# Patient Record
Sex: Female | Born: 1960
Health system: Southern US, Community
[De-identification: ages and names within clinical notes are randomized; demographics above are authoritative.]

## PROBLEM LIST (undated history)

## (undated) DIAGNOSIS — F32A Depression, unspecified: Secondary | ICD-10-CM

## (undated) DIAGNOSIS — I480 Paroxysmal atrial fibrillation: Secondary | ICD-10-CM

## (undated) DIAGNOSIS — D171 Benign lipomatous neoplasm of skin and subcutaneous tissue of trunk: Secondary | ICD-10-CM

## (undated) DIAGNOSIS — R55 Syncope and collapse: Secondary | ICD-10-CM

## (undated) DIAGNOSIS — I639 Cerebral infarction, unspecified: Secondary | ICD-10-CM

## (undated) DIAGNOSIS — N3010 Interstitial cystitis (chronic) without hematuria: Secondary | ICD-10-CM

## (undated) DIAGNOSIS — R112 Nausea with vomiting, unspecified: Secondary | ICD-10-CM

## (undated) DIAGNOSIS — IMO0001 Reserved for inherently not codable concepts without codable children: Secondary | ICD-10-CM

## (undated) DIAGNOSIS — E079 Disorder of thyroid, unspecified: Secondary | ICD-10-CM

## (undated) DIAGNOSIS — Z8679 Personal history of other diseases of the circulatory system: Secondary | ICD-10-CM

## (undated) DIAGNOSIS — I471 Supraventricular tachycardia, unspecified: Secondary | ICD-10-CM

## (undated) DIAGNOSIS — K219 Gastro-esophageal reflux disease without esophagitis: Secondary | ICD-10-CM

## (undated) DIAGNOSIS — R011 Cardiac murmur, unspecified: Secondary | ICD-10-CM

## (undated) DIAGNOSIS — K5909 Other constipation: Secondary | ICD-10-CM

## (undated) DIAGNOSIS — C50919 Malignant neoplasm of unspecified site of unspecified female breast: Secondary | ICD-10-CM

## (undated) DIAGNOSIS — E785 Hyperlipidemia, unspecified: Secondary | ICD-10-CM

## (undated) DIAGNOSIS — M1990 Unspecified osteoarthritis, unspecified site: Secondary | ICD-10-CM

## (undated) DIAGNOSIS — Z0389 Encounter for observation for other suspected diseases and conditions ruled out: Secondary | ICD-10-CM

## (undated) DIAGNOSIS — C50012 Malignant neoplasm of nipple and areola, left female breast: Secondary | ICD-10-CM

## (undated) DIAGNOSIS — F419 Anxiety disorder, unspecified: Secondary | ICD-10-CM

## (undated) DIAGNOSIS — Z86711 Personal history of pulmonary embolism: Secondary | ICD-10-CM

## (undated) DIAGNOSIS — E039 Hypothyroidism, unspecified: Secondary | ICD-10-CM

## (undated) DIAGNOSIS — T884XXA Failed or difficult intubation, initial encounter: Secondary | ICD-10-CM

## (undated) DIAGNOSIS — Z8489 Family history of other specified conditions: Secondary | ICD-10-CM

## (undated) DIAGNOSIS — D509 Iron deficiency anemia, unspecified: Secondary | ICD-10-CM

## (undated) DIAGNOSIS — G5600 Carpal tunnel syndrome, unspecified upper limb: Secondary | ICD-10-CM

## (undated) DIAGNOSIS — G540 Brachial plexus disorders: Secondary | ICD-10-CM

## (undated) DIAGNOSIS — J189 Pneumonia, unspecified organism: Secondary | ICD-10-CM

## (undated) DIAGNOSIS — D689 Coagulation defect, unspecified: Secondary | ICD-10-CM

## (undated) DIAGNOSIS — G894 Chronic pain syndrome: Secondary | ICD-10-CM

## (undated) DIAGNOSIS — Z5189 Encounter for other specified aftercare: Secondary | ICD-10-CM

## (undated) DIAGNOSIS — D649 Anemia, unspecified: Secondary | ICD-10-CM

## (undated) DIAGNOSIS — G43909 Migraine, unspecified, not intractable, without status migrainosus: Secondary | ICD-10-CM

## (undated) DIAGNOSIS — C801 Malignant (primary) neoplasm, unspecified: Secondary | ICD-10-CM

## (undated) DIAGNOSIS — Z8614 Personal history of Methicillin resistant Staphylococcus aureus infection: Secondary | ICD-10-CM

## (undated) DIAGNOSIS — F329 Major depressive disorder, single episode, unspecified: Secondary | ICD-10-CM

## (undated) DIAGNOSIS — K432 Incisional hernia without obstruction or gangrene: Secondary | ICD-10-CM

## (undated) DIAGNOSIS — R002 Palpitations: Secondary | ICD-10-CM

## (undated) DIAGNOSIS — Z8673 Personal history of transient ischemic attack (TIA), and cerebral infarction without residual deficits: Secondary | ICD-10-CM

## (undated) DIAGNOSIS — Z973 Presence of spectacles and contact lenses: Secondary | ICD-10-CM

## (undated) DIAGNOSIS — Z9889 Other specified postprocedural states: Secondary | ICD-10-CM

## (undated) DIAGNOSIS — T7840XA Allergy, unspecified, initial encounter: Secondary | ICD-10-CM

## (undated) DIAGNOSIS — M797 Fibromyalgia: Secondary | ICD-10-CM

## (undated) DIAGNOSIS — Z95828 Presence of other vascular implants and grafts: Secondary | ICD-10-CM

## (undated) DIAGNOSIS — Z8742 Personal history of other diseases of the female genital tract: Secondary | ICD-10-CM

## (undated) HISTORY — PX: HERNIA REPAIR: SHX51

## (undated) HISTORY — DX: Cerebral infarction, unspecified: I63.9

## (undated) HISTORY — PX: SPINE SURGERY: SHX786

## (undated) HISTORY — PX: FRACTURE SURGERY: SHX138

## (undated) HISTORY — DX: Anemia, unspecified: D64.9

## (undated) HISTORY — DX: Coagulation defect, unspecified: D68.9

## (undated) HISTORY — PX: CARDIOVASCULAR STRESS TEST: SHX262

## (undated) HISTORY — DX: Unspecified osteoarthritis, unspecified site: M19.90

## (undated) HISTORY — DX: Brachial plexus disorders: G54.0

## (undated) HISTORY — DX: Cardiac murmur, unspecified: R01.1

## (undated) HISTORY — PX: COLON SURGERY: SHX602

## (undated) HISTORY — DX: Encounter for other specified aftercare: Z51.89

## (undated) HISTORY — PX: OTHER SURGICAL HISTORY: SHX169

## (undated) HISTORY — DX: Syncope and collapse: R55

## (undated) HISTORY — PX: CARDIAC CATHETERIZATION: SHX172

## (undated) HISTORY — DX: Encounter for observation for other suspected diseases and conditions ruled out: Z03.89

## (undated) HISTORY — DX: Interstitial cystitis (chronic) without hematuria: N30.10

## (undated) HISTORY — PX: CARPAL TUNNEL RELEASE: SHX101

## (undated) HISTORY — DX: Allergy, unspecified, initial encounter: T78.40XA

## (undated) HISTORY — PX: CARDIAC ELECTROPHYSIOLOGY STUDY AND ABLATION: SHX1294

## (undated) HISTORY — PX: VENA CAVA FILTER PLACEMENT: SUR1032

## (undated) HISTORY — PX: ANTERIOR CERVICAL DECOMP/DISCECTOMY FUSION: SHX1161

## (undated) HISTORY — PX: TOTAL COLECTOMY: SHX852

## (undated) HISTORY — PX: SMALL INTESTINE SURGERY: SHX150

## (undated) HISTORY — DX: Carpal tunnel syndrome, unspecified upper limb: G56.00

## (undated) HISTORY — PX: BREAST IMPLANT REMOVAL: SUR1101

## (undated) HISTORY — PX: ENTEROCUTANEOUS FISTULA CLOSURE: SHX1510

## (undated) HISTORY — DX: Fibromyalgia: M79.7

## (undated) HISTORY — PX: CHOLECYSTECTOMY: SHX55

## (undated) HISTORY — DX: Reserved for inherently not codable concepts without codable children: IMO0001

## (undated) HISTORY — DX: Malignant neoplasm of unspecified site of unspecified female breast: C50.919

## (undated) HISTORY — PX: BREAST SURGERY: SHX581

## (undated) HISTORY — PX: TUBAL LIGATION: SHX77

## (undated) HISTORY — PX: JOINT REPLACEMENT: SHX530

## (undated) HISTORY — PX: COSMETIC SURGERY: SHX468

---
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
## 1985-12-28 HISTORY — PX: ABDOMINAL HYSTERECTOMY: SHX81

## 1991-12-29 HISTORY — PX: BREAST ENHANCEMENT SURGERY: SHX7

## 1994-12-28 DIAGNOSIS — Z8679 Personal history of other diseases of the circulatory system: Secondary | ICD-10-CM

## 1994-12-28 HISTORY — DX: Personal history of other diseases of the circulatory system: Z86.79

## 1995-12-29 DIAGNOSIS — Z86711 Personal history of pulmonary embolism: Secondary | ICD-10-CM

## 1995-12-29 DIAGNOSIS — Z8673 Personal history of transient ischemic attack (TIA), and cerebral infarction without residual deficits: Secondary | ICD-10-CM

## 1995-12-29 HISTORY — DX: Personal history of pulmonary embolism: Z86.711

## 1995-12-29 HISTORY — DX: Personal history of transient ischemic attack (TIA), and cerebral infarction without residual deficits: Z86.73

## 1998-04-01 ENCOUNTER — Inpatient Hospital Stay (HOSPITAL_COMMUNITY): Admission: RE | Admit: 1998-04-01 | Discharge: 1998-04-03 | Payer: Self-pay | Admitting: *Deleted

## 1999-05-01 ENCOUNTER — Ambulatory Visit (HOSPITAL_COMMUNITY): Admission: RE | Admit: 1999-05-01 | Discharge: 1999-05-01 | Payer: Self-pay | Admitting: Internal Medicine

## 1999-05-01 ENCOUNTER — Encounter: Payer: Self-pay | Admitting: Internal Medicine

## 1999-06-07 ENCOUNTER — Inpatient Hospital Stay (HOSPITAL_COMMUNITY): Admission: EM | Admit: 1999-06-07 | Discharge: 1999-06-14 | Payer: Self-pay | Admitting: Plastic Surgery

## 1999-06-09 ENCOUNTER — Encounter: Payer: Self-pay | Admitting: Plastic Surgery

## 1999-06-10 ENCOUNTER — Encounter: Payer: Self-pay | Admitting: Plastic Surgery

## 1999-06-19 ENCOUNTER — Encounter: Payer: Self-pay | Admitting: Internal Medicine

## 1999-06-19 ENCOUNTER — Inpatient Hospital Stay (HOSPITAL_COMMUNITY): Admission: EM | Admit: 1999-06-19 | Discharge: 1999-06-25 | Payer: Self-pay | Admitting: Emergency Medicine

## 1999-06-19 ENCOUNTER — Encounter: Payer: Self-pay | Admitting: Emergency Medicine

## 1999-06-24 ENCOUNTER — Encounter: Payer: Self-pay | Admitting: Internal Medicine

## 1999-06-24 ENCOUNTER — Encounter: Payer: Self-pay | Admitting: Urology

## 2000-11-30 ENCOUNTER — Ambulatory Visit (HOSPITAL_COMMUNITY): Admission: RE | Admit: 2000-11-30 | Discharge: 2000-11-30 | Payer: Self-pay | Admitting: Internal Medicine

## 2000-11-30 ENCOUNTER — Encounter (INDEPENDENT_AMBULATORY_CARE_PROVIDER_SITE_OTHER): Payer: Self-pay | Admitting: Specialist

## 2001-03-04 ENCOUNTER — Encounter: Payer: Self-pay | Admitting: Urology

## 2001-03-11 ENCOUNTER — Inpatient Hospital Stay (HOSPITAL_COMMUNITY): Admission: RE | Admit: 2001-03-11 | Discharge: 2001-03-14 | Payer: Self-pay | Admitting: Urology

## 2001-07-04 ENCOUNTER — Encounter (HOSPITAL_COMMUNITY): Admission: RE | Admit: 2001-07-04 | Discharge: 2001-08-03 | Payer: Self-pay | Admitting: Internal Medicine

## 2001-07-04 ENCOUNTER — Encounter (INDEPENDENT_AMBULATORY_CARE_PROVIDER_SITE_OTHER): Payer: Self-pay | Admitting: Internal Medicine

## 2001-07-05 ENCOUNTER — Encounter (INDEPENDENT_AMBULATORY_CARE_PROVIDER_SITE_OTHER): Payer: Self-pay | Admitting: Internal Medicine

## 2001-07-06 ENCOUNTER — Encounter (INDEPENDENT_AMBULATORY_CARE_PROVIDER_SITE_OTHER): Payer: Self-pay | Admitting: Internal Medicine

## 2001-07-07 ENCOUNTER — Encounter (INDEPENDENT_AMBULATORY_CARE_PROVIDER_SITE_OTHER): Payer: Self-pay | Admitting: Internal Medicine

## 2001-07-08 ENCOUNTER — Encounter (INDEPENDENT_AMBULATORY_CARE_PROVIDER_SITE_OTHER): Payer: Self-pay | Admitting: Internal Medicine

## 2001-07-27 ENCOUNTER — Encounter: Payer: Self-pay | Admitting: *Deleted

## 2001-07-27 ENCOUNTER — Observation Stay (HOSPITAL_COMMUNITY): Admission: EM | Admit: 2001-07-27 | Discharge: 2001-07-28 | Payer: Self-pay | Admitting: Emergency Medicine

## 2001-07-28 ENCOUNTER — Encounter: Payer: Self-pay | Admitting: Family Medicine

## 2001-07-28 ENCOUNTER — Inpatient Hospital Stay (HOSPITAL_COMMUNITY): Admission: AD | Admit: 2001-07-28 | Discharge: 2001-07-30 | Payer: Self-pay | Admitting: Internal Medicine

## 2001-08-30 ENCOUNTER — Ambulatory Visit (HOSPITAL_COMMUNITY): Admission: RE | Admit: 2001-08-30 | Discharge: 2001-08-30 | Payer: Self-pay | Admitting: Cardiology

## 2001-08-30 ENCOUNTER — Encounter: Payer: Self-pay | Admitting: Cardiology

## 2001-09-06 ENCOUNTER — Encounter: Payer: Self-pay | Admitting: Internal Medicine

## 2001-09-06 ENCOUNTER — Ambulatory Visit (HOSPITAL_COMMUNITY): Admission: RE | Admit: 2001-09-06 | Discharge: 2001-09-06 | Payer: Self-pay | Admitting: Internal Medicine

## 2001-09-07 ENCOUNTER — Other Ambulatory Visit: Admission: RE | Admit: 2001-09-07 | Discharge: 2001-09-07 | Payer: Self-pay | Admitting: Family Medicine

## 2001-09-22 ENCOUNTER — Ambulatory Visit (HOSPITAL_COMMUNITY): Admission: RE | Admit: 2001-09-22 | Discharge: 2001-09-22 | Payer: Self-pay | Admitting: Family Medicine

## 2001-09-22 ENCOUNTER — Encounter: Payer: Self-pay | Admitting: Family Medicine

## 2001-12-29 ENCOUNTER — Ambulatory Visit: Admission: RE | Admit: 2001-12-29 | Discharge: 2001-12-29 | Payer: Self-pay | Admitting: Family Medicine

## 2002-01-20 ENCOUNTER — Ambulatory Visit (HOSPITAL_COMMUNITY): Admission: RE | Admit: 2002-01-20 | Discharge: 2002-01-20 | Payer: Self-pay | Admitting: Endocrinology

## 2002-01-23 ENCOUNTER — Ambulatory Visit (HOSPITAL_COMMUNITY): Admission: RE | Admit: 2002-01-23 | Discharge: 2002-01-23 | Payer: Self-pay | Admitting: Family Medicine

## 2002-01-23 ENCOUNTER — Encounter: Payer: Self-pay | Admitting: Family Medicine

## 2002-02-07 ENCOUNTER — Encounter: Payer: Self-pay | Admitting: Family Medicine

## 2002-02-07 ENCOUNTER — Ambulatory Visit (HOSPITAL_COMMUNITY): Admission: RE | Admit: 2002-02-07 | Discharge: 2002-02-07 | Payer: Self-pay | Admitting: Family Medicine

## 2002-03-07 ENCOUNTER — Inpatient Hospital Stay (HOSPITAL_COMMUNITY): Admission: RE | Admit: 2002-03-07 | Discharge: 2002-03-08 | Payer: Self-pay | Admitting: Neurological Surgery

## 2002-03-07 ENCOUNTER — Encounter: Payer: Self-pay | Admitting: Neurological Surgery

## 2002-06-06 ENCOUNTER — Encounter: Payer: Self-pay | Admitting: Family Medicine

## 2002-06-06 ENCOUNTER — Ambulatory Visit (HOSPITAL_COMMUNITY): Admission: RE | Admit: 2002-06-06 | Discharge: 2002-06-06 | Payer: Self-pay | Admitting: Unknown Physician Specialty

## 2002-06-15 ENCOUNTER — Ambulatory Visit (HOSPITAL_COMMUNITY): Admission: RE | Admit: 2002-06-15 | Discharge: 2002-06-15 | Payer: Self-pay | Admitting: Internal Medicine

## 2002-06-19 ENCOUNTER — Encounter: Payer: Self-pay | Admitting: Family Medicine

## 2002-06-19 ENCOUNTER — Ambulatory Visit (HOSPITAL_COMMUNITY): Admission: RE | Admit: 2002-06-19 | Discharge: 2002-06-19 | Payer: Self-pay | Admitting: Family Medicine

## 2002-06-22 ENCOUNTER — Encounter: Admission: RE | Admit: 2002-06-22 | Discharge: 2002-06-22 | Payer: Self-pay | Admitting: Family Medicine

## 2002-06-22 ENCOUNTER — Encounter: Payer: Self-pay | Admitting: Family Medicine

## 2002-08-02 ENCOUNTER — Inpatient Hospital Stay (HOSPITAL_COMMUNITY): Admission: AD | Admit: 2002-08-02 | Discharge: 2002-08-15 | Payer: Self-pay | Admitting: General Surgery

## 2002-08-03 ENCOUNTER — Encounter: Payer: Self-pay | Admitting: General Surgery

## 2002-08-08 ENCOUNTER — Encounter: Payer: Self-pay | Admitting: Family Medicine

## 2002-08-24 ENCOUNTER — Ambulatory Visit (HOSPITAL_COMMUNITY): Admission: RE | Admit: 2002-08-24 | Discharge: 2002-08-24 | Payer: Self-pay | Admitting: Family Medicine

## 2002-08-24 ENCOUNTER — Encounter: Payer: Self-pay | Admitting: Family Medicine

## 2002-11-05 ENCOUNTER — Emergency Department (HOSPITAL_COMMUNITY): Admission: EM | Admit: 2002-11-05 | Discharge: 2002-11-05 | Payer: Self-pay | Admitting: Emergency Medicine

## 2002-11-07 ENCOUNTER — Encounter: Payer: Self-pay | Admitting: Neurological Surgery

## 2002-11-07 ENCOUNTER — Ambulatory Visit (HOSPITAL_COMMUNITY): Admission: RE | Admit: 2002-11-07 | Discharge: 2002-11-07 | Payer: Self-pay | Admitting: Neurological Surgery

## 2003-03-18 ENCOUNTER — Emergency Department (HOSPITAL_COMMUNITY): Admission: EM | Admit: 2003-03-18 | Discharge: 2003-03-18 | Payer: Self-pay | Admitting: Internal Medicine

## 2003-03-22 ENCOUNTER — Emergency Department (HOSPITAL_COMMUNITY): Admission: EM | Admit: 2003-03-22 | Discharge: 2003-03-22 | Payer: Self-pay | Admitting: Emergency Medicine

## 2003-03-22 ENCOUNTER — Ambulatory Visit (HOSPITAL_COMMUNITY): Admission: RE | Admit: 2003-03-22 | Discharge: 2003-03-22 | Payer: Self-pay | Admitting: Family Medicine

## 2003-03-22 ENCOUNTER — Encounter: Payer: Self-pay | Admitting: Family Medicine

## 2003-03-22 ENCOUNTER — Encounter: Payer: Self-pay | Admitting: Emergency Medicine

## 2003-03-26 ENCOUNTER — Other Ambulatory Visit: Admission: RE | Admit: 2003-03-26 | Discharge: 2003-03-26 | Payer: Self-pay | Admitting: Obstetrics & Gynecology

## 2003-05-07 ENCOUNTER — Encounter: Payer: Self-pay | Admitting: Family Medicine

## 2003-05-07 ENCOUNTER — Ambulatory Visit (HOSPITAL_COMMUNITY): Admission: RE | Admit: 2003-05-07 | Discharge: 2003-05-07 | Payer: Self-pay | Admitting: Family Medicine

## 2003-05-13 ENCOUNTER — Encounter: Payer: Self-pay | Admitting: Obstetrics & Gynecology

## 2003-05-13 ENCOUNTER — Inpatient Hospital Stay (HOSPITAL_COMMUNITY): Admission: AD | Admit: 2003-05-13 | Discharge: 2003-05-13 | Payer: Self-pay | Admitting: Obstetrics & Gynecology

## 2003-05-24 ENCOUNTER — Emergency Department (HOSPITAL_COMMUNITY): Admission: EM | Admit: 2003-05-24 | Discharge: 2003-05-24 | Payer: Self-pay | Admitting: Emergency Medicine

## 2003-05-24 ENCOUNTER — Encounter: Payer: Self-pay | Admitting: Emergency Medicine

## 2003-05-31 ENCOUNTER — Ambulatory Visit (HOSPITAL_COMMUNITY): Admission: RE | Admit: 2003-05-31 | Discharge: 2003-05-31 | Payer: Self-pay | Admitting: Internal Medicine

## 2003-05-31 ENCOUNTER — Encounter (INDEPENDENT_AMBULATORY_CARE_PROVIDER_SITE_OTHER): Payer: Self-pay | Admitting: Internal Medicine

## 2003-06-05 ENCOUNTER — Encounter: Payer: Self-pay | Admitting: Family Medicine

## 2003-06-05 ENCOUNTER — Ambulatory Visit (HOSPITAL_COMMUNITY): Admission: RE | Admit: 2003-06-05 | Discharge: 2003-06-05 | Payer: Self-pay | Admitting: Family Medicine

## 2003-06-08 ENCOUNTER — Ambulatory Visit (HOSPITAL_COMMUNITY): Admission: RE | Admit: 2003-06-08 | Discharge: 2003-06-08 | Payer: Self-pay | Admitting: Internal Medicine

## 2003-06-08 ENCOUNTER — Encounter (INDEPENDENT_AMBULATORY_CARE_PROVIDER_SITE_OTHER): Payer: Self-pay | Admitting: Internal Medicine

## 2003-06-11 ENCOUNTER — Encounter (HOSPITAL_COMMUNITY): Admission: RE | Admit: 2003-06-11 | Discharge: 2003-07-11 | Payer: Self-pay | Admitting: *Deleted

## 2003-06-11 ENCOUNTER — Encounter: Payer: Self-pay | Admitting: *Deleted

## 2003-07-03 ENCOUNTER — Inpatient Hospital Stay (HOSPITAL_COMMUNITY): Admission: AD | Admit: 2003-07-03 | Discharge: 2003-07-10 | Payer: Self-pay | Admitting: Internal Medicine

## 2003-07-04 ENCOUNTER — Encounter: Payer: Self-pay | Admitting: Internal Medicine

## 2003-07-06 ENCOUNTER — Encounter: Payer: Self-pay | Admitting: Family Medicine

## 2003-07-17 ENCOUNTER — Emergency Department (HOSPITAL_COMMUNITY): Admission: EM | Admit: 2003-07-17 | Discharge: 2003-07-17 | Payer: Self-pay | Admitting: Emergency Medicine

## 2003-07-17 ENCOUNTER — Encounter: Payer: Self-pay | Admitting: Emergency Medicine

## 2003-08-29 ENCOUNTER — Ambulatory Visit (HOSPITAL_COMMUNITY): Admission: RE | Admit: 2003-08-29 | Discharge: 2003-08-29 | Payer: Self-pay | Admitting: Obstetrics & Gynecology

## 2003-08-29 ENCOUNTER — Encounter: Payer: Self-pay | Admitting: Obstetrics & Gynecology

## 2004-04-23 ENCOUNTER — Ambulatory Visit (HOSPITAL_COMMUNITY): Admission: RE | Admit: 2004-04-23 | Discharge: 2004-04-23 | Payer: Self-pay | Admitting: Family Medicine

## 2004-05-15 ENCOUNTER — Encounter: Admission: RE | Admit: 2004-05-15 | Discharge: 2004-05-15 | Payer: Self-pay | Admitting: Family Medicine

## 2004-06-02 ENCOUNTER — Encounter: Admission: RE | Admit: 2004-06-02 | Discharge: 2004-06-02 | Payer: Self-pay | Admitting: Family Medicine

## 2004-06-05 ENCOUNTER — Other Ambulatory Visit: Admission: RE | Admit: 2004-06-05 | Discharge: 2004-06-05 | Payer: Self-pay | Admitting: Obstetrics & Gynecology

## 2004-07-09 ENCOUNTER — Encounter (HOSPITAL_COMMUNITY): Admission: RE | Admit: 2004-07-09 | Discharge: 2004-07-15 | Payer: Self-pay | Admitting: Family Medicine

## 2004-07-19 ENCOUNTER — Emergency Department (HOSPITAL_COMMUNITY): Admission: EM | Admit: 2004-07-19 | Discharge: 2004-07-20 | Payer: Self-pay | Admitting: Emergency Medicine

## 2004-07-19 ENCOUNTER — Emergency Department (HOSPITAL_COMMUNITY): Admission: EM | Admit: 2004-07-19 | Discharge: 2004-07-19 | Payer: Self-pay | Admitting: Emergency Medicine

## 2004-07-20 ENCOUNTER — Inpatient Hospital Stay (HOSPITAL_COMMUNITY): Admission: EM | Admit: 2004-07-20 | Discharge: 2004-07-29 | Payer: Self-pay | Admitting: Emergency Medicine

## 2004-07-26 ENCOUNTER — Encounter: Payer: Self-pay | Admitting: Internal Medicine

## 2004-08-12 ENCOUNTER — Emergency Department (HOSPITAL_COMMUNITY): Admission: EM | Admit: 2004-08-12 | Discharge: 2004-08-12 | Payer: Self-pay | Admitting: Emergency Medicine

## 2004-08-13 ENCOUNTER — Emergency Department (HOSPITAL_COMMUNITY): Admission: EM | Admit: 2004-08-13 | Discharge: 2004-08-13 | Payer: Self-pay | Admitting: Emergency Medicine

## 2004-08-13 ENCOUNTER — Encounter: Admission: RE | Admit: 2004-08-13 | Discharge: 2004-08-13 | Payer: Self-pay | Admitting: Internal Medicine

## 2004-09-17 ENCOUNTER — Ambulatory Visit (HOSPITAL_COMMUNITY): Admission: RE | Admit: 2004-09-17 | Discharge: 2004-09-17 | Payer: Self-pay | Admitting: Family Medicine

## 2004-11-06 ENCOUNTER — Encounter: Admission: RE | Admit: 2004-11-06 | Discharge: 2004-11-06 | Payer: Self-pay | Admitting: Obstetrics and Gynecology

## 2004-11-23 ENCOUNTER — Emergency Department (HOSPITAL_COMMUNITY): Admission: EM | Admit: 2004-11-23 | Discharge: 2004-11-23 | Payer: Self-pay | Admitting: Emergency Medicine

## 2004-11-24 ENCOUNTER — Emergency Department (HOSPITAL_COMMUNITY): Admission: EM | Admit: 2004-11-24 | Discharge: 2004-11-24 | Payer: Self-pay | Admitting: Emergency Medicine

## 2004-11-27 ENCOUNTER — Emergency Department (HOSPITAL_COMMUNITY): Admission: EM | Admit: 2004-11-27 | Discharge: 2004-11-27 | Payer: Self-pay | Admitting: Emergency Medicine

## 2004-12-01 ENCOUNTER — Ambulatory Visit: Payer: Self-pay | Admitting: Family Medicine

## 2004-12-02 ENCOUNTER — Ambulatory Visit: Payer: Self-pay | Admitting: Infectious Diseases

## 2004-12-28 HISTORY — PX: AUGMENTATION MAMMAPLASTY: SUR837

## 2004-12-28 HISTORY — PX: MASTECTOMY: SHX3

## 2005-01-01 ENCOUNTER — Ambulatory Visit: Payer: Self-pay | Admitting: Internal Medicine

## 2005-01-02 ENCOUNTER — Ambulatory Visit: Payer: Self-pay | Admitting: *Deleted

## 2005-01-11 ENCOUNTER — Emergency Department (HOSPITAL_COMMUNITY): Admission: EM | Admit: 2005-01-11 | Discharge: 2005-01-11 | Payer: Self-pay | Admitting: Emergency Medicine

## 2005-01-19 ENCOUNTER — Ambulatory Visit: Payer: Self-pay | Admitting: Family Medicine

## 2005-03-05 ENCOUNTER — Ambulatory Visit: Payer: Self-pay | Admitting: *Deleted

## 2005-03-05 ENCOUNTER — Ambulatory Visit: Payer: Self-pay | Admitting: Family Medicine

## 2005-03-09 ENCOUNTER — Ambulatory Visit: Payer: Self-pay | Admitting: *Deleted

## 2005-03-10 ENCOUNTER — Ambulatory Visit: Payer: Self-pay | Admitting: Cardiovascular Disease

## 2005-03-12 ENCOUNTER — Ambulatory Visit: Payer: Self-pay | Admitting: Cardiology

## 2005-03-13 ENCOUNTER — Ambulatory Visit: Payer: Self-pay | Admitting: Cardiology

## 2005-03-17 ENCOUNTER — Ambulatory Visit: Payer: Self-pay | Admitting: Cardiology

## 2005-03-30 ENCOUNTER — Ambulatory Visit: Payer: Self-pay | Admitting: Family Medicine

## 2005-03-31 ENCOUNTER — Ambulatory Visit (HOSPITAL_COMMUNITY): Admission: RE | Admit: 2005-03-31 | Discharge: 2005-03-31 | Payer: Self-pay | Admitting: Family Medicine

## 2005-04-16 ENCOUNTER — Ambulatory Visit: Payer: Self-pay | Admitting: *Deleted

## 2005-04-20 ENCOUNTER — Encounter: Admission: RE | Admit: 2005-04-20 | Discharge: 2005-04-20 | Payer: Self-pay | Admitting: Oncology

## 2005-04-20 ENCOUNTER — Ambulatory Visit (HOSPITAL_COMMUNITY): Admission: RE | Admit: 2005-04-20 | Discharge: 2005-04-20 | Payer: Self-pay | Admitting: Family Medicine

## 2005-04-20 ENCOUNTER — Encounter (HOSPITAL_COMMUNITY): Admission: RE | Admit: 2005-04-20 | Discharge: 2005-05-20 | Payer: Self-pay | Admitting: Oncology

## 2005-04-20 ENCOUNTER — Ambulatory Visit (HOSPITAL_COMMUNITY): Payer: Self-pay | Admitting: Oncology

## 2005-04-27 ENCOUNTER — Ambulatory Visit: Payer: Self-pay | Admitting: *Deleted

## 2005-05-08 DIAGNOSIS — Z95828 Presence of other vascular implants and grafts: Secondary | ICD-10-CM

## 2005-05-08 HISTORY — DX: Presence of other vascular implants and grafts: Z95.828

## 2005-05-18 ENCOUNTER — Ambulatory Visit: Payer: Self-pay | Admitting: *Deleted

## 2005-06-04 ENCOUNTER — Ambulatory Visit: Payer: Self-pay | Admitting: Family Medicine

## 2005-07-06 ENCOUNTER — Encounter: Admission: RE | Admit: 2005-07-06 | Discharge: 2005-07-06 | Payer: Self-pay | Admitting: Oncology

## 2005-07-06 ENCOUNTER — Encounter (HOSPITAL_COMMUNITY): Admission: RE | Admit: 2005-07-06 | Discharge: 2005-08-05 | Payer: Self-pay | Admitting: Oncology

## 2005-07-06 ENCOUNTER — Ambulatory Visit (HOSPITAL_COMMUNITY): Payer: Self-pay | Admitting: Oncology

## 2005-07-09 ENCOUNTER — Ambulatory Visit: Payer: Self-pay | Admitting: Family Medicine

## 2005-08-02 ENCOUNTER — Emergency Department (HOSPITAL_COMMUNITY): Admission: EM | Admit: 2005-08-02 | Discharge: 2005-08-02 | Payer: Self-pay | Admitting: Emergency Medicine

## 2005-08-26 ENCOUNTER — Ambulatory Visit: Payer: Self-pay | Admitting: Family Medicine

## 2005-08-27 ENCOUNTER — Ambulatory Visit: Payer: Self-pay | Admitting: Orthopedic Surgery

## 2005-09-21 ENCOUNTER — Inpatient Hospital Stay (HOSPITAL_COMMUNITY): Admission: EM | Admit: 2005-09-21 | Discharge: 2005-09-26 | Payer: Self-pay | Admitting: Emergency Medicine

## 2005-09-22 ENCOUNTER — Encounter (INDEPENDENT_AMBULATORY_CARE_PROVIDER_SITE_OTHER): Payer: Self-pay | Admitting: General Surgery

## 2005-10-09 ENCOUNTER — Ambulatory Visit: Payer: Self-pay | Admitting: Family Medicine

## 2005-10-27 ENCOUNTER — Ambulatory Visit: Payer: Self-pay | Admitting: Family Medicine

## 2005-11-02 ENCOUNTER — Ambulatory Visit: Payer: Self-pay | Admitting: Family Medicine

## 2005-11-30 ENCOUNTER — Ambulatory Visit: Payer: Self-pay | Admitting: Family Medicine

## 2005-12-14 ENCOUNTER — Ambulatory Visit: Payer: Self-pay | Admitting: Infectious Diseases

## 2005-12-15 ENCOUNTER — Encounter: Admission: RE | Admit: 2005-12-15 | Discharge: 2005-12-15 | Payer: Self-pay | Admitting: Family Medicine

## 2005-12-18 ENCOUNTER — Ambulatory Visit: Payer: Self-pay | Admitting: Family Medicine

## 2006-02-10 ENCOUNTER — Emergency Department (HOSPITAL_COMMUNITY): Admission: EM | Admit: 2006-02-10 | Discharge: 2006-02-10 | Payer: Self-pay | Admitting: Emergency Medicine

## 2006-04-06 ENCOUNTER — Ambulatory Visit: Payer: Self-pay | Admitting: Family Medicine

## 2006-04-06 ENCOUNTER — Encounter: Payer: Self-pay | Admitting: Family Medicine

## 2006-04-06 ENCOUNTER — Other Ambulatory Visit: Admission: RE | Admit: 2006-04-06 | Discharge: 2006-04-06 | Payer: Self-pay | Admitting: Family Medicine

## 2006-04-15 ENCOUNTER — Ambulatory Visit: Payer: Self-pay | Admitting: Cardiology

## 2006-04-28 ENCOUNTER — Encounter: Payer: Self-pay | Admitting: Emergency Medicine

## 2006-05-04 ENCOUNTER — Ambulatory Visit: Payer: Self-pay | Admitting: *Deleted

## 2006-05-18 ENCOUNTER — Ambulatory Visit: Payer: Self-pay | Admitting: Family Medicine

## 2006-05-21 ENCOUNTER — Ambulatory Visit (HOSPITAL_COMMUNITY): Admission: RE | Admit: 2006-05-21 | Discharge: 2006-05-21 | Payer: Self-pay | Admitting: Family Medicine

## 2006-05-22 ENCOUNTER — Emergency Department (HOSPITAL_COMMUNITY): Admission: EM | Admit: 2006-05-22 | Discharge: 2006-05-22 | Payer: Self-pay | Admitting: Emergency Medicine

## 2006-06-02 ENCOUNTER — Ambulatory Visit: Payer: Self-pay | Admitting: Family Medicine

## 2006-08-12 ENCOUNTER — Ambulatory Visit: Payer: Self-pay | Admitting: Family Medicine

## 2006-08-19 ENCOUNTER — Ambulatory Visit (HOSPITAL_COMMUNITY): Admission: RE | Admit: 2006-08-19 | Discharge: 2006-08-19 | Payer: Self-pay | Admitting: Family Medicine

## 2006-09-03 ENCOUNTER — Ambulatory Visit: Payer: Self-pay | Admitting: Family Medicine

## 2006-09-20 ENCOUNTER — Ambulatory Visit: Payer: Self-pay | Admitting: Family Medicine

## 2006-11-22 ENCOUNTER — Ambulatory Visit: Payer: Self-pay | Admitting: Family Medicine

## 2006-12-16 ENCOUNTER — Ambulatory Visit: Payer: Self-pay | Admitting: Internal Medicine

## 2007-01-06 ENCOUNTER — Ambulatory Visit: Payer: Self-pay | Admitting: Family Medicine

## 2007-01-06 LAB — CONVERTED CEMR LAB
Albumin: 4 g/dL (ref 3.5–5.2)
Basophils Absolute: 0 10*3/uL (ref 0.0–0.1)
CO2: 26 meq/L (ref 19–32)
Chloride: 105 meq/L (ref 96–112)
Ferritin: 7 ng/mL — ABNORMAL LOW (ref 10–291)
Folate: 8.5 ng/mL
HDL: 30 mg/dL — ABNORMAL LOW (ref >39)
Hemoglobin: 12.1 g/dL (ref 12.0–15.0)
Lymphocytes Relative: 35 % (ref 12–46)
Lymphs Abs: 2.2 10*3/uL (ref 0.7–3.3)
Monocytes Absolute: 0.5 10*3/uL (ref 0.2–0.7)
Neutro Abs: 3.5 10*3/uL (ref 1.7–7.7)
Platelets: 241 10*3/uL (ref 150–400)
RDW: 15.2 % — ABNORMAL HIGH (ref 11.5–14.0)
Retic Ct Pct: 1.5 % (ref 0.4–3.1)
Sodium: 139 meq/L (ref 135–145)
TIBC: 336 ug/dL (ref 250–470)
TSH: 2.849 microintl units/mL (ref 0.350–5.50)
Total Bilirubin: 0.2 mg/dL — ABNORMAL LOW (ref 0.3–1.2)
Total CHOL/HDL Ratio: 5.8
WBC: 6.4 10*3/uL (ref 4.0–10.5)

## 2007-01-07 ENCOUNTER — Encounter: Payer: Self-pay | Admitting: Family Medicine

## 2007-01-25 ENCOUNTER — Encounter: Admission: RE | Admit: 2007-01-25 | Discharge: 2007-01-25 | Payer: Self-pay | Admitting: Internal Medicine

## 2007-04-20 ENCOUNTER — Ambulatory Visit: Payer: Self-pay | Admitting: Family Medicine

## 2007-04-22 ENCOUNTER — Encounter: Payer: Self-pay | Admitting: Family Medicine

## 2007-04-22 LAB — CONVERTED CEMR LAB
Candida species: NEGATIVE
Chlamydia, DNA Probe: NEGATIVE
GC Probe Amp, Genital: NEGATIVE

## 2007-04-25 ENCOUNTER — Encounter: Payer: Self-pay | Admitting: Family Medicine

## 2007-04-25 LAB — CONVERTED CEMR LAB
ALT: 22 units/L (ref 0–35)
BUN: 13 mg/dL (ref 6–23)
Basophils Relative: 0 % (ref 0–1)
Cholesterol: 134 mg/dL (ref 0–200)
Eosinophils Absolute: 0.1 10*3/uL (ref 0.0–0.7)
MCHC: 30.8 g/dL (ref 30.0–36.0)
MCV: 81.6 fL (ref 78.0–100.0)
Monocytes Relative: 7 % (ref 3–11)
Neutrophils Relative %: 55 % (ref 43–77)
Platelets: 267 10*3/uL (ref 150–400)
Potassium: 4.2 meq/L (ref 3.5–5.3)
Sodium: 143 meq/L (ref 135–145)
Total Protein: 6.5 g/dL (ref 6.0–8.3)
Triglycerides: 252 mg/dL — ABNORMAL HIGH (ref <150)
VLDL: 50 mg/dL — ABNORMAL HIGH (ref 0–40)

## 2007-04-26 ENCOUNTER — Encounter: Payer: Self-pay | Admitting: Family Medicine

## 2007-04-26 LAB — CONVERTED CEMR LAB
Ferritin: 6 ng/mL — ABNORMAL LOW (ref 10–291)
Folate: 16.7 ng/mL
Retic Ct Pct: 0.8 % (ref 0.4–3.1)
Saturation Ratios: 8 % — ABNORMAL LOW (ref 20–55)
TIBC: 349 ug/dL (ref 250–470)
Vitamin B-12: 361 pg/mL (ref 211–911)

## 2007-05-25 ENCOUNTER — Emergency Department (HOSPITAL_COMMUNITY): Admission: EM | Admit: 2007-05-25 | Discharge: 2007-05-25 | Payer: Self-pay | Admitting: Emergency Medicine

## 2007-05-27 ENCOUNTER — Ambulatory Visit: Payer: Self-pay | Admitting: Cardiology

## 2007-05-30 ENCOUNTER — Ambulatory Visit: Payer: Self-pay | Admitting: Family Medicine

## 2007-05-31 ENCOUNTER — Encounter: Payer: Self-pay | Admitting: Family Medicine

## 2007-05-31 LAB — CONVERTED CEMR LAB
Ferritin: 4 ng/mL — ABNORMAL LOW (ref 10–291)
Folate: 14.2 ng/mL
Helicobacter Pylori Antibody-IgG: <0.4
Lymphs Abs: 2.1 10*3/uL (ref 0.7–3.3)
Monocytes Relative: 8 % (ref 3–11)
Neutro Abs: 4.8 10*3/uL (ref 1.7–7.7)
Neutrophils Relative %: 63 % (ref 43–77)
RBC: 4.93 M/uL (ref 3.87–5.11)
Vitamin B-12: >2000 pg/mL — ABNORMAL HIGH (ref 211–911)
WBC: 7.7 10*3/uL (ref 4.0–10.5)

## 2007-06-23 ENCOUNTER — Ambulatory Visit: Payer: Self-pay | Admitting: Family Medicine

## 2007-06-28 ENCOUNTER — Encounter: Payer: Self-pay | Admitting: Family Medicine

## 2007-06-28 LAB — CONVERTED CEMR LAB
CO2: 28 meq/L (ref 19–32)
Glucose, Bld: 115 mg/dL — ABNORMAL HIGH (ref 70–99)
Potassium: 4.7 meq/L (ref 3.5–5.3)
Sodium: 139 meq/L (ref 135–145)

## 2007-07-07 ENCOUNTER — Ambulatory Visit: Payer: Self-pay | Admitting: Internal Medicine

## 2007-07-11 ENCOUNTER — Ambulatory Visit: Payer: Self-pay | Admitting: Internal Medicine

## 2007-07-13 ENCOUNTER — Encounter: Payer: Self-pay | Admitting: Internal Medicine

## 2007-07-13 ENCOUNTER — Ambulatory Visit (HOSPITAL_COMMUNITY): Admission: RE | Admit: 2007-07-13 | Discharge: 2007-07-13 | Payer: Self-pay | Admitting: Internal Medicine

## 2007-07-13 ENCOUNTER — Ambulatory Visit: Payer: Self-pay | Admitting: Internal Medicine

## 2007-07-26 ENCOUNTER — Ambulatory Visit (HOSPITAL_COMMUNITY): Admission: RE | Admit: 2007-07-26 | Discharge: 2007-07-26 | Payer: Self-pay | Admitting: Internal Medicine

## 2007-07-27 ENCOUNTER — Ambulatory Visit (HOSPITAL_COMMUNITY): Admission: RE | Admit: 2007-07-27 | Discharge: 2007-07-27 | Payer: Self-pay | Admitting: Internal Medicine

## 2007-07-29 ENCOUNTER — Ambulatory Visit (HOSPITAL_COMMUNITY): Admission: RE | Admit: 2007-07-29 | Discharge: 2007-07-29 | Payer: Self-pay | Admitting: Internal Medicine

## 2007-08-01 ENCOUNTER — Ambulatory Visit (HOSPITAL_COMMUNITY): Admission: RE | Admit: 2007-08-01 | Discharge: 2007-08-01 | Payer: Self-pay | Admitting: Internal Medicine

## 2007-08-02 ENCOUNTER — Ambulatory Visit (HOSPITAL_COMMUNITY): Admission: RE | Admit: 2007-08-02 | Discharge: 2007-08-02 | Payer: Self-pay | Admitting: Internal Medicine

## 2007-08-03 ENCOUNTER — Ambulatory Visit: Payer: Self-pay | Admitting: Family Medicine

## 2007-08-03 LAB — CONVERTED CEMR LAB
Eosinophils Absolute: 0.1 10*3/uL (ref 0.0–0.7)
Eosinophils Relative: 1 % (ref 0–5)
Folate: >20 ng/mL
HCT: 39.2 % (ref 36.0–46.0)
Hemoglobin, Urine: NEGATIVE
Hemoglobin: 13.1 g/dL (ref 12.0–15.0)
Iron: 144 ug/dL (ref 42–145)
Ketones, ur: NEGATIVE mg/dL
Leukocytes, UA: NEGATIVE
Lymphs Abs: 2.3 10*3/uL (ref 0.7–3.3)
MCV: 82.1 fL (ref 78.0–100.0)
Monocytes Absolute: 0.6 10*3/uL (ref 0.2–0.7)
Platelets: 226 10*3/uL (ref 150–400)
Protein, ur: NEGATIVE mg/dL
RDW: 19 % — ABNORMAL HIGH (ref 11.5–14.0)
Saturation Ratios: 39 % (ref 20–55)
TIBC: 373 ug/dL (ref 250–470)
UIBC: 229 ug/dL
Urine Glucose: NEGATIVE mg/dL
Vitamin B-12: 450 pg/mL (ref 211–911)
WBC: 7.2 10*3/uL (ref 4.0–10.5)
pH: 6.5 (ref 5.0–8.0)

## 2007-08-05 ENCOUNTER — Ambulatory Visit (HOSPITAL_COMMUNITY): Admission: RE | Admit: 2007-08-05 | Discharge: 2007-08-05 | Payer: Self-pay | Admitting: Gastroenterology

## 2007-08-18 ENCOUNTER — Ambulatory Visit: Payer: Self-pay | Admitting: Internal Medicine

## 2007-08-18 ENCOUNTER — Ambulatory Visit (HOSPITAL_COMMUNITY): Admission: RE | Admit: 2007-08-18 | Discharge: 2007-08-18 | Payer: Self-pay | Admitting: Internal Medicine

## 2007-08-26 ENCOUNTER — Ambulatory Visit (HOSPITAL_COMMUNITY): Admission: RE | Admit: 2007-08-26 | Discharge: 2007-08-26 | Payer: Self-pay | Admitting: Internal Medicine

## 2007-09-05 ENCOUNTER — Ambulatory Visit: Payer: Self-pay | Admitting: Family Medicine

## 2007-09-05 LAB — CONVERTED CEMR LAB
Bilirubin Urine: NEGATIVE
Hemoglobin, Urine: NEGATIVE
Leukocytes, UA: NEGATIVE
RBC / HPF: NONE SEEN (ref <3)
Urine Glucose: NEGATIVE mg/dL
pH: 6 (ref 5.0–8.0)

## 2007-09-06 ENCOUNTER — Encounter: Payer: Self-pay | Admitting: Family Medicine

## 2007-09-13 ENCOUNTER — Ambulatory Visit: Payer: Self-pay | Admitting: Family Medicine

## 2007-09-21 ENCOUNTER — Ambulatory Visit (HOSPITAL_COMMUNITY): Admission: RE | Admit: 2007-09-21 | Discharge: 2007-09-21 | Payer: Self-pay | Admitting: Family Medicine

## 2007-11-16 ENCOUNTER — Ambulatory Visit: Payer: Self-pay | Admitting: Family Medicine

## 2007-11-18 ENCOUNTER — Encounter: Payer: Self-pay | Admitting: Family Medicine

## 2007-11-22 ENCOUNTER — Encounter: Payer: Self-pay | Admitting: Family Medicine

## 2007-11-22 LAB — CONVERTED CEMR LAB
AST: 14 units/L (ref 0–37)
Albumin: 4.4 g/dL (ref 3.5–5.2)
Alkaline Phosphatase: 82 units/L (ref 39–117)
Chloride: 104 meq/L (ref 96–112)
Creatinine, Ser: 0.86 mg/dL (ref 0.40–1.20)
HDL: 35 mg/dL — ABNORMAL LOW (ref >39)
LDL Cholesterol: 96 mg/dL (ref 0–99)
Sodium: 139 meq/L (ref 135–145)
Total Protein: 6.9 g/dL (ref 6.0–8.3)
Triglycerides: 217 mg/dL — ABNORMAL HIGH (ref <150)

## 2007-12-29 ENCOUNTER — Encounter: Payer: Self-pay | Admitting: Family Medicine

## 2008-01-05 ENCOUNTER — Ambulatory Visit: Payer: Self-pay | Admitting: Family Medicine

## 2008-02-02 ENCOUNTER — Encounter: Admission: RE | Admit: 2008-02-02 | Discharge: 2008-02-02 | Payer: Self-pay | Admitting: Family Medicine

## 2008-03-09 ENCOUNTER — Ambulatory Visit: Payer: Self-pay | Admitting: Family Medicine

## 2008-03-16 ENCOUNTER — Encounter: Payer: Self-pay | Admitting: Family Medicine

## 2008-03-16 DIAGNOSIS — M549 Dorsalgia, unspecified: Secondary | ICD-10-CM | POA: Insufficient documentation

## 2008-03-16 DIAGNOSIS — R5383 Other fatigue: Secondary | ICD-10-CM

## 2008-03-16 DIAGNOSIS — R52 Pain, unspecified: Secondary | ICD-10-CM | POA: Insufficient documentation

## 2008-03-16 DIAGNOSIS — F411 Generalized anxiety disorder: Secondary | ICD-10-CM

## 2008-03-16 DIAGNOSIS — M542 Cervicalgia: Secondary | ICD-10-CM

## 2008-03-16 DIAGNOSIS — C50012 Malignant neoplasm of nipple and areola, left female breast: Secondary | ICD-10-CM

## 2008-03-16 DIAGNOSIS — R5381 Other malaise: Secondary | ICD-10-CM

## 2008-03-16 DIAGNOSIS — E785 Hyperlipidemia, unspecified: Secondary | ICD-10-CM | POA: Insufficient documentation

## 2008-03-16 DIAGNOSIS — IMO0001 Reserved for inherently not codable concepts without codable children: Secondary | ICD-10-CM

## 2008-03-16 DIAGNOSIS — E669 Obesity, unspecified: Secondary | ICD-10-CM

## 2008-03-16 DIAGNOSIS — M797 Fibromyalgia: Secondary | ICD-10-CM | POA: Insufficient documentation

## 2008-03-16 DIAGNOSIS — E039 Hypothyroidism, unspecified: Secondary | ICD-10-CM | POA: Insufficient documentation

## 2008-03-16 HISTORY — DX: Other malaise: R53.81

## 2008-03-16 HISTORY — DX: Cervicalgia: M54.2

## 2008-03-16 HISTORY — DX: Obesity, unspecified: E66.9

## 2008-03-16 HISTORY — DX: Hyperlipidemia, unspecified: E78.5

## 2008-03-16 HISTORY — DX: Other malaise: R53.83

## 2008-03-16 HISTORY — DX: Reserved for inherently not codable concepts without codable children: IMO0001

## 2008-03-16 HISTORY — DX: Generalized anxiety disorder: F41.1

## 2008-03-16 HISTORY — DX: Malignant neoplasm of nipple and areola, left female breast: C50.012

## 2008-04-04 ENCOUNTER — Ambulatory Visit: Payer: Self-pay | Admitting: Family Medicine

## 2008-04-05 ENCOUNTER — Encounter: Payer: Self-pay | Admitting: Family Medicine

## 2008-04-05 LAB — CONVERTED CEMR LAB
ALT: 12 units/L (ref 0–35)
Alkaline Phosphatase: 89 units/L (ref 39–117)
Folate: 18.2 ng/mL
Total Protein: 7.3 g/dL (ref 6.0–8.3)

## 2008-04-11 ENCOUNTER — Ambulatory Visit (HOSPITAL_COMMUNITY): Admission: RE | Admit: 2008-04-11 | Discharge: 2008-04-11 | Payer: Self-pay | Admitting: Family Medicine

## 2008-04-11 ENCOUNTER — Encounter: Payer: Self-pay | Admitting: Family Medicine

## 2008-04-27 ENCOUNTER — Ambulatory Visit (HOSPITAL_COMMUNITY): Payer: Self-pay | Admitting: Oncology

## 2008-06-11 ENCOUNTER — Ambulatory Visit: Payer: Self-pay | Admitting: Cardiology

## 2008-06-11 ENCOUNTER — Ambulatory Visit: Payer: Self-pay | Admitting: Family Medicine

## 2008-06-12 ENCOUNTER — Encounter: Payer: Self-pay | Admitting: Family Medicine

## 2008-07-02 ENCOUNTER — Ambulatory Visit (HOSPITAL_COMMUNITY): Payer: Self-pay | Admitting: Internal Medicine

## 2008-07-02 ENCOUNTER — Encounter (HOSPITAL_COMMUNITY): Admission: RE | Admit: 2008-07-02 | Discharge: 2008-08-01 | Payer: Self-pay | Admitting: Internal Medicine

## 2008-07-04 ENCOUNTER — Ambulatory Visit (HOSPITAL_COMMUNITY): Payer: Self-pay | Admitting: Oncology

## 2008-07-09 ENCOUNTER — Ambulatory Visit (HOSPITAL_COMMUNITY): Payer: Self-pay | Admitting: Internal Medicine

## 2008-07-11 ENCOUNTER — Ambulatory Visit: Payer: Self-pay | Admitting: Family Medicine

## 2008-07-11 ENCOUNTER — Other Ambulatory Visit: Admission: RE | Admit: 2008-07-11 | Discharge: 2008-07-11 | Payer: Self-pay | Admitting: Family Medicine

## 2008-07-11 ENCOUNTER — Encounter: Payer: Self-pay | Admitting: Family Medicine

## 2008-07-11 LAB — CONVERTED CEMR LAB: Pap Smear: NORMAL

## 2008-07-12 ENCOUNTER — Encounter: Payer: Self-pay | Admitting: Family Medicine

## 2008-07-12 LAB — CONVERTED CEMR LAB
Candida species: NEGATIVE
Gardnerella vaginalis: NEGATIVE
Trichomonal Vaginitis: NEGATIVE

## 2008-07-16 ENCOUNTER — Ambulatory Visit (HOSPITAL_COMMUNITY): Admission: RE | Admit: 2008-07-16 | Discharge: 2008-07-16 | Payer: Self-pay | Admitting: Family Medicine

## 2008-07-18 ENCOUNTER — Encounter: Payer: Self-pay | Admitting: Family Medicine

## 2008-07-26 ENCOUNTER — Encounter: Payer: Self-pay | Admitting: Family Medicine

## 2008-07-29 ENCOUNTER — Emergency Department (HOSPITAL_COMMUNITY): Admission: EM | Admit: 2008-07-29 | Discharge: 2008-07-29 | Payer: Self-pay | Admitting: Emergency Medicine

## 2008-07-30 ENCOUNTER — Telehealth: Payer: Self-pay | Admitting: Family Medicine

## 2008-07-31 ENCOUNTER — Ambulatory Visit: Payer: Self-pay | Admitting: Family Medicine

## 2008-07-31 ENCOUNTER — Telehealth: Payer: Self-pay | Admitting: Family Medicine

## 2008-08-03 ENCOUNTER — Encounter: Payer: Self-pay | Admitting: Family Medicine

## 2008-08-07 DIAGNOSIS — Z8719 Personal history of other diseases of the digestive system: Secondary | ICD-10-CM

## 2008-08-07 DIAGNOSIS — D509 Iron deficiency anemia, unspecified: Secondary | ICD-10-CM | POA: Insufficient documentation

## 2008-08-07 DIAGNOSIS — Z8679 Personal history of other diseases of the circulatory system: Secondary | ICD-10-CM | POA: Insufficient documentation

## 2008-08-07 HISTORY — DX: Personal history of other diseases of the digestive system: Z87.19

## 2008-08-15 ENCOUNTER — Encounter: Payer: Self-pay | Admitting: Family Medicine

## 2008-08-15 ENCOUNTER — Telehealth: Payer: Self-pay | Admitting: Family Medicine

## 2008-08-20 ENCOUNTER — Telehealth: Payer: Self-pay | Admitting: Family Medicine

## 2008-08-27 ENCOUNTER — Telehealth: Payer: Self-pay | Admitting: Family Medicine

## 2008-08-29 ENCOUNTER — Encounter: Payer: Self-pay | Admitting: Family Medicine

## 2008-09-06 ENCOUNTER — Encounter: Payer: Self-pay | Admitting: Family Medicine

## 2008-09-06 ENCOUNTER — Telehealth: Payer: Self-pay | Admitting: Family Medicine

## 2008-09-07 ENCOUNTER — Encounter: Payer: Self-pay | Admitting: Family Medicine

## 2008-09-12 ENCOUNTER — Encounter: Payer: Self-pay | Admitting: Family Medicine

## 2008-09-18 ENCOUNTER — Encounter: Payer: Self-pay | Admitting: Family Medicine

## 2008-09-19 ENCOUNTER — Encounter: Payer: Self-pay | Admitting: Family Medicine

## 2008-09-26 ENCOUNTER — Ambulatory Visit: Payer: Self-pay | Admitting: Family Medicine

## 2008-09-26 LAB — CONVERTED CEMR LAB
Glucose, Urine, Semiquant: NEGATIVE
Specific Gravity, Urine: 1.02
WBC Urine, dipstick: NEGATIVE

## 2008-10-01 ENCOUNTER — Telehealth: Payer: Self-pay | Admitting: Family Medicine

## 2008-10-01 LAB — CONVERTED CEMR LAB
AST: 18 units/L (ref 0–37)
Albumin: 4.3 g/dL (ref 3.5–5.2)
Alkaline Phosphatase: 80 units/L (ref 39–117)
CO2: 23 meq/L (ref 19–32)
Calcium: 9.4 mg/dL (ref 8.4–10.5)
Creatinine, Ser: 1.02 mg/dL (ref 0.40–1.20)
HDL: 36 mg/dL — ABNORMAL LOW (ref >39)
Indirect Bilirubin: 0.4 mg/dL (ref 0.0–0.9)
Total Bilirubin: 0.5 mg/dL (ref 0.3–1.2)
Triglycerides: 312 mg/dL — ABNORMAL HIGH (ref <150)

## 2008-10-04 ENCOUNTER — Telehealth: Payer: Self-pay | Admitting: Family Medicine

## 2008-10-16 ENCOUNTER — Telehealth: Payer: Self-pay | Admitting: Family Medicine

## 2008-10-18 ENCOUNTER — Telehealth: Payer: Self-pay | Admitting: Family Medicine

## 2008-11-08 ENCOUNTER — Telehealth: Payer: Self-pay | Admitting: Family Medicine

## 2008-11-09 ENCOUNTER — Telehealth: Payer: Self-pay | Admitting: Family Medicine

## 2008-11-09 ENCOUNTER — Encounter: Payer: Self-pay | Admitting: Family Medicine

## 2008-11-14 ENCOUNTER — Telehealth: Payer: Self-pay | Admitting: Family Medicine

## 2008-11-16 ENCOUNTER — Encounter: Payer: Self-pay | Admitting: Family Medicine

## 2008-11-20 ENCOUNTER — Encounter: Payer: Self-pay | Admitting: Family Medicine

## 2008-11-21 ENCOUNTER — Encounter
Admission: RE | Admit: 2008-11-21 | Discharge: 2008-11-21 | Payer: Self-pay | Admitting: Physical Medicine & Rehabilitation

## 2008-12-06 ENCOUNTER — Encounter: Payer: Self-pay | Admitting: Family Medicine

## 2008-12-10 ENCOUNTER — Encounter: Payer: Self-pay | Admitting: Family Medicine

## 2008-12-14 ENCOUNTER — Telehealth: Payer: Self-pay | Admitting: Family Medicine

## 2008-12-17 ENCOUNTER — Encounter: Payer: Self-pay | Admitting: Family Medicine

## 2009-01-03 ENCOUNTER — Ambulatory Visit: Payer: Self-pay | Admitting: Family Medicine

## 2009-01-03 ENCOUNTER — Telehealth: Payer: Self-pay | Admitting: Family Medicine

## 2009-01-16 ENCOUNTER — Telehealth: Payer: Self-pay | Admitting: Family Medicine

## 2009-01-17 ENCOUNTER — Ambulatory Visit: Payer: Self-pay | Admitting: Cardiology

## 2009-01-17 ENCOUNTER — Encounter: Payer: Self-pay | Admitting: Physician Assistant

## 2009-01-18 ENCOUNTER — Telehealth: Payer: Self-pay | Admitting: Family Medicine

## 2009-01-22 ENCOUNTER — Ambulatory Visit: Payer: Self-pay | Admitting: Cardiology

## 2009-01-22 ENCOUNTER — Encounter: Payer: Self-pay | Admitting: Cardiology

## 2009-01-22 ENCOUNTER — Ambulatory Visit (HOSPITAL_COMMUNITY): Admission: RE | Admit: 2009-01-22 | Discharge: 2009-01-22 | Payer: Self-pay | Admitting: Cardiology

## 2009-01-23 ENCOUNTER — Encounter: Payer: Self-pay | Admitting: Family Medicine

## 2009-01-24 ENCOUNTER — Encounter: Payer: Self-pay | Admitting: Family Medicine

## 2009-01-24 LAB — CONVERTED CEMR LAB: Direct LDL: 67 mg/dL

## 2009-01-30 LAB — CONVERTED CEMR LAB
Alkaline Phosphatase: 80 units/L (ref 39–117)
BUN: 15 mg/dL (ref 6–23)
Basophils Relative: 0 % (ref 0–1)
Bilirubin, Direct: <0.1 mg/dL (ref 0.0–0.3)
CO2: 21 meq/L (ref 19–32)
Chloride: 103 meq/L (ref 96–112)
Creatinine, Ser: 0.87 mg/dL (ref 0.40–1.20)
Ferritin: 80 ng/mL (ref 10–291)
Glucose, Bld: 81 mg/dL (ref 70–99)
Hemoglobin: 14.9 g/dL (ref 12.0–15.0)
Lymphocytes Relative: 33 % (ref 12–46)
Lymphs Abs: 2.2 10*3/uL (ref 0.7–4.0)
MCHC: 33 g/dL (ref 30.0–36.0)
Monocytes Absolute: 0.4 10*3/uL (ref 0.1–1.0)
Monocytes Relative: 6 % (ref 3–12)
Neutro Abs: 4 10*3/uL (ref 1.7–7.7)
Potassium: 3.9 meq/L (ref 3.5–5.3)
RBC: 5 M/uL (ref 3.87–5.11)
TIBC: 287 ug/dL (ref 250–470)
Total Bilirubin: 0.3 mg/dL (ref 0.3–1.2)
WBC: 6.7 10*3/uL (ref 4.0–10.5)

## 2009-02-12 ENCOUNTER — Encounter: Payer: Self-pay | Admitting: Family Medicine

## 2009-02-16 ENCOUNTER — Encounter: Payer: Self-pay | Admitting: Family Medicine

## 2009-02-27 ENCOUNTER — Encounter: Payer: Self-pay | Admitting: Family Medicine

## 2009-03-15 ENCOUNTER — Encounter: Payer: Self-pay | Admitting: Family Medicine

## 2009-03-20 ENCOUNTER — Encounter: Payer: Self-pay | Admitting: Family Medicine

## 2009-03-26 ENCOUNTER — Ambulatory Visit: Payer: Self-pay | Admitting: Family Medicine

## 2009-03-26 LAB — CONVERTED CEMR LAB
Blood in Urine, dipstick: NEGATIVE
Glucose, Urine, Semiquant: NEGATIVE
Nitrite: NEGATIVE
pH: 6

## 2009-03-27 ENCOUNTER — Encounter: Payer: Self-pay | Admitting: Family Medicine

## 2009-03-27 LAB — CONVERTED CEMR LAB
BUN: 19 mg/dL (ref 6–23)
Chloride: 102 meq/L (ref 96–112)
Creatinine, Ser: 0.94 mg/dL (ref 0.40–1.20)
Potassium: 4.3 meq/L (ref 3.5–5.3)
TSH: 1.021 microintl units/mL (ref 0.350–4.500)

## 2009-04-03 ENCOUNTER — Telehealth: Payer: Self-pay | Admitting: Family Medicine

## 2009-04-05 ENCOUNTER — Emergency Department (HOSPITAL_COMMUNITY): Admission: EM | Admit: 2009-04-05 | Discharge: 2009-04-06 | Payer: Self-pay | Admitting: Emergency Medicine

## 2009-04-08 ENCOUNTER — Encounter: Payer: Self-pay | Admitting: Family Medicine

## 2009-04-08 ENCOUNTER — Telehealth: Payer: Self-pay | Admitting: Family Medicine

## 2009-04-16 ENCOUNTER — Encounter: Payer: Self-pay | Admitting: Family Medicine

## 2009-05-01 ENCOUNTER — Ambulatory Visit: Payer: Self-pay | Admitting: Family Medicine

## 2009-05-01 LAB — CONVERTED CEMR LAB
Glucose, Urine, Semiquant: NEGATIVE
Protein, U semiquant: 30
Specific Gravity, Urine: >=1.03
WBC Urine, dipstick: NEGATIVE
pH: 5.5

## 2009-05-03 ENCOUNTER — Telehealth: Payer: Self-pay | Admitting: Family Medicine

## 2009-05-10 ENCOUNTER — Telehealth: Payer: Self-pay | Admitting: Family Medicine

## 2009-05-15 ENCOUNTER — Telehealth: Payer: Self-pay | Admitting: Family Medicine

## 2009-05-16 ENCOUNTER — Telehealth: Payer: Self-pay | Admitting: Family Medicine

## 2009-05-20 ENCOUNTER — Encounter: Payer: Self-pay | Admitting: Family Medicine

## 2009-05-21 ENCOUNTER — Encounter: Payer: Self-pay | Admitting: Family Medicine

## 2009-05-21 LAB — CONVERTED CEMR LAB
ALT: 26 units/L (ref 0–35)
AST: 31 units/L (ref 0–37)
Albumin: 3.8 g/dL (ref 3.5–5.2)
Alkaline Phosphatase: 99 units/L (ref 39–117)
CO2: 21 meq/L (ref 19–32)
Calcium: 8.8 mg/dL (ref 8.4–10.5)
Cholesterol: 103 mg/dL (ref 0–200)
Creatinine, Ser: 0.86 mg/dL (ref 0.40–1.20)
HDL: 33 mg/dL — ABNORMAL LOW (ref >39)
Sodium: 141 meq/L (ref 135–145)
TSH: 4.543 microintl units/mL — ABNORMAL HIGH (ref 0.350–4.500)
Total CHOL/HDL Ratio: 3.1
Total Protein: 6.4 g/dL (ref 6.0–8.3)
Triglycerides: 82 mg/dL (ref <150)

## 2009-06-03 ENCOUNTER — Ambulatory Visit: Payer: Self-pay | Admitting: Family Medicine

## 2009-06-03 LAB — CONVERTED CEMR LAB
Nitrite: NEGATIVE
Urobilinogen, UA: 1
WBC Urine, dipstick: NEGATIVE
pH: 5

## 2009-06-10 ENCOUNTER — Encounter: Payer: Self-pay | Admitting: Family Medicine

## 2009-06-12 ENCOUNTER — Telehealth: Payer: Self-pay | Admitting: Family Medicine

## 2009-06-14 LAB — CONVERTED CEMR LAB: Vit D, 25-Hydroxy: 18 ng/mL — ABNORMAL LOW (ref 30–89)

## 2009-06-27 ENCOUNTER — Ambulatory Visit: Payer: Self-pay | Admitting: Family Medicine

## 2009-08-02 ENCOUNTER — Encounter: Payer: Self-pay | Admitting: Family Medicine

## 2009-08-06 ENCOUNTER — Telehealth: Payer: Self-pay | Admitting: Family Medicine

## 2009-08-06 ENCOUNTER — Ambulatory Visit: Payer: Self-pay | Admitting: Family Medicine

## 2009-08-08 ENCOUNTER — Ambulatory Visit: Payer: Self-pay | Admitting: Family Medicine

## 2009-08-08 DIAGNOSIS — F329 Major depressive disorder, single episode, unspecified: Secondary | ICD-10-CM | POA: Insufficient documentation

## 2009-08-08 DIAGNOSIS — F3289 Other specified depressive episodes: Secondary | ICD-10-CM | POA: Insufficient documentation

## 2009-08-09 ENCOUNTER — Encounter: Payer: Self-pay | Admitting: Family Medicine

## 2009-08-11 ENCOUNTER — Emergency Department (HOSPITAL_COMMUNITY): Admission: EM | Admit: 2009-08-11 | Discharge: 2009-08-11 | Payer: Self-pay | Admitting: Emergency Medicine

## 2009-08-11 ENCOUNTER — Inpatient Hospital Stay (HOSPITAL_COMMUNITY): Admission: EM | Admit: 2009-08-11 | Discharge: 2009-08-14 | Payer: Self-pay | Admitting: Emergency Medicine

## 2009-08-16 ENCOUNTER — Ambulatory Visit: Payer: Self-pay | Admitting: Gastroenterology

## 2009-08-16 ENCOUNTER — Inpatient Hospital Stay (HOSPITAL_COMMUNITY): Admission: EM | Admit: 2009-08-16 | Discharge: 2009-08-19 | Payer: Self-pay | Admitting: Emergency Medicine

## 2009-08-27 ENCOUNTER — Telehealth: Payer: Self-pay | Admitting: Internal Medicine

## 2009-09-03 ENCOUNTER — Ambulatory Visit: Payer: Self-pay | Admitting: Family Medicine

## 2009-09-03 ENCOUNTER — Ambulatory Visit (HOSPITAL_COMMUNITY): Admission: RE | Admit: 2009-09-03 | Discharge: 2009-09-03 | Payer: Self-pay | Admitting: Family Medicine

## 2009-09-04 ENCOUNTER — Telehealth: Payer: Self-pay | Admitting: Gastroenterology

## 2009-09-11 ENCOUNTER — Ambulatory Visit: Payer: Self-pay | Admitting: Internal Medicine

## 2009-09-11 ENCOUNTER — Encounter: Admission: RE | Admit: 2009-09-11 | Discharge: 2009-09-11 | Payer: Self-pay | Admitting: Family Medicine

## 2009-09-16 ENCOUNTER — Ambulatory Visit (HOSPITAL_COMMUNITY): Payer: Self-pay | Admitting: Psychology

## 2009-09-17 ENCOUNTER — Telehealth: Payer: Self-pay | Admitting: Family Medicine

## 2009-09-18 ENCOUNTER — Encounter: Payer: Self-pay | Admitting: Family Medicine

## 2009-09-19 ENCOUNTER — Encounter: Payer: Self-pay | Admitting: Gastroenterology

## 2009-10-02 ENCOUNTER — Ambulatory Visit (HOSPITAL_COMMUNITY): Payer: Self-pay | Admitting: Psychology

## 2009-10-02 ENCOUNTER — Ambulatory Visit: Payer: Self-pay | Admitting: Family Medicine

## 2009-10-03 ENCOUNTER — Encounter: Payer: Self-pay | Admitting: Family Medicine

## 2009-10-15 ENCOUNTER — Telehealth: Payer: Self-pay | Admitting: Family Medicine

## 2009-10-16 ENCOUNTER — Ambulatory Visit (HOSPITAL_COMMUNITY): Payer: Self-pay | Admitting: Psychology

## 2009-10-17 ENCOUNTER — Telehealth (INDEPENDENT_AMBULATORY_CARE_PROVIDER_SITE_OTHER): Payer: Self-pay | Admitting: *Deleted

## 2009-10-17 LAB — CONVERTED CEMR LAB
ALT: 12 units/L (ref 0–35)
AST: 14 units/L (ref 0–37)
Bilirubin, Direct: 0.1 mg/dL (ref 0.0–0.3)
Cholesterol: 173 mg/dL (ref 0–200)
Indirect Bilirubin: 0.2 mg/dL (ref 0.0–0.9)
Total Bilirubin: 0.3 mg/dL (ref 0.3–1.2)
VLDL: 35 mg/dL (ref 0–40)

## 2009-10-29 ENCOUNTER — Ambulatory Visit: Payer: Self-pay | Admitting: Family Medicine

## 2009-11-04 ENCOUNTER — Encounter: Payer: Self-pay | Admitting: Family Medicine

## 2009-11-08 ENCOUNTER — Encounter: Payer: Self-pay | Admitting: Family Medicine

## 2009-11-18 ENCOUNTER — Encounter: Payer: Self-pay | Admitting: Family Medicine

## 2009-12-09 ENCOUNTER — Inpatient Hospital Stay (HOSPITAL_COMMUNITY): Admission: EM | Admit: 2009-12-09 | Discharge: 2009-12-10 | Payer: Self-pay | Admitting: Emergency Medicine

## 2009-12-09 ENCOUNTER — Encounter: Payer: Self-pay | Admitting: Family Medicine

## 2009-12-12 ENCOUNTER — Telehealth: Payer: Self-pay | Admitting: Family Medicine

## 2009-12-26 ENCOUNTER — Ambulatory Visit: Payer: Self-pay | Admitting: Family Medicine

## 2009-12-26 ENCOUNTER — Encounter (INDEPENDENT_AMBULATORY_CARE_PROVIDER_SITE_OTHER): Payer: Self-pay | Admitting: *Deleted

## 2009-12-26 DIAGNOSIS — N83209 Unspecified ovarian cyst, unspecified side: Secondary | ICD-10-CM

## 2009-12-26 HISTORY — DX: Unspecified ovarian cyst, unspecified side: N83.209

## 2009-12-26 LAB — CONVERTED CEMR LAB
ALT: 11 units/L
AST: 16 units/L
Bilirubin, Direct: 0.1 mg/dL
CO2: 21 meq/L
Cholesterol: 214 mg/dL
HDL: 54 mg/dL
LDL Cholesterol: 117 mg/dL
Sodium: 140 meq/L
Total Protein: 6.8 g/dL

## 2010-01-17 ENCOUNTER — Encounter: Payer: Self-pay | Admitting: Family Medicine

## 2010-01-22 ENCOUNTER — Encounter (INDEPENDENT_AMBULATORY_CARE_PROVIDER_SITE_OTHER): Payer: Self-pay | Admitting: *Deleted

## 2010-02-03 ENCOUNTER — Encounter: Payer: Self-pay | Admitting: Adult Health

## 2010-02-03 ENCOUNTER — Ambulatory Visit: Payer: Self-pay | Admitting: Cardiology

## 2010-02-03 DIAGNOSIS — R000 Tachycardia, unspecified: Secondary | ICD-10-CM

## 2010-02-03 HISTORY — DX: Tachycardia, unspecified: R00.0

## 2010-02-04 ENCOUNTER — Encounter: Payer: Self-pay | Admitting: Adult Health

## 2010-02-06 ENCOUNTER — Ambulatory Visit: Payer: Self-pay | Admitting: Cardiology

## 2010-02-06 ENCOUNTER — Encounter: Payer: Self-pay | Admitting: Cardiology

## 2010-02-06 ENCOUNTER — Ambulatory Visit (HOSPITAL_COMMUNITY): Admission: RE | Admit: 2010-02-06 | Discharge: 2010-02-06 | Payer: Self-pay | Admitting: Cardiology

## 2010-02-14 ENCOUNTER — Ambulatory Visit: Payer: Self-pay | Admitting: Cardiology

## 2010-02-18 ENCOUNTER — Ambulatory Visit: Payer: Self-pay | Admitting: Family Medicine

## 2010-03-17 ENCOUNTER — Encounter: Payer: Self-pay | Admitting: Cardiology

## 2010-03-19 ENCOUNTER — Telehealth: Payer: Self-pay | Admitting: Family Medicine

## 2010-03-19 ENCOUNTER — Ambulatory Visit: Payer: Self-pay | Admitting: Family Medicine

## 2010-03-19 LAB — CONVERTED CEMR LAB
Glucose, Urine, Semiquant: NEGATIVE
Nitrite: NEGATIVE
Urobilinogen, UA: 0.2
WBC Urine, dipstick: NEGATIVE
pH: 6

## 2010-03-27 ENCOUNTER — Ambulatory Visit (HOSPITAL_COMMUNITY): Admission: RE | Admit: 2010-03-27 | Discharge: 2010-03-27 | Payer: Self-pay | Admitting: Family Medicine

## 2010-04-01 ENCOUNTER — Telehealth: Payer: Self-pay | Admitting: Family Medicine

## 2010-04-01 ENCOUNTER — Ambulatory Visit: Payer: Self-pay | Admitting: Family Medicine

## 2010-04-02 ENCOUNTER — Telehealth: Payer: Self-pay | Admitting: Family Medicine

## 2010-04-02 ENCOUNTER — Ambulatory Visit (HOSPITAL_COMMUNITY): Admission: RE | Admit: 2010-04-02 | Discharge: 2010-04-02 | Payer: Self-pay | Admitting: Family Medicine

## 2010-04-03 ENCOUNTER — Telehealth: Payer: Self-pay | Admitting: Family Medicine

## 2010-04-03 ENCOUNTER — Encounter: Payer: Self-pay | Admitting: Family Medicine

## 2010-04-04 ENCOUNTER — Encounter: Payer: Self-pay | Admitting: Family Medicine

## 2010-04-07 ENCOUNTER — Encounter: Payer: Self-pay | Admitting: Family Medicine

## 2010-04-08 ENCOUNTER — Telehealth: Payer: Self-pay | Admitting: Family Medicine

## 2010-04-09 ENCOUNTER — Telehealth: Payer: Self-pay | Admitting: Cardiology

## 2010-04-15 ENCOUNTER — Telehealth: Payer: Self-pay | Admitting: Family Medicine

## 2010-04-15 LAB — CONVERTED CEMR LAB
AST: 15 units/L (ref 0–37)
Albumin: 4.6 g/dL (ref 3.5–5.2)
Alkaline Phosphatase: 86 units/L (ref 39–117)
BUN: 19 mg/dL (ref 6–23)
Bilirubin, Direct: 0.1 mg/dL (ref 0.0–0.3)
CO2: 23 meq/L (ref 19–32)
Calcium: 9.4 mg/dL (ref 8.4–10.5)
Chloride: 101 meq/L (ref 96–112)
Creatinine, Ser: 0.83 mg/dL (ref 0.40–1.20)
Glucose, Bld: 106 mg/dL — ABNORMAL HIGH (ref 70–99)
Hgb A1c MFr Bld: 6 % (ref 4.6–6.1)
Indirect Bilirubin: 0.4 mg/dL (ref 0.0–0.9)
LDL Cholesterol: 138 mg/dL — ABNORMAL HIGH (ref 0–99)
Total Bilirubin: 0.5 mg/dL (ref 0.3–1.2)
Vit D, 25-Hydroxy: 41 ng/mL (ref 30–89)

## 2010-04-23 ENCOUNTER — Telehealth: Payer: Self-pay | Admitting: Family Medicine

## 2010-05-05 ENCOUNTER — Telehealth: Payer: Self-pay | Admitting: Family Medicine

## 2010-05-06 ENCOUNTER — Encounter: Payer: Self-pay | Admitting: Family Medicine

## 2010-05-16 ENCOUNTER — Encounter: Payer: Self-pay | Admitting: Family Medicine

## 2010-06-05 ENCOUNTER — Telehealth: Payer: Self-pay | Admitting: Family Medicine

## 2010-06-09 ENCOUNTER — Ambulatory Visit: Payer: Self-pay | Admitting: Family Medicine

## 2010-06-09 DIAGNOSIS — G5600 Carpal tunnel syndrome, unspecified upper limb: Secondary | ICD-10-CM | POA: Insufficient documentation

## 2010-06-09 DIAGNOSIS — N3000 Acute cystitis without hematuria: Secondary | ICD-10-CM | POA: Insufficient documentation

## 2010-06-09 HISTORY — DX: Acute cystitis without hematuria: N30.00

## 2010-06-09 LAB — CONVERTED CEMR LAB
Blood in Urine, dipstick: NEGATIVE
Glucose, Urine, Semiquant: NEGATIVE
Ketones, urine, test strip: NEGATIVE
Nitrite: NEGATIVE
Specific Gravity, Urine: 1.025
pH: 5

## 2010-06-11 ENCOUNTER — Encounter: Payer: Self-pay | Admitting: Family Medicine

## 2010-06-11 LAB — CONVERTED CEMR LAB
AST: 13 units/L (ref 0–37)
Alkaline Phosphatase: 77 units/L (ref 39–117)
HDL: 48 mg/dL (ref >39)
Indirect Bilirubin: 0.3 mg/dL (ref 0.0–0.9)
LDL Cholesterol: 81 mg/dL (ref 0–99)
Total Bilirubin: 0.4 mg/dL (ref 0.3–1.2)
Triglycerides: 216 mg/dL — ABNORMAL HIGH (ref <150)

## 2010-06-19 ENCOUNTER — Ambulatory Visit: Payer: Self-pay | Admitting: Otolaryngology

## 2010-06-23 ENCOUNTER — Telehealth: Payer: Self-pay | Admitting: Family Medicine

## 2010-06-23 ENCOUNTER — Encounter: Payer: Self-pay | Admitting: Family Medicine

## 2010-06-26 ENCOUNTER — Encounter: Payer: Self-pay | Admitting: Family Medicine

## 2010-07-03 ENCOUNTER — Telehealth: Payer: Self-pay | Admitting: Family Medicine

## 2010-07-04 ENCOUNTER — Telehealth: Payer: Self-pay | Admitting: Family Medicine

## 2010-07-07 ENCOUNTER — Telehealth: Payer: Self-pay | Admitting: Family Medicine

## 2010-07-17 ENCOUNTER — Encounter: Payer: Self-pay | Admitting: Family Medicine

## 2010-08-05 ENCOUNTER — Encounter: Payer: Self-pay | Admitting: Family Medicine

## 2010-08-08 ENCOUNTER — Encounter: Payer: Self-pay | Admitting: Family Medicine

## 2010-08-13 ENCOUNTER — Telehealth: Payer: Self-pay | Admitting: Family Medicine

## 2010-08-18 ENCOUNTER — Telehealth: Payer: Self-pay | Admitting: Family Medicine

## 2010-08-22 ENCOUNTER — Encounter: Payer: Self-pay | Admitting: Family Medicine

## 2010-08-28 ENCOUNTER — Encounter: Payer: Self-pay | Admitting: Family Medicine

## 2010-09-03 ENCOUNTER — Telehealth: Payer: Self-pay | Admitting: Family Medicine

## 2010-09-09 ENCOUNTER — Ambulatory Visit: Payer: Self-pay | Admitting: Family Medicine

## 2010-09-11 LAB — CONVERTED CEMR LAB
Alkaline Phosphatase: 80 units/L (ref 39–117)
BUN: 18 mg/dL (ref 6–23)
Bilirubin, Direct: 0.1 mg/dL (ref 0.0–0.3)
Chloride: 104 meq/L (ref 96–112)
Creatinine, Ser: 0.87 mg/dL (ref 0.40–1.20)
Free T4: 1.37 ng/dL (ref 0.80–1.80)
Glucose, Bld: 97 mg/dL (ref 70–99)
Hgb A1c MFr Bld: 5.9 % — ABNORMAL HIGH (ref <5.7)
Indirect Bilirubin: 0.4 mg/dL (ref 0.0–0.9)
LDL Cholesterol: 76 mg/dL (ref 0–99)
Potassium: 3.8 meq/L (ref 3.5–5.3)
T3, Free: 2.8 pg/mL (ref 2.3–4.2)
Total Bilirubin: 0.5 mg/dL (ref 0.3–1.2)
VLDL: 53 mg/dL — ABNORMAL HIGH (ref 0–40)

## 2010-09-15 ENCOUNTER — Ambulatory Visit (HOSPITAL_COMMUNITY): Admission: RE | Admit: 2010-09-15 | Discharge: 2010-09-15 | Payer: Self-pay | Admitting: Family Medicine

## 2010-09-17 ENCOUNTER — Telehealth: Payer: Self-pay | Admitting: Family Medicine

## 2010-09-22 ENCOUNTER — Ambulatory Visit: Payer: Self-pay | Admitting: Endocrinology

## 2010-09-22 LAB — CONVERTED CEMR LAB: LH: 20.82 milliintl units/mL

## 2010-09-24 ENCOUNTER — Encounter: Payer: Self-pay | Admitting: Family Medicine

## 2010-09-25 ENCOUNTER — Telehealth: Payer: Self-pay | Admitting: Family Medicine

## 2010-09-30 ENCOUNTER — Telehealth: Payer: Self-pay | Admitting: Family Medicine

## 2010-10-01 ENCOUNTER — Encounter: Payer: Self-pay | Admitting: Family Medicine

## 2010-10-03 ENCOUNTER — Encounter: Payer: Self-pay | Admitting: Family Medicine

## 2010-10-08 ENCOUNTER — Ambulatory Visit: Payer: Self-pay | Admitting: Family Medicine

## 2010-10-08 ENCOUNTER — Telehealth: Payer: Self-pay | Admitting: Family Medicine

## 2010-11-06 ENCOUNTER — Encounter: Payer: Self-pay | Admitting: Family Medicine

## 2010-11-07 ENCOUNTER — Telehealth: Payer: Self-pay | Admitting: Family Medicine

## 2010-11-24 ENCOUNTER — Telehealth: Payer: Self-pay | Admitting: Family Medicine

## 2010-11-24 ENCOUNTER — Ambulatory Visit: Payer: Self-pay | Admitting: Family Medicine

## 2010-11-24 ENCOUNTER — Telehealth (INDEPENDENT_AMBULATORY_CARE_PROVIDER_SITE_OTHER): Payer: Self-pay | Admitting: *Deleted

## 2010-12-09 ENCOUNTER — Ambulatory Visit: Payer: Self-pay | Admitting: Family Medicine

## 2011-01-17 ENCOUNTER — Encounter: Payer: Self-pay | Admitting: Family Medicine

## 2011-01-18 ENCOUNTER — Encounter: Payer: Self-pay | Admitting: Family Medicine

## 2011-01-18 ENCOUNTER — Encounter (HOSPITAL_COMMUNITY): Payer: Self-pay | Admitting: Internal Medicine

## 2011-01-19 ENCOUNTER — Encounter: Payer: Self-pay | Admitting: Family Medicine

## 2011-01-20 ENCOUNTER — Telehealth: Payer: Self-pay | Admitting: Family Medicine

## 2011-01-27 NOTE — Progress Notes (Signed)
Summary: call/ surgery  Phone Note Call from Patient   Summary of Call: is having sugery the 30th of this month and needs for someone to call her at [phone] Initial call taken by: Lind Guest,  September 25, 2010 12:57 PM  Follow-up for Phone Call        Having reconstruction surgery tomorrow and they told her she may need a few pain pills after the surgery and she wants you to know she may fill the rx but she will only take them if she is in extreme pain because she wants to be done with them for good.  Follow-up by: Everitt Amber LPN,  September 25, 2010 1:50 PM

## 2011-01-27 NOTE — Letter (Signed)
Summary: no show / dr. Mariel Sleet  no show / dr. Mariel Sleet   Imported By: Lind Guest 08/05/2010 11:30:40  _____________________________________________________________________  External Attachment:    Type:   Image     Comment:   External Document

## 2011-01-27 NOTE — Progress Notes (Signed)
  Phone Note Other Incoming   Caller: dr sdimpson  Summary of Call: pls notify pt to stop simvastatin due to interaction with cardizem fax stop order to her pharmacy, [facility] who called, and she needs ov in Sept if still on apetite supperessant if she has none in August Initial call taken by: Syliva Overman MD,  August 18, 2010 5:56 PM  Follow-up for Phone Call        called patient, left message Follow-up by: Adella Hare LPN,  August 19, 2010 10:21 AM     Appended Document:  Patient said that she hasn't taken Cardizem since 1996 or so. She does take Verapamil 120mg  though. Do you still want her off the Simvastatin or is she ok to stay on it?  Appended Document:  no simvastatin, change to pravstatin 40mg  Take 1 tab by mouth at bedtime #30 refill3, pls do not refill any apetite suppressant for her she needs ov for that, remove simva from med list, notify the pharmacy [facility] pls  Appended Document:  rx sent, called patient, left message

## 2011-01-27 NOTE — Letter (Signed)
Summary: chart 2 phone notes  chart 2 phone notes   Imported By: Curtis Sites 08/07/2010 11:56:45  _____________________________________________________________________  External Attachment:    Type:   Image     Comment:   External Document

## 2011-01-27 NOTE — Progress Notes (Signed)
Summary: refill  Phone Note Call from Patient   Summary of Call: pt needs to get a refill on nexium through [facility] 40mg .  [phone] Initial call taken by: Rudene Anda,  September 03, 2010 2:31 PM  Follow-up for Phone Call        Rx Called In Follow-up by: Adella Hare LPN,  September 03, 2010 2:55 PM    Prescriptions: NEXIUM 40 MG CPDR (ESOMEPRAZOLE MAGNESIUM) Take 1 tablet by mouth once daily  #90 x 0   Entered by:   Adella Hare LPN   Authorized by:   Syliva Overman MD   Signed by:   Adella Hare LPN on 40/34/7425   Method used:   Faxed to ...       [facility] (mail-order)             , [place]         Ph: [phone]       Fax: [phone]   RxID:   [id number]

## 2011-01-27 NOTE — Assessment & Plan Note (Signed)
Summary: OV   Vital Signs:  Patient profile:   50 year old female Menstrual status:  hysterectomy Height:      63 inches Weight:      241 pounds BMI:     42.85 O2 Sat:      98 % Pulse rate:   103 / minute Resp:     16 per minute BP sitting:   110 / 74  (left arm) Cuff size:   large  Vitals Entered By: Everitt Amber LPN (October 08, 2010 3:12 PM) CC: has some tears on her vaginal area and also some irritation   Referring Provider:  Syliva Overman Primary Provider:  Syliva Overman MD  CC:  has some tears on her vaginal area and also some irritation.  History of Present Illness: Pt had reconstructive surgery approx 2 weeks ago (related to her Breast CA).  She was on IV antibiotics and has a hx of vaginal yeast infections from antibiotics.  A few days ago she noticed 2 areas that look like small tears near her clitoris.  Then the next day noticed vaginal itching and thick white dischg.  Had 3 diflucan pills so took one of those daily and also has been using a vaginal Monistat cream.  She has been applying over the counter antibiotic ointment to the tears.  No prev hx of same. No hx of HSV.  Current Medications (verified): 1)  Miralax  Powd (Polyethylene Glycol 3350) .Marland KitchenMarland KitchenMarland Kitchen 17 Grams Two Times A Day 2)  Docusate Sodium 100 Mg Tabs (Docusate Sodium) .... Take 1 Tablet By Mouth Two Times A Day 3)  Nexium 40 Mg Cpdr (Esomeprazole Magnesium) .... Take 1 Tablet By Mouth Once Daily 4)  Biotin 5000 5 Mg Caps (Biotin) .... Take 1 Tablet By Mouth Two Times A Day 5)  Alprazolam 1 Mg Tabs (Alprazolam) .... Take 1 Tablet By Mouth Four Times A Day As Needed 6)  Spironolactone 25 Mg Tabs (Spironolactone) .... One Tab By Mouth Bid 7)  Cyclobenzaprine Hcl 10 Mg Tabs (Cyclobenzaprine Hcl) .... One Tab By Mouth Tid 8)  Nitrostat 0.4 Mg Subl (Nitroglycerin) .... Uad 9)  Verapamil Hcl 120 Mg Tabs (Verapamil Hcl) .... One Tab Every Other Day 10)  Pravachol 40 Mg Tabs (Pravastatin Sodium) .... One Tab By  Mouth At Bedtime Discontinue Simvastatin 11)  Synthroid 100 Mcg Tabs (Levothyroxine Sodium) .... Take 1 Tablet By Mouth Once A Day 12)  Vitamin D3 2000 Unit Caps (Cholecalciferol) .Marland Kitchen.. 1 By Mouth Once Daily 13)  Vitamin E 1000 Unit Caps (Vitamin E) .Marland Kitchen.. 1 By Mouth Once Daily 14)  Vitamin C 1000 Mg Tabs (Ascorbic Acid) .Marland Kitchen.. 1 By Mouth Once Daily 15)  Vitamin B-12 1000 Mcg Tabs (Cyanocobalamin) .Marland Kitchen.. 1 By Mouth Once Daily 16)  Phentermine Hcl 37.5 Mg Caps (Phentermine Hcl) .... Take 1 Tablet By Mouth Once A Day  Allergies (verified): 1)  ! Penicillin 2)  ! Keflex 3)  ! Macrodantin 4)  ! Codeine 5)  ! Sulfa 6)  ! Cleocin 7)  ! Tamiflu (Oseltamivir Phosphate) 8)  ! Dilaudid (Hydromorphone Hcl) 9)  ! Biaxin  Past History:  Past medical history reviewed for relevance to current acute and chronic problems.  Past Medical History: Reviewed history from 06/14/2009 and no changes required. CHEST TIGHTNESS-PRESSURE-OTHER ([id number]) DYSPNEA (ICD-786.05) HYPERLIPIDEMIA (ICD-272.4) OBESITY (ICD-278.00) FATIGUE, CHRONIC (ICD-780.79) NAUSEA WITH VOMITING (ICD-787.01) CYSTITIS (ICD-595.9) ANEMIA, IRON DEFICIENCY, CHRONIC (ICD-280.9) FIBROMYALGIA (ICD-729.1) ANXIETY, CHRONIC (ICD-300.00) HYPOTHYROIDISM (ICD-244.9) ADENOCARCINOMA, BREAST, LEFT (ICD-174.9) ABDOMINAL PAIN, GENERALIZED (ICD-789.07)  KNEE PAIN, BILATERAL (ICD-719.46) NECK PAIN, CHRONIC (ICD-723.1) BACK PAIN, CHRONIC (ICD-724.5) SMALL BOWEL OBSTRUCTION, HX OF (ICD-V12.79) SUPRAVENTRICULAR TACHYCARDIA, HX OF (ICD-V12.59) ABDOMINAL PAIN, HX OF (ICD-V15.89) Hx of VAGINITIS (ICD-616.10)  Review of Systems General:  Denies chills and fever. CV:  Denies chest pain or discomfort and palpitations. Resp:  Denies cough and shortness of breath. GU:  Complains of discharge; denies abnormal vaginal bleeding, dysuria, and urinary frequency.  Physical Exam  General:  Well-developed,well-nourished,in no acute distress; alert,appropriate  and cooperative throughout examination Head:  Normocephalic and atraumatic without obvious abnormalities. No apparent alopecia or balding. Genitalia:  2 small fissures noted superior to clitoral hood with minimal surrounding erythema.  no other external lesions, or erythema.  Vag contains monistat cream, no erythema of friality of vag walls noted.  Bimanual exam neg, nontender.   Impression & Recommendations:  Problem # 1:  VULVOVAGINITIS (ICD-616.10) Assessment Comment Only Discontinue antibiotic ointment. Advised pt to use vaseline or over the counter diaper cream to the area.  Continue Monistat 7 cream until complete course of therapy.  The following medications were removed from the medication list:    Ciprofloxacin Hcl 500 Mg Tabs (Ciprofloxacin hcl) .Marland Kitchen... Take 1 tablet by mouth two times a day  Orders: HSV 2- [facility] ([id number])  Complete Medication List: 1)  Miralax Powd (Polyethylene glycol 3350) .Marland KitchenMarland KitchenMarland Kitchen 17 grams two times a day 2)  Docusate Sodium 100 Mg Tabs (Docusate sodium) .... Take 1 tablet by mouth two times a day 3)  Nexium 40 Mg Cpdr (Esomeprazole magnesium) .... Take 1 tablet by mouth once daily 4)  Biotin 5000 5 Mg Caps (Biotin) .... Take 1 tablet by mouth two times a day 5)  Alprazolam 1 Mg Tabs (Alprazolam) .... Take 1 tablet by mouth four times a day as needed 6)  Spironolactone 25 Mg Tabs (Spironolactone) .... One tab by mouth bid 7)  Cyclobenzaprine Hcl 10 Mg Tabs (Cyclobenzaprine hcl) .... One tab by mouth tid 8)  Nitrostat 0.4 Mg Subl (Nitroglycerin) .... Uad 9)  Verapamil Hcl 120 Mg Tabs (Verapamil hcl) .... One tab every other day 10)  Pravachol 40 Mg Tabs (Pravastatin sodium) .... One tab by mouth at bedtime discontinue simvastatin 11)  Synthroid 100 Mcg Tabs (Levothyroxine sodium) .... Take 1 tablet by mouth once a day 12)  Vitamin D3 2000 Unit Caps (Cholecalciferol) .Marland Kitchen.. 1 by mouth once daily 13)  Vitamin E 1000 Unit Caps (Vitamin e) .Marland Kitchen.. 1 by mouth once  daily 14)  Vitamin C 1000 Mg Tabs (Ascorbic acid) .Marland Kitchen.. 1 by mouth once daily 15)  Vitamin B-12 1000 Mcg Tabs (Cyanocobalamin) .Marland Kitchen.. 1 by mouth once daily 16)  Phentermine Hcl 37.5 Mg Caps (Phentermine hcl) .... Take 1 tablet by mouth once a day  Patient Instructions: 1)  Please schedule a follow-up appointment as needed. 2)  Discontinue using antibiotic ointment and continue using the Monistat cream. 3)  You may apply vaseline or a diaper ointment, such as Desitin or A&D to the irritated area.

## 2011-01-27 NOTE — Letter (Signed)
Summary: xray  xray   Imported By: Curtis Sites 08/07/2010 11:58:36  _____________________________________________________________________  External Attachment:    Type:   Image     Comment:   External Document

## 2011-01-27 NOTE — Letter (Signed)
Summary: chart 1 phone notes  chart 1 phone notes   Imported By: Curtis Sites 08/07/2010 11:04:22  _____________________________________________________________________  External Attachment:    Type:   Image     Comment:   External Document

## 2011-01-27 NOTE — Progress Notes (Signed)
Summary: referral  Phone Note Call from Patient   Summary of Call: dr. Lenis Noon office called and stated that they wouldn't be able to see her due to this is not something he deals with. They also told me to call [facility] and tell them that she wouldn't be seen by Dr. Lenis Noon because he didn't deal with this. I spoke with pt and she is very confused and I told her I would let you know. Also she said that Dr. Shelle Iron was very proud of her for getting her injections. And stated she was right about getting them before she got on plane. stated he was upset with Dr. Dietrich Pates over this to.  cell [phone] Initial call taken by: Rudene Anda,  April 23, 2010 3:29 PM  Follow-up for Phone Call        noted, i think she onklyu wants dr Lenis Noon, ifshe is willing to see or wants to see another oncologist at [facility] about this see if anyone will see her sooner pls  Follow-up by: Syliva Overman MD,  April 23, 2010 4:44 PM  Additional Follow-up for Phone Call Additional follow up Details #1::        she is wanting to see dr. Lenis Noon. But he won't see her. Says that he doesn't need to for her back. All she wants to know does that mean he thinks everything is okay.  Additional Follow-up by: Rudene Anda,  Apr 28, 2010 3:25 PM    Additional Follow-up for Phone Call Additional follow up Details #2::    pls let her know that the doc got the Mri report and themsg is that he will not need to see her,  Follow-up by: Syliva Overman MD,  Apr 28, 2010 4:15 PM  Additional Follow-up for Phone Call Additional follow up Details #3:: Details for Additional Follow-up Action Taken: called pt and she was out of town. Told her husband to have her call office when she comes back in town.  Additional Follow-up by: Rudene Anda,  Apr 29, 2010 10:17 AM

## 2011-01-27 NOTE — Letter (Signed)
Summary: Open MRI with contrast  Open MRI with contrast   Imported By: Rudene Anda 04/03/2010 11:01:04  _____________________________________________________________________  External Attachment:    Type:   Image     Comment:   External Document

## 2011-01-27 NOTE — Miscellaneous (Signed)
Summary: MED  Clinical Lists Changes  Medications: Removed medication of * IONAMIN 30MG  Take 1 tablet by mouth once a day Removed medication of * IONAMINE Take 1 tablet by mouth once a day Added new medication of PHENTERMINE HCL 37.5 MG CAPS (PHENTERMINE HCL) Take 1 tablet by mouth once a day - Signed Rx of PHENTERMINE HCL 37.5 MG CAPS (PHENTERMINE HCL) Take 1 tablet by mouth once a day;  #30 x 1;  Signed;  Entered by: Everitt Amber LPN;  Authorized by: Syliva Overman MD;  Method used: Printed then faxed to [facility] [address], [place], [place], [place]  [address], Ph: [phone], Fax: [phone]    Prescriptions: PHENTERMINE HCL 37.5 MG CAPS (PHENTERMINE HCL) Take 1 tablet by mouth once a day  #30 x 1   Entered by:   Everitt Amber LPN   Authorized by:   Syliva Overman MD   Signed by:   Everitt Amber LPN on 40/34/7425   Method used:   Printed then faxed to ...       [facility] (retail)       [address]       [place], [place]  [address]       Ph: [phone]       Fax: [phone]   RxID:   [id number]   Appended Document: MED Patient unable to find pharmacy that had the Iomine so sent in generic

## 2011-01-27 NOTE — Miscellaneous (Signed)
Summary: med  Clinical Lists Changes  Medications: Added new medication of * IONAMIN 30MG  Take 1 tablet by mouth once a day - Signed Rx of IONAMIN 30MG  Take 1 tablet by mouth once a day;  #30 x 1;  Signed;  Entered by: Everitt Amber LPN;  Authorized by: Syliva Overman MD;  Method used: Printed then faxed to [facility] [address] [place], [place], [place]  [address], Ph: [phone], Fax: [phone]    Prescriptions: IONAMIN 30MG  Take 1 tablet by mouth once a day  #30 x 1   Entered by:   Everitt Amber LPN   Authorized by:   Syliva Overman MD   Signed by:   Everitt Amber LPN on 42/59/5638   Method used:   Printed then faxed to ...       [facility] (retail)       [address]       [place], [place]  [address]       Ph: [phone]       Fax: [phone]   RxID:   [id number]   Appended Document: med refaxed to [facility] because they could order it and have it shipped overnight, the other pharmacy could not

## 2011-01-27 NOTE — Letter (Signed)
Summary: Stress Echocardiogram Information Sheet  [facility]  [address] [place] [address]   Phone: [phone]  Fax: [phone]      February 03, 2010 MRN: [id number] light prior to the test.   [name]  Doctor: Appointment Date: Appointment Time: Appointment Location: [facility]  Stress Echocardiogram Information Sheet    Instructions:   1. DO NOT  take your  Spironolactone medicine the morning of the test.  2. Eat light prior to the test.  3. Dress prepared to exercise.  4. DO NOT use ANY caffine or tobacco products 3 hours before appointment.  5. Report to the Short Stay Center on the1st floor.  6. Please bring all current prescription medications.  7. If you have any questions, please call [phone]

## 2011-01-27 NOTE — Progress Notes (Signed)
Summary: medicines to [facility]  Phone Note Call from Patient   Summary of Call: neeeds her provas. synth. and flexerill send to [facility] and instead of nexium they cost to much please send in the generic for prioselic Initial call taken by: Lind Guest,  November 07, 2010 4:10 PM    New/Updated Medications: OMEPRAZOLE 40 MG CPDR (OMEPRAZOLE) one cap by mouth once daily Prescriptions: OMEPRAZOLE 40 MG CPDR (OMEPRAZOLE) one cap by mouth once daily  #90 x 0   Entered by:   Adella Hare LPN   Authorized by:   Syliva Overman MD   Signed by:   Adella Hare LPN on 16/09/9603   Method used:   Faxed to ...       [facility] (mail-order)             , [place]         Ph: [phone]       Fax: [phone]   RxID:   [id number] SYNTHROID 100 MCG TABS (LEVOTHYROXINE SODIUM) Take 1 tablet by mouth once a day  #90 x 0   Entered by:   Adella Hare LPN   Authorized by:   Syliva Overman MD   Signed by:   Adella Hare LPN on 32/44/0102   Method used:   Faxed to ...       [facility] (mail-order)             , [place]         Ph: [phone]       Fax: [phone]   RxID:   [id number] PRAVACHOL 40 MG TABS (PRAVASTATIN SODIUM) one tab by mouth at bedtime discontinue simvastatin  #90 x 0   Entered by:   Adella Hare LPN   Authorized by:   Syliva Overman MD   Signed by:   Adella Hare LPN on 06/26/1600   Method used:   Faxed to ...       [facility] (mail-order)             , [place]         Ph: [phone]       Fax: [phone]   RxID:   [id number] CYCLOBENZAPRINE HCL 10 MG TABS (CYCLOBENZAPRINE HCL) one tab by mouth tid  #270 x 0   Entered by:   Adella Hare LPN   Authorized by:   Syliva Overman MD   Signed by:   Adella Hare LPN on 71/05/2693   Method used:   Faxed to ...       [facility] (mail-order)             , [place]         Ph: [phone]       Fax: [phone]   RxID:   [id number]

## 2011-01-27 NOTE — Letter (Signed)
Summary: chart 1 consults  chart 1 consults   Imported By: Curtis Sites 08/07/2010 11:03:29  _____________________________________________________________________  External Attachment:    Type:   Image     Comment:   External Document

## 2011-01-27 NOTE — Assessment & Plan Note (Signed)
Summary: OV   Vital Signs:  Patient profile:   50 year old female Menstrual status:  hysterectomy Height:      63 inches Weight:      239.25 pounds BMI:     42.53 O2 Sat:      94 % Pulse rate:   120 / minute Pulse rhythm:   regular Resp:     16 per minute BP sitting:   120 / 78  (left arm) Cuff size:   large  Vitals Entered By: Everitt Amber LPN (June 09, 2010 3:27 PM) CC: has been burning on urination for 5 days, is completely off cymbalta now. Right hand has been swollen and numb   Primary Care Provider:  Syliva Overman MD  CC:  has been burning on urination for 5 days and is completely off cymbalta now. Right hand has been swollen and numb.  History of Present Illness: pt reports pain and tingling  up  the neck . She was recently dx with right carpal tunnel, and wants to see  a hand surgeon.She has not seen a neurosurgeon about  her neck for a while and has actually weaned off of cymbalta. she continues to gain weight and is making this a priority as far as lifestyle change to reverse this. She c/o swelling of the right hand intermittently for months, she  has been sent to cardiology about this in the past. She had her eval by oncology at [facility], and has been assured that there is no evidence of recurrent breast cancer. She has a 5 day h/o dysuria and frequency, she denies fever, chills , or flank pain.     Current Medications (verified): 1)  Miralax  Powd (Polyethylene Glycol 3350) .Marland KitchenMarland KitchenMarland Kitchen 17 Grams Two Times A Day 2)  Docusate Sodium 100 Mg Tabs (Docusate Sodium) .... Take 1 Tablet By Mouth Two Times A Day 3)  Nexium 40 Mg Cpdr (Esomeprazole Magnesium) .... Take 1 Tablet By Mouth Once Daily 4)  Vitamin D (Ergocalciferol) 50000 Unit Caps (Ergocalciferol) .... One Cap Weekly 5)  Biotin 5000 5 Mg Caps (Biotin) .... Take 1 Tablet By Mouth Two Times A Day 6)  Alprazolam 1 Mg Tabs (Alprazolam) .... Take 1 Tablet By Mouth Four Times A Day As Needed 7)  Spironolactone 25 Mg Tabs  (Spironolactone) .... One Tab By Mouth Bid 8)  Cyclobenzaprine Hcl 10 Mg Tabs (Cyclobenzaprine Hcl) .... One Tab By Mouth Tid 9)  Nitrostat 0.4 Mg Subl (Nitroglycerin) .... Uad 10)  Synthroid 150 Mcg Tabs (Levothyroxine Sodium) .... Take 1 Tablet By Mouth Once A Day 11)  Simvastatin 40 Mg Tabs (Simvastatin) .... Take 1 Tab By Mouth At Bedtime  Allergies (verified): 1)  ! Penicillin 2)  ! Keflex 3)  ! Macrodantin 4)  ! Codeine 5)  ! Sulfa 6)  ! Cleocin 7)  ! Tamiflu (Oseltamivir Phosphate) 8)  ! Dilaudid (Hydromorphone Hcl)  Review of Systems      See HPI General:  Complains of fatigue and malaise; denies chills and fever. Eyes:  Denies discharge, eye pain, and red eye. ENT:  Complains of hoarseness; denies nasal congestion and sinus pressure; chronicpainless hoarseness. CV:  Denies chest pain or discomfort, palpitations, and swelling of feet. Resp:  Denies cough and sputum productive. GI:  Denies abdominal pain, constipation, diarrhea, nausea, and vomiting. GU:  Denies dysuria and urinary frequency. MS:  Complains of stiffness and thoracic pain; denies joint pain. Derm:  Denies itching, lesion(s), and rash. Neuro:  Complains of headaches;  denies seizures and sensation of room spinning; occasional, improved. Psych:  Complains of anxiety; denies depression, panic attacks, sense of great danger, suicidal thoughts/plans, and thoughts of violence. Endo:  Complains of weight change; denies cold intolerance, excessive hunger, excessive thirst, and heat intolerance. Heme:  Denies abnormal bruising and bleeding. Allergy:  Denies hives or rash and itching eyes.  Physical Exam  General:  Well-developed,obese in  no acute distress; alert,appropriate and cooperative throughout examination HEENT: No facial asymmetry,  EOMI, No sinus tenderness, TM's Clear, oropharynx  pink and moist.   Chest: Clear to auscultation bilaterally.  CVS: S1, S2, No murmurs, No S3.   Abd: Soft, Nontender.  MS:  decreased though adeuate ROM spine,normal in hips, shoulders and knees.  Ext: No edema.   CNS: CN 2-12 intact, power tone and sensation normal throughout.   Skin: Intact, no visible lesions or rashes.  Psych: Good eye contact, normal affect.  Memory intact, not anxious or depressed appearing.    Impression & Recommendations:  Problem # 1:  HOARSENESS, CHRONIC (ZOX-096.04) Assessment Comment Only  Future Orders: ENT Referral (ENT) ... 06/10/2010  Problem # 2:  CARPAL TUNNEL SYNDROME, RIGHT (ICD-354.0) Assessment: Deteriorated  Orders: Orthopedic Referral (Ortho)  Problem # 3:  ACUTE CYSTITIS (ICD-595.0) Assessment: Comment Only  The following medications were removed from the medication list:    Azithromycin 250 Mg Tabs (Azithromycin) .Marland Kitchen... Take 2 today, then 1 daily x 4 days Her updated medication list for this problem includes:    Ciprofloxacin Hcl 500 Mg Tabs (Ciprofloxacin hcl) .Marland Kitchen... Take 1 tablet by mouth two times a day  Orders: UA Dipstick W/ Micro (manual) (54098) T-Culture, Urine ([id number])  Encouraged to push clear liquids, get enough rest, and take acetaminophen as needed. To be seen in 10 days if no improvement, sooner if worse.  Problem # 4:  FATIGUE (ICD-780.79) Assessment: Deteriorated regular exercise and good sleep hygiene encouraged  Problem # 5:  HYPOTHYROIDISM (ICD-244.9) Assessment: Comment Only  Her updated medication list for this problem includes:    Synthroid 150 Mcg Tabs (Levothyroxine sodium) ..... One tab mon wed and fri. 1/2 tab on tues, thurs, sat and sun  Orders: T-TSH ([id number]) rept labwk due  Labs Reviewed: TSH: <0.004 uIU/mL (04/01/2010)    HgBA1c: 6.0 (04/01/2010) Chol: 228 (04/01/2010)   HDL: 53 (04/01/2010)   LDL: 138 (04/01/2010)   TG: 185 (04/01/2010)  Complete Medication List: 1)  Miralax Powd (Polyethylene glycol 3350) .Marland KitchenMarland KitchenMarland Kitchen 17 grams two times a day 2)  Docusate Sodium 100 Mg Tabs (Docusate sodium) .... Take 1  tablet by mouth two times a day 3)  Nexium 40 Mg Cpdr (Esomeprazole magnesium) .... Take 1 tablet by mouth once daily 4)  Biotin 5000 5 Mg Caps (Biotin) .... Take 1 tablet by mouth two times a day 5)  Alprazolam 1 Mg Tabs (Alprazolam) .... Take 1 tablet by mouth four times a day as needed 6)  Spironolactone 25 Mg Tabs (Spironolactone) .... One tab by mouth bid 7)  Cyclobenzaprine Hcl 10 Mg Tabs (Cyclobenzaprine hcl) .... One tab by mouth tid 8)  Nitrostat 0.4 Mg Subl (Nitroglycerin) .... Uad 9)  Synthroid 150 Mcg Tabs (Levothyroxine sodium) .... One tab mon wed and fri. 1/2 tab on tues, thurs, sat and sun 10)  Simvastatin 40 Mg Tabs (Simvastatin) .... Take 1 tab by mouth at bedtime 11)  Ciprofloxacin Hcl 500 Mg Tabs (Ciprofloxacin hcl) .... Take 1 tablet by mouth two times a day  Other Orders: T-Hepatic Function [phone])  T-Lipid Profile [phone])  Patient Instructions: 1)  Please schedule a follow-up appointment in 3 months. 2)  It is important that you exercise regularly at least 20 minutes 5 times a week. If you develop chest pain, have severe difficulty breathing, or feel very tired , stop exercising immediately and seek medical attention. 3)  You need to lose weight. Consider a lower calorie diet and regular exercise.  4)  TSH prior to visit, ICD-9:   today 5)  Hepatic Panel prior to visit, ICD-9:  fasting in 3 months 6)  Lipid Panel prior to visit, ICD-9: 7)   you will be referred to a hand specialist. 8)  Pls start new med for urine infection. Prescriptions: CIPROFLOXACIN HCL 500 MG TABS (CIPROFLOXACIN HCL) Take 1 tablet by mouth two times a day  #10 x 0   Entered and Authorized by:   Syliva Overman MD   Signed by:   Syliva Overman MD on 06/09/2010   Method used:   Electronically to        [facility] (retail)       [address]       [place], [place]  [address]       Ph: [phone]       Fax: [phone]   RxID:    [id number] SIMVASTATIN 40 MG TABS (SIMVASTATIN) Take 1 tab by mouth at bedtime  #30 x 3   Entered by:   Everitt Amber LPN   Authorized by:   Syliva Overman MD   Signed by:   Everitt Amber LPN on 27/25/3664   Method used:   Electronically to        [facility] (retail)       [address]       [place], [place]  [address]       Ph: [phone]       Fax: [phone]   RxID:   [id number]   Laboratory Results   Urine Tests    Routine Urinalysis   Color: lt. yellow Appearance: Clear Glucose: negative   (Normal Range: Negative) Bilirubin: negative   (Normal Range: Negative) Ketone: negative   (Normal Range: Negative) Spec. Gravity: 1.025   (Normal Range: 1.003-1.035) Blood: negative   (Normal Range: Negative) pH: 5.0   (Normal Range: 5.0-8.0) Protein: negative   (Normal Range: Negative) Urobilinogen: 0.2   (Normal Range: 0-1) Nitrite: negative   (Normal Range: Negative) Leukocyte Esterace: small   (Normal Range: Negative)

## 2011-01-27 NOTE — Miscellaneous (Signed)
Summary: labs bmp,lipid,liver 12/26/2009  Clinical Lists Changes  Observations: Added new observation of CALCIUM: 9.1 mg/dL (60/45/4098 1:19) Added new observation of ALBUMIN: 4.2 g/dL (14/78/2956 2:13) Added new observation of PROTEIN, TOT: 6.8 g/dL (08/65/7846 9:62) Added new observation of SGPT (ALT): 11 units/L (12/26/2009 9:38) Added new observation of SGOT (AST): 16 units/L (12/26/2009 9:38) Added new observation of ALK PHOS: 89 units/L (12/26/2009 9:38) Added new observation of BILI DIRECT: 0.1 mg/dL (95/28/4132 4:40) Added new observation of CREATININE: 0.89 mg/dL (10/24/2535 6:44) Added new observation of BUN: 11 mg/dL (03/47/4259 5:63) Added new observation of BG RANDOM: 93 mg/dL (87/56/4332 9:51) Added new observation of CO2 PLSM/SER: 21 meq/L (12/26/2009 9:38) Added new observation of CL SERUM: 103 meq/L (12/26/2009 9:38) Added new observation of K SERUM: 3.9 meq/L (12/26/2009 9:38) Added new observation of NA: 140 meq/L (12/26/2009 9:38) Added new observation of LDL: 117 mg/dL (88/41/6606 3:01) Added new observation of HDL: 54 mg/dL (60/09/9322 5:57) Added new observation of TRIGLYC TOT: 213 mg/dL (32/20/2542 7:06) Added new observation of CHOLESTEROL: 214 mg/dL (23/76/2831 5:17)

## 2011-01-27 NOTE — Progress Notes (Signed)
Summary: NURSE   Phone Note Call from Patient   Summary of Call: PT NEEDS TO SPEAK WITH NURSE ABOUT SKIN TEARING.  [phone] Initial call taken by: Rudene Anda,  October 08, 2010 10:23 AM  Follow-up for Phone Call        had surgery and everything went well, had anal fissure tear and developed yeast infection now she has breakdown in vaginal area and she has used monistat and took 3 fluconazole. Has 3 small tears in vaginal area. Advised she come in to see Dawn  Follow-up by: Everitt Amber LPN,  October 08, 2010 11:07 AM

## 2011-01-27 NOTE — Progress Notes (Signed)
Summary: ELECTROMYOGRAPHY  REPORT  ELECTROMYOGRAPHY  REPORT   Imported By: Lind Guest 05/13/2010 10:26:44  _____________________________________________________________________  External Attachment:    Type:   Image     Comment:   External Document

## 2011-01-27 NOTE — Miscellaneous (Signed)
Summary: med change  Clinical Lists Changes  Medications: Added new medication of VERAPAMIL HCL 120 MG TABS (VERAPAMIL HCL) one tab every other day

## 2011-01-27 NOTE — Progress Notes (Signed)
Summary: APPOINMENT  Phone Note Call from Patient   Summary of Call: KAY FROM [facility] SAID THAT DR.BERRY CAN SEE HER FRIDAY 4.8.11 ARRIVE  AT 10:00 [address] CHECK IN ON 3RD FLOOR  CALL KAY AT [phone] EXT [phone] TO LET HER KNOW THAT YOU RECEIVED THE MESSAGE LEFT KAY A MESSAGE  4.6.11 AND Laurell IS AWARE OF THE APPOINMENT AND THE TIME  Initial call taken by: Lind Guest,  April 02, 2010 4:05 PM

## 2011-01-27 NOTE — Progress Notes (Signed)
Summary: [facility]  Phone Note Call from Patient   Summary of Call: [facility] HAD TOLD [facility] THAT THEY HAVE BEEN TRYING TO GET IN TOUCH HERE 1 WEEK AND  NEEDS SOMEONE TO CALL [facility]   [phone] ASK FOR PHY. LINE       SUPPOSED TO SHIP ON THE 11TH AND NEEDS THIS DONE ASAP   CALL BACK TODAY AT [phone] Initial call taken by: Lind Guest,  June 05, 2010 11:19 AM  Follow-up for Phone Call        She is taking the synthroid 150, 1 once daily  Faxed to [facility] Follow-up by: Everitt Amber LPN,  June 05, 7828 11:42 AM

## 2011-01-27 NOTE — Progress Notes (Signed)
Summary: SOUNDS AWFUL  Phone Note Call from Patient   Summary of Call: SOUNDS AWFUL CALL BACK AT [phone] Initial call taken by: Lind Guest,  November 24, 2010 11:50 AM  Follow-up for Phone Call        pls advise herto come in as a work in this pm, i seeopen slots Follow-up by: Syliva Overman MD,  November 24, 2010 12:54 PM  Additional Follow-up for Phone Call Additional follow up Details #1::        COMING IN AT 3:30 TODAY Additional Follow-up by: Lind Guest,  November 24, 2010 1:17 PM

## 2011-01-27 NOTE — Progress Notes (Signed)
Summary: [facility]  [facility]   Imported By: Lind Guest 08/08/2010 15:52:08  _____________________________________________________________________  External Attachment:    Type:   Image     Comment:   External Document

## 2011-01-27 NOTE — Letter (Signed)
Summary: transfered records  transfered records   Imported By: Lind Guest 08/26/2010 13:33:05  _____________________________________________________________________  External Attachment:    Type:   Image     Comment:   External Document

## 2011-01-27 NOTE — Medication Information (Signed)
Summary: Tax adviser   Imported By: Lind Guest 04/04/2010 16:30:50  _____________________________________________________________________  External Attachment:    Type:   Image     Comment:   External Document

## 2011-01-27 NOTE — Progress Notes (Signed)
Summary: cough medication too high  Phone Note Call from Patient   Summary of Call: patient states that the cough medication you called in was $160 can you give her a different prescription.  Initial call taken by: Curtis Sites,  November 24, 2010 4:43 PM  Follow-up for Phone Call        can you stamp and fax [facility] script Follow-up by: Syliva Overman MD,  November 24, 2010 5:06 PM    New/Updated Medications: * APOTHECARY SYRUP one teaspoon every 6 to 8 hours as needed for cough Prescriptions: APOTHECARY SYRUP one teaspoon every 6 to 8 hours as needed for cough  #8 ounces x 0   Entered and Authorized by:   Syliva Overman MD   Signed by:   Syliva Overman MD on 11/24/2010   Method used:   Printed then faxed to ...       [facility] (retail)       [address]       [place], [place]  [address]       Ph: [phone]       Fax: [phone]   RxID:   [id number]

## 2011-01-27 NOTE — Progress Notes (Signed)
Summary: results of [place]  Phone Note Call from Patient   Summary of Call: pt would like to get results of thyroid US. [phone] Initial call taken by: Rudene Anda,  September 17, 2010 11:37 AM  Follow-up for Phone Call        advised normal Follow-up by: Adella Hare LPN,  September 17, 2010 2:24 PM

## 2011-01-27 NOTE — Letter (Signed)
Summary: [facility]   Imported By: Lind Guest 09/11/2010 09:29:02  _____________________________________________________________________  External Attachment:    Type:   Image     Comment:   External Document

## 2011-01-27 NOTE — Assessment & Plan Note (Signed)
Summary: OV   Vital Signs:  Patient profile:   50 year old female Menstrual status:  hysterectomy Height:      63 inches Weight:      243.50 pounds BMI:     43.29 O2 Sat:      98 % on Room air Temp:     99 degrees F oral Pulse rate:   110 / minute Pulse rhythm:   regular Resp:     16 per minute BP sitting:   102 / 68  (left arm)  Vitals Entered By: Adella Hare LPN (November 24, 2010 3:37 PM)  Nutrition Counseling: Patient's BMI is greater than 25 and therefore counseled on weight management options.  O2 Flow:  Room air CC: cough fever body aches chils and sinus pressure x 2 days Is Patient Diabetic? No Comments did not bring meds to ov   Primary Care Provider:  Syliva Overman MD  CC:  cough fever body aches chils and sinus pressure x 2 days.  History of Present Illness: 3 day h/o increased PND, clear, generalised body aches, right nasal passage stuffed and blocked, excessive cough, mainly non productive, chest tightness, difficulty breathing H/o asthma in childhood Generalised body aches, up to 101, has been taking tylenol every 4 hours Reports  that prior to this she had been doing well.  Denies chest pain, palpitations, PND, orthopnea or leg swelling. Denies abdominal pain, nausea, vomitting, diarrhea or constipation. Denies change in bowel movements or bloody stool. Denies dysuria , frequency, incontinence or hesitancy.  Denies depression,has chronic  anxiety denies nsomnia. Denies  rash, lesions, or itch.     Allergies (verified): 1)  ! Penicillin 2)  ! Keflex 3)  ! Macrodantin 4)  ! Codeine 5)  ! Sulfa 6)  ! Cleocin 7)  ! Tamiflu (Oseltamivir Phosphate) 8)  ! Dilaudid (Hydromorphone Hcl) 9)  ! Biaxin  Review of Systems      See HPI General:  Complains of chills, fatigue, and malaise. Eyes:  Denies discharge, eye pain, and red eye. MS:  Complains of joint pain, low back pain, mid back pain, and stiffness. Psych:  Complains of anxiety; denies  irritability, mental problems, suicidal thoughts/plans, thoughts of violence, and thoughts /plans of harming others. Endo:  Denies cold intolerance, excessive thirst, excessive urination, and heat intolerance. Heme:  Denies abnormal bruising and bleeding. Allergy:  Complains of seasonal allergies.  Physical Exam  General:  Ill appearing obese female .Marland Kitchen HEENT: No facial asymmetry,  EOMI, sius sinus tenderness, TM's Clear, oropharynx  pink and moist. neck decreased ROM  Chest:decreased air entry, scattered crackles, few wheezes CVS: S1, S2, No murmurs, No S3.   Abd: Soft, Nontender.  MS: Adequate ROM spine, hips, shoulders and knees.  Ext: No edema.   CNS: CN 2-12 intact, power tone and sensation normal throughout.   Skin: Intact, no visible lesions or rashes.  Psych: Good eye contact, normal affect.  Memory intact, not anxious or depressed appearing.     Impression & Recommendations:  Problem # 1:  ACUTE BRONCHITIS (ICD-466.0) Assessment Comment Only  apothecary syrup prescribed, tussionex not covered  Orders: Medicare Electronic Prescription [phone])  Problem # 2:  ACUTE MAXILLARY SINUSITIS (ICD-461.0) Assessment: Comment Only  Orders: Medicare Electronic Prescription [phone])  Problem # 3:  HYPERLIPIDEMIA (ICD-272.4) Assessment: Unchanged  Her updated medication list for this problem includes:    Pravachol 40 Mg Tabs (Pravastatin sodium) ..... One tab by mouth at bedtime discontinue simvastatin  Labs Reviewed: SGOT: 16 (09/09/2010)  SGPT: 13 (09/09/2010)   HDL:41 (09/09/2010), 48 (06/10/2010)  LDL:76 (09/09/2010), 81 (06/10/2010)  Chol:170 (09/09/2010), 172 (06/10/2010)  Trig:264 (09/09/2010), 216 (06/10/2010)  Problem # 4:  UNSPECIFIED HYPOTHYROIDISM (ICD-244.9) Assessment: Comment Only  Her updated medication list for this problem includes:    Synthroid 100 Mcg Tabs (Levothyroxine sodium) .Marland Kitchen... Take 1 tablet by mouth once a day  Labs Reviewed: TSH: 0.031  (09/09/2010)    HgBA1c: 5.9 (09/09/2010) Chol: 170 (09/09/2010)   HDL: 41 (09/09/2010)   LDL: 76 (09/09/2010)   TG: 264 (09/09/2010)  Problem # 5:  BACK PAIN, CHRONIC (ICD-724.5) Assessment: Deteriorated  Her updated medication list for this problem includes:    Cyclobenzaprine Hcl 10 Mg Tabs (Cyclobenzaprine hcl) ..... One tab by mouth tid  Orders: Depo- Medrol 80mg  (J1040) Ketorolac-Toradol 15mg  (U9811) Admin of Therapeutic Inj  intramuscular or subcutaneous (91478)  Complete Medication List: 1)  Miralax Powd (Polyethylene glycol 3350) .Marland KitchenMarland KitchenMarland Kitchen 17 grams two times a day 2)  Docusate Sodium 100 Mg Tabs (Docusate sodium) .... Take 1 tablet by mouth two times a day 3)  Nexium 40 Mg Cpdr (Esomeprazole magnesium) .... Take 1 tablet by mouth once daily 4)  Biotin 5000 5 Mg Caps (Biotin) .... Take 1 tablet by mouth two times a day 5)  Alprazolam 1 Mg Tabs (Alprazolam) .... Take 1 tablet by mouth four times a day as needed 6)  Spironolactone 25 Mg Tabs (Spironolactone) .... One tab by mouth bid 7)  Cyclobenzaprine Hcl 10 Mg Tabs (Cyclobenzaprine hcl) .... One tab by mouth tid 8)  Nitrostat 0.4 Mg Subl (Nitroglycerin) .... Uad 9)  Verapamil Hcl 120 Mg Tabs (Verapamil hcl) .... One tab every other day 10)  Pravachol 40 Mg Tabs (Pravastatin sodium) .... One tab by mouth at bedtime discontinue simvastatin 11)  Synthroid 100 Mcg Tabs (Levothyroxine sodium) .... Take 1 tablet by mouth once a day 12)  Vitamin D3 2000 Unit Caps (Cholecalciferol) .Marland Kitchen.. 1 by mouth once daily 13)  Vitamin E 1000 Unit Caps (Vitamin e) .Marland Kitchen.. 1 by mouth once daily 14)  Vitamin C 1000 Mg Tabs (Ascorbic acid) .Marland Kitchen.. 1 by mouth once daily 15)  Vitamin B-12 1000 Mcg Tabs (Cyanocobalamin) .Marland Kitchen.. 1 by mouth once daily 16)  Phentermine Hcl 37.5 Mg Caps (Phentermine hcl) .... Take 1 tablet by mouth once a day 17)  Omeprazole 40 Mg Cpdr (Omeprazole) .... One cap by mouth once daily 18)  Antivert 25 Mg Tabs (Meclizine hcl) .... Take 1  tablet by mouth three times a day as needed 19)  Fluconazole 150 Mg Tabs (Fluconazole) .... Take 1 tablet by mouth once a day as needed vaginal itch 20)  Apothecary Syrup  .... One teaspoon every 6 to 8 hours as needed for cough  Patient Instructions: 1)  F/u as before. 2)  You are being treated for sinusitis and bronchitis. 3)  Meds are sent in to your pharmacy. 4)  you will get injections in the office today also Prescriptions: FLUCONAZOLE 150 MG TABS (FLUCONAZOLE) Take 1 tablet by mouth once a day as needed vaginal itch  #3 x 0   Entered and Authorized by:   Syliva Overman MD   Signed by:   Syliva Overman MD on 11/24/2010   Method used:   Electronically to        [facility] (retail)       [address]       [place], [place]  [address]  Ph: [phone]       Fax: [phone]   RxID:   [id number] Sandria Senter ER 10-8 MG/5ML LQCR (HYDROCOD POLST-CHLORPHEN POLST) one teaspoon twice daily as needed for excessive cough  #300cc x 0   Entered and Authorized by:   Syliva Overman MD   Signed by:   Syliva Overman MD on 11/24/2010   Method used:   Printed then faxed to ...       [facility] (retail)       [address]       [place], [place]  [address]       Ph: [phone]       Fax: [phone]   RxID:   [id number] PREDNISONE (PAK) 5 MG TABS (PREDNISONE) Use as directed  #21 x 0   Entered and Authorized by:   Syliva Overman MD   Signed by:   Syliva Overman MD on 11/24/2010   Method used:   Electronically to        [facility] (retail)       [address]       [place], [place]  [address]       Ph: [phone]       Fax: [phone]   RxID:   [id number] ANTIVERT 25 MG TABS (MECLIZINE HCL) Take 1 tablet by mouth three times a day as needed  #30 x 0   Entered and Authorized by:   Syliva Overman MD   Signed by:   Syliva Overman MD on 11/24/2010   Method used:   Electronically to        [facility] (retail)       [address]       [place], [place]  [address]       Ph: [phone]       Fax: [phone]   RxID:   [id number] ZITHROMAX Z-PAK 250 MG TABS (AZITHROMYCIN) Use as directed  #21 x 0   Entered and Authorized by:   Syliva Overman MD   Signed by:   Syliva Overman MD on 11/24/2010   Method used:   Electronically to        [facility] (retail)       [address]       [place], [place]  [address]       Ph: [phone]       Fax: [phone]   RxID:   [id number]    Medication Administration  Injection # 1:    Medication: Depo- Medrol 80mg     Diagnosis: BACK PAIN, CHRONIC (ICD-724.5)    Route: IM    Site: RUOQ gluteus    Exp Date: 06/12    Lot #: [id number]    Mfr: Pharmacia    Patient tolerated injection without complications    Given by: Adella Hare LPN (November 24, 2010 4:18 PM)  Injection # 2:    Medication: Ketorolac-Toradol 15mg     Diagnosis: BACK PAIN, CHRONIC (ICD-724.5)    Route: IM    Site: RUOQ gluteus    Exp Date: 10/29/2011    Lot #: [id number]    Mfr: [email or web address]    Comments: toradol 60mg  given    Patient tolerated injection without complications    Given by: Adella Hare LPN (November 24, 2010 4:18 PM)  Orders Added: 1)  Est. Patient Level IV [10272] 2)  Medicare Electronic Prescription [G8553] 3)  Depo- Medrol 80mg  [J1040] 4)  Ketorolac-Toradol 15mg  [J1885] 5)  Admin of Therapeutic Inj  intramuscular or subcutaneous [96372]     Medication Administration  Injection # 1:    Medication: Depo- Medrol 80mg     Diagnosis: BACK PAIN, CHRONIC (ICD-724.5)    Route: IM    Site: RUOQ gluteus    Exp Date: 06/12    Lot #: [id number]    Mfr: Pharmacia    Patient tolerated injection without complications    Given by: Adella Hare LPN (November 24, 2010 4:18 PM)  Injection # 2:     Medication: Ketorolac-Toradol 15mg     Diagnosis: BACK PAIN, CHRONIC (ICD-724.5)    Route: IM    Site: RUOQ gluteus    Exp Date: 10/29/2011    Lot #: [id number]    Mfr: [email or web address]    Comments: toradol 60mg  given    Patient tolerated injection without complications    Given by: Adella Hare LPN (November 24, 2010 4:18 PM)  Orders Added: 1)  Est. Patient Level IV [34742] 2)  Medicare Electronic Prescription [G8553] 3)  Depo- Medrol 80mg  [J1040] 4)  Ketorolac-Toradol 15mg  [J1885] 5)  Admin of Therapeutic Inj  intramuscular or subcutaneous [59563]

## 2011-01-27 NOTE — Progress Notes (Signed)
Summary: medication  Phone Note Call from Patient   Summary of Call: patient would like 90 days supply for simvastin 40 mg, and nexium 40 mg. She would like to start using  [facility]  Initial call taken by: Curtis Sites,  July 03, 2010 1:58 PM  Follow-up for Phone Call        Rx Called In Follow-up by: Adella Hare LPN,  July 04, 3663 4:38 PM    Prescriptions: SIMVASTATIN 40 MG TABS (SIMVASTATIN) Take 1 tab by mouth at bedtime  #90 x 0   Entered by:   Adella Hare LPN   Authorized by:   Syliva Overman MD   Signed by:   Adella Hare LPN on 40/34/7425   Method used:   Faxed to ...       [facility] (mail-order)             , [place]         Ph: [phone]       Fax: [phone]   RxID:   [id number] NEXIUM 40 MG CPDR (ESOMEPRAZOLE MAGNESIUM) Take 1 tablet by mouth once daily  #90 x 0   Entered by:   Adella Hare LPN   Authorized by:   Syliva Overman MD   Signed by:   Adella Hare LPN on 73/22/0254   Method used:   Faxed to ...       [facility] (mail-order)             , [place]         Ph: [phone]       Fax: [phone]   RxID:   [id number]

## 2011-01-27 NOTE — Progress Notes (Signed)
Summary: results  Phone Note Call from Patient   Summary of Call: pt would like for you to call [facility] and get her results of MRI before the weekend. Said she would be worried about it all weekend if she don't know anything by tomorrow. [phone] Initial call taken by: Rudene Anda,  April 03, 2010 2:29 PM  Follow-up for Phone Call        pls let her know that as soon as they are read they will be available and then I will be able to let her know Follow-up by: Syliva Overman MD,  April 03, 2010 2:42 PM  Additional Follow-up for Phone Call Additional follow up Details #1::        pt aadvised no mention of metastatic ds, only pelviclesion (which is not new) and bulging disc, she will collect a copy of the report Additional Follow-up by: Syliva Overman MD,  April 03, 2010 6:45 PM

## 2011-01-27 NOTE — Progress Notes (Signed)
  Phone Note Call from Patient   Summary of Call: Wants you to know that she went to the beach and the sun caused her to break out in a bad rash and she doesn't know if it was the cymbalta or what. Is there side effects with sun exposure with any of her meds? Initial call taken by: Everitt Amber LPN,  April 15, 2010 1:36 PM  Follow-up for Phone Call        The only med on her list that I am aware of that can have sun sensitivity is Promethazine.  Usually if there is a rxn it will be like a sunburn.  She can dble check with her pharmacist about her meds too. I would recommend lots of sunscreen, spf 30 or more, and avoid sun exposure as much as possible. Follow-up by: Esperanza Sheets PA,  April 16, 2010 9:43 AM  Additional Follow-up for Phone Call Additional follow up Details #1::        patient aware Additional Follow-up by: Adella Hare LPN,  April 16, 2010 9:57 AM    New/Updated Medications: SYNTHROID 175 MCG TABS (LEVOTHYROXINE SODIUM) Take 1 tablet by mouth once a day

## 2011-01-27 NOTE — Letter (Signed)
Summary: labs  labs   Imported By: Curtis Sites 08/07/2010 11:57:53  _____________________________________________________________________  External Attachment:    Type:   Image     Comment:   External Document

## 2011-01-27 NOTE — Letter (Signed)
Summary: [facility]   [facility]   Imported By: Lind Guest 11/06/2010 12:54:16  _____________________________________________________________________  External Attachment:    Type:   Image     Comment:   External Document

## 2011-01-27 NOTE — Progress Notes (Signed)
Summary: injections  Phone Note Call from Patient   Summary of Call: would like to come in and get steroid shots in back for pain. is this ok? [phone] Initial call taken by: Rudene Anda,  March 19, 2010 9:57 AM  Follow-up for Phone Call        comin in for ov today Follow-up by: Adella Hare LPN,  March 19, 2010 10:54 AM

## 2011-01-27 NOTE — Progress Notes (Signed)
Summary: referral  Phone Note Call from Patient   Summary of Call: pt would like to see dr. Mariel Sleet will you please refer her there. [phone] Initial call taken by: Rudene Anda,  May 05, 2010 11:04 AM  Follow-up for Phone Call        This pt doesn't need to go to [facility] now. She is seeing Dr. Warrick Parisian on 05/16/2010. this girl at front desk at there office made a mistake and they made her the appt again. Also she would like to let doc know that [place] said she had carpal tunnel and that was what was wrong. pt doesn't think so and wants to be referred to dr. Danielle Dess her neuro. Is thsi ok? Follow-up by: Rudene Anda,  May 12, 2010 3:45 PM  Additional Follow-up for Phone Call Additional follow up Details #1::        pls refer to dr elsner Additional Follow-up by: Syliva Overman MD,  May 12, 2010 4:16 PM    Additional Follow-up for Phone Call Additional follow up Details #2::    pt was referred to dr. Danielle Dess office. they will call and set up appt and time for her. pt aware Follow-up by: Rudene Anda,  May 13, 2010 8:36 AM

## 2011-01-27 NOTE — Letter (Signed)
[facility]   Im[facility] By: Lind Guest 01/17/2010 09:37:40  _____________________________________________________________________  External Attachment:    Type:   Image     Comment:   External Document

## 2011-01-27 NOTE — Letter (Signed)
Summary: chart 2 consults  chart 2 consults   Imported By: Curtis Sites 08/07/2010 11:56:00  _____________________________________________________________________  External Attachment:    Type:   Image     Comment:   External Document

## 2011-01-27 NOTE — Assessment & Plan Note (Signed)
Summary: PT HAVING PROBLEMS W/RAPID HEART BEAT PER PT REQUEST/TG  Medications Added NEXIUM 40 MG CPDR (ESOMEPRAZOLE MAGNESIUM) Take 1 tablet by mouth once daily VERELAN 120 MG XR24H-CAP (VERAPAMIL HCL) 1 tab by mouth two times a day TOPAMAX 50 MG TABS (TOPIRAMATE) as needed PROMETHAZINE HCL 25 MG TABS (PROMETHAZINE HCL) as needed SYNTHROID 175 MCG TABS (LEVOTHYROXINE SODIUM) 1 tab in am        Referring Provider:  Syliva Overman Primary Provider:  Syliva Overman MD  CC:  chest pain.  History of Present Illness: [name] is a  50 obese CF with MULTIPLE medical problems.  She is here with complaints of recurrent chest pain and palpatations.  She was last seen by Dr. Sherryl Manges in January of 2010.  She has a history of palpatations and has been on Verelan 120mg  daily, but has had "breakthrough" palpatations and she has increased the dose to two times a day on her own for the last 3 months. She has had multiple hospitalizations secondary to GI issues with bowel resection at [facility], along with PE requiring [place] filter.Not a coumadin candidate seconday to severe GIB. She has had breast cancer, and has had partial reconstruction of L breast, but has not followed up with completion of this secondary to GI problems.  Now wants to proceed with breast surgery, but recurrent chest pain and palpatations have caused her concern.  She states that the palpatations have been occuring every day, sometimes more than once a day.  They are associated with chest pain, midsternal tightness radiating up into neck and down left arm.  Not always associated with activity.  She states that she sometimes has trouble breathing with the discomfort. She describres this as 10/10, but has not taken NTG for relief.  She states it drops her BP too low and makes her dizzy.  Review of her records state that she had a cardiac cath in 2004 that was normal.  Echocardiogram in 12/2008 was normal with EF of 65%.   She is quite anxious and want to "get to the bottom of this" so I can move on.  Current Medications (verified): 1)  Cymbalta 60 Mg Cpep (Duloxetine Hcl) .... 2 Tabs Daily 2)  Miralax  Powd (Polyethylene Glycol 3350) .Marland KitchenMarland KitchenMarland Kitchen 17 Grams Two Times A Day 3)  Docusate Sodium 100 Mg Tabs (Docusate Sodium) .... Take 1 Tablet By Mouth Two Times A Day 4)  Nexium 40 Mg Cpdr (Esomeprazole Magnesium) .... Take 1 Tablet By Mouth Once Daily 5)  Vitamin D (Ergocalciferol) 50000 Unit Caps (Ergocalciferol) .... One Cap Weekly 6)  Biotin 5000 5 Mg Caps (Biotin) .... Take 1 Tablet By Mouth Two Times A Day 7)  Alprazolam 1 Mg Tabs (Alprazolam) .... Take 1 Tablet By Mouth Four Times A Day As Needed 8)  Verelan 120 Mg Xr24h-Cap (Verapamil Hcl) .Marland Kitchen.. 1 Tab By Mouth Two Times A Day 9)  Topamax 50 Mg Tabs (Topiramate) .... As Needed 10)  Promethazine Hcl 25 Mg Tabs (Promethazine Hcl) .... As Needed 11)  Lovastatin 40 Mg Tabs (Lovastatin) .... One Tab By Mouth Qhs 12)  Spironolactone 25 Mg Tabs (Spironolactone) .... One Tab By Mouth Bid 13)  Cyclobenzaprine Hcl 10 Mg Tabs (Cyclobenzaprine Hcl) .... One Tab By Mouth Tid 14)  Nitrostat 0.4 Mg Subl (Nitroglycerin) .... Uad 15)  Synthroid 175 Mcg Tabs (Levothyroxine Sodium) .Marland Kitchen.. 1 Tab in Am  Allergies: 1)  ! Penicillin 2)  ! Keflex 3)  ! Macrodantin 4)  !  Codeine 5)  ! Sulfa 6)  ! Cleocin 7)  ! Tamiflu (Oseltamivir Phosphate) 8)  ! Dilaudid (Hydromorphone Hcl) PMH-FH-SH reviewed-no changes except otherwise noted  Review of Systems       Recurrent palpations.  All other systems have been reviewed and are negative unless stated above.   Vital Signs:  Patient profile:   50 year old female Menstrual status:  hysterectomy Weight:      229 pounds Pulse rate:   103 / minute BP sitting:   113 / 77  (right arm)  Vitals Entered ByLarita Fife Via LPN (February 03, 2010 3:09 PM)  Physical Exam  General:  Well developed, well nourished, in no acute distress. Head:   normocephalic and atraumatic Eyes:  PERRLA/EOM intact; conjunctiva and lids normal. Ears:  TM's intact and clear with normal canals and hearing Nose:  no deformity, discharge, inflammation, or lesions Mouth:  Teeth, gums and palate normal. Oral mucosa normal. Neck:  Neck supple, no JVD. No masses, thyromegaly or abnormal cervical nodes. Breasts:  L breast well healed scar without nipple/aerola Lungs:  Clear bilaterally to auscultation and percussion. Heart:  Tachycardic, no MRG Abdomen:  Midline scar from sternal notch to pubis.  Normal bowel sounds. Msk:  Back normal, normal gait. Muscle strength and tone normal. Pulses:  pulses normal in all 4 extremities Extremities:  No clubbing or cyanosis. Neurologic:  Alert and oriented x 3. Skin:  Multiple incisional scars from previous operations. Psych:  anxious.     EKG  Procedure date:  02/03/2010  Findings:      Unusual P axis and short PR.  Inferior infarct possible.  Rate of 99bpm  Impression & Recommendations:  Problem # 1:  CHEST PAIN (ICD-786.50) I have discussed this patient with Dr. Dietrich Pates.  He has recommended a stress echo .  This has been explained to the patient and she is willing to proceed.  There will be no changes in her medications at this time. Her updated medication list for this problem includes:    Verelan 120 Mg Xr24h-cap (Verapamil hcl) .Marland Kitchen... 1 tab by mouth two times a day    Nitrostat 0.4 Mg Subl (Nitroglycerin) ..... Uad  Orders: Cardionet/Event Monitor (Cardionet/Event) Stress Echo (Stress Echo)  Problem # 2:  UNSPECIFIED TACHYCARDIA (ICD-785.0) I will plan a 21 day cardionet monitor to capture and unusual rhythm or tachyarrythimias. Orders: Cardionet/Event Monitor (Cardionet/Event) Stress Echo (Stress Echo)  Patient Instructions: 1)  Your physician recommends that you schedule a follow-up appointment in: 1 month 2)  Your physician recommends that you continue on your current medications as directed.  Please refer to the Current Medication list given to you today. 3)  Your physician has requested that you have a stress echocardiogram. For further information please visit [email or web address]  Please follow instruction sheet as given. 4)  Your physician has recommended that you wear an event monitor.  Event monitors are medical devices that record the heart's electrical activity. Doctors most often use these monitors to diagnose arrhythmias. Arrhythmias are problems with the speed or rhythm of the heartbeat. The monitor is a small, portable device. You can wear one while you do your normal daily activities. This is usually used to diagnose what is causing palpitations/syncope (passing out).

## 2011-01-27 NOTE — Progress Notes (Signed)
Summary:  speak with nurse  Phone Note Call from Patient   Summary of Call: pt stated athat doc didn't send her in some meds. [phone]  Initial call taken by: Rudene Anda,  June 23, 2010 2:44 PM  Follow-up for Phone Call        were  you gonna try the appetite suppressant, inomin? states should she be takig more thyroid meds? states feels like she isnt taking enough Follow-up by: Adella Hare LPN,  June 23, 2010 4:59 PM  Additional Follow-up for Phone Call Additional follow up Details #1::        pls let her know that the med has been sent in it is phentermine 30mg  , that's the same drug i checked with the pharmacy Additional Follow-up by: Syliva Overman MD,  June 24, 2010 1:21 PM    Additional Follow-up for Phone Call Additional follow up Details #2::    returned call, left message Follow-up by: Adella Hare LPN,  June 25, 2010 10:10 AM  New/Updated Medications: PHENTERMINE HCL 30 MG CAPS (PHENTERMINE HCL) Take 1 capsule by mouth once a day Prescriptions: PHENTERMINE HCL 30 MG CAPS (PHENTERMINE HCL) Take 1 capsule by mouth once a day  #30 x 1   Entered and Authorized by:   Syliva Overman MD   Signed by:   Syliva Overman MD on 06/24/2010   Method used:   Printed then faxed to ...       [facility] (retail)       [address]       [place], [place]  [address]       Ph: [phone]       Fax: [phone]   RxID:   [id number]

## 2011-01-27 NOTE — Miscellaneous (Signed)
Summary: med change  Clinical Lists Changes  Medications: Changed medication from SYNTHROID 150 MCG TABS (LEVOTHYROXINE SODIUM) Take 1 tablet by mouth once a day to SYNTHROID 150 MCG TABS (LEVOTHYROXINE SODIUM) One tab mon wed and fri. 1/2 tab on tues, thurs, sat and sun

## 2011-01-27 NOTE — Progress Notes (Signed)
Summary: CALL  Phone Note Call from Patient   Summary of Call: [name] TO CALL HER AT [phone] Initial call taken by: Lind Guest,  April 01, 2010 1:35 PM  Follow-up for Phone Call        Wants to see if she can get into dr. Guadalupe Dawn (spelling?) earlier. She is supposed to go to [place] that week and doesn't want to miss it. Told her I would check  Follow-up by: Everitt Amber LPN,  April 02, 2010 1:06 PM  Additional Follow-up for Phone Call Additional follow up Details #1::        they will put pt on cancellation list. called pt and left message for her with husband.  Additional Follow-up by: Rudene Anda,  April 02, 2010 2:43 PM

## 2011-01-27 NOTE — Progress Notes (Signed)
Summary: Question from physician regarding management of IVC filter   Phone Note From Other Clinic   Caller: Provider Call For: Dr. Dietrich Pates Request: Talk with Provider Action Taken: Phone Call Completed Summary of Call: Patient told Dr. Lodema Hong that she should be treated with Lovenox immediately prior to any airplane travel as the result of a previously inserted IVC filter.  That device was implanted at either [facility], [facility] or [facility].  Patient currently receives no antithrombotic or anticoagulant drugs.  I suggested reassessment by the physician who initially implanted the device.  From my point of view, anticoagulation is not necessary specifically for airplane travel-frequent ambulation during the flight is recommended. Initial call taken by: Kathlen Brunswick, MD, [facility],  April 09, 2010 10:16 AM

## 2011-01-27 NOTE — Progress Notes (Signed)
Summary: RETURNED CALL  Phone Note Call from Patient   Summary of Call: RETURNED [name] BACK AT [phone] OR [phone] Initial call taken by: Lind Guest,  July 07, 2010 1:27 PM  Follow-up for Phone Call        patient aware Follow-up by: Adella Hare LPN,  July 07, 2010 3:48 PM

## 2011-01-27 NOTE — Letter (Signed)
Summary: MEDICAL RELEASE  MEDICAL RELEASE   Imported By: Lind Guest 04/04/2010 11:40:41  _____________________________________________________________________  External Attachment:    Type:   Image     Comment:   External Document

## 2011-01-27 NOTE — Letter (Signed)
Summary: chart 1 misc.  chart 1 misc.   Imported By: Curtis Sites 08/07/2010 11:03:59  _____________________________________________________________________  External Attachment:    Type:   Image     Comment:   External Document

## 2011-01-27 NOTE — Letter (Signed)
Summary: history and physical  history and physical   Imported By: Curtis Sites 08/07/2010 11:57:33  _____________________________________________________________________  External Attachment:    Type:   Image     Comment:   External Document

## 2011-01-27 NOTE — Assessment & Plan Note (Signed)
Summary: sick- room 2   Vital Signs:  Patient profile:   50 year old female Menstrual status:  hysterectomy Height:      63 inches Weight:      231.50 pounds BMI:     41.16 O2 Sat:      98 % Pulse rate:   105 / minute Resp:     16 per minute BP sitting:   110 / 80  (left arm)  Vitals Entered By: Adella Hare LPN (February 18, 2010 10:58 AM) CC: sore throat, cough, ear aches, back and side aches Is Patient Diabetic? No Pain Assessment      Location: back and side Intensity: 6 Type: aching Onset of pain  Chronic   Primary Provider:  Syliva Overman MD  CC:  sore throat, cough, ear aches, and back and side aches.  History of Present Illness: Pt c/o 2 wks of cold syptoms.  States they are worsening.  She has green nasal mucus, sinus pressure off & on, and a nonprod cough.  She denies post nasal drainage or fever.  She has been taking some Sudafed for congestion & complains that at times the inside of her nose feels quite dry.  Pt also has a hx of Chronic LBP.  She is requesting injections of Toradol & Depo Medrol.  She periodically receives these.  She states that the cough seems to have flared up her back pain.  Current Medications (verified): 1)  Cymbalta 60 Mg Cpep (Duloxetine Hcl) .... 2 Tabs Daily 2)  Miralax  Powd (Polyethylene Glycol 3350) .Marland KitchenMarland KitchenMarland Kitchen 17 Grams Two Times A Day 3)  Docusate Sodium 100 Mg Tabs (Docusate Sodium) .... Take 1 Tablet By Mouth Two Times A Day 4)  Nexium 40 Mg Cpdr (Esomeprazole Magnesium) .... Take 1 Tablet By Mouth Once Daily 5)  Vitamin D (Ergocalciferol) 50000 Unit Caps (Ergocalciferol) .... One Cap Weekly 6)  Biotin 5000 5 Mg Caps (Biotin) .... Take 1 Tablet By Mouth Two Times A Day 7)  Alprazolam 1 Mg Tabs (Alprazolam) .... Take 1 Tablet By Mouth Four Times A Day As Needed 8)  Verelan 120 Mg Xr24h-Cap (Verapamil Hcl) .Marland Kitchen.. 1 Tab By Mouth Two Times A Day 9)  Topamax 50 Mg Tabs (Topiramate) .... As Needed 10)  Promethazine Hcl 25 Mg Tabs  (Promethazine Hcl) .... As Needed 11)  Lovastatin 40 Mg Tabs (Lovastatin) .... One Tab By Mouth Qhs 12)  Spironolactone 25 Mg Tabs (Spironolactone) .... One Tab By Mouth Bid 13)  Cyclobenzaprine Hcl 10 Mg Tabs (Cyclobenzaprine Hcl) .... One Tab By Mouth Tid 14)  Nitrostat 0.4 Mg Subl (Nitroglycerin) .... Uad 15)  Synthroid 175 Mcg Tabs (Levothyroxine Sodium) .Marland Kitchen.. 1 Tab in Am  Allergies (verified): 1)  ! Penicillin 2)  ! Keflex 3)  ! Macrodantin 4)  ! Codeine 5)  ! Sulfa 6)  ! Cleocin 7)  ! Tamiflu (Oseltamivir Phosphate) 8)  ! Dilaudid (Hydromorphone Hcl) PMH reviewed for relevance  Review of Systems General:  Complains of fatigue; denies chills and fever. ENT:  Complains of nasal congestion and sinus pressure; denies earache, postnasal drainage, and sore throat. CV:  Denies chest pain or discomfort. Resp:  Complains of cough; denies coughing up blood, shortness of breath, and sputum productive. MS:  Complains of low back pain. Heme:  Denies enlarge lymph nodes and fevers.  Physical Exam  General:  Appears ill, but NAD.alert, well-developed, well-nourished, and well-hydrated.   Head:  Normocephalic and atraumatic without obvious abnormalities. No apparent alopecia or  balding. Ears:  External ear exam shows no significant lesions or deformities.  Otoscopic examination reveals clear canals, tympanic membranes are intact bilaterally without bulging, retraction, inflammation or discharge. Hearing is grossly normal bilaterally. Nose:  mucosal erythema and mucosal edema with yellow mucus   Mouth:  Oral mucosa and oropharynx without lesions or exudates.   Neck:  No deformities, masses, or tenderness noted. Lungs:  Normal respiratory effort, chest expands symmetrically. Lungs are clear to auscultation, no crackles or wheezes. Heart:  Normal rate and regular rhythm. S1 and S2 normal without gallop, murmur, click, rub or other extra sounds. Cervical Nodes:  No lymphadenopathy noted Psych:   Cognition and judgment appear intact. Alert and cooperative with normal attention span and concentration. No apparent delusions, illusions, hallucinations   Impression & Recommendations:  Problem # 1:  SINUSITIS, ACUTE (ICD-461.9) Assessment New  Her updated medication list for this problem includes:    Benzonatate 100 Mg Caps (Benzonatate) .Marland Kitchen... 1-2 caps three times a day as needed  for cough    Azithromycin 250 Mg Tabs (Azithromycin) .Marland Kitchen... Take 2 today, then 1 daily x 4 days  Problem # 2:  COUGH (ICD-786.2) Assessment: New  Problem # 3:  BACK PAIN, CHRONIC (ICD-724.5) Assessment: Deteriorated  Her updated medication list for this problem includes:    Cyclobenzaprine Hcl 10 Mg Tabs (Cyclobenzaprine hcl) ..... One tab by mouth tid  Orders: Depo- Medrol 80mg  (J1040) Ketorolac-Toradol 15mg  (Y7829)  Complete Medication List: 1)  Cymbalta 60 Mg Cpep (Duloxetine hcl) .... 2 tabs daily 2)  Miralax Powd (Polyethylene glycol 3350) .Marland KitchenMarland KitchenMarland Kitchen 17 grams two times a day 3)  Docusate Sodium 100 Mg Tabs (Docusate sodium) .... Take 1 tablet by mouth two times a day 4)  Nexium 40 Mg Cpdr (Esomeprazole magnesium) .... Take 1 tablet by mouth once daily 5)  Vitamin D (ergocalciferol) 50000 Unit Caps (Ergocalciferol) .... One cap weekly 6)  Biotin 5000 5 Mg Caps (Biotin) .... Take 1 tablet by mouth two times a day 7)  Alprazolam 1 Mg Tabs (Alprazolam) .... Take 1 tablet by mouth four times a day as needed 8)  Verelan 120 Mg Xr24h-cap (Verapamil hcl) .Marland Kitchen.. 1 tab by mouth two times a day 9)  Topamax 50 Mg Tabs (Topiramate) .... As needed 10)  Promethazine Hcl 25 Mg Tabs (Promethazine hcl) .... As needed 11)  Lovastatin 40 Mg Tabs (Lovastatin) .... One tab by mouth qhs 12)  Spironolactone 25 Mg Tabs (Spironolactone) .... One tab by mouth bid 13)  Cyclobenzaprine Hcl 10 Mg Tabs (Cyclobenzaprine hcl) .... One tab by mouth tid 14)  Nitrostat 0.4 Mg Subl (Nitroglycerin) .... Uad 15)  Synthroid 175 Mcg Tabs  (Levothyroxine sodium) .Marland Kitchen.. 1 tab in am 16)  Benzonatate 100 Mg Caps (Benzonatate) .Marland Kitchen.. 1-2 caps three times a day as needed  for cough 17)  Azithromycin 250 Mg Tabs (Azithromycin) .... Take 2 today, then 1 daily x 4 days  Patient Instructions: 1)  Please schedule a follow-up appointment as needed. 2)  Get plenty of rest, drink lots of clear liquids, and use Tylenol or Ibuprofen for fever and comfort. Return in 7-10 days if you're not better:sooner if you're feeling worse. Prescriptions: AZITHROMYCIN 250 MG TABS (AZITHROMYCIN) take 2 today, then 1 daily x 4 days  #6 tabs x 0   Entered and Authorized by:   Esperanza Sheets PA   Signed by:   Esperanza Sheets PA on 02/18/2010   Method used:   Electronically to  [facility] (retail)       [address]       [place], [place]  [address]       Ph: [phone]       Fax: [phone]   RxID:   [id number] BENZONATATE 100 MG CAPS (BENZONATATE) 1-2 caps three times a day as needed  for cough  #30 x 0   Entered and Authorized by:   Esperanza Sheets PA   Signed by:   Esperanza Sheets PA on 02/18/2010   Method used:   Electronically to        [facility] (retail)       [address]       [place], [place]  [address]       Ph: [phone]       Fax: [phone]   RxID:   [id number]   Appended Document: sick- room 2   Medication Administration  Injection # 1:    Medication: Depo- Medrol 80mg     Diagnosis: BACK PAIN, CHRONIC (ICD-724.5)    Route: IM    Site: RUOQ gluteus    Exp Date: 11/11    Lot #: [id number]    Mfr: Pharmacia    Patient tolerated injection without complications    Given by: Adella Hare LPN (February 18, 2010 11:59 AM)  Injection # 2:    Medication: Ketorolac-Toradol 15mg     Diagnosis: BACK PAIN, CHRONIC (ICD-724.5)    Route: IM    Site: LUOQ gluteus    Exp Date: 07/29/2011    Lot #: [id number]    Mfr: [email or web address]    Comments: toradol 60mg   given    Patient tolerated injection without complications    Given by: Adella Hare LPN (February 18, 2010 11:59 AM)  Orders Added: 1)  Depo- Medrol 80mg  [J1040] 2)  Ketorolac-Toradol 15mg  [J1885] 3)  Admin of Therapeutic Inj  intramuscular or subcutaneous [20254]

## 2011-01-27 NOTE — Progress Notes (Signed)
Summary: injection  Phone Note Call from Patient   Summary of Call: needs to get doc to call in levonox injestions. she is leaving next wek going out of town. [phone] Initial call taken by: Rudene Anda,  April 08, 2010 4:32 PM  Follow-up for Phone Call        pls call and ask dr Dietrich [name] to speak with me on wednesday about [name], I have a question about lovenox which I am faxing to him, pls get me tothe phone when he calls Follow-up by: Barbara Overman MD,  April 08, 2010 5:09 PM  Additional Follow-up for Phone Call Additional follow up Details #1::        called [name] office and left message for him to call [name] on susabn Lax.  Additional Follow-up by: Rudene Anda,  April 09, 2010 9:20 AM    Additional Follow-up for Phone Call Additional follow up Details #2::    let pt know I spoke with [name] he recommends walking frrequently, and not lovenox. He further suggests that she speak with the doc who recommended it,see his note aklso when discussing with her, I am happy to speak with her also Follow-up by: Barbara Overman MD,  April 10, 2010 8:15 AM  Additional Follow-up for Phone Call Additional follow up Details #3:: Details for Additional Follow-up Action Taken: Called patient and left message on machine for patient to call.  Barbara Balsam RN BSN  April 11, 2010 5:29 PM  Spoke with patient.  She says that MD that installed [place] filter told her that if she ever flew, she should take prophylactic Lovenox to help prevent clots.  D/W Dr Graciela [name], okay per him.  Pt weighs 233 pounds, per Dr Graciela [name], Lovenox dose should be 1mg /kg. RX sent to [facility] for Lovenox 100mg  subcutaneously two times a day. Barbara Balsam RN BSN  April 15, 2010 2:21 PM   New/Updated Medications: LOVENOX 100 MG/ML SOLN (ENOXAPARIN SODIUM) Use the night before and morning of flights.  I think you need to speak with her because she said that Graciela [name] and [place] told her that she  definitely needed the Lovenox before the plane ride and then before she gets on it to come back home. She said she is gonna call Graciela [name] but if she can't get it then she just won't go on her trip.  Prescriptions: LOVENOX 100 MG/ML SOLN (ENOXAPARIN SODIUM) Use the night before and morning of flights.  #4 x 0   Entered by:   Optometrist BSN   Authorized by:   Nathen May, MD, [facility]   Signed by:   Barbara Balsam RN BSN on 04/15/2010   Method used:   Tax adviser to        [facility] (retail)       [address]       [place], [place]  [address]       Ph: [phone]       Fax: [phone]   RxID:   [id number]  msg left for pt to call, mesg from dr Graciela [name] suggests lovenox at 1mg /kg prior to flight if she states she was told this, she is 105 kg, he has offered further assistance , I will respectfully ask that he does this

## 2011-01-27 NOTE — Assessment & Plan Note (Signed)
Summary: f up   Vital Signs:  Patient profile:   50 year old female Menstrual status:  hysterectomy Height:      63 inches Weight:      236 pounds BMI:     41.96 O2 Sat:      95 % on Room air Pulse rate:   111 / minute Pulse rhythm:   regular Resp:     16 per minute BP sitting:   118 / 80  (left arm) Cuff size:   large  Vitals Entered By: Everitt Amber LPN (April 01, 1609 10:29 AM)  Nutrition Counseling: Patient's BMI is greater than 25 and therefore counseled on weight management options.  O2 Flow:  Room air CC: still having lower back pain, thinks its from where she fell in august, pain is shooting down her right leg from the knee down, and her right arm is being affected also Pain Assessment Patient in pain? yes     Location: back Intensity: 10 Type: aching Onset of pain  sharp when she bends over, burns and throbs   Primary Care Provider:  Syliva Overman MD  CC:  still having lower back pain, thinks its from where she fell in august, pain is shooting down her right leg from the knee down, and and her right arm is being affected also.  History of Present Illness: Pt continues to have uncontrolled back pain, radiates to both knees and down to soles. She has continued swelling, with weakness and nubmness of theright upper ext, drops objects for more than 1 year and this has worsened significantly. Wants  to lower the dosage of cymbalta , thinks its causing alot of weight gain.A call to the pharmacisit negated thi opinion. Pt reports cramping pain wrapping around from lUQ to her back when she sneezes, and also in her leg when she walks  Plans to rtravel, has [place]  filter , and has been told she needs lovenox for flights and has one upcoming wants clarification on this and a scriptto be written.    Current Medications (verified): 1)  Cymbalta 60 Mg Cpep (Duloxetine Hcl) .... 2 Tabs Daily 2)  Miralax  Powd (Polyethylene Glycol 3350) .Marland KitchenMarland KitchenMarland Kitchen 17 Grams Two Times A  Day 3)  Docusate Sodium 100 Mg Tabs (Docusate Sodium) .... Take 1 Tablet By Mouth Two Times A Day 4)  Nexium 40 Mg Cpdr (Esomeprazole Magnesium) .... Take 1 Tablet By Mouth Once Daily 5)  Vitamin D (Ergocalciferol) 50000 Unit Caps (Ergocalciferol) .... One Cap Weekly 6)  Biotin 5000 5 Mg Caps (Biotin) .... Take 1 Tablet By Mouth Two Times A Day 7)  Alprazolam 1 Mg Tabs (Alprazolam) .... Take 1 Tablet By Mouth Four Times A Day As Needed 8)  Verelan 120 Mg Xr24h-Cap (Verapamil Hcl) .Marland Kitchen.. 1 Tab By Mouth Two Times A Day 9)  Topamax 50 Mg Tabs (Topiramate) .... As Needed 10)  Promethazine Hcl 25 Mg Tabs (Promethazine Hcl) .... As Needed 11)  Lovastatin 40 Mg Tabs (Lovastatin) .... One Tab By Mouth Qhs 12)  Spironolactone 25 Mg Tabs (Spironolactone) .... One Tab By Mouth Bid 13)  Cyclobenzaprine Hcl 10 Mg Tabs (Cyclobenzaprine Hcl) .... One Tab By Mouth Tid 14)  Nitrostat 0.4 Mg Subl (Nitroglycerin) .... Uad 15)  Synthroid 175 Mcg Tabs (Levothyroxine Sodium) .Marland Kitchen.. 1 Tab in Am 16)  Benzonatate 100 Mg Caps (Benzonatate) .Marland Kitchen.. 1-2 Caps Three Times A Day As Needed  For Cough 17)  Azithromycin 250 Mg Tabs (Azithromycin) .... Take 2  Today, Then 1 Daily X 4 Days 18)  Tramadol Hcl 50 Mg Tabs (Tramadol Hcl) .... Take 1-2 Every 6 Hrs As Needed For Pain  Allergies (verified): 1)  ! Penicillin 2)  ! Keflex 3)  ! Macrodantin 4)  ! Codeine 5)  ! Sulfa 6)  ! Cleocin 7)  ! Tamiflu (Oseltamivir Phosphate) 8)  ! Dilaudid (Hydromorphone Hcl)  Review of Systems      See HPI General:  Complains of fatigue, malaise, and weight loss; denies chills and fever. Eyes:  Denies blurring and discharge. ENT:  Denies earache, hoarseness, nasal congestion, and sinus pressure. CV:  Complains of fainting, fatigue, shortness of breath with exertion, swelling of feet, and swelling of hands; denies chest pain or discomfort and palpitations. Resp:  Complains of pleuritic, shortness of breath, and wheezing; denies cough and sputum  productive. GI:  Complains of abdominal pain; denies constipation, diarrhea, nausea, and vomiting. GU:  Denies dysuria and urinary frequency. MS:  Complains of joint pain, low back pain, mid back pain, and stiffness. Derm:  Denies itching and rash. Neuro:  Denies falling down, headaches, poor balance, seizures, and sensation of room spinning. Psych:  Complains of anxiety; denies depression, mental problems, panic attacks, suicidal thoughts/plans, and thoughts of violence. Endo:  Complains of weight change. Heme:  Complains of abnormal bruising; denies bleeding. Allergy:  Complains of seasonal allergies.  Physical Exam  General:  Well-developedobese,in no acute distress; alert,appropriate and cooperative throughout examination HEENT: No facial asymmetry,  EOMI, No sinus tenderness, TM's Clear, oropharynx  pink and moist.   Chest: Clear to auscultation bilaterally.  CVS: S1, S2, No murmurs, No S3.   Abd: Soft, Nontender.  MS: decreased  ROM spine,adequate in  hips, shoulders and knees.  GGY:IRSWN of right upper extremity CNS: CN 2-12 intact,  tone and sensation normal throughout. Reduced powr in grip of right hand  Skin: Intact, no visible lesions or rashes.  Psych: Good eye contact, normal affect.  Memory intact, not anxious or depressed appearing.    Impression & Recommendations:  Problem # 1:  ARM NUMBNESS (ICD-782.0) Assessment Comment Only  Orders: Neurology Referral (Neuro)  Problem # 2:  SWELLING OF LIMB (ICD-729.81) Assessment: Comment Only vascular study ordered, negative for DVt , positive for superficial thrombophlebitis, anti-inflammatries and warm compresses , if worsens will refer to vascular surgeon  Problem # 3:  OBESITY (ICD-278.00) Assessment: Deteriorated  Ht: 63 (04/01/2010)   Wt: 236 (04/01/2010)   BMI: 41.96 (04/01/2010)  Problem # 4:  HYPERLIPIDEMIA (ICD-272.4) Assessment: Comment Only  The following medications were removed from the medication  list:    Lovastatin 40 Mg Tabs (Lovastatin) ..... One tab by mouth qhs Her updated medication list for this problem includes:    Lovastatin 40 Mg Tabs (Lovastatin) .Marland Kitchen..Marland Kitchen Two tablets at bedtime  Orders: T-Lipid Profile [id number]) T- Hemoglobin A1C [phone])  Labs Reviewed: SGOT: 16 (12/26/2009)   SGPT: 11 (12/26/2009)   HDL:54 (12/26/2009), 54 (12/26/2009)  LDL:117 (12/26/2009), 117 (12/26/2009)  Chol:214 (12/26/2009), 214 (12/26/2009)  Trig:213 (12/26/2009), 213 (12/26/2009)  Problem # 5:  HYPOTHYROIDISM (ICD-244.9) Assessment: Comment Only  The following medications were removed from the medication list:    Synthroid 175 Mcg Tabs (Levothyroxine sodium) .Marland Kitchen... 1 tab in am Her updated medication list for this problem includes:    Synthroid 100 Mcg Tabs (Levothyroxine sodium) ..... One tablet mon, wed , friday and sunday, one and a half tablet tuesday, thursday and saturday  Labs Reviewed: TSH: 0.022 (12/26/2009)  HgBA1c: 5.7 (06/12/2009) Chol: 214 (12/26/2009)   HDL: 54 (12/26/2009)   LDL: 117 (12/26/2009)   TG: 213 (12/26/2009)  Problem # 6:  BACK PAIN, CHRONIC (ICD-724.5)  Her updated medication list for this problem includes:    Cyclobenzaprine Hcl 10 Mg Tabs (Cyclobenzaprine hcl) ..... One tab by mouth tid    Tramadol Hcl 50 Mg Tabs (Tramadol hcl) .Marland Kitchen... Take 1-2 every 6 hrs as needed for pain  Orders: Ketorolac-Toradol 15mg  (V7846) Admin of Therapeutic Inj  intramuscular or subcutaneous (96295) Oncology Referral (Oncology)Future Orders: Radiology Referral (Radiology) ... 04/02/2010, recent study suggests the possibility of metastatic disease, attempts will be made for pt to see her oncologist for review of the study asap  Complete Medication List: 1)  Cymbalta 60 Mg Cpep (Duloxetine hcl) .... 2 tabs daily 2)  Miralax Powd (Polyethylene glycol 3350) .Marland KitchenMarland KitchenMarland Kitchen 17 grams two times a day 3)  Docusate Sodium 100 Mg Tabs (Docusate sodium) .... Take 1 tablet by mouth two times  a day 4)  Nexium 40 Mg Cpdr (Esomeprazole magnesium) .... Take 1 tablet by mouth once daily 5)  Vitamin D (ergocalciferol) 50000 Unit Caps (Ergocalciferol) .... One cap weekly 6)  Biotin 5000 5 Mg Caps (Biotin) .... Take 1 tablet by mouth two times a day 7)  Alprazolam 1 Mg Tabs (Alprazolam) .... Take 1 tablet by mouth four times a day as needed 8)  Verelan 120 Mg Xr24h-cap (Verapamil hcl) .Marland Kitchen.. 1 tab by mouth two times a day 9)  Topamax 50 Mg Tabs (Topiramate) .... As needed 10)  Promethazine Hcl 25 Mg Tabs (Promethazine hcl) .... As needed 11)  Spironolactone 25 Mg Tabs (Spironolactone) .... One tab by mouth bid 12)  Cyclobenzaprine Hcl 10 Mg Tabs (Cyclobenzaprine hcl) .... One tab by mouth tid 13)  Nitrostat 0.4 Mg Subl (Nitroglycerin) .... Uad 14)  Benzonatate 100 Mg Caps (Benzonatate) .Marland Kitchen.. 1-2 caps three times a day as needed  for cough 15)  Azithromycin 250 Mg Tabs (Azithromycin) .... Take 2 today, then 1 daily x 4 days 16)  Tramadol Hcl 50 Mg Tabs (Tramadol hcl) .... Take 1-2 every 6 hrs as needed for pain 17)  Synthroid 100 Mcg Tabs (Levothyroxine sodium) .... One tablet mon, wed , friday and sunday, one and a half tablet tuesday, thursday and saturday 18)  Lovastatin 40 Mg Tabs (Lovastatin) .... Two tablets at bedtime  Other Orders: T-Basic Metabolic Panel ([id number]) T-Hepatic Function (80076-22960) T-TSH (84443-23280) T-Vitamin D (25-Hydroxy) (82306-85810) T-Calcium  (82310-83160) T-Magnesium (83735-23200)  Patient Instructions: 1)  Please schedule a follow-up appointment in 1 month. 2)  [facility]iest available we willl fax the mRI rept of your lumbar spine over. 3)  I will f/u on the swellng of your arm and weakness  4)  Labs today. 5)  I will call back on lovenox, 6)  Cymbalta does not cause weight gain Prescriptions: LOVASTATIN 40 MG TABS (LOVASTATIN) two tablets at bedtime  #60 x 4   Entered and Authorized by:   Margaret  Simpson MD   Signed by:   Margaret Simpson MD on 04/06/2010   Method used:   Electronically to        [facility] (retail)       [address]       [place], [place]  [address]       Ph: [phone]       Fax: [phone]   RxID:  [id number] SYNTHROID 100 MCG TABS (LEVOTHYROXINE SODIUM) one tablet Mon, Wed , friday and Sunday, one and a half tablet Tuesday, Thursday and Saturday  #40 x 4   Entered and Authorized by:   Margaret Simpson MD   Signed by:   Margaret Simpson MD on 04/06/2010   Method used:   Electronically to        [facility] (retail)       [address]       [place], [place]  [address]       Ph: [phone]       Fax: [phone]   RxID:   [id number]    Medication Administration  Injection # 1:    Medication: Ketorolac-Toradol 15mg     Diagnosis: BACK PAIN, CHRONIC (ICD-724.5)    Route: IM    Site: RUOQ gluteus    Exp Date: 07/2011    Lot #: [id number]    Mfr: novaplus    Comments: 60mg  given     Patient tolerated injection without complications    Given by: Everitt Amber LPN (April 01, 2950 11:43 AM)  Orders Added: 1)  Est. Patient Level IV [88416] 2)  T-Basic Metabolic Panel [60630-16010] 3)  T-Hepatic Function [80076-22960] 4)  T-Lipid Profile [80061-22930] 5)  T- Hemoglobin A1C [83036-23375] 6)  T-TSH [93235-57322] 7)  T-Vitamin D (25-Hydroxy) [02542-70623] 8)  T-Calcium  [76283-15176] 9)  T-Magnesium [16073-71062] 10)  Ketorolac-Toradol 15mg  [J1885] 11)  Admin of Therapeutic Inj  intramuscular or subcutaneous [96372] 12)  Oncology Referral [Oncology] 13)  Radiology Referral [Radiology] 14)  Radiology Referral [Radiology] 15)  Neurology Referral [Neuro]

## 2011-01-27 NOTE — Progress Notes (Signed)
Summary: DR. Suszanne Conners  DR. TEOH   Imported By: Lind Guest 06/24/2010 13:26:00  _____________________________________________________________________  External Attachment:    Type:   Image     Comment:   External Document

## 2011-01-27 NOTE — Letter (Signed)
Summary: [facility]   Imported By: Lind Guest 10/03/2010 13:23:51  _____________________________________________________________________  External Attachment:    Type:   Image     Comment:   External Document

## 2011-01-27 NOTE — Progress Notes (Signed)
Summary: follow up /unc   follow up /unc   Imported By: Carlisle Beers Bullins 07/09/2010 11:42:04  _____________________________________________________________________  External Attachment:    Type:   Image     Comment:   External Document

## 2011-01-27 NOTE — Progress Notes (Signed)
Summary: NEW RX  Phone Note Call from Patient   Summary of Call: SHE WANTS A NEW RX CALLED IN AND DR SAID THAT SHE WOULD DO IT CALL HER AT [phone] I COULD NOT UNDERSTAND WHAT SHE SAID ON THE MACHINE Initial call taken by: Lind Guest,  September 30, 2010 10:01 AM  Follow-up for Phone Call        She is ready for the appetite suppressant you discusses before Ionamin 30mg  (not the phentermine) Does not want GENERIC. Write Brand name [facility] Follow-up by: Everitt Amber LPN,  September 30, 2010 11:19 AM  Additional Follow-up for Phone Call Additional follow up Details #1::        rx written she can collect med is not in e chart, you can also fax, whatever works best, let herknow Additional Follow-up by: Syliva Overman MD,  October 01, 2010 12:24 PM    Additional Follow-up for Phone Call Additional follow up Details #2::    Patient aware  Follow-up by: Everitt Amber LPN,  October 01, 2010 12:55 PM  New/Updated Medications: * IONAMINE Take 1 tablet by mouth once a day

## 2011-01-27 NOTE — Progress Notes (Signed)
Summary: refills to [facility]  Phone Note Call from Patient   Summary of Call: needs meds sent to [facility]. synthroid flexiril and simvastation and nexium. [phone] Initial call taken by: Rudene Anda,  August 13, 2010 1:29 PM    Prescriptions: SIMVASTATIN 40 MG TABS (SIMVASTATIN) Take 1 tab by mouth at bedtime  #90 x 0   Entered by:   Everitt Amber LPN   Authorized by:   Syliva Overman MD   Signed by:   Everitt Amber LPN on 91/47/8295   Method used:   Faxed to ...       [facility] (mail-order)             , [place]         Ph: [phone]       Fax: [phone]   RxID:   [id number] SYNTHROID 150 MCG TABS (LEVOTHYROXINE SODIUM) One tab mon wed and fri. 1/2 tab on tues, thurs, sat and sun  #90days x 0   Entered by:   Everitt Amber LPN   Authorized by:   Syliva Overman MD   Signed by:   Everitt Amber LPN on 40/34/7425   Method used:   Faxed to ...       [facility] (mail-order)             , [place]         Ph: [phone]       Fax: [phone]   RxID:   [id number] CYCLOBENZAPRINE HCL 10 MG TABS (CYCLOBENZAPRINE HCL) one tab by mouth tid  #270 x 0   Entered by:   Everitt Amber LPN   Authorized by:   Syliva Overman MD   Signed by:   Everitt Amber LPN on 73/22/0254   Method used:   Faxed to ...       [facility] (mail-order)             , [place]         Ph: [phone]       Fax: [phone]   RxID:   [id number] NEXIUM 40 MG CPDR (ESOMEPRAZOLE MAGNESIUM) Take 1 tablet by mouth once daily  #90 x 0   Entered by:   Everitt Amber LPN   Authorized by:   Syliva Overman MD   Signed by:   Everitt Amber LPN on 35/00/9381   Method used:   Faxed to ...       [facility] (mail-order)             , [place]         Ph: [phone]       Fax: [phone]   RxID:   [id number]

## 2011-01-27 NOTE — Letter (Signed)
Summary: [facility]   Imported By: Faythe Ghee 03/17/2010 11:51:02  _____________________________________________________________________  External Attachment:    Type:   Image     Comment:   External Document

## 2011-01-27 NOTE — Assessment & Plan Note (Signed)
Summary: office visit   Vital Signs:  Patient profile:   50 year old female Menstrual status:  hysterectomy Height:      63 inches Weight:      241.75 pounds BMI:     42.98 O2 Sat:      95 % on Room air Pulse rate:   120 / minute Pulse rhythm:   regular Resp:     16 per minute BP sitting:   100 / 60  (left arm)  Vitals Entered By: Adella Hare LPN (September 09, 2010 2:34 PM)  Nutrition Counseling: Patient's BMI is greater than 25 and therefore counseled on weight management options.  O2 Flow:  Room air CC: follow-up visit Is Patient Diabetic? No Pain Assessment Patient in pain? no      Comments did not bring meds to ov   Primary Care Provider:  Syliva Overman MD  CC:  follow-up visit.  History of Present Illness: Upcoming reconstruction of right breast also removal of bowel scar tissue at [facility] Sept 30, pt is excited. Dr Mayford Knife Oncologist, sept 23, for CT scan for revision and to check her right nipple which is getting inverted. States she is losing her hair and is very hoarse concerned about the thyroid has needed dose adjustments often in recent times, will need to see endo based on complaints  as well as her continued weight gain despite diety adjustment and medication management with apetite suppressant.    Allergies (verified): 1)  ! Penicillin 2)  ! Keflex 3)  ! Macrodantin 4)  ! Codeine 5)  ! Sulfa 6)  ! Cleocin 7)  ! Tamiflu (Oseltamivir Phosphate) 8)  ! Dilaudid (Hydromorphone Hcl)  Review of Systems      See HPI General:  Complains of fatigue and malaise. Eyes:  Denies discharge, eye pain, and red eye. ENT:  Denies hoarseness, nasal congestion, and sinus pressure. CV:  Denies chest pain or discomfort, palpitations, and swelling of feet. Resp:  Denies cough and sputum productive. GI:  Denies constipation, dark tarry stools, diarrhea, nausea, and vomiting. GU:  Denies dysuria and urinary frequency. MS:  Complains of joint  pain. Derm:  Denies itching and rash. Neuro:  Denies headaches and seizures. Psych:  Denies anxiety and depression. Endo:  Complains of weight change. Heme:  Denies abnormal bruising and bleeding. Allergy:  Complains of seasonal allergies.  Physical Exam  General:  Well-developed,obese,in no acute distress; alert,appropriate and cooperative throughout examination HEENT: No facial asymmetry,  EOMI, No sinus tenderness, TM's Clear, oropharynx  pink and moist.   Chest: Clear to auscultation bilaterally.  CVS: S1, S2, No murmurs, No S3.   Abd: Soft, Nontender.  MS: Adequate ROM spine, hips, shoulders and knees.  Ext: No edema.   CNS: CN 2-12 intact, power tone and sensation normal throughout.   Skin: Intact, no visible lesions or rashes.  Psych: Good eye contact, normal affect.  Memory intact, not anxious or depressed appearing.    Impression & Recommendations:  Problem # 1:  UNSPECIFIED HYPOTHYROIDISM (ICD-244.9) Assessment Comment Only  The following medications were removed from the medication list:    Synthroid 150 Mcg Tabs (Levothyroxine sodium) ..... One tab mon wed and fri. 1/2 tab on tues, thurs, sat and sun Her updated medication list for this problem includes:    Synthroid 100 Mcg Tabs (Levothyroxine sodium) .Marland Kitchen... Take 1 tablet by mouth once a day  Orders: T-TSH [phone]) Free T3-[facility] [phone]) Free T4-[facility] [phone] Orders: Radiology Referral (Radiology) ... 09/10/2010  Endocrinology Referral (Endocrine) ... 09/10/2010  Problem # 2:  DEPRESSION (ICD-311) Assessment: Improved  Her updated medication list for this problem includes:    Alprazolam 1 Mg Tabs (Alprazolam) .Marland Kitchen... Take 1 tablet by mouth four times a day as needed  Problem # 3:  OBESITY (ICD-278.00) Assessment: Deteriorated  Ht: 63 (09/09/2010)   Wt: 241.75 (09/09/2010)   BMI: 42.98 (09/09/2010) therapeutic lifestyle change discussed and encouragedPt tpo stop apetiute suppressant until  thyroid function is evaluated byu endo.  Problem # 4:  HYPERLIPIDEMIA (ICD-272.4) Assessment: Improved  Her updated medication list for this problem includes:    Pravachol 40 Mg Tabs (Pravastatin sodium) ..... One tab by mouth at bedtime discontinue simvastatin Low fat diet discussed and encouraged,    Labs Reviewed: SGOT: 13 (06/10/2010)   SGPT: 10 (06/10/2010)   HDL:48 (06/10/2010), 53 (04/01/2010)  LDL:81 (06/10/2010), 138 (16/09/9603)  Chol:172 (06/10/2010), 228 (04/01/2010)  Trig:216 (06/10/2010), 185 (04/01/2010)  Complete Medication List: 1)  Miralax Powd (Polyethylene glycol 3350) .Marland KitchenMarland KitchenMarland Kitchen 17 grams two times a day 2)  Docusate Sodium 100 Mg Tabs (Docusate sodium) .... Take 1 tablet by mouth two times a day 3)  Nexium 40 Mg Cpdr (Esomeprazole magnesium) .... Take 1 tablet by mouth once daily 4)  Biotin 5000 5 Mg Caps (Biotin) .... Take 1 tablet by mouth two times a day 5)  Alprazolam 1 Mg Tabs (Alprazolam) .... Take 1 tablet by mouth four times a day as needed 6)  Spironolactone 25 Mg Tabs (Spironolactone) .... One tab by mouth bid 7)  Cyclobenzaprine Hcl 10 Mg Tabs (Cyclobenzaprine hcl) .... One tab by mouth tid 8)  Nitrostat 0.4 Mg Subl (Nitroglycerin) .... Uad 9)  Ciprofloxacin Hcl 500 Mg Tabs (Ciprofloxacin hcl) .... Take 1 tablet by mouth two times a day 10)  Verapamil Hcl 120 Mg Tabs (Verapamil hcl) .... One tab every other day 11)  Pravachol 40 Mg Tabs (Pravastatin sodium) .... One tab by mouth at bedtime discontinue simvastatin 12)  Synthroid 100 Mcg Tabs (Levothyroxine sodium) .... Take 1 tablet by mouth once a day  Other Orders: Influenza Vaccine NON MCR (54098) T-Basic Metabolic Panel [phone]) T- Hemoglobin A1C (62130-86578) T-Hepatic Function [phone]) T-Lipid Profile (13244-01027)  Patient Instructions: 1)  Please schedule a follow-up appointment in 3 months. 2)  It is important that you exercise regularly at least 20 minutes 5 times a week. If you  develop chest pain, have severe difficulty breathing, or feel very tired , stop exercising immediately and seek medical attention. 3)  You need to lose weight. Consider a lower calorie diet and regular exercise.  4)  BMP prior to visit, ICD-9: 5)  TSH prior to visit, ICD-9:  today 6)  HbgA1C prior to visit, ICD-9: 7)  Hepatic Panel prior to visit, ICD-9: 8)  Lipid Panel prior to visit, ICD-9: 9)  You are being referred for a thyroid US also to see a specialist  Prescriptions: SYNTHROID 100 MCG TABS (LEVOTHYROXINE SODIUM) Take 1 tablet by mouth once a day  #30 x 3   Entered and Authorized by:   Syliva Overman MD   Signed by:   Syliva Overman MD on 09/11/2010   Method used:   Historical   RxID:   [id number]    Influenza Vaccine    Vaccine Type: Fluvax Non-MCR    Site: right deltoid    Mfr: novartis    Dose: 0.5 ml    Route: IM    Given by: Adella Hare LPN    Exp.  Date: 04/2011    Lot #: [id number]    VIS given: 07/22/10 version given September 09, 2010.

## 2011-01-27 NOTE — Letter (Signed)
Summary: demographic  demographic   Imported By: Curtis Sites 08/07/2010 11:57:08  _____________________________________________________________________  External Attachment:    Type:   Image     Comment:   External Document

## 2011-01-27 NOTE — Letter (Signed)
Summary: chart 2 misc.  chart 2 misc.   Imported By: Curtis Sites 08/07/2010 11:56:23  _____________________________________________________________________  External Attachment:    Type:   Image     Comment:   External Document

## 2011-01-27 NOTE — Progress Notes (Signed)
Summary: [facility]   Imported By: Lind Guest 07/22/2010 14:11:54  _____________________________________________________________________  External Attachment:    Type:   Image     Comment:   External Document

## 2011-01-27 NOTE — Assessment & Plan Note (Signed)
Summary: new endo medicare hypothy pt--pkg-#--per sally/dr margaret si...   Vital Signs:  Patient profile:   50 year old female Menstrual status:  hysterectomy Height:      63 inches (160.02 cm) Weight:      234.31 pounds (106.50 kg) BMI:     41.66 O2 Sat:      94 % on Room air Temp:     99.1 degrees F (37.28 degrees C) oral Pulse rate:   105 / minute BP sitting:   110 / 80  (left arm) Cuff size:   large  Vitals Entered By: Brenton Grills MA (September 22, 2010 2:25 PM)  O2 Flow:  Room air CC: New Endo/Hypothyroidism/Dr. Lodema Hong   Referring Provider:  Syliva Overman Primary Provider:  Syliva Overman MD  CC:  New Endo/Hypothyroidism/Dr. Lodema Hong.  History of Present Illness: pt was dx'ed with hypothyroidism in 1988, and she has been on synthroid since.  dr Lodema Hong has been reducing synthroid due to persistently low tsh.  most recent reduction was approx 2 weeks ago. symptomatically, pt states ferw mos of severe hair loss on the head, and assoc hoarseness.    Current Medications (verified): 1)  Miralax  Powd (Polyethylene Glycol 3350) .Marland KitchenMarland KitchenMarland Kitchen 17 Grams Two Times A Day 2)  Docusate Sodium 100 Mg Tabs (Docusate Sodium) .... Take 1 Tablet By Mouth Two Times A Day 3)  Nexium 40 Mg Cpdr (Esomeprazole Magnesium) .... Take 1 Tablet By Mouth Once Daily 4)  Biotin 5000 5 Mg Caps (Biotin) .... Take 1 Tablet By Mouth Two Times A Day 5)  Alprazolam 1 Mg Tabs (Alprazolam) .... Take 1 Tablet By Mouth Four Times A Day As Needed 6)  Spironolactone 25 Mg Tabs (Spironolactone) .... One Tab By Mouth Bid 7)  Cyclobenzaprine Hcl 10 Mg Tabs (Cyclobenzaprine Hcl) .... One Tab By Mouth Tid 8)  Nitrostat 0.4 Mg Subl (Nitroglycerin) .... Uad 9)  Ciprofloxacin Hcl 500 Mg Tabs (Ciprofloxacin Hcl) .... Take 1 Tablet By Mouth Two Times A Day 10)  Verapamil Hcl 120 Mg Tabs (Verapamil Hcl) .... One Tab Every Other Day 11)  Pravachol 40 Mg Tabs (Pravastatin Sodium) .... One Tab By Mouth At Bedtime Discontinue  Simvastatin 12)  Synthroid 100 Mcg Tabs (Levothyroxine Sodium) .... Take 1 Tablet By Mouth Once A Day 13)  Vitamin D3 2000 Unit Caps (Cholecalciferol) .Marland Kitchen.. 1 By Mouth Once Daily 14)  Vitamin E 1000 Unit Caps (Vitamin E) .Marland Kitchen.. 1 By Mouth Once Daily 15)  Vitamin C 1000 Mg Tabs (Ascorbic Acid) .Marland Kitchen.. 1 By Mouth Once Daily 16)  Vitamin B-12 1000 Mcg Tabs (Cyanocobalamin) .Marland Kitchen.. 1 By Mouth Once Daily  Allergies: 1)  ! Penicillin 2)  ! Keflex 3)  ! Macrodantin 4)  ! Codeine 5)  ! Sulfa 6)  ! Cleocin 7)  ! Tamiflu (Oseltamivir Phosphate) 8)  ! Dilaudid (Hydromorphone Hcl) 9)  ! Biaxin  Past History:  Past Medical History: Last updated: 06/14/2009 CHEST TIGHTNESS-PRESSURE-OTHER [phone]) DYSPNEA (ICD-786.05) HYPERLIPIDEMIA (ICD-272.4) OBESITY (ICD-278.00) FATIGUE, CHRONIC (ICD-780.79) NAUSEA WITH VOMITING (ICD-787.01) CYSTITIS (ICD-595.9) ANEMIA, IRON DEFICIENCY, CHRONIC (ICD-280.9) FIBROMYALGIA (ICD-729.1) ANXIETY, CHRONIC (ICD-300.00) HYPOTHYROIDISM (ICD-244.9) ADENOCARCINOMA, BREAST, LEFT (ICD-174.9) ABDOMINAL PAIN, GENERALIZED (ICD-789.07) KNEE PAIN, BILATERAL (ICD-719.46) NECK PAIN, CHRONIC (ICD-723.1) BACK PAIN, CHRONIC (ICD-724.5) SMALL BOWEL OBSTRUCTION, HX OF (ICD-V12.79) SUPRAVENTRICULAR TACHYCARDIA, HX OF (ICD-V12.59) ABDOMINAL PAIN, HX OF (ICD-V15.89) Hx of VAGINITIS (ICD-616.10)  Family History: Reviewed history from 03/16/2008 and no changes required. ONE CHILD MOTHER  60 DM FATHER  60 MAC DEG ONE BROTHER STILLBORN neither parent  is overweight  Social History: Reviewed history from 03/16/2008 and no changes required. DISABLED Married Never Smoked Alcohol use-no Drug use-no  Review of Systems       she reports dry skin, fatigue, muscle cramps, myalgias, rhinorrhea, and brittle nails. denies depression, sob, memory loss, constipation, menopausal sxs, blurry vision, easy bruising, and myalgias.  no syncope x 1 year. she lost 45 lbs during  hospitalization in 2010.  she attributes numbness of hands to carpal tunnel tunnel syndrome.    Physical Exam  General:  morbidly obese.  no distress  Head:  head: no deformity eyes: no periorbital swelling, no proptosis external nose and ears are normal mouth: no lesion seen Neck:  thyroid is non-palpable Lungs:  Clear to auscultation bilaterally. Normal respiratory effort.  Heart:  Regular rate and rhythm without murmurs or gallops noted. Normal S1,S2.   Abdomen:  abdomen is soft, nontender.  no hepatosplenomegaly.   not distended.  no hernia there is a healed midline surgical scar Msk:  muscle bulk and strength are grossly normal.  no obvious joint swelling.  gait is normal and steady  Extremities:  no edema no deformity Neurologic:  cn 2-12 grossly intact.   readily moves all 4's.   sensation is intact to touch on the feet  Skin:  normal texture and temp.  no rash.  not diaphoretic  Cervical Nodes:  No significant adenopathy.  Psych:  Alert and cooperative; normal mood and affect; normal attention span and concentration.   Additional Exam:  Leutinizing Hormone       20.82 mIU/mL   Impression & Recommendations:  Problem # 1:  UNSPECIFIED HYPOTHYROIDISM (ICD-244.9) overreplaced.  this is being addressed by dr Lodema Hong.  Problem # 2:  UNSPECIFIED TACHYCARDIA (ICD-785.0) for which she has had ablation x 2, in the 1990's.  it is uncertain how, or if, this is related to #1  Problem # 3:  AMENORRHEA, SECONDARY (ICD-626.0) this LH is not high enough to dx menopause  Medications Added to Medication List This Visit: 1)  Vitamin D3 2000 Unit Caps (Cholecalciferol) .Marland Kitchen.. 1 by mouth once daily 2)  Vitamin E 1000 Unit Caps (Vitamin e) .Marland Kitchen.. 1 by mouth once daily 3)  Vitamin C 1000 Mg Tabs (Ascorbic acid) .Marland Kitchen.. 1 by mouth once daily 4)  Vitamin B-12 1000 Mcg Tabs (Cyanocobalamin) .Marland Kitchen.. 1 by mouth once daily  Other Orders: TLB-Luteinizing Hormone (LH) (83002-LH) Consultation Level IV  (25956)  Patient Instructions: 1)  for best results for your health, especially your heartbeat, you should continue the adjustment of your thyroid medication by dr simpson.  it is known that your health is better when this blood test is normal.   2)  blood tests are being ordered for you today.  please call [phone] to hear your test results.   3)  (update: i left message on phone-tree:  rx as we discussed.  this LH is not high enough to dx menopause).   Preventive Care Screening  Mammogram:    Date:  01/28/2010    Results:  normal

## 2011-01-27 NOTE — Assessment & Plan Note (Signed)
Summary: BACK PAIN- room 1   Vital Signs:  Patient profile:   50 year old female Menstrual status:  hysterectomy Height:      63 inches Weight:      233.75 pounds BMI:     41.56 O2 Sat:      97 % on Room air Pulse rate:   112 / minute Resp:     16 per minute BP sitting:   110 / 80  (left arm)  Vitals Entered By: Adella Hare LPN (March 19, 2010 2:49 PM) CC: back pain, burning with urination Is Patient Diabetic? No Pain Assessment Patient in pain? yes     Location: back Intensity: 9 Type: sharp Onset of pain  Constant   Referring Provider:  Syliva Overman Primary Provider:  Syliva Overman MD  CC:  back pain and burning with urination.  History of Present Illness: Hx of back pain off & on since last Aug.  Started again yesterday.  Worse with certain mvmts. Like a chatch in her back. Hx of radiation down LLE. Now pain bilat LE.  Lt foot numbness off & on. Had abd pain Monday x 1 day.  Called GI to get f/u appt. Had nausea & vomiting x 2 days over wkend. Gone now & appetitie back to nl. Hx of interstitial cystitis.  Had dysuria this am with void.  Did have a MRI 4/09.  Pt is requesting an injection.  States this has helped alot in the past. Has been using heat. Has pain meds & muscle relaxers at home.       Current Medications (verified): 1)  Cymbalta 60 Mg Cpep (Duloxetine Hcl) .... 2 Tabs Daily 2)  Miralax  Powd (Polyethylene Glycol 3350) .Marland KitchenMarland KitchenMarland Kitchen 17 Grams Two Times A Day 3)  Docusate Sodium 100 Mg Tabs (Docusate Sodium) .... Take 1 Tablet By Mouth Two Times A Day 4)  Nexium 40 Mg Cpdr (Esomeprazole Magnesium) .... Take 1 Tablet By Mouth Once Daily 5)  Vitamin D (Ergocalciferol) 50000 Unit Caps (Ergocalciferol) .... One Cap Weekly 6)  Biotin 5000 5 Mg Caps (Biotin) .... Take 1 Tablet By Mouth Two Times A Day 7)  Alprazolam 1 Mg Tabs (Alprazolam) .... Take 1 Tablet By Mouth Four Times A Day As Needed 8)  Verelan 120 Mg Xr24h-Cap (Verapamil Hcl) .Marland Kitchen.. 1 Tab By  Mouth Two Times A Day 9)  Topamax 50 Mg Tabs (Topiramate) .... As Needed 10)  Promethazine Hcl 25 Mg Tabs (Promethazine Hcl) .... As Needed 11)  Lovastatin 40 Mg Tabs (Lovastatin) .... One Tab By Mouth Qhs 12)  Spironolactone 25 Mg Tabs (Spironolactone) .... One Tab By Mouth Bid 13)  Cyclobenzaprine Hcl 10 Mg Tabs (Cyclobenzaprine Hcl) .... One Tab By Mouth Tid 14)  Nitrostat 0.4 Mg Subl (Nitroglycerin) .... Uad 15)  Synthroid 175 Mcg Tabs (Levothyroxine Sodium) .Marland Kitchen.. 1 Tab in Am 16)  Benzonatate 100 Mg Caps (Benzonatate) .Marland Kitchen.. 1-2 Caps Three Times A Day As Needed  For Cough 17)  Azithromycin 250 Mg Tabs (Azithromycin) .... Take 2 Today, Then 1 Daily X 4 Days  Allergies (verified): 1)  ! Penicillin 2)  ! Keflex 3)  ! Macrodantin 4)  ! Codeine 5)  ! Sulfa 6)  ! Cleocin 7)  ! Tamiflu (Oseltamivir Phosphate) 8)  ! Dilaudid (Hydromorphone Hcl)  Physical Exam  General:  Well-developed,well-nourished,in no acute distress; alert,appropriate and cooperative throughout examination Head:  Normocephalic and atraumatic without obvious abnormalities. No apparent alopecia or balding. Lungs:  Normal respiratory effort, chest expands  symmetrically. Lungs are clear to auscultation, no crackles or wheezes. Heart:  Normal rate and regular rhythm. S1 and S2 normal without gallop, murmur, click, rub or other extra sounds. Abdomen:  Bowel sounds positive,abdomen soft and non-tender without masses, organomegaly or hernias noted. Msk:  LS Spine- pt changes positions slowly.  TTP lateral to Rt SI & at Rt SI.  Nontender spinous processes, paraspinal musculature & sciatic notch.  Neg SLR  Extremities:  No clubbing, cyanosis, edema, or deformity noted with normal full range of motion of all joints.   Neurologic:  alert & oriented X3, gait normal, and DTRs symmetrical and normal.   Skin:  Intact without suspicious lesions or rashes Psych:  Cognition and judgment appear intact. Alert and cooperative with normal  attention span and concentration. No apparent delusions, illusions, hallucinations   Impression & Recommendations:  Problem # 1:  BACK PAIN, CHRONIC (ICD-724.5) Assessment Deteriorated  Her updated medication list for this problem includes:    Cyclobenzaprine Hcl 10 Mg Tabs (Cyclobenzaprine hcl) ..... One tab by mouth tid    Tramadol Hcl 50 Mg Tabs (Tramadol hcl) .Marland Kitchen... Take 1-2 every 6 hrs as needed for pain  Orders: Ketorolac-Toradol 15mg  (U0454) Misc. Referral (Misc. Ref) Admin of Therapeutic Inj  intramuscular or subcutaneous (09811)  Problem # 2:  ABDOMINAL PAIN, GENERALIZED (ICD-789.07) Assessment: Comment Only Pt is following up with GI.  Complete Medication List: 1)  Cymbalta 60 Mg Cpep (Duloxetine hcl) .... 2 tabs daily 2)  Miralax Powd (Polyethylene glycol 3350) .Marland KitchenMarland KitchenMarland Kitchen 17 grams two times a day 3)  Docusate Sodium 100 Mg Tabs (Docusate sodium) .... Take 1 tablet by mouth two times a day 4)  Nexium 40 Mg Cpdr (Esomeprazole magnesium) .... Take 1 tablet by mouth once daily 5)  Vitamin D (ergocalciferol) 50000 Unit Caps (Ergocalciferol) .... One cap weekly 6)  Biotin 5000 5 Mg Caps (Biotin) .... Take 1 tablet by mouth two times a day 7)  Alprazolam 1 Mg Tabs (Alprazolam) .... Take 1 tablet by mouth four times a day as needed 8)  Verelan 120 Mg Xr24h-cap (Verapamil hcl) .Marland Kitchen.. 1 tab by mouth two times a day 9)  Topamax 50 Mg Tabs (Topiramate) .... As needed 10)  Promethazine Hcl 25 Mg Tabs (Promethazine hcl) .... As needed 11)  Lovastatin 40 Mg Tabs (Lovastatin) .... One tab by mouth qhs 12)  Spironolactone 25 Mg Tabs (Spironolactone) .... One tab by mouth bid 13)  Cyclobenzaprine Hcl 10 Mg Tabs (Cyclobenzaprine hcl) .... One tab by mouth tid 14)  Nitrostat 0.4 Mg Subl (Nitroglycerin) .... Uad 15)  Synthroid 175 Mcg Tabs (Levothyroxine sodium) .Marland Kitchen.. 1 tab in am 16)  Benzonatate 100 Mg Caps (Benzonatate) .Marland Kitchen.. 1-2 caps three times a day as needed  for cough 17)  Azithromycin 250 Mg  Tabs (Azithromycin) .... Take 2 today, then 1 daily x 4 days 18)  Tramadol Hcl 50 Mg Tabs (Tramadol hcl) .... Take 1-2 every 6 hrs as needed for pain  Other Orders: UA Dipstick W/ Micro (manual) (91478)  Patient Instructions: 1)  Keep your next appt. 2)  I have ordered an MRI of you lower back. 3)  I have prescribed Tramadol to use as needed for pain. 4)  You have received Toradol today for pain & inflammation. Prescriptions: TRAMADOL HCL 50 MG TABS (TRAMADOL HCL) take 1-2 every 6 hrs as needed for pain  #30 x 0   Entered and Authorized by:   Esperanza Sheets PA   Signed by:  Esperanza Sheets PA on 03/19/2010   Method used:   Electronically to        [facility] (retail)       [address]       [place], [place]  [address]       Ph: [phone]       Fax: [phone]   RxID:   [id number]   Laboratory Results   Urine Tests  Date/Time Received: March 19, 2010  Date/Time Reported: March 19, 2010   Routine Urinalysis   Color: yellow Appearance: Clear Glucose: negative   (Normal Range: Negative) Bilirubin: small   (Normal Range: Negative) Ketone: trace (5)   (Normal Range: Negative) Spec. Gravity: >=1.030   (Normal Range: 1.003-1.035) Blood: trace-intact   (Normal Range: Negative) pH: 6.0   (Normal Range: 5.0-8.0) Protein: trace   (Normal Range: Negative) Urobilinogen: 0.2   (Normal Range: 0-1) Nitrite: negative   (Normal Range: Negative) Leukocyte Esterace: negative   (Normal Range: Negative)         Medication Administration  Injection # 1:    Medication: Ketorolac-Toradol 15mg     Diagnosis: BACK PAIN, CHRONIC (ICD-724.5)    Route: IM    Site: RUOQ gluteus    Exp Date: 07/29/2011    Lot #: [id number]    Mfr: novaplus    Comments: toradol 60mg  given    Patient tolerated injection without complications    Given by: Adella Hare LPN (March 19, 2010 3:55 PM)  Orders Added: 1)  UA Dipstick W/ Micro (manual) [81000] 2)   Ketorolac-Toradol 15mg  [J1885] 3)  Misc. Referral [Misc. Ref] 4)  Admin of Therapeutic Inj  intramuscular or subcutaneous [96372] 5)  Est. Patient Level IV [01027]

## 2011-01-27 NOTE — Progress Notes (Signed)
  Phone Note From Pharmacy   Caller: [facility] Summary of Call: patient requesting phentermine 37.5mg  instead of 30mg  Initial call taken by: Adella Hare LPN,  July 05, 5283 3:53 PM  Follow-up for Phone Call        I have typed it as historic, pls print , write dose cjhange and stamp and fax to pharmacy and let pt know.She also needs ov in the next  4 weks before next refill Follow-up by: Syliva Overman MD,  July 06, 2010 7:28 PM  Additional Follow-up for Phone Call Additional follow up Details #1::        called patient, left message med sent Additional Follow-up by: Adella Hare LPN,  July 07, 2010 11:26 AM    New/Updated Medications: PHENTERMINE HCL 37.5 MG TABS (PHENTERMINE HCL) Take 1 tablet by mouth once a day Prescriptions: PHENTERMINE HCL 37.5 MG TABS (PHENTERMINE HCL) Take 1 tablet by mouth once a day  #30 x 0   Entered and Authorized by:   Syliva Overman MD   Signed by:   Syliva Overman MD on 07/06/2010   Method used:   Historical   RxID:   [id number]

## 2011-01-27 NOTE — Progress Notes (Signed)
Summary: CANCELLED THE APPT  Phone Note Call from Patient   Summary of Call: CALLED [name] TOLD HER TO FORGET THIS APPOINMENT AND CALLED DR. Renelda Mom OFFICE AND LEFT A MESSAGE FOR THEM TO CANCEL THE APPOINMENT Initial call taken by: Lind Guest,  April 02, 2010 4:16 PM  Follow-up for Phone Call        noted and agree Follow-up by: Syliva Overman MD,  April 03, 2010 5:04 AM

## 2011-01-27 NOTE — Letter (Signed)
Summary: progress notes  progress notes   Imported By: Curtis Sites 08/07/2010 11:58:16  _____________________________________________________________________  External Attachment:    Type:   Image     Comment:   External Document

## 2011-01-29 NOTE — Progress Notes (Signed)
Summary: [facility]  Phone Note Call from Patient   Summary of Call: LEFT MESSAGE NEEDS HER PAVACHOL, LEVOTHYROXINE AND HER FLEXERIL SENT TO [facility]  Initial call taken by: Lind Guest,  January 20, 2011 4:16 PM    Prescriptions: CYCLOBENZAPRINE HCL 10 MG TABS (CYCLOBENZAPRINE HCL) one tab by mouth tid  #270 x 0   Entered by:   Adella Hare LPN   Authorized by:   Syliva Overman MD   Signed by:   Adella Hare LPN on 81/19/1478   Method used:   Faxed to ...       [facility] (mail-order)             , [place]         Ph: [phone]       Fax: [phone]   RxID:   [id number] SYNTHROID 100 MCG TABS (LEVOTHYROXINE SODIUM) Take 1 tablet by mouth once a day  #90 x 0   Entered by:   Adella Hare LPN   Authorized by:   Syliva Overman MD   Signed by:   Adella Hare LPN on 66/44/0347   Method used:   Faxed to ...       [facility] (mail-order)             , [place]         Ph: [phone]       Fax: [phone]   RxID:   [id number] PRAVACHOL 40 MG TABS (PRAVASTATIN SODIUM) one tab by mouth at bedtime discontinue simvastatin  #90 x 0   Entered by:   Adella Hare LPN   Authorized by:   Syliva Overman MD   Signed by:   Adella Hare LPN on 35/57/3220   Method used:   Faxed to ...       [facility] (mail-order)             , [place]         Ph: [phone]       Fax: [phone]   RxID:   [id number]

## 2011-01-30 ENCOUNTER — Encounter: Payer: Self-pay | Admitting: Family Medicine

## 2011-02-10 ENCOUNTER — Ambulatory Visit (INDEPENDENT_AMBULATORY_CARE_PROVIDER_SITE_OTHER): Payer: Medicare Other | Admitting: Family Medicine

## 2011-02-10 ENCOUNTER — Encounter: Payer: Self-pay | Admitting: Family Medicine

## 2011-02-10 DIAGNOSIS — E669 Obesity, unspecified: Secondary | ICD-10-CM

## 2011-02-10 DIAGNOSIS — E785 Hyperlipidemia, unspecified: Secondary | ICD-10-CM

## 2011-02-10 DIAGNOSIS — M949 Disorder of cartilage, unspecified: Secondary | ICD-10-CM

## 2011-02-10 DIAGNOSIS — N3000 Acute cystitis without hematuria: Secondary | ICD-10-CM

## 2011-02-10 DIAGNOSIS — E039 Hypothyroidism, unspecified: Secondary | ICD-10-CM

## 2011-02-10 DIAGNOSIS — IMO0001 Reserved for inherently not codable concepts without codable children: Secondary | ICD-10-CM

## 2011-02-10 DIAGNOSIS — E8881 Metabolic syndrome: Secondary | ICD-10-CM

## 2011-02-10 DIAGNOSIS — M899 Disorder of bone, unspecified: Secondary | ICD-10-CM | POA: Insufficient documentation

## 2011-02-10 HISTORY — DX: Metabolic syndrome: E88.81

## 2011-02-10 HISTORY — DX: Metabolic syndrome: E88.810

## 2011-02-10 LAB — CONVERTED CEMR LAB
Blood in Urine, dipstick: NEGATIVE
Glucose, Urine, Semiquant: NEGATIVE
Nitrite: NEGATIVE
Protein, U semiquant: NEGATIVE
Urobilinogen, UA: 0.2
WBC Urine, dipstick: NEGATIVE
pH: 5.5

## 2011-02-11 ENCOUNTER — Encounter: Payer: Self-pay | Admitting: Family Medicine

## 2011-02-12 ENCOUNTER — Telehealth: Payer: Self-pay | Admitting: Family Medicine

## 2011-02-12 LAB — CONVERTED CEMR LAB
ALT: 10 units/L (ref 0–35)
AST: 14 units/L (ref 0–37)
Albumin: 4.7 g/dL (ref 3.5–5.2)
Alkaline Phosphatase: 75 units/L (ref 39–117)
Basophils Absolute: 0 10*3/uL (ref 0.0–0.1)
Basophils Relative: 0 % (ref 0–1)
CO2: 24 meq/L (ref 19–32)
Calcium: 9.2 mg/dL (ref 8.4–10.5)
Chloride: 101 meq/L (ref 96–112)
Creatinine, Ser: 0.9 mg/dL (ref 0.40–1.20)
Eosinophils Relative: 1 % (ref 0–5)
HDL: 48 mg/dL (ref >39)
Lymphocytes Relative: 25 % (ref 12–46)
MCHC: 33.6 g/dL (ref 30.0–36.0)
Neutro Abs: 6.1 10*3/uL (ref 1.7–7.7)
Platelets: 196 10*3/uL (ref 150–400)
RDW: 14.2 % (ref 11.5–15.5)
Sodium: 138 meq/L (ref 135–145)
TSH: 2.082 microintl units/mL (ref 0.350–4.500)
Total Bilirubin: 0.4 mg/dL (ref 0.3–1.2)
Total CHOL/HDL Ratio: 4.1
Total Protein: 7 g/dL (ref 6.0–8.3)
Triglycerides: 224 mg/dL — ABNORMAL HIGH (ref <150)

## 2011-02-12 NOTE — Letter (Signed)
[facility]   Imported By: Lind Guest 02/04/2011 11:17:12  _____________________________________________________________________  External Attachment:    Type:   Image     Comment:   External Document

## 2011-02-17 DIAGNOSIS — N3010 Interstitial cystitis (chronic) without hematuria: Secondary | ICD-10-CM | POA: Insufficient documentation

## 2011-02-17 HISTORY — DX: Interstitial cystitis (chronic) without hematuria: N30.10

## 2011-02-18 NOTE — Progress Notes (Signed)
Summary: DrHopkins office / Debbie needs more information   Phone Note Call from Patient   Summary of Call: she done a c a-1 ovarian cancer test and said that is is elvated and something about a biopsy. She spoke with a woman named Debbie at Dr. Abbe Amsterdam office and did not tell her the marker level that iy is at. Wants to know will you call the lady Debbie at [phone] and find out her information she does not understand.Please call her back with the information. Initial call taken by: Lind Guest,  February 12, 2011 12:50 PM  Follow-up for Phone Call        Chalyn will have to sign a release form, the lady will not give me info since I did not refer her.. She should sched an appt with the doc who ordered the test asap to explain the result, pls explain to her Follow-up by: Syliva Overman MD,  February 12, 2011 5:53 PM  Additional Follow-up for Phone Call Additional follow up Details #1::        called patient left message  Additional Follow-up by: Everitt Amber LPN,  February 13, 2011 8:02 AM    Additional Follow-up for Phone Call Additional follow up Details #2::    Quenesha called and asked herself for the results and they gave them to her.  Dec 2010 -CA-125 was 12.29 Jan 2011-CA-125 was 60.3  They have made her a referral to specialist asap and she also wants you to know that the mass was 4.2cm and now its 7cm  Follow-up by: Everitt Amber LPN,  February 13, 2011 11:05 AM  Additional Follow-up for Phone Call Additional follow up Details #3:: Details for Additional Follow-up Action Taken: noted , I left a message of support Additional Follow-up by: Syliva Overman MD,  February 13, 2011 12:17 PM

## 2011-02-18 NOTE — Letter (Signed)
Summary: medical release  medical release   Imported By: Lind Guest 02/11/2011 16:08:08  _____________________________________________________________________  External Attachment:    Type:   Image     Comment:   External Document

## 2011-02-19 ENCOUNTER — Encounter: Payer: Self-pay | Admitting: Family Medicine

## 2011-02-24 NOTE — Assessment & Plan Note (Signed)
Summary: fu cancelled Dec visit   Vital Signs:  Patient profile:   50 year old female Menstrual status:  hysterectomy Height:      63 inches Weight:      239 pounds BMI:     42.49 O2 Sat:      94 % Pulse rate:   106 / minute Pulse rhythm:   regular Resp:     16 per minute BP sitting:   130 / 86  (left arm) Cuff size:   large  Vitals Entered By: Everitt Amber LPN (February 10, 2011 2:01 PM)  Nutrition Counseling: Patient's BMI is greater than 25 and therefore counseled on weight management options. CC: Follow up chronic problems   Primary Care Provider:  Syliva Overman MD  CC:  Follow up chronic problems.  History of Present Illness: Reports  that she has been doing fairly well, except for continued weight gain. She has had reconstructive srgery on her abdomen and left breast since her last visit and she is happy with this. There is a report that a pelvic mass which she has had for years has now enlarged and she may need surgery. At this time she states she canface "anything" Denies recent fever or chills. Denies sinus pressure, nasal congestion , ear pain or sore throat. Denies chest congestion, or cough productive of sputum. Denies chest pain, palpitations, PND, orthopnea or leg swelling. Denies abdominal pain, nausea, vomitting, diarrhea or constipation. Denies change in bowel movements or bloody stool.   Denies headaches, vertigo, seizures. Denies depression, anxiety or insomnia. Denies  rash, lesions, or itch.     Current Medications (verified): 1)  Miralax  Powd (Polyethylene Glycol 3350) .Marland KitchenMarland KitchenMarland Kitchen 17 Grams Two Times A Day 2)  Docusate Sodium 100 Mg Tabs (Docusate Sodium) .... Take 1 Tablet By Mouth Two Times A Day 3)  Biotin 5000 5 Mg Caps (Biotin) .... Take 1 Tablet By Mouth Two Times A Day 4)  Alprazolam 1 Mg Tabs (Alprazolam) .... Take 1 Tablet By Mouth Four Times A Day As Needed 5)  Spironolactone 25 Mg Tabs (Spironolactone) .... One Tab By Mouth Bid 6)   Cyclobenzaprine Hcl 10 Mg Tabs (Cyclobenzaprine Hcl) .... One Tab By Mouth Tid 7)  Nitrostat 0.4 Mg Subl (Nitroglycerin) .... Uad 8)  Verapamil Hcl 120 Mg Tabs (Verapamil Hcl) .... One Tab Every Other Day 9)  Pravachol 40 Mg Tabs (Pravastatin Sodium) .... One Tab By Mouth At Bedtime Discontinue Simvastatin 10)  Synthroid 100 Mcg Tabs (Levothyroxine Sodium) .... Take 1 Tablet By Mouth Once A Day 11)  Vitamin D3 2000 Unit Caps (Cholecalciferol) .Marland Kitchen.. 1 By Mouth Once Daily 12)  Vitamin E 1000 Unit Caps (Vitamin E) .Marland Kitchen.. 1 By Mouth Once Daily 13)  Vitamin C 1000 Mg Tabs (Ascorbic Acid) .Marland Kitchen.. 1 By Mouth Once Daily 14)  Vitamin B-12 1000 Mcg Tabs (Cyanocobalamin) .Marland Kitchen.. 1 By Mouth Once Daily 15)  Phentermine Hcl 37.5 Mg Caps (Phentermine Hcl) .... Take 1 Tablet By Mouth Once A Day 16)  Omeprazole 40 Mg Cpdr (Omeprazole) .... One Cap By Mouth Once Daily  Allergies (verified): 1)  ! Penicillin 2)  ! Keflex 3)  ! Macrodantin 4)  ! Codeine 5)  ! Sulfa 6)  ! Cleocin 7)  ! Tamiflu (Oseltamivir Phosphate) 8)  ! Dilaudid (Hydromorphone Hcl) 9)  ! Biaxin  Past History:  Past medical, surgical, family and social histories (including risk factors) reviewed, and no changes noted (except as noted below).  Past Medical History: Reviewed history  from 06/14/2009 and no changes required. CHEST TIGHTNESS-PRESSURE-OTHER ([id number]) DYSPNEA (ICD-786.05) HYPERLIPIDEMIA (ICD-272.4) OBESITY (ICD-278.00) FATIGUE, CHRONIC (ICD-780.79) NAUSEA WITH VOMITING (ICD-787.01) CYSTITIS (ICD-595.9) ANEMIA, IRON DEFICIENCY, CHRONIC (ICD-280.9) FIBROMYALGIA (ICD-729.1) ANXIETY, CHRONIC (ICD-300.00) HYPOTHYROIDISM (ICD-244.9) ADENOCARCINOMA, BREAST, LEFT (ICD-174.9) ABDOMINAL PAIN, GENERALIZED (ICD-789.07) KNEE PAIN, BILATERAL (ICD-719.46) NECK PAIN, CHRONIC (ICD-723.1) BACK PAIN, CHRONIC (ICD-724.5) SMALL BOWEL OBSTRUCTION, HX OF (ICD-V12.79) SUPRAVENTRICULAR TACHYCARDIA, HX OF (ICD-V12.59) ABDOMINAL PAIN, HX OF  (ICD-V15.89) Hx of VAGINITIS (ICD-616.10)  Past Surgical History: Caesarean section 1986 Cholecystectomy 2003 Colectomy 2003  Hysterectomy 1986 C-spine back surgery 2002 Urethral dialation  Mastectomy left 2006 [place] filter placement due to PE / DVT HX nipple and breast reconstruction, left and repair of abdominal scar 12/2009 dr Dina Rich  Family History: Reviewed history from 09/22/2010 and no changes required. ONE CHILD MOTHER  60 DM FATHER  60 MAC DEG ONE BROTHER STILLBORN neither parent is overweight  Social History: Reviewed history from 03/16/2008 and no changes required. DISABLED Married Never Smoked Alcohol use-no Drug use-no  Review of Systems      See HPI Eyes:  Complains of vision loss-both eyes. GU:  Complains of dysuria and urinary frequency; pelvic mass, 3 day h/o cystitis symptoms,pt hasinterstitial cystitis. MS:  Complains of joint pain and stiffness. Psych:  Complains of anxiety. Endo:  Denies cold intolerance, excessive hunger, excessive thirst, and excessive urination. Heme:  Denies abnormal bruising and bleeding. Allergy:  Complains of seasonal allergies.  Physical Exam  General:  Well-developedobese in no acute distress; alert,appropriate and cooperative throughout examination HEENT: No facial asymmetry,  EOMI, No sinus tenderness, TM's Clear, oropharynx  pink and moist.   Chest: Clear to auscultation bilaterally.  CVS: S1, S2, No murmurs, No S3.   Abd: Soft, Nontender.  MS: Adequate ROM spine, hips, shoulders and knees.  Ext: No edema.   CNS: CN 2-12 intact, power tone and sensation normal throughout.   Skin: Intact, no visible lesions or rashes.  Psych: Good eye contact, normal affect.  Memory intact, not anxious or depressed appearing.    Impression & Recommendations:  Problem # 1:  OVARIAN CYST (ICD-620.2) Assessment [facility]  Problem # 2:   IMPAIRED FASTING GLUCOSE (ICD-790.21) Assessment: Unchanged  Orders: T- Hemoglobin A1C ([id number])  Problem # 3:  UNSPECIFIED HYPOTHYROIDISM (ICD-244.9) Assessment: Comment Only  Her updated medication list for this problem includes:    Synthroid 100 Mcg Tabs (Levothyroxine sodium) .Marland Kitchen... Take 1 tablet by mouth once a day  Orders: T-TSH [phone])  Labs Reviewed: TSH: 0.031 (09/09/2010)    HgBA1c: 5.9 (09/09/2010) Chol: 170 (09/09/2010)   HDL: 41 (09/09/2010)   LDL: 76 (09/09/2010)   TG: 264 (09/09/2010)  Problem # 4:  DEPRESSION (ICD-311) Assessment: Improved  Her updated medication list for this problem includes:    Alprazolam 1 Mg Tabs (Alprazolam) .Marland Kitchen... Take 1 tablet by mouth four times a day as needed  Problem # 5:  HYPERLIPIDEMIA (ICD-272.4) Assessment: Comment Only  Her updated medication list for this problem includes:    Pravachol 40 Mg Tabs (Pravastatin sodium) ..... One tab by mouth at bedtime discontinue simvastatin Low fat dietdiscussed and encouraged  Orders: T-Hepatic Function [phone]) T-Lipid Profile [phone])  Labs Reviewed: SGOT: 16 (09/09/2010)   SGPT: 13 (09/09/2010)   HDL:41 (09/09/2010), 48 (06/10/2010)  LDL:76 (09/09/2010), 81 (06/10/2010)  Chol:170 (09/09/2010), 172 (06/10/2010)  Trig:264 (09/09/2010), 216 (06/10/2010)  Problem # 6:  ACUTE CYSTITIS (ICD-595.0) Assessment: Comment Only  Orders: UA Dipstick W/ Micro (manual) (  81000)  Encouraged to push clear liquids, get enough rest, and take acetaminophen as needed. To be seen in 10 days if no improvement, sooner if worse.  Complete Medication List: 1)  Miralax Powd (Polyethylene glycol 3350) .Marland KitchenMarland KitchenMarland Kitchen 17 grams two times a day 2)  Docusate Sodium 100 Mg Tabs (Docusate sodium) .... Take 1 tablet by mouth two times a day 3)  Biotin 5000 5 Mg Caps (Biotin) .... Take 1 tablet by mouth two times a day 4)  Alprazolam 1 Mg Tabs (Alprazolam) .... Take 1 tablet by mouth four times a day as  needed 5)  Spironolactone 25 Mg Tabs (Spironolactone) .... One tab by mouth bid 6)  Cyclobenzaprine Hcl 10 Mg Tabs (Cyclobenzaprine hcl) .... One tab by mouth tid 7)  Nitrostat 0.4 Mg Subl (Nitroglycerin) .... Uad 8)  Verapamil Hcl 120 Mg Tabs (Verapamil hcl) .... One tab every other day 9)  Pravachol 40 Mg Tabs (Pravastatin sodium) .... One tab by mouth at bedtime discontinue simvastatin 10)  Synthroid 100 Mcg Tabs (Levothyroxine sodium) .... Take 1 tablet by mouth once a day 11)  Vitamin D3 2000 Unit Caps (Cholecalciferol) .Marland Kitchen.. 1 by mouth once daily 12)  Vitamin E 1000 Unit Caps (Vitamin e) .Marland Kitchen.. 1 by mouth once daily 13)  Vitamin C 1000 Mg Tabs (Ascorbic acid) .Marland Kitchen.. 1 by mouth once daily 14)  Vitamin B-12 1000 Mcg Tabs (Cyanocobalamin) .Marland Kitchen.. 1 by mouth once daily 15)  Phentermine Hcl 37.5 Mg Caps (Phentermine hcl) .... Take 1 tablet by mouth once a day 16)  Omeprazole 40 Mg Cpdr (Omeprazole) .... One cap by mouth once daily  Other Orders: T-Basic Metabolic Panel [phone]) T-CBC w/Diff [id number]) T-Vitamin D (25-Hydroxy) [phone]) Medicare Electronic Prescription [phone])  Patient Instructions: 1)  Please schedule a follow-up appointment in 3 months. 2)  It is important that you exercise regularly at least 30 minutes 5 times a week. If you develop chest pain, have severe difficulty breathing, or feel very tired , stop exercising immediately and seek medical attention. 3)  You need to lose weight. Consider a lower calorie diet and regular exercise.  4)  BMP prior to visit, ICD-9: 5)  Hepatic Panel prior to visit, ICD-9: 6)  Lipid Panel prior to visit, ICD-9: 7)  TSH prior to visit, ICD-9:   fasting  today 8)  CBC w/ Diff prior to visit, ICD-9: 9)  HbgA1C prior to visit, ICD-9: 10)  Vitamin D Prescriptions: PHENTERMINE HCL 37.5 MG CAPS (PHENTERMINE HCL) Take 1 tablet by mouth once a day  #30 x 1   Entered by:   Everitt Amber LPN   Authorized by:   Syliva Overman MD   Signed  by:   Everitt Amber LPN on 84/13/2440   Method used:   Printed then faxed to ...       [facility], [facility] (retail)       [address]       [place], [place]  [address]       Ph: [phone]       Fax: [phone]   RxID:   [id number]    Orders Added: 1)  Est. Patient Level IV [30160] 2)  T-Basic Metabolic Panel [phone] 3)  T-Hepatic Function [80076-22960] 4)  T-Lipid Profile [80061-22930] 5)  T-CBC w/Diff [22025-42706] 6)  T-TSH [23762-83151] 7)  T- Hemoglobin A1C [83036-23375] 8)  T-Vitamin D (25-Hydroxy) [76160-73710] 9)  UA Dipstick W/ Micro (manual) [81000] 10)  Medicare Electronic Prescription [  [id number]]    Laboratory Results   Urine Tests  Date/Time Received: February 10, 2011  Date/Time Reported: February 10, 2011   Routine Urinalysis   Color: lt. yellow Appearance: Clear Glucose: negative   (Normal Range: Negative) Bilirubin: negative   (Normal Range: Negative) Ketone: trace (5)   (Normal Range: Negative) Spec. Gravity: 1.025   (Normal Range: 1.003-1.035) Blood: negative   (Normal Range: Negative) pH: 5.5   (Normal Range: 5.0-8.0) Protein: negative   (Normal Range: Negative) Urobilinogen: 0.2   (Normal Range: 0-1) Nitrite: negative   (Normal Range: Negative) Leukocyte Esterace: negative   (Normal Range: Negative)

## 2011-02-24 NOTE — Letter (Signed)
Summary: obstetric and gynecology  obstetric and gynecology   Imported By: Lind Guest 02/19/2011 11:52:44  _____________________________________________________________________  External Attachment:    Type:   Image     Comment:   External Document

## 2011-03-06 ENCOUNTER — Telehealth: Payer: Self-pay | Admitting: Family Medicine

## 2011-03-10 NOTE — Progress Notes (Signed)
  Phone Note Call from Patient   Summary of Call: [name] called in and stated that since she was here last she has been burning when she urinates and now she is hurting in her back and wanting to know if we would call antibiotic in. Advised urgent care since the office was already closed for the day and to call back to come in Monday if she chose not to go Initial call taken by: Everitt Amber LPN,  March 06, 2011 2:53 PM

## 2011-03-12 ENCOUNTER — Telehealth (INDEPENDENT_AMBULATORY_CARE_PROVIDER_SITE_OTHER): Payer: Self-pay | Admitting: *Deleted

## 2011-03-13 ENCOUNTER — Ambulatory Visit (INDEPENDENT_AMBULATORY_CARE_PROVIDER_SITE_OTHER): Payer: Medicare Other

## 2011-03-13 ENCOUNTER — Encounter: Payer: Self-pay | Admitting: Family Medicine

## 2011-03-13 DIAGNOSIS — N3000 Acute cystitis without hematuria: Secondary | ICD-10-CM

## 2011-03-13 DIAGNOSIS — M549 Dorsalgia, unspecified: Secondary | ICD-10-CM

## 2011-03-13 LAB — CONVERTED CEMR LAB
Bilirubin Urine: NEGATIVE
Glucose, Urine, Semiquant: NEGATIVE
Protein, U semiquant: NEGATIVE
pH: 6

## 2011-03-14 ENCOUNTER — Encounter: Payer: Self-pay | Admitting: Family Medicine

## 2011-03-16 ENCOUNTER — Encounter: Payer: Self-pay | Admitting: Family Medicine

## 2011-03-17 ENCOUNTER — Encounter (HOSPITAL_COMMUNITY): Payer: Medicare Other | Attending: Oncology

## 2011-03-17 ENCOUNTER — Other Ambulatory Visit (HOSPITAL_COMMUNITY): Payer: Medicare Other

## 2011-03-17 ENCOUNTER — Ambulatory Visit (HOSPITAL_COMMUNITY): Payer: Medicare Other

## 2011-03-17 DIAGNOSIS — Z853 Personal history of malignant neoplasm of breast: Secondary | ICD-10-CM | POA: Insufficient documentation

## 2011-03-17 DIAGNOSIS — N390 Urinary tract infection, site not specified: Secondary | ICD-10-CM | POA: Insufficient documentation

## 2011-03-17 DIAGNOSIS — Z86718 Personal history of other venous thrombosis and embolism: Secondary | ICD-10-CM | POA: Insufficient documentation

## 2011-03-17 NOTE — Progress Notes (Signed)
Summary: bladder infection  Phone Note Call from Patient   Summary of Call: would like to get a antibotic for kidney and bladder infection. [phone]  [facility] Initial call taken by: Rudene Anda,  March 12, 2011 9:25 AM  Follow-up for Phone Call        please have patient come in for nurse visit UA Follow-up by: Adella Hare LPN,  March 12, 2011 10:24 AM  Additional Follow-up for Phone Call Additional follow up Details #1::        pt was called and left voice mail for her. Letting her know she needed to come in for nurse visit Additional Follow-up by: Rudene Anda,  March 12, 2011 11:18 AM

## 2011-03-18 ENCOUNTER — Ambulatory Visit (HOSPITAL_COMMUNITY): Payer: Medicare Other

## 2011-03-19 ENCOUNTER — Ambulatory Visit (HOSPITAL_COMMUNITY): Payer: Medicare Other

## 2011-03-20 ENCOUNTER — Ambulatory Visit (HOSPITAL_COMMUNITY): Payer: Medicare Other

## 2011-03-21 ENCOUNTER — Ambulatory Visit (HOSPITAL_COMMUNITY): Payer: Medicare Other

## 2011-03-23 ENCOUNTER — Other Ambulatory Visit: Payer: Self-pay | Admitting: Family Medicine

## 2011-03-23 ENCOUNTER — Telehealth: Payer: Self-pay | Admitting: Family Medicine

## 2011-03-23 DIAGNOSIS — R509 Fever, unspecified: Secondary | ICD-10-CM

## 2011-03-23 DIAGNOSIS — R5383 Other fatigue: Secondary | ICD-10-CM

## 2011-03-23 DIAGNOSIS — R5381 Other malaise: Secondary | ICD-10-CM

## 2011-03-23 DIAGNOSIS — N390 Urinary tract infection, site not specified: Secondary | ICD-10-CM

## 2011-03-24 LAB — CBC WITH DIFFERENTIAL/PLATELET
Basophils Absolute: 0 10*3/uL (ref 0.0–0.1)
Basophils Relative: 0 % (ref 0–1)
Eosinophils Absolute: 0.1 10*3/uL (ref 0.0–0.7)
Eosinophils Relative: 1 % (ref 0–5)
HCT: 40.9 % (ref 36.0–46.0)
Lymphocytes Relative: 26 % (ref 12–46)
MCH: 30.5 pg (ref 26.0–34.0)
MCHC: 33.7 g/dL (ref 30.0–36.0)
MCV: 90.3 fL (ref 78.0–100.0)
Monocytes Absolute: 0.5 10*3/uL (ref 0.1–1.0)
Platelets: 187 10*3/uL (ref 150–400)
RDW: 13.6 % (ref 11.5–15.5)
WBC: 7.4 10*3/uL (ref 4.0–10.5)

## 2011-03-24 LAB — URINALYSIS, ROUTINE W REFLEX MICROSCOPIC
Bilirubin Urine: NEGATIVE
Nitrite: NEGATIVE
Protein, ur: NEGATIVE mg/dL
Specific Gravity, Urine: 1.024 (ref 1.005–1.030)
Urobilinogen, UA: 0.2 mg/dL (ref 0.0–1.0)

## 2011-03-24 LAB — BASIC METABOLIC PANEL
BUN: 17 mg/dL (ref 6–23)
Calcium: 9.1 mg/dL (ref 8.4–10.5)
Chloride: 104 mEq/L (ref 96–112)
Creat: 0.84 mg/dL (ref 0.40–1.20)

## 2011-03-26 NOTE — Assessment & Plan Note (Signed)
Summary: urine check per jamie   Vital Signs:  Patient profile:   50 year old female Menstrual status:  hysterectomy Weight:      241 pounds BP sitting:   108 / 80  (left arm)  Vitals Entered By: Adella Hare LPN (March 13, 2011 10:37 AM) CC: burning with urination and bilateral flank pain x 3 days, also chronic lower back pain   CC:  burning with urination and bilateral flank pain x 3 days and also chronic lower back pain.  Allergies: 1)  ! Penicillin 2)  ! Keflex 3)  ! Macrodantin 4)  ! Codeine 5)  ! Sulfa 6)  ! Cleocin 7)  ! Tamiflu (Oseltamivir Phosphate) 8)  ! Dilaudid (Hydromorphone Hcl) 9)  ! Biaxin   Complete Medication List: 1)  Miralax Powd (Polyethylene glycol 3350) .Marland KitchenMarland KitchenMarland Kitchen 17 grams two times a day 2)  Docusate Sodium 100 Mg Tabs (Docusate sodium) .... Take 1 tablet by mouth two times a day 3)  Biotin 5000 5 Mg Caps (Biotin) .... Take 1 tablet by mouth two times a day 4)  Alprazolam 1 Mg Tabs (Alprazolam) .... Take 1 tablet by mouth four times a day as needed 5)  Spironolactone 25 Mg Tabs (Spironolactone) .... One tab by mouth bid 6)  Cyclobenzaprine Hcl 10 Mg Tabs (Cyclobenzaprine hcl) .... One tab by mouth tid 7)  Nitrostat 0.4 Mg Subl (Nitroglycerin) .... Uad 8)  Verapamil Hcl 120 Mg Tabs (Verapamil hcl) .... One tab every other day 9)  Pravachol 40 Mg Tabs (Pravastatin sodium) .... One tab by mouth at bedtime discontinue simvastatin 10)  Synthroid 100 Mcg Tabs (Levothyroxine sodium) .... Take 1 tablet by mouth once a day 11)  Vitamin D3 2000 Unit Caps (Cholecalciferol) .Marland Kitchen.. 1 by mouth once daily 12)  Vitamin E 1000 Unit Caps (Vitamin e) .Marland Kitchen.. 1 by mouth once daily 13)  Vitamin C 1000 Mg Tabs (Ascorbic acid) .Marland Kitchen.. 1 by mouth once daily 14)  Vitamin B-12 1000 Mcg Tabs (Cyanocobalamin) .Marland Kitchen.. 1 by mouth once daily 15)  Phentermine Hcl 37.5 Mg Caps (Phentermine hcl) .... Take 1 tablet by mouth once a day 16)  Omeprazole 40 Mg Cpdr (Omeprazole) .... One cap by mouth  once daily 17)  Ciprofloxacin Hcl 500 Mg Tabs (Ciprofloxacin hcl) .... One tab by mouth two times a day  Other Orders: Urinalysis ([id number]) T-Culture, Urine ([id number]) Ketorolac-Toradol 15mg  (O9629) Admin of Therapeutic Inj  intramuscular or subcutaneous (52841) Prescriptions: CIPROFLOXACIN HCL 500 MG TABS (CIPROFLOXACIN HCL) one tab by mouth two times a day  #10 x 0   Entered by:   Adella Hare LPN   Authorized by:   Syliva Overman MD   Signed by:   Adella Hare LPN on 32/44/0102   Method used:   Electronically to        [facility] , [facility] (retail)       [address]       [place], [place]  [address]       Ph: [phone]       Fax: [phone]   RxID:   [id number]    Medication Administration  Injection # 1:    Medication: Ketorolac-Toradol 15mg     Diagnosis: BACK PAIN, CHRONIC (ICD-724.5)    Route: IM    Site: RUOQ gluteus    Exp Date: 07/28/2012    Lot #: [id number]    Mfr: [email or web address]    Comments: toradol 60mg  given    Patient tolerated  injection without complications    Given by: Adella Hare LPN (March 13, 2011 10:39 AM)  Orders Added: 1)  Urinalysis [81003-65000] 2)  T-Culture, Urine [[id number]] 3)  Ketorolac-Toradol 15mg  [J1885] 4)  Admin of Therapeutic Inj  intramuscular or subcutaneous [96372]     Medication Administration  Injection # 1:    Medication: Ketorolac-Toradol 15mg     Diagnosis: BACK PAIN, CHRONIC (ICD-724.5)    Route: IM    Site: RUOQ gluteus    Exp Date: 07/28/2012    Lot #: [id number]    Mfr: [email or web address]    Comments: toradol 60mg  given    Patient tolerated injection without complications    Given by: Adella Hare LPN (March 13, 2011 10:39 AM)  Orders Added: 1)  Urinalysis [81003-65000] 2)  T-Culture, Urine [[id number]] 3)  Ketorolac-Toradol 15mg  [J1885] 4)  Admin of Therapeutic Inj  intramuscular or subcutaneous [96372]   Laboratory Results   Urine Tests  Date/Time Received: March 13, 2011 10:42 AM  Date/Time Reported: March 13, 2011 10:42 AM   Routine Urinalysis   Color: yellow Appearance: Clear Glucose: negative   (Normal Range: Negative) Bilirubin: negative   (Normal Range: Negative) Ketone: negative   (Normal Range: Negative) Spec. Gravity: 1.015   (Normal Range: 1.003-1.035) Blood: negative   (Normal Range: Negative) pH: 6.0   (Normal Range: 5.0-8.0) Protein: negative   (Normal Range: Negative) Urobilinogen: 0.2   (Normal Range: 0-1) Nitrite: negative   (Normal Range: Negative) Leukocyte Esterace: trace   (Normal Range: Negative)        Appended Document: urine check per jamie pt is symptomatic for bladder infection, she does have interstitial cystitis also, but will prescribe antibiotic pending culture report. he c/o uncontrolled back pain and toradol is approved for admin, 60mg

## 2011-03-26 NOTE — Letter (Signed)
Summary: pharmacy  pharmacy   Imported By: Lind Guest 03/17/2011 15:29:41  _____________________________________________________________________  External Attachment:    Type:   Image     Comment:   External Document

## 2011-03-31 LAB — DIFFERENTIAL
Lymphocytes Relative: 24 % (ref 12–46)
Monocytes Absolute: 0.6 10*3/uL (ref 0.1–1.0)
Monocytes Relative: 6 % (ref 3–12)
Neutro Abs: 6.9 10*3/uL (ref 1.7–7.7)

## 2011-03-31 LAB — URINALYSIS, ROUTINE W REFLEX MICROSCOPIC
Glucose, UA: NEGATIVE mg/dL
Ketones, ur: NEGATIVE mg/dL
pH: 6 (ref 5.0–8.0)

## 2011-03-31 LAB — COMPREHENSIVE METABOLIC PANEL
AST: 15 U/L (ref 0–37)
Albumin: 4 g/dL (ref 3.5–5.2)
BUN: 16 mg/dL (ref 6–23)
Creatinine, Ser: 0.93 mg/dL (ref 0.4–1.2)
GFR calc Af Amer: >60 mL/min (ref >60)
Potassium: 4 mEq/L (ref 3.5–5.1)
Total Protein: 7.3 g/dL (ref 6.0–8.3)

## 2011-03-31 LAB — CBC
HCT: 42 % (ref 36.0–46.0)
MCV: 92.6 fL (ref 78.0–100.0)
Platelets: 214 10*3/uL (ref 150–400)
RDW: 12.9 % (ref 11.5–15.5)

## 2011-04-03 NOTE — Telephone Encounter (Signed)
Telephone response from pt, routed to nursing actions to be taken to address

## 2011-04-04 LAB — RAPID URINE DRUG SCREEN, HOSP PERFORMED
Amphetamines: NOT DETECTED
Barbiturates: NOT DETECTED
Opiates: NOT DETECTED

## 2011-04-04 LAB — CBC
HCT: 35.5 % — ABNORMAL LOW (ref 36.0–46.0)
HCT: 36 % (ref 36.0–46.0)
HCT: 40.2 % (ref 36.0–46.0)
Hemoglobin: 12.4 g/dL (ref 12.0–15.0)
Hemoglobin: 13.8 g/dL (ref 12.0–15.0)
MCHC: 34.6 g/dL (ref 30.0–36.0)
MCHC: 34.7 g/dL (ref 30.0–36.0)
MCHC: 35.3 g/dL (ref 30.0–36.0)
MCV: 91.7 fL (ref 78.0–100.0)
MCV: 92.1 fL (ref 78.0–100.0)
MCV: 91.8 fL (ref 78.0–100.0)
Platelets: 110 10*3/uL — ABNORMAL LOW (ref 150–400)
Platelets: 133 10*3/uL — ABNORMAL LOW (ref 150–400)
RBC: 3.91 MIL/uL (ref 3.87–5.11)
RBC: 3.92 MIL/uL (ref 3.87–5.11)
RBC: 4.26 MIL/uL (ref 3.87–5.11)
RDW: 14.3 % (ref 11.5–15.5)
RDW: 14.3 % (ref 11.5–15.5)
WBC: 5.1 10*3/uL (ref 4.0–10.5)
WBC: 6.9 10*3/uL (ref 4.0–10.5)

## 2011-04-04 LAB — HEPATIC FUNCTION PANEL
ALT: 19 U/L (ref 0–35)
Alkaline Phosphatase: 74 U/L (ref 39–117)
Bilirubin, Direct: 0.1 mg/dL (ref 0.0–0.3)
Indirect Bilirubin: 0.6 mg/dL (ref 0.3–0.9)

## 2011-04-04 LAB — BASIC METABOLIC PANEL
BUN: 5 mg/dL — ABNORMAL LOW (ref 6–23)
BUN: 6 mg/dL (ref 6–23)
BUN: 9 mg/dL (ref 6–23)
CO2: 25 mEq/L (ref 19–32)
CO2: 30 mEq/L (ref 19–32)
CO2: 27 mEq/L (ref 19–32)
Calcium: 8.1 mg/dL — ABNORMAL LOW (ref 8.4–10.5)
Calcium: 8.1 mg/dL — ABNORMAL LOW (ref 8.4–10.5)
Chloride: 107 mEq/L (ref 96–112)
Chloride: 109 mEq/L (ref 96–112)
Chloride: 111 mEq/L (ref 96–112)
Chloride: 107 mEq/L (ref 96–112)
Creatinine, Ser: 0.76 mg/dL (ref 0.4–1.2)
Creatinine, Ser: 0.78 mg/dL (ref 0.4–1.2)
Creatinine, Ser: 0.85 mg/dL (ref 0.4–1.2)
GFR calc Af Amer: >60 mL/min (ref >60)
GFR calc Af Amer: >60 mL/min (ref >60)
GFR calc Af Amer: >60 mL/min (ref >60)
GFR calc non Af Amer: >60 mL/min (ref >60)
GFR calc non Af Amer: >60 mL/min (ref >60)
Glucose, Bld: 100 mg/dL — ABNORMAL HIGH (ref 70–99)
Glucose, Bld: 106 mg/dL — ABNORMAL HIGH (ref 70–99)
Glucose, Bld: 110 mg/dL — ABNORMAL HIGH (ref 70–99)
Glucose, Bld: 113 mg/dL — ABNORMAL HIGH (ref 70–99)
Potassium: 3.1 mEq/L — ABNORMAL LOW (ref 3.5–5.1)
Potassium: 3.9 mEq/L (ref 3.5–5.1)
Potassium: 4 mEq/L (ref 3.5–5.1)
Potassium: 3.7 mEq/L (ref 3.5–5.1)
Sodium: 141 mEq/L (ref 135–145)
Sodium: 139 mEq/L (ref 135–145)

## 2011-04-04 LAB — DIFFERENTIAL
Basophils Absolute: 0 10*3/uL (ref 0.0–0.1)
Basophils Absolute: 0.1 10*3/uL (ref 0.0–0.1)
Basophils Relative: 0 % (ref 0–1)
Basophils Relative: 0 % (ref 0–1)
Basophils Relative: 1 % (ref 0–1)
Eosinophils Absolute: 0.1 10*3/uL (ref 0.0–0.7)
Eosinophils Absolute: 0.1 10*3/uL (ref 0.0–0.7)
Eosinophils Absolute: 0.2 10*3/uL (ref 0.0–0.7)
Eosinophils Absolute: 0.1 10*3/uL (ref 0.0–0.7)
Eosinophils Relative: 1 % (ref 0–5)
Eosinophils Relative: 2 % (ref 0–5)
Eosinophils Relative: 2 % (ref 0–5)
Lymphocytes Relative: 33 % (ref 12–46)
Lymphocytes Relative: 35 % (ref 12–46)
Lymphs Abs: 2 10*3/uL (ref 0.7–4.0)
Lymphs Abs: 1.4 10*3/uL (ref 0.7–4.0)
Monocytes Absolute: 0.4 10*3/uL (ref 0.1–1.0)
Monocytes Absolute: 0.4 10*3/uL (ref 0.1–1.0)
Monocytes Absolute: 0.6 10*3/uL (ref 0.1–1.0)
Monocytes Relative: 8 % (ref 3–12)
Monocytes Relative: 9 % (ref 3–12)
Neutro Abs: 2.8 10*3/uL (ref 1.7–7.7)
Neutrophils Relative %: 54 % (ref 43–77)
Neutrophils Relative %: 71 % (ref 43–77)
Neutrophils Relative %: 69 % (ref 43–77)

## 2011-04-04 LAB — T3: T3, Total: 92.6 ng/dl (ref 80.0–204.0)

## 2011-04-04 LAB — STOOL CULTURE

## 2011-04-04 LAB — URINE MICROSCOPIC-ADD ON

## 2011-04-04 LAB — CLOSTRIDIUM DIFFICILE EIA: C difficile Toxins A+B, EIA: NEGATIVE

## 2011-04-04 LAB — URINE CULTURE

## 2011-04-04 LAB — URINALYSIS, ROUTINE W REFLEX MICROSCOPIC
Bilirubin Urine: NEGATIVE
Glucose, UA: NEGATIVE mg/dL
Hgb urine dipstick: NEGATIVE
Specific Gravity, Urine: >1.03 — ABNORMAL HIGH (ref 1.005–1.030)
Urobilinogen, UA: 0.2 mg/dL (ref 0.0–1.0)
pH: 6.5 (ref 5.0–8.0)

## 2011-04-04 LAB — FECAL LACTOFERRIN, QUANT: Fecal Lactoferrin: NEGATIVE

## 2011-04-04 LAB — LIPASE, BLOOD: Lipase: 22 U/L (ref 11–59)

## 2011-04-04 LAB — TSH: TSH: 0.181 u[IU]/mL — ABNORMAL LOW (ref 0.350–4.500)

## 2011-04-04 LAB — PROTIME-INR: INR: 1.1 (ref 0.00–1.49)

## 2011-04-04 LAB — APTT: aPTT: 25 seconds (ref 24–37)

## 2011-04-04 LAB — ETHANOL: Alcohol, Ethyl (B): <5 mg/dL (ref 0–10)

## 2011-04-08 LAB — COMPREHENSIVE METABOLIC PANEL
ALT: 31 U/L (ref 0–35)
AST: 32 U/L (ref 0–37)
Albumin: 2.5 g/dL — ABNORMAL LOW (ref 3.5–5.2)
Alkaline Phosphatase: 113 U/L (ref 39–117)
BUN: 18 mg/dL (ref 6–23)
GFR calc Af Amer: >60 mL/min (ref >60)
Potassium: 2.6 mEq/L — CL (ref 3.5–5.1)
Sodium: 139 mEq/L (ref 135–145)
Total Protein: 5 g/dL — ABNORMAL LOW (ref 6.0–8.3)

## 2011-04-08 LAB — CBC
MCHC: 34.2 g/dL (ref 30.0–36.0)
RDW: 14.3 % (ref 11.5–15.5)

## 2011-04-08 LAB — URINALYSIS, ROUTINE W REFLEX MICROSCOPIC
Hgb urine dipstick: NEGATIVE
Nitrite: NEGATIVE
Specific Gravity, Urine: >1.03 — ABNORMAL HIGH (ref 1.005–1.030)
Urobilinogen, UA: 0.2 mg/dL (ref 0.0–1.0)
pH: 6 (ref 5.0–8.0)

## 2011-04-08 LAB — DIFFERENTIAL
Basophils Absolute: 0 10*3/uL (ref 0.0–0.1)
Basophils Relative: 0 % (ref 0–1)
Neutro Abs: 2.7 10*3/uL (ref 1.7–7.7)
Neutrophils Relative %: 59 % (ref 43–77)

## 2011-04-08 LAB — URINE MICROSCOPIC-ADD ON

## 2011-04-30 ENCOUNTER — Telehealth: Payer: Self-pay | Admitting: Family Medicine

## 2011-04-30 NOTE — Telephone Encounter (Signed)
Her she is in pain

## 2011-05-01 NOTE — Telephone Encounter (Signed)
I spoke with [name] yesterday, and advised I could not oK an admission as she requests for more affordable access to dental care. I do understand the significance off gum disease, and it's potential impact on total health, she voiced specific concerns about heart disease . I advised her to contact her dentist for any help she could get. She described gum pain , and concern about infection,Ii advised [name] evaluation if all else failed

## 2011-05-06 ENCOUNTER — Encounter: Payer: Self-pay | Admitting: Family Medicine

## 2011-05-12 NOTE — Discharge Summary (Signed)
NAME:  [name], [name]               ACCOUNT NO.:  [id number]   MEDICAL RECORD NO.:  [id number]          PATIENT TYPE:  INP   LOCATION:  A202                          FACILITY:  [facility]   PHYSICIAN:  Melissa L. Ladona Ridgel, MD  DATE OF BIRTH:  10-10-1961   DATE OF ADMISSION:  08/11/2009  DATE OF DISCHARGE:  08/18/2010LH                               DISCHARGE SUMMARY   DISCHARGE DIAGNOSES:  1. Syncope likely secondary to orthostasis.  The patient was taken off      her clonidine, rehydrated and has currently been asymptomatic with      regard to any dizziness or syncope.  She will be discharged to home      without further clonidine medication and we have also held her      clonazepam and she is going quite well without that medication.  2. Hypotension secondary to clonidine she was using for detox of      OxyContin.  [name] blood pressures have  been stable without      symptoms of dizziness or near syncope, presyncope.  The patient      still does have occasional low blood pressure which she has had      previously prior to detoxing and with exertion does exhibit some      minor tachycardia.  We will check her orthostatics prior to      discharge and if these are within normal limits and she is      asymptomatic, I will let her discharge to home. This morning her      orthostatic blood pressures revealed a minor drop in blood      pressures from change in position but she was not symptomatic.      Lying her blood pressure was 121/73, pulse 73; sitting blood      pressure 106/78 with a pulse of 98 and 104/70 with a pulse of 108      standing.  Again, the patient was not dizzy nor did she experience      any palpitations at that time so I will recheck those prior to      discharge and see how she does.  I have encouraged her to maintain      reasonable hydration and to call if she develops any worsening      symptoms of dizziness or difficulty walking.  3. Facial trauma secondary to  fall and fracture.  The patient still      has a slight tenderness to the right face but that is improving.      She does have bilateral ecchymosis that is periorbital which is      improving as well.  We discussed how to control the pain to that      area.  At this time we will attempt to just use Tylenol and an      occasional anti-inflammatory medication.  4. Right corneal abrasion.  The patient has made an appointment in the      outpatient setting for evaluation on Friday.  I will discharge her  to home on antibiotic eye drops, namely Zymar, 2 drops to the eye      q.6h.. until she sees the eye doctor.  5. History of supraventricular tachycardia status post ablation.  The      patient displayed a normal sinus rhythm with minor exertional      tachycardia none of which appears to be abnormal.  She will      followup with her primary care physician with regard to this.  6. History of thromboembolic event, status post inferior vena cava      filter placement.  The patient is clinically stable with regard to      this.  7. On August 16, the patient was evaluated for possible urinary tract      infection and has been on Cipro which is scheduled to be      discontinued today.  Therefore will not discharge her to home with      any antibiotic medications.  8. Anxiety disorder.  The patient is scheduled for outpatient      psychologist counseling with regard to her current anxiety issues      surrounding multiple past events.  She states Dr. Lodema Hong has      already arranged for that and I feel comfortable she will discharge      to that setting.  Please note the patient did describe feeling as      if her pain was so overwhelming when she left for the hospital at      [facility] that she did threaten to kill herself, however in      talking with her further, she expresses no suicidal or homicidal      ideation. She feels that she has been adequately heard and that her      needs  are being addressed.  She does not feel that she needs to      hurt herself in any way and is in fact, very proud of herself for      going through what she has with regard to the detox.  I feel that      this mentality is healthy and that she will benefit greatly from      outpatient psychotherapy.  I will continue her on her Xanax.      Currently she has been on Xanax 0.5 mg q.6h.. as needed.  She      relates to me that Dr. Lodema Hong has increased her to 1 mg four times      a day but she does not necessarily take that.  I will recommend at      discharge continuing the 0.5 mg and only going to the 1 mg if she      is not tolerating that.  9. Hypothyroidism.  Will continue her Synthroid.  10.Fibromyalgia. She will continue her Flexeril.  11.Detox plan.  The patient is scheduled to have a followup      appointment with Dr. Dominga Ferry on Monday of this week.  She will      therefore will call to reschedule that.  I did attempt to call the      GI office but unfortunately missed the office closing.  I will      therefore request transcription to Dr. Corena Pilgrim office so she knows      about the admission.   MEDICATIONS AT THE TIME OF DISCHARGE:  1. Levoxyl 175 mcg daily.  Decreased to 100 mcg after  noting decreased      TSH.  Will need recheck TSH in next few weeks  2. Flexeril 10 mg daily.  3. Xanax 0.5 mg q.6h.. as needed for anxiety.  4. Lovastatin 40 mg at bedtime.  5. Cymbalta 120 mg daily.  6. Miralax 7 grams p.o. daily.  7. Trazodone; her home dose was 1.5 tablets which I suspect was 75 mg      but she was unable to give me that information so we will just      resume that at bedtime.  8. Clonazepam 2 mg twice a day has been discontinued and I will not      resume that.  9. Colace 100 mg twice daily.  10.Seroquel 200 mg daily.  11.Dicyclomine 10 mg q.6h..  12.Ibuprofen as directed and as needed.  I have asked her to please be      careful and avoid that so she does not cause any  irritation to her      stomach.  Tylenol would actually be a little bit better for her.  13.Nexium 40 mg twice a day will be continued.  14.We have discontinued her clonidine patch and will not be resuming      that.  15.Promethazine; the patient has this at home and she really does not      use a lot of.  I will therefore will let her continue that as      previous but will not provide her with any prescription.  Addendum: Zymar two drops to right eye  every six hours until you see  the eye doctor.   HOSPITAL COURSE:  The patient is a 50 year old  Caucasian female who has  spent the last 24 days at the [place] [facility] [place] at [facility] being detoxed from OxyContin.  The patient was discharged to home  on a detoxification regime which consisted of clonidine, clonazepam and  her usual Xanax and Cymbalta.  The patient noted that after returning  home, she felt dizzy, lightheaded and was falling a lot.  She also had  several syncopal episodes and ultimately fell on her face, particularly  damaging her right eye.  The patient states that she has tried to call  the detox center 3 days prior to the incident but was unable to develop  a contact to discuss the problem.  She therefore was brought to the  emergency room after her event.  The patient was found in the emergency  room to be significantly orthostatic, hypotensive and this was felt to  be secondary to her clonidine patch.  The patient states in general she  has a low blood pressure which otherwise  had not bothered her in the  past.   The patient was admitted to the general medical floor with treatment  with IV fluids and re-evaluation by orthostatics.  Each day those have  improved.  She was treated for a brief course of antibiotic therapy for  an asymptomatic urinary tract infection.  She received 3 days of  ciprofloxacin.  The patient over the course of admission, did have minor  symptoms of abdominal discomfort and  nausea for which she was treated  with IV medications.  Initially she was treated with IV Dilaudid but we  slowly weaned that down and she really only required 0.5 of Dilaudid  today.  She feels she will be fine at home and does not wish to escalate  or add any narcotics  at the time of discharge. We discussed a pain plan  which would include Tylenol and occasional ibuprofen.  I asked her to be  very careful with the ibuprofen and if she develops any abdominal  discomfort or nausea then she should immediate contact Dr. Lodema Hong or  her GI physician.  On the day of discharge, the patient has been  ambulating in the hallway with her IV  pole.  She denies any dizziness.  A repeat set of orthostatic vital signs show a lying blood pressure of  108/65, pulse 82, sitting blood pressure 104/79 with heart rate of 114  and a standing blood pressure of 102/67 with heart rate  of 100.  She is  completely asymptomatic.   PHYSICAL EXAMINATION:  GENERAL:  This is a moderately obese white female  in no acute distress.  She is articulate.  She shows raccoon eyes with ecchymosis surrounding both eyes and  tenderness to the right zygomatic arch area.  She generally has pupils  that are equal, round and not injected.  She is anicteric.  Examination  of nose reveals septum midline without discharge.  Examination of mouth  reveals mild poor dentition but no lip lesions or oral lesions.  NECK:  Supple.  There is no JVD.  No lymph nodes or carotid bruits.  Breath sounds decreased but clear to auscultation.  There are no  rhonchi, rales or wheezing.  CARDIOVASCULAR:  Regular rate and rhythm.  Positive S1 and S2.  No S3,  S4.  No murmurs, rubs or gallops.  ABDOMEN:  Soft, nontender, nondistended with positive bowel sounds.  There is no hepatosplenomegaly, no guarding or rebound.  EXTREMITIES:  No clubbing, cyanosis or edema.  NEUROLOGIC:  She is awake, alert and oriented.  Cranial nerves II  through XII are  intact. DTRs are 2+, plantars are down going.  PSYCHIATRIC:  Affect is appropriate.  Recent and remote memory appear to  be intact except for those times when she was really ill during detox  which is appropriate.  She denies any suicidal or homicidal ideation.  In fact, she feels fairly well and feels confident that she has a plan  at this time and that she is leaving the hospital in a timely manner  with adequate followup.   PERTINENT LABORATORIES DURING THIS HOSPITAL STAY:  Reveal urine culture  which is completely negative.  Sodium 139, potassium 3.9, chloride 111,  CO2 25, BUN 5, creatinine 0.7 and glucose of 106.  White blood cell  count is 5.6, hemoglobin 12.2, hematocrit 35.5 and platelets of 110.  T4  was 142, TSH was 0.058.  On the 16th, this was not rechecked. I  therefore will ask her to decrease her Synthroid to 100 mcg once daily  until she sees Dr. Syliva Overman at which time, I would recommend  having a repeat TSH drawn.   Last PT was 25 with PT INR of 15.9, INR 1.1.  Alcohol level during this  admission was less than 5.  Drug screen was positive for benzos which  she does take at home.  Urine pregnancy was negative.  Lipase was 11.   EKG during this admission revealed normal sinus rhythm with low voltage.  No ST-T wave changes were noted.  No pauses were noted.  She was on  telemetry for a brief period of time which showed normal sinus rhythm  with no difficulties.   At this time the patient is deemed stable for discharge to home with  followup with the eye doctor, Dr. Syliva Overman and to Dr. Dominga Ferry  office at [facility].   CONDITION ON DISCHARGE:  Stable.   DISCHARGE DIET:  Heart healthy.   Total time on this discharge is less than 30 minutes.      Melissa L. Ladona Ridgel, MD  Electronically Signed     MLT/MEDQ  D:  08/14/2009  T:  08/14/2009  Job:  [id number]   cc:   Dr. Lajean Silvius [facility]/ GI  Clinic   Dr. Hedy Jacob [facility] E. Lodema Hong, M.D.  Fax: [phone]

## 2011-05-12 NOTE — Group Therapy Note (Signed)
NAMEMIGUEL, MEDAL               ACCOUNT NO.:  [id number]   MEDICAL RECORD NO.:  [id number]          PATIENT TYPE:  INP   LOCATION:  A212                          FACILITY:  [facility]   PHYSICIAN:  Melissa L. Ladona Ridgel, MD  DATE OF BIRTH:  12/31/1960   DATE OF PROCEDURE:  08/18/2009  DATE OF DISCHARGE:  08/19/2009                                 PROGRESS NOTE   Please see the accompanying written note in chart.   Subjectively, the patient is feeling much better this evening she was  able to eat fast food with her husband.  She complains of still having  headache but she said it is improved.  She has had some cramping  abdominal discomfort but that is also intermittent and tolerable.  Her  blood pressure has finally been reasonably elevated and she has been  able to get up and around without difficulty.   VITAL SIGNS:  Temperature 98.6, blood pressure 109/66, pulse 83,  respirations 20, saturation 95% on room air.  GENERAL:  This is a moderately obese white female in no acute distress  this evening.  HEENT:  She is normocephalic, atraumatic.  Pupils equal round and  reactive to light.  Extraocular muscles intact.  Mucous membranes are  moist.  the right eye is not erythematous and she is has improved  ecchymosis around her eyes.  NECK:  Is supple.  There is no JVD.  No lymph nodes.  No carotid bruits.  CHEST:  Clear to auscultation.  There is no rhonchi, rales or wheezes.  CARDIOVASCULAR:  Regular rate and rhythm.  Positive S1, S2.  No S3, S4,  no murmurs, rubs, or gallops.  ABDOMEN:  Soft, nontender, nondistended with positive bowel sounds.  There is no hepatosplenomegaly.  No guarding or rebound.  EXTREMITIES:  Show no clubbing, cyanosis, or edema.  NEUROLOGICALLY:  She is awake, alert, oriented.  Cranial nerves II-XII  are intact.  Power is 5/5.  DTRs 2+.  Plantars downgoing.   ASSESSMENT/PLAN:  A 50 year old white female readmitted after recent  admission for hypotension with a  fall.  She was readmitted for colitis  which may be related to low blood pressure.  We have discontinued her  Seroquel which may be contributing to that.  Today her abdomen is much  improved.  In fact, she is eating fast food.  We appreciate Dr. Pecola Leisure  assistant and note the following recommendations.  1. Constipation which may be contributing to colonic inertia and      therefore could also be contributing to her colitis.  We are      starting Amitiza.  2. Headache for now Tylenol, not adding opiates at this time.  If she      continues to have headache in the morning I will consider CT      scanning her head.  3. Colitis continue antibiotics p.o.   DISPOSITION:  If she is clinically stable overnight and remains with  reasonable blood pressures I would discharge her in the morning.  Total  time on this case was 20 minutes.  Melissa L. Ladona Ridgel, MD  Electronically Signed     MLT/MEDQ  D:  08/26/2009  T:  08/26/2009  Job:  [id number]

## 2011-05-12 NOTE — Op Note (Signed)
NAME:  [name], [name]               ACCOUNT NO.:  [id number]   MEDICAL RECORD NO.:  [id number]          PATIENT TYPE:  AMB   LOCATION:  DAY                           FACILITY:  [facility]   PHYSICIAN:  R. Roetta Sessions, M.D. DATE OF BIRTH:  [birth year]   DATE OF PROCEDURE:  08/18/2007  DATE OF DISCHARGE:                               OPERATIVE REPORT   ESOPHAGOGASTRODUODENOSCOPY WITH A GIVEN SMALL BOWEL CAPSULE DEPLOYMENT:   INDICATIONS FOR PROCEDURE:  A 50 year old lady with profound iron-  deficiency anemia, status post total colectomy.  We have attempted to  image her small bowel with a Given capsule but the capsule did not leave  her stomach prior to having her ingest the imaging capsule.  We had her  swallow an agile (dummy) capsule in the past without any evidence of  hanging up in her small bowel.  She is felt to have drug-induced  gastroparesis so therefore an EGD is now being done to facilitate the  study by deploying the capsule into the small bowel.  This approach has  discussed with the patient at length.  Potential risks, benefits,  alternatives have been reviewed, questions answered.  She is agreeable.  Please see documentation in the medical record.   PROCEDURE NOTE:  O2 saturation, blood pressure, pulse and respirations  were monitored throughout the entire procedure.  Conscious sedation:  Versed 5 mg IV, Demerol 125 mg IV in divided doses.  Phenergan 25 mg  diluted slow IV push prior to the procedure to augment conscious  sedation.  Cetacaine spray for topical oropharyngeal anesthesia.  Instrument:  Pentax video chip system.   Findings:  Examination of the tubular esophagus revealed normal mucosa.  Previously-noted esophagitis is resolved.  EG junction easily traversed.   Stomach:  Gastric cavity, aside from having minimal bile-stained mucus,  was empty and insufflated well with air.  A thorough examination of the  gastric mucosa including retroflexed view of the proximal  stomach and  esophagogastric junction demonstrated a small hiatal hernia.  Pylorus  patent and easily traversed.  Examination of the bulb and second portion  revealed no abnormalities.   Therapeutic/diagnostic maneuvers performed:  The scope was withdrawn.  The Given deployment device was attached through the channel in the  scope and the capsule was attached to the end of it.  The apparatus was  reintroduced in the esophagus and the capsule was easily hauled down to  the stomach and placed across the pylorus.  It was deployed.  The  capsule was last seen well into the third portion of the duodenum.  The  patient tolerated the procedure very well.   IMPRESSION:  1. Normal esophagus with a small hiatal hernia.  2. Bile-stained mucus, gastric mucosa otherwise normal and patent.  3. Pylorus normal, D1 through 2.  4. Status post deployment of Given capsule with via endoscopy as      described above.   RECOMMENDATIONS:  Given capsule instructions given to Ms. Laurine Blazer.  Will  review data when it becomes available.      Jonathon Bellows, M.D.  Electronically Signed  RMR/MEDQ  D:  08/18/2007  T:  08/19/2007  Job:  [id number]   cc:   Jacqualine Mau  Fax: [phone]   Boris M. Darovsky, M.D.  Fax: [phone]

## 2011-05-12 NOTE — Consult Note (Signed)
NAME:  [name], [name]               ACCOUNT NO.:  [id number]   MEDICAL RECORD NO.:  [id number]          PATIENT TYPE:  OBV   LOCATION:  A212                          FACILITY:  [facility]   PHYSICIAN:  Kassie Mends, M.D.      DATE OF BIRTH:  10/21/1961   DATE OF CONSULTATION:  DATE OF DISCHARGE:                                 CONSULTATION   REFERRING PHYSICIAN:  Melissa L. Ladona Ridgel, M.D.   PRIMARY GASTROENTEROLOGIST:  Jonathon Bellows, M.D., and Mellody Memos, M.D., at [facility].   REASON FOR CONSULTATION:  Abdominal pain.   HISTORY OF PRESENT ILLNESS:  Ms. Harold is a 50 year old female who was  initially seen by Dr. Jena Gauss in July 2008 for iron-deficiency anemia.  She was noted to have severe colonic inertia with intractable  constipation.  She underwent a subtotal colectomy by Dr. Katrinka Blazing. Her post-  surgical course was complicated by a small bowel obstruction and  possibly narcotic bowel syndrome.  She was recently hospitalized in July  2010 at [facility].  She was detoxed from OxyContin, which she had  been on for 11 years.  She was discharged from [facility] on August 2.  She  presented to the emergency department on August 15 with a syncopal  episode.  She remained hospitalized until August 18.  Her syncope was  thought secondary to hypotension.  She had evidence of facial trauma  without evidence of fracture and a right corneal abrasion.  She was  noted to have extensive bruising on the right side of her face.   She was discharged and yesterday evening had the sudden onset of  abdominal cramping.  She said she had been managed as an outpatient by  Dr. Dominga Ferry with MiraLax.  She usually does not have abdominal cramps.  Her abdominal cramping was followed by watery stools and then no bowel  movements.  The symptoms began at 6 p.m. yesterday.  She did vomit 4  times but saw no blood.  It was white foamy material.  She did see 2  pieces of fecal matter.  Her cramps were not like  a viral infection, but  she thought she might have an obstruction, so she came back to the  emergency room.  She did have dry heaves and nausea.  She denies any  blood from her rectum.  The abdominal cramping comes in waves.  The  Dilaudid usually takes the edge off but does not completely relieve her  symptoms.   PAST MEDICAL HISTORY:  1. Normal catheterization in 2004.  2. Iron-deficiency anemia workup in 2008 by Dr. Jena Gauss.  3. TIA.  4. Atrial fibrillation/flutter with subsequent ablation.  5. History of MRSA.  6. Anxiety.  7. Depression.  8. IVC filter secondary to history of PE.  9. Gastroesophageal reflux disease.  10.Interstitial cystitis.  11.Lactose intolerance.  12.Hiatal hernia.  13.Breast cancer.   PAST SURGICAL HISTORY:  1. Hysterectomy.  2. C-section.  3. Appendectomy.  4. Breast implants bilaterally and then removal of the left breast.  5. Cervical disk surgery.  6. Excision of  a perirectal abscess.   ALLERGIES:  CODEINE, CLINDAMYCIN, PENICILLIN, KEFLEX, MACRODANTIN,  SULFA.   MEDICATIONS:  1. Cipro.  2. Flexeril.  3. Bentyl.  4. Cymbalta 120 mg p.o. daily.  5. Lovenox.  6. Synthroid.  7. Flagyl 500 mg IV q.8 h.  8. Zofran as needed.  9. Protonix twice a day.  10.Zocor q.h.s.  11.Xanax as needed.  12.Dilaudid as needed.   FAMILY HISTORY:  She has 3 second-degree relatives with colon cancer.   SOCIAL HISTORY:  She is married.  She has 1 daughter.  Her daughter is a  Engineer, civil (consulting).   REVIEW OF SYSTEMS:  She weighed 216 pounds in 2008.  She currently  weighs 179 pounds.  Her review of systems per the HPI; otherwise, all  systems are negative.   PHYSICAL EXAMINATION:  VITAL SIGNS:  T-max 97.8, systolic blood pressure  92-83, pulse 68, respiratory rate 18, O2 sat 96% on room air.  GENERAL:  She is in no apparent distress, alert and oriented x4.HEENT:  Exam is consistent with trauma.  She has ecchymosis of the right side of  her face and in her periorbital  regions.  Her pupils are equal and  reactive to light.  Her mouth has no oral lesions.NECK:  Full range of  motion.  No lymphadenopathy.LUNGS:  Clear to auscultation  bilaterally.CARDIOVASCULAR:  Regular rhythm. ABDOMEN:  Bowel sounds are  present.  Soft, tenderness to palpation (mild to moderate) in the right  lower quadrant and right periumbilical region. EXTREMITIES:  No cyanosis  or edema.  NEUROLOGIC:  No focal neurologic deficits.   LABORATORIES:  On August 19, white count 8.4, hemoglobin 13.9, platelets  133.  No stool studies obtained.   RADIOGRAPHIC STUDIES:  I personally reviewed her CT scan with Dr. Tyron Russell.  She had a CT scan of the abdomen and pelvis without contrast on  08/15/2009.  She does have some mild changes in the right lower quadrant  that are consistent with colitis.  She has some pericolic stranding that  extends into the duodenum.   ASSESSMENT:  Ms. Beachem is a 50 year old female with multiple  comorbidities and history of colonic inertia resulting in a subtotal  colectomy.  She presented with abdominal cramping and has a CT scan that  shows changes consistent with colitis.  The most likely etiology is  ischemic colitis.  The differential diagnosis includes abdominal pain  secondary to colonic inertia.  Thank you for allowing me to see Ms.  Laurine Blazer in consultation.  My recommendations follow.   RECOMMENDATIONS:  1. I did discuss the drawbacks of using Bentyl for spasm in Ms.      Laurine Blazer.  She would prefer to have the Bentyl discontinued.  I do      not disagree.  We will add MiraLax t.i.d. with chocolate milk per      the patient's request.  2. Add Flora-Q.  3. Will check a lipase and hepatic function panel.  4. Continue a low-fat diet.  5. Agree with aggressive hydration and continue her rate at 150 mL/hr.  6. Consider a cardiology consult for her hypotension.  7. Agree with followup with Dr. Dominga Ferry.  She has an appointment on      August 26.  If it has  not been done already, she should have a pain      consult.  Her hypotension is limiting the ability to give her      adequate pain relief.      Sandi  Cira Servant, M.D.  Electronically Signed     SM/MEDQ  D:  08/16/2009  T:  08/16/2009  Job:  [id number]   cc:   Dr. Mellody Memos  [facility]  Div. of Gastroenterology and Hepatology   R. Roetta Sessions, M.D.  [address]  [place]  [place] [address]

## 2011-05-12 NOTE — Op Note (Signed)
NAME:  [name], [name]               ACCOUNT NO.:  [id number]   MEDICAL RECORD NO.:  [id number]          PATIENT TYPE:  AMB   LOCATION:  DAY                           FACILITY:  [facility]   PHYSICIAN:  R. Roetta Sessions, M.D. DATE OF BIRTH:  18-May-1961   DATE OF PROCEDURE:  07/13/2007  DATE OF DISCHARGE:                               OPERATIVE REPORT   PROCEDURE:  EGD with esophageal biopsy followed by small bowel biopsy.   INDICATIONS FOR PROCEDURE:  The patient is a 50 year old lady with a  history of breast cancer who is status post total proctocolectomy for  colonic inertia who is referred at the courtesy of Dr. Earma Reading in  [place] for further evaluation of iron-deficiency anemia.   As far as I know, there is no history GI bleeding, but she has marked  iron-deficiency anemia as documented by Dr. Earma Reading.  She does  have a history of chronic gastroesophageal reflux disease, but those  symptoms are pretty well squelched on Nexium.  EGD is now being done to  further evaluate her iron deficiency anemia.  This approach has been  discussed with the patient at length.  The potential risks, benefits,  and alternatives have been reviewed and questions answered.  She is  agreeable.  Please send documentation in the medical record.   PROCEDURE NOTE:  O2 saturation, blood pressure, pulse, and respirations  were monitored throughout the entire procedure.   CONSCIOUS SEDATION:  Phenergan 25 mg slow IV push prior to procedures  well as well as Demerol 125 mg IV and Versed 6 mg IV.  Cetacaine spray  topical pharyngeal anesthesia.   INSTRUMENT:  Pentax video chip system.   FINDINGS:  Examination of the tubular esophagus revealed a couple of  distal esophageal ulcers.  One was about 1 x 1 cm with overlying  exudate.  The other two were smaller, on the order of 4-5 mm.  They were  in the middle of the distal one-third of the esophagus.  The intervening  mucosa and mucosa between the ulcers  the EG junction appeared entirely  normal.  There was no evidence of Barrett's esophagus or neoplasm.  There was no plaquing concerning for Candida esophagitis either.  Please  see photos.  EG junction was easily traversed.   Stomach:  The gastric cavity had some retained food within it.  It  insufflated well with air.  I ultimately was able to see the gastric  mucosa very well with washing and suctioning the food debris out.  Thorough examination of the gastric mucosa including retroflexion in the  proximal stomach, esophagogastric junction demonstrated only a small  hiatal hernia.  Pylorus was patent, easily traversed.  Examination of  the bulb, second, and third portion revealed no mucosal abnormalities.   THERAPEUTIC/DIAGNOSTIC MANEUVERS PERFORMED:  Multiple biopsies of D2 and  D3 were taken to rule out villous atrophy.  Subsequently, the esophageal  ulcers were biopsied for histologic study.   The patient tolerated the procedure very well and was reactivated.   ENDOSCOPIC IMPRESSION:  1. Distal esophageal ulcers, not typical for gastroesophageal  reflux      disease, etiology uncertain, biopsied, otherwise normal esophagus.  2. Small hiatal hernia, retained gastric contents (suspect drug-      induced gastroparesis), otherwise normal gastric mucosa.  3. Patent pylorus normal D1-3, status post biopsies D2-D3.   RECOMMENDATIONS:  Will go ahead and check H. pylori serologies, as H.  pylori infection is associated iron-deficiency anemia.  Follow up on a  biopsies, and with then make further recommendations.  If above workup  is unrevealing at the cause for iron-deficiency anemia, will go ahead  and proceed with a Givens small bowel capsule study.  However, given her  history of multiple bouts of bowel obstruction with adhesions, would  administer the dummy capsule first to make sure that the actual camera  capsule will pass without difficulty.   I discussed the findings and  recommendations with Dr. Earma Reading via  telephone this afternoon.      Jonathon Bellows, M.D.  Electronically Signed     RMR/MEDQ  D:  07/13/2007  T:  07/14/2007  Job:  [id number]   cc:   Lebron Conners. Darovsky, M.D.  Fax: [phone]   Milus Mallick. Lodema Hong, M.D.  Fax: [phone]

## 2011-05-12 NOTE — Procedures (Signed)
NAME:  [name], [name]               ACCOUNT NO.:  [id number]   MEDICAL RECORD NO.:  [id number]          PATIENT TYPE:  OUT   LOCATION:  RAD                           FACILITY:  [facility]   PHYSICIAN:  Gerrit Friends. Dietrich Pates, MD, FACCDATE OF BIRTH:  [birth year]   DATE OF PROCEDURE:  DATE OF DISCHARGE:  01/22/2009                                ECHOCARDIOGRAM   REFERRING PHYSICIAN:  Milus Mallick. Lodema Hong, MD   CLINICAL DATA:  A 50 year old woman with chronic and atypical chest  pain.  1. Treadmill exercise performed to a workload of 10 METs and a heart      rate of 179, 103% of age-predicted maximum.  Exercise discontinued      due to dyspnea and fatigue; no chest discomfort reported.  2. Blood pressure increased from a resting value of 100/60 to 140/70      during exercise, a normal response.  3. No arrhythmias noted.  4. EKG:  Normal sinus rhythm; low voltage; otherwise normal.  5. Stress EKG:  No significant change.  6. Baseline echocardiogram:  Somewhat technically difficult; normal      chamber dimensions; no significant valvular abnormalities; normal      left ventricular size and function; no LVH.  7. Post-exercise images:  Limited apical views were available;      parasternal images were diagnostic.  Contractility was hyperdynamic      at rest.  There was a clear increase in contractility in the      anterolateral, inferoposterior and inferoseptal segments; the      increase in contraction anteroseptally was less impressive.   IMPRESSION:  Probably normal stress echocardiogram revealing adequate  exercise tolerance, no significant structural heart abnormalities, a  normal stress EKG, and an essentially normal stress echocardiogram.      Gerrit Friends. Dietrich Pates, MD, [facility]  Electronically Signed     RMR/MEDQ  D:  01/29/2009  T:  01/29/2009  Job:  [id number]

## 2011-05-12 NOTE — Discharge Summary (Signed)
[name], [name]               ACCOUNT NO.:  [id number]   MEDICAL RECORD NO.:  [id number]          PATIENT TYPE:  INP   LOCATION:  A212                          FACILITY:  [facility]   PHYSICIAN:  Melissa L. Ladona Ridgel, MD  DATE OF BIRTH:  07-25-1961   DATE OF ADMISSION:  08/15/2009  DATE OF DISCHARGE:  08/23/2010LH                               DISCHARGE SUMMARY   CONTINUATION:  Please see the previously dictated discharge summary  which was unfortunately disrupted and note the following.   PHYSICAL EXAMINATION:  EXTREMITIES:  Showed no clubbing, cyanosis or  edema.  NEUROLOGICALLY:  She is awake, alert, oriented.  Cranial nerves II-XII  are intact.  Power is 5/5.  DTRs are 2+.  Plantars are downgoing.   PERTINENT LABORATORIES:  During the course of this hospital stay.  These  revealed negative stool cultures, negative lactoferrin testing, negative  C. diff cultures x2.  T4 was 1.12, T3  92.6, and her TSH was 0.181.  Lipase was 22.  Her last sodium was 141, potassium 3.5, chloride 112,  CO2 of 26, BUN 8, creatinine 0.76, and her white count was 5.2 with  hemoglobin of 12.4, hematocrit 35.9, platelets of 118.   At this time, the patient was deemed stable for further evaluation as an  outpatient.  Unfortunately, this is a very complex case with multiple  reasons behind her symptoms.  I have encouraged her to keep close tabs  with her GI physician to see and her primary care physician.   DISPOSITION:  To home.   DIET:  Advance as tolerated to a regular diet.   CONDITION AT DISCHARGE:  Stable.   Addendum: Please note the patient will complete 10 days of abx.  I also  checked a CT since her headache was still present on the day of  discharge,  she had no residual intercranial bleeding, s/p a fall which  percipitated the previous addmission.   Melissa L. Ladona Ridgel, M.D.      Melissa L. Ladona Ridgel, MD  Electronically Signed     MLT/MEDQ  D:  08/26/2009  T:  08/26/2009  Job:   [id number]   cc:   Dr. Lodema Hong   Mellody Memos, MD

## 2011-05-12 NOTE — Letter (Signed)
June 11, 2008    Milus Mallick. Lodema Hong, M.D.  [address]  [place], [place] [address]   RE:  [name], [name]  MRN:  [id number]  /  DOB:  [birth year]   Dear Claris Che:   [name] returns to the office after 1-year absence.  When I last saw  her, she was experiencing episodic mid substernal chest discomfort that  had the characteristics of esophageal or coronary spasm.  She started on  amlodipine 2.5 mg daily, which was of no benefit.  She was never able to  return for followup and drug titration.  She has required multiple  admissions to [facility] for recurrent bowel obstructions.  Her surgeon  wishes to treat her conservatively.  She has her usual host of serious  medical issues.  Her breast reconstruction is complete except for  simulating an areola.  She thinks she will not undergo any additional  breast surgery.  She continues to be kept off warfarin with an IVC  filter in place.  She is experiencing recurrent chest discomfort with  radiation to the throat and jaw associated with dyspnea and sometimes  with nausea.  She has had some radiation into the right and left arm.  There is no association to exertion.  Symptoms last seconds to minutes.  She will not take nitroglycerin due to her fear of recurrent migraine.   CURRENT MEDICATIONS:  1. Levothyroxine 0.175 mg daily.  2. Aldactone 50 mg daily.  3. OxyContin.  4. Flexeril 10 mg t.i.d.  5. Xanax 0.5 mg q.i.d.  6. Lovastatin 20 mg daily.  7. Senokot.  8. MiraLax.  9. Benefiber.   EXAMINATION:  A pleasant overweight woman.  The weight is 221, 15 pounds  more than last year.  Blood pressure 110/80, heart rate 70 and regular,  respirations 16.  NECK:  No jugular venous distention; normal carotid upstrokes without  bruits; anterior surgical scar.  LUNGS:  Clear.  CARDIAC:  Normal first and second heart sounds; normal PMI.  ABDOMEN:  Soft and nontender; no masses; no organomegaly.  EXTREMITIES:  No edema; distal pulses  intact.   IMPRESSION:  [name] is clinically stable with continuing chest  discomfort.  We will restart amlodipine at a dose of 5 mg daily and  consider titration to 10 mg daily.  She is also to start a proton pump  inhibitor.  We may want to further investigate her for esophageal spasm  at some point.  I will plan to reassess her symptoms in 2 months.      Sincerely,      Gerrit Friends. Dietrich Pates, MD, [facility]  Electronically Signed    RMR/MedQ  DD: 06/11/2008  DT: 06/12/2008  Job #: [id number]

## 2011-05-12 NOTE — Consult Note (Signed)
NAME:  [name], [name]               ACCOUNT NO.:  [id number]   MEDICAL RECORD NO.:  [id number]          PATIENT TYPE:  AMB   LOCATION:  DAY                           FACILITY:  [facility]   PHYSICIAN:  R. Roetta Sessions, M.D. DATE OF BIRTH:  11-Mar-1961   DATE OF CONSULTATION:  DATE OF DISCHARGE:                                 CONSULTATION   REASON FOR CONSULTATION:  Iron-deficiency.   PRIMARY CARE PHYSICIAN:  Milus Mallick. Lodema Hong, M.D.   HPI:  [name] is a 50 year old female with a quite complicated past  medical history.  She is being referred through the courtesy of Dr.  Ubaldo Glassing.  She was recently found to have low iron and ferritin by Dr.  Lodema Hong, her primary care Greyden Besecker.  Her GI history is complicated in  the fact that she has had severe colonic inertia with intractable  constipation.  She underwent a side-to-side ileorectal anastomosis by  Dr. Katrinka Blazing on 08/04/02.  Her last colonoscopy was prior to that surgery  by Dr. Gerilyn Nestle on 06/15/02.  The colonoscopy was only completed to the  hepatic flexure because of a large amount of stool in the proximal  colon.  She had an EGD at the same time.  She was found to have a small  sliding hiatal hernia and, otherwise, normal EGD.  She had a small bowel  follow through on 06/08/03 which showed very slow transit through the  proximal small bowel with some dilatation of the small bowel.  She has a  history of multiple abdominal surgeries including multiple bowel  obstructions.  She has been seen by multiple physicians at [facility].  Her last  laparoscopic study was done in 2007 where she was found to have  significant adhesive disease.   She denies any upper GI symptoms including heartburn, indigestion,  anorexia or early satiety.  She denies any nausea or vomiting.  She is  having some left lower quadrant abdominal pain.  She denies any  blood  donation.  She does have a history of multiple transfusions, all of  which were postoperatively.   She has 6-7 loose bowel movements a day.  Denies any rectal bleeding or melena.  She has been on iron for  approximately a month, yet tells me her lab work did not change.  She is  currently not on iron supplementation.  Lab work from Dr. Anthony Sar  office, 04/25/07, shows a normal vitamin B 12 and folate, an iron of 29,  a ferritin of 6, a percent sat of 8, and a TIBC of 49, a hemoglobin of  11.2 with a hematocrit of 36.4 and MCV of 81.6 on 04/25/07.  She had a  normal metabolic panel at that time, a normal TSH of 0.523 as well as  normal LFT's.   PAST MEDICAL AND SURGICAL HISTORY:  She had total colectomy as detailed  in the HPI, last colonoscopy and EGD as detailed in the HPI.  She has  had multiple abdominal surgeries (see HPI).  She has a history of  adhesive bowel disease and partial small bowel obstructions.  She has  a  history of breast cancer, status post mastectomy in 2006.  She is  followed by Dr. Ubaldo Glassing.  She is status post appendectomy, C section.  She has required multiple transfusions, post surgically, for significant  anemia.  She is status post partial hysterectomy.  She has had breast  implants which were removed 10 years later.  She has had breast  reconstructive surgeries.  She has a history of atrial fibrillation and  atrial flutter status post ablation.  She has had neck surgery.  She has  a history of TIA.  She has history of pulmonary emboli and has been on  Coumadin in the past.  She has a history of hyperthyroidism,  hyperlipidemia, chronic pain, chronic fatigue, history of recurrent  MRSA, history of anxiety and depression.  She does have an IVC filter.  Chronic GERD, on Nexium for 5 years now.   CURRENT MEDICATIONS:  Xanax 0.5 mg q.i.d., lovastatin 10 mg nightly,  levothyroxine 175 mcg daily, aldactone 50 mg daily, metformin 500 mg  b.i.d., Peri-Colace 2 daily, Norvasc 2.5 mg daily, oxycodone ER 80 mg  t.i.d., oxycodone IR 5 mg 2 q.6 hours p.r.n., Flexeril  10 mg t.i.d.,  Nexium 40 mg daily, Phenergan 25 mg p.r.n. and NitroQuick 0.4 mg p.r.n.   ALLERGIES:  PENICILLIN, KEFLEX, MACRODANTIN, CLEOCIN, SULFA AND CODEINE.   FAMILY HISTORY:  Positive for 2 maternal uncles with colon cancer as  well as a maternal grandfather with colon cancer.   SOCIAL HISTORY:  [name] has been married for 15 years.  She has 1  healthy daughter who is a Engineer, civil (consulting).  She is disabled.  She denies any  tobacco, alcohol or drug use.   REVIEW OF SYSTEMS:  See HPI; otherwise, negative.   PHYSICAL EXAM:  VITAL SIGNS:  Weight 216.5 pounds, height 63-1/2 inches.  Temp 99.2, blood pressure 128/78, pulse 88.  GENERAL:  [name] is a well-developed, well-nourished Caucasian  female in no acute distress.  HEENT:  Sclerae clear, nonicteric. Conjunctivae pink, oropharynx pink  and moist without any lesions.  NECK:  Supple without thyromegaly.  CHEST:  Heart regular rate and rhythm.  Normal S1-S2.  ABDOMEN:  Positive bowel sounds x4.  No bruits auscultated.  Abdomen is  soft, nondistended.  She does have a firm nodule just below the multiple  well-healed scars of her abdomen and in her mid abdomen.  This area is  quite tender.  There is no rebound tenderness or guarding.  No  hepatosplenomegaly or mass.  EXTREMITIES:  Without clubbing or edema bilaterally.  SKIN:  Pink, warm and dry without rash or jaundice.   IMPRESSION:  [name] is a 50 year old female with iron-deficiency  anemia.  She is status post total colectomy.  She has chronic  gastroesophageal reflux disease, well controlled on PPI.  I have  discussed this case with Dr. Jena Gauss.  Given her total colectomy, etiology  of her iron-deficiency anemia could be multifactorial.  We need to be  sure that she does not have peptic ulcer disease as well as H. pylori,  small bowel arteriovenous malformations remain a differential as does  celiac disease and the least likely would be rectal carcinoma.   PLAN:  1.  occult stools x3.  2. She is going to need an EGD with Dr. Jena Gauss in the near future.  I      have discussed this procedure including the risks and benefits      including but not limited to bleeding,  infection, perforation, drug      reaction.  She agreed with the plan.  Consent will be obtained.      She may need small bowel capsule study if this is negative.      However,      given her extensive history of abdominal surgeries and adhesive      disease would be concerned with capsule lodging.  She may need a      'dummy' capsule prior to imaging.   We would like to thank Dr. Lodema Hong and Dr. Ubaldo Glassing for allowing [place] to  participate in the care of [name].      Lorenza Burton, N.P.      Jonathon Bellows, M.D.  Electronically Signed    KJ/MEDQ  D:  07/08/2007  T:  07/08/2007  Job:  [id number]   cc:   Milus Mallick. Lodema Hong, M.D.  Fax: [phone]   Boris M. Darovsky, M.D.  Fax: [phone]

## 2011-05-12 NOTE — Op Note (Signed)
NAME:  [name], [name]               ACCOUNT NO.:  [id number]   MEDICAL RECORD NO.:  [id number]          PATIENT TYPE:  AMB   LOCATION:  DAY                           FACILITY:  [facility]   PHYSICIAN:  Kassie Mends, M.D.      DATE OF BIRTH:  Oct 11, 1961   DATE OF PROCEDURE:  08/05/2007  DATE OF DISCHARGE:  08/05/2007                               OPERATIVE REPORT   PROCEDURE:  Small bowel Givens capsule study.   INDICATIONS FOR PROCEDURE:  [name] is a 50 year old female with a  history of breast cancer who is status post total proctocolectomy for  colonic inertia who was referred through the courtesy of Dr. Zollie Pee in [place] for further evaluation of iron deficiency anemia. She  has a history of chronic GERD well-controlled on Nexium. She underwent  EGD by Dr. Jena Gauss on July 13, 2007 which showed distal esophageal ulcers  not typical for GERD which were benign when biopsied.  She had a small  hiatal hernia.  She also had retained gastric contents, suspected drug-  induced gastroparesis and otherwise normal gastric mucosa. She had a  patent pylorus and normal D1 through D3 and biopsies from the duodenum  were normal.  She underwent two dummy capsule studies to verify patency  of the small bowel. The first one the timing was incorrect and on the  second study just prior to the capsule dissolving it appeared to be  found in the rectum.  It was felt that we could proceed with Givens  capsule study at this time.   FINDINGS:  The first gastric image was at 1 minute and 21 seconds.  She  had significant retained gastric food contents, minimal mucosa was  visualized throughout the entire 8 hour study.  It appears that she had  significant gastroparesis. I was unable to visualize mucosa as well as  landmarks and I suspect given the images and the significant amount of  food that capsule may not have left the stomach.  Incomplete study.   SUMMARY AND RECOMMENDATIONS:  This is an incomplete  study due to  retained gastric food contents and the fact that it appears the capsule  may not have left the stomach after 8 hours.  I will be discussing this  study further with Dr. Jena Gauss upon his return to determine whether we  need to repeat this study and if so  whether the capsule is going to  need to be placed via EGD or whether we need to initiate treatment for  gastroparesis.      Lorenza Burton, N.P.      Kassie Mends, M.D.  Electronically Signed    KJ/MEDQ  D:  08/09/2007  T:  08/10/2007  Job:  [id number]   cc:   Lebron Conners. Darovsky, M.D.  Fax: [phone]

## 2011-05-12 NOTE — H&P (Signed)
NAMESRIYA, KROEZE               ACCOUNT NO.:  [id number]   MEDICAL RECORD NO.:  [id number]          PATIENT TYPE:  OBV   LOCATION:  A212                          FACILITY:  [facility]   PHYSICIAN:  Osvaldo Shipper, MD     DATE OF BIRTH:  [birth year]   DATE OF ADMISSION:  08/15/2009  DATE OF DISCHARGE:  LH                              HISTORY & PHYSICAL   PMD:  Dr. Syliva Overman.   The patient was actually recently discharged on August 18th after a stay  for orthostatic hypotension and falls resulting in facial injury.   ADMISSION DIAGNOSIS:  1. Colitis, likely ischemic.  2. Orthostatic hypotension.  3. Recent facial trauma with no fracture.  4. Right corneal abrasion.   CHIEF COMPLAINT:  Abdominal pain and cramps with diarrhea.   HISTORY OF PRESENT ILLNESS:  The patient is a 50 year old Caucasian  female who is well-known to [place] from recent admission.  The patient had a  3-day stay for orthostatic hypotension and fall.  She was given IV  fluids.  She slowly improved.  Blood pressure improved.  She started  feeling better, and she was sent home.  The patient tells me that she  did okay initially and then started having lower abdominal pain and  cramps followed by diarrhea.  Denies any blood in the diarrhea.  She was  also nauseous and had a few episodes of emesis.  The pain in the lower  abdomen was 10/10 in intensity, sharp in character, radiating to the  back.  No precipitating aggravating or relieving factors identified.  She tells me that when she experienced these symptoms, her neighbor  checked her blood pressure, which was in the low 60s and 70s.  She  denied any passing out spells recently.  Denies any other falls at this  time.   MEDICATIONS AT HOME:  Levoxyl 175 mcg once a day, Flexeril 10 mg once a  day, Xanax 0.5 mg every 6 hours as needed, lovastatin 40 mg at bedtime.  Cymbalta 120 mg once a day, MiraLax 17 gm at bed once a day, trazodone  unknown dose at bedtime,  Colace 100 mg twice daily, Seroquel 1 mg once  daily, dicyclomine 10 mg 4 times daily, ibuprofen as needed, Nexium 40  mg twice a day.  Phenergan unknown dose as needed for nausea.   ALLERGIES:  BIAXIN, CLEOCIN, CODEINE, KEFLEX, PENICILLIN, PERCOCET,  PERCODAN, and MACRODANTIN.   PAST MEDICAL HISTORY:  Includes hypothyroidism, chronic pain syndrome,  fibromyalgia, degenerative joint disease of the circle cervical spine.  History of PE, many years ago.  History of breast cancer.  History of  IVC filter placement, history of SVT.  She has had breast implant with  rupture and removal in 1983.  Cesarean section.  She is status post  hysterectomy, status post fusion of the cervical spine.  She is status  post colectomy for chronic constipation.  Revision of breast implants.  Breast cancer in 2006, for which she underwent surgery.  She is status  post cholecystectomy.  She had ablation for SVT x3.   SOCIAL HISTORY:  Lives in [place] with her husband, unemployed.  No  smoking.  Occasional wine intake.  No illicit drug use.   FAMILY HISTORY:  Positive for macular degeneration, diabetes, and  elevated triglycerides.   REVIEW OF SYSTEMS:  GENERAL:  System positive for fatigue.  HEENT: Still  positive for headaches occasionally as a result of the fall.  CARDIOVASCULAR:  Unremarkable.  RESPIRATORY:  Unremarkable.  GI: As in  HPI.  GU: Unremarkable.  NEUROLOGIC:  Unremarkable.  PSYCHIATRIC:  Unremarkable.  DERMATOLOGIC:  Unremarkable.  MUSCULOSKELETAL:  Unremarkable at this time.  Other systems unremarkable.   PHYSICAL EXAMINATION:  VITAL SIGNS:  Temperature 97.4, blood pressure  initially was 106/60.  Last reading is 89/60s.  Heart rate 88,  respiratory rate 18, saturation 98% on room air.  GENERAL:  An overweight white female in no distress.  HEENT: She still has bruising from a recent fall.  Otherwise no pallor,  no icterus.  Oral mucous membranes moist.  No oral lesions are noted.  Head  is otherwise normocephalic.  NECK: Soft and supple.  No thyromegaly is appreciated.  No  lymphadenopathy is noted.  LUNGS:  Clear to auscultation bilaterally.  No wheezing, rales or  rhonchi.  CARDIOVASCULAR:  Normal.  Regular.  No murmurs appreciated.  No S3-S4.  No rubs.  No bruits.  Abdomen is soft.  Tenderness in the lower quadrants are present.  No  rebound rigidity or guarding is noted.  GU: Deferred.  The abdomen does not reveal any masses well.  MUSCULOSKELETAL:  Exam otherwise unremarkable.  NEUROLOGICALLY:  She is alert, oriented x3.  No focal neurological  deficits are present.   LAB DATA:  CBC is unremarkable except for a platelet count of 133.  BMET  is unremarkable.   She has had the following imaging studies:  CT of the abdomen and pelvis  which revealed thickening of the colon wall and colonic stases.  Could  represent constipation and colitis.   She had a CT head which showed no evidence for intracranial trauma or  acute findings.   Abdominal films showed extensive postsurgical changes with no other  acute findings.   ASSESSMENT:  This is a 50 year old Caucasian female who presents to [place]  after recent discharge with abdominal pain and cramping.  I have a  suspicion that this is a form of ischemic colitis because of recent low  blood pressures.  Her white count is normal.  She is afebrile.  She  continues to be hypotensive, and now I am starting to think there are  other reasons for her low blood pressure.  Last time we were thinking  maybe she was dehydrated along with the fact that she was on clonidine.  She is also on Seroquel, which can also cause significant hypotension,  and so we will go ahead and discontinue that as well.   PLAN:  1. Colitis, likely ischemic.  We will consult GI.  We will put on      Cipro Flagyl just to cover infectious process.  We will send stool      studies and support blood pressure.  2. Hypotension:  Give her IV fluid bolus.   Hold her Seroquel.  Apply      TED stockings.  If these interventions do not work, she may require      specific treatment for orthostatic hypotension in the home of      Florinef.  3. Hypothyroidism.  Continue with Levoxyl.  4. Recent facial bruising  and corneal abrasion appears to be      improving.  5. Deep venous thrombosis prophylaxis with enoxaparin.  6. She is a full code.   Further management decisions will depend on results of further testing  and patient's response to treatment.      Osvaldo Shipper, MD  Electronically Signed     GK/MEDQ  D:  08/16/2009  T:  08/16/2009  Job:  [id number]   cc:   Milus Mallick. Lodema Hong, M.D.  Fax: [phone]

## 2011-05-12 NOTE — Letter (Signed)
May 27, 2007    Barbara [name] [name]. Barbara [name] [name], M.D.  [address]  [place], [place] [address]   RE:  Barbara [name], [name]  MRN:  [id number]  /  DOB:  [birth year]   Dear Barbara [name] [name]:   Barbara [name] [name]' primary electrophysiologist is Dr. Graciela [name].  Barbara [name] [name] was  seen in the office today at Dr. Odessa [name] request.  As you know, she has a  longstanding history of intermittent chest pain.  She was evaluated in  the emergency department 2 days ago, at which time all studies were  negative.  She previously underwent a stress nuclear study in 2004  followed by a cardiac catheterization.  Results of both studies were  normal.   As you know, this nice woman has had a complex and fairly tragic medical  history including DVT, mastectomy 2 years ago for breast carcinoma and a  difficult to control supraventricular tachycardia that required 3  inpatient procedures.  Nonetheless, she is doing fairly well at the  present time.  She also has a history of multiple ovarian cysts, which  are improved with medical therapy.  She is undergoing a series of  surgical procedures at [facility] for breast reconstruction, but has taken  a pause in these.  She describes multiple problems with each surgery,  including an arrest.  She previously was maintained on warfarin, but  has been off this in anticipation of various surgical procedures.  A  filter was placed in her IVC 2 years ago when warfarin was discontinued;  nonetheless, her hematologist has recommended that she resume as well as  her Careers adviser.   Unfortunately, the patient's chart is not available to me.  I have only  fragmentary records, but she has an excellent grasp of the medical  issues in her care.   Her chest pain is described as classic ischemic chest discomfort  occurring in the anterior chest and radiating to the neck and arm,  associated with dyspnea.  She has used isosorbide tablets in the past  with questionable results.  She took one of these 2 days ago  with no  benefit, prompting her emergency department visit.  Unfortunately,  nitrates exacerbate her migraine headaches.   On exam, a well-developed, overweight woman in no acute distress.  Weight is 205, blood pressure 130/70, heart rate 100 and regular.  NECK:  No jugular venous distention; normal carotid upstrokes without  bruits.  HEENT:  Anicteric sclera; EOMs full; normal oral mucosa.  LUNGS:  Clear.  CARDIAC:  Normal first and second heart sounds; modest systolic ejection  murmur.  ABDOMEN:  Soft and nontender; no organomegaly.  EXTREMITIES:  No edema.   EKG:  Sinus tachycardia; otherwise normal.   IMPRESSION:  Barbara [name] [name] has chest discomfort that is of a similar  quality to what she experienced at the time of a negative  catheterization.  There is no reason to think she has developed coronary  disease in the intervening 4 years.  She may benefit from a calcium  channel antagonist, which also might have a positive effect on her  headaches.  We will start amlodipine at a dose of 2.5 mg daily.  She is  given sublingual nitroglycerin to use as a backup.  I will reassess this  nice woman in 2 months.  My inclination is for her not to restart  warfarin, as she will be taking this drug for decades if she is  committed to it indefinitely.  Although her DVT occurred 4  months after  her ablation procedure, I do not think it impossible that she suffered  vascular injury at that time that led to thromboembolism.  She also has  a [place] filter in, which is not a fail safe device, but provides at  least an extra margin of safety.    Sincerely,      Barbara Friends. Dietrich Pates, MD, [facility]  Electronically Signed    RMR/MedQ  DD: 05/27/2007  DT: 05/27/2007  Job #: [id number]

## 2011-05-12 NOTE — Discharge Summary (Signed)
[name], [name]               ACCOUNT NO.:  [id number]   MEDICAL RECORD NO.:  [id number]          PATIENT TYPE:  INP   LOCATION:  A212                          FACILITY:  [facility]   PHYSICIAN:  Melissa L. Ladona Ridgel, MD  DATE OF BIRTH:  04/15/1961   DATE OF ADMISSION:  08/15/2009  DATE OF DISCHARGE:  08/23/2010LH                               DISCHARGE SUMMARY   DISCHARGE DIAGNOSES:  1. Abdominal pain secondary to colitis felt to be secondary to      hypotension.  The findings were mild to moderate on CT scan, and      the patient was treated with IV fluids and bowel rest.  At the time      of her discharge, the patient was not orthostatic. She was able to      ambulate around the room without difficulty.  She does maintain low      blood pressures at baseline, and at this time, since she is      asymptomatic, I still have elected not to treat her with midodrine      or Florinef.  Further evaluation can be made if she continues to      have difficulty with symptomatic hypotension.  We have discontinued      Seroquel which was felt to be possibly causing the symptoms, and      the patient seemed to tolerate this reasonably well.  2..  Cluster-like headaches.  We continually, during the course the  hospital stay, attempted to avoid opiate therapy for this patient.  We  trialed O2 and lidocaine patches which did help somewhat but not  completely. The patient was able to bear up under her headache without  advancing towards any opiate medications.  1. Hypothyroidism.  Her TSH remained slightly low during last      admission and had released her on a slightly lower dose of      Synthroid.  I will recommend that she discharge to home on the      lower dose and recheck with Dr. Lodema Hong in the next couple of weeks      to see if indeed we have targeted her thyroid with the appropriate      dosing.  2. Depression.  I have continued to encourage the patient to seek      formal psychiatric  counseling.  At this time, she remains without      suicidal ideation and without homicidal ideation.  She is just      really very overwhelmed. She states that she does have an      appointment with Dr. Kieth Brightly and would prefer to seek that care      as an outpatient.  I did, however, offer inpatient evaluation if      she so desired. She did elect to be seen as an outpatient.  3. Nausea with vomiting secondary to the colitis.  The patient was      seen and evaluated by GI.  We treated her with Flagyl and Cipro.      She will be discharged  to home on oral Cipro and Flagyl to complete      a full course of antibiotic therapy.  She can follow up and has an      appointment to follow up with GI at [facility], Dr. Bing Plume.  Please note      that C. difficile cultures were evaluated and were completely      negative.  This is why it is felt that this is likely secondary to      hypotension.  Recommendations were to discontinue Bentyl, add      MiraLax and FloraQ in order to stabilize the bowel regime, and then      the patient was ultimately started Amitiza 8 mcg twice daily.  4. Corneal abrasion.  The patient is completing a course of Zymar with      she has at home and will complete that until she sees her eye      doctor.   DISCHARGE MEDICATIONS:  1. Levothyroxin 100 mcg daily.  2. Flexeril 10 mg daily.  3. Xanax 0.5 mg every 6 hours as needed.  4. Lovastatin 40 mg at bedtime.  5. Cymbalta 12 mg once daily.  6. MiraLax 17 grams daily.  7. Trazodone, the dose of which has been unclear but may continue as      per the physician who is prescribing this.  8. Colace was discontinued.  9. Seroquel was discontinued.  10.Dicyclomine was discontinued.  11.The patient can use a limited doses of ibuprofen over-the-counter      as directed.  12.Nexium 40 mg twice daily.  13.FloraQ 1 tablet daily.  14.Amitiza 8 mcg twice daily.  15.Zymar 1 drop every 6 hours into the right eye.  16.Cipro 500 mg  twice daily.  17.Flagyl 500 mg every 8 hours to complete a full 14-day course of      antibiotic therapy.   CONSULTATIONS:  GI. Please see the above recommendations.   HOSPITAL COURSE:  The patient is a 50 year old white female who had  recently been hospitalized with a status post a fall.  She was found at  that time to be orthostatic likely secondary to medications that she was  using for detoxification from opiates.  The patient was discharged to  home in reasonable health during that admission but returned within 24  hours with significant abdominal pain, nausea and vomiting.  CT scan of  the abdomen revealed mild colitis felt to be possibly ischemic and  related to colonic inertia.  The patient was seen and evaluated by GI.  Recommendations were to discontinue Bentyl, hydrate her, bowel rest, and  the patient was placed on IV Cipro and Flagyl until adequate stool  cultures could return.  The patient slowly returns to reasonable bowel  function and, in fact, on the day of discharge had been eating a  [employer] hamburger.  She is up and about without symptoms related to  her moderately low blood pressure. We had discontinued Seroquel which  again seemed to assist in her lowish blood pressures, but her range of  pressures were 99 to 117 systolically, and her orthostatics remained  without symptoms.  In light of the fact that she is asymptomatic,  wandering around her room, talking on the phone with the current blood  pressures, I have elected not to add Florinef or midodrine.  Should she  return with complications related to her orthostasis, I would recommend  cosyntropin stimulation testing as well as a cardiology evaluation, but  at  this time we have obvious reasons that could be contributing to her  symptoms.  I am concerned that, with the addition of other medications,  we may cause more harm than good.   On the day of discharge, the patient again was clinically doing well.   She remained guardedly optimistic about the stressors that she was  facing at home.  We had a conversation at length about the patient's  husband losing his job recently and concerns about how to pay the bills  as well as stressors related to her daughter.  The patient was offered  the opportunity to seek counseling regarding the stressors which she  stated she would prefer to do as an outpatient.   DISCHARGE PHYSICAL EXAMINATION:  VITAL SIGNS:  Temperature was 98, blood  pressure 99/70, pulse 83, respirations 18, saturation  91-97% on room  air.  GENERAL: This is a moderately obese white female in no acute distress.  HEENT:  She is normocephalic, atraumatic.  Pupils equal, round, and  reactive to light.  Extraocular muscles appear to be intact.  She has  anicteric sclerae.  Examination of nose reveals septum midline without  discharge.  Examination of mouth reveals no oral lesions or lip lesions.  NECK:  Examination of the neck reveals no JVD, no lymph nodes, no  carotid bruits.  CHEST:  Clear to auscultation.  No rhonchi, rales, or wheezes.  CARDIOVASCULAR:  Regular rate and rhythm, positive S1 and S2.  No S3 or  S4.  No murmurs, rubs or gallops.  ABDOMEN:  Soft, nontender, nondistended with positive bowel sounds.  No  hepatosplenomegaly, no guarding or rebound, no bruits      Melissa L. Ladona Ridgel, MD  Electronically Signed     MLT/MEDQ  D:  08/26/2009  T:  08/26/2009  Job:  [id number]
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
## 2011-05-12 NOTE — H&P (Signed)
NAME:  [name], [name]               ACCOUNT NO.:  [id number]   MEDICAL RECORD NO.:  [id number]          PATIENT TYPE:  INP   LOCATION:  A202                          FACILITY:  [facility]   PHYSICIAN:  Osvaldo Shipper, MD     DATE OF BIRTH:  June 25, 1961   DATE OF ADMISSION:  08/11/2009  DATE OF DISCHARGE:  LH                              HISTORY & PHYSICAL   PRIMARY CARE PHYSICIAN:  Dr. Syliva Overman.   It should be noted that the patient was discharged after a 24 day stay  at [facility].  She was discharged on August 2.  She underwent  detoxification from OxyContin.  Prior to the detoxification, she was  evaluated for narcotic bowel syndrome.   ADMISSION DIAGNOSIS:  1. Syncope likely secondary to orthostasis.  2. Hypotension secondary to clonidine.  3. Facial trauma with no evidence for fracture.  4. Right corneal abrasion  5. Extensive bruising over the right side of the face.  6. History of supraventricular tachycardia status post ablation.  7. History of thromboembolic events in the past status post inferior      vena cava filter placement.   CHIEF COMPLAINT:  Fall and pain.   HISTORY OF PRESENT ILLNESS:  The patient is a 50 year old Caucasian  female who was in her usual state of health until she came back from [facility] on August 2.  Since then, she has been feeling very  lightheaded and dizzy most of the time.  She has had multiple episodes  of syncope.  She feels like most of the time that the room spinning  around her.  More most of particularly last yesterday evening around  10:30, she was in the bathroom and when she stood up, she felt dizzy,  lightheaded and then fell on the floor and lost consciousness.  She fell  on  her face essentially towards the right side of the face.  She woke  up with excruciating pain.  The pain is 10/10 in intensity.  She tells  me that the pain is all over with but most particularly in the right  eye.  Apparently she was trying  to did get her contacts out of her eyes  and may have injured her eye at that time.  She had some chest pain  earlier today but none currently.  Denies any shortness of breath.  Did  have some nausea but no emesis.   MEDICATIONS AT HOME:  This list is available from the ED sheet.  She had  gotten a list to the ED this morning and they recorded all of her  medications.  She did not get the list back during this visit.  She is  on the following:  1. Levoxyl 175 mcg once a day.  2. Flexeril 10 mg once a day.  3. Xanax 0.5 mg four times a day.  4. Lovastatin 40 mg at bedtime.  5. Cymbalta 120 mg once a day.  6. MiraLax 17 grams once a day.  7. Trazodone unknown dose at bedtime.  8. Promethazine as needed.  9.  Clonazepam 2 mg two times a day.  10.Colace 100 mg twice a day.  11.Seroquel 200 mg once a day.  12.Dicyclomine 10 mg q.6 h.  13.Ibuprofen 800 mg as needed.  14.Nexium 40 mg twice a day.  15.Clonidine patch 0.1 mg, this was taken off 2 days ago by the      patient herself.   ALLERGIES:  Biaxin, Cleocin, codeine, Keflex, Macrodantin, penicillin,  Percocet and Percodan.   PAST SURGICAL HISTORY:  1. Breast implants with rupture and removal in 1983.  2. 1986 cesarean section.  3. 1987 status post hysterectomy.  4. 2003 fusion of cervical spine.  5. 2004 status post colectomy for chronic constipation.  6. In 2000, she had revision of her breast implants.  7. In 2006, she was diagnosed with breast cancer of the left breast      and underwent surgery.  8. She is status post cholecystectomy as well.  9. She had ablation for SVT x3.   PAST MEDICAL HISTORY:  Hypothyroidism, chronic pain syndrome,  fibromyalgia, degenerative joint disease of the cervical spine, history  of PE many years ago, in 19 97 specifically and at that time she was on  Coumadin.  During the breast cancer procedure, she had actually had a  IVC filter placed in 2006 and since then she has not been on  Coumadin.  There is a documentation of A fib, although I think it was mostly SVT.  There is a documentation of atrial flutter by the cardiologist.   SOCIAL HISTORY:  Lives in [place] with her husband.  Does not work.  No smoking.  Occasional wine intake.  No illicit drug use.   FAMILY HISTORY:  Positive for diabetes, macular degeneration, elevated  triglycerides.   REVIEW OF SYSTEMS:  GENERAL:  Positive for weakness, malaise.  HEENT: Unremarkable.  CARDIOVASCULAR:  Unremarkable.  RESPIRATORY:  Unremarkable.  GI: As in HPI.  GU: Unremarkable.  NEUROLOGIC:  Unremarkable.  PSYCHIATRIC:  Unremarkable.  DERMATOLOGICAL:  As in HPI.  MUSCULOSKELETAL:  As in HPI.  Other systems unremarkable.   PHYSICAL EXAMINATION:  VITAL SIGNS: Temperature 98.3, blood pressure  99/58 initially with a heart rate of 99, respiratory rate 20, saturation  99% on room air.  Blood pressure improved to 103/70, heart rate was 87.  Orthostatics were not done because her blood pressure was low.  GENERAL:  Overweight obese white female in moderate to severe discomfort  because of pain in the right face.  HEENT:  There is bruising noted on the right side of her face.  There is  also some bruising over her nose and towards left side.  Her eyelid is  completely Bruce purple.  Eyes are shut.  It is easy to open and  visualize her eye.  Corneal abrasion was noted by the ED physician.  Nose examination of the bilateral nostrils did not reveal any active  bleeding.  Some erythema is noted.  Oral mucous membrane is slightly  dry.  No oral lesions are noted.  Head is otherwise normocephalic.  NECK:  Soft and supple.  No thyromegaly is appreciated.  No  lymphadenopathy is noted.  LUNGS:  Clear to auscultation bilaterally.  No wheezing, rales or  rhonchi.  CARDIOVASCULAR:  S1, S2 is normal, regular.  No S3-S4.  No rubs.  No  bruits.  No pedal edema is present.  ABDOMEN:  Multiple scars from previous surgery and there  is tenderness  in the lower left quadrant which is chronic per  the patient.  No rebound  rigidity or guarding.  Bowel sounds present.  No masses or organomegaly  is appreciated.  GU: Examination deferred.  MUSCULOSKELETAL:  Exam except for under HEENT, otherwise unremarkable.  NEUROLOGICALLY:  She is alert, oriented x3.  No focal neurological  deficits are present.   LABORATORY DATA:  Labs done earlier include a normal CBC, normal BUN, a  negative pregnancy, urine drug screen positive for benzodiazepine,  alcohol level less than 5.  UA shows specific gravity greater than  1.030, trace protein, trace leukocytes, 0-2 WBCs, few bacteria.  Mucous  was present.   Imaging studies:  She actually had a CT maxillofacial as well as head  which did not show any fractures.  No intracranial pathology.  No facial  bone injury.  This was again reviewed by the ED physician with the  radiologist today to rule out any kind of ethmoidal  fractures and this  was indeed ruled out.   ASSESSMENT:  This is a 50 year old Caucasian female who presents after a  fall and syncope sustaining significant bruising to the right side of  her face along with a corneal abrasion on the right side.  This syncope  was most likely the result of orthostatic hypotension.  The reason for  the orthostatic hypotension is likely use of clonidine.  She was on the  clonidine for detox purpose not for hypertensive purpose.   PLAN:  1. Syncope.  We will give her IV fluids through the night and give her      a bolus right now.  We will check her orthostatics tomorrow morning      if she is no longer hypotensive in the supine position.  PT      evaluation will be obtained.  2. Right facial bruising and a corneal abrasion.  Gatifloxacin eye      drops will be utilized.  Pain medication intravenously will be      provided as she is in significant pain at this time.  3. Continue with the most of her other medications except for       clonazepam along with Xanax which can cause significant somnolence,      so we will hold off on that for now.  4. DVT prophylaxis with Lovenox.  Coags will be checked prior to      giving the Lovenox.  .  5. She is a full code.   She was telling me about significant nasal drainage.  I was concerned  about rhinorrhea secondary to CSF leak.  However, I discussed this with  Dr. Rubin Payor ED physician and he again discussed the CT scan with the  radiologist  and they did not feel that we needed to repeat the CT.  The rhinorrhea  could be explained by significant drainage from the eyes which could  obviously go down through the nostril.  So, at this point we will hold  off on further testing, admit her to the hospital and see how she  responds to our treatment.      Osvaldo Shipper, MD  Electronically Signed     GK/MEDQ  D:  08/11/2009  T:  08/11/2009  Job:  [id number]   cc:   Milus Mallick. Lodema Hong, M.D.  Fax: [phone]

## 2011-05-12 NOTE — Group Therapy Note (Signed)
NAMEGAE, BIHL               ACCOUNT NO.:  [id number]   MEDICAL RECORD NO.:  [id number]          PATIENT TYPE:  INP   LOCATION:  A212                          FACILITY:  [facility]   PHYSICIAN:  Melissa L. Ladona Ridgel, MD  DATE OF BIRTH:  15-Sep-1961   DATE OF PROCEDURE:  08/17/2009  DATE OF DISCHARGE:  08/19/2009                                 PROGRESS NOTE   Please see the accompanying written note in the chart and note the  following.  Subjectively, the patient complains of headache and  continued fuzzy vision on the right side where she has a corneal  abrasion.  She also complains of some nausea when getting up.  The  patient shares with me that her spouse just lost his job and that she is  very anxious and distraught over this issue.  She requests that someone  come and talk with her about her stressors.  Otherwise, she has had no  vomiting and her abdomen is improving slowly but is still tender.   PHYSICAL EXAMINATION:  VITAL SIGNS:  Temperature 97.6, blood pressure is  99/65 to 121/60, pulse 85, respirations 20, saturation 97% GENERAL:  This is a moderately obese white female, who is in mild distress  secondary to a headache.  She is lying in a darkened room and states  that she gets cluster migraines.  She also reports some significant  stressors in that her husband was just laid off from work.  Otherwise,  she is in no acute distress.  HEENT:  She is normocephalic, atraumatic.  Pupils equal, round, and  reactive to light.  Extraocular muscles are intact.  Mucous membranes  are moist.  NECK:  Supple.  There is no JVD, no lymph nodes, no carotid bruits.  CHEST:  Clear to auscultation.  There is no rhonchi, rales, or wheezes.  CARDIOVASCULAR:  Regular rate, rhythm, positive S1 S2, no S3 S4, no  murmurs, rubs, or gallops.  ABDOMEN:  Soft, minimally tender in a diffuse pattern without focality  improved since yesterday.  There are positive bowel sounds.  EXTREMITIES:  No clubbing,  cyanosis, or edema.  NEUROLOGICAL:  She is awake, alert, and oriented.  Cranial nerves II-XII  are intact.  Power is 5/5.  DTRs are 2+.  Plantars are downgoing.   LABORATORY DATA:  Her white count was 5.2 with a hemoglobin of 12.4,  hematocrit 35.9, and platelets of 118.  Her sodium is 141, potassium  3.5, chloride 112, CO2 26, BUN 8, creatinine 0.76, and a glucose of 100.  Lipase was 22, TSH 0.181, T3 was 92.6, and a T4 of 1.12.   ASSESSMENT AND PLAN:  This is a 50 year old white female, who returns to  the hospital with abdominal pain and cramping status post a stay for  hypotension with a fall.  [name] abdominal pain is doing better  this p.m.  A computerized tomography scan showed an area of colitis,  which was mild to moderate, and may be secondary to low blood pressures  versus an infectious process.  The patient remains on antibiotic therapy  for  that.  For now, she has no headache and she feels like this is a  cluster headache.  1. Cluster headache:  We are avoiding opiates, I will add O2, and try      a low lidocaine patch to see if that helps.  She also can have      Tylenol.  2. Nausea when up and about:  We will check orthostatics and if      positive consider adding Florinef.  If she remains symptomatic, we      may consider checking a random cortisol in the morning but at this      time she has no reason for adrenal insufficiency.  3. Hypothyroidism:  T4 is normal, but her TSH remains decreased.      Therefore, I have decreased her Synthroid and will ask that she be      rechecked as an outpatient.  4. Colitis:  We will continue to try to maintain adequate blood      pressure and avoid increasing her opiates.  5. Depression:  Please note we have discontinued her Seroquel in an      attempt to impact on her low blood pressures.   Total time on this case was 20 minutes.      Melissa L. Ladona Ridgel, MD  Electronically Signed     MLT/MEDQ  D:  08/26/2009  T:   08/26/2009  Job:  [id number]

## 2011-05-12 NOTE — Op Note (Signed)
NAME:  [name], [name]               ACCOUNT NO.:  [id number]   MEDICAL RECORD NO.:  [id number]          PATIENT TYPE:  AMB   LOCATION:  DAY                           FACILITY:  [facility]   PHYSICIAN:  R. Roetta Sessions, M.D. DATE OF BIRTH:  Nov 11, 1961   DATE OF PROCEDURE:  08/18/2007  DATE OF DISCHARGE:  08/18/2007                               OPERATIVE REPORT   PROCEDURE:  Small-bowel Givens capsule study.   INDICATIONS FOR PROCEDURE:  Mr. Yiu is a 50 year old female with a  history of breast cancer status post total proctocolectomy for colonic  inertia who was referred through the courtesy of Dr. Earma Reading in  [place] for further evaluation of iron-deficiency anemia and is requiring  parental iron. She has a history of chronic GERD well controlled on  Nexium. She had EGD by Dr. Jena Gauss on July 13, 2007 which showed distal  esophageal ulcers which were not typical for GERD and were benign when  biopsied. She also has small hiatal hernia. She also has retained  gastric contents and drug-induced gastroparesis. She underwent 2  __________  capsule studies to verify patency of the small bowel. It was  felt that those capsules did not appear to be lodged in the small bowel  prior to this test. She had an attempted small-bowel Givens capsule  study on August 05, 2007 which was incomplete due to retained gastric  food contents and the fact that the capsule did not appear to have left  the stomach after 8 hours.   FINDINGS:  The capsule was introduced into the stomach and small bowel  via EGD (see Dr. Luvenia Starch procedure note from today's date). At 7 minutes  and 26 seconds, she was noted to have active bright red bleeding of the  hypopharynx. This persisted to 8 minutes and 18 seconds. The first  gastric image was at 8 minutes and 36 seconds, first duodenal image at 8  minutes and 59 seconds. At 55 minutes and 22 seconds, a large ulcerated  exophytic lesion appeared between 9 and 11 o'clock  and persisted  throughout the images until 56 minutes and 45 seconds. Throughout the  mid to distal small bowel, she appeared to have a bluish discoloration  of the mucosa along with a pronounced hyperemic state and possibly some  edema. Please see images on capsule report. At 2 hours and 59 minutes  and 11 seconds, she was noted to have a round phlebectasia. Throughout  the 8 hour study, the capsule did not appear to exit the small bowel.   SUMMARY AND RECOMMENDATIONS:  This is an abnormal Givens capsule study.  Several findings initially, she appears to have mild scope trauma with  hypopharyngeal bleeding which is minimal. Givens capsule had to be  placed due to drug-induced gastroparesis. She was found to have an  ulcerated exophytic lesion in the proximal small bowel which is going to  require further evaluation. She has delayed small-bowel transient time,  and the capsule does not appear to exit the small bowel. I have  discussed these findings with Dr. Jena Gauss as well as with  Ms. Guardado. She  is going to need further evaluation with small-bowel CT enterography,  and we will set this up as soon as possible. She tells me she has a CT  planned for Thursday at [facility]  through her  surgeons, and I have asked her to let him know that she is going to need  this study done here to see if he would like to proceed with his study.  Nonetheless, she is going to need a further direct evaluation of the  small bowel. She was also mailed gastroparesis literature.      Lorenza Burton, N.P.      Jonathon Bellows, M.D.  Electronically Signed    KJ/MEDQ  D:  08/24/2007  T:  08/24/2007  Job:  [id number]   cc:   Lodema Hong, M.D.  [place], [place]   Boris M. Darovsky, M.D.  Fax: [phone]

## 2011-05-15 NOTE — H&P (Signed)
NAME:  [name], [name]                         ACCOUNT NO.:  [id number]   MEDICAL RECORD NO.:  [id number]                   PATIENT TYPE:  INP   LOCATION:  A225                                 FACILITY:  [facility]   PHYSICIAN:  Dirk Dress. Katrinka Blazing, M.D.                DATE OF BIRTH:  1961-03-28   DATE OF ADMISSION:  08/02/2002  DATE OF DISCHARGE:                                HISTORY & PHYSICAL   HISTORY OF PRESENT ILLNESS:  This 50 year old female with a history of  severe uncontrolled constipation.  The patient has had severe constipation  for many years.  She has been thoroughly evaluated and has been deemed to  have severe colonic inertia.  This is complicated by prolonged narcotic use.  She is at the point now where she has a bowel movement every one to two  weeks.  She has been tried on multiple softeners, MiraLax, lactulose,  Kristalose, and all other over-the-counter diarrheal agents without  improving it.  She now has extreme pain.  She gets to the point where she  has to strain for prolonged periods of time with bowel movements to the  point that she nearly passes out.  She has been evaluated by Dr. Karilyn Cota and  by the GI service with the [facility] group and they each have felt that she  would need to have a subtotal colectomy.  Barium enema shows only a  redundant colon.  Colonoscopy shows a redundant colon with extensive  melanosis coli.  The patient also has developed post prandial abdominal pain  with nausea and occasional episodes of vomiting with tenderness in  epigastric and right upper quadrant.  The evaluation of this reveals that  she has chronic cholecystitis with decreased gallbladder ejection fraction.  She is admitted for bowel prep, switching from Coumadin to heparin in  preparation for subtotal colectomy and cholecystectomy.   PAST MEDICAL HISTORY:  The patient has multiple chronic illnesses which  probably are contributing to her colonic problem.  She has chronic  fatigue  syndrome, fibromyalgia, severe migraine headaches of the cluster-type,  gastroesophageal reflux disease, severe cervical degenerative disk disease  status post anterior diskectomy with fusion, severe lumbar disk disease,  hypothyroidism, history of DVT with pulmonary embolism, and a history of  paroxysmal atrial fibrillation status post radiofrequency ablation in 1987  and 1999, hypertriglyceridemia.   CURRENT MEDICATIONS:  1. Senokot 8 tablets a day.  2. Prevacid 30 mg twice a day.  3. Coumadin 7.5 mg two times a week and 5 mg five times a week.  This was     discontinued on Monday.  4. Synthroid 150 mcg q.d.  5. Peri-Colace 4 tabs q.h.s.  6. Xanax 0.5 mg t.i.d.  7. OxyContin 80 mg t.i.d.  8. TriCor 160 mg q.d.  9. Caltrate 600 mg q.d.  10.      Flexeril 10 mg q.d.  11.  Mineral oil enemas p.r.n.  12.      Fleets enemas p.r.n.   PAST SURGICAL HISTORY:  Total abdominal hysterectomy for endometriosis,  appendectomy, breast augmentation 1983, and removal of breast implants 1993  after a rupture, anterior diskectomy at C4-5 with fusion, C-section, and  anal fissure surgery.   FAMILY HISTORY:  Positive for diabetes, atherosclerotic heart disease,  stroke, and colon cancer.   SOCIAL HISTORY:  She is married, disabled.  She does not drink, smoke, or  use illicit drugs.   ALLERGIES:  PENICILLIN, KEFLEX, MACRODANTIN, CLEOCIN, CODEINE, and TEQUIN.   REVIEW OF SYSTEMS:  Full review of systems on this patient shows that she  has positive review of system in every system.  This complicates the  understanding of her overall illness quite significantly.   PHYSICAL EXAMINATION:  GENERAL:  She is a pleasant-looking young female,  mildly obese, in no acute distress.  VITAL SIGNS:  Blood pressure 160/80, pulse 80, respirations 20, temperature  98.3.  HEENT:  Unremarkable.  There is no jaundice.  Sclerae and conjunctivae are  clear, pupils equal, round, and reactive to light.   Extraocular movements  are intact.  Oropharynx is normal.  Teeth are in good repair.  Gag reflex is  intact.  Tongue and uvula are in the midline.  Tympanic membranes are  normal.  NECK:  No adenopathy, thyromegaly, jugular vein distention, or bruits.  Decreased range of motion from previous surgery.  Healed scar, right lower  anterior.  Mild tenderness over the scar.  CHEST:  Clear to auscultation with good air movement.  HEART:  Regular rate and rhythm.  No murmur, gallop, or rub.  Normal S1 and  S2.  ABDOMEN:  Mildly distended but soft.  Palpable subcutaneous nodule, left  lower quadrant, probably from previous Lovenox injection.  She has mild  suprapubic tenderness and left lower quadrant tenderness.  She has good  active bowel sounds.  No palpable organomegaly.  RECTAL:  Good sphincter tone with good relaxation and tightening clinically.  The rectum was filled with stool, the stool was guaiac-negative.  EXTREMITIES:  No cyanosis, clubbing, or edema.  No venous cords.  No diffuse  swelling of any kind.  No venous stasis changes.  No ulceration.  Hips,  knees, and ankle joints appear unremarkable.  It should be noted that  although the patient carries a diagnosis of fibromyalgia, I could not  illicit any trigger points during my examination.  NEUROLOGICAL:  She is quite anxious but she is alert and fully oriented.  Cranial nerves II-XII intact.  Deep tendon reflexes intact.  There is no  motor, sensory, or cerebellar deficits.  Gait and stance are normal.   IMPRESSION:  1. Intractable constipation due to colonic inertia, unresponsive to medical     management.  2. Chronic cholecystitis with recurrent severe biliary colic.  3. History of pulmonary embolus, on chronic Coumadin therapy, probably due     to Leiden V mutation but this is not adequately confirmed by me. 4. Severe degenerative disk disease involving cervical and lumbar spines.  5. Chronic high-dose narcotic use.  6.  Depression with anxiety.  7. Fibromyalgia.  8. History of paroxysmal atrial fibrillation, status post radiofrequency     ablation, 1987 and 1997.  9. History of cluster-type migraine headaches.  10.      Gastroesophageal reflux disease.  11.      Hypothyroidism.  12.      Chronic fatigue syndrome.  13.  Hypertriglyceridemia.    PLAN:  The patient with have an in-house bowel prep.  She will be switched  from Coumadin to heparin and the heparin will stopped six hours  preoperatively.  It will reinstituted between 10-12 hours postoperatively.  She will have subtotal colectomy and cholecystectomy.  Drs. Fisher and  Lodema Hong have been asked to consult in the medical management of this  patient.                                               Dirk Dress. Katrinka Blazing, M.D.    LCS/MEDQ  D:  08/03/2002  T:  08/03/2002  Job:  [id number]

## 2011-05-15 NOTE — Discharge Summary (Signed)
NAME:  LAKODA, RASKE NO.:  [id number]   MEDICAL RECORD NO.:  [id number]                   PATIENT TYPE:   LOCATION:                                       FACILITY:   PHYSICIAN:  Dirk Dress. Katrinka Blazing, M.D.                DATE OF BIRTH:   DATE OF ADMISSION:  08/02/2002  DATE OF DISCHARGE:  08/15/2002                                 DISCHARGE SUMMARY   DISCHARGE DIAGNOSES:  1. Intractable constipation due to colonic inertia.  2. Chronic cholecystitis with severe biliary colic.  3. History of pulmonary embolism on chronic Coumadin therapy.  4. Severe degenerative disk disease involving the cervical and lumbar spine.  5. Depression with anxiety.  6. Fibromyalgia.  7. Gastroesophageal reflux disease.  8. Hypothyroidism.  9. Chronic fatigue syndrome.  10.      Postoperative bleeding.  11.      Anemia.   SPECIAL PROCEDURE:  Total abdominal colectomy and cholecystitis on August 04, 2002.   DISPOSITION:  The patient is discharged home in stable and satisfactory  condition.   DISCHARGE MEDICATIONS:  1. Urispas 1 q.8h.  2. Reglan 5 mg a.c.  3. Prevacid 30 mg b.i.d.  4. Xanax 0.5 mg t.i.d.  5. Synthroid 150 mcg q.d.  6. Pyridium 200 mg t.i.d.  7. Phenergan 25 mg q.4h. p.r.n.  8. Dilaudid 8 mg q.4h.  9. Potassium chloride 20 mEq b.i.d.  10.      OxyContin 80 mg t.i.d.  11.      Tricor 160 mg q.d.  12.      Caltrate 600 mg q.d.  13.      Flexeril 10 mg q.d.  14.      Coumadin 5 mg q.d.  15.      Levsin 0.125 mg a.c. and h.s.   FOLLOW UP:  The patient will have home health nurse to follow her PT and INR  and CBC and electrolytes.   SUMMARY:  A 50 year old female with a history of severe uncontrolled  constipation for many years.  She had been thoroughly evaluated and deemed  to have severe colonic inertia.  This was complicated by prolonged narcotic  use.  The patient was having bowel movements every two weeks on multiple  bowel softeners in  addition to MiraLax, lactulose, and Crystallose without  improvement.  She was evaluated by Dr. Karilyn Cota and by the gastroenterology  service at [facility].  They felt that she needed to have a  colectomy.  Barium enema only showed a redundant colon.  Colonoscopy showed  a redundant colon with extensive melanosis coli.  The patient also developed  postprandial abdominal pain with nausea, vomiting, and had epigastric and  right upper quadrant tenderness.  She was felt to have chronic  cholecystitis.  She was admitted for bowel prep, to switch her from Coumadin  to heparin in preparation for subtotal  colectomy and cholecystectomy.  Other  specifics of her illness are given in the comprehensive medical note and the  admission note.   HOSPITAL COURSE:  The patient was admitted and started on bowel prep.  She  was seen by cardiology.  Bowel prep was carried out.  On August 8, she  underwent total abdominal colectomy and cholecystectomy.  Intraoperative  blood loss was about 250 cc.  She had severe pain for the first few days.  She received large-dose narcotics.  She remained stable but had some  postoperative bleeding and her hemoglobin dropped from 12.1 in the immediate  postoperative period to 7.4 on the second postoperative day.  She was  transfused to a hemoglobin of 10.  The Lovenox that she was receiving was  decreased.  Lovenox was subsequently stopped.  The patient had a prolonged  hospitalization that gradually improved.  She did not have any episodes of  hypoxemia.  She was restarted on Lovenox on August 12 and continued on the  low-dose Lovenox from that point on.  She had slow return of intestinal  activity but once she started having bowel movements she did very well.  She  initially had about five or six bowel movements a day.  Diet was rapidly  advanced and she tolerated this quite well.  After she was on a diet, she  was rapidly advanced and was subsequently discharged  home in stable  condition.                                               Dirk Dress. Katrinka Blazing, M.D.    LCS/MEDQ  D:  12/12/2002  T:  12/14/2002  Job:  [id number]

## 2011-05-15 NOTE — Op Note (Signed)
[facility]  Patient:    [name], [name]                    MRN: [id number] Proc. Date: 04/22/01 Adm. Date:  [id number] Disc. Date: [id number] Attending:  Londell Moh                           Operative Report  PREOPERATIVE DIAGNOSES:  Interstitial cystitis.  POSTOPERATIVE DIAGNOSES:  Interstitial cystitis.  PROCEDURE:  Cystoscopy, urethral calibration, hydrodistention of the bladder, Marcaine and Pyridium installation, Marcaine and Kenalog injection.  SURGEON:  Dr. Logan Bores.  ANESTHESIA:  General.  COMPLICATIONS:  None.  DRAINS:  None.  BRIEF HISTORY:  A 50 year old female is known to have interstitial cystitis. The patient previously responded very nicely to hydrodistention. Unfortunately the patient has not responded to other forms of therapy anywhere near as well as she has to this therapeutic maneuver. The patient has requested this be repeated. She will need to stay in the hospital for correction of her anticoagulation status. The patient understands the risks and benefits of the procedure and gave full and informed consent.  DESCRIPTION OF PROCEDURE:  After successful induction of general anesthesia, the patient was placed in the dorsal lithotomy position, prepped with Betadine and draped in the usual sterile fashion. Cystoscopy was performed, the bladder was carefully inspected and was free of any tumor or stones. Both ureteral orifices were normal in configuration and location. The bladder was distended to a pressure of 100 cm of water for five minutes. When the bladder was drained, the patient had classic findings of interstitial cystitis. The bladder was drained and a mixture of Marcaine and Pyridium was left within the bladder. Marcaine and Kenalog were injected periurethrally. The patient tolerated this without difficulty and will admitted to the hospital for care by the cardiologist to take care of her anticoagulation  problems. DD:  04/22/01 TD:  04/23/01 Job: [id number]

## 2011-05-15 NOTE — Op Note (Signed)
[place]. [facility]  Patient:    [name], [name] Visit Number: [id number] MRN: [id number]          Service Type: SUR Location: [id number] Attending Physician:  Jonne Ply Dictated by:   Stefani Dama, M.D. Proc. Date: 03/07/02 Admit Date:  03/07/2002                             Operative Report  PREOPERATIVE DIAGNOSIS:  C4-5 herniated nucleus pulposus plus spondylosis with right cervical radiculopathy.  PROCEDURE:  Anterior cervical diskectomy C4-5, allograft arthrodesis, Synthes fixation.  SURGEON:  Stefani Dama, M.D.  FIRST ASSISTANT:  Tanya Nones. Jeral Fruit, M.D.  ANESTHESIA:  General endotracheal.  INDICATION:  The patient is a 50 year old individual who has had significant neck, shoulder, and arm pain involving the right arm.  She has a herniated nucleus pulposus at the C4-5 level on top of spondylitic, degenerated joint. She was advised regarding surgical intervention.  The patient also had significant problems with a previous history of some cardiac ectopy and pulmonary emboli requiring anticoagulation.  She has been off her Coumadin for several days and has been on Lovenox.  At this time she will be undergoing surgery with anticoagulation to start soon after her postoperative course.  DESCRIPTION OF PROCEDURE:  The patient was brought to the operating room and placed on the operating table in supine position.  After smooth induction of general endotracheal anesthesia, she was placed in five pounds of Holter traction.  The neck was shaved, prepped with Duraprep, and draped in a sterile fashion.  A transverse incision was made on the left side of the neck and carried down through the platysma.  The plane between the sternocleidomastoid and the strap muscles was dissected bluntly until the prevertebral space was reached.  The first identifiable disk space was noted to be that of C4-5 on the radiograph.  Anterior diskectomy was  then performed, opening the anterior longitudinal ligament with a #15 blade and a considerable amount of markedly degenerated disk material was removed from the C4-5 disk space.  Large ventral osteophytes were taken down with a rongeur.  A self-retaining disk spreader was placed into the wound, and then the disk space was opened, and a combination of curettes and rongeurs was used to remove the remainder of the disk and the posterior longitudinal ligament was identified.  Dissection was carried out to either side, and bony osteophytes from the inferior margin of the body of C4 were drilled down with a high-speed air drill and a 2.3 mm dissecting tool.  The uncinate process on the right side was also taken down. The ligament was noted to be bulged back posteriorly and was thickened and redundant on the right side.  This was taken up with the 2 and 3 mm Kerrison punch and then undercutting the C5 vertebrae found that there was a small fragment of disk underneath the ligament in this region.  This was resected. The common dural tube and the L5 nerve root, particularly on the right side, were well-decompressed.  On the left side a small foraminotomy was created only.  In the end, the end plates were drilled smooth so that they were parallel, and then a 7 mm tricortical graft was placed with the cortical surface facing dorsally.  The ventral aspects were trimmed appropriately, and a 16 mm standard-size Synthes plate was affixed with four locking 4 x 4 mm screws.  A localizing radiograph identified good position of the graft. Hemostasis was then carefully meticulously obtained in the prevertebral space. All the soft tissues were checked doubly for good hemostatic control, and then the platysma was closed with 3-0 Vicryl in interrupted fashion, 3-0 Vicryl was used in the subcuticular tissue.  The patient tolerated the procedure well and was returned to the recovery room in stable  condition. Dictated by:   Stefani Dama, M.D. Attending Physician:  Jonne Ply DD:  03/07/02 TD:  03/08/02 Job: [id number]

## 2011-05-15 NOTE — Discharge Summary (Signed)
[place]. [facility]  Patient:    [name], [name]                      MRN: [id number] Adm. Date:  [id number] Disc. Date: [id number] Attending:  Lewayne Bunting Dictator:   Dian Queen, P.A.C. [facility] CC:         Dr. Nicholes Mango K. Loleta Chance, M.D., [place], [place]  Dr. Otilio Miu, [place]   Referring Physician Discharge [place]  HISTORY OF PRESENT ILLNESS:  Essentially the procedure is that of a 50 year old white female, status post RF ablation for atrial flutter/SVT.  She is status post pulmonary vein ablation by Dr. Delena Serve in 1997.  Apparently this was complicated by pulmonary embolization and a TIA.  She is on lifelong Coumadin for a possible hypercoagulable state that has yet to be defined despite a prior hematological work-up for such.  She has severe chronic constipation.  She is admitted now for a frank syncopal episode with a bruise to her right arm and breast.  ACCESSORY CLINICAL FINDINGS:  Electrolytes and renal function totally normal. The TSH was 1.36.  The INR was 4.1 (a recent pro time in [place], [place], had resulted in a reduction in her Coumadin dose).  There were no arrhythmias noted during her 36 hours of monitoring.  She had no recurrent symptoms.  COURSE IN THE HOSPITAL:  She was seen in consultation by Doylene Canning. Ladona Ridgel, M.D., who recommended outpatient 2-D echocardiogram and 30-day event monitor and then follow-up with Nathen May, M.D., F.A.C.C.  He felt that this was probably neurally mediated.  FINAL DIAGNOSES: 1. Syncope, probably neurally mediated per Doylene Canning. Ladona Ridgel, M.D. 2. Status post radiofrequency ablation for supraventricular tachycardia and    atrial flutter. 3. Status post pulmonary vein ablation complicated by pulmonary embolization    in 1997.  On chronic lifelong Coumadin.  DISPOSITION:  Continue the same medications.  Will get an outpatient event monitor and echocardiogram.  She will follow up with  Nathen May, M.D., F.A.C.C.  I wonder if an implantable loop might have a significant yield here. DD:  07/30/01 TD:  07/31/01 Job: [id number]

## 2011-05-15 NOTE — H&P (Signed)
NAME:  [name], [name]                         ACCOUNT NO.:  [id number]   MEDICAL RECORD NO.:  [id number]                   PATIENT TYPE:  INP   LOCATION:  A302                                 FACILITY:  [facility]   PHYSICIAN:  Vania Rea, M.D.              DATE OF BIRTH:  1961/02/08   DATE OF ADMISSION:  07/20/2004  DATE OF DISCHARGE:                                HISTORY & PHYSICAL   PRIMARY CARE PHYSICIAN:  Milus Mallick. Lodema Hong, M.D.   CARDIOLOGIST:  Duke Salvia, M.D.   RHEUMATOLOGIST:  Aundra Dubin, M.D.   CHIEF COMPLAINT:  Swelling and redness of face and neck progressing over two  days.   HISTORY OF PRESENT ILLNESS:  This is a 50 year old lady with medical history  complicated by pulmonary embolism and atrial fibrillation, status post  ablation therapy on chronic Coumadin, atheroembolic TIA's, hypothyroidism,  chronic pain syndrome with confirmed fibromyalgia, chronic constipation  caused by what seems like megacolon status post total colectomy, who noticed  swelling of the lower lip with a pimple last week.  The patient thought it  was herpes simplex infection and was treating it with Abreva;  however, the  infection got worse and she came to the emergency room on Saturday morning  with what is felt to be cellulitis.  She was given a dose of Levaquin and  sent home.  Subsequent to that day, the patient said her face became burning  hot and red and she developed soreness and tightness in her throat.  She  came back to the emergency room on Saturday evening and the emergency room  physician at that time suspected a brown recluse spider bite.  She was given  Dilaudid for pain, given symptomatic treatment, and sent home with the  advise to return on 7:00 Sunday evening.  However, when the patient got home  she felt significantly worse and returned the following morning from where  she was admitted.   The patient was admitted by Dr. Delbert Harness yesterday and he noted  that she  was on quite high doses of narcotics and decided to reduce her OxyContin and  add p.r.n. Dilaudid to control her pain.  This seemed quite appropriate, but  when the patient's pain was not being controlled she called her primary care  physician who reinstituted the regular medications.  However, the patient  continues to complain of pain in her lip, neck, and throat.  The patient has  been continued on her Levaquin.   The patient has no clear recollection of a bite and cannot recall exactly  how the break in her skin started.  She has not been exposed to any sick  contacts.  She has been feeling warm, but did not document fever until  coming to the hospital.  She has been having no excessive drowsiness or  altered mental status.  She has had nausea and vomiting three episodes  over  the past three days, but she usually feels better after she vomits.  The  vomitus usually contains food or liquid recently ingested.   PAST MEDICAL HISTORY:  1. Hypothyroidism.  2. Chronic pain syndrome.  3. Confirmed fibromyalgia.  4. Degenerative joint disease of the cervical spine, status post cervical     spine fusion, history of cervical impingement.  5. Status post colectomy in 2003.  6. History of pulmonary emboli and TIA's in 1997.  7. Status post atrial flutter ablation.  8. Atrial fibrillation.  9. Possible ovarian cyst.   PAST SURGICAL HISTORY:  1. In 1983, status post breast implants with subsequent rupture and     subsequent removal.  2. In 1986, cesarean section.  3. In 1987, status post hysterectomy.  4. In 2003, fusion of cervical spine with cervical impingement.  5. In 2004, status post colectomy.  6. In 2000, revision of implants, axillary approach.  7. Status post cholecystectomy.   MEDICATIONS:  1. Coumadin 7.5 mg Monday, Wednesday, and Friday, 5 mg the other days.  2. OxyContin 80 mg q.12h.  3. Oxy IR 5 mg t.i.d.  4. Vytorin 10/20 at bedtime.  5. Valium 5 mg t.i.d.   6. Xanax 0.5 mg q.i.d.  7. Ritalin 10 mg b.i.d.  8. Aldactone 10 mg daily.  9. Multivitamins one daily after breakfast.  10.      Calcitrate one tablet b.i.d.  11.      Prenate one daily.  12.      Glucophage 250 mg b.i.d.  13.      Synthroid 150 mg daily.   ALLERGIES:  1. PENICILLIN.  2. KEFLEX.  3. CLEOCIN.  4. MACRODANTIN.  5. BIAXIN.  Which gives headache and rash.  1. CODEINE gives her migraine headaches.   SOCIAL HISTORY:  She lives with her husband of 12 years.  She has a 19-year-  old daughter by another father who is currently in medical school.  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  She has never smoked.  Denies alcohol or illicit drug  use.   FAMILY HISTORY:  She has no siblings.  Her daughter who is 67 has multiple  allergies, asthma, and also atrial fibrillation, status post ablation.  Mother is diabetic at age 20.  She has had several severe strokes.  Her  father is living with macular degeneration and GERD.   REVIEW OF SYSTEMS:  The patient has recurrent headaches which she says are  partly cluster, partly migraines, but also related to tension from her  muscle spasm from her cervical injuries.  She has no eye problems, but does  wear glasses.  No history of sinusitis, but currently her throat feels sore  and raw.  She occasionally has chest pain.  Has episodic exercise  intolerance.  She does carry a diagnosis of chronic fatigue syndrome in  association with her fibromyalgia.  She has no PND or orthopnea or lower  extremity edema.  She has had nausea and vomiting as outlined in the H&P.  She had deliberately been trying to lose weight.  She says she has lost more  inches than pounds.  She has noted no jaundice, no constipation, no  diarrhea, no bloody stool.  She does have a chronic history of recurrent urinary tract infections versus interstitial nephritis, investigated by an  urologist, and has  had episodes of pyelonephritis since childhood.  She  does  have pains in the neck, back, and hips as per fibromyalgia, also sometimes  as knee pains.  She has had TIA's in association with her atrial  fibrillation, and she says migraines.   PHYSICAL EXAMINATION:  GENERAL:  This is a somewhat obese, young lady laying  in bed.  Has a bag of ice on her head.  Is complaining of a headache and  burning on her face.  Otherwise, in fact, she looks quite well.  VITAL SIGNS:  Her temperature is 101.7, her pulse is 87, her respirations  are 18, blood pressure 100/50.  Her temperature has been over 100 since  yesterday in the hospital.  Her height is recorded at 5 feet 5 inches, and  her weight is 209 pounds.  HEENT:  Pupils are about 1 to 2 mm, round, equal, and reactive.  Mucous  membranes are pink and anicteric.  She is mildly dehydrated.  She is  receiving IV fluids.  On her face, there is no overall redness in the upper  part, but her left lower lip is swollen and indurated.  In the external skin  surface there is a 1/2 cm pustule, and along the left submandibular area it  is swollen and somewhat erythematous.  The lip area for about 1.5 inches is  swollen, firm, indurated, and tender.  On the inside of her mouth, there is  no vesicle seen and no erythema.  Her throat is moist.  There is no  erythema.  Right tonsil is somewhat enlarged for her age.  NECK:  There is no thyromegaly noted.  She does have some erythema on the  left side of her chest.  CHEST:  Clear to auscultation bilaterally.  ABDOMEN:  Obese, soft, but she seems to have a tender mass in the right  lower quadrant.  EXTREMITIES:  Trace edema bilaterally.  Pulses are 3+ bilaterally.  NEUROLOGIC:  She has no focal neurologic deficit.   LABORATORY DATA:  White count 8.4, hemoglobin 11.4, hematocrit 32.7, RDW  12.6, platelet count 205.  Her PT is 26.7, INR 3.5.  Her serum chemistry  recorded yesterday was normal.  Her LFT's recorded  yesterday:  Alkaline  phosphatase was 127, AST 176, ALT 133, total protein 6.1, and albumin 3.6.  Her calcium is 8.5.  Review of her records from Dr. Anthony Sar office:  She  had normal liver function tests in April.   ASSESSMENT:  1. Cellulitis of the left lower lip.  It seems reasonable to consider a     spider bite in the workup.  2. Probably some associated allergic phenomenon.  3. Chronic pain syndrome with severe headache.  4. Right lower quadrant abdominal mass of unclear etiology, but the patient     does give a history of abdominal surgery and some type of problem with     her ovaries.  5. Chronic medical problems as listed above.   PLAN:  Our plan for her with this lady in terms of her cellulitis, we will  increase her dose of Levaquin and we will add vancomycin to her current  regimen.  We will do blood cultures.  We will withhold Tylenol and monitor her liver function's.  For her pain, we will continue the OxyContin, and add  Dilaudid for breakthrough since she said it helped her in the emergency  room, and she currently feels that the Oxy IR may be aggravating her  headaches.  We will continue her benzodiazepines and consider increasing the  dose in case the headache is associated with  benzodiazepine withdrawal.  We will start her on Benadryl.  It will help her  to rest.  She says she is somewhat restless.  We will get an ultrasound of  her abdomen and pelvis to further examine the nature of this right lower  quadrant mass and tenderness.  We will continue as much of her home  medications as we can.     ___________________________________________                                         Vania Rea, M.D.   LC/MEDQ  D:  07/21/2004  T:  07/21/2004  Job:  [id number]   cc:   Milus Mallick. Lodema Hong, M.D.  [address]  [place], [place] [address]  Fax: [phone]

## 2011-05-15 NOTE — H&P (Signed)
NAME:  [name], [name]               ACCOUNT NO.:  [id number]   MEDICAL RECORD NO.:  [id number]          PATIENT TYPE:  INP   LOCATION:  A309                          FACILITY:  [facility]   PHYSICIAN:  Dirk Dress. Katrinka Blazing, M.D.   DATE OF BIRTH:  1961/01/20   DATE OF ADMISSION:  09/21/2005  DATE OF DISCHARGE:  [facility]                                HISTORY & PHYSICAL   This is a 50 year old female who is admitted with suspected acute  gastroenteritis and associated perirectal abscess.  The patient states that  she has had pain in the perirectal area for at least 2 weeks.  She has been  tried with multiple antibiotics, though it did  not improve.  Over about the  past 2 to 3 days, she has had crampy abdominal pain with nausea and  vomiting.  Yesterday she had perfuse diarrhea.  Her lower abdominal pain has  become worse.  She was seen in the emergency room for evaluation of her  nausea, vomiting, and diarrhea and was noted to have an enlarging, tender,  erythematous mass of the right buttock compatible with perirectal abscess.  The patient is admitted for symptomatic treatment of her gastroenteritis,  and she will have wide excision of the large perirectal abscess.  It is  possible that, because of all the antibiotic treatments, that she has C.  difficile colitis which is precipitated by the antibiotics, and that is the  cause of the nausea, vomiting, diarrhea.  She will be worked up for this  also.   PAST MEDICAL HISTORY:  1.  She has a prior history of MRSA infection of her lip.  2.  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  3.  Chronic fatigue syndrome.  4.  History of DVT.  5.  History of pulmonary embolus status post Greenfield filter placement.  6.  Severe degenerative disk disease of cervical and lumbar spine.  7.  Depression with anxiety.  8.  Severe cluster headaches.  9.  Gastroesophageal reflux disease.  10.  Fibromyalgia.  11. Hypothyroidism.  12. History of paroxysmal atrial fibrillation status post radiofrequency      ablation in 1987 and 1999.  13. Hyperlipidemia.   SURGERIES:  1.  Total abdominal colectomy with cholecystectomy 2003.  2.  Total abdominal hysterectomy in 1997.  3.  Appendectomy in 1990.  4.  C section.  5.  Breast augmentation in 1983.  6.  Removal of breast implants for rupture in 1993.  7.  Anal fissurectomy 2000.  8.  C5-6 cervical diskectomy 2003.  9.  Left mastectomy with expander placement May 2006.  10. Greenfield filter placement May 08, 2005.   ALLERGIES:  KEFLEX, MACRODANTIN, PENICILLIN, CLEOCIN, CODEINE, BIAXIN,  SKELAXIN, and SULFA.   MEDICATIONS:  1.  Synthroid 175 mcg daily.  2.  OxyContin 80 mg twice daily.  3.  Aldactone 50 mg daily.  4.  Flexeril 10 mg p.r.n.  5.  Xanax 0.5 mg 3 times a day.  6.  Valium 5  mg 4 times a day.  7.  OxyIR 15 mg every 8 hours p.r.n. for breakthrough pain.   FAMILY HISTORY:  Positive for colon cancer, diabetes mellitus, stroke,  atherosclerotic heart disease.   SOCIAL HISTORY:  She is married, disabled.  She has one child.  She does not  drink, smoke, or use drugs.   REVIEW OF SYSTEMS:  Essentially positive in all organ systems. She has  tenderness in calf of both legs. She has diffuse joint pain. She has  headaches.  She has chronic fatigue.  She has dizziness.  History of chest  pain, shortness of breath, chronic neck and back pain, recurrent severe  headaches, chronic abdominal pain, intermittent diarrhea, and multiple other  complaints.   PHYSICAL EXAMINATION:  GENERAL: Depressed-appearing female who looks about  her stated age.  VITAL SIGNS: Blood pressure 99/57, pulse 62, respirations 16, temperature  98.4.  HEENT:  Unremarkable.  There is no oropharyngeal areas of induration or  inflammation.  NECK:  Supple.  No JVD, bruit, adenopathy, or thyromegaly.  CHEST:  Clear to auscultation.  No rales, rubs,  rhonchi, or wheezes.  There  are operative changes of the left breast with a palpable expander beneath  the operative site on the left chest wall with thick induration but no  ecchymosis.  HEART:  Regular rate and rhythm without murmur, gallop, or rub.  ABDOMEN:  Obese, soft, nondistended.  Moderate tenderness in the  hypogastrium and along the colon in the left lower quadrant.  She has  hyperactive bowel sounds.  EXTREMITIES:  Diffuse tenderness of the calf both legs posteriorly but no  venous cords, no tenderness over the superficial saphenous system.  No  peripheral edema.  No induration.  No chronic venous changes.  RECTAL:  Area shows external hemorrhoidal tags.  There is a large, inflamed,  draining mass of the right buttock.  It does not appear to extend into the  perirectal space as of this time.  Digital exam was not done because of  pain.  NEUROLOGIC:  No focal deficit.  No motor, sensory, or cerebellar deficits.   IMPRESSION:  1.  Perirectal abscess.  2.  Gastroenteritis.  Must rule out Clostridium difficile colitis.  3.  Prior history of methicillin-resistant Staphylococcus aureus infection      of the face.  4.  Padgett's disease with questionable left breast.  5.  Chronic fatigue syndrome.  6.  History of deep vein thrombosis.  7.  History of pulmonary embolus status post Greenfield filter placement.  8.  Severe degenerative disk disease of cervical and lumbar spine.  9.  Depression with anxiety.  10. Severe cluster headaches.  11. Gastroesophageal reflux disease.  12. Fibromyalgia.  13. Hypothyroidism.  14. History of paroxysmal atrial fibrillation status post radiofrequency      ablation x2.  15. Hyperlipidemia.   PLAN:  1.  The patient will get symptomatic treatment of her gastroenteritis.  2.  Will check stool for C. difficile.  3.  She will be started on Flagyl and FloraQ empirically. 4.  I will schedule wide excision of the perirectal abscess in the  morning.  5.  She will started on Lovenox 60 mg subcutaneously q.24 h.  6.  Will continue her baseline medications.  7.  Will get wound cultures.      Dirk Dress. Katrinka Blazing, M.D.  Electronically Signed     LCS/MEDQ  D:  09/21/2005  T:  09/21/2005  Job:  [id number]   cc:  Milus Mallick. Lodema Hong, M.D.  Fax: [phone]

## 2011-05-15 NOTE — Op Note (Signed)
[facility]  Patient:    [name], [name] Visit Number: [id number] MRN: [id number]          Service Type: END Location: DAY Attending Physician:  Malissa Hippo Dictated by:   Lionel December, M.D. Proc. Date: 06/15/02 Admit Date:  06/15/2002   CC:         Syliva Overman, M.D.   Operative Report  PROCEDURE:  Esophagogastroduodenoscopy followed by total colonoscopy.  INDICATIONS FOR PROCEDURE:  [name] is a 50 year old Caucasian female with multiple medical problems who has chronic GERD whose symptoms are heartburn and regurgitation that has been intractable not responding to therapy. She also has chronic constipation and has been on megadoses of various medications but without any benefit. I saw her one year ago and arranged for her to have anorectal manometry and defecography at [facility] but it has not been done to date. She is also having intermittent rectal bleeding. Her family history is positive for colon carcinoma in her grandfather. She is undergoing diagnostic studies. [name] GI problems are further complicated because of the fact that she is on OxyContin and Xanax which can further slow down her GI motility.  The patient was given SBE prophylaxis for the procedure.  PREOP MEDICATION:  Cetacaine spray for pharyngeal topical anesthesia, Demerol 75 mg IV, Versed 12 mg IV in divided dose.  INSTRUMENT:  Olympus video system.  FINDINGS:  The procedure performed in endoscopy suite. The patients vital signs and O2 saturations were monitored during the procedure and remained stable.  #1 - ESOPHAGOGASTRODUODENOSCOPY.  The patient was placed in the left lateral decubitus position and the scope was passed via the oropharynx without any difficulty into the esophagus.  ESOPHAGUS:  The mucosa of the esophagus was normal. The squamocolumnar junction was unremarkable and there was a small starting hiatal hernia. Pictures taken for the  record.  STOMACH:  Was empty and distended very well with insufflation. The stomach did not appear to be very large and there was no food debris in it. The pyloric channel was patent. The mucosa of the gastric body, antrum, pyloric channel as well as the angularis and fundus was normal.  DUODENUM:  Examination of the bulb and second part of the duodenum was normal. The patient was prepared for procedure #2.  #2- TOTAL COLONOSCOPY.  A rectal examination performed. No abnormalities noted on external or digital exam. The scope was placed in the rectum and advanced under direct vision to the sigmoid colon. She had dense pigmentation involving the colon that was examined consistent with melanosis coli. She had some formed stool in her sigmoid colon but she had a lot of formed stool in her transverse colon which was somewhat dilated. The scope was passed through her hepatic flexure and she had more stool; therefore, the scope was not passed to the cecum. As the scope was withdrawn, the mucosa was once again carefully examined and there was no abnormality other than changes of melanosis coli. She did have fairly prominent and normal haustral markings in her left colon which was not dilated. Her rectal mucosa was normal. The scope was retroflexed to examine the anorectal junction and she had hemorrhoids below the dentate line. The endoscope was straightened and withdrawn. The patient tolerated the procedure well.  FINAL DIAGNOSES:  Small sliding hiatal hernia, otherwise, esophagogastroduodenoscopy.  The colonoscopy only completed to the hepatic flexure because of a large amount of stool in the proximal colon.  Diffuse changes of melanosis coli.  External  hemorrhoids.  RECOMMENDATIONS:  1. She will continue antireflux measures and Prevacid at 30 mg b.i.d.  2. She will go ahead and resume her Coumadin at her usual dose.  3. I do not feel that Sitzmark study would be of great help. I have  a feeling     that it would very abnormal. Will make another attempt to do anorectal     manometry and defecography. In the meantime, she will continue her     bowel regimen. Dictated by:   Lionel December, M.D. Attending Physician:  Malissa Hippo DD:  06/15/02 TD:  06/16/02 Job: [id number]

## 2011-05-15 NOTE — Discharge Summary (Signed)
NAME:  Barbara, [name]               ACCOUNT NO.:  [id number]   MEDICAL RECORD NO.:  [id number]          PATIENT TYPE:  INP   LOCATION:  A309                          FACILITY:  [facility]   PHYSICIAN:  Dirk Dress. Katrinka Blazing, M.D.   DATE OF BIRTH:  1961/07/30   DATE OF ADMISSION:  09/21/2005  DATE OF DISCHARGE:  09/30/2006LH                                 DISCHARGE SUMMARY   DISCHARGE DIAGNOSES:  1.  Perirectal abscess.  2.  Gastroenteritis.  3.  Prior history of methicillin-resistant Staphylococcus infection.  4.  Padgett's disease left breast with carcinoma.  5.  Chronic fatigue syndrome.  6.  History of deep vein thrombosis.  7.  History of pulmonary embolus status post Greenfield filter placement.  8.  Severe degenerative disk disease in cervical and lumbar spine.  9.  Depression with anxiety.  10. Severe cluster headaches.  11. Gastroesophageal reflux disease.  12. Fibromyalgia.  13. Hypothyroidism.  14. History of paroxysmal atrial fibrillation status post radiofrequency      ablation 1987 and 1999.  15. Hyperlipidemia.  16. Upper respiratory infection.   SPECIAL PROCEDURE:  Wide excision of perirectal abscess on September 22, 2005.   DISPOSITION:  The patient was discharged home in stable, satisfactory  condition.   DISCHARGE MEDICATIONS:  1.  Glucophage 500 daily.  2.  Valium 5 mg q.6 h.  3.  OxyContin 80 mg b.i.d.  4.  OxyIR 5 mg q.8 h p.r.n.  5.  Synthroid 175 mcg daily.  6.  Aldactone 50 mg daily.  7.  Xanax 0.5 mg q.6 h.  8.  __________  one daily x10 days.  9.  Fioricet 1-2 q.6 h p.r.n. headaches.  10. Zofran 4 mg t.i.d.   DISCHARGE INSTRUCTIONS:  Home Health coverage for cleansing of her wound and  will be seen in the office in two weeks.   SUMMARY:  A 50 year old female admitted with suspected acute gastroenteritis  and an associate perirectal abscess.  The patient gives history of having  perirectal pain for two weeks.  She had been tried with multiple  antibiotics  without improvement.  Over the three days prior to admission, she had  developed crampy abdominal pain with nausea, vomiting and profuse diarrhea.  The lower abdominal pain became worse.  She was seen in the emergency room  for evaluation of gastroenteritis, and it was noted that she had a  perirectal abscess.  She was admitted for symptomatic treatment of  gastroenteritis for wide excision of the large perirectal abscess.  There  was some concern that she may have developed a C. difficile colitis because  of the multiple antibiotics she had used.  Extensive past medical history  was given at admission.   PHYSICAL EXAMINATION:  VITAL SIGNS:  Blood pressure 99/57, pulse 62,  respirations 16, temperature 98.4.  LUNGS:  Clear.  BREASTS:  Operative changes on the left breast with palpable expanded  operative site on chest wall with thick induration, but no ecchymosis.  ABDOMEN:  Moderate tenderness in the hypogastrium and along the left colon.  RECTAL:  External hemorrhoidal tags  and also a large, inflamed, draining  mass of the right buttock.   HOSPITAL COURSE:  The patient was admitted, started on IV antibiotics.  She  was treated for gastroenteritis.  Stool was checked for C. difficile.  She  was empirically started on oral Flagyl and __________ .  She was given  Lovenox for BPP prophylaxis.  Wound cultures were taken.  The patient was  taken to the operating room where the abscess was incised and drained.  She  tolerated this without much difficulty.  On postop day #1, she developed  chest pain, neck pain, cough, congestion, headache and was felt to have a  nasopharyngeal congestion suggestive of upper respiratory infection.  This  was treated symptomatically.  She gradually improved.  Appetite improved.  Nausea was resolved.  By September 28, she was tolerating a regular diet.  Her other symptoms gradually improved.  By September 29, it was determined  that her wound was  adequately healed, and it could be taken care of as an  outpatient.  She was therefore discharged home to have close follow up as an  outpatient by Home Health and by our office.      Dirk Dress. Katrinka Blazing, M.D.  Electronically Signed     LCS/MEDQ  D:  11/15/2005  T:  11/16/2005  Job:  [id number]

## 2011-05-15 NOTE — Consult Note (Signed)
NAME:  [name], [name]                         ACCOUNT NO.:  [id number]   MEDICAL RECORD NO.:  [id number]                   PATIENT TYPE:  INP   LOCATION:  3307                                 FACILITY:  [facility]   PHYSICIAN:  Zola Button T. Lazarus Salines, M.D.              DATE OF BIRTH:  Jan 21, 1961   DATE OF CONSULTATION:  07/22/2004  DATE OF DISCHARGE:                                   CONSULTATION   CHIEF COMPLAINT:  Left lower lip painful swelling.   HISTORY:  50 year old white female awakened approximately four days ago with  a small apparent pustule on the external surface of her left lower lip which  she may have scratched.  Over the next 48 hours, it proceeded to get much  bigger and more tender.  She had some tenderness spreading under her chin  into her left side of the neck but no discoloration.  She was seen in the  emergency room  and started on oral Levaquin.  Finally, she was admitted to  [facility] for intravenous Levaquin.  She has multiple drug  allergies.  She was observed approximately 48 hours at [facility] and then  transferred to [facility] with progressive swelling.  According to  the patient, her doctor in [place] felt that there might be something to  aspirate and anesthetized her lower lip and attempted to do a needle  aspiration with apparent minimal success.  The lip has continued to swell  and now seems to be having some drainage on the internal surface of the lip.  She was told by one of her doctors that she might have been bit by a brown  recluse spider but has no actual recollection of a spider bite.  She has  never had a prior similar problem with cellulitis or abscess.  She is not  diabetic or, otherwise, immune compromised.  Medical conditions include  cardiac arrhythmias and subsequent pulmonary emboli for which she is on  chronic Coumadin therapy.   PHYSICAL EXAMINATION:  This is a pleasant adult white female appearing  younger than  stated age.  She has superficial disclamation and massive  swelling of the lower lip, mostly on the left side.  There was an external  eschar and also what appears to be a granulation tissue type draining tract  on the internal surface.  I did not compress this.  The lip is firm but not  rock hard.  The dental status appears intact.  She has slight tenderness  through the left neck, levels 1, 2, and 3, without discrete nodes.  No  apparent skin erythema or cellulitis.  The ears are clear.  Anterior nose is  clear.  The oral cavity is, otherwise, moist and clear.  Oropharynx briefly  seen.  I did not examine the nasopharynx or hypopharynx.   IMPRESSION:  Left lower lid abscess with incomplete drainage.   PLAN:  With informed consent, I anesthetized the area with Cetacaine spray  into the oral vestibule and then 10 mL of 1% Xylocaine with 1:100,000  epinephrine in stages into the left mental nerve region for anesthesia of  the entire left lower lip.  Upon achieving adequate anesthesia, the lip was  carefully cleaned with Betadine solution.  The external eschar was carefully  peeled away and there was some apparent pus but no actual drainage  externally.  There was a draining tract internally.  This was enlarged  approximately 1 cm total length sharply.  The abscessed pocket was probed  with a hemostat and all loculations were carefully removed and expressed.  A  culture was taken from the depth of the cavity.  A sliver of Penrose drain  was fashioned and placed into the cavity and secured to the mucosal surface  of the lip with separate 3-0 silk sutures.  Hemostasis was spontaneous.  The  patient tolerated the procedure nicely.   I would make sure to cover her with vancomycin for MRSA and with something  for anaerobic coverage, as well.  I anticipate that the cellulitis component  of her disease should settle down promptly upon removal of the loculated  abscess.  I am not sure if this is a  standard abscess or if she might have  had some sort of tissue necrosis with secondary infection such that a spider  might have caused.  I discussed this all with the patient.  I will have them  use antibiotic ointment to keep her lips soft.  I will remove the drain in  two days.  She understands and agrees.                                               Gloris Manchester. Lazarus Salines, M.D.    KTW/MEDQ  D:  07/22/2004  T:  07/22/2004  Job:  [id number]   cc:   Laurell Roof, M.D.  Int Medicine Resident - [address]  [place], [place] [address]  Fax: [phone]   Madaline Guthrie, M.D.  [address]  [place] [address]  Fax: [phone]   Dr. Jorge Mandril, [place]

## 2011-05-15 NOTE — Discharge Summary (Signed)
[facility]  Patient:    [name], [name]                    MRN: [id number] Adm. Date:  [id number] Disc. Date: [id number] Attending:  Londell Moh CC:         Ardelia Mems, M.D.   Discharge Summary  ADMISSION DIAGNOSES: 1. Interstitial cystitis. 2. Atrial fibrillation. 3. History of deep venous thrombosis. 4. Hypothyroidism. 5. Fibromyalgia.  DISCHARGE DIAGNOSES: 1. Interstitial cystitis. 2. Atrial fibrillation. 3. History of deep venous thrombosis. 4. Hypothyroidism. 5. Fibromyalgia.  PRINCIPAL PROCEDURE: 1. Cystoscopy. 2. Hydrodistention. 3. Marcaine ______ installation. 4. Kenalog injection.  All done on 03/11/01.  HISTORY OF PRESENT ILLNESS:  This 50 year old female has known interstitial cystitis and always responds very nicely to hydrodistention.  She has not responded particularly well to other forms of therapy.  She has requested that a repeat hydrodistention be performed.  She needs to be admitted to the hospital for management of her coagulation.  PAST MEDICAL HISTORY:  Carefully delineated in the patients admission history and physical.  Principal findings include her past history of atrial fibrillation, history of deep venous thrombosis, and a past history of fibromyalgia.  FAMILY HISTORY:  Noncontributory.  SOCIAL HISTORY:  Noncontributory.  REVIEW OF SYSTEMS:  Noncontributory.  HOSPITAL COURSE:  The patient was taken to the operating room on 03/11/01, where she underwent cystoscopy, hydrodistention of bladder, Marcaine ______ installation, Marcaine and Kenalog injection.  The patient was seen by cardiology in the postoperative period and was started on heparin.  She was eventually converted to Coumadin.  The patient developed some ecchymosis of the abdomen.  Etiologies remained unclear, but it may have been from injection therapy.  The patient was felt to be okay for discharge.  She will follow up in the Coumadin  Clinic at [facility].  The patient was discharged on 03/14/01.  She will stay on her regular medications, and no new medications necessary after the hydrodistention.  FOLLOWUP:  Urology in two weeks time. DD:  03/26/01 TD:  03/27/01 Job: [id number]

## 2011-05-15 NOTE — H&P (Signed)
[facility]  Patient:    [name], [name]                    MRN: [id number] Adm. Date:  [id number] Disc. Date: [id number] Attending:  Londell Moh                         History and Physical  DATE OF DISCHARGE:  March 14, 2001  ADMITTING DIAGNOSES: 1. Interstitial cystitis. 2. Atrial fibrillation. 3. History of deep venous thrombosis. 4. Hypothyroidism. 5. Fibromyalgia.  HISTORY OF PRESENT ILLNESS:  This is a 50 year old female who is known to have interstitial cystitis and always responded very nicely to hydrodistention. The patient has not responded to other forms of therapy.  She had a hydrodistention performed on the day of admission and needs to be admitted for management of her coagulation.  PAST MEDICAL HISTORY:  The patients principal findings on past medical history include atrial fibrillation, history of deep venous thrombosis, and a past history of fibromyalgia.  FAMILY HISTORY:  Noncontributory.  SOCIAL HISTORY:  Noncontributory.  REVIEW OF SYSTEMS:  Noncontributory.  PHYSICAL EXAMINATION:  GENERAL:  She is a well-developed and well-nourished female in no acute distress.  HEENT EXAMINATION:  Normocephalic and atraumatic.  Cranial nerves 2-12 are grossly intact.  NECK:  Supple with no adenopathy or thyromegaly.  LUNGS:  Clear.  HEART:  Regular rate and rhythm with no murmurs, thrills, gallops, rubs, or heaves.  ABDOMEN:  Soft and nontender with no palpable masses, rebound, or guarding.  PELVIC EXAMINATION:  Showed a very tender bladder, but otherwise unremarkable.  EXTREMITIES:  Have no cyanosis, clubbing, or edema.  IMPRESSION: 1. Exacerbation of interstitial cystitis. 2. History of deep venous thrombosis requiring anticoagulation.  PLAN:  Admit following cystoscopy and ______ for treatment by the cardiologist in management of her anticoagulation status. DD:  04/08/01 TD:  04/09/01 Job:  [id number]

## 2011-05-15 NOTE — Consult Note (Signed)
NAME:  [name], [name]                         ACCOUNT NO.:  [id number]   MEDICAL RECORD NO.:  [id number]                   PATIENT TYPE:  INP   LOCATION:  A225                                 FACILITY:  [facility]   PHYSICIAN:  Elliot Cousin, M.D.                 DATE OF BIRTH:  1961-07-14   DATE OF CONSULTATION:  08/02/2002  DATE OF DISCHARGE:                          INTERNAL MEDICINE CONSULTATION   REASON FOR CONSULTATION:  Medical management of chronic medical conditions  including chronic pain, hypothyroidism, and chronic Coumadin therapy.   HISTORY OF PRESENT ILLNESS:  The patient is a 50 year old Caucasian woman  who was admitted to the hospital by Dr. Elpidio Anis for an elective  cholecystectomy and subtotal colectomy.  The patient has had severe chronic  constipation for many years failed with medical therapy only.  She has had  chronic nausea and occasional vomiting thought to be secondary to chronic  cholecystitis and a hypofunctioning gallbladder.  The patient has been  evaluated by gastroenterologist Dr. Karilyn Cota and multiple doctors in the past  for a chief complaint of chronic constipation.  Colonoscopy by Dr. Karilyn Cota in  June 2003 revealed melanosis coli but with no other abnormalities.  The EGD  performed on the same date by Dr. Karilyn Cota revealed a small starting hiatal  hernia with no other abnormalities.  The patient was thought to have a  condition consistent with chronic colonic inertia.  During the evaluation  per Dr. Lodema Hong for chronic nausea a HIDA scan was ordered.  The HIDA scan  revealed that the patient had a gallbladder ejection fraction of 22%.  Given  the chronicity of the patient's symptoms, she agreed to an elective subtotal  colectomy and cholecystectomy hopefully to relieve her chronic symptoms.   The patient has no current complaints.  She is preparing herself mentally  for the surgery however, she does have some concerns regarding the temporary  discontinuation of her Coumadin.  The patient is currently receiving IV  heparin that will eventually be discontinued prior to surgery on schedule  for August 04, 2002.   PAST MEDICAL HISTORY:  [name] past medical history is significant for  cluster headaches, chronic fatigue syndrome, fibromyalgia, and  hypothyroidism which appear to be well managed with medications.   REVIEW OF SYSTEMS:  Her review of systems is positive for chronic  intermittent headaches usually relieved with OxyContin, positive for chronic  fatigue, positive for occasional chest pain with palpitations, positive for  occasional shortness of breath, heartburn, nausea, occasional vomiting,  bloating, occasional dysuria and urinary frequency from history of  interstitial nephritis, occasional hematuria, neck pain and stiffness  secondary to recent cervical spine surgery, abdominal pain, occasional black  tarry stools and occasional hematochezia, positive for anxiety, insomnia,  occasional depression, occasional bilateral breast pain and easy bruising.   Her review of systems is negative for fever, chills, orthopnea.   PAST MEDICAL HISTORY:  1. Chronic constipation.     a. Colonoscopy June 2003 by Dr. Karilyn Cota revealed chronic melanosis coli        otherwise normal exam.  2. Chronic nausea.     a. EGD by Dr. Karilyn Cota in June 2003 revealed small sliding hiatal hernia        otherwise a negative study.  3. Chronic Coumadin therapy secondary to bilateral pulmonary emboli in June     1997 following her radiofrequency ablation for supraventricular     tachycardia.  4. History of supraventricular tachycardia requiring radiofrequency ablation     on the right side in 1987 and on the left side in 1997.  This was     performed at [facility].  5. Status post C5-C6 cervical diskectomy April 2003 by Dr. Danielle Dess secondary     to degenerative joint disease.  6. Cluster migraines.  7. Chronic fatigue  syndrome.  8. Fibromyalgia.  9. Hypothyroidism.  10.      History of hypercholesterolemia.  11.      Multiple drug allergies (see below).  12.      History of transient ischemic attacks in 1997.  13.      Status post hysterectomy in 1997 secondary to endometriosis which     was diagnosed in 1986.  Hysterectomy was complicated by hemorrhage     requiring 4 units of packed red blood cells.  14.      Status post appendectomy in 1990.  15.      Status post C-section in 1986.  16.      Status post breast augmentation in 1983.  17.      Breast augmentation implants ruptured and had to be surgically     removed in 1993.  18.      Another breast surgery in June 2000 complicated by hemorrhage     requiring 7 units of packed red blood cells.  19.      Status post surgery for an anal fissure repaired by Dr. Luan Pulling     approximately 3 years ago.  20.      Chronic interstitial cystitis.   CURRENT MEDICATIONS:  1. Coumadin 5 mg x5 days weekly and 7.5 x2 days weekly.  2. Prevacid 30 mg b.i.d.  3. OxyContin 80 mg t.i.d.  4. Synthroid 150 mcg q.d.  5. Peri-Colace four tablets q.h.s.  6. Xanax 0.5 mg t.i.d. to q.i.d. p.r.n.  7. Flexeril 10 mg b.i.d. p.r.n.  8. Phenergan 25 mg t.i.d. p.r.n.  9. TriCor 160 mg q.d.   ALLERGIES:  The patient has allergies to KEFLEX, MACRODANTIN, PENICILLIN,  CLEOCIN, CODEINE, BIAXIN, and SKELAXIN.   FAMILY HISTORY:  Her mother is 74 years of age and has a history of type 1  diabetes and also has a history of stroke.  She also has bilateral  cataracts.  Her father is 17 years of age and he has macular degeneration  and gastroesophageal reflux disease.  She had one brother who was stillborn.   SOCIAL HISTORY:  The patient is married and has a 35 year old daughter.  She  is disabled.  She graduated from high school.  She also had 1 year of  college.  She denies alcohol, tobacco, and street drugs.  PHYSICAL EXAMINATION:  VITAL SIGNS:  Temperature 97.5, pulse 69,  respiratory  rate 20, blood pressure 121/75.  GENERAL:  The patient is an obese, pleasant, alert, white female who is  currently lying in bed in no acute distress.  HEENT:  Head is normocephalic, nontraumatic.  Pupils are equal, round,  reactive to light.  Extraocular movements are intact.  Conjunctivae are  clear.  Sclerae are white.  Oropharynx reveals moist mucous membranes; no  exudates, no erythema.  Nasal mucosa is moist; no drainage, no sinus  tenderness.  NECK:  She does have a well-healing anterior cervical scar approximately 2  cm in length.  Her neck is supple; no adenopathy, no thyromegaly.  LUNGS:  Her lungs are completely clear to auscultation bilaterally.  HEART:  S1, S2; no murmurs, rubs, or gallops.  ABDOMEN:  Moderately obese, positive bowel sounds, soft, mildly tender in  the left lower quadrant however, no rebound or guarding.  No masses  palpated.  No hepatosplenomegaly.  RECTAL:  Deferred.  EXTREMITIES:  The patient has no joint abnormalities.  She has no pretibial  edema.  Her pedal pulses are 2+ bilaterally.  She has good muscle tone and  strength throughout.  NEUROLOGICAL:  The patient is alert and oriented x3.  Cranial nerves II-XII  intact.  Strength is 5/5 throughout.  Sensation is intact throughout.  PSYCHOLOGICAL:  The patient is pleasant, alert.  She has appropriate  communication however, she is a little anxious concerning the upcoming  surgery and the temporary discontinuation of the Coumadin.   ADMISSION LABORATORIES:  PT 17.5, INR 2.0, PTT 34.   ASSESSMENT:  1. Chronic constipation and chronic cholecystitis with gallbladder     dysfunction.  2. Hypothyroidism.  3. History of bilateral pulmonary emboli on lifelong Coumadin.  4. Chronic migraines.  5. Chronic fatigue syndrome/fibromyalgia.   PLAN:  1. We will follow the patient medically with Dr. Katrinka Blazing.  2. Agree with continuation of the patient's chronic OxyContin when taking     p.o. however,  the patient will probably need a PCA morphine pump and/or     Demerol PCA after surgery.  3. Agree with stopping the Coumadin and placing the patient on intravenous     heparin which will be stopped prior to surgery.  4. Would start either subcu heparin or low-dose Lovenox shortly after     surgery as needed along with lower extremity compression devices.  5. Postop would treat the patient prophylactically with either Protonix 40     mg IV q.d. or Pepcid 20 mg IV q.12h.  6. Continue Synthroid 150 mcg q.d. while taking p.o.  7. Continue Xanax 0.5 mg b.i.d. and 1 mg q.h.s.  8. Would obtain blood work in the morning along with a baseline EKG and     chest x-ray.                                               Elliot Cousin, M.D.    DF/MEDQ  D:  08/03/2002  T:  08/07/2002  Job:  [id number]

## 2011-05-15 NOTE — H&P (Signed)
NAME:  [name], [name]                         ACCOUNT NO.:  [id number]   MEDICAL RECORD NO.:  [id number]                   PATIENT TYPE:  INP   LOCATION:  3710                                 FACILITY:  [facility]   PHYSICIAN:  Duke Salvia, M.D.               DATE OF BIRTH:  10-17-1961   DATE OF ADMISSION:  07/03/2003  DATE OF DISCHARGE:                                HISTORY & PHYSICAL   PRIMARY CARE PHYSICIAN:  Milus Mallick. Lodema Hong, M.D.   CHIEF COMPLAINT:  I've been feeling poorly lately.   HISTORY OF PRESENT ILLNESS:  Ms. Mccarron is a 50 year old female who had an  episode of sharp chest pain which started in the neck and then radiated to  the chest and to the left arm.  She drove to [facility], but by the  time she arrived, the pain had subsided, and she went home.  Lately she  feels as if her heart has been slamming against her chest.  It is episodic.  The feeling is accompanied by sharp chest pain.  This happens 6 to 10 times  a month.  There is no precipitating factor.  She has no concurrent symptoms  such as dyspnea, diaphoresis, or nausea or vomiting.  She was seen by Dr.  Graciela Husbands and underwent Cardiolite stress study.  Her ejection fraction was 67%.  The study showed anteroseptal hypokinesis at an area of a reversible defect.  Of note, the patient has an extensive history of cardiac dysrhythmia.  She  has had ablation of the right atrium for atrial flutter with rapid  ventricular rate in 1996, subsequent ablation of the right atrium for atrial  fibrillation with rapid ventricular rate in 1996, and subsequent ablation of  the left atrium in 1997 with pulmonary embolus post procedure.  The patient  is now brought to [facility].  She will have her Coumadin reversed.  INR this morning, July 6, was 1.4.  She will be started on IV heparin and  scheduled for left heart catheterization as soon as possible.   ALLERGIES:  PENICILLIN, KEFLEX, MACRODANTIN, CLEOCIN,  BIAXIN gives her  cluster migraines.  CODEINE gives her migraine headache.   MEDICATIONS:  1. OxyContin 60 mg q.12h.  2. OxyIR 5 mg every 8 hours as needed for breakthrough pain.  3. Synthroid 150 mcg daily.  4. Valium 5 mg every 6 hours.  5. Valium 5 mg 2 at bedtime.  6. Xanax 0.5 mg q.6h.  7. Xanax 0.5 mg 2 at bedtime.  8. Magnesium oxide 400 mg 2 daily.  9. Lasix 20 to 40 mg as needed for edema.   PAST MEDICAL HISTORY:  1. Atheroembolic disease.  She is on chronic Coumadin.  She did have a     pulmonary embolus in 1997 and a TIA secondary to atrial fibrillation     ablation.  2. Hypothyroidism.  3. Status post  breast implants with subsequent removal.  4. Dysrhythmias as mentioned above.  5. Chronic pain syndrome secondary to degenerative joint disease in cervical     spine.  6. Chronic constipation.    PAST SURGICAL HISTORY:  1. In 1983, status post breast implants with subsequent rupture and     subsequent removal.  2. In 1986, C section.  3. In 1987, status post partial hysterectomy.  4. In 2003, fusion of cervical spine with cervical impingement.  5. In 2004, status post colectomy, 2 units red blood cells transfused.  6. In 2000, revision of the implants, axillary approach; 9 units of packed     red blood cells with prolonged hospitalization.   SOCIAL HISTORY:  The patient has been married 11 years. She has one child.  She is disabled.  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  She has never smoked.  She does not partake of alcoholic beverages.   FAMILY HISTORY:  She has no siblings.  Mother is living at age 26; she has  diabetes and has suffered recently three severe strokes.  Father is living  with macular degeneration.   REVIEW OF SYSTEMS:  The patient mentions episodic chest pain with radiation  to the left arm.  She experiences edema to the lower extremities.  She does  have periods of shortness of breath when she has this episodic chest  pain.  She has a persistent weakness in the left upper extremity and left lower  extremity secondary to her cervical impingement.  She has a chronic pain  syndrome.  She does not have any prior history of diabetes mellitus,  hypertension, GI bleed, peptic ulcer disease.  She does have a prior history  of pulmonary embolism.   PHYSICAL EXAMINATION:  GENERAL:  This patient is alert and oriented x 3,  quite a good historian, no acute distress.  HEENT:  Normocephalic and atraumatic.  Eyes: Pupils are equal, round, and  reactive to light.  Extraocular movements intact.  Oropharynx shows she does  not wear dentures,  Mucous membranes are moist. Tongue without lesion or  erythema.  NECK:  Supple.  No carotid bruits auscultated.  No jugular venous  distention.  No cervical lymphadenopathy.  HEART:  Regular rate and rhythm with an S1, S2.  No murmur auscultated.  Telemetry shows sinus rhythm, heart rate of 93.  LUNGS:  Clear to auscultation and percussion bilaterally.  ABDOMEN:  Midline laparotomy incision, well healed with some keloids.  Abdomen is soft, nontender.  Bowel sounds are present throughout.  No  hepatosplenomegaly.  UROGENITAL:  Exam deferred.  RECTAL:  Exam deferred.  EXTREMITIES:  Show mild edema in both feet.  There is no ankle edema.  She  has palpable pedal pulses bilaterally.  NEUROLOGIC:  Alert and oriented x 3.  Cranial nerves II-XII grossly intact.  She has mild decrease in strength in both left upper and left lower  extremity.   LABORATORY DATA:  There is no chest x-ray.   EKG shows sinus rhythm with a pulse of 94.   There are no labs existent at this time.  They will be drawn.   IMPRESSION:  1. History of dysrhythmia status post ablation x 3.  2. Episodic chest pain with abnormal Cardiolite done June 11, 2003.  3. Atheroembolic disorder on chronic Coumadin.  4. Chronic pain syndrome.  5. Hypothyroidism.   PLAN: 1. Start IV heparin.  INR was 1.4 this  morning.  2. Pain control.  She has high pain tolerance.  3. Possible left heart catheterization on the morning of July 7.  She will     be n.p.o. after midnight.  The patient may need a PCA pump post     catheterization.     Maple Mirza, P.A.                    Duke Salvia, M.D.    GM/MEDQ  D:  07/03/2003  T:  07/03/2003  Job:  [id number]   cc:   Milus Mallick. Lodema Hong, M.D.  [address]  [place], [place] [address]  Fax: [phone]    cc:   Milus Mallick. Lodema Hong, M.D.  [address]  [place], [place] [address]  Fax: [phone]

## 2011-05-15 NOTE — H&P (Signed)
[facility]  Patient:    [name], [name]                        MRN: [id number] Adm. Date:  03/11/01 Attending:  Jamison Neighbor, M.D. CC:         Nathen May, M.D. [facility]   History and Physical  ADMITTING DIAGNOSES: 1. Interstitial cystitis. 2. Atrial fibrillation. 3. History of venous thrombosis. 4. Hypothyroidism. 5. Fibromyalgia. 6. Lumbar disk disease.  HISTORY OF PRESENT ILLNESS:  This _________ year-old is known to have interstitial cystitis as well as a past history of dysfunctional voiding.  In the past she has responded to hydrodistention.  In addition she has had edema _________ with improvement.  The patient had a urethral dilation done because she thought that might help with her voiding and she has always wanted to avoid a repeat hydrodistention.  Her concern that she has come off Coumadin is very complicated, trying to get her to any form of an operative procedure.  Between September and February this year, she had multiple lower urinary tract episodes with urgency, frequency, and pain, but no signs of any infection. The patient has requested that a hydrodistention be performed.  In the past this gave her several weeks of complete relief.  MEDICATIONS: 1. Coumadin 7.5 mg on Monday, Wednesday, and Friday and 5 mg the other four    days of the week. 2. She is also on Synthroid 0.1 daily. 3. Prevacid 30 b.i.d. 4. Peri-Colace p.r.n. 5. Zoloft 50 daily. 6. OxyContin 80 t.i.d. for chronic back pain. 7. Lasix 20 mg daily. 8. Potassium supplement.  P.R.N. DRUGS: 1. Xanax 0.5 mg t.i.d. 2. Valium 10 mg at night, both use as muscle relaxants for her back pain.  Aside from interstitial cystitis the patients principal health problem is severe neck and back pain.  She is noted to have spinal stenosis.  She is scheduled to go for neurosurgical evaluation and possible surgery.  MEDICAL CONDITIONS:  Patients medical conditions  include thyroid disease and fibromyalgia.  ALLERGIES:  She is allergic to multiple medications including PENICILLIN, CODEINE, TORADOL, KEFLEX, MACRODANTIN, and other antibiotics.  FAMILY HISTORY:  Remarkable for diabetes, hypertension, heart disease, arthritis, and cancer.  Both parents are alive and in reasonable health.  PREVIOUS SURGERY:  Appendectomy, hand surgery, cesarean section, hysterectomy, augmentation, eventual removal of her implants, and her previous cystoscopy.  PHYSICAL EXAMINATION:  GENERAL:  On examination today she is a well-developed, well-nourished female with obvious lower abdominal pain.  In addition she has severe pain in the back and neck.  HEENT:  Remarkable for her neck pain, otherwise unremarkable.  LUNGS:  Clear.  HEART:  Had regular rate and rhythm.  No murmurs, thrills, gallops, rubs, or heaves.  ABDOMEN:  Soft, but she has pain across the lower abdomen.  PELVIC:  Pelvic examination shows no evidence of cystocele or enterocele, but a very tender bladder.  EXTREMITIES:  Had no cyanosis, clubbing, or edema.  IMPRESSION: 1. Interstitial cystitis. 2. History of atrial fibrillation with past history of deep venous thrombosis.  PLAN:  Admit after cystoscopy and hydrodistention.  Long term anticoagulation. DD:  03/26/01 TD:  03/26/01 Job: [id number]

## 2011-05-15 NOTE — Op Note (Signed)
NAME:  [name], [name]               ACCOUNT NO.:  [id number]   MEDICAL RECORD NO.:  [id number]          PATIENT TYPE:  INP   LOCATION:  A309                          FACILITY:  [facility]   PHYSICIAN:  Dirk Dress. Katrinka Blazing, M.D.   DATE OF BIRTH:  05-02-1961   DATE OF PROCEDURE:  09/22/2005  DATE OF DISCHARGE:                                 OPERATIVE REPORT   PREOPERATIVE DIAGNOSIS:  Perirectal abscess.   POSTOPERATIVE DIAGNOSIS:  Perirectal abscess.   PROCEDURE:  Wide excision of right brother of perirectal abscess.   SURGEON:  Dirk Dress. Katrinka Blazing, M.D.   DESCRIPTION OF PROCEDURE:  Under general anesthesia, the patient was placed  in high lithotomy position.  Her perirectal area and perianal area were  prepped and draped in a sterile field.  Cultures were taken from the abscess  cavity and right buttock.  A wide elliptical incision was made around the  abscess and extended into the deep substance of the buttock trying to remove  all inflammatory tissue.  There was some clotted tissue in the depth of the  removed  specimen. Hemostasis was achieved.  The area was packed with  Iodoform gauze.  Sterile dressing was placed.  She was awakened from  anesthesia, transferred to bed and taken to the postanesthetic care unit for  further monitoring.      Dirk Dress. Katrinka Blazing, M.D.  Electronically Signed     LCS/MEDQ  D:  09/22/2005  T:  09/23/2005  Job:  [id number]   cc:   Milus Mallick. Lodema Hong, M.D.  Fax: [phone]

## 2011-05-15 NOTE — Discharge Summary (Signed)
NAME:  [name], [name]                         ACCOUNT NO.:  [id number]   MEDICAL RECORD NO.:  [id number]                   PATIENT TYPE:  INP   LOCATION:  A302                                 FACILITY:  [facility]   PHYSICIAN:  Vania Rea, M.D.              DATE OF BIRTH:  [birth year]   DATE OF ADMISSION:  07/20/2004  DATE OF DISCHARGE:  07/22/2004                                 DISCHARGE SUMMARY   PRIMARY CARE PHYSICIAN:  Milus Mallick. Lodema Hong, M.D.   DIAGNOSES AT TIME OF TRANSFER:  1. Sepsis.  2. Cellulitis of the lip, worsening.  3. Intractable headache and neck stiffness, rule out meningitis.  4. Multiple antibiotic allergies.  5. Chronic anticoagulation.  6. History of deep vein thrombosis and embolic transient ischemic attacks.  7. History of atrial fibrillation, status post ablation therapy.  8. History of cervical disk disease, status post cervical fusion.  9. Hypothyroidism.  10.      Fibromyalgia.  11.      Status post colectomy for megacolon.   MEDICATIONS AT TIME OF DISCHARGE:  1. Levaquin 500 mg IV daily.  2. Vancomycin 100 mg IV q.12h.  3. Diazepam 5 mg p.o. t.i.d.  4. Xanax 0.5 mg p.o. q.i.d.  5. Benadryl 50 mg p.o. t.i.d.  6. OxyContin 8 mg p.o. q.12h.  7. Protonix 40 mg p.o. daily.  8. Dilaudid 2 mg IV q.2h.  9. Coumadin 7.5 mg Monday, Wednesday, Friday; 5 mg Tuesday, Thursday,     Saturday, Sunday.  Coumadin currently being held because of super     therapeutic INR, scheduled to be restarted this evening.  10.      IV fluids, normal saline with 20 mEq of potassium at 75 cc an hour.   ALLERGIES:  MULTIPLE ANTIBIOTICS.   HOSPITAL COURSE:  Please refer to history and physical dictated yesterday.  This is a fairly young African-American lady who was admitted yesterday for  swelling and inflammation of the left lower lip with evidence of cellulitis  and possible differential of a spider bite who has multiple antibiotic and  was initially being treated with  Levaquin to which vancomycin was added  yesterday as she remained persistently febrile and in persistent pain.  She  was also noted on admission to have elevated liver function tests and a  super therapeutic INR.   Today, although the patient says the pain is helped by Dilaudid, she seems  significantly worse, the swelling of her lip has extended with submandibular  lymph node swelling and some mandibular swelling is worse, stiffness of neck  and pain with flexion of the neck is worse, headache seems worse and the  patient's overall condition seems to be much worse.  She seems to be more  febrile.  [name] husband and mother are encouraging vigorously to be  transferred to another facility where infectious disease expertise is  available.  The patient feels that  her condition is coming under control and  feels that she needs to get the Dilaudid more regularly and is willing to do  anything that gets rid of her pain.   This morning's vitals:  Temperature 101.6, pulse 82, respirations 18, blood  pressure 90/58, she is saturating at 97% on room air.   LABORATORY DATA:  White count 9.7, she has never demonstrated an elevated  white count while here; hematocrit 32.5, platelets 204,000.  Sodium 134,  potassium 4.4, chloride 102, CO2 27, BUN 6, creatinine 0.9, glucose 145,  calcium 7.9.  Her INR is now 2.6.  Total bilirubin 0.6, albumin 2.9, total  protein 5.4, alk phos has decreased to 96, her AST has decreased to 36 and  her ALT has decreased to 62.   Her abdominal ultrasound done yesterday revealed a fatty liver and no pelvic  masses and no pelvic fluid.   The case has been discussed very briefly with Dr. Maurice March and Dr. Okey Dupre, the  admitting residents at [facility].  The patient is currently in the  Radiology Department getting a CAT scan of the brain and there is a  possibility she may need a lumbar puncture.  It is to be noted that the  patient has an INR of 2.6.    FOLLOWUP:  The patient will be followed up by the infectious disease service  at [facility].     ___________________________________________                                         Vania Rea, M.D.   LC/MEDQ  D:  07/22/2004  T:  07/22/2004  Job:  [id number]   cc:   Milus Mallick. Lodema Hong, M.D.  [address]  [place], [place] [address]  Fax: [phone]

## 2011-05-15 NOTE — Cardiovascular Report (Signed)
NAME:  [name], [name]                         ACCOUNT NO.:  [id number]   MEDICAL RECORD NO.:  [id number]                   PATIENT TYPE:  INP   LOCATION:  3710                                 FACILITY:  [facility]   PHYSICIAN:  Charlies Constable, M.D.                  DATE OF BIRTH:  09-Nov-1961   DATE OF PROCEDURE:  07/06/2003  DATE OF DISCHARGE:                              CARDIAC CATHETERIZATION   CLINICAL HISTORY:  The patient is 50 years old and has had previous  ablations x3 by Duke Salvia, M.D. and at [facility] for SVT and  atrial flutter.  She also has had long history of multiple medical problems.  Recently, she developed chest pain and a Cardiolite was obtained by Duke Salvia, M.D. which suggested septal ischemia so she is brought in the  hospital for evaluation with angiography.   PROCEDURE:  The procedure was performed via the right femoral artery  utilizing an arterial sheath and 6-French preformed coronary catheters.  A  front wall arterial puncture was performed and Omnipaque contrast was used.  We had difficulty injecting the right coronary artery without damping of the  pressure so we switched to a JR4 guiding catheter with side holes.  The  patient tolerated the procedure well and left the laboratory in satisfactory  condition.  She received a total of 4 mg of Versed and 50 mg of fentanyl.   RESULTS:  The aortic pressure was 91/59 with a mean of 75.  Left ventricular  pressure was 91/4.   Left main coronary artery:  Free of significant disease.   Left anterior descending artery:  Gave rise to a diagonal branch and a  septal perforator.  ________ proper were free of significant disease.   Circumflex artery:  The circumflex artery was a dominant vessel, gave rise  to marginal branch, an atrial branch, a posterolateral branch, and a  posterior descending branch.  These vessels were free of significant  disease.   Right coronary artery:  The right coronary  artery is a nondominant vessel  that supplied only two right ventricular branches.  There was some catheter  induced spasm at the proximal portion of the right coronary artery but the  vessel was otherwise free of significant disease.   LEFT VENTRICULOGRAM:  The left ventriculogram performed in the RAO  projection showed good wall motion with no areas of hypokinesis.  The  estimated ejection fraction was 60%.   CONCLUSIONS:  Normal coronary angiography and normal left ventricular wall  motion.   RECOMMENDATIONS:  Reassurance.  I will discuss with Duke Salvia, M.D.  regarding the need for further evaluation of her chest pain.  Charlies Constable, M.D.    BB/MEDQ  D:  07/06/2003  T:  07/06/2003  Job:  [id number]

## 2011-05-15 NOTE — Op Note (Signed)
NAME:  Barbara, [name]                         ACCOUNT NO.:  [id number]   MEDICAL RECORD NO.:  [id number]                   PATIENT TYPE:  INP   LOCATION:  A225                                 FACILITY:  [facility]   PHYSICIAN:  Dirk Dress. Katrinka Blazing, M.D.                DATE OF BIRTH:  [birth year]   DATE OF PROCEDURE:  DATE OF DISCHARGE:                                 OPERATIVE REPORT   PREOPERATIVE DIAGNOSES:  1. Severe colonic inertia with intractable constipation.  2. Chronic cholecystitis.   POSTOPERATIVE DIAGNOSES:  1. Severe colonic inertia with intractable constipation.  2. Chronic cholecystitis.   PROCEDURE:  1. Total abdominal colectomy.  2. Cholecystectomy.   SURGEON:  Dr. Katrinka Blazing.   DESCRIPTION OF PROCEDURE:  Under general endotracheal anesthesia, the  patient's abdomen was prepped and draped in a sterile field. A midline  incision was made. Exploration of the abdomen was unremarkable except for a  descended thickened gallbladder and a very redundant colon with a massively  dilated sigmoid and rectum. The abdomen was packed off. The right colon and  terminal ileum were mobilized along the line of Toldt. This was mobilized up  to the hepatic flexure. The gastrocolic omentum was divided between [facility]  clamps and tied with ligatures of 2-0 silk. The terminal ileum was  transected using a GIA stapler. The mesentery was divided sequentially using  Kelly clamps because of the need to have much better hemostasis. The vessels  were tied with two ligatures of 2-0 silk. This was continued over to the  splenic flexure. Using great care, the splenic flexure was taken down. The  splenocolic ligaments were taken down using the LDS stapler taking care not  to injure the spleen. Next, the left colon was mobilized down to the rectum.  The mesentery was divided sequentially between [facility] clamps and tied with 2-  0 silk. The rectum was divided immediately above the peritoneal reflection  and the  colon was passed off. A side to side ileorectal anastomosis was  performed using GIA-60 and a TA-55 staplers. The staple line was oversewn  using running 3-0 Prolene. The mesenteric defect was closed with running 2-0  Biosyn. Copious irrigation was carried out. There was no evidence of  bleeding. All operative areas were packed with dry laparotomy sponges.  Attention was then turned to the right upper quadrant. The gallbladder was  grasped with Tresa Endo clamps. The serosa was incised with electrocautery and  the colon was separated from the intrahepatic bed without difficulty. The  cystic artery was identified, it was clipped with multiple clips and  divided. Dissection was then continued down to the cystic duct. The cystic  duct was clamped with a right angled clamp and divided. The cystic duct was  tied with a ligature of 2-0 silk and then clipped with two large hemoclips.  Hemostasis in the bed was carried out. Copious irrigation was carried  out.  Inspection of the abdomen was carried out very thoroughly looking for any  potential area of bleeding or any potential oozing and none was found. The  mesenteric vessels were checked and there was no evidence of bleeding or  oozing. Irrigation was carried out again. The anastomosis was inspected and  appeared to be unremarkable. It was widely patent. The abdomen was then  closed after verifying that sponge, needle, instrument and blade counts were  correct. The fascia was closed with #1 Prolene. The subcutaneous tissue was  closed with 2-0 Biosyn. The skin was closed with staples. The patient  tolerated the procedure well. Intraoperative blood loss was 250 cc. There  was no bleeding from the incision. The dressing was clasped. She was  awakened from anesthesia, transferred to a bed and taken to the post  anesthesia care unit.                                               Dirk Dress. Katrinka Blazing, M.D.    LCS/MEDQ  D:  08/04/2002  T:  08/08/2002  Job:   [id number]   cc:   Milus Mallick. Lodema Hong, M.D.  [address]  [place]  [place] [address]  Fax: [phone]   Lionel December, M.D.

## 2011-05-15 NOTE — Group Therapy Note (Signed)
   NAME:  EUVA, RUNDELL                         ACCOUNT NO.:  [id number]   MEDICAL RECORD NO.:  [id number]                   PATIENT TYPE:  PREC   LOCATION:                                       FACILITY:  [facility]   PHYSICIAN:  Vida Roller, M.D.                DATE OF BIRTH:  11-21-1961   DATE OF PROCEDURE:  06/11/2003  DATE OF DISCHARGE:  06/08/2003                                    STRESS TEST   INDICATION:  Ms. Tenbrink is a 50 year old female with no known coronary  artery disease who had several episodes of atypical chest discomfort. She  does have a history of atrial fibrillation and flutter status post an atrial  fibrillation and atrial flutter ablation.  She also has a history of  thromboembolic disease and is on chronic Coumadin.   BASELINE DATA:  EKG shows sinus rhythm at 70 beats per minute with  nonspecific ST abnormalities.  Blood pressure is 112/70.   The patient exercised for a total of 7 minutes 28 seconds to 10.1 METS and  Bruce protocol stage III.  The patient complained of palpitations which were  correlated with PVCs on the monitor.  Maximum heart rate achieved was 160  beats per minute which was 89% of predicted maximum.  The Maximum blood  pressure was 198/78 which resolved down to 110/72 in recovery.  Her EKG  showed no ischemic changes and few PVCs.  Final images and results are  pending MD review.     Amy Mercy Riding, P.A. [facility]                     Vida Roller, M.D.    AB/MEDQ  D:  06/11/2003  T:  06/11/2003  Job:  [id number]

## 2011-05-15 NOTE — Discharge Summary (Signed)
NAME:  [name], [name]                         ACCOUNT NO.:  [id number]   MEDICAL RECORD NO.:  [id number]                   PATIENT TYPE:  INP   LOCATION:  3706                                 FACILITY:  [facility]   PHYSICIAN:  Ileana Roup, M.D.               DATE OF BIRTH:  [birth year]   DATE OF ADMISSION:  07/22/2004  DATE OF DISCHARGE:  07/29/2004                                 DISCHARGE SUMMARY   DISCHARGE DIAGNOSES:  1. Community acquired methicillin resistant Staphylococcus aureus with     abscess.  2. Hypotension.  3. Abdominal pain.  4. Chronic back pain.  5. Deconditioning.  6. History of pulmonary embolus on Coumadin.  7. History of anxiety disorder.  8. History of atrial fibrillation status post ablation.   PROCEDURES PERFORMED:  1. Ultrasound of the abdomen and pelvis for abdominal pain.  Ultrasound     showed fatty infiltrates in the liver without evidence for acute     abnormality of the abdomen, status post cholecystectomy, status post     hysterectomy, no free fluid found in the pelvis.  2. CT of the head showed no acute intracranial abnormalities.  3. MRI of the spine was negative lumbar spine MRI with and without contrast.     MRI of the thoracic spine showed good disc height and hydration     throughout.  Cord was normal size and single.  There was no sign of an     intraspinal abscess.  No paraspinous inflammatory changes.  After     contrast, there was no abnormal intraspinal enhancement.  Note is made of     a previous cervical fusion procedure which appears had been performed at     [facility].   BRIEF ADMISSION HISTORY:  [name] is a 50 year old white female with  history of chronic pain, history of a PE on Coumadin therapy, history of  atrial fibrillation status post ablation.  The patient was admitted from  outside hospital with lip abscess.  She was seen by ENT and drained and  noted to grow out community acquired MRSA.  The patient was treated with  IV  antibiotics in hospital and discharged on oral antibiotic therapy.   HOSPITAL COURSE:  PROBLEM #1 -  METHICILLIN RESISTANT STAPHYLOCOCCUS AUREUS  LIP ABSCESS:  The patient was seen by ENT.  The patient was I&D'd by ENT  with a Penrose drain placed.  The patient received a seven-day course of IV  vancomycin and was transitioned to p.o. doxycycline 14-day course 100 mg  b.i.d. and was discharged with instructions to follow up with her primary  care physician on Tuesday, August 05, 2004, to follow up for resolution of  the lip abscess.   PROBLEM #2 -  HYPOTENSION:  The patient was found to be hypotensive on  admission.  At this time the patient was on morphine PCA pump.  Discontinuation of the morphine  PCA pump saw her systolic blood pressures in  the high 80s and low 90s come back up to the 100s to 110s.  Hypotension was  thought to be relative to opioid therapy.   PROBLEM #3 -  CHRONIC PAIN:  The patient was discharged on her home pain  regimen.  She is seen in a pain clinic and will follow up with them.   PROBLEM #4 -  HISTORY OF PULMONARY EMBOLUS/DEEP VENOUS THROMBOSIS:  The  patient was placed on Coumadin therapy in the hospital and on discharge had  INR of 2.7.   PROBLEM #5 -  DECONDITIONING:  The patient adamantly denied home PT.  The  patient was seen by Child psychotherapist as well as the physician and refused even  in-house safety examination.  She says that she has plenty of help with her  family at home and this was duly noted in the chart by the social worker.   DISCHARGE MEDICATIONS:  1. Doxycycline 100 mg b.i.d.  2. OxyContin 80 mg p.o. b.i.d.  3. Coumadin 7.5 mg Monday, Wednesday, Friday and 5 mg Tuesday, Thursday,     Saturday, and Sunday.  This is her home regimen.  4. OxyContin IR 5 mg t.i.d.  5. __________ 10/20 q.h.s.  6. Valium 5 mg t.i.d.  7. Xanax 0.5 mg t.i.d.  8. Ritalin 10 mg b.i.d.  9. Aldactone 10 mg daily.  10.      Multivitamin daily.  11.       Caltrate.  12.      Prenatal vitamin daily.  13.      Glucophage 250 b.i.d.  14.      Synthroid 150 mcg daily.   The patient was discharged to home with family.  The patient is to follow up  August 05, 2004, Tuesday, at 9 a.m. with her primary care physician, Dr.  Lodema Hong, in [place], [place].  No pending tests at this time.  The  patient was discharged on oral antibiotics and is to follow up for her lip  abscess.   LABORATORY DATA:  CBC today, July 29, 2004, showed a white blood cell  count of 5.6, hemoglobin 11.2, platelets 279.  Her INR on discharge was 2.7.  Blood cultures have been negative x5 days.      Darrol Jump, MD                           Ileana Roup, M.D.    SD/MEDQ  D:  07/29/2004  T:  07/29/2004  Job:  [id number]   cc:   Milus Mallick. Lodema Hong, M.D.  [address]  [place], [place] [address]  Fax: [phone]

## 2011-05-15 NOTE — Discharge Summary (Signed)
NAME:  [name], [name]                         ACCOUNT NO.:  [id number]   MEDICAL RECORD NO.:  [id number]                   PATIENT TYPE:  INP   LOCATION:  3710                                 FACILITY:  [facility]   PHYSICIAN:  Duke Salvia, M.D.               DATE OF BIRTH:  [birth year]   DATE OF ADMISSION:  07/03/2003  DATE OF DISCHARGE:  07/10/2003                                 DISCHARGE SUMMARY   PRIMARY DIAGNOSIS:  Chest pain.   SECONDARY DIAGNOSES:  1. 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  2. History of pulmonary emboli in 1997 and a transient ischemic attack.  3. Atrial fibrillation.  4. Hypothyroidism.  5. Status post breast implants with subsequent removal.  6. Dysrhythmia as mentioned as mentioned above.  7. Chronic pain syndrome.  8. _________ in cervical spine.  9. Chronic constipation.  10.      Status post atrial flutter ablation.   HISTORY OF PRESENT ILLNESS:  This is a 50 year old female with an episode of  sharp chest pain which started in her neck, radiates to her chest and left  arm.  She drove to [facility] but by the time she arrived, the pain had  subsided.  She went home.  Lately, she feels that ___________ has been  standing against her chest.  It is episodic but accompanied by sharp chest  pain.  It happens 6-10 times a month.  There is no precipitating factor.  She has no concurrent symptoms such as dyspnea, diaphoresis, nausea or  vomiting.  She was seen by Dr. Graciela Husbands and underwent a Cardiolite stress  study.  Her ejection fraction was 67%.  The study showed anteroseptal  hypokinesis at an area of reversible deficit.  Of note, the patient has an  extensive history of cardiac dysrhythmias.  She has had an ablation of the  right atrium for atrial flutter with a rapid ventricular rate in 1996.  Subsequent ablation in the right atrium for atrial fibrillation with rapid  ventricular rate in 1996 and a subsequent ablation of the left atrium in  1997  with a pulmonary emboli post procedure.  The patient was now brought to  [facility] for reversal of her Coumadin and a cardiac catheterization.  The  patient underwent a cardiac catheterization after her INR normalized.  It  showed normal coronaries with an EF of approximately 60%.  She was placed on  a heparin drip and Coumadin was restarted.  Once her INR was 1.9, the  patient opted to go home on Lovenox.  She was then discharged to home on the  following medications.  Oxycodone, OxyContin 60 b.i.d., Oxy-IR 5 p.r.n.,  Synthroid 150 mcg daily, Valium 5 q.i.d., Coumadin 7.5 nightly, multivitamin  a day, magnesium oxide 400 2 tabs daily, quinine sulfate 325 mg nightly,  Lasix and potassium as needed, Lovenox 100 mg subcu every 12 hours until her  INR  was greater than 2.0 for 24 hours.  Low fat, low salt, low cholesterol  diet.  She is to call if she develops a lump or any drainage in her groin.  She was scheduled for the Coumadin Clinic at the [facility] at  11:00 a.m. on Wednesday, July 11, 2003.  She was to see a Chief Technology Officer at [facility] on July 24, 2003 at 2:00 p.m. and Dr. Graciela Husbands in 6-8  weeks in the [place] office and the office is to call to schedule an  appointment.     Chinita Pester, C.R.N.P. [facility]                 Duke Salvia, M.D.    DS/MEDQ  D:  07/10/2003  T:  07/11/2003  Job:  [id number]   cc:   Milus Mallick. Lodema Hong, M.D.  [address]  [place], [place] [address]  Fax: [phone]    cc:   Milus Mallick. Lodema Hong, M.D.  [address]  [place], [place] [address]  Fax: [phone]

## 2011-05-18 ENCOUNTER — Ambulatory Visit: Payer: Medicare Other | Admitting: Family Medicine

## 2011-06-12 ENCOUNTER — Telehealth: Payer: Self-pay | Admitting: Family Medicine

## 2011-06-12 DIAGNOSIS — R7301 Impaired fasting glucose: Secondary | ICD-10-CM

## 2011-06-12 DIAGNOSIS — R5381 Other malaise: Secondary | ICD-10-CM

## 2011-06-12 DIAGNOSIS — D509 Iron deficiency anemia, unspecified: Secondary | ICD-10-CM

## 2011-06-12 DIAGNOSIS — E785 Hyperlipidemia, unspecified: Secondary | ICD-10-CM

## 2011-06-12 DIAGNOSIS — E039 Hypothyroidism, unspecified: Secondary | ICD-10-CM

## 2011-06-12 DIAGNOSIS — R5383 Other fatigue: Secondary | ICD-10-CM

## 2011-06-12 MED ORDER — CYCLOBENZAPRINE HCL 10 MG PO TABS: 10.0000 mg | ORAL_TABLET | Freq: Three times a day (TID) | ORAL | Status: DC

## 2011-06-12 MED ORDER — SPIRONOLACTONE 25 MG PO TABS: 25.0000 mg | ORAL_TABLET | Freq: Two times a day (BID) | ORAL | Status: DC

## 2011-06-12 MED ORDER — PRAVASTATIN SODIUM 40 MG PO TABS: 40.0000 mg | ORAL_TABLET | Freq: Every evening | ORAL | Status: DC

## 2011-06-12 MED ORDER — LEVOTHYROXINE SODIUM 100 MCG PO TABS: 100.0000 ug | ORAL_TABLET | Freq: Every day | ORAL | Status: DC

## 2011-06-12 NOTE — Telephone Encounter (Signed)
Fasting lipid, hepatic , hBA1C , tsh and chem 7, pls

## 2011-06-12 NOTE — Telephone Encounter (Signed)
What labs would you like ordered on this patient?

## 2011-06-12 NOTE — Telephone Encounter (Signed)
meds sent as requested and blood work order faxed to lab

## 2011-06-18 LAB — BASIC METABOLIC PANEL
BUN: 16 mg/dL (ref 6–23)
Creat: 0.94 mg/dL (ref 0.50–1.10)
Potassium: 4.6 mEq/L (ref 3.5–5.3)

## 2011-06-18 LAB — HEPATIC FUNCTION PANEL
ALT: 12 U/L (ref 0–35)
Bilirubin, Direct: 0.1 mg/dL (ref 0.0–0.3)

## 2011-06-18 LAB — HEMOGLOBIN A1C
Hgb A1c MFr Bld: 5.8 % — ABNORMAL HIGH (ref <5.7)
Mean Plasma Glucose: 120 mg/dL — ABNORMAL HIGH (ref <117)

## 2011-06-18 LAB — LIPID PANEL
Cholesterol: 222 mg/dL — ABNORMAL HIGH (ref 0–200)
LDL Cholesterol: 135 mg/dL — ABNORMAL HIGH (ref 0–99)
Total CHOL/HDL Ratio: 4.8 Ratio
VLDL: 41 mg/dL — ABNORMAL HIGH (ref 0–40)

## 2011-06-19 MED ORDER — PRAVASTATIN SODIUM 80 MG PO TABS: 80.0000 mg | ORAL_TABLET | Freq: Every evening | ORAL | Status: DC

## 2011-06-19 NOTE — Telephone Encounter (Signed)
Patient aware, med sent 

## 2011-06-19 NOTE — Telephone Encounter (Signed)
Addended by: Adella Hare B on: 06/19/2011 02:07 PM   Modules accepted: Orders, Medications

## 2011-06-28 DIAGNOSIS — Z8742 Personal history of other diseases of the female genital tract: Secondary | ICD-10-CM

## 2011-06-28 HISTORY — DX: Personal history of other diseases of the female genital tract: Z87.42

## 2011-07-16 ENCOUNTER — Telehealth: Payer: Self-pay | Admitting: Family Medicine

## 2011-07-17 ENCOUNTER — Other Ambulatory Visit: Payer: Self-pay

## 2011-07-17 MED ORDER — OMEPRAZOLE 40 MG PO CPDR: 40.0000 mg | DELAYED_RELEASE_CAPSULE | Freq: Every day | ORAL | Status: DC

## 2011-07-17 NOTE — Telephone Encounter (Signed)
Spoke with her and she told me everything that was going on and I told her we were all thinking of her and to let us know when she found out

## 2011-07-17 NOTE — Telephone Encounter (Signed)
Noted, pls let her know she is in my thoughts

## 2011-07-24 HISTORY — PX: BILATERAL SALPINGOOPHORECTOMY: SHX1223

## 2011-07-30 ENCOUNTER — Ambulatory Visit: Payer: Medicare Other | Admitting: Family Medicine

## 2011-08-05 ENCOUNTER — Telehealth: Payer: Self-pay | Admitting: Family Medicine

## 2011-08-05 NOTE — Telephone Encounter (Signed)
Daughter, [name], called about [name] , her mom, stating "[facility] to [facility]" went on to describe surgery with complications involving ICU admission. States mother is being discharged home this week, she is unclear as to what is going on and wants to know if I "knew a good Careers adviser" who I would recommend to be responsible for her mother's care. I explained that transfer of care would have to be done between the surgeons involved, since she still has open wounds and the surgery is recent.I further told her I had no specific recommendations. I advised that she set up a conference with the treating physicians to discuss this further, she agreed

## 2011-08-26 ENCOUNTER — Telehealth: Payer: Self-pay | Admitting: Family Medicine

## 2011-08-26 NOTE — Telephone Encounter (Signed)
Please Tiffany at [phone] if any questions

## 2011-08-27 ENCOUNTER — Encounter: Payer: Self-pay | Admitting: Family Medicine

## 2011-08-28 ENCOUNTER — Ambulatory Visit (INDEPENDENT_AMBULATORY_CARE_PROVIDER_SITE_OTHER): Payer: Medicare Other | Admitting: Family Medicine

## 2011-08-28 ENCOUNTER — Encounter: Payer: Self-pay | Admitting: Family Medicine

## 2011-08-28 DIAGNOSIS — L988 Other specified disorders of the skin and subcutaneous tissue: Secondary | ICD-10-CM | POA: Insufficient documentation

## 2011-08-28 DIAGNOSIS — R5383 Other fatigue: Secondary | ICD-10-CM

## 2011-08-28 DIAGNOSIS — M791 Myalgia, unspecified site: Secondary | ICD-10-CM

## 2011-08-28 DIAGNOSIS — E785 Hyperlipidemia, unspecified: Secondary | ICD-10-CM

## 2011-08-28 DIAGNOSIS — R5381 Other malaise: Secondary | ICD-10-CM

## 2011-08-28 DIAGNOSIS — N83209 Unspecified ovarian cyst, unspecified side: Secondary | ICD-10-CM

## 2011-08-28 DIAGNOSIS — N289 Disorder of kidney and ureter, unspecified: Secondary | ICD-10-CM

## 2011-08-28 DIAGNOSIS — IMO0001 Reserved for inherently not codable concepts without codable children: Secondary | ICD-10-CM

## 2011-08-28 DIAGNOSIS — Z139 Encounter for screening, unspecified: Secondary | ICD-10-CM

## 2011-08-28 DIAGNOSIS — E039 Hypothyroidism, unspecified: Secondary | ICD-10-CM

## 2011-08-28 DIAGNOSIS — E669 Obesity, unspecified: Secondary | ICD-10-CM

## 2011-08-28 DIAGNOSIS — L089 Local infection of the skin and subcutaneous tissue, unspecified: Secondary | ICD-10-CM

## 2011-08-28 HISTORY — DX: Disorder of kidney and ureter, unspecified: N28.9

## 2011-08-28 LAB — HEPATIC FUNCTION PANEL
ALT: 15 U/L (ref 0–35)
AST: 14 U/L (ref 0–37)
Albumin: 4 g/dL (ref 3.5–5.2)
Bilirubin, Direct: 0.1 mg/dL (ref 0.0–0.3)

## 2011-08-28 LAB — BASIC METABOLIC PANEL
BUN: 17 mg/dL (ref 6–23)
CO2: 28 mEq/L (ref 19–32)
Chloride: 99 mEq/L (ref 96–112)
Creat: 1.49 mg/dL — ABNORMAL HIGH (ref 0.50–1.10)

## 2011-08-28 LAB — CBC WITH DIFFERENTIAL/PLATELET
Basophils Absolute: 0 10*3/uL (ref 0.0–0.1)
Eosinophils Relative: 2 % (ref 0–5)
HCT: 35.3 % — ABNORMAL LOW (ref 36.0–46.0)
Lymphocytes Relative: 24 % (ref 12–46)
Lymphs Abs: 2.1 10*3/uL (ref 0.7–4.0)
MCV: 89.1 fL (ref 78.0–100.0)
Monocytes Absolute: 0.6 10*3/uL (ref 0.1–1.0)
RDW: 15 % (ref 11.5–15.5)
WBC: 8.8 10*3/uL (ref 4.0–10.5)

## 2011-08-28 LAB — TSH: TSH: 9.744 u[IU]/mL — ABNORMAL HIGH (ref 0.350–4.500)

## 2011-08-28 NOTE — Telephone Encounter (Signed)
I spoke with the accepting doctor at [facility]  Directly , he does affirm that he is willing to follow the wound, will be out of office next week , but his nurse Gigi Gin will call the family for details as to her condition , in an attempt topo schedule appropriately. Daughter is aware, referral will be put in.  As far as nephrology is concerned, an attempt to get an appt at [facility] is first choice, if the appt is too far out , then I will find a local nephrologist,daughter is aware and agrees

## 2011-08-28 NOTE — Progress Notes (Signed)
  Subjective:    Patient ID: [name], female    DOB: 1961/01/23, 50 y.o.   MRN: [id number]  HPI Pt in for f/u following surgery at [facility] on 07/24/2011 for surgery on an ovarian mass during which both ovaries were removed, and the fallopian tubes, family reports no pathology was sent on the mass.Unfortunately there were major complications with the procedure, had emergency surgery, on 07/30/2011 with general surgery working on her intestines. On 08/02/2011 reportedly had re perforation of the bowel, and states she was told she was too high risk for rept surgery and is now being managed as an open wound. 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 Originally d/c 08/10 and had to be urgently readmitted on 08/09/2011. Stayed in until 08/24/2011  She was septic positive for e coli, and oxacillin resistant  Staph.She became non responsive. Pt and her daughter are in today. Daughter has identified an advanced Secretary/administrator at [facility] who wil evaluate her as an out pt.Dr Wyvonne Lenz fellow at [facility] on trauma team reportedly made the contact and spoke with Dr Toy Cookey at [facility] and he was told he would see the pt as an out pt.Appt. She is supposed to see nephrology at [facility] at  d/c  No appt given , had acute renal failure.Does not want to go back to [facility] at all if possible, the reports per daughter is that pt received vancomycin for several days without monitoring levels and that she developed acute renal failure which has subsequently improved, but nearly warranted dialysis. The need for nephrology involvement on an ongoing basis as outpatient is documented on the d/c summary. Currently pt c/o fatigue, states "everything I eat goes straight through me", very scared about her health, and upset about the open wound she now has with colostomy bag. Concerned that she is having shortness of breath at times, concerned about occasional palpitations, and also wants to know the status of her  thyroid replacement since she has intestinal hurry     Review of Systems Denies sinus pressure, nasal congestion, ear pain or sore throat. Denies chest congestion, productive cough or wheezing. Denies chest pains,  and leg swelling C/o diarrhea   Denies dysuria, frequency,  or incontinence. C/o generalized pain and weakness from prolonged hospitalization and ill health. Denies  seizures, numbness, or tingling.  Denies skin break down or rash.       Objective:   Physical Exam  Patient alert and ill appearing and in no cardiopulmonary distress.  HEENT: No facial asymmetry, EOMI, no sinus tenderness,  oropharynx pink and moist.  Neck adequate ROM, no adenopathy.  Chest: Clear to auscultation bilaterally.  CVS: S1, S2 no murmurs, no S3.  ABD: Open entero atmospheric  fistula  Which is functional liquid stool in bag.  Ext: No edema  MS: decreased ROM spine  Skin: Intact, no ulcerations or rash noted.  Psych: Good eye contact, flat l affect. Memory intact  depressed appearing.  CNS: CN 2-12 intact, power,  normal throughout.       Assessment & Plan:

## 2011-08-28 NOTE — Telephone Encounter (Signed)
Called [facility] @[phone] and had to leave a message for them to call back to schedule the appointment

## 2011-08-28 NOTE — Patient Instructions (Addendum)
F/u i n 4 weeks.  You will be referred to [facility] medical center per your request after I speak with the surgeon, Dr Jacquenette Shone, who you report will accept you.  I will try to set up an appointment with a nephrologist, again at [facility] per your request .  Labs will be drawn today, you will be contacted with the results .  If you continue to feel weak ,or you develop fever or chills, you need to go to the nearest  emergency room for evaluation.   No changes in your medication are being made at this time

## 2011-08-29 ENCOUNTER — Emergency Department (HOSPITAL_COMMUNITY): Payer: Medicare Other

## 2011-08-29 ENCOUNTER — Inpatient Hospital Stay (HOSPITAL_COMMUNITY)
Admission: EM | Admit: 2011-08-29 | Discharge: 2011-09-03 | DRG: 552 | Disposition: A | Discharge status: Home Health | Payer: Medicare Other | Attending: Internal Medicine | Admitting: Internal Medicine

## 2011-08-29 ENCOUNTER — Encounter (HOSPITAL_COMMUNITY): Payer: Self-pay

## 2011-08-29 DIAGNOSIS — E669 Obesity, unspecified: Secondary | ICD-10-CM

## 2011-08-29 DIAGNOSIS — M549 Dorsalgia, unspecified: Secondary | ICD-10-CM

## 2011-08-29 DIAGNOSIS — Z8719 Personal history of other diseases of the digestive system: Secondary | ICD-10-CM

## 2011-08-29 DIAGNOSIS — N3000 Acute cystitis without hematuria: Secondary | ICD-10-CM

## 2011-08-29 DIAGNOSIS — T8130XA Disruption of wound, unspecified, initial encounter: Secondary | ICD-10-CM

## 2011-08-29 DIAGNOSIS — C50919 Malignant neoplasm of unspecified site of unspecified female breast: Secondary | ICD-10-CM

## 2011-08-29 DIAGNOSIS — Z8679 Personal history of other diseases of the circulatory system: Secondary | ICD-10-CM

## 2011-08-29 DIAGNOSIS — E785 Hyperlipidemia, unspecified: Secondary | ICD-10-CM

## 2011-08-29 DIAGNOSIS — I2699 Other pulmonary embolism without acute cor pulmonale: Secondary | ICD-10-CM

## 2011-08-29 DIAGNOSIS — L988 Other specified disorders of the skin and subcutaneous tissue: Secondary | ICD-10-CM

## 2011-08-29 DIAGNOSIS — R000 Tachycardia, unspecified: Secondary | ICD-10-CM

## 2011-08-29 DIAGNOSIS — R7301 Impaired fasting glucose: Secondary | ICD-10-CM

## 2011-08-29 DIAGNOSIS — M545 Low back pain, unspecified: Principal | ICD-10-CM | POA: Diagnosis present

## 2011-08-29 DIAGNOSIS — R5383 Other fatigue: Secondary | ICD-10-CM

## 2011-08-29 DIAGNOSIS — N83209 Unspecified ovarian cyst, unspecified side: Secondary | ICD-10-CM

## 2011-08-29 DIAGNOSIS — M899 Disorder of bone, unspecified: Secondary | ICD-10-CM

## 2011-08-29 DIAGNOSIS — M542 Cervicalgia: Secondary | ICD-10-CM

## 2011-08-29 DIAGNOSIS — Z4801 Encounter for change or removal of surgical wound dressing: Secondary | ICD-10-CM

## 2011-08-29 DIAGNOSIS — E039 Hypothyroidism, unspecified: Secondary | ICD-10-CM

## 2011-08-29 DIAGNOSIS — Z86718 Personal history of other venous thrombosis and embolism: Secondary | ICD-10-CM

## 2011-08-29 DIAGNOSIS — T81321A Disruption or dehiscence of closure of internal operation (surgical) wound of abdominal wall muscle or fascia, initial encounter: Secondary | ICD-10-CM

## 2011-08-29 DIAGNOSIS — F411 Generalized anxiety disorder: Secondary | ICD-10-CM

## 2011-08-29 DIAGNOSIS — F329 Major depressive disorder, single episode, unspecified: Secondary | ICD-10-CM

## 2011-08-29 DIAGNOSIS — N289 Disorder of kidney and ureter, unspecified: Secondary | ICD-10-CM

## 2011-08-29 DIAGNOSIS — N3010 Interstitial cystitis (chronic) without hematuria: Secondary | ICD-10-CM

## 2011-08-29 DIAGNOSIS — Z853 Personal history of malignant neoplasm of breast: Secondary | ICD-10-CM

## 2011-08-29 DIAGNOSIS — F3289 Other specified depressive episodes: Secondary | ICD-10-CM

## 2011-08-29 DIAGNOSIS — D509 Iron deficiency anemia, unspecified: Secondary | ICD-10-CM

## 2011-08-29 DIAGNOSIS — IMO0001 Reserved for inherently not codable concepts without codable children: Secondary | ICD-10-CM

## 2011-08-29 DIAGNOSIS — G5600 Carpal tunnel syndrome, unspecified upper limb: Secondary | ICD-10-CM

## 2011-08-29 DIAGNOSIS — R5381 Other malaise: Secondary | ICD-10-CM

## 2011-08-29 HISTORY — DX: Disorder of thyroid, unspecified: E07.9

## 2011-08-29 HISTORY — DX: Malignant (primary) neoplasm, unspecified: C80.1

## 2011-08-29 HISTORY — DX: Pneumonia, unspecified organism: J18.9

## 2011-08-29 HISTORY — DX: Gastro-esophageal reflux disease without esophagitis: K21.9

## 2011-08-29 HISTORY — DX: Anxiety disorder, unspecified: F41.9

## 2011-08-29 LAB — URINALYSIS, ROUTINE W REFLEX MICROSCOPIC
Glucose, UA: NEGATIVE mg/dL
Ketones, ur: NEGATIVE mg/dL
Leukocytes, UA: NEGATIVE
Protein, ur: NEGATIVE mg/dL
Urobilinogen, UA: 0.2 mg/dL (ref 0.0–1.0)

## 2011-08-29 LAB — COMPREHENSIVE METABOLIC PANEL
Alkaline Phosphatase: 109 U/L (ref 39–117)
BUN: 14 mg/dL (ref 6–23)
CO2: 23 mEq/L (ref 19–32)
Chloride: 101 mEq/L (ref 96–112)
Creatinine, Ser: 1.3 mg/dL — ABNORMAL HIGH (ref 0.50–1.10)
GFR calc non Af Amer: 44 mL/min — ABNORMAL LOW (ref >60)
Glucose, Bld: 102 mg/dL — ABNORMAL HIGH (ref 70–99)
Potassium: 3.9 mEq/L (ref 3.5–5.1)
Total Bilirubin: 0.4 mg/dL (ref 0.3–1.2)

## 2011-08-29 LAB — URINE MICROSCOPIC-ADD ON

## 2011-08-29 LAB — CBC
HCT: 33.4 % — ABNORMAL LOW (ref 36.0–46.0)
Hemoglobin: 10.7 g/dL — ABNORMAL LOW (ref 12.0–15.0)
MCV: 87.7 fL (ref 78.0–100.0)
RBC: 3.81 MIL/uL — ABNORMAL LOW (ref 3.87–5.11)
WBC: 10.3 10*3/uL (ref 4.0–10.5)

## 2011-08-29 LAB — RETICULOCYTES
RBC.: 3.84 MIL/uL — ABNORMAL LOW (ref 3.87–5.11)
Retic Ct Pct: 1.4 % (ref 0.4–3.1)

## 2011-08-29 MED ORDER — SIMVASTATIN 20 MG PO TABS
40.0000 mg | ORAL_TABLET | Freq: Every day | ORAL | Status: DC
  Administered 2011-08-30 – 2011-09-02 (×4): 40 mg via ORAL
  Filled 2011-08-29 (×4): qty 2

## 2011-08-29 MED ORDER — ENOXAPARIN SODIUM 100 MG/ML ~~LOC~~ SOLN: 1.0000 mg/kg | Freq: Two times a day (BID) | SUBCUTANEOUS | Status: DC

## 2011-08-29 MED ORDER — MORPHINE SULFATE 4 MG/ML IJ SOLN
4.0000 mg | INTRAMUSCULAR | Status: DC | PRN
  Administered 2011-08-29: 4 mg via INTRAVENOUS
  Filled 2011-08-29: qty 1

## 2011-08-29 MED ORDER — SODIUM CHLORIDE 0.9 % IV SOLN
Freq: Once | INTRAVENOUS | Status: AC
  Administered 2011-08-29: 14:00:00 via INTRAVENOUS

## 2011-08-29 MED ORDER — ACETAMINOPHEN 650 MG RE SUPP: 650.0000 mg | Freq: Four times a day (QID) | RECTAL | Status: DC | PRN

## 2011-08-29 MED ORDER — MORPHINE SULFATE 2 MG/ML IJ SOLN
2.0000 mg | INTRAMUSCULAR | Status: DC | PRN
  Administered 2011-08-29 – 2011-08-30 (×3): 2 mg via INTRAVENOUS
  Filled 2011-08-29 (×3): qty 1

## 2011-08-29 MED ORDER — TECHNETIUM TO 99M ALBUMIN AGGREGATED
5.0000 | Freq: Once | INTRAVENOUS | Status: AC | PRN
Start: 2011-08-29 — End: 2011-08-29
  Administered 2011-08-29: 5 via INTRAVENOUS

## 2011-08-29 MED ORDER — ONDANSETRON HCL 4 MG/2ML IJ SOLN
4.0000 mg | Freq: Four times a day (QID) | INTRAMUSCULAR | Status: DC | PRN
  Administered 2011-08-29: 4 mg via INTRAVENOUS
  Filled 2011-08-29: qty 2

## 2011-08-29 MED ORDER — SODIUM CHLORIDE 0.9 % IV BOLUS (SEPSIS)
1000.0000 mL | Freq: Once | INTRAVENOUS | Status: AC
  Administered 2011-08-29: 1000 mL via INTRAVENOUS

## 2011-08-29 MED ORDER — CYCLOBENZAPRINE HCL 10 MG PO TABS
10.0000 mg | ORAL_TABLET | Freq: Three times a day (TID) | ORAL | Status: DC | PRN
  Administered 2011-08-29 – 2011-09-01 (×4): 10 mg via ORAL
  Filled 2011-08-29 (×3): qty 1

## 2011-08-29 MED ORDER — PANTOPRAZOLE SODIUM 40 MG PO TBEC
40.0000 mg | DELAYED_RELEASE_TABLET | Freq: Every day | ORAL | Status: DC
  Administered 2011-08-29 – 2011-09-02 (×4): 40 mg via ORAL
  Filled 2011-08-29 (×4): qty 1

## 2011-08-29 MED ORDER — HYDROCODONE-ACETAMINOPHEN 10-325 MG PO TABS
1.0000 | ORAL_TABLET | ORAL | Status: DC | PRN
  Administered 2011-08-31 – 2011-09-02 (×6): 1 via ORAL
  Filled 2011-08-29 (×7): qty 1

## 2011-08-29 MED ORDER — XENON XE 133 GAS
10.0000 | GAS_FOR_INHALATION | Freq: Once | RESPIRATORY_TRACT | Status: AC | PRN
  Administered 2011-08-29: 13.5 via RESPIRATORY_TRACT

## 2011-08-29 MED ORDER — ONDANSETRON HCL 4 MG PO TABS: 4.0000 mg | ORAL_TABLET | Freq: Four times a day (QID) | ORAL | Status: DC | PRN

## 2011-08-29 MED ORDER — ACETAMINOPHEN 325 MG PO TABS: 650.0000 mg | ORAL_TABLET | Freq: Four times a day (QID) | ORAL | Status: DC | PRN

## 2011-08-29 MED ORDER — CYCLOBENZAPRINE HCL 10 MG PO TABS
ORAL_TABLET | ORAL | Status: AC
  Administered 2011-08-31: 10 mg via ORAL
  Filled 2011-08-29: qty 1

## 2011-08-29 MED ORDER — LEVOTHYROXINE SODIUM 100 MCG PO TABS
100.0000 ug | ORAL_TABLET | Freq: Every day | ORAL | Status: DC
  Administered 2011-08-30 – 2011-09-03 (×5): 100 ug via ORAL
  Filled 2011-08-29 (×6): qty 1

## 2011-08-29 MED ORDER — SODIUM CHLORIDE 0.45 % IV SOLN
INTRAVENOUS | Status: AC
  Administered 2011-08-29 – 2011-08-30 (×2): via INTRAVENOUS

## 2011-08-29 MED ORDER — ALPRAZOLAM 0.5 MG PO TABS
1.0000 mg | ORAL_TABLET | Freq: Three times a day (TID) | ORAL | Status: DC | PRN
  Administered 2011-08-30: 1 mg via ORAL
  Filled 2011-08-29 (×2): qty 1

## 2011-08-29 MED ORDER — ALBUTEROL SULFATE (5 MG/ML) 0.5% IN NEBU: 2.5000 mg | INHALATION_SOLUTION | RESPIRATORY_TRACT | Status: DC | PRN

## 2011-08-29 MED ORDER — ENOXAPARIN SODIUM 80 MG/0.8ML ~~LOC~~ SOLN
80.0000 mg | Freq: Once | SUBCUTANEOUS | Status: AC
  Administered 2011-08-29: 80 mg via SUBCUTANEOUS
  Filled 2011-08-29: qty 0.8

## 2011-08-29 MED ORDER — ONDANSETRON HCL 4 MG/2ML IJ SOLN
4.0000 mg | Freq: Four times a day (QID) | INTRAMUSCULAR | Status: DC | PRN
  Administered 2011-08-29 – 2011-09-03 (×3): 4 mg via INTRAVENOUS
  Filled 2011-08-29 (×3): qty 2

## 2011-08-29 MED ORDER — HYDROMORPHONE HCL 1 MG/ML IJ SOLN
1.0000 mg | Freq: Once | INTRAMUSCULAR | Status: AC
  Administered 2011-08-29: 1 mg via INTRAVENOUS
  Filled 2011-08-29: qty 1

## 2011-08-29 MED ORDER — CIPROFLOXACIN IN D5W 400 MG/200ML IV SOLN
400.0000 mg | Freq: Once | INTRAVENOUS | Status: DC
  Filled 2011-08-29: qty 200

## 2011-08-29 NOTE — ED Notes (Signed)
Dr Lynelle Doctor informed of pt arrival. Awaiting MD eval.

## 2011-08-29 NOTE — ED Notes (Signed)
Pt c/o increased pain. Dr Lynelle Doctor notified.

## 2011-08-29 NOTE — ED Provider Notes (Signed)
History     CSN: [id number] Arrival date & time: 08/29/2011 12:57 PM  Chief Complaint  Patient presents with  . Flank Pain   Patient is a 50 y.o. female presenting with flank pain.  Flank Pain  Pt has complicated recent surgical hx. She had a partial hysterectomy done at [facility] on 7/27 for a mass on an ovary and ended up also having an exploratory laparotomy. On 7/31 she started having stool come out of her umbilicus and had another exploratory lap done on 8/2 and was found to have perforated small bowel and colon (had had prior partial colectomy). Her abd was left open. She was discharged on 8/10 and went back on 8/12 for bleeding and fever of 102.7 and on 8/13 developed sepsis with E Coli and oxacillin resistant staph and was in the ICU.  She also developed pneumonia. She was on vancomycin 1.5 grams TID and went into renal failure with creatine rising from 0.7 to 3.7 on 8/16 and she was felt to have ATN from the vancomycin. She was discharge on 8/27 with baseline creat of 1.9 Daughter reports weight loss of 30 pounds. Yesterday shse felt weak and slept all day. Last night she acutely developed pain in her left lower flank that radiates into her LUQ and RUQ, the pain is pleuritic without having SOB. She had a low grade temp of 99.3 without cough. States she was seen yesterday by Dr Lodema Hong who has talked to a gastroenterology speciaity surgeon at [facility], Dr Toy Cookey and he has agreed to see her as an outpateint.  Pt states she was on coumadin for a PE she had in 1997 (with negative genetic markers) that developed after getting ablation for SVT. She also has a greenfield filter. States they had discussed putting her back on coumadin but were fearful to do that until her abd wound has healed. Her coumadin was stopped in 2006 when she was treated for breast cancer (left).   Past Medical History  Diagnosis Date  . SVT (supraventricular tachycardia)   . Hypercholesterolemia   . Bilateral  ovarian cysts   . Perforation bowel   . PE (pulmonary embolism)   . Thyroid disease   . GERD (gastroesophageal reflux disease)   . Cancer   . Sepsis   . E coli infection   . Acute renal failure   . Anxiety   . Pneumonia   Breast cancer   Past Surgical History  Procedure Date  . Ablasion   . Vena cava filter placement   . Abdominal hysterectomy   . Hernia repair   . Colon surgery   . Bowel resection     History reviewed. No pertinent family history.  History  Substance Use Topics  . Smoking status: Never Smoker   . Smokeless tobacco: Not on file  . Alcohol Use: No  lives with husband  OB History    Grav Para Term Preterm Abortions TAB SAB Ect Mult Living                  Review of Systems  Genitourinary: Positive for flank pain.  All other systems reviewed and are negative.    Physical Exam  BP 108/52  Pulse 110  Temp 99.3 F (37.4 C)  Resp 20  Ht 5\' 4"  (1.626 m)  Wt 199 lb (90.266 kg)  BMI 34.16 kg/m2  SpO2 100%  Physical Exam  Vitals reviewed. Constitutional: She is oriented to person, place, and time. She appears well-developed  and well-nourished.  HENT:  Head: Normocephalic.  Eyes: Conjunctivae and EOM are normal. Pupils are equal, round, and reactive to light.  Neck: Normal range of motion. Neck supple.  Cardiovascular: Normal rate and regular rhythm.   Pulmonary/Chest: Effort normal and breath sounds normal.  Abdominal:       Pt has open abd wound in lower abd, she indicates pain in her lower left flank area that hurts when she moves and when she breathes deeply.   Musculoskeletal: Normal range of motion.  Neurological: She is alert and oriented to person, place, and time.  Psychiatric: Her speech is normal. Her affect is blunt.    ED Course  Procedures  MDM  Results for orders placed during the hospital encounter of 08/29/11  CBC      Component Value Range   WBC 10.3  4.0 - 10.5 (K/uL)   RBC 3.81 (*) 3.87 - 5.11 (MIL/uL)    Hemoglobin 10.7 (*) 12.0 - 15.0 (g/dL)   HCT 40.9 (*) 81.1 - 46.0 (%)   MCV 87.7  78.0 - 100.0 (fL)   MCH 28.1  26.0 - 34.0 (pg)   MCHC 32.0  30.0 - 36.0 (g/dL)   RDW 91.4  78.2 - 95.6 (%)   Platelets 370  150 - 400 (K/uL)  COMPREHENSIVE METABOLIC PANEL      Component Value Range   Sodium 135  135 - 145 (mEq/L)   Potassium 3.9  3.5 - 5.1 (mEq/L)   Chloride 101  96 - 112 (mEq/L)   CO2 23  19 - 32 (mEq/L)   Glucose, Bld 102 (*) 70 - 99 (mg/dL)   BUN 14  6 - 23 (mg/dL)   Creatinine, Ser 2.13 (*) 0.50 - 1.10 (mg/dL)   Calcium 9.1  8.4 - 08.6 (mg/dL)   Total Protein 6.6  6.0 - 8.3 (g/dL)   Albumin 3.1 (*) 3.5 - 5.2 (g/dL)   AST 12  0 - 37 (U/L)   ALT 11  0 - 35 (U/L)   Alkaline Phosphatase 109  39 - 117 (U/L)   Total Bilirubin 0.4  0.3 - 1.2 (mg/dL)   GFR calc non Af Amer 44 (*) >60 (mL/min)   GFR calc Af Amer 53 (*) >60 (mL/min)   Renal insuffic, mild anemia  No results found.  Ct Abdomen Pelvis Wo Contrast  08/29/2011  *RADIOLOGY REPORT*  Clinical Data: Left flank pain.  Question stone.  CT ABDOMEN AND PELVIS WITHOUT CONTRAST  Technique:  Multidetector CT imaging of the abdomen and pelvis was performed following the standard protocol without intravenous contrast.  Comparison: Abdomen pelvis CT 12/08/2009  Findings: The imaged portion of the lower chest demonstrates left mastectomy with TRAM reconstruction and a right breast implant, partially visualized.  There is atelectasis and/or consolidation in both lower lobes, left greater than right, and there are linear bands of atelectasis or scarring in the right middle lobe and lingula.  There is an open anterior abdominal incision, with some packing/bandage material seen within it.  No enhancing fluid collection is seen to suggest an abscess at the site of this open wound.  There is surgical suture associated with several loops of distal  ileum.   There may be some bowel wall thickening of the pelvic bowel loops which contain surgical suture  (image number 65). These distal small bowel loops may be tethered anteriorly There appears to be a an anastomosis of small bowel with sigmoid colon on image number 71. There is no evidence of  bowel obstruction.  It appears that the majority of the colon has been resected, with residual sigmoid colon and rectum present. Urinary bladder not very distended.  Hysterectomy.  Previously described ovarian masses on CT of December 2010 have been removed, there are surgical clips in the left adnexa.  No intraabdominal or intrapelvic abscess is seen.  The noncontrast appearance of the liver, spleen, adrenal glands, pancreas, and kidneys are within normal limits.  Specifically, there are no renal calculi and there is no hydronephrosis.  Both ureters are normal in caliber.  No ureteral stone is seen.  Abdominal aorta is normal in caliber.  IVC filter is present at the level of L1-L2.  There is no lymphadenopathy.  No acute bony abnormality peri  IMPRESSION: . 1.  Negative for urinary tract stones or obstruction. 2.  Large open midline abdominal surgical wound with packing/bandage material.  No evidence of abdominal wall abscess. 3.  Postsurgical changes of the bowel with suspected enterocolonic anastomosis at the level of the sigmoid colon with apparent surgical resection of the the remainder of the colon.  Some of the distal small bowel loops demonstrate some mild wall thickening and may be partially tethered to the anterior abdominal wall.  Findings may reflect recent postoperative changes, inflammation, or adhesion formation.  There is no evidence of bowel obstruction.  4.  Bilateral lower lobe atelectasis and/or consolidation.  Original Report Authenticated By: Britta Mccreedy, M.D.   Nm Pulmonary Perfusion  08/29/2011  *RADIOLOGY REPORT*  Clinical Data:  50 year old female with chest pain and shortness of breath.  NUCLEAR MEDICINE VENTILATION - PERFUSION LUNG SCAN  Technique:  Wash-in, equilibrium, and wash-out phase  ventilation images were obtained using Xe-133 gas.  Perfusion images were obtained in multiple projections after intravenous injection of Tc- 58m MAA.  Radiopharmaceuticals:  13.5 mCi Xe-133 gas and 5.0 mCi Tc-25m MAA.  Comparison:  08/29/2011 chest radiograph.  Findings: There is a perfusion defect involving the entire lingula. There is decreased ventilation in this area. Opacity within the lingula on recent chest radiograph is also noted. No other perfusion or ventilation abnormalities are present.  IMPRESSION: Triple matched defect of the lingula - intermediate probability for pulmonary embolus  (20-79%).  Original Report Authenticated By: Rosendo Gros, M.D.   Dg Chest Portable 1 View  08/29/2011  *RADIOLOGY REPORT*  Clinical Data: Shortness of breath and chest pain.  Recent surgery.  PORTABLE CHEST - 1 VIEW  Comparison: 08/11/2009  Findings: The cardiopericardial silhouette is within normal limits. Increased lingular opacity is identified - question atelectasis/collapse or consolidation. Mild airspace disease/atelectasis in the right lower lung is noted. There is no evidence of pleural effusion or pneumothorax.  IMPRESSION: Lingular atelectasis versus consolidation.  Mild airspace disease/atelectasis in the right lower lung.  Original Report Authenticated By: Rosendo Gros, M.D.    17:35 Dr Si Gaul called to discuss her VQ scan results, feels intermediate probability from possible atelectasis, but cannot rule out PE. States he saw her greenfield filter on CT scan. Asks to see what her contrast allergy is and possible premedicate for CT angio chest.  Pt states they had discussed putting her back on coumadin while she was on [facility] but elected to wait b/o her open abd wound.   PT states she was on [place] heparin TID in the hospital, she was given lovenox in the ED.   7:12 PM Dr Barnie Del is going to evaluate patient.   Dr Rito Ehrlich has seen patient and has admitted her.  Ward Givens, MD 08/29/11 2222

## 2011-08-29 NOTE — ED Notes (Signed)
Pt brought in by EMS for low back pain and "left lower lobe lung pain". Pt presents with large abdominal wound and drain that is draining what appears to be fecal matter. Pt states pain increases with deep breath.

## 2011-08-29 NOTE — H&P (Signed)
[name] is an 50 y.o. female.  Her primary care physician is Dr. Syliva Overman. She had a prolonged stay at [facility] recently.  Chief Complaint:  flank pain, and shortness of breath   HPI: This is a 50 year old, Caucasian female, who has history of PE in the past. She has a history of a breast cancer. She was recently diagnosed with a tumor in the left ovary and underwent surgery at [facility] on July 27 to remove this mass. The surgery was complicated by perforation of the bowels. This was followed by a very complicated course which involved multiple laparotomies. And, finally they had to remove a lot of her bowels. Also, she has wound dehiscence and has a wound bag. The stay was also complicated by sepsis. She had Escherichia coli infection. She spent many days in the intensive care unit. She was initially given vancomycin and Avelox, but as a result of it she developed renal failure. She states that dialysis was being considered, but she did not require dialysis. She was actually put on Cipro and she finished that course. She returned home on Monday.  She tells me that since she came back home she been feeling very exhausted. However, she's been trying to ambulate herself. And then last night she started having a stabbing pain to her lower back. She had some difficulty in her chest. Has shortness of breath, but denies any cough, per se. The pain would increase with deep breathing. This morning the pain was severe. She was crying out in pain and so she decided to come in to the hospital. Denies any fever or chills. Recently, however, has been running a low-grade temperature of 13F. She's been making good output the from the wound. She's been making good amounts of stools. She's been having good urine output. She did have transient dysuria yesterday.  She had the PE in 1997 after she had ablation for SVT. She was on Coumadin up until 2006. And then she was diagnosed with breast  cancer and had surgery. This was complicated by hemorrhage. So, the Coumadin was discontinued. She subsequently had an inferior vena cava filter placed at that time.    Prior to Admission medications   Medication Sig Start Date End Date Taking? Authorizing Aiva Miskell  ALPRAZolam Prudy Feeler) 1 MG tablet Take 1 mg by mouth 4 (four) times daily.     Yes Historical Satine Hausner, MD  butalbital-acetaminophen-caffeine (FIORICET, ESGIC) 50-325-40 MG per tablet Take 1 tablet by mouth 2 (two) times daily as needed. For migraines    Yes Historical Jowanda Heeg, MD  cyclobenzaprine (FLEXERIL) 10 MG tablet Take 10 mg by mouth 3 (three) times daily as needed. For fibromyalgia   06/12/11 06/11/12 Yes Syliva Overman, MD  HYDROcodone-acetaminophen [facility]) 10-325 MG per tablet  08/07/11  Yes Historical Giuliano Preece, MD  levothyroxine (SYNTHROID) 100 MCG tablet Take 1 tablet (100 mcg total) by mouth daily. 06/12/11 06/11/12 Yes Syliva Overman, MD  omeprazole (PRILOSEC) 40 MG capsule Take 1 capsule (40 mg total) by mouth daily. 07/17/11 07/16/12 Yes Syliva Overman, MD  ondansetron (ZOFRAN) 4 MG tablet Take 4 mg by mouth every 8 (eight) hours as needed. For nausea     Yes Historical Jotham Ahn, MD  pravastatin (PRAVACHOL) 80 MG tablet Take 1 tablet (80 mg total) by mouth every evening. Dose increase 06/19/11 06/18/12 Yes Syliva Overman, MD  ibuprofen (ADVIL,MOTRIN) 800 MG tablet  08/07/11   Historical Kyro Joswick, MD  spironolactone (ALDACTONE) 25 MG tablet Take 1 tablet (25  mg total) by mouth 2 (two) times daily. 06/12/11 06/11/12  Syliva Overman, MD    Allergies:  Allergies  Allergen Reactions  . Biaxin Other (See Comments)    Cluster migraines   . Clarithromycin Hives  . Cleocin Hives  . Codeine Itching  . Keflex (Cephalexin) Hives  . Nitrofurantoin Hives  . Penicillins Hives  . Scopolamine Hbr Other (See Comments)    Cluster migraines  . Sulfonamide Derivatives Hives  . Tape Other (See Comments)    Blistering, use paper  tape     Past Medical History  Diagnosis Date  . SVT (supraventricular tachycardia)   . Hypercholesterolemia   . Bilateral ovarian cysts   . Perforation bowel   . PE (pulmonary embolism)   . Thyroid disease   . GERD (gastroesophageal reflux disease)   . Cancer   . Sepsis   . E coli infection   . Acute renal failure   . Anxiety   . Pneumonia     Please see above for her recent complicated course at [facility]. She also has hypercholesterolemia. She has history of fibromyalgia. She denies any history of coronary artery disease or diabetes. She has had TIAs in the past. She has a history of migraine headaches.  Past Surgical History  Procedure Date  . Ablasion   . Vena cava filter placement   . Abdominal hysterectomy   . Hernia repair   . Colon surgery   . Bowel resection     she's had a cholecystectomy done. She's had surgery for breast cancer in the form of left mastectomy in 2006. She's had multiple surgeries for small bowel obstructions. And then she had colon resection many years ago, done by Dr. Katrinka Blazing.  Social History:  reports that she has never smoked. She does not have any smokeless tobacco history on file. She reports that she does not drink alcohol or use illicit drugs. she lives with her husband and family.   Family History: History reviewed. No pertinent family history.  Review of Systems  Constitutional: Positive for weight loss. Negative for fever and chills.  HENT: Negative.   Eyes: Negative.   Respiratory: Positive for shortness of breath.   Cardiovascular: Positive for chest pain.  Gastrointestinal: Negative for diarrhea.  Genitourinary: Positive for dysuria.  Musculoskeletal: Negative.   Skin: Negative.   Neurological: Positive for weakness.  Endo/Heme/Allergies: Negative.   Psychiatric/Behavioral: Negative.     Blood pressure 119/58, pulse 97, temperature 99.3 F (37.4 C), resp. rate 20, height 5\' 4"  (1.626 m), weight 90.266 kg (199  lb), SpO2 95.00%. Physical Exam  Vitals reviewed. Constitutional: She is oriented to person, place, and time. She appears well-developed and well-nourished. No distress.  HENT:  Head: Normocephalic and atraumatic.  Mouth/Throat: Oropharynx is clear and moist. No oropharyngeal exudate.  Eyes: EOM are normal. Pupils are equal, round, and reactive to light. Right eye exhibits no discharge. Left eye exhibits no discharge. No scleral icterus.  Neck: Normal range of motion. Neck supple. No tracheal deviation present. No thyromegaly present.  Cardiovascular: Normal rate and regular rhythm.  Exam reveals no gallop and no friction rub.   No murmur heard. Pulmonary/Chest: Effort normal. No respiratory distress. She has decreased breath sounds in the right lower field and the left lower field. She has no wheezes. She has no rales.  Abdominal: Bowel sounds are normal. There is generalized tenderness. There is no rigidity, no rebound, no guarding and negative Murphy's sign.  Open abdominal wound with wound bag  Musculoskeletal: Normal range of motion.  Lymphadenopathy:    She has no cervical adenopathy.  Neurological: She is alert and oriented to person, place, and time. No cranial nerve deficit.  Skin: Skin is warm and dry. She is not diaphoretic.  Psychiatric: She has a normal mood and affect.     Results for orders placed during the hospital encounter of 08/29/11 (from the past 48 hour(s))  CBC     Status: Abnormal   Collection Time   08/29/11  2:31 PM      Component Value Range Comment   WBC 10.3  4.0 - 10.5 (K/uL)    RBC 3.81 (*) 3.87 - 5.11 (MIL/uL)    Hemoglobin 10.7 (*) 12.0 - 15.0 (g/dL)    HCT 16.1 (*) 09.6 - 46.0 (%)    MCV 87.7  78.0 - 100.0 (fL)    MCH 28.1  26.0 - 34.0 (pg)    MCHC 32.0  30.0 - 36.0 (g/dL)    RDW 04.5  40.9 - 81.1 (%)    Platelets 370  150 - 400 (K/uL)   COMPREHENSIVE METABOLIC PANEL     Status: Abnormal   Collection Time   08/29/11  2:31 PM       Component Value Range Comment   Sodium 135  135 - 145 (mEq/L)    Potassium 3.9  3.5 - 5.1 (mEq/L)    Chloride 101  96 - 112 (mEq/L)    CO2 23  19 - 32 (mEq/L)    Glucose, Bld 102 (*) 70 - 99 (mg/dL)    BUN 14  6 - 23 (mg/dL)    Creatinine, Ser 9.14 (*) 0.50 - 1.10 (mg/dL)    Calcium 9.1  8.4 - 10.5 (mg/dL)    Total Protein 6.6  6.0 - 8.3 (g/dL)    Albumin 3.1 (*) 3.5 - 5.2 (g/dL)    AST 12  0 - 37 (U/L)    ALT 11  0 - 35 (U/L)    Alkaline Phosphatase 109  39 - 117 (U/L)    Total Bilirubin 0.4  0.3 - 1.2 (mg/dL)    GFR calc non Af Amer 44 (*) >60 (mL/min)    GFR calc Af Amer 53 (*) >60 (mL/min)   URINALYSIS, ROUTINE W REFLEX MICROSCOPIC     Status: Abnormal   Collection Time   08/29/11  3:02 PM      Component Value Range Comment   Color, Urine YELLOW  YELLOW     Appearance CLEAR  CLEAR     Specific Gravity, Urine 1.020  1.005 - 1.030     pH 6.0  5.0 - 8.0     Glucose, UA NEGATIVE  NEGATIVE (mg/dL)    Hgb urine dipstick TRACE (*) NEGATIVE     Bilirubin Urine SMALL (*) NEGATIVE     Ketones, ur NEGATIVE  NEGATIVE (mg/dL)    Protein, ur NEGATIVE  NEGATIVE (mg/dL)    Urobilinogen, UA 0.2  0.0 - 1.0 (mg/dL)    Nitrite NEGATIVE  NEGATIVE     Leukocytes, UA NEGATIVE  NEGATIVE    URINE MICROSCOPIC-ADD ON     Status: Abnormal   Collection Time   08/29/11  3:02 PM      Component Value Range Comment   Squamous Epithelial / LPF FEW (*) RARE     WBC, UA 21-50  <3 (WBC/hpf)    RBC / HPF 7-10  <3 (RBC/hpf)    Bacteria, UA MANY (*)  RARE     Ct Abdomen Pelvis Wo Contrast  08/29/2011  *RADIOLOGY REPORT*  Clinical Data: Left flank pain.  Question stone.  CT ABDOMEN AND PELVIS WITHOUT CONTRAST  Technique:  Multidetector CT imaging of the abdomen and pelvis was performed following the standard protocol without intravenous contrast.  Comparison: Abdomen pelvis CT 12/08/2009  Findings: The imaged portion of the lower chest demonstrates left mastectomy with TRAM reconstruction and a right breast implant,  partially visualized.  There is atelectasis and/or consolidation in both lower lobes, left greater than right, and there are linear bands of atelectasis or scarring in the right middle lobe and lingula.  There is an open anterior abdominal incision, with some packing/bandage material seen within it.  No enhancing fluid collection is seen to suggest an abscess at the site of this open wound.  There is surgical suture associated with several loops of distal  ileum.   There may be some bowel wall thickening of the pelvic bowel loops which contain surgical suture (image number [id number]). These distal small bowel loops may be tethered anteriorly There appears to be a an anastomosis of small bowel with sigmoid colon on image number [id number]. There is no evidence of bowel obstruction.  It appears that the majority of the colon has been resected, with residual sigmoid colon and rectum present. Urinary bladder not very distended.  Hysterectomy.  Previously described ovarian masses on CT of December 2010 have been removed, there are surgical clips in the left adnexa.  No intraabdominal or intrapelvic abscess is seen.  The noncontrast appearance of the liver, spleen, adrenal glands, pancreas, and kidneys are within normal limits.  Specifically, there are no renal calculi and there is no hydronephrosis.  Both ureters are normal in caliber.  No ureteral stone is seen.  Abdominal aorta is normal in caliber.  IVC filter is present at the level of L1-L2.  There is no lymphadenopathy.  No acute bony abnormality peri  IMPRESSION: . 1.  Negative for urinary tract stones or obstruction. 2.  Large open midline abdominal surgical wound with packing/bandage material.  No evidence of abdominal wall abscess. 3.  Postsurgical changes of the bowel with suspected enterocolonic anastomosis at the level of the sigmoid colon with apparent surgical resection of the the remainder of the colon.  Some of the distal small bowel loops demonstrate some mild wall  thickening and may be partially tethered to the anterior abdominal wall.  Findings may reflect recent postoperative changes, inflammation, or adhesion formation.  There is no evidence of bowel obstruction.  4.  Bilateral lower lobe atelectasis and/or consolidation.  Original Report Authenticated By: Britta Mccreedy, M.D.   Nm Pulmonary Perfusion  08/29/2011  *RADIOLOGY REPORT*  Clinical Data:  50 year old female with chest pain and shortness of breath.  NUCLEAR MEDICINE VENTILATION - PERFUSION LUNG SCAN  Technique:  Wash-in, equilibrium, and wash-out phase ventilation images were obtained using Xe-133 gas.  Perfusion images were obtained in multiple projections after intravenous injection of Tc- 79m MAA.  Radiopharmaceuticals:  13.5 mCi Xe-133 gas and 5.0 mCi Tc-52m MAA.  Comparison:  08/29/2011 chest radiograph.  Findings: There is a perfusion defect involving the entire lingula. There is decreased ventilation in this area. Opacity within the lingula on recent chest radiograph is also noted. No other perfusion or ventilation abnormalities are present.  IMPRESSION: Triple matched defect of the lingula - intermediate probability for pulmonary embolus  (20-79%).  Original Report Authenticated By: Rosendo Gros, M.D.   Dg Chest Portable  1 View  08/29/2011  *RADIOLOGY REPORT*  Clinical Data: Shortness of breath and chest pain.  Recent surgery.  PORTABLE CHEST - 1 VIEW  Comparison: 08/11/2009  Findings: The cardiopericardial silhouette is within normal limits. Increased lingular opacity is identified - question atelectasis/collapse or consolidation. Mild airspace disease/atelectasis in the right lower lung is noted. There is no evidence of pleural effusion or pneumothorax.  IMPRESSION: Lingular atelectasis versus consolidation.  Mild airspace disease/atelectasis in the right lower lung.  Original Report Authenticated By: Rosendo Gros, M.D.     Assessment/Plan  Principal Problem:  *Pulmonary embolism Active  Problems:  Unspecified hypothyroidism  ANEMIA, IRON DEFICIENCY, CHRONIC  ANXIETY, CHRONIC  Abdominal wound dehiscence    #1 intermediate probability of pulmonary embolism: Considering her recent hospital stay and her presenting symptoms, along with a VQ scan findings. I think she needs to be treated for PE. We will proceed with getting venous Dopplers of her lower extremity. I will also get an input from the pulmonologist to see, if we are on the right track. Patient is extremely concerned about the possibility of bleeding from her abdominal wound. I've explained the risks and benefits of such an approach. She already has a filter in her inferior vena cava. We will keep her just on the Lovenox for now and hold off on Coumadin until she is seen by Dr. Juanetta Gosling. In the, meantime, we'll watch for bleeding very closely. If she indeed develops a significant bleeding we may have to reconsider the above. Once again, patient was explained all of the above in great detail and she understands the risks.  #2 anemia: check anemia panel.  #3 Recent abdominal surgery with the open wound. Will get a wound care nurse to evaluate her. Patient tells me that she plans to go to [facility] for further management of her abdominal wound. A CT scan was done, which did not does not show any abscess. There is no evidence for any bowel obstruction. No need for surgical consultation at this time.  #4 history of, hypothyroidism. We'll check a free T4 level as her TSH was elevated.  #5 anxiety continue with Xanax as needed,  We'll hold off on any more antibiotics at this time. Urine cultures will be sent. UA was abnormal however, her nitrite, and leukocytes were negative.  This is a complicated case. We'll continue to monitor closely.  Further management decisions will depend on results of further testing and patient's response to treatment.   KRISHNAN,GOKUL 08/29/2011, 8:37 PM

## 2011-08-29 NOTE — ED Notes (Signed)
Attempted to call report.  Nurse unavailable. 

## 2011-08-29 NOTE — ED Notes (Signed)
Pt to VQ scan via stretcher.

## 2011-08-29 NOTE — ED Notes (Signed)
Attempted to call report x2

## 2011-08-30 ENCOUNTER — Inpatient Hospital Stay (HOSPITAL_COMMUNITY): Payer: Medicare Other

## 2011-08-30 LAB — CLOSTRIDIUM DIFFICILE BY PCR: Toxigenic C. Difficile by PCR: NEGATIVE

## 2011-08-30 LAB — VITAMIN B12: Vitamin B-12: 683 pg/mL (ref 211–911)

## 2011-08-30 LAB — COMPREHENSIVE METABOLIC PANEL
AST: 11 U/L (ref 0–37)
Albumin: 2.7 g/dL — ABNORMAL LOW (ref 3.5–5.2)
BUN: 13 mg/dL (ref 6–23)
Calcium: 8.6 mg/dL (ref 8.4–10.5)
Chloride: 101 mEq/L (ref 96–112)
Creatinine, Ser: 1.28 mg/dL — ABNORMAL HIGH (ref 0.50–1.10)
GFR calc non Af Amer: 44 mL/min — ABNORMAL LOW (ref >60)
Total Bilirubin: 0.3 mg/dL (ref 0.3–1.2)

## 2011-08-30 LAB — CBC
Hemoglobin: 9.8 g/dL — ABNORMAL LOW (ref 12.0–15.0)
MCH: 28.7 pg (ref 26.0–34.0)
MCV: 88.9 fL (ref 78.0–100.0)
Platelets: 343 10*3/uL (ref 150–400)
RBC: 3.42 MIL/uL — ABNORMAL LOW (ref 3.87–5.11)
WBC: 6 10*3/uL (ref 4.0–10.5)

## 2011-08-30 LAB — MRSA PCR SCREENING: MRSA by PCR: NEGATIVE

## 2011-08-30 LAB — T4, FREE: Free T4: 1.43 ng/dL (ref 0.80–1.80)

## 2011-08-30 LAB — FERRITIN: Ferritin: 103 ng/mL (ref 10–291)

## 2011-08-30 MED ORDER — HYDROMORPHONE HCL 1 MG/ML IJ SOLN
1.0000 mg | INTRAMUSCULAR | Status: DC | PRN
  Administered 2011-08-30 – 2011-09-01 (×11): 2 mg via INTRAVENOUS
  Administered 2011-09-01: 1 mg via INTRAVENOUS
  Administered 2011-09-01: 2 mg via INTRAVENOUS
  Administered 2011-09-01: 1 mg via INTRAVENOUS
  Administered 2011-09-01 (×3): 2 mg via INTRAVENOUS
  Administered 2011-09-02 (×2): 1 mg via INTRAVENOUS
  Administered 2011-09-02: 2 mg via INTRAVENOUS
  Administered 2011-09-02: 1 mg via INTRAVENOUS
  Administered 2011-09-02 – 2011-09-03 (×5): 2 mg via INTRAVENOUS
  Filled 2011-08-30: qty 1
  Filled 2011-08-30 (×2): qty 2
  Filled 2011-08-30: qty 1
  Filled 2011-08-30 (×2): qty 2
  Filled 2011-08-30 (×2): qty 1
  Filled 2011-08-30: qty 2
  Filled 2011-08-30 (×2): qty 1
  Filled 2011-08-30 (×8): qty 2
  Filled 2011-08-30: qty 1
  Filled 2011-08-30 (×2): qty 2
  Filled 2011-08-30: qty 1
  Filled 2011-08-30 (×3): qty 2
  Filled 2011-08-30: qty 1
  Filled 2011-08-30: qty 2

## 2011-08-30 MED ORDER — POTASSIUM CHLORIDE CRYS ER 20 MEQ PO TBCR
40.0000 meq | EXTENDED_RELEASE_TABLET | Freq: Once | ORAL | Status: AC
  Administered 2011-08-30: 40 meq via ORAL
  Filled 2011-08-30: qty 2

## 2011-08-30 MED ORDER — SODIUM CHLORIDE 0.9 % IJ SOLN
INTRAMUSCULAR | Status: AC
  Filled 2011-08-30: qty 20

## 2011-08-30 MED ORDER — ENOXAPARIN SODIUM 100 MG/ML ~~LOC~~ SOLN
1.0000 mg/kg | Freq: Two times a day (BID) | SUBCUTANEOUS | Status: DC
  Administered 2011-08-30 – 2011-09-02 (×8): 100 mg via SUBCUTANEOUS
  Filled 2011-08-30 (×11): qty 1

## 2011-08-30 MED ORDER — MORPHINE SULFATE 2 MG/ML IJ SOLN
2.0000 mg | INTRAMUSCULAR | Status: DC | PRN
  Administered 2011-08-30 (×2): 2 mg via INTRAVENOUS
  Filled 2011-08-30 (×2): qty 1

## 2011-08-30 NOTE — Progress Notes (Signed)
Subjective: Complains of pain in the left upper back area.  Objective: Vital signs in last 24 hours: Temp:  [98.1 F (36.7 C)-99.3 F (37.4 C)] 98.1 F (36.7 C) (09/02 0520) Pulse Rate:  [69-110] 94  (09/02 0520) Resp:  [16-20] 20  (09/02 0520) BP: (108-121)/(52-73) 110/71 mmHg (09/02 0520) SpO2:  [93 %-100 %] 93 % (09/02 0520) Weight:  [90.266 kg (199 lb)-98.9 kg (218 lb 0.6 oz)] 218 lb 0.6 oz (98.9 kg) (09/01 2245) Weight change:     Intake/Output from previous day: 09/01 0701 - 09/02 0700 In: 705 [I.V.:705] Out: 475 [Urine:200; Stool:275]     Physical Exam: General: Alert, awake, oriented x3, moderate distress secondary to above mentioned pain.  \HEENT: No bruits, no goiter. Heart: Regular rate and rhythm, without murmurs, rubs, gallops. Lungs: Clear to auscultation bilaterally. Abdomen: Large abdominal wound, healing by second intention.  Extremities: No clubbing cyanosis or edema with positive pedal pulses. Neuro: Grossly intact, nonfocal.    Lab Results:  Basename 08/30/11 0433 08/29/11 1431  WBC 6.0 10.3  HGB 9.8* 10.7*  HCT 30.4* 33.4*  PLT 343 370    Basename 08/30/11 0433 08/29/11 1431  NA 135 135  K 3.2* 3.9  CL 101 101  CO2 24 23  GLUCOSE 103* 102*  BUN 13 14  CREATININE 1.28* 1.30*  CALCIUM 8.6 9.1   Recent Results (from the past 240 hour(s))  MRSA PCR SCREENING     Status: Normal   Collection Time   08/29/11 11:08 PM      Component Value Range Status Comment   MRSA by PCR NEGATIVE  NEGATIVE  Final   CLOSTRIDIUM DIFFICILE BY PCR     Status: Normal   Collection Time   08/29/11 11:09 PM      Component Value Range Status Comment   C difficile by pcr NEGATIVE  NEGATIVE  Final      Studies/Results: Ct Abdomen Pelvis Wo Contrast  08/29/2011  *RADIOLOGY REPORT*  Clinical Data: Left flank pain.  Question stone.  CT ABDOMEN AND PELVIS WITHOUT CONTRAST  Technique:  Multidetector CT imaging of the abdomen and pelvis was performed following the  standard protocol without intravenous contrast.  Comparison: Abdomen pelvis CT 12/08/2009  Findings: The imaged portion of the lower chest demonstrates left mastectomy with TRAM reconstruction and a right breast implant, partially visualized.  There is atelectasis and/or consolidation in both lower lobes, left greater than right, and there are linear bands of atelectasis or scarring in the right middle lobe and lingula.  There is an open anterior abdominal incision, with some packing/bandage material seen within it.  No enhancing fluid collection is seen to suggest an abscess at the site of this open wound.  There is surgical suture associated with several loops of distal  ileum.   There may be some bowel wall thickening of the pelvic bowel loops which contain surgical suture (image number 65). These distal small bowel loops may be tethered anteriorly There appears to be a an anastomosis of small bowel with sigmoid colon on image number 71. There is no evidence of bowel obstruction.  It appears that the majority of the colon has been resected, with residual sigmoid colon and rectum present. Urinary bladder not very distended.  Hysterectomy.  Previously described ovarian masses on CT of December 2010 have been removed, there are surgical clips in the left adnexa.  No intraabdominal or intrapelvic abscess is seen.  The noncontrast appearance of the liver, spleen, adrenal glands, pancreas, and  kidneys are within normal limits.  Specifically, there are no renal calculi and there is no hydronephrosis.  Both ureters are normal in caliber.  No ureteral stone is seen.  Abdominal aorta is normal in caliber.  IVC filter is present at the level of L1-L2.  There is no lymphadenopathy.  No acute bony abnormality peri  IMPRESSION: . 1.  Negative for urinary tract stones or obstruction. 2.  Large open midline abdominal surgical wound with packing/bandage material.  No evidence of abdominal wall abscess. 3.  Postsurgical changes of  the bowel with suspected enterocolonic anastomosis at the level of the sigmoid colon with apparent surgical resection of the the remainder of the colon.  Some of the distal small bowel loops demonstrate some mild wall thickening and may be partially tethered to the anterior abdominal wall.  Findings may reflect recent postoperative changes, inflammation, or adhesion formation.  There is no evidence of bowel obstruction.  4.  Bilateral lower lobe atelectasis and/or consolidation.  Original Report Authenticated By: Britta Mccreedy, M.D.   Nm Pulmonary Perfusion  08/29/2011  *RADIOLOGY REPORT*  Clinical Data:  50 year old female with chest pain and shortness of breath.  NUCLEAR MEDICINE VENTILATION - PERFUSION LUNG SCAN  Technique:  Wash-in, equilibrium, and wash-out phase ventilation images were obtained using Xe-133 gas.  Perfusion images were obtained in multiple projections after intravenous injection of Tc- 33m MAA.  Radiopharmaceuticals:  13.5 mCi Xe-133 gas and 5.0 mCi Tc-68m MAA.  Comparison:  08/29/2011 chest radiograph.  Findings: There is a perfusion defect involving the entire lingula. There is decreased ventilation in this area. Opacity within the lingula on recent chest radiograph is also noted. No other perfusion or ventilation abnormalities are present.  IMPRESSION: Triple matched defect of the lingula - intermediate probability for pulmonary embolus  (20-79%).  Original Report Authenticated By: Rosendo Gros, M.D.   Dg Chest Portable 1 View  08/29/2011  *RADIOLOGY REPORT*  Clinical Data: Shortness of breath and chest pain.  Recent surgery.  PORTABLE CHEST - 1 VIEW  Comparison: 08/11/2009  Findings: The cardiopericardial silhouette is within normal limits. Increased lingular opacity is identified - question atelectasis/collapse or consolidation. Mild airspace disease/atelectasis in the right lower lung is noted. There is no evidence of pleural effusion or pneumothorax.  IMPRESSION: Lingular atelectasis  versus consolidation.  Mild airspace disease/atelectasis in the right lower lung.  Original Report Authenticated By: Rosendo Gros, M.D.    Medications: Scheduled Meds:   . sodium chloride   Intravenous Once  . enoxaparin (LOVENOX) injection  1 mg/kg Subcutaneous Q12H  . enoxaparin  80 mg Subcutaneous Once  . HYDROmorphone  1 mg Intravenous Once  . HYDROmorphone  1 mg Intravenous Once  . levothyroxine  100 mcg Oral Daily  . pantoprazole  40 mg Oral Q1200  . simvastatin  40 mg Oral q1800  . sodium chloride  1,000 mL Intravenous Once  . DISCONTD: ciprofloxacin  400 mg Intravenous Once  . DISCONTD: enoxaparin  1 mg/kg Subcutaneous Q12H   Continuous Infusions:   . sodium chloride 100 mL/hr at 08/29/11 2250   PRN Meds:.acetaminophen, acetaminophen, albuterol, ALPRAZolam, cyclobenzaprine, HYDROcodone-acetaminophen, HYDROmorphone (DILAUDID) injection, ondansetron (ZOFRAN) IV, ondansetron, technetium albumin aggregated, xenon xe 133, DISCONTD: morphine, DISCONTD: morphine, DISCONTD: morphine, DISCONTD: ondansetron  Assessment/Plan:  Principal Problem:  *Pulmonary embolism Active Problems:  Unspecified hypothyroidism  ANEMIA, IRON DEFICIENCY, CHRONIC  ANXIETY, CHRONIC  Abdominal wound dehiscence  #1 VQ scan with intermediate probability for PE: Lower extremity Dopplers are pending. She has had  PE in 2006 and completed treatment with Coumadin for 6 months. She recently has had a prolonged hospitalization stay at [facility] secondary to bowel perforation and multiple surgeries with Escherichia coli sepsis for what was initially an ovarian mass, this prolonged hospitalization certainly puts her at a higher risk for developing a PE. She has been started on therapeutic doses of Lovenox. Interestingly, she has an IVC filter in place as well. She is very hesitant about anticoagulation and long-term Coumadin given her open abdominal wound. I have had a very long conversation with patient and her  husband regarding transfer to [facility]. I believe it is in her best interest to go to [facility], as this hospital is  unequipped to deal with her severe abdominal wound and the surgeons there could give further insight on how safe it is to anticoagulate her. Patient is very undecided about transferring to [place], [place]. They will have further family discussions and let me know in the morning. Patient really wants Dr. Dietrich Pates to weigh in on anticoagulation, will try to reach him in the morning even though it is a holiday.   LOS: 1 day   HERNANDEZ ACOSTA,ESTELA 08/30/2011, 10:58 AM

## 2011-08-30 NOTE — Progress Notes (Signed)
Patient's daughter changed abdominal wound dressing, appliance and bag, per patient's request.

## 2011-08-30 NOTE — Progress Notes (Signed)
ANTICOAGULATION CONSULT NOTE - Initial Consult  Pharmacy Consult for Lovenox Indication: pulmonary embolus  Patient Measurements: Height: 5\' 4"  (162.6 cm) Weight: 218 lb 0.6 oz (98.9 kg) IBW/kg (Calculated) : 54.7   Vital Signs: Temp: 98.1 F (36.7 C) (09/02 0520) Temp src: Oral (09/02 0520) BP: 110/71 mmHg (09/02 0520) Pulse Rate: 94  (09/02 0520)  Labs:  Basename 08/30/11 0433 08/29/11 1431  HGB 9.8* 10.7*  HCT 30.4* 33.4*  PLT 343 370  APTT -- --  LABPROT -- --  INR -- --  HEPARINUNFRC -- --  CREATININE 1.28* 1.30*  CRCLEARANCE -- --  CKTOTAL -- --  CKMB -- --  TROPONINI -- --    Medical History: Past Medical History  Diagnosis Date  . SVT (supraventricular tachycardia)   . Hypercholesterolemia   . Bilateral ovarian cysts   . Perforation bowel   . PE (pulmonary embolism)   . Thyroid disease   . GERD (gastroesophageal reflux disease)   . Cancer   . Sepsis   . E coli infection   . Acute renal failure   . Anxiety   . Pneumonia     Medications:  Prescriptions prior to admission  Medication Sig Dispense Refill  . ALPRAZolam (XANAX) 1 MG tablet Take 1 mg by mouth 4 (four) times daily.        . butalbital-acetaminophen-caffeine (FIORICET, ESGIC) 50-325-40 MG per tablet Take 1 tablet by mouth 2 (two) times daily as needed. For migraines       . cyclobenzaprine (FLEXERIL) 10 MG tablet Take 10 mg by mouth 3 (three) times daily as needed. For fibromyalgia        . HYDROcodone-acetaminophen (NORCO) 10-325 MG per tablet       . levothyroxine (SYNTHROID) 100 MCG tablet Take 1 tablet (100 mcg total) by mouth daily.  90 tablet  0  . omeprazole (PRILOSEC) 40 MG capsule Take 1 capsule (40 mg total) by mouth daily.  90 capsule  1  . ondansetron (ZOFRAN) 4 MG tablet Take 4 mg by mouth every 8 (eight) hours as needed. For nausea        . pravastatin (PRAVACHOL) 80 MG tablet Take 1 tablet (80 mg total) by mouth every evening. Dose increase  90 tablet  0  . ibuprofen  (ADVIL,MOTRIN) 800 MG tablet       . spironolactone (ALDACTONE) 25 MG tablet Take 1 tablet (25 mg total) by mouth 2 (two) times daily.  180 tablet  0    Assessment: Okay for Protocol Open abdominal wound No warfarin yet per MD note  Goal of Therapy:  Treament Dose   Plan:  Lovenox treatment dose.  Check CBC every 3 days. Check INR in AM  Lamonte Richer R 08/30/2011,10:51 AM

## 2011-08-31 LAB — CBC
HCT: 30 % — ABNORMAL LOW (ref 36.0–46.0)
MCH: 28.6 pg (ref 26.0–34.0)
MCHC: 32.3 g/dL (ref 30.0–36.0)
RDW: 14.8 % (ref 11.5–15.5)

## 2011-08-31 MED ORDER — MULTI-DELYN PO LIQD
5.0000 mL | Freq: Every day | ORAL | Status: DC
  Filled 2011-08-31 (×5): qty 5

## 2011-08-31 NOTE — Progress Notes (Signed)
Returned this afternoon to evaluate patient given she was sleeping earlier today. Feels that her left lower back pain is improving significantly. I discussed my plan with her to call her primary care provider, Dr. Lodema Hong, her cardiologist Dr. Dietrich Pates and Dr. Graciela Husbands and reviewing her records from [facility] prior to making a decision regarding long-term anticoagulation. With her second episode of PE and a history of ventricular clots (per patient report) I believe she requires long-term anticoagulation with Coumadin. We just have to make a decision whether the benefit outweighs the risk given her ongoing abdominal issues and open abdominal wound.  Physical exam: General: Alert, awake, oriented x3. HEENT: No bruits, no goiter.  Heart: Regular rate and rhythm, without murmurs, rubs, gallops.  Lungs: Clear to auscultation bilaterally.  Abdomen: Large abdominal wound, healing by second intention.  Extremities: No clubbing cyanosis or edema with positive pedal pulses.  Neuro: Grossly intact, nonfocal.

## 2011-08-31 NOTE — Progress Notes (Signed)
Subjective: Currently sleeping, will not disturb.  Objective: Vital signs in last 24 hours: Temp:  [98.2 F (36.8 C)-98.9 F (37.2 C)] 98.9 F (37.2 C) (09/03 0558) Pulse Rate:  [88-93] 92  (09/03 0558) Resp:  [18-20] 20  (09/03 0558) BP: (106-143)/(67-76) 106/71 mmHg (09/03 0558) SpO2:  [95 %-97 %] 97 % (09/03 0558) Weight change:  Last BM Date: 08/30/11  Intake/Output from previous day: 09/02 0701 - 09/03 0700 In: 840 [P.O.:840] Out: 550 [Urine:550]     Physical Exam: Exam deferred as patient is sleeping.   Lab Results:  Basename 08/31/11 0542 08/30/11 0433  WBC 5.5 6.0  HGB 9.7* 9.8*  HCT 30.0* 30.4*  PLT 297 343    Basename 08/30/11 0433 08/29/11 1431  NA 135 135  K 3.2* 3.9  CL 101 101  CO2 24 23  GLUCOSE 103* 102*  BUN 13 14  CREATININE 1.28* 1.30*  CALCIUM 8.6 9.1   Recent Results (from the past 240 hour(s))  MRSA PCR SCREENING     Status: Normal   Collection Time   08/29/11 11:08 PM      Component Value Range Status Comment   MRSA by PCR NEGATIVE  NEGATIVE  Final   CLOSTRIDIUM DIFFICILE BY PCR     Status: Normal   Collection Time   08/29/11 11:09 PM      Component Value Range Status Comment   C difficile by pcr NEGATIVE  NEGATIVE  Final      Studies/Results: Ct Abdomen Pelvis Wo Contrast  08/29/2011  *RADIOLOGY REPORT*  Clinical Data: Left flank pain.  Question stone.  CT ABDOMEN AND PELVIS WITHOUT CONTRAST  Technique:  Multidetector CT imaging of the abdomen and pelvis was performed following the standard protocol without intravenous contrast.  Comparison: Abdomen pelvis CT 12/08/2009  Findings: The imaged portion of the lower chest demonstrates left mastectomy with TRAM reconstruction and a right breast implant, partially visualized.  There is atelectasis and/or consolidation in both lower lobes, left greater than right, and there are linear bands of atelectasis or scarring in the right middle lobe and lingula.  There is an open anterior abdominal  incision, with some packing/bandage material seen within it.  No enhancing fluid collection is seen to suggest an abscess at the site of this open wound.  There is surgical suture associated with several loops of distal  ileum.   There may be some bowel wall thickening of the pelvic bowel loops which contain surgical suture (image number 65). These distal small bowel loops may be tethered anteriorly There appears to be a an anastomosis of small bowel with sigmoid colon on image number 71. There is no evidence of bowel obstruction.  It appears that the majority of the colon has been resected, with residual sigmoid colon and rectum present. Urinary bladder not very distended.  Hysterectomy.  Previously described ovarian masses on CT of December 2010 have been removed, there are surgical clips in the left adnexa.  No intraabdominal or intrapelvic abscess is seen.  The noncontrast appearance of the liver, spleen, adrenal glands, pancreas, and kidneys are within normal limits.  Specifically, there are no renal calculi and there is no hydronephrosis.  Both ureters are normal in caliber.  No ureteral stone is seen.  Abdominal aorta is normal in caliber.  IVC filter is present at the level of L1-L2.  There is no lymphadenopathy.  No acute bony abnormality peri  IMPRESSION: . 1.  Negative for urinary tract stones or obstruction. 2.  Large open midline abdominal surgical wound with packing/bandage material.  No evidence of abdominal wall abscess. 3.  Postsurgical changes of the bowel with suspected enterocolonic anastomosis at the level of the sigmoid colon with apparent surgical resection of the the remainder of the colon.  Some of the distal small bowel loops demonstrate some mild wall thickening and may be partially tethered to the anterior abdominal wall.  Findings may reflect recent postoperative changes, inflammation, or adhesion formation.  There is no evidence of bowel obstruction.  4.  Bilateral lower lobe  atelectasis and/or consolidation.  Original Report Authenticated By: Britta Mccreedy, M.D.   Nm Pulmonary Perfusion  08/29/2011  *RADIOLOGY REPORT*  Clinical Data:  50 year old female with chest pain and shortness of breath.  NUCLEAR MEDICINE VENTILATION - PERFUSION LUNG SCAN  Technique:  Wash-in, equilibrium, and wash-out phase ventilation images were obtained using Xe-133 gas.  Perfusion images were obtained in multiple projections after intravenous injection of Tc- 45m MAA.  Radiopharmaceuticals:  13.5 mCi Xe-133 gas and 5.0 mCi Tc-63m MAA.  Comparison:  08/29/2011 chest radiograph.  Findings: There is a perfusion defect involving the entire lingula. There is decreased ventilation in this area. Opacity within the lingula on recent chest radiograph is also noted. No other perfusion or ventilation abnormalities are present.  IMPRESSION: Triple matched defect of the lingula - intermediate probability for pulmonary embolus  (20-79%).  Original Report Authenticated By: Rosendo Gros, M.D.   US Venous Img Lower Bilateral  08/30/2011  *RADIOLOGY REPORT*  Clinical Data: Lower extremity swelling.  History of right upper extremity DVT.  Headache.  Pulmonary embolism.  VENOUS DUPLEX ULTRASOUND OF BILATERAL LOWER EXTREMITIES  Technique:  Gray-scale sonography with graded compression, as well as color Doppler and duplex ultrasound, were performed to evaluate the deep venous system of both lower extremities from the level of the common femoral vein through the popliteal and proximal calf veins.  Spectral Doppler was utilized to evaluate flow at rest and with distal augmentation maneuvers.  Comparison:  None.  Findings: Normal flow, compressibility, augmentation, within the distal common femoral, proximal greater saphenous/profunda femoral, entire femoral, popliteal, and imaged calf veins.  IMPRESSION: No evidence of lower extremity deep venous thrombosis bilaterally.  Original Report Authenticated By: Consuello Bossier, M.D.    Dg Chest Portable 1 View  08/29/2011  *RADIOLOGY REPORT*  Clinical Data: Shortness of breath and chest pain.  Recent surgery.  PORTABLE CHEST - 1 VIEW  Comparison: 08/11/2009  Findings: The cardiopericardial silhouette is within normal limits. Increased lingular opacity is identified - question atelectasis/collapse or consolidation. Mild airspace disease/atelectasis in the right lower lung is noted. There is no evidence of pleural effusion or pneumothorax.  IMPRESSION: Lingular atelectasis versus consolidation.  Mild airspace disease/atelectasis in the right lower lung.  Original Report Authenticated By: Rosendo Gros, M.D.    Medications: Scheduled Meds:   . enoxaparin (LOVENOX) injection  1 mg/kg Subcutaneous Q12H  . levothyroxine  100 mcg Oral Daily  . pantoprazole  40 mg Oral Q1200  . potassium chloride  40 mEq Oral Once  . simvastatin  40 mg Oral q1800  . sodium chloride       Continuous Infusions:   . sodium chloride 100 mL/hr at 08/30/11 1958   PRN Meds:.acetaminophen, acetaminophen, albuterol, ALPRAZolam, cyclobenzaprine, HYDROcodone-acetaminophen, HYDROmorphone (DILAUDID) injection, ondansetron (ZOFRAN) IV, ondansetron, DISCONTD: morphine  Assessment/Plan:  Principal Problem:  *Pulmonary embolism Active Problems:  Unspecified hypothyroidism  ANEMIA, IRON DEFICIENCY, CHRONIC  ANXIETY, CHRONIC  Abdominal wound dehiscence  #  1 pulmonary embolism: Please see previous note for details. For now we'll continue only on Lovenox, will not start Coumadin until we obtain records from [facility] ([place] Patient very reluctant to be transferred back to [facility] given prior hospitalization with severe complications.   LOS: 2 days   [name],[name] 08/31/2011, 10:03 AM

## 2011-08-31 NOTE — Progress Notes (Signed)
ANTICOAGULATION CONSULT NOTE - Follow Up Consult  Pharmacy Consult for Lovenox Indication: pulmonary embolus  Patient Measurements: Height: 5\' 4"  (162.6 cm) Weight: 218 lb 0.6 oz (98.9 kg) IBW/kg (Calculated) : 54.7    Vital Signs: Temp: 98.9 F (37.2 C) (09/03 0558) Temp src: Axillary (09/03 0558) BP: 106/71 mmHg (09/03 0558) Pulse Rate: 92  (09/03 0558)  Labs:  Basename 08/31/11 0542 08/30/11 0433 08/29/11 1431  HGB 9.7* 9.8* --  HCT 30.0* 30.4* 33.4*  PLT 297 343 370  APTT -- -- --  LABPROT -- -- --  INR -- -- --  HEPARINUNFRC -- -- --  CREATININE -- 1.28* 1.30*  CRCLEARANCE -- -- --  CKTOTAL -- -- --  CKMB -- -- --  TROPONINI -- -- --    Medications:  Scheduled:    . enoxaparin (LOVENOX) injection  1 mg/kg Subcutaneous Q12H  . levothyroxine  100 mcg Oral Daily  . pantoprazole  40 mg Oral Q1200  . potassium chloride  40 mEq Oral Once  . simvastatin  40 mg Oral q1800  . sodium chloride        Assessment: Lovenox treatment dose No coumadin at this time  Goal of Therapy:  Lovenox treatment dose   Plan:  Lovenox treatment dose CBC every 3rd day  [name] 08/31/2011,9:14 AM

## 2011-08-31 NOTE — Progress Notes (Signed)
Physical Therapy Evaluation Patient Name: [name] Date: 08/31/2011 Problem List:  Patient Active Problem List  Diagnoses  . ADENOCARCINOMA, BREAST, LEFT  . Unspecified hypothyroidism  . HYPERLIPIDEMIA  . OBESITY  . ANEMIA, IRON DEFICIENCY, CHRONIC  . ANXIETY, CHRONIC  . DEPRESSION  . CARPAL TUNNEL SYNDROME, RIGHT  . ACUTE CYSTITIS  . OVARIAN CYST  . NECK PAIN, CHRONIC  . BACK PAIN, CHRONIC  . FIBROMYALGIA  . Other Malaise and Fatigue  . UNSPECIFIED TACHYCARDIA  . SUPRAVENTRICULAR TACHYCARDIA, HX OF  . SMALL BOWEL OBSTRUCTION, HX OF  . INTERSTITIAL CYSTITIS  . DISORDER OF BONE AND CARTILAGE UNSPECIFIED  . IMPAIRED FASTING GLUCOSE  . Fistula  . Acute renal insufficiency  . Pulmonary embolism  . Abdominal wound dehiscence   Past Medical History:  Past Medical History  Diagnosis Date  . SVT (supraventricular tachycardia)   . Hypercholesterolemia   . Bilateral ovarian cysts   . Perforation bowel   . PE (pulmonary embolism)   . Thyroid disease   . GERD (gastroesophageal reflux disease)   . Cancer   . Sepsis   . E coli infection   . Acute renal failure   . Anxiety   . Pneumonia    Past Surgical History:  Past Surgical History  Procedure Date  . Ablasion   . Vena cava filter placement   . Abdominal hysterectomy   . Hernia repair   . Colon surgery   . Bowel resection     Precautions/Restrictions    Prior Functioning  Home Living Type of Home: Apartment Lives With: Spouse Receives Help From: Family Home Layout: One level Home Access: Level entry Bathroom Shower/Tub: Engineer, manufacturing systems: Standard Bathroom Accessibility: Yes Home Adaptive Equipment: None Prior Function Level of Independence: Independent with basic ADLs Driving: Yes Cognition Cognition Arousal/Alertness: Awake/alert Overall Cognitive Status: Appears within functional limits for tasks assessed Sensation/Coordination Sensation Light Touch: Appears  Intact Hot/Cold: Appears Intact Proprioception: Appears Intact Extremity Assessment RUE Assessment RUE Assessment: Within Functional Limits LUE Assessment LUE Assessment: Within Functional Limits RLE Assessment RLE Assessment: Within Functional Limits LLE Assessment LLE Assessment: Within Functional Limits Mobility (including Balance) Bed Mobility Bed Mobility: Yes Rolling Right: 7: Independent Right Sidelying to Sit: 7: Independent Sitting - Scoot to Edge of Bed: 7: Independent Transfers Transfers: Yes Sit to Stand: 7: Independent Stand to Sit: 7: Independent Stand Pivot Transfers: 7: Independent Ambulation/Gait Ambulation/Gait: Yes Ambulation/Gait Assistance: 5: Supervision Ambulation Distance (Feet): 100 Feet Assistive device: None Gait Pattern: Within Functional Limits Gait velocity: slow Stairs: No Wheelchair Mobility Wheelchair Mobility: No    Exercise  Total Joint Exercises Ankle Circles/Pumps: AROM;10 reps;Seated Quad Sets: Both;10 reps Gluteal Sets: Both;10 reps Long Arc Quad: Strengthening;Both;10 reps General Exercises - Lower Extremity Ankle Circles/Pumps: AROM;10 reps;Seated Quad Sets: Both;10 reps Gluteal Sets: Both;10 reps Long Arc Quad: Strengthening;Both;10 reps Low Level/ICU Exercises Ankle Circles/Pumps: AROM;10 reps;Seated Quad Sets: Both;10 reps  End of Session PT - End of Session Activity Tolerance: Patient tolerated treatment well Patient left: in chair;with call bell in reach General Behavior During Session: [facility] for tasks performed Cognition: [facility] for tasks performed PT Assessment/Plan/Recommendation PT Assessment Clinical Impression Statement: Pt I with mobility will see 3x/wk to improve steadiness  PT Goals  Acute Rehab PT Goals PT Goal Formulation: With patient Pt will Ambulate: >150 feet;Independently Additional Goals Additional Goal #1: I HEP ankle pump, LAQ, quad set and glut sets to help prevent further  clots RUSSELL,CINDY 08/31/2011, 3:37 PM

## 2011-08-31 NOTE — Progress Notes (Deleted)
Physical Therapy Evaluation Patient Name: [name]'F Date: 08/31/2011 Problem List:  Patient Active Problem List  Diagnoses  . ADENOCARCINOMA, BREAST, LEFT  . Unspecified hypothyroidism  . HYPERLIPIDEMIA  . OBESITY  . ANEMIA, IRON DEFICIENCY, CHRONIC  . ANXIETY, CHRONIC  . DEPRESSION  . CARPAL TUNNEL SYNDROME, RIGHT  . ACUTE CYSTITIS  . OVARIAN CYST  . NECK PAIN, CHRONIC  . BACK PAIN, CHRONIC  . FIBROMYALGIA  . Other Malaise and Fatigue  . UNSPECIFIED TACHYCARDIA  . SUPRAVENTRICULAR TACHYCARDIA, HX OF  . SMALL BOWEL OBSTRUCTION, HX OF  . INTERSTITIAL CYSTITIS  . DISORDER OF BONE AND CARTILAGE UNSPECIFIED  . IMPAIRED FASTING GLUCOSE  . Fistula  . Acute renal insufficiency  . Pulmonary embolism  . Abdominal wound dehiscence   Past Medical History:  Past Medical History  Diagnosis Date  . SVT (supraventricular tachycardia)   . Hypercholesterolemia   . Bilateral ovarian cysts   . Perforation bowel   . PE (pulmonary embolism)   . Thyroid disease   . GERD (gastroesophageal reflux disease)   . Cancer   . Sepsis   . E coli infection   . Acute renal failure   . Anxiety   . Pneumonia    Past Surgical History:  Past Surgical History  Procedure Date  . Ablasion   . Vena cava filter placement   . Abdominal hysterectomy   . Hernia repair   . Colon surgery   . Bowel resection     Precautions/Restrictions    Prior Functioning   Ambulatory in house only.   Cognition Cognition Arousal/Alertness: Awake/alert Overall Cognitive Status: Appears within functional limits for tasks assessed Sensation/Coordination Sensation Light Touch: Appears Intact Hot/Cold: Appears Intact Proprioception: Appears Intact Extremity Assessment RUE Assessment RUE Assessment: Within Functional Limits LUE Assessment LUE Assessment: Within Functional Limits RLE Assessment RLE Assessment: Within Functional Limits LLE Assessment LLE Assessment: Within Functional  Limits Mobility (including Balance) Bed Mobility Bed Mobility: Yes Rolling Right: 7: Independent Right Sidelying to Sit: 7: Independent Sitting - Scoot to Edge of Bed: 7: Independent Transfers Transfers: Yes Sit to Stand: 7: Independent Stand to Sit: 7: Independent Stand Pivot Transfers: 7: Independent Ambulation/Gait Ambulation/Gait: Yes Ambulation/Gait Assistance: 5: Supervision Ambulation Distance (Feet): 100 Feet Assistive device: None Gait Pattern: Within Functional Limits Gait velocity: slow Stairs: No Wheelchair Mobility Wheelchair Mobility: No    Exercise  Total Joint Exercises Ankle Circles/Pumps: AROM;10 reps;Seated Quad Sets: Both;10 reps Gluteal Sets: Both;10 reps Long Arc Quad: Strengthening;Both;10 reps General Exercises - Lower Extremity Ankle Circles/Pumps: AROM;10 reps;Seated Quad Sets: Both;10 reps Gluteal Sets: Both;10 reps Long Arc Quad: Strengthening;Both;10 reps Low Level/ICU Exercises Ankle Circles/Pumps: AROM;10 reps;Seated Quad Sets: Both;10 reps  End of Session PT - End of Session Activity Tolerance: Patient tolerated treatment well Patient left: in chair;with call bell in reach General Behavior During Session: [facility] for tasks performed Cognition: [facility] for tasks performed PT Assessment/Plan/Recommendation PT Assessment Clinical Impression Statement: Pt I with mobility will see 3x/wk to improve steadiness  PT Goals  Acute Rehab PT Goals PT Goal Formulation: With patient Pt will Ambulate: >150 feet;Independently Additional Goals Additional Goal #1: I HEP ankle pump, LAQ, quad set and glut sets to help prevent further clots [name],CINDY 08/31/2011, 3:34 PM

## 2011-08-31 NOTE — Progress Notes (Signed)
INITIAL ADULT NUTRITION ASSESSMENT Date: 08/31/2011   Time: 5:12 PM Reason for Assessment: Unplanned wt loss  ASSESSMENT: Female 50 y.o.  Dx: Pulmonary embolism   Past Medical History  Diagnosis Date  . SVT (supraventricular tachycardia)   . Hypercholesterolemia   . Bilateral ovarian cysts   . Perforation bowel   . PE (pulmonary embolism)   . Thyroid disease   . GERD (gastroesophageal reflux disease)   . Cancer   . Sepsis   . E coli infection   . Acute renal failure   . Anxiety   . Pneumonia     Scheduled Meds:   . enoxaparin (LOVENOX) injection  1 mg/kg Subcutaneous Q12H  . levothyroxine  100 mcg Oral Daily  . pantoprazole  40 mg Oral Q1200  . simvastatin  40 mg Oral q1800  . sodium chloride       Continuous Infusions:   . sodium chloride 100 mL/hr at 08/30/11 1958   PRN Meds:.acetaminophen, acetaminophen, albuterol, ALPRAZolam, cyclobenzaprine, HYDROcodone-acetaminophen, HYDROmorphone (DILAUDID) injection, ondansetron (ZOFRAN) IV, ondansetron  Ht: 5\' 4"  (162.6 cm)  Wt: 218 lb 0.6 oz (98.9 kg)  Ideal Wt: 54.7 kg 59.2 kg % Ideal Wt:181%  Usual Wt: 252# % Usual Wt: 87%  Body mass index is 37.43 kg/(m^2).  Food/Nutrition Related Hx: Pt awake in bed with family present. Currently eating Ham or [employer]. Reports to have eaten a double cheeseburger the past two days and tol well. She reported signifcant unplanned wt loss of 34#, 13% during Admission hx assessment. Says that she has already talked with 2 nutritionists at [facility] and was told to eat whatever she wanted. She's receiving a Regular diet, consuming 0-75% meals also receiving outside food as well. She is lactose intol as well and will receive lactose-free milk in am with breakfast. Pt is at nutrition risk r/t severe unplanned wt loss. However po intake is likely adequate to meet est energy and protein needs given her high fat, high protein food outside food choices.  Labs:  BMET    Component Value  Date/Time   NA 135 08/30/2011 0433   K 3.2* 08/30/2011 0433   CL 101 08/30/2011 0433   CO2 24 08/30/2011 0433   GLUCOSE 103* 08/30/2011 0433   BUN 13 08/30/2011 0433   CREATININE 1.28* 08/30/2011 0433   CREATININE 1.49* 08/28/2011 1045   CALCIUM 8.6 08/30/2011 0433   GFRNONAA 44* 08/30/2011 0433   GFRAA 54* 08/30/2011 0433   CBC    Component Value Date/Time   WBC 5.5 08/31/2011 0542   RBC 3.39* 08/31/2011 0542   HGB 9.7* 08/31/2011 0542   HCT 30.0* 08/31/2011 0542   PLT 297 08/31/2011 0542   MCV 88.5 08/31/2011 0542   MCH 28.6 08/31/2011 0542   MCHC 32.3 08/31/2011 0542   RDW 14.8 08/31/2011 0542   LYMPHSABS 2.1 08/28/2011 1045   MONOABS 0.6 08/28/2011 1045   EOSABS 0.2 08/28/2011 1045   BASOSABS 0.0 08/28/2011 1045    Intake/Output Summary (Last 24 hours) at 08/31/11 1725 Last data filed at 08/31/11 1200  Gross per 24 hour  Intake    580 ml  Output    550 ml  Net     30 ml    Diet Order: General-Regular diet (0-75% meals) Note does not include outside foods.  Supplements/Tube Feeding:none at this time   IVF:    sodium chloride Last Rate: 100 mL/hr at 08/30/11 1958    Estimated Nutritional Needs:   Kcal: 1900-2100 Protein: 80-90 grams  Fluid: 1.9-2.1 L/d  NUTRITION DIAGNOSIS: -Inadequate oral intake (NI-2.1).  Status: Resolved  RELATED TO:  -recent previous hospitalization  AS EVIDENCE BY:  - reported 3 wk stay at [facility] with 34# wt loss  MONITORING/EVALUATION(Goals): -Pt will cont to consume adequate energy and protein to meet est needs  EDUCATION NEEDS: -Education not appropriate at this time  INTERVENTION: -Add MVI daily  Dietitian [phone]  DOCUMENTATION CODES Per approved criteria  -Obesity Unspecified    Francene Boyers 08/31/2011, 5:12 PM

## 2011-09-01 DIAGNOSIS — Z79899 Other long term (current) drug therapy: Secondary | ICD-10-CM

## 2011-09-01 DIAGNOSIS — I2699 Other pulmonary embolism without acute cor pulmonale: Secondary | ICD-10-CM

## 2011-09-01 DIAGNOSIS — Z9889 Other specified postprocedural states: Secondary | ICD-10-CM

## 2011-09-01 DIAGNOSIS — R079 Chest pain, unspecified: Secondary | ICD-10-CM

## 2011-09-01 DIAGNOSIS — R942 Abnormal results of pulmonary function studies: Secondary | ICD-10-CM

## 2011-09-01 LAB — URINE CULTURE
Colony Count: NO GROWTH
Culture  Setup Time: 201209022323
Culture: NO GROWTH

## 2011-09-01 LAB — D-DIMER, QUANTITATIVE: D-Dimer, Quant: 0.42 ug/mL-FEU (ref 0.00–0.48)

## 2011-09-01 MED ORDER — DIPHENHYDRAMINE HCL 25 MG PO CAPS
25.0000 mg | ORAL_CAPSULE | Freq: Once | ORAL | Status: AC
  Administered 2011-09-01: 25 mg via ORAL
  Filled 2011-09-01: qty 1

## 2011-09-01 NOTE — Progress Notes (Signed)
Subjective: Still complains of left upper back pain. Otherwise no new events.  Objective: Vital signs in last 24 hours: Temp:  [98.2 F (36.8 C)-99.1 F (37.3 C)] 99.1 F (37.3 C) (09/04 0600) Pulse Rate:  [88-95] 95  (09/04 0600) Resp:  [18-24] 24  (09/04 0600) BP: (104-120)/(63-76) 104/63 mmHg (09/04 0600) SpO2:  [95 %-96 %] 95 % (09/04 0600) Weight change:  Last BM Date: 08/31/11  Intake/Output from previous day: 09/03 0701 - 09/04 0700 In: 220 [P.O.:220] Out: 500 [Urine:500]     Physical Exam: General: Alert, awake, oriented x3. HEENT: No bruits, no goiter. Heart: Regular rate and rhythm, without murmurs, rubs, gallops. Lungs: Clear to auscultation bilaterally. Abdomen: Large abdominal wall defect. Extremities: No clubbing cyanosis or edema with positive pedal pulses. Neuro: Grossly intact, nonfocal.    Lab Results:  Basename 08/31/11 0542 08/30/11 0433  WBC 5.5 6.0  HGB 9.7* 9.8*  HCT 30.0* 30.4*  PLT 297 343    Basename 08/30/11 0433 08/29/11 1431  NA 135 135  K 3.2* 3.9  CL 101 101  CO2 24 23  GLUCOSE 103* 102*  BUN 13 14  CREATININE 1.28* 1.30*  CALCIUM 8.6 9.1   Recent Results (from the past 240 hour(s))  URINE CULTURE     Status: Normal   Collection Time   08/29/11  8:27 PM      Component Value Range Status Comment   Specimen Description URINE, CATHETERIZED   Final    Special Requests NONE   Final    Setup Time [id number]   Final    Colony Count NO GROWTH   Final    Culture NO GROWTH   Final    Report Status 09/01/2011 FINAL   Final   MRSA PCR SCREENING     Status: Normal   Collection Time   08/29/11 11:08 PM      Component Value Range Status Comment   MRSA by PCR NEGATIVE  NEGATIVE  Final   CLOSTRIDIUM DIFFICILE BY PCR     Status: Normal   Collection Time   08/29/11 11:09 PM      Component Value Range Status Comment   C difficile by pcr NEGATIVE  NEGATIVE  Final      Studies/Results: US Venous Img Lower Bilateral  08/30/2011   *RADIOLOGY REPORT*  Clinical Data: Lower extremity swelling.  History of right upper extremity DVT.  Headache.  Pulmonary embolism.  VENOUS DUPLEX ULTRASOUND OF BILATERAL LOWER EXTREMITIES  Technique:  Gray-scale sonography with graded compression, as well as color Doppler and duplex ultrasound, were performed to evaluate the deep venous system of both lower extremities from the level of the common femoral vein through the popliteal and proximal calf veins.  Spectral Doppler was utilized to evaluate flow at rest and with distal augmentation maneuvers.  Comparison:  None.  Findings: Normal flow, compressibility, augmentation, within the distal common femoral, proximal greater saphenous/profunda femoral, entire femoral, popliteal, and imaged calf veins.  IMPRESSION: No evidence of lower extremity deep venous thrombosis bilaterally.  Original Report Authenticated By: Consuello Bossier, M.D.    Medications: Scheduled Meds:   . enoxaparin (LOVENOX) injection  1 mg/kg Subcutaneous Q12H  . levothyroxine  100 mcg Oral Daily  . multivitamin  5 mL Oral Daily  . pantoprazole  40 mg Oral Q1200  . simvastatin  40 mg Oral q1800   Continuous Infusions:  PRN Meds:.acetaminophen, acetaminophen, albuterol, ALPRAZolam, cyclobenzaprine, HYDROcodone-acetaminophen, HYDROmorphone (DILAUDID) injection, ondansetron (ZOFRAN) IV, ondansetron  Assessment/Plan:  Principal Problem:  *  Pulmonary embolism Active Problems:  Unspecified hypothyroidism  ANEMIA, IRON DEFICIENCY, CHRONIC  ANXIETY, CHRONIC  Abdominal wound dehiscence  #1 questionable PE: Unable to perform CT scan given acute renal failure. VQ scan has come back with intermediate probability for PE, lower extremity Dopplers are negative for DVT. Patient's history is positive for a prior episode of DVT. She has an IVC filter in place. She has a large abdominal wall defect following surgery at [facility] that was apparently complicated by bowel perforation.  Abdominal wound has been left to heal by second intention. I am inclined to anticoagulate her with Coumadin given her prior episode of DVT and recent prolonged hospitalization at [facility] put her at high risk. Patient is uncomfortable about starting Coumadin because she is afraid of bleeding secondary to her now complicated bowel anatomy. I have discussed her case with Dr. Syliva Overman, who is her primary care provider, she favors long-term anticoagulation with Coumadin. Patient has also requested that Dr. Dietrich Pates, who previously managed her Coumadin, weigh in on the decision-making process. Dr. Shaune Pollack, pulmonology, has also seen her and is waiting for cardiology recommendation as well. Patient was offered transfer to [facility], however she refuses given her recent bad experience there. For the time being I have her on full doses of Lovenox. If the final decision is to start Coumadin, then I would favor keeping her in the hospital until her INR were therapeutic.   LOS: 3 days   HERNANDEZ ACOSTA,ESTELA 09/01/2011, 11:30 AM

## 2011-09-01 NOTE — Progress Notes (Signed)
UR Chart Review Completed  

## 2011-09-01 NOTE — Progress Notes (Signed)
Physical Therapy Treatment Patient Name: TIAWANA FORGY WUJWJ'X Date: 09/01/2011 TIME: 1240-100 CHARGES: 1 TE 1 GT Problem List:  Patient Active Problem List  Diagnoses  . ADENOCARCINOMA, BREAST, LEFT  . Unspecified hypothyroidism  . HYPERLIPIDEMIA  . OBESITY  . ANEMIA, IRON DEFICIENCY, CHRONIC  . ANXIETY, CHRONIC  . DEPRESSION  . CARPAL TUNNEL SYNDROME, RIGHT  . ACUTE CYSTITIS  . OVARIAN CYST  . NECK PAIN, CHRONIC  . BACK PAIN, CHRONIC  . FIBROMYALGIA  . Other Malaise and Fatigue  . UNSPECIFIED TACHYCARDIA  . SUPRAVENTRICULAR TACHYCARDIA, HX OF  . SMALL BOWEL OBSTRUCTION, HX OF  . INTERSTITIAL CYSTITIS  . DISORDER OF BONE AND CARTILAGE UNSPECIFIED  . IMPAIRED FASTING GLUCOSE  . Fistula  . Acute renal insufficiency  . Pulmonary embolism  . Abdominal wound dehiscence   Past Medical History:  Past Medical History  Diagnosis Date  . SVT (supraventricular tachycardia)   . Hypercholesterolemia   . Bilateral ovarian cysts   . Perforation bowel   . PE (pulmonary embolism)   . Thyroid disease   . GERD (gastroesophageal reflux disease)   . Cancer   . Sepsis   . E coli infection   . Acute renal failure   . Anxiety   . Pneumonia    Past Surgical History:  Past Surgical History  Procedure Date  . Ablasion   . Vena cava filter placement   . Abdominal hysterectomy   . Hernia repair   . Colon surgery   . Bowel resection    Precautions/Restrictions  Restrictions Weight Bearing Restrictions: No Mobility (including Balance) Ambulation/Gait Ambulation/Gait: Yes Ambulation/Gait Assistance Details (indicate cue type and reason): HHA supervision;vc's to widen BOS Ambulation Distance (Feet): 140 Feet Assistive device: None Gait Pattern: Step-through pattern (pt tends to walk in a modified tandem pattern) Gait velocity: slow Stairs: No Wheelchair Mobility Wheelchair Mobility: No    Exercise  Total Joint Exercises Ankle Circles/Pumps: Both;20 reps;Seated Long Arc  Quad: Seated;20 reps;Both General Exercises - Lower Extremity Ankle Circles/Pumps: Both;20 reps;Seated Long Arc Quad: Seated;20 reps;Both Toe Raises: Seated;Both;20 reps Mini-Sqauts: Seated;Both;20 reps Low Level/ICU Exercises Ankle Circles/Pumps: Both;20 reps;Seated  End of Session   PT Assessment/Plan  PT - Assessment/Plan Comments on Treatment Session: Pt tolerated all treatment well;increased gait distance from yesterday;no rest breaks needed;pt does tend to walk with a modified tandem pattern which narrows her BOS;No LOB PT Goals  Acute Rehab PT Goals PT Goal: Ambulate - Progress: Progressing toward goal  Florentino Laabs ATKINSO 09/01/2011, 1:06 PM

## 2011-09-01 NOTE — Consult Note (Signed)
Consult requested by: Hospitalist service Consult requested for pulmonary embolism:  HPI: This is a 50 year old Caucasian female who has multiple medical problems including previous history of pulmonary embolus but apparently was from cardiac origin. She also has a history of breast cancer ovarian tumor and multiple surgeries. She has had a wound dehiscence has a wound bag and has multiple other medical problems including a history of sepsis renal failure which has not required surgery as yet. She's also had inferior vena cava filter and has been on Coumadin in the past but this was complicated by hemorrhage.  Past Medical History  Diagnosis Date  . SVT (supraventricular tachycardia)   . Hypercholesterolemia   . Bilateral ovarian cysts   . Perforation bowel   . PE (pulmonary embolism)   . Thyroid disease   . GERD (gastroesophageal reflux disease)   . Cancer   . Sepsis   . E coli infection   . Acute renal failure   . Anxiety   . Pneumonia      History reviewed. No pertinent family history.   History   Social History  . Marital Status: Married    Spouse Name: N/A    Number of Children: N/A  . Years of Education: N/A   Social History Main Topics  . Smoking status: Never Smoker   . Smokeless tobacco: None  . Alcohol Use: No  . Drug Use: No  . Sexually Active: None   Other Topics Concern  . None   Social History Narrative  . None     ROS: She complains of pain in the left side of her chest.    Objective: Vital signs in last 24 hours: Temp:  [98.2 F (36.8 C)-99.1 F (37.3 C)] 99.1 F (37.3 C) (09/04 0600) Pulse Rate:  [88-95] 95  (09/04 0600) Resp:  [18-24] 24  (09/04 0600) BP: (104-120)/(63-76) 104/63 mmHg (09/04 0600) SpO2:  [95 %-96 %] 95 % (09/04 0600) Weight change:  Last BM Date: 08/31/11  Intake/Output from previous day: 09/03 0701 - 09/04 0700 In: 220 [P.O.:220] Out: 500 [Urine:500]  PHYSICAL EXAM She looks fairly comfortable. She does not  seem to be in any respiratory distress. Her chest is fairly clear without wheezes. Her heart is regular without murmur gallop or rub. Her abdomen shows chronic findings from her multiple surgeries. She does not have any edema Central nervous system examination is grossly intact  Lab Results: Basic Metabolic Panel:  Basename 08/30/11 0433 08/29/11 1431  NA 135 135  K 3.2* 3.9  CL 101 101  CO2 24 23  GLUCOSE 103* 102*  BUN 13 14  CREATININE 1.28* 1.30*  CALCIUM 8.6 9.1  MG -- --  PHOS -- --   Liver Function Tests:  Basename 08/30/11 0433 08/29/11 1431  AST 11 12  ALT 10 11  ALKPHOS 105 109  BILITOT 0.3 0.4  PROT 5.9* 6.6  ALBUMIN 2.7* 3.1*   No results found for this basename: LIPASE:2,AMYLASE:2 in the last 72 hours No results found for this basename: AMMONIA:2 in the last 72 hours CBC:  Basename 08/31/11 0542 08/30/11 0433  WBC 5.5 6.0  NEUTROABS -- --  HGB 9.7* 9.8*  HCT 30.0* 30.4*  MCV 88.5 88.9  PLT 297 343   Cardiac Enzymes: No results found for this basename: CKTOTAL:3,CKMB:3,CKMBINDEX:3,TROPONINI:3 in the last 72 hours BNP: No results found for this basename: POCBNP:3 in the last 72 hours D-Dimer: No results found for this basename: DDIMER:2 in the last 72 hours CBG:  No results found for this basename: GLUCAP:6 in the last 72 hours Hemoglobin A1C: No results found for this basename: HGBA1C in the last 72 hours Fasting Lipid Panel: No results found for this basename: CHOL,HDL,LDLCALC,TRIG,CHOLHDL,LDLDIRECT in the last 72 hours Thyroid Function Tests:  Basename 08/29/11 1431  TSH --  T4TOTAL --  FREET4 1.43  T3FREE --  THYROIDAB --   Anemia Panel:  Basename 08/29/11 1431  VITAMINB12 683  FOLATE 4.3  FERRITIN 103  TIBC Not calculated due to Iron <10.  IRON <10*  RETICCTPCT 1.4   Urine Drug Screen:  Alcohol Level: No results found for this basename: ETH:2 in the last 72 hours Urinalysis:  Misc. Labs:   ABGS: No results found for this  basename: PHART,PCO2,PO2ART,TCO2,HCO3 in the last 72 hours   MICROBIOLOGY: Recent Results (from the past 240 hour(s))  URINE CULTURE     Status: Normal   Collection Time   08/29/11  8:27 PM      Component Value Range Status Comment   Specimen Description URINE, CATHETERIZED   Final    Special Requests NONE   Final    Setup Time [id number]   Final    Colony Count NO GROWTH   Final    Culture NO GROWTH   Final    Report Status 09/01/2011 FINAL   Final   MRSA PCR SCREENING     Status: Normal   Collection Time   08/29/11 11:08 PM      Component Value Range Status Comment   MRSA by PCR NEGATIVE  NEGATIVE  Final   CLOSTRIDIUM DIFFICILE BY PCR     Status: Normal   Collection Time   08/29/11 11:09 PM      Component Value Range Status Comment   C difficile by pcr NEGATIVE  NEGATIVE  Final     Studies/Results: US Venous Img Lower Bilateral  08/30/2011  *RADIOLOGY REPORT*  Clinical Data: Lower extremity swelling.  History of right upper extremity DVT.  Headache.  Pulmonary embolism.  VENOUS DUPLEX ULTRASOUND OF BILATERAL LOWER EXTREMITIES  Technique:  Gray-scale sonography with graded compression, as well as color Doppler and duplex ultrasound, were performed to evaluate the deep venous system of both lower extremities from the level of the common femoral vein through the popliteal and proximal calf veins.  Spectral Doppler was utilized to evaluate flow at rest and with distal augmentation maneuvers.  Comparison:  None.  Findings: Normal flow, compressibility, augmentation, within the distal common femoral, proximal greater saphenous/profunda femoral, entire femoral, popliteal, and imaged calf veins.  IMPRESSION: No evidence of lower extremity deep venous thrombosis bilaterally.  Original Report Authenticated By: Consuello Bossier, M.D.    Medications:  Prior to Admission:  Prescriptions prior to admission  Medication Sig Dispense Refill  . ALPRAZolam (XANAX) 1 MG tablet Take 1 mg by mouth 4 (four)  times daily.        . butalbital-acetaminophen-caffeine (FIORICET, ESGIC) 50-325-40 MG per tablet Take 1 tablet by mouth 2 (two) times daily as needed. For migraines       . cyclobenzaprine (FLEXERIL) 10 MG tablet Take 10 mg by mouth 3 (three) times daily as needed. For fibromyalgia        . HYDROcodone-acetaminophen (NORCO) 10-325 MG per tablet       . levothyroxine (SYNTHROID) 100 MCG tablet Take 1 tablet (100 mcg total) by mouth daily.  90 tablet  0  . omeprazole (PRILOSEC) 40 MG capsule Take 1 capsule (40 mg total) by mouth daily.  90 capsule  1  . ondansetron (ZOFRAN) 4 MG tablet Take 4 mg by mouth every 8 (eight) hours as needed. For nausea        . pravastatin (PRAVACHOL) 80 MG tablet Take 1 tablet (80 mg total) by mouth every evening. Dose increase  90 tablet  0  . ibuprofen (ADVIL,MOTRIN) 800 MG tablet       . spironolactone (ALDACTONE) 25 MG tablet Take 1 tablet (25 mg total) by mouth 2 (two) times daily.  180 tablet  0   Scheduled:   . enoxaparin (LOVENOX) injection  1 mg/kg Subcutaneous Q12H  . levothyroxine  100 mcg Oral Daily  . multivitamin  5 mL Oral Daily  . pantoprazole  40 mg Oral Q1200  . simvastatin  40 mg Oral q1800   Continuous:  WUJ:[id number], acetaminophen, albuterol, ALPRAZolam, cyclobenzaprine, HYDROcodone-acetaminophen, HYDROmorphone (DILAUDID) injection, ondansetron (ZOFRAN) IV, ondansetron  Assesment: She has an abnormal ventilation/perfusion lung scan. Her venous study was negative. As best I can tell her previous clot was from cardiac origin so she needs to have an echocardiogram. She has consultation with cardiology at this point already. Her last pulmonary embolus was diagnosed with arteriography but I don't think that we can do that considering her renal function Principal Problem:  *Pulmonary embolism Active Problems:  Unspecified hypothyroidism  ANEMIA, IRON DEFICIENCY, CHRONIC  ANXIETY, CHRONIC  Abdominal wound dehiscence    Plan: I would  continue his current treatments will ask for cardiology consultation continue everything else and then further evaluation depending on results of above    LOS: 3 days   Rogen Porte L 09/01/2011, 8:54 AM

## 2011-09-01 NOTE — Telephone Encounter (Signed)
noted 

## 2011-09-01 NOTE — Progress Notes (Signed)
*  PRELIMINARY RESULTS* Echocardiogram 2D Echocardiogram has been performed.  [name] 09/01/2011, 4:29 PM 

## 2011-09-01 NOTE — Consult Note (Signed)
Reason for Consult:Diagnosis and RX of possible pulmonary embolism . Referring Physician: Dr. Harvie Heck [name] is an 50 y.o. female.  HPI: Pt well known to me as the result of prior treatment for PSVT and pulmonary embolism.  She is now admitted after a 1 month course at [facility] during which she suffered iatrogenic injury to bowel during an oophorectomy.  Subsequently developed sepsis, acute renal failure and probably a host of other complications.  Current anatomy is unclear, but she has a long abdominal wound draining stool that is being allowed to heal by secondary intent.  She developed severe low back pain on the day of admission exacerbated by inspiration prompting her to present to the ED.  VQ scan was read as intermediate probability, and full anticoagulation has been initiated.  Pain persists.  No dyspnea is present.  Past Medical History  Diagnosis Date  . SVT (supraventricular tachycardia)   . Hypercholesterolemia   . Bilateral ovarian cysts   . Perforation bowel   . PE (pulmonary embolism)   . Thyroid disease   . GERD (gastroesophageal reflux disease)   . Cancer   . Sepsis   . E coli infection   . Acute renal failure   . Anxiety   . Pneumonia     Past Surgical History  Procedure Date  . Ablasion   . Vena cava filter placement   . Abdominal hysterectomy   . Hernia repair   . Colon surgery   . Bowel resection     History reviewed. No pertinent family history.  Social History:  reports that she has never smoked. She does not have any smokeless tobacco history on file. She reports that she does not drink alcohol or use illicit drugs.  Allergies:  Allergies  Allergen Reactions  . Biaxin Other (See Comments)    Cluster migraines   . Clarithromycin Hives  . Cleocin Hives  . Codeine Itching  . Keflex (Cephalexin) Hives  . Nitrofurantoin Hives  . Penicillins Hives  . Scopolamine Hbr Other (See Comments)    Cluster migraines  . Sulfonamide  Derivatives Hives  . Tape Other (See Comments)    Blistering, use paper tape     Results for orders placed during the hospital encounter of 08/29/11 (from the past 48 hour(s))  CBC     Status: Abnormal   Collection Time   08/31/11  5:42 AM      Component Value Range Comment   WBC 5.5  4.0 - 10.5 (K/uL)    RBC 3.39 (*) 3.87 - 5.11 (MIL/uL)    Hemoglobin 9.7 (*) 12.0 - 15.0 (g/dL)    HCT 40.9 (*) 81.1 - 46.0 (%)    MCV 88.5  78.0 - 100.0 (fL)    MCH 28.6  26.0 - 34.0 (pg)    MCHC 32.3  30.0 - 36.0 (g/dL)    RDW 91.4  78.2 - 95.6 (%)    Platelets 297  150 - 400 (K/uL)     Blood pressure 102/65, pulse 92, temperature 98.5 F (36.9 C), temperature source Oral, resp. rate 20, height 5\' 4"  (1.626 m), weight 98.9 kg (218 lb 0.6 oz), SpO2 93.00%. General-Well developed; no acute distress Body habitus-overweight Neck-No JVD; no carotid bruits Lungs-left basilar rales; resonant to percussion; decreased breath sounds at the bases Cardiovascular-normal PMI; normal S1 and S2 Abdomen-normal bowel sounds; soft and non-tender; large open area anteriorly draining brown liquid stool and covered with a bag Musculoskeletal-No deformities, no cyanosis or  clubbing Neurologic-Normal cranial nerves; symmetric strength and tone Skin-Warm, no significant lesions Extremities-distal pulses intact; no edema  Assessment/Plan: Discussion with patient and husband for 45 minutes.  Patient not at high risk for anticoagulation at this point, but also does not appear to be at high risk for CT of chest with contrast.  I will request a d-dimer, review all radiographic studies and discuss case with the other physicians caring for [name].  My inclination is to proceed with a CT + contrast to reach an unequivocal or at least nearly unequivocal diagnosis.    Holly Hill Bing 09/01/2011, 7:51 PM

## 2011-09-01 NOTE — Progress Notes (Signed)
ANTICOAGULATION CONSULT NOTE -    Pharmacy Consult for Lovenox Indication: pulmonary embolus  Patient Measurements: Height: 5\' 4"  (162.6 cm) Weight: 218 lb 0.6 oz (98.9 kg) IBW/kg (Calculated) : 54.7   Vital Signs: Temp: 99.1 F (37.3 C) (09/04 0600) BP: 104/63 mmHg (09/04 0600) Pulse Rate: 95  (09/04 0600)  Labs:  Basename 08/31/11 0542 08/30/11 0433 08/29/11 1431  HGB 9.7* 9.8* --  HCT 30.0* 30.4* 33.4*  PLT 297 343 370  APTT -- -- --  LABPROT -- -- --  INR -- -- --  HEPARINUNFRC -- -- --  CREATININE -- 1.28* 1.30*  CRCLEARANCE -- -- --  CKTOTAL -- -- --  CKMB -- -- --  TROPONINI -- -- --    Medical History: Past Medical History  Diagnosis Date  . SVT (supraventricular tachycardia)   . Hypercholesterolemia   . Bilateral ovarian cysts   . Perforation bowel   . PE (pulmonary embolism)   . Thyroid disease   . GERD (gastroesophageal reflux disease)   . Cancer   . Sepsis   . E coli infection   . Acute renal failure   . Anxiety   . Pneumonia     Medications:  Prescriptions prior to admission  Medication Sig Dispense Refill  . ALPRAZolam (XANAX) 1 MG tablet Take 1 mg by mouth 4 (four) times daily.        . butalbital-acetaminophen-caffeine (FIORICET, ESGIC) 50-325-40 MG per tablet Take 1 tablet by mouth 2 (two) times daily as needed. For migraines       . cyclobenzaprine (FLEXERIL) 10 MG tablet Take 10 mg by mouth 3 (three) times daily as needed. For fibromyalgia        . HYDROcodone-acetaminophen (NORCO) 10-325 MG per tablet       . levothyroxine (SYNTHROID) 100 MCG tablet Take 1 tablet (100 mcg total) by mouth daily.  90 tablet  0  . omeprazole (PRILOSEC) 40 MG capsule Take 1 capsule (40 mg total) by mouth daily.  90 capsule  1  . ondansetron (ZOFRAN) 4 MG tablet Take 4 mg by mouth every 8 (eight) hours as needed. For nausea        . pravastatin (PRAVACHOL) 80 MG tablet Take 1 tablet (80 mg total) by mouth every evening. Dose increase  90 tablet  0  .  ibuprofen (ADVIL,MOTRIN) 800 MG tablet       . spironolactone (ALDACTONE) 25 MG tablet Take 1 tablet (25 mg total) by mouth 2 (two) times daily.  180 tablet  0    Assessment: Okay for Protocol Open abdominal wound No warfarin yet per MD note. CBC stable. No bleeding noted or reported.  Goal of Therapy:  Treament Dose   Plan:  Lovenox treatment dose.  Check CBC every 3 days. Follow up plans.   Caryl Asp 09/01/2011,8:22 AM

## 2011-09-02 LAB — CBC
HCT: 29.5 % — ABNORMAL LOW (ref 36.0–46.0)
Hemoglobin: 9.5 g/dL — ABNORMAL LOW (ref 12.0–15.0)
MCH: 28 pg (ref 26.0–34.0)
MCHC: 32.2 g/dL (ref 30.0–36.0)
RDW: 14.4 % (ref 11.5–15.5)

## 2011-09-02 LAB — BASIC METABOLIC PANEL
BUN: 14 mg/dL (ref 6–23)
Chloride: 102 mEq/L (ref 96–112)
Creatinine, Ser: 1.35 mg/dL — ABNORMAL HIGH (ref 0.50–1.10)
GFR calc Af Amer: 50 mL/min — ABNORMAL LOW (ref >60)
Glucose, Bld: 114 mg/dL — ABNORMAL HIGH (ref 70–99)
Potassium: 3.3 mEq/L — ABNORMAL LOW (ref 3.5–5.1)

## 2011-09-02 LAB — PROTIME-INR: Prothrombin Time: 14.4 seconds (ref 11.6–15.2)

## 2011-09-02 MED ORDER — DIAZEPAM 5 MG PO TABS
2.5000 mg | ORAL_TABLET | Freq: Three times a day (TID) | ORAL | Status: DC
  Administered 2011-09-02 (×2): 2.5 mg via ORAL
  Filled 2011-09-02 (×3): qty 1

## 2011-09-02 MED ORDER — FLUCONAZOLE 100 MG PO TABS
100.0000 mg | ORAL_TABLET | Freq: Every day | ORAL | Status: DC
  Administered 2011-09-02 – 2011-09-03 (×2): 100 mg via ORAL
  Filled 2011-09-02 (×2): qty 1

## 2011-09-02 MED ORDER — POTASSIUM CHLORIDE CRYS ER 20 MEQ PO TBCR
40.0000 meq | EXTENDED_RELEASE_TABLET | Freq: Once | ORAL | Status: AC
  Administered 2011-09-02: 40 meq via ORAL
  Filled 2011-09-02: qty 2

## 2011-09-02 MED ORDER — THERA M PLUS PO TABS
1.0000 | ORAL_TABLET | Freq: Every day | ORAL | Status: DC
  Administered 2011-09-03: 1 via ORAL
  Filled 2011-09-02: qty 1

## 2011-09-02 NOTE — Progress Notes (Signed)
ANTICOAGULATION CONSULT NOTE -    Pharmacy Consult for Lovenox Indication: pulmonary embolus  Patient Measurements: Height: 5\' 4"  (162.6 cm) Weight: 218 lb 0.6 oz (98.9 kg) IBW/kg (Calculated) : 54.7   Vital Signs: Temp: 98.4 F (36.9 C) (09/05 0502) Temp src: Oral (09/05 0502) BP: 99/65 mmHg (09/05 0502) Pulse Rate: 82  (09/05 0502)  Labs:  Basename 09/02/11 0550 08/31/11 0542  HGB 9.5* 9.7*  HCT 29.5* 30.0*  PLT 275 297  APTT -- --  LABPROT 14.4 --  INR 1.10 --  HEPARINUNFRC -- --  CREATININE 1.35* --  CRCLEARANCE -- --  CKTOTAL -- --  CKMB -- --  TROPONINI -- --   Medical History: Past Medical History  Diagnosis Date  . SVT (supraventricular tachycardia)   . Hypercholesterolemia   . Bilateral ovarian cysts   . Perforation bowel   . PE (pulmonary embolism)   . Thyroid disease   . GERD (gastroesophageal reflux disease)   . Cancer   . Sepsis   . E coli infection   . Acute renal failure   . Anxiety   . Pneumonia    Medications:  Prescriptions prior to admission  Medication Sig Dispense Refill  . ALPRAZolam (XANAX) 1 MG tablet Take 1 mg by mouth 4 (four) times daily.        . butalbital-acetaminophen-caffeine (FIORICET, ESGIC) 50-325-40 MG per tablet Take 1 tablet by mouth 2 (two) times daily as needed. For migraines       . cyclobenzaprine (FLEXERIL) 10 MG tablet Take 10 mg by mouth 3 (three) times daily as needed. For fibromyalgia        . HYDROcodone-acetaminophen (NORCO) 10-325 MG per tablet       . levothyroxine (SYNTHROID) 100 MCG tablet Take 1 tablet (100 mcg total) by mouth daily.  90 tablet  0  . omeprazole (PRILOSEC) 40 MG capsule Take 1 capsule (40 mg total) by mouth daily.  90 capsule  1  . ondansetron (ZOFRAN) 4 MG tablet Take 4 mg by mouth every 8 (eight) hours as needed. For nausea        . pravastatin (PRAVACHOL) 80 MG tablet Take 1 tablet (80 mg total) by mouth every evening. Dose increase  90 tablet  0  . ibuprofen (ADVIL,MOTRIN) 800 MG  tablet       . spironolactone (ALDACTONE) 25 MG tablet Take 1 tablet (25 mg total) by mouth 2 (two) times daily.  180 tablet  0   Assessment: Okay for Protocol Open abdominal wound No warfarin yet per MD note. CBC stable. No bleeding noted or reported.  Goal of Therapy:  Treament Dose   Plan:  Lovenox treatment dose.  Check CBC every 3 days. Follow up plans.   Margo Aye, Rickell Wiehe A 09/02/2011,9:27 AM

## 2011-09-02 NOTE — Progress Notes (Signed)
Subjective: [name] continues to experience back discomfort, but has had no dyspnea. The pleuritic nature of her symptoms has decreased as she exercises with an incentive spirometer. She complains of dysuria, but urinalysis and culture on admission were benign.  Radiographic studies were reviewed with Dr. Jena Gauss. Chest x-ray shows bibasilar atelectasis and pulmonary density consistent with scarring at the left heart border.  This is in the region demonstrating a perfusion defect on the VQ scan. The supradiaphragmatic cuts on the CT scan of the abdomen did not provide much additional information.  Objective: Vital signs in last 24 hours: Temp:  [98 F (36.7 C)-98.4 F (36.9 C)] 98.4 F (36.9 C) (09/05 0502) Pulse Rate:  [82-98] 82  (09/05 0502) Resp:  [18-19] 18  (09/05 0502) BP: (99-111)/(65-73) 99/65 mmHg (09/05 0502) SpO2:  [93 %-95 %] 95 % (09/05 0502) Weight change:  Last BM Date: 09/02/11  General-Well developed; no acute distress Neck-No JVD, no carotid bruits Lungs: Modest bibasilar rales; normal I:E ratio Cardiovascular-normal PMI; normal S1 and S2; grade 1/6 systolic ejection murmur at the cardiac base Abdomen-normal bowel sounds; semiliquid stool in collection bag over the anterior abdomen Skin-Warm, no significant lesions Extremities-Nl distal pulses; no edema  Lab Results:  Basename 09/02/11 0550 08/31/11 0542  WBC 4.9 5.5  HGB 9.5* 9.7*  HCT 29.5* 30.0*  PLT 275 297   BMET  Basename 09/02/11 0550  NA 136  K 3.3*  CL 102  CO2 26  GLUCOSE 114*  BUN 14  CREATININE 1.35*  CALCIUM 9.1   D-dimer-0.42  Assessment/Plan: The likelihood of pulmonary embolism appears to be low in the absence of thrombosis of the lower extremity veins, convincing symptoms, hypoxemia and with a normal d-dimer. I recommend decreasing heparin or Lovenox dosage Q8 DVT prophylaxis level. Additional imaging studies are not necessary although Dr. Jena Gauss and I agree that a CT scan with  100 cc of contrast could be done with reasonable safety. If there are no other medical issues requiring hospitalization, [name] appears ready to be discharged shortly.   LOS: 4 days   [name] 09/02/2011, 5:15 PM

## 2011-09-02 NOTE — Progress Notes (Signed)
Subjective: She continues to complain of left mid back pain. She denies dyspnea, chest pain or hemoptysis.           Physical Exam: Blood pressure 99/65, pulse 82, temperature 98.4 F (36.9 C), temperature source Oral, resp. rate 18, height 5\' 4"  (1.626 m), weight 98.9 kg (218 lb 0.6 oz), SpO2 95.00%. She looks systemically well. Heart sounds are present and normal lung fields are clear. There is left mid back  tenderness reproducing her pain and it appears to be muscle spasm.   Investigations: Results for orders placed during the hospital encounter of 08/29/11 (from the past 48 hour(s))  D-DIMER, QUANTITATIVE     Status: Normal   Collection Time   09/01/11  8:41 PM      Component Value Range Comment   D-Dimer, Quant 0.42  0.00 - 0.48 (ug/mL-FEU)   PROTIME-INR     Status: Normal   Collection Time   09/02/11  5:50 AM      Component Value Range Comment   Prothrombin Time 14.4  11.6 - 15.2 (seconds)    INR 1.10  0.00 - 1.49    BASIC METABOLIC PANEL     Status: Abnormal   Collection Time   09/02/11  5:50 AM      Component Value Range Comment   Sodium 136  135 - 145 (mEq/L)    Potassium 3.3 (*) 3.5 - 5.1 (mEq/L)    Chloride 102  96 - 112 (mEq/L)    CO2 26  19 - 32 (mEq/L)    Glucose, Bld 114 (*) 70 - 99 (mg/dL)    BUN 14  6 - 23 (mg/dL)    Creatinine, Ser 1.61 (*) 0.50 - 1.10 (mg/dL)    Calcium 9.1  8.4 - 10.5 (mg/dL)    GFR calc non Af Amer 42 (*) >60 (mL/min)    GFR calc Af Amer 50 (*) >60 (mL/min)   CBC     Status: Abnormal   Collection Time   09/02/11  5:50 AM      Component Value Range Comment   WBC 4.9  4.0 - 10.5 (K/uL)    RBC 3.39 (*) 3.87 - 5.11 (MIL/uL)    Hemoglobin 9.5 (*) 12.0 - 15.0 (g/dL)    HCT 09.6 (*) 04.5 - 46.0 (%)    MCV 87.0  78.0 - 100.0 (fL)    MCH 28.0  26.0 - 34.0 (pg)    MCHC 32.2  30.0 - 36.0 (g/dL)    RDW 40.9  81.1 - 91.4 (%)    Platelets 275  150 - 400 (K/uL)    Recent Results (from the past 240 hour(s))  URINE CULTURE     Status: Normal     Collection Time   08/29/11  8:27 PM      Component Value Range Status Comment   Specimen Description URINE, CATHETERIZED   Final    Special Requests NONE   Final    Setup Time [id number]   Final    Colony Count NO GROWTH   Final    Culture NO GROWTH   Final    Report Status 09/01/2011 FINAL   Final   MRSA PCR SCREENING     Status: Normal   Collection Time   08/29/11 11:08 PM      Component Value Range Status Comment   MRSA by PCR NEGATIVE  NEGATIVE  Final   CLOSTRIDIUM DIFFICILE BY PCR     Status: Normal   Collection Time  08/29/11 11:09 PM      Component Value Range Status Comment   C difficile by pcr NEGATIVE  NEGATIVE  Final     No results found.    Medications: I have reviewed the patient's current medications.  Impression: 1. Possible pulmonary embolism. 2. Mid back muscle spasm. 3. Complicated abdominal pathology with a history of bowel perforation for left ovarian tumor surgery. This was complicated by abdominal wound dehiscence  and sepsis.     Plan: 1. Trial of diazepam 2.5 mg 3 times a day for muscle spasm. 2. Cardiology input would be useful regarding anticoagulation.     LOS: 4 days   Jorma Tassinari C 09/02/2011, 2:03 PM

## 2011-09-02 NOTE — Progress Notes (Signed)
Subjective: She says she feels a little bit better and has less chest pain today. She had echocardiogram done that was essentially normal. She had a d-dimer yesterday that was normal.  Objective: Vital signs in last 24 hours: Temp:  [98 F (36.7 C)-98.5 F (36.9 C)] 98.4 F (36.9 C) (09/05 0502) Pulse Rate:  [82-98] 82  (09/05 0502) Resp:  [18-20] 18  (09/05 0502) BP: (99-111)/(65-73) 99/65 mmHg (09/05 0502) SpO2:  [93 %-95 %] 95 % (09/05 0502) Weight change:  Last BM Date: 09/01/11  Intake/Output from previous day: 09/04 0701 - 09/05 0700 In: 1390 [P.O.:1390] Out: 1200 [Urine:1000; Stool:200]  PHYSICAL EXAM General appearance: alert, cooperative and no distress Resp: clear to auscultation bilaterally Cardio: regular rate and rhythm, S1, S2 normal, no murmur, click, rub or gallop GI: She has the wound is healing Extremities: extremities normal, atraumatic, no cyanosis or edema  Lab Results:    Basic Metabolic Panel:  Basename 09/02/11 0550  NA 136  K 3.3*  CL 102  CO2 26  GLUCOSE 114*  BUN 14  CREATININE 1.35*  CALCIUM 9.1  MG --  PHOS --   Liver Function Tests: No results found for this basename: AST:2,ALT:2,ALKPHOS:2,BILITOT:2,PROT:2,ALBUMIN:2 in the last 72 hours No results found for this basename: LIPASE:2,AMYLASE:2 in the last 72 hours No results found for this basename: AMMONIA:2 in the last 72 hours CBC:  Basename 09/02/11 0550 08/31/11 0542  WBC 4.9 5.5  NEUTROABS -- --  HGB 9.5* 9.7*  HCT 29.5* 30.0*  MCV 87.0 88.5  PLT 275 297   Cardiac Enzymes: No results found for this basename: CKTOTAL:3,CKMB:3,CKMBINDEX:3,TROPONINI:3 in the last 72 hours BNP: No results found for this basename: POCBNP:3 in the last 72 hours D-Dimer:  Alvira Philips 09/01/11 2041  DDIMER 0.42   CBG: No results found for this basename: GLUCAP:6 in the last 72 hours Hemoglobin A1C: No results found for this basename: HGBA1C in the last 72 hours Fasting Lipid Panel: No  results found for this basename: CHOL,HDL,LDLCALC,TRIG,CHOLHDL,LDLDIRECT in the last 72 hours Thyroid Function Tests: No results found for this basename: TSH,T4TOTAL,FREET4,T3FREE,THYROIDAB in the last 72 hours Anemia Panel: No results found for this basename: VITAMINB12,FOLATE,FERRITIN,TIBC,IRON,RETICCTPCT in the last 72 hours Urine Drug Screen:  Alcohol Level: No results found for this basename: ETH:2 in the last 72 hours Urinalysis:  Misc. Labs:  ABGS No results found for this basename: PHART,PCO2,PO2ART,TCO2,HCO3 in the last 72 hours CULTURES Recent Results (from the past 240 hour(s))  URINE CULTURE     Status: Normal   Collection Time   08/29/11  8:27 PM      Component Value Range Status Comment   Specimen Description URINE, CATHETERIZED   Final    Special Requests NONE   Final    Setup Time [id number]   Final    Colony Count NO GROWTH   Final    Culture NO GROWTH   Final    Report Status 09/01/2011 FINAL   Final   MRSA PCR SCREENING     Status: Normal   Collection Time   08/29/11 11:08 PM      Component Value Range Status Comment   MRSA by PCR NEGATIVE  NEGATIVE  Final   CLOSTRIDIUM DIFFICILE BY PCR     Status: Normal   Collection Time   08/29/11 11:09 PM      Component Value Range Status Comment   C difficile by pcr NEGATIVE  NEGATIVE  Final    Studies/Results: No results found.  Medications:  Scheduled:   .  diphenhydrAMINE  25 mg Oral Once  . enoxaparin (LOVENOX) injection  1 mg/kg Subcutaneous Q12H  . levothyroxine  100 mcg Oral Daily  . multivitamin  5 mL Oral Daily  . pantoprazole  40 mg Oral Q1200  . simvastatin  40 mg Oral q1800   Continuous:  ZOX:[id number], acetaminophen, albuterol, ALPRAZolam, cyclobenzaprine, HYDROcodone-acetaminophen, HYDROmorphone (DILAUDID) injection, ondansetron (ZOFRAN) IV, ondansetron  Assesment: She has what appears to be a pulmonary embolus at least by ventilation/perfusion scan criteria. She has moderate renal  insufficiency. Her d-dimer was normal. I will discuss with Dr. Ardyth Harps and Dr. Dietrich Pates who has apparently seen her but is noted not available yet. I think we may need to go ahead with CT chest with contrast and try to avoid any further insult to her kidneys. Principal Problem:  *Pulmonary embolism Active Problems:  Unspecified hypothyroidism  ANEMIA, IRON DEFICIENCY, CHRONIC  ANXIETY, CHRONIC  Abdominal wound dehiscence    Plan: I did not order the CT but will discuss with other physicians involved.    LOS: 4 days   Keiji Melland L 09/02/2011, 8:48 AM

## 2011-09-02 NOTE — Progress Notes (Signed)
Physical Therapy Treatment Patient Name: [name] Date: 09/02/2011 TIME: 0454-0981 CHARGES: 1 TE 1 GT Problem List:  Patient Active Problem List  Diagnoses  . ADENOCARCINOMA, BREAST, LEFT  . Unspecified hypothyroidism  . HYPERLIPIDEMIA  . OBESITY  . ANEMIA, IRON DEFICIENCY, CHRONIC  . ANXIETY, CHRONIC  . DEPRESSION  . CARPAL TUNNEL SYNDROME, RIGHT  . ACUTE CYSTITIS  . OVARIAN CYST  . NECK PAIN, CHRONIC  . BACK PAIN, CHRONIC  . FIBROMYALGIA  . Other Malaise and Fatigue  . UNSPECIFIED TACHYCARDIA  . SUPRAVENTRICULAR TACHYCARDIA, HX OF  . SMALL BOWEL OBSTRUCTION, HX OF  . INTERSTITIAL CYSTITIS  . DISORDER OF BONE AND CARTILAGE UNSPECIFIED  . IMPAIRED FASTING GLUCOSE  . Fistula  . Acute renal insufficiency  . Pulmonary embolism  . Abdominal wound dehiscence   Past Medical History:  Past Medical History  Diagnosis Date  . SVT (supraventricular tachycardia)   . Hypercholesterolemia   . Bilateral ovarian cysts   . Perforation bowel   . PE (pulmonary embolism)   . Thyroid disease   . GERD (gastroesophageal reflux disease)   . Cancer   . Sepsis   . E coli infection   . Acute renal failure   . Anxiety   . Pneumonia    Past Surgical History:  Past Surgical History  Procedure Date  . Ablasion   . Vena cava filter placement   . Abdominal hysterectomy   . Hernia repair   . Colon surgery   . Bowel resection    Precautions/Restrictions  Restrictions Weight Bearing Restrictions: No Mobility (including Balance) Ambulation/Gait Ambulation/Gait Assistance: 5: Supervision Ambulation Distance (Feet): 140 Feet Assistive device: 1 person hand held assist Gait velocity: slow Stairs: No Wheelchair Mobility Wheelchair Mobility: No    Exercise  Total Joint Exercises Ankle Circles/Pumps: Both;20 reps Long Arc Quad: Both;20 reps General Exercises - Lower Extremity Ankle Circles/Pumps: Both;20 reps Long Arc Quad: Both;20 reps Toe Raises: Both;20  reps Heel Raises: Both (20 reps) Low Level/ICU Exercises Ankle Circles/Pumps: Both;20 reps  End of Session PT - End of Session Equipment Utilized During Treatment: Gait belt Activity Tolerance: Patient tolerated treatment well Patient left: in chair;with call bell in reach General Behavior During Session: [facility] for tasks performed Cognition: [facility] for tasks performed PT Assessment/Plan  PT - Assessment/Plan Comments on Treatment Session: Pt ambulated same distance today  but with slightly increase speed and steadiness from yesterday; NO LOB PT Goals  Acute Rehab PT Goals PT Goal: Ambulate - Progress: Progressing toward goal  [name] 09/02/2011, 12:21 PM

## 2011-09-03 LAB — CBC
MCH: 28 pg (ref 26.0–34.0)
MCHC: 32.1 g/dL (ref 30.0–36.0)
MCV: 87.2 fL (ref 78.0–100.0)
Platelets: 270 10*3/uL (ref 150–400)
RBC: 3.36 MIL/uL — ABNORMAL LOW (ref 3.87–5.11)
RDW: 14.6 % (ref 11.5–15.5)

## 2011-09-03 LAB — COMPREHENSIVE METABOLIC PANEL
ALT: 11 U/L (ref 0–35)
AST: 15 U/L (ref 0–37)
Albumin: 2.8 g/dL — ABNORMAL LOW (ref 3.5–5.2)
Calcium: 9.1 mg/dL (ref 8.4–10.5)
Creatinine, Ser: 1.05 mg/dL (ref 0.50–1.10)
GFR calc non Af Amer: 56 mL/min — ABNORMAL LOW (ref >60)
Sodium: 137 mEq/L (ref 135–145)
Total Protein: 6.2 g/dL (ref 6.0–8.3)

## 2011-09-03 MED ORDER — DIAZEPAM 5 MG PO TABS: 2.5000 mg | ORAL_TABLET | Freq: Three times a day (TID) | ORAL | Status: AC

## 2011-09-03 MED ORDER — ENOXAPARIN SODIUM 40 MG/0.4ML ~~LOC~~ SOLN: 40.0000 mg | SUBCUTANEOUS | Status: DC

## 2011-09-03 MED ORDER — FLUCONAZOLE 100 MG PO TABS: 100.0000 mg | ORAL_TABLET | Freq: Every day | ORAL | Status: AC

## 2011-09-03 NOTE — Discharge Summary (Signed)
Physician Discharge Summary  Patient ID: [name] MRN: [id number] DOB/AGE: [birth year] 50 y.o. Primary Care Physician:Margaret Lodema Hong, MD, MD Admit date: 08/29/2011 Discharge date: 09/03/2011    Discharge Diagnoses:  1. Low back pain, likely muscular spasm. VQ scan and leg venous Doppler unconvincing for pulmonary embolism. 2. Left ovarian tumor, status post bowel perforation and abdominal wound dehiscent requiring complicated surgery. 3. Anemia of chronic disease.   Current Discharge Medication List    START taking these medications   Details  diazepam (VALIUM) 5 MG tablet Take 0.5 tablets (2.5 mg total) by mouth 3 (three) times daily. Qty: 30 tablet, Refills: 0    fluconazole (DIFLUCAN) 100 MG tablet Take 1 tablet (100 mg total) by mouth daily. Qty: 5 tablet, Refills: 0      CONTINUE these medications which have NOT CHANGED   Details  ALPRAZolam (XANAX) 1 MG tablet Take 1 mg by mouth 4 (four) times daily.      butalbital-acetaminophen-caffeine (FIORICET, ESGIC) 50-325-40 MG per tablet Take 1 tablet by mouth 2 (two) times daily as needed. For migraines     cyclobenzaprine (FLEXERIL) 10 MG tablet Take 10 mg by mouth 3 (three) times daily as needed. For fibromyalgia      HYDROcodone-acetaminophen (NORCO) 10-325 MG per tablet     levothyroxine (SYNTHROID) 100 MCG tablet Take 1 tablet (100 mcg total) by mouth daily. Qty: 90 tablet, Refills: 0    omeprazole (PRILOSEC) 40 MG capsule Take 1 capsule (40 mg total) by mouth daily. Qty: 90 capsule, Refills: 1    ondansetron (ZOFRAN) 4 MG tablet Take 4 mg by mouth every 8 (eight) hours as needed. For nausea      pravastatin (PRAVACHOL) 80 MG tablet Take 1 tablet (80 mg total) by mouth every evening. Dose increase Qty: 90 tablet, Refills: 0    spironolactone (ALDACTONE) 25 MG tablet Take 1 tablet (25 mg total) by mouth 2 (two) times daily. Qty: 180 tablet, Refills: 0      STOP taking these medications     ibuprofen  (ADVIL,MOTRIN) 800 MG tablet         Discharged Condition: Stable.    Consults: Pulmonology, Dr. Juanetta Gosling.                   Cardiology, Dr. Dietrich Pates.  Significant Diagnostic Studies: Ct Abdomen Pelvis Wo Contrast  08/29/2011  *RADIOLOGY REPORT*  Clinical Data: Left flank pain.  Question stone.  CT ABDOMEN AND PELVIS WITHOUT CONTRAST  Technique:  Multidetector CT imaging of the abdomen and pelvis was performed following the standard protocol without intravenous contrast.  Comparison: Abdomen pelvis CT 12/08/2009  Findings: The imaged portion of the lower chest demonstrates left mastectomy with TRAM reconstruction and a right breast implant, partially visualized.  There is atelectasis and/or consolidation in both lower lobes, left greater than right, and there are linear bands of atelectasis or scarring in the right middle lobe and lingula.  There is an open anterior abdominal incision, with some packing/bandage material seen within it.  No enhancing fluid collection is seen to suggest an abscess at the site of this open wound.  There is surgical suture associated with several loops of distal  ileum.   There may be some bowel wall thickening of the pelvic bowel loops which contain surgical suture (image number 65). These distal small bowel loops may be tethered anteriorly There appears to be a an anastomosis of small bowel with sigmoid colon on image number 71. There is no evidence  of bowel obstruction.  It appears that the majority of the colon has been resected, with residual sigmoid colon and rectum present. Urinary bladder not very distended.  Hysterectomy.  Previously described ovarian masses on CT of December 2010 have been removed, there are surgical clips in the left adnexa.  No intraabdominal or intrapelvic abscess is seen.  The noncontrast appearance of the liver, spleen, adrenal glands, pancreas, and kidneys are within normal limits.  Specifically, there are no renal calculi and there is no  hydronephrosis.  Both ureters are normal in caliber.  No ureteral stone is seen.  Abdominal aorta is normal in caliber.  IVC filter is present at the level of L1-L2.  There is no lymphadenopathy.  No acute bony abnormality peri  IMPRESSION: . 1.  Negative for urinary tract stones or obstruction. 2.  Large open midline abdominal surgical wound with packing/bandage material.  No evidence of abdominal wall abscess. 3.  Postsurgical changes of the bowel with suspected enterocolonic anastomosis at the level of the sigmoid colon with apparent surgical resection of the the remainder of the colon.  Some of the distal small bowel loops demonstrate some mild wall thickening and may be partially tethered to the anterior abdominal wall.  Findings may reflect recent postoperative changes, inflammation, or adhesion formation.  There is no evidence of bowel obstruction.  4.  Bilateral lower lobe atelectasis and/or consolidation.  Original Report Authenticated By: Britta Mccreedy, M.D.   Nm Pulmonary Perfusion  08/29/2011  *RADIOLOGY REPORT*  Clinical Data:  50 year old female with chest pain and shortness of breath.  NUCLEAR MEDICINE VENTILATION - PERFUSION LUNG SCAN  Technique:  Wash-in, equilibrium, and wash-out phase ventilation images were obtained using Xe-133 gas.  Perfusion images were obtained in multiple projections after intravenous injection of Tc- 22m MAA.  Radiopharmaceuticals:  13.5 mCi Xe-133 gas and 5.0 mCi Tc-26m MAA.  Comparison:  08/29/2011 chest radiograph.  Findings: There is a perfusion defect involving the entire lingula. There is decreased ventilation in this area. Opacity within the lingula on recent chest radiograph is also noted. No other perfusion or ventilation abnormalities are present.  IMPRESSION: Triple matched defect of the lingula - intermediate probability for pulmonary embolus  (20-79%).  Original Report Authenticated By: Rosendo Gros, M.D.   US Venous Img Lower Bilateral  08/30/2011   *RADIOLOGY REPORT*  Clinical Data: Lower extremity swelling.  History of right upper extremity DVT.  Headache.  Pulmonary embolism.  VENOUS DUPLEX ULTRASOUND OF BILATERAL LOWER EXTREMITIES  Technique:  Gray-scale sonography with graded compression, as well as color Doppler and duplex ultrasound, were performed to evaluate the deep venous system of both lower extremities from the level of the common femoral vein through the popliteal and proximal calf veins.  Spectral Doppler was utilized to evaluate flow at rest and with distal augmentation maneuvers.  Comparison:  None.  Findings: Normal flow, compressibility, augmentation, within the distal common femoral, proximal greater saphenous/profunda femoral, entire femoral, popliteal, and imaged calf veins.  IMPRESSION: No evidence of lower extremity deep venous thrombosis bilaterally.  Original Report Authenticated By: Consuello Bossier, M.D.   Dg Chest Portable 1 View  08/29/2011  *RADIOLOGY REPORT*  Clinical Data: Shortness of breath and chest pain.  Recent surgery.  PORTABLE CHEST - 1 VIEW  Comparison: 08/11/2009  Findings: The cardiopericardial silhouette is within normal limits. Increased lingular opacity is identified - question atelectasis/collapse or consolidation. Mild airspace disease/atelectasis in the right lower lung is noted. There is no evidence of pleural effusion  or pneumothorax.  IMPRESSION: Lingular atelectasis versus consolidation.  Mild airspace disease/atelectasis in the right lower lung.  Original Report Authenticated By: Rosendo Gros, M.D.    Lab Results: Results for orders placed during the hospital encounter of 08/29/11 (from the past 48 hour(s))  D-DIMER, QUANTITATIVE     Status: Normal   Collection Time   09/01/11  8:41 PM      Component Value Range Comment   D-Dimer, Quant 0.42  0.00 - 0.48 (ug/mL-FEU)   PROTIME-INR     Status: Normal   Collection Time   09/02/11  5:50 AM      Component Value Range Comment   Prothrombin Time 14.4   11.6 - 15.2 (seconds)    INR 1.10  0.00 - 1.49    BASIC METABOLIC PANEL     Status: Abnormal   Collection Time   09/02/11  5:50 AM      Component Value Range Comment   Sodium 136  135 - 145 (mEq/L)    Potassium 3.3 (*) 3.5 - 5.1 (mEq/L)    Chloride 102  96 - 112 (mEq/L)    CO2 26  19 - 32 (mEq/L)    Glucose, Bld 114 (*) 70 - 99 (mg/dL)    BUN 14  6 - 23 (mg/dL)    Creatinine, Ser 4.09 (*) 0.50 - 1.10 (mg/dL)    Calcium 9.1  8.4 - 10.5 (mg/dL)    GFR calc non Af Amer 42 (*) >60 (mL/min)    GFR calc Af Amer 50 (*) >60 (mL/min)   CBC     Status: Abnormal   Collection Time   09/02/11  5:50 AM      Component Value Range Comment   WBC 4.9  4.0 - 10.5 (K/uL)    RBC 3.39 (*) 3.87 - 5.11 (MIL/uL)    Hemoglobin 9.5 (*) 12.0 - 15.0 (g/dL)    HCT 81.1 (*) 91.4 - 46.0 (%)    MCV 87.0  78.0 - 100.0 (fL)    MCH 28.0  26.0 - 34.0 (pg)    MCHC 32.2  30.0 - 36.0 (g/dL)    RDW 78.2  95.6 - 21.3 (%)    Platelets 275  150 - 400 (K/uL)   CBC     Status: Abnormal   Collection Time   09/03/11  4:27 AM      Component Value Range Comment   WBC 5.0  4.0 - 10.5 (K/uL)    RBC 3.36 (*) 3.87 - 5.11 (MIL/uL)    Hemoglobin 9.4 (*) 12.0 - 15.0 (g/dL)    HCT 08.6 (*) 57.8 - 46.0 (%)    MCV 87.2  78.0 - 100.0 (fL)    MCH 28.0  26.0 - 34.0 (pg)    MCHC 32.1  30.0 - 36.0 (g/dL)    RDW 46.9  62.9 - 52.8 (%)    Platelets 270  150 - 400 (K/uL)   COMPREHENSIVE METABOLIC PANEL     Status: Abnormal   Collection Time   09/03/11  4:27 AM      Component Value Range Comment   Sodium 137  135 - 145 (mEq/L)    Potassium 3.9  3.5 - 5.1 (mEq/L)    Chloride 101  96 - 112 (mEq/L)    CO2 24  19 - 32 (mEq/L)    Glucose, Bld 110 (*) 70 - 99 (mg/dL)    BUN 14  6 - 23 (mg/dL)    Creatinine, Ser 4.13  0.50 -  1.10 (mg/dL)    Calcium 9.1  8.4 - 10.5 (mg/dL)    Total Protein 6.2  6.0 - 8.3 (g/dL)    Albumin 2.8 (*) 3.5 - 5.2 (g/dL)    AST 15  0 - 37 (U/L)    ALT 11  0 - 35 (U/L)    Alkaline Phosphatase 113  39 - 117 (U/L)     Total Bilirubin 0.2 (*) 0.3 - 1.2 (mg/dL)    GFR calc non Af Amer 56 (*) >60 (mL/min)    GFR calc Af Amer >60  >60 (mL/min)    Recent Results (from the past 240 hour(s))  URINE CULTURE     Status: Normal   Collection Time   08/29/11  8:27 PM      Component Value Range Status Comment   Specimen Description URINE, CATHETERIZED   Final    Special Requests NONE   Final    Setup Time [id number]   Final    Colony Count NO GROWTH   Final    Culture NO GROWTH   Final    Report Status 09/01/2011 FINAL   Final   MRSA PCR SCREENING     Status: Normal   Collection Time   08/29/11 11:08 PM      Component Value Range Status Comment   MRSA by PCR NEGATIVE  NEGATIVE  Final   CLOSTRIDIUM DIFFICILE BY PCR     Status: Normal   Collection Time   08/29/11 11:09 PM      Component Value Range Status Comment   C difficile by pcr NEGATIVE  NEGATIVE  Final      Hospital Course: This unfortunate 50 year old lady was admitted with lower back pain which was sharp in nature associated with dyspnea. 3 see initial history and physical examination done by Dr. Kathreen Devoid. It was felt that she may have a pulmonary embolism. She underwent a VQ scan which was of intermediate probability. Venous leg Dopplers were negative for DVT. Dr. Dietrich Pates, cardiology, reviewed this patient and discussed further with radiology regarding the VQ scan. It was felt that overall, it was unlikely that she did have a pulmonary embolism. Therefore the decision has been made for no anticoagulation at this point. The patient, in any case, was reluctant to pursue anticoagulation if required. Unfortunately, she still continues to have lower back pain. I have tried her on diazepam small dose 2.5 mg 3 times a day to see if this will achieve some relief with reduction of spasm. I've told her that I will give her enough medication for approximately 10 days and that this medication should not be used on a long-term basis. She agrees with this. She is also  not keen to be on long-term opioids for pain. She has no further new symptoms today.  Discharge Exam: Blood pressure 121/70, pulse 87, temperature 98.9 F (37.2 C), temperature source Oral, resp. rate 20, height 5\' 4"  (1.626 m), weight 98.9 kg (218 lb 0.6 oz), SpO2 97.00%. She does look systemically well. She has not dyspneic at rest and there is no increased work of breathing. Heart sounds are present and normal. Lung fields are clear. There is no pleural rub.. She is alert and orientated without any focal neurological signs.  Disposition: Home.  Discharge Orders    Future Appointments: Provider: Department: Dept Phone: Center:   09/23/2011 8:00 AM Syliva Overman, MD [facility] [phone] [facility]     Future Orders Please Complete By Expires   Diet - low  sodium heart healthy      Increase activity slowly           Signed: GOSRANI,NIMISH C 09/03/2011, 9:08 AM

## 2011-09-03 NOTE — Progress Notes (Signed)
Pt d/c home today. Notified linda of [facility] to resume previous hh orders with wound care. Spoke to pt no other d/c needs

## 2011-09-03 NOTE — Progress Notes (Signed)
I discussed her situation with the hospitalist attending and apparently she is to be discharged today and I plan to sign off at this point.  Thank you very much for allow me to see her with you.

## 2011-09-13 NOTE — Assessment & Plan Note (Signed)
Improved. Pt has lost a lot of weight due to recent illness, unfortunately

## 2011-09-13 NOTE — Assessment & Plan Note (Signed)
Pt to be maintained on lipid lowering agent and labs followed

## 2011-09-13 NOTE — Assessment & Plan Note (Signed)
Recently removed with multiple complications

## 2011-09-13 NOTE — Assessment & Plan Note (Signed)
Repeat chem 7 obtained, and referral made to nephrology at [facility]

## 2011-09-13 NOTE — Assessment & Plan Note (Signed)
rept tsh shows undercorrection , likely due to decreased absorption, will adjust dose and review

## 2011-09-14 ENCOUNTER — Telehealth: Payer: Self-pay | Admitting: Family Medicine

## 2011-09-14 NOTE — Telephone Encounter (Signed)
pls write a note stating pt under my care and due to current medical conditions request she be excused, thank you, I will sign

## 2011-09-16 NOTE — Telephone Encounter (Signed)
LETTER HAS BEEN TYPED AND PROVIDED TO DOCTOR FOR SIGNATURE

## 2011-09-22 ENCOUNTER — Encounter: Payer: Self-pay | Admitting: Family Medicine

## 2011-09-23 ENCOUNTER — Encounter: Payer: Self-pay | Admitting: Family Medicine

## 2011-09-23 ENCOUNTER — Telehealth: Payer: Self-pay | Admitting: Family Medicine

## 2011-09-23 ENCOUNTER — Ambulatory Visit (INDEPENDENT_AMBULATORY_CARE_PROVIDER_SITE_OTHER): Payer: Medicare Other | Admitting: Family Medicine

## 2011-09-23 ENCOUNTER — Other Ambulatory Visit: Payer: Self-pay | Admitting: Family Medicine

## 2011-09-23 VITALS — BP 102/80 | HR 105 | Resp 16 | Ht 64.0 in | Wt 210.0 lb

## 2011-09-23 DIAGNOSIS — E039 Hypothyroidism, unspecified: Secondary | ICD-10-CM

## 2011-09-23 DIAGNOSIS — R5383 Other fatigue: Secondary | ICD-10-CM

## 2011-09-23 DIAGNOSIS — N289 Disorder of kidney and ureter, unspecified: Secondary | ICD-10-CM

## 2011-09-23 DIAGNOSIS — R5381 Other malaise: Secondary | ICD-10-CM

## 2011-09-23 DIAGNOSIS — K219 Gastro-esophageal reflux disease without esophagitis: Secondary | ICD-10-CM

## 2011-09-23 DIAGNOSIS — F411 Generalized anxiety disorder: Secondary | ICD-10-CM

## 2011-09-23 DIAGNOSIS — E669 Obesity, unspecified: Secondary | ICD-10-CM

## 2011-09-23 DIAGNOSIS — N3000 Acute cystitis without hematuria: Secondary | ICD-10-CM

## 2011-09-23 DIAGNOSIS — N3010 Interstitial cystitis (chronic) without hematuria: Secondary | ICD-10-CM

## 2011-09-23 DIAGNOSIS — D649 Anemia, unspecified: Secondary | ICD-10-CM

## 2011-09-23 DIAGNOSIS — Z13820 Encounter for screening for osteoporosis: Secondary | ICD-10-CM

## 2011-09-23 DIAGNOSIS — Z23 Encounter for immunization: Secondary | ICD-10-CM

## 2011-09-23 DIAGNOSIS — D509 Iron deficiency anemia, unspecified: Secondary | ICD-10-CM

## 2011-09-23 DIAGNOSIS — E785 Hyperlipidemia, unspecified: Secondary | ICD-10-CM

## 2011-09-23 DIAGNOSIS — F419 Anxiety disorder, unspecified: Secondary | ICD-10-CM

## 2011-09-23 DIAGNOSIS — N760 Acute vaginitis: Secondary | ICD-10-CM

## 2011-09-23 LAB — CBC WITH DIFFERENTIAL/PLATELET
Eosinophils Absolute: 0.3 10*3/uL (ref 0.0–0.7)
HCT: 35.7 % — ABNORMAL LOW (ref 36.0–46.0)
Hemoglobin: 11.5 g/dL — ABNORMAL LOW (ref 12.0–15.0)
Lymphs Abs: 2.6 10*3/uL (ref 0.7–4.0)
MCH: 27.6 pg (ref 26.0–34.0)
MCHC: 32.2 g/dL (ref 30.0–36.0)
MCV: 85.8 fL (ref 78.0–100.0)
Monocytes Absolute: 0.4 10*3/uL (ref 0.1–1.0)
Monocytes Relative: 6 % (ref 3–12)
Neutrophils Relative %: 52 % (ref 43–77)
RBC: 4.16 MIL/uL (ref 3.87–5.11)

## 2011-09-23 LAB — BASIC METABOLIC PANEL
BUN: 12 mg/dL (ref 6–23)
Chloride: 102 mEq/L (ref 96–112)
Creat: 1.09 mg/dL (ref 0.50–1.10)
Glucose, Bld: 105 mg/dL — ABNORMAL HIGH (ref 70–99)
Potassium: 4.3 mEq/L (ref 3.5–5.3)

## 2011-09-23 LAB — ANEMIA PANEL
Iron: 45 ug/dL (ref 42–145)
Retic Ct Pct: 2 % (ref 0.4–2.3)
TIBC: 310 ug/dL (ref 250–470)
UIBC: 265 ug/dL (ref 125–400)
Vitamin B-12: 505 pg/mL (ref 211–911)

## 2011-09-23 LAB — HEPATIC FUNCTION PANEL
ALT: 12 U/L (ref 0–35)
AST: 16 U/L (ref 0–37)
Albumin: 4.5 g/dL (ref 3.5–5.2)

## 2011-09-23 MED ORDER — INFLUENZA VAC TYPES A & B PF IM SUSP: 0.5000 mL | Freq: Once | INTRAMUSCULAR | Status: DC

## 2011-09-23 NOTE — Assessment & Plan Note (Signed)
Under corrected at last check I believe due to malabsorption problems, will rept lab today and decide if endocvrine involvement warranted at this time

## 2011-09-23 NOTE — Progress Notes (Signed)
  Subjective:    Patient ID: [name], female    DOB: 1961-11-30, 50 y.o.   MRN: [id number]  HPI Pt is slowly making progress with healing of her entero atmospheric wound. Her care for this is now at [facility], she has seen the Doc once , and her daughter sends pictures to [facility] every 3 days when she changes the bags for f/u. She has an appt 2 weeks after the initial for f/u . No h/o fever, chills, erythema or purulent drainage, and residuals are lessening  . Has renal appt upcoming at [facility] also, since she was told she had acute renal failure , which may have been iatrogenic at [facility], renal f/u is being pursued. Last BUN and creatinine were normal.  C/o excessive hot flashes, day and night which are a nightmare Thyroid is not controlled based on most recent labs, and she has extremely low iron with poor absorption due to her GI situation, help from hematology and endocrine will be sought C/o burning with urination, has interstitial cystitis, but staes her sym[ptoms   Review of Systems See HPI Denies recent fever or chills. Denies sinus pressure, nasal congestion, ear pain or sore throat. Denies chest congestion, productive cough or wheezing. Denies chest pains, palpitations and leg swelling C/o abdominal discomfort over the wound and also intestinal hurry, which has lessened    Denies joint pain, swelling and limitation in mobility. Denies headaches, seizures, numbness, or tingling. Denies depression, anxiety , reports poor sleep due to discomfort over abdominal wound Denies skin break down or rash.        Objective:   Physical Exam Patient alert and oriented and in no cardiopulmonary distress.Looks much improved from last visit  HEENT: No facial asymmetry, EOMI, no sinus tenderness,  oropharynx pink and moist.  Neck supple no adenopathy.  Chest: Clear to auscultation bilaterally.  CVS: S1, S2 no murmurs, no S3.  ABD: Soft. Bowel sounds normal.  Ext: No edema  MS:  Adequate ROM spine, shoulders, hips and knees.  Skin: Intact, no ulcerations or rash noted.  Psych: Good eye contact, normal affect. Memory intact not anxious or depressed appearing.  CNS: CN 2-12 intact, power, tone and sensation normal throughout.        Assessment & Plan:

## 2011-09-23 NOTE — Patient Instructions (Addendum)
F/u in 5 weeks.  You will be referred for evaluation of anemia, and also to an endocrinologist for help with the thyroid replacement if your rept lab is still abnormal.And also to a local urologist .  Flu vaccine and pneumonia vaccine today.  [name] is a natural old drug  Which I have been told helps with hot flashes ([place] [facility])  Cbc , anemia panel , chem 7, hepatic and TSH today, vitamin D, HSV 2 today.  Urine for c/s only.  After visit f/u on labs showed improvement in the anemia as well as the thyroid, I spoke with pt's daughter, no referrals are being made to hematology or endocrinology at this time

## 2011-09-23 NOTE — Telephone Encounter (Signed)
Called dr. Mariel Sleet office and cancelled the appt on pt.

## 2011-09-23 NOTE — Assessment & Plan Note (Signed)
Re[peat lab today shows  Normal renal function which is excellent

## 2011-09-23 NOTE — Telephone Encounter (Signed)
Please cancel referral to hematology, she does not need this

## 2011-09-23 NOTE — Assessment & Plan Note (Signed)
Controlled, no change in medication  

## 2011-09-23 NOTE — Assessment & Plan Note (Signed)
Improved. Pt applauded on succesful weight loss through lifestyle change, and encouraged to continue same. Weight loss goal set for the next several months.  

## 2011-09-23 NOTE — Assessment & Plan Note (Signed)
Repeat lab today shows improvement, pt to continue current course

## 2011-09-23 NOTE — Assessment & Plan Note (Signed)
Pt has interstitial cystitis, and now c/o uncontrolled burning with urination. Will check urine c/s only.Refer to urologyu for eval and management of IC

## 2011-09-23 NOTE — Assessment & Plan Note (Signed)
Uncontrolled symptoms, will refer for local urology eval

## 2011-09-25 LAB — URINALYSIS, ROUTINE W REFLEX MICROSCOPIC
Bilirubin Urine: NEGATIVE
Glucose, UA: NEGATIVE
Ketones, ur: NEGATIVE
pH: 5.5

## 2011-09-25 LAB — URINE CULTURE: Colony Count: 70000

## 2011-09-25 LAB — COMPREHENSIVE METABOLIC PANEL
ALT: 14
Alkaline Phosphatase: 71
CO2: 27
GFR calc non Af Amer: >60
Glucose, Bld: 111 — ABNORMAL HIGH
Potassium: 4.1
Sodium: 137
Total Protein: 7

## 2011-09-25 LAB — DIFFERENTIAL
Basophils Relative: 0
Eosinophils Absolute: 0.1
Monocytes Relative: 6
Neutrophils Relative %: 72

## 2011-09-25 LAB — CBC
Hemoglobin: 15.4 — ABNORMAL HIGH
RBC: 4.98

## 2011-09-25 LAB — VITAMIN D 1,25 DIHYDROXY: Vitamin D 1, 25 (OH)2 Total: 22 pg/mL (ref 18–72)

## 2011-09-25 LAB — PREGNANCY, URINE: Preg Test, Ur: NEGATIVE

## 2011-09-26 LAB — HEMOGLOBIN A1C: Hgb A1c MFr Bld: 5.8 % — ABNORMAL HIGH (ref <5.7)

## 2011-09-26 LAB — URINE CULTURE

## 2011-09-27 ENCOUNTER — Other Ambulatory Visit: Payer: Self-pay | Admitting: Family Medicine

## 2011-09-27 DIAGNOSIS — N390 Urinary tract infection, site not specified: Secondary | ICD-10-CM

## 2011-09-27 MED ORDER — CIPROFLOXACIN HCL 500 MG PO TABS: 500.0000 mg | ORAL_TABLET | Freq: Two times a day (BID) | ORAL | Status: AC

## 2011-09-29 ENCOUNTER — Telehealth: Payer: Self-pay | Admitting: Family Medicine

## 2011-09-30 NOTE — Telephone Encounter (Signed)
noted 

## 2011-10-06 ENCOUNTER — Telehealth: Payer: Self-pay | Admitting: *Deleted

## 2011-10-06 DIAGNOSIS — E039 Hypothyroidism, unspecified: Secondary | ICD-10-CM

## 2011-10-06 DIAGNOSIS — D509 Iron deficiency anemia, unspecified: Secondary | ICD-10-CM

## 2011-10-06 DIAGNOSIS — N289 Disorder of kidney and ureter, unspecified: Secondary | ICD-10-CM

## 2011-10-06 DIAGNOSIS — E785 Hyperlipidemia, unspecified: Secondary | ICD-10-CM

## 2011-10-06 NOTE — Telephone Encounter (Signed)
Tests ordered and faxed to [facility]

## 2011-10-06 NOTE — Telephone Encounter (Signed)
Message copied by Diamantina Monks on Tue Oct 06, 2011 10:46 AM ------      Message from: Syliva Overman MD E      Created: Sun Sep 27, 2011 12:49 PM       PLS NOTE, pt aware of uti and cipro sent on 09/30 by me.      Labs for next visit still need to be ordered

## 2011-10-13 ENCOUNTER — Ambulatory Visit: Payer: Medicare Other | Admitting: Urology

## 2011-10-14 ENCOUNTER — Telehealth: Payer: Self-pay | Admitting: Family Medicine

## 2011-10-14 NOTE — Telephone Encounter (Signed)
Letter faxed as requested

## 2011-10-14 NOTE — Telephone Encounter (Signed)
done

## 2011-10-23 ENCOUNTER — Encounter: Payer: Self-pay | Admitting: Family Medicine

## 2011-10-26 ENCOUNTER — Telehealth: Payer: Self-pay | Admitting: Family Medicine

## 2011-10-26 NOTE — Telephone Encounter (Signed)
Do you prescribe this?

## 2011-10-26 NOTE — Telephone Encounter (Signed)
If you get the details from her or if she has filled it [facility] before please ok refill x 3

## 2011-10-28 ENCOUNTER — Ambulatory Visit: Payer: Medicare Other | Admitting: Family Medicine

## 2011-10-30 ENCOUNTER — Other Ambulatory Visit: Payer: Self-pay | Admitting: Family Medicine

## 2011-10-30 NOTE — Telephone Encounter (Signed)
[facility] was unable to provide supplies and spoke verbally to the patient

## 2011-10-31 ENCOUNTER — Telehealth: Payer: Self-pay | Admitting: Family Medicine

## 2011-10-31 NOTE — Telephone Encounter (Signed)
Pt daughter called, she is concerned her Mother has PTSD from her history at [facility] hospital, she states she is not sleeping, she is very anxious, angry at times and threatning to wander off. She has xanax at home and takes 1mg  QID as needed, her daughter states this does not touch her at all. She is not on an SSRI or anti-psychotic medication, previously on Cymbalta for fibromyalgia.   I advised she could go to the ER this weekened if she gets worse, starts to wander or may hurt herself. I also advised to increase her bedtime dose of Xanax to 2mg , and that they may give an 2mg  dose during the day if she is trying to rest, the other doses should remain the same. She voiced understanding. As this is a very complicated patient I did not offer any new mediations via telephone unless she gets them through the ED.  She would like to come in to talk to Dr. Lodema Hong about what they can do with her behavior and mood.

## 2011-11-02 ENCOUNTER — Ambulatory Visit: Payer: Medicare Other | Admitting: Family Medicine

## 2011-11-02 ENCOUNTER — Encounter: Payer: Self-pay | Admitting: Family Medicine

## 2011-11-02 ENCOUNTER — Other Ambulatory Visit: Payer: Self-pay

## 2011-11-02 ENCOUNTER — Emergency Department (HOSPITAL_COMMUNITY)
Admission: EM | Admit: 2011-11-02 | Discharge: 2011-11-03 | Disposition: A | Discharge status: Other Acute Inpatient Hospital | Payer: Medicare Other | Attending: Emergency Medicine | Admitting: Emergency Medicine

## 2011-11-02 DIAGNOSIS — T50902A Poisoning by unspecified drugs, medicaments and biological substances, intentional self-harm, initial encounter: Secondary | ICD-10-CM | POA: Insufficient documentation

## 2011-11-02 DIAGNOSIS — K219 Gastro-esophageal reflux disease without esophagitis: Secondary | ICD-10-CM | POA: Insufficient documentation

## 2011-11-02 DIAGNOSIS — E7800 Pure hypercholesterolemia, unspecified: Secondary | ICD-10-CM | POA: Insufficient documentation

## 2011-11-02 DIAGNOSIS — Z86718 Personal history of other venous thrombosis and embolism: Secondary | ICD-10-CM | POA: Insufficient documentation

## 2011-11-02 DIAGNOSIS — F329 Major depressive disorder, single episode, unspecified: Secondary | ICD-10-CM | POA: Insufficient documentation

## 2011-11-02 DIAGNOSIS — X838XXA Intentional self-harm by other specified means, initial encounter: Secondary | ICD-10-CM | POA: Insufficient documentation

## 2011-11-02 DIAGNOSIS — R109 Unspecified abdominal pain: Secondary | ICD-10-CM | POA: Insufficient documentation

## 2011-11-02 DIAGNOSIS — R404 Transient alteration of awareness: Secondary | ICD-10-CM | POA: Insufficient documentation

## 2011-11-02 DIAGNOSIS — T1491XA Suicide attempt, initial encounter: Secondary | ICD-10-CM

## 2011-11-02 DIAGNOSIS — F3289 Other specified depressive episodes: Secondary | ICD-10-CM | POA: Insufficient documentation

## 2011-11-02 DIAGNOSIS — T50901A Poisoning by unspecified drugs, medicaments and biological substances, accidental (unintentional), initial encounter: Secondary | ICD-10-CM

## 2011-11-02 DIAGNOSIS — Z79899 Other long term (current) drug therapy: Secondary | ICD-10-CM | POA: Insufficient documentation

## 2011-11-02 LAB — RAPID URINE DRUG SCREEN, HOSP PERFORMED
Barbiturates: POSITIVE — AB
Benzodiazepines: POSITIVE — AB
Cocaine: NOT DETECTED
Tetrahydrocannabinol: NOT DETECTED

## 2011-11-02 LAB — CBC
Hemoglobin: 11.9 g/dL — ABNORMAL LOW (ref 12.0–15.0)
MCH: 26.9 pg (ref 26.0–34.0)
RBC: 4.43 MIL/uL (ref 3.87–5.11)
WBC: 8.1 10*3/uL (ref 4.0–10.5)

## 2011-11-02 LAB — COMPREHENSIVE METABOLIC PANEL
AST: 34 U/L (ref 0–37)
Albumin: 3.7 g/dL (ref 3.5–5.2)
CO2: 21 mEq/L (ref 19–32)
Calcium: 10.2 mg/dL (ref 8.4–10.5)
Creatinine, Ser: 0.9 mg/dL (ref 0.50–1.10)
GFR calc non Af Amer: 74 mL/min — ABNORMAL LOW (ref >90)

## 2011-11-02 LAB — DIFFERENTIAL
Eosinophils Absolute: 0.1 10*3/uL (ref 0.0–0.7)
Lymphocytes Relative: 36 % (ref 12–46)
Lymphs Abs: 2.9 10*3/uL (ref 0.7–4.0)
Monocytes Relative: 5 % (ref 3–12)
Neutrophils Relative %: 57 % (ref 43–77)

## 2011-11-02 MED ORDER — ACETAMINOPHEN 325 MG PO TABS: 650.0000 mg | ORAL_TABLET | ORAL | Status: DC | PRN

## 2011-11-02 MED ORDER — SODIUM CHLORIDE 0.9 % IV BOLUS (SEPSIS)
1000.0000 mL | Freq: Once | INTRAVENOUS | Status: AC
  Administered 2011-11-02: 1000 mL via INTRAVENOUS

## 2011-11-02 MED ORDER — ZOLPIDEM TARTRATE 5 MG PO TABS: 5.0000 mg | ORAL_TABLET | Freq: Every evening | ORAL | Status: DC | PRN

## 2011-11-02 MED ORDER — ALUM & MAG HYDROXIDE-SIMETH 200-200-20 MG/5ML PO SUSP: 30.0000 mL | ORAL | Status: DC | PRN

## 2011-11-02 MED ORDER — IBUPROFEN 200 MG PO TABS
600.0000 mg | ORAL_TABLET | Freq: Three times a day (TID) | ORAL | Status: DC | PRN
  Administered 2011-11-03: 600 mg via ORAL
  Filled 2011-11-02: qty 3

## 2011-11-02 MED ORDER — ONDANSETRON HCL 8 MG PO TABS: 4.0000 mg | ORAL_TABLET | Freq: Three times a day (TID) | ORAL | Status: DC | PRN

## 2011-11-02 NOTE — ED Notes (Signed)
Patient explained to nursing that she was told that she had ovarian cancer by her pcp and was scheduled for surgery for a partial hysterectomy. She states that she woke up and they had removed a section of her bowel and left her with an open healing abdominal wound. She states that she then found out that she never had cancer and that the surgery was never needed. She has become extremely depressed since this event and states that today she was just so tired of the pain and everything that she just wanted it to end so she took a cocktail of a handful of percocet, klonopin, xanax, trazodone, and drank some etoh with the meds. Upon arrival she is somnolent but alert. She is slurring her words and is tearful when explaining what happened. Iv started and labs obtained. prtocols intiated. House coverage notified for need of sitter. She remains in sight of the nursing station. St on the monitor. Family at the bedside.

## 2011-11-02 NOTE — ED Notes (Signed)
Pt updated on POC; pt given hot pack for pain in back

## 2011-11-02 NOTE — ED Notes (Addendum)
Pt assisted using bedpan for urine sample. Pts ostomy bag emptied

## 2011-11-02 NOTE — ED Provider Notes (Signed)
History     CSN: [id number] Arrival date & time: 11/02/2011  4:38 PM   First MD Initiated Contact with Patient 11/02/11 1654      Chief Complaint  Patient presents with  . Drug Overdose    (Consider location/radiation/quality/duration/timing/severity/associated sxs/prior treatment) HPI A level 5 caveat is in place due to altered mental status.    Patient presents after an intentional drug overdose. She's accompanied by her daughter who pain that she is giving her text messages complaining of feeling suicidal and wanting to end her life. Her daughter states that she took Klonopin, trazodone, hydrocodone, placed a fentanyl patch on her arm. Several of these prescriptions were approximately 50 years old. Patient does endorsed taking "several" of each of these medications. Daughter removed the fentanyl patch.  Daughter states she sent police looking for mother due to the text messages she had received.  Between 1-2pm police found her in a park- they stated she was awake alert, afterwards, pt became slowly more sleepy and less responsive.  Unknown time of ingestion- but estimated at some time between 1-2pm given history as described above.     Past Medical History  Diagnosis Date  . SVT (supraventricular tachycardia)   . Hypercholesterolemia   . Bilateral ovarian cysts   . Perforation bowel   . PE (pulmonary embolism)   . Thyroid disease   . GERD (gastroesophageal reflux disease)   . Cancer   . Sepsis   . E coli infection   . Acute renal failure   . Anxiety   . Pneumonia     Past Surgical History  Procedure Date  . Ablasion   . Vena cava filter placement   . Abdominal hysterectomy   . Hernia repair   . Colon surgery   . Bowel resection     No family history on file.  History  Substance Use Topics  . Smoking status: Never Smoker   . Smokeless tobacco: Not on file  . Alcohol Use: No    OB History    Grav Para Term Preterm Abortions TAB SAB Ect Mult Living        Review of Systems ROS reviewed and otherwise negative except for mentioned in HPI  Allergies  Biaxin; Clarithromycin; Cleocin; Codeine; Keflex; Nitrofurantoin; Penicillins; Scopolamine hbr; Sulfonamide derivatives; and Tape  Home Medications   Current Outpatient Rx  Name Route Sig Dispense Refill  . ALPRAZOLAM 1 MG PO TABS Oral Take 1 mg by mouth 4 (four) times daily.      Marland Kitchen BUTALBITAL-APAP-CAFFEINE 50-325-40 MG PO TABS Oral Take 1 tablet by mouth 2 (two) times daily as needed. For migraines     . CYCLOBENZAPRINE HCL 10 MG PO TABS  TAKE 1 TABLET THREE TIMES A DAY 270 tablet 1  . HYDROCODONE-ACETAMINOPHEN 10-325 MG PO TABS      . LEVOTHYROXINE SODIUM 100 MCG PO TABS  TAKE 1 TABLET DAILY 90 tablet 1  . OMEPRAZOLE 40 MG PO CPDR Oral Take 1 capsule (40 mg total) by mouth daily. 90 capsule 1  . ONDANSETRON HCL 4 MG PO TABS Oral Take 4 mg by mouth every 8 (eight) hours as needed. For nausea      . PRAVASTATIN SODIUM 80 MG PO TABS  TAKE 1 TABLET EVERY EVENING , DOSE INCREASE 90 tablet 1  . SPIRONOLACTONE 25 MG PO TABS Oral Take 1 tablet (25 mg total) by mouth 2 (two) times daily. 180 tablet 0    BP 106/88  Pulse 107  Temp(Src)  98.6 F (37 C) (Oral)  Resp 14  SpO2 94% Vitals reviewed Physical Exam Physical Examination: General appearance - tired appearing, sleeping but arousable Mental status - depressed mood, awake, oriented to person and place, not to time Eyes - pupils equal and reactive, extraocular eye movements intact, pupils small bilaterally- reactive Mouth - mucous membranes moist, pharynx normal without lesions Chest - clear to auscultation, no wheezes, rales or rhonchi, symmetric air entry Heart - normal rate, regular rhythm, normal S1, S2, no murmurs, rubs, clicks or gallops Abdomen - colostomy in place with brown stool, abdomen with multiple well healed scars, diffusely tender to palpation- daughter and patient state this is baseline for her Neurological - motor and  sensory grossly normal bilaterally Musculoskeletal - no joint tenderness, deformity or swelling Extremities - peripheral pulses normal, no pedal edema, no clubbing or cyanosis Skin - normal coloration and turgor, no rashes, no suspicious skin lesions noted  ED Course  Procedures (including critical care time)   Date: 11/02/2011  Rate: 97  Rhythm: normal sinus rhythm  QRS Axis: normal  Intervals: normal  ST/T Wave abnormalities: normal  Conduction Disutrbances:none  Narrative Interpretation:   Old EKG Reviewed: compared to prior of 08/12/09 rate faster, but no other significant changes    Labs Reviewed  POCT PREGNANCY, URINE  PREGNANCY, URINE  ACETAMINOPHEN LEVEL  SALICYLATE LEVEL  ETHANOL  URINE RAPID DRUG SCREEN (HOSP PERFORMED)  COMPREHENSIVE METABOLIC PANEL  CBC  DIFFERENTIAL   No results found.   No diagnosis found.  10:03 PM D/w Poison control, labs reviewed, pt condition- they state she is medically cleared.  Pt more awake, alert.  Has tolerated po trial.   D/w ACT team- they will assess patient in the ED   MDM  Pt s/p suicide attempt with ingestion of multiple substances.  Pt sent daughter a suicide note via text message.  States she has been depressed due to medical problems resulting from her abdominal surgery.  Pt with labs, CR monitor, EKG- all reassuring.  Pt has returned to her mental status baseline and is awake and tolerated Po trial.  Sitter at bedside.  D/w ACT team and they will assess in the ED        Ethelda Chick, MD 11/02/11 2302

## 2011-11-02 NOTE — ED Notes (Signed)
Pt undressed and placed in gown. Pt placed on cardiac marker, bp cuff, and pulse ox. Fall risk bracelet applied

## 2011-11-02 NOTE — ED Notes (Addendum)
Sitter and family at bedside; introduced self to pt; pt reports that she is in pain, expresses that she wants to go home

## 2011-11-02 NOTE — ED Notes (Signed)
Talked with Alona Bene at poison control and release on her part and notified ER DR.

## 2011-11-02 NOTE — ED Notes (Signed)
Old and new EKG given to Dr. Karma Ganja, copies placed in chart.

## 2011-11-02 NOTE — ED Notes (Signed)
No respiratory or acute distress noted resting in bed alert and oriented x 3 sitter at bedside call light in reach.

## 2011-11-02 NOTE — ED Notes (Signed)
She took numerus meds approx 1330.  She has been drinking alcohol.  She she took trazadone  Percocet klonopin and she also had a fentanyl patch the daughter took off.  She has major abd surgery July 2012.  And she has an open incision on her abd

## 2011-11-02 NOTE — ED Notes (Signed)
Report received; pt to room 20; pt on monitor

## 2011-11-02 NOTE — ED Notes (Signed)
Poison control notified and their suggestions are to draw tylenol level and watch liver function. If pt has taken too much trazodone there is potential for increased qt interval. Supportive care is recommended with iv fluids and cardiac monitoring.

## 2011-11-03 ENCOUNTER — Telehealth: Payer: Self-pay | Admitting: Family Medicine

## 2011-11-03 ENCOUNTER — Encounter (HOSPITAL_COMMUNITY): Payer: Self-pay | Admitting: *Deleted

## 2011-11-03 ENCOUNTER — Inpatient Hospital Stay (HOSPITAL_COMMUNITY)
Admission: AD | Admit: 2011-11-03 | Discharge: 2011-11-05 | DRG: 882 | Disposition: A | Discharge status: Home / Self Care | Payer: No Typology Code available for payment source | Source: Ambulatory Visit | Attending: Psychiatry | Admitting: Psychiatry

## 2011-11-03 DIAGNOSIS — F329 Major depressive disorder, single episode, unspecified: Secondary | ICD-10-CM | POA: Diagnosis present

## 2011-11-03 DIAGNOSIS — F4323 Adjustment disorder with mixed anxiety and depressed mood: Principal | ICD-10-CM

## 2011-11-03 DIAGNOSIS — C50919 Malignant neoplasm of unspecified site of unspecified female breast: Secondary | ICD-10-CM

## 2011-11-03 HISTORY — DX: Major depressive disorder, single episode, unspecified: F32.9

## 2011-11-03 HISTORY — DX: Depression, unspecified: F32.A

## 2011-11-03 MED ORDER — HYDROCODONE-ACETAMINOPHEN 5-325 MG PO TABS
2.0000 | ORAL_TABLET | Freq: Four times a day (QID) | ORAL | Status: DC | PRN
  Administered 2011-11-03: 2 via ORAL
  Filled 2011-11-03: qty 2

## 2011-11-03 MED ORDER — ALPRAZOLAM 0.5 MG PO TABS
1.0000 mg | ORAL_TABLET | Freq: Three times a day (TID) | ORAL | Status: DC
  Administered 2011-11-03: 1 mg via ORAL
  Filled 2011-11-03: qty 2

## 2011-11-03 MED ORDER — CYCLOBENZAPRINE HCL 10 MG PO TABS
5.0000 mg | ORAL_TABLET | Freq: Three times a day (TID) | ORAL | Status: DC
  Administered 2011-11-03: 10 mg via ORAL
  Filled 2011-11-03: qty 2

## 2011-11-03 MED ORDER — LEVOTHYROXINE SODIUM 100 MCG PO TABS
100.0000 ug | ORAL_TABLET | Freq: Every day | ORAL | Status: DC
  Administered 2011-11-03: 100 ug via ORAL
  Filled 2011-11-03 (×2): qty 1
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
## 2011-11-03 NOTE — ED Notes (Signed)
ACT Team at bedside. Family at bedside.

## 2011-11-03 NOTE — ED Notes (Signed)
Pt remains pleasant, calm & cooperative. Awaiting bed assignment. Pt inquiring about home meds. ED MD informed & aware

## 2011-11-03 NOTE — ED Notes (Signed)
Act team at bedside speaking with pt

## 2011-11-03 NOTE — ED Notes (Signed)
Patient is resting comfortably. Medicated for pain & "spasms".

## 2011-11-03 NOTE — ED Notes (Signed)
Tele psych, at bedside.
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
## 2011-11-03 NOTE — ED Notes (Signed)
IVC papers served by [employer]. Currently eating lunch tray

## 2011-11-03 NOTE — Telephone Encounter (Signed)
Pt called in from [facility] where she has been since yesterday , currently awaiting a bed at behavioral health. She should have been in the office yesterday, however, when we called family stated pt left home   stating she was tired , goodbye and she loved them(in essence) The police were notified , by family, and  took patient against her will to the hospita.   She has had telepsych in the ED per her spouse, and is awaiting a bed at [facility].(in patient psych) Pt states she has been a significant amt of pain for a while following the recent abdominal, also is c/o back pain, states she has started having anxiety attacks, "found some klonopin" which she had in the past and took some. Pt had been on oxycontin for a long time for chronic pain, and has sucesfully weaned off of this approx 2 years ago, done through [facility]. She herself had wanted to be off the narcotic med for some time. She has recently had opelvic/abdominal surgery at [facility] for what turned out to be a benign ovarian lesion. The course has been extremely complicated , and the f/u care for this is now through [facility]. Her intestine is exposed to the atmosphere, the inte intent is that it heal slowly over time form within. I have told pt and her spouse that I will attempt to speak with the doctor who is in charge of her care at the ED  Regarding her c/o pain which she states is not being addressed  I have been told that a nurse will speak with me since they have "not been able to figure out which doctor is taking care of her" At which point I have said OK and am still waiting to speak with someone. I have spoken with Maxine Glenn the nurse taking care of Ms Hunsucker , she states the doctor who is caring for pt is busy, I left a tele contact number and ask that they callme. I made it clear that I was not attempting to direct management in the ED, but that I would like to speak with the doc, because of the call from the pt.

## 2011-11-03 NOTE — ED Notes (Signed)
Report received, assumed care.  

## 2011-11-04 DIAGNOSIS — F329 Major depressive disorder, single episode, unspecified: Secondary | ICD-10-CM | POA: Diagnosis present

## 2011-11-04 DIAGNOSIS — F339 Major depressive disorder, recurrent, unspecified: Secondary | ICD-10-CM

## 2011-11-04 LAB — COMPREHENSIVE METABOLIC PANEL
ALT: 21 U/L (ref 0–35)
AST: 17 U/L (ref 0–37)
Alkaline Phosphatase: 121 U/L — ABNORMAL HIGH (ref 39–117)
CO2: 29 mEq/L (ref 19–32)
Chloride: 102 mEq/L (ref 96–112)
GFR calc Af Amer: 74 mL/min — ABNORMAL LOW (ref >90)
GFR calc non Af Amer: 64 mL/min — ABNORMAL LOW (ref >90)
Glucose, Bld: 100 mg/dL — ABNORMAL HIGH (ref 70–99)
Sodium: 142 mEq/L (ref 135–145)
Total Bilirubin: 0.2 mg/dL — ABNORMAL LOW (ref 0.3–1.2)

## 2011-11-04 MED ORDER — PRAVASTATIN SODIUM 40 MG PO TABS
80.0000 mg | ORAL_TABLET | Freq: Every day | ORAL | Status: DC
  Administered 2011-11-04: 80 mg via ORAL
  Filled 2011-11-04 (×4): qty 2

## 2011-11-04 MED ORDER — NON FORMULARY: 80.0000 mg | Freq: Every day | Status: DC

## 2011-11-04 MED ORDER — ALUM & MAG HYDROXIDE-SIMETH 200-200-20 MG/5ML PO SUSP: 30.0000 mL | ORAL | Status: DC | PRN

## 2011-11-04 MED ORDER — LEVOTHYROXINE SODIUM 100 MCG PO TABS
100.0000 ug | ORAL_TABLET | Freq: Every day | ORAL | Status: DC
  Administered 2011-11-04 – 2011-11-05 (×2): 100 ug via ORAL
  Filled 2011-11-04: qty 1
  Filled 2011-11-04: qty 2
  Filled 2011-11-04 (×3): qty 1

## 2011-11-04 MED ORDER — ALPRAZOLAM 1 MG PO TABS
1.0000 mg | ORAL_TABLET | Freq: Four times a day (QID) | ORAL | Status: DC
  Administered 2011-11-04 – 2011-11-05 (×5): 1 mg via ORAL
  Filled 2011-11-04 (×5): qty 1

## 2011-11-04 MED ORDER — ACETAMINOPHEN 325 MG PO TABS: 650.0000 mg | ORAL_TABLET | Freq: Four times a day (QID) | ORAL | Status: DC | PRN

## 2011-11-04 MED ORDER — CYCLOBENZAPRINE HCL 10 MG PO TABS
10.0000 mg | ORAL_TABLET | ORAL | Status: DC
  Administered 2011-11-04 – 2011-11-05 (×4): 10 mg via ORAL
  Filled 2011-11-04 (×7): qty 1

## 2011-11-04 MED ORDER — HYDROCODONE-ACETAMINOPHEN 10-325 MG PO TABS
1.0000 | ORAL_TABLET | ORAL | Status: DC | PRN
  Administered 2011-11-04 – 2011-11-05 (×5): 1 via ORAL
  Filled 2011-11-04 (×5): qty 1

## 2011-11-04 MED ORDER — MAGNESIUM HYDROXIDE 400 MG/5ML PO SUSP: 30.0000 mL | Freq: Every day | ORAL | Status: DC | PRN

## 2011-11-04 NOTE — Progress Notes (Signed)
Assessment Note: Pt. Lying in bed this am; she is in extreme pain in lower abdominal region.  She has a stoma with colostomy bag.  It has not been changed in 2 days.  Patient is still recovering from surgery she had in September.  She had her ovary removed and her large intestine was perforated.  She is due to have additional surgeries in the future to correct this.  She is very remorseful over her overdose and is embarrassed that it happened.  She wants to be at home so she can take care of herself.  She denies any SI/HI/AVH.  Her husband is supposed to be bringing additional colostomy bags over, but they are on order from [place].  She also has a wound consult today to have her stoma assessed.  She has a wheelchair to ambulate due to weakness and pain.  She rates her depression as a 5 and denies any hopelessness.  She states that "this was a wakeup call for me."  Continue to assess patient and maintain safety.  Offer support as needed.

## 2011-11-04 NOTE — Progress Notes (Signed)
[facility] Group Notes:  (Counselor/Nursing/MHT/Case Management/Adjunct)  11/04/2011 1:24 PM  Type of Therapy:  Group Therapy  Participation Level:  Did Not Attend    [name] 11/04/2011, 1:24 PM
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
## 2011-11-04 NOTE — H&P (Signed)
  I have reviewed the review of systems and assessed  the patient for any variances in the physical exam performed in the emergency room.

## 2011-11-04 NOTE — Telephone Encounter (Signed)
[facility] Ed physician did call back following the initial contact which I made after Yudith called from the Ed. He understood clearly that my intent was not to direct his management, and assured me that he c/o pain was not only heard, but was being adressed.

## 2011-11-04 NOTE — Progress Notes (Signed)
Suicide Risk Assessment  Admission Assessment     Loss Factors:  Loss Factors: Decline in physical health Risk Reduction Factors:  Risk Reduction Factors: Sense of responsibility to family;Religious beliefs about death;Living with another person, especially a relative;Positive social support  CLINICAL FACTORS:   Depression:   Insomnia Chronic Pain Medical Diagnoses and Treatments/Surgeries  Diagnosis:   Axis I: Adjustment Disorder with Mixed Anxiety and Depressed Mood.   The patient was seen today and reports the following:   ADL's: Intact  Sleep: Difficulty sleeping secondary to back pain.  Appetite: Yes, AEB: Good Appetite.   Mild>(1-10) >Severe  Hopelessness (1-10): 0  Depression (1-10): 2  Anxiety (1-10): 2-3   Suicidal Ideation: Adamantly denies any suicidal ideations.  Plan: No  Intent: No  Means: No  Homicidal Ideation: Adamantly denies any Homicidal Ideations.  Plan: No  Intent: No  Means: No   Mental Status:  AO x 3 General Appearance /Behavior: Neat and Casual  Eye Contact: Good  Motor Behavior: Normal  Speech: Normal  Level of Consciousness: Alert  Mood: Mildly Depressed.  Affect: Slightly Constricted Anxiety Good Control.  Thought Process: Coherent  Thought Content: WNL with no auditory or visual hallucinations or delusional thinking.  Perception: Normal  Judgment: Fair  Insight: Good.  Cognition: Orientation time, place and person   Treatment Plan Summary:  1. Daily contact with patient to assess and evaluate symptoms and progress in treatment  2. Medication management  3. The patient will deny suicidal ideations for 48 hours prior to discharge and have a depression and anxiety rating of 3 or less.   Plan:  1. Continue current medications.  2. Patient does not wish to start an antidepressant medication at this time.  3. Continue to monitor.  SUICIDE RISK:   Minimal: No identifiable suicidal ideation.  Patients presenting with no risk factors  but with morbid ruminations; may be classified as minimal risk based on the severity of the depressive symptoms.   [name] 11/04/2011, 2:05 PM

## 2011-11-04 NOTE — Consult Note (Signed)
WOC consult: requested to eval. Pt for chronic fistula pouching.    Pt with fistula hx mid-abdominal x several months, reports multiple surgeries and fistula result of bowel surgeries at [facility].  Has been independent at home with assist of husband with use of Eakin pouch to manage this output.  Has last changed current pouch 2 days ago.  Now admitted to [facility] for attempted suicide and needed assist with management while inpatient in this unit. Assisted pt at beside with other WOC nurse and bedside nurse to remove old pouch.  Pt has liquid brown effluent in moderate to high amounts.  Pt was able to remove pouch and then we assisted her to remove all remaining paste and skin barrier from skin.   After lengthy process of removing this and cleansing skin thoroughly "crusted" skin with skin barrier wipes and ostomy powder.  All abdominal creases filled with skin barrier and perifistula surfaces built up with skin barrier at proximal and distal ends of wound.  New large Eakin pouch cut to fit wound size and placed over surface, bead of ostomy paste applied at cut edge of back of Eakin wafer prior to application.  Waterproof tape used to window frame pouch.  Requested pt to hold pressure and warmth of hands to pouch to aid with seal of pouch to skin, she will as well use warm packs on the pouch to aid with seal.  Pt will also stay flat in bed for 1 hour or so and her bedside nurse is aware of this need.  Bedside nurse has observed the application process and the patient is very familiar with pouching process and verbalized that she would be able to assist bedside nurse if needed to reapply should pouch leak before pt discharged/or during the night.    Re consult if needed, will not follow at this time. Thanks  Graclyn Lawther [employer], CWOCN [phone])

## 2011-11-04 NOTE — Progress Notes (Signed)
Recreation Therapy Group Note  Date: 11/04/2011         Time: 1415      Group Topic/Focus: The focus of this group is on enhancing patients' ability to work cooperatively with others. Groups discusses barriers to cooperation and strategies for successful cooperation.   Participation Level: None  Participation Quality: Appropriate and Attentive  Affect: Appropriate  Cognitive: Appropriate and Oriented   Additional Comments: Patient present for group, cooperative and pleasant with staff. Patient reported she couldn't participate in the activity due to a recent surgery, but agreed to the accommodations suggested by the recreation therapist. Patient called out of group by RN and did not return.

## 2011-11-04 NOTE — Progress Notes (Signed)
[place] reported her depression and anxiety at a 1 when SW met her this afternoon reports no SI. Pt sees Dr. Kieth Brightly at [facility] for therapy.  She is currently not on psychiatric meds although she reports taking cymbalta before. Pt does have a supportive husband named Lorella Nimrod who will be taking her back home upon d/c. Jeannette How, LCSW 11/04/2011 4:11 PM

## 2011-11-04 NOTE — Progress Notes (Signed)
  This is job number [id number] and is my psychiatric admission note by Landry Corporal, NP

## 2011-11-05 ENCOUNTER — Telehealth: Payer: Self-pay | Admitting: Family Medicine

## 2011-11-05 ENCOUNTER — Other Ambulatory Visit: Payer: Self-pay | Admitting: Family Medicine

## 2011-11-05 DIAGNOSIS — Z7189 Other specified counseling: Secondary | ICD-10-CM

## 2011-11-05 LAB — TSH: TSH: 3.621 u[IU]/mL (ref 0.350–4.500)

## 2011-11-05 MED ORDER — HYDROCODONE-ACETAMINOPHEN 10-325 MG PO TABS: 1.0000 | ORAL_TABLET | Freq: Once | ORAL | Status: DC

## 2011-11-05 NOTE — Progress Notes (Signed)
Pt. Denies SI/HI and reports an understanding of discharge.  Pt. Reports that she has all her belongings. Encouragement and support given.  Pt. Discharged and escorted from unit by Clinical research associate.

## 2011-11-05 NOTE — Progress Notes (Signed)
Suicide Risk Assessment  Discharge Assessment     Demographic factors:  Assessment Details Time of Assessment: Admission Information Obtained From: Patient Current Mental Status:  Current Mental Status:  (denies) Risk Reduction Factors:  Risk Reduction Factors: Sense of responsibility to family;Religious beliefs about death;Living with another person, especially a relative;Positive social support  CLINICAL FACTORS:   Severe Anxiety and/or Agitation Depression:   Impulsivity Chronic Pain More than one psychiatric diagnosis Previous Psychiatric Diagnoses and Treatments Medical Diagnoses and Treatments/Surgeries  Loss Factors: Loss Factors: Decline in physical health   Diagnosis:  Axis I: Adjustment Disorder with Mixed Anxiety and Depressed Mood.   The patient was seen today and reports the following:  ADL's: Intact  Sleep: The patient reports to sleeping well.  Appetite: Yes, AEB: Good Appetite.   Mild>(1-10) >Severe  Hopelessness (1-10): 0  Depression (1-10): 0  Anxiety (1-10): 0   Suicidal Ideation: Adamantly denies any suicidal ideations.  Plan: No  Intent: No  Means: No  Homicidal Ideation: Adamantly denies any Homicidal Ideations.  Plan: No  Intent: No  Means: No   Mental Status: AO x 3  General Appearance /Behavior: Neat and Casual  Eye Contact: Good  Motor Behavior: Normal  Speech: Normal  Level of Consciousness: Alert  Mood: Euthymic.  Affect: Bright and Full  Anxiety Good Control.  Thought Process: Coherent  Thought Content: WNL with no auditory or visual hallucinations or delusional thinking.  Perception: Normal  Judgment: Good  Insight: Good.  Cognition: Orientation time, place and person   Treatment Plan Summary:  1. Daily contact with patient to assess and evaluate symptoms and progress in treatment  2. Medication management  3. The patient will deny suicidal ideations for 48 hours prior to discharge and have a depression and anxiety rating of 3  or less.   Plan:  1. Continue current medications.  2. The patient will be discharged today to outpatient care of outlined on her discharge instructions.  3. Discharge home today.  SUICIDE RISK:   Minimal: No identifiable suicidal ideation.  Patients presenting with no risk factors but with morbid ruminations; may be classified as minimal risk based on the severity of the depressive symptoms  PLAN OF CARE:  Randy Readling 11/05/2011, 1:34 PM

## 2011-11-05 NOTE — Discharge Summary (Signed)
Pt was unable to attend d/c planning group on this date.  Met with pt individually at this time and discussed d/c plans.  Pt presents with bright affect and mood.  Pt states she is not depressed or has SI and feels embarrassed that she felt that way before coming into the hospital.  SW spoke with pt about safety planning and suicide prevention and pt was able to identify who to call if she felt depressed or suicidal again.  Pt will f/u with Dr. Kieth Brightly at [facility] in [place].  Pt will return to own home and husband will transport pt home.  Met pt's goals by scheduling f/u and discussing safety planning and suicide prevention.  No further d/c needs voiced.    Reyes Ivan, LCSWA 11/05/2011  12:08 PM

## 2011-11-05 NOTE — Progress Notes (Signed)
  Pt was in the hallway at the time of this assessment. Pt was pleasant and appropriate during assessment. Pt denies any SI/HI,AVH, but reports constant pain from surgical incision related to bowel surgery to repair obstruction. Pt was given Hydrocodone 10-325 mg at this time for pain. Support was given, Q15 checks continued, pt remains safe on the unit.

## 2011-11-05 NOTE — Progress Notes (Signed)
[name] explored the balance between emotions and reason in her life. She talked about her tendency to be swept up in emotions, and was able to recognize ways that she could bring more balance into her life. [name] also shared positive thoughts of self-worth. She is aware of her personal risks for suicide and counselor attempted to contact her husband for suicide prevention education. Because counselor was unable to speak with husband, suicide prevention pamphlet was given to and discussed with [name].

## 2011-11-05 NOTE — Progress Notes (Signed)
[facility] Family/Significant Other Suicide Prevention Education  Suicide Prevention Education:  Contact Attempts: Shaquera Ansley, husband, (name of family member/significant other) has been identified by the patient as the family member/significant other with whom the patient will be residing, and identified as the person(s) who will aid the patient in the event of a mental health crisis.  With written consent from the patient, two attempts were made to provide suicide prevention education, prior to and/or following the patient's discharge.  We were unsuccessful in providing suicide prevention education.  A suicide education pamphlet was given to the patient to share with family/significant other.  Date and time of first attempt: 11/05/2011 @ 9:47 Date and time of second attempt: 11/05/2011@ 1341  Billie Lade 11/05/2011, 1:43 PM

## 2011-11-05 NOTE — Telephone Encounter (Signed)
pls refer pt to  gynae of   patient's choice  Re hormone therapy,I am putting in a referral, let her know

## 2011-11-05 NOTE — Progress Notes (Signed)
  Discharge Note (Initial)  Pt is optimistic and says she plans to followup with her doctor immediately about her colostomy.  She says that her pain is intense with burning on the R side of hr colostomy.  It is warm to the touch.  Asked if pt wanted to be seen at ED prior to leaving and she declined, saying she would rather see her MD.  Pt verbalizes understanding of her discharge meds and followup.  Pt was given suicide prevention  Information.  She is waiting for her husband to come and bring her clothes.

## 2011-11-05 NOTE — Progress Notes (Signed)
[facility] Group Notes:  (Counselor/Nursing/MHT/Case Management/Adjunct)  11/05/2011 1:36 PM  Type of Therapy:  Group Therapy  Participation Level:  Minimal  Participation Quality:  Attentive  Affect:  Appropriate  Cognitive:  Appropriate  Insight:  Limited  Engagement in Group:  Limited  Engagement in Therapy:  Good  Modes of Intervention:  Education  Summary of Progress/Problems: Safia was attentive and engaged in group therapy her participation was minimal but did show her understanding of the wise mind and the components that produce an effective means of use.  She summarized rational mind and emotional mind being a point of reference for situations interchangeably resulting in a tendency to misuse one hemisphere over the other.  Labella was not open to share personal experiences related to wise mind and how it applies to her life.   Quincy Sheehan 11/05/2011, 1:36 PM

## 2011-11-05 NOTE — Tx Team (Signed)
Interdisciplinary Treatment Plan Update (Adult)  Date:  11/05/2011 Time Reviewed:  10:14 AM  Progress in Treatment: Attending groups: No. Participating in groups:  No. Taking medication as prescribed:  Yes. Tolerating medication:  Yes. Family/Significant othe contact made:  No, will contact:  consent given to speak to Husband Patient understands diagnosis:  Yes. Discussing patient identified problems/goals with staff:  Yes. Medical problems stabilized or resolved:  Yes. Denies suicidal/homicidal ideation: Yes. Issues/concerns per patient self-inventory:  No. Other:  New problem(s) identified: none identifies  Reason for Continuation of Hospitalization: Other; describe pt d/c today  Interventions implemented related to continuation of hospitalization:  Additional comments:  Estimated length of stay:d/c today  Discharge Plan: plan will follow up with [facility] in [place] with Dr. Lina Sayre goal(s):  Review of initial/current patient goals per problem list:   1.  Goal(s): depression  Met:  Yes  Target date: by d/c date  As evidenced by: pt denies depression  2.  Goal (s): suicidal ideation  Met:  Yes  Target date: by d/c date  As evidenced by: pt denies SI  3.  Goal(s):  Met:  No  Target date:  As evidenced by:  4.  Goal(s):  Met:  No  Target date:  As evidenced by:  Attendees: Patient:   11/8/201210:14 AM  Family:   11/8/201210:14 AM  Physician:  Franchot Gallo, MD 11/8/201210:14 AM  Nursing:   Neill Loft 11/8/201210:14 AM  Case Manager:  Jeannette How, LCSW 11/8/201210:14 AM  Counselor:  Darrold Span, LCSW 11/8/201210:14 AM  Other:  Reyes Ivan, LCSWA 11/8/201210:14 AM  Other:  Harl Favor, intern 11/8/201210:14 AM  Other:  Cordie Grice 11/8/201210:14 AM  Other:  Rochel Brome, LCSW 11/8/201210:14 AM   Scribe for Treatment Team:   Jeannette How, 11/05/2011, 10:14 AM

## 2011-11-05 NOTE — Assessment & Plan Note (Signed)
NAME:  [name], [name] NO.:  [id number]  MEDICAL RECORD NO.:  [id number]  LOCATION:  0501                          FACILITY:  [facility]  PHYSICIAN:  Franchot Gallo, MD     DATE OF BIRTH:  1961/10/30  DATE OF ADMISSION:  11/03/2011 DATE OF DISCHARGE:                      PSYCHIATRIC ADMISSION ASSESSMENT   IDENTIFYING INFORMATION:  This is a 50 year old female who was voluntarily admitted on November 03, 2011.  HISTORY OF PRESENT ILLNESS:  The patient is here after an overdose, after just tired of dealing with all of her medical issues.  She had taken some of her Klonopin, trazodone hydrocodone and placed a fentanyl patch on her arm. She had text her daughter.  She was further assessed in the emergency department.  She was reporting that it was not an actual suicide attempt, but was sent over to [facility] for further assessment.  PAST PSYCHIATRIC HISTORY:  [facility] No current outpatient mental health treatment.  SOCIAL HISTORY:  This is a married female, she has one adult daughter. She lives in [place].  MEDICAL PROBLEMS: 1. History of adenocarcinoma of the left breast. 2. Hypothyroidism. 3. Hyperlipidemia. 4. Obesity. 5. Iron-deficiency anemia. 6. Chronic anxiety and depression. 7. Carpal tunnel syndrome. 8. Acute cystitis. 9. Ovarian cyst. 10.Chronic neck and back pain. 11.Fibromyalgia. 12.Unspecified tachycardia, supraventricular tachycardia. 13.Small-bowel obstruction. 14.Fistula. 15.History of acute renal insufficiency. 16.Abdominal wound dehiscence.  MEDICATION: 1. Alprazolam 1 mg q.i.d. 2. Flexeril 10 mg t.i.d. 3. Synthroid 100 mcg daily. 4. Pravastatin 80 mg daily. 5. Hydrocodone 10/325 1 tab q.4 hours p.r.n. for moderate to severe     pain.  PRIMARY CARE PROVIDER:  Milus Mallick. Lodema Hong, M.D.  PHYSICAL EXAM:  Was reviewed.  This is a middle-aged female, with significant medical problems, currently  with a colostomy and complaining of some overall abdominal pain that radiates to her back.  She does not appear to be in any acute distress at this time.  Colostomy has some brown stool, abdomen was tender to palpation.  LABORATORY DATA:  Shows a CMP that shows a glucose of 105, her acetaminophen level less than 15, alcohol level is less than 11.  MENTAL STATUS EXAM:  The patient was seen, she was resting in bed.  She is a good historian.  She is somewhat disheveled.  She is currently dressed in a hospital gown.  She has good eye contact.  Her thought processes overall are coherent and goal directed.  She feels guilty about her overdose, asking about discharge.  She provides a good history as to all her medical issues.  Her memory appears intact.  Judgment insight are fair at this time.  DIAGNOSES:  Axis I:  Major depressive disorder.  Generalized anxiety disorder. Axis II:  Deferred. Axis III:  Hypercholesteremia, supraventricular tachycardia, perforated bowel, history of pulmonary embolism, thyroid disease, gastroesophageal reflux disease, cancer, sepsis, history of Enterococcus coli infection, acute renal failure, anxiety and pneumonia. Axis IV:  Medical problems, possible problems with her primary support group. Axis V:  35.  PLAN:  Is to review her medications.  We will have wound care to assist [place] with colostomy care.  The patient may benefit from  an antidepressant. We will also contact family for any other safety concerns.  The patient is to follow up with her significant medical issues.  Her tentative length of stay at this time, is 3-4 days.     Landry Corporal, N.P.   ______________________________ Franchot Gallo, MD    JO/MEDQ  D:  11/04/2011  T:  11/05/2011  Job:  [id number]

## 2011-11-09 ENCOUNTER — Ambulatory Visit: Payer: Medicare Other | Admitting: Family Medicine

## 2011-11-09 ENCOUNTER — Encounter: Payer: Self-pay | Admitting: Family Medicine

## 2011-11-09 ENCOUNTER — Ambulatory Visit (INDEPENDENT_AMBULATORY_CARE_PROVIDER_SITE_OTHER): Payer: Medicare Other | Admitting: Psychology

## 2011-11-09 DIAGNOSIS — F0631 Mood disorder due to known physiological condition with depressive features: Secondary | ICD-10-CM

## 2011-11-09 DIAGNOSIS — F329 Major depressive disorder, single episode, unspecified: Secondary | ICD-10-CM

## 2011-11-09 DIAGNOSIS — F3289 Other specified depressive episodes: Secondary | ICD-10-CM

## 2011-11-09 DIAGNOSIS — F411 Generalized anxiety disorder: Secondary | ICD-10-CM

## 2011-11-09 DIAGNOSIS — F419 Anxiety disorder, unspecified: Secondary | ICD-10-CM

## 2011-11-09 DIAGNOSIS — F32A Depression, unspecified: Secondary | ICD-10-CM

## 2011-11-09 DIAGNOSIS — F0630 Mood disorder due to known physiological condition, unspecified: Secondary | ICD-10-CM

## 2011-11-09 NOTE — Progress Notes (Deleted)
Psychiatric Assessment Adult  Patient Identification:  [name] Date of Evaluation:  11/09/2011 Chief Complaint: *** History of Chief Complaint:  No chief complaint on file.   HPI Review of Systems Physical Exam  Depressive Symptoms: {DEPRESSION SYMPTOMS:20000}  (Hypo) Manic Symptoms:   Elevated Mood:  {BHH YES OR NO:22294} Irritable Mood:  {BHH YES OR NO:22294} Grandiosity:  {BHH YES OR NO:22294} Distractibility:  {BHH YES OR NO:22294} Labiality of Mood:  {BHH YES OR NO:22294} Delusions:  {BHH YES OR NO:22294} Hallucinations:  {BHH YES OR NO:22294} Impulsivity:  {BHH YES OR NO:22294} Sexually Inappropriate Behavior:  {BHH YES OR NO:22294} Financial Extravagance:  {BHH YES OR NO:22294} Flight of Ideas:  {BHH YES OR NO:22294}  Anxiety Symptoms: Excessive Worry:  {BHH YES OR NO:22294} Panic Symptoms:  {BHH YES OR NO:22294} Agoraphobia:  {BHH YES OR NO:22294} Obsessive Compulsive: {BHH YES OR NO:22294}  Symptoms: {Obsessive Compulsive Symptoms:22671} Specific Phobias:  {BHH YES OR NO:22294} Social Anxiety:  {BHH YES OR NO:22294}  Psychotic Symptoms:  Hallucinations: {BHH YES OR NO:22294} {Hallucinations:22672} Delusions:  {BHH YES OR NO:22294} Paranoia:  {BHH YES OR NO:22294}   Ideas of Reference:  {BHH YES OR NO:22294}  PTSD Symptoms: Ever had a traumatic exposure:  {BHH YES OR NO:22294} Had a traumatic exposure in the last month:  {BHH YES OR NO:22294} Re-experiencing: {BHH YES OR NO:22294} {Re-experiencing:22673} Hypervigilance:  {BHH YES OR NO:22294} Hyperarousal: {BHH YES OR NO:22294} {Hyperarousal:22674} Avoidance: {BHH YES OR NO:22294} {Avoidance:22675}  Traumatic Brain Injury: {BHH YES OR NO:22294} {Traumatic Brain Injury:22676}  Past Psychiatric History: Diagnosis: ***  Hospitalizations: ***  Outpatient Care: ***  Substance Abuse Care: ***  Self-Mutilation: ***  Suicidal Attempts: ***  Violent Behaviors: ***   Past Medical History:   Past  Medical History  Diagnosis Date  . SVT (supraventricular tachycardia)   . Hypercholesterolemia   . Bilateral ovarian cysts   . Perforation bowel   . PE (pulmonary embolism)   . Thyroid disease   . GERD (gastroesophageal reflux disease)   . Cancer   . Sepsis   . E coli infection   . Acute renal failure   . Anxiety   . Pneumonia   . Depression    History of Loss of Consciousness:  {BHH YES OR NO:22294} Seizure History:  {BHH YES OR NO:22294} Cardiac History:  {BHH YES OR NO:22294} Allergies:   Allergies  Allergen Reactions  . Biaxin Other (See Comments)    Cluster migraines   . Clarithromycin Hives  . Cleocin Hives  . Codeine Itching  . Keflex (Cephalexin) Hives    Trouble breathing  . Nitrofurantoin Hives  . Penicillins Hives    Passed out  . Scopolamine Hbr Other (See Comments)    Cluster migraines, impaired vision  . Sulfonamide Derivatives Hives  . Tape Other (See Comments)    Blistering, use paper tape    Current Medications:  Current Outpatient Prescriptions  Medication Sig Dispense Refill  . ALPRAZolam (XANAX) 1 MG tablet Take 1 mg by mouth 4 (four) times daily.       . Artificial Tear (GENTEAL) GEL Apply 2 drops to eye at bedtime.        . butalbital-acetaminophen-caffeine (FIORICET, ESGIC) 50-325-40 MG per tablet Take 1 tablet by mouth 2 (two) times daily as needed. For migraines       . capsicum (ZOSTRIX) 0.075 % topical cream Apply 1 application topically 2 (two) times daily as needed. For neck pain       . cyclobenzaprine (FLEXERIL)  10 MG tablet TAKE 1 TABLET THREE TIMES A DAY  270 tablet  1  . HYDROcodone-acetaminophen (NORCO) 10-325 MG per tablet Take 2 tablets by mouth every 4 (four) hours as needed. For pain.      Marland Kitchen levothyroxine (SYNTHROID, LEVOTHROID) 100 MCG tablet TAKE 1 TABLET DAILY  90 tablet  1  . omeprazole (PRILOSEC) 40 MG capsule Take 1 capsule (40 mg total) by mouth daily.  90 capsule  1  . ondansetron (ZOFRAN) 4 MG tablet Take 4 mg by  mouth every 8 (eight) hours as needed. For nausea        . pravastatin (PRAVACHOL) 80 MG tablet TAKE 1 TABLET EVERY EVENING , DOSE INCREASE  90 tablet  1    Previous Psychotropic Medications:  Medication Dose   ***  ***                     Substance Abuse History in the last 12 months: Substance Age of 1st Use Last Use Amount Specific Type  Nicotine  ***  ***  ***  ***  Alcohol  ***  ***  ***  ***  Cannabis  ***  ***  ***  ***  Opiates  ***  ***  ***  ***  Cocaine  ***  ***  ***  ***  Methamphetamines  ***  ***  ***  ***  LSD  ***  ***  ***  ***  Ecstasy  ***   ***  ***  ***  Benzodiazepines  ***  ***  ***  ***  Caffeine  ***  ***  ***  ***  Inhalants  ***  ***  ***  ***  Others:                          Medical Consequences of Substance Abuse: ***  Legal Consequences of Substance Abuse: ***  Family Consequences of Substance Abuse: ***  Blackouts:  {BHH YES OR NO:22294} DT's:  {BHH YES OR NO:22294} Withdrawal Symptoms:  {BHH YES OR NO:22294} {Withdrawal Symptoms:22677}  Social History: Current Place of Residence: *** Place of Birth: *** Family Members: *** Marital Status:  {Marital Status:22678} Children: ***  Sons: ***  Daughters: *** Relationships: *** Education:  {Education:22679} Educational Problems/Performance: *** Religious Beliefs/Practices: *** History of Abuse: {Desc; abuse:16542} Occupational Experiences; Military History:  {Military History:22680} Legal History: *** Hobbies/Interests: ***  Family History:  No family history on file.  Mental Status Examination/Evaluation: Objective:  Appearance: {Appearance:22683}  Eye Contact::  {BHH EYE CONTACT:22684}  Speech:  {Speech:22685}  Volume:  {Volume (PAA):22686}  Mood:  ***  Affect:  {Affect (PAA):22687}  Thought Process:  {Thought Process (PAA):22688}  Orientation:  {BHH ORIENTATION (PAA):22689}  Thought Content:  {Thought Content:22690}  Suicidal Thoughts:  {ST/HT (PAA):22692}    Homicidal Thoughts:  {ST/HT (PAA):22692}  Judgement:  {Judgement (PAA):22694}  Insight:  {Insight (PAA):22695}  Psychomotor Activity:  {Psychomotor (PAA):22696}  Akathisia:  {BHH YES OR NO:22294}  Handed:  {Handed:22697}  AIMS (if indicated):  ***  Assets:  {Assets (PAA):22698}    Laboratory/X-Ray Psychological Evaluation(s)   ***  ***   Assessment:  {axis diagnosis:3049000}  AXIS I {psych axis 1:31909}  AXIS II {psych axis 2:31910}  AXIS III Past Medical History  Diagnosis Date  . SVT (supraventricular tachycardia)   . Hypercholesterolemia   . Bilateral ovarian cysts   . Perforation bowel   . PE (pulmonary embolism)   . Thyroid disease   . GERD (gastroesophageal  reflux disease)   . Cancer   . Sepsis   . E coli infection   . Acute renal failure   . Anxiety   . Pneumonia   . Depression      AXIS IV {psych axis iv:31915}  AXIS V {psych axis v score:31919}   Treatment Plan/Recommendations:  Plan of Care: ***  Laboratory:  {Laboratory:22682}  Psychotherapy: ***  Medications: ***  Routine PRN Medications:  {BHH YES OR NO:22294}  Consultations: ***  Safety Concerns:  ***  Other:      Anjoli Diemer R, PsyD 11/12/20124:47 PM

## 2011-11-09 NOTE — Progress Notes (Signed)
Patient Discharge Instructions:  Dictated admission note faxed, Date faxed:  11/06/11 D/C instructions faxed, Date faxed:  11/06/11 Med. Rec. Form faxed, Date faxed:  11/06/11  Lolita Cram nita, 11/09/2011, 10:51 AM

## 2011-11-10 ENCOUNTER — Encounter: Payer: Self-pay | Admitting: Family Medicine

## 2011-11-10 ENCOUNTER — Ambulatory Visit (INDEPENDENT_AMBULATORY_CARE_PROVIDER_SITE_OTHER): Payer: Medicare Other | Admitting: Family Medicine

## 2011-11-10 ENCOUNTER — Encounter (HOSPITAL_COMMUNITY): Payer: Self-pay | Admitting: Psychology

## 2011-11-10 ENCOUNTER — Ambulatory Visit: Payer: Medicare Other | Admitting: Family Medicine

## 2011-11-10 VITALS — BP 140/82 | HR 106 | Resp 16 | Ht 64.0 in | Wt 217.0 lb

## 2011-11-10 DIAGNOSIS — N289 Disorder of kidney and ureter, unspecified: Secondary | ICD-10-CM

## 2011-11-10 DIAGNOSIS — E8940 Asymptomatic postprocedural ovarian failure: Secondary | ICD-10-CM

## 2011-11-10 DIAGNOSIS — F329 Major depressive disorder, single episode, unspecified: Secondary | ICD-10-CM

## 2011-11-10 DIAGNOSIS — F3289 Other specified depressive episodes: Secondary | ICD-10-CM

## 2011-11-10 DIAGNOSIS — E669 Obesity, unspecified: Secondary | ICD-10-CM

## 2011-11-10 DIAGNOSIS — E785 Hyperlipidemia, unspecified: Secondary | ICD-10-CM

## 2011-11-10 DIAGNOSIS — E8941 Symptomatic postprocedural ovarian failure: Secondary | ICD-10-CM

## 2011-11-10 DIAGNOSIS — E039 Hypothyroidism, unspecified: Secondary | ICD-10-CM

## 2011-11-10 DIAGNOSIS — F32A Depression, unspecified: Secondary | ICD-10-CM

## 2011-11-10 MED ORDER — DULOXETINE HCL 20 MG PO CPEP: 20.0000 mg | ORAL_CAPSULE | Freq: Every day | ORAL | Status: DC
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
## 2011-11-10 NOTE — Patient Instructions (Addendum)
F/u in 5 weeks  New medication for depression and pain    Pls contact [facility] Singer for medication assistance.  I will get you to a gynaecologist asap   On review following visit, pt needs fasting lipid, and HBa1C and chem 7 before follow up , she will be contacted

## 2011-11-12 ENCOUNTER — Telehealth: Payer: Self-pay | Admitting: Family Medicine

## 2011-11-13 DIAGNOSIS — E8940 Asymptomatic postprocedural ovarian failure: Secondary | ICD-10-CM | POA: Insufficient documentation

## 2011-11-13 HISTORY — DX: Asymptomatic postprocedural ovarian failure: E89.40

## 2011-11-13 MED ORDER — DULOXETINE HCL 30 MG PO CPEP: 30.0000 mg | ORAL_CAPSULE | Freq: Every day | ORAL | Status: DC

## 2011-11-13 NOTE — Telephone Encounter (Signed)
Pt was referred to dr. Jennette Kettle office. She has appt for 11/26/2011 2:00.. Pt is aware of appt

## 2011-11-13 NOTE — Telephone Encounter (Signed)
i spoke with pt , I will not prescribe estrogen, pls refer her to the [facility] center per her request, I explained we will get the soonest appt available for her, she has mentioned Dr Vincente Poli? by name, if possible  I will enter the new referral. Good luck!  Pt also requested cymbalta be sent to local pharmacy , cannot get med through health dept 3 months was called to [place] wo stated trhey would send on to [place], 30mg  one daily

## 2011-11-13 NOTE — Assessment & Plan Note (Signed)
Concerned about hair loss, mood instability, hot flashes. Spoke directly with dr Emelda Fear who agreed to see her within the week. Pt saw dr Despina Hidden yesterday, since did not get estrogen is specifcally asking for another gynae appt in [place]. She had even left me a msg again requesting I prescribe estrogen after I had already made it clear during her OV that I would not, she has a personal h/o breast cancer and  pulmonary embolus
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
## 2011-11-22 NOTE — Assessment & Plan Note (Addendum)
Recent suicide attempt  Requiring hospitalization. Not currently suicidial or homicidal, however, very depressed with mood instability. Needs to be treated by psychiatry. Discharged on no psychotropic meds, will start cymbalta which she has done well with in the past, and this will also help with her chronic back pain. Has seen therapist only since d/c but also needs psychiatric management clearly

## 2011-11-22 NOTE — Assessment & Plan Note (Signed)
Deteriorated. Not specifically addressed at this visit

## 2011-11-22 NOTE — Assessment & Plan Note (Signed)
Uncontrolled when last checked, rept labs due

## 2011-11-22 NOTE — Assessment & Plan Note (Signed)
Controlled, no change in medication  

## 2011-11-22 NOTE — Assessment & Plan Note (Signed)
resolved 

## 2011-11-23 ENCOUNTER — Ambulatory Visit (HOSPITAL_COMMUNITY): Payer: Medicare Other | Admitting: Psychology

## 2011-11-27 ENCOUNTER — Ambulatory Visit (INDEPENDENT_AMBULATORY_CARE_PROVIDER_SITE_OTHER): Payer: Medicare Other | Admitting: Psychology

## 2011-11-27 DIAGNOSIS — F3289 Other specified depressive episodes: Secondary | ICD-10-CM

## 2011-11-27 DIAGNOSIS — F329 Major depressive disorder, single episode, unspecified: Secondary | ICD-10-CM

## 2011-11-27 DIAGNOSIS — F32A Depression, unspecified: Secondary | ICD-10-CM

## 2011-11-27 DIAGNOSIS — F411 Generalized anxiety disorder: Secondary | ICD-10-CM

## 2011-11-27 DIAGNOSIS — F419 Anxiety disorder, unspecified: Secondary | ICD-10-CM

## 2011-11-27 DIAGNOSIS — F0630 Mood disorder due to known physiological condition, unspecified: Secondary | ICD-10-CM

## 2011-11-28 ENCOUNTER — Other Ambulatory Visit: Payer: Self-pay | Admitting: Family Medicine

## 2011-11-30 ENCOUNTER — Encounter (HOSPITAL_COMMUNITY): Payer: Self-pay | Admitting: Psychology

## 2011-11-30 NOTE — Progress Notes (Signed)
Patient:  [name]   DOB: 1961/05/07  MR Number: [id number]  Location: [facility]- [address] [place] [address] Dept: [phone]  Start: 11:30 AM End: 12:30 PM  Provider/Observer:     Hershal Coria PSYD  Chief Complaint:      Chief Complaint  Patient presents with  . Depression  . Anxiety  . Trauma    Reason For Service:     The patient was referred for followup after discharge from the inpatient unit of the behavioral health unit at [place]. The patient was taken to the emergency department on 11/02/2011. She presented with an intentional drug overdose. She was accompanied by her daughter who explained that she was given a text message from her mother complaining of feeling suicidal and wanting to end her life. [name] daughter states that the patient took Klonopin, trazodone, hydrocodone, and placed a fentanyl patch on her arm. Several other prescription medicine she took were more than 50 years old. The patient was able to knowledge taking "several" of these medications. [name] daughter reports that she sent the police looking for her mother do to a text message she had received. Between 1 and 2 PM the police and found the patient in a park and they reported that she was awake, alert, but became more sleepy and less responsive as time went on. The patient was admitted to the behavioral health unit and admitted on 11/03/2011. 1 suicidal ideation had subsided and she was stabilized the patient was discharged home and referred here for followup care.  Interventions Strategy:  Cognitive/behavioral psychotherapeutic interventions  Participation Level:   Active  Participation Quality:  Appropriate      Behavioral Observation:  Well Groomed, Alert, and Depressed and Tearful.   Current Psychosocial Factors: The patient reports that she is continuing to have a great deal of  difficulty with her fistula bag and that she has little money to help pay for it when it breaks. This is been a regular feature. Her wound is still not healed and she has been consulting with other physicians to try to repair some of the significant damage that was done during her last surgery. The patient continues to be overwhelmed by what she is been going through.  Content of Session:   Review current symptoms and continue to work on therapeutic interventions to help her better cope with all of the traumatic events of her recent past.  Current Status:   The patient has been more depressed recently as she is continuing to deal with and try to cope with the fallout of the recent medical events.  Patient Progress:   Stable  Target Goals:   Target goals primarily at the do with the level of depression including feelings of helplessness and hopelessness, previous suicidal ideation, anxiety and fear that have been develop.  Last Reviewed:   11/27/2011  Goals Addressed Today:    Today we were specifically on issues of her depression and feelings of helplessness and hopelessness and the growing amount or persistent amount of anger she has towards the surgeon that operated on her.  Impression/Diagnosis:   The patient has a prior history of numerous medical issues and the most recent medical issues in July have overwhelmed her after she is adapted to many of the previous medical issues including cancer, bowel surgery, and the development of narcotic drug dependence. Right now, the patient is facing an overwhelming number  of medical issues most of which she attributes to a surgical procedure that not only went very poorly and had an enormously bad outcome, but is one that the patient also feels was unwarranted.  Diagnosis:    Axis I:  1. Depressive disorder   2. Anxiety   3. Mood disorder due to a general medical condition         Axis II: No diagnosis

## 2011-12-09 ENCOUNTER — Encounter: Payer: Self-pay | Admitting: Family Medicine

## 2011-12-15 ENCOUNTER — Ambulatory Visit (INDEPENDENT_AMBULATORY_CARE_PROVIDER_SITE_OTHER): Payer: Medicare Other | Admitting: Family Medicine

## 2011-12-15 ENCOUNTER — Ambulatory Visit (INDEPENDENT_AMBULATORY_CARE_PROVIDER_SITE_OTHER): Payer: Medicare Other | Admitting: Psychology

## 2011-12-15 ENCOUNTER — Other Ambulatory Visit: Payer: Self-pay | Admitting: Family Medicine

## 2011-12-15 ENCOUNTER — Encounter: Payer: Self-pay | Admitting: Family Medicine

## 2011-12-15 ENCOUNTER — Telehealth: Payer: Self-pay | Admitting: Family Medicine

## 2011-12-15 VITALS — BP 118/80 | HR 103 | Resp 16 | Ht 64.0 in | Wt 218.8 lb

## 2011-12-15 DIAGNOSIS — F3289 Other specified depressive episodes: Secondary | ICD-10-CM

## 2011-12-15 DIAGNOSIS — G4700 Insomnia, unspecified: Secondary | ICD-10-CM | POA: Insufficient documentation

## 2011-12-15 DIAGNOSIS — D509 Iron deficiency anemia, unspecified: Secondary | ICD-10-CM

## 2011-12-15 DIAGNOSIS — L98499 Non-pressure chronic ulcer of skin of other sites with unspecified severity: Secondary | ICD-10-CM

## 2011-12-15 DIAGNOSIS — E785 Hyperlipidemia, unspecified: Secondary | ICD-10-CM

## 2011-12-15 DIAGNOSIS — F329 Major depressive disorder, single episode, unspecified: Secondary | ICD-10-CM

## 2011-12-15 DIAGNOSIS — R7301 Impaired fasting glucose: Secondary | ICD-10-CM

## 2011-12-15 DIAGNOSIS — L309 Dermatitis, unspecified: Secondary | ICD-10-CM

## 2011-12-15 DIAGNOSIS — IMO0002 Reserved for concepts with insufficient information to code with codable children: Secondary | ICD-10-CM

## 2011-12-15 DIAGNOSIS — E039 Hypothyroidism, unspecified: Secondary | ICD-10-CM

## 2011-12-15 DIAGNOSIS — E8941 Symptomatic postprocedural ovarian failure: Secondary | ICD-10-CM

## 2011-12-15 DIAGNOSIS — F0630 Mood disorder due to known physiological condition, unspecified: Secondary | ICD-10-CM

## 2011-12-15 DIAGNOSIS — E669 Obesity, unspecified: Secondary | ICD-10-CM

## 2011-12-15 DIAGNOSIS — F32A Depression, unspecified: Secondary | ICD-10-CM

## 2011-12-15 DIAGNOSIS — L259 Unspecified contact dermatitis, unspecified cause: Secondary | ICD-10-CM

## 2011-12-15 DIAGNOSIS — E8940 Asymptomatic postprocedural ovarian failure: Secondary | ICD-10-CM

## 2011-12-15 DIAGNOSIS — F411 Generalized anxiety disorder: Secondary | ICD-10-CM

## 2011-12-15 DIAGNOSIS — F419 Anxiety disorder, unspecified: Secondary | ICD-10-CM

## 2011-12-15 HISTORY — DX: Insomnia, unspecified: G47.00

## 2011-12-15 HISTORY — DX: Reserved for concepts with insufficient information to code with codable children: IMO0002

## 2011-12-15 HISTORY — DX: Dermatitis, unspecified: L30.9

## 2011-12-15 MED ORDER — MUPIROCIN CALCIUM 2 % EX CREA: TOPICAL_CREAM | CUTANEOUS | Status: DC

## 2011-12-15 MED ORDER — TRAZODONE HCL 50 MG PO TABS: 50.0000 mg | ORAL_TABLET | Freq: Every day | ORAL | Status: DC

## 2011-12-15 MED ORDER — OMEPRAZOLE 40 MG PO CPDR: 40.0000 mg | DELAYED_RELEASE_CAPSULE | Freq: Every day | ORAL | Status: DC

## 2011-12-15 NOTE — Progress Notes (Signed)
Subjective:     Patient ID: [name], female   DOB: 11-Feb-1961, 50 y.o.   MRN: [id number]  HPI Pt in for f/u and reports that she is doing better mentally on the cymbalta 30mg  dose. She is much calmer at this visit.Sleep is poor nightmares every night, same dream which involves being strapped down and surgery. She denies being suicidal or homicidal.She is seeing psychology regularly. She reports that her wound is slowly healing , she had an episode of rectal bleeding was in [facility] about this.She has little to no bowel movements Concernebd about non pruritic rash on the legs  states  This started  since her surgery at [facility], non blanchng , and no drainage.  Had spontaneous rupture of lesion on left breast , watery fluid, scabs have reformed 3 times, getting smaller, never purulent. She has been cleaning with hibiclens and using neosporin, states her daughter stated she is "sure this is MRSA"   Review of Systems See HPI Denies recent fever or chills. Denies sinus pressure, nasal congestion, ear pain or sore throat. Denies chest congestion, productive cough or wheezing. Denies chest pains, palpitations and leg swelling Denies nausea, vomiting or diarrhea .States she has fistulograms upcoming at [facility] to follow her wound  Denies dysuria, frequency, hesitancy or incontinence. Denies joint pain, swelling and limitation in mobility. Denies headaches, seizures, numbness, or tingling. Reports marked improvement in depression and anxiety, c/o insomnia         Objective:   Physical Exam Patient alert and oriented and in no cardiopulmonary distress.  HEENT: No facial asymmetry, EOMI, no sinus tenderness,  oropharynx pink and moist.  Neck supple no adenopathy.  Chest: Clear to auscultation bilaterally.  CVS: S1, S2 no murmurs, no S3.  ABD: Soft non tender.enterostomy functioning , stool in bag  Ext: No edema  MS: Adequate ROM spine, shoulders, hips and knees.  Skin: Intact,  scab on lateral aspect of left breast, no surrounding erythema or warmth Psych: Good eye contact, normal affect. Memory intact not anxious or depressed appearing.  CNS: CN 2-12 intact, power, tone and sensation normal throughout.     Assessment:        Plan:

## 2011-12-15 NOTE — Patient Instructions (Signed)
F/u end February  I am happy that you are doing better  You need labs by Dec 31 please.   You are referred to dermatology about the rash.  You will get information on menopause.  Trazodone is prescribed to help with sleep.  Bactroban is to be used sparingly around the blister on your left breast.Please ask the surgeon at [facility] to take a look at it when he sees you next. If it worsens before please call so I can refer you to a surgeon

## 2011-12-16 ENCOUNTER — Other Ambulatory Visit: Payer: Self-pay

## 2011-12-16 ENCOUNTER — Other Ambulatory Visit: Payer: Self-pay | Admitting: Family Medicine

## 2011-12-16 LAB — CMP AND LIVER
Albumin: 4.4 g/dL (ref 3.5–5.2)
Alkaline Phosphatase: 94 U/L (ref 39–117)
Bilirubin, Direct: <0.1 mg/dL (ref 0.0–0.3)
CO2: 22 mEq/L (ref 19–32)
Glucose, Bld: 89 mg/dL (ref 70–99)
Potassium: 3.8 mEq/L (ref 3.5–5.3)
Sodium: 136 mEq/L (ref 135–145)
Total Protein: 7.2 g/dL (ref 6.0–8.3)

## 2011-12-16 LAB — LIPID PANEL
HDL: 38 mg/dL — ABNORMAL LOW (ref >39)
Total CHOL/HDL Ratio: 6.4 Ratio

## 2011-12-16 LAB — HEMOGLOBIN A1C
Hgb A1c MFr Bld: 5.7 % — ABNORMAL HIGH (ref <5.7)
Mean Plasma Glucose: 117 mg/dL — ABNORMAL HIGH (ref <117)

## 2011-12-16 LAB — TSH: TSH: 3.482 u[IU]/mL (ref 0.350–4.500)

## 2011-12-16 MED ORDER — CHOLINE FENOFIBRATE 135 MG PO CPDR: 135.0000 mg | DELAYED_RELEASE_CAPSULE | Freq: Every day | ORAL | Status: DC

## 2011-12-16 NOTE — Telephone Encounter (Signed)
The new address was on the rx and I also faxed on a fax cover sheet to note the address change to [facility]

## 2011-12-16 NOTE — Assessment & Plan Note (Signed)
Non blanching erythematous rash, reportedly post op will get derm opinion

## 2011-12-16 NOTE — Assessment & Plan Note (Signed)
Sleep hygiene reviewed, and trazodone added

## 2011-12-16 NOTE — Assessment & Plan Note (Signed)
Uncontrolled with marked elevation in triglycerides, pt needs to lower fat intake as well as additional medication, will consider adding trilipix

## 2011-12-16 NOTE — Assessment & Plan Note (Signed)
Denies hot flashes , and mood swings  have improved on cymbalta

## 2011-12-16 NOTE — Assessment & Plan Note (Signed)
Low carb diet encouraged, HBA1C has increased

## 2011-12-16 NOTE — Assessment & Plan Note (Signed)
Improved, no med change. Pt to add trazodone to assist with sleep. She is to continue therapy

## 2011-12-16 NOTE — Assessment & Plan Note (Signed)
Recurrent left breast ulcer, pt to use bactroban and if does not resolve will need surgical eval

## 2011-12-16 NOTE — Assessment & Plan Note (Signed)
Deteriorated. Patient re-educated about  the importance of commitment to a  minimum of 150 minutes of exercise per week. The importance of healthy food choices with portion control discussed. Encouraged to start a food diary, count calories and to consider  joining a support group. Sample diet sheets offered. Goals set by the patient for the next several months.    

## 2011-12-17 ENCOUNTER — Telehealth: Payer: Self-pay

## 2011-12-17 ENCOUNTER — Other Ambulatory Visit: Payer: Self-pay | Admitting: Family Medicine

## 2011-12-17 MED ORDER — FENOFIBRATE 145 MG PO TABS: 145.0000 mg | ORAL_TABLET | Freq: Every day | ORAL | Status: DC

## 2011-12-17 NOTE — Telephone Encounter (Signed)
Generic fenofibrate , which is the same drug has been entered historically you may need to also spk with the pharmacist before faxing , if the same generic tricor is covered in a lower strength and not the strength I entered ok to change to that and just enter in a follow up note to this pls

## 2011-12-17 NOTE — Telephone Encounter (Signed)
Sent in med per Dr Lodema Hong

## 2011-12-17 NOTE — Telephone Encounter (Signed)
Trilipix isn't covered. Can you change to something else, generic please. [facility] [place]

## 2011-12-17 NOTE — Telephone Encounter (Signed)
Will change to fenofibrate

## 2011-12-18 NOTE — Progress Notes (Signed)
Patient:  [name]   DOB: Jan 04, 1961  MR Number: [id number]  Location: [facility]- [address] [place] [address] Dept: [phone]  Start: 11:30 AM End: 12:30 PM  Provider/Observer:     Hershal Coria PSYD  Chief Complaint:      Chief Complaint  Patient presents with  . Stress  . Anxiety  . Depression  . Trauma  . Agitation    Reason For Service:     The patient was referred for followup after discharge from the inpatient unit of the behavioral health unit at [place]. The patient was taken to the emergency department on 11/02/2011. She presented with an intentional drug overdose. She was accompanied by her daughter who explained that she was given a text message from her mother complaining of feeling suicidal and wanting to end her life. [name] daughter states that the patient took Klonopin, trazodone, hydrocodone, and placed a fentanyl patch on her arm. Several other prescription medicine she took were more than 50 years old. The patient was able to knowledge taking "several" of these medications. [name] daughter reports that she sent the police looking for her mother do to a text message she had received. Between 1 and 2 PM the police and found the patient in a park and they reported that she was awake, alert, but became more sleepy and less responsive as time went on. The patient was admitted to the behavioral health unit and admitted on 11/03/2011. 1 suicidal ideation had subsided and she was stabilized the patient was discharged home and referred here for followup care.  Interventions Strategy:  Cognitive/behavioral psychotherapeutic interventions  Participation Level:   Active  Participation Quality:  Appropriate      Behavioral Observation:  Well Groomed, Alert, and Depressed and Tearful.   Current Psychosocial Factors: The patient continues to have difficulty  with her fistula bag and the complications of her medical issues with the holiday season has been continued to be quite difficult for her.  Content of Session:   Review current symptoms and continue to work on therapeutic interventions to help her better cope with all of the traumatic events of her recent past.  Current Status:   [name] depressive symptoms seemed a little bit better this visit than the previous visit. She was not as completely overwhelmed by her situation but this is clearly a very difficult time for her and depression continues to be significant.  Patient Progress:   Stable  Target Goals:   Target goals primarily at the do with the level of depression including feelings of helplessness and hopelessness, previous suicidal ideation, anxiety and fear that have been develop.  Last Reviewed:   12/15/2011  Goals Addressed Today:    Would continue to work on issues related to her depression and feelings of being overwhelmed with all of the numerous medical complications and difficulties she is dealing with.  Impression/Diagnosis:   The patient has a prior history of numerous medical issues and the most recent medical issues in July have overwhelmed her after she is adapted to many of the previous medical issues including cancer, bowel surgery, and the development of narcotic drug dependence. Right now, the patient is facing an overwhelming number of medical issues most of which she attributes to a surgical procedure that not only went very poorly and had an enormously bad outcome, but is one that the patient also feels was unwarranted.  Diagnosis:  Axis I:  No diagnosis found.      Axis II: No diagnosis

## 2011-12-24 ENCOUNTER — Other Ambulatory Visit: Payer: Self-pay

## 2011-12-24 DIAGNOSIS — F329 Major depressive disorder, single episode, unspecified: Secondary | ICD-10-CM

## 2011-12-24 DIAGNOSIS — F3289 Other specified depressive episodes: Secondary | ICD-10-CM

## 2011-12-24 MED ORDER — DULOXETINE HCL 30 MG PO CPEP: 30.0000 mg | ORAL_CAPSULE | Freq: Every day | ORAL | Status: DC

## 2011-12-25 ENCOUNTER — Telehealth: Payer: Self-pay

## 2011-12-25 NOTE — Telephone Encounter (Signed)
States bad cough and feels like she has the flu. Advised UC. States she will if she doesn't start to feel better

## 2011-12-31 ENCOUNTER — Other Ambulatory Visit: Payer: Self-pay

## 2011-12-31 DIAGNOSIS — F3289 Other specified depressive episodes: Secondary | ICD-10-CM

## 2011-12-31 DIAGNOSIS — F329 Major depressive disorder, single episode, unspecified: Secondary | ICD-10-CM

## 2011-12-31 MED ORDER — DULOXETINE HCL 30 MG PO CPEP: 30.0000 mg | ORAL_CAPSULE | Freq: Every day | ORAL | Status: DC

## 2012-01-05 ENCOUNTER — Telehealth: Payer: Self-pay | Admitting: Family Medicine

## 2012-01-06 NOTE — Telephone Encounter (Signed)
Already been taken care of

## 2012-01-06 NOTE — Telephone Encounter (Signed)
Do you know what form they are talking about? I haven't seen any. Need it back by today

## 2012-01-06 NOTE — Telephone Encounter (Signed)
Already approved

## 2012-01-19 ENCOUNTER — Ambulatory Visit (HOSPITAL_COMMUNITY): Payer: Medicare Other | Admitting: Psychology

## 2012-01-20 ENCOUNTER — Telehealth: Payer: Self-pay

## 2012-01-20 NOTE — Telephone Encounter (Signed)
Her dad got 3 months of generic lipitor and he can't take it. Wants to know if she could. Would it interact with any of her other meds?

## 2012-01-21 NOTE — Telephone Encounter (Signed)
She has pravachol 80 mg , do not take the lipitor with this, when she is finished the pravachol may be able to take the lipitor in it's place

## 2012-01-21 NOTE — Telephone Encounter (Signed)
Patient aware.

## 2012-01-26 ENCOUNTER — Telehealth: Payer: Self-pay | Admitting: Family Medicine

## 2012-01-27 MED ORDER — LEVOTHYROXINE SODIUM 100 MCG PO TABS: ORAL_TABLET | ORAL | Status: DC

## 2012-01-27 NOTE — Telephone Encounter (Signed)
Sent in

## 2012-02-01 NOTE — Discharge Summary (Signed)
Physician Discharge Summary Note  Patient:  [name] is an 51 y.o., female MRN:  [id number] DOB:  19-Jan-1961 Patient phone:  [phone] (home)  Patient address:   [address] [place] [address],   Date of Admission:  11/03/2011 Date of Discharge: 11/05/2011     Axis Diagnosis:   AXIS I:  Adjustment Disorder with underlying depressed mood AXIS II:  No diagnosis AXIS III:   Past Medical History  Diagnosis Date  . SVT (supraventricular tachycardia)   . Hypercholesterolemia   . Bilateral ovarian cysts   . Perforation bowel   . PE (pulmonary embolism)   . Thyroid disease   . GERD (gastroesophageal reflux disease)   . Cancer   . Sepsis   . E coli infection   . Acute renal failure   . Anxiety   . Pneumonia   . Depression    AXIS IV:  Multiple significant medical issues AXIS V:  51-60 moderate symptoms  Level of Care:  OP  Hospital Course:  This was a brief admission for [place] who presented after a polypharmacy overdose, citing stressors of multiple medical problems,the most recent of which was having to deal with a colostomy after several GI surgeries. She was cooperative and pleasant on the unit and participation in group therapy was productive and she wanted to follow-up as an outpatient with further counseling.  Her stay was uneventful.  She received pain medication for comfort and a wound care consult was given for advice in caring for her colostomy.  She was in full contact with reality by the Nov 8th and requesting to follow up as an outpatient with counseling.  Her family was in agreement that she was ready to come home and she felt she could be safe.   Consults:  Wound care Consult for colostomy  Significant Diagnostic Studies:  TSH 3.482  Discharge Vitals:   Blood pressure 119/80, pulse 82, temperature 97.9 F (36.6 C), temperature source Oral, resp. rate 16, height 5\' 4"  (1.626 m), weight 97.523 kg (215 lb).  Mental Status Exam: See Mental Status  Examination and Suicide Risk Assessment completed by Attending Physician prior to discharge.  Discharge destination:  Home  Is patient on multiple antipsychotic therapies at discharge:  No   Has Patient had three or more failed trials of antipsychotic monotherapy by history:  No  Recommended Plan for Multiple Antipsychotic Therapies: N/A  Discharge Orders    Future Appointments: Provider: Department: Dept Phone: Center:   02/15/2012 1:15 PM Syliva Overman, MD [facility] [phone] [facility]     Future Orders Please Complete By Expires   Diet - low sodium heart healthy      Increase activity slowly      Discharge wound care:      Comments:   Use the supplies provided. Follow up with your provider for other instructions as they recommended.     Medication List  As of 02/01/2012  4:11 PM   CONTINUE taking these medications         butalbital-acetaminophen-caffeine 50-325-40 MG per tablet   Commonly known as: FIORICET, ESGIC      capsicum 0.075 % topical cream   Commonly known as: ZOSTRIX      cyclobenzaprine 10 MG tablet   Commonly known as: FLEXERIL   TAKE 1 TABLET THREE TIMES A DAY      GenTeal Gel      HYDROcodone-acetaminophen 10-325 MG per tablet   Commonly known as: NORCO  ondansetron 4 MG tablet   Commonly known as: ZOFRAN      pravastatin 80 MG tablet   Commonly known as: PRAVACHOL   TAKE 1 TABLET EVERY EVENING , DOSE INCREASE         STOP taking these medications         ibuprofen 800 MG tablet      spironolactone 25 MG tablet           Follow-up Information    Follow up with [facility] on 11/09/2011. (Appointment scheduled with Dr. Kieth Brightly at 1:00 pm)    Contact information:   [address] [place], [place] [address] [phone]         Follow-up recommendations:  Activity:  unrestricted Diet:  regular  Comments:  none  Signed: Chloris Marcoux A 02/01/2012, 4:11 PM

## 2012-02-03 ENCOUNTER — Emergency Department (HOSPITAL_COMMUNITY): Payer: Medicare Other

## 2012-02-03 ENCOUNTER — Emergency Department (HOSPITAL_COMMUNITY)
Admission: EM | Admit: 2012-02-03 | Discharge: 2012-02-04 | Disposition: A | Discharge status: Home / Self Care | Payer: Medicare Other | Attending: Emergency Medicine | Admitting: Emergency Medicine

## 2012-02-03 ENCOUNTER — Encounter (HOSPITAL_COMMUNITY): Payer: Self-pay | Admitting: *Deleted

## 2012-02-03 DIAGNOSIS — K219 Gastro-esophageal reflux disease without esophagitis: Secondary | ICD-10-CM | POA: Insufficient documentation

## 2012-02-03 DIAGNOSIS — E079 Disorder of thyroid, unspecified: Secondary | ICD-10-CM | POA: Insufficient documentation

## 2012-02-03 DIAGNOSIS — Z79899 Other long term (current) drug therapy: Secondary | ICD-10-CM | POA: Insufficient documentation

## 2012-02-03 DIAGNOSIS — Z933 Colostomy status: Secondary | ICD-10-CM | POA: Insufficient documentation

## 2012-02-03 DIAGNOSIS — R109 Unspecified abdominal pain: Secondary | ICD-10-CM | POA: Insufficient documentation

## 2012-02-03 DIAGNOSIS — Z86711 Personal history of pulmonary embolism: Secondary | ICD-10-CM | POA: Insufficient documentation

## 2012-02-03 DIAGNOSIS — K567 Ileus, unspecified: Secondary | ICD-10-CM

## 2012-02-03 DIAGNOSIS — F329 Major depressive disorder, single episode, unspecified: Secondary | ICD-10-CM | POA: Insufficient documentation

## 2012-02-03 DIAGNOSIS — F411 Generalized anxiety disorder: Secondary | ICD-10-CM | POA: Insufficient documentation

## 2012-02-03 DIAGNOSIS — F3289 Other specified depressive episodes: Secondary | ICD-10-CM | POA: Insufficient documentation

## 2012-02-03 DIAGNOSIS — E7800 Pure hypercholesterolemia, unspecified: Secondary | ICD-10-CM | POA: Insufficient documentation

## 2012-02-03 DIAGNOSIS — I498 Other specified cardiac arrhythmias: Secondary | ICD-10-CM | POA: Insufficient documentation

## 2012-02-03 LAB — DIFFERENTIAL
Lymphocytes Relative: 33 % (ref 12–46)
Lymphs Abs: 2.2 10*3/uL (ref 0.7–4.0)
Monocytes Relative: 5 % (ref 3–12)
Neutro Abs: 4 10*3/uL (ref 1.7–7.7)
Neutrophils Relative %: 60 % (ref 43–77)

## 2012-02-03 LAB — COMPREHENSIVE METABOLIC PANEL
ALT: 39 U/L — ABNORMAL HIGH (ref 0–35)
Alkaline Phosphatase: 108 U/L (ref 39–117)
BUN: 23 mg/dL (ref 6–23)
CO2: 27 mEq/L (ref 19–32)
Chloride: 102 mEq/L (ref 96–112)
GFR calc Af Amer: 65 mL/min — ABNORMAL LOW (ref >90)
Glucose, Bld: 93 mg/dL (ref 70–99)
Potassium: 3.9 mEq/L (ref 3.5–5.1)
Total Bilirubin: 0.2 mg/dL — ABNORMAL LOW (ref 0.3–1.2)

## 2012-02-03 LAB — CBC
Hemoglobin: 12 g/dL (ref 12.0–15.0)
MCH: 26.9 pg (ref 26.0–34.0)
RBC: 4.46 MIL/uL (ref 3.87–5.11)
WBC: 6.8 10*3/uL (ref 4.0–10.5)

## 2012-02-03 MED ORDER — FENTANYL CITRATE 0.05 MG/ML IJ SOLN
INTRAMUSCULAR | Status: AC
  Administered 2012-02-03: 50 ug
  Filled 2012-02-03: qty 2

## 2012-02-03 MED ORDER — HYDROMORPHONE HCL PF 2 MG/ML IJ SOLN
2.0000 mg | Freq: Once | INTRAMUSCULAR | Status: AC
  Administered 2012-02-03: 2 mg via INTRAVENOUS
  Filled 2012-02-03: qty 1

## 2012-02-03 MED ORDER — ONDANSETRON HCL 4 MG/2ML IJ SOLN
4.0000 mg | Freq: Once | INTRAMUSCULAR | Status: AC
  Administered 2012-02-03: 4 mg via INTRAVENOUS
  Filled 2012-02-03: qty 2

## 2012-02-03 NOTE — ED Notes (Signed)
Pt reports abd surgery 7 mts at [facility], pt reports she has a wound vac with exposed bowel and today pain is very severe

## 2012-02-03 NOTE — ED Provider Notes (Signed)
This chart was scribed for [employer]. Colon Branch, MD by Wallis Mart. The patient was seen in room APA15/APA15 and the patient's care was started at 9:56 PM.   CSN: [id number]  Arrival date & time 02/03/12  1903   First MD Initiated Contact with Patient 02/03/12 2128      Chief Complaint  Patient presents with  . Abdominal Pain    (Consider location/radiation/quality/duration/timing/severity/associated sxs/prior treatment) HPI [name] is a 51 y.o. female who presents to the Emergency Department complaining of sudden onset, persistence of constant,  severe abdominal pain radiating to the back that started tonight after eating steak and onion. Pt is unable to eat due to the pain. Pt notes a marked decreased output to colostomy bag.  Nothing improves the pain. Pt denies any other sx.  Pt reports a perforation of the bowel during surgical removal of her ovaries and fallopian tubes in August of last year at [facility]. Resulting abd surgery (colon surgery, bowel resection) to repair perforation 7 mts ago at [facility], pt reports she has a wound vac with exposed bowel, colostomy, fistula.  Pt had sepsis, e coli after surgery at [facility] [place].  Pt switched hospitals to [facility] and had a barium enema to look for bowel wall leaking yesterday that did not show loose bowels.  Past Medical History  Diagnosis Date  . SVT (supraventricular tachycardia)   . Hypercholesterolemia   . Bilateral ovarian cysts   . Perforation bowel   . PE (pulmonary embolism)   . Thyroid disease   . GERD (gastroesophageal reflux disease)   . Cancer   . Sepsis   . E coli infection   . Acute renal failure   . Anxiety   . Pneumonia   . Depression     Past Surgical History  Procedure Date  . Ablasion   . Vena cava filter placement   . Abdominal hysterectomy   . Hernia repair   . Colon surgery   . Bowel resection     No family history on file.  History  Substance Use Topics  . Smoking status: Never Smoker   .  Smokeless tobacco: Never Used  . Alcohol Use: No    OB History    Grav Para Term Preterm Abortions TAB SAB Ect Mult Living                  Review of Systems  10 Systems reviewed and are negative for acute change except as noted in the HPI.  Allergies  Biaxin; Clarithromycin; Cleocin; Codeine; Keflex; Nitrofurantoin; Penicillins; Scopolamine hbr; Sulfonamide derivatives; and Tape  Home Medications   Current Outpatient Rx  Name Route Sig Dispense Refill  . BUTALBITAL-APAP-CAFFEINE 50-325-40 MG PO TABS Oral Take 1 tablet by mouth 2 (two) times daily as needed. For migraines     . CYCLOBENZAPRINE HCL 10 MG PO TABS  TAKE 1 TABLET THREE TIMES A DAY 270 tablet 1  . DULOXETINE HCL 30 MG PO CPEP Oral Take 1 capsule (30 mg total) by mouth daily. 90 capsule 0  . FENOFIBRATE 145 MG PO TABS Oral Take 1 tablet (145 mg total) by mouth daily. 30 tablet 4  . HYDROCODONE-ACETAMINOPHEN 10-325 MG PO TABS Oral Take 2 tablets by mouth every 4 (four) hours as needed. For pain.    Marland Kitchen LEVOTHYROXINE SODIUM 100 MCG PO TABS  TAKE 1 TABLET DAILY 90 tablet 1  . MUPIROCIN CALCIUM 2 % EX CREA  Apply to affected area two times daily as needed 30  g 1  . OMEPRAZOLE 40 MG PO CPDR Oral Take 1 capsule (40 mg total) by mouth daily. 90 capsule 1  . PRAVASTATIN SODIUM 80 MG PO TABS  TAKE 1 TABLET EVERY EVENING , DOSE INCREASE 90 tablet 1  . XANAX 1 MG PO TABS  TAKE (1) TABLET BY MOUTH (4) TIMES DAILY AS NEEDED. 120 each 2  . CAPSAICIN 0.075 % EX CREA Topical Apply 1 application topically 2 (two) times daily as needed. For neck pain     . ONDANSETRON HCL 4 MG PO TABS Oral Take 4 mg by mouth every 8 (eight) hours as needed. For nausea        BP 112/96  Pulse 102  Temp(Src) 97.7 F (36.5 C) (Oral)  Resp 20  Ht 5\' 4"  (1.626 m)  Wt 214 lb (97.07 kg)  BMI 36.73 kg/m2  SpO2 100%  Physical Exam  Nursing note and vitals reviewed. Constitutional: She is oriented to person, place, and time. She appears well-developed  and well-nourished. She appears distressed.  HENT:  Head: Normocephalic and atraumatic.  Eyes: EOM are normal. Pupils are equal, round, and reactive to light.  Neck: Normal range of motion. Neck supple. No tracheal deviation present.  Cardiovascular: Normal rate.        tachycardia  Pulmonary/Chest: Effort normal and breath sounds normal. No respiratory distress.  Abdominal: Soft. Bowel sounds are normal. She exhibits no distension.       Diffused tenderness across entire upper abdomin  Midline ostomy bag with scant amount of dark brown fecal matter  Musculoskeletal: Normal range of motion. She exhibits no edema.  Neurological: She is alert and oriented to person, place, and time. No sensory deficit.  Skin: Skin is warm and dry.  Psychiatric: She has a normal mood and affect. Her behavior is normal.    ED Course  Procedures (including critical care time) DIAGNOSTIC STUDIES: Oxygen Saturation is 100% on room air, normal by my interpretation.    COORDINATION OF CARE:  10:04: while in room pt began to move fecal matter from bowel to ostomy bag 2340 Patient has had 600 ml fecal output from colostomy and pressure she was feeling is relieved. Pain currently 1-2/10. Results for orders placed during the hospital encounter of 02/03/12  CBC      Component Value Range   WBC 6.8  4.0 - 10.5 (K/uL)   RBC 4.46  3.87 - 5.11 (MIL/uL)   Hemoglobin 12.0  12.0 - 15.0 (g/dL)   HCT 91.4  78.2 - 95.6 (%)   MCV 84.5  78.0 - 100.0 (fL)   MCH 26.9  26.0 - 34.0 (pg)   MCHC 31.8  30.0 - 36.0 (g/dL)   RDW 21.3  08.6 - 57.8 (%)   Platelets 298  150 - 400 (K/uL)  DIFFERENTIAL      Component Value Range   Neutrophils Relative 60  43 - 77 (%)   Neutro Abs 4.0  1.7 - 7.7 (K/uL)   Lymphocytes Relative 33  12 - 46 (%)   Lymphs Abs 2.2  0.7 - 4.0 (K/uL)   Monocytes Relative 5  3 - 12 (%)   Monocytes Absolute 0.4  0.1 - 1.0 (K/uL)   Eosinophils Relative 2  0 - 5 (%)   Eosinophils Absolute 0.2  0.0 - 0.7  (K/uL)   Basophils Relative 0  0 - 1 (%)   Basophils Absolute 0.0  0.0 - 0.1 (K/uL)  COMPREHENSIVE METABOLIC PANEL      Component  Value Range   Sodium 140  135 - 145 (mEq/L)   Potassium 3.9  3.5 - 5.1 (mEq/L)   Chloride 102  96 - 112 (mEq/L)   CO2 27  19 - 32 (mEq/L)   Glucose, Bld 93  70 - 99 (mg/dL)   BUN 23  6 - 23 (mg/dL)   Creatinine, Ser 6.21 (*) 0.50 - 1.10 (mg/dL)   Calcium 30.8  8.4 - 10.5 (mg/dL)   Total Protein 8.1  6.0 - 8.3 (g/dL)   Albumin 4.2  3.5 - 5.2 (g/dL)   AST 40 (*) 0 - 37 (U/L)   ALT 39 (*) 0 - 35 (U/L)   Alkaline Phosphatase 108  39 - 117 (U/L)   Total Bilirubin 0.2 (*) 0.3 - 1.2 (mg/dL)   GFR calc non Af Amer 56 (*) >90 (mL/min)   GFR calc Af Amer 65 (*) >90 (mL/min)   Dg Abd Acute W/chest  02/03/2012  *RADIOLOGY REPORT*  Clinical Data: Abdominal pain  ACUTE ABDOMEN SERIES (ABDOMEN 2 VIEW & CHEST 1 VIEW)  Comparison: 08/29/2011  Findings: Cervical fixation hardware partially seen.  Stable linear scarring or atelectasis in the lung bases.  Heart size normal.  No effusion.  No free air.  IVC filter at the L2-3 level.  Vascular clips in the left   mid and right upper abdomen.  A few gas filled nondilated small bowel loops, with scattered fluid levels on the erect radiograph.  Colon is decompressed.  Probable retained colonic contrast projects over the mid sacrum.  IMPRESSION:  1.  Scattered fluid levels in nondilated small bowel suggesting early ileus. 2.  No free air. 3.  No acute cardiopulmonary disease.  Original Report Authenticated By: Osa Craver, M.D.     MDM  Patient with complicated past medical history of perforated bowel during oophorectomy/salpinectomy resulting in colostomy and fistula formation. Had barium enema done yesterday at [facility] and developed abdominal pain tonight after eating steak and onion. Colostomy with poor draining until arrival in the ER when it began normal output. Xray with evidence of early ileus. Patient and husband informed  of clinical course, understand medical decision-making process, and agree with plan.Pt feels improved after observation and/or treatment in ED.Pt stable in ED with no significant deterioration in condition. The patient appears reasonably screened and/or stabilized for discharge and I doubt any other medical condition or other [facility] requiring further screening, evaluation, or treatment in the ED at this time prior to discharge.  I personally performed the services described in this documentation, which was scribed in my presence. The recorded information has been reviewed and considered.      MDM Reviewed: previous chart, nursing note and vitals Reviewed previous: labs, x-ray and CT scan Interpretation: labs and x-ray Total time providing critical care: 40.     Nicoletta Dress. Colon Branch, MD 02/04/12 6578

## 2012-02-04 MED ORDER — DIPHENHYDRAMINE HCL 50 MG/ML IJ SOLN
25.0000 mg | Freq: Once | INTRAMUSCULAR | Status: AC
  Administered 2012-02-04: 25 mg via INTRAVENOUS
  Filled 2012-02-04: qty 1

## 2012-02-09 ENCOUNTER — Telehealth: Payer: Self-pay | Admitting: Family Medicine

## 2012-02-09 NOTE — Telephone Encounter (Signed)
After checking on the results, the liver enzymes were just slightly elevated, not very high

## 2012-02-09 NOTE — Telephone Encounter (Signed)
Message written

## 2012-02-09 NOTE — Telephone Encounter (Signed)
Pt does not want to hold med that long stating that she has surgery scheduled for March 1st.  She has an appointment her on 2/18 and will discuss further then.

## 2012-02-09 NOTE — Telephone Encounter (Signed)
noted 

## 2012-02-09 NOTE — Telephone Encounter (Signed)
pls advise her AST  Was 40, nl is 37, and alt was 39 normal is 35, hydration, creatinine was 1.12 nl is 1.10 so none of these levels were "very high" but all sightly elevated likely because she had the blockage.   I advise hold the cholesterol medicine for another 4 weeks, increase water intake, she needs a lab request for chem 7 and hepatic panel non fasting in 4 weeks.  Hope she is feeling better, needs oV here in 4 weeks, get lab work about 2 days before visit, which she needs to sched if she has no appt.  If she feels she needs to be seen sooner ask for sooner appt, I am here!

## 2012-02-09 NOTE — Telephone Encounter (Signed)
Had to go to ER wed and Dr Colon Branch saw her- had bowel blockage. Did labs and told her to stop cholesterol meds. Liver enzymes were very high. Creatinine level was up also. Was told to call here for further instructions

## 2012-02-12 ENCOUNTER — Other Ambulatory Visit: Payer: Self-pay

## 2012-02-12 ENCOUNTER — Telehealth: Payer: Self-pay | Admitting: Family Medicine

## 2012-02-12 MED ORDER — BUTALBITAL-APAP-CAFFEINE 50-325-40 MG PO TABS: 1.0000 | ORAL_TABLET | Freq: Two times a day (BID) | ORAL | Status: DC | PRN

## 2012-02-12 NOTE — Telephone Encounter (Signed)
Do you want to refill? 

## 2012-02-12 NOTE — Telephone Encounter (Signed)
I spoke with pt. meds refilled

## 2012-02-15 ENCOUNTER — Encounter: Payer: Self-pay | Admitting: Family Medicine

## 2012-02-15 ENCOUNTER — Ambulatory Visit (INDEPENDENT_AMBULATORY_CARE_PROVIDER_SITE_OTHER): Payer: Medicare Other | Admitting: Family Medicine

## 2012-02-15 DIAGNOSIS — L988 Other specified disorders of the skin and subcutaneous tissue: Secondary | ICD-10-CM

## 2012-02-15 DIAGNOSIS — D509 Iron deficiency anemia, unspecified: Secondary | ICD-10-CM

## 2012-02-15 DIAGNOSIS — F411 Generalized anxiety disorder: Secondary | ICD-10-CM

## 2012-02-15 DIAGNOSIS — R7301 Impaired fasting glucose: Secondary | ICD-10-CM

## 2012-02-15 DIAGNOSIS — E669 Obesity, unspecified: Secondary | ICD-10-CM

## 2012-02-15 DIAGNOSIS — L089 Local infection of the skin and subcutaneous tissue, unspecified: Secondary | ICD-10-CM

## 2012-02-15 DIAGNOSIS — E039 Hypothyroidism, unspecified: Secondary | ICD-10-CM

## 2012-02-15 DIAGNOSIS — E785 Hyperlipidemia, unspecified: Secondary | ICD-10-CM

## 2012-02-15 NOTE — Patient Instructions (Addendum)
F/u in 3 month  Labs today lipid, chem 7, hepatic, cbc, vit D  All the best with upcoming surgery.  You will be referred for a mammogram at the breast center in April or after, call us back for this.  You will be referred to dr Dietrich Pates if your lipids are still very high  pls let me know when you want to chose a new therapist

## 2012-02-15 NOTE — Progress Notes (Signed)
  Subjective:    Patient ID: [name], female    DOB: 04/04/1961, 51 y.o.   MRN: [id number]  HPI Pt was at [facility] ED on 02/06 with what she felt was intestinal obstruction, but fortunately while in the eD , she had spontaneous expulsion of stool without needing further treatment.  Vomited yellow bile once, but had nausea.  She has surgery on March 11 at [facility] to close her fistula, she has appt with plastic and urogynae at [facility] this week, had vaginal trauma in surgery, and she voids incompletely with dribbling. Was told to hold lipid meds and also that her triglycerides were very high in the Ed and wants to review her labs. She does have understandable anxiety and concern over upcoming surgery , she is a high surgical risk and the surgery is very complicated, without guaranteed success, however all in all, she has calm and confidence in this regard. No longer seeing the psychologist se was initially referred to following drug o/d, and if she decides to resume therapy, which I think would be in her best interest, States she wants a new therapist. Had question about adding klonopin which she found from past use to her current xanax,, advised against this and will not prescribe     Review of Systems See HPI Denies recent fever or chills. Denies sinus pressure, nasal congestion, ear pain or sore throat. Denies chest congestion, productive cough or wheezing. Denies chest pains, palpitations and leg swelling Chronic abdominal discomfort with nausea Denies dysuria, frequency, hesitancy or incontinence. Denies joint pain, swelling and limitation in mobility. Denies headaches, seizures, numbness, or tingling.  Denies skin break down or rash.        Objective:   Physical Exam Patient alert and oriented and in no cardiopulmonary distress.  HEENT: No facial asymmetry, EOMI, no sinus tenderness,  oropharynx pink and moist.  Neck supple no adenopathy.  Chest: Clear to auscultation  bilaterally.  CVS: S1, S2 no murmurs, no S3.  ABD: Soft .  Ext: No edema  MS: Adequate ROM spine, shoulders, hips and knees.  Skin: Intact, no ulcerations or rash noted.  Psych: Good eye contact, normal affect. Memory intact mildly anxious not depressed appearing.  CNS: CN 2-12 intact, power, tone and sensation normal throughout.        Assessment & Plan:

## 2012-02-16 LAB — CBC
HCT: 37.8 % (ref 36.0–46.0)
MCHC: 31.7 g/dL (ref 30.0–36.0)
MCV: 85.5 fL (ref 78.0–100.0)
Platelets: 332 10*3/uL (ref 150–400)
RDW: 14.9 % (ref 11.5–15.5)
WBC: 7.6 10*3/uL (ref 4.0–10.5)

## 2012-02-16 LAB — HEPATIC FUNCTION PANEL
ALT: 32 U/L (ref 0–35)
AST: 31 U/L (ref 0–37)
Albumin: 4.3 g/dL (ref 3.5–5.2)
Alkaline Phosphatase: 140 U/L — ABNORMAL HIGH (ref 39–117)
Bilirubin, Direct: <0.1 mg/dL (ref 0.0–0.3)

## 2012-02-16 LAB — BASIC METABOLIC PANEL
BUN: 20 mg/dL (ref 6–23)
Chloride: 104 mEq/L (ref 96–112)
Glucose, Bld: 98 mg/dL (ref 70–99)
Potassium: 4.7 mEq/L (ref 3.5–5.3)
Sodium: 139 mEq/L (ref 135–145)

## 2012-02-16 LAB — LIPID PANEL: Cholesterol: 249 mg/dL — ABNORMAL HIGH (ref 0–200)

## 2012-02-18 DIAGNOSIS — N393 Stress incontinence (female) (male): Secondary | ICD-10-CM | POA: Insufficient documentation

## 2012-03-04 NOTE — Assessment & Plan Note (Signed)
Improved, despite significant health challenges

## 2012-03-04 NOTE — Assessment & Plan Note (Signed)
Pt to have surgical repair at [facility] in the near future

## 2012-03-04 NOTE — Assessment & Plan Note (Signed)
Controlled no medication change. 

## 2012-03-04 NOTE — Assessment & Plan Note (Signed)
Deteriorated. Patient re-educated about  the importance of commitment to a  minimum of 150 minutes of exercise per week. The importance of healthy food choices with portion control discussed. Encouraged to start a food diary, count calories and to consider  joining a support group. Sample diet sheets offered. Goals set by the patient for the next several months.    

## 2012-03-04 NOTE — Assessment & Plan Note (Signed)
Improved triglycerides, add tricor

## 2012-06-20 ENCOUNTER — Other Ambulatory Visit: Payer: Self-pay | Admitting: Family Medicine

## 2012-06-20 ENCOUNTER — Telehealth: Payer: Self-pay | Admitting: Family Medicine

## 2012-06-20 NOTE — Telephone Encounter (Signed)
When she was in the hospital she was changed from omeprazole to protonix and it has really been helping her but she is almost out and needs a 3 month supply protonix sent into [facility] asap please

## 2012-06-20 NOTE — Telephone Encounter (Signed)
It was protonix 40 and she got it from the hospital. Hasn't filled at local pharmacy

## 2012-06-20 NOTE — Telephone Encounter (Signed)
Not sure of dose of protonix 20 mg or 40 mg pls find out where she has been filling so exact dose and directions can be verified  Also pls ask her to make appt to come in in the next 2 to 3 months. Sounds as though she is doing better and I am happy about that let her know

## 2012-06-20 NOTE — Telephone Encounter (Signed)
This has been entered historically pls fax and let her know

## 2012-06-21 MED ORDER — PANTOPRAZOLE SODIUM 40 MG PO TBEC: 40.0000 mg | DELAYED_RELEASE_TABLET | Freq: Every day | ORAL | Status: DC

## 2012-06-21 NOTE — Telephone Encounter (Signed)
Sent in and pt aware 

## 2012-06-22 ENCOUNTER — Ambulatory Visit (HOSPITAL_COMMUNITY)
Admission: RE | Admit: 2012-06-22 | Discharge: 2012-06-22 | Disposition: A | Discharge status: Home / Self Care | Payer: Medicare Other | Source: Ambulatory Visit | Attending: Family Medicine | Admitting: Family Medicine

## 2012-06-22 DIAGNOSIS — Z452 Encounter for adjustment and management of vascular access device: Secondary | ICD-10-CM | POA: Insufficient documentation

## 2012-06-22 NOTE — Progress Notes (Signed)
Patient came in to have PICC removed. Received faxed order from Dr. Jethro Bolus in [place]. PICC removed per hospital protocol. Petroleum gauze and pressure dressing applied. Patient instructed to leave dressing in place for 24hr. Removed PICC measured 43cm coinciding with inserted PICC information.

## 2012-07-04 DIAGNOSIS — K56600 Partial intestinal obstruction, unspecified as to cause: Secondary | ICD-10-CM

## 2012-07-04 HISTORY — DX: Partial intestinal obstruction, unspecified as to cause: K56.600

## 2012-07-29 ENCOUNTER — Telehealth: Payer: Self-pay

## 2012-07-29 MED ORDER — CYCLOBENZAPRINE HCL 10 MG PO TABS: ORAL_TABLET | ORAL | Status: DC

## 2012-07-29 NOTE — Telephone Encounter (Signed)
[facility] requesting prilosec 40 1 qd per pt. Not on medlist. Ok to refill?

## 2012-07-29 NOTE — Telephone Encounter (Signed)
You will need to call pt and verify she is actually taking this medication before you fill. She also needs to sched appt and bring in her meds in th next 1 to 2 months

## 2012-08-01 ENCOUNTER — Other Ambulatory Visit: Payer: Self-pay

## 2012-08-01 MED ORDER — OMEPRAZOLE 40 MG PO CPDR: 40.0000 mg | DELAYED_RELEASE_CAPSULE | Freq: Every day | ORAL | Status: DC

## 2012-08-01 NOTE — Telephone Encounter (Signed)
Yes, pt is taking generic prilosec. Sent in refill

## 2012-08-01 NOTE — Telephone Encounter (Signed)
Called patient and left message for her to call back.

## 2012-08-03 DIAGNOSIS — K439 Ventral hernia without obstruction or gangrene: Secondary | ICD-10-CM | POA: Insufficient documentation

## 2012-08-03 DIAGNOSIS — K632 Fistula of intestine: Secondary | ICD-10-CM | POA: Insufficient documentation

## 2012-09-08 ENCOUNTER — Other Ambulatory Visit: Payer: Self-pay | Admitting: Family Medicine

## 2012-09-13 ENCOUNTER — Other Ambulatory Visit: Payer: Self-pay | Admitting: Family Medicine

## 2012-09-13 DIAGNOSIS — Z1231 Encounter for screening mammogram for malignant neoplasm of breast: Secondary | ICD-10-CM

## 2012-09-15 ENCOUNTER — Other Ambulatory Visit: Payer: Self-pay | Admitting: Family Medicine

## 2012-09-15 ENCOUNTER — Ambulatory Visit (INDEPENDENT_AMBULATORY_CARE_PROVIDER_SITE_OTHER): Payer: Medicare Other | Admitting: Family Medicine

## 2012-09-15 ENCOUNTER — Encounter: Payer: Self-pay | Admitting: Family Medicine

## 2012-09-15 VITALS — BP 120/80 | HR 70 | Resp 18 | Ht 64.0 in | Wt 205.1 lb

## 2012-09-15 DIAGNOSIS — R5381 Other malaise: Secondary | ICD-10-CM

## 2012-09-15 DIAGNOSIS — D509 Iron deficiency anemia, unspecified: Secondary | ICD-10-CM

## 2012-09-15 DIAGNOSIS — R7301 Impaired fasting glucose: Secondary | ICD-10-CM

## 2012-09-15 DIAGNOSIS — E538 Deficiency of other specified B group vitamins: Secondary | ICD-10-CM

## 2012-09-15 DIAGNOSIS — F411 Generalized anxiety disorder: Secondary | ICD-10-CM

## 2012-09-15 DIAGNOSIS — L089 Local infection of the skin and subcutaneous tissue, unspecified: Secondary | ICD-10-CM

## 2012-09-15 DIAGNOSIS — Z23 Encounter for immunization: Secondary | ICD-10-CM

## 2012-09-15 DIAGNOSIS — M7989 Other specified soft tissue disorders: Secondary | ICD-10-CM

## 2012-09-15 DIAGNOSIS — F329 Major depressive disorder, single episode, unspecified: Secondary | ICD-10-CM

## 2012-09-15 DIAGNOSIS — R5383 Other fatigue: Secondary | ICD-10-CM

## 2012-09-15 DIAGNOSIS — F3289 Other specified depressive episodes: Secondary | ICD-10-CM

## 2012-09-15 DIAGNOSIS — N189 Chronic kidney disease, unspecified: Secondary | ICD-10-CM

## 2012-09-15 DIAGNOSIS — E785 Hyperlipidemia, unspecified: Secondary | ICD-10-CM

## 2012-09-15 DIAGNOSIS — M899 Disorder of bone, unspecified: Secondary | ICD-10-CM

## 2012-09-15 DIAGNOSIS — E669 Obesity, unspecified: Secondary | ICD-10-CM

## 2012-09-15 DIAGNOSIS — L988 Other specified disorders of the skin and subcutaneous tissue: Secondary | ICD-10-CM

## 2012-09-15 DIAGNOSIS — E039 Hypothyroidism, unspecified: Secondary | ICD-10-CM

## 2012-09-15 LAB — CBC WITH DIFFERENTIAL/PLATELET
Basophils Absolute: 0 10*3/uL (ref 0.0–0.1)
Basophils Relative: 0 % (ref 0–1)
Eosinophils Absolute: 0.1 10*3/uL (ref 0.0–0.7)
Eosinophils Relative: 1 % (ref 0–5)
Lymphs Abs: 2.3 10*3/uL (ref 0.7–4.0)
MCH: 22.4 pg — ABNORMAL LOW (ref 26.0–34.0)
MCV: 71.2 fL — ABNORMAL LOW (ref 78.0–100.0)
Neutrophils Relative %: 58 % (ref 43–77)
Platelets: 253 10*3/uL (ref 150–400)
RBC: 5.17 MIL/uL — ABNORMAL HIGH (ref 3.87–5.11)
RDW: 20.3 % — ABNORMAL HIGH (ref 11.5–15.5)

## 2012-09-15 LAB — HEMOGLOBIN A1C: Mean Plasma Glucose: 123 mg/dL — ABNORMAL HIGH (ref <117)

## 2012-09-15 NOTE — Progress Notes (Signed)
  Subjective:    Patient ID: [name], female    DOB: 1961/10/06, 51 y.o.   MRN: [id number]  HPI Pt in for follow up Hospitalized 4/1 /2013 for 6 weeks at [facility] for repairir of fistula, had complications , of infections under the skin and abcesses, had to have aother operation on 4/7 after the attempted closed bowel burst again  Has had intermittent 24 hr Ed observation for bowel obstruction, then in June for 5 days for obstruction Treated for UTI through [facility] in June for UTI TPN from March to June, now eating since June. Using topical premarin for vaginal dryness, has appt with gyne in [place] C/o left hand swelling and being "blue" espescialy early in the morning , this is not a new complaint, has been evaluated by cardiology in the past, will refer to vascular to see if any abnormality can be discovered to explain persistent symptoms   Review of Systems See HPI Denies recent fever or chills. Denies sinus pressure, nasal congestion, ear pain or sore throat. Denies chest congestion, productive cough or wheezing. Denies chest pains, palpitations and leg swelling Denies abdominal pain, nausea, vomiting,diarrhea or constipation.   Denies dysuria, frequency, hesitancy or incontinence. Denies joint pain, swelling and limitation in mobility. Denies headaches, seizures, numbness, or tingling. Denies depression, anxiety or insomnia. Denies skin break down or rash.        Objective:   Physical Exam  Patient alert and oriented and in no cardiopulmonary distress.  HEENT: No facial asymmetry, EOMI, no sinus tenderness,  oropharynx pink and moist.  Neck supple no adenopathy.  Chest: Clear to auscultation bilaterally.  CVS: S1, S2 no murmurs, no S3.  ABD: colostomy functional, no surrounding erythema around the bag. Fistula closing slowly Ext: No edema, right hand swollen slightly as compared to left  MS: Adequate ROM spine, shoulders, hips and knees.  Skin: Intact, no  ulcerations or rash noted.  Psych: Good eye contact, normal affect. Memory intact not anxious or depressed appearing.  CNS: CN 2-12 intact, power, tone and sensation normal throughout.       Assessment & Plan:

## 2012-09-15 NOTE — Patient Instructions (Addendum)
Annual wellness in January Flu vaccine today  Labs today CBC, lipid, cmp and EGFR, TSH, Vit D, HBA1C, mg, B12, phosphorous  Please sign for  A mmogram report to be sent here and you will need to request films for breast center at the center, call to arrange   Keep appt with gyne, and reduce use of premarin cream as discussed, stop as soon as you decide you are able  You are referred to vascular surgery to evaluate recurrent left hand swelling

## 2012-09-16 LAB — TSH: TSH: 2.648 u[IU]/mL (ref 0.350–4.500)

## 2012-09-16 LAB — COMPLETE METABOLIC PANEL WITH GFR
Albumin: 4.5 g/dL (ref 3.5–5.2)
Alkaline Phosphatase: 109 U/L (ref 39–117)
BUN: 18 mg/dL (ref 6–23)
CO2: 29 mEq/L (ref 19–32)
Calcium: 9.6 mg/dL (ref 8.4–10.5)
Chloride: 103 mEq/L (ref 96–112)
GFR, Est Non African American: 73 mL/min
Glucose, Bld: 89 mg/dL (ref 70–99)
Potassium: 4.5 mEq/L (ref 3.5–5.3)
Sodium: 141 mEq/L (ref 135–145)
Total Protein: 7.3 g/dL (ref 6.0–8.3)

## 2012-09-16 LAB — LIPID PANEL: Cholesterol: 278 mg/dL — ABNORMAL HIGH (ref 0–200)

## 2012-09-16 LAB — IRON: Iron: 91 ug/dL (ref 42–145)

## 2012-09-16 LAB — PHOSPHORUS: Phosphorus: 4.1 mg/dL (ref 2.3–4.6)

## 2012-09-16 LAB — LDL CHOLESTEROL, DIRECT: Direct LDL: 138 mg/dL — ABNORMAL HIGH

## 2012-09-16 MED ORDER — CYCLOBENZAPRINE HCL 10 MG PO TABS: ORAL_TABLET | ORAL | Status: DC

## 2012-09-16 MED ORDER — VERAPAMIL HCL 120 MG PO TABS: 120.0000 mg | ORAL_TABLET | Freq: Three times a day (TID) | ORAL | Status: DC

## 2012-09-17 DIAGNOSIS — M7989 Other specified soft tissue disorders: Secondary | ICD-10-CM

## 2012-09-17 DIAGNOSIS — R5383 Other fatigue: Secondary | ICD-10-CM | POA: Insufficient documentation

## 2012-09-17 HISTORY — DX: Other specified soft tissue disorders: M79.89

## 2012-09-17 HISTORY — DX: Other fatigue: R53.83

## 2012-09-17 NOTE — Assessment & Plan Note (Signed)
Surgical closure of colostomy  attempted at [facility] earlier this year, wound reportedly dehisced, pt has slow healing , still has colostomy bag

## 2012-09-17 NOTE — Assessment & Plan Note (Signed)
Persistent c/o right hand swelling with blue discoloration, will refer for vascular eval, also states she had a clot in her left arm during hos pitalization at [place]

## 2012-09-17 NOTE — Assessment & Plan Note (Signed)
Controlled, no change in medication  

## 2012-09-17 NOTE — Assessment & Plan Note (Signed)
Low normal B12, has benefiited in the past from B12 injections will administer 1 cc alt month x 2 doses and recheck value

## 2012-09-17 NOTE — Assessment & Plan Note (Signed)
Uncontrolled, needs to resume medication pravachol and tricor if not currently taking

## 2012-09-20 ENCOUNTER — Other Ambulatory Visit: Payer: Self-pay

## 2012-09-20 ENCOUNTER — Encounter: Payer: Self-pay | Admitting: Vascular Surgery

## 2012-09-20 DIAGNOSIS — M7989 Other specified soft tissue disorders: Secondary | ICD-10-CM

## 2012-09-21 ENCOUNTER — Ambulatory Visit (INDEPENDENT_AMBULATORY_CARE_PROVIDER_SITE_OTHER): Payer: Medicare Other | Admitting: Vascular Surgery

## 2012-09-21 ENCOUNTER — Encounter: Payer: Self-pay | Admitting: Vascular Surgery

## 2012-09-21 ENCOUNTER — Encounter (INDEPENDENT_AMBULATORY_CARE_PROVIDER_SITE_OTHER): Payer: Medicare Other | Admitting: Vascular Surgery

## 2012-09-21 VITALS — BP 121/83 | HR 96 | Resp 16 | Ht 64.0 in | Wt 205.0 lb

## 2012-09-21 DIAGNOSIS — R609 Edema, unspecified: Secondary | ICD-10-CM

## 2012-09-21 DIAGNOSIS — M7989 Other specified soft tissue disorders: Secondary | ICD-10-CM

## 2012-09-21 DIAGNOSIS — R600 Localized edema: Secondary | ICD-10-CM | POA: Insufficient documentation

## 2012-09-21 NOTE — Progress Notes (Signed)
[facility] [place]  Patient name: [name] MRN: [id number] DOB: 05/30/1961 Sex: female  REASON FOR CONSULT: pain and paresthesias of right upper extremity  HPI: [name] is a 51 y.o. female who was referred by Dr. Syliva Overman with pain and paresthesias in her right upper extremity. She's been having some right hand swelling and also some bluish discoloration of her hand with a history of DVT in the past and therefore was sent for further vascular evaluation. I have reviewed her records from Dr. Anthony Sar office. She has a very complicated medical history and was hospitalized in April 2013 for 6 weeks at [facility] because of a GI fistula I believe. Prior to that, in July of 2012 she has been hospitalized at [facility] for prolonged period of time with abdominal problems. During his hospitalization she states that she's had multiple IVs in the right upper extremity and also the left upper extremity. Addition she's had previous carpal tunnel surgery on her right wrist. His also had breast cancer on the left. She had a pulmonary embolus in 1997 and had some bleeding problems and therefore had an IVC filter placed. She has not been on Coumadin for many years. She believes she stopped this in 2006. In reviewing her records I do see her she had a right upper extremity venous ultrasound in April 2011 which showed no evidence of DVT but she did have some superficial thrombophlebitis in the distal right cephalic vein.  On my questioning, her main complaint has been some paresthesias in her right hand which extend upper forearm. She's had mild swelling of the hand. She has not had any recent bluish discoloration of the hand. She has not had problems with dizziness or pain in the upper part of her arm.  Past Medical History  Diagnosis Date  . SVT (supraventricular tachycardia)   . Hypercholesterolemia   . Bilateral ovarian cysts   . Perforation  bowel   . PE (pulmonary embolism)   . Thyroid disease   . GERD (gastroesophageal reflux disease)   . Sepsis   . E coli infection   . Anxiety   . Pneumonia   . Depression   . Carpal tunnel syndrome   . DVT (deep venous thrombosis)   . Anemia   . Irregular heartbeat   . CHF (congestive heart failure)   . Stroke   . DVT (deep venous thrombosis)   . Acute renal failure     renal faliure with surgery   . Cancer     left breast     Family History  Problem Relation Age of Onset  . Diabetes Mother   . Heart disease Mother   . Hypertension Mother   . Heart disease Father   . Hyperlipidemia Father   . Hypertension Father     SOCIAL HISTORY: History  Substance Use Topics  . Smoking status: Never Smoker   . Smokeless tobacco: Never Used  . Alcohol Use: No    Allergies  Allergen Reactions  . Clarithromycin Hives  . Clarithromycin Other (See Comments)    Cluster migraines   . Clindamycin Hcl Hives  . Codeine Itching  . Keflex (Cephalexin) Hives    Trouble breathing  . Nitrofurantoin Hives  . Penicillins Hives    Passed out  . Scopolamine Hbr Other (See Comments)    Cluster migraines, impaired vision  . Sulfonamide Derivatives Hives  . Tape Other (See Comments)    Blistering, use  paper tape     Current Outpatient Prescriptions  Medication Sig Dispense Refill  . butalbital-acetaminophen-caffeine (FIORICET, ESGIC) 50-325-40 MG per tablet Take 1 tablet by mouth 2 (two) times daily as needed. For migraines  14 tablet  0  . capsicum (ZOSTRIX) 0.075 % topical cream Apply 1 application topically 2 (two) times daily as needed. For neck pain       . cyclobenzaprine (FLEXERIL) 10 MG tablet TAKE 1 TABLET THREE TIMES A DAY  270 tablet  1  . Docusate Sodium (COLACE PO) Take by mouth daily.      . polyethylene glycol powder (GLYCOLAX/MIRALAX) powder Take 17 g by mouth daily.      . verapamil (CALAN) 120 MG tablet Take 1 tablet (120 mg total) by mouth 3 (three) times daily.  90  tablet  2  . XANAX 1 MG tablet TAKE (1) TABLET BY MOUTH (4) TIMES DAILY AS NEEDED.  120 tablet  4  . fenofibrate (TRICOR) 145 MG tablet Take 1 tablet (145 mg total) by mouth daily.  30 tablet  4  . fentaNYL (DURAGESIC - DOSED MCG/HR) 25 MCG/HR Place 1 patch onto the skin every 3 (three) days.      Marland Kitchen levothyroxine (SYNTHROID, LEVOTHROID) 100 MCG tablet TAKE 1 TABLET DAILY  90 tablet  1  . omeprazole (PRILOSEC) 40 MG capsule Take 1 capsule (40 mg total) by mouth daily.  90 capsule  1  . pravastatin (PRAVACHOL) 80 MG tablet TAKE 1 TABLET EVERY EVENING , DOSE INCREASE  90 tablet  1  . traZODone (DESYREL) 50 MG tablet Take 50 mg by mouth as needed.         REVIEW OF SYSTEMS: Arly.[email or web address] ] denotes positive finding; [  ] denotes negative finding  CARDIOVASCULAR:  [ ]  chest pain   [ ]  chest pressure   [ ]  palpitations   [ ]  orthopnea   [ ]  dyspnea on exertion   [ ]  claudication   [ ]  rest pain   Arly.Keller ] DVT   [ ]  phlebitis PULMONARY:   [ ]  productive cough   [ ]  asthma   [ ]  wheezing NEUROLOGIC:   Arly.Keller ] weakness  Arly.Keller ] paresthesias  [ ]  aphasia  [ ]  amaurosis  [ ]  dizziness HEMATOLOGIC:   Arly.Keller ] bleeding problems   Arly.Keller ] clotting disorders MUSCULOSKELETAL:  [ ]  joint pain   [ ]  joint swelling [ ]  leg swelling GASTROINTESTINAL: [ ]   blood in stool  [ ]   hematemesis GENITOURINARY:  [ ]   dysuria  [ ]   hematuria PSYCHIATRIC:  [ ]  history of major depression INTEGUMENTARY:  [ ]  rashes  [ ]  ulcers CONSTITUTIONAL:  [ ]  fever   [ ]  chills  PHYSICAL EXAM: Filed Vitals:   09/21/12 1548  BP: 121/83  Pulse: 96  Resp: 16  Height: 5\' 4"  (1.626 m)  Weight: 205 lb (92.987 kg)  SpO2: 99%   Body mass index is 35.19 kg/(m^2). GENERAL: The patient is a well-nourished female, in no acute distress. The vital signs are documented above. CARDIOVASCULAR: There is a regular rate and rhythm. I do not detect carotid bruits. She has palpable brachial and radial pulses bilaterally. She has a negative Allen test on the right side.  Currently she has no significant upper extremity swelling. PULMONARY: There is good air exchange bilaterally without wheezing or rales. ABDOMEN: Soft and non-tender with normal pitched bowel sounds.  MUSCULOSKELETAL: There are no major deformities or cyanosis. NEUROLOGIC: No  focal weakness or paresthesias are detected. SKIN: There are no ulcers or rashes noted. PSYCHIATRIC: The patient has a normal affect.  DATA:  I have independently interpreted her upper cherry venous duplex ultrasound which shows no evidence of DVT of the right upper extremity. She has some chronic superficial phlebitis of the right cephalic vein likely where she had previously.  MEDICAL ISSUES: I cannot explain her right upper extremity pain and paresthesias. Certainly she's had multiple IVs in the right arm and also previous carpal tunnel surgery and this could be nerve related. I did reassure her that I do not see any evidence of significant arterial insufficiency or evidence of arterial after lumbar disease.  Likewise she has no evidence of DVT or significant arm swelling. I would be happy to see her back at any time if any new vascular issues arise.   Baelynn Schmuhl S [facility] [place]: [phone]

## 2012-09-22 ENCOUNTER — Telehealth: Payer: Self-pay | Admitting: Family Medicine

## 2012-09-22 NOTE — Telephone Encounter (Signed)
Do you know who this was?

## 2012-09-23 ENCOUNTER — Other Ambulatory Visit: Payer: Self-pay | Admitting: Family Medicine

## 2012-09-23 DIAGNOSIS — M79641 Pain in right hand: Secondary | ICD-10-CM

## 2012-09-23 NOTE — Telephone Encounter (Signed)
I do not know but recommend Dr Amanda Pea for evaluation of hand pain and swelling, will put in the referral, pls sched

## 2012-10-04 ENCOUNTER — Other Ambulatory Visit: Payer: Self-pay

## 2012-10-04 ENCOUNTER — Telehealth: Payer: Self-pay | Admitting: Family Medicine

## 2012-10-04 ENCOUNTER — Ambulatory Visit: Payer: Medicare Other

## 2012-10-04 MED ORDER — PRAVASTATIN SODIUM 80 MG PO TABS: ORAL_TABLET | ORAL | Status: DC

## 2012-10-04 MED ORDER — FENOFIBRATE 145 MG PO TABS: 145.0000 mg | ORAL_TABLET | Freq: Every day | ORAL | Status: DC

## 2012-10-04 NOTE — Addendum Note (Signed)
Addended by: Abner Greenspan on: 10/04/2012 10:25 AM   Modules accepted: Orders

## 2012-10-06 ENCOUNTER — Ambulatory Visit (INDEPENDENT_AMBULATORY_CARE_PROVIDER_SITE_OTHER): Payer: Medicare Other

## 2012-10-06 VITALS — BP 126/72 | Wt 210.0 lb

## 2012-10-06 DIAGNOSIS — D509 Iron deficiency anemia, unspecified: Secondary | ICD-10-CM

## 2012-10-06 MED ORDER — CYANOCOBALAMIN 1000 MCG/ML IJ SOLN
1000.0000 ug | Freq: Once | INTRAMUSCULAR | Status: AC
  Administered 2012-10-06: 1000 ug via INTRAMUSCULAR

## 2012-10-06 NOTE — Progress Notes (Signed)
Patient in for B12 injection.  Injection given in right gluteal.  No sign or symptom of adverse reaction.  Patient aware that she should return in 4 weeks for next shot and she received repeat lab req.

## 2012-10-06 NOTE — Telephone Encounter (Signed)
Lab work not commented on by physician as of yet.  Will discuss with patient when she comes in for injection.

## 2012-10-12 ENCOUNTER — Other Ambulatory Visit: Payer: Self-pay | Admitting: Family Medicine

## 2012-10-12 ENCOUNTER — Telehealth: Payer: Self-pay | Admitting: Family Medicine

## 2012-10-12 ENCOUNTER — Telehealth: Payer: Self-pay

## 2012-10-12 DIAGNOSIS — C50919 Malignant neoplasm of unspecified site of unspecified female breast: Secondary | ICD-10-CM

## 2012-10-12 NOTE — Telephone Encounter (Signed)
Ok for her  Have Dr Jerelyn Scott follow her for breast cancer, please let her know , I am putting in the referral

## 2012-10-13 ENCOUNTER — Ambulatory Visit
Admission: RE | Admit: 2012-10-13 | Discharge: 2012-10-13 | Disposition: A | Discharge status: Home / Self Care | Payer: Medicare Other | Source: Ambulatory Visit | Attending: Family Medicine | Admitting: Family Medicine

## 2012-10-13 DIAGNOSIS — Z1231 Encounter for screening mammogram for malignant neoplasm of breast: Secondary | ICD-10-CM

## 2012-10-13 NOTE — Telephone Encounter (Signed)
This has already been approved by me and is in the referral box, please do as able and let the pt know it is being worked on

## 2012-10-17 ENCOUNTER — Other Ambulatory Visit: Payer: Self-pay | Admitting: Family Medicine

## 2012-10-17 DIAGNOSIS — N644 Mastodynia: Secondary | ICD-10-CM

## 2012-10-18 ENCOUNTER — Encounter: Payer: Medicare Other | Admitting: Vascular Surgery

## 2012-10-18 ENCOUNTER — Other Ambulatory Visit (HOSPITAL_COMMUNITY): Payer: Self-pay | Admitting: Family Medicine

## 2012-10-18 DIAGNOSIS — M542 Cervicalgia: Secondary | ICD-10-CM

## 2012-10-20 ENCOUNTER — Ambulatory Visit (HOSPITAL_COMMUNITY)
Admission: RE | Admit: 2012-10-20 | Discharge: 2012-10-20 | Disposition: A | Discharge status: Home / Self Care | Payer: Medicare Other | Source: Ambulatory Visit | Attending: Family Medicine | Admitting: Family Medicine

## 2012-10-20 DIAGNOSIS — M542 Cervicalgia: Secondary | ICD-10-CM

## 2012-10-20 DIAGNOSIS — Z981 Arthrodesis status: Secondary | ICD-10-CM | POA: Insufficient documentation

## 2012-10-20 DIAGNOSIS — M47812 Spondylosis without myelopathy or radiculopathy, cervical region: Secondary | ICD-10-CM | POA: Insufficient documentation

## 2012-10-24 ENCOUNTER — Other Ambulatory Visit (HOSPITAL_COMMUNITY): Payer: Medicare Other

## 2012-11-02 ENCOUNTER — Telehealth: Payer: Self-pay | Admitting: Family Medicine

## 2012-11-03 ENCOUNTER — Other Ambulatory Visit: Payer: Self-pay | Admitting: Family Medicine

## 2012-11-03 DIAGNOSIS — F32A Depression, unspecified: Secondary | ICD-10-CM

## 2012-11-03 DIAGNOSIS — F419 Anxiety disorder, unspecified: Secondary | ICD-10-CM

## 2012-11-03 NOTE — Telephone Encounter (Signed)
Spoke with Irish Lack aware of the call, I intend to send a card of support to [facility]

## 2012-11-03 NOTE — Telephone Encounter (Signed)
Pt agrees to psychotherapy will refer her to female provider, please refer pt for psychotherapy ,

## 2012-11-04 NOTE — Telephone Encounter (Signed)
i referred pt to phschology therpist but no one around here. Had to referral in [place]. Made appt for 11/11/2012 8:30. Left message

## 2012-12-01 ENCOUNTER — Emergency Department (HOSPITAL_COMMUNITY): Payer: Medicare Other

## 2012-12-01 ENCOUNTER — Emergency Department (HOSPITAL_COMMUNITY)
Admission: EM | Admit: 2012-12-01 | Discharge: 2012-12-01 | Disposition: A | Discharge status: Home / Self Care | Payer: Medicare Other | Attending: Emergency Medicine | Admitting: Emergency Medicine

## 2012-12-01 ENCOUNTER — Encounter (HOSPITAL_COMMUNITY): Payer: Self-pay | Admitting: Emergency Medicine

## 2012-12-01 DIAGNOSIS — E7800 Pure hypercholesterolemia, unspecified: Secondary | ICD-10-CM | POA: Insufficient documentation

## 2012-12-01 DIAGNOSIS — A419 Sepsis, unspecified organism: Secondary | ICD-10-CM | POA: Insufficient documentation

## 2012-12-01 DIAGNOSIS — N179 Acute kidney failure, unspecified: Secondary | ICD-10-CM | POA: Insufficient documentation

## 2012-12-01 DIAGNOSIS — F411 Generalized anxiety disorder: Secondary | ICD-10-CM | POA: Insufficient documentation

## 2012-12-01 DIAGNOSIS — F3289 Other specified depressive episodes: Secondary | ICD-10-CM | POA: Insufficient documentation

## 2012-12-01 DIAGNOSIS — Z862 Personal history of diseases of the blood and blood-forming organs and certain disorders involving the immune mechanism: Secondary | ICD-10-CM | POA: Insufficient documentation

## 2012-12-01 DIAGNOSIS — Z8673 Personal history of transient ischemic attack (TIA), and cerebral infarction without residual deficits: Secondary | ICD-10-CM | POA: Insufficient documentation

## 2012-12-01 DIAGNOSIS — E079 Disorder of thyroid, unspecified: Secondary | ICD-10-CM | POA: Insufficient documentation

## 2012-12-01 DIAGNOSIS — I498 Other specified cardiac arrhythmias: Secondary | ICD-10-CM | POA: Insufficient documentation

## 2012-12-01 DIAGNOSIS — Z853 Personal history of malignant neoplasm of breast: Secondary | ICD-10-CM | POA: Insufficient documentation

## 2012-12-01 DIAGNOSIS — K219 Gastro-esophageal reflux disease without esophagitis: Secondary | ICD-10-CM | POA: Insufficient documentation

## 2012-12-01 DIAGNOSIS — Z8619 Personal history of other infectious and parasitic diseases: Secondary | ICD-10-CM | POA: Insufficient documentation

## 2012-12-01 DIAGNOSIS — R197 Diarrhea, unspecified: Secondary | ICD-10-CM

## 2012-12-01 DIAGNOSIS — I509 Heart failure, unspecified: Secondary | ICD-10-CM | POA: Insufficient documentation

## 2012-12-01 DIAGNOSIS — Z8719 Personal history of other diseases of the digestive system: Secondary | ICD-10-CM | POA: Insufficient documentation

## 2012-12-01 DIAGNOSIS — Z8701 Personal history of pneumonia (recurrent): Secondary | ICD-10-CM | POA: Insufficient documentation

## 2012-12-01 DIAGNOSIS — Z8742 Personal history of other diseases of the female genital tract: Secondary | ICD-10-CM | POA: Insufficient documentation

## 2012-12-01 DIAGNOSIS — F329 Major depressive disorder, single episode, unspecified: Secondary | ICD-10-CM | POA: Insufficient documentation

## 2012-12-01 DIAGNOSIS — Z8679 Personal history of other diseases of the circulatory system: Secondary | ICD-10-CM | POA: Insufficient documentation

## 2012-12-01 DIAGNOSIS — R112 Nausea with vomiting, unspecified: Secondary | ICD-10-CM | POA: Insufficient documentation

## 2012-12-01 DIAGNOSIS — R109 Unspecified abdominal pain: Secondary | ICD-10-CM

## 2012-12-01 DIAGNOSIS — G5600 Carpal tunnel syndrome, unspecified upper limb: Secondary | ICD-10-CM | POA: Insufficient documentation

## 2012-12-01 DIAGNOSIS — Z86718 Personal history of other venous thrombosis and embolism: Secondary | ICD-10-CM | POA: Insufficient documentation

## 2012-12-01 DIAGNOSIS — I82409 Acute embolism and thrombosis of unspecified deep veins of unspecified lower extremity: Secondary | ICD-10-CM | POA: Insufficient documentation

## 2012-12-01 DIAGNOSIS — Z79899 Other long term (current) drug therapy: Secondary | ICD-10-CM | POA: Insufficient documentation

## 2012-12-01 LAB — BASIC METABOLIC PANEL
CO2: 24 mEq/L (ref 19–32)
Calcium: 9.6 mg/dL (ref 8.4–10.5)
Chloride: 105 mEq/L (ref 96–112)
Potassium: 3.9 mEq/L (ref 3.5–5.1)
Sodium: 139 mEq/L (ref 135–145)

## 2012-12-01 LAB — LIPASE, BLOOD: Lipase: 22 U/L (ref 11–59)

## 2012-12-01 LAB — CBC
Hemoglobin: 13.8 g/dL (ref 12.0–15.0)
MCH: 26.6 pg (ref 26.0–34.0)
Platelets: 221 10*3/uL (ref 150–400)
RBC: 5.18 MIL/uL — ABNORMAL HIGH (ref 3.87–5.11)
WBC: 8 10*3/uL (ref 4.0–10.5)

## 2012-12-01 LAB — HEPATIC FUNCTION PANEL
AST: 24 U/L (ref 0–37)
Albumin: 4.1 g/dL (ref 3.5–5.2)
Alkaline Phosphatase: 93 U/L (ref 39–117)
Total Protein: 7.6 g/dL (ref 6.0–8.3)

## 2012-12-01 MED ORDER — ONDANSETRON HCL 4 MG/2ML IJ SOLN: 4.0000 mg | Freq: Once | INTRAMUSCULAR | Status: DC

## 2012-12-01 MED ORDER — ONDANSETRON HCL 4 MG/2ML IJ SOLN
4.0000 mg | Freq: Once | INTRAMUSCULAR | Status: AC
  Administered 2012-12-01: 4 mg via INTRAVENOUS

## 2012-12-01 MED ORDER — IOHEXOL 300 MG/ML  SOLN
100.0000 mL | Freq: Once | INTRAMUSCULAR | Status: AC | PRN
  Administered 2012-12-01: 100 mL via INTRAVENOUS

## 2012-12-01 MED ORDER — MORPHINE SULFATE 4 MG/ML IJ SOLN
6.0000 mg | Freq: Once | INTRAMUSCULAR | Status: AC
  Administered 2012-12-01: 6 mg via INTRAVENOUS
  Filled 2012-12-01: qty 2

## 2012-12-01 MED ORDER — MORPHINE SULFATE 4 MG/ML IJ SOLN
INTRAMUSCULAR | Status: AC
  Filled 2012-12-01: qty 1

## 2012-12-01 MED ORDER — MORPHINE SULFATE 4 MG/ML IJ SOLN: 4.0000 mg | Freq: Once | INTRAMUSCULAR | Status: DC

## 2012-12-01 MED ORDER — ONDANSETRON HCL 4 MG/2ML IJ SOLN
INTRAMUSCULAR | Status: AC
  Filled 2012-12-01: qty 2

## 2012-12-01 MED ORDER — MORPHINE SULFATE 4 MG/ML IJ SOLN: 6.0000 mg | Freq: Once | INTRAMUSCULAR | Status: AC

## 2012-12-01 MED ORDER — ONDANSETRON HCL 4 MG/2ML IJ SOLN
4.0000 mg | Freq: Once | INTRAMUSCULAR | Status: AC
  Administered 2012-12-01: 4 mg via INTRAVENOUS
  Filled 2012-12-01: qty 2

## 2012-12-01 MED ORDER — PROMETHAZINE HCL 25 MG RE SUPP: 25.0000 mg | Freq: Four times a day (QID) | RECTAL | Status: DC | PRN

## 2012-12-01 MED ORDER — MORPHINE SULFATE 2 MG/ML IJ SOLN
INTRAMUSCULAR | Status: AC
  Administered 2012-12-01: 2 mg via INTRAVENOUS
  Filled 2012-12-01: qty 1

## 2012-12-01 NOTE — ED Notes (Signed)
Patient stated that anytime she was sick her blood pressure went down not up.  Patient stated that 90/50 was normal for her when she was sick.

## 2012-12-01 NOTE — ED Notes (Signed)
Pt having severe abd pain and vomiting for a couple of days. Pt has extensive abd history with colostomy. Colostomy bag is empty at this time. Pt states a couple of days ago there was blood in the bag.

## 2012-12-01 NOTE — ED Provider Notes (Signed)
History  This chart was scribed for [name], MD by Ardeen Jourdain, ED Scribe. This patient was seen in room APA15/APA15 and the patient's care was started at 1157.   CSN: [id number]  Arrival date & time 12/01/12  1142   First MD Initiated Contact with Patient 12/01/12 1157      Chief Complaint  Patient presents with  . Abdominal Pain    "blockage"     The history is provided by the patient. No language interpreter was used.    [name] is a 51 y.o. female who presents to the Emergency Department complaining of abdominal pain with associated nausea, diarrhea and emesis. She denies fever and urinary problems as associated symptoms. Her daughter states she has an enterocutaneous fistula, and has had no out put for months. Pt states 4 days ago she noticed blood in her parataenial pouch which resolved itself. She reports 2 days ago she noticed an orange drainage. She states these symptoms feel like when she has had a blockage in the past. She describes the pain as severe and is radiating into her back. She reports feeling more distended than normal. She states she has taken Murelax with no relief. She has a h/o hypercholesterolemia, perforated bowel and SVT. Pt denies smoking and alcohol use.    Past Medical History  Diagnosis Date  . SVT (supraventricular tachycardia)   . Hypercholesterolemia   . Bilateral ovarian cysts   . Perforation bowel   . PE (pulmonary embolism)   . Thyroid disease   . GERD (gastroesophageal reflux disease)   . Sepsis(995.91)   . E coli infection   . Anxiety   . Pneumonia   . Depression   . Carpal tunnel syndrome   . DVT (deep venous thrombosis)   . Anemia   . Irregular heartbeat   . CHF (congestive heart failure)   . Stroke   . DVT (deep venous thrombosis)   . Acute renal failure     renal faliure with surgery   . Cancer     left breast     Past Surgical History  Procedure Date  . Ablasion   . Vena cava filter placement   .  Abdominal hysterectomy   . Hernia repair   . Colon surgery   . Bowel resection   . Carpal tunnel release   . Bowel fistula     Family History  Problem Relation Age of Onset  . Diabetes Mother   . Heart disease Mother   . Hypertension Mother   . Heart disease Father   . Hyperlipidemia Father   . Hypertension Father     History  Substance Use Topics  . Smoking status: Never Smoker   . Smokeless tobacco: Never Used  . Alcohol Use: No    No OB history available.   Review of Systems  All other systems reviewed and are negative.  A complete 10 system review of systems was obtained and all systems are negative except as noted in the HPI and PMH.    Allergies  Clarithromycin; Clarithromycin; Clindamycin hcl; Codeine; Keflex; Nitrofurantoin; Penicillins; Scopolamine hbr; Sulfonamide derivatives; and Tape  Home Medications   Current Outpatient Rx  Name  Route  Sig  Dispense  Refill  . ALPRAZOLAM 1 MG PO TABS               . BUTALBITAL-APAP-CAFFEINE 50-325-40 MG PO TABS   Oral   Take 1 tablet by mouth 2 (two) times daily as  needed. For migraines   14 tablet   0   . CAPSAICIN 0.075 % EX CREA   Topical   Apply 1 application topically 2 (two) times daily as needed. For neck pain          . CYCLOBENZAPRINE HCL 10 MG PO TABS      TAKE 1 TABLET THREE TIMES A DAY   270 tablet   1   . COLACE PO   Oral   Take 2 capsules by mouth 2 (two) times daily.          . FENOFIBRATE 145 MG PO TABS   Oral   Take 1 tablet (145 mg total) by mouth daily.   90 tablet   1   . LEVOTHYROXINE SODIUM 100 MCG PO TABS      TAKE 1 TABLET DAILY   90 tablet   1   . OMEPRAZOLE 40 MG PO CPDR   Oral   Take 1 capsule (40 mg total) by mouth daily.   90 capsule   1   . POLYETHYLENE GLYCOL 3350 PO POWD   Oral   Take 17 g by mouth 4 (four) times daily.          Marland Kitchen PRAVASTATIN SODIUM 80 MG PO TABS      TAKE 1 TABLET EVERY EVENING , DOSE INCREASE   90 tablet   1   .  PREMARIN 0.625 MG/GM VA CREA   Vaginal   Place 1 application vaginally Daily.         Marland Kitchen VERAPAMIL HCL 120 MG PO TABS   Oral   Take 1 tablet (120 mg total) by mouth 3 (three) times daily.   90 tablet   2     Triage Vitals: BP 111/89  Pulse 114  Temp 98.1 F (36.7 C) (Oral)  Resp 26  Ht 5\' 4"  (1.626 m)  Wt 222 lb (100.699 kg)  BMI 38.11 kg/m2  SpO2 98%  Physical Exam  Nursing note and vitals reviewed. Constitutional: She is oriented to person, place, and time. She appears well-developed and well-nourished. No distress.  HENT:  Head: Normocephalic and atraumatic.  Eyes: EOM are normal.  Neck: Normal range of motion.  Cardiovascular: Normal rate, regular rhythm and normal heart sounds.   Pulmonary/Chest: Effort normal and breath sounds normal.  Abdominal: Soft. Bowel sounds are normal. She exhibits no distension. There is tenderness. There is no rebound and no guarding.       No peritonitis, parataenial pouch with yellow material and gas, no surrounding cellulitis, mild distension, mild generalized tenderness  Musculoskeletal: Normal range of motion.  Neurological: She is alert and oriented to person, place, and time.  Skin: Skin is warm and dry.  Psychiatric: She has a normal mood and affect. Judgment normal.    ED Course  Procedures (including critical care time)  DIAGNOSTIC STUDIES: Oxygen Saturation is 98% on room air, normal by my interpretation.    COORDINATION OF CARE:  12:29 PM: Discussed treatment plan which includes blood work and pain medication with pt at bedside and pt agreed to plan.    Labs Reviewed  CBC - Abnormal; Notable for the following:    RBC 5.18 (*)     RDW 17.7 (*)     All other components within normal limits  BASIC METABOLIC PANEL - Abnormal; Notable for the following:    Glucose, Bld 106 (*)     GFR calc non Af Amer 63 (*)     GFR  calc Af Denyse Dago 73 (*)     All other components within normal limits  HEPATIC FUNCTION PANEL  LIPASE,  BLOOD   Ct Abdomen Pelvis W Contrast  12/01/2012  *RADIOLOGY REPORT*  Clinical Data: Abdominal pain, history of small bowel obstruction, history of jejunostomy, new drainage in the drainage bag.  CT ABDOMEN AND PELVIS WITH CONTRAST  Technique:  Multidetector CT imaging of the abdomen and pelvis was performed following the standard protocol during bolus administration of intravenous contrast.  Contrast: OMNIPAQUE IOHEXOL 300 MG/ML  SOLN  Comparison: 07/02/2012  Findings: Sagittal images of the spine are unremarkable.  A right breast prosthesis is noted.  Lung bases are unremarkable.  Enhanced liver shows no focal hepatic mass.  Mild intrahepatic biliary ductal dilatation.  The patient is status post cholecystectomy.  IVC filter in place is noted.  Stable postsurgical changes left flank of the abdomen.  Midline upper abdominal wall scarring is noted.  Again noted midline lower abdominal wall defect. There is heterogeneous material at the level of abdominal wall defect and small amount of air.  This may represent draining material.  Enhanced kidneys are symmetrical in size.  No hydronephrosis or hydroureter.  The pancreas spleen and adrenal glands are unremarkable.  Delayed renal images shows bilateral renal symmetrical excretion.  Again noted anastomosis of the small bowel with the sigmoid colon. Contrast material is noted in proximal small bowel.  In axial image 39 small amount of air is noted midline anterior wall above the fascia at the level of the surgical sutures in the anterior small bowel.  A linear tract of air is also noted in axial image 31 midline anterior abdominal wall.  Findings are highly suspicious for enterocutaneous fistula.  Clinical correlation is necessary.  No definite oral contrast material extravasation is noted.  No mesenteric abscess or fluid collection is noted.  The urinary bladder is unremarkable.  The patient is status post hysterectomy.  No destructive bony lesions are noted  within pelvis. Stable postsurgical changes lower abdomen.  IMPRESSION:  1.  Again noted midline abdominal anterior wall defect There are postsurgical changes in the small bowel just adjacent to anterior abdominal wall.  There is a small amount of the linear air in the upper abdominal wall just above the fascia.  This is best visualized in the sagittal image 70.  Findings are highly suspicious for enterocutaneous fistula.  Clinical correlation is necessary. 2.  Stable postsurgical changes with anastomosis of the small bowel to sigmoid colon.  No definite oral contrast material extravasation is noted. 3.  No small bowel obstruction. 4.  IVC filter in place. 5.  Status post cholecystectomy.  Mild intrahepatic biliary ductal dilatation. 6.  Status post hysterectomy.   Original Report Authenticated By: Natasha Mead, M.D.    I personally reviewed the imaging tests through PACS system I reviewed available ER/hospitalization records through the EMR  1. Abdominal pain   2. Nausea vomiting and diarrhea       MDM  4:32 PM The patient feels much better at this time.  She is agreeable to outpatient plan.  She has a known enterocutaneous fistula and therefore her CT scan findings are not concerning or surprising.  She has no evidence of bowel obstruction.  She has no evidence of intra-abdominal abscess or any other acute intra-abdominal process.  She's being followed closely at [facility] for her enterocutaneous fistula.  Her daughter is an ICU nurse and agrees that she looks much better at this time  and is agreeable to outpatient plan.  All questions were answered.  She will call her team at [facility] tomorrow if her symptoms continue.  She understands to return the emergency department for new or worsening symptoms        [name], MD 12/01/12 [phone]

## 2012-12-01 NOTE — ED Notes (Signed)
Lower abd pain x 1 week. jejunostomy present. Has not had any output x 1 month but states doctors wanted that so fistula would close up. This am having "contraction" pains to lower abd and having orange like drainage to bag. Pt grimacing and severe pain observed.

## 2012-12-16 ENCOUNTER — Telehealth: Payer: Self-pay | Admitting: Family Medicine

## 2012-12-16 DIAGNOSIS — D509 Iron deficiency anemia, unspecified: Secondary | ICD-10-CM

## 2012-12-16 DIAGNOSIS — E785 Hyperlipidemia, unspecified: Secondary | ICD-10-CM

## 2012-12-16 DIAGNOSIS — R7301 Impaired fasting glucose: Secondary | ICD-10-CM

## 2012-12-16 DIAGNOSIS — E669 Obesity, unspecified: Secondary | ICD-10-CM

## 2012-12-16 DIAGNOSIS — R5381 Other malaise: Secondary | ICD-10-CM

## 2012-12-16 DIAGNOSIS — R5383 Other fatigue: Secondary | ICD-10-CM

## 2012-12-16 DIAGNOSIS — E039 Hypothyroidism, unspecified: Secondary | ICD-10-CM

## 2012-12-16 NOTE — Telephone Encounter (Signed)
Will mail to pt

## 2012-12-16 NOTE — Telephone Encounter (Signed)
What other labs besides a1c does she need ordered?

## 2012-12-16 NOTE — Addendum Note (Signed)
Addended by: Abner Greenspan on: 12/16/2012 04:03 PM   Modules accepted: Orders

## 2012-12-16 NOTE — Telephone Encounter (Signed)
Needs HBA1C fasting lipid, cmp and EGFR, TSH

## 2012-12-23 LAB — COMPLETE METABOLIC PANEL WITH GFR
AST: 17 U/L (ref 0–37)
BUN: 23 mg/dL (ref 6–23)
CO2: 22 mEq/L (ref 19–32)
Calcium: 9.4 mg/dL (ref 8.4–10.5)
Chloride: 107 mEq/L (ref 96–112)
Creat: 1.17 mg/dL — ABNORMAL HIGH (ref 0.50–1.10)
GFR, Est African American: 62 mL/min
Total Bilirubin: 0.3 mg/dL (ref 0.3–1.2)

## 2012-12-23 LAB — LIPID PANEL
Cholesterol: 167 mg/dL (ref 0–200)
HDL: 54 mg/dL (ref >39)
Triglycerides: 103 mg/dL (ref <150)
VLDL: 21 mg/dL (ref 0–40)

## 2012-12-23 LAB — TSH: TSH: 7.655 u[IU]/mL — ABNORMAL HIGH (ref 0.350–4.500)

## 2012-12-25 ENCOUNTER — Other Ambulatory Visit: Payer: Self-pay | Admitting: Family Medicine

## 2013-01-02 ENCOUNTER — Other Ambulatory Visit: Payer: Self-pay

## 2013-01-02 MED ORDER — LEVOTHYROXINE SODIUM 125 MCG PO TABS: 125.0000 ug | ORAL_TABLET | Freq: Every day | ORAL | Status: DC

## 2013-01-03 ENCOUNTER — Ambulatory Visit
Admission: RE | Admit: 2013-01-03 | Discharge: 2013-01-03 | Disposition: A | Discharge status: Home / Self Care | Payer: Medicare Other | Source: Ambulatory Visit | Attending: Family Medicine | Admitting: Family Medicine

## 2013-01-03 DIAGNOSIS — N644 Mastodynia: Secondary | ICD-10-CM

## 2013-01-09 ENCOUNTER — Encounter (INDEPENDENT_AMBULATORY_CARE_PROVIDER_SITE_OTHER): Payer: Self-pay | Admitting: Surgery

## 2013-01-09 ENCOUNTER — Ambulatory Visit (INDEPENDENT_AMBULATORY_CARE_PROVIDER_SITE_OTHER): Payer: Medicare Other | Admitting: Surgery

## 2013-01-09 VITALS — BP 120/74 | HR 89 | Temp 98.0°F | Ht 64.5 in | Wt 223.6 lb

## 2013-01-09 DIAGNOSIS — Z803 Family history of malignant neoplasm of breast: Secondary | ICD-10-CM

## 2013-01-09 NOTE — Progress Notes (Signed)
Patient ID: [name], female   DOB: 06-27-1961, 52 y.o.   MRN: [id number]  Chief Complaint  Patient presents with  . New Evaluation    eval Rt nipple itching    HPI [name] is a 52 y.o. female.  Patient sent at the request of Dr.Lin for right nipple itching. The patient has a history of left breast Paget's disease who underwent a left simple mastectomy with flap reconstruction at [facility] in 2011. She subsequently in 2012 underwent exploratory laparotomy and salpingo-oophorectomy for an ovarian cyst that was felt to be malignant but turned out to be benign. Her postop course was complicated by fascial dehiscent and subsequent development of an enterocutaneous fistula. She is transferred her care to [facility] and continues to have problems with her enterocutaneous fistula. She is scheduled to see Dr. Jacquenette Shone next month for this. She complains of her right nipple being sunken in. There is no bleeding or itching or rash. She has a history of her right breast reduction with subsequent implant placement at the time of her left breast reconstruction for Paget's disease. There is no mass. Recent mammography was negative. She's not had any right breast pain and is concerned about the implant and states the appearance of her right breast is changed. HPI  Past Medical History  Diagnosis Date  . SVT (supraventricular tachycardia)   . Hypercholesterolemia   . Bilateral ovarian cysts   . Perforation bowel   . PE (pulmonary embolism)   . Thyroid disease   . GERD (gastroesophageal reflux disease)   . Sepsis(995.91)   . E coli infection   . Anxiety   . Pneumonia   . Depression   . Carpal tunnel syndrome   . DVT (deep venous thrombosis)   . Anemia   . Irregular heartbeat   . CHF (congestive heart failure)   . Stroke   . DVT (deep venous thrombosis)   . Acute renal failure     renal faliure with surgery   . Cancer     left breast   . Blood transfusion without reported diagnosis   .  Clotting disorder     Past Surgical History  Procedure Date  . Ablasion   . Vena cava filter placement   . Abdominal hysterectomy   . Hernia repair   . Colon surgery   . Bowel resection   . Carpal tunnel release   . Bowel fistula     Family History  Problem Relation Age of Onset  . Diabetes Mother   . Heart disease Mother   . Hypertension Mother   . Heart disease Father   . Hyperlipidemia Father   . Hypertension Father   . Cancer Maternal Aunt     colon  . Cancer Maternal Uncle     mets  . Cancer Maternal Grandfather     bone/mets  . Cancer Cousin 19    ovarian  . Cancer Maternal Uncle     prostate    Social History History  Substance Use Topics  . Smoking status: Never Smoker   . Smokeless tobacco: Never Used  . Alcohol Use: Yes     Comment: rare    Allergies  Allergen Reactions  . Clarithromycin Hives  . Clarithromycin Other (See Comments)    Cluster migraines   . Clindamycin Hcl Hives  . Codeine Itching  . Keflex (Cephalexin) Hives    Trouble breathing  . Nitrofurantoin Hives  . Penicillins Hives    Passed out  .  Scopolamine Hbr Other (See Comments)    Cluster migraines, impaired vision  . Sulfonamide Derivatives Hives  . Tape Other (See Comments)    Blistering, use paper tape     Current Outpatient Prescriptions  Medication Sig Dispense Refill  . ALPRAZolam (XANAX) 1 MG tablet       . ALPRAZolam (XANAX) 1 MG tablet 1 mg. Take 1 mg by mouth 4 (four) times daily.      . butalbital-acetaminophen-caffeine (FIORICET, ESGIC) 50-325-40 MG per tablet Take 1 tablet by mouth 2 (two) times daily as needed. For migraines  14 tablet  0  . capsicum (ZOSTRIX) 0.075 % topical cream Apply 1 application topically 2 (two) times daily as needed. For neck pain       . clonazePAM (KLONOPIN) 1 MG tablet 1 mg. Take 1 mg by mouth as needed.      . cyclobenzaprine (FLEXERIL) 10 MG tablet TAKE 1 TABLET THREE TIMES A DAY  270 tablet  1  . cyclobenzaprine (FLEXERIL) 10  MG tablet 10 mg. Take 10 mg by mouth 3 (three) times daily.      Tery Sanfilippo Sodium (COLACE PO) Take 2 capsules by mouth 2 (two) times daily.       . fenofibrate (TRICOR) 145 MG tablet Take 1 tablet (145 mg total) by mouth daily.  90 tablet  1  . fenofibrate (TRICOR) 145 MG tablet Daily.      Marland Kitchen ibuprofen (ADVIL,MOTRIN) 800 MG tablet continuously as needed.      Marland Kitchen levothyroxine (SYNTHROID) 125 MCG tablet Take 1 tablet (125 mcg total) by mouth daily.  90 tablet  1  . Multiple Vitamins tablet 1 tablet. Take 1 tablet by mouth daily.      Marland Kitchen omeprazole (PRILOSEC) 40 MG capsule Take 1 capsule (40 mg total) by mouth daily.  90 capsule  1  . omeprazole (PRILOSEC) 40 MG capsule 40 mg. Take 40 mg by mouth 2 (two) times daily.      . ondansetron (ZOFRAN) 4 MG tablet 4 mg. Take 4 mg by mouth every 8 (eight) hours as needed.      . polyethylene glycol (MIRALAX / GLYCOLAX) packet Take 17 g by mouth 2 (two) times daily. Mix in 4-8ounces of fluid prior to taking.      . polyethylene glycol powder (GLYCOLAX/MIRALAX) powder Take 17 g by mouth 4 (four) times daily.       . pravastatin (PRAVACHOL) 80 MG tablet TAKE 1 TABLET EVERY EVENING , DOSE INCREASE  90 tablet  1  . pravastatin (PRAVACHOL) 80 MG tablet Daily.      Marland Kitchen PREMARIN vaginal cream Place 1 application vaginally Daily.      . promethazine (PHENERGAN) 25 MG suppository Place 1 suppository (25 mg total) rectally every 6 (six) hours as needed for nausea.  12 each  0  . promethazine (PHENERGAN) 25 MG tablet 25 mg. Take 25 mg by mouth every 6 (six) hours as needed.      . traZODone (DESYREL) 50 MG tablet 50 mg. Take 50 mg by mouth nightly.      . verapamil (CALAN) 120 MG tablet Take 1 tablet (120 mg total) by mouth 3 (three) times daily.  90 tablet  2  . verapamil (CALAN) 120 MG tablet Daily.      Marland Kitchen levothyroxine (SYNTHROID, LEVOTHROID) 125 MCG tablet Take 125 mcg by mouth daily.        Review of Systems Review of Systems  Constitutional: Negative.   HENT:  Negative.  Eyes: Negative.   Respiratory: Negative.   Cardiovascular: Negative.   Gastrointestinal: Positive for abdominal pain.  Genitourinary: Negative.   Musculoskeletal: Negative.   Neurological: Negative.   Hematological: Negative.   Psychiatric/Behavioral: Negative.     Blood pressure 120/74, pulse 89, temperature 98 F (36.7 C), temperature source Temporal, height 5' 4.5" (1.638 m), weight 223 lb 9.6 oz (101.424 kg), SpO2 97.00%.  Physical Exam Physical Exam  Constitutional: She is oriented to person, place, and time. She appears well-developed and well-nourished.  HENT:  Head: Normocephalic and atraumatic.  Eyes: EOM are normal. Pupils are equal, round, and reactive to light.  Neck: Normal range of motion. Neck supple.  Cardiovascular: Normal rate and regular rhythm.   Pulmonary/Chest: Right breast exhibits no inverted nipple, no mass, no nipple discharge, no skin change and no tenderness. Breasts are asymmetrical.    Abdominal:    Musculoskeletal: Normal range of motion.  Neurological: She is alert and oriented to person, place, and time.  Skin: Skin is warm and dry.  Psychiatric: She has a normal mood and affect. Her behavior is normal. Judgment and thought content normal.      Assessment    History of breast cancer with bilateral reconstruction/implant  and change in right breast symmetry with normal mammogram    Plan    Pt needs MRI  With implant history and asymmetry to evaluate implant for rupture.   Return 3 months for recheck       Zulay Corrie A. 01/09/2013, 1:28 PM

## 2013-01-09 NOTE — Patient Instructions (Signed)
You need a breast MRI to evaluate implant and nipple changes.

## 2013-01-10 ENCOUNTER — Encounter: Payer: Self-pay | Admitting: Family Medicine

## 2013-01-10 ENCOUNTER — Ambulatory Visit (INDEPENDENT_AMBULATORY_CARE_PROVIDER_SITE_OTHER): Payer: Medicare Other | Admitting: Family Medicine

## 2013-01-10 VITALS — BP 124/82 | HR 96 | Resp 16 | Ht 64.5 in | Wt 222.8 lb

## 2013-01-10 DIAGNOSIS — R51 Headache: Secondary | ICD-10-CM

## 2013-01-10 DIAGNOSIS — R490 Dysphonia: Secondary | ICD-10-CM

## 2013-01-10 DIAGNOSIS — R519 Headache, unspecified: Secondary | ICD-10-CM

## 2013-01-10 DIAGNOSIS — Z0000 Encounter for general adult medical examination without abnormal findings: Secondary | ICD-10-CM

## 2013-01-10 DIAGNOSIS — E039 Hypothyroidism, unspecified: Secondary | ICD-10-CM

## 2013-01-10 NOTE — Progress Notes (Signed)
Subjective:    Patient ID: [name], female    DOB: 02-May-1961, 52 y.o.   MRN: [id number]  HPI Preventive Screening-Counseling & Management   Patient present here today for a Medicare annual wellness visit. She c/o throbbing left sided headache for 1 day, no tingling or numbness, no nausea associated with this.She does have a g/o chronic headaches Also has a c/o chronic painless hoarseness x months, which she would like further evaluated. Has concens about the surgery she had for removal of adnexal mass at [facility].States op note states that there was rupture of the mass, with no specimen sent for pathology, and she is very concerned about this. Has upcoming appt with local oncologist who she plans to furhter discuss this with   Current Problems (verified)   Medications Prior to Visit Allergies (verified)   PAST HISTORY  Family History.mother diabetic, father macular degeneration and CHF both age 53. Only brother stillborn  Social History Married x 22 years, 1 daughter, and 2 stepsons, never alcohol, tobacco or street drugs Disabled in 1996 due to heart disease. Legal sec x 26yrs   Risk Factors  Current exercise habits:  Current 7 mins per day stepper, plans to inc in increments  Dietary issues discussed:Increase Veg, fruit, water intake, reduce fats , carbs and sweets, increase grains   Cardiac risk factors: h/o SVT she is s/p ablation  Depression Screen  (Note: if answer to either of the following is "Yes", a more complete depression screening is indicated)   Over the past two weeks, have you felt down, depressed or hopeless? No  Over the past two weeks, have you felt little interest or pleasure in doing things? No  Have you lost interest or pleasure in daily life? No  Do you often feel hopeless? No  Do you cry easily over simple problems? No   Activities of Daily Living  In your present state of health, do you have any difficulty performing the following  activities?  Driving?: No Managing money?: No Feeding yourself?:No Getting from bed to chair?:No Climbing a flight of stairs?:No Preparing food and eating?:No Bathing or showering?:No Getting dressed?:No Getting to the toilet?:No Using the toilet?:No Moving around from place to place?: No  Fall Risk Assessment In the past year have you fallen or had a near fall?two falls this year, feels like blood pressure was low Are you currently taking any medications that make you dizzy?:No   Hearing Difficulties: No Do you often ask people to speak up or repeat themselves?:No Do you experience ringing or noises in your ears?:No Do you have difficulty understanding soft or whispered voices?:No  Cognitive Testing  Alert? Yes Normal Appearance?Yes  Oriented to person? Yes Place? Yes  Time? Yes  Displays appropriate judgment?Yes  Can read the correct time from a watch face? yes Are you having problems remembering things?No  Advanced Directives have been discussed with the patient?Yes , cull code   List the Names of Other Physician/Practitioners you currently use: Dr Jacquenette Shone [facility]), Dr Ophelia Charter and Jorge Mandril, Dr Luisa Hart   I from other than [facility] providers in the past year (date may be approximate).   Assessment:    Annual Wellness Exam   Plan:    During the course of the visit the patient was educated and counseled about appropriate screening and preventive services including:  A healthy diet is rich in fruit, vegetables and whole grains. Poultry fish, nuts and beans are a healthy choice for protein rather then red meat. A low  sodium diet and drinking 64 ounces of water daily is generally recommended. Oils and sweet should be limited. Carbohydrates especially for those who are diabetic or overweight, should be limited to 30-45 gram per meal. It is important to eat on a regular schedule, at least 3 times daily. Snacks should be primarily fruits, vegetables or nuts. It is important that you  exercise regularly at least 30 minutes 5 times a week. If you develop chest pain, have severe difficulty breathing, or feel very tired, stop exercising immediately and seek medical attention  Immunization reviewed and updated. Cancer screening reviewed and updated    Patient Instructions (the written plan) was given to the patient.  Medicare Attestation  I have personally reviewed:  [name] medical and social history  Their use of alcohol, tobacco or illicit drugs  Their current medications and supplements  [name] functional ability including ADLs,fall risks, home safety risks, cognitive, and hearing and visual impairment  Diet and physical activities  Evidence for depression or mood disorders  [name] weight, height, BMI, and visual acuity have been recorded in the chart. I have made referrals, counseling, and provided education to the patient based on review of the above and I have provided the patient with a written personalized care plan for preventive services.      Review of Systems Denies sinus pressure, nasal drainage , sore throat , cough , fever or chills.     Objective:   Physical Exam  Patient alert and oriented and in no cardiopulmonary distress.  HEENT: No facial asymmetry, EOMI, no sinus tenderness,  oropharynx pink and moist. Neck adequate ROM, supple no adenopathy.   .  Ext: No edema    Psych: Good eye contact, normal affect. Memory intact not anxious or depressed appearing.  CNS: CN 2-12 intact, power, tone and sensation normal throughout.       Assessment & Plan:

## 2013-01-10 NOTE — Patient Instructions (Addendum)
F/U in 4 month,  Weight loss goal of 3 to 4 pounds per month  We will refer you for re evaluation of mild kidney disease based on most recent labs at [facility] ( Dr Garner Nash)  Pls call and reschedule your gyne eval  You will follow be referred to Dr Clide Cliff per your request for cardiac evaluation , eval of intermittent chest, pain and palpitations  Pls log into My chart  You will be referred to dr. Suszanne Conners for evaluation of chronic painless hoarseness and sore throat  Please discuss all concerns in full with Dr Mariel Sleet when you see him  Tsh to be repeated in 6 to 8 weeks  Toradol 60 mg , depo medrol 80 mg and tylenol 2 tqblets in office today for throbblng left headache Aspirin  81 mg daily and further discuss increase with  cardiology

## 2013-01-11 ENCOUNTER — Other Ambulatory Visit (INDEPENDENT_AMBULATORY_CARE_PROVIDER_SITE_OTHER): Payer: Self-pay

## 2013-01-11 DIAGNOSIS — R51 Headache: Secondary | ICD-10-CM

## 2013-01-11 DIAGNOSIS — Z853 Personal history of malignant neoplasm of breast: Secondary | ICD-10-CM

## 2013-01-11 MED ORDER — KETOROLAC TROMETHAMINE 60 MG/2ML IM SOLN
60.0000 mg | Freq: Once | INTRAMUSCULAR | Status: AC
  Administered 2013-01-11: 60 mg via INTRAMUSCULAR

## 2013-01-11 MED ORDER — METHYLPREDNISOLONE ACETATE 80 MG/ML IJ SUSP
80.0000 mg | Freq: Once | INTRAMUSCULAR | Status: AC
  Administered 2013-01-11: 80 mg via INTRAMUSCULAR

## 2013-01-12 ENCOUNTER — Telehealth: Payer: Self-pay | Admitting: Family Medicine

## 2013-01-12 MED ORDER — BUTALBITAL-APAP-CAFFEINE 50-325-40 MG PO TABS: 1.0000 | ORAL_TABLET | Freq: Two times a day (BID) | ORAL | Status: DC | PRN

## 2013-01-12 NOTE — Telephone Encounter (Signed)
Will fax to [facility]

## 2013-01-16 ENCOUNTER — Encounter (HOSPITAL_COMMUNITY): Payer: Self-pay | Admitting: Oncology

## 2013-01-16 ENCOUNTER — Encounter (HOSPITAL_COMMUNITY): Payer: Medicare Other | Attending: Oncology | Admitting: Oncology

## 2013-01-16 ENCOUNTER — Encounter: Payer: Self-pay | Admitting: Family Medicine

## 2013-01-16 VITALS — BP 122/80 | HR 92 | Temp 98.3°F | Resp 16 | Ht 62.75 in | Wt 222.4 lb

## 2013-01-16 DIAGNOSIS — Z9079 Acquired absence of other genital organ(s): Secondary | ICD-10-CM

## 2013-01-16 DIAGNOSIS — R519 Headache, unspecified: Secondary | ICD-10-CM

## 2013-01-16 DIAGNOSIS — R490 Dysphonia: Secondary | ICD-10-CM | POA: Insufficient documentation

## 2013-01-16 DIAGNOSIS — C50919 Malignant neoplasm of unspecified site of unspecified female breast: Secondary | ICD-10-CM

## 2013-01-16 DIAGNOSIS — Z86711 Personal history of pulmonary embolism: Secondary | ICD-10-CM

## 2013-01-16 DIAGNOSIS — Z0000 Encounter for general adult medical examination without abnormal findings: Secondary | ICD-10-CM | POA: Insufficient documentation

## 2013-01-16 DIAGNOSIS — C50019 Malignant neoplasm of nipple and areola, unspecified female breast: Secondary | ICD-10-CM

## 2013-01-16 DIAGNOSIS — Z5181 Encounter for therapeutic drug level monitoring: Secondary | ICD-10-CM | POA: Insufficient documentation

## 2013-01-16 DIAGNOSIS — G894 Chronic pain syndrome: Secondary | ICD-10-CM | POA: Insufficient documentation

## 2013-01-16 DIAGNOSIS — Z79891 Long term (current) use of opiate analgesic: Secondary | ICD-10-CM | POA: Insufficient documentation

## 2013-01-16 HISTORY — DX: Headache, unspecified: R51.9

## 2013-01-16 NOTE — Assessment & Plan Note (Signed)
Under corrected when recently checked, rept lab in 6 to 8 weeks, dose adjustment has been made

## 2013-01-16 NOTE — Patient Instructions (Addendum)
[facility] Discharge Instructions  RECOMMENDATIONS MADE BY THE CONSULTANT AND ANY TEST RESULTS WILL BE SENT TO YOUR REFERRING PHYSICIAN.  EXAM FINDINGS BY THE PHYSICIAN TODAY AND SIGNS OR SYMPTOMS TO REPORT TO CLINIC OR PRIMARY PHYSICIAN: exam and discussion by MD.  Your blood work is good and think it's good that you are going to see a kidney doctor.  Your creatinine has been stable for the last few months.  Do think you need to get input from a gynecologist about the estrogen.  MEDICATIONS PRESCRIBED:  none  INSTRUCTIONS GIVEN AND DISCUSSED: none  SPECIAL INSTRUCTIONS/FOLLOW-UP: As needed  Thank you for choosing [facility] to provide your oncology and hematology care.  To afford each patient quality time with our providers, please arrive at least 15 minutes before your scheduled appointment time.  With your help, our goal is to use those 15 minutes to complete the necessary work-up to ensure our physicians have the information they need to help with your evaluation and healthcare recommendations.    Effective January 1st, 2014, we ask that you re-schedule your appointment with our physicians should you arrive 10 or more minutes late for your appointment.  We strive to give you quality time with our providers, and arriving late affects you and other patients whose appointments are after yours.    Again, thank you for choosing [facility].  Our hope is that these requests will decrease the amount of time that you wait before being seen by our physicians.       _____________________________________________________________  Should you have questions after your visit to [facility], please contact our office at [phone] between the hours of 8:30 a.m. and 5:00 p.m.  Voicemails left after 4:30 p.m. will not be returned until the following business day.  For prescription refill requests, have your pharmacy contact our office with  your prescription refill request.

## 2013-01-16 NOTE — Progress Notes (Signed)
Problem number 1 history of Paget's disease of the left breast status post resection with breast reconstruction with a left sided TRAM flap. This was done at [facility] with her initial diagnosis of Paget's disease in 2006. Problem #2 multiple complications from exploratory laparotomy for possible ovarian tumor the turned out to be benign cystic disease of the ovary status post surgery initially July 2012 complicated by deep his radiation and repeat surgery in early August 2012. She then had reoperation one more time at [facility] in either November or December 2012. She now has a fistula that is still draining through an open wound in the midline. This has a vac bag over this period she however still has gas and small amounts of bowel content come through this opening. Problem #3 history of heart disease requiring ablations in the 1990s Problem #4 history of pulmonary emboli Problem #5 history of atrial fibrillation/flutter in the past Problem #6 history of hyperlipidemia well-controlled Problem #7 history of myalgia Problem #8 history of recurrent MRSA infections in the past Problem #9 obesity Problem #10 decreased libido/pain on intercourse Problem #11 history multiple operations for bowel obstruction in the past Problem #12 history of cholecystectomy partial colon resection in 2003  Very pleasant 52 year old lady whose recent operation at [facility] in July 2012 turned into a nightmare for her. She had evisceration of her bowels and several days later was taken back with a no other abdominal incision made in the midline this time. She was in the hospital for 6 weeks healing from outside in.  She then had a second opinion at [facility] where they operated on her in the fall of 2012 as well try to correct issues caused by the first 2 operation she states. She still going to the [facility] physician. She has her next appointment on February 3 and hopes to have this fistulous tract an open wound  repaired at some point in the future. One of her biggest concerns is that with the removal of her ovaries and uterus and the illnesses followed, is that her sexual functioning is down to essentially nothing. She was prescribed some vaginal cream of estrogen nature but this has been discouraged to be used she states by one of her physicians.  From an oncology review of systems all is negative. She did have a recent change in her breast symptom on the right breast where she has some itchiness and an MRI is being done to make sure nothing is present. The right breast is also had surgery with an implant in the past.  She remains a nonsmoker. She occasionally has a glass of wine. BP 122/80  Pulse 92  Temp 98.3 F (36.8 C) (Oral)  Resp 16  Ht 5' 2.75" (1.594 m)  Wt 222 lb 6.4 oz (100.88 kg)  BMI 39.71 kg/m2  Pleasant woman in no acute distress. She has dark full head of hair. She has no lymphadenopathy or obvious thyromegaly. HEENT exam is unremarkable. Teeth are in good repair. Breast exam shows surgical changes on both breasts but no obvious masses and no obvious changes around the right nipple areola complex or the left reconstructed nipple. Lungs are clear to auscultation and percussion. Skin exam is unremarkable. Heart shows a regular rhythm and rate without murmur rub or gallop at this time. Her abdomen shows no hepatosplenomegaly. Bowel sounds are diminished presently. She has no tenderness but she does have a midline and opened wound at approximately 4 cm across by partially 3 cm.  There is what appears to be a small fistulous opening at the superior portion of his wound. Her wound itself otherwise looks clean. She has no inguinal adenopathy as well as no adenopathy in the cervical, supraclavicular, infraclavicular or axillary areas. She has no leg edema. Pulses in her feet are 1+ and symmetrical. She is right handed.  She does have a decreased GFR with a minimally elevated creatinine in relation  to prior ones and tells me she has a nephrology consultation at [facility] in the near future. I think that is very reasonable. Otherwise her CBC is unremarkable liver enzymes are unremarkable and cholesterol studies are excellent. She was wondering about a PET scan which I do not think is indicated at this time. I do think she is a gynecological consultation because she is very estrogen deficient as far symptoms are concerned that she would like to remain sexually active. I have recommended that she talk to her surgeon at [facility] to get a consultation with one of their gynecologists there. It does sound as if systemic estrogens might help her symptomatically feel better. She has been through so many things in the last year and a half. I see no reason to see her in the future at this point in time but we will be here on standby should she need [place].

## 2013-01-16 NOTE — Assessment & Plan Note (Signed)
Chronic painless hoarseness, ENT to eval 

## 2013-01-16 NOTE — Assessment & Plan Note (Signed)
Currently experiencing acute headache, toradol and depo medrol administered in office

## 2013-01-16 NOTE — Assessment & Plan Note (Signed)
Annual wellness exam completed as documented. Pt intends to gradually increase her level of activity to improve overall health as well as to asist  with weight loss. Still having healing and closure of open wound from abdominal surgery in 2012, with concerns as to the possibilit of cancer as the reason surgery was done, however, pt now reports cells were never sent to pathology per record review, has upcoming appt with local oncologist

## 2013-01-19 ENCOUNTER — Inpatient Hospital Stay: Admission: RE | Admit: 2013-01-19 | Payer: Medicare Other | Source: Ambulatory Visit

## 2013-01-22 ENCOUNTER — Telehealth: Payer: Self-pay | Admitting: Family Medicine

## 2013-01-22 MED ORDER — CHLORPHENIRAMINE-HYDROCODONE 8-10 MG/5ML PO LQCR: 5.0000 mL | Freq: Two times a day (BID) | ORAL | Status: DC | PRN

## 2013-01-22 MED ORDER — ANTIPYRINE-BENZOCAINE 5.4-1.4 % OT SOLN: 3.0000 [drp] | Freq: Four times a day (QID) | OTIC | Status: DC | PRN

## 2013-01-22 NOTE — Telephone Encounter (Signed)
Pt called with cough with congestion, ear pressure and pain, sore throat since Friday, asked for antibiotic, cough medicines , medicine for ear pain. Advised her she needs to be seen by for her symptoms for antibiotics, she has a wound vac and cough is making high output per pt. Will send in a cough medicine, and AB otic until seen tomorrow. She is on chronic erythromycin per report for GI.

## 2013-01-23 ENCOUNTER — Telehealth: Payer: Self-pay | Admitting: Family Medicine

## 2013-01-23 ENCOUNTER — Encounter: Payer: Self-pay | Admitting: Family Medicine

## 2013-01-23 ENCOUNTER — Ambulatory Visit (INDEPENDENT_AMBULATORY_CARE_PROVIDER_SITE_OTHER): Payer: Medicare Other | Admitting: Family Medicine

## 2013-01-23 VITALS — BP 114/70 | HR 97 | Temp 98.9°F | Resp 18 | Ht 64.5 in | Wt 222.1 lb

## 2013-01-23 DIAGNOSIS — H6690 Otitis media, unspecified, unspecified ear: Secondary | ICD-10-CM

## 2013-01-23 DIAGNOSIS — J019 Acute sinusitis, unspecified: Secondary | ICD-10-CM

## 2013-01-23 DIAGNOSIS — J209 Acute bronchitis, unspecified: Secondary | ICD-10-CM

## 2013-01-23 DIAGNOSIS — H6691 Otitis media, unspecified, right ear: Secondary | ICD-10-CM

## 2013-01-23 HISTORY — DX: Otitis media, unspecified, right ear: H66.91

## 2013-01-23 MED ORDER — DOXYCYCLINE HYCLATE 100 MG PO TABS: 100.0000 mg | ORAL_TABLET | Freq: Two times a day (BID) | ORAL | Status: AC

## 2013-01-23 MED ORDER — FLUCONAZOLE 150 MG PO TABS: ORAL_TABLET | ORAL | Status: DC

## 2013-01-23 NOTE — Progress Notes (Signed)
  Subjective:    Patient ID: [name], female    DOB: 09-13-1961, 52 y.o.   MRN: [id number]  HPI  Acute illness 3 days ago, scratchy burning throat, right earache, cough, green nasal drainage, non productive cough. Pain and purulent drainage from right ear with abnormal sound in the ear Spouse has been ill 3 separate episodes of severe right ear pain and popping, with purulent bloody drainage and hearing loss  Review of Systems See HPI Denies recent fever has had  chills.  Denies chest pains, palpitations and leg swelling Denies abdominal pain, nausea, vomiting,diarrhea or constipation.   Denies dysuria, frequency, hesitancy or incontinence. Denies joint pain, swelling and limitation in mobility. Denies headaches, seizures, numbness, or tingling. Denies depression, anxiety or insomnia. Denies skin break down or rash.        Objective:   Physical Exam  Patient alert and oriented and in no cardiopulmonary distress.  HEENT: No facial asymmetry, EOMI, right maxillary sinus tenderness,  oropharynx pink and moist.  Neck supple right anterior cervical adenopathy.Right Tm erythematous, possible rupture to drum, left Tm normal  Chest: adequate air entry bilaterally , no wheezes, few scattered crackles.  CVS: S1, S2 no murmurs, no S3.  ABD: Soft non tender. Bowel sounds normal.  Ext: No edema  MS: Adequate ROM spine, shoulders, hips and knees.  Skin: Intact, no ulcerations or rash noted.  Psych: Good eye contact, normal affect. Memory intact not anxious or depressed appearing.  CNS: CN 2-12 intact, power, tone and sensation normal throughout.       Assessment & Plan:

## 2013-01-23 NOTE — Assessment & Plan Note (Signed)
Acute bronchitis , antibiotic prescribed, and pt to use oTC decongestant eg robitussin

## 2013-01-23 NOTE — Assessment & Plan Note (Signed)
Right ear pai with hearing loss and drainage, ENT evaluation in am, same arranged and pt is aware

## 2013-01-23 NOTE — Assessment & Plan Note (Signed)
Acute right maxillary sinusitis, antibiotics prescribed

## 2013-01-23 NOTE — Patient Instructions (Addendum)
F/u as before.  You are treated for acute right otitis and because you report hearing loss and drainage from the ear, I am attempting to get a sooner appt with ENT than 01/30, we will let you know.  10 day course of antibiotics prescribed, take as directed, also fluconazole, in the event of vaginal itch due to yeast infection. Tessalon perles nOT prescribed , possible cross reactivity with medication you are allergic to, so use robitussin as a decongestant

## 2013-01-24 NOTE — Telephone Encounter (Signed)
Noted. Will wait to see if patient needs further assistance.

## 2013-01-26 ENCOUNTER — Ambulatory Visit (INDEPENDENT_AMBULATORY_CARE_PROVIDER_SITE_OTHER): Payer: Medicare Other | Admitting: Otolaryngology

## 2013-01-26 DIAGNOSIS — H6980 Other specified disorders of Eustachian tube, unspecified ear: Secondary | ICD-10-CM

## 2013-01-26 DIAGNOSIS — H902 Conductive hearing loss, unspecified: Secondary | ICD-10-CM

## 2013-01-26 DIAGNOSIS — J0100 Acute maxillary sinusitis, unspecified: Secondary | ICD-10-CM

## 2013-01-31 ENCOUNTER — Ambulatory Visit
Admission: RE | Admit: 2013-01-31 | Discharge: 2013-01-31 | Disposition: A | Discharge status: Home / Self Care | Payer: Medicare Other | Source: Ambulatory Visit | Attending: Surgery | Admitting: Surgery

## 2013-01-31 DIAGNOSIS — Z853 Personal history of malignant neoplasm of breast: Secondary | ICD-10-CM

## 2013-01-31 MED ORDER — GADOBENATE DIMEGLUMINE 529 MG/ML IV SOLN
20.0000 mL | Freq: Once | INTRAVENOUS | Status: AC | PRN
  Administered 2013-01-31: 20 mL via INTRAVENOUS

## 2013-02-02 ENCOUNTER — Telehealth (INDEPENDENT_AMBULATORY_CARE_PROVIDER_SITE_OTHER): Payer: Self-pay | Admitting: General Surgery

## 2013-02-02 NOTE — Telephone Encounter (Signed)
Patient called for MR breast results. I reviewed report and made her aware it did not show malignancy or mention a leak in her implant but I would send this message for review with Dr Luisa Hart. She states she was told by radiology that it would be hard to see a leak in a saline implant on a scan and she is having change in the contour of her breast. Please call to discuss.

## 2013-02-14 ENCOUNTER — Telehealth: Payer: Self-pay | Admitting: Family Medicine

## 2013-02-14 DIAGNOSIS — J019 Acute sinusitis, unspecified: Secondary | ICD-10-CM

## 2013-02-14 DIAGNOSIS — J209 Acute bronchitis, unspecified: Secondary | ICD-10-CM

## 2013-02-15 ENCOUNTER — Other Ambulatory Visit: Payer: Self-pay | Admitting: Family Medicine

## 2013-02-15 DIAGNOSIS — J42 Unspecified chronic bronchitis: Secondary | ICD-10-CM

## 2013-02-15 MED ORDER — AZITHROMYCIN 250 MG PO TABS: ORAL_TABLET | ORAL | Status: AC

## 2013-02-15 MED ORDER — BENZONATATE 100 MG PO CAPS: 100.0000 mg | ORAL_CAPSULE | Freq: Four times a day (QID) | ORAL | Status: DC | PRN

## 2013-02-15 NOTE — Telephone Encounter (Signed)
Pt reports since yesterday low grade fever, chills, cough gets green sputum.\Tessalon perles, z pack sent in , she is aware. cXr ordered.  Please order she will have done in am cbc and diff, chem 7 stat  Also have sputum cup with order for sputum for c/s dx is bronchitis

## 2013-02-16 ENCOUNTER — Other Ambulatory Visit: Payer: Self-pay | Admitting: Family Medicine

## 2013-02-16 ENCOUNTER — Ambulatory Visit (HOSPITAL_COMMUNITY)
Admission: RE | Admit: 2013-02-16 | Discharge: 2013-02-16 | Disposition: A | Discharge status: Home / Self Care | Payer: Medicare Other | Source: Ambulatory Visit | Attending: Family Medicine | Admitting: Family Medicine

## 2013-02-16 DIAGNOSIS — R059 Cough, unspecified: Secondary | ICD-10-CM | POA: Insufficient documentation

## 2013-02-16 DIAGNOSIS — R05 Cough: Secondary | ICD-10-CM | POA: Insufficient documentation

## 2013-02-16 DIAGNOSIS — J42 Unspecified chronic bronchitis: Secondary | ICD-10-CM

## 2013-02-16 LAB — BASIC METABOLIC PANEL
Chloride: 100 mEq/L (ref 96–112)
Potassium: 4.7 mEq/L (ref 3.5–5.3)
Sodium: 137 mEq/L (ref 135–145)

## 2013-02-16 LAB — CBC WITH DIFFERENTIAL/PLATELET
Basophils Absolute: 0 10*3/uL (ref 0.0–0.1)
Basophils Relative: 0 % (ref 0–1)
HCT: 40.6 % (ref 36.0–46.0)
Lymphocytes Relative: 40 % (ref 12–46)
Monocytes Absolute: 0.3 10*3/uL (ref 0.1–1.0)
Neutro Abs: 3.1 10*3/uL (ref 1.7–7.7)
Neutrophils Relative %: 50 % (ref 43–77)
Platelets: 263 10*3/uL (ref 150–400)
RDW: 14.6 % (ref 11.5–15.5)
WBC: 6.2 10*3/uL (ref 4.0–10.5)

## 2013-02-16 NOTE — Telephone Encounter (Signed)
Patient aware that labs are ready in office for her to collect requisition.

## 2013-02-20 ENCOUNTER — Telehealth: Payer: Self-pay | Admitting: Family Medicine

## 2013-02-20 NOTE — Telephone Encounter (Signed)
Called and left message for patient to return call if she needs assistance.

## 2013-02-22 LAB — CREATININE WITH EST GFR
Creat: 1.07 mg/dL (ref 0.50–1.10)
GFR, Est African American: 69 mL/min
GFR, Est Non African American: 60 mL/min

## 2013-02-22 NOTE — Telephone Encounter (Signed)
Lab added and patient made aware.

## 2013-02-23 ENCOUNTER — Other Ambulatory Visit: Payer: Self-pay

## 2013-02-23 ENCOUNTER — Ambulatory Visit (INDEPENDENT_AMBULATORY_CARE_PROVIDER_SITE_OTHER): Payer: Medicare Other | Admitting: Otolaryngology

## 2013-02-23 MED ORDER — PRAVASTATIN SODIUM 80 MG PO TABS: ORAL_TABLET | ORAL | Status: DC

## 2013-02-23 MED ORDER — FENOFIBRATE 145 MG PO TABS: 145.0000 mg | ORAL_TABLET | Freq: Every day | ORAL | Status: DC

## 2013-02-23 MED ORDER — OMEPRAZOLE 40 MG PO CPDR: 40.0000 mg | DELAYED_RELEASE_CAPSULE | Freq: Every day | ORAL | Status: DC

## 2013-03-09 ENCOUNTER — Ambulatory Visit (INDEPENDENT_AMBULATORY_CARE_PROVIDER_SITE_OTHER): Payer: Medicare Other | Admitting: Otolaryngology

## 2013-03-09 DIAGNOSIS — R490 Dysphonia: Secondary | ICD-10-CM

## 2013-03-09 DIAGNOSIS — H6980 Other specified disorders of Eustachian tube, unspecified ear: Secondary | ICD-10-CM

## 2013-03-09 DIAGNOSIS — K219 Gastro-esophageal reflux disease without esophagitis: Secondary | ICD-10-CM

## 2013-03-20 ENCOUNTER — Telehealth: Payer: Self-pay | Admitting: Family Medicine

## 2013-03-20 NOTE — Telephone Encounter (Signed)
Advise and arrange nurse visit for toradol 60mg  Im for back and hip pain, please enter duration of pain and rate [pain in record

## 2013-03-20 NOTE — Telephone Encounter (Signed)
Nurse visit tomorrow, pls schedule

## 2013-03-22 ENCOUNTER — Ambulatory Visit (INDEPENDENT_AMBULATORY_CARE_PROVIDER_SITE_OTHER): Payer: Medicare Other

## 2013-03-22 ENCOUNTER — Telehealth: Payer: Self-pay

## 2013-03-22 VITALS — BP 126/78 | Wt 237.1 lb

## 2013-03-22 DIAGNOSIS — M549 Dorsalgia, unspecified: Secondary | ICD-10-CM

## 2013-03-22 MED ORDER — KETOROLAC TROMETHAMINE 60 MG/2ML IJ SOLN
60.0000 mg | Freq: Once | INTRAMUSCULAR | Status: AC
  Administered 2013-03-22: 60 mg via INTRAMUSCULAR

## 2013-03-22 NOTE — Telephone Encounter (Signed)
Hasn't been on phentermine for years. With her past h/o heart problems will hold off , encourage  Her to persist with exercise and calorie counting. Will discuss further at OV Offer 1500 cal diet sheet , and appt with nutritionist regularly one on one, she can also go to grp session, she ios prediabetic

## 2013-03-22 NOTE — Telephone Encounter (Signed)
Called and left message for pt

## 2013-03-22 NOTE — Telephone Encounter (Signed)
Pt coming in tomorrow

## 2013-03-22 NOTE — Progress Notes (Signed)
Patient in for Ketorolac Injection.  Injection on 60mg  given IM.  Patient complains of pain in back and legs.  She states that it worsens at night.  And rates it a 7 on 1-10 scale.  Injection given in right upper quadrant of gluteal muscle.  No sign or symptom of adverse reaction.  No voiced complaints.

## 2013-03-23 ENCOUNTER — Other Ambulatory Visit: Payer: Self-pay | Admitting: Family Medicine

## 2013-03-24 NOTE — Telephone Encounter (Signed)
Patient aware.

## 2013-03-27 ENCOUNTER — Encounter: Payer: Self-pay | Admitting: Family Medicine

## 2013-03-27 ENCOUNTER — Ambulatory Visit (INDEPENDENT_AMBULATORY_CARE_PROVIDER_SITE_OTHER): Payer: Medicare Other | Admitting: Family Medicine

## 2013-03-27 VITALS — BP 122/72 | HR 100 | Resp 18 | Ht 64.5 in | Wt 237.0 lb

## 2013-03-27 DIAGNOSIS — R519 Headache, unspecified: Secondary | ICD-10-CM

## 2013-03-27 DIAGNOSIS — E785 Hyperlipidemia, unspecified: Secondary | ICD-10-CM

## 2013-03-27 DIAGNOSIS — E039 Hypothyroidism, unspecified: Secondary | ICD-10-CM

## 2013-03-27 DIAGNOSIS — E8881 Metabolic syndrome: Secondary | ICD-10-CM

## 2013-03-27 DIAGNOSIS — F411 Generalized anxiety disorder: Secondary | ICD-10-CM

## 2013-03-27 DIAGNOSIS — R51 Headache: Secondary | ICD-10-CM

## 2013-03-27 DIAGNOSIS — IMO0001 Reserved for inherently not codable concepts without codable children: Secondary | ICD-10-CM

## 2013-03-27 DIAGNOSIS — R490 Dysphonia: Secondary | ICD-10-CM

## 2013-03-27 DIAGNOSIS — E669 Obesity, unspecified: Secondary | ICD-10-CM

## 2013-03-27 HISTORY — DX: Morbid (severe) obesity due to excess calories: E66.01

## 2013-03-27 MED ORDER — PHENTERMINE HCL 37.5 MG PO TABS: 37.5000 mg | ORAL_TABLET | Freq: Every day | ORAL | Status: DC

## 2013-03-27 NOTE — Patient Instructions (Addendum)
F/u in 2.5 month  HBA1C, chem 7,  TSH  Today  You are referred to nutritionist, and we will give you a 1200 cal diet sheet, pls alsogo to free diabetic class to learn to measure food portions  Start phentermine one daily  Weight loss goal of 5 pounds per month

## 2013-03-28 LAB — BASIC METABOLIC PANEL
BUN: 20 mg/dL (ref 6–23)
Calcium: 9.8 mg/dL (ref 8.4–10.5)
Creat: 1.1 mg/dL (ref 0.50–1.10)

## 2013-03-28 LAB — HEMOGLOBIN A1C
Hgb A1c MFr Bld: 5.8 % — ABNORMAL HIGH (ref <5.7)
Mean Plasma Glucose: 120 mg/dL — ABNORMAL HIGH (ref <117)

## 2013-03-28 LAB — TSH: TSH: 1.788 u[IU]/mL (ref 0.350–4.500)

## 2013-03-29 ENCOUNTER — Telehealth (HOSPITAL_COMMUNITY): Payer: Self-pay | Admitting: Dietician

## 2013-03-29 NOTE — Telephone Encounter (Addendum)
Called and left message on voicemail at [phone].

## 2013-03-29 NOTE — Telephone Encounter (Signed)
Received referral via fax from Dr. Lodema Hong for dx: obesity, hyperlipidemia. Noted pt also with Hgb A1c of 5.8 (indicative of prediabetes).

## 2013-03-30 ENCOUNTER — Emergency Department (HOSPITAL_COMMUNITY): Payer: Medicare Other

## 2013-03-30 ENCOUNTER — Encounter (HOSPITAL_COMMUNITY): Payer: Self-pay | Admitting: *Deleted

## 2013-03-30 ENCOUNTER — Emergency Department (HOSPITAL_COMMUNITY)
Admission: EM | Admit: 2013-03-30 | Discharge: 2013-03-31 | Disposition: A | Discharge status: Home / Self Care | Payer: Medicare Other | Attending: Internal Medicine | Admitting: Internal Medicine

## 2013-03-30 DIAGNOSIS — E7800 Pure hypercholesterolemia, unspecified: Secondary | ICD-10-CM | POA: Insufficient documentation

## 2013-03-30 DIAGNOSIS — R109 Unspecified abdominal pain: Secondary | ICD-10-CM | POA: Insufficient documentation

## 2013-03-30 DIAGNOSIS — Z8739 Personal history of other diseases of the musculoskeletal system and connective tissue: Secondary | ICD-10-CM | POA: Insufficient documentation

## 2013-03-30 DIAGNOSIS — Z8619 Personal history of other infectious and parasitic diseases: Secondary | ICD-10-CM | POA: Insufficient documentation

## 2013-03-30 DIAGNOSIS — Z8719 Personal history of other diseases of the digestive system: Secondary | ICD-10-CM | POA: Insufficient documentation

## 2013-03-30 DIAGNOSIS — Z8742 Personal history of other diseases of the female genital tract: Secondary | ICD-10-CM | POA: Insufficient documentation

## 2013-03-30 DIAGNOSIS — Z86718 Personal history of other venous thrombosis and embolism: Secondary | ICD-10-CM | POA: Insufficient documentation

## 2013-03-30 DIAGNOSIS — L299 Pruritus, unspecified: Secondary | ICD-10-CM | POA: Insufficient documentation

## 2013-03-30 DIAGNOSIS — Z86711 Personal history of pulmonary embolism: Secondary | ICD-10-CM | POA: Insufficient documentation

## 2013-03-30 DIAGNOSIS — F329 Major depressive disorder, single episode, unspecified: Secondary | ICD-10-CM | POA: Insufficient documentation

## 2013-03-30 DIAGNOSIS — F3289 Other specified depressive episodes: Secondary | ICD-10-CM | POA: Insufficient documentation

## 2013-03-30 DIAGNOSIS — Z853 Personal history of malignant neoplasm of breast: Secondary | ICD-10-CM | POA: Insufficient documentation

## 2013-03-30 DIAGNOSIS — Z8673 Personal history of transient ischemic attack (TIA), and cerebral infarction without residual deficits: Secondary | ICD-10-CM | POA: Insufficient documentation

## 2013-03-30 DIAGNOSIS — F411 Generalized anxiety disorder: Secondary | ICD-10-CM | POA: Insufficient documentation

## 2013-03-30 DIAGNOSIS — R110 Nausea: Secondary | ICD-10-CM | POA: Insufficient documentation

## 2013-03-30 DIAGNOSIS — Z9889 Other specified postprocedural states: Secondary | ICD-10-CM | POA: Insufficient documentation

## 2013-03-30 DIAGNOSIS — Z90710 Acquired absence of both cervix and uterus: Secondary | ICD-10-CM | POA: Insufficient documentation

## 2013-03-30 DIAGNOSIS — E079 Disorder of thyroid, unspecified: Secondary | ICD-10-CM | POA: Insufficient documentation

## 2013-03-30 DIAGNOSIS — Z79899 Other long term (current) drug therapy: Secondary | ICD-10-CM | POA: Insufficient documentation

## 2013-03-30 DIAGNOSIS — K219 Gastro-esophageal reflux disease without esophagitis: Secondary | ICD-10-CM | POA: Insufficient documentation

## 2013-03-30 DIAGNOSIS — Z8679 Personal history of other diseases of the circulatory system: Secondary | ICD-10-CM | POA: Insufficient documentation

## 2013-03-30 DIAGNOSIS — Z87448 Personal history of other diseases of urinary system: Secondary | ICD-10-CM | POA: Insufficient documentation

## 2013-03-30 DIAGNOSIS — Z7982 Long term (current) use of aspirin: Secondary | ICD-10-CM | POA: Insufficient documentation

## 2013-03-30 DIAGNOSIS — Z8701 Personal history of pneumonia (recurrent): Secondary | ICD-10-CM | POA: Insufficient documentation

## 2013-03-30 DIAGNOSIS — D649 Anemia, unspecified: Secondary | ICD-10-CM | POA: Insufficient documentation

## 2013-03-30 LAB — URINALYSIS, ROUTINE W REFLEX MICROSCOPIC
Glucose, UA: NEGATIVE mg/dL
Leukocytes, UA: NEGATIVE
Nitrite: NEGATIVE
Protein, ur: NEGATIVE mg/dL
Urobilinogen, UA: 0.2 mg/dL (ref 0.0–1.0)

## 2013-03-30 LAB — COMPREHENSIVE METABOLIC PANEL
AST: 21 U/L (ref 0–37)
CO2: 23 mEq/L (ref 19–32)
Calcium: 9.6 mg/dL (ref 8.4–10.5)
Creatinine, Ser: 1.01 mg/dL (ref 0.50–1.10)
GFR calc Af Amer: 73 mL/min — ABNORMAL LOW (ref >90)
GFR calc non Af Amer: 63 mL/min — ABNORMAL LOW (ref >90)

## 2013-03-30 LAB — CBC WITH DIFFERENTIAL/PLATELET
Basophils Absolute: 0 10*3/uL (ref 0.0–0.1)
Eosinophils Relative: 1 % (ref 0–5)
Hemoglobin: 12.7 g/dL (ref 12.0–15.0)
Lymphocytes Relative: 25 % (ref 12–46)
Monocytes Absolute: 0.4 10*3/uL (ref 0.1–1.0)
RBC: 4.43 MIL/uL (ref 3.87–5.11)
RDW: 13.8 % (ref 11.5–15.5)

## 2013-03-30 LAB — LACTIC ACID, PLASMA: Lactic Acid, Venous: 1.5 mmol/L (ref 0.5–2.2)

## 2013-03-30 MED ORDER — SODIUM CHLORIDE 0.9 % IV SOLN
INTRAVENOUS | Status: DC
  Administered 2013-03-31: via INTRAVENOUS

## 2013-03-30 MED ORDER — IOHEXOL 300 MG/ML  SOLN: 50.0000 mL | Freq: Once | INTRAMUSCULAR | Status: AC | PRN

## 2013-03-30 MED ORDER — HYDROMORPHONE HCL PF 2 MG/ML IJ SOLN
2.0000 mg | Freq: Once | INTRAMUSCULAR | Status: AC
  Administered 2013-03-30: 2 mg via INTRAVENOUS
  Filled 2013-03-30: qty 1

## 2013-03-30 MED ORDER — SODIUM CHLORIDE 0.9 % IV BOLUS (SEPSIS)
1000.0000 mL | Freq: Once | INTRAVENOUS | Status: AC
  Administered 2013-03-30: 1000 mL via INTRAVENOUS

## 2013-03-30 MED ORDER — DIPHENHYDRAMINE HCL 50 MG/ML IJ SOLN
50.0000 mg | Freq: Once | INTRAMUSCULAR | Status: AC
  Administered 2013-03-31: 50 mg via INTRAVENOUS
  Filled 2013-03-30: qty 1

## 2013-03-30 MED ORDER — ONDANSETRON HCL 4 MG/2ML IJ SOLN
4.0000 mg | Freq: Once | INTRAMUSCULAR | Status: AC
  Administered 2013-03-30: 4 mg via INTRAVENOUS
  Filled 2013-03-30: qty 2

## 2013-03-30 NOTE — ED Provider Notes (Signed)
History  This chart was scribed for Hurman Horn, MD by Bennett Scrape, ED Scribe. This patient was seen in room APA10/APA10 and the patient's care was started at 10:18 PM.  CSN: [id number]  Arrival date & time 03/30/13  2102   First MD Initiated Contact with Patient 03/30/13 2218      Chief Complaint  Patient presents with  . Abdominal Pain     The history is provided by the patient. No language interpreter was used.    [name] is a 52 y.o. female who presents to the Emergency Department complaining of gradual onset one day, gradually worsening, waxing and waning generalized abdominal pain that radiates into her back described as cramping with associated nausea. She reports that 2 years ago she had an exploratory surgery where she was cut from her chest to her vagina and from hip to hip at [facility] after having an elevated CEA125. She states that no ovarian cancer was found during the surgery and she denies any formal diagnosis of ovarian cancer. She reports several complications from the surgery including a perforated bowel. She reports that she then voluntarily switched to [facility] for the repair surgery. She states that she vomited after the surgery and her abdomen dehisced. She states that they are trying to slowly close the abdomen over the last 2 years.She denies having chronic abdominal pain daily and denies that she is on pain medication. She reports that she takes stool softeners daily to in order to have chronic soft and loose stools but states that occasionally she gets partial bowel blockages with similar symptoms. She states that she tried an enema 30 minutes ago and had a small BM but reports that the pain increased afterwards. She denies emesis, fever, CP, hematochezia, urinary symptoms and confusion as associated symptoms.   Past Medical History  Diagnosis Date  . SVT (supraventricular tachycardia)   . Hypercholesterolemia   . Bilateral ovarian cysts   . Perforation  bowel   . PE (pulmonary embolism)   . Thyroid disease   . GERD (gastroesophageal reflux disease)   . Sepsis(995.91)   . E coli infection   . Anxiety   . Pneumonia   . Depression   . Carpal tunnel syndrome   . DVT (deep venous thrombosis)   . Anemia   . Irregular heartbeat   . Stroke   . DVT (deep venous thrombosis)   . Blood transfusion without reported diagnosis   . Clotting disorder   . Fibromyalgia   . Hx of migraines   . Interstitial cystitis   . Cancer     left breast   . Acute renal failure     renal faliure with surgery     Past Surgical History  Procedure Laterality Date  . Heart ablation    . Vena cava filter placement    . Abdominal hysterectomy    . Hernia repair    . Bowel resection    . Carpal tunnel release    . Bowel fistula    . Breast enhancement surgery  1983  . Removal of breast implants  1993  . Cesarean section  1986  . Colon surgery    . Cosmetic surgery      Family History  Problem Relation Age of Onset  . Diabetes Mother   . Heart disease Mother   . Hypertension Mother   . Heart disease Father   . Hyperlipidemia Father   . Hypertension Father   . Cancer Maternal  Aunt     colon  . Cancer Maternal Uncle     mets  . Cancer Maternal Grandfather     bone/mets  . Cancer Cousin 19    ovarian  . Cancer Maternal Uncle     prostate    History  Substance Use Topics  . Smoking status: Never Smoker   . Smokeless tobacco: Never Used  . Alcohol Use: No     Comment: rare    No OB history provided.  Review of Systems  10 Systems reviewed and all are negative for acute change except as noted in the HPI.   Allergies  Clarithromycin; Clarithromycin; Clindamycin hcl; Codeine; Keflex; Nitrofurantoin; Penicillins; Scopolamine hbr; Sulfonamide derivatives; and Tape  Home Medications   Current Outpatient Rx  Name  Route  Sig  Dispense  Refill  . ALPRAZolam (XANAX) 1 MG tablet      1 mg. Take 1 mg by mouth 4 (four) times daily.          Marland Kitchen aspirin EC 81 MG tablet   Oral   Take 81 mg by mouth daily.         . Biotin 5000 MCG CAPS   Oral   Take 1 capsule by mouth daily.         . cyclobenzaprine (FLEXERIL) 10 MG tablet   Oral   Take 10 mg by mouth 3 (three) times daily as needed for muscle spasms.         . fenofibrate (TRICOR) 145 MG tablet   Oral   Take 1 tablet (145 mg total) by mouth daily.   90 tablet   1   . levothyroxine (SYNTHROID) 125 MCG tablet   Oral   Take 1 tablet (125 mcg total) by mouth daily.   90 tablet   1     Please send asap   . Multiple Vitamins tablet   Oral   Take 1 tablet by mouth daily. Take 1 tablet by mouth daily.         . Multiple Vitamins-Minerals (ICAPS) CAPS   Oral   Take 1 capsule by mouth daily.         Marland Kitchen omeprazole (PRILOSEC) 40 MG capsule   Oral   Take 1 capsule (40 mg total) by mouth daily.   90 capsule   1   . polyethylene glycol (MIRALAX / GLYCOLAX) packet      Take 17 g by mouth 2 (two) times daily. Mix in 4-8ounces of fluid prior to taking.         . pravastatin (PRAVACHOL) 80 MG tablet   Oral   Take 80 mg by mouth at bedtime.         Marland Kitchen PREMARIN vaginal cream   Vaginal   Place 1 application vaginally Daily.         Marland Kitchen senna-docusate (SENOKOT-S) 8.6-50 MG per tablet   Oral   Take 2 tablets by mouth 2 (two) times daily.         . verapamil (CALAN) 120 MG tablet   Oral   Take 120 mg by mouth daily.         . vitamin C (ASCORBIC ACID) 500 MG tablet   Oral   Take 500 mg by mouth daily.         . butalbital-acetaminophen-caffeine (FIORICET, ESGIC) 50-325-40 MG per tablet   Oral   Take 1 tablet by mouth 2 (two) times daily as needed. For migraines Please deliver   14 tablet  1   . oxyCODONE-acetaminophen (PERCOCET) 5-325 MG per tablet   Oral   Take 2 tablets by mouth every 4 (four) hours as needed for pain. Dispense to go   6 tablet   0   . phentermine (ADIPEX-P) 37.5 MG tablet   Oral   Take 1 tablet (37.5 mg  total) by mouth daily before breakfast.   30 tablet   2     Triage Vitals: BP 125/87  Pulse 102  Temp(Src) 97.7 F (36.5 C) (Oral)  Resp 18  Ht 5' 4.5" (1.638 m)  Wt 226 lb (102.513 kg)  BMI 38.21 kg/m2  SpO2 98%  Physical Exam  Nursing note and vitals reviewed. Constitutional:  Awake, alert, nontoxic appearance with baseline speech for patient.  HENT:  Head: Atraumatic.  Mouth/Throat: No oropharyngeal exudate.  Eyes: EOM are normal. Pupils are equal, round, and reactive to light. Right eye exhibits no discharge. Left eye exhibits no discharge.  Neck: Neck supple.  Cardiovascular: Normal rate and regular rhythm.   No murmur heard. Pulmonary/Chest: Effort normal and breath sounds normal. No stridor. No respiratory distress. She has no wheezes. She has no rales. She exhibits no tenderness.  Abdominal: Soft. Bowel sounds are normal. She exhibits no distension and no mass. There is tenderness (moderate epigastrium and left-sided tenderness, mild right abdominal tenderness). There is no rebound and no guarding.  Mild to moderate diffuse tenderness without rebound, she is a chronic open abdominal wall wound with dressing in place without cellulitis noted  Musculoskeletal: She exhibits no tenderness.  Baseline ROM, moves extremities with no obvious new focal weakness.  Lymphadenopathy:    She has no cervical adenopathy.  Neurological: She is alert.  Awake, alert, cooperative and aware of situation; motor strength bilaterally; sensation normal to light touch bilaterally  Skin: No rash noted.  Psychiatric: She has a normal mood and affect.    ED Course  Procedures (including critical care time)  DIAGNOSTIC STUDIES: Oxygen Saturation is 98% on room air, normal by my interpretation.    COORDINATION OF CARE: 10:30 PM- Patient understands and agrees with initial ED impression and plan with expectations set for ED visit. Discussed pain medications and CT of abdomen with pt and pt  agreed.  Patient and husband aware of CT findings and recommendation for followup at [facility] today since patient prefers discharge with clear liquids at home.[id number]   Labs Reviewed  COMPREHENSIVE METABOLIC PANEL - Abnormal; Notable for the following:    Glucose, Bld 109 (*)    GFR calc non Af Amer 63 (*)    GFR calc Af Amer 73 (*)    All other components within normal limits  CBC WITH DIFFERENTIAL  LIPASE, BLOOD  URINALYSIS, ROUTINE W REFLEX MICROSCOPIC  LACTIC ACID, PLASMA   No results found.   1. Abdominal pain       MDM  I personally performed the services described in this documentation, which was scribed in my presence. The recorded information has been reviewed and is accurate.    Hurman Horn, MD 04/10/13 [phone]

## 2013-03-30 NOTE — ED Notes (Signed)
Pt with severe abd cramping off and on today, very little BM this morning, nausea but denies vomiting

## 2013-03-30 NOTE — ED Notes (Signed)
Patient ambulatory to restroom. Family to assist patient.

## 2013-03-30 NOTE — ED Notes (Signed)
abd pain,  Had bm this am,  But felt she needed to have another bm  ,  Took an enema. And had increase in pain.  Had surgery in March to repair a fistula.at [facility].  Plans a surgery at [facility] .  Nausea, no vomiting

## 2013-03-31 MED ORDER — IOHEXOL 300 MG/ML  SOLN
100.0000 mL | Freq: Once | INTRAMUSCULAR | Status: AC | PRN
  Administered 2013-03-31: 100 mL via INTRAVENOUS

## 2013-03-31 MED ORDER — FENTANYL CITRATE 0.05 MG/ML IJ SOLN
100.0000 ug | Freq: Once | INTRAMUSCULAR | Status: AC
  Administered 2013-03-31: 100 ug via INTRAVENOUS
  Filled 2013-03-31: qty 2

## 2013-03-31 MED ORDER — OXYCODONE-ACETAMINOPHEN 5-325 MG PO TABS: 2.0000 | ORAL_TABLET | ORAL | Status: DC | PRN

## 2013-03-31 MED ORDER — IOHEXOL 300 MG/ML  SOLN
50.0000 mL | Freq: Once | INTRAMUSCULAR | Status: AC | PRN
  Administered 2013-03-31: 50 mL via ORAL

## 2013-03-31 NOTE — ED Notes (Signed)
Patient complaining of worsening pain. Advised MD.

## 2013-03-31 NOTE — ED Notes (Signed)
Patient complaining of generalized itching. States "the dilaudid always makes me itch."

## 2013-04-03 NOTE — Telephone Encounter (Signed)
Pt did not respond to first contact attempt. Sent letter to pt home via [place] [employer] in attempt to contact pt to schedule appointment.

## 2013-04-06 ENCOUNTER — Other Ambulatory Visit: Payer: Self-pay | Admitting: Family Medicine

## 2013-04-06 ENCOUNTER — Telehealth: Payer: Self-pay | Admitting: Family Medicine

## 2013-04-06 ENCOUNTER — Ambulatory Visit (HOSPITAL_COMMUNITY)
Admission: RE | Admit: 2013-04-06 | Discharge: 2013-04-06 | Disposition: A | Discharge status: Home / Self Care | Payer: Medicare Other | Source: Ambulatory Visit | Attending: Family Medicine | Admitting: Family Medicine

## 2013-04-06 DIAGNOSIS — M25559 Pain in unspecified hip: Secondary | ICD-10-CM | POA: Insufficient documentation

## 2013-04-06 DIAGNOSIS — M25552 Pain in left hip: Secondary | ICD-10-CM

## 2013-04-06 NOTE — Telephone Encounter (Signed)
I spoke with pt, xray of hip is ordered, she knows, pls refer to Dr Cruz Condon, referral is entered, she knows of the referral, thanks!

## 2013-04-09 NOTE — Assessment & Plan Note (Signed)
Unchanged, fioricet provides relief

## 2013-04-09 NOTE — Assessment & Plan Note (Signed)
Fair pain control, uses muscle relaxant as needed for flares

## 2013-04-09 NOTE — Assessment & Plan Note (Signed)
Unchanged Pt to start phentermine , which has helped her in the past with weight loss, hse has recently started walking though painful

## 2013-04-09 NOTE — Assessment & Plan Note (Signed)
Deteriorated. Patient re-educated about  the importance of commitment to a  minimum of 150 minutes of exercise per week. The importance of healthy food choices with portion control discussed. Encouraged to start a food diary, count calories and to consider  joining a support group. Sample diet sheets offered. Goals set by the patient for the next several months.    

## 2013-04-09 NOTE — Assessment & Plan Note (Signed)
Hyperlipidemia:Low fat diet discussed and encouraged.   

## 2013-04-09 NOTE — Progress Notes (Signed)
  Subjective:    Patient ID: [name], female    DOB: Aug 08, 1961, 52 y.o.   MRN: [id number]  HPI Pt in for the main reason that she wishes to resume phentermine to help with weight loss, states that despite cutting back on caloric intake and increased physical activity States this has helped in the past, no respiratory problems, tachycardia or insomnia. She has no CAD Motivated to cause as she is very aware of adverse effects of obesity and wants to avoid as much as possible Abdominal wound is still open form GI surgery nearly 2 years ago, this is being managed at [facility], still has a colostomy    Review of Systems See HPI Denies recent fever or chills. Denies sinus pressure, nasal congestion, ear pain or sore throat. Denies chest congestion, productive cough or wheezing. Denies chest pains, palpitations and leg swelling Denies abdominal pain, nausea, vomiting,diarrhea or constipation.   Denies dysuria, frequency, hesitancy or incontinence. Denies joint pain, swelling and limitation in mobility. Denies uncontrolled  headaches, seizures, numbness, or tingling. Denies depression, anxiety or insomnia. Denies skin break down or rash.        Objective:   Physical Exam  Patient alert and oriented and in no cardiopulmonary distress.Obese  HEENT: No facial asymmetry, EOMI, no sinus tenderness,  oropharynx pink and moist.  Neck supple no adenopathy.  Chest: Clear to auscultation bilaterally.  CVS: S1, S2 no murmurs, no S3.  ABD: Soft non tender. Bowel sounds normal.  Ext: No edema  MS: Adequate ROM spine, shoulders, hips and knees.  Skin: Intact, no ulcerations or rash noted.  Psych: Good eye contact, normal affect. Memory intact not anxious or depressed appearing.  CNS: CN 2-12 intact, power, tone and sensation normal throughout.       Assessment & Plan:

## 2013-04-09 NOTE — Assessment & Plan Note (Signed)
Being treated for gERD by ENT

## 2013-04-09 NOTE — Assessment & Plan Note (Signed)
Controlled, no change in medication  

## 2013-04-10 NOTE — Telephone Encounter (Signed)
Pt has not responded to attempts to contact to schedule appointment. Referral filed.  

## 2013-04-10 NOTE — Telephone Encounter (Signed)
Noted  

## 2013-04-24 ENCOUNTER — Telehealth: Payer: Self-pay | Admitting: Family Medicine

## 2013-04-24 NOTE — Telephone Encounter (Signed)
Please advise.  Does patient need appt first.

## 2013-04-25 ENCOUNTER — Other Ambulatory Visit: Payer: Self-pay | Admitting: Family Medicine

## 2013-04-25 ENCOUNTER — Telehealth: Payer: Self-pay | Admitting: Family Medicine

## 2013-04-25 MED ORDER — HYDROCODONE-HOMATROPINE 5-1.5 MG/5ML PO SYRP: 5.0000 mL | ORAL_SOLUTION | Freq: Three times a day (TID) | ORAL | Status: AC | PRN

## 2013-04-25 NOTE — Telephone Encounter (Signed)
Called and left msg on both recorded #s.  Will await call back.

## 2013-04-25 NOTE — Telephone Encounter (Signed)
Get the stitches fixed please call in some tussionex to [facility]

## 2013-04-25 NOTE — Telephone Encounter (Signed)
She needs an office visit, she can be added to my schedule one day this week Pls document more of a triage.. has had fever chills, presssure , symptom duration.Marland Kitchenibuprofenf less than 3 days then I advise use of nasal decongestants and sprays. May not need antibiotic at this time, if not, no need to come in If triage documentation supports office visit, then sched as above pls

## 2013-04-26 ENCOUNTER — Ambulatory Visit: Payer: Medicare Other | Admitting: Orthopedic Surgery

## 2013-04-26 NOTE — Telephone Encounter (Signed)
Physician left message with patient

## 2013-05-01 ENCOUNTER — Ambulatory Visit (INDEPENDENT_AMBULATORY_CARE_PROVIDER_SITE_OTHER): Payer: Medicare Other | Admitting: Family Medicine

## 2013-05-01 ENCOUNTER — Encounter: Payer: Self-pay | Admitting: Family Medicine

## 2013-05-01 ENCOUNTER — Other Ambulatory Visit: Payer: Self-pay

## 2013-05-01 ENCOUNTER — Telehealth: Payer: Self-pay | Admitting: Family Medicine

## 2013-05-01 VITALS — BP 114/82 | HR 80 | Temp 98.7°F | Resp 16 | Wt 237.1 lb

## 2013-05-01 DIAGNOSIS — M549 Dorsalgia, unspecified: Secondary | ICD-10-CM

## 2013-05-01 DIAGNOSIS — J019 Acute sinusitis, unspecified: Secondary | ICD-10-CM

## 2013-05-01 DIAGNOSIS — J209 Acute bronchitis, unspecified: Secondary | ICD-10-CM

## 2013-05-01 DIAGNOSIS — E039 Hypothyroidism, unspecified: Secondary | ICD-10-CM

## 2013-05-01 HISTORY — DX: Acute bronchitis, unspecified: J20.9

## 2013-05-01 MED ORDER — FLUCONAZOLE 150 MG PO TABS: ORAL_TABLET | ORAL | Status: DC

## 2013-05-01 MED ORDER — KETOROLAC TROMETHAMINE 60 MG/2ML IM SOLN
60.0000 mg | Freq: Once | INTRAMUSCULAR | Status: AC
  Administered 2013-05-01: 60 mg via INTRAMUSCULAR

## 2013-05-01 MED ORDER — METHYLPREDNISOLONE ACETATE 80 MG/ML IJ SUSP
80.0000 mg | Freq: Once | INTRAMUSCULAR | Status: AC
  Administered 2013-05-01: 80 mg via INTRAMUSCULAR

## 2013-05-01 MED ORDER — AZITHROMYCIN 250 MG PO TABS: ORAL_TABLET | ORAL | Status: DC

## 2013-05-01 MED ORDER — PREDNISONE 5 MG PO TABS: 5.0000 mg | ORAL_TABLET | Freq: Two times a day (BID) | ORAL | Status: AC

## 2013-05-01 MED ORDER — BECLOMETHASONE DIPROPIONATE 40 MCG/ACT IN AERS: 2.0000 | INHALATION_SPRAY | Freq: Two times a day (BID) | RESPIRATORY_TRACT | Status: DC

## 2013-05-01 NOTE — Assessment & Plan Note (Signed)
Controlled, no change in medication  

## 2013-05-01 NOTE — Telephone Encounter (Signed)
Will send in 

## 2013-05-01 NOTE — Telephone Encounter (Signed)
Sent in

## 2013-05-01 NOTE — Patient Instructions (Addendum)
F/ui as before.  You are being treated for uncontrolled allergic cough and bronchitis.  cXR today please.  Please take green sputum to lab for culture as soon as possible.  Toradol 60mg  IM and depomedrol 80mg  IM in office today for back pain and cough   Prednisone, z pack and qvar are prescribed to treat cough  Take OTC sudafed one daily for the next 5 days to reduce drainage form sinuses.  Please call if no better or you worsen

## 2013-05-01 NOTE — Assessment & Plan Note (Signed)
Deterioration in respiratory symptoms despite levaquin x 1 week. CXR today, start z pack also prednisone . Sudafed short term also

## 2013-05-01 NOTE — Assessment & Plan Note (Signed)
Uncontrolled due to excessive cough. Toradol and depo medrol in office today

## 2013-05-01 NOTE — Assessment & Plan Note (Signed)
Deteriorated. Patient re-educated about  the importance of commitment to a  minimum of 150 minutes of exercise per week. The importance of healthy food choices with portion control discussed. Encouraged to start a food diary, count calories and to consider  joining a support group. Sample diet sheets offered. Goals set by the patient for the next several months.    

## 2013-05-01 NOTE — Progress Notes (Signed)
  Subjective:    Patient ID: [name], female    DOB: August 14, 1961, 52 y.o.   MRN: [id number]  HPI 8 day h/o head congestion and very disruptive cough, all day and nigh, also lost her voice. Had a temp up to 102 on 4/27, went to the urgent care was prescribed,flonasee and levaquin and told she had pneumonia. 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 Catheter becam partly dislodged last week due to cough had to be re positioned at [facility] C/o increased back pain due to uncontrolled and excessive cough, she is coughing with minimal activity   Review of Systems See HPI . Denies chest pains, palpitations and leg swelling Denies abdominal pain, nausea, vomiting,diarrhea or constipation.   Denies dysuria, frequency, hesitancy or incontinence.  Denies  seizures, numbness, or tingling. Denies depression, anxiety or insomnia. Denies skin break down or rash.        Objective:   Physical Exam  Patient alert and oriented and in no cardiopulmonary distress.  HEENT: No facial asymmetry, EOMI, no sinus tenderness,  oropharynx pink and moist.  Neck supple no adenopathy.TM clear. Hoarse, with "nasal" speech  Chest: decreased air entry, bilateral crackles, no wheezes CVS: S1, S2 no murmurs, no S3.  ABD: Soft non tender. Bowel sounds normal.  Ext: No edema  MS: Adequate ROM spine, shoulders, hips and knees.  Skin: Intact, no ulcerations or rash noted.  Psych: Good eye contact, normal affect. Memory intact not anxious or depressed appearing.  CNS: CN 2-12 intact, power, tone and sensation normal throughout.       Assessment & Plan:

## 2013-05-02 ENCOUNTER — Ambulatory Visit (HOSPITAL_COMMUNITY)
Admission: RE | Admit: 2013-05-02 | Discharge: 2013-05-02 | Disposition: A | Discharge status: Home / Self Care | Payer: Medicare Other | Source: Ambulatory Visit | Attending: Family Medicine | Admitting: Family Medicine

## 2013-05-02 DIAGNOSIS — R059 Cough, unspecified: Secondary | ICD-10-CM | POA: Insufficient documentation

## 2013-05-02 DIAGNOSIS — R05 Cough: Secondary | ICD-10-CM | POA: Insufficient documentation

## 2013-05-02 DIAGNOSIS — J209 Acute bronchitis, unspecified: Secondary | ICD-10-CM

## 2013-05-10 ENCOUNTER — Ambulatory Visit: Payer: Medicare Other | Admitting: Family Medicine

## 2013-05-10 ENCOUNTER — Ambulatory Visit (INDEPENDENT_AMBULATORY_CARE_PROVIDER_SITE_OTHER): Payer: Medicare Other | Admitting: Family Medicine

## 2013-05-10 ENCOUNTER — Encounter: Payer: Self-pay | Admitting: Family Medicine

## 2013-05-10 VITALS — BP 104/78 | HR 100 | Resp 20 | Ht 64.5 in | Wt 236.1 lb

## 2013-05-10 DIAGNOSIS — K219 Gastro-esophageal reflux disease without esophagitis: Secondary | ICD-10-CM | POA: Insufficient documentation

## 2013-05-10 DIAGNOSIS — E785 Hyperlipidemia, unspecified: Secondary | ICD-10-CM

## 2013-05-10 DIAGNOSIS — R05 Cough: Secondary | ICD-10-CM

## 2013-05-10 DIAGNOSIS — R053 Chronic cough: Secondary | ICD-10-CM | POA: Insufficient documentation

## 2013-05-10 DIAGNOSIS — J302 Other seasonal allergic rhinitis: Secondary | ICD-10-CM | POA: Insufficient documentation

## 2013-05-10 DIAGNOSIS — N760 Acute vaginitis: Secondary | ICD-10-CM

## 2013-05-10 DIAGNOSIS — E039 Hypothyroidism, unspecified: Secondary | ICD-10-CM

## 2013-05-10 DIAGNOSIS — R059 Cough, unspecified: Secondary | ICD-10-CM

## 2013-05-10 DIAGNOSIS — J309 Allergic rhinitis, unspecified: Secondary | ICD-10-CM

## 2013-05-10 HISTORY — DX: Acute vaginitis: N76.0

## 2013-05-10 MED ORDER — FLUCONAZOLE 150 MG PO TABS: ORAL_TABLET | ORAL | Status: AC

## 2013-05-10 MED ORDER — PREDNISONE 5 MG PO TABS: 5.0000 mg | ORAL_TABLET | Freq: Two times a day (BID) | ORAL | Status: AC

## 2013-05-10 MED ORDER — HYDROCODONE-HOMATROPINE 5-1.5 MG/5ML PO SYRP: 5.0000 mL | ORAL_SOLUTION | Freq: Four times a day (QID) | ORAL | Status: DC | PRN

## 2013-05-10 NOTE — Patient Instructions (Addendum)
F/u as before, call if you need me before.  You need 2 month f/u   Fluconazole , prednisone and hycodan are prescribed  You are refered to dr Karilyn Cota, also to pulmonary specialist in [place]  All the best with your surgery

## 2013-05-10 NOTE — Progress Notes (Signed)
  Subjective:    Patient ID: [name], female    DOB: October 28, 1961, 52 y.o.   MRN: [id number]  HPI C/o chronic cough, for several weeks, no fever , chills or sputum, feels as though there is "congestrion " in her lungs which "needs to come up"Has coughing spasms which interfere with her sleep and are causing chest wall pain Denies sinus pressure, sore throat or post nasal drainage. Has upcoming surgey in next several days, anxious to have this addressed   Review of Systems See HPI Denies recent fever or chills. Denies sinus pressure, nasal congestion, ear pain or sore throat.  Denies PND, orthopnea, palpitations and leg swelling Denies abdominal pain, nausea, vomiting,diarrhea or constipation. Does experience reflux which seems to be worsening  Denies dysuria, frequency, hesitancy or incontinence. Denies joint pain, swelling and limitation in mobility. Denies headaches, seizures, numbness, or tingling. Denies depression, anxiety or insomnia. Denies skin break down or rash.        Objective:   Physical Exam  Patient alert and oriented and in no cardiopulmonary distress.  HEENT: No facial asymmetry, EOMI, no sinus tenderness,  oropharynx pink and moist.  Neck supple no adenopathy.  Chest: Clear to auscultation bilaterally.Adequate air entry throughout Reproducible anterior chest wall pain  CVS: S1, S2 no murmurs, no S3.  ABD: Soft mil epigastric tenderness. Bowel sounds normal.Colostomy functional, surgical scar healing well  Ext: No edema  MS: Adequate ROM spine, shoulders, hips and knees.  Skin: Intact, no ulcerations or rash noted.  Psych: Good eye contact, normal affect. Memory intact not anxious or depressed appearing.  CNS: CN 2-12 intact, power, tone and sensation normal throughout.       Assessment & Plan:

## 2013-05-11 ENCOUNTER — Ambulatory Visit (INDEPENDENT_AMBULATORY_CARE_PROVIDER_SITE_OTHER): Payer: Medicare Other | Admitting: Otolaryngology

## 2013-05-22 NOTE — Assessment & Plan Note (Signed)
Anticipate vaginal itch with steroid, fluconazole prescribed

## 2013-05-22 NOTE — Assessment & Plan Note (Signed)
Uncontrolled, continue allergy meds, add short course of steroids

## 2013-05-22 NOTE — Assessment & Plan Note (Signed)
Controlled, no change in medication  

## 2013-05-22 NOTE — Assessment & Plan Note (Signed)
Uncontrolled, refer for GI eval

## 2013-05-22 NOTE — Assessment & Plan Note (Signed)
Post infectious cough which is bothersome, likely has an allergic componenet as well. Recent CXR showed no infiltrate. Will refer to pulmonary for further eval Cough suppressant and short course of steroid

## 2013-05-22 NOTE — Assessment & Plan Note (Signed)
Hyperlipidemia:Low fat diet discussed and encouraged.  LDL elevated, no med change

## 2013-05-25 ENCOUNTER — Ambulatory Visit (INDEPENDENT_AMBULATORY_CARE_PROVIDER_SITE_OTHER): Payer: Medicare Other | Admitting: Internal Medicine

## 2013-05-26 ENCOUNTER — Other Ambulatory Visit: Payer: Self-pay | Admitting: Family Medicine

## 2013-06-05 ENCOUNTER — Institutional Professional Consult (permissible substitution): Payer: Medicare Other | Admitting: Internal Medicine

## 2013-06-12 ENCOUNTER — Ambulatory Visit (INDEPENDENT_AMBULATORY_CARE_PROVIDER_SITE_OTHER): Payer: Medicare Other | Admitting: Internal Medicine

## 2013-06-14 ENCOUNTER — Ambulatory Visit: Payer: Medicare Other | Admitting: Family Medicine

## 2013-07-10 ENCOUNTER — Telehealth: Payer: Self-pay | Admitting: Internal Medicine

## 2013-07-10 NOTE — Telephone Encounter (Signed)
New problem   Pt saw Dr Graciela Husbands in 2010 and is now in afib and want to know if she can come back to see Dr Graciela Husbands because it has been over 3 years. She stated Dr Graciela Husbands knows her and she want nurse to ask him if he would see her, because she know he will want to see her even though he isn't seeing new pt, which she feels like she isn't a new pt. Please call pt and let her know what Dr Odessa Fleming say.

## 2013-07-12 ENCOUNTER — Ambulatory Visit: Payer: Medicare Other | Admitting: Family Medicine

## 2013-07-13 ENCOUNTER — Telehealth: Payer: Self-pay | Admitting: Family Medicine

## 2013-07-13 ENCOUNTER — Ambulatory Visit: Payer: Medicare Other | Admitting: Orthopedic Surgery

## 2013-07-13 NOTE — Telephone Encounter (Signed)
Message from Dr. Diamantina Providence office

## 2013-07-17 ENCOUNTER — Telehealth: Payer: Self-pay | Admitting: Family Medicine

## 2013-07-17 DIAGNOSIS — E785 Hyperlipidemia, unspecified: Secondary | ICD-10-CM

## 2013-07-17 DIAGNOSIS — E039 Hypothyroidism, unspecified: Secondary | ICD-10-CM

## 2013-07-17 DIAGNOSIS — E8881 Metabolic syndrome: Secondary | ICD-10-CM

## 2013-07-17 NOTE — Telephone Encounter (Signed)
None in system, What do you want to order?

## 2013-07-17 NOTE — Telephone Encounter (Signed)
pls order fasting lipid, cmp and EGFR, hBA1C anmd TSH

## 2013-07-18 NOTE — Addendum Note (Signed)
Addended by: Abner Greenspan on: 07/18/2013 08:20 AM   Modules accepted: Orders

## 2013-07-18 NOTE — Telephone Encounter (Signed)
Lab order sent in

## 2013-07-19 ENCOUNTER — Ambulatory Visit: Payer: Medicare Other | Admitting: Family Medicine

## 2013-07-19 ENCOUNTER — Telehealth: Payer: Self-pay | Admitting: Family Medicine

## 2013-07-19 NOTE — Telephone Encounter (Signed)
This has already been sent over  

## 2013-07-24 LAB — COMPLETE METABOLIC PANEL WITH GFR
Alkaline Phosphatase: 64 U/L (ref 39–117)
GFR, Est Non African American: 59 mL/min — ABNORMAL LOW
Glucose, Bld: 101 mg/dL — ABNORMAL HIGH (ref 70–99)
Sodium: 140 mEq/L (ref 135–145)
Total Bilirubin: 0.4 mg/dL (ref 0.3–1.2)
Total Protein: 6.8 g/dL (ref 6.0–8.3)

## 2013-07-24 LAB — LIPID PANEL
Cholesterol: 203 mg/dL — ABNORMAL HIGH (ref 0–200)
LDL Cholesterol: 104 mg/dL — ABNORMAL HIGH (ref 0–99)
Triglycerides: 250 mg/dL — ABNORMAL HIGH (ref <150)

## 2013-07-24 LAB — HEMOGLOBIN A1C: Hgb A1c MFr Bld: 5.8 % — ABNORMAL HIGH (ref <5.7)

## 2013-07-24 LAB — TSH: TSH: 2.428 u[IU]/mL (ref 0.350–4.500)

## 2013-07-25 ENCOUNTER — Encounter: Payer: Self-pay | Admitting: Family Medicine

## 2013-07-25 ENCOUNTER — Ambulatory Visit (INDEPENDENT_AMBULATORY_CARE_PROVIDER_SITE_OTHER): Payer: Medicare Other | Admitting: Family Medicine

## 2013-07-25 VITALS — BP 118/82 | HR 100 | Resp 18 | Ht 64.5 in | Wt 242.0 lb

## 2013-07-25 DIAGNOSIS — R519 Headache, unspecified: Secondary | ICD-10-CM

## 2013-07-25 DIAGNOSIS — E785 Hyperlipidemia, unspecified: Secondary | ICD-10-CM

## 2013-07-25 DIAGNOSIS — F411 Generalized anxiety disorder: Secondary | ICD-10-CM

## 2013-07-25 DIAGNOSIS — R490 Dysphonia: Secondary | ICD-10-CM

## 2013-07-25 DIAGNOSIS — N83209 Unspecified ovarian cyst, unspecified side: Secondary | ICD-10-CM

## 2013-07-25 DIAGNOSIS — E669 Obesity, unspecified: Secondary | ICD-10-CM

## 2013-07-25 DIAGNOSIS — R5383 Other fatigue: Secondary | ICD-10-CM

## 2013-07-25 DIAGNOSIS — E039 Hypothyroidism, unspecified: Secondary | ICD-10-CM

## 2013-07-25 DIAGNOSIS — K219 Gastro-esophageal reflux disease without esophagitis: Secondary | ICD-10-CM

## 2013-07-25 DIAGNOSIS — R5381 Other malaise: Secondary | ICD-10-CM

## 2013-07-25 DIAGNOSIS — I499 Cardiac arrhythmia, unspecified: Secondary | ICD-10-CM

## 2013-07-25 DIAGNOSIS — R51 Headache: Secondary | ICD-10-CM

## 2013-07-25 DIAGNOSIS — R000 Tachycardia, unspecified: Secondary | ICD-10-CM

## 2013-07-25 DIAGNOSIS — D509 Iron deficiency anemia, unspecified: Secondary | ICD-10-CM

## 2013-07-25 HISTORY — DX: Cardiac arrhythmia, unspecified: I49.9

## 2013-07-25 NOTE — Progress Notes (Signed)
  Subjective:    Patient ID: [name], female    DOB: 04-Jan-1961, 52 y.o.   MRN: [id number]  HPI The PT is here for follow up and re-evaluation of chronic medical conditions, medication management and review of any available recent lab and radiology data.  Preventive health is updated, specifically  Cancer screening and Immunization.   Has upcoming surgery for flap over fistula next month at [facility] , 3rd or end. C/O irregular heart rate and fatigue in the past 2 month, wants card eval unable to get an appointment, reports chest and jaw pain with the fast heart rate, has had light headedness and sweat with the symptoms  Has BM through anus since 2012 but fistula closure an ongoing problem No urinary symptoms Has had gyne eval, will use premarin cream sparingly Experiences back pain radiating to both legs , disturbs her sleep  2 month h/o cough, sore throat and fatigue, denies fever, chills, nasal drainage or sputum.Has had ENT eval for he chronic c/o sore throat in the past      Review of Systems See HPI Denies recent fever or chills. Denies sinus pressure, nasal congestion, ear pain or sore throat. Denies chest congestion, productive cough or wheezing.  .   Denies dysuria, frequency, hesitancy or incontinence. Increased back pain which may be related to ongoing weight gain Denies headaches, seizures, numbness, or tingling. Denies depression,uncontrolled  anxiety or insomnia.         Objective:   Physical Exam Patient alert and oriented and in no cardiopulmonary distress.  HEENT: No facial asymmetry, EOMI, no sinus tenderness,  oropharynx pink and moist.  Neck supple no adenopathy.  Chest: Clear to auscultation bilaterally.  CVS: S1, S2 no murmurs, no S3.Heart rate regular on exam  ABD: Soft non tender. Bowel sounds normal.  Ext: No edema  MS: Adequate though reduced  ROM spine,adequate in  shoulders, hips and knees.  Skin: intestinal fistula, otherwise  normal  Psych: Good eye contact, normal affect. Memory intact mildly anxious not depressed appearing.  CNS: CN 2-12 intact, power,  normal throughout.        Assessment & Plan:

## 2013-07-25 NOTE — Patient Instructions (Addendum)
F/u in 4 month, call if you need me before  No changes in medication  Iron and ferritin will be added to the labs you have and a message with the result released will be sent  No new medication   We will attempt to get an appt with cardiology for you before august 10  Cut back on fried and fatty food pls  All the best with upcoming surgery  Fasting lipid, cmp and EGFr, CBc and tsh in 4 month  Vit D level was normal last September

## 2013-07-27 ENCOUNTER — Telehealth: Payer: Self-pay | Admitting: Family Medicine

## 2013-07-27 NOTE — Telephone Encounter (Signed)
Off the patient and patient stated she was rude and ugly and if she can get abn appointment in [place] she would take it

## 2013-07-28 ENCOUNTER — Telehealth: Payer: Self-pay | Admitting: Family Medicine

## 2013-07-28 NOTE — Telephone Encounter (Signed)
Would take care of this for her and that she would not need an appointment with the heart Dr. Before the surgery

## 2013-07-28 NOTE — Telephone Encounter (Signed)
Thanks for all that you do to try to take care of patient's needs. We will leave the family to locate their cardiologist

## 2013-07-28 NOTE — Telephone Encounter (Signed)
Spoke with patient and Melissa from [facility] at Dr. Stevie Kern office had called her and then another lady called her to set up the appointment for 8.11.2014 and that's when the patient is having surgery and the lady was ugly and rude to patient and the patient wants to know if maybe she can get an appointment in [place] by 8.11.2014 at the cardiology

## 2013-07-28 NOTE — Telephone Encounter (Signed)
pls see if she can get appt with [facility] in [place] or [place], thanks

## 2013-07-30 NOTE — Assessment & Plan Note (Signed)
Unchanged, chronic complaint, oropharynx normal. Has had recent ENT eval Salt water gargles and voice rest

## 2013-07-30 NOTE — Assessment & Plan Note (Signed)
Recent gyne exam since last visit, now using topical estrogen for post menopausal vaginal dryness

## 2013-07-30 NOTE — Assessment & Plan Note (Signed)
C/o receent increase in palpitations with fatigue, cardiology evaluation asap, espescialy in light of upcoming surgery

## 2013-07-30 NOTE — Assessment & Plan Note (Signed)
Worsened, likely due to significant weight gain and inability to commit to regular physical activity

## 2013-07-30 NOTE — Assessment & Plan Note (Signed)
Uncontrolled, needs to reduce fried and fatty food intake, no med change

## 2013-07-30 NOTE — Assessment & Plan Note (Signed)
Controlled, no change in medication  

## 2013-07-30 NOTE — Assessment & Plan Note (Signed)
Increased due to upcoming surgery and increased family stress, no med change

## 2013-07-30 NOTE — Assessment & Plan Note (Signed)
Deteriorated, continues to gain a significant amount of weight with increasing morbidity

## 2013-08-10 ENCOUNTER — Ambulatory Visit: Payer: Medicare Other | Admitting: Cardiovascular Disease

## 2013-08-31 DIAGNOSIS — F4322 Adjustment disorder with anxiety: Secondary | ICD-10-CM | POA: Insufficient documentation

## 2013-09-27 ENCOUNTER — Other Ambulatory Visit: Payer: Self-pay

## 2013-09-27 ENCOUNTER — Other Ambulatory Visit: Payer: Self-pay | Admitting: Family Medicine

## 2013-09-27 ENCOUNTER — Telehealth: Payer: Self-pay

## 2013-09-27 MED ORDER — ALPRAZOLAM 1 MG PO TABS: ORAL_TABLET | ORAL | Status: DC

## 2013-09-27 MED ORDER — LIDOCAINE 5 % EX OINT: TOPICAL_OINTMENT | CUTANEOUS | Status: AC | PRN

## 2013-09-27 NOTE — Telephone Encounter (Signed)
pls call [facility] and find out what they have likeltopical idocaine for use in anal area for numbing

## 2013-09-27 NOTE — Telephone Encounter (Signed)
Patient aware.

## 2013-09-27 NOTE — Telephone Encounter (Signed)
Thanks , pls lt her know the ointment is sent in, I am thankful she is back home and hope that she continues to recover well

## 2013-09-27 NOTE — Telephone Encounter (Signed)
They said they have lidocaine 5% ointment that can be used in the anal area.

## 2013-09-27 NOTE — Telephone Encounter (Signed)
Patient just out of hospital and bowels are starting to move again and her rectum is terribly sore. Has tried preparation H suppositories but wants to know if you can call her in some "numbing" suppositories to numb her rectum because she is in a lot of pain with the BM's. Please advise

## 2013-10-11 ENCOUNTER — Telehealth: Payer: Self-pay | Admitting: Family Medicine

## 2013-10-12 NOTE — Telephone Encounter (Signed)
Ms Zollie Beckers, [place] Mom called back at 02:30 am this morning, stating that the [facility] appt was for hesband, not Tiffany, tha Elmarie Shiley was having palpitations, and difficulty breathing, that she was sick and needed help. I advised her to take Tiffany to the Ed as earlier planned, she stated she would wait  Until the morning. I called and spoke directly with the ED diooc about the case (Dr Lynelle Doctor) and also spoke with Clifton Custard as promised at 08 10 , making her aware that the Ed at [facility] was again expecting Tiffany based on tele calls made about her health

## 2013-10-25 ENCOUNTER — Telehealth: Payer: Self-pay

## 2013-10-25 ENCOUNTER — Ambulatory Visit (INDEPENDENT_AMBULATORY_CARE_PROVIDER_SITE_OTHER): Payer: Medicare Other

## 2013-10-25 VITALS — BP 118/80 | Wt 225.0 lb

## 2013-10-25 DIAGNOSIS — Z23 Encounter for immunization: Secondary | ICD-10-CM

## 2013-10-25 DIAGNOSIS — M5431 Sciatica, right side: Secondary | ICD-10-CM

## 2013-10-25 DIAGNOSIS — M5430 Sciatica, unspecified side: Secondary | ICD-10-CM

## 2013-10-25 MED ORDER — METHYLPREDNISOLONE ACETATE 80 MG/ML IJ SUSP
80.0000 mg | Freq: Once | INTRAMUSCULAR | Status: AC
  Administered 2013-10-25: 80 mg via INTRAMUSCULAR

## 2013-10-25 MED ORDER — PREDNISONE 5 MG PO TABS: 5.0000 mg | ORAL_TABLET | Freq: Two times a day (BID) | ORAL | Status: DC

## 2013-10-25 MED ORDER — KETOROLAC TROMETHAMINE 60 MG/2ML IM SOLN
60.0000 mg | Freq: Once | INTRAMUSCULAR | Status: AC
  Administered 2013-10-25: 60 mg via INTRAMUSCULAR

## 2013-10-25 NOTE — Telephone Encounter (Signed)
Ok for toradol 60mg  Im and depo medrol 80mg  Im, pls document number value of pain, which side radiaiting to  And duration of symptoms. Prednisone for 5 days 5mg  one twice daily should be offered as well, let me know if she wants tis also and send in pls

## 2013-10-25 NOTE — Telephone Encounter (Signed)
Noted  

## 2013-10-25 NOTE — Progress Notes (Signed)
  Subjective:    Patient ID: [name], female    DOB: [birth year], 52 y.o.   MRN: [id number]  HPI    Review of Systems     Objective:   Physical Exam        Assessment & Plan:

## 2013-10-25 NOTE — Progress Notes (Signed)
Patient received toradol 60 and depo medrol 80mg  per Dr Lodema Hong with no complications

## 2013-11-30 ENCOUNTER — Ambulatory Visit: Payer: Medicare Other | Admitting: Family Medicine

## 2013-12-05 ENCOUNTER — Other Ambulatory Visit: Payer: Self-pay | Admitting: Family Medicine

## 2013-12-05 ENCOUNTER — Other Ambulatory Visit: Payer: Self-pay

## 2013-12-05 ENCOUNTER — Telehealth: Payer: Self-pay

## 2013-12-05 LAB — COMPLETE METABOLIC PANEL WITH GFR
ALT: 19 U/L (ref 0–35)
AST: 18 U/L (ref 0–37)
Albumin: 4.5 g/dL (ref 3.5–5.2)
BUN: 21 mg/dL (ref 6–23)
CO2: 24 mEq/L (ref 19–32)
Calcium: 9.8 mg/dL (ref 8.4–10.5)
Chloride: 104 mEq/L (ref 96–112)
Creat: 1.01 mg/dL (ref 0.50–1.10)
GFR, Est African American: 74 mL/min
Potassium: 4.3 mEq/L (ref 3.5–5.3)

## 2013-12-05 LAB — LIPID PANEL
LDL Cholesterol: 120 mg/dL — ABNORMAL HIGH (ref 0–99)
Triglycerides: 178 mg/dL — ABNORMAL HIGH (ref <150)
VLDL: 36 mg/dL (ref 0–40)

## 2013-12-05 LAB — CBC
HCT: 38.4 % (ref 36.0–46.0)
Hemoglobin: 12.9 g/dL (ref 12.0–15.0)
MCHC: 33.6 g/dL (ref 30.0–36.0)
RBC: 4.82 MIL/uL (ref 3.87–5.11)

## 2013-12-05 LAB — TSH: TSH: 3.455 u[IU]/mL (ref 0.350–4.500)

## 2013-12-05 MED ORDER — ESOMEPRAZOLE MAGNESIUM 40 MG PO CPDR: 40.0000 mg | DELAYED_RELEASE_CAPSULE | Freq: Two times a day (BID) | ORAL | Status: DC

## 2013-12-05 NOTE — Telephone Encounter (Signed)
Med sent.

## 2013-12-05 NOTE — Telephone Encounter (Signed)
Patient asking for a refill of her nexium 40mg  bid. Not on medlist but states she has been getting this from her mail order. No longer on omeprazole. Ok to send in a refill for 90 days?

## 2013-12-05 NOTE — Telephone Encounter (Signed)
Yes pls do and let her know

## 2013-12-06 LAB — HEMOGLOBIN A1C: Hgb A1c MFr Bld: 6 % — ABNORMAL HIGH (ref <5.7)

## 2013-12-15 ENCOUNTER — Telehealth: Payer: Self-pay | Admitting: Family Medicine

## 2013-12-15 ENCOUNTER — Other Ambulatory Visit: Payer: Self-pay | Admitting: Family Medicine

## 2013-12-15 DIAGNOSIS — C50012 Malignant neoplasm of nipple and areola, left female breast: Secondary | ICD-10-CM

## 2013-12-15 NOTE — Telephone Encounter (Signed)
Referral entered , due jan 8 or after please

## 2013-12-18 ENCOUNTER — Telehealth: Payer: Self-pay

## 2013-12-24 NOTE — Telephone Encounter (Signed)
Pls send for recent note /documentstin from Dr Danielle Dess. She has appt 01/05 and will address at that visit if unable to obtain info from Dr elsner, pls explain to pt

## 2013-12-26 NOTE — Telephone Encounter (Signed)
Called patient and left message for them to return call at the office   

## 2013-12-26 NOTE — Telephone Encounter (Signed)
Patient aware it will be addressed at 1/5 visit and she is ok with that. There are no notes from Dr Danielle Dess because she has not seen him yet and they told her to call here for MRI and a referral back to their office because she was considered a new patient again

## 2014-01-01 ENCOUNTER — Ambulatory Visit: Payer: Medicare Other | Admitting: Family Medicine

## 2014-01-18 ENCOUNTER — Ambulatory Visit: Payer: Medicare Other | Admitting: Family Medicine

## 2014-01-23 ENCOUNTER — Other Ambulatory Visit: Payer: Self-pay | Admitting: Family Medicine

## 2014-01-29 ENCOUNTER — Ambulatory Visit: Payer: Medicare Other | Admitting: Family Medicine

## 2014-02-04 ENCOUNTER — Emergency Department (HOSPITAL_COMMUNITY)
Admission: EM | Admit: 2014-02-04 | Discharge: 2014-02-04 | Disposition: A | Discharge status: Home / Self Care | Payer: Medicare Other | Attending: Emergency Medicine | Admitting: Emergency Medicine

## 2014-02-04 ENCOUNTER — Encounter (HOSPITAL_COMMUNITY): Payer: Self-pay | Admitting: Emergency Medicine

## 2014-02-04 ENCOUNTER — Emergency Department (HOSPITAL_COMMUNITY): Payer: Medicare Other

## 2014-02-04 DIAGNOSIS — Z79899 Other long term (current) drug therapy: Secondary | ICD-10-CM | POA: Insufficient documentation

## 2014-02-04 DIAGNOSIS — R1084 Generalized abdominal pain: Secondary | ICD-10-CM | POA: Insufficient documentation

## 2014-02-04 DIAGNOSIS — F329 Major depressive disorder, single episode, unspecified: Secondary | ICD-10-CM | POA: Insufficient documentation

## 2014-02-04 DIAGNOSIS — Z862 Personal history of diseases of the blood and blood-forming organs and certain disorders involving the immune mechanism: Secondary | ICD-10-CM | POA: Diagnosis not present

## 2014-02-04 DIAGNOSIS — F3289 Other specified depressive episodes: Secondary | ICD-10-CM | POA: Diagnosis not present

## 2014-02-04 DIAGNOSIS — I498 Other specified cardiac arrhythmias: Secondary | ICD-10-CM | POA: Insufficient documentation

## 2014-02-04 DIAGNOSIS — Z8673 Personal history of transient ischemic attack (TIA), and cerebral infarction without residual deficits: Secondary | ICD-10-CM | POA: Insufficient documentation

## 2014-02-04 DIAGNOSIS — E079 Disorder of thyroid, unspecified: Secondary | ICD-10-CM | POA: Diagnosis not present

## 2014-02-04 DIAGNOSIS — Z86718 Personal history of other venous thrombosis and embolism: Secondary | ICD-10-CM | POA: Insufficient documentation

## 2014-02-04 DIAGNOSIS — R109 Unspecified abdominal pain: Secondary | ICD-10-CM | POA: Diagnosis present

## 2014-02-04 DIAGNOSIS — IMO0001 Reserved for inherently not codable concepts without codable children: Secondary | ICD-10-CM | POA: Diagnosis not present

## 2014-02-04 DIAGNOSIS — G43909 Migraine, unspecified, not intractable, without status migrainosus: Secondary | ICD-10-CM | POA: Insufficient documentation

## 2014-02-04 DIAGNOSIS — Z86711 Personal history of pulmonary embolism: Secondary | ICD-10-CM | POA: Insufficient documentation

## 2014-02-04 DIAGNOSIS — K632 Fistula of intestine: Secondary | ICD-10-CM | POA: Diagnosis not present

## 2014-02-04 DIAGNOSIS — E7800 Pure hypercholesterolemia, unspecified: Secondary | ICD-10-CM | POA: Insufficient documentation

## 2014-02-04 DIAGNOSIS — Z853 Personal history of malignant neoplasm of breast: Secondary | ICD-10-CM | POA: Diagnosis not present

## 2014-02-04 DIAGNOSIS — F411 Generalized anxiety disorder: Secondary | ICD-10-CM | POA: Diagnosis not present

## 2014-02-04 DIAGNOSIS — Z9889 Other specified postprocedural states: Secondary | ICD-10-CM | POA: Insufficient documentation

## 2014-02-04 DIAGNOSIS — K219 Gastro-esophageal reflux disease without esophagitis: Secondary | ICD-10-CM | POA: Insufficient documentation

## 2014-02-04 DIAGNOSIS — Z880 Allergy status to penicillin: Secondary | ICD-10-CM | POA: Insufficient documentation

## 2014-02-04 DIAGNOSIS — Z8619 Personal history of other infectious and parasitic diseases: Secondary | ICD-10-CM | POA: Diagnosis not present

## 2014-02-04 DIAGNOSIS — Z8701 Personal history of pneumonia (recurrent): Secondary | ICD-10-CM | POA: Insufficient documentation

## 2014-02-04 DIAGNOSIS — R34 Anuria and oliguria: Secondary | ICD-10-CM | POA: Diagnosis not present

## 2014-02-04 DIAGNOSIS — Z8742 Personal history of other diseases of the female genital tract: Secondary | ICD-10-CM | POA: Insufficient documentation

## 2014-02-04 LAB — CBC WITH DIFFERENTIAL/PLATELET
Basophils Absolute: 0 10*3/uL (ref 0.0–0.1)
Basophils Relative: 0 % (ref 0–1)
Eosinophils Absolute: 0.1 10*3/uL (ref 0.0–0.7)
Eosinophils Relative: 1 % (ref 0–5)
HCT: 40.3 % (ref 36.0–46.0)
Hemoglobin: 13.1 g/dL (ref 12.0–15.0)
Lymphocytes Relative: 20 % (ref 12–46)
Lymphs Abs: 1.9 10*3/uL (ref 0.7–4.0)
MCH: 26.7 pg (ref 26.0–34.0)
MCHC: 32.5 g/dL (ref 30.0–36.0)
MCV: 82.2 fL (ref 78.0–100.0)
Monocytes Absolute: 0.5 10*3/uL (ref 0.1–1.0)
Monocytes Relative: 6 % (ref 3–12)
Neutro Abs: 7.1 10*3/uL (ref 1.7–7.7)
Neutrophils Relative %: 74 % (ref 43–77)
Platelets: 285 10*3/uL (ref 150–400)
RBC: 4.9 MIL/uL (ref 3.87–5.11)
RDW: 15.4 % (ref 11.5–15.5)
WBC: 9.6 10*3/uL (ref 4.0–10.5)

## 2014-02-04 LAB — COMPREHENSIVE METABOLIC PANEL
ALT: 21 U/L (ref 0–35)
AST: 21 U/L (ref 0–37)
Albumin: 4.1 g/dL (ref 3.5–5.2)
Alkaline Phosphatase: 68 U/L (ref 39–117)
BUN: 32 mg/dL — ABNORMAL HIGH (ref 6–23)
CO2: 24 mEq/L (ref 19–32)
Calcium: 9.6 mg/dL (ref 8.4–10.5)
Chloride: 101 mEq/L (ref 96–112)
Creatinine, Ser: 1.18 mg/dL — ABNORMAL HIGH (ref 0.50–1.10)
GFR calc Af Amer: 60 mL/min — ABNORMAL LOW (ref >90)
GFR calc non Af Amer: 52 mL/min — ABNORMAL LOW (ref >90)
Glucose, Bld: 115 mg/dL — ABNORMAL HIGH (ref 70–99)
Potassium: 4.5 mEq/L (ref 3.7–5.3)
Sodium: 139 mEq/L (ref 137–147)
Total Bilirubin: 0.3 mg/dL (ref 0.3–1.2)
Total Protein: 7.7 g/dL (ref 6.0–8.3)

## 2014-02-04 LAB — URINALYSIS, ROUTINE W REFLEX MICROSCOPIC
Bilirubin Urine: NEGATIVE
Glucose, UA: NEGATIVE mg/dL
Hgb urine dipstick: NEGATIVE
Ketones, ur: NEGATIVE mg/dL
Leukocytes, UA: NEGATIVE
Nitrite: NEGATIVE
Protein, ur: NEGATIVE mg/dL
Specific Gravity, Urine: 1.025 (ref 1.005–1.030)
Urobilinogen, UA: 0.2 mg/dL (ref 0.0–1.0)
pH: 6 (ref 5.0–8.0)

## 2014-02-04 LAB — LIPASE, BLOOD: Lipase: 40 U/L (ref 11–59)

## 2014-02-04 MED ORDER — IOHEXOL 300 MG/ML  SOLN
50.0000 mL | Freq: Once | INTRAMUSCULAR | Status: AC | PRN
  Administered 2014-02-04: 50 mL via ORAL

## 2014-02-04 MED ORDER — OXYCODONE-ACETAMINOPHEN 5-325 MG PO TABS: 1.0000 | ORAL_TABLET | ORAL | Status: DC | PRN

## 2014-02-04 MED ORDER — IOHEXOL 300 MG/ML  SOLN
100.0000 mL | Freq: Once | INTRAMUSCULAR | Status: AC | PRN
  Administered 2014-02-04: 100 mL via INTRAVENOUS

## 2014-02-04 MED ORDER — HYDROMORPHONE HCL PF 1 MG/ML IJ SOLN
1.0000 mg | Freq: Once | INTRAMUSCULAR | Status: AC
  Administered 2014-02-04: 1 mg via INTRAVENOUS
  Filled 2014-02-04: qty 1

## 2014-02-04 MED ORDER — PROMETHAZINE HCL 25 MG/ML IJ SOLN
25.0000 mg | Freq: Once | INTRAMUSCULAR | Status: AC
  Administered 2014-02-04: 25 mg via INTRAVENOUS
  Filled 2014-02-04: qty 1

## 2014-02-04 MED ORDER — SODIUM CHLORIDE 0.9 % IV BOLUS (SEPSIS)
1000.0000 mL | Freq: Once | INTRAVENOUS | Status: AC
  Administered 2014-02-04: 1000 mL via INTRAVENOUS

## 2014-02-04 MED ORDER — IOHEXOL 300 MG/ML  SOLN: 50.0000 mL | Freq: Once | INTRAMUSCULAR | Status: DC | PRN

## 2014-02-04 NOTE — ED Provider Notes (Signed)
CSN: [id number]     Arrival date & time 02/04/14  1737 History   First MD Initiated Contact with Patient 02/04/14 1743     Chief Complaint  Patient presents with  . Abdominal Pain   (Consider location/radiation/quality/duration/timing/severity/associated sxs/prior Treatment) HPI  53 year old female with abdominal pain, nausea and decreased output from her enterocutaneous fistula. Very complicated past surgical history with multiple prior surgeries. CT scan having diffuse abdominal pain, slightly worse on the left side. Comes in waves. Very intense. Associated with nausea, but no vomiting. She reports decreased output from her enterocutaneous fistula. She normally passes stool multiple times per day from her rectum, but has only done so once earlier this morning. No fever or chills. No urinary complaints. No sick contacts. She tried taking some Senokot w/o relief.    Past Medical History  Diagnosis Date  . SVT (supraventricular tachycardia)   . Hypercholesterolemia   . Bilateral ovarian cysts   . Perforation bowel   . PE (pulmonary embolism)   . Thyroid disease   . GERD (gastroesophageal reflux disease)   . Sepsis(995.91)   . E coli infection   . Anxiety   . Pneumonia   . Depression   . Carpal tunnel syndrome   . DVT (deep venous thrombosis)   . Anemia   . Irregular heartbeat   . Stroke   . DVT (deep venous thrombosis)   . Blood transfusion without reported diagnosis   . Clotting disorder   . Fibromyalgia   . Hx of migraines   . Interstitial cystitis   . Cancer     left breast   . Acute renal failure     renal faliure with surgery    Past Surgical History  Procedure Laterality Date  . Heart ablation    . Vena cava filter placement    . Abdominal hysterectomy    . Hernia repair    . Bowel resection    . Carpal tunnel release    . Bowel fistula    . Breast enhancement surgery  1983  . Removal of breast implants  1993  . Cesarean section  1986  . Colon surgery    .  Cosmetic surgery     Family History  Problem Relation Age of Onset  . Diabetes Mother   . Heart disease Mother   . Hypertension Mother   . Heart disease Father   . Hyperlipidemia Father   . Hypertension Father   . Cancer Maternal Aunt     colon  . Cancer Maternal Uncle     mets  . Cancer Maternal Grandfather     bone/mets  . Cancer Cousin 19    ovarian  . Cancer Maternal Uncle     prostate   History  Substance Use Topics  . Smoking status: Never Smoker   . Smokeless tobacco: Never Used  . Alcohol Use: No     Comment: rare   OB History   Grav Para Term Preterm Abortions TAB SAB Ect Mult Living                 Review of Systems  Allergies  Bisacodyl; Clarithromycin; Clarithromycin; Clindamycin hcl; Codeine; Keflex; Nitrofurantoin; Penicillins; Scopolamine hbr; Sulfonamide derivatives; and Tape  Home Medications   Current Outpatient Rx  Name  Route  Sig  Dispense  Refill  . ALPRAZolam (XANAX) 1 MG tablet   Oral   Take 1 mg by mouth 4 (four) times daily.         Marland Kitchen  butalbital-acetaminophen-caffeine (FIORICET, ESGIC) 50-325-40 MG per tablet   Oral   Take 1 tablet by mouth 2 (two) times daily as needed. For migraines Please deliver   14 tablet   1   . cyclobenzaprine (FLEXERIL) 10 MG tablet   Oral   Take 10 mg by mouth 3 (three) times daily as needed for muscle spasms.         Marland Kitchen esomeprazole (NEXIUM) 40 MG capsule   Oral   Take 1 capsule (40 mg total) by mouth 2 (two) times daily before a meal.   180 capsule   1   . fenofibrate (TRICOR) 145 MG tablet   Oral   Take 1 tablet (145 mg total) by mouth daily.   90 tablet   1   . levothyroxine (SYNTHROID, LEVOTHROID) 125 MCG tablet   Oral   Take 125 mcg by mouth daily before breakfast.         . polyethylene glycol (MIRALAX / GLYCOLAX) packet      Take 17 g by mouth 2 (two) times daily. Mix in 4-8ounces of fluid prior to taking.         . pravastatin (PRAVACHOL) 80 MG tablet   Oral   Take 80 mg  by mouth at bedtime.         . senna-docusate (SENOKOT-S) 8.6-50 MG per tablet   Oral   Take 2 tablets by mouth 3 (three) times daily as needed. constipation         . verapamil (CALAN) 120 MG tablet   Oral   Take 120 mg by mouth daily.         . vitamin C (ASCORBIC ACID) 500 MG tablet   Oral   Take 500 mg by mouth daily.         Marland Kitchen oxyCODONE-acetaminophen (PERCOCET/ROXICET) 5-325 MG per tablet   Oral   Take 1-2 tablets by mouth every 4 (four) hours as needed for severe pain.   6 tablet   0    BP 101/43  Pulse 93  Temp(Src) 97.9 F (36.6 C) (Oral)  Resp 18  Ht 5' 4.5" (1.638 m)  Wt 235 lb (106.595 kg)  BMI 39.73 kg/m2  SpO2 92% Physical Exam  Nursing note and vitals reviewed. Constitutional: She appears well-developed and well-nourished. No distress.  HENT:  Head: Normocephalic and atraumatic.  Eyes: Conjunctivae are normal. Right eye exhibits no discharge. Left eye exhibits no discharge.  Neck: Neck supple.  Cardiovascular: Normal rate, regular rhythm and normal heart sounds.  Exam reveals no gallop and no friction rub.   No murmur heard. Pulmonary/Chest: Effort normal and breath sounds normal. No respiratory distress.  Abdominal: Soft. She exhibits no distension. There is no tenderness.  Evidence of multiple previous abdominal surgeries. Enterocutaneous cutaneous fistula near the midline with watery brown stool in bag. Diffuse abdominal tenderness, worse on the left side. No rebound or guarding.  Musculoskeletal: She exhibits no edema and no tenderness.  Neurological: She is alert.  Skin: Skin is warm and dry.  Psychiatric: She has a normal mood and affect. Her behavior is normal. Thought content normal.    ED Course  Procedures (including critical care time) Labs Review Labs Reviewed  COMPREHENSIVE METABOLIC PANEL - Abnormal; Notable for the following:    Glucose, Bld 115 (*)    BUN 32 (*)    Creatinine, Ser 1.18 (*)    GFR calc non Af Amer 52 (*)     GFR calc Af Amer 60 (*)  All other components within normal limits  URINALYSIS, ROUTINE W REFLEX MICROSCOPIC - Abnormal; Notable for the following:    APPearance CLOUDY (*)    All other components within normal limits  CBC WITH DIFFERENTIAL  LIPASE, BLOOD   Imaging Review Ct Abdomen Pelvis W Contrast  02/04/2014   CLINICAL DATA:  Abdomen pain, nausea, vomiting  EXAM: CT ABDOMEN AND PELVIS WITH CONTRAST  TECHNIQUE: Multidetector CT imaging of the abdomen and pelvis was performed using the standard protocol following bolus administration of intravenous contrast.  CONTRAST:  35mL OMNIPAQUE IOHEXOL 300 MG/ML SOLN, 17mL OMNIPAQUE IOHEXOL 300 MG/ML SOLN  COMPARISON:  March 31, 2013  FINDINGS: Patient is status post prior cholecystectomy. There is postsurgical intrahepatic biliary ductal dilatation unchanged. The spleen, pancreas, adrenal glands and kidneys are normal. There is no hydronephrosis bilaterally. IVC filter is unchanged. The aorta is normal. There is no abdominal lymphadenopathy. There is anterior midline abdominal wound not significantly changed. Patient is status post subtotal colectomy with small bowel anastomosis to the sigmoid colon unchanged. Fluid-filled bladder is normal. There is mild chronic scar of the anterior left lung base and posterior right lung base. Right breast implant is noted. No acute abnormalities identified within the visualized bones.  IMPRESSION: No acute abnormality identified in the abdomen and pelvis. Stable chronic changes.   Electronically Signed   By: Abelardo Diesel M.D.   On: 02/04/2014 21:02    EKG Interpretation   None       MDM   1. Abdominal pain    53 year old female with abdominal pain, nausea and decreased output from her enterocutaneous fistula. Workup has been pretty unremarkable. There is no evidence of bowel obstruction or other acute pathology. Symptoms improved significnatly. Mild elevation in BUN and creatinine, but not impressively so. She  was given IV fluids. Will be discharged with as needed pain medication. Return precautions were discussed. Outpatient followup with her surgeon otherwise.    Virgel Manifold, MD 02/04/14 2212

## 2014-02-04 NOTE — ED Notes (Signed)
Patient has had extensive abdominal surgery and is cramping today, states she is worried that her stomach is not emptying

## 2014-02-04 NOTE — Discharge Instructions (Signed)
Abdominal Pain, Adult °Many things can cause abdominal pain. Usually, abdominal pain is not caused by a disease and will improve without treatment. It can often be observed and treated at home. Your health care provider will do a physical exam and possibly order blood tests and X-rays to help determine the seriousness of your pain. However, in many cases, more time must pass before a clear cause of the pain can be found. Before that point, your health care provider may not know if you need more testing or further treatment. °HOME CARE INSTRUCTIONS  °Monitor your abdominal pain for any changes. The following actions may help to alleviate any discomfort you are experiencing: °· Only take over-the-counter or prescription medicines as directed by your health care provider. °· Do not take laxatives unless directed to do so by your health care provider. °· Try a clear liquid diet (broth, tea, or water) as directed by your health care provider. Slowly move to a bland diet as tolerated. °SEEK MEDICAL CARE IF: °· You have unexplained abdominal pain. °· You have abdominal pain associated with nausea or diarrhea. °· You have pain when you urinate or have a bowel movement. °· You experience abdominal pain that wakes you in the night. °· You have abdominal pain that is worsened or improved by eating food. °· You have abdominal pain that is worsened with eating fatty foods. °SEEK IMMEDIATE MEDICAL CARE IF:  °· Your pain does not go away within 2 hours. °· You have a fever. °· You keep throwing up (vomiting). °· Your pain is felt only in portions of the abdomen, such as the right side or the left lower portion of the abdomen. °· You pass bloody or black tarry stools. °MAKE SURE YOU: °· Understand these instructions.   °· Will watch your condition.   °· Will get help right away if you are not doing well or get worse.   °Document Released: 09/23/2005 Document Revised: 10/04/2013 Document Reviewed: 08/23/2013 °[employer]® Patient  Information ©2014 [employer]. ° °

## 2014-02-04 NOTE — ED Notes (Signed)
Pt c/o abd pain/cramping that started this am, has had numerous surgeries on her abd area, has wound care in place to abd area, has tried several things at home to have a bowel movement with no relief in symptoms, last bowel movement was this am but was black per pt,

## 2014-02-05 MED FILL — Oxycodone w/ Acetaminophen Tab 5-325 MG: ORAL | Qty: 6 | Status: AC

## 2014-02-06 ENCOUNTER — Ambulatory Visit: Payer: Medicare Other | Admitting: Family Medicine

## 2014-02-07 ENCOUNTER — Other Ambulatory Visit: Payer: Self-pay

## 2014-02-07 ENCOUNTER — Encounter: Payer: Self-pay | Admitting: Family Medicine

## 2014-02-07 ENCOUNTER — Ambulatory Visit (INDEPENDENT_AMBULATORY_CARE_PROVIDER_SITE_OTHER): Payer: Medicare Other | Admitting: Family Medicine

## 2014-02-07 VITALS — BP 112/80 | HR 100 | Resp 20 | Ht 64.5 in | Wt 240.0 lb

## 2014-02-07 DIAGNOSIS — R519 Headache, unspecified: Secondary | ICD-10-CM

## 2014-02-07 DIAGNOSIS — R7309 Other abnormal glucose: Secondary | ICD-10-CM

## 2014-02-07 DIAGNOSIS — R7302 Impaired glucose tolerance (oral): Secondary | ICD-10-CM

## 2014-02-07 DIAGNOSIS — E8881 Metabolic syndrome: Secondary | ICD-10-CM

## 2014-02-07 DIAGNOSIS — I499 Cardiac arrhythmia, unspecified: Secondary | ICD-10-CM

## 2014-02-07 DIAGNOSIS — M549 Dorsalgia, unspecified: Secondary | ICD-10-CM

## 2014-02-07 DIAGNOSIS — E785 Hyperlipidemia, unspecified: Secondary | ICD-10-CM

## 2014-02-07 DIAGNOSIS — M542 Cervicalgia: Secondary | ICD-10-CM

## 2014-02-07 DIAGNOSIS — K219 Gastro-esophageal reflux disease without esophagitis: Secondary | ICD-10-CM

## 2014-02-07 DIAGNOSIS — C50019 Malignant neoplasm of nipple and areola, unspecified female breast: Secondary | ICD-10-CM

## 2014-02-07 DIAGNOSIS — F411 Generalized anxiety disorder: Secondary | ICD-10-CM

## 2014-02-07 DIAGNOSIS — R51 Headache: Secondary | ICD-10-CM

## 2014-02-07 DIAGNOSIS — E039 Hypothyroidism, unspecified: Secondary | ICD-10-CM

## 2014-02-07 MED ORDER — METHYLPREDNISOLONE ACETATE 80 MG/ML IJ SUSP
80.0000 mg | Freq: Once | INTRAMUSCULAR | Status: AC
  Administered 2014-02-07: 80 mg via INTRAMUSCULAR

## 2014-02-07 MED ORDER — LEVOTHYROXINE SODIUM 125 MCG PO TABS: 125.0000 ug | ORAL_TABLET | Freq: Every day | ORAL | Status: DC

## 2014-02-07 MED ORDER — KETOROLAC TROMETHAMINE 60 MG/2ML IM SOLN
60.0000 mg | Freq: Once | INTRAMUSCULAR | Status: AC
  Administered 2014-02-07: 60 mg via INTRAMUSCULAR

## 2014-02-07 MED ORDER — PREDNISONE 5 MG PO KIT: PACK | ORAL | Status: DC

## 2014-02-07 MED ORDER — PRAVASTATIN SODIUM 80 MG PO TABS: 80.0000 mg | ORAL_TABLET | Freq: Every day | ORAL | Status: DC

## 2014-02-07 MED ORDER — FENOFIBRATE 145 MG PO TABS: 145.0000 mg | ORAL_TABLET | Freq: Every day | ORAL | Status: DC

## 2014-02-07 NOTE — Progress Notes (Signed)
Subjective:    Patient ID: [name], female    DOB: 16-Nov-1961, 53 y.o.   MRN: [id number]  HPI Pt at [facility] for 3 days from Dec 31 Jan 3, with acute Gastroenteritis C/o inc right upper and lower ext pain and tingling pains, burning, swelling  Of hand, wants injections in office today , imaging studies of entire spine , and referral for neurosurgical eval, specifically requests Dr Ellene Route , who has seen her in the past Needs to change from xanax due to coverage, will provide list of alternative medication. Has been under increased stress x 6 months, with her daughter's illness, which is finally appearing to improve per her report Needs colonoscopy, but this will be done at [facility] once her intestinal situation is stable,was in the ED 3 days  ago with acute abdominal pain Wants to re establish with local cardiology service, she has had  Ablation for tachycardia in the past, and needs to maintain cardiology follow up and evaluation, she alsoi has independent factors for CAD she has the metabolic syndrome, chronic pain syndrome limits her regular exercise and she continues to gain weight also  Review of Systems See HPI Denies recent fever or chills. Denies sinus pressure, nasal congestion, ear pain or sore throat. Denies chest congestion, productive cough or wheezing. Denies chest pains, c/o intermittent  palpitations denies  leg swelling, concerned about left arm swelling present for years Needs PPI for control of GERd symptoms,    Denies dysuria, frequency, hesitancy or incontinence.Does have h/o iC , but denies any urinary symptoms at this time Intermittent hestress triggered headaches, denies seizures, c/o extremity , numbness, and  tingling. Denies depression,does c/o  anxiety and  insomnia. Denies skin break down or rash.        Objective:   Physical Exam BP 112/80  Pulse 100  Resp 20  Ht 5' 4.5" (1.638 m)  Wt 240 lb 0.6 oz (108.881 kg)  BMI 40.58 kg/m2  SpO2 94% Patient  alert and oriented and in no cardiopulmonary distress.Pt in pain  HEENT: No facial asymmetry, EOMI, no sinus tenderness,  oropharynx pink and moist.  Neck decreased ROM due to pain no adenopathy.  Chest: Clear to auscultation bilaterally.  CVS: S1, S2 no murmurs, no S3.  ABD: Soft no localkized guarding or rebound Ext: No edema  MS: decreased ROM spine, adequate in  shoulders, hips and knees.  Skin: Intact, no ulcerations or rash noted.  Psych: Good eye contact, normal affect. Memory intact  anxious not  depressed appearing.  CNS: CN 2-12 intact, power, tone and sensation normal throughout.      Assessment & Plan:  Unspecified hypothyroidism Controlled, no change in medication   NECK PAIN, CHRONIC Uncontrolled and worse, will re image and refer to neurosurgeon of her choice once films are done, has established disease in her spine Toradol and depo medrol in office today followed by short course of oral prednisone  BACK PAIN, CHRONIC Increased and uncontrolled mid and low back pain, will, order MRI imaging of entire spine based on patients complaint, arhtritis and mild disc disease noted on film in 2011  HYPERLIPIDEMIA Uncontrolled Hyperlipidemia:Low fat diet discussed and encouraged.  Updated lab needed at/ before next visit.   ANXIETY, CHRONIC Worsened in the past approx 6 months due to ill health of her daughter along with her chronic medical issues, no change in dose of xanax  Morbid obesity Deteriorated. Patient re-educated about  the importance of commitment to a  minimum  of 150 minutes of exercise per week. The importance of healthy food choices with portion control discussed. Encouraged to start a food diary, count calories and to consider  joining a support group. Sample diet sheets offered. Goals set by the patient for the next several months.     Headache disorder Uses fioricet on as needed basis  GERD (gastroesophageal reflux disease) Takes  omeprazole on a regular basis for symptom control  Irregular heart rate Past h/o ablation wants to re establish with local cardiology service , will refer for evaluation and re establishment of care  Metabolic syndrome X The increased risk of cardiovascular disease associated with this diagnosis, and the need to consistently work on lifestyle to change this is discussed. Following  a  heart healthy diet ,commitment to 30 minutes of exercise at least 5 days per week, as well as control of blood sugar and cholesterol , and achieving a healthy weight are all the areas to be addressed .

## 2014-02-07 NOTE — Patient Instructions (Signed)
F/U in 3.5 months, call if you need me before  It is important that you exercise regularly at least 30 minutes 5 times a week. If you develop chest pain, have severe difficulty breathing, or feel very tired, stop exercising immediately and seek medical attention    A healthy diet is rich in fruit, vegetables and whole grains. Poultry fish, nuts and beans are a healthy choice for protein rather then red meat. A low sodium diet and drinking 64 ounces of water daily is generally recommended. Oils and sweet should be limited. Carbohydrates especially for those who are diabetic or overweight, should be limited to 45 to 60 gram per meal. It is important to eat on a regular schedule, at least 3 times daily. Snacks should be primarily fruits, vegetables or nuts.  Adequate rest, generally 6 to 8 hours per night is important for good health.Good sleep hygiene involves setting a regular bedtime, and turning off all sound and light in your sleep environment.Limiting caffeine intake will also help with the ability to rest well  Lab work needs to be done 3 to 5 days before your follow up visit please. Fasting lipid, cmop and EGFR , HBA1C, TSH  All medications need to be brought to every visit  You will be referred for MRI of your cervical, thoracic and lumbar spine  Please drop off the list of alternative medication for xanax as we discussed  Toradol and depomedrol in office today and prednisone 60m dose pack sent in  Vaccines are up to date

## 2014-02-14 ENCOUNTER — Telehealth: Payer: Self-pay | Admitting: Family Medicine

## 2014-02-14 ENCOUNTER — Other Ambulatory Visit (HOSPITAL_COMMUNITY): Payer: Medicare Other

## 2014-02-14 ENCOUNTER — Ambulatory Visit: Payer: Medicare Other

## 2014-02-15 ENCOUNTER — Ambulatory Visit (HOSPITAL_COMMUNITY): Admission: RE | Admit: 2014-02-15 | Payer: Medicare Other | Source: Ambulatory Visit

## 2014-02-16 ENCOUNTER — Ambulatory Visit (HOSPITAL_COMMUNITY): Payer: Medicare Other

## 2014-02-16 NOTE — Telephone Encounter (Signed)
No record of call found.

## 2014-02-18 ENCOUNTER — Encounter: Payer: Self-pay | Admitting: Family Medicine

## 2014-02-18 NOTE — Assessment & Plan Note (Signed)
Deteriorated. Patient re-educated about  the importance of commitment to a  minimum of 150 minutes of exercise per week. The importance of healthy food choices with portion control discussed. Encouraged to start a food diary, count calories and to consider  joining a support group. Sample diet sheets offered. Goals set by the patient for the next several months.    

## 2014-02-18 NOTE — Assessment & Plan Note (Signed)
Controlled, no change in medication  

## 2014-02-18 NOTE — Assessment & Plan Note (Signed)
Uses fioricet on as needed basis

## 2014-02-18 NOTE — Assessment & Plan Note (Signed)
Increased and uncontrolled mid and low back pain, will, order MRI imaging of entire spine based on patients complaint, arhtritis and mild disc disease noted on film in 2011

## 2014-02-18 NOTE — Assessment & Plan Note (Signed)
Uncontrolled and worse, will re image and refer to neurosurgeon of her choice once films are done, has established disease in her spine Toradol and depo medrol in office today followed by short course of oral prednisone

## 2014-02-18 NOTE — Assessment & Plan Note (Signed)
Uncontrolled. Hyperlipidemia:Low fat diet discussed and encouraged.  Updated lab needed at/ before next visit.  

## 2014-02-18 NOTE — Assessment & Plan Note (Signed)
The increased risk of cardiovascular disease associated with this diagnosis, and the need to consistently work on lifestyle to change this is discussed. Following  a  heart healthy diet ,commitment to 30 minutes of exercise at least 5 days per week, as well as control of blood sugar and cholesterol , and achieving a healthy weight are all the areas to be addressed .  

## 2014-02-18 NOTE — Assessment & Plan Note (Signed)
Past h/o ablation wants to re establish with local cardiology service , will refer for evaluation and re establishment of care

## 2014-02-18 NOTE — Assessment & Plan Note (Signed)
Takes omeprazole on a regular basis for symptom control

## 2014-02-18 NOTE — Assessment & Plan Note (Signed)
Worsened in the past approx 6 months due to ill health of her daughter along with her chronic medical issues, no change in dose of xanax

## 2014-02-23 ENCOUNTER — Ambulatory Visit: Payer: Medicare Other | Admitting: Cardiology

## 2014-02-23 ENCOUNTER — Telehealth: Payer: Self-pay | Admitting: Family Medicine

## 2014-02-23 DIAGNOSIS — N3000 Acute cystitis without hematuria: Secondary | ICD-10-CM

## 2014-02-23 MED ORDER — FLUCONAZOLE 150 MG PO TABS: ORAL_TABLET | ORAL | Status: DC

## 2014-02-23 MED ORDER — LEVOFLOXACIN 500 MG PO TABS: 500.0000 mg | ORAL_TABLET | Freq: Every day | ORAL | Status: DC

## 2014-02-23 NOTE — Telephone Encounter (Signed)
Called and spoke with patient and she has already spoken with MD

## 2014-02-23 NOTE — Telephone Encounter (Signed)
Order and specimen cup ready for collection

## 2014-02-23 NOTE — Telephone Encounter (Signed)
Urinary symptoms since 3 am, frequency and burning, no fever , chills, or flank pain I will send in levaquin x 5 days and fluconazole #2, she is aware  Pls order urine for c/s only and leave in a specimen bag with a labeled container, her husband will collect this morning , thanks

## 2014-02-23 NOTE — Addendum Note (Signed)
Addended by: Eual Fines on: 02/23/2014 09:45 AM   Modules accepted: Orders

## 2014-02-26 ENCOUNTER — Ambulatory Visit: Payer: Medicare Other

## 2014-02-27 ENCOUNTER — Ambulatory Visit (HOSPITAL_COMMUNITY): Payer: Medicare Other

## 2014-02-27 ENCOUNTER — Other Ambulatory Visit (HOSPITAL_COMMUNITY): Payer: Medicare Other

## 2014-02-27 ENCOUNTER — Ambulatory Visit (HOSPITAL_COMMUNITY): Admission: RE | Admit: 2014-02-27 | Payer: Medicare Other | Source: Ambulatory Visit

## 2014-02-28 ENCOUNTER — Other Ambulatory Visit (HOSPITAL_COMMUNITY): Payer: Medicare Other

## 2014-02-28 ENCOUNTER — Ambulatory Visit (HOSPITAL_COMMUNITY): Payer: Medicare Other

## 2014-03-06 ENCOUNTER — Encounter: Payer: Self-pay | Admitting: *Deleted

## 2014-03-13 ENCOUNTER — Ambulatory Visit (HOSPITAL_COMMUNITY): Payer: Medicare Other

## 2014-03-14 ENCOUNTER — Ambulatory Visit (HOSPITAL_COMMUNITY): Payer: Medicare Other

## 2014-04-17 NOTE — Telephone Encounter (Signed)
error 

## 2014-04-25 ENCOUNTER — Other Ambulatory Visit: Payer: Self-pay | Admitting: Family Medicine

## 2014-05-14 ENCOUNTER — Telehealth: Payer: Self-pay | Admitting: Family Medicine

## 2014-05-14 DIAGNOSIS — M549 Dorsalgia, unspecified: Secondary | ICD-10-CM

## 2014-05-14 DIAGNOSIS — M542 Cervicalgia: Secondary | ICD-10-CM

## 2014-05-14 NOTE — Telephone Encounter (Signed)
Reordered and Luann to schedule

## 2014-05-22 ENCOUNTER — Ambulatory Visit (HOSPITAL_COMMUNITY)
Admission: RE | Admit: 2014-05-22 | Discharge: 2014-05-22 | Disposition: A | Discharge status: Home / Self Care | Payer: Medicare Other | Source: Ambulatory Visit | Attending: Family Medicine | Admitting: Family Medicine

## 2014-05-22 ENCOUNTER — Encounter (HOSPITAL_COMMUNITY): Payer: Self-pay

## 2014-05-22 DIAGNOSIS — M542 Cervicalgia: Secondary | ICD-10-CM | POA: Diagnosis present

## 2014-05-22 DIAGNOSIS — M5124 Other intervertebral disc displacement, thoracic region: Secondary | ICD-10-CM | POA: Diagnosis not present

## 2014-05-22 DIAGNOSIS — Z853 Personal history of malignant neoplasm of breast: Secondary | ICD-10-CM | POA: Insufficient documentation

## 2014-05-22 DIAGNOSIS — M5137 Other intervertebral disc degeneration, lumbosacral region: Secondary | ICD-10-CM | POA: Insufficient documentation

## 2014-05-22 DIAGNOSIS — M549 Dorsalgia, unspecified: Secondary | ICD-10-CM

## 2014-05-22 DIAGNOSIS — M51379 Other intervertebral disc degeneration, lumbosacral region without mention of lumbar back pain or lower extremity pain: Secondary | ICD-10-CM | POA: Insufficient documentation

## 2014-05-22 DIAGNOSIS — M545 Low back pain, unspecified: Secondary | ICD-10-CM | POA: Insufficient documentation

## 2014-05-22 DIAGNOSIS — IMO0002 Reserved for concepts with insufficient information to code with codable children: Secondary | ICD-10-CM | POA: Insufficient documentation

## 2014-05-22 DIAGNOSIS — R209 Unspecified disturbances of skin sensation: Secondary | ICD-10-CM | POA: Diagnosis not present

## 2014-05-22 DIAGNOSIS — M546 Pain in thoracic spine: Secondary | ICD-10-CM | POA: Diagnosis not present

## 2014-05-23 ENCOUNTER — Other Ambulatory Visit: Payer: Self-pay

## 2014-05-23 DIAGNOSIS — M5090 Cervical disc disorder, unspecified, unspecified cervical region: Secondary | ICD-10-CM

## 2014-05-23 DIAGNOSIS — M4802 Spinal stenosis, cervical region: Secondary | ICD-10-CM

## 2014-06-05 ENCOUNTER — Telehealth: Payer: Self-pay | Admitting: Family Medicine

## 2014-06-05 NOTE — Telephone Encounter (Signed)
Patient is aware 

## 2014-06-12 ENCOUNTER — Other Ambulatory Visit: Payer: Self-pay

## 2014-06-12 DIAGNOSIS — E559 Vitamin D deficiency, unspecified: Secondary | ICD-10-CM

## 2014-06-12 DIAGNOSIS — D509 Iron deficiency anemia, unspecified: Secondary | ICD-10-CM

## 2014-06-13 LAB — COMPLETE METABOLIC PANEL WITH GFR
ALBUMIN: 4.4 g/dL (ref 3.5–5.2)
ALK PHOS: 65 U/L (ref 39–117)
ALT: 21 U/L (ref 0–35)
AST: 22 U/L (ref 0–37)
BILIRUBIN TOTAL: 0.4 mg/dL (ref 0.2–1.2)
BUN: 20 mg/dL (ref 6–23)
CO2: 24 mEq/L (ref 19–32)
Calcium: 9.7 mg/dL (ref 8.4–10.5)
Chloride: 104 mEq/L (ref 96–112)
Creat: 0.98 mg/dL (ref 0.50–1.10)
GFR, EST NON AFRICAN AMERICAN: 67 mL/min
GFR, Est African American: 77 mL/min
Glucose, Bld: 117 mg/dL — ABNORMAL HIGH (ref 70–99)
POTASSIUM: 4.5 meq/L (ref 3.5–5.3)
Sodium: 140 mEq/L (ref 135–145)
Total Protein: 7.2 g/dL (ref 6.0–8.3)

## 2014-06-13 LAB — HEMOGLOBIN A1C
HEMOGLOBIN A1C: 6.1 % — AB (ref <5.7)
MEAN PLASMA GLUCOSE: 128 mg/dL — AB (ref <117)

## 2014-06-13 LAB — VITAMIN D 25 HYDROXY (VIT D DEFICIENCY, FRACTURES): Vit D, 25-Hydroxy: 32 ng/mL (ref 30–89)

## 2014-06-13 LAB — LIPID PANEL
Cholesterol: 216 mg/dL — ABNORMAL HIGH (ref 0–200)
HDL: 53 mg/dL (ref >39)
LDL Cholesterol: 129 mg/dL — ABNORMAL HIGH (ref 0–99)
Total CHOL/HDL Ratio: 4.1 Ratio
Triglycerides: 170 mg/dL — ABNORMAL HIGH (ref <150)
VLDL: 34 mg/dL (ref 0–40)

## 2014-06-13 LAB — IRON: Iron: 81 ug/dL (ref 42–145)

## 2014-06-13 LAB — TSH: TSH: 2.409 u[IU]/mL (ref 0.350–4.500)

## 2014-06-13 LAB — FERRITIN: FERRITIN: 10 ng/mL (ref 10–291)

## 2014-06-14 ENCOUNTER — Encounter: Payer: Self-pay | Admitting: Family Medicine

## 2014-06-14 ENCOUNTER — Ambulatory Visit (INDEPENDENT_AMBULATORY_CARE_PROVIDER_SITE_OTHER): Payer: Medicare Other | Admitting: Family Medicine

## 2014-06-14 VITALS — BP 108/68 | HR 100 | Resp 20 | Wt 237.0 lb

## 2014-06-14 DIAGNOSIS — R7989 Other specified abnormal findings of blood chemistry: Secondary | ICD-10-CM

## 2014-06-14 DIAGNOSIS — R790 Abnormal level of blood mineral: Secondary | ICD-10-CM

## 2014-06-14 DIAGNOSIS — E785 Hyperlipidemia, unspecified: Secondary | ICD-10-CM

## 2014-06-14 DIAGNOSIS — E8881 Metabolic syndrome: Secondary | ICD-10-CM

## 2014-06-14 DIAGNOSIS — K121 Other forms of stomatitis: Secondary | ICD-10-CM

## 2014-06-14 DIAGNOSIS — F411 Generalized anxiety disorder: Secondary | ICD-10-CM

## 2014-06-14 DIAGNOSIS — K1370 Unspecified lesions of oral mucosa: Secondary | ICD-10-CM

## 2014-06-14 DIAGNOSIS — R079 Chest pain, unspecified: Secondary | ICD-10-CM

## 2014-06-14 DIAGNOSIS — E039 Hypothyroidism, unspecified: Secondary | ICD-10-CM

## 2014-06-14 HISTORY — DX: Abnormal level of blood mineral: R79.0

## 2014-06-14 MED ORDER — ALPRAZOLAM 1 MG PO TABS: ORAL_TABLET | ORAL | Status: DC

## 2014-06-14 MED ORDER — DICLOFENAC SODIUM 1 % TD GEL: 2.0000 g | Freq: Four times a day (QID) | TRANSDERMAL | Status: DC

## 2014-06-14 NOTE — Patient Instructions (Addendum)
F./u in 4 month, call if you need me before  The sore in your mouth is a small ulcer, I recommend topical medication , like oragel for pain relief   No redness or exudate in the back of your throat, no indication for antibiotics at this time  New for neck pain is voltaren gel twice daily  You are referred to hematology clinic for low ferrtin and to Dr Harl Bowie for cardiology evaluation  Fasting lipid, cmkp and EGFr, HBA1C and TSH in 4 month

## 2014-06-14 NOTE — Progress Notes (Signed)
   Subjective:    Patient ID: [name], female    DOB: 11-20-1961, 53 y.o.   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 Denies sinus pressure, nasal congestion, ear pain or sore throat. Denies chest congestion, productive cough or wheezing. Intermittent chest pains, denies palpitations, orthopnea or PND Intermittent  abdominal pain and  Nausea.denies , vomiting,diarrhea or constipation.   Denies dysuria, frequency, hesitancy or incontinence. Chronic  joint pain, swelling and limitation in mobility. Denies headaches, seizures, numbness, or tingling. Denies depression, uncontrolled  anxiety or insomnia.       Objective:   Physical Exam BP 108/68  Pulse 100  Resp 20  Wt 237 lb (107.502 kg)  SpO2 96% Patient alert and oriented and in no cardiopulmonary distress.  HEENT: No facial asymmetry, EOMI,   oropharynx pink and moist.No exudate, apthous ulcers present   Neck supple no JVD, no mass.No lymphadenopathy  Chest: Clear to auscultation bilaterally.  CVS: S1, S2 no murmurs, no S3.Regular rate.  ABD: Soft non tender.   Ext: No edema  MS: decreased  ROM spine, shoulders, hips and knees.  Skin: Intact, no ulcerations or rash noted.  Psych: Good eye contact, normal affect. Memory intact  anxious not depressed appearing.  CNS: CN 2-12 intact, power,  normal throughout.no  focal deficits noted.        Assessment & Plan:  Oral ulceration Topical pain relieving gel, OTC, no bacterial superinfection noted  Low ferritin level Refer to hematology for f/u  Chest pain, unspecified Intermittent , atypical chest pain. Past h/o ab;lation, wants to establish with local cardiologist and choses Dr Harl Bowie, will refer as reuested  ANXIETY, CHRONIC unchanged , continue current medication  HYPERLIPIDEMIA Uncontrolled Hyperlipidemia:Low fat diet discussed and encouraged.  Updated lab needed at/ before next visit. Pt has noot been able to take meds as consistently as desired, has had intestinal problems  Unspecified hypothyroidism Controlled, no change in medication

## 2014-06-25 ENCOUNTER — Other Ambulatory Visit: Payer: Self-pay | Admitting: Family Medicine

## 2014-06-25 DIAGNOSIS — Z1231 Encounter for screening mammogram for malignant neoplasm of breast: Secondary | ICD-10-CM

## 2014-06-25 DIAGNOSIS — C50012 Malignant neoplasm of nipple and areola, left female breast: Secondary | ICD-10-CM

## 2014-06-26 ENCOUNTER — Encounter (HOSPITAL_COMMUNITY): Payer: Self-pay

## 2014-06-26 ENCOUNTER — Encounter (HOSPITAL_COMMUNITY): Payer: Medicare Other | Attending: Hematology and Oncology

## 2014-06-26 VITALS — BP 119/89 | HR 103 | Temp 98.3°F | Resp 16 | Wt 242.2 lb

## 2014-06-26 DIAGNOSIS — C50919 Malignant neoplasm of unspecified site of unspecified female breast: Secondary | ICD-10-CM

## 2014-06-26 DIAGNOSIS — D509 Iron deficiency anemia, unspecified: Secondary | ICD-10-CM | POA: Insufficient documentation

## 2014-06-26 DIAGNOSIS — K632 Fistula of intestine: Secondary | ICD-10-CM | POA: Insufficient documentation

## 2014-06-26 DIAGNOSIS — Z800 Family history of malignant neoplasm of digestive organs: Secondary | ICD-10-CM | POA: Insufficient documentation

## 2014-06-26 DIAGNOSIS — I10 Essential (primary) hypertension: Secondary | ICD-10-CM | POA: Insufficient documentation

## 2014-06-26 DIAGNOSIS — K2100 Gastro-esophageal reflux disease with esophagitis, without bleeding: Secondary | ICD-10-CM

## 2014-06-26 DIAGNOSIS — C50012 Malignant neoplasm of nipple and areola, left female breast: Secondary | ICD-10-CM

## 2014-06-26 DIAGNOSIS — Q438 Other specified congenital malformations of intestine: Secondary | ICD-10-CM | POA: Insufficient documentation

## 2014-06-26 DIAGNOSIS — Z9049 Acquired absence of other specified parts of digestive tract: Secondary | ICD-10-CM | POA: Insufficient documentation

## 2014-06-26 DIAGNOSIS — L988 Other specified disorders of the skin and subcutaneous tissue: Secondary | ICD-10-CM

## 2014-06-26 DIAGNOSIS — E039 Hypothyroidism, unspecified: Secondary | ICD-10-CM

## 2014-06-26 LAB — COMPREHENSIVE METABOLIC PANEL
ALT: 20 U/L (ref 0–35)
AST: 29 U/L (ref 0–37)
Albumin: 3.9 g/dL (ref 3.5–5.2)
Alkaline Phosphatase: 71 U/L (ref 39–117)
BUN: 22 mg/dL (ref 6–23)
CO2: 23 mEq/L (ref 19–32)
CREATININE: 0.94 mg/dL (ref 0.50–1.10)
Calcium: 9.4 mg/dL (ref 8.4–10.5)
Chloride: 103 mEq/L (ref 96–112)
GFR calc Af Amer: 79 mL/min — ABNORMAL LOW (ref >90)
GFR calc non Af Amer: 69 mL/min — ABNORMAL LOW (ref >90)
Glucose, Bld: 94 mg/dL (ref 70–99)
Potassium: 4.4 mEq/L (ref 3.7–5.3)
Sodium: 141 mEq/L (ref 137–147)
TOTAL PROTEIN: 7.5 g/dL (ref 6.0–8.3)
Total Bilirubin: 0.2 mg/dL — ABNORMAL LOW (ref 0.3–1.2)

## 2014-06-26 LAB — CBC WITH DIFFERENTIAL/PLATELET
BASOS ABS: 0 10*3/uL (ref 0.0–0.1)
Basophils Relative: 1 % (ref 0–1)
Eosinophils Absolute: 0.1 10*3/uL (ref 0.0–0.7)
Eosinophils Relative: 1 % (ref 0–5)
HCT: 38.8 % (ref 36.0–46.0)
Hemoglobin: 12.8 g/dL (ref 12.0–15.0)
LYMPHS ABS: 2.4 10*3/uL (ref 0.7–4.0)
Lymphocytes Relative: 38 % (ref 12–46)
MCH: 27.5 pg (ref 26.0–34.0)
MCHC: 33 g/dL (ref 30.0–36.0)
MCV: 83.4 fL (ref 78.0–100.0)
Monocytes Absolute: 0.4 10*3/uL (ref 0.1–1.0)
Monocytes Relative: 7 % (ref 3–12)
NEUTROS PCT: 53 % (ref 43–77)
Neutro Abs: 3.3 10*3/uL (ref 1.7–7.7)
Platelets: 256 10*3/uL (ref 150–400)
RBC: 4.65 MIL/uL (ref 3.87–5.11)
RDW: 14.9 % (ref 11.5–15.5)
WBC: 6.2 10*3/uL (ref 4.0–10.5)

## 2014-06-26 LAB — RETICULOCYTES
RBC.: 4.65 MIL/uL (ref 3.87–5.11)
RETIC CT PCT: 2.1 % (ref 0.4–3.1)
Retic Count, Absolute: 97.7 10*3/uL (ref 19.0–186.0)

## 2014-06-26 LAB — IRON AND TIBC
IRON: 56 ug/dL (ref 42–135)
Saturation Ratios: 12 % — ABNORMAL LOW (ref 20–55)
TIBC: 481 ug/dL — ABNORMAL HIGH (ref 250–470)
UIBC: 425 ug/dL — ABNORMAL HIGH (ref 125–400)

## 2014-06-26 LAB — SEDIMENTATION RATE: SED RATE: 17 mm/h (ref 0–22)
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
## 2014-06-26 NOTE — Progress Notes (Signed)
[name]'s reason for visit today are for MD visit and labs as scheduled per MD orders.  Venipuncture performed with a 21 gauge butterfly needle to R Antecubital.  [name] tolerated venipuncture well and without incident; questions were answered and patient was discharged.

## 2014-06-26 NOTE — Patient Instructions (Addendum)
.  [facility] Discharge Instructions  RECOMMENDATIONS MADE BY THE CONSULTANT AND ANY TEST RESULTS WILL BE SENT TO YOUR REFERRING PHYSICIAN.  EXAM FINDINGS BY THE PHYSICIAN TODAY AND SIGNS OR SYMPTOMS TO REPORT TO CLINIC OR PRIMARY PHYSICIAN: Exam and findings as discussed by Dr. Barnet Glasgow.  MEDICATIONS PRESCRIBED:  feraheme infusion this week and next week  INSTRUCTIONS/FOLLOW-UP: 6 weeks with cbc and ferritin   Thank you for choosing [facility] to provide your oncology and hematology care.  To afford each patient quality time with our providers, please arrive at least 15 minutes before your scheduled appointment time.  With your help, our goal is to use those 15 minutes to complete the necessary work-up to ensure our physicians have the information they need to help with your evaluation and healthcare recommendations.    Effective January 1st, 2014, we ask that you re-schedule your appointment with our physicians should you arrive 10 or more minutes late for your appointment.  We strive to give you quality time with our providers, and arriving late affects you and other patients whose appointments are after yours.    Again, thank you for choosing [facility].  Our hope is that these requests will decrease the amount of time that you wait before being seen by our physicians.       _____________________________________________________________  Should you have questions after your visit to [facility], please contact our office at [phone] between the hours of 8:30 a.m. and 4:30 p.m.  Voicemails left after 4:30 p.m. will not be returned until the following business day.  For prescription refill requests, have your pharmacy contact our office with your prescription refill request.    _______________________________________________________________  We hope that we have given you very good care.  You may receive a patient  satisfaction survey in the mail, please complete it and return it as soon as possible.  We value your feedback!  _______________________________________________________________  Have you asked about our STAR program?  STAR stands for Survivorship Training and Rehabilitation, and this is a nationally recognized cancer care program that focuses on survivorship and rehabilitation.  Cancer and cancer treatments may cause problems, such as, pain, making you feel tired and keeping you from doing the things that you need or want to do. Cancer rehabilitation can help. Our goal is to reduce these troubling effects and help you have the best quality of life possible.  You may receive a survey from a nurse that asks questions about your current state of health.  Based on the survey results, all eligible patients will be referred to the [facility] program for an evaluation so we can better serve you!  A frequently asked questions sheet is available upon request.

## 2014-06-27 ENCOUNTER — Encounter (HOSPITAL_COMMUNITY): Payer: Medicare Other | Attending: Hematology and Oncology

## 2014-06-27 VITALS — BP 119/83 | HR 95 | Temp 98.4°F | Resp 18

## 2014-06-27 DIAGNOSIS — K632 Fistula of intestine: Secondary | ICD-10-CM

## 2014-06-27 DIAGNOSIS — R790 Abnormal level of blood mineral: Secondary | ICD-10-CM

## 2014-06-27 DIAGNOSIS — D509 Iron deficiency anemia, unspecified: Secondary | ICD-10-CM

## 2014-06-27 DIAGNOSIS — R7989 Other specified abnormal findings of blood chemistry: Secondary | ICD-10-CM | POA: Insufficient documentation

## 2014-06-27 LAB — FOLATE: FOLATE: 12.6 ng/mL

## 2014-06-27 LAB — FERRITIN: FERRITIN: 9 ng/mL — AB (ref 10–291)

## 2014-06-27 LAB — CANCER ANTIGEN 27.29: CA 27.29: 5 U/mL (ref 0–39)

## 2014-06-27 LAB — VITAMIN B12: VITAMIN B 12: 270 pg/mL (ref 211–911)

## 2014-06-27 LAB — INTRINSIC FACTOR ANTIBODIES: Intrinsic Factor: NEGATIVE

## 2014-06-27 LAB — CEA: CEA: <0.5 ng/mL (ref 0.0–5.0)

## 2014-06-27 MED ORDER — ALTEPLASE 2 MG IJ SOLR: 2.0000 mg | Freq: Once | INTRAMUSCULAR | Status: DC | PRN

## 2014-06-27 MED ORDER — HEPARIN SOD (PORK) LOCK FLUSH 100 UNIT/ML IV SOLN: 500.0000 [IU] | Freq: Once | INTRAVENOUS | Status: DC | PRN

## 2014-06-27 MED ORDER — HEPARIN SOD (PORK) LOCK FLUSH 100 UNIT/ML IV SOLN: 250.0000 [IU] | Freq: Once | INTRAVENOUS | Status: DC | PRN

## 2014-06-27 MED ORDER — SODIUM CHLORIDE 0.9 % IJ SOLN: 10.0000 mL | INTRAMUSCULAR | Status: DC | PRN

## 2014-06-27 MED ORDER — SODIUM CHLORIDE 0.9 % IV SOLN
Freq: Once | INTRAVENOUS | Status: AC
  Administered 2014-06-27: 14:00:00 via INTRAVENOUS

## 2014-06-27 MED ORDER — SODIUM CHLORIDE 0.9 % IV SOLN
510.0000 mg | Freq: Once | INTRAVENOUS | Status: AC
  Administered 2014-06-27: 510 mg via INTRAVENOUS
  Filled 2014-06-27: qty 17

## 2014-06-27 MED ORDER — SODIUM CHLORIDE 0.9 % IJ SOLN: 3.0000 mL | Freq: Once | INTRAMUSCULAR | Status: DC | PRN

## 2014-06-27 NOTE — Progress Notes (Signed)
Tolerated feraheme infusion well; no s/s of infusion reaction. A&ox4; in no distress.

## 2014-06-28 ENCOUNTER — Ambulatory Visit
Admission: RE | Admit: 2014-06-28 | Discharge: 2014-06-28 | Disposition: A | Discharge status: Home / Self Care | Payer: Medicare Other | Source: Ambulatory Visit | Attending: Family Medicine | Admitting: Family Medicine

## 2014-06-28 DIAGNOSIS — C50012 Malignant neoplasm of nipple and areola, left female breast: Secondary | ICD-10-CM

## 2014-06-28 DIAGNOSIS — Z1231 Encounter for screening mammogram for malignant neoplasm of breast: Secondary | ICD-10-CM

## 2014-06-28 LAB — ANTI-PARIETAL ANTIBODY: Parietal Cell Antibody-IgG: NEGATIVE

## 2014-07-03 ENCOUNTER — Ambulatory Visit (HOSPITAL_COMMUNITY): Payer: Medicare Other

## 2014-07-04 ENCOUNTER — Encounter (HOSPITAL_BASED_OUTPATIENT_CLINIC_OR_DEPARTMENT_OTHER): Payer: Medicare Other

## 2014-07-04 VITALS — BP 123/73 | HR 86 | Temp 98.3°F | Resp 18

## 2014-07-04 DIAGNOSIS — R790 Abnormal level of blood mineral: Secondary | ICD-10-CM

## 2014-07-04 DIAGNOSIS — D509 Iron deficiency anemia, unspecified: Secondary | ICD-10-CM

## 2014-07-04 DIAGNOSIS — K632 Fistula of intestine: Secondary | ICD-10-CM

## 2014-07-04 MED ORDER — SODIUM CHLORIDE 0.9 % IJ SOLN
10.0000 mL | INTRAMUSCULAR | Status: DC | PRN
  Administered 2014-07-04: 10 mL

## 2014-07-04 MED ORDER — HEPARIN SOD (PORK) LOCK FLUSH 100 UNIT/ML IV SOLN: 250.0000 [IU] | Freq: Once | INTRAVENOUS | Status: DC | PRN

## 2014-07-04 MED ORDER — SODIUM CHLORIDE 0.9 % IV SOLN
510.0000 mg | Freq: Once | INTRAVENOUS | Status: AC
  Administered 2014-07-04: 510 mg via INTRAVENOUS
  Filled 2014-07-04: qty 17

## 2014-07-04 MED ORDER — SODIUM CHLORIDE 0.9 % IV SOLN
Freq: Once | INTRAVENOUS | Status: AC
  Administered 2014-07-04: 14:00:00 via INTRAVENOUS

## 2014-07-04 NOTE — Progress Notes (Signed)
Pt tolerated feraheme infusion well.

## 2014-07-04 NOTE — Progress Notes (Signed)
This encounter was created in error - please disregard.

## 2014-07-17 ENCOUNTER — Ambulatory Visit (INDEPENDENT_AMBULATORY_CARE_PROVIDER_SITE_OTHER): Payer: Medicare Other | Admitting: Family Medicine

## 2014-07-17 VITALS — BP 136/84 | HR 97 | Resp 18

## 2014-07-17 DIAGNOSIS — R350 Frequency of micturition: Secondary | ICD-10-CM

## 2014-07-17 DIAGNOSIS — R112 Nausea with vomiting, unspecified: Secondary | ICD-10-CM | POA: Insufficient documentation

## 2014-07-17 DIAGNOSIS — G8929 Other chronic pain: Secondary | ICD-10-CM | POA: Insufficient documentation

## 2014-07-17 DIAGNOSIS — M549 Dorsalgia, unspecified: Secondary | ICD-10-CM

## 2014-07-17 DIAGNOSIS — E785 Hyperlipidemia, unspecified: Secondary | ICD-10-CM

## 2014-07-17 DIAGNOSIS — R110 Nausea: Secondary | ICD-10-CM

## 2014-07-17 HISTORY — DX: Nausea: R11.0

## 2014-07-17 HISTORY — DX: Dorsalgia, unspecified: M54.9

## 2014-07-17 MED ORDER — ONDANSETRON HCL 4 MG PO TABS: 4.0000 mg | ORAL_TABLET | Freq: Three times a day (TID) | ORAL | Status: DC | PRN

## 2014-07-17 MED ORDER — PREDNISONE (PAK) 5 MG PO TABS: 5.0000 mg | ORAL_TABLET | ORAL | Status: DC

## 2014-07-17 MED ORDER — HYDROMORPHONE HCL 2 MG PO TABS: ORAL_TABLET | ORAL | Status: DC

## 2014-07-17 NOTE — Progress Notes (Signed)
   Subjective:    Patient ID: [name], female    DOB: 21-Jun-1961, 53 y.o.   MRN: [id number]  HPI    Review of Systems     Objective:   Physical Exam        Assessment & Plan:

## 2014-07-17 NOTE — Progress Notes (Signed)
Subjective:    Patient ID: [name], female    DOB: 15-Aug-1961, 53 y.o.   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 Still has a bag for stool, but also has had rectal passage of stool since 2012 Dr Harl Bowie is the Doc who most recently did procedure, pt reports being d/c with no instruction sheet or medications, and no follow up scheduled. States she has increased pain, both in her lower abdomen and in her low back. Reports no sleep due to severe back pain, said she did go to the ED and was advised she would have 2.5 hr wait. Has made attempts to contat one of her physicians at [facility] so far with no success C/o nausea, no emesis, no fever or chills, urinary frequency present States her back pain , which is low back pain, was so severe she has been using heating pad to the back, and wants me to check to see if her skin is burned. Pt last received dilaudid #30 tabs from Dr. Dossie Der at [facility] in March, she is running out of pai meds, which she reports generally not taking on a regular basis, and is now here in svere pain, mostly low back, and asking for help. She has had imaging studies ofher entire spine within the past 12 months, 04/2014, lumbar images reveal no significant neural impingement   Review of Systems See HPI Denies recent fever or chills. Denies sinus pressure, nasal congestion, ear pain or sore throat. Denies chest congestion, productive cough or wheezing. Denies chest pains, palpitations and leg swelling   Denies headaches, seizures, numbness, or tingling.       Objective:   Physical Exam  BP 136/84  Pulse 97  Resp 18  SpO2 93% Patient alert and oriented and in no cardiopulmonary  distress.Pt in pain, appears to have no comfortable position  HEENT: No facial asymmetry, EOMI,   oropharynx pink and moist.  Neck supple no JVD, no mass.  Chest: Clear to auscultation bilaterally.  CVS: S1, S2 no murmurs, no S3.Regular rate.  ABD: Soft obese,not distended, no localized tenderness. Pouch draining loose brown stool, no visible blood in stool ,  Ext: No edema  MS: decreased ROM lumbar  Spine due to pain,  Skin: Intact, no ulcerations or rash noted.no erythema or burning in low back in area of concern  Psych: Good eye contact, normal affect. Memory intact not anxious or depressed appearing.  CNS: CN 2-12 intact, power,  normal throughout.no focal deficits noted.       Assessment & Plan:  Urinary frequency cCUA checked in office and is normal  Nausea alone Zofran administered in office and also prescribed  Back pain with radiation Severe low back pain radiating to abdomen following recent attempt at fistula closure.Abdomen not distended, no localized tenderness, fistula poch contains liquid stool. Depo medrool administered for back pain  From chronic arthritis, and prednisone dose pack prescribed. Script for 30 dilaudid tabs provided , identical to one pt had received form [facility] approx 4 months ago also. There is no plan to treat pt for chronic pain through this office, however due to symptom severity script was provided  BACK PAIN, CHRONIC Pt has  been referred to neurosurgeon re neck pain with abnormality in c spine, has not kept appt as yet, but likelihood of surgery being her best option is low, she has had multiple surgeries (abdominal) in the past with complications . Will need to consider referral top pain specialist, she has been on oxycontin long term in the past,but should she need chronic pain management again , I would definitely defer that to a pain specialist Current c/o back  pain radiating to abdomen warrants re evaluation by surgeon asap, and I have  made this clear  HYPERLIPIDEMIA Uncontrolled, pt to lower fat intake and take meds as prescribed

## 2014-07-17 NOTE — Patient Instructions (Addendum)
F/u as before  For back pain , depo medrol 80 mg im in office and prednisone dose pack also dilaudid #30 tabs only being written today  You ABSOLUTELY NEED to be re evaluated at [facility] within the next 24 to 48 hours due to the new pain you are having following the procedure you recently had.  If no relief with pain meds from here, do not wait to be seen , but go to  the nearest ED   If unable to directly contact the surgeon (Dr Harl Bowie) or his staff or colleagues  Then you should go to the ED there.  Script of zofran also sent in   Skin on low back shows no physical burn  Urine is being tested for infection  Hope you soon feel better

## 2014-07-18 ENCOUNTER — Telehealth: Payer: Self-pay | Admitting: Family Medicine

## 2014-07-18 ENCOUNTER — Encounter: Payer: Self-pay | Admitting: Family Medicine

## 2014-07-18 DIAGNOSIS — R350 Frequency of micturition: Secondary | ICD-10-CM | POA: Insufficient documentation

## 2014-07-18 HISTORY — DX: Frequency of micturition: R35.0

## 2014-07-18 LAB — POCT URINALYSIS DIPSTICK
Bilirubin, UA: NEGATIVE
Blood, UA: NEGATIVE
GLUCOSE UA: NEGATIVE
Ketones, UA: NEGATIVE
Leukocytes, UA: NEGATIVE
NITRITE UA: NEGATIVE
PROTEIN UA: NEGATIVE
Urobilinogen, UA: 0.2
pH, UA: 6

## 2014-07-18 MED ORDER — ONDANSETRON HCL 4 MG/2ML IJ SOLN
4.0000 mg | Freq: Once | INTRAMUSCULAR | Status: AC
  Administered 2014-07-18: 4 mg via INTRAMUSCULAR

## 2014-07-18 MED ORDER — METHYLPREDNISOLONE ACETATE 80 MG/ML IJ SUSP
80.0000 mg | Freq: Once | INTRAMUSCULAR | Status: AC
  Administered 2014-07-18: 80 mg via INTRAMUSCULAR

## 2014-07-18 NOTE — Telephone Encounter (Signed)
Pls contact pt , let her know that I hope doing better .I spoke directly with pt , states was able to sleep at 2am  Let her know on review, she was referred to Dr Ellene Route re her neck pain , bur seems as though her whole chronioc neck and back pain may be becoming  More of a problem I recommend she also see pain specialist about chronic pain, suggest Dr Lionel December) or Dr Nelva Bush or Elta Guadeloupe phillips in [place], if she wants to think about this and get back , OK but  Not a good idea to depened on toradol and  Depo medrol in bursts if she has chronic pain (was on chronic pain meds in the past but does not want to resume that option I know and understand, but may need to consider) pls make it clear that I am still very concerned about her severe LBP radiating to her abdomen and that she needs to see a Provider at [facility] as was discussed  I was able to speak directly with Pt she heard and understood my concern, has an appt with Dr Ellene Route in August and will consider chronic pain management option, stated she was appreciativeof the call

## 2014-07-18 NOTE — Assessment & Plan Note (Signed)
Uncontrolled, pt to lower fat intake and take meds as prescribed

## 2014-07-18 NOTE — Assessment & Plan Note (Signed)
Pt has been referred to neurosurgeon re neck pain with abnormality in c spine, has not kept appt as yet, but likelihood of surgery being her best option is low, she has had multiple surgeries (abdominal) in the past with complications . Will need to consider referral top pain specialist, she has been on oxycontin long term in the past,but should she need chronic pain management again , I would definitely defer that to a pain specialist Current c/o back  pain radiating to abdomen warrants re evaluation by surgeon asap, and I have made this clear

## 2014-07-18 NOTE — Addendum Note (Signed)
Addended by: Denman George B on: 07/18/2014 08:55 AM   Modules accepted: Orders

## 2014-07-18 NOTE — Assessment & Plan Note (Signed)
Severe low back pain radiating to abdomen following recent attempt at fistula closure.Abdomen not distended, no localized tenderness, fistula poch contains liquid stool. Depo medrool administered for back pain  From chronic arthritis, and prednisone dose pack prescribed. Script for 30 dilaudid tabs provided , identical to one pt had received form [facility] approx 4 months ago also. There is no plan to treat pt for chronic pain through this office, however due to symptom severity script was provided

## 2014-07-18 NOTE — Assessment & Plan Note (Signed)
cCUA checked in office and is normal

## 2014-07-18 NOTE — Assessment & Plan Note (Signed)
Zofran administered in office and also prescribed

## 2014-07-23 ENCOUNTER — Encounter: Payer: Self-pay | Admitting: Cardiology

## 2014-07-23 ENCOUNTER — Encounter: Payer: Self-pay | Admitting: *Deleted

## 2014-07-23 ENCOUNTER — Ambulatory Visit (INDEPENDENT_AMBULATORY_CARE_PROVIDER_SITE_OTHER): Payer: Medicare Other | Admitting: Cardiology

## 2014-07-23 VITALS — BP 120/86 | HR 99 | Ht 64.0 in | Wt 244.0 lb

## 2014-07-23 DIAGNOSIS — R079 Chest pain, unspecified: Secondary | ICD-10-CM

## 2014-07-23 NOTE — Patient Instructions (Signed)
Your physician recommends that you schedule a follow-up appointment in: to be determined  Your physician has requested that you have en exercise stress myoview. For further information please visit [email or web address]. Please follow instruction sheet, as given.

## 2014-07-23 NOTE — Progress Notes (Signed)
Clinical Summary [name] is a 53 y.o.female seen today as a new patient. She was seen by Dr Caryl Comes several years ago, but has not been followed since.    1. SVT/Afib - history of prior ablations, the exact history is somewhat unclear based on available notes. Was prevoiusly followed by Dr Caryl Comes several years ago - aflutter ablation 1996 with repeat 1997. - denies any recent palpitations.   2. Chest pain - started approx 2 years ago. Sharp pain midchest, radiates to neck and throat. 10/10. Lasts just a few seconds, longest up to 2 minutes. Feels very dizzy with episodes, no other symptoms. Can occur at rest or with exertion. Increase frequency, now daily. Increased severity. Not positional.    - she is worried about upcoming abdominal surgery and her progressing chest pain symptoms - notes some DOE at 1/2 block as well.   3. Multiple abdominal surgeries - multiple surgeries for abdominal fistula, ongoing evaluation  Past Medical History  Diagnosis Date  . SVT (supraventricular tachycardia)   . Hypercholesterolemia   . Bilateral ovarian cysts   . Perforation bowel   . PE (pulmonary embolism)   . Thyroid disease   . GERD (gastroesophageal reflux disease)   . Sepsis(995.91)   . E coli infection   . Anxiety   . Pneumonia   . Depression   . Carpal tunnel syndrome   . DVT (deep venous thrombosis)   . Anemia   . Irregular heartbeat   . Stroke   . DVT (deep venous thrombosis)   . Blood transfusion without reported diagnosis   . Clotting disorder   . Fibromyalgia   . Hx of migraines   . Interstitial cystitis   . Cancer     left breast   . Acute renal failure     renal faliure with surgery      Allergies  Allergen Reactions  . Bisacodyl Other (See Comments)    Makes patient feel like she is having cramps  . Clarithromycin Hives  . Clarithromycin Other (See Comments)    Cluster migraines   . Clindamycin Hcl Hives  . Codeine Itching  . Keflex [Cephalexin] Hives     Trouble breathing  . Monosodium Glutamate Other (See Comments)    Cluster migraines  . Nitrofurantoin Hives  . Penicillins Hives    Passed out  . Scopolamine Hbr Other (See Comments)    Cluster migraines, impaired vision  . Sulfonamide Derivatives Hives  . Tape Other (See Comments)    Blistering, use paper tape      Current Outpatient Prescriptions  Medication Sig Dispense Refill  . ALPRAZolam (XANAX) 1 MG tablet TAKE 1 TABLET BY MOUTH UP TO 4 TIMES DAILY AS NEEDED.  120 tablet  1  . butalbital-acetaminophen-caffeine (FIORICET, ESGIC) 50-325-40 MG per tablet Take 1 tablet by mouth 2 (two) times daily as needed. For migraines Please deliver  14 tablet  1  . conjugated estrogens (PREMARIN) vaginal cream Place vaginally.      . cyclobenzaprine (FLEXERIL) 10 MG tablet Take 10 mg by mouth 3 (three) times daily as needed for muscle spasms.      . diclofenac sodium (VOLTAREN) 1 % GEL Apply 2 g topically 4 (four) times daily.  100 g  0  . esomeprazole (NEXIUM) 40 MG capsule Take 1 capsule (40 mg total) by mouth 2 (two) times daily before a meal.  180 capsule  1  . fenofibrate (TRICOR) 145 MG tablet Take 1 tablet (145  mg total) by mouth daily.  90 tablet  1  . HYDROmorphone (DILAUDID) 2 MG tablet One to two tablets every 4 hours , as needed, for uncontrolled back  pain  30 tablet  0  . levothyroxine (SYNTHROID, LEVOTHROID) 125 MCG tablet Take 1 tablet (125 mcg total) by mouth daily before breakfast.  90 tablet  1  . Multiple Vitamins-Minerals (ICAPS) CAPS Take by mouth at bedtime.       . ondansetron (ZOFRAN) 4 MG tablet Take 1 tablet (4 mg total) by mouth every 8 (eight) hours as needed for nausea or vomiting.  30 tablet  0  . polyethylene glycol (MIRALAX / GLYCOLAX) packet daily. Take 17 g by mouth 2 (two) times daily. Mix in 4-8ounces of fluid prior to taking.      . pravastatin (PRAVACHOL) 80 MG tablet Take 1 tablet (80 mg total) by mouth at bedtime.  90 tablet  1  . predniSONE  (STERAPRED UNI-PAK) 5 MG TABS tablet Take 1 tablet (5 mg total) by mouth as directed.  21 tablet  0  . senna-docusate (SENOKOT-S) 8.6-50 MG per tablet Take 2 tablets by mouth 3 (three) times daily as needed. constipation      . verapamil (CALAN) 120 MG tablet Take 120 mg by mouth daily.      . vitamin C (ASCORBIC ACID) 500 MG tablet Take 500 mg by mouth daily.       No current facility-administered medications for this visit.     Past Surgical History  Procedure Laterality Date  . Heart ablation    . Vena cava filter placement    . Abdominal hysterectomy    . Hernia repair    . Bowel resection    . Carpal tunnel release    . Bowel fistula    . Breast enhancement surgery  1983  . Removal of breast implants  1993  . Cesarean section  1986  . Colon surgery    . Cosmetic surgery    . Enterocutaneous fistula closure N/A 08/09/2013    [facility], Dr Dossie Der     Allergies  Allergen Reactions  . Bisacodyl Other (See Comments)    Makes patient feel like she is having cramps  . Clarithromycin Hives  . Clarithromycin Other (See Comments)    Cluster migraines   . Clindamycin Hcl Hives  . Codeine Itching  . Keflex [Cephalexin] Hives    Trouble breathing  . Monosodium Glutamate Other (See Comments)    Cluster migraines  . Nitrofurantoin Hives  . Penicillins Hives    Passed out  . Scopolamine Hbr Other (See Comments)    Cluster migraines, impaired vision  . Sulfonamide Derivatives Hives  . Tape Other (See Comments)    Blistering, use paper tape       Family History  Problem Relation Age of Onset  . Diabetes Mother   . Heart disease Mother   . Hypertension Mother   . Heart disease Father   . Hyperlipidemia Father   . Hypertension Father   . Cancer Maternal Aunt     colon  . Cancer Maternal Uncle     mets  . Cancer Maternal Grandfather     bone/mets  . Cancer Cousin 19    ovarian  . Cancer Maternal Uncle     prostate     Social History [name] reports that she  has never smoked. She has never used smokeless tobacco. [name] reports that she does not drink alcohol.   Review  of Systems CONSTITUTIONAL: No weight loss, fever, chills, weakness or fatigue.  HEENT: Eyes: No visual loss, blurred vision, double vision or yellow sclerae.No hearing loss, sneezing, congestion, runny nose or sore throat.  SKIN: No rash or itching.  CARDIOVASCULAR: per HPI RESPIRATORY: No shortness of breath, cough or sputum.  GASTROINTESTINAL: No anorexia, nausea, vomiting or diarrhea. No abdominal pain or blood.  GENITOURINARY: No burning on urination, no polyuria NEUROLOGICAL: No headache, dizziness, syncope, paralysis, ataxia, numbness or tingling in the extremities. No change in bowel or bladder control.  MUSCULOSKELETAL: No muscle, back pain, joint pain or stiffness.  LYMPHATICS: No enlarged nodes. No history of splenectomy.  PSYCHIATRIC: No history of depression or anxiety.  ENDOCRINOLOGIC: No reports of sweating, cold or heat intolerance. No polyuria or polydipsia.  Marland Kitchen   Physical Examination p 99 bp 120/86 Wt 244 lbs BMI 42 Gen: resting comfortably, no acute distress HEENT: no scleral icterus, pupils equal round and reactive, no palptable cervical adenopathy,  CV: RRR, no m/r/g, no jVD, no carotid bruits Resp: Clear to auscultation bilaterally GI: abdomen is soft, non-tender, non-distended, normal bowel sounds, no hepatosplenomegaly MSK: extremities are warm, no edema.  Skin: warm, no rash Neuro:  no focal deficits Psych: appropriate affect   Diagnostic Studies 06/2003 Cath RESULTS: The aortic pressure was 91/59 with a mean of 75. Left ventricular  pressure was 91/4.  Left main coronary artery: Free of significant disease.  Left anterior descending artery: Gave rise to a diagonal Aaryn Parrilla and a  septal perforator. ________ proper were free of significant disease.  Circumflex artery: The circumflex artery was a dominant vessel, gave rise  to marginal  Charles Andringa, an atrial Sidrah Harden, a posterolateral Rowe Warman, and a  posterior descending Jerrold Haskell. These vessels were free of significant  disease.  Right coronary artery: The right coronary artery is a nondominant vessel  that supplied only two right ventricular branches. There was some catheter  induced spasm at the proximal portion of the right coronary artery but the  vessel was otherwise free of significant disease.  LEFT VENTRICULOGRAM: The left ventriculogram performed in the RAO  projection showed good wall motion with no areas of hypokinesis. The  estimated ejection fraction was 60%.  CONCLUSIONS: Normal coronary angiography and normal left ventricular wall  motion.  RECOMMENDATIONS: Reassurance. I will discuss with Deboraha Sprang, M.D.  regarding the need for further evaluation of her chest pain.      Assessment and Plan  1. SVT/Afib - history of multiple ablations, no evidence of recurrence. No current symptoms - continue to follow clinically  2. Chest pain - fairly atypical symptoms. Concerning that it is progressinve in frequency and severity, and has been associated with progressiing DOE. She also has an upcoming abdominal surgery that she is worried about her cardiac risk for given her symptoms. EKG shows sinus tach, low voltage, non-specific ST/T changes.  - will obtain exercise nuclear stress test to further evaluate, 2 day protocol given her body habitus. She states she is comfortable running with her abdominal wound dressing.    F/u pending stress results   Arnoldo Lenis, M.D., F.A.C.C.

## 2014-07-24 NOTE — Addendum Note (Signed)
Addended by: Hilarie Fredrickson T on: 07/24/2014 10:46 AM   Modules accepted: Orders

## 2014-07-25 ENCOUNTER — Encounter (HOSPITAL_COMMUNITY): Payer: Medicare Other

## 2014-07-25 ENCOUNTER — Inpatient Hospital Stay (HOSPITAL_COMMUNITY): Admission: RE | Admit: 2014-07-25 | Payer: Medicare Other | Source: Ambulatory Visit

## 2014-07-26 ENCOUNTER — Encounter (HOSPITAL_COMMUNITY): Payer: Medicare Other

## 2014-07-27 ENCOUNTER — Encounter (HOSPITAL_COMMUNITY): Payer: Medicare Other

## 2014-08-01 ENCOUNTER — Encounter (HOSPITAL_COMMUNITY): Payer: Medicare Other

## 2014-08-01 ENCOUNTER — Inpatient Hospital Stay (HOSPITAL_COMMUNITY): Admission: RE | Admit: 2014-08-01 | Payer: Medicare Other | Source: Ambulatory Visit

## 2014-08-01 ENCOUNTER — Other Ambulatory Visit (HOSPITAL_COMMUNITY): Payer: Medicare Other

## 2014-08-02 ENCOUNTER — Other Ambulatory Visit (HOSPITAL_COMMUNITY): Payer: Medicare Other

## 2014-08-02 ENCOUNTER — Encounter (HOSPITAL_COMMUNITY): Payer: Medicare Other

## 2014-08-06 ENCOUNTER — Other Ambulatory Visit (HOSPITAL_COMMUNITY): Payer: Medicare Other

## 2014-08-07 ENCOUNTER — Ambulatory Visit (HOSPITAL_COMMUNITY): Payer: Medicare Other

## 2014-08-07 ENCOUNTER — Other Ambulatory Visit (HOSPITAL_COMMUNITY): Payer: Medicare Other

## 2014-08-08 NOTE — Progress Notes (Signed)
This encounter was created in error - please disregard.

## 2014-08-09 ENCOUNTER — Telehealth: Payer: Self-pay | Admitting: *Deleted

## 2014-08-09 DIAGNOSIS — R079 Chest pain, unspecified: Secondary | ICD-10-CM

## 2014-08-09 DIAGNOSIS — I471 Supraventricular tachycardia: Secondary | ICD-10-CM

## 2014-08-09 NOTE — Telephone Encounter (Signed)
Pt would like a referral to see Dr. Lyndle Herrlich. Please advise [phone] pt said she would rather stay with him since she has been seeing him for years, pt stated she has been having chest pains and she would rather be with Dr. Vanita Ingles. Dr. Moshe Cipro has referred her to a Doctor already and pt would rather see Dr. Vanita Ingles

## 2014-08-14 ENCOUNTER — Other Ambulatory Visit (HOSPITAL_COMMUNITY): Payer: Medicare Other

## 2014-08-15 NOTE — Telephone Encounter (Signed)
pls refer her to cardiologist Brett Canales card [facility] for follow up, I will re order, [pt should be encouraged to let Dr Harl Bowie know that she prefers to return to "old /previous " cardiologist. I am not sure how soon Drkline will be able to seeher but I am entering referral

## 2014-08-17 ENCOUNTER — Ambulatory Visit (HOSPITAL_COMMUNITY): Payer: Medicare Other

## 2014-08-17 ENCOUNTER — Other Ambulatory Visit (HOSPITAL_COMMUNITY): Payer: Medicare Other

## 2014-08-20 ENCOUNTER — Encounter (HOSPITAL_COMMUNITY): Payer: Self-pay | Admitting: Emergency Medicine

## 2014-08-20 ENCOUNTER — Emergency Department (HOSPITAL_COMMUNITY): Payer: Medicare Other

## 2014-08-20 ENCOUNTER — Emergency Department (HOSPITAL_COMMUNITY)
Admission: EM | Admit: 2014-08-20 | Discharge: 2014-08-20 | Disposition: A | Discharge status: Home / Self Care | Payer: Medicare Other | Attending: Emergency Medicine | Admitting: Emergency Medicine

## 2014-08-20 DIAGNOSIS — Z8742 Personal history of other diseases of the female genital tract: Secondary | ICD-10-CM | POA: Diagnosis not present

## 2014-08-20 DIAGNOSIS — Z8619 Personal history of other infectious and parasitic diseases: Secondary | ICD-10-CM | POA: Diagnosis not present

## 2014-08-20 DIAGNOSIS — E7800 Pure hypercholesterolemia, unspecified: Secondary | ICD-10-CM | POA: Insufficient documentation

## 2014-08-20 DIAGNOSIS — R300 Dysuria: Secondary | ICD-10-CM | POA: Diagnosis present

## 2014-08-20 DIAGNOSIS — F3289 Other specified depressive episodes: Secondary | ICD-10-CM | POA: Diagnosis not present

## 2014-08-20 DIAGNOSIS — Z880 Allergy status to penicillin: Secondary | ICD-10-CM | POA: Diagnosis not present

## 2014-08-20 DIAGNOSIS — K219 Gastro-esophageal reflux disease without esophagitis: Secondary | ICD-10-CM | POA: Insufficient documentation

## 2014-08-20 DIAGNOSIS — Z8589 Personal history of malignant neoplasm of other organs and systems: Secondary | ICD-10-CM | POA: Insufficient documentation

## 2014-08-20 DIAGNOSIS — E079 Disorder of thyroid, unspecified: Secondary | ICD-10-CM | POA: Diagnosis not present

## 2014-08-20 DIAGNOSIS — Z8673 Personal history of transient ischemic attack (TIA), and cerebral infarction without residual deficits: Secondary | ICD-10-CM | POA: Diagnosis not present

## 2014-08-20 DIAGNOSIS — Z8679 Personal history of other diseases of the circulatory system: Secondary | ICD-10-CM | POA: Diagnosis not present

## 2014-08-20 DIAGNOSIS — F329 Major depressive disorder, single episode, unspecified: Secondary | ICD-10-CM | POA: Insufficient documentation

## 2014-08-20 DIAGNOSIS — R109 Unspecified abdominal pain: Secondary | ICD-10-CM | POA: Diagnosis not present

## 2014-08-20 DIAGNOSIS — IMO0001 Reserved for inherently not codable concepts without codable children: Secondary | ICD-10-CM | POA: Insufficient documentation

## 2014-08-20 DIAGNOSIS — Z79899 Other long term (current) drug therapy: Secondary | ICD-10-CM | POA: Diagnosis not present

## 2014-08-20 DIAGNOSIS — Z791 Long term (current) use of non-steroidal anti-inflammatories (NSAID): Secondary | ICD-10-CM | POA: Diagnosis not present

## 2014-08-20 DIAGNOSIS — Z8701 Personal history of pneumonia (recurrent): Secondary | ICD-10-CM | POA: Diagnosis not present

## 2014-08-20 DIAGNOSIS — M545 Low back pain, unspecified: Secondary | ICD-10-CM | POA: Diagnosis not present

## 2014-08-20 DIAGNOSIS — F411 Generalized anxiety disorder: Secondary | ICD-10-CM | POA: Diagnosis not present

## 2014-08-20 DIAGNOSIS — Z86718 Personal history of other venous thrombosis and embolism: Secondary | ICD-10-CM | POA: Diagnosis not present

## 2014-08-20 DIAGNOSIS — Z86711 Personal history of pulmonary embolism: Secondary | ICD-10-CM | POA: Diagnosis not present

## 2014-08-20 LAB — URINALYSIS, ROUTINE W REFLEX MICROSCOPIC
Bilirubin Urine: NEGATIVE
Glucose, UA: NEGATIVE mg/dL
Hgb urine dipstick: NEGATIVE
KETONES UR: NEGATIVE mg/dL
LEUKOCYTES UA: NEGATIVE
NITRITE: NEGATIVE
PH: 6 (ref 5.0–8.0)
PROTEIN: NEGATIVE mg/dL
Specific Gravity, Urine: 1.02 (ref 1.005–1.030)
UROBILINOGEN UA: 0.2 mg/dL (ref 0.0–1.0)

## 2014-08-20 MED ORDER — OXYCODONE-ACETAMINOPHEN 5-325 MG PO TABS: 1.0000 | ORAL_TABLET | Freq: Four times a day (QID) | ORAL | Status: DC | PRN

## 2014-08-20 MED ORDER — OXYCODONE-ACETAMINOPHEN 5-325 MG PO TABS: 2.0000 | ORAL_TABLET | ORAL | Status: DC | PRN

## 2014-08-20 MED ORDER — HYDROMORPHONE HCL PF 2 MG/ML IJ SOLN
2.0000 mg | Freq: Once | INTRAMUSCULAR | Status: AC
  Administered 2014-08-20: 2 mg via INTRAMUSCULAR
  Filled 2014-08-20: qty 1

## 2014-08-20 MED ORDER — HYDROMORPHONE HCL PF 2 MG/ML IJ SOLN
2.0000 mg | Freq: Once | INTRAMUSCULAR | Status: DC
Start: 2014-08-20 — End: 2014-08-20
  Filled 2014-08-20: qty 1

## 2014-08-20 MED ORDER — LORAZEPAM 2 MG/ML IJ SOLN
1.0000 mg | Freq: Once | INTRAMUSCULAR | Status: AC
  Administered 2014-08-20: 1 mg via INTRAVENOUS
  Filled 2014-08-20: qty 1

## 2014-08-20 MED ORDER — OXYCODONE-ACETAMINOPHEN 5-325 MG PO TABS
1.0000 | ORAL_TABLET | Freq: Four times a day (QID) | ORAL | Status: DC | PRN
Start: 2014-08-20 — End: 2015-02-28

## 2014-08-20 MED ORDER — HYDROMORPHONE HCL PF 1 MG/ML IJ SOLN
1.0000 mg | Freq: Once | INTRAMUSCULAR | Status: AC
  Administered 2014-08-20: 1 mg via INTRAVENOUS
  Filled 2014-08-20: qty 1

## 2014-08-20 NOTE — ED Notes (Signed)
Pt with sudden pain to right lower abd with urination, states she is not able to walk due to pain, recent multiple surgeries and pt states she has abd hernia, + nausea

## 2014-08-20 NOTE — Discharge Instructions (Signed)
Follow up with dr. Moshe Cipro or your md at [facility] this week.

## 2014-08-21 ENCOUNTER — Telehealth: Payer: Self-pay

## 2014-08-21 NOTE — Telephone Encounter (Signed)
Spoke directly with the patient , states unable to walk , when she stands get to 10 plus in pain. No fever, chills, no visible blood in urine Was in ED yesterday, prescribed percocet, cause of pain is unknown, no relief.Abdominal scan and Ua yesterday were non revealing I advised go directly back to the ED. States does not feel like back pain or intestinal blockage, she is having bM and she only feels nauseated because of [pain. Agrees to go to the ED this pm when spouse gets home

## 2014-08-21 NOTE — ED Provider Notes (Signed)
CSN: [id number]     Arrival date & time 08/20/14  1709 History   First MD Initiated Contact with Patient 08/20/14 1947     Chief Complaint  Patient presents with  . Dysuria     (Consider location/radiation/quality/duration/timing/severity/associated sxs/prior Treatment) Patient is a 53 y.o. female presenting with dysuria. The history is provided by the patient (the pt complains of right flank pain radiating into her right leg and groin).  Dysuria Pain quality:  Burning Pain severity:  Moderate Onset quality:  Sudden Timing:  Constant Progression:  Worsening Chronicity:  New Recent urinary tract infections: no   Worsened by:  Nothing tried Urinary symptoms: no discolored urine   Associated symptoms: flank pain   Associated symptoms: no abdominal pain     Past Medical History  Diagnosis Date  . SVT (supraventricular tachycardia)   . Hypercholesterolemia   . Bilateral ovarian cysts   . Perforation bowel   . PE (pulmonary embolism)   . Thyroid disease   . GERD (gastroesophageal reflux disease)   . Sepsis(995.91)   . E coli infection   . Anxiety   . Pneumonia   . Depression   . Carpal tunnel syndrome   . DVT (deep venous thrombosis)   . Anemia   . Irregular heartbeat   . Stroke   . DVT (deep venous thrombosis)   . Blood transfusion without reported diagnosis   . Clotting disorder   . Fibromyalgia   . Hx of migraines   . Interstitial cystitis   . Acute renal failure     renal faliure with surgery   . Cancer     left breast    Past Surgical History  Procedure Laterality Date  . Heart ablation    . Vena cava filter placement    . Abdominal hysterectomy    . Hernia repair    . Bowel resection    . Carpal tunnel release    . Bowel fistula    . Breast enhancement surgery  1983  . Removal of breast implants  1993  . Cesarean section  1986  . Colon surgery    . Cosmetic surgery    . Enterocutaneous fistula closure N/A 08/09/2013    [facility], Dr Dossie Der   Family  History  Problem Relation Age of Onset  . Diabetes Mother   . Heart disease Mother   . Hypertension Mother   . Heart disease Father   . Hyperlipidemia Father   . Hypertension Father   . Cancer Maternal Aunt     colon  . Cancer Maternal Uncle     mets  . Cancer Maternal Grandfather     bone/mets  . Cancer Cousin 19    ovarian  . Cancer Maternal Uncle     prostate   History  Substance Use Topics  . Smoking status: Never Smoker   . Smokeless tobacco: Never Used  . Alcohol Use: No     Comment: rare   OB History   Grav Para Term Preterm Abortions TAB SAB Ect Mult Living                 Review of Systems  Constitutional: Negative for appetite change and fatigue.  HENT: Negative for congestion, ear discharge and sinus pressure.   Eyes: Negative for discharge.  Respiratory: Negative for cough.   Cardiovascular: Negative for chest pain.  Gastrointestinal: Negative for abdominal pain and diarrhea.  Genitourinary: Positive for dysuria and flank pain. Negative for frequency and hematuria.  Musculoskeletal: Negative for back pain.  Skin: Negative for rash.  Neurological: Negative for seizures and headaches.  Psychiatric/Behavioral: Negative for hallucinations.      Allergies  Bisacodyl; Clarithromycin; Clarithromycin; Clindamycin hcl; Codeine; Keflex; Monosodium glutamate; Nitrofurantoin; Penicillins; Scopolamine hbr; Sulfonamide derivatives; and Tape  Home Medications   Prior to Admission medications   Medication Sig Start Date End Date Taking? Authorizing Provider  ALPRAZolam [name]) 1 MG tablet Take 1 mg by mouth 4 (four) times daily as needed for anxiety or sleep.   Yes Historical Provider, MD  butalbital-acetaminophen-caffeine (FIORICET, ESGIC) 50-325-40 MG per tablet Take 1 tablet by mouth 2 (two) times daily as needed. For migraines Please deliver 01/12/13  Yes Fayrene Helper, MD  conjugated estrogens (PREMARIN) vaginal cream Place 1 Applicatorful vaginally at  bedtime.  09/09/12  Yes Historical Provider, MD  cyclobenzaprine (FLEXERIL) 10 MG tablet Take 10 mg by mouth 3 (three) times daily as needed for muscle spasms.   Yes Historical Provider, MD  diclofenac sodium (VOLTAREN) 1 % GEL Apply 2 g topically 4 (four) times daily. 06/14/14  Yes Fayrene Helper, MD  esomeprazole (NEXIUM) 40 MG capsule Take 1 capsule (40 mg total) by mouth 2 (two) times daily before a meal. 12/05/13  Yes Fayrene Helper, MD  fenofibrate (TRICOR) 145 MG tablet Take 1 tablet (145 mg total) by mouth daily. 02/07/14  Yes Fayrene Helper, MD  levothyroxine (SYNTHROID, LEVOTHROID) 125 MCG tablet Take 1 tablet (125 mcg total) by mouth daily before breakfast. 02/07/14  Yes Fayrene Helper, MD  Multiple Vitamins-Minerals (ICAPS) CAPS Take by mouth at bedtime.    Yes Historical Provider, MD  polyethylene glycol (MIRALAX / GLYCOLAX) packet daily. Take 17 g by mouth 2 (two) times daily. Mix in 4-8ounces of fluid prior to taking.   Yes Historical Provider, MD  pravastatin (PRAVACHOL) 80 MG tablet Take 1 tablet (80 mg total) by mouth at bedtime. 02/07/14  Yes Fayrene Helper, MD  senna-docusate (SENOKOT-S) 8.6-50 MG per tablet Take 2 tablets by mouth 3 (three) times daily as needed. constipation   Yes Historical Provider, MD  verapamil (CALAN) 120 MG tablet Take 120 mg by mouth 2 (two) times daily.    Yes Historical Provider, MD  oxyCODONE-acetaminophen (PERCOCET) 5-325 MG per tablet Take 2 tablets by mouth every 4 (four) hours as needed. 08/20/14   Maudry Diego, MD  oxyCODONE-acetaminophen (PERCOCET) 5-325 MG per tablet Take 2 tablets by mouth every 4 (four) hours as needed. 08/20/14   Maudry Diego, MD  oxyCODONE-acetaminophen (PERCOCET/ROXICET) 5-325 MG per tablet Take 1 tablet by mouth every 6 (six) hours as needed. 08/20/14   Maudry Diego, MD  oxyCODONE-acetaminophen (PERCOCET/ROXICET) 5-325 MG per tablet Take 1 tablet by mouth every 6 (six) hours as needed. 08/20/14   Maudry Diego, MD   BP 109/72  Pulse 93  Temp(Src) 98.7 F (37.1 C) (Oral)  Resp 16  Ht 5\' 4"  (1.626 m)  Wt 244 lb (110.678 kg)  BMI 41.86 kg/m2  SpO2 95% Physical Exam  Constitutional: She is oriented to person, place, and time. She appears well-developed.  HENT:  Head: Normocephalic.  Eyes: Conjunctivae and EOM are normal. No scleral icterus.  Neck: Neck supple. No thyromegaly present.  Cardiovascular: Normal rate and regular rhythm.  Exam reveals no gallop and no friction rub.   No murmur heard. Pulmonary/Chest: No stridor. She has no wheezes. She has no rales. She exhibits no tenderness.  Abdominal: She exhibits no distension.  There is no tenderness. There is no rebound.  Genitourinary:  Tender right flank  Musculoskeletal: Normal range of motion. She exhibits no edema.  Lymphadenopathy:    She has no cervical adenopathy.  Neurological: She is oriented to person, place, and time. She exhibits normal muscle tone. Coordination normal.  Skin: No rash noted. No erythema.  Psychiatric: She has a normal mood and affect. Her behavior is normal.    ED Course  Procedures (including critical care time) Labs Review Labs Reviewed  URINALYSIS, ROUTINE W REFLEX MICROSCOPIC    Imaging Review Ct Abdomen Pelvis Wo Contrast  08/20/2014   CLINICAL DATA:  Evaluate first kidney stone. Sent pain right lower abdomen with urination. Recent multiple surgeries the abdomen. Known abdominal hernia. In the  EXAM: CT ABDOMEN AND PELVIS WITHOUT CONTRAST  TECHNIQUE: Multidetector CT imaging of the abdomen and pelvis was performed following the standard protocol without IV contrast.  COMPARISON:  CT abdomen and pelvis 02/04/2014 and 03/31/2013  FINDINGS: Lung bases: A right breast implant is partially visualized. There is streaky linear atelectasis in the right middle lobe and right lower lobe. Heart size is normal. Slight pleural thickening posteriorly on the left appears chronic. No definite pleural effusion.   Both kidneys are normal in size and contour. Negative for intrarenal stones or hydronephrosis. Negative for perinephric fluid or stranding.  Both ureters are normal in caliber. Negative for ureteral stone. The urinary bladder is slightly distended at the time of imaging. No bladder stone or wall thickening identified.  Again seen is a midline abdominal wall surgical defect with possible overlying bandage material. There are postsurgical changes of subtotal colectomy, with an enterocolonic anastomosis at the level of the sigmoid colon. Surgical suture is also seen in association with small bowel loops cephalad to the anastomosis. Anterior abdominal wall laxity is noted.  Status post hysterectomy.  Negative for adnexal mass.  Abdominal aorta normal in caliber. Inferior vena cava filter is in stable position, inferior to the right renal vein.  No acute or suspicious osseous abnormality.  IMPRESSION: 1. Negative for urinary tract disease or urinary tract obstruction. No secondary signs of recent stone passage. 2. No definite acute findings compared to prior abdominal pelvis CTs. 3. Persistent anterior abdominal wall defect with possible overlying bandage material. 4. Status post subtotal colectomy.   Electronically Signed   By: Curlene Dolphin M.D.   On: 08/20/2014 21:33     EKG Interpretation None      MDM   Final diagnoses:  Right-sided low back pain without sciatica        Maudry Diego, MD 08/21/14 1345

## 2014-08-21 NOTE — Telephone Encounter (Signed)
States Sunday am she went to void and had a severe pain shoot from the right side of her back (Right kidney area) through to her groin and down her right leg. Constant pain, worse with walking and when taking a deep breath. Pain got so bad last night that she went to ED but they couldn't figure out what it was. Still in a lot of pain and oxycodone not touching it. Michela Pitcher it could still be kidney stones even though none showed up on scan. Please advise what she needs to do. Rates pain at 10

## 2014-08-23 DIAGNOSIS — K121 Other forms of stomatitis: Secondary | ICD-10-CM | POA: Insufficient documentation

## 2014-08-23 HISTORY — DX: Other forms of stomatitis: K12.1

## 2014-08-23 NOTE — Assessment & Plan Note (Signed)
Refer to hematology for f/u

## 2014-08-23 NOTE — Assessment & Plan Note (Signed)
Intermittent , atypical chest pain. Past h/o ab;lation, wants to establish with local cardiologist and choses Dr Harl Bowie, will refer as reuested

## 2014-08-23 NOTE — Assessment & Plan Note (Signed)
Topical pain relieving gel, OTC, no bacterial superinfection noted

## 2014-08-23 NOTE — Assessment & Plan Note (Signed)
unchanged , continue current medication

## 2014-08-23 NOTE — Assessment & Plan Note (Signed)
Controlled, no change in medication  

## 2014-08-23 NOTE — Assessment & Plan Note (Signed)
Uncontrolled Hyperlipidemia:Low fat diet discussed and encouraged.  Updated lab needed at/ before next visit. Pt has noot been able to take meds as consistently as desired, has had intestinal problems

## 2014-08-29 MED FILL — Hydrocodone-Acetaminophen Tab 5-325 MG: ORAL | Qty: 6 | Status: AC

## 2014-08-30 ENCOUNTER — Encounter (HOSPITAL_COMMUNITY): Payer: Self-pay

## 2014-09-07 DIAGNOSIS — R109 Unspecified abdominal pain: Secondary | ICD-10-CM | POA: Insufficient documentation

## 2014-09-07 DIAGNOSIS — R10A1 Flank pain, right side: Secondary | ICD-10-CM | POA: Insufficient documentation

## 2014-09-21 ENCOUNTER — Encounter (HOSPITAL_COMMUNITY): Payer: Self-pay | Admitting: Hematology and Oncology

## 2014-10-01 ENCOUNTER — Ambulatory Visit: Payer: Medicare Other | Admitting: Internal Medicine

## 2014-10-02 ENCOUNTER — Telehealth: Payer: Self-pay | Admitting: Family Medicine

## 2014-10-02 NOTE — Telephone Encounter (Signed)
Wants to come in next week and get depo and toradol for back pain. Also wants a referral to a vascular surgeon because there is a problem with a filter she has in one of her arteries. I didn't understand everything she was taking about but she does want a referral. Ok to come in next week for pain injections?

## 2014-10-02 NOTE — Telephone Encounter (Signed)
[place] nurse visit for flu vaccine, toradol 60mg  and depo medrol 80 mg IM , pls document where her pain is , Need some info as to her vascular prob, has she seen anyone about this problem in the past etc so we can get info needed to refer

## 2014-10-03 ENCOUNTER — Encounter: Payer: Self-pay | Admitting: Internal Medicine

## 2014-10-03 NOTE — Telephone Encounter (Signed)
Called and left message for patient to return call.  

## 2014-10-12 ENCOUNTER — Other Ambulatory Visit: Payer: Self-pay

## 2014-10-16 ENCOUNTER — Ambulatory Visit: Payer: Medicare Other | Admitting: Family Medicine

## 2014-10-24 ENCOUNTER — Other Ambulatory Visit: Payer: Self-pay

## 2014-10-24 MED ORDER — FENOFIBRATE 145 MG PO TABS: 145.0000 mg | ORAL_TABLET | Freq: Every day | ORAL | Status: DC

## 2014-10-24 MED ORDER — PRAVASTATIN SODIUM 80 MG PO TABS: 80.0000 mg | ORAL_TABLET | Freq: Every day | ORAL | Status: DC

## 2014-10-24 MED ORDER — CYCLOBENZAPRINE HCL 10 MG PO TABS: 10.0000 mg | ORAL_TABLET | Freq: Three times a day (TID) | ORAL | Status: DC | PRN

## 2014-10-24 MED ORDER — LEVOTHYROXINE SODIUM 125 MCG PO TABS: 125.0000 ug | ORAL_TABLET | Freq: Every day | ORAL | Status: DC

## 2014-10-24 MED ORDER — ESOMEPRAZOLE MAGNESIUM 40 MG PO CPDR: 40.0000 mg | DELAYED_RELEASE_CAPSULE | Freq: Two times a day (BID) | ORAL | Status: DC

## 2014-11-10 DIAGNOSIS — G8918 Other acute postprocedural pain: Secondary | ICD-10-CM | POA: Insufficient documentation

## 2014-12-14 ENCOUNTER — Telehealth: Payer: Self-pay | Admitting: Family Medicine

## 2014-12-14 NOTE — Telephone Encounter (Signed)
Call from [facility] resident requesting that i sign on tPN orders for the pt which will come from a company that administers. I stated I wopuld be willing to do this,but I may need to seek help from local GI /nutrtion team , will see

## 2015-01-11 ENCOUNTER — Encounter: Payer: Self-pay | Admitting: Family Medicine

## 2015-02-22 ENCOUNTER — Other Ambulatory Visit: Payer: Self-pay

## 2015-02-22 ENCOUNTER — Telehealth: Payer: Self-pay | Admitting: Family Medicine

## 2015-02-22 DIAGNOSIS — E559 Vitamin D deficiency, unspecified: Secondary | ICD-10-CM

## 2015-02-22 DIAGNOSIS — E039 Hypothyroidism, unspecified: Secondary | ICD-10-CM

## 2015-02-22 DIAGNOSIS — D500 Iron deficiency anemia secondary to blood loss (chronic): Secondary | ICD-10-CM

## 2015-02-22 DIAGNOSIS — R790 Abnormal level of blood mineral: Secondary | ICD-10-CM

## 2015-02-22 DIAGNOSIS — R7301 Impaired fasting glucose: Secondary | ICD-10-CM

## 2015-02-22 DIAGNOSIS — E785 Hyperlipidemia, unspecified: Secondary | ICD-10-CM

## 2015-02-22 NOTE — Telephone Encounter (Signed)
Please ensure that you follow up labs ordered  To be drawn on 02/23/2015 per pt requesting espescialy that her ferritin level be checked.If there is any abnormality , in labs ordered the lab will need to be reviewed by the on call Provider and then you discuss this with the pt. On record review I see that when she was last checked although her CBC was normal her ferritin was very low, if this is again the case , she will need to be referred to hematology clinic (Cancer center at [facility] or wherever she prefers) pLS send referral on a stamped sheet pending my electronic signature , IF she needs to be seen the dx / reason will be low ferritin   ?? pls ask

## 2015-02-26 LAB — BASIC METABOLIC PANEL
BUN: 12 mg/dL (ref 6–23)
CALCIUM: 9 mg/dL (ref 8.4–10.5)
CO2: 25 mEq/L (ref 19–32)
Chloride: 103 mEq/L (ref 96–112)
Creat: 0.76 mg/dL (ref 0.50–1.10)
Glucose, Bld: 109 mg/dL — ABNORMAL HIGH (ref 70–99)
Potassium: 4.1 mEq/L (ref 3.5–5.3)
SODIUM: 141 meq/L (ref 135–145)

## 2015-02-26 LAB — TSH: TSH: 0.904 u[IU]/mL (ref 0.350–4.500)

## 2015-02-26 LAB — CBC WITH DIFFERENTIAL/PLATELET
Basophils Absolute: 0 10*3/uL (ref 0.0–0.1)
Basophils Relative: 0 % (ref 0–1)
Eosinophils Absolute: 0.1 10*3/uL (ref 0.0–0.7)
Eosinophils Relative: 1 % (ref 0–5)
HCT: 38.6 % (ref 36.0–46.0)
Hemoglobin: 12.2 g/dL (ref 12.0–15.0)
LYMPHS PCT: 28 % (ref 12–46)
Lymphs Abs: 1.7 10*3/uL (ref 0.7–4.0)
MCH: 25.8 pg — AB (ref 26.0–34.0)
MCHC: 31.6 g/dL (ref 30.0–36.0)
MCV: 81.8 fL (ref 78.0–100.0)
MONO ABS: 0.3 10*3/uL (ref 0.1–1.0)
MONOS PCT: 5 % (ref 3–12)
MPV: 12.8 fL — ABNORMAL HIGH (ref 8.6–12.4)
Neutro Abs: 4 10*3/uL (ref 1.7–7.7)
Neutrophils Relative %: 66 % (ref 43–77)
Platelets: 218 10*3/uL (ref 150–400)
RBC: 4.72 MIL/uL (ref 3.87–5.11)
RDW: 15.1 % (ref 11.5–15.5)
WBC: 6.1 10*3/uL (ref 4.0–10.5)

## 2015-02-26 LAB — LIPID PANEL
CHOLESTEROL: 129 mg/dL (ref 0–200)
HDL: 42 mg/dL — ABNORMAL LOW (ref >=46)
LDL Cholesterol: 51 mg/dL (ref 0–99)
TRIGLYCERIDES: 180 mg/dL — AB (ref <150)
Total CHOL/HDL Ratio: 3.1 Ratio
VLDL: 36 mg/dL (ref 0–40)

## 2015-02-26 LAB — VITAMIN D 25 HYDROXY (VIT D DEFICIENCY, FRACTURES): VIT D 25 HYDROXY: 28 ng/mL — AB (ref 30–100)

## 2015-02-26 LAB — FERRITIN: Ferritin: 6 ng/mL — ABNORMAL LOW (ref 10–291)

## 2015-02-26 LAB — HEMOGLOBIN A1C
HEMOGLOBIN A1C: 6.1 % — AB (ref <5.7)
MEAN PLASMA GLUCOSE: 128 mg/dL — AB (ref <117)

## 2015-02-26 NOTE — Telephone Encounter (Signed)
This is not urgent so waiting for the hematologist to see her should be okay. Please make sure that she has done a recent FOBT.

## 2015-02-26 NOTE — Addendum Note (Signed)
Addended by: Eual Fines on: 02/26/2015 01:15 PM   Modules accepted: Orders

## 2015-02-26 NOTE — Telephone Encounter (Signed)
Patient test showed very low (ferritin)it was a 6 and she is referred to hematology clinic as recommended by Dr. Moshe Cipro. TSH and vit d pending. Does anything need to be done while we wait on hematology appt?

## 2015-02-27 ENCOUNTER — Other Ambulatory Visit: Payer: Self-pay | Admitting: Family Medicine

## 2015-02-27 NOTE — Telephone Encounter (Signed)
Pt has appt on the 10th of march

## 2015-02-28 ENCOUNTER — Encounter (HOSPITAL_COMMUNITY): Payer: Self-pay | Admitting: Oncology

## 2015-02-28 ENCOUNTER — Encounter (HOSPITAL_COMMUNITY): Payer: Medicaid Other | Attending: Hematology & Oncology | Admitting: Oncology

## 2015-02-28 VITALS — BP 129/82 | HR 92 | Temp 98.6°F | Resp 20 | Wt 216.5 lb

## 2015-02-28 DIAGNOSIS — E039 Hypothyroidism, unspecified: Secondary | ICD-10-CM

## 2015-02-28 DIAGNOSIS — Z91199 Patient's noncompliance with other medical treatment and regimen due to unspecified reason: Secondary | ICD-10-CM | POA: Insufficient documentation

## 2015-02-28 DIAGNOSIS — Z90722 Acquired absence of ovaries, bilateral: Secondary | ICD-10-CM | POA: Diagnosis not present

## 2015-02-28 DIAGNOSIS — D509 Iron deficiency anemia, unspecified: Secondary | ICD-10-CM

## 2015-02-28 DIAGNOSIS — Z9119 Patient's noncompliance with other medical treatment and regimen: Secondary | ICD-10-CM

## 2015-02-28 DIAGNOSIS — K909 Intestinal malabsorption, unspecified: Secondary | ICD-10-CM | POA: Diagnosis not present

## 2015-02-28 DIAGNOSIS — Z9079 Acquired absence of other genital organ(s): Secondary | ICD-10-CM

## 2015-02-28 HISTORY — DX: Patient's noncompliance with other medical treatment and regimen: Z91.19

## 2015-02-28 HISTORY — DX: Patient's noncompliance with other medical treatment and regimen due to unspecified reason: Z91.199

## 2015-02-28 NOTE — Assessment & Plan Note (Signed)
Secondary to malabsorption from multiple intestinal surgeries versus chronic GI blood loss.  She has a complicated GI history and this is followed at [facility].  She has a low ferritin with a normal Hgb, MCV, RDW, and MCHC.  She notes symptoms of iron deficiency.  Will set the patient up for IV Feraheme 510 mg this week, followed by labs in 6 weeks and 12 weeks: CBC diff, iron/TIBC, and ferritin.  Due to lack of laboratory follow-up following her last IV iron, I cannot determine the patient's response to therapy.  Thus the single dose of IV Feraheme 510 mg.  In 6 weeks, if her ferritin is still low, I would order another dose of IV feraheme.  Return in 12 weeks for follow-up.

## 2015-02-28 NOTE — Progress Notes (Signed)
[name], MD [address] [place] [address]  Iron deficiency anemia - Plan: ferumoxytol (FERAHEME) 510 mg in sodium chloride 0.9 % 100 mL IVPB, CBC with Differential, Ferritin, Iron and TIBC, CBC with Differential, Ferritin, Iron and TIBC  Medically noncompliant  H/O bilateral salpingo-oophorectomy  CURRENT THERAPY: Observation  INTERVAL HISTORY: [name] 54 y.o. female returns for followup of iron deficiency anemia. AND history of Paget's disease of the left breast status post resection with breast reconstruction with a left sided TRAM flap. This was done at [facility] with her initial diagnosis of Paget's disease in 2006. AND  Medical noncompliance with multiple missed appointments and lab appointments since June 2015: 08/06/2014, 08/07/2014, 08/14/2014, 08/17/2014.  She reports that she has been in and out of [facility] over the past number of months and that is the reason for missed appointments.   The patient has a complicated GI history and is followed at [facility] by Dr. Dossie Der.  According to [facility] note from Nov 2015: Briefly, the pt had a TAC in the 1990s for redundant colon with primary anastomosis, and subsequently had an enterotomy during a gynecologic surgery in 2012 that lead to a persistent ECF, despite numerous attempts at take down by Dr. Dossie Der. She has a history of blood clots, with PE and has an IVC filter in place, not on home anticoagulation. She has had an endoscopy and colonoscopy in the past year. She has had frequent admission, and was recently admitted for abdominal pain. She is being pre-admitted for surgery tomorrow. She is scheduled for an ex lap, ECF takedown, sm bowel resection.  I personally reviewed and went over laboratory results with the patient.  The results are noted within this dictation.  I personally reviewed and went over radiographic studies with the patient.  The results are noted  within this dictation.  Mammogram in July 2015 was BIRADS 1.  She will be due this coming July for annual screening mammogram.  She notes dizziness, shortness of breath, dyspnea on exertion, not sleeping well, and fatigue/tiredness.  She denies any blood in stool, black tarry stool, and hematuria.  She is followed closely from a GI standpoint with recent endoscopy and colonoscopy per [facility]    She tolerated her previous iron infusions without difficulty.    Hematologically, she has a negative ROS.  Past Medical History  Diagnosis Date  . SVT (supraventricular tachycardia)   . Hypercholesterolemia   . Bilateral ovarian cysts   . Perforation bowel   . PE (pulmonary embolism)   . Thyroid disease   . GERD (gastroesophageal reflux disease)   . Sepsis(995.91)   . E coli infection   . Anxiety   . Pneumonia   . Depression   . Carpal tunnel syndrome   . DVT (deep venous thrombosis)   . Anemia   . Irregular heartbeat   . Stroke   . DVT (deep venous thrombosis)   . Blood transfusion without reported diagnosis   . Clotting disorder   . Fibromyalgia   . Hx of migraines   . Interstitial cystitis   . Acute renal failure     renal faliure with surgery   . Cancer     left breast   . Medically noncompliant 02/28/2015  . H/O bilateral salpingo-oophorectomy 02/28/2015    has Paget's disease of female breast; Hypothyroidism; Hyperlipidemia LDL goal <100; OBESITY; Iron deficiency anemia due to chronic blood loss;  ANXIETY, CHRONIC; DEPRESSION; CARPAL TUNNEL SYNDROME, RIGHT; OVARIAN CYST; NECK PAIN, CHRONIC; BACK PAIN, CHRONIC; FIBROMYALGIA; Other Malaise and Fatigue; UNSPECIFIED TACHYCARDIA; SUPRAVENTRICULAR TACHYCARDIA, HX OF; SMALL BOWEL OBSTRUCTION, HX OF; INTERSTITIAL CYSTITIS; DISORDER OF BONE AND CARTILAGE UNSPECIFIED; Metabolic syndrome X; Fistula; Abdominal wound dehiscence; Depression, major; Postsurgical menopause; Insomnia; Fatigue; Routine general medical examination at a health  care facility; Headache disorder; Hoarseness, chronic; Morbid obesity; GERD (gastroesophageal reflux disease); Seasonal allergies; Irregular heart rate; Chest pain, unspecified; Low ferritin level; Nausea alone; Back pain with radiation; Urinary frequency; Oral ulceration; Medically noncompliant; and H/O bilateral salpingo-oophorectomy on her problem list.     is allergic to bisacodyl; clarithromycin; clarithromycin; clindamycin hcl; codeine; keflex; monosodium glutamate; nitrofurantoin; penicillins; scopolamine hbr; sulfonamide derivatives; and tape.  Ms. Finau had no medications administered during this visit.  Past Surgical History  Procedure Laterality Date  . Heart ablation    . Vena cava filter placement    . Abdominal hysterectomy    . Hernia repair    . Bowel resection    . Carpal tunnel release    . Bowel fistula    . Breast enhancement surgery  1983  . Removal of breast implants  1993  . Cesarean section  1986  . Colon surgery    . Cosmetic surgery    . Enterocutaneous fistula closure N/A 08/09/2013    [facility], Dr Dossie Der    Denies any headaches, dizziness, double vision, fevers, chills, night sweats, nausea, vomiting, diarrhea, constipation, chest pain, heart palpitations, shortness of breath, blood in stool, black tarry stool, urinary pain, urinary burning, urinary frequency, hematuria.   PHYSICAL EXAMINATION  ECOG PERFORMANCE STATUS: 1 - Symptomatic but completely ambulatory  Filed Vitals:   02/28/15 1104  BP: 129/82  Pulse: 92  Temp: 98.6 F (37 C)  Resp: 20    GENERAL:alert, no distress, well nourished, well developed, comfortable, cooperative and obese SKIN: skin color, texture, turgor are normal, no rashes or significant lesions HEAD: Normocephalic, No masses, lesions, tenderness or abnormalities EYES: normal, PERRLA, EOMI, Conjunctiva are pink and non-injected EARS: External ears normal OROPHARYNX:mucous membranes are moist  NECK: supple, no adenopathy,  thyroid normal size, non-tender, without nodularity, no stridor, non-tender, trachea midline LYMPH:  no palpable lymphadenopathy BREAST:not examined LUNGS: clear to auscultation and percussion HEART: regular rate & rhythm, no murmurs, no gallops, S1 normal and S2 normal ABDOMEN:obese BACK: Back symmetric, no curvature. EXTREMITIES:less then 2 second capillary refill, no joint deformities, effusion, or inflammation, no skin discoloration, no cyanosis  NEURO: alert & oriented x 3 with fluent speech, no focal motor/sensory deficits, gait normal    LABORATORY DATA: CBC    Component Value Date/Time   WBC 6.1 02/25/2015 1439   RBC 4.72 02/25/2015 1439   RBC 4.65 06/26/2014 1449   HGB 12.2 02/25/2015 1439   HCT 38.6 02/25/2015 1439   PLT 218 02/25/2015 1439   MCV 81.8 02/25/2015 1439   MCH 25.8* 02/25/2015 1439   MCHC 31.6 02/25/2015 1439   RDW 15.1 02/25/2015 1439   LYMPHSABS 1.7 02/25/2015 1439   MONOABS 0.3 02/25/2015 1439   EOSABS 0.1 02/25/2015 1439   BASOSABS 0.0 02/25/2015 1439      Chemistry      Component Value Date/Time   NA 141 02/25/2015 1432   K 4.1 02/25/2015 1432   CL 103 02/25/2015 1432   CO2 25 02/25/2015 1432   BUN 12 02/25/2015 1432   CREATININE 0.76 02/25/2015 1432   CREATININE 0.94 06/26/2014 1449  Component Value Date/Time   CALCIUM 9.0 02/25/2015 1432   ALKPHOS 71 06/26/2014 1449   AST 29 06/26/2014 1449   ALT 20 06/26/2014 1449   BILITOT 0.2* 06/26/2014 1449      Lab Results  Component Value Date   FERRITIN 6* 02/25/2015    RADIOGRAPHIC STUDIES:  07/03/2014  CLINICAL DATA: Screening.  EXAM: DIGITAL SCREENING UNILATERAL RIGHT MAMMOGRAM WITH CAD  The patient has a retropectoral implant. Standard and implant displaced views were performed.  COMPARISON: Previous exam(s)  ACR Breast Density Category b: There are scattered areas of fibroglandular density.  FINDINGS: The patient has had a left mastectomy. There are no  findings suspicious for malignancy. Images were processed with CAD.  IMPRESSION: No mammographic evidence of malignancy. A result letter of this screening mammogram will be mailed directly to the patient.  RECOMMENDATION: Screening mammogram in one year. (Code:SM-B-01Y)  BI-RADS CATEGORY 1: Negative.   Electronically Signed  By: Luberta Robertson M.D.  On: 07/03/2014 09:17   ASSESSMENT AND PLAN:  Iron deficiency anemia due to chronic blood loss Secondary to malabsorption from multiple intestinal surgeries versus chronic GI blood loss.  She has a complicated GI history and this is followed at [facility].  She has a low ferritin with a normal Hgb, MCV, RDW, and MCHC.  She notes symptoms of iron deficiency.  Will set the patient up for IV Feraheme 510 mg this week, followed by labs in 6 weeks and 12 weeks: CBC diff, iron/TIBC, and ferritin.  Due to lack of laboratory follow-up following her last IV iron, I cannot determine the patient's response to therapy.  Thus the single dose of IV Feraheme 510 mg.  In 6 weeks, if her ferritin is still low, I would order another dose of IV feraheme.  Return in 12 weeks for follow-up.   THERAPY PLAN:  We will monitor labs, including iron studies, and provide IV iron as needed.  All questions were answered. The patient knows to call the clinic with any problems, questions or concerns. We can certainly see the patient much sooner if necessary.  Patient and plan discussed with Dr. Ancil Linsey and she is in agreement with the aforementioned.   This note is electronically signed by: Robynn Pane 02/28/2015 11:53 AM

## 2015-02-28 NOTE — Patient Instructions (Signed)
[place] at [facility] Discharge Instructions  RECOMMENDATIONS MADE BY THE CONSULTANT AND ANY TEST RESULTS WILL BE SENT TO YOUR REFERRING PHYSICIAN.  We will schedule you for IV Feraheme. Lab work again in 6 weeks and 12 weeks. MD appointment in 12 weeks. Return as scheduled.  Thank you for choosing [place] at [facility] to provide your oncology and hematology care.  To afford each patient quality time with our provider, please arrive at least 15 minutes before your scheduled appointment time.    You need to re-schedule your appointment should you arrive 10 or more minutes late.  We strive to give you quality time with our providers, and arriving late affects you and other patients whose appointments are after yours.  Also, if you no show three or more times for appointments you may be dismissed from the clinic at the providers discretion.     Again, thank you for choosing [facility].  Our hope is that these requests will decrease the amount of time that you wait before being seen by our physicians.       _____________________________________________________________  Should you have questions after your visit to [place], please contact our office at [phone] between the hours of 8:30 a.m. and 4:30 p.m.  Voicemails left after 4:30 p.m. will not be returned until the following business day.  For prescription refill requests, have your pharmacy contact our office.

## 2015-03-01 ENCOUNTER — Encounter (HOSPITAL_BASED_OUTPATIENT_CLINIC_OR_DEPARTMENT_OTHER): Payer: Medicaid Other

## 2015-03-01 ENCOUNTER — Encounter: Payer: Self-pay | Admitting: Dietician

## 2015-03-01 ENCOUNTER — Encounter (HOSPITAL_COMMUNITY): Payer: Self-pay

## 2015-03-01 VITALS — BP 100/55 | HR 98 | Temp 97.7°F | Resp 20

## 2015-03-01 DIAGNOSIS — D509 Iron deficiency anemia, unspecified: Secondary | ICD-10-CM

## 2015-03-01 DIAGNOSIS — K909 Intestinal malabsorption, unspecified: Secondary | ICD-10-CM | POA: Diagnosis not present

## 2015-03-01 MED ORDER — SODIUM CHLORIDE 0.9 % IV SOLN
510.0000 mg | Freq: Once | INTRAVENOUS | Status: AC
  Administered 2015-03-01: 510 mg via INTRAVENOUS
  Filled 2015-03-01: qty 17

## 2015-03-01 NOTE — Progress Notes (Signed)
Patient tolerated IV iron well.

## 2015-03-01 NOTE — Progress Notes (Signed)
Patient identified to be at risk for malnutrition on the MST secondary to weight loss and poor appetite  Contacted Pt by Phone  Wt Readings from Last 10 Encounters:  02/28/15 216 lb 8 oz (98.204 kg)  08/20/14 244 lb (110.678 kg)  07/23/14 244 lb (110.678 kg)  06/26/14 242 lb 3.2 oz (109.861 kg)  06/14/14 237 lb (107.502 kg)  02/07/14 240 lb 0.6 oz (108.881 kg)  02/04/14 235 lb (106.595 kg)  10/25/13 225 lb (102.059 kg)  07/25/13 242 lb 0.6 oz (109.789 kg)  05/10/13 236 lb 1.9 oz (107.103 kg)    Patient weight has decreased by ~25 pounds in 6 months  Patient reports oral intake as fair and is suffering from symptoms including chronic pain. Pt says she has nerve issues in her back and has been seeking pain management. The pain in her back has caused her to lose some of her appetite.   Pt has had complex GI history, just transitioned off of TPN and yesterday had her first solid food. She mentioned that she has been sipping on protein shakes from [facility] . She said that she cant stand the taste of boost/ensure and did not desire any coupons for them  Pt seemed to be very thankful for the call  Did not mail anything at this time.   Burtis Junes RD, LDN Nutrition Pager: [phone] 03/01/2015 10:50 AM

## 2015-03-07 ENCOUNTER — Ambulatory Visit (HOSPITAL_COMMUNITY): Payer: Medicaid Other | Admitting: Oncology

## 2015-03-11 ENCOUNTER — Ambulatory Visit (HOSPITAL_COMMUNITY): Payer: Medicaid Other | Admitting: Hematology & Oncology

## 2015-03-20 ENCOUNTER — Ambulatory Visit (HOSPITAL_COMMUNITY): Payer: Medicaid Other | Admitting: Oncology

## 2015-03-26 ENCOUNTER — Encounter (HOSPITAL_BASED_OUTPATIENT_CLINIC_OR_DEPARTMENT_OTHER): Payer: Medicaid Other | Admitting: Oncology

## 2015-03-26 ENCOUNTER — Ambulatory Visit (HOSPITAL_COMMUNITY): Payer: Medicaid Other | Admitting: Oncology

## 2015-03-26 ENCOUNTER — Ambulatory Visit (HOSPITAL_COMMUNITY)
Admission: RE | Admit: 2015-03-26 | Discharge: 2015-03-26 | Disposition: A | Discharge status: Home / Self Care | Payer: Medicare Other | Source: Ambulatory Visit | Attending: Oncology | Admitting: Oncology

## 2015-03-26 ENCOUNTER — Encounter (HOSPITAL_COMMUNITY): Payer: Self-pay | Admitting: Oncology

## 2015-03-26 VITALS — BP 142/107 | HR 101 | Temp 98.1°F | Resp 18 | Wt 218.2 lb

## 2015-03-26 DIAGNOSIS — D509 Iron deficiency anemia, unspecified: Secondary | ICD-10-CM

## 2015-03-26 DIAGNOSIS — M79601 Pain in right arm: Secondary | ICD-10-CM

## 2015-03-26 DIAGNOSIS — M545 Low back pain: Secondary | ICD-10-CM

## 2015-03-26 DIAGNOSIS — R609 Edema, unspecified: Secondary | ICD-10-CM

## 2015-03-26 DIAGNOSIS — M25421 Effusion, right elbow: Secondary | ICD-10-CM

## 2015-03-26 DIAGNOSIS — M542 Cervicalgia: Secondary | ICD-10-CM

## 2015-03-26 DIAGNOSIS — G8929 Other chronic pain: Secondary | ICD-10-CM

## 2015-03-26 NOTE — Progress Notes (Signed)
The patient is seen as a work-in today after she rescheduled her work-in appointment for the same issue last week.   She reports that she experienced right upper extremity swelling of right arm following IV iron infusion.  She reports multiple venipunctures in order to get IV access for iron infusion due to poor veinous access.  She received IV Feraheme 510 mg on 03/01/2015.    She saw Dr. Daneil Dolin at [facility] in the orthopedic department for spinal injections.  She reports that this intervention was cancelled due to her right arm swelling and she was referred to [facility] ED.  There, she underwent an [place] of right arm and that was negative for DVT.  Despite this, she was denied the orthopedic procedure due to her right arm swelling.    She is seen as a work-in today and she reports the story as mentioned above.  She notes that all orthopedic interventions are cancelled until we sort her swelling out.  She notes that she has undergone [place] before in the past and the one she had most recently to evaluate her right arm was unimpressive and very quick.  She is interested in having another performed to rule out a blood clot.   She notes that her arm was tender and more edematous than it is today.  She reports that she had multiple "knots" in locations of failed venipunctures.    As a side note, she discusses with me her desire to get off of her opiate pain medications.  I have deferred this to her prescribing physician.   BP 142/107 mmHg  Pulse 101  Temp(Src) 98.1 F (36.7 C) (Oral)  Resp 18  Wt 218 lb 3.2 oz (98.975 kg)  SpO2 98% General:  A and O x 3.  NAD. HEENT: normocephalic, atraumatic. Skin: Warm and dry. Upper extremities: On inspection, I cannot clearly identify edema, but the right upper extremity may be minimally edematous.  No obvious erythema.  No warmth to palpation, when compared to the left arm.  No ecchymosis noted.  Neuro: A and O x 3.  Assessment: 1. Right UE edema,  trace-minimal. 2. IDA, requiring IV iron intermittently. 3. Chronic back/neck pain, being evaluated and treated by Dr. Daneil Dolin.  Plan: 1. Right UE [place] to evaluate for DVT. 2. If negative, return as scheduled for follow-up. 3. If positive, further recommendations will follow. 4. Will update my dictation in an addendum as information is collected.  Will update Dr. Daneil Dolin via e-mail regarding findings and recommendations.  Patient and plan discussed with Dr. Ancil Linsey and she is in agreement with the aforementioned.   KEFALAS,THOMAS 03/26/2015 4:04 PM   Addendum: [name] [place] of right UE is negative for DVT.  Clinically, her right arm is similar in size to her left.  No erythema or heat.  In indication of cellulitis, DVT, etc.  She is free from an iron deficiency standpoint to undergo procedure by Dr. Rulon Sera at [facility].  EXAM: RIGHT UPPER EXTREMITY VENOUS DOPPLER ULTRASOUND  TECHNIQUE: Gray-scale sonography with graded compression, as well as color Doppler and duplex ultrasound were performed to evaluate the upper extremity deep venous system from the level of the subclavian vein and including the jugular, axillary, basilic, radial, ulnar and upper cephalic vein. Spectral Doppler was utilized to evaluate flow at rest and with distal augmentation maneuvers.  COMPARISON: Prior right upper extremity duplex venous ultrasound 04/02/2010  FINDINGS: Contralateral Subclavian Vein: Respiratory phasicity is normal and symmetric with the symptomatic  side. No evidence of thrombus. Normal compressibility.  Internal Jugular Vein: No evidence of thrombus. Normal compressibility, respiratory phasicity and response to augmentation.  Subclavian Vein: No evidence of thrombus. Normal compressibility, respiratory phasicity and response to augmentation.  Axillary Vein: No evidence of thrombus. Normal compressibility, respiratory phasicity and response to  augmentation.  Cephalic Vein: No evidence of thrombus. Normal compressibility, respiratory phasicity and response to augmentation.  Basilic Vein: No evidence of thrombus. Normal compressibility, respiratory phasicity and response to augmentation.  Brachial Veins: No evidence of thrombus. Normal compressibility, respiratory phasicity and response to augmentation.  Radial Veins: No evidence of thrombus. Normal compressibility, respiratory phasicity and response to augmentation.  Ulnar Veins: No evidence of thrombus. Normal compressibility, respiratory phasicity and response to augmentation.  Venous Reflux: None visualized.  Other Findings: None visualized.  IMPRESSION: No evidence of deep venous thrombosis.   Electronically Signed  By: Jacqulynn Cadet M.D.  On: 03/26/2015 17:58  KEFALAS,THOMAS 03/27/2015 7:42 AM

## 2015-03-26 NOTE — Progress Notes (Signed)
States "With my last iron infusion, they had a difficult time getting my IV started.  I was stuck several times in my right hand and anti-cubital area and finally they got it on the back of my lower arm.  Swelling and pain began before I left that day.  I went home elevated it and put ice and heat on it.  When I went to [facility] they refused to do anything (spinal injection) until someone calls and lets him know that my arm is ok.  They did an ultrasound at [facility] and it did not show anything, but not sure the technician did a good ultrasound."

## 2015-03-26 NOTE — Patient Instructions (Signed)
..  [place] at [facility] Discharge Instructions  RECOMMENDATIONS MADE BY THE CONSULTANT AND ANY TEST RESULTS WILL BE SENT TO YOUR REFERRING PHYSICIAN.  Ultra sound of your right arm today They will send you back up stairs if it is positive You can go home if negative  Return as scheduled  Thank you for choosing [place] at [facility] to provide your oncology and hematology care.  To afford each patient quality time with our provider, please arrive at least 15 minutes before your scheduled appointment time.    You need to re-schedule your appointment should you arrive 10 or more minutes late.  We strive to give you quality time with our providers, and arriving late affects you and other patients whose appointments are after yours.  Also, if you no show three or more times for appointments you may be dismissed from the clinic at the providers discretion.     Again, thank you for choosing [facility].  Our hope is that these requests will decrease the amount of time that you wait before being seen by our physicians.       _____________________________________________________________  Should you have questions after your visit to [facility], please contact our office at [phone] between the hours of 8:30 a.m. and 4:30 p.m.  Voicemails left after 4:30 p.m. will not be returned until the following business day.  For prescription refill requests, have your pharmacy contact our office.

## 2015-04-02 ENCOUNTER — Ambulatory Visit (INDEPENDENT_AMBULATORY_CARE_PROVIDER_SITE_OTHER): Payer: Medicare Other | Admitting: Family Medicine

## 2015-04-02 ENCOUNTER — Encounter: Payer: Self-pay | Admitting: Family Medicine

## 2015-04-02 VITALS — BP 118/76 | HR 100 | Resp 18 | Ht 64.0 in | Wt 220.1 lb

## 2015-04-02 DIAGNOSIS — E039 Hypothyroidism, unspecified: Secondary | ICD-10-CM | POA: Diagnosis not present

## 2015-04-02 DIAGNOSIS — F411 Generalized anxiety disorder: Secondary | ICD-10-CM

## 2015-04-02 DIAGNOSIS — D508 Other iron deficiency anemias: Secondary | ICD-10-CM

## 2015-04-02 DIAGNOSIS — R000 Tachycardia, unspecified: Secondary | ICD-10-CM

## 2015-04-02 DIAGNOSIS — E785 Hyperlipidemia, unspecified: Secondary | ICD-10-CM

## 2015-04-02 DIAGNOSIS — M545 Low back pain: Secondary | ICD-10-CM

## 2015-04-02 DIAGNOSIS — E8881 Metabolic syndrome: Secondary | ICD-10-CM

## 2015-04-02 DIAGNOSIS — R7302 Impaired glucose tolerance (oral): Secondary | ICD-10-CM

## 2015-04-02 DIAGNOSIS — E559 Vitamin D deficiency, unspecified: Secondary | ICD-10-CM

## 2015-04-02 DIAGNOSIS — R519 Headache, unspecified: Secondary | ICD-10-CM

## 2015-04-02 DIAGNOSIS — R51 Headache: Secondary | ICD-10-CM

## 2015-04-02 DIAGNOSIS — D509 Iron deficiency anemia, unspecified: Secondary | ICD-10-CM

## 2015-04-02 DIAGNOSIS — K219 Gastro-esophageal reflux disease without esophagitis: Secondary | ICD-10-CM

## 2015-04-02 MED ORDER — VITAMIN D (ERGOCALCIFEROL) 1.25 MG (50000 UNIT) PO CAPS: 50000.0000 [IU] | ORAL_CAPSULE | ORAL | Status: DC

## 2015-04-02 MED ORDER — CYCLOBENZAPRINE HCL 10 MG PO TABS: 10.0000 mg | ORAL_TABLET | Freq: Three times a day (TID) | ORAL | Status: DC | PRN

## 2015-04-02 MED ORDER — HYDROMORPHONE HCL 4 MG PO TABS: ORAL_TABLET | ORAL | Status: DC

## 2015-04-02 NOTE — Patient Instructions (Addendum)
Annual wellness end June, call if you need me before  Thankful that you are much better as far as intestine is concerned  I will give you a script for TEN dilaudid tablets to last until you see the Doc (Josie) at [facility] who will be treating your back pain. I will both give you and fax him a letter   Making him aware of this, and specifically requesting that he be responsible for pain management as far as your back is concerned  Non fast lipiod panel only when you next have blood work at cancer center  Fasting lipid, cmp and EGFR, HBa1c, TSH endJ7une BEFORE viisit approx 1 week  OK to try to fill flex[facility]il [facility] if more cost effective /affordable, you will get printed script  New is once weekly vit D   REDUCE cheese, egg yolks, red meat, triglycerides are slightly high  PLEASE BRING ALL MEDS TO NEXT VISIT  Thanks for choosing [facility], we consider it a privelige to serve you.   Note although script says take dilaudid three times daily, DO NOT DO THIS , take it as you have been taking it , which is 2 and a half tablets daily 

## 2015-04-05 ENCOUNTER — Other Ambulatory Visit: Payer: Self-pay | Admitting: Family Medicine

## 2015-04-05 ENCOUNTER — Telehealth: Payer: Self-pay | Admitting: Family Medicine

## 2015-04-05 DIAGNOSIS — M5440 Lumbago with sciatica, unspecified side: Secondary | ICD-10-CM

## 2015-04-05 MED ORDER — HYDROMORPHONE HCL 2 MG PO TABS: ORAL_TABLET | ORAL | Status: DC

## 2015-04-05 MED ORDER — HYDROMORPHONE HCL 4 MG PO TABS: ORAL_TABLET | ORAL | Status: DC

## 2015-04-05 NOTE — Telephone Encounter (Signed)
Pls shred the two scripts that I printed and signed earlier today.  Pls contact pt and let her know that I have made the decision , tp specifically ask Dr Dossie Der to continue to manage her pain  Until she can be managed by a pain clinic , since her pain management is more complex than I am able to handle..  ( review of her med history shows that she should have enough medication to last until I am able to get this sorted out with Dr Dossie Der, the surgeon who has been prescribing for her in the past.At her visit earlier this week she only came with a 4 mg bottle and specifically gave the impression that she would run out of medication in the very near future.  Now that I have reviewed her printed med history, her situation is different than what I originally thought.  History  shows that since 08/2014 she has been prescribed 360  hydromorphone 4 mg tabs, and 254 hydromorphone 2 mg tabs from. Dr Dossie Der  She reported at her recent visit taking two 4 mg tabs daily and "the equivalent of one 2 mg tablet daily, actually stated that she broke the 4 mg tablet in half, and did not either bring or disclose that she had 2 mg tabs at the visit.  She therefore has MORE than enough tablets to last until I am in touch with her primary prescriber of pain medication, Dr Dossie Der at [facility].   I am preparing a note to him, and will need to follow through on this  I will write a letter and request that it is faxed directly to his attention today.  I am asking that you speak directly to his nurse, if possible when you fax it so that he does recive the letter so I can get a response

## 2015-04-05 NOTE — Telephone Encounter (Signed)
Pls call and let pt know that I have the response from [facility] that her ortho Doc will not prescribe dilaudid.  I will prescribe at the same dose she has been taking which is in actuality 4 mg twice daily and 2 mg one daily until I get her to the soonest available pain clinic , will try both Dr Lyla Son in [place] and [place] make it clear, that pain mangement is her BEST option for her goal , which is not only adequate pain control, but just as important to her, relying on the LEAST amt ( lowest dose) of pain medication.  EXPLAIN that instead of prescribing so that she is "breaking the 4 mg tab in half " I will be prescribing teo different doses  She will be out of medication this weekend as I had hoped [place] would prescruibe  For her  She will need to sign a pain contract today, pls review it with her on my behalf, and she will receive her first 30 day supply until she is accepted into a pain clinic  [place] on 4/9, based on script I gave her on 04/02/2015  ?? pls ask

## 2015-04-05 NOTE — Telephone Encounter (Signed)
Will come in Monday to sign contract and to get the 4mg  tab only

## 2015-04-08 NOTE — Telephone Encounter (Signed)
I spoke with [place] office and nurse told me that they saw her on the 4th and did an injection and gave her enough Dilaudid to last until they saw her back on April 26th. I spoke with patient and she understands that Dr Moshe Cipro is leaving Dr Dossie Der to handle her pain management and they will be faxing over the notes from her next visit to [place]

## 2015-04-08 NOTE — Telephone Encounter (Signed)
Called patient and left message for them to return call at the office   

## 2015-04-11 ENCOUNTER — Encounter (HOSPITAL_COMMUNITY): Payer: Medicare Other | Attending: Hematology & Oncology

## 2015-04-11 DIAGNOSIS — D509 Iron deficiency anemia, unspecified: Secondary | ICD-10-CM | POA: Diagnosis present

## 2015-04-11 LAB — CBC WITH DIFFERENTIAL/PLATELET
Basophils Absolute: 0 10*3/uL (ref 0.0–0.1)
Basophils Relative: 0 % (ref 0–1)
EOS ABS: 0.1 10*3/uL (ref 0.0–0.7)
Eosinophils Relative: 1 % (ref 0–5)
HCT: 45.6 % (ref 36.0–46.0)
HEMOGLOBIN: 14.8 g/dL (ref 12.0–15.0)
LYMPHS ABS: 2.8 10*3/uL (ref 0.7–4.0)
Lymphocytes Relative: 33 % (ref 12–46)
MCH: 28.1 pg (ref 26.0–34.0)
MCHC: 32.5 g/dL (ref 30.0–36.0)
MCV: 86.5 fL (ref 78.0–100.0)
Monocytes Absolute: 0.6 10*3/uL (ref 0.1–1.0)
Monocytes Relative: 6 % (ref 3–12)
Neutro Abs: 5.1 10*3/uL (ref 1.7–7.7)
Neutrophils Relative %: 59 % (ref 43–77)
PLATELETS: 187 10*3/uL (ref 150–400)
RBC: 5.27 MIL/uL — ABNORMAL HIGH (ref 3.87–5.11)
RDW: 17 % — ABNORMAL HIGH (ref 11.5–15.5)
WBC: 8.6 10*3/uL (ref 4.0–10.5)

## 2015-04-11 LAB — IRON AND TIBC
Iron: 69 ug/dL (ref 42–145)
Saturation Ratios: 21 % (ref 20–55)
TIBC: 332 ug/dL (ref 250–470)
UIBC: 263 ug/dL (ref 125–400)

## 2015-04-11 LAB — FERRITIN: Ferritin: 41 ng/mL (ref 10–291)

## 2015-04-11 NOTE — Progress Notes (Signed)
Labs drawn

## 2015-04-12 ENCOUNTER — Telehealth: Payer: Self-pay

## 2015-04-14 NOTE — Telephone Encounter (Signed)
Psychiatrist Dr Harrington Challenger is the best able to diagnose as well as treat, pls explain the process to her  And enter referral for Dr Harrington Challenger to evaluate for possible ADHD and treat if she agrees, I will sign

## 2015-04-15 DIAGNOSIS — R52 Pain, unspecified: Secondary | ICD-10-CM | POA: Insufficient documentation

## 2015-04-16 NOTE — Telephone Encounter (Signed)
Pt in hospital right now and will call back when she would like referral done

## 2015-05-02 ENCOUNTER — Telehealth: Payer: Self-pay | Admitting: *Deleted

## 2015-05-02 NOTE — Telephone Encounter (Signed)
Pt called requesting to speak to a nurse. Please advise

## 2015-05-03 NOTE — Telephone Encounter (Signed)
Called patient and left message for them to return call at the office   

## 2015-05-05 DIAGNOSIS — E559 Vitamin D deficiency, unspecified: Secondary | ICD-10-CM

## 2015-05-05 HISTORY — DX: Vitamin D deficiency, unspecified: E55.9

## 2015-05-05 NOTE — Assessment & Plan Note (Signed)
Deteriorated. Patient re-educated about  the importance of commitment to a  minimum of 150 minutes of exercise per week.  The importance of healthy food choices with portion control discussed. Encouraged to start a food diary, count calories and to consider  joining a support group. Sample diet sheets offered. Goals set by the patient for the next several months.   Weight /BMI 04/02/2015 03/26/2015 02/28/2015  WEIGHT 220 lb 1.9 oz 218 lb 3.2 oz 216 lb 8 oz  HEIGHT 5\' 4"  - -  BMI 37.77 kg/m2 37.44 kg/m2 37.14 kg/m2  Some encounter information is confidential and restricted. Go to Review Flowsheets activity to see all data.    Current exercise per week 60 minutes.

## 2015-05-05 NOTE — Assessment & Plan Note (Signed)
Short term script for dialaudid written, pt to have ongoing  Script through ortho at [facility]

## 2015-05-05 NOTE — Assessment & Plan Note (Signed)
Controlled wit med prescribed when headaches occur, denies excessive headache frequency

## 2015-05-05 NOTE — Assessment & Plan Note (Signed)
Being treated with infusions by hematology

## 2015-05-05 NOTE — Assessment & Plan Note (Signed)
Controlled, no change in medication  

## 2015-05-05 NOTE — Assessment & Plan Note (Signed)
Controlled, but will attempt to reduce number of xanax tabs used daily, will discuss at next visit

## 2015-05-05 NOTE — Assessment & Plan Note (Signed)
The increased risk of cardiovascular disease associated with this diagnosis, and the need to consistently work on lifestyle to change this is discussed. Following  a  heart healthy diet ,commitment to 30 minutes of exercise at least 5 days per week, as well as control of blood sugar and cholesterol , and achieving a healthy weight are all the areas to be addressed .  

## 2015-05-05 NOTE — Progress Notes (Signed)
Subjective:    Patient ID: [name], female    DOB: 02/14/1961, 54 y.o.   MRN: [id number]  HPI Pt in for follow up and review of any recent lab data, last seen over 8 months ago. 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 She reports being hospitalized in both August and September at [facility] with primary diagnoses of abdominal and back pain She was hospitalized in mid October and again from Nov 13 to December 15, 2014 at [facility] She is grateful that her abdominal fistula is now fully healed, howevr now she continues to c/o back pain, and states she does not want to "become addicted" and wants to be off pain medication as soon as possible Script for a few days of dialudid is written at the visit, and a ;letter faxed to the back specialist requesting he take over her pain management from where the surgeon who has been treating her for years has left off, as her c/o pain is now in the back Review of Systems See HPI Denies recent fever or chills. Denies sinus pressure, nasal congestion, ear pain or sore throat. Denies chest congestion, productive cough or wheezing. Denies chest pains, palpitations and leg swelling Denies abdominal pain, nausea, vomiting,diarrhea or constipation.   Denies dysuria, frequency, hesitancy or incontinence.  Denies headaches, seizures, numbness, or tingling. Denies depression, uncontrolled  anxiety or insomnia.Is maintained on medication Denies skin break down or rash.        Objective:   Physical Exam BP 118/76 mmHg  Pulse 100  Resp 18  Ht 5\' 4"  (1.626 m)  Wt 220 lb 1.9 oz (99.846 kg)  BMI 37.77 kg/m2  SpO2 96%  Patient alert and oriented and in no  cardiopulmonary distress.  HEENT: No facial asymmetry, EOMI,   oropharynx pink and moist.  Neck supple no JVD, no mass.  Chest: Clear to auscultation bilaterally.  CVS: S1, S2 no murmurs, no S3.Regular rate.  ABD: Soft non tender.   Ext: No edema  MS: Adequate ROM spine, shoulders, hips and knees.  Skin: Intact, no ulcerations or rash noted.  Psych: Good eye contact, normal affect. Memory intact not anxious or depressed appearing.  CNS: CN 2-12 intact, power,  normal throughout.no focal deficits noted.        Assessment & Plan:  Hypothyroidism Controlled, no change in medication    Morbid obesity Deteriorated. Patient re-educated about  the importance of commitment to a  minimum of 150 minutes of exercise per week.  The importance of healthy food choices with portion control discussed. Encouraged to start a food diary, count calories and to consider  joining a support group. Sample diet sheets offered. Goals set by the patient for the next several months.   Weight /BMI 04/02/2015 03/26/2015 02/28/2015  WEIGHT 220 lb 1.9 oz 218 lb 3.2 oz 216 lb 8 oz  HEIGHT 5\' 4"  - -  BMI 37.77 kg/m2 37.44 kg/m2 37.14 kg/m2  Some encounter information is confidential and restricted. Go to Review Flowsheets activity to see all data.    Current exercise per week 60 minutes.    Metabolic syndrome X The increased risk of cardiovascular disease associated with this diagnosis, and the need  to consistently work on lifestyle to change this is discussed. Following  a  heart healthy diet ,commitment to 30 minutes of exercise at least 5 days per week, as well as control of blood sugar and cholesterol , and achieving a healthy weight are all the areas to be addressed .    Hyperlipidemia LDL goal <100 Uncontrolled Hyperlipidemia:Low fat diet discussed and encouraged.   Lipid Panel  Lab Results  Component Value Date   CHOL 129 02/25/2015   HDL 42* 02/25/2015   LDLCALC 51 02/25/2015    LDLDIRECT 138* 09/15/2012   TRIG 180* 02/25/2015   CHOLHDL 3.1 02/25/2015         Anxiety state Controlled, but will attempt to reduce number of xanax tabs used daily, will discuss at next visit   Backache Short term script for dialaudid written, pt to have ongoing  Script through ortho at [facility]    Headache disorder Controlled wit med prescribed when headaches occur, denies excessive headache frequency   Tachycardia Rate controlled on current med continue same   GERD (gastroesophageal reflux disease) Controlled, no change in medication    Iron deficiency anemia Being treated with infusions by hematology   Vitamin D deficiency Controlled, no change in medication

## 2015-05-05 NOTE — Assessment & Plan Note (Signed)
Rate controlled on current med continue same

## 2015-05-05 NOTE — Assessment & Plan Note (Signed)
Uncontrolled Hyperlipidemia:Low fat diet discussed and encouraged.   Lipid Panel  Lab Results  Component Value Date   CHOL 129 02/25/2015   HDL 42* 02/25/2015   LDLCALC 51 02/25/2015   LDLDIRECT 138* 09/15/2012   TRIG 180* 02/25/2015   CHOLHDL 3.1 02/25/2015

## 2015-05-06 ENCOUNTER — Other Ambulatory Visit: Payer: Self-pay | Admitting: Family Medicine

## 2015-05-07 ENCOUNTER — Telehealth: Payer: Self-pay

## 2015-05-07 ENCOUNTER — Telehealth: Payer: Self-pay | Admitting: *Deleted

## 2015-05-07 DIAGNOSIS — I471 Supraventricular tachycardia: Secondary | ICD-10-CM

## 2015-05-07 DIAGNOSIS — I4891 Unspecified atrial fibrillation: Secondary | ICD-10-CM

## 2015-05-07 MED ORDER — PANTOPRAZOLE SODIUM 40 MG PO TBEC: 40.0000 mg | DELAYED_RELEASE_TABLET | Freq: Two times a day (BID) | ORAL | Status: DC

## 2015-05-07 NOTE — Telephone Encounter (Signed)
Med sent to requested pharmacy 

## 2015-05-07 NOTE — Telephone Encounter (Signed)
Wants her nexium changed to protonix due to the warnings out about nexium and kidney damage. Wants a 90 day supply sent to her mail order

## 2015-05-07 NOTE — Telephone Encounter (Signed)
Wants to re establish with Dr Caryl Comes at [facility] in [place] and needs referral. Ok to enter?

## 2015-05-07 NOTE — Telephone Encounter (Signed)
pls change her as requested to protonix 40 mg one twice daily, I am not aware of her concerns voiced however, pls try to PA as pt preference

## 2015-05-07 NOTE — Telephone Encounter (Signed)
Pt called stating her doctor at [place] told her to come see Dr. Moshe Cipro for c-diff and a urine sample, pt wants to know if she can come this week Please advise

## 2015-05-07 NOTE — Telephone Encounter (Signed)
I have entered referral, I note in the past she was told he was not seeing new pts, the decision will be his, there are other Docs who see pts with a fib history in case she is scheduled to see  another Doc in the system, pls explain

## 2015-05-07 NOTE — Telephone Encounter (Signed)
Called patient and left message for them to return call at the office   

## 2015-05-07 NOTE — Telephone Encounter (Signed)
pls see note 

## 2015-05-07 NOTE — Addendum Note (Signed)
Addended by: Denman George B on: 05/07/2015 01:20 PM   Modules accepted: Orders

## 2015-05-08 NOTE — Telephone Encounter (Signed)
pls get name of Doc and send for note

## 2015-05-08 NOTE — Telephone Encounter (Signed)
Are you ok with ordering these tests?

## 2015-05-08 NOTE — Telephone Encounter (Signed)
States he is a friend of her husbands and he will see her

## 2015-05-09 ENCOUNTER — Other Ambulatory Visit: Payer: Self-pay

## 2015-05-09 ENCOUNTER — Telehealth: Payer: Self-pay | Admitting: Family Medicine

## 2015-05-09 MED ORDER — PANTOPRAZOLE SODIUM 40 MG PO TBEC: 40.0000 mg | DELAYED_RELEASE_TABLET | Freq: Two times a day (BID) | ORAL | Status: DC

## 2015-05-09 NOTE — Telephone Encounter (Signed)
Pls let pt know I am aware that she was recently hospitalized for c diff and UTI. Since no GI symptoms no need to "retest" esp with antibiotic resistance , potential harmful s/e and also she ahs a lot of allergies  OK to send urine c/s ONLY  Pls let her know I have reviewed her d./c summary and i see where xnanx is to be DISCONTINUED.  pls chesk wih her to see if she is stilltaking it , let me know her response if she is I will be advising a taper  And if she states she io is NOT I will ask that you call and send written note to pharmacy to discontinue

## 2015-05-09 NOTE — Telephone Encounter (Signed)
Sent request to Dr. Lucas Mallow for last ov note.  Patient aware.

## 2015-05-10 NOTE — Telephone Encounter (Signed)
Called and left message on cell # for patient to return call

## 2015-05-10 NOTE — Telephone Encounter (Signed)
Called and left message for patient to return call.  

## 2015-05-17 NOTE — Telephone Encounter (Signed)
Letter sent for patient to contact office

## 2015-05-23 ENCOUNTER — Telehealth: Payer: Self-pay | Admitting: Family Medicine

## 2015-05-23 ENCOUNTER — Encounter (HOSPITAL_COMMUNITY): Payer: Medicare Other

## 2015-05-23 ENCOUNTER — Encounter: Payer: Medicare Other | Admitting: Internal Medicine

## 2015-05-23 MED ORDER — PANTOPRAZOLE SODIUM 40 MG PO TBEC: 40.0000 mg | DELAYED_RELEASE_TABLET | Freq: Every day | ORAL | Status: DC

## 2015-05-23 NOTE — Telephone Encounter (Signed)
Resent the order with correct instructions

## 2015-05-24 ENCOUNTER — Ambulatory Visit (HOSPITAL_COMMUNITY): Payer: Medicaid Other | Admitting: Hematology & Oncology

## 2015-05-28 ENCOUNTER — Ambulatory Visit (HOSPITAL_COMMUNITY): Payer: Medicaid Other | Admitting: Hematology & Oncology

## 2015-05-28 ENCOUNTER — Telehealth: Payer: Self-pay | Admitting: Family Medicine

## 2015-05-28 ENCOUNTER — Ambulatory Visit (HOSPITAL_COMMUNITY)
Admission: RE | Admit: 2015-05-28 | Discharge: 2015-05-28 | Disposition: A | Discharge status: Home / Self Care | Payer: Medicare Other | Source: Ambulatory Visit | Attending: Family Medicine | Admitting: Family Medicine

## 2015-05-28 DIAGNOSIS — J209 Acute bronchitis, unspecified: Secondary | ICD-10-CM

## 2015-05-28 DIAGNOSIS — R05 Cough: Secondary | ICD-10-CM | POA: Insufficient documentation

## 2015-05-28 DIAGNOSIS — R509 Fever, unspecified: Secondary | ICD-10-CM | POA: Diagnosis not present

## 2015-05-28 MED ORDER — ALPRAZOLAM 1 MG PO TABS: 1.0000 mg | ORAL_TABLET | Freq: Three times a day (TID) | ORAL | Status: DC | PRN

## 2015-05-28 NOTE — Telephone Encounter (Addendum)
Noted , pls explain that at her visit her anxiety will need to be addressed as I am certain that medication management can and should be better  Pls explain I will redfuce the current xanax to three times daily since she will need to refill before she actually comes in since we have been unable to be in direct contact before  Please ask that she start the every 8 hours , 3 times daily dosing today  I am entering the new dose , call in and fax to pharmacy, for fill when due please

## 2015-05-28 NOTE — Telephone Encounter (Signed)
She will go have the chest xray today when her husband gets off work and can drive her.  As far as the xanax goes, she said she took a few of the klonopin and they didn't help her at all so she has the whole bottle left and only takes the xanax as prescribed at [facility].

## 2015-05-28 NOTE — Telephone Encounter (Signed)
Called on 5/30 /2016 with 3 day h/o head and chest congestion, fever and chills Z pack x 1 , prednisone 5 mg twice daily for 5 days , tessalon perles , 3 times daily as needed #30 and phenergan dm 5 cc at bedtime as needed x 120 cc  called in with no refills.  Pt advised to call nurse today for instructions stated she had been in [place], has MD appt this am at specialty , also said we could call on her cell  I have ordered a CXR pls loet her know, based on symptoms described  Pls explain  that I have been trying to contact her including sending a letter re discontinuing xanax , and had hoped to see her in the office 2 to  3 weeks ago. I am aware that 120 xanax tabs was dispensed at 4 times daily dosing as wellsa klonopin from [facility].  She needs to reduce the xanax she has left to twice daily dosing, this will absolutely last her until the 15th when she states she has an appt, the plan is to discontinue the xanax, as was recommended by Dr Dossie Der, need to further discouss atr next visit

## 2015-05-28 NOTE — Addendum Note (Signed)
Addended by: [name] E on: 05/28/2015 01:25 PM   Modules accepted: Orders

## 2015-05-28 NOTE — Telephone Encounter (Signed)
Called and spoke with patient and she is aware of change in dose of Xanax.  Will discuss in detail at next office visit on 6/15.  She would also like to discuss testing for CDiff that she was told to followup with PCP for.

## 2015-05-30 ENCOUNTER — Encounter (HOSPITAL_COMMUNITY): Payer: Medicare Other

## 2015-05-30 ENCOUNTER — Ambulatory Visit (HOSPITAL_COMMUNITY): Payer: Medicaid Other | Admitting: Hematology & Oncology

## 2015-06-03 ENCOUNTER — Encounter (HOSPITAL_COMMUNITY): Payer: Medicare Other | Attending: Hematology & Oncology

## 2015-06-03 ENCOUNTER — Other Ambulatory Visit: Payer: Self-pay

## 2015-06-03 DIAGNOSIS — D509 Iron deficiency anemia, unspecified: Secondary | ICD-10-CM | POA: Insufficient documentation

## 2015-06-03 DIAGNOSIS — Z1231 Encounter for screening mammogram for malignant neoplasm of breast: Secondary | ICD-10-CM

## 2015-06-03 LAB — CBC WITH DIFFERENTIAL/PLATELET
BASOS ABS: 0.1 10*3/uL (ref 0.0–0.1)
BASOS PCT: 1 % (ref 0–1)
EOS PCT: 2 % (ref 0–5)
Eosinophils Absolute: 0.1 10*3/uL (ref 0.0–0.7)
HCT: 42.6 % (ref 36.0–46.0)
HEMOGLOBIN: 14.1 g/dL (ref 12.0–15.0)
LYMPHS ABS: 3 10*3/uL (ref 0.7–4.0)
Lymphocytes Relative: 36 % (ref 12–46)
MCH: 29.3 pg (ref 26.0–34.0)
MCHC: 33.1 g/dL (ref 30.0–36.0)
MCV: 88.4 fL (ref 78.0–100.0)
Monocytes Absolute: 0.5 10*3/uL (ref 0.1–1.0)
Monocytes Relative: 6 % (ref 3–12)
Neutro Abs: 4.6 10*3/uL (ref 1.7–7.7)
Neutrophils Relative %: 55 % (ref 43–77)
Platelets: 282 10*3/uL (ref 150–400)
RBC: 4.82 MIL/uL (ref 3.87–5.11)
RDW: 15.1 % (ref 11.5–15.5)
WBC: 8.3 10*3/uL (ref 4.0–10.5)

## 2015-06-03 LAB — IRON AND TIBC
Iron: 114 ug/dL (ref 28–170)
Saturation Ratios: 25 % (ref 10.4–31.8)
TIBC: 463 ug/dL — ABNORMAL HIGH (ref 250–450)
UIBC: 349 ug/dL

## 2015-06-03 LAB — FERRITIN: FERRITIN: 20 ng/mL (ref 11–307)

## 2015-06-03 NOTE — Progress Notes (Signed)
LABS DRAWN

## 2015-06-04 ENCOUNTER — Ambulatory Visit (HOSPITAL_COMMUNITY): Payer: Medicare Other | Admitting: Hematology & Oncology

## 2015-06-04 ENCOUNTER — Other Ambulatory Visit: Payer: Self-pay | Admitting: Family Medicine

## 2015-06-04 ENCOUNTER — Telehealth: Payer: Self-pay

## 2015-06-04 DIAGNOSIS — Z9012 Acquired absence of left breast and nipple: Secondary | ICD-10-CM

## 2015-06-04 DIAGNOSIS — Z853 Personal history of malignant neoplasm of breast: Secondary | ICD-10-CM

## 2015-06-04 DIAGNOSIS — Z1231 Encounter for screening mammogram for malignant neoplasm of breast: Secondary | ICD-10-CM

## 2015-06-04 NOTE — Telephone Encounter (Signed)
Pt coming in to get ccua

## 2015-06-04 NOTE — Telephone Encounter (Signed)
oK for CCUA in office  if abn send 3 day course of cipro per routine and also c/s pls

## 2015-06-04 NOTE — Telephone Encounter (Signed)
States she thinks she has a uti. Dysuria started today and she said her infections can get bad quick and that you told her she could come by here for a urine test whenever she has those symptoms. Please advise

## 2015-06-06 NOTE — Progress Notes (Signed)
This encounter was created in error - please disregard.

## 2015-06-10 ENCOUNTER — Encounter: Payer: Self-pay | Admitting: Family Medicine

## 2015-06-10 ENCOUNTER — Other Ambulatory Visit: Payer: Self-pay | Admitting: Family Medicine

## 2015-06-10 ENCOUNTER — Ambulatory Visit (INDEPENDENT_AMBULATORY_CARE_PROVIDER_SITE_OTHER): Payer: Medicare Other | Admitting: Family Medicine

## 2015-06-10 VITALS — BP 118/74 | HR 96 | Resp 16 | Ht 64.0 in | Wt 228.0 lb

## 2015-06-10 DIAGNOSIS — M549 Dorsalgia, unspecified: Secondary | ICD-10-CM

## 2015-06-10 DIAGNOSIS — F411 Generalized anxiety disorder: Secondary | ICD-10-CM

## 2015-06-10 DIAGNOSIS — R300 Dysuria: Secondary | ICD-10-CM | POA: Diagnosis not present

## 2015-06-10 DIAGNOSIS — Z0000 Encounter for general adult medical examination without abnormal findings: Secondary | ICD-10-CM

## 2015-06-10 LAB — POCT URINALYSIS DIPSTICK
Bilirubin, UA: NEGATIVE
Blood, UA: NEGATIVE
Glucose, UA: NEGATIVE
KETONES UA: NEGATIVE
Leukocytes, UA: NEGATIVE
Nitrite, UA: NEGATIVE
PH UA: 6.5
PROTEIN UA: NEGATIVE
SPEC GRAV UA: 1.01
Urobilinogen, UA: 0.2

## 2015-06-10 MED ORDER — HYDROMORPHONE HCL 4 MG PO TABS: ORAL_TABLET | ORAL | Status: DC

## 2015-06-10 MED ORDER — METHYLPREDNISOLONE ACETATE 80 MG/ML IJ SUSP
80.0000 mg | Freq: Once | INTRAMUSCULAR | Status: AC
  Administered 2015-06-10: 80 mg via INTRAMUSCULAR

## 2015-06-10 MED ORDER — KETOROLAC TROMETHAMINE 60 MG/2ML IM SOLN
60.0000 mg | Freq: Once | INTRAMUSCULAR | Status: AC
  Administered 2015-06-10: 60 mg via INTRAMUSCULAR

## 2015-06-10 MED ORDER — ALPRAZOLAM ER 3 MG PO TB24: ORAL_TABLET | ORAL | Status: DC

## 2015-06-10 NOTE — Progress Notes (Signed)
Subjective:    Patient ID: [name], female    DOB: [birth year], 54 y.o.   MRN: [id number]  HPI Preventive Screening-Counseling & Management   Patient present here today for a Medicare annual wellness visit. Has c/o chronic uncontrolled LBP which she "wants fixed"  Needs pain med prescribed , short term by me, until she is fully  established with a new pain specialist at [name]. I have communication both from her surgeon , Dr Dossie Der, who will no longer prescribe her pain medication, and her new Pain specialist who she saw this past Friday 3 days ago, and who , per protocol , did not write a script for medication at firs visit, but ordered baseline testing with a 3 to 4 week noted in his note Pt states of the 4 mg tabs she uses between 3 to 6 per day , depending on her pain level, of note she had been receiving 150 four mg dilaudid tabs as well as s 2 mg tablets in th past, my compromise is 150 tablets 4 mg strength to last 30 days, and will directly convey this to the [facility] team, pain management will be through pain clinic at [facility]. I also discussed her anxiety n management,  Goal is to wean off xanax to buspar , now changed to long acting xanax at lower dose than she had been taking Also c/o new onset burning with urination, she does have IC, denied fever, chills or flank pain     Current Problems (verified)   Medications Prior to Visit Allergies (verified)   PAST HISTORY  Family History (updated)   Social History Disabled former Statistician,  Married with one daughter and 2 step sons    Risk Factors  Current exercise habits:  Limited due to back pain   Dietary issues discussed: Low carb diet    Cardiac risk factors:  HTN, IGT, metabolic syndrome  Depression Screen  (Note: if answer to either of the following is "Yes", a more complete depression screening is indicated)   Over the past two weeks, have you felt down, depressed or hopeless? Yes, due to health concerns Over  the past two weeks, have you felt little interest or pleasure in doing things? Yes Have you lost interest or pleasure in daily life? Yes due to pain but denies being depressed Do you often feel hopeless? At times due to health Do you cry easily over simple problems? No   Activities of Daily Living  In your present state of health, do you have any difficulty performing the following activities?  Driving?: No Managing money?: No Feeding yourself?:No Getting from bed to chair?:yes at times Climbing a flight of stairs?: Yes due to back pain Preparing food and eating?:No Bathing or showering?:No Getting dressed?:No Getting to the toilet?:No Using the toilet?:No Moving around from place to place?: Yes uncontrolled back pain radiating down right lower extremity  Fall Risk Assessment In the past year have you fallen or had a near fall?: Yes Are you currently taking any medications that make you dizzy?:No   Hearing Difficulties: No Do you often ask people to speak up or repeat themselves?:No Do you experience ringing or noises in your ears?:No Do you have difficulty understanding soft or whispered voices?:No  Cognitive Testing  Alert? Yes Normal Appearance?Yes  Oriented to person? Yes Place? Yes  Time? Yes  Displays appropriate judgment?Yes  Can read the correct time from a watch face? yes Are you having problems remembering things?No  Advanced Directives  have been discussed with the patient?Yes and currently has documents in place, she does have a living will,and  her daughter Nolon Nations  is her POA    List the Names of Other Physician/Practitioners you currently use: careteams updated    Indicate any recent Medical Services you may have received from other than [facility] providers in the past year (date may be approximate).   Assessment:    Annual Wellness Exam   Plan:    Medicare Attestation  I have personally reviewed:  [name] medical and social history  Their  use of alcohol, tobacco or illicit drugs  Their current medications and supplements  [name] functional ability including ADLs,fall risks, home safety risks, cognitive, and hearing and visual impairment  Diet and physical activities  Evidence for depression or mood disorders  [name] weight, height, BMI, and visual acuity have been recorded in the chart. I have made referrals, counseling, and provided education to the patient based on review of the above and I have provided the patient with a written personalized care plan for preventive services.      Review of Systems     Objective:   Physical Exam BP 118/74 mmHg  Pulse 96  Resp 16  Ht 5\' 4"  (1.626 m)  Wt 228 lb (103.42 kg)  BMI 39.12 kg/m2  SpO2 96% Patient alert and oriented and in no cardiopulmonary distress.Anxious and somewhat frustrated by pain   issues of her healthcare, but very happy to show a closed fistula  Wants the cause of her pain to be dealt with and wants to "be off all medication   MS: decreased ROM lumbar spine and  Right lower ext due to pain  Skin: Intact, no ulcerations or rash noted.  Psych: Good eye contact, normal affect. Memory intact mildly  Anxious,m at times angry/ frustrated as she discussed her chronic pain issue , mildly  depressed appearing.        Assessment & Plan:  Back pain with radiation Uncontrolled will write 4 week supply of dilaudid until she sees her new pain specialist again, states on bad days she takes 8 mg ( two 4mg  tabs) three times daily of dilaudid , as of today she has no pain mediication.Generally takes on 3 times daily, also was being prescribed 2 mg tabs as well I will not prescribe the 2 mg tablets and she understands A pain contract is not established here Information and a copy of script provided will be sent to her Docs at [facility], [place] and her new  pain Doc who both sent me her most updated  records  Toradol and depo medrol adminisitered at  visit  Medicare annual wellness visit, subsequent Annual exam as documented. Counseling done  re healthy lifestyle involving commitment to 150 minutes exercise per week, heart healthy diet, and attaining healthy weight.The importance of adequate sleep also discussed. Regular seat belt use and home safety, is also discussed. Immunization and cancer screening needs are specifically addressed at this visit.   Anxiety state Patient has now reduced down for four xanax tabs daily to three I discussed openly how addictive xanax is and the fact that I would hope to be able to eventually transition her to buspar if that were possible. In the interim , she is now to start the same total daily dose 3 mg in and extended release tablet Follow up is in 3 month, sooner if needed Needs full depression screen at next visit

## 2015-06-10 NOTE — Assessment & Plan Note (Addendum)
Uncontrolled will write 4 week supply of dilaudid until she sees her new pain specialist again, states on bad days she takes 8 mg ( two 4mg  tabs) three times daily of dilaudid , as of today she has no pain mediication.Generally takes on 3 times daily, also was being prescribed 2 mg tabs as well I will not prescribe the 2 mg tablets and she understands A pain contract is not established here Information and a copy of script provided will be sent to her Docs at [facility], [place] and her new  pain Doc who both sent me her most updated  records  Toradol and depo medrol adminisitered at visit

## 2015-06-10 NOTE — Patient Instructions (Addendum)
F/u in 3 month, call if you need me before  One month only written for pain medication, no contract here, will be in touch with both Dr Dossie Der and Dr Jeanell Sparrow with this information  Change to slow release once daily xanax when next filled  All the best and I am thankful your abdomen is good  Labs this week please   Injections today for pain

## 2015-06-11 DIAGNOSIS — R300 Dysuria: Secondary | ICD-10-CM | POA: Insufficient documentation

## 2015-06-11 DIAGNOSIS — Z09 Encounter for follow-up examination after completed treatment for conditions other than malignant neoplasm: Secondary | ICD-10-CM | POA: Insufficient documentation

## 2015-06-11 NOTE — Assessment & Plan Note (Addendum)
Patient has now reduced down for four xanax tabs daily to three I discussed openly how addictive xanax is and the fact that I would hope to be able to eventually transition her to buspar if that were possible. In the interim , she is now to start the same total daily dose 3 mg in and extended release tablet Follow up is in 3 month, sooner if needed Needs full depression screen at next visit

## 2015-06-11 NOTE — Assessment & Plan Note (Signed)
Annual exam as documented. Counseling done  re healthy lifestyle involving commitment to 150 minutes exercise per week, heart healthy diet, and attaining healthy weight.The importance of adequate sleep also discussed. Regular seat belt use and home safety, is also discussed.  Immunization and cancer screening needs are specifically addressed at this visit.  

## 2015-06-11 NOTE — Assessment & Plan Note (Signed)
Normal urinalysis.

## 2015-06-12 ENCOUNTER — Encounter: Payer: Medicare Other | Admitting: Family Medicine

## 2015-06-20 ENCOUNTER — Encounter (HOSPITAL_BASED_OUTPATIENT_CLINIC_OR_DEPARTMENT_OTHER): Payer: Medicare Other | Admitting: Hematology & Oncology

## 2015-06-20 ENCOUNTER — Encounter (HOSPITAL_COMMUNITY): Payer: Self-pay | Admitting: Hematology & Oncology

## 2015-06-20 VITALS — BP 105/73 | HR 92 | Temp 98.1°F | Resp 22 | Wt 224.1 lb

## 2015-06-20 DIAGNOSIS — Z86718 Personal history of other venous thrombosis and embolism: Secondary | ICD-10-CM

## 2015-06-20 DIAGNOSIS — Z86711 Personal history of pulmonary embolism: Secondary | ICD-10-CM | POA: Diagnosis not present

## 2015-06-20 DIAGNOSIS — Z853 Personal history of malignant neoplasm of breast: Secondary | ICD-10-CM | POA: Diagnosis not present

## 2015-06-20 DIAGNOSIS — D509 Iron deficiency anemia, unspecified: Secondary | ICD-10-CM | POA: Diagnosis not present

## 2015-06-20 DIAGNOSIS — R790 Abnormal level of blood mineral: Secondary | ICD-10-CM

## 2015-06-20 MED ORDER — SODIUM CHLORIDE 0.9 % IV SOLN: 510.0000 mg | Freq: Once | INTRAVENOUS | Status: DC

## 2015-06-20 NOTE — Progress Notes (Signed)
[name], MD [address] [place] [address]  Iron deficiency anemia - Plan: vitamin C (ASCORBIC ACID) 500 MG tablet, DULoxetine (CYMBALTA) 30 MG capsule, Evening Primrose Oil 500 MG CAPS, fenofibrate (TRICOR) 145 MG tablet, magnesium oxide (MAG-OX) 400 MG tablet, conjugated estrogens (PREMARIN) vaginal cream, HYDROmorphone (DILAUDID) 2 MG tablet, CBC with Differential, Comprehensive metabolic panel, Ferritin, Iron and TIBC, DISCONTINUED: Vitamin D, Ergocalciferol, (DRISDOL) 50000 UNITS CAPS capsule, DISCONTINUED: ondansetron (ZOFRAN) 4 MG tablet, DISCONTINUED: DULoxetine (CYMBALTA) 30 MG capsule  Low ferritin level - Plan: vitamin C (ASCORBIC ACID) 500 MG tablet, DULoxetine (CYMBALTA) 30 MG capsule, Evening Primrose Oil 500 MG CAPS, fenofibrate (TRICOR) 145 MG tablet, magnesium oxide (MAG-OX) 400 MG tablet, conjugated estrogens (PREMARIN) vaginal cream, HYDROmorphone (DILAUDID) 2 MG tablet, CBC with Differential, Comprehensive metabolic panel, Ferritin, Iron and TIBC, DISCONTINUED: Vitamin D, Ergocalciferol, (DRISDOL) 50000 UNITS CAPS capsule, DISCONTINUED: ondansetron (ZOFRAN) 4 MG tablet, DISCONTINUED: DULoxetine (CYMBALTA) 30 MG capsule  CURRENT THERAPY: Observation  INTERVAL HISTORY: [name] 54 y.o. female returns for followup of iron deficiency anemia. AND history of Paget's disease of the left breast status post resection with breast reconstruction with a left sided TRAM flap. This was done at [facility] with her initial diagnosis of Paget's disease in 2006. AND  Medical noncompliance with multiple missed appointments and lab appointments since June 2015: 08/06/2014, 08/07/2014, 08/14/2014, 08/17/2014. However, patient has frequent hospital admissions at [facility].  She has a history of blood clots, with PE and has an IVC filter in place, not on anticoagulation.  She is present alone today. She complains of having no energy, severe hair loss, and  mouth ulcers.  She is not feeling well today at all. She has had 4 back injections;  she has had increased back pain, she says that it eases when she sits down.  She states that her right arm stays swollen from trauma She takes her Vitamins twice a day; magnesium and potassium.  Says that she has trouble with Vitamin D, however. She had breast cancer 2006; Expander and Tram Flap She has had many intestinal surgeries; parts of both small and large have been removed. She notes she has ongoing "drainage" from her abdominal wall. She follows at [place] with Dr. Dossie Der.  [place] appointment, 7/6, Dr. Moshe Cipro, [facility], [facility] She thinks that she has an implant with a slow leak; her implants are saline but she has has silcone before.   Past Medical History  Diagnosis Date  . SVT (supraventricular tachycardia)   . Hypercholesterolemia   . Bilateral ovarian cysts   . Perforation bowel   . PE (pulmonary embolism)   . Thyroid disease   . GERD (gastroesophageal reflux disease)   . Sepsis(995.91)   . E coli infection   . Anxiety   . Pneumonia   . Depression   . Carpal tunnel syndrome   . DVT (deep venous thrombosis)   . Anemia   . Irregular heartbeat   . Stroke   . DVT (deep venous thrombosis)   . Blood transfusion without reported diagnosis   . Clotting disorder   . Fibromyalgia   . Hx of migraines   . Interstitial cystitis   . Acute renal failure     renal faliure with surgery   . Cancer     left breast   . Medically noncompliant 02/28/2015  . H/O bilateral salpingo-oophorectomy 02/28/2015    has Paget's disease of female breast; Hypothyroidism; Hyperlipidemia  LDL goal <100; Obesity; Iron deficiency anemia; Anxiety state; CARPAL TUNNEL SYNDROME, RIGHT; OVARIAN CYST; Backache; FIBROMYALGIA; Tachycardia; SMALL BOWEL OBSTRUCTION, HX OF; INTERSTITIAL CYSTITIS; Metabolic syndrome X; Postsurgical menopause; Insomnia; Fatigue; Headache disorder; Morbid obesity; GERD  (gastroesophageal reflux disease); Seasonal allergies; Irregular heart rate; Low ferritin level; Nausea alone; Back pain with radiation; Vitamin D deficiency; Medicare annual wellness visit, subsequent; and Dysuria on her problem list.     is allergic to bisacodyl; clarithromycin; clarithromycin; clindamycin hcl; codeine; monosodium glutamate; nitrofurantoin; scopolamine hbr; sulfonamide derivatives; and tape.  Ms. Statzer had no medications administered during this visit.  Past Surgical History  Procedure Laterality Date  . Heart ablation    . Vena cava filter placement    . Abdominal hysterectomy    . Hernia repair    . Bowel resection    . Carpal tunnel release    . Bowel fistula    . Breast enhancement surgery  1983  . Removal of breast implants  1993  . Cesarean section  1986  . Colon surgery    . Cosmetic surgery    . Enterocutaneous fistula closure N/A 08/09/2013    [facility], Dr Dossie Der  . Mastectomy    . Breast surgery    . Appendectomy    . Cervical spine surgery    . Fracture surgery      arm right (as a child)     Denies any headaches, dizziness, double vision, fevers, chills, night sweats, nausea, vomiting, diarrhea, constipation, chest pain, heart palpitations, shortness of breath, blood in stool, black tarry stool, urinary pain, urinary burning, urinary frequency, hematuria. 14 point review of systems was performed and is negative except as detailed under history of present illness and above   PHYSICAL EXAMINATION  ECOG PERFORMANCE STATUS: 1 - Symptomatic but completely ambulatory  Filed Vitals:   06/20/15 1027  BP: 105/73  Pulse: 92  Temp: 98.1 F (36.7 C)  Resp: 22    GENERAL:alert, no distress, well nourished, well developed, comfortable, cooperative and obese SKIN: skin color, texture, turgor are normal, no rashes or significant lesions HEAD: Normocephalic, No masses, lesions, tenderness or abnormalities EYES: normal, PERRLA, EOMI, Conjunctiva are pink  and non-injected EARS: External ears normal OROPHARYNX:mucous membranes are moist  NECK: supple, no adenopathy, thyroid normal size, non-tender, without nodularity, no stridor, non-tender, trachea midline LYMPH:  no palpable lymphadenopathy BREAST:not examined LUNGS: clear to auscultation and percussion HEART: regular rate & rhythm, no murmurs, no gallops, S1 normal and S2 normal ABDOMEN:obese, multiple incision sites. One small area with mild drainage. Clear serosanguinous BACK: Back symmetric, no curvature. EXTREMITIES:less then 2 second capillary refill, no joint deformities, effusion, or inflammation, no skin discoloration, no cyanosis  NEURO: alert & oriented x 3 with fluent speech, no focal motor/sensory deficits, gait normal    LABORATORY DATA: I have reviewed the laboratory studies listed below:  Results for JOHANNY, SEGERS (MRN [id number]) as of 07/13/2015 08:47  Ref. Range 06/03/2015 12:22 06/03/2015 12:23  Iron Latest Ref Range: 28-170 ug/dL  114  UIBC Latest Units: ug/dL  349  TIBC Latest Ref Range: 250-450 ug/dL  463 (H)  Saturation Ratios Latest Ref Range: 10.4-31.8 %  25  Ferritin Latest Ref Range: 11-307 ng/mL  20  WBC Latest Ref Range: 4.0-10.5 K/uL 8.3   RBC Latest Ref Range: 3.87-5.11 MIL/uL 4.82   Hemoglobin Latest Ref Range: 12.0-15.0 g/dL 14.1   HCT Latest Ref Range: 36.0-46.0 % 42.6   MCV Latest Ref Range: 78.0-100.0 fL 88.4   MCH Latest  Ref Range: 26.0-34.0 pg 29.3   MCHC Latest Ref Range: 30.0-36.0 g/dL 33.1   RDW Latest Ref Range: 11.5-15.5 % 15.1   Platelets Latest Ref Range: 150-400 K/uL 282   Neutrophils Latest Ref Range: 43-77 % 55   Lymphocytes Latest Ref Range: 12-46 % 36   Monocytes Relative Latest Ref Range: 3-12 % 6   Eosinophil Latest Ref Range: 0-5 % 2   Basophil Latest Ref Range: 0-1 % 1   NEUT# Latest Ref Range: 1.7-7.7 K/uL 4.6   Lymphocyte # Latest Ref Range: 0.7-4.0 K/uL 3.0   Monocyte # Latest Ref Range: 0.1-1.0 K/uL 0.5   Eosinophils  Absolute Latest Ref Range: 0.0-0.7 K/uL 0.1   Basophils Absolute Latest Ref Range: 0.0-0.1 K/uL 0.1    Results for CAMELLIA, POPESCU (MRN [id number]) as of 07/13/2015 08:47  Ref. Range 06/03/2015 12:23  Ferritin Latest Ref Range: 11-307 ng/mL 20    ASSESSMENT AND PLAN:  Iron deficiency from iron malabsorption *Paget's disease of the left breast patient receives mammograms in [place] History of DVT, PE and IVC filter placement not on anticoagulation Followed at [facility] for an ECF/ventral hernia  I advised the patient that based upon her laboratory studies she will need additional iron replacement. I have calculated her total iron deficit and we will arrange for IV iron replacement. She has poor malabsorption of iron secondary to complicated GI surgical history. She currently follows at [facility].  She has a history of Paget's disease of the left breast and obtains mammograms in [place].  We will arrange for ongoing follow-up of her iron deficiency in 4 months with repeat laboratory studies and an office visit. I advised her if she has symptoms of iron deficiency prior to follow-up to notify us and we can check her iron levels and CBC at any time.  All questions were answered. The patient knows to call the clinic with any problems, questions or concerns. We can certainly see the patient much sooner if necessary.  This note was electronically signed.  This document serves as a record of services personally performed by Ancil Linsey, MD. It was created on her behalf by Janace Hoard, a trained medical scribe. The creation of this record is based on the scribe's personal observations and the provider's statements to them. This document has been checked and approved by the attending provider.  I have reviewed the above documentation for accuracy and completeness, and I agree with the above.  Kelby Fam. Whitney Muse, MD

## 2015-06-20 NOTE — Patient Instructions (Signed)
[place] at [facility] Discharge Instructions  RECOMMENDATIONS MADE BY THE CONSULTANT AND ANY TEST RESULTS WILL BE SENT TO YOUR REFERRING PHYSICIAN.  Exam completed by Dr Whitney Muse today We will scheduled you for one dosse of Faraheme. Return in 4 months to see the doctor Lab work every 6 weeks Please call the clinic if you have any questions or concerns  Thank you for choosing [employer] at [facility] to provide your oncology and hematology care.  To afford each patient quality time with our provider, please arrive at least 15 minutes before your scheduled appointment time.    You need to re-schedule your appointment should you arrive 10 or more minutes late.  We strive to give you quality time with our providers, and arriving late affects you and other patients whose appointments are after yours.  Also, if you no show three or more times for appointments you may be dismissed from the clinic at the providers discretion.     Again, thank you for choosing [facility].  Our hope is that these requests will decrease the amount of time that you wait before being seen by our physicians.       _____________________________________________________________  Should you have questions after your visit to [facility], please contact our office at [phone] between the hours of 8:30 a.m. and 4:30 p.m.  Voicemails left after 4:30 p.m. will not be returned until the following business day.  For prescription refill requests, have your pharmacy contact our office.

## 2015-06-26 ENCOUNTER — Encounter: Payer: Self-pay | Admitting: Family Medicine

## 2015-07-03 ENCOUNTER — Emergency Department (HOSPITAL_COMMUNITY)
Admission: EM | Admit: 2015-07-03 | Discharge: 2015-07-03 | Disposition: A | Discharge status: Home / Self Care | Payer: Medicare Other | Attending: Emergency Medicine | Admitting: Emergency Medicine

## 2015-07-03 ENCOUNTER — Encounter (HOSPITAL_COMMUNITY): Payer: Self-pay | Admitting: Emergency Medicine

## 2015-07-03 ENCOUNTER — Emergency Department (HOSPITAL_COMMUNITY): Payer: Medicare Other

## 2015-07-03 ENCOUNTER — Ambulatory Visit (HOSPITAL_COMMUNITY): Payer: Medicare Other

## 2015-07-03 ENCOUNTER — Ambulatory Visit: Payer: Medicare Other

## 2015-07-03 ENCOUNTER — Other Ambulatory Visit: Payer: Self-pay

## 2015-07-03 DIAGNOSIS — Z90722 Acquired absence of ovaries, bilateral: Secondary | ICD-10-CM | POA: Diagnosis not present

## 2015-07-03 DIAGNOSIS — Z86711 Personal history of pulmonary embolism: Secondary | ICD-10-CM | POA: Diagnosis not present

## 2015-07-03 DIAGNOSIS — G8918 Other acute postprocedural pain: Secondary | ICD-10-CM

## 2015-07-03 DIAGNOSIS — F419 Anxiety disorder, unspecified: Secondary | ICD-10-CM | POA: Diagnosis not present

## 2015-07-03 DIAGNOSIS — Z8669 Personal history of other diseases of the nervous system and sense organs: Secondary | ICD-10-CM | POA: Insufficient documentation

## 2015-07-03 DIAGNOSIS — Z853 Personal history of malignant neoplasm of breast: Secondary | ICD-10-CM | POA: Insufficient documentation

## 2015-07-03 DIAGNOSIS — Z8701 Personal history of pneumonia (recurrent): Secondary | ICD-10-CM | POA: Insufficient documentation

## 2015-07-03 DIAGNOSIS — L03311 Cellulitis of abdominal wall: Secondary | ICD-10-CM

## 2015-07-03 DIAGNOSIS — Z862 Personal history of diseases of the blood and blood-forming organs and certain disorders involving the immune mechanism: Secondary | ICD-10-CM | POA: Insufficient documentation

## 2015-07-03 DIAGNOSIS — Z79899 Other long term (current) drug therapy: Secondary | ICD-10-CM | POA: Insufficient documentation

## 2015-07-03 DIAGNOSIS — Z8742 Personal history of other diseases of the female genital tract: Secondary | ICD-10-CM | POA: Insufficient documentation

## 2015-07-03 DIAGNOSIS — Z9119 Patient's noncompliance with other medical treatment and regimen: Secondary | ICD-10-CM | POA: Diagnosis not present

## 2015-07-03 DIAGNOSIS — K219 Gastro-esophageal reflux disease without esophagitis: Secondary | ICD-10-CM | POA: Diagnosis not present

## 2015-07-03 DIAGNOSIS — Z8619 Personal history of other infectious and parasitic diseases: Secondary | ICD-10-CM | POA: Diagnosis not present

## 2015-07-03 DIAGNOSIS — Z8679 Personal history of other diseases of the circulatory system: Secondary | ICD-10-CM | POA: Insufficient documentation

## 2015-07-03 DIAGNOSIS — E079 Disorder of thyroid, unspecified: Secondary | ICD-10-CM | POA: Diagnosis not present

## 2015-07-03 DIAGNOSIS — Z8673 Personal history of transient ischemic attack (TIA), and cerebral infarction without residual deficits: Secondary | ICD-10-CM | POA: Diagnosis not present

## 2015-07-03 DIAGNOSIS — E785 Hyperlipidemia, unspecified: Secondary | ICD-10-CM | POA: Diagnosis not present

## 2015-07-03 DIAGNOSIS — Z4801 Encounter for change or removal of surgical wound dressing: Secondary | ICD-10-CM | POA: Diagnosis present

## 2015-07-03 DIAGNOSIS — Z86718 Personal history of other venous thrombosis and embolism: Secondary | ICD-10-CM | POA: Diagnosis not present

## 2015-07-03 DIAGNOSIS — G43909 Migraine, unspecified, not intractable, without status migrainosus: Secondary | ICD-10-CM | POA: Insufficient documentation

## 2015-07-03 DIAGNOSIS — F329 Major depressive disorder, single episode, unspecified: Secondary | ICD-10-CM | POA: Diagnosis not present

## 2015-07-03 LAB — CBC WITH DIFFERENTIAL/PLATELET
BASOS PCT: 0 % (ref 0–1)
Basophils Absolute: 0 10*3/uL (ref 0.0–0.1)
EOS ABS: 0.1 10*3/uL (ref 0.0–0.7)
Eosinophils Relative: 1 % (ref 0–5)
HEMATOCRIT: 39.8 % (ref 36.0–46.0)
Hemoglobin: 13.2 g/dL (ref 12.0–15.0)
Lymphocytes Relative: 32 % (ref 12–46)
Lymphs Abs: 2.2 10*3/uL (ref 0.7–4.0)
MCH: 29.3 pg (ref 26.0–34.0)
MCHC: 33.2 g/dL (ref 30.0–36.0)
MCV: 88.4 fL (ref 78.0–100.0)
MONOS PCT: 7 % (ref 3–12)
Monocytes Absolute: 0.5 10*3/uL (ref 0.1–1.0)
NEUTROS ABS: 4.1 10*3/uL (ref 1.7–7.7)
NEUTROS PCT: 60 % (ref 43–77)
PLATELETS: 168 10*3/uL (ref 150–400)
RBC: 4.5 MIL/uL (ref 3.87–5.11)
RDW: 14.2 % (ref 11.5–15.5)
WBC: 6.9 10*3/uL (ref 4.0–10.5)

## 2015-07-03 LAB — COMPREHENSIVE METABOLIC PANEL
ALBUMIN: 4.2 g/dL (ref 3.5–5.0)
ALT: 22 U/L (ref 14–54)
AST: 25 U/L (ref 15–41)
Alkaline Phosphatase: 63 U/L (ref 38–126)
Anion gap: 9 (ref 5–15)
BUN: 25 mg/dL — AB (ref 6–20)
CHLORIDE: 105 mmol/L (ref 101–111)
CO2: 25 mmol/L (ref 22–32)
Calcium: 9.1 mg/dL (ref 8.9–10.3)
Creatinine, Ser: 1.1 mg/dL — ABNORMAL HIGH (ref 0.44–1.00)
GFR calc Af Amer: >60 mL/min (ref >60)
GFR calc non Af Amer: 56 mL/min — ABNORMAL LOW (ref >60)
Glucose, Bld: 102 mg/dL — ABNORMAL HIGH (ref 65–99)
Potassium: 3.9 mmol/L (ref 3.5–5.1)
Sodium: 139 mmol/L (ref 135–145)
Total Bilirubin: 0.5 mg/dL (ref 0.3–1.2)
Total Protein: 7 g/dL (ref 6.5–8.1)

## 2015-07-03 LAB — URINALYSIS, ROUTINE W REFLEX MICROSCOPIC
Bilirubin Urine: NEGATIVE
Glucose, UA: NEGATIVE mg/dL
HGB URINE DIPSTICK: NEGATIVE
Ketones, ur: NEGATIVE mg/dL
Leukocytes, UA: NEGATIVE
Nitrite: NEGATIVE
PH: 5.5 (ref 5.0–8.0)
Protein, ur: NEGATIVE mg/dL
UROBILINOGEN UA: 0.2 mg/dL (ref 0.0–1.0)

## 2015-07-03 LAB — LIPASE, BLOOD: Lipase: 17 U/L — ABNORMAL LOW (ref 22–51)

## 2015-07-03 LAB — POC OCCULT BLOOD, ED: Fecal Occult Bld: NEGATIVE

## 2015-07-03 LAB — I-STAT CG4 LACTIC ACID, ED: LACTIC ACID, VENOUS: 0.67 mmol/L (ref 0.5–2.0)

## 2015-07-03 MED ORDER — PRAVASTATIN SODIUM 80 MG PO TABS: 80.0000 mg | ORAL_TABLET | Freq: Every day | ORAL | Status: DC

## 2015-07-03 MED ORDER — CEPHALEXIN 500 MG PO CAPS: 500.0000 mg | ORAL_CAPSULE | Freq: Four times a day (QID) | ORAL | Status: DC

## 2015-07-03 MED ORDER — SULFAMETHOXAZOLE-TRIMETHOPRIM 800-160 MG PO TABS: 1.0000 | ORAL_TABLET | Freq: Two times a day (BID) | ORAL | Status: AC

## 2015-07-03 MED ORDER — HYDROMORPHONE HCL 1 MG/ML IJ SOLN
1.0000 mg | Freq: Once | INTRAMUSCULAR | Status: AC
  Administered 2015-07-03: 1 mg via INTRAVENOUS
  Filled 2015-07-03: qty 1

## 2015-07-03 MED ORDER — LEVOTHYROXINE SODIUM 125 MCG PO TABS: 125.0000 ug | ORAL_TABLET | Freq: Every day | ORAL | Status: DC

## 2015-07-03 MED ORDER — IOHEXOL 300 MG/ML  SOLN
50.0000 mL | Freq: Once | INTRAMUSCULAR | Status: AC | PRN
  Administered 2015-07-03: 50 mL via ORAL

## 2015-07-03 MED ORDER — ONDANSETRON HCL 4 MG/2ML IJ SOLN
4.0000 mg | Freq: Once | INTRAMUSCULAR | Status: AC
  Administered 2015-07-03: 4 mg via INTRAVENOUS
  Filled 2015-07-03: qty 2

## 2015-07-03 MED ORDER — MORPHINE SULFATE 4 MG/ML IJ SOLN
4.0000 mg | Freq: Once | INTRAMUSCULAR | Status: AC
  Administered 2015-07-03: 4 mg via INTRAVENOUS
  Filled 2015-07-03: qty 1

## 2015-07-03 MED ORDER — SODIUM CHLORIDE 0.9 % IV SOLN
Freq: Once | INTRAVENOUS | Status: AC
  Administered 2015-07-03: 20:00:00 via INTRAVENOUS

## 2015-07-03 MED ORDER — IOHEXOL 300 MG/ML  SOLN
100.0000 mL | Freq: Once | INTRAMUSCULAR | Status: AC | PRN
  Administered 2015-07-03: 100 mL via INTRAVENOUS

## 2015-07-03 NOTE — ED Notes (Signed)
Pt has had bowel fistula, that finally healed, and now this has burst open.

## 2015-07-03 NOTE — ED Provider Notes (Signed)
CSN: [id number]     Arrival date & time 07/03/15  1623 History   First MD Initiated Contact with Patient 07/03/15 1845     Chief Complaint  Patient presents with  . fistula      (Consider location/radiation/quality/duration/timing/severity/associated sxs/prior Treatment) HPI Comments: Patient with complex surgical history for repair of enterocutaneous fistula. Last surgery in November 2015 at [facility] by Dr. Dossie Der. Patient reports acute onset of pain at her surgical site today with bleeding noticed on her clothing. She reports a "gush of blood". Normally she has some spotting intermittently. Her pain is significantly worse and feels a burning inside of her abdomen. She denies any fevers. She denies any vomiting. She denies any diarrhea. She denies any chest pain or shortness of breath. Patient has a history of DVT and PE with IVC filter in place. She called her surgeon was told to come the emergency Department.  The history is provided by the patient.    Past Medical History  Diagnosis Date  . SVT (supraventricular tachycardia)   . Hypercholesterolemia   . Bilateral ovarian cysts   . Perforation bowel   . PE (pulmonary embolism)   . Thyroid disease   . GERD (gastroesophageal reflux disease)   . Sepsis(995.91)   . E coli infection   . Anxiety   . Pneumonia   . Depression   . Carpal tunnel syndrome   . DVT (deep venous thrombosis)   . Anemia   . Irregular heartbeat   . Stroke   . DVT (deep venous thrombosis)   . Blood transfusion without reported diagnosis   . Clotting disorder   . Fibromyalgia   . Hx of migraines   . Interstitial cystitis   . Acute renal failure     renal faliure with surgery   . Cancer     left breast   . Medically noncompliant 02/28/2015  . H/O bilateral salpingo-oophorectomy 02/28/2015   Past Surgical History  Procedure Laterality Date  . Heart ablation    . Vena cava filter placement    . Abdominal hysterectomy    . Hernia repair    . Bowel resection     . Carpal tunnel release    . Bowel fistula    . Breast enhancement surgery  1983  . Removal of breast implants  1993  . Cesarean section  1986  . Colon surgery    . Cosmetic surgery    . Enterocutaneous fistula closure N/A 08/09/2013    [facility], Dr Dossie Der  . Mastectomy    . Breast surgery    . Appendectomy    . Cervical spine surgery    . Fracture surgery      arm right (as a child)    Family History  Problem Relation Age of Onset  . Diabetes Mother   . Heart disease Mother   . Hypertension Mother   . Heart disease Father   . Hyperlipidemia Father   . Hypertension Father   . Cancer Maternal Aunt     colon  . Cancer Maternal Uncle     mets  . Cancer Maternal Grandfather     bone/mets  . Cancer Cousin 19    ovarian  . Cancer Maternal Uncle     prostate   History  Substance Use Topics  . Smoking status: Never Smoker   . Smokeless tobacco: Never Used  . Alcohol Use: No     Comment: rare   OB History    No data available  Review of Systems  Constitutional: Positive for activity change and appetite change. Negative for fever.  Respiratory: Negative for cough, chest tightness and shortness of breath.   Cardiovascular: Negative for chest pain.  Gastrointestinal: Positive for abdominal pain. Negative for nausea and vomiting.  Genitourinary: Negative for dysuria, hematuria, vaginal bleeding and vaginal discharge.  Musculoskeletal: Negative for myalgias and arthralgias.  Skin: Negative for rash.  Neurological: Negative for dizziness, weakness and headaches.  A complete 10 system review of systems was obtained and all systems are negative except as noted in the HPI and PMH.      Allergies  Bisacodyl; Clarithromycin; Clarithromycin; Clindamycin hcl; Codeine; Monosodium glutamate; Nitrofurantoin; Scopolamine hbr; Sulfonamide derivatives; and Tape  Home Medications   Prior to Admission medications   Medication Sig Start Date End Date Taking? Authorizing Provider    ALPRAZolam (XANAX XR) 3 MG 24 hr tablet One tablet once daily Patient taking differently: Take 3 mg by mouth every evening.  06/10/15  Yes Fayrene Helper, MD  butalbital-acetaminophen-caffeine (FIORICET, ESGIC) [phone] MG per tablet TAKE (1) TABLET BY MOUTH TWICE A DAY AS NEEDED FOR MIGRAINES. 02/27/15  Yes Fayrene Helper, MD  conjugated estrogens (PREMARIN) vaginal cream Place 6.378 Applicatorfuls vaginally at bedtime. 04/23/15 04/22/16 Yes Historical Provider, MD  cyclobenzaprine (FLEXERIL) 10 MG tablet Take 1 tablet (10 mg total) by mouth 3 (three) times daily as needed for muscle spasms. 04/02/15  Yes Fayrene Helper, MD  DULoxetine (CYMBALTA) 30 MG capsule Take 30 mg by mouth 2 (two) times daily. 04/23/15 04/22/16 Yes Historical Provider, MD  esomeprazole (NEXIUM) 40 MG capsule Take 1 capsule (40 mg total) by mouth 2 (two) times daily before a meal. 10/24/14  Yes Fayrene Helper, MD  Evening Primrose Oil 500 MG CAPS Take 1,000 mg by mouth at bedtime.   Yes Historical Provider, MD  fenofibrate (TRICOR) 145 MG tablet Take 145 mg by mouth at bedtime.   Yes Historical Provider, MD  Glycerin, Laxative, 80.7 % SUPP Place 1 suppository rectally at bedtime as needed (for constipation).  01/20/15  Yes Historical Provider, MD  hydrocortisone cream 1 % Apply 1 application topically daily as needed for itching (for irritation).  12/14/14 12/14/15 Yes Historical Provider, MD  HYDROmorphone (DILAUDID) 2 MG tablet Take 4-8 mg by mouth 3 (three) times daily as needed for moderate pain or severe pain.  05/17/15  Yes Historical Provider, MD  levothyroxine (SYNTHROID, LEVOTHROID) 125 MCG tablet Take 1 tablet (125 mcg total) by mouth daily before breakfast. 07/03/15  Yes Fayrene Helper, MD  magnesium oxide (MAG-OX) 400 MG tablet Take 400 mg by mouth 2 (two) times daily. 04/23/15 04/22/16 Yes Historical Provider, MD  Multiple Vitamins-Minerals (ICAPS) CAPS Take 1 capsule by mouth at bedtime.    Yes Historical  Provider, MD  ondansetron (ZOFRAN) 4 MG tablet Take 4 mg by mouth every 8 (eight) hours as needed for nausea or vomiting.   Yes Historical Provider, MD  polyethylene glycol (MIRALAX / GLYCOLAX) packet Take 17 g by mouth daily as needed for mild constipation or moderate constipation.    Yes Historical Provider, MD  pravastatin (PRAVACHOL) 80 MG tablet Take 1 tablet (80 mg total) by mouth at bedtime. 07/03/15  Yes Fayrene Helper, MD  promethazine (PHENERGAN) 25 MG tablet Take 25 mg by mouth every 8 (eight) hours as needed for nausea or vomiting.    Yes Historical Provider, MD  senna-docusate (SENOKOT-S) 8.6-50 MG per tablet Take 2 tablets by mouth 4 (four) times  daily. constipation   Yes Historical Provider, MD  verapamil (CALAN) 120 MG tablet Take 120 mg by mouth daily as needed (for SVT).    Yes Historical Provider, MD  vitamin C (ASCORBIC ACID) 500 MG tablet Take 500 mg by mouth daily.    Yes Historical Provider, MD  Vitamin D, Ergocalciferol, (DRISDOL) 50000 UNITS CAPS capsule Take 1 capsule (50,000 Units total) by mouth every 7 (seven) days. Patient taking differently: Take 50,000 Units by mouth every Wednesday.  04/02/15  Yes Fayrene Helper, MD  cephALEXin (KEFLEX) 500 MG capsule Take 1 capsule (500 mg total) by mouth 4 (four) times daily. 07/03/15   Ezequiel Essex, MD  sulfamethoxazole-trimethoprim (BACTRIM DS,SEPTRA DS) 800-160 MG per tablet Take 1 tablet by mouth 2 (two) times daily. 07/03/15 07/10/15  Ezequiel Essex, MD   BP 118/75 mmHg  Pulse 92  Temp(Src) 97.9 F (36.6 C) (Oral)  Resp 20  Ht 5\' 4"  (1.626 m)  Wt 207 lb (93.895 kg)  BMI 35.51 kg/m2  SpO2 95% Physical Exam  Constitutional: She is oriented to person, place, and time. She appears well-developed and well-nourished. No distress.  HENT:  Head: Normocephalic and atraumatic.  Mouth/Throat: Oropharynx is clear and moist. No oropharyngeal exudate.  Eyes: Conjunctivae and EOM are normal. Pupils are equal, round, and reactive  to light.  Neck: Normal range of motion. Neck supple.  No meningismus.  Cardiovascular: Normal rate, regular rhythm, normal heart sounds and intact distal pulses.   No murmur heard. Pulmonary/Chest: Effort normal and breath sounds normal. No respiratory distress.  Abdominal: Soft. There is tenderness. There is no rebound and no guarding.  Well-healed abdominal scars. Tenderness in the right upper quadrant epigastrium without guarding or rebound.  Small area in her scar tissue that has some dried blood. There is no appreciable fluctuance or active bleeding. Mild erythema to abdominal wall  Musculoskeletal: Normal range of motion. She exhibits no edema or tenderness.  Neurological: She is alert and oriented to person, place, and time. No cranial nerve deficit. She exhibits normal muscle tone. Coordination normal.  No ataxia on finger to nose bilaterally. No pronator drift. 5/5 strength throughout. CN 2-12 intact. Negative Romberg. Equal grip strength. Sensation intact. Gait is normal.   Skin: Skin is warm.  Psychiatric: She has a normal mood and affect. Her behavior is normal.  Nursing note and vitals reviewed.     ED Course  Procedures (including critical care time) Labs Review Labs Reviewed  COMPREHENSIVE METABOLIC PANEL - Abnormal; Notable for the following:    Glucose, Bld 102 (*)    BUN 25 (*)    Creatinine, Ser 1.10 (*)    GFR calc non Af Amer 56 (*)    All other components within normal limits  LIPASE, BLOOD - Abnormal; Notable for the following:    Lipase 17 (*)    All other components within normal limits  URINALYSIS, ROUTINE W REFLEX MICROSCOPIC (NOT AT [facility]) - Abnormal; Notable for the following:    Specific Gravity, Urine >1.030 (*)    All other components within normal limits  CBC WITH DIFFERENTIAL/PLATELET  I-STAT CG4 LACTIC ACID, ED  POC OCCULT BLOOD, ED  I-STAT CG4 LACTIC ACID, ED    Imaging Review Ct Abdomen Pelvis W Contrast  07/03/2015   CLINICAL DATA:   Acute onset of mid abdominal pain, with dehiscence of prior surgical site and bleeding. Initial encounter.  EXAM: CT ABDOMEN AND PELVIS WITH CONTRAST  TECHNIQUE: Multidetector CT imaging of the abdomen and  pelvis was performed using the standard protocol following bolus administration of intravenous contrast.  CONTRAST:  162mL OMNIPAQUE IOHEXOL 300 MG/ML  SOLN  COMPARISON:  CT of the abdomen and pelvis performed 08/20/2014  FINDINGS: Minimal bibasilar atelectasis is noted. A right-sided breast implant is noted.  The liver and spleen are unremarkable in appearance. The patient is status post cholecystectomy, with clips noted at the gallbladder fossa. The pancreas and adrenal glands are unremarkable.  The kidneys are unremarkable in appearance. There is no evidence of hydronephrosis. No renal or ureteral stones are seen. No perinephric stranding is appreciated.  No free fluid is identified. The proximal small bowel is unremarkable in appearance. The stomach is within normal limits. No acute vascular abnormalities are seen. An IVC filter is noted in expected position.  Postoperative change is noted at the lower abdomen. [name] ileocolic anastomosis is grossly unremarkable. The remaining colon is unremarkable in appearance.  Postoperative change is noted along the anterior abdominal wall, with mild chronic postoperative soft tissue thickening. No focal fluid collection is seen.  The bladder is mildly distended and grossly unremarkable. The patient is status post hysterectomy. No suspicious adnexal masses are seen. No inguinal lymphadenopathy is seen.  No acute osseous abnormalities are identified.  IMPRESSION: 1. Postoperative change along the anterior abdominal wall, with mild chronic postoperative soft tissue thickening. No focal fluid collection seen. 2. Visualized remaining colon and ileocolic anastomosis are grossly unremarkable in appearance.   Electronically Signed   By: Garald Balding M.D.   On:  07/03/2015 20:53     EKG Interpretation None      MDM   Final diagnoses:  Postoperative pain  Abdominal wall cellulitis   Pain and bleeding from chronic abdominal wound. Mild surrounding erythema.  Labs appear to be at baseline. Patient is afebrile nontoxic appearing.  CT shows chronic postoperative changes with mild soft tissue thickening which appears to be chronic. No appreciable fluid collection.  Results discussed with Dr. Wannetta Sender on-call for Dr. Dossie Der. She agrees with reassuring CT scan, normal labs and afebrile, patient is appropriate for outpatient follow-up. With slight erythema on exam we'll treat for possible cellulitis.  Patient to call Dr. Dossie Der in the morning. Return sooner with fever, worsening pain, spreading redness or any other concerns.  BP 118/75 mmHg  Pulse 92  Temp(Src) 97.9 F (36.6 C) (Oral)  Resp 20  Ht 5\' 4"  (1.626 m)  Wt 207 lb (93.895 kg)  BMI 35.51 kg/m2  SpO2 95%      Ezequiel Essex, MD 07/03/15 2321

## 2015-07-03 NOTE — Discharge Instructions (Signed)
Cellulitis Call Dr. Dossie Der tomorrow. Return to the ED with worsening pain, redness, fever, or any other concerns. Cellulitis is an infection of the skin and the tissue beneath it. The infected area is usually red and tender. Cellulitis occurs most often in the arms and lower legs.  CAUSES  Cellulitis is caused by bacteria that enter the skin through cracks or cuts in the skin. The most common types of bacteria that cause cellulitis are staphylococci and streptococci. SIGNS AND SYMPTOMS   Redness and warmth.  Swelling.  Tenderness or pain.  Fever. DIAGNOSIS  Your health care provider can usually determine what is wrong based on a physical exam. Blood tests may also be done. TREATMENT  Treatment usually involves taking an antibiotic medicine. HOME CARE INSTRUCTIONS   Take your antibiotic medicine as directed by your health care provider. Finish the antibiotic even if you start to feel better.  Keep the infected arm or leg elevated to reduce swelling.  Apply a warm cloth to the affected area up to 4 times per day to relieve pain.  Take medicines only as directed by your health care provider.  Keep all follow-up visits as directed by your health care provider. SEEK MEDICAL CARE IF:   You notice red streaks coming from the infected area.  Your red area gets larger or turns dark in color.  Your bone or joint underneath the infected area becomes painful after the skin has healed.  Your infection returns in the same area or another area.  You notice a swollen bump in the infected area.  You develop new symptoms.  You have a fever. SEEK IMMEDIATE MEDICAL CARE IF:   You feel very sleepy.  You develop vomiting or diarrhea.  You have a general ill feeling (malaise) with muscle aches and pains. MAKE SURE YOU:   Understand these instructions.  Will watch your condition.  Will get help right away if you are not doing well or get worse. Document Released: 09/23/2005 Document  Revised: 04/30/2014 Document Reviewed: 02/29/2012 [facility] Patient Information 2015 [place], [place]. This information is not intended to replace advice given to you by your health care provider. Make sure you discuss any questions you have with your health care provider.

## 2015-07-04 ENCOUNTER — Telehealth: Payer: Self-pay | Admitting: *Deleted

## 2015-07-04 NOTE — Telephone Encounter (Signed)
Pt called requesting silezothyrox 125 mcg, butalbital/apap/ caffine to be refilled, pt said [facility] was supposed to fax a request for her but they take forever and it has been over a week, pt states she is out of the medications. Pt is also requesting the maxsal for her headaches pt said Dr. Moshe Cipro prescribes it to her daughter and they really help her and pt would like to try it. Please advise

## 2015-07-04 NOTE — Telephone Encounter (Signed)
See next telephone message. 

## 2015-07-04 NOTE — Telephone Encounter (Signed)
Pt called [name] during lunch stating she went to the ER last night and they told her to call Dr. Moshe Cipro today pt said she has been having severe margarines  And she wants maxisal called in for this, and she wants the nurse to call her so she can explain the ER visit last night the ER visit was not for margarines. Please advise

## 2015-07-04 NOTE — Telephone Encounter (Signed)
Please advise 

## 2015-07-05 ENCOUNTER — Other Ambulatory Visit: Payer: Self-pay

## 2015-07-05 MED ORDER — LEVOTHYROXINE SODIUM 125 MCG PO TABS: 125.0000 ug | ORAL_TABLET | Freq: Every day | ORAL | Status: DC

## 2015-07-05 MED ORDER — BUTALBITAL-APAP-CAFFEINE 50-325-40 MG PO TABS: ORAL_TABLET | ORAL | Status: DC

## 2015-07-05 NOTE — Telephone Encounter (Signed)
Spoke with patient and she is aware that meds have been refilled.  She states that she will hold on referral to cardio and would rather not have Maxzalt.

## 2015-07-05 NOTE — Telephone Encounter (Signed)
pls refill the fioricet as prescribed x 2, pls send in 1 month with 1 refill of synthroid to local pharmacy Pls explain she needs to see Dr Caryl Comes for cardiology eval as referred since May, due to her heart history, and last year Dr Harl Bowie had ordered a stress test which she did not follow through on, before I am able to  prescribe the maxalt for her

## 2015-07-09 ENCOUNTER — Encounter (HOSPITAL_COMMUNITY): Payer: Medicare Other | Attending: Hematology & Oncology

## 2015-07-09 VITALS — BP 138/70 | HR 88 | Temp 98.4°F | Resp 16

## 2015-07-09 DIAGNOSIS — D509 Iron deficiency anemia, unspecified: Secondary | ICD-10-CM | POA: Insufficient documentation

## 2015-07-09 MED ORDER — SODIUM CHLORIDE 0.9 % IV SOLN
510.0000 mg | Freq: Once | INTRAVENOUS | Status: AC
  Administered 2015-07-09: 510 mg via INTRAVENOUS
  Filled 2015-07-09: qty 17

## 2015-07-09 MED ORDER — SODIUM CHLORIDE 0.9 % IV SOLN
INTRAVENOUS | Status: DC
  Administered 2015-07-09: 14:00:00 via INTRAVENOUS

## 2015-07-09 NOTE — Progress Notes (Signed)
Patient tolerated well.  VSS 30 minutes post infusion.

## 2015-07-11 ENCOUNTER — Telehealth: Payer: Self-pay | Admitting: Family Medicine

## 2015-07-11 NOTE — Telephone Encounter (Signed)
pls print out pt's pain med profile  For the last 2 month, I will need to prescribe med for her to last until July 27, discuss with me in am please

## 2015-07-12 ENCOUNTER — Telehealth: Payer: Self-pay | Admitting: Family Medicine

## 2015-07-12 ENCOUNTER — Ambulatory Visit: Payer: Medicare Other

## 2015-07-12 ENCOUNTER — Other Ambulatory Visit: Payer: Self-pay

## 2015-07-12 DIAGNOSIS — M549 Dorsalgia, unspecified: Secondary | ICD-10-CM

## 2015-07-12 MED ORDER — HYDROMORPHONE HCL 2 MG PO TABS: ORAL_TABLET | ORAL | Status: DC

## 2015-07-12 NOTE — Telephone Encounter (Signed)
Call in directly from her pain Doc at [facility], Dr Ofilia Neas , requesting that I prescribe pain medication for pt, s the distance for her to travel to get meds is great.Her psych assessment deems her a low rik candidate, he states.  I explained to the Doc, that I will not assume responsibility for chronic pain mangement in this pt whose first words are "I want to get off of m pain meds" and she has beenm ion high doses for a long time and contineus to experience pain  I advised that I will try to find a local pain clinic for her , and will make him aware when this is doene  We agree that I will prescribe med to last until her appt on 7/27 which she is to [facility] with him, he will be responsiblr for her pain management until a local pain Doc is founerd.  Pls explain this to patient , and the script may be collected Pls also verify with her that she is aware of her appt with Dr Jeanell Sparrow at [facility] pain clinic on 7/27 at 1:40 pm

## 2015-07-12 NOTE — Telephone Encounter (Signed)
pls spk with pt re local pain management options. Dr Nicholaus Bloom has taken care of her in the past, I believe, other options are in [place], as wellas dr Lyla Son, if she has no preference initiation of soonest available will be done

## 2015-07-12 NOTE — Telephone Encounter (Signed)
Patient aware.

## 2015-07-12 NOTE — Telephone Encounter (Signed)
The last med on the registry was hydromorphone 4mg  prescribed on 6/13 for #150 by you. Her husband has already came to collect a script and I told him I would call when ready

## 2015-07-12 NOTE — Telephone Encounter (Signed)
Patient referred to phillips

## 2015-07-13 ENCOUNTER — Encounter (HOSPITAL_COMMUNITY): Payer: Self-pay | Admitting: Hematology & Oncology

## 2015-07-15 NOTE — Telephone Encounter (Signed)
Completed on 07/12/2015

## 2015-07-25 ENCOUNTER — Telehealth: Payer: Self-pay

## 2015-07-29 ENCOUNTER — Other Ambulatory Visit (HOSPITAL_COMMUNITY): Payer: Self-pay

## 2015-07-29 NOTE — Telephone Encounter (Signed)
Error

## 2015-07-31 ENCOUNTER — Telehealth: Payer: Self-pay

## 2015-07-31 NOTE — Telephone Encounter (Signed)
Spoke with patient and she states that she received a call from Dr. Maretta Bees nurse stating that a mistake was made on their part and that they would like for her to come to [place] to collected corrected prescription.  She is unable to do this due to transportation problems and she has to have someone to drive her that far.   I also spoke with Shanetta from Dr. Maretta Bees office.  I informed that nurse that the prescription can be mailed directly to the pharmacy.  Certified mail if needed.  Patient should not have to travel to collect rx for their mistake.  She states that she will notify Dr.  Informed her that rx will not be written by this office.

## 2015-08-01 ENCOUNTER — Other Ambulatory Visit (HOSPITAL_COMMUNITY): Payer: Medicare Other

## 2015-08-01 ENCOUNTER — Telehealth: Payer: Self-pay | Admitting: *Deleted

## 2015-08-01 ENCOUNTER — Telehealth: Payer: Self-pay

## 2015-08-01 ENCOUNTER — Other Ambulatory Visit: Payer: Self-pay

## 2015-08-01 ENCOUNTER — Ambulatory Visit: Payer: Medicare Other | Admitting: Family Medicine

## 2015-08-01 MED ORDER — HYDROMORPHONE HCL 8 MG PO TABS: 8.0000 mg | ORAL_TABLET | Freq: Three times a day (TID) | ORAL | Status: AC

## 2015-08-01 NOTE — Telephone Encounter (Signed)
I will write one week script only, and will have a note sent to [facility] in this regard

## 2015-08-01 NOTE — Telephone Encounter (Signed)
Pt called [name] stating she needs to speak with the nurse about her meds and she needs another plan because [facility] told her they have to email another doctor and if he does not respond there is nothing they can do about it, pt said Sunday she will be without meds and she rather be detox off the medication than to have withdraws. Please advise

## 2015-08-02 NOTE — Telephone Encounter (Signed)
Patient aware and will come and collect script.  She states that she was contacted by Dr. Maretta Bees office yesterday evening and they state that medicine has been overnighted and that she may receive by Saturday.  She will come by and collect script from here just in case med does not come to pharmacy.  She will hold rx.  If she does not have to fill she will bring script back to office.      She would like for Dr. Moshe Cipro to speak with Dr. Jeanell Sparrow on Wednesday if he calls and voice concerns on care.

## 2015-08-02 NOTE — Telephone Encounter (Signed)
[name] spoke with patient and has it straightened out

## 2015-08-02 NOTE — Telephone Encounter (Signed)
Noted, thanks!

## 2015-08-02 NOTE — Telephone Encounter (Signed)
Called and left message for patient to return call.  

## 2015-08-05 ENCOUNTER — Other Ambulatory Visit (HOSPITAL_COMMUNITY): Payer: Medicare Other

## 2015-08-06 ENCOUNTER — Ambulatory Visit (INDEPENDENT_AMBULATORY_CARE_PROVIDER_SITE_OTHER): Payer: Medicare Other | Admitting: Internal Medicine

## 2015-08-06 ENCOUNTER — Telehealth: Payer: Self-pay | Admitting: *Deleted

## 2015-08-06 VITALS — BP 120/84 | HR 88 | Ht 64.0 in | Wt 231.0 lb

## 2015-08-06 DIAGNOSIS — R079 Chest pain, unspecified: Secondary | ICD-10-CM

## 2015-08-06 DIAGNOSIS — I480 Paroxysmal atrial fibrillation: Secondary | ICD-10-CM

## 2015-08-06 NOTE — Patient Instructions (Signed)
Medication Instructions:  Your physician recommends that you continue on your current medications as directed. Please refer to the Current Medication list given to you today.  Labwork: None ordered  Testing/Procedures: Your physician has requested that you have cardiac CT. Cardiac computed tomography (CT) is a painless test that uses an x-ray machine to take clear, detailed pictures of your heart. For further information please visit [email or web address].   Follow-Up: To be determined after cardiac CT.  Any Other Special Instructions Will Be Listed Below (If Applicable). Thank you for choosing [facility]!!

## 2015-08-06 NOTE — Progress Notes (Signed)
ELECTROPHYSIOLOGY CONSULT NOTE  Patient ID: [name], MRN: [id number], DOB/AGE: 27-Feb-1961 54 y.o. Admit date: (Not on file) Date of Consult: 08/06/2015  Primary Physician: [name], MD Primary Cardiologist: SK  Chief Complaint: chest pain   HPI [name] is a 54 y.o. female seen for  recurring chest pain over the last couple of years. These episodes emanating her chest and into her jaw lasting 30-120 seconds or so. She describes it as "horrific"  It is many years since I have seen her. She has a history of arrhythmias for which she underwent ablation in 1996 and repeat 1997. There have been no significant palpitations.  She has had a series of catastrophic surgical procedures and complications most recently involving her GI tract and the need for multiple surgeries.  She spent the first 10 minutes-15 minutes relating the stories related to her surgical abdominal issues.  She comes in now with complaints of chest pain largely atypical . Sometimes associated with shortness of breath and lightheadedness.  He been increasingly problematic.  Echo 2015  normal   Past Medical History  Diagnosis Date  . SVT (supraventricular tachycardia)   . Hypercholesterolemia   . Bilateral ovarian cysts   . Perforation bowel   . PE (pulmonary embolism)   . Thyroid disease   . GERD (gastroesophageal reflux disease)   . Sepsis(995.91)   . E coli infection   . Anxiety   . Pneumonia   . Depression   . Carpal tunnel syndrome   . DVT (deep venous thrombosis)   . Anemia   . Irregular heartbeat   . Stroke   . DVT (deep venous thrombosis)   . Blood transfusion without reported diagnosis   . Clotting disorder   . Fibromyalgia   . Hx of migraines   . Interstitial cystitis   . Acute renal failure     renal faliure with surgery   . Cancer     left breast   . Medically noncompliant 02/28/2015  . H/O bilateral salpingo-oophorectomy 02/28/2015      Surgical History:  Past Surgical  History  Procedure Laterality Date  . Heart ablation    . Vena cava filter placement    . Abdominal hysterectomy    . Hernia repair    . Bowel resection    . Carpal tunnel release    . Bowel fistula    . Breast enhancement surgery  1983  . Removal of breast implants  1993  . Cesarean section  1986  . Colon surgery    . Cosmetic surgery    . Enterocutaneous fistula closure N/A 08/09/2013    [facility], Dr Dossie Der  . Mastectomy    . Breast surgery    . Appendectomy    . Cervical spine surgery    . Fracture surgery      arm right (as a child)      Home Meds: Prior to Admission medications   Medication Sig Start Date End Date Taking? Authorizing Provider  ALPRAZolam (XANAX XR) 3 MG 24 hr tablet One tablet once daily Patient taking differently: Take 3 mg by mouth every evening.  06/10/15  Yes Fayrene Helper, MD  butalbital-acetaminophen-caffeine (FIORICET, ESGIC) [phone] MG per tablet TAKE (1) TABLET BY MOUTH TWICE A DAY AS NEEDED FOR MIGRAINES. 07/05/15  Yes Fayrene Helper, MD  cephALEXin (KEFLEX) 500 MG capsule Take 1 capsule (500 mg total) by mouth 4 (four) times daily. 07/03/15  Yes Ezequiel Essex, MD  conjugated estrogens (  PREMARIN) vaginal cream Place 5.056 Applicatorfuls vaginally at bedtime. 04/23/15 04/22/16 Yes Historical Provider, MD  cyclobenzaprine (FLEXERIL) 10 MG tablet Take 1 tablet (10 mg total) by mouth 3 (three) times daily as needed for muscle spasms. 04/02/15  Yes Fayrene Helper, MD  DULoxetine (CYMBALTA) 30 MG capsule Take 30 mg by mouth 2 (two) times daily. 04/23/15 04/22/16 Yes Historical Provider, MD  esomeprazole (NEXIUM) 40 MG capsule Take 1 capsule (40 mg total) by mouth 2 (two) times daily before a meal. 10/24/14  Yes Fayrene Helper, MD  Evening Primrose Oil 500 MG CAPS Take 1,000 mg by mouth at bedtime.   Yes Historical Provider, MD  fenofibrate (TRICOR) 145 MG tablet Take 145 mg by mouth at bedtime.   Yes Historical Provider, MD  Glycerin, Laxative,  80.7 % SUPP Place 1 suppository rectally at bedtime as needed (for constipation).  01/20/15  Yes Historical Provider, MD  hydrocortisone cream 1 % Apply 1 application topically daily as needed for itching (for irritation).  12/14/14 12/14/15 Yes Historical Provider, MD  HYDROmorphone (DILAUDID) 8 MG tablet Take 1 tablet (8 mg total) by mouth 3 (three) times daily. 08/24/15 08/31/15 Yes Fayrene Helper, MD  levothyroxine (SYNTHROID, LEVOTHROID) 125 MCG tablet Take 1 tablet (125 mcg total) by mouth daily before breakfast. 07/05/15  Yes Fayrene Helper, MD  magnesium oxide (MAG-OX) 400 MG tablet Take 400 mg by mouth 2 (two) times daily. 04/23/15 04/22/16 Yes Historical Provider, MD  Multiple Vitamins-Minerals (ICAPS) CAPS Take 1 capsule by mouth at bedtime.    Yes Historical Provider, MD  ondansetron (ZOFRAN) 4 MG tablet Take 4 mg by mouth every 8 (eight) hours as needed for nausea or vomiting.   Yes Historical Provider, MD  polyethylene glycol (MIRALAX / GLYCOLAX) packet Take 17 g by mouth daily as needed for mild constipation or moderate constipation.    Yes Historical Provider, MD  pravastatin (PRAVACHOL) 80 MG tablet Take 1 tablet (80 mg total) by mouth at bedtime. 07/03/15  Yes Fayrene Helper, MD  promethazine (PHENERGAN) 25 MG tablet Take 25 mg by mouth every 8 (eight) hours as needed for nausea or vomiting.    Yes Historical Provider, MD  senna-docusate (SENOKOT-S) 8.6-50 MG per tablet Take 2 tablets by mouth 4 (four) times daily. constipation   Yes Historical Provider, MD  verapamil (CALAN) 120 MG tablet Take 120 mg by mouth daily as needed (for SVT).    Yes Historical Provider, MD  vitamin C (ASCORBIC ACID) 500 MG tablet Take 500 mg by mouth daily.    Yes Historical Provider, MD  Vitamin D, Ergocalciferol, (DRISDOL) 50000 UNITS CAPS capsule Take 1 capsule (50,000 Units total) by mouth every 7 (seven) days. Patient taking differently: Take 50,000 Units by mouth every Wednesday.  04/02/15  Yes  Fayrene Helper, MD  HYDROmorphone (DILAUDID) 2 MG tablet Take 4-8 mg by mouth 3 (three) times daily as needed for moderate pain or severe pain.  05/17/15   Historical Provider, MD  HYDROmorphone (DILAUDID) 2 MG tablet Take   4 to 8   mg three times daily for pain Patient not taking: Reported on 08/06/2015 07/12/15   Fayrene Helper, MD       Allergies:  Allergies  Allergen Reactions  . Bisacodyl Other (See Comments)    Makes patient feel like she is having cramps  . Clarithromycin Hives  . Clarithromycin Other (See Comments)    Cluster migraines   . Clindamycin Hcl Hives  . Codeine  Itching  . Monosodium Glutamate Other (See Comments)    Cluster migraines  . Nitrofurantoin Hives  . Scopolamine Hbr Other (See Comments)    Cluster migraines, impaired vision  . Tape Other (See Comments)    Blistering, use paper tape     History   Social History  . Marital Status: Married    Spouse Name: N/A  . Number of Children: N/A  . Years of Education: N/A   Occupational History  . Not on file.   Social History Main Topics  . Smoking status: Never Smoker   . Smokeless tobacco: Never Used  . Alcohol Use: No     Comment: rare  . Drug Use: No  . Sexual Activity: Yes    Birth Control/ Protection: Surgical   Other Topics Concern  . Not on file   Social History Narrative     Family History  Problem Relation Age of Onset  . Diabetes Mother   . Heart disease Mother   . Hypertension Mother   . Heart disease Father   . Hyperlipidemia Father   . Hypertension Father   . Cancer Maternal Aunt     colon  . Cancer Maternal Uncle     mets  . Cancer Maternal Grandfather     bone/mets  . Cancer Cousin 19    ovarian  . Cancer Maternal Uncle     prostate     ROS:  Please see the history of present illness.  EXTENSIVE  All other systems reviewed and negative.    Physical Exam   Blood pressure 120/84, pulse 88, height 5\' 4"  (1.626 m), weight 231 lb (104.781 kg). General:  Well developed, well nourished female in no acute distress. Head: Normocephalic, atraumatic, sclera non-icteric, no xanthomas, nares are without discharge. EENT: normal Lymph Nodes:  none Back: without scoliosis/kyphosis  no CVA tendersness Neck: Negative for carotid bruits. JVD not elevated. Lungs: Clear bilaterally to auscultation without wheezes, rales, or rhonchi. Breathing is unlabored. Heart: RRR with S1 S2. 2/6 systolic  murmur , rubs, or gallops appreciated. Abdomen: Soft,  normoactive bowel sounds. No hepatomegaly.   Msk:  Strength and tone appear normal for age. Extremities: No clubbing or cyanosis. No  edema.  Distal pedal pulses are 2+ and equal bilaterally. Skin: Warm and Dry Neuro: Alert and oriented X 3. CN III-XII intact Grossly normal sensory and motor function . Psych:  Responds to questions appropriately with a normal affect.      Labs: Cardiac Enzymes No results for input(s): CKTOTAL, CKMB, TROPONINI in the last 72 hours. CBC Lab Results  Component Value Date   WBC 6.9 07/03/2015   HGB 13.2 07/03/2015   HCT 39.8 07/03/2015   MCV 88.4 07/03/2015   PLT 168 07/03/2015   PROTIME: No results for input(s): LABPROT, INR in the last 72 hours. Chemistry No results for input(s): NA, K, CL, CO2, BUN, CREATININE, CALCIUM, PROT, BILITOT, ALKPHOS, ALT, AST, GLUCOSE in the last 168 hours.  Invalid input(s): LABALBU Lipids Lab Results  Component Value Date   CHOL 129 02/25/2015   HDL 42* 02/25/2015   LDLCALC 51 02/25/2015   TRIG 180* 02/25/2015   BNP No results found for: PROBNP Thyroid Function Tests: No results for input(s): TSH, T4TOTAL, T3FREE, THYROIDAB in the last 72 hours.  Invalid input(s): FREET3    Miscellaneous Lab Results  Component Value Date   DDIMER 0.42 09/01/2011    Radiology/Studies:  No results found.  EKG:  Normal sinus without acute ST changes  Assessment and Plan:  Chest pain  Morbid obesity  The patient has atypical chest  pain with a ECG while not strikingly abnormal with flattened ST segments. Given the progressive nature and somewhat exertional nature of her discomfort and her family history, excluding coronary disease and risk stratification would be ideal. She is not able to ambulate given her surgical history. We will pursue Myoview scanning.     Virl Axe

## 2015-08-06 NOTE — Telephone Encounter (Signed)
Pt called [facility] for Courtney. Pt stated she received an over night fax today for pain medication a quaintly of 60 for the rest of this month and 60 for next month she did not contact him back saying anything about the quaintly since she want be seeing him anymore, pt said she is bringing by 2 rx for [place] to make a copy of.  Pt said she is currently on her way to a doctors appt in [place] now so if we cant get in touch with her

## 2015-08-07 ENCOUNTER — Telehealth: Payer: Self-pay | Admitting: Family Medicine

## 2015-08-07 NOTE — Telephone Encounter (Signed)
Patient is calling asking for [facility] regarding pain clinic information from [place], and also she has filled the Rx for HYDROmorphone (DILAUDID) 8 MG tablet and she will bring the other Rx by to be shredded.

## 2015-08-09 ENCOUNTER — Other Ambulatory Visit (HOSPITAL_COMMUNITY): Payer: Medicare Other

## 2015-08-09 ENCOUNTER — Other Ambulatory Visit: Payer: Self-pay | Admitting: *Deleted

## 2015-08-09 DIAGNOSIS — Z01812 Encounter for preprocedural laboratory examination: Secondary | ICD-10-CM

## 2015-08-09 DIAGNOSIS — R079 Chest pain, unspecified: Secondary | ICD-10-CM

## 2015-08-12 ENCOUNTER — Other Ambulatory Visit: Payer: Self-pay | Admitting: Family Medicine

## 2015-08-12 ENCOUNTER — Telehealth: Payer: Self-pay | Admitting: Family Medicine

## 2015-08-12 DIAGNOSIS — G894 Chronic pain syndrome: Secondary | ICD-10-CM

## 2015-08-12 NOTE — Telephone Encounter (Signed)
Noted.  Staff working on referral.

## 2015-08-12 NOTE — Telephone Encounter (Signed)
See telephone message from 8/15

## 2015-08-12 NOTE — Telephone Encounter (Signed)
Direct contact made wioth pt. States she has enough script written to last till her September appt at [facility], but is wanting as is that Provider a closer Provider, also mentioned that she had canceled a referral made by pain Doc at [facility] Referral to Dr Hardin Negus who she saw in the past has been "on hold" for over 1 month, will refer to Dr Francesco Runner, in [place] , she is aware and agrees  She WILL bring the script from this office which was written 8/04 which should have been returned and shredded already, this will be shredded, she will also bring in for scanning purposes only the 2 additional scripts which she states she has from [facility], nursing staff in this office was of the impression that the scripts would have been mailed directly to her pharmacy, dtatbase search and pharmacy call to be made by nurse as this is a confused situation  Copies of the script from [facility] will be brought just for scanning , she is to [place] that the ONE PAIN PRESCRIBER ONLY Is prescribing for her

## 2015-08-13 ENCOUNTER — Telehealth: Payer: Self-pay | Admitting: Internal Medicine

## 2015-08-13 ENCOUNTER — Encounter (HOSPITAL_COMMUNITY): Payer: Medicare Other | Attending: Hematology & Oncology

## 2015-08-13 DIAGNOSIS — R790 Abnormal level of blood mineral: Secondary | ICD-10-CM

## 2015-08-13 DIAGNOSIS — D509 Iron deficiency anemia, unspecified: Secondary | ICD-10-CM | POA: Insufficient documentation

## 2015-08-13 LAB — CBC WITH DIFFERENTIAL/PLATELET
BASOS PCT: 1 % (ref 0–1)
Basophils Absolute: 0 10*3/uL (ref 0.0–0.1)
EOS PCT: 1 % (ref 0–5)
Eosinophils Absolute: 0.1 10*3/uL (ref 0.0–0.7)
HCT: 41.9 % (ref 36.0–46.0)
Hemoglobin: 13.8 g/dL (ref 12.0–15.0)
Lymphocytes Relative: 34 % (ref 12–46)
Lymphs Abs: 1.6 10*3/uL (ref 0.7–4.0)
MCH: 29.9 pg (ref 26.0–34.0)
MCHC: 32.9 g/dL (ref 30.0–36.0)
MCV: 90.9 fL (ref 78.0–100.0)
MONO ABS: 0.3 10*3/uL (ref 0.1–1.0)
Monocytes Relative: 6 % (ref 3–12)
Neutro Abs: 2.7 10*3/uL (ref 1.7–7.7)
Neutrophils Relative %: 58 % (ref 43–77)
PLATELETS: 188 10*3/uL (ref 150–400)
RBC: 4.61 MIL/uL (ref 3.87–5.11)
RDW: 14.3 % (ref 11.5–15.5)
WBC: 4.6 10*3/uL (ref 4.0–10.5)

## 2015-08-13 LAB — IRON AND TIBC
IRON: 123 ug/dL (ref 28–170)
SATURATION RATIOS: 30 % (ref 10.4–31.8)
TIBC: 406 ug/dL (ref 250–450)
UIBC: 283 ug/dL

## 2015-08-13 LAB — COMPREHENSIVE METABOLIC PANEL
ALT: 30 U/L (ref 14–54)
AST: 30 U/L (ref 15–41)
Albumin: 4.3 g/dL (ref 3.5–5.0)
Alkaline Phosphatase: 58 U/L (ref 38–126)
Anion gap: 10 (ref 5–15)
BUN: 21 mg/dL — AB (ref 6–20)
CO2: 26 mmol/L (ref 22–32)
CREATININE: 0.96 mg/dL (ref 0.44–1.00)
Calcium: 9.2 mg/dL (ref 8.9–10.3)
Chloride: 103 mmol/L (ref 101–111)
GFR calc Af Amer: >60 mL/min (ref >60)
Glucose, Bld: 113 mg/dL — ABNORMAL HIGH (ref 65–99)
POTASSIUM: 3.7 mmol/L (ref 3.5–5.1)
Sodium: 139 mmol/L (ref 135–145)
Total Bilirubin: 0.6 mg/dL (ref 0.3–1.2)
Total Protein: 7.2 g/dL (ref 6.5–8.1)

## 2015-08-13 LAB — FERRITIN: FERRITIN: 85 ng/mL (ref 11–307)

## 2015-08-13 NOTE — Progress Notes (Signed)
[name] presented for labwork. Labs per MD order drawn via Peripheral Line 23 gauge needle inserted in right AC  Good blood return present. Procedure without incident.  Needle removed intact. Patient tolerated procedure well.

## 2015-08-13 NOTE — Telephone Encounter (Signed)
Spoke with patient to schedule the CT CARDIAC SCORING test.  Explain that the cost of the test is $150.00.  She stated the Dr. Caryl Comes has ask her if she had meet her deductible because he wanted her to do this test.  The test he ordered was for the score only, which patient has to pay out of pocket.  At this time she can't do because she can't afford the test.  Please advised.

## 2015-08-16 ENCOUNTER — Telehealth: Payer: Self-pay | Admitting: *Deleted

## 2015-08-16 DIAGNOSIS — R079 Chest pain, unspecified: Secondary | ICD-10-CM

## 2015-08-16 NOTE — Telephone Encounter (Signed)
Patient is agreeable to [facility] test. Aware someone will call her next week to arrange this testing.

## 2015-08-16 NOTE — Telephone Encounter (Signed)
[name] at 08/13/2015 11:38 AM     Status: Signed       Expand All Collapse All   Spoke with patient to schedule the CT CARDIAC SCORING test. Explain that the cost of the test is $150.00. She stated the Dr. Caryl Comes has ask her if she had meet her deductible because he wanted her to do this test. The test he ordered was for the score only, which patient has to pay out of pocket. At this time she can't do because she can't afford the test. Please advised.

## 2015-08-16 NOTE — Telephone Encounter (Signed)
Call from patient.  Wants to know if she needs to come in for an iron infusion?

## 2015-08-20 ENCOUNTER — Telehealth: Payer: Self-pay | Admitting: *Deleted

## 2015-08-20 NOTE — Telephone Encounter (Signed)
I spoke with [name] today she called to change her appt, [name] states someone called her yesterday from a unknown number and she doesn't remember who it was and [name] stated they were very hateful to her and rude and treated her as if she was a drug addict [name] said they were not professional at all, [name] stated they told her she may not get to be seen there if they don't accept her case. I called Dr. Franki Cabot office and spoke with Larene Beach today to check status of [name]'s referral she said yesterday 08/19/15 Dr. Lyla Son requested notes and injections from [facility] and stated [name] told them their is a another pain clinic she went too but cannot remember but Dr. Moshe Cipro will know, Dr. Lyla Son also wants notes from there aswell. Please advise

## 2015-08-22 ENCOUNTER — Other Ambulatory Visit: Payer: Self-pay

## 2015-08-22 ENCOUNTER — Other Ambulatory Visit (HOSPITAL_COMMUNITY): Payer: Self-pay | Admitting: Hematology & Oncology

## 2015-08-22 DIAGNOSIS — R790 Abnormal level of blood mineral: Secondary | ICD-10-CM

## 2015-08-22 DIAGNOSIS — D509 Iron deficiency anemia, unspecified: Secondary | ICD-10-CM

## 2015-08-22 MED ORDER — FENOFIBRATE 145 MG PO TABS: 145.0000 mg | ORAL_TABLET | Freq: Every day | ORAL | Status: DC

## 2015-08-22 NOTE — Telephone Encounter (Signed)
Do you know of any other pain management providers that the patient has seen before other than at [facility]?

## 2015-08-22 NOTE — Telephone Encounter (Signed)
Dr Elta Guadeloupe Phullips I thuink has seen her in the past

## 2015-08-23 ENCOUNTER — Other Ambulatory Visit: Payer: Self-pay | Admitting: Family Medicine

## 2015-08-23 ENCOUNTER — Ambulatory Visit
Admission: RE | Admit: 2015-08-23 | Discharge: 2015-08-23 | Disposition: A | Discharge status: Home / Self Care | Payer: Medicare Other | Source: Ambulatory Visit | Attending: Family Medicine | Admitting: Family Medicine

## 2015-08-23 DIAGNOSIS — Z853 Personal history of malignant neoplasm of breast: Secondary | ICD-10-CM

## 2015-08-23 DIAGNOSIS — Z9012 Acquired absence of left breast and nipple: Secondary | ICD-10-CM

## 2015-08-23 DIAGNOSIS — T8543XA Leakage of breast prosthesis and implant, initial encounter: Secondary | ICD-10-CM

## 2015-08-23 DIAGNOSIS — Z1231 Encounter for screening mammogram for malignant neoplasm of breast: Secondary | ICD-10-CM

## 2015-08-23 DIAGNOSIS — R234 Changes in skin texture: Secondary | ICD-10-CM

## 2015-08-23 DIAGNOSIS — N644 Mastodynia: Secondary | ICD-10-CM

## 2015-08-23 NOTE — Telephone Encounter (Signed)
Noted  

## 2015-08-26 ENCOUNTER — Telehealth: Payer: Self-pay | Admitting: *Deleted

## 2015-08-26 NOTE — Telephone Encounter (Signed)
Pt called [name] stating she went to the breast center and they sent her home, pt states they need the diagnostic codes and they think she may have a aslow leak on the right one. Please advise

## 2015-08-28 ENCOUNTER — Ambulatory Visit (HOSPITAL_COMMUNITY): Payer: Medicare Other

## 2015-08-28 DIAGNOSIS — M48061 Spinal stenosis, lumbar region without neurogenic claudication: Secondary | ICD-10-CM | POA: Insufficient documentation

## 2015-08-28 NOTE — Telephone Encounter (Signed)
Patient has to just call back to the breast center to schedule appt per courtney

## 2015-08-29 ENCOUNTER — Encounter (HOSPITAL_COMMUNITY): Payer: Medicare Other | Attending: Hematology & Oncology

## 2015-08-29 VITALS — BP 110/74 | HR 85 | Temp 98.2°F | Resp 18

## 2015-08-29 DIAGNOSIS — D509 Iron deficiency anemia, unspecified: Secondary | ICD-10-CM | POA: Diagnosis not present

## 2015-08-29 MED ORDER — SODIUM CHLORIDE 0.9 % IV SOLN
INTRAVENOUS | Status: DC
  Administered 2015-08-29: 14:00:00 via INTRAVENOUS

## 2015-08-29 MED ORDER — SODIUM CHLORIDE 0.9 % IV SOLN
125.0000 mg | Freq: Once | INTRAVENOUS | Status: AC
  Administered 2015-08-29: 125 mg via INTRAVENOUS
  Filled 2015-08-29: qty 10

## 2015-08-29 NOTE — Progress Notes (Signed)
Patient tolerated infusion well.  VSS 30 minutes post infusion.   

## 2015-09-03 ENCOUNTER — Other Ambulatory Visit: Payer: Self-pay | Admitting: Family Medicine

## 2015-09-03 DIAGNOSIS — N644 Mastodynia: Secondary | ICD-10-CM

## 2015-09-03 DIAGNOSIS — R234 Changes in skin texture: Secondary | ICD-10-CM

## 2015-09-03 DIAGNOSIS — Z9012 Acquired absence of left breast and nipple: Secondary | ICD-10-CM

## 2015-09-03 DIAGNOSIS — T8543XA Leakage of breast prosthesis and implant, initial encounter: Secondary | ICD-10-CM

## 2015-09-03 DIAGNOSIS — Z853 Personal history of malignant neoplasm of breast: Secondary | ICD-10-CM

## 2015-09-09 NOTE — Telephone Encounter (Signed)
I called patient and she stated that she has rescheduled her appt.

## 2015-09-11 ENCOUNTER — Ambulatory Visit: Payer: Medicare Other | Admitting: Family Medicine

## 2015-09-12 ENCOUNTER — Other Ambulatory Visit (HOSPITAL_COMMUNITY): Payer: Medicare Other

## 2015-09-13 ENCOUNTER — Other Ambulatory Visit: Payer: Self-pay

## 2015-09-13 MED ORDER — PANTOPRAZOLE SODIUM 40 MG PO TBEC: 40.0000 mg | DELAYED_RELEASE_TABLET | Freq: Every day | ORAL | Status: DC

## 2015-09-16 ENCOUNTER — Telehealth (HOSPITAL_COMMUNITY): Payer: Self-pay | Admitting: *Deleted

## 2015-09-16 NOTE — Telephone Encounter (Signed)
Patient sick and needs to call us back to reschedule.Hasspacher, Ranae [place]

## 2015-09-17 ENCOUNTER — Telehealth: Payer: Self-pay | Admitting: *Deleted

## 2015-09-17 NOTE — Telephone Encounter (Signed)
Form filled out to [place] flexeril

## 2015-09-17 NOTE — Telephone Encounter (Signed)
[name] called stating [employer] is going to fax over a form that needs to be filled out and sent back by 72 hours stating she needs the Tier Dropped for Flexiril it is going to cost her 400$, patient stated the flexiril is the only thing she can take the other muscle relaxers she has a allergic reaction too, patient said she needs that on the form

## 2015-09-18 ENCOUNTER — Encounter (HOSPITAL_COMMUNITY): Payer: Medicare Other

## 2015-09-19 ENCOUNTER — Other Ambulatory Visit: Payer: Self-pay

## 2015-09-19 ENCOUNTER — Telehealth: Payer: Self-pay | Admitting: *Deleted

## 2015-09-19 DIAGNOSIS — D509 Iron deficiency anemia, unspecified: Secondary | ICD-10-CM

## 2015-09-19 DIAGNOSIS — R790 Abnormal level of blood mineral: Secondary | ICD-10-CM

## 2015-09-19 MED ORDER — DULOXETINE HCL 30 MG PO CPEP: 30.0000 mg | ORAL_CAPSULE | Freq: Two times a day (BID) | ORAL | Status: DC

## 2015-09-19 NOTE — Telephone Encounter (Signed)
[name] called stating she needs her symbotha 30 mg refilled in a 90 day supply and her flexaril she stated the generic teir approved her medication

## 2015-09-19 NOTE — Telephone Encounter (Signed)
Medication refilled to [facility]

## 2015-09-23 ENCOUNTER — Encounter: Payer: Self-pay | Admitting: Family Medicine

## 2015-09-23 ENCOUNTER — Ambulatory Visit (INDEPENDENT_AMBULATORY_CARE_PROVIDER_SITE_OTHER): Payer: Medicare Other | Admitting: Family Medicine

## 2015-09-23 VITALS — BP 108/74 | HR 93 | Resp 16 | Ht 64.0 in | Wt 226.0 lb

## 2015-09-23 DIAGNOSIS — M5442 Lumbago with sciatica, left side: Secondary | ICD-10-CM

## 2015-09-23 DIAGNOSIS — R790 Abnormal level of blood mineral: Secondary | ICD-10-CM

## 2015-09-23 DIAGNOSIS — F411 Generalized anxiety disorder: Secondary | ICD-10-CM

## 2015-09-23 DIAGNOSIS — Z23 Encounter for immunization: Secondary | ICD-10-CM | POA: Diagnosis not present

## 2015-09-23 DIAGNOSIS — R51 Headache: Secondary | ICD-10-CM

## 2015-09-23 DIAGNOSIS — E039 Hypothyroidism, unspecified: Secondary | ICD-10-CM

## 2015-09-23 DIAGNOSIS — E785 Hyperlipidemia, unspecified: Secondary | ICD-10-CM | POA: Diagnosis not present

## 2015-09-23 DIAGNOSIS — R519 Headache, unspecified: Secondary | ICD-10-CM

## 2015-09-23 DIAGNOSIS — Z1159 Encounter for screening for other viral diseases: Secondary | ICD-10-CM

## 2015-09-23 DIAGNOSIS — M5441 Lumbago with sciatica, right side: Secondary | ICD-10-CM

## 2015-09-23 MED ORDER — ALPRAZOLAM 1 MG PO TABS: 1.0000 mg | ORAL_TABLET | Freq: Two times a day (BID) | ORAL | Status: DC | PRN

## 2015-09-23 MED ORDER — CYCLOBENZAPRINE HCL 10 MG PO TABS: 10.0000 mg | ORAL_TABLET | Freq: Two times a day (BID) | ORAL | Status: DC | PRN

## 2015-09-23 NOTE — Assessment & Plan Note (Addendum)
Improved. Patient re-educated about  the importance of commitment to a  minimum of 150 minutes of exercise per week.  The importance of healthy food choices with portion control discussed. Encouraged to start a food diary, count calories and to consider  joining a support group. Sample diet sheets offered. Goals set by the patient for the next several months.   Weight /BMI 09/23/2015 08/06/2015 07/03/2015  WEIGHT 226 lb 231 lb 207 lb  HEIGHT 5\' 4"  5\' 4"  5\' 4"   BMI 38.77 kg/m2 39.63 kg/m2 35.51 kg/m2  Some encounter information is confidential and restricted. Go to Review Flowsheets activity to see all data.    Current exercise per week 90 minutes.

## 2015-09-23 NOTE — Assessment & Plan Note (Addendum)
Reports less reliance on pain medication, and is being followed by pain clinic States the epidural injections she recently got have helped

## 2015-09-23 NOTE — Progress Notes (Signed)
Subjective:    Patient ID: [name], female    DOB: February 06, 1961, 54 y.o.   MRN: [id number]  HPI   [name]     MRN: [id number]      DOB: 07/19/1961   HPI [name] is here for follow up and re-evaluation of chronic medical conditions, medication management and review of any available recent lab and radiology data.  Preventive health is updated, specifically  Cancer screening and Immunization.   Questions or concerns regarding consultations or procedures which the PT has had in the interim are  Addressed.Still getting pain management from [facility] , weaning down her medication on her own, is afraid of withdrawals, has upcoming appt in 3 days Requests changing back to short acting says it works better for her , lower dose will be fine, also requires 3 times daily flexeril The PT denies any adverse reactions to current medications since the last visit.  C/o increased and uncontrolled headaches in past several weeks, has benefited in the past from botox   ROS Denies recent fever or chills. Denies sinus pressure, nasal congestion, ear pain or sore throat. Denies chest congestion, productive cough or wheezing. Denies chest pains, palpitations and leg swelling Denies abdominal pain, nausea, vomiting,diarrhea or constipation.   Denies dysuria, frequency, hesitancy or incontinence. Denies uncontrolled joint pain, swelling and limitation in mobility.Currently weaning down doses of her dilaudid, seen at [facility] pain clinic still  Denies depression, anxiety or insomnia. Denies skin break down or rash.   PE  BP 108/74 mmHg  Pulse 93  Resp 16  Ht 5\' 4"  (1.626 m)  Wt 226 lb (102.513 kg)  BMI 38.77 kg/m2  SpO2 99%  Patient alert and oriented and in no cardiopulmonary distress.  HEENT: No facial asymmetry, EOMI,   oropharynx pink and moist.  Neck supple no JVD, no mass.  Chest: Clear to auscultation bilaterally.  CVS: S1, S2 no murmurs, no S3.Regular rate.  ABD: Soft non tender.     Ext: No edema  MS: Adequate ROM spine, shoulders, hips and knees.  Skin: Intact, no ulcerations or rash noted.  Psych: Good eye contact, normal affect. Memory intact not anxious or depressed appearing.  CNS: CN 2-12 intact, power,  normal throughout.no focal deficits noted.   Assessment & Plan   Headache disorder Worsening headaches in past 6 weeks, daily, rated at a 6 to a 10, not responding ,has had good result with botox, wishes to return to headache clinic  Morbid obesity Improved. Patient re-educated about  the importance of commitment to a  minimum of 150 minutes of exercise per week.  The importance of healthy food choices with portion control discussed. Encouraged to start a food diary, count calories and to consider  joining a support group. Sample diet sheets offered. Goals set by the patient for the next several months.   Weight /BMI 09/23/2015 08/06/2015 07/03/2015  WEIGHT 226 lb 231 lb 207 lb  HEIGHT 5\' 4"  5\' 4"  5\' 4"   BMI 38.77 kg/m2 39.63 kg/m2 35.51 kg/m2  Some encounter information is confidential and restricted. Go to Review Flowsheets activity to see all data.    Current exercise per week 90 minutes.   Hypothyroidism Updated lab needed at/ before next visit.   Hyperlipidemia LDL goal <100 Hyperlipidemia:Low fat diet discussed and encouraged.   Lipid Panel  Lab Results  Component Value Date   CHOL 129 02/25/2015   HDL 42* 02/25/2015   LDLCALC 51 02/25/2015   LDLDIRECT 138* 09/15/2012  TRIG 180* 02/25/2015   CHOLHDL 3.1 02/25/2015   Updated lab needed at/ before next visit.      Low ferritin level benefiting form parenteral iron  Backache Reports less reliance on pain medication, and is being followed by pain clinic States the epidural injections she recently got have helped  Need for prophylactic vaccination and inoculation against influenza After obtaining informed consent, the vaccine is  administered by LPN.   FIBROMYALGIA Pt  approved for flexeril, script for twice daily sent in  Anxiety state Reports better response to short acting xanax and will use twice daily, lower dose, new script started     Review of Systems     Objective:   Physical Exam        Assessment & Plan:

## 2015-09-23 NOTE — Assessment & Plan Note (Signed)
benefiting form parenteral iron

## 2015-09-23 NOTE — Assessment & Plan Note (Signed)
After obtaining informed consent, the vaccine is  administered by LPN.  

## 2015-09-23 NOTE — Assessment & Plan Note (Signed)
Pt approved for flexeril, script for twice daily sent in

## 2015-09-23 NOTE — Assessment & Plan Note (Addendum)
Worsening headaches in past 6 weeks, daily, rated at a 6 to a 10, not responding ,has had good result with botox, wishes to return to headache clinic

## 2015-09-23 NOTE — Assessment & Plan Note (Signed)
Updated lab needed at/ before next visit.   

## 2015-09-23 NOTE — Assessment & Plan Note (Signed)
Hyperlipidemia:Low fat diet discussed and encouraged.   Lipid Panel  Lab Results  Component Value Date   CHOL 129 02/25/2015   HDL 42* 02/25/2015   LDLCALC 51 02/25/2015   LDLDIRECT 138* 09/15/2012   TRIG 180* 02/25/2015   CHOLHDL 3.1 02/25/2015   Updated lab needed at/ before next visit.

## 2015-09-23 NOTE — Assessment & Plan Note (Signed)
Reports better response to short acting xanax and will use twice daily, lower dose, new script started

## 2015-09-23 NOTE — Patient Instructions (Signed)
F/u in 4 month, call if you need me before  Thankful that yopu are greatly improved  Labs and flu vaccine today  Flexeril twice daily , 3 month supply  Xanax changed to shrt acting form twice daily  Please work on good  health habits so that your health will improve. 1. Commitment to daily physical activity for 30 to 60  minutes, if you are able to do this.  2. Commitment to wise food choices. Aim for half of your  food intake to be vegetable and fruit, one quarter starchy foods, and one quarter protein. Try to eat on a regular schedule  3 meals per day, snacking between meals should be limited to vegetables or fruits or small portions of nuts. 64 ounces of water per day is generally recommended, unless you have specific health conditions, like heart failure or kidney failure where you will need to limit fluid intake.  3. Commitment to sufficient and a  good quality of physical and mental rest daily, generally between 6 to 8 hours per day.  WITH PERSISTANCE AND PERSEVERANCE, THE IMPOSSIBLE , BECOMES THE NORM!  Thanks for choosing [facility], we consider it a privelige to serve you.

## 2015-09-24 ENCOUNTER — Telehealth: Payer: Self-pay | Admitting: Family Medicine

## 2015-09-24 NOTE — Telephone Encounter (Signed)
Pls call pt re lab results, info is documented on lab sheet in your area, thanks

## 2015-09-26 NOTE — Telephone Encounter (Signed)
Patient aware of results.

## 2015-10-01 ENCOUNTER — Other Ambulatory Visit: Payer: Self-pay

## 2015-10-01 ENCOUNTER — Telehealth: Payer: Self-pay | Admitting: Family Medicine

## 2015-10-01 DIAGNOSIS — R197 Diarrhea, unspecified: Secondary | ICD-10-CM

## 2015-10-01 NOTE — Telephone Encounter (Signed)
Patient states all is back to normal now and she feels much better. Will call back if anything changes

## 2015-10-01 NOTE — Telephone Encounter (Signed)
Pt called on 10/03 c/o 3 days of loose stool as though she had C dif Pls order stool for c/s, and C dif, also if reports excess diarrhea and light headedness, order stat chem 7  pls

## 2015-10-01 NOTE — Telephone Encounter (Signed)
noted 

## 2015-10-03 ENCOUNTER — Other Ambulatory Visit: Payer: Medicare Other

## 2015-10-15 ENCOUNTER — Ambulatory Visit
Admission: RE | Admit: 2015-10-15 | Discharge: 2015-10-15 | Disposition: A | Discharge status: Home / Self Care | Payer: Medicare Other | Source: Ambulatory Visit | Attending: Family Medicine | Admitting: Family Medicine

## 2015-10-15 ENCOUNTER — Other Ambulatory Visit: Payer: Self-pay | Admitting: Family Medicine

## 2015-10-15 DIAGNOSIS — N644 Mastodynia: Secondary | ICD-10-CM

## 2015-10-15 DIAGNOSIS — R234 Changes in skin texture: Secondary | ICD-10-CM

## 2015-10-15 DIAGNOSIS — Z853 Personal history of malignant neoplasm of breast: Secondary | ICD-10-CM

## 2015-10-15 DIAGNOSIS — Z9012 Acquired absence of left breast and nipple: Secondary | ICD-10-CM

## 2015-10-15 DIAGNOSIS — T8543XA Leakage of breast prosthesis and implant, initial encounter: Secondary | ICD-10-CM

## 2015-10-18 ENCOUNTER — Other Ambulatory Visit (HOSPITAL_COMMUNITY): Payer: Medicare Other

## 2015-10-21 ENCOUNTER — Ambulatory Visit (HOSPITAL_COMMUNITY): Payer: Medicare Other | Admitting: Hematology & Oncology

## 2015-10-22 NOTE — Assessment & Plan Note (Addendum)
Secondary to malabsorption from multiple intestinal surgeries versus chronic GI blood loss.  She has a complicated GI history and this is followed at [facility].    Labs today: CBC diff, iron/TIBC, ferritin, CMET  She is not a candidate for cancer marker testing for screening purposes as it is not recommended for this reason.  Unfortunately, Dr. Barnet Glasgow performed a CEA and CA 27.29 in 2015.  She is educated and given information on the role of cancer marker tests.  Other than PSA in men, they are not used for screening purposes as they are not specific, yet sensitive which can lead to inappropriate further testing and increasing morbidity.  "I feel like I am due for another dose of IV iron."  If her ferritin is less than 100, then I will calculate her iron deficit and order dose appropriately.  If greater than 100, the she does not meet parameters for IV iron replacement.  Labs every 6 weeks as ordered: CBC diff, CMET, iron/TIBC, ferritin.  Return in 6 months for follow-up.

## 2015-10-22 NOTE — Progress Notes (Signed)
[name], MD [address] [place] [address]  Iron deficiency anemia - Plan: CBC with Differential, Comprehensive metabolic panel, Ferritin, Iron and TIBC  Low ferritin level - Plan: CBC with Differential, Comprehensive metabolic panel, Ferritin, Iron and TIBC  CURRENT THERAPY: IV iron replacement.  Oncology Flowsheet 06/27/2014 07/04/2014 07/18/2014 08/20/2014  ferric gluconate (NULECIT) IV      ferumoxytol [facility]) IV 510 mg 510 mg     Oncology Flowsheet 03/01/2015 06/10/2015 07/03/2015 07/09/2015  ferric gluconate (NULECIT) IV      ferumoxytol (FERAHEME) IV 510 mg   510 mg   Oncology Flowsheet 08/29/2015  ferric gluconate (NULECIT) IV 125 mg  ferumoxytol Shirlean Kelly) IV     INTERVAL HISTORY: Curstin Schmale 54 y.o. female returns for followup of iron deficiency anemia. AND history of Paget's disease of the left breast status post resection with breast reconstruction with a left sided TRAM flap. This was done at [facility] with her initial diagnosis of Paget's disease in 2006. AND  Medical noncompliance with multiple missed appointments and lab appointments   I personally reviewed and went over laboratory results with the patient.  The results are noted within this dictation.  Labs are updated today.  Her Hgb is improved further in the 14 g/dL range.  This is promising for a great response to iron replacement.  Her iron studies are pending at this time.  "I feel like I am due for IV iron again."  She notes that her fatigue is much improved.  She denies any complaints or symptoms of iron deficiency at this time.  She is educated that we will wait for results from iron studies before we pursue further IV iron replacement.  She is not a candidate for IV iron if her ferritin is greater than 100, particularly in the setting of a normal hemoglobin at the ULN.  However, if her ferritin is less than 100, I will calculate her iron deficit and replace  accordingly.  I personally reviewed and went over radiographic studies with the patient.  The results are noted within this dictation.  Mammogram on 10/15/15 is negative for malignancy and issues associated with her breast implant.  "When am I due for repeat cancer marker testing?  On chart review, I see that Dr. Barnet Glasgow performed CEA and CA 27.29 testing in 2015.  Unfortunately, these tests are not used for screening and are not indicated, despite the patient's concerns about her colectomy, inability to tolerate colonoscopy, and recurrent abdominal surgeries.  [employer] and other organizations have made it clear that cancer marker testing serves no purpose in the setting of screening, except PSA (which is controversial too).  I printed information for [place] to have regarding the complications associated with inappropriate cancer marker testing.  I decline to perform any today.  Otherwise, she is actively being followed by a pain clinic who is managing her back pain.  She reports that she is scheduled for another injection to help with her back pain on 11/9.     Past Medical History  Diagnosis Date  . SVT (supraventricular tachycardia) ([place])   . Hypercholesterolemia   . Bilateral ovarian cysts   . Perforation bowel ([place])   . PE (pulmonary embolism)   . Thyroid disease   . GERD (gastroesophageal reflux disease)   . Sepsis(995.91)   . E coli infection   . Anxiety   . Pneumonia   . Depression   . Carpal tunnel syndrome   .  DVT (deep venous thrombosis) ([place])   . Anemia   . Irregular heartbeat   . Stroke ([place])   . DVT (deep venous thrombosis) ([place])   . Blood transfusion without reported diagnosis   . Clotting disorder ([place])   . Fibromyalgia   . Hx of migraines   . Interstitial cystitis   . Acute renal failure ([place])     renal faliure with surgery   . Cancer [facility])     left breast   . Medically noncompliant 02/28/2015  . H/O bilateral salpingo-oophorectomy 02/28/2015    has Paget's disease  of female breast ([place]); Hypothyroidism; Hyperlipidemia LDL goal <100; Obesity; Iron deficiency anemia; Anxiety state; CARPAL TUNNEL SYNDROME, RIGHT; OVARIAN CYST; Backache; FIBROMYALGIA; Tachycardia; SMALL BOWEL OBSTRUCTION, HX OF; INTERSTITIAL CYSTITIS; Metabolic syndrome X; Postsurgical menopause; Insomnia; Fatigue; Headache disorder; Morbid obesity ([place]); GERD (gastroesophageal reflux disease); Seasonal allergies; Irregular heart rate; Low ferritin level; Nausea alone; Back pain with radiation; Vitamin D deficiency; Dysuria; and Need for prophylactic vaccination and inoculation against influenza on her problem list.     is allergic to bisacodyl; clarithromycin; clarithromycin; clindamycin hcl; codeine; monosodium glutamate; nitrofurantoin; scopolamine hbr; and tape.  Current Outpatient Prescriptions on File Prior to Visit  Medication Sig Dispense Refill  . ALPRAZolam (XANAX) 1 MG tablet Take 1 tablet (1 mg total) by mouth 2 (two) times daily as needed for anxiety. 60 tablet 4  . butalbital-acetaminophen-caffeine (FIORICET, ESGIC) 50-325-40 MG per tablet TAKE (1) TABLET BY MOUTH TWICE A DAY AS NEEDED FOR MIGRAINES. 14 tablet 2  . conjugated estrogens (PREMARIN) vaginal cream Place 4.035 Applicatorfuls vaginally at bedtime.    . cyclobenzaprine (FLEXERIL) 10 MG tablet Take 1 tablet (10 mg total) by mouth 2 (two) times daily as needed for muscle spasms. 180 tablet 0  . DULoxetine (CYMBALTA) 30 MG capsule Take 1 capsule (30 mg total) by mouth 2 (two) times daily. 180 capsule 0  . Evening Primrose Oil 500 MG CAPS Take 1,000 mg by mouth at bedtime.    . fenofibrate (TRICOR) 145 MG tablet Take 1 tablet (145 mg total) by mouth at bedtime. 90 tablet 1  . Glycerin, Laxative, 80.7 % SUPP Place 1 suppository rectally at bedtime as needed (for constipation).     . hydrocortisone cream 1 % Apply 1 application topically daily as needed for itching (for irritation).     Marland Kitchen levothyroxine (SYNTHROID, LEVOTHROID) 125  MCG tablet Take 1 tablet (125 mcg total) by mouth daily before breakfast. 30 tablet 0  . magnesium oxide (MAG-OX) 400 MG tablet Take 400 mg by mouth 2 (two) times daily.    . Multiple Vitamins-Minerals (ICAPS) CAPS Take 1 capsule by mouth at bedtime.     . pantoprazole (PROTONIX) 40 MG tablet Take 1 tablet (40 mg total) by mouth daily. 90 tablet 1  . polyethylene glycol (MIRALAX / GLYCOLAX) packet Take 17 g by mouth daily as needed for mild constipation or moderate constipation.     . pravastatin (PRAVACHOL) 80 MG tablet Take 1 tablet (80 mg total) by mouth at bedtime. 90 tablet 1  . promethazine (PHENERGAN) 25 MG tablet Take 25 mg by mouth every 8 (eight) hours as needed for nausea or vomiting.     . senna-docusate (SENOKOT-S) 8.6-50 MG per tablet Take 2 tablets by mouth 3 (three) times daily as needed. constipation    . verapamil (CALAN) 120 MG tablet Take 120 mg by mouth daily as needed (for SVT).     . vitamin C (ASCORBIC ACID) 500  MG tablet Take 500 mg by mouth daily.     . Vitamin D, Ergocalciferol, (DRISDOL) 50000 UNITS CAPS capsule Take 1 capsule (50,000 Units total) by mouth every 7 (seven) days. (Patient taking differently: Take 50,000 Units by mouth every Wednesday. ) 4 capsule 5  . esomeprazole (NEXIUM) 40 MG capsule Take 1 capsule (40 mg total) by mouth 2 (two) times daily before a meal. (Patient not taking: Reported on 10/23/2015) 180 capsule 1  . ondansetron (ZOFRAN) 4 MG tablet Take 4 mg by mouth every 8 (eight) hours as needed for nausea or vomiting.     No current facility-administered medications on file prior to visit.    Past Surgical History  Procedure Laterality Date  . Heart ablation    . Vena cava filter placement    . Abdominal hysterectomy    . Hernia repair    . Bowel resection    . Carpal tunnel release    . Bowel fistula    . Breast enhancement surgery  1983  . Removal of breast implants  1993  . Cesarean section  1986  . Colon surgery    . Cosmetic surgery     . Enterocutaneous fistula closure N/A 08/09/2013    [facility], Dr Dossie Der  . Mastectomy    . Breast surgery    . Appendectomy    . Cervical spine surgery    . Fracture surgery      arm right (as a child)   . Back surgery      Denies any headaches, dizziness, double vision, fevers, chills, night sweats, nausea, vomiting, diarrhea, constipation, chest pain, heart palpitations, shortness of breath, blood in stool, black tarry stool, urinary pain, urinary burning, urinary frequency, hematuria.   PHYSICAL EXAMINATION  ECOG PERFORMANCE STATUS: 1 - Symptomatic but completely ambulatory  Filed Vitals:   10/23/15 1326  BP: 112/74  Pulse: 90  Temp: 98.1 F (36.7 C)  Resp: 18    GENERAL:alert, no distress, well nourished, well developed, comfortable, cooperative, obese, smiling and unaccompanied today. SKIN: skin color, texture, turgor are normal, no rashes or significant lesions HEAD: Normocephalic, No masses, lesions, tenderness or abnormalities EYES: normal, PERRLA, EOMI, Conjunctiva are pink and non-injected EARS: External ears normal OROPHARYNX:lips, buccal mucosa, and tongue normal and mucous membranes are moist  NECK: supple, trachea midline LYMPH:  not examined BREAST:not examined LUNGS: clear to auscultation  HEART: regular rate & rhythm ABDOMEN:abdomen soft, obese and normal bowel sounds BACK: Back symmetric, no curvature. EXTREMITIES:less then 2 second capillary refill, no joint deformities, effusion, or inflammation, no skin discoloration, no cyanosis  NEURO: alert & oriented x 3 with fluent speech, no focal motor/sensory deficits, gait normal   LABORATORY DATA: CBC    Component Value Date/Time   WBC 6.9 10/23/2015 1345   RBC 4.65 10/23/2015 1345   RBC 4.65 06/26/2014 1449   HGB 14.3 10/23/2015 1345   HCT 42.2 10/23/2015 1345   PLT 173 10/23/2015 1345   MCV 90.8 10/23/2015 1345   MCH 30.8 10/23/2015 1345   MCHC 33.9 10/23/2015 1345   RDW 13.2 10/23/2015 1345    LYMPHSABS 2.7 10/23/2015 1345   MONOABS 0.4 10/23/2015 1345   EOSABS 0.1 10/23/2015 1345   BASOSABS 0.0 10/23/2015 1345      Chemistry      Component Value Date/Time   NA 139 10/23/2015 1345   K 3.6 10/23/2015 1345   CL 103 10/23/2015 1345   CO2 26 10/23/2015 1345   BUN 26* 10/23/2015 1345  CREATININE 1.03* 10/23/2015 1345   CREATININE 0.76 02/25/2015 1432      Component Value Date/Time   CALCIUM 9.5 10/23/2015 1345   ALKPHOS 66 10/23/2015 1345   AST 32 10/23/2015 1345   ALT 34 10/23/2015 1345   BILITOT 0.7 10/23/2015 1345      Lab Results  Component Value Date   IRON 123 08/13/2015   TIBC 406 08/13/2015   FERRITIN 85 08/13/2015     PENDING LABS:   RADIOGRAPHIC STUDIES:  US Breast Ltd Uni Right Inc Axilla  10/15/2015  CLINICAL DATA:  54 year old female with history of left mastectomy in 2006 status post tram flap reconstruction for Paget's disease and a retropectoral saline implant of the right breast. She feels that the right breast sags more than it did previously, and she is concerned about a leak. The patient also describes intermittent right subareolar tingling sensation. EXAM: DIGITAL DIAGNOSTIC RIGHT MAMMOGRAM WITH IMPLANTS, 3D TOMOSYNTHESIS WITH CAD The patient has retropectoral implants. Standard and implant displaced views were performed. RIGHT BREAST ULTRASOUND COMPARISON:  Previous exam(s). ACR Breast Density Category b: There are scattered areas of fibroglandular density. FINDINGS: The patient is status post left mastectomy. There is a retropectoral right saline implant which appears intact. No suspicious masses, calcifications or areas of distortion are seen in the right breast. Mammographic images were processed with CAD. Physical exam of the subareolar right breast demonstrates no discrete palpable abnormalities. No rashes or discoloration is seen around the nipple. Ultrasound targeted to the subareolar right breast demonstrates normal retroareolar  fibroglandular tissue. No suspicious masses or areas of shadowing are identified. IMPRESSION: 1. No evidence of malignancy in the right breast. 2. No mammographic or targeted sonographic correlate for the subareolar tingling sensation. 3.  The right saline implant appears intact. RECOMMENDATION: 1.  Screening mammogram in one year.(Code:SM-B-01Y) 2. Clinical follow-up is recommended for the right subareolar a tingling sensation. Any further workup should be based on clinical grounds. I have discussed the findings and recommendations with the patient. Results were also provided in writing at the conclusion of the visit. If applicable, a reminder letter will be sent to the patient regarding the next appointment. BI-RADS CATEGORY  2: Benign. Electronically Signed   By: Ammie Ferrier M.D.   On: 10/15/2015 14:23   Mm Diag Breast W/implant Tomo Uni R  10/15/2015  CLINICAL DATA:  54 year old female with history of left mastectomy in 2006 status post tram flap reconstruction for Paget's disease and a retropectoral saline implant of the right breast. She feels that the right breast sags more than it did previously, and she is concerned about a leak. The patient also describes intermittent right subareolar tingling sensation. EXAM: DIGITAL DIAGNOSTIC RIGHT MAMMOGRAM WITH IMPLANTS, 3D TOMOSYNTHESIS WITH CAD The patient has retropectoral implants. Standard and implant displaced views were performed. RIGHT BREAST ULTRASOUND COMPARISON:  Previous exam(s). ACR Breast Density Category b: There are scattered areas of fibroglandular density. FINDINGS: The patient is status post left mastectomy. There is a retropectoral right saline implant which appears intact. No suspicious masses, calcifications or areas of distortion are seen in the right breast. Mammographic images were processed with CAD. Physical exam of the subareolar right breast demonstrates no discrete palpable abnormalities. No rashes or discoloration is seen  around the nipple. Ultrasound targeted to the subareolar right breast demonstrates normal retroareolar fibroglandular tissue. No suspicious masses or areas of shadowing are identified. IMPRESSION: 1. No evidence of malignancy in the right breast. 2. No mammographic or targeted sonographic correlate for  the subareolar tingling sensation. 3.  The right saline implant appears intact. RECOMMENDATION: 1.  Screening mammogram in one year.(Code:SM-B-01Y) 2. Clinical follow-up is recommended for the right subareolar a tingling sensation. Any further workup should be based on clinical grounds. I have discussed the findings and recommendations with the patient. Results were also provided in writing at the conclusion of the visit. If applicable, a reminder letter will be sent to the patient regarding the next appointment. BI-RADS CATEGORY  2: Benign. Electronically Signed   By: Ammie Ferrier M.D.   On: 10/15/2015 14:23     PATHOLOGY:    ASSESSMENT AND PLAN:  Iron deficiency anemia Secondary to malabsorption from multiple intestinal surgeries versus chronic GI blood loss.  She has a complicated GI history and this is followed at [facility].    Labs today: CBC diff, iron/TIBC, ferritin, CMET  She is not a candidate for cancer marker testing for screening purposes as it is not recommended for this reason.  Unfortunately, Dr. Barnet Glasgow performed a CEA and CA 27.29 in 2015.  She is educated and given information on the role of cancer marker tests.  Other than PSA in men, they are not used for screening purposes as they are not specific, yet sensitive which can lead to inappropriate further testing and increasing morbidity.  "I feel like I am due for another dose of IV iron."  If her ferritin is less than 100, then I will calculate her iron deficit and order dose appropriately.  If greater than 100, the she does not meet parameters for IV iron replacement.  Labs every 6 weeks as ordered: CBC diff, CMET,  iron/TIBC, ferritin.  Return in 6 months for follow-up.     THERAPY PLAN:  Continue to monitor counts and iron studies with replacement with IV iron when indicated.  All questions were answered. The patient knows to call the clinic with any problems, questions or concerns. We can certainly see the patient much sooner if necessary.  Patient and plan discussed with Dr. Ancil Linsey and she is in agreement with the aforementioned.   This note is electronically signed by: Doy Mince 10/23/2015 4:24 PM

## 2015-10-23 ENCOUNTER — Encounter (HOSPITAL_COMMUNITY): Payer: Medicare Other | Attending: Oncology | Admitting: Oncology

## 2015-10-23 ENCOUNTER — Encounter (HOSPITAL_COMMUNITY): Payer: Self-pay | Admitting: Oncology

## 2015-10-23 VITALS — BP 112/74 | HR 90 | Temp 98.1°F | Resp 18 | Wt 225.3 lb

## 2015-10-23 DIAGNOSIS — M545 Low back pain: Secondary | ICD-10-CM | POA: Diagnosis not present

## 2015-10-23 DIAGNOSIS — D509 Iron deficiency anemia, unspecified: Secondary | ICD-10-CM | POA: Diagnosis present

## 2015-10-23 DIAGNOSIS — R790 Abnormal level of blood mineral: Secondary | ICD-10-CM

## 2015-10-23 LAB — COMPREHENSIVE METABOLIC PANEL
ALBUMIN: 4.4 g/dL (ref 3.5–5.0)
ALK PHOS: 66 U/L (ref 38–126)
ALT: 34 U/L (ref 14–54)
ANION GAP: 10 (ref 5–15)
AST: 32 U/L (ref 15–41)
BUN: 26 mg/dL — ABNORMAL HIGH (ref 6–20)
CALCIUM: 9.5 mg/dL (ref 8.9–10.3)
CHLORIDE: 103 mmol/L (ref 101–111)
CO2: 26 mmol/L (ref 22–32)
CREATININE: 1.03 mg/dL — AB (ref 0.44–1.00)
GFR calc Af Amer: >60 mL/min (ref >60)
GFR calc non Af Amer: >60 mL/min (ref >60)
GLUCOSE: 113 mg/dL — AB (ref 65–99)
Potassium: 3.6 mmol/L (ref 3.5–5.1)
SODIUM: 139 mmol/L (ref 135–145)
Total Bilirubin: 0.7 mg/dL (ref 0.3–1.2)
Total Protein: 7.3 g/dL (ref 6.5–8.1)

## 2015-10-23 LAB — CBC WITH DIFFERENTIAL/PLATELET
BASOS ABS: 0 10*3/uL (ref 0.0–0.1)
BASOS PCT: 0 %
EOS ABS: 0.1 10*3/uL (ref 0.0–0.7)
Eosinophils Relative: 2 %
HCT: 42.2 % (ref 36.0–46.0)
HEMOGLOBIN: 14.3 g/dL (ref 12.0–15.0)
Lymphocytes Relative: 40 %
Lymphs Abs: 2.7 10*3/uL (ref 0.7–4.0)
MCH: 30.8 pg (ref 26.0–34.0)
MCHC: 33.9 g/dL (ref 30.0–36.0)
MCV: 90.8 fL (ref 78.0–100.0)
Monocytes Absolute: 0.4 10*3/uL (ref 0.1–1.0)
Monocytes Relative: 6 %
NEUTROS PCT: 52 %
Neutro Abs: 3.6 10*3/uL (ref 1.7–7.7)
PLATELETS: 173 10*3/uL (ref 150–400)
RBC: 4.65 MIL/uL (ref 3.87–5.11)
RDW: 13.2 % (ref 11.5–15.5)
WBC: 6.9 10*3/uL (ref 4.0–10.5)

## 2015-10-23 LAB — IRON AND TIBC
Iron: 108 ug/dL (ref 28–170)
Saturation Ratios: 24 % (ref 10.4–31.8)
TIBC: 454 ug/dL — AB (ref 250–450)
UIBC: 346 ug/dL

## 2015-10-23 LAB — FERRITIN: Ferritin: 91 ng/mL (ref 11–307)

## 2015-10-23 NOTE — Progress Notes (Signed)
..  [name] reason for visit today are for labs as scheduled per MD orders.  Venipuncture performed with a 23 gauge butterfly needle to R Antecubital.  [name] tolerated venipuncture well and without incident; questions were answered and patient was discharged.

## 2015-10-23 NOTE — Patient Instructions (Signed)
..  [place] at [facility] Discharge Instructions  RECOMMENDATIONS MADE BY THE CONSULTANT AND ANY TEST RESULTS WILL BE SENT TO YOUR REFERRING PHYSICIAN.  Exam today per T. Kefalas PA-C We will call if iron infusion needed Return every 6 weeks for labs Return in 6 months  Thank you for choosing [place] at [facility] to provide your oncology and hematology care.  To afford each patient quality time with our provider, please arrive at least 15 minutes before your scheduled appointment time.    You need to re-schedule your appointment should you arrive 10 or more minutes late.  We strive to give you quality time with our providers, and arriving late affects you and other patients whose appointments are after yours.  Also, if you no show three or more times for appointments you may be dismissed from the clinic at the providers discretion.     Again, thank you for choosing [facility].  Our hope is that these requests will decrease the amount of time that you wait before being seen by our physicians.       _____________________________________________________________  Should you have questions after your visit to [facility], please contact our office at [phone] between the hours of 8:30 a.m. and 4:30 p.m.  Voicemails left after 4:30 p.m. will not be returned until the following business day.  For prescription refill requests, have your pharmacy contact our office.

## 2015-10-24 ENCOUNTER — Other Ambulatory Visit (HOSPITAL_COMMUNITY): Payer: Self-pay

## 2015-10-24 ENCOUNTER — Other Ambulatory Visit (HOSPITAL_COMMUNITY): Payer: Self-pay | Admitting: Oncology

## 2015-10-24 DIAGNOSIS — D509 Iron deficiency anemia, unspecified: Secondary | ICD-10-CM

## 2015-10-29 ENCOUNTER — Encounter: Payer: Self-pay | Admitting: *Deleted

## 2015-10-29 ENCOUNTER — Other Ambulatory Visit: Payer: Self-pay | Admitting: Family Medicine

## 2015-10-29 ENCOUNTER — Encounter: Payer: Self-pay | Admitting: Cardiovascular Disease

## 2015-10-29 ENCOUNTER — Telehealth: Payer: Self-pay | Admitting: Family Medicine

## 2015-10-29 DIAGNOSIS — R197 Diarrhea, unspecified: Secondary | ICD-10-CM

## 2015-10-29 MED ORDER — HYDROCORTISONE ACETATE 10 % RE FOAM: 1.0000 | Freq: Two times a day (BID) | RECTAL | Status: DC

## 2015-10-29 MED ORDER — HYDROCORTISONE ACE-PRAMOXINE 1-1 % RE FOAM: 1.0000 | Freq: Three times a day (TID) | RECTAL | Status: DC

## 2015-10-29 NOTE — Telephone Encounter (Signed)
Proctofoam not covered.  Proctocort sent in replacement.

## 2015-10-29 NOTE — Telephone Encounter (Signed)
Patient is asking if she can go to the lab to check for C-diff, she has had diarrhea x's 2wks, she is also asking for a numbing suppository or something for pain from hemorrhoids please advise?

## 2015-10-29 NOTE — Addendum Note (Signed)
Addended by: Denman George B on: 10/29/2015 04:37 PM   Modules accepted: Orders, Medications

## 2015-10-29 NOTE — Telephone Encounter (Signed)
Please advise 

## 2015-10-29 NOTE — Telephone Encounter (Signed)
pls order test, pls check with pharmacy for availability of product, prescribe for 3 times daily as needed x 5 days   ?? pls ask

## 2015-10-29 NOTE — Telephone Encounter (Signed)
Spoke with patient and she is aware of orders.  Order for Proctofoam entered and sent to pharmacy.    Husband in to collect specimen supplies.  Made aware that insurance may not cover medication.

## 2015-10-29 NOTE — Addendum Note (Signed)
Addended by: Denman George B on: 10/29/2015 03:55 PM   Modules accepted: Orders

## 2015-10-31 ENCOUNTER — Encounter (HOSPITAL_COMMUNITY): Payer: Self-pay

## 2015-10-31 ENCOUNTER — Encounter (HOSPITAL_COMMUNITY): Payer: Medicare Other | Attending: Hematology & Oncology

## 2015-10-31 ENCOUNTER — Other Ambulatory Visit: Payer: Self-pay | Admitting: Family Medicine

## 2015-10-31 VITALS — BP 106/61 | HR 91 | Temp 97.9°F | Resp 18

## 2015-10-31 DIAGNOSIS — D509 Iron deficiency anemia, unspecified: Secondary | ICD-10-CM | POA: Diagnosis not present

## 2015-10-31 LAB — C. DIFFICILE GDH AND TOXIN A/B
C. DIFF TOXIN A/B: NOT DETECTED
C. DIFFICILE GDH: DETECTED — AB

## 2015-10-31 LAB — CLOSTRIDIUM DIFFICILE BY PCR: CDIFFPCR: DETECTED — AB

## 2015-10-31 MED ORDER — SODIUM CHLORIDE 0.9 % IV SOLN
125.0000 mg | Freq: Once | INTRAVENOUS | Status: AC
  Administered 2015-10-31: 125 mg via INTRAVENOUS
  Filled 2015-10-31: qty 10

## 2015-10-31 MED ORDER — METRONIDAZOLE 500 MG PO TABS: 500.0000 mg | ORAL_TABLET | Freq: Three times a day (TID) | ORAL | Status: DC

## 2015-10-31 MED ORDER — SODIUM CHLORIDE 0.9 % IV SOLN
INTRAVENOUS | Status: DC
  Administered 2015-10-31: 14:00:00 via INTRAVENOUS

## 2015-10-31 MED ORDER — FLUCONAZOLE 150 MG PO TABS: 150.0000 mg | ORAL_TABLET | Freq: Once | ORAL | Status: DC

## 2015-10-31 NOTE — Addendum Note (Signed)
Addended by: Denman George B on: 10/31/2015 04:51 PM   Modules accepted: Orders

## 2015-10-31 NOTE — Progress Notes (Signed)
1535:  Tolerated infusion w/o adverse reaction.  VSS.  A&Ox4; in no distress.  Discharged ambulatory.

## 2015-10-31 NOTE — Patient Instructions (Signed)
[place] at [facility] Discharge Instructions  RECOMMENDATIONS MADE BY THE CONSULTANT AND ANY TEST RESULTS WILL BE SENT TO YOUR REFERRING PHYSICIAN.  Iron infusion today. Return as scheduled for lab work. Return as scheduled for office visit.   Thank you for choosing [place] at [facility] to provide your oncology and hematology care.  To afford each patient quality time with our provider, please arrive at least 15 minutes before your scheduled appointment time.    You need to re-schedule your appointment should you arrive 10 or more minutes late.  We strive to give you quality time with our providers, and arriving late affects you and other patients whose appointments are after yours.  Also, if you no show three or more times for appointments you may be dismissed from the clinic at the providers discretion.     Again, thank you for choosing [facility].  Our hope is that these requests will decrease the amount of time that you wait before being seen by our physicians.       _____________________________________________________________  Should you have questions after your visit to [facility], please contact our office at [phone] between the hours of 8:30 a.m. and 4:30 p.m.  Voicemails left after 4:30 p.m. will not be returned until the following business day.  For prescription refill requests, have your pharmacy contact our office.

## 2015-11-04 ENCOUNTER — Telehealth: Payer: Self-pay

## 2015-11-05 NOTE — Telephone Encounter (Signed)
I recommend GI involvement, the C diff symptoms are primarily GI, and since  this is a more complicated course, the sooner GI is involved the better esp as she has had significant intestinal surgery, explain to her and refer pls

## 2015-11-06 ENCOUNTER — Ambulatory Visit (HOSPITAL_COMMUNITY): Payer: Medicare Other

## 2015-11-06 ENCOUNTER — Other Ambulatory Visit: Payer: Self-pay

## 2015-11-06 MED ORDER — ESOMEPRAZOLE MAGNESIUM 40 MG PO CPDR: 40.0000 mg | DELAYED_RELEASE_CAPSULE | Freq: Two times a day (BID) | ORAL | Status: DC

## 2015-11-06 NOTE — Telephone Encounter (Signed)
Spoke with patient and she states that she did get in contact with her pain management and was given extra medicine.  She is pushing fluids to prevent dehydration.   She will call back with any further problems.   She is asking that her nexium be PA'd.  Will fill out forms.

## 2015-11-07 ENCOUNTER — Ambulatory Visit (HOSPITAL_COMMUNITY): Payer: Medicare Other

## 2015-11-08 ENCOUNTER — Telehealth: Payer: Self-pay | Admitting: Family Medicine

## 2015-11-08 NOTE — Telephone Encounter (Signed)
Patient is stating that Dr Domingo Cocking (Headache Dr.) wants her to pay out of pocket this time for the Botox they will order it under her insurance under Plan D, that will put her from $400 to $1000 every 3 wks, plus a $50 copay for trigger injections every 2 wks until January.  [name] is asking to be sent to a different Headache Dr. Please advise? She is also asking if she can get a refill on butalbital-acetaminophen-caffeine (FIORICET, ESGIC) 50-325-40 MG per tablet for her headaches right now that she is currently having everyday, please advise?

## 2015-11-11 ENCOUNTER — Other Ambulatory Visit: Payer: Self-pay

## 2015-11-11 MED ORDER — BUTALBITAL-APAP-CAFFEINE 50-325-40 MG PO TABS: ORAL_TABLET | ORAL | Status: DC

## 2015-11-11 NOTE — Telephone Encounter (Signed)
Has she tried to explain to her ins or the neurologist the cost concerns. Not sure that cost will be different anywhere else. Askl her to provide another facility which will have better coverage per her insurance plan, I am happy to refer once we are clear on this. [name] are unable to do that research, as long as the Provider is in network, cost of specific services we cannot drive down on   ??? pls ask

## 2015-11-11 NOTE — Telephone Encounter (Signed)
Medication refilled.   Please advise on referral.

## 2015-11-13 ENCOUNTER — Telehealth (HOSPITAL_COMMUNITY): Payer: Self-pay | Admitting: *Deleted

## 2015-11-13 NOTE — Telephone Encounter (Signed)
Called and left message for patient to return call.  

## 2015-11-13 NOTE — Telephone Encounter (Signed)
Left message on voicemail in reference to upcoming appointment scheduled for 11/18/15. Phone number given for a call back so details instructions can be given. Hubbard Robinson, RN

## 2015-11-14 ENCOUNTER — Other Ambulatory Visit: Payer: Self-pay

## 2015-11-14 ENCOUNTER — Telehealth (HOSPITAL_COMMUNITY): Payer: Self-pay

## 2015-11-14 DIAGNOSIS — R790 Abnormal level of blood mineral: Secondary | ICD-10-CM

## 2015-11-14 DIAGNOSIS — D509 Iron deficiency anemia, unspecified: Secondary | ICD-10-CM

## 2015-11-14 MED ORDER — PRAVASTATIN SODIUM 80 MG PO TABS: 80.0000 mg | ORAL_TABLET | Freq: Every day | ORAL | Status: DC

## 2015-11-14 MED ORDER — DULOXETINE HCL 30 MG PO CPEP: 30.0000 mg | ORAL_CAPSULE | Freq: Two times a day (BID) | ORAL | Status: DC

## 2015-11-14 MED ORDER — CYCLOBENZAPRINE HCL 10 MG PO TABS: 10.0000 mg | ORAL_TABLET | Freq: Two times a day (BID) | ORAL | Status: DC | PRN

## 2015-11-14 NOTE — Telephone Encounter (Signed)
Patient given detailed instructions per Myocardial Perfusion Study Information Sheet for the test on 11-18-2015 at 1245. Patient notified to arrive 15 minutes early and that it is imperative to arrive on time for appointment to keep from having the test rescheduled.  If you need to cancel or reschedule your appointment, please call the office within 24 hours of your appointment. Failure to do so may result in a cancellation of your appointment, and a $50 no show fee. Patient verbalized understanding.Oletta Lamas, Siah Kannan A

## 2015-11-18 ENCOUNTER — Ambulatory Visit (HOSPITAL_COMMUNITY): Payer: Medicare Other | Attending: Cardiology

## 2015-11-18 ENCOUNTER — Ambulatory Visit (HOSPITAL_COMMUNITY): Payer: Medicare Other

## 2015-11-18 DIAGNOSIS — R0609 Other forms of dyspnea: Secondary | ICD-10-CM | POA: Insufficient documentation

## 2015-11-18 DIAGNOSIS — R55 Syncope and collapse: Secondary | ICD-10-CM | POA: Insufficient documentation

## 2015-11-18 DIAGNOSIS — R079 Chest pain, unspecified: Secondary | ICD-10-CM

## 2015-11-18 DIAGNOSIS — R002 Palpitations: Secondary | ICD-10-CM | POA: Diagnosis not present

## 2015-11-18 DIAGNOSIS — R0602 Shortness of breath: Secondary | ICD-10-CM | POA: Diagnosis not present

## 2015-11-18 MED ORDER — TECHNETIUM TC 99M SESTAMIBI GENERIC - CARDIOLITE
32.7000 | Freq: Once | INTRAVENOUS | Status: AC | PRN
  Administered 2015-11-18: 32.7 via INTRAVENOUS

## 2015-11-18 MED ORDER — REGADENOSON 0.4 MG/5ML IV SOLN
0.4000 mg | Freq: Once | INTRAVENOUS | Status: AC
  Administered 2015-11-18: 0.4 mg via INTRAVENOUS

## 2015-11-19 ENCOUNTER — Ambulatory Visit (HOSPITAL_COMMUNITY): Payer: Medicare Other | Attending: Internal Medicine

## 2015-11-19 ENCOUNTER — Ambulatory Visit (HOSPITAL_COMMUNITY): Payer: Self-pay

## 2015-11-19 DIAGNOSIS — R0989 Other specified symptoms and signs involving the circulatory and respiratory systems: Secondary | ICD-10-CM

## 2015-11-19 LAB — MYOCARDIAL PERFUSION IMAGING
CHL CUP NUCLEAR SDS: 7
CHL CUP RESTING HR STRESS: 89 {beats}/min
LHR: 0.41
LV dias vol: 50 mL
LVSYSVOL: 8 mL
Peak HR: 111 {beats}/min
SRS: 5
SSS: 12
TID: 1.12

## 2015-11-19 MED ORDER — TECHNETIUM TC 99M SESTAMIBI GENERIC - CARDIOLITE
32.8000 | Freq: Once | INTRAVENOUS | Status: AC | PRN
  Administered 2015-11-19: 32.8 via INTRAVENOUS

## 2015-11-25 ENCOUNTER — Telehealth: Payer: Self-pay | Admitting: Internal Medicine

## 2015-11-25 NOTE — Telephone Encounter (Signed)
New message  Pt called to follow up on test results. Please call

## 2015-11-25 NOTE — Telephone Encounter (Signed)
I spoke with the patient and made her aware of her myoview results. She was concerned that she saw on MyChart that her EF is hyperdynamic. She wondered if this was the cause of her chest pain. I advised her most likely not, but she is requesting an echo to confirm her EF. She is having chest discomfort intermittently on an almost daily basis. I advised her I would review with Dr. Caryl Comes, but this will be next week before I can get back with her due to the doctor being out of the office all week. She is agreeable.

## 2015-12-02 ENCOUNTER — Emergency Department (HOSPITAL_COMMUNITY): Payer: Medicare Other

## 2015-12-02 ENCOUNTER — Encounter (HOSPITAL_COMMUNITY): Payer: Self-pay | Admitting: *Deleted

## 2015-12-02 ENCOUNTER — Emergency Department (HOSPITAL_COMMUNITY)
Admission: EM | Admit: 2015-12-02 | Discharge: 2015-12-02 | Disposition: A | Discharge status: Home / Self Care | Payer: Medicare Other | Attending: Emergency Medicine | Admitting: Emergency Medicine

## 2015-12-02 DIAGNOSIS — K219 Gastro-esophageal reflux disease without esophagitis: Secondary | ICD-10-CM | POA: Diagnosis not present

## 2015-12-02 DIAGNOSIS — Z9119 Patient's noncompliance with other medical treatment and regimen: Secondary | ICD-10-CM | POA: Diagnosis not present

## 2015-12-02 DIAGNOSIS — R51 Headache: Secondary | ICD-10-CM | POA: Diagnosis not present

## 2015-12-02 DIAGNOSIS — R112 Nausea with vomiting, unspecified: Secondary | ICD-10-CM | POA: Insufficient documentation

## 2015-12-02 DIAGNOSIS — Z8701 Personal history of pneumonia (recurrent): Secondary | ICD-10-CM | POA: Insufficient documentation

## 2015-12-02 DIAGNOSIS — E079 Disorder of thyroid, unspecified: Secondary | ICD-10-CM | POA: Insufficient documentation

## 2015-12-02 DIAGNOSIS — F419 Anxiety disorder, unspecified: Secondary | ICD-10-CM | POA: Insufficient documentation

## 2015-12-02 DIAGNOSIS — F329 Major depressive disorder, single episode, unspecified: Secondary | ICD-10-CM | POA: Diagnosis not present

## 2015-12-02 DIAGNOSIS — M797 Fibromyalgia: Secondary | ICD-10-CM | POA: Insufficient documentation

## 2015-12-02 DIAGNOSIS — Z853 Personal history of malignant neoplasm of breast: Secondary | ICD-10-CM | POA: Diagnosis not present

## 2015-12-02 DIAGNOSIS — Z8673 Personal history of transient ischemic attack (TIA), and cerebral infarction without residual deficits: Secondary | ICD-10-CM | POA: Diagnosis not present

## 2015-12-02 DIAGNOSIS — Z86718 Personal history of other venous thrombosis and embolism: Secondary | ICD-10-CM | POA: Insufficient documentation

## 2015-12-02 DIAGNOSIS — Z79899 Other long term (current) drug therapy: Secondary | ICD-10-CM | POA: Insufficient documentation

## 2015-12-02 DIAGNOSIS — E7800 Pure hypercholesterolemia, unspecified: Secondary | ICD-10-CM | POA: Diagnosis not present

## 2015-12-02 DIAGNOSIS — Z8619 Personal history of other infectious and parasitic diseases: Secondary | ICD-10-CM | POA: Insufficient documentation

## 2015-12-02 DIAGNOSIS — Z86711 Personal history of pulmonary embolism: Secondary | ICD-10-CM | POA: Diagnosis not present

## 2015-12-02 DIAGNOSIS — Z862 Personal history of diseases of the blood and blood-forming organs and certain disorders involving the immune mechanism: Secondary | ICD-10-CM | POA: Insufficient documentation

## 2015-12-02 DIAGNOSIS — Z8742 Personal history of other diseases of the female genital tract: Secondary | ICD-10-CM | POA: Diagnosis not present

## 2015-12-02 DIAGNOSIS — R42 Dizziness and giddiness: Secondary | ICD-10-CM

## 2015-12-02 LAB — COMPREHENSIVE METABOLIC PANEL
ALBUMIN: 3.9 g/dL (ref 3.5–5.0)
ALT: 36 U/L (ref 14–54)
AST: 38 U/L (ref 15–41)
Alkaline Phosphatase: 55 U/L (ref 38–126)
Anion gap: 11 (ref 5–15)
BUN: 20 mg/dL (ref 6–20)
CHLORIDE: 107 mmol/L (ref 101–111)
CO2: 23 mmol/L (ref 22–32)
CREATININE: 0.83 mg/dL (ref 0.44–1.00)
Calcium: 9.2 mg/dL (ref 8.9–10.3)
GFR calc Af Amer: >60 mL/min (ref >60)
GFR calc non Af Amer: >60 mL/min (ref >60)
Glucose, Bld: 115 mg/dL — ABNORMAL HIGH (ref 65–99)
Potassium: 3.9 mmol/L (ref 3.5–5.1)
SODIUM: 141 mmol/L (ref 135–145)
Total Bilirubin: 0.6 mg/dL (ref 0.3–1.2)
Total Protein: 6.7 g/dL (ref 6.5–8.1)

## 2015-12-02 LAB — CBC WITH DIFFERENTIAL/PLATELET
BASOS ABS: 0 10*3/uL (ref 0.0–0.1)
BASOS PCT: 0 %
EOS ABS: 0.1 10*3/uL (ref 0.0–0.7)
EOS PCT: 1 %
HCT: 40.4 % (ref 36.0–46.0)
Hemoglobin: 13.7 g/dL (ref 12.0–15.0)
LYMPHS PCT: 28 %
Lymphs Abs: 1.8 10*3/uL (ref 0.7–4.0)
MCH: 30.8 pg (ref 26.0–34.0)
MCHC: 33.9 g/dL (ref 30.0–36.0)
MCV: 90.8 fL (ref 78.0–100.0)
Monocytes Absolute: 0.4 10*3/uL (ref 0.1–1.0)
Monocytes Relative: 5 %
Neutro Abs: 4.3 10*3/uL (ref 1.7–7.7)
Neutrophils Relative %: 66 %
PLATELETS: 162 10*3/uL (ref 150–400)
RBC: 4.45 MIL/uL (ref 3.87–5.11)
RDW: 13.1 % (ref 11.5–15.5)
WBC: 6.5 10*3/uL (ref 4.0–10.5)

## 2015-12-02 MED ORDER — LORAZEPAM 1 MG PO TABS: 1.0000 mg | ORAL_TABLET | Freq: Three times a day (TID) | ORAL | Status: DC | PRN

## 2015-12-02 MED ORDER — LORAZEPAM 2 MG/ML IJ SOLN
1.0000 mg | Freq: Once | INTRAMUSCULAR | Status: AC
  Administered 2015-12-02: 1 mg via INTRAVENOUS
  Filled 2015-12-02: qty 1

## 2015-12-02 MED ORDER — LEVOCETIRIZINE DIHYDROCHLORIDE 5 MG PO TABS: 5.0000 mg | ORAL_TABLET | Freq: Every evening | ORAL | Status: DC

## 2015-12-02 MED ORDER — SODIUM CHLORIDE 0.9 % IV BOLUS (SEPSIS)
1000.0000 mL | Freq: Once | INTRAVENOUS | Status: AC
  Administered 2015-12-02: 1000 mL via INTRAVENOUS

## 2015-12-02 MED ORDER — MECLIZINE HCL 25 MG PO TABS: 25.0000 mg | ORAL_TABLET | Freq: Three times a day (TID) | ORAL | Status: DC | PRN

## 2015-12-02 MED ORDER — MECLIZINE HCL 12.5 MG PO TABS
25.0000 mg | ORAL_TABLET | Freq: Once | ORAL | Status: AC
  Administered 2015-12-02: 25 mg via ORAL
  Filled 2015-12-02: qty 2

## 2015-12-02 NOTE — ED Provider Notes (Signed)
CSN: [id number]     Arrival date & time 12/02/15  1733 History   First MD Initiated Contact with Patient 12/02/15 1753     Chief Complaint  Patient presents with  . Dizziness  . Nausea     (Consider location/radiation/quality/duration/timing/severity/associated sxs/prior Treatment) Patient is a 54 y.o. [name] presenting with dizziness.  Dizziness Quality:  Room spinning Severity:  Severe Onset quality:  Sudden Timing:  Intermittent Chronicity:  New Context: not when bending over and not with bowel movement   Relieved by:  None tried Worsened by:  Standing up and turning head Ineffective treatments:  None tried Associated symptoms: headaches, nausea and vomiting     Past Medical History  Diagnosis Date  . SVT (supraventricular tachycardia) ([place])   . Hypercholesterolemia   . Bilateral ovarian cysts   . Perforation bowel ([place])   . PE (pulmonary embolism)   . Thyroid disease   . GERD (gastroesophageal reflux disease)   . Sepsis(995.91)   . E coli infection   . Anxiety   . Pneumonia   . Depression   . Carpal tunnel syndrome   . DVT (deep venous thrombosis) ([place])   . Anemia   . Irregular heartbeat   . Stroke ([place])   . DVT (deep venous thrombosis) ([place])   . Blood transfusion without reported diagnosis   . Clotting disorder ([place])   . Fibromyalgia   . Hx of migraines   . Interstitial cystitis   . Acute renal failure ([place])     renal faliure with surgery   . Cancer [facility])     left breast   . Medically noncompliant 02/28/2015  . H/O bilateral salpingo-oophorectomy 02/28/2015   Past Surgical History  Procedure Laterality Date  . Heart ablation    . Vena cava filter placement    . Abdominal hysterectomy    . Hernia repair    . Bowel resection    . Carpal tunnel release    . Bowel fistula    . Breast enhancement surgery  1983  . Removal of breast implants  1993  . Cesarean section  1986  . Colon surgery    . Cosmetic surgery    . Enterocutaneous fistula closure N/A  08/09/2013    [facility], Dr Dossie Der  . Mastectomy    . Breast surgery    . Appendectomy    . Cervical spine surgery    . Fracture surgery      arm right (as a child)   . Back surgery     Family History  Problem Relation Age of Onset  . Diabetes Mother   . Heart disease Mother   . Hypertension Mother   . Heart disease Father   . Hyperlipidemia Father   . Hypertension Father   . Cancer Maternal Aunt     colon  . Cancer Maternal Uncle     mets  . Cancer Maternal Grandfather     bone/mets  . Cancer Cousin 19    ovarian  . Cancer Maternal Uncle     prostate   Social History  Substance Use Topics  . Smoking status: Never Smoker   . Smokeless tobacco: Never Used  . Alcohol Use: No     Comment: rare   OB History    No data available     Review of Systems  Gastrointestinal: Positive for nausea and vomiting.  Neurological: Positive for dizziness and headaches. Negative for light-headedness.  All other systems reviewed and are negative.  Allergies  Bisacodyl; Clarithromycin; Clarithromycin; Clindamycin hcl; Codeine; Monosodium glutamate; Nitrofurantoin; and Scopolamine hbr  Home Medications   Prior to Admission medications   Medication Sig Start Date End Date Taking? Authorizing Provider  ALPRAZolam [name]) 1 MG tablet Take 1 tablet (1 mg total) by mouth 2 (two) times daily as needed for anxiety. 09/23/15  Yes Fayrene Helper, MD  conjugated estrogens (PREMARIN) vaginal cream Place 0.5 Applicatorfuls vaginally at bedtime.  04/23/15 04/22/16 Yes Historical Provider, MD  cyclobenzaprine (FLEXERIL) 10 MG tablet Take 1 tablet (10 mg total) by mouth 2 (two) times daily as needed for muscle spasms. 11/14/15  Yes Fayrene Helper, MD  DULoxetine (CYMBALTA) 30 MG capsule Take 1 capsule (30 mg total) by mouth 2 (two) times daily. 11/14/15 11/13/16 Yes Fayrene Helper, MD  esomeprazole (NEXIUM) 40 MG capsule Take 1 capsule (40 mg total) by mouth 2 (two) times daily before a  meal. 11/06/15  Yes Fayrene Helper, MD  fenofibrate (TRICOR) 145 MG tablet Take 1 tablet (145 mg total) by mouth at bedtime. 08/22/15  Yes Fayrene Helper, MD  Glycerin, Laxative, 80.7 % SUPP Place 1 suppository rectally at bedtime as needed (for constipation).  01/20/15  Yes Historical Provider, MD  HYDROmorphone (DILAUDID) 4 MG tablet Take 8 mg by mouth 2 (two) times daily. 11/16/15  Yes Historical Provider, MD  levothyroxine (SYNTHROID, LEVOTHROID) 125 MCG tablet Take 1 tablet (125 mcg total) by mouth daily before breakfast. 07/05/15  Yes Fayrene Helper, MD  magnesium oxide (MAG-OX) 400 MG tablet Take 400 mg by mouth daily.  04/23/15 04/22/16 Yes Historical Provider, MD  Multiple Vitamins-Minerals (HAIR/SKIN/NAILS PO) Take 1 tablet by mouth daily.   Yes Historical Provider, MD  Multiple Vitamins-Minerals (ICAPS) CAPS Take 1 capsule by mouth at bedtime.    Yes Historical Provider, MD  ondansetron (ZOFRAN) 4 MG tablet Take 4 mg by mouth every 8 (eight) hours as needed for nausea or vomiting.   Yes Historical Provider, MD  polyethylene glycol (MIRALAX / GLYCOLAX) packet Take 17 g by mouth daily as needed for mild constipation or moderate constipation.    Yes Historical Provider, MD  pravastatin (PRAVACHOL) 80 MG tablet Take 1 tablet (80 mg total) by mouth at bedtime. 11/14/15  Yes Fayrene Helper, MD  senna-docusate (SENOKOT-S) 8.6-50 MG per tablet Take 2 tablets by mouth 3 (three) times daily. constipation   Yes Historical Provider, MD  verapamil (CALAN) 120 MG tablet Take 120 mg by mouth daily as needed (for SVT).    Yes Historical Provider, MD  vitamin C (ASCORBIC ACID) 500 MG tablet Take 500 mg by mouth daily.    Yes Historical Provider, MD  Vitamin D, Ergocalciferol, (DRISDOL) 50000 UNITS CAPS capsule Take 1 capsule (50,000 Units total) by mouth every 7 (seven) days. Patient taking differently: Take 50,000 Units by mouth every Wednesday.  04/02/15  Yes Fayrene Helper, MD  hydrocortisone  cream 1 % Apply 1 application topically daily as needed for itching (for irritation).  12/14/14 12/14/15  Historical Provider, MD  levocetirizine (XYZAL) 5 MG tablet Take 1 tablet (5 mg total) by mouth every evening. 12/02/15   Merrily Pew, MD  LORazepam (ATIVAN) 1 MG tablet Take 1 tablet (1 mg total) by mouth 3 (three) times daily as needed (vertigo/nausea). 12/02/15   Merrily Pew, MD  meclizine (ANTIVERT) 25 MG tablet Take 1 tablet (25 mg total) by mouth 3 (three) times daily as needed for dizziness. 12/02/15   Merrily Pew, MD  promethazine (PHENERGAN) 25 MG tablet Take 25 mg by mouth every 8 (eight) hours as needed for nausea or vomiting.     Historical Provider, MD   BP 124/78 mmHg  Pulse 90  Temp(Src) 97.8 F (36.6 C) (Oral)  Resp 18  Ht 5' 4.5" (1.638 m)  Wt 210 lb (95.255 kg)  BMI 35.50 kg/m2  SpO2 94% Physical Exam  Constitutional: She is oriented to person, place, and time. She appears well-developed and well-nourished.  HENT:  Head: Normocephalic and atraumatic.  Neck: Normal range of motion.  Cardiovascular: Normal rate and regular rhythm.   Pulmonary/Chest: Effort normal. No stridor. No respiratory distress.  Abdominal: Soft. She exhibits no distension.  Musculoskeletal: Normal range of motion. She exhibits no edema or tenderness.  Neurological: She is alert and oriented to person, place, and time. No cranial nerve deficit. Coordination normal.  No altered mental status, able to give full seemingly accurate history.  Face is symmetric, EOM's intact, pupils equal and reactive, vision intact, tongue and uvula midline without deviation Upper and Lower extremity motor 5/5, intact pain perception in distal extremities, 2+ reflexes in biceps, patella and achilles tendons. Finger to nose normal, heel to shin normal.  Skin: Skin is warm and dry.  Nursing note and vitals reviewed.   ED Course  Procedures (including critical care time) Labs Review Labs Reviewed  COMPREHENSIVE  METABOLIC PANEL - Abnormal; Notable for the following:    Glucose, Bld 115 (*)    All other components within normal limits  CBC WITH DIFFERENTIAL/PLATELET    Imaging Review No results found. I have personally reviewed and evaluated these images and lab results as part of my medical decision-making.   EKG Interpretation None      MDM   Final diagnoses:  Vertigo   Suspect bppv, however has headache so will check CT. Meclizine and fluids for now, will assist in epley maneuvers when meds start to work.  CT ok. Symptoms improved after meds and epley maneuvers. Neuro unchanged, doubt central cause. Will dc w/ PCP fu.     Merrily Pew, MD 12/06/15 [phone]

## 2015-12-02 NOTE — ED Notes (Signed)
MD at bedside. 

## 2015-12-02 NOTE — ED Notes (Signed)
Pt brought in by [facility] c/o numbness to lips and hands, dizziness, nausea, headache x 3 days. Pt reports any movement causes dizziness and nausea. IV started by EMS, 20g rt a/c. EMS gave Zofran 4mg  IV during transport. Pt took Phenergan at home about 2 hours prior to arrival with no relief. BP 114/74, HR 70, O2 sat 94%. Recent C. Diff about 3 weeks ago and has now finished Flagyl.

## 2015-12-03 ENCOUNTER — Telehealth: Payer: Self-pay | Admitting: Family Medicine

## 2015-12-03 NOTE — Telephone Encounter (Signed)
pls call pt and inquire how she is doing as far as the vertigo is concerned. (Pls let her know that I am sorry that there was a delay in responding to her call yesterday ( she paged me after hrs with EMS at her home and husband states she had called 4 times before  Within the hr)  Explain that symptoms should improve significantly in next 3 to 5 days and respond to the med, BUT if persistent/ not improving , call for ENT referral

## 2015-12-03 NOTE — Telephone Encounter (Signed)
Hyperdynamic LV function is mostly a good thing. An echocardiogram would not further explain chest pain issues. I don't know that there is any value.

## 2015-12-03 NOTE — Telephone Encounter (Signed)
Feeling better today. Will call back in 3-5 days if she wants a referral to [facility].

## 2015-12-04 ENCOUNTER — Encounter (HOSPITAL_COMMUNITY): Payer: Medicare Other

## 2015-12-04 NOTE — Telephone Encounter (Signed)
I left a message for the patient to call. 

## 2015-12-05 ENCOUNTER — Encounter (HOSPITAL_COMMUNITY): Payer: Medicare Other | Attending: Hematology & Oncology

## 2015-12-05 DIAGNOSIS — D509 Iron deficiency anemia, unspecified: Secondary | ICD-10-CM

## 2015-12-05 DIAGNOSIS — R790 Abnormal level of blood mineral: Secondary | ICD-10-CM

## 2015-12-05 LAB — CBC WITH DIFFERENTIAL/PLATELET
Basophils Absolute: 0 10*3/uL (ref 0.0–0.1)
Basophils Relative: 0 %
Eosinophils Absolute: 0.1 10*3/uL (ref 0.0–0.7)
Eosinophils Relative: 2 %
HCT: 40.6 % (ref 36.0–46.0)
HEMOGLOBIN: 13.6 g/dL (ref 12.0–15.0)
LYMPHS ABS: 2.5 10*3/uL (ref 0.7–4.0)
LYMPHS PCT: 40 %
MCH: 30.5 pg (ref 26.0–34.0)
MCHC: 33.5 g/dL (ref 30.0–36.0)
MCV: 91 fL (ref 78.0–100.0)
Monocytes Absolute: 0.6 10*3/uL (ref 0.1–1.0)
Monocytes Relative: 9 %
NEUTROS PCT: 49 %
Neutro Abs: 3.1 10*3/uL (ref 1.7–7.7)
Platelets: 210 10*3/uL (ref 150–400)
RBC: 4.46 MIL/uL (ref 3.87–5.11)
RDW: 13.2 % (ref 11.5–15.5)
WBC: 6.3 10*3/uL (ref 4.0–10.5)

## 2015-12-05 LAB — COMPREHENSIVE METABOLIC PANEL
ALK PHOS: 58 U/L (ref 38–126)
ALT: 43 U/L (ref 14–54)
AST: 46 U/L — ABNORMAL HIGH (ref 15–41)
Albumin: 4.1 g/dL (ref 3.5–5.0)
Anion gap: 8 (ref 5–15)
BUN: 19 mg/dL (ref 6–20)
CO2: 25 mmol/L (ref 22–32)
CREATININE: 0.9 mg/dL (ref 0.44–1.00)
Calcium: 9.2 mg/dL (ref 8.9–10.3)
Chloride: 105 mmol/L (ref 101–111)
Glucose, Bld: 106 mg/dL — ABNORMAL HIGH (ref 65–99)
Potassium: 3.9 mmol/L (ref 3.5–5.1)
Sodium: 138 mmol/L (ref 135–145)
TOTAL PROTEIN: 6.8 g/dL (ref 6.5–8.1)
Total Bilirubin: 0.7 mg/dL (ref 0.3–1.2)

## 2015-12-05 LAB — FERRITIN: FERRITIN: 133 ng/mL (ref 11–307)

## 2015-12-05 LAB — IRON AND TIBC
IRON: 113 ug/dL (ref 28–170)
SATURATION RATIOS: 30 % (ref 10.4–31.8)
TIBC: 382 ug/dL (ref 250–450)
UIBC: 269 ug/dL

## 2015-12-09 ENCOUNTER — Telehealth (HOSPITAL_COMMUNITY): Payer: Self-pay | Admitting: Emergency Medicine

## 2015-12-09 ENCOUNTER — Telehealth: Payer: Self-pay | Admitting: Family Medicine

## 2015-12-09 ENCOUNTER — Other Ambulatory Visit: Payer: Self-pay

## 2015-12-09 MED ORDER — PROMETHAZINE HCL 25 MG PO TABS: 25.0000 mg | ORAL_TABLET | Freq: Three times a day (TID) | ORAL | Status: DC | PRN

## 2015-12-09 NOTE — Telephone Encounter (Signed)
Medication refilled and patient aware. 

## 2015-12-09 NOTE — Telephone Encounter (Signed)
Refll; x 1 please and let her know

## 2015-12-09 NOTE — Telephone Encounter (Signed)
Called pt to notify her that her iron level was 133.  Left a message if she had any questions to return my call

## 2015-12-09 NOTE — Telephone Encounter (Signed)
-----   Message from Patrici Ranks, MD sent at 12/06/2015  4:45 PM EST ----- Advise iron is good at 133. Dr.P

## 2015-12-09 NOTE — Telephone Encounter (Signed)
Patient is going to find out who is in her network and call back.

## 2015-12-09 NOTE — Telephone Encounter (Signed)
Patient is calling stating that she went to the hospital on 12/02/15 for vertigo and she is asking if she can get promethazine (PHENERGAN) 25 MG tablet refilled for nausea

## 2015-12-09 NOTE — Telephone Encounter (Signed)
Do you agree with refill?

## 2015-12-11 ENCOUNTER — Other Ambulatory Visit: Payer: Self-pay

## 2015-12-11 MED ORDER — LEVOTHYROXINE SODIUM 125 MCG PO TABS: 125.0000 ug | ORAL_TABLET | Freq: Every day | ORAL | Status: DC

## 2015-12-19 ENCOUNTER — Telehealth: Payer: Self-pay | Admitting: Family Medicine

## 2015-12-19 ENCOUNTER — Encounter: Payer: Self-pay | Admitting: *Deleted

## 2015-12-19 MED ORDER — FIRST-DUKES MOUTHWASH MT SUSP: OROMUCOSAL | Status: DC

## 2015-12-19 NOTE — Telephone Encounter (Signed)
No call back from the patient- My Chart message of Dr. Olin Pia comments forwarded to the patient.

## 2015-12-19 NOTE — Telephone Encounter (Signed)
Ok to refill the dukes magic mouthwash for her mouthpain?

## 2015-12-19 NOTE — Addendum Note (Signed)
Addended by: Eual Fines on: 12/19/2015 02:49 PM   Modules accepted: Orders

## 2015-12-19 NOTE — Telephone Encounter (Signed)
MED SENT IN 

## 2015-12-19 NOTE — Telephone Encounter (Signed)
Refill x 1 BUT let her know I recommend evaluation by dentist or ENT if she has specific painful area in the mouth that recurrs, refer if she wants this to Dr Benjamine Mola / her choice pls , I will sign

## 2015-12-19 NOTE — Telephone Encounter (Signed)
Patient is asking for a refill on Dukes Mixture she states her mouth is hurting , please advise?

## 2015-12-26 ENCOUNTER — Telehealth: Payer: Self-pay | Admitting: Family Medicine

## 2015-12-26 NOTE — Telephone Encounter (Signed)
Has a sinus infection and she is asking for a z-pak before it goes to her lungs, please advise?

## 2015-12-27 NOTE — Telephone Encounter (Signed)
Needs clinical evaluation, due to MULTIPLE symptoms of long duration, please explain .Unfortunately , since I am not available for next 4 days she will need to go to urgent care, pls encourage sooner cotact for OV when pts feel need to be seen

## 2015-12-27 NOTE — Telephone Encounter (Signed)
Patient aware.

## 2015-12-27 NOTE — Telephone Encounter (Signed)
Patient states she has had symptoms x 1 week.  She had body aches, cough, congestion, sore throat, and ear pain.  She has had a low grade fever.  Please advise.

## 2016-01-10 ENCOUNTER — Telehealth: Payer: Self-pay | Admitting: Internal Medicine

## 2016-01-10 NOTE — Telephone Encounter (Signed)
New Message    Pt wants an Echo

## 2016-01-13 NOTE — Telephone Encounter (Signed)
Will forward to Dr. Caryl Comes for review. I think this is coming from the "hyperdynamic" EF on her myoview.

## 2016-01-14 NOTE — Telephone Encounter (Signed)
Per Dr. Caryl Comes, he states he called the patient and advised her an echo will not be useful for her at this time.

## 2016-01-15 ENCOUNTER — Encounter (HOSPITAL_COMMUNITY): Payer: Medicare Other | Attending: Hematology & Oncology

## 2016-01-15 DIAGNOSIS — D509 Iron deficiency anemia, unspecified: Secondary | ICD-10-CM | POA: Insufficient documentation

## 2016-01-23 ENCOUNTER — Encounter: Payer: Self-pay | Admitting: Family Medicine

## 2016-01-23 ENCOUNTER — Ambulatory Visit (INDEPENDENT_AMBULATORY_CARE_PROVIDER_SITE_OTHER): Payer: Medicare Other | Admitting: Family Medicine

## 2016-01-23 ENCOUNTER — Encounter (HOSPITAL_COMMUNITY): Payer: Medicare Other

## 2016-01-23 VITALS — BP 100/60 | HR 95 | Resp 18 | Ht 64.0 in | Wt 232.0 lb

## 2016-01-23 DIAGNOSIS — Y92099 Unspecified place in other non-institutional residence as the place of occurrence of the external cause: Secondary | ICD-10-CM

## 2016-01-23 DIAGNOSIS — I499 Cardiac arrhythmia, unspecified: Secondary | ICD-10-CM | POA: Diagnosis not present

## 2016-01-23 DIAGNOSIS — Y92009 Unspecified place in unspecified non-institutional (private) residence as the place of occurrence of the external cause: Secondary | ICD-10-CM

## 2016-01-23 DIAGNOSIS — R0789 Other chest pain: Secondary | ICD-10-CM

## 2016-01-23 DIAGNOSIS — G4489 Other headache syndrome: Secondary | ICD-10-CM

## 2016-01-23 DIAGNOSIS — R790 Abnormal level of blood mineral: Secondary | ICD-10-CM

## 2016-01-23 DIAGNOSIS — E039 Hypothyroidism, unspecified: Secondary | ICD-10-CM

## 2016-01-23 DIAGNOSIS — E785 Hyperlipidemia, unspecified: Secondary | ICD-10-CM

## 2016-01-23 DIAGNOSIS — Z1159 Encounter for screening for other viral diseases: Secondary | ICD-10-CM

## 2016-01-23 DIAGNOSIS — R55 Syncope and collapse: Secondary | ICD-10-CM

## 2016-01-23 DIAGNOSIS — J328 Other chronic sinusitis: Secondary | ICD-10-CM

## 2016-01-23 DIAGNOSIS — W19XXXA Unspecified fall, initial encounter: Secondary | ICD-10-CM

## 2016-01-23 DIAGNOSIS — R7302 Impaired glucose tolerance (oral): Secondary | ICD-10-CM

## 2016-01-23 DIAGNOSIS — E8940 Asymptomatic postprocedural ovarian failure: Secondary | ICD-10-CM

## 2016-01-23 DIAGNOSIS — J329 Chronic sinusitis, unspecified: Secondary | ICD-10-CM

## 2016-01-23 DIAGNOSIS — N958 Other specified menopausal and perimenopausal disorders: Secondary | ICD-10-CM

## 2016-01-23 DIAGNOSIS — D509 Iron deficiency anemia, unspecified: Secondary | ICD-10-CM | POA: Diagnosis not present

## 2016-01-23 LAB — COMPREHENSIVE METABOLIC PANEL
ALBUMIN: 4.1 g/dL (ref 3.5–5.0)
ALT: 42 U/L (ref 14–54)
ANION GAP: 9 (ref 5–15)
AST: 58 U/L — ABNORMAL HIGH (ref 15–41)
Alkaline Phosphatase: 67 U/L (ref 38–126)
BILIRUBIN TOTAL: 1 mg/dL (ref 0.3–1.2)
BUN: 20 mg/dL (ref 6–20)
CHLORIDE: 104 mmol/L (ref 101–111)
CO2: 28 mmol/L (ref 22–32)
Calcium: 9.8 mg/dL (ref 8.9–10.3)
Creatinine, Ser: 0.89 mg/dL (ref 0.44–1.00)
GFR calc Af Amer: >60 mL/min (ref >60)
GFR calc non Af Amer: >60 mL/min (ref >60)
GLUCOSE: 110 mg/dL — AB (ref 65–99)
POTASSIUM: 4.3 mmol/L (ref 3.5–5.1)
Sodium: 141 mmol/L (ref 135–145)
TOTAL PROTEIN: 7 g/dL (ref 6.5–8.1)

## 2016-01-23 LAB — FERRITIN: FERRITIN: 131 ng/mL (ref 11–307)

## 2016-01-23 LAB — CBC WITH DIFFERENTIAL/PLATELET
BASOS PCT: 0 %
Basophils Absolute: 0 10*3/uL (ref 0.0–0.1)
EOS ABS: 0.1 10*3/uL (ref 0.0–0.7)
EOS PCT: 2 %
HEMATOCRIT: 40.5 % (ref 36.0–46.0)
Hemoglobin: 13.7 g/dL (ref 12.0–15.0)
Lymphocytes Relative: 35 %
Lymphs Abs: 2 10*3/uL (ref 0.7–4.0)
MCH: 30.6 pg (ref 26.0–34.0)
MCHC: 33.8 g/dL (ref 30.0–36.0)
MCV: 90.4 fL (ref 78.0–100.0)
MONO ABS: 0.4 10*3/uL (ref 0.1–1.0)
MONOS PCT: 7 %
Neutro Abs: 3.2 10*3/uL (ref 1.7–7.7)
Neutrophils Relative %: 56 %
PLATELETS: 184 10*3/uL (ref 150–400)
RBC: 4.48 MIL/uL (ref 3.87–5.11)
RDW: 13.5 % (ref 11.5–15.5)
WBC: 5.6 10*3/uL (ref 4.0–10.5)

## 2016-01-23 LAB — IRON AND TIBC
Iron: 137 ug/dL (ref 28–170)
SATURATION RATIOS: 34 % — AB (ref 10.4–31.8)
TIBC: 399 ug/dL (ref 250–450)
UIBC: 262 ug/dL

## 2016-01-23 NOTE — Assessment & Plan Note (Addendum)
Syncopal; episode on 1/20 fell in bathroom, no preceeding aura, no recall of event, spouse reports unresponsiveness for less than 5 mins, no witnessed jerking or incontinence of stool or urine Past /o SVT , will refer for cardiology eval Office EKG on day of visit is normal

## 2016-01-23 NOTE — Patient Instructions (Addendum)
F/u in 4.5 month, calll if youn need me sooner  Need HBA1C, tSH, fasting lipid labs asap  eKG looks normal, you are referred to Dr Jens Som for re evaluation  Need xray of sinuses, since you still have sinus pressure after recent treatment of sinus infection through urgent care, and also of skull due to trauma please go to radiology dept  Pls contact us with ne neurologist once you identify one, we will let you know about Dr Merlene Laughter  Saline flushes to nostril once to twice daily please  Thanks for choosing [facility], we consider it a privelige to serve you.

## 2016-01-23 NOTE — Progress Notes (Signed)
Subjective:    Patient ID: [name], female    DOB: 10-Nov-1961, 55 y.o.   MRN: [id number]  HPI The PT is here for follow up and re-evaluation of chronic medical conditions, medication management and review of any available recent lab and radiology data.  Preventive health is updated, specifically  Cancer screening and Immunization.   Questions or concerns regarding consultations or procedures which the PT has had in the interim are  Addressed.Needs to find a new neurologist for economic reasons The PT denies any adverse reactions to current medications since the last visit.  On 1/20 pt collapsed in bathroom after urinating, spouse found her on the floor with water running in the basin, pt has no recall of anything that happened after she was in the bathroom First episode. No associated vertigo. Spouse reportedly states no feces or urine on floor, no witnessed jerking, but he needed to use a damp cloth to arouse her within approx 5 min Pain at occiput and left arm and back where she fell, denies any noted new short term memory impairment, has not been to a health care facility since the episode Concerned that she has been having new chest pain , persists , with no aggravating factor, breaks out in a sweat and is really concerned about blockage, will refer back to card, office eKG today is normal  Has been twice since Sept to neurologist, received trigger point injections, no appreciable improvement and the cost is prohibitive, also the botox which does work formerly is now costing more than she can afford at this office , she is on the search and will call back with MD she finds Was treated through urgent care at the end of the past year for sinusitis and  Bronchitis, still reports suinus pressure over cheek and increased drainage   Review of Systems See HPI Denies recent fever or chills. Denies sinus pressure, nasal congestion, ear pain or sore throat. Denies chest congestion, productive  cough or wheezing.  Denies abdominal pain, nausea, vomiting,diarrhea or constipation.   Denies dysuria, frequency, hesitancy or incontinence.  . Denies depression, anxiety or insomnia.       Objective:   Physical Exam BP 100/60 mmHg  Pulse 95  Resp 18  Ht 5\' 4"  (1.626 m)  Wt 232 lb (105.235 kg)  BMI 39.80 kg/m2  SpO2 96%   Patient alert and oriented and in no cardiopulmonary distress.  HEENT: No facial asymmetry, EOMI,   oropharynx pink and moist.  Neck decreased ROM no JVD, no mass. Tender over occiput on right , no erythema , or break in skin noted. Maxillary sinus tenderness, right greater than left TM clear, no erytema of oropharynx Chest: Clear to auscultation bilaterally.No reproducible chest wall tenderness  CVS: S1, S2 no murmurs, no S3.Regular rate. EKG: normal sinus rhythm, no ischemia, no LVH ABD: Soft non tender.   Ext: No edema  MS: Adequate though reduced  ROM spine, shoulders, hips and knees.  Skin: Intact, no ulcerations or rash noted.Bruising noted on left arm medical aspect   Psych: Good eye contact, normal affect. Memory intact not anxious or depressed appearing.  CNS: CN 2-12 intact, power,  normal throughout.no focal deficits noted.        Assessment & Plan:  Syncope Syncopal; episode on 1/20 fell in bathroom, no preceeding aura, no recall of event, spouse reports unresponsiveness for less than 5 mins, no witnessed jerking or incontinence of stool or urine Past /o SVT , will refer for  cardiology eval Office EKG on day of visit is normal  Postsurgical menopause Called after visit for prenmarin cream originally prescribed by gyne refill , refill sent  Hypothyroidism Updated  Lab needed  Hyperlipidemia LDL goal <100 Hyperlipidemia:Low fat diet discussed and encouraged.   Lipid Panel  Lab Results  Component Value Date   CHOL 129 02/25/2015   HDL 42* 02/25/2015   LDLCALC 51 02/25/2015   LDLDIRECT 138* 09/15/2012   TRIG 180*  02/25/2015   CHOLHDL 3.1 02/25/2015   Updated lab needed at/ before next visit.     Sinusitis, chronic Ongoing sinus pressure and drainage,despite recent antibiotic therapy, will send for xray, no current fever or chills  Irregular heart rate Irregular heart rate and syncope, office EKG shows normal ryhtm and no signs of acute ischemia. Referred back to cardiology due to concern of increased and recurrent  chest pain also  Fall at home No skin breakdown, bruising only  Noted on left arm, c/o pain in occiput. Needs x ray skull

## 2016-01-24 ENCOUNTER — Telehealth: Payer: Self-pay

## 2016-01-24 ENCOUNTER — Other Ambulatory Visit: Payer: Self-pay

## 2016-01-24 DIAGNOSIS — D509 Iron deficiency anemia, unspecified: Secondary | ICD-10-CM

## 2016-01-24 DIAGNOSIS — R790 Abnormal level of blood mineral: Secondary | ICD-10-CM

## 2016-01-24 MED ORDER — ESTROGENS, CONJUGATED 0.625 MG/GM VA CREA: 0.5000 | TOPICAL_CREAM | Freq: Every day | VAGINAL | Status: AC

## 2016-01-24 MED ORDER — BUTALBITAL-APAP-CAFFEINE 50-325-40 MG PO TABS: ORAL_TABLET | ORAL | Status: DC

## 2016-01-24 NOTE — Telephone Encounter (Signed)
Ok to refill fioricet x 2 as previously prescribed. Refill premarin cream x 1 and remind her of need to use sparingly please

## 2016-01-24 NOTE — Telephone Encounter (Signed)
Aid she thought you were going to send her in some fiorcet for her headaches yesterday, and also she needs her premarin refilled. Please advise if ok

## 2016-01-24 NOTE — Telephone Encounter (Signed)
meds refilled 

## 2016-01-26 DIAGNOSIS — G4489 Other headache syndrome: Secondary | ICD-10-CM

## 2016-01-26 DIAGNOSIS — W19XXXA Unspecified fall, initial encounter: Secondary | ICD-10-CM

## 2016-01-26 DIAGNOSIS — J329 Chronic sinusitis, unspecified: Secondary | ICD-10-CM | POA: Insufficient documentation

## 2016-01-26 DIAGNOSIS — Y92009 Unspecified place in unspecified non-institutional (private) residence as the place of occurrence of the external cause: Secondary | ICD-10-CM

## 2016-01-26 HISTORY — DX: Unspecified place in unspecified non-institutional (private) residence as the place of occurrence of the external cause: W19.XXXA

## 2016-01-26 HISTORY — DX: Unspecified place in unspecified non-institutional (private) residence as the place of occurrence of the external cause: Y92.009

## 2016-01-26 HISTORY — DX: Other headache syndrome: G44.89

## 2016-01-26 HISTORY — DX: Chronic sinusitis, unspecified: J32.9

## 2016-01-26 NOTE — Assessment & Plan Note (Signed)
Hyperlipidemia:Low fat diet discussed and encouraged.   Lipid Panel  Lab Results  Component Value Date   CHOL 129 02/25/2015   HDL 42* 02/25/2015   LDLCALC 51 02/25/2015   LDLDIRECT 138* 09/15/2012   TRIG 180* 02/25/2015   CHOLHDL 3.1 02/25/2015   Updated lab needed at/ before next visit.     

## 2016-01-26 NOTE — Assessment & Plan Note (Addendum)
Updated  Lab needed

## 2016-01-26 NOTE — Assessment & Plan Note (Signed)
Called after visit for prenmarin cream originally prescribed by gyne refill , refill sent

## 2016-01-26 NOTE — Assessment & Plan Note (Signed)
Irregular heart rate and syncope, office EKG shows normal ryhtm and no signs of acute ischemia. Referred back to cardiology due to concern of increased and recurrent  chest pain also

## 2016-01-26 NOTE — Assessment & Plan Note (Signed)
No skin breakdown, bruising only  Noted on left arm, c/o pain in occiput. Needs x ray skull

## 2016-01-26 NOTE — Assessment & Plan Note (Signed)
Ongoing sinus pressure and drainage,despite recent antibiotic therapy, will send for xray, no current fever or chills

## 2016-01-27 ENCOUNTER — Other Ambulatory Visit: Payer: Self-pay | Admitting: Family Medicine

## 2016-01-27 ENCOUNTER — Other Ambulatory Visit (HOSPITAL_COMMUNITY)
Admission: RE | Admit: 2016-01-27 | Discharge: 2016-01-27 | Disposition: A | Discharge status: Home / Self Care | Payer: Medicare Other | Source: Ambulatory Visit | Attending: Family Medicine | Admitting: Family Medicine

## 2016-01-27 ENCOUNTER — Ambulatory Visit (HOSPITAL_COMMUNITY)
Admission: RE | Admit: 2016-01-27 | Discharge: 2016-01-27 | Disposition: A | Discharge status: Home / Self Care | Payer: Medicare Other | Source: Ambulatory Visit | Attending: Family Medicine | Admitting: Family Medicine

## 2016-01-27 DIAGNOSIS — E785 Hyperlipidemia, unspecified: Secondary | ICD-10-CM | POA: Insufficient documentation

## 2016-01-27 DIAGNOSIS — R7302 Impaired glucose tolerance (oral): Secondary | ICD-10-CM | POA: Insufficient documentation

## 2016-01-27 DIAGNOSIS — E039 Hypothyroidism, unspecified: Secondary | ICD-10-CM | POA: Diagnosis not present

## 2016-01-27 DIAGNOSIS — J329 Chronic sinusitis, unspecified: Secondary | ICD-10-CM

## 2016-01-27 DIAGNOSIS — R55 Syncope and collapse: Secondary | ICD-10-CM

## 2016-01-27 DIAGNOSIS — Z1159 Encounter for screening for other viral diseases: Secondary | ICD-10-CM | POA: Insufficient documentation

## 2016-01-27 DIAGNOSIS — E030 Congenital hypothyroidism with diffuse goiter: Secondary | ICD-10-CM | POA: Diagnosis present

## 2016-01-27 LAB — LIPID PANEL
CHOLESTEROL: 160 mg/dL (ref 0–200)
HDL: 40 mg/dL — AB (ref >40)
LDL CALC: 78 mg/dL (ref 0–99)
Total CHOL/HDL Ratio: 4 RATIO
Triglycerides: 209 mg/dL — ABNORMAL HIGH (ref <150)
VLDL: 42 mg/dL — AB (ref 0–40)

## 2016-01-27 LAB — TSH: TSH: 3.576 u[IU]/mL (ref 0.350–4.500)

## 2016-01-28 ENCOUNTER — Encounter: Payer: Self-pay | Admitting: *Deleted

## 2016-01-28 LAB — HEMOGLOBIN A1C
Hgb A1c MFr Bld: 5.8 % — ABNORMAL HIGH (ref 4.8–5.6)
MEAN PLASMA GLUCOSE: 120 mg/dL

## 2016-01-28 LAB — HEPATITIS C ANTIBODY: HCV Ab: <0.1 s/co ratio (ref 0.0–0.9)

## 2016-01-29 ENCOUNTER — Ambulatory Visit (INDEPENDENT_AMBULATORY_CARE_PROVIDER_SITE_OTHER): Payer: Medicare Other | Admitting: Nurse Practitioner

## 2016-01-29 ENCOUNTER — Other Ambulatory Visit: Payer: Self-pay

## 2016-01-29 ENCOUNTER — Encounter: Payer: Self-pay | Admitting: Nurse Practitioner

## 2016-01-29 ENCOUNTER — Ambulatory Visit: Payer: Medicare Other | Admitting: Nurse Practitioner

## 2016-01-29 VITALS — BP 106/68 | HR 86 | Ht 64.0 in | Wt 233.0 lb

## 2016-01-29 DIAGNOSIS — R0789 Other chest pain: Secondary | ICD-10-CM | POA: Diagnosis not present

## 2016-01-29 DIAGNOSIS — E559 Vitamin D deficiency, unspecified: Secondary | ICD-10-CM

## 2016-01-29 DIAGNOSIS — R55 Syncope and collapse: Secondary | ICD-10-CM

## 2016-01-29 NOTE — Progress Notes (Signed)
Electrophysiology Office Note Date: 01/29/2016  ID:  [name], DOB Sep 13, 1961, MRN [id number]  PCP: [name], MD Electrophysiologist: Caryl Comes  CC: syncope follow up  [name] is a 55 y.o. female seen today for Dr Caryl Comes.  She presents today for followup for persistent chest pain as well as syncopal episode that occurred in bathroom 01/17/16.  The episode occurred about 7:30PM at night. She had eaten and drank like normal that day.  She excused herself to go to the bathroom, urinated, flushed the toilet and was standing up washing her hands when she lost consciousness.  She fell backwards and hit her head. Her husband came into the room a couple of minutes later and revived her with a wet washcloth.  She was seen by her PCP soon afterwards with reassuring labs and a negative head scan.  She has also had persistent atypical chest pain that can last for hours and is not associated with exertion or relieved with rest. She has occasional tachypalpitations that are short lived.  She also complains of bilateral hand swelling in the mornings occasionally. She denies dyspnea, PND, orthopnea, nausea, vomiting, dizziness, weight gain, or early satiety.  Myoview 10/2015 demonstrated EF 84%, breast attenuation, no ischemia or prior infarct.  Past Medical History  Diagnosis Date  . SVT (supraventricular tachycardia) ([place])   . Hypercholesterolemia   . Bilateral ovarian cysts   . Perforation bowel ([place])   . PE (pulmonary embolism)   . Thyroid disease   . GERD (gastroesophageal reflux disease)   . Sepsis(995.91)   . E coli infection   . Anxiety   . Pneumonia   . Depression   . Carpal tunnel syndrome   . DVT (deep venous thrombosis) ([place])   . Anemia   . Irregular heartbeat   . Stroke ([place])   . DVT (deep venous thrombosis) ([place])   . Blood transfusion without reported diagnosis   . Clotting disorder ([place])   . Fibromyalgia   . Hx of migraines   . Interstitial cystitis   . Acute  renal failure ([place])     renal faliure with surgery   . Cancer [facility])     left breast   . Medically noncompliant 02/28/2015  . H/O bilateral salpingo-oophorectomy 02/28/2015   Past Surgical History  Procedure Laterality Date  . Heart ablation    . Vena cava filter placement    . Abdominal hysterectomy    . Hernia repair    . Bowel resection    . Carpal tunnel release    . Bowel fistula    . Breast enhancement surgery  1983  . Removal of breast implants  1993  . Cesarean section  1986  . Colon surgery    . Cosmetic surgery    . Enterocutaneous fistula closure N/A 08/09/2013    [facility], Dr Dossie Der  . Mastectomy    . Breast surgery    . Appendectomy    . Cervical spine surgery    . Fracture surgery      arm right (as a child)   . Back surgery      Current Outpatient Prescriptions  Medication Sig Dispense Refill  . ALPRAZolam (XANAX) 1 MG tablet Take 1 tablet (1 mg total) by mouth 2 (two) times daily as needed for anxiety. 60 tablet 4  . butalbital-acetaminophen-caffeine (FIORICET, ESGIC) 50-325-40 MG tablet TAKE (1) TABLET BY MOUTH TWICE A DAY AS NEEDED FOR MIGRAINES. 14 tablet 2  . conjugated estrogens (PREMARIN) vaginal cream  Place 0.5 Applicatorfuls vaginally at bedtime. 42.5 g 0  . cyclobenzaprine (FLEXERIL) 10 MG tablet Take 1 tablet (10 mg total) by mouth 2 (two) times daily as needed for muscle spasms. 180 tablet 1  . Diphenhyd-Hydrocort-Nystatin (FIRST-DUKES MOUTHWASH) SUSP Take 5 ml by mouth gargle and swish twice daily 237 mL 0  . DULoxetine (CYMBALTA) 30 MG capsule Take 1 capsule (30 mg total) by mouth 2 (two) times daily. 180 capsule 1  . esomeprazole (NEXIUM) 40 MG capsule Take 1 capsule (40 mg total) by mouth 2 (two) times daily before a meal. 180 capsule 1  . fenofibrate (TRICOR) 145 MG tablet Take 1 tablet (145 mg total) by mouth at bedtime. 90 tablet 1  . Glycerin, Laxative, 80.7 % SUPP Place 1 suppository rectally at bedtime as needed (for constipation).     Marland Kitchen  HYDROmorphone (DILAUDID) 4 MG tablet Take 8 mg by mouth 2 (two) times daily.  0  . levothyroxine (SYNTHROID, LEVOTHROID) 125 MCG tablet Take 1 tablet (125 mcg total) by mouth daily before breakfast. 90 tablet 0  . magnesium oxide (MAG-OX) 400 MG tablet Take 400 mg by mouth daily.     . meclizine (ANTIVERT) 25 MG tablet Take 1 tablet (25 mg total) by mouth 3 (three) times daily as needed for dizziness. 30 tablet 0  . Multiple Vitamins-Minerals (HAIR/SKIN/NAILS PO) Take 1 tablet by mouth daily.    . Multiple Vitamins-Minerals (ICAPS) CAPS Take 1 capsule by mouth at bedtime.     . ondansetron (ZOFRAN) 4 MG tablet Take 4 mg by mouth every 8 (eight) hours as needed for nausea or vomiting.    . polyethylene glycol (MIRALAX / GLYCOLAX) packet Take 17 g by mouth daily as needed for mild constipation or moderate constipation.     . pravastatin (PRAVACHOL) 80 MG tablet Take 1 tablet (80 mg total) by mouth at bedtime. 90 tablet 1  . promethazine (PHENERGAN) 25 MG tablet Take 1 tablet (25 mg total) by mouth every 8 (eight) hours as needed for nausea or vomiting. 30 tablet 1  . senna-docusate (SENOKOT-S) 8.6-50 MG per tablet Take 2 tablets by mouth 3 (three) times daily. constipation    . verapamil (CALAN) 120 MG tablet Take 120 mg by mouth 2 (two) times daily.     . vitamin C (ASCORBIC ACID) 500 MG tablet Take 500 mg by mouth daily.     . Vitamin D, Ergocalciferol, (DRISDOL) 50000 UNITS CAPS capsule Take 1 capsule (50,000 Units total) by mouth every 7 (seven) days. (Patient taking differently: Take 50,000 Units by mouth every Wednesday. ) 4 capsule 5   No current facility-administered medications for this visit.    Allergies:   Bisacodyl; Clarithromycin; Clarithromycin; Clindamycin hcl; Codeine; Monosodium glutamate; Nitrofurantoin; and Scopolamine hbr   Social History: Social History   Social History  . Marital Status: Married    Spouse Name: N/A  . Number of Children: N/A  . Years of Education: N/A     Occupational History  . Not on file.   Social History Main Topics  . Smoking status: Never Smoker   . Smokeless tobacco: Never Used  . Alcohol Use: No     Comment: rare  . Drug Use: No  . Sexual Activity: Yes    Birth Control/ Protection: Surgical   Other Topics Concern  . Not on file   Social History Narrative    Family History: Family History  Problem Relation Age of Onset  . Diabetes Mother   . Heart  disease Mother   . Hypertension Mother   . Heart disease Father   . Hyperlipidemia Father   . Hypertension Father   . Colon cancer Maternal Aunt   . Cancer Maternal Uncle     mets  . Bone cancer Maternal Grandfather     mets  . Ovarian cancer Cousin 56  . Prostate cancer Maternal Uncle     Review of Systems: All other systems reviewed and are otherwise negative except as noted above.   Physical Exam: VS:  BP 106/68 mmHg  Pulse 86  Ht 5\' 4"  (1.626 m)  Wt 233 lb (105.688 kg)  BMI 39.97 kg/m2 , BMI Body mass index is 39.97 kg/(m^2). Wt Readings from Last 3 Encounters:  01/29/16 233 lb (105.688 kg)  01/23/16 232 lb (105.235 kg)  12/02/15 210 lb (95.255 kg)    GEN- The patient is obese appearing, alert and oriented x 3 today.   HEENT: normocephalic, atraumatic; sclera clear, conjunctiva pink; hearing intact; oropharynx clear; neck supple Lungs- Clear to ausculation bilaterally, normal work of breathing.  No wheezes, rales, rhonchi Heart- Regular rate and rhythm GI- soft, non-tender, non-distended, bowel sounds present, +multiple scars, abdominal binder in place  Extremities- no clubbing, cyanosis, or edema; DP/PT/radial pulses 2+ bilaterally MS- no significant deformity or atrophy Skin- warm and dry, no rash or lesion  Psych- euthymic mood, full affect Neuro- strength and sensation are intact   EKG:  EKG is not ordered today.  Recent Labs: 01/23/2016: ALT 42; BUN 20; Creatinine, Ser 0.89; Hemoglobin 13.7; Platelets 184; Potassium 4.3; Sodium  141 01/27/2016: TSH 3.576    Other studies Reviewed: Additional studies/ records that were reviewed today include: Dr Olin Pia office notes, myoview   Assessment and Plan: 1.  Atypical chest pain The patient has persistent atypical chest pain not associated with exertion and not relieved by rest. She has had reassuring myoview recently.   2.  Syncope Unlikely to be arrhythmic in nature with prolonged episode Most likely vasovagal Adequate hydration encouraged Labs following episode unremarkable Will check echo   Current medicines are reviewed at length with the patient today.   The patient does not have concerns regarding her medicines.  The following changes were made today:  none  Labs/ tests ordered today include:  Orders Placed This Encounter  Procedures  . Echocardiogram     Disposition:   Follow up with Dr Caryl Comes in 6 months    Signed, Chanetta Marshall, NP 01/29/2016 10:21 AM   [place] [address] [phone] (office) [phone] (fax)

## 2016-01-29 NOTE — Patient Instructions (Addendum)
Medication Instructions:   Your physician recommends that you continue on your current medications as directed. Please refer to the Current Medication list given to you today.  If you need a refill on your cardiac medications before your next appointment, please call your pharmacy.  Labwork: NONE ORDER TODAY    Testing/Procedures:  Your physician has requested that you have an echocardiogram. Echocardiography is a painless test that uses sound waves to create images of your heart. It provides your doctor with information about the size and shape of your heart and how well your heart's chambers and valves are working. This procedure takes approximately one hour. There are no restrictions for this procedure.    Follow-Up:  Your physician wants you to follow-up in:  IN  6  MONTHS WITH DR KLEIN You will receive a reminder letter in the mail two months in advance. If you don't receive a letter, please call our office to schedule the follow-up appointment.     Any Other Special Instructions Will Be Listed Below (If Applicable).                                                                                                                                                       

## 2016-02-04 ENCOUNTER — Other Ambulatory Visit: Payer: Self-pay

## 2016-02-04 ENCOUNTER — Ambulatory Visit (HOSPITAL_COMMUNITY): Payer: Medicare Other | Attending: Cardiovascular Disease

## 2016-02-04 DIAGNOSIS — I517 Cardiomegaly: Secondary | ICD-10-CM | POA: Diagnosis not present

## 2016-02-04 DIAGNOSIS — E785 Hyperlipidemia, unspecified: Secondary | ICD-10-CM | POA: Insufficient documentation

## 2016-02-04 DIAGNOSIS — R55 Syncope and collapse: Secondary | ICD-10-CM | POA: Diagnosis not present

## 2016-02-04 DIAGNOSIS — Z8249 Family history of ischemic heart disease and other diseases of the circulatory system: Secondary | ICD-10-CM | POA: Diagnosis not present

## 2016-02-04 DIAGNOSIS — I340 Nonrheumatic mitral (valve) insufficiency: Secondary | ICD-10-CM | POA: Diagnosis not present

## 2016-02-04 HISTORY — PX: TRANSTHORACIC ECHOCARDIOGRAM: SHX275

## 2016-02-05 ENCOUNTER — Telehealth: Payer: Self-pay | Admitting: Family Medicine

## 2016-02-05 DIAGNOSIS — Z95828 Presence of other vascular implants and grafts: Secondary | ICD-10-CM

## 2016-02-05 DIAGNOSIS — R1031 Right lower quadrant pain: Secondary | ICD-10-CM

## 2016-02-05 NOTE — Telephone Encounter (Signed)
Her filter is fractured and she is starting to hurt in her groin area on the right side, she is asking if Dr. Moshe Cipro would order a scan to see if the IVC Filter has moved, please advise?

## 2016-02-05 NOTE — Telephone Encounter (Signed)
Spoke with pt and she states has info from [facility] stating the filter is fractured and needed to be addressed. States today experiencing pain iin right groin. Of note Ct report in 2016 make no mention of abn of filter Discussed with pt and will refer to vascular in [place] for further eval. Needs to go to [facility] for increased pain if before the appt , she understands

## 2016-02-10 ENCOUNTER — Telehealth: Payer: Self-pay | Admitting: *Deleted

## 2016-02-10 NOTE — Telephone Encounter (Signed)
-----   Message from Barbara Berthold, NP sent at 02/08/2016  3:39 PM EST ----- Please notify patient of echo results. Heart pumping function normal. No wall motion abnormalities to suggest blockage disease.

## 2016-02-14 ENCOUNTER — Encounter: Payer: Self-pay | Admitting: Vascular Surgery

## 2016-02-21 ENCOUNTER — Encounter: Payer: Medicare Other | Admitting: Vascular Surgery

## 2016-02-24 ENCOUNTER — Other Ambulatory Visit: Payer: Self-pay

## 2016-02-24 ENCOUNTER — Telehealth: Payer: Self-pay | Admitting: Family Medicine

## 2016-02-24 DIAGNOSIS — R55 Syncope and collapse: Secondary | ICD-10-CM | POA: Diagnosis not present

## 2016-02-24 DIAGNOSIS — M545 Low back pain: Secondary | ICD-10-CM | POA: Diagnosis not present

## 2016-02-24 DIAGNOSIS — G43711 Chronic migraine without aura, intractable, with status migrainosus: Secondary | ICD-10-CM | POA: Diagnosis not present

## 2016-02-24 MED ORDER — NEXIUM 40 MG PO CPDR
40.0000 mg | DELAYED_RELEASE_CAPSULE | Freq: Two times a day (BID) | ORAL | Status: DC
Start: 2016-02-24 — End: 2016-07-27

## 2016-02-24 NOTE — Telephone Encounter (Signed)
Medication sent to pharmacy as brand name.

## 2016-02-24 NOTE — Telephone Encounter (Signed)
Noted , thanks , pls let her know

## 2016-02-24 NOTE — Telephone Encounter (Signed)
Patient states that a generic medication for Nexium was called in for her and she is actually needing the name brand, her insurance will pay please advise?

## 2016-02-26 ENCOUNTER — Encounter (HOSPITAL_COMMUNITY): Payer: Medicare Other | Attending: Hematology & Oncology

## 2016-02-26 DIAGNOSIS — D509 Iron deficiency anemia, unspecified: Secondary | ICD-10-CM | POA: Insufficient documentation

## 2016-03-02 ENCOUNTER — Encounter: Payer: Self-pay | Admitting: Vascular Surgery

## 2016-03-03 ENCOUNTER — Other Ambulatory Visit (HOSPITAL_COMMUNITY): Payer: Self-pay | Admitting: Oncology

## 2016-03-03 ENCOUNTER — Encounter (HOSPITAL_COMMUNITY): Payer: Medicare Other

## 2016-03-03 DIAGNOSIS — R790 Abnormal level of blood mineral: Secondary | ICD-10-CM

## 2016-03-03 DIAGNOSIS — D509 Iron deficiency anemia, unspecified: Secondary | ICD-10-CM | POA: Diagnosis not present

## 2016-03-03 LAB — CBC WITH DIFFERENTIAL/PLATELET
BASOS ABS: 0 10*3/uL (ref 0.0–0.1)
BASOS PCT: 0 %
EOS ABS: 0.1 10*3/uL (ref 0.0–0.7)
EOS PCT: 2 %
HCT: 40.7 % (ref 36.0–46.0)
Hemoglobin: 13.6 g/dL (ref 12.0–15.0)
Lymphocytes Relative: 40 %
Lymphs Abs: 2 10*3/uL (ref 0.7–4.0)
MCH: 30.5 pg (ref 26.0–34.0)
MCHC: 33.4 g/dL (ref 30.0–36.0)
MCV: 91.3 fL (ref 78.0–100.0)
Monocytes Absolute: 0.4 10*3/uL (ref 0.1–1.0)
Monocytes Relative: 9 %
Neutro Abs: 2.4 10*3/uL (ref 1.7–7.7)
Neutrophils Relative %: 49 %
PLATELETS: 188 10*3/uL (ref 150–400)
RBC: 4.46 MIL/uL (ref 3.87–5.11)
RDW: 13.4 % (ref 11.5–15.5)
WBC: 5 10*3/uL (ref 4.0–10.5)

## 2016-03-03 LAB — COMPREHENSIVE METABOLIC PANEL
ALBUMIN: 4 g/dL (ref 3.5–5.0)
ALT: 28 U/L (ref 14–54)
AST: 32 U/L (ref 15–41)
Alkaline Phosphatase: 66 U/L (ref 38–126)
Anion gap: 8 (ref 5–15)
BUN: 23 mg/dL — AB (ref 6–20)
CHLORIDE: 106 mmol/L (ref 101–111)
CO2: 28 mmol/L (ref 22–32)
CREATININE: 0.9 mg/dL (ref 0.44–1.00)
Calcium: 8.9 mg/dL (ref 8.9–10.3)
GFR calc non Af Amer: >60 mL/min (ref >60)
Glucose, Bld: 116 mg/dL — ABNORMAL HIGH (ref 65–99)
Potassium: 4.3 mmol/L (ref 3.5–5.1)
SODIUM: 142 mmol/L (ref 135–145)
Total Bilirubin: 0.6 mg/dL (ref 0.3–1.2)
Total Protein: 6.8 g/dL (ref 6.5–8.1)

## 2016-03-03 LAB — FERRITIN: Ferritin: 90 ng/mL (ref 11–307)

## 2016-03-03 LAB — IRON AND TIBC
Iron: 78 ug/dL (ref 28–170)
Saturation Ratios: 20 % (ref 10.4–31.8)
TIBC: 396 ug/dL (ref 250–450)
UIBC: 318 ug/dL

## 2016-03-04 DIAGNOSIS — M4806 Spinal stenosis, lumbar region: Secondary | ICD-10-CM | POA: Diagnosis not present

## 2016-03-04 DIAGNOSIS — M5416 Radiculopathy, lumbar region: Secondary | ICD-10-CM | POA: Diagnosis not present

## 2016-03-05 ENCOUNTER — Ambulatory Visit (INDEPENDENT_AMBULATORY_CARE_PROVIDER_SITE_OTHER): Payer: Medicare Other | Admitting: Vascular Surgery

## 2016-03-05 ENCOUNTER — Encounter: Payer: Self-pay | Admitting: Vascular Surgery

## 2016-03-05 VITALS — BP 126/83 | HR 98 | Temp 98.4°F | Resp 18 | Ht 64.0 in | Wt 233.7 lb

## 2016-03-05 DIAGNOSIS — M549 Dorsalgia, unspecified: Secondary | ICD-10-CM

## 2016-03-05 DIAGNOSIS — Z95828 Presence of other vascular implants and grafts: Secondary | ICD-10-CM

## 2016-03-05 NOTE — Progress Notes (Signed)
Referred by:  Fayrene Helper, MD [address], [place], [place] [address]   Reason for referral: IVC filter removal   History of Present Illness  [name] is a 55 y.o. (1961/12/05) female who presents with chief complaint: back pain.  This patient has a complicated surgical history of multiple operations for breast and ovarian cancer and complications from those surgeries including enterotomies and fascial dehiscence.  In May 08, 2005 she underwent Greenfield filter placement due to bleeding complications after breast surgery with known history prior DVT and PE.  She reportedly did not have any complications from the IVC filter.  In the last year, she has develop R groin pain that radiates to her R lower back.  She was referred to Neurosurgery who diagnosed her with spinal stenosis.  She has been receiving steroid injections with >90% reduction in her pain.  She has been able to taper off her pain regimen at this point.  In the process of this work-up, fracture of the prior Greenfield filter was imaged.  The patient was referred to this practice for evaluation for removal of this IVC filter.  Past Medical History  Diagnosis Date  . SVT (supraventricular tachycardia) ([place])   . Hypercholesterolemia   . Bilateral ovarian cysts   . Perforation bowel ([place])   . PE (pulmonary embolism)   . Thyroid disease   . GERD (gastroesophageal reflux disease)   . Sepsis(995.91)   . E coli infection   . Anxiety   . Pneumonia   . Depression   . Carpal tunnel syndrome   . DVT (deep venous thrombosis) ([place])   . Anemia   . Irregular heartbeat   . Stroke ([place])   . DVT (deep venous thrombosis) ([place])   . Blood transfusion without reported diagnosis   . Clotting disorder ([place])   . Fibromyalgia   . Hx of migraines   . Interstitial cystitis   . Acute renal failure ([place])     renal faliure with surgery   . Cancer [facility])     left breast   . Medically noncompliant 02/28/2015  . H/O  bilateral salpingo-oophorectomy 02/28/2015    Past Surgical History  Procedure Laterality Date  . Heart ablation    . Vena cava filter placement    . Abdominal hysterectomy    . Hernia repair    . Bowel resection    . Carpal tunnel release    . Bowel fistula    . Breast enhancement surgery  1983  . Removal of breast implants  1993  . Cesarean section  1986  . Colon surgery    . Cosmetic surgery    . Enterocutaneous fistula closure N/A 08/09/2013    [facility], Dr Dossie Der  . Mastectomy    . Breast surgery    . Appendectomy    . Cervical spine surgery    . Fracture surgery      arm right (as a child)     Social History   Social History  . Marital Status: Married    Spouse Name: N/A  . Number of Children: N/A  . Years of Education: N/A   Occupational History  . Not on file.   Social History Main Topics  . Smoking status: Never Smoker   . Smokeless tobacco: Never Used  . Alcohol Use: No     Comment: rare  . Drug Use: No  . Sexual Activity: Yes    Birth Control/ Protection: Surgical  Other Topics Concern  . Not on file   Social History Narrative    Family History  Problem Relation Age of Onset  . Diabetes Mother   . Heart disease Mother   . Hypertension Mother   . Heart disease Father   . Hyperlipidemia Father   . Hypertension Father   . Colon cancer Maternal Aunt   . Cancer Maternal Uncle     mets  . Bone cancer Maternal Grandfather     mets  . Ovarian cancer Cousin 49  . Prostate cancer Maternal Uncle     Current Outpatient Prescriptions  Medication Sig Dispense Refill  . ALPRAZolam (XANAX) 1 MG tablet Take 1 tablet (1 mg total) by mouth 2 (two) times daily as needed for anxiety. 60 tablet 4  . butalbital-acetaminophen-caffeine (FIORICET, ESGIC) 50-325-40 MG tablet TAKE (1) TABLET BY MOUTH TWICE A DAY AS NEEDED FOR MIGRAINES. 14 tablet 2  . conjugated estrogens (PREMARIN) vaginal cream Place 0.5 Applicatorfuls vaginally at bedtime. 42.5 g 0  .  cyclobenzaprine (FLEXERIL) 10 MG tablet Take 1 tablet (10 mg total) by mouth 2 (two) times daily as needed for muscle spasms. 180 tablet 1  . DULoxetine (CYMBALTA) 30 MG capsule Take 1 capsule (30 mg total) by mouth 2 (two) times daily. 180 capsule 1  . fenofibrate (TRICOR) 145 MG tablet Take 1 tablet (145 mg total) by mouth at bedtime. 90 tablet 1  . Glycerin, Laxative, 80.7 % SUPP Place 1 suppository rectally at bedtime as needed (for constipation).     Marland Kitchen HYDROmorphone (DILAUDID) 4 MG tablet Take 8 mg by mouth 2 (two) times daily.  0  . levothyroxine (SYNTHROID, LEVOTHROID) 125 MCG tablet Take 1 tablet (125 mcg total) by mouth daily before breakfast. 90 tablet 0  . magnesium oxide (MAG-OX) 400 MG tablet Take 400 mg by mouth daily.     . Multiple Vitamins-Minerals (HAIR/SKIN/NAILS PO) Take 1 tablet by mouth daily.    . Multiple Vitamins-Minerals (ICAPS) CAPS Take 1 capsule by mouth at bedtime.     Marland Kitchen NEXIUM 40 MG capsule Take 1 capsule (40 mg total) by mouth 2 (two) times daily before a meal. 180 capsule 0  . ondansetron (ZOFRAN) 4 MG tablet Take 4 mg by mouth every 8 (eight) hours as needed for nausea or vomiting.    . polyethylene glycol (MIRALAX / GLYCOLAX) packet Take 17 g by mouth daily as needed for mild constipation or moderate constipation.     . pravastatin (PRAVACHOL) 80 MG tablet Take 1 tablet (80 mg total) by mouth at bedtime. 90 tablet 1  . promethazine (PHENERGAN) 25 MG tablet Take 1 tablet (25 mg total) by mouth every 8 (eight) hours as needed for nausea or vomiting. 30 tablet 1  . senna-docusate (SENOKOT-S) 8.6-50 MG per tablet Take 2 tablets by mouth 3 (three) times daily. constipation    . verapamil (CALAN) 120 MG tablet Take 120 mg by mouth 2 (two) times daily.     . vitamin C (ASCORBIC ACID) 500 MG tablet Take 500 mg by mouth daily.     . Vitamin D, Ergocalciferol, (DRISDOL) 50000 UNITS CAPS capsule Take 1 capsule (50,000 Units total) by mouth every 7 (seven) days. (Patient  taking differently: Take 50,000 Units by mouth every Wednesday. ) 4 capsule 5  . Diphenhyd-Hydrocort-Nystatin (FIRST-DUKES MOUTHWASH) SUSP Take 5 ml by mouth gargle and swish twice daily (Patient not taking: Reported on 03/05/2016) 237 mL 0  . meclizine (ANTIVERT) 25 MG tablet Take 1 tablet (  25 mg total) by mouth 3 (three) times daily as needed for dizziness. (Patient not taking: Reported on 03/05/2016) 30 tablet 0   No current facility-administered medications for this visit.     Allergies  Allergen Reactions  . Bisacodyl Other (See Comments)    Makes patient feel like she is having cramps  . Clarithromycin Hives  . Clarithromycin Other (See Comments)    Cluster migraines   . Clindamycin Hcl Hives  . Codeine Itching  . Monosodium Glutamate Other (See Comments)    Cluster migraines  . Nitrofurantoin Hives  . Scopolamine Hbr Other (See Comments)    Cluster migraines, impaired vision     REVIEW OF SYSTEMS:  (Positives checked otherwise negative)  CARDIOVASCULAR:   [x]  chest pain,  [ ]  chest pressure,  [x]  palpitations,  [ ]  shortness of breath when laying flat,  [ ]  shortness of breath with exertion,   [ ]  pain in feet when walking,  [ ]  pain in feet when laying flat, [ ]  history of blood clot in veins (DVT),  [ ]  history of phlebitis,  [ ]  swelling in legs,  [ ]  varicose veins  PULMONARY:   [ ]  productive cough,  [ ]  asthma,  [ ]  wheezing  NEUROLOGIC:   [ ]  weakness in arms or legs,  [ ]  numbness in arms or legs,  [ ]  difficulty speaking or slurred speech,  [ ]  temporary loss of vision in one eye,  [ ]  dizziness  HEMATOLOGIC:   [x]  bleeding problems,  [x]  problems with blood clotting too easily  MUSCULOSKEL:   [ ]  joint pain, [ ]  joint swelling  GASTROINTEST:   [ ]  vomiting blood,  [ ]  blood in stool     GENITOURINARY:   [ ]  burning with urination,  [ ]  blood in urine  PSYCHIATRIC:   [ ]  history of major depression  INTEGUMENTARY:   [ ]  rashes,  [ ]   ulcers  CONSTITUTIONAL:   [ ]  fever,  [ ]  chills   Physical Examination  Filed Vitals:   03/05/16 1329  BP: 126/83  Pulse: 98  Temp: 98.4 F (36.9 C)  TempSrc: Oral  Resp: 18  Height: 5\' 4"  (1.626 m)  Weight: 233 lb 11.2 oz (106.006 kg)  SpO2: 97%   Body mass index is 40.09 kg/(m^2).  General: A&O x 3, WD, Obese,   Head: [place]/AT  Ear/Nose/Throat: Hearing grossly intact, nares w/o erythema or drainage, oropharynx w/o Erythema/Exudate, Mallampati score: 3  Eyes: PERRLA, EOMI  Neck: Supple, no nuchal rigidity, no palpable LAD  Pulmonary: Sym exp, good air movt, CTAB, no rales, rhonchi, & wheezing  Cardiac: RRR, Nl S1, S2, no Murmurs, rubs or gallops  Vascular: Vessel Right Left  Radial Palpable Palpable  Brachial Palpable Palpable  Carotid Palpable, without bruit Palpable, without bruit  Aorta Not palpable N/A  Femoral Palpable Palpable  Popliteal Not palpable Not palpable  PT Palpable Palpable  DP Faintly Palpable Faintly Palpable   Gastrointestinal: soft, NTND, multiple incisions which are healed, no ostomies, incision hernias evident, no G/R, no HSM, no masses, no CVAT B  Musculoskeletal: M/S 5/5 throughout, Extremities without ischemic changes, no LDS, mild edema B  Neurologic: CN grossly intact, Pain and light touch intact in extremities, Motor exam as listed above  Psychiatric: Judgment intact, Mood & affect appropriate for pt's clinical situation  Dermatologic: See M/S exam for extremity exam, no rashes otherwise noted  Lymph : No Cervical, Axillary, or Inguinal lymphadenopathy  CT abd/pelvis w/ contrast (07/03/15) Based on my review of this patient's CT, she has an Greenfield filter in the IVC with possibly a posterior limb protruding through the IVC adjacent to the R side of the vertebral column.  I can't tell if this is a fracture or not.  Outside Studies/Documentation 3 pages of outside documents were reviewed including: outside Lumbar Xray report  and prior IVC filter Op Note.   Medical Decision Making  [name] is a 55 y.o. female who presents with: complex surgical history with multiple complications, possible protuding limb from IVC filter into vertebral column.   I can't definitively see a frank fracture in the IVC filter, but it definitely appears that one limb is protruding through the IVC.  I don't see obvious evidence of nerve root impingement by that limb, so I have no way of saying if the IVC filter is contributing to her back pain.  Locally, no vascular surgeon or radiologist has experience with permanent IVC filter retrieval.  The closest expert in this area would be Dr. Evans Lance at [facility].  I will refer her to him, as she has interest in having her IVC filter removed.  Thank you for allowing [place] to participate in this patient's care.   Adele Barthel, MD [facility] Office: [phone] Pager: [phone]  03/05/2016, 1:58 PM

## 2016-03-09 ENCOUNTER — Other Ambulatory Visit: Payer: Self-pay | Admitting: Family Medicine

## 2016-03-09 NOTE — Addendum Note (Signed)
Addended by: Mena Goes on: 03/09/2016 04:43 PM   Modules accepted: Orders

## 2016-03-11 ENCOUNTER — Encounter (HOSPITAL_BASED_OUTPATIENT_CLINIC_OR_DEPARTMENT_OTHER): Payer: Medicare Other

## 2016-03-11 ENCOUNTER — Encounter (HOSPITAL_COMMUNITY): Payer: Self-pay

## 2016-03-11 VITALS — BP 106/70 | HR 85 | Temp 98.3°F | Resp 18

## 2016-03-11 DIAGNOSIS — D509 Iron deficiency anemia, unspecified: Secondary | ICD-10-CM

## 2016-03-11 MED ORDER — SODIUM CHLORIDE 0.9 % IV SOLN
125.0000 mg | Freq: Once | INTRAVENOUS | Status: AC
  Administered 2016-03-11: 125 mg via INTRAVENOUS
  Filled 2016-03-11: qty 10

## 2016-03-11 MED ORDER — SODIUM CHLORIDE 0.9 % IV SOLN
INTRAVENOUS | Status: DC
  Administered 2016-03-11: 14:00:00 via INTRAVENOUS

## 2016-03-11 NOTE — Patient Instructions (Signed)
..  [place] at [facility] Discharge Instructions  RECOMMENDATIONS MADE BY THE CONSULTANT AND ANY TEST RESULTS WILL BE SENT TO YOUR REFERRING PHYSICIAN.  Iron infusion today  Thank you for choosing [place] at [facility] to provide your oncology and hematology care.  To afford each patient quality time with our provider, please arrive at least 15 minutes before your scheduled appointment time.   Beginning January 23rd 2017 lab work for the [facility] will be done in the  Main lab at [facility] on 1st floor. If you have a lab appointment with the [facility] please come in thru the  Main Entrance and check in at the main information desk  You need to re-schedule your appointment should you arrive 10 or more minutes late.  We strive to give you quality time with our providers, and arriving late affects you and other patients whose appointments are after yours.  Also, if you no show three or more times for appointments you may be dismissed from the clinic at the providers discretion.     Again, thank you for choosing [facility].  Our hope is that these requests will decrease the amount of time that you wait before being seen by our physicians.       _____________________________________________________________  Should you have questions after your visit to [facility], please contact our office at [phone] between the hours of 8:30 a.m. and 4:30 p.m.  Voicemails left after 4:30 p.m. will not be returned until the following business day.  For prescription refill requests, have your pharmacy contact our office.         Resources For Cancer Patients and their Caregivers ? American Cancer Society: Can assist with transportation, wigs, general needs, runs Look Good Feel Better.        801-550-4052 ? Cancer Care: Provides financial assistance, online support groups, medication/co-pay assistance.   1-800-813-HOPE 5176408685) ? [place] Assists [place] cancer patients and their families through emotional , educational and financial support.  (270) 813-9298 ? [place] DSS Where to apply for food stamps, Medicaid and utility assistance. 559-232-8660 ? [employer]: Transportation to medical appointments. 817-816-0612 ? Social Security Administration: May apply for disability if have a Stage IV cancer. 503-597-7555 216-772-8909 ? [employer], Disability and Transit Services: Assists with nutrition, care and transit needs. [phone]

## 2016-03-11 NOTE — Progress Notes (Signed)
..  [name] here today for iron infusion. Tolerated well.

## 2016-03-21 IMAGING — NM NM MYOCAR MULTI W/ SPECT
3 series · 18 of 18 positions shown · non-contrast
Comparison: none

[Series 1: stress_(id)_sa · 6.5mm · 6.51mm/px · 6 of 512 frames shown (1 of 2)]
[frame 43/512]
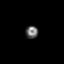
[frame 128/512]
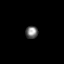
[frame 214/512]
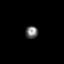
[frame 299/512]
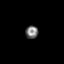
[frame 384/512]
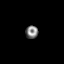
[frame 470/512]
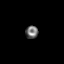

[Series 1: rest_(id)_sa · 6.5mm · 6.51mm/px · 6 of 64 frames shown]
[frame 6/64]
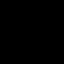
[frame 16/64]
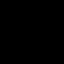
[frame 27/64]
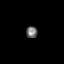
[frame 38/64]
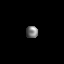
[frame 48/64]
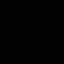
[frame 59/64]
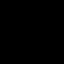

[Series 1: stress_(id)_sa · 6.5mm · 6.51mm/px · 6 of 64 frames shown (2 of 2)]
[frame 6/64]
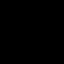
[frame 16/64]
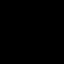
[frame 27/64]
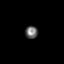
[frame 38/64]
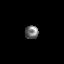
[frame 48/64]
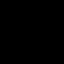
[frame 59/64]
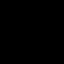

[18 of 18 positions shown; findings below may reference images not displayed]

Canned report from images found in remote index.

Refer to host system for actual result text.

## 2016-03-24 DIAGNOSIS — Z86711 Personal history of pulmonary embolism: Secondary | ICD-10-CM | POA: Diagnosis not present

## 2016-03-24 DIAGNOSIS — E7800 Pure hypercholesterolemia, unspecified: Secondary | ICD-10-CM | POA: Diagnosis not present

## 2016-03-24 DIAGNOSIS — Z95828 Presence of other vascular implants and grafts: Secondary | ICD-10-CM | POA: Diagnosis not present

## 2016-03-24 DIAGNOSIS — Z86718 Personal history of other venous thrombosis and embolism: Secondary | ICD-10-CM | POA: Diagnosis not present

## 2016-03-27 ENCOUNTER — Other Ambulatory Visit: Payer: Self-pay

## 2016-03-27 MED ORDER — LEVOTHYROXINE SODIUM 125 MCG PO TABS: 125.0000 ug | ORAL_TABLET | Freq: Every day | ORAL | Status: DC

## 2016-03-30 DIAGNOSIS — Z86711 Personal history of pulmonary embolism: Secondary | ICD-10-CM | POA: Insufficient documentation

## 2016-03-30 DIAGNOSIS — Z86718 Personal history of other venous thrombosis and embolism: Secondary | ICD-10-CM | POA: Insufficient documentation

## 2016-04-08 ENCOUNTER — Encounter (HOSPITAL_COMMUNITY): Payer: Medicare Other

## 2016-04-08 ENCOUNTER — Ambulatory Visit (HOSPITAL_COMMUNITY): Payer: Medicare Other | Admitting: Oncology

## 2016-04-08 ENCOUNTER — Ambulatory Visit (HOSPITAL_COMMUNITY): Payer: Medicare Other | Admitting: Hematology & Oncology

## 2016-04-10 ENCOUNTER — Other Ambulatory Visit: Payer: Self-pay | Admitting: Family Medicine

## 2016-04-13 ENCOUNTER — Emergency Department (HOSPITAL_COMMUNITY): Payer: No Typology Code available for payment source

## 2016-04-13 ENCOUNTER — Emergency Department (HOSPITAL_COMMUNITY)
Admission: EM | Admit: 2016-04-13 | Discharge: 2016-04-13 | Disposition: A | Discharge status: Home / Self Care | Payer: No Typology Code available for payment source | Attending: Emergency Medicine | Admitting: Emergency Medicine

## 2016-04-13 ENCOUNTER — Encounter (HOSPITAL_COMMUNITY): Payer: Self-pay | Admitting: *Deleted

## 2016-04-13 DIAGNOSIS — S161XXA Strain of muscle, fascia and tendon at neck level, initial encounter: Secondary | ICD-10-CM | POA: Diagnosis not present

## 2016-04-13 DIAGNOSIS — Y999 Unspecified external cause status: Secondary | ICD-10-CM | POA: Insufficient documentation

## 2016-04-13 DIAGNOSIS — Z853 Personal history of malignant neoplasm of breast: Secondary | ICD-10-CM | POA: Diagnosis not present

## 2016-04-13 DIAGNOSIS — T07XXXA Unspecified multiple injuries, initial encounter: Secondary | ICD-10-CM

## 2016-04-13 DIAGNOSIS — S139XXA Sprain of joints and ligaments of unspecified parts of neck, initial encounter: Secondary | ICD-10-CM | POA: Insufficient documentation

## 2016-04-13 DIAGNOSIS — S40011A Contusion of right shoulder, initial encounter: Secondary | ICD-10-CM | POA: Diagnosis not present

## 2016-04-13 DIAGNOSIS — M549 Dorsalgia, unspecified: Secondary | ICD-10-CM | POA: Diagnosis present

## 2016-04-13 DIAGNOSIS — S3991XA Unspecified injury of abdomen, initial encounter: Secondary | ICD-10-CM | POA: Diagnosis not present

## 2016-04-13 DIAGNOSIS — R51 Headache: Secondary | ICD-10-CM | POA: Diagnosis not present

## 2016-04-13 DIAGNOSIS — S1093XA Contusion of unspecified part of neck, initial encounter: Secondary | ICD-10-CM | POA: Diagnosis not present

## 2016-04-13 DIAGNOSIS — Y939 Activity, unspecified: Secondary | ICD-10-CM | POA: Insufficient documentation

## 2016-04-13 DIAGNOSIS — Z79899 Other long term (current) drug therapy: Secondary | ICD-10-CM | POA: Insufficient documentation

## 2016-04-13 DIAGNOSIS — S301XXA Contusion of abdominal wall, initial encounter: Secondary | ICD-10-CM | POA: Insufficient documentation

## 2016-04-13 DIAGNOSIS — R079 Chest pain, unspecified: Secondary | ICD-10-CM | POA: Diagnosis not present

## 2016-04-13 DIAGNOSIS — S3993XA Unspecified injury of pelvis, initial encounter: Secondary | ICD-10-CM | POA: Diagnosis not present

## 2016-04-13 DIAGNOSIS — E7800 Pure hypercholesterolemia, unspecified: Secondary | ICD-10-CM | POA: Insufficient documentation

## 2016-04-13 DIAGNOSIS — S20229A Contusion of unspecified back wall of thorax, initial encounter: Secondary | ICD-10-CM | POA: Diagnosis not present

## 2016-04-13 DIAGNOSIS — F329 Major depressive disorder, single episode, unspecified: Secondary | ICD-10-CM | POA: Insufficient documentation

## 2016-04-13 DIAGNOSIS — Y929 Unspecified place or not applicable: Secondary | ICD-10-CM | POA: Diagnosis not present

## 2016-04-13 DIAGNOSIS — M542 Cervicalgia: Secondary | ICD-10-CM | POA: Diagnosis not present

## 2016-04-13 DIAGNOSIS — M25511 Pain in right shoulder: Secondary | ICD-10-CM | POA: Diagnosis not present

## 2016-04-13 LAB — CBC WITH DIFFERENTIAL/PLATELET
Basophils Absolute: 0 10*3/uL (ref 0.0–0.1)
Basophils Relative: 0 %
EOS ABS: 0.1 10*3/uL (ref 0.0–0.7)
EOS PCT: 1 %
HCT: 42.3 % (ref 36.0–46.0)
Hemoglobin: 14.5 g/dL (ref 12.0–15.0)
LYMPHS ABS: 2.7 10*3/uL (ref 0.7–4.0)
Lymphocytes Relative: 32 %
MCH: 30.7 pg (ref 26.0–34.0)
MCHC: 34.3 g/dL (ref 30.0–36.0)
MCV: 89.4 fL (ref 78.0–100.0)
Monocytes Absolute: 0.6 10*3/uL (ref 0.1–1.0)
Monocytes Relative: 7 %
Neutro Abs: 5.2 10*3/uL (ref 1.7–7.7)
Neutrophils Relative %: 60 %
PLATELETS: 200 10*3/uL (ref 150–400)
RBC: 4.73 MIL/uL (ref 3.87–5.11)
RDW: 13 % (ref 11.5–15.5)
WBC: 8.5 10*3/uL (ref 4.0–10.5)

## 2016-04-13 LAB — BASIC METABOLIC PANEL
Anion gap: 11 (ref 5–15)
BUN: 23 mg/dL — AB (ref 6–20)
CO2: 23 mmol/L (ref 22–32)
CREATININE: 0.97 mg/dL (ref 0.44–1.00)
Calcium: 9.4 mg/dL (ref 8.9–10.3)
Chloride: 106 mmol/L (ref 101–111)
GFR calc Af Amer: >60 mL/min (ref >60)
Glucose, Bld: 100 mg/dL — ABNORMAL HIGH (ref 65–99)
POTASSIUM: 4.1 mmol/L (ref 3.5–5.1)
SODIUM: 140 mmol/L (ref 135–145)

## 2016-04-13 LAB — URINALYSIS, ROUTINE W REFLEX MICROSCOPIC
BILIRUBIN URINE: NEGATIVE
Glucose, UA: NEGATIVE mg/dL
HGB URINE DIPSTICK: NEGATIVE
KETONES UR: NEGATIVE mg/dL
Leukocytes, UA: NEGATIVE
Nitrite: NEGATIVE
PROTEIN: NEGATIVE mg/dL
Specific Gravity, Urine: >1.03 — ABNORMAL HIGH (ref 1.005–1.030)
pH: 6 (ref 5.0–8.0)

## 2016-04-13 LAB — PREGNANCY, URINE: PREG TEST UR: NEGATIVE

## 2016-04-13 MED ORDER — NAPROXEN 500 MG PO TABS: 500.0000 mg | ORAL_TABLET | Freq: Two times a day (BID) | ORAL | Status: DC

## 2016-04-13 MED ORDER — IOPAMIDOL (ISOVUE-300) INJECTION 61%
100.0000 mL | Freq: Once | INTRAVENOUS | Status: AC | PRN
  Administered 2016-04-13: 100 mL via INTRAVENOUS

## 2016-04-13 NOTE — ED Notes (Signed)
Patient ambulatory out of room around nurses station
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
## 2016-04-13 NOTE — ED Notes (Signed)
Patient verbalizes understanding of discharge instructions, prescription medications, home care and follow up care. Patient out of department at this time with spouse. 
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
## 2016-04-13 NOTE — Discharge Instructions (Signed)

## 2016-04-15 ENCOUNTER — Telehealth: Payer: Self-pay | Admitting: Family Medicine

## 2016-04-15 ENCOUNTER — Encounter (HOSPITAL_COMMUNITY): Payer: Medicare Other

## 2016-04-15 ENCOUNTER — Ambulatory Visit (HOSPITAL_COMMUNITY): Payer: Medicare Other | Admitting: Oncology

## 2016-04-15 DIAGNOSIS — M542 Cervicalgia: Secondary | ICD-10-CM

## 2016-04-15 NOTE — Telephone Encounter (Signed)
Ok to refer re eval inc neck pain ,I will sign

## 2016-04-15 NOTE — Telephone Encounter (Signed)
[name] is calling stating that she is having a lot of neck pain and she is asking if Dr. Moshe Cipro would refer her back to Dr. Ellene Route, please advise?

## 2016-04-16 NOTE — Telephone Encounter (Signed)
Referral entered  

## 2016-04-16 NOTE — Addendum Note (Signed)
Addended by: Eual Fines on: 04/16/2016 08:18 AM   Modules accepted: Orders

## 2016-04-20 DIAGNOSIS — R109 Unspecified abdominal pain: Secondary | ICD-10-CM | POA: Diagnosis not present

## 2016-04-20 DIAGNOSIS — R1084 Generalized abdominal pain: Secondary | ICD-10-CM | POA: Diagnosis not present

## 2016-04-21 ENCOUNTER — Encounter (HOSPITAL_COMMUNITY): Payer: Medicare Other

## 2016-04-21 ENCOUNTER — Ambulatory Visit (HOSPITAL_COMMUNITY): Payer: Medicare Other | Admitting: Oncology

## 2016-04-22 DIAGNOSIS — Z79899 Other long term (current) drug therapy: Secondary | ICD-10-CM | POA: Diagnosis not present

## 2016-04-22 DIAGNOSIS — M5416 Radiculopathy, lumbar region: Secondary | ICD-10-CM | POA: Diagnosis not present

## 2016-04-22 DIAGNOSIS — M4806 Spinal stenosis, lumbar region: Secondary | ICD-10-CM | POA: Diagnosis not present

## 2016-04-23 DIAGNOSIS — L03113 Cellulitis of right upper limb: Secondary | ICD-10-CM | POA: Diagnosis not present

## 2016-04-23 DIAGNOSIS — Z90710 Acquired absence of both cervix and uterus: Secondary | ICD-10-CM | POA: Diagnosis not present

## 2016-04-23 DIAGNOSIS — Z79899 Other long term (current) drug therapy: Secondary | ICD-10-CM | POA: Diagnosis not present

## 2016-04-23 DIAGNOSIS — Z853 Personal history of malignant neoplasm of breast: Secondary | ICD-10-CM | POA: Diagnosis not present

## 2016-04-23 DIAGNOSIS — Z9012 Acquired absence of left breast and nipple: Secondary | ICD-10-CM | POA: Diagnosis not present

## 2016-04-23 DIAGNOSIS — M79631 Pain in right forearm: Secondary | ICD-10-CM | POA: Diagnosis not present

## 2016-04-23 DIAGNOSIS — M797 Fibromyalgia: Secondary | ICD-10-CM | POA: Diagnosis not present

## 2016-04-23 DIAGNOSIS — Z86718 Personal history of other venous thrombosis and embolism: Secondary | ICD-10-CM | POA: Diagnosis not present

## 2016-04-23 DIAGNOSIS — M7989 Other specified soft tissue disorders: Secondary | ICD-10-CM | POA: Diagnosis not present

## 2016-04-23 DIAGNOSIS — M79601 Pain in right arm: Secondary | ICD-10-CM | POA: Diagnosis not present

## 2016-04-24 DIAGNOSIS — L03113 Cellulitis of right upper limb: Secondary | ICD-10-CM | POA: Diagnosis not present

## 2016-04-24 DIAGNOSIS — M79621 Pain in right upper arm: Secondary | ICD-10-CM | POA: Diagnosis not present

## 2016-05-12 ENCOUNTER — Other Ambulatory Visit: Payer: Self-pay

## 2016-05-12 DIAGNOSIS — D509 Iron deficiency anemia, unspecified: Secondary | ICD-10-CM

## 2016-05-12 DIAGNOSIS — R790 Abnormal level of blood mineral: Secondary | ICD-10-CM

## 2016-05-12 MED ORDER — FENOFIBRATE 145 MG PO TABS: 145.0000 mg | ORAL_TABLET | Freq: Every day | ORAL | Status: DC

## 2016-05-12 MED ORDER — PRAVASTATIN SODIUM 80 MG PO TABS: 80.0000 mg | ORAL_TABLET | Freq: Every day | ORAL | Status: DC

## 2016-05-12 MED ORDER — CYCLOBENZAPRINE HCL 10 MG PO TABS: 10.0000 mg | ORAL_TABLET | Freq: Two times a day (BID) | ORAL | Status: DC | PRN

## 2016-05-12 NOTE — Assessment & Plan Note (Addendum)
Secondary to malabsorption from multiple intestinal surgeries versus chronic GI blood loss.  She has a complicated GI history and this is followed at [facility].    Oncology Flowsheet 03/11/2016  ferric gluconate (NULECIT) IV 125 mg   Labs today: CBC diff, iron/TIBC, ferritin, CMET  Labs every 6 weeks as ordered: CBC diff, CMET, iron/TIBC, ferritin.  She requests a Rx for "real nature hair wig" as she is losing her hair.  She has a full head of hair without any noted obvious alopecia.  Rx for cranial prosthesis provided, however, I do not think her insurance will cover this from an aesthetic standpoint; they may cover a typical wig.  I will ask for an appointment for her to visit our [place] as she is interested in viewing some wig options.  However, she is not a candidate to have/use one of our wigs given that she is not on chemotherapy and therefore, does not have an oncology reason to use our cranial prostheses that are reserved for cancer patients.  However, she may view our selection for ideas.  She asks about hormone replacement therapy and I will defer that conversation to her primary care provider and/or gynecology.  Return in 6 months for follow-up.
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
## 2016-05-13 ENCOUNTER — Other Ambulatory Visit: Payer: Self-pay

## 2016-05-13 ENCOUNTER — Encounter (HOSPITAL_COMMUNITY): Payer: Medicare Other | Attending: Oncology | Admitting: Oncology

## 2016-05-13 ENCOUNTER — Encounter (HOSPITAL_COMMUNITY): Payer: Self-pay | Admitting: Oncology

## 2016-05-13 ENCOUNTER — Ambulatory Visit: Payer: Medicare Other | Admitting: Family Medicine

## 2016-05-13 ENCOUNTER — Encounter (HOSPITAL_COMMUNITY): Payer: Medicare Other | Attending: Hematology & Oncology

## 2016-05-13 VITALS — BP 108/90 | HR 100 | Temp 98.0°F | Resp 18 | Wt 230.0 lb

## 2016-05-13 DIAGNOSIS — D509 Iron deficiency anemia, unspecified: Secondary | ICD-10-CM | POA: Insufficient documentation

## 2016-05-13 DIAGNOSIS — Z853 Personal history of malignant neoplasm of breast: Secondary | ICD-10-CM | POA: Diagnosis not present

## 2016-05-13 DIAGNOSIS — R790 Abnormal level of blood mineral: Secondary | ICD-10-CM

## 2016-05-13 DIAGNOSIS — C50012 Malignant neoplasm of nipple and areola, left female breast: Secondary | ICD-10-CM

## 2016-05-13 LAB — COMPREHENSIVE METABOLIC PANEL
ALK PHOS: 69 U/L (ref 38–126)
ALT: 37 U/L (ref 14–54)
ANION GAP: 8 (ref 5–15)
AST: 39 U/L (ref 15–41)
Albumin: 4.2 g/dL (ref 3.5–5.0)
BUN: 25 mg/dL — ABNORMAL HIGH (ref 6–20)
CALCIUM: 9.2 mg/dL (ref 8.9–10.3)
CO2: 25 mmol/L (ref 22–32)
CREATININE: 0.89 mg/dL (ref 0.44–1.00)
Chloride: 105 mmol/L (ref 101–111)
Glucose, Bld: 108 mg/dL — ABNORMAL HIGH (ref 65–99)
Potassium: 3.9 mmol/L (ref 3.5–5.1)
Sodium: 138 mmol/L (ref 135–145)
Total Bilirubin: 0.5 mg/dL (ref 0.3–1.2)
Total Protein: 6.9 g/dL (ref 6.5–8.1)

## 2016-05-13 LAB — CBC WITH DIFFERENTIAL/PLATELET
Basophils Absolute: 0 10*3/uL (ref 0.0–0.1)
Basophils Relative: 0 %
EOS PCT: 2 %
Eosinophils Absolute: 0.1 10*3/uL (ref 0.0–0.7)
HCT: 40.4 % (ref 36.0–46.0)
Hemoglobin: 13.5 g/dL (ref 12.0–15.0)
LYMPHS ABS: 1.7 10*3/uL (ref 0.7–4.0)
LYMPHS PCT: 37 %
MCH: 30.2 pg (ref 26.0–34.0)
MCHC: 33.4 g/dL (ref 30.0–36.0)
MCV: 90.4 fL (ref 78.0–100.0)
MONOS PCT: 6 %
Monocytes Absolute: 0.3 10*3/uL (ref 0.1–1.0)
Neutro Abs: 2.5 10*3/uL (ref 1.7–7.7)
Neutrophils Relative %: 55 %
PLATELETS: 202 10*3/uL (ref 150–400)
RBC: 4.47 MIL/uL (ref 3.87–5.11)
RDW: 13.2 % (ref 11.5–15.5)
WBC: 4.6 10*3/uL (ref 4.0–10.5)

## 2016-05-13 NOTE — Patient Instructions (Addendum)
[place] at [facility] Discharge Instructions  RECOMMENDATIONS MADE BY THE CONSULTANT AND ANY TEST RESULTS WILL BE SENT TO YOUR REFERRING PHYSICIAN.  Exam and discussion today with Kirby Crigler, PA. Prescriptions given for mastectomy supplies and wigs. Lab work every 6 weeks. Return in 6 months for office visit with Dr. Whitney Muse.   Thank you for choosing [place] at [facility] to provide your oncology and hematology care.  To afford each patient quality time with our provider, please arrive at least 15 minutes before your scheduled appointment time.   Beginning January 23rd 2017 lab work for the [facility] will be done in the  Main lab at [facility] on 1st floor. If you have a lab appointment with the [facility] please come in thru the  Main Entrance and check in at the main information desk  You need to re-schedule your appointment should you arrive 10 or more minutes late.  We strive to give you quality time with our providers, and arriving late affects you and other patients whose appointments are after yours.  Also, if you no show three or more times for appointments you may be dismissed from the clinic at the providers discretion.     Again, thank you for choosing [facility].  Our hope is that these requests will decrease the amount of time that you wait before being seen by our physicians.       _____________________________________________________________  Should you have questions after your visit to [facility], please contact our office at [phone] between the hours of 8:30 a.m. and 4:30 p.m.  Voicemails left after 4:30 p.m. will not be returned until the following business day.  For prescription refill requests, have your pharmacy contact our office.         Resources For Cancer Patients and their Caregivers ? American Cancer Society: Can assist with transportation, wigs, general needs,  runs Look Good Feel Better.        848-295-4571 ? Cancer Care: Provides financial assistance, online support groups, medication/co-pay assistance.  1-800-813-HOPE 706-877-4855) ? [facility] Assists [place] cancer patients and their families through emotional , educational and financial support.  520 056 5778 ? [place] DSS Where to apply for food stamps, Medicaid and utility assistance. 424-049-1035 ? [facility]: Transportation to medical appointments. (339) 645-5351 ? Social Security Administration: May apply for disability if have a Stage IV cancer. 425-362-0191 (418) 794-9780 ? [employer], Disability and Transit Services: Assists with nutrition, care and transit needs. [place] Support Programs: @10RELATIVEDAYS @ > Cancer Support Group  2nd Tuesday of the month 1pm-2pm, Journey Room  > Creative Journey  3rd Tuesday of the month 1130am-1pm, Journey Room  > Look Good Feel Better  1st Wednesday of the month 10am-12 noon, Journey Room (Call [place] to register [phone])

## 2016-05-13 NOTE — Assessment & Plan Note (Signed)
History of Paget's disease of the left breast status post resection with breast reconstruction with a left sided TRAM flap. This was done at [facility] with her initial diagnosis of Paget's disease in 2006.   Rx provided for mastectomy supplies, including mastectomy bras and prostheses.

## 2016-05-14 ENCOUNTER — Encounter (HOSPITAL_COMMUNITY): Payer: Self-pay | Admitting: *Deleted

## 2016-05-14 LAB — IRON AND TIBC
IRON: 83 ug/dL (ref 28–170)
SATURATION RATIOS: 21 % (ref 10.4–31.8)
TIBC: 405 ug/dL (ref 250–450)
UIBC: 322 ug/dL

## 2016-05-14 LAB — FERRITIN: FERRITIN: 105 ng/mL (ref 11–307)

## 2016-05-18 ENCOUNTER — Telehealth: Payer: Self-pay

## 2016-05-18 DIAGNOSIS — E785 Hyperlipidemia, unspecified: Secondary | ICD-10-CM

## 2016-05-18 DIAGNOSIS — R197 Diarrhea, unspecified: Secondary | ICD-10-CM

## 2016-05-18 DIAGNOSIS — R7302 Impaired glucose tolerance (oral): Secondary | ICD-10-CM

## 2016-05-18 DIAGNOSIS — E039 Hypothyroidism, unspecified: Secondary | ICD-10-CM

## 2016-05-18 NOTE — Telephone Encounter (Signed)
Labs ordered and specimen kit for collection

## 2016-05-21 ENCOUNTER — Telehealth: Payer: Self-pay | Admitting: Family Medicine

## 2016-05-21 NOTE — Telephone Encounter (Signed)
[name] is calling for Lab Results, please advise?

## 2016-05-21 NOTE — Telephone Encounter (Signed)
C dif negative, normal thyroid function, TG 184 (H) LDL 102, HBA1`C 5.7 (h)

## 2016-05-22 NOTE — Telephone Encounter (Signed)
Patient aware.

## 2016-05-27 DIAGNOSIS — M5416 Radiculopathy, lumbar region: Secondary | ICD-10-CM | POA: Diagnosis not present

## 2016-05-27 DIAGNOSIS — M4806 Spinal stenosis, lumbar region: Secondary | ICD-10-CM | POA: Diagnosis not present

## 2016-05-29 DIAGNOSIS — G43711 Chronic migraine without aura, intractable, with status migrainosus: Secondary | ICD-10-CM | POA: Diagnosis not present

## 2016-06-02 ENCOUNTER — Ambulatory Visit: Payer: Medicare Other | Admitting: Family Medicine

## 2016-06-04 DIAGNOSIS — Z6839 Body mass index (BMI) 39.0-39.9, adult: Secondary | ICD-10-CM | POA: Diagnosis not present

## 2016-06-04 DIAGNOSIS — M5416 Radiculopathy, lumbar region: Secondary | ICD-10-CM | POA: Diagnosis not present

## 2016-06-05 ENCOUNTER — Other Ambulatory Visit: Payer: Self-pay | Admitting: Neurological Surgery

## 2016-06-05 DIAGNOSIS — M5416 Radiculopathy, lumbar region: Secondary | ICD-10-CM

## 2016-06-09 ENCOUNTER — Ambulatory Visit (INDEPENDENT_AMBULATORY_CARE_PROVIDER_SITE_OTHER): Payer: Medicare Other | Admitting: Family Medicine

## 2016-06-09 ENCOUNTER — Encounter: Payer: Self-pay | Admitting: Family Medicine

## 2016-06-09 VITALS — BP 130/72 | HR 96 | Resp 18 | Ht 64.0 in | Wt 227.0 lb

## 2016-06-09 DIAGNOSIS — K219 Gastro-esophageal reflux disease without esophagitis: Secondary | ICD-10-CM

## 2016-06-09 DIAGNOSIS — R51 Headache: Secondary | ICD-10-CM

## 2016-06-09 DIAGNOSIS — E785 Hyperlipidemia, unspecified: Secondary | ICD-10-CM

## 2016-06-09 DIAGNOSIS — R7302 Impaired glucose tolerance (oral): Secondary | ICD-10-CM

## 2016-06-09 DIAGNOSIS — E039 Hypothyroidism, unspecified: Secondary | ICD-10-CM | POA: Diagnosis not present

## 2016-06-09 DIAGNOSIS — R000 Tachycardia, unspecified: Secondary | ICD-10-CM

## 2016-06-09 DIAGNOSIS — E8881 Metabolic syndrome: Secondary | ICD-10-CM

## 2016-06-09 DIAGNOSIS — D509 Iron deficiency anemia, unspecified: Secondary | ICD-10-CM | POA: Diagnosis not present

## 2016-06-09 DIAGNOSIS — E559 Vitamin D deficiency, unspecified: Secondary | ICD-10-CM

## 2016-06-09 DIAGNOSIS — R519 Headache, unspecified: Secondary | ICD-10-CM

## 2016-06-09 DIAGNOSIS — M5440 Lumbago with sciatica, unspecified side: Secondary | ICD-10-CM

## 2016-06-09 DIAGNOSIS — F411 Generalized anxiety disorder: Secondary | ICD-10-CM

## 2016-06-09 NOTE — Progress Notes (Signed)
Subjective:    Patient ID: [name], female    DOB: 1961/03/01, 55 y.o.   MRN: [id number]  HPI    [name]     MRN: [id number]      DOB: [birth year]   HPI [name] is here for follow up and re-evaluation of chronic medical conditions, medication management and review of any available recent lab and radiology data.  Preventive health is updated, specifically  Cancer screening and Immunization.   Questions or concerns regarding consultations or procedures which the PT has had in the interim are  Addressed.Has upcoming appt in [place] area with vascular surgeon to re assess her IVC filter Also has had neurosurg eval of her chronic neck pain, no surgery proposed The PT denies any adverse reactions to current medications since the last visit.  Recent frequent loose stools, with some skin irritation in the area, but improving Increased marital stress which she is working through currently  ROS Denies recent fever or chills. Denies sinus pressure, nasal congestion, ear pain or sore throat. Denies chest congestion, productive cough or wheezing. Denies chest pains, palpitations and leg swelling Denies abdominal pain, nausea, vomiting,diarrhea or constipation.   Denies dysuria, frequency, hesitancy or incontinence. Denies uncontrolled  joint pain, swelling and limitation in mobility. Denies headaches, seizures, numbness, or tingling. Denies depression, uncontrolled anxiety or insomnia. Denies skin break down , has perianal irritation due to frequent loose stool but has been using topical preparation with success.   PE  BP 130/72 mmHg  Pulse 96  Resp 18  Ht 5\' 4"  (1.626 m)  Wt 227 lb (102.967 kg)  BMI 38.95 kg/m2  SpO2 95%  Patient alert and oriented and in no cardiopulmonary distress.  HEENT: No facial asymmetry, EOMI,   oropharynx pink and moist.  Neck supple no JVD, no mass.  Chest: Clear to auscultation bilaterally.  CVS: S1, S2 no murmurs, no  S3.Regular rate.  ABD: Soft non tender.   Ext: No edema  MS: Adequate ROM spine, shoulders, hips and knees.  Skin: Intact, no ulcerations or rash noted.  Psych: Good eye contact, normal affect. Memory intact not anxious or depressed appearing.  CNS: CN 2-12 intact, power,  normal throughout.no focal deficits noted.   Assessment & Plan   Hypothyroidism Controlled, no change in medication Updated lab needed at/ before next visit.   Tachycardia Controlled, no change in medication   Backache Controlled with cymbalta and flexeril, continue same  Hyperlipidemia LDL goal <100 Controlled, no change in medication Hyperlipidemia:Low fat diet discussed and encouraged.         Metabolic syndrome X The increased risk of cardiovascular disease associated with this diagnosis, and the need to consistently work on lifestyle to change this is discussed. Following  a  heart healthy diet ,commitment to 30 minutes of exercise at least 5 days per week, as well as control of blood sugar and cholesterol , and achieving a healthy weight are all the areas to be addressed .   Morbid obesity Improved, 5 pounds ; less than last visit Patient re-educated about  the importance of commitment to a  minimum of 150 minutes of exercise per week.  The importance of healthy food choices with portion control discussed. Encouraged to start a food diary, count calories and to consider  joining a support group. Sample diet sheets offered. Goals set by the patient for the next several months.   Weight /BMI 06/09/2016 05/13/2016 04/13/2016  WEIGHT 227 lb 230 lb 199  lb  HEIGHT 5\' 4"  - -  BMI 38.95 kg/m2 39.46 kg/m2 34.14 kg/m2       Anxiety state Controlled, no change in medication   Headache disorder Controlled, no change in medication   GERD (gastroesophageal reflux disease) Controlled, no change in medication       Review of Systems     Objective:   Physical Exam          Assessment & Plan:

## 2016-06-09 NOTE — Patient Instructions (Addendum)
Annual physical exam in 4 month, call if you need me before  Thankful  You are getting the help that you need  Labs are excellent  No med changes at this time  Fasting lipid, cmp and EGFr, HBA1C and TSh and vit D  in 4 month  Thank you  for choosing [facility]. We consider it a privelige to serve you.  Delivering excellent health care in a caring and  compassionate way is our goal.  Partnering with you,  so that together we can achieve this goal is our strategy.     Please work on good  health habits so that your health will improve. 1. Commitment to daily physical activity for 30 to 60  minutes, if you are able to do this.  2. Commitment to wise food choices. Aim for half of your  food intake to be vegetable and fruit, one quarter starchy foods, and one quarter protein. Try to eat on a regular schedule  3 meals per day, snacking between meals should be limited to vegetables or fruits or small portions of nuts. 64 ounces of water per day is generally recommended, unless you have specific health conditions, like heart failure or kidney failure where you will need to limit fluid intake.  3. Commitment to sufficient and a  good quality of physical and mental rest daily, generally between 6 to 8 hours per day.  WITH PERSISTANCE AND PERSEVERANCE, THE IMPOSSIBLE , BECOMES THE NORM!

## 2016-06-18 ENCOUNTER — Other Ambulatory Visit: Payer: Medicare Other

## 2016-06-18 ENCOUNTER — Telehealth: Payer: Self-pay | Admitting: Family Medicine

## 2016-06-18 MED ORDER — FIRST-DUKES MOUTHWASH MT SUSP: OROMUCOSAL | Status: DC

## 2016-06-18 NOTE — Telephone Encounter (Signed)
Do you agree?

## 2016-06-18 NOTE — Telephone Encounter (Signed)
Sent to pharmacy pls let her know

## 2016-06-18 NOTE — Telephone Encounter (Signed)
[name] is stating that she has more mouth ulcers and needs Dukes Mixture, due to bowel issues, sent to [facility]

## 2016-06-22 ENCOUNTER — Ambulatory Visit
Admission: RE | Admit: 2016-06-22 | Discharge: 2016-06-22 | Disposition: A | Discharge status: Home / Self Care | Payer: Medicare Other | Source: Ambulatory Visit | Attending: Neurological Surgery | Admitting: Neurological Surgery

## 2016-06-22 DIAGNOSIS — M5126 Other intervertebral disc displacement, lumbar region: Secondary | ICD-10-CM | POA: Diagnosis not present

## 2016-06-22 DIAGNOSIS — M5416 Radiculopathy, lumbar region: Secondary | ICD-10-CM

## 2016-06-22 NOTE — Assessment & Plan Note (Signed)
Controlled, no change in medication  

## 2016-06-22 NOTE — Assessment & Plan Note (Addendum)
Improved, 5 pounds ; less than last visit Patient re-educated about  the importance of commitment to a  minimum of 150 minutes of exercise per week.  The importance of healthy food choices with portion control discussed. Encouraged to start a food diary, count calories and to consider  joining a support group. Sample diet sheets offered. Goals set by the patient for the next several months.   Weight /BMI 06/09/2016 05/13/2016 04/13/2016  WEIGHT 227 lb 230 lb 199 lb  HEIGHT 5\' 4"  - -  BMI 38.95 kg/m2 39.46 kg/m2 34.14 kg/m2

## 2016-06-22 NOTE — Assessment & Plan Note (Signed)
Controlled, no change in medication Hyperlipidemia:Low fat diet discussed and encouraged.  \ 

## 2016-06-22 NOTE — Assessment & Plan Note (Signed)
The increased risk of cardiovascular disease associated with this diagnosis, and the need to consistently work on lifestyle to change this is discussed. Following  a  heart healthy diet ,commitment to 30 minutes of exercise at least 5 days per week, as well as control of blood sugar and cholesterol , and achieving a healthy weight are all the areas to be addressed .  

## 2016-06-22 NOTE — Assessment & Plan Note (Signed)
Controlled with cymbalta and flexeril, continue same

## 2016-06-22 NOTE — Assessment & Plan Note (Signed)
Controlled, no change in medication Updated lab needed at/ before next visit.  

## 2016-06-23 ENCOUNTER — Inpatient Hospital Stay: Admission: RE | Admit: 2016-06-23 | Payer: Medicare Other | Source: Ambulatory Visit

## 2016-06-24 ENCOUNTER — Encounter (HOSPITAL_COMMUNITY): Payer: Medicare Other | Attending: Hematology & Oncology

## 2016-06-24 DIAGNOSIS — D509 Iron deficiency anemia, unspecified: Secondary | ICD-10-CM | POA: Diagnosis not present

## 2016-06-24 DIAGNOSIS — R790 Abnormal level of blood mineral: Secondary | ICD-10-CM

## 2016-06-24 LAB — CBC WITH DIFFERENTIAL/PLATELET
BASOS ABS: 0 10*3/uL (ref 0.0–0.1)
BASOS PCT: 0 %
EOS ABS: 0.1 10*3/uL (ref 0.0–0.7)
Eosinophils Relative: 2 %
HEMATOCRIT: 41.9 % (ref 36.0–46.0)
HEMOGLOBIN: 14.1 g/dL (ref 12.0–15.0)
Lymphocytes Relative: 37 %
Lymphs Abs: 2.2 10*3/uL (ref 0.7–4.0)
MCH: 30.5 pg (ref 26.0–34.0)
MCHC: 33.7 g/dL (ref 30.0–36.0)
MCV: 90.5 fL (ref 78.0–100.0)
Monocytes Absolute: 0.3 10*3/uL (ref 0.1–1.0)
Monocytes Relative: 6 %
NEUTROS ABS: 3.2 10*3/uL (ref 1.7–7.7)
NEUTROS PCT: 55 %
Platelets: 185 10*3/uL (ref 150–400)
RBC: 4.63 MIL/uL (ref 3.87–5.11)
RDW: 13.6 % (ref 11.5–15.5)
WBC: 5.8 10*3/uL (ref 4.0–10.5)

## 2016-06-24 LAB — COMPREHENSIVE METABOLIC PANEL
ALBUMIN: 4.3 g/dL (ref 3.5–5.0)
ALK PHOS: 57 U/L (ref 38–126)
ALT: 36 U/L (ref 14–54)
ANION GAP: 7 (ref 5–15)
AST: 42 U/L — AB (ref 15–41)
BILIRUBIN TOTAL: 0.7 mg/dL (ref 0.3–1.2)
BUN: 20 mg/dL (ref 6–20)
CALCIUM: 9.1 mg/dL (ref 8.9–10.3)
CO2: 26 mmol/L (ref 22–32)
Chloride: 105 mmol/L (ref 101–111)
Creatinine, Ser: 0.82 mg/dL (ref 0.44–1.00)
GFR calc Af Amer: >60 mL/min (ref >60)
GFR calc non Af Amer: >60 mL/min (ref >60)
GLUCOSE: 99 mg/dL (ref 65–99)
Potassium: 4.1 mmol/L (ref 3.5–5.1)
SODIUM: 138 mmol/L (ref 135–145)
Total Protein: 7.1 g/dL (ref 6.5–8.1)

## 2016-06-24 LAB — IRON AND TIBC
Iron: 143 ug/dL (ref 28–170)
Saturation Ratios: 31 % (ref 10.4–31.8)
TIBC: 463 ug/dL — AB (ref 250–450)
UIBC: 320 ug/dL

## 2016-06-24 LAB — FERRITIN: Ferritin: 129 ng/mL (ref 11–307)

## 2016-06-25 DIAGNOSIS — M5416 Radiculopathy, lumbar region: Secondary | ICD-10-CM | POA: Diagnosis not present

## 2016-06-25 DIAGNOSIS — R030 Elevated blood-pressure reading, without diagnosis of hypertension: Secondary | ICD-10-CM | POA: Diagnosis not present

## 2016-06-25 DIAGNOSIS — Z6838 Body mass index (BMI) 38.0-38.9, adult: Secondary | ICD-10-CM | POA: Diagnosis not present

## 2016-07-14 ENCOUNTER — Telehealth: Payer: Self-pay | Admitting: Family Medicine

## 2016-07-14 NOTE — Telephone Encounter (Signed)
Can she be prescribed Terbinafine?

## 2016-07-14 NOTE — Telephone Encounter (Signed)
slight elevation in one of her liver enzymes in June so I recommend OTC antifungal topically instead, also vicks vapor rub daily to toenail will work

## 2016-07-14 NOTE — Telephone Encounter (Signed)
[name] called asking if Dr. Moshe Cipro would call her in an Oral Antifungal Medication she has developed a fungus under her Lft Big Toenail. Please advise?

## 2016-07-15 NOTE — Telephone Encounter (Signed)
Patient aware.

## 2016-07-15 NOTE — Telephone Encounter (Signed)
Called patient and left message for them to return call at the office   

## 2016-07-16 ENCOUNTER — Telehealth: Payer: Self-pay | Admitting: Family Medicine

## 2016-07-16 NOTE — Telephone Encounter (Signed)
Opened in Error.

## 2016-07-18 ENCOUNTER — Other Ambulatory Visit: Payer: Self-pay | Admitting: Family Medicine

## 2016-07-22 DIAGNOSIS — M4806 Spinal stenosis, lumbar region: Secondary | ICD-10-CM | POA: Diagnosis not present

## 2016-07-22 DIAGNOSIS — M5416 Radiculopathy, lumbar region: Secondary | ICD-10-CM | POA: Diagnosis not present

## 2016-07-27 ENCOUNTER — Other Ambulatory Visit: Payer: Self-pay

## 2016-07-27 ENCOUNTER — Encounter: Payer: Self-pay | Admitting: Family Medicine

## 2016-07-27 ENCOUNTER — Ambulatory Visit (INDEPENDENT_AMBULATORY_CARE_PROVIDER_SITE_OTHER): Payer: Medicare Other | Admitting: Family Medicine

## 2016-07-27 VITALS — BP 118/80 | HR 76 | Resp 18 | Ht 64.0 in | Wt 230.0 lb

## 2016-07-27 DIAGNOSIS — D509 Iron deficiency anemia, unspecified: Secondary | ICD-10-CM

## 2016-07-27 DIAGNOSIS — M79641 Pain in right hand: Secondary | ICD-10-CM | POA: Diagnosis not present

## 2016-07-27 DIAGNOSIS — R229 Localized swelling, mass and lump, unspecified: Secondary | ICD-10-CM

## 2016-07-27 DIAGNOSIS — E785 Hyperlipidemia, unspecified: Secondary | ICD-10-CM

## 2016-07-27 DIAGNOSIS — E038 Other specified hypothyroidism: Secondary | ICD-10-CM

## 2016-07-27 DIAGNOSIS — R790 Abnormal level of blood mineral: Secondary | ICD-10-CM

## 2016-07-27 DIAGNOSIS — M79642 Pain in left hand: Secondary | ICD-10-CM

## 2016-07-27 HISTORY — DX: Pain in right hand: M79.641

## 2016-07-27 HISTORY — DX: Pain in left hand: M79.642

## 2016-07-27 MED ORDER — PRAVASTATIN SODIUM 80 MG PO TABS: 80.0000 mg | ORAL_TABLET | Freq: Every day | ORAL | 1 refills | Status: DC

## 2016-07-27 MED ORDER — NEXIUM 40 MG PO CPDR: 40.0000 mg | DELAYED_RELEASE_CAPSULE | Freq: Two times a day (BID) | ORAL | 1 refills | Status: DC

## 2016-07-27 MED ORDER — ALPRAZOLAM 1 MG PO TABS
1.0000 mg | ORAL_TABLET | Freq: Two times a day (BID) | ORAL | 3 refills | Status: DC
Start: 2016-07-27 — End: 2016-11-25

## 2016-07-27 MED ORDER — LEVOTHYROXINE SODIUM 125 MCG PO TABS: 125.0000 ug | ORAL_TABLET | Freq: Every day | ORAL | 0 refills | Status: DC

## 2016-07-27 MED ORDER — DULOXETINE HCL 30 MG PO CPEP: 30.0000 mg | ORAL_CAPSULE | Freq: Two times a day (BID) | ORAL | 1 refills | Status: DC

## 2016-07-27 MED ORDER — VERAPAMIL HCL 120 MG PO TABS: 120.0000 mg | ORAL_TABLET | Freq: Two times a day (BID) | ORAL | 1 refills | Status: DC

## 2016-07-27 MED ORDER — CYCLOBENZAPRINE HCL 10 MG PO TABS: 10.0000 mg | ORAL_TABLET | Freq: Two times a day (BID) | ORAL | 1 refills | Status: DC | PRN

## 2016-07-27 NOTE — Assessment & Plan Note (Addendum)
Bilateral hand pain with nodules on fingers, requires  further evaluation by rheumatology , for definitive diagnosis and management

## 2016-07-27 NOTE — Patient Instructions (Signed)
Annual exam as before , call if you need me sooner.  You are referred to rheumatologist to evaluate and treat bilateral hand pain and stiffness, with nodules , we will call wit appointment info  Thank you  for choosing [facility]. We consider it a privelige to serve you.  Delivering excellent health care in a caring and  compassionate way is our goal.  Partnering with you,  so that together we can achieve this goal is our strategy.

## 2016-08-03 DIAGNOSIS — G3184 Mild cognitive impairment, so stated: Secondary | ICD-10-CM | POA: Diagnosis not present

## 2016-08-03 DIAGNOSIS — F5101 Primary insomnia: Secondary | ICD-10-CM | POA: Diagnosis not present

## 2016-08-03 DIAGNOSIS — G43711 Chronic migraine without aura, intractable, with status migrainosus: Secondary | ICD-10-CM | POA: Diagnosis not present

## 2016-08-03 DIAGNOSIS — F900 Attention-deficit hyperactivity disorder, predominantly inattentive type: Secondary | ICD-10-CM | POA: Diagnosis not present

## 2016-08-03 DIAGNOSIS — G894 Chronic pain syndrome: Secondary | ICD-10-CM | POA: Diagnosis not present

## 2016-08-05 ENCOUNTER — Other Ambulatory Visit (HOSPITAL_COMMUNITY): Payer: Self-pay | Admitting: Oncology

## 2016-08-05 ENCOUNTER — Encounter (HOSPITAL_COMMUNITY): Payer: Medicare Other | Attending: Hematology & Oncology

## 2016-08-05 DIAGNOSIS — D509 Iron deficiency anemia, unspecified: Secondary | ICD-10-CM | POA: Diagnosis not present

## 2016-08-05 DIAGNOSIS — R790 Abnormal level of blood mineral: Secondary | ICD-10-CM

## 2016-08-05 LAB — COMPREHENSIVE METABOLIC PANEL
ALK PHOS: 56 U/L (ref 38–126)
ALT: 26 U/L (ref 14–54)
AST: 28 U/L (ref 15–41)
Albumin: 4.4 g/dL (ref 3.5–5.0)
Anion gap: 6 (ref 5–15)
BILIRUBIN TOTAL: 0.5 mg/dL (ref 0.3–1.2)
BUN: 28 mg/dL — AB (ref 6–20)
CALCIUM: 8.8 mg/dL — AB (ref 8.9–10.3)
CHLORIDE: 105 mmol/L (ref 101–111)
CO2: 26 mmol/L (ref 22–32)
CREATININE: 0.85 mg/dL (ref 0.44–1.00)
Glucose, Bld: 116 mg/dL — ABNORMAL HIGH (ref 65–99)
Potassium: 4.1 mmol/L (ref 3.5–5.1)
Sodium: 137 mmol/L (ref 135–145)
Total Protein: 7.2 g/dL (ref 6.5–8.1)

## 2016-08-05 LAB — CBC WITH DIFFERENTIAL/PLATELET
BASOS PCT: 0 %
Basophils Absolute: 0 10*3/uL (ref 0.0–0.1)
EOS PCT: 2 %
Eosinophils Absolute: 0.1 10*3/uL (ref 0.0–0.7)
HEMATOCRIT: 42.2 % (ref 36.0–46.0)
HEMOGLOBIN: 14.3 g/dL (ref 12.0–15.0)
LYMPHS PCT: 37 %
Lymphs Abs: 2.4 10*3/uL (ref 0.7–4.0)
MCH: 30.3 pg (ref 26.0–34.0)
MCHC: 33.9 g/dL (ref 30.0–36.0)
MCV: 89.4 fL (ref 78.0–100.0)
MONO ABS: 0.5 10*3/uL (ref 0.1–1.0)
MONOS PCT: 7 %
NEUTROS PCT: 54 %
Neutro Abs: 3.6 10*3/uL (ref 1.7–7.7)
PLATELETS: 198 10*3/uL (ref 150–400)
RBC: 4.72 MIL/uL (ref 3.87–5.11)
RDW: 13.3 % (ref 11.5–15.5)
WBC: 6.6 10*3/uL (ref 4.0–10.5)

## 2016-08-05 LAB — IRON AND TIBC
IRON: 100 ug/dL (ref 28–170)
Saturation Ratios: 22 % (ref 10.4–31.8)
TIBC: 447 ug/dL (ref 250–450)
UIBC: 347 ug/dL

## 2016-08-05 LAB — FERRITIN: FERRITIN: 91 ng/mL (ref 11–307)

## 2016-08-06 ENCOUNTER — Encounter (HOSPITAL_COMMUNITY): Payer: Self-pay

## 2016-08-11 DIAGNOSIS — T148 Other injury of unspecified body region: Secondary | ICD-10-CM | POA: Diagnosis not present

## 2016-08-13 ENCOUNTER — Encounter (HOSPITAL_BASED_OUTPATIENT_CLINIC_OR_DEPARTMENT_OTHER): Payer: Medicare Other

## 2016-08-13 VITALS — BP 108/61 | HR 88 | Temp 98.6°F | Resp 18

## 2016-08-13 DIAGNOSIS — D509 Iron deficiency anemia, unspecified: Secondary | ICD-10-CM | POA: Diagnosis not present

## 2016-08-13 MED ORDER — SODIUM CHLORIDE 0.9 % IV SOLN
125.0000 mg | Freq: Once | INTRAVENOUS | Status: AC
  Administered 2016-08-13: 125 mg via INTRAVENOUS
  Filled 2016-08-13: qty 10

## 2016-08-13 MED ORDER — SODIUM CHLORIDE 0.9 % IV SOLN
Freq: Once | INTRAVENOUS | Status: AC
  Administered 2016-08-13: 15:00:00 via INTRAVENOUS

## 2016-08-13 NOTE — Progress Notes (Signed)
Patient tolerated infusion well.  No distress noted, VSS.  Patient ambulatory out of clinic.

## 2016-08-13 NOTE — Patient Instructions (Signed)
[facility] at [facility] Discharge Instructions  RECOMMENDATIONS MADE BY THE CONSULTANT AND ANY TEST RESULTS WILL BE SENT TO YOUR REFERRING PHYSICIAN.  IV iron today.    Thank you for choosing [facility] at [facility] to provide your oncology and hematology care.  To afford each patient quality time with our provider, please arrive at least 15 minutes before your scheduled appointment time.   Beginning January 23rd 2017 lab work for the Cancer Center will be done in the  Main lab at [place] on 1st floor. If you have a lab appointment with the Cancer Center please come in thru the  Main Entrance and check in at the main information desk  You need to re-schedule your appointment should you arrive 10 or more minutes late.  We strive to give you quality time with our providers, and arriving late affects you and other patients whose appointments are after yours.  Also, if you no show three or more times for appointments you may be dismissed from the clinic at the providers discretion.     Again, thank you for choosing [facility].  Our hope is that these requests will decrease the amount of time that you wait before being seen by our physicians.       _____________________________________________________________  Should you have questions after your visit to [facility], please contact our office at [phone] between the hours of 8:30 a.m. and 4:30 p.m.  Voicemails left after 4:30 p.m. will not be returned until the following business day.  For prescription refill requests, have your pharmacy contact our office.         Resources For Cancer Patients and their Caregivers ? American Cancer Society: Can assist with transportation, wigs, general needs, runs Look Good Feel Better.        1-888-227-6333 ? Cancer Care: Provides financial assistance, online support groups, medication/co-pay assistance.  1-800-813-HOPE  (4673) ? [facility] Assists [place] cancer patients and their families through emotional , educational and financial support.  336-427-4357 ? [place] DSS Where to apply for food stamps, Medicaid and utility assistance. 336-342-1394 ? [facility]: Transportation to medical appointments. 336-347-2287 ? Social Security Administration: May apply for disability if have a Stage IV cancer. 336-342-7796 1-800-772-1213 ? [place] Aging, Disability and Transit Services: Assists with nutrition, care and transit needs. 336-349-2343  Cancer Center Support Programs: @[email or web address]@ > Cancer Support Group  2nd Tuesday of the month 1pm-2pm, Journey Room  > Creative Journey  3rd Tuesday of the month 1130am-1pm, Journey Room  > Look Good Feel Better  1st Wednesday of the month 10am-12 noon, Journey Room (Call American Cancer Society to register 1-800-395-5775)    

## 2016-08-16 NOTE — Assessment & Plan Note (Signed)
Controlled, no change in medication  

## 2016-08-16 NOTE — Assessment & Plan Note (Signed)
Updated lab needed at/ before next visit.   

## 2016-08-16 NOTE — Assessment & Plan Note (Signed)
Hyperlipidemia:Low fat diet discussed and encouraged.   Lipid Panel  Lab Results  Component Value Date   CHOL 160 01/27/2016   HDL 40 (L) 01/27/2016   LDLCALC 78 01/27/2016   LDLDIRECT 138 (H) 09/15/2012   TRIG 209 (H) 01/27/2016   CHOLHDL 4.0 01/27/2016  uncontrolled Updated lab needed at/ before next visit.

## 2016-08-16 NOTE — Progress Notes (Signed)
   [name]     MRN: [id number]      DOB: [birth year]   HPI [name] is here with a main concern of bilateral hand pain with nodules and reduced function progresiveley worsening in past 6 to 8 months. Requests re evaluation by Specialist , has seen rheumatology in the past, concerned about ongoing debility and ;loss of function ROS Denies recent fever or chills. Denies sinus pressure, nasal congestion, ear pain or sore throat. Denies chest congestion, productive cough or wheezing. Denies chest pains, palpitations and leg swelling Denies abdominal pain, nausea, vomiting,diarrhea or constipation.   Denies dysuria, frequency, hesitancy or incontinence. Denies skin breakdown or rash PE  BP 118/80   Pulse 76   Resp 18   Ht 5\' 4"  (1.626 m)   Wt 230 lb (104.3 kg)   SpO2 98%   BMI 39.48 kg/m   Patient alert and oriented and in no cardiopulmonary distress.  HEENT: No facial asymmetry, EOMI,   oropharynx pink and moist.  Neck supple no JVD, no mass.  Chest: Clear to auscultation bilaterally.  CVS: S1, S2 no murmurs, no S3.Regular rate.  ABD: Soft non tender.   Ext: No edema  MS: adequate  though reduced  ROM lumbar  spine, deformity of digits in both hands with palpable nodules  Skin:no ulcerations or rash noted.  Psych: Good eye contact, normal affect. Memory intact not anxious or depressed appearing.  CNS: CN 2-12 intact, power,  normal throughout.no focal deficits noted.   Assessment & Plan  Bilateral hand pain Bilateral hand pain with nodules on fingers, requires  further evaluation by rheumatology , for definitive diagnosis and management  Iron deficiency anemia Updated lab needed at/ before next visit.   Hypothyroidism Controlled, no change in medication   Hyperlipidemia LDL goal <100 Hyperlipidemia:Low fat diet discussed and encouraged.   Lipid Panel  Lab Results  Component Value Date   CHOL 160 01/27/2016   HDL 40 (L) 01/27/2016   LDLCALC 78  01/27/2016   LDLDIRECT 138 (H) 09/15/2012   TRIG 209 (H) 01/27/2016   CHOLHDL 4.0 01/27/2016  uncontrolled Updated lab needed at/ before next visit.     Morbid obesity Deteriorated. Patient re-educated about  the importance of commitment to a  minimum of 150 minutes of exercise per week.  The importance of healthy food choices with portion control discussed. Encouraged to start a food diary, count calories and to consider  joining a support group. Sample diet sheets offered. Goals set by the patient for the next several months.   Weight /BMI 07/27/2016 06/09/2016 05/13/2016  WEIGHT 230 lb 227 lb 230 lb  HEIGHT 5\' 4"  5\' 4"  -  BMI 39.48 kg/m2 38.95 kg/m2 39.46 kg/m2  Some encounter information is confidential and restricted. Go to Review Flowsheets activity to see all data.

## 2016-08-16 NOTE — Assessment & Plan Note (Signed)
Deteriorated. Patient re-educated about  the importance of commitment to a  minimum of 150 minutes of exercise per week.  The importance of healthy food choices with portion control discussed. Encouraged to start a food diary, count calories and to consider  joining a support group. Sample diet sheets offered. Goals set by the patient for the next several months.   Weight /BMI 07/27/2016 06/09/2016 05/13/2016  WEIGHT 230 lb 227 lb 230 lb  HEIGHT 5\' 4"  5\' 4"  -  BMI 39.48 kg/m2 38.95 kg/m2 39.46 kg/m2  Some encounter information is confidential and restricted. Go to Review Flowsheets activity to see all data.

## 2016-08-19 DIAGNOSIS — M84375A Stress fracture, left foot, initial encounter for fracture: Secondary | ICD-10-CM | POA: Diagnosis not present

## 2016-08-20 ENCOUNTER — Encounter: Payer: Self-pay | Admitting: Family Medicine

## 2016-08-20 DIAGNOSIS — G894 Chronic pain syndrome: Secondary | ICD-10-CM | POA: Diagnosis not present

## 2016-08-20 DIAGNOSIS — R7302 Impaired glucose tolerance (oral): Secondary | ICD-10-CM | POA: Diagnosis not present

## 2016-08-20 DIAGNOSIS — E785 Hyperlipidemia, unspecified: Secondary | ICD-10-CM | POA: Diagnosis not present

## 2016-08-20 DIAGNOSIS — G43711 Chronic migraine without aura, intractable, with status migrainosus: Secondary | ICD-10-CM | POA: Diagnosis not present

## 2016-08-20 DIAGNOSIS — E559 Vitamin D deficiency, unspecified: Secondary | ICD-10-CM | POA: Diagnosis not present

## 2016-08-20 DIAGNOSIS — F900 Attention-deficit hyperactivity disorder, predominantly inattentive type: Secondary | ICD-10-CM | POA: Diagnosis not present

## 2016-08-20 DIAGNOSIS — E039 Hypothyroidism, unspecified: Secondary | ICD-10-CM | POA: Diagnosis not present

## 2016-08-24 ENCOUNTER — Telehealth: Payer: Self-pay | Admitting: Family Medicine

## 2016-08-24 NOTE — Telephone Encounter (Signed)
pls let her know labs of 8/25 reviewed ( I also have them in your box for reference)  Normal thyroid , kidney and liver Vit D low, Blood sugar nearly normal 95.70  TG high 244 Reduce fat and carbs Vit D supplement to be continued weekly

## 2016-08-25 DIAGNOSIS — E7800 Pure hypercholesterolemia, unspecified: Secondary | ICD-10-CM | POA: Diagnosis not present

## 2016-08-25 DIAGNOSIS — Z452 Encounter for adjustment and management of vascular access device: Secondary | ICD-10-CM | POA: Diagnosis not present

## 2016-08-25 DIAGNOSIS — Z86718 Personal history of other venous thrombosis and embolism: Secondary | ICD-10-CM | POA: Diagnosis not present

## 2016-08-25 DIAGNOSIS — Z86711 Personal history of pulmonary embolism: Secondary | ICD-10-CM | POA: Diagnosis not present

## 2016-08-25 DIAGNOSIS — Z95828 Presence of other vascular implants and grafts: Secondary | ICD-10-CM | POA: Diagnosis not present

## 2016-08-28 DIAGNOSIS — Z9289 Personal history of other medical treatment: Secondary | ICD-10-CM

## 2016-08-28 HISTORY — DX: Personal history of other medical treatment: Z92.89

## 2016-08-28 NOTE — Telephone Encounter (Signed)
Result letter sent.

## 2016-09-01 ENCOUNTER — Other Ambulatory Visit: Payer: Self-pay | Admitting: Family Medicine

## 2016-09-01 ENCOUNTER — Telehealth: Payer: Self-pay

## 2016-09-01 DIAGNOSIS — G894 Chronic pain syndrome: Secondary | ICD-10-CM

## 2016-09-01 MED ORDER — HYDROMORPHONE HCL 4 MG PO TABS: ORAL_TABLET | ORAL | 0 refills | Status: DC

## 2016-09-01 NOTE — Telephone Encounter (Signed)
Whoever treated her for acute foot fracture would have provided extra meds I think, But  I see no record of the fracture on her record. GFollow through with her on that, also I will need print out of most recent narc use as well as will need to contact her pain clinic and verify with them before any additional med prescribed. Ps start this long process, no quick fix.

## 2016-09-01 NOTE — Telephone Encounter (Signed)
After review, since meds ae due , I will prescribe exactly the same way to bridge to her appt at Pain clinic. Pls send a note which o have written, a co[py of the script to her Pain Doc, spk with nurse to let them know being sent pls Dr Merlene Laughter has been treating her headaches, he would be her best bet for chronic pain management if she does decide to change , bUT she needs to be accepted with a new Provider befoore she stops current one I have entered rreferral to Dr Merlene Laughter, script and letter are prepared

## 2016-09-01 NOTE — Telephone Encounter (Signed)
Patient aware.  Will come and collect script.  Correspondence faxed to Dr. Jeanell Sparrow at [facility].

## 2016-09-04 DIAGNOSIS — M7989 Other specified soft tissue disorders: Secondary | ICD-10-CM | POA: Diagnosis not present

## 2016-09-04 DIAGNOSIS — R2232 Localized swelling, mass and lump, left upper limb: Secondary | ICD-10-CM | POA: Diagnosis not present

## 2016-09-04 DIAGNOSIS — M797 Fibromyalgia: Secondary | ICD-10-CM | POA: Diagnosis not present

## 2016-09-04 DIAGNOSIS — M2550 Pain in unspecified joint: Secondary | ICD-10-CM | POA: Diagnosis not present

## 2016-09-09 DIAGNOSIS — M5416 Radiculopathy, lumbar region: Secondary | ICD-10-CM | POA: Diagnosis not present

## 2016-09-09 DIAGNOSIS — M4806 Spinal stenosis, lumbar region: Secondary | ICD-10-CM | POA: Diagnosis not present

## 2016-09-16 ENCOUNTER — Encounter (HOSPITAL_COMMUNITY): Payer: Medicare Other

## 2016-09-17 ENCOUNTER — Telehealth: Payer: Self-pay

## 2016-09-17 NOTE — Telephone Encounter (Signed)
Noted.  Patient scheduled for 9/22 @ 11

## 2016-09-17 NOTE — Telephone Encounter (Signed)
C/o of burning with urination x 3 days.

## 2016-09-17 NOTE — Telephone Encounter (Signed)
Needs to be eva;luated in office with mD, no appt available today

## 2016-09-18 ENCOUNTER — Encounter: Payer: Self-pay | Admitting: Family Medicine

## 2016-09-18 ENCOUNTER — Ambulatory Visit (INDEPENDENT_AMBULATORY_CARE_PROVIDER_SITE_OTHER): Payer: Medicare Other | Admitting: Family Medicine

## 2016-09-18 VITALS — BP 130/72 | HR 98 | Resp 18 | Ht 64.0 in | Wt 233.1 lb

## 2016-09-18 DIAGNOSIS — Z23 Encounter for immunization: Secondary | ICD-10-CM | POA: Diagnosis not present

## 2016-09-18 DIAGNOSIS — N3001 Acute cystitis with hematuria: Secondary | ICD-10-CM

## 2016-09-18 LAB — POCT URINALYSIS DIPSTICK
BILIRUBIN UA: NEGATIVE
Glucose, UA: NEGATIVE
KETONES UA: NEGATIVE
Leukocytes, UA: NEGATIVE
NITRITE UA: NEGATIVE
PH UA: 6.5
Protein, UA: NEGATIVE
SPEC GRAV UA: 1.015
Urobilinogen, UA: 0.2

## 2016-09-18 MED ORDER — CIPROFLOXACIN HCL 500 MG PO TABS: 500.0000 mg | ORAL_TABLET | Freq: Two times a day (BID) | ORAL | 0 refills | Status: DC

## 2016-09-18 MED ORDER — PHENAZOPYRIDINE HCL 200 MG PO TABS: 200.0000 mg | ORAL_TABLET | Freq: Three times a day (TID) | ORAL | 0 refills | Status: DC | PRN

## 2016-09-18 NOTE — Patient Instructions (Signed)
Push fluids Take the antibiotic for 10 full days Take the pyridium as needed for pain Call if not better by Monday

## 2016-09-18 NOTE — Progress Notes (Signed)
Subjective:    [name] is a 55 y.o. female who complains of burning with urination, dysuria, frequency, hematuria and pain in the lower abdomen and and across low back. She has had symptoms for 3 days. Patient also complains of mild vaginal irritation, used montisat. Patient denies fever and vomiting. Patient does have a history of recurrent UTI. Patient does not have a history of pyelonephritis. She took clindamycin and flagyl yesterday in desperation due to her symptoms.  The following portions of the patient's history were reviewed and updated as appropriate: allergies, current medications, past medical history, past surgical history and problem list.  Review of Systems Pertinent items are noted in HPI.    Objective:    BP 130/72   Pulse 98   Resp 18   Ht 5\' 4"  (1.626 m)   Wt 233 lb 1.3 oz (105.7 kg)   SpO2 96%   BMI 40.01 kg/m  General appearance: alert, cooperative and no distress Head: Normocephalic, without obvious abnormality, atraumatic Throat: lips, mucosa, and tongue normal; teeth and gums normal Neck: no adenopathy, supple, symmetrical, trachea midline and thyroid not enlarged, symmetric, no tenderness/mass/nodules Back: symmetric, no curvature. ROM normal. No CVA tenderness. Lungs: clear to auscultation bilaterally Heart: regular rate and rhythm, S1, S2 normal, no murmur, click, rub or gallop Abdomen: normal findings: soft, non-tender  Laboratory:  Urine dipstick: negative for all components.  Trace Blood Micro exam: not done.    Assessment:    Acute cystitis  Explained that I cannot do a culture because of her antibiotic use.  Recommend a 10 day course of antibiotics with back pain although has no CVAT.   Plan:   Patient Instructions  Push fluids Take the antibiotic for 10 full days Take the pyridium as needed for pain Call if not better by Monday    Medications: ciprofloxacin.

## 2016-09-23 ENCOUNTER — Encounter (HOSPITAL_COMMUNITY): Payer: Medicare Other

## 2016-09-30 ENCOUNTER — Telehealth: Payer: Self-pay

## 2016-09-30 ENCOUNTER — Other Ambulatory Visit: Payer: Self-pay

## 2016-09-30 DIAGNOSIS — M79641 Pain in right hand: Secondary | ICD-10-CM

## 2016-09-30 DIAGNOSIS — M79642 Pain in left hand: Principal | ICD-10-CM

## 2016-09-30 DIAGNOSIS — R790 Abnormal level of blood mineral: Secondary | ICD-10-CM

## 2016-09-30 DIAGNOSIS — D509 Iron deficiency anemia, unspecified: Secondary | ICD-10-CM

## 2016-09-30 DIAGNOSIS — R229 Localized swelling, mass and lump, unspecified: Secondary | ICD-10-CM

## 2016-09-30 MED ORDER — CYCLOBENZAPRINE HCL 10 MG PO TABS: 10.0000 mg | ORAL_TABLET | Freq: Two times a day (BID) | ORAL | 1 refills | Status: DC | PRN

## 2016-09-30 MED ORDER — FENOFIBRATE 145 MG PO TABS: 145.0000 mg | ORAL_TABLET | Freq: Every day | ORAL | 1 refills | Status: DC

## 2016-09-30 MED ORDER — PRAVASTATIN SODIUM 80 MG PO TABS: 80.0000 mg | ORAL_TABLET | Freq: Every day | ORAL | 1 refills | Status: DC

## 2016-10-01 ENCOUNTER — Encounter (HOSPITAL_COMMUNITY): Payer: Medicare Other | Attending: Hematology & Oncology

## 2016-10-01 ENCOUNTER — Other Ambulatory Visit: Payer: Self-pay | Admitting: Family Medicine

## 2016-10-02 NOTE — Telephone Encounter (Signed)
pls derermine if she has a preference for the second opinion, if not, then Dr Charlestine Night to eval, pls enter ( ensure has not seen him and she is willing to go befoire entering, thanks!)

## 2016-10-05 ENCOUNTER — Telehealth: Payer: Self-pay

## 2016-10-05 DIAGNOSIS — Z1239 Encounter for other screening for malignant neoplasm of breast: Secondary | ICD-10-CM

## 2016-10-05 NOTE — Telephone Encounter (Signed)
Orders entered

## 2016-10-06 ENCOUNTER — Encounter (HOSPITAL_COMMUNITY): Payer: Medicare Other | Attending: Hematology & Oncology

## 2016-10-06 DIAGNOSIS — R790 Abnormal level of blood mineral: Secondary | ICD-10-CM | POA: Insufficient documentation

## 2016-10-06 DIAGNOSIS — D509 Iron deficiency anemia, unspecified: Secondary | ICD-10-CM | POA: Insufficient documentation

## 2016-10-06 LAB — IRON AND TIBC
IRON: 130 ug/dL (ref 28–170)
Saturation Ratios: 35 % — ABNORMAL HIGH (ref 10.4–31.8)
TIBC: 368 ug/dL (ref 250–450)
UIBC: 238 ug/dL

## 2016-10-06 LAB — CBC WITH DIFFERENTIAL/PLATELET
Basophils Absolute: 0 10*3/uL (ref 0.0–0.1)
Basophils Relative: 0 %
Eosinophils Absolute: 0.1 10*3/uL (ref 0.0–0.7)
Eosinophils Relative: 1 %
HEMATOCRIT: 40.7 % (ref 36.0–46.0)
HEMOGLOBIN: 13.8 g/dL (ref 12.0–15.0)
LYMPHS ABS: 2.6 10*3/uL (ref 0.7–4.0)
Lymphocytes Relative: 38 %
MCH: 30.1 pg (ref 26.0–34.0)
MCHC: 33.9 g/dL (ref 30.0–36.0)
MCV: 88.9 fL (ref 78.0–100.0)
MONOS PCT: 6 %
Monocytes Absolute: 0.4 10*3/uL (ref 0.1–1.0)
NEUTROS ABS: 3.7 10*3/uL (ref 1.7–7.7)
NEUTROS PCT: 55 %
Platelets: 181 10*3/uL (ref 150–400)
RBC: 4.58 MIL/uL (ref 3.87–5.11)
RDW: 13.1 % (ref 11.5–15.5)
WBC: 6.8 10*3/uL (ref 4.0–10.5)

## 2016-10-06 LAB — COMPREHENSIVE METABOLIC PANEL
ALK PHOS: 71 U/L (ref 38–126)
ALT: 38 U/L (ref 14–54)
ANION GAP: 9 (ref 5–15)
AST: 38 U/L (ref 15–41)
Albumin: 4.2 g/dL (ref 3.5–5.0)
BILIRUBIN TOTAL: 0.7 mg/dL (ref 0.3–1.2)
BUN: 20 mg/dL (ref 6–20)
CALCIUM: 9.3 mg/dL (ref 8.9–10.3)
CO2: 22 mmol/L (ref 22–32)
Chloride: 106 mmol/L (ref 101–111)
Creatinine, Ser: 0.73 mg/dL (ref 0.44–1.00)
GFR calc non Af Amer: >60 mL/min (ref >60)
Glucose, Bld: 101 mg/dL — ABNORMAL HIGH (ref 65–99)
Potassium: 3.7 mmol/L (ref 3.5–5.1)
Sodium: 137 mmol/L (ref 135–145)
TOTAL PROTEIN: 7 g/dL (ref 6.5–8.1)

## 2016-10-06 LAB — FERRITIN: Ferritin: 150 ng/mL (ref 11–307)

## 2016-10-08 DIAGNOSIS — F5101 Primary insomnia: Secondary | ICD-10-CM | POA: Diagnosis not present

## 2016-10-08 DIAGNOSIS — F900 Attention-deficit hyperactivity disorder, predominantly inattentive type: Secondary | ICD-10-CM | POA: Diagnosis not present

## 2016-10-08 DIAGNOSIS — G894 Chronic pain syndrome: Secondary | ICD-10-CM | POA: Diagnosis not present

## 2016-10-08 DIAGNOSIS — G3184 Mild cognitive impairment, so stated: Secondary | ICD-10-CM | POA: Diagnosis not present

## 2016-10-08 DIAGNOSIS — G43711 Chronic migraine without aura, intractable, with status migrainosus: Secondary | ICD-10-CM | POA: Diagnosis not present

## 2016-10-08 DIAGNOSIS — G43719 Chronic migraine without aura, intractable, without status migrainosus: Secondary | ICD-10-CM | POA: Diagnosis not present

## 2016-10-09 NOTE — Telephone Encounter (Signed)
Patient does not have a preference and would like to see Dr. Charlestine Night

## 2016-10-09 NOTE — Addendum Note (Signed)
Addended by: Denman George B on: 10/09/2016 02:52 PM   Modules accepted: Orders

## 2016-10-13 ENCOUNTER — Other Ambulatory Visit: Payer: Self-pay | Admitting: Family Medicine

## 2016-10-13 DIAGNOSIS — Z1239 Encounter for other screening for malignant neoplasm of breast: Secondary | ICD-10-CM

## 2016-10-15 ENCOUNTER — Other Ambulatory Visit: Payer: Self-pay

## 2016-10-15 MED ORDER — VERAPAMIL HCL 120 MG PO TABS: 120.0000 mg | ORAL_TABLET | Freq: Two times a day (BID) | ORAL | 1 refills | Status: DC

## 2016-10-26 DIAGNOSIS — M79672 Pain in left foot: Secondary | ICD-10-CM | POA: Diagnosis not present

## 2016-10-26 DIAGNOSIS — M545 Low back pain: Secondary | ICD-10-CM | POA: Diagnosis not present

## 2016-10-26 DIAGNOSIS — M79641 Pain in right hand: Secondary | ICD-10-CM | POA: Diagnosis not present

## 2016-10-26 DIAGNOSIS — M2550 Pain in unspecified joint: Secondary | ICD-10-CM | POA: Diagnosis not present

## 2016-10-26 DIAGNOSIS — F5104 Psychophysiologic insomnia: Secondary | ICD-10-CM | POA: Diagnosis not present

## 2016-10-26 DIAGNOSIS — M79671 Pain in right foot: Secondary | ICD-10-CM | POA: Diagnosis not present

## 2016-10-26 DIAGNOSIS — M79642 Pain in left hand: Secondary | ICD-10-CM | POA: Diagnosis not present

## 2016-10-27 ENCOUNTER — Other Ambulatory Visit: Payer: Self-pay

## 2016-10-27 DIAGNOSIS — Z1231 Encounter for screening mammogram for malignant neoplasm of breast: Secondary | ICD-10-CM

## 2016-10-28 ENCOUNTER — Encounter (HOSPITAL_COMMUNITY): Payer: Medicare Other

## 2016-10-29 ENCOUNTER — Other Ambulatory Visit: Payer: Self-pay

## 2016-10-29 ENCOUNTER — Encounter: Payer: Medicare Other | Admitting: Family Medicine

## 2016-10-29 MED ORDER — LEVOTHYROXINE SODIUM 125 MCG PO TABS: 125.0000 ug | ORAL_TABLET | Freq: Every day | ORAL | 1 refills | Status: DC

## 2016-10-30 ENCOUNTER — Other Ambulatory Visit: Payer: Self-pay

## 2016-10-30 ENCOUNTER — Encounter (HOSPITAL_COMMUNITY): Payer: Medicare Other | Attending: Hematology & Oncology

## 2016-10-30 ENCOUNTER — Other Ambulatory Visit (HOSPITAL_COMMUNITY)
Admission: RE | Admit: 2016-10-30 | Discharge: 2016-10-30 | Disposition: A | Discharge status: Home / Self Care | Payer: Medicare Other | Source: Ambulatory Visit | Attending: Rheumatology | Admitting: Rheumatology

## 2016-10-30 DIAGNOSIS — D509 Iron deficiency anemia, unspecified: Secondary | ICD-10-CM

## 2016-10-30 DIAGNOSIS — M545 Low back pain: Secondary | ICD-10-CM | POA: Diagnosis not present

## 2016-10-30 DIAGNOSIS — F5104 Psychophysiologic insomnia: Secondary | ICD-10-CM | POA: Diagnosis not present

## 2016-10-30 DIAGNOSIS — D500 Iron deficiency anemia secondary to blood loss (chronic): Secondary | ICD-10-CM | POA: Insufficient documentation

## 2016-10-30 DIAGNOSIS — M79641 Pain in right hand: Secondary | ICD-10-CM | POA: Insufficient documentation

## 2016-10-30 DIAGNOSIS — M79642 Pain in left hand: Secondary | ICD-10-CM | POA: Insufficient documentation

## 2016-10-30 DIAGNOSIS — M2550 Pain in unspecified joint: Secondary | ICD-10-CM | POA: Insufficient documentation

## 2016-10-30 DIAGNOSIS — M79672 Pain in left foot: Secondary | ICD-10-CM | POA: Insufficient documentation

## 2016-10-30 DIAGNOSIS — R790 Abnormal level of blood mineral: Secondary | ICD-10-CM | POA: Insufficient documentation

## 2016-10-30 LAB — COMPREHENSIVE METABOLIC PANEL
ALBUMIN: 4.1 g/dL (ref 3.5–5.0)
ALK PHOS: 69 U/L (ref 38–126)
ALT: 29 U/L (ref 14–54)
ANION GAP: 7 (ref 5–15)
AST: 34 U/L (ref 15–41)
BILIRUBIN TOTAL: 0.3 mg/dL (ref 0.3–1.2)
BUN: 23 mg/dL — AB (ref 6–20)
CALCIUM: 9.1 mg/dL (ref 8.9–10.3)
CO2: 25 mmol/L (ref 22–32)
Chloride: 105 mmol/L (ref 101–111)
Creatinine, Ser: 0.88 mg/dL (ref 0.44–1.00)
GFR calc Af Amer: >60 mL/min (ref >60)
GFR calc non Af Amer: >60 mL/min (ref >60)
GLUCOSE: 100 mg/dL — AB (ref 65–99)
Potassium: 3.7 mmol/L (ref 3.5–5.1)
SODIUM: 137 mmol/L (ref 135–145)
Total Protein: 6.9 g/dL (ref 6.5–8.1)

## 2016-10-30 LAB — CBC WITH DIFFERENTIAL/PLATELET
BASOS ABS: 0 10*3/uL (ref 0.0–0.1)
BASOS PCT: 0 %
EOS ABS: 0.1 10*3/uL (ref 0.0–0.7)
Eosinophils Relative: 1 %
HEMATOCRIT: 39.9 % (ref 36.0–46.0)
HEMOGLOBIN: 13.7 g/dL (ref 12.0–15.0)
Lymphocytes Relative: 32 %
Lymphs Abs: 2.2 10*3/uL (ref 0.7–4.0)
MCH: 30.9 pg (ref 26.0–34.0)
MCHC: 34.3 g/dL (ref 30.0–36.0)
MCV: 89.9 fL (ref 78.0–100.0)
Monocytes Absolute: 0.5 10*3/uL (ref 0.1–1.0)
Monocytes Relative: 7 %
NEUTROS ABS: 4 10*3/uL (ref 1.7–7.7)
NEUTROS PCT: 60 %
Platelets: 181 10*3/uL (ref 150–400)
RBC: 4.44 MIL/uL (ref 3.87–5.11)
RDW: 13.1 % (ref 11.5–15.5)
WBC: 6.8 10*3/uL (ref 4.0–10.5)

## 2016-10-30 LAB — IRON AND TIBC
Iron: 101 ug/dL (ref 28–170)
Saturation Ratios: 25 % (ref 10.4–31.8)
TIBC: 405 ug/dL (ref 250–450)
UIBC: 304 ug/dL

## 2016-10-30 LAB — SEDIMENTATION RATE: SED RATE: 5 mm/h (ref 0–22)

## 2016-10-30 LAB — FERRITIN: Ferritin: 99 ng/mL (ref 11–307)

## 2016-10-30 MED ORDER — VERAPAMIL HCL 120 MG PO TABS: 120.0000 mg | ORAL_TABLET | Freq: Two times a day (BID) | ORAL | 1 refills | Status: DC

## 2016-11-05 LAB — HIGH SENSITIVITY CRP: CRP HIGH SENSITIVITY: 1.26 mg/L (ref 0.00–3.00)

## 2016-11-09 ENCOUNTER — Telehealth: Payer: Self-pay

## 2016-11-09 DIAGNOSIS — N644 Mastodynia: Secondary | ICD-10-CM

## 2016-11-09 DIAGNOSIS — Z853 Personal history of malignant neoplasm of breast: Secondary | ICD-10-CM

## 2016-11-09 DIAGNOSIS — Z09 Encounter for follow-up examination after completed treatment for conditions other than malignant neoplasm: Secondary | ICD-10-CM

## 2016-11-09 NOTE — Telephone Encounter (Signed)
Orders entered

## 2016-11-10 ENCOUNTER — Telehealth (HOSPITAL_COMMUNITY): Payer: Self-pay | Admitting: *Deleted

## 2016-11-10 NOTE — Telephone Encounter (Signed)
Error

## 2016-11-13 ENCOUNTER — Ambulatory Visit (HOSPITAL_COMMUNITY): Payer: Medicare Other | Admitting: Hematology & Oncology

## 2016-11-23 ENCOUNTER — Other Ambulatory Visit (HOSPITAL_COMMUNITY): Payer: Self-pay | Admitting: Oncology

## 2016-11-23 DIAGNOSIS — D500 Iron deficiency anemia secondary to blood loss (chronic): Secondary | ICD-10-CM

## 2016-11-24 ENCOUNTER — Encounter (HOSPITAL_COMMUNITY): Payer: Medicare Other

## 2016-11-24 DIAGNOSIS — D500 Iron deficiency anemia secondary to blood loss (chronic): Secondary | ICD-10-CM | POA: Diagnosis not present

## 2016-11-24 LAB — CBC WITH DIFFERENTIAL/PLATELET
BASOS ABS: 0 10*3/uL (ref 0.0–0.1)
BASOS PCT: 0 %
EOS ABS: 0.1 10*3/uL (ref 0.0–0.7)
Eosinophils Relative: 1 %
HCT: 42.7 % (ref 36.0–46.0)
HEMOGLOBIN: 14.6 g/dL (ref 12.0–15.0)
Lymphocytes Relative: 39 %
Lymphs Abs: 2.7 10*3/uL (ref 0.7–4.0)
MCH: 30.7 pg (ref 26.0–34.0)
MCHC: 34.2 g/dL (ref 30.0–36.0)
MCV: 89.7 fL (ref 78.0–100.0)
MONOS PCT: 7 %
Monocytes Absolute: 0.5 10*3/uL (ref 0.1–1.0)
NEUTROS PCT: 53 %
Neutro Abs: 3.6 10*3/uL (ref 1.7–7.7)
Platelets: 203 10*3/uL (ref 150–400)
RBC: 4.76 MIL/uL (ref 3.87–5.11)
RDW: 13 % (ref 11.5–15.5)
WBC: 6.9 10*3/uL (ref 4.0–10.5)

## 2016-11-24 LAB — RENAL FUNCTION PANEL
Albumin: 4.3 g/dL (ref 3.5–5.0)
Anion gap: 9 (ref 5–15)
BUN: 20 mg/dL (ref 6–20)
CHLORIDE: 104 mmol/L (ref 101–111)
CO2: 23 mmol/L (ref 22–32)
CREATININE: 0.9 mg/dL (ref 0.44–1.00)
Calcium: 9.7 mg/dL (ref 8.9–10.3)
GFR calc non Af Amer: >60 mL/min (ref >60)
Glucose, Bld: 107 mg/dL — ABNORMAL HIGH (ref 65–99)
Phosphorus: 4 mg/dL (ref 2.5–4.6)
Potassium: 3.9 mmol/L (ref 3.5–5.1)
Sodium: 136 mmol/L (ref 135–145)

## 2016-11-24 LAB — FERRITIN: FERRITIN: 145 ng/mL (ref 11–307)

## 2016-11-24 LAB — IRON AND TIBC
IRON: 114 ug/dL (ref 28–170)
Saturation Ratios: 25 % (ref 10.4–31.8)
TIBC: 451 ug/dL — ABNORMAL HIGH (ref 250–450)
UIBC: 337 ug/dL

## 2016-11-25 ENCOUNTER — Other Ambulatory Visit: Payer: Self-pay | Admitting: Family Medicine

## 2016-12-03 NOTE — Addendum Note (Signed)
Addended by: Denman George B on: 12/03/2016 11:03 AM   Modules accepted: Orders

## 2016-12-09 DIAGNOSIS — M5416 Radiculopathy, lumbar region: Secondary | ICD-10-CM | POA: Diagnosis not present

## 2016-12-09 DIAGNOSIS — M48061 Spinal stenosis, lumbar region without neurogenic claudication: Secondary | ICD-10-CM | POA: Diagnosis not present

## 2016-12-16 ENCOUNTER — Other Ambulatory Visit (HOSPITAL_COMMUNITY)
Admission: RE | Admit: 2016-12-16 | Discharge: 2016-12-16 | Disposition: A | Discharge status: Home / Self Care | Payer: Medicare Other | Source: Ambulatory Visit | Attending: Family Medicine | Admitting: Family Medicine

## 2016-12-16 ENCOUNTER — Encounter: Payer: Self-pay | Admitting: Family Medicine

## 2016-12-16 ENCOUNTER — Ambulatory Visit (INDEPENDENT_AMBULATORY_CARE_PROVIDER_SITE_OTHER): Payer: Medicare Other | Admitting: Family Medicine

## 2016-12-16 VITALS — BP 118/70 | HR 100 | Temp 98.5°F | Resp 18 | Ht 64.0 in | Wt 241.0 lb

## 2016-12-16 DIAGNOSIS — Z0000 Encounter for general adult medical examination without abnormal findings: Secondary | ICD-10-CM | POA: Diagnosis not present

## 2016-12-16 DIAGNOSIS — N760 Acute vaginitis: Secondary | ICD-10-CM | POA: Diagnosis not present

## 2016-12-16 DIAGNOSIS — R0683 Snoring: Secondary | ICD-10-CM

## 2016-12-16 DIAGNOSIS — M549 Dorsalgia, unspecified: Secondary | ICD-10-CM

## 2016-12-16 DIAGNOSIS — E038 Other specified hypothyroidism: Secondary | ICD-10-CM

## 2016-12-16 DIAGNOSIS — Z1211 Encounter for screening for malignant neoplasm of colon: Secondary | ICD-10-CM | POA: Diagnosis not present

## 2016-12-16 DIAGNOSIS — Z124 Encounter for screening for malignant neoplasm of cervix: Secondary | ICD-10-CM

## 2016-12-16 DIAGNOSIS — R7301 Impaired fasting glucose: Secondary | ICD-10-CM

## 2016-12-16 DIAGNOSIS — M5440 Lumbago with sciatica, unspecified side: Secondary | ICD-10-CM | POA: Diagnosis not present

## 2016-12-16 DIAGNOSIS — Z23 Encounter for immunization: Secondary | ICD-10-CM | POA: Diagnosis not present

## 2016-12-16 DIAGNOSIS — Z1151 Encounter for screening for human papillomavirus (HPV): Secondary | ICD-10-CM | POA: Diagnosis not present

## 2016-12-16 DIAGNOSIS — E559 Vitamin D deficiency, unspecified: Secondary | ICD-10-CM

## 2016-12-16 DIAGNOSIS — Z01419 Encounter for gynecological examination (general) (routine) without abnormal findings: Secondary | ICD-10-CM | POA: Diagnosis not present

## 2016-12-16 DIAGNOSIS — E785 Hyperlipidemia, unspecified: Secondary | ICD-10-CM

## 2016-12-16 LAB — POC HEMOCCULT BLD/STL (OFFICE/1-CARD/DIAGNOSTIC): FECAL OCCULT BLD: NEGATIVE

## 2016-12-16 MED ORDER — FLUCONAZOLE 150 MG PO TABS: ORAL_TABLET | ORAL | 0 refills | Status: DC

## 2016-12-16 MED ORDER — KETOROLAC TROMETHAMINE 60 MG/2ML IM SOLN
60.0000 mg | Freq: Once | INTRAMUSCULAR | Status: AC
  Administered 2016-12-16: 60 mg via INTRAMUSCULAR

## 2016-12-16 NOTE — Assessment & Plan Note (Signed)
laceration to left middle finger x 1 day, no superinfection noted Topical antibiotic as needed After obtaining informed consent, the vaccine is  administered by LPN.

## 2016-12-16 NOTE — Patient Instructions (Addendum)
Annual wellness 2nd or 3rd week in January  MD follow up end February  Fasting labs 2nd week in January  TdAP today due to cut on right middle finger  Fluconazole tablets #2 sent for trial to see if helps with vaginal itch,blood work in January will test for any evidence of hSV2 exposure which may present as burning and stingin pain in vulvovaginal area  Toradol 60 mg iM for uncontrolled back pain.  You are referred for sleep study , due to h/o excess snoring and fatigue  Thank you  for choosing [facility]. We consider it a privelige to serve you.  Delivering excellent health care in a caring and  compassionate way is our goal.  Partnering with you,  so that together we can achieve this goal is our strategy.       k

## 2016-12-16 NOTE — Assessment & Plan Note (Signed)

## 2016-12-21 DIAGNOSIS — R0683 Snoring: Secondary | ICD-10-CM | POA: Insufficient documentation

## 2016-12-21 NOTE — Assessment & Plan Note (Signed)
Increased and uncontrolled, toradol administered at visit 

## 2016-12-21 NOTE — Assessment & Plan Note (Signed)
Chronic fatigue and snoring, body habitus compatible with sleep apnea, refer to neurology for eval

## 2016-12-21 NOTE — Assessment & Plan Note (Signed)
Chronic vaginal itch not responding to premarin, specimens for testing and 2 day trial of fluconazole, will also test HPV status

## 2016-12-21 NOTE — Progress Notes (Signed)
    [name]     MRN: [id number]      DOB: 08-30-1961  HPI: Patient is in for annual physical exam. Cut right mid finger yesterday, wound open, no drainage, mildly tender C/o excessive fatigue and snoring, needs re  Evaluation for sleep apnea Co excessive vaginal itch and dryness, not sexually active, has used premarin in excess, no significant improvement C/o increased mid and low back pain radiating down right leg,seeing vascular surgeon and pain specialist about this also c/o excessive burning in feet Immunization is reviewed , and  updated if needed.   PE: Pleasant  female, alert and oriented x 3, in no cardio-pulmonary distress. Afebrile. HEENT No facial trauma or asymetry. Sinuses non tender.  Extra occullar muscles intact, pupils equally reactive to light. External ears normal, tympanic membranes clear. Oropharynx moist, no exudate. Neck: supple, no adenopathy,JVD or thyromegaly.No bruits.  Chest: Clear to ascultation bilaterally.No crackles or wheezes. Non tender to palpation  Breast:  asymetry,right larger than left, implant present. right tenderness. No nipple discharge or inversion. No axillary or supraclavicular adenopathy  Cardiovascular system; Heart sounds normal,  S1 and  S2 ,no S3.  No murmur, or thrill. Apical beat not displaced Peripheral pulses normal.  Abdomen: Soft, non tender, no organomegaly or masses. No bruits. Bowel sounds normal. No guarding, tenderness or rebound.  Rectal:  Normal sphincter tone. No rectal mass. Guaiac negative stool.  GU: External genitalia normal female genitalia , normal female distribution of hair. No lesions. Urethral meatus normal in size, no  Prolapse, no lesions visibly  Present. Bladder non tender. Vagina wall tender and dry , with no visible lesions ,no  discharge present . Adequate pelvic support no  cystocele or rectocele noted  Uterus absent, no adnexal masses, no  adnexal  tenderness.   Musculoskeletal exam: Decreased  ROM of spine, adequate in  hips , shoulders and knees. No deformity ,swelling or crepitus noted. No muscle wasting or atrophy.   Neurologic: Cranial nerves 2 to 12 intact. Power, tone ,sensation and reflexes normal throughout. No disturbance in gait. No tremor.  Skin: Laceration to right mid finger approx 1.5 cm length, no drainage or erythema. Pigmentation normal throughout  Psych; Normal mood and affect. Judgement and concentration normal   Assessment & Plan:  Annual physical exam Annual exam as documented. Counseling done  re healthy lifestyle involving commitment to 150 minutes exercise per week, heart healthy diet, and attaining healthy weight.The importance of adequate sleep also discussed. Regular seat belt use and home safety, is also discussed. Changes in health habits are decided on by the patient with goals and time frames  set for achieving them. Immunization and cancer screening needs are specifically addressed at this visit.   Need for Tdap vaccination laceration to left middle finger x 1 day, no superinfection noted Topical antibiotic as needed After obtaining informed consent, the vaccine is  administered by LPN.   Snoring Chronic fatigue and snoring, body habitus compatible with sleep apnea, refer to neurology for eval  Back pain with radiation Increased and uncontrolled, toradol administered at visit  Vaginitis and vulvovaginitis Chronic vaginal itch not responding to premarin, specimens for testing and 2 day trial of fluconazole, will also test HPV status

## 2016-12-22 ENCOUNTER — Other Ambulatory Visit: Payer: Medicare Other

## 2016-12-22 DIAGNOSIS — Z23 Encounter for immunization: Secondary | ICD-10-CM | POA: Diagnosis not present

## 2016-12-22 LAB — CYTOLOGY - PAP
ADEQUACY: ABSENT
DIAGNOSIS: NEGATIVE
HPV: NOT DETECTED

## 2016-12-24 ENCOUNTER — Encounter (HOSPITAL_COMMUNITY): Payer: Medicare Other

## 2016-12-24 ENCOUNTER — Encounter (HOSPITAL_COMMUNITY): Payer: Medicare Other | Attending: Hematology & Oncology | Admitting: Hematology & Oncology

## 2016-12-24 ENCOUNTER — Encounter (HOSPITAL_COMMUNITY): Payer: Self-pay | Admitting: Hematology & Oncology

## 2016-12-24 VITALS — BP 103/89 | HR 90 | Temp 97.9°F | Resp 18 | Wt 240.8 lb

## 2016-12-24 DIAGNOSIS — D500 Iron deficiency anemia secondary to blood loss (chronic): Secondary | ICD-10-CM

## 2016-12-24 DIAGNOSIS — R790 Abnormal level of blood mineral: Secondary | ICD-10-CM | POA: Insufficient documentation

## 2016-12-24 DIAGNOSIS — G8929 Other chronic pain: Secondary | ICD-10-CM | POA: Diagnosis not present

## 2016-12-24 DIAGNOSIS — R232 Flushing: Secondary | ICD-10-CM | POA: Diagnosis not present

## 2016-12-24 DIAGNOSIS — C50012 Malignant neoplasm of nipple and areola, left female breast: Secondary | ICD-10-CM | POA: Diagnosis not present

## 2016-12-24 DIAGNOSIS — Z95828 Presence of other vascular implants and grafts: Secondary | ICD-10-CM

## 2016-12-24 DIAGNOSIS — D509 Iron deficiency anemia, unspecified: Secondary | ICD-10-CM | POA: Diagnosis not present

## 2016-12-24 NOTE — Progress Notes (Signed)
Barbara Nakayama, MD [address] [place] [address]  No diagnosis found.  CURRENT THERAPY: Observation  INTERVAL HISTORY: [name] 55 y.o. female returns for followup of iron deficiency anemia. AND history of Paget's disease of Barbara left breast status post resection with breast reconstruction with a left sided TRAM flap. This was done at [facility] with her initial diagnosis of Paget's disease in 2006. AND  Medical noncompliance with multiple missed appointments and lab appointments since June 2015: 08/06/2014, 08/07/2014, 08/14/2014, 08/17/2014. However, [name] has frequent hospital admissions at [facility]  She has a history of blood clots, with PE and has an IVC filter in place, not on anticoagulation.  [name] returns to [name] today unaccompanied.  She was scheduled for a mammogram in November but did not attend. She had to attend a doctor's appointment with her husband that day instead. She rescheduled her mammogram for next week.   She reports a deep itching in her left chest reconstruction. This is concerning to her. She reports Barbara feeling that her right breast implant is leaking, this is a constant concern. She only feels this while laying down. She reports intermittent pinching / burning feeling while sitting up. This has become so uncomfortable it has started bothering her. Her implant on Barbara right is saline. [name] believes she needs Barbara implant removed.  She has a painful abdominal hernia. This pain worsens with standing. She has been trying to lose weight. She would like to lose 100 lbs total.  She reports neuropathy in her feet. She believes this is coming from her "abdominal hernia." She notes that she has no other reason to have neuropathy.  She reports back pain associated with her vertebrae and her IVC filter. This pain wraps around her rib cage. She was [facility] on December 13th, by Dr. Jeanell Sparrow,  referred by Dr. Moshe Cipro.   [name] states she needs a hormonal panel check. She recently went to a concert where everyone was wearing coats because it was cold, however she was burning up. She reports she is miserable with these hot flashes. Her body feels like she is sun burnt from Barbara inside. She does not have a gynecologist and has not seen one in a long time. She had a hysterectomy but does not recall who her surgeon was. She is curious if there are cancer patients who can take hormones. She has been looking at natural supplements as well like black cohosh. She switched from soy milk to almond milk.  Her sleep is affected by her pain and hot flashes. She is constantly up and down at night. She is taking Cymbalta.  [name] states she had an annual physical exam with Dr. Moshe Cipro on 12/16/16 where a pap smear and rectal check were performed. She presents for ongoing follow-up of iron deficiency anemia.   Past Medical History:  Diagnosis Date  . Acute renal failure ([place])    renal faliure with surgery   . Anemia   . Anxiety   . Bilateral ovarian cysts   . Blood transfusion without reported diagnosis   . Cancer ([place])    left breast   . Carpal tunnel syndrome   . Clotting disorder ([place])   . Depression   . DVT (deep venous thrombosis) ([place])   . DVT (deep venous thrombosis) ([place])   . E coli infection   . Fibromyalgia   . GERD (gastroesophageal reflux  disease)   . H/O bilateral salpingo-oophorectomy 02/28/2015  . Hx of migraines   . Hypercholesterolemia   . Interstitial cystitis   . Irregular heartbeat   . Medically noncompliant 02/28/2015  . PE (pulmonary embolism)   . Perforation bowel ([place])   . Pneumonia   . Sepsis(995.91)   . Stroke ([place])   . SVT (supraventricular tachycardia) ([place])   . Thyroid disease     has Paget's disease of female breast ([place]); Hypothyroidism; Hyperlipidemia LDL goal <100; Iron deficiency anemia; Anxiety state; CARPAL TUNNEL SYNDROME, RIGHT; OVARIAN CYST;  Backache; FIBROMYALGIA; INTERSTITIAL CYSTITIS; Metabolic syndrome X; Postsurgical menopause; Insomnia; Fatigue; Annual physical exam; Headache disorder; Morbid obesity ([place]); GERD (gastroesophageal reflux disease); Seasonal allergies; Vaginitis and vulvovaginitis; Irregular heart rate; Low ferritin level; Nausea alone; Back pain with radiation; Vitamin D deficiency; Headache syndrome; S/P insertion of IVC (inferior vena caval) filter; Bilateral hand pain; Need for Tdap vaccination; and Snoring on her problem list.     is allergic to nitrofurantoin; bisacodyl; clarithromycin; clarithromycin; clindamycin hcl; codeine; monosodium glutamate; and scopolamine hbr.  [name] had no medications administered during this visit.  Past Surgical History:  Procedure Laterality Date  . ABDOMINAL HYSTERECTOMY    . APPENDECTOMY    . bowel fistula    . BOWEL RESECTION    . BREAST ENHANCEMENT SURGERY  1983  . BREAST SURGERY    . CARPAL TUNNEL RELEASE    . CERVICAL SPINE SURGERY    . CESAREAN SECTION  1986  . COLON SURGERY    . COSMETIC SURGERY    . ENTEROCUTANEOUS FISTULA CLOSURE N/A 08/09/2013   [facility], Dr Dossie Der  . FRACTURE SURGERY     arm right (as a child)   . heart ablation    . HERNIA REPAIR    . MASTECTOMY    . removal of breast implants  1993  . VENA CAVA FILTER PLACEMENT      Review of Systems  Constitutional: Positive for weight loss (intentional).       Hot flashes  HENT: Negative.   Eyes: Negative.   Respiratory: Negative.   Cardiovascular: Negative.   Gastrointestinal: Positive for abdominal pain (associated with abdominal hernia).  Genitourinary: Negative.   Musculoskeletal: Positive for back pain.  Skin: Positive for itching.       Right breast implant feel as though it is leaking when lying down. When sitting up, there is intermittent burning / pinching in Barbara right breast implant. Deep itching in Barbara left breast flap.  Neurological: Negative.   Endo/Heme/Allergies:  Negative.   Psychiatric/Behavioral: [name] has insomnia.        Sleep trouble associated with pain and hot flashes  All other systems reviewed and are negative. 14 point review of systems was performed and is negative except as detailed under history of present illness and above   PHYSICAL EXAMINATION  ECOG PERFORMANCE STATUS: 1 - Symptomatic but completely ambulatory  Vitals:   12/24/16 1036  BP: 103/89  Pulse: 90  Resp: 18  Temp: 97.9 F (36.6 C)    Physical Exam  Constitutional: She is oriented to person, place, and time and well-developed, well-nourished, and in no distress.  Obese  HENT:  Head: Normocephalic and atraumatic.  Mouth/Throat: Oropharynx is clear and moist.  Eyes: Conjunctivae and EOM are normal. Pupils are equal, round, and reactive to light.  Neck: Normal range of motion. Neck supple.  Cardiovascular: Normal rate, regular rhythm and normal heart sounds.   Pulmonary/Chest: Effort normal and breath sounds  normal.  Abdominal: Soft. Bowel sounds are normal.  Musculoskeletal: Normal range of motion.  Neurological: She is alert and oriented to person, place, and time. Gait normal.  Skin: Skin is warm and dry.  Nursing note and vitals reviewed.   LABORATORY DATA: I have reviewed Barbara laboratory studies listed below: Results for KARIANNE, NOGUEIRA (MRN [id number]) as of 12/24/2016 10:45  Ref. Range 11/24/2016 13:14 11/24/2016 13:15  Sodium Latest Ref Range: 135 - 145 mmol/L  136  Potassium Latest Ref Range: 3.5 - 5.1 mmol/L  3.9  Chloride Latest Ref Range: 101 - 111 mmol/L  104  CO2 Latest Ref Range: 22 - 32 mmol/L  23  BUN Latest Ref Range: 6 - 20 mg/dL  20  Creatinine Latest Ref Range: 0.44 - 1.00 mg/dL  0.90  Calcium Latest Ref Range: 8.9 - 10.3 mg/dL  9.7  EGFR (Non-African Amer.) Latest Ref Range: >60 mL/min  >60  EGFR (African American) Latest Ref Range: >60 mL/min  >60  Glucose Latest Ref Range: 65 - 99 mg/dL  107 (H)  Anion gap Latest Ref Range: 5 -  15   9  Phosphorus Latest Ref Range: 2.5 - 4.6 mg/dL  4.0  Albumin Latest Ref Range: 3.5 - 5.0 g/dL  4.3  Iron Latest Ref Range: 28 - 170 ug/dL 114   UIBC Latest Units: ug/dL 337   TIBC Latest Ref Range: 250 - 450 ug/dL 451 (H)   Saturation Ratios Latest Ref Range: 10.4 - 31.8 % 25   Ferritin Latest Ref Range: 11 - 307 ng/mL 145   WBC Latest Ref Range: 4.0 - 10.5 K/uL 6.9   RBC Latest Ref Range: 3.87 - 5.11 MIL/uL 4.76   Hemoglobin Latest Ref Range: 12.0 - 15.0 g/dL 14.6   HCT Latest Ref Range: 36.0 - 46.0 % 42.7   MCV Latest Ref Range: 78.0 - 100.0 fL 89.7   MCH Latest Ref Range: 26.0 - 34.0 pg 30.7   MCHC Latest Ref Range: 30.0 - 36.0 g/dL 34.2   RDW Latest Ref Range: 11.5 - 15.5 % 13.0   Platelets Latest Ref Range: 150 - 400 K/uL 203   Neutrophils Latest Units: % 53   Lymphocytes Latest Units: % 39   Monocytes Relative Latest Units: % 7   Eosinophil Latest Units: % 1   Basophil Latest Units: % 0   NEUT# Latest Ref Range: 1.7 - 7.7 K/uL 3.6   Lymphocyte # Latest Ref Range: 0.7 - 4.0 K/uL 2.7   Monocyte # Latest Ref Range: 0.1 - 1.0 K/uL 0.5   Eosinophils Absolute Latest Ref Range: 0.0 - 0.7 K/uL 0.1   Basophils Absolute Latest Ref Range: 0.0 - 0.1 K/uL 0.0     ASSESSMENT AND PLAN:  Iron deficiency from iron malabsorption *Paget's disease of Barbara left breast [name] receives mammograms in [place] History of DVT, PE and IVC filter placement not on anticoagulation Followed at [facility] for an ECF/ventral hernia Hot flashes Chronic pain  Recent labs and iron studies were reviewed with [name]. Results are noted above. No need for iron replacement at this time. Will continue to follow labs every 6 to 8 weeks. Iron deficiency is secondary to malabsorption from multiple intestinal surgeries versus chronic GI blood loss.  She has a complicated GI history and this is followed at [facility].  She has a history of Paget's disease of Barbara left breast and obtains mammograms in  [place]. Prior treatment was at [facility].   We will arrange for ongoing  follow-up of her iron deficiency in 6 months with repeat laboratory studies and an office visit. I advised her if she has symptoms of iron deficiency prior to follow-up to notify us and we can check her iron levels and CBC at any time.  She does not follow with a gynecologist. She follows with Dr. Moshe Cipro for primary care needs.  A hormone panel will be checked today. She complains of severe hot flashes. She currently takes Cymbalta. I advised her that HRT would be contraindicated for her.   She is scheduled for diagnostic mammogram on 01/01/2017.  Chronic pain is now managed at [facility] at Barbara pain management clinic there.   She will return for follow up in 6 months with labs every 6 weeks.  Orders Placed This Encounter  Procedures  . Follicle Stimulating Hormone    Standing Status:   Future    Number of Occurrences:   1    Standing Expiration Date:   12/25/2016  . Luteinizing hormone    Standing Status:   Future    Number of Occurrences:   1    Standing Expiration Date:   12/25/2016  . Estradiol    Standing Status:   Future    Number of Occurrences:   1    Standing Expiration Date:   12/25/2016    All questions were answered. [name] knows to call Barbara clinic with any problems, questions or concerns. We can certainly see [name] much sooner if necessary.  This note was electronically signed.  This document serves as a record of services personally performed by Ancil Linsey, MD. It was created on her behalf by Arlyce Harman, a trained medical scribe. Barbara creation of this record is based on Barbara scribe's personal observations and Barbara provider's statements to them. This document has been checked and approved by Barbara attending provider.  I have reviewed Barbara above documentation for accuracy and completeness, and I agree with Barbara above.  Kelby Fam. Whitney Muse, MD

## 2016-12-24 NOTE — Patient Instructions (Addendum)
**Note Barbara-Identified via Obfuscation** [name] at [facility] Discharge Instructions  RECOMMENDATIONS MADE BY THE CONSULTANT AND ANY TEST RESULTS WILL BE SENT TO YOUR REFERRING PHYSICIAN.  You were seen today by Dr. Whitney Muse Follow up in 6 months, be sure to have labs drawn before visit. You will have labs drawn today.    Thank you for choosing [place] at [facility] to provide your oncology and hematology care.  To afford each patient quality time with our provider, please arrive at least 15 minutes before your scheduled appointment time.    If you have a lab appointment with the [facility] please come in thru the  Main Entrance and check in at the main information desk  You need to re-schedule your appointment should you arrive 10 or more minutes late.  We strive to give you quality time with our providers, and arriving late affects you and other patients whose appointments are after yours.  Also, if you no show three or more times for appointments you may be dismissed from the clinic at the providers discretion.     Again, thank you for choosing [facility].  Our hope is that these requests will decrease the amount of time that you wait before being seen by our physicians.       _____________________________________________________________  Should you have questions after your visit to [facility], please contact our office at [phone] between the hours of 8:30 a.m. and 4:30 p.m.  Voicemails left after 4:30 p.m. will not be returned until the following business day.  For prescription refill requests, have your pharmacy contact our office.       Resources For Cancer Patients and their Caregivers ? American Cancer Society: Can assist with transportation, wigs, general needs, runs Look Good Feel Better.        661-150-4353 ? Cancer Care: Provides financial assistance, online support groups, medication/co-pay assistance.  1-800-813-HOPE  (540) 412-4755) ? [facility] Assists [place] cancer patients and their families through emotional , educational and financial support.  (351) 223-3139 ? [place] DSS Where to apply for food stamps, Medicaid and utility assistance. 430-536-3324 ? RCATS: Transportation to medical appointments. 239 875 7485 ? Social Security Administration: May apply for disability if have a Stage IV cancer. 671-372-4621 938-850-9695 ? [employer], Disability and Transit Services: Assists with nutrition, care and transit needs. [place] Support Programs: @10RELATIVEDAYS @ > Cancer Support Group  2nd Tuesday of the month 1pm-2pm, Journey Room  > Creative Journey  3rd Tuesday of the month 1130am-1pm, Journey Room  > Look Good Feel Better  1st Wednesday of the month 10am-12 noon, Journey Room (Call [place] to register [phone])

## 2016-12-25 ENCOUNTER — Encounter (HOSPITAL_COMMUNITY): Payer: Self-pay | Admitting: Hematology & Oncology

## 2016-12-25 LAB — FOLLICLE STIMULATING HORMONE: FSH: 58.7 m[IU]/mL

## 2016-12-25 LAB — ESTRADIOL: Estradiol: <5 pg/mL

## 2016-12-25 LAB — LUTEINIZING HORMONE: LH: 35.9 m[IU]/mL

## 2016-12-30 ENCOUNTER — Other Ambulatory Visit: Payer: Medicare Other

## 2017-01-01 ENCOUNTER — Other Ambulatory Visit: Payer: Medicare Other

## 2017-01-05 DIAGNOSIS — F900 Attention-deficit hyperactivity disorder, predominantly inattentive type: Secondary | ICD-10-CM | POA: Diagnosis not present

## 2017-01-05 DIAGNOSIS — G3184 Mild cognitive impairment, so stated: Secondary | ICD-10-CM | POA: Diagnosis not present

## 2017-01-05 DIAGNOSIS — G43711 Chronic migraine without aura, intractable, with status migrainosus: Secondary | ICD-10-CM | POA: Diagnosis not present

## 2017-01-05 DIAGNOSIS — G43719 Chronic migraine without aura, intractable, without status migrainosus: Secondary | ICD-10-CM | POA: Diagnosis not present

## 2017-01-05 DIAGNOSIS — G4733 Obstructive sleep apnea (adult) (pediatric): Secondary | ICD-10-CM | POA: Diagnosis not present

## 2017-01-05 DIAGNOSIS — G894 Chronic pain syndrome: Secondary | ICD-10-CM | POA: Diagnosis not present

## 2017-01-05 DIAGNOSIS — F5101 Primary insomnia: Secondary | ICD-10-CM | POA: Diagnosis not present

## 2017-01-07 ENCOUNTER — Other Ambulatory Visit: Payer: Medicare Other

## 2017-01-12 ENCOUNTER — Other Ambulatory Visit: Payer: Self-pay

## 2017-01-12 ENCOUNTER — Ambulatory Visit: Payer: Medicare Other

## 2017-01-12 MED ORDER — NEXIUM 40 MG PO CPDR: 40.0000 mg | DELAYED_RELEASE_CAPSULE | Freq: Two times a day (BID) | ORAL | 1 refills | Status: DC

## 2017-01-14 ENCOUNTER — Other Ambulatory Visit: Payer: Medicare Other

## 2017-01-20 ENCOUNTER — Ambulatory Visit
Admission: RE | Admit: 2017-01-20 | Discharge: 2017-01-20 | Disposition: A | Discharge status: Home / Self Care | Payer: Medicare Other | Source: Ambulatory Visit | Attending: Family Medicine | Admitting: Family Medicine

## 2017-01-20 ENCOUNTER — Other Ambulatory Visit: Payer: Self-pay | Admitting: Family Medicine

## 2017-01-20 DIAGNOSIS — Z853 Personal history of malignant neoplasm of breast: Secondary | ICD-10-CM

## 2017-01-20 DIAGNOSIS — N6310 Unspecified lump in the right breast, unspecified quadrant: Secondary | ICD-10-CM

## 2017-01-20 DIAGNOSIS — N644 Mastodynia: Secondary | ICD-10-CM

## 2017-01-20 DIAGNOSIS — N6311 Unspecified lump in the right breast, upper outer quadrant: Secondary | ICD-10-CM | POA: Diagnosis not present

## 2017-01-21 ENCOUNTER — Other Ambulatory Visit: Payer: Self-pay | Admitting: Family Medicine

## 2017-01-21 DIAGNOSIS — N6310 Unspecified lump in the right breast, unspecified quadrant: Secondary | ICD-10-CM

## 2017-01-21 DIAGNOSIS — G43711 Chronic migraine without aura, intractable, with status migrainosus: Secondary | ICD-10-CM | POA: Diagnosis not present

## 2017-01-21 DIAGNOSIS — F900 Attention-deficit hyperactivity disorder, predominantly inattentive type: Secondary | ICD-10-CM | POA: Diagnosis not present

## 2017-01-21 DIAGNOSIS — F5101 Primary insomnia: Secondary | ICD-10-CM | POA: Diagnosis not present

## 2017-01-21 DIAGNOSIS — G894 Chronic pain syndrome: Secondary | ICD-10-CM | POA: Diagnosis not present

## 2017-01-21 DIAGNOSIS — G4733 Obstructive sleep apnea (adult) (pediatric): Secondary | ICD-10-CM | POA: Diagnosis not present

## 2017-01-21 DIAGNOSIS — G3184 Mild cognitive impairment, so stated: Secondary | ICD-10-CM | POA: Diagnosis not present

## 2017-01-22 ENCOUNTER — Ambulatory Visit
Admission: RE | Admit: 2017-01-22 | Discharge: 2017-01-22 | Disposition: A | Discharge status: Home / Self Care | Payer: Medicare Other | Source: Ambulatory Visit | Attending: Family Medicine | Admitting: Family Medicine

## 2017-01-22 DIAGNOSIS — I888 Other nonspecific lymphadenitis: Secondary | ICD-10-CM | POA: Diagnosis not present

## 2017-01-22 DIAGNOSIS — N6310 Unspecified lump in the right breast, unspecified quadrant: Secondary | ICD-10-CM

## 2017-02-10 ENCOUNTER — Encounter (HOSPITAL_COMMUNITY): Payer: Medicare Other | Attending: Oncology

## 2017-02-10 ENCOUNTER — Other Ambulatory Visit (HOSPITAL_COMMUNITY): Payer: Self-pay | Admitting: Oncology

## 2017-02-10 ENCOUNTER — Other Ambulatory Visit (HOSPITAL_COMMUNITY)
Admission: RE | Admit: 2017-02-10 | Discharge: 2017-02-10 | Disposition: A | Discharge status: Home / Self Care | Payer: Medicare Other | Source: Ambulatory Visit | Attending: Family Medicine | Admitting: Family Medicine

## 2017-02-10 DIAGNOSIS — Z0000 Encounter for general adult medical examination without abnormal findings: Secondary | ICD-10-CM | POA: Diagnosis not present

## 2017-02-10 DIAGNOSIS — E785 Hyperlipidemia, unspecified: Secondary | ICD-10-CM | POA: Diagnosis not present

## 2017-02-10 DIAGNOSIS — D509 Iron deficiency anemia, unspecified: Secondary | ICD-10-CM | POA: Diagnosis not present

## 2017-02-10 DIAGNOSIS — R7301 Impaired fasting glucose: Secondary | ICD-10-CM | POA: Diagnosis not present

## 2017-02-10 DIAGNOSIS — E559 Vitamin D deficiency, unspecified: Secondary | ICD-10-CM | POA: Diagnosis not present

## 2017-02-10 DIAGNOSIS — R790 Abnormal level of blood mineral: Secondary | ICD-10-CM | POA: Diagnosis not present

## 2017-02-10 DIAGNOSIS — N760 Acute vaginitis: Secondary | ICD-10-CM | POA: Diagnosis not present

## 2017-02-10 LAB — CBC WITH DIFFERENTIAL/PLATELET
BASOS ABS: 0 10*3/uL (ref 0.0–0.1)
BASOS PCT: 0 %
EOS ABS: 0.1 10*3/uL (ref 0.0–0.7)
EOS PCT: 1 %
HCT: 42.2 % (ref 36.0–46.0)
HEMOGLOBIN: 14.5 g/dL (ref 12.0–15.0)
Lymphocytes Relative: 45 %
Lymphs Abs: 2.7 10*3/uL (ref 0.7–4.0)
MCH: 30 pg (ref 26.0–34.0)
MCHC: 34.4 g/dL (ref 30.0–36.0)
MCV: 87.2 fL (ref 78.0–100.0)
Monocytes Absolute: 0.4 10*3/uL (ref 0.1–1.0)
Monocytes Relative: 7 %
NEUTROS PCT: 47 %
Neutro Abs: 2.8 10*3/uL (ref 1.7–7.7)
PLATELETS: 209 10*3/uL (ref 150–400)
RBC: 4.84 MIL/uL (ref 3.87–5.11)
RDW: 14 % (ref 11.5–15.5)
WBC: 6 10*3/uL (ref 4.0–10.5)

## 2017-02-10 LAB — LIPID PANEL
CHOLESTEROL: 135 mg/dL (ref 0–200)
HDL: 39 mg/dL — AB (ref >40)
LDL Cholesterol: 59 mg/dL (ref 0–99)
Total CHOL/HDL Ratio: 3.5 RATIO
Triglycerides: 184 mg/dL — ABNORMAL HIGH (ref <150)
VLDL: 37 mg/dL (ref 0–40)

## 2017-02-10 LAB — COMPREHENSIVE METABOLIC PANEL
ALBUMIN: 4.3 g/dL (ref 3.5–5.0)
ALBUMIN: 4.3 g/dL (ref 3.5–5.0)
ALK PHOS: 51 U/L (ref 38–126)
ALT: 35 U/L (ref 14–54)
ALT: 38 U/L (ref 14–54)
AST: 31 U/L (ref 15–41)
AST: 32 U/L (ref 15–41)
Alkaline Phosphatase: 51 U/L (ref 38–126)
Anion gap: 9 (ref 5–15)
Anion gap: 9 (ref 5–15)
BUN: 29 mg/dL — ABNORMAL HIGH (ref 6–20)
BUN: 29 mg/dL — ABNORMAL HIGH (ref 6–20)
CALCIUM: 9.5 mg/dL (ref 8.9–10.3)
CHLORIDE: 108 mmol/L (ref 101–111)
CO2: 21 mmol/L — AB (ref 22–32)
CO2: 22 mmol/L (ref 22–32)
CREATININE: 0.95 mg/dL (ref 0.44–1.00)
CREATININE: 0.96 mg/dL (ref 0.44–1.00)
Calcium: 9.6 mg/dL (ref 8.9–10.3)
Chloride: 108 mmol/L (ref 101–111)
GFR calc Af Amer: >60 mL/min (ref >60)
GFR calc non Af Amer: >60 mL/min (ref >60)
GFR calc non Af Amer: >60 mL/min (ref >60)
GLUCOSE: 120 mg/dL — AB (ref 65–99)
GLUCOSE: 120 mg/dL — AB (ref 65–99)
Potassium: 3.6 mmol/L (ref 3.5–5.1)
Potassium: 3.6 mmol/L (ref 3.5–5.1)
SODIUM: 138 mmol/L (ref 135–145)
Sodium: 139 mmol/L (ref 135–145)
Total Bilirubin: 0.5 mg/dL (ref 0.3–1.2)
Total Bilirubin: 0.6 mg/dL (ref 0.3–1.2)
Total Protein: 7 g/dL (ref 6.5–8.1)
Total Protein: 7 g/dL (ref 6.5–8.1)

## 2017-02-10 LAB — IRON AND TIBC
IRON: 95 ug/dL (ref 28–170)
Saturation Ratios: 22 % (ref 10.4–31.8)
TIBC: 434 ug/dL (ref 250–450)
UIBC: 339 ug/dL

## 2017-02-10 LAB — FERRITIN: Ferritin: 91 ng/mL (ref 11–307)

## 2017-02-10 LAB — TSH: TSH: 1.023 u[IU]/mL (ref 0.350–4.500)

## 2017-02-11 LAB — HSV 2 ANTIBODY, IGG

## 2017-02-11 LAB — HEMOGLOBIN A1C
HEMOGLOBIN A1C: 5.5 % (ref 4.8–5.6)
Mean Plasma Glucose: 111 mg/dL

## 2017-02-11 LAB — VITAMIN D 25 HYDROXY (VIT D DEFICIENCY, FRACTURES): VIT D 25 HYDROXY: 39.4 ng/mL (ref 30.0–100.0)

## 2017-02-15 ENCOUNTER — Encounter (HOSPITAL_BASED_OUTPATIENT_CLINIC_OR_DEPARTMENT_OTHER): Payer: Medicare Other

## 2017-02-15 ENCOUNTER — Encounter (HOSPITAL_COMMUNITY): Payer: Self-pay

## 2017-02-15 VITALS — BP 118/67 | HR 77 | Temp 97.7°F | Resp 18

## 2017-02-15 DIAGNOSIS — D500 Iron deficiency anemia secondary to blood loss (chronic): Secondary | ICD-10-CM | POA: Diagnosis not present

## 2017-02-15 MED ORDER — SODIUM CHLORIDE 0.9 % IV SOLN
510.0000 mg | Freq: Once | INTRAVENOUS | Status: AC
  Administered 2017-02-15: 510 mg via INTRAVENOUS
  Filled 2017-02-15: qty 17

## 2017-02-15 MED ORDER — SODIUM CHLORIDE 0.9 % IV SOLN
Freq: Once | INTRAVENOUS | Status: AC
  Administered 2017-02-15: 14:00:00 via INTRAVENOUS

## 2017-02-15 NOTE — Progress Notes (Signed)
Feraheme given today . Patient request that is be given over an hour. Patient tolerated it well without issues. Vitals stable and discharged home ambulatory without problems.follow up as scheduled.

## 2017-02-15 NOTE — Patient Instructions (Signed)
[facility] at [facility] Discharge Instructions  RECOMMENDATIONS MADE BY THE CONSULTANT AND ANY TEST RESULTS WILL BE SENT TO YOUR REFERRING PHYSICIAN.  feraheme given today Follow up as scheduled  Thank you for choosing [facility] at [facility] to provide your oncology and hematology care.  To afford each patient quality time with our provider, please arrive at least 15 minutes before your scheduled appointment time.    If you have a lab appointment with the Cancer Center please come in thru the  Main Entrance and check in at the main information desk  You need to re-schedule your appointment should you arrive 10 or more minutes late.  We strive to give you quality time with our providers, and arriving late affects you and other patients whose appointments are after yours.  Also, if you no show three or more times for appointments you may be dismissed from the clinic at the providers discretion.     Again, thank you for choosing [facility].  Our hope is that these requests will decrease the amount of time that you wait before being seen by our physicians.       _____________________________________________________________  Should you have questions after your visit to [facility], please contact our office at [phone] between the hours of 8:30 a.m. and 4:30 p.m.  Voicemails left after 4:30 p.m. will not be returned until the following business day.  For prescription refill requests, have your pharmacy contact our office.       Resources For Cancer Patients and their Caregivers ? American Cancer Society: Can assist with transportation, wigs, general needs, runs Look Good Feel Better.        1-888-227-6333 ? Cancer Care: Provides financial assistance, online support groups, medication/co-pay assistance.  1-800-813-HOPE (4673) ? [facility] Assists [place] cancer patients and  their families through emotional , educational and financial support.  336-427-4357 ? [place] DSS Where to apply for food stamps, Medicaid and utility assistance. 336-342-1394 ? RCATS: Transportation to medical appointments. 336-347-2287 ? Social Security Administration: May apply for disability if have a Stage IV cancer. 336-342-7796 1-800-772-1213 ? [place] Aging, Disability and Transit Services: Assists with nutrition, care and transit needs. 336-349-2343  Cancer Center Support Programs: @10RELATIVEDAYS@ > Cancer Support Group  2nd Tuesday of the month 1pm-2pm, Journey Room  > Creative Journey  3rd Tuesday of the month 1130am-1pm, Journey Room  > Look Good Feel Better  1st Wednesday of the month 10am-12 noon, Journey Room (Call American Cancer Society to register 1-800-395-5775)   

## 2017-02-16 DIAGNOSIS — Z79891 Long term (current) use of opiate analgesic: Secondary | ICD-10-CM | POA: Diagnosis not present

## 2017-02-16 DIAGNOSIS — F900 Attention-deficit hyperactivity disorder, predominantly inattentive type: Secondary | ICD-10-CM | POA: Diagnosis not present

## 2017-02-16 DIAGNOSIS — G43711 Chronic migraine without aura, intractable, with status migrainosus: Secondary | ICD-10-CM | POA: Diagnosis not present

## 2017-02-16 DIAGNOSIS — G8928 Other chronic postprocedural pain: Secondary | ICD-10-CM | POA: Diagnosis not present

## 2017-02-18 ENCOUNTER — Other Ambulatory Visit: Payer: Self-pay | Admitting: Family Medicine

## 2017-02-18 DIAGNOSIS — R790 Abnormal level of blood mineral: Secondary | ICD-10-CM

## 2017-02-18 DIAGNOSIS — D509 Iron deficiency anemia, unspecified: Secondary | ICD-10-CM

## 2017-02-22 ENCOUNTER — Ambulatory Visit: Payer: Medicare Other | Admitting: Family Medicine

## 2017-02-23 ENCOUNTER — Telehealth: Payer: Self-pay

## 2017-02-25 NOTE — Telephone Encounter (Signed)
Please type a note stating that she has a h/o atrial fibrillation and that she has a  [place] filter, I will sign./ pls stamp with my sig Then notify pt of response and send to Dentist,

## 2017-02-25 NOTE — Telephone Encounter (Signed)
Patient aware.   Will fax to dentist at given fax #

## 2017-02-26 ENCOUNTER — Other Ambulatory Visit: Payer: Self-pay

## 2017-02-26 MED ORDER — CYCLOBENZAPRINE HCL 10 MG PO TABS: ORAL_TABLET | ORAL | 0 refills | Status: DC

## 2017-03-04 ENCOUNTER — Telehealth: Payer: Self-pay | Admitting: Family Medicine

## 2017-03-04 NOTE — Telephone Encounter (Signed)
[name] is calling requesting a refill on her ALPRAZolam [name]) 1 MG tablet please advise?

## 2017-03-05 ENCOUNTER — Other Ambulatory Visit: Payer: Self-pay

## 2017-03-05 MED ORDER — ALPRAZOLAM 1 MG PO TABS: 1.0000 mg | ORAL_TABLET | Freq: Two times a day (BID) | ORAL | 1 refills | Status: DC

## 2017-03-05 NOTE — Telephone Encounter (Signed)
Called in.

## 2017-03-09 ENCOUNTER — Other Ambulatory Visit: Payer: Self-pay

## 2017-03-09 MED ORDER — ALPRAZOLAM 1 MG PO TABS: 1.0000 mg | ORAL_TABLET | Freq: Two times a day (BID) | ORAL | 1 refills | Status: DC

## 2017-03-15 ENCOUNTER — Ambulatory Visit: Payer: Medicare Other | Admitting: Family Medicine

## 2017-03-24 ENCOUNTER — Encounter (HOSPITAL_COMMUNITY): Payer: Medicare Other

## 2017-03-29 ENCOUNTER — Encounter (HOSPITAL_COMMUNITY): Payer: Medicare Other | Attending: Oncology

## 2017-03-29 DIAGNOSIS — R790 Abnormal level of blood mineral: Secondary | ICD-10-CM | POA: Insufficient documentation

## 2017-03-29 DIAGNOSIS — D509 Iron deficiency anemia, unspecified: Secondary | ICD-10-CM | POA: Diagnosis present

## 2017-03-29 LAB — COMPREHENSIVE METABOLIC PANEL
ALBUMIN: 4.4 g/dL (ref 3.5–5.0)
ALT: 38 U/L (ref 14–54)
AST: 28 U/L (ref 15–41)
Alkaline Phosphatase: 65 U/L (ref 38–126)
Anion gap: 11 (ref 5–15)
BUN: 20 mg/dL (ref 6–20)
CHLORIDE: 108 mmol/L (ref 101–111)
CO2: 21 mmol/L — AB (ref 22–32)
CREATININE: 1.17 mg/dL — AB (ref 0.44–1.00)
Calcium: 9.8 mg/dL (ref 8.9–10.3)
GFR calc Af Amer: 60 mL/min — ABNORMAL LOW (ref >60)
GFR, EST NON AFRICAN AMERICAN: 51 mL/min — AB (ref >60)
GLUCOSE: 134 mg/dL — AB (ref 65–99)
POTASSIUM: 3.4 mmol/L — AB (ref 3.5–5.1)
SODIUM: 140 mmol/L (ref 135–145)
Total Bilirubin: 0.7 mg/dL (ref 0.3–1.2)
Total Protein: 7.3 g/dL (ref 6.5–8.1)

## 2017-03-29 LAB — CBC WITH DIFFERENTIAL/PLATELET
BASOS PCT: 0 %
Basophils Absolute: 0 10*3/uL (ref 0.0–0.1)
EOS PCT: 1 %
Eosinophils Absolute: 0.1 10*3/uL (ref 0.0–0.7)
HCT: 42.4 % (ref 36.0–46.0)
Hemoglobin: 14.6 g/dL (ref 12.0–15.0)
LYMPHS PCT: 38 %
Lymphs Abs: 2.4 10*3/uL (ref 0.7–4.0)
MCH: 31 pg (ref 26.0–34.0)
MCHC: 34.4 g/dL (ref 30.0–36.0)
MCV: 90 fL (ref 78.0–100.0)
MONO ABS: 0.3 10*3/uL (ref 0.1–1.0)
Monocytes Relative: 5 %
Neutro Abs: 3.5 10*3/uL (ref 1.7–7.7)
Neutrophils Relative %: 56 %
PLATELETS: 211 10*3/uL (ref 150–400)
RBC: 4.71 MIL/uL (ref 3.87–5.11)
RDW: 14.3 % (ref 11.5–15.5)
WBC: 6.3 10*3/uL (ref 4.0–10.5)

## 2017-03-29 LAB — IRON AND TIBC
Iron: 117 ug/dL (ref 28–170)
SATURATION RATIOS: 28 % (ref 10.4–31.8)
TIBC: 416 ug/dL (ref 250–450)
UIBC: 299 ug/dL

## 2017-03-29 LAB — FERRITIN: Ferritin: 202 ng/mL (ref 11–307)

## 2017-03-31 DIAGNOSIS — M5416 Radiculopathy, lumbar region: Secondary | ICD-10-CM | POA: Diagnosis not present

## 2017-03-31 DIAGNOSIS — M48061 Spinal stenosis, lumbar region without neurogenic claudication: Secondary | ICD-10-CM | POA: Diagnosis not present

## 2017-04-05 ENCOUNTER — Encounter: Payer: Self-pay | Admitting: Family Medicine

## 2017-04-05 ENCOUNTER — Ambulatory Visit (INDEPENDENT_AMBULATORY_CARE_PROVIDER_SITE_OTHER): Payer: Medicare Other | Admitting: Family Medicine

## 2017-04-05 VITALS — BP 100/70 | HR 88 | Temp 97.7°F | Resp 16 | Ht 64.0 in | Wt 230.0 lb

## 2017-04-05 DIAGNOSIS — R300 Dysuria: Secondary | ICD-10-CM

## 2017-04-05 DIAGNOSIS — E785 Hyperlipidemia, unspecified: Secondary | ICD-10-CM | POA: Diagnosis not present

## 2017-04-05 DIAGNOSIS — F5104 Psychophysiologic insomnia: Secondary | ICD-10-CM

## 2017-04-05 DIAGNOSIS — E8940 Asymptomatic postprocedural ovarian failure: Secondary | ICD-10-CM

## 2017-04-05 DIAGNOSIS — E559 Vitamin D deficiency, unspecified: Secondary | ICD-10-CM

## 2017-04-05 DIAGNOSIS — E038 Other specified hypothyroidism: Secondary | ICD-10-CM | POA: Diagnosis not present

## 2017-04-05 DIAGNOSIS — R519 Headache, unspecified: Secondary | ICD-10-CM

## 2017-04-05 DIAGNOSIS — R51 Headache: Secondary | ICD-10-CM | POA: Diagnosis not present

## 2017-04-05 DIAGNOSIS — F411 Generalized anxiety disorder: Secondary | ICD-10-CM

## 2017-04-05 LAB — POCT URINALYSIS DIPSTICK
BILIRUBIN UA: NEGATIVE
Glucose, UA: NEGATIVE
KETONES UA: NEGATIVE
Leukocytes, UA: NEGATIVE
Nitrite, UA: NEGATIVE
PH UA: 6.5 (ref 5.0–8.0)
PROTEIN UA: NEGATIVE
RBC UA: NEGATIVE
Spec Grav, UA: 1.025 (ref 1.030–1.035)
Urobilinogen, UA: 1 (ref ?–2.0)

## 2017-04-05 MED ORDER — FLUTICASONE PROPIONATE 50 MCG/ACT NA SUSP: 2.0000 | Freq: Every day | NASAL | 1 refills | Status: DC

## 2017-04-05 NOTE — Patient Instructions (Addendum)
f/u end  August/ early September, call if you need me sooner  CONGRATS on weight loss, continue healthy diet   Fasting lipid, cmp and EGFR, , and TSH, Vitamin D, 1 week before visit  It is important that you exercise regularly at least 30 minutes 5 times a week. If you develop chest pain, have severe difficulty breathing, or feel very tired, stop exercising immediately and seek medical attention   Please work on good  health habits so that your health will improve. 1. Commitment to daily physical activity for 30 to 60  minutes, if you are able to do this.  2. Commitment to wise food choices. Aim for half of your  food intake to be vegetable and fruit, one quarter starchy foods, and one quarter protein. Try to eat on a regular schedule  3 meals per day, snacking between meals should be limited to vegetables or fruits or small portions of nuts. 64 ounces of water per day is generally recommended, unless you have specific health conditions, like heart failure or kidney failure where you will need to limit fluid intake.  3. Commitment to sufficient and a  good quality of physical and mental rest daily, generally between 6 to 8 hours per day.  WITH PERSISTANCE AND PERSEVERANCE, THE IMPOSSIBLE , BECOMES THE NORM!  Hypokalemia Hypokalemia means that the amount of potassium in the blood is lower than normal.Potassium is a chemical that helps regulate the amount of fluid in the body (electrolyte). It also stimulates muscle tightening (contraction) and helps nerves work properly.Normally, most of the body's potassium is inside of cells, and only a very small amount is in the blood. Because the amount in the blood is so small, minor changes to potassium levels in the blood can be life-threatening. What are the causes? This condition may be caused by:  Antibiotic medicine.  Diarrhea or vomiting. Taking too much of a medicine that helps you have a bowel movement (laxative) can cause diarrhea and lead  to hypokalemia.  Chronic kidney disease (CKD).  Medicines that help the body get rid of excess fluid (diuretics).  Eating disorders, such as bulimia.  Low magnesium levels in the body.  Sweating a lot. What are the signs or symptoms? Symptoms of this condition include:  Weakness.  Constipation.  Fatigue.  Muscle cramps.  Mental confusion.  Skipped heartbeats or irregular heartbeat (palpitations).  Tingling or numbness. How is this diagnosed? This condition is diagnosed with a blood test. How is this treated? Hypokalemia can be treated by taking potassium supplements by mouth or adjusting the medicines that you take. Treatment may also include eating more foods that contain a lot of potassium. If your potassium level is very low, you may need to get potassium through an IV tube in one of your veins and be monitored in the hospital. Follow these instructions at home:  Take over-the-counter and prescription medicines only as told by your health care provider. This includes vitamins and supplements.  Eat a healthy diet. A healthy diet includes fresh fruits and vegetables, whole grains, healthy fats, and lean proteins.  If instructed, eat more foods that contain a lot of potassium, such as:  Nuts, such as peanuts and pistachios.  Seeds, such as sunflower seeds and pumpkin seeds.  Peas, lentils, and lima beans.  Whole grain and bran cereals and breads.  Fresh fruits and vegetables, such as apricots, avocado, bananas, cantaloupe, kiwi, oranges, tomatoes, asparagus, and potatoes.  Orange juice.  Tomato juice.  Red meats.  Yogurt.  Keep all follow-up visits as told by your health care provider. This is important. Contact a health care provider if:  You have weakness that gets worse.  You feel your heart pounding or racing.  You vomit.  You have diarrhea.  You have diabetes (diabetes mellitus) and you have trouble keeping your blood sugar (glucose) in your  target range. Get help right away if:  You have chest pain.  You have shortness of breath.  You have vomiting or diarrhea that lasts for more than 2 days.  You faint. This information is not intended to replace advice given to you by your health care provider. Make sure you discuss any questions you have with your health care provider. Document Released: 12/14/2005 Document Revised: 08/01/2016 Document Reviewed: 08/01/2016 Elsevier Interactive Patient Education  2017 [employer].

## 2017-04-07 ENCOUNTER — Telehealth: Payer: Self-pay

## 2017-04-07 ENCOUNTER — Other Ambulatory Visit: Payer: Self-pay

## 2017-04-07 DIAGNOSIS — R790 Abnormal level of blood mineral: Secondary | ICD-10-CM

## 2017-04-07 DIAGNOSIS — D509 Iron deficiency anemia, unspecified: Secondary | ICD-10-CM

## 2017-04-07 MED ORDER — ALPRAZOLAM 1 MG PO TABS: 1.0000 mg | ORAL_TABLET | Freq: Two times a day (BID) | ORAL | 3 refills | Status: DC

## 2017-04-07 MED ORDER — LEVOTHYROXINE SODIUM 125 MCG PO TABS: ORAL_TABLET | ORAL | 1 refills | Status: DC

## 2017-04-07 MED ORDER — NEXIUM 40 MG PO CPDR: 40.0000 mg | DELAYED_RELEASE_CAPSULE | Freq: Two times a day (BID) | ORAL | 1 refills | Status: DC

## 2017-04-07 MED ORDER — FENOFIBRATE 145 MG PO TABS: 145.0000 mg | ORAL_TABLET | Freq: Every day | ORAL | 1 refills | Status: DC

## 2017-04-07 MED ORDER — VITAMIN D (ERGOCALCIFEROL) 1.25 MG (50000 UNIT) PO CAPS: 50000.0000 [IU] | ORAL_CAPSULE | ORAL | 1 refills | Status: DC

## 2017-04-07 MED ORDER — PRAVASTATIN SODIUM 80 MG PO TABS: 80.0000 mg | ORAL_TABLET | Freq: Every day | ORAL | 1 refills | Status: DC

## 2017-04-07 NOTE — Telephone Encounter (Signed)
Requesting refill of premarin. Not on her current medlist. Do you want to refill?

## 2017-04-08 ENCOUNTER — Other Ambulatory Visit: Payer: Self-pay

## 2017-04-08 MED ORDER — ESTROGENS, CONJUGATED 0.625 MG/GM VA CREA: 1.0000 | TOPICAL_CREAM | Freq: Every day | VAGINAL | 1 refills | Status: DC

## 2017-04-08 NOTE — Telephone Encounter (Signed)
OK to refill x 2, pls do

## 2017-04-08 NOTE — Telephone Encounter (Signed)
refilled 

## 2017-04-10 ENCOUNTER — Encounter: Payer: Self-pay | Admitting: Family Medicine

## 2017-04-10 NOTE — Assessment & Plan Note (Signed)
Improved and controlled on current medication 

## 2017-04-10 NOTE — Assessment & Plan Note (Signed)
Sleep hygiene reviewed and written information offered also. Prescription sent for  medication needed.  

## 2017-04-10 NOTE — Progress Notes (Signed)
[name]     MRN: [id number]      DOB: June 09, 1961   HPI [name] is here for follow up and re-evaluation of chronic medical conditions, medication management and review of any available recent lab and radiology data.  Preventive health is updated, specifically  Cancer screening and Immunization.   Questions or concerns regarding consultations or procedures which the PT has had in the interim are  addressed. Still followed at [facility] for chronic pain Contemplating surgery for removal of IVC tip now embedded in her bowels Currently on new eating plan with successful weight loss The PT denies any adverse reactions to current medications since the last visit.  There are no new concerns.  There are no specific complaints   ROS Denies recent fever or chills. Denies sinus pressure, nasal congestion, ear pain or sore throat. Denies chest congestion, productive cough or wheezing. Denies chest pains, palpitations and leg swelling Denies abdominal pain, nausea, vomiting,diarrhea or constipation.   c/o frequency,  Denies hesitancy or incontinence. Denies joint pain, swelling and limitation in mobility. Denies headaches, seizures, numbness, or tingling. Denies depression, anxiety or insomnia. Denies skin break down or rash.   PE  BP 100/70 (BP Location: Right Arm, Patient Position: Sitting, Cuff Size: Large)   Pulse 88   Temp 97.7 F (36.5 C) (Temporal)   Resp 16   Ht 5\' 4"  (1.626 m)   Wt 230 lb (104.3 kg)   SpO2 97%   BMI 39.48 kg/m   Patient alert and oriented and in no cardiopulmonary distress.  HEENT: No facial asymmetry, EOMI,   oropharynx pink and moist.  Neck supple no JVD, no mass.  Chest: Clear to auscultation bilaterally.  CVS: S1, S2 no murmurs, no S3.Regular rate.  ABD: Soft non tender.   Ext: No edema  MS: Adequate ROM spine, shoulders, hips and knees.  Skin: Intact, no ulcerations or rash noted.  Psych: Good eye contact, normal affect. Memory intact not  anxious or depressed appearing.  CNS: CN 2-12 intact, power,  normal throughout.no focal deficits noted.   Assessment & Plan  Hyperlipidemia LDL goal <100 Hyperlipidemia:Low fat diet discussed and encouraged.   Lipid Panel  Lab Results  Component Value Date   CHOL 135 02/10/2017   HDL 39 (L) 02/10/2017   LDLCALC 59 02/10/2017   LDLDIRECT 138 (H) 09/15/2012   TRIG 184 (H) 02/10/2017   CHOLHDL 3.5 02/10/2017  not at goal, needs to reduce fried and fatty foods and cheese     Hypothyroidism Controlled, no change in medication   Insomnia Sleep hygiene reviewed and written information offered also. Prescription sent for  medication needed.   Morbid obesity Improved Patient re-educated about  the importance of commitment to a  minimum of 150 minutes of exercise per week.  The importance of healthy food choices with portion control discussed. Encouraged to start a food diary, count calories and to consider  joining a support group. Sample diet sheets offered. Goals set by the patient for the next several months.   Weight /BMI 04/05/2017 12/24/2016 12/16/2016  WEIGHT 230 lb 240 lb 12.8 oz 241 lb 0.6 oz  HEIGHT 5\' 4"  - 5\' 4"   BMI 39.48 kg/m2 41.33 kg/m2 41.37 kg/m2  Some encounter information is confidential and restricted. Go to Review Flowsheets activity to see all data.      Anxiety state Improved and controlled on current medication  Vitamin D deficiency Continue weekly supplement  Postsurgical menopause Sparing use of topical estrogen 2  to 3 times weekly for dryness  Headache disorder Receives Botox locally with success, uses fioricet sparingly still

## 2017-04-10 NOTE — Assessment & Plan Note (Signed)
Improved Patient re-educated about  the importance of commitment to a  minimum of 150 minutes of exercise per week.  The importance of healthy food choices with portion control discussed. Encouraged to start a food diary, count calories and to consider  joining a support group. Sample diet sheets offered. Goals set by the patient for the next several months.   Weight /BMI 04/05/2017 12/24/2016 12/16/2016  WEIGHT 230 lb 240 lb 12.8 oz 241 lb 0.6 oz  HEIGHT 5\' 4"  - 5\' 4"   BMI 39.48 kg/m2 41.33 kg/m2 41.37 kg/m2  Some encounter information is confidential and restricted. Go to Review Flowsheets activity to see all data.

## 2017-04-10 NOTE — Assessment & Plan Note (Signed)
Continue weekly supplement 

## 2017-04-10 NOTE — Assessment & Plan Note (Signed)
Hyperlipidemia:Low fat diet discussed and encouraged.   Lipid Panel  Lab Results  Component Value Date   CHOL 135 02/10/2017   HDL 39 (L) 02/10/2017   LDLCALC 59 02/10/2017   LDLDIRECT 138 (H) 09/15/2012   TRIG 184 (H) 02/10/2017   CHOLHDL 3.5 02/10/2017  not at goal, needs to reduce fried and fatty foods and cheese

## 2017-04-10 NOTE — Assessment & Plan Note (Signed)
Controlled, no change in medication  

## 2017-04-10 NOTE — Assessment & Plan Note (Signed)
Sparing use of topical estrogen 2 to 3 times weekly for dryness

## 2017-04-10 NOTE — Assessment & Plan Note (Signed)
Receives Botox locally with success, uses fioricet sparingly still

## 2017-05-05 ENCOUNTER — Encounter (HOSPITAL_COMMUNITY): Payer: Medicare Other

## 2017-05-13 ENCOUNTER — Encounter (HOSPITAL_COMMUNITY): Payer: Medicare Other

## 2017-05-17 ENCOUNTER — Encounter (HOSPITAL_COMMUNITY): Payer: Medicare Other | Attending: Oncology

## 2017-05-17 DIAGNOSIS — R790 Abnormal level of blood mineral: Secondary | ICD-10-CM | POA: Insufficient documentation

## 2017-05-17 DIAGNOSIS — D509 Iron deficiency anemia, unspecified: Secondary | ICD-10-CM

## 2017-05-17 LAB — CBC WITH DIFFERENTIAL/PLATELET
BASOS ABS: 0 10*3/uL (ref 0.0–0.1)
Basophils Relative: 0 %
EOS PCT: 2 %
Eosinophils Absolute: 0.1 10*3/uL (ref 0.0–0.7)
HEMATOCRIT: 39.9 % (ref 36.0–46.0)
Hemoglobin: 13.9 g/dL (ref 12.0–15.0)
LYMPHS PCT: 42 %
Lymphs Abs: 2.8 10*3/uL (ref 0.7–4.0)
MCH: 31.4 pg (ref 26.0–34.0)
MCHC: 34.8 g/dL (ref 30.0–36.0)
MCV: 90.1 fL (ref 78.0–100.0)
MONOS PCT: 6 %
Monocytes Absolute: 0.4 10*3/uL (ref 0.1–1.0)
Neutro Abs: 3.3 10*3/uL (ref 1.7–7.7)
Neutrophils Relative %: 50 %
PLATELETS: 179 10*3/uL (ref 150–400)
RBC: 4.43 MIL/uL (ref 3.87–5.11)
RDW: 13.8 % (ref 11.5–15.5)
WBC: 6.6 10*3/uL (ref 4.0–10.5)

## 2017-05-17 LAB — COMPREHENSIVE METABOLIC PANEL
ALT: 31 U/L (ref 14–54)
ANION GAP: 8 (ref 5–15)
AST: 31 U/L (ref 15–41)
Albumin: 4.2 g/dL (ref 3.5–5.0)
Alkaline Phosphatase: 61 U/L (ref 38–126)
BILIRUBIN TOTAL: 0.7 mg/dL (ref 0.3–1.2)
BUN: 28 mg/dL — ABNORMAL HIGH (ref 6–20)
CHLORIDE: 111 mmol/L (ref 101–111)
CO2: 21 mmol/L — ABNORMAL LOW (ref 22–32)
Calcium: 9.4 mg/dL (ref 8.9–10.3)
Creatinine, Ser: 1.08 mg/dL — ABNORMAL HIGH (ref 0.44–1.00)
GFR, EST NON AFRICAN AMERICAN: 57 mL/min — AB (ref >60)
Glucose, Bld: 136 mg/dL — ABNORMAL HIGH (ref 65–99)
POTASSIUM: 3.8 mmol/L (ref 3.5–5.1)
Sodium: 140 mmol/L (ref 135–145)
TOTAL PROTEIN: 7 g/dL (ref 6.5–8.1)

## 2017-05-17 LAB — IRON AND TIBC
IRON: 119 ug/dL (ref 28–170)
SATURATION RATIOS: 31 % (ref 10.4–31.8)
TIBC: 384 ug/dL (ref 250–450)
UIBC: 265 ug/dL

## 2017-05-17 LAB — FERRITIN: FERRITIN: 227 ng/mL (ref 11–307)

## 2017-05-24 ENCOUNTER — Other Ambulatory Visit: Payer: Self-pay | Admitting: Family Medicine

## 2017-05-24 IMAGING — MG 2D DIGITAL DIAGNOSTIC UNILATERAL RIGHT MAMMOGRAM WITH CAD AND AD
1 series · 1 of 5 positions shown · non-contrast
Comparison: Previous exam(s).

CLINICAL DATA: Right breast pain. Patient has a history of treated
left breast cancer, post mastectomy and bilateral implant
reconstruction. She also had reduction mammoplasty of the right
breast.

EXAM:
2D DIGITAL DIAGNOSTIC RIGHT MAMMOGRAM WITH CAD AND ADJUNCT TOMO
ULTRASOUND RIGHT BREAST

[R CCID Breast Tomosynthesis Image tomo · tomo slice 29/56.0]
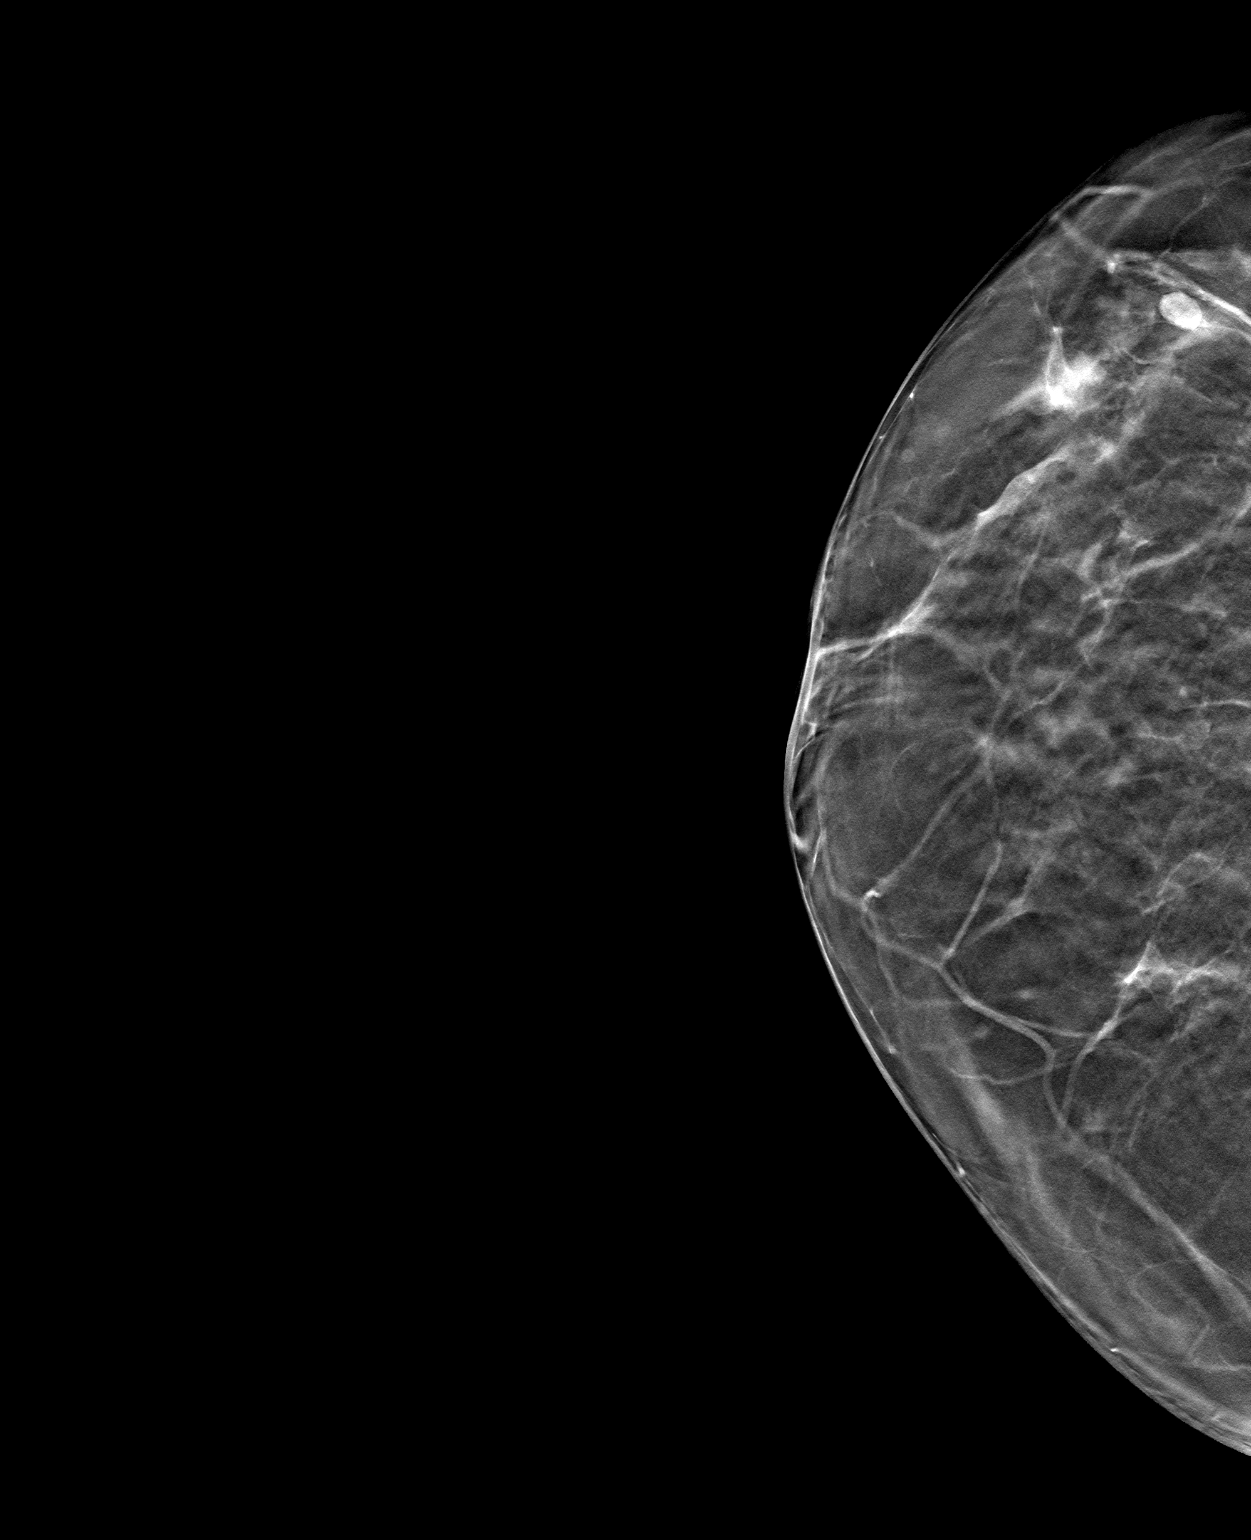

[1 of 5 positions shown; findings below may reference images not displayed]

ACR Breast Density Category b: There are scattered areas of
fibroglandular density.
FINDINGS: Mammographically, there are no suspicious masses, areas of
nonsurgical architectural distortion or microcalcifications in the
right breast. Stable non mass appearing focal asymmetry is seen in
the right breast upper outer quadrant. There is an intact right
subpectoral saline implant.

Mammographic images were processed with CAD.

On physical exam, no suspicious masses are palpated.

Targeted ultrasound is performed, showing right breast 9 o'clock 6
cm from the nipple 0.7 x 0.5 by 0.5 cm mixed echogenicity mass,the
echogenic portion of which demonstrates low level echogenicity in a
snowstorm appearance, thought to be typical for silicone granuloma.
A second silicone granuloma is seen in the right breast 9 o'clock 10
cm from the nipple measuring 1.0 x 1.2 cm. In the right breast 930
o'clock 12 cm from the nipple, there is a benign-appearing lymph
node with indistinct snowstorm appearance of its hilum, likely
representing a benign lymph node containing free silicon. Breast
implant is noted.
IMPRESSION: Findings in the right breast suggestive of prior silicone
extravasation and silicone granuloma formation. The patient admits
to having silicone breast implants in the remote past, which were
explanted after intracapsular rupture. However, given patient's
personal history of breast cancer and heterogeneous appearance of
the mass in the right breast 9 o'clock 6 cm from the nipple,
ultrasound-guided core needle biopsy is recommended for this lesion.

RECOMMENDATION:
Ultrasound-guided core needle biopsy of the right breast.

I have discussed the findings and recommendations with the patient.
Results were also provided in writing at the conclusion of the
visit. If applicable, a reminder letter will be sent to the patient
regarding the next appointment.

BI-RADS CATEGORY  4: Suspicious.

## 2017-06-02 DIAGNOSIS — M5416 Radiculopathy, lumbar region: Secondary | ICD-10-CM | POA: Diagnosis not present

## 2017-06-02 DIAGNOSIS — Z95828 Presence of other vascular implants and grafts: Secondary | ICD-10-CM | POA: Diagnosis not present

## 2017-06-02 DIAGNOSIS — K439 Ventral hernia without obstruction or gangrene: Secondary | ICD-10-CM | POA: Diagnosis not present

## 2017-06-02 DIAGNOSIS — M48061 Spinal stenosis, lumbar region without neurogenic claudication: Secondary | ICD-10-CM | POA: Diagnosis not present

## 2017-06-16 ENCOUNTER — Encounter (HOSPITAL_COMMUNITY): Payer: Medicare Other | Attending: Oncology

## 2017-06-16 ENCOUNTER — Encounter (HOSPITAL_COMMUNITY): Payer: Medicare Other

## 2017-06-16 ENCOUNTER — Ambulatory Visit (HOSPITAL_COMMUNITY): Payer: Medicare Other

## 2017-06-16 DIAGNOSIS — R790 Abnormal level of blood mineral: Secondary | ICD-10-CM | POA: Insufficient documentation

## 2017-06-16 DIAGNOSIS — D509 Iron deficiency anemia, unspecified: Secondary | ICD-10-CM | POA: Insufficient documentation

## 2017-08-04 ENCOUNTER — Other Ambulatory Visit (HOSPITAL_COMMUNITY)
Admission: RE | Admit: 2017-08-04 | Discharge: 2017-08-04 | Disposition: A | Discharge status: Home / Self Care | Payer: Medicare Other | Source: Ambulatory Visit | Attending: Family Medicine | Admitting: Family Medicine

## 2017-08-04 ENCOUNTER — Encounter (HOSPITAL_COMMUNITY): Payer: Medicare Other | Attending: Oncology

## 2017-08-04 DIAGNOSIS — E038 Other specified hypothyroidism: Secondary | ICD-10-CM | POA: Diagnosis present

## 2017-08-04 DIAGNOSIS — R790 Abnormal level of blood mineral: Secondary | ICD-10-CM | POA: Diagnosis present

## 2017-08-04 DIAGNOSIS — D509 Iron deficiency anemia, unspecified: Secondary | ICD-10-CM | POA: Diagnosis not present

## 2017-08-04 DIAGNOSIS — E785 Hyperlipidemia, unspecified: Secondary | ICD-10-CM | POA: Insufficient documentation

## 2017-08-04 LAB — CBC WITH DIFFERENTIAL/PLATELET
BASOS ABS: 0 10*3/uL (ref 0.0–0.1)
BASOS PCT: 0 %
Eosinophils Absolute: 0.1 10*3/uL (ref 0.0–0.7)
Eosinophils Relative: 1 %
HCT: 40.1 % (ref 36.0–46.0)
HEMOGLOBIN: 13.8 g/dL (ref 12.0–15.0)
Lymphocytes Relative: 41 %
Lymphs Abs: 2.7 10*3/uL (ref 0.7–4.0)
MCH: 31.2 pg (ref 26.0–34.0)
MCHC: 34.4 g/dL (ref 30.0–36.0)
MCV: 90.7 fL (ref 78.0–100.0)
MONOS PCT: 5 %
Monocytes Absolute: 0.4 10*3/uL (ref 0.1–1.0)
NEUTROS PCT: 53 %
Neutro Abs: 3.4 10*3/uL (ref 1.7–7.7)
Platelets: 189 10*3/uL (ref 150–400)
RBC: 4.42 MIL/uL (ref 3.87–5.11)
RDW: 13.4 % (ref 11.5–15.5)
WBC: 6.6 10*3/uL (ref 4.0–10.5)

## 2017-08-04 LAB — LIPID PANEL
CHOLESTEROL: 175 mg/dL (ref 0–200)
HDL: 48 mg/dL (ref >40)
LDL CALC: 95 mg/dL (ref 0–99)
Total CHOL/HDL Ratio: 3.6 RATIO
Triglycerides: 159 mg/dL — ABNORMAL HIGH (ref <150)
VLDL: 32 mg/dL (ref 0–40)

## 2017-08-04 LAB — COMPREHENSIVE METABOLIC PANEL
ALBUMIN: 4.2 g/dL (ref 3.5–5.0)
ALK PHOS: 72 U/L (ref 38–126)
ALK PHOS: 75 U/L (ref 38–126)
ALT: 47 U/L (ref 14–54)
ALT: 48 U/L (ref 14–54)
ANION GAP: 10 (ref 5–15)
AST: 38 U/L (ref 15–41)
AST: 38 U/L (ref 15–41)
Albumin: 4.3 g/dL (ref 3.5–5.0)
Anion gap: 11 (ref 5–15)
BUN: 24 mg/dL — ABNORMAL HIGH (ref 6–20)
BUN: 24 mg/dL — ABNORMAL HIGH (ref 6–20)
CALCIUM: 9.8 mg/dL (ref 8.9–10.3)
CO2: 20 mmol/L — AB (ref 22–32)
CO2: 20 mmol/L — ABNORMAL LOW (ref 22–32)
CREATININE: 1.03 mg/dL — AB (ref 0.44–1.00)
Calcium: 9.6 mg/dL (ref 8.9–10.3)
Chloride: 110 mmol/L (ref 101–111)
Chloride: 109 mmol/L (ref 101–111)
Creatinine, Ser: 1.09 mg/dL — ABNORMAL HIGH (ref 0.44–1.00)
GFR calc Af Amer: >60 mL/min (ref >60)
GFR calc non Af Amer: 56 mL/min — ABNORMAL LOW (ref >60)
GLUCOSE: 147 mg/dL — AB (ref 65–99)
Glucose, Bld: 133 mg/dL — ABNORMAL HIGH (ref 65–99)
POTASSIUM: 3.6 mmol/L (ref 3.5–5.1)
Potassium: 3.8 mmol/L (ref 3.5–5.1)
SODIUM: 140 mmol/L (ref 135–145)
Sodium: 140 mmol/L (ref 135–145)
Total Bilirubin: 0.7 mg/dL (ref 0.3–1.2)
Total Bilirubin: 0.7 mg/dL (ref 0.3–1.2)
Total Protein: 7.2 g/dL (ref 6.5–8.1)
Total Protein: 7.1 g/dL (ref 6.5–8.1)

## 2017-08-04 LAB — IRON AND TIBC
Iron: 112 ug/dL (ref 28–170)
SATURATION RATIOS: 29 % (ref 10.4–31.8)
TIBC: 386 ug/dL (ref 250–450)
UIBC: 274 ug/dL

## 2017-08-04 LAB — FERRITIN: Ferritin: 173 ng/mL (ref 11–307)

## 2017-08-11 ENCOUNTER — Ambulatory Visit (INDEPENDENT_AMBULATORY_CARE_PROVIDER_SITE_OTHER): Payer: Medicare Other | Admitting: Family Medicine

## 2017-08-11 ENCOUNTER — Encounter: Payer: Self-pay | Admitting: Family Medicine

## 2017-08-11 VITALS — BP 122/78 | HR 101 | Resp 16 | Ht 64.0 in | Wt 227.0 lb

## 2017-08-11 DIAGNOSIS — E785 Hyperlipidemia, unspecified: Secondary | ICD-10-CM

## 2017-08-11 DIAGNOSIS — E038 Other specified hypothyroidism: Secondary | ICD-10-CM

## 2017-08-11 DIAGNOSIS — R300 Dysuria: Secondary | ICD-10-CM | POA: Diagnosis not present

## 2017-08-11 DIAGNOSIS — F411 Generalized anxiety disorder: Secondary | ICD-10-CM

## 2017-08-11 DIAGNOSIS — I10 Essential (primary) hypertension: Secondary | ICD-10-CM | POA: Diagnosis not present

## 2017-08-11 LAB — POCT URINALYSIS DIPSTICK
BILIRUBIN UA: NEGATIVE
Glucose, UA: NEGATIVE
Leukocytes, UA: NEGATIVE
Nitrite, UA: NEGATIVE
PH UA: 5.5 (ref 5.0–8.0)
Protein, UA: NEGATIVE
RBC UA: NEGATIVE
Spec Grav, UA: 1.025 (ref 1.010–1.025)
Urobilinogen, UA: 1 E.U./dL

## 2017-08-11 MED ORDER — FLUCONAZOLE 150 MG PO TABS: 150.0000 mg | ORAL_TABLET | Freq: Once | ORAL | 0 refills | Status: DC

## 2017-08-11 MED ORDER — CIPROFLOXACIN HCL 500 MG PO TABS: 500.0000 mg | ORAL_TABLET | Freq: Two times a day (BID) | ORAL | 0 refills | Status: DC

## 2017-08-11 MED ORDER — FLUCONAZOLE 150 MG PO TABS: 150.0000 mg | ORAL_TABLET | Freq: Once | ORAL | 0 refills | Status: AC

## 2017-08-11 NOTE — Patient Instructions (Addendum)
F/U in 4 months, call if you need me before  3 days of ciprofloxacin is sent in , we will send urine for culture ,send  Message next week if you still have urinary symptoms  I will get back to you on pravachol    Thank you  for choosing [facility]. We consider it a privelige to serve you.  Delivering excellent health care in a caring and  compassionate way is our goal.  Partnering with you,  so that together we can achieve this goal is our strategy.

## 2017-08-13 ENCOUNTER — Telehealth: Payer: Self-pay | Admitting: *Deleted

## 2017-08-13 NOTE — Telephone Encounter (Signed)
Patient called and left message on the nurse line about a wellness visit she had this year, [facility] is stating they have not received records. Please advise [phone]

## 2017-08-13 NOTE — Telephone Encounter (Signed)
I would check with Rosaria Ferries. I don't know

## 2017-08-16 NOTE — Telephone Encounter (Signed)
Routed to [facility]

## 2017-08-18 ENCOUNTER — Ambulatory Visit (HOSPITAL_COMMUNITY): Payer: Medicare Other | Admitting: Adult Health

## 2017-08-22 DIAGNOSIS — I10 Essential (primary) hypertension: Secondary | ICD-10-CM

## 2017-08-22 DIAGNOSIS — R002 Palpitations: Secondary | ICD-10-CM

## 2017-08-22 HISTORY — DX: Palpitations: R00.2

## 2017-08-22 NOTE — Assessment & Plan Note (Signed)
Hyperlipidemia:Low fat diet discussed and encouraged.   Lipid Panel  Lab Results  Component Value Date   CHOL 175 08/04/2017   HDL 48 08/04/2017   LDLCALC 95 08/04/2017   LDLDIRECT 138 (H) 09/15/2012   TRIG 159 (H) 08/04/2017   CHOLHDL 3.6 08/04/2017   Need to check on pravachol, not at goal

## 2017-08-22 NOTE — Assessment & Plan Note (Signed)
Increased sue to spouse's health , no med change

## 2017-08-22 NOTE — Assessment & Plan Note (Signed)
Symptomatic, h/o interstitial cystitis. Normal UA, will treat empirically, pt to call back if symptoms persist or worsen

## 2017-08-22 NOTE — Assessment & Plan Note (Signed)
Controlled, no change in medication DASH diet and commitment to daily physical activity for a minimum of 30 minutes discussed and encouraged, as a part of hypertension management. The importance of attaining a healthy weight is also discussed.  BP/Weight 08/11/2017 04/05/2017 02/15/2017 12/24/2016 12/16/2016 09/18/2016 3/64/6803  Systolic BP 212 248 250 037 048 889 169  Diastolic BP 78 70 67 89 70 72 61  Wt. (Lbs) 227 230 - 240.8 241.04 233.08 -  BMI 38.96 39.48 - 41.33 41.37 40.01 -  Some encounter information is confidential and restricted. Go to Review Flowsheets activity to see all data.

## 2017-08-22 NOTE — Progress Notes (Signed)
[name]     MRN: [id number]      DOB: 1961-12-23   HPI Ms. Amble is here for follow up and re-evaluation of chronic medical conditions, medication management and review of any available recent lab and radiology data.  Preventive health is updated, specifically  Cancer screening and Immunization.   Questions or concerns regarding consultations or procedures which the PT has had in the interim are  addressed. The PT denies any adverse reactions to current medications since the last visit.  C/o dysuria and frequency  C/o increased stress and anxiety due to poor health of spouse Stopped pravachol, heard it was linked to cancer  ROS Denies recent fever or chills. Denies sinus pressure, nasal congestion, ear pain or sore throat. Denies chest congestion, productive cough or wheezing. Denies chest pains, palpitations and leg swelling Denies abdominal pain, nausea, vomiting,diarrhea or constipation.   Denies headaches, seizures, numbness, or tingling.  Denies skin break down or rash.   PE  BP 122/78   Pulse (!) 101   Resp 16   Ht 5\' 4"  (1.626 m)   Wt 227 lb (103 kg)   SpO2 95%   BMI 38.96 kg/m   Patient alert and oriented and in no cardiopulmonary distress.  HEENT: No facial asymmetry, EOMI,   oropharynx pink and moist.  Neck supple no JVD, no mass.  Chest: Clear to auscultation bilaterally.  CVS: S1, S2 no murmurs, no S3.Regular rate.  ABD: Soft non tender. No renal angle tenderness , mild suprapubic tenderness  Ext: No edema  MS: Adequate ROM spine, shoulders, hips and knees.  Skin: Intact, no ulcerations or rash noted.  Psych: Good eye contact, normal affect. Memory intact  anxious mildly  depressed appearing.  CNS: CN 2-12 intact, power,  normal throughout.no focal deficits noted.   Assessment & Plan  Hypothyroidism Controlled, no change in medication   Anxiety state Increased sue to spouse's health , no med change  Morbid  obesity Improved Patient re-educated about  the importance of commitment to a  minimum of 150 minutes of exercise per week.  The importance of healthy food choices with portion control discussed. Encouraged to start a food diary, count calories and to consider  joining a support group. Sample diet sheets offered. Goals set by the patient for the next several months.   Weight /BMI 08/11/2017 04/05/2017 12/24/2016  WEIGHT 227 lb 230 lb 240 lb 12.8 oz  HEIGHT 5\' 4"  5\' 4"  -  BMI 38.96 kg/m2 39.48 kg/m2 41.33 kg/m2  Some encounter information is confidential and restricted. Go to Review Flowsheets activity to see all data.      Essential hypertension Controlled, no change in medication DASH diet and commitment to daily physical activity for a minimum of 30 minutes discussed and encouraged, as a part of hypertension management. The importance of attaining a healthy weight is also discussed.  BP/Weight 08/11/2017 04/05/2017 02/15/2017 12/24/2016 12/16/2016 09/18/2016 2/63/7858  Systolic BP 850 277 412 878 676 720 947  Diastolic BP 78 70 67 89 70 72 61  Wt. (Lbs) 227 230 - 240.8 241.04 233.08 -  BMI 38.96 39.48 - 41.33 41.37 40.01 -  Some encounter information is confidential and restricted. Go to Review Flowsheets activity to see all data.       Hyperlipidemia LDL goal <100 Hyperlipidemia:Low fat diet discussed and encouraged.   Lipid Panel  Lab Results  Component Value Date   CHOL 175 08/04/2017   HDL 48 08/04/2017   LDLCALC 95  08/04/2017   LDLDIRECT 138 (H) 09/15/2012   TRIG 159 (H) 08/04/2017   CHOLHDL 3.6 08/04/2017   Need to check on pravachol, not at goal    Dysuria Symptomatic, h/o interstitial cystitis. Normal UA, will treat empirically, pt to call back if symptoms persist or worsen

## 2017-08-22 NOTE — Assessment & Plan Note (Signed)
Controlled, no change in medication  

## 2017-08-22 NOTE — Assessment & Plan Note (Signed)
Improved Patient re-educated about  the importance of commitment to a  minimum of 150 minutes of exercise per week.  The importance of healthy food choices with portion control discussed. Encouraged to start a food diary, count calories and to consider  joining a support group. Sample diet sheets offered. Goals set by the patient for the next several months.   Weight /BMI 08/11/2017 04/05/2017 12/24/2016  WEIGHT 227 lb 230 lb 240 lb 12.8 oz  HEIGHT 5\' 4"  5\' 4"  -  BMI 38.96 kg/m2 39.48 kg/m2 41.33 kg/m2  Some encounter information is confidential and restricted. Go to Review Flowsheets activity to see all data.

## 2017-09-05 ENCOUNTER — Encounter: Payer: Self-pay | Admitting: Family Medicine

## 2017-09-08 DIAGNOSIS — M5416 Radiculopathy, lumbar region: Secondary | ICD-10-CM | POA: Diagnosis not present

## 2017-09-08 DIAGNOSIS — M48061 Spinal stenosis, lumbar region without neurogenic claudication: Secondary | ICD-10-CM | POA: Diagnosis not present

## 2017-09-28 ENCOUNTER — Other Ambulatory Visit: Payer: Self-pay | Admitting: Family Medicine

## 2017-10-01 ENCOUNTER — Telehealth: Payer: Self-pay

## 2017-10-01 ENCOUNTER — Other Ambulatory Visit: Payer: Self-pay

## 2017-10-01 ENCOUNTER — Other Ambulatory Visit: Payer: Self-pay | Admitting: Family Medicine

## 2017-10-01 MED ORDER — TOPIRAMATE 100 MG PO TABS: 100.0000 mg | ORAL_TABLET | Freq: Every day | ORAL | 5 refills | Status: DC

## 2017-10-01 MED ORDER — ALPRAZOLAM 1 MG PO TABS: ORAL_TABLET | ORAL | 0 refills | Status: DC

## 2017-10-01 MED ORDER — ALPRAZOLAM 1 MG PO TABS
1.0000 mg | ORAL_TABLET | Freq: Two times a day (BID) | ORAL | 4 refills | Status: DC
Start: 2017-10-01 — End: 2018-01-13

## 2017-10-01 NOTE — Telephone Encounter (Signed)
Corrected  Script printed and faxed, pt aware

## 2017-10-01 NOTE — Progress Notes (Signed)
xanax

## 2017-10-01 NOTE — Telephone Encounter (Signed)
Pt I requesting a refill on Xanax.  We have it printed and ready for you to sign in the office.

## 2017-10-11 ENCOUNTER — Encounter: Payer: Self-pay | Admitting: Family Medicine

## 2017-10-11 ENCOUNTER — Ambulatory Visit (INDEPENDENT_AMBULATORY_CARE_PROVIDER_SITE_OTHER): Payer: Medicare Other | Admitting: Family Medicine

## 2017-10-11 VITALS — BP 110/74 | HR 110 | Temp 97.0°F | Resp 16 | Ht 64.0 in | Wt 226.0 lb

## 2017-10-11 DIAGNOSIS — Z0000 Encounter for general adult medical examination without abnormal findings: Secondary | ICD-10-CM | POA: Diagnosis not present

## 2017-10-11 DIAGNOSIS — E038 Other specified hypothyroidism: Secondary | ICD-10-CM

## 2017-10-11 DIAGNOSIS — Z1212 Encounter for screening for malignant neoplasm of rectum: Secondary | ICD-10-CM

## 2017-10-11 DIAGNOSIS — I10 Essential (primary) hypertension: Secondary | ICD-10-CM

## 2017-10-11 DIAGNOSIS — Z23 Encounter for immunization: Secondary | ICD-10-CM | POA: Diagnosis not present

## 2017-10-11 DIAGNOSIS — K219 Gastro-esophageal reflux disease without esophagitis: Secondary | ICD-10-CM

## 2017-10-11 DIAGNOSIS — E785 Hyperlipidemia, unspecified: Secondary | ICD-10-CM

## 2017-10-11 DIAGNOSIS — Z1211 Encounter for screening for malignant neoplasm of colon: Secondary | ICD-10-CM

## 2017-10-11 DIAGNOSIS — Z1231 Encounter for screening mammogram for malignant neoplasm of breast: Secondary | ICD-10-CM

## 2017-10-11 DIAGNOSIS — B0089 Other herpesviral infection: Secondary | ICD-10-CM

## 2017-10-11 MED ORDER — ACYCLOVIR 400 MG PO TABS: 400.0000 mg | ORAL_TABLET | Freq: Every day | ORAL | 0 refills | Status: DC

## 2017-10-11 NOTE — Progress Notes (Signed)
    [name]     MRN: [id number]      DOB: 11-17-1961  HPI: Patient is in for annual physical exam. Also requests that a letter be written on her behalf and sent to the [facility] regarding her mental health as far as her safety to carry a concealed weapon as she has a h/o inpatient psychiatric .carefor suicidal ideation in the past. The letter she provides clearly states that the letter is required from her mental health Provider, who I am not. As her PCP I am aware that she is not currently being treated by mental health , and it is my opinion that she does not require psychiatric care. She is neither suicidal or homicidal. I will send mail a letter to the [facility] department stating this as her PCP,. I am aware that she does get some counseling through the [place] as she recently reported that her spouse has been diagnosed with PTSD and early dementia and other mental health problems Recent labs,  are reviewed.She has decided to resume low dose pravachol after hearing that the benefit of CV protection is greater than the risk of developing breast cancer based on current studies C/o current active outbreak of oral herpes in the past 5 days and requests medication for this Immunization is reviewed , and  updated if needed.   PE: BP 110/74 (BP Location: Left Arm, Patient Position: Sitting, Cuff Size: Normal)   Pulse (!) 110   Temp (!) 97 F (36.1 C) (Other (Comment))   Resp 16   Ht 5\' 4"  (1.626 m)   Wt 226 lb (102.5 kg)   SpO2 97%   BMI 38.79 kg/m   Pleasant  female, alert and oriented x 3, in no cardio-pulmonary distress. Afebrile. HEENT No facial trauma or asymetry. Sinuses non tender.  Extra occullar muscles intact, t. External ears normal, Oropharynx moist, active oral herpes outbreak present Neck: supple, no adenopathy,JVD or thyromegaly.No bruits.  Chest: Clear to ascultation bilaterally.No crackles or wheezes. Non tender to palpation  Breast: Left mastectomy. Right breast  implant. tNo axillary or supraclavicular adenopathy  Cardiovascular system; Heart sounds normal,  S1 and  S2 ,no S3.  No murmur, or thrill. Apical beat not displaced Peripheral pulses normal.  Abdomen: Soft, diffuse superficial  tenderness, no organomegaly or masses. No bruits. Bowel sounds normal.  Rectal:  Not done  Painful hemorrhoids, reports bleeding all the time needs FIT testing  GU: Not examined , asymptomatic   Musculoskeletal exam: Decreased  ROM of spine,  Normal in hips , shoulders and knees. No deformity ,swelling or crepitus noted. No muscle wasting or atrophy.   Neurologic: Cranial nerves 2 to 12 intact. Power, tone ,sensation normal throughout. No disturbance in gait. No tremor.  Skin: Intact, no ulceration, erythema , scaling or rash noted. Pigmentation normal throughout  Psych; Normal mood and affect. Judgement and concentration normal Patient denies suicidal or homicidal ideation  Assessment & Plan:  Encounter for annual physical exam Annual exam as documented.  Immunization and cancer screening needs are specifically addressed at this visit.   Recurrent oral herpes simplex infection Current outbreak present, acyclovir prescribed

## 2017-10-11 NOTE — Patient Instructions (Addendum)
Wellness with MD before before year end  F/u with MD in 4.5 months  Fasting lipid, cmp and EGFr and tSH   Mammogram will be ordered when I determine due, likely in October  Fit testing for stool  Resume pravachol  Acyclovir to be sent for oral lesions to [facility] in [place] will be referred to dr Laural Golden to discuss symptoms suggestive of uncontrolled reflux and his best recommendation for colon screening and indication or other for colonscopy based on your anatomy  Flu vaccine today

## 2017-10-13 DIAGNOSIS — B0089 Other herpesviral infection: Secondary | ICD-10-CM

## 2017-10-13 HISTORY — DX: Other herpesviral infection: B00.89

## 2017-10-13 NOTE — Assessment & Plan Note (Signed)
Increased heartburn symptoms , substernal chest discomfort in past several months radiating to jaw, also needs GI recommendation re colon screening indicated, she  Is having cologard test done, has very short length of intestine so  Risk of or need for colonoscopy is is questionable wil have gI determine and make recommendation

## 2017-10-13 NOTE — Assessment & Plan Note (Signed)
Annual exam as documented. . Immunization and cancer screening needs are specifically addressed at this visit.  

## 2017-10-13 NOTE — Assessment & Plan Note (Signed)
Current outbreak present, acyclovir prescribed

## 2017-10-15 ENCOUNTER — Telehealth: Payer: Self-pay

## 2017-10-15 NOTE — Telephone Encounter (Signed)
-----   Message from Fayrene Helper, MD sent at 10/13/2017 12:30 PM EDT ----- Regarding: pls let me know when you try to schedule this mammogram if there is a problem  I am not certain if it is ordered correctly

## 2017-10-15 NOTE — Telephone Encounter (Signed)
I have called this patient several times to try and get any time restrictions she has so I can co ahead and schedule her mammo.  LMOM for her to call me back.

## 2017-10-18 ENCOUNTER — Telehealth: Payer: Self-pay | Admitting: Family Medicine

## 2017-10-18 ENCOUNTER — Other Ambulatory Visit: Payer: Self-pay | Admitting: Family Medicine

## 2017-10-18 DIAGNOSIS — Z1231 Encounter for screening mammogram for malignant neoplasm of breast: Secondary | ICD-10-CM

## 2017-10-18 DIAGNOSIS — Z1211 Encounter for screening for malignant neoplasm of colon: Secondary | ICD-10-CM | POA: Diagnosis not present

## 2017-10-18 DIAGNOSIS — Z1239 Encounter for other screening for malignant neoplasm of breast: Secondary | ICD-10-CM

## 2017-10-18 DIAGNOSIS — Z1212 Encounter for screening for malignant neoplasm of rectum: Secondary | ICD-10-CM | POA: Diagnosis not present

## 2017-10-18 NOTE — Telephone Encounter (Signed)
Updated mammo/us orders.

## 2017-10-19 LAB — COLOGUARD
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
## 2017-10-21 ENCOUNTER — Encounter (INDEPENDENT_AMBULATORY_CARE_PROVIDER_SITE_OTHER): Payer: Self-pay | Admitting: Internal Medicine

## 2017-10-26 ENCOUNTER — Other Ambulatory Visit: Payer: Self-pay | Admitting: Family Medicine

## 2017-10-26 ENCOUNTER — Telehealth: Payer: Self-pay | Admitting: Family Medicine

## 2017-10-26 DIAGNOSIS — Z1239 Encounter for other screening for malignant neoplasm of breast: Secondary | ICD-10-CM

## 2017-10-26 NOTE — Telephone Encounter (Signed)
Noted  

## 2017-10-26 NOTE — Telephone Encounter (Signed)
Radiology called. Patient was scheduled for mammo but since she has has mastectomy and implants, she needs ultrasounds. I have pended. Please sign.

## 2017-10-26 NOTE — Telephone Encounter (Signed)
Order is signed.

## 2017-10-27 ENCOUNTER — Encounter (HOSPITAL_COMMUNITY): Payer: Self-pay | Admitting: Psychiatry

## 2017-10-27 ENCOUNTER — Ambulatory Visit (INDEPENDENT_AMBULATORY_CARE_PROVIDER_SITE_OTHER): Payer: No Typology Code available for payment source | Admitting: Psychiatry

## 2017-10-27 ENCOUNTER — Telehealth: Payer: Self-pay

## 2017-10-27 VITALS — BP 108/75 | HR 85 | Ht 64.0 in | Wt 201.0 lb

## 2017-10-27 DIAGNOSIS — R000 Tachycardia, unspecified: Secondary | ICD-10-CM | POA: Diagnosis not present

## 2017-10-27 DIAGNOSIS — Z811 Family history of alcohol abuse and dependence: Secondary | ICD-10-CM | POA: Diagnosis not present

## 2017-10-27 DIAGNOSIS — F4320 Adjustment disorder, unspecified: Secondary | ICD-10-CM

## 2017-10-27 DIAGNOSIS — R109 Unspecified abdominal pain: Secondary | ICD-10-CM

## 2017-10-27 DIAGNOSIS — Z9889 Other specified postprocedural states: Secondary | ICD-10-CM

## 2017-10-27 DIAGNOSIS — F3340 Major depressive disorder, recurrent, in remission, unspecified: Secondary | ICD-10-CM | POA: Diagnosis not present

## 2017-10-27 DIAGNOSIS — F325 Major depressive disorder, single episode, in full remission: Secondary | ICD-10-CM

## 2017-10-27 HISTORY — DX: Major depressive disorder, single episode, in full remission: F32.5

## 2017-10-27 LAB — COLOGUARD: Cologuard: NEGATIVE

## 2017-10-27 NOTE — Telephone Encounter (Signed)
Radiology wants to know which breast needs the mammo, states history of left breast cancer but also states mastectomy on right breast and a right breast was ordered. Needs clarification on which side to screen

## 2017-10-27 NOTE — Patient Instructions (Signed)
1. The letter to [employer] of [facility] is provided.  2. Return to clinic as needed

## 2017-10-28 ENCOUNTER — Encounter (HOSPITAL_COMMUNITY): Payer: Medicare Other | Attending: Oncology

## 2017-10-28 DIAGNOSIS — D509 Iron deficiency anemia, unspecified: Secondary | ICD-10-CM | POA: Diagnosis not present

## 2017-10-28 DIAGNOSIS — R790 Abnormal level of blood mineral: Secondary | ICD-10-CM | POA: Diagnosis present

## 2017-10-28 LAB — CBC WITH DIFFERENTIAL/PLATELET
Basophils Absolute: 0 10*3/uL (ref 0.0–0.1)
Basophils Relative: 0 %
EOS ABS: 0.1 10*3/uL (ref 0.0–0.7)
EOS PCT: 1 %
HCT: 39.5 % (ref 36.0–46.0)
Hemoglobin: 13.4 g/dL (ref 12.0–15.0)
LYMPHS ABS: 2.7 10*3/uL (ref 0.7–4.0)
LYMPHS PCT: 44 %
MCH: 31 pg (ref 26.0–34.0)
MCHC: 33.9 g/dL (ref 30.0–36.0)
MCV: 91.4 fL (ref 78.0–100.0)
MONO ABS: 0.4 10*3/uL (ref 0.1–1.0)
Monocytes Relative: 7 %
Neutro Abs: 3 10*3/uL (ref 1.7–7.7)
Neutrophils Relative %: 48 %
PLATELETS: 201 10*3/uL (ref 150–400)
RBC: 4.32 MIL/uL (ref 3.87–5.11)
RDW: 13.7 % (ref 11.5–15.5)
WBC: 6.2 10*3/uL (ref 4.0–10.5)

## 2017-10-28 LAB — IRON AND TIBC
IRON: 134 ug/dL (ref 28–170)
SATURATION RATIOS: 34 % — AB (ref 10.4–31.8)
TIBC: 395 ug/dL (ref 250–450)
UIBC: 261 ug/dL

## 2017-10-28 LAB — COMPREHENSIVE METABOLIC PANEL
ALT: 32 U/L (ref 14–54)
ANION GAP: 10 (ref 5–15)
AST: 31 U/L (ref 15–41)
Albumin: 4.3 g/dL (ref 3.5–5.0)
Alkaline Phosphatase: 65 U/L (ref 38–126)
BUN: 23 mg/dL — ABNORMAL HIGH (ref 6–20)
CHLORIDE: 105 mmol/L (ref 101–111)
CO2: 23 mmol/L (ref 22–32)
CREATININE: 1.21 mg/dL — AB (ref 0.44–1.00)
Calcium: 9.5 mg/dL (ref 8.9–10.3)
GFR, EST AFRICAN AMERICAN: 57 mL/min — AB (ref >60)
GFR, EST NON AFRICAN AMERICAN: 49 mL/min — AB (ref >60)
Glucose, Bld: 112 mg/dL — ABNORMAL HIGH (ref 65–99)
POTASSIUM: 4 mmol/L (ref 3.5–5.1)
SODIUM: 138 mmol/L (ref 135–145)
Total Bilirubin: 0.7 mg/dL (ref 0.3–1.2)
Total Protein: 6.8 g/dL (ref 6.5–8.1)

## 2017-10-28 LAB — FERRITIN: FERRITIN: 230 ng/mL (ref 11–307)

## 2017-10-28 NOTE — Telephone Encounter (Signed)
Vinnie Level called from [facility] Radiology regarding message below. Please call Vinnie Level back at [phone]

## 2017-11-01 NOTE — Telephone Encounter (Signed)
Right breast is the breast being screened from patient's record review and this would  Be due in January, will call in am

## 2017-11-02 ENCOUNTER — Telehealth: Payer: Self-pay | Admitting: Family Medicine

## 2017-11-02 ENCOUNTER — Other Ambulatory Visit: Payer: Self-pay

## 2017-11-02 DIAGNOSIS — D509 Iron deficiency anemia, unspecified: Secondary | ICD-10-CM

## 2017-11-02 DIAGNOSIS — R790 Abnormal level of blood mineral: Secondary | ICD-10-CM

## 2017-11-02 MED ORDER — CYCLOBENZAPRINE HCL 10 MG PO TABS: ORAL_TABLET | ORAL | 0 refills | Status: DC

## 2017-11-02 MED ORDER — NEXIUM 40 MG PO CPDR: 40.0000 mg | DELAYED_RELEASE_CAPSULE | Freq: Two times a day (BID) | ORAL | 1 refills | Status: DC

## 2017-11-02 MED ORDER — DULOXETINE HCL 30 MG PO CPEP: 30.0000 mg | ORAL_CAPSULE | Freq: Two times a day (BID) | ORAL | 1 refills | Status: DC

## 2017-11-02 MED ORDER — ESTROGENS, CONJUGATED 0.625 MG/GM VA CREA: 1.0000 | TOPICAL_CREAM | Freq: Every day | VAGINAL | 1 refills | Status: DC

## 2017-11-02 MED ORDER — LEVOTHYROXINE SODIUM 125 MCG PO TABS: ORAL_TABLET | ORAL | 1 refills | Status: DC

## 2017-11-02 NOTE — Addendum Note (Signed)
Addended by: Fayrene Helper on: 11/02/2017 08:28 AM   Modules accepted: Orders

## 2017-11-02 NOTE — Addendum Note (Signed)
Addended by: Fayrene Helper on: 11/02/2017 08:26 AM   Modules accepted: Orders

## 2017-11-02 NOTE — Telephone Encounter (Signed)
Lily, pharmacy technician with [facility], called and left a message on nurse line regarding patient. She is requesting refills of prescription nexium 40 mg, premarin 30 g, levothyroxine 125 mcg, cyclobenzaprine 10 mg.   Callback for verbal prescription [phone] Fax for new prescriptions [phone]

## 2017-11-03 NOTE — Telephone Encounter (Signed)
meds refilled 

## 2017-11-04 ENCOUNTER — Telehealth: Payer: Self-pay | Admitting: Family Medicine

## 2017-11-04 NOTE — Telephone Encounter (Signed)
I spoke with the patient and confirmed that  Imaging is done only on the right breast she wants this at the breast center and it is due in January

## 2017-11-08 ENCOUNTER — Encounter (HOSPITAL_COMMUNITY): Payer: Medicare Other

## 2017-11-08 ENCOUNTER — Other Ambulatory Visit: Payer: Self-pay | Admitting: Family Medicine

## 2017-11-08 ENCOUNTER — Ambulatory Visit (INDEPENDENT_AMBULATORY_CARE_PROVIDER_SITE_OTHER): Payer: Self-pay | Admitting: Internal Medicine

## 2017-11-08 DIAGNOSIS — N644 Mastodynia: Secondary | ICD-10-CM

## 2017-11-09 ENCOUNTER — Telehealth: Payer: Self-pay | Admitting: Family Medicine

## 2017-11-09 NOTE — Telephone Encounter (Signed)
[facility], left message on nurse line regarding patient. They states they need clarification on the directions for patient's premarin cream.   Callback# [phone] Reference # [phone]

## 2017-11-11 ENCOUNTER — Other Ambulatory Visit: Payer: Self-pay

## 2017-11-11 MED ORDER — VERAPAMIL HCL 120 MG PO TABS: ORAL_TABLET | ORAL | 1 refills | Status: DC

## 2017-11-11 NOTE — Telephone Encounter (Signed)
Spoke with [facility] and gave verbal order for 2 meds

## 2017-11-15 ENCOUNTER — Telehealth: Payer: Self-pay | Admitting: Family Medicine

## 2017-11-15 ENCOUNTER — Other Ambulatory Visit: Payer: Self-pay

## 2017-11-15 MED ORDER — CYCLOBENZAPRINE HCL 10 MG PO TABS: ORAL_TABLET | ORAL | 1 refills | Status: DC

## 2017-11-15 NOTE — Telephone Encounter (Signed)
Patient left message on nurse line regarding cyclobenzaprine refill. She states pharmacy told her they faxed a request here over a week ago and have not heard anything. Callback# [phone]

## 2017-11-15 NOTE — Telephone Encounter (Signed)
This was faxed to [facility] on 11/02/17 and I just refaxed it again just now. If they continue to not get it, she can come collect or I can send somewhere locally. Thanks

## 2017-11-20 ENCOUNTER — Encounter: Payer: Self-pay | Admitting: Family Medicine

## 2017-11-28 ENCOUNTER — Telehealth: Payer: Self-pay | Admitting: Family Medicine

## 2017-11-28 MED ORDER — ACYCLOVIR 5 % EX CREA: TOPICAL_CREAM | CUTANEOUS | 1 refills | Status: DC

## 2017-11-28 NOTE — Telephone Encounter (Signed)
I spoke with the pt and will send [facility] [place]

## 2017-11-29 ENCOUNTER — Ambulatory Visit (INDEPENDENT_AMBULATORY_CARE_PROVIDER_SITE_OTHER): Payer: Self-pay | Admitting: Internal Medicine

## 2017-12-01 DIAGNOSIS — M5416 Radiculopathy, lumbar region: Secondary | ICD-10-CM | POA: Diagnosis not present

## 2017-12-01 DIAGNOSIS — M48061 Spinal stenosis, lumbar region without neurogenic claudication: Secondary | ICD-10-CM | POA: Diagnosis not present

## 2017-12-07 ENCOUNTER — Ambulatory Visit: Payer: Medicare Other | Admitting: Family Medicine

## 2017-12-13 ENCOUNTER — Encounter: Payer: Medicare Other | Admitting: Family Medicine

## 2017-12-21 NOTE — Telephone Encounter (Signed)
No notes for this encounter

## 2017-12-28 HISTORY — PX: BREAST BIOPSY: SHX20

## 2017-12-31 ENCOUNTER — Other Ambulatory Visit: Payer: Self-pay | Admitting: Family Medicine

## 2017-12-31 DIAGNOSIS — Z1231 Encounter for screening mammogram for malignant neoplasm of breast: Secondary | ICD-10-CM

## 2018-01-13 ENCOUNTER — Ambulatory Visit: Payer: Self-pay | Admitting: Family Medicine

## 2018-01-13 ENCOUNTER — Encounter: Payer: Self-pay | Admitting: Family Medicine

## 2018-01-13 ENCOUNTER — Ambulatory Visit (INDEPENDENT_AMBULATORY_CARE_PROVIDER_SITE_OTHER): Payer: Medicare Other | Admitting: Family Medicine

## 2018-01-13 ENCOUNTER — Telehealth: Payer: Self-pay | Admitting: Family Medicine

## 2018-01-13 VITALS — BP 114/82 | HR 94 | Resp 16 | Ht 64.0 in | Wt 216.0 lb

## 2018-01-13 DIAGNOSIS — D509 Iron deficiency anemia, unspecified: Secondary | ICD-10-CM

## 2018-01-13 DIAGNOSIS — E038 Other specified hypothyroidism: Secondary | ICD-10-CM

## 2018-01-13 DIAGNOSIS — R790 Abnormal level of blood mineral: Secondary | ICD-10-CM

## 2018-01-13 DIAGNOSIS — E785 Hyperlipidemia, unspecified: Secondary | ICD-10-CM

## 2018-01-13 DIAGNOSIS — Z0000 Encounter for general adult medical examination without abnormal findings: Secondary | ICD-10-CM

## 2018-01-13 DIAGNOSIS — E8881 Metabolic syndrome: Secondary | ICD-10-CM

## 2018-01-13 MED ORDER — VERAPAMIL HCL 120 MG PO TABS: ORAL_TABLET | ORAL | 1 refills | Status: DC

## 2018-01-13 MED ORDER — ALPRAZOLAM 1 MG PO TABS: 1.0000 mg | ORAL_TABLET | Freq: Two times a day (BID) | ORAL | 4 refills | Status: DC

## 2018-01-13 MED ORDER — DULOXETINE HCL 30 MG PO CPEP
30.0000 mg | ORAL_CAPSULE | Freq: Two times a day (BID) | ORAL | 1 refills | Status: DC
Start: 2018-01-13 — End: 2018-08-25

## 2018-01-13 MED ORDER — CYCLOBENZAPRINE HCL 10 MG PO TABS: ORAL_TABLET | ORAL | 1 refills | Status: DC

## 2018-01-13 MED ORDER — NEXIUM 40 MG PO CPDR: 40.0000 mg | DELAYED_RELEASE_CAPSULE | Freq: Two times a day (BID) | ORAL | 1 refills | Status: DC

## 2018-01-13 MED ORDER — FLUTICASONE PROPIONATE 50 MCG/ACT NA SUSP: 2.0000 | Freq: Every day | NASAL | 1 refills | Status: DC

## 2018-01-13 MED ORDER — LEVOTHYROXINE SODIUM 125 MCG PO TABS: ORAL_TABLET | ORAL | 1 refills | Status: DC

## 2018-01-13 MED ORDER — TOPIRAMATE 100 MG PO TABS: 100.0000 mg | ORAL_TABLET | Freq: Every day | ORAL | 5 refills | Status: DC

## 2018-01-13 NOTE — Progress Notes (Signed)
Preventive Screening-Counseling & Management   Patient present here today for a Medicare annual wellness visit.   Current Problems (verified)   Medications Prior to Visit Allergies (verified)   PAST HISTORY  Family History (verified)   Social History Married, 1 biological child, disabled since 1998 from SVT   Risk Factors  Current exercise habits:  Has recently started he Keto diet and walking everyday around the neighborhood   Dietary issues discussed: eats variety of fruits and vegetables and limits fried fatty foods    Cardiac risk factors: had SVT and ivc filter placed, HTN  Depression Screen  (Note: if answer to either of the following is "Yes", a more complete depression screening is indicated)   Over the past two weeks, have you felt down, depressed or hopeless? No  Over the past two weeks, have you felt little interest or pleasure in doing things? No  Have you lost interest or pleasure in daily life? No  Do you often feel hopeless? No  Do you cry easily over simple problems? No   Activities of Daily Living  In your present state of health, do you have any difficulty performing the following activities?  Driving?: No Managing money?: No Feeding yourself?:No Getting from bed to chair?:No Climbing a flight of stairs? Unable to do because of her back pain  Preparing food and eating?:No Bathing or showering?:No Getting dressed?:No Getting to the toilet?:No Using the toilet?:No Moving around from place to place?: has a lot of back pain but able to get around without ambulatory device  Fall Risk Assessment In the past year have you fallen or had a near fall?:No Are you currently taking any medications that make you dizzy?:No   Hearing Difficulties: No Do you often ask people to speak up or repeat themselves?:No Do you experience ringing or noises in your ears?:No Do you have difficulty understanding soft or whispered voices?:No  Cognitive Testing  Alert? Yes  Normal Appearance?Yes  Oriented to person? Yes Place? Yes  Time? Yes  Displays appropriate judgment?Yes  Can read the correct time from a watch face? yes Are you having problems remembering things?No  Advanced Directives have been discussed with the patient?Yes, has onfile    List the Names of Other Physician/Practitioners you currently use:  updated  Indicate any recent Medical Services you may have received from other than [facility] providers in the past year (date may be approximate).     Medicare Attestation  I have personally reviewed:  [name] medical and social history  Their use of alcohol, tobacco or illicit drugs  Their current medications and supplements  [name] functional ability including ADLs,fall risks, home safety risks, cognitive, and hearing and visual impairment  Diet and physical activities  Evidence for depression or mood disorders  [name] weight, height, BMI, and visual acuity have been recorded in the chart. I have made referrals, counseling, and provided education to the patient based on review of the above and I have provided the patient with a written personalized care plan for preventive services.    Physical Exam BP 114/82   Pulse 94   Resp 16   Ht 5\' 4"  (1.626 m)   Wt 216 lb (98 kg)   SpO2 95%   BMI 37.08 kg/m    Assessment & Plan:  Medicare annual wellness visit, subsequent Annual exam as documented. Counseling done  re healthy lifestyle involving commitment to 150 minutes exercise per week, heart healthy diet, and attaining healthy weight.The importance of adequate  sleep also discussed. Regular seat belt use and home safety, is also discussed. Changes in health habits are decided on by the patient with goals and time frames  set for achieving them. Immunization and cancer screening needs are specifically addressed at this visit.

## 2018-01-13 NOTE — Patient Instructions (Addendum)
F/u in 6 months, call if you need me sooner  Fasting labs within the next 5 days please   Congrats on weight loss and through change in diet and regular exercise  Mammogram when due and also all the best with upcoming planned surgery  Discontinue pravastatin and please limit your fatty foods  Thank you  for choosing [facility]. We consider it a privelige to serve you.  Delivering excellent health care in a caring and  compassionate way is our goal.  Partnering with you,  so that together we can achieve this goal is our strategy.

## 2018-01-17 ENCOUNTER — Encounter: Payer: Self-pay | Admitting: Family Medicine

## 2018-01-17 NOTE — Assessment & Plan Note (Signed)

## 2018-01-21 ENCOUNTER — Ambulatory Visit: Payer: Self-pay

## 2018-01-24 ENCOUNTER — Ambulatory Visit (HOSPITAL_COMMUNITY): Payer: Medicare Other

## 2018-01-28 ENCOUNTER — Ambulatory Visit
Admission: RE | Admit: 2018-01-28 | Discharge: 2018-01-28 | Disposition: A | Discharge status: Home / Self Care | Payer: Medicare Other | Source: Ambulatory Visit | Attending: Family Medicine | Admitting: Family Medicine

## 2018-01-28 DIAGNOSIS — Z1231 Encounter for screening mammogram for malignant neoplasm of breast: Secondary | ICD-10-CM

## 2018-01-31 ENCOUNTER — Other Ambulatory Visit: Payer: Self-pay | Admitting: Family Medicine

## 2018-01-31 DIAGNOSIS — N644 Mastodynia: Secondary | ICD-10-CM

## 2018-02-09 ENCOUNTER — Ambulatory Visit: Payer: Self-pay

## 2018-02-10 ENCOUNTER — Ambulatory Visit: Payer: Self-pay

## 2018-02-10 ENCOUNTER — Ambulatory Visit
Admission: RE | Admit: 2018-02-10 | Discharge: 2018-02-10 | Disposition: A | Discharge status: Home / Self Care | Payer: Medicare Other | Source: Ambulatory Visit | Attending: Family Medicine | Admitting: Family Medicine

## 2018-02-10 ENCOUNTER — Other Ambulatory Visit: Payer: Self-pay | Admitting: Family Medicine

## 2018-02-10 DIAGNOSIS — R928 Other abnormal and inconclusive findings on diagnostic imaging of breast: Secondary | ICD-10-CM | POA: Diagnosis not present

## 2018-02-10 DIAGNOSIS — N644 Mastodynia: Secondary | ICD-10-CM

## 2018-02-23 ENCOUNTER — Other Ambulatory Visit: Payer: Self-pay | Admitting: Family Medicine

## 2018-02-23 ENCOUNTER — Ambulatory Visit: Payer: Self-pay | Admitting: Family Medicine

## 2018-02-23 ENCOUNTER — Ambulatory Visit: Payer: Medicare Other | Admitting: Family Medicine

## 2018-02-23 ENCOUNTER — Telehealth: Payer: Self-pay | Admitting: Family Medicine

## 2018-02-23 MED ORDER — POTASSIUM CHLORIDE ER 10 MEQ PO TBCR: 10.0000 meq | EXTENDED_RELEASE_TABLET | Freq: Every day | ORAL | 2 refills | Status: DC

## 2018-02-23 NOTE — Telephone Encounter (Signed)
Called pt to advise RX was sent in .

## 2018-02-23 NOTE — Telephone Encounter (Signed)
Waking up in the middle of the night with leg cramps, she bought some over the counter Potassium pills that she picked up today. She is requesting that you call her in a RX for potassium pills. Started on 02-16-18

## 2018-02-23 NOTE — Telephone Encounter (Signed)
[facility] [phone] - [place], [place]

## 2018-02-23 NOTE — Telephone Encounter (Signed)
Although her potassium is normal , I sent in  A low dose as it may help her symptoms

## 2018-02-25 DIAGNOSIS — Y33XXXA Other specified events, undetermined intent, initial encounter: Secondary | ICD-10-CM | POA: Diagnosis not present

## 2018-02-25 DIAGNOSIS — S32039A Unspecified fracture of third lumbar vertebra, initial encounter for closed fracture: Secondary | ICD-10-CM | POA: Diagnosis not present

## 2018-02-25 DIAGNOSIS — Z189 Retained foreign body fragments, unspecified material: Secondary | ICD-10-CM | POA: Diagnosis not present

## 2018-02-25 DIAGNOSIS — Z95828 Presence of other vascular implants and grafts: Secondary | ICD-10-CM | POA: Diagnosis not present

## 2018-02-28 ENCOUNTER — Ambulatory Visit: Payer: Medicare Other | Admitting: Family Medicine

## 2018-02-28 DIAGNOSIS — Z79891 Long term (current) use of opiate analgesic: Secondary | ICD-10-CM | POA: Diagnosis not present

## 2018-02-28 DIAGNOSIS — F1190 Opioid use, unspecified, uncomplicated: Secondary | ICD-10-CM | POA: Diagnosis not present

## 2018-02-28 DIAGNOSIS — M48061 Spinal stenosis, lumbar region without neurogenic claudication: Secondary | ICD-10-CM | POA: Diagnosis not present

## 2018-02-28 DIAGNOSIS — G894 Chronic pain syndrome: Secondary | ICD-10-CM | POA: Diagnosis not present

## 2018-03-01 DIAGNOSIS — Z189 Retained foreign body fragments, unspecified material: Secondary | ICD-10-CM | POA: Insufficient documentation

## 2018-03-08 ENCOUNTER — Ambulatory Visit (HOSPITAL_COMMUNITY): Payer: Self-pay | Admitting: Adult Health

## 2018-04-03 ENCOUNTER — Other Ambulatory Visit: Payer: Self-pay | Admitting: Family Medicine

## 2018-04-04 NOTE — Telephone Encounter (Signed)
Patient is requesting refill for generic xanex. Pharmacy: [facility] in [place] Cb#: [phone]

## 2018-04-05 ENCOUNTER — Other Ambulatory Visit: Payer: Self-pay | Admitting: Family Medicine

## 2018-04-05 DIAGNOSIS — R790 Abnormal level of blood mineral: Secondary | ICD-10-CM

## 2018-04-05 DIAGNOSIS — D509 Iron deficiency anemia, unspecified: Secondary | ICD-10-CM

## 2018-04-06 NOTE — Telephone Encounter (Signed)
Per the Med list 4 refills, start date 01-13-18,, I advised the pt that we had the note, and that Dr Moshe Cipro was unavailable, and a nurse would call her back

## 2018-04-07 NOTE — Telephone Encounter (Signed)
Left a message for patient to return call.

## 2018-04-07 NOTE — Telephone Encounter (Signed)
Patient returned your call to discuss refill  Cb#: [phone]

## 2018-04-08 NOTE — Telephone Encounter (Signed)
I spoke to patient yesterday and informed her that I could not refill medication without Dr. Moshe Cipro being in the office. I advised her to wait until Velna Hatchet returns or Dr. Moshe Cipro returns

## 2018-04-12 ENCOUNTER — Other Ambulatory Visit: Payer: Self-pay | Admitting: Family Medicine

## 2018-04-13 ENCOUNTER — Telehealth: Payer: Self-pay | Admitting: Family Medicine

## 2018-04-13 DIAGNOSIS — E039 Hypothyroidism, unspecified: Secondary | ICD-10-CM

## 2018-04-13 DIAGNOSIS — I10 Essential (primary) hypertension: Secondary | ICD-10-CM

## 2018-04-13 DIAGNOSIS — E8881 Metabolic syndrome: Secondary | ICD-10-CM

## 2018-04-13 DIAGNOSIS — D509 Iron deficiency anemia, unspecified: Secondary | ICD-10-CM

## 2018-04-13 NOTE — Addendum Note (Signed)
Addended by: Eual Fines on: 04/13/2018 10:05 AM   Modules accepted: Orders

## 2018-04-13 NOTE — Telephone Encounter (Signed)
Done

## 2018-04-13 NOTE — Telephone Encounter (Signed)
Pt LVM to send LAB orders to [facility], and she needed Hemoglobin & Fearintine ordered also.

## 2018-04-19 ENCOUNTER — Telehealth: Payer: Self-pay | Admitting: Family Medicine

## 2018-04-19 NOTE — Telephone Encounter (Signed)
PT is calling in wanting a referral to Dr Rocky Morel with [facility], Ph [phone]  Right implant has slow leak causing scar tissue to build up, ( per [facility])

## 2018-04-20 LAB — CMP14+EGFR
A/G RATIO: 1.9 (ref 1.2–2.2)
ALK PHOS: 68 IU/L (ref 39–117)
ALT: 16 IU/L (ref 0–32)
AST: 18 IU/L (ref 0–40)
Albumin: 4.4 g/dL (ref 3.5–5.5)
BILIRUBIN TOTAL: 0.4 mg/dL (ref 0.0–1.2)
BUN/Creatinine Ratio: 22 (ref 9–23)
BUN: 22 mg/dL (ref 6–24)
CHLORIDE: 107 mmol/L — AB (ref 96–106)
CO2: 21 mmol/L (ref 20–29)
Calcium: 9.8 mg/dL (ref 8.7–10.2)
Creatinine, Ser: 0.99 mg/dL (ref 0.57–1.00)
GFR calc non Af Amer: 64 mL/min/{1.73_m2} (ref >59)
GFR, EST AFRICAN AMERICAN: 74 mL/min/{1.73_m2} (ref >59)
Globulin, Total: 2.3 g/dL (ref 1.5–4.5)
Glucose: 90 mg/dL (ref 65–99)
POTASSIUM: 4 mmol/L (ref 3.5–5.2)
Sodium: 144 mmol/L (ref 134–144)
TOTAL PROTEIN: 6.7 g/dL (ref 6.0–8.5)

## 2018-04-20 LAB — IRON: Iron: 117 ug/dL (ref 27–159)

## 2018-04-20 LAB — TSH: TSH: 1.21 u[IU]/mL (ref 0.450–4.500)

## 2018-04-20 LAB — LIPID PANEL
CHOL/HDL RATIO: 4.1 ratio (ref 0.0–4.4)
Cholesterol, Total: 188 mg/dL (ref 100–199)
HDL: 46 mg/dL (ref >39)
LDL CALC: 114 mg/dL — AB (ref 0–99)
Triglycerides: 141 mg/dL (ref 0–149)
VLDL CHOLESTEROL CAL: 28 mg/dL (ref 5–40)

## 2018-04-20 LAB — FERRITIN: FERRITIN: 256 ng/mL — AB (ref 15–150)

## 2018-04-22 ENCOUNTER — Encounter: Payer: Self-pay | Admitting: Family Medicine

## 2018-04-22 NOTE — Telephone Encounter (Signed)
I sent her a my chart message asking for more detail as to what type of specialist this was...plastic surgeon or what? I sent it with her result note. So I am waiting for a response before actually referring

## 2018-04-24 ENCOUNTER — Other Ambulatory Visit: Payer: Self-pay | Admitting: Family Medicine

## 2018-04-24 DIAGNOSIS — T8544XA Capsular contracture of breast implant, initial encounter: Secondary | ICD-10-CM

## 2018-04-25 NOTE — Telephone Encounter (Signed)
This is completed , referral is sent

## 2018-05-03 ENCOUNTER — Other Ambulatory Visit: Payer: Self-pay | Admitting: Family Medicine

## 2018-05-06 ENCOUNTER — Other Ambulatory Visit: Payer: Self-pay | Admitting: Family Medicine

## 2018-05-25 DIAGNOSIS — M48061 Spinal stenosis, lumbar region without neurogenic claudication: Secondary | ICD-10-CM | POA: Diagnosis not present

## 2018-05-25 DIAGNOSIS — Z95828 Presence of other vascular implants and grafts: Secondary | ICD-10-CM | POA: Diagnosis not present

## 2018-05-25 DIAGNOSIS — G894 Chronic pain syndrome: Secondary | ICD-10-CM | POA: Diagnosis not present

## 2018-05-25 DIAGNOSIS — Z189 Retained foreign body fragments, unspecified material: Secondary | ICD-10-CM | POA: Diagnosis not present

## 2018-06-13 ENCOUNTER — Ambulatory Visit: Payer: Self-pay | Admitting: Family Medicine

## 2018-06-15 ENCOUNTER — Encounter: Payer: Self-pay | Admitting: Family Medicine

## 2018-06-15 DIAGNOSIS — R319 Hematuria, unspecified: Secondary | ICD-10-CM | POA: Diagnosis not present

## 2018-06-15 DIAGNOSIS — Z853 Personal history of malignant neoplasm of breast: Secondary | ICD-10-CM | POA: Diagnosis not present

## 2018-06-15 DIAGNOSIS — Z87440 Personal history of urinary (tract) infections: Secondary | ICD-10-CM | POA: Diagnosis not present

## 2018-06-15 DIAGNOSIS — Z86711 Personal history of pulmonary embolism: Secondary | ICD-10-CM | POA: Diagnosis not present

## 2018-06-15 DIAGNOSIS — Z79899 Other long term (current) drug therapy: Secondary | ICD-10-CM | POA: Diagnosis not present

## 2018-06-15 DIAGNOSIS — N390 Urinary tract infection, site not specified: Secondary | ICD-10-CM | POA: Diagnosis not present

## 2018-06-15 DIAGNOSIS — R110 Nausea: Secondary | ICD-10-CM | POA: Diagnosis not present

## 2018-06-15 DIAGNOSIS — M797 Fibromyalgia: Secondary | ICD-10-CM | POA: Diagnosis not present

## 2018-06-15 DIAGNOSIS — Z9012 Acquired absence of left breast and nipple: Secondary | ICD-10-CM | POA: Diagnosis not present

## 2018-06-23 ENCOUNTER — Ambulatory Visit: Payer: Medicare Other | Admitting: Family Medicine

## 2018-06-23 ENCOUNTER — Encounter: Payer: Self-pay | Admitting: Family Medicine

## 2018-06-23 VITALS — BP 104/70 | HR 89 | Resp 16 | Ht 64.0 in | Wt 203.0 lb

## 2018-06-23 DIAGNOSIS — E785 Hyperlipidemia, unspecified: Secondary | ICD-10-CM | POA: Diagnosis not present

## 2018-06-23 DIAGNOSIS — E038 Other specified hypothyroidism: Secondary | ICD-10-CM

## 2018-06-23 DIAGNOSIS — R300 Dysuria: Secondary | ICD-10-CM

## 2018-06-23 DIAGNOSIS — M5440 Lumbago with sciatica, unspecified side: Secondary | ICD-10-CM | POA: Diagnosis not present

## 2018-06-23 DIAGNOSIS — Z09 Encounter for follow-up examination after completed treatment for conditions other than malignant neoplasm: Secondary | ICD-10-CM

## 2018-06-23 DIAGNOSIS — F411 Generalized anxiety disorder: Secondary | ICD-10-CM | POA: Diagnosis not present

## 2018-06-23 DIAGNOSIS — M549 Dorsalgia, unspecified: Secondary | ICD-10-CM

## 2018-06-23 DIAGNOSIS — E049 Nontoxic goiter, unspecified: Secondary | ICD-10-CM

## 2018-06-23 LAB — POCT URINALYSIS DIPSTICK
Appearance: NORMAL
Bilirubin, UA: NEGATIVE
Blood, UA: NEGATIVE
GLUCOSE UA: NEGATIVE
KETONES UA: NEGATIVE
Leukocytes, UA: NEGATIVE
Nitrite, UA: NEGATIVE
Odor: NORMAL
Protein, UA: NEGATIVE
Urobilinogen, UA: 0.2 E.U./dL
pH, UA: 6 (ref 5.0–8.0)

## 2018-06-23 MED ORDER — FLUCONAZOLE 150 MG PO TABS: ORAL_TABLET | ORAL | 0 refills | Status: DC

## 2018-06-23 MED ORDER — METHYLPREDNISOLONE ACETATE 80 MG/ML IJ SUSP
80.0000 mg | Freq: Once | INTRAMUSCULAR | Status: AC
  Administered 2018-06-23: 80 mg via INTRAMUSCULAR

## 2018-06-23 MED ORDER — KETOROLAC TROMETHAMINE 60 MG/2ML IM SOLN
60.0000 mg | Freq: Once | INTRAMUSCULAR | Status: AC
  Administered 2018-06-23: 60 mg via INTRAMUSCULAR

## 2018-06-23 NOTE — Progress Notes (Signed)
[name]     MRN: [id number]      DOB: 1961/12/07   HPI [name] is here for follow up of recent visit to urgent care at [facility] one week ago , she was treated with doxycycline , her c/s is available, and doxycycline sensitivity is not specifically tested for and she has multiple drug resistance reported. Still reports mild residual symptoms, no using azo, denies flank pain, fever or, chill or suprapubic pain . She is also here  For re-evaluation of chronic medical conditions, medication management and review of any available recent lab and radiology data.  Preventive health is updated, specifically  Cancer screening and Immunization.   Questions or concerns regarding consultations or procedures which the PT has had in the interim are  addressed. Has upcoming surgery proposed regarding a greenfield filter which is displaced and is causing chronic back pain and  which will involve 2 steps , one being out of state, she is going slowly on this She continues to focus on healthy lifestyle with exercise and good food choice with ongoing weight loss, she describes positive changes in her marriage , and is absolutely enjoin her grandson and the relationship with her daughter ROS Denies current  fever or chills. Denies sinus pressure, nasal congestion, ear pain or sore throat. Denies chest congestion, productive cough or wheezing. Denies chest pains, palpitations and leg swelling Denies abdominal pain, nausea, vomiting,diarrhea or constipation.    Denies headaches, seizures, numbness, or tingling. Denies depression, she does have chronic anxiety and  Insomnia which is managed with medication Denies skin break down or rash.   PE BP 104/70   Pulse 89   Resp 16   Ht 5\' 4"  (1.626 m)   Wt 203 lb (92.1 kg)   SpO2 97%   BMI 34.84 kg/m    BP 104/70   Pulse 89   Resp 16   Ht 5\' 4"  (1.626 m)   Wt 203 lb (92.1 kg)   SpO2 97%   BMI 34.84 kg/m   Patient alert and oriented and in no  cardiopulmonary distress.  HEENT: No facial asymmetry, EOMI,   oropharynx pink and moist.  Neck supple no JVD, goiter  Chest: Clear to auscultation bilaterally.  CVS: S1, S2 no murmurs, no S3.Regular rate.  ABD: Soft non tender. No renal angle or suprapubic tenderness. Large reproducible ventral hernia  Ext: No edema  MS: Adequate though reduced ROM lumbar spine, normal in shoulders, hips and knees.  Skin: Intact, no ulcerations or rash noted.  Psych: Good eye contact, normal affect. Memory intact not anxious or depressed appearing.  CNS: CN 2-12 intact, power,  normal throughout.no focal deficits noted.   Assessment & Plan  Hypothyroidism Goiter abnormal TSH requiring long term replacement , needs [place]  Dysuria Recently treated for UTI, still mildly symptomatic, CCUA in the office is normal. She is reassured, she will contact me if her symptoms worsen or persist for referral to urology, has h/o interstitial cystitis, holding off on cystoscopy at this time , which is very reasonable in light of normal UA  Encounter for examination following treatment at hospital Treated at [facility] emergency room 1 week prior for UTI still mildly symptomatic , however  No fever, chills or flank pain,UA in office is normal  Back pain with radiation Uncontrolled.Toradol and depo medrol administered IM in the office , to be followed by a short course of oral prednisone    Anxiety state Controlled, no change in medication  Hyperlipidemia LDL goal <100 Hyperlipidemia:Low fat diet discussed and encouraged.   Lipid Panel  Lab Results  Component Value Date   CHOL 188 04/19/2018   HDL 46 04/19/2018   LDLCALC 114 (H) 04/19/2018   LDLDIRECT 138 (H) 09/15/2012   TRIG 141 04/19/2018   CHOLHDL 4.1 04/19/2018   Updated lab needed at/ before next visit. \

## 2018-06-23 NOTE — Assessment & Plan Note (Addendum)
Goiter abnormal TSH requiring long term replacement , needs [place]

## 2018-06-23 NOTE — Patient Instructions (Addendum)
Annual physical exam in early October , call if you need me before  You are referred for thyroid US  Toradol 60 mg IM and depo medrol 80 mg IM in office today for back pain also 5 days of prednisone  Medications are refilled  CBC, iron , ferritin, TSH, fasting lipid, cmp and EGFR HBA1C Vit D, and tSH 1 week before next visit  Congrats on weight loss  It is important that you exercise regularly at least 30 minutes 5 times a week. If you develop chest pain, have severe difficulty breathing, or feel very tired, stop exercising immediately and seek medical attention   Please work on good  health habits so that your health will improve. 1. Commitment to daily physical activity for 30 to 60  minutes, if you are able to do this.  2. Commitment to wise food choices. Aim for half of your  food intake to be vegetable and fruit, one quarter starchy foods, and one quarter protein. Try to eat on a regular schedule  3 meals per day, snacking between meals should be limited to vegetables or fruits or small portions of nuts. 64 ounces of water per day is generally recommended, unless you have specific health conditions, like heart failure or kidney failure where you will need to limit fluid intake.  3. Commitment to sufficient and a  good quality of physical and mental rest daily, generally between 6 to 8 hours per day.  WITH PERSISTANCE AND PERSEVERANCE, THE IMPOSSIBLE , BECOMES THE NORM!    Flucanozole  For 2 days prescribed

## 2018-06-26 ENCOUNTER — Encounter: Payer: Self-pay | Admitting: Family Medicine

## 2018-06-26 MED ORDER — FENOFIBRATE 145 MG PO TABS: 145.0000 mg | ORAL_TABLET | Freq: Every day | ORAL | 3 refills | Status: DC

## 2018-06-26 NOTE — Assessment & Plan Note (Signed)
Improved, she is applauded on this. Patient re-educated about  the importance of commitment to a  minimum of 150 minutes of exercise per week.  The importance of healthy food choices with portion control discussed. Encouraged to start a food diary, count calories and to consider  joining a support group. Sample diet sheets offered. Goals set by the patient for the next several months.   Weight /BMI 06/23/2018 01/13/2018 10/11/2017  WEIGHT 203 lb 216 lb 226 lb  HEIGHT 5\' 4"  5\' 4"  5\' 4"   BMI 34.84 kg/m2 37.08 kg/m2 38.79 kg/m2  Some encounter information is confidential and restricted. Go to Review Flowsheets activity to see all data.

## 2018-06-26 NOTE — Assessment & Plan Note (Signed)
Hyperlipidemia:Low fat diet discussed and encouraged.   Lipid Panel  Lab Results  Component Value Date   CHOL 188 04/19/2018   HDL 46 04/19/2018   LDLCALC 114 (H) 04/19/2018   LDLDIRECT 138 (H) 09/15/2012   TRIG 141 04/19/2018   CHOLHDL 4.1 04/19/2018   Updated lab needed at/ before next visit. \

## 2018-06-26 NOTE — Assessment & Plan Note (Signed)
Treated at [facility] emergency room 1 week prior for UTI still mildly symptomatic , however  No fever, chills or flank pain,UA in office is normal

## 2018-06-26 NOTE — Assessment & Plan Note (Signed)
Recently treated for UTI, still mildly symptomatic, CCUA in the office is normal. She is reassured, she will contact me if her symptoms worsen or persist for referral to urology, has h/o interstitial cystitis, holding off on cystoscopy at this time , which is very reasonable in light of normal UA

## 2018-06-26 NOTE — Assessment & Plan Note (Signed)
Controlled, no change in medication  

## 2018-06-26 NOTE — Assessment & Plan Note (Signed)
Uncontrolled.Toradol and depo medrol administered IM in the office , to be followed by a short course of oral prednisone   

## 2018-06-29 ENCOUNTER — Telehealth: Payer: Self-pay

## 2018-06-29 ENCOUNTER — Ambulatory Visit (HOSPITAL_COMMUNITY)
Admission: RE | Admit: 2018-06-29 | Discharge: 2018-06-29 | Disposition: A | Discharge status: Home / Self Care | Payer: Medicare Other | Source: Ambulatory Visit | Attending: Family Medicine | Admitting: Family Medicine

## 2018-06-29 ENCOUNTER — Encounter: Payer: Self-pay | Admitting: Family Medicine

## 2018-06-29 DIAGNOSIS — E038 Other specified hypothyroidism: Secondary | ICD-10-CM

## 2018-06-29 DIAGNOSIS — E785 Hyperlipidemia, unspecified: Secondary | ICD-10-CM

## 2018-06-29 DIAGNOSIS — E039 Hypothyroidism, unspecified: Secondary | ICD-10-CM

## 2018-06-29 DIAGNOSIS — R221 Localized swelling, mass and lump, neck: Secondary | ICD-10-CM | POA: Diagnosis not present

## 2018-06-29 DIAGNOSIS — I10 Essential (primary) hypertension: Secondary | ICD-10-CM

## 2018-06-29 DIAGNOSIS — E559 Vitamin D deficiency, unspecified: Secondary | ICD-10-CM

## 2018-06-29 DIAGNOSIS — E049 Nontoxic goiter, unspecified: Secondary | ICD-10-CM

## 2018-06-29 DIAGNOSIS — R790 Abnormal level of blood mineral: Secondary | ICD-10-CM

## 2018-06-29 DIAGNOSIS — R5383 Other fatigue: Secondary | ICD-10-CM

## 2018-06-29 NOTE — Telephone Encounter (Signed)
Labs entered and req's mailed to patient to be drawn at [facility]. Patient notified.

## 2018-07-04 DIAGNOSIS — I10 Essential (primary) hypertension: Secondary | ICD-10-CM | POA: Diagnosis not present

## 2018-07-04 DIAGNOSIS — E785 Hyperlipidemia, unspecified: Secondary | ICD-10-CM | POA: Diagnosis not present

## 2018-07-04 DIAGNOSIS — R790 Abnormal level of blood mineral: Secondary | ICD-10-CM | POA: Diagnosis not present

## 2018-07-04 DIAGNOSIS — R5383 Other fatigue: Secondary | ICD-10-CM | POA: Diagnosis not present

## 2018-07-06 ENCOUNTER — Encounter: Payer: Self-pay | Admitting: Family Medicine

## 2018-07-06 LAB — HEMOGLOBIN A1C
ESTIMATED AVERAGE GLUCOSE: 105 mg/dL
HEMOGLOBIN A1C: 5.3 % (ref 4.8–5.6)

## 2018-07-06 LAB — IRON: IRON: 101 ug/dL (ref 27–159)

## 2018-07-06 LAB — FERRITIN: Ferritin: 210 ng/mL — ABNORMAL HIGH (ref 15–150)

## 2018-07-06 LAB — CBC
HEMOGLOBIN: 13.3 g/dL (ref 11.1–15.9)
Hematocrit: 40.3 % (ref 34.0–46.6)
MCH: 30.3 pg (ref 26.6–33.0)
MCHC: 33 g/dL (ref 31.5–35.7)
MCV: 92 fL (ref 79–97)
Platelets: 200 10*3/uL (ref 150–450)
RBC: 4.39 x10E6/uL (ref 3.77–5.28)
RDW: 14.1 % (ref 12.3–15.4)
WBC: 6.5 10*3/uL (ref 3.4–10.8)

## 2018-07-06 LAB — LIPID PANEL
CHOL/HDL RATIO: 4.5 ratio — AB (ref 0.0–4.4)
Cholesterol, Total: 214 mg/dL — ABNORMAL HIGH (ref 100–199)
HDL: 48 mg/dL (ref >39)
LDL Calculated: 123 mg/dL — ABNORMAL HIGH (ref 0–99)
Triglycerides: 214 mg/dL — ABNORMAL HIGH (ref 0–149)
VLDL Cholesterol Cal: 43 mg/dL — ABNORMAL HIGH (ref 5–40)

## 2018-07-06 LAB — VITAMIN D 25 HYDROXY (VIT D DEFICIENCY, FRACTURES): VIT D 25 HYDROXY: 17.8 ng/mL — AB (ref 30.0–100.0)

## 2018-07-06 LAB — TSH: TSH: 2.38 u[IU]/mL (ref 0.450–4.500)

## 2018-07-07 ENCOUNTER — Encounter: Payer: Self-pay | Admitting: Family Medicine

## 2018-07-12 ENCOUNTER — Telehealth: Payer: Self-pay | Admitting: Family Medicine

## 2018-07-12 NOTE — Telephone Encounter (Signed)
Please refer pt to  urology for burning with urination  And  frequency , diagnosis is interstital cystisis, to the Provider or group she wants to go to,that is fine , she has been managed for this in the past,call her and arange this please

## 2018-07-12 NOTE — Telephone Encounter (Signed)
Forwarding to Dr.Simpson

## 2018-07-12 NOTE — Telephone Encounter (Signed)
Patient states her UTI is no better & she is requesting a urology referral. Cb# [phone]/ [phone]

## 2018-07-13 ENCOUNTER — Other Ambulatory Visit: Payer: Self-pay | Admitting: Family Medicine

## 2018-07-15 DIAGNOSIS — Z95828 Presence of other vascular implants and grafts: Secondary | ICD-10-CM | POA: Diagnosis not present

## 2018-07-17 ENCOUNTER — Encounter: Payer: Self-pay | Admitting: Family Medicine

## 2018-07-20 ENCOUNTER — Other Ambulatory Visit: Payer: Self-pay | Admitting: Family Medicine

## 2018-07-20 DIAGNOSIS — R221 Localized swelling, mass and lump, neck: Secondary | ICD-10-CM

## 2018-07-20 DIAGNOSIS — D171 Benign lipomatous neoplasm of skin and subcutaneous tissue of trunk: Secondary | ICD-10-CM

## 2018-07-20 DIAGNOSIS — R490 Dysphonia: Secondary | ICD-10-CM

## 2018-07-20 NOTE — Progress Notes (Signed)
amb endo  

## 2018-07-20 NOTE — Progress Notes (Signed)
amb  

## 2018-07-29 DIAGNOSIS — M48061 Spinal stenosis, lumbar region without neurogenic claudication: Secondary | ICD-10-CM | POA: Diagnosis not present

## 2018-07-29 DIAGNOSIS — G894 Chronic pain syndrome: Secondary | ICD-10-CM | POA: Diagnosis not present

## 2018-07-29 DIAGNOSIS — Z5181 Encounter for therapeutic drug level monitoring: Secondary | ICD-10-CM | POA: Diagnosis not present

## 2018-07-29 DIAGNOSIS — Z79891 Long term (current) use of opiate analgesic: Secondary | ICD-10-CM | POA: Diagnosis not present

## 2018-08-11 ENCOUNTER — Other Ambulatory Visit: Payer: Self-pay | Admitting: Family Medicine

## 2018-08-12 ENCOUNTER — Ambulatory Visit: Payer: Self-pay | Admitting: Surgery

## 2018-08-12 DIAGNOSIS — D179 Benign lipomatous neoplasm, unspecified: Secondary | ICD-10-CM | POA: Diagnosis not present

## 2018-08-12 NOTE — H&P (Signed)
Surgical H&P  CC: subcutaneous mass upper back  HPI: This is a pleasant and very unfortunate 57 year old woman who is referred here for a subcutaneous mass in her right upper back.  This has been present for quite some time, she experiences burning pain in the area, exacerbated by lifting her grandson are using a muscle anyway.  Has not noted a change in size of the lesion since she first noticed it.  Desires excision to alleviate the pain.  Of note she has a very complicated surgical history including a total abdominal colectomy for colonic inertia and subsequently a gynecological procedure for diagnosis of ovarian cancer which turned out to be a misdiagnosis, apparently this procedure went awry with multiple enterotomies and ultimately her undergoing a total hysterectomy, she was subsequently septic from abdominal sepsis and had to have multiple subsequent abdominal surgeries and enterocutaneous fistulae which took several years to heal, she was followed by Dr. Dossie Der at [facility].  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  She is also status post left mastectomy for cancer.  She has chronic pain and takes Dilaudid twice a day.  Allergies  Allergen Reactions  . Nitrofurantoin Hives  . Bisacodyl Other (See Comments)    Makes patient feel like she is having cramps Other reaction(s): Other (See Comments) Makes patient feel like she is having cramps Makes patient feel like she is having cramps  . Clarithromycin Hives and Other (See Comments)    Other reaction(s): Other (See Comments) Cluster migraines Cluster migraines  . Clarithromycin Other (See Comments)    Cluster migraines   . Clindamycin Hcl Hives  . Codeine Itching  . Monosodium Glutamate Other (See Comments)    Cluster  migraines Other reaction(s): Other (See Comments) Cluster migraines Cluster migraines  . Scopolamine Hbr Other (See Comments)    Cluster migraines, impaired vision    Past Medical History:  Diagnosis Date  . Acute renal failure ([place])    renal faliure with surgery   . Anemia   . Anxiety   . Bilateral ovarian cysts   . Blood transfusion without reported diagnosis   . Cancer ([place])    left breast   . Carpal tunnel syndrome   . Clotting disorder ([place])   . Depression   . DVT (deep venous thrombosis) ([place])   . DVT (deep venous thrombosis) ([place])   . E coli infection   . Fibromyalgia   . GERD (gastroesophageal reflux disease)   . H/O bilateral salpingo-oophorectomy 02/28/2015  . Hx of migraines   . Hypercholesterolemia   . Interstitial cystitis   . Irregular heartbeat   . Medically noncompliant 02/28/2015  . PE (pulmonary embolism)   . Perforation bowel ([place])   . Pneumonia   . Sepsis(995.91)   . Stroke ([place])   . SVT (supraventricular tachycardia) ([place])   . Thyroid disease     Past Surgical History:  Procedure Laterality Date  . ABDOMINAL HYSTERECTOMY    . APPENDECTOMY    . AUGMENTATION MAMMAPLASTY Right    2006  . bowel fistula    . BOWEL RESECTION    . BREAST ENHANCEMENT SURGERY  1983  . BREAST SURGERY    . CARPAL TUNNEL RELEASE    . CERVICAL SPINE SURGERY    .  CESAREAN SECTION  1986  . COLON SURGERY    . COSMETIC SURGERY    . ENTEROCUTANEOUS FISTULA CLOSURE N/A 08/09/2013   [facility], Dr Dossie Der  . FRACTURE SURGERY     arm right (as a child)   . heart ablation    . HERNIA REPAIR    . MASTECTOMY Left    with tram 2006  . removal of breast implants  1993  . VENA CAVA FILTER PLACEMENT      Family History  Problem Relation Age of Onset  . Diabetes Mother   . Heart disease Mother   . Hypertension Mother   . Heart disease Father   . Hyperlipidemia Father   . Hypertension Father   . Alcohol abuse Father   . Colon cancer Maternal Aunt   . Breast cancer Maternal  Aunt   . Cancer Maternal Uncle        mets  . Bone cancer Maternal Grandfather        mets  . Ovarian cancer Cousin 65  . Breast cancer Cousin   . Prostate cancer Maternal Uncle     Social History   Socioeconomic History  . Marital status: Married    Spouse name: Not on file  . Number of children: Not on file  . Years of education: Not on file  . Highest education level: Not on file  Occupational History  . Not on file  Social Needs  . Financial resource strain: Not on file  . Food insecurity:    Worry: Not on file    Inability: Not on file  . Transportation needs:    Medical: Not on file    Non-medical: Not on file  Tobacco Use  . Smoking status: Never Smoker  . Smokeless tobacco: Never Used  Substance and Sexual Activity  . Alcohol use: No    Comment: rare  . Drug use: No  . Sexual activity: Yes    Birth control/protection: Surgical  Lifestyle  . Physical activity:    Days per week: Not on file    Minutes per session: Not on file  . Stress: Not on file  Relationships  . Social connections:    Talks on phone: Not on file    Gets together: Not on file    Attends religious service: Not on file    Active member of club or organization: Not on file    Attends meetings of clubs or organizations: Not on file    Relationship status: Not on file  Other Topics Concern  . Not on file  Social History Narrative  . Not on file    Current Outpatient Medications on File Prior to Visit  Medication Sig Dispense Refill  . ALPRAZolam (XANAX) 1 MG tablet TAKE 1 TABLET BY MOUTH TWICE DAILY 60 tablet 0  . Botulinum Toxin Type A (BOTOX) 200 units SOLR     . conjugated estrogens (PREMARIN) vaginal cream Place 1 Applicatorful at bedtime vaginally. 42.5 g 1  . cyclobenzaprine (FLEXERIL) 10 MG tablet TAKE 1 TABLET BY MOUTH TWICE DAILY AS NEEDED FOR MUSCLE SPASMS GENERIC EQUIVALENT FOR FLEXERIL 180 tablet 0  . DULoxetine (CYMBALTA) 30 MG capsule Take 1 capsule (30 mg total) by  mouth 2 (two) times daily. 180 capsule 1  . fenofibrate (TRICOR) 145 MG tablet Take 1 tablet (145 mg total) by mouth daily. 90 tablet 3  . fluconazole (DIFLUCAN) 150 MG tablet One tablet once daily as needed for vaginal itch 2 tablet 0  .  fluticasone (FLONASE) 50 MCG/ACT nasal spray Place 2 sprays into both nostrils daily. 48 g 1  . Glycerin, Laxative, 80.7 % SUPP Place 1 suppository rectally at bedtime as needed (for constipation).     Marland Kitchen HYDROmorphone (DILAUDID) 4 MG tablet One tablet twice daily 20 tablet 0  . levothyroxine (SYNTHROID, LEVOTHROID) 125 MCG tablet TAKE 1 TABLET BY MOUTH DAILY BEFORE BREAKFAST 90 tablet 0  . Multiple Vitamins-Minerals (HAIR/SKIN/NAILS PO) Take 1 tablet by mouth daily.    Marland Kitchen NEXIUM 40 MG capsule Take 1 capsule (40 mg total) by mouth 2 (two) times daily before a meal. 180 capsule 1  . ondansetron (ZOFRAN) 4 MG tablet Take 4 mg by mouth every 8 (eight) hours as needed for nausea or vomiting.    . potassium chloride (K-DUR) 10 MEQ tablet Take 1 tablet (10 mEq total) by mouth daily. 30 tablet 2  . senna-docusate (SENOKOT-S) 8.6-50 MG per tablet Take 2 tablets by mouth 3 (three) times daily. constipation    . topiramate (TOPAMAX) 100 MG tablet Take 1 tablet (100 mg total) by mouth daily. 60 tablet 5  . verapamil (CALAN) 120 MG tablet One tablet once daily as needed for palpitation and chest discomfort (Patient not taking: Reported on 06/23/2018) 90 tablet 1  . vitamin C (ASCORBIC ACID) 500 MG tablet Take 500 mg by mouth daily.      No current facility-administered medications on file prior to visit.     Review of Systems:  General Present- Fatigue, Night Sweats, Weight Gain and Weight Loss. Not Present- Appetite Loss, Chills and Fever. Skin Present- Dryness. Not Present- Change in Wart/Mole, Hives, Jaundice, New Lesions, Non-Healing Wounds, Rash and Ulcer. HEENT Present- Hoarseness, Ringing in the Ears, Sore Throat and Visual Disturbances. Not Present- Earache, Hearing  Loss, Nose Bleed, Oral Ulcers, Seasonal Allergies, Sinus Pain, Wears glasses/contact lenses and Yellow Eyes. Breast Present- Breast Pain. Not Present- Breast Mass, Nipple Discharge and Skin Changes. Cardiovascular Present- Palpitations. Not Present- Chest Pain, Difficulty Breathing Lying Down, Leg Cramps, Rapid Heart Rate, Shortness of Breath and Swelling of Extremities. Gastrointestinal Present- Abdominal Pain, Hemorrhoids and Rectal Pain. Not Present- Bloating, Bloody Stool, Change in Bowel Habits, Chronic diarrhea, Constipation, Difficulty Swallowing, Excessive gas, Gets full quickly at meals, Indigestion, Nausea and Vomiting. Musculoskeletal Present- Back Pain, Joint Pain, Muscle Pain and Muscle Weakness. Not Present- Joint Stiffness and Swelling of Extremities. Neurological Present- Headaches, Tingling and Weakness. Not Present- Decreased Memory, Fainting, Numbness, Seizures, Tremor and Trouble walking. Endocrine Present- Hair Changes. Not Present- Cold Intolerance, Excessive Hunger, Heat Intolerance, Hot flashes and New Diabetes. All other systems negative  Physical Exam: There were no vitals filed for this visit. Gen: alert and cooperative Eye: extraocular motion intact, no scleral icterus ENT: moist mucus membranes, dentition intact Neck: no mass or thyromegaly Chest: unlabored respirations, symmetrical air entry, clear bilaterally CV: regular rate and rhythm, no pedal edema.  The palmar aspect of the right hand and fingers are noticeably a little bit swollen and blue compared to the left.  She states this is chronic. MSK: strength symmetrical throughout, no deformity Neuro: grossly intact, normal gait Psych: normal mood and affect, appropriate insight Skin: warm and dry.  In the right upper back there is a subcutaneous ovoid lesion which feels somewhat deep but smooth and rubbery consistent with a lipoma which may be intramuscular.  Approximately 3-4 cm in length   CBC Latest Ref Rng  & Units 07/04/2018 10/28/2017 08/04/2017  WBC 3.4 - 10.8 x10E3/uL 6.5 6.2 6.6  Hemoglobin 11.1 - 15.9 g/dL 13.3 13.4 13.8  Hematocrit 34.0 - 46.6 % 40.3 39.5 40.1  Platelets 150 - 450 x10E3/uL 200 201 189    CMP Latest Ref Rng & Units 04/19/2018 10/28/2017 08/04/2017  Glucose 65 - 99 mg/dL 90 112(H) 133(H)  BUN 6 - 24 mg/dL 22 23(H) 24(H)  Creatinine 0.57 - 1.00 mg/dL 0.99 1.21(H) 1.03(H)  Sodium 134 - 144 mmol/L 144 138 140  Potassium 3.5 - 5.2 mmol/L 4.0 4.0 3.8  Chloride 96 - 106 mmol/L 107(H) 105 109  CO2 20 - 29 mmol/L 21 23 20(L)  Calcium 8.7 - 10.2 mg/dL 9.8 9.5 9.8  Total Protein 6.0 - 8.5 g/dL 6.7 6.8 7.1  Total Bilirubin 0.0 - 1.2 mg/dL 0.4 0.7 0.7  Alkaline Phos 39 - 117 IU/L 68 65 75  AST 0 - 40 IU/L 18 31 38  ALT 0 - 32 IU/L 16 32 48    Lab Results  Component Value Date   INR 1.10 09/02/2011   INR 1.1 08/11/2009    Imaging: No results found.    A/P: Likely a lipoma, possibly intramuscular.  Discussed risks of excision including bleeding, infection, pain, scarring, injury to muscle or adjacent nerves and chronic pain, failure to resolve current pain, recurrence of the lesion.  Questions answered to her satisfaction.  She would like to proceed with excision.   Romana Juniper, MD [facility], [place] Pager [phone]

## 2018-08-19 ENCOUNTER — Other Ambulatory Visit: Payer: Self-pay | Admitting: Family Medicine

## 2018-08-19 ENCOUNTER — Other Ambulatory Visit: Payer: Self-pay

## 2018-08-19 ENCOUNTER — Telehealth: Payer: Self-pay | Admitting: Family Medicine

## 2018-08-19 MED ORDER — ALPRAZOLAM 1 MG PO TABS: 1.0000 mg | ORAL_TABLET | Freq: Two times a day (BID) | ORAL | 3 refills | Status: DC

## 2018-08-19 MED ORDER — ROSUVASTATIN CALCIUM 5 MG PO TABS: 5.0000 mg | ORAL_TABLET | Freq: Every day | ORAL | 5 refills | Status: DC

## 2018-08-19 NOTE — Progress Notes (Signed)
crestor

## 2018-08-19 NOTE — Telephone Encounter (Signed)
Patient is calling to inquire about her cholesterol medication. She says she never received it. [place] [facility] [phone]  Also requesting xanex refill

## 2018-08-19 NOTE — Telephone Encounter (Signed)
pls let her know that crestor 5 mg daily has been sent in and she needs to STOP fenofibrate now that she is starting this due to drug interaction ( I discontinued in her record and sent a note on the script to her pharmacy

## 2018-08-19 NOTE — Telephone Encounter (Signed)
Message left for pt on voiemail

## 2018-08-22 ENCOUNTER — Other Ambulatory Visit: Payer: Self-pay | Admitting: Family Medicine

## 2018-08-22 MED ORDER — ALPRAZOLAM 1 MG PO TABS: 1.0000 mg | ORAL_TABLET | Freq: Two times a day (BID) | ORAL | 5 refills | Status: DC

## 2018-08-24 ENCOUNTER — Telehealth: Payer: Self-pay | Admitting: Family Medicine

## 2018-08-24 NOTE — Telephone Encounter (Signed)
Patient wants a xray ordered on her abdominal area.  Having pain and wants xray and than decide to go to [facility] if needed.

## 2018-08-24 NOTE — Telephone Encounter (Signed)
Abdominal Xray MAY show if getting obstructed and will miss some things   ABDOMINAL PAIN waking her up and persisting best evaluated   Clinically, office, urgent care or ED depending how severe SHARP abdominal pain best with clinical evaluation especially if persisting, so if passing stool and flatus and the pain is not severe now, little to gain from an X ray now,   May offer am appt if she wants that or advise more urgent clinical eval

## 2018-08-24 NOTE — Telephone Encounter (Signed)
States she woke up this am at 3 something with a stabbing pain in her mid abdomen right above her belly button. It lasted about 10 mins then eased up but can still feel a twinge occasionally throughout the day. Wanted to see if you would order an xray before she made an appt at [facility] to see if it was really needed

## 2018-08-25 ENCOUNTER — Ambulatory Visit (INDEPENDENT_AMBULATORY_CARE_PROVIDER_SITE_OTHER): Payer: Medicare Other | Admitting: Family Medicine

## 2018-08-25 ENCOUNTER — Encounter: Payer: Self-pay | Admitting: Family Medicine

## 2018-08-25 ENCOUNTER — Other Ambulatory Visit: Payer: Self-pay

## 2018-08-25 VITALS — BP 108/68 | HR 100 | Resp 12 | Ht 64.0 in | Wt 206.0 lb

## 2018-08-25 DIAGNOSIS — F411 Generalized anxiety disorder: Secondary | ICD-10-CM

## 2018-08-25 DIAGNOSIS — C50012 Malignant neoplasm of nipple and areola, left female breast: Secondary | ICD-10-CM | POA: Diagnosis not present

## 2018-08-25 DIAGNOSIS — M549 Dorsalgia, unspecified: Secondary | ICD-10-CM | POA: Diagnosis not present

## 2018-08-25 DIAGNOSIS — Z23 Encounter for immunization: Secondary | ICD-10-CM | POA: Diagnosis not present

## 2018-08-25 DIAGNOSIS — Z803 Family history of malignant neoplasm of breast: Secondary | ICD-10-CM

## 2018-08-25 DIAGNOSIS — Z95828 Presence of other vascular implants and grafts: Secondary | ICD-10-CM

## 2018-08-25 DIAGNOSIS — E039 Hypothyroidism, unspecified: Secondary | ICD-10-CM | POA: Diagnosis not present

## 2018-08-25 DIAGNOSIS — R232 Flushing: Secondary | ICD-10-CM

## 2018-08-25 DIAGNOSIS — L659 Nonscarring hair loss, unspecified: Secondary | ICD-10-CM

## 2018-08-25 DIAGNOSIS — R5382 Chronic fatigue, unspecified: Secondary | ICD-10-CM

## 2018-08-25 DIAGNOSIS — R790 Abnormal level of blood mineral: Secondary | ICD-10-CM

## 2018-08-25 DIAGNOSIS — D509 Iron deficiency anemia, unspecified: Secondary | ICD-10-CM

## 2018-08-25 DIAGNOSIS — K219 Gastro-esophageal reflux disease without esophagitis: Secondary | ICD-10-CM

## 2018-08-25 MED ORDER — METHYLPREDNISOLONE ACETATE 80 MG/ML IJ SUSP
80.0000 mg | Freq: Once | INTRAMUSCULAR | Status: AC
  Administered 2018-08-25: 80 mg via INTRAMUSCULAR

## 2018-08-25 MED ORDER — DULOXETINE HCL 30 MG PO CPEP: 30.0000 mg | ORAL_CAPSULE | Freq: Two times a day (BID) | ORAL | 1 refills | Status: DC

## 2018-08-25 MED ORDER — ROSUVASTATIN CALCIUM 5 MG PO TABS: 5.0000 mg | ORAL_TABLET | Freq: Every day | ORAL | 5 refills | Status: DC

## 2018-08-25 MED ORDER — NEXIUM 40 MG PO CPDR: 40.0000 mg | DELAYED_RELEASE_CAPSULE | Freq: Two times a day (BID) | ORAL | 1 refills | Status: DC

## 2018-08-25 MED ORDER — ESTROGENS, CONJUGATED 0.625 MG/GM VA CREA: 1.0000 | TOPICAL_CREAM | Freq: Every day | VAGINAL | 1 refills | Status: DC

## 2018-08-25 MED ORDER — POTASSIUM CHLORIDE ER 10 MEQ PO TBCR: 10.0000 meq | EXTENDED_RELEASE_TABLET | Freq: Every day | ORAL | 2 refills | Status: DC

## 2018-08-25 MED ORDER — KETOROLAC TROMETHAMINE 60 MG/2ML IM SOLN
60.0000 mg | Freq: Once | INTRAMUSCULAR | Status: AC
  Administered 2018-08-25: 60 mg via INTRAMUSCULAR

## 2018-08-25 MED ORDER — CYCLOBENZAPRINE HCL 10 MG PO TABS: ORAL_TABLET | ORAL | 0 refills | Status: DC

## 2018-08-25 MED ORDER — TOPIRAMATE 100 MG PO TABS: 100.0000 mg | ORAL_TABLET | Freq: Every day | ORAL | 5 refills | Status: DC

## 2018-08-25 MED ORDER — ALPRAZOLAM 1 MG PO TABS: 1.0000 mg | ORAL_TABLET | Freq: Two times a day (BID) | ORAL | 5 refills | Status: AC

## 2018-08-25 NOTE — Progress Notes (Signed)
[name]     MRN: [id number]      DOB: 1961/02/02   HPI [name] is here stating that generalized pain, itching all over, no rash, episodes of flushing, hair  loss, fatigue, does not snore reportedly  Awaiting removal  Of IVC filter which she has been told is causing  Pain, her local Doc will not remove and she is hoping to find a vascular surgeon who will, a surgeon in [place] is awaiting scan from local [name] scan done in March Concerned about cancer.Has personal h/o Paget's disease of left breast, now concerned about possible right breast disease although she has no specific skin lesion on the breast , she c/o increased right chest discomfort and had requested a PET scan to see if she has cancer anywhere as she is at high risk for cancer based on her personal and family history. I defer and advise that consultation with a cancer Specialist is a more appropriate / beneficial route and she agrees c/o uncontrolled and disabling pain as she awaits surgery to remove the IVC filter in lower back , injections offered for acute pain and she agrees, she is already being treated at [name]  ROS Denies recent fever or chills. Denies sinus pressure, nasal congestion, ear pain  Denies chest congestion, productive cough or wheezing. Denies chest pains, palpitations and leg swelling Denies abdominal pain, nausea, vomiting,diarrhea or constipation.   Denies dysuria, frequency, hesitancy or incontinence. Denies headaches, seizures, numbness, or tingling.  Denies skin break down or rash.   PE  BP 108/68 (BP Location: Right Arm, Patient Position: Sitting, Cuff Size: Large)   Pulse 100   Resp 12   Ht 5\' 4"  (1.626 m)   Wt 206 lb 0.6 oz (93.5 kg)   SpO2 96% Comment: room air  BMI 35.37 kg/m   Patient alert and oriented and in no cardiopulmonary distress.  HEENT: No facial asymmetry, EOMI,   oropharynx  moist.  Neck supple no JVD, no mass.  Chest: Clear to auscultation  bilaterally.  CVS: S1, S2 no murmurs, no S3.Regular rate.    Ext: No edema  HE:NIDPOEUMP ROM lumbar spine, adequate in shoulders, hips and knees.  Skin: Intact, no ulcerations or rash noted.  Psych: Good eye contact, normal affect. Memory intact anxious not  depressed appearing.  CNS: CN 2-12 intact, power,  normal throughout.no focal deficits noted.   Assessment & Plan  Back pain with radiation Uncontrolled.Toradol and depo medrol administered IM in the office , .chronic pain management as before through pain clinic at [name]   Paget's disease of breast, left ([name]) New concern in patient regarding recurrent breast cancer in right breast , and also of cancer in general, as she has 15 family members ,with cancer Refer to Oncology in [name] for further evaluation, pt's request of a PET scan is being deferred to oncology as well as any specific tests that would be needed to address concerns   S/P insertion of IVC (inferior vena caval) filter Increasingly causing debilitating pain due to dislodgement , pt awaiting appropriate surgeon who will  operate to correct this. She a is n extremely high risk surgical candidate, and is aware Today based on her increasing level of pain which she attributes to this issue , she is personally seeking out a Psychologist, sport and exercise who o is willing to operate, states currently awaiting word from  Doctor in [name], Psychologist, sport and exercise at [name] states he will not operate on her reportedly  Hypothyroidism Pt on  replacement treatment, however especially in the past 3 weeks states she is experiencing increased fatigue, hair loss and flushing, se also c/o hoarseness and sore throat. Hs had ENT evaluation in the past and did not think it beneficial, she does take PPI two times daily for reflux Recent thyroid US was negative for nodules. Endocinology evaluation is appropriate as she ihas multiple symptoms related to thyroid disease and is very concerned that this is an issue. After sge sees  endo , I will encourage GI eval for her reflux for which she is on twice daily protonix   Anxiety state Patient extremely anxious as she reports that in the past 3 weeks her health has taken a downward dive , multipls sonmatic complaints, topping the last are stated concern for cancer not yet diagnosed, an uncontrolled pain with a wait period / hold on surgery which may be helpful Will attempt to get medical consultations in place asap  Fatigue 3 week h/o increase fatigue and malaise, however she reports poo sleep due to uncontrolled pain, endo referral to evaluate the complaint along with hair loss , and flushing being treated for hypothyroidism  GERD (gastroesophageal reflux disease) High dose PPI with worsening hoarseness, GI re eval likely needed along with ENT, willfu on this after endo eval

## 2018-08-25 NOTE — Patient Instructions (Addendum)
You are being referred to endocrinology, oncology, and ENT  Contact the office next Wednesday for appointment  Info   Flu vaccine  today  Keep October apppointment   Toradol and depomedrol  iM in office today for back pain  .

## 2018-08-25 NOTE — Assessment & Plan Note (Addendum)
Uncontrolled.Toradol and depo medrol administered IM in the office , .chronic pain management as before through pain clinic at [facility]

## 2018-08-25 NOTE — Telephone Encounter (Signed)
Pt requested appt for today

## 2018-08-31 ENCOUNTER — Encounter: Payer: Self-pay | Admitting: Family Medicine

## 2018-08-31 NOTE — Assessment & Plan Note (Signed)
High dose PPI with worsening hoarseness, GI re eval likely needed along with ENT, willfu on this after endo eval

## 2018-08-31 NOTE — Assessment & Plan Note (Signed)
Patient extremely anxious as she reports that in the past 3 weeks her health has taken a downward dive , multipls sonmatic complaints, topping the last are stated concern for cancer not yet diagnosed, an uncontrolled pain with a wait period / hold on surgery which may be helpful Will attempt to get medical consultations in place asap

## 2018-08-31 NOTE — Assessment & Plan Note (Signed)
New concern in patient regarding recurrent breast cancer in right breast , and also of cancer in general, as she has 15 family members ,with cancer Refer to Oncology in [place] for further evaluation, pt's request of a PET scan is being deferred to oncology as well as any specific tests that would be needed to address concerns

## 2018-08-31 NOTE — Assessment & Plan Note (Signed)
Increasingly causing debilitating pain due to dislodgement , pt awaiting appropriate surgeon who will  operate to correct this. She a is n extremely high risk surgical candidate, and is aware Today based on her increasing level of pain which she attributes to this issue , she is personally seeking out a surgeon who o is willing to operate, states currently awaiting word from  Doctor in [place], Psychologist, sport and exercise at [facility] states he will not operate on her reportedly

## 2018-08-31 NOTE — Assessment & Plan Note (Signed)
Pt on replacement treatment, however especially in the past 3 weeks states she is experiencing increased fatigue, hair loss and flushing, se also c/o hoarseness and sore throat. Hs had ENT evaluation in the past and did not think it beneficial, she does take PPI two times daily for reflux Recent thyroid US was negative for nodules. Endocinology evaluation is appropriate as she ihas multiple symptoms related to thyroid disease and is very concerned that this is an issue. After sge sees endo , I will encourage GI eval for her reflux for which she is on twice daily protonix

## 2018-08-31 NOTE — Assessment & Plan Note (Signed)
3 week h/o increase fatigue and malaise, however she reports poo sleep due to uncontrolled pain, endo referral to evaluate the complaint along with hair loss , and flushing being treated for hypothyroidism

## 2018-09-01 ENCOUNTER — Other Ambulatory Visit: Payer: Self-pay | Admitting: Family Medicine

## 2018-09-01 DIAGNOSIS — M5440 Lumbago with sciatica, unspecified side: Secondary | ICD-10-CM

## 2018-09-01 DIAGNOSIS — G8929 Other chronic pain: Secondary | ICD-10-CM

## 2018-09-01 DIAGNOSIS — R499 Unspecified voice and resonance disorder: Secondary | ICD-10-CM

## 2018-09-01 NOTE — Progress Notes (Signed)
Pt to be referred tgo ENT and local pain management

## 2018-09-09 ENCOUNTER — Telehealth: Payer: Self-pay | Admitting: Family Medicine

## 2018-09-09 NOTE — Telephone Encounter (Signed)
Left detailed voicemail for patient to find out more information about her endo referral.

## 2018-09-13 ENCOUNTER — Encounter (HOSPITAL_BASED_OUTPATIENT_CLINIC_OR_DEPARTMENT_OTHER): Payer: Self-pay | Admitting: *Deleted

## 2018-09-13 ENCOUNTER — Other Ambulatory Visit: Payer: Self-pay

## 2018-09-13 NOTE — Progress Notes (Signed)
Spoke w/ pt via phone for pre-op interview.  Npo after mn.  Arrive at [employer]  Needs hg and ekg.  Will take synthroid, dilaudid, cymbalta, senokot, xanax, topamax, flexeril, and nexium am dos w/ sips of water.  Pt asked to bring metoprolol in original prescription bottle, since she takes as needed.

## 2018-09-14 ENCOUNTER — Encounter: Payer: Self-pay | Admitting: Family Medicine

## 2018-09-16 ENCOUNTER — Ambulatory Visit (HOSPITAL_BASED_OUTPATIENT_CLINIC_OR_DEPARTMENT_OTHER): Payer: Medicare Other | Admitting: Anesthesiology

## 2018-09-16 ENCOUNTER — Ambulatory Visit (HOSPITAL_BASED_OUTPATIENT_CLINIC_OR_DEPARTMENT_OTHER)
Admission: RE | Admit: 2018-09-16 | Discharge: 2018-09-16 | Disposition: A | Discharge status: Home / Self Care | Payer: Medicare Other | Source: Ambulatory Visit | Attending: Surgery | Admitting: Surgery

## 2018-09-16 ENCOUNTER — Encounter (HOSPITAL_BASED_OUTPATIENT_CLINIC_OR_DEPARTMENT_OTHER): Payer: Self-pay | Admitting: *Deleted

## 2018-09-16 ENCOUNTER — Encounter (HOSPITAL_BASED_OUTPATIENT_CLINIC_OR_DEPARTMENT_OTHER)
Admission: RE | Disposition: A | Discharge status: Home / Self Care | Payer: Self-pay | Source: Ambulatory Visit | Attending: Surgery

## 2018-09-16 DIAGNOSIS — Z9049 Acquired absence of other specified parts of digestive tract: Secondary | ICD-10-CM | POA: Insufficient documentation

## 2018-09-16 DIAGNOSIS — I10 Essential (primary) hypertension: Secondary | ICD-10-CM | POA: Diagnosis not present

## 2018-09-16 DIAGNOSIS — Z86711 Personal history of pulmonary embolism: Secondary | ICD-10-CM | POA: Diagnosis not present

## 2018-09-16 DIAGNOSIS — Z79890 Hormone replacement therapy (postmenopausal): Secondary | ICD-10-CM | POA: Diagnosis not present

## 2018-09-16 DIAGNOSIS — Z8673 Personal history of transient ischemic attack (TIA), and cerebral infarction without residual deficits: Secondary | ICD-10-CM | POA: Insufficient documentation

## 2018-09-16 DIAGNOSIS — Z79899 Other long term (current) drug therapy: Secondary | ICD-10-CM | POA: Insufficient documentation

## 2018-09-16 DIAGNOSIS — E079 Disorder of thyroid, unspecified: Secondary | ICD-10-CM | POA: Diagnosis not present

## 2018-09-16 DIAGNOSIS — D171 Benign lipomatous neoplasm of skin and subcutaneous tissue of trunk: Secondary | ICD-10-CM | POA: Diagnosis not present

## 2018-09-16 DIAGNOSIS — M797 Fibromyalgia: Secondary | ICD-10-CM | POA: Diagnosis not present

## 2018-09-16 DIAGNOSIS — I4891 Unspecified atrial fibrillation: Secondary | ICD-10-CM | POA: Insufficient documentation

## 2018-09-16 DIAGNOSIS — Z885 Allergy status to narcotic agent status: Secondary | ICD-10-CM | POA: Diagnosis not present

## 2018-09-16 DIAGNOSIS — Z9012 Acquired absence of left breast and nipple: Secondary | ICD-10-CM | POA: Insufficient documentation

## 2018-09-16 DIAGNOSIS — Z86718 Personal history of other venous thrombosis and embolism: Secondary | ICD-10-CM | POA: Insufficient documentation

## 2018-09-16 DIAGNOSIS — E039 Hypothyroidism, unspecified: Secondary | ICD-10-CM | POA: Insufficient documentation

## 2018-09-16 DIAGNOSIS — Z881 Allergy status to other antibiotic agents status: Secondary | ICD-10-CM | POA: Insufficient documentation

## 2018-09-16 HISTORY — DX: Other constipation: K59.09

## 2018-09-16 HISTORY — DX: Presence of spectacles and contact lenses: Z97.3

## 2018-09-16 HISTORY — DX: Hyperlipidemia, unspecified: E78.5

## 2018-09-16 HISTORY — DX: Migraine, unspecified, not intractable, without status migrainosus: G43.909

## 2018-09-16 HISTORY — DX: Personal history of Methicillin resistant Staphylococcus aureus infection: Z86.14

## 2018-09-16 HISTORY — DX: Benign lipomatous neoplasm of skin and subcutaneous tissue of trunk: D17.1

## 2018-09-16 HISTORY — DX: Family history of other specified conditions: Z84.89

## 2018-09-16 HISTORY — PX: LIPOMA EXCISION: SHX5283

## 2018-09-16 HISTORY — DX: Supraventricular tachycardia, unspecified: I47.10

## 2018-09-16 HISTORY — DX: Nausea with vomiting, unspecified: R11.2

## 2018-09-16 HISTORY — DX: Incisional hernia without obstruction or gangrene: K43.2

## 2018-09-16 HISTORY — DX: Malignant neoplasm of nipple and areola, left female breast: C50.012

## 2018-09-16 HISTORY — DX: Iron deficiency anemia, unspecified: D50.9

## 2018-09-16 HISTORY — DX: Personal history of transient ischemic attack (TIA), and cerebral infarction without residual deficits: Z86.73

## 2018-09-16 HISTORY — DX: Other specified postprocedural states: Z98.890

## 2018-09-16 HISTORY — DX: Personal history of other diseases of the circulatory system: Z86.79

## 2018-09-16 HISTORY — DX: Presence of other vascular implants and grafts: Z95.828

## 2018-09-16 HISTORY — DX: Chronic pain syndrome: G89.4

## 2018-09-16 HISTORY — DX: Personal history of pulmonary embolism: Z86.711

## 2018-09-16 HISTORY — DX: Hypothyroidism, unspecified: E03.9

## 2018-09-16 HISTORY — DX: Personal history of other diseases of the female genital tract: Z87.42

## 2018-09-16 HISTORY — DX: Supraventricular tachycardia: I47.1

## 2018-09-16 LAB — POCT HEMOGLOBIN-HEMACUE: Hemoglobin: 12.3 g/dL (ref 12.0–15.0)

## 2018-09-16 SURGERY — EXCISION LIPOMA
Anesthesia: General | Site: Back | Laterality: Right

## 2018-09-16 MED ORDER — KETOROLAC TROMETHAMINE 30 MG/ML IJ SOLN
INTRAMUSCULAR | Status: AC
  Filled 2018-09-16: qty 1

## 2018-09-16 MED ORDER — KETOROLAC TROMETHAMINE 30 MG/ML IJ SOLN
INTRAMUSCULAR | Status: DC | PRN
  Administered 2018-09-16: 30 mg via INTRAVENOUS

## 2018-09-16 MED ORDER — FENTANYL CITRATE (PF) 100 MCG/2ML IJ SOLN
INTRAMUSCULAR | Status: DC | PRN
  Administered 2018-09-16: 100 ug via INTRAVENOUS

## 2018-09-16 MED ORDER — SODIUM CHLORIDE 0.9% FLUSH
3.0000 mL | Freq: Two times a day (BID) | INTRAVENOUS | Status: DC
  Filled 2018-09-16: qty 3

## 2018-09-16 MED ORDER — DOCUSATE SODIUM 100 MG PO CAPS: 100.0000 mg | ORAL_CAPSULE | Freq: Two times a day (BID) | ORAL | 0 refills | Status: AC

## 2018-09-16 MED ORDER — OXYCODONE HCL 5 MG PO TABS
5.0000 mg | ORAL_TABLET | ORAL | Status: DC | PRN
  Filled 2018-09-16: qty 2

## 2018-09-16 MED ORDER — SODIUM CHLORIDE 0.9% FLUSH
3.0000 mL | INTRAVENOUS | Status: DC | PRN
  Filled 2018-09-16: qty 3

## 2018-09-16 MED ORDER — LACTATED RINGERS IV SOLN
INTRAVENOUS | Status: DC
  Filled 2018-09-16: qty 1000

## 2018-09-16 MED ORDER — LACTATED RINGERS IV SOLN
INTRAVENOUS | Status: DC
  Administered 2018-09-16: 08:00:00 via INTRAVENOUS
  Filled 2018-09-16: qty 1000

## 2018-09-16 MED ORDER — MIDAZOLAM HCL 2 MG/2ML IJ SOLN
INTRAMUSCULAR | Status: AC
  Filled 2018-09-16: qty 2

## 2018-09-16 MED ORDER — DEXAMETHASONE SODIUM PHOSPHATE 10 MG/ML IJ SOLN
INTRAMUSCULAR | Status: AC
  Filled 2018-09-16: qty 1

## 2018-09-16 MED ORDER — BUPIVACAINE-EPINEPHRINE 0.25% -1:200000 IJ SOLN
INTRAMUSCULAR | Status: DC | PRN
  Administered 2018-09-16: 14 mL

## 2018-09-16 MED ORDER — FENTANYL CITRATE (PF) 100 MCG/2ML IJ SOLN
25.0000 ug | INTRAMUSCULAR | Status: DC | PRN
  Filled 2018-09-16: qty 1

## 2018-09-16 MED ORDER — CEFAZOLIN SODIUM-DEXTROSE 2-4 GM/100ML-% IV SOLN
INTRAVENOUS | Status: AC
  Filled 2018-09-16: qty 100

## 2018-09-16 MED ORDER — METOCLOPRAMIDE HCL 5 MG/ML IJ SOLN
10.0000 mg | Freq: Once | INTRAMUSCULAR | Status: DC | PRN
  Filled 2018-09-16: qty 2

## 2018-09-16 MED ORDER — ONDANSETRON HCL 4 MG/2ML IJ SOLN
INTRAMUSCULAR | Status: DC | PRN
  Administered 2018-09-16: 8 mg via INTRAVENOUS

## 2018-09-16 MED ORDER — ONDANSETRON HCL 4 MG/2ML IJ SOLN
INTRAMUSCULAR | Status: AC
  Filled 2018-09-16: qty 2

## 2018-09-16 MED ORDER — SODIUM CHLORIDE 0.9 % IV SOLN
250.0000 mL | INTRAVENOUS | Status: DC | PRN
  Filled 2018-09-16: qty 250

## 2018-09-16 MED ORDER — DEXAMETHASONE SODIUM PHOSPHATE 4 MG/ML IJ SOLN
INTRAMUSCULAR | Status: DC | PRN
  Administered 2018-09-16: 10 mg via INTRAVENOUS

## 2018-09-16 MED ORDER — PROPOFOL 10 MG/ML IV BOLUS
INTRAVENOUS | Status: AC
  Filled 2018-09-16: qty 20

## 2018-09-16 MED ORDER — DIPHENHYDRAMINE HCL 50 MG/ML IJ SOLN
INTRAMUSCULAR | Status: AC
  Filled 2018-09-16: qty 1

## 2018-09-16 MED ORDER — ACETAMINOPHEN 325 MG PO TABS
650.0000 mg | ORAL_TABLET | ORAL | Status: DC | PRN
  Filled 2018-09-16: qty 2

## 2018-09-16 MED ORDER — FENTANYL CITRATE (PF) 100 MCG/2ML IJ SOLN
INTRAMUSCULAR | Status: AC
  Filled 2018-09-16: qty 2

## 2018-09-16 MED ORDER — BUPIVACAINE LIPOSOME 1.3 % IJ SUSP
20.0000 mL | Freq: Once | INTRAMUSCULAR | Status: DC
  Filled 2018-09-16: qty 20

## 2018-09-16 MED ORDER — HYDROCODONE-ACETAMINOPHEN 5-325 MG PO TABS: 1.0000 | ORAL_TABLET | Freq: Four times a day (QID) | ORAL | 0 refills | Status: DC | PRN

## 2018-09-16 MED ORDER — MEPERIDINE HCL 25 MG/ML IJ SOLN
6.2500 mg | INTRAMUSCULAR | Status: DC | PRN
  Filled 2018-09-16: qty 1

## 2018-09-16 MED ORDER — CHLORHEXIDINE GLUCONATE 4 % EX LIQD
60.0000 mL | Freq: Once | CUTANEOUS | Status: DC
  Filled 2018-09-16: qty 118

## 2018-09-16 MED ORDER — PROPOFOL 10 MG/ML IV BOLUS
INTRAVENOUS | Status: DC | PRN
  Administered 2018-09-16: 200 mg via INTRAVENOUS
  Administered 2018-09-16: 60 mg via INTRAVENOUS

## 2018-09-16 MED ORDER — LIDOCAINE 2% (20 MG/ML) 5 ML SYRINGE
INTRAMUSCULAR | Status: AC
  Filled 2018-09-16: qty 5

## 2018-09-16 MED ORDER — ACETAMINOPHEN 650 MG RE SUPP
650.0000 mg | RECTAL | Status: DC | PRN
  Filled 2018-09-16: qty 1

## 2018-09-16 MED ORDER — FENTANYL CITRATE (PF) 100 MCG/2ML IJ SOLN
25.0000 ug | INTRAMUSCULAR | Status: DC | PRN
  Administered 2018-09-16 (×2): 25 ug via INTRAVENOUS
  Filled 2018-09-16: qty 1

## 2018-09-16 MED ORDER — CEFAZOLIN SODIUM-DEXTROSE 2-4 GM/100ML-% IV SOLN
2.0000 g | INTRAVENOUS | Status: AC
  Administered 2018-09-16: 2 g via INTRAVENOUS
  Filled 2018-09-16: qty 100

## 2018-09-16 MED ORDER — HYDROCODONE-ACETAMINOPHEN 5-325 MG PO TABS
ORAL_TABLET | ORAL | Status: AC
  Filled 2018-09-16: qty 1

## 2018-09-16 MED ORDER — MIDAZOLAM HCL 5 MG/5ML IJ SOLN
INTRAMUSCULAR | Status: DC | PRN
  Administered 2018-09-16: 1 mg via INTRAVENOUS
  Administered 2018-09-16: 2 mg via INTRAVENOUS
  Administered 2018-09-16: 1 mg via INTRAVENOUS

## 2018-09-16 MED ORDER — LIDOCAINE HCL (CARDIAC) PF 100 MG/5ML IV SOSY
PREFILLED_SYRINGE | INTRAVENOUS | Status: DC | PRN
  Administered 2018-09-16: 100 mg via INTRAVENOUS

## 2018-09-16 MED ORDER — HYDROCODONE-ACETAMINOPHEN 5-325 MG PO TABS
1.0000 | ORAL_TABLET | Freq: Once | ORAL | Status: AC
  Administered 2018-09-16: 1 via ORAL
  Filled 2018-09-16: qty 1

## 2018-09-16 MED ORDER — DIPHENHYDRAMINE HCL 50 MG/ML IJ SOLN
12.5000 mg | Freq: Once | INTRAMUSCULAR | Status: AC
  Administered 2018-09-16: 12.5 mg via INTRAVENOUS
  Filled 2018-09-16: qty 0.25

## 2018-09-16 SURGICAL SUPPLY — 36 items
ADH SKN CLS APL DERMABOND .7 (GAUZE/BANDAGES/DRESSINGS) ×1
BLADE SURG 15 STRL LF DISP TIS (BLADE) ×1 IMPLANT
BLADE SURG 15 STRL SS (BLADE) ×2
COVER MAYO STAND STRL (DRAPES) ×2 IMPLANT
COVER TABLE BACK 60X90 (DRAPES) ×2 IMPLANT
DERMABOND ADVANCED (GAUZE/BANDAGES/DRESSINGS) ×1
DERMABOND ADVANCED .7 DNX12 (GAUZE/BANDAGES/DRESSINGS) ×1 IMPLANT
DRAPE LAPAROTOMY 100X72 PEDS (DRAPES) ×1 IMPLANT
DRAPE LAPAROTOMY TRNSV 102X78 (DRAPE) IMPLANT
DRAPE UTILITY XL STRL (DRAPES) ×2 IMPLANT
GAUZE SPONGE 4X4 12PLY STRL (GAUZE/BANDAGES/DRESSINGS) ×2 IMPLANT
GLOVE BIO SURGEON STRL SZ 6 (GLOVE) ×2 IMPLANT
GLOVE BIO SURGEON STRL SZ7 (GLOVE) ×1 IMPLANT
GLOVE BIOGEL PI IND STRL 7.5 (GLOVE) IMPLANT
GLOVE BIOGEL PI INDICATOR 7.5 (GLOVE) ×2
GLOVE INDICATOR 6.5 STRL GRN (GLOVE) ×2 IMPLANT
GOWN STRL REUS W/TWL LRG LVL3 (GOWN DISPOSABLE) ×3 IMPLANT
NDL HYPO 25X1 1.5 SAFETY (NEEDLE) IMPLANT
NEEDLE HYPO 22GX1.5 SAFETY (NEEDLE) IMPLANT
NEEDLE HYPO 25X1 1.5 SAFETY (NEEDLE) IMPLANT
PACK BASIN DAY SURGERY FS (CUSTOM PROCEDURE TRAY) ×2 IMPLANT
PENCIL BUTTON HOLSTER BLD 10FT (ELECTRODE) ×2 IMPLANT
SPONGE LAP 18X18 X RAY DECT (DISPOSABLE) IMPLANT
SPONGE LAP 4X18 RFD (DISPOSABLE) IMPLANT
STAPLER VISISTAT 35W (STAPLE) IMPLANT
SUCTION FRAZIER TIP 10 FR DISP (SUCTIONS) IMPLANT
SUT MNCRL AB 4-0 PS2 18 (SUTURE) ×2 IMPLANT
SUT VIC AB 2-0 SH 27 (SUTURE)
SUT VIC AB 2-0 SH 27XBRD (SUTURE) IMPLANT
SUT VIC AB 3-0 SH 27 (SUTURE) ×2
SUT VIC AB 3-0 SH 27X BRD (SUTURE) ×1 IMPLANT
SYR BULB IRRIGATION 50ML (SYRINGE) ×2 IMPLANT
SYR CONTROL 10ML LL (SYRINGE) ×2 IMPLANT
TOWEL OR 17X24 6PK STRL BLUE (TOWEL DISPOSABLE) ×4 IMPLANT
TUBE CONNECTING 12X1/4 (SUCTIONS) ×1 IMPLANT
YANKAUER SUCT BULB TIP NO VENT (SUCTIONS) ×1 IMPLANT

## 2018-09-16 NOTE — Anesthesia Preprocedure Evaluation (Signed)
Anesthesia Evaluation  Patient identified by MRN, date of birth, ID band Patient awake    Reviewed: Allergy & Precautions, NPO status , Patient's Chart, lab work & pertinent test results  History of Anesthesia Complications (+) PONV  Airway Mallampati: II  TM Distance: >3 FB Neck ROM: Full    Dental no notable dental hx.    Pulmonary neg pulmonary ROS, PE   Pulmonary exam normal breath sounds clear to auscultation       Cardiovascular Normal cardiovascular exam+ dysrhythmias Atrial Fibrillation and Supra Ventricular Tachycardia  Rhythm:Regular Rate:Normal     Neuro/Psych negative neurological ROS  negative psych ROS   GI/Hepatic negative GI ROS, Neg liver ROS,   Endo/Other  Hypothyroidism   Renal/GU negative Renal ROS  negative genitourinary   Musculoskeletal  (+) Fibromyalgia -  Abdominal   Peds negative pediatric ROS (+)  Hematology negative hematology ROS (+)   Anesthesia Other Findings   Reproductive/Obstetrics negative OB ROS                             Anesthesia Physical Anesthesia Plan  ASA: III  Anesthesia Plan: General   Post-op Pain Management:    Induction: Intravenous  PONV Risk Score and Plan: 4 or greater and Midazolam, Dexamethasone, Ondansetron and Treatment may vary due to age or medical condition  Airway Management Planned: LMA and Oral ETT  Additional Equipment:   Intra-op Plan:   Post-operative Plan: Extubation in OR  Informed Consent: I have reviewed the patients History and Physical, chart, labs and discussed the procedure including the risks, benefits and alternatives for the proposed anesthesia with the patient or authorized representative who has indicated his/her understanding and acceptance.   Dental advisory given  Plan Discussed with: CRNA  Anesthesia Plan Comments:         Anesthesia Quick Evaluation

## 2018-09-16 NOTE — Anesthesia Postprocedure Evaluation (Signed)
Anesthesia Post Note  Patient: [name]  Procedure(s) Performed: EXCISION LIPOMA UPPER BACK (Right Back)     Patient location during evaluation: PACU Anesthesia Type: General Level of consciousness: awake and alert Pain management: pain level controlled Vital Signs Assessment: post-procedure vital signs reviewed and stable Respiratory status: spontaneous breathing, nonlabored ventilation, respiratory function stable and patient connected to nasal cannula oxygen Cardiovascular status: blood pressure returned to baseline and stable Postop Assessment: no apparent nausea or vomiting Anesthetic complications: no    Last Vitals:  Vitals:   09/16/18 1015 09/16/18 1030  BP: 100/60 92/69  Pulse: 84 84  Resp: 18 20  Temp:    SpO2: 95% 95%    Last Pain:  Vitals:   09/16/18 1030  TempSrc:   PainSc: 2                  Montez Hageman

## 2018-09-16 NOTE — H&P (Signed)
Surgical H&P  CC: subcutaneous mass upper back  HPI: This is a pleasant and very unfortunate 57 year old woman who is referred here for a subcutaneous mass in her right upper back. This has been present for quite some time, she experiences burning pain in the area, exacerbated by lifting her grandson are using a muscle anyway. Has not noted a change in size of the lesion since she first noticed it. Desires excision to alleviate the pain.  Of note she has a very complicated surgical history including a total abdominal colectomy for colonic inertia and subsequently a gynecological procedure for diagnosis of ovarian cancer which turned out to be a misdiagnosis, apparently this procedure went awry with multiple enterotomies and ultimately her undergoing a total hysterectomy, she was subsequently septic from abdominal sepsis and had to have multiple subsequent abdominal surgeries and enterocutaneous fistulae which took several years to heal, she was followed by Dr. Dossie Der at [facility]. 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 She is also status post left mastectomy for cancer. She has chronic pain and takes Dilaudid twice a day.       Allergies  Allergen Reactions  . Nitrofurantoin Hives  . Bisacodyl Other (See Comments)    Makes patient feel like she is having cramps Other reaction(s): Other (See Comments) Makes patient feel like she is having cramps Makes patient feel like she is having cramps  . Clarithromycin Hives and Other (See Comments)    Other reaction(s): Other (See Comments) Cluster migraines Cluster migraines  . Clarithromycin Other (See Comments)    Cluster migraines   . Clindamycin Hcl Hives  . Codeine Itching  . Monosodium Glutamate Other (See Comments)     Cluster migraines Other reaction(s): Other (See Comments) Cluster migraines Cluster migraines  . Scopolamine Hbr Other (See Comments)    Cluster migraines, impaired vision        Past Medical History:  Diagnosis Date  . Acute renal failure ([place])    renal faliure with surgery   . Anemia   . Anxiety   . Bilateral ovarian cysts   . Blood transfusion without reported diagnosis   . Cancer ([place])    left breast   . Carpal tunnel syndrome   . Clotting disorder ([place])   . Depression   . DVT (deep venous thrombosis) ([place])   . DVT (deep venous thrombosis) ([place])   . E coli infection   . Fibromyalgia   . GERD (gastroesophageal reflux disease)   . H/O bilateral salpingo-oophorectomy 02/28/2015  . Hx of migraines   . Hypercholesterolemia   . Interstitial cystitis   . Irregular heartbeat   . Medically noncompliant 02/28/2015  . PE (pulmonary embolism)   . Perforation bowel ([place])   . Pneumonia   . Sepsis(995.91)   . Stroke ([name])   . SVT (supraventricular tachycardia) ([place])   . Thyroid disease          Past Surgical History:  Procedure Laterality Date  . ABDOMINAL HYSTERECTOMY    . APPENDECTOMY    . AUGMENTATION MAMMAPLASTY Right    2006  . bowel fistula    . BOWEL RESECTION    . BREAST ENHANCEMENT SURGERY  1983  . BREAST SURGERY    . CARPAL TUNNEL RELEASE    .  CERVICAL SPINE SURGERY    . CESAREAN SECTION  1986  . COLON SURGERY    . COSMETIC SURGERY    . ENTEROCUTANEOUS FISTULA CLOSURE N/A 08/09/2013   [facility], Dr Dossie Der  . FRACTURE SURGERY     arm right (as a child)   . heart ablation    . HERNIA REPAIR    . MASTECTOMY Left    with tram 2006  . removal of breast implants  1993  . VENA CAVA FILTER PLACEMENT           Family History  Problem Relation Age of Onset  . Diabetes Mother   . Heart disease Mother   . Hypertension Mother   . Heart disease Father   . Hyperlipidemia Father   .  Hypertension Father   . Alcohol abuse Father   . Colon cancer Maternal Aunt   . Breast cancer Maternal Aunt   . Cancer Maternal Uncle        mets  . Bone cancer Maternal Grandfather        mets  . Ovarian cancer Cousin 55  . Breast cancer Cousin   . Prostate cancer Maternal Uncle     Social History        Socioeconomic History  . Marital status: Married    Spouse name: Not on file  . Number of children: Not on file  . Years of education: Not on file  . Highest education level: Not on file  Occupational History  . Not on file  Social Needs  . Financial resource strain: Not on file  . Food insecurity:    Worry: Not on file    Inability: Not on file  . Transportation needs:    Medical: Not on file    Non-medical: Not on file  Tobacco Use  . Smoking status: Never Smoker  . Smokeless tobacco: Never Used  Substance and Sexual Activity  . Alcohol use: No    Comment: rare  . Drug use: No  . Sexual activity: Yes    Birth control/protection: Surgical  Lifestyle  . Physical activity:    Days per week: Not on file    Minutes per session: Not on file  . Stress: Not on file  Relationships  . Social connections:    Talks on phone: Not on file    Gets together: Not on file    Attends religious service: Not on file    Active member of club or organization: Not on file    Attends meetings of clubs or organizations: Not on file    Relationship status: Not on file  Other Topics Concern  . Not on file  Social History Narrative  . Not on file          Current Outpatient Medications on File Prior to Visit  Medication Sig Dispense Refill  . ALPRAZolam (XANAX) 1 MG tablet TAKE 1 TABLET BY MOUTH TWICE DAILY 60 tablet 0  . Botulinum Toxin Type A (BOTOX) 200 units SOLR     . conjugated estrogens (PREMARIN) vaginal cream Place 1 Applicatorful at bedtime vaginally. 42.5 g 1  . cyclobenzaprine (FLEXERIL) 10 MG tablet TAKE 1 TABLET BY  MOUTH TWICE DAILY AS NEEDED FOR MUSCLE SPASMS GENERIC EQUIVALENT FOR FLEXERIL 180 tablet 0  . DULoxetine (CYMBALTA) 30 MG capsule Take 1 capsule (30 mg total) by mouth 2 (two) times daily. 180 capsule 1  . fenofibrate (TRICOR) 145 MG tablet Take 1 tablet (145 mg total) by mouth daily. [place]  tablet 3  . fluconazole (DIFLUCAN) 150 MG tablet One tablet once daily as needed for vaginal itch 2 tablet 0  . fluticasone (FLONASE) 50 MCG/ACT nasal spray Place 2 sprays into both nostrils daily. 48 g 1  . Glycerin, Laxative, 80.7 % SUPP Place 1 suppository rectally at bedtime as needed (for constipation).     Marland Kitchen HYDROmorphone (DILAUDID) 4 MG tablet One tablet twice daily 20 tablet 0  . levothyroxine (SYNTHROID, LEVOTHROID) 125 MCG tablet TAKE 1 TABLET BY MOUTH DAILY BEFORE BREAKFAST 90 tablet 0  . Multiple Vitamins-Minerals (HAIR/SKIN/NAILS PO) Take 1 tablet by mouth daily.    Marland Kitchen NEXIUM 40 MG capsule Take 1 capsule (40 mg total) by mouth 2 (two) times daily before a meal. 180 capsule 1  . ondansetron (ZOFRAN) 4 MG tablet Take 4 mg by mouth every 8 (eight) hours as needed for nausea or vomiting.    . potassium chloride (K-DUR) 10 MEQ tablet Take 1 tablet (10 mEq total) by mouth daily. 30 tablet 2  . senna-docusate (SENOKOT-S) 8.6-50 MG per tablet Take 2 tablets by mouth 3 (three) times daily. constipation    . topiramate (TOPAMAX) 100 MG tablet Take 1 tablet (100 mg total) by mouth daily. 60 tablet 5  . verapamil (CALAN) 120 MG tablet One tablet once daily as needed for palpitation and chest discomfort (Patient not taking: Reported on 06/23/2018) 90 tablet 1  . vitamin C (ASCORBIC ACID) 500 MG tablet Take 500 mg by mouth daily.      No current facility-administered medications on file prior to visit.     Review of Systems:  General Present- Fatigue, Night Sweats, Weight Gain and Weight Loss. Not Present- Appetite Loss, Chills and Fever. Skin Present- Dryness. Not Present- Change in Wart/Mole, Hives,  Jaundice, New Lesions, Non-Healing Wounds, Rash and Ulcer. HEENT Present- Hoarseness, Ringing in the Ears, Sore Throat and Visual Disturbances. Not Present- Earache, Hearing Loss, Nose Bleed, Oral Ulcers, Seasonal Allergies, Sinus Pain, Wears glasses/contact lenses and Yellow Eyes. Breast Present- Breast Pain. Not Present- Breast Mass, Nipple Discharge and Skin Changes. Cardiovascular Present- Palpitations. Not Present- Chest Pain, Difficulty Breathing Lying Down, Leg Cramps, Rapid Heart Rate, Shortness of Breath and Swelling of Extremities. Gastrointestinal Present- Abdominal Pain, Hemorrhoids and Rectal Pain. Not Present- Bloating, Bloody Stool, Change in Bowel Habits, Chronic diarrhea, Constipation, Difficulty Swallowing, Excessive gas, Gets full quickly at meals, Indigestion, Nausea and Vomiting. Musculoskeletal Present- Back Pain, Joint Pain, Muscle Pain and Muscle Weakness. Not Present- Joint Stiffness and Swelling of Extremities. Neurological Present- Headaches, Tingling and Weakness. Not Present- Decreased Memory, Fainting, Numbness, Seizures, Tremor and Trouble walking. Endocrine Present- Hair Changes. Not Present- Cold Intolerance, Excessive Hunger, Heat Intolerance, Hot flashes and New Diabetes. All other systems negative  Physical Exam: There were no vitals filed for this visit. Gen: alert and cooperative Eye: extraocular motion intact, no scleral icterus ENT: moist mucus membranes, dentition intact Neck: no mass or thyromegaly Chest: unlabored respirations, symmetrical air entry, clear bilaterally CV: regular rate and rhythm, no pedal edema. The palmar aspect of the right hand and fingers are noticeably a little bit swollen and blue compared to the left. She states this is chronic. MSK: strength symmetrical throughout, no deformity Neuro: grossly intact, normal gait Psych: normal mood and affect, appropriate insight Skin: warm and dry. In the right upper back there is a  subcutaneous ovoid lesion which feels somewhat deep but smooth and rubbery consistent with a lipoma which may be intramuscular. Approximately 3-4 cm in length  CBC Latest Ref Rng & Units 07/04/2018 10/28/2017 08/04/2017  WBC 3.4 - 10.8 x10E3/uL 6.5 6.2 6.6  Hemoglobin 11.1 - 15.9 g/dL 13.3 13.4 13.8  Hematocrit 34.0 - 46.6 % 40.3 39.5 40.1  Platelets 150 - 450 x10E3/uL 200 201 189    CMP Latest Ref Rng & Units 04/19/2018 10/28/2017 08/04/2017  Glucose 65 - 99 mg/dL 90 112(H) 133(H)  BUN 6 - 24 mg/dL 22 23(H) 24(H)  Creatinine 0.57 - 1.00 mg/dL 0.99 1.21(H) 1.03(H)  Sodium 134 - 144 mmol/L 144 138 140  Potassium 3.5 - 5.2 mmol/L 4.0 4.0 3.8  Chloride 96 - 106 mmol/L 107(H) 105 109  CO2 20 - 29 mmol/L 21 23 20(L)  Calcium 8.7 - 10.2 mg/dL 9.8 9.5 9.8  Total Protein 6.0 - 8.5 g/dL 6.7 6.8 7.1  Total Bilirubin 0.0 - 1.2 mg/dL 0.4 0.7 0.7  Alkaline Phos 39 - 117 IU/L 68 65 75  AST 0 - 40 IU/L 18 31 38  ALT 0 - 32 IU/L 16 32 48    RecentLabs       Lab Results  Component Value Date   INR 1.10 09/02/2011   INR 1.1 08/11/2009      Imaging: ImagingResults(Last48hours)  No results found.      A/P: Likely a lipoma, possibly intramuscular.  Discussed risks of excision including bleeding, infection, pain, scarring, injury to muscle or adjacent nerves and chronic pain, failure to resolve current pain, recurrence of the lesion.  Questions answered to her satisfaction.  She would like to proceed with excision.

## 2018-09-16 NOTE — Discharge Instructions (Signed)
GENERAL SURGERY: POST OP INSTRUCTIONS  ######################################################################  EAT Gradually transition to a high fiber diet with a fiber supplement over the next few weeks after discharge.  Start with a pureed / full liquid diet (see below)  WALK Walk an hour a day.  Control your pain to do that.    CONTROL PAIN Control pain so that you can walk, sleep, tolerate sneezing/coughing, go up/down stairs.  HAVE A BOWEL MOVEMENT DAILY Keep your bowels regular to avoid problems.  OK to try a laxative to override constipation.  OK to use an antidairrheal to slow down diarrhea.  Call if not better after 2 tries  CALL IF YOU HAVE PROBLEMS/CONCERNS Call if you are still struggling despite following these instructions. Call if you have concerns not answered by these instructions  ######################################################################    1. DIET: Follow a light bland diet the first 24 hours after arrival home, such as soup, liquids, crackers, etc.  Be sure to include lots of fluids daily.  Avoid fast food or heavy meals as your are more likely to get nauseated.   2. Take your usually prescribed home medications unless otherwise directed. 3. PAIN CONTROL: a. Pain is best controlled by a usual combination of three different methods TOGETHER: i. Ice/Heat ii. Over the counter pain medication iii. Prescription pain medication b. Most patients will experience some swelling and bruising around the incisions.  Ice packs or heating pads (30-60 minutes up to 6 times a day) will help. Use ice for the first few days to help decrease swelling and bruising, then switch to heat to help relax tight/sore spots and speed recovery.  Some people prefer to use ice alone, heat alone, alternating between ice & heat.  Experiment to what works for you.  Swelling and bruising can take several weeks to resolve.   c. It is helpful to take an over-the-counter pain medication  regularly for the first few weeks.  Choose one of the following that works best for you: i. Naproxen (Aleve, etc)  Two 220mg  tabs twice a day ii. Ibuprofen (Advil, etc) Three 200mg  tabs four times a day (every meal & bedtime) iii. Acetaminophen (Tylenol, etc) 500-650mg  four times a day (every meal & bedtime) d. A  prescription for pain medication (such as oxycodone, hydrocodone, etc) should be given to you upon discharge.  Take your pain medication as prescribed.  i. If you are having problems/concerns with the prescription medicine (does not control pain, nausea, vomiting, rash, itching, etc), please call us [phone] to see if we need to switch you to a different pain medicine that will work better for you and/or control your side effect better. ii. If you need a refill on your pain medication, please contact your pharmacy.  They will contact our office to request authorization. Prescriptions will not be filled after 5 pm or on week-ends. 4. Avoid getting constipated.  Between the surgery and the pain medications, it is common to experience some constipation.  Increasing fluid intake and taking a fiber supplement (such as Metamucil, Citrucel, FiberCon, MiraLax, etc) 1-2 times a day regularly will usually help prevent this problem from occurring.  A mild laxative (prune juice, Milk of Magnesia, MiraLax, etc) should be taken according to package directions if there are no bowel movements after 48 hours.   5. Wash / shower every day.  You may shower over the skin glue which is waterproof.  Continue to shower over incision(s) after the dressing is off. 6. Skin glue will flake  off after 1-2 weeks.  You may leave the incision open to air.  You may have skin tapes (Steri Strips) covering the incision(s).  Leave them on until one week, then remove.  You may replace a dressing/Band-Aid to cover the incision for comfort if you wish.      7. ACTIVITIES as tolerated:   a. You may resume regular (light)  daily activities beginning the next day--such as daily self-care, walking, climbing stairs--gradually increasing activities as tolerated.  If you can walk 30 minutes without difficulty, it is safe to try more intense activity such as jogging, treadmill, bicycling, low-impact aerobics, swimming, etc. b. Save the most intensive and strenuous activity for last such as sit-ups, heavy lifting, contact sports, etc  Refrain from any heavy lifting or straining until you are off narcotics for pain control.   c. DO NOT PUSH THROUGH PAIN.  Let pain be your guide: If it hurts to do something, don't do it.  Pain is your body warning you to avoid that activity for another week until the pain goes down. d. You may drive when you are no longer taking prescription pain medication, you can comfortably wear a seatbelt, and you can safely maneuver your car and apply brakes. e. Dennis Bast may have sexual intercourse when it is comfortable.  8. FOLLOW UP in our office a. Please call [facility] at [phone] to set up an appointment to see your surgeon in the office for a follow-up appointment approximately 2-3 weeks after your surgery. b. Make sure that you call for this appointment the day you arrive home to insure a convenient appointment time. 9. IF YOU HAVE DISABILITY OR FAMILY LEAVE FORMS, BRING THEM TO THE OFFICE FOR PROCESSING.  DO NOT GIVE THEM TO YOUR DOCTOR.   WHEN TO CALL us [phone]: 1. Poor pain control 2. Reactions / problems with new medications (rash/itching, nausea, etc)  3. Fever over 101.5 F (38.5 C) 4. Worsening swelling or bruising 5. Continued bleeding from incision. 6. Increased pain, redness, or drainage from the incision 7. Difficulty breathing / swallowing   The clinic staff is available to answer your questions during regular business hours (8:30am-5pm).  Please dont hesitate to call and ask to speak to one of our nurses for clinical concerns.   If you have a medical emergency, go to the  nearest emergency room or call 911.  A surgeon from [facility] is always on call at the [facility], [place], [place], [place], [place]  [id number] ? MAIN: [phone] ? TOLL FREE: [phone] ?  FAX [phone] [email or web address]  Post Anesthesia Home Care Instructions  Activity: Get plenty of rest for the remainder of the day. A responsible individual must stay with you for 24 hours following the procedure.  For the next 24 hours, DO NOT: -Drive a car -Paediatric nurse -Drink alcoholic beverages -Take any medication unless instructed by your physician -Make any legal decisions or sign important papers.  Meals: Start with liquid foods such as gelatin or soup. Progress to regular foods as tolerated. Avoid greasy, spicy, heavy foods. If nausea and/or vomiting occur, drink only clear liquids until the nausea and/or vomiting subsides. Call your physician if vomiting continues.  Special Instructions/Symptoms: Your throat may feel dry or sore from the anesthesia or the breathing tube placed in your throat during surgery. If this causes discomfort, gargle with warm salt water. The discomfort should disappear within 24 hours.  If  you had a scopolamine patch placed behind your ear for the management of post- operative nausea and/or vomiting:  1. The medication in the patch is effective for 72 hours, after which it should be removed.  Wrap patch in a tissue and discard in the trash. Wash hands thoroughly with soap and water. 2. You may remove the patch earlier than 72 hours if you experience unpleasant side effects which may include dry mouth, dizziness or visual disturbances. 3. Avoid touching the patch. Wash your hands with soap and water after contact with the patch.    Post Anesthesia Home Care Instructions  Activity: Get plenty of rest for the remainder of the day. A responsible individual must stay with you  for 24 hours following the procedure.  For the next 24 hours, DO NOT: -Drive a car -Paediatric nurse -Drink alcoholic beverages -Take any medication unless instructed by your physician -Make any legal decisions or sign important papers.  Meals: Start with liquid foods such as gelatin or soup. Progress to regular foods as tolerated. Avoid greasy, spicy, heavy foods. If nausea and/or vomiting occur, drink only clear liquids until the nausea and/or vomiting subsides. Call your physician if vomiting continues.  Special Instructions/Symptoms: Your throat may feel dry or sore from the anesthesia or the breathing tube placed in your throat during surgery. If this causes discomfort, gargle with warm salt water. The discomfort should disappear within 24 hours.  If you had a scopolamine patch placed behind your ear for the management of post- operative nausea and/or vomiting:  1. The medication in the patch is effective for 72 hours, after which it should be removed.  Wrap patch in a tissue and discard in the trash. Wash hands thoroughly with soap and water. 2. You may remove the patch earlier than 72 hours if you experience unpleasant side effects which may include dry mouth, dizziness or visual disturbances. 3. Avoid touching the patch. Wash your hands with soap and water after contact with the patch.

## 2018-09-16 NOTE — Op Note (Addendum)
Operative Note  Kalya Troeger  [id number]  [id number]  09/16/2018   Surgeon: Vikki Ports A ConnorMD  Assistant: none  Procedure performed: excision of lipoma, right medial upper back  Preop diagnosis: lipoma, friable and not well organized, overall dimension approximately 1.5x3cm Post-op diagnosis/intraop findings: same  Specimens: lipoma Retained items: none EBL: minimal cc Complications: none  Description of procedure: After obtaining informed consent the patient was taken to the operating room and placed supine on the operating room table wheregeneral LMA anesthesia was initiated, preoperative antibiotics were administered, SCDs applied, and a formal timeout was performed. She was turned in the left lateral decubitus position with all pressure points appropriately padded and the skin surrounding the lipoma in the upper medial back was prepped and draped in the usual sterile fashion. After infiltration with local, a 5 cm incision was made in the Langer's lines and then cautery and blunt dissection were used to dissect through the soft tissues until the lipoma was encountered. It was not well organized and was very friable and so was removed piecemeal. There is no appreciable intramuscular component nor any apparent nerve involvement to explain the patient's complaints of burning pain throughout the shoulder and neck. Hemostasis was ensured within the wound with cautery. Interrupted deep dermal 3-0 Vicryls and running subcuticular 4-0 Monocryl were used to close the skin followed by the application of Dermabond. The patient was then returned to the supine position, awakened, extubated and taken to PACU in stable condition.   All counts were correct at the completion of the case.   [name] husband was informed of the operative findings immediately after the case and was advised of the likelihood of ongoing pain and risk of seroma formation.

## 2018-09-16 NOTE — Transfer of Care (Signed)
Immediate Anesthesia Transfer of Care Note  Patient: [name]  Procedure(s) Performed: Procedure(s) (LRB): EXCISION LIPOMA UPPER BACK (Right)  Patient Location: PACU  Anesthesia Type: General  Level of Consciousness: awake, sedated, patient cooperative and responds to stimulation  Airway & Oxygen Therapy: Patient Spontanous Breathing and Patient connected to [place] O2  Post-op Assessment: Report given to PACU RN, Post -op Vital signs reviewed and stable and Patient moving all extremities  Post vital signs: Reviewed and stable  Complications: No apparent anesthesia complications

## 2018-09-16 NOTE — Anesthesia Procedure Notes (Signed)
Procedure Name: LMA Insertion Date/Time: 09/16/2018 9:09 AM Performed by: Justice Rocher, CRNA Pre-anesthesia Checklist: Patient identified, Emergency Drugs available, Suction available and Patient being monitored Patient Re-evaluated:Patient Re-evaluated prior to induction Oxygen Delivery Method: Circle system utilized Preoxygenation: Pre-oxygenation with 100% oxygen Induction Type: IV induction Ventilation: Mask ventilation without difficulty LMA: LMA inserted LMA Size: 4.0 Number of attempts: 1 Airway Equipment and Method: Bite block Placement Confirmation: positive ETCO2 and breath sounds checked- equal and bilateral Tube secured with: Tape Dental Injury: Teeth and Oropharynx as per pre-operative assessment

## 2018-09-19 ENCOUNTER — Encounter (HOSPITAL_BASED_OUTPATIENT_CLINIC_OR_DEPARTMENT_OTHER): Payer: Self-pay | Admitting: Surgery

## 2018-09-23 DIAGNOSIS — K9089 Other intestinal malabsorption: Secondary | ICD-10-CM | POA: Diagnosis not present

## 2018-09-23 DIAGNOSIS — K909 Intestinal malabsorption, unspecified: Secondary | ICD-10-CM | POA: Insufficient documentation

## 2018-09-23 DIAGNOSIS — Z86711 Personal history of pulmonary embolism: Secondary | ICD-10-CM | POA: Diagnosis not present

## 2018-09-23 DIAGNOSIS — C50912 Malignant neoplasm of unspecified site of left female breast: Secondary | ICD-10-CM | POA: Diagnosis not present

## 2018-09-23 DIAGNOSIS — Z95828 Presence of other vascular implants and grafts: Secondary | ICD-10-CM | POA: Diagnosis not present

## 2018-09-27 ENCOUNTER — Ambulatory Visit: Payer: Self-pay | Admitting: Family Medicine

## 2018-09-28 DIAGNOSIS — K432 Incisional hernia without obstruction or gangrene: Secondary | ICD-10-CM | POA: Diagnosis not present

## 2018-09-28 DIAGNOSIS — K469 Unspecified abdominal hernia without obstruction or gangrene: Secondary | ICD-10-CM | POA: Diagnosis not present

## 2018-10-03 ENCOUNTER — Telehealth: Payer: Self-pay

## 2018-10-03 ENCOUNTER — Telehealth: Payer: Self-pay | Admitting: Family Medicine

## 2018-10-03 ENCOUNTER — Other Ambulatory Visit: Payer: Self-pay | Admitting: Family Medicine

## 2018-10-03 DIAGNOSIS — R1031 Right lower quadrant pain: Secondary | ICD-10-CM

## 2018-10-03 DIAGNOSIS — R1030 Lower abdominal pain, unspecified: Secondary | ICD-10-CM

## 2018-10-03 NOTE — Telephone Encounter (Signed)
Pt is calling in HURTING really bad in stomach (right side)  Doing liquid diet, nothing has changed, still in a lot of pain, Dr Dossie Der sent her an email stating that she needed to come to her PCP and leave a stool sample, however I advised that we dont collect stool samples in the office, and that I would send a note to Dr Moshe Cipro,. Pt states that she can provide a stool sample today if needed.-----[phone]

## 2018-10-03 NOTE — Progress Notes (Unsigned)
amb surg  

## 2018-10-03 NOTE — Telephone Encounter (Signed)
plks call pt if her abd pain is 8 plus, a stool sample only will be insufficient,I recommend eD visit, she has h/o recurrent obstruction

## 2018-10-03 NOTE — Telephone Encounter (Signed)
C/o intermittent RLQ pain x 5 days, was seen in ED at [facility], states her surgeon Pappas responded to her call the following day, and reported no obstruction States she vomited bilious material the following day Friday, one time only  No new fever or chills, but has not felt good for weeks, has upcoming endo appt and has seen oncology and had li[oma removed since her last visit She is able to passs gas and denies abd distension   Please send order to [facility] for testing for viral and bacterial infection let her know when done so she can go I advised I will refer her to Dr Dossie Der for re eval

## 2018-10-03 NOTE — Telephone Encounter (Signed)
Spoke with patient and she states she can only describe the pain as contractions. When it relaxes it completely relaxes however when it hits, it is a 10/10. Dr.Pappas read CT and said there was no obstructions. Bowels looked dilated which is normal for her and will always be normal for her. Received 5mg  Morphine injection at hospital and it eased the pain but didn't take it away completely. Advised her I would let Dr.Simpson know and get back in touch with her. Patient is able to leave stool sample at lab in [place] as she lives in [place].

## 2018-10-03 NOTE — Telephone Encounter (Signed)
Stool culture and gastrointestinal panel ordered per Dr.Simpson and faxed to [facility]. Patient aware

## 2018-10-03 NOTE — Telephone Encounter (Signed)
Orders faxed to [facility] and patient notified with verbal understanding

## 2018-10-05 ENCOUNTER — Telehealth: Payer: Self-pay | Admitting: Family Medicine

## 2018-10-05 NOTE — Telephone Encounter (Signed)
Patient left message requesting you to call her back regarding inaccurate directions ? Something about a speciman lab. Cb#: [phone]

## 2018-10-10 DIAGNOSIS — Z8041 Family history of malignant neoplasm of ovary: Secondary | ICD-10-CM | POA: Diagnosis not present

## 2018-10-10 DIAGNOSIS — Z189 Retained foreign body fragments, unspecified material: Secondary | ICD-10-CM | POA: Diagnosis not present

## 2018-10-10 DIAGNOSIS — E559 Vitamin D deficiency, unspecified: Secondary | ICD-10-CM | POA: Diagnosis not present

## 2018-10-10 DIAGNOSIS — Z8543 Personal history of malignant neoplasm of ovary: Secondary | ICD-10-CM | POA: Diagnosis not present

## 2018-10-10 DIAGNOSIS — E538 Deficiency of other specified B group vitamins: Secondary | ICD-10-CM | POA: Diagnosis not present

## 2018-10-10 DIAGNOSIS — Z853 Personal history of malignant neoplasm of breast: Secondary | ICD-10-CM | POA: Diagnosis not present

## 2018-10-10 DIAGNOSIS — Z803 Family history of malignant neoplasm of breast: Secondary | ICD-10-CM | POA: Diagnosis not present

## 2018-10-10 DIAGNOSIS — K9089 Other intestinal malabsorption: Secondary | ICD-10-CM | POA: Diagnosis not present

## 2018-10-11 DIAGNOSIS — R7989 Other specified abnormal findings of blood chemistry: Secondary | ICD-10-CM | POA: Insufficient documentation

## 2018-10-11 DIAGNOSIS — E538 Deficiency of other specified B group vitamins: Secondary | ICD-10-CM | POA: Insufficient documentation

## 2018-10-12 NOTE — Telephone Encounter (Signed)
pls call the lab and ask for result of her stool test on 10/13/2018

## 2018-10-12 NOTE — Telephone Encounter (Signed)
I called the pt to see what she needed. She apologized it was not me--it was the lab. I advised that I was out of the office as I was sick.

## 2018-10-14 NOTE — Telephone Encounter (Signed)
Per Lauretta Grill at [facility] lab everything was negative. Requested results to be faxed to the office. Will call patient and notify of results.

## 2018-10-14 NOTE — Telephone Encounter (Signed)
Called patient to give results of stool testing. No answer. Left message requesting call back.

## 2018-10-14 NOTE — Telephone Encounter (Signed)
Thanks, noted

## 2018-10-17 ENCOUNTER — Encounter: Payer: Self-pay | Admitting: Family Medicine

## 2018-10-17 NOTE — Telephone Encounter (Signed)
I sent her a message

## 2018-10-17 NOTE — Telephone Encounter (Signed)
Left message requesting call back to give lab results.  

## 2018-10-18 NOTE — Telephone Encounter (Signed)
noted 

## 2018-10-26 DIAGNOSIS — M5416 Radiculopathy, lumbar region: Secondary | ICD-10-CM | POA: Diagnosis not present

## 2018-10-26 DIAGNOSIS — M48061 Spinal stenosis, lumbar region without neurogenic claudication: Secondary | ICD-10-CM | POA: Diagnosis not present

## 2018-10-26 DIAGNOSIS — Z79891 Long term (current) use of opiate analgesic: Secondary | ICD-10-CM | POA: Diagnosis not present

## 2018-10-27 ENCOUNTER — Ambulatory Visit: Payer: Self-pay | Admitting: "Endocrinology

## 2018-11-03 ENCOUNTER — Other Ambulatory Visit: Payer: Self-pay | Admitting: Family Medicine

## 2018-11-07 ENCOUNTER — Ambulatory Visit: Payer: Self-pay | Admitting: Family Medicine

## 2018-11-15 ENCOUNTER — Telehealth: Payer: Self-pay | Admitting: Internal Medicine

## 2018-11-15 NOTE — Telephone Encounter (Signed)
Patient is called stating she needs a clearance letter faxed over to Dr. Donneta Romberg, fax [phone].  Patient states the surgery is set up for 12/6, they are going to remove IVC filter. They surgery is being done in [place], she is wondering if Dr. Danie Chandler knows of anyone who does it locally.

## 2018-11-16 ENCOUNTER — Telehealth: Payer: Self-pay | Admitting: Internal Medicine

## 2018-11-16 NOTE — Telephone Encounter (Signed)
Pt calling today because she is scheduled to have her recalled IVC filter removed by a specialist in [place] on Dec 8. Pt has not been seen since 2017 and should be seen before a clearance can be made. In addition, pt's surgeon's office will need to fax in a surgical clearance form asap for review. Since pt's surgery is scheduled soon and is out of town, an appt has been made on Friday 11/22 with a PA. Pt has verbalized understanding and had no additional questions.

## 2018-11-16 NOTE — Telephone Encounter (Signed)
  Maryann Alar, NP at [facility] needs a current HNP for this patient sent over along with a progress note from the patients visit this Friday 11/18/18. Fax: [phone]

## 2018-11-16 NOTE — Telephone Encounter (Signed)
   [facility] Pre-operative Risk Assessment    Request for surgical clearance:  1. What type of surgery is being performed? IVC filter removal   2. When is this surgery scheduled? Dec 6   3. What type of clearance is required (medical clearance vs. Pharmacy clearance to hold med vs. Both)?  Medical  4. Are there any medications that need to be held prior to surgery and how long? None   5. Practice name and name of physician performing surgery?  Dr Donneta Romberg; [facility]   6. What is your office phone number [phone]    7.   What is your office fax number [phone]  8.   Anesthesia type (None, local, MAC, general) ? General  Provider req last EKG and last Echo (does not need to be recent)   Dollene Primrose 11/16/2018, 3:44 PM  _________________________________________________________________   (provider comments below)

## 2018-11-17 ENCOUNTER — Encounter: Payer: Self-pay | Admitting: Physician Assistant

## 2018-11-17 NOTE — Progress Notes (Signed)
Cardiology Office Note    Date:  11/18/2018  ID:  [name], DOB 09-07-1961, MRN [id number] PCP:  Fayrene Helper, MD  Cardiologist:  Virl Axe, MD   Chief Complaint: pre-op clearance, chest pain, palpitations   History of Present Illness:  [name] is a 57 y.o. female with history of PE 1990s s/p IVC filter 2006, SVT, ?atrial fib/flutter, HLD, thyroid disease, GERD, anxiety, depression, obesity, stroke, fibromyalgia, migraines, IC, breast CA, syncope, ovarian CA misdiagnoses with multiple GI complications who presents for pre-op clearance for an IVC filter removal. She is also noting CP and palpitations.  Arrhythmia history is not very clear. She reports that in 1996 she had 1 ablation and in 1997 she had 2 ablations. She recalls her HR being in the 280-340 range and she had to be personally escorted to [facility] by Dr. Caryl Comes for the second ablation there. Dr. Caryl Comes and Dr. Izell [place] remote notes available in Epic back to 2010 do not mention afib/flutter, only history of "tachycardia ablation." She recalls being told she had SVT and a-fi. Records are limited due to the remote nature. Someone else has entered information in [facility] below prior to our encounter. She was re-evaluated by EP in 2016-2017 for atypical chest pain and episode of syncope. The syncopal spell was prolonged and not felt to be arrhythmic in nature, felt instead to be vasovagal. 10/2015 nuclear stress test did not show any ischemia, small defect of mild severity present in the apical anterior location most consistent with breast attenuation, EF 84%. 2D echo 01/2016 EF 60-65%, grade 2 DD.  She has been through a series of horrific medical complications, the most significant includes a misdiagnosis of ovarian cancer that turned out to be an ovarian cyst in 2012. She received a hysterectomy and apparently that procedure went awry, complicated by dehiscence/evisceration of her organs. She received multiple GI surgeries and at  one point had renal failure and sepsis. She had a PE in the late 1990s and during treatment for breast cancer hemorrhaged so IVC filter was placed in 2006. Since that time she's learned that her [place] filter has eroded through her vena cava and is beginning to ossify in her spine as well. She did extensive research on a surgeon to help and therefore will be seeking care in [place] to have this removed. She states this is planned for December but she thinks it will likely be sometime later than that as she has some things she needs to accomplish before then. She acknowledges PTSD as a result of all of her catastrophic medical misadventures.  Last labs 08/2018 Hgb 12.3, 06/2018 TSH 2.380, LDL 123 (PCP), 05/2018 Cr 1.24, K 3.9.   She presents back for follow-up not only for pre-op eval but also noting CP/palpitations: 1) Chest pain - about 4x/month, lasting seconds up to 10 minutes. Sometimes wakes her out of sleep. Not specifically associated with inspiration, palpation, exertion or meals. She has a family history of CAD and is concerned for a blockage. She walks intermittently but not regularly. No increased dyspnea lately. Pain has been waxing and waning for many years - seen in 2016 for this. She has a hard time describing it but says it feels like what she would expect a heart attack to feel like. 2) Palpitations - intermittent with varying qualities, sometimes fluttering, flipping, a "run" of fast palpitations or a hard thump. She is not sure they would fully be captured on a 24-48 hour monitor as each one does  not specifically happen daily. They are sometimes associated with CP but sometimes occur independently as well.     Past Medical History:  Diagnosis Date  . Anxiety   . Carpal tunnel syndrome   . Chronic constipation   . Chronic pain syndrome    followed by [facility]---  back  . Depression   . Family history of adverse reaction to anesthesia    MOTHER--- PONV  . Fibromyalgia   .  GERD (gastroesophageal reflux disease)   . History of MRSA infection    lip abscess  . History of ovarian cyst 06/2011   s/p  BSO  . History of pulmonary embolus (PE) 1997   post EP with ablation pulmonary veouns for SVT/ Atrial Fib.  . History of supraventricular tachycardia    s/p  ablation 1996  and 1997  by dr Caryl Comes  . History of TIA (transient ischemic attack) 1997   post op EP ablation PE  . Hyperlipidemia   . Hypothyroidism    followed by pcp  . Incisional hernia   . Interstitial cystitis    09-13-2018   per pt last flare-up  May 2019 (followed by pcp)  . Iron deficiency anemia    09-13-2018  PER PT STABLE  . Lipoma of back    upper  . Migraines   . Paget disease of breast, left ([place])   . PONV (postoperative nausea and vomiting)    SEVERE  . PSVT (paroxysmal supraventricular tachycardia) ([place])    [place]  . S/P insertion of IVC (inferior vena caval) filter 05/08/2005   greenfield (non-retrievable)  /  dx 2019  a leg of filter is protruding thru the vena cava in to right L2 vertebral body (09-13-2018  per pt having surgery to remove filter in [place])  . S/P radiofrequency ablation operation for arrhythmia 1996   1996  and 1997,   SVT and Atrial Fib  . Syncope   . Wears glasses     Past Surgical History:  Procedure Laterality Date  . ABDOMINAL HYSTERECTOMY  1987  . ANTERIOR CERVICAL DECOMP/DISCECTOMY FUSION  03-07-2002   dr elsner  @[facility]    C 4 -- 5  . APPENDECTOMY  1980  . BILATERAL SALPINGOOPHORECTOMY  07/24/2011   via Explor. Lap. w/ intraoperative perf. bowel repair  . BREAST ENHANCEMENT SURGERY Bilateral 1993  . BREAST IMPLANT REMOVAL Bilateral   . CARDIAC CATHETERIZATION  07-06-2003   dr Darnell Level brodie   normal coronaries and LVF  . CARDIAC ELECTROPHYSIOLOGY STUDY AND ABLATION  1996  and 1997  . CARDIOVASCULAR STRESS TEST  11-18-2015   dr Caryl Comes   normal nuclear study w/ no ischemia/  normal LV function and wall motion , ef 84%  . CARPAL  TUNNEL RELEASE Right ?  . [place]  . COLON SURGERY    . CYSTO/  HYDRODISTENTION/  INSTILATION THERAPY  MULTIPLE  . ENTEROCUTANEOUS FISTULA CLOSURE  multiple   last one 2015 with small bowel resection  . EXPLORATORY LAPAROTOMY INCISIONAL VENTRAL HERNIA REPAIR / RESECTION SMALL BOWEL  11-09-2014   @[facility]   . LIPOMA EXCISION Right 09/16/2018   Procedure: EXCISION LIPOMA UPPER BACK;  Surgeon: Clovis Riley, MD;  Location: [facility];  Service: General;  Laterality: Right;  . MASTECTOMY Left 2006   w/ reconstruction on left (paget's disease)  and right breast augmentation  . TOTAL COLECTOMY  08-04-2002    @[facility]    AND CHOLECYSTECTOMY  (colonic inertia)  .  TRANSTHORACIC ECHOCARDIOGRAM  02/04/2016   ef 60-65%,  grade 2 diastolic dysfunction/  mild MR  . VENA CAVA FILTER PLACEMENT  05/08/2005   @[facility]    greenfield (non-retrievable)  . WIDE EXCISION PERIRECTAL ABSCESSES  09-22-2005   @ [facility]    Current Medications: Current Meds  Medication Sig  . ALPRAZolam (XANAX) 1 MG tablet Take 1 tablet (1 mg total) by mouth 2 (two) times daily.  Marland Kitchen aspirin 81 MG chewable tablet Chew 81 mg by mouth daily.  Marland Kitchen conjugated estrogens (PREMARIN) vaginal cream Place 1 Applicatorful vaginally at bedtime.  . cyanocobalamin (,VITAMIN B-12,) 1000 MCG/ML injection INJECT 1 ML INTO THE MUSCLE EVERY 30 DAYS.  Marland Kitchen cyclobenzaprine (FLEXERIL) 10 MG tablet Take 1 tablet by mouth twice daily as needed for muscle spasms  . cycloSPORINE (RESTASIS) 0.05 % ophthalmic emulsion Place 1 drop into both eyes 2 (two) times daily.  . DULoxetine (CYMBALTA) 30 MG capsule Take 1 capsule (30 mg total) by mouth 2 (two) times daily.  . fluticasone (FLONASE) 50 MCG/ACT nasal spray Place 2 sprays into both nostrils as needed for allergies or rhinitis.  . Glycerin, Laxative, 80.7 % SUPP Place 1 suppository rectally at bedtime as needed (for constipation).   Marland Kitchen HYDROmorphone (DILAUDID) 4 MG tablet One tablet twice daily    . levothyroxine (SYNTHROID, LEVOTHROID) 125 MCG tablet TAKE 1 TABLET BY MOUTH DAILY BEFORE BREAKFAST  . metoprolol tartrate (LOPRESSOR) 25 MG tablet Take 25 mg by mouth 2 (two) times daily as needed (palpitations).   . Multiple Vitamins-Minerals (HAIR/SKIN/NAILS PO) Take 1 tablet by mouth every evening.   . Multiple Vitamins-Minerals (ICAPS AREDS 2) CAPS Take by mouth at bedtime.  Marland Kitchen NEXIUM 40 MG capsule Take 1 capsule (40 mg total) by mouth 2 (two) times daily before a meal.  . ondansetron (ZOFRAN) 4 MG tablet Take 4 mg by mouth every 8 (eight) hours as needed for nausea or vomiting.  . potassium chloride (K-DUR) 10 MEQ tablet Take 1 tablet (10 mEq total) by mouth daily.  . promethazine (PHENERGAN) 25 MG tablet Take 1 tablet by mouth as needed.  . rosuvastatin (CRESTOR) 5 MG tablet Take 1 tablet (5 mg total) by mouth at bedtime.  . senna-docusate (SENOKOT-S) 8.6-50 MG per tablet Take 2 tablets by mouth 3 (three) times daily. constipation   . topiramate (TOPAMAX) 100 MG tablet Take 1 tablet (100 mg total) by mouth daily.  . Vitamin D, Ergocalciferol, (DRISDOL) 50000 units CAPS capsule Take 50,000 Units by mouth every 7 (seven) days.       Allergies:   Nitrofurantoin; Bisacodyl; Clarithromycin; Clarithromycin; Clindamycin hcl; Codeine; Monosodium glutamate; and Scopolamine hbr   Social History   Socioeconomic History  . Marital status: Married    Spouse name: Not on file  . Number of children: Not on file  . Years of education: Not on file  . Highest education level: Not on file  Occupational History  . Not on file  Social Needs  . Financial resource strain: Not on file  . Food insecurity:    Worry: Not on file    Inability: Not on file  . Transportation needs:    Medical: Not on file    Non-medical: Not on file  Tobacco Use  . Smoking status: Never Smoker  . Smokeless tobacco: Never Used  Substance and Sexual Activity  . Alcohol use: No  . Drug use: No  . Sexual activity:  Yes    Birth control/protection: Surgical  Lifestyle  . Physical activity:  Days per week: Not on file    Minutes per session: Not on file  . Stress: Not on file  Relationships  . Social connections:    Talks on phone: Not on file    Gets together: Not on file    Attends religious service: Not on file    Active member of club or organization: Not on file    Attends meetings of clubs or organizations: Not on file    Relationship status: Not on file  Other Topics Concern  . Not on file  Social History Narrative  . Not on file     Family History:  [name] family history includes Alcohol abuse in her father; Bone cancer in her maternal grandfather; Breast cancer in her cousin and maternal aunt; Cancer in her maternal uncle; Colon cancer in her maternal aunt; Diabetes in her mother; Heart disease in her father and mother; Hyperlipidemia in her father; Hypertension in her father and mother; Ovarian cancer (age of onset: 77) in her cousin; Prostate cancer in her maternal uncle.  ROS:   Please see the history of present illness.  All other systems are reviewed and otherwise negative.    PHYSICAL EXAM:   VS:  BP 110/82   Pulse 87   Ht 5\' 4"  (1.626 m)   Wt 217 lb (98.4 kg)   SpO2 95%   BMI 37.25 kg/m   BMI: Body mass index is 37.25 kg/m. GEN: Well nourished, well developed obese WF, in no acute distress HEENT: normocephalic, atraumatic Neck: no JVD, carotid bruits, or masses Cardiac: RRR; no murmurs, rubs, or gallops, no edema  Respiratory:  clear to auscultation bilaterally, normal work of breathing GI: soft, nontender, nondistended, + BS MS: no deformity or atrophy Skin: warm and dry, no rash Neuro:  Alert and Oriented x 3, Strength and sensation are intact, follows commands Psych: euthymic mood, full affect  Wt Readings from Last 3 Encounters:  11/18/18 217 lb (98.4 kg)  09/16/18 212 lb 14.4 oz (96.6 kg)  08/25/18 206 lb 0.6 oz (93.5 kg)      Studies/Labs  Reviewed:   EKG:  EKG was ordered today and personally reviewed by me and demonstrates NSR 87bpm with lower voltage QRS, nonspecific TW changes  Recent Labs: 04/19/2018: ALT 16; BUN 22; Creatinine, Ser 0.99; Potassium 4.0; Sodium 144 07/04/2018: Platelets 200; TSH 2.380 09/16/2018: Hemoglobin 12.3   Lipid Panel    Component Value Date/Time   CHOL 214 (H) 07/04/2018 1454   TRIG 214 (H) 07/04/2018 1454   HDL 48 07/04/2018 1454   CHOLHDL 4.5 (H) 07/04/2018 1454   CHOLHDL 3.6 08/04/2017 1446   VLDL 32 08/04/2017 1446   LDLCALC 123 (H) 07/04/2018 1454   LDLDIRECT 138 (H) 09/15/2012 1540    Additional studies/ records that were reviewed today include: Summarized above  ASSESSMENT & PLAN:   1. Precordial chest pain - symptoms are mostly atypical but given need for pre-op clearance and fairly persistent sx since 2016 without definitive testing, will proceed with cardiac CT to evaluate. She indicates she was told the IVC filter surgery will be long. If CT is low risk I expect to clear her. Check baseline labs today. She is not tachycardic, tachypneic or hypoxic. Symptoms are not pleuritic in nature. She questions if it could be related to her hernia but testing will allow [place] to exclude CAD. 2. Palpitations - proceed with 30 day monitor to assess. Check baseline labs. I do not think this needs to be  completed by the time she has surgery. I expect some of her palpitations represent PACs/PVCs based on description but these would not explain her "runs" that she's felt. We also discussed Jodelle Red as a potential way of following beyond the event monitor. 3. Pre-op cardiovascular examination - will await completion of the above to return to my inbasket before finalizing clearance plans. The patient indicates the surgery will not take place until after December, but will try to expedite CT given need to make a decision. The pre-op clearance request also includes request for last EKG and echo so will plan to  bundle the formal recommendation once we are ready to clear. I will route this note to the fax number listed. 4. History of syncope - quiescent. 5. PSVT - reassess with monitor as above.  Disposition: F/u with myself in 3 months.   Medication Adjustments/Labs and Tests Ordered: Current medicines are reviewed at length with the patient today.  Concerns regarding medicines are outlined above. Medication changes, Labs and Tests ordered today are summarized above and listed in the Patient Instructions accessible in Encounters.   Signed, Charlie Pitter, PA-C  11/18/2018 2:26 PM    [facility] [place], [place], [place]  [address] Phone: [phone]; Fax: [phone]

## 2018-11-18 ENCOUNTER — Encounter: Payer: Self-pay | Admitting: Physician Assistant

## 2018-11-18 ENCOUNTER — Ambulatory Visit: Payer: Medicare Other | Admitting: Physician Assistant

## 2018-11-18 ENCOUNTER — Other Ambulatory Visit: Payer: Self-pay | Admitting: Physician Assistant

## 2018-11-18 VITALS — BP 110/82 | HR 87 | Ht 64.0 in | Wt 217.0 lb

## 2018-11-18 DIAGNOSIS — R002 Palpitations: Secondary | ICD-10-CM | POA: Diagnosis not present

## 2018-11-18 DIAGNOSIS — Z87898 Personal history of other specified conditions: Secondary | ICD-10-CM

## 2018-11-18 DIAGNOSIS — I471 Supraventricular tachycardia: Secondary | ICD-10-CM | POA: Diagnosis not present

## 2018-11-18 DIAGNOSIS — R072 Precordial pain: Secondary | ICD-10-CM | POA: Diagnosis not present

## 2018-11-18 DIAGNOSIS — Z01810 Encounter for preprocedural cardiovascular examination: Secondary | ICD-10-CM | POA: Diagnosis not present

## 2018-11-18 MED ORDER — METOPROLOL TARTRATE 25 MG PO TABS: 25.0000 mg | ORAL_TABLET | Freq: Two times a day (BID) | ORAL | 3 refills | Status: DC | PRN

## 2018-11-18 NOTE — Telephone Encounter (Signed)
Pt last seen in 01/2016. LMTCB. Request is from [place]. If she is in [place] could possibly send our echo, NST, EKG but cannot actually clear pt. Will discuss with pt.

## 2018-11-18 NOTE — Patient Instructions (Addendum)
Medication Instructions:  1. Your physician recommends that you continue on your current medications as directed. Please refer to the Current Medication list given to you today.  If you need a refill on your cardiac medications before your next appointment, please call your pharmacy.   Lab work: 1. TODAY BMET, MAGNESIUM LEVEL, CBC, TSH  2. BMET TO BE DONE 1 WEEK BEFORE CARDIAC CT  If you have labs (blood work) drawn today and your tests are completely normal, you will receive your results only by: Marland Kitchen MyChart Message (if you have MyChart) OR . A paper copy in the mail If you have any lab test that is abnormal or we need to change your treatment, we will call you to review the results.  Testing/Procedures: Your physician has requested that you have cardiac CT. Cardiac computed tomography (CT) is a painless test that uses an x-ray machine to take clear, detailed pictures of your heart. For further information please visit [email or web address]tn. Please follow instruction sheet as given. THIS WILL BE DONE AT [facility].    Follow-Up: At [facility], you and your health needs are our priority.  As part of our continuing mission to provide you with exceptional heart care, we have created designated Provider Care Teams.  These Care Teams include your primary Cardiologist (physician) and Advanced Practice Providers (APPs -  Physician Assistants and Nurse Practitioners) who all work together to provide you with the care you need, when you need it. You will need a follow up appointment in 3 months WITH [name], PAC   Any Other Special Instructions Will Be Listed Below (If Applicable). Please arrive at the [facility] main entrance of [facility] at xx:xx AM (30-45 minutes prior to test start time)  [facility] [place], [place] [address] [phone]  Proceed to the [facility] Radiology Department (First Floor).  Please follow these instructions  carefully (unless otherwise directed):  On the Night Before the Test: . Be sure to Drink plenty of water. . Do not consume any caffeinated/decaffeinated beverages or chocolate 12 hours prior to your test. . Do not take any antihistamines 12 hours prior to your test.  On the Day of the Test: . Drink plenty of water. Do not drink any water within one hour of the test. . Do not eat any food 4 hours prior to the test. . You may take your regular medications prior to the test.  . Take metoprolol (Lopressor) two hours prior to test   *For Clinical Staff only. Please instruct patient the following:*        -Drink plenty of water       -Take metoprolol (Lopressor) 2 hours prior to test (if applicable).    -IF HR is greater than 55 BPM and patient is less         than or equal to 57 yrs old Lopressor 100mg  x1.                 -If HR is less than 55 BPM- No Beta Blocker                              After the Test: . Drink plenty of water. . After receiving IV contrast, you may experience a mild flushed feeling. This is normal. . On occasion, you may experience a mild rash up to 24 hours after the test. This is not dangerous.  If this occurs, you can take Benadryl 25 mg and increase your fluid intake. . If you experience trouble breathing, this can be serious. If it is severe call 911 IMMEDIATELY. If it is mild, please call our office.

## 2018-11-19 LAB — TSH: TSH: 4.44 u[IU]/mL (ref 0.450–4.500)

## 2018-11-19 LAB — BASIC METABOLIC PANEL
BUN/Creatinine Ratio: 16 (ref 9–23)
BUN: 14 mg/dL (ref 6–24)
CALCIUM: 9.4 mg/dL (ref 8.7–10.2)
CO2: 21 mmol/L (ref 20–29)
Chloride: 102 mmol/L (ref 96–106)
Creatinine, Ser: 0.87 mg/dL (ref 0.57–1.00)
GFR, EST AFRICAN AMERICAN: 86 mL/min/{1.73_m2} (ref >59)
GFR, EST NON AFRICAN AMERICAN: 74 mL/min/{1.73_m2} (ref >59)
Glucose: 95 mg/dL (ref 65–99)
Potassium: 3.9 mmol/L (ref 3.5–5.2)
Sodium: 141 mmol/L (ref 134–144)

## 2018-11-19 LAB — CBC
HEMATOCRIT: 40.6 % (ref 34.0–46.6)
Hemoglobin: 14 g/dL (ref 11.1–15.9)
MCH: 30.2 pg (ref 26.6–33.0)
MCHC: 34.5 g/dL (ref 31.5–35.7)
MCV: 88 fL (ref 79–97)
Platelets: 177 10*3/uL (ref 150–450)
RBC: 4.64 x10E6/uL (ref 3.77–5.28)
RDW: 12.4 % (ref 12.3–15.4)
WBC: 6.3 10*3/uL (ref 3.4–10.8)

## 2018-11-19 LAB — MAGNESIUM: Magnesium: 1.6 mg/dL (ref 1.6–2.3)

## 2018-11-21 ENCOUNTER — Telehealth: Payer: Self-pay | Admitting: *Deleted

## 2018-11-21 NOTE — Telephone Encounter (Signed)
I will f/u results. I sent my office note to surgeon's office. Patient indicated to me she does not plan to pursue date that was listed as she instead has several things to accomplish before then. Khaleelah Yowell PA-C

## 2018-11-21 NOTE — Telephone Encounter (Signed)
Reviewed with D. Dunn, PA-C.  She will provide final recommendations regarding surgical risk once further testing is completed. This note will be removed from the preop pool. Richardson Dopp, PA-C    11/21/2018 2:18 PM

## 2018-11-21 NOTE — Telephone Encounter (Signed)
Patient was just seen last Friday by Melina Copa for preop clearance. Waiting for cardiac CT to be done prior to preop clearance.

## 2018-11-21 NOTE — Telephone Encounter (Signed)
-----   Message from Charlie Pitter, [place] sent at 11/21/2018  7:53 AM EST ----- Please call patient. Labs are stable. In fact, kidney function is totally normal (has had some renal insufficiency in the past). The only abnormality which might contribute to her PVCs would be her magnesium level being a little on the lower side. We like it closer to 2.0. Please increase dietary intake of healthy sources of magnesium including leafy greens, nuts, seeds, fish, beans, whole grains, avocados, yogurt, and bananas. Long term use of PPI like the Nexium she is on can contribute so she should discuss if she should continue versus consider an alternative with the provider that prescribes that. Otherwise I would suggest starting Mag Ox 400mg  BID with recheck Mag in 1-2 weeks. If she comes off the Nexium to an alternative, would do MagOx 400mg  daily - but I do not want to stop that myself since we do not follow her for that medicine, and it should always be discussed with the provider that manages that (Dr. Griffin Dakin name is next to rx). Pt is pending scheduling for cardiac CT so may be able to match up with the pre-CT BMET if needed at that time.  She was also supposed to have  30 day event monitor - order is in, but I do not see that this was included in [facility] [place] provided. Please make sure this reached schedulers. Dayna Dunn PA-C

## 2018-11-21 NOTE — Telephone Encounter (Signed)
Called pt re: lab results. Left a message for pt to call back. 

## 2018-11-22 ENCOUNTER — Telehealth: Payer: Self-pay | Admitting: *Deleted

## 2018-11-22 DIAGNOSIS — R790 Abnormal level of blood mineral: Secondary | ICD-10-CM

## 2018-11-22 MED ORDER — MAGNESIUM OXIDE 400 (241.3 MG) MG PO TABS: 400.0000 mg | ORAL_TABLET | Freq: Two times a day (BID) | ORAL | 3 refills | Status: DC

## 2018-11-22 NOTE — Telephone Encounter (Signed)
-----   Message from Charlie Pitter, [place] sent at 11/22/2018 11:18 AM EST ----- Omeprazole is in the same class as Nexium so would expect the same outcome - the alternative would be changing to an H2 blocker like Pepcid (I.e. 20 BID). So if she switches to Omeprazole, still needs to discuss with prescribing provider as this can still cause low Mag and therefore needs supplementation still. Yes, it can cause cramping Dayna Dunn PA-C

## 2018-11-22 NOTE — Telephone Encounter (Signed)
Pt returned my call and she has been made aware of her lab results. See result note. 

## 2018-11-27 ENCOUNTER — Encounter: Payer: Self-pay | Admitting: Family Medicine

## 2018-11-28 ENCOUNTER — Other Ambulatory Visit: Payer: Self-pay | Admitting: Family Medicine

## 2018-11-28 MED ORDER — ROSUVASTATIN CALCIUM 5 MG PO TABS: 5.0000 mg | ORAL_TABLET | Freq: Every day | ORAL | 3 refills | Status: DC

## 2018-11-28 MED ORDER — FAMOTIDINE 40 MG PO TABS: 40.0000 mg | ORAL_TABLET | Freq: Two times a day (BID) | ORAL | 3 refills | Status: DC

## 2018-12-05 NOTE — Telephone Encounter (Signed)
Please assist Barbara Urbanski PA-C

## 2018-12-07 ENCOUNTER — Other Ambulatory Visit: Payer: Self-pay

## 2018-12-09 ENCOUNTER — Telehealth: Payer: Self-pay | Admitting: *Deleted

## 2018-12-09 NOTE — Telephone Encounter (Signed)
Patient need to discuss her meds.   She take Toprol but was giving rx for metoprolol at last visit.

## 2018-12-09 NOTE — Telephone Encounter (Signed)
Returned call to pt.  She was wanting to clarify her Lopressor dose for the CT scan. Pt has been advised that it is protocol for Cardiac CT  / based upon age and HR to take Lopressor before CT is done. She thanked me for the call back.

## 2018-12-15 ENCOUNTER — Telehealth (HOSPITAL_COMMUNITY): Payer: Self-pay | Admitting: Emergency Medicine

## 2018-12-15 NOTE — Telephone Encounter (Signed)
Reaching out to patient to offer assistance regarding upcoming cardiac imaging study; pt verbalizes understanding of appt date/time, parking situation and where to check in, pre-test NPO status and medications ordered, and verified current allergies;  Pt states she CANNOT have nitroglycerin d/t low BPs and cluster migraine hx;  name and call back number provided for further questions should they arise Marchia Bond RN Navigator Cardiac Imaging [phone]

## 2018-12-19 ENCOUNTER — Other Ambulatory Visit: Payer: Self-pay

## 2018-12-19 ENCOUNTER — Ambulatory Visit (HOSPITAL_COMMUNITY): Payer: Medicare Other

## 2018-12-19 ENCOUNTER — Ambulatory Visit (HOSPITAL_COMMUNITY): Admission: RE | Admit: 2018-12-19 | Payer: Medicare Other | Source: Ambulatory Visit

## 2018-12-30 DIAGNOSIS — N390 Urinary tract infection, site not specified: Secondary | ICD-10-CM | POA: Diagnosis not present

## 2019-01-02 ENCOUNTER — Ambulatory Visit: Payer: Self-pay | Admitting: "Endocrinology

## 2019-01-05 ENCOUNTER — Other Ambulatory Visit: Payer: Self-pay

## 2019-01-09 ENCOUNTER — Other Ambulatory Visit: Payer: Medicare Other | Admitting: *Deleted

## 2019-01-09 ENCOUNTER — Ambulatory Visit (INDEPENDENT_AMBULATORY_CARE_PROVIDER_SITE_OTHER): Payer: Medicare Other

## 2019-01-09 DIAGNOSIS — R790 Abnormal level of blood mineral: Secondary | ICD-10-CM

## 2019-01-09 DIAGNOSIS — R002 Palpitations: Secondary | ICD-10-CM | POA: Diagnosis not present

## 2019-01-10 ENCOUNTER — Telehealth (HOSPITAL_COMMUNITY): Payer: Self-pay | Admitting: Emergency Medicine

## 2019-01-10 LAB — BASIC METABOLIC PANEL
BUN/Creatinine Ratio: 21 (ref 9–23)
BUN: 24 mg/dL (ref 6–24)
CO2: 21 mmol/L (ref 20–29)
CREATININE: 1.15 mg/dL — AB (ref 0.57–1.00)
Calcium: 9.6 mg/dL (ref 8.7–10.2)
Chloride: 103 mmol/L (ref 96–106)
GFR calc Af Amer: 61 mL/min/{1.73_m2} (ref >59)
GFR, EST NON AFRICAN AMERICAN: 53 mL/min/{1.73_m2} — AB (ref >59)
Glucose: 80 mg/dL (ref 65–99)
Potassium: 3.9 mmol/L (ref 3.5–5.2)
SODIUM: 139 mmol/L (ref 134–144)

## 2019-01-10 LAB — MAGNESIUM: Magnesium: 1.8 mg/dL (ref 1.6–2.3)

## 2019-01-10 NOTE — Telephone Encounter (Signed)
Reaching out to patient to offer assistance regarding upcoming cardiac imaging study; pt verbalizes understanding of appt date/time, parking situation and where to check in, pre-test NPO status and medications ordered, and verified current allergies; name and call back number provided for further questions should they arise Lovie Zarling RN Navigator Cardiac Imaging [phone] 

## 2019-01-11 ENCOUNTER — Ambulatory Visit (HOSPITAL_COMMUNITY)
Admission: RE | Admit: 2019-01-11 | Discharge: 2019-01-11 | Disposition: A | Discharge status: Home / Self Care | Payer: Medicare Other | Source: Ambulatory Visit | Attending: Physician Assistant | Admitting: Physician Assistant

## 2019-01-11 ENCOUNTER — Ambulatory Visit (HOSPITAL_COMMUNITY): Payer: Medicare Other

## 2019-01-11 DIAGNOSIS — I471 Supraventricular tachycardia: Secondary | ICD-10-CM | POA: Insufficient documentation

## 2019-01-11 DIAGNOSIS — R072 Precordial pain: Secondary | ICD-10-CM | POA: Diagnosis not present

## 2019-01-11 DIAGNOSIS — Z01810 Encounter for preprocedural cardiovascular examination: Secondary | ICD-10-CM

## 2019-01-11 MED ORDER — NITROGLYCERIN 0.4 MG SL SUBL
0.4000 mg | SUBLINGUAL_TABLET | Freq: Once | SUBLINGUAL | Status: AC
  Administered 2019-01-11: 0.4 mg via SUBLINGUAL
  Filled 2019-01-11: qty 25

## 2019-01-11 MED ORDER — METOPROLOL TARTRATE 5 MG/5ML IV SOLN
INTRAVENOUS | Status: AC
  Filled 2019-01-11: qty 10

## 2019-01-11 MED ORDER — SODIUM CHLORIDE 0.9 % IV SOLN
Freq: Once | INTRAVENOUS | Status: AC
  Administered 2019-01-11: 16:00:00 via INTRAVENOUS

## 2019-01-11 MED ORDER — IOPAMIDOL (ISOVUE-370) INJECTION 76%
100.0000 mL | Freq: Once | INTRAVENOUS | Status: AC | PRN
  Administered 2019-01-11: 100 mL via INTRAVENOUS

## 2019-01-11 MED ORDER — NITROGLYCERIN 0.4 MG SL SUBL
SUBLINGUAL_TABLET | SUBLINGUAL | Status: AC
  Filled 2019-01-11: qty 1

## 2019-01-11 MED ORDER — NITROGLYCERIN 0.4 MG SL SUBL: 0.8000 mg | SUBLINGUAL_TABLET | Freq: Once | SUBLINGUAL | Status: DC

## 2019-01-11 MED ORDER — METOPROLOL TARTRATE 5 MG/5ML IV SOLN
5.0000 mg | INTRAVENOUS | Status: DC | PRN
  Administered 2019-01-11: 5 mg via INTRAVENOUS
  Filled 2019-01-11: qty 5

## 2019-01-12 ENCOUNTER — Telehealth: Payer: Self-pay

## 2019-01-12 ENCOUNTER — Telehealth: Payer: Self-pay | Admitting: Physician Assistant

## 2019-01-12 ENCOUNTER — Telehealth: Payer: Self-pay | Admitting: *Deleted

## 2019-01-12 DIAGNOSIS — Z79899 Other long term (current) drug therapy: Secondary | ICD-10-CM

## 2019-01-12 DIAGNOSIS — R300 Dysuria: Secondary | ICD-10-CM

## 2019-01-12 DIAGNOSIS — N3010 Interstitial cystitis (chronic) without hematuria: Secondary | ICD-10-CM

## 2019-01-12 DIAGNOSIS — I272 Pulmonary hypertension, unspecified: Secondary | ICD-10-CM

## 2019-01-12 NOTE — Telephone Encounter (Signed)
Called pt re: ct results, left a message for her to call back.

## 2019-01-12 NOTE — Telephone Encounter (Signed)
-----   Message from Charlie Pitter, PA-C sent at 01/12/2019  9:38 AM EST ----- Please let Ms. Pies know excellent news. Her cardiac CT showed absolutely no evidence of heart blockage so her chest pain is not related whatsoever to any heart blockages. I hope this will put her mind at ease. There as a suggestion of mildly dilated pulmonary artery suggestive of elevated blood pressures in the lungs. This can be seen in patients with history of PE (like her) or patients who are overweight. The best way to evaluate this is with a heart ultrasound to reassess the pressures in the heart without having to do a procedure. Her echo in 2017 did not suggest this finding so may be new. This should not impact her clearance for surgery. I will send a separate telephone note for cardiac clearance. Await monitor as well.  So can we schedule her for an echo for possible pulmonary hypertension? Also needs post- CT BMET to trend given mild renal insufficiency to be done around ~01/16/19. Would be nice if we can schedule these things together to save a trip. Dayna Dunn PA-C

## 2019-01-12 NOTE — Telephone Encounter (Signed)
Called patient and notified her that prior auth for nexium had been approved through 01/13/2020.  While on the phone she stated that she once again had an e-coli infection in her bladder and kidneys. She had been diagnosed at the urgent care clinic.

## 2019-01-12 NOTE — Telephone Encounter (Signed)
   Primary Cardiologist: Virl Axe, MD  Chart reviewed as part of pre-operative protocol coverage. I evaluated this patient in clinic and ordered a cardiac CT which showed no evidence of CAD. Given past medical history and time since last visit, based on ACC/AHA guidelines, [name] would be at acceptable risk for the planned procedure without further cardiovascular testing. She does have an echo in process to evaluate mild pulmonary HTN seen on CT, and event monitor for palpitations but I do not feel this should delay her surgery.  I will route this recommendation to the requesting party via Epic fax function (Dr. Donneta Romberg as outlined in 11/16/18 phone note).  Please call with questions.  Charlie Pitter, PA-C 01/12/2019, 9:38 AM

## 2019-01-13 ENCOUNTER — Telehealth: Payer: Self-pay | Admitting: *Deleted

## 2019-01-13 NOTE — Telephone Encounter (Signed)
[name] with [employer] called  Wanting a prior authorization for pt. There was a question about quantity of omeprazole.

## 2019-01-13 NOTE — Telephone Encounter (Signed)
Called [employer] back and provided information they requested. Although it stated approved on the website covermymeds, the medication has not been approved. Should hear something before noon. If not they will fax response.

## 2019-01-16 ENCOUNTER — Other Ambulatory Visit (HOSPITAL_COMMUNITY): Payer: Self-pay

## 2019-01-16 ENCOUNTER — Other Ambulatory Visit: Payer: Self-pay

## 2019-01-16 NOTE — Telephone Encounter (Signed)
Follow up    Pt calling back regarding ct scan

## 2019-01-16 NOTE — Telephone Encounter (Signed)
I called and spoke with patient, she is aware of CT results; she does need the echo and post CT BMET scheduled.

## 2019-01-17 ENCOUNTER — Encounter: Payer: Self-pay | Admitting: *Deleted

## 2019-01-17 NOTE — Telephone Encounter (Signed)
Left detailed message on pts voicemail re: lab work and echo that needed to be scheduled. Sent mychart message as well. Pt don't need a lab appt, and have advised her that someone from our scheduling dept will call her to schedule the echo. Advised her that if she had any questions after she received my message, to call me back.

## 2019-01-18 ENCOUNTER — Telehealth: Payer: Self-pay | Admitting: Physician Assistant

## 2019-01-18 NOTE — Telephone Encounter (Signed)
Pt returned my call.  She will have her Echo and lab work done tomorrow, 01/18/2019. She wanted me to mention to [facility] that she has IVC filter in and that it has broken and she was wondering if you noticed on the CT anything abnormal?  She said she knew pts that this has happened to in the past, also has pulmonary hypertension.  Please advise!

## 2019-01-18 NOTE — Telephone Encounter (Signed)
Ow Up:      Returning your call from yesterday.

## 2019-01-19 ENCOUNTER — Ambulatory Visit (HOSPITAL_COMMUNITY): Payer: Medicare Other | Attending: Cardiovascular Disease

## 2019-01-19 ENCOUNTER — Other Ambulatory Visit: Payer: Medicare Other | Admitting: *Deleted

## 2019-01-19 ENCOUNTER — Other Ambulatory Visit: Payer: Self-pay

## 2019-01-19 DIAGNOSIS — I272 Pulmonary hypertension, unspecified: Secondary | ICD-10-CM | POA: Diagnosis not present

## 2019-01-19 DIAGNOSIS — Z79899 Other long term (current) drug therapy: Secondary | ICD-10-CM

## 2019-01-19 LAB — BASIC METABOLIC PANEL
BUN/Creatinine Ratio: 20 (ref 9–23)
BUN: 16 mg/dL (ref 6–24)
CO2: 22 mmol/L (ref 20–29)
CREATININE: 0.79 mg/dL (ref 0.57–1.00)
Calcium: 9.6 mg/dL (ref 8.7–10.2)
Chloride: 103 mmol/L (ref 96–106)
GFR calc Af Amer: 96 mL/min/{1.73_m2} (ref >59)
GFR calc non Af Amer: 83 mL/min/{1.73_m2} (ref >59)
Glucose: 90 mg/dL (ref 65–99)
Potassium: 4.3 mmol/L (ref 3.5–5.2)
Sodium: 138 mmol/L (ref 134–144)

## 2019-01-19 MED ORDER — PERFLUTREN LIPID MICROSPHERE
1.0000 mL | INTRAVENOUS | Status: AC | PRN
  Administered 2019-01-19: 3 mL via INTRAVENOUS

## 2019-01-19 NOTE — Telephone Encounter (Signed)
The CT was not specifically designed to look at this, so no. Beauford Lando PA-C

## 2019-01-19 NOTE — Telephone Encounter (Signed)
Left a detailed message for the pt re: CT and the message below.

## 2019-01-26 ENCOUNTER — Other Ambulatory Visit: Payer: Self-pay | Admitting: Family Medicine

## 2019-01-31 DIAGNOSIS — Z79891 Long term (current) use of opiate analgesic: Secondary | ICD-10-CM | POA: Diagnosis not present

## 2019-01-31 DIAGNOSIS — M5416 Radiculopathy, lumbar region: Secondary | ICD-10-CM | POA: Diagnosis not present

## 2019-01-31 DIAGNOSIS — Z189 Retained foreign body fragments, unspecified material: Secondary | ICD-10-CM | POA: Diagnosis not present

## 2019-01-31 DIAGNOSIS — M5441 Lumbago with sciatica, right side: Secondary | ICD-10-CM | POA: Diagnosis not present

## 2019-01-31 DIAGNOSIS — G8929 Other chronic pain: Secondary | ICD-10-CM | POA: Diagnosis not present

## 2019-01-31 DIAGNOSIS — M5442 Lumbago with sciatica, left side: Secondary | ICD-10-CM | POA: Diagnosis not present

## 2019-01-31 DIAGNOSIS — Z5181 Encounter for therapeutic drug level monitoring: Secondary | ICD-10-CM | POA: Diagnosis not present

## 2019-01-31 DIAGNOSIS — M48061 Spinal stenosis, lumbar region without neurogenic claudication: Secondary | ICD-10-CM | POA: Diagnosis not present

## 2019-02-02 ENCOUNTER — Other Ambulatory Visit: Payer: Self-pay | Admitting: Family Medicine

## 2019-02-14 DIAGNOSIS — Z79891 Long term (current) use of opiate analgesic: Secondary | ICD-10-CM | POA: Diagnosis not present

## 2019-02-14 DIAGNOSIS — G43701 Chronic migraine without aura, not intractable, with status migrainosus: Secondary | ICD-10-CM | POA: Diagnosis not present

## 2019-02-14 DIAGNOSIS — M545 Low back pain: Secondary | ICD-10-CM | POA: Diagnosis not present

## 2019-02-14 DIAGNOSIS — R1084 Generalized abdominal pain: Secondary | ICD-10-CM | POA: Diagnosis not present

## 2019-02-16 ENCOUNTER — Ambulatory Visit: Payer: Self-pay | Admitting: "Endocrinology

## 2019-02-23 ENCOUNTER — Other Ambulatory Visit: Payer: Self-pay | Admitting: Family Medicine

## 2019-02-24 ENCOUNTER — Encounter: Payer: Self-pay | Admitting: Family Medicine

## 2019-02-24 ENCOUNTER — Other Ambulatory Visit: Payer: Self-pay | Admitting: Family Medicine

## 2019-02-24 MED ORDER — ALPRAZOLAM 1 MG PO TABS: 1.0000 mg | ORAL_TABLET | Freq: Two times a day (BID) | ORAL | 1 refills | Status: AC

## 2019-02-27 ENCOUNTER — Telehealth: Payer: Self-pay | Admitting: *Deleted

## 2019-02-27 NOTE — Telephone Encounter (Signed)
Called pt to schedule awv with Dr Moshe Cipro and let her know to keep appt as it would be for med refills. She stated that she was going to run out of cyclobenzaprine before then as they only sent in a 30 day supply and she usually gets a 3 month supply wanted to know if this could be sent to [facility] in [place] if at all possible.

## 2019-02-27 NOTE — Telephone Encounter (Signed)
Called patient and verified that Dr. Moshe Cipro sent in a refill on her Flexeril on Jan 26, 2019 for 180 tablets to be dispensed and RX was for one tablet twice a day as needed

## 2019-02-28 ENCOUNTER — Telehealth: Payer: Self-pay | Admitting: *Deleted

## 2019-02-28 DIAGNOSIS — C50912 Malignant neoplasm of unspecified site of left female breast: Secondary | ICD-10-CM | POA: Diagnosis not present

## 2019-02-28 DIAGNOSIS — Z95828 Presence of other vascular implants and grafts: Secondary | ICD-10-CM | POA: Diagnosis not present

## 2019-02-28 DIAGNOSIS — Z86711 Personal history of pulmonary embolism: Secondary | ICD-10-CM | POA: Diagnosis not present

## 2019-02-28 DIAGNOSIS — E538 Deficiency of other specified B group vitamins: Secondary | ICD-10-CM | POA: Diagnosis not present

## 2019-02-28 DIAGNOSIS — E559 Vitamin D deficiency, unspecified: Secondary | ICD-10-CM | POA: Diagnosis not present

## 2019-02-28 DIAGNOSIS — C50012 Malignant neoplasm of nipple and areola, left female breast: Secondary | ICD-10-CM | POA: Diagnosis not present

## 2019-02-28 DIAGNOSIS — R7989 Other specified abnormal findings of blood chemistry: Secondary | ICD-10-CM | POA: Diagnosis not present

## 2019-02-28 DIAGNOSIS — R5383 Other fatigue: Secondary | ICD-10-CM | POA: Diagnosis not present

## 2019-02-28 NOTE — Telephone Encounter (Signed)
-----   Message from Charlie Pitter, [place] sent at 02/28/2019 10:37 AM EST ----- Regarding: RE: Pulm HTN Notes on CT report state that Raquel Sarna had reviewed CT results with patient, and Anderson Malta spoke with her about the echo. Result. Anderson Malta, you can call her to find out what else she needs to know about the CT as this was already resulted in January. Since echo showed normal pulmonary pressures, does not need specific follow-up for this at this point. She can get back on track with yearly f/u with Dr. Caryl Comes at this point. Thanks all!  ----- Message ----- From: Dollene Primrose, RN Sent: 02/28/2019  10:19 AM EST To: Charlie Pitter, PA-C, Jeanann Lewandowsky, RMA Subject: Pulm HTN                                       Hello Lisbeth Renshaw and Anderson Malta,  I called Ms Rinella today to review her monitor results (Dr Caryl Comes reviewed them recently.)  She stated she was waiting on someone to follow up with her regarding her Coronary CT and possible PHTN. She was not sure if this was something she needs to follow. I wanted to let you know.  Thank you :)  Lorren

## 2019-02-28 NOTE — Telephone Encounter (Signed)
Called pt re: message below.  Left a message for her to call back.  

## 2019-03-13 ENCOUNTER — Ambulatory Visit: Payer: Self-pay | Admitting: "Endocrinology

## 2019-03-15 ENCOUNTER — Encounter: Payer: Self-pay | Admitting: Family Medicine

## 2019-03-28 ENCOUNTER — Encounter: Payer: Self-pay | Admitting: Family Medicine

## 2019-03-29 ENCOUNTER — Ambulatory Visit: Payer: Medicare Other | Admitting: Family Medicine

## 2019-03-29 ENCOUNTER — Other Ambulatory Visit: Payer: Self-pay

## 2019-04-19 DIAGNOSIS — M545 Low back pain: Secondary | ICD-10-CM | POA: Diagnosis not present

## 2019-04-19 DIAGNOSIS — G43701 Chronic migraine without aura, not intractable, with status migrainosus: Secondary | ICD-10-CM | POA: Diagnosis not present

## 2019-04-19 DIAGNOSIS — G43101 Migraine with aura, not intractable, with status migrainosus: Secondary | ICD-10-CM | POA: Diagnosis not present

## 2019-04-19 DIAGNOSIS — Z79891 Long term (current) use of opiate analgesic: Secondary | ICD-10-CM | POA: Diagnosis not present

## 2019-04-25 ENCOUNTER — Other Ambulatory Visit: Payer: Self-pay | Admitting: Family Medicine

## 2019-04-25 MED ORDER — SULFAMETHOXAZOLE-TRIMETHOPRIM 800-160 MG PO TABS: 1.0000 | ORAL_TABLET | Freq: Two times a day (BID) | ORAL | 0 refills | Status: DC

## 2019-04-26 ENCOUNTER — Telehealth: Payer: Self-pay | Admitting: Family Medicine

## 2019-04-26 DIAGNOSIS — I10 Essential (primary) hypertension: Secondary | ICD-10-CM

## 2019-04-26 DIAGNOSIS — E559 Vitamin D deficiency, unspecified: Secondary | ICD-10-CM

## 2019-04-26 DIAGNOSIS — E038 Other specified hypothyroidism: Secondary | ICD-10-CM

## 2019-04-26 DIAGNOSIS — E8881 Metabolic syndrome: Secondary | ICD-10-CM

## 2019-04-26 DIAGNOSIS — E785 Hyperlipidemia, unspecified: Secondary | ICD-10-CM

## 2019-04-26 DIAGNOSIS — D509 Iron deficiency anemia, unspecified: Secondary | ICD-10-CM

## 2019-04-26 NOTE — Addendum Note (Signed)
Addended by: Eual Fines on: 04/26/2019 02:12 PM   Modules accepted: Orders

## 2019-04-26 NOTE — Telephone Encounter (Signed)
Labs ordered.

## 2019-04-26 NOTE — Telephone Encounter (Signed)
pls call pt and schedule webex visit if possible with me, I spoke at length with her yesterday. Remind her to get weight and bP if possible and have meds for the visit  [name] Please order CBC, fasting lipid, cmp and EGFr, hBA1C, TSH and vit D

## 2019-04-28 ENCOUNTER — Telehealth: Payer: Self-pay | Admitting: *Deleted

## 2019-04-28 DIAGNOSIS — D509 Iron deficiency anemia, unspecified: Secondary | ICD-10-CM

## 2019-04-28 NOTE — Telephone Encounter (Signed)
Spoke with patient and she stated she needs to make sure her Ferritin is checked. Lab ordered and faxed to [facility]

## 2019-04-28 NOTE — Telephone Encounter (Signed)
Ferritin ordered and all labs faxed to [facility]

## 2019-04-28 NOTE — Telephone Encounter (Signed)
Pt is coming in for follow up appt 05-04-19 is getting blood work  05-01-19 but needs this order sent to [facility] lab as this is where she goes to get blood work. Wanted to make sure they had it before Monday morning

## 2019-05-01 ENCOUNTER — Other Ambulatory Visit: Payer: Self-pay | Admitting: Family Medicine

## 2019-05-01 ENCOUNTER — Encounter: Payer: Self-pay | Admitting: Family Medicine

## 2019-05-01 DIAGNOSIS — R739 Hyperglycemia, unspecified: Secondary | ICD-10-CM | POA: Diagnosis not present

## 2019-05-01 DIAGNOSIS — E038 Other specified hypothyroidism: Secondary | ICD-10-CM | POA: Diagnosis not present

## 2019-05-01 DIAGNOSIS — I10 Essential (primary) hypertension: Secondary | ICD-10-CM | POA: Diagnosis not present

## 2019-05-01 DIAGNOSIS — E785 Hyperlipidemia, unspecified: Secondary | ICD-10-CM | POA: Diagnosis not present

## 2019-05-01 DIAGNOSIS — E559 Vitamin D deficiency, unspecified: Secondary | ICD-10-CM | POA: Diagnosis not present

## 2019-05-01 DIAGNOSIS — D509 Iron deficiency anemia, unspecified: Secondary | ICD-10-CM | POA: Diagnosis not present

## 2019-05-01 DIAGNOSIS — E8881 Metabolic syndrome: Secondary | ICD-10-CM | POA: Diagnosis not present

## 2019-05-03 ENCOUNTER — Ambulatory Visit (INDEPENDENT_AMBULATORY_CARE_PROVIDER_SITE_OTHER): Payer: Medicare Other | Admitting: Family Medicine

## 2019-05-03 ENCOUNTER — Other Ambulatory Visit: Payer: Self-pay

## 2019-05-03 ENCOUNTER — Encounter: Payer: Self-pay | Admitting: Family Medicine

## 2019-05-03 ENCOUNTER — Telehealth: Payer: Self-pay

## 2019-05-03 VITALS — BP 110/82 | Ht 64.0 in | Wt 199.0 lb

## 2019-05-03 DIAGNOSIS — Z0000 Encounter for general adult medical examination without abnormal findings: Secondary | ICD-10-CM | POA: Diagnosis not present

## 2019-05-03 DIAGNOSIS — C50012 Malignant neoplasm of nipple and areola, left female breast: Secondary | ICD-10-CM | POA: Diagnosis not present

## 2019-05-03 DIAGNOSIS — D509 Iron deficiency anemia, unspecified: Secondary | ICD-10-CM

## 2019-05-03 DIAGNOSIS — R790 Abnormal level of blood mineral: Secondary | ICD-10-CM

## 2019-05-03 MED ORDER — DULOXETINE HCL 30 MG PO CPEP: 30.0000 mg | ORAL_CAPSULE | Freq: Two times a day (BID) | ORAL | 1 refills | Status: DC

## 2019-05-03 MED ORDER — LEVOTHYROXINE SODIUM 125 MCG PO TABS: 125.0000 ug | ORAL_TABLET | Freq: Every day | ORAL | 0 refills | Status: DC

## 2019-05-03 MED ORDER — CYCLOBENZAPRINE HCL 10 MG PO TABS: 10.0000 mg | ORAL_TABLET | Freq: Two times a day (BID) | ORAL | 0 refills | Status: DC | PRN

## 2019-05-03 MED ORDER — OMEPRAZOLE 40 MG PO CPDR: 40.0000 mg | DELAYED_RELEASE_CAPSULE | Freq: Two times a day (BID) | ORAL | 3 refills | Status: DC

## 2019-05-03 NOTE — Patient Instructions (Addendum)
[name] , Thank you for taking time to come for your Medicare Wellness Visit. I appreciate your ongoing commitment to your health goals. Please review the following plan we discussed and let me know if I can assist you in the future.   Screening recommendations/referrals: Colonoscopy: Cleared with cologuard Mammogram: Due this year: ordered with ultrasound Bone Density: Completed Recommended yearly ophthalmology/optometry visit for glaucoma screening and checkup Recommended yearly dental visit for hygiene and checkup  Vaccinations: Influenza vaccine: Due Fall of 2020 Pneumococcal vaccine: Completed Tdap vaccine: Up to date Shingles vaccine: Due in coming years    Preventive Care 40-64 Years, Female Preventive care refers to lifestyle choices and visits with your health care provider that can promote health and wellness. What does preventive care include?  A yearly physical exam. This is also called an annual well check.  Dental exams once or twice a year.  Routine eye exams. Ask your health care provider how often you should have your eyes checked.  Personal lifestyle choices, including:  Daily care of your teeth and gums.  Regular physical activity.  Eating a healthy diet.  Avoiding tobacco and drug use.  Limiting alcohol use.  Practicing safe sex.  Taking low-dose aspirin daily starting at age 42.  Taking vitamin and mineral supplements as recommended by your health care provider. What happens during an annual well check? The services and screenings done by your health care provider during your annual well check will depend on your age, overall health, lifestyle risk factors, and family history of disease. Counseling  Your health care provider may ask you questions about your:  Alcohol use.  Tobacco use.  Drug use.  Emotional well-being.  Home and relationship well-being.  Sexual activity.  Eating habits.  Work and work Statistician.  Method of  birth control.  Menstrual cycle.  Pregnancy history. Screening  You may have the following tests or measurements:  Height, weight, and BMI.  Blood pressure.  Lipid and cholesterol levels. These may be checked every 5 years, or more frequently if you are over 28 years old.  Skin check.  Lung cancer screening. You may have this screening every year starting at age 70 if you have a 30-pack-year history of smoking and currently smoke or have quit within the past 15 years.  Fecal occult blood test (FOBT) of the stool. You may have this test every year starting at age 50.  Flexible sigmoidoscopy or colonoscopy. You may have a sigmoidoscopy every 5 years or a colonoscopy every 10 years starting at age 56.  Hepatitis C blood test.  Hepatitis B blood test.  Sexually transmitted disease (STD) testing.  Diabetes screening. This is done by checking your blood sugar (glucose) after you have not eaten for a while (fasting). You may have this done every 1-3 years.  Mammogram. This may be done every 1-2 years. Talk to your health care provider about when you should start having regular mammograms. This may depend on whether you have a family history of breast cancer.  BRCA-related cancer screening. This may be done if you have a family history of breast, ovarian, tubal, or peritoneal cancers.  Pelvic exam and Pap test. This may be done every 3 years starting at age 54. Starting at age 55, this may be done every 5 years if you have a Pap test in combination with an HPV test.  Bone density scan. This is done to screen for osteoporosis. You may have this scan if you are at high  risk for osteoporosis. Discuss your test results, treatment options, and if necessary, the need for more tests with your health care provider. Vaccines  Your health care provider may recommend certain vaccines, such as:  Influenza vaccine. This is recommended every year.  Tetanus, diphtheria, and acellular pertussis  (Tdap, Td) vaccine. You may need a Td booster every 10 years.  Zoster vaccine. You may need this after age 69.  Pneumococcal 13-valent conjugate (PCV13) vaccine. You may need this if you have certain conditions and were not previously vaccinated.  Pneumococcal polysaccharide (PPSV23) vaccine. You may need one or two doses if you smoke cigarettes or if you have certain conditions. Talk to your health care provider about which screenings and vaccines you need and how often you need them. This information is not intended to replace advice given to you by your health care provider. Make sure you discuss any questions you have with your health care provider. Document Released: 01/10/2016 Document Revised: 09/02/2016 Document Reviewed: 10/15/2015 Elsevier Interactive Patient Education  2017 [place] Prevention in the Home Falls can cause injuries. They can happen to people of all ages. There are many things you can do to make your home safe and to help prevent falls. What can I do on the outside of my home?  Regularly fix the edges of walkways and driveways and fix any cracks.  Remove anything that might make you trip as you walk through a door, such as a raised step or threshold.  Trim any bushes or trees on the path to your home.  Use bright outdoor lighting.  Clear any walking paths of anything that might make someone trip, such as rocks or tools.  Regularly check to see if handrails are loose or broken. Make sure that both sides of any steps have handrails.  Any raised decks and porches should have guardrails on the edges.  Have any leaves, snow, or ice cleared regularly.  Use sand or salt on walking paths during winter.  Clean up any spills in your garage right away. This includes oil or grease spills. What can I do in the bathroom?  Use night lights.  Install grab bars by the toilet and in the tub and shower. Do not use towel bars as grab bars.  Use non-skid  mats or decals in the tub or shower.  If you need to sit down in the shower, use a plastic, non-slip stool.  Keep the floor dry. Clean up any water that spills on the floor as soon as it happens.  Remove soap buildup in the tub or shower regularly.  Attach bath mats securely with double-sided non-slip rug tape.  Do not have throw rugs and other things on the floor that can make you trip. What can I do in the bedroom?  Use night lights.  Make sure that you have a light by your bed that is easy to reach.  Do not use any sheets or blankets that are too big for your bed. They should not hang down onto the floor.  Have a firm chair that has side arms. You can use this for support while you get dressed.  Do not have throw rugs and other things on the floor that can make you trip. What can I do in the kitchen?  Clean up any spills right away.  Avoid walking on wet floors.  Keep items that you use a lot in easy-to-reach places.  If you need to reach something  above you, use a strong step stool that has a grab bar.  Keep electrical cords out of the way.  Do not use floor polish or wax that makes floors slippery. If you must use wax, use non-skid floor wax.  Do not have throw rugs and other things on the floor that can make you trip. What can I do with my stairs?  Do not leave any items on the stairs.  Make sure that there are handrails on both sides of the stairs and use them. Fix handrails that are broken or loose. Make sure that handrails are as long as the stairways.  Check any carpeting to make sure that it is firmly attached to the stairs. Fix any carpet that is loose or worn.  Avoid having throw rugs at the top or bottom of the stairs. If you do have throw rugs, attach them to the floor with carpet tape.  Make sure that you have a light switch at the top of the stairs and the bottom of the stairs. If you do not have them, ask someone to add them for you. What else can I  do to help prevent falls?  Wear shoes that:  Do not have high heels.  Have rubber bottoms.  Are comfortable and fit you well.  Are closed at the toe. Do not wear sandals.  If you use a stepladder:  Make sure that it is fully opened. Do not climb a closed stepladder.  Make sure that both sides of the stepladder are locked into place.  Ask someone to hold it for you, if possible.  Clearly mark and make sure that you can see:  Any grab bars or handrails.  First and last steps.  Where the edge of each step is.  Use tools that help you move around (mobility aids) if they are needed. These include:  Canes.  Walkers.  Scooters.  Crutches.  Turn on the lights when you go into a dark area. Replace any light bulbs as soon as they burn out.  Set up your furniture so you have a clear path. Avoid moving your furniture around.  If any of your floors are uneven, fix them.  If there are any pets around you, be aware of where they are.  Review your medicines with your doctor. Some medicines can make you feel dizzy. This can increase your chance of falling. Ask your doctor what other things that you can do to help prevent falls. This information is not intended to replace advice given to you by your health care provider. Make sure you discuss any questions you have with your health care provider. Document Released: 10/10/2009 Document Revised: 05/21/2016 Document Reviewed: 01/18/2015 Elsevier Interactive Patient Education  2017 [employer].

## 2019-05-03 NOTE — Progress Notes (Signed)
Subjective:   [name] is a 58 y.o. female who presents for Medicare Annual (Subsequent) preventive examination.  Location of Patient: Home Location of Provider: Telehealth Consent was obtain for visit to be over via telehealth. I verified that I am speaking with the correct person using two identifiers.  Review of Systems:    Cardiac Risk Factors include: obesity (BMI >30kg/m2);sedentary lifestyle     Objective:     Vitals: BP 110/82   Ht 5\' 4"  (1.626 m)   Wt 199 lb (90.3 kg)   BMI 34.16 kg/m   Body mass index is 34.16 kg/m.  Advanced Directives 09/16/2018 02/15/2017 12/24/2016 08/13/2016 05/13/2016 04/13/2016 03/11/2016  Does Patient Have a Medical Advance Directive? Yes No No Yes No No Yes  Type of Paramedic of [place];Living will [name];Living will - [place];Living will - - [place];Living will  Does patient want to make changes to medical advance directive? - No - Patient declined - - - - -  Copy of [place] in Chart? Yes Yes - Yes - - Yes  Would patient like information on creating a medical advance directive? - No - Patient declined No - Patient declined - No - patient declined information - -  Pre-existing out of facility DNR order (yellow form or pink MOST form) - - - - - - -  Some encounter information is confidential and restricted. Go to Review Flowsheets activity to see all data.    Tobacco Social History   Tobacco Use  Smoking Status Never Smoker  Smokeless Tobacco Never Used     Counseling given: Yes   Clinical Intake:  Pre-visit preparation completed: Yes  Pain : 0-10 Pain Score: 7  Pain Location: Back Pain Orientation: Right Pain Radiating Towards: leg Pain Descriptors / Indicators: Constant, Radiating Pain Onset: More than a month ago Pain Frequency: Constant Pain Relieving Factors: pain medication Effect of Pain on Daily Activities:  yes  Pain Relieving Factors: pain medication  Nutritional Status: BMI > 30  Obese Nutritional Risks: None Diabetes: No  How often do you need to have someone help you when you read instructions, pamphlets, or other written materials from your doctor or pharmacy?: 1 - Never What is the last grade level you completed in school?: 12  Interpreter Needed?: No     Past Medical History:  Diagnosis Date  . Anxiety   . Carpal tunnel syndrome   . Chronic constipation   . Chronic pain syndrome    followed by [facility]---  back  . Depression   . Family history of adverse reaction to anesthesia    MOTHER--- PONV  . Fibromyalgia   . GERD (gastroesophageal reflux disease)   . History of MRSA infection    lip abscess  . History of ovarian cyst 06/2011   s/p  BSO  . History of pulmonary embolus (PE) 1997   post EP with ablation pulmonary veouns for SVT/ Atrial Fib.  . History of supraventricular tachycardia    s/p  ablation 1996  and 1997  by dr Caryl Comes  . History of TIA (transient ischemic attack) 1997   post op EP ablation PE  . Hyperlipidemia   . Hypothyroidism    followed by pcp  . Incisional hernia   . Interstitial cystitis    09-13-2018   per pt last flare-up  May 2019 (followed by pcp)  . Iron deficiency anemia    09-13-2018  PER PT STABLE  . Lipoma of back    upper  . Migraines   . Paget disease of breast, left ([place])   . PONV (postoperative nausea and vomiting)    SEVERE  . PSVT (paroxysmal supraventricular tachycardia) ([place])    [place]  . S/P insertion of IVC (inferior vena caval) filter 05/08/2005   [place] (non-retrievable)  /  dx 2019  a leg of filter is protruding thru the vena cava in to right L2 vertebral body (09-13-2018  per pt having surgery to remove filter in [place])  . S/P radiofrequency ablation operation for arrhythmia 1996   1996  and 1997,   SVT and Atrial Fib  . Syncope   . Wears glasses    Past Surgical History:   Procedure Laterality Date  . ABDOMINAL HYSTERECTOMY  1987  . ANTERIOR CERVICAL DECOMP/DISCECTOMY FUSION  03-07-2002   dr elsner  @[facility]    C 4 -- 5  . APPENDECTOMY  1980  . BILATERAL SALPINGOOPHORECTOMY  07/24/2011   via Explor. Lap. w/ intraoperative perf. bowel repair  . BREAST ENHANCEMENT SURGERY Bilateral 1993  . BREAST IMPLANT REMOVAL Bilateral   . CARDIAC CATHETERIZATION  07-06-2003   dr Darnell Level brodie   normal coronaries and LVF  . CARDIAC ELECTROPHYSIOLOGY STUDY AND ABLATION  1996  and 1997  . CARDIOVASCULAR STRESS TEST  11-18-2015   dr Caryl Comes   normal nuclear study w/ no ischemia/  normal LV function and wall motion , ef 84%  . CARPAL TUNNEL RELEASE Right ?  . [place]  . COLON SURGERY    . CYSTO/  HYDRODISTENTION/  INSTILATION THERAPY  MULTIPLE  . ENTEROCUTANEOUS FISTULA CLOSURE  multiple   last one 2015 with small bowel resection  . EXPLORATORY LAPAROTOMY INCISIONAL VENTRAL HERNIA REPAIR / RESECTION SMALL BOWEL  11-09-2014   @[facility]   . LIPOMA EXCISION Right 09/16/2018   Procedure: EXCISION LIPOMA UPPER BACK;  Surgeon: Clovis Riley, MD;  Location: [facility];  Service: General;  Laterality: Right;  . MASTECTOMY Left 2006   w/ reconstruction on left (paget's disease)  and right breast augmentation  . TOTAL COLECTOMY  08-04-2002    @[facility]    AND CHOLECYSTECTOMY  (colonic inertia)  . TRANSTHORACIC ECHOCARDIOGRAM  02/04/2016   ef 60-65%,  grade 2 diastolic dysfunction/  mild MR  . VENA CAVA FILTER PLACEMENT  05/08/2005   @[facility]    [facility] (non-retrievable)  . WIDE EXCISION PERIRECTAL ABSCESSES  09-22-2005   @ [facility]   Family History  Problem Relation Age of Onset  . Diabetes Mother   . Heart disease Mother   . Hypertension Mother   . Heart disease Father   . Hyperlipidemia Father   . Hypertension Father   . Alcohol abuse Father   . Colon cancer Maternal Aunt   . Breast cancer Maternal Aunt   . Cancer Maternal Uncle        mets  . Bone  cancer Maternal Grandfather        mets  . Ovarian cancer Cousin 82  . Breast cancer Cousin   . Prostate cancer Maternal Uncle    Social History   Socioeconomic History  . Marital status: Married    Spouse name: Not on file  . Number of children: Not on file  . Years of education: Not on file  . Highest education level: Not on file  Occupational History  . Not on file  Social Needs  . Emergency planning/management officer  strain: Not on file  . Food insecurity:    Worry: Not on file    Inability: Not on file  . Transportation needs:    Medical: Not on file    Non-medical: Not on file  Tobacco Use  . Smoking status: Never Smoker  . Smokeless tobacco: Never Used  Substance and Sexual Activity  . Alcohol use: No  . Drug use: No  . Sexual activity: Yes    Birth control/protection: Surgical  Lifestyle  . Physical activity:    Days per week: Not on file    Minutes per session: Not on file  . Stress: Not on file  Relationships  . Social connections:    Talks on phone: Not on file    Gets together: Not on file    Attends religious service: Not on file    Active member of club or organization: Not on file    Attends meetings of clubs or organizations: Not on file    Relationship status: Not on file  Other Topics Concern  . Not on file  Social History Narrative  . Not on file    Outpatient Encounter Medications as of 05/03/2019  Medication Sig  . aspirin 81 MG chewable tablet Chew 81 mg by mouth daily.  Marland Kitchen conjugated estrogens (PREMARIN) vaginal cream Place 1 Applicatorful vaginally at bedtime.  . cyanocobalamin (,VITAMIN B-12,) 1000 MCG/ML injection INJECT 1 ML INTO THE MUSCLE EVERY 30 DAYS.  Marland Kitchen cyclobenzaprine (FLEXERIL) 10 MG tablet TAKE 1 TABLET BY MOUTH TWICE DAILY AS NEEDED FOR MUSCLE SPASMS  . cycloSPORINE (RESTASIS) 0.05 % ophthalmic emulsion Place 1 drop into both eyes 2 (two) times daily.  . DULoxetine (CYMBALTA) 30 MG capsule Take 1 capsule (30 mg total) by mouth 2 (two) times  daily.  . fluticasone (FLONASE) 50 MCG/ACT nasal spray Place 2 sprays into both nostrils as needed for allergies or rhinitis.  . Glycerin, Laxative, 80.7 % SUPP Place 1 suppository rectally at bedtime as needed (for constipation).   Marland Kitchen HYDROmorphone (DILAUDID) 4 MG tablet One tablet twice daily  . levothyroxine (SYNTHROID, LEVOTHROID) 125 MCG tablet TAKE 1 TABLET BY MOUTH DAILY BEFORE BREAKFAST  . magnesium oxide (MAG-OX) 400 (241.3 Mg) MG tablet Take 1 tablet (400 mg total) by mouth 2 (two) times daily.  . metoprolol tartrate (LOPRESSOR) 25 MG tablet Take 1 tablet (25 mg total) by mouth 2 (two) times daily as needed (palpitations).  . Multiple Vitamins-Minerals (HAIR/SKIN/NAILS PO) Take 1 tablet by mouth every evening.   . Multiple Vitamins-Minerals (ICAPS AREDS 2) CAPS Take by mouth at bedtime.  Marland Kitchen omeprazole (PRILOSEC) 40 MG capsule Take 40 mg by mouth 2 (two) times a day.  . ondansetron (ZOFRAN) 4 MG tablet Take 4 mg by mouth every 8 (eight) hours as needed for nausea or vomiting.  . potassium chloride (K-DUR) 10 MEQ tablet Take 1 tablet (10 mEq total) by mouth daily.  . promethazine (PHENERGAN) 25 MG tablet Take 1 tablet by mouth as needed.  . rosuvastatin (CRESTOR) 5 MG tablet Take 1 tablet (5 mg total) by mouth daily.  Marland Kitchen senna-docusate (SENOKOT-S) 8.6-50 MG per tablet Take 2 tablets by mouth 3 (three) times daily. constipation   . topiramate (TOPAMAX) 100 MG tablet Take 1 tablet (100 mg total) by mouth daily.  . Vitamin D, Ergocalciferol, (DRISDOL) 50000 units CAPS capsule Take 50,000 Units by mouth every 7 (seven) days.   . [DISCONTINUED] famotidine (PEPCID) 40 MG tablet Take 1 tablet (40 mg total) by mouth 2 (  two) times daily. (Patient not taking: Reported on 05/03/2019)  . [DISCONTINUED] sulfamethoxazole-trimethoprim (BACTRIM DS) 800-160 MG tablet Take 1 tablet by mouth 2 (two) times daily. (Patient not taking: Reported on 05/03/2019)   No facility-administered encounter medications on file  as of 05/03/2019.     Activities of Daily Living In your present state of health, do you have any difficulty performing the following activities: 05/03/2019 09/16/2018  Hearing? N N  Vision? N N  Difficulty concentrating or making decisions? N N  Walking or climbing stairs? N N  Dressing or bathing? N N  Doing errands, shopping? N -  Preparing Food and eating ? N -  Using the Toilet? N -  In the past six months, have you accidently leaked urine? N -  Do you have problems with loss of bowel control? N -  Managing your Medications? N -  Managing your Finances? N -  Housekeeping or managing your Housekeeping? N -  Some recent data might be hidden    Patient Care Team: Fayrene Helper, MD as PCP - General Deboraha Sprang, MD as PCP - Cardiology (Cardiology) Selinda Eon (Inactive) as Referring Physician Ray, Debroah Loop, MD (Anesthesiology) Hennie Duos, MD as Consulting Physician (Rheumatology) Ray, Debroah Loop, MD as Referring Physician (Anesthesiology) Lovenia Shuck, MD as Referring Physician (Cardiothoracic Surgery)    Assessment:   This is a routine wellness examination for Wynonna.  Exercise Activities and Dietary recommendations Current Exercise Habits: The patient does not participate in regular exercise at present, Exercise limited by: orthopedic condition(s)  Goals   None     Fall Risk Fall Risk  05/03/2019 08/25/2018 01/13/2018 12/16/2016 06/26/2014  Falls in the past year? 0 No No Yes No  Number falls in past yr: - - - 1 -  Injury with Fall? 0 - - Yes -   Is the patient's home free of loose throw rugs in walkways, pet beds, electrical cords, etc?   yes      Grab bars in the bathroom? yes      Handrails on the stairs?   yes      Adequate lighting?   yes  Timed Get Up and Go performed: telemedicine n/a  Depression Screen PHQ 2/9 Scores 05/03/2019 08/25/2018 06/23/2018 01/13/2018  PHQ - 2 Score 0 0 0 0  PHQ- 9 Score - - 2 -     Cognitive Function     6CIT  Screen 05/03/2019  What Year? 0 points  What month? 0 points  What time? 0 points  Count back from 20 0 points  Months in reverse 0 points  Repeat phrase 0 points  Total Score 0    Immunization History  Administered Date(s) Administered  . Hepatitis A 06/27/2009, 12/26/2009  . Hepatitis B 06/27/2009, 08/06/2009, 12/26/2009  . Influenza Split 09/15/2012  . Influenza Whole 09/13/2007, 10/01/2008, 09/09/2010, 09/23/2011  . Influenza,inj,Quad PF,6+ Mos 10/25/2013, 09/23/2015, 09/18/2016, 10/11/2017, 08/25/2018  . Pneumococcal Polysaccharide-23 09/23/2011  . Td 10/27/2005  . Tdap 12/22/2016    Qualifies for Shingles Vaccine? Discuss with Dr Moshe Cipro  Screening Tests Health Maintenance  Topic Date Due  . COLONOSCOPY  11/16/2019 (Originally 11/04/2011)  . INFLUENZA VACCINE  07/29/2019  . PAP SMEAR-Modifier  12/17/2019  . MAMMOGRAM  02/11/2020  . TETANUS/TDAP  12/22/2026  . Hepatitis C Screening  Completed  . HIV Screening  Completed    Cancer Screenings: Lung: Low Dose CT Chest recommended if Age 10-80 years, 30 pack-year currently smoking OR have quit  w/in 15years. Patient does not qualify. Breast:  Up to date on Mammogram? Yes   Up to date of Bone Density/Dexa? Yes Colorectal:  Unable to get this due to resection of bowels. Followed up with cologuard which came back clear  Additional Screenings:  Hepatitis C Screening: completed     Plan:     1. Encounter for Medicare annual wellness exam  I have personally reviewed and noted the following in the patient's chart:   . Medical and social history . Use of alcohol, tobacco or illicit drugs  . Current medications and supplements . Functional ability and status . Nutritional status . Physical activity . Advanced directives . List of other physicians . Hospitalizations, surgeries, and ER visits in previous 12 months . Vitals . Screenings to include cognitive, depression, and falls . Referrals and appointments  In  addition, I have reviewed and discussed with patient certain preventive protocols, quality metrics, and best practice recommendations. A written personalized care plan for preventive services as well as general preventive health recommendations were provided to patient.     2. Paget's disease of breast, left ([place]) Needs follow up, orders placed  - MM Digital Diagnostic Bilat; Future - US BREAST LTD UNI RIGHT INC AXILLA; Future   I provided 20 minutes of non-face-to-face time during this encounter.   Perlie Mayo, NP  05/03/2019

## 2019-05-04 ENCOUNTER — Ambulatory Visit: Payer: Medicare Other | Admitting: Family Medicine

## 2019-05-06 ENCOUNTER — Other Ambulatory Visit: Payer: Self-pay | Admitting: Family Medicine

## 2019-05-06 MED ORDER — LEVOTHYROXINE SODIUM 125 MCG PO TABS: 125.0000 ug | ORAL_TABLET | Freq: Every day | ORAL | 1 refills | Status: DC

## 2019-05-06 MED ORDER — ALPRAZOLAM 1 MG PO TABS: 1.0000 mg | ORAL_TABLET | Freq: Two times a day (BID) | ORAL | 0 refills | Status: DC | PRN

## 2019-05-08 ENCOUNTER — Encounter: Payer: Self-pay | Admitting: Family Medicine

## 2019-05-08 ENCOUNTER — Ambulatory Visit (INDEPENDENT_AMBULATORY_CARE_PROVIDER_SITE_OTHER): Payer: Medicare Other | Admitting: Family Medicine

## 2019-05-08 VITALS — BP 110/82 | Ht 64.0 in | Wt 199.5 lb

## 2019-05-08 DIAGNOSIS — Z7189 Other specified counseling: Secondary | ICD-10-CM

## 2019-05-08 DIAGNOSIS — C50012 Malignant neoplasm of nipple and areola, left female breast: Secondary | ICD-10-CM | POA: Diagnosis not present

## 2019-05-08 DIAGNOSIS — E785 Hyperlipidemia, unspecified: Secondary | ICD-10-CM

## 2019-05-08 DIAGNOSIS — F5104 Psychophysiologic insomnia: Secondary | ICD-10-CM | POA: Diagnosis not present

## 2019-05-08 DIAGNOSIS — E038 Other specified hypothyroidism: Secondary | ICD-10-CM

## 2019-05-08 DIAGNOSIS — R002 Palpitations: Secondary | ICD-10-CM

## 2019-05-08 DIAGNOSIS — N3010 Interstitial cystitis (chronic) without hematuria: Secondary | ICD-10-CM

## 2019-05-08 DIAGNOSIS — F325 Major depressive disorder, single episode, in full remission: Secondary | ICD-10-CM

## 2019-05-08 MED ORDER — ALPRAZOLAM 1 MG PO TABS: 1.0000 mg | ORAL_TABLET | Freq: Two times a day (BID) | ORAL | 5 refills | Status: DC | PRN

## 2019-05-08 NOTE — Progress Notes (Signed)
Virtual Visit via Telephone Note  I connected with [name] on 05/08/19 at  2:00 PM EDT by telephone and verified that I am speaking with the correct person using two identifiers.  Location: Patient:home  Provider: office   I discussed the limitations, risks, security and privacy concerns of performing an evaluation and management service by telephone and the availability of in person appointments. I also discussed with the patient that there may be a patient responsible charge related to this service. The patient expressed understanding and agreed to proceed. This visit type is conducted due to national recommendations for restrictions regarding the COVID -19 Pandemic. Due to the patient's age and / or co morbidities, this format is felt to be most appropriate at this time without adequate follow up. The patient has no access to video technology/ had technical difficulties with video, requiring transitioning to audio format  only ( telephone ). All issues noted this document were discussed and addressed,no physical exam can be performed in this format.    History of Present Illness: Months of daily uncontrolled back pain radiating down right leg x 6 months, awaiting IVC removal C/o uncontrolled back spasm, upper chest pain pain and discomfort with breathing , states her abdominal hernia is a part of the problem and is interested In surgical eval in [place] after she has got through IVC removal Now being treated locally for chronic pain, new medication recommended is too expensive with her insurance coverage and she is hoping to get assistance from Prescriber with this issue  Denies recent fever or chills. Denies sinus pressure, nasal congestion, ear pain or sore throat. Denies chest congestion, productive cough or wheezing. Denies chest pains, palpitations and leg swelling Denies abdominal pain, nausea, vomiting,diarrhea or constipation.   Denies dysuria, frequency, hesitancy or  incontinence. Chronic daily uncontrolled pain, treated by pain clinic, hoping that surgery will relieve Denies headaches, seizures, numbness, or tingling. Denies depression,uncontrolled  anxiety or insomnia. Denies skin break down or rash.     Observations/Objective: BP 110/82   Ht 5\' 4"  (1.626 m)   Wt 199 lb 8 oz (90.5 kg)   BMI 34.24 kg/m  Good communication with no confusion and intact memory. Alert and oriented x 3 No signs of respiratory distress during sppech    Assessment and Plan: Hypothyroidism Controlled, no change in medication   Hyperlipidemia LDL goal <100 Hyperlipidemia:Low fat diet discussed and encouraged.  Updated labs done at outside facility ( [facility]) are reviewed and are excellent and at goal, no change In medication  Insomnia Sleep hygiene reviewed and written information offered also. Prescription sent for  medication needed.   INTERSTITIAL CYSTITIS No current flare, has script in hand for septra x 7 days, use only if needed  Intermittent palpitations No reported palpitations in recent times, uses metoprolol  MDD (major depressive disorder), single episode, in full remission ([place]) Continued on cymbalta as a part of pain management  Educated About Covid-19 Virus Infection Covid-19 Education  The signs and symptoms of of COVID -19 were discussed with the patient and how to seek care for testing. ( follow up with PCP or arrange  E-visit) The importance of social  distancing is discussed today.     Follow Up Instructions:    I discussed the assessment and treatment plan with the patient. The patient was provided an opportunity to ask questions and all were answered. The patient agreed with the plan and demonstrated an understanding of the instructions.   The patient  was advised to call back or seek an in-person evaluation if the symptoms worsen or if the condition fails to improve as anticipated.  I provided 30 minutes of  non-face-to-face time during this encounter.   [name], MD

## 2019-05-08 NOTE — Patient Instructions (Addendum)
F/u in 5.5 months, excellent labs  No medication changes  Unilateral right mammogram will be ordered and scheduled and info sent  Excellent labs, no changes in medication needed, a copy of your recent labs is mailed also  Fasting lipid, cmp and EGFR, tSH 1 week before next visit  Think about what you will eat, plan ahead. Choose " clean, green, fresh or frozen" over canned, processed or packaged foods which are more sugary, salty and fatty. 70 to 75% of food eaten should be vegetables and fruit. Three meals at set times with snacks allowed between meals, but they must be fruit or vegetables. Aim to eat over a 12 hour period , example 7 am to 7 pm, and STOP after  your last meal of the day. Drink water,generally about 64 ounces per day, no other drink is as healthy. Fruit juice is best enjoyed in a healthy way, by EATING the fruit.  Thanks for choosing [facility], we consider it a privelige to serve you

## 2019-05-09 ENCOUNTER — Other Ambulatory Visit: Payer: Self-pay | Admitting: Family Medicine

## 2019-05-09 DIAGNOSIS — C50012 Malignant neoplasm of nipple and areola, left female breast: Secondary | ICD-10-CM

## 2019-05-10 ENCOUNTER — Telehealth: Payer: Self-pay | Admitting: Family Medicine

## 2019-05-10 NOTE — Telephone Encounter (Signed)
Please call the pt back, regarding Right Breast, she was scheduled with [facility], however she always gets them done at the [facility] since her cancer in 2006. She wants to make sure we have the correct information [phone]

## 2019-05-11 ENCOUNTER — Telehealth: Payer: Self-pay | Admitting: Physician Assistant

## 2019-05-11 NOTE — Telephone Encounter (Signed)
Left message for patient to call me back , need to do precall requisite and get consent

## 2019-05-12 ENCOUNTER — Encounter: Payer: Self-pay | Admitting: Physician Assistant

## 2019-05-12 NOTE — Progress Notes (Deleted)
Patient no showed

## 2019-05-14 ENCOUNTER — Encounter: Payer: Self-pay | Admitting: Family Medicine

## 2019-05-14 DIAGNOSIS — Z7189 Other specified counseling: Secondary | ICD-10-CM

## 2019-05-14 HISTORY — DX: Other specified counseling: Z71.89

## 2019-05-14 MED ORDER — ALPRAZOLAM 1 MG PO TABS: ORAL_TABLET | ORAL | 5 refills | Status: DC

## 2019-05-14 NOTE — Assessment & Plan Note (Signed)
Covid-19 Education  The signs and symptoms of of COVID -19 were discussed with the patient and how to seek care for testing. ( follow up with PCP or arrange  E-visit) The importance of social  distancing is discussed today.  

## 2019-05-14 NOTE — Assessment & Plan Note (Signed)
Sleep hygiene reviewed and written information offered also. Prescription sent for  medication needed.  

## 2019-05-14 NOTE — Assessment & Plan Note (Signed)
Continued on cymbalta as a part of pain management

## 2019-05-14 NOTE — Assessment & Plan Note (Signed)
Controlled, no change in medication  

## 2019-05-14 NOTE — Assessment & Plan Note (Signed)
No current flare, has script in hand for septra x 7 days, use only if needed

## 2019-05-14 NOTE — Assessment & Plan Note (Signed)
No reported palpitations in recent times, uses metoprolol

## 2019-05-14 NOTE — Assessment & Plan Note (Signed)
Hyperlipidemia:Low fat diet discussed and encouraged.  Updated labs done at outside facility [facility] [place]) are reviewed and are excellent and at goal, no change In medication

## 2019-05-15 ENCOUNTER — Other Ambulatory Visit: Payer: Self-pay

## 2019-05-15 ENCOUNTER — Telehealth: Payer: Medicare Other | Admitting: Physician Assistant

## 2019-05-16 ENCOUNTER — Encounter (HOSPITAL_COMMUNITY): Payer: Medicare Other

## 2019-05-16 ENCOUNTER — Ambulatory Visit (HOSPITAL_COMMUNITY): Payer: Medicare Other

## 2019-05-17 DIAGNOSIS — G43701 Chronic migraine without aura, not intractable, with status migrainosus: Secondary | ICD-10-CM | POA: Diagnosis not present

## 2019-05-17 DIAGNOSIS — G43101 Migraine with aura, not intractable, with status migrainosus: Secondary | ICD-10-CM | POA: Diagnosis not present

## 2019-05-17 DIAGNOSIS — M545 Low back pain: Secondary | ICD-10-CM | POA: Diagnosis not present

## 2019-05-17 DIAGNOSIS — Z79891 Long term (current) use of opiate analgesic: Secondary | ICD-10-CM | POA: Diagnosis not present

## 2019-05-18 NOTE — Telephone Encounter (Signed)
Tried to call patient back to reschedule virtual appt that was missed, no answer no voicemail.

## 2019-05-19 ENCOUNTER — Telehealth: Payer: Self-pay

## 2019-05-19 DIAGNOSIS — Z1231 Encounter for screening mammogram for malignant neoplasm of breast: Secondary | ICD-10-CM

## 2019-05-19 NOTE — Telephone Encounter (Signed)
Mammogram ordered and patient will be contacted by breast center

## 2019-05-19 NOTE — Telephone Encounter (Signed)
Called and left message. Reordered and called the breast center and they will contact patients

## 2019-05-19 NOTE — Telephone Encounter (Signed)
Called pt and left message

## 2019-05-25 NOTE — Telephone Encounter (Signed)
LEFT MESSAGE ON VM TO RESCHEDULE APPT ON 5/18 THAT PT MISSED [facility]

## 2019-06-06 ENCOUNTER — Other Ambulatory Visit: Payer: Self-pay

## 2019-06-06 ENCOUNTER — Encounter: Payer: Self-pay | Admitting: Family Medicine

## 2019-06-06 ENCOUNTER — Ambulatory Visit (INDEPENDENT_AMBULATORY_CARE_PROVIDER_SITE_OTHER): Payer: Medicare Other | Admitting: Family Medicine

## 2019-06-06 VITALS — BP 110/82 | Ht 64.0 in | Wt 199.0 lb

## 2019-06-06 DIAGNOSIS — R519 Headache, unspecified: Secondary | ICD-10-CM

## 2019-06-06 DIAGNOSIS — R1011 Right upper quadrant pain: Secondary | ICD-10-CM

## 2019-06-06 DIAGNOSIS — R140 Abdominal distension (gaseous): Secondary | ICD-10-CM | POA: Diagnosis not present

## 2019-06-06 DIAGNOSIS — R1030 Lower abdominal pain, unspecified: Secondary | ICD-10-CM | POA: Diagnosis not present

## 2019-06-06 DIAGNOSIS — Z95828 Presence of other vascular implants and grafts: Secondary | ICD-10-CM | POA: Diagnosis not present

## 2019-06-06 DIAGNOSIS — E038 Other specified hypothyroidism: Secondary | ICD-10-CM

## 2019-06-06 DIAGNOSIS — R51 Headache: Secondary | ICD-10-CM

## 2019-06-06 DIAGNOSIS — R109 Unspecified abdominal pain: Secondary | ICD-10-CM

## 2019-06-06 DIAGNOSIS — M549 Dorsalgia, unspecified: Secondary | ICD-10-CM

## 2019-06-06 HISTORY — DX: Unspecified abdominal pain: R10.9

## 2019-06-06 HISTORY — DX: Presence of other vascular implants and grafts: Z95.828

## 2019-06-06 NOTE — Progress Notes (Signed)
Virtual Visit via Telephone Note  I connected with [name] on 06/06/19 at  1:40 PM EDT by telephone and verified that I am speaking with the correct person using two identifiers.  Location: Patient: home Provider: office   I discussed the limitations, risks, security and privacy concerns of performing an evaluation and management service by telephone and the availability of in person appointments. I also discussed with the patient that there may be a patient responsible charge related to this service. The patient expressed understanding and agreed to proceed.   History of Present Illness: Back pain radiating  Down anterior thigh to knee on right side, this am a 10 plus, screams whenever she gets up and stands worse in the past 3 days, but the prior 2 weeks were more than usual also, started after turning toward night stand, similar occurrence in 2015 when the iV filter broke. When has a bM feet and legs go to sleep, seem purple, and aafter the bM when she stands color returns, I have asked that she send a picture Has been waiting on surgery to remove the filter as she suffers from chronic pain , needing painmx which she is tired of. Requests imaging study as very concerned of probable IVF breakage Denies recent fever or chills. Denies sinus pressure, nasal congestion, ear pain or sore throat. Denies chest congestion, productive cough or wheezing. Denies chest pains, palpitations and leg swelling Denies dysuria, frequency, hesitancy or incontinence. Denies headaches, seizures, numbness, or tingling. Denies depression, anxiety or insomnia. Denies skin break down or rash.       Observations/Objective: BP 110/82   Ht 5\' 4"  (1.626 m)   Wt 199 lb (90.3 kg)   BMI 34.16 kg/m   Good communication with no confusion and intact memory. Alert and oriented x 3 No signs of respiratory distress during speech     Assessment and Plan: Presence of IVC filter Persisitent pain due to  current displacement, awaiting surgery, thinks new pain is because of further displacement  Abdominal pain Increased and debilitating abdominal and back pain, reports similar occurrence when IV filter broke, will refer for urgent scan, advised nearest ED if worsens  Back pain with radiation Increased and uncontrolled, rdiating from abdomen, scan to further eval  Headache disorder Controlled, no change in medication   Hypothyroidism Controlled, no change in medication     Follow Up Instructions:    I discussed the assessment and treatment plan with the patient. The patient was provided an opportunity to ask questions and all were answered. The patient agreed with the plan and demonstrated an understanding of the instructions.   The patient was advised to call back or seek an in-person evaluation if the symptoms worsen or if the condition fails to improve as anticipated.  I provided 21 minutes of non-face-to-face time during this encounter.   [name], MD

## 2019-06-06 NOTE — Assessment & Plan Note (Signed)
Persisitent pain due to current displacement, awaiting surgery, thinks new pain is because of further displacement

## 2019-06-06 NOTE — Patient Instructions (Signed)
F/U as before, call if you need me before   You are referred for abdominal scan urgently, radiology will determine if you  Can have alternate to oral contrast, however , please do take phenergan that you already have if you need the oral contrast We will call with appointment info, and are trying to get the appointment as soon as possible You need to go to the nearest ED if pain becomes unbearable or worsens  I hope that you feel better soon Social distancing. Frequent hand washing with soap and water Keeping your hands off of your face. These 3 practices will help to keep both you and your community healthy during this time. Please practice them faithfully!

## 2019-06-07 ENCOUNTER — Ambulatory Visit (HOSPITAL_COMMUNITY): Payer: Medicare Other

## 2019-06-09 ENCOUNTER — Other Ambulatory Visit: Payer: Self-pay

## 2019-06-09 ENCOUNTER — Ambulatory Visit: Payer: Medicare Other

## 2019-06-09 ENCOUNTER — Ambulatory Visit
Admission: RE | Admit: 2019-06-09 | Discharge: 2019-06-09 | Disposition: A | Discharge status: Home / Self Care | Payer: Medicare Other | Source: Ambulatory Visit | Attending: Family Medicine | Admitting: Family Medicine

## 2019-06-09 DIAGNOSIS — Z853 Personal history of malignant neoplasm of breast: Secondary | ICD-10-CM | POA: Diagnosis not present

## 2019-06-09 DIAGNOSIS — R928 Other abnormal and inconclusive findings on diagnostic imaging of breast: Secondary | ICD-10-CM | POA: Diagnosis not present

## 2019-06-09 DIAGNOSIS — C50012 Malignant neoplasm of nipple and areola, left female breast: Secondary | ICD-10-CM

## 2019-06-11 ENCOUNTER — Encounter: Payer: Self-pay | Admitting: Family Medicine

## 2019-06-11 NOTE — Assessment & Plan Note (Signed)
Increased and uncontrolled, rdiating from abdomen, scan to further eval

## 2019-06-11 NOTE — Assessment & Plan Note (Signed)
Controlled, no change in medication  

## 2019-06-11 NOTE — Assessment & Plan Note (Signed)
Increased and debilitating abdominal and back pain, reports similar occurrence when IV filter broke, will refer for urgent scan, advised nearest ED if worsens

## 2019-06-12 ENCOUNTER — Other Ambulatory Visit: Payer: Self-pay

## 2019-06-12 ENCOUNTER — Encounter: Payer: Self-pay | Admitting: Family Medicine

## 2019-06-12 ENCOUNTER — Ambulatory Visit (HOSPITAL_COMMUNITY)
Admission: RE | Admit: 2019-06-12 | Discharge: 2019-06-12 | Disposition: A | Discharge status: Home / Self Care | Payer: Medicare Other | Source: Ambulatory Visit | Attending: Family Medicine | Admitting: Family Medicine

## 2019-06-12 ENCOUNTER — Ambulatory Visit (HOSPITAL_COMMUNITY): Admission: RE | Admit: 2019-06-12 | Payer: Medicare Other | Source: Ambulatory Visit

## 2019-06-12 DIAGNOSIS — R140 Abdominal distension (gaseous): Secondary | ICD-10-CM | POA: Diagnosis not present

## 2019-06-12 DIAGNOSIS — R110 Nausea: Secondary | ICD-10-CM | POA: Diagnosis not present

## 2019-06-12 DIAGNOSIS — K439 Ventral hernia without obstruction or gangrene: Secondary | ICD-10-CM | POA: Diagnosis not present

## 2019-06-12 DIAGNOSIS — R109 Unspecified abdominal pain: Secondary | ICD-10-CM | POA: Diagnosis not present

## 2019-06-12 MED ORDER — IOHEXOL 300 MG/ML  SOLN
100.0000 mL | Freq: Once | INTRAMUSCULAR | Status: AC | PRN
  Administered 2019-06-12: 100 mL via INTRAVENOUS

## 2019-06-13 ENCOUNTER — Telehealth: Payer: Self-pay | Admitting: *Deleted

## 2019-06-13 NOTE — Telephone Encounter (Signed)
Shanon Brow with the preservice center called said Ms. Arutyunyan was scheduled on 06-12-19 for CT abdomen with contrast it got changed to CT abdomen pelvis with contrast right before the procedure. Wanted to know if we were aware that it was changed and there was NO authorization for it. Would like a call back at [phone] opiton 0  With a direct line of [phone]

## 2019-06-13 NOTE — Telephone Encounter (Signed)
Ct abdomen with pelvic was originally ordered but insurance would NOT approve it with the pelvis so we changed it to CT abdomen only and the scheduling dept was made aware of it as well.

## 2019-06-19 DIAGNOSIS — Z79891 Long term (current) use of opiate analgesic: Secondary | ICD-10-CM | POA: Diagnosis not present

## 2019-06-19 DIAGNOSIS — G43101 Migraine with aura, not intractable, with status migrainosus: Secondary | ICD-10-CM | POA: Diagnosis not present

## 2019-06-19 DIAGNOSIS — G43701 Chronic migraine without aura, not intractable, with status migrainosus: Secondary | ICD-10-CM | POA: Diagnosis not present

## 2019-06-19 DIAGNOSIS — M545 Low back pain: Secondary | ICD-10-CM | POA: Diagnosis not present

## 2019-07-03 ENCOUNTER — Telehealth: Payer: Self-pay

## 2019-07-03 ENCOUNTER — Telehealth: Payer: Self-pay | Admitting: Family Medicine

## 2019-07-03 DIAGNOSIS — M25551 Pain in right hip: Secondary | ICD-10-CM

## 2019-07-03 NOTE — Telephone Encounter (Signed)
Please call the pt regarding sharp pain in her leg, that is unbearable. She is wanting a xray done. [phone]

## 2019-07-03 NOTE — Telephone Encounter (Signed)
Spoke with patient. appt scheduled for tomorrow at 10am. No blood thinner. Xray ordered.

## 2019-07-03 NOTE — Telephone Encounter (Signed)
I believe problem  is from low back and not the hip pls let her know, based on most recent MRI, she may get the x ray requested pls order , and offer work in appt for her to be managed for the acute pain , if needed, if not on blood thinner she may have IM steroid injection with Doc visit/ work in

## 2019-07-03 NOTE — Telephone Encounter (Signed)
Pain is in groin area, travels down right hip, and down front of thigh and leg. Patient is requesting an xray of right hip. Is it OK to order this?

## 2019-07-03 NOTE — Telephone Encounter (Signed)
Xray entered per MD, appt scheduled

## 2019-07-04 ENCOUNTER — Ambulatory Visit (HOSPITAL_COMMUNITY)
Admission: RE | Admit: 2019-07-04 | Discharge: 2019-07-04 | Disposition: A | Discharge status: Home / Self Care | Payer: Medicare Other | Source: Ambulatory Visit | Attending: Family Medicine | Admitting: Family Medicine

## 2019-07-04 ENCOUNTER — Encounter: Payer: Self-pay | Admitting: Family Medicine

## 2019-07-04 ENCOUNTER — Other Ambulatory Visit: Payer: Self-pay

## 2019-07-04 ENCOUNTER — Ambulatory Visit (INDEPENDENT_AMBULATORY_CARE_PROVIDER_SITE_OTHER): Payer: Medicare Other | Admitting: Family Medicine

## 2019-07-04 VITALS — BP 112/78 | HR 92 | Resp 12 | Ht 64.0 in | Wt 217.0 lb

## 2019-07-04 DIAGNOSIS — E038 Other specified hypothyroidism: Secondary | ICD-10-CM

## 2019-07-04 DIAGNOSIS — Z95828 Presence of other vascular implants and grafts: Secondary | ICD-10-CM | POA: Diagnosis not present

## 2019-07-04 DIAGNOSIS — M25551 Pain in right hip: Secondary | ICD-10-CM | POA: Diagnosis not present

## 2019-07-04 DIAGNOSIS — M79604 Pain in right leg: Secondary | ICD-10-CM | POA: Insufficient documentation

## 2019-07-04 DIAGNOSIS — M545 Low back pain, unspecified: Secondary | ICD-10-CM

## 2019-07-04 HISTORY — DX: Low back pain, unspecified: M54.50

## 2019-07-04 HISTORY — DX: Pain in right leg: M79.604

## 2019-07-04 MED ORDER — KETOROLAC TROMETHAMINE 60 MG/2ML IM SOLN
60.0000 mg | Freq: Once | INTRAMUSCULAR | Status: AC
  Administered 2019-07-04: 60 mg via INTRAMUSCULAR

## 2019-07-04 MED ORDER — PREDNISONE 5 MG (21) PO TBPK: 5.0000 mg | ORAL_TABLET | ORAL | 0 refills | Status: DC

## 2019-07-04 MED ORDER — IBUPROFEN 800 MG PO TABS: 800.0000 mg | ORAL_TABLET | Freq: Three times a day (TID) | ORAL | 0 refills | Status: AC

## 2019-07-04 MED ORDER — METHYLPREDNISOLONE ACETATE 80 MG/ML IJ SUSP
120.0000 mg | Freq: Once | INTRAMUSCULAR | Status: AC
  Administered 2019-07-04: 120 mg via INTRAMUSCULAR

## 2019-07-04 NOTE — Assessment & Plan Note (Signed)
Uncontrolled.Toradol and depo medrol administered IM in the office , to be followed by a short course of oral prednisone and NSAIDS.  

## 2019-07-04 NOTE — Progress Notes (Signed)
   [name]     MRN: [id number]      DOB: 09-Aug-1961   HPI [name] is here with a  5 week h/o low back pian , intensified  In the past week, difficulty walking, constant pain Localized area on low back paraspinal, radiating to groin and anterior thigh . No specific aggravating factor recalled. Denies any new incontinence or sensory or motor loss ROS Denies recent fever or chills. Denies sinus pressure, nasal congestion, ear pain or sore throat. Denies chest congestion, productive cough or wheezing. Denies chest pains, palpitations and leg swelling Denies abdominal pain, nausea, vomiting,diarrhea or constipation.   Denies dysuria, frequency, hesitancy or incontinence.  Denies depression uncontrolled  anxiety or insomnia. Denies skin break down or rash.   PE  BP 112/78   Pulse 92   Resp 12   Ht 5\' 4"  (1.626 m)   Wt 217 lb (98.4 kg)   SpO2 94%   BMI 37.25 kg/m   Patient alert and oriented and in no cardiopulmonary distress.Pt in pain  HEENT: No facial asymmetry, EOMI,   oropharynx pink and moist.  Neck supple no JVD, no mass.  Chest: Clear to auscultation bilaterally.  CVS: S1, S2 no murmurs, no S3.Regular rate.  ABD: Soft non tender.   Ext: No edema  MS: decreased  ROM spine,adequate in shoulders hips and knees.  Skin: Intact, no ulcerations or rash noted.  Psych: Good eye contact, normal affect. Memory intact not anxious or depressed appearing.  CNS: CN 2-12 intact, power,  normal throughout.no focal deficits noted.   Assessment & Plan  Low back pain radiating to right leg Uncontrolled.Toradol and depo medrol administered IM in the office , to be followed by a short course of oral prednisone and NSAIDS.   Hip pain, right Reports new and worsening right hip pain in past 1 month, clinical impression is this is radiating from spine, X ray today  Hypothyroidism Controlled, no change in medication   Presence of IVC filter Chronic disabling  back pain attributed to current misplaced position , awaiting clearance to go to [facility] fr removal, covid 19 the deterrant  Morbid obesity ([place]) Obesity linked with back pain and anxiety and breast cancer  Patient re-educated about  the importance of commitment to a  minimum of 150 minutes of exercise per week as able.  The importance of healthy food choices with portion control discussed, as well as eating regularly and within a 12 hour window most days. The need to choose "clean , green" food 50 to 75% of the time is discussed, as well as to make water the primary drink and set a goal of 64 ounces water daily.    Weight /BMI 07/04/2019 06/06/2019 05/08/2019  WEIGHT 217 lb 199 lb 199 lb 8 oz  HEIGHT 5\' 4"  5\' 4"  5\' 4"   BMI 37.25 kg/m2 34.16 kg/m2 34.24 kg/m2  Some encounter information is confidential and restricted. Go to Review Flowsheets activity to see all data.

## 2019-07-04 NOTE — Patient Instructions (Signed)
F/U in October as before, call if you need me sooner  Toradol 60 mg and depo medrol 120 mg IM today for nerve pain and prednisone and ibuprofen have been prescribed and are at your pharmacy  Please get X ray of hip today  Thanks for choosing [facility], we consider it a privelige to serve you.

## 2019-07-08 ENCOUNTER — Encounter: Payer: Self-pay | Admitting: Family Medicine

## 2019-07-08 DIAGNOSIS — M25551 Pain in right hip: Secondary | ICD-10-CM | POA: Insufficient documentation

## 2019-07-08 HISTORY — DX: Pain in right hip: M25.551

## 2019-07-08 NOTE — Assessment & Plan Note (Signed)
Chronic disabling back pain attributed to current misplaced position , awaiting clearance to go to [facility] fr removal, covid 19 the deterrant

## 2019-07-08 NOTE — Assessment & Plan Note (Signed)
Reports new and worsening right hip pain in past 1 month, clinical impression is this is radiating from spine, X ray today

## 2019-07-08 NOTE — Assessment & Plan Note (Signed)
Controlled, no change in medication  

## 2019-07-08 NOTE — Assessment & Plan Note (Addendum)
Obesity linked with back pain and anxiety and breast cancer  Patient re-educated about  the importance of commitment to a  minimum of 150 minutes of exercise per week as able.  The importance of healthy food choices with portion control discussed, as well as eating regularly and within a 12 hour window most days. The need to choose "clean , green" food 50 to 75% of the time is discussed, as well as to make water the primary drink and set a goal of 64 ounces water daily.    Weight /BMI 07/04/2019 06/06/2019 05/08/2019  WEIGHT 217 lb 199 lb 199 lb 8 oz  HEIGHT 5\' 4"  5\' 4"  5\' 4"   BMI 37.25 kg/m2 34.16 kg/m2 34.24 kg/m2  Some encounter information is confidential and restricted. Go to Review Flowsheets activity to see all data.

## 2019-07-30 ENCOUNTER — Other Ambulatory Visit: Payer: Self-pay | Admitting: Family Medicine

## 2019-08-02 DIAGNOSIS — Z853 Personal history of malignant neoplasm of breast: Secondary | ICD-10-CM | POA: Diagnosis not present

## 2019-08-02 DIAGNOSIS — Z9012 Acquired absence of left breast and nipple: Secondary | ICD-10-CM | POA: Diagnosis not present

## 2019-08-02 DIAGNOSIS — Z9882 Breast implant status: Secondary | ICD-10-CM | POA: Diagnosis not present

## 2019-08-08 ENCOUNTER — Telehealth: Payer: Self-pay | Admitting: *Deleted

## 2019-08-08 ENCOUNTER — Telehealth: Payer: Self-pay | Admitting: Family Medicine

## 2019-08-08 ENCOUNTER — Ambulatory Visit (INDEPENDENT_AMBULATORY_CARE_PROVIDER_SITE_OTHER): Payer: Medicare Other | Admitting: Family Medicine

## 2019-08-08 ENCOUNTER — Other Ambulatory Visit: Payer: Self-pay

## 2019-08-08 ENCOUNTER — Encounter: Payer: Self-pay | Admitting: Family Medicine

## 2019-08-08 VITALS — BP 112/78 | Ht 64.0 in | Wt 214.0 lb

## 2019-08-08 DIAGNOSIS — E785 Hyperlipidemia, unspecified: Secondary | ICD-10-CM

## 2019-08-08 DIAGNOSIS — Z79891 Long term (current) use of opiate analgesic: Secondary | ICD-10-CM | POA: Diagnosis not present

## 2019-08-08 DIAGNOSIS — G43701 Chronic migraine without aura, not intractable, with status migrainosus: Secondary | ICD-10-CM | POA: Diagnosis not present

## 2019-08-08 DIAGNOSIS — D509 Iron deficiency anemia, unspecified: Secondary | ICD-10-CM | POA: Diagnosis not present

## 2019-08-08 DIAGNOSIS — E038 Other specified hypothyroidism: Secondary | ICD-10-CM | POA: Diagnosis not present

## 2019-08-08 DIAGNOSIS — E8881 Metabolic syndrome: Secondary | ICD-10-CM | POA: Diagnosis not present

## 2019-08-08 DIAGNOSIS — R252 Cramp and spasm: Secondary | ICD-10-CM

## 2019-08-08 DIAGNOSIS — M545 Low back pain: Secondary | ICD-10-CM | POA: Diagnosis not present

## 2019-08-08 DIAGNOSIS — R790 Abnormal level of blood mineral: Secondary | ICD-10-CM

## 2019-08-08 DIAGNOSIS — K458 Other specified abdominal hernia without obstruction or gangrene: Secondary | ICD-10-CM

## 2019-08-08 DIAGNOSIS — G43101 Migraine with aura, not intractable, with status migrainosus: Secondary | ICD-10-CM | POA: Diagnosis not present

## 2019-08-08 NOTE — Telephone Encounter (Signed)
Pt called said Dr. Moshe Cipro ws going to send her for labs however she did not want to go next door and she needs those faxed to [facility] She will go tomorrow to have them drawn

## 2019-08-08 NOTE — Telephone Encounter (Signed)
Labs faxed to [facility]

## 2019-08-08 NOTE — Progress Notes (Signed)
Virtual Visit via Telephone Note  I connected with Lariza Cothron on 08/08/19 at  9:40 AM EDT by telephone and verified that I am speaking with the correct person using two identifiers.  Location: Patient: home  Provider: office   I discussed the limitations, risks, security and privacy concerns of performing an evaluation and management service by telephone and the availability of in person appointments. I also discussed with the patient that there may be a patient responsible charge related to this service. The patient expressed understanding and agreed to proceed.   History of Present Illness:aftook for 1 month C/o adverse reaCTION TO CRESTOR EXTREME MUSCLE SPASMS CAUSING PAIN,took this for 3 months, but in past 1 month developed symptoms D/C FOR PAST 9 days, symptoms have improved since stopping, thinks 50 % better   Has had most recent consultation at [facility] in 2016 , re intestinal hernia from multiple previous intestinal surgeries , had surgery at [facility] which resulted in July 2012, during procedure for presumed ovarian cancer in 2012 at [place] Denies recent fever or chills. Denies sinus pressure, nasal congestion, ear pain or sore throat. Denies chest congestion, productive cough or wheezing. Denies chest pains, palpitations and leg swelling C/o chronic abdominal pain and distension.   Denies dysuria, frequency, hesitancy or incontinence. C/o chronic and uncontrolled back pain. Denies headaches, seizures, numbness, or tingling. Denies depression, anxiety or insomnia. Denies skin break down or rash.     Observations/Objective: BP 112/78   Ht 5\' 4"  (1.626 m)   Wt 214 lb (97.1 kg)   BMI 36.73 kg/m  Good communication with no confusion and intact memory. Alert and oriented x 3 No signs of respiratory distress during speech   Assessment and Plan:  Abdominal hernia Pt has large midline hernia since 2012 which she is recusing a referral for consideration  for repair.  Last eval  Was in 2016 at [facility]. Hernia developed following 2012 surgery at [facility], will refer per her request  Hypothyroidism Controlled, no change in medication   Morbid obesity ([place]) Obesity l;inked with hyperlipidemia and cardiovascular disease  Patient re-educated about  the importance of commitment to a  minimum of 150 minutes of exercise per week as able.  The importance of healthy food choices with portion control discussed, as well as eating regularly and within a 12 hour window most days. The need to choose "clean , green" food 50 to 75% of the time is discussed, as well as to make water the primary drink and set a goal of 64 ounces water daily.    Weight /BMI 08/08/2019 07/04/2019 06/06/2019  WEIGHT 214 lb 217 lb 199 lb  HEIGHT 5\' 4"  5\' 4"  5\' 4"   BMI 36.73 kg/m2 37.25 kg/m2 34.16 kg/m2  Some encounter information is confidential and restricted. Go to Review Flowsheets activity to see all data.      Hyperlipidemia LDL goal <100 Hyperlipidemia:Low fat diet discussed and encouraged.   Lipid Panel  Lab Results  Component Value Date   CHOL 214 (H) 07/04/2018   HDL 48 07/04/2018   LDLCALC 123 (H) 07/04/2018   LDLDIRECT 138 (H) 09/15/2012   TRIG 214 (H) 07/04/2018   CHOLHDL 4.5 (H) 07/04/2018   Intolerant of crestor, needs to resume pravastatin, and follow low fat diet Updated lab needed at/ before next visit.     Follow Up Instructions:    I discussed the assessment and treatment plan with the patient. The patient was provided an opportunity to ask questions and all were  answered. The patient agreed with the plan and demonstrated an understanding of the instructions.   The patient was advised to call back or seek an in-person evaluation if the symptoms worsen or if the condition fails to improve as anticipated.  I provided 22 minutes of non-face-to-face time during this encounter.   [name], MD

## 2019-08-08 NOTE — Patient Instructions (Addendum)
F/U as before, call   Fasting lipid, cmp and eGFr, tSH., CK, Ferritin today at [facility]   No more crestor  Condolence re Dad's  passing   You will be referred to center in [place] per your request  It is important that you exercise regularly at least 30 minutes 5 times a week. If you develop chest pain, have severe difficulty breathing, or feel very tired, stop exercising immediately and seek medical attention  Think about what you will eat, plan ahead. Choose " clean, green, fresh or frozen" over canned, processed or packaged foods which are more sugary, salty and fatty. 70 to 75% of food eaten should be vegetables and fruit. Three meals at set times with snacks allowed between meals, but they must be fruit or vegetables. Aim to eat over a 12 hour period , example 7 am to 7 pm, and STOP after  your last meal of the day. Drink water,generally about 64 ounces per day, no other drink is as healthy. Fruit juice is best enjoyed in a healthy way, by EATING the fruit. Thanks for choosing [facility], we consider it a privelige to serve you.

## 2019-08-14 ENCOUNTER — Encounter: Payer: Self-pay | Admitting: Family Medicine

## 2019-08-14 DIAGNOSIS — K469 Unspecified abdominal hernia without obstruction or gangrene: Secondary | ICD-10-CM

## 2019-08-14 HISTORY — DX: Unspecified abdominal hernia without obstruction or gangrene: K46.9

## 2019-08-14 NOTE — Assessment & Plan Note (Signed)
Hyperlipidemia:Low fat diet discussed and encouraged.   Lipid Panel  Lab Results  Component Value Date   CHOL 214 (H) 07/04/2018   HDL 48 07/04/2018   LDLCALC 123 (H) 07/04/2018   LDLDIRECT 138 (H) 09/15/2012   TRIG 214 (H) 07/04/2018   CHOLHDL 4.5 (H) 07/04/2018   Intolerant of crestor, needs to resume pravastatin, and follow low fat diet Updated lab needed at/ before next visit.

## 2019-08-14 NOTE — Assessment & Plan Note (Signed)
Controlled, no change in medication  

## 2019-08-14 NOTE — Assessment & Plan Note (Signed)
Obesity l;inked with hyperlipidemia and cardiovascular disease  Patient re-educated about  the importance of commitment to a  minimum of 150 minutes of exercise per week as able.  The importance of healthy food choices with portion control discussed, as well as eating regularly and within a 12 hour window most days. The need to choose "clean , green" food 50 to 75% of the time is discussed, as well as to make water the primary drink and set a goal of 64 ounces water daily.    Weight /BMI 08/08/2019 07/04/2019 06/06/2019  WEIGHT 214 lb 217 lb 199 lb  HEIGHT 5\' 4"  5\' 4"  5\' 4"   BMI 36.73 kg/m2 37.25 kg/m2 34.16 kg/m2  Some encounter information is confidential and restricted. Go to Review Flowsheets activity to see all data.

## 2019-08-14 NOTE — Assessment & Plan Note (Signed)
Pt has large midline hernia since 2012 which she is recusing a referral for consideration  for repair. Last eval  Was in 2016 at [facility]. Hernia developed following 2012 surgery at [facility], will refer per her request

## 2019-08-15 DIAGNOSIS — E038 Other specified hypothyroidism: Secondary | ICD-10-CM | POA: Diagnosis not present

## 2019-08-15 DIAGNOSIS — E8881 Metabolic syndrome: Secondary | ICD-10-CM | POA: Diagnosis not present

## 2019-08-15 DIAGNOSIS — R790 Abnormal level of blood mineral: Secondary | ICD-10-CM | POA: Diagnosis not present

## 2019-08-15 DIAGNOSIS — R252 Cramp and spasm: Secondary | ICD-10-CM | POA: Diagnosis not present

## 2019-08-15 DIAGNOSIS — E785 Hyperlipidemia, unspecified: Secondary | ICD-10-CM | POA: Diagnosis not present

## 2019-08-17 ENCOUNTER — Telehealth: Payer: Self-pay | Admitting: Family Medicine

## 2019-08-17 ENCOUNTER — Encounter: Payer: Self-pay | Admitting: Family Medicine

## 2019-08-17 NOTE — Telephone Encounter (Signed)
Message is sent , pls review with her Let her know kidney function and liver enzymes are normal Triglycerides very high , 457 should be less than 150, totoal cholesterol 214, should be less than 200, good cholesterol hDL, low at 42 should be 50, need to increase exercise to iprove this. Risk of heart disease at high normal 5.0 Muscle enzyme elevated at 230, normal less than 140. Stopping the crestor has reduced muscle pain so enzyme should go down, may repeat this at next labs due early dec, pls order fasting lipid, cmp and eGFR, CPK and tSH pls verify that she will resume prvastatin which she stated she can tolerate

## 2019-08-18 ENCOUNTER — Other Ambulatory Visit: Payer: Self-pay | Admitting: Family Medicine

## 2019-08-18 MED ORDER — PRAVASTATIN SODIUM 40 MG PO TABS: 40.0000 mg | ORAL_TABLET | Freq: Every day | ORAL | 3 refills | Status: DC

## 2019-08-18 NOTE — Telephone Encounter (Signed)
Left generic message to call back to discuss recent lab results. ° °

## 2019-08-22 ENCOUNTER — Telehealth: Payer: Self-pay

## 2019-08-22 DIAGNOSIS — R252 Cramp and spasm: Secondary | ICD-10-CM

## 2019-08-22 DIAGNOSIS — E038 Other specified hypothyroidism: Secondary | ICD-10-CM

## 2019-08-22 DIAGNOSIS — E785 Hyperlipidemia, unspecified: Secondary | ICD-10-CM

## 2019-08-22 NOTE — Telephone Encounter (Signed)
Labs ordered to be drawn in December

## 2019-08-22 NOTE — Telephone Encounter (Signed)
Spoke with patient and gave lab results with verbal understanding. Ordered labs and mailed to patient.

## 2019-09-03 NOTE — Telephone Encounter (Signed)
Noted that lab is to be drawn at [facility]

## 2019-09-05 ENCOUNTER — Other Ambulatory Visit: Payer: Self-pay | Admitting: Family Medicine

## 2019-09-07 DIAGNOSIS — K56609 Unspecified intestinal obstruction, unspecified as to partial versus complete obstruction: Secondary | ICD-10-CM | POA: Diagnosis not present

## 2019-09-07 DIAGNOSIS — Z4682 Encounter for fitting and adjustment of non-vascular catheter: Secondary | ICD-10-CM | POA: Diagnosis not present

## 2019-09-07 DIAGNOSIS — K5641 Fecal impaction: Secondary | ICD-10-CM | POA: Diagnosis not present

## 2019-09-07 DIAGNOSIS — Z882 Allergy status to sulfonamides status: Secondary | ICD-10-CM | POA: Diagnosis not present

## 2019-09-07 DIAGNOSIS — Z79899 Other long term (current) drug therapy: Secondary | ICD-10-CM | POA: Diagnosis not present

## 2019-09-08 DIAGNOSIS — K56600 Partial intestinal obstruction, unspecified as to cause: Secondary | ICD-10-CM | POA: Diagnosis not present

## 2019-09-08 DIAGNOSIS — E039 Hypothyroidism, unspecified: Secondary | ICD-10-CM | POA: Diagnosis not present

## 2019-09-08 DIAGNOSIS — Z95828 Presence of other vascular implants and grafts: Secondary | ICD-10-CM | POA: Diagnosis not present

## 2019-09-08 DIAGNOSIS — Z8543 Personal history of malignant neoplasm of ovary: Secondary | ICD-10-CM | POA: Diagnosis not present

## 2019-09-08 DIAGNOSIS — K56609 Unspecified intestinal obstruction, unspecified as to partial versus complete obstruction: Secondary | ICD-10-CM | POA: Diagnosis not present

## 2019-09-08 DIAGNOSIS — Z9012 Acquired absence of left breast and nipple: Secondary | ICD-10-CM | POA: Diagnosis not present

## 2019-09-08 DIAGNOSIS — Z90710 Acquired absence of both cervix and uterus: Secondary | ICD-10-CM | POA: Diagnosis not present

## 2019-09-08 DIAGNOSIS — R1084 Generalized abdominal pain: Secondary | ICD-10-CM | POA: Diagnosis not present

## 2019-09-08 DIAGNOSIS — Z8719 Personal history of other diseases of the digestive system: Secondary | ICD-10-CM | POA: Diagnosis not present

## 2019-09-08 DIAGNOSIS — Z6836 Body mass index (BMI) 36.0-36.9, adult: Secondary | ICD-10-CM | POA: Diagnosis not present

## 2019-09-08 DIAGNOSIS — K439 Ventral hernia without obstruction or gangrene: Secondary | ICD-10-CM | POA: Diagnosis not present

## 2019-09-08 DIAGNOSIS — Z853 Personal history of malignant neoplasm of breast: Secondary | ICD-10-CM | POA: Diagnosis not present

## 2019-09-08 DIAGNOSIS — Z79899 Other long term (current) drug therapy: Secondary | ICD-10-CM | POA: Diagnosis not present

## 2019-09-08 DIAGNOSIS — Z90722 Acquired absence of ovaries, bilateral: Secondary | ICD-10-CM | POA: Diagnosis not present

## 2019-09-08 DIAGNOSIS — K432 Incisional hernia without obstruction or gangrene: Secondary | ICD-10-CM | POA: Diagnosis not present

## 2019-09-09 MED ORDER — ONDANSETRON HCL 4 MG/2ML IJ SOLN
4.00 | INTRAMUSCULAR | Status: DC
Start: ? — End: 2019-09-09

## 2019-09-09 MED ORDER — PANTOPRAZOLE SODIUM 40 MG IV SOLR
40.00 | INTRAVENOUS | Status: DC
Start: 2019-09-10 — End: 2019-09-09

## 2019-09-09 MED ORDER — LIDOCAINE HCL (PF) 1 % IJ SOLN
0.50 | INTRAMUSCULAR | Status: DC
Start: ? — End: 2019-09-09

## 2019-09-09 MED ORDER — ESTROGENS, CONJUGATED 0.625 MG/GM VA CREA
0.50 | TOPICAL_CREAM | VAGINAL | Status: DC
Start: 2019-09-09 — End: 2019-09-09

## 2019-09-09 MED ORDER — GENERIC EXTERNAL MEDICATION
6.25 | Status: DC
Start: ? — End: 2019-09-09

## 2019-09-09 MED ORDER — CYCLOBENZAPRINE HCL 10 MG PO TABS
10.00 | ORAL_TABLET | ORAL | Status: DC
Start: ? — End: 2019-09-09

## 2019-09-09 MED ORDER — HYDROMORPHONE HCL 4 MG PO TABS
4.00 | ORAL_TABLET | ORAL | Status: DC
Start: ? — End: 2019-09-09

## 2019-09-18 ENCOUNTER — Telehealth: Payer: Self-pay

## 2019-09-18 NOTE — Telephone Encounter (Signed)
Transition Care Management Follow-up Telephone Call   Date discharged?      09/10/2019          How have you been since you were released from the hospital? I just feel really weak. I've had a lot of pain off and on.   Do you understand why you were in the hospital? Bowel blockage on the left side   Do you understand the discharge instructions? yes   Where were you discharged to? home   Items Reviewed:  Medications reviewed: yes  Allergies reviewed: yes  Dietary changes reviewed: lighter and smaller meals  Referrals reviewed: no new referrals   Functional Questionnaire:   Activities of Daily Living (ADLs):  just taking it easy and resting    Any transportation issues/concerns?: no   Any patient concerns? no    Confirmed importance and date/time of follow-up visits scheduled Thursday 09/21/19     Confirmed with patient if condition begins to worsen call PCP or go to the ER.  Patient was given the office number and encouraged to call back with question or concerns. Yes with verbal understandig

## 2019-09-21 ENCOUNTER — Ambulatory Visit: Payer: Medicare Other | Admitting: Family Medicine

## 2019-10-10 ENCOUNTER — Encounter: Payer: Self-pay | Admitting: Family Medicine

## 2019-10-12 DIAGNOSIS — M545 Low back pain: Secondary | ICD-10-CM | POA: Diagnosis not present

## 2019-10-12 DIAGNOSIS — G43701 Chronic migraine without aura, not intractable, with status migrainosus: Secondary | ICD-10-CM | POA: Diagnosis not present

## 2019-10-12 DIAGNOSIS — G43101 Migraine with aura, not intractable, with status migrainosus: Secondary | ICD-10-CM | POA: Diagnosis not present

## 2019-10-12 DIAGNOSIS — Z79891 Long term (current) use of opiate analgesic: Secondary | ICD-10-CM | POA: Diagnosis not present

## 2019-10-17 ENCOUNTER — Encounter: Payer: Self-pay | Admitting: Family Medicine

## 2019-10-17 ENCOUNTER — Other Ambulatory Visit: Payer: Self-pay

## 2019-10-17 ENCOUNTER — Telehealth: Payer: Self-pay

## 2019-10-17 ENCOUNTER — Ambulatory Visit (INDEPENDENT_AMBULATORY_CARE_PROVIDER_SITE_OTHER): Payer: Medicare Other | Admitting: Family Medicine

## 2019-10-17 ENCOUNTER — Other Ambulatory Visit: Payer: Self-pay | Admitting: Family Medicine

## 2019-10-17 VITALS — BP 120/80 | HR 94 | Temp 99.2°F | Resp 15 | Ht 64.5 in | Wt 223.1 lb

## 2019-10-17 DIAGNOSIS — Z23 Encounter for immunization: Secondary | ICD-10-CM

## 2019-10-17 DIAGNOSIS — R790 Abnormal level of blood mineral: Secondary | ICD-10-CM

## 2019-10-17 DIAGNOSIS — R21 Rash and other nonspecific skin eruption: Secondary | ICD-10-CM

## 2019-10-17 DIAGNOSIS — R1031 Right lower quadrant pain: Secondary | ICD-10-CM

## 2019-10-17 DIAGNOSIS — R5383 Other fatigue: Secondary | ICD-10-CM

## 2019-10-17 DIAGNOSIS — E038 Other specified hypothyroidism: Secondary | ICD-10-CM | POA: Diagnosis not present

## 2019-10-17 MED ORDER — LEVOTHYROXINE SODIUM 125 MCG PO TABS: 125.0000 ug | ORAL_TABLET | Freq: Every day | ORAL | 1 refills | Status: DC

## 2019-10-17 MED ORDER — ALPRAZOLAM 1 MG PO TABS: 1.0000 mg | ORAL_TABLET | Freq: Two times a day (BID) | ORAL | 0 refills | Status: DC

## 2019-10-17 NOTE — Patient Instructions (Signed)
  I appreciate the opportunity to provide you with the care for your health and wellness. Today we discussed: Rash, groin pain  Follow up: 10/25/2019 as already scheduled  Labs ordered today.  Consider referral for Ortho he can let us know at your next appointment.  This would be for your groin pain.  Additionally please update [place] on the labs that your other provider drew.  Today I am checking to see if you have an infection count.  Since we have a rash of unknown origin and its not giving you much problem/try to treat this with an oral allergy medicine such as Zyrtec.  Please take this daily for at least the next 4 weeks.  Let us know if the rash does not go away spreads or develops blisters.  Flu vaccine given today.  Tylenol as needed for arm discomfort.  Please continue to practice social distancing to keep you, your family, and our community safe.  If you must go out, please wear a mask and practice good handwashing.  It was a pleasure to see you and I look forward to continuing to work together on your health and well-being. Please do not hesitate to call the office if you need care or have questions about your care.  Have a wonderful day and week. With Gratitude, Cherly Beach, DNP, AGNP-BC

## 2019-10-17 NOTE — Telephone Encounter (Signed)
sent 

## 2019-10-17 NOTE — Telephone Encounter (Signed)
Patient needs her Alprazolam refilled please.

## 2019-10-17 NOTE — Progress Notes (Signed)
Subjective:     Patient ID: [name], female   DOB: 15-Nov-1961, 58 y.o.   MRN: [id number]  [name] presents for Groin Pain (radiates down leg) and Rash  [name] is a 58 year old female patient who has an extensive history of chronic pain followed by [facility], depression, anxiety, chronic constipation, bowel resections, PE, MRSA, ovarian cyst, SVT, A. fib, hypothyroidism, hyperlipidemia among others.  Presents today for a rash that is ongoing.  New within the last 2 weeks.  Denies having any itchiness, burning, tingling.  Denies having any changes to her laundry or hygienic products.  Denies having any new foods introduced, or eating any shellfish or tropical fruits or anything of that nature.  Denies having any exposure to anyone who has been ill.  Denies having any excessive sun exposure.  Reports that she had 1 area on her left breast that had some blisters.  It never developed a rash which is the blister.  Might have been a boil.  She has been taking care that cleaning it.  Is noted to have some scabbing on it at this time.  But she continues take care of it is not giving her any trouble.  She did have labs drawn about 2 weeks before the rash onset.  She does not remember her doctors telling her that there was anything going on.  She reports she thinks some more autoimmune related in nature.  Advised for her to reach out to get these. Additionally she request to have her ferritin and thyroid checked as she is feeling extra fatigued and tired.  Just feeling rundown.  Sometimes these are impacted when she is feeling like this.  She reports she still has groin pain going down the right leg into the knee.  But then it stops there.  Possible need for Ortho referral.  Previous CT scan in June did not demonstrate much of this could be in a contributor.  In need of flu vaccine as well.  Today patient denies signs and symptoms of COVID 19 infection including fever,  chills, cough, shortness of breath, and headache.  Past Medical, Surgical, Social History, Allergies, and Medications have been Reviewed.   Past Medical History:  Diagnosis Date   Anxiety    Carpal tunnel syndrome    Chronic constipation    Chronic pain syndrome    followed by [facility]---  back   Depression    Family history of adverse reaction to anesthesia    MOTHER--- PONV   Fibromyalgia    GERD (gastroesophageal reflux disease)    History of MRSA infection    lip abscess   History of ovarian cyst 06/2011   s/p  BSO   History of pulmonary embolus (PE) 1997   post EP with ablation pulmonary veouns for SVT/ Atrial Fib.   History of supraventricular tachycardia    s/p  ablation 1996  and 1997  by dr Caryl Comes   History of TIA (transient ischemic attack) 1997   post op EP ablation PE   Hyperlipidemia    Hypothyroidism    followed by pcp   Incisional hernia    Interstitial cystitis    09-13-2018   per pt last flare-up  May 2019 (followed by pcp)   Iron deficiency anemia    09-13-2018  PER PT STABLE   Lipoma of back    upper   Migraines    Normal coronary arteries    a. by CT 12/2018.  Paget disease of breast, left (HCC)    PONV (postoperative nausea and vomiting)    SEVERE   PSVT (paroxysmal supraventricular tachycardia) ([place])    [place]   S/P insertion of IVC (inferior vena caval) filter 05/08/2005   [place] (non-retrievable)  /  dx 2019  a leg of filter is protruding thru the vena cava in to right L2 vertebral body (09-13-2018  per pt having surgery to remove filter in [place])   S/P radiofrequency ablation operation for arrhythmia 1996   1996  and 1997,   SVT and Atrial Fib   Syncope    Wears glasses    Past Surgical History:  Procedure Laterality Date   ABDOMINAL HYSTERECTOMY  1987   ANTERIOR CERVICAL DECOMP/DISCECTOMY FUSION  03-07-2002   dr elsner  @[facility]    C 4 -- 5   APPENDECTOMY  1980   BILATERAL  SALPINGOOPHORECTOMY  07/24/2011   via Explor. Lap. w/ intraoperative perf. bowel repair   BREAST ENHANCEMENT SURGERY Bilateral 1993   BREAST IMPLANT REMOVAL Bilateral    CARDIAC CATHETERIZATION  07-06-2003   dr Darnell Level brodie   normal coronaries and LVF   CARDIAC ELECTROPHYSIOLOGY STUDY AND ABLATION  1996  and 1997   CARDIOVASCULAR STRESS TEST  11-18-2015   dr Caryl Comes   normal nuclear study w/ no ischemia/  normal LV function and wall motion , ef 84%   CARPAL TUNNEL RELEASE Right ?   CESAREAN SECTION  1986   COLON SURGERY     CYSTO/  HYDRODISTENTION/  INSTILATION THERAPY  MULTIPLE   ENTEROCUTANEOUS FISTULA CLOSURE  multiple   last one 2015 with small bowel resection   EXPLORATORY LAPAROTOMY INCISIONAL VENTRAL HERNIA REPAIR / RESECTION SMALL BOWEL  11-09-2014   @[facility]    LIPOMA EXCISION Right 09/16/2018   Procedure: EXCISION LIPOMA UPPER BACK;  Surgeon: Clovis Riley, MD;  Location: [place];  Service: General;  Laterality: Right;   MASTECTOMY Left 2006   w/ reconstruction on left (paget's disease)  and right breast augmentation   TOTAL COLECTOMY  08-04-2002    @[facility]    AND CHOLECYSTECTOMY  (colonic inertia)   TRANSTHORACIC ECHOCARDIOGRAM  02/04/2016   ef 60-65%,  grade 2 diastolic dysfunction/  mild MR   VENA CAVA FILTER PLACEMENT  05/08/2005   @[facility]    [place] (non-retrievable)   WIDE EXCISION PERIRECTAL ABSCESSES  09-22-2005   @ [facility]   Social History   Socioeconomic History   Marital status: Married    Spouse name: Not on file   Number of children: Not on file   Years of education: Not on file   Highest education level: Not on file  Occupational History   Not on file  Social Needs   Financial resource strain: Not on file   Food insecurity    Worry: Not on file    Inability: Not on file   Transportation needs    Medical: Not on file    Non-medical: Not on file  Tobacco Use   Smoking status: Never Smoker   Smokeless tobacco:  Never Used  Substance and Sexual Activity   Alcohol use: No   Drug use: No   Sexual activity: Yes    Birth control/protection: Surgical  Lifestyle   Physical activity    Days per week: Not on file    Minutes per session: Not on file   Stress: Not on file  Relationships   Social connections    Talks on phone: Not on file  Gets together: Not on file    Attends religious service: Not on file    Active member of club or organization: Not on file    Attends meetings of clubs or organizations: Not on file    Relationship status: Not on file   Intimate partner violence    Fear of current or ex partner: Not on file    Emotionally abused: Not on file    Physically abused: Not on file    Forced sexual activity: Not on file  Other Topics Concern   Not on file  Social History Narrative   Not on file    Outpatient Encounter Medications as of 10/17/2019  Medication Sig   ALPRAZolam (XANAX) 1 MG tablet Take 1 tablet (1 mg total) by mouth 2 (two) times daily as needed for anxiety.   aspirin 81 MG chewable tablet Chew 81 mg by mouth daily.   butalbital-acetaminophen-caffeine (FIORICET) 50-325-40 MG tablet Take 1 tablet by mouth as directed.   conjugated estrogens (PREMARIN) vaginal cream Place 1 Applicatorful vaginally at bedtime.   cyanocobalamin (,VITAMIN B-12,) 1000 MCG/ML injection INJECT 1 ML INTO THE MUSCLE EVERY 30 DAYS.   cyclobenzaprine (FLEXERIL) 10 MG tablet TAKE 1 TABLET(10 MG) BY MOUTH TWICE DAILY AS NEEDED FOR MUSCLE SPASMS   cycloSPORINE (RESTASIS) 0.05 % ophthalmic emulsion Place 1 drop into both eyes 2 (two) times daily.   DULoxetine (CYMBALTA) 30 MG capsule Take 1 capsule (30 mg total) by mouth 2 (two) times daily.   fluticasone (FLONASE) 50 MCG/ACT nasal spray Place 2 sprays into both nostrils as needed for allergies or rhinitis.   Glycerin, Laxative, 80.7 % SUPP Place 1 suppository rectally at bedtime as needed (for constipation).    HYDROmorphone  (DILAUDID) 4 MG tablet One tablet twice daily   levothyroxine (SYNTHROID) 125 MCG tablet Take 1 tablet (125 mcg total) by mouth daily before breakfast.   magnesium oxide (MAG-OX) 400 (241.3 Mg) MG tablet Take 1 tablet (400 mg total) by mouth 2 (two) times daily.   metoprolol tartrate (LOPRESSOR) 25 MG tablet Take 1 tablet (25 mg total) by mouth 2 (two) times daily as needed (palpitations).   Multiple Vitamins-Minerals (ICAPS AREDS 2) CAPS Take by mouth at bedtime.   omeprazole (PRILOSEC) 40 MG capsule Take 1 capsule (40 mg total) by mouth 2 (two) times a day.   phentermine (ADIPEX-P) 37.5 MG tablet Take 18.75 mg by mouth daily before breakfast.   potassium chloride (K-DUR) 10 MEQ tablet Take 1 tablet (10 mEq total) by mouth daily.   pravastatin (PRAVACHOL) 40 MG tablet Take 1 tablet (40 mg total) by mouth daily.   promethazine (PHENERGAN) 25 MG tablet Take 1 tablet by mouth as needed.   senna-docusate (SENOKOT-S) 8.6-50 MG per tablet Take 2 tablets by mouth 3 (three) times daily. constipation    Vitamin D, Ergocalciferol, (DRISDOL) 50000 units CAPS capsule Take 50,000 Units by mouth every 7 (seven) days.    rosuvastatin (CRESTOR) 5 MG tablet Take 1 tablet (5 mg total) by mouth daily.   No facility-administered encounter medications on file as of 10/17/2019.    Allergies  Allergen Reactions   Nitrofurantoin Hives   Crestor [Rosuvastatin Calcium] Other (See Comments)    Generalized cramps   Bisacodyl Other (See Comments)    Makes patient feel like she is having cramps Makes patient feel like she is having cramps   Clarithromycin Hives and Other (See Comments)    Other reaction(s): Other (See Comments) Cluster migraines  Cluster migraines  Clarithromycin Other (See Comments)    Cluster migraines    Clindamycin Hcl Hives   Codeine Itching   Monosodium Glutamate Other (See Comments)    Cluster migraines  Cluster migraines   Scopolamine Hbr Other (See Comments)      Cluster migraines, impaired vision    Review of Systems  Constitutional: Negative for chills and fever.  HENT: Negative.   Eyes: Negative.   Respiratory: Negative.   Cardiovascular: Negative.   Gastrointestinal: Negative.   Endocrine: Negative.   Genitourinary: Negative.   Musculoskeletal: Positive for arthralgias.  Skin: Positive for rash.  Allergic/Immunologic: Negative.   Neurological: Negative.   Hematological: Negative.   Psychiatric/Behavioral: Negative.   All other systems reviewed and are negative.      Objective:     BP 120/80    Pulse 94    Temp 99.2 F (37.3 C) (Oral)    Resp 15    Ht 5' 4.5" (1.638 m)    Wt 223 lb 1.3 oz (101.2 kg)    SpO2 94%    BMI 37.70 kg/m   Physical Exam Vitals signs and nursing note reviewed.  Constitutional:      Appearance: Normal appearance. She is obese.  HENT:     Head: Normocephalic and atraumatic.     Right Ear: External ear normal.     Left Ear: External ear normal.     Nose: Nose normal.     Mouth/Throat:     Pharynx: Oropharynx is clear.  Eyes:     General:        Right eye: No discharge.        Left eye: No discharge.     Conjunctiva/sclera: Conjunctivae normal.  Neck:     Musculoskeletal: Normal range of motion and neck supple.  Cardiovascular:     Rate and Rhythm: Normal rate and regular rhythm.     Pulses: Normal pulses.     Heart sounds: Normal heart sounds.  Pulmonary:     Effort: Pulmonary effort is normal.     Breath sounds: Normal breath sounds.  Chest:    Musculoskeletal:     Right hip: She exhibits tenderness.  Skin:    General: Skin is warm and dry.     Findings: Rash present. Rash is not urticarial.     Comments: Multiple areas of a non blanching to blanching rash noted predominantly on right shin, right anterior forearm and wrist, right side and left side of flank area, left forearm.Mostly macular in nature, not collected together. No blisters or raised areas.  Neurological:     General: No  focal deficit present.     Mental Status: She is alert and oriented to person, place, and time.  Psychiatric:        Mood and Affect: Mood normal.        Behavior: Behavior normal.        Thought Content: Thought content normal.        Judgment: Judgment normal.        Assessment and Plan        1. Rash in adult Rash in adult.  It is not noted to be itchy.  It is blanching to nonblanching little small splotches predominantly on the right shin, right anterior forearm, right flank side.  There is noted on the left forearm and left flank site.  Some mild right at the hip line across the back as well.  No blisters or raised areas seen.  More macular in  nature.  She denies having any changes in medications, hygienic items, sitting outside for extended period of time, new foods or anything that would have introduced a possible allergic reaction.  Is not currently taking anything for allergies at this time.  Strongly suggest to start taking Zyrtec daily for the next 4 weeks.  To see if that helps with what ever allergy she has been exposed to.  Secondary to not really having any itchiness to it or blistering to it do not feel that she needs any other treatment at this time.  Does not appear to be a viral rash at this time she does report that she has little bit of fatigue.  Had her labs drawn 2 weeks ago was not noted to have anything that she remembers being standout.  Reports that she thinks she might of had an autoimmune panel drawn this would be of importance given her history as well.  Advised for her to check out what her labs were and to contact us back.  Will be getting a CBC to rule out any infectious processes at this time. Additionally can look at referral if not better within a week or 2.   2. Fatigue, unspecified type Reports fatigue and just overall not feeling that great.  She reports she was not told that she had any abnormalities on her previous labs 2 weeks ago.  Advised her to  follow-up on those.  We will get a TSH and a CBC to rule out any infection.  - CBC with Differential/Platelet - TSH  3. Groin pain, right Continues to have right groin pain, back pain.  Advised that we could do an Ortho referral if this continues. Has a follow-up appointment in a week.  Can do a referral at that time.  4. Other specified hypothyroidism Secondary to fatigue and needing update TSH will be getting this this week.  - TSH  5. Low ferritin level Reports that she usually gets her ferritin levels checked.  She is wondering if that was causing her to feel better.  We will be checking this as well. - Ferritin  6. Need for immunization against influenza Patient was educated on the recommendation for flu vaccine. After obtaining informed consent, the vaccine was administered no adverse effects noted at time of administration. Patient provided with education on arm soreness and use of tylenol or ibuprofen (if safe) for this. Encourage to use the arm vaccine was given in to help reduce the soreness. Patient educated on the signs of a reaction to the vaccine and advised to contact the office should these occur.    - Flu Vaccine QUAD 36+ mos IM   Follow up: 10/25/2019  Perlie Mayo, DNP, AGNP-BC [address], [address] [place], [place] [address] Office Hours: Mon-Thurs 8 am-5 pm; Fri 8 am-12 pm Office Phone:  [phone]  Office Fax: [phone]

## 2019-10-25 ENCOUNTER — Encounter: Payer: Medicare Other | Admitting: Family Medicine

## 2019-11-06 ENCOUNTER — Telehealth: Payer: Self-pay | Admitting: *Deleted

## 2019-11-06 NOTE — Telephone Encounter (Signed)
[facility] called requesting notes faxed over last two ov notes release number [id number]

## 2019-11-08 DIAGNOSIS — E538 Deficiency of other specified B group vitamins: Secondary | ICD-10-CM | POA: Diagnosis not present

## 2019-11-08 DIAGNOSIS — R5383 Other fatigue: Secondary | ICD-10-CM | POA: Diagnosis not present

## 2019-11-08 DIAGNOSIS — E559 Vitamin D deficiency, unspecified: Secondary | ICD-10-CM | POA: Diagnosis not present

## 2019-11-08 DIAGNOSIS — K9089 Other intestinal malabsorption: Secondary | ICD-10-CM | POA: Diagnosis not present

## 2019-11-08 DIAGNOSIS — C50012 Malignant neoplasm of nipple and areola, left female breast: Secondary | ICD-10-CM | POA: Diagnosis not present

## 2019-12-01 ENCOUNTER — Other Ambulatory Visit: Payer: Self-pay | Admitting: Family Medicine

## 2019-12-05 ENCOUNTER — Other Ambulatory Visit: Payer: Self-pay | Admitting: Family Medicine

## 2019-12-07 DIAGNOSIS — M545 Low back pain: Secondary | ICD-10-CM | POA: Diagnosis not present

## 2019-12-07 DIAGNOSIS — G43701 Chronic migraine without aura, not intractable, with status migrainosus: Secondary | ICD-10-CM | POA: Diagnosis not present

## 2019-12-07 DIAGNOSIS — Z79891 Long term (current) use of opiate analgesic: Secondary | ICD-10-CM | POA: Diagnosis not present

## 2019-12-07 DIAGNOSIS — G43101 Migraine with aura, not intractable, with status migrainosus: Secondary | ICD-10-CM | POA: Diagnosis not present

## 2019-12-11 ENCOUNTER — Other Ambulatory Visit: Payer: Self-pay | Admitting: Physician Assistant

## 2019-12-11 ENCOUNTER — Other Ambulatory Visit: Payer: Self-pay | Admitting: Family Medicine

## 2019-12-12 DIAGNOSIS — D485 Neoplasm of uncertain behavior of skin: Secondary | ICD-10-CM | POA: Diagnosis not present

## 2019-12-12 DIAGNOSIS — L810 Postinflammatory hyperpigmentation: Secondary | ICD-10-CM | POA: Diagnosis not present

## 2019-12-12 DIAGNOSIS — L819 Disorder of pigmentation, unspecified: Secondary | ICD-10-CM | POA: Diagnosis not present

## 2019-12-14 ENCOUNTER — Telehealth: Payer: Self-pay

## 2019-12-14 ENCOUNTER — Other Ambulatory Visit: Payer: Self-pay | Admitting: Family Medicine

## 2019-12-14 DIAGNOSIS — R790 Abnormal level of blood mineral: Secondary | ICD-10-CM

## 2019-12-14 DIAGNOSIS — E039 Hypothyroidism, unspecified: Secondary | ICD-10-CM

## 2019-12-14 DIAGNOSIS — E559 Vitamin D deficiency, unspecified: Secondary | ICD-10-CM

## 2019-12-14 DIAGNOSIS — R7301 Impaired fasting glucose: Secondary | ICD-10-CM

## 2019-12-14 DIAGNOSIS — D539 Nutritional anemia, unspecified: Secondary | ICD-10-CM

## 2019-12-14 DIAGNOSIS — E8881 Metabolic syndrome: Secondary | ICD-10-CM

## 2019-12-14 DIAGNOSIS — E785 Hyperlipidemia, unspecified: Secondary | ICD-10-CM

## 2019-12-14 MED ORDER — ALPRAZOLAM 1 MG PO TABS: 1.0000 mg | ORAL_TABLET | Freq: Two times a day (BID) | ORAL | 0 refills | Status: DC

## 2019-12-14 NOTE — Telephone Encounter (Signed)
-----  Message from Fayrene Helper, MD sent at 12/14/2019  7:24 AM EST ----- Regarding: pls schedule Jan phone visit with m for f/u, med icaion management and lab review, needs in 3 weeks [name] pls order for her to have 1 week before, CBC, iron,  ferritin , B12, TSH, fasting lipid, cmp and eGFR, and Vit d nand hBA1C  I am refilling 30 days only of her medication today, needs oV before future e refills!  Thank you both!!

## 2020-01-01 ENCOUNTER — Telehealth: Payer: Self-pay | Admitting: *Deleted

## 2020-01-01 NOTE — Telephone Encounter (Signed)
Left vm advising patient that labs have been faxed to [facility]

## 2020-01-01 NOTE — Telephone Encounter (Signed)
Pt needs her lab orders sent to [facility]. They keep saying they have not received it and she was trying to go get this done before her appt. She would like a call when they have been faxed to [facility].

## 2020-01-03 ENCOUNTER — Encounter: Payer: Self-pay | Admitting: Family Medicine

## 2020-01-03 ENCOUNTER — Ambulatory Visit (INDEPENDENT_AMBULATORY_CARE_PROVIDER_SITE_OTHER): Payer: Medicare Other | Admitting: Family Medicine

## 2020-01-03 ENCOUNTER — Other Ambulatory Visit: Payer: Self-pay

## 2020-01-03 VITALS — BP 120/80 | HR 94 | Resp 15 | Ht 64.5 in | Wt 223.0 lb

## 2020-01-03 DIAGNOSIS — Z95828 Presence of other vascular implants and grafts: Secondary | ICD-10-CM

## 2020-01-03 DIAGNOSIS — D509 Iron deficiency anemia, unspecified: Secondary | ICD-10-CM

## 2020-01-03 DIAGNOSIS — E038 Other specified hypothyroidism: Secondary | ICD-10-CM | POA: Diagnosis not present

## 2020-01-03 DIAGNOSIS — M62838 Other muscle spasm: Secondary | ICD-10-CM

## 2020-01-03 DIAGNOSIS — E559 Vitamin D deficiency, unspecified: Secondary | ICD-10-CM

## 2020-01-03 DIAGNOSIS — K219 Gastro-esophageal reflux disease without esophagitis: Secondary | ICD-10-CM

## 2020-01-03 DIAGNOSIS — G4489 Other headache syndrome: Secondary | ICD-10-CM

## 2020-01-03 DIAGNOSIS — F411 Generalized anxiety disorder: Secondary | ICD-10-CM

## 2020-01-03 HISTORY — DX: Other muscle spasm: M62.838

## 2020-01-03 MED ORDER — CYCLOBENZAPRINE HCL 10 MG PO TABS: 10.0000 mg | ORAL_TABLET | Freq: Three times a day (TID) | ORAL | 4 refills | Status: DC

## 2020-01-03 MED ORDER — EZETIMIBE 10 MG PO TABS: 10.0000 mg | ORAL_TABLET | Freq: Every day | ORAL | 2 refills | Status: DC

## 2020-01-03 NOTE — Patient Instructions (Addendum)
F/U phone visit with MD in 4 months, call if you need me sooner  You are referred to Dr Merlene Laughter re muscle spasms  Increased dose of flexeril to 3 times daily   Labs will be added on as discussed, magnesium, ferritin and creatinine kinase and vit D  Additional medication forr triglyceride is zetia one daily  Think about what you will eat, plan ahead. Choose " clean, green, fresh or frozen" over canned, processed or packaged foods which are more sugary, salty and fatty. 70 to 75% of food eaten should be vegetables and fruit. Three meals at set times with snacks allowed between meals, but they must be fruit or vegetables. Aim to eat over a 12 hour period , example 7 am to 7 pm, and STOP after  your last meal of the day. Drink water,generally about 64 ounces per day, no other drink is as healthy. Fruit juice is best enjoyed in a healthy way, by EATING the fruit.   Thanks for choosing [facility], we consider it a privelige to serve you.  Best for 2021!

## 2020-01-03 NOTE — Progress Notes (Signed)
Virtual Visit via Telephone Note  I connected with [name] on 01/03/20 at  2:40 PM EST by telephone and verified that I am speaking with the correct person using two identifiers.  Location: Patient: home Provider: office   I discussed the limitations, risks, security and privacy concerns of performing an evaluation and management service by telephone and the availability of in person appointments. I also discussed with the patient that there may be a patient responsible charge related to this service. The patient expressed understanding and agreed to proceed.   History of Present Illness: F/U chronic problems  States she has rash over her entire body x 4 month right upper and lower ext , left lower ext, and also back, states her Oncologist told her this was leaking capillaries, was referred  to dermatology, dr Tarri Glenn, was told nothing can be done about this, has upcoming appt with reconstructive plastic surgeon at [facility] for implant removal, appt is 01/10/2020 Feels as though she needs another pet scan hurts all over to her bones, has cousin dx with metastatic cancer C/o generalized spasm and  Pain in limbs Denies recent fever or chills. Denies sinus pressure, nasal congestion, ear pain or sore throat. Denies chest congestion, productive cough or wheezing. Denies chest pains, palpitations and leg swelling Denies abdominal pain, nausea, vomiting,diarrhea or constipation.   Denies dysuria, frequency, hesitancy or incontinence. Denies joint pain, swelling and limitation in mobility. Denies uncontrolled  headaches, and , numbness,and  tingling. Denies depression,or uncontrolled  anxiety or insomnia.      Observations/Objective: BP 120/80   Pulse 94   Resp 15   Ht 5' 4.5" (1.638 m)   Wt 223 lb (101.2 kg)   BMI 37.69 kg/m  Good communication with no confusion and intact memory. Alert and oriented x 3 No signs of respiratory distress during speech    Assessment and  Plan: Muscle spasm Generalized disabling muscle spasms, refer to Neurology, also increase in flexeril dose  Hypothyroidism Controlled, no change in medication   Presence of IVC filter Still awaiting surgery for removal  Headache syndrome Controlled, no change in medication   Morbid obesity ([place]) Obesity linked with arthritis and hyprtension  Patient re-educated about  the importance of commitment to a  minimum of 150 minutes of exercise per week as able.  The importance of healthy food choices with portion control discussed, as well as eating regularly and within a 12 hour window most days. The need to choose "clean , green" food 50 to 75% of the time is discussed, as well as to make water the primary drink and set a goal of 64 ounces water daily.    Weight /BMI 01/03/2020 10/17/2019 08/08/2019  WEIGHT 223 lb 223 lb 1.3 oz 214 lb  HEIGHT 5' 4.5" 5' 4.5" 5\' 4"   BMI 37.69 kg/m2 37.7 kg/m2 36.73 kg/m2  Some encounter information is confidential and restricted. Go to Review Flowsheets activity to see all data.      Anxiety state Controlled on current medication, continue same  GERD (gastroesophageal reflux disease) Controlled, no change in medication     Follow Up Instructions:    I discussed the assessment and treatment plan with the patient. The patient was provided an opportunity to ask questions and all were answered. The patient agreed with the plan and demonstrated an understanding of the instructions.   The patient was advised to call back or seek an in-person evaluation if the symptoms worsen or if the condition fails to improve  as anticipated.  I provided 18 minutes of non-face-to-face time during this encounter.   [name], MD

## 2020-01-03 NOTE — Assessment & Plan Note (Signed)
Generalized disabling muscle spasms, refer to Neurology, also increase in flexeril dose

## 2020-01-07 ENCOUNTER — Encounter: Payer: Self-pay | Admitting: Family Medicine

## 2020-01-07 MED ORDER — ALPRAZOLAM 1 MG PO TABS: 1.0000 mg | ORAL_TABLET | Freq: Two times a day (BID) | ORAL | 5 refills | Status: DC | PRN

## 2020-01-07 NOTE — Assessment & Plan Note (Signed)
Controlled, no change in medication  

## 2020-01-07 NOTE — Assessment & Plan Note (Signed)
Still awaiting surgery for removal

## 2020-01-07 NOTE — Assessment & Plan Note (Signed)
Obesity linked with arthritis and hyprtension  Patient re-educated about  the importance of commitment to a  minimum of 150 minutes of exercise per week as able.  The importance of healthy food choices with portion control discussed, as well as eating regularly and within a 12 hour window most days. The need to choose "clean , green" food 50 to 75% of the time is discussed, as well as to make water the primary drink and set a goal of 64 ounces water daily.    Weight /BMI 01/03/2020 10/17/2019 08/08/2019  WEIGHT 223 lb 223 lb 1.3 oz 214 lb  HEIGHT 5' 4.5" 5' 4.5" 5\' 4"   BMI 37.69 kg/m2 37.7 kg/m2 36.73 kg/m2  Some encounter information is confidential and restricted. Go to Review Flowsheets activity to see all data.

## 2020-01-07 NOTE — Assessment & Plan Note (Signed)
Controlled on current medication, continue same 

## 2020-01-30 NOTE — Progress Notes (Deleted)
Patient Care Team: Fayrene Helper, MD as PCP - General Deboraha Sprang, MD as PCP - Cardiology (Cardiology) Selinda Eon (Inactive) as Referring Physician Ray, Debroah Loop, MD (Anesthesiology) Hennie Duos, MD as Consulting Physician (Rheumatology) Ray, Debroah Loop, MD as Referring Physician (Anesthesiology) Lovenia Shuck, MD as Referring Physician (Cardiothoracic Surgery)   HPI  [name] is a 59 y.o. female  Hx of PEs 1990s, IVC filter, awaiting removal ( was going to be done in [place]), Breast CA-pagets, migraines   DATE TEST EF   11/16 MYOVIEW  84 % No Ischemia   2/17 Echo   60-65 %   1/20 Echo      Hx of tachycardia ablation @ [facility], Flutter (I think)  Seen by DD, 1/20 and Event Recorder personnally reviewed  >> flutters mostly assoc PACs, some asymptomatic nonsustained atrial tach,  Chest pressure assoc with sinus rhythm  Records and Results Reviewed***  Past Medical History:  Diagnosis Date  . Abdominal hernia 08/14/2019  . Abdominal pain 06/06/2019  . Acute bronchitis 05/01/2013  . Acute cystitis 06/09/2010   Qualifier: Diagnosis of  By: Cori Razor LPN, Brandi    . Acute renal insufficiency 08/28/2011  . Anxiety   . Anxiety state 03/16/2008   Qualifier: Diagnosis of  By: Dierdre Harness    . Back pain with radiation 07/17/2014  . Bilateral hand pain 07/27/2016  . Carpal tunnel syndrome   . Chronic constipation   . Chronic pain syndrome    followed by [facility]---  back  . Depression   . Dermatitis 12/15/2011  . Educated about COVID-19 virus infection 05/14/2019  . Fall at home 01/26/2016  . Family history of adverse reaction to anesthesia    MOTHER--- PONV  . Fatigue 09/17/2012  . Fibromyalgia   . FIBROMYALGIA 03/16/2008   Qualifier: Diagnosis of  By: Dierdre Harness    . GERD (gastroesophageal reflux disease)   . Headache disorder 01/16/2013  . Headache syndrome 01/26/2016  . Hip pain, right 07/08/2019  . History of MRSA infection    lip  abscess  . History of ovarian cyst 06/2011   s/p  BSO  . History of pulmonary embolus (PE) 1997   post EP with ablation pulmonary veouns for SVT/ Atrial Fib.  . History of supraventricular tachycardia    s/p  ablation 1996  and 1997  by dr Caryl Comes  . History of TIA (transient ischemic attack) 1997   post op EP ablation PE  . Hyperlipidemia   . Hyperlipidemia LDL goal <100 03/16/2008   Qualifier: Diagnosis of  By: Dierdre Harness    . Hypothyroidism    followed by pcp  . Incisional hernia   . Insomnia 12/15/2011  . Intermittent palpitations 08/22/2017  . Interstitial cystitis    09-13-2018   per pt last flare-up  May 2019 (followed by pcp)  . INTERSTITIAL CYSTITIS 02/17/2011   Qualifier: Diagnosis of  By: Moshe Cipro MD, Joycelyn Schmid    . Iron deficiency anemia    09-13-2018  PER PT STABLE  . Irregular heart rate 07/25/2013  . Lipoma of back    upper  . Low back pain radiating to right leg 07/04/2019  . Low ferritin level 06/14/2014  . MDD (major depressive disorder), single episode, in full remission ([place]) 10/27/2017  . Medically noncompliant 02/28/2015   Multiple missed appointments, both follow-up appointments and lab appointments.   . Metabolic syndrome X [id number]   Qualifier: Diagnosis of  By: Moshe Cipro  MD, Joycelyn Schmid  hBA1c is 5.8 in 02/2013   . Migraines   . Morbid obesity ([place]) 03/27/2013  . Muscle spasm 01/03/2020  . Nausea alone 07/17/2014  . NECK PAIN, CHRONIC 03/16/2008   Qualifier: Diagnosis of  By: Dierdre Harness    . Normal coronary arteries    a. by CT 12/2018.  . Obesity 03/16/2008   Qualifier: Diagnosis of  By: Dierdre Harness    . Oral ulceration 08/23/2014   Presented at 06/14/2014 visit   . Other malaise and fatigue 03/16/2008   Centricity Description: FATIGUE, CHRONIC Qualifier: Diagnosis of  By: Dierdre Harness   Centricity Description: FATIGUE Qualifier: Diagnosis of  By: Christy Sartorius, [place]    . OVARIAN CYST 12/26/2009   Qualifier: Diagnosis of  By: Moshe Cipro MD, Joycelyn Schmid    . Paget  disease of breast, left ([place])   . Paget's disease of breast, left ([place]) 03/16/2008   Qualifier: Diagnosis of  By: Dierdre Harness  Left diagnosed in 2006 F/h breast cancer x 15 family members  . PONV (postoperative nausea and vomiting)    SEVERE  . Postsurgical menopause 11/13/2011  . Presence of IVC filter 06/06/2019  . PSVT (paroxysmal supraventricular tachycardia) ([place])    [name]  . Recurrent oral herpes simplex infection 10/13/2017  . ROM (right otitis media) 01/23/2013  . S/P insertion of IVC (inferior vena caval) filter 05/08/2005   greenfield (non-retrievable)  /  dx 2019  a leg of filter is protruding thru the vena cava in to right L2 vertebral body (09-13-2018  per pt having surgery to remove filter in [place])  . S/P radiofrequency ablation operation for arrhythmia 1996   1996  and 1997,   SVT and Atrial Fib  . Sinusitis, chronic 01/26/2016  . SMALL BOWEL OBSTRUCTION, HX OF 08/07/2008   Annotation: obstruction w/ adhesions led to partial colectomy Qualifier: Diagnosis of  By: Craige Cotta    . Swelling of hand 09/17/2012  . Syncope   . Tachycardia 02/03/2010   Qualifier: Diagnosis of  By: Via LPN, Jeani Hawking    . Ulcer 12/15/2011  . Urinary frequency 07/18/2014  . Vaginitis and vulvovaginitis 05/10/2013  . Vitamin D deficiency 05/05/2015  . Wears glasses     Past Surgical History:  Procedure Laterality Date  . ABDOMINAL HYSTERECTOMY  1987  . ANTERIOR CERVICAL DECOMP/DISCECTOMY FUSION  03-07-2002   dr elsner  @[facility]    C 4 -- 5  . APPENDECTOMY  1980  . BILATERAL SALPINGOOPHORECTOMY  07/24/2011   via Explor. Lap. w/ intraoperative perf. bowel repair  . BREAST ENHANCEMENT SURGERY Bilateral 1993  . BREAST IMPLANT REMOVAL Bilateral   . CARDIAC CATHETERIZATION  07-06-2003   dr Darnell Level brodie   normal coronaries and LVF  . CARDIAC ELECTROPHYSIOLOGY STUDY AND ABLATION  1996  and 1997  . CARDIOVASCULAR STRESS TEST  11-18-2015   dr Caryl Comes   normal nuclear study w/ no ischemia/   normal LV function and wall motion , ef 84%  . CARPAL TUNNEL RELEASE Right ?  . [place]  . COLON SURGERY    . CYSTO/  HYDRODISTENTION/  INSTILATION THERAPY  MULTIPLE  . ENTEROCUTANEOUS FISTULA CLOSURE  multiple   last one 2015 with small bowel resection  . EXPLORATORY LAPAROTOMY INCISIONAL VENTRAL HERNIA REPAIR / RESECTION SMALL BOWEL  11-09-2014   @[facility]   . LIPOMA EXCISION Right 09/16/2018   Procedure: EXCISION LIPOMA UPPER BACK;  Surgeon: Clovis Riley, MD;  Location: [facility];  Service: General;  Laterality: Right;  . MASTECTOMY Left 2006   w/ reconstruction on left (paget's disease)  and right breast augmentation  . TOTAL COLECTOMY  08-04-2002    @[facility]    AND CHOLECYSTECTOMY  (colonic inertia)  . TRANSTHORACIC ECHOCARDIOGRAM  02/04/2016   ef 60-65%,  grade 2 diastolic dysfunction/  mild MR  . VENA CAVA FILTER PLACEMENT  05/08/2005   @[facility]    greenfield (non-retrievable)  . WIDE EXCISION PERIRECTAL ABSCESSES  09-22-2005   @ [facility]    No outpatient medications have been marked as taking for the 01/31/20 encounter (Appointment) with Deboraha Sprang, MD.    Allergies  Allergen Reactions  . Nitrofurantoin Hives  . Crestor [Rosuvastatin Calcium] Other (See Comments)    Generalized cramps  . Bisacodyl Other (See Comments)    Makes patient feel like she is having cramps Makes patient feel like she is having cramps  . Clarithromycin Hives and Other (See Comments)    Other reaction(s): Other (See Comments) Cluster migraines  Cluster migraines  . Clarithromycin Other (See Comments)    Cluster migraines   . Clindamycin Hcl Hives  . Codeine Itching  . Monosodium Glutamate Other (See Comments)    Cluster migraines  Cluster migraines  . Scopolamine Hbr Other (See Comments)    Cluster migraines, impaired vision      Review of Systems negative except from HPI and PMH  Physical Exam There were no vitals taken for this visit. Well developed and  well nourished in no acute distress HENT normal E scleral and icterus clear Neck Supple JVP flat; carotids brisk and full Clear to ausculation {CARD RHYTHM:10874} ***Regular rate and rhythm, no murmurs gallops or rub Soft with active bowel sounds No clubbing cyanosis {Numbers; edema:17696} Edema Alert and oriented, grossly normal motor and sensory function Skin Warm and Dry  ECG ***  CrCl cannot be calculated (Patient's most recent lab result is older than the maximum 21 days allowed.).   Assessment and  Plan  Chest pain  Morbid obesity  IVC filter      Current medicines are reviewed at length with the patient today .  The patient does not*** have concerns regarding medicines.

## 2020-01-31 ENCOUNTER — Ambulatory Visit: Payer: Medicare Other | Admitting: Internal Medicine

## 2020-01-31 ENCOUNTER — Other Ambulatory Visit: Payer: Self-pay | Admitting: Physician Assistant

## 2020-01-31 NOTE — Telephone Encounter (Signed)
Follow up appt in March 2021 with Dr. Caryl Comes.  Advised pharmacy will need to keep appt for further refills

## 2020-02-16 ENCOUNTER — Telehealth: Payer: Self-pay

## 2020-02-16 NOTE — Telephone Encounter (Signed)
Pt [name] that she needs a refill on her Flexeril called in, she also stated Dr Moshe Cipro changed her Prescription to 3 a day   Please call in to [facility] in [place]

## 2020-02-19 ENCOUNTER — Other Ambulatory Visit: Payer: Self-pay

## 2020-02-19 MED ORDER — CYCLOBENZAPRINE HCL 10 MG PO TABS: 10.0000 mg | ORAL_TABLET | Freq: Three times a day (TID) | ORAL | 4 refills | Status: DC

## 2020-02-19 NOTE — Telephone Encounter (Signed)
Refill sent in

## 2020-02-21 ENCOUNTER — Other Ambulatory Visit: Payer: Self-pay | Admitting: Family Medicine

## 2020-03-13 ENCOUNTER — Encounter: Payer: Self-pay | Admitting: Family Medicine

## 2020-03-20 ENCOUNTER — Ambulatory Visit: Payer: Medicare Other | Admitting: Internal Medicine

## 2020-03-20 ENCOUNTER — Encounter: Payer: Self-pay | Admitting: Family Medicine

## 2020-03-21 ENCOUNTER — Telehealth: Payer: Self-pay | Admitting: *Deleted

## 2020-03-21 DIAGNOSIS — N644 Mastodynia: Secondary | ICD-10-CM

## 2020-03-21 NOTE — Telephone Encounter (Signed)
Diagnostic mammogram ordered for dr Moshe Cipro

## 2020-03-21 NOTE — Telephone Encounter (Signed)
Ordered placed pt notified

## 2020-03-26 ENCOUNTER — Ambulatory Visit: Payer: Medicare Other | Admitting: Family Medicine

## 2020-04-08 ENCOUNTER — Other Ambulatory Visit: Payer: Self-pay | Admitting: Family Medicine

## 2020-04-08 ENCOUNTER — Other Ambulatory Visit: Payer: Self-pay

## 2020-04-08 ENCOUNTER — Ambulatory Visit
Admission: RE | Admit: 2020-04-08 | Discharge: 2020-04-08 | Disposition: A | Discharge status: Home / Self Care | Payer: Medicare Other | Source: Ambulatory Visit | Attending: Family Medicine | Admitting: Family Medicine

## 2020-04-08 DIAGNOSIS — N644 Mastodynia: Secondary | ICD-10-CM

## 2020-05-02 ENCOUNTER — Other Ambulatory Visit: Payer: Self-pay | Admitting: Physician Assistant

## 2020-05-06 ENCOUNTER — Ambulatory Visit: Payer: Medicare Other | Admitting: Family Medicine

## 2020-05-28 ENCOUNTER — Other Ambulatory Visit: Payer: Self-pay | Admitting: *Deleted

## 2020-05-28 MED ORDER — OMEPRAZOLE 40 MG PO CPDR: 40.0000 mg | DELAYED_RELEASE_CAPSULE | Freq: Every day | ORAL | 3 refills | Status: DC

## 2020-06-11 ENCOUNTER — Other Ambulatory Visit: Payer: Self-pay

## 2020-06-11 ENCOUNTER — Telehealth: Payer: Self-pay

## 2020-06-11 MED ORDER — MECLIZINE HCL 25 MG PO TABS: 25.0000 mg | ORAL_TABLET | Freq: Three times a day (TID) | ORAL | 0 refills | Status: DC | PRN

## 2020-06-11 NOTE — Telephone Encounter (Signed)
[facility] is calling to get a refill of Meclizone for the pt , if this cant be refilled please call [facility]

## 2020-06-11 NOTE — Telephone Encounter (Signed)
Medication refilled

## 2020-06-25 ENCOUNTER — Encounter: Payer: Self-pay | Admitting: Family Medicine

## 2020-06-28 ENCOUNTER — Telehealth: Payer: Self-pay | Admitting: Family Medicine

## 2020-06-28 NOTE — Telephone Encounter (Signed)
Donna(nurse) from [facility] is requesting documents about the pt most recent office visit and of any information we have on her IVC filter.   Phone # [phone] Fax # [phone]

## 2020-06-28 NOTE — Telephone Encounter (Signed)
Pt scheduled for 07-08-20 in office

## 2020-06-28 NOTE — Telephone Encounter (Signed)
Notes faxed.

## 2020-07-01 ENCOUNTER — Telehealth: Payer: Self-pay | Admitting: Family Medicine

## 2020-07-01 ENCOUNTER — Other Ambulatory Visit: Payer: Self-pay | Admitting: Family Medicine

## 2020-07-01 DIAGNOSIS — H9203 Otalgia, bilateral: Secondary | ICD-10-CM

## 2020-07-01 DIAGNOSIS — R42 Dizziness and giddiness: Secondary | ICD-10-CM

## 2020-07-01 NOTE — Telephone Encounter (Signed)
I have entered a referral for Dr Benjamine Mola asap re vertigo and ear pain, can you try to get this appointment made and let pt know please

## 2020-07-03 ENCOUNTER — Telehealth: Payer: Self-pay | Admitting: Family Medicine

## 2020-07-03 NOTE — Telephone Encounter (Signed)
Dr Benjamine Mola will reach out to the pt to schedule

## 2020-07-08 ENCOUNTER — Encounter: Payer: Self-pay | Admitting: Family Medicine

## 2020-07-08 ENCOUNTER — Ambulatory Visit: Payer: Medicare Other | Admitting: Family Medicine

## 2020-07-08 ENCOUNTER — Other Ambulatory Visit: Payer: Self-pay

## 2020-07-08 VITALS — BP 104/71 | HR 90 | Resp 16 | Ht 64.0 in | Wt 232.0 lb

## 2020-07-08 DIAGNOSIS — M79604 Pain in right leg: Secondary | ICD-10-CM

## 2020-07-08 DIAGNOSIS — E8940 Asymptomatic postprocedural ovarian failure: Secondary | ICD-10-CM

## 2020-07-08 DIAGNOSIS — M549 Dorsalgia, unspecified: Secondary | ICD-10-CM

## 2020-07-08 DIAGNOSIS — E038 Other specified hypothyroidism: Secondary | ICD-10-CM

## 2020-07-08 DIAGNOSIS — E559 Vitamin D deficiency, unspecified: Secondary | ICD-10-CM

## 2020-07-08 DIAGNOSIS — E039 Hypothyroidism, unspecified: Secondary | ICD-10-CM

## 2020-07-08 DIAGNOSIS — M545 Low back pain, unspecified: Secondary | ICD-10-CM

## 2020-07-08 DIAGNOSIS — Z95828 Presence of other vascular implants and grafts: Secondary | ICD-10-CM

## 2020-07-08 DIAGNOSIS — E8881 Metabolic syndrome: Secondary | ICD-10-CM

## 2020-07-08 DIAGNOSIS — D509 Iron deficiency anemia, unspecified: Secondary | ICD-10-CM

## 2020-07-08 DIAGNOSIS — R790 Abnormal level of blood mineral: Secondary | ICD-10-CM

## 2020-07-08 DIAGNOSIS — Z7189 Other specified counseling: Secondary | ICD-10-CM

## 2020-07-08 DIAGNOSIS — E785 Hyperlipidemia, unspecified: Secondary | ICD-10-CM

## 2020-07-08 MED ORDER — METHYLPREDNISOLONE ACETATE 80 MG/ML IJ SUSP
80.0000 mg | Freq: Once | INTRAMUSCULAR | Status: AC
  Administered 2020-07-08: 80 mg via INTRAMUSCULAR

## 2020-07-08 MED ORDER — KETOROLAC TROMETHAMINE 60 MG/2ML IM SOLN
60.0000 mg | Freq: Once | INTRAMUSCULAR | Status: AC
  Administered 2020-07-08: 60 mg via INTRAMUSCULAR

## 2020-07-08 NOTE — Patient Instructions (Addendum)
Annual exam with pap in 3  months, call if you need me before  Wellness with MD in 4 months  I will f/u on colon screening ( [place])  Fasting labs [place] CBC, lipid, cmp and EGFr, TSH , vit D, iron , ferritin, Creatinine Kinase, B12 level, magnesium  Toradol 60 mg IM and depo medrol 80 mg IM in the office today for back pain  Letter will be prepared and faxed  Pls send info on Covid vaccines  Thanks for choosing [facility], we consider it a privelige to serve you.

## 2020-07-08 NOTE — Progress Notes (Signed)
[name]     MRN: [id number]      DOB: [birth year]   HPI [name] is here for letter for IVC filter removal, she has been working on this for over 2 years. Now believes [facility] will proceed , has not physically seen the MD, her understanding is [place] she will have pre op the day before and the procedure the next UNFORTUNATELY , pt has had several major , even life threatening experiences with surgery , so I do believe that the overnight eval is will be insufficient.  Has been talking to Dr [facility] secretary, an she needs an appt to proceed. Hence here for a letter to Dr Earnest Bailey with brief medical history around current iVC Placed in 2006.Had PE in 1997, and wason coumadinuntil 2006, filter placed then when breast cancer diagnosed] Was told in 2015 at [facility] trhather filter contribute ass to uncontrolled pain states tilted inside vena cava, one leg broken off in spine with spurs, also a 2nd seen on Another hooked to kidney on right side. Having uncontrolled nerve pain which is causing nausea despite pain management  Which she wants off Pain localized 50 c piece, keeps her up, right buttock, radiated from back to abdomen  ROS C/o increased and uncontrolled back pain , requests injections for same which she has ahd in the past an which help short term.   PE  BP 104/71   Pulse 90   Resp 16   Ht 5\' 4"  (1.626 m)   Wt 232 lb (105.2 kg)   SpO2 94%   BMI 39.82 kg/m   Patient alert and oriented and in no cardiopulmonary distress.Pt in pain  .   Ext: No edema  RD:EYCXKGYJE lumbar  ROM spine, right  hip and knee.  Skin: Intact, no ulcerations or rash noted.  Psych: Good eye contact, normal affect. Memory intact not anxious or depressed appearing.  CNS: CN 2-12 intact, power,  normal throughout.no focal deficits noted.   Assessment & Plan  Back pain with radiation Uncontrolled.Toradol and depo medrol administered IM in the office ,  S/P insertion of  IVC (inferior vena caval) filter Reports chronic uncontrolled localized right sacral pain which she attributes to displacement of the filter with parts abbuting he kidney and a vessel. Has been trying for 2 years to have this removed. Here for letter of referral to MD at [facility] to review her case, states hse has been in touch with his Network engineer. Brief summary of her history as it relates to the IVC is recorded and will be sent to MD per pt request  Educated About Covid-19 Virus Infection Has had both vaccines, needs to send in documentation  Postsurgical menopause Premarin cream prescribed for sparing use, has upcoming pap  Morbid obesity ([place])  Patient re-educated about  the importance of commitment to a  minimum of 150 minutes of exercise per week as able.  The importance of healthy food choices with portion control discussed, as well as eating regularly and within a 12 hour window most days. The need to choose "clean , green" food 50 to 75% of the time is discussed, as well as to make water the primary drink and set a goal of 64 ounces water daily.    Weight /BMI 07/08/2020 01/03/2020 10/17/2019  WEIGHT 232 lb 223 lb 223 lb 1.3 oz  HEIGHT 5\' 4"  5' 4.5" 5' 4.5"  BMI 39.82 kg/m2 37.69 kg/m2 37.7 kg/m2  Some encounter  information is confidential and restricted. Go to Review Flowsheets activity to see all data.

## 2020-07-12 ENCOUNTER — Encounter: Payer: Self-pay | Admitting: Family Medicine

## 2020-07-12 MED ORDER — ESTROGENS, CONJUGATED 0.625 MG/GM VA CREA: TOPICAL_CREAM | VAGINAL | 1 refills | Status: DC

## 2020-07-12 NOTE — Assessment & Plan Note (Signed)
Premarin cream prescribed for sparing use, has upcoming pap

## 2020-07-12 NOTE — Assessment & Plan Note (Signed)
Has had both vaccines, needs to send in documentation

## 2020-07-12 NOTE — Assessment & Plan Note (Signed)
Uncontrolled.Toradol and depo medrol administered IM in the office , 

## 2020-07-12 NOTE — Assessment & Plan Note (Signed)
Reports chronic uncontrolled localized right sacral pain which she attributes to displacement of the filter with parts abbuting he kidney and a vessel. Has been trying for 2 years to have this removed. Here for letter of referral to MD at [facility] to review her case, states hse has been in touch with his Network engineer. Brief summary of her history as it relates to the IVC is recorded and will be sent to MD per pt request

## 2020-07-12 NOTE — Assessment & Plan Note (Signed)
  Patient re-educated about  the importance of commitment to a  minimum of 150 minutes of exercise per week as able.  The importance of healthy food choices with portion control discussed, as well as eating regularly and within a 12 hour window most days. The need to choose "clean , green" food 50 to 75% of the time is discussed, as well as to make water the primary drink and set a goal of 64 ounces water daily.    Weight /BMI 07/08/2020 01/03/2020 10/17/2019  WEIGHT 232 lb 223 lb 223 lb 1.3 oz  HEIGHT 5\' 4"  5' 4.5" 5' 4.5"  BMI 39.82 kg/m2 37.69 kg/m2 37.7 kg/m2  Some encounter information is confidential and restricted. Go to Review Flowsheets activity to see all data.

## 2020-07-17 ENCOUNTER — Encounter: Payer: Self-pay | Admitting: Family Medicine

## 2020-07-17 ENCOUNTER — Other Ambulatory Visit: Payer: Self-pay | Admitting: Family Medicine

## 2020-07-17 MED ORDER — EZETIMIBE 10 MG PO TABS
10.0000 mg | ORAL_TABLET | Freq: Every day | ORAL | 3 refills | Status: DC
Start: 2020-07-17 — End: 2021-01-10

## 2020-07-17 NOTE — Progress Notes (Signed)
zetia 

## 2020-07-20 ENCOUNTER — Other Ambulatory Visit: Payer: Self-pay | Admitting: Family Medicine

## 2020-07-23 ENCOUNTER — Telehealth: Payer: Self-pay

## 2020-07-23 ENCOUNTER — Other Ambulatory Visit: Payer: Self-pay | Admitting: Family Medicine

## 2020-07-23 ENCOUNTER — Telehealth: Payer: Self-pay | Admitting: Family Medicine

## 2020-07-23 NOTE — Telephone Encounter (Signed)
Patient's alprazolam has been faxed to pharmacy. She lost the zetia. Do you want her to just take pravastatin for now. Can not refill until September.

## 2020-07-23 NOTE — Telephone Encounter (Signed)
conjugated estrogens (PREMARIN) --please send in the other cream the Estradol as it is cheaper

## 2020-07-23 NOTE — Telephone Encounter (Signed)
Zetia was re sent on 7/21 and I had messaged her on that already, pleease re send if needed, thanks

## 2020-07-23 NOTE — Telephone Encounter (Signed)
Patient needs refill on aprazolam and also lost medication (espetide?) Says insurance refuses to cover refill at copay cost wants to know if she can be prescribed something else until time of her refill 08/28/20. Can reach patient at [phone] or cell [phone]

## 2020-07-24 ENCOUNTER — Telehealth: Payer: Self-pay

## 2020-07-24 ENCOUNTER — Other Ambulatory Visit: Payer: Self-pay | Admitting: Family Medicine

## 2020-07-24 MED ORDER — ESTRADIOL 0.1 MG/GM VA CREA
1.0000 | TOPICAL_CREAM | Freq: Every day | VAGINAL | 12 refills | Status: DC
Start: 2020-07-24 — End: 2021-01-15

## 2020-07-24 NOTE — Telephone Encounter (Signed)
Left message for patient that script has be refaxed. Confirmation was received.

## 2020-07-24 NOTE — Telephone Encounter (Signed)
Pt LVM that her medication is not at [facility], please call

## 2020-07-24 NOTE — Telephone Encounter (Signed)
Pls let her know cream is sent

## 2020-07-24 NOTE — Telephone Encounter (Signed)
Left generic message stating that provider had sent in prescription for other cream as prescribed.

## 2020-07-24 NOTE — Telephone Encounter (Signed)
Patient is asking for you to change her Premarin to Estradiol due to the price of Premarin being over $400 even with [facility]. Estradiol she can get for $35-45.

## 2020-07-24 NOTE — Progress Notes (Signed)
Estradiol cream

## 2020-08-06 ENCOUNTER — Other Ambulatory Visit: Payer: Self-pay | Admitting: Internal Medicine

## 2020-08-06 MED ORDER — METOPROLOL TARTRATE 25 MG PO TABS: 25.0000 mg | ORAL_TABLET | Freq: Two times a day (BID) | ORAL | 0 refills | Status: DC | PRN

## 2020-08-14 ENCOUNTER — Other Ambulatory Visit: Payer: Self-pay | Admitting: Family Medicine

## 2020-08-15 ENCOUNTER — Encounter: Payer: Self-pay | Admitting: Family Medicine

## 2020-08-15 DIAGNOSIS — R21 Rash and other nonspecific skin eruption: Secondary | ICD-10-CM | POA: Insufficient documentation

## 2020-08-16 ENCOUNTER — Ambulatory Visit: Payer: Medicare Other | Admitting: Physician Assistant

## 2020-08-16 ENCOUNTER — Other Ambulatory Visit: Payer: Self-pay

## 2020-08-16 ENCOUNTER — Telehealth: Payer: Self-pay | Admitting: Radiology

## 2020-08-16 VITALS — BP 106/68 | HR 84 | Ht 64.0 in | Wt 223.0 lb

## 2020-08-16 DIAGNOSIS — R079 Chest pain, unspecified: Secondary | ICD-10-CM | POA: Diagnosis not present

## 2020-08-16 DIAGNOSIS — R002 Palpitations: Secondary | ICD-10-CM | POA: Diagnosis not present

## 2020-08-16 DIAGNOSIS — R42 Dizziness and giddiness: Secondary | ICD-10-CM

## 2020-08-16 NOTE — Patient Instructions (Signed)
Medication Instructions:   Your physician recommends that you continue on your current medications as directed. Please refer to the Current Medication list given to you today.   *If you need a refill on your cardiac medications before your next appointment, please call your pharmacy*   Lab Work: [place]    If you have labs (blood work) drawn today and your tests are completely normal, you will receive your results only by: Marland Kitchen MyChart Message (if you have MyChart) OR . A paper copy in the mail If you have any lab test that is abnormal or we need to change your treatment, we will call you to review the results.   Testing/Procedures:  Your physician has requested that you have an echocardiogram. Echocardiography is a painless test that uses sound waves to create images of your heart. It provides your doctor with information about the size and shape of your heart and how well your heart's chambers and valves are working. This procedure takes approximately one hour. There are no restrictions for this procedure.  Your physician has recommended that you wear an event monitor. Event monitors are medical devices that record the heart's electrical activity. Doctors most often [place] these monitors to diagnose arrhythmias. Arrhythmias are problems with the speed or rhythm of the heartbeat. The monitor is a small, portable device. You can wear one while you do your normal daily activities. This is usually used to diagnose what is causing palpitations/syncope (passing out).    Follow-Up: At [facility], you and your health needs are our priority.  As part of our continuing mission to provide you with exceptional heart care, we have created designated Provider Care Teams.  These Care Teams include your primary Cardiologist (physician) and Advanced Practice Providers (APPs -  Physician Assistants and Nurse Practitioners) who all work together to provide you with the care you need, when you need  it.  We recommend signing up for the patient portal called "MyChart".  Sign up information is provided on this After Visit Summary.  MyChart is used to connect with patients for Virtual Visits (Telemedicine).  Patients are able to view lab/test results, encounter notes, upcoming appointments, etc.  Non-urgent messages can be sent to your provider as well.   To learn more about what you can do with MyChart, go to [email or web address].    Your next appointment:  AS SCHEDULED    Other Instructions  ZIO XT- Long Term Monitor Instructions   Your physician has requested you wear your ZIO patch monitor___14____days.   This is a single patch monitor.  [employer] supplies one patch monitor per enrollment.  Additional stickers are not available.   Please do not apply patch if you will be having a Nuclear Stress Test, Echocardiogram, Cardiac CT, MRI, or Chest Xray during the time frame you would be wearing the monitor. The patch cannot be worn during these tests.  You cannot remove and re-apply the ZIO XT patch monitor.   Your ZIO patch monitor will be sent USPS Priority mail from [facility] directly to your home address. The monitor may also be mailed to a PO BOX if home delivery is not available.   It may take 3-5 days to receive your monitor after you have been enrolled.   Once you have received you monitor, please review enclosed instructions.  Your monitor has already been registered assigning a specific monitor serial # to you.   Applying the monitor   Shave hair from upper left chest.  Hold abrader disc by orange tab.  Rub abrader in 40 strokes over left upper chest as indicated in your monitor instructions.   Clean area with 4 enclosed alcohol pads .  Use all pads to assure are is cleaned thoroughly.  Let dry.   Apply patch as indicated in monitor instructions.  Patch will be place under collarbone on left side of chest with arrow pointing upward.   Rub patch adhesive wings  for 2 minutes.Remove white label marked "1".  Remove white label marked "2".  Rub patch adhesive wings for 2 additional minutes.   While looking in a mirror, press and release button in center of patch.  A small green light will flash 3-4 times .  This will be your only indicator the monitor has been turned on.     Do not shower for the first 24 hours.  You may shower after the first 24 hours.   Press button if you feel a symptom. You will hear a small click.  Record Date, Time and Symptom in the Patient Log Book.   When you are ready to remove patch, follow instructions on last 2 pages of Patient Log Book.  Stick patch monitor onto last page of Patient Log Book.   Place Patient Log Book in [place] box.  Use locking tab on box and tape box closed securely.  The [employer] has [employer] on it.  Please place in mailbox as soon as possible.  Your physician should have your test results approximately 7 days after the monitor has been mailed back to [facility].   Call [place] at [phone] if you have questions regarding your ZIO XT patch monitor.  Call them immediately if you see an orange light blinking on your monitor.   If your monitor falls off in less than 4 days contact our Monitor department at [phone].  If your monitor becomes loose or falls off after 4 days call [employer] at [phone] for suggestions on securing your monitor.

## 2020-08-16 NOTE — Progress Notes (Signed)
Cardiology Office Note Date:  08/16/2020  Patient ID:  [name], [name] 08/29/1961, MRN [id number] PCP:  Fayrene Helper, MD  Cardiologist:  Dr. Caryl Comes    Chief Complaint:    pre-op, CP, palpitations  History of Present Illness: [name] is a 59 y.o. female with history of  PE 1990s s/p IVC filter 2006, SVT, ?atrial fib/flutter, HLD, thyroid disease, GERD, anxiety, depression, obesity, stroke, fibromyalgia, migraines, IC, breast CA, syncope, ovarian CA misdiagnoses with multiple GI complications.  She was last seen by [facility] care Nov 2019 by D. Dunn, PA for c/o CP, palps, and need for pre-op clearance. Her note very thorough, elicited h/o "reports that in 1996 she had 1 ablation and in 1997 she had 2 ablations. She recalls her HR being in the 280-340 range and she had to be personally escorted to [facility] by Dr. Caryl Comes for the second ablation there. Dr. Caryl Comes and Dr. Izell Claude remote notes available in Epic back to 2010 do not mention afib/flutter, only history of "tachycardia ablation." She recalls being told she had SVT and a-fi. Records are limited due to the remote nature. Someone else has entered information in [place] below prior to our encounter. She was re-evaluated by EP in 2016-2017 for atypical chest pain and episode of syncope. The syncopal spell was prolonged and not felt to be arrhythmic in nature, felt instead to be vasovagal. 10/2015 nuclear stress test did not show any ischemia, small defect of mild severity present in the apical anterior location most consistent with breast attenuation, EF 84%. 2D echo 01/2016 EF 60-65%, grade 2 DD." "She has been through a series of horrific medical complications, the most significant includes a misdiagnosis of ovarian cancer that turned out to be an ovarian cyst in 2012. She received a hysterectomy and apparently that procedure went awry, complicated by dehiscence/evisceration of her organs. She received multiple GI surgeries  and at one point had renal failure and sepsis. She had a PE in the late 1990s and during treatment for breast cancer hemorrhaged so IVC filter was placed in 2006. Since that time she's learned that her Greenfield filter has eroded through her vena cava and is beginning to ossify in her spine as well. She did extensive research on a surgeon to help and therefore will be seeking care in [place] to have this removed. She states this is planned for December but she thinks it will likely be sometime later than that as she has some things she needs to accomplish before then. She acknowledges PTSD as a result of all of her catastrophic medical misadventures"  Planned for monitor and coronary CT CT noted Ca++ zero, no CAD, permanence of central pulm arteries  TODAY She was scheduled as an over due office visit though comes with numerous symptoms and also states that she is scheduled for her IVC filter surgery Sept 10, previously canceled 2/2 COVID restrictions last year, and no longer will be in [place] the MD is now practicing in [place].  She has a number of issues/symptoms she wants to discuss today and is a bit hard to follow, tends to roll from one symptom to another prior to completing our discussion of the prior.  She mentions mostly being limited by her severe back pain this she reports has been found 2/2 to the IVF erosin as discussed above.  This has caused terrible pain in her legs, it seems 2/2 the back and perhaps neuropathic.  She saw her hematologist yesterday and  had "numerous tubes of blood" drawn today to look into rash on her legs.  This seems like it comes/goes, today appears more of a raised, fine rash, though yesterday she says looked different and was on her forearms and wrists.  She mentions she has had similar rash to her R chest and there was some discussion may be associated with her breast implant. She will follow up with hematology for this  She has chronic R arm aching pain and  swelling since DVT she had there back in 2012, she says she was told to ask her cardiologist about compression wear that may be helpful, she notices chronic though intermittent reddish/blue discoloration to her palms/fingers, also dating back to 2012.  Reports her breast cancer and surgery on the left, has a breast implant on the right to even her out I have recommended that she see her PMD for this  She continues to have CP and palpitations.  This dates back to her visit with [facility] in 2019 but seems to be excalating in frequency at least. The symptoms are connected, the palpitations seem provoked by the CP.  It is seems to start under her R breast radiates or shoots up to her neck/jaw and then down her R arm sopping abruptly at her elbow. This provokes a quivering sometimes slamming heart beat that makes her feel very unsettled, perhaps dizzy. She mentions when Dr. Caryl Comes did her heart cath procedure that found her PE (and saved her life) he told her she had a spasm of some kind. She mentions she has a large hiatall hernia It is random, can happen sitting, standing, supine, not particularly exertional, she is not overly ambulatory given her terrible back pain, but exertion is not a clear trigger Drinking cold water helps but only momentary Lasts 1-2 minutes in duration   No near syncope or syncope  She was told that after her OVC filter is removed she will need to go back on anticoagulation, way back she says was warfarin, would like Dr. Olin Pia input on a/c choices and recommendations.   Past Medical History:  Diagnosis Date  . Abdominal hernia 08/14/2019  . Abdominal pain 06/06/2019  . Acute bronchitis 05/01/2013  . Acute cystitis 06/09/2010   Qualifier: Diagnosis of  By: Cori Razor LPN, Brandi    . Acute renal insufficiency 08/28/2011  . Anxiety   . Anxiety state 03/16/2008   Qualifier: Diagnosis of  By: Dierdre Harness    . Back pain with radiation 07/17/2014  . Bilateral hand pain 07/27/2016  .  Carpal tunnel syndrome   . Chronic constipation   . Chronic pain syndrome    followed by [facility]---  back  . Depression   . Dermatitis 12/15/2011  . Educated about COVID-19 virus infection 05/14/2019  . Fall at home 01/26/2016  . Family history of adverse reaction to anesthesia    MOTHER--- PONV  . Fatigue 09/17/2012  . Fibromyalgia   . FIBROMYALGIA 03/16/2008   Qualifier: Diagnosis of  By: Dierdre Harness    . GERD (gastroesophageal reflux disease)   . Headache disorder 01/16/2013  . Headache syndrome 01/26/2016  . Hip pain, right 07/08/2019  . History of MRSA infection    lip abscess  . History of ovarian cyst 06/2011   s/p  BSO  . History of pulmonary embolus (PE) 1997   post EP with ablation pulmonary veouns for SVT/ Atrial Fib.  . History of supraventricular tachycardia    s/p  ablation 1996  and 1997  by dr Caryl Comes  . History of TIA (transient ischemic attack) 1997   post op EP ablation PE  . Hyperlipidemia   . Hyperlipidemia LDL goal <100 03/16/2008   Qualifier: Diagnosis of  By: Dierdre Harness    . Hypothyroidism    followed by pcp  . Incisional hernia   . Insomnia 12/15/2011  . Intermittent palpitations 08/22/2017  . Interstitial cystitis    09-13-2018   per pt last flare-up  May 2019 (followed by pcp)  . INTERSTITIAL CYSTITIS 02/17/2011   Qualifier: Diagnosis of  By: Moshe Cipro MD, Joycelyn Schmid    . Iron deficiency anemia    09-13-2018  PER PT STABLE  . Irregular heart rate 07/25/2013  . Lipoma of back    upper  . Low back pain radiating to right leg 07/04/2019  . Low ferritin level 06/14/2014  . MDD (major depressive disorder), single episode, in full remission ([place]) 10/27/2017  . Medically noncompliant 02/28/2015   Multiple missed appointments, both follow-up appointments and lab appointments.   . Metabolic syndrome X 04/05/8118   Qualifier: Diagnosis of  By: Moshe Cipro MD, Margaret  hBA1c is 5.8 in 02/2013   . Migraines   . Morbid obesity ([place]) 03/27/2013  . Muscle spasm  01/03/2020  . Nausea alone 07/17/2014  . NECK PAIN, CHRONIC 03/16/2008   Qualifier: Diagnosis of  By: Dierdre Harness    . Normal coronary arteries    a. by CT 12/2018.  . Obesity 03/16/2008   Qualifier: Diagnosis of  By: Dierdre Harness    . Oral ulceration 08/23/2014   Presented at 06/14/2014 visit   . Other malaise and fatigue 03/16/2008   Centricity Description: FATIGUE, CHRONIC Qualifier: Diagnosis of  By: Dierdre Harness   Centricity Description: FATIGUE Qualifier: Diagnosis of  By: Christy Sartorius, [place]    . OVARIAN CYST 12/26/2009   Qualifier: Diagnosis of  By: Moshe Cipro MD, Joycelyn Schmid    . Paget disease of breast, left ([place])   . Paget's disease of breast, left ([place]) 03/16/2008   Qualifier: Diagnosis of  By: Dierdre Harness  Left diagnosed in 2006 F/h breast cancer x 15 family members  . PONV (postoperative nausea and vomiting)    SEVERE  . Postsurgical menopause 11/13/2011  . Presence of IVC filter 06/06/2019  . PSVT (paroxysmal supraventricular tachycardia) ([place])    [place]  . Recurrent oral herpes simplex infection 10/13/2017  . ROM (right otitis media) 01/23/2013  . S/P insertion of IVC (inferior vena caval) filter 05/08/2005   greenfield (non-retrievable)  /  dx 2019  a leg of filter is protruding thru the vena cava in to right L2 vertebral body (09-13-2018  per pt having surgery to remove filter in [place])  . S/P radiofrequency ablation operation for arrhythmia 1996   1996  and 1997,   SVT and Atrial Fib  . Sinusitis, chronic 01/26/2016  . SMALL BOWEL OBSTRUCTION, HX OF 08/07/2008   Annotation: obstruction w/ adhesions led to partial colectomy Qualifier: Diagnosis of  By: Craige Cotta    . Swelling of hand 09/17/2012  . Syncope   . Tachycardia 02/03/2010   Qualifier: Diagnosis of  By: Via LPN, Jeani Hawking    . Ulcer 12/15/2011  . Urinary frequency 07/18/2014  . Vaginitis and vulvovaginitis 05/10/2013  . Vitamin D deficiency 05/05/2015  . Wears glasses     Past Surgical History:    Procedure Laterality Date  . ABDOMINAL HYSTERECTOMY  1987  . ANTERIOR CERVICAL DECOMP/DISCECTOMY FUSION  03-07-2002   dr elsner  @[facility]    C 4 -- 5  . APPENDECTOMY  1980  . AUGMENTATION MAMMAPLASTY Right 2006  . BILATERAL SALPINGOOPHORECTOMY  07/24/2011   via Explor. Lap. w/ intraoperative perf. bowel repair  . BREAST BIOPSY Right 2019   benign  . BREAST ENHANCEMENT SURGERY Bilateral 1993  . BREAST IMPLANT REMOVAL Bilateral   . CARDIAC CATHETERIZATION  07-06-2003   dr Darnell Level brodie   normal coronaries and LVF  . CARDIAC ELECTROPHYSIOLOGY STUDY AND ABLATION  1996  and 1997  . CARDIOVASCULAR STRESS TEST  11-18-2015   dr Caryl Comes   normal nuclear study w/ no ischemia/  normal LV function and wall motion , ef 84%  . CARPAL TUNNEL RELEASE Right ?  . [place]  . COLON SURGERY    . CYSTO/  HYDRODISTENTION/  INSTILATION THERAPY  MULTIPLE  . ENTEROCUTANEOUS FISTULA CLOSURE  multiple   last one 2015 with small bowel resection  . EXPLORATORY LAPAROTOMY INCISIONAL VENTRAL HERNIA REPAIR / RESECTION SMALL BOWEL  11-09-2014   @[facility]   . LIPOMA EXCISION Right 09/16/2018   Procedure: EXCISION LIPOMA UPPER BACK;  Surgeon: Clovis Riley, MD;  Location: [facility];  Service: General;  Laterality: Right;  . MASTECTOMY Left 2006   w/ reconstruction on left (paget's disease)  and right breast augmentation  . TOTAL COLECTOMY  08-04-2002    @[facility]    AND CHOLECYSTECTOMY  (colonic inertia)  . TRANSTHORACIC ECHOCARDIOGRAM  02/04/2016   ef 60-65%,  grade 2 diastolic dysfunction/  mild MR  . VENA CAVA FILTER PLACEMENT  05/08/2005   @[facility]    greenfield (non-retrievable)  . WIDE EXCISION PERIRECTAL ABSCESSES  09-22-2005   @ [facility]    Current Outpatient Medications  Medication Sig Dispense Refill  . ALPRAZolam (XANAX) 1 MG tablet TAKE 1 TABLET(1 MG) BY MOUTH TWICE DAILY AS NEEDED FOR ANXIETY 60 tablet 0  . aspirin 81 MG chewable tablet Chew 81 mg by mouth daily.    .  butalbital-acetaminophen-caffeine (FIORICET) 50-325-40 MG tablet Take 1 tablet by mouth as directed.    . conjugated estrogens (PREMARIN) vaginal cream Place fingertip amount intravaginally 2 to 3 times per week 42.5 g 1  . cyanocobalamin (,VITAMIN B-12,) 1000 MCG/ML injection INJECT 1 ML INTO THE MUSCLE EVERY 30 DAYS.  0  . cyclobenzaprine (FLEXERIL) 10 MG tablet TAKE 1 TABLET BY MOUTH TWICE DAILY AS NEEDED FOR MUSCLE SPASMS 180 tablet 0  . cycloSPORINE (RESTASIS) 0.05 % ophthalmic emulsion Place 1 drop into both eyes 2 (two) times daily.    Marland Kitchen estradiol (ESTRACE VAGINAL) 0.1 MG/GM vaginal cream Place 1 Applicatorful vaginally at bedtime. 42.5 g 12  . ezetimibe (ZETIA) 10 MG tablet Take 1 tablet (10 mg total) by mouth daily. 90 tablet 3  . fluticasone (FLONASE) 50 MCG/ACT nasal spray Place 2 sprays into both nostrils as needed for allergies or rhinitis.    . Glycerin, Laxative, 80.7 % SUPP Place 1 suppository rectally at bedtime as needed (for constipation).     Marland Kitchen HYDROmorphone (DILAUDID) 4 MG tablet One tablet twice daily 20 tablet 0  . levothyroxine (SYNTHROID) 125 MCG tablet TAKE 1 TABLET(125 MCG) BY MOUTH DAILY BEFORE BREAKFAST 90 tablet 1  . MAGNESIUM-OXIDE 400 (241.3 Mg) MG tablet TAKE 1 TABLET(400 MG) BY MOUTH TWICE DAILY 180 tablet 0  . meclizine (ANTIVERT) 25 MG tablet Take 1 tablet (25 mg total) by mouth 3 (three) times daily as needed for dizziness. 30 tablet 0  . metoprolol  tartrate (LOPRESSOR) 25 MG tablet Take 1 tablet (25 mg total) by mouth 2 (two) times daily as needed (palpitations). Please make overdue appt with Dr. Caryl Comes before anymore refills. 1st attempt 60 tablet 0  . Multiple Vitamins-Minerals (ICAPS AREDS 2) CAPS Take by mouth at bedtime.    Marland Kitchen omeprazole (PRILOSEC) 40 MG capsule Take 1 capsule (40 mg total) by mouth daily. 180 capsule 3  . phentermine (ADIPEX-P) 37.5 MG tablet Take 18.75 mg by mouth daily before breakfast.    . potassium chloride (K-DUR) 10 MEQ tablet Take 1  tablet (10 mEq total) by mouth daily. 30 tablet 2  . pravastatin (PRAVACHOL) 40 MG tablet TAKE 1 TABLET(40 MG) BY MOUTH DAILY 90 tablet 3  . promethazine (PHENERGAN) 25 MG tablet Take 1 tablet by mouth as needed.    . senna-docusate (SENOKOT-S) 8.6-50 MG per tablet Take 2 tablets by mouth 3 (three) times daily. constipation     . Vitamin D, Ergocalciferol, (DRISDOL) 50000 units CAPS capsule Take 50,000 Units by mouth every 7 (seven) days.     . DULoxetine (CYMBALTA) 30 MG capsule Take 1 capsule (30 mg total) by mouth 2 (two) times daily. 180 capsule 1  . rosuvastatin (CRESTOR) 5 MG tablet Take 1 tablet (5 mg total) by mouth daily. 90 tablet 3   No current facility-administered medications for this visit.    Allergies:   Nitrofurantoin, Crestor [rosuvastatin calcium], Bisacodyl, Clarithromycin, Clarithromycin, Clindamycin hcl, Codeine, Monosodium glutamate, and Scopolamine hbr   Social History:  The patient  reports that she has never smoked. She has never used smokeless tobacco. She reports that she does not drink alcohol and does not use drugs.   Family History:  [name] family history includes Alcohol abuse in her father; Bone cancer in her maternal grandfather; Breast cancer in her cousin and maternal aunt; Cancer in her maternal uncle; Colon cancer in her maternal aunt; Diabetes in her mother; Heart disease in her father and mother; Hyperlipidemia in her father; Hypertension in her father and mother; Ovarian cancer (age of onset: 49) in her cousin; Prostate cancer in her maternal uncle.  ROS:  Please see the history of present illness. All other systems are reviewed and otherwise negative.   PHYSICAL EXAM:  VS:  BP 106/68   Pulse 84   Ht 5\' 4"  (1.626 m)   Wt 223 lb (101.2 kg)   BMI 38.28 kg/m  BMI: Body mass index is 38.28 kg/m. Well nourished, well developed, in no acute distress  HEENT: normocephalic, atraumatic  Neck: no JVD, carotid bruits or masses Cardiac:  RRR; no  significant murmurs, no rubs, or gallops Lungs:  CTA b/l, no wheezing, rhonchi or rales  Abd: soft, nontender, obese MS: no deformity or atrophy Ext: no edema  Skin: warm and dry, very light, scattered, somewhat raised rash b/l leg, I do not appreciate on her arms Neuro:  No gross deficits appreciated Psych: euthymic mood, full affect   EKG:  Done today and reviewed by myself shows  SR 84bpm, nonspecific ST/T flattening V2-3, not significantly different then prior  01/19/2019 Study Conclusions  - Left ventricle: The cavity size was normal. Systolic function was  normal. The estimated ejection fraction was in the range of 60%  to 65%. Wall motion was normal; there were no regional wall  motion abnormalities. Doppler parameters are consistent with  abnormal left ventricular relaxation (grade 1 diastolic  dysfunction).  Pulmonary artery:  The main pulmonary artery was normal-sized.  Systolic pressure was  within the normal range.  Dec 2019 Monitor Duration: 24d Findings Symptoms of chest pain assoc with sinus rhythm Symptoms of palpitations assoc with sinus rhtyhm and occasionally with ectopic beats   No serious arrhythmias noted     01/11/2019: Coronary CT IMPRESSION: 1. Coronary calcium score of 0. This was 0 percentile for age and sex matched control. 2. Normal coronary origin with left dominance. 3. No evidence of CAD. 4. Mildly dilated pulmonary artery measuring 33 mm suggestive of pulmonary hypertension.  IMPRESSION: Slight prominence of the central pulmonary arteries may reflect early pulmonary arterial hypertension. No acute extra cardiac abnormality.   Recent Labs: No results found for requested labs within last 8760 hours.  No results found for requested labs within last 8760 hours.   CrCl cannot be calculated (Patient's most recent lab result is older than the maximum 21 days allowed.).   Wt Readings from Last 3 Encounters:  08/16/20 223 lb  (101.2 kg)  07/08/20 232 lb (105.2 kg)  01/03/20 223 lb (101.2 kg)     Other studies reviewed: Additional studies/records reviewed today include: summarized above  ASSESSMENT AND PLAN:   1. Unclear hx of tachyarrhythmia and prior ablations 2. CP 3. palpitations  I disussed given her symptoms go back to her last visit in 2019 and her testing done then with no CAD or arrhythmias and I felt confident that her symptom was not cardiac/coronary in etiology, ?supsect GI, musculoskeletal or both.  She though is certain that something has changed cardiac-wise and prior to her having the upcoming surgery is worried about her heart.  She has not seen Dr. Caryl Comes in many, many years, looks like she rescheduled and then no showed to her f/u made for him after last visit. Given her very complicated, long running medical history and multiple complaints, I discussed that I felt he should be involved in her evaluation and ultimate surgical risk assessment.  She is very appreciate and will make sure to make her appts for echo and follow up, I am uncertain if we will be able to get monitoring completed but will try (Zio AT for 7 days).  She will discuss her questions regarding a.c post op with Dr. Caryl Comes.  Disposition: F/u as scheduled  Current medicines are reviewed at length with the patient today.  The patient did not have any concerns regarding medicines.  Venetia Night, PA-C 08/16/2020 10:44 AM     [address] [place] [address] [phone] (office)  [phone] (fax)

## 2020-08-16 NOTE — Telephone Encounter (Signed)
Enrolled patient for a 7 day Zio AT monitor to be mailed to patients home  

## 2020-08-20 ENCOUNTER — Encounter (INDEPENDENT_AMBULATORY_CARE_PROVIDER_SITE_OTHER): Payer: Medicare Other

## 2020-08-20 DIAGNOSIS — R42 Dizziness and giddiness: Secondary | ICD-10-CM

## 2020-08-22 ENCOUNTER — Encounter (HOSPITAL_COMMUNITY): Payer: Self-pay

## 2020-08-22 ENCOUNTER — Telehealth: Payer: Self-pay | Admitting: *Deleted

## 2020-08-22 ENCOUNTER — Ambulatory Visit (HOSPITAL_COMMUNITY): Payer: Medicare Other | Attending: Cardiology

## 2020-08-22 NOTE — Telephone Encounter (Signed)
They called to make [place] aware they had to send a replacement device. REF # [id number]

## 2020-08-22 NOTE — Telephone Encounter (Signed)
[employer] sent patient replacement 7 day ZIO AT long term monitor-Live Telemetry.

## 2020-08-22 NOTE — Progress Notes (Signed)
Verified appointment "no show" status with A. Carter at 07:28.  

## 2020-08-27 ENCOUNTER — Ambulatory Visit: Payer: Medicare Other | Admitting: Family Medicine

## 2020-08-30 ENCOUNTER — Other Ambulatory Visit: Payer: Self-pay

## 2020-08-30 ENCOUNTER — Encounter: Payer: Self-pay | Admitting: Internal Medicine

## 2020-08-30 ENCOUNTER — Ambulatory Visit: Payer: Medicare Other | Admitting: Internal Medicine

## 2020-08-30 VITALS — BP 122/82 | HR 83 | Ht 64.5 in | Wt 234.8 lb

## 2020-08-30 DIAGNOSIS — R002 Palpitations: Secondary | ICD-10-CM | POA: Diagnosis not present

## 2020-08-30 DIAGNOSIS — R42 Dizziness and giddiness: Secondary | ICD-10-CM

## 2020-08-30 NOTE — Progress Notes (Signed)
Patient Care Team: Fayrene Helper, MD as PCP - General Deboraha Sprang, MD as PCP - Cardiology (Cardiology) Selinda Eon (Inactive) as Referring Physician Ray, Debroah Loop, MD (Anesthesiology) Hennie Duos, MD as Consulting Physician (Rheumatology) Ray, Debroah Loop, MD as Referring Physician (Anesthesiology) Lovenia Shuck, MD as Referring Physician (Cardiothoracic Surgery)   HPI  [name] is a 59 y.o. female Not seen by me since 2016.  Remotely she has a history of arrhythmias for which she underwent ablation 96 and 97. Had a series of surgical procedures for breast cancer, this diagnosis of ovarian cancer which turned out to be an ovarian cyst with catastrophic complications involving her GI tract as well as breast implants including pulmonary embolism for which she received an IVC filter 2006 and sepsis.    Her IVC filter apparently eroded through vena cava and was beginning to ossify.  She was seen 11/19 for preoperative assessment for its removal.  This apparently has been associated with back pain.  Undergoing extraction next week in [place]  History of recurrent atypical chest pain.  Imaging as below.   DATE TEST EF   1/20 CTA Ca Score 0   1/20 Echo   60-65 %         Event recorder 1/20 palpitations personally reviewed.  Palpitations were associated with sinus rhythm and an occasional ectopic beat.  Now wearing a Zio patch for recurrent palpitations both flutters as well as tachypalpitations some associated with chest pain which radiates to her neck and right arm  Date Cr K Hgb  1/20 0.79 4.3 14.0(11/19)     Records and Results Reviewed   Past Medical History:  Diagnosis Date  . Abdominal hernia 08/14/2019  . Abdominal pain 06/06/2019  . Acute bronchitis 05/01/2013  . Acute cystitis 06/09/2010   Qualifier: Diagnosis of  By: Cori Razor LPN, Brandi    . Acute renal insufficiency 08/28/2011  . Anxiety   . Anxiety state 03/16/2008   Qualifier:  Diagnosis of  By: Dierdre Harness    . Back pain with radiation 07/17/2014  . Bilateral hand pain 07/27/2016  . Carpal tunnel syndrome   . Chronic constipation   . Chronic pain syndrome    followed by [facility]---  back  . Depression   . Dermatitis 12/15/2011  . Educated about COVID-19 virus infection 05/14/2019  . Fall at home 01/26/2016  . Family history of adverse reaction to anesthesia    MOTHER--- PONV  . Fatigue 09/17/2012  . Fibromyalgia   . FIBROMYALGIA 03/16/2008   Qualifier: Diagnosis of  By: Dierdre Harness    . GERD (gastroesophageal reflux disease)   . Headache disorder 01/16/2013  . Headache syndrome 01/26/2016  . Hip pain, right 07/08/2019  . History of MRSA infection    lip abscess  . History of ovarian cyst 06/2011   s/p  BSO  . History of pulmonary embolus (PE) 1997   post EP with ablation pulmonary veouns for SVT/ Atrial Fib.  . History of supraventricular tachycardia    s/p  ablation 1996  and 1997  by dr Caryl Comes  . History of TIA (transient ischemic attack) 1997   post op EP ablation PE  . Hyperlipidemia   . Hyperlipidemia LDL goal <100 03/16/2008   Qualifier: Diagnosis of  By: Dierdre Harness    . Hypothyroidism    followed by pcp  . Incisional hernia   . Insomnia 12/15/2011  . Intermittent palpitations 08/22/2017  .  Interstitial cystitis    09-13-2018   per pt last flare-up  May 2019 (followed by pcp)  . INTERSTITIAL CYSTITIS 02/17/2011   Qualifier: Diagnosis of  By: Moshe Cipro MD, Joycelyn Schmid    . Iron deficiency anemia    09-13-2018  PER PT STABLE  . Irregular heart rate 07/25/2013  . Lipoma of back    upper  . Low back pain radiating to right leg 07/04/2019  . Low ferritin level 06/14/2014  . MDD (major depressive disorder), single episode, in full remission ([place]) 10/27/2017  . Medically noncompliant 02/28/2015   Multiple missed appointments, both follow-up appointments and lab appointments.   . Metabolic syndrome X 3/79/0240   Qualifier: Diagnosis of  By:  Moshe Cipro MD, Margaret  hBA1c is 5.8 in 02/2013   . Migraines   . Morbid obesity ([place]) 03/27/2013  . Muscle spasm 01/03/2020  . Nausea alone 07/17/2014  . NECK PAIN, CHRONIC 03/16/2008   Qualifier: Diagnosis of  By: Dierdre Harness    . Normal coronary arteries    a. by CT 12/2018.  . Obesity 03/16/2008   Qualifier: Diagnosis of  By: Dierdre Harness    . Oral ulceration 08/23/2014   Presented at 06/14/2014 visit   . Other malaise and fatigue 03/16/2008   Centricity Description: FATIGUE, CHRONIC Qualifier: Diagnosis of  By: Dierdre Harness   Centricity Description: FATIGUE Qualifier: Diagnosis of  By: Christy Sartorius, [place]    . OVARIAN CYST 12/26/2009   Qualifier: Diagnosis of  By: Moshe Cipro MD, Joycelyn Schmid    . Paget disease of breast, left ([place])   . Paget's disease of breast, left ([place]) 03/16/2008   Qualifier: Diagnosis of  By: Dierdre Harness  Left diagnosed in 2006 F/h breast cancer x 15 family members  . PONV (postoperative nausea and vomiting)    SEVERE  . Postsurgical menopause 11/13/2011  . Presence of IVC filter 06/06/2019  . PSVT (paroxysmal supraventricular tachycardia) ([place])    [place]  . Recurrent oral herpes simplex infection 10/13/2017  . ROM (right otitis media) 01/23/2013  . S/P insertion of IVC (inferior vena caval) filter 05/08/2005   [facility] (non-retrievable)  /  dx 2019  a leg of filter is protruding thru the vena cava in to right L2 vertebral body (09-13-2018  per pt having surgery to remove filter in [place])  . S/P radiofrequency ablation operation for arrhythmia 1996   1996  and 1997,   SVT and Atrial Fib  . Sinusitis, chronic 01/26/2016  . SMALL BOWEL OBSTRUCTION, HX OF 08/07/2008   Annotation: obstruction w/ adhesions led to partial colectomy Qualifier: Diagnosis of  By: Craige Cotta    . Swelling of hand 09/17/2012  . Syncope   . Tachycardia 02/03/2010   Qualifier: Diagnosis of  By: Via LPN, Jeani Hawking    . Ulcer 12/15/2011  . Urinary frequency 07/18/2014  . Vaginitis  and vulvovaginitis 05/10/2013  . Vitamin D deficiency 05/05/2015  . Wears glasses     Past Surgical History:  Procedure Laterality Date  . ABDOMINAL HYSTERECTOMY  1987  . ANTERIOR CERVICAL DECOMP/DISCECTOMY FUSION  03-07-2002   dr elsner  @[facility]    C 4 -- 5  . APPENDECTOMY  1980  . AUGMENTATION MAMMAPLASTY Right 2006  . BILATERAL SALPINGOOPHORECTOMY  07/24/2011   via Explor. Lap. w/ intraoperative perf. bowel repair  . BREAST BIOPSY Right 2019   benign  . BREAST ENHANCEMENT SURGERY Bilateral 1993  . BREAST IMPLANT REMOVAL Bilateral   . CARDIAC CATHETERIZATION  07-06-2003  dr Darnell Level brodie   normal coronaries and LVF  . CARDIAC ELECTROPHYSIOLOGY STUDY AND ABLATION  1996  and 1997  . CARDIOVASCULAR STRESS TEST  11-18-2015   dr Caryl Comes   normal nuclear study w/ no ischemia/  normal LV function and wall motion , ef 84%  . CARPAL TUNNEL RELEASE Right ?  . [facility]  . COLON SURGERY    . CYSTO/  HYDRODISTENTION/  INSTILATION THERAPY  MULTIPLE  . ENTEROCUTANEOUS FISTULA CLOSURE  multiple   last one 2015 with small bowel resection  . EXPLORATORY LAPAROTOMY INCISIONAL VENTRAL HERNIA REPAIR / RESECTION SMALL BOWEL  11-09-2014   @[facility]   . LIPOMA EXCISION Right 09/16/2018   Procedure: EXCISION LIPOMA UPPER BACK;  Surgeon: Clovis Riley, MD;  Location: [facility];  Service: General;  Laterality: Right;  . MASTECTOMY Left 2006   w/ reconstruction on left (paget's disease)  and right breast augmentation  . TOTAL COLECTOMY  08-04-2002    @[facility]    AND CHOLECYSTECTOMY  (colonic inertia)  . TRANSTHORACIC ECHOCARDIOGRAM  02/04/2016   ef 60-65%,  grade 2 diastolic dysfunction/  mild MR  . VENA CAVA FILTER PLACEMENT  05/08/2005   @[facility]    [facility] (non-retrievable)  . WIDE EXCISION PERIRECTAL ABSCESSES  09-22-2005   @ [facility]    Current Meds  Medication Sig  . ALPRAZolam (XANAX) 1 MG tablet TAKE 1 TABLET(1 MG) BY MOUTH TWICE DAILY AS NEEDED FOR ANXIETY  . aspirin 81 MG  chewable tablet Chew 81 mg by mouth daily.  . butalbital-acetaminophen-caffeine (FIORICET) 50-325-40 MG tablet Take 1 tablet by mouth as directed.  . conjugated estrogens (PREMARIN) vaginal cream Place fingertip amount intravaginally 2 to 3 times per week  . cyanocobalamin (,VITAMIN B-12,) 1000 MCG/ML injection INJECT 1 ML INTO THE MUSCLE EVERY 30 DAYS.  Marland Kitchen cyclobenzaprine (FLEXERIL) 10 MG tablet TAKE 1 TABLET BY MOUTH TWICE DAILY AS NEEDED FOR MUSCLE SPASMS  . cycloSPORINE (RESTASIS) 0.05 % ophthalmic emulsion Place 1 drop into both eyes 2 (two) times daily.  Marland Kitchen estradiol (ESTRACE VAGINAL) 0.1 MG/GM vaginal cream Place 1 Applicatorful vaginally at bedtime.  Marland Kitchen ezetimibe (ZETIA) 10 MG tablet Take 1 tablet (10 mg total) by mouth daily.  . fluticasone (FLONASE) 50 MCG/ACT nasal spray Place 2 sprays into both nostrils as needed for allergies or rhinitis.  . Glycerin, Laxative, 80.7 % SUPP Place 1 suppository rectally at bedtime as needed (for constipation).   Marland Kitchen HYDROmorphone (DILAUDID) 4 MG tablet One tablet twice daily  . levothyroxine (SYNTHROID) 125 MCG tablet TAKE 1 TABLET(125 MCG) BY MOUTH DAILY BEFORE BREAKFAST  . MAGNESIUM-OXIDE 400 (241.3 Mg) MG tablet TAKE 1 TABLET(400 MG) BY MOUTH TWICE DAILY  . meclizine (ANTIVERT) 25 MG tablet Take 1 tablet (25 mg total) by mouth 3 (three) times daily as needed for dizziness.  . metoprolol tartrate (LOPRESSOR) 25 MG tablet Take 1 tablet (25 mg total) by mouth 2 (two) times daily as needed (palpitations). Please make overdue appt with Dr. Caryl Comes before anymore refills. 1st attempt  . Multiple Vitamins-Minerals (ICAPS AREDS 2) CAPS Take by mouth at bedtime.  Marland Kitchen omeprazole (PRILOSEC) 40 MG capsule Take 1 capsule (40 mg total) by mouth daily.  . pravastatin (PRAVACHOL) 40 MG tablet TAKE 1 TABLET(40 MG) BY MOUTH DAILY  . promethazine (PHENERGAN) 25 MG tablet Take 1 tablet by mouth as needed.  . senna-docusate (SENOKOT-S) 8.6-50 MG per tablet Take 2 tablets by mouth  3 (three) times daily. constipation   . Vitamin D,  Ergocalciferol, (DRISDOL) 50000 units CAPS capsule Take 50,000 Units by mouth every 7 (seven) days.     Allergies  Allergen Reactions  . Nitrofurantoin Hives  . Crestor [Rosuvastatin Calcium] Other (See Comments)    Generalized cramps  . Bisacodyl Other (See Comments)    Makes patient feel like she is having cramps Makes patient feel like she is having cramps  . Clarithromycin Hives and Other (See Comments)    Other reaction(s): Other (See Comments) Cluster migraines  Cluster migraines  . Clarithromycin Other (See Comments)    Cluster migraines   . Clindamycin Hcl Hives  . Codeine Itching  . Monosodium Glutamate Other (See Comments)    Cluster migraines  Cluster migraines  . Scopolamine Hbr Other (See Comments)    Cluster migraines, impaired vision      Review of Systems negative except from HPI and PMH  Physical Exam BP 122/82   Pulse 83   Ht 5' 4.5" (1.638 m)   Wt 234 lb 12.8 oz (106.5 kg)   SpO2 94%   BMI 39.68 kg/m  Well developed and well nourished in no acute distress HENT normal E scleral and icterus clear Neck Supple JVP flat; carotids brisk and full Clear to ausculation  *Regular rate and rhythm, no murmurs gallops or rub Soft with active bowel sounds No clubbing cyanosis  Edema Alert and oriented, grossly normal motor and sensory function Skin Warm and Dry  ECG sinus @ 78 Intervals 13/08/40 Otherwise normal  CrCl cannot be calculated (Patient's most recent lab result is older than the maximum 21 days allowed.).   Assessment and  Plan  Palpitations  Chest pain-noncardiac  IVC filter with erosion to undergo extraction  Obesity  Patient has palpitations recurrently.  She is now on a monitor again.  We will look for significant arrhythmia.  History of remote ablation.  No interval he detected significant arrhythmia.  Chest pain-atypical.  Calcium score was 0   1 year ago.  No further  cardiac work-up.  We will review monitor Current medicines are reviewed at length with the patient today .  The patient does not  have concerns regarding medicines.

## 2020-08-30 NOTE — Patient Instructions (Signed)
Medication Instructions:  Your physician recommends that you continue on your current medications as directed. Please refer to the Current Medication list given to you today.  *If you need a refill on your cardiac medications before your next appointment, please call your pharmacy*   Lab Work: none If you have labs (blood work) drawn today and your tests are completely normal, you will receive your results only by: Marland Kitchen MyChart Message (if you have MyChart) OR . A paper copy in the mail If you have any lab test that is abnormal or we need to change your treatment, we will call you to review the results.   Testing/Procedures: none   Follow-Up: At [facility], you and your health needs are our priority.  As part of our continuing mission to provide you with exceptional heart care, we have created designated Provider Care Teams.  These Care Teams include your primary Cardiologist (physician) and Advanced Practice Providers (APPs -  Physician Assistants and Nurse Practitioners) who all work together to provide you with the care you need, when you need it.  We recommend signing up for the patient portal called "MyChart".  Sign up information is provided on this After Visit Summary.  MyChart is used to connect with patients for Virtual Visits (Telemedicine).  Patients are able to view lab/test results, encounter notes, upcoming appointments, etc.  Non-urgent messages can be sent to your provider as well.   To learn more about what you can do with MyChart, go to [email or web address].    Your next appointment:   1 year(s)  The format for your next appointment:   In Person  Provider:   Virl Axe, MD   Other Instructions

## 2020-09-02 ENCOUNTER — Other Ambulatory Visit: Payer: Self-pay | Admitting: Internal Medicine

## 2020-09-04 ENCOUNTER — Telehealth: Payer: Self-pay | Admitting: *Deleted

## 2020-09-04 NOTE — Telephone Encounter (Signed)
[name] from [facility] calling to inform the patient's monitor was activated and worn for only 4 days, not the full 7. He states they were not able to get in contact with the patient. He states if there are any questions to call [phone] ref [id number]

## 2020-09-09 ENCOUNTER — Telehealth: Payer: Self-pay

## 2020-09-09 NOTE — Telephone Encounter (Signed)
Pt requesting a call back from the nurse. Call back @ [phone].

## 2020-09-10 ENCOUNTER — Telehealth: Payer: Self-pay

## 2020-09-10 NOTE — Telephone Encounter (Signed)
See previous message. I spoke with patient

## 2020-09-10 NOTE — Telephone Encounter (Signed)
Tripped on toy and fell and has a big knot on her left thigh and topof left foot hurts but [employer] is sending someone out to do imaging to keep her from having to go to ER. She will call with results or if she needs anything

## 2020-09-17 ENCOUNTER — Other Ambulatory Visit: Payer: Self-pay

## 2020-09-17 ENCOUNTER — Ambulatory Visit (HOSPITAL_COMMUNITY): Payer: Medicare Other | Attending: Cardiovascular Disease

## 2020-09-17 DIAGNOSIS — R002 Palpitations: Secondary | ICD-10-CM

## 2020-09-17 DIAGNOSIS — R079 Chest pain, unspecified: Secondary | ICD-10-CM | POA: Insufficient documentation

## 2020-09-17 LAB — ECHOCARDIOGRAM COMPLETE
Area-P 1/2: 3.99 cm2
S' Lateral: 3.5 cm

## 2020-09-17 MED ORDER — PERFLUTREN LIPID MICROSPHERE
1.0000 mL | INTRAVENOUS | Status: AC | PRN
  Administered 2020-09-17: 1 mL via INTRAVENOUS

## 2020-09-17 NOTE — Progress Notes (Signed)
[name] presented for echo today. She said on 09/05/20, on a road trip out of town, she experienced an excruciating chest pain. Her husband had to stop a service station to call emergency services but that was not realized due to the pain subsided a couple of minutes after stopping. She has had 4 separate instances of chest pain since that incident.   I relayed her experience with Dr. Olin Pia nurse, [place]. She is aware and will call the patient for more information.

## 2020-09-18 ENCOUNTER — Telehealth: Payer: Self-pay

## 2020-09-18 NOTE — Telephone Encounter (Signed)
Attempted phone call to pt.  Left voicemail message to contact RN at [phone]. 

## 2020-09-19 ENCOUNTER — Telehealth: Payer: Self-pay | Admitting: *Deleted

## 2020-09-19 NOTE — Telephone Encounter (Signed)
Spoke with pt who complains of episodic chest pain that lasts for a few seconds to a minute.  Pt reports she had an episode on a trip to [place], [place] 08/20/2020 that lasted about 5 minutes.  She reports the pain was incredibly intense in the center of her chest with radiation to her jaw and left arm.  She did also have some diaphoresis.  The pain eventually did resolve on it's own.  She has since had 4 shorter episodes  Pt has discussed with her PCP who refers her back to cardiology.  Dr Caryl Comes saw pt in the office 08/30/2020.   Pt plans to have surgery in [place] on October 07, 2020 to remove filter.  Will forward information to Dr Caryl Comes, Melina Copa, PA-C and Tommye Standard, PA-C as they have all seen pt before.  Reviewed ED precautions.  Pt verbalizes understanding and agrees with current plan.

## 2020-09-19 NOTE — Telephone Encounter (Signed)
Spoke with patient about monitor results and verbalized understanding. Patient brung up episode of chest pain that was being discussed already with Dr. Caryl Comes and Nurse Rosann Auerbach in Patient ,Message Mychart encounter.09-10-20   Patient was asked if she would be by her home phone number [phone] to be able to contact back and stated yes.Patient was told Nurse Rosann Auerbach will be contacted.

## 2020-09-19 NOTE — Telephone Encounter (Signed)
I have not seen patient since 2019 so will defer to the team that has seen her most recently for input. Thx.

## 2020-09-23 NOTE — Telephone Encounter (Signed)
Agree as above

## 2020-09-23 NOTE — Telephone Encounter (Signed)
Attempted phone call to pt to discuss recommendations by Dr Caryl Comes and Tommye Standard, PA-C.  Left voicemail message to contact RN at [phone].  Per Tommye Standard PA-C, In review, her CP has been felt to be non-cardiac by coronary AT with NO CAD, and in review of Dr. Olin Pia note only a couple weeks ago.  I would recommend evaluation into non-cardiac etiologies via her PMD for her symptom.

## 2020-09-27 ENCOUNTER — Other Ambulatory Visit: Payer: Self-pay | Admitting: Family Medicine

## 2020-09-27 ENCOUNTER — Telehealth: Payer: Self-pay

## 2020-09-27 MED ORDER — ALPRAZOLAM 1 MG PO TABS: 1.0000 mg | ORAL_TABLET | Freq: Two times a day (BID) | ORAL | 2 refills | Status: DC

## 2020-09-27 NOTE — Telephone Encounter (Signed)
Spoke with pt and advised per Dr Caryl Comes and Tommye Standard, PA-C both feel CP is not cardiac in nature and pt should contact PCP for further follow up.  Pt verbalizes understanding and agrees with current plan.

## 2020-09-27 NOTE — Telephone Encounter (Signed)
Xanax is refilled. She needs her appt with me rescheduled to December since she has October surgery , pls arrange and let her know

## 2020-09-27 NOTE — Telephone Encounter (Signed)
Pt needs a prescription on the Xanax.  And premerian Cream    Surgery is scheduled at [facility] Oct 11th  At 12:30 To remove the IVC Filters.   Pt is aware that this will not be answered until Monday

## 2020-09-27 NOTE — Telephone Encounter (Signed)
Would you like to order refill on xanax?

## 2020-09-30 ENCOUNTER — Encounter (INDEPENDENT_AMBULATORY_CARE_PROVIDER_SITE_OTHER): Payer: Self-pay

## 2020-09-30 ENCOUNTER — Telehealth: Payer: Self-pay | Admitting: Family Medicine

## 2020-09-30 ENCOUNTER — Other Ambulatory Visit: Payer: Self-pay | Admitting: Family Medicine

## 2020-09-30 DIAGNOSIS — K224 Dyskinesia of esophagus: Secondary | ICD-10-CM

## 2020-09-30 NOTE — Progress Notes (Signed)
[facility]  

## 2020-09-30 NOTE — Telephone Encounter (Signed)
LVM for pt to call the office regarding appt change

## 2020-09-30 NOTE — Telephone Encounter (Signed)
Pls speak with pt, pap overdue, needs to keep appt for that, remind her that her visit in the office with me is oVERDUE I will enter referral for dr Ples Specter will see her as able,h does now have a partner in the office flu vaccine due and if she has had this and covid vaccines, get dates from her and enter please. If needs flu she can get it here

## 2020-09-30 NOTE — Telephone Encounter (Signed)
Patient states she went to her heart doctor for issues and was told its her esophagus having spasms she is wanting a urgent referral to [place] [facility] p# [phone]

## 2020-10-01 ENCOUNTER — Encounter: Payer: Self-pay | Admitting: Family Medicine

## 2020-10-01 NOTE — Telephone Encounter (Signed)
Patient is scheduled for surgery in [place] on 10/11. She has an appt scheduled with you in Nov. Covid vaccines are documented. She will get her Flu vaccine at [facility].

## 2020-10-10 ENCOUNTER — Encounter: Payer: Medicare Other | Admitting: Family Medicine

## 2020-10-21 ENCOUNTER — Telehealth: Payer: Self-pay

## 2020-10-21 NOTE — Telephone Encounter (Signed)
Pls request that she scan over d/c instructions  with med list and see if you v an import her d/c summary

## 2020-10-21 NOTE — Telephone Encounter (Signed)
Patient called stating she had just gotten back from [place] from having surgery. They started her on Xarelto 20mg . Is wanting you to prescribe this for her. She needs a refill. Please send to [facility] in [place] please.   Also states she is having vertigo and thinks she has an ear infection. Will schedule her an appt for this.

## 2020-10-22 ENCOUNTER — Other Ambulatory Visit: Payer: Self-pay

## 2020-10-22 ENCOUNTER — Ambulatory Visit: Payer: Medicare Other | Admitting: Internal Medicine

## 2020-10-22 ENCOUNTER — Encounter: Payer: Self-pay | Admitting: Internal Medicine

## 2020-10-22 VITALS — BP 126/84 | HR 93 | Temp 97.5°F | Resp 16 | Ht 64.5 in | Wt 231.0 lb

## 2020-10-22 DIAGNOSIS — Z79891 Long term (current) use of opiate analgesic: Secondary | ICD-10-CM | POA: Insufficient documentation

## 2020-10-22 DIAGNOSIS — Z86711 Personal history of pulmonary embolism: Secondary | ICD-10-CM

## 2020-10-22 DIAGNOSIS — N898 Other specified noninflammatory disorders of vagina: Secondary | ICD-10-CM

## 2020-10-22 DIAGNOSIS — H66006 Acute suppurative otitis media without spontaneous rupture of ear drum, recurrent, bilateral: Secondary | ICD-10-CM | POA: Diagnosis not present

## 2020-10-22 DIAGNOSIS — G43909 Migraine, unspecified, not intractable, without status migrainosus: Secondary | ICD-10-CM | POA: Insufficient documentation

## 2020-10-22 MED ORDER — AMOXICILLIN-POT CLAVULANATE 875-125 MG PO TABS: 1.0000 | ORAL_TABLET | Freq: Two times a day (BID) | ORAL | 0 refills | Status: DC

## 2020-10-22 MED ORDER — XARELTO 20 MG PO TABS: 20.0000 mg | ORAL_TABLET | Freq: Every day | ORAL | 5 refills | Status: DC

## 2020-10-22 NOTE — Progress Notes (Signed)
Acute Office Visit  Subjective:    Patient ID: [name], female    DOB: 17-May-1961, 59 y.o.   MRN: [id number]  Chief Complaint  Patient presents with  . Dizziness and ear pain   HPI [name] is a 59 y.o. female with PMH of HTN, hypothyroidism, PE, HLD, GERD and obesity who presented with a complaint of dizziness and bilateral ear pain.  She states that she has been having recurrent ear infections for the last few months.  Patient has been having bilateral ear pain associated with nonbloody discharge.  Patient also complains of tingling at times and dizziness, which she describes as room spinning sensation.  Patient has tried taking meclizine, which has provided minimal relief with the dizziness.  Patient denies any fever, chills, pain behind the ear, nasal congestion, runny nose, sore throat, dysphagia, odynophagia.  Patient denies any hearing difficulty.  She denies any recent injury around the ears. She likes to be referred to ENT specialist for evaluation of recurrent ear infections.  Patient requests a refill of Xarelto.  She takes it for the history of PE.  Patient had her IVC removed recently as it was fractured.  Patient also has been having dryness in the vagina, but denies any vaginal discharge or bleeding.  She prefers to be referred to Ob./Gyn.    Past Medical History:  Diagnosis Date  . Abdominal hernia 08/14/2019  . Abdominal pain 06/06/2019  . Acute bronchitis 05/01/2013  . Acute cystitis 06/09/2010   Qualifier: Diagnosis of  By: Cori Razor LPN, Brandi    . Acute renal insufficiency 08/28/2011  . Anxiety   . Anxiety state 03/16/2008   Qualifier: Diagnosis of  By: Dierdre Harness    . Back pain with radiation 07/17/2014  . Bilateral hand pain 07/27/2016  . Carpal tunnel syndrome   . Chronic constipation   . Chronic pain syndrome    followed by [facility]---  back  . Depression   . Dermatitis 12/15/2011  . Educated about COVID-19 virus infection  05/14/2019  . Fall at home 01/26/2016  . Family history of adverse reaction to anesthesia    MOTHER--- PONV  . Fatigue 09/17/2012  . Fibromyalgia   . FIBROMYALGIA 03/16/2008   Qualifier: Diagnosis of  By: Dierdre Harness    . GERD (gastroesophageal reflux disease)   . Headache disorder 01/16/2013  . Headache syndrome 01/26/2016  . Hip pain, right 07/08/2019  . History of MRSA infection    lip abscess  . History of ovarian cyst 06/2011   s/p  BSO  . History of pulmonary embolus (PE) 1997   post EP with ablation pulmonary veouns for SVT/ Atrial Fib.  . History of supraventricular tachycardia    s/p  ablation 1996  and 1997  by dr Caryl Comes  . History of TIA (transient ischemic attack) 1997   post op EP ablation PE  . Hyperlipidemia   . Hyperlipidemia LDL goal <100 03/16/2008   Qualifier: Diagnosis of  By: Dierdre Harness    . Hypothyroidism    followed by pcp  . Incisional hernia   . Insomnia 12/15/2011  . Intermittent palpitations 08/22/2017  . Interstitial cystitis    09-13-2018   per pt last flare-up  May 2019 (followed by pcp)  . INTERSTITIAL CYSTITIS 02/17/2011   Qualifier: Diagnosis of  By: Moshe Cipro MD, Joycelyn Schmid    . Iron deficiency anemia    09-13-2018  PER PT STABLE  . Irregular heart rate 07/25/2013  .  Lipoma of back    upper  . Low back pain radiating to right leg 07/04/2019  . Low ferritin level 06/14/2014  . MDD (major depressive disorder), single episode, in full remission ([place]) 10/27/2017  . Medically noncompliant 02/28/2015   Multiple missed appointments, both follow-up appointments and lab appointments.   . Metabolic syndrome X 0/97/3532   Qualifier: Diagnosis of  By: Moshe Cipro MD, Margaret  hBA1c is 5.8 in 02/2013   . Migraines   . Morbid obesity ([place]) 03/27/2013  . Muscle spasm 01/03/2020  . Nausea alone 07/17/2014  . NECK PAIN, CHRONIC 03/16/2008   Qualifier: Diagnosis of  By: Dierdre Harness    . Normal coronary arteries    a. by CT 12/2018.  . Obesity 03/16/2008   Qualifier:  Diagnosis of  By: Dierdre Harness    . Oral ulceration 08/23/2014   Presented at 06/14/2014 visit   . Other malaise and fatigue 03/16/2008   Centricity Description: FATIGUE, CHRONIC Qualifier: Diagnosis of  By: Dierdre Harness   Centricity Description: FATIGUE Qualifier: Diagnosis of  By: Christy Sartorius, [place]    . OVARIAN CYST 12/26/2009   Qualifier: Diagnosis of  By: Moshe Cipro MD, Joycelyn Schmid    . Paget disease of breast, left ([place])   . Paget's disease of breast, left ([place]) 03/16/2008   Qualifier: Diagnosis of  By: Dierdre Harness  Left diagnosed in 2006 F/h breast cancer x 15 family members  . PONV (postoperative nausea and vomiting)    SEVERE  . Postsurgical menopause 11/13/2011  . Presence of IVC filter 06/06/2019  . PSVT (paroxysmal supraventricular tachycardia) ([place])    [place]  . Recurrent oral herpes simplex infection 10/13/2017  . ROM (right otitis media) 01/23/2013  . S/P insertion of IVC (inferior vena caval) filter 05/08/2005   [place] (non-retrievable)  /  dx 2019  a leg of filter is protruding thru the vena cava in to right L2 vertebral body (09-13-2018  per pt having surgery to remove filter in [place])  . S/P radiofrequency ablation operation for arrhythmia 1996   1996  and 1997,   SVT and Atrial Fib  . Sinusitis, chronic 01/26/2016  . SMALL BOWEL OBSTRUCTION, HX OF 08/07/2008   Annotation: obstruction w/ adhesions led to partial colectomy Qualifier: Diagnosis of  By: Craige Cotta    . Swelling of hand 09/17/2012  . Syncope   . Tachycardia 02/03/2010   Qualifier: Diagnosis of  By: Via LPN, Jeani Hawking    . Ulcer 12/15/2011  . Urinary frequency 07/18/2014  . Vaginitis and vulvovaginitis 05/10/2013  . Vitamin D deficiency 05/05/2015  . Wears glasses     Past Surgical History:  Procedure Laterality Date  . ABDOMINAL HYSTERECTOMY  1987  . ANTERIOR CERVICAL DECOMP/DISCECTOMY FUSION  03-07-2002   dr elsner  @[facility]    C 4 -- 5  . APPENDECTOMY  1980  . AUGMENTATION MAMMAPLASTY  Right 2006  . BILATERAL SALPINGOOPHORECTOMY  07/24/2011   via Explor. Lap. w/ intraoperative perf. bowel repair  . BREAST BIOPSY Right 2019   benign  . BREAST ENHANCEMENT SURGERY Bilateral 1993  . BREAST IMPLANT REMOVAL Bilateral   . CARDIAC CATHETERIZATION  07-06-2003   dr Darnell Level brodie   normal coronaries and LVF  . CARDIAC ELECTROPHYSIOLOGY STUDY AND ABLATION  1996  and 1997  . CARDIOVASCULAR STRESS TEST  11-18-2015   dr Caryl Comes   normal nuclear study w/ no ischemia/  normal LV function and wall motion , ef 84%  . CARPAL TUNNEL RELEASE  Right ?  . [place]  . COLON SURGERY    . CYSTO/  HYDRODISTENTION/  INSTILATION THERAPY  MULTIPLE  . ENTEROCUTANEOUS FISTULA CLOSURE  multiple   last one 2015 with small bowel resection  . EXPLORATORY LAPAROTOMY INCISIONAL VENTRAL HERNIA REPAIR / RESECTION SMALL BOWEL  11-09-2014   @[facility]   . LIPOMA EXCISION Right 09/16/2018   Procedure: EXCISION LIPOMA UPPER BACK;  Surgeon: Clovis Riley, MD;  Location: [facility];  Service: General;  Laterality: Right;  . MASTECTOMY Left 2006   w/ reconstruction on left (paget's disease)  and right breast augmentation  . TOTAL COLECTOMY  08-04-2002    @[facility]    AND CHOLECYSTECTOMY  (colonic inertia)  . TRANSTHORACIC ECHOCARDIOGRAM  02/04/2016   ef 60-65%,  grade 2 diastolic dysfunction/  mild MR  . VENA CAVA FILTER PLACEMENT  05/08/2005   @[facility]    [facility] (non-retrievable)  . WIDE EXCISION PERIRECTAL ABSCESSES  09-22-2005   @ [facility]    Family History  Problem Relation Age of Onset  . Diabetes Mother   . Heart disease Mother   . Hypertension Mother   . Heart disease Father   . Hyperlipidemia Father   . Hypertension Father   . Alcohol abuse Father   . Colon cancer Maternal Aunt   . Breast cancer Maternal Aunt   . Cancer Maternal Uncle        mets  . Bone cancer Maternal Grandfather        mets  . Ovarian cancer Cousin 1  . Breast cancer Cousin   . Prostate cancer Maternal  Uncle     Social History   Socioeconomic History  . Marital status: Married    Spouse name: Not on file  . Number of children: Not on file  . Years of education: Not on file  . Highest education level: Not on file  Occupational History  . Not on file  Tobacco Use  . Smoking status: Never Smoker  . Smokeless tobacco: Never Used  Vaping Use  . Vaping Use: Never used  Substance and Sexual Activity  . Alcohol use: No  . Drug use: No  . Sexual activity: Yes    Birth control/protection: Surgical  Other Topics Concern  . Not on file  Social History Narrative  . Not on file   Social Determinants of Health   Financial Resource Strain:   . Difficulty of Paying Living Expenses: Not on file  Food Insecurity:   . Worried About Charity fundraiser in the Last Year: Not on file  . Ran Out of Food in the Last Year: Not on file  Transportation Needs:   . Lack of Transportation (Medical): Not on file  . Lack of Transportation (Non-Medical): Not on file  Physical Activity:   . Days of Exercise per Week: Not on file  . Minutes of Exercise per Session: Not on file  Stress:   . Feeling of Stress : Not on file  Social Connections:   . Frequency of Communication with Friends and Family: Not on file  . Frequency of Social Gatherings with Friends and Family: Not on file  . Attends Religious Services: Not on file  . Active Member of Clubs or Organizations: Not on file  . Attends Archivist Meetings: Not on file  . Marital Status: Not on file  Intimate Partner Violence:   . Fear of Current or Ex-Partner: Not on file  . Emotionally Abused: Not on file  .  Physically Abused: Not on file  . Sexually Abused: Not on file    Outpatient Medications Prior to Visit  Medication Sig Dispense Refill  . ALPRAZolam (XANAX) 1 MG tablet Take 1 tablet (1 mg total) by mouth 2 (two) times daily. 60 tablet 2  . aspirin 81 MG chewable tablet Chew 81 mg by mouth daily.    .  butalbital-acetaminophen-caffeine (FIORICET) 50-325-40 MG tablet Take 1 tablet by mouth as directed.    . conjugated estrogens (PREMARIN) vaginal cream Place fingertip amount intravaginally 2 to 3 times per week 42.5 g 1  . cyanocobalamin (,VITAMIN B-12,) 1000 MCG/ML injection INJECT 1 ML INTO THE MUSCLE EVERY 30 DAYS.  0  . cyclobenzaprine (FLEXERIL) 10 MG tablet TAKE 1 TABLET BY MOUTH TWICE DAILY AS NEEDED FOR MUSCLE SPASMS 180 tablet 0  . cycloSPORINE (RESTASIS) 0.05 % ophthalmic emulsion Place 1 drop into both eyes 2 (two) times daily.    Marland Kitchen estradiol (ESTRACE VAGINAL) 0.1 MG/GM vaginal cream Place 1 Applicatorful vaginally at bedtime. 42.5 g 12  . ezetimibe (ZETIA) 10 MG tablet Take 1 tablet (10 mg total) by mouth daily. 90 tablet 3  . fluticasone (FLONASE) 50 MCG/ACT nasal spray Place 2 sprays into both nostrils as needed for allergies or rhinitis.    . Glycerin, Laxative, 80.7 % SUPP Place 1 suppository rectally at bedtime as needed (for constipation).     Marland Kitchen HYDROmorphone (DILAUDID) 4 MG tablet One tablet twice daily 20 tablet 0  . levothyroxine (SYNTHROID) 125 MCG tablet TAKE 1 TABLET(125 MCG) BY MOUTH DAILY BEFORE BREAKFAST 90 tablet 1  . MAGNESIUM-OXIDE 400 (241.3 Mg) MG tablet TAKE 1 TABLET(400 MG) BY MOUTH TWICE DAILY 180 tablet 0  . meclizine (ANTIVERT) 25 MG tablet Take 1 tablet (25 mg total) by mouth 3 (three) times daily as needed for dizziness. 30 tablet 0  . metoprolol tartrate (LOPRESSOR) 25 MG tablet Take 1 tablet (25 mg total) by mouth 2 (two) times daily. 180 tablet 3  . Multiple Vitamins-Minerals (ICAPS AREDS 2) CAPS Take by mouth at bedtime.    Marland Kitchen omeprazole (PRILOSEC) 40 MG capsule Take 1 capsule (40 mg total) by mouth daily. 180 capsule 3  . pravastatin (PRAVACHOL) 40 MG tablet TAKE 1 TABLET(40 MG) BY MOUTH DAILY 90 tablet 3  . promethazine (PHENERGAN) 25 MG tablet Take 1 tablet by mouth as needed.    . senna-docusate (SENOKOT-S) 8.6-50 MG per tablet Take 2 tablets by mouth  3 (three) times daily. constipation     . Vitamin D, Ergocalciferol, (DRISDOL) 50000 units CAPS capsule Take 50,000 Units by mouth every 7 (seven) days.     Alveda Reasons 20 MG TABS tablet Take 20 mg by mouth daily.    . DULoxetine (CYMBALTA) 30 MG capsule Take 1 capsule (30 mg total) by mouth 2 (two) times daily. 180 capsule 1   No facility-administered medications prior to visit.    Allergies  Allergen Reactions  . Nitrofurantoin Hives  . Crestor [Rosuvastatin Calcium] Other (See Comments)    Generalized cramps  . Bisacodyl Other (See Comments)    Makes patient feel like she is having cramps Makes patient feel like she is having cramps  . Clarithromycin Hives and Other (See Comments)    Other reaction(s): Other (See Comments) Cluster migraines  Cluster migraines  . Clarithromycin Other (See Comments)    Cluster migraines   . Clindamycin Hcl Hives  . Codeine Itching  . Monosodium Glutamate Other (See Comments)    Cluster migraines  Cluster migraines  . Scopolamine Hbr Other (See Comments)    Cluster migraines, impaired vision    Review of Systems  Constitutional: Negative for chills and fever.  HENT: Positive for ear discharge and ear pain. Negative for congestion, facial swelling, sinus pressure, sinus pain and sore throat.   Eyes: Negative for pain and discharge.  Respiratory: Negative for cough and shortness of breath.   Cardiovascular: Negative for chest pain and palpitations.  Gastrointestinal: Negative for abdominal pain, constipation, diarrhea, nausea and vomiting.  Endocrine: Negative for polydipsia and polyuria.  Genitourinary: Negative for dysuria and hematuria.  Musculoskeletal: Negative for neck pain and neck stiffness.  Skin: Negative for rash.  Neurological: Negative for dizziness and weakness.  Psychiatric/Behavioral: Negative for agitation and behavioral problems.       Objective:    Physical Exam Vitals reviewed.  Constitutional:      General: She  is not in acute distress.    Appearance: She is obese. She is not diaphoretic.  HENT:     Head: Normocephalic and atraumatic.     Right Ear: Hearing and external ear normal. A middle ear effusion is present. No foreign body.     Left Ear: Hearing and external ear normal. A middle ear effusion is present. No foreign body.     Nose: Nose normal.     Mouth/Throat:     Mouth: Mucous membranes are moist.  Eyes:     General: No scleral icterus.    Extraocular Movements: Extraocular movements intact.     Pupils: Pupils are equal, round, and reactive to light.  Cardiovascular:     Rate and Rhythm: Normal rate and regular rhythm.     Pulses: Normal pulses.     Heart sounds: Normal heart sounds. No murmur heard.  No friction rub. No gallop.   Pulmonary:     Breath sounds: Normal breath sounds. No wheezing or rales.  Abdominal:     Palpations: Abdomen is soft.     Tenderness: There is no abdominal tenderness.  Musculoskeletal:     Cervical back: Neck supple. No tenderness.     Right lower leg: No edema.     Left lower leg: No edema.  Skin:    General: Skin is warm.     Findings: No rash.  Neurological:     General: No focal deficit present.     Mental Status: She is alert and oriented to person, place, and time.     Sensory: No sensory deficit.     Motor: No weakness.  Psychiatric:        Mood and Affect: Mood normal.        Behavior: Behavior normal.     BP 126/84 (BP Location: Right Arm, Patient Position: Sitting, Cuff Size: Normal)   Pulse 93   Temp (!) 97.5 F (36.4 C) (Temporal)   Resp 16   Ht 5' 4.5" (1.638 m)   Wt 231 lb (104.8 kg)   SpO2 94%   BMI 39.04 kg/m  Wt Readings from Last 3 Encounters:  10/22/20 231 lb (104.8 kg)  08/30/20 234 lb 12.8 oz (106.5 kg)  08/16/20 223 lb (101.2 kg)    Health Maintenance Due  Topic Date Due  . COLONOSCOPY  11/04/2011  . PAP SMEAR-Modifier  12/17/2019    There are no preventive care reminders to display for this  patient.   Lab Results  Component Value Date   TSH 4.440 11/18/2018   Lab Results  Component Value Date  WBC 6.3 11/18/2018   HGB 14.0 11/18/2018   HCT 40.6 11/18/2018   MCV 88 11/18/2018   PLT 177 11/18/2018   Lab Results  Component Value Date   NA 138 01/19/2019   K 4.3 01/19/2019   CO2 22 01/19/2019   GLUCOSE 90 01/19/2019   BUN 16 01/19/2019   CREATININE 0.79 01/19/2019   BILITOT 0.4 04/19/2018   ALKPHOS 68 04/19/2018   AST 18 04/19/2018   ALT 16 04/19/2018   PROT 6.7 04/19/2018   ALBUMIN 4.4 04/19/2018   CALCIUM 9.6 01/19/2019   ANIONGAP 10 10/28/2017   Lab Results  Component Value Date   CHOL 214 (H) 07/04/2018   Lab Results  Component Value Date   HDL 48 07/04/2018   Lab Results  Component Value Date   LDLCALC 123 (H) 07/04/2018   Lab Results  Component Value Date   TRIG 214 (H) 07/04/2018   Lab Results  Component Value Date   CHOLHDL 4.5 (H) 07/04/2018   Lab Results  Component Value Date   HGBA1C 5.3 07/04/2018       Assessment & Plan:   Problem List Items Addressed This Visit      Recurrent acute suppurative otitis media without spontaneous rupture of tympanic membrane of both sides Augmentin prescribed Patient has steroid ear drops, okay to use it for itching ENT referral provided for evaluation of recurrent otitis media      Relevant Medications  amoxicillin-clavulanate (AUGMENTIN) 875-125 MG tablet  Other Relevant Orders  Ambulatory referral to ENT    Other   History of pulmonary embolus (PE) Had IVC, but was removed as it was fractured On Xarelto now, refill provided   Relevant Medications   XARELTO 20 MG TABS tablet    Other Visit Diagnoses    Vaginal dryness, likely atrophic vaginitis No symptoms of infection Uses estrogen cream Ob./Gyn. referral provided as per patient request    Relevant Orders   Ambulatory referral to Obstetrics / Gynecology                      Meds ordered this encounter  Medications   . amoxicillin-clavulanate (AUGMENTIN) 875-125 MG tablet    Sig: Take 1 tablet by mouth 2 (two) times daily.    Dispense:  20 tablet    Refill:  0  . XARELTO 20 MG TABS tablet    Sig: Take 1 tablet (20 mg total) by mouth daily.    Dispense:  30 tablet    Refill:  5     Esther Bradstreet Keith Rake, MD

## 2020-10-22 NOTE — Patient Instructions (Addendum)
Please start taking Augmentin twice daily.  Please avoid applying sharp objects into the ears. Okay to use ear drops for itching and dryness.  You are being referred to ENT specialist for evaluation of recurrent ear infection.  Okay to tylenol as needed for fever/pain. If you have pain behind the ears or notice bloody discharge, please go to ER immediately.  Meclizine as needed for vertigo. Otitis Media, Adult  Otitis media means that the middle ear is red and swollen (inflamed) and full of fluid. The condition usually goes away on its own. Follow these instructions at home:  Take over-the-counter and prescription medicines only as told by your doctor.  If you were prescribed an antibiotic medicine, take it as told by your doctor. Do not stop taking the antibiotic even if you start to feel better.  Keep all follow-up visits as told by your doctor. This is important. Contact a doctor if:  You have bleeding from your nose.  There is a lump on your neck.  You are not getting better in 5 days.  You feel worse instead of better. Get help right away if:  You have pain that is not helped with medicine.  You have swelling, redness, or pain around your ear.  You get a stiff neck.  You cannot move part of your face (paralyzed).  You notice that the bone behind your ear hurts when you touch it.  You get a very bad headache. Summary  Otitis media means that the middle ear is red, swollen, and full of fluid.  This condition usually goes away on its own. In some cases, treatment may be needed.  If you were prescribed an antibiotic medicine, take it as told by your doctor. This information is not intended to replace advice given to you by your health care provider. Make sure you discuss any questions you have with your health care provider. Document Revised: 11/26/2017 Document Reviewed: 01/04/2017 Elsevier Patient Education  2020 [employer].

## 2020-10-22 NOTE — Telephone Encounter (Signed)
Lvm asking patient to bring discharge instructions w medication list.

## 2020-10-22 NOTE — Telephone Encounter (Signed)
Patient brought requested records

## 2020-10-29 ENCOUNTER — Encounter: Payer: Self-pay | Admitting: Family Medicine

## 2020-10-31 ENCOUNTER — Other Ambulatory Visit: Payer: Self-pay

## 2020-10-31 ENCOUNTER — Telehealth (INDEPENDENT_AMBULATORY_CARE_PROVIDER_SITE_OTHER): Payer: Self-pay

## 2020-10-31 ENCOUNTER — Telehealth (INDEPENDENT_AMBULATORY_CARE_PROVIDER_SITE_OTHER): Payer: Medicare Other | Admitting: Family Medicine

## 2020-10-31 ENCOUNTER — Encounter: Payer: Self-pay | Admitting: Family Medicine

## 2020-10-31 ENCOUNTER — Ambulatory Visit (INDEPENDENT_AMBULATORY_CARE_PROVIDER_SITE_OTHER): Payer: Medicare Other | Admitting: Gastroenterology

## 2020-10-31 VITALS — Wt 230.0 lb

## 2020-10-31 DIAGNOSIS — D509 Iron deficiency anemia, unspecified: Secondary | ICD-10-CM

## 2020-10-31 DIAGNOSIS — N898 Other specified noninflammatory disorders of vagina: Secondary | ICD-10-CM

## 2020-10-31 DIAGNOSIS — R300 Dysuria: Secondary | ICD-10-CM

## 2020-10-31 DIAGNOSIS — Z01419 Encounter for gynecological examination (general) (routine) without abnormal findings: Secondary | ICD-10-CM

## 2020-10-31 DIAGNOSIS — R790 Abnormal level of blood mineral: Secondary | ICD-10-CM

## 2020-10-31 DIAGNOSIS — Z0000 Encounter for general adult medical examination without abnormal findings: Secondary | ICD-10-CM

## 2020-10-31 MED ORDER — MECLIZINE HCL 25 MG PO TABS: 25.0000 mg | ORAL_TABLET | Freq: Three times a day (TID) | ORAL | 0 refills | Status: DC | PRN

## 2020-10-31 MED ORDER — ESTROGENS, CONJUGATED 0.625 MG/GM VA CREA: 1.0000 | TOPICAL_CREAM | Freq: Every day | VAGINAL | 1 refills | Status: DC

## 2020-10-31 MED ORDER — FLUTICASONE PROPIONATE 50 MCG/ACT NA SUSP: 2.0000 | Freq: Every day | NASAL | 6 refills | Status: DC

## 2020-10-31 NOTE — Patient Instructions (Addendum)
F/U with video visit with MD  In 1 to 2 weeks specifically to evaluate depression, anxiety and possible ADD  You are referred to Gynecology  Meclizine is prescribed, and very important you keep the ENT appointment for chronic vertigo  Please get fasting lipid, cmp and EGFr, TSh, B12, iron, ferritin , CBC at [place] are high fall risk, please keep home clutter free and use good lighting at all times  GI  Follow up for severe esophageal spasm is needed  40 pound weight loss will help your back pain a lot  Thanks for choosing [facility], we consider it a privelige to serve you.

## 2020-10-31 NOTE — Telephone Encounter (Signed)
Patient no showed for her appointment 10/31/2020.

## 2020-10-31 NOTE — Telephone Encounter (Signed)
noted 

## 2020-10-31 NOTE — Progress Notes (Signed)
Subjective:   [name] is a 59 y.o. female who presents for Medicare Annual (Subsequent) preventive examination.  Review of Systems   C/o uncontrolled back and lower extremity pain, no relief with removal IVC filter C/o anxiety, depression , mind wandering and inability to complete a task C/o fatigue, needs labs updated, requesting iron C/o severe pin in introitus, requests gyne referral, using premarin cream regulalrly       Objective:    There were no vitals filed for this visit. There is no height or weight on file to calculate BMI.  Good communication with no confusion and intact memory. Alert and oriented x 3 No signs of respiratory distress during speech    Advanced Directives 09/16/2018 02/15/2017 12/24/2016 08/13/2016 05/13/2016 04/13/2016 03/11/2016  Does Patient Have a Medical Advance Directive? Yes No No Yes No No Yes  Type of Paramedic of [place];Living will [place];Living will - [place];Living will - - [place];Living will  Does patient want to make changes to medical advance directive? - No - Patient declined - - - - -  Copy of [place] in Chart? Yes Yes - Yes - - Yes  Would patient like information on creating a medical advance directive? - No - Patient declined No - Patient declined - No - patient declined information - -  Pre-existing out of facility DNR order (yellow form or pink MOST form) - - - - - - -  Some encounter information is confidential and restricted. Go to Review Flowsheets activity to see all data.    Current Medications (verified) Outpatient Encounter Medications as of 10/31/2020  Medication Sig  . ALPRAZolam (XANAX) 1 MG tablet Take 1 tablet (1 mg total) by mouth 2 (two) times daily.  Marland Kitchen amoxicillin-clavulanate (AUGMENTIN) 875-125 MG tablet Take 1 tablet by mouth 2 (two) times daily.  Marland Kitchen aspirin 81 MG chewable tablet Chew 81 mg by  mouth daily.  . butalbital-acetaminophen-caffeine (FIORICET) 50-325-40 MG tablet Take 1 tablet by mouth as directed.  . conjugated estrogens (PREMARIN) vaginal cream Place fingertip amount intravaginally 2 to 3 times per week  . cyanocobalamin (,VITAMIN B-12,) 1000 MCG/ML injection INJECT 1 ML INTO THE MUSCLE EVERY 30 DAYS.  Marland Kitchen cyclobenzaprine (FLEXERIL) 10 MG tablet TAKE 1 TABLET BY MOUTH TWICE DAILY AS NEEDED FOR MUSCLE SPASMS  . cycloSPORINE (RESTASIS) 0.05 % ophthalmic emulsion Place 1 drop into both eyes 2 (two) times daily.  . DULoxetine (CYMBALTA) 30 MG capsule Take 1 capsule (30 mg total) by mouth 2 (two) times daily.  Marland Kitchen estradiol (ESTRACE VAGINAL) 0.1 MG/GM vaginal cream Place 1 Applicatorful vaginally at bedtime.  Marland Kitchen ezetimibe (ZETIA) 10 MG tablet Take 1 tablet (10 mg total) by mouth daily.  . fluticasone (FLONASE) 50 MCG/ACT nasal spray Place 2 sprays into both nostrils as needed for allergies or rhinitis.  . Glycerin, Laxative, 80.7 % SUPP Place 1 suppository rectally at bedtime as needed (for constipation).   Marland Kitchen HYDROmorphone (DILAUDID) 4 MG tablet One tablet twice daily  . levothyroxine (SYNTHROID) 125 MCG tablet TAKE 1 TABLET(125 MCG) BY MOUTH DAILY BEFORE BREAKFAST  . MAGNESIUM-OXIDE 400 (241.3 Mg) MG tablet TAKE 1 TABLET(400 MG) BY MOUTH TWICE DAILY  . meclizine (ANTIVERT) 25 MG tablet Take 1 tablet (25 mg total) by mouth 3 (three) times daily as needed for dizziness.  . metoprolol tartrate (LOPRESSOR) 25 MG tablet Take 1 tablet (25 mg total) by mouth 2 (two) times daily.  Marland Kitchen  Multiple Vitamins-Minerals (ICAPS AREDS 2) CAPS Take by mouth at bedtime.  Marland Kitchen omeprazole (PRILOSEC) 40 MG capsule Take 1 capsule (40 mg total) by mouth daily.  . pravastatin (PRAVACHOL) 40 MG tablet TAKE 1 TABLET(40 MG) BY MOUTH DAILY  . promethazine (PHENERGAN) 25 MG tablet Take 1 tablet by mouth as needed.  . senna-docusate (SENOKOT-S) 8.6-50 MG per tablet Take 2 tablets by mouth 3 (three) times daily.  constipation   . Vitamin D, Ergocalciferol, (DRISDOL) 50000 units CAPS capsule Take 50,000 Units by mouth every 7 (seven) days.   Alveda Reasons 20 MG TABS tablet Take 1 tablet (20 mg total) by mouth daily.   No facility-administered encounter medications on file as of 10/31/2020.    Allergies (verified) Nitrofurantoin, Crestor [rosuvastatin calcium], Bisacodyl, Clarithromycin, Clarithromycin, Clindamycin hcl, Codeine, Monosodium glutamate, and Scopolamine hbr   History: Past Medical History:  Diagnosis Date  . Abdominal hernia 08/14/2019  . Abdominal pain 06/06/2019  . Acute bronchitis 05/01/2013  . Acute cystitis 06/09/2010   Qualifier: Diagnosis of  By: Cori Razor LPN, Brandi    . Acute renal insufficiency 08/28/2011  . Anemia    Phreesia 10/29/2020  . Anxiety   . Anxiety state 03/16/2008   Qualifier: Diagnosis of  By: Dierdre Harness    . Arthritis    Phreesia 10/29/2020  . Back pain with radiation 07/17/2014  . Bilateral hand pain 07/27/2016  . Blood transfusion without reported diagnosis    Phreesia 10/29/2020  . Cancer ([place])    Phreesia 10/29/2020  . Carpal tunnel syndrome   . Chronic constipation   . Chronic pain syndrome    followed by [facility]---  back  . Clotting disorder ([place])    Phreesia 10/29/2020  . Depression   . Dermatitis 12/15/2011  . Educated about COVID-19 virus infection 05/14/2019  . Fall at home 01/26/2016  . Family history of adverse reaction to anesthesia    MOTHER--- PONV  . Fatigue 09/17/2012  . Fibromyalgia   . FIBROMYALGIA 03/16/2008   Qualifier: Diagnosis of  By: Dierdre Harness    . GERD (gastroesophageal reflux disease)   . Headache disorder 01/16/2013  . Headache syndrome 01/26/2016  . Hip pain, right 07/08/2019  . History of MRSA infection    lip abscess  . History of ovarian cyst 06/2011   s/p  BSO  . History of pulmonary embolus (PE) 1997   post EP with ablation pulmonary veouns for SVT/ Atrial Fib.  . History of supraventricular tachycardia     s/p  ablation 1996  and 1997  by dr Caryl Comes  . History of TIA (transient ischemic attack) 1997   post op EP ablation PE  . Hyperlipidemia   . Hyperlipidemia LDL goal <100 03/16/2008   Qualifier: Diagnosis of  By: Dierdre Harness    . Hypothyroidism    followed by pcp  . Incisional hernia   . Insomnia 12/15/2011  . Intermittent palpitations 08/22/2017  . Interstitial cystitis    09-13-2018   per pt last flare-up  May 2019 (followed by pcp)  . INTERSTITIAL CYSTITIS 02/17/2011   Qualifier: Diagnosis of  By: Moshe Cipro MD, Joycelyn Schmid    . Iron deficiency anemia    09-13-2018  PER PT STABLE  . Irregular heart rate 07/25/2013  . Lipoma of back    upper  . Low back pain radiating to right leg 07/04/2019  . Low ferritin level 06/14/2014  . MDD (major depressive disorder), single episode, in full remission ([place]) 10/27/2017  . Medically noncompliant  02/28/2015   Multiple missed appointments, both follow-up appointments and lab appointments.   . Metabolic syndrome X 1/91/4782   Qualifier: Diagnosis of  By: Moshe Cipro MD, Margaret  hBA1c is 5.8 in 02/2013   . Migraines   . Morbid obesity ([place]) 03/27/2013  . Muscle spasm 01/03/2020  . Nausea alone 07/17/2014  . NECK PAIN, CHRONIC 03/16/2008   Qualifier: Diagnosis of  By: Dierdre Harness    . Normal coronary arteries    a. by CT 12/2018.  . Obesity 03/16/2008   Qualifier: Diagnosis of  By: Dierdre Harness    . Oral ulceration 08/23/2014   Presented at 06/14/2014 visit   . Other malaise and fatigue 03/16/2008   Centricity Description: FATIGUE, CHRONIC Qualifier: Diagnosis of  By: Dierdre Harness   Centricity Description: FATIGUE Qualifier: Diagnosis of  By: Christy Sartorius, [place]    . OVARIAN CYST 12/26/2009   Qualifier: Diagnosis of  By: Moshe Cipro MD, Joycelyn Schmid    . Paget disease of breast, left ([place])   . Paget's disease of breast, left ([place]) 03/16/2008   Qualifier: Diagnosis of  By: Dierdre Harness  Left diagnosed in 2006 F/h breast cancer x 15 family members  . PONV  (postoperative nausea and vomiting)    SEVERE  . Postsurgical menopause 11/13/2011  . Presence of IVC filter 06/06/2019  . PSVT (paroxysmal supraventricular tachycardia) ([place])    [place]  . Recurrent oral herpes simplex infection 10/13/2017  . ROM (right otitis media) 01/23/2013  . S/P insertion of IVC (inferior vena caval) filter 05/08/2005   [place] (non-retrievable)  /  dx 2019  a leg of filter is protruding thru the vena cava in to right L2 vertebral body (09-13-2018  per pt having surgery to remove filter in [place])  . S/P radiofrequency ablation operation for arrhythmia 1996   1996  and 1997,   SVT and Atrial Fib  . Sinusitis, chronic 01/26/2016  . SMALL BOWEL OBSTRUCTION, HX OF 08/07/2008   Annotation: obstruction w/ adhesions led to partial colectomy Qualifier: Diagnosis of  By: Craige Cotta    . Stroke [facility])    Phreesia 10/29/2020  . Swelling of hand 09/17/2012  . Syncope   . Tachycardia 02/03/2010   Qualifier: Diagnosis of  By: Via LPN, Jeani Hawking    . Thyroid disease    Phreesia 10/29/2020  . Ulcer 12/15/2011  . Urinary frequency 07/18/2014  . Vaginitis and vulvovaginitis 05/10/2013  . Vitamin D deficiency 05/05/2015  . Wears glasses    Past Surgical History:  Procedure Laterality Date  . ABDOMINAL HYSTERECTOMY  1987  . ANTERIOR CERVICAL DECOMP/DISCECTOMY FUSION  03-07-2002   dr elsner  @[facility]    C 4 -- 5  . APPENDECTOMY  1980  . AUGMENTATION MAMMAPLASTY Right 2006  . BILATERAL SALPINGOOPHORECTOMY  07/24/2011   via Explor. Lap. w/ intraoperative perf. bowel repair  . BREAST BIOPSY Right 2019   benign  . BREAST ENHANCEMENT SURGERY Bilateral 1993  . BREAST IMPLANT REMOVAL Bilateral   . BREAST SURGERY N/A    Phreesia 10/29/2020  . CARDIAC CATHETERIZATION  07-06-2003   dr Darnell Level brodie   normal coronaries and LVF  . CARDIAC ELECTROPHYSIOLOGY STUDY AND ABLATION  1996  and 1997  . CARDIOVASCULAR STRESS TEST  11-18-2015   dr Caryl Comes   normal nuclear study w/ no  ischemia/  normal LV function and wall motion , ef 84%  . CARPAL TUNNEL RELEASE Right ?  . [place]  . CESAREAN SECTION N/A  Phreesia 10/29/2020  . CHOLECYSTECTOMY N/A    Phreesia 10/29/2020  . COLON SURGERY    . CYSTO/  HYDRODISTENTION/  INSTILATION THERAPY  MULTIPLE  . ENTEROCUTANEOUS FISTULA CLOSURE  multiple   last one 2015 with small bowel resection  . EXPLORATORY LAPAROTOMY INCISIONAL VENTRAL HERNIA REPAIR / RESECTION SMALL BOWEL  11-09-2014   @[facility]   . FRACTURE SURGERY N/A    Phreesia 10/29/2020  . JOINT REPLACEMENT N/A    Phreesia 10/29/2020  . LIPOMA EXCISION Right 09/16/2018   Procedure: EXCISION LIPOMA UPPER BACK;  Surgeon: Clovis Riley, MD;  Location: [place];  Service: General;  Laterality: Right;  . MASTECTOMY Left 2006   w/ reconstruction on left (paget's disease)  and right breast augmentation  . SMALL INTESTINE SURGERY N/A    Phreesia 10/29/2020  . SPINE SURGERY N/A    Phreesia 10/29/2020  . TOTAL COLECTOMY  08-04-2002    @[facility]    AND CHOLECYSTECTOMY  (colonic inertia)  . TRANSTHORACIC ECHOCARDIOGRAM  02/04/2016   ef 60-65%,  grade 2 diastolic dysfunction/  mild MR  . VENA CAVA FILTER PLACEMENT  05/08/2005   @[facility]    [facility] (non-retrievable)  . WIDE EXCISION PERIRECTAL ABSCESSES  09-22-2005   @ [facility]   Family History  Problem Relation Age of Onset  . Diabetes Mother   . Heart disease Mother   . Hypertension Mother   . Heart disease Father   . Hyperlipidemia Father   . Hypertension Father   . Alcohol abuse Father   . Colon cancer Maternal Aunt   . Breast cancer Maternal Aunt   . Cancer Maternal Uncle        mets  . Bone cancer Maternal Grandfather        mets  . Ovarian cancer Cousin 19  . Breast cancer Cousin   . Prostate cancer Maternal Uncle    Social History   Socioeconomic History  . Marital status: Married    Spouse name: Not on file  . Number of children: Not on file  . Years of education: Not on  file  . Highest education level: Not on file  Occupational History  . Not on file  Tobacco Use  . Smoking status: Never Smoker  . Smokeless tobacco: Never Used  Vaping Use  . Vaping Use: Never used  Substance and Sexual Activity  . Alcohol use: No  . Drug use: No  . Sexual activity: Yes    Birth control/protection: Surgical  Other Topics Concern  . Not on file  Social History Narrative  . Not on file   Social Determinants of Health   Financial Resource Strain:   . Difficulty of Paying Living Expenses: Not on file  Food Insecurity:   . Worried About Charity fundraiser in the Last Year: Not on file  . Ran Out of Food in the Last Year: Not on file  Transportation Needs:   . Lack of Transportation (Medical): Not on file  . Lack of Transportation (Non-Medical): Not on file  Physical Activity:   . Days of Exercise per Week: Not on file  . Minutes of Exercise per Session: Not on file  Stress:   . Feeling of Stress : Not on file  Social Connections:   . Frequency of Communication with Friends and Family: Not on file  . Frequency of Social Gatherings with Friends and Family: Not on file  . Attends Religious Services: Not on file  . Active Member of Clubs or Organizations: Not  on file  . Attends Archivist Meetings: Not on file  . Marital Status: Not on file    Tobacco Counseling Counseling given: Not Answered  Non smoker  Clinical Intake:                 Diabetic?no         Activities of Daily Living In your present state of health, do you have any difficulty performing the following activities: 10/22/2020  Hearing? N  Vision? N  Difficulty concentrating or making decisions? N  Walking or climbing stairs? Y  Dressing or bathing? N  Doing errands, shopping? N  Some recent data might be hidden    Patient Care Team: Fayrene Helper, MD as PCP - General Deboraha Sprang, MD as PCP - Cardiology (Cardiology) Selinda Eon (Inactive) as  Referring Physician Ray, Debroah Loop, MD (Anesthesiology) Hennie Duos, MD as Consulting Physician (Rheumatology) Ray, Debroah Loop, MD as Referring Physician (Anesthesiology) Lovenia Shuck, MD as Referring Physician (Cardiothoracic Surgery)  Indicate any recent Medical Services you may have received from other than [facility] providers in the past year (date may be approximate).     Assessment:   This is a routine wellness examination for [name].  Hearing/Vision screen No exam data present  Dietary issues and exercise activities discussed:    Goals   None    Depression Screen PHQ 2/9 Scores 10/22/2020 07/08/2020 01/03/2020 10/17/2019 07/04/2019 06/06/2019 05/08/2019  PHQ - 2 Score 0 0 0 0 0 1 0  PHQ- 9 Score 11 - - - - - -    Fall Risk Fall Risk  10/22/2020 07/08/2020 01/03/2020 10/17/2019 08/08/2019  Falls in the past year? 0 0 0 0 0  Number falls in past yr: 0 - 0 0 0  Injury with Fall? 0 - 0 0 0  Risk for fall due to : No Fall Risks - - - -  Follow up Falls evaluation completed - - - -    Any stairs in or around the home? No  If so, are there any without handrails? No  Home free of loose throw rugs in walkways, pet beds, electrical cords, etc? Yes  Adequate lighting in your home to reduce risk of falls? Yes   ASSISTIVE DEVICES UTILIZED TO PREVENT FALLS:  Life alert? No  Use of a cane, walker or w/c? No  Grab bars in the bathroom? No  Shower chair or bench in shower? No  Elevated toilet seat or a handicapped toilet? No   TIMED UP AND GO:  Was the test performed? No .  Length of time to ambulate 10 feet:   Cognitive Function:     6CIT Screen 05/03/2019  What Year? 0 points  What month? 0 points  What time? 0 points  Count back from 20 0 points  Months in reverse 0 points  Repeat phrase 0 points  Total Score 0    Immunizations Immunization History  Administered Date(s) Administered  . Hepatitis A 06/27/2009, 12/26/2009  . Hepatitis A, Adult 06/27/2009, 12/26/2009    . Hepatitis B 06/27/2009, 08/06/2009, 12/26/2009  . Influenza Split 09/15/2012  . Influenza Whole 09/13/2007, 10/01/2008, 09/09/2010, 09/23/2011  . Influenza, Seasonal, Injecte, Preservative Fre 10/08/2014  . Influenza,inj,Quad PF,6+ Mos 10/25/2013, 09/23/2015, 09/18/2016, 10/11/2017, 08/25/2018, 10/17/2019, 10/05/2020  . Influenza-Unspecified 09/15/2012, 09/27/2013  . Moderna SARS-COVID-2 Vaccination 03/18/2020, 04/22/2020  . Pneumococcal Polysaccharide-23 09/23/2011  . Td 10/27/2005  . Tdap 12/22/2016    TDAP status: Up to date Flu Vaccine  status: Up to date Pneumococcal vaccine status: Up to date Covid-19 vaccine status: Completed vaccines  Qualifies for Shingles Vaccine? No   Zostavax completed No   Shingrix Completed?: No.    Education has been provided regarding the importance of this vaccine. Patient has been advised to call insurance company to determine out of pocket expense if they have not yet received this vaccine. Advised may also receive vaccine at local pharmacy or Health Dept. Verbalized acceptance and understanding.  Screening Tests Health Maintenance  Topic Date Due  . COLONOSCOPY  11/04/2011  . PAP SMEAR-Modifier  12/17/2019  . MAMMOGRAM  04/08/2022  . TETANUS/TDAP  12/22/2026  . INFLUENZA VACCINE  Completed  . COVID-19 Vaccine  Completed  . Hepatitis C Screening  Completed  . HIV Screening  Completed    Health Maintenance  Health Maintenance Due  Topic Date Due  . COLONOSCOPY  11/04/2011  . PAP SMEAR-Modifier  12/17/2019     Mammogram status: Completed 04/08/20. Repeat every year   Lung Cancer Screening: (Low Dose CT Chest recommended if Age 74-80 years, 30 pack-year currently smoking OR have quit w/in 15years.) does not qualify.   Lung Cancer Screening Referral: does not have a 30 pack year h/o smoking and is a non smoker currently  Additional Screening:  Hepatitis C Screening: does not qualify; Completed negative in 2017  Vision Screening:  Recommended annual ophthalmology exams for early detection of glaucoma and other disorders of the eye. Is the patient up to date with their annual eye exam?  Yes  Who is the provider or what is the name of the office in which the patient attends annual eye exams?  If pt is not established with a provider, would they like to be referred to a provider to establish care? No .   Dental Screening: Recommended annual dental exams for proper oral hygiene  Community Resource Referral / Chronic Care Management: CRR required this visit?  No   CCM required this visit?  No      Plan:     I have personally reviewed and noted the following in the patient's chart:   . Medical and social history . Use of alcohol, tobacco or illicit drugs  . Current medications and supplements . Functional ability and status . Nutritional status . Physical activity . Advanced directives . List of other physicians . Hospitalizations, surgeries, and ER visits in previous 12 months . Vitals . Screenings to include cognitive, depression, and falls . Referrals and appointments  In addition, I have reviewed and discussed with patient certain preventive protocols, quality metrics, and best practice recommendations. A written personalized care plan for preventive services as well as general preventive health recommendations were provided to patient.     Philis Pique Henrine Hayter, CMA   10/31/2020   Nurse Notes:  I personally spoke with pt via visdeo . Main concerns addressed and referrals for

## 2020-11-02 ENCOUNTER — Encounter: Payer: Self-pay | Admitting: Family Medicine

## 2020-11-08 ENCOUNTER — Ambulatory Visit (INDEPENDENT_AMBULATORY_CARE_PROVIDER_SITE_OTHER): Payer: Medicare Other | Admitting: Otolaryngology

## 2020-11-11 ENCOUNTER — Other Ambulatory Visit: Payer: Self-pay | Admitting: Family Medicine

## 2020-11-11 DIAGNOSIS — R5382 Chronic fatigue, unspecified: Secondary | ICD-10-CM

## 2020-11-11 NOTE — Progress Notes (Signed)
amb  

## 2020-11-12 ENCOUNTER — Encounter: Payer: Medicare Other | Admitting: Family Medicine

## 2020-11-14 ENCOUNTER — Telehealth: Payer: Medicare Other | Admitting: Family Medicine

## 2020-11-14 ENCOUNTER — Encounter (INDEPENDENT_AMBULATORY_CARE_PROVIDER_SITE_OTHER): Payer: Self-pay | Admitting: Otolaryngology

## 2020-11-14 ENCOUNTER — Ambulatory Visit (INDEPENDENT_AMBULATORY_CARE_PROVIDER_SITE_OTHER): Payer: Medicare Other | Admitting: Otolaryngology

## 2020-11-14 ENCOUNTER — Other Ambulatory Visit: Payer: Self-pay

## 2020-11-14 VITALS — Temp 97.3°F

## 2020-11-14 DIAGNOSIS — R490 Dysphonia: Secondary | ICD-10-CM | POA: Diagnosis not present

## 2020-11-14 DIAGNOSIS — H8111 Benign paroxysmal vertigo, right ear: Secondary | ICD-10-CM | POA: Diagnosis not present

## 2020-11-14 DIAGNOSIS — K219 Gastro-esophageal reflux disease without esophagitis: Secondary | ICD-10-CM | POA: Diagnosis not present

## 2020-11-14 NOTE — Progress Notes (Signed)
HPI: [name] is a 59 y.o. female who presents is referred by Dr.Patel For evaluation of history of ear infections as well as episodes of vertigo.  She relates that she has history of right sided migraines and sometimes has dizziness associated with these.  She also states that at night when she turns to her right she will sometimes have a short episode of vertigo.  She apparently does have history of more hearing loss in the right ear compared to the left. She also complains of some change in her voice.  This is worse when she sings.  She wanted her voice checked.  She has only minimal hoarseness in the office today. She does have history of GERD and takes omeprazole twice daily prescribed by her gastroenterologist. She does not smoke.Marland Kitchen  Past Medical History:  Diagnosis Date  . Abdominal hernia 08/14/2019  . Abdominal pain 06/06/2019  . Acute bronchitis 05/01/2013  . Acute cystitis 06/09/2010   Qualifier: Diagnosis of  By: Cori Razor LPN, Brandi    . Acute renal insufficiency 08/28/2011  . Anemia    Phreesia 10/29/2020  . Anxiety   . Anxiety state 03/16/2008   Qualifier: Diagnosis of  By: Dierdre Harness    . Arthritis    Phreesia 10/29/2020  . Back pain with radiation 07/17/2014  . Bilateral hand pain 07/27/2016  . Blood transfusion without reported diagnosis    Phreesia 10/29/2020  . Cancer ([place])    Phreesia 10/29/2020  . Carpal tunnel syndrome   . Chronic constipation   . Chronic pain syndrome    followed by [facility]---  back  . Clotting disorder ([place])    Phreesia 10/29/2020  . Depression   . Dermatitis 12/15/2011  . Educated about COVID-19 virus infection 05/14/2019  . Fall at home 01/26/2016  . Family history of adverse reaction to anesthesia    MOTHER--- PONV  . Fatigue 09/17/2012  . Fibromyalgia   . FIBROMYALGIA 03/16/2008   Qualifier: Diagnosis of  By: Dierdre Harness    . GERD (gastroesophageal reflux disease)   . Headache disorder 01/16/2013  . Headache syndrome  01/26/2016  . Hip pain, right 07/08/2019  . History of MRSA infection    lip abscess  . History of ovarian cyst 06/2011   s/p  BSO  . History of pulmonary embolus (PE) 1997   post EP with ablation pulmonary veouns for SVT/ Atrial Fib.  . History of supraventricular tachycardia    s/p  ablation 1996  and 1997  by dr Caryl Comes  . History of TIA (transient ischemic attack) 1997   post op EP ablation PE  . Hyperlipidemia   . Hyperlipidemia LDL goal <100 03/16/2008   Qualifier: Diagnosis of  By: Dierdre Harness    . Hypothyroidism    followed by pcp  . Incisional hernia   . Insomnia 12/15/2011  . Intermittent palpitations 08/22/2017  . Interstitial cystitis    09-13-2018   per pt last flare-up  May 2019 (followed by pcp)  . INTERSTITIAL CYSTITIS 02/17/2011   Qualifier: Diagnosis of  By: Moshe Cipro MD, Joycelyn Schmid    . Iron deficiency anemia    09-13-2018  PER PT STABLE  . Irregular heart rate 07/25/2013  . Lipoma of back    upper  . Low back pain radiating to right leg 07/04/2019  . Low ferritin level 06/14/2014  . MDD (major depressive disorder), single episode, in full remission ([place]) 10/27/2017  . Medically noncompliant 02/28/2015   Multiple missed appointments, both  follow-up appointments and lab appointments.   . Metabolic syndrome X 7/59/1638   Qualifier: Diagnosis of  By: Moshe Cipro MD, Margaret  hBA1c is 5.8 in 02/2013   . Migraines   . Morbid obesity ([place]) 03/27/2013  . Muscle spasm 01/03/2020  . Nausea alone 07/17/2014  . NECK PAIN, CHRONIC 03/16/2008   Qualifier: Diagnosis of  By: Dierdre Harness    . Normal coronary arteries    a. by CT 12/2018.  . Obesity 03/16/2008   Qualifier: Diagnosis of  By: Dierdre Harness    . Oral ulceration 08/23/2014   Presented at 06/14/2014 visit   . Other malaise and fatigue 03/16/2008   Centricity Description: FATIGUE, CHRONIC Qualifier: Diagnosis of  By: Dierdre Harness   Centricity Description: FATIGUE Qualifier: Diagnosis of  By: Christy Sartorius, [place]    . OVARIAN  CYST 12/26/2009   Qualifier: Diagnosis of  By: Moshe Cipro MD, Joycelyn Schmid    . Paget disease of breast, left ([place])   . Paget's disease of breast, left ([place]) 03/16/2008   Qualifier: Diagnosis of  By: Dierdre Harness  Left diagnosed in 2006 F/h breast cancer x 15 family members  . PONV (postoperative nausea and vomiting)    SEVERE  . Postsurgical menopause 11/13/2011  . Presence of IVC filter 06/06/2019  . PSVT (paroxysmal supraventricular tachycardia) ([place])    [place]  . Recurrent oral herpes simplex infection 10/13/2017  . ROM (right otitis media) 01/23/2013  . S/P insertion of IVC (inferior vena caval) filter 05/08/2005   [place] (non-retrievable)  /  dx 2019  a leg of filter is protruding thru the vena cava in to right L2 vertebral body (09-13-2018  per pt having surgery to remove filter in [place])  . S/P radiofrequency ablation operation for arrhythmia 1996   1996  and 1997,   SVT and Atrial Fib  . Sinusitis, chronic 01/26/2016  . SMALL BOWEL OBSTRUCTION, HX OF 08/07/2008   Annotation: obstruction w/ adhesions led to partial colectomy Qualifier: Diagnosis of  By: Craige Cotta    . Stroke [facility])    Phreesia 10/29/2020  . Swelling of hand 09/17/2012  . Syncope   . Tachycardia 02/03/2010   Qualifier: Diagnosis of  By: Via LPN, Jeani Hawking    . Thyroid disease    Phreesia 10/29/2020  . Ulcer 12/15/2011  . Urinary frequency 07/18/2014  . Vaginitis and vulvovaginitis 05/10/2013  . Vitamin D deficiency 05/05/2015  . Wears glasses    Past Surgical History:  Procedure Laterality Date  . ABDOMINAL HYSTERECTOMY  1987  . ANTERIOR CERVICAL DECOMP/DISCECTOMY FUSION  03-07-2002   dr elsner  @[facility]    C 4 -- 5  . APPENDECTOMY  1980  . AUGMENTATION MAMMAPLASTY Right 2006  . BILATERAL SALPINGOOPHORECTOMY  07/24/2011   via Explor. Lap. w/ intraoperative perf. bowel repair  . BREAST BIOPSY Right 2019   benign  . BREAST ENHANCEMENT SURGERY Bilateral 1993  . BREAST IMPLANT REMOVAL Bilateral   .  BREAST SURGERY N/A    Phreesia 10/29/2020  . CARDIAC CATHETERIZATION  07-06-2003   dr Darnell Level brodie   normal coronaries and LVF  . CARDIAC ELECTROPHYSIOLOGY STUDY AND ABLATION  1996  and 1997  . CARDIOVASCULAR STRESS TEST  11-18-2015   dr Caryl Comes   normal nuclear study w/ no ischemia/  normal LV function and wall motion , ef 84%  . CARPAL TUNNEL RELEASE Right ?  . [place]  . CESAREAN SECTION N/A    Phreesia 10/29/2020  .  CHOLECYSTECTOMY N/A    Phreesia 10/29/2020  . COLON SURGERY    . CYSTO/  HYDRODISTENTION/  INSTILATION THERAPY  MULTIPLE  . ENTEROCUTANEOUS FISTULA CLOSURE  multiple   last one 2015 with small bowel resection  . EXPLORATORY LAPAROTOMY INCISIONAL VENTRAL HERNIA REPAIR / RESECTION SMALL BOWEL  11-09-2014   @[facility]   . FRACTURE SURGERY N/A    Phreesia 10/29/2020  . JOINT REPLACEMENT N/A    Phreesia 10/29/2020  . LIPOMA EXCISION Right 09/16/2018   Procedure: EXCISION LIPOMA UPPER BACK;  Surgeon: Clovis Riley, MD;  Location: [facility];  Service: General;  Laterality: Right;  . MASTECTOMY Left 2006   w/ reconstruction on left (paget's disease)  and right breast augmentation  . SMALL INTESTINE SURGERY N/A    Phreesia 10/29/2020  . SPINE SURGERY N/A    Phreesia 10/29/2020  . TOTAL COLECTOMY  08-04-2002    @[facility]    AND CHOLECYSTECTOMY  (colonic inertia)  . TRANSTHORACIC ECHOCARDIOGRAM  02/04/2016   ef 60-65%,  grade 2 diastolic dysfunction/  mild MR  . VENA CAVA FILTER PLACEMENT  05/08/2005   @[facility]    [facility] (non-retrievable)  . WIDE EXCISION PERIRECTAL ABSCESSES  09-22-2005   @ [facility]   Social History   Socioeconomic History  . Marital status: Married    Spouse name: Not on file  . Number of children: Not on file  . Years of education: Not on file  . Highest education level: Not on file  Occupational History  . Not on file  Tobacco Use  . Smoking status: Never Smoker  . Smokeless tobacco: Never Used  Vaping Use  . Vaping Use:  Never used  Substance and Sexual Activity  . Alcohol use: No  . Drug use: No  . Sexual activity: Yes    Birth control/protection: Surgical  Other Topics Concern  . Not on file  Social History Narrative  . Not on file   Social Determinants of Health   Financial Resource Strain: Medium Risk  . Difficulty of Paying Living Expenses: Somewhat hard  Food Insecurity: Food Insecurity Present  . Worried About Charity fundraiser in the Last Year: Sometimes true  . Ran Out of Food in the Last Year: Often true  Transportation Needs: No Transportation Needs  . Lack of Transportation (Medical): No  . Lack of Transportation (Non-Medical): No  Physical Activity: Inactive  . Days of Exercise per Week: 0 days  . Minutes of Exercise per Session: 0 min  Stress: Stress Concern Present  . Feeling of Stress : Rather much  Social Connections: Moderately Integrated  . Frequency of Communication with Friends and Family: More than three times a week  . Frequency of Social Gatherings with Friends and Family: Once a week  . Attends Religious Services: Never  . Active Member of Clubs or Organizations: Yes  . Attends Archivist Meetings: Never  . Marital Status: Married   Family History  Problem Relation Age of Onset  . Diabetes Mother   . Heart disease Mother   . Hypertension Mother   . Heart disease Father   . Hyperlipidemia Father   . Hypertension Father   . Alcohol abuse Father   . Colon cancer Maternal Aunt   . Breast cancer Maternal Aunt   . Cancer Maternal Uncle        mets  . Bone cancer Maternal Grandfather        mets  . Ovarian cancer Cousin 85  . Breast cancer Cousin   .  Prostate cancer Maternal Uncle    Allergies  Allergen Reactions  . Nitrofurantoin Hives  . Crestor [Rosuvastatin Calcium] Other (See Comments)    Generalized cramps  . Bisacodyl Other (See Comments)    Makes patient feel like she is having cramps Makes patient feel like she is having cramps  .  Clarithromycin Hives and Other (See Comments)    Other reaction(s): Other (See Comments) Cluster migraines  Cluster migraines  . Clarithromycin Other (See Comments)    Cluster migraines   . Clindamycin Hcl Hives  . Codeine Itching  . Monosodium Glutamate Other (See Comments)    Cluster migraines  Cluster migraines  . Scopolamine Hbr Other (See Comments)    Cluster migraines, impaired vision  . Other Hives, Itching, Rash and Swelling   Prior to Admission medications   Medication Sig Start Date End Date Taking? Authorizing Provider  ALPRAZolam [name]) 1 MG tablet Take 1 tablet (1 mg total) by mouth 2 (two) times daily. 09/27/20  Yes Fayrene Helper, MD  amoxicillin-clavulanate (AUGMENTIN) 875-125 MG tablet Take 1 tablet by mouth 2 (two) times daily. 10/22/20  Yes Lindell Spar, MD  aspirin 81 MG chewable tablet Chew 81 mg by mouth daily.   Yes [provider]  butalbital-acetaminophen-caffeine (FIORICET) 50-325-40 MG tablet Take 1 tablet by mouth as directed. 06/19/19  Yes [provider]  conjugated estrogens (PREMARIN) vaginal cream Place fingertip amount intravaginally 2 to 3 times per week 07/12/20  Yes Fayrene Helper, MD  conjugated estrogens (PREMARIN) vaginal cream Place 1 Applicatorful vaginally at bedtime. Apply sparingly fingertip at bedtime 10/31/20  Yes Fayrene Helper, MD  cyanocobalamin (,VITAMIN B-12,) 1000 MCG/ML injection INJECT 1 ML INTO THE MUSCLE EVERY 30 DAYS. 10/10/18  Yes [provider]  cyclobenzaprine (FLEXERIL) 10 MG tablet TAKE 1 TABLET BY MOUTH TWICE DAILY AS NEEDED FOR MUSCLE SPASMS 02/21/20  Yes Fayrene Helper, MD  cycloSPORINE (RESTASIS) 0.05 % ophthalmic emulsion Place 1 drop into both eyes 2 (two) times daily. 05/10/17  Yes [provider]  estradiol (ESTRACE VAGINAL) 0.1 MG/GM vaginal cream Place 1 Applicatorful vaginally at bedtime. 07/24/20  Yes Fayrene Helper, MD  ezetimibe (ZETIA) 10 MG tablet  Take 1 tablet (10 mg total) by mouth daily. 07/17/20  Yes Fayrene Helper, MD  fluticasone (FLONASE) 50 MCG/ACT nasal spray Place 2 sprays into both nostrils daily. 10/31/20  Yes Fayrene Helper, MD  Glycerin, Laxative, 80.7 % SUPP Place 1 suppository rectally at bedtime as needed (for constipation).  01/20/15  Yes [provider]  HYDROmorphone (DILAUDID) 4 MG tablet One tablet twice daily 09/01/16  Yes Fayrene Helper, MD  levothyroxine (SYNTHROID) 125 MCG tablet TAKE 1 TABLET(125 MCG) BY MOUTH DAILY BEFORE BREAKFAST 08/14/20  Yes Perlie Mayo, NP  MAGNESIUM-OXIDE 400 (241.3 Mg) MG tablet TAKE 1 TABLET(400 MG) BY MOUTH TWICE DAILY 01/31/20  Yes Dunn, Nedra Hai, PA-C  meclizine (ANTIVERT) 25 MG tablet Take 1 tablet (25 mg total) by mouth 3 (three) times daily as needed for dizziness. 10/31/20  Yes Fayrene Helper, MD  metoprolol tartrate (LOPRESSOR) 25 MG tablet Take 1 tablet (25 mg total) by mouth 2 (two) times daily. 09/04/20  Yes Deboraha Sprang, MD  Multiple Vitamins-Minerals (ICAPS AREDS 2) CAPS Take by mouth at bedtime.   Yes [provider]  omeprazole (PRILOSEC) 40 MG capsule Take 1 capsule (40 mg total) by mouth daily. 05/28/20  Yes Fayrene Helper, MD  pravastatin (PRAVACHOL) 40 MG  tablet TAKE 1 TABLET(40 MG) BY MOUTH DAILY 07/23/20  Yes Fayrene Helper, MD  promethazine (PHENERGAN) 25 MG tablet Take 1 tablet by mouth as needed. 07/29/18  Yes [provider]  senna-docusate (SENOKOT-S) 8.6-50 MG per tablet Take 2 tablets by mouth 3 (three) times daily. constipation    Yes [provider]  Vitamin D, Ergocalciferol, (DRISDOL) 50000 units CAPS capsule Take 50,000 Units by mouth every 7 (seven) days.    Yes [provider]  XARELTO 20 MG TABS tablet Take 1 tablet (20 mg total) by mouth daily. 10/22/20  Yes Lindell Spar, MD     Positive ROS: Otherwise negative  All other systems have been reviewed and were otherwise negative with the  exception of those mentioned in the HPI and as above.  Physical Exam: Constitutional: Alert, well-appearing, no acute distress Ears: External ears without lesions or tenderness.  Ear canals and TMs are clear bilaterally.  On screening hearing with a tuning forks she hears better on the left side than the right.  On Dix-Hallpike testin she has nystagmus with her head turned to the right consistent with BPPV.  I performed the Epley maneuver with her today in the office and this seemed to relieve her BPPV when she laid back and turned her head to the right. Nasal: External nose without lesions. Septum with mild deformity and moderate rhinitis..  Clear mucus discharge.  No signs of purulent discharge. Oral: Lips and gums without lesions. Tongue and palate mucosa without lesions. Posterior oropharynx clear. Fiberoptic laryngoscopy was performed to the right nostril.  On fiberoptic laryngoscopy the nasopharynx was clear.  Eustachian tube area is not obstructed.  The base of tongue vallecula and epiglottis were normal.  Vocal cords were clear bilaterally with normal vocal mobility and no vocal cord lesions noted.  No polyps or nodules noted.  She did have mild edema of the arytenoid mucosa consistent with probable laryngeal pharyngeal reflux which she is presently being treated for with omeprazole twice daily. Neck: No palpable adenopathy or masses Respiratory: Breathing comfortably  Skin: No facial/neck lesions or rash noted.  Laryngoscopy  Date/Time: 11/14/2020 4:51 PM Performed by: Rozetta Nunnery, MD Authorized by: Rozetta Nunnery, MD   Consent:    Consent obtained:  Verbal   Consent given by:  Patient Procedure details:    Indications: hoarseness, dysphagia, or aspiration     Medication:  Afrin   Instrument: flexible fiberoptic laryngoscope     Scope location: right nare   Sinus:    Right nasopharynx: normal   Mouth:    Oropharynx: normal     Vallecula: normal     Base of  tongue: normal     Epiglottis: normal   Throat:    Pyriform sinus: normal     True vocal cords: normal   Comments:     On fiberoptic laryngoscopy the vocal cords were clear bilaterally with no nodules polyps or lesions. Vocal cords had normal mobility. She does have mild edema of the arytenoid mucosa consistent with reflux type symptoms. This may also be contributing to her hoarseness.    Assessment: Benign paroxysmal positional vertigo. Hoarseness which is mild but seems to give her more difficulty when she is singing. Normal vocal cord examination on fiberoptic laryngoscopy. This may be related to reflux for which she is presently being treated.  Plan: Reviewed with her concerning BPPV and gave her information on BPPV.  Reviewed the Epley maneuver with her And performed this with  her in the office. She will follow-up in 2 to 3 weeks for recheck and audiologic testing as she has diminished hearing on the right side.   Radene Journey, MD   CC:

## 2020-11-25 ENCOUNTER — Other Ambulatory Visit: Payer: Self-pay | Admitting: Physician Assistant

## 2020-12-03 ENCOUNTER — Ambulatory Visit (HOSPITAL_COMMUNITY): Payer: Medicare Other | Admitting: Hematology and Oncology

## 2020-12-03 DIAGNOSIS — C569 Malignant neoplasm of unspecified ovary: Secondary | ICD-10-CM | POA: Insufficient documentation

## 2020-12-03 NOTE — Assessment & Plan Note (Deleted)
1997: Unprovoked pulmonary embolism: Hypercoagulability work-up negative initially on Coumadin long-term but it was discontinued when she developed hemorrhage at the time of breast surgery.  IVC filter was placed.   [place]: IVC filter removed 10/07/2020

## 2020-12-03 NOTE — Assessment & Plan Note (Deleted)
Secondary to malabsorption due to extensive GI surgeries Patient also gets B12 injections at home Follows with Dr. Federico Flake (hematology oncology): [facility]

## 2020-12-03 NOTE — Assessment & Plan Note (Deleted)
2012 resume to diagnosis of ovarian cancer when she had elevated Ca1 25.  Surgery was performed at [facility] complicated by sepsis requiring reoperation.

## 2020-12-03 NOTE — Assessment & Plan Note (Deleted)
Diagnosis 2016 treated with mastectomy and lymph node removal and TRAM flap reconstruction at [facility].

## 2020-12-06 ENCOUNTER — Ambulatory Visit (INDEPENDENT_AMBULATORY_CARE_PROVIDER_SITE_OTHER): Payer: Medicare Other | Admitting: Otolaryngology

## 2021-01-02 ENCOUNTER — Other Ambulatory Visit: Payer: Self-pay | Admitting: Family Medicine

## 2021-01-09 ENCOUNTER — Telehealth (INDEPENDENT_AMBULATORY_CARE_PROVIDER_SITE_OTHER): Payer: Self-pay | Admitting: *Deleted

## 2021-01-09 ENCOUNTER — Ambulatory Visit (INDEPENDENT_AMBULATORY_CARE_PROVIDER_SITE_OTHER): Payer: Medicare Other | Admitting: Gastroenterology

## 2021-01-09 NOTE — Telephone Encounter (Signed)
Patient was a no show for her visit today ,01/09/2021 with Dr.Daniel Jenetta Downer.

## 2021-01-10 ENCOUNTER — Telehealth: Payer: Self-pay | Admitting: Family Medicine

## 2021-01-10 ENCOUNTER — Telehealth: Payer: Self-pay | Admitting: *Deleted

## 2021-01-10 ENCOUNTER — Other Ambulatory Visit: Payer: Self-pay | Admitting: Family Medicine

## 2021-01-10 ENCOUNTER — Other Ambulatory Visit: Payer: Self-pay | Admitting: *Deleted

## 2021-01-10 MED ORDER — ALPRAZOLAM 1 MG PO TABS: 1.0000 mg | ORAL_TABLET | Freq: Two times a day (BID) | ORAL | 0 refills | Status: DC

## 2021-01-10 MED ORDER — EZETIMIBE 10 MG PO TABS
10.0000 mg | ORAL_TABLET | Freq: Every day | ORAL | 3 refills | Status: DC
Start: 2021-01-10 — End: 2021-09-28

## 2021-01-10 MED ORDER — LEVOTHYROXINE SODIUM 125 MCG PO TABS
ORAL_TABLET | ORAL | 1 refills | Status: DC
Start: 2021-01-10 — End: 2021-01-15

## 2021-01-10 NOTE — Telephone Encounter (Signed)
pls schedule a virtual visit with me in the next 1 to 2 weeks, for medicaion management of anxiety and insomnia. Thanks!

## 2021-01-10 NOTE — Telephone Encounter (Signed)
One month is prescribed. She needs a virtual visit with me to address anxiety, sleep and depression before any additional refills are sent, please let her know, you may also schedule the appointment if possible, if not I am sending Gwinda Passe a message also Thanks!

## 2021-01-10 NOTE — Telephone Encounter (Signed)
Pt is requesting refill on aprazolam she is out of the medication

## 2021-01-13 ENCOUNTER — Ambulatory Visit (HOSPITAL_COMMUNITY): Payer: Medicare Other | Admitting: Hematology

## 2021-01-14 NOTE — Telephone Encounter (Signed)
Scheduled for 11/05/21

## 2021-01-15 ENCOUNTER — Encounter: Payer: Self-pay | Admitting: Family Medicine

## 2021-01-15 ENCOUNTER — Telehealth (INDEPENDENT_AMBULATORY_CARE_PROVIDER_SITE_OTHER): Payer: Medicare Other | Admitting: Family Medicine

## 2021-01-15 ENCOUNTER — Other Ambulatory Visit: Payer: Self-pay

## 2021-01-15 VITALS — Ht 64.5 in | Wt 232.0 lb

## 2021-01-15 DIAGNOSIS — M62838 Other muscle spasm: Secondary | ICD-10-CM

## 2021-01-15 DIAGNOSIS — I10 Essential (primary) hypertension: Secondary | ICD-10-CM

## 2021-01-15 DIAGNOSIS — F4322 Adjustment disorder with anxiety: Secondary | ICD-10-CM

## 2021-01-15 DIAGNOSIS — G4489 Other headache syndrome: Secondary | ICD-10-CM

## 2021-01-15 DIAGNOSIS — F901 Attention-deficit hyperactivity disorder, predominantly hyperactive type: Secondary | ICD-10-CM

## 2021-01-15 DIAGNOSIS — E559 Vitamin D deficiency, unspecified: Secondary | ICD-10-CM

## 2021-01-15 DIAGNOSIS — E038 Other specified hypothyroidism: Secondary | ICD-10-CM

## 2021-01-15 DIAGNOSIS — Z95828 Presence of other vascular implants and grafts: Secondary | ICD-10-CM

## 2021-01-15 DIAGNOSIS — E785 Hyperlipidemia, unspecified: Secondary | ICD-10-CM | POA: Diagnosis not present

## 2021-01-15 MED ORDER — PRAVASTATIN SODIUM 40 MG PO TABS
ORAL_TABLET | ORAL | 3 refills | Status: DC
Start: 2021-01-15 — End: 2022-01-08

## 2021-01-15 MED ORDER — LEVOTHYROXINE SODIUM 125 MCG PO TABS
ORAL_TABLET | ORAL | 1 refills | Status: DC
Start: 2021-01-15 — End: 2021-02-18

## 2021-01-15 MED ORDER — METOPROLOL TARTRATE 25 MG PO TABS
25.0000 mg | ORAL_TABLET | Freq: Two times a day (BID) | ORAL | 3 refills | Status: DC
Start: 2021-01-15 — End: 2021-12-05

## 2021-01-15 MED ORDER — OMEPRAZOLE 40 MG PO CPDR: 40.0000 mg | DELAYED_RELEASE_CAPSULE | Freq: Every day | ORAL | 3 refills | Status: DC

## 2021-01-15 NOTE — Patient Instructions (Addendum)
F/U in   6 to 8 weeks , video, call if you need me before  New for ADHD , adderall .  Please get fasting labs at [facility], lipid, cmp and EGFr, TSH, vit D and magnesium today  Please try to get gyne appointment requested , it is authorized, pt states she has not heard from the office   I will refer you to Dr Merlene Laughter re muscle spasms

## 2021-01-15 NOTE — Progress Notes (Signed)
Virtual Visit via Telephone Note  I connected with [name] on 01/15/21 at  1:20 PM EST by telephone and verified that I am speaking with the correct person using two identifiers.  Location: Patient: home Provider: work   I discussed the limitations, risks, security and privacy concerns of performing an evaluation and management service by telephone and the availability of in person appointments. I also discussed with the patient that there may be a patient responsible charge related to this service. The patient expressed understanding and agreed to proceed.   History of Present Illness:   had removal of stent at [facility] in 09/2020 and this is successful. Back pain has increased and dosing on pain management has been adjusted Increased spasms generalized , neck , back and abdomen increased in past 3 months.Will refer to Neurology C/o poor concentration, , has been present since childhood, worsening , wants medication Upcoming appt with oncology re iron deficiency Observations/Objective: Ht 5' 4.5" (1.638 m)   Wt 232 lb (105.2 kg)   BMI 39.21 kg/m  Good communication with no confusion and intact memory. Alert and oriented x 3 No signs of respiratory distress during speech    Assessment and Plan: Adjustment disorder with anxiety Controlled, no change in medication   Headache syndrome Unchanged, no change in management  Hyperlipidemia LDL goal <100 Hyperlipidemia:Low fat diet discussed and encouraged.   Lipid Panel  Lab Results  Component Value Date   CHOL 146 01/15/2021   HDL 42 01/15/2021   LDLCALC 60 01/15/2021   LDLDIRECT 138 (H) 09/15/2012   TRIG 278 (H) 01/15/2021   CHOLHDL 3.5 01/15/2021  need to reduce fat in diet     Hypothyroidism Controlled, no change in medication   Morbid obesity ([place])  Patient re-educated about  the importance of commitment to a  minimum of 150 minutes of exercise per week as able.  The importance of healthy food  choices with portion control discussed, as well as eating regularly and within a 12 hour window most days. The need to choose "clean , green" food 50 to 75% of the time is discussed, as well as to make water the primary drink and set a goal of 64 ounces water daily.    Weight /BMI 01/15/2021 10/31/2020 10/22/2020  WEIGHT 232 lb 230 lb 231 lb  HEIGHT 5' 4.5" - 5' 4.5"  BMI 39.21 kg/m2 38.87 kg/m2 39.04 kg/m2  Some encounter information is confidential and restricted. Go to Review Flowsheets activity to see all data.      ADHD, predominantly hyperactive type Start adderall XR 10 mg daily  Muscle spasm Reports generalized uncontrolled muscle spasm x approx 2 months,  refer Neuroogy    Follow Up Instructions:    I discussed the assessment and treatment plan with the patient. The patient was provided an opportunity to ask questions and all were answered. The patient agreed with the plan and demonstrated an understanding of the instructions.   The patient was advised to call back or seek an in-person evaluation if the symptoms worsen or if the condition fails to improve as anticipated.  I provided 30 minutes of non-face-to-face time during this encounter.   [name], MD

## 2021-01-16 ENCOUNTER — Encounter: Payer: Self-pay | Admitting: Family Medicine

## 2021-01-16 LAB — CMP14+EGFR
ALT: 24 IU/L (ref 0–32)
AST: 29 IU/L (ref 0–40)
Albumin/Globulin Ratio: 2.3 — ABNORMAL HIGH (ref 1.2–2.2)
Albumin: 4.6 g/dL (ref 3.8–4.9)
Alkaline Phosphatase: 108 IU/L (ref 44–121)
BUN/Creatinine Ratio: 21 (ref 9–23)
BUN: 18 mg/dL (ref 6–24)
Bilirubin Total: 0.4 mg/dL (ref 0.0–1.2)
CO2: 25 mmol/L (ref 20–29)
Calcium: 9.6 mg/dL (ref 8.7–10.2)
Chloride: 100 mmol/L (ref 96–106)
Creatinine, Ser: 0.87 mg/dL (ref 0.57–1.00)
GFR calc Af Amer: 84 mL/min/{1.73_m2} (ref >59)
GFR calc non Af Amer: 73 mL/min/{1.73_m2} (ref >59)
Globulin, Total: 2 g/dL (ref 1.5–4.5)
Glucose: 91 mg/dL (ref 65–99)
Potassium: 4.1 mmol/L (ref 3.5–5.2)
Sodium: 140 mmol/L (ref 134–144)
Total Protein: 6.6 g/dL (ref 6.0–8.5)

## 2021-01-16 LAB — MAGNESIUM: Magnesium: 1.7 mg/dL (ref 1.6–2.3)

## 2021-01-16 LAB — LIPID PANEL
Chol/HDL Ratio: 3.5 ratio (ref 0.0–4.4)
Cholesterol, Total: 146 mg/dL (ref 100–199)
HDL: 42 mg/dL (ref >39)
LDL Chol Calc (NIH): 60 mg/dL (ref 0–99)
Triglycerides: 278 mg/dL — ABNORMAL HIGH (ref 0–149)
VLDL Cholesterol Cal: 44 mg/dL — ABNORMAL HIGH (ref 5–40)

## 2021-01-16 LAB — VITAMIN D 25 HYDROXY (VIT D DEFICIENCY, FRACTURES): Vit D, 25-Hydroxy: 44.2 ng/mL (ref 30.0–100.0)

## 2021-01-16 LAB — TSH: TSH: 2.69 u[IU]/mL (ref 0.450–4.500)

## 2021-01-16 MED ORDER — AMPHETAMINE-DEXTROAMPHET ER 10 MG PO CP24: 10.0000 mg | ORAL_CAPSULE | ORAL | 0 refills | Status: DC

## 2021-01-16 MED ORDER — AMPHETAMINE-DEXTROAMPHET ER 10 MG PO CP24: 10.0000 mg | ORAL_CAPSULE | Freq: Every day | ORAL | 0 refills | Status: DC

## 2021-01-20 ENCOUNTER — Encounter: Payer: Self-pay | Admitting: Family Medicine

## 2021-01-20 DIAGNOSIS — M62838 Other muscle spasm: Secondary | ICD-10-CM | POA: Insufficient documentation

## 2021-01-20 DIAGNOSIS — F901 Attention-deficit hyperactivity disorder, predominantly hyperactive type: Secondary | ICD-10-CM | POA: Insufficient documentation

## 2021-01-20 MED ORDER — ALPRAZOLAM 1 MG PO TABS: ORAL_TABLET | ORAL | 5 refills | Status: DC

## 2021-01-20 NOTE — Assessment & Plan Note (Signed)
Hyperlipidemia:Low fat diet discussed and encouraged.   Lipid Panel  Lab Results  Component Value Date   CHOL 146 01/15/2021   HDL 42 01/15/2021   LDLCALC 60 01/15/2021   LDLDIRECT 138 (H) 09/15/2012   TRIG 278 (H) 01/15/2021   CHOLHDL 3.5 01/15/2021  need to reduce fat in diet

## 2021-01-20 NOTE — Assessment & Plan Note (Signed)
  Patient re-educated about  the importance of commitment to a  minimum of 150 minutes of exercise per week as able.  The importance of healthy food choices with portion control discussed, as well as eating regularly and within a 12 hour window most days. The need to choose "clean , green" food 50 to 75% of the time is discussed, as well as to make water the primary drink and set a goal of 64 ounces water daily.    Weight /BMI 01/15/2021 10/31/2020 10/22/2020  WEIGHT 232 lb 230 lb 231 lb  HEIGHT 5' 4.5" - 5' 4.5"  BMI 39.21 kg/m2 38.87 kg/m2 39.04 kg/m2  Some encounter information is confidential and restricted. Go to Review Flowsheets activity to see all data.

## 2021-01-20 NOTE — Assessment & Plan Note (Signed)
Reports generalized uncontrolled muscle spasm x approx 2 months,  refer Neuroogy

## 2021-01-20 NOTE — Assessment & Plan Note (Signed)
Controlled, no change in medication  

## 2021-01-20 NOTE — Assessment & Plan Note (Signed)
Unchanged, no change in management

## 2021-01-20 NOTE — Assessment & Plan Note (Signed)
Start adderall XR 10 mg daily

## 2021-01-23 NOTE — Telephone Encounter (Signed)
This was scheduled for pt in March with Dr Moshe Cipro

## 2021-01-27 ENCOUNTER — Other Ambulatory Visit: Payer: Self-pay | Admitting: Family Medicine

## 2021-01-27 MED ORDER — ATOMOXETINE HCL 40 MG PO CAPS: 40.0000 mg | ORAL_CAPSULE | Freq: Every day | ORAL | 1 refills | Status: DC

## 2021-01-28 ENCOUNTER — Other Ambulatory Visit: Payer: Self-pay | Admitting: Family Medicine

## 2021-02-08 ENCOUNTER — Other Ambulatory Visit: Payer: Self-pay | Admitting: Family Medicine

## 2021-02-13 ENCOUNTER — Encounter (HOSPITAL_COMMUNITY): Payer: Self-pay | Admitting: Hematology

## 2021-02-13 ENCOUNTER — Inpatient Hospital Stay (HOSPITAL_COMMUNITY): Payer: Medicare Other

## 2021-02-13 ENCOUNTER — Other Ambulatory Visit: Payer: Self-pay

## 2021-02-13 ENCOUNTER — Inpatient Hospital Stay (HOSPITAL_COMMUNITY): Payer: Medicare Other | Attending: Hematology | Admitting: Hematology

## 2021-02-13 VITALS — BP 132/75 | HR 95 | Resp 18 | Ht 64.5 in | Wt 237.4 lb

## 2021-02-13 DIAGNOSIS — Z8349 Family history of other endocrine, nutritional and metabolic diseases: Secondary | ICD-10-CM | POA: Diagnosis not present

## 2021-02-13 DIAGNOSIS — R5383 Other fatigue: Secondary | ICD-10-CM | POA: Diagnosis not present

## 2021-02-13 DIAGNOSIS — Z8249 Family history of ischemic heart disease and other diseases of the circulatory system: Secondary | ICD-10-CM | POA: Diagnosis not present

## 2021-02-13 DIAGNOSIS — E669 Obesity, unspecified: Secondary | ICD-10-CM | POA: Diagnosis not present

## 2021-02-13 DIAGNOSIS — Z800 Family history of malignant neoplasm of digestive organs: Secondary | ICD-10-CM

## 2021-02-13 DIAGNOSIS — Z811 Family history of alcohol abuse and dependence: Secondary | ICD-10-CM | POA: Diagnosis not present

## 2021-02-13 DIAGNOSIS — D509 Iron deficiency anemia, unspecified: Secondary | ICD-10-CM | POA: Diagnosis not present

## 2021-02-13 DIAGNOSIS — Z7901 Long term (current) use of anticoagulants: Secondary | ICD-10-CM | POA: Insufficient documentation

## 2021-02-13 DIAGNOSIS — K219 Gastro-esophageal reflux disease without esophagitis: Secondary | ICD-10-CM | POA: Insufficient documentation

## 2021-02-13 DIAGNOSIS — Z853 Personal history of malignant neoplasm of breast: Secondary | ICD-10-CM

## 2021-02-13 DIAGNOSIS — I2699 Other pulmonary embolism without acute cor pulmonale: Secondary | ICD-10-CM

## 2021-02-13 DIAGNOSIS — R42 Dizziness and giddiness: Secondary | ICD-10-CM | POA: Insufficient documentation

## 2021-02-13 DIAGNOSIS — R079 Chest pain, unspecified: Secondary | ICD-10-CM

## 2021-02-13 DIAGNOSIS — I10 Essential (primary) hypertension: Secondary | ICD-10-CM | POA: Diagnosis not present

## 2021-02-13 DIAGNOSIS — R0602 Shortness of breath: Secondary | ICD-10-CM | POA: Diagnosis not present

## 2021-02-13 DIAGNOSIS — Z803 Family history of malignant neoplasm of breast: Secondary | ICD-10-CM | POA: Diagnosis not present

## 2021-02-13 DIAGNOSIS — Z8041 Family history of malignant neoplasm of ovary: Secondary | ICD-10-CM

## 2021-02-13 DIAGNOSIS — Z808 Family history of malignant neoplasm of other organs or systems: Secondary | ICD-10-CM

## 2021-02-13 DIAGNOSIS — E785 Hyperlipidemia, unspecified: Secondary | ICD-10-CM | POA: Diagnosis not present

## 2021-02-13 DIAGNOSIS — K439 Ventral hernia without obstruction or gangrene: Secondary | ICD-10-CM | POA: Diagnosis not present

## 2021-02-13 DIAGNOSIS — Z79899 Other long term (current) drug therapy: Secondary | ICD-10-CM

## 2021-02-13 DIAGNOSIS — Z833 Family history of diabetes mellitus: Secondary | ICD-10-CM | POA: Diagnosis not present

## 2021-02-13 DIAGNOSIS — M549 Dorsalgia, unspecified: Secondary | ICD-10-CM | POA: Diagnosis not present

## 2021-02-13 LAB — IRON AND TIBC
Iron: 86 ug/dL (ref 28–170)
Saturation Ratios: 24 % (ref 10.4–31.8)
TIBC: 366 ug/dL (ref 250–450)
UIBC: 280 ug/dL

## 2021-02-13 LAB — CBC WITH DIFFERENTIAL/PLATELET
Abs Immature Granulocytes: 0.02 10*3/uL (ref 0.00–0.07)
Basophils Absolute: 0 10*3/uL (ref 0.0–0.1)
Basophils Relative: 1 %
Eosinophils Absolute: 0.1 10*3/uL (ref 0.0–0.5)
Eosinophils Relative: 1 %
HCT: 43.6 % (ref 36.0–46.0)
Hemoglobin: 14.3 g/dL (ref 12.0–15.0)
Immature Granulocytes: 0 %
Lymphocytes Relative: 35 %
Lymphs Abs: 2.3 10*3/uL (ref 0.7–4.0)
MCH: 29.4 pg (ref 26.0–34.0)
MCHC: 32.8 g/dL (ref 30.0–36.0)
MCV: 89.7 fL (ref 80.0–100.0)
Monocytes Absolute: 0.5 10*3/uL (ref 0.1–1.0)
Monocytes Relative: 7 %
Neutro Abs: 3.6 10*3/uL (ref 1.7–7.7)
Neutrophils Relative %: 56 %
Platelets: 196 10*3/uL (ref 150–400)
RBC: 4.86 MIL/uL (ref 3.87–5.11)
RDW: 13.2 % (ref 11.5–15.5)
WBC: 6.5 10*3/uL (ref 4.0–10.5)
nRBC: 0 % (ref 0.0–0.2)

## 2021-02-13 LAB — VITAMIN B12: Vitamin B-12: 219 pg/mL (ref 180–914)

## 2021-02-13 LAB — LACTATE DEHYDROGENASE: LDH: 162 U/L (ref 98–192)

## 2021-02-13 LAB — TSH: TSH: 1.346 u[IU]/mL (ref 0.350–4.500)

## 2021-02-13 LAB — FOLATE: Folate: 14.3 ng/mL (ref >5.9)

## 2021-02-13 LAB — FERRITIN: Ferritin: 44 ng/mL (ref 11–307)

## 2021-02-13 LAB — D-DIMER, QUANTITATIVE: D-Dimer, Quant: <0.27 ug/mL-FEU (ref 0.00–0.50)

## 2021-02-13 NOTE — Progress Notes (Signed)
[name], [place] [address]   CLINIC:  Medical Oncology/Hematology  Patient Care Team: Fayrene Helper, MD as PCP - General Deboraha Sprang, MD as PCP - Cardiology (Cardiology) Selinda Eon (Inactive) as Referring Physician Ray, Debroah Loop, MD (Anesthesiology) Hennie Duos, MD as Consulting Physician (Rheumatology) Ray, Debroah Loop, MD as Referring Physician (Anesthesiology) Lovenia Shuck, MD as Referring Physician (Cardiothoracic Surgery)  CHIEF COMPLAINTS/PURPOSE OF CONSULTATION:  Evaluation of low ferritin and fatigue.  HISTORY OF PRESENTING ILLNESS:  [name] 60 y.o. female is here because of evaluation of severe fatigue from low ferritin, at the request of Dr. [name].  Today she reports feeling okay. She has a history of left breast cancer in 2006 and treated with a mastectomy in [facility]. She then started developing abdominal blockages in 2010 after having a surgery to remove a left ovarian mass but ended up with total hysterectomy and multiple bowel resections. She has been having trouble with her ferritin levels dropping low. She has constant abdominal pain due to not having a mesh and a big hernia, with the pain radiating to the back. She had an unprovoked PE in 1997 and was on Coumadin; she was getting cardiac ablations in 1996, 1997 and 1998. She stopped taking Coumadin and received an IVC filter in 2006 when she was going through breast cancer treatments and was taken out in 2020. She gets mammograms yearly and had genetic testing when she got the mastectomy. She is taking Xarelto 20 mg daily since 2020. She notes that her energy levels have been going down steadily. She has received Feraheme previously and tolerated it well. She denies having weight loss though she is trying to lose weight with her husband.  Multiple maternal aunts had breast cancer (x3); MGF had bone cancer; multiple first cousins with  breast cancer. 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 She is a non-smoker. Her husband is a veteran who was recently diagnosed with prostate cancer.   MEDICAL HISTORY:  Past Medical History:  Diagnosis Date  . Abdominal hernia 08/14/2019  . Abdominal pain 06/06/2019  . Acute bronchitis 05/01/2013  . Acute cystitis 06/09/2010   Qualifier: Diagnosis of  By: Cori Razor LPN, Brandi    . Acute renal insufficiency 08/28/2011  . Anemia    Phreesia 10/29/2020  . Anxiety   . Anxiety state 03/16/2008   Qualifier: Diagnosis of  By: Dierdre Harness    . Arthritis    Phreesia 10/29/2020  . Back pain with radiation 07/17/2014  . Bilateral hand pain 07/27/2016  . Blood transfusion without reported diagnosis    Phreesia 10/29/2020  . Breast cancer ([place])    L breast- 2006  . Cancer ([place])    Phreesia 10/29/2020  . Carpal tunnel syndrome   . Chronic constipation   . Chronic pain syndrome    followed by [facility]---  back  . Clotting disorder ([place])    Phreesia 10/29/2020  . Depression   . Dermatitis 12/15/2011  . Educated about COVID-19 virus infection 05/14/2019  . Fall at home 01/26/2016  . Family history of adverse reaction to anesthesia    MOTHER--- PONV  . Fatigue 09/17/2012  . Fibromyalgia   . FIBROMYALGIA 03/16/2008   Qualifier: Diagnosis of  By: Dierdre Harness    . GERD (gastroesophageal reflux disease)   . Headache disorder 01/16/2013  . Headache syndrome 01/26/2016  . Hip pain, right 07/08/2019  .  History of MRSA infection    lip abscess  . History of ovarian cyst 06/2011   s/p  BSO  . History of pulmonary embolus (PE) 1997   post EP with ablation pulmonary veouns for SVT/ Atrial Fib.  . History of supraventricular tachycardia    s/p  ablation 1996  and 1997  by dr Caryl Comes  . History of TIA (transient ischemic attack) 1997   post op EP ablation PE  . Hyperlipidemia   . Hyperlipidemia LDL goal <100 03/16/2008   Qualifier: Diagnosis of  By: Dierdre Harness    .  Hypothyroidism    followed by pcp  . Incisional hernia   . Insomnia 12/15/2011  . Intermittent palpitations 08/22/2017  . Interstitial cystitis    09-13-2018   per pt last flare-up  May 2019 (followed by pcp)  . INTERSTITIAL CYSTITIS 02/17/2011   Qualifier: Diagnosis of  By: Moshe Cipro MD, Joycelyn Schmid    . Iron deficiency anemia    09-13-2018  PER PT STABLE  . Irregular heart rate 07/25/2013  . Lipoma of back    upper  . Low back pain radiating to right leg 07/04/2019  . Low ferritin level 06/14/2014  . MDD (major depressive disorder), single episode, in full remission ([place]) 10/27/2017  . Medically noncompliant 02/28/2015   Multiple missed appointments, both follow-up appointments and lab appointments.   . Metabolic syndrome X 5/97/4163   Qualifier: Diagnosis of  By: Moshe Cipro MD, Margaret  hBA1c is 5.8 in 02/2013   . Migraines   . Morbid obesity ([place]) 03/27/2013  . Muscle spasm 01/03/2020  . Nausea alone 07/17/2014  . NECK PAIN, CHRONIC 03/16/2008   Qualifier: Diagnosis of  By: Dierdre Harness    . Normal coronary arteries    a. by CT 12/2018.  . Obesity 03/16/2008   Qualifier: Diagnosis of  By: Dierdre Harness    . Oral ulceration 08/23/2014   Presented at 06/14/2014 visit   . Other malaise and fatigue 03/16/2008   Centricity Description: FATIGUE, CHRONIC Qualifier: Diagnosis of  By: Dierdre Harness   Centricity Description: FATIGUE Qualifier: Diagnosis of  By: Christy Sartorius, [place]    . OVARIAN CYST 12/26/2009   Qualifier: Diagnosis of  By: Moshe Cipro MD, Joycelyn Schmid    . Paget disease of breast, left ([place])   . Paget's disease of breast, left ([place]) 03/16/2008   Qualifier: Diagnosis of  By: Dierdre Harness  Left diagnosed in 2006 F/h breast cancer x 15 family members  . Partial small bowel obstruction ([place]) 07/04/2012  . PONV (postoperative nausea and vomiting)    SEVERE  . Postsurgical menopause 11/13/2011  . Presence of IVC filter 06/06/2019  . PSVT (paroxysmal supraventricular tachycardia) ([place])    [place]  . Recurrent oral herpes simplex infection 10/13/2017  . ROM (right otitis media) 01/23/2013  . S/P insertion of IVC (inferior vena caval) filter 05/08/2005   [place] (non-retrievable)  /  dx 2019  a leg of filter is protruding thru the vena cava in to right L2 vertebral body (09-13-2018  per pt having surgery to remove filter in [place])  . S/P radiofrequency ablation operation for arrhythmia 1996   1996  and 1997,   SVT and Atrial Fib  . Sinusitis, chronic 01/26/2016  . SMALL BOWEL OBSTRUCTION, HX OF 08/07/2008   Annotation: obstruction w/ adhesions led to partial colectomy Qualifier: Diagnosis of  By: Craige Cotta    . Stroke [facility])    Phreesia 10/29/2020  . Swelling of  hand 09/17/2012  . Syncope   . Tachycardia 02/03/2010   Qualifier: Diagnosis of  By: Via LPN, Jeani Hawking    . Thyroid disease    Phreesia 10/29/2020  . Ulcer 12/15/2011  . Urinary frequency 07/18/2014  . Vaginitis and vulvovaginitis 05/10/2013  . Vitamin D deficiency 05/05/2015  . Wears glasses     SURGICAL HISTORY: Past Surgical History:  Procedure Laterality Date  . ABDOMINAL HYSTERECTOMY  1987  . ANTERIOR CERVICAL DECOMP/DISCECTOMY FUSION  03-07-2002   dr elsner  _0    C 4 -- 5  . APPENDECTOMY  1980  . AUGMENTATION MAMMAPLASTY Right 2006  . BILATERAL SALPINGOOPHORECTOMY  07/24/2011   via Explor. Lap. w/ intraoperative perf. bowel repair  . BREAST BIOPSY Right 2019   benign  . BREAST ENHANCEMENT SURGERY Bilateral 1993  . BREAST IMPLANT REMOVAL Bilateral   . BREAST SURGERY N/A    Phreesia 10/29/2020  . CARDIAC CATHETERIZATION  07-06-2003   dr Darnell Level brodie   normal coronaries and LVF  . CARDIAC ELECTROPHYSIOLOGY STUDY AND ABLATION  1996  and 1997  . CARDIOVASCULAR STRESS TEST  11-18-2015   dr Caryl Comes   normal nuclear study w/ no ischemia/  normal LV function and wall motion , ef 84%  . CARPAL TUNNEL RELEASE Right ?  . [place]  . CESAREAN SECTION N/A    Phreesia 10/29/2020  .  CHOLECYSTECTOMY N/A    Phreesia 10/29/2020  . COLON SURGERY    . CYSTO/  HYDRODISTENTION/  INSTILATION THERAPY  MULTIPLE  . ENTEROCUTANEOUS FISTULA CLOSURE  multiple   last one 2015 with small bowel resection  . EXPLORATORY LAPAROTOMY INCISIONAL VENTRAL HERNIA REPAIR / RESECTION SMALL BOWEL  11-09-2014   _1   . FRACTURE SURGERY N/A    Phreesia 10/29/2020  . JOINT REPLACEMENT N/A    Phreesia 10/29/2020  . LIPOMA EXCISION Right 09/16/2018   Procedure: EXCISION LIPOMA UPPER BACK;  Surgeon: Clovis Riley, MD;  Location: [facility];  Service: General;  Laterality: Right;  . MASTECTOMY Left 2006   w/ reconstruction on left (paget's disease)  and right breast augmentation  . SMALL INTESTINE SURGERY N/A    Phreesia 10/29/2020  . SPINE SURGERY N/A    Phreesia 10/29/2020  . TOTAL COLECTOMY  08-04-2002    _2    AND CHOLECYSTECTOMY  (colonic inertia)  . TRANSTHORACIC ECHOCARDIOGRAM  02/04/2016   ef 60-65%,  grade 2 diastolic dysfunction/  mild MR  . VENA CAVA FILTER PLACEMENT  05/08/2005   _3    [facility] (non-retrievable)  . WIDE EXCISION PERIRECTAL ABSCESSES  09-22-2005   @ [facility]    SOCIAL HISTORY: Social History   Socioeconomic History  . Marital status: Married    Spouse name: Not on file  . Number of children: Not on file  . Years of education: Not on file  . Highest education level: Not on file  Occupational History  . Not on file  Tobacco Use  . Smoking status: Never Smoker  . Smokeless tobacco: Never Used  Vaping Use  . Vaping Use: Never used  Substance and Sexual Activity  . Alcohol use: No  . Drug use: No  . Sexual activity: Yes    Birth control/protection: Surgical  Other Topics Concern  . Not on file  Social History Narrative  . Not on file   Social Determinants of Health   Financial Resource Strain: Medium Risk  . Difficulty of Paying Living Expenses: Somewhat hard  Food Insecurity: Food Insecurity Present  .  Worried About Paediatric nurse in the Last Year: Sometimes true  . Ran Out of Food in the Last Year: Often true  Transportation Needs: No Transportation Needs  . Lack of Transportation (Medical): No  . Lack of Transportation (Non-Medical): No  Physical Activity: Inactive  . Days of Exercise per Week: 0 days  . Minutes of Exercise per Session: 0 min  Stress: Stress Concern Present  . Feeling of Stress : Rather much  Social Connections: Moderately Integrated  . Frequency of Communication with Friends and Family: More than three times a week  . Frequency of Social Gatherings with Friends and Family: Once a week  . Attends Religious Services: Never  . Active Member of Clubs or Organizations: Yes  . Attends Archivist Meetings: Never  . Marital Status: Married  Human resources officer Violence: Not At Risk  . Fear of Current or Ex-Partner: No  . Emotionally Abused: No  . Physically Abused: No  . Sexually Abused: No    FAMILY HISTORY: Family History  Problem Relation Age of Onset  . Diabetes Mother   . Heart disease Mother   . Hypertension Mother   . Heart disease Father   . Hyperlipidemia Father   . Hypertension Father   . Alcohol abuse Father   . Colon cancer Maternal Aunt   . Breast cancer Maternal Aunt   . Cancer Maternal Uncle        mets  . Bone cancer Maternal Grandfather        mets  . Ovarian cancer Cousin 14  . Breast cancer Cousin   . Prostate cancer Maternal Uncle   . Breast cancer Maternal Aunt   . Brain cancer Maternal Aunt     ALLERGIES:  is allergic to nitrofurantoin, crestor [rosuvastatin calcium], bisacodyl, clarithromycin, clarithromycin, clindamycin hcl, codeine, monosodium glutamate, scopolamine hbr, and other.  MEDICATIONS:  Current Outpatient Medications  Medication Sig Dispense Refill  . ALPRAZolam (XANAX) 1 MG tablet Take 1 tablet (1 mg total) by mouth 2 (two) times daily. 60 tablet 2  . ALPRAZolam (XANAX) 1 MG tablet Take 1 tablet (1 mg total) by mouth 2 (two)  times daily. 60 tablet 0  . ALPRAZolam (XANAX) 1 MG tablet Take one tablet by mouth two times daily for anxiety 60 tablet 5  . aspirin 81 MG chewable tablet Chew 81 mg by mouth daily.    . butalbital-acetaminophen-caffeine (FIORICET) 50-325-40 MG tablet Take 1 tablet by mouth as directed.    . conjugated estrogens (PREMARIN) vaginal cream Place fingertip amount intravaginally 2 to 3 times per week 42.5 g 1  . conjugated estrogens (PREMARIN) vaginal cream Place 1 Applicatorful vaginally at bedtime. Apply sparingly fingertip at bedtime 42.5 g 1  . cyanocobalamin (,VITAMIN B-12,) 1000 MCG/ML injection INJECT 1 ML INTO THE MUSCLE EVERY 30 DAYS.  0  . cyclobenzaprine (FLEXERIL) 10 MG tablet TAKE 1 TABLET BY MOUTH TWICE DAILY AS NEEDED FOR MUSCLE SPASMS 180 tablet 0  . cycloSPORINE (RESTASIS) 0.05 % ophthalmic emulsion Place 1 drop into both eyes 2 (two) times daily.    . DULoxetine (CYMBALTA) 60 MG capsule Take 60 mg by mouth daily. Patient is taking 30 mg weaning off 30 capsule 5  . ezetimibe (ZETIA) 10 MG tablet Take 1 tablet (10 mg total) by mouth daily. 90 tablet 3  . HYDROmorphone (DILAUDID) 4 MG tablet Dilaudid 4 mg tablet  Take 1 tablet 3 times a day by oral route as needed.    Marland Kitchen levothyroxine (  SYNTHROID) 125 MCG tablet TAKE 1 TABLET(125 MCG) BY MOUTH DAILY BEFORE BREAKFAST 90 tablet 1  . MAGNESIUM-OXIDE 400 (241.3 Mg) MG tablet TAKE 1 TABLET BY MOUTH 2 TIMES A DAY 180 tablet 2  . Multiple Vitamins-Minerals (ICAPS AREDS 2) CAPS Take by mouth at bedtime.    Marland Kitchen omeprazole (PRILOSEC) 40 MG capsule Take 1 capsule (40 mg total) by mouth daily. 180 capsule 3  . pravastatin (PRAVACHOL) 40 MG tablet TAKE 1 TABLET(40 MG) BY MOUTH DAILY 90 tablet 3  . senna-docusate (SENOKOT-S) 8.6-50 MG per tablet Take 2 tablets by mouth 3 (three) times daily. constipation    . Vitamin D, Ergocalciferol, (DRISDOL) 50000 units CAPS capsule Take 50,000 Units by mouth every 7 (seven) days.     Alveda Reasons 20 MG TABS tablet  Take 1 tablet (20 mg total) by mouth daily. 30 tablet 5  . amphetamine-dextroamphetamine (ADDERALL XR) 10 MG 24 hr capsule Take 1 capsule (10 mg total) by mouth every morning. (Patient not taking: Reported on 02/13/2021) 30 capsule 0  . atomoxetine (STRATTERA) 40 MG capsule Take 1 capsule (40 mg total) by mouth daily. (Patient not taking: Reported on 02/13/2021) 30 capsule 1  . fluticasone (FLONASE) 50 MCG/ACT nasal spray Place 2 sprays into both nostrils daily. (Patient not taking: Reported on 02/13/2021) 16 g 6  . Glycerin, Laxative, 80.7 % SUPP Place 1 suppository rectally at bedtime as needed (for constipation).  (Patient not taking: Reported on 02/13/2021)    . meclizine (ANTIVERT) 25 MG tablet Take 1 tablet (25 mg total) by mouth 3 (three) times daily as needed for dizziness. (Patient not taking: Reported on 02/13/2021) 30 tablet 0  . metoprolol tartrate (LOPRESSOR) 25 MG tablet Take 1 tablet (25 mg total) by mouth 2 (two) times daily. (Patient not taking: Reported on 02/13/2021) 180 tablet 3   No current facility-administered medications for this visit.    REVIEW OF SYSTEMS:   Review of Systems  Constitutional: Positive for fatigue (25%). Negative for appetite change and unexpected weight change.  Respiratory: Positive for shortness of breath and wheezing.   Cardiovascular: Positive for chest pain.  Gastrointestinal: Positive for abdominal pain (6/10 d/t hernia) and diarrhea.  Musculoskeletal: Positive for back pain (d/t hernia & multiple surgeries).  Neurological: Positive for dizziness and headaches.  Psychiatric/Behavioral: The patient is nervous/anxious.   All other systems reviewed and are negative.    PHYSICAL EXAMINATION: ECOG PERFORMANCE STATUS: 1 - Symptomatic but completely ambulatory  Vitals:   02/13/21 1407  BP: 132/75  Pulse: 95  Resp: 18  SpO2: 97%   Filed Weights   02/13/21 1407  Weight: 237 lb 6.4 oz (107.7 kg)   Physical Exam Vitals reviewed.  Constitutional:       Appearance: Normal appearance. She is obese.  Cardiovascular:     Rate and Rhythm: Normal rate and regular rhythm.     Pulses: Normal pulses.     Heart sounds: Normal heart sounds.  Pulmonary:     Effort: Pulmonary effort is normal.     Breath sounds: Normal breath sounds.  Abdominal:     Palpations: Abdomen is soft. There is no mass.     Tenderness: There is no abdominal tenderness.     Hernia: A hernia is present. Hernia is present in the ventral area (post-surgical hernia).  Neurological:     General: No focal deficit present.     Mental Status: She is alert and oriented to person, place, and time.  Psychiatric:  Mood and Affect: Mood normal.        Behavior: Behavior normal.      LABORATORY DATA:  I have reviewed the data as listed Recent Results (from the past 2160 hour(s))  Lipid panel     Status: Abnormal   Collection Time: 01/15/21  3:40 PM  Result Value Ref Range   Cholesterol, Total 146 100 - 199 mg/dL   Triglycerides 278 (H) 0 - 149 mg/dL   HDL 42 >39 mg/dL   VLDL Cholesterol Cal 44 (H) 5 - 40 mg/dL   LDL Chol Calc (NIH) 60 0 - 99 mg/dL   Chol/HDL Ratio 3.5 0.0 - 4.4 ratio    Comment:                                   T. Chol/HDL Ratio                                             Men  Women                               1/2 Avg.Risk  3.4    3.3                                   Avg.Risk  5.0    4.4                                2X Avg.Risk  9.6    7.1                                3X Avg.Risk 23.4   11.0   CMP14+EGFR     Status: Abnormal   Collection Time: 01/15/21  3:40 PM  Result Value Ref Range   Glucose 91 65 - 99 mg/dL   BUN 18 6 - 24 mg/dL   Creatinine, Ser 0.87 0.57 - 1.00 mg/dL   GFR calc non Af Amer 73 >59 mL/min/1.73   GFR calc Af Amer 84 >59 mL/min/1.73    Comment: **In accordance with recommendations from the NKF-ASN Task force,**   [facility] is in the process of updating its eGFR calculation to the   2021 CKD-EPI creatinine  equation that estimates kidney function   without a race variable.    BUN/Creatinine Ratio 21 9 - 23   Sodium 140 134 - 144 mmol/L   Potassium 4.1 3.5 - 5.2 mmol/L   Chloride 100 96 - 106 mmol/L   CO2 25 20 - 29 mmol/L   Calcium 9.6 8.7 - 10.2 mg/dL   Total Protein 6.6 6.0 - 8.5 g/dL   Albumin 4.6 3.8 - 4.9 g/dL   Globulin, Total 2.0 1.5 - 4.5 g/dL   Albumin/Globulin Ratio 2.3 (H) 1.2 - 2.2   Bilirubin Total 0.4 0.0 - 1.2 mg/dL   Alkaline Phosphatase 108 44 - 121 IU/L   AST 29 0 - 40 IU/L   ALT 24 0 - 32 IU/L  TSH     Status: None   Collection Time: 01/15/21  3:40  PM  Result Value Ref Range   TSH 2.690 0.450 - 4.500 uIU/mL  VITAMIN D 25 Hydroxy (Vit-D Deficiency, Fractures)     Status: None   Collection Time: 01/15/21  3:40 PM  Result Value Ref Range   Vit D, 25-Hydroxy 44.2 30.0 - 100.0 ng/mL    Comment: Vitamin D deficiency has been defined by the [place] practice guideline as a level of serum 25-OH vitamin D less than 20 ng/mL (1,2). The Endocrine Society went on to further define vitamin D insufficiency as a level between 21 and 29 ng/mL (2). 1. IOM (Institute of Medicine). 2010. Dietary reference    intakes for calcium and D. [place]: The    [employer]. 2. Holick MF, Binkley Talala, Bischoff-Ferrari HA, et al.    Evaluation, treatment, and prevention of vitamin D    deficiency: an Endocrine Society clinical practice    guideline. JCEM. 2011 Jul; 96(7):1911-30.   Magnesium     Status: None   Collection Time: 01/15/21  3:40 PM  Result Value Ref Range   Magnesium 1.7 1.6 - 2.3 mg/dL    RADIOGRAPHIC STUDIES: I have personally reviewed the radiological images as listed and agreed with the findings in the report. No results found.  ASSESSMENT:  1.  Iron deficiency state due to multiple bowel resections: -She has malabsorption secondary to extensive GI surgery. -She has received parenteral iron therapy with  Feraheme in the past.  2.  Paget's disease of the left breast: -Diagnosed in 2006, treated with mastectomy and lymph node removal and TRAM flap reconstruction at [facility]. -She did not receive any adjuvant therapy (XRT, chemo or hormonal). -Mammogram on 04/08/2020 was BI-RADS Category 1.  3.  Unprovoked pulmonary embolism: -Originally diagnosed in 1997 and was treated with Coumadin. -Reportedly had IVC filter placed in 2006 when she had bleeding at the time of breast surgery and Coumadin discontinued. -Had IVC filter removed at [facility] around September 2021.  She was started on Xarelto at that time.  4.  Social/family history: -She worked as a Engineer, production and currently on disability.  Never smoker. -3 maternal aunts had breast cancer.  Maternal grandfather had bone cancer.  2 maternal uncles had prostate cancer and bone cancer respectively.  Approximately 15 first cousins had breast cancer.  5.  The ventral hernia: -Related to previous abdominal surgery.  She wants to have it repaired later on.    PLAN:  1.  Iron deficiency anemia due to multiple bowel resections: -We will check for various nutritional deficiencies including G92, folic acid, methylmalonic acid, copper levels. -Ferritin today is 44.  She complains of severe fatigue. -Recommend 2 infusions of Venofer.  We will schedule a phone visit to discuss results and further follow-up.  2.  Family history: -She reportedly had genetic testing done in 2006. -I have made a referral to genetics to see if she qualifies for repeat testing.  3.  Unprovoked pulmonary embolism: -Continue Xarelto.    All questions were answered. The patient knows to call the clinic with any problems, questions or concerns.   Derek Jack, MD 02/13/21 2:40 PM  [place] [phone]   I, Milinda Antis, am acting as a scribe for Dr. Sanda Linger.  I, Derek Jack MD, have reviewed  the above documentation for accuracy and completeness, and I agree with the above.

## 2021-02-13 NOTE — Patient Instructions (Signed)
[place] at [facility] Discharge Instructions  You were seen and examined today by Dr. Delton Coombes. Dr. Delton Coombes is a hematologist, meaning he specializes in blood disorders. Dr. Delton Coombes discussed your past medical history, family history of cancer/blood disorders and the events that lead to you being here today.  Dr. Delton Coombes has recommended lab work today to check your blood counts and iron levels as well as additional blood work to see if there is an identifiable cause for decreased energy levels or anything that would cause decreased iron.  Follow-up as scheduled.   Thank you for choosing [place] at [facility] to provide your oncology and hematology care.  To afford each patient quality time with our provider, please arrive at least 15 minutes before your scheduled appointment time.   If you have a lab appointment with the [facility] please come in thru the Main Entrance and check in at the main information desk.  You need to re-schedule your appointment should you arrive 10 or more minutes late.  We strive to give you quality time with our providers, and arriving late affects you and other patients whose appointments are after yours.  Also, if you no show three or more times for appointments you may be dismissed from the clinic at the providers discretion.     Again, thank you for choosing [facility].  Our hope is that these requests will decrease the amount of time that you wait before being seen by our physicians.       _____________________________________________________________  Should you have questions after your visit to [facility], please contact our office at [phone] and follow the prompts.  Our office hours are 8:00 a.m. and 4:30 p.m. Monday - Friday.  Please note that voicemails left after 4:00 p.m. may not be returned until the following business day.  We are closed weekends and major  holidays.  You do have access to a nurse 24-7, just call the main number to the clinic [phone] and do not press any options, hold on the line and a nurse will answer the phone.    For prescription refill requests, have your pharmacy contact our office and allow 72 hours.    Due to Covid, you will need to wear a mask upon entering the hospital. If you do not have a mask, a mask will be given to you at the Main Entrance upon arrival. For doctor visits, patients may have 1 support person age 53 or older with them. For treatment visits, patients can not have anyone with them due to social distancing guidelines and our immunocompromised population.

## 2021-02-14 LAB — CANCER ANTIGEN 15-3: CA 15-3: 9.6 U/mL (ref 0.0–25.0)

## 2021-02-16 LAB — COPPER, SERUM: Copper: 134 ug/dL (ref 80–158)

## 2021-02-17 ENCOUNTER — Other Ambulatory Visit: Payer: Self-pay | Admitting: Family Medicine

## 2021-02-17 ENCOUNTER — Telehealth: Payer: Self-pay

## 2021-02-17 LAB — METHYLMALONIC ACID, SERUM: Methylmalonic Acid, Quantitative: 136 nmol/L (ref 0–378)

## 2021-02-17 NOTE — Telephone Encounter (Signed)
Pt LVM that she still has not received her levothyroxine medication

## 2021-02-18 ENCOUNTER — Ambulatory Visit (HOSPITAL_COMMUNITY): Payer: Medicare Other

## 2021-02-18 ENCOUNTER — Other Ambulatory Visit: Payer: Self-pay

## 2021-02-18 ENCOUNTER — Inpatient Hospital Stay (HOSPITAL_COMMUNITY): Payer: Medicare Other

## 2021-02-18 ENCOUNTER — Encounter (HOSPITAL_COMMUNITY): Payer: Self-pay

## 2021-02-18 VITALS — BP 107/68 | HR 86 | Temp 97.2°F | Resp 18

## 2021-02-18 DIAGNOSIS — D509 Iron deficiency anemia, unspecified: Secondary | ICD-10-CM | POA: Diagnosis not present

## 2021-02-18 DIAGNOSIS — E038 Other specified hypothyroidism: Secondary | ICD-10-CM

## 2021-02-18 DIAGNOSIS — D500 Iron deficiency anemia secondary to blood loss (chronic): Secondary | ICD-10-CM

## 2021-02-18 LAB — PROTEIN ELECTROPHORESIS, SERUM
A/G Ratio: 1.4 (ref 0.7–1.7)
Albumin ELP: 3.8 g/dL (ref 2.9–4.4)
Alpha-1-Globulin: 0.2 g/dL (ref 0.0–0.4)
Alpha-2-Globulin: 0.8 g/dL (ref 0.4–1.0)
Beta Globulin: 1 g/dL (ref 0.7–1.3)
Gamma Globulin: 0.7 g/dL (ref 0.4–1.8)
Globulin, Total: 2.8 g/dL (ref 2.2–3.9)
Total Protein ELP: 6.6 g/dL (ref 6.0–8.5)

## 2021-02-18 MED ORDER — METHYLPREDNISOLONE SODIUM SUCC 125 MG IJ SOLR
125.0000 mg | Freq: Once | INTRAMUSCULAR | Status: AC | PRN
  Administered 2021-02-18: 125 mg via INTRAVENOUS

## 2021-02-18 MED ORDER — FAMOTIDINE 20 MG PO TABS
20.0000 mg | ORAL_TABLET | Freq: Once | ORAL | Status: AC
  Administered 2021-02-18: 20 mg via ORAL
  Filled 2021-02-18: qty 1

## 2021-02-18 MED ORDER — LORATADINE 10 MG PO TABS
10.0000 mg | ORAL_TABLET | Freq: Once | ORAL | Status: AC
Start: 2021-02-18 — End: 2021-02-18
  Administered 2021-02-18: 10 mg via ORAL
  Filled 2021-02-18: qty 1

## 2021-02-18 MED ORDER — LEVOTHYROXINE SODIUM 125 MCG PO TABS: ORAL_TABLET | ORAL | 1 refills | Status: DC

## 2021-02-18 MED ORDER — SODIUM CHLORIDE 0.9 % IV SOLN: Freq: Once | INTRAVENOUS | Status: AC

## 2021-02-18 MED ORDER — SODIUM CHLORIDE 0.9 % IV SOLN
300.0000 mg | Freq: Once | INTRAVENOUS | Status: AC
  Administered 2021-02-18: 300 mg via INTRAVENOUS
  Filled 2021-02-18: qty 15

## 2021-02-18 MED ORDER — ACETAMINOPHEN 325 MG PO TABS
650.0000 mg | ORAL_TABLET | Freq: Once | ORAL | Status: AC
  Administered 2021-02-18: 650 mg via ORAL
  Filled 2021-02-18: qty 2

## 2021-02-18 NOTE — Progress Notes (Signed)
Patient presents today for Venofer infusion. MAR reviewed and updated. Vital signs stable. Patient has abdominal pain she rates a 7/10 today. Chronic pain due to a hernia per patient's words. Patient denies any significant changes since her last visit.    14:05 pm. Upon entering room to check on patient, patient states, " I feel hot like I have a sunburn." Upon assessment patient's face is flushed bright red, arms bilateral bright red, diaphoretic. Venofer stopped. Normal Saline bolus initiated at this time.   14:05 pm 114/67, 88, 20, 96%.   14:15 pm Dr. Delton Coombes at the bedside. Verbal order received to give Solumedrol 125mg  x 1 dose now. Wait 10 minutes and restart Venofer infusion.   14:20 pm Solumedrol 125mg  given IV .   14:31 pm Venofer infusion restarted.   14:54 pm Patient has no complaints of any flushing, sweating, or redness.   Treatment given today per MD orders. Tolerated infusion without adverse affects. Vital signs stable. No complaints at this time. Discharged from clinic ambulatory in stable condition. Alert and oriented x 3. F/U with [facility] as scheduled.

## 2021-02-18 NOTE — Telephone Encounter (Signed)
Rx sent in this morning.

## 2021-02-18 NOTE — Patient Instructions (Signed)
[facility]  Discharge Instructions:   _______________________________________________________________  Thank you for choosing [facility] to provide your oncology and hematology care.  To afford each patient quality time with our providers, please arrive at least 15 minutes before your scheduled appointment.  You need to re-schedule your appointment if you arrive 10 or more minutes late.  We strive to give you quality time with our providers, and arriving late affects you and other patients whose appointments are after yours.  Also, if you no show three or more times for appointments you may be dismissed from the clinic.  Again, thank you for choosing [facility]. Our hope is that these requests will allow you access to exceptional care and in a timely manner. _______________________________________________________________  If you have questions after your visit, please contact our office at [phone] between the hours of 8:30 a.m. and 5:00 p.m. Voicemails left after 4:30 p.m. will not be returned until the following business day. _______________________________________________________________  For prescription refill requests, have your pharmacy contact our office. _______________________________________________________________  Recommendations made by the consultant and any test results will be sent to your referring physician. _______________________________________________________________ 

## 2021-02-20 ENCOUNTER — Other Ambulatory Visit: Payer: Self-pay | Admitting: Nurse Practitioner

## 2021-02-20 MED ORDER — ALPRAZOLAM 1 MG PO TABS: 1.0000 mg | ORAL_TABLET | Freq: Two times a day (BID) | ORAL | 2 refills | Status: DC

## 2021-02-20 NOTE — Telephone Encounter (Signed)
sent 

## 2021-02-21 NOTE — Addendum Note (Signed)
Addended by: Henreitta Leber E on: 02/21/2021 12:12 PM   Modules accepted: Orders

## 2021-02-24 ENCOUNTER — Other Ambulatory Visit: Payer: Self-pay

## 2021-02-24 ENCOUNTER — Telehealth: Payer: Self-pay

## 2021-02-24 DIAGNOSIS — Z01419 Encounter for gynecological examination (general) (routine) without abnormal findings: Secondary | ICD-10-CM

## 2021-02-24 DIAGNOSIS — N898 Other specified noninflammatory disorders of vagina: Secondary | ICD-10-CM

## 2021-02-24 NOTE — Telephone Encounter (Signed)
Sent message that the pharmacy confirmed they received med on 1/20 and since it wasn't picked up they put it on hold so she just needs to tell them she is ready for it to be filled.

## 2021-02-24 NOTE — Telephone Encounter (Signed)
Pt called just wanting to let you know she has weaned herself off of her Cymbalta and will start the Adderall tomorrow. FYI

## 2021-02-24 NOTE — Telephone Encounter (Signed)
Patient called need med refill:  amphetamine-dextroamphetamine (ADDERALL XR) 10 MG  Pharmacy:  [facility]

## 2021-02-25 ENCOUNTER — Ambulatory Visit (HOSPITAL_COMMUNITY): Payer: Medicare Other

## 2021-03-04 ENCOUNTER — Inpatient Hospital Stay (HOSPITAL_COMMUNITY): Payer: Medicare Other | Attending: Hematology | Admitting: Hematology

## 2021-03-04 ENCOUNTER — Other Ambulatory Visit: Payer: Self-pay

## 2021-03-04 DIAGNOSIS — D500 Iron deficiency anemia secondary to blood loss (chronic): Secondary | ICD-10-CM

## 2021-03-04 NOTE — Progress Notes (Signed)
Virtual Visit via Telephone Note  I connected with [name] on 03/04/21 at  4:00 PM EST by telephone and verified that I am speaking with the correct person using two identifiers.  Location: Patient: At home Provider: In the office   I discussed the limitations, risks, security and privacy concerns of performing an evaluation and management service by telephone and the availability of in person appointments. I also discussed with the patient that there may be a patient responsible charge related to this service. The patient expressed understanding and agreed to proceed.   History of Present Illness: She was seen previously in our office for iron deficiency state due to multiple bowel resections and malabsorption.  She also had a history of Paget's disease of the left breast which was resected with mastectomy in 2006.  She also had unprovoked pulmonary embolism in 1997 and is on Xarelto.   Observations/Objective: She reported bleeding per rectum on and off for the last 2 weeks from hemorrhoids.  She also has baseline diarrhea which is stable.  Assessment and Plan:  1.  Iron deficiency anemia: -She received Venofer 300 mg on 02/18/2021.  While receiving Venofer she felt flushed.  We had to stop the infusion and give her steroids and Benadryl.  She finished her Venofer infusion but felt the same way when she went home that evening. -Hence we discontinued any future Venofer. -We have gotten approval for INFeD which is preferred by her insurance.  She is scheduled for 1 g infusion in the next few days.  We will premedicate her prior to infusion. -We reviewed labs from 02/13/2021 which showed normal LDH.  Folic acid and C16, copper and methylmalonic acid were normal.  SPEP was negative.  CA 15-3 and TSH were also normal. -She has a follow-up appointment with Dr. Jearld Adjutant on 04/17/2021. -I plan to see her back in 3 months with repeat labs.  2.  Unprovoked pulmonary embolism: -D-dimer is  negative.  Continue Xarelto.   Follow Up Instructions:   RTC 3 months with repeat labs. I discussed the assessment and treatment plan with the patient. The patient was provided an opportunity to ask questions and all were answered. The patient agreed with the plan and demonstrated an understanding of the instructions.   The patient was advised to call back or seek an in-person evaluation if the symptoms worsen or if the condition fails to improve as anticipated.  I provided 12 minutes of non-face-to-face time during this encounter.   Derek Jack, MD

## 2021-03-06 ENCOUNTER — Telehealth (HOSPITAL_COMMUNITY): Payer: Medicare Other | Admitting: Genetic Counselor

## 2021-03-11 ENCOUNTER — Inpatient Hospital Stay (HOSPITAL_COMMUNITY): Payer: Medicare Other

## 2021-03-12 ENCOUNTER — Telehealth: Payer: Medicare Other | Admitting: Family Medicine

## 2021-03-19 ENCOUNTER — Telehealth: Payer: Medicare Other | Admitting: Family Medicine

## 2021-03-20 ENCOUNTER — Ambulatory Visit (HOSPITAL_COMMUNITY): Payer: Medicare Other

## 2021-03-24 ENCOUNTER — Telehealth: Payer: Medicare Other | Admitting: Family Medicine

## 2021-03-28 ENCOUNTER — Encounter (HOSPITAL_COMMUNITY): Payer: Self-pay

## 2021-03-28 ENCOUNTER — Other Ambulatory Visit: Payer: Self-pay

## 2021-03-28 ENCOUNTER — Inpatient Hospital Stay (HOSPITAL_COMMUNITY): Payer: Medicare Other | Attending: Hematology

## 2021-03-28 VITALS — BP 125/55 | HR 82 | Temp 97.0°F | Resp 18

## 2021-03-28 DIAGNOSIS — R790 Abnormal level of blood mineral: Secondary | ICD-10-CM | POA: Diagnosis not present

## 2021-03-28 DIAGNOSIS — D509 Iron deficiency anemia, unspecified: Secondary | ICD-10-CM | POA: Diagnosis not present

## 2021-03-28 DIAGNOSIS — D500 Iron deficiency anemia secondary to blood loss (chronic): Secondary | ICD-10-CM

## 2021-03-28 MED ORDER — SODIUM CHLORIDE 0.9 % IV SOLN: Freq: Once | INTRAVENOUS | Status: AC

## 2021-03-28 MED ORDER — FAMOTIDINE IN NACL 20-0.9 MG/50ML-% IV SOLN
INTRAVENOUS | Status: AC
  Filled 2021-03-28: qty 50

## 2021-03-28 MED ORDER — SODIUM CHLORIDE 0.9 % IV SOLN
50.0000 mg | Freq: Once | INTRAVENOUS | Status: AC
  Administered 2021-03-28: 50 mg via INTRAVENOUS
  Filled 2021-03-28: qty 1

## 2021-03-28 MED ORDER — SODIUM CHLORIDE 0.9 % IV SOLN
950.0000 mg | Freq: Once | INTRAVENOUS | Status: AC
  Administered 2021-03-28: 950 mg via INTRAVENOUS
  Filled 2021-03-28: qty 19

## 2021-03-28 MED ORDER — FAMOTIDINE IN NACL 20-0.9 MG/50ML-% IV SOLN
20.0000 mg | Freq: Once | INTRAVENOUS | Status: AC
  Administered 2021-03-28: 20 mg via INTRAVENOUS

## 2021-03-28 MED ORDER — METHYLPREDNISOLONE SODIUM SUCC 125 MG IJ SOLR
INTRAMUSCULAR | Status: AC
  Filled 2021-03-28: qty 2

## 2021-03-28 MED ORDER — DIPHENHYDRAMINE HCL 25 MG PO CAPS
ORAL_CAPSULE | ORAL | Status: AC
  Filled 2021-03-28: qty 2

## 2021-03-28 MED ORDER — DIPHENHYDRAMINE HCL 25 MG PO CAPS
50.0000 mg | ORAL_CAPSULE | Freq: Once | ORAL | Status: AC
  Administered 2021-03-28: 50 mg via ORAL

## 2021-03-28 MED ORDER — ACETAMINOPHEN 325 MG PO TABS
650.0000 mg | ORAL_TABLET | Freq: Once | ORAL | Status: AC
  Administered 2021-03-28: 650 mg via ORAL

## 2021-03-28 MED ORDER — ACETAMINOPHEN 325 MG PO TABS
ORAL_TABLET | ORAL | Status: AC
  Filled 2021-03-28: qty 2

## 2021-03-28 MED ORDER — METHYLPREDNISOLONE SODIUM SUCC 125 MG IJ SOLR
125.0000 mg | Freq: Once | INTRAMUSCULAR | Status: AC
  Administered 2021-03-28: 125 mg via INTRAVENOUS

## 2021-03-28 NOTE — Progress Notes (Signed)
Patient presents today for INFED iron infusion. Vital signs stable. Patient has no complaints of any significant changes today. MAR reviewed and updated. Patient given pre-medications prior to test dose of INFED . SEE MAR.   09:51 am 50 mg dose of INFED administered.   10:01 am patient denies any flushing or warm feeling at insertion site like her last reaction to iron per patient's words.   INFED given today per MD orders. Tolerated infusion without adverse affects. Vital signs stable. No complaints at this time. Discharged from clinic ambulatory in stable condition. Alert and oriented x 3. F/U with [facility] as scheduled.

## 2021-03-28 NOTE — Patient Instructions (Signed)
[facility]  Discharge Instructions:   _______________________________________________________________  Thank you for choosing  [facility] to provide your oncology and hematology care.  To afford each patient quality time with our providers, please arrive at least 15 minutes before your scheduled appointment.  You need to re-schedule your appointment if you arrive 10 or more minutes late.  We strive to give you quality time with our providers, and arriving late affects you and other patients whose appointments are after yours.  Also, if you no show three or more times for appointments you may be dismissed from the clinic.  Again, thank you for choosing  [facility]. Our hope is that these requests will allow you access to exceptional care and in a timely manner. _______________________________________________________________  If you have questions after your visit, please contact our office at [phone] between the hours of 8:30 a.m. and 5:00 p.m. Voicemails left after 4:30 p.m. will not be returned until the following business day. _______________________________________________________________  For prescription refill requests, have your pharmacy contact our office. _______________________________________________________________  Recommendations made by the consultant and any test results will be sent to your referring physician. _______________________________________________________________ 

## 2021-04-03 ENCOUNTER — Other Ambulatory Visit: Payer: Self-pay | Admitting: Family Medicine

## 2021-04-03 DIAGNOSIS — Z1231 Encounter for screening mammogram for malignant neoplasm of breast: Secondary | ICD-10-CM

## 2021-04-07 ENCOUNTER — Other Ambulatory Visit: Payer: Self-pay

## 2021-04-07 ENCOUNTER — Telehealth (INDEPENDENT_AMBULATORY_CARE_PROVIDER_SITE_OTHER): Payer: Medicare Other | Admitting: Family Medicine

## 2021-04-07 ENCOUNTER — Encounter: Payer: Self-pay | Admitting: Family Medicine

## 2021-04-07 VITALS — BP 110/61 | Ht 64.5 in | Wt 239.0 lb

## 2021-04-07 DIAGNOSIS — E785 Hyperlipidemia, unspecified: Secondary | ICD-10-CM | POA: Diagnosis not present

## 2021-04-07 DIAGNOSIS — F901 Attention-deficit hyperactivity disorder, predominantly hyperactive type: Secondary | ICD-10-CM

## 2021-04-07 DIAGNOSIS — M48061 Spinal stenosis, lumbar region without neurogenic claudication: Secondary | ICD-10-CM

## 2021-04-07 DIAGNOSIS — I10 Essential (primary) hypertension: Secondary | ICD-10-CM

## 2021-04-07 DIAGNOSIS — E038 Other specified hypothyroidism: Secondary | ICD-10-CM | POA: Diagnosis not present

## 2021-04-07 DIAGNOSIS — M62838 Other muscle spasm: Secondary | ICD-10-CM

## 2021-04-07 DIAGNOSIS — M5416 Radiculopathy, lumbar region: Secondary | ICD-10-CM

## 2021-04-07 DIAGNOSIS — F4322 Adjustment disorder with anxiety: Secondary | ICD-10-CM

## 2021-04-07 NOTE — Patient Instructions (Addendum)
F/U in office with MD mid September, call if you need me  Sooner, flu vaccine at  Visit  You are being referred to Dr Merlene Laughter re new spasm in past 6 months  Please get shingles vaccines through your pharmacy  Faqsting lipid, cmp and EGFR, tSH early september\ It is important that you exercise regularly at least 30 minutes 5 times a week. If you develop chest pain, have severe difficulty breathing, or feel very tired, stop exercising immediately and seek medical attention   Think about what you will eat, plan ahead. Choose " clean, green, fresh or frozen" over canned, processed or packaged foods which are more sugary, salty and fatty. 70 to 75% of food eaten should be vegetables and fruit. Three meals at set times with snacks allowed between meals, but they must be fruit or vegetables. Aim to eat over a 12 hour period , example 7 am to 7 pm, and STOP after  your last meal of the day. Drink water,generally about 64 ounces per day, no other drink is as healthy. Fruit juice is best enjoyed in a healthy way, by EATING the fruit.   Thanks for choosing [facility], we consider it a privelige to serve you.

## 2021-04-13 ENCOUNTER — Encounter: Payer: Self-pay | Admitting: Family Medicine

## 2021-04-13 NOTE — Assessment & Plan Note (Signed)
Controlled, no change in medication DASH diet and commitment to daily physical activity for a minimum of 30 minutes discussed and encouraged, as a part of hypertension management. The importance of attaining a healthy weight is also discussed.  BP/Weight 04/07/2021 03/28/2021 02/18/2021 02/13/2021 01/15/2021 10/31/2020 67/67/2094  Systolic BP 709 628 366 294 - - 765  Diastolic BP 61 55 68 75 - - 84  Wt. (Lbs) 239 - - 237.4 232 230 231  BMI 40.39 - - 40.12 39.21 38.87 39.04  Some encounter information is confidential and restricted. Go to Review Flowsheets activity to see all data.

## 2021-04-13 NOTE — Progress Notes (Signed)
Virtual Visit via Telephone Note  I connected with [name] on 04/07/2021 at 3: 40 pm by telephone and verified that I am speaking with the correct person using two identifiers.  Location: Patient: home Provider: office   I discussed the limitations, risks, security and privacy concerns of performing an evaluation and management service by telephone and the availability of in person appointments. I also discussed with the patient that there may be a patient responsible charge related to this service. The patient expressed understanding and agreed to proceed.   History of Present Illness: F/U chronic problems and address any new or current concerns. Review and update medications and allergies. Review recent lab and radiologic data . Update routine health maintainace. Review an encourage improved health habits to include nutrition, exercise and  sleep . C/o disabling generalized muscle spasm, dose of muscle relaxant has been increased by neurology, but still persists, will refer to MD Decided not to stop cymbalta to start adderall will continue to function without medicatin as she has for yars  Denies recent fever or chills. Denies sinus pressure, nasal congestion, ear pain or sore throat. Denies chest congestion, productive cough or wheezing. Denies chest pains, palpitations and leg swelling Denies abdominal pain, nausea, vomiting,diarrhea or constipation.   Denies dysuria, frequency, hesitancy   Denies depression,  uncontrolled anxiety or insomnia. Denies skin break down or rash.       Observations/Objective: BP 110/61   Ht 5' 4.5" (1.638 m)   Wt 239 lb (108.4 kg)   BMI 40.39 kg/m  Good communication with no confusion and intact memory. Alert and oriented x 3 No signs of respiratory distress during speech    Assessment and Plan: Muscle spasm Ongoing muscle spasm severe and disabling at times, neurology to eval and maange  Morbid obesity ([place])  Patient  re-educated about  the importance of commitment to a  minimum of 150 minutes of exercise per week as able.  The importance of healthy food choices with portion control discussed, as well as eating regularly and within a 12 hour window most days. The need to choose "clean , green" food 50 to 75% of the time is discussed, as well as to make water the primary drink and set a goal of 64 ounces water daily.    Weight /BMI 04/07/2021 02/13/2021 01/15/2021  WEIGHT 239 lb 237 lb 6.4 oz 232 lb  HEIGHT 5' 4.5" 5' 4.5" 5' 4.5"  BMI 40.39 kg/m2 40.12 kg/m2 39.21 kg/m2  Some encounter information is confidential and restricted. Go to Review Flowsheets activity to see all data.      Spinal stenosis of lumbar region with radiculopathy Pain management through pain clinic  Hypothyroidism Controlled, no change in medication   Hyperlipidemia LDL goal <100 Hyperlipidemia:Low fat diet discussed and encouraged.   Lipid Panel  Lab Results  Component Value Date   CHOL 146 01/15/2021   HDL 42 01/15/2021   LDLCALC 60 01/15/2021   LDLDIRECT 138 (H) 09/15/2012   TRIG 278 (H) 01/15/2021   CHOLHDL 3.5 01/15/2021  needs ot reduce fat intake, not at goal Updated lab needed at/ before next visit.      Essential hypertension Controlled, no change in medication DASH diet and commitment to daily physical activity for a minimum of 30 minutes discussed and encouraged, as a part of hypertension management. The importance of attaining a healthy weight is also discussed.  BP/Weight 04/07/2021 03/28/2021 02/18/2021 02/13/2021 01/15/2021 10/31/2020 85/27/7824  Systolic BP 235 361 443 154 - -  179  Diastolic BP 61 55 68 75 - - 84  Wt. (Lbs) 239 - - 237.4 232 230 231  BMI 40.39 - - 40.12 39.21 38.87 39.04  Some encounter information is confidential and restricted. Go to Review Flowsheets activity to see all data.       ADHD, predominantly hyperactive type Has decided against treatment due to drug  interactions  Adjustment disorder with anxiety Continue current maintainance therapy    Follow Up Instructions:    I discussed the assessment and treatment plan with the patient. The patient was provided an opportunity to ask questions and all were answered. The patient agreed with the plan and demonstrated an understanding of the instructions.   The patient was advised to call back or seek an in-person evaluation if the symptoms worsen or if the condition fails to improve as anticipated.  I provided 22 minutes of non-face-to-face time during this encounter.   [name], MD

## 2021-04-13 NOTE — Assessment & Plan Note (Signed)
Hyperlipidemia:Low fat diet discussed and encouraged.   Lipid Panel  Lab Results  Component Value Date   CHOL 146 01/15/2021   HDL 42 01/15/2021   LDLCALC 60 01/15/2021   LDLDIRECT 138 (H) 09/15/2012   TRIG 278 (H) 01/15/2021   CHOLHDL 3.5 01/15/2021  needs ot reduce fat intake, not at goal Updated lab needed at/ before next visit.

## 2021-04-13 NOTE — Assessment & Plan Note (Signed)
Controlled, no change in medication  

## 2021-04-13 NOTE — Assessment & Plan Note (Signed)
Continue current maintainance therapy

## 2021-04-13 NOTE — Assessment & Plan Note (Signed)
Pain management through pain clinic 

## 2021-04-13 NOTE — Assessment & Plan Note (Signed)
Ongoing muscle spasm severe and disabling at times, neurology to eval and maange

## 2021-04-13 NOTE — Assessment & Plan Note (Signed)
Has decided against treatment due to drug interactions

## 2021-04-13 NOTE — Assessment & Plan Note (Signed)
  Patient re-educated about  the importance of commitment to a  minimum of 150 minutes of exercise per week as able.  The importance of healthy food choices with portion control discussed, as well as eating regularly and within a 12 hour window most days. The need to choose "clean , green" food 50 to 75% of the time is discussed, as well as to make water the primary drink and set a goal of 64 ounces water daily.    Weight /BMI 04/07/2021 02/13/2021 01/15/2021  WEIGHT 239 lb 237 lb 6.4 oz 232 lb  HEIGHT 5' 4.5" 5' 4.5" 5' 4.5"  BMI 40.39 kg/m2 40.12 kg/m2 39.21 kg/m2  Some encounter information is confidential and restricted. Go to Review Flowsheets activity to see all data.

## 2021-04-17 ENCOUNTER — Encounter (INDEPENDENT_AMBULATORY_CARE_PROVIDER_SITE_OTHER): Payer: Self-pay | Admitting: Gastroenterology

## 2021-04-17 ENCOUNTER — Ambulatory Visit (INDEPENDENT_AMBULATORY_CARE_PROVIDER_SITE_OTHER): Payer: Medicare Other | Admitting: Gastroenterology

## 2021-04-17 ENCOUNTER — Encounter (INDEPENDENT_AMBULATORY_CARE_PROVIDER_SITE_OTHER): Payer: Self-pay

## 2021-04-17 ENCOUNTER — Other Ambulatory Visit: Payer: Self-pay

## 2021-04-17 ENCOUNTER — Other Ambulatory Visit (INDEPENDENT_AMBULATORY_CARE_PROVIDER_SITE_OTHER): Payer: Self-pay

## 2021-04-17 VITALS — BP 138/91 | HR 132 | Temp 98.0°F | Ht 64.5 in | Wt 237.0 lb

## 2021-04-17 DIAGNOSIS — R140 Abdominal distension (gaseous): Secondary | ICD-10-CM | POA: Diagnosis not present

## 2021-04-17 DIAGNOSIS — R0789 Other chest pain: Secondary | ICD-10-CM | POA: Diagnosis not present

## 2021-04-17 DIAGNOSIS — K219 Gastro-esophageal reflux disease without esophagitis: Secondary | ICD-10-CM | POA: Diagnosis not present

## 2021-04-17 DIAGNOSIS — K529 Noninfective gastroenteritis and colitis, unspecified: Secondary | ICD-10-CM | POA: Diagnosis not present

## 2021-04-17 NOTE — Patient Instructions (Addendum)
Schedule EGD and colonoscopy. Referral to [facility] for breath test for SIBO Explained presumed etiology of IBS symptoms. Patient was counseled about the benefit of implementing a low FODMAP to improve symptoms and recurrent episodes. A dietary list was provided to the patient.

## 2021-04-17 NOTE — H&P (View-Only) (Signed)
Maylon Peppers, M.D. Gastroenterology & Hepatology [facility] [address] [place], [place] [address] Primary Care Physician: Fayrene Helper, MD [address] [place] [address]  Referring MD: PCP  Chief Complaint: Dysphagia, retrosternal chest pain, bloating, hoarseness/choking, constipation  History of Present Illness: [name] is a 60 y.o. female with PMH Paget's disease breast cancer s/p mastectomy, PE in 1997 on Xarelto and IVC filter placement s/p partial removal, anxiety, PSVT status post ablation,History of prior stroke, fibromyalgia, ovarian cyst status post BSO, complicated surgical history of partial colectomy due to redundant colon with primary anastomosis, perforated bowel due to gynecologic procedure complicated by enterocutaneous fistula that required multiple surgical procedures and bear claw placement complicated by recurrent episodes of SBO, who presents for evaluation of dysphagia, retrosternal chest pain, bloating, hoarseness/choking, constipation.  Patient has multiple complaints today as follows:  [name] main complaint today is that she has presented recurrent episodes of retrosternal chest pain since 2015. She reports episodes that last a few seconds to a few mintues, usually resolves on its own. She describes that her chest pain has been very severe and "she needs to find the reason and get it fixed".  States that she usually brings cold drinks to make it improve but it is getting to the point that is making her very uncomfortable. She also reports that for the last year she has presented episodes of hoarseness and feeling choking sensation intermittently. She is concerned as she has likes to sing but her voice has changed significantly. She was on Nexium previously but stopped it due to concerns of malignancy that she has read about. Has had some belching and burping for the last  year. Has tried to avoid food that can lead to her symptosm. She is currently on Prilosec 40 mg BID but does not feel it helps for her symptoms.  The patient also reports she presented episodes of bloating since 2015, which worsened for the last  Year. This has led to major discomfort. She has had longstanding history of constipation that she has tried to manage with over-the-counter laxatives. She uses stool softeners daily for management of constipation. Takes a "Equate laxatives" - has Senna and docusate. Takes 2 in the AM and 2 at night.  This regimen of medications leads to 10-15 BMs per day.  She has not tried decreasing the dose to once a day but states that if she does not take them she will have constipation for several days. She has fresh blood in her stool with every BM, she believes it is related to her multiple bowel movements and possible hemorrhoids  The patient denies having any nausea, vomiting, fever, chills,, melena, hematemesis, jaundice, pruritus or weight loss.  Notably she is on Dilaudid chronically.  Last TFT:DDUKGURK 2010, no report available Last Colonoscopy:2015 - She underwent colonoscopy for her enterocutaneous fistula closure. Per procedure note by Dr. Harl Bowie a bear claw endoclip was placed at that time and confirmed via fluoro.   FHx: neg for any gastrointestinal/liver disease, colon cancer aunt in her 46s, cousin breast cancer, multiple other members with other cancers as listed below Social: neg smoking, alcohol or illicit drug use  Past Medical History: Past Medical History:  Diagnosis Date  . Abdominal hernia 08/14/2019  . Abdominal pain 06/06/2019  . Acute bronchitis 05/01/2013  . Acute cystitis 06/09/2010   Qualifier: Diagnosis of  By: Cori Razor LPN, Brandi    . Acute renal insufficiency  08/28/2011  . Allergy    Phreesia 03/16/2021  . Anemia    Phreesia 10/29/2020  . Anxiety   . Anxiety state 03/16/2008   Qualifier: Diagnosis of  By: Dierdre Harness    . Arthritis     Phreesia 10/29/2020  . Back pain with radiation 07/17/2014  . Bilateral hand pain 07/27/2016  . Blood transfusion without reported diagnosis    Phreesia 10/29/2020  . Breast cancer ([place])    L breast- 2006  . Cancer ([place])    Phreesia 10/29/2020  . Carpal tunnel syndrome   . Chronic constipation   . Chronic pain syndrome    followed by [facility]---  back  . Clotting disorder ([place])    Phreesia 10/29/2020  . Depression   . Dermatitis 12/15/2011  . Educated about COVID-19 virus infection 05/14/2019  . Fall at home 01/26/2016  . Family history of adverse reaction to anesthesia    MOTHER--- PONV  . Fatigue 09/17/2012  . Fibromyalgia   . FIBROMYALGIA 03/16/2008   Qualifier: Diagnosis of  By: Dierdre Harness    . GERD (gastroesophageal reflux disease)   . Headache disorder 01/16/2013  . Headache syndrome 01/26/2016  . Hip pain, right 07/08/2019  . History of MRSA infection    lip abscess  . History of ovarian cyst 06/2011   s/p  BSO  . History of pulmonary embolus (PE) 1997   post EP with ablation pulmonary veouns for SVT/ Atrial Fib.  . History of supraventricular tachycardia    s/p  ablation 1996  and 1997  by dr Caryl Comes  . History of TIA (transient ischemic attack) 1997   post op EP ablation PE  . Hyperlipidemia   . Hyperlipidemia LDL goal <100 03/16/2008   Qualifier: Diagnosis of  By: Dierdre Harness    . Hypothyroidism    followed by pcp  . Incisional hernia   . Insomnia 12/15/2011  . Intermittent palpitations 08/22/2017  . Interstitial cystitis    09-13-2018   per pt last flare-up  May 2019 (followed by pcp)  . INTERSTITIAL CYSTITIS 02/17/2011   Qualifier: Diagnosis of  By: Moshe Cipro MD, Joycelyn Schmid    . Iron deficiency anemia    09-13-2018  PER PT STABLE  . Irregular heart rate 07/25/2013  . Lipoma of back    upper  . Low back pain radiating to right leg 07/04/2019  . Low ferritin level 06/14/2014  . MDD (major depressive disorder), single episode, in full remission ([place])  10/27/2017  . Medically noncompliant 02/28/2015   Multiple missed appointments, both follow-up appointments and lab appointments.   . Metabolic syndrome X 8/84/1660   Qualifier: Diagnosis of  By: Moshe Cipro MD, Margaret  hBA1c is 5.8 in 02/2013   . Migraines   . Morbid obesity ([place]) 03/27/2013  . Muscle spasm 01/03/2020  . Nausea alone 07/17/2014  . NECK PAIN, CHRONIC 03/16/2008   Qualifier: Diagnosis of  By: Dierdre Harness    . Normal coronary arteries    a. by CT 12/2018.  . Obesity 03/16/2008   Qualifier: Diagnosis of  By: Dierdre Harness    . Oral ulceration 08/23/2014   Presented at 06/14/2014 visit   . Other malaise and fatigue 03/16/2008   Centricity Description: FATIGUE, CHRONIC Qualifier: Diagnosis of  By: Dierdre Harness   Centricity Description: FATIGUE Qualifier: Diagnosis of  By: Christy Sartorius, [place]    . OVARIAN CYST 12/26/2009   Qualifier: Diagnosis of  By: Moshe Cipro MD, Joycelyn Schmid    . Paget disease  of breast, left ([facility] [place])   . Paget's disease of breast, left ([place]) 03/16/2008   Qualifier: Diagnosis of  By: Dierdre Harness  Left diagnosed in 2006 F/h breast cancer x 15 family members  . Partial small bowel obstruction ([place]) 07/04/2012  . PONV (postoperative nausea and vomiting)    SEVERE  . Postsurgical menopause 11/13/2011  . Presence of IVC filter 06/06/2019  . PSVT (paroxysmal supraventricular tachycardia) ([place])    [place]  . Recurrent oral herpes simplex infection 10/13/2017  . ROM (right otitis media) 01/23/2013  . S/P insertion of IVC (inferior vena caval) filter 05/08/2005   [place] (non-retrievable)  /  dx 2019  a leg of filter is protruding thru the vena cava in to right L2 vertebral body (09-13-2018  per pt having surgery to remove filter in [place])  . S/P radiofrequency ablation operation for arrhythmia 1996   1996  and 1997,   SVT and Atrial Fib  . Sinusitis, chronic 01/26/2016  . SMALL BOWEL OBSTRUCTION, HX OF 08/07/2008   Annotation: obstruction w/ adhesions led  to partial colectomy Qualifier: Diagnosis of  By: Craige Cotta    . Stroke [facility])    Phreesia 10/29/2020  . Swelling of hand 09/17/2012  . Syncope   . Tachycardia 02/03/2010   Qualifier: Diagnosis of  By: Via LPN, Jeani Hawking    . Thyroid disease    Phreesia 10/29/2020  . Ulcer 12/15/2011  . Urinary frequency 07/18/2014  . Vaginitis and vulvovaginitis 05/10/2013  . Vitamin D deficiency 05/05/2015  . Wears glasses     Past Surgical History: Past Surgical History:  Procedure Laterality Date  . ABDOMINAL HYSTERECTOMY  1987  . ANTERIOR CERVICAL DECOMP/DISCECTOMY FUSION  03-07-2002   dr elsner  @[facility]    C 4 -- 5  . APPENDECTOMY  1980  . AUGMENTATION MAMMAPLASTY Right 2006  . BILATERAL SALPINGOOPHORECTOMY  07/24/2011   via Explor. Lap. w/ intraoperative perf. bowel repair  . BREAST BIOPSY Right 2019   benign  . BREAST ENHANCEMENT SURGERY Bilateral 1993  . BREAST IMPLANT REMOVAL Bilateral   . BREAST SURGERY N/A    Phreesia 10/29/2020  . CARDIAC CATHETERIZATION  07-06-2003   dr Darnell Level brodie   normal coronaries and LVF  . CARDIAC ELECTROPHYSIOLOGY STUDY AND ABLATION  1996  and 1997  . CARDIOVASCULAR STRESS TEST  11-18-2015   dr Caryl Comes   normal nuclear study w/ no ischemia/  normal LV function and wall motion , ef 84%  . CARPAL TUNNEL RELEASE Right ?  . [place]  . CESAREAN SECTION N/A    Phreesia 10/29/2020  . CHOLECYSTECTOMY N/A    Phreesia 10/29/2020  . COLON SURGERY    . CYSTO/  HYDRODISTENTION/  INSTILATION THERAPY  MULTIPLE  . ENTEROCUTANEOUS FISTULA CLOSURE  multiple   last one 2015 with small bowel resection  . EXPLORATORY LAPAROTOMY INCISIONAL VENTRAL HERNIA REPAIR / RESECTION SMALL BOWEL  11-09-2014   @[facility]   . FRACTURE SURGERY N/A    Phreesia 10/29/2020  . JOINT REPLACEMENT N/A    Phreesia 10/29/2020  . LIPOMA EXCISION Right 09/16/2018   Procedure: EXCISION LIPOMA UPPER BACK;  Surgeon: Clovis Riley, MD;  Location: [facility];  Service: General;   Laterality: Right;  . MASTECTOMY Left 2006   w/ reconstruction on left (paget's disease)  and right breast augmentation  . SMALL INTESTINE SURGERY N/A    Phreesia 10/29/2020  . SPINE SURGERY N/A    Phreesia 10/29/2020  . TOTAL  COLECTOMY  08-04-2002    @[facility]    AND CHOLECYSTECTOMY  (colonic inertia)  . TRANSTHORACIC ECHOCARDIOGRAM  02/04/2016   ef 60-65%,  grade 2 diastolic dysfunction/  mild MR  . TUBAL LIGATION N/A    Phreesia 03/16/2021  . VENA CAVA FILTER PLACEMENT  05/08/2005   @[facility]    [place] (non-retrievable)  . WIDE EXCISION PERIRECTAL ABSCESSES  09-22-2005   @ [facility]    Family History: Family History  Problem Relation Age of Onset  . Diabetes Mother   . Heart disease Mother   . Hypertension Mother   . Heart disease Father   . Hyperlipidemia Father   . Hypertension Father   . Alcohol abuse Father   . Colon cancer Maternal Aunt   . Breast cancer Maternal Aunt   . Cancer Maternal Uncle        mets  . Bone cancer Maternal Grandfather        mets  . Ovarian cancer Cousin 43  . Breast cancer Cousin   . Prostate cancer Maternal Uncle   . Breast cancer Maternal Aunt   . Brain cancer Maternal Aunt     Social History: Social History   Tobacco Use  Smoking Status Never Smoker  Smokeless Tobacco Never Used   Social History   Substance and Sexual Activity  Alcohol Use No   Social History   Substance and Sexual Activity  Drug Use No    Allergies: Allergies  Allergen Reactions  . Nitrofurantoin Hives  . Crestor [Rosuvastatin Calcium] Other (See Comments)    Generalized cramps  . Bisacodyl Other (See Comments)    Makes patient feel like she is having cramps Makes patient feel like she is having cramps  . Clarithromycin Hives and Other (See Comments)    Other reaction(s): Other (See Comments) Cluster migraines  Cluster migraines  . Clarithromycin Other (See Comments)    Cluster migraines   . Clindamycin Hcl Hives  . Codeine Itching  . Iron Sucrose  Other (See Comments)    Flushing- required benadryl and solu medrol  . Monosodium Glutamate Other (See Comments)    Cluster migraines  Cluster migraines  . Scopolamine Hbr Other (See Comments)    Cluster migraines, impaired vision  . Other Hives, Itching, Rash and Swelling    Medications: Current Outpatient Medications  Medication Sig Dispense Refill  . ALPRAZolam (XANAX) 1 MG tablet Take 1 tablet (1 mg total) by mouth 2 (two) times daily. 60 tablet 2  . aspirin 81 MG chewable tablet Chew 81 mg by mouth daily.    . butalbital-acetaminophen-caffeine (FIORICET) 50-325-40 MG tablet Take 1 tablet by mouth as directed.    . conjugated estrogens (PREMARIN) vaginal cream Place fingertip amount intravaginally 2 to 3 times per week 42.5 g 1  . cyanocobalamin (,VITAMIN B-12,) 1000 MCG/ML injection INJECT 1 ML INTO THE MUSCLE EVERY 30 DAYS.  0  . cyclobenzaprine (FLEXERIL) 10 MG tablet TAKE 1 TABLET BY MOUTH TWICE DAILY AS NEEDED FOR MUSCLE SPASMS (Patient taking differently: Take 10 mg by mouth at bedtime.) 180 tablet 0  . cycloSPORINE (RESTASIS) 0.05 % ophthalmic emulsion Place 1 drop into both eyes 2 (two) times daily.    . DULoxetine (CYMBALTA) 30 MG capsule Take 60 mg by mouth daily.    . ergocalciferol (VITAMIN D2) 1.25 MG (50000 UT) capsule Take 1 capsule by mouth once a week.    . ezetimibe (ZETIA) 10 MG tablet Take 1 tablet (10 mg total) by mouth daily. 90 tablet 3  .  fluticasone (FLONASE) 50 MCG/ACT nasal spray Place 2 sprays into both nostrils daily. 16 g 6  . HYDROmorphone (DILAUDID) 4 MG tablet Dilaudid 4 mg tablet  Take 1 tablet 3 times a day by oral route as needed.    Javier Docker Oil 1000 MG CAPS Take 1,000 mg by mouth at bedtime.    Marland Kitchen levothyroxine (SYNTHROID) 125 MCG tablet TAKE 1 TABLET(125 MCG) BY MOUTH DAILY BEFORE BREAKFAST 90 tablet 1  . MAGNESIUM-OXIDE 400 (241.3 Mg) MG tablet TAKE 1 TABLET BY MOUTH 2 TIMES A DAY 180 tablet 2  . meclizine (ANTIVERT) 25 MG tablet Take 1 tablet  (25 mg total) by mouth 3 (three) times daily as needed for dizziness. 30 tablet 0  . metoprolol tartrate (LOPRESSOR) 25 MG tablet Take 1 tablet (25 mg total) by mouth 2 (two) times daily. 180 tablet 3  . Multiple Vitamins-Minerals (ICAPS AREDS 2) CAPS Take by mouth at bedtime.    Marland Kitchen omeprazole (PRILOSEC) 40 MG capsule Take 1 capsule (40 mg total) by mouth daily. 180 capsule 3  . OVER THE COUNTER MEDICATION Bone strong one per day.    . pravastatin (PRAVACHOL) 40 MG tablet TAKE 1 TABLET(40 MG) BY MOUTH DAILY 90 tablet 3  . senna-docusate (SENOKOT-S) 8.6-50 MG per tablet Take 2 tablets by mouth 3 (three) times daily. constipation    . topiramate (TOPAMAX) 100 MG tablet Take 1 tablet by mouth 2 (two) times daily.    . Vitamin D, Ergocalciferol, (DRISDOL) 50000 units CAPS capsule Take 50,000 Units by mouth every 7 (seven) days.     Alveda Reasons 20 MG TABS tablet Take 1 tablet (20 mg total) by mouth daily. 30 tablet 5  . Glycerin, Laxative, 80.7 % SUPP Place 1 suppository rectally at bedtime as needed (for constipation).  (Patient not taking: Reported on 02/13/2021)     No current facility-administered medications for this visit.    Review of Systems: GENERAL: negative for malaise, night sweats HEENT: No changes in hearing or vision, no nose bleeds or other nasal problems. NECK: Negative for lumps, goiter, pain and significant neck swelling RESPIRATORY: Negative for cough, wheezing CARDIOVASCULAR: Negative for chest pain, leg swelling, palpitations, orthopnea GI: SEE HPI MUSCULOSKELETAL: Negative for joint pain or swelling, back pain, and muscle pain. SKIN: Negative for lesions, rash PSYCH: Negative for sleep disturbance, mood disorder and recent psychosocial stressors. HEMATOLOGY Negative for prolonged bleeding, bruising easily, and swollen nodes. ENDOCRINE: Negative for cold or heat intolerance, polyuria, polydipsia and goiter. NEURO: negative for tremor, gait imbalance, syncope and seizures. The  remainder of the review of systems is noncontributory.   Physical Exam: BP (!) 138/91 (BP Location: Left Arm, Patient Position: Sitting, Cuff Size: Large)   Pulse (!) 132   Temp 98 F (36.7 C) (Oral)   Ht 5' 4.5" (1.638 m)   Wt 237 lb (107.5 kg)   BMI 40.05 kg/m  GENERAL: The patient is AO x3, in no acute distress. Obese. HEENT: Head is normocephalic and atraumatic. EOMI are intact. Mouth is well hydrated and without lesions. NECK: Supple. No masses LUNGS: Clear to auscultation. No presence of rhonchi/wheezing/rales. Adequate chest expansion HEART: RRR, normal s1 and s2. ABDOMEN: is mildly distended, has some discomfort upon palpation of the upper abdominal area, no guarding, no peritoneal signs, and nondistended. BS +. Ventral hernia. Has multiple adbominal scars. EXTREMITIES: Without any cyanosis, clubbing, rash, lesions or edema. NEUROLOGIC: AOx3, no focal motor deficit. SKIN: no jaundice, no rashes   Imaging/Labs: as above  I  personally reviewed and interpreted the available labs, imaging and endoscopic files.  Impression and Plan: Nyeema Want is a 60 y.o. female with PMH Paget's disease breast cancer s/p mastectomy, PE in 1997 on Xarelto and IVC filter placement s/p partial removal, anxiety, PSVT status post ablation,History of prior stroke, fibromyalgia, ovarian cyst status post BSO, complicated surgical history of partial colectomy due to redundant colon with primary anastomosis, perforated bowel due to gynecologic procedure complicated by enterocutaneous fistula that required multiple surgical procedures and bear claw placement complicated by recurrent episodes of SBO, who presents for evaluation of dysphagia, retrosternal chest pain, bloating, hoarseness/choking, constipation.  Regarding her chest pain and episode of hoarseness, explained to patient that the symptoms could be related to her GERD however she may also be presenting some esophageal dysmotility leading  to her current presentation, as she has also presented some episodes of dysphagia.  It is important to note that she is currently taking Dilaudid which can cause motility abnormalities such as distal esophageal spasm or IEM.  For now, we will investigate this further with an EGD with possible esophageal biopsies and dilation.  Small bowel biopsy will be obtained at that time.  She has been taking Flovent discomfort.  Given her history of previous surgeries, I consider it appropriate to rule out small intestinal bacterial overgrowth.  However the patient was given a dietary list as this could also be related to bowel hypersensitivity in the setting of prior surgeries and chronic opiate intake.  If daily is unrevealing, will discuss whether it is adequate to perform esophageal manometry and pH impedance testing while on opiates.  She is not interested in starting any new medications as she is trying to cut down the list of medicines that she is currently taking.  I think this is adequate.  She has also brings in an episode of rectal bleeding, likely related to the amount of bowel movements she is presenting.  She is due for colorectal cancer screening, for which I will schedule a colonoscopy.  I advised her to attempt to increase her laxative use to once a day and determine if this leads to "normalization" of her abdominal bowel movements.  Patient understood and agreed  -Schedule EGD and colonoscopy. - Referral to [facility] for breath test for SIBO -Explained presumed etiology of possible IBS symptoms. Patient was counseled about the benefit of implementing a low FODMAP to improve symptoms and recurrent episodes. A dietary list was provided to the patient. - Will consider esopphageal manometry and ph  Impedance testing on medication and while on opiates based on EGD findings   All questions were answered.      Maylon Peppers, MD Gastroenterology and Hepatology [facility]

## 2021-04-17 NOTE — Progress Notes (Signed)
Maylon Peppers, M.D. Gastroenterology & Hepatology [facility] [address] [place], [place] [address] Primary Care Physician: Fayrene Helper, MD [address] [place] [address]  Referring MD: PCP  Chief Complaint: Dysphagia, retrosternal chest pain, bloating, hoarseness/choking, constipation  History of Present Illness: [name] is a 60 y.o. female with PMH Paget's disease breast cancer s/p mastectomy, PE in 1997 on Xarelto and IVC filter placement s/p partial removal, anxiety, PSVT status post ablation,History of prior stroke, fibromyalgia, ovarian cyst status post BSO, complicated surgical history of partial colectomy due to redundant colon with primary anastomosis, perforated bowel due to gynecologic procedure complicated by enterocutaneous fistula that required multiple surgical procedures and bear claw placement complicated by recurrent episodes of SBO, who presents for evaluation of dysphagia, retrosternal chest pain, bloating, hoarseness/choking, constipation.  Patient has multiple complaints today as follows:  [name] main complaint today is that she has presented recurrent episodes of retrosternal chest pain since 2015. She reports episodes that last a few seconds to a few mintues, usually resolves on its own. She describes that her chest pain has been very severe and "she needs to find the reason and get it fixed".  States that she usually brings cold drinks to make it improve but it is getting to the point that is making her very uncomfortable. She also reports that for the last year she has presented episodes of hoarseness and feeling choking sensation intermittently. She is concerned as she has likes to sing but her voice has changed significantly. She was on Nexium previously but stopped it due to concerns of malignancy that she has read about. Has had some belching and burping for the last  year. Has tried to avoid food that can lead to her symptosm. She is currently on Prilosec 40 mg BID but does not feel it helps for her symptoms.  The patient also reports she presented episodes of bloating since 2015, which worsened for the last  Year. This has led to major discomfort. She has had longstanding history of constipation that she has tried to manage with over-the-counter laxatives. She uses stool softeners daily for management of constipation. Takes a "Equate laxatives" - has Senna and docusate. Takes 2 in the AM and 2 at night.  This regimen of medications leads to 10-15 BMs per day.  She has not tried decreasing the dose to once a day but states that if she does not take them she will have constipation for several days. She has fresh blood in her stool with every BM, she believes it is related to her multiple bowel movements and possible hemorrhoids  The patient denies having any nausea, vomiting, fever, chills,, melena, hematemesis, jaundice, pruritus or weight loss.  Notably she is on Dilaudid chronically.  Last VOJ:[id number] 2010, no report available Last Colonoscopy:2015 - She underwent colonoscopy for her enterocutaneous fistula closure. Per procedure note by Dr. Harl Bowie a bear claw endoclip was placed at that time and confirmed via fluoro.   FHx: neg for any gastrointestinal/liver disease, colon cancer aunt in her 59s, cousin breast cancer, multiple other members with other cancers as listed below Social: neg smoking, alcohol or illicit drug use  Past Medical History: Past Medical History:  Diagnosis Date  . Abdominal hernia 08/14/2019  . Abdominal pain 06/06/2019  . Acute bronchitis 05/01/2013  . Acute cystitis 06/09/2010   Qualifier: Diagnosis of  By: Cori Razor LPN, Brandi    . Acute renal insufficiency  08/28/2011  . Allergy    Phreesia 03/16/2021  . Anemia    Phreesia 10/29/2020  . Anxiety   . Anxiety state 03/16/2008   Qualifier: Diagnosis of  By: Dierdre Harness    . Arthritis     Phreesia 10/29/2020  . Back pain with radiation 07/17/2014  . Bilateral hand pain 07/27/2016  . Blood transfusion without reported diagnosis    Phreesia 10/29/2020  . Breast cancer ([place])    L breast- 2006  . Cancer ([place])    Phreesia 10/29/2020  . Carpal tunnel syndrome   . Chronic constipation   . Chronic pain syndrome    followed by [facility]---  back  . Clotting disorder ([place])    Phreesia 10/29/2020  . Depression   . Dermatitis 12/15/2011  . Educated about COVID-19 virus infection 05/14/2019  . Fall at home 01/26/2016  . Family history of adverse reaction to anesthesia    MOTHER--- PONV  . Fatigue 09/17/2012  . Fibromyalgia   . FIBROMYALGIA 03/16/2008   Qualifier: Diagnosis of  By: Dierdre Harness    . GERD (gastroesophageal reflux disease)   . Headache disorder 01/16/2013  . Headache syndrome 01/26/2016  . Hip pain, right 07/08/2019  . History of MRSA infection    lip abscess  . History of ovarian cyst 06/2011   s/p  BSO  . History of pulmonary embolus (PE) 1997   post EP with ablation pulmonary veouns for SVT/ Atrial Fib.  . History of supraventricular tachycardia    s/p  ablation 1996  and 1997  by dr Caryl Comes  . History of TIA (transient ischemic attack) 1997   post op EP ablation PE  . Hyperlipidemia   . Hyperlipidemia LDL goal <100 03/16/2008   Qualifier: Diagnosis of  By: Dierdre Harness    . Hypothyroidism    followed by pcp  . Incisional hernia   . Insomnia 12/15/2011  . Intermittent palpitations 08/22/2017  . Interstitial cystitis    09-13-2018   per pt last flare-up  May 2019 (followed by pcp)  . INTERSTITIAL CYSTITIS 02/17/2011   Qualifier: Diagnosis of  By: Moshe Cipro MD, Joycelyn Schmid    . Iron deficiency anemia    09-13-2018  PER PT STABLE  . Irregular heart rate 07/25/2013  . Lipoma of back    upper  . Low back pain radiating to right leg 07/04/2019  . Low ferritin level 06/14/2014  . MDD (major depressive disorder), single episode, in full remission ([place])  10/27/2017  . Medically noncompliant 02/28/2015   Multiple missed appointments, both follow-up appointments and lab appointments.   . Metabolic syndrome X 02/22/3334   Qualifier: Diagnosis of  By: Moshe Cipro MD, Margaret  hBA1c is 5.8 in 02/2013   . Migraines   . Morbid obesity ([place]) 03/27/2013  . Muscle spasm 01/03/2020  . Nausea alone 07/17/2014  . NECK PAIN, CHRONIC 03/16/2008   Qualifier: Diagnosis of  By: Dierdre Harness    . Normal coronary arteries    a. by CT 12/2018.  . Obesity 03/16/2008   Qualifier: Diagnosis of  By: Dierdre Harness    . Oral ulceration 08/23/2014   Presented at 06/14/2014 visit   . Other malaise and fatigue 03/16/2008   Centricity Description: FATIGUE, CHRONIC Qualifier: Diagnosis of  By: Dierdre Harness   Centricity Description: FATIGUE Qualifier: Diagnosis of  By: Christy Sartorius, [place]    . OVARIAN CYST 12/26/2009   Qualifier: Diagnosis of  By: Moshe Cipro MD, Joycelyn Schmid    . Paget disease  of breast, left ([place])   . Paget's disease of breast, left ([place]) 03/16/2008   Qualifier: Diagnosis of  By: Dierdre Harness  Left diagnosed in 2006 F/h breast cancer x 15 family members  . Partial small bowel obstruction ([place]) 07/04/2012  . PONV (postoperative nausea and vomiting)    SEVERE  . Postsurgical menopause 11/13/2011  . Presence of IVC filter 06/06/2019  . PSVT (paroxysmal supraventricular tachycardia) ([place])    [place]  . Recurrent oral herpes simplex infection 10/13/2017  . ROM (right otitis media) 01/23/2013  . S/P insertion of IVC (inferior vena caval) filter 05/08/2005   [place] (non-retrievable)  /  dx 2019  a leg of filter is protruding thru the vena cava in to right L2 vertebral body (09-13-2018  per pt having surgery to remove filter in [place])  . S/P radiofrequency ablation operation for arrhythmia 1996   1996  and 1997,   SVT and Atrial Fib  . Sinusitis, chronic 01/26/2016  . SMALL BOWEL OBSTRUCTION, HX OF 08/07/2008   Annotation: obstruction w/ adhesions led  to partial colectomy Qualifier: Diagnosis of  By: Craige Cotta    . Stroke [facility])    Phreesia 10/29/2020  . Swelling of hand 09/17/2012  . Syncope   . Tachycardia 02/03/2010   Qualifier: Diagnosis of  By: Via LPN, Jeani Hawking    . Thyroid disease    Phreesia 10/29/2020  . Ulcer 12/15/2011  . Urinary frequency 07/18/2014  . Vaginitis and vulvovaginitis 05/10/2013  . Vitamin D deficiency 05/05/2015  . Wears glasses     Past Surgical History: Past Surgical History:  Procedure Laterality Date  . ABDOMINAL HYSTERECTOMY  1987  . ANTERIOR CERVICAL DECOMP/DISCECTOMY FUSION  03-07-2002   dr elsner  @[facility]    C 4 -- 5  . APPENDECTOMY  1980  . AUGMENTATION MAMMAPLASTY Right 2006  . BILATERAL SALPINGOOPHORECTOMY  07/24/2011   via Explor. Lap. w/ intraoperative perf. bowel repair  . BREAST BIOPSY Right 2019   benign  . BREAST ENHANCEMENT SURGERY Bilateral 1993  . BREAST IMPLANT REMOVAL Bilateral   . BREAST SURGERY N/A    Phreesia 10/29/2020  . CARDIAC CATHETERIZATION  07-06-2003   dr Darnell Level brodie   normal coronaries and LVF  . CARDIAC ELECTROPHYSIOLOGY STUDY AND ABLATION  1996  and 1997  . CARDIOVASCULAR STRESS TEST  11-18-2015   dr Caryl Comes   normal nuclear study w/ no ischemia/  normal LV function and wall motion , ef 84%  . CARPAL TUNNEL RELEASE Right ?  . [place]  . CESAREAN SECTION N/A    Phreesia 10/29/2020  . CHOLECYSTECTOMY N/A    Phreesia 10/29/2020  . COLON SURGERY    . CYSTO/  HYDRODISTENTION/  INSTILATION THERAPY  MULTIPLE  . ENTEROCUTANEOUS FISTULA CLOSURE  multiple   last one 2015 with small bowel resection  . EXPLORATORY LAPAROTOMY INCISIONAL VENTRAL HERNIA REPAIR / RESECTION SMALL BOWEL  11-09-2014   @[facility]   . FRACTURE SURGERY N/A    Phreesia 10/29/2020  . JOINT REPLACEMENT N/A    Phreesia 10/29/2020  . LIPOMA EXCISION Right 09/16/2018   Procedure: EXCISION LIPOMA UPPER BACK;  Surgeon: Clovis Riley, MD;  Location: [facility];  Service: General;   Laterality: Right;  . MASTECTOMY Left 2006   w/ reconstruction on left (paget's disease)  and right breast augmentation  . SMALL INTESTINE SURGERY N/A    Phreesia 10/29/2020  . SPINE SURGERY N/A    Phreesia 10/29/2020  . TOTAL  COLECTOMY  08-04-2002    @[facility]    AND CHOLECYSTECTOMY  (colonic inertia)  . TRANSTHORACIC ECHOCARDIOGRAM  02/04/2016   ef 60-65%,  grade 2 diastolic dysfunction/  mild MR  . TUBAL LIGATION N/A    Phreesia 03/16/2021  . VENA CAVA FILTER PLACEMENT  05/08/2005   @[facility]    [facility] (non-retrievable)  . WIDE EXCISION PERIRECTAL ABSCESSES  09-22-2005   @ [facility]    Family History: Family History  Problem Relation Age of Onset  . Diabetes Mother   . Heart disease Mother   . Hypertension Mother   . Heart disease Father   . Hyperlipidemia Father   . Hypertension Father   . Alcohol abuse Father   . Colon cancer Maternal Aunt   . Breast cancer Maternal Aunt   . Cancer Maternal Uncle        mets  . Bone cancer Maternal Grandfather        mets  . Ovarian cancer Cousin 58  . Breast cancer Cousin   . Prostate cancer Maternal Uncle   . Breast cancer Maternal Aunt   . Brain cancer Maternal Aunt     Social History: Social History   Tobacco Use  Smoking Status Never Smoker  Smokeless Tobacco Never Used   Social History   Substance and Sexual Activity  Alcohol Use No   Social History   Substance and Sexual Activity  Drug Use No    Allergies: Allergies  Allergen Reactions  . Nitrofurantoin Hives  . Crestor [Rosuvastatin Calcium] Other (See Comments)    Generalized cramps  . Bisacodyl Other (See Comments)    Makes patient feel like she is having cramps Makes patient feel like she is having cramps  . Clarithromycin Hives and Other (See Comments)    Other reaction(s): Other (See Comments) Cluster migraines  Cluster migraines  . Clarithromycin Other (See Comments)    Cluster migraines   . Clindamycin Hcl Hives  . Codeine Itching  . Iron Sucrose  Other (See Comments)    Flushing- required benadryl and solu medrol  . Monosodium Glutamate Other (See Comments)    Cluster migraines  Cluster migraines  . Scopolamine Hbr Other (See Comments)    Cluster migraines, impaired vision  . Other Hives, Itching, Rash and Swelling    Medications: Current Outpatient Medications  Medication Sig Dispense Refill  . ALPRAZolam (XANAX) 1 MG tablet Take 1 tablet (1 mg total) by mouth 2 (two) times daily. 60 tablet 2  . aspirin 81 MG chewable tablet Chew 81 mg by mouth daily.    . butalbital-acetaminophen-caffeine (FIORICET) 50-325-40 MG tablet Take 1 tablet by mouth as directed.    . conjugated estrogens (PREMARIN) vaginal cream Place fingertip amount intravaginally 2 to 3 times per week 42.5 g 1  . cyanocobalamin (,VITAMIN B-12,) 1000 MCG/ML injection INJECT 1 ML INTO THE MUSCLE EVERY 30 DAYS.  0  . cyclobenzaprine (FLEXERIL) 10 MG tablet TAKE 1 TABLET BY MOUTH TWICE DAILY AS NEEDED FOR MUSCLE SPASMS (Patient taking differently: Take 10 mg by mouth at bedtime.) 180 tablet 0  . cycloSPORINE (RESTASIS) 0.05 % ophthalmic emulsion Place 1 drop into both eyes 2 (two) times daily.    . DULoxetine (CYMBALTA) 30 MG capsule Take 60 mg by mouth daily.    . ergocalciferol (VITAMIN D2) 1.25 MG (50000 UT) capsule Take 1 capsule by mouth once a week.    . ezetimibe (ZETIA) 10 MG tablet Take 1 tablet (10 mg total) by mouth daily. 90 tablet 3  .  fluticasone (FLONASE) 50 MCG/ACT nasal spray Place 2 sprays into both nostrils daily. 16 g 6  . HYDROmorphone (DILAUDID) 4 MG tablet Dilaudid 4 mg tablet  Take 1 tablet 3 times a day by oral route as needed.    Javier Docker Oil 1000 MG CAPS Take 1,000 mg by mouth at bedtime.    Marland Kitchen levothyroxine (SYNTHROID) 125 MCG tablet TAKE 1 TABLET(125 MCG) BY MOUTH DAILY BEFORE BREAKFAST 90 tablet 1  . MAGNESIUM-OXIDE 400 (241.3 Mg) MG tablet TAKE 1 TABLET BY MOUTH 2 TIMES A DAY 180 tablet 2  . meclizine (ANTIVERT) 25 MG tablet Take 1 tablet  (25 mg total) by mouth 3 (three) times daily as needed for dizziness. 30 tablet 0  . metoprolol tartrate (LOPRESSOR) 25 MG tablet Take 1 tablet (25 mg total) by mouth 2 (two) times daily. 180 tablet 3  . Multiple Vitamins-Minerals (ICAPS AREDS 2) CAPS Take by mouth at bedtime.    Marland Kitchen omeprazole (PRILOSEC) 40 MG capsule Take 1 capsule (40 mg total) by mouth daily. 180 capsule 3  . OVER THE COUNTER MEDICATION Bone strong one per day.    . pravastatin (PRAVACHOL) 40 MG tablet TAKE 1 TABLET(40 MG) BY MOUTH DAILY 90 tablet 3  . senna-docusate (SENOKOT-S) 8.6-50 MG per tablet Take 2 tablets by mouth 3 (three) times daily. constipation    . topiramate (TOPAMAX) 100 MG tablet Take 1 tablet by mouth 2 (two) times daily.    . Vitamin D, Ergocalciferol, (DRISDOL) 50000 units CAPS capsule Take 50,000 Units by mouth every 7 (seven) days.     Alveda Reasons 20 MG TABS tablet Take 1 tablet (20 mg total) by mouth daily. 30 tablet 5  . Glycerin, Laxative, 80.7 % SUPP Place 1 suppository rectally at bedtime as needed (for constipation).  (Patient not taking: Reported on 02/13/2021)     No current facility-administered medications for this visit.    Review of Systems: GENERAL: negative for malaise, night sweats HEENT: No changes in hearing or vision, no nose bleeds or other nasal problems. NECK: Negative for lumps, goiter, pain and significant neck swelling RESPIRATORY: Negative for cough, wheezing CARDIOVASCULAR: Negative for chest pain, leg swelling, palpitations, orthopnea GI: SEE HPI MUSCULOSKELETAL: Negative for joint pain or swelling, back pain, and muscle pain. SKIN: Negative for lesions, rash PSYCH: Negative for sleep disturbance, mood disorder and recent psychosocial stressors. HEMATOLOGY Negative for prolonged bleeding, bruising easily, and swollen nodes. ENDOCRINE: Negative for cold or heat intolerance, polyuria, polydipsia and goiter. NEURO: negative for tremor, gait imbalance, syncope and seizures. The  remainder of the review of systems is noncontributory.   Physical Exam: BP (!) 138/91 (BP Location: Left Arm, Patient Position: Sitting, Cuff Size: Large)   Pulse (!) 132   Temp 98 F (36.7 C) (Oral)   Ht 5' 4.5" (1.638 m)   Wt 237 lb (107.5 kg)   BMI 40.05 kg/m  GENERAL: The patient is AO x3, in no acute distress. Obese. HEENT: Head is normocephalic and atraumatic. EOMI are intact. Mouth is well hydrated and without lesions. NECK: Supple. No masses LUNGS: Clear to auscultation. No presence of rhonchi/wheezing/rales. Adequate chest expansion HEART: RRR, normal s1 and s2. ABDOMEN: is mildly distended, has some discomfort upon palpation of the upper abdominal area, no guarding, no peritoneal signs, and nondistended. BS +. Ventral hernia. Has multiple adbominal scars. EXTREMITIES: Without any cyanosis, clubbing, rash, lesions or edema. NEUROLOGIC: AOx3, no focal motor deficit. SKIN: no jaundice, no rashes   Imaging/Labs: as above  I  personally reviewed and interpreted the available labs, imaging and endoscopic files.  Impression and Plan: [name] is a 60 y.o. female with PMH Paget's disease breast cancer s/p mastectomy, PE in 1997 on Xarelto and IVC filter placement s/p partial removal, anxiety, PSVT status post ablation,History of prior stroke, fibromyalgia, ovarian cyst status post BSO, complicated surgical history of partial colectomy due to redundant colon with primary anastomosis, perforated bowel due to gynecologic procedure complicated by enterocutaneous fistula that required multiple surgical procedures and bear claw placement complicated by recurrent episodes of SBO, who presents for evaluation of dysphagia, retrosternal chest pain, bloating, hoarseness/choking, constipation.  Regarding her chest pain and episode of hoarseness, explained to patient that the symptoms could be related to her GERD however she may also be presenting some esophageal dysmotility leading  to her current presentation, as she has also presented some episodes of dysphagia.  It is important to note that she is currently taking Dilaudid which can cause motility abnormalities such as distal esophageal spasm or IEM.  For now, we will investigate this further with an EGD with possible esophageal biopsies and dilation.  Small bowel biopsy will be obtained at that time.  She has been taking Flovent discomfort.  Given her history of previous surgeries, I consider it appropriate to rule out small intestinal bacterial overgrowth.  However the patient was given a dietary list as this could also be related to bowel hypersensitivity in the setting of prior surgeries and chronic opiate intake.  If daily is unrevealing, will discuss whether it is adequate to perform esophageal manometry and pH impedance testing while on opiates.  She is not interested in starting any new medications as she is trying to cut down the list of medicines that she is currently taking.  I think this is adequate.  She has also brings in an episode of rectal bleeding, likely related to the amount of bowel movements she is presenting.  She is due for colorectal cancer screening, for which I will schedule a colonoscopy.  I advised her to attempt to increase her laxative use to once a day and determine if this leads to "normalization" of her abdominal bowel movements.  Patient understood and agreed  -Schedule EGD and colonoscopy. - Referral to [facility] for breath test for SIBO -Explained presumed etiology of possible IBS symptoms. Patient was counseled about the benefit of implementing a low FODMAP to improve symptoms and recurrent episodes. A dietary list was provided to the patient. - Will consider esopphageal manometry and ph  Impedance testing on medication and while on opiates based on EGD findings   All questions were answered.      Maylon Peppers, MD Gastroenterology and Hepatology [facility]

## 2021-04-18 ENCOUNTER — Other Ambulatory Visit: Payer: Self-pay | Admitting: Internal Medicine

## 2021-04-18 DIAGNOSIS — Z86711 Personal history of pulmonary embolism: Secondary | ICD-10-CM

## 2021-04-29 NOTE — Patient Instructions (Signed)
[name]  04/29/2021     @PREFPERIOPPHARMACY @   Your procedure is scheduled on  05/02/2021.   Report to [facility] at  1105  A.M.   Call this number if you have problems the morning of surgery:  [phone]   Remember:  Follow the diet and prep instructions given to you by the office.                        Take these medicines the morning of surgery with A SIP OF WATER  Xanax(if needed), Fioricet (if needed), flexeril (if needed), cymbalta, dilaudid (if needed), levothyroxine, antivert (if needed), metoprolol, prilosec.   Your last dose of phenteremine should have been 4/28.  Your last dose of eliquis should be 5/3.      Please brush your teeth.  Do not wear jewelry, make-up or nail polish.  Do not wear lotions, powders, or perfumes, or deodorant.  Do not shave 48 hours prior to surgery.  Men may shave face and neck.  Do not bring valuables to the hospital.  [facility] is not responsible for any belongings or valuables.  Contacts, dentures or bridgework may not be worn into surgery.  Leave your suitcase in the car.  After surgery it may be brought to your room.  For patients admitted to the hospital, discharge time will be determined by your treatment team.  Patients discharged the day of surgery will not be allowed to drive home and must have someone with them for 24 hours.   Special instructions:  DO NOT smoke tobacco or vape for 24 hours before your procedure.  Please read over the following fact sheets that you were given. Anesthesia Post-op Instructions and Care and Recovery After Surgery       Upper Endoscopy, Adult, Care After This sheet gives you information about how to care for yourself after your procedure. Your health care provider may also give you more specific instructions. If you have problems or questions, contact your health care provider. What can I expect after the procedure? After the procedure, it is common to  have:  A sore throat.  Mild stomach pain or discomfort.  Bloating.  Nausea. Follow these instructions at home:  Follow instructions from your health care provider about what to eat or drink after your procedure.  Return to your normal activities as told by your health care provider. Ask your health care provider what activities are safe for you.  Take over-the-counter and prescription medicines only as told by your health care provider.  If you were given a sedative during the procedure, it can affect you for several hours. Do not drive or operate machinery until your health care provider says that it is safe.  Keep all follow-up visits as told by your health care provider. This is important.   Contact a health care provider if you have:  A sore throat that lasts longer than one day.  Trouble swallowing. Get help right away if:  You vomit blood or your vomit looks like coffee grounds.  You have: ? A fever. ? Bloody, black, or tarry stools. ? A severe sore throat or you cannot swallow. ? Difficulty breathing. ? Severe pain in your chest or abdomen. Summary  After the procedure, it is common to have a sore throat, mild stomach discomfort, bloating, and nausea.  If you were given a sedative during the procedure, it can affect you  for several hours. Do not drive or operate machinery until your health care provider says that it is safe.  Follow instructions from your health care provider about what to eat or drink after your procedure.  Return to your normal activities as told by your health care provider. This information is not intended to replace advice given to you by your health care provider. Make sure you discuss any questions you have with your health care provider. Document Revised: 12/12/2019 Document Reviewed: 05/16/2018 Elsevier Patient Education  2021 [place].  Colonoscopy, Adult, Care After This sheet gives you information about how to care for yourself  after your procedure. Your health care provider may also give you more specific instructions. If you have problems or questions, contact your health care provider. What can I expect after the procedure? After the procedure, it is common to have:  A small amount of blood in your stool for 24 hours after the procedure.  Some gas.  Mild cramping or bloating of your abdomen. Follow these instructions at home: Eating and drinking  Drink enough fluid to keep your urine pale yellow.  Follow instructions from your health care provider about eating or drinking restrictions.  Resume your normal diet as instructed by your health care provider. Avoid heavy or fried foods that are hard to digest.   Activity  Rest as told by your health care provider.  Avoid sitting for a long time without moving. Get up to take short walks every 1-2 hours. This is important to improve blood flow and breathing. Ask for help if you feel weak or unsteady.  Return to your normal activities as told by your health care provider. Ask your health care provider what activities are safe for you. Managing cramping and bloating  Try walking around when you have cramps or feel bloated.  Apply heat to your abdomen as told by your health care provider. Use the heat source that your health care provider recommends, such as a moist heat pack or a heating pad. ? Place a towel between your skin and the heat source. ? Leave the heat on for 20-30 minutes. ? Remove the heat if your skin turns bright red. This is especially important if you are unable to feel pain, heat, or cold. You may have a greater risk of getting burned.   General instructions  If you were given a sedative during the procedure, it can affect you for several hours. Do not drive or operate machinery until your health care provider says that it is safe.  For the first 24 hours after the procedure: ? Do not sign important documents. ? Do not drink alcohol. ? Do  your regular daily activities at a slower pace than normal. ? Eat soft foods that are easy to digest.  Take over-the-counter and prescription medicines only as told by your health care provider.  Keep all follow-up visits as told by your health care provider. This is important. Contact a health care provider if:  You have blood in your stool 2-3 days after the procedure. Get help right away if you have:  More than a small spotting of blood in your stool.  Large blood clots in your stool.  Swelling of your abdomen.  Nausea or vomiting.  A fever.  Increasing pain in your abdomen that is not relieved with medicine. Summary  After the procedure, it is common to have a small amount of blood in your stool. You may also have mild cramping and bloating  of your abdomen.  If you were given a sedative during the procedure, it can affect you for several hours. Do not drive or operate machinery until your health care provider says that it is safe.  Get help right away if you have a lot of blood in your stool, nausea or vomiting, a fever, or increased pain in your abdomen. This information is not intended to replace advice given to you by your health care provider. Make sure you discuss any questions you have with your health care provider. Document Revised: 12/08/2019 Document Reviewed: 07/10/2019 Elsevier Patient Education  2021 [place] After This sheet gives you information about how to care for yourself after your procedure. Your health care provider may also give you more specific instructions. If you have problems or questions, contact your health care provider. What can I expect after the procedure? After the procedure, it is common to have:  Tiredness.  Forgetfulness about what happened after the procedure.  Impaired judgment for important decisions.  Nausea or vomiting.  Some difficulty with balance. Follow these instructions at  home: For the time period you were told by your health care provider:  Rest as needed.  Do not participate in activities where you could fall or become injured.  Do not drive or use machinery.  Do not drink alcohol.  Do not take sleeping pills or medicines that cause drowsiness.  Do not make important decisions or sign legal documents.  Do not take care of children on your own.      Eating and drinking  Follow the diet that is recommended by your health care provider.  Drink enough fluid to keep your urine pale yellow.  If you vomit: ? Drink water, juice, or soup when you can drink without vomiting. ? Make sure you have little or no nausea before eating solid foods. General instructions  Have a responsible adult stay with you for the time you are told. It is important to have someone help care for you until you are awake and alert.  Take over-the-counter and prescription medicines only as told by your health care provider.  If you have sleep apnea, surgery and certain medicines can increase your risk for breathing problems. Follow instructions from your health care provider about wearing your sleep device: ? Anytime you are sleeping, including during daytime naps. ? While taking prescription pain medicines, sleeping medicines, or medicines that make you drowsy.  Avoid smoking.  Keep all follow-up visits as told by your health care provider. This is important. Contact a health care provider if:  You keep feeling nauseous or you keep vomiting.  You feel light-headed.  You are still sleepy or having trouble with balance after 24 hours.  You develop a rash.  You have a fever.  You have redness or swelling around the IV site. Get help right away if:  You have trouble breathing.  You have new-onset confusion at home. Summary  For several hours after your procedure, you may feel tired. You may also be forgetful and have poor judgment.  Have a responsible adult  stay with you for the time you are told. It is important to have someone help care for you until you are awake and alert.  Rest as told. Do not drive or operate machinery. Do not drink alcohol or take sleeping pills.  Get help right away if you have trouble breathing, or if you suddenly become confused. This information is not intended to replace  advice given to you by your health care provider. Make sure you discuss any questions you have with your health care provider. Document Revised: 08/29/2020 Document Reviewed: 11/16/2019 Elsevier Patient Education  2021 [employer].

## 2021-04-30 ENCOUNTER — Other Ambulatory Visit (HOSPITAL_COMMUNITY)
Admission: RE | Admit: 2021-04-30 | Discharge: 2021-04-30 | Disposition: A | Discharge status: Home / Self Care | Payer: Medicare Other | Source: Ambulatory Visit | Attending: Gastroenterology | Admitting: Gastroenterology

## 2021-04-30 ENCOUNTER — Encounter (HOSPITAL_COMMUNITY)
Admission: RE | Admit: 2021-04-30 | Discharge: 2021-04-30 | Disposition: A | Discharge status: Home / Self Care | Payer: Medicare Other | Source: Ambulatory Visit | Attending: Gastroenterology | Admitting: Gastroenterology

## 2021-04-30 ENCOUNTER — Encounter (HOSPITAL_COMMUNITY): Payer: Self-pay

## 2021-04-30 ENCOUNTER — Other Ambulatory Visit: Payer: Self-pay

## 2021-04-30 DIAGNOSIS — Z20822 Contact with and (suspected) exposure to covid-19: Secondary | ICD-10-CM | POA: Diagnosis not present

## 2021-04-30 DIAGNOSIS — Z01812 Encounter for preprocedural laboratory examination: Secondary | ICD-10-CM | POA: Diagnosis present

## 2021-04-30 HISTORY — DX: Failed or difficult intubation, initial encounter: T88.4XXA

## 2021-04-30 LAB — CBC WITH DIFFERENTIAL/PLATELET
Abs Immature Granulocytes: 0.03 10*3/uL (ref 0.00–0.07)
Basophils Absolute: 0 10*3/uL (ref 0.0–0.1)
Basophils Relative: 1 %
Eosinophils Absolute: 0.1 10*3/uL (ref 0.0–0.5)
Eosinophils Relative: 1 %
HCT: 39.1 % (ref 36.0–46.0)
Hemoglobin: 13.1 g/dL (ref 12.0–15.0)
Immature Granulocytes: 0 %
Lymphocytes Relative: 30 %
Lymphs Abs: 2.1 10*3/uL (ref 0.7–4.0)
MCH: 30.6 pg (ref 26.0–34.0)
MCHC: 33.5 g/dL (ref 30.0–36.0)
MCV: 91.4 fL (ref 80.0–100.0)
Monocytes Absolute: 0.4 10*3/uL (ref 0.1–1.0)
Monocytes Relative: 6 %
Neutro Abs: 4.3 10*3/uL (ref 1.7–7.7)
Neutrophils Relative %: 62 %
Platelets: 135 10*3/uL — ABNORMAL LOW (ref 150–400)
RBC: 4.28 MIL/uL (ref 3.87–5.11)
RDW: 14.3 % (ref 11.5–15.5)
WBC: 7 10*3/uL (ref 4.0–10.5)
nRBC: 0 % (ref 0.0–0.2)

## 2021-04-30 LAB — COMPREHENSIVE METABOLIC PANEL
ALT: 24 U/L (ref 0–44)
AST: 23 U/L (ref 15–41)
Albumin: 3.9 g/dL (ref 3.5–5.0)
Alkaline Phosphatase: 97 U/L (ref 38–126)
Anion gap: 8 (ref 5–15)
BUN: 17 mg/dL (ref 6–20)
CO2: 28 mmol/L (ref 22–32)
Calcium: 8.9 mg/dL (ref 8.9–10.3)
Chloride: 104 mmol/L (ref 98–111)
Creatinine, Ser: 0.82 mg/dL (ref 0.44–1.00)
GFR, Estimated: >60 mL/min (ref >60)
Glucose, Bld: 102 mg/dL — ABNORMAL HIGH (ref 70–99)
Potassium: 3.7 mmol/L (ref 3.5–5.1)
Sodium: 140 mmol/L (ref 135–145)
Total Bilirubin: 0.6 mg/dL (ref 0.3–1.2)
Total Protein: 6.4 g/dL — ABNORMAL LOW (ref 6.5–8.1)

## 2021-04-30 LAB — SARS CORONAVIRUS 2 (TAT 6-24 HRS): SARS Coronavirus 2: NEGATIVE

## 2021-05-02 ENCOUNTER — Telehealth (INDEPENDENT_AMBULATORY_CARE_PROVIDER_SITE_OTHER): Payer: Self-pay | Admitting: *Deleted

## 2021-05-02 ENCOUNTER — Encounter (HOSPITAL_COMMUNITY): Payer: Self-pay | Admitting: Gastroenterology

## 2021-05-02 ENCOUNTER — Ambulatory Visit (HOSPITAL_COMMUNITY)
Admission: RE | Admit: 2021-05-02 | Discharge: 2021-05-02 | Disposition: A | Discharge status: Home / Self Care | Payer: Medicare Other | Attending: Gastroenterology | Admitting: Gastroenterology

## 2021-05-02 ENCOUNTER — Encounter (HOSPITAL_COMMUNITY)
Admission: RE | Disposition: A | Discharge status: Home / Self Care | Payer: Self-pay | Source: Home / Self Care | Attending: Gastroenterology

## 2021-05-02 ENCOUNTER — Ambulatory Visit: Payer: Medicare Other

## 2021-05-02 ENCOUNTER — Other Ambulatory Visit: Payer: Self-pay

## 2021-05-02 ENCOUNTER — Ambulatory Visit (HOSPITAL_COMMUNITY): Payer: Medicare Other | Admitting: Anesthesiology

## 2021-05-02 DIAGNOSIS — Z8041 Family history of malignant neoplasm of ovary: Secondary | ICD-10-CM | POA: Insufficient documentation

## 2021-05-02 DIAGNOSIS — K6389 Other specified diseases of intestine: Secondary | ICD-10-CM | POA: Diagnosis not present

## 2021-05-02 DIAGNOSIS — Z90722 Acquired absence of ovaries, bilateral: Secondary | ICD-10-CM | POA: Diagnosis not present

## 2021-05-02 DIAGNOSIS — Z8042 Family history of malignant neoplasm of prostate: Secondary | ICD-10-CM | POA: Insufficient documentation

## 2021-05-02 DIAGNOSIS — Z7901 Long term (current) use of anticoagulants: Secondary | ICD-10-CM | POA: Insufficient documentation

## 2021-05-02 DIAGNOSIS — Z9049 Acquired absence of other specified parts of digestive tract: Secondary | ICD-10-CM | POA: Insufficient documentation

## 2021-05-02 DIAGNOSIS — Z980 Intestinal bypass and anastomosis status: Secondary | ICD-10-CM | POA: Diagnosis not present

## 2021-05-02 DIAGNOSIS — Z853 Personal history of malignant neoplasm of breast: Secondary | ICD-10-CM | POA: Insufficient documentation

## 2021-05-02 DIAGNOSIS — Z7982 Long term (current) use of aspirin: Secondary | ICD-10-CM | POA: Insufficient documentation

## 2021-05-02 DIAGNOSIS — R0789 Other chest pain: Secondary | ICD-10-CM | POA: Diagnosis not present

## 2021-05-02 DIAGNOSIS — F419 Anxiety disorder, unspecified: Secondary | ICD-10-CM | POA: Diagnosis not present

## 2021-05-02 DIAGNOSIS — Z9079 Acquired absence of other genital organ(s): Secondary | ICD-10-CM | POA: Insufficient documentation

## 2021-05-02 DIAGNOSIS — Z881 Allergy status to other antibiotic agents status: Secondary | ICD-10-CM | POA: Insufficient documentation

## 2021-05-02 DIAGNOSIS — Z800 Family history of malignant neoplasm of digestive organs: Secondary | ICD-10-CM | POA: Insufficient documentation

## 2021-05-02 DIAGNOSIS — Z79899 Other long term (current) drug therapy: Secondary | ICD-10-CM | POA: Diagnosis not present

## 2021-05-02 DIAGNOSIS — Z8673 Personal history of transient ischemic attack (TIA), and cerebral infarction without residual deficits: Secondary | ICD-10-CM | POA: Diagnosis not present

## 2021-05-02 DIAGNOSIS — R490 Dysphonia: Secondary | ICD-10-CM | POA: Insufficient documentation

## 2021-05-02 DIAGNOSIS — Z79891 Long term (current) use of opiate analgesic: Secondary | ICD-10-CM | POA: Diagnosis not present

## 2021-05-02 DIAGNOSIS — Z86711 Personal history of pulmonary embolism: Secondary | ICD-10-CM | POA: Insufficient documentation

## 2021-05-02 DIAGNOSIS — R1310 Dysphagia, unspecified: Secondary | ICD-10-CM

## 2021-05-02 DIAGNOSIS — Z79890 Hormone replacement therapy (postmenopausal): Secondary | ICD-10-CM | POA: Insufficient documentation

## 2021-05-02 DIAGNOSIS — Z885 Allergy status to narcotic agent status: Secondary | ICD-10-CM | POA: Insufficient documentation

## 2021-05-02 DIAGNOSIS — Z9010 Acquired absence of unspecified breast and nipple: Secondary | ICD-10-CM | POA: Insufficient documentation

## 2021-05-02 DIAGNOSIS — K625 Hemorrhage of anus and rectum: Secondary | ICD-10-CM | POA: Diagnosis present

## 2021-05-02 DIAGNOSIS — Z803 Family history of malignant neoplasm of breast: Secondary | ICD-10-CM | POA: Insufficient documentation

## 2021-05-02 DIAGNOSIS — Z808 Family history of malignant neoplasm of other organs or systems: Secondary | ICD-10-CM | POA: Insufficient documentation

## 2021-05-02 DIAGNOSIS — K648 Other hemorrhoids: Secondary | ICD-10-CM | POA: Diagnosis not present

## 2021-05-02 DIAGNOSIS — M797 Fibromyalgia: Secondary | ICD-10-CM | POA: Insufficient documentation

## 2021-05-02 DIAGNOSIS — Z888 Allergy status to other drugs, medicaments and biological substances status: Secondary | ICD-10-CM | POA: Insufficient documentation

## 2021-05-02 DIAGNOSIS — K2289 Other specified disease of esophagus: Secondary | ICD-10-CM | POA: Diagnosis not present

## 2021-05-02 DIAGNOSIS — K317 Polyp of stomach and duodenum: Secondary | ICD-10-CM | POA: Insufficient documentation

## 2021-05-02 DIAGNOSIS — K5900 Constipation, unspecified: Secondary | ICD-10-CM | POA: Insufficient documentation

## 2021-05-02 DIAGNOSIS — R140 Abdominal distension (gaseous): Secondary | ICD-10-CM | POA: Diagnosis not present

## 2021-05-02 HISTORY — PX: ESOPHAGOGASTRODUODENOSCOPY (EGD) WITH PROPOFOL: SHX5813

## 2021-05-02 HISTORY — PX: POLYPECTOMY: SHX5525

## 2021-05-02 HISTORY — PX: BIOPSY: SHX5522

## 2021-05-02 HISTORY — PX: SAVORY DILATION: SHX5439

## 2021-05-02 HISTORY — PX: COLONOSCOPY WITH PROPOFOL: SHX5780

## 2021-05-02 LAB — HM COLONOSCOPY

## 2021-05-02 SURGERY — COLONOSCOPY WITH PROPOFOL
Anesthesia: General

## 2021-05-02 MED ORDER — ONDANSETRON HCL 4 MG/2ML IJ SOLN
INTRAMUSCULAR | Status: AC
  Filled 2021-05-02: qty 2

## 2021-05-02 MED ORDER — LIDOCAINE VISCOUS HCL 2 % MT SOLN
OROMUCOSAL | Status: AC
  Filled 2021-05-02: qty 15

## 2021-05-02 MED ORDER — LIDOCAINE VISCOUS HCL 2 % MT SOLN
15.0000 mL | Freq: Once | OROMUCOSAL | Status: AC
  Administered 2021-05-02: 15 mL via OROMUCOSAL

## 2021-05-02 MED ORDER — PROPOFOL 10 MG/ML IV BOLUS
INTRAVENOUS | Status: AC
  Filled 2021-05-02: qty 80

## 2021-05-02 MED ORDER — ONDANSETRON HCL 4 MG/2ML IJ SOLN
4.0000 mg | Freq: Once | INTRAMUSCULAR | Status: AC
  Administered 2021-05-02: 4 mg via INTRAVENOUS

## 2021-05-02 MED ORDER — STERILE WATER FOR IRRIGATION IR SOLN
Status: DC | PRN
  Administered 2021-05-02: 200 mL

## 2021-05-02 MED ORDER — PROPOFOL 10 MG/ML IV BOLUS
INTRAVENOUS | Status: DC | PRN
  Administered 2021-05-02: 20 mg via INTRAVENOUS
  Administered 2021-05-02: 50 mg via INTRAVENOUS
  Administered 2021-05-02: 20 mg via INTRAVENOUS
  Administered 2021-05-02: 45 mg via INTRAVENOUS
  Administered 2021-05-02: 5 mg via INTRAVENOUS

## 2021-05-02 MED ORDER — LACTATED RINGERS IV SOLN: INTRAVENOUS | Status: DC

## 2021-05-02 MED ORDER — KETAMINE HCL 50 MG/5ML IJ SOSY
PREFILLED_SYRINGE | INTRAMUSCULAR | Status: AC
  Filled 2021-05-02: qty 5

## 2021-05-02 MED ORDER — PROPOFOL 500 MG/50ML IV EMUL
INTRAVENOUS | Status: DC | PRN
  Administered 2021-05-02: 150 ug/kg/min via INTRAVENOUS

## 2021-05-02 NOTE — Transfer of Care (Signed)
Immediate Anesthesia Transfer of Care Note  Patient: [name]  Procedure(s) Performed: COLONOSCOPY WITH PROPOFOL (N/A ) ESOPHAGOGASTRODUODENOSCOPY (EGD) WITH PROPOFOL (N/A ) SAVORY DILATION POLYPECTOMY BIOPSY  Patient Location: Short Stay  Anesthesia Type:General  Level of Consciousness: awake, alert  and oriented  Airway & Oxygen Therapy: Patient Spontanous Breathing  Post-op Assessment: Report given to RN, Post -op Vital signs reviewed and stable and Patient moving all extremities X 4  Post vital signs: Reviewed and stable  Last Vitals:  Vitals Value Taken Time  BP    Temp    Pulse    Resp    SpO2      Last Pain:  Vitals:   05/02/21 1341  TempSrc:   PainSc: 0-No pain         Complications: No complications documented.

## 2021-05-02 NOTE — Anesthesia Postprocedure Evaluation (Signed)
Anesthesia Post Note  Patient: [name]  Procedure(s) Performed: COLONOSCOPY WITH PROPOFOL (N/A ) ESOPHAGOGASTRODUODENOSCOPY (EGD) WITH PROPOFOL (N/A ) SAVORY DILATION POLYPECTOMY BIOPSY  Patient location during evaluation: Phase II Anesthesia Type: General Level of consciousness: awake and alert and oriented Pain management: pain level controlled Vital Signs Assessment: post-procedure vital signs reviewed and stable Respiratory status: spontaneous breathing and respiratory function stable Cardiovascular status: blood pressure returned to baseline and stable Postop Assessment: no apparent nausea or vomiting Anesthetic complications: no   No complications documented.   Last Vitals:  Vitals:   05/02/21 1254 05/02/21 1435  BP: 120/81 107/76  Pulse: 85 87  Resp: 16 17  Temp: 36.9 C 37 C  SpO2: 98% 100%    Last Pain:  Vitals:   05/02/21 1435  TempSrc: Oral  PainSc:                  Dreamer Carillo C Ayrton Mcvay

## 2021-05-02 NOTE — Op Note (Signed)
[facility] Patient Name: [name] Procedure Date: 05/02/2021 11:39 AM MRN: [id number] Date of Birth: May 30, 1961 Attending MD: Maylon Peppers ,  CSN: [id number] Age: 60 Admit Type: Outpatient Procedure:                Upper GI endoscopy Indications:              Dysphagia, Abdominal bloating, Chest pain (non                            cardiac) Providers:                Maylon Peppers, Lambert Mody, Casimer Bilis, Technician Referring MD:              Medicines:                Monitored Anesthesia Care Complications:            No immediate complications. Estimated Blood Loss:     Estimated blood loss: none. Procedure:                Pre-Anesthesia Assessment:                           - Prior to the procedure, a History and Physical                            was performed, and patient medications, allergies                            and sensitivities were reviewed. [name]                            tolerance of previous anesthesia was reviewed.                           After obtaining informed consent, the endoscope was                            passed under direct vision. Throughout the                            procedure, the patient's blood pressure, pulse, and                            oxygen saturations were monitored continuously.The                            upper GI endoscopy was accomplished without                            difficulty. The patient tolerated the procedure                            well. The GIF-H190 ([id number]) scope was introduced  through the mouth, and advanced to the second part                            of duodenum. Scope In: 1:46:27 PM Scope Out: 2:02:03 PM Total Procedure Duration: 0 hours 15 minutes 36 seconds  Findings:      No endoscopic abnormality was evident in the esophagus to explain the       patient's complaint of dysphagia. There was only  presence of abnormal       esophageal peristaltic waves and foam in the esophageal lumen. It was       decided, however, to proceed with dilation of the entire esophagus. A       guidewire was placed and the scope was withdrawn. Dilation was performed       with a Savary dilator with no resistance at 18 mm. No mucosal disruption       was seen upon reinspection. Biopsies were obtained from the proximal and       distal esophagus with cold forceps for histology of eosinophilic       esophagitis.      A single 5 mm sessile polyp with no bleeding was found in the gastric       body. The polyp was removed with a hot snare. Resection and retrieval       were complete.      The examined duodenum was normal. Biopsies were taken with a cold       forceps for histology. Impression:               - No endoscopic esophageal abnormality to explain                            patient's dysphagia. Esophagus dilated. Dilated.                            Biopsied.                           - A single gastric polyp. Resected and retrieved.                           - Normal examined duodenum. Biopsied. Moderate Sedation:      Per Anesthesia Care Recommendation:           - Discharge patient to home (ambulatory).                           - Resume previous diet.                           - Await pathology results.                           - Will consider esophageal manometry if persistent                            chest discomfort in next 2 weeks. Procedure Code(s):        --- Professional ---  21117, Esophagogastroduodenoscopy, flexible,                            transoral; with removal of tumor(s), polyp(s), or                            other lesion(s) by snare technique                           43248, Esophagogastroduodenoscopy, flexible,                            transoral; with insertion of guide wire followed by                            passage of dilator(s) through  esophagus over guide                            wire                           43239, 59, Esophagogastroduodenoscopy, flexible,                            transoral; with biopsy, single or multiple Diagnosis Code(s):        --- Professional ---                           R13.10, Dysphagia, unspecified                           K31.7, Polyp of stomach and duodenum                           R14.0, Abdominal distension (gaseous)                           R07.89, Other chest pain CPT copyright 2019 American Medical Association. All rights reserved. The codes documented in this report are preliminary and upon coder review may  be revised to meet current compliance requirements. Maylon Peppers, MD Maylon Peppers,  05/02/2021 2:28:56 PM This report has been signed electronically. Number of Addenda: 0

## 2021-05-02 NOTE — Op Note (Signed)
[facility] Patient Name: [name] Procedure Date: 05/02/2021 11:41 AM MRN: [id number] Date of Birth: [birth year] Attending MD: Maylon Peppers ,  CSN: [id number] Age: 60 Admit Type: Outpatient Procedure:                Colonoscopy Indications:              Rectal bleeding Providers:                Maylon Peppers Referring MD:              Medicines:                Monitored Anesthesia Care Complications:            No immediate complications. Estimated Blood Loss:     Estimated blood loss: none. Procedure:                Pre-Anesthesia Assessment:                           - The risks and benefits of the procedure and the                            sedation options and risks were discussed with the                            patient. All questions were answered and informed                            consent was obtained.                           - ASA Grade Assessment: III - A patient with severe                            systemic disease.                           - Prior to the procedure, a History and Physical                            was performed, and patient medications, allergies                            and sensitivities were reviewed. [name]                            tolerance of previous anesthesia was reviewed.                           After obtaining informed consent, the colonoscope                            was passed under direct vision. Throughout the                            procedure, the patient's blood pressure, pulse, and  oxygen saturations were monitored continuously.The                            colonoscopy was performed without difficulty. The                            patient tolerated the procedure well. The quality                            of the bowel preparation was fair. The PCF-H190DL                            ([id number]) scope was introduced through the anus and                             advanced to the the terminal ileum. Scope In: 2:09:55 PM Scope Out: 2:26:53 PM Scope Withdrawal Time: 0 hours 10 minutes 22 seconds  Total Procedure Duration: 0 hours 16 minutes 58 seconds  Findings:      The perianal and digital rectal examinations were normal.      The terminal ileum appeared normal.      There was evidence of a prior side-to-side ileo-colonic anastomosis at       50 cm proximal to the anus. This was patent and was characterized by       healthy appearing mucosa. Overall, there was significant "whirlwind"       shape torsioning of the anastomosis. There was a suture and clips at the       anastomosis, I tired to snare it with cold nsare but this could not be       achieved. Did not attempt hot snare due to presence of stool. The       anastomosis was traversed.      A moderate amount of semi-liquid stool was found in the entire colon,       interfering with visualization. Lavage of the area was performed using a       large amount, resulting in clearance with adequate visualization. No       polyps were observed throughout the colonic mucosa. Biopsies for       histology were taken with a cold forceps from the left colon for       evaluation of microscopic colitis.      Non-bleeding internal hemorrhoids were found during retroflexion. The       hemorrhoids were small. Impression:               - Preparation of the colon was fair, improved with                            lavage.                           - The examined portion of the ileum was normal.                           - Patent side-to-side ileo-colonic anastomosis,  characterized by healthy appearing mucosa.                            Torsioned anastomosis.                           - Stool in the entire examined colon. Normal mucosa                            biopsied.                           - Non-bleeding internal hemorrhoids. Moderate Sedation:      Per Anesthesia  Care Recommendation:           - Discharge patient to home (ambulatory).                           - Resume previous diet. Procedure Code(s):        --- Professional ---                           [phone], Colonoscopy, flexible; with biopsy, single                            or multiple Diagnosis Code(s):        --- Professional ---                           K64.8, Other hemorrhoids                           Z98.0, Intestinal bypass and anastomosis status                           K62.5, Hemorrhage of anus and rectum CPT copyright 2019 American Medical Association. All rights reserved. The codes documented in this report are preliminary and upon coder review may  be revised to meet current compliance requirements. Maylon Peppers, MD Maylon Peppers,  05/02/2021 2:40:20 PM This report has been signed electronically. Number of Addenda: 0

## 2021-05-02 NOTE — Telephone Encounter (Signed)
Spoke to scheduler to day - they have cancelled referral - patient hasn't returned any of their phone calls

## 2021-05-02 NOTE — Anesthesia Preprocedure Evaluation (Signed)
Anesthesia Evaluation  Patient identified by MRN, date of birth, ID band Patient awake    Reviewed: Allergy & Precautions, NPO status , Patient's Chart, lab work & pertinent test results  History of Anesthesia Complications (+) PONV, DIFFICULT AIRWAY, Family history of anesthesia reaction and history of anesthetic complications  Airway Mallampati: III  TM Distance: >3 FB Neck ROM: Full    Dental  (+) Dental Advisory Given, Teeth Intact   Pulmonary PE   Pulmonary exam normal breath sounds clear to auscultation       Cardiovascular Exercise Tolerance: Poor hypertension, Pt. on medications + angina Normal cardiovascular exam+ dysrhythmias Supra Ventricular Tachycardia  Rhythm:Regular Rate:Normal     Neuro/Psych  Headaches, PSYCHIATRIC DISORDERS Anxiety Depression TIA Neuromuscular disease CVA    GI/Hepatic GERD  Medicated and Controlled,EXP LAP, Small bowel ressection   Endo/Other  Hypothyroidism   Renal/GU Renal disease     Musculoskeletal  (+) Arthritis  (ACDF), Fibromyalgia -  Abdominal   Peds  Hematology  (+) anemia ,   Anesthesia Other Findings   Reproductive/Obstetrics                             Anesthesia Physical Anesthesia Plan  ASA: III  Anesthesia Plan: General   Post-op Pain Management:    Induction: Intravenous  PONV Risk Score and Plan: Propofol infusion  Airway Management Planned: Nasal Cannula and Natural Airway  Additional Equipment:   Intra-op Plan:   Post-operative Plan:   Informed Consent: I have reviewed the patients History and Physical, chart, labs and discussed the procedure including the risks, benefits and alternatives for the proposed anesthesia with the patient or authorized representative who has indicated his/her understanding and acceptance.     Dental advisory given  Plan Discussed with: CRNA and Surgeon  Anesthesia Plan Comments:          Anesthesia Quick Evaluation

## 2021-05-02 NOTE — Interval H&P Note (Signed)
History and Physical Interval Note:  05/02/2021 12:56 PM [name] is a 60 y.o. female with PMH Paget's disease breast cancer s/p mastectomy, PE in 1997 on Xarelto and IVC filter placement s/p partial removal, anxiety, PSVT status post ablation, history of prior stroke, fibromyalgia, ovarian cyst status post BSO, complicated surgical history of partial colectomy due to redundant colon with primary anastomosis, perforated bowel due to gynecologic procedure complicated by enterocutaneous fistula that required multiple surgical procedures and bear claw placement complicated by recurrent episodes of SBO, who presents for evaluation of dysphagia, retrosternal chest pain, and rectal bleeding.  Patient reports that she has presented intermittent episodes of chest pain with last episodes yesterday.  Has presented occasional episodes of dysphagia.  She reports that she had occasional rectal bleeding but this has been infrequent.  Has been chronically taking opiates.  BP 120/81   Pulse 85   Temp 98.5 F (36.9 C) (Oral)   Resp 16   SpO2 98%  GENERAL: The patient is AO x3, in no acute distress. Obese. HEENT: Head is normocephalic and atraumatic. EOMI are intact. Mouth is well hydrated and without lesions. NECK: Supple. No masses LUNGS: Clear to auscultation. No presence of rhonchi/wheezing/rales. Adequate chest expansion HEART: RRR, normal s1 and s2. ABDOMEN: Soft, nontender, no guarding, no peritoneal signs, and nondistended. BS +. No masses. EXTREMITIES: Without any cyanosis, clubbing, rash, lesions or edema. NEUROLOGIC: AOx3, no focal motor deficit. SKIN: no jaundice, no rashes   Oswin Johal  has presented today for surgery, with the diagnosis of Screening Colonoscopy chest pain Bloating.  The various methods of treatment have been discussed with the patient and family. After consideration of risks, benefits and other options for treatment, the patient has consented to   Procedure(s) with comments: COLONOSCOPY WITH PROPOFOL (N/A) - 12:30 PM ESOPHAGOGASTRODUODENOSCOPY (EGD) WITH PROPOFOL (N/A) as a surgical intervention.  [name] history has been reviewed, patient examined, no change in status, stable for surgery.  I have reviewed the patient's chart and labs.  Questions were answered to the patient's satisfaction.     Maylon Peppers Mayorga

## 2021-05-02 NOTE — Discharge Instructions (Signed)
You are being discharged to home.  Resume your previous diet.  We are waiting for your pathology results.  Will consider esophageal manometry if persistent chest discomfort in next 2 weeks.  Restart Xarelto today. Your physician has recommended a repeat colonoscopy in 10 years for screening purposes, will need a two day prep.    Monitored Anesthesia Care, Care After This sheet gives you information about how to care for yourself after your procedure. Your health care provider may also give you more specific instructions. If you have problems or questions, contact your health care provider. What can I expect after the procedure? After the procedure, it is common to have:  Tiredness.  Forgetfulness about what happened after the procedure.  Impaired judgment for important decisions.  Nausea or vomiting.  Some difficulty with balance. Follow these instructions at home: For the time period you were told by your health care provider:  Rest as needed.  Do not participate in activities where you could fall or become injured.  Do not drive or use machinery.  Do not drink alcohol.  Do not take sleeping pills or medicines that cause drowsiness.  Do not make important decisions or sign legal documents.  Do not take care of children on your own.      Eating and drinking  Follow the diet that is recommended by your health care provider.  Drink enough fluid to keep your urine pale yellow.  If you vomit: ? Drink water, juice, or soup when you can drink without vomiting. ? Make sure you have little or no nausea before eating solid foods. General instructions  Have a responsible adult stay with you for the time you are told. It is important to have someone help care for you until you are awake and alert.  Take over-the-counter and prescription medicines only as told by your health care provider.  If you have sleep apnea, surgery and certain medicines can increase your risk for  breathing problems. Follow instructions from your health care provider about wearing your sleep device: ? Anytime you are sleeping, including during daytime naps. ? While taking prescription pain medicines, sleeping medicines, or medicines that make you drowsy.  Avoid smoking.  Keep all follow-up visits as told by your health care provider. This is important. Contact a health care provider if:  You keep feeling nauseous or you keep vomiting.  You feel light-headed.  You are still sleepy or having trouble with balance after 24 hours.  You develop a rash.  You have a fever.  You have redness or swelling around the IV site. Get help right away if:  You have trouble breathing.  You have new-onset confusion at home. Summary  For several hours after your procedure, you may feel tired. You may also be forgetful and have poor judgment.  Have a responsible adult stay with you for the time you are told. It is important to have someone help care for you until you are awake and alert.  Rest as told. Do not drive or operate machinery. Do not drink alcohol or take sleeping pills.  Get help right away if you have trouble breathing, or if you suddenly become confused. This information is not intended to replace advice given to you by your health care provider. Make sure you discuss any questions you have with your health care provider. Document Revised: 08/29/2020 Document Reviewed: 11/16/2019 [employer] Patient Education  2021 [employer].

## 2021-05-06 LAB — SURGICAL PATHOLOGY

## 2021-05-07 ENCOUNTER — Encounter (INDEPENDENT_AMBULATORY_CARE_PROVIDER_SITE_OTHER): Payer: Self-pay | Admitting: *Deleted

## 2021-05-08 ENCOUNTER — Encounter (HOSPITAL_COMMUNITY): Payer: Self-pay | Admitting: Gastroenterology

## 2021-05-14 ENCOUNTER — Ambulatory Visit (INDEPENDENT_AMBULATORY_CARE_PROVIDER_SITE_OTHER): Payer: Medicare Other

## 2021-05-14 ENCOUNTER — Other Ambulatory Visit: Payer: Self-pay

## 2021-05-14 ENCOUNTER — Ambulatory Visit: Payer: Medicare Other

## 2021-05-14 DIAGNOSIS — Z0000 Encounter for general adult medical examination without abnormal findings: Secondary | ICD-10-CM | POA: Diagnosis not present

## 2021-05-14 NOTE — Progress Notes (Unsigned)
Subjective:   [name] is a 60 y.o. female who presents for Medicare Annual (Subsequent) preventive examination.  Review of Systems    N/A        Objective:    There were no vitals filed for this visit. There is no height or weight on file to calculate BMI.  Advanced Directives 04/30/2021 03/28/2021 03/04/2021 02/18/2021 02/13/2021 09/16/2018 02/15/2017  Does Patient Have a Medical Advance Directive? Yes Yes Yes Yes Yes Yes No  Type of Paramedic of [place];Living will [place];Living will Living will;Healthcare Power of [place];Living will Healthcare Power of [place];Living will [place];Living will  Does patient want to make changes to medical advance directive? No - Patient declined No - Patient declined No - Patient declined No - Patient declined No - Patient declined - No - Patient declined  Copy of [place] in Chart? - No - copy requested Yes - validated most recent copy scanned in chart (See row information) No - copy requested No - copy requested Yes Yes  Would patient like information on creating a medical advance directive? No - Patient declined No - Patient declined No - Patient declined No - Patient declined - - No - Patient declined  Pre-existing out of facility DNR order (yellow form or pink MOST form) - - - - - - -  Some encounter information is confidential and restricted. Go to Review Flowsheets activity to see all data.    Current Medications (verified) Outpatient Encounter Medications as of 05/14/2021  Medication Sig  . ALPRAZolam (XANAX) 1 MG tablet Take 1 tablet (1 mg total) by mouth 2 (two) times daily.  Marland Kitchen aspirin 81 MG chewable tablet Chew 81 mg by mouth daily.  . butalbital-acetaminophen-caffeine (FIORICET) 50-325-40 MG tablet Take 1 tablet by mouth as directed.  . Calcium Carbonate-Vitamin D (CALCIUM-VITAMIN D)  600-125 MG-UNIT TABS Take 1 tablet by mouth at bedtime. Nature made [place]  . cholecalciferol (VITAMIN D3) 25 MCG (1000 UNIT) tablet Take 1,000 Units by mouth at bedtime.  . conjugated estrogens (PREMARIN) vaginal cream Place fingertip amount intravaginally 2 to 3 times per week (Patient taking differently: Place 0.1 mg vaginally at bedtime. Place fingertip amount intravaginally 2 to 3 times per week)  . cyanocobalamin (,VITAMIN B-12,) 1000 MCG/ML injection Inject 1,000 mcg into the muscle every 30 (thirty) days.  . cyclobenzaprine (FLEXERIL) 10 MG tablet TAKE 1 TABLET BY MOUTH TWICE DAILY AS NEEDED FOR MUSCLE SPASMS (Patient taking differently: Take 10 mg by mouth in the morning and at bedtime.)  . cycloSPORINE (RESTASIS) 0.05 % ophthalmic emulsion Place 1 drop into both eyes 2 (two) times daily as needed (dry eyes).  . Dextran 70-Hypromellose (ARTIFICIAL TEARS) 0.1-0.3 % SOLN Place 1 drop into both eyes daily as needed (dry eyes).  . DULoxetine (CYMBALTA) 60 MG capsule Take 60 mg by mouth daily.  . ergocalciferol (VITAMIN D2) 1.25 MG (50000 UT) capsule Take 50,000 Units by mouth every Wednesday.  . ezetimibe (ZETIA) 10 MG tablet Take 1 tablet (10 mg total) by mouth daily.  . fluticasone (FLONASE) 50 MCG/ACT nasal spray Place 2 sprays into both nostrils daily. (Patient taking differently: Place 2 sprays into both nostrils daily as needed for allergies.)  . HYDROmorphone (DILAUDID) 4 MG tablet Take 4 mg by mouth in the morning, at noon, and at bedtime.  Javier Docker Oil 1000 MG CAPS Take 1,000 mg by mouth  at bedtime.  Marland Kitchen levothyroxine (SYNTHROID) 125 MCG tablet TAKE 1 TABLET(125 MCG) BY MOUTH DAILY BEFORE BREAKFAST (Patient taking differently: Take 125 mcg by mouth daily before breakfast. TAKE 1 TABLET(125 MCG) BY MOUTH DAILY BEFORE BREAKFAST)  . MAGNESIUM-OXIDE 400 (241.3 Mg) MG tablet TAKE 1 TABLET BY MOUTH 2 TIMES A DAY (Patient taking differently: Take 400 mg by mouth 2 (two) times daily.)  .  meclizine (ANTIVERT) 25 MG tablet Take 1 tablet (25 mg total) by mouth 3 (three) times daily as needed for dizziness.  . metoprolol tartrate (LOPRESSOR) 25 MG tablet Take 1 tablet (25 mg total) by mouth 2 (two) times daily. (Patient taking differently: Take 25 mg by mouth 2 (two) times daily as needed (Afib).)  . Multiple Vitamins-Minerals (ICAPS AREDS 2) CAPS Take 1 capsule by mouth at bedtime.  Marland Kitchen omeprazole (PRILOSEC) 40 MG capsule Take 1 capsule (40 mg total) by mouth daily. (Patient taking differently: Take 40 mg by mouth in the morning and at bedtime.)  . phentermine 37.5 MG capsule Take 37.5 mg by mouth every morning. Takes  half daily  . pravastatin (PRAVACHOL) 40 MG tablet TAKE 1 TABLET(40 MG) BY MOUTH DAILY (Patient taking differently: Take 40 mg by mouth daily. TAKE 1 TABLET(40 MG) BY MOUTH DAILY)  . senna-docusate (SENOKOT-S) 8.6-50 MG per tablet Take 2 tablets by mouth 2 (two) times daily. constipation  . Specialty Vitamins Products (VITAMINS FOR HAIR PO) Take 1 capsule by mouth daily.  . vitamin C (ASCORBIC ACID) 500 MG tablet Take 500 mg by mouth at bedtime.  Alveda Reasons 20 MG TABS tablet TAKE 1 TABLET(20 MG) BY MOUTH DAILY (Patient taking differently: Take 20 mg by mouth daily with supper.)   No facility-administered encounter medications on file as of 05/14/2021.    Allergies (verified) Nitrofurantoin, Crestor [rosuvastatin calcium], Bisacodyl, Clarithromycin, Clarithromycin, Clindamycin hcl, Codeine, Iron sucrose, Monosodium glutamate, and Scopolamine hbr   History: Past Medical History:  Diagnosis Date  . Abdominal hernia 08/14/2019  . Abdominal pain 06/06/2019  . Acute bronchitis 05/01/2013  . Acute cystitis 06/09/2010   Qualifier: Diagnosis of  By: Cori Razor LPN, Brandi    . Acute renal insufficiency 08/28/2011  . Allergy    Phreesia 03/16/2021  . Anemia    Phreesia 10/29/2020  . Anxiety   . Anxiety state 03/16/2008   Qualifier: Diagnosis of  By: Dierdre Harness    . Arthritis     Phreesia 10/29/2020  . Back pain with radiation 07/17/2014  . Bilateral hand pain 07/27/2016  . Blood transfusion without reported diagnosis    Phreesia 10/29/2020  . Breast cancer ([place])    L breast- 2006  . Cancer ([place])    Phreesia 10/29/2020  . Carpal tunnel syndrome   . Chronic constipation   . Chronic pain syndrome    followed by [facility]---  back  . Clotting disorder ([place])    Phreesia 10/29/2020  . Depression   . Dermatitis 12/15/2011  . Difficult intubation   . Educated about COVID-19 virus infection 05/14/2019  . Fall at home 01/26/2016  . Family history of adverse reaction to anesthesia    MOTHER--- PONV  . Fatigue 09/17/2012  . Fibromyalgia   . FIBROMYALGIA 03/16/2008   Qualifier: Diagnosis of  By: Dierdre Harness    . GERD (gastroesophageal reflux disease)   . Headache disorder 01/16/2013  . Headache syndrome 01/26/2016  . Hip pain, right 07/08/2019  . History of MRSA infection    lip abscess  . History of  ovarian cyst 06/2011   s/p  BSO  . History of pulmonary embolus (PE) 1997   post EP with ablation pulmonary veouns for SVT/ Atrial Fib.  . History of supraventricular tachycardia    s/p  ablation 1996  and 1997  by dr Caryl Comes  . History of TIA (transient ischemic attack) 1997   post op EP ablation PE  . Hyperlipidemia   . Hyperlipidemia LDL goal <100 03/16/2008   Qualifier: Diagnosis of  By: Dierdre Harness    . Hypothyroidism    followed by pcp  . Incisional hernia   . Insomnia 12/15/2011  . Intermittent palpitations 08/22/2017  . Interstitial cystitis    09-13-2018   per pt last flare-up  May 2019 (followed by pcp)  . INTERSTITIAL CYSTITIS 02/17/2011   Qualifier: Diagnosis of  By: Moshe Cipro MD, Joycelyn Schmid    . Iron deficiency anemia    09-13-2018  PER PT STABLE  . Irregular heart rate 07/25/2013  . Lipoma of back    upper  . Low back pain radiating to right leg 07/04/2019  . Low ferritin level 06/14/2014  . MDD (major depressive disorder), single episode, in  full remission ([place]) 10/27/2017  . Medically noncompliant 02/28/2015   Multiple missed appointments, both follow-up appointments and lab appointments.   . Metabolic syndrome X [id number]   Qualifier: Diagnosis of  By: Moshe Cipro MD, Margaret  hBA1c is 5.8 in 02/2013   . Migraines   . Morbid obesity ([place]) 03/27/2013  . Muscle spasm 01/03/2020  . Nausea alone 07/17/2014  . NECK PAIN, CHRONIC 03/16/2008   Qualifier: Diagnosis of  By: Dierdre Harness    . Normal coronary arteries    a. by CT 12/2018.  . Obesity 03/16/2008   Qualifier: Diagnosis of  By: Dierdre Harness    . Oral ulceration 08/23/2014   Presented at 06/14/2014 visit   . Other malaise and fatigue 03/16/2008   Centricity Description: FATIGUE, CHRONIC Qualifier: Diagnosis of  By: Dierdre Harness   Centricity Description: FATIGUE Qualifier: Diagnosis of  By: Christy Sartorius, [place]    . OVARIAN CYST 12/26/2009   Qualifier: Diagnosis of  By: Moshe Cipro MD, Joycelyn Schmid    . Paget disease of breast, left ([place])   . Paget's disease of breast, left ([place]) 03/16/2008   Qualifier: Diagnosis of  By: Dierdre Harness  Left diagnosed in 2006 F/h breast cancer x 15 family members  . Partial small bowel obstruction ([place]) 07/04/2012  . PONV (postoperative nausea and vomiting)    SEVERE  . Postsurgical menopause 11/13/2011  . Presence of IVC filter 06/06/2019  . PSVT (paroxysmal supraventricular tachycardia) ([place])    [place]  . Recurrent oral herpes simplex infection 10/13/2017  . ROM (right otitis media) 01/23/2013  . S/P insertion of IVC (inferior vena caval) filter 05/08/2005   [place] (non-retrievable)  /  dx 2019  a leg of filter is protruding thru the vena cava in to right L2 vertebral body (09-13-2018  per pt having surgery to remove filter in [place])  . S/P radiofrequency ablation operation for arrhythmia 1996   1996  and 1997,   SVT and Atrial Fib  . Sinusitis, chronic 01/26/2016  . SMALL BOWEL OBSTRUCTION, HX OF 08/07/2008   Annotation:  obstruction w/ adhesions led to partial colectomy Qualifier: Diagnosis of  By: Craige Cotta    . Stroke [facility])    Phreesia 10/29/2020, TIAs in the past  . Swelling of hand 09/17/2012  . Syncope   . Tachycardia  02/03/2010   Qualifier: Diagnosis of  By: Via LPN, Jeani Hawking    . Thyroid disease    Phreesia 10/29/2020  . Ulcer 12/15/2011  . Urinary frequency 07/18/2014  . Vaginitis and vulvovaginitis 05/10/2013  . Vitamin D deficiency 05/05/2015  . Wears glasses    Past Surgical History:  Procedure Laterality Date  . ABDOMINAL HYSTERECTOMY  1987  . ANTERIOR CERVICAL DECOMP/DISCECTOMY FUSION  03-07-2002   dr elsner  @[facility]    C 4 -- 5  . APPENDECTOMY  1980  . AUGMENTATION MAMMAPLASTY Right 2006  . BILATERAL SALPINGOOPHORECTOMY  07/24/2011   via Explor. Lap. w/ intraoperative perf. bowel repair  . BIOPSY  05/02/2021   Procedure: BIOPSY;  Surgeon: Montez Morita, Quillian Quince, MD;  [facility];  Service: Gastroenterology;;  small bowel, mid esophagus, distal esophagus, random colon biopsies  . BREAST BIOPSY Right 2019   benign  . BREAST ENHANCEMENT SURGERY Bilateral 1993  . BREAST IMPLANT REMOVAL Bilateral   . BREAST SURGERY N/A    Phreesia 10/29/2020  . CARDIAC CATHETERIZATION  07-06-2003   dr Darnell Level brodie   normal coronaries and LVF  . CARDIAC ELECTROPHYSIOLOGY STUDY AND ABLATION  1996  and 1997  . CARDIOVASCULAR STRESS TEST  11-18-2015   dr Caryl Comes   normal nuclear study w/ no ischemia/  normal LV function and wall motion , ef 84%  . CARPAL TUNNEL RELEASE Right ?  . [place]  . CESAREAN SECTION N/A    Phreesia 10/29/2020  . CHOLECYSTECTOMY N/A    Phreesia 10/29/2020  . COLON SURGERY    . COLONOSCOPY WITH PROPOFOL N/A 05/02/2021   Procedure: COLONOSCOPY WITH PROPOFOL;  Surgeon: Harvel Quale, MD;  [facility];  Service: Gastroenterology;  Laterality: N/A;  12:30 PM  . CYSTO/  HYDRODISTENTION/  INSTILATION THERAPY  MULTIPLE  . ENTEROCUTANEOUS FISTULA  CLOSURE  multiple   last one 2015 with small bowel resection  . ESOPHAGOGASTRODUODENOSCOPY (EGD) WITH PROPOFOL N/A 05/02/2021   Procedure: ESOPHAGOGASTRODUODENOSCOPY (EGD) WITH PROPOFOL;  Surgeon: Harvel Quale, MD;  [facility];  Service: Gastroenterology;  Laterality: N/A;  . EXPLORATORY LAPAROTOMY INCISIONAL VENTRAL HERNIA REPAIR / RESECTION SMALL BOWEL  11-09-2014   @[facility]   . FRACTURE SURGERY N/A    Phreesia 10/29/2020  . JOINT REPLACEMENT N/A    Phreesia 10/29/2020  . LIPOMA EXCISION Right 09/16/2018   Procedure: EXCISION LIPOMA UPPER BACK;  Surgeon: Clovis Riley, MD;  Location: [place];  Service: General;  Laterality: Right;  . MASTECTOMY Left 2006   w/ reconstruction on left (paget's disease)  and right breast augmentation  . POLYPECTOMY  05/02/2021   Procedure: POLYPECTOMY;  Surgeon: Harvel Quale, MD;  [facility];  Service: Gastroenterology;;  gastric  . SAVORY DILATION  05/02/2021   Procedure: SAVORY DILATION;  Surgeon: Harvel Quale, MD;  [facility];  Service: Gastroenterology;;  . SMALL INTESTINE SURGERY N/A    Phreesia 10/29/2020  . SPINE SURGERY N/A    Phreesia 10/29/2020  . TOTAL COLECTOMY  08-04-2002    @[facility]    AND CHOLECYSTECTOMY  (colonic inertia)  . TRANSTHORACIC ECHOCARDIOGRAM  02/04/2016   ef 60-65%,  grade 2 diastolic dysfunction/  mild MR  . TUBAL LIGATION N/A    Phreesia 03/16/2021  . VENA CAVA FILTER PLACEMENT  05/08/2005   @[facility]    [place] (non-retrievable)  . WIDE EXCISION PERIRECTAL ABSCESSES  09-22-2005   @ [facility]   Family History  Problem Relation  Age of Onset  . Diabetes Mother   . Heart disease Mother   . Hypertension Mother   . Heart disease Father   . Hyperlipidemia Father   . Hypertension Father   . Alcohol abuse Father   . Colon cancer Maternal Aunt   . Breast cancer Maternal Aunt   . Cancer Maternal Uncle        mets  . Bone cancer Maternal  Grandfather        mets  . Ovarian cancer Cousin 41  . Breast cancer Cousin   . Prostate cancer Maternal Uncle   . Breast cancer Maternal Aunt   . Brain cancer Maternal Aunt    Social History   Socioeconomic History  . Marital status: Married    Spouse name: Not on file  . Number of children: Not on file  . Years of education: Not on file  . Highest education level: Not on file  Occupational History  . Not on file  Tobacco Use  . Smoking status: Never Smoker  . Smokeless tobacco: Never Used  Vaping Use  . Vaping Use: Never used  Substance and Sexual Activity  . Alcohol use: No    Comment: twice per year  . Drug use: No  . Sexual activity: Yes    Birth control/protection: Surgical  Other Topics Concern  . Not on file  Social History Narrative  . Not on file   Social Determinants of Health   Financial Resource Strain: Medium Risk  . Difficulty of Paying Living Expenses: Somewhat hard  Food Insecurity: Food Insecurity Present  . Worried About Charity fundraiser in the Last Year: Sometimes true  . Ran Out of Food in the Last Year: Often true  Transportation Needs: No Transportation Needs  . Lack of Transportation (Medical): No  . Lack of Transportation (Non-Medical): No  Physical Activity: Inactive  . Days of Exercise per Week: 0 days  . Minutes of Exercise per Session: 0 min  Stress: Stress Concern Present  . Feeling of Stress : Rather much  Social Connections: Moderately Integrated  . Frequency of Communication with Friends and Family: More than three times a week  . Frequency of Social Gatherings with Friends and Family: Once a week  . Attends Religious Services: Never  . Active Member of Clubs or Organizations: Yes  . Attends Archivist Meetings: Never  . Marital Status: Married    Tobacco Counseling Counseling given: Not Answered   Clinical Intake:                 Diabetic?No         Activities of Daily Living In your  present state of health, do you have any difficulty performing the following activities: 04/30/2021 10/31/2020  Hearing? N Y  Vision? N N  Difficulty concentrating or making decisions? N Y  Walking or climbing stairs? Y Y  Comment d/t ventral hernia -  Dressing or bathing? N Y  Doing errands, shopping? N Y  Conservation officer, nature and eating ? - N  Using the Toilet? - N  In the past six months, have you accidently leaked urine? - Y  Do you have problems with loss of bowel control? - N  Managing your Medications? - N  Managing your Finances? - N  Housekeeping or managing your Housekeeping? - N  Some recent data might be hidden    Patient Care Team: Fayrene Helper, MD as PCP - General Deboraha Sprang, MD as  PCP - Cardiology (Cardiology) Selinda Eon (Inactive) as Referring Physician Ray, Debroah Loop, MD (Anesthesiology) Hennie Duos, MD as Consulting Physician (Rheumatology) Ray, Debroah Loop, MD as Referring Physician (Anesthesiology) Lovenia Shuck, MD as Referring Physician (Cardiothoracic Surgery)  Indicate any recent Medical Services you may have received from other than [facility] providers in the past year (date may be approximate).     Assessment:   This is a routine wellness examination for [name].  Hearing/Vision screen No exam data present  Dietary issues and exercise activities discussed:    Goals Addressed   None    Depression Screen PHQ 2/9 Scores 04/07/2021 10/31/2020 10/31/2020 10/22/2020 07/08/2020 01/03/2020 10/17/2019  PHQ - 2 Score 0 0 0 0 0 0 0  PHQ- 9 Score - - - 11 - - -    Fall Risk Fall Risk  04/07/2021 10/31/2020 10/31/2020 10/22/2020 07/08/2020  Falls in the past year? 0 1 1 0 0  Number falls in past yr: 0 1 1 0 -  Injury with Fall? 0 1 1 0 -  Risk for fall due to : No Fall Risks History of fall(s);Impaired balance/gait;Other (Comment) - No Fall Risks -  Follow up Falls evaluation completed Education provided;Falls prevention discussed - Falls evaluation  completed -    FALL RISK PREVENTION PERTAINING TO THE HOME:  Any stairs in or around the home? No  If so, are there any without handrails? No  Home free of loose throw rugs in walkways, pet beds, electrical cords, etc? Yes  Adequate lighting in your home to reduce risk of falls? Yes   ASSISTIVE DEVICES UTILIZED TO PREVENT FALLS:  Life alert? No  Use of a cane, walker or w/c? No  Grab bars in the bathroom? Yes  Shower chair or bench in shower? Yes  Elevated toilet seat or a handicapped toilet? No     Cognitive Function:     6CIT Screen 10/31/2020 05/03/2019  What Year? 0 points 0 points  What month? 0 points 0 points  What time? 0 points 0 points  Count back from 20 0 points 0 points  Months in reverse - 0 points  Repeat phrase - 0 points  Total Score - 0    Immunizations Immunization History  Administered Date(s) Administered  . Hepatitis A 06/27/2009, 12/26/2009  . Hepatitis A, Adult 06/27/2009, 12/26/2009  . Hepatitis B 06/27/2009, 08/06/2009, 12/26/2009  . Influenza Split 09/15/2012  . Influenza Whole 09/13/2007, 10/01/2008, 09/09/2010, 09/23/2011  . Influenza, Seasonal, Injecte, Preservative Fre 10/08/2014  . Influenza,inj,Quad PF,6+ Mos 10/25/2013, 09/23/2015, 09/18/2016, 10/11/2017, 08/25/2018, 10/17/2019, 10/05/2020  . Influenza-Unspecified 09/15/2012, 09/27/2013  . Moderna Sars-Covid-2 Vaccination 03/18/2020, 04/22/2020  . Pneumococcal Polysaccharide-23 09/23/2011  . Td 10/27/2005  . Tdap 12/22/2016    TDAP status: Up to date  Flu Vaccine status: Up to date  Pneumococcal vaccine status: Up to date  Covid-19 vaccine status: Completed vaccines  Qualifies for Shingles Vaccine? Yes   Zostavax completed No   Shingrix Completed?: No.    Education has been provided regarding the importance of this vaccine. Patient has been advised to call insurance company to determine out of pocket expense if they have not yet received this vaccine. Advised may also receive  vaccine at local pharmacy or Health Dept. Verbalized acceptance and understanding.  Screening Tests Health Maintenance  Topic Date Due  . COVID-19 Vaccine (3 - Moderna risk 4-dose series) 04/07/2022 (Originally 05/20/2020)  . INFLUENZA VACCINE  07/28/2021  . MAMMOGRAM  04/08/2022  . PAP SMEAR-Modifier  02/26/2023  . TETANUS/TDAP  12/22/2026  . COLONOSCOPY (Pts 45-29yrs Insurance coverage will need to be confirmed)  05/03/2031  . Hepatitis C Screening  Completed  . HIV Screening  Completed  . HPV VACCINES  Aged Out    Health Maintenance  There are no preventive care reminders to display for this patient.  Colorectal cancer screening: Type of screening: Colonoscopy. Completed 05/02/2021. Repeat every 10 years  Mammogram status: Ordered 04/03/2021. Pt provided with contact info and advised to call to schedule appt.   Bone Density Status: Not required until age 59  Lung Cancer Screening: (Low Dose CT Chest recommended if Age 82-80 years, 30 pack-year currently smoking OR have quit w/in 15years.) does not qualify.   Lung Cancer Screening Referral: N/A  Additional Screening:  Hepatitis C Screening: does qualify; Completed 01/27/2016  Vision Screening: Recommended annual ophthalmology exams for early detection of glaucoma and other disorders of the eye. Is the patient up to date with their annual eye exam?  Yes  Who is the provider or what is the name of the office in which the patient attends annual eye exams? Dr.Cotter If pt is not established with a provider, would they like to be referred to a provider to establish care? No .   Dental Screening: Recommended annual dental exams for proper oral hygiene  Community Resource Referral / Chronic Care Management: CRR required this visit?  No   CCM required this visit?  No      Plan:     I have personally reviewed and noted the following in the patient's chart:   . Medical and social history . Use of alcohol, tobacco or  illicit drugs  . Current medications and supplements including opioid prescriptions.  . Functional ability and status . Nutritional status . Physical activity . Advanced directives . List of other physicians . Hospitalizations, surgeries, and ER visits in previous 12 months . Vitals . Screenings to include cognitive, depression, and falls . Referrals and appointments  In addition, I have reviewed and discussed with patient certain preventive protocols, quality metrics, and best practice recommendations. A written personalized care plan for preventive services as well as general preventive health recommendations were provided to patient.     Ofilia Neas, LPN   4/58/[id number]   Nurse Notes: None

## 2021-05-14 NOTE — Patient Instructions (Signed)
[name] , Thank you for taking time to come for your Medicare Wellness Visit. I appreciate your ongoing commitment to your health goals. Please review the following plan we discussed and let me know if I can assist you in the future.   Screening recommendations/referrals: Colonoscopy: Up to date, next due 05/03/2031 Mammogram: Currently due, please keep upcoming appointment  Bone Density: Not due until age 60 Recommended yearly ophthalmology/optometry visit for glaucoma screening and checkup Recommended yearly dental visit for hygiene and checkup  Vaccinations: Influenza vaccine: Up to date, next due fall 2022 Pneumococcal vaccine: Next due age 40 Tdap vaccine: Up to date, next due 12/22/2026 Shingles vaccine: Currently due for Shingrix, if you would like to receive we recommend that you do so at your local pharmacy     Advanced directives: Copies on file   Conditions/risks identified: None   Next appointment: None  Preventive Care 40-64 Years, Female Preventive care refers to lifestyle choices and visits with your health care provider that can promote health and wellness. What does preventive care include?  A yearly physical exam. This is also called an annual well check.  Dental exams once or twice a year.  Routine eye exams. Ask your health care provider how often you should have your eyes checked.  Personal lifestyle choices, including:  Daily care of your teeth and gums.  Regular physical activity.  Eating a healthy diet.  Avoiding tobacco and drug use.  Limiting alcohol use.  Practicing safe sex.  Taking low-dose aspirin daily starting at age 51.  Taking vitamin and mineral supplements as recommended by your health care provider. What happens during an annual well check? The services and screenings done by your health care provider during your annual well check will depend on your age, overall health, lifestyle risk factors, and family history of  disease. Counseling  Your health care provider may ask you questions about your:  Alcohol use.  Tobacco use.  Drug use.  Emotional well-being.  Home and relationship well-being.  Sexual activity.  Eating habits.  Work and work Statistician.  Method of birth control.  Menstrual cycle.  Pregnancy history. Screening  You may have the following tests or measurements:  Height, weight, and BMI.  Blood pressure.  Lipid and cholesterol levels. These may be checked every 5 years, or more frequently if you are over 76 years old.  Skin check.  Lung cancer screening. You may have this screening every year starting at age 34 if you have a 30-pack-year history of smoking and currently smoke or have quit within the past 15 years.  Fecal occult blood test (FOBT) of the stool. You may have this test every year starting at age 19.  Flexible sigmoidoscopy or colonoscopy. You may have a sigmoidoscopy every 5 years or a colonoscopy every 10 years starting at age 83.  Hepatitis C blood test.  Hepatitis B blood test.  Sexually transmitted disease (STD) testing.  Diabetes screening. This is done by checking your blood sugar (glucose) after you have not eaten for a while (fasting). You may have this done every 1-3 years.  Mammogram. This may be done every 1-2 years. Talk to your health care provider about when you should start having regular mammograms. This may depend on whether you have a family history of breast cancer.  BRCA-related cancer screening. This may be done if you have a family history of breast, ovarian, tubal, or peritoneal cancers.  Pelvic exam and Pap test. This may be done every  3 years starting at age 82. Starting at age 24, this may be done every 5 years if you have a Pap test in combination with an HPV test.  Bone density scan. This is done to screen for osteoporosis. You may have this scan if you are at high risk for osteoporosis. Discuss your test results,  treatment options, and if necessary, the need for more tests with your health care provider. Vaccines  Your health care provider may recommend certain vaccines, such as:  Influenza vaccine. This is recommended every year.  Tetanus, diphtheria, and acellular pertussis (Tdap, Td) vaccine. You may need a Td booster every 10 years.  Zoster vaccine. You may need this after age 63.  Pneumococcal 13-valent conjugate (PCV13) vaccine. You may need this if you have certain conditions and were not previously vaccinated.  Pneumococcal polysaccharide (PPSV23) vaccine. You may need one or two doses if you smoke cigarettes or if you have certain conditions. Talk to your health care provider about which screenings and vaccines you need and how often you need them. This information is not intended to replace advice given to you by your health care provider. Make sure you discuss any questions you have with your health care provider. Document Released: 01/10/2016 Document Revised: 09/02/2016 Document Reviewed: 10/15/2015 Elsevier Interactive Patient Education  2017 [employer] Prevention in the Home Falls can cause injuries. They can happen to people of all ages. There are many things you can do to make your home safe and to help prevent falls. What can I do on the outside of my home?  Regularly fix the edges of walkways and driveways and fix any cracks.  Remove anything that might make you trip as you walk through a door, such as a raised step or threshold.  Trim any bushes or trees on the path to your home.  Use bright outdoor lighting.  Clear any walking paths of anything that might make someone trip, such as rocks or tools.  Regularly check to see if handrails are loose or broken. Make sure that both sides of any steps have handrails.  Any raised decks and porches should have guardrails on the edges.  Have any leaves, snow, or ice cleared regularly.  Use sand or salt on walking  paths during winter.  Clean up any spills in your garage right away. This includes oil or grease spills. What can I do in the bathroom?  Use night lights.  Install grab bars by the toilet and in the tub and shower. Do not use towel bars as grab bars.  Use non-skid mats or decals in the tub or shower.  If you need to sit down in the shower, use a plastic, non-slip stool.  Keep the floor dry. Clean up any water that spills on the floor as soon as it happens.  Remove soap buildup in the tub or shower regularly.  Attach bath mats securely with double-sided non-slip rug tape.  Do not have throw rugs and other things on the floor that can make you trip. What can I do in the bedroom?  Use night lights.  Make sure that you have a light by your bed that is easy to reach.  Do not use any sheets or blankets that are too big for your bed. They should not hang down onto the floor.  Have a firm chair that has side arms. You can use this for support while you get dressed.  Do not have throw  rugs and other things on the floor that can make you trip. What can I do in the kitchen?  Clean up any spills right away.  Avoid walking on wet floors.  Keep items that you use a lot in easy-to-reach places.  If you need to reach something above you, use a strong step stool that has a grab bar.  Keep electrical cords out of the way.  Do not use floor polish or wax that makes floors slippery. If you must use wax, use non-skid floor wax.  Do not have throw rugs and other things on the floor that can make you trip. What can I do with my stairs?  Do not leave any items on the stairs.  Make sure that there are handrails on both sides of the stairs and use them. Fix handrails that are broken or loose. Make sure that handrails are as long as the stairways.  Check any carpeting to make sure that it is firmly attached to the stairs. Fix any carpet that is loose or worn.  Avoid having throw rugs at  the top or bottom of the stairs. If you do have throw rugs, attach them to the floor with carpet tape.  Make sure that you have a light switch at the top of the stairs and the bottom of the stairs. If you do not have them, ask someone to add them for you. What else can I do to help prevent falls?  Wear shoes that:  Do not have high heels.  Have rubber bottoms.  Are comfortable and fit you well.  Are closed at the toe. Do not wear sandals.  If you use a stepladder:  Make sure that it is fully opened. Do not climb a closed stepladder.  Make sure that both sides of the stepladder are locked into place.  Ask someone to hold it for you, if possible.  Clearly mark and make sure that you can see:  Any grab bars or handrails.  First and last steps.  Where the edge of each step is.  Use tools that help you move around (mobility aids) if they are needed. These include:  Canes.  Walkers.  Scooters.  Crutches.  Turn on the lights when you go into a dark area. Replace any light bulbs as soon as they burn out.  Set up your furniture so you have a clear path. Avoid moving your furniture around.  If any of your floors are uneven, fix them.  If there are any pets around you, be aware of where they are.  Review your medicines with your doctor. Some medicines can make you feel dizzy. This can increase your chance of falling. Ask your doctor what other things that you can do to help prevent falls. This information is not intended to replace advice given to you by your health care provider. Make sure you discuss any questions you have with your health care provider. Document Released: 10/10/2009 Document Revised: 05/21/2016 Document Reviewed: 01/18/2015 Elsevier Interactive Patient Education  2017 [employer].

## 2021-05-14 NOTE — Progress Notes (Signed)
Subjective:   [name] is a 60 y.o. female who presents for Medicare Annual (Subsequent) preventive examination.  I connected with [name] today by telephone and verified that I am speaking with the correct person using two identifiers. Location patient: home Location provider: work Persons participating in the virtual visit: patient, provider.   I discussed the limitations, risks, security and privacy concerns of performing an evaluation and management service by telephone and the availability of in person appointments. I also discussed with the patient that there may be a patient responsible charge related to this service. The patient expressed understanding and verbally consented to this telephonic visit.    Interactive audio and video telecommunications were attempted between this provider and patient, however failed, due to patient having technical difficulties OR patient did not have access to video capability.  We continued and completed visit with audio only.     Review of Systems    N/A Cardiac Risk Factors include: obesity (BMI >30kg/m2);dyslipidemia;hypertension     Objective:    Today's Vitals   05/14/21 1647  PainSc: 7    There is no height or weight on file to calculate BMI.  Advanced Directives 05/14/2021 05/14/2021 04/30/2021 03/28/2021 03/04/2021 02/18/2021 02/13/2021  Does Patient Have a Medical Advance Directive? Yes Yes Yes Yes Yes Yes Yes  Type of Advance Directive - [place];Living will [place];Living will [place];Living will Living will;Healthcare Power of [place];Living will [place]  Does patient want to make changes to medical advance directive? - No - Patient declined No - Patient declined No - Patient declined No - Patient declined No - Patient declined No - Patient declined  Copy of Laura in Chart? Yes -  validated most recent copy scanned in chart (See row information) Yes - validated most recent copy scanned in chart (See row information) - No - copy requested Yes - validated most recent copy scanned in chart (See row information) No - copy requested No - copy requested  Would patient like information on creating a medical advance directive? - - No - Patient declined No - Patient declined No - Patient declined No - Patient declined -  Pre-existing out of facility DNR order (yellow form or pink MOST form) - - - - - - -  Some encounter information is confidential and restricted. Go to Review Flowsheets activity to see all data.    Current Medications (verified) Outpatient Encounter Medications as of 05/14/2021  Medication Sig  . ALPRAZolam (XANAX) 1 MG tablet Take 1 tablet (1 mg total) by mouth 2 (two) times daily.  Marland Kitchen aspirin 81 MG chewable tablet Chew 81 mg by mouth daily.  . butalbital-acetaminophen-caffeine (FIORICET) 50-325-40 MG tablet Take 1 tablet by mouth as directed.  . Calcium Carbonate-Vitamin D (CALCIUM-VITAMIN D) 600-125 MG-UNIT TABS Take 1 tablet by mouth at bedtime. Nature made [place]  . cholecalciferol (VITAMIN D3) 25 MCG (1000 UNIT) tablet Take 1,000 Units by mouth at bedtime.  . conjugated estrogens (PREMARIN) vaginal cream Place fingertip amount intravaginally 2 to 3 times per week (Patient taking differently: Place 0.1 mg vaginally at bedtime. Place fingertip amount intravaginally 2 to 3 times per week)  . cyanocobalamin (,VITAMIN B-12,) 1000 MCG/ML injection Inject 1,000 mcg into the muscle every 30 (thirty) days.  . cyclobenzaprine (FLEXERIL) 10 MG tablet TAKE 1 TABLET BY MOUTH TWICE DAILY AS NEEDED FOR MUSCLE SPASMS (Patient taking differently: Take  10 mg by mouth in the morning and at bedtime.)  . cycloSPORINE (RESTASIS) 0.05 % ophthalmic emulsion Place 1 drop into both eyes 2 (two) times daily as needed (dry eyes).  . Dextran 70-Hypromellose (ARTIFICIAL TEARS) 0.1-0.3 %  SOLN Place 1 drop into both eyes daily as needed (dry eyes).  . DULoxetine (CYMBALTA) 60 MG capsule Take 60 mg by mouth daily.  . ergocalciferol (VITAMIN D2) 1.25 MG (50000 UT) capsule Take 50,000 Units by mouth every Wednesday.  . ezetimibe (ZETIA) 10 MG tablet Take 1 tablet (10 mg total) by mouth daily.  . fluticasone (FLONASE) 50 MCG/ACT nasal spray Place 2 sprays into both nostrils daily. (Patient taking differently: Place 2 sprays into both nostrils daily as needed for allergies.)  . HYDROmorphone (DILAUDID) 4 MG tablet Take 4 mg by mouth in the morning, at noon, and at bedtime.  Javier Docker Oil 1000 MG CAPS Take 1,000 mg by mouth at bedtime.  Marland Kitchen levothyroxine (SYNTHROID) 125 MCG tablet TAKE 1 TABLET(125 MCG) BY MOUTH DAILY BEFORE BREAKFAST (Patient taking differently: Take 125 mcg by mouth daily before breakfast. TAKE 1 TABLET(125 MCG) BY MOUTH DAILY BEFORE BREAKFAST)  . MAGNESIUM-OXIDE 400 (241.3 Mg) MG tablet TAKE 1 TABLET BY MOUTH 2 TIMES A DAY (Patient taking differently: Take 400 mg by mouth 2 (two) times daily.)  . meclizine (ANTIVERT) 25 MG tablet Take 1 tablet (25 mg total) by mouth 3 (three) times daily as needed for dizziness.  . metoprolol tartrate (LOPRESSOR) 25 MG tablet Take 1 tablet (25 mg total) by mouth 2 (two) times daily. (Patient taking differently: Take 25 mg by mouth 2 (two) times daily as needed (Afib).)  . Multiple Vitamins-Minerals (ICAPS AREDS 2) CAPS Take 1 capsule by mouth at bedtime.  Marland Kitchen omeprazole (PRILOSEC) 40 MG capsule Take 1 capsule (40 mg total) by mouth daily. (Patient taking differently: Take 40 mg by mouth in the morning and at bedtime.)  . phentermine 37.5 MG capsule Take 37.5 mg by mouth every morning. Takes  half daily  . pravastatin (PRAVACHOL) 40 MG tablet TAKE 1 TABLET(40 MG) BY MOUTH DAILY (Patient taking differently: Take 40 mg by mouth daily. TAKE 1 TABLET(40 MG) BY MOUTH DAILY)  . senna-docusate (SENOKOT-S) 8.6-50 MG per tablet Take 2 tablets by mouth 2  (two) times daily. constipation  . Specialty Vitamins Products (VITAMINS FOR HAIR PO) Take 1 capsule by mouth daily.  . vitamin C (ASCORBIC ACID) 500 MG tablet Take 500 mg by mouth at bedtime.  Alveda Reasons 20 MG TABS tablet TAKE 1 TABLET(20 MG) BY MOUTH DAILY (Patient taking differently: Take 20 mg by mouth daily with supper.)   No facility-administered encounter medications on file as of 05/14/2021.    Allergies (verified) Nitrofurantoin, Crestor [rosuvastatin calcium], Bisacodyl, Clarithromycin, Clarithromycin, Clindamycin hcl, Codeine, Iron sucrose, Monosodium glutamate, and Scopolamine hbr   History: Past Medical History:  Diagnosis Date  . Abdominal hernia 08/14/2019  . Abdominal pain 06/06/2019  . Acute bronchitis 05/01/2013  . Acute cystitis 06/09/2010   Qualifier: Diagnosis of  By: Cori Razor LPN, Brandi    . Acute renal insufficiency 08/28/2011  . Allergy    Phreesia 03/16/2021  . Anemia    Phreesia 10/29/2020  . Anxiety   . Anxiety state 03/16/2008   Qualifier: Diagnosis of  By: Dierdre Harness    . Arthritis    Phreesia 10/29/2020  . Back pain with radiation 07/17/2014  . Bilateral hand pain 07/27/2016  . Blood transfusion without reported diagnosis    Phreesia  10/29/2020  . Breast cancer ([place])    L breast- 2006  . Cancer ([place])    Phreesia 10/29/2020  . Carpal tunnel syndrome   . Chronic constipation   . Chronic pain syndrome    followed by [facility]---  back  . Clotting disorder ([place])    Phreesia 10/29/2020  . Depression   . Dermatitis 12/15/2011  . Difficult intubation   . Educated about COVID-19 virus infection 05/14/2019  . Fall at home 01/26/2016  . Family history of adverse reaction to anesthesia    MOTHER--- PONV  . Fatigue 09/17/2012  . Fibromyalgia   . FIBROMYALGIA 03/16/2008   Qualifier: Diagnosis of  By: Dierdre Harness    . GERD (gastroesophageal reflux disease)   . Headache disorder 01/16/2013  . Headache syndrome 01/26/2016  . Hip pain, right 07/08/2019   . History of MRSA infection    lip abscess  . History of ovarian cyst 06/2011   s/p  BSO  . History of pulmonary embolus (PE) 1997   post EP with ablation pulmonary veouns for SVT/ Atrial Fib.  . History of supraventricular tachycardia    s/p  ablation 1996  and 1997  by dr Caryl Comes  . History of TIA (transient ischemic attack) 1997   post op EP ablation PE  . Hyperlipidemia   . Hyperlipidemia LDL goal <100 03/16/2008   Qualifier: Diagnosis of  By: Dierdre Harness    . Hypothyroidism    followed by pcp  . Incisional hernia   . Insomnia 12/15/2011  . Intermittent palpitations 08/22/2017  . Interstitial cystitis    09-13-2018   per pt last flare-up  May 2019 (followed by pcp)  . INTERSTITIAL CYSTITIS 02/17/2011   Qualifier: Diagnosis of  By: Moshe Cipro MD, Joycelyn Schmid    . Iron deficiency anemia    09-13-2018  PER PT STABLE  . Irregular heart rate 07/25/2013  . Lipoma of back    upper  . Low back pain radiating to right leg 07/04/2019  . Low ferritin level 06/14/2014  . MDD (major depressive disorder), single episode, in full remission ([place]) 10/27/2017  . Medically noncompliant 02/28/2015   Multiple missed appointments, both follow-up appointments and lab appointments.   . Metabolic syndrome X 05/14/6159   Qualifier: Diagnosis of  By: Moshe Cipro MD, Margaret  hBA1c is 5.8 in 02/2013   . Migraines   . Morbid obesity ([place]) 03/27/2013  . Muscle spasm 01/03/2020  . Nausea alone 07/17/2014  . NECK PAIN, CHRONIC 03/16/2008   Qualifier: Diagnosis of  By: Dierdre Harness    . Normal coronary arteries    a. by CT 12/2018.  . Obesity 03/16/2008   Qualifier: Diagnosis of  By: Dierdre Harness    . Oral ulceration 08/23/2014   Presented at 06/14/2014 visit   . Other malaise and fatigue 03/16/2008   Centricity Description: FATIGUE, CHRONIC Qualifier: Diagnosis of  By: Dierdre Harness   Centricity Description: FATIGUE Qualifier: Diagnosis of  By: Christy Sartorius, [place]    . OVARIAN CYST 12/26/2009   Qualifier: Diagnosis of   By: Moshe Cipro MD, Joycelyn Schmid    . Paget disease of breast, left ([place])   . Paget's disease of breast, left ([place]) 03/16/2008   Qualifier: Diagnosis of  By: Dierdre Harness  Left diagnosed in 2006 F/h breast cancer x 15 family members  . Partial small bowel obstruction ([place]) 07/04/2012  . PONV (postoperative nausea and vomiting)    SEVERE  . Postsurgical menopause 11/13/2011  . Presence of IVC  filter 06/06/2019  . PSVT (paroxysmal supraventricular tachycardia) ([place])    [place]  . Recurrent oral herpes simplex infection 10/13/2017  . ROM (right otitis media) 01/23/2013  . S/P insertion of IVC (inferior vena caval) filter 05/08/2005   [place] (non-retrievable)  /  dx 2019  a leg of filter is protruding thru the vena cava in to right L2 vertebral body (09-13-2018  per pt having surgery to remove filter in [place])  . S/P radiofrequency ablation operation for arrhythmia 1996   1996  and 1997,   SVT and Atrial Fib  . Sinusitis, chronic 01/26/2016  . SMALL BOWEL OBSTRUCTION, HX OF 08/07/2008   Annotation: obstruction w/ adhesions led to partial colectomy Qualifier: Diagnosis of  By: Craige Cotta    . Stroke [facility])    Phreesia 10/29/2020, TIAs in the past  . Swelling of hand 09/17/2012  . Syncope   . Tachycardia 02/03/2010   Qualifier: Diagnosis of  By: Via LPN, Jeani Hawking    . Thyroid disease    Phreesia 10/29/2020  . Ulcer 12/15/2011  . Urinary frequency 07/18/2014  . Vaginitis and vulvovaginitis 05/10/2013  . Vitamin D deficiency 05/05/2015  . Wears glasses    Past Surgical History:  Procedure Laterality Date  . ABDOMINAL HYSTERECTOMY  1987  . ANTERIOR CERVICAL DECOMP/DISCECTOMY FUSION  03-07-2002   dr elsner  @[facility]    C 4 -- 5  . APPENDECTOMY  1980  . AUGMENTATION MAMMAPLASTY Right 2006  . BILATERAL SALPINGOOPHORECTOMY  07/24/2011   via Explor. Lap. w/ intraoperative perf. bowel repair  . BIOPSY  05/02/2021   Procedure: BIOPSY;  Surgeon: Montez Morita, Quillian Quince, MD;  Location[facility]  END[facility];  Service: Gastroenterology;;  small bowel, mid esophagus, distal esophagus, random colon biopsies  . BREAST BIOPSY Right 2019   benign  . BREAST ENHANCEMENT SURGERY Bilateral 1993  . BREAST IMPLANT REMOVAL Bilateral   . BREAST SURGERY N/A    Phreesia 10/29/2020  . CARDIAC CATHETERIZATION  07-06-2003   dr Darnell Level brodie   normal coronaries and LVF  . CARDIAC ELECTROPHYSIOLOGY STUDY AND ABLATION  1996  and 1997  . CARDIOVASCULAR STRESS TEST  11-18-2015   dr Caryl Comes   normal nuclear study w/ no ischemia/  normal LV function and wall motion , ef 84%  . CARPAL TUNNEL RELEASE Right ?  . [place]  . CESAREAN SECTION N/A    Phreesia 10/29/2020  . CHOLECYSTECTOMY N/A    Phreesia 10/29/2020  . COLON SURGERY    . COLONOSCOPY WITH PROPOFOL N/A 05/02/2021   Procedure: COLONOSCOPY WITH PROPOFOL;  Surgeon: Harvel Quale, MD;  Location[facility];  Service: Gastroenterology;  Laterality: N/A;  12:30 PM  . CYSTO/  HYDRODISTENTION/  INSTILATION THERAPY  MULTIPLE  . ENTEROCUTANEOUS FISTULA CLOSURE  multiple   last one 2015 with small bowel resection  . ESOPHAGOGASTRODUODENOSCOPY (EGD) WITH PROPOFOL N/A 05/02/2021   Procedure: ESOPHAGOGASTRODUODENOSCOPY (EGD) WITH PROPOFOL;  Surgeon: Harvel Quale, MD;  Location[facility];  Service: Gastroenterology;  Laterality: N/A;  . EXPLORATORY LAPAROTOMY INCISIONAL VENTRAL HERNIA REPAIR / RESECTION SMALL BOWEL  11-09-2014   @[facility]   . FRACTURE SURGERY N/A    Phreesia 10/29/2020  . JOINT REPLACEMENT N/A    Phreesia 10/29/2020  . LIPOMA EXCISION Right 09/16/2018   Procedure: EXCISION LIPOMA UPPER BACK;  Surgeon: Clovis Riley, MD;  Location: [place];  Service: General;  Laterality: Right;  . MASTECTOMY Left 2006   w/ reconstruction  on left (paget's disease)  and right breast augmentation  . POLYPECTOMY  05/02/2021   Procedure: POLYPECTOMY;  Surgeon: Harvel Quale, MD;  Location[facility];  Service: Gastroenterology;;  gastric  . SAVORY DILATION  05/02/2021   Procedure: SAVORY DILATION;  Surgeon: Harvel Quale, MD;  Location[facility];  Service: Gastroenterology;;  . SMALL INTESTINE SURGERY N/A    Phreesia 10/29/2020  . SPINE SURGERY N/A    Phreesia 10/29/2020  . TOTAL COLECTOMY  08-04-2002    @[facility]    AND CHOLECYSTECTOMY  (colonic inertia)  . TRANSTHORACIC ECHOCARDIOGRAM  02/04/2016   ef 60-65%,  grade 2 diastolic dysfunction/  mild MR  . TUBAL LIGATION N/A    Phreesia 03/16/2021  . VENA CAVA FILTER PLACEMENT  05/08/2005   @[facility]    [place] (non-retrievable)  . WIDE EXCISION PERIRECTAL ABSCESSES  09-22-2005   @ [facility]   Family History  Problem Relation Age of Onset  . Diabetes Mother   . Heart disease Mother   . Hypertension Mother   . Heart disease Father   . Hyperlipidemia Father   . Hypertension Father   . Alcohol abuse Father   . Colon cancer Maternal Aunt   . Breast cancer Maternal Aunt   . Cancer Maternal Uncle        mets  . Bone cancer Maternal Grandfather        mets  . Ovarian cancer Cousin 61  . Breast cancer Cousin   . Prostate cancer Maternal Uncle   . Breast cancer Maternal Aunt   . Brain cancer Maternal Aunt    Social History   Socioeconomic History  . Marital status: Married    Spouse name: Not on file  . Number of children: Not on file  . Years of education: Not on file  . Highest education level: Not on file  Occupational History  . Not on file  Tobacco Use  . Smoking status: Never Smoker  . Smokeless tobacco: Never Used  Vaping Use  . Vaping Use: Never used  Substance and Sexual Activity  . Alcohol use: No    Comment: twice per year  . Drug use: No  . Sexual activity: Yes    Birth control/protection: Surgical  Other Topics Concern  . Not on file  Social History Narrative  . Not on file   Social Determinants of Health   Financial Resource Strain: Medium Risk  . Difficulty of Paying Living  Expenses: Somewhat hard  Food Insecurity: No Food Insecurity  . Worried About Charity fundraiser in the Last Year: Never true  . Ran Out of Food in the Last Year: Never true  Transportation Needs: No Transportation Needs  . Lack of Transportation (Medical): No  . Lack of Transportation (Non-Medical): No  Physical Activity: Insufficiently Active  . Days of Exercise per Week: 7 days  . Minutes of Exercise per Session: 20 min  Stress: No Stress Concern Present  . Feeling of Stress : Not at all  Social Connections: Moderately Isolated  . Frequency of Communication with Friends and Family: More than three times a week  . Frequency of Social Gatherings with Friends and Family: More than three times a week  . Attends Religious Services: Never  . Active Member of Clubs or Organizations: No  . Attends Archivist Meetings: Never  . Marital Status: Married    Tobacco Counseling Counseling given: Not Answered   Clinical Intake:  Pre-visit preparation completed: Yes  Pain :  No/denies pain Pain Score: 7  Pain Type: Chronic pain Pain Location: Abdomen (back) Pain Descriptors / Indicators: Aching,Sharp,Constant Pain Onset: More than a month ago Pain Frequency: Constant     Nutritional Risks: Nausea/ vomitting/ diarrhea (Diarrhea) Diabetes: No  How often do you need to have someone help you when you read instructions, pamphlets, or other written materials from your doctor or pharmacy?: 1 - Never  Diabetic?No  Interpreter Needed?: No  Information entered by :: SCrews, LPN   Activities of Daily Living In your present state of health, do you have any difficulty performing the following activities: 05/14/2021 05/14/2021  Hearing? N N  Vision? N N  Difficulty concentrating or making decisions? N Y  Comment - has some concentration issues  Walking or climbing stairs? Y N  Comment - -  Dressing or bathing? N N  Doing errands, shopping? N N  Preparing Food and eating ? N  N  Using the Toilet? N N  In the past six months, have you accidently leaked urine? N N  Do you have problems with loss of bowel control? Y Y  Comment - has some loss of bowel control during sleep  Managing your Medications? N N  Managing your Finances? N N  Housekeeping or managing your Housekeeping? N N  Some recent data might be hidden    Patient Care Team: Fayrene Helper, MD as PCP - General Deboraha Sprang, MD as PCP - Cardiology (Cardiology) Selinda Eon (Inactive) as Referring Physician Ray, Debroah Loop, MD (Anesthesiology) Hennie Duos, MD as Consulting Physician (Rheumatology) Ray, Debroah Loop, MD as Referring Physician (Anesthesiology) Lovenia Shuck, MD as Referring Physician (Cardiothoracic Surgery)  Indicate any recent Medical Services you may have received from other than [facility] providers in the past year (date may be approximate).     Assessment:   This is a routine wellness examination for [name].  Hearing/Vision screen  Hearing Screening   125Hz  250Hz  500Hz  1000Hz  2000Hz  3000Hz  4000Hz  6000Hz  8000Hz   Right ear:           Left ear:           Vision Screening Comments: Patient states gets eye exams once every year. Currently wears glasses   Dietary issues and exercise activities discussed: Current Exercise Habits: Home exercise routine, Type of exercise: walking, Time (Minutes): 20, Frequency (Times/Week): 7, Weekly Exercise (Minutes/Week): 140, Intensity: Mild  Goals Addressed   None    Depression Screen PHQ 2/9 Scores 05/14/2021 05/14/2021 04/07/2021 10/31/2020 10/31/2020 10/22/2020 07/08/2020  PHQ - 2 Score 0 0 0 0 0 0 0  PHQ- 9 Score - - - - - 11 -    Fall Risk Fall Risk  05/14/2021 05/14/2021 04/07/2021 10/31/2020 10/31/2020  Falls in the past year? 1 1 0 1 1  Number falls in past yr: 0 0 0 1 1  Injury with Fall? 0 0 0 1 1  Risk for fall due to : No Fall Risks - No Fall Risks History of fall(s);Impaired balance/gait;Other (Comment) -  Follow up Falls  evaluation completed;Falls prevention discussed Falls evaluation completed;Falls prevention discussed Falls evaluation completed Education provided;Falls prevention discussed -    FALL RISK PREVENTION PERTAINING TO THE HOME:  Any stairs in or around the home? No  If so, are there any without handrails? No  Home free of loose throw rugs in walkways, pet beds, electrical cords, etc? Yes  Adequate lighting in your home to reduce risk of falls? Yes   ASSISTIVE DEVICES  UTILIZED TO PREVENT FALLS:  Life alert? No  Use of a cane, walker or w/c? No  Grab bars in the bathroom? No  Shower chair or bench in shower? No  Elevated toilet seat or a handicapped toilet? No   Cognitive Function:   Normal cognitive status assessed by direct observation by this Nurse Health Advisor. No abnormalities found.     6CIT Screen 10/31/2020 05/03/2019  What Year? 0 points 0 points  What month? 0 points 0 points  What time? 0 points 0 points  Count back from 20 0 points 0 points  Months in reverse - 0 points  Repeat phrase - 0 points  Total Score - 0    Immunizations Immunization History  Administered Date(s) Administered  . Hepatitis A 06/27/2009, 12/26/2009  . Hepatitis A, Adult 06/27/2009, 12/26/2009  . Hepatitis B 06/27/2009, 08/06/2009, 12/26/2009  . Hepatitis B, ped/adol 06/27/2009, 08/06/2009, 12/26/2009  . Influenza Split 09/15/2012  . Influenza Whole 09/13/2007, 10/01/2008, 09/09/2010, 09/23/2011  . Influenza, Seasonal, Injecte, Preservative Fre 10/08/2014  . Influenza,inj,Quad PF,6+ Mos 10/25/2013, 09/23/2015, 09/18/2016, 10/11/2017, 08/25/2018, 10/17/2019, 10/05/2020  . Influenza-Unspecified 09/15/2012, 09/27/2013  . Moderna Sars-Covid-2 Vaccination 03/18/2020, 04/22/2020  . Pneumococcal Polysaccharide-23 09/23/2011  . Td 10/27/2005  . Tdap 12/22/2016    TDAP status: Up to date  Flu Vaccine status: Up to date  Pneumococcal vaccine status: Up to date  Covid-19 vaccine status:  Completed vaccines  Qualifies for Shingles Vaccine? Yes   Zostavax completed No   Shingrix Completed?: No.    Education has been provided regarding the importance of this vaccine. Patient has been advised to call insurance company to determine out of pocket expense if they have not yet received this vaccine. Advised may also receive vaccine at local pharmacy or Health Dept. Verbalized acceptance and understanding.  Screening Tests Health Maintenance  Topic Date Due  . COVID-19 Vaccine (3 - Moderna risk 4-dose series) 04/07/2022 (Originally 05/20/2020)  . INFLUENZA VACCINE  07/28/2021  . MAMMOGRAM  04/08/2022  . PAP SMEAR-Modifier  02/26/2023  . TETANUS/TDAP  12/22/2026  . COLONOSCOPY (Pts 45-56yrs Insurance coverage will need to be confirmed)  05/03/2031  . Hepatitis C Screening  Completed  . HIV Screening  Completed  . HPV VACCINES  Aged Out    Health Maintenance  There are no preventive care reminders to display for this patient.  Colorectal cancer screening: Type of screening: Colonoscopy. Completed 05/02/2021. Repeat every 10 years  Mammogram status: Ordered 04/03/2021. Pt provided with contact info and advised to call to schedule appt.   Bone Density Status: Not due until age 69  Lung Cancer Screening: (Low Dose CT Chest recommended if Age 35-80 years, 30 pack-year currently smoking OR have quit w/in 15years.) does not qualify.   Lung Cancer Screening Referral: N/A  Additional Screening:  Hepatitis C Screening: does qualify; Completed 01/27/2016  Vision Screening: Recommended annual ophthalmology exams for early detection of glaucoma and other disorders of the eye. Is the patient up to date with their annual eye exam?  Yes  Who is the provider or what is the name of the office in which the patient attends annual eye exams? Dr. Jorja Loa If pt is not established with a provider, would they like to be referred to a provider to establish care? No .   Dental Screening:  Recommended annual dental exams for proper oral hygiene  Community Resource Referral / Chronic Care Management: CRR required this visit?  No   CCM required this visit?  No  Plan:     I have personally reviewed and noted the following in the patient's chart:   . Medical and social history . Use of alcohol, tobacco or illicit drugs  . Current medications and supplements including opioid prescriptions.  . Functional ability and status . Nutritional status . Physical activity . Advanced directives . List of other physicians . Hospitalizations, surgeries, and ER visits in previous 12 months . Vitals . Screenings to include cognitive, depression, and falls . Referrals and appointments  In addition, I have reviewed and discussed with patient certain preventive protocols, quality metrics, and best practice recommendations. A written personalized care plan for preventive services as well as general preventive health recommendations were provided to patient.     Barbara Neas, LPN   [id number]   Nurse Notes: None

## 2021-06-02 ENCOUNTER — Ambulatory Visit
Admission: RE | Admit: 2021-06-02 | Discharge: 2021-06-02 | Disposition: A | Discharge status: Home / Self Care | Payer: Medicare Other | Source: Ambulatory Visit | Attending: Family Medicine | Admitting: Family Medicine

## 2021-06-02 ENCOUNTER — Other Ambulatory Visit: Payer: Self-pay

## 2021-06-02 DIAGNOSIS — Z1231 Encounter for screening mammogram for malignant neoplasm of breast: Secondary | ICD-10-CM

## 2021-06-03 ENCOUNTER — Inpatient Hospital Stay (HOSPITAL_COMMUNITY): Payer: Medicare Other | Attending: Hematology

## 2021-06-03 ENCOUNTER — Other Ambulatory Visit: Payer: Self-pay

## 2021-06-03 DIAGNOSIS — D500 Iron deficiency anemia secondary to blood loss (chronic): Secondary | ICD-10-CM | POA: Diagnosis present

## 2021-06-03 LAB — CBC WITH DIFFERENTIAL/PLATELET
Abs Immature Granulocytes: 0.02 10*3/uL (ref 0.00–0.07)
Basophils Absolute: 0 10*3/uL (ref 0.0–0.1)
Basophils Relative: 1 %
Eosinophils Absolute: 0.1 10*3/uL (ref 0.0–0.5)
Eosinophils Relative: 2 %
HCT: 41.7 % (ref 36.0–46.0)
Hemoglobin: 13.9 g/dL (ref 12.0–15.0)
Immature Granulocytes: 0 %
Lymphocytes Relative: 38 %
Lymphs Abs: 2.2 10*3/uL (ref 0.7–4.0)
MCH: 30.5 pg (ref 26.0–34.0)
MCHC: 33.3 g/dL (ref 30.0–36.0)
MCV: 91.6 fL (ref 80.0–100.0)
Monocytes Absolute: 0.4 10*3/uL (ref 0.1–1.0)
Monocytes Relative: 7 %
Neutro Abs: 3.1 10*3/uL (ref 1.7–7.7)
Neutrophils Relative %: 52 %
Platelets: 157 10*3/uL (ref 150–400)
RBC: 4.55 MIL/uL (ref 3.87–5.11)
RDW: 13.3 % (ref 11.5–15.5)
WBC: 5.8 10*3/uL (ref 4.0–10.5)
nRBC: 0 % (ref 0.0–0.2)

## 2021-06-03 LAB — IRON AND TIBC
Iron: 101 ug/dL (ref 28–170)
Saturation Ratios: 35 % — ABNORMAL HIGH (ref 10.4–31.8)
TIBC: 291 ug/dL (ref 250–450)
UIBC: 190 ug/dL

## 2021-06-03 LAB — FERRITIN: Ferritin: 191 ng/mL (ref 11–307)

## 2021-06-04 ENCOUNTER — Ambulatory Visit (HOSPITAL_COMMUNITY): Payer: Medicare Other | Admitting: Hematology

## 2021-06-04 ENCOUNTER — Inpatient Hospital Stay (HOSPITAL_COMMUNITY): Payer: Medicare Other | Admitting: Hematology and Oncology

## 2021-06-06 ENCOUNTER — Other Ambulatory Visit: Payer: Self-pay | Admitting: Nurse Practitioner

## 2021-06-06 DIAGNOSIS — F411 Generalized anxiety disorder: Secondary | ICD-10-CM

## 2021-06-06 MED ORDER — ALPRAZOLAM 1 MG PO TABS
1.0000 mg | ORAL_TABLET | Freq: Two times a day (BID) | ORAL | 0 refills | Status: DC
Start: 2021-06-06 — End: 2021-08-24

## 2021-06-13 NOTE — Progress Notes (Deleted)
DELETE NOTE.

## 2021-06-16 ENCOUNTER — Inpatient Hospital Stay (HOSPITAL_COMMUNITY): Payer: Medicare Other | Admitting: Physician Assistant

## 2021-06-16 ENCOUNTER — Encounter (INDEPENDENT_AMBULATORY_CARE_PROVIDER_SITE_OTHER): Payer: Self-pay

## 2021-06-29 DIAGNOSIS — N390 Urinary tract infection, site not specified: Secondary | ICD-10-CM | POA: Diagnosis not present

## 2021-07-07 ENCOUNTER — Ambulatory Visit (INDEPENDENT_AMBULATORY_CARE_PROVIDER_SITE_OTHER): Payer: Medicare Other | Admitting: Gastroenterology

## 2021-07-07 ENCOUNTER — Telehealth: Payer: Self-pay | Admitting: *Deleted

## 2021-07-07 ENCOUNTER — Other Ambulatory Visit: Payer: Self-pay | Admitting: Family Medicine

## 2021-07-07 MED ORDER — ALPRAZOLAM 1 MG PO TABS: 1.0000 mg | ORAL_TABLET | Freq: Two times a day (BID) | ORAL | 1 refills | Status: DC

## 2021-07-07 NOTE — Telephone Encounter (Signed)
Pt is requesting refill on xanax to be sent to [facility]

## 2021-07-08 NOTE — Telephone Encounter (Signed)
LVM for pt to call the office.

## 2021-07-08 NOTE — Telephone Encounter (Signed)
Patient left voicemail returning call. Call back # [phone]

## 2021-07-09 NOTE — Telephone Encounter (Signed)
LVM if patient calls back please make sure she is aware of upcoming appointment in office and labs are due meds have been sent in for 2 months per provider

## 2021-07-09 NOTE — Telephone Encounter (Signed)
Patient left voicemail returning call at 12:40. Call back # [phone]

## 2021-07-09 NOTE — Telephone Encounter (Signed)
Patient informed. 

## 2021-07-17 DIAGNOSIS — Z6841 Body Mass Index (BMI) 40.0 and over, adult: Secondary | ICD-10-CM | POA: Diagnosis not present

## 2021-07-18 ENCOUNTER — Other Ambulatory Visit (HOSPITAL_COMMUNITY)
Admission: RE | Admit: 2021-07-18 | Discharge: 2021-07-18 | Disposition: A | Discharge status: Home / Self Care | Payer: Medicare Other | Source: Ambulatory Visit | Attending: Family Medicine | Admitting: Family Medicine

## 2021-07-18 ENCOUNTER — Other Ambulatory Visit: Payer: Self-pay

## 2021-07-18 ENCOUNTER — Ambulatory Visit (INDEPENDENT_AMBULATORY_CARE_PROVIDER_SITE_OTHER): Payer: Medicare Other | Admitting: Family Medicine

## 2021-07-18 ENCOUNTER — Encounter: Payer: Self-pay | Admitting: Family Medicine

## 2021-07-18 VITALS — BP 114/75 | HR 92 | Temp 99.6°F | Resp 18 | Ht 64.0 in | Wt 224.0 lb

## 2021-07-18 DIAGNOSIS — R319 Hematuria, unspecified: Secondary | ICD-10-CM | POA: Insufficient documentation

## 2021-07-18 DIAGNOSIS — R509 Fever, unspecified: Secondary | ICD-10-CM | POA: Insufficient documentation

## 2021-07-18 DIAGNOSIS — N3090 Cystitis, unspecified without hematuria: Secondary | ICD-10-CM | POA: Diagnosis not present

## 2021-07-18 DIAGNOSIS — R310 Gross hematuria: Secondary | ICD-10-CM | POA: Diagnosis not present

## 2021-07-18 DIAGNOSIS — E860 Dehydration: Secondary | ICD-10-CM

## 2021-07-18 DIAGNOSIS — N3000 Acute cystitis without hematuria: Secondary | ICD-10-CM

## 2021-07-18 DIAGNOSIS — N3010 Interstitial cystitis (chronic) without hematuria: Secondary | ICD-10-CM

## 2021-07-18 DIAGNOSIS — I10 Essential (primary) hypertension: Secondary | ICD-10-CM

## 2021-07-18 LAB — CBC WITH DIFFERENTIAL/PLATELET
Abs Immature Granulocytes: 0.02 10*3/uL (ref 0.00–0.07)
Basophils Absolute: 0 10*3/uL (ref 0.0–0.1)
Basophils Relative: 1 %
Eosinophils Absolute: 0.1 10*3/uL (ref 0.0–0.5)
Eosinophils Relative: 1 %
HCT: 43.6 % (ref 36.0–46.0)
Hemoglobin: 14.8 g/dL (ref 12.0–15.0)
Immature Granulocytes: 0 %
Lymphocytes Relative: 34 %
Lymphs Abs: 2.1 10*3/uL (ref 0.7–4.0)
MCH: 31.2 pg (ref 26.0–34.0)
MCHC: 33.9 g/dL (ref 30.0–36.0)
MCV: 91.8 fL (ref 80.0–100.0)
Monocytes Absolute: 0.4 10*3/uL (ref 0.1–1.0)
Monocytes Relative: 6 %
Neutro Abs: 3.6 10*3/uL (ref 1.7–7.7)
Neutrophils Relative %: 58 %
Platelets: 185 10*3/uL (ref 150–400)
RBC: 4.75 MIL/uL (ref 3.87–5.11)
RDW: 13.4 % (ref 11.5–15.5)
WBC: 6.2 10*3/uL (ref 4.0–10.5)
nRBC: 0 % (ref 0.0–0.2)

## 2021-07-18 LAB — POCT URINALYSIS DIP (CLINITEK)
Blood, UA: NEGATIVE
Glucose, UA: 100 mg/dL — AB
Nitrite, UA: POSITIVE — AB
POC PROTEIN,UA: 100 — AB
Spec Grav, UA: >=1.03 — AB (ref 1.010–1.025)
Urobilinogen, UA: 4 E.U./dL — AB
pH, UA: 5 (ref 5.0–8.0)

## 2021-07-18 LAB — BASIC METABOLIC PANEL
Anion gap: 8 (ref 5–15)
BUN: 14 mg/dL (ref 6–20)
CO2: 22 mmol/L (ref 22–32)
Calcium: 9.1 mg/dL (ref 8.9–10.3)
Chloride: 108 mmol/L (ref 98–111)
Creatinine, Ser: 0.91 mg/dL (ref 0.44–1.00)
GFR, Estimated: >60 mL/min (ref >60)
Glucose, Bld: 101 mg/dL — ABNORMAL HIGH (ref 70–99)
Potassium: 3.8 mmol/L (ref 3.5–5.1)
Sodium: 138 mmol/L (ref 135–145)

## 2021-07-18 MED ORDER — AMPICILLIN 500 MG PO CAPS: 500.0000 mg | ORAL_CAPSULE | Freq: Three times a day (TID) | ORAL | 0 refills | Status: DC

## 2021-07-18 MED ORDER — FLUCONAZOLE 150 MG PO TABS: ORAL_TABLET | ORAL | 0 refills | Status: DC

## 2021-07-18 NOTE — Progress Notes (Signed)
Urol   Halei Hanover     MRN: [id number]      DOB: [birth year]   HPI Ms. Cihlar is here reporting feeling ill with nausea, poor appetite and dysuria and  frequency since 06/27/2021. She has been on 3 courses of antibiotics, with no improvement, yesterday had gross hematuria, has severe stabbing pelvic pain, sates night time temp over 100 cotinuously, feels weak, skin extra sensitive, feels ill enough to be hospitalized as she states " something is wrong" ROS . Denies sinus pressure, nasal congestion, ear pain or sore throat. Denies chest congestion, productive cough or wheezing. Denies chest pains, palpitations and leg swelling . Denies uncontrolled  joint pain, swelling and limitation in mobility. Denies headaches, seizures, numbness, or tingling. Denies depression, anxiety or insomnia. Denies skin break down or rash.Reports hypersensitivity of skin   PE  BP 114/75 (BP Location: Right Arm, Patient Position: Sitting, Cuff Size: Large)   Pulse 92   Temp 99.6 F (37.6 C)   Resp 18   Ht _0  (1.626 m)   Wt 224 lb (101.6 kg)   SpO2 94%   BMI 38.45 kg/m   Patient alert and oriented and in no cardiopulmonary distress.  HEENT: No facial asymmetry, EOMI,     Neck supple .  Chest: Clear to auscultation bilaterally.  CVS: S1, S2 no murmurs, no S3.Regular rate.  ABD: no renal angle tenderness   Ext: No edema   Skin: Intact, no ulcerations or rash noted.  Psych: Good eye contact, normal affect. Memory intact  anxious not  depressed appearing.  CNS: CN 2-12 intact, power,  normal throughout.no focal deficits noted.   Assessment & Plan  Hematuria Reports gross henmaturia 1 day ago in the setting of 3 week h/o dysuria, frequency , intermittent chills and fever, failing 3 rounds of antibiotics, needs urology eval Baseline libs sent, CBC, chem 7 and EGFR CCUA and C/S  Essential hypertension Controlled, no change in medication DASH diet and commitment to daily  physical activity for a minimum of 30 minutes discussed and encouraged, as a part of hypertension management. The importance of attaining a healthy weight is also discussed.  BP/Weight 07/18/2021 05/14/2021 05/02/2021 04/30/2021 04/17/2021 6/65/9935 7/0/1779  Systolic BP 390 - 300 923 300 762 263  Diastolic BP 75 - 76 86 91 61 55  Wt. (Lbs) 224 - - 229 237 239 -  BMI 38.45 - - 39.31 40.05 40.39 -  Some encounter information is confidential and restricted. Go to Review Flowsheets activity to see all data.       Cystitis 3 week h/o dtysuria, frequency,pelvis pressure and pain, depite 3 courses of antibiotics , no improvement, send urine c/s and also blood c/s  Interstitial cystitis Possible flare, refer to urology for eval and management  Morbid obesity ([place]) Improved since joining weight loss program  Patient re-educated about  the importance of commitment to a  minimum of 150 minutes of exercise per week as able.  The importance of healthy food choices with portion control discussed, as well as eating regularly and within a 12 hour window most days. The need to choose "clean , green" food 50 to 75% of the time is discussed, as well as to make water the primary drink and set a goal of 64 ounces water daily.    Weight /BMI 07/18/2021 05/14/2021 04/30/2021  WEIGHT 224 lb - 229 lb  HEIGHT _1  - _2   BMI 38.45 kg/m2 - 39.31 kg/m2  Some encounter information  is confidential and restricted. Go to Review Flowsheets activity to see all data.

## 2021-07-18 NOTE — Patient Instructions (Addendum)
F/U in 3 weeks, call if you need me sooner  You appear to have urinary trat infection, despite 3 different antibiotics in recent times  5 day course of ampicillin  and fluconazole areprescribed  Stat labs today,CBC and diff, chem 7 and EGFR blood culture x 1  Urine is being sent for culture  You will be referred to Urology for evaluation also based on gross hematuria and persistent UTI  Thanks for choosing [facility], we consider it a privelige to serve you.

## 2021-07-20 ENCOUNTER — Encounter: Payer: Self-pay | Admitting: Family Medicine

## 2021-07-20 DIAGNOSIS — R509 Fever, unspecified: Secondary | ICD-10-CM | POA: Insufficient documentation

## 2021-07-20 LAB — URINE CULTURE

## 2021-07-20 NOTE — Assessment & Plan Note (Signed)
Improved since joining weight loss program  Patient re-educated about  the importance of commitment to a  minimum of 150 minutes of exercise per week as able.  The importance of healthy food choices with portion control discussed, as well as eating regularly and within a 12 hour window most days. The need to choose "clean , green" food 50 to 75% of the time is discussed, as well as to make water the primary drink and set a goal of 64 ounces water daily.    Weight /BMI 07/18/2021 05/14/2021 04/30/2021  WEIGHT 224 lb - 229 lb  HEIGHT '5\' 4"'$  - '5\' 4"'$   BMI 38.45 kg/m2 - 39.31 kg/m2  Some encounter information is confidential and restricted. Go to Review Flowsheets activity to see all data.

## 2021-07-20 NOTE — Assessment & Plan Note (Signed)
Possible flare, refer to urology for eval and management

## 2021-07-20 NOTE — Assessment & Plan Note (Signed)
3 week h/o dtysuria, frequency,pelvis pressure and pain, depite 3 courses of antibiotics , no improvement, send urine c/s and also blood c/s

## 2021-07-20 NOTE — Assessment & Plan Note (Signed)
Reports gross henmaturia 1 day ago in the setting of 3 week h/o dysuria, frequency , intermittent chills and fever, failing 3 rounds of antibiotics, needs urology eval Baseline libs sent, CBC, chem 7 and EGFR CCUA and C/S

## 2021-07-20 NOTE — Assessment & Plan Note (Signed)
Controlled, no change in medication DASH diet and commitment to daily physical activity for a minimum of 30 minutes discussed and encouraged, as a part of hypertension management. The importance of attaining a healthy weight is also discussed.  BP/Weight 07/18/2021 05/14/2021 05/02/2021 04/30/2021 04/17/2021 [id number]  Systolic BP [id number] - [id number] 0000000  Diastolic BP 75 - 76 86 91 61 55  Wt. (Lbs) 224 - - 229 237 239 -  BMI 38.45 - - 39.31 40.05 40.39 -  Some encounter information is confidential and restricted. Go to Review Flowsheets activity to see all data.

## 2021-07-21 ENCOUNTER — Other Ambulatory Visit: Payer: Self-pay

## 2021-07-23 DIAGNOSIS — G43909 Migraine, unspecified, not intractable, without status migrainosus: Secondary | ICD-10-CM | POA: Diagnosis not present

## 2021-07-23 DIAGNOSIS — R252 Cramp and spasm: Secondary | ICD-10-CM | POA: Diagnosis not present

## 2021-07-23 DIAGNOSIS — Z79891 Long term (current) use of opiate analgesic: Secondary | ICD-10-CM | POA: Diagnosis not present

## 2021-07-23 DIAGNOSIS — M545 Low back pain, unspecified: Secondary | ICD-10-CM | POA: Diagnosis not present

## 2021-07-23 LAB — CULTURE, BLOOD (SINGLE): Culture: NO GROWTH

## 2021-07-24 ENCOUNTER — Other Ambulatory Visit (HOSPITAL_COMMUNITY): Payer: Self-pay

## 2021-07-24 DIAGNOSIS — R790 Abnormal level of blood mineral: Secondary | ICD-10-CM

## 2021-07-24 DIAGNOSIS — D500 Iron deficiency anemia secondary to blood loss (chronic): Secondary | ICD-10-CM

## 2021-07-24 DIAGNOSIS — D509 Iron deficiency anemia, unspecified: Secondary | ICD-10-CM

## 2021-07-25 ENCOUNTER — Inpatient Hospital Stay (HOSPITAL_COMMUNITY): Payer: Medicare Other

## 2021-07-29 ENCOUNTER — Ambulatory Visit (HOSPITAL_COMMUNITY): Payer: Medicare Other | Admitting: Physician Assistant

## 2021-08-03 ENCOUNTER — Encounter (HOSPITAL_COMMUNITY): Payer: Self-pay

## 2021-08-05 ENCOUNTER — Inpatient Hospital Stay (HOSPITAL_COMMUNITY): Payer: Medicare Other

## 2021-08-05 ENCOUNTER — Encounter (HOSPITAL_COMMUNITY): Payer: Self-pay

## 2021-08-07 ENCOUNTER — Encounter (HOSPITAL_COMMUNITY): Payer: Self-pay

## 2021-08-07 ENCOUNTER — Telehealth (HOSPITAL_COMMUNITY): Payer: Medicare Other | Admitting: Physician Assistant

## 2021-08-11 ENCOUNTER — Ambulatory Visit: Payer: Medicare Other | Admitting: Urology

## 2021-08-12 ENCOUNTER — Encounter: Payer: Self-pay | Admitting: Family Medicine

## 2021-08-15 ENCOUNTER — Ambulatory Visit: Payer: Medicare Other | Admitting: Family Medicine

## 2021-08-18 ENCOUNTER — Other Ambulatory Visit: Payer: Self-pay | Admitting: Family Medicine

## 2021-08-19 ENCOUNTER — Other Ambulatory Visit: Payer: Self-pay | Admitting: Family Medicine

## 2021-08-24 ENCOUNTER — Other Ambulatory Visit: Payer: Self-pay | Admitting: Nurse Practitioner

## 2021-08-24 ENCOUNTER — Other Ambulatory Visit: Payer: Self-pay | Admitting: Family Medicine

## 2021-08-24 DIAGNOSIS — F411 Generalized anxiety disorder: Secondary | ICD-10-CM

## 2021-08-25 ENCOUNTER — Telehealth: Payer: Self-pay

## 2021-08-25 NOTE — Telephone Encounter (Signed)
Patient called need med refill  levothyroxine (SYNTHROID) 125 MCG tablet  Pharmacy: [facility]

## 2021-08-26 ENCOUNTER — Other Ambulatory Visit: Payer: Self-pay

## 2021-08-26 DIAGNOSIS — E038 Other specified hypothyroidism: Secondary | ICD-10-CM

## 2021-08-26 MED ORDER — LEVOTHYROXINE SODIUM 125 MCG PO TABS: ORAL_TABLET | ORAL | 1 refills | Status: DC

## 2021-08-26 MED ORDER — ALPRAZOLAM 1 MG PO TABS: 1.0000 mg | ORAL_TABLET | Freq: Two times a day (BID) | ORAL | 0 refills | Status: DC

## 2021-08-26 NOTE — Telephone Encounter (Signed)
Refilled

## 2021-09-03 ENCOUNTER — Other Ambulatory Visit: Payer: Self-pay | Admitting: Internal Medicine

## 2021-09-10 ENCOUNTER — Other Ambulatory Visit: Payer: Self-pay

## 2021-09-10 DIAGNOSIS — E785 Hyperlipidemia, unspecified: Secondary | ICD-10-CM

## 2021-09-10 DIAGNOSIS — I10 Essential (primary) hypertension: Secondary | ICD-10-CM

## 2021-09-11 ENCOUNTER — Inpatient Hospital Stay (HOSPITAL_COMMUNITY): Payer: Medicare Other | Attending: Hematology

## 2021-09-11 ENCOUNTER — Other Ambulatory Visit (HOSPITAL_COMMUNITY)
Admission: RE | Admit: 2021-09-11 | Discharge: 2021-09-11 | Disposition: A | Discharge status: Home / Self Care | Payer: Medicare Other | Source: Ambulatory Visit | Attending: Family Medicine | Admitting: Family Medicine

## 2021-09-11 ENCOUNTER — Other Ambulatory Visit: Payer: Self-pay

## 2021-09-11 DIAGNOSIS — Z8614 Personal history of Methicillin resistant Staphylococcus aureus infection: Secondary | ICD-10-CM | POA: Insufficient documentation

## 2021-09-11 DIAGNOSIS — Z833 Family history of diabetes mellitus: Secondary | ICD-10-CM | POA: Insufficient documentation

## 2021-09-11 DIAGNOSIS — Z79899 Other long term (current) drug therapy: Secondary | ICD-10-CM | POA: Diagnosis not present

## 2021-09-11 DIAGNOSIS — Z7901 Long term (current) use of anticoagulants: Secondary | ICD-10-CM | POA: Diagnosis not present

## 2021-09-11 DIAGNOSIS — R197 Diarrhea, unspecified: Secondary | ICD-10-CM | POA: Insufficient documentation

## 2021-09-11 DIAGNOSIS — E785 Hyperlipidemia, unspecified: Secondary | ICD-10-CM | POA: Diagnosis not present

## 2021-09-11 DIAGNOSIS — Z86711 Personal history of pulmonary embolism: Secondary | ICD-10-CM | POA: Insufficient documentation

## 2021-09-11 DIAGNOSIS — Z8042 Family history of malignant neoplasm of prostate: Secondary | ICD-10-CM | POA: Insufficient documentation

## 2021-09-11 DIAGNOSIS — E538 Deficiency of other specified B group vitamins: Secondary | ICD-10-CM | POA: Diagnosis not present

## 2021-09-11 DIAGNOSIS — D509 Iron deficiency anemia, unspecified: Secondary | ICD-10-CM

## 2021-09-11 DIAGNOSIS — Z8349 Family history of other endocrine, nutritional and metabolic diseases: Secondary | ICD-10-CM | POA: Insufficient documentation

## 2021-09-11 DIAGNOSIS — K219 Gastro-esophageal reflux disease without esophagitis: Secondary | ICD-10-CM | POA: Insufficient documentation

## 2021-09-11 DIAGNOSIS — R790 Abnormal level of blood mineral: Secondary | ICD-10-CM

## 2021-09-11 DIAGNOSIS — Z803 Family history of malignant neoplasm of breast: Secondary | ICD-10-CM | POA: Insufficient documentation

## 2021-09-11 DIAGNOSIS — Z596 Low income: Secondary | ICD-10-CM | POA: Insufficient documentation

## 2021-09-11 DIAGNOSIS — Z8673 Personal history of transient ischemic attack (TIA), and cerebral infarction without residual deficits: Secondary | ICD-10-CM | POA: Diagnosis not present

## 2021-09-11 DIAGNOSIS — Z808 Family history of malignant neoplasm of other organs or systems: Secondary | ICD-10-CM | POA: Insufficient documentation

## 2021-09-11 DIAGNOSIS — I10 Essential (primary) hypertension: Secondary | ICD-10-CM | POA: Insufficient documentation

## 2021-09-11 DIAGNOSIS — M549 Dorsalgia, unspecified: Secondary | ICD-10-CM | POA: Diagnosis not present

## 2021-09-11 DIAGNOSIS — Z853 Personal history of malignant neoplasm of breast: Secondary | ICD-10-CM | POA: Insufficient documentation

## 2021-09-11 DIAGNOSIS — R200 Anesthesia of skin: Secondary | ICD-10-CM | POA: Diagnosis not present

## 2021-09-11 DIAGNOSIS — E669 Obesity, unspecified: Secondary | ICD-10-CM | POA: Insufficient documentation

## 2021-09-11 DIAGNOSIS — D500 Iron deficiency anemia secondary to blood loss (chronic): Secondary | ICD-10-CM

## 2021-09-11 DIAGNOSIS — Z800 Family history of malignant neoplasm of digestive organs: Secondary | ICD-10-CM | POA: Diagnosis not present

## 2021-09-11 DIAGNOSIS — R5383 Other fatigue: Secondary | ICD-10-CM | POA: Diagnosis not present

## 2021-09-11 DIAGNOSIS — Z8249 Family history of ischemic heart disease and other diseases of the circulatory system: Secondary | ICD-10-CM | POA: Insufficient documentation

## 2021-09-11 DIAGNOSIS — Z811 Family history of alcohol abuse and dependence: Secondary | ICD-10-CM | POA: Diagnosis not present

## 2021-09-11 LAB — CBC WITH DIFFERENTIAL/PLATELET
Abs Immature Granulocytes: 0.02 10*3/uL (ref 0.00–0.07)
Basophils Absolute: 0 10*3/uL (ref 0.0–0.1)
Basophils Relative: 1 %
Eosinophils Absolute: 0.1 10*3/uL (ref 0.0–0.5)
Eosinophils Relative: 2 %
HCT: 46.5 % — ABNORMAL HIGH (ref 36.0–46.0)
Hemoglobin: 15.3 g/dL — ABNORMAL HIGH (ref 12.0–15.0)
Immature Granulocytes: 0 %
Lymphocytes Relative: 31 %
Lymphs Abs: 1.9 10*3/uL (ref 0.7–4.0)
MCH: 30.2 pg (ref 26.0–34.0)
MCHC: 32.9 g/dL (ref 30.0–36.0)
MCV: 91.7 fL (ref 80.0–100.0)
Monocytes Absolute: 0.4 10*3/uL (ref 0.1–1.0)
Monocytes Relative: 7 %
Neutro Abs: 3.6 10*3/uL (ref 1.7–7.7)
Neutrophils Relative %: 59 %
Platelets: 194 10*3/uL (ref 150–400)
RBC: 5.07 MIL/uL (ref 3.87–5.11)
RDW: 13.2 % (ref 11.5–15.5)
WBC: 6.1 10*3/uL (ref 4.0–10.5)
nRBC: 0 % (ref 0.0–0.2)

## 2021-09-11 LAB — COMPREHENSIVE METABOLIC PANEL
ALT: 23 U/L (ref 0–44)
AST: 23 U/L (ref 15–41)
Albumin: 4.2 g/dL (ref 3.5–5.0)
Alkaline Phosphatase: 88 U/L (ref 38–126)
Anion gap: 9 (ref 5–15)
BUN: 13 mg/dL (ref 6–20)
CO2: 27 mmol/L (ref 22–32)
Calcium: 9.4 mg/dL (ref 8.9–10.3)
Chloride: 103 mmol/L (ref 98–111)
Creatinine, Ser: 0.91 mg/dL (ref 0.44–1.00)
GFR, Estimated: >60 mL/min (ref >60)
Glucose, Bld: 99 mg/dL (ref 70–99)
Potassium: 4.2 mmol/L (ref 3.5–5.1)
Sodium: 139 mmol/L (ref 135–145)
Total Bilirubin: 0.5 mg/dL (ref 0.3–1.2)
Total Protein: 6.9 g/dL (ref 6.5–8.1)

## 2021-09-11 LAB — IRON AND TIBC
Iron: 65 ug/dL (ref 28–170)
Saturation Ratios: 21 % (ref 10.4–31.8)
TIBC: 316 ug/dL (ref 250–450)
UIBC: 251 ug/dL

## 2021-09-11 LAB — LIPID PANEL
Cholesterol: 127 mg/dL (ref 0–200)
HDL: 37 mg/dL — ABNORMAL LOW (ref >40)
LDL Cholesterol: 34 mg/dL (ref 0–99)
Total CHOL/HDL Ratio: 3.4 RATIO
Triglycerides: 278 mg/dL — ABNORMAL HIGH (ref <150)
VLDL: 56 mg/dL — ABNORMAL HIGH (ref 0–40)

## 2021-09-11 LAB — FERRITIN: Ferritin: 148 ng/mL (ref 11–307)

## 2021-09-11 LAB — TSH: TSH: 2.101 u[IU]/mL (ref 0.350–4.500)

## 2021-09-15 ENCOUNTER — Ambulatory Visit: Payer: Medicare Other | Admitting: Family Medicine

## 2021-09-17 NOTE — Progress Notes (Signed)
[name], [place] [id number]   CLINIC:  Medical Oncology/Hematology  PCP:  Fayrene Helper, MD [address] [place] [address] [phone]   REASON FOR VISIT:  Follow-up for iron deficiency anemia  CURRENT THERAPY: IV iron (last INFED on 03/28/2021)  INTERVAL HISTORY:  [name] 60 y.o. female returns for routine follow-up of her iron deficiency anemia.  She was last contacted by Dr. Delton Coombes on 03/04/2021 via telephone visit.  [name] has a history of iron deficiency status secondary to multiple bowel resections and malabsorption.  She also has history of Paget's disease of the left breast which was resected with mastectomy in 2006.  She also had unprovoked pulmonary embolism in 1997 and is on Xarelto.  At her last visit in March 2022, she reported bleeding per rectum on and off for the last 2 weeks from hemorrhoids.  She also had complained of baseline diarrhea, which was stable.  Patient was given IV Venofer on 02/18/2021, but had a reaction.  She received INFED infusion on 03/28/2021, which she tolerated after premedication was given.  At today's visit, she reports feeling fair, apart from some fatigue.   She reports improved energy after IV iron infusion in April, reports that it lasted for several months before she began to feel fatigued again.  She reports that she does not sleep well at night due to chronic pain.  She continues to take Xarelto due to her history of unprovoked pulmonary embolisms.  She is tolerating it well without major bleeding events.  She denies any epistaxis, hematemesis, hematochezia, or melena.  She denies leg swelling, pain, and erythema.  No shortness of breath, dyspnea on exertion, chest pain, cough, hemoptysis, or palpitations.  She denies any new breast lumps or lymphadenopathy.  No B symptoms such as fever, chills, unintentional weight loss.  She has 60% energy and 100% appetite. She has  been losing weight intentionally by diet and exercise.   REVIEW OF SYSTEMS:  Review of Systems  Constitutional:  Positive for fatigue. Negative for appetite change, chills, diaphoresis, fever and unexpected weight change.  HENT:   Positive for trouble swallowing (d/t dry mouth). Negative for lump/mass and nosebleeds.   Eyes:  Negative for eye problems.  Respiratory:  Negative for cough, hemoptysis and shortness of breath.   Cardiovascular:  Positive for chest pain (none today). Negative for leg swelling and palpitations.  Gastrointestinal:  Positive for diarrhea. Negative for abdominal pain, blood in stool, constipation, nausea and vomiting.  Genitourinary:  Negative for hematuria.   Musculoskeletal:  Positive for back pain.  Skin: Negative.   Neurological:  Positive for numbness (tingling in right hand). Negative for dizziness, headaches and light-headedness.  Hematological:  Does not bruise/bleed easily.     PAST MEDICAL/SURGICAL HISTORY:  Past Medical History:  Diagnosis Date   Abdominal hernia 08/14/2019   Abdominal pain 06/06/2019   Acute bronchitis 05/01/2013   Acute cystitis 06/09/2010   Qualifier: Diagnosis of  By: Cori Razor LPN, Brandi     Acute renal insufficiency 08/28/2011   Allergy    Phreesia 03/16/2021   Anemia    Phreesia 10/29/2020   Anxiety    Anxiety state 03/16/2008   Qualifier: Diagnosis of  By: Dierdre Harness     Arthritis    Phreesia 10/29/2020   Back pain with radiation 07/17/2014   Bilateral hand pain 07/27/2016   Blood transfusion without reported diagnosis    Phreesia 10/29/2020  Breast cancer ([place])    L breast- 2006   Cancer [facility])    Phreesia 10/29/2020   Carpal tunnel syndrome    Chronic constipation    Chronic pain syndrome    followed by [facility]---  back   Clotting disorder ([place])    Phreesia 10/29/2020   Depression    Dermatitis 12/15/2011   Difficult intubation    Educated about COVID-19 virus infection 05/14/2019   Fall at home  01/26/2016   Family history of adverse reaction to anesthesia    MOTHER--- PONV   Fatigue 09/17/2012   Fibromyalgia    FIBROMYALGIA 03/16/2008   Qualifier: Diagnosis of  By: Dierdre Harness     GERD (gastroesophageal reflux disease)    Headache disorder 01/16/2013   Headache syndrome 01/26/2016   Hip pain, right 07/08/2019   History of MRSA infection    lip abscess   History of ovarian cyst 06/2011   s/p  BSO   History of pulmonary embolus (PE) 1997   post EP with ablation pulmonary veouns for SVT/ Atrial Fib.   History of supraventricular tachycardia    s/p  ablation 1996  and 1997  by dr Caryl Comes   History of TIA (transient ischemic attack) 1997   post op EP ablation PE   Hyperlipidemia    Hyperlipidemia LDL goal <100 03/16/2008   Qualifier: Diagnosis of  By: Dierdre Harness     Hypothyroidism    followed by pcp   Incisional hernia    Insomnia 12/15/2011   Intermittent palpitations 08/22/2017   Interstitial cystitis    09-13-2018   per pt last flare-up  May 2019 (followed by pcp)   INTERSTITIAL CYSTITIS 02/17/2011   Qualifier: Diagnosis of  By: Moshe Cipro MD, Margaret     Iron deficiency anemia    09-13-2018  PER PT STABLE   Irregular heart rate 07/25/2013   Lipoma of back    upper   Low back pain radiating to right leg 07/04/2019   Low ferritin level 06/14/2014   MDD (major depressive disorder), single episode, in full remission ([place]) 10/27/2017   Medically noncompliant 02/28/2015   Multiple missed appointments, both follow-up appointments and lab appointments.    Metabolic syndrome X 0/27/7412   Qualifier: Diagnosis of  By: Moshe Cipro MD, Margaret  hBA1c is 5.8 in 02/2013    Migraines    Morbid obesity ([place]) 03/27/2013   Muscle spasm 01/03/2020   Nausea alone 07/17/2014   NECK PAIN, CHRONIC 03/16/2008   Qualifier: Diagnosis of  By: Dierdre Harness     Normal coronary arteries    a. by CT 12/2018.   Obesity 03/16/2008   Qualifier: Diagnosis of  By: Dierdre Harness     Oral ulceration 08/23/2014    Presented at 06/14/2014 visit    Other malaise and fatigue 03/16/2008   Centricity Description: FATIGUE, CHRONIC Qualifier: Diagnosis of  By: Dierdre Harness   Centricity Description: FATIGUE Qualifier: Diagnosis of  By: Claybon Jabs PA, Dawn     OVARIAN CYST 12/26/2009   Qualifier: Diagnosis of  By: Moshe Cipro MD, Margaret     Paget disease of breast, left [facility])    Paget's disease of breast, left ([place]) 03/16/2008   Qualifier: Diagnosis of  By: Dierdre Harness  Left diagnosed in 2006 F/h breast cancer x 15 family members   Partial small bowel obstruction ([place]) 07/04/2012   PONV (postoperative nausea and vomiting)    SEVERE   Postsurgical menopause 11/13/2011   Presence of IVC filter 06/06/2019  PSVT (paroxysmal supraventricular tachycardia) ([place])    [place]   Recurrent oral herpes simplex infection 10/13/2017   ROM (right otitis media) 01/23/2013   S/P insertion of IVC (inferior vena caval) filter 05/08/2005   [place] (non-retrievable)  /  dx 2019  a leg of filter is protruding thru the vena cava in to right L2 vertebral body (09-13-2018  per pt having surgery to remove filter in [place])   S/P radiofrequency ablation operation for arrhythmia 1996   1996  and 1997,   SVT and Atrial Fib   Sinusitis, chronic 01/26/2016   SMALL BOWEL OBSTRUCTION, HX OF 08/07/2008   Annotation: obstruction w/ adhesions led to partial colectomy Qualifier: Diagnosis of  By: Craige Cotta     Stroke [facility])    [place] 10/29/2020, TIAs in the past   Swelling of hand 09/17/2012   Syncope    Tachycardia 02/03/2010   Qualifier: Diagnosis of  By: Via LPN, Lynn     Thyroid disease    Phreesia 10/29/2020   Ulcer 12/15/2011   Urinary frequency 07/18/2014   Vaginitis and vulvovaginitis 05/10/2013   Vitamin D deficiency 05/05/2015   Wears glasses    Past Surgical History:  Procedure Laterality Date   ABDOMINAL HYSTERECTOMY  1987   ANTERIOR CERVICAL DECOMP/DISCECTOMY FUSION  03-07-2002   dr elsner  @[facility]    C 4  -- 5   APPENDECTOMY  1980   AUGMENTATION MAMMAPLASTY Right 2006   BILATERAL SALPINGOOPHORECTOMY  07/24/2011   via Explor. Lap. w/ intraoperative perf. bowel repair   BIOPSY  05/02/2021   Procedure: BIOPSY;  Surgeon: Montez Morita, Quillian Quince, MD;  [facility];  Service: Gastroenterology;;  small bowel, mid esophagus, distal esophagus, random colon biopsies   BREAST BIOPSY Right 2019   benign   BREAST ENHANCEMENT SURGERY Bilateral 1993   BREAST IMPLANT REMOVAL Bilateral    BREAST SURGERY N/A    Phreesia 10/29/2020   CARDIAC CATHETERIZATION  07-06-2003   dr Darnell Level brodie   normal coronaries and LVF   CARDIAC ELECTROPHYSIOLOGY STUDY AND ABLATION  1996  and 1997   CARDIOVASCULAR STRESS TEST  11-18-2015   dr Caryl Comes   normal nuclear study w/ no ischemia/  normal LV function and wall motion , ef 84%   CARPAL TUNNEL RELEASE Right ?   CESAREAN SECTION  1986   CESAREAN SECTION N/A    Phreesia 10/29/2020   CHOLECYSTECTOMY N/A    Phreesia 10/29/2020   COLON SURGERY     COLONOSCOPY WITH PROPOFOL N/A 05/02/2021   Procedure: COLONOSCOPY WITH PROPOFOL;  Surgeon: Harvel Quale, MD;  [facility];  Service: Gastroenterology;  Laterality: N/A;  12:30 PM   CYSTO/  HYDRODISTENTION/  INSTILATION THERAPY  MULTIPLE   ENTEROCUTANEOUS FISTULA CLOSURE  multiple   last one 2015 with small bowel resection   ESOPHAGOGASTRODUODENOSCOPY (EGD) WITH PROPOFOL N/A 05/02/2021   Procedure: ESOPHAGOGASTRODUODENOSCOPY (EGD) WITH PROPOFOL;  Surgeon: Harvel Quale, MD;  [facility];  Service: Gastroenterology;  Laterality: N/A;   EXPLORATORY LAPAROTOMY INCISIONAL VENTRAL HERNIA REPAIR / RESECTION SMALL BOWEL  11-09-2014   @[facility]    FRACTURE SURGERY N/A    Phreesia 10/29/2020   JOINT REPLACEMENT N/A    Phreesia 10/29/2020   LIPOMA EXCISION Right 09/16/2018   Procedure: EXCISION LIPOMA UPPER BACK;  Surgeon: Clovis Riley, MD;  Location: [place];   Service: General;  Laterality: Right;   MASTECTOMY Left 2006   w/ reconstruction on left (paget's  disease)  and right breast augmentation   POLYPECTOMY  05/02/2021   Procedure: POLYPECTOMY;  Surgeon: Montez Morita, Quillian Quince, MD;  [facility];  Service: Gastroenterology;;  gastric   SAVORY DILATION  05/02/2021   Procedure: SAVORY DILATION;  Surgeon: Montez Morita, Quillian Quince, MD;  [facility];  Service: Gastroenterology;;   SMALL INTESTINE SURGERY N/A    Phreesia 10/29/2020   SPINE SURGERY N/A    Phreesia 10/29/2020   TOTAL COLECTOMY  08-04-2002    @[facility]    AND CHOLECYSTECTOMY  (colonic inertia)   TRANSTHORACIC ECHOCARDIOGRAM  02/04/2016   ef 60-65%,  grade 2 diastolic dysfunction/  mild MR   TUBAL LIGATION N/A    Phreesia 03/16/2021   VENA CAVA FILTER PLACEMENT  05/08/2005   @[facility]    [place] (non-retrievable)   WIDE EXCISION PERIRECTAL ABSCESSES  09-22-2005   @ [facility]     SOCIAL HISTORY:  Social History   Socioeconomic History   Marital status: Married    Spouse name: Not on file   Number of children: Not on file   Years of education: Not on file   Highest education level: Not on file  Occupational History   Not on file  Tobacco Use   Smoking status: Never   Smokeless tobacco: Never  Vaping Use   Vaping Use: Never used  Substance and Sexual Activity   Alcohol use: No    Comment: twice per year   Drug use: No   Sexual activity: Yes    Birth control/protection: Surgical  Other Topics Concern   Not on file  Social History Narrative   Not on file   Social Determinants of Health   Financial Resource Strain: Medium Risk   Difficulty of Paying Living Expenses: Somewhat hard  Food Insecurity: No Food Insecurity   Worried About Charity fundraiser in the Last Year: Never true   Ran Out of Food in the Last Year: Never true  Transportation Needs: No Transportation Needs   Lack of Transportation (Medical): No   Lack of Transportation (Non-Medical):  No  Physical Activity: Insufficiently Active   Days of Exercise per Week: 7 days   Minutes of Exercise per Session: 20 min  Stress: No Stress Concern Present   Feeling of Stress : Not at all  Social Connections: Moderately Isolated   Frequency of Communication with Friends and Family: More than three times a week   Frequency of Social Gatherings with Friends and Family: More than three times a week   Attends Religious Services: Never   Marine scientist or Organizations: No   Attends Music therapist: Never   Marital Status: Married  Human resources officer Violence: Not At Risk   Fear of Current or Ex-Partner: No   Emotionally Abused: No   Physically Abused: No   Sexually Abused: No    FAMILY HISTORY:  Family History  Problem Relation Age of Onset   Diabetes Mother    Heart disease Mother    Hypertension Mother    Heart disease Father    Hyperlipidemia Father    Hypertension Father    Alcohol abuse Father    Colon cancer Maternal Aunt    Breast cancer Maternal Aunt    Cancer Maternal Uncle        mets   Bone cancer Maternal Grandfather        mets   Ovarian cancer Cousin 19   Breast cancer Cousin    Prostate cancer Maternal Uncle  Breast cancer Maternal Aunt    Brain cancer Maternal Aunt    Breast cancer Maternal Aunt     CURRENT MEDICATIONS:  Outpatient Encounter Medications as of 09/18/2021  Medication Sig Note   ALPRAZolam (XANAX) 1 MG tablet Take 1 tablet (1 mg total) by mouth 2 (two) times daily.    ALPRAZolam (XANAX) 1 MG tablet Take 1 tablet (1 mg total) by mouth 2 (two) times daily.    ampicillin (PRINCIPEN) 500 MG capsule Take 1 capsule (500 mg total) by mouth 3 (three) times daily.    aspirin 81 MG chewable tablet Chew 81 mg by mouth daily.    butalbital-acetaminophen-caffeine (FIORICET) 50-325-40 MG tablet Take 1 tablet by mouth as directed. 10/31/2020: Takes at onset of a migraine, on avg 2 per month, up to 3 tabs per episode   Calcium  Carbonate-Vitamin D (CALCIUM-VITAMIN D) 600-125 MG-UNIT TABS Take 1 tablet by mouth at bedtime. Nature made Bone Health    cholecalciferol (VITAMIN D3) 25 MCG (1000 UNIT) tablet Take 1,000 Units by mouth at bedtime.    conjugated estrogens (PREMARIN) vaginal cream Place fingertip amount intravaginally 2 to 3 times per week (Patient taking differently: Place 0.1 mg vaginally at bedtime. Place fingertip amount intravaginally 2 to 3 times per week)    cyanocobalamin (,VITAMIN B-12,) 1000 MCG/ML injection Inject 1,000 mcg into the muscle every 30 (thirty) days.    cyclobenzaprine (FLEXERIL) 10 MG tablet TAKE 1 TABLET BY MOUTH TWICE DAILY AS NEEDED FOR MUSCLE SPASMS (Patient taking differently: Take 10 mg by mouth in the morning and at bedtime.)    cycloSPORINE (RESTASIS) 0.05 % ophthalmic emulsion Place 1 drop into both eyes 2 (two) times daily as needed (dry eyes).    Dextran 70-Hypromellose (ARTIFICIAL TEARS) 0.1-0.3 % SOLN Place 1 drop into both eyes daily as needed (dry eyes).    DULoxetine (CYMBALTA) 60 MG capsule Take 60 mg by mouth daily.    ergocalciferol (VITAMIN D2) 1.25 MG (50000 UT) capsule Take 50,000 Units by mouth every Wednesday.    ezetimibe (ZETIA) 10 MG tablet Take 1 tablet (10 mg total) by mouth daily.    fluconazole (DIFLUCAN) 150 MG tablet Take one tablet once daily, as needed, fopr vaginal yeast infection    fluticasone (FLONASE) 50 MCG/ACT nasal spray Place 2 sprays into both nostrils daily. (Patient taking differently: Place 2 sprays into both nostrils daily as needed for allergies.)    HYDROmorphone (DILAUDID) 4 MG tablet Take 4 mg by mouth in the morning, at noon, and at bedtime.    Krill Oil 1000 MG CAPS Take 1,000 mg by mouth at bedtime.    levothyroxine (SYNTHROID) 125 MCG tablet TAKE 1 TABLET(125 MCG) BY MOUTH DAILY BEFORE BREAKFAST    magnesium oxide (MAG-OX) 400 (240 Mg) MG tablet Take 1 tablet (400 mg total) by mouth 2 (two) times daily. Please make overdue appt with Dr.  Caryl Comes before anymore refills. Thank you 1st attempt    meclizine (ANTIVERT) 25 MG tablet Take 1 tablet (25 mg total) by mouth 3 (three) times daily as needed for dizziness.    metoprolol tartrate (LOPRESSOR) 25 MG tablet Take 1 tablet (25 mg total) by mouth 2 (two) times daily. (Patient taking differently: Take 25 mg by mouth 2 (two) times daily as needed (Afib).)    Multiple Vitamins-Minerals (ICAPS AREDS 2) CAPS Take 1 capsule by mouth at bedtime.    omeprazole (PRILOSEC) 40 MG capsule TAKE 1 CAPSULE BY MOUTH EVERY DAY    pravastatin (PRAVACHOL)  40 MG tablet TAKE 1 TABLET(40 MG) BY MOUTH DAILY (Patient taking differently: Take 40 mg by mouth daily. TAKE 1 TABLET(40 MG) BY MOUTH DAILY)    senna-docusate (SENOKOT-S) 8.6-50 MG per tablet Take 2 tablets by mouth 2 (two) times daily. constipation    Specialty Vitamins Products (VITAMINS FOR HAIR PO) Take 1 capsule by mouth daily.    vitamin C (ASCORBIC ACID) 500 MG tablet Take 500 mg by mouth at bedtime.    XARELTO 20 MG TABS tablet TAKE 1 TABLET(20 MG) BY MOUTH DAILY (Patient taking differently: Take 20 mg by mouth daily with supper.) 05/01/2021: Last dose 04/29/2021 on hold for proedure.   No facility-administered encounter medications on file as of 09/18/2021.    ALLERGIES:  Allergies  Allergen Reactions   Nitrofurantoin Hives   Crestor [Rosuvastatin Calcium] Other (See Comments)    Generalized cramps   Bisacodyl Other (See Comments)    Makes patient feel like she is having cramps Makes patient feel like she is having cramps   Clarithromycin Hives and Other (See Comments)    Other reaction(s): Other (See Comments) Cluster migraines  Cluster migraines   Clarithromycin Other (See Comments)    Cluster migraines    Clindamycin Hcl Hives   Codeine Itching   Iron Sucrose Other (See Comments)    Flushing- required benadryl and solu medrol   Monosodium Glutamate Other (See Comments)    Cluster migraines  Cluster migraines   Scopolamine Hbr  Other (See Comments)    Cluster migraines, impaired vision     PHYSICAL EXAM:  ECOG PERFORMANCE STATUS: 1 - Symptomatic but completely ambulatory  There were no vitals filed for this visit. There were no vitals filed for this visit. Physical Exam Constitutional:      Appearance: Normal appearance. She is obese.  HENT:     Head: Normocephalic and atraumatic.     Mouth/Throat:     Mouth: Mucous membranes are moist.  Eyes:     Extraocular Movements: Extraocular movements intact.     Pupils: Pupils are equal, round, and reactive to light.  Cardiovascular:     Rate and Rhythm: Normal rate and regular rhythm.     Pulses: Normal pulses.     Heart sounds: Normal heart sounds.  Pulmonary:     Effort: Pulmonary effort is normal.     Breath sounds: Normal breath sounds.  Abdominal:     General: Bowel sounds are normal.     Palpations: Abdomen is soft.     Tenderness: There is no abdominal tenderness.  Musculoskeletal:        General: No swelling.     Right lower leg: No edema.     Left lower leg: No edema.  Lymphadenopathy:     Cervical: No cervical adenopathy.  Skin:    General: Skin is warm and dry.  Neurological:     General: No focal deficit present.     Mental Status: She is alert and oriented to person, place, and time.  Psychiatric:        Mood and Affect: Mood normal.        Behavior: Behavior normal.     LABORATORY DATA:  I have reviewed the labs as listed.  CBC    Component Value Date/Time   WBC 6.1 09/11/2021 1330   RBC 5.07 09/11/2021 1330   HGB 15.3 (H) 09/11/2021 1330   HGB 14.0 11/18/2018 0328   HCT 46.5 (H) 09/11/2021 1330   HCT 40.6 11/18/2018 0328  PLT 194 09/11/2021 1330   PLT 177 11/18/2018 0328   MCV 91.7 09/11/2021 1330   MCV 88 11/18/2018 0328   MCH 30.2 09/11/2021 1330   MCHC 32.9 09/11/2021 1330   RDW 13.2 09/11/2021 1330   RDW 12.4 11/18/2018 0328   LYMPHSABS 1.9 09/11/2021 1330   MONOABS 0.4 09/11/2021 1330   EOSABS 0.1 09/11/2021  1330   BASOSABS 0.0 09/11/2021 1330   CMP Latest Ref Rng & Units 09/11/2021 07/18/2021 04/30/2021  Glucose 70 - 99 mg/dL 99 101(H) 102(H)  BUN 6 - 20 mg/dL 13 14 17   Creatinine 0.44 - 1.00 mg/dL 0.91 0.91 0.82  Sodium 135 - 145 mmol/L 139 138 140  Potassium 3.5 - 5.1 mmol/L 4.2 3.8 3.7  Chloride 98 - 111 mmol/L 103 108 104  CO2 22 - 32 mmol/L 27 22 28   Calcium 8.9 - 10.3 mg/dL 9.4 9.1 8.9  Total Protein 6.5 - 8.1 g/dL 6.9 - 6.4(L)  Total Bilirubin 0.3 - 1.2 mg/dL 0.5 - 0.6  Alkaline Phos 38 - 126 U/L 88 - 97  AST 15 - 41 U/L 23 - 23  ALT 0 - 44 U/L 23 - 24    DIAGNOSTIC IMAGING:  I have independently reviewed the relevant imaging and discussed with the patient.  ASSESSMENT & PLAN: 1.  Iron deficiency anemia: - History of iron deficiency status secondary to multiple bowel resections and malabsorption. -Previous work-up (02/13/2021) showed normal LDH.  Folic acid and J19, copper and methylmalonic acid were normal.  SPEP was negative.  CA 15-3 and TSH were also normal. - Most recent EGD (05/02/2021) showed single gastric polyp, otherwise normal - Most recent colonoscopy (05/02/2021) was limited by liquid stool in the colon, showed nonbleeding internal hemorrhoids - Patient was given IV Venofer on 02/18/2021 but had a reaction.  She received INFeD infusion on 03/28/2021, which she tolerated after premedication was given. - Most recent labs (09/11/2021) show Hgb 15.3, ferritin 148, iron saturation 21% - PLAN: No indication for IV iron at this time.  Repeat labs and phone visit in 3 months    2.  Unprovoked pulmonary embolism: - Most recent D-dimer is negative.  No current signs or symptoms of DVT/PE. - PLAN: Continue Xarelto.   3.  History of Paget's disease - History of Paget's disease of the left breast which was resected with mastectomy in 2006. - Most recent mammogram 06/02/2021: BI-RADS Category 1 negative - She reports that she has been having some breast itching for the past month, and is  planning to follow-up at her established breast center. - PLAN: Continue annual mammograms via breast center.  4.  B12 deficiency secondary to intestinal malabsorption - She has been self-administering B12 injections - PLAN: Refill for vitamin B12 injections has been sent to pharmacy.  We will check nutritional panel at next visit.   PLAN SUMMARY & DISPOSITION: -Labs and RTC with phone visit in 3 months   All questions were answered. The patient knows to call the clinic with any problems, questions or concerns.  Medical decision making: Low  Time spent on visit: I spent 15 minutes counseling the patient face to face. The total time spent in the appointment was 25 minutes and more than 50% was on counseling.   Harriett Rush, PA-C  09/18/2021 3:39 PM

## 2021-09-18 ENCOUNTER — Other Ambulatory Visit: Payer: Self-pay

## 2021-09-18 ENCOUNTER — Inpatient Hospital Stay (HOSPITAL_COMMUNITY): Payer: Medicare Other | Admitting: Physician Assistant

## 2021-09-18 DIAGNOSIS — E538 Deficiency of other specified B group vitamins: Secondary | ICD-10-CM

## 2021-09-18 DIAGNOSIS — R200 Anesthesia of skin: Secondary | ICD-10-CM | POA: Diagnosis not present

## 2021-09-18 DIAGNOSIS — I10 Essential (primary) hypertension: Secondary | ICD-10-CM | POA: Diagnosis not present

## 2021-09-18 DIAGNOSIS — M549 Dorsalgia, unspecified: Secondary | ICD-10-CM | POA: Diagnosis not present

## 2021-09-18 DIAGNOSIS — Z8673 Personal history of transient ischemic attack (TIA), and cerebral infarction without residual deficits: Secondary | ICD-10-CM | POA: Diagnosis not present

## 2021-09-18 DIAGNOSIS — K912 Postsurgical malabsorption, not elsewhere classified: Secondary | ICD-10-CM | POA: Diagnosis not present

## 2021-09-18 DIAGNOSIS — R5383 Other fatigue: Secondary | ICD-10-CM | POA: Diagnosis not present

## 2021-09-18 DIAGNOSIS — Z7901 Long term (current) use of anticoagulants: Secondary | ICD-10-CM | POA: Diagnosis not present

## 2021-09-18 DIAGNOSIS — K219 Gastro-esophageal reflux disease without esophagitis: Secondary | ICD-10-CM | POA: Diagnosis not present

## 2021-09-18 DIAGNOSIS — D509 Iron deficiency anemia, unspecified: Secondary | ICD-10-CM | POA: Diagnosis not present

## 2021-09-18 DIAGNOSIS — R197 Diarrhea, unspecified: Secondary | ICD-10-CM | POA: Diagnosis not present

## 2021-09-18 DIAGNOSIS — E785 Hyperlipidemia, unspecified: Secondary | ICD-10-CM | POA: Diagnosis not present

## 2021-09-18 DIAGNOSIS — E669 Obesity, unspecified: Secondary | ICD-10-CM | POA: Diagnosis not present

## 2021-09-18 DIAGNOSIS — Z853 Personal history of malignant neoplasm of breast: Secondary | ICD-10-CM | POA: Diagnosis not present

## 2021-09-18 DIAGNOSIS — Z86711 Personal history of pulmonary embolism: Secondary | ICD-10-CM | POA: Diagnosis not present

## 2021-09-18 DIAGNOSIS — Z79899 Other long term (current) drug therapy: Secondary | ICD-10-CM | POA: Diagnosis not present

## 2021-09-18 DIAGNOSIS — Z8614 Personal history of Methicillin resistant Staphylococcus aureus infection: Secondary | ICD-10-CM | POA: Diagnosis not present

## 2021-09-18 MED ORDER — CYANOCOBALAMIN 1000 MCG/ML IJ SOLN: 1000.0000 ug | INTRAMUSCULAR | 11 refills | Status: DC

## 2021-09-18 NOTE — Patient Instructions (Signed)
[name] at [facility] Discharge Instructions  You were seen today by Barbara Abernethy PA-C for your iron deficiency anemia.  Your blood and iron levels looks great!  You do not need any IV iron at this time.  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  This is also why you are low on B12 and other nutrients.  A refill prescription for B12 injections has been sent to your pharmacy, so that you can continue self administering monthly injections at home.  Regarding her history of Paget's disease of the breast, if you continue to have any abnormal breast symptoms, please contact the breast center you follow with for further testing and work-up.  Continue to take Xarelto due to your history of pulmonary embolisms.  LABS: Return in 3 months for repeat labs  OTHER TESTS: No other tests at this time  MEDICATIONS: Continue vitamin D supplement.  Continue B12 monthly injections.  FOLLOW-UP APPOINTMENT: Phone visit in 3 months after labs   Thank you for choosing [place] at [facility] to provide your oncology and hematology care.  To afford each patient quality time with our provider, please arrive at least 15 minutes before your scheduled appointment time.   If you have a lab appointment with the [facility] please come in thru the Main Entrance and check in at the main information desk.  You need to re-schedule your appointment should you arrive 10 or more minutes late.  We strive to give you quality time with our providers, and arriving late affects you and other patients whose appointments are after yours.  Also, if you no show three or more times for appointments you may be dismissed from the clinic at the providers discretion.     Again, thank you for choosing [facility].  Our hope is that these requests will decrease the amount of time that you wait before being  seen by our physicians.       _____________________________________________________________  Should you have questions after your visit to [facility], please contact our office at [phone] and follow the prompts.  Our office hours are 8:00 a.m. and 4:30 p.m. Monday - Friday.  Please note that voicemails left after 4:00 p.m. may not be returned until the following business day.  We are closed weekends and major holidays.  You do have access to a nurse 24-7, just call the main number to the clinic [phone] and do not press any options, hold on the line and a nurse will answer the phone.    For prescription refill requests, have your pharmacy contact our office and allow 72 hours.    Due to Covid, you will need to wear a mask upon entering the hospital. If you do not have a mask, a mask will be given to you at the Main Entrance upon arrival. For doctor visits, patients may have 1 support person age 39 or older with them. For treatment visits, patients can not have anyone with them due to social distancing guidelines and our immunocompromised population.

## 2021-09-19 ENCOUNTER — Encounter: Payer: Self-pay | Admitting: Family Medicine

## 2021-09-19 ENCOUNTER — Ambulatory Visit (INDEPENDENT_AMBULATORY_CARE_PROVIDER_SITE_OTHER): Payer: Medicare Other | Admitting: Family Medicine

## 2021-09-19 VITALS — BP 128/83 | HR 90 | Temp 98.4°F | Ht 64.0 in | Wt 209.0 lb

## 2021-09-19 DIAGNOSIS — Z23 Encounter for immunization: Secondary | ICD-10-CM

## 2021-09-19 DIAGNOSIS — I10 Essential (primary) hypertension: Secondary | ICD-10-CM

## 2021-09-19 DIAGNOSIS — G894 Chronic pain syndrome: Secondary | ICD-10-CM

## 2021-09-19 DIAGNOSIS — E559 Vitamin D deficiency, unspecified: Secondary | ICD-10-CM

## 2021-09-19 DIAGNOSIS — F4322 Adjustment disorder with anxiety: Secondary | ICD-10-CM

## 2021-09-19 DIAGNOSIS — E038 Other specified hypothyroidism: Secondary | ICD-10-CM | POA: Diagnosis not present

## 2021-09-19 DIAGNOSIS — E785 Hyperlipidemia, unspecified: Secondary | ICD-10-CM | POA: Diagnosis not present

## 2021-09-19 MED ORDER — ALPRAZOLAM 1 MG PO TABS: 1.0000 mg | ORAL_TABLET | Freq: Two times a day (BID) | ORAL | 3 refills | Status: AC | PRN

## 2021-09-19 NOTE — Patient Instructions (Addendum)
Annual exam in office with MD in 4 months, call if you need me sooner  Flu vaccine today  Reduce fat in diet  Nurse please send 90 day supply with 1 refill on thyroid med  Nurse pls get and document shingrix vaccines form [facility] in [place]  Fasting lipid, cmp and eGFR, tSH and vit D 5 days before next visit  CONGRATS on excellent weight loss , keep it up!  Thanks for choosing [facility], we consider it a privelige to serve you.

## 2021-09-22 ENCOUNTER — Encounter: Payer: Self-pay | Admitting: Family Medicine

## 2021-09-22 NOTE — Assessment & Plan Note (Signed)
Managed through pain clinic 

## 2021-09-22 NOTE — Assessment & Plan Note (Signed)
Controlled, no change in medication  

## 2021-09-22 NOTE — Assessment & Plan Note (Signed)
Controlled, no change in medication DASH diet and commitment to daily physical activity for a minimum of 30 minutes discussed and encouraged, as a part of hypertension management. The importance of attaining a healthy weight is also discussed.  BP/Weight 09/19/2021 07/18/2021 05/14/2021 05/02/2021 04/30/2021 04/17/2021 1/61/0960  Systolic BP 454 098 - 119 147 829 562  Diastolic BP 83 75 - 76 86 91 61  Wt. (Lbs) 209 224 - - 229 237 239  BMI 35.87 38.45 - - 39.31 40.05 40.39  Some encounter information is confidential and restricted. Go to Review Flowsheets activity to see all data.

## 2021-09-22 NOTE — Assessment & Plan Note (Addendum)
Hyperlipidemia:Low fat diet discussed and encouraged.  Consider adding zetia  Lipid Panel  Lab Results  Component Value Date   CHOL 127 09/11/2021   HDL 37 (L) 09/11/2021   LDLCALC 34 09/11/2021   LDLDIRECT 138 (H) 09/15/2012   TRIG 278 (H) 09/11/2021   CHOLHDL 3.4 09/11/2021     Needs to lower fat intake

## 2021-09-22 NOTE — Progress Notes (Signed)
[name]     MRN: [id number]      DOB: [birth year]   HPI [name] is here for follow up and re-evaluation of chronic medical conditions, medication management and review of any available recent lab and radiology data.  Preventive health is updated, specifically  Cancer screening and Immunization.   Excellent results with weight loss clinic, and exercising regularly.   Lives with chronic pain which she is working through   ROS Denies recent fever or chills. Denies sinus pressure, nasal congestion, ear pain or sore throat. Denies chest congestion, productive cough or wheezing. Denies chest pains, palpitations and leg swelling Denies abdominal pain, nausea, vomiting,diarrhea or constipation.   Denies dysuria, frequency, hesitancy or incontinence. Denies joint pain, swelling and limitation in mobility. Denies headaches, seizures, numbness, or tingling. Denies depression, anxiety or insomnia. Denies skin break down or rash.   PE  BP 128/83 (BP Location: Right Arm, Patient Position: Sitting, Cuff Size: Large)   Pulse 90   Temp 98.4 F (36.9 C) (Oral)   Ht 5\' 4"  (1.626 m)   Wt 209 lb (94.8 kg)   SpO2 93%   BMI 35.87 kg/m   Patient alert and oriented and in no cardiopulmonary distress.  HEENT: No facial asymmetry, EOMI,     Neck supple .  Chest: Clear to auscultation bilaterally.  CVS: S1, S2 no murmurs, no S3.Regular rate.  ABD: Soft non tender.   Ext: No edema  MS: Adequate ROM spine, shoulders, hips and knees.  Skin: Intact, no ulcerations or rash noted.  Psych: Good eye contact, normal affect. Memory intact not anxious or depressed appearing.  CNS: CN 2-12 intact, power,  normal throughout.no focal deficits noted.   Assessment & Plan  Adjustment disorder with anxiety Controlled, no change in medication   Chronic pain syndrome Managed through pain clinic  Essential hypertension Controlled, no change in medication DASH diet and commitment  to daily physical activity for a minimum of 30 minutes discussed and encouraged, as a part of hypertension management. The importance of attaining a healthy weight is also discussed.  BP/Weight 09/19/2021 07/18/2021 05/14/2021 05/02/2021 04/30/2021 04/17/2021 06/12/736  Systolic BP 106 269 - 485 462 703 500  Diastolic BP 83 75 - 76 86 91 61  Wt. (Lbs) 209 224 - - 229 237 239  BMI 35.87 38.45 - - 39.31 40.05 40.39  Some encounter information is confidential and restricted. Go to Review Flowsheets activity to see all data.       Morbid obesity ([place]) Marked improvement through weight management clinic  Patient re-educated about  the importance of commitment to a  minimum of 150 minutes of exercise per week as able.  The importance of healthy food choices with portion control discussed, as well as eating regularly and within a 12 hour window most days. The need to choose "clean , green" food 50 to 75% of the time is discussed, as well as to make water the primary drink and set a goal of 64 ounces water daily.    Weight /BMI 09/19/2021 07/18/2021 05/14/2021  WEIGHT 209 lb 224 lb -  HEIGHT 5\' 4"  5\' 4"  -  BMI 35.87 kg/m2 38.45 kg/m2 -  Some encounter information is confidential and restricted. Go to Review Flowsheets activity to see all data.      Hypothyroidism Controlled, no change in medication   Hyperlipidemia LDL goal <100 Hyperlipidemia:Low fat diet discussed and encouraged.  Consider adding zetia  Lipid Panel  Lab Results  Component  Value Date   CHOL 127 09/11/2021   HDL 37 (L) 09/11/2021   LDLCALC 34 09/11/2021   LDLDIRECT 138 (H) 09/15/2012   TRIG 278 (H) 09/11/2021   CHOLHDL 3.4 09/11/2021     Needs to lower fat intake

## 2021-09-22 NOTE — Assessment & Plan Note (Signed)
Marked improvement through weight management clinic  Patient re-educated about  the importance of commitment to a  minimum of 150 minutes of exercise per week as able.  The importance of healthy food choices with portion control discussed, as well as eating regularly and within a 12 hour window most days. The need to choose "clean , green" food 50 to 75% of the time is discussed, as well as to make water the primary drink and set a goal of 64 ounces water daily.    Weight /BMI 09/19/2021 07/18/2021 05/14/2021  WEIGHT 209 lb 224 lb -  HEIGHT 5\' 4"  5\' 4"  -  BMI 35.87 kg/m2 38.45 kg/m2 -  Some encounter information is confidential and restricted. Go to Review Flowsheets activity to see all data.

## 2021-09-28 ENCOUNTER — Other Ambulatory Visit: Payer: Self-pay | Admitting: Family Medicine

## 2021-09-28 MED ORDER — EZETIMIBE 10 MG PO TABS: 10.0000 mg | ORAL_TABLET | Freq: Every day | ORAL | 3 refills | Status: DC

## 2021-09-28 NOTE — Progress Notes (Signed)
[facility]ias[facility] to [facility] in [place]

## 2021-10-03 ENCOUNTER — Other Ambulatory Visit: Payer: Self-pay | Admitting: Internal Medicine

## 2021-10-03 DIAGNOSIS — Z86711 Personal history of pulmonary embolism: Secondary | ICD-10-CM

## 2021-10-12 ENCOUNTER — Encounter: Payer: Self-pay | Admitting: Family Medicine

## 2021-10-13 DIAGNOSIS — M545 Low back pain, unspecified: Secondary | ICD-10-CM | POA: Diagnosis not present

## 2021-10-13 DIAGNOSIS — R252 Cramp and spasm: Secondary | ICD-10-CM | POA: Diagnosis not present

## 2021-10-13 DIAGNOSIS — Z79891 Long term (current) use of opiate analgesic: Secondary | ICD-10-CM | POA: Diagnosis not present

## 2021-10-13 DIAGNOSIS — G43909 Migraine, unspecified, not intractable, without status migrainosus: Secondary | ICD-10-CM | POA: Diagnosis not present

## 2021-10-15 DIAGNOSIS — I10 Essential (primary) hypertension: Secondary | ICD-10-CM | POA: Diagnosis not present

## 2021-10-15 DIAGNOSIS — Z86711 Personal history of pulmonary embolism: Secondary | ICD-10-CM | POA: Diagnosis not present

## 2021-10-15 DIAGNOSIS — F411 Generalized anxiety disorder: Secondary | ICD-10-CM | POA: Diagnosis not present

## 2021-10-16 ENCOUNTER — Ambulatory Visit: Payer: Medicare Other | Admitting: Family Medicine

## 2021-10-17 ENCOUNTER — Telehealth: Payer: Self-pay

## 2021-10-17 DIAGNOSIS — N390 Urinary tract infection, site not specified: Secondary | ICD-10-CM | POA: Diagnosis not present

## 2021-10-17 NOTE — Telephone Encounter (Signed)
Patient called asking if referral be put back in to neurologist she canceled this appointment earlier due to her mother was in the hospital. Call back # [phone]

## 2021-10-20 ENCOUNTER — Other Ambulatory Visit: Payer: Self-pay

## 2021-10-20 DIAGNOSIS — M62838 Other muscle spasm: Secondary | ICD-10-CM

## 2021-10-20 NOTE — Telephone Encounter (Signed)
Order placed

## 2021-10-25 DIAGNOSIS — K219 Gastro-esophageal reflux disease without esophagitis: Secondary | ICD-10-CM | POA: Diagnosis not present

## 2021-10-25 DIAGNOSIS — Z7901 Long term (current) use of anticoagulants: Secondary | ICD-10-CM | POA: Diagnosis not present

## 2021-10-25 DIAGNOSIS — K56600 Partial intestinal obstruction, unspecified as to cause: Secondary | ICD-10-CM | POA: Diagnosis not present

## 2021-10-25 DIAGNOSIS — Z8041 Family history of malignant neoplasm of ovary: Secondary | ICD-10-CM | POA: Diagnosis not present

## 2021-10-25 DIAGNOSIS — Z808 Family history of malignant neoplasm of other organs or systems: Secondary | ICD-10-CM | POA: Diagnosis not present

## 2021-10-25 DIAGNOSIS — Z20822 Contact with and (suspected) exposure to covid-19: Secondary | ICD-10-CM | POA: Diagnosis not present

## 2021-10-25 DIAGNOSIS — M797 Fibromyalgia: Secondary | ICD-10-CM | POA: Diagnosis not present

## 2021-10-25 DIAGNOSIS — I10 Essential (primary) hypertension: Secondary | ICD-10-CM | POA: Diagnosis not present

## 2021-10-25 DIAGNOSIS — R0789 Other chest pain: Secondary | ICD-10-CM | POA: Diagnosis not present

## 2021-10-25 DIAGNOSIS — R109 Unspecified abdominal pain: Secondary | ICD-10-CM | POA: Diagnosis not present

## 2021-10-25 DIAGNOSIS — R112 Nausea with vomiting, unspecified: Secondary | ICD-10-CM | POA: Diagnosis not present

## 2021-10-25 DIAGNOSIS — R10817 Generalized abdominal tenderness: Secondary | ICD-10-CM | POA: Diagnosis not present

## 2021-10-25 DIAGNOSIS — M545 Low back pain, unspecified: Secondary | ICD-10-CM | POA: Diagnosis not present

## 2021-10-25 DIAGNOSIS — E039 Hypothyroidism, unspecified: Secondary | ICD-10-CM | POA: Diagnosis not present

## 2021-10-25 DIAGNOSIS — E7800 Pure hypercholesterolemia, unspecified: Secondary | ICD-10-CM | POA: Diagnosis not present

## 2021-10-25 DIAGNOSIS — R059 Cough, unspecified: Secondary | ICD-10-CM | POA: Diagnosis not present

## 2021-10-25 DIAGNOSIS — R1084 Generalized abdominal pain: Secondary | ICD-10-CM | POA: Diagnosis not present

## 2021-10-25 DIAGNOSIS — Z803 Family history of malignant neoplasm of breast: Secondary | ICD-10-CM | POA: Diagnosis not present

## 2021-10-25 DIAGNOSIS — Z86711 Personal history of pulmonary embolism: Secondary | ICD-10-CM | POA: Diagnosis not present

## 2021-11-10 ENCOUNTER — Telehealth (INDEPENDENT_AMBULATORY_CARE_PROVIDER_SITE_OTHER): Payer: Self-pay

## 2021-11-10 NOTE — Telephone Encounter (Signed)
Patient states understanding regarding all of the below.   Patient called today stating she recently was in hospital at [place] due to a bile blockage on 10/25/2021. She started last night with having to have a bm Q 5 minutes and when she has a bm it is yellow in color, she is also refluxing bile and has a sore throat . She has no gallbladder. She states she hear loud bowel sounds and no fever has some abdominal pains just prior to bowel movements, no blood seen in stools or vomitus. She states she took Omeprazole 40 mg two last night and want to know if she can take more. Discussed with Dr. Jenetta Downer made aware of all and that we have an opening tomorrow with [facility] at 11 am. Per Dr. Jenetta Downer patient can have the appointment tomorrow here, but if worsens she will need to go back to hospital. She should not take any more omeprazole. She can take 40 mg Q 12 hours, since she already met this for today she may take pepcid one tonight for symptoms.

## 2021-11-11 ENCOUNTER — Emergency Department (HOSPITAL_COMMUNITY): Payer: Medicare Other

## 2021-11-11 ENCOUNTER — Encounter (INDEPENDENT_AMBULATORY_CARE_PROVIDER_SITE_OTHER): Payer: Self-pay | Admitting: Gastroenterology

## 2021-11-11 ENCOUNTER — Other Ambulatory Visit: Payer: Self-pay

## 2021-11-11 ENCOUNTER — Ambulatory Visit (INDEPENDENT_AMBULATORY_CARE_PROVIDER_SITE_OTHER): Payer: Medicare Other | Admitting: Gastroenterology

## 2021-11-11 ENCOUNTER — Encounter (HOSPITAL_COMMUNITY): Payer: Self-pay | Admitting: *Deleted

## 2021-11-11 ENCOUNTER — Inpatient Hospital Stay (HOSPITAL_COMMUNITY)
Admission: EM | Admit: 2021-11-11 | Discharge: 2021-11-18 | DRG: 392 | Disposition: A | Discharge status: Home / Self Care | Payer: Medicare Other | Attending: Internal Medicine | Admitting: Internal Medicine

## 2021-11-11 VITALS — BP 113/81 | HR 106 | Temp 98.0°F | Ht 64.0 in | Wt 188.2 lb

## 2021-11-11 DIAGNOSIS — Z86711 Personal history of pulmonary embolism: Secondary | ICD-10-CM

## 2021-11-11 DIAGNOSIS — F909 Attention-deficit hyperactivity disorder, unspecified type: Secondary | ICD-10-CM | POA: Diagnosis not present

## 2021-11-11 DIAGNOSIS — E860 Dehydration: Secondary | ICD-10-CM | POA: Diagnosis present

## 2021-11-11 DIAGNOSIS — Z803 Family history of malignant neoplasm of breast: Secondary | ICD-10-CM

## 2021-11-11 DIAGNOSIS — T402X5A Adverse effect of other opioids, initial encounter: Secondary | ICD-10-CM | POA: Diagnosis present

## 2021-11-11 DIAGNOSIS — F419 Anxiety disorder, unspecified: Secondary | ICD-10-CM | POA: Diagnosis not present

## 2021-11-11 DIAGNOSIS — K529 Noninfective gastroenteritis and colitis, unspecified: Secondary | ICD-10-CM | POA: Diagnosis not present

## 2021-11-11 DIAGNOSIS — I10 Essential (primary) hypertension: Secondary | ICD-10-CM | POA: Diagnosis not present

## 2021-11-11 DIAGNOSIS — Z86718 Personal history of other venous thrombosis and embolism: Secondary | ICD-10-CM

## 2021-11-11 DIAGNOSIS — Z9049 Acquired absence of other specified parts of digestive tract: Secondary | ICD-10-CM

## 2021-11-11 DIAGNOSIS — Z800 Family history of malignant neoplasm of digestive organs: Secondary | ICD-10-CM | POA: Diagnosis not present

## 2021-11-11 DIAGNOSIS — R197 Diarrhea, unspecified: Secondary | ICD-10-CM | POA: Diagnosis not present

## 2021-11-11 DIAGNOSIS — R1033 Periumbilical pain: Secondary | ICD-10-CM | POA: Diagnosis not present

## 2021-11-11 DIAGNOSIS — Z808 Family history of malignant neoplasm of other organs or systems: Secondary | ICD-10-CM

## 2021-11-11 DIAGNOSIS — M797 Fibromyalgia: Secondary | ICD-10-CM | POA: Diagnosis present

## 2021-11-11 DIAGNOSIS — R1114 Bilious vomiting: Secondary | ICD-10-CM | POA: Diagnosis not present

## 2021-11-11 DIAGNOSIS — Z8719 Personal history of other diseases of the digestive system: Secondary | ICD-10-CM | POA: Diagnosis not present

## 2021-11-11 DIAGNOSIS — K9189 Other postprocedural complications and disorders of digestive system: Secondary | ICD-10-CM | POA: Diagnosis not present

## 2021-11-11 DIAGNOSIS — E119 Type 2 diabetes mellitus without complications: Secondary | ICD-10-CM | POA: Diagnosis not present

## 2021-11-11 DIAGNOSIS — Z7901 Long term (current) use of anticoagulants: Secondary | ICD-10-CM

## 2021-11-11 DIAGNOSIS — R1084 Generalized abdominal pain: Secondary | ICD-10-CM | POA: Diagnosis not present

## 2021-11-11 DIAGNOSIS — K3184 Gastroparesis: Principal | ICD-10-CM | POA: Diagnosis present

## 2021-11-11 DIAGNOSIS — W19XXXA Unspecified fall, initial encounter: Secondary | ICD-10-CM | POA: Diagnosis present

## 2021-11-11 DIAGNOSIS — Z8673 Personal history of transient ischemic attack (TIA), and cerebral infarction without residual deficits: Secondary | ICD-10-CM

## 2021-11-11 DIAGNOSIS — E039 Hypothyroidism, unspecified: Secondary | ICD-10-CM | POA: Diagnosis present

## 2021-11-11 DIAGNOSIS — E876 Hypokalemia: Secondary | ICD-10-CM | POA: Diagnosis not present

## 2021-11-11 DIAGNOSIS — R55 Syncope and collapse: Secondary | ICD-10-CM | POA: Diagnosis present

## 2021-11-11 DIAGNOSIS — R220 Localized swelling, mass and lump, head: Secondary | ICD-10-CM | POA: Diagnosis not present

## 2021-11-11 DIAGNOSIS — K838 Other specified diseases of biliary tract: Secondary | ICD-10-CM

## 2021-11-11 DIAGNOSIS — R531 Weakness: Secondary | ICD-10-CM | POA: Diagnosis not present

## 2021-11-11 DIAGNOSIS — E785 Hyperlipidemia, unspecified: Secondary | ICD-10-CM | POA: Diagnosis present

## 2021-11-11 DIAGNOSIS — M25551 Pain in right hip: Secondary | ICD-10-CM | POA: Diagnosis present

## 2021-11-11 DIAGNOSIS — Z6832 Body mass index (BMI) 32.0-32.9, adult: Secondary | ICD-10-CM

## 2021-11-11 DIAGNOSIS — G894 Chronic pain syndrome: Secondary | ICD-10-CM | POA: Diagnosis not present

## 2021-11-11 DIAGNOSIS — Z853 Personal history of malignant neoplasm of breast: Secondary | ICD-10-CM

## 2021-11-11 DIAGNOSIS — R1110 Vomiting, unspecified: Secondary | ICD-10-CM

## 2021-11-11 DIAGNOSIS — R933 Abnormal findings on diagnostic imaging of other parts of digestive tract: Secondary | ICD-10-CM

## 2021-11-11 DIAGNOSIS — M545 Low back pain, unspecified: Secondary | ICD-10-CM | POA: Diagnosis present

## 2021-11-11 DIAGNOSIS — Z8041 Family history of malignant neoplasm of ovary: Secondary | ICD-10-CM | POA: Diagnosis not present

## 2021-11-11 DIAGNOSIS — Z833 Family history of diabetes mellitus: Secondary | ICD-10-CM

## 2021-11-11 DIAGNOSIS — E669 Obesity, unspecified: Secondary | ICD-10-CM | POA: Diagnosis present

## 2021-11-11 DIAGNOSIS — Z8543 Personal history of malignant neoplasm of ovary: Secondary | ICD-10-CM

## 2021-11-11 DIAGNOSIS — Z8349 Family history of other endocrine, nutritional and metabolic diseases: Secondary | ICD-10-CM

## 2021-11-11 DIAGNOSIS — Z8249 Family history of ischemic heart disease and other diseases of the circulatory system: Secondary | ICD-10-CM

## 2021-11-11 DIAGNOSIS — M7989 Other specified soft tissue disorders: Secondary | ICD-10-CM | POA: Diagnosis not present

## 2021-11-11 DIAGNOSIS — Z20822 Contact with and (suspected) exposure to covid-19: Secondary | ICD-10-CM | POA: Diagnosis present

## 2021-11-11 DIAGNOSIS — R300 Dysuria: Secondary | ICD-10-CM | POA: Diagnosis not present

## 2021-11-11 DIAGNOSIS — K6389 Other specified diseases of intestine: Secondary | ICD-10-CM | POA: Diagnosis not present

## 2021-11-11 DIAGNOSIS — S0083XA Contusion of other part of head, initial encounter: Secondary | ICD-10-CM | POA: Diagnosis present

## 2021-11-11 DIAGNOSIS — M549 Dorsalgia, unspecified: Secondary | ICD-10-CM

## 2021-11-11 DIAGNOSIS — R109 Unspecified abdominal pain: Secondary | ICD-10-CM | POA: Diagnosis not present

## 2021-11-11 DIAGNOSIS — Z9012 Acquired absence of left breast and nipple: Secondary | ICD-10-CM

## 2021-11-11 DIAGNOSIS — K5939 Other megacolon: Secondary | ICD-10-CM | POA: Diagnosis not present

## 2021-11-11 DIAGNOSIS — Z90710 Acquired absence of both cervix and uterus: Secondary | ICD-10-CM

## 2021-11-11 LAB — URINALYSIS, ROUTINE W REFLEX MICROSCOPIC
Bilirubin Urine: NEGATIVE
Glucose, UA: NEGATIVE mg/dL
Hgb urine dipstick: NEGATIVE
Ketones, ur: 5 mg/dL — AB
Leukocytes,Ua: NEGATIVE
Nitrite: NEGATIVE
Protein, ur: 30 mg/dL — AB
Specific Gravity, Urine: >1.046 — ABNORMAL HIGH (ref 1.005–1.030)
pH: 5 (ref 5.0–8.0)

## 2021-11-11 LAB — COMPREHENSIVE METABOLIC PANEL
ALT: 44 U/L (ref 0–44)
AST: 37 U/L (ref 15–41)
Albumin: 4.6 g/dL (ref 3.5–5.0)
Alkaline Phosphatase: 98 U/L (ref 38–126)
Anion gap: 10 (ref 5–15)
BUN: 22 mg/dL — ABNORMAL HIGH (ref 6–20)
CO2: 20 mmol/L — ABNORMAL LOW (ref 22–32)
Calcium: 9.9 mg/dL (ref 8.9–10.3)
Chloride: 104 mmol/L (ref 98–111)
Creatinine, Ser: 0.82 mg/dL (ref 0.44–1.00)
GFR, Estimated: >60 mL/min (ref >60)
Glucose, Bld: 107 mg/dL — ABNORMAL HIGH (ref 70–99)
Potassium: 3.6 mmol/L (ref 3.5–5.1)
Sodium: 134 mmol/L — ABNORMAL LOW (ref 135–145)
Total Bilirubin: 1.2 mg/dL (ref 0.3–1.2)
Total Protein: 8 g/dL (ref 6.5–8.1)

## 2021-11-11 LAB — C DIFFICILE QUICK SCREEN W PCR REFLEX
C Diff antigen: NEGATIVE
C Diff interpretation: NOT DETECTED
C Diff toxin: NEGATIVE

## 2021-11-11 LAB — CBC
HCT: 49 % — ABNORMAL HIGH (ref 36.0–46.0)
Hemoglobin: 16.4 g/dL — ABNORMAL HIGH (ref 12.0–15.0)
MCH: 30.3 pg (ref 26.0–34.0)
MCHC: 33.5 g/dL (ref 30.0–36.0)
MCV: 90.4 fL (ref 80.0–100.0)
Platelets: 243 10*3/uL (ref 150–400)
RBC: 5.42 MIL/uL — ABNORMAL HIGH (ref 3.87–5.11)
RDW: 13.6 % (ref 11.5–15.5)
WBC: 9.4 10*3/uL (ref 4.0–10.5)
nRBC: 0 % (ref 0.0–0.2)

## 2021-11-11 LAB — POC URINE PREG, ED: Preg Test, Ur: NEGATIVE

## 2021-11-11 LAB — GLUCOSE, CAPILLARY: Glucose-Capillary: 100 mg/dL — ABNORMAL HIGH (ref 70–99)

## 2021-11-11 LAB — BASIC METABOLIC PANEL
Anion gap: 11 (ref 5–15)
BUN: 21 mg/dL — ABNORMAL HIGH (ref 6–20)
CO2: 19 mmol/L — ABNORMAL LOW (ref 22–32)
Calcium: 10 mg/dL (ref 8.9–10.3)
Chloride: 106 mmol/L (ref 98–111)
Creatinine, Ser: 0.93 mg/dL (ref 0.44–1.00)
GFR, Estimated: >60 mL/min (ref >60)
Glucose, Bld: 106 mg/dL — ABNORMAL HIGH (ref 70–99)
Potassium: 3.9 mmol/L (ref 3.5–5.1)
Sodium: 136 mmol/L (ref 135–145)

## 2021-11-11 LAB — RESP PANEL BY RT-PCR (FLU A&B, COVID) ARPGX2
Influenza A by PCR: NEGATIVE
Influenza B by PCR: NEGATIVE
SARS Coronavirus 2 by RT PCR: NEGATIVE

## 2021-11-11 LAB — LIPASE, BLOOD: Lipase: 40 U/L (ref 11–51)

## 2021-11-11 LAB — MAGNESIUM: Magnesium: 1.7 mg/dL (ref 1.7–2.4)

## 2021-11-11 LAB — CBG MONITORING, ED: Glucose-Capillary: 95 mg/dL (ref 70–99)

## 2021-11-11 MED ORDER — ONDANSETRON HCL 4 MG/2ML IJ SOLN
4.0000 mg | Freq: Four times a day (QID) | INTRAMUSCULAR | Status: DC | PRN
  Administered 2021-11-13 – 2021-11-17 (×6): 4 mg via INTRAVENOUS
  Filled 2021-11-11 (×5): qty 2

## 2021-11-11 MED ORDER — POLYETHYLENE GLYCOL 3350 17 G PO PACK: 17.0000 g | PACK | Freq: Every day | ORAL | Status: DC | PRN

## 2021-11-11 MED ORDER — SODIUM CHLORIDE 0.9 % IV SOLN: INTRAVENOUS | Status: AC

## 2021-11-11 MED ORDER — ASPIRIN 81 MG PO CHEW
81.0000 mg | CHEWABLE_TABLET | Freq: Every day | ORAL | Status: DC
  Administered 2021-11-12 – 2021-11-18 (×6): 81 mg via ORAL
  Filled 2021-11-11 (×7): qty 1

## 2021-11-11 MED ORDER — ONDANSETRON HCL 4 MG PO TABS: 4.0000 mg | ORAL_TABLET | Freq: Four times a day (QID) | ORAL | Status: DC | PRN

## 2021-11-11 MED ORDER — ACETAMINOPHEN 325 MG PO TABS
650.0000 mg | ORAL_TABLET | Freq: Four times a day (QID) | ORAL | Status: DC | PRN
  Administered 2021-11-13 – 2021-11-15 (×3): 650 mg via ORAL
  Filled 2021-11-11 (×3): qty 2

## 2021-11-11 MED ORDER — ONDANSETRON HCL 4 MG/2ML IJ SOLN
4.0000 mg | INTRAMUSCULAR | Status: AC
  Administered 2021-11-11: 4 mg via INTRAVENOUS
  Filled 2021-11-11: qty 2

## 2021-11-11 MED ORDER — ONDANSETRON HCL 4 MG/2ML IJ SOLN
4.0000 mg | Freq: Once | INTRAMUSCULAR | Status: AC
  Administered 2021-11-11: 4 mg via INTRAVENOUS
  Filled 2021-11-11: qty 2

## 2021-11-11 MED ORDER — INSULIN ASPART 100 UNIT/ML IJ SOLN: 0.0000 [IU] | Freq: Four times a day (QID) | INTRAMUSCULAR | Status: DC

## 2021-11-11 MED ORDER — SODIUM CHLORIDE 0.9 % IV BOLUS
1000.0000 mL | Freq: Once | INTRAVENOUS | Status: AC
  Administered 2021-11-11: 1000 mL via INTRAVENOUS

## 2021-11-11 MED ORDER — LEVOTHYROXINE SODIUM 25 MCG PO TABS
125.0000 ug | ORAL_TABLET | Freq: Every day | ORAL | Status: DC
Start: 2021-11-12 — End: 2021-11-18
  Administered 2021-11-12 – 2021-11-18 (×7): 125 ug via ORAL
  Filled 2021-11-11 (×7): qty 1

## 2021-11-11 MED ORDER — IOHEXOL 300 MG/ML  SOLN
100.0000 mL | Freq: Once | INTRAMUSCULAR | Status: AC | PRN
  Administered 2021-11-11: 100 mL via INTRAVENOUS

## 2021-11-11 MED ORDER — DULOXETINE HCL 60 MG PO CPEP
60.0000 mg | ORAL_CAPSULE | Freq: Every day | ORAL | Status: DC
  Administered 2021-11-12 – 2021-11-18 (×6): 60 mg via ORAL
  Filled 2021-11-11 (×7): qty 1

## 2021-11-11 MED ORDER — HYDROMORPHONE HCL 1 MG/ML IJ SOLN
0.5000 mg | INTRAMUSCULAR | Status: DC | PRN
  Administered 2021-11-11 – 2021-11-15 (×17): 0.5 mg via INTRAVENOUS
  Filled 2021-11-11 (×17): qty 0.5

## 2021-11-11 MED ORDER — HYDROMORPHONE HCL 1 MG/ML IJ SOLN
1.0000 mg | Freq: Once | INTRAMUSCULAR | Status: AC
  Administered 2021-11-11: 1 mg via INTRAVENOUS
  Filled 2021-11-11: qty 1

## 2021-11-11 MED ORDER — ACETAMINOPHEN 650 MG RE SUPP: 650.0000 mg | Freq: Four times a day (QID) | RECTAL | Status: DC | PRN

## 2021-11-11 MED ORDER — SENNOSIDES-DOCUSATE SODIUM 8.6-50 MG PO TABS
2.0000 | ORAL_TABLET | Freq: Two times a day (BID) | ORAL | Status: DC
  Administered 2021-11-11 – 2021-11-18 (×12): 2 via ORAL
  Filled 2021-11-11 (×14): qty 2

## 2021-11-11 MED ORDER — ALPRAZOLAM 1 MG PO TABS
1.0000 mg | ORAL_TABLET | Freq: Two times a day (BID) | ORAL | Status: DC
  Administered 2021-11-11 – 2021-11-18 (×12): 1 mg via ORAL
  Filled 2021-11-11 (×14): qty 1

## 2021-11-11 MED ORDER — CYCLOSPORINE 0.05 % OP EMUL: 1.0000 [drp] | Freq: Two times a day (BID) | OPHTHALMIC | Status: DC | PRN

## 2021-11-11 MED ORDER — SODIUM CHLORIDE 0.9 % IV SOLN: INTRAVENOUS | Status: DC

## 2021-11-11 MED ORDER — RIVAROXABAN 20 MG PO TABS
20.0000 mg | ORAL_TABLET | Freq: Every day | ORAL | Status: DC
  Administered 2021-11-12 – 2021-11-17 (×5): 20 mg via ORAL
  Filled 2021-11-11 (×6): qty 1

## 2021-11-11 NOTE — ED Notes (Addendum)
Pts blood sugar is 95. Nurse notified

## 2021-11-11 NOTE — Patient Instructions (Signed)
Please proceed to the ER for further evaluation of your GI symptoms, IV rehydration and evaluation after syncopal episode

## 2021-11-11 NOTE — ED Triage Notes (Signed)
Nausea and vomiting, history of small bowel blockage

## 2021-11-11 NOTE — Progress Notes (Signed)
Referring Provider: Fayrene Helper, MD Primary Care Physician:  Fayrene Helper, MD Primary GI Physician: Jenetta Downer   Chief Complaint  Patient presents with   Emesis    Patient here today with complaints of having multiple trips to have a bm, when she does she is evacuating bile, She is also vomiting up bile. She reports having a sore throat from the bile. Also had an episode of fainting this am, patient states she does not remember this happening. She states she has some occasional abdominal pain when she feels the need to have a bm. She states she has a history of a hernia. She reports feeling weak.   HPI:   [name] is a 60 y.o. female with past medical history of Paget's disease breast cancer s/p mastectomy, PE in 1997 on Xarelto and IVC filter placement s/p partial removal, anxiety, PSVT, status post ablation, prior stroke, fibromyalgia, ovarian cyst status post BSO, complicated hx of partial colectomy due to redundant colon with primary anastomosis, perforated bowel due to gynecolic procedure complicated by enterocutaneous fistula that required multiple surgeries, multiple SBOs.   Patient presenting today for hospital f/u, bilious vomiting and diarrhea.   Patient had hospitalization 10/25/21 for partial SBO, she reports that she was initially treated with abx for suspected infection, however, infection was later ruled out. CT A/P during admission showed multiple mildly dilated loops of small bowel in RLQ, gas and stool visualized in remaining large bowel,  low-grade, partial SBO not excluded. She was treated with bowel rest and reports that her symptoms improved after a few days.  She reports since Sunday she has had diarrhea and vomiting that is clear to yellow (bile looking), she reports an episode of syncope this morning that she does not fully remember, but she did hit her head and her knee. She reports that she feels a hot sensation in her abdomen that radiates  into her chest and throat. She has not been able to keep much down since Sunday except for a few sips of cola, no food intake. She reports occasional abdominal discomfort when she needs to go vomit but no extreme abdominal pain. She reports that she had very frequent episodes of diarrhea Sunday that was just clear liquid and bile. She has not had a BM since 7am this morning, she is not passing flatus, she continues to feel nauseated.  Notably her CT A/P during admission also showed mild intra and extra hepatic ductal dilation, MRCP was recommended. LFTs: AST 35, ALT 29, Alk PHos 97. Hx of cholecystectomy.  Last Colonoscopy:05/02/21- Preparation of the colon was fair, improved with lavage. - The examined portion of the ileum was normal. - Patent side-to-side ileo-colonic anastomosis, characterized by healthy appearing mucosa. Torsioned anastomosis. - Stool in the entire examined colon. Normal mucosa biopsied. - Non-bleeding internal hemorrhoids.  Last Endoscopy:05/02/21- No endoscopic esophageal abnormality to explain patient's dysphagia. Esophagus dilated. Dilated. Biopsied. - A single gastric polyp. Resected and retrieved. - Normal examined duodenum. Biopsied.  Past Medical History:  Diagnosis Date   Abdominal hernia 08/14/2019   Abdominal pain 06/06/2019   Acute bronchitis 05/01/2013   Acute cystitis 06/09/2010   Qualifier: Diagnosis of  By: Cori Razor LPN, Brandi     Acute renal insufficiency 08/28/2011   Allergy    Phreesia 03/16/2021   Anemia    Phreesia 10/29/2020   Anxiety    Anxiety state 03/16/2008   Qualifier: Diagnosis of  By: Dierdre Harness     Arthritis  Phreesia 10/29/2020   Back pain with radiation 07/17/2014   Bilateral hand pain 07/27/2016   Blood transfusion without reported diagnosis    Phreesia 10/29/2020   Breast cancer ([place])    L breast- 2006   Cancer ([place])    Phreesia 10/29/2020   Carpal tunnel syndrome    Chronic constipation    Chronic pain syndrome    followed by  [facility]---  back   Clotting disorder ([place])    Phreesia 10/29/2020   Depression    Dermatitis 12/15/2011   Difficult intubation    Educated about COVID-19 virus infection 05/14/2019   Fall at home 01/26/2016   Family history of adverse reaction to anesthesia    MOTHER--- PONV   Fatigue 09/17/2012   Fibromyalgia    FIBROMYALGIA 03/16/2008   Qualifier: Diagnosis of  By: Dierdre Harness     GERD (gastroesophageal reflux disease)    Headache disorder 01/16/2013   Headache syndrome 01/26/2016   Hip pain, right 07/08/2019   History of MRSA infection    lip abscess   History of ovarian cyst 06/2011   s/p  BSO   History of pulmonary embolus (PE) 1997   post EP with ablation pulmonary veouns for SVT/ Atrial Fib.   History of supraventricular tachycardia    s/p  ablation 1996  and 1997  by dr Caryl Comes   History of TIA (transient ischemic attack) 1997   post op EP ablation PE   Hyperlipidemia    Hyperlipidemia LDL goal <100 03/16/2008   Qualifier: Diagnosis of  By: Dierdre Harness     Hypothyroidism    followed by pcp   Incisional hernia    Insomnia 12/15/2011   Intermittent palpitations 08/22/2017   Interstitial cystitis    09-13-2018   per pt last flare-up  May 2019 (followed by pcp)   INTERSTITIAL CYSTITIS 02/17/2011   Qualifier: Diagnosis of  By: Moshe Cipro MD, Margaret     Iron deficiency anemia    09-13-2018  PER PT STABLE   Irregular heart rate 07/25/2013   Lipoma of back    upper   Low back pain radiating to right leg 07/04/2019   Low ferritin level 06/14/2014   MDD (major depressive disorder), single episode, in full remission ([place]) 10/27/2017   Medically noncompliant 02/28/2015   Multiple missed appointments, both follow-up appointments and lab appointments.    Metabolic syndrome X 9/41/7408   Qualifier: Diagnosis of  By: Moshe Cipro MD, Margaret  hBA1c is 5.8 in 02/2013    Migraines    Morbid obesity ([place]) 03/27/2013   Muscle spasm 01/03/2020   Nausea alone 07/17/2014   NECK PAIN,  CHRONIC 03/16/2008   Qualifier: Diagnosis of  By: Dierdre Harness     Normal coronary arteries    a. by CT 12/2018.   Obesity 03/16/2008   Qualifier: Diagnosis of  By: Dierdre Harness     Oral ulceration 08/23/2014   Presented at 06/14/2014 visit    Other malaise and fatigue 03/16/2008   Centricity Description: FATIGUE, CHRONIC Qualifier: Diagnosis of  By: Dierdre Harness   Centricity Description: FATIGUE Qualifier: Diagnosis of  By: Claybon Jabs PA, Dawn     OVARIAN CYST 12/26/2009   Qualifier: Diagnosis of  By: Moshe Cipro MD, Margaret     Paget disease of breast, left [facility])    Paget's disease of breast, left ([place]) 03/16/2008   Qualifier: Diagnosis of  By: Dierdre Harness  Left diagnosed in 2006 F/h breast cancer x 15 family members   Partial  small bowel obstruction ([place]) 07/04/2012   PONV (postoperative nausea and vomiting)    SEVERE   Postsurgical menopause 11/13/2011   Presence of IVC filter 06/06/2019   PSVT (paroxysmal supraventricular tachycardia) ([place])    [place]   Recurrent oral herpes simplex infection 10/13/2017   ROM (right otitis media) 01/23/2013   S/P insertion of IVC (inferior vena caval) filter 05/08/2005   [place] (non-retrievable)  /  dx 2019  a leg of filter is protruding thru the vena cava in to right L2 vertebral body (09-13-2018  per pt having surgery to remove filter in [place])   S/P radiofrequency ablation operation for arrhythmia 1996   1996  and 1997,   SVT and Atrial Fib   Sinusitis, chronic 01/26/2016   SMALL BOWEL OBSTRUCTION, HX OF 08/07/2008   Annotation: obstruction w/ adhesions led to partial colectomy Qualifier: Diagnosis of  By: Craige Cotta     Stroke [facility])    [place] 10/29/2020, TIAs in the past   Swelling of hand 09/17/2012   Syncope    Tachycardia 02/03/2010   Qualifier: Diagnosis of  By: Via LPN, Lynn     Thyroid disease    Phreesia 10/29/2020   Ulcer 12/15/2011   Urinary frequency 07/18/2014   Vaginitis and vulvovaginitis 05/10/2013    Vitamin D deficiency 05/05/2015   Wears glasses     Past Surgical History:  Procedure Laterality Date   ABDOMINAL HYSTERECTOMY  1987   ANTERIOR CERVICAL DECOMP/DISCECTOMY FUSION  03-07-2002   dr elsner  _0    C 4 -- 5   APPENDECTOMY  1980   AUGMENTATION MAMMAPLASTY Right 2006   BILATERAL SALPINGOOPHORECTOMY  07/24/2011   via Explor. Lap. w/ intraoperative perf. bowel repair   BIOPSY  05/02/2021   Procedure: BIOPSY;  Surgeon: Montez Morita, Quillian Quince, MD;  [facility];  Service: Gastroenterology;;  small bowel, mid esophagus, distal esophagus, random colon biopsies   BREAST BIOPSY Right 2019   benign   BREAST ENHANCEMENT SURGERY Bilateral 1993   BREAST IMPLANT REMOVAL Bilateral    BREAST SURGERY N/A    Phreesia 10/29/2020   CARDIAC CATHETERIZATION  07-06-2003   dr Darnell Level brodie   normal coronaries and LVF   CARDIAC ELECTROPHYSIOLOGY STUDY AND ABLATION  1996  and 1997   CARDIOVASCULAR STRESS TEST  11-18-2015   dr Caryl Comes   normal nuclear study w/ no ischemia/  normal LV function and wall motion , ef 84%   CARPAL TUNNEL RELEASE Right ?   CESAREAN SECTION  1986   CESAREAN SECTION N/A    Phreesia 10/29/2020   CHOLECYSTECTOMY N/A    Phreesia 10/29/2020   COLON SURGERY     COLONOSCOPY WITH PROPOFOL N/A 05/02/2021   Procedure: COLONOSCOPY WITH PROPOFOL;  Surgeon: Harvel Quale, MD;  [facility];  Service: Gastroenterology;  Laterality: N/A;  12:30 PM   CYSTO/  HYDRODISTENTION/  INSTILATION THERAPY  MULTIPLE   ENTEROCUTANEOUS FISTULA CLOSURE  multiple   last one 2015 with small bowel resection   ESOPHAGOGASTRODUODENOSCOPY (EGD) WITH PROPOFOL N/A 05/02/2021   Procedure: ESOPHAGOGASTRODUODENOSCOPY (EGD) WITH PROPOFOL;  Surgeon: Harvel Quale, MD;  [facility];  Service: Gastroenterology;  Laterality: N/A;   EXPLORATORY LAPAROTOMY INCISIONAL VENTRAL HERNIA REPAIR / RESECTION SMALL BOWEL  11-09-2014   _1    FRACTURE SURGERY N/A     Phreesia 10/29/2020   JOINT REPLACEMENT N/A    Phreesia 10/29/2020   LIPOMA EXCISION Right 09/16/2018   Procedure: EXCISION LIPOMA UPPER  BACK;  Surgeon: Clovis Riley, MD;  Location[facility];  Service: General;  Laterality: Right;   MASTECTOMY Left 2006   w/ reconstruction on left (paget's disease)  and right breast augmentation   POLYPECTOMY  05/02/2021   Procedure: POLYPECTOMY;  Surgeon: Harvel Quale, MD;  [facility];  Service: Gastroenterology;;  gastric   SAVORY DILATION  05/02/2021   Procedure: SAVORY DILATION;  Surgeon: Harvel Quale, MD;  [facility];  Service: Gastroenterology;;   SMALL INTESTINE SURGERY N/A    Phreesia 10/29/2020   SPINE SURGERY N/A    Phreesia 10/29/2020   TOTAL COLECTOMY  08-04-2002    _0    AND CHOLECYSTECTOMY  (colonic inertia)   TRANSTHORACIC ECHOCARDIOGRAM  02/04/2016   ef 60-65%,  grade 2 diastolic dysfunction/  mild MR   TUBAL LIGATION N/A    Phreesia 03/16/2021   VENA CAVA FILTER PLACEMENT  05/08/2005   _1    [place] (non-retrievable)   WIDE EXCISION PERIRECTAL ABSCESSES  09-22-2005   @ [facility]    Current Outpatient Medications  Medication Sig Dispense Refill   ALPRAZolam (XANAX) 1 MG tablet Take 1 tablet (1 mg total) by mouth 2 (two) times daily. 60 tablet 0   aspirin 81 MG chewable tablet Chew 81 mg by mouth daily.     butalbital-acetaminophen-caffeine (FIORICET) 50-325-40 MG tablet Take 1 tablet by mouth as directed.     Cholecalciferol 1.25 MG (50000 UT) capsule Take 50,000 Units by mouth once a week.     conjugated estrogens (PREMARIN) vaginal cream Place fingertip amount intravaginally 2 to 3 times per week (Patient taking differently: Place 0.1 mg vaginally at bedtime. Place fingertip amount intravaginally 2 to 3 times per week) 42.5 g 1   cyanocobalamin (,VITAMIN B-12,) 1000 MCG/ML injection Inject 1 mL (1,000 mcg total) into the muscle every 30 (thirty) days. 1 mL 11    cyclobenzaprine (FLEXERIL) 10 MG tablet TAKE 1 TABLET BY MOUTH TWICE DAILY AS NEEDED FOR MUSCLE SPASMS 180 tablet 0   cycloSPORINE (RESTASIS) 0.05 % ophthalmic emulsion Place 1 drop into both eyes 2 (two) times daily as needed (dry eyes).     Dextran 70-Hypromellose (ARTIFICIAL TEARS) 0.1-0.3 % SOLN Place 1 drop into both eyes daily as needed (dry eyes).     DULoxetine (CYMBALTA) 60 MG capsule Take 60 mg by mouth daily.     ezetimibe (ZETIA) 10 MG tablet Take 1 tablet (10 mg total) by mouth daily. 90 tablet 3   fluticasone (FLONASE) 50 MCG/ACT nasal spray Place 2 sprays into both nostrils daily. (Patient taking differently: Place 2 sprays into both nostrils daily as needed for allergies.) 16 g 6   HYDROmorphone (DILAUDID) 4 MG tablet Take 4 mg by mouth in the morning, at noon, and at bedtime.     levothyroxine (SYNTHROID) 125 MCG tablet TAKE 1 TABLET(125 MCG) BY MOUTH DAILY BEFORE BREAKFAST 90 tablet 1   magnesium oxide (MAG-OX) 400 (240 Mg) MG tablet Take 1 tablet (400 mg total) by mouth 2 (two) times daily. Please make overdue appt with Dr. Caryl Comes before anymore refills. Thank you 1st attempt (Patient taking differently: Take 1 tablet by mouth daily. Please make overdue appt with Dr. Caryl Comes before anymore refills. Thank you 1st attempt) 60 tablet 0   meclizine (ANTIVERT) 25 MG tablet Take 1 tablet (25 mg total) by mouth 3 (three) times daily as needed for dizziness. 30 tablet 0   metoprolol tartrate (LOPRESSOR) 25 MG tablet Take 1 tablet (  25 mg total) by mouth 2 (two) times daily. (Patient taking differently: Take 25 mg by mouth 2 (two) times daily as needed (Afib).) 180 tablet 3   Multiple Vitamins-Minerals (ICAPS AREDS 2) CAPS Take 1 capsule by mouth at bedtime.     omeprazole (PRILOSEC) 40 MG capsule TAKE 1 CAPSULE BY MOUTH EVERY DAY 180 capsule 3   pravastatin (PRAVACHOL) 40 MG tablet TAKE 1 TABLET(40 MG) BY MOUTH DAILY (Patient taking differently: Take 40 mg by mouth daily. TAKE 1 TABLET(40 MG) BY  MOUTH DAILY) 90 tablet 3   Semaglutide (OZEMPIC, 2 MG/DOSE, [place]) Inject into the skin. Every Monday .(For weight lose)     senna-docusate (SENOKOT-S) 8.6-50 MG per tablet Take 2 tablets by mouth 2 (two) times daily. constipation     Specialty Vitamins Products (VITAMINS FOR HAIR PO) Take 1 capsule by mouth daily.     vitamin C (ASCORBIC ACID) 500 MG tablet Take 500 mg by mouth at bedtime.     XARELTO 20 MG TABS tablet TAKE 1 TABLET(20 MG) BY MOUTH DAILY 30 tablet 5   Calcium Carbonate-Vitamin D (CALCIUM-VITAMIN D) 600-125 MG-UNIT TABS Take 1 tablet by mouth at bedtime. [place] (Patient not taking: Reported on 11/11/2021)     No current facility-administered medications for this visit.    Allergies as of 11/11/2021 - Review Complete 11/11/2021  Allergen Reaction Noted   Nitrofurantoin Hives 03/16/2008   Crestor [rosuvastatin calcium] Other (See Comments) 08/08/2019   Bisacodyl Other (See Comments) 07/03/2013   Clarithromycin Hives and Other (See Comments) 09/22/2010   Clarithromycin Other (See Comments) 08/29/2011   Clindamycin hcl Hives 08/29/2011   Codeine Itching 03/16/2008   Iron sucrose Other (See Comments) 03/10/2021   Monosodium glutamate Other (See Comments) 06/26/2014   Scopolamine hbr Other (See Comments) 08/29/2011    Family History  Problem Relation Age of Onset   Diabetes Mother    Heart disease Mother    Hypertension Mother    Heart disease Father    Hyperlipidemia Father    Hypertension Father    Alcohol abuse Father    Colon cancer Maternal Aunt    Breast cancer Maternal Aunt    Cancer Maternal Uncle        mets   Bone cancer Maternal Grandfather        mets   Ovarian cancer Cousin 19   Breast cancer Cousin    Prostate cancer Maternal Uncle    Breast cancer Maternal Aunt    Brain cancer Maternal Aunt    Breast cancer Maternal Aunt     Social History   Socioeconomic History   Marital status: Married    Spouse name: Not on file   Number  of children: Not on file   Years of education: Not on file   Highest education level: Not on file  Occupational History   Not on file  Tobacco Use   Smoking status: Never   Smokeless tobacco: Never  Vaping Use   Vaping Use: Never used  Substance and Sexual Activity   Alcohol use: No    Comment: twice per year   Drug use: No   Sexual activity: Yes    Birth control/protection: Surgical  Other Topics Concern   Not on file  Social History Narrative   Not on file   Social Determinants of Health   Financial Resource Strain: Medium Risk   Difficulty of Paying Living Expenses: Somewhat hard  Food Insecurity: No Food Insecurity   Worried About  Running Out of Food in the Last Year: Never true   Ran Out of Food in the Last Year: Never true  Transportation Needs: No Transportation Needs   Lack of Transportation (Medical): No   Lack of Transportation (Non-Medical): No  Physical Activity: Insufficiently Active   Days of Exercise per Week: 7 days   Minutes of Exercise per Session: 20 min  Stress: No Stress Concern Present   Feeling of Stress : Not at all  Social Connections: Moderately Isolated   Frequency of Communication with Friends and Family: More than three times a week   Frequency of Social Gatherings with Friends and Family: More than three times a week   Attends Religious Services: Never   Marine scientist or Organizations: No   Attends Music therapist: Never   Marital Status: Married   Review of systems General: negative for malaise, night sweats, fever, chills, weight loss Neck: Negative for lumps, goiter, pain and significant neck swelling Resp: Negative for cough, wheezing, dyspnea at rest CV: Negative for chest pain, leg swelling, palpitations, orthopnea GI: denies melena, hematochezia, constipation, dysphagia, odyonophagia, early satiety or unintentional weight loss. +nausea +vomiting +diarrhea MSK: Negative for joint pain or swelling, back pain,  and muscle pain. Derm: Negative for itching or rash Psych: Denies depression, anxiety, memory loss, confusion. No homicidal or suicidal ideation.  Heme: Negative for prolonged bleeding, bruising easily, and swollen nodes. Endocrine: Negative for cold or heat intolerance, polyuria, polydipsia and goiter. Neuro: negative for tremor, and seizures. +syncope The remainder of the review of systems is noncontributory.  Physical Exam: BP 113/81 (BP Location: Right Arm, Patient Position: Sitting, Cuff Size: Large)   Pulse (!) 106   Temp 98 F (36.7 C) (Oral)   Ht _0  (1.626 m)   Wt 188 lb 3.2 oz (85.4 kg)   BMI 32.30 kg/m  General:   Alert and oriented. Pleasant and cooperative. Appears dehydrated, unable to stand without assitance Head:  Normocephalic and atraumatic. Eyes:  Conjuctiva clear without scleral icterus. Mouth:  Oral mucosa pink and moist. Good dentition. No lesions. Heart: Normal rate and rhythm, s1 and s2 heart sounds present.  Lungs: Clear lung sounds in all lobes. Respirations equal and unlabored. Abdomen:  +BS, soft, and non-distended. No rebound or guarding. No HSM or masses noted. +mildly tender diffusely  Derm: No palmar erythema or jaundice Msk:  Symmetrical without gross deformities. Normal posture. Extremities:  Without edema. Neurologic:  Alert and  oriented x4 Psych:  Alert and cooperative. Normal mood and affect.  Invalid input(s): 6 MONTHS   ASSESSMENT: [name] is a 60 y.o. female presenting today for hospital follow up of SBO with continued nausea and vomiting of bile.   Bowel sounds are present, she is without surgical abdomen and mildly tender diffusely, however, Given patient's recent hx of SBO, vomiting and diarrhea since Sunday as well as syncopal episode, I am recommending she proceed to ED for further evaluation with possible CT imagining of her abdomen to r/o recurrent SBO. She may also benefit from MRCP if she requires admission for  further evaluate dilation of extra and intra hepatic biliary ducts. She does not have a gallbladder and last LFTs were only slightly elevated, so this is concerning.  Of note, given she is currently anticoagulated, she will likely need head CT r/t syncopal episode this morning. She is alert and oriented at this time, however, she cannot recall the entire event proceeding up to syncope.  Patient and  her husband verbalized understanding regarding recommendations of proceeding to the ER. I do not feel that she needs EMS transport and patient and husband are comfortable proceeding via POV.   PLAN:  Proceed to ER for further evaluation of GI symptoms and syncopal episode  Follow Up: TBD  Hassani Sliney L. Alver Sorrow, MSN, APRN, AGNP-C Adult-Gerontology Nurse Practitioner [facility]

## 2021-11-11 NOTE — ED Provider Notes (Signed)
[facility] EMERGENCY DEPARTMENT Provider Note   CSN: [id number] Arrival date & time: 11/11/21  1220     History Chief Complaint  Patient presents with   Nausea    [name] is a 60 y.o. female.  HPI  This patient is a 60 year old female who unfortunately has a history of multiple abdominal surgeries in the past, reports that she had some bowel issues that were found after having a cholecystectomy and required a resection of a large amount of her large intestine.  Since that time she has had some intermittent bowel obstructions including a couple of weeks ago when she was admitted to [facility] where she spent 5 days and thankfully did not require surgery finally being discharged in improved condition.  Over the last 2 days she has now started to vomit a yellow and brownish colored bile and is started to have a similar appearing stool but has felt extremely distended and increasing pain in her abdomen.  She was sleeping in a recliner last night because she did not want to lay down and this morning had a syncopal episode when she stood up falling face first and striking the right side of her face into the ground.  Symptoms are persistent, gradually worsening, not associated with any fevers chills coughing or shortness of breath.  She states this feels similar to prior bowel obstructions  Past Medical History:  Diagnosis Date   Abdominal hernia 08/14/2019   Abdominal pain 06/06/2019   Acute bronchitis 05/01/2013   Acute cystitis 06/09/2010   Qualifier: Diagnosis of  By: Cori Razor LPN, Brandi     Acute renal insufficiency 08/28/2011   Allergy    Phreesia 03/16/2021   Anemia    Phreesia 10/29/2020   Anxiety    Anxiety state 03/16/2008   Qualifier: Diagnosis of  By: Dierdre Harness     Arthritis    Phreesia 10/29/2020   Back pain with radiation 07/17/2014   Bilateral hand pain 07/27/2016   Blood transfusion without reported diagnosis     Phreesia 10/29/2020   Breast cancer ([place])    L breast- 2006   Cancer ([place])    Phreesia 10/29/2020   Carpal tunnel syndrome    Chronic constipation    Chronic pain syndrome    followed by [facility]---  back   Clotting disorder ([place])    Phreesia 10/29/2020   Depression    Dermatitis 12/15/2011   Difficult intubation    Educated about COVID-19 virus infection 05/14/2019   Fall at home 01/26/2016   Family history of adverse reaction to anesthesia    MOTHER--- PONV   Fatigue 09/17/2012   Fibromyalgia    FIBROMYALGIA 03/16/2008   Qualifier: Diagnosis of  By: Dierdre Harness     GERD (gastroesophageal reflux disease)    Headache disorder 01/16/2013   Headache syndrome 01/26/2016   Hip pain, right 07/08/2019   History of MRSA infection    lip abscess   History of ovarian cyst 06/2011   s/p  BSO   History of pulmonary embolus (PE) 1997   post EP with ablation pulmonary veouns for SVT/ Atrial Fib.   History of supraventricular tachycardia    s/p  ablation 1996  and 1997  by dr Caryl Comes   History of TIA (transient ischemic attack) 1997   post op EP ablation PE   Hyperlipidemia    Hyperlipidemia LDL goal <100 03/16/2008   Qualifier: Diagnosis of  By: Bullins,  Luann     Hypothyroidism    followed by pcp   Incisional hernia    Insomnia 12/15/2011   Intermittent palpitations 08/22/2017   Interstitial cystitis    09-13-2018   per pt last flare-up  May 2019 (followed by pcp)   INTERSTITIAL CYSTITIS 02/17/2011   Qualifier: Diagnosis of  By: Moshe Cipro MD, Margaret     Iron deficiency anemia    09-13-2018  PER PT STABLE   Irregular heart rate 07/25/2013   Lipoma of back    upper   Low back pain radiating to right leg 07/04/2019   Low ferritin level 06/14/2014   MDD (major depressive disorder), single episode, in full remission ([place]) 10/27/2017   Medically noncompliant 02/28/2015   Multiple missed appointments, both follow-up appointments and lab appointments.    Metabolic syndrome X 07/12/9677    Qualifier: Diagnosis of  By: Moshe Cipro MD, Margaret  hBA1c is 5.8 in 02/2013    Migraines    Morbid obesity ([place]) 03/27/2013   Muscle spasm 01/03/2020   Nausea alone 07/17/2014   NECK PAIN, CHRONIC 03/16/2008   Qualifier: Diagnosis of  By: Dierdre Harness     Normal coronary arteries    a. by CT 12/2018.   Obesity 03/16/2008   Qualifier: Diagnosis of  By: Dierdre Harness     Oral ulceration 08/23/2014   Presented at 06/14/2014 visit    Other malaise and fatigue 03/16/2008   Centricity Description: FATIGUE, CHRONIC Qualifier: Diagnosis of  By: Dierdre Harness   Centricity Description: FATIGUE Qualifier: Diagnosis of  By: Claybon Jabs PA, Dawn     OVARIAN CYST 12/26/2009   Qualifier: Diagnosis of  By: Moshe Cipro MD, Margaret     Paget disease of breast, left [facility])    Paget's disease of breast, left ([place]) 03/16/2008   Qualifier: Diagnosis of  By: Dierdre Harness  Left diagnosed in 2006 F/h breast cancer x 15 family members   Partial small bowel obstruction ([place]) 07/04/2012   PONV (postoperative nausea and vomiting)    SEVERE   Postsurgical menopause 11/13/2011   Presence of IVC filter 06/06/2019   PSVT (paroxysmal supraventricular tachycardia) ([place])    [place]   Recurrent oral herpes simplex infection 10/13/2017   ROM (right otitis media) 01/23/2013   S/P insertion of IVC (inferior vena caval) filter 05/08/2005   [place] (non-retrievable)  /  dx 2019  a leg of filter is protruding thru the vena cava in to right L2 vertebral body (09-13-2018  per pt having surgery to remove filter in [place])   S/P radiofrequency ablation operation for arrhythmia 1996   1996  and 1997,   SVT and Atrial Fib   Sinusitis, chronic 01/26/2016   SMALL BOWEL OBSTRUCTION, HX OF 08/07/2008   Annotation: obstruction w/ adhesions led to partial colectomy Qualifier: Diagnosis of  By: Craige Cotta     Stroke [facility])    [place] 10/29/2020, TIAs in the past   Swelling of hand 09/17/2012   Syncope    Tachycardia 02/03/2010    Qualifier: Diagnosis of  By: Via LPN, Lynn     Thyroid disease    Phreesia 10/29/2020   Ulcer 12/15/2011   Urinary frequency 07/18/2014   Vaginitis and vulvovaginitis 05/10/2013   Vitamin D deficiency 05/05/2015   Wears glasses     Patient Active Problem List   Diagnosis Date Noted   Bilious vomiting with nausea 11/11/2021   Diarrhea 11/11/2021   Dilation of biliary tract 11/11/2021   Hyperglycemia 11/11/2021  Partial small bowel obstruction (HCC) 11/11/2021   Post-resection malabsorption 09/18/2021   Hematuria 07/18/2021   Fever 07/18/2021   Bloating 04/17/2021   Chronic diarrhea 04/17/2021   ADHD, predominantly hyperactive type 01/20/2021   Muscle spasm 01/20/2021   Ovarian cancer ([place]) 12/03/2020   Long-term current use of opiate analgesic 10/22/2020   Migraine 10/22/2020   Muscle spasms of both lower extremities 01/03/2020   Hip pain, right 07/08/2019   Low back pain radiating to right leg 07/04/2019   B12 deficiency 10/11/2018   Other specified abnormal findings of blood chemistry 10/11/2018   Malabsorption 09/23/2018   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, left ([place]) 09/23/2018   Embedded foreign body 03/01/2018   Intermittent palpitations 08/22/2017   Essential hypertension 08/22/2017   Snoring 12/21/2016   Bilateral hand pain 07/27/2016   History of DVT (deep vein thrombosis) 03/30/2016   History of pulmonary embolus (PE) 03/30/2016   S/P insertion of IVC (inferior vena caval) filter 03/05/2016   Headache syndrome 01/26/2016   Spinal stenosis of lumbar region with radiculopathy 08/28/2015   Vitamin D deficiency 05/05/2015   Uncontrolled pain 04/15/2015   Right flank pain 09/07/2014   Chronic right-sided low back pain with bilateral sciatica 07/17/2014   Chest pain 06/14/2014   Low ferritin level 06/14/2014   Adjustment disorder with anxiety 08/31/2013   Irregular heart rate 07/25/2013   GERD (gastroesophageal reflux disease) 05/10/2013    Seasonal allergies 05/10/2013   Morbid obesity ([place]) 03/27/2013   Encounter for monitoring opioid maintenance therapy 01/16/2013   Chronic pain syndrome 01/16/2013   Fatigue 09/17/2012   Ventral hernia 08/03/2012   Stress incontinence 02/18/2012   Insomnia 12/15/2011   Postsurgical menopause 11/13/2011   Interstitial cystitis 34/74/2595   Metabolic syndrome X 63/87/5643   CARPAL TUNNEL SYNDROME, RIGHT 06/09/2010   OVARIAN CYST 12/26/2009   Iron deficiency anemia 08/07/2008   Hx SBO 08/07/2008   Paget's disease of breast, left ([place]) 03/16/2008   Hypothyroidism 03/16/2008   Hyperlipidemia LDL goal <100 03/16/2008   Anxiety state 03/16/2008   Whole body pain 03/16/2008   FIBROMYALGIA 03/16/2008    Past Surgical History:  Procedure Laterality Date   ABDOMINAL HYSTERECTOMY  1987   ANTERIOR CERVICAL DECOMP/DISCECTOMY FUSION  03-07-2002   dr elsner  @[facility]    C 4 -- 5   APPENDECTOMY  1980   AUGMENTATION MAMMAPLASTY Right 2006   BILATERAL SALPINGOOPHORECTOMY  07/24/2011   via Explor. Lap. w/ intraoperative perf. bowel repair   BIOPSY  05/02/2021   Procedure: BIOPSY;  Surgeon: Montez Morita, Quillian Quince, MD;  [facility];  Service: Gastroenterology;;  small bowel, mid esophagus, distal esophagus, random colon biopsies   BREAST BIOPSY Right 2019   benign   BREAST ENHANCEMENT SURGERY Bilateral 1993   BREAST IMPLANT REMOVAL Bilateral    BREAST SURGERY N/A    Phreesia 10/29/2020   CARDIAC CATHETERIZATION  07-06-2003   dr Darnell Level brodie   normal coronaries and LVF   CARDIAC ELECTROPHYSIOLOGY STUDY AND ABLATION  1996  and 1997   CARDIOVASCULAR STRESS TEST  11-18-2015   dr Caryl Comes   normal nuclear study w/ no ischemia/  normal LV function and wall motion , ef 84%   CARPAL TUNNEL RELEASE Right ?   CESAREAN SECTION  1986   CESAREAN SECTION N/A    Phreesia 10/29/2020   CHOLECYSTECTOMY N/A    Phreesia 10/29/2020   COLON SURGERY     COLONOSCOPY WITH PROPOFOL N/A 05/02/2021    Procedure: COLONOSCOPY  WITH PROPOFOL;  Surgeon: Montez Morita, Quillian Quince, MD;  Location: [facility];  Service: Gastroenterology;  Laterality: N/A;  12:30 PM   CYSTO/  HYDRODISTENTION/  INSTILATION THERAPY  MULTIPLE   ENTEROCUTANEOUS FISTULA CLOSURE  multiple   last one 2015 with small bowel resection   ESOPHAGOGASTRODUODENOSCOPY (EGD) WITH PROPOFOL N/A 05/02/2021   Procedure: ESOPHAGOGASTRODUODENOSCOPY (EGD) WITH PROPOFOL;  Surgeon: Harvel Quale, MD;  Location[facility];  Service: Gastroenterology;  Laterality: N/A;   EXPLORATORY LAPAROTOMY INCISIONAL VENTRAL HERNIA REPAIR / RESECTION SMALL BOWEL  11-09-2014   @[facility]    FRACTURE SURGERY N/A    Phreesia 10/29/2020   JOINT REPLACEMENT N/A    Phreesia 10/29/2020   LIPOMA EXCISION Right 09/16/2018   Procedure: EXCISION LIPOMA UPPER BACK;  Surgeon: Clovis Riley, MD;  Location: [place];  Service: General;  Laterality: Right;   MASTECTOMY Left 2006   w/ reconstruction on left (paget's disease)  and right breast augmentation   POLYPECTOMY  05/02/2021   Procedure: POLYPECTOMY;  Surgeon: Harvel Quale, MD;  [facility];  Service: Gastroenterology;;  gastric   SAVORY DILATION  05/02/2021   Procedure: Azzie Almas DILATION;  Surgeon: Harvel Quale, MD;  [facility];  Service: Gastroenterology;;   SMALL INTESTINE SURGERY N/A    Phreesia 10/29/2020   SPINE SURGERY N/A    Phreesia 10/29/2020   TOTAL COLECTOMY  08-04-2002    @[facility]    AND CHOLECYSTECTOMY  (colonic inertia)   TRANSTHORACIC ECHOCARDIOGRAM  02/04/2016   ef 60-65%,  grade 2 diastolic dysfunction/  mild MR   TUBAL LIGATION N/A    Phreesia 03/16/2021   VENA CAVA FILTER PLACEMENT  05/08/2005   @[facility]    [place] (non-retrievable)   WIDE EXCISION PERIRECTAL ABSCESSES  09-22-2005   @ [facility]     OB History   No obstetric history on file.     Family History  Problem Relation Age of Onset   Diabetes Mother     Heart disease Mother    Hypertension Mother    Heart disease Father    Hyperlipidemia Father    Hypertension Father    Alcohol abuse Father    Colon cancer Maternal Aunt    Breast cancer Maternal Aunt    Cancer Maternal Uncle        mets   Bone cancer Maternal Grandfather        mets   Ovarian cancer Cousin 19   Breast cancer Cousin    Prostate cancer Maternal Uncle    Breast cancer Maternal Aunt    Brain cancer Maternal Aunt    Breast cancer Maternal Aunt     Social History   Tobacco Use   Smoking status: Never   Smokeless tobacco: Never  Vaping Use   Vaping Use: Never used  Substance Use Topics   Alcohol use: No    Comment: twice per year   Drug use: No    Home Medications Prior to Admission medications   Medication Sig Start Date End Date Taking? Authorizing Provider  ALPRAZolam [name]) 1 MG tablet Take 1 tablet (1 mg total) by mouth 2 (two) times daily. 08/26/21   Fayrene Helper, MD  aspirin 81 MG chewable tablet Chew 81 mg by mouth daily.    [provider]  butalbital-acetaminophen-caffeine (FIORICET) 50-325-40 MG tablet Take 1 tablet by mouth as directed. 06/19/19   [provider]  Calcium Carbonate-Vitamin D (CALCIUM-VITAMIN D) 600-125 MG-UNIT TABS Take 1 tablet by mouth at  bedtime. Petra Kuba made [place] Patient not taking: Reported on 11/11/2021    [provider]  Cholecalciferol 1.25 MG (50000 UT) capsule Take 50,000 Units by mouth once a week.    [provider]  conjugated estrogens (PREMARIN) vaginal cream Place fingertip amount intravaginally 2 to 3 times per week Patient taking differently: Place 0.1 mg vaginally at bedtime. Place fingertip amount intravaginally 2 to 3 times per week 07/12/20   Fayrene Helper, MD  cyanocobalamin (,VITAMIN B-12,) 1000 MCG/ML injection Inject 1 mL (1,000 mcg total) into the muscle every 30 (thirty) days. 09/18/21   Harriett Rush, PA-C  cyclobenzaprine (FLEXERIL) 10 MG  tablet TAKE 1 TABLET BY MOUTH TWICE DAILY AS NEEDED FOR MUSCLE SPASMS 02/21/20   Fayrene Helper, MD  cycloSPORINE (RESTASIS) 0.05 % ophthalmic emulsion Place 1 drop into both eyes 2 (two) times daily as needed (dry eyes).    [provider]  Dextran 70-Hypromellose (ARTIFICIAL TEARS) 0.1-0.3 % SOLN Place 1 drop into both eyes daily as needed (dry eyes).    [provider]  DULoxetine (CYMBALTA) 60 MG capsule Take 60 mg by mouth daily. 04/10/21   [provider]  ezetimibe (ZETIA) 10 MG tablet Take 1 tablet (10 mg total) by mouth daily. 09/28/21   Fayrene Helper, MD  fluticasone (FLONASE) 50 MCG/ACT nasal spray Place 2 sprays into both nostrils daily. Patient taking differently: Place 2 sprays into both nostrils daily as needed for allergies. 10/31/20   Fayrene Helper, MD  HYDROmorphone (DILAUDID) 4 MG tablet Take 4 mg by mouth in the morning, at noon, and at bedtime.    [provider]  levothyroxine (SYNTHROID) 125 MCG tablet TAKE 1 TABLET(125 MCG) BY MOUTH DAILY BEFORE BREAKFAST 08/26/21   Fayrene Helper, MD  magnesium oxide (MAG-OX) 400 (240 Mg) MG tablet Take 1 tablet (400 mg total) by mouth 2 (two) times daily. Please make overdue appt with Dr. Caryl Comes before anymore refills. Thank you 1st attempt Patient taking differently: Take 1 tablet by mouth daily. Please make overdue appt with Dr. Caryl Comes before anymore refills. Thank you 1st attempt 09/03/21   Deboraha Sprang, MD  meclizine (ANTIVERT) 25 MG tablet Take 1 tablet (25 mg total) by mouth 3 (three) times daily as needed for dizziness. 10/31/20   Fayrene Helper, MD  metoprolol tartrate (LOPRESSOR) 25 MG tablet Take 1 tablet (25 mg total) by mouth 2 (two) times daily. Patient taking differently: Take 25 mg by mouth 2 (two) times daily as needed (Afib). 01/15/21   Fayrene Helper, MD  Multiple Vitamins-Minerals (ICAPS AREDS 2) CAPS Take 1 capsule by mouth at bedtime.    [provider]   omeprazole (PRILOSEC) 40 MG capsule TAKE 1 CAPSULE BY MOUTH EVERY DAY 08/18/21   Claudette Laws, MD  pravastatin (PRAVACHOL) 40 MG tablet TAKE 1 TABLET(40 MG) BY MOUTH DAILY Patient taking differently: Take 40 mg by mouth daily. TAKE 1 TABLET(40 MG) BY MOUTH DAILY 01/15/21   Fayrene Helper, MD  Semaglutide (OZEMPIC, 2 MG/DOSE, [place]) Inject into the skin. Every Monday .(For weight lose)    [provider]  senna-docusate (SENOKOT-S) 8.6-50 MG per tablet Take 2 tablets by mouth 2 (two) times daily. constipation    [provider]  Specialty Vitamins Products (VITAMINS FOR HAIR PO) Take 1 capsule by mouth daily.    [provider]  vitamin C (ASCORBIC ACID) 500 MG tablet Take 500 mg by mouth at bedtime.  [provider]  XARELTO 20 MG TABS tablet TAKE 1 TABLET(20 MG) BY MOUTH DAILY 10/03/21   Lindell Spar, MD    Allergies    Nitrofurantoin, Crestor [rosuvastatin calcium], Bisacodyl, Clarithromycin, Clarithromycin, Clindamycin hcl, Codeine, Iron sucrose, Monosodium glutamate, and Scopolamine hbr  Review of Systems   Review of Systems  All other systems reviewed and are negative.  Physical Exam Updated Vital Signs BP 116/67 (BP Location: Right Arm)   Pulse 86   Temp 97.9 F (36.6 C) (Oral)   Resp 12   Ht 1.626 m (5\' 4" )   Wt 85.3 kg   SpO2 99%   BMI 32.27 kg/m   Physical Exam Vitals and nursing note reviewed.  Constitutional:      General: She is not in acute distress.    Appearance: She is well-developed.  HENT:     Head: Normocephalic.     Comments: Contusion to the right maxilla and zygomatic arch, normal extraocular movements, normal occlusion of the teeth    Mouth/Throat:     Pharynx: No oropharyngeal exudate.  Eyes:     General: No scleral icterus.       Right eye: No discharge.        Left eye: No discharge.     Conjunctiva/sclera: Conjunctivae normal.     Pupils: Pupils are equal, round, and reactive to light.  Neck:      Thyroid: No thyromegaly.     Vascular: No JVD.  Cardiovascular:     Rate and Rhythm: Regular rhythm. Tachycardia present.     Heart sounds: Normal heart sounds. No murmur heard.   No friction rub. No gallop.     Comments: Mild tachycardia, normal pulses at the radial arteries Pulmonary:     Effort: Pulmonary effort is normal. No respiratory distress.     Breath sounds: Normal breath sounds. No wheezing or rales.  Abdominal:     General: There is distension.     Palpations: Abdomen is soft. There is no mass.     Tenderness: There is abdominal tenderness.     Comments: High tinkling bowel sounds, tympanitic sounds to percussion of the abdomen  Musculoskeletal:        General: No tenderness. Normal range of motion.     Cervical back: Normal range of motion and neck supple.     Right lower leg: No edema.     Left lower leg: No edema.  Lymphadenopathy:     Cervical: No cervical adenopathy.  Skin:    General: Skin is warm and dry.     Findings: No erythema or rash.  Neurological:     General: No focal deficit present.     Mental Status: She is alert and oriented to person, place, and time. Mental status is at baseline.     Motor: No weakness.     Coordination: Coordination normal.  Psychiatric:        Behavior: Behavior normal.    ED Results / Procedures / Treatments   Labs (all labs ordered are listed, but only abnormal results are displayed) Labs Reviewed  BASIC METABOLIC PANEL - Abnormal; Notable for the following components:      Result Value   CO2 19 (*)    Glucose, Bld 106 (*)    BUN 21 (*)    All other components within normal limits  CBC - Abnormal; Notable for the following components:   RBC 5.42 (*)    Hemoglobin 16.4 (*)    HCT 49.0 (*)  All other components within normal limits  COMPREHENSIVE METABOLIC PANEL - Abnormal; Notable for the following components:   Sodium 134 (*)    CO2 20 (*)    Glucose, Bld 107 (*)    BUN 22 (*)    All other components within  normal limits  RESP PANEL BY RT-PCR (FLU A&B, COVID) ARPGX2  LIPASE, BLOOD  URINALYSIS, ROUTINE W REFLEX MICROSCOPIC  CBG MONITORING, ED  POC URINE PREG, ED    EKG EKG Interpretation  Date/Time:  Tuesday November 11 2021 12:52:18 EST Ventricular Rate:  105 PR Interval:  124 QRS Duration: 80 QT Interval:  326 QTC Calculation: 430 R Axis:   74 Text Interpretation: Sinus tachycardia Low voltage QRS Nonspecific T wave abnormality Abnormal ECG Confirmed by Noemi Chapel [id number]) on 11/11/2021 1:01:39 PM  Radiology CT ABDOMEN PELVIS W CONTRAST  Result Date: 11/11/2021 CLINICAL DATA:  Nausea, vomiting, dehydration. History of small-bowel obstruction and prior bowel resections. EXAM: CT ABDOMEN AND PELVIS WITH CONTRAST TECHNIQUE: Multidetector CT imaging of the abdomen and pelvis was performed using the standard protocol following bolus administration of intravenous contrast. CONTRAST:  180mL OMNIPAQUE IOHEXOL 300 MG/ML  SOLN COMPARISON:  06/12/2019 FINDINGS: Lower chest: Included lung bases are clear. Heart size is normal. Right breast prosthesis. Hepatobiliary: No focal liver abnormality is seen. Status post cholecystectomy. Similar degree of postoperative intra and extrahepatic biliary dilatation. Pancreas: Unremarkable. No pancreatic ductal dilatation or surrounding inflammatory changes. Spleen: Normal in size without focal abnormality. Adrenals/Urinary Tract: Unremarkable adrenal glands. Kidneys enhance symmetrically without focal lesion, stone, or hydronephrosis. Ureters are nondilated. Urinary bladder appears unremarkable for the degree of distension. Stomach/Bowel: Stomach within normal limits. Redemonstrated postoperative changes related to prior large and small bowel resections. No dilated loops of small bowel. Mildly dilated colon within the right abdomen with air-fluid levels. Mucosal hyperenhancement within the rectum without focal thickening. Vascular/Lymphatic: No significant vascular  findings are present. No enlarged abdominal or pelvic lymph nodes. Reproductive: Status post hysterectomy. No adnexal masses. Other: Large broad-based ventral midline abdominal wall hernia with postsurgical changes along the abdominal wall. No ascites. No pneumoperitoneum. Musculoskeletal: No acute or significant osseous findings. IMPRESSION: 1. Postsurgical changes to the bowel. Mildly dilated colon within the right abdomen with air-fluid levels suggestive of a diarrheal illness. Partial or developing bowel obstruction is not entirely excluded, particularly given the degree of postsurgical changes along the anterior abdominal wall. Close clinical follow-up recommended. 2. Mucosal hyperenhancement within the rectum without focal thickening, which may represent proctitis. Electronically Signed   By: Davina Poke D.O.   On: 11/11/2021 16:12   CT Maxillofacial Wo Contrast  Result Date: 11/11/2021 CLINICAL DATA:  Right-sided facial swelling after fall this morning. EXAM: CT MAXILLOFACIAL WITHOUT CONTRAST TECHNIQUE: Multidetector CT imaging of the maxillofacial structures was performed. Multiplanar CT image reconstructions were also generated. COMPARISON:  CT head dated April 13, 2016. CT maxillofacial dated August 11, 2009. FINDINGS: Osseous: No fracture or mandibular dislocation. No destructive process. Orbits: Negative. No traumatic or inflammatory finding. Sinuses: Clear. Soft tissues: Mild right facial soft tissue swelling. Limited intracranial: No significant or unexpected finding. IMPRESSION: 1. No acute maxillofacial fracture. 2. Mild right facial soft tissue swelling. Electronically Signed   By: Titus Dubin M.D.   On: 11/11/2021 16:08    Procedures Procedures   Medications Ordered in ED Medications  0.9 %  sodium chloride infusion ( Intravenous New Bag/Given 11/11/21 1649)  ondansetron (ZOFRAN) injection 4 mg (has no administration in time range)  sodium chloride 0.9 %  bolus 1,000 mL (0 mLs  Intravenous Stopped 11/11/21 1639)  ondansetron (ZOFRAN) injection 4 mg (4 mg Intravenous Given 11/11/21 1525)  HYDROmorphone (DILAUDID) injection 1 mg (1 mg Intravenous Given 11/11/21 1522)  iohexol (OMNIPAQUE) 300 MG/ML solution 100 mL (100 mLs Intravenous Contrast Given 11/11/21 1600)    ED Course  I have reviewed the triage vital signs and the nursing notes.  Pertinent labs & imaging results that were available during my care of the patient were reviewed by me and considered in my medical decision making (see chart for details).    MDM Rules/Calculators/A&P                           This patient is uncomfortable appearing, she has ongoing nausea, she will need IV fluids antiemetics and some pain medications.  We will perform CT scan to make sure there is no other findings, the patient is agreeable to the plan, I am concerned for bowel obstruction or other intra-abdominal process  CT negative - has IVF and zofran - improved. The CT scan thankfully does not show any signs of bowel obstruction while it does show that she has some dilated loops of bowel and a diarrheal illness.  Because of the patient's syncopal event, her dehydration and her ongoing significant symptoms I think she needs to be admitted to the hospital.  I discussed this with the patient, she tells me that she was under the understanding that her gastroenterologist wanted to see her while she was in the hospital admitted, this was communicated to the hospitalist Dr. Denton Brick, I appreciate her willingness to come and admit this patient.  Final Clinical Impression(s) / ED Diagnoses Final diagnoses:  Dehydration  Syncope, unspecified syncope type  Diarrhea, unspecified type     Noemi Chapel, MD 11/11/21 1651

## 2021-11-11 NOTE — H&P (Signed)
History and Physical    [name] ACZ:[id number] DOB: 21-Mar-1961 DOA: 11/11/2021  PCP: Fayrene Helper, MD   Patient coming from: Home  I have personally briefly reviewed patient's old medical records in [employer]  Chief Complaint: Vomiting, Diarrhea  HPI: [name] is a 60 y.o. female with medical history significant for small bowel obstruction, multiple abdominal surgeries , ADHD, HTN, DVT, PE.  Patient presented to the ED with complaints of multiple episodes of vomiting and loose stools started 2 days ago- 11/13.  She reports she has barely sat down after having a bowel movement before she has to get up again.  She reports yellowish colored stools.  No blood.  She reports generalized abdominal cramping.  She has a history of small bowel obstruction , but this feels different from her usual SBO.  This morning patient reports she was standing folding clothes when suddenly she turned to talk to her mother and passed out falling face down on the floor.  Reports she has been dizzy all day.  But she does not remember particular symptoms prior to passing out.  She reports hitting the right side of her face and her right knee on the floor.  Patient's was recently hospitalized 10/29 - 11/2 for partial small bowel obstruction at [facility].  CT showed multiple mildly dilated loops of small bowel within the right lower quadrant, but stool and gas was visualized in the large bowel and rectum.  Patient was treated conservatively.  Patient has an extensive medical history with multiple abdominal surgeries pertaining to a large rectus diastasis with abdominal hernia that began after an ovarian cyst removal that was converted to radical hysterectomy in 2012 at [facility]. This procedure was complicated by sepsis that required enterotomy and small bowel resection followed by chronic wound vac placement.  Patient reports she had a chronic open abdomen from  2012-2015.  ED Course: Stable vitals.  WBC 9.4.  Bicarb 19.  Maxillofacial CT without acute abnormality shows mild right facial soft tissue swelling.  Abdominal CT and pelvis-surgical changes to the bowel, mildly dilated colon within the right abdomen with a fluid level suggestive of diarrheal illness.  Partial or developing bowel obstruction not entirely excluded, particularly given the degree of postsurgical changes along the anterior abdominal wall. 1 L bolus given.  Hospitalist to admit.  Review of Systems: As per HPI all other systems reviewed and negative.  Past Medical History:  Diagnosis Date   Abdominal hernia 08/14/2019   Abdominal pain 06/06/2019   Acute bronchitis 05/01/2013   Acute cystitis 06/09/2010   Qualifier: Diagnosis of  By: Cori Razor LPN, Brandi     Acute renal insufficiency 08/28/2011   Allergy    Phreesia 03/16/2021   Anemia    Phreesia 10/29/2020   Anxiety    Anxiety state 03/16/2008   Qualifier: Diagnosis of  By: Dierdre Harness     Arthritis    Phreesia 10/29/2020   Back pain with radiation 07/17/2014   Bilateral hand pain 07/27/2016   Blood transfusion without reported diagnosis    Phreesia 10/29/2020   Breast cancer ([place])    L breast- 2006   Cancer ([place])    Phreesia 10/29/2020   Carpal tunnel syndrome    Chronic constipation    Chronic pain syndrome    followed by [facility]---  back   Clotting disorder ([place])    Phreesia 10/29/2020   Depression    Dermatitis 12/15/2011   Difficult  intubation    Educated about COVID-19 virus infection 05/14/2019   Fall at home 01/26/2016   Family history of adverse reaction to anesthesia    MOTHER--- PONV   Fatigue 09/17/2012   Fibromyalgia    FIBROMYALGIA 03/16/2008   Qualifier: Diagnosis of  By: Dierdre Harness     GERD (gastroesophageal reflux disease)    Headache disorder 01/16/2013   Headache syndrome 01/26/2016   Hip pain, right 07/08/2019   History of MRSA infection    lip abscess   History of ovarian cyst  06/2011   s/p  BSO   History of pulmonary embolus (PE) 1997   post EP with ablation pulmonary veouns for SVT/ Atrial Fib.   History of supraventricular tachycardia    s/p  ablation 1996  and 1997  by dr Caryl Comes   History of TIA (transient ischemic attack) 1997   post op EP ablation PE   Hyperlipidemia    Hyperlipidemia LDL goal <100 03/16/2008   Qualifier: Diagnosis of  By: Dierdre Harness     Hypothyroidism    followed by pcp   Incisional hernia    Insomnia 12/15/2011   Intermittent palpitations 08/22/2017   Interstitial cystitis    09-13-2018   per pt last flare-up  May 2019 (followed by pcp)   INTERSTITIAL CYSTITIS 02/17/2011   Qualifier: Diagnosis of  By: Moshe Cipro MD, Margaret     Iron deficiency anemia    09-13-2018  PER PT STABLE   Irregular heart rate 07/25/2013   Lipoma of back    upper   Low back pain radiating to right leg 07/04/2019   Low ferritin level 06/14/2014   MDD (major depressive disorder), single episode, in full remission ([place]) 10/27/2017   Medically noncompliant 02/28/2015   Multiple missed appointments, both follow-up appointments and lab appointments.    Metabolic syndrome X 08/24/33   Qualifier: Diagnosis of  By: Moshe Cipro MD, Margaret  hBA1c is 5.8 in 02/2013    Migraines    Morbid obesity ([place]) 03/27/2013   Muscle spasm 01/03/2020   Nausea alone 07/17/2014   NECK PAIN, CHRONIC 03/16/2008   Qualifier: Diagnosis of  By: Dierdre Harness     Normal coronary arteries    a. by CT 12/2018.   Obesity 03/16/2008   Qualifier: Diagnosis of  By: Dierdre Harness     Oral ulceration 08/23/2014   Presented at 06/14/2014 visit    Other malaise and fatigue 03/16/2008   Centricity Description: FATIGUE, CHRONIC Qualifier: Diagnosis of  By: Dierdre Harness   Centricity Description: FATIGUE Qualifier: Diagnosis of  By: Claybon Jabs PA, [place]     OVARIAN CYST 12/26/2009   Qualifier: Diagnosis of  By: Moshe Cipro MD, Margaret     Paget disease of breast, left [facility])    Paget's disease of breast, left  ([place]) 03/16/2008   Qualifier: Diagnosis of  By: Dierdre Harness  Left diagnosed in 2006 F/h breast cancer x 15 family members   Partial small bowel obstruction ([place]) 07/04/2012   PONV (postoperative nausea and vomiting)    SEVERE   Postsurgical menopause 11/13/2011   Presence of IVC filter 06/06/2019   PSVT (paroxysmal supraventricular tachycardia) ([place])    [place]   Recurrent oral herpes simplex infection 10/13/2017   ROM (right otitis media) 01/23/2013   S/P insertion of IVC (inferior vena caval) filter 05/08/2005   [place] (non-retrievable)  /  dx 2019  a leg of filter is protruding thru the vena cava in to right L2 vertebral  body (09-13-2018  per pt having surgery to remove filter in [place])   S/P radiofrequency ablation operation for arrhythmia 1996   1996  and 1997,   SVT and Atrial Fib   Sinusitis, chronic 01/26/2016   SMALL BOWEL OBSTRUCTION, HX OF 08/07/2008   Annotation: obstruction w/ adhesions led to partial colectomy Qualifier: Diagnosis of  By: Craige Cotta     Stroke [facility])    [place] 10/29/2020, TIAs in the past   Swelling of hand 09/17/2012   Syncope    Tachycardia 02/03/2010   Qualifier: Diagnosis of  By: Via LPN, Lynn     Thyroid disease    Phreesia 10/29/2020   Ulcer 12/15/2011   Urinary frequency 07/18/2014   Vaginitis and vulvovaginitis 05/10/2013   Vitamin D deficiency 05/05/2015   Wears glasses     Past Surgical History:  Procedure Laterality Date   ABDOMINAL HYSTERECTOMY  1987   ANTERIOR CERVICAL DECOMP/DISCECTOMY FUSION  03-07-2002   dr elsner  @[facility]    C 4 -- 5   APPENDECTOMY  1980   AUGMENTATION MAMMAPLASTY Right 2006   BILATERAL SALPINGOOPHORECTOMY  07/24/2011   via Explor. Lap. w/ intraoperative perf. bowel repair   BIOPSY  05/02/2021   Procedure: BIOPSY;  Surgeon: Montez Morita, Quillian Quince, MD;  [facility];  Service: Gastroenterology;;  small bowel, mid esophagus, distal esophagus, random colon biopsies   BREAST BIOPSY Right  2019   benign   BREAST ENHANCEMENT SURGERY Bilateral 1993   BREAST IMPLANT REMOVAL Bilateral    BREAST SURGERY N/A    Phreesia 10/29/2020   CARDIAC CATHETERIZATION  07-06-2003   dr Darnell Level brodie   normal coronaries and LVF   CARDIAC ELECTROPHYSIOLOGY STUDY AND ABLATION  1996  and 1997   CARDIOVASCULAR STRESS TEST  11-18-2015   dr Caryl Comes   normal nuclear study w/ no ischemia/  normal LV function and wall motion , ef 84%   CARPAL TUNNEL RELEASE Right ?   CESAREAN SECTION  1986   CESAREAN SECTION N/A    Phreesia 10/29/2020   CHOLECYSTECTOMY N/A    Phreesia 10/29/2020   COLON SURGERY     COLONOSCOPY WITH PROPOFOL N/A 05/02/2021   Procedure: COLONOSCOPY WITH PROPOFOL;  Surgeon: Harvel Quale, MD;  [facility];  Service: Gastroenterology;  Laterality: N/A;  12:30 PM   CYSTO/  HYDRODISTENTION/  INSTILATION THERAPY  MULTIPLE   ENTEROCUTANEOUS FISTULA CLOSURE  multiple   last one 2015 with small bowel resection   ESOPHAGOGASTRODUODENOSCOPY (EGD) WITH PROPOFOL N/A 05/02/2021   Procedure: ESOPHAGOGASTRODUODENOSCOPY (EGD) WITH PROPOFOL;  Surgeon: Harvel Quale, MD;  [facility];  Service: Gastroenterology;  Laterality: N/A;   EXPLORATORY LAPAROTOMY INCISIONAL VENTRAL HERNIA REPAIR / RESECTION SMALL BOWEL  11-09-2014   @[facility]    FRACTURE SURGERY N/A    Phreesia 10/29/2020   JOINT REPLACEMENT N/A    Phreesia 10/29/2020   LIPOMA EXCISION Right 09/16/2018   Procedure: EXCISION LIPOMA UPPER BACK;  Surgeon: Clovis Riley, MD;  Location: [place];  Service: General;  Laterality: Right;   MASTECTOMY Left 2006   w/ reconstruction on left (paget's disease)  and right breast augmentation   POLYPECTOMY  05/02/2021   Procedure: POLYPECTOMY;  Surgeon: Harvel Quale, MD;  [facility];  Service: Gastroenterology;;  gastric   SAVORY DILATION  05/02/2021   Procedure: Azzie Almas DILATION;  Surgeon: Harvel Quale, MD;   [facility];  Service: Gastroenterology;;   SMALL INTESTINE SURGERY N/A  Phreesia 10/29/2020   SPINE SURGERY N/A    Phreesia 10/29/2020   TOTAL COLECTOMY  08-04-2002    @[facility]    AND CHOLECYSTECTOMY  (colonic inertia)   TRANSTHORACIC ECHOCARDIOGRAM  02/04/2016   ef 60-65%,  grade 2 diastolic dysfunction/  mild MR   TUBAL LIGATION N/A    Phreesia 03/16/2021   VENA CAVA FILTER PLACEMENT  05/08/2005   @[facility]    [place] (non-retrievable)   WIDE EXCISION PERIRECTAL ABSCESSES  09-22-2005   @ [facility]     reports that she has never smoked. She has never used smokeless tobacco. She reports that she does not drink alcohol and does not use drugs.  Allergies  Allergen Reactions   Nitrofurantoin Hives   Crestor [Rosuvastatin Calcium] Other (See Comments)    Generalized cramps   Bisacodyl Other (See Comments)    Makes patient feel like she is having cramps Makes patient feel like she is having cramps   Clarithromycin Hives and Other (See Comments)    Other reaction(s): Other (See Comments) Cluster migraines  Cluster migraines   Clarithromycin Other (See Comments)    Cluster migraines    Clindamycin Hcl Hives   Codeine Itching   Iron Sucrose Other (See Comments)    [place]- required benadryl and solu medrol   Monosodium Glutamate Other (See Comments)    Cluster migraines  Cluster migraines   Scopolamine Hbr Other (See Comments)    Cluster migraines, impaired vision    Family History  Problem Relation Age of Onset   Diabetes Mother    Heart disease Mother    Hypertension Mother    Heart disease Father    Hyperlipidemia Father    Hypertension Father    Alcohol abuse Father    Colon cancer Maternal Aunt    Breast cancer Maternal Aunt    Cancer Maternal Uncle        mets   Bone cancer Maternal Grandfather        mets   Ovarian cancer Cousin 19   Breast cancer Cousin    Prostate cancer Maternal Uncle    Breast cancer Maternal Aunt    Brain cancer Maternal Aunt     Breast cancer Maternal Aunt     Prior to Admission medications   Medication Sig Start Date End Date Taking? Authorizing Provider  ALPRAZolam [name]) 1 MG tablet Take 1 tablet (1 mg total) by mouth 2 (two) times daily. 08/26/21  Yes Fayrene Helper, MD  aspirin 81 MG chewable tablet Chew 81 mg by mouth daily.   Yes [provider]  butalbital-acetaminophen-caffeine (FIORICET) 50-325-40 MG tablet Take 1 tablet by mouth as directed. 06/19/19  Yes [provider]  Cholecalciferol 1.25 MG (50000 UT) capsule Take 50,000 Units by mouth once a week.   Yes [provider]  conjugated estrogens (PREMARIN) vaginal cream Place fingertip amount intravaginally 2 to 3 times per week Patient taking differently: Place 0.1 mg vaginally at bedtime. Place fingertip amount intravaginally 2 to 3 times per week 07/12/20  Yes Fayrene Helper, MD  cyanocobalamin (,VITAMIN B-12,) 1000 MCG/ML injection Inject 1 mL (1,000 mcg total) into the muscle every 30 (thirty) days. 09/18/21  Yes Pennington, Rebekah M, PA-C  cyclobenzaprine (FLEXERIL) 10 MG tablet TAKE 1 TABLET BY MOUTH TWICE DAILY AS NEEDED FOR MUSCLE SPASMS 02/21/20  Yes Fayrene Helper, MD  cycloSPORINE (RESTASIS) 0.05 % ophthalmic emulsion Place 1 drop into both eyes 2 (two) times daily as needed (dry eyes).   Yes [provider]  DULoxetine (CYMBALTA) 60 MG capsule Take 60 mg by mouth daily. 04/10/21  Yes [provider]  ezetimibe (ZETIA) 10 MG tablet Take 1 tablet (10 mg total) by mouth daily. 09/28/21  Yes Fayrene Helper, MD  fluticasone (FLONASE) 50 MCG/ACT nasal spray Place 2 sprays into both nostrils daily. Patient taking differently: Place 2 sprays into both nostrils daily as needed for allergies. 10/31/20  Yes Fayrene Helper, MD  HYDROmorphone (DILAUDID) 4 MG tablet Take 4 mg by mouth in the morning, at noon, and at bedtime.   Yes [provider]  levothyroxine (SYNTHROID) 125 MCG  tablet TAKE 1 TABLET(125 MCG) BY MOUTH DAILY BEFORE BREAKFAST 08/26/21  Yes Fayrene Helper, MD  magnesium oxide (MAG-OX) 400 (240 Mg) MG tablet Take 1 tablet (400 mg total) by mouth 2 (two) times daily. Please make overdue appt with Dr. Caryl Comes before anymore refills. Thank you 1st attempt Patient taking differently: Take 400 mg by mouth daily. Please make overdue appt with Dr. Caryl Comes before anymore refills. Thank you 1st attempt 09/03/21  Yes Deboraha Sprang, MD  meclizine (ANTIVERT) 25 MG tablet Take 1 tablet (25 mg total) by mouth 3 (three) times daily as needed for dizziness. 10/31/20  Yes Fayrene Helper, MD  metoprolol tartrate (LOPRESSOR) 25 MG tablet Take 1 tablet (25 mg total) by mouth 2 (two) times daily. Patient taking differently: Take 25 mg by mouth 2 (two) times daily as needed (Afib). 01/15/21  Yes Fayrene Helper, MD  Multiple Vitamins-Minerals (ICAPS AREDS 2) CAPS Take 1 capsule by mouth at bedtime.   Yes [provider]  omeprazole (PRILOSEC) 40 MG capsule TAKE 1 CAPSULE BY MOUTH EVERY DAY 08/18/21  Yes Claudette Laws, MD  pravastatin (PRAVACHOL) 40 MG tablet TAKE 1 TABLET(40 MG) BY MOUTH DAILY Patient taking differently: Take 40 mg by mouth daily. TAKE 1 TABLET(40 MG) BY MOUTH DAILY 01/15/21  Yes Fayrene Helper, MD  promethazine (PHENERGAN) 25 MG tablet Take by mouth. 07/29/18  Yes [provider]  Semaglutide (OZEMPIC, 2 MG/DOSE, [place]) Inject into the skin. Every Monday .(For weight lose)   Yes [provider]  senna-docusate (SENOKOT-S) 8.6-50 MG per tablet Take 2 tablets by mouth 2 (two) times daily. constipation   Yes [provider]  Specialty Vitamins Products (VITAMINS FOR HAIR PO) Take 1 capsule by mouth daily.   Yes [provider]  vitamin C (ASCORBIC ACID) 500 MG tablet Take 500 mg by mouth at bedtime.   Yes [provider]  XARELTO 20 MG TABS tablet TAKE 1 TABLET(20 MG) BY MOUTH DAILY Patient taking differently:  Take 20 mg by mouth daily with supper. 10/03/21  Yes Lindell Spar, MD  Calcium Carbonate-Vitamin D (CALCIUM-VITAMIN D) 600-125 MG-UNIT TABS Take 1 tablet by mouth at bedtime. Petra Kuba made [place] Patient not taking: Reported on 11/11/2021    [provider]  cephALEXin (KEFLEX) 500 MG capsule cephalexin 500 mg capsule  TAKE 1 CAPSULE BY MOUTH TWICE DAILY Patient not taking: No sig reported    [provider]  cyclobenzaprine (FLEXERIL) 10 MG tablet Take by mouth. Patient not taking: No sig reported 11/14/15   [provider]  Dextran 70-Hypromellose (ARTIFICIAL TEARS) 0.1-0.3 % SOLN Place 1 drop into both eyes daily as needed (dry eyes). Patient not taking: No sig reported    [provider]  levothyroxine (SYNTHROID) 125 MCG tablet Take by mouth. Patient not taking: No sig reported 08/12/18   [provider]  Physical Exam: Vitals:   11/11/21 1251 11/11/21 1500 11/11/21 1529 11/11/21 1629  BP: 103/79 (!) 105/57  116/67  Pulse: (!) 106 87  86  Resp: 18 17  12   Temp: 97.9 F (36.6 C)     TempSrc: Oral     SpO2: 99% 97%  99%  Weight:   85.3 kg   Height:   5\' 4"  (1.626 m)     Constitutional: NAD, calm, comfortable Vitals:   11/11/21 1251 11/11/21 1500 11/11/21 1529 11/11/21 1629  BP: 103/79 (!) 105/57  116/67  Pulse: (!) 106 87  86  Resp: 18 17  12   Temp: 97.9 F (36.6 C)     TempSrc: Oral     SpO2: 99% 97%  99%  Weight:   85.3 kg   Height:   5\' 4"  (1.626 m)    Eyes: PERRL, lids and conjunctivae normal ENMT: Mucous membranes are very dry Neck: normal, supple, no masses, no thyromegaly Respiratory: clear to auscultation bilaterally, no wheezing, no crackles. Normal respiratory effort. No accessory muscle use.  Cardiovascular: Regular rate and rhythm, no murmurs / rubs / gallops. No extremity edema. 2+ pedal pulses.   Abdomen: Mild diffuse abdominal tenderness, extensive abdominal scarring with large supraumbilical hernia, No  hepatosplenomegaly.  Musculoskeletal: no clubbing / cyanosis. No joint deformity upper and lower extremities. Good ROM, no contractures. Normal muscle tone.  Skin: no rashes, lesions, ulcers. No induration Neurologic: No apparent cranial abnormality, moving all extremities spontaneously. Psychiatric: Normal judgment and insight. Alert and oriented x 3. Normal mood.   Labs on Admission: I have personally reviewed following labs and imaging studies  CBC: Recent Labs  Lab 11/11/21 1305  WBC 9.4  HGB 16.4*  HCT 49.0*  MCV 90.4  PLT 397   Basic Metabolic Panel: Recent Labs  Lab 11/11/21 1305 11/11/21 1317  NA 136 134*  K 3.9 3.6  CL 106 104  CO2 19* 20*  GLUCOSE 106* 107*  BUN 21* 22*  CREATININE 0.93 0.82  CALCIUM 10.0 9.9   GFR: Estimated Creatinine Clearance: 77.1 mL/min (by C-G formula based on SCr of 0.82 mg/dL). Liver Function Tests: Recent Labs  Lab 11/11/21 1317  AST 37  ALT 44  ALKPHOS 98  BILITOT 1.2  PROT 8.0  ALBUMIN 4.6   Recent Labs  Lab 11/11/21 1317  LIPASE 40    CBG: Recent Labs  Lab 11/11/21 1512  GLUCAP 95   Urine analysis:    Component Value Date/Time   COLORURINE YELLOW 11/11/2021 1652   APPEARANCEUR CLEAR 11/11/2021 1652   LABSPEC >1.046 (H) 11/11/2021 1652   PHURINE 5.0 11/11/2021 1652   GLUCOSEU NEGATIVE 11/11/2021 1652   GLUCOSEU NEG mg/dL 09/05/2007 2337   HGBUR NEGATIVE 11/11/2021 1652   HGBUR negative 03/13/2011 0957   BILIRUBINUR NEGATIVE 11/11/2021 1652   BILIRUBINUR small (A) 07/18/2021 1411   BILIRUBINUR neg 06/23/2018 1434   KETONESUR 5 (A) 11/11/2021 1652   PROTEINUR 30 (A) 11/11/2021 1652   UROBILINOGEN 4.0 (A) 07/18/2021 1411   UROBILINOGEN 0.2 07/03/2015 2014   NITRITE NEGATIVE 11/11/2021 1652   LEUKOCYTESUR NEGATIVE 11/11/2021 1652    Radiological Exams on Admission: CT ABDOMEN PELVIS W CONTRAST  Result Date: 11/11/2021 CLINICAL DATA:  Nausea, vomiting, dehydration. History of small-bowel obstruction  and prior bowel resections. EXAM: CT ABDOMEN AND PELVIS WITH CONTRAST TECHNIQUE: Multidetector CT imaging of the abdomen and pelvis was performed using the standard protocol following bolus administration of intravenous contrast. CONTRAST:  180mL OMNIPAQUE IOHEXOL 300 MG/ML  SOLN COMPARISON:  06/12/2019 FINDINGS: Lower chest: Included lung bases are clear. Heart size is normal. Right breast prosthesis. Hepatobiliary: No focal liver abnormality is seen. Status post cholecystectomy. Similar degree of postoperative intra and extrahepatic biliary dilatation. Pancreas: Unremarkable. No pancreatic ductal dilatation or surrounding inflammatory changes. Spleen: Normal in size without focal abnormality. Adrenals/Urinary Tract: Unremarkable adrenal glands. Kidneys enhance symmetrically without focal lesion, stone, or hydronephrosis. Ureters are nondilated. Urinary bladder appears unremarkable for the degree of distension. Stomach/Bowel: Stomach within normal limits. Redemonstrated postoperative changes related to prior large and small bowel resections. No dilated loops of small bowel. Mildly dilated colon within the right abdomen with air-fluid levels. Mucosal hyperenhancement within the rectum without focal thickening. Vascular/Lymphatic: No significant vascular findings are present. No enlarged abdominal or pelvic lymph nodes. Reproductive: Status post hysterectomy. No adnexal masses. Other: Large broad-based ventral midline abdominal wall hernia with postsurgical changes along the abdominal wall. No ascites. No pneumoperitoneum. Musculoskeletal: No acute or significant osseous findings. IMPRESSION: 1. Postsurgical changes to the bowel. Mildly dilated colon within the right abdomen with air-fluid levels suggestive of a diarrheal illness. Partial or developing bowel obstruction is not entirely excluded, particularly given the degree of postsurgical changes along the anterior abdominal wall. Close clinical follow-up  recommended. 2. Mucosal hyperenhancement within the rectum without focal thickening, which may represent proctitis. Electronically Signed   By: Davina Poke D.O.   On: 11/11/2021 16:12   CT Maxillofacial Wo Contrast  Result Date: 11/11/2021 CLINICAL DATA:  Right-sided facial swelling after fall this morning. EXAM: CT MAXILLOFACIAL WITHOUT CONTRAST TECHNIQUE: Multidetector CT imaging of the maxillofacial structures was performed. Multiplanar CT image reconstructions were also generated. COMPARISON:  CT head dated April 13, 2016. CT maxillofacial dated August 11, 2009. FINDINGS: Osseous: No fracture or mandibular dislocation. No destructive process. Orbits: Negative. No traumatic or inflammatory finding. Sinuses: Clear. Soft tissues: Mild right facial soft tissue swelling. Limited intracranial: No significant or unexpected finding. IMPRESSION: 1. No acute maxillofacial fracture. 2. Mild right facial soft tissue swelling. Electronically Signed   By: Titus Dubin M.D.   On: 11/11/2021 16:08    EKG: Independently reviewed.   Assessment/Plan Principal Problem:   Diarrhea   Diarrhea and vomiting-with abdominal cramping, appears to be gastroenteritis type presentation.  She is afebrile, without leukocytosis.  Blood pressure soft, systolic 397-673.  Abdominal CT-partial or developing bowel obstruction is not entirely excluded, Also proctitis suggested. -Stool C. difficile -GI pathogen panel - 1 L bolus given, continue n/s 100cc/hr x1 day -As needed 0.5 mg Dilaudid -Bowel rest with clear liquid diet  Syncope-likely due to significant GI losses.  Sustaining bruising to right cheek area,.  Maxillofacial CT without acute fracture.  Reports dizziness, blood pressure here [phone] systolic.  Echo from 2021 EF 60 to 65%. -Hydrate  Hypertension-blood pressure soft. -Hold PRN metoprolol 25 twice daily  DVT/PE -Resume Xarelto  ADHD, anxiety -Resume Xanax, Cymbalta  Fibromyalgia, chronic pain-goes  to pain clinic. -Hold home Dilaudid  DM - Hold ozempic - HGbA1c - SSI- Q6h    DVT prophylaxis: Xarelto Code Status: Full code Family Communication: None at bedside Disposition Plan:  ~ 1 - 2 days Consults called: None Admission status: Obs tele    Bethena Roys MD [facility]  11/11/2021, 7:04 PM

## 2021-11-11 NOTE — Progress Notes (Signed)
Pt arrived to room #304 via [place] from ED. Pt ambulatory from [facility] to bed with standby assist, pt states she does feel dizzy and weak at times since having the ongoing diarrhea. Pt c/o abd pain, cramping. Pt's bowel sounds audible without use of stethoscope. Pt ambulated to bathroom with assist, had large brown liquid BM with some small pieces of formed stool noted. Specimen collected. Pt states abd pain always decreases once has BM, as she feels pressure is relieved.   Oriented to room and safety procedures, states understanding. Tele on, SR 90's. Other VSS. IV site WNL, no s/s infiltration.

## 2021-11-11 NOTE — ED Triage Notes (Incomplete)
Patient has extensive history of GI problems

## 2021-11-12 ENCOUNTER — Observation Stay (HOSPITAL_COMMUNITY): Payer: Medicare Other

## 2021-11-12 DIAGNOSIS — R55 Syncope and collapse: Secondary | ICD-10-CM

## 2021-11-12 DIAGNOSIS — Z7901 Long term (current) use of anticoagulants: Secondary | ICD-10-CM | POA: Diagnosis not present

## 2021-11-12 DIAGNOSIS — E119 Type 2 diabetes mellitus without complications: Secondary | ICD-10-CM | POA: Diagnosis present

## 2021-11-12 DIAGNOSIS — Z8041 Family history of malignant neoplasm of ovary: Secondary | ICD-10-CM | POA: Diagnosis not present

## 2021-11-12 DIAGNOSIS — E039 Hypothyroidism, unspecified: Secondary | ICD-10-CM | POA: Diagnosis present

## 2021-11-12 DIAGNOSIS — R300 Dysuria: Secondary | ICD-10-CM | POA: Diagnosis not present

## 2021-11-12 DIAGNOSIS — R1033 Periumbilical pain: Secondary | ICD-10-CM | POA: Diagnosis not present

## 2021-11-12 DIAGNOSIS — E860 Dehydration: Secondary | ICD-10-CM | POA: Diagnosis present

## 2021-11-12 DIAGNOSIS — R1110 Vomiting, unspecified: Secondary | ICD-10-CM

## 2021-11-12 DIAGNOSIS — F419 Anxiety disorder, unspecified: Secondary | ICD-10-CM | POA: Diagnosis present

## 2021-11-12 DIAGNOSIS — R197 Diarrhea, unspecified: Secondary | ICD-10-CM | POA: Diagnosis not present

## 2021-11-12 DIAGNOSIS — K9189 Other postprocedural complications and disorders of digestive system: Secondary | ICD-10-CM | POA: Diagnosis not present

## 2021-11-12 DIAGNOSIS — M797 Fibromyalgia: Secondary | ICD-10-CM | POA: Diagnosis present

## 2021-11-12 DIAGNOSIS — R933 Abnormal findings on diagnostic imaging of other parts of digestive tract: Secondary | ICD-10-CM | POA: Diagnosis not present

## 2021-11-12 DIAGNOSIS — S0083XA Contusion of other part of head, initial encounter: Secondary | ICD-10-CM | POA: Diagnosis present

## 2021-11-12 DIAGNOSIS — Z803 Family history of malignant neoplasm of breast: Secondary | ICD-10-CM | POA: Diagnosis not present

## 2021-11-12 DIAGNOSIS — Z6832 Body mass index (BMI) 32.0-32.9, adult: Secondary | ICD-10-CM | POA: Diagnosis not present

## 2021-11-12 DIAGNOSIS — Z20822 Contact with and (suspected) exposure to covid-19: Secondary | ICD-10-CM | POA: Diagnosis present

## 2021-11-12 DIAGNOSIS — M25551 Pain in right hip: Secondary | ICD-10-CM | POA: Diagnosis not present

## 2021-11-12 DIAGNOSIS — E876 Hypokalemia: Secondary | ICD-10-CM | POA: Diagnosis present

## 2021-11-12 DIAGNOSIS — F909 Attention-deficit hyperactivity disorder, unspecified type: Secondary | ICD-10-CM | POA: Diagnosis present

## 2021-11-12 DIAGNOSIS — Z800 Family history of malignant neoplasm of digestive organs: Secondary | ICD-10-CM | POA: Diagnosis not present

## 2021-11-12 DIAGNOSIS — E785 Hyperlipidemia, unspecified: Secondary | ICD-10-CM | POA: Diagnosis present

## 2021-11-12 DIAGNOSIS — M7989 Other specified soft tissue disorders: Secondary | ICD-10-CM | POA: Diagnosis not present

## 2021-11-12 DIAGNOSIS — I10 Essential (primary) hypertension: Secondary | ICD-10-CM | POA: Diagnosis present

## 2021-11-12 DIAGNOSIS — M545 Low back pain, unspecified: Secondary | ICD-10-CM | POA: Diagnosis present

## 2021-11-12 DIAGNOSIS — K3184 Gastroparesis: Secondary | ICD-10-CM | POA: Diagnosis present

## 2021-11-12 DIAGNOSIS — K529 Noninfective gastroenteritis and colitis, unspecified: Secondary | ICD-10-CM | POA: Diagnosis present

## 2021-11-12 DIAGNOSIS — R1084 Generalized abdominal pain: Secondary | ICD-10-CM | POA: Diagnosis not present

## 2021-11-12 DIAGNOSIS — W19XXXA Unspecified fall, initial encounter: Secondary | ICD-10-CM | POA: Diagnosis present

## 2021-11-12 DIAGNOSIS — G894 Chronic pain syndrome: Secondary | ICD-10-CM | POA: Diagnosis present

## 2021-11-12 DIAGNOSIS — Z808 Family history of malignant neoplasm of other organs or systems: Secondary | ICD-10-CM | POA: Diagnosis not present

## 2021-11-12 LAB — GLUCOSE, CAPILLARY
Glucose-Capillary: 97 mg/dL (ref 70–99)
Glucose-Capillary: 84 mg/dL (ref 70–99)
Glucose-Capillary: 82 mg/dL (ref 70–99)
Glucose-Capillary: 80 mg/dL (ref 70–99)
Glucose-Capillary: 96 mg/dL (ref 70–99)

## 2021-11-12 LAB — CBC
HCT: 38.5 % (ref 36.0–46.0)
Hemoglobin: 12.8 g/dL (ref 12.0–15.0)
MCH: 30.4 pg (ref 26.0–34.0)
MCHC: 33.2 g/dL (ref 30.0–36.0)
MCV: 91.4 fL (ref 80.0–100.0)
Platelets: 191 10*3/uL (ref 150–400)
RBC: 4.21 MIL/uL (ref 3.87–5.11)
RDW: 13.6 % (ref 11.5–15.5)
WBC: 5.8 10*3/uL (ref 4.0–10.5)
nRBC: 0 % (ref 0.0–0.2)

## 2021-11-12 LAB — BASIC METABOLIC PANEL
Anion gap: 6 (ref 5–15)
BUN: 18 mg/dL (ref 6–20)
CO2: 21 mmol/L — ABNORMAL LOW (ref 22–32)
Calcium: 8.6 mg/dL — ABNORMAL LOW (ref 8.9–10.3)
Chloride: 111 mmol/L (ref 98–111)
Creatinine, Ser: 0.7 mg/dL (ref 0.44–1.00)
GFR, Estimated: >60 mL/min (ref >60)
Glucose, Bld: 87 mg/dL (ref 70–99)
Potassium: 3.4 mmol/L — ABNORMAL LOW (ref 3.5–5.1)
Sodium: 138 mmol/L (ref 135–145)

## 2021-11-12 LAB — ECHOCARDIOGRAM COMPLETE
AR max vel: 2.21 cm2
AV Area VTI: 2.74 cm2
AV Area mean vel: 2.45 cm2
AV Mean grad: 3 mmHg
AV Peak grad: 5.7 mmHg
Ao pk vel: 1.19 m/s
Area-P 1/2: 4.08 cm2
Height: 64 in
MV VTI: 2.64 cm2
S' Lateral: 3.15 cm
Weight: 3008 oz

## 2021-11-12 LAB — GASTROINTESTINAL PANEL BY PCR, STOOL (REPLACES STOOL CULTURE)

## 2021-11-12 LAB — HEMOGLOBIN A1C
Hgb A1c MFr Bld: 5.1 % (ref 4.8–5.6)
Mean Plasma Glucose: 99.67 mg/dL

## 2021-11-12 LAB — HIV ANTIBODY (ROUTINE TESTING W REFLEX): HIV Screen 4th Generation wRfx: NONREACTIVE

## 2021-11-12 MED ORDER — SUCRALFATE 1 GM/10ML PO SUSP
1.0000 g | Freq: Three times a day (TID) | ORAL | Status: DC
  Administered 2021-11-12 – 2021-11-18 (×20): 1 g via ORAL
  Filled 2021-11-12 (×23): qty 10

## 2021-11-12 MED ORDER — BOOST / RESOURCE BREEZE PO LIQD CUSTOM
1.0000 | Freq: Three times a day (TID) | ORAL | Status: DC
  Administered 2021-11-12 – 2021-11-13 (×2): 1 via ORAL

## 2021-11-12 MED ORDER — METOPROLOL TARTRATE 25 MG PO TABS
12.5000 mg | ORAL_TABLET | Freq: Two times a day (BID) | ORAL | Status: DC
  Administered 2021-11-13 – 2021-11-17 (×3): 12.5 mg via ORAL
  Filled 2021-11-12 (×12): qty 1

## 2021-11-12 MED ORDER — PANTOPRAZOLE SODIUM 40 MG IV SOLR
40.0000 mg | Freq: Two times a day (BID) | INTRAVENOUS | Status: DC
  Administered 2021-11-12 – 2021-11-18 (×13): 40 mg via INTRAVENOUS
  Filled 2021-11-12 (×13): qty 40

## 2021-11-12 MED ORDER — SIMETHICONE 80 MG PO CHEW
80.0000 mg | CHEWABLE_TABLET | Freq: Four times a day (QID) | ORAL | Status: AC
  Administered 2021-11-12 – 2021-11-13 (×4): 80 mg via ORAL
  Filled 2021-11-12 (×6): qty 1

## 2021-11-12 NOTE — Progress Notes (Signed)
Subjective: Patient having mid abdominal cramping intermittently today. She reports that she had a BM with presence of actual stool last night after receiving laxatives. She denies any rectal bleeding or melena, has not had any nausea or vomiting since admission.   Objective: Vital signs in last 24 hours: Temp:  [97.9 F (36.6 C)-98.4 F (36.9 C)] 98.1 F (36.7 C) (11/16 0656) Pulse Rate:  [73-106] 73 (11/16 0949) Resp:  [12-19] 19 (11/16 0656) BP: (85-116)/(54-81) 85/54 (11/16 0949) SpO2:  [95 %-99 %] 95 % (11/16 0656) Weight:  [85.3 kg-85.4 kg] 85.3 kg (11/15 1833) Last BM Date: 11/11/21 General:   Alert and oriented, pleasant Head:  Normocephalic and atraumatic. Eyes:  No icterus, sclera clear. Conjuctiva pink.  Mouth:  Without lesions, mucosa pink and moist.   Heart:  S1, S2 present, no murmurs noted.  Lungs: Clear to auscultation bilaterally, without wheezing, rales, or rhonchi.  Abdomen:  Bowel sounds present, soft, diffusely tender non-distended. No HSM noted. No rebound or guarding. No masses appreciated. Mid abdominal hernia near umbilical area that is soft and reduceable, scarring present from previous abdominal surgeries. Msk:  Symmetrical without gross deformities. Normal posture. Pulses:  Normal pulses noted. Extremities:  Without clubbing or edema. Neurologic:  Alert and  oriented x4;  grossly normal neurologically. Skin:  Warm and dry, intact without significant lesions.  Psych:  Alert and cooperative. Normal mood and affect.  Intake/Output from previous day: 11/15 0701 - 11/16 0700 In: 1737.1 [I.V.:737.1; IV Piggyback:1000] Out: -  Intake/Output this shift: No intake/output data recorded.  Lab Results: Recent Labs    11/11/21 1305 11/12/21 0517  WBC 9.4 5.8  HGB 16.4* 12.8  HCT 49.0* 38.5  PLT 243 191   BMET Recent Labs    11/11/21 1305 11/11/21 1317 11/12/21 0517  NA 136 134* 138  K 3.9 3.6 3.4*  CL 106 104 111  CO2 19* 20* 21*  GLUCOSE 106*  107* 87  BUN 21* 22* 18  CREATININE 0.93 0.82 0.70  CALCIUM 10.0 9.9 8.6*   LFT Recent Labs    11/11/21 1317  PROT 8.0  ALBUMIN 4.6  AST 37  ALT 44  ALKPHOS 98  BILITOT 1.2   Studies/Results: CT ABDOMEN PELVIS W CONTRAST  Result Date: 11/11/2021 CLINICAL DATA:  Nausea, vomiting, dehydration. History of small-bowel obstruction and prior bowel resections. EXAM: CT ABDOMEN AND PELVIS WITH CONTRAST TECHNIQUE: Multidetector CT imaging of the abdomen and pelvis was performed using the standard protocol following bolus administration of intravenous contrast. CONTRAST:  128mL OMNIPAQUE IOHEXOL 300 MG/ML  SOLN COMPARISON:  06/12/2019 FINDINGS: Lower chest: Included lung bases are clear. Heart size is normal. Right breast prosthesis. Hepatobiliary: No focal liver abnormality is seen. Status post cholecystectomy. Similar degree of postoperative intra and extrahepatic biliary dilatation. Pancreas: Unremarkable. No pancreatic ductal dilatation or surrounding inflammatory changes. Spleen: Normal in size without focal abnormality. Adrenals/Urinary Tract: Unremarkable adrenal glands. Kidneys enhance symmetrically without focal lesion, stone, or hydronephrosis. Ureters are nondilated. Urinary bladder appears unremarkable for the degree of distension. Stomach/Bowel: Stomach within normal limits. Redemonstrated postoperative changes related to prior large and small bowel resections. No dilated loops of small bowel. Mildly dilated colon within the right abdomen with air-fluid levels. Mucosal hyperenhancement within the rectum without focal thickening. Vascular/Lymphatic: No significant vascular findings are present. No enlarged abdominal or pelvic lymph nodes. Reproductive: Status post hysterectomy. No adnexal masses. Other: Large broad-based ventral midline abdominal wall hernia with postsurgical changes along the abdominal wall. No ascites. No pneumoperitoneum.  Musculoskeletal: No acute or significant osseous  findings. IMPRESSION: 1. Postsurgical changes to the bowel. Mildly dilated colon within the right abdomen with air-fluid levels suggestive of a diarrheal illness. Partial or developing bowel obstruction is not entirely excluded, particularly given the degree of postsurgical changes along the anterior abdominal wall. Close clinical follow-up recommended. 2. Mucosal hyperenhancement within the rectum without focal thickening, which may represent proctitis. Electronically Signed   By: Davina Poke D.O.   On: 11/11/2021 16:12   CT Maxillofacial Wo Contrast  Result Date: 11/11/2021 CLINICAL DATA:  Right-sided facial swelling after fall this morning. EXAM: CT MAXILLOFACIAL WITHOUT CONTRAST TECHNIQUE: Multidetector CT imaging of the maxillofacial structures was performed. Multiplanar CT image reconstructions were also generated. COMPARISON:  CT head dated April 13, 2016. CT maxillofacial dated August 11, 2009. FINDINGS: Osseous: No fracture or mandibular dislocation. No destructive process. Orbits: Negative. No traumatic or inflammatory finding. Sinuses: Clear. Soft tissues: Mild right facial soft tissue swelling. Limited intracranial: No significant or unexpected finding. IMPRESSION: 1. No acute maxillofacial fracture. 2. Mild right facial soft tissue swelling. Electronically Signed   By: Titus Dubin M.D.   On: 11/11/2021 16:08    Assessment: [name] is a 60 year old female with past medical hx significant for Paget's disease breast cancer s/p mastectomy, PE in 1997 on Xarelto and IVC filter placement s/p partial removal, anxiety, PSVT, s/p ablation, prior CVA, fibromylagia, complicated hx of partial colectomy due to redundant colon w/ primary anastomosis, perforated bowel due to gynelogic procedure complicated by enterocutaneous fistula that required multiple surgeries and multiple SBOs. Patient presented to GI office yesterday for ongoing bilious vomiting, and diarrhea, s/p partial SBO with  hospitalization on 10/25/21. She was referred to ER for further evaluation of her ongoing vomiting and diarrhea, as well as acute syncopal episode that occurred yesterday morning.  Nausea/vomiting/diarrhea/abdominal pain: Partial SBO on 10/25/21 with hospitalization at [name], treated initially with abx for suspected infectious etiology, reportedly, later ruled out, CT A/P during admission showed multiple  mildly dilated loops of small bowel in RLQ, gas and stool visualized in remaining large bowel, low-grade SBO could not be excluded, she was treated with bowel rest and d/c after a few days.   She reports she felt better until Sunday when she developed bilious vomiting and diarrhea once again with some generalized abdominal pain, not severe in nature. She denied fever, chills, melena, hematochezia or hematemesis. She had syncopal episode at home yesterday prior to GI clinic visit, likely secondary to dehydration. During visit she reported no BM or flatus since 7am yesterday morning with continued  nausea. CT A/P this admission revealed mildly dilated colon within R abdomen with air fluid levels suggestive of diarrheal illness, partial or developing SBO could not be excluded, there was also mucosal hyperenhancement within rectum w/o focal thickening, concerning for proctitis. She Has had one BM with actual presence of stool since admission, however, she required laxatives for this, denies nausea or vomiting. She is having some mid abdominal cramping. C diff testing was negative, GI Path panel is still pending. Given presence of dilated loops of bowel on CT, we will proceed with acute abdominal series for further evaluation, as there is concern for some mechanical etiology.   Last colonoscopy May 2022, prep was fair, ileum normal, patent side to side ileo-colonic anastamosis, with healthy appearing mucosa, torsioned anastomosis, non bleeding internal hemorrhoids.  Intra/extra hepatic ductal dilation: Noted on  CT A/P during admission at [facility] in Oct 2022 with recommended MRCP  for further evaluation. Patient is post cholecystectomy, LFTs remain WNL. This could be from post cholecystectomy or chronic opiate use. Lipase WNL.  Plan: Continue PPI BID Continue clear liquid diet Awaiting GI path panel results Acute abd series Continue supportive measures Anti emetics per hospitalist Hold anti diarrheals until GI Path panel results   LOS: 1 day    11/12/2021, 9:52 AM   [name] L. Alver Sorrow, MSN, APRN, AGNP-C Adult-Gerontology Nurse Practitioner [facility]

## 2021-11-12 NOTE — Progress Notes (Signed)
*  PRELIMINARY RESULTS* Echocardiogram 2D Echocardiogram has been performed.  Elpidio Anis 11/12/2021, 10:51 AM

## 2021-11-12 NOTE — Progress Notes (Signed)
Initial Nutrition Assessment  DOCUMENTATION CODES:   Obesity unspecified  INTERVENTION:  Boost Breeze po TID- pending further diet advancement  Transition to Ensure Max- BID when ready for full liquids  NUTRITION DIAGNOSIS:   Inadequate oral intake related to nausea, acute illness, poor appetite, diarrhea (Only a piece of toast since Sunday per diet recall) as evidenced by per patient/family report.  GOAL:  Patient will meet greater than or equal to 90% of their needs   MONITOR:  Diet advancement, PO intake, Supplement acceptance, Labs  REASON FOR ASSESSMENT:   Malnutrition Screening Tool    ASSESSMENT: Patient is a 60 yo female with hx of breast and ovarian cancer, partial colectomy and multiple surgeries. Hospitalized in late October for partial SBO. She presents with complaint of nausea, abdominal pain and diarrhea.   Patient is taking clear liquids for lunch. Broth, [place] ice, tea, jello. Patient has only eaten a piece of toast since Sunday and says today she is hungry and feels like she could eat steak and lobster. At home usual diet is Mediterranean and supplementing meals with Premier Protein. She has been seeing a dietitian at [facility] for weight [facility].  Medications reviewed and include:  insulin, protonix, senna-docusate.  IV- NS@ 100 ml/hr   Labs: BMP Latest Ref Rng & Units 11/12/2021 11/11/2021 11/11/2021  Glucose 70 - 99 mg/dL 87 107(H) 106(H)  BUN 6 - 20 mg/dL 18 22(H) 21(H)  Creatinine 0.44 - 1.00 mg/dL 0.70 0.82 0.93  BUN/Creat Ratio 9 - 23 - - -  Sodium 135 - 145 mmol/L 138 134(L) 136  Potassium 3.5 - 5.1 mmol/L 3.4(L) 3.6 3.9  Chloride 98 - 111 mmol/L 111 104 106  CO2 22 - 32 mmol/L 21(L) 20(L) 19(L)  Calcium 8.9 - 10.3 mg/dL 8.6(L) 9.9 10.0      NUTRITION - FOCUSED PHYSICAL EXAM:  Flowsheet Row Most Recent Value  Orbital Region Mild depletion  Upper Arm Region No depletion  Thoracic and Lumbar Region No depletion  Buccal Region No depletion  Temple  Region No depletion  Clavicle Bone Region No depletion  Clavicle and Acromion Bone Region No depletion  Scapular Bone Region No depletion  Dorsal Hand No depletion  Patellar Region No depletion  Anterior Thigh Region No depletion  Posterior Calf Region No depletion  Edema (RD Assessment) None  Hair Reviewed  Eyes Reviewed  Mouth Reviewed  Skin Reviewed  Nails Reviewed      Diet Order:   Diet Order             Diet clear liquid Room service appropriate? Yes; Fluid consistency: Thin  Diet effective now                   EDUCATION NEEDS:  Education needs have been addressed  Skin:  Skin Assessment: Reviewed RN Assessment  Last BM:  11/15  Height:   Ht Readings from Last 1 Encounters:  11/11/21 5\' 4"  (1.626 m)    Weight:   Wt Readings from Last 1 Encounters:  11/11/21 85.3 kg    Ideal Body Weight:   55 kg  BMI:  Body mass index is 32.27 kg/m.  Estimated Nutritional Needs:   Kcal:  1700-1900  Protein:  89-95 gr  Fluid:  1.7-1.9 liters daily  Colman Cater MS,RD,CSG,LDN Contact: Shea Evans

## 2021-11-12 NOTE — Progress Notes (Signed)
PROGRESS NOTE  [name] KCL:[id number] DOB: 1961-10-31 DOA: 11/11/2021 PCP: Fayrene Helper, MD  Brief History:  60 year old female with a history of fibromyalgia, ovarian cancer, left breast cancer, multiple abdominal surgeries, IVC filter March 2017, pulmonary embolus, anxiety, chronic pain syndrome, fractured IVC, hypertension, hyperlipidemia presenting with nausea, vomiting, diarrhea, and abdominal pain that began on 11/09/2021.  The patient recently was discharged from [facility] after a stay from 10/25/2021 to 10/29/2021 when she was treated for presumptive partial small bowel obstruction.  The patient was treated conservatively and improved with medical therapy.  A CT of the abdomen and pelvis during that admission showed multiple dilated small bowel loops in the right lower quadrant, but also showed stool and gas in the colon.  The patient stated that she was feeling fine when she went home until her symptoms began on 11/09/2021.  The patient states that she eats a lot of salads.  She began having worsening abdominal pain and diarrhea and vomiting.  There is no hematochezia or melena.  The patient went to see her GI physician on 11/11/2021, and she was directed to the emergency department for further evaluation.  The patient denies any fever, chills, headache, neck pain, shortness breath, cough, hemoptysis. In addition, the patient states that she has been feeling dizzy for the past 2 days.  The patient was standing up folding close when she had a syncopal episode on 11/11/2021.  This was preceded by worsening dizziness.  Assessment/Plan: Intractable nausea/vomiting/abdominal pain/Diarrhea -Doubt small bowel obstruction as the patient is having bowel movements and passing flatus -11/15 CT abd--mildly dilated colon in the right abdomen with air-fluid levels suggesting diarrheal illness.  Mucosal enhancement within the rectum.  No dilated small bowel loops. -GI  consult -Stool pathogen panel--pending -Stool for C. Difficile--negative -Clear liquid diet for now -PPI twice daily  Syncope -Remain on telemetry -Echocardiogram -Secondary to volume depletion and vasovagal response -Continue IV fluids  Chronic pain syndrome -PDMP reviewed -She is chronically on hydromorphone 4 mg, last filled 10/13/2021, #90  Anxiety -Continue home dose alprazolam 1 mg -Last refill 10/13/2021, #60 -Continue Cymbalta  DVT/PE -Continue rivaroxaban  Hypothyroidism -Continue Synthroid   Diabetes mellitus type 2 -NovoLog sliding scale -She takes Ozempic every Monday -A1C--pending  Hypertension -Continue metoprolol at lower dose       Status is: Observation  The patient will require care spanning > 2 midnights and should be moved to inpatient because: severity of illness requiring IVF, GI consult and work up and intolerance to diet       Family Communication:  no Family at bedside  Consultants:  GI  Code Status:  FULL   DVT Prophylaxis:  Xarelto   Procedures: As Listed in Progress Note Above  Antibiotics: None      Subjective:  Complains of abdominal pain with nausea and vomiting.  She has had 1 loose stool since admission.  She denies any hematochezia or melena.  Shortness of breath but has intermittent chest discomfort.  There is no headache, neck pain, fevers, chills. Objective: Vitals:   11/11/21 1629 11/11/21 1833 11/11/21 2059 11/12/21 0656  BP: 116/67 108/71 102/61 90/64  Pulse: 86 (!) 101 87 73  Resp: 12 17 19 19   Temp:  98.4 F (36.9 C) 98.1 F (36.7 C) 98.1 F (36.7 C)  TempSrc:  Oral Oral Oral  SpO2: 99% 97% 98% 95%  Weight:  85.3 kg    Height:  5\' 4"  (  1.626 m)      Intake/Output Summary (Last 24 hours) at 11/12/2021 0826 Last data filed at 11/12/2021 0300 Gross per 24 hour  Intake 1737.1 ml  Output --  Net 1737.1 ml   Weight change:  Exam:  General:  Pt is alert, follows commands appropriately,  not in acute distress HEENT: No icterus, No thrush, No neck mass, [place]/AT Cardiovascular: RRR, S1/S2, no rubs, no gallops Respiratory: CTA bilaterally, no wheezing, no crackles, no rhonchi Abdomen: Soft/+BS, non tender, non distended, no guarding Extremities: No edema, No lymphangitis, No petechiae, No rashes, no synovitis   Data Reviewed: I have personally reviewed following labs and imaging studies Basic Metabolic Panel: Recent Labs  Lab 11/11/21 1305 11/11/21 1307 11/11/21 1317 11/12/21 0517  NA 136  --  134* 138  K 3.9  --  3.6 3.4*  CL 106  --  104 111  CO2 19*  --  20* 21*  GLUCOSE 106*  --  107* 87  BUN 21*  --  22* 18  CREATININE 0.93  --  0.82 0.70  CALCIUM 10.0  --  9.9 8.6*  MG  --  1.7  --   --    Liver Function Tests: Recent Labs  Lab 11/11/21 1317  AST 37  ALT 44  ALKPHOS 98  BILITOT 1.2  PROT 8.0  ALBUMIN 4.6   Recent Labs  Lab 11/11/21 1317  LIPASE 40   No results for input(s): AMMONIA in the last 168 hours. Coagulation Profile: No results for input(s): INR, PROTIME in the last 168 hours. CBC: Recent Labs  Lab 11/11/21 1305 11/12/21 0517  WBC 9.4 5.8  HGB 16.4* 12.8  HCT 49.0* 38.5  MCV 90.4 91.4  PLT 243 191   Cardiac Enzymes: No results for input(s): CKTOTAL, CKMB, CKMBINDEX, TROPONINI in the last 168 hours. BNP: Invalid input(s): POCBNP CBG: Recent Labs  Lab 11/11/21 1512 11/11/21 2102 11/12/21 0341 11/12/21 0623 11/12/21 0745  GLUCAP 95 100* 97 84 82   HbA1C: No results for input(s): HGBA1C in the last 72 hours. Urine analysis:    Component Value Date/Time   COLORURINE YELLOW 11/11/2021 1652   APPEARANCEUR CLEAR 11/11/2021 1652   LABSPEC >1.046 (H) 11/11/2021 1652   PHURINE 5.0 11/11/2021 1652   GLUCOSEU NEGATIVE 11/11/2021 1652   GLUCOSEU NEG mg/dL 09/05/2007 2337   HGBUR NEGATIVE 11/11/2021 1652   HGBUR negative 03/13/2011 0957   BILIRUBINUR NEGATIVE 11/11/2021 1652   BILIRUBINUR small (A) 07/18/2021 1411    BILIRUBINUR neg 06/23/2018 1434   KETONESUR 5 (A) 11/11/2021 1652   PROTEINUR 30 (A) 11/11/2021 1652   UROBILINOGEN 4.0 (A) 07/18/2021 1411   UROBILINOGEN 0.2 07/03/2015 2014   NITRITE NEGATIVE 11/11/2021 1652   LEUKOCYTESUR NEGATIVE 11/11/2021 1652   Sepsis Labs: @LABRCNTIP (procalcitonin:4,lacticidven:4) ) Recent Results (from the past 240 hour(s))  Resp Panel by RT-PCR (Flu A&B, Covid) Nasopharyngeal Swab     Status: None   Collection Time: 11/11/21  4:52 PM   Specimen: Nasopharyngeal Swab; Nasopharyngeal(NP) swabs in vial transport medium  Result Value Ref Range Status   SARS Coronavirus 2 by RT PCR NEGATIVE NEGATIVE Final    Comment: (NOTE) SARS-CoV-2 target nucleic acids are NOT DETECTED.  The SARS-CoV-2 RNA is generally detectable in upper respiratory specimens during the acute phase of infection. The lowest concentration of SARS-CoV-2 viral copies this assay can detect is 138 copies/mL. A negative result does not preclude SARS-Cov-2 infection and should not be used as the sole basis for treatment or other  patient management decisions. A negative result may occur with  improper specimen collection/handling, submission of specimen other than nasopharyngeal swab, presence of viral mutation(s) within the areas targeted by this assay, and inadequate number of viral copies(<138 copies/mL). A negative result must be combined with clinical observations, patient history, and epidemiological information. The expected result is Negative.  Fact Sheet for Patients:  EntrepreneurPulse.com.au  Fact Sheet for Healthcare Providers:  IncredibleEmployment.be  This test is no t yet approved or cleared by the [place] FDA and  has been authorized for detection and/or diagnosis of SARS-CoV-2 by FDA under an Emergency Use Authorization (EUA). This EUA will remain  in effect (meaning this test can be used) for the duration of the COVID-19 declaration  under Section 564(b)(1) of the Act, 21 U.S.C.section 360bbb-3(b)(1), unless the authorization is terminated  or revoked sooner.       Influenza A by PCR NEGATIVE NEGATIVE Final   Influenza B by PCR NEGATIVE NEGATIVE Final    Comment: (NOTE) The Xpert Xpress SARS-CoV-2/FLU/RSV plus assay is intended as an aid in the diagnosis of influenza from Nasopharyngeal swab specimens and should not be used as a sole basis for treatment. Nasal washings and aspirates are unacceptable for Xpert Xpress SARS-CoV-2/FLU/RSV testing.  Fact Sheet for Patients: EntrepreneurPulse.com.au  Fact Sheet for Healthcare Providers: IncredibleEmployment.be  This test is not yet approved or cleared by the [place] FDA and has been authorized for detection and/or diagnosis of SARS-CoV-2 by FDA under an Emergency Use Authorization (EUA). This EUA will remain in effect (meaning this test can be used) for the duration of the COVID-19 declaration under Section 564(b)(1) of the Act, 21 U.S.C. section 360bbb-3(b)(1), unless the authorization is terminated or revoked.  Performed at [facility], [address] [place], [place] [address]   C Difficile Quick Screen w PCR reflex     Status: None   Collection Time: 11/11/21  7:20 PM   Specimen: Stool  Result Value Ref Range Status   C Diff antigen NEGATIVE NEGATIVE Final   C Diff toxin NEGATIVE NEGATIVE Final   C Diff interpretation No C. difficile detected.  Final    Comment: Performed at [facility], [address] [place], [place] [address]     Scheduled Meds:  ALPRAZolam  1 mg Oral BID   aspirin  81 mg Oral Daily   DULoxetine  60 mg Oral Daily   insulin aspart  0-9 Units Subcutaneous Q6H   levothyroxine  125 mcg Oral Q0600   rivaroxaban  20 mg Oral Q supper   senna-docusate  2 tablet Oral BID   Continuous Infusions:  sodium chloride 100 mL/hr at 11/11/21 2233    Procedures/Studies: CT ABDOMEN PELVIS W  CONTRAST  Result Date: 11/11/2021 CLINICAL DATA:  Nausea, vomiting, dehydration. History of small-bowel obstruction and prior bowel resections. EXAM: CT ABDOMEN AND PELVIS WITH CONTRAST TECHNIQUE: Multidetector CT imaging of the abdomen and pelvis was performed using the standard protocol following bolus administration of intravenous contrast. CONTRAST:  18mL OMNIPAQUE IOHEXOL 300 MG/ML  SOLN COMPARISON:  06/12/2019 FINDINGS: Lower chest: Included lung bases are clear. Heart size is normal. Right breast prosthesis. Hepatobiliary: No focal liver abnormality is seen. Status post cholecystectomy. Similar degree of postoperative intra and extrahepatic biliary dilatation. Pancreas: Unremarkable. No pancreatic ductal dilatation or surrounding inflammatory changes. Spleen: Normal in size without focal abnormality. Adrenals/Urinary Tract: Unremarkable adrenal glands. Kidneys enhance symmetrically without focal lesion, stone, or hydronephrosis. Ureters are nondilated. Urinary bladder appears unremarkable for the degree  of distension. Stomach/Bowel: Stomach within normal limits. Redemonstrated postoperative changes related to prior large and small bowel resections. No dilated loops of small bowel. Mildly dilated colon within the right abdomen with air-fluid levels. Mucosal hyperenhancement within the rectum without focal thickening. Vascular/Lymphatic: No significant vascular findings are present. No enlarged abdominal or pelvic lymph nodes. Reproductive: Status post hysterectomy. No adnexal masses. Other: Large broad-based ventral midline abdominal wall hernia with postsurgical changes along the abdominal wall. No ascites. No pneumoperitoneum. Musculoskeletal: No acute or significant osseous findings. IMPRESSION: 1. Postsurgical changes to the bowel. Mildly dilated colon within the right abdomen with air-fluid levels suggestive of a diarrheal illness. Partial or developing bowel obstruction is not entirely excluded,  particularly given the degree of postsurgical changes along the anterior abdominal wall. Close clinical follow-up recommended. 2. Mucosal hyperenhancement within the rectum without focal thickening, which may represent proctitis. Electronically Signed   By: Davina Poke D.O.   On: 11/11/2021 16:12   CT Maxillofacial Wo Contrast  Result Date: 11/11/2021 CLINICAL DATA:  Right-sided facial swelling after fall this morning. EXAM: CT MAXILLOFACIAL WITHOUT CONTRAST TECHNIQUE: Multidetector CT imaging of the maxillofacial structures was performed. Multiplanar CT image reconstructions were also generated. COMPARISON:  CT head dated April 13, 2016. CT maxillofacial dated August 11, 2009. FINDINGS: Osseous: No fracture or mandibular dislocation. No destructive process. Orbits: Negative. No traumatic or inflammatory finding. Sinuses: Clear. Soft tissues: Mild right facial soft tissue swelling. Limited intracranial: No significant or unexpected finding. IMPRESSION: 1. No acute maxillofacial fracture. 2. Mild right facial soft tissue swelling. Electronically Signed   By: Titus Dubin M.D.   On: 11/11/2021 16:08    Orson Eva, DO  [facility]  If 7PM-7AM, please contact night-coverage [email or web address] Password [email or web address] 11/12/2021, 8:26 AM   LOS: 1 day

## 2021-11-13 DIAGNOSIS — G894 Chronic pain syndrome: Secondary | ICD-10-CM | POA: Diagnosis not present

## 2021-11-13 DIAGNOSIS — K9189 Other postprocedural complications and disorders of digestive system: Secondary | ICD-10-CM

## 2021-11-13 DIAGNOSIS — R55 Syncope and collapse: Secondary | ICD-10-CM | POA: Diagnosis not present

## 2021-11-13 DIAGNOSIS — R1110 Vomiting, unspecified: Secondary | ICD-10-CM | POA: Diagnosis not present

## 2021-11-13 LAB — URINALYSIS, ROUTINE W REFLEX MICROSCOPIC
Bilirubin Urine: NEGATIVE
Glucose, UA: NEGATIVE mg/dL
Hgb urine dipstick: NEGATIVE
Ketones, ur: NEGATIVE mg/dL
Leukocytes,Ua: NEGATIVE
Nitrite: NEGATIVE
Protein, ur: NEGATIVE mg/dL
Specific Gravity, Urine: 1.009 (ref 1.005–1.030)
pH: 6 (ref 5.0–8.0)

## 2021-11-13 LAB — CBC
HCT: 40.7 % (ref 36.0–46.0)
Hemoglobin: 13.2 g/dL (ref 12.0–15.0)
MCH: 30 pg (ref 26.0–34.0)
MCHC: 32.4 g/dL (ref 30.0–36.0)
MCV: 92.5 fL (ref 80.0–100.0)
Platelets: 172 10*3/uL (ref 150–400)
RBC: 4.4 MIL/uL (ref 3.87–5.11)
RDW: 13.4 % (ref 11.5–15.5)
WBC: 5.6 10*3/uL (ref 4.0–10.5)
nRBC: 0 % (ref 0.0–0.2)

## 2021-11-13 LAB — COMPREHENSIVE METABOLIC PANEL
ALT: 31 U/L (ref 0–44)
AST: 26 U/L (ref 15–41)
Albumin: 3.2 g/dL — ABNORMAL LOW (ref 3.5–5.0)
Alkaline Phosphatase: 77 U/L (ref 38–126)
Anion gap: 7 (ref 5–15)
BUN: 10 mg/dL (ref 6–20)
CO2: 21 mmol/L — ABNORMAL LOW (ref 22–32)
Calcium: 8.8 mg/dL — ABNORMAL LOW (ref 8.9–10.3)
Chloride: 112 mmol/L — ABNORMAL HIGH (ref 98–111)
Creatinine, Ser: 0.57 mg/dL (ref 0.44–1.00)
GFR, Estimated: >60 mL/min (ref >60)
Glucose, Bld: 97 mg/dL (ref 70–99)
Potassium: 3.2 mmol/L — ABNORMAL LOW (ref 3.5–5.1)
Sodium: 140 mmol/L (ref 135–145)
Total Bilirubin: 0.5 mg/dL (ref 0.3–1.2)
Total Protein: 5.6 g/dL — ABNORMAL LOW (ref 6.5–8.1)

## 2021-11-13 LAB — MAGNESIUM: Magnesium: 1.7 mg/dL (ref 1.7–2.4)

## 2021-11-13 LAB — GLUCOSE, CAPILLARY
Glucose-Capillary: 76 mg/dL (ref 70–99)
Glucose-Capillary: 77 mg/dL (ref 70–99)
Glucose-Capillary: 87 mg/dL (ref 70–99)
Glucose-Capillary: 89 mg/dL (ref 70–99)

## 2021-11-13 MED ORDER — ONDANSETRON HCL 4 MG/2ML IJ SOLN
4.0000 mg | INTRAMUSCULAR | Status: AC
  Filled 2021-11-13: qty 2

## 2021-11-13 MED ORDER — SODIUM CHLORIDE 0.9 % IV SOLN
1.0000 g | INTRAVENOUS | Status: DC
  Administered 2021-11-13: 04:00:00 1 g via INTRAVENOUS
  Filled 2021-11-13: qty 10

## 2021-11-13 MED ORDER — PHENAZOPYRIDINE HCL 100 MG PO TABS
200.0000 mg | ORAL_TABLET | Freq: Three times a day (TID) | ORAL | Status: DC
  Administered 2021-11-13 – 2021-11-18 (×13): 200 mg via ORAL
  Filled 2021-11-13 (×16): qty 2

## 2021-11-13 MED ORDER — ESTROGENS CONJUGATED 0.625 MG/GM VA CREA
1.0000 | TOPICAL_CREAM | Freq: Every day | VAGINAL | Status: DC
  Administered 2021-11-17: 1 via VAGINAL
  Filled 2021-11-13 (×2): qty 30

## 2021-11-13 MED ORDER — POTASSIUM CHLORIDE 10 MEQ/100ML IV SOLN
10.0000 meq | INTRAVENOUS | Status: AC
  Administered 2021-11-13 (×4): 10 meq via INTRAVENOUS
  Filled 2021-11-13 (×4): qty 100

## 2021-11-13 MED ORDER — SODIUM CHLORIDE 0.9 % IV SOLN: INTRAVENOUS | Status: DC

## 2021-11-13 NOTE — Progress Notes (Signed)
RN called due to patient complaining of burning sensation on urination which started since yesterday, patient states that symptom was similar to prior symptom when she had UTI.  Urinalysis and urine culture were ordered.  Patient was started on IV ceftriaxone.

## 2021-11-13 NOTE — Progress Notes (Addendum)
PROGRESS NOTE  [name] FHL:[id number] DOB: 1961/02/10 DOA: 11/11/2021 PCP: Fayrene Helper, MD  Brief History:  60 year old female with a history of fibromyalgia, ovarian cancer, left breast cancer, multiple abdominal surgeries, IVC filter March 2017, pulmonary embolus, anxiety, chronic pain syndrome, fractured IVC, hypertension, hyperlipidemia presenting with nausea, vomiting, diarrhea, and abdominal pain that began on 11/09/2021.  The patient recently was discharged from [facility] after a stay from 10/25/2021 to 10/29/2021 when she was treated for presumptive partial small bowel obstruction.  The patient was treated conservatively and improved with medical therapy.  A CT of the abdomen and pelvis during that admission showed multiple dilated small bowel loops in the right lower quadrant, but also showed stool and gas in the colon.  The patient stated that she was feeling fine when she went home until her symptoms began on 11/09/2021.  The patient states that she eats a lot of salads.  She began having worsening abdominal pain and diarrhea and vomiting.  There is no hematochezia or melena.  The patient went to see her GI physician on 11/11/2021, and she was directed to the emergency department for further evaluation.  The patient denies any fever, chills, headache, neck pain, shortness breath, cough, hemoptysis. In addition, the patient states that she has been feeling dizzy for the past 2 days.  The patient was standing up folding close when she had a syncopal episode on 11/11/2021.  This was preceded by worsening dizziness.   Assessment/Plan: Intractable nausea/vomiting/abdominal pain/Diarrhea -Doubt small bowel obstruction as the patient is having bowel movements and passing flatus -11/17--had one large BM -11/15 CT abd--mildly dilated colon in the right abdomen with air-fluid levels suggesting diarrheal illness.  Mucosal enhancement within the rectum.  No dilated small  bowel loops. -GI consult appreciated>>concerned about SB stricture -11/18--planning SB follow through study -Stool pathogen panel--neg -Stool for C. Difficile--negative -Clear liquid diet for now>>full liquids -PPI twice daily -carafate started -continue IV dilaudid as needed for pain   Syncope -Remain on telemetry--no concerning dysrhythmia -Echocardiogram--EF 60-65%, no WMA, trivial MR -Secondary to volume depletion and vasovagal response -Continue IV fluids   Chronic pain syndrome -PDMP reviewed -She is chronically on hydromorphone 4 mg, last filled 10/13/2021, #90   Anxiety -Continue home dose alprazolam 1 mg -Last refill 10/13/2021, #60 -Continue Cymbalta   DVT/PE -Continue rivaroxaban   Hypothyroidism -Continue Synthroid    Diabetes mellitus type 2 -NovoLog sliding scale>>discontinue -She takes Ozempic every Monday -A1C--5.1   Hypertension -Continue metoprolol at lower dose   Dysuria -UA not suggestive UTI -d/c ceftriaxone -pyridium -restart premarin cream   Hypokalemia -replete -check mag  Right Hip Pain -overall improved -11/16 xray hip--Subtle linear lucency within the right inferior pubic ramus, indeterminate for fracture -11/15 CT abd/pelvis--no pelvic fracture -with no interim fall, doubt clinical significance of plain xray finding       Status is: Inpatient   The patient will require care spanning > 2 midnights and should be moved to inpatient because: severity of illness requiring IVF, GI consult and work up and intolerance to diet             Family Communication:  no Family at bedside   Consultants:  GI   Code Status:  FULL    DVT Prophylaxis:  Xarelto     Procedures: As Listed in Progress Note Above   Antibiotics: None        Subjective: Patient had large BM today.  Tolerating full liquids.  Denies f/c, cp, sob, n/v.  Still complains of abd pain, moderate, about same  Objective: Vitals:   11/12/21 1349  11/12/21 2100 11/13/21 0540 11/13/21 0835  BP: (!) 88/70 (!) 86/51 99/66 91/71   Pulse: 83 81 77 79  Resp: 16 18 19    Temp: 97.6 F (36.4 C) 98.7 F (37.1 C) 98.1 F (36.7 C)   TempSrc: Oral Oral Oral   SpO2: 99%  98%   Weight:      Height:        Intake/Output Summary (Last 24 hours) at 11/13/2021 1721 Last data filed at 11/13/2021 1521 Gross per 24 hour  Intake 2874.72 ml  Output 3 ml  Net 2871.72 ml   Weight change:  Exam:  General:  Pt is alert, follows commands appropriately, not in acute distress HEENT: No icterus, No thrush, No neck mass, [place]/AT Cardiovascular: RRR, S1/S2, no rubs, no gallops Respiratory: CTA bilaterally, no wheezing, no crackles, no rhonchi Abdomen: Soft/+BS, mid abd tender, non distended, no guarding Extremities: No edema, No lymphangitis, No petechiae, No rashes, no synovitis   Data Reviewed: I have personally reviewed following labs and imaging studies Basic Metabolic Panel: Recent Labs  Lab 11/11/21 1305 11/11/21 1307 11/11/21 1317 11/12/21 0517 11/13/21 0611  NA 136  --  134* 138 140  K 3.9  --  3.6 3.4* 3.2*  CL 106  --  104 111 112*  CO2 19*  --  20* 21* 21*  GLUCOSE 106*  --  107* 87 97  BUN 21*  --  22* 18 10  CREATININE 0.93  --  0.82 0.70 0.57  CALCIUM 10.0  --  9.9 8.6* 8.8*  MG  --  1.7  --   --  1.7   Liver Function Tests: Recent Labs  Lab 11/11/21 1317 11/13/21 0611  AST 37 26  ALT 44 31  ALKPHOS 98 77  BILITOT 1.2 0.5  PROT 8.0 5.6*  ALBUMIN 4.6 3.2*   Recent Labs  Lab 11/11/21 1317  LIPASE 40   No results for input(s): AMMONIA in the last 168 hours. Coagulation Profile: No results for input(s): INR, PROTIME in the last 168 hours. CBC: Recent Labs  Lab 11/11/21 1305 11/12/21 0517 11/13/21 0611  WBC 9.4 5.8 5.6  HGB 16.4* 12.8 13.2  HCT 49.0* 38.5 40.7  MCV 90.4 91.4 92.5  PLT 243 191 172   Cardiac Enzymes: No results for input(s): CKTOTAL, CKMB, CKMBINDEX, TROPONINI in the last 168  hours. BNP: Invalid input(s): POCBNP CBG: Recent Labs  Lab 11/12/21 1607 11/13/21 0002 11/13/21 0543 11/13/21 1116 11/13/21 1614  GLUCAP 96 87 89 77 76   HbA1C: Recent Labs    11/12/21 0517  HGBA1C 5.1   Urine analysis:    Component Value Date/Time   COLORURINE YELLOW 11/13/2021 0630   APPEARANCEUR CLEAR 11/13/2021 0630   LABSPEC 1.009 11/13/2021 0630   PHURINE 6.0 11/13/2021 0630   GLUCOSEU NEGATIVE 11/13/2021 0630   GLUCOSEU NEG mg/dL 09/05/2007 2337   HGBUR NEGATIVE 11/13/2021 0630   HGBUR negative 03/13/2011 0957   BILIRUBINUR NEGATIVE 11/13/2021 0630   BILIRUBINUR small (A) 07/18/2021 1411   BILIRUBINUR neg 06/23/2018 1434   KETONESUR NEGATIVE 11/13/2021 0630   PROTEINUR NEGATIVE 11/13/2021 0630   UROBILINOGEN 4.0 (A) 07/18/2021 1411   UROBILINOGEN 0.2 07/03/2015 2014   NITRITE NEGATIVE 11/13/2021 0630   LEUKOCYTESUR NEGATIVE 11/13/2021 0630   Sepsis Labs: @LABRCNTIP (procalcitonin:4,lacticidven:4) ) Recent Results (from the past 240 hour(s))  Resp Panel  by RT-PCR (Flu A&B, Covid) Nasopharyngeal Swab     Status: None   Collection Time: 11/11/21  4:52 PM   Specimen: Nasopharyngeal Swab; Nasopharyngeal(NP) swabs in vial transport medium  Result Value Ref Range Status   SARS Coronavirus 2 by RT PCR NEGATIVE NEGATIVE Final    Comment: (NOTE) SARS-CoV-2 target nucleic acids are NOT DETECTED.  The SARS-CoV-2 RNA is generally detectable in upper respiratory specimens during the acute phase of infection. The lowest concentration of SARS-CoV-2 viral copies this assay can detect is 138 copies/mL. A negative result does not preclude SARS-Cov-2 infection and should not be used as the sole basis for treatment or other patient management decisions. A negative result may occur with  improper specimen collection/handling, submission of specimen other than nasopharyngeal swab, presence of viral mutation(s) within the areas targeted by this assay, and inadequate number  of viral copies(<138 copies/mL). A negative result must be combined with clinical observations, patient history, and epidemiological information. The expected result is Negative.  Fact Sheet for Patients:  EntrepreneurPulse.com.au  Fact Sheet for Healthcare Providers:  IncredibleEmployment.be  This test is no t yet approved or cleared by the [place] FDA and  has been authorized for detection and/or diagnosis of SARS-CoV-2 by FDA under an Emergency Use Authorization (EUA). This EUA will remain  in effect (meaning this test can be used) for the duration of the COVID-19 declaration under Section 564(b)(1) of the Act, 21 U.S.C.section 360bbb-3(b)(1), unless the authorization is terminated  or revoked sooner.       Influenza A by PCR NEGATIVE NEGATIVE Final   Influenza B by PCR NEGATIVE NEGATIVE Final    Comment: (NOTE) The Xpert Xpress SARS-CoV-2/FLU/RSV plus assay is intended as an aid in the diagnosis of influenza from Nasopharyngeal swab specimens and should not be used as a sole basis for treatment. Nasal washings and aspirates are unacceptable for Xpert Xpress SARS-CoV-2/FLU/RSV testing.  Fact Sheet for Patients: EntrepreneurPulse.com.au  Fact Sheet for Healthcare Providers: IncredibleEmployment.be  This test is not yet approved or cleared by the [place] FDA and has been authorized for detection and/or diagnosis of SARS-CoV-2 by FDA under an Emergency Use Authorization (EUA). This EUA will remain in effect (meaning this test can be used) for the duration of the COVID-19 declaration under Section 564(b)(1) of the Act, 21 U.S.C. section 360bbb-3(b)(1), unless the authorization is terminated or revoked.  Performed at [facility], [address], [place], [place] [address]   Gastrointestinal Panel by PCR , Stool     Status: None   Collection Time: 11/11/21  7:20 PM   Specimen: Stool  Result  Value Ref Range Status   Campylobacter species NOT DETECTED NOT DETECTED Final   Plesimonas shigelloides NOT DETECTED NOT DETECTED Final   Salmonella species NOT DETECTED NOT DETECTED Final   Yersinia enterocolitica NOT DETECTED NOT DETECTED Final   Vibrio species NOT DETECTED NOT DETECTED Final   Vibrio cholerae NOT DETECTED NOT DETECTED Final   Enteroaggregative E coli (EAEC) NOT DETECTED NOT DETECTED Final   Enteropathogenic E coli (EPEC) NOT DETECTED NOT DETECTED Final   Enterotoxigenic E coli (ETEC) NOT DETECTED NOT DETECTED Final   Shiga like toxin producing E coli (STEC) NOT DETECTED NOT DETECTED Final   Shigella/Enteroinvasive E coli (EIEC) NOT DETECTED NOT DETECTED Final   Cryptosporidium NOT DETECTED NOT DETECTED Final   Cyclospora cayetanensis NOT DETECTED NOT DETECTED Final   Entamoeba histolytica NOT DETECTED NOT DETECTED Final   Giardia lamblia NOT DETECTED NOT DETECTED Final  Adenovirus F40/41 NOT DETECTED NOT DETECTED Final   Astrovirus NOT DETECTED NOT DETECTED Final   Norovirus GI/GII NOT DETECTED NOT DETECTED Final   Rotavirus A NOT DETECTED NOT DETECTED Final   Sapovirus (I, II, IV, and V) NOT DETECTED NOT DETECTED Final    Comment: Performed at [facility], [address] [place], [place] [address]  C Difficile Quick Screen w PCR reflex     Status: None   Collection Time: 11/11/21  7:20 PM   Specimen: Stool  Result Value Ref Range Status   C Diff antigen NEGATIVE NEGATIVE Final   C Diff toxin NEGATIVE NEGATIVE Final   C Diff interpretation No C. difficile detected.  Final    Comment: Performed at [facility], [address], [place], [place] [address]     Scheduled Meds:  ALPRAZolam  1 mg Oral BID   aspirin  81 mg Oral Daily   DULoxetine  60 mg Oral Daily   feeding supplement  1 Container Oral TID BM   insulin aspart  0-9 Units Subcutaneous Q6H   levothyroxine  125 mcg Oral Q0600   metoprolol tartrate  12.5 mg Oral BID   [START ON 11/14/2021]  ondansetron (ZOFRAN) IV  4 mg Intravenous On Call   pantoprazole (PROTONIX) IV  40 mg Intravenous Q12H   phenazopyridine  200 mg Oral TID WC   rivaroxaban  20 mg Oral Q supper   senna-docusate  2 tablet Oral BID   sucralfate  1 g Oral TID WC & HS   Continuous Infusions:  cefTRIAXone (ROCEPHIN)  IV Stopped (11/13/21 0425)    Procedures/Studies: CT ABDOMEN PELVIS W CONTRAST  Result Date: 11/11/2021 CLINICAL DATA:  Nausea, vomiting, dehydration. History of small-bowel obstruction and prior bowel resections. EXAM: CT ABDOMEN AND PELVIS WITH CONTRAST TECHNIQUE: Multidetector CT imaging of the abdomen and pelvis was performed using the standard protocol following bolus administration of intravenous contrast. CONTRAST:  159mL OMNIPAQUE IOHEXOL 300 MG/ML  SOLN COMPARISON:  06/12/2019 FINDINGS: Lower chest: Included lung bases are clear. Heart size is normal. Right breast prosthesis. Hepatobiliary: No focal liver abnormality is seen. Status post cholecystectomy. Similar degree of postoperative intra and extrahepatic biliary dilatation. Pancreas: Unremarkable. No pancreatic ductal dilatation or surrounding inflammatory changes. Spleen: Normal in size without focal abnormality. Adrenals/Urinary Tract: Unremarkable adrenal glands. Kidneys enhance symmetrically without focal lesion, stone, or hydronephrosis. Ureters are nondilated. Urinary bladder appears unremarkable for the degree of distension. Stomach/Bowel: Stomach within normal limits. Redemonstrated postoperative changes related to prior large and small bowel resections. No dilated loops of small bowel. Mildly dilated colon within the right abdomen with air-fluid levels. Mucosal hyperenhancement within the rectum without focal thickening. Vascular/Lymphatic: No significant vascular findings are present. No enlarged abdominal or pelvic lymph nodes. Reproductive: Status post hysterectomy. No adnexal masses. Other: Large broad-based ventral midline abdominal  wall hernia with postsurgical changes along the abdominal wall. No ascites. No pneumoperitoneum. Musculoskeletal: No acute or significant osseous findings. IMPRESSION: 1. Postsurgical changes to the bowel. Mildly dilated colon within the right abdomen with air-fluid levels suggestive of a diarrheal illness. Partial or developing bowel obstruction is not entirely excluded, particularly given the degree of postsurgical changes along the anterior abdominal wall. Close clinical follow-up recommended. 2. Mucosal hyperenhancement within the rectum without focal thickening, which may represent proctitis. Electronically Signed   By: Davina Poke D.O.   On: 11/11/2021 16:12   DG ABD ACUTE 2+V W 1V CHEST  Result Date: 11/12/2021 CLINICAL DATA:  Abdominal pain, weakness,  chest pain EXAM: DG ABDOMEN ACUTE WITH 1 VIEW CHEST COMPARISON:  CT 11/11/2021, chest x-ray 04/13/2016 FINDINGS: Similar appearance of mildly dilated colon, predominantly in the right hemiabdomen. There is no evidence of free intraperitoneal air. Numerous surgical clips in the low abdomen and left upper quadrant. No radiopaque calculi or other significant radiographic abnormality is seen. Heart size and mediastinal contours are within normal limits. Chronic scarring in the left mid lung. Insert clear IMPRESSION: 1. Similar appearance of mildly dilated colon, predominantly in the right hemiabdomen. 2. No acute cardiopulmonary disease. Electronically Signed   By: Davina Poke D.O.   On: 11/12/2021 13:58   ECHOCARDIOGRAM COMPLETE  Result Date: 11/12/2021    ECHOCARDIOGRAM REPORT   Patient Name:   CLEMENCIA HELZER Date of Exam: 11/12/2021 Medical Rec #:  [id number]             Height:       64.0 in Accession #:    [id number]            Weight:       188.0 lb Date of Birth:  06-09-1961             BSA:          1.906 m Patient Age:    71 years              BP:           90/64 mmHg Patient Gender: F                     HR:           73 bpm.  Exam Location:  [facility] Procedure: 2D Echo, Cardiac Doppler and Color Doppler Indications:    Syncope  History:        Patient has prior history of Echocardiogram examinations, most                 recent 09/17/2020. Signs/Symptoms:Chest Pain and Syncope; Risk                 Factors:Hypertension and Dyslipidemia.  Sonographer:    Wenda Low Referring Phys: [phone] Coree Brame  Sonographer Comments: Suboptimal apical window and patient is morbidly obese. IMPRESSIONS  1. Left ventricular ejection fraction, by estimation, is 60 to 65%. The left ventricle has normal function. The left ventricle has no regional wall motion abnormalities. Left ventricular diastolic parameters were normal.  2. Right ventricular systolic function is normal. The right ventricular size is normal. There is normal pulmonary artery systolic pressure. The estimated right ventricular systolic pressure is 56.3 mmHg.  3. The mitral valve is grossly normal. Trivial mitral valve regurgitation.  4. The aortic valve is tricuspid. Aortic valve regurgitation is not visualized. Aortic valve mean gradient measures 3.0 mmHg. Comparison(s): No significant change from prior study. Prior images reviewed side by side. FINDINGS  Left Ventricle: Left ventricular ejection fraction, by estimation, is 60 to 65%. The left ventricle has normal function. The left ventricle has no regional wall motion abnormalities. The left ventricular internal cavity size was normal in size. There is  no left ventricular hypertrophy. Left ventricular diastolic parameters were normal. Right Ventricle: The right ventricular size is normal. No increase in right ventricular wall thickness. Right ventricular systolic function is normal. There is normal pulmonary artery systolic pressure. The tricuspid regurgitant velocity is 2.16 m/s, and  with an assumed right atrial pressure of 8 mmHg, the estimated right ventricular systolic pressure is 26.7  mmHg. Left Atrium: Left atrial size was  normal in size. Right Atrium: Right atrial size was normal in size. Pericardium: There is no evidence of pericardial effusion. Presence of epicardial fat layer. Mitral Valve: The mitral valve is grossly normal. Trivial mitral valve regurgitation. MV peak gradient, 2.4 mmHg. The mean mitral valve gradient is 1.0 mmHg. Tricuspid Valve: The tricuspid valve is grossly normal. Tricuspid valve regurgitation is trivial. Aortic Valve: The aortic valve is tricuspid. There is mild aortic valve annular calcification. Aortic valve regurgitation is not visualized. Aortic valve mean gradient measures 3.0 mmHg. Aortic valve peak gradient measures 5.7 mmHg. Aortic valve area, by  VTI measures 2.74 cm. Pulmonic Valve: The pulmonic valve was grossly normal. Pulmonic valve regurgitation is trivial. Aorta: The aortic root is normal in size and structure. IAS/Shunts: No atrial level shunt detected by color flow Doppler.  LEFT VENTRICLE PLAX 2D LVIDd:         4.90 cm   Diastology LVIDs:         3.15 cm   LV e' medial:    7.18 cm/s LV PW:         0.90 cm   LV E/e' medial:  9.0 LV IVS:        1.00 cm   LV e' lateral:   8.38 cm/s LVOT diam:     2.00 cm   LV E/e' lateral: 7.7 LV SV:         55 LV SV Index:   29 LVOT Area:     3.14 cm  LEFT ATRIUM             Index        RIGHT ATRIUM          Index LA diam:        3.60 cm 1.89 cm/m   RA Area:     9.95 cm LA Vol (A2C):   30.2 ml 15.85 ml/m  RA Volume:   18.30 ml 9.60 ml/m LA Vol (A4C):   22.1 ml 11.60 ml/m LA Biplane Vol: 26.6 ml 13.96 ml/m  AORTIC VALVE                    PULMONIC VALVE AV Area (Vmax):    2.21 cm     PV Vmax:       0.81 m/s AV Area (Vmean):   2.45 cm     PV Peak grad:  2.6 mmHg AV Area (VTI):     2.74 cm AV Vmax:           119.00 cm/s AV Vmean:          74.800 cm/s AV VTI:            0.202 m AV Peak Grad:      5.7 mmHg AV Mean Grad:      3.0 mmHg LVOT Vmax:         83.60 cm/s LVOT Vmean:        58.400 cm/s LVOT VTI:          0.176 m LVOT/AV VTI ratio: 0.87  AORTA  Ao Root diam: 2.90 cm MITRAL VALVE               TRICUSPID VALVE MV Area (PHT): 4.08 cm    TR Peak grad:   18.7 mmHg MV Area VTI:   2.64 cm    TR Vmax:        216.00 cm/s MV Peak grad:  2.4 mmHg MV  Mean grad:  1.0 mmHg    SHUNTS MV Vmax:       0.78 m/s    Systemic VTI:  0.18 m MV Vmean:      48.9 cm/s   Systemic Diam: 2.00 cm MV Decel Time: 186 msec MV E velocity: 64.70 cm/s MV A velocity: 58.70 cm/s MV E/A ratio:  1.10 Rozann Lesches MD Electronically signed by Rozann Lesches MD Signature Date/Time: 11/12/2021/12:33:54 PM    Final    DG HIP UNILAT WITH PELVIS 2-3 VIEWS RIGHT  Result Date: 11/12/2021 CLINICAL DATA:  Fall, right hip pain EXAM: DG HIP (WITH OR WITHOUT PELVIS) 2-3V RIGHT COMPARISON:  CT 11/11/2021 FINDINGS: Subtle linear lucency within the right inferior pubic ramus, indeterminate for fracture. Osseous structures are otherwise intact. Hip joint intact without dislocation. Right hip joint space is maintained. There is soft tissue swelling along the lateral aspect of the hip at the level of the greater trochanter. IMPRESSION: 1. Subtle linear lucency within the right inferior pubic ramus, indeterminate for fracture. No fracture was present at this location on CT performed 1 day ago. 2. Soft tissue swelling along the lateral aspect of the hip. Electronically Signed   By: Davina Poke D.O.   On: 11/12/2021 12:37   CT Maxillofacial Wo Contrast  Result Date: 11/11/2021 CLINICAL DATA:  Right-sided facial swelling after fall this morning. EXAM: CT MAXILLOFACIAL WITHOUT CONTRAST TECHNIQUE: Multidetector CT imaging of the maxillofacial structures was performed. Multiplanar CT image reconstructions were also generated. COMPARISON:  CT head dated April 13, 2016. CT maxillofacial dated August 11, 2009. FINDINGS: Osseous: No fracture or mandibular dislocation. No destructive process. Orbits: Negative. No traumatic or inflammatory finding. Sinuses: Clear. Soft tissues: Mild right facial soft tissue  swelling. Limited intracranial: No significant or unexpected finding. IMPRESSION: 1. No acute maxillofacial fracture. 2. Mild right facial soft tissue swelling. Electronically Signed   By: Titus Dubin M.D.   On: 11/11/2021 16:08    Orson Eva, DO  [facility]  If 7PM-7AM, please contact night-coverage [email or web address] Password [email or web address] 11/13/2021, 5:21 PM   LOS: 2 days

## 2021-11-13 NOTE — Progress Notes (Signed)
Subjective:  Complains of terrible night of abdominal pain and dysuria associated with chills. Nausea but no vomiting. Abdominal pain in center of abdomin and radiated into back, some better today. Comes and goes. Large BM this morning per patient. No melena, brbpr.   Objective: Vital signs in last 24 hours: Temp:  [97.6 F (36.4 C)-98.7 F (37.1 C)] 98.1 F (36.7 C) (11/17 0540) Pulse Rate:  [73-83] 77 (11/17 0540) Resp:  [16-19] 19 (11/17 0540) BP: (85-99)/(51-70) 99/66 (11/17 0540) SpO2:  [98 %-99 %] 98 % (11/17 0540) Last BM Date: 11/11/21 General:   Alert,  Well-developed, well-nourished, pleasant and cooperative in NAD Head:  Normocephalic and atraumatic. Eyes:  Sclera clear, no icterus.  Abdomen:  Soft, hyperactive bowel sounds with some mild tympany in upper right abdomen. Nondistended. Easily reducible ventral hernia with mild tenderness. Mild lower abdominal tenderness. Without guarding, and without rebound.   Extremities:  Without clubbing, deformity or edema. Neurologic:  Alert and  oriented x4;  grossly normal neurologically. Skin:  Intact without significant lesions or rashes. Psych:  Alert and cooperative. Normal mood and affect.  Intake/Output from previous day: 11/16 0701 - 11/17 0700 In: 2772.4 [P.O.:1080; I.V.:1592.4; IV Piggyback:100] Out: 3 [Urine:3] Intake/Output this shift: No intake/output data recorded.  Lab Results: CBC Recent Labs    11/11/21 1305 11/12/21 0517 11/13/21 0611  WBC 9.4 5.8 5.6  HGB 16.4* 12.8 13.2  HCT 49.0* 38.5 40.7  MCV 90.4 91.4 92.5  PLT 243 191 172   BMET Recent Labs    11/11/21 1317 11/12/21 0517 11/13/21 0611  NA 134* 138 140  K 3.6 3.4* 3.2*  CL 104 111 112*  CO2 20* 21* 21*  GLUCOSE 107* 87 97  BUN 22* 18 10  CREATININE 0.82 0.70 0.57  CALCIUM 9.9 8.6* 8.8*   LFTs Recent Labs    11/11/21 1317 11/13/21 0611  BILITOT 1.2 0.5  ALKPHOS 98 77  AST 37 26  ALT 44 31  PROT 8.0 5.6*  ALBUMIN 4.6 3.2*    Recent Labs    11/11/21 1317  LIPASE 40   PT/INR No results for input(s): LABPROT, INR in the last 72 hours.    Imaging Studies: CT ABDOMEN PELVIS W CONTRAST  Result Date: 11/11/2021 CLINICAL DATA:  Nausea, vomiting, dehydration. History of small-bowel obstruction and prior bowel resections. EXAM: CT ABDOMEN AND PELVIS WITH CONTRAST TECHNIQUE: Multidetector CT imaging of the abdomen and pelvis was performed using the standard protocol following bolus administration of intravenous contrast. CONTRAST:  112mL OMNIPAQUE IOHEXOL 300 MG/ML  SOLN COMPARISON:  06/12/2019 FINDINGS: Lower chest: Included lung bases are clear. Heart size is normal. Right breast prosthesis. Hepatobiliary: No focal liver abnormality is seen. Status post cholecystectomy. Similar degree of postoperative intra and extrahepatic biliary dilatation. Pancreas: Unremarkable. No pancreatic ductal dilatation or surrounding inflammatory changes. Spleen: Normal in size without focal abnormality. Adrenals/Urinary Tract: Unremarkable adrenal glands. Kidneys enhance symmetrically without focal lesion, stone, or hydronephrosis. Ureters are nondilated. Urinary bladder appears unremarkable for the degree of distension. Stomach/Bowel: Stomach within normal limits. Redemonstrated postoperative changes related to prior large and small bowel resections. No dilated loops of small bowel. Mildly dilated colon within the right abdomen with air-fluid levels. Mucosal hyperenhancement within the rectum without focal thickening. Vascular/Lymphatic: No significant vascular findings are present. No enlarged abdominal or pelvic lymph nodes. Reproductive: Status post hysterectomy. No adnexal masses. Other: Large broad-based ventral midline abdominal wall hernia with postsurgical changes along the abdominal wall. No ascites. No pneumoperitoneum. Musculoskeletal: No  acute or significant osseous findings. IMPRESSION: 1. Postsurgical changes to the bowel. Mildly  dilated colon within the right abdomen with air-fluid levels suggestive of a diarrheal illness. Partial or developing bowel obstruction is not entirely excluded, particularly given the degree of postsurgical changes along the anterior abdominal wall. Close clinical follow-up recommended. 2. Mucosal hyperenhancement within the rectum without focal thickening, which may represent proctitis. Electronically Signed   By: Davina Poke D.O.   On: 11/11/2021 16:12   DG ABD ACUTE 2+V W 1V CHEST  Result Date: 11/12/2021 CLINICAL DATA:  Abdominal pain, weakness, chest pain EXAM: DG ABDOMEN ACUTE WITH 1 VIEW CHEST COMPARISON:  CT 11/11/2021, chest x-ray 04/13/2016 FINDINGS: Similar appearance of mildly dilated colon, predominantly in the right hemiabdomen. There is no evidence of free intraperitoneal air. Numerous surgical clips in the low abdomen and left upper quadrant. No radiopaque calculi or other significant radiographic abnormality is seen. Heart size and mediastinal contours are within normal limits. Chronic scarring in the left mid lung. Insert clear IMPRESSION: 1. Similar appearance of mildly dilated colon, predominantly in the right hemiabdomen. 2. No acute cardiopulmonary disease. Electronically Signed   By: Davina Poke D.O.   On: 11/12/2021 13:58   ECHOCARDIOGRAM COMPLETE  Result Date: 11/12/2021    ECHOCARDIOGRAM REPORT   Patient Name:   [name] Date of Exam: 11/12/2021 Medical Rec #:  [id number]             Height:       64.0 in Accession #:    [id number]            Weight:       188.0 lb Date of Birth:  03/19/1961             BSA:          1.906 m Patient Age:    60 years              BP:           90/64 mmHg Patient Gender: F                     HR:           73 bpm. Exam Location:  [facility] Procedure: 2D Echo, Cardiac Doppler and Color Doppler Indications:    Syncope  History:        Patient has prior history of Echocardiogram examinations, most                 recent  09/17/2020. Signs/Symptoms:Chest Pain and Syncope; Risk                 Factors:Hypertension and Dyslipidemia.  Sonographer:    Wenda Low Referring Phys: [phone] DAVID TAT  Sonographer Comments: Suboptimal apical window and patient is morbidly obese. IMPRESSIONS  1. Left ventricular ejection fraction, by estimation, is 60 to 65%. The left ventricle has normal function. The left ventricle has no regional wall motion abnormalities. Left ventricular diastolic parameters were normal.  2. Right ventricular systolic function is normal. The right ventricular size is normal. There is normal pulmonary artery systolic pressure. The estimated right ventricular systolic pressure is 92.3 mmHg.  3. The mitral valve is grossly normal. Trivial mitral valve regurgitation.  4. The aortic valve is tricuspid. Aortic valve regurgitation is not visualized. Aortic valve mean gradient measures 3.0 mmHg. Comparison(s): No significant change from prior study. Prior images reviewed side by side. FINDINGS  Left Ventricle: Left ventricular ejection  fraction, by estimation, is 60 to 65%. The left ventricle has normal function. The left ventricle has no regional wall motion abnormalities. The left ventricular internal cavity size was normal in size. There is  no left ventricular hypertrophy. Left ventricular diastolic parameters were normal. Right Ventricle: The right ventricular size is normal. No increase in right ventricular wall thickness. Right ventricular systolic function is normal. There is normal pulmonary artery systolic pressure. The tricuspid regurgitant velocity is 2.16 m/s, and  with an assumed right atrial pressure of 8 mmHg, the estimated right ventricular systolic pressure is 70.6 mmHg. Left Atrium: Left atrial size was normal in size. Right Atrium: Right atrial size was normal in size. Pericardium: There is no evidence of pericardial effusion. Presence of epicardial fat layer. Mitral Valve: The mitral valve is grossly normal.  Trivial mitral valve regurgitation. MV peak gradient, 2.4 mmHg. The mean mitral valve gradient is 1.0 mmHg. Tricuspid Valve: The tricuspid valve is grossly normal. Tricuspid valve regurgitation is trivial. Aortic Valve: The aortic valve is tricuspid. There is mild aortic valve annular calcification. Aortic valve regurgitation is not visualized. Aortic valve mean gradient measures 3.0 mmHg. Aortic valve peak gradient measures 5.7 mmHg. Aortic valve area, by  VTI measures 2.74 cm. Pulmonic Valve: The pulmonic valve was grossly normal. Pulmonic valve regurgitation is trivial. Aorta: The aortic root is normal in size and structure. IAS/Shunts: No atrial level shunt detected by color flow Doppler.  LEFT VENTRICLE PLAX 2D LVIDd:         4.90 cm   Diastology LVIDs:         3.15 cm   LV e' medial:    7.18 cm/s LV PW:         0.90 cm   LV E/e' medial:  9.0 LV IVS:        1.00 cm   LV e' lateral:   8.38 cm/s LVOT diam:     2.00 cm   LV E/e' lateral: 7.7 LV SV:         55 LV SV Index:   29 LVOT Area:     3.14 cm  LEFT ATRIUM             Index        RIGHT ATRIUM          Index LA diam:        3.60 cm 1.89 cm/m   RA Area:     9.95 cm LA Vol (A2C):   30.2 ml 15.85 ml/m  RA Volume:   18.30 ml 9.60 ml/m LA Vol (A4C):   22.1 ml 11.60 ml/m LA Biplane Vol: 26.6 ml 13.96 ml/m  AORTIC VALVE                    PULMONIC VALVE AV Area (Vmax):    2.21 cm     PV Vmax:       0.81 m/s AV Area (Vmean):   2.45 cm     PV Peak grad:  2.6 mmHg AV Area (VTI):     2.74 cm AV Vmax:           119.00 cm/s AV Vmean:          74.800 cm/s AV VTI:            0.202 m AV Peak Grad:      5.7 mmHg AV Mean Grad:      3.0 mmHg LVOT Vmax:  83.60 cm/s LVOT Vmean:        58.400 cm/s LVOT VTI:          0.176 m LVOT/AV VTI ratio: 0.87  AORTA Ao Root diam: 2.90 cm MITRAL VALVE               TRICUSPID VALVE MV Area (PHT): 4.08 cm    TR Peak grad:   18.7 mmHg MV Area VTI:   2.64 cm    TR Vmax:        216.00 cm/s MV Peak grad:  2.4 mmHg MV Mean grad:   1.0 mmHg    SHUNTS MV Vmax:       0.78 m/s    Systemic VTI:  0.18 m MV Vmean:      48.9 cm/s   Systemic Diam: 2.00 cm MV Decel Time: 186 msec MV E velocity: 64.70 cm/s MV A velocity: 58.70 cm/s MV E/A ratio:  1.10 Barbara Lesches MD Electronically signed by Barbara Lesches MD Signature Date/Time: 11/12/2021/12:33:54 PM    Final    DG HIP UNILAT WITH PELVIS 2-3 VIEWS RIGHT  Result Date: 11/12/2021 CLINICAL DATA:  Fall, right hip pain EXAM: DG HIP (WITH OR WITHOUT PELVIS) 2-3V RIGHT COMPARISON:  CT 11/11/2021 FINDINGS: Subtle linear lucency within the right inferior pubic ramus, indeterminate for fracture. Osseous structures are otherwise intact. Hip joint intact without dislocation. Right hip joint space is maintained. There is soft tissue swelling along the lateral aspect of the hip at the level of the greater trochanter. IMPRESSION: 1. Subtle linear lucency within the right inferior pubic ramus, indeterminate for fracture. No fracture was present at this location on CT performed 1 day ago. 2. Soft tissue swelling along the lateral aspect of the hip. Electronically Signed   By: Davina Poke D.O.   On: 11/12/2021 12:37   CT Maxillofacial Wo Contrast  Result Date: 11/11/2021 CLINICAL DATA:  Right-sided facial swelling after fall this morning. EXAM: CT MAXILLOFACIAL WITHOUT CONTRAST TECHNIQUE: Multidetector CT imaging of the maxillofacial structures was performed. Multiplanar CT image reconstructions were also generated. COMPARISON:  CT head dated April 13, 2016. CT maxillofacial dated August 11, 2009. FINDINGS: Osseous: No fracture or mandibular dislocation. No destructive process. Orbits: Negative. No traumatic or inflammatory finding. Sinuses: Clear. Soft tissues: Mild right facial soft tissue swelling. Limited intracranial: No significant or unexpected finding. IMPRESSION: 1. No acute maxillofacial fracture. 2. Mild right facial soft tissue swelling. Electronically Signed   By: Titus Dubin M.D.    On: 11/11/2021 16:08  [2 weeks]   Assessment: [name] is a 60 year old female with past medical hx significant for Paget's disease breast cancer s/p mastectomy, PE in 1997 on Xarelto and IVC filter placement s/p partial removal, anxiety, PSVT s/p ablation, prior CVA, fibromylagia, complicated hx of partial colectomy due to redundant colon w/ primary anastomosis, perforated bowel due to gynelogic procedure complicated by enterocutaneous fistula that required multiple surgeries and multiple SBOs. Patient presented to GI office day of admission for ongoing bilious vomiting, and diarrhea, s/p partial SBO with hospitalization on 10/25/21. She was referred to ER for further evaluation of her ongoing vomiting and diarrhea, as well as acute syncopal episode that occurred yesterday morning.  Nausea/vomiting/diarrhea/abdominal pain: Recent admission October 29 at [facility] for partial small bowel obstruction, CT during admission showed multiple mildly dilated loops of small bowel in the right lower quadrant, gas and stool visualized in the remaining large bowel, low-grade SBO cannot be excluded.  Patient was treated with  bowel rest.  Symptoms returned over the weekend, developing bilious vomiting and diarrhea with generalized abdominal pain.  Syncopal episode at home felt to be secondary to dehydration.  BMs and flatus had stopped around 7 AM the day prior to admission.  Current CT with mildly dilated colon within the right abdomen with air-fluid levels suggestive of diarrheal illness, partial or developing small bowel obstruction cannot be excluded, findings concerning for proctitis.  C. difficile testing negative.  GI pathogen panel negative. There is concern for possible small bowel stricture due to adhesions unless dilated small bowel is chronic for her.  Given negative stool studies, would consider small bowel imaging.  Last colonoscopy May 2022, prep was fair.  Ileum normal, patent side-to-side  ileocolonic anastomosis, healthy appearing mucosa, torsioned anastomosis, nonbleeding internal hemorrhoids.  Intra/extrahepatic biliary ductal dilation: Chronic finding, patient is status postcholecystectomy, LFTs are normal.  Lipase normal.  This could be secondary to postcholecystectomy and/or chronic opioid use  Dysuria: U/A this morning normal. Culture pending. Started on rocephin this morning. Further management per attending.   Plan: Continue carafate, simethicone.  Continue PPI, senna. Consider small bowel imaging once patient able to tolerate contrast.  F/U pending urine culture.   Laureen Ochs. Bernarda Caffey [facility] [phone] 11/17/20229:26 AM    LOS: 2 days

## 2021-11-13 NOTE — Plan of Care (Signed)

## 2021-11-13 NOTE — H&P (Deleted)
RN called due to patient complaining of burning sensation on urination which started since yesterday, patient states that symptom was similar to prior symptom when she had UTI.  Urinalysis and urine culture were ordered.  Patient was started on IV ceftriaxone.

## 2021-11-14 ENCOUNTER — Inpatient Hospital Stay (HOSPITAL_COMMUNITY): Payer: Medicare Other

## 2021-11-14 DIAGNOSIS — R1033 Periumbilical pain: Secondary | ICD-10-CM

## 2021-11-14 DIAGNOSIS — R933 Abnormal findings on diagnostic imaging of other parts of digestive tract: Secondary | ICD-10-CM

## 2021-11-14 DIAGNOSIS — R197 Diarrhea, unspecified: Secondary | ICD-10-CM | POA: Diagnosis not present

## 2021-11-14 DIAGNOSIS — K9189 Other postprocedural complications and disorders of digestive system: Secondary | ICD-10-CM | POA: Diagnosis not present

## 2021-11-14 DIAGNOSIS — M25551 Pain in right hip: Secondary | ICD-10-CM

## 2021-11-14 DIAGNOSIS — I10 Essential (primary) hypertension: Secondary | ICD-10-CM | POA: Diagnosis not present

## 2021-11-14 DIAGNOSIS — G894 Chronic pain syndrome: Secondary | ICD-10-CM | POA: Diagnosis not present

## 2021-11-14 LAB — GLUCOSE, CAPILLARY
Glucose-Capillary: 79 mg/dL (ref 70–99)
Glucose-Capillary: 85 mg/dL (ref 70–99)
Glucose-Capillary: 89 mg/dL (ref 70–99)
Glucose-Capillary: 91 mg/dL (ref 70–99)

## 2021-11-14 MED ORDER — HYDROMORPHONE HCL 1 MG/ML IJ SOLN
0.5000 mg | Freq: Once | INTRAMUSCULAR | Status: AC
  Administered 2021-11-14: 0.5 mg via INTRAVENOUS
  Filled 2021-11-14: qty 0.5

## 2021-11-14 NOTE — Progress Notes (Addendum)
PROGRESS NOTE  [name] BJY:[id number] DOB: [birth year] DOA: 11/11/2021 PCP: Fayrene Helper, MD  Brief History:  60 year old female with a history of fibromyalgia, ovarian cancer, left breast cancer, multiple abdominal surgeries, IVC filter March 2017, pulmonary embolus, anxiety, chronic pain syndrome, fractured IVC, hypertension, hyperlipidemia presenting with nausea, vomiting, diarrhea, and abdominal pain that began on 11/09/2021.  The patient recently was discharged from [facility] after a stay from 10/25/2021 to 10/29/2021 when she was treated for presumptive partial small bowel obstruction.  The patient was treated conservatively and improved with medical therapy.  A CT of the abdomen and pelvis during that admission showed multiple dilated small bowel loops in the right lower quadrant, but also showed stool and gas in the colon.  The patient stated that she was feeling fine when she went home until her symptoms began on 11/09/2021.  The patient states that she eats a lot of salads.  She began having worsening abdominal pain and diarrhea and vomiting.  There is no hematochezia or melena.  The patient went to see her GI physician on 11/11/2021, and she was directed to the emergency department for further evaluation.  The patient denies any fever, chills, headache, neck pain, shortness breath, cough, hemoptysis. In addition, the patient states that she has been feeling dizzy for the past 2 days.  The patient was standing up folding close when she had a syncopal episode on 11/11/2021.  This was preceded by worsening dizziness.   Assessment/Plan: Intractable nausea/vomiting/abdominal pain/Diarrhea -Doubt small bowel obstruction as the patient is having bowel movements and passing flatus -11/17--had one large BM -11/18--had BM -11/15 CT abd--mildly dilated colon in the right abdomen with air-fluid levels suggesting diarrheal illness.  Mucosal enhancement within the rectum.  No  dilated small bowel loops. -GI consult appreciated>>concerned about SB stricture -11/18--planning SB follow through study -11/18--SBFT>>Barium is slow to transit through the small bowel. Barium filled loops in the right upper quadrant likely dilated ileum related to small-bowel obstruction -Stool pathogen panel--neg -Stool for C. Difficile--negative -Clear liquid diet for now>>full liquids>>heart healthy diet on 11/18 -PPI twice daily -carafate started -continue IV dilaudid as needed for pain -suspect she has narcotic bowel syndrome -11/18--discussed with Dr. Levora Dredge for home 11/19 if tolerates diet   Syncope -Remain on telemetry--no concerning dysrhythmia -Echocardiogram--EF 60-65%, no WMA, trivial MR -Secondary to volume depletion and vasovagal response -Continue IV fluids   Chronic pain syndrome -PDMP reviewed -She is chronically on hydromorphone 4 mg, last filled 10/13/2021, #90   Anxiety -Continue home dose alprazolam 1 mg -Last refill 10/13/2021, #60 -Continue Cymbalta   DVT/PE -Continue rivaroxaban   Hypothyroidism -Continue Synthroid    Diabetes mellitus type 2 -NovoLog sliding scale>>discontinue -She takes Ozempic every Monday and metformin for weight loss -A1C--5.1   Hypertension -Continue metoprolol at lower dose   Dysuria -UA not suggestive UTI -d/c ceftriaxone -pyridium -restarted premarin cream   Hypokalemia -replete -check mag   Right Hip Pain -overall improved; able to ambulate without difficulty -11/16 xray hip--Subtle linear lucency within the right inferior pubic ramus, indeterminate for fracture -11/15 CT abd/pelvis--no pelvic fracture -with no interim fall, doubt clinical significance of plain xray finding  Obesity -BMI 32.27       Status is: Inpatient   The patient will require care spanning > 2 midnights and should be moved to inpatient because: severity of illness requiring IVF, GI consult and work up and intolerance to  diet  Family Communication:  no Family at bedside   Consultants:  GI   Code Status:  FULL    DVT Prophylaxis:  Xarelto     Procedures: As Listed in Progress Note Above   Antibiotics: None     Subjective: Pt states she had BM in last 24 hours.  States abd pain is about same, but no worse.  Denies f/c, cp, sob, n/v  Objective: Vitals:   11/13/21 0835 11/13/21 2155 11/14/21 0545 11/14/21 1209  BP: 91/71 (!) 117/57 (!) 102/55 139/70  Pulse: 79 77 74 80  Resp:  16 16 17   Temp:  98.7 F (37.1 C) 98.6 F (37 C) 98.4 F (36.9 C)  TempSrc:  Oral Oral   SpO2:  98% 98% 99%  Weight:      Height:        Intake/Output Summary (Last 24 hours) at 11/14/2021 1613 Last data filed at 11/14/2021 1504 Gross per 24 hour  Intake 2364.27 ml  Output --  Net 2364.27 ml   Weight change:  Exam:  General:  Pt is alert, follows commands appropriately, not in acute distress HEENT: No icterus, No thrush, No neck mass, [place]/AT Cardiovascular: RRR, S1/S2, no rubs, no gallops Respiratory: CTA bilaterally, no wheezing, no crackles, no rhonchi Abdomen: Soft/+BS, periumbilical tender, non distended, no guarding Extremities: No edema, No lymphangitis, No petechiae, No rashes, no synovitis   Data Reviewed: I have personally reviewed following labs and imaging studies Basic Metabolic Panel: Recent Labs  Lab 11/11/21 1305 11/11/21 1307 11/11/21 1317 11/12/21 0517 11/13/21 0611  NA 136  --  134* 138 140  K 3.9  --  3.6 3.4* 3.2*  CL 106  --  104 111 112*  CO2 19*  --  20* 21* 21*  GLUCOSE 106*  --  107* 87 97  BUN 21*  --  22* 18 10  CREATININE 0.93  --  0.82 0.70 0.57  CALCIUM 10.0  --  9.9 8.6* 8.8*  MG  --  1.7  --   --  1.7   Liver Function Tests: Recent Labs  Lab 11/11/21 1317 11/13/21 0611  AST 37 26  ALT 44 31  ALKPHOS 98 77  BILITOT 1.2 0.5  PROT 8.0 5.6*  ALBUMIN 4.6 3.2*   Recent Labs  Lab 11/11/21 1317  LIPASE 40   No results for input(s):  AMMONIA in the last 168 hours. Coagulation Profile: No results for input(s): INR, PROTIME in the last 168 hours. CBC: Recent Labs  Lab 11/11/21 1305 11/12/21 0517 11/13/21 0611  WBC 9.4 5.8 5.6  HGB 16.4* 12.8 13.2  HCT 49.0* 38.5 40.7  MCV 90.4 91.4 92.5  PLT 243 191 172   Cardiac Enzymes: No results for input(s): CKTOTAL, CKMB, CKMBINDEX, TROPONINI in the last 168 hours. BNP: Invalid input(s): POCBNP CBG: Recent Labs  Lab 11/13/21 1116 11/13/21 1614 11/14/21 0011 11/14/21 0555 11/14/21 1132  GLUCAP 77 76 91 79 89   HbA1C: Recent Labs    11/12/21 0517  HGBA1C 5.1   Urine analysis:    Component Value Date/Time   COLORURINE YELLOW 11/13/2021 0630   APPEARANCEUR CLEAR 11/13/2021 0630   LABSPEC 1.009 11/13/2021 0630   PHURINE 6.0 11/13/2021 0630   GLUCOSEU NEGATIVE 11/13/2021 0630   GLUCOSEU NEG mg/dL 09/05/2007 2337   HGBUR NEGATIVE 11/13/2021 0630   HGBUR negative 03/13/2011 0957   BILIRUBINUR NEGATIVE 11/13/2021 0630   BILIRUBINUR small (A) 07/18/2021 1411   BILIRUBINUR neg 06/23/2018 1434   KETONESUR NEGATIVE  11/13/2021 0630   PROTEINUR NEGATIVE 11/13/2021 0630   UROBILINOGEN 4.0 (A) 07/18/2021 1411   UROBILINOGEN 0.2 07/03/2015 2014   NITRITE NEGATIVE 11/13/2021 0630   LEUKOCYTESUR NEGATIVE 11/13/2021 0630   Sepsis Labs: @LABRCNTIP (procalcitonin:4,lacticidven:4) ) Recent Results (from the past 240 hour(s))  Resp Panel by RT-PCR (Flu A&B, Covid) Nasopharyngeal Swab     Status: None   Collection Time: 11/11/21  4:52 PM   Specimen: Nasopharyngeal Swab; Nasopharyngeal(NP) swabs in vial transport medium  Result Value Ref Range Status   SARS Coronavirus 2 by RT PCR NEGATIVE NEGATIVE Final    Comment: (NOTE) SARS-CoV-2 target nucleic acids are NOT DETECTED.  The SARS-CoV-2 RNA is generally detectable in upper respiratory specimens during the acute phase of infection. The lowest concentration of SARS-CoV-2 viral copies this assay can detect is 138  copies/mL. A negative result does not preclude SARS-Cov-2 infection and should not be used as the sole basis for treatment or other patient management decisions. A negative result may occur with  improper specimen collection/handling, submission of specimen other than nasopharyngeal swab, presence of viral mutation(s) within the areas targeted by this assay, and inadequate number of viral copies(<138 copies/mL). A negative result must be combined with clinical observations, patient history, and epidemiological information. The expected result is Negative.  Fact Sheet for Patients:  EntrepreneurPulse.com.au  Fact Sheet for Healthcare Providers:  IncredibleEmployment.be  This test is no t yet approved or cleared by the [place] FDA and  has been authorized for detection and/or diagnosis of SARS-CoV-2 by FDA under an Emergency Use Authorization (EUA). This EUA will remain  in effect (meaning this test can be used) for the duration of the COVID-19 declaration under Section 564(b)(1) of the Act, 21 U.S.C.section 360bbb-3(b)(1), unless the authorization is terminated  or revoked sooner.       Influenza A by PCR NEGATIVE NEGATIVE Final   Influenza B by PCR NEGATIVE NEGATIVE Final    Comment: (NOTE) The Xpert Xpress SARS-CoV-2/FLU/RSV plus assay is intended as an aid in the diagnosis of influenza from Nasopharyngeal swab specimens and should not be used as a sole basis for treatment. Nasal washings and aspirates are unacceptable for Xpert Xpress SARS-CoV-2/FLU/RSV testing.  Fact Sheet for Patients: EntrepreneurPulse.com.au  Fact Sheet for Healthcare Providers: IncredibleEmployment.be  This test is not yet approved or cleared by the [place] FDA and has been authorized for detection and/or diagnosis of SARS-CoV-2 by FDA under an Emergency Use Authorization (EUA). This EUA will remain in effect (meaning  this test can be used) for the duration of the COVID-19 declaration under Section 564(b)(1) of the Act, 21 U.S.C. section 360bbb-3(b)(1), unless the authorization is terminated or revoked.  Performed at [facility], [address], [place], [place] [address]   Gastrointestinal Panel by PCR , Stool     Status: None   Collection Time: 11/11/21  7:20 PM   Specimen: Stool  Result Value Ref Range Status   Campylobacter species NOT DETECTED NOT DETECTED Final   Plesimonas shigelloides NOT DETECTED NOT DETECTED Final   Salmonella species NOT DETECTED NOT DETECTED Final   Yersinia enterocolitica NOT DETECTED NOT DETECTED Final   Vibrio species NOT DETECTED NOT DETECTED Final   Vibrio cholerae NOT DETECTED NOT DETECTED Final   Enteroaggregative E coli (EAEC) NOT DETECTED NOT DETECTED Final   Enteropathogenic E coli (EPEC) NOT DETECTED NOT DETECTED Final   Enterotoxigenic E coli (ETEC) NOT DETECTED NOT DETECTED Final   Shiga like toxin producing E coli (STEC) NOT DETECTED  NOT DETECTED Final   Shigella/Enteroinvasive E coli (EIEC) NOT DETECTED NOT DETECTED Final   Cryptosporidium NOT DETECTED NOT DETECTED Final   Cyclospora cayetanensis NOT DETECTED NOT DETECTED Final   Entamoeba histolytica NOT DETECTED NOT DETECTED Final   Giardia lamblia NOT DETECTED NOT DETECTED Final   Adenovirus F40/41 NOT DETECTED NOT DETECTED Final   Astrovirus NOT DETECTED NOT DETECTED Final   Norovirus GI/GII NOT DETECTED NOT DETECTED Final   Rotavirus A NOT DETECTED NOT DETECTED Final   Sapovirus (I, II, IV, and V) NOT DETECTED NOT DETECTED Final    Comment: Performed at [facility] [address], [place], [place] [address]  C Difficile Quick Screen w PCR reflex     Status: None   Collection Time: 11/11/21  7:20 PM   Specimen: Stool  Result Value Ref Range Status   C Diff antigen NEGATIVE NEGATIVE Final   C Diff toxin NEGATIVE NEGATIVE Final   C Diff interpretation No C. difficile detected.  Final     Comment: Performed at [facility], [address], [place], [place] [address]     Scheduled Meds:  ALPRAZolam  1 mg Oral BID   aspirin  81 mg Oral Daily   conjugated estrogens  1 Applicatorful Vaginal Daily   DULoxetine  60 mg Oral Daily   feeding supplement  1 Container Oral TID BM   insulin aspart  0-9 Units Subcutaneous Q6H   levothyroxine  125 mcg Oral Q0600   metoprolol tartrate  12.5 mg Oral BID   ondansetron (ZOFRAN) IV  4 mg Intravenous On Call   pantoprazole (PROTONIX) IV  40 mg Intravenous Q12H   phenazopyridine  200 mg Oral TID WC   rivaroxaban  20 mg Oral Q supper   senna-docusate  2 tablet Oral BID   sucralfate  1 g Oral TID WC & HS   Continuous Infusions:  sodium chloride 50 mL/hr at 11/14/21 1504    Procedures/Studies: CT ABDOMEN PELVIS W CONTRAST  Result Date: 11/11/2021 CLINICAL DATA:  Nausea, vomiting, dehydration. History of small-bowel obstruction and prior bowel resections. EXAM: CT ABDOMEN AND PELVIS WITH CONTRAST TECHNIQUE: Multidetector CT imaging of the abdomen and pelvis was performed using the standard protocol following bolus administration of intravenous contrast. CONTRAST:  143mL OMNIPAQUE IOHEXOL 300 MG/ML  SOLN COMPARISON:  06/12/2019 FINDINGS: Lower chest: Included lung bases are clear. Heart size is normal. Right breast prosthesis. Hepatobiliary: No focal liver abnormality is seen. Status post cholecystectomy. Similar degree of postoperative intra and extrahepatic biliary dilatation. Pancreas: Unremarkable. No pancreatic ductal dilatation or surrounding inflammatory changes. Spleen: Normal in size without focal abnormality. Adrenals/Urinary Tract: Unremarkable adrenal glands. Kidneys enhance symmetrically without focal lesion, stone, or hydronephrosis. Ureters are nondilated. Urinary bladder appears unremarkable for the degree of distension. Stomach/Bowel: Stomach within normal limits. Redemonstrated postoperative changes related to prior large and  small bowel resections. No dilated loops of small bowel. Mildly dilated colon within the right abdomen with air-fluid levels. Mucosal hyperenhancement within the rectum without focal thickening. Vascular/Lymphatic: No significant vascular findings are present. No enlarged abdominal or pelvic lymph nodes. Reproductive: Status post hysterectomy. No adnexal masses. Other: Large broad-based ventral midline abdominal wall hernia with postsurgical changes along the abdominal wall. No ascites. No pneumoperitoneum. Musculoskeletal: No acute or significant osseous findings. IMPRESSION: 1. Postsurgical changes to the bowel. Mildly dilated colon within the right abdomen with air-fluid levels suggestive of a diarrheal illness. Partial or developing bowel obstruction is not entirely excluded, particularly given the degree of  postsurgical changes along the anterior abdominal wall. Close clinical follow-up recommended. 2. Mucosal hyperenhancement within the rectum without focal thickening, which may represent proctitis. Electronically Signed   By: Davina Poke D.O.   On: 11/11/2021 16:12   DG ABD ACUTE 2+V W 1V CHEST  Result Date: 11/12/2021 CLINICAL DATA:  Abdominal pain, weakness, chest pain EXAM: DG ABDOMEN ACUTE WITH 1 VIEW CHEST COMPARISON:  CT 11/11/2021, chest x-ray 04/13/2016 FINDINGS: Similar appearance of mildly dilated colon, predominantly in the right hemiabdomen. There is no evidence of free intraperitoneal air. Numerous surgical clips in the low abdomen and left upper quadrant. No radiopaque calculi or other significant radiographic abnormality is seen. Heart size and mediastinal contours are within normal limits. Chronic scarring in the left mid lung. Insert clear IMPRESSION: 1. Similar appearance of mildly dilated colon, predominantly in the right hemiabdomen. 2. No acute cardiopulmonary disease. Electronically Signed   By: Davina Poke D.O.   On: 11/12/2021 13:58   ECHOCARDIOGRAM COMPLETE  Result  Date: 11/12/2021    ECHOCARDIOGRAM REPORT   Patient Name:   [name] Date of Exam: 11/12/2021 Medical Rec #:  [id number]             Height:       64.0 in Accession #:    [id number]            Weight:       188.0 lb Date of Birth:  [birth year]             BSA:          1.906 m Patient Age:    33 years              BP:           90/64 mmHg Patient Gender: F                     HR:           73 bpm. Exam Location:  [facility] Procedure: 2D Echo, Cardiac Doppler and Color Doppler Indications:    Syncope  History:        Patient has prior history of Echocardiogram examinations, most                 recent 09/17/2020. Signs/Symptoms:Chest Pain and Syncope; Risk                 Factors:Hypertension and Dyslipidemia.  Sonographer:    Wenda Low Referring Phys: [phone] Daksha Koone  Sonographer Comments: Suboptimal apical window and patient is morbidly obese. IMPRESSIONS  1. Left ventricular ejection fraction, by estimation, is 60 to 65%. The left ventricle has normal function. The left ventricle has no regional wall motion abnormalities. Left ventricular diastolic parameters were normal.  2. Right ventricular systolic function is normal. The right ventricular size is normal. There is normal pulmonary artery systolic pressure. The estimated right ventricular systolic pressure is 64.4 mmHg.  3. The mitral valve is grossly normal. Trivial mitral valve regurgitation.  4. The aortic valve is tricuspid. Aortic valve regurgitation is not visualized. Aortic valve mean gradient measures 3.0 mmHg. Comparison(s): No significant change from prior study. Prior images reviewed side by side. FINDINGS  Left Ventricle: Left ventricular ejection fraction, by estimation, is 60 to 65%. The left ventricle has normal function. The left ventricle has no regional wall motion abnormalities. The left ventricular internal cavity size was normal in size. There is  no left ventricular hypertrophy. Left ventricular diastolic  parameters were  normal. Right Ventricle: The right ventricular size is normal. No increase in right ventricular wall thickness. Right ventricular systolic function is normal. There is normal pulmonary artery systolic pressure. The tricuspid regurgitant velocity is 2.16 m/s, and  with an assumed right atrial pressure of 8 mmHg, the estimated right ventricular systolic pressure is 67.6 mmHg. Left Atrium: Left atrial size was normal in size. Right Atrium: Right atrial size was normal in size. Pericardium: There is no evidence of pericardial effusion. Presence of epicardial fat layer. Mitral Valve: The mitral valve is grossly normal. Trivial mitral valve regurgitation. MV peak gradient, 2.4 mmHg. The mean mitral valve gradient is 1.0 mmHg. Tricuspid Valve: The tricuspid valve is grossly normal. Tricuspid valve regurgitation is trivial. Aortic Valve: The aortic valve is tricuspid. There is mild aortic valve annular calcification. Aortic valve regurgitation is not visualized. Aortic valve mean gradient measures 3.0 mmHg. Aortic valve peak gradient measures 5.7 mmHg. Aortic valve area, by  VTI measures 2.74 cm. Pulmonic Valve: The pulmonic valve was grossly normal. Pulmonic valve regurgitation is trivial. Aorta: The aortic root is normal in size and structure. IAS/Shunts: No atrial level shunt detected by color flow Doppler.  LEFT VENTRICLE PLAX 2D LVIDd:         4.90 cm   Diastology LVIDs:         3.15 cm   LV e' medial:    7.18 cm/s LV PW:         0.90 cm   LV E/e' medial:  9.0 LV IVS:        1.00 cm   LV e' lateral:   8.38 cm/s LVOT diam:     2.00 cm   LV E/e' lateral: 7.7 LV SV:         55 LV SV Index:   29 LVOT Area:     3.14 cm  LEFT ATRIUM             Index        RIGHT ATRIUM          Index LA diam:        3.60 cm 1.89 cm/m   RA Area:     9.95 cm LA Vol (A2C):   30.2 ml 15.85 ml/m  RA Volume:   18.30 ml 9.60 ml/m LA Vol (A4C):   22.1 ml 11.60 ml/m LA Biplane Vol: 26.6 ml 13.96 ml/m  AORTIC VALVE                    PULMONIC  VALVE AV Area (Vmax):    2.21 cm     PV Vmax:       0.81 m/s AV Area (Vmean):   2.45 cm     PV Peak grad:  2.6 mmHg AV Area (VTI):     2.74 cm AV Vmax:           119.00 cm/s AV Vmean:          74.800 cm/s AV VTI:            0.202 m AV Peak Grad:      5.7 mmHg AV Mean Grad:      3.0 mmHg LVOT Vmax:         83.60 cm/s LVOT Vmean:        58.400 cm/s LVOT VTI:          0.176 m LVOT/AV VTI ratio: 0.87  AORTA Ao Root diam: 2.90 cm MITRAL VALVE  TRICUSPID VALVE MV Area (PHT): 4.08 cm    TR Peak grad:   18.7 mmHg MV Area VTI:   2.64 cm    TR Vmax:        216.00 cm/s MV Peak grad:  2.4 mmHg MV Mean grad:  1.0 mmHg    SHUNTS MV Vmax:       0.78 m/s    Systemic VTI:  0.18 m MV Vmean:      48.9 cm/s   Systemic Diam: 2.00 cm MV Decel Time: 186 msec MV E velocity: 64.70 cm/s MV A velocity: 58.70 cm/s MV E/A ratio:  1.10 Rozann Lesches MD Electronically signed by Rozann Lesches MD Signature Date/Time: 11/12/2021/12:33:54 PM    Final    DG HIP UNILAT WITH PELVIS 2-3 VIEWS RIGHT  Result Date: 11/12/2021 CLINICAL DATA:  Fall, right hip pain EXAM: DG HIP (WITH OR WITHOUT PELVIS) 2-3V RIGHT COMPARISON:  CT 11/11/2021 FINDINGS: Subtle linear lucency within the right inferior pubic ramus, indeterminate for fracture. Osseous structures are otherwise intact. Hip joint intact without dislocation. Right hip joint space is maintained. There is soft tissue swelling along the lateral aspect of the hip at the level of the greater trochanter. IMPRESSION: 1. Subtle linear lucency within the right inferior pubic ramus, indeterminate for fracture. No fracture was present at this location on CT performed 1 day ago. 2. Soft tissue swelling along the lateral aspect of the hip. Electronically Signed   By: Davina Poke D.O.   On: 11/12/2021 12:37   DG SMALL BOWEL W DOUBLE CM (HD)  Result Date: 11/14/2021 CLINICAL DATA:  Abdominal pain. History of colon resection. Vomiting. Rule out bowel obstruction. EXAM: SMALL BOWEL  SERIES COMPARISON:  CT abdomen pelvis 11/11/2021 TECHNIQUE: Following ingestion of thin barium, serial small bowel images were obtained FLUOROSCOPY TIME:  Fluoroscopy Time:  0 Radiation Exposure Index (if provided by the fluoroscopic device): Number of Acquired Spot Images: 0 FINDINGS: Preliminary KUB demonstrates dilated bowel loops in the right abdomen and the central abdomen. These appear to be dilated small bowel loops. There are numerous surgical staple lines and clips in the abdomen. Review of the CT reveals apparent colectomy in the proximal sigmoid region. Surgical clips in the gallbladder fossa. Patient ingested barium and serial images were obtained to 180 minutes. Additional imaging recommended as barium has not reached the rectosigmoid at this time. Stomach grossly normal. Stomach empties into the duodenum. A small diverticulum in the third portion the duodenum. The jejunum fills with contrast and is not significantly dilated. On the delayed image at 180 minutes, there is barium in the right upper quadrant which appears to be mildly dilated ileum. No bowel wall edema or mass. IMPRESSION: 1. History of colectomy at the level the proximal sigmoid colon. 2. Barium is slow to transit through the small bowel. Barium filled loops in the right upper quadrant likely dilated ileum related to small-bowel obstruction. This dictation includes images out to 180 minutes. Additional KUB has been requested in 3 hours. Electronically Signed   By: Franchot Gallo M.D.   On: 11/14/2021 16:09   CT Maxillofacial Wo Contrast  Result Date: 11/11/2021 CLINICAL DATA:  Right-sided facial swelling after fall this morning. EXAM: CT MAXILLOFACIAL WITHOUT CONTRAST TECHNIQUE: Multidetector CT imaging of the maxillofacial structures was performed. Multiplanar CT image reconstructions were also generated. COMPARISON:  CT head dated April 13, 2016. CT maxillofacial dated August 11, 2009. FINDINGS: Osseous: No fracture or mandibular  dislocation. No destructive process. Orbits: Negative. No traumatic  or inflammatory finding. Sinuses: Clear. Soft tissues: Mild right facial soft tissue swelling. Limited intracranial: No significant or unexpected finding. IMPRESSION: 1. No acute maxillofacial fracture. 2. Mild right facial soft tissue swelling. Electronically Signed   By: Titus Dubin M.D.   On: 11/11/2021 16:08    Orson Eva, DO  [facility]  If 7PM-7AM, please contact night-coverage [email or web address] Password [email or web address] 11/14/2021, 4:13 PM   LOS: 3 days

## 2021-11-14 NOTE — Progress Notes (Signed)
Subjective:  Complains of intermittent cramping and abdominal swelling with radiation of pain into her back. She had dry heaves last night. She took bites of grits for breakfast today. No BM today.  Objective: Vital signs in last 24 hours: Temp:  [98.6 F (37 C)-98.7 F (37.1 C)] 98.6 F (37 C) (11/18 0545) Pulse Rate:  [74-77] 74 (11/18 0545) Resp:  [16] 16 (11/18 0545) BP: (102-117)/(55-57) 102/55 (11/18 0545) SpO2:  [98 %] 98 % (11/18 0545) Last BM Date: 11/13/21 General:   Alert,  Well-developed, well-nourished, pleasant and cooperative in NAD Head:  Normocephalic and atraumatic. Eyes:  Sclera clear, no icterus.  Abdomen:  Soft, slight distention in midline and to right of midline with palpable bowel. Easily reducible but tender ventral hernia. Hyperactive bowel sounds with mild tympany in upper right abd. without guarding, and without rebound.   Extremities:  Without clubbing, deformity or edema. Neurologic:  Alert and  oriented x4;  grossly normal neurologically. Skin:  Intact without significant lesions or rashes. Psych:  Alert and cooperative. Normal mood and affect.  Intake/Output from previous day: 11/17 0701 - 11/18 0700 In: 2104.9 [P.O.:1200; I.V.:415.4; IV Piggyback:489.5] Out: -  Intake/Output this shift: Total I/O In: 240 [P.O.:240] Out: -   Lab Results: CBC Recent Labs    11/11/21 1305 11/12/21 0517 11/13/21 0611  WBC 9.4 5.8 5.6  HGB 16.4* 12.8 13.2  HCT 49.0* 38.5 40.7  MCV 90.4 91.4 92.5  PLT 243 191 172   BMET Recent Labs    11/11/21 1317 11/12/21 0517 11/13/21 0611  NA 134* 138 140  K 3.6 3.4* 3.2*  CL 104 111 112*  CO2 20* 21* 21*  GLUCOSE 107* 87 97  BUN 22* 18 10  CREATININE 0.82 0.70 0.57  CALCIUM 9.9 8.6* 8.8*   LFTs Recent Labs    11/11/21 1317 11/13/21 0611  BILITOT 1.2 0.5  ALKPHOS 98 77  AST 37 26  ALT 44 31  PROT 8.0 5.6*  ALBUMIN 4.6 3.2*   Recent Labs    11/11/21 1317  LIPASE 40   PT/INR No results for  input(s): LABPROT, INR in the last 72 hours.    Imaging Studies: CT ABDOMEN PELVIS W CONTRAST  Result Date: 11/11/2021 CLINICAL DATA:  Nausea, vomiting, dehydration. History of small-bowel obstruction and prior bowel resections. EXAM: CT ABDOMEN AND PELVIS WITH CONTRAST TECHNIQUE: Multidetector CT imaging of the abdomen and pelvis was performed using the standard protocol following bolus administration of intravenous contrast. CONTRAST:  155mL OMNIPAQUE IOHEXOL 300 MG/ML  SOLN COMPARISON:  06/12/2019 FINDINGS: Lower chest: Included lung bases are clear. Heart size is normal. Right breast prosthesis. Hepatobiliary: No focal liver abnormality is seen. Status post cholecystectomy. Similar degree of postoperative intra and extrahepatic biliary dilatation. Pancreas: Unremarkable. No pancreatic ductal dilatation or surrounding inflammatory changes. Spleen: Normal in size without focal abnormality. Adrenals/Urinary Tract: Unremarkable adrenal glands. Kidneys enhance symmetrically without focal lesion, stone, or hydronephrosis. Ureters are nondilated. Urinary bladder appears unremarkable for the degree of distension. Stomach/Bowel: Stomach within normal limits. Redemonstrated postoperative changes related to prior large and small bowel resections. No dilated loops of small bowel. Mildly dilated colon within the right abdomen with air-fluid levels. Mucosal hyperenhancement within the rectum without focal thickening. Vascular/Lymphatic: No significant vascular findings are present. No enlarged abdominal or pelvic lymph nodes. Reproductive: Status post hysterectomy. No adnexal masses. Other: Large broad-based ventral midline abdominal wall hernia with postsurgical changes along the abdominal wall. No ascites. No pneumoperitoneum. Musculoskeletal: No acute or significant  osseous findings. IMPRESSION: 1. Postsurgical changes to the bowel. Mildly dilated colon within the right abdomen with air-fluid levels suggestive of a  diarrheal illness. Partial or developing bowel obstruction is not entirely excluded, particularly given the degree of postsurgical changes along the anterior abdominal wall. Close clinical follow-up recommended. 2. Mucosal hyperenhancement within the rectum without focal thickening, which may represent proctitis. Electronically Signed   By: Davina Poke D.O.   On: 11/11/2021 16:12   DG ABD ACUTE 2+V W 1V CHEST  Result Date: 11/12/2021 CLINICAL DATA:  Abdominal pain, weakness, chest pain EXAM: DG ABDOMEN ACUTE WITH 1 VIEW CHEST COMPARISON:  CT 11/11/2021, chest x-ray 04/13/2016 FINDINGS: Similar appearance of mildly dilated colon, predominantly in the right hemiabdomen. There is no evidence of free intraperitoneal air. Numerous surgical clips in the low abdomen and left upper quadrant. No radiopaque calculi or other significant radiographic abnormality is seen. Heart size and mediastinal contours are within normal limits. Chronic scarring in the left mid lung. Insert clear IMPRESSION: 1. Similar appearance of mildly dilated colon, predominantly in the right hemiabdomen. 2. No acute cardiopulmonary disease. Electronically Signed   By: Davina Poke D.O.   On: 11/12/2021 13:58   ECHOCARDIOGRAM COMPLETE  Result Date: 11/12/2021    ECHOCARDIOGRAM REPORT   Patient Name:   [name] Date of Exam: 11/12/2021 Medical Rec #:  [id number]             Height:       64.0 in Accession #:    [id number]            Weight:       188.0 lb Date of Birth:  Jun 23, 1961             BSA:          1.906 m Patient Age:    60 years              BP:           90/64 mmHg Patient Gender: F                     HR:           73 bpm. Exam Location:  [facility] Procedure: 2D Echo, Cardiac Doppler and Color Doppler Indications:    Syncope  History:        Patient has prior history of Echocardiogram examinations, most                 recent 09/17/2020. Signs/Symptoms:Chest Pain and Syncope; Risk                  Factors:Hypertension and Dyslipidemia.  Sonographer:    Wenda Low Referring Phys: [phone] DAVID TAT  Sonographer Comments: Suboptimal apical window and patient is morbidly obese. IMPRESSIONS  1. Left ventricular ejection fraction, by estimation, is 60 to 65%. The left ventricle has normal function. The left ventricle has no regional wall motion abnormalities. Left ventricular diastolic parameters were normal.  2. Right ventricular systolic function is normal. The right ventricular size is normal. There is normal pulmonary artery systolic pressure. The estimated right ventricular systolic pressure is 61.6 mmHg.  3. The mitral valve is grossly normal. Trivial mitral valve regurgitation.  4. The aortic valve is tricuspid. Aortic valve regurgitation is not visualized. Aortic valve mean gradient measures 3.0 mmHg. Comparison(s): No significant change from prior study. Prior images reviewed side by side. FINDINGS  Left Ventricle: Left ventricular ejection fraction, by estimation,  is 60 to 65%. The left ventricle has normal function. The left ventricle has no regional wall motion abnormalities. The left ventricular internal cavity size was normal in size. There is  no left ventricular hypertrophy. Left ventricular diastolic parameters were normal. Right Ventricle: The right ventricular size is normal. No increase in right ventricular wall thickness. Right ventricular systolic function is normal. There is normal pulmonary artery systolic pressure. The tricuspid regurgitant velocity is 2.16 m/s, and  with an assumed right atrial pressure of 8 mmHg, the estimated right ventricular systolic pressure is 70.6 mmHg. Left Atrium: Left atrial size was normal in size. Right Atrium: Right atrial size was normal in size. Pericardium: There is no evidence of pericardial effusion. Presence of epicardial fat layer. Mitral Valve: The mitral valve is grossly normal. Trivial mitral valve regurgitation. MV peak gradient, 2.4 mmHg. The  mean mitral valve gradient is 1.0 mmHg. Tricuspid Valve: The tricuspid valve is grossly normal. Tricuspid valve regurgitation is trivial. Aortic Valve: The aortic valve is tricuspid. There is mild aortic valve annular calcification. Aortic valve regurgitation is not visualized. Aortic valve mean gradient measures 3.0 mmHg. Aortic valve peak gradient measures 5.7 mmHg. Aortic valve area, by  VTI measures 2.74 cm. Pulmonic Valve: The pulmonic valve was grossly normal. Pulmonic valve regurgitation is trivial. Aorta: The aortic root is normal in size and structure. IAS/Shunts: No atrial level shunt detected by color flow Doppler.  LEFT VENTRICLE PLAX 2D LVIDd:         4.90 cm   Diastology LVIDs:         3.15 cm   LV e' medial:    7.18 cm/s LV PW:         0.90 cm   LV E/e' medial:  9.0 LV IVS:        1.00 cm   LV e' lateral:   8.38 cm/s LVOT diam:     2.00 cm   LV E/e' lateral: 7.7 LV SV:         55 LV SV Index:   29 LVOT Area:     3.14 cm  LEFT ATRIUM             Index        RIGHT ATRIUM          Index LA diam:        3.60 cm 1.89 cm/m   RA Area:     9.95 cm LA Vol (A2C):   30.2 ml 15.85 ml/m  RA Volume:   18.30 ml 9.60 ml/m LA Vol (A4C):   22.1 ml 11.60 ml/m LA Biplane Vol: 26.6 ml 13.96 ml/m  AORTIC VALVE                    PULMONIC VALVE AV Area (Vmax):    2.21 cm     PV Vmax:       0.81 m/s AV Area (Vmean):   2.45 cm     PV Peak grad:  2.6 mmHg AV Area (VTI):     2.74 cm AV Vmax:           119.00 cm/s AV Vmean:          74.800 cm/s AV VTI:            0.202 m AV Peak Grad:      5.7 mmHg AV Mean Grad:      3.0 mmHg LVOT Vmax:         83.60 cm/s LVOT  Vmean:        58.400 cm/s LVOT VTI:          0.176 m LVOT/AV VTI ratio: 0.87  AORTA Ao Root diam: 2.90 cm MITRAL VALVE               TRICUSPID VALVE MV Area (PHT): 4.08 cm    TR Peak grad:   18.7 mmHg MV Area VTI:   2.64 cm    TR Vmax:        216.00 cm/s MV Peak grad:  2.4 mmHg MV Mean grad:  1.0 mmHg    SHUNTS MV Vmax:       0.78 m/s    Systemic VTI:  0.18 m  MV Vmean:      48.9 cm/s   Systemic Diam: 2.00 cm MV Decel Time: 186 msec MV E velocity: 64.70 cm/s MV A velocity: 58.70 cm/s MV E/A ratio:  1.10 Rozann Lesches MD Electronically signed by Rozann Lesches MD Signature Date/Time: 11/12/2021/12:33:54 PM    Final    DG HIP UNILAT WITH PELVIS 2-3 VIEWS RIGHT  Result Date: 11/12/2021 CLINICAL DATA:  Fall, right hip pain EXAM: DG HIP (WITH OR WITHOUT PELVIS) 2-3V RIGHT COMPARISON:  CT 11/11/2021 FINDINGS: Subtle linear lucency within the right inferior pubic ramus, indeterminate for fracture. Osseous structures are otherwise intact. Hip joint intact without dislocation. Right hip joint space is maintained. There is soft tissue swelling along the lateral aspect of the hip at the level of the greater trochanter. IMPRESSION: 1. Subtle linear lucency within the right inferior pubic ramus, indeterminate for fracture. No fracture was present at this location on CT performed 1 day ago. 2. Soft tissue swelling along the lateral aspect of the hip. Electronically Signed   By: Davina Poke D.O.   On: 11/12/2021 12:37   CT Maxillofacial Wo Contrast  Result Date: 11/11/2021 CLINICAL DATA:  Right-sided facial swelling after fall this morning. EXAM: CT MAXILLOFACIAL WITHOUT CONTRAST TECHNIQUE: Multidetector CT imaging of the maxillofacial structures was performed. Multiplanar CT image reconstructions were also generated. COMPARISON:  CT head dated April 13, 2016. CT maxillofacial dated August 11, 2009. FINDINGS: Osseous: No fracture or mandibular dislocation. No destructive process. Orbits: Negative. No traumatic or inflammatory finding. Sinuses: Clear. Soft tissues: Mild right facial soft tissue swelling. Limited intracranial: No significant or unexpected finding. IMPRESSION: 1. No acute maxillofacial fracture. 2. Mild right facial soft tissue swelling. Electronically Signed   By: Titus Dubin M.D.   On: 11/11/2021 16:08  [2 weeks]   Assessment:  [name] is  a 60 year old female with past medical hx significant for Paget's disease breast cancer s/p mastectomy, PE in 1997 on Xarelto and IVC filter placement s/p partial removal, anxiety, PSVT s/p ablation, prior CVA, fibromylagia, complicated hx of partial colectomy due to redundant colon w/ primary anastomosis, perforated bowel due to gynelogic procedure complicated by enterocutaneous fistula that required multiple surgeries and multiple SBOs. Patient presented to GI office day of admission for ongoing bilious vomiting, and diarrhea, s/p partial SBO with hospitalization on 10/25/21. She was referred to ER for further evaluation of her ongoing vomiting and diarrhea, as well as acute syncopal episode that occurred yesterday morning.  Nausea/vomiting/diarrhea/abdominal pain: Recent admission October 29 at [facility] for partial small bowel obstruction, CT during admission showed multiple mildly dilated loops of small bowel in the right lower quadrant, gas and stool visualized in the remaining large bowel, low-grade SBO cannot be excluded.  Patient was treated with bowel rest.  Symptoms returned over the weekend, developing bilious vomiting and diarrhea with generalized abdominal pain.  Syncopal episode at home felt to be secondary to dehydration.  BMs and flatus had stopped around 7 AM the day prior to admission. Current CT with mildly dilated colon within the right abdomen with air-fluid levels suggestive of diarrheal illness, partial or developing small bowel obstruction cannot be excluded, findings concerning for proctitis.  C. difficile testing negative.  GI pathogen panel negative. There is concern for possible small bowel stricture due to adhesions unless dilated small bowel is chronic for her.  Given negative stool studies, SBFT planned for today. Today she continues to have gripy abdominal pain and had dry heaves last night. She has some abdominal distention with palpable loops of bowel in the ventral  region and to the right.    Last colonoscopy May 2022, prep was fair.  Ileum normal, patent side-to-side ileocolonic anastomosis, healthy appearing mucosa, torsioned anastomosis, nonbleeding internal hemorrhoids.   Intra/extrahepatic biliary ductal dilation: Chronic finding, patient is status postcholecystectomy, LFTs are normal.  Lipase normal. This could be secondary to postcholecystectomy and/or chronic opioid use   Dysuria: U/A this morning normal. Culture pending. Started on rocephin this morning. Further management per attending.     Plan: F/U SBFT as available.   Laureen Ochs. Bernarda Caffey [facility] [phone] 11/18/202212:47 PM    LOS: 3 days

## 2021-11-14 NOTE — Care Management Important Message (Signed)
Important Message  Patient Details  Name: [name] MRN: [id number] Date of Birth: 16-Nov-1961   Medicare Important Message Given:  Yes     Tommy Medal 11/14/2021, 2:55 PM

## 2021-11-14 NOTE — Progress Notes (Signed)
Discussed with pt and MD pt's concerns for more pain medication. MD is aware of what pt takes at home and not agreeable for anything more. Will continue with medication as ordered.

## 2021-11-14 NOTE — Plan of Care (Signed)
  Problem: Activity: Goal: Risk for activity intolerance will decrease Outcome: Completed/Met   Problem: Nutrition: Goal: Adequate nutrition will be maintained Outcome: Completed/Met   

## 2021-11-15 ENCOUNTER — Inpatient Hospital Stay (HOSPITAL_COMMUNITY): Payer: Medicare Other

## 2021-11-15 DIAGNOSIS — I10 Essential (primary) hypertension: Secondary | ICD-10-CM

## 2021-11-15 DIAGNOSIS — K9189 Other postprocedural complications and disorders of digestive system: Secondary | ICD-10-CM

## 2021-11-15 DIAGNOSIS — G894 Chronic pain syndrome: Secondary | ICD-10-CM

## 2021-11-15 DIAGNOSIS — Z86718 Personal history of other venous thrombosis and embolism: Secondary | ICD-10-CM

## 2021-11-15 DIAGNOSIS — R197 Diarrhea, unspecified: Secondary | ICD-10-CM

## 2021-11-15 DIAGNOSIS — R1033 Periumbilical pain: Secondary | ICD-10-CM

## 2021-11-15 DIAGNOSIS — R933 Abnormal findings on diagnostic imaging of other parts of digestive tract: Secondary | ICD-10-CM

## 2021-11-15 LAB — URINE CULTURE: Culture: 60000 — AB

## 2021-11-15 LAB — GLUCOSE, CAPILLARY
Glucose-Capillary: 109 mg/dL — ABNORMAL HIGH (ref 70–99)
Glucose-Capillary: 113 mg/dL — ABNORMAL HIGH (ref 70–99)
Glucose-Capillary: 81 mg/dL (ref 70–99)
Glucose-Capillary: 84 mg/dL (ref 70–99)
Glucose-Capillary: 99 mg/dL (ref 70–99)

## 2021-11-15 LAB — BASIC METABOLIC PANEL
Anion gap: 7 (ref 5–15)
BUN: 7 mg/dL (ref 6–20)
CO2: 25 mmol/L (ref 22–32)
Calcium: 8.8 mg/dL — ABNORMAL LOW (ref 8.9–10.3)
Chloride: 109 mmol/L (ref 98–111)
Creatinine, Ser: 0.63 mg/dL (ref 0.44–1.00)
GFR, Estimated: >60 mL/min (ref >60)
Glucose, Bld: 90 mg/dL (ref 70–99)
Potassium: 3.3 mmol/L — ABNORMAL LOW (ref 3.5–5.1)
Sodium: 141 mmol/L (ref 135–145)

## 2021-11-15 LAB — CBC
HCT: 39.7 % (ref 36.0–46.0)
Hemoglobin: 13.4 g/dL (ref 12.0–15.0)
MCH: 30.6 pg (ref 26.0–34.0)
MCHC: 33.8 g/dL (ref 30.0–36.0)
MCV: 90.6 fL (ref 80.0–100.0)
Platelets: 186 10*3/uL (ref 150–400)
RBC: 4.38 MIL/uL (ref 3.87–5.11)
RDW: 13.5 % (ref 11.5–15.5)
WBC: 6.7 10*3/uL (ref 4.0–10.5)
nRBC: 0 % (ref 0.0–0.2)

## 2021-11-15 LAB — MAGNESIUM: Magnesium: 1.5 mg/dL — ABNORMAL LOW (ref 1.7–2.4)

## 2021-11-15 MED ORDER — HYDROMORPHONE HCL 1 MG/ML IJ SOLN
1.0000 mg | INTRAMUSCULAR | Status: DC | PRN
  Administered 2021-11-15 – 2021-11-18 (×14): 1 mg via INTRAVENOUS
  Filled 2021-11-15 (×15): qty 1

## 2021-11-15 MED ORDER — POTASSIUM CHLORIDE CRYS ER 20 MEQ PO TBCR
40.0000 meq | EXTENDED_RELEASE_TABLET | Freq: Once | ORAL | Status: AC
  Administered 2021-11-15: 40 meq via ORAL
  Filled 2021-11-15: qty 2

## 2021-11-15 MED ORDER — KETOROLAC TROMETHAMINE 30 MG/ML IJ SOLN
30.0000 mg | Freq: Two times a day (BID) | INTRAMUSCULAR | Status: AC
  Administered 2021-11-15 (×2): 30 mg via INTRAVENOUS
  Filled 2021-11-15 (×2): qty 1

## 2021-11-15 MED ORDER — MAGNESIUM SULFATE 4 GM/100ML IV SOLN
4.0000 g | Freq: Once | INTRAVENOUS | Status: AC
  Administered 2021-11-15: 4 g via INTRAVENOUS
  Filled 2021-11-15: qty 100

## 2021-11-15 MED ORDER — METHYLPREDNISOLONE SODIUM SUCC 40 MG IJ SOLR
40.0000 mg | Freq: Two times a day (BID) | INTRAMUSCULAR | Status: DC
Start: 2021-11-15 — End: 2021-11-16
  Administered 2021-11-15 – 2021-11-16 (×2): 40 mg via INTRAVENOUS
  Filled 2021-11-15 (×2): qty 1

## 2021-11-15 NOTE — Progress Notes (Signed)
Subjective:  Patient says she is passing barium per rectum.  She continues to complain of abdominal pain which she says pain starts in her back and radiates to her abdomen and also goes into her legs.  She feels nauseated.  She has not had vomiting.  Current Medications:  Current Facility-Administered Medications:    0.9 %  sodium chloride infusion, , Intravenous, Continuous, Tat, David, MD, Last Rate: 50 mL/hr at 11/14/21 1743, New Bag at 11/14/21 1743   acetaminophen (TYLENOL) tablet 650 mg, 650 mg, Oral, Q6H PRN, 650 mg at 11/14/21 0208 **OR** acetaminophen (TYLENOL) suppository 650 mg, 650 mg, Rectal, Q6H PRN, Emokpae, Ejiroghene E, MD   ALPRAZolam (XANAX) tablet 1 mg, 1 mg, Oral, BID, Emokpae, Ejiroghene E, MD, 1 mg at 11/15/21 1914   aspirin chewable tablet 81 mg, 81 mg, Oral, Daily, Emokpae, Ejiroghene E, MD, 81 mg at 11/15/21 0934   conjugated estrogens (PREMARIN) vaginal cream 1 Applicatorful, 1 Applicatorful, Vaginal, Daily, Tat, David, MD   cycloSPORINE (RESTASIS) 0.05 % ophthalmic emulsion 1 drop, 1 drop, Both Eyes, BID PRN, Emokpae, Ejiroghene E, MD   DULoxetine (CYMBALTA) DR capsule 60 mg, 60 mg, Oral, Daily, Emokpae, Ejiroghene E, MD, 60 mg at 11/15/21 0934   feeding supplement (BOOST / RESOURCE BREEZE) liquid 1 Container, 1 Container, Oral, TID BM, Orson Eva, MD, 1 Container at 11/13/21 0834   HYDROmorphone (DILAUDID) injection 1 mg, 1 mg, Intravenous, Q4H PRN, Kathie Dike, MD, 1 mg at 11/15/21 1148   insulin aspart (novoLOG) injection 0-9 Units, 0-9 Units, Subcutaneous, Q6H, Emokpae, Ejiroghene E, MD   levothyroxine (SYNTHROID) tablet 125 mcg, 125 mcg, Oral, Q0600, Emokpae, Ejiroghene E, MD, 125 mcg at 11/15/21 0700   magnesium sulfate IVPB 4 g 100 mL, 4 g, Intravenous, Once, Kathie Dike, MD   metoprolol tartrate (LOPRESSOR) tablet 12.5 mg, 12.5 mg, Oral, BID, Tat, David, MD, 12.5 mg at 11/14/21 0849   ondansetron (ZOFRAN) tablet 4 mg, 4 mg, Oral, Q6H PRN **OR**  ondansetron (ZOFRAN) injection 4 mg, 4 mg, Intravenous, Q6H PRN, Emokpae, Ejiroghene E, MD, 4 mg at 11/14/21 1206   pantoprazole (PROTONIX) injection 40 mg, 40 mg, Intravenous, Q12H, Tat, Shanon Brow, MD, 40 mg at 11/15/21 7829   phenazopyridine (PYRIDIUM) tablet 200 mg, 200 mg, Oral, TID WC, Tat, David, MD, 200 mg at 11/15/21 1157   polyethylene glycol (MIRALAX / GLYCOLAX) packet 17 g, 17 g, Oral, Daily PRN, Emokpae, Ejiroghene E, MD   potassium chloride SA (KLOR-CON) CR tablet 40 mEq, 40 mEq, Oral, Once, Memon, Jolaine Artist, MD   rivaroxaban (XARELTO) tablet 20 mg, 20 mg, Oral, Q supper, Emokpae, Ejiroghene E, MD, 20 mg at 11/14/21 1748   senna-docusate (Senokot-S) tablet 2 tablet, 2 tablet, Oral, BID, Emokpae, Ejiroghene E, MD, 2 tablet at 11/15/21 0934   sucralfate (CARAFATE) 1 GM/10ML suspension 1 g, 1 g, Oral, TID WC & HS, Hawley Michel U, MD, 1 g at 11/15/21 1157   Objective: Blood pressure (!) 88/61, pulse 77, temperature 98.2 F (36.8 C), resp. rate 17, height _0  (1.626 m), weight 85.3 kg, SpO2 91 %. Patient is alert in no acute distress but appears to be worried. Abdominal exam reveals large ventral hernia in upper mid abdomen.  Bowel sounds are normal she has generalized tenderness.  Labs/studies Results:   CBC Latest Ref Rng & Units 11/15/2021 11/13/2021 11/12/2021  WBC 4.0 - 10.5 K/uL 6.7 5.6 5.8  Hemoglobin 12.0 - 15.0 g/dL 13.4 13.2 12.8  Hematocrit 36.0 - 46.0 % 39.7 40.7 38.5  Platelets 150 - 400 K/uL 186 172 191    CMP Latest Ref Rng & Units 11/15/2021 11/13/2021 11/12/2021  Glucose 70 - 99 mg/dL 90 97 87  BUN 6 - 20 mg/dL _0 Creatinine 0.44 - 1.00 mg/dL 0.63 0.57 0.70  Sodium 135 - 145 mmol/L 141 140 138  Potassium 3.5 - 5.1 mmol/L 3.3(L) 3.2(L) 3.4(L)  Chloride 98 - 111 mmol/L 109 112(H) 111  CO2 22 - 32 mmol/L 25 21(L) 21(L)  Calcium 8.9 - 10.3 mg/dL 8.8(L) 8.8(L) 8.6(L)  Total Protein 6.5 - 8.1 g/dL - 5.6(L) -  Total Bilirubin 0.3 - 1.2 mg/dL - 0.5 -   Alkaline Phos 38 - 126 U/L - 77 -  AST 15 - 41 U/L - 26 -  ALT 0 - 44 U/L - 31 -    Hepatic Function Latest Ref Rng & Units 11/13/2021 11/11/2021 09/11/2021  Total Protein 6.5 - 8.1 g/dL 5.6(L) 8.0 6.9  Albumin 3.5 - 5.0 g/dL 3.2(L) 4.6 4.2  AST 15 - 41 U/L 26 37 23  ALT 0 - 44 U/L 31 44 23  Alk Phosphatase 38 - 126 U/L 77 98 88  Total Bilirubin 0.3 - 1.2 mg/dL 0.5 1.2 0.5  Bilirubin, Direct 0.0 - 0.3 mg/dL - - -    Small bowel study from yesterday and KUB from this morning reviewed. Small bowel study revealed very slow transit of contrast through small bowel.  No stricture identified. KUB from this morning reveals contrast in small bowel as well as rectum.   Assessment:  #1.  Abdominal pain nausea and vomiting mostly secondary to GI pseudoobstruction but she could have a stricture not identified on small bowel follow-through.  She would benefit from prucalopride but it is not on her formulary.  Prucalopride is FDA approved for constipation but it does help with small bowel motility.  #2.  Back pain radiating to her abdomen and legs.  This appears to be a separate issue.  Plan:  Continue polyethylene glycol. Begin prucalopride 1 mg by mouth every morning when she is discharged.

## 2021-11-15 NOTE — Progress Notes (Signed)
Pt requesting to see Night Doctor, stated "I am concerned that the pain I'm having is from something else." Notified Physician and also made doctor aware for pt's request for higher pain medication.

## 2021-11-15 NOTE — Progress Notes (Signed)
PROGRESS NOTE  [name] OVZ:[id number] DOB: [birth year] DOA: 11/11/2021 PCP: Fayrene Helper, MD  Brief History:  60 year old female with a history of fibromyalgia, ovarian cancer, left breast cancer, multiple abdominal surgeries, IVC filter March 2017, pulmonary embolus, anxiety, chronic pain syndrome, fractured IVC, hypertension, hyperlipidemia presenting with nausea, vomiting, diarrhea, and abdominal pain that began on 11/09/2021.  The patient recently was discharged from [facility] after a stay from 10/25/2021 to 10/29/2021 when she was treated for presumptive partial small bowel obstruction.  The patient was treated conservatively and improved with medical therapy.  A CT of the abdomen and pelvis during that admission showed multiple dilated small bowel loops in the right lower quadrant, but also showed stool and gas in the colon.  The patient stated that she was feeling fine when she went home until her symptoms began on 11/09/2021.  The patient states that she eats a lot of salads.  She began having worsening abdominal pain and diarrhea and vomiting.  There is no hematochezia or melena.  The patient went to see her GI physician on 11/11/2021, and she was directed to the emergency department for further evaluation.  The patient denies any fever, chills, headache, neck pain, shortness breath, cough, hemoptysis. In addition, the patient states that she has been feeling dizzy for the past 2 days.  The patient was standing up folding close when she had a syncopal episode on 11/11/2021.  This was preceded by worsening dizziness.   Assessment/Plan: Intractable nausea/vomiting/abdominal pain/Diarrhea -Doubt small bowel obstruction as the patient is having bowel movements and passing flatus -11/17--had one large BM -11/18--had BM -11/15 CT abd--mildly dilated colon in the right abdomen with air-fluid levels suggesting diarrheal illness.  Mucosal enhancement within the rectum.  No  dilated small bowel loops. -GI consult appreciated>>concerned about SB stricture -11/18--planning SB follow through study -11/18--SBFT>>Barium is slow to transit through the small bowel. Barium filled loops in the right upper quadrant likely dilated ileum related to small-bowel obstruction. No obvious stricture on this imaging -Stool pathogen panel--neg -Stool for C. Difficile--negative -Clear liquid diet for now>>full liquids>>heart healthy diet on 11/18 -PPI twice daily -carafate started -continue IV dilaudid as needed for pain -suspect she has narcotic bowel syndrome -GI recommends that patient take prucalopride after discharge   Syncope -Remain on telemetry--no concerning dysrhythmia -Echocardiogram--EF 60-65%, no WMA, trivial MR -Secondary to volume depletion and vasovagal response -Continue IV fluids   Chronic pain syndrome -PDMP reviewed -She is chronically on hydromorphone 4 mg, last filled 10/13/2021, #90 -she is followed in Dr. Freddie Apley office   Anxiety -Continue home dose alprazolam 1 mg -Last refill 10/13/2021, #60 -Continue Cymbalta   DVT/PE -Continue rivaroxaban   Hypothyroidism -Continue Synthroid    Diabetes mellitus type 2 -NovoLog sliding scale>>discontinue -She takes Ozempic every Monday and metformin for weight loss -A1C--5.1   Hypertension -Continue metoprolol at lower dose   Dysuria -UA not suggestive UTI -d/c ceftriaxone -pyridium -restarted premarin cream   Hypokalemia -replete -check mag   Right Hip Pain -overall improved; able to ambulate without difficulty -11/16 xray hip--Subtle linear lucency within the right inferior pubic ramus, indeterminate for fracture -11/15 CT abd/pelvis--no pelvic fracture -patient is able to ambulate without significant pain in hip, so doubt any real pelvic pathology   Right sided low back pain -patient is concerned that she may have sciatic pain which is worse after fall prior to admission -will  check lumbar xray -positive straight leg raise  on the right -reports that toradol has helped in the past -will give a dose of toradol and start on course of steroids  Obesity -BMI 32.27       Status is: Inpatient   The patient will require care spanning > 2 midnights and should be moved to inpatient because: severity of illness requiring IVF, GI consult and work up and intolerance to diet             Family Communication:  no Family at bedside   Consultants:  GI   Code Status:  FULL    DVT Prophylaxis:  Xarelto     Procedures: As Listed in Progress Note Above   Antibiotics: None     Subjective: She complains of pain in her lower back on the right side that radiates down her right leg. No numbness in her LE. No paraesthesias. She is able to ambulate. Said pain in her back kept her up most of the night. This is an intermittent chronic issue, but worse after fall prior to admission  Objective: Vitals:   11/14/21 2123 11/15/21 0521 11/15/21 0934 11/15/21 1214  BP: (!) 117/58 (!) 106/58 108/66 (!) 88/61  Pulse: 78 73 80 77  Resp: 18 18  17   Temp: 98.6 F (37 C) 98.6 F (37 C)  98.2 F (36.8 C)  TempSrc: Oral     SpO2: 94% 94%  91%  Weight:      Height:        Intake/Output Summary (Last 24 hours) at 11/15/2021 1304 Last data filed at 11/15/2021 0900 Gross per 24 hour  Intake 961.66 ml  Output --  Net 961.66 ml   Weight change:  Exam:  General exam: Alert, awake, oriented x 3 Respiratory system: Clear to auscultation. Respiratory effort normal. Cardiovascular system:RRR. No murmurs, rubs, gallops. Gastrointestinal system: Abdomen is nondistended, soft and nontender. No organomegaly or masses felt. Normal bowel sounds heard. Central nervous system: Alert and oriented. No focal neurological deficits. +straight leg test on the right Extremities: No C/C/E, +pedal pulses Skin: No rashes, lesions or ulcers Psychiatry: Judgement and insight appear normal.  Mood & affect appropriate.     Data Reviewed: I have personally reviewed following labs and imaging studies Basic Metabolic Panel: Recent Labs  Lab 11/11/21 1305 11/11/21 1307 11/11/21 1317 11/12/21 0517 11/13/21 0611 11/15/21 0525  NA 136  --  134* 138 140 141  K 3.9  --  3.6 3.4* 3.2* 3.3*  CL 106  --  104 111 112* 109  CO2 19*  --  20* 21* 21* 25  GLUCOSE 106*  --  107* 87 97 90  BUN 21*  --  22* 18 10 7   CREATININE 0.93  --  0.82 0.70 0.57 0.63  CALCIUM 10.0  --  9.9 8.6* 8.8* 8.8*  MG  --  1.7  --   --  1.7 1.5*   Liver Function Tests: Recent Labs  Lab 11/11/21 1317 11/13/21 0611  AST 37 26  ALT 44 31  ALKPHOS 98 77  BILITOT 1.2 0.5  PROT 8.0 5.6*  ALBUMIN 4.6 3.2*   Recent Labs  Lab 11/11/21 1317  LIPASE 40   No results for input(s): AMMONIA in the last 168 hours. Coagulation Profile: No results for input(s): INR, PROTIME in the last 168 hours. CBC: Recent Labs  Lab 11/11/21 1305 11/12/21 0517 11/13/21 0611 11/15/21 0525  WBC 9.4 5.8 5.6 6.7  HGB 16.4* 12.8 13.2 13.4  HCT 49.0* 38.5 40.7 39.7  MCV 90.4 91.4 92.5 90.6  PLT 243 191 172 186   Cardiac Enzymes: No results for input(s): CKTOTAL, CKMB, CKMBINDEX, TROPONINI in the last 168 hours. BNP: Invalid input(s): POCBNP CBG: Recent Labs  Lab 11/14/21 1132 11/14/21 1647 11/15/21 0006 11/15/21 0524 11/15/21 1114  GLUCAP 89 85 109* 81 84   HbA1C: No results for input(s): HGBA1C in the last 72 hours.  Urine analysis:    Component Value Date/Time   COLORURINE YELLOW 11/13/2021 0630   APPEARANCEUR CLEAR 11/13/2021 0630   LABSPEC 1.009 11/13/2021 0630   PHURINE 6.0 11/13/2021 0630   GLUCOSEU NEGATIVE 11/13/2021 0630   GLUCOSEU NEG mg/dL 09/05/2007 2337   HGBUR NEGATIVE 11/13/2021 0630   HGBUR negative 03/13/2011 0957   BILIRUBINUR NEGATIVE 11/13/2021 0630   BILIRUBINUR small (A) 07/18/2021 1411   BILIRUBINUR neg 06/23/2018 1434   KETONESUR NEGATIVE 11/13/2021 0630   PROTEINUR  NEGATIVE 11/13/2021 0630   UROBILINOGEN 4.0 (A) 07/18/2021 1411   UROBILINOGEN 0.2 07/03/2015 2014   NITRITE NEGATIVE 11/13/2021 0630   LEUKOCYTESUR NEGATIVE 11/13/2021 0630   Sepsis Labs: @LABRCNTIP (procalcitonin:4,lacticidven:4) ) Recent Results (from the past 240 hour(s))  Resp Panel by RT-PCR (Flu A&B, Covid) Nasopharyngeal Swab     Status: None   Collection Time: 11/11/21  4:52 PM   Specimen: Nasopharyngeal Swab; Nasopharyngeal(NP) swabs in vial transport medium  Result Value Ref Range Status   SARS Coronavirus 2 by RT PCR NEGATIVE NEGATIVE Final    Comment: (NOTE) SARS-CoV-2 target nucleic acids are NOT DETECTED.  The SARS-CoV-2 RNA is generally detectable in upper respiratory specimens during the acute phase of infection. The lowest concentration of SARS-CoV-2 viral copies this assay can detect is 138 copies/mL. A negative result does not preclude SARS-Cov-2 infection and should not be used as the sole basis for treatment or other patient management decisions. A negative result may occur with  improper specimen collection/handling, submission of specimen other than nasopharyngeal swab, presence of viral mutation(s) within the areas targeted by this assay, and inadequate number of viral copies(<138 copies/mL). A negative result must be combined with clinical observations, patient history, and epidemiological information. The expected result is Negative.  Fact Sheet for Patients:  EntrepreneurPulse.com.au  Fact Sheet for Healthcare Providers:  IncredibleEmployment.be  This test is no t yet approved or cleared by the [place] FDA and  has been authorized for detection and/or diagnosis of SARS-CoV-2 by FDA under an Emergency Use Authorization (EUA). This EUA will remain  in effect (meaning this test can be used) for the duration of the COVID-19 declaration under Section 564(b)(1) of the Act, 21 U.S.C.section 360bbb-3(b)(1), unless  the authorization is terminated  or revoked sooner.       Influenza A by PCR NEGATIVE NEGATIVE Final   Influenza B by PCR NEGATIVE NEGATIVE Final    Comment: (NOTE) The Xpert Xpress SARS-CoV-2/FLU/RSV plus assay is intended as an aid in the diagnosis of influenza from Nasopharyngeal swab specimens and should not be used as a sole basis for treatment. Nasal washings and aspirates are unacceptable for Xpert Xpress SARS-CoV-2/FLU/RSV testing.  Fact Sheet for Patients: EntrepreneurPulse.com.au  Fact Sheet for Healthcare Providers: IncredibleEmployment.be  This test is not yet approved or cleared by the [place] FDA and has been authorized for detection and/or diagnosis of SARS-CoV-2 by FDA under an Emergency Use Authorization (EUA). This EUA will remain in effect (meaning this test can be used) for the duration of the COVID-19 declaration under Section 564(b)(1) of the Act, 21 U.S.C. section 360bbb-3(b)(1), unless  the authorization is terminated or revoked.  Performed at [facility], [address], [place], [place] [address]   Gastrointestinal Panel by PCR , Stool     Status: None   Collection Time: 11/11/21  7:20 PM   Specimen: Stool  Result Value Ref Range Status   Campylobacter species NOT DETECTED NOT DETECTED Final   Plesimonas shigelloides NOT DETECTED NOT DETECTED Final   Salmonella species NOT DETECTED NOT DETECTED Final   Yersinia enterocolitica NOT DETECTED NOT DETECTED Final   Vibrio species NOT DETECTED NOT DETECTED Final   Vibrio cholerae NOT DETECTED NOT DETECTED Final   Enteroaggregative E coli (EAEC) NOT DETECTED NOT DETECTED Final   Enteropathogenic E coli (EPEC) NOT DETECTED NOT DETECTED Final   Enterotoxigenic E coli (ETEC) NOT DETECTED NOT DETECTED Final   Shiga like toxin producing E coli (STEC) NOT DETECTED NOT DETECTED Final   Shigella/Enteroinvasive E coli (EIEC) NOT DETECTED NOT DETECTED Final   Cryptosporidium  NOT DETECTED NOT DETECTED Final   Cyclospora cayetanensis NOT DETECTED NOT DETECTED Final   Entamoeba histolytica NOT DETECTED NOT DETECTED Final   Giardia lamblia NOT DETECTED NOT DETECTED Final   Adenovirus F40/41 NOT DETECTED NOT DETECTED Final   Astrovirus NOT DETECTED NOT DETECTED Final   Norovirus GI/GII NOT DETECTED NOT DETECTED Final   Rotavirus A NOT DETECTED NOT DETECTED Final   Sapovirus (I, II, IV, and V) NOT DETECTED NOT DETECTED Final    Comment: Performed at [facility], [address] [place], [place] [address]  C Difficile Quick Screen w PCR reflex     Status: None   Collection Time: 11/11/21  7:20 PM   Specimen: Stool  Result Value Ref Range Status   C Diff antigen NEGATIVE NEGATIVE Final   C Diff toxin NEGATIVE NEGATIVE Final   C Diff interpretation No C. difficile detected.  Final    Comment: Performed at [facility], [address] [place], [place] [address]  Urine Culture     Status: Abnormal   Collection Time: 11/13/21  6:30 AM   Specimen: Urine, Clean Catch  Result Value Ref Range Status   Specimen Description   Final    URINE, CLEAN CATCH Performed at [facility], [address] [place], [place] [address]    Special Requests   Final    NONE Performed at [facility], [address] [place], [place] [address]    Culture (A)  Final    60,000 COLONIES/mL LACTOBACILLUS SPECIES Standardized susceptibility testing for this organism is not available. Performed at [facility], [place] [address] [place],  [address]    Report Status 11/15/2021 FINAL  Final     Scheduled Meds:  ALPRAZolam  1 mg Oral BID   aspirin  81 mg Oral Daily   conjugated estrogens  1 Applicatorful Vaginal Daily   DULoxetine  60 mg Oral Daily   feeding supplement  1 Container Oral TID BM   insulin aspart  0-9 Units Subcutaneous Q6H   ketorolac  30 mg Intravenous BID   levothyroxine  125 mcg Oral Q0600   methylPREDNISolone (SOLU-MEDROL) injection  40 mg  Intravenous Q12H   metoprolol tartrate  12.5 mg Oral BID   pantoprazole (PROTONIX) IV  40 mg Intravenous Q12H   phenazopyridine  200 mg Oral TID WC   potassium chloride  40 mEq Oral Once   rivaroxaban  20 mg Oral Q supper   senna-docusate  2 tablet Oral BID   sucralfate  1  g Oral TID WC & HS   Continuous Infusions:  sodium chloride 50 mL/hr at 11/14/21 1743   magnesium sulfate bolus IVPB      Procedures/Studies: CT ABDOMEN PELVIS W CONTRAST  Result Date: 11/11/2021 CLINICAL DATA:  Nausea, vomiting, dehydration. History of small-bowel obstruction and prior bowel resections. EXAM: CT ABDOMEN AND PELVIS WITH CONTRAST TECHNIQUE: Multidetector CT imaging of the abdomen and pelvis was performed using the standard protocol following bolus administration of intravenous contrast. CONTRAST:  151mL OMNIPAQUE IOHEXOL 300 MG/ML  SOLN COMPARISON:  06/12/2019 FINDINGS: Lower chest: Included lung bases are clear. Heart size is normal. Right breast prosthesis. Hepatobiliary: No focal liver abnormality is seen. Status post cholecystectomy. Similar degree of postoperative intra and extrahepatic biliary dilatation. Pancreas: Unremarkable. No pancreatic ductal dilatation or surrounding inflammatory changes. Spleen: Normal in size without focal abnormality. Adrenals/Urinary Tract: Unremarkable adrenal glands. Kidneys enhance symmetrically without focal lesion, stone, or hydronephrosis. Ureters are nondilated. Urinary bladder appears unremarkable for the degree of distension. Stomach/Bowel: Stomach within normal limits. Redemonstrated postoperative changes related to prior large and small bowel resections. No dilated loops of small bowel. Mildly dilated colon within the right abdomen with air-fluid levels. Mucosal hyperenhancement within the rectum without focal thickening. Vascular/Lymphatic: No significant vascular findings are present. No enlarged abdominal or pelvic lymph nodes. Reproductive: Status post  hysterectomy. No adnexal masses. Other: Large broad-based ventral midline abdominal wall hernia with postsurgical changes along the abdominal wall. No ascites. No pneumoperitoneum. Musculoskeletal: No acute or significant osseous findings. IMPRESSION: 1. Postsurgical changes to the bowel. Mildly dilated colon within the right abdomen with air-fluid levels suggestive of a diarrheal illness. Partial or developing bowel obstruction is not entirely excluded, particularly given the degree of postsurgical changes along the anterior abdominal wall. Close clinical follow-up recommended. 2. Mucosal hyperenhancement within the rectum without focal thickening, which may represent proctitis. Electronically Signed   By: Davina Poke D.O.   On: 11/11/2021 16:12   DG ABD ACUTE 2+V W 1V CHEST  Result Date: 11/12/2021 CLINICAL DATA:  Abdominal pain, weakness, chest pain EXAM: DG ABDOMEN ACUTE WITH 1 VIEW CHEST COMPARISON:  CT 11/11/2021, chest x-ray 04/13/2016 FINDINGS: Similar appearance of mildly dilated colon, predominantly in the right hemiabdomen. There is no evidence of free intraperitoneal air. Numerous surgical clips in the low abdomen and left upper quadrant. No radiopaque calculi or other significant radiographic abnormality is seen. Heart size and mediastinal contours are within normal limits. Chronic scarring in the left mid lung. Insert clear IMPRESSION: 1. Similar appearance of mildly dilated colon, predominantly in the right hemiabdomen. 2. No acute cardiopulmonary disease. Electronically Signed   By: Davina Poke D.O.   On: 11/12/2021 13:58   ECHOCARDIOGRAM COMPLETE  Result Date: 11/12/2021    ECHOCARDIOGRAM REPORT   Patient Name:   NIKKOL PAI Date of Exam: 11/12/2021 Medical Rec #:  [id number]             Height:       64.0 in Accession #:    [id number]            Weight:       188.0 lb Date of Birth:  05/11/1961             BSA:          1.906 m Patient Age:    60 years              BP:            90/64 mmHg  Patient Gender: F                     HR:           73 bpm. Exam Location:  [facility] Procedure: 2D Echo, Cardiac Doppler and Color Doppler Indications:    Syncope  History:        Patient has prior history of Echocardiogram examinations, most                 recent 09/17/2020. Signs/Symptoms:Chest Pain and Syncope; Risk                 Factors:Hypertension and Dyslipidemia.  Sonographer:    Wenda Low Referring Phys: [phone] DAVID TAT  Sonographer Comments: Suboptimal apical window and patient is morbidly obese. IMPRESSIONS  1. Left ventricular ejection fraction, by estimation, is 60 to 65%. The left ventricle has normal function. The left ventricle has no regional wall motion abnormalities. Left ventricular diastolic parameters were normal.  2. Right ventricular systolic function is normal. The right ventricular size is normal. There is normal pulmonary artery systolic pressure. The estimated right ventricular systolic pressure is 71.6 mmHg.  3. The mitral valve is grossly normal. Trivial mitral valve regurgitation.  4. The aortic valve is tricuspid. Aortic valve regurgitation is not visualized. Aortic valve mean gradient measures 3.0 mmHg. Comparison(s): No significant change from prior study. Prior images reviewed side by side. FINDINGS  Left Ventricle: Left ventricular ejection fraction, by estimation, is 60 to 65%. The left ventricle has normal function. The left ventricle has no regional wall motion abnormalities. The left ventricular internal cavity size was normal in size. There is  no left ventricular hypertrophy. Left ventricular diastolic parameters were normal. Right Ventricle: The right ventricular size is normal. No increase in right ventricular wall thickness. Right ventricular systolic function is normal. There is normal pulmonary artery systolic pressure. The tricuspid regurgitant velocity is 2.16 m/s, and  with an assumed right atrial pressure of 8 mmHg, the estimated right  ventricular systolic pressure is 96.7 mmHg. Left Atrium: Left atrial size was normal in size. Right Atrium: Right atrial size was normal in size. Pericardium: There is no evidence of pericardial effusion. Presence of epicardial fat layer. Mitral Valve: The mitral valve is grossly normal. Trivial mitral valve regurgitation. MV peak gradient, 2.4 mmHg. The mean mitral valve gradient is 1.0 mmHg. Tricuspid Valve: The tricuspid valve is grossly normal. Tricuspid valve regurgitation is trivial. Aortic Valve: The aortic valve is tricuspid. There is mild aortic valve annular calcification. Aortic valve regurgitation is not visualized. Aortic valve mean gradient measures 3.0 mmHg. Aortic valve peak gradient measures 5.7 mmHg. Aortic valve area, by  VTI measures 2.74 cm. Pulmonic Valve: The pulmonic valve was grossly normal. Pulmonic valve regurgitation is trivial. Aorta: The aortic root is normal in size and structure. IAS/Shunts: No atrial level shunt detected by color flow Doppler.  LEFT VENTRICLE PLAX 2D LVIDd:         4.90 cm   Diastology LVIDs:         3.15 cm   LV e' medial:    7.18 cm/s LV PW:         0.90 cm   LV E/e' medial:  9.0 LV IVS:        1.00 cm   LV e' lateral:   8.38 cm/s LVOT diam:     2.00 cm   LV E/e' lateral: 7.7 LV SV:  55 LV SV Index:   29 LVOT Area:     3.14 cm  LEFT ATRIUM             Index        RIGHT ATRIUM          Index LA diam:        3.60 cm 1.89 cm/m   RA Area:     9.95 cm LA Vol (A2C):   30.2 ml 15.85 ml/m  RA Volume:   18.30 ml 9.60 ml/m LA Vol (A4C):   22.1 ml 11.60 ml/m LA Biplane Vol: 26.6 ml 13.96 ml/m  AORTIC VALVE                    PULMONIC VALVE AV Area (Vmax):    2.21 cm     PV Vmax:       0.81 m/s AV Area (Vmean):   2.45 cm     PV Peak grad:  2.6 mmHg AV Area (VTI):     2.74 cm AV Vmax:           119.00 cm/s AV Vmean:          74.800 cm/s AV VTI:            0.202 m AV Peak Grad:      5.7 mmHg AV Mean Grad:      3.0 mmHg LVOT Vmax:         83.60 cm/s LVOT Vmean:         58.400 cm/s LVOT VTI:          0.176 m LVOT/AV VTI ratio: 0.87  AORTA Ao Root diam: 2.90 cm MITRAL VALVE               TRICUSPID VALVE MV Area (PHT): 4.08 cm    TR Peak grad:   18.7 mmHg MV Area VTI:   2.64 cm    TR Vmax:        216.00 cm/s MV Peak grad:  2.4 mmHg MV Mean grad:  1.0 mmHg    SHUNTS MV Vmax:       0.78 m/s    Systemic VTI:  0.18 m MV Vmean:      48.9 cm/s   Systemic Diam: 2.00 cm MV Decel Time: 186 msec MV E velocity: 64.70 cm/s MV A velocity: 58.70 cm/s MV E/A ratio:  1.10 Rozann Lesches MD Electronically signed by Rozann Lesches MD Signature Date/Time: 11/12/2021/12:33:54 PM    Final    DG HIP UNILAT WITH PELVIS 2-3 VIEWS RIGHT  Result Date: 11/12/2021 CLINICAL DATA:  Fall, right hip pain EXAM: DG HIP (WITH OR WITHOUT PELVIS) 2-3V RIGHT COMPARISON:  CT 11/11/2021 FINDINGS: Subtle linear lucency within the right inferior pubic ramus, indeterminate for fracture. Osseous structures are otherwise intact. Hip joint intact without dislocation. Right hip joint space is maintained. There is soft tissue swelling along the lateral aspect of the hip at the level of the greater trochanter. IMPRESSION: 1. Subtle linear lucency within the right inferior pubic ramus, indeterminate for fracture. No fracture was present at this location on CT performed 1 day ago. 2. Soft tissue swelling along the lateral aspect of the hip. Electronically Signed   By: Davina Poke D.O.   On: 11/12/2021 12:37   DG SMALL BOWEL W DOUBLE CM (HD)  Result Date: 11/14/2021 CLINICAL DATA:  Abdominal pain. History of colon resection. Vomiting. Rule out bowel obstruction. EXAM: SMALL BOWEL SERIES COMPARISON:  CT abdomen pelvis  11/11/2021 TECHNIQUE: Following ingestion of thin barium, serial small bowel images were obtained FLUOROSCOPY TIME:  Fluoroscopy Time:  0 Radiation Exposure Index (if provided by the fluoroscopic device): Number of Acquired Spot Images: 0 FINDINGS: Preliminary KUB demonstrates dilated bowel loops  in the right abdomen and the central abdomen. These appear to be dilated small bowel loops. There are numerous surgical staple lines and clips in the abdomen. Review of the CT reveals apparent colectomy in the proximal sigmoid region. Surgical clips in the gallbladder fossa. Patient ingested barium and serial images were obtained to 180 minutes. Additional imaging recommended as barium has not reached the rectosigmoid at this time. Stomach grossly normal. Stomach empties into the duodenum. A small diverticulum in the third portion the duodenum. The jejunum fills with contrast and is not significantly dilated. On the delayed image at 180 minutes, there is barium in the right upper quadrant which appears to be mildly dilated ileum. No bowel wall edema or mass. IMPRESSION: 1. History of colectomy at the level the proximal sigmoid colon. 2. Barium is slow to transit through the small bowel. Barium filled loops in the right upper quadrant likely dilated ileum related to small-bowel obstruction. This dictation includes images out to 180 minutes. Additional KUB has been requested in 3 hours. Electronically Signed   By: Franchot Gallo M.D.   On: 11/14/2021 16:09   CT Maxillofacial Wo Contrast  Result Date: 11/11/2021 CLINICAL DATA:  Right-sided facial swelling after fall this morning. EXAM: CT MAXILLOFACIAL WITHOUT CONTRAST TECHNIQUE: Multidetector CT imaging of the maxillofacial structures was performed. Multiplanar CT image reconstructions were also generated. COMPARISON:  CT head dated April 13, 2016. CT maxillofacial dated August 11, 2009. FINDINGS: Osseous: No fracture or mandibular dislocation. No destructive process. Orbits: Negative. No traumatic or inflammatory finding. Sinuses: Clear. Soft tissues: Mild right facial soft tissue swelling. Limited intracranial: No significant or unexpected finding. IMPRESSION: 1. No acute maxillofacial fracture. 2. Mild right facial soft tissue swelling. Electronically Signed    By: Titus Dubin M.D.   On: 11/11/2021 16:08    Kathie Dike, MD  [facility]  If 7PM-7AM, please contact night-coverage [email or web address]  11/15/2021, 1:04 PM   LOS: 4 days

## 2021-11-16 DIAGNOSIS — R197 Diarrhea, unspecified: Secondary | ICD-10-CM | POA: Diagnosis not present

## 2021-11-16 DIAGNOSIS — K9189 Other postprocedural complications and disorders of digestive system: Secondary | ICD-10-CM | POA: Diagnosis not present

## 2021-11-16 DIAGNOSIS — G894 Chronic pain syndrome: Secondary | ICD-10-CM | POA: Diagnosis not present

## 2021-11-16 DIAGNOSIS — I10 Essential (primary) hypertension: Secondary | ICD-10-CM | POA: Diagnosis not present

## 2021-11-16 LAB — GLUCOSE, CAPILLARY
Glucose-Capillary: 99 mg/dL (ref 70–99)
Glucose-Capillary: 122 mg/dL — ABNORMAL HIGH (ref 70–99)
Glucose-Capillary: 97 mg/dL (ref 70–99)
Glucose-Capillary: 88 mg/dL (ref 70–99)
Glucose-Capillary: 100 mg/dL — ABNORMAL HIGH (ref 70–99)

## 2021-11-16 LAB — BASIC METABOLIC PANEL
Anion gap: 10 (ref 5–15)
BUN: 15 mg/dL (ref 6–20)
CO2: 20 mmol/L — ABNORMAL LOW (ref 22–32)
Calcium: 9.1 mg/dL (ref 8.9–10.3)
Chloride: 110 mmol/L (ref 98–111)
Creatinine, Ser: 0.66 mg/dL (ref 0.44–1.00)
GFR, Estimated: >60 mL/min (ref >60)
Glucose, Bld: 98 mg/dL (ref 70–99)
Potassium: 4.2 mmol/L (ref 3.5–5.1)
Sodium: 140 mmol/L (ref 135–145)

## 2021-11-16 LAB — MAGNESIUM: Magnesium: 2.4 mg/dL (ref 1.7–2.4)

## 2021-11-16 MED ORDER — RIFAXIMIN 550 MG PO TABS
550.0000 mg | ORAL_TABLET | Freq: Three times a day (TID) | ORAL | Status: DC
  Administered 2021-11-17 – 2021-11-18 (×4): 550 mg via ORAL
  Filled 2021-11-16 (×6): qty 1

## 2021-11-16 MED ORDER — METHYLPREDNISOLONE SODIUM SUCC 40 MG IJ SOLR
40.0000 mg | Freq: Every day | INTRAMUSCULAR | Status: DC
  Administered 2021-11-17 – 2021-11-18 (×2): 40 mg via INTRAVENOUS
  Filled 2021-11-16 (×2): qty 1

## 2021-11-16 MED ORDER — HYDROMORPHONE HCL 2 MG PO TABS
2.0000 mg | ORAL_TABLET | Freq: Once | ORAL | Status: AC
  Administered 2021-11-16: 2 mg via ORAL
  Filled 2021-11-16: qty 1

## 2021-11-16 MED ORDER — PROCHLORPERAZINE EDISYLATE 10 MG/2ML IJ SOLN
10.0000 mg | Freq: Once | INTRAMUSCULAR | Status: AC
Start: 2021-11-16 — End: 2021-11-16
  Administered 2021-11-16: 10 mg via INTRAVENOUS
  Filled 2021-11-16: qty 2

## 2021-11-16 MED ORDER — SODIUM CHLORIDE 0.9 % IV SOLN
125.0000 mg | Freq: Three times a day (TID) | INTRAVENOUS | Status: DC
  Administered 2021-11-16 – 2021-11-18 (×6): 125 mg via INTRAVENOUS
  Filled 2021-11-16 (×9): qty 2.5

## 2021-11-16 NOTE — Progress Notes (Signed)
Subjective:  Patient says she feels miserable.  She has been having nausea and vomiting.  She remains with abdominal pain.  She had 2 bowel movements today and 2 yesterday.  She is passing [place].  Current Medications:  Current Facility-Administered Medications:    0.9 %  sodium chloride infusion, , Intravenous, Continuous, Tat, David, MD, Stopped at 11/15/21 1401   acetaminophen (TYLENOL) tablet 650 mg, 650 mg, Oral, Q6H PRN, 650 mg at 11/15/21 2349 **OR** acetaminophen (TYLENOL) suppository 650 mg, 650 mg, Rectal, Q6H PRN, Emokpae, Ejiroghene E, MD   ALPRAZolam (XANAX) tablet 1 mg, 1 mg, Oral, BID, Emokpae, Ejiroghene E, MD, 1 mg at 11/15/21 2119   aspirin chewable tablet 81 mg, 81 mg, Oral, Daily, Emokpae, Ejiroghene E, MD, 81 mg at 11/15/21 0934   conjugated estrogens (PREMARIN) vaginal cream 1 Applicatorful, 1 Applicatorful, Vaginal, Daily, Tat, David, MD   cycloSPORINE (RESTASIS) 0.05 % ophthalmic emulsion 1 drop, 1 drop, Both Eyes, BID PRN, Emokpae, Ejiroghene E, MD   DULoxetine (CYMBALTA) DR capsule 60 mg, 60 mg, Oral, Daily, Emokpae, Ejiroghene E, MD, 60 mg at 11/15/21 0934   feeding supplement (BOOST / RESOURCE BREEZE) liquid 1 Container, 1 Container, Oral, TID BM, Tat, Shanon Brow, MD, 1 Container at 11/13/21 0834   HYDROmorphone (DILAUDID) injection 1 mg, 1 mg, Intravenous, Q4H PRN, Kathie Dike, MD, 1 mg at 11/16/21 0855   insulin aspart (novoLOG) injection 0-9 Units, 0-9 Units, Subcutaneous, Q6H, Emokpae, Ejiroghene E, MD   levothyroxine (SYNTHROID) tablet 125 mcg, 125 mcg, Oral, Q0600, Emokpae, Ejiroghene E, MD, 125 mcg at 11/16/21 0619   methylPREDNISolone sodium succinate (SOLU-MEDROL) 40 mg/mL injection 40 mg, 40 mg, Intravenous, Q12H, Memon, Jolaine Artist, MD, 40 mg at 11/16/21 0208   metoprolol tartrate (LOPRESSOR) tablet 12.5 mg, 12.5 mg, Oral, BID, Tat, David, MD, 12.5 mg at 11/14/21 0849   ondansetron (ZOFRAN) tablet 4 mg, 4 mg, Oral, Q6H PRN **OR** ondansetron (ZOFRAN)  injection 4 mg, 4 mg, Intravenous, Q6H PRN, Emokpae, Ejiroghene E, MD, 4 mg at 11/16/21 0405   pantoprazole (PROTONIX) injection 40 mg, 40 mg, Intravenous, Q12H, Tat, David, MD, 40 mg at 11/16/21 6789   phenazopyridine (PYRIDIUM) tablet 200 mg, 200 mg, Oral, TID WC, Tat, David, MD, 200 mg at 11/15/21 1814   polyethylene glycol (MIRALAX / GLYCOLAX) packet 17 g, 17 g, Oral, Daily PRN, Emokpae, Ejiroghene E, MD   rivaroxaban (XARELTO) tablet 20 mg, 20 mg, Oral, Q supper, Emokpae, Ejiroghene E, MD, 20 mg at 11/15/21 1814   senna-docusate (Senokot-S) tablet 2 tablet, 2 tablet, Oral, BID, Emokpae, Ejiroghene E, MD, 2 tablet at 11/15/21 2117   sucralfate (CARAFATE) 1 GM/10ML suspension 1 g, 1 g, Oral, TID WC & HS, Joaquina Nissen U, MD, 1 g at 11/15/21 2119   Objective: Blood pressure 105/63, pulse 82, temperature 98.3 F (36.8 C), resp. rate 16, height '5\' 4"'  (1.626 m), weight 85.3 kg, SpO2 (!) 88 %. Patient is awake and lying in bed holding her emesis basin. Upper part of the abdomen is distended.  Hernia is unchanged.  Bowel sounds are present.  She remains with generalized tenderness which is more pronounced with across upper abdomen.  No organomegaly or masses. Erythromycin  Labs/studies Results:   CBC Latest Ref Rng & Units 11/15/2021 11/13/2021 11/12/2021  WBC 4.0 - 10.5 K/uL 6.7 5.6 5.8  Hemoglobin 12.0 - 15.0 g/dL 13.4 13.2 12.8  Hematocrit 36.0 - 46.0 % 39.7 40.7 38.5  Platelets 150 - 400 K/uL 186 172 191    CMP  Latest Ref Rng & Units 11/16/2021 11/15/2021 11/13/2021  Glucose 70 - 99 mg/dL 98 90 97  BUN 6 - 20 mg/dL '15 7 10  ' Creatinine 0.44 - 1.00 mg/dL 0.66 0.63 0.57  Sodium 135 - 145 mmol/L 140 141 140  Potassium 3.5 - 5.1 mmol/L 4.2 3.3(L) 3.2(L)  Chloride 98 - 111 mmol/L 110 109 112(H)  CO2 22 - 32 mmol/L 20(L) 25 21(L)  Calcium 8.9 - 10.3 mg/dL 9.1 8.8(L) 8.8(L)  Total Protein 6.5 - 8.1 g/dL - - 5.6(L)  Total Bilirubin 0.3 - 1.2 mg/dL - - 0.5  Alkaline Phos 38 - 126 U/L - -  77  AST 15 - 41 U/L - - 26  ALT 0 - 44 U/L - - 31    Hepatic Function Latest Ref Rng & Units 11/13/2021 11/11/2021 09/11/2021  Total Protein 6.5 - 8.1 g/dL 5.6(L) 8.0 6.9  Albumin 3.5 - 5.0 g/dL 3.2(L) 4.6 4.2  AST 15 - 41 U/L 26 37 23  ALT 0 - 44 U/L 31 44 23  Alk Phosphatase 38 - 126 U/L 77 98 88  Total Bilirubin 0.3 - 1.2 mg/dL 0.5 1.2 0.5  Bilirubin, Direct 0.0 - 0.3 mg/dL - - -   Assessment:  Abdominal pain associate with nausea and vomiting.  She had small bowel follow-through 2 days ago and barium took forever to get to rectum.  No stricture could be identified.  Contrast also stayed in the stomach for a long time.  Suspect GI pseudoobstruction or gastroparesis as well as small bowel dysmotility.  Stricture still remains a possibility given history of multiple surgeries.  She cannot take metoclopramide. Pain medication is making GI symptoms worse.     Recommendations   Erythromycin 125 mg IV AC. Xifaxan 550 mg p.o. 3 times daily.

## 2021-11-16 NOTE — Progress Notes (Signed)
Pt has refused mostly all of her PO medicines today due to increased nausea. Pt has also refused all meals. MD is aware. Will continue to monitor.

## 2021-11-16 NOTE — Progress Notes (Signed)
Tap water enema given 500 cc. Pt tolerated well with results. Pt had large liquid BM.

## 2021-11-16 NOTE — Progress Notes (Signed)
PROGRESS NOTE  [name] EHU:[id number] DOB: 1961/03/03 DOA: 11/11/2021 PCP: Fayrene Helper, MD  Brief History:  60 year old female with a history of fibromyalgia, ovarian cancer, left breast cancer, multiple abdominal surgeries, IVC filter March 2017, pulmonary embolus, anxiety, chronic pain syndrome, fractured IVC, hypertension, hyperlipidemia presenting with nausea, vomiting, diarrhea, and abdominal pain that began on 11/09/2021.  The patient recently was discharged from [facility] after a stay from 10/25/2021 to 10/29/2021 when she was treated for presumptive partial small bowel obstruction.  The patient was treated conservatively and improved with medical therapy.  A CT of the abdomen and pelvis during that admission showed multiple dilated small bowel loops in the right lower quadrant, but also showed stool and gas in the colon.  The patient stated that she was feeling fine when she went home until her symptoms began on 11/09/2021.  The patient states that she eats a lot of salads.  She began having worsening abdominal pain and diarrhea and vomiting.  There is no hematochezia or melena.  The patient went to see her GI physician on 11/11/2021, and she was directed to the emergency department for further evaluation.  The patient denies any fever, chills, headache, neck pain, shortness breath, cough, hemoptysis. In addition, the patient states that she has been feeling dizzy for the past 2 days.  The patient was standing up folding close when she had a syncopal episode on 11/11/2021.  This was preceded by worsening dizziness.   Assessment/Plan: Intractable nausea/vomiting/abdominal pain/Diarrhea -Doubt small bowel obstruction as the patient is having bowel movements and passing flatus -11/17--had one large BM -11/18--had BM -11/15 CT abd--mildly dilated colon in the right abdomen with air-fluid levels suggesting diarrheal illness.  Mucosal enhancement within the rectum.  No  dilated small bowel loops. -GI consult appreciated>>concerned about SB stricture -11/18--planning SB follow through study -11/18--SBFT>>Barium is slow to transit through the small bowel. Barium filled loops in the right upper quadrant likely dilated ileum related to small-bowel obstruction. No obvious stricture on this imaging -Stool pathogen panel--neg -Stool for C. Difficile--negative -Clear liquid diet for now>>full liquids>>heart healthy diet on 11/18 -PPI twice daily -carafate started -continue IV dilaudid as needed for pain -suspect she has narcotic bowel syndrome -GI recommends that patient take prucalopride after discharge -Due to persistent nausea and vomiting and concerns for possible gastroparesis, started on IV erythromycin since she has not tolerated metoclopramide -Also started on Xifaxan per GI   Syncope -Remain on telemetry--no concerning dysrhythmia -Echocardiogram--EF 60-65%, no WMA, trivial MR -Secondary to volume depletion and vasovagal response -Continue IV fluids   Chronic pain syndrome -PDMP reviewed -She is chronically on hydromorphone 4 mg, last filled 10/13/2021, #90 -she is followed in Dr. Freddie Apley office   Anxiety -Continue home dose alprazolam 1 mg -Last refill 10/13/2021, #60 -Continue Cymbalta   DVT/PE -Continue rivaroxaban   Hypothyroidism -Continue Synthroid    Diabetes mellitus type 2 -NovoLog sliding scale>>discontinue -She takes Ozempic every Monday and metformin for weight loss -A1C--5.1   Hypertension -Continue metoprolol at lower dose   Dysuria -UA not suggestive UTI -d/c ceftriaxone -pyridium -restarted premarin cream   Hypokalemia -replete -Magnesium also replaced   Right Hip Pain -overall improved; able to ambulate without difficulty -11/16 xray hip--Subtle linear lucency within the right inferior pubic ramus, indeterminate for fracture -11/15 CT abd/pelvis--no pelvic fracture -patient is able to ambulate without  significant pain in hip, so doubt any real hip fracture pathology   Right  sided low back pain -patient is concerned that she may have sciatic pain which is worse after fall prior to admission -positive straight leg raise on the right -reports that toradol has helped in the past -will give a dose of toradol and start on course of steroids -Appears to be slowly improving  Obesity -BMI 32.27       Status is: Inpatient   The patient will require care spanning > 2 midnights and should be moved to inpatient because: severity of illness requiring IVF, GI consult and work up and intolerance to diet             Family Communication:  no Family at bedside   Consultants:  GI   Code Status:  FULL    DVT Prophylaxis:  Xarelto     Procedures: As Listed in Progress Note Above   Antibiotics: None     Subjective: Feels that back pain is a little better today as compared to yesterday.  She had difficulty with persistent nausea and vomiting overnight.  Has refused most p.o. intake including medications through the day today.  Objective: Vitals:   11/16/21 0222 11/16/21 0409 11/16/21 0547 11/16/21 1430  BP: 97/71 101/63 105/63 110/66  Pulse: 88  82 80  Resp:   16 16  Temp:   98.3 F (36.8 C) 98.4 F (36.9 C)  TempSrc:    Oral  SpO2:   (!) 88% 92%  Weight:      Height:        Intake/Output Summary (Last 24 hours) at 11/16/2021 1822 Last data filed at 11/16/2021 1810 Gross per 24 hour  Intake 1312.56 ml  Output --  Net 1312.56 ml   Weight change:  Exam:  General exam: Alert, awake, oriented x 3 Respiratory system: Clear to auscultation. Respiratory effort normal. Cardiovascular system:RRR. No murmurs, rubs, gallops. Gastrointestinal system: Abdomen is nondistended, soft and nontender. No organomegaly or masses felt. Normal bowel sounds heard. Central nervous system: Alert and oriented. No focal neurological deficits. +straight leg test on the right Extremities: No  C/C/E, +pedal pulses Skin: No rashes, lesions or ulcers Psychiatry: Judgement and insight appear normal. Mood & affect appropriate.     Data Reviewed: I have personally reviewed following labs and imaging studies Basic Metabolic Panel: Recent Labs  Lab 11/11/21 1307 11/11/21 1317 11/12/21 0517 11/13/21 0611 11/15/21 0525 11/16/21 0353  NA  --  134* 138 140 141 140  K  --  3.6 3.4* 3.2* 3.3* 4.2  CL  --  104 111 112* 109 110  CO2  --  20* 21* 21* 25 20*  GLUCOSE  --  107* 87 97 90 98  BUN  --  22* 18 10 7 15   CREATININE  --  0.82 0.70 0.57 0.63 0.66  CALCIUM  --  9.9 8.6* 8.8* 8.8* 9.1  MG 1.7  --   --  1.7 1.5* 2.4   Liver Function Tests: Recent Labs  Lab 11/11/21 1317 11/13/21 0611  AST 37 26  ALT 44 31  ALKPHOS 98 77  BILITOT 1.2 0.5  PROT 8.0 5.6*  ALBUMIN 4.6 3.2*   Recent Labs  Lab 11/11/21 1317  LIPASE 40   No results for input(s): AMMONIA in the last 168 hours. Coagulation Profile: No results for input(s): INR, PROTIME in the last 168 hours. CBC: Recent Labs  Lab 11/11/21 1305 11/12/21 0517 11/13/21 0611 11/15/21 0525  WBC 9.4 5.8 5.6 6.7  HGB 16.4* 12.8 13.2 13.4  HCT 49.0*  38.5 40.7 39.7  MCV 90.4 91.4 92.5 90.6  PLT 243 191 172 186   Cardiac Enzymes: No results for input(s): CKTOTAL, CKMB, CKMBINDEX, TROPONINI in the last 168 hours. BNP: Invalid input(s): POCBNP CBG: Recent Labs  Lab 11/15/21 2356 11/16/21 0554 11/16/21 0734 11/16/21 1123 11/16/21 1610  GLUCAP 113* 99 122* 97 88   HbA1C: No results for input(s): HGBA1C in the last 72 hours.  Urine analysis:    Component Value Date/Time   COLORURINE YELLOW 11/13/2021 0630   APPEARANCEUR CLEAR 11/13/2021 0630   LABSPEC 1.009 11/13/2021 0630   PHURINE 6.0 11/13/2021 0630   GLUCOSEU NEGATIVE 11/13/2021 0630   GLUCOSEU NEG mg/dL 09/05/2007 2337   HGBUR NEGATIVE 11/13/2021 0630   HGBUR negative 03/13/2011 0957   BILIRUBINUR NEGATIVE 11/13/2021 0630   BILIRUBINUR small (A)  07/18/2021 1411   BILIRUBINUR neg 06/23/2018 1434   KETONESUR NEGATIVE 11/13/2021 0630   PROTEINUR NEGATIVE 11/13/2021 0630   UROBILINOGEN 4.0 (A) 07/18/2021 1411   UROBILINOGEN 0.2 07/03/2015 2014   NITRITE NEGATIVE 11/13/2021 0630   LEUKOCYTESUR NEGATIVE 11/13/2021 0630   Sepsis Labs: @LABRCNTIP (procalcitonin:4,lacticidven:4) ) Recent Results (from the past 240 hour(s))  Resp Panel by RT-PCR (Flu A&B, Covid) Nasopharyngeal Swab     Status: None   Collection Time: 11/11/21  4:52 PM   Specimen: Nasopharyngeal Swab; Nasopharyngeal(NP) swabs in vial transport medium  Result Value Ref Range Status   SARS Coronavirus 2 by RT PCR NEGATIVE NEGATIVE Final    Comment: (NOTE) SARS-CoV-2 target nucleic acids are NOT DETECTED.  The SARS-CoV-2 RNA is generally detectable in upper respiratory specimens during the acute phase of infection. The lowest concentration of SARS-CoV-2 viral copies this assay can detect is 138 copies/mL. A negative result does not preclude SARS-Cov-2 infection and should not be used as the sole basis for treatment or other patient management decisions. A negative result may occur with  improper specimen collection/handling, submission of specimen other than nasopharyngeal swab, presence of viral mutation(s) within the areas targeted by this assay, and inadequate number of viral copies(<138 copies/mL). A negative result must be combined with clinical observations, patient history, and epidemiological information. The expected result is Negative.  Fact Sheet for Patients:  EntrepreneurPulse.com.au  Fact Sheet for Healthcare Providers:  IncredibleEmployment.be  This test is no t yet approved or cleared by the [place] FDA and  has been authorized for detection and/or diagnosis of SARS-CoV-2 by FDA under an Emergency Use Authorization (EUA). This EUA will remain  in effect (meaning this test can be used) for the duration of  the COVID-19 declaration under Section 564(b)(1) of the Act, 21 U.S.C.section 360bbb-3(b)(1), unless the authorization is terminated  or revoked sooner.       Influenza A by PCR NEGATIVE NEGATIVE Final   Influenza B by PCR NEGATIVE NEGATIVE Final    Comment: (NOTE) The Xpert Xpress SARS-CoV-2/FLU/RSV plus assay is intended as an aid in the diagnosis of influenza from Nasopharyngeal swab specimens and should not be used as a sole basis for treatment. Nasal washings and aspirates are unacceptable for Xpert Xpress SARS-CoV-2/FLU/RSV testing.  Fact Sheet for Patients: EntrepreneurPulse.com.au  Fact Sheet for Healthcare Providers: IncredibleEmployment.be  This test is not yet approved or cleared by the [place] FDA and has been authorized for detection and/or diagnosis of SARS-CoV-2 by FDA under an Emergency Use Authorization (EUA). This EUA will remain in effect (meaning this test can be used) for the duration of the COVID-19 declaration under Section 564(b)(1) of the Act, 21  U.S.C. section 360bbb-3(b)(1), unless the authorization is terminated or revoked.  Performed at [facility], [address], [place], [place] [address]   Gastrointestinal Panel by PCR , Stool     Status: None   Collection Time: 11/11/21  7:20 PM   Specimen: Stool  Result Value Ref Range Status   Campylobacter species NOT DETECTED NOT DETECTED Final   Plesimonas shigelloides NOT DETECTED NOT DETECTED Final   Salmonella species NOT DETECTED NOT DETECTED Final   Yersinia enterocolitica NOT DETECTED NOT DETECTED Final   Vibrio species NOT DETECTED NOT DETECTED Final   Vibrio cholerae NOT DETECTED NOT DETECTED Final   Enteroaggregative E coli (EAEC) NOT DETECTED NOT DETECTED Final   Enteropathogenic E coli (EPEC) NOT DETECTED NOT DETECTED Final   Enterotoxigenic E coli (ETEC) NOT DETECTED NOT DETECTED Final   Shiga like toxin producing E coli (STEC) NOT DETECTED NOT  DETECTED Final   Shigella/Enteroinvasive E coli (EIEC) NOT DETECTED NOT DETECTED Final   Cryptosporidium NOT DETECTED NOT DETECTED Final   Cyclospora cayetanensis NOT DETECTED NOT DETECTED Final   Entamoeba histolytica NOT DETECTED NOT DETECTED Final   Giardia lamblia NOT DETECTED NOT DETECTED Final   Adenovirus F40/41 NOT DETECTED NOT DETECTED Final   Astrovirus NOT DETECTED NOT DETECTED Final   Norovirus GI/GII NOT DETECTED NOT DETECTED Final   Rotavirus A NOT DETECTED NOT DETECTED Final   Sapovirus (I, II, IV, and V) NOT DETECTED NOT DETECTED Final    Comment: Performed at [facility], [address] [place], [place] [address]  C Difficile Quick Screen w PCR reflex     Status: None   Collection Time: 11/11/21  7:20 PM   Specimen: Stool  Result Value Ref Range Status   C Diff antigen NEGATIVE NEGATIVE Final   C Diff toxin NEGATIVE NEGATIVE Final   C Diff interpretation No C. difficile detected.  Final    Comment: Performed at [facility], [address] [place], [place] [address]  Urine Culture     Status: Abnormal   Collection Time: 11/13/21  6:30 AM   Specimen: Urine, Clean Catch  Result Value Ref Range Status   Specimen Description   Final    URINE, CLEAN CATCH Performed at [facility], [address] [place], [place] [address]    Special Requests   Final    NONE Performed at [facility], [address], [place], [place] [address]    Culture (A)  Final    60,000 COLONIES/mL LACTOBACILLUS SPECIES Standardized susceptibility testing for this organism is not available. Performed at [facility], [place] [address], [place], [place] [address]    Report Status 11/15/2021 FINAL  Final     Scheduled Meds:  ALPRAZolam  1 mg Oral BID   aspirin  81 mg Oral Daily   conjugated estrogens  1 Applicatorful Vaginal Daily   DULoxetine  60 mg Oral Daily   feeding supplement  1 Container Oral TID BM   insulin aspart  0-9 Units Subcutaneous Q6H   levothyroxine  125 mcg  Oral Q0600   [START ON 11/17/2021] methylPREDNISolone (SOLU-MEDROL) injection  40 mg Intravenous Daily   metoprolol tartrate  12.5 mg Oral BID   pantoprazole (PROTONIX) IV  40 mg Intravenous Q12H   phenazopyridine  200 mg Oral TID WC   rifaximin  550 mg Oral TID   rivaroxaban  20 mg Oral Q supper   senna-docusate  2 tablet Oral BID   sucralfate  1 g Oral  TID WC & HS   Continuous Infusions:  sodium chloride Stopped (11/15/21 1401)   erythromycin      Procedures/Studies: DG Abd 1 View  Result Date: 11/15/2021 CLINICAL DATA:  Small bowel study done yesterday.  Pain. EXAM: ABDOMEN - 1 VIEW COMPARISON:  November 14, 2021 FINDINGS: The contrast has exited the small bowel and is now seen throughout the length of the colon reaching the level the rectum. Air-filled prominent loops of bowel remain. IMPRESSION: The contrast now traverses the colon to the level the rectum. Air-filled prominent loops of primarily colon remain. Electronically Signed   By: Dorise Bullion III M.D.   On: 11/15/2021 19:12   CT ABDOMEN PELVIS W CONTRAST  Result Date: 11/11/2021 CLINICAL DATA:  Nausea, vomiting, dehydration. History of small-bowel obstruction and prior bowel resections. EXAM: CT ABDOMEN AND PELVIS WITH CONTRAST TECHNIQUE: Multidetector CT imaging of the abdomen and pelvis was performed using the standard protocol following bolus administration of intravenous contrast. CONTRAST:  17mL OMNIPAQUE IOHEXOL 300 MG/ML  SOLN COMPARISON:  06/12/2019 FINDINGS: Lower chest: Included lung bases are clear. Heart size is normal. Right breast prosthesis. Hepatobiliary: No focal liver abnormality is seen. Status post cholecystectomy. Similar degree of postoperative intra and extrahepatic biliary dilatation. Pancreas: Unremarkable. No pancreatic ductal dilatation or surrounding inflammatory changes. Spleen: Normal in size without focal abnormality. Adrenals/Urinary Tract: Unremarkable adrenal glands. Kidneys enhance  symmetrically without focal lesion, stone, or hydronephrosis. Ureters are nondilated. Urinary bladder appears unremarkable for the degree of distension. Stomach/Bowel: Stomach within normal limits. Redemonstrated postoperative changes related to prior large and small bowel resections. No dilated loops of small bowel. Mildly dilated colon within the right abdomen with air-fluid levels. Mucosal hyperenhancement within the rectum without focal thickening. Vascular/Lymphatic: No significant vascular findings are present. No enlarged abdominal or pelvic lymph nodes. Reproductive: Status post hysterectomy. No adnexal masses. Other: Large broad-based ventral midline abdominal wall hernia with postsurgical changes along the abdominal wall. No ascites. No pneumoperitoneum. Musculoskeletal: No acute or significant osseous findings. IMPRESSION: 1. Postsurgical changes to the bowel. Mildly dilated colon within the right abdomen with air-fluid levels suggestive of a diarrheal illness. Partial or developing bowel obstruction is not entirely excluded, particularly given the degree of postsurgical changes along the anterior abdominal wall. Close clinical follow-up recommended. 2. Mucosal hyperenhancement within the rectum without focal thickening, which may represent proctitis. Electronically Signed   By: Davina Poke D.O.   On: 11/11/2021 16:12   DG ABD ACUTE 2+V W 1V CHEST  Result Date: 11/12/2021 CLINICAL DATA:  Abdominal pain, weakness, chest pain EXAM: DG ABDOMEN ACUTE WITH 1 VIEW CHEST COMPARISON:  CT 11/11/2021, chest x-ray 04/13/2016 FINDINGS: Similar appearance of mildly dilated colon, predominantly in the right hemiabdomen. There is no evidence of free intraperitoneal air. Numerous surgical clips in the low abdomen and left upper quadrant. No radiopaque calculi or other significant radiographic abnormality is seen. Heart size and mediastinal contours are within normal limits. Chronic scarring in the left mid  lung. Insert clear IMPRESSION: 1. Similar appearance of mildly dilated colon, predominantly in the right hemiabdomen. 2. No acute cardiopulmonary disease. Electronically Signed   By: Davina Poke D.O.   On: 11/12/2021 13:58   ECHOCARDIOGRAM COMPLETE  Result Date: 11/12/2021    ECHOCARDIOGRAM REPORT   Patient Name:   LALONI ROWTON Date of Exam: 11/12/2021 Medical Rec #:  [id number]             Height:       64.0 in Accession #:  [id number]            Weight:       188.0 lb Date of Birth:  [birth year]             BSA:          1.906 m Patient Age:    60 years              BP:           90/64 mmHg Patient Gender: F                     HR:           73 bpm. Exam Location:  [facility] Procedure: 2D Echo, Cardiac Doppler and Color Doppler Indications:    Syncope  History:        Patient has prior history of Echocardiogram examinations, most                 recent 09/17/2020. Signs/Symptoms:Chest Pain and Syncope; Risk                 Factors:Hypertension and Dyslipidemia.  Sonographer:    Wenda Low Referring Phys: [phone] DAVID TAT  Sonographer Comments: Suboptimal apical window and patient is morbidly obese. IMPRESSIONS  1. Left ventricular ejection fraction, by estimation, is 60 to 65%. The left ventricle has normal function. The left ventricle has no regional wall motion abnormalities. Left ventricular diastolic parameters were normal.  2. Right ventricular systolic function is normal. The right ventricular size is normal. There is normal pulmonary artery systolic pressure. The estimated right ventricular systolic pressure is 71.2 mmHg.  3. The mitral valve is grossly normal. Trivial mitral valve regurgitation.  4. The aortic valve is tricuspid. Aortic valve regurgitation is not visualized. Aortic valve mean gradient measures 3.0 mmHg. Comparison(s): No significant change from prior study. Prior images reviewed side by side. FINDINGS  Left Ventricle: Left ventricular ejection fraction, by  estimation, is 60 to 65%. The left ventricle has normal function. The left ventricle has no regional wall motion abnormalities. The left ventricular internal cavity size was normal in size. There is  no left ventricular hypertrophy. Left ventricular diastolic parameters were normal. Right Ventricle: The right ventricular size is normal. No increase in right ventricular wall thickness. Right ventricular systolic function is normal. There is normal pulmonary artery systolic pressure. The tricuspid regurgitant velocity is 2.16 m/s, and  with an assumed right atrial pressure of 8 mmHg, the estimated right ventricular systolic pressure is 45.8 mmHg. Left Atrium: Left atrial size was normal in size. Right Atrium: Right atrial size was normal in size. Pericardium: There is no evidence of pericardial effusion. Presence of epicardial fat layer. Mitral Valve: The mitral valve is grossly normal. Trivial mitral valve regurgitation. MV peak gradient, 2.4 mmHg. The mean mitral valve gradient is 1.0 mmHg. Tricuspid Valve: The tricuspid valve is grossly normal. Tricuspid valve regurgitation is trivial. Aortic Valve: The aortic valve is tricuspid. There is mild aortic valve annular calcification. Aortic valve regurgitation is not visualized. Aortic valve mean gradient measures 3.0 mmHg. Aortic valve peak gradient measures 5.7 mmHg. Aortic valve area, by  VTI measures 2.74 cm. Pulmonic Valve: The pulmonic valve was grossly normal. Pulmonic valve regurgitation is trivial. Aorta: The aortic root is normal in size and structure. IAS/Shunts: No atrial level shunt detected by color flow Doppler.  LEFT VENTRICLE PLAX 2D LVIDd:         4.90 cm   Diastology  LVIDs:         3.15 cm   LV e' medial:    7.18 cm/s LV PW:         0.90 cm   LV E/e' medial:  9.0 LV IVS:        1.00 cm   LV e' lateral:   8.38 cm/s LVOT diam:     2.00 cm   LV E/e' lateral: 7.7 LV SV:         55 LV SV Index:   29 LVOT Area:     3.14 cm  LEFT ATRIUM             Index         RIGHT ATRIUM          Index LA diam:        3.60 cm 1.89 cm/m   RA Area:     9.95 cm LA Vol (A2C):   30.2 ml 15.85 ml/m  RA Volume:   18.30 ml 9.60 ml/m LA Vol (A4C):   22.1 ml 11.60 ml/m LA Biplane Vol: 26.6 ml 13.96 ml/m  AORTIC VALVE                    PULMONIC VALVE AV Area (Vmax):    2.21 cm     PV Vmax:       0.81 m/s AV Area (Vmean):   2.45 cm     PV Peak grad:  2.6 mmHg AV Area (VTI):     2.74 cm AV Vmax:           119.00 cm/s AV Vmean:          74.800 cm/s AV VTI:            0.202 m AV Peak Grad:      5.7 mmHg AV Mean Grad:      3.0 mmHg LVOT Vmax:         83.60 cm/s LVOT Vmean:        58.400 cm/s LVOT VTI:          0.176 m LVOT/AV VTI ratio: 0.87  AORTA Ao Root diam: 2.90 cm MITRAL VALVE               TRICUSPID VALVE MV Area (PHT): 4.08 cm    TR Peak grad:   18.7 mmHg MV Area VTI:   2.64 cm    TR Vmax:        216.00 cm/s MV Peak grad:  2.4 mmHg MV Mean grad:  1.0 mmHg    SHUNTS MV Vmax:       0.78 m/s    Systemic VTI:  0.18 m MV Vmean:      48.9 cm/s   Systemic Diam: 2.00 cm MV Decel Time: 186 msec MV E velocity: 64.70 cm/s MV A velocity: 58.70 cm/s MV E/A ratio:  1.10 Rozann Lesches MD Electronically signed by Rozann Lesches MD Signature Date/Time: 11/12/2021/12:33:54 PM    Final    DG HIP UNILAT WITH PELVIS 2-3 VIEWS RIGHT  Result Date: 11/12/2021 CLINICAL DATA:  Fall, right hip pain EXAM: DG HIP (WITH OR WITHOUT PELVIS) 2-3V RIGHT COMPARISON:  CT 11/11/2021 FINDINGS: Subtle linear lucency within the right inferior pubic ramus, indeterminate for fracture. Osseous structures are otherwise intact. Hip joint intact without dislocation. Right hip joint space is maintained. There is soft tissue swelling along the lateral aspect of the hip at the level of the greater trochanter. IMPRESSION: 1. Subtle linear  lucency within the right inferior pubic ramus, indeterminate for fracture. No fracture was present at this location on CT performed 1 day ago. 2. Soft tissue swelling along the  lateral aspect of the hip. Electronically Signed   By: Davina Poke D.O.   On: 11/12/2021 12:37   DG SMALL BOWEL W DOUBLE CM (HD)  Result Date: 11/14/2021 CLINICAL DATA:  Abdominal pain. History of colon resection. Vomiting. Rule out bowel obstruction. EXAM: SMALL BOWEL SERIES COMPARISON:  CT abdomen pelvis 11/11/2021 TECHNIQUE: Following ingestion of thin barium, serial small bowel images were obtained FLUOROSCOPY TIME:  Fluoroscopy Time:  0 Radiation Exposure Index (if provided by the fluoroscopic device): Number of Acquired Spot Images: 0 FINDINGS: Preliminary KUB demonstrates dilated bowel loops in the right abdomen and the central abdomen. These appear to be dilated small bowel loops. There are numerous surgical staple lines and clips in the abdomen. Review of the CT reveals apparent colectomy in the proximal sigmoid region. Surgical clips in the gallbladder fossa. Patient ingested barium and serial images were obtained to 180 minutes. Additional imaging recommended as barium has not reached the rectosigmoid at this time. Stomach grossly normal. Stomach empties into the duodenum. A small diverticulum in the third portion the duodenum. The jejunum fills with contrast and is not significantly dilated. On the delayed image at 180 minutes, there is barium in the right upper quadrant which appears to be mildly dilated ileum. No bowel wall edema or mass. IMPRESSION: 1. History of colectomy at the level the proximal sigmoid colon. 2. Barium is slow to transit through the small bowel. Barium filled loops in the right upper quadrant likely dilated ileum related to small-bowel obstruction. This dictation includes images out to 180 minutes. Additional KUB has been requested in 3 hours. Electronically Signed   By: Franchot Gallo M.D.   On: 11/14/2021 16:09   CT Maxillofacial Wo Contrast  Result Date: 11/11/2021 CLINICAL DATA:  Right-sided facial swelling after fall this morning. EXAM: CT MAXILLOFACIAL WITHOUT  CONTRAST TECHNIQUE: Multidetector CT imaging of the maxillofacial structures was performed. Multiplanar CT image reconstructions were also generated. COMPARISON:  CT head dated April 13, 2016. CT maxillofacial dated August 11, 2009. FINDINGS: Osseous: No fracture or mandibular dislocation. No destructive process. Orbits: Negative. No traumatic or inflammatory finding. Sinuses: Clear. Soft tissues: Mild right facial soft tissue swelling. Limited intracranial: No significant or unexpected finding. IMPRESSION: 1. No acute maxillofacial fracture. 2. Mild right facial soft tissue swelling. Electronically Signed   By: Titus Dubin M.D.   On: 11/11/2021 16:08    Kathie Dike, MD  [facility]  If 7PM-7AM, please contact night-coverage [email or web address]  11/16/2021, 6:22 PM   LOS: 5 days

## 2021-11-16 NOTE — Progress Notes (Addendum)
Pt c/o of increased n/v. She states she's not able to take anything PO and her pain is 10:10. MD is aware. Will continue to monitor pt.

## 2021-11-16 NOTE — Progress Notes (Signed)
Patient having nausea. Gave Zofran at 2142. Patient vomited at 2200. Will continue to monitor.

## 2021-11-16 NOTE — Progress Notes (Signed)
Patient having pain 10/10. Patient blood pressure 96/52. Patient requesting PRN Dilaudid. Per MD Adefeso give patient tylenol now until BP improves. Will continue to monitor.

## 2021-11-16 NOTE — Progress Notes (Signed)
Patient still in pain. 10/10. BP rechecked. 106/62. MD Adefeso notified. Received order for PO Dilaudid. Will continue to monitor.

## 2021-11-17 DIAGNOSIS — R197 Diarrhea, unspecified: Secondary | ICD-10-CM | POA: Diagnosis not present

## 2021-11-17 DIAGNOSIS — K9189 Other postprocedural complications and disorders of digestive system: Secondary | ICD-10-CM | POA: Diagnosis not present

## 2021-11-17 DIAGNOSIS — G894 Chronic pain syndrome: Secondary | ICD-10-CM | POA: Diagnosis not present

## 2021-11-17 DIAGNOSIS — I10 Essential (primary) hypertension: Secondary | ICD-10-CM | POA: Diagnosis not present

## 2021-11-17 LAB — BASIC METABOLIC PANEL
Anion gap: 6 (ref 5–15)
BUN: 24 mg/dL — ABNORMAL HIGH (ref 6–20)
CO2: 20 mmol/L — ABNORMAL LOW (ref 22–32)
Calcium: 8.5 mg/dL — ABNORMAL LOW (ref 8.9–10.3)
Chloride: 112 mmol/L — ABNORMAL HIGH (ref 98–111)
Creatinine, Ser: 0.73 mg/dL (ref 0.44–1.00)
GFR, Estimated: >60 mL/min (ref >60)
Glucose, Bld: 90 mg/dL (ref 70–99)
Potassium: 4.1 mmol/L (ref 3.5–5.1)
Sodium: 138 mmol/L (ref 135–145)

## 2021-11-17 LAB — GLUCOSE, CAPILLARY
Glucose-Capillary: 87 mg/dL (ref 70–99)
Glucose-Capillary: 112 mg/dL — ABNORMAL HIGH (ref 70–99)
Glucose-Capillary: 134 mg/dL — ABNORMAL HIGH (ref 70–99)
Glucose-Capillary: 119 mg/dL — ABNORMAL HIGH (ref 70–99)

## 2021-11-17 NOTE — Progress Notes (Signed)
PROGRESS NOTE  [name] FGH:[id number] DOB: 1961-06-22 DOA: 11/11/2021 PCP: Fayrene Helper, MD  Brief History:  60 year old female with a history of fibromyalgia, ovarian cancer, left breast cancer, multiple abdominal surgeries, IVC filter March 2017, pulmonary embolus, anxiety, chronic pain syndrome, fractured IVC, hypertension, hyperlipidemia presenting with nausea, vomiting, diarrhea, and abdominal pain that began on 11/09/2021.  The patient recently was discharged from [facility] after a stay from 10/25/2021 to 10/29/2021 when she was treated for presumptive partial small bowel obstruction.  The patient was treated conservatively and improved with medical therapy.  A CT of the abdomen and pelvis during that admission showed multiple dilated small bowel loops in the right lower quadrant, but also showed stool and gas in the colon.  The patient stated that she was feeling fine when she went home until her symptoms began on 11/09/2021.  The patient states that she eats a lot of salads.  She began having worsening abdominal pain and diarrhea and vomiting.  There is no hematochezia or melena.  The patient went to see her GI physician on 11/11/2021, and she was directed to the emergency department for further evaluation.  The patient denies any fever, chills, headache, neck pain, shortness breath, cough, hemoptysis. In addition, the patient states that she has been feeling dizzy for the past 2 days.  The patient was standing up folding close when she had a syncopal episode on 11/11/2021.  This was preceded by worsening dizziness.   Assessment/Plan: Intractable nausea/vomiting/abdominal pain/Diarrhea -Doubt small bowel obstruction as the patient is having bowel movements and passing flatus -11/17--had one large BM -11/18--had BM -11/15 CT abd--mildly dilated colon in the right abdomen with air-fluid levels suggesting diarrheal illness.  Mucosal enhancement within the rectum.  No  dilated small bowel loops. -GI consult appreciated>>concerned about SB stricture -11/18--planning SB follow through study -11/18--SBFT>>Barium is slow to transit through the small bowel. Barium filled loops in the right upper quadrant likely dilated ileum related to small-bowel obstruction. No obvious stricture on this imaging -Stool pathogen panel--neg -Stool for C. Difficile--negative -Clear liquid diet for now>>full liquids>>heart healthy diet on 11/18 -PPI twice daily -carafate started -continue IV dilaudid as needed for pain -suspect she has narcotic bowel syndrome -GI recommends that patient take prucalopride after discharge -Due to persistent nausea and vomiting and concerns for possible gastroparesis, started on IV erythromycin since she has not tolerated metoclopramide -Also started on Xifaxan per GI -Appears to be slowly improving.  Possible discharge in a.m. if overall nausea continues to improve.   Syncope -Remain on telemetry--no concerning dysrhythmia -Echocardiogram--EF 60-65%, no WMA, trivial MR -Secondary to volume depletion and vasovagal response -Continue IV fluids   Chronic pain syndrome -PDMP reviewed -She is chronically on hydromorphone 4 mg, last filled 10/13/2021, #90 -she is followed in Dr. Freddie Apley office   Anxiety -Continue home dose alprazolam 1 mg -Last refill 10/13/2021, #60 -Continue Cymbalta   DVT/PE -Continue rivaroxaban   Hypothyroidism -Continue Synthroid    Diabetes mellitus type 2 -NovoLog sliding scale>>discontinue -She takes Ozempic every Monday and metformin for weight loss -A1C--5.1   Hypertension -Continue metoprolol at lower dose   Dysuria -UA not suggestive UTI -d/c ceftriaxone -pyridium -restarted premarin cream   Hypokalemia -replete -Magnesium also replaced   Right Hip Pain -overall improved; able to ambulate without difficulty -11/16 xray hip--Subtle linear lucency within the right inferior pubic ramus,  indeterminate for fracture -11/15 CT abd/pelvis--no pelvic fracture -patient is able to  ambulate without significant pain in hip, so doubt any real hip fracture pathology   Right sided low back pain -patient is concerned that she may have sciatic pain which is worse after fall prior to admission -positive straight leg raise on the right -reports that toradol has helped in the past -will give a dose of toradol and start on course of steroids -Appears to be slowly improving -Plan to transition to prednisone in a.m.  Obesity -BMI 32.27       Status is: Inpatient   The patient will require care spanning > 2 midnights and should be moved to inpatient because: severity of illness requiring IVF, GI consult and work up and intolerance to diet             Family Communication:  no Family at bedside   Consultants:  GI   Code Status:  FULL    DVT Prophylaxis:  Xarelto     Procedures: As Listed in Progress Note Above   Antibiotics: None     Subjective: Overall she is feeling little better today.  Feels as though the erythromycin may be helping.  She is doing better with liquid p.o. intake.  Solids do tend to make her feel full rather quickly.  She is having bowel movements.  Objective: Vitals:   11/17/21 0639 11/17/21 0651 11/17/21 0807 11/17/21 1352  BP: (!) 87/44 (!) 90/52 106/60 (!) 88/63  Pulse: 81  79 79  Resp:    16  Temp:    98.1 F (36.7 C)  TempSrc:    Oral  SpO2:    93%  Weight:      Height:        Intake/Output Summary (Last 24 hours) at 11/17/2021 2028 Last data filed at 11/17/2021 1700 Gross per 24 hour  Intake 1514.03 ml  Output 1650 ml  Net -135.97 ml   Weight change:  Exam:  General exam: Alert, awake, oriented x 3 Respiratory system: Clear to auscultation. Respiratory effort normal. Cardiovascular system:RRR. No murmurs, rubs, gallops. Gastrointestinal system: Abdomen is nondistended, soft and nontender. No organomegaly or masses felt.  Normal bowel sounds heard. Central nervous system: Alert and oriented. No focal neurological deficits. +straight leg test on the right Extremities: No C/C/E, +pedal pulses Skin: No rashes, lesions or ulcers Psychiatry: Judgement and insight appear normal. Mood & affect appropriate.     Data Reviewed: I have personally reviewed following labs and imaging studies Basic Metabolic Panel: Recent Labs  Lab 11/11/21 1307 11/11/21 1317 11/12/21 0517 11/13/21 0611 11/15/21 0525 11/16/21 0353 11/17/21 0315  NA  --    < > 138 140 141 140 138  K  --    < > 3.4* 3.2* 3.3* 4.2 4.1  CL  --    < > 111 112* 109 110 112*  CO2  --    < > 21* 21* 25 20* 20*  GLUCOSE  --    < > 87 97 90 98 90  BUN  --    < > 18 10 7 15  24*  CREATININE  --    < > 0.70 0.57 0.63 0.66 0.73  CALCIUM  --    < > 8.6* 8.8* 8.8* 9.1 8.5*  MG 1.7  --   --  1.7 1.5* 2.4  --    < > = values in this interval not displayed.   Liver Function Tests: Recent Labs  Lab 11/11/21 1317 11/13/21 0611  AST 37 26  ALT 44 31  ALKPHOS  98 77  BILITOT 1.2 0.5  PROT 8.0 5.6*  ALBUMIN 4.6 3.2*   Recent Labs  Lab 11/11/21 1317  LIPASE 40   No results for input(s): AMMONIA in the last 168 hours. Coagulation Profile: No results for input(s): INR, PROTIME in the last 168 hours. CBC: Recent Labs  Lab 11/11/21 1305 11/12/21 0517 11/13/21 0611 11/15/21 0525  WBC 9.4 5.8 5.6 6.7  HGB 16.4* 12.8 13.2 13.4  HCT 49.0* 38.5 40.7 39.7  MCV 90.4 91.4 92.5 90.6  PLT 243 191 172 186   Cardiac Enzymes: No results for input(s): CKTOTAL, CKMB, CKMBINDEX, TROPONINI in the last 168 hours. BNP: Invalid input(s): POCBNP CBG: Recent Labs  Lab 11/16/21 2327 11/17/21 0552 11/17/21 1107 11/17/21 1615 11/17/21 1755  GLUCAP 100* 87 112* 134* 119*   HbA1C: No results for input(s): HGBA1C in the last 72 hours.  Urine analysis:    Component Value Date/Time   COLORURINE YELLOW 11/13/2021 0630   APPEARANCEUR CLEAR 11/13/2021 0630    LABSPEC 1.009 11/13/2021 0630   PHURINE 6.0 11/13/2021 0630   GLUCOSEU NEGATIVE 11/13/2021 0630   GLUCOSEU NEG mg/dL 09/05/2007 2337   HGBUR NEGATIVE 11/13/2021 0630   HGBUR negative 03/13/2011 0957   BILIRUBINUR NEGATIVE 11/13/2021 0630   BILIRUBINUR small (A) 07/18/2021 1411   BILIRUBINUR neg 06/23/2018 1434   KETONESUR NEGATIVE 11/13/2021 0630   PROTEINUR NEGATIVE 11/13/2021 0630   UROBILINOGEN 4.0 (A) 07/18/2021 1411   UROBILINOGEN 0.2 07/03/2015 2014   NITRITE NEGATIVE 11/13/2021 0630   LEUKOCYTESUR NEGATIVE 11/13/2021 0630   Sepsis Labs: @LABRCNTIP (procalcitonin:4,lacticidven:4) ) Recent Results (from the past 240 hour(s))  Resp Panel by RT-PCR (Flu A&B, Covid) Nasopharyngeal Swab     Status: None   Collection Time: 11/11/21  4:52 PM   Specimen: Nasopharyngeal Swab; Nasopharyngeal(NP) swabs in vial transport medium  Result Value Ref Range Status   SARS Coronavirus 2 by RT PCR NEGATIVE NEGATIVE Final    Comment: (NOTE) SARS-CoV-2 target nucleic acids are NOT DETECTED.  The SARS-CoV-2 RNA is generally detectable in upper respiratory specimens during the acute phase of infection. The lowest concentration of SARS-CoV-2 viral copies this assay can detect is 138 copies/mL. A negative result does not preclude SARS-Cov-2 infection and should not be used as the sole basis for treatment or other patient management decisions. A negative result may occur with  improper specimen collection/handling, submission of specimen other than nasopharyngeal swab, presence of viral mutation(s) within the areas targeted by this assay, and inadequate number of viral copies(<138 copies/mL). A negative result must be combined with clinical observations, patient history, and epidemiological information. The expected result is Negative.  Fact Sheet for Patients:  EntrepreneurPulse.com.au  Fact Sheet for Healthcare Providers:  IncredibleEmployment.be  This  test is no t yet approved or cleared by the [place] FDA and  has been authorized for detection and/or diagnosis of SARS-CoV-2 by FDA under an Emergency Use Authorization (EUA). This EUA will remain  in effect (meaning this test can be used) for the duration of the COVID-19 declaration under Section 564(b)(1) of the Act, 21 U.S.C.section 360bbb-3(b)(1), unless the authorization is terminated  or revoked sooner.       Influenza A by PCR NEGATIVE NEGATIVE Final   Influenza B by PCR NEGATIVE NEGATIVE Final    Comment: (NOTE) The Xpert Xpress SARS-CoV-2/FLU/RSV plus assay is intended as an aid in the diagnosis of influenza from Nasopharyngeal swab specimens and should not be used as a sole basis for treatment. Nasal washings and aspirates  are unacceptable for Xpert Xpress SARS-CoV-2/FLU/RSV testing.  Fact Sheet for Patients: EntrepreneurPulse.com.au  Fact Sheet for Healthcare Providers: IncredibleEmployment.be  This test is not yet approved or cleared by the [place] FDA and has been authorized for detection and/or diagnosis of SARS-CoV-2 by FDA under an Emergency Use Authorization (EUA). This EUA will remain in effect (meaning this test can be used) for the duration of the COVID-19 declaration under Section 564(b)(1) of the Act, 21 U.S.C. section 360bbb-3(b)(1), unless the authorization is terminated or revoked.  Performed at [facility], [address] [place], [place] [address]   Gastrointestinal Panel by PCR , Stool     Status: None   Collection Time: 11/11/21  7:20 PM   Specimen: Stool  Result Value Ref Range Status   Campylobacter species NOT DETECTED NOT DETECTED Final   Plesimonas shigelloides NOT DETECTED NOT DETECTED Final   Salmonella species NOT DETECTED NOT DETECTED Final   Yersinia enterocolitica NOT DETECTED NOT DETECTED Final   Vibrio species NOT DETECTED NOT DETECTED Final   Vibrio cholerae NOT DETECTED NOT  DETECTED Final   Enteroaggregative E coli (EAEC) NOT DETECTED NOT DETECTED Final   Enteropathogenic E coli (EPEC) NOT DETECTED NOT DETECTED Final   Enterotoxigenic E coli (ETEC) NOT DETECTED NOT DETECTED Final   Shiga like toxin producing E coli (STEC) NOT DETECTED NOT DETECTED Final   Shigella/Enteroinvasive E coli (EIEC) NOT DETECTED NOT DETECTED Final   Cryptosporidium NOT DETECTED NOT DETECTED Final   Cyclospora cayetanensis NOT DETECTED NOT DETECTED Final   Entamoeba histolytica NOT DETECTED NOT DETECTED Final   Giardia lamblia NOT DETECTED NOT DETECTED Final   Adenovirus F40/41 NOT DETECTED NOT DETECTED Final   Astrovirus NOT DETECTED NOT DETECTED Final   Norovirus GI/GII NOT DETECTED NOT DETECTED Final   Rotavirus A NOT DETECTED NOT DETECTED Final   Sapovirus (I, II, IV, and V) NOT DETECTED NOT DETECTED Final    Comment: Performed at [facility], [address] [place], [place] [address]  C Difficile Quick Screen w PCR reflex     Status: None   Collection Time: 11/11/21  7:20 PM   Specimen: Stool  Result Value Ref Range Status   C Diff antigen NEGATIVE NEGATIVE Final   C Diff toxin NEGATIVE NEGATIVE Final   C Diff interpretation No C. difficile detected.  Final    Comment: Performed at [facility], [address] [place], [place] [address]  Urine Culture     Status: Abnormal   Collection Time: 11/13/21  6:30 AM   Specimen: Urine, Clean Catch  Result Value Ref Range Status   Specimen Description   Final    URINE, CLEAN CATCH Performed at [facility], [address], [place], [place] [address]    Special Requests   Final    NONE Performed at [facility], [address] [place], [place] [address]    Culture (A)  Final    60,000 COLONIES/mL LACTOBACILLUS SPECIES Standardized susceptibility testing for this organism is not available. Performed at [facility], [address] [place], [place] [address]    Report Status 11/15/2021 FINAL  Final      Scheduled Meds:  ALPRAZolam  1 mg Oral BID   aspirin  81 mg Oral Daily   conjugated estrogens  1 Applicatorful Vaginal Daily   DULoxetine  60 mg Oral Daily   feeding supplement  1 Container Oral TID BM   insulin aspart  0-9 Units Subcutaneous Q6H   levothyroxine  125 mcg Oral Q0600   methylPREDNISolone (SOLU-MEDROL) injection  40 mg Intravenous Daily   metoprolol tartrate  12.5 mg Oral BID   pantoprazole (PROTONIX) IV  40 mg Intravenous Q12H   phenazopyridine  200 mg Oral TID WC   rifaximin  550 mg Oral TID   rivaroxaban  20 mg Oral Q supper   senna-docusate  2 tablet Oral BID   sucralfate  1 g Oral TID WC & HS   Continuous Infusions:  sodium chloride 50 mL/hr at 11/17/21 1722   erythromycin 125 mg (11/17/21 1758)    Procedures/Studies: DG Abd 1 View  Result Date: 11/15/2021 CLINICAL DATA:  Small bowel study done yesterday.  Pain. EXAM: ABDOMEN - 1 VIEW COMPARISON:  November 14, 2021 FINDINGS: The contrast has exited the small bowel and is now seen throughout the length of the colon reaching the level the rectum. Air-filled prominent loops of bowel remain. IMPRESSION: The contrast now traverses the colon to the level the rectum. Air-filled prominent loops of primarily colon remain. Electronically Signed   By: Dorise Bullion III M.D.   On: 11/15/2021 19:12   CT ABDOMEN PELVIS W CONTRAST  Result Date: 11/11/2021 CLINICAL DATA:  Nausea, vomiting, dehydration. History of small-bowel obstruction and prior bowel resections. EXAM: CT ABDOMEN AND PELVIS WITH CONTRAST TECHNIQUE: Multidetector CT imaging of the abdomen and pelvis was performed using the standard protocol following bolus administration of intravenous contrast. CONTRAST:  159mL OMNIPAQUE IOHEXOL 300 MG/ML  SOLN COMPARISON:  06/12/2019 FINDINGS: Lower chest: Included lung bases are clear. Heart size is normal. Right breast prosthesis. Hepatobiliary: No focal liver abnormality is seen. Status post cholecystectomy. Similar  degree of postoperative intra and extrahepatic biliary dilatation. Pancreas: Unremarkable. No pancreatic ductal dilatation or surrounding inflammatory changes. Spleen: Normal in size without focal abnormality. Adrenals/Urinary Tract: Unremarkable adrenal glands. Kidneys enhance symmetrically without focal lesion, stone, or hydronephrosis. Ureters are nondilated. Urinary bladder appears unremarkable for the degree of distension. Stomach/Bowel: Stomach within normal limits. Redemonstrated postoperative changes related to prior large and small bowel resections. No dilated loops of small bowel. Mildly dilated colon within the right abdomen with air-fluid levels. Mucosal hyperenhancement within the rectum without focal thickening. Vascular/Lymphatic: No significant vascular findings are present. No enlarged abdominal or pelvic lymph nodes. Reproductive: Status post hysterectomy. No adnexal masses. Other: Large broad-based ventral midline abdominal wall hernia with postsurgical changes along the abdominal wall. No ascites. No pneumoperitoneum. Musculoskeletal: No acute or significant osseous findings. IMPRESSION: 1. Postsurgical changes to the bowel. Mildly dilated colon within the right abdomen with air-fluid levels suggestive of a diarrheal illness. Partial or developing bowel obstruction is not entirely excluded, particularly given the degree of postsurgical changes along the anterior abdominal wall. Close clinical follow-up recommended. 2. Mucosal hyperenhancement within the rectum without focal thickening, which may represent proctitis. Electronically Signed   By: Davina Poke D.O.   On: 11/11/2021 16:12   DG ABD ACUTE 2+V W 1V CHEST  Result Date: 11/12/2021 CLINICAL DATA:  Abdominal pain, weakness, chest pain EXAM: DG ABDOMEN ACUTE WITH 1 VIEW CHEST COMPARISON:  CT 11/11/2021, chest x-ray 04/13/2016 FINDINGS: Similar appearance of mildly dilated colon, predominantly in the right hemiabdomen. There is no  evidence of free intraperitoneal air. Numerous surgical clips in the low abdomen and left upper quadrant. No radiopaque calculi or other significant radiographic abnormality is seen. Heart size and mediastinal contours are within normal limits. Chronic scarring in the left mid lung. Insert clear IMPRESSION: 1. Similar appearance of mildly dilated colon,  predominantly in the right hemiabdomen. 2. No acute cardiopulmonary disease. Electronically Signed   By: Davina Poke D.O.   On: 11/12/2021 13:58   ECHOCARDIOGRAM COMPLETE  Result Date: 11/12/2021    ECHOCARDIOGRAM REPORT   Patient Name:   SHAMRA BRADEEN Date of Exam: 11/12/2021 Medical Rec #:  [id number]             Height:       64.0 in Accession #:    [id number]            Weight:       188.0 lb Date of Birth:  [birth year]             BSA:          1.906 m Patient Age:    57 years              BP:           90/64 mmHg Patient Gender: F                     HR:           73 bpm. Exam Location:  [facility] Procedure: 2D Echo, Cardiac Doppler and Color Doppler Indications:    Syncope  History:        Patient has prior history of Echocardiogram examinations, most                 recent 09/17/2020. Signs/Symptoms:Chest Pain and Syncope; Risk                 Factors:Hypertension and Dyslipidemia.  Sonographer:    Wenda Low Referring Phys: [phone] DAVID TAT  Sonographer Comments: Suboptimal apical window and patient is morbidly obese. IMPRESSIONS  1. Left ventricular ejection fraction, by estimation, is 60 to 65%. The left ventricle has normal function. The left ventricle has no regional wall motion abnormalities. Left ventricular diastolic parameters were normal.  2. Right ventricular systolic function is normal. The right ventricular size is normal. There is normal pulmonary artery systolic pressure. The estimated right ventricular systolic pressure is 67.6 mmHg.  3. The mitral valve is grossly normal. Trivial mitral valve regurgitation.  4. The aortic  valve is tricuspid. Aortic valve regurgitation is not visualized. Aortic valve mean gradient measures 3.0 mmHg. Comparison(s): No significant change from prior study. Prior images reviewed side by side. FINDINGS  Left Ventricle: Left ventricular ejection fraction, by estimation, is 60 to 65%. The left ventricle has normal function. The left ventricle has no regional wall motion abnormalities. The left ventricular internal cavity size was normal in size. There is  no left ventricular hypertrophy. Left ventricular diastolic parameters were normal. Right Ventricle: The right ventricular size is normal. No increase in right ventricular wall thickness. Right ventricular systolic function is normal. There is normal pulmonary artery systolic pressure. The tricuspid regurgitant velocity is 2.16 m/s, and  with an assumed right atrial pressure of 8 mmHg, the estimated right ventricular systolic pressure is 19.5 mmHg. Left Atrium: Left atrial size was normal in size. Right Atrium: Right atrial size was normal in size. Pericardium: There is no evidence of pericardial effusion. Presence of epicardial fat layer. Mitral Valve: The mitral valve is grossly normal. Trivial mitral valve regurgitation. MV peak gradient, 2.4 mmHg. The mean mitral valve gradient is 1.0 mmHg. Tricuspid Valve: The tricuspid valve is grossly normal. Tricuspid valve regurgitation is trivial. Aortic Valve: The aortic valve is tricuspid. There is mild aortic valve annular calcification.  Aortic valve regurgitation is not visualized. Aortic valve mean gradient measures 3.0 mmHg. Aortic valve peak gradient measures 5.7 mmHg. Aortic valve area, by  VTI measures 2.74 cm. Pulmonic Valve: The pulmonic valve was grossly normal. Pulmonic valve regurgitation is trivial. Aorta: The aortic root is normal in size and structure. IAS/Shunts: No atrial level shunt detected by color flow Doppler.  LEFT VENTRICLE PLAX 2D LVIDd:         4.90 cm   Diastology LVIDs:         3.15  cm   LV e' medial:    7.18 cm/s LV PW:         0.90 cm   LV E/e' medial:  9.0 LV IVS:        1.00 cm   LV e' lateral:   8.38 cm/s LVOT diam:     2.00 cm   LV E/e' lateral: 7.7 LV SV:         55 LV SV Index:   29 LVOT Area:     3.14 cm  LEFT ATRIUM             Index        RIGHT ATRIUM          Index LA diam:        3.60 cm 1.89 cm/m   RA Area:     9.95 cm LA Vol (A2C):   30.2 ml 15.85 ml/m  RA Volume:   18.30 ml 9.60 ml/m LA Vol (A4C):   22.1 ml 11.60 ml/m LA Biplane Vol: 26.6 ml 13.96 ml/m  AORTIC VALVE                    PULMONIC VALVE AV Area (Vmax):    2.21 cm     PV Vmax:       0.81 m/s AV Area (Vmean):   2.45 cm     PV Peak grad:  2.6 mmHg AV Area (VTI):     2.74 cm AV Vmax:           119.00 cm/s AV Vmean:          74.800 cm/s AV VTI:            0.202 m AV Peak Grad:      5.7 mmHg AV Mean Grad:      3.0 mmHg LVOT Vmax:         83.60 cm/s LVOT Vmean:        58.400 cm/s LVOT VTI:          0.176 m LVOT/AV VTI ratio: 0.87  AORTA Ao Root diam: 2.90 cm MITRAL VALVE               TRICUSPID VALVE MV Area (PHT): 4.08 cm    TR Peak grad:   18.7 mmHg MV Area VTI:   2.64 cm    TR Vmax:        216.00 cm/s MV Peak grad:  2.4 mmHg MV Mean grad:  1.0 mmHg    SHUNTS MV Vmax:       0.78 m/s    Systemic VTI:  0.18 m MV Vmean:      48.9 cm/s   Systemic Diam: 2.00 cm MV Decel Time: 186 msec MV E velocity: 64.70 cm/s MV A velocity: 58.70 cm/s MV E/A ratio:  1.10 Rozann Lesches MD Electronically signed by Rozann Lesches MD Signature Date/Time: 11/12/2021/12:33:54 PM    Final    DG HIP UNILAT  WITH PELVIS 2-3 VIEWS RIGHT  Result Date: 11/12/2021 CLINICAL DATA:  Fall, right hip pain EXAM: DG HIP (WITH OR WITHOUT PELVIS) 2-3V RIGHT COMPARISON:  CT 11/11/2021 FINDINGS: Subtle linear lucency within the right inferior pubic ramus, indeterminate for fracture. Osseous structures are otherwise intact. Hip joint intact without dislocation. Right hip joint space is maintained. There is soft tissue swelling along the lateral  aspect of the hip at the level of the greater trochanter. IMPRESSION: 1. Subtle linear lucency within the right inferior pubic ramus, indeterminate for fracture. No fracture was present at this location on CT performed 1 day ago. 2. Soft tissue swelling along the lateral aspect of the hip. Electronically Signed   By: Davina Poke D.O.   On: 11/12/2021 12:37   DG SMALL BOWEL W DOUBLE CM (HD)  Result Date: 11/14/2021 CLINICAL DATA:  Abdominal pain. History of colon resection. Vomiting. Rule out bowel obstruction. EXAM: SMALL BOWEL SERIES COMPARISON:  CT abdomen pelvis 11/11/2021 TECHNIQUE: Following ingestion of thin barium, serial small bowel images were obtained FLUOROSCOPY TIME:  Fluoroscopy Time:  0 Radiation Exposure Index (if provided by the fluoroscopic device): Number of Acquired Spot Images: 0 FINDINGS: Preliminary KUB demonstrates dilated bowel loops in the right abdomen and the central abdomen. These appear to be dilated small bowel loops. There are numerous surgical staple lines and clips in the abdomen. Review of the CT reveals apparent colectomy in the proximal sigmoid region. Surgical clips in the gallbladder fossa. Patient ingested barium and serial images were obtained to 180 minutes. Additional imaging recommended as barium has not reached the rectosigmoid at this time. Stomach grossly normal. Stomach empties into the duodenum. A small diverticulum in the third portion the duodenum. The jejunum fills with contrast and is not significantly dilated. On the delayed image at 180 minutes, there is barium in the right upper quadrant which appears to be mildly dilated ileum. No bowel wall edema or mass. IMPRESSION: 1. History of colectomy at the level the proximal sigmoid colon. 2. Barium is slow to transit through the small bowel. Barium filled loops in the right upper quadrant likely dilated ileum related to small-bowel obstruction. This dictation includes images out to 180 minutes. Additional  KUB has been requested in 3 hours. Electronically Signed   By: Franchot Gallo M.D.   On: 11/14/2021 16:09   CT Maxillofacial Wo Contrast  Result Date: 11/11/2021 CLINICAL DATA:  Right-sided facial swelling after fall this morning. EXAM: CT MAXILLOFACIAL WITHOUT CONTRAST TECHNIQUE: Multidetector CT imaging of the maxillofacial structures was performed. Multiplanar CT image reconstructions were also generated. COMPARISON:  CT head dated April 13, 2016. CT maxillofacial dated August 11, 2009. FINDINGS: Osseous: No fracture or mandibular dislocation. No destructive process. Orbits: Negative. No traumatic or inflammatory finding. Sinuses: Clear. Soft tissues: Mild right facial soft tissue swelling. Limited intracranial: No significant or unexpected finding. IMPRESSION: 1. No acute maxillofacial fracture. 2. Mild right facial soft tissue swelling. Electronically Signed   By: Titus Dubin M.D.   On: 11/11/2021 16:08    Kathie Dike, MD  [facility]  If 7PM-7AM, please contact night-coverage [email or web address]  11/17/2021, 8:28 PM   LOS: 5 days

## 2021-11-17 NOTE — Progress Notes (Addendum)
    Subjective: Feeling full with small amount of food. She did eat breakfast this morning, small amount. Had multiple loose movements this morning and felt better. Less distension this morning and feels stomach is softer. Eager to go home tomorrow if possible.   Objective: Vital signs in last 24 hours: Temp:  [97.6 F (36.4 C)-98.4 F (36.9 C)] 98.1 F (36.7 C) (11/21 0556) Pulse Rate:  [79-86] 79 (11/21 0807) Resp:  [16-18] 18 (11/21 0556) BP: (87-119)/(42-74) 106/60 (11/21 0807) SpO2:  [92 %-95 %] 95 % (11/21 0556) Last BM Date: 11/16/21 General:   Alert and oriented, pleasant Head:  Normocephalic and atraumatic. Eyes:  No icterus, sclera clear. Conjuctiva pink.  Abdomen:  Bowel sounds present, soft, TTP midline abdomen with ventral hernia, no rebound or guarding Neurologic:  Alert and  oriented x4 Psych:  Alert and cooperative. Normal mood and affect.  Intake/Output from previous day: 11/20 0701 - 11/21 0700 In: 1895.1 [P.O.:840; I.V.:955.1; IV Piggyback:100] Out: -  Intake/Output this shift: No intake/output data recorded.  Lab Results: Recent Labs    11/15/21 0525  WBC 6.7  HGB 13.4  HCT 39.7  PLT 186   BMET Recent Labs    11/15/21 0525 11/16/21 0353 11/17/21 0315  NA 141 140 138  K 3.3* 4.2 4.1  CL 109 110 112*  CO2 25 20* 20*  GLUCOSE 90 98 90  BUN 7 15 24*  CREATININE 0.63 0.66 0.73  CALCIUM 8.8* 9.1 8.5*     Studies/Results: DG Abd 1 View  Result Date: 11/15/2021 CLINICAL DATA:  Small bowel study done yesterday.  Pain. EXAM: ABDOMEN - 1 VIEW COMPARISON:  November 14, 2021 FINDINGS: The contrast has exited the small bowel and is now seen throughout the length of the colon reaching the level the rectum. Air-filled prominent loops of bowel remain. IMPRESSION: The contrast now traverses the colon to the level the rectum. Air-filled prominent loops of primarily colon remain. Electronically Signed   By: Dorise Bullion III M.D.   On: 11/15/2021 19:12     Assessment: 60 year old female with multiple medical issues, presenting with nausea, vomiting, diarrhea, and abdominal pain. CT this admission with mildly dilated colon with air-fluid levels suggestive of diarrheal illness, partial or developing bowel obstruction not excluded, and possible proctitis. Small bowel follow through with slow transit throughout small bowel and barium filled loops in RUQ likely related to SBO. Suspected GI pseudo-obstruction, possibly gastroparesis, and small bowel dysmotility. Unable to rule out stricture in light of prior multiple surgeries. Started on erythromycin yesterday as unable to take Reglan as she reports tremors with this.   Clinically, she has had some improvement on erythromycin. Able to tolerate small amount of breakfast today, and she feels her abdominal distension is improved. Eager to go home tomorrow. We discussed small, frequent meals throughout the day and continuing supportive measures.     Plan: PPI BID Complete course of Xifaxan TID for 14 days Continue Senna BID Continue Carafate Continue erythromycin 125 mg TID before meals Hopeful discharge on 11/22   [name], PhD, ANP-BC [facility]    LOS: 5 days    11/17/2021, 9:37 AM

## 2021-11-17 NOTE — Progress Notes (Signed)
Patient continuing to decline oral medications due to ongoing unresolved abdominal pain and nausea. Was receptive to Carafate and taking IV medications. Primary team made aware during the day time.

## 2021-11-18 DIAGNOSIS — R55 Syncope and collapse: Secondary | ICD-10-CM

## 2021-11-18 DIAGNOSIS — E860 Dehydration: Secondary | ICD-10-CM | POA: Diagnosis not present

## 2021-11-18 DIAGNOSIS — R1084 Generalized abdominal pain: Secondary | ICD-10-CM

## 2021-11-18 DIAGNOSIS — K9189 Other postprocedural complications and disorders of digestive system: Secondary | ICD-10-CM | POA: Diagnosis not present

## 2021-11-18 DIAGNOSIS — G894 Chronic pain syndrome: Secondary | ICD-10-CM | POA: Diagnosis not present

## 2021-11-18 LAB — GLUCOSE, CAPILLARY
Glucose-Capillary: 90 mg/dL (ref 70–99)
Glucose-Capillary: 91 mg/dL (ref 70–99)
Glucose-Capillary: 98 mg/dL (ref 70–99)

## 2021-11-18 MED ORDER — SUCRALFATE 1 G PO TABS: 1.0000 g | ORAL_TABLET | Freq: Three times a day (TID) | ORAL | 0 refills | Status: DC

## 2021-11-18 MED ORDER — PROCHLORPERAZINE EDISYLATE 10 MG/2ML IJ SOLN
10.0000 mg | Freq: Once | INTRAMUSCULAR | Status: DC
  Filled 2021-11-18: qty 2

## 2021-11-18 MED ORDER — OMEPRAZOLE 40 MG PO CPDR: 40.0000 mg | DELAYED_RELEASE_CAPSULE | Freq: Two times a day (BID) | ORAL | 3 refills | Status: DC

## 2021-11-18 MED ORDER — RIFAXIMIN 550 MG PO TABS: 550.0000 mg | ORAL_TABLET | Freq: Three times a day (TID) | ORAL | 0 refills | Status: DC

## 2021-11-18 MED ORDER — PREDNISONE 10 MG PO TABS: ORAL_TABLET | ORAL | 0 refills | Status: DC

## 2021-11-18 MED ORDER — ERYTHROMYCIN 250 MG PO TBEC: 250.0000 mg | DELAYED_RELEASE_TABLET | Freq: Three times a day (TID) | ORAL | 0 refills | Status: DC

## 2021-11-18 MED ORDER — MOTEGRITY 1 MG PO TABS: 1.0000 mg | ORAL_TABLET | Freq: Every day | ORAL | 0 refills | Status: DC

## 2021-11-18 NOTE — Discharge Summary (Signed)
Physician Discharge Summary  Caylynn Minchew TMH:[id number] DOB: [birth year] DOA: 11/11/2021  PCP: Fayrene Helper, MD  Admit date: 11/11/2021 Discharge date: 11/18/2021  Admitted From: home Disposition:  home  Recommendations for Outpatient Follow-up:  Follow up with gastroenterology in the next few weeks Follow up with surgery team at [facility] as scheduled for potential hernia repair Follow up with Pain management, Dr. Merlene Laughter as scheduled  Home Health: Equipment/Devices:  Discharge Condition:stable CODE STATUS:full code Diet recommendation: full liquids to advance to soft foods slowly. Patient should have 4-5 small meals daily  Brief/Interim Summary: 60 year old female with a history of fibromyalgia, ovarian cancer, left breast cancer, multiple abdominal surgeries, IVC filter March 2017, pulmonary embolus, anxiety, chronic pain syndrome, fractured IVC, hypertension, hyperlipidemia presenting with nausea, vomiting, diarrhea, and abdominal pain that began on 11/09/2021.  The patient recently was discharged from [facility] after a stay from 10/25/2021 to 10/29/2021 when she was treated for presumptive partial small bowel obstruction.  The patient was treated conservatively and improved with medical therapy.  A CT of the abdomen and pelvis during that admission showed multiple dilated small bowel loops in the right lower quadrant, but also showed stool and gas in the colon.  The patient stated that she was feeling fine when she went home until her symptoms began on 11/09/2021.  The patient states that she eats a lot of salads.  She began having worsening abdominal pain and diarrhea and vomiting.  There is no hematochezia or melena.  The patient went to see her GI physician on 11/11/2021, and she was directed to the emergency department for further evaluation.  The patient denies any fever, chills, headache, neck pain, shortness breath, cough, hemoptysis. In addition, the patient states that  she has been feeling dizzy for the past 2 days.  The patient was standing up folding close when she had a syncopal episode on 11/11/2021.  This was preceded by worsening dizziness.  Discharge Diagnoses:  Principal Problem:   Diarrhea Active Problems:   Chronic pain syndrome   Syncope   Abdominal pain   Essential hypertension   History of DVT (deep vein thrombosis)   History of pulmonary embolus (PE)   Intractable vomiting   Dilation of small bowel anastomosis   Abnormal CT scan, small bowel  Intractable nausea/vomiting/abdominal pain/Diarrhea -Doubt small bowel obstruction as the patient is having bowel movements and passing flatus -11/17--had one large BM -11/18--had BM -11/15 CT abd--mildly dilated colon in the right abdomen with air-fluid levels suggesting diarrheal illness.  Mucosal enhancement within the rectum.  No dilated small bowel loops. -GI consult appreciated>>concerned about SB stricture -11/18--planning SB follow through study -11/18--SBFT>>Barium is slow to transit through the small bowel. Barium filled loops in the right upper quadrant likely dilated ileum related to small-bowel obstruction. No obvious stricture on this imaging -Stool pathogen panel--neg -Stool for C. Difficile--negative -Clear liquid diet for now>>full liquids>>heart healthy diet on 11/18 -PPI twice daily -carafate started -she was treated with IV dilaudid as needed for pain -suspect she has narcotic bowel syndrome -GI recommends that patient take prucalopride after discharge -Due to persistent nausea and vomiting and concerns for possible gastroparesis, started on IV erythromycin since she has not tolerated metoclopramide -Also started on Xifaxan x 14 days per GI for possible SIBO -Appears to be slowly improving.   -plans are to continue erythromycin for 2 weeks after discharge until prucalopride can be obtained. Continue on PPI and carafate   Syncope -Remain on telemetry--no concerning  dysrhythmia -Echocardiogram--EF 60-65%,  no WMA, trivial MR -Secondary to volume depletion and vasovagal response -Treated with IV fluids   Chronic pain syndrome -PDMP reviewed -She is chronically on hydromorphone 4 mg, last filled 10/13/2021, #90 -she is followed in Dr. Freddie Apley office   Anxiety -Continue home dose alprazolam 1 mg -Last refill 10/13/2021, #60 -Continue Cymbalta   DVT/PE -Continue rivaroxaban   Hypothyroidism -Continue Synthroid    Diabetes mellitus type 2 -NovoLog sliding scale>>discontinue -She takes Ozempic every Monday and metformin for weight loss -A1C--5.1 -would likely be beneficial to hold these meds for now since she is dealing with nausea and decreased po intake   Hypertension -Continue metoprolol at lower dose   Dysuria -history of interstitial cystitis -UA not suggestive UTI -d/c ceftriaxone -pyridium -restarted premarin cream   Hypokalemia -replete -Magnesium also replaced   Right Hip Pain -overall improved; able to ambulate without difficulty -11/16 xray hip--Subtle linear lucency within the right inferior pubic ramus, indeterminate for fracture -11/15 CT abd/pelvis--no pelvic fracture -patient is able to ambulate without significant pain in hip, so doubt any real hip fracture pathology     Right sided low back pain -patient is concerned that she may have sciatic pain which is worse after fall prior to admission -positive straight leg raise on the right -reports that toradol has helped in the past -will give a dose of toradol and start on course of steroids -Appears to be slowly improving -transitioned to prednisone on discharge   Obesity -BMI 32.27   Discharge Instructions  Discharge Instructions     Diet - low sodium heart healthy   Complete by: As directed    Increase activity slowly   Complete by: As directed       Allergies as of 11/18/2021       Reactions   Nitrofurantoin Hives   Crestor [rosuvastatin  Calcium] Other (See Comments)   Generalized cramps   Bisacodyl Other (See Comments)   Makes patient feel like she is having cramps Makes patient feel like she is having cramps   Clarithromycin Hives, Other (See Comments)   Other reaction(s): Other (See Comments) Cluster migraines Cluster migraines   Clarithromycin Other (See Comments)   Cluster migraines   Clindamycin Hcl Hives   Codeine Itching   Iron Sucrose Other (See Comments)   Flushing- required benadryl and solu medrol   Monosodium Glutamate Other (See Comments)   Cluster migraines Cluster migraines   Scopolamine Hbr Other (See Comments)   Cluster migraines, impaired vision        Medication List     STOP taking these medications    Artificial Tears 0.1-0.3 % Soln Generic drug: Dextran 70-Hypromellose   Calcium-Vitamin D 600-125 MG-UNIT Tabs   cephALEXin 500 MG capsule Commonly known as: KEFLEX   OZEMPIC (2 MG/DOSE) [place]       TAKE these medications    ALPRAZolam 1 MG tablet Commonly known as: XANAX Take 1 tablet (1 mg total) by mouth 2 (two) times daily.   aspirin 81 MG chewable tablet Chew 81 mg by mouth daily.   butalbital-acetaminophen-caffeine 50-325-40 MG tablet Commonly known as: FIORICET Take 1 tablet by mouth as directed.   Cholecalciferol 1.25 MG (50000 UT) capsule Take 50,000 Units by mouth once a week.   conjugated estrogens 0.625 MG/GM vaginal cream Commonly known as: PREMARIN Place fingertip amount intravaginally 2 to 3 times per week What changed:  how much to take how to take this when to take this   cyanocobalamin 1000 MCG/ML injection Commonly known  as: (VITAMIN B-12) Inject 1 mL (1,000 mcg total) into the muscle every 30 (thirty) days.   cyclobenzaprine 10 MG tablet Commonly known as: FLEXERIL TAKE 1 TABLET BY MOUTH TWICE DAILY AS NEEDED FOR MUSCLE SPASMS What changed: Another medication with the same name was removed. Continue taking this medication, and follow the  directions you see here.   cycloSPORINE 0.05 % ophthalmic emulsion Commonly known as: RESTASIS Place 1 drop into both eyes 2 (two) times daily as needed (dry eyes).   DULoxetine 60 MG capsule Commonly known as: CYMBALTA Take 60 mg by mouth daily.   Erythromycin 250 MG Tbec Take 250 mg by mouth 3 (three) times daily before meals.   ezetimibe 10 MG tablet Commonly known as: Zetia Take 1 tablet (10 mg total) by mouth daily.   fluticasone 50 MCG/ACT nasal spray Commonly known as: FLONASE Place 2 sprays into both nostrils daily. What changed:  when to take this reasons to take this   HYDROmorphone 4 MG tablet Commonly known as: DILAUDID Take 4 mg by mouth in the morning, at noon, and at bedtime.   ICaps Areds 2 Caps Take 1 capsule by mouth at bedtime.   levothyroxine 125 MCG tablet Commonly known as: SYNTHROID TAKE 1 TABLET(125 MCG) BY MOUTH DAILY BEFORE BREAKFAST What changed: Another medication with the same name was removed. Continue taking this medication, and follow the directions you see here.   magnesium oxide 400 (240 Mg) MG tablet Commonly known as: MAG-OX Take 1 tablet (400 mg total) by mouth 2 (two) times daily. Please make overdue appt with Dr. Caryl Comes before anymore refills. Thank you 1st attempt What changed: when to take this   meclizine 25 MG tablet Commonly known as: ANTIVERT Take 1 tablet (25 mg total) by mouth 3 (three) times daily as needed for dizziness.   metoprolol tartrate 25 MG tablet Commonly known as: LOPRESSOR Take 1 tablet (25 mg total) by mouth 2 (two) times daily. What changed:  when to take this reasons to take this   Motegrity 1 MG Tabs Generic drug: Prucalopride Succinate Take 1 mg by mouth daily at 2 PM.   omeprazole 40 MG capsule Commonly known as: PRILOSEC Take 1 capsule (40 mg total) by mouth in the morning and at bedtime. What changed:  how much to take when to take this   pravastatin 40 MG tablet Commonly known as:  PRAVACHOL TAKE 1 TABLET(40 MG) BY MOUTH DAILY What changed:  how much to take how to take this when to take this   predniSONE 10 MG tablet Commonly known as: DELTASONE Take 40mg  po daily for 2 days then 30mg  daily for 2 days then 20mg  daily for 2 days then 10mg  daily for 2 days then stop   promethazine 25 MG tablet Commonly known as: PHENERGAN Take by mouth.   rifaximin 550 MG Tabs tablet Commonly known as: XIFAXAN Take 1 tablet (550 mg total) by mouth 3 (three) times daily.   senna-docusate 8.6-50 MG tablet Commonly known as: Senokot-S Take 2 tablets by mouth 2 (two) times daily. constipation   sucralfate 1 g tablet Commonly known as: CARAFATE Take 1 tablet (1 g total) by mouth 4 (four) times daily -  with meals and at bedtime.   vitamin C 500 MG tablet Commonly known as: ASCORBIC ACID Take 500 mg by mouth at bedtime.   VITAMINS FOR HAIR PO Take 1 capsule by mouth daily.   Xarelto 20 MG Tabs tablet Generic drug: rivaroxaban TAKE 1 TABLET(20  MG) BY MOUTH DAILY What changed: See the new instructions.        Follow-up Information     Rehman, Mechele Dawley, MD Follow up.   Specialty: Gastroenterology Why: Please call for any issues with obtaining motegrity follow up in the next few weeks. Office will contact you with appointment Contact information: [place], [address] [place] [address] [phone]                Allergies  Allergen Reactions   Nitrofurantoin Hives   Crestor [Rosuvastatin Calcium] Other (See Comments)    Generalized cramps   Bisacodyl Other (See Comments)    Makes patient feel like she is having cramps Makes patient feel like she is having cramps   Clarithromycin Hives and Other (See Comments)    Other reaction(s): Other (See Comments) Cluster migraines  Cluster migraines   Clarithromycin Other (See Comments)    Cluster migraines    Clindamycin Hcl Hives   Codeine Itching   Iron Sucrose Other (See Comments)    Flushing-  required benadryl and solu medrol   Monosodium Glutamate Other (See Comments)    Cluster migraines  Cluster migraines   Scopolamine Hbr Other (See Comments)    Cluster migraines, impaired vision    Consultations: Gastroenterology   Procedures/Studies: DG Abd 1 View  Result Date: 11/15/2021 CLINICAL DATA:  Small bowel study done yesterday.  Pain. EXAM: ABDOMEN - 1 VIEW COMPARISON:  November 14, 2021 FINDINGS: The contrast has exited the small bowel and is now seen throughout the length of the colon reaching the level the rectum. Air-filled prominent loops of bowel remain. IMPRESSION: The contrast now traverses the colon to the level the rectum. Air-filled prominent loops of primarily colon remain. Electronically Signed   By: Dorise Bullion III M.D.   On: 11/15/2021 19:12   CT ABDOMEN PELVIS W CONTRAST  Result Date: 11/11/2021 CLINICAL DATA:  Nausea, vomiting, dehydration. History of small-bowel obstruction and prior bowel resections. EXAM: CT ABDOMEN AND PELVIS WITH CONTRAST TECHNIQUE: Multidetector CT imaging of the abdomen and pelvis was performed using the standard protocol following bolus administration of intravenous contrast. CONTRAST:  180mL OMNIPAQUE IOHEXOL 300 MG/ML  SOLN COMPARISON:  06/12/2019 FINDINGS: Lower chest: Included lung bases are clear. Heart size is normal. Right breast prosthesis. Hepatobiliary: No focal liver abnormality is seen. Status post cholecystectomy. Similar degree of postoperative intra and extrahepatic biliary dilatation. Pancreas: Unremarkable. No pancreatic ductal dilatation or surrounding inflammatory changes. Spleen: Normal in size without focal abnormality. Adrenals/Urinary Tract: Unremarkable adrenal glands. Kidneys enhance symmetrically without focal lesion, stone, or hydronephrosis. Ureters are nondilated. Urinary bladder appears unremarkable for the degree of distension. Stomach/Bowel: Stomach within normal limits. Redemonstrated postoperative changes  related to prior large and small bowel resections. No dilated loops of small bowel. Mildly dilated colon within the right abdomen with air-fluid levels. Mucosal hyperenhancement within the rectum without focal thickening. Vascular/Lymphatic: No significant vascular findings are present. No enlarged abdominal or pelvic lymph nodes. Reproductive: Status post hysterectomy. No adnexal masses. Other: Large broad-based ventral midline abdominal wall hernia with postsurgical changes along the abdominal wall. No ascites. No pneumoperitoneum. Musculoskeletal: No acute or significant osseous findings. IMPRESSION: 1. Postsurgical changes to the bowel. Mildly dilated colon within the right abdomen with air-fluid levels suggestive of a diarrheal illness. Partial or developing bowel obstruction is not entirely excluded, particularly given the degree of postsurgical changes along the anterior abdominal wall. Close clinical follow-up recommended. 2. Mucosal hyperenhancement within the rectum without focal  thickening, which may represent proctitis. Electronically Signed   By: Davina Poke D.O.   On: 11/11/2021 16:12   DG ABD ACUTE 2+V W 1V CHEST  Result Date: 11/12/2021 CLINICAL DATA:  Abdominal pain, weakness, chest pain EXAM: DG ABDOMEN ACUTE WITH 1 VIEW CHEST COMPARISON:  CT 11/11/2021, chest x-ray 04/13/2016 FINDINGS: Similar appearance of mildly dilated colon, predominantly in the right hemiabdomen. There is no evidence of free intraperitoneal air. Numerous surgical clips in the low abdomen and left upper quadrant. No radiopaque calculi or other significant radiographic abnormality is seen. Heart size and mediastinal contours are within normal limits. Chronic scarring in the left mid lung. Insert clear IMPRESSION: 1. Similar appearance of mildly dilated colon, predominantly in the right hemiabdomen. 2. No acute cardiopulmonary disease. Electronically Signed   By: Davina Poke D.O.   On: 11/12/2021 13:58    ECHOCARDIOGRAM COMPLETE  Result Date: 11/12/2021    ECHOCARDIOGRAM REPORT   Patient Name:   ELOWYN RAUPP Date of Exam: 11/12/2021 Medical Rec #:  [id number]             Height:       64.0 in Accession #:    [id number]            Weight:       188.0 lb Date of Birth:  12/13/1961             BSA:          1.906 m Patient Age:    59 years              BP:           90/64 mmHg Patient Gender: F                     HR:           73 bpm. Exam Location:  [facility] Procedure: 2D Echo, Cardiac Doppler and Color Doppler Indications:    Syncope  History:        Patient has prior history of Echocardiogram examinations, most                 recent 09/17/2020. Signs/Symptoms:Chest Pain and Syncope; Risk                 Factors:Hypertension and Dyslipidemia.  Sonographer:    Wenda Low Referring Phys: [id number] DAVID TAT  Sonographer Comments: Suboptimal apical window and patient is morbidly obese. IMPRESSIONS  1. Left ventricular ejection fraction, by estimation, is 60 to 65%. The left ventricle has normal function. The left ventricle has no regional wall motion abnormalities. Left ventricular diastolic parameters were normal.  2. Right ventricular systolic function is normal. The right ventricular size is normal. There is normal pulmonary artery systolic pressure. The estimated right ventricular systolic pressure is 31.4 mmHg.  3. The mitral valve is grossly normal. Trivial mitral valve regurgitation.  4. The aortic valve is tricuspid. Aortic valve regurgitation is not visualized. Aortic valve mean gradient measures 3.0 mmHg. Comparison(s): No significant change from prior study. Prior images reviewed side by side. FINDINGS  Left Ventricle: Left ventricular ejection fraction, by estimation, is 60 to 65%. The left ventricle has normal function. The left ventricle has no regional wall motion abnormalities. The left ventricular internal cavity size was normal in size. There is  no left ventricular hypertrophy. Left  ventricular diastolic parameters were normal. Right Ventricle: The right ventricular size is normal. No increase in right ventricular wall thickness.  Right ventricular systolic function is normal. There is normal pulmonary artery systolic pressure. The tricuspid regurgitant velocity is 2.16 m/s, and  with an assumed right atrial pressure of 8 mmHg, the estimated right ventricular systolic pressure is 33.8 mmHg. Left Atrium: Left atrial size was normal in size. Right Atrium: Right atrial size was normal in size. Pericardium: There is no evidence of pericardial effusion. Presence of epicardial fat layer. Mitral Valve: The mitral valve is grossly normal. Trivial mitral valve regurgitation. MV peak gradient, 2.4 mmHg. The mean mitral valve gradient is 1.0 mmHg. Tricuspid Valve: The tricuspid valve is grossly normal. Tricuspid valve regurgitation is trivial. Aortic Valve: The aortic valve is tricuspid. There is mild aortic valve annular calcification. Aortic valve regurgitation is not visualized. Aortic valve mean gradient measures 3.0 mmHg. Aortic valve peak gradient measures 5.7 mmHg. Aortic valve area, by  VTI measures 2.74 cm. Pulmonic Valve: The pulmonic valve was grossly normal. Pulmonic valve regurgitation is trivial. Aorta: The aortic root is normal in size and structure. IAS/Shunts: No atrial level shunt detected by color flow Doppler.  LEFT VENTRICLE PLAX 2D LVIDd:         4.90 cm   Diastology LVIDs:         3.15 cm   LV e' medial:    7.18 cm/s LV PW:         0.90 cm   LV E/e' medial:  9.0 LV IVS:        1.00 cm   LV e' lateral:   8.38 cm/s LVOT diam:     2.00 cm   LV E/e' lateral: 7.7 LV SV:         55 LV SV Index:   29 LVOT Area:     3.14 cm  LEFT ATRIUM             Index        RIGHT ATRIUM          Index LA diam:        3.60 cm 1.89 cm/m   RA Area:     9.95 cm LA Vol (A2C):   30.2 ml 15.85 ml/m  RA Volume:   18.30 ml 9.60 ml/m LA Vol (A4C):   22.1 ml 11.60 ml/m LA Biplane Vol: 26.6 ml 13.96 ml/m   AORTIC VALVE                    PULMONIC VALVE AV Area (Vmax):    2.21 cm     PV Vmax:       0.81 m/s AV Area (Vmean):   2.45 cm     PV Peak grad:  2.6 mmHg AV Area (VTI):     2.74 cm AV Vmax:           119.00 cm/s AV Vmean:          74.800 cm/s AV VTI:            0.202 m AV Peak Grad:      5.7 mmHg AV Mean Grad:      3.0 mmHg LVOT Vmax:         83.60 cm/s LVOT Vmean:        58.400 cm/s LVOT VTI:          0.176 m LVOT/AV VTI ratio: 0.87  AORTA Ao Root diam: 2.90 cm MITRAL VALVE               TRICUSPID VALVE MV Area (PHT): 4.08 cm  TR Peak grad:   18.7 mmHg MV Area VTI:   2.64 cm    TR Vmax:        216.00 cm/s MV Peak grad:  2.4 mmHg MV Mean grad:  1.0 mmHg    SHUNTS MV Vmax:       0.78 m/s    Systemic VTI:  0.18 m MV Vmean:      48.9 cm/s   Systemic Diam: 2.00 cm MV Decel Time: 186 msec MV E velocity: 64.70 cm/s MV A velocity: 58.70 cm/s MV E/A ratio:  1.10 Rozann Lesches MD Electronically signed by Rozann Lesches MD Signature Date/Time: 11/12/2021/12:33:54 PM    Final    DG HIP UNILAT WITH PELVIS 2-3 VIEWS RIGHT  Result Date: 11/12/2021 CLINICAL DATA:  Fall, right hip pain EXAM: DG HIP (WITH OR WITHOUT PELVIS) 2-3V RIGHT COMPARISON:  CT 11/11/2021 FINDINGS: Subtle linear lucency within the right inferior pubic ramus, indeterminate for fracture. Osseous structures are otherwise intact. Hip joint intact without dislocation. Right hip joint space is maintained. There is soft tissue swelling along the lateral aspect of the hip at the level of the greater trochanter. IMPRESSION: 1. Subtle linear lucency within the right inferior pubic ramus, indeterminate for fracture. No fracture was present at this location on CT performed 1 day ago. 2. Soft tissue swelling along the lateral aspect of the hip. Electronically Signed   By: Davina Poke D.O.   On: 11/12/2021 12:37   DG SMALL BOWEL W DOUBLE CM (HD)  Result Date: 11/14/2021 CLINICAL DATA:  Abdominal pain. History of colon resection. Vomiting. Rule  out bowel obstruction. EXAM: SMALL BOWEL SERIES COMPARISON:  CT abdomen pelvis 11/11/2021 TECHNIQUE: Following ingestion of thin barium, serial small bowel images were obtained FLUOROSCOPY TIME:  Fluoroscopy Time:  0 Radiation Exposure Index (if provided by the fluoroscopic device): Number of Acquired Spot Images: 0 FINDINGS: Preliminary KUB demonstrates dilated bowel loops in the right abdomen and the central abdomen. These appear to be dilated small bowel loops. There are numerous surgical staple lines and clips in the abdomen. Review of the CT reveals apparent colectomy in the proximal sigmoid region. Surgical clips in the gallbladder fossa. Patient ingested barium and serial images were obtained to 180 minutes. Additional imaging recommended as barium has not reached the rectosigmoid at this time. Stomach grossly normal. Stomach empties into the duodenum. A small diverticulum in the third portion the duodenum. The jejunum fills with contrast and is not significantly dilated. On the delayed image at 180 minutes, there is barium in the right upper quadrant which appears to be mildly dilated ileum. No bowel wall edema or mass. IMPRESSION: 1. History of colectomy at the level the proximal sigmoid colon. 2. Barium is slow to transit through the small bowel. Barium filled loops in the right upper quadrant likely dilated ileum related to small-bowel obstruction. This dictation includes images out to 180 minutes. Additional KUB has been requested in 3 hours. Electronically Signed   By: Franchot Gallo M.D.   On: 11/14/2021 16:09   CT Maxillofacial Wo Contrast  Result Date: 11/11/2021 CLINICAL DATA:  Right-sided facial swelling after fall this morning. EXAM: CT MAXILLOFACIAL WITHOUT CONTRAST TECHNIQUE: Multidetector CT imaging of the maxillofacial structures was performed. Multiplanar CT image reconstructions were also generated. COMPARISON:  CT head dated April 13, 2016. CT maxillofacial dated August 11, 2009.  FINDINGS: Osseous: No fracture or mandibular dislocation. No destructive process. Orbits: Negative. No traumatic or inflammatory finding. Sinuses: Clear. Soft tissues: Mild right facial  soft tissue swelling. Limited intracranial: No significant or unexpected finding. IMPRESSION: 1. No acute maxillofacial fracture. 2. Mild right facial soft tissue swelling. Electronically Signed   By: Titus Dubin M.D.   On: 11/11/2021 16:08      Subjective: She is feeling better today. Po intake improving. She is having bowel movements  Discharge Exam: Vitals:   11/18/21 0525 11/18/21 0735 11/18/21 1018 11/18/21 1424  BP: (!) 95/56 119/66 121/62 117/77  Pulse: 73 74 75 87  Resp: 20 20  17   Temp: 97.9 F (36.6 C) 98 F (36.7 C)  98 F (36.7 C)  TempSrc: Oral Oral    SpO2: 92% 96%  94%  Weight:      Height:        General: Pt is alert, awake, not in acute distress Cardiovascular: RRR, S1/S2 +, no rubs, no gallops Respiratory: CTA bilaterally, no wheezing, no rhonchi Abdominal: Soft, diffusely tender, ND, bowel sounds +, ventral hernia is present Extremities: no edema, no cyanosis    The results of significant diagnostics from this hospitalization (including imaging, microbiology, ancillary and laboratory) are listed below for reference.     Microbiology: Recent Results (from the past 240 hour(s))  Resp Panel by RT-PCR (Flu A&B, Covid) Nasopharyngeal Swab     Status: None   Collection Time: 11/11/21  4:52 PM   Specimen: Nasopharyngeal Swab; Nasopharyngeal(NP) swabs in vial transport medium  Result Value Ref Range Status   SARS Coronavirus 2 by RT PCR NEGATIVE NEGATIVE Final    Comment: (NOTE) SARS-CoV-2 target nucleic acids are NOT DETECTED.  The SARS-CoV-2 RNA is generally detectable in upper respiratory specimens during the acute phase of infection. The lowest concentration of SARS-CoV-2 viral copies this assay can detect is 138 copies/mL. A negative result does not preclude  SARS-Cov-2 infection and should not be used as the sole basis for treatment or other patient management decisions. A negative result may occur with  improper specimen collection/handling, submission of specimen other than nasopharyngeal swab, presence of viral mutation(s) within the areas targeted by this assay, and inadequate number of viral copies(<138 copies/mL). A negative result must be combined with clinical observations, patient history, and epidemiological information. The expected result is Negative.  Fact Sheet for Patients:  EntrepreneurPulse.com.au  Fact Sheet for Healthcare Providers:  IncredibleEmployment.be  This test is no t yet approved or cleared by the [place] FDA and  has been authorized for detection and/or diagnosis of SARS-CoV-2 by FDA under an Emergency Use Authorization (EUA). This EUA will remain  in effect (meaning this test can be used) for the duration of the COVID-19 declaration under Section 564(b)(1) of the Act, 21 U.S.C.section 360bbb-3(b)(1), unless the authorization is terminated  or revoked sooner.       Influenza A by PCR NEGATIVE NEGATIVE Final   Influenza B by PCR NEGATIVE NEGATIVE Final    Comment: (NOTE) The Xpert Xpress SARS-CoV-2/FLU/RSV plus assay is intended as an aid in the diagnosis of influenza from Nasopharyngeal swab specimens and should not be used as a sole basis for treatment. Nasal washings and aspirates are unacceptable for Xpert Xpress SARS-CoV-2/FLU/RSV testing.  Fact Sheet for Patients: EntrepreneurPulse.com.au  Fact Sheet for Healthcare Providers: IncredibleEmployment.be  This test is not yet approved or cleared by the [place] FDA and has been authorized for detection and/or diagnosis of SARS-CoV-2 by FDA under an Emergency Use Authorization (EUA). This EUA will remain in effect (meaning this test can be used) for the duration of  the COVID-19  declaration under Section 564(b)(1) of the Act, 21 U.S.C. section 360bbb-3(b)(1), unless the authorization is terminated or revoked.  Performed at [facility], [address] [place], [place] [address]   Gastrointestinal Panel by PCR , Stool     Status: None   Collection Time: 11/11/21  7:20 PM   Specimen: Stool  Result Value Ref Range Status   Campylobacter species NOT DETECTED NOT DETECTED Final   Plesimonas shigelloides NOT DETECTED NOT DETECTED Final   Salmonella species NOT DETECTED NOT DETECTED Final   Yersinia enterocolitica NOT DETECTED NOT DETECTED Final   Vibrio species NOT DETECTED NOT DETECTED Final   Vibrio cholerae NOT DETECTED NOT DETECTED Final   Enteroaggregative E coli (EAEC) NOT DETECTED NOT DETECTED Final   Enteropathogenic E coli (EPEC) NOT DETECTED NOT DETECTED Final   Enterotoxigenic E coli (ETEC) NOT DETECTED NOT DETECTED Final   Shiga like toxin producing E coli (STEC) NOT DETECTED NOT DETECTED Final   Shigella/Enteroinvasive E coli (EIEC) NOT DETECTED NOT DETECTED Final   Cryptosporidium NOT DETECTED NOT DETECTED Final   Cyclospora cayetanensis NOT DETECTED NOT DETECTED Final   Entamoeba histolytica NOT DETECTED NOT DETECTED Final   Giardia lamblia NOT DETECTED NOT DETECTED Final   Adenovirus F40/41 NOT DETECTED NOT DETECTED Final   Astrovirus NOT DETECTED NOT DETECTED Final   Norovirus GI/GII NOT DETECTED NOT DETECTED Final   Rotavirus A NOT DETECTED NOT DETECTED Final   Sapovirus (I, II, IV, and V) NOT DETECTED NOT DETECTED Final    Comment: Performed at [facility], [address] [place], [place] [address]  C Difficile Quick Screen w PCR reflex     Status: None   Collection Time: 11/11/21  7:20 PM   Specimen: Stool  Result Value Ref Range Status   C Diff antigen NEGATIVE NEGATIVE Final   C Diff toxin NEGATIVE NEGATIVE Final   C Diff interpretation No C. difficile detected.  Final    Comment: Performed at [facility], [address] [place], [place] [address]  Urine Culture     Status: Abnormal   Collection Time: 11/13/21  6:30 AM   Specimen: Urine, Clean Catch  Result Value Ref Range Status   Specimen Description   Final    URINE, CLEAN CATCH Performed at [facility], [address], [place], [place] [address]    Special Requests   Final    NONE Performed at [facility], [address] [place], [place] [address]    Culture (A)  Final    60,000 COLONIES/mL LACTOBACILLUS SPECIES Standardized susceptibility testing for this organism is not available. Performed at [facility], [place] [address], [place], [place] [address]    Report Status 11/15/2021 FINAL  Final     Labs: BNP (last 3 results) No results for input(s): BNP in the last 8760 hours. Basic Metabolic Panel: Recent Labs  Lab 11/12/21 0517 11/13/21 0611 11/15/21 0525 11/16/21 0353 11/17/21 0315  NA 138 140 141 140 138  K 3.4* 3.2* 3.3* 4.2 4.1  CL 111 112* 109 110 112*  CO2 21* 21* 25 20* 20*  GLUCOSE 87 97 90 98 90  BUN 18 10 7 15  24*  CREATININE 0.70 0.57 0.63 0.66 0.73  CALCIUM 8.6* 8.8* 8.8* 9.1 8.5*  MG  --  1.7 1.5* 2.4  --    Liver Function Tests: Recent Labs  Lab 11/13/21 0611  AST 26  ALT 31  ALKPHOS 77  BILITOT 0.5  PROT 5.6*  ALBUMIN  3.2*   No results for input(s): LIPASE, AMYLASE in the last 168 hours. No results for input(s): AMMONIA in the last 168 hours. CBC: Recent Labs  Lab 11/12/21 0517 11/13/21 0611 11/15/21 0525  WBC 5.8 5.6 6.7  HGB 12.8 13.2 13.4  HCT 38.5 40.7 39.7  MCV 91.4 92.5 90.6  PLT 191 172 186   Cardiac Enzymes: No results for input(s): CKTOTAL, CKMB, CKMBINDEX, TROPONINI in the last 168 hours. BNP: Invalid input(s): POCBNP CBG: Recent Labs  Lab 11/17/21 1615 11/17/21 1755 11/18/21 0013 11/18/21 0521 11/18/21 1133  GLUCAP 134* 119* 98 91 90   D-Dimer No results for input(s): DDIMER in the last 72 hours. Hgb A1c No results for input(s): HGBA1C in the last  72 hours. Lipid Profile No results for input(s): CHOL, HDL, LDLCALC, TRIG, CHOLHDL, LDLDIRECT in the last 72 hours. Thyroid function studies No results for input(s): TSH, T4TOTAL, T3FREE, THYROIDAB in the last 72 hours.  Invalid input(s): FREET3 Anemia work up No results for input(s): VITAMINB12, FOLATE, FERRITIN, TIBC, IRON, RETICCTPCT in the last 72 hours. Urinalysis    Component Value Date/Time   COLORURINE YELLOW 11/13/2021 0630   APPEARANCEUR CLEAR 11/13/2021 0630   LABSPEC 1.009 11/13/2021 0630   PHURINE 6.0 11/13/2021 0630   GLUCOSEU NEGATIVE 11/13/2021 0630   GLUCOSEU NEG mg/dL 09/05/2007 2337   HGBUR NEGATIVE 11/13/2021 0630   HGBUR negative 03/13/2011 0957   BILIRUBINUR NEGATIVE 11/13/2021 0630   BILIRUBINUR small (A) 07/18/2021 1411   BILIRUBINUR neg 06/23/2018 1434   KETONESUR NEGATIVE 11/13/2021 0630   PROTEINUR NEGATIVE 11/13/2021 0630   UROBILINOGEN 4.0 (A) 07/18/2021 1411   UROBILINOGEN 0.2 07/03/2015 2014   NITRITE NEGATIVE 11/13/2021 0630   LEUKOCYTESUR NEGATIVE 11/13/2021 0630   Sepsis Labs Invalid input(s): PROCALCITONIN,  WBC,  LACTICIDVEN Microbiology Recent Results (from the past 240 hour(s))  Resp Panel by RT-PCR (Flu A&B, Covid) Nasopharyngeal Swab     Status: None   Collection Time: 11/11/21  4:52 PM   Specimen: Nasopharyngeal Swab; Nasopharyngeal(NP) swabs in vial transport medium  Result Value Ref Range Status   SARS Coronavirus 2 by RT PCR NEGATIVE NEGATIVE Final    Comment: (NOTE) SARS-CoV-2 target nucleic acids are NOT DETECTED.  The SARS-CoV-2 RNA is generally detectable in upper respiratory specimens during the acute phase of infection. The lowest concentration of SARS-CoV-2 viral copies this assay can detect is 138 copies/mL. A negative result does not preclude SARS-Cov-2 infection and should not be used as the sole basis for treatment or other patient management decisions. A negative result may occur with  improper specimen  collection/handling, submission of specimen other than nasopharyngeal swab, presence of viral mutation(s) within the areas targeted by this assay, and inadequate number of viral copies(<138 copies/mL). A negative result must be combined with clinical observations, patient history, and epidemiological information. The expected result is Negative.  Fact Sheet for Patients:  EntrepreneurPulse.com.au  Fact Sheet for Healthcare Providers:  IncredibleEmployment.be  This test is no t yet approved or cleared by the [place] FDA and  has been authorized for detection and/or diagnosis of SARS-CoV-2 by FDA under an Emergency Use Authorization (EUA). This EUA will remain  in effect (meaning this test can be used) for the duration of the COVID-19 declaration under Section 564(b)(1) of the Act, 21 U.S.C.section 360bbb-3(b)(1), unless the authorization is terminated  or revoked sooner.       Influenza A by PCR NEGATIVE NEGATIVE Final   Influenza B by PCR NEGATIVE NEGATIVE Final  Comment: (NOTE) The Xpert Xpress SARS-CoV-2/FLU/RSV plus assay is intended as an aid in the diagnosis of influenza from Nasopharyngeal swab specimens and should not be used as a sole basis for treatment. Nasal washings and aspirates are unacceptable for Xpert Xpress SARS-CoV-2/FLU/RSV testing.  Fact Sheet for Patients: EntrepreneurPulse.com.au  Fact Sheet for Healthcare Providers: IncredibleEmployment.be  This test is not yet approved or cleared by the [place] FDA and has been authorized for detection and/or diagnosis of SARS-CoV-2 by FDA under an Emergency Use Authorization (EUA). This EUA will remain in effect (meaning this test can be used) for the duration of the COVID-19 declaration under Section 564(b)(1) of the Act, 21 U.S.C. section 360bbb-3(b)(1), unless the authorization is terminated or revoked.  Performed at [facility], [address] [place], [place] [address]   Gastrointestinal Panel by PCR , Stool     Status: None   Collection Time: 11/11/21  7:20 PM   Specimen: Stool  Result Value Ref Range Status   Campylobacter species NOT DETECTED NOT DETECTED Final   Plesimonas shigelloides NOT DETECTED NOT DETECTED Final   Salmonella species NOT DETECTED NOT DETECTED Final   Yersinia enterocolitica NOT DETECTED NOT DETECTED Final   Vibrio species NOT DETECTED NOT DETECTED Final   Vibrio cholerae NOT DETECTED NOT DETECTED Final   Enteroaggregative E coli (EAEC) NOT DETECTED NOT DETECTED Final   Enteropathogenic E coli (EPEC) NOT DETECTED NOT DETECTED Final   Enterotoxigenic E coli (ETEC) NOT DETECTED NOT DETECTED Final   Shiga like toxin producing E coli (STEC) NOT DETECTED NOT DETECTED Final   Shigella/Enteroinvasive E coli (EIEC) NOT DETECTED NOT DETECTED Final   Cryptosporidium NOT DETECTED NOT DETECTED Final   Cyclospora cayetanensis NOT DETECTED NOT DETECTED Final   Entamoeba histolytica NOT DETECTED NOT DETECTED Final   Giardia lamblia NOT DETECTED NOT DETECTED Final   Adenovirus F40/41 NOT DETECTED NOT DETECTED Final   Astrovirus NOT DETECTED NOT DETECTED Final   Norovirus GI/GII NOT DETECTED NOT DETECTED Final   Rotavirus A NOT DETECTED NOT DETECTED Final   Sapovirus (I, II, IV, and V) NOT DETECTED NOT DETECTED Final    Comment: Performed at [facility], [address] [place], [place] [address]  C Difficile Quick Screen w PCR reflex     Status: None   Collection Time: 11/11/21  7:20 PM   Specimen: Stool  Result Value Ref Range Status   C Diff antigen NEGATIVE NEGATIVE Final   C Diff toxin NEGATIVE NEGATIVE Final   C Diff interpretation No C. difficile detected.  Final    Comment: Performed at [facility], [address], [place], [place] [address]  Urine Culture     Status: Abnormal   Collection Time: 11/13/21  6:30 AM   Specimen: Urine, Clean Catch  Result Value Ref Range  Status   Specimen Description   Final    URINE, CLEAN CATCH Performed at [facility], [address] [place], [place] [address]    Special Requests   Final    NONE Performed at [facility], [address], [place], [place] [address]    Culture (A)  Final    60,000 COLONIES/mL LACTOBACILLUS SPECIES Standardized susceptibility testing for this organism is not available. Performed at [facility], [place] [address], [place], [place] [address]    Report Status 11/15/2021 FINAL  Final     Time coordinating discharge: 8mins  SIGNED:   Kathie Dike, MD  [facility] 11/18/2021, 11:18 PM  If 7PM-7AM, please contact night-coverage [email or web address]

## 2021-11-18 NOTE — Progress Notes (Signed)
Subjective: Chronic abdominal pain 7/10 in severity since having multiple bowel surgeries. Currently about 8/10, getting back to her baseline. Abdominal pain and bloating do worsen with meals, but overall improving. Still eating a few bites then stopping to prevent any significant pain. Denies nausea/vomiting in 48 hours. Bowels continue to move fairly well, had about 10 BMs yesterday. This is normal for her. States she has chronic diarrhea since bowel surgeries. Everything she eats goes through her. She can have up to 20 Bms per day at baseline. 1 BM this morning so far. No brbpr or melena.   States she has been seeing Dr. Cheryln Manly at [facility] for consideration of ventral hernia repair. Had an appt on 11/29, but cancelled this due to acute illness.   She would like to try to go home today and see how she does.   Objective: Vital signs in last 24 hours: Temp:  [97.9 F (36.6 C)-98.3 F (36.8 C)] 98 F (36.7 C) (11/22 0735) Pulse Rate:  [73-88] 74 (11/22 0735) Resp:  [16-20] 20 (11/22 0735) BP: (86-119)/(53-66) 119/66 (11/22 0735) SpO2:  [92 %-96 %] 96 % (11/22 0735) Last BM Date: 11/17/21 General:   Alert and oriented, pleasant Head:  Normocephalic and atraumatic. Eyes:  No icterus, sclera clear. Conjuctiva pink.  Abdomen:  Bowel sounds present, soft, non-distended. Moderate generalized TTP (about the same as yesterday per patient), ventral midline hernia present with tenderness (chronic), no rebound or guarding.  Msk:  Symmetrical without gross deformities. Normal posture. Extremities:  Without edema. Neurologic:  Alert and  oriented x4;  grossly normal neurologically. Skin:  Warm and dry, intact without significant lesions.  Psych:  Normal mood and affect.  Intake/Output from previous day: 11/21 0701 - 11/22 0700 In: 1040 [P.O.:1040] Out: 0960 [Urine:1650] Intake/Output this shift: No intake/output data recorded.  Lab Results: No results for input(s): WBC, HGB,  HCT, PLT in the last 72 hours. 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  CT this admission with mildly dilated colon with air-fluid levels suggestive of diarrheal illness, partial or developing bowel obstruction not excluded, and possible proctitis.  C. difficile testing negative.  GI pathogen panel negative. Small bowel follow-through with slow transit throughout the small bowel and barium filled loops in the RUQ likely related to SBO.  Suspected symptoms were secondary to GI pseudoobstruction, possibly gastroparesis, and small bowel dysmotility.  Unable to rule out stricture in light of multiple abdominal/bowel surgeries previously.  She is started on erythromycin on 11/20 as she was unable to tolerate Reglan due to reports of tremors with this.  She has also been started on Xifaxan 3 times daily, senna twice daily, and Carafate.  Clinically, she has had mild improvement since admission. Able to tolerate small amounts of a heart healthy diet and feels her abdominal pain and distension are improving. Bowels are moving well, diarrhea at  baseline. No nausea/vomiting x 48 hours. She would like to try going home today. Considering ongoing postprandial abdominal pain, worse with solid foods, would recommend following a soft/full liquid diet for the next few days and consuming small frequent meals. I think it is  reasonable to try discharging home with strict return precautions if worsening symptoms.    Plan: Will back down to soft diet today. Recommend soft or full liquid diet for now based on tolerability and advance diet as tolerated based on further clinical improvement. Discussed utilizing protein shakes at home to help with nutritional intake.  Should consume 4-6 small frequent meals throughout the day.  Continue erythromycin 125 mg TID before meals while inpatient.  At discharge, she should stop erythromycin and start Motegrity 1 mg daily.  Complete course of Xifaxan TID for 14 days Continue Senna BID Continue Carafate Continue PPI BID.  As long as patient continues to do well today, I feel it would be reasonable to consider discharge this evening per her request. Will discuss with Dr. Jenetta Downer.  Pending clinical course, may need to consider seeing general surgery for evaluation of adhesive disease.    LOS: 6 days    11/18/2021, 9:24 AM   Barbara Altes, PA-C [facility] Gastroenterology

## 2021-11-19 ENCOUNTER — Telehealth (INDEPENDENT_AMBULATORY_CARE_PROVIDER_SITE_OTHER): Payer: Self-pay

## 2021-11-19 ENCOUNTER — Telehealth: Payer: Self-pay | Admitting: *Deleted

## 2021-11-19 NOTE — Telephone Encounter (Signed)
Transition Care Management Unsuccessful Follow-up Telephone Call  Date of discharge and from where:  11/18/2021 [facility]  Attempts:  1st Attempt  Reason for unsuccessful TCM follow-up call:  Left voice message

## 2021-11-19 NOTE — Telephone Encounter (Signed)
Harvel Quale, MD  Erenest Rasher, PA-C; [place], [place], CMA Hi Cyril Mourning,  Thanks for following up with this. I'm cc'ing Makinzee Durley in case we need that PA auth.  Yes, the dose is Motegrity 1 mg qday.   Thanks so much for your help   Maylon Peppers, MD  Gastroenterology and Hepatology  [facility]        Previous Messages   ----- Message -----  From: Roselyn Reef  Sent: 11/19/2021   2:55 PM EST  To: Harvel Quale, MD  Subject: Hospital Follow-up/medication PA               Hi Dr. Jenetta Downer,   Just wanted to follow-up with you on this patient. I did not send a request for follow-up/PA for Motegrity to your office yesterday. However, I would be happy to take care of this. I am not sure on the dose of Motegrity you were recommending, 1 vs 2 mg daily. Looks like Dr. Laural Golden mentioned started 1 mg daily.   Let me know if you would like me to take care of this.   Thanks,  Aliene Altes, [facility]

## 2021-11-23 ENCOUNTER — Telehealth: Payer: Self-pay | Admitting: Gastroenterology

## 2021-11-23 NOTE — Telephone Encounter (Signed)
Please arrange hospital follow-up for this patient in 2 weeks with Dr. Jenetta Downer.

## 2021-11-26 ENCOUNTER — Other Ambulatory Visit (INDEPENDENT_AMBULATORY_CARE_PROVIDER_SITE_OTHER): Payer: Self-pay

## 2021-11-26 NOTE — Telephone Encounter (Signed)
So the Rx would be for Motegrity 1 mg qday x30 with 3 refills Dx: small bowel dysmotility, gastroparesis.  Thanks

## 2021-11-26 NOTE — Telephone Encounter (Signed)
I have called [facility] to find out if the patient picked up the [place]. [facility] states no it needed an authorization. [facility] did not send the rx to them. They need a new rx from [place] so we can do the PA. We are unable to do a PA on a medication we did not submit to them . I tried to resubmit using our name, but I do not know what the diagnoses is to associate it with. Please advise or submit to [facility], so they can send me the PA to do. Thanks.

## 2021-11-27 ENCOUNTER — Other Ambulatory Visit (INDEPENDENT_AMBULATORY_CARE_PROVIDER_SITE_OTHER): Payer: Self-pay

## 2021-11-27 DIAGNOSIS — K3184 Gastroparesis: Secondary | ICD-10-CM

## 2021-11-27 DIAGNOSIS — K909 Intestinal malabsorption, unspecified: Secondary | ICD-10-CM

## 2021-11-27 MED ORDER — MOTEGRITY 1 MG PO TABS: 1.0000 mg | ORAL_TABLET | Freq: Every day | ORAL | 3 refills | Status: DC

## 2021-11-27 NOTE — Telephone Encounter (Signed)
Medication submitted to requested pharmacy under our practice name. Will await a Pa if one is requested.

## 2021-11-28 ENCOUNTER — Encounter (INDEPENDENT_AMBULATORY_CARE_PROVIDER_SITE_OTHER): Payer: Self-pay

## 2021-11-28 DIAGNOSIS — K599 Functional intestinal disorder, unspecified: Secondary | ICD-10-CM | POA: Insufficient documentation

## 2021-11-28 DIAGNOSIS — K3184 Gastroparesis: Secondary | ICD-10-CM | POA: Insufficient documentation

## 2021-12-01 ENCOUNTER — Telehealth (INDEPENDENT_AMBULATORY_CARE_PROVIDER_SITE_OTHER): Payer: Self-pay

## 2021-12-01 ENCOUNTER — Emergency Department (HOSPITAL_COMMUNITY): Payer: Medicare Other

## 2021-12-01 ENCOUNTER — Other Ambulatory Visit: Payer: Self-pay

## 2021-12-01 ENCOUNTER — Encounter (HOSPITAL_COMMUNITY): Payer: Self-pay | Admitting: Emergency Medicine

## 2021-12-01 ENCOUNTER — Inpatient Hospital Stay (HOSPITAL_COMMUNITY)
Admission: EM | Admit: 2021-12-01 | Discharge: 2021-12-05 | DRG: 392 | Disposition: A | Discharge status: Home / Self Care | Payer: Medicare Other | Attending: Family Medicine | Admitting: Family Medicine

## 2021-12-01 DIAGNOSIS — Z853 Personal history of malignant neoplasm of breast: Secondary | ICD-10-CM

## 2021-12-01 DIAGNOSIS — E039 Hypothyroidism, unspecified: Secondary | ICD-10-CM | POA: Diagnosis not present

## 2021-12-01 DIAGNOSIS — E8881 Metabolic syndrome: Secondary | ICD-10-CM | POA: Diagnosis not present

## 2021-12-01 DIAGNOSIS — Z043 Encounter for examination and observation following other accident: Secondary | ICD-10-CM | POA: Diagnosis not present

## 2021-12-01 DIAGNOSIS — Z8543 Personal history of malignant neoplasm of ovary: Secondary | ICD-10-CM

## 2021-12-01 DIAGNOSIS — Z90722 Acquired absence of ovaries, bilateral: Secondary | ICD-10-CM

## 2021-12-01 DIAGNOSIS — F4322 Adjustment disorder with anxiety: Secondary | ICD-10-CM | POA: Diagnosis present

## 2021-12-01 DIAGNOSIS — Z6831 Body mass index (BMI) 31.0-31.9, adult: Secondary | ICD-10-CM

## 2021-12-01 DIAGNOSIS — Z7901 Long term (current) use of anticoagulants: Secondary | ICD-10-CM

## 2021-12-01 DIAGNOSIS — D649 Anemia, unspecified: Secondary | ICD-10-CM | POA: Diagnosis not present

## 2021-12-01 DIAGNOSIS — Z8673 Personal history of transient ischemic attack (TIA), and cerebral infarction without residual deficits: Secondary | ICD-10-CM

## 2021-12-01 DIAGNOSIS — M1990 Unspecified osteoarthritis, unspecified site: Secondary | ICD-10-CM | POA: Diagnosis present

## 2021-12-01 DIAGNOSIS — D509 Iron deficiency anemia, unspecified: Secondary | ICD-10-CM | POA: Diagnosis present

## 2021-12-01 DIAGNOSIS — I10 Essential (primary) hypertension: Secondary | ICD-10-CM | POA: Diagnosis not present

## 2021-12-01 DIAGNOSIS — Z981 Arthrodesis status: Secondary | ICD-10-CM | POA: Diagnosis not present

## 2021-12-01 DIAGNOSIS — Z9012 Acquired absence of left breast and nipple: Secondary | ICD-10-CM

## 2021-12-01 DIAGNOSIS — K2970 Gastritis, unspecified, without bleeding: Secondary | ICD-10-CM | POA: Diagnosis not present

## 2021-12-01 DIAGNOSIS — K921 Melena: Secondary | ICD-10-CM | POA: Diagnosis not present

## 2021-12-01 DIAGNOSIS — R195 Other fecal abnormalities: Secondary | ICD-10-CM | POA: Diagnosis not present

## 2021-12-01 DIAGNOSIS — K219 Gastro-esophageal reflux disease without esophagitis: Secondary | ICD-10-CM | POA: Diagnosis present

## 2021-12-01 DIAGNOSIS — K5909 Other constipation: Secondary | ICD-10-CM | POA: Diagnosis present

## 2021-12-01 DIAGNOSIS — Z9049 Acquired absence of other specified parts of digestive tract: Secondary | ICD-10-CM

## 2021-12-01 DIAGNOSIS — K3184 Gastroparesis: Secondary | ICD-10-CM | POA: Diagnosis present

## 2021-12-01 DIAGNOSIS — Z9889 Other specified postprocedural states: Secondary | ICD-10-CM | POA: Diagnosis not present

## 2021-12-01 DIAGNOSIS — Z881 Allergy status to other antibiotic agents status: Secondary | ICD-10-CM

## 2021-12-01 DIAGNOSIS — I959 Hypotension, unspecified: Secondary | ICD-10-CM | POA: Diagnosis not present

## 2021-12-01 DIAGNOSIS — F909 Attention-deficit hyperactivity disorder, unspecified type: Secondary | ICD-10-CM | POA: Diagnosis present

## 2021-12-01 DIAGNOSIS — Z20822 Contact with and (suspected) exposure to covid-19: Secondary | ICD-10-CM | POA: Diagnosis present

## 2021-12-01 DIAGNOSIS — R112 Nausea with vomiting, unspecified: Secondary | ICD-10-CM | POA: Diagnosis present

## 2021-12-01 DIAGNOSIS — Z9079 Acquired absence of other genital organ(s): Secondary | ICD-10-CM

## 2021-12-01 DIAGNOSIS — R109 Unspecified abdominal pain: Secondary | ICD-10-CM | POA: Diagnosis present

## 2021-12-01 DIAGNOSIS — R1084 Generalized abdominal pain: Secondary | ICD-10-CM | POA: Diagnosis not present

## 2021-12-01 DIAGNOSIS — K649 Unspecified hemorrhoids: Secondary | ICD-10-CM | POA: Diagnosis present

## 2021-12-01 DIAGNOSIS — E669 Obesity, unspecified: Secondary | ICD-10-CM | POA: Diagnosis present

## 2021-12-01 DIAGNOSIS — E860 Dehydration: Secondary | ICD-10-CM | POA: Diagnosis present

## 2021-12-01 DIAGNOSIS — F1120 Opioid dependence, uncomplicated: Secondary | ICD-10-CM | POA: Diagnosis not present

## 2021-12-01 DIAGNOSIS — R1310 Dysphagia, unspecified: Secondary | ICD-10-CM | POA: Diagnosis not present

## 2021-12-01 DIAGNOSIS — G894 Chronic pain syndrome: Secondary | ICD-10-CM | POA: Diagnosis present

## 2021-12-01 DIAGNOSIS — Z888 Allergy status to other drugs, medicaments and biological substances status: Secondary | ICD-10-CM

## 2021-12-01 DIAGNOSIS — Z90710 Acquired absence of both cervix and uterus: Secondary | ICD-10-CM

## 2021-12-01 DIAGNOSIS — R55 Syncope and collapse: Secondary | ICD-10-CM | POA: Diagnosis not present

## 2021-12-01 DIAGNOSIS — Z79899 Other long term (current) drug therapy: Secondary | ICD-10-CM

## 2021-12-01 DIAGNOSIS — Z885 Allergy status to narcotic agent status: Secondary | ICD-10-CM

## 2021-12-01 DIAGNOSIS — M797 Fibromyalgia: Secondary | ICD-10-CM | POA: Diagnosis present

## 2021-12-01 DIAGNOSIS — Z86711 Personal history of pulmonary embolism: Secondary | ICD-10-CM

## 2021-12-01 DIAGNOSIS — W19XXXA Unspecified fall, initial encounter: Secondary | ICD-10-CM | POA: Diagnosis present

## 2021-12-01 DIAGNOSIS — Z7982 Long term (current) use of aspirin: Secondary | ICD-10-CM

## 2021-12-01 DIAGNOSIS — Z8614 Personal history of Methicillin resistant Staphylococcus aureus infection: Secondary | ICD-10-CM

## 2021-12-01 DIAGNOSIS — E785 Hyperlipidemia, unspecified: Secondary | ICD-10-CM | POA: Diagnosis present

## 2021-12-01 DIAGNOSIS — E1143 Type 2 diabetes mellitus with diabetic autonomic (poly)neuropathy: Secondary | ICD-10-CM | POA: Diagnosis present

## 2021-12-01 DIAGNOSIS — Z79890 Hormone replacement therapy (postmenopausal): Secondary | ICD-10-CM

## 2021-12-01 DIAGNOSIS — E876 Hypokalemia: Secondary | ICD-10-CM | POA: Diagnosis present

## 2021-12-01 DIAGNOSIS — R079 Chest pain, unspecified: Secondary | ICD-10-CM | POA: Diagnosis not present

## 2021-12-01 DIAGNOSIS — Z86718 Personal history of other venous thrombosis and embolism: Secondary | ICD-10-CM

## 2021-12-01 DIAGNOSIS — Z803 Family history of malignant neoplasm of breast: Secondary | ICD-10-CM

## 2021-12-01 DIAGNOSIS — Z8249 Family history of ischemic heart disease and other diseases of the circulatory system: Secondary | ICD-10-CM

## 2021-12-01 LAB — BASIC METABOLIC PANEL
Anion gap: 6 (ref 5–15)
BUN: 17 mg/dL (ref 6–20)
CO2: 31 mmol/L (ref 22–32)
Calcium: 8 mg/dL — ABNORMAL LOW (ref 8.9–10.3)
Chloride: 99 mmol/L (ref 98–111)
Creatinine, Ser: 0.91 mg/dL (ref 0.44–1.00)
GFR, Estimated: >60 mL/min (ref >60)
Glucose, Bld: 125 mg/dL — ABNORMAL HIGH (ref 70–99)
Potassium: 2.7 mmol/L — CL (ref 3.5–5.1)
Sodium: 136 mmol/L (ref 135–145)

## 2021-12-01 LAB — MAGNESIUM: Magnesium: 1.3 mg/dL — ABNORMAL LOW (ref 1.7–2.4)

## 2021-12-01 LAB — CBC
HCT: 36 % (ref 36.0–46.0)
Hemoglobin: 11.8 g/dL — ABNORMAL LOW (ref 12.0–15.0)
MCH: 30 pg (ref 26.0–34.0)
MCHC: 32.8 g/dL (ref 30.0–36.0)
MCV: 91.6 fL (ref 80.0–100.0)
Platelets: 212 10*3/uL (ref 150–400)
RBC: 3.93 MIL/uL (ref 3.87–5.11)
RDW: 14.2 % (ref 11.5–15.5)
WBC: 8.8 10*3/uL (ref 4.0–10.5)
nRBC: 0 % (ref 0.0–0.2)

## 2021-12-01 LAB — URINALYSIS, ROUTINE W REFLEX MICROSCOPIC
Bilirubin Urine: NEGATIVE
Glucose, UA: NEGATIVE mg/dL
Hgb urine dipstick: NEGATIVE
Ketones, ur: NEGATIVE mg/dL
Leukocytes,Ua: NEGATIVE
Nitrite: NEGATIVE
Protein, ur: NEGATIVE mg/dL
Specific Gravity, Urine: 1.008 (ref 1.005–1.030)
pH: 7 (ref 5.0–8.0)

## 2021-12-01 LAB — RESP PANEL BY RT-PCR (FLU A&B, COVID) ARPGX2
Influenza A by PCR: NEGATIVE
Influenza B by PCR: NEGATIVE
SARS Coronavirus 2 by RT PCR: NEGATIVE

## 2021-12-01 MED ORDER — MAGNESIUM SULFATE 2 GM/50ML IV SOLN
2.0000 g | Freq: Once | INTRAVENOUS | Status: AC
  Administered 2021-12-01: 2 g via INTRAVENOUS
  Filled 2021-12-01: qty 50

## 2021-12-01 MED ORDER — POTASSIUM CHLORIDE 10 MEQ/100ML IV SOLN
10.0000 meq | INTRAVENOUS | Status: AC
  Administered 2021-12-01 (×3): 10 meq via INTRAVENOUS
  Filled 2021-12-01 (×3): qty 100

## 2021-12-01 MED ORDER — SODIUM CHLORIDE 0.9 % IV BOLUS
1000.0000 mL | Freq: Once | INTRAVENOUS | Status: AC
  Administered 2021-12-01: 1000 mL via INTRAVENOUS

## 2021-12-01 MED ORDER — HYDROMORPHONE HCL 1 MG/ML IJ SOLN
1.0000 mg | INTRAMUSCULAR | Status: AC | PRN
  Administered 2021-12-01 – 2021-12-02 (×3): 1 mg via INTRAVENOUS
  Filled 2021-12-01 (×2): qty 1

## 2021-12-01 MED ORDER — SODIUM CHLORIDE 0.9 % IV BOLUS
500.0000 mL | Freq: Once | INTRAVENOUS | Status: AC
  Administered 2021-12-01: 500 mL via INTRAVENOUS

## 2021-12-01 NOTE — ED Provider Notes (Signed)
[facility] EMERGENCY DEPARTMENT Provider Note   CSN: [id number] Arrival date & time: 12/01/21  1544     History Chief Complaint  Patient presents with   Loss of Consciousness    [name] is a 60 y.o. female.  HPI She is here for her personal concern of bowel obstruction.  She feels like she needs some fluids that will not pass and then she vomits.  She reports persistent problem like this since discharge from the hospital, after comprehensive evaluation for the same thing, on 11/18/2021.  She has not followed up with GI, or surgery since that time.  She has chronic constipation for which she takes daily stool softeners.  She has had multiple stools in the last 2 days preceded by normal stools the prior 2 days.  She does not have any blood in her emesis.  She denies fever, chills, weakness or dizziness.  She is taking her usual pain medicine and the rest of her medicine, without relief.  Early this morning, shortly after arising, she was at the foot of her bed when she passed out.  She feels like she injured her right ribs and upper back, when she fell because of syncope.  Her husband was in bed at the time and awoke to find her groggy groggy on the floor."  She had a similar syncopal episode 2 weeks ago.  He is not currently taking potassium but is taking magnesium regularly she denies fever, chills, cough, shortness of breath, headache or neck pain.  There are no other known active modifying factors worse.    Past Medical History:  Diagnosis Date   Abdominal hernia 08/14/2019   Abdominal pain 06/06/2019   Acute bronchitis 05/01/2013   Acute cystitis 06/09/2010   Qualifier: Diagnosis of  By: Cori Razor LPN, Brandi     Acute renal insufficiency 08/28/2011   Allergy    Phreesia 03/16/2021   Anemia    Phreesia 10/29/2020   Anxiety    Anxiety state 03/16/2008   Qualifier: Diagnosis of  By: Dierdre Harness     Arthritis    Phreesia 10/29/2020   Back pain with radiation 07/17/2014    Bilateral hand pain 07/27/2016   Blood transfusion without reported diagnosis    Phreesia 10/29/2020   Breast cancer ([place])    L breast- 2006   Cancer ([place])    Phreesia 10/29/2020   Carpal tunnel syndrome    Chronic constipation    Chronic pain syndrome    followed by [facility]---  back   Clotting disorder ([place])    Phreesia 10/29/2020   Depression    Dermatitis 12/15/2011   Difficult intubation    Educated about COVID-19 virus infection 05/14/2019   Fall at home 01/26/2016   Family history of adverse reaction to anesthesia    MOTHER--- PONV   Fatigue 09/17/2012   Fibromyalgia    FIBROMYALGIA 03/16/2008   Qualifier: Diagnosis of  By: Dierdre Harness     GERD (gastroesophageal reflux disease)    Headache disorder 01/16/2013   Headache syndrome 01/26/2016   Hip pain, right 07/08/2019   History of MRSA infection    lip abscess   History of ovarian cyst 06/2011   s/p  BSO   History of pulmonary embolus (PE) 1997   post EP with ablation pulmonary veouns for SVT/ Atrial Fib.   History of supraventricular tachycardia    s/p  ablation 1996  and 1997  by dr Caryl Comes   History of TIA (transient  ischemic attack) 1997   post op EP ablation PE   Hyperlipidemia    Hyperlipidemia LDL goal <100 03/16/2008   Qualifier: Diagnosis of  By: Bullins, Luann     Hypothyroidism    followed by pcp   Incisional hernia    Insomnia 12/15/2011   Intermittent palpitations 08/22/2017   Interstitial cystitis    09-13-2018   per pt last flare-up  May 2019 (followed by pcp)   INTERSTITIAL CYSTITIS 02/17/2011   Qualifier: Diagnosis of  By: Moshe Cipro MD, Margaret     Iron deficiency anemia    09-13-2018  PER PT STABLE   Irregular heart rate 07/25/2013   Lipoma of back    upper   Low back pain radiating to right leg 07/04/2019   Low ferritin level 06/14/2014   MDD (major depressive disorder), single episode, in full remission ([place]) 10/27/2017   Medically noncompliant 02/28/2015   Multiple missed appointments,  both follow-up appointments and lab appointments.    Metabolic syndrome X 2/67/1245   Qualifier: Diagnosis of  By: Moshe Cipro MD, Margaret  hBA1c is 5.8 in 02/2013    Migraines    Morbid obesity ([place]) 03/27/2013   Muscle spasm 01/03/2020   Nausea alone 07/17/2014   NECK PAIN, CHRONIC 03/16/2008   Qualifier: Diagnosis of  By: Dierdre Harness     Normal coronary arteries    a. by CT 12/2018.   Obesity 03/16/2008   Qualifier: Diagnosis of  By: Dierdre Harness     Oral ulceration 08/23/2014   Presented at 06/14/2014 visit    Other malaise and fatigue 03/16/2008   Centricity Description: FATIGUE, CHRONIC Qualifier: Diagnosis of  By: Dierdre Harness   Centricity Description: FATIGUE Qualifier: Diagnosis of  By: Claybon Jabs PA, Dawn     OVARIAN CYST 12/26/2009   Qualifier: Diagnosis of  By: Moshe Cipro MD, Margaret     Paget disease of breast, left [place])    Paget's disease of breast, left ([name]) 03/16/2008   Qualifier: Diagnosis of  By: Dierdre Harness  Left diagnosed in 2006 F/h breast cancer x 15 family members   Partial small bowel obstruction ([place]) 07/04/2012   PONV (postoperative nausea and vomiting)    SEVERE   Postsurgical menopause 11/13/2011   Presence of IVC filter 06/06/2019   PSVT (paroxysmal supraventricular tachycardia) ([place])    [place]   Recurrent oral herpes simplex infection 10/13/2017   ROM (right otitis media) 01/23/2013   S/P insertion of IVC (inferior vena caval) filter 05/08/2005   [place] (non-retrievable)  /  dx 2019  a leg of filter is protruding thru the vena cava in to right L2 vertebral body (09-13-2018  per pt having surgery to remove filter in [place])   S/P radiofrequency ablation operation for arrhythmia 1996   1996  and 1997,   SVT and Atrial Fib   Sinusitis, chronic 01/26/2016   SMALL BOWEL OBSTRUCTION, HX OF 08/07/2008   Annotation: obstruction w/ adhesions led to partial colectomy Qualifier: Diagnosis of  By: Craige Cotta     Stroke [facility])    [place]  10/29/2020, TIAs in the past   Swelling of hand 09/17/2012   Syncope    Tachycardia 02/03/2010   Qualifier: Diagnosis of  By: Via LPN, Lynn     Thyroid disease    Phreesia 10/29/2020   Ulcer 12/15/2011   Urinary frequency 07/18/2014   Vaginitis and vulvovaginitis 05/10/2013   Vitamin D deficiency 05/05/2015   Wears glasses     Patient Active Problem  List   Diagnosis Date Noted   Abnormal small bowel motility 11/28/2021   Gastroparesis 11/28/2021   Abnormal CT scan, small bowel    Dilation of small bowel anastomosis    Intractable vomiting 11/12/2021   Bilious vomiting with nausea 11/11/2021   Diarrhea 11/11/2021   Dilation of biliary tract 11/11/2021   Post-resection malabsorption 09/18/2021   Hematuria 07/18/2021   Fever 07/18/2021   Bloating 04/17/2021   Chronic diarrhea 04/17/2021   ADHD, predominantly hyperactive type 01/20/2021   Muscle spasm 01/20/2021   Ovarian cancer ([place]) 12/03/2020   Long-term current use of opiate analgesic 10/22/2020   Migraine 10/22/2020   Muscle spasms of both lower extremities 01/03/2020   Hip pain, right 07/08/2019   Low back pain radiating to right leg 07/04/2019   Abdominal pain 06/06/2019   B12 deficiency 10/11/2018   Other specified abnormal findings of blood chemistry 10/11/2018   Malabsorption 09/23/2018   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, left ([place]) 09/23/2018   Embedded foreign body 03/01/2018   Intermittent palpitations 08/22/2017   Essential hypertension 08/22/2017   Snoring 12/21/2016   Bilateral hand pain 07/27/2016   History of DVT (deep vein thrombosis) 03/30/2016   History of pulmonary embolus (PE) 03/30/2016   S/P insertion of IVC (inferior vena caval) filter 03/05/2016   Headache syndrome 01/26/2016   Syncope 01/23/2016   Spinal stenosis of lumbar region with radiculopathy 08/28/2015   Vitamin D deficiency 05/05/2015   Uncontrolled pain 04/15/2015   Right flank pain 09/07/2014   Chronic  right-sided low back pain with bilateral sciatica 07/17/2014   Chest pain 06/14/2014   Low ferritin level 06/14/2014   Adjustment disorder with anxiety 08/31/2013   Irregular heart rate 07/25/2013   GERD (gastroesophageal reflux disease) 05/10/2013   Seasonal allergies 05/10/2013   Morbid obesity ([place]) 03/27/2013   Encounter for monitoring opioid maintenance therapy 01/16/2013   Chronic pain syndrome 01/16/2013   Fatigue 09/17/2012   Ventral hernia 08/03/2012   Stress incontinence 02/18/2012   Insomnia 12/15/2011   Postsurgical menopause 11/13/2011   Interstitial cystitis 95/18/8416   Metabolic syndrome X 60/63/0160   CARPAL TUNNEL SYNDROME, RIGHT 06/09/2010   OVARIAN CYST 12/26/2009   Iron deficiency anemia 08/07/2008   Hx SBO 08/07/2008   Paget's disease of breast, left ([place]) 03/16/2008   Hypothyroidism 03/16/2008   Hyperlipidemia LDL goal <100 03/16/2008   Anxiety state 03/16/2008   Whole body pain 03/16/2008   FIBROMYALGIA 03/16/2008    Past Surgical History:  Procedure Laterality Date   ABDOMINAL HYSTERECTOMY  1987   ANTERIOR CERVICAL DECOMP/DISCECTOMY FUSION  03-07-2002   dr elsner  @[facility]    C 4 -- 5   APPENDECTOMY  1980   AUGMENTATION MAMMAPLASTY Right 2006   BILATERAL SALPINGOOPHORECTOMY  07/24/2011   via Explor. Lap. w/ intraoperative perf. bowel repair   BIOPSY  05/02/2021   Procedure: BIOPSY;  Surgeon: Montez Morita, Quillian Quince, MD;  [facility];  Service: Gastroenterology;;  small bowel, mid esophagus, distal esophagus, random colon biopsies   BREAST BIOPSY Right 2019   benign   BREAST ENHANCEMENT SURGERY Bilateral 1993   BREAST IMPLANT REMOVAL Bilateral    BREAST SURGERY N/A    Phreesia 10/29/2020   CARDIAC CATHETERIZATION  07-06-2003   dr Darnell Level brodie   normal coronaries and LVF   [place]acy TEST  11-18-2015   dr Caryl Comes   normal nuclear study w/ no ischemia/  normal LV  function and wall motion , ef 84%   CARPAL TUNNEL RELEASE Right ?   CESAREAN SECTION  1986   CESAREAN SECTION N/A    Phreesia 10/29/2020   CHOLECYSTECTOMY N/A    Phreesia 10/29/2020   COLON SURGERY     COLONOSCOPY WITH PROPOFOL N/A 05/02/2021   Procedure: COLONOSCOPY WITH PROPOFOL;  Surgeon: Harvel Quale, MD;  Location[facility];  Service: Gastroenterology;  Laterality: N/A;  12:30 PM   CYSTO/  HYDRODISTENTION/  INSTILATION THERAPY  MULTIPLE   ENTEROCUTANEOUS FISTULA CLOSURE  multiple   last one 2015 with small bowel resection   ESOPHAGOGASTRODUODENOSCOPY (EGD) WITH PROPOFOL N/A 05/02/2021   Procedure: ESOPHAGOGASTRODUODENOSCOPY (EGD) WITH PROPOFOL;  Surgeon: Harvel Quale, MD;  [facility];  Service: Gastroenterology;  Laterality: N/A;   EXPLORATORY LAPAROTOMY INCISIONAL VENTRAL HERNIA REPAIR / RESECTION SMALL BOWEL  11-09-2014   @[facility]    FRACTURE SURGERY N/A    Phreesia 10/29/2020   JOINT REPLACEMENT N/A    Phreesia 10/29/2020   LIPOMA EXCISION Right 09/16/2018   Procedure: EXCISION LIPOMA UPPER BACK;  Surgeon: Clovis Riley, MD;  Location: [place];  Service: General;  Laterality: Right;   MASTECTOMY Left 2006   w/ reconstruction on left (paget's disease)  and right breast augmentation   POLYPECTOMY  05/02/2021   Procedure: POLYPECTOMY;  Surgeon: Harvel Quale, MD;  [facility];  Service: Gastroenterology;;  gastric   SAVORY DILATION  05/02/2021   Procedure: Azzie Almas DILATION;  Surgeon: Harvel Quale, MD;  [facility];  Service: Gastroenterology;;   SMALL INTESTINE SURGERY N/A    Phreesia 10/29/2020   SPINE SURGERY N/A    Phreesia 10/29/2020   TOTAL COLECTOMY  08-04-2002    @[facility]    AND CHOLECYSTECTOMY  (colonic inertia)   TRANSTHORACIC ECHOCARDIOGRAM  02/04/2016   ef 60-65%,  grade 2 diastolic dysfunction/  mild MR   TUBAL LIGATION N/A    Phreesia 03/16/2021   VENA CAVA FILTER  PLACEMENT  05/08/2005   @[facility]    [place] (non-retrievable)   WIDE EXCISION PERIRECTAL ABSCESSES  09-22-2005   @ [facility]     OB History   No obstetric history on file.     Family History  Problem Relation Age of Onset   Diabetes Mother    Heart disease Mother    Hypertension Mother    Heart disease Father    Hyperlipidemia Father    Hypertension Father    Alcohol abuse Father    Colon cancer Maternal Aunt    Breast cancer Maternal Aunt    Cancer Maternal Uncle        mets   Bone cancer Maternal Grandfather        mets   Ovarian cancer Cousin 19   Breast cancer Cousin    Prostate cancer Maternal Uncle    Breast cancer Maternal Aunt    Brain cancer Maternal Aunt    Breast cancer Maternal Aunt     Social History   Tobacco Use   Smoking status: Never   Smokeless tobacco: Never  Vaping Use   Vaping Use: Never used  Substance Use Topics   Alcohol use: No    Comment: twice per year   Drug use: No    Home Medications Prior to Admission medications   Medication Sig Start Date End Date Taking? Authorizing Provider  ALPRAZolam [name]) 1 MG tablet Take 1 tablet (1 mg total) by mouth 2 (two) times daily.  08/26/21   Fayrene Helper, MD  aspirin 81 MG chewable tablet Chew 81 mg by mouth daily.    [provider]  butalbital-acetaminophen-caffeine (FIORICET) 50-325-40 MG tablet Take 1 tablet by mouth as directed. 06/19/19   [provider]  Cholecalciferol 1.25 MG (50000 UT) capsule Take 50,000 Units by mouth once a week.    [provider]  conjugated estrogens (PREMARIN) vaginal cream Place fingertip amount intravaginally 2 to 3 times per week Patient taking differently: Place 0.1 mg vaginally at bedtime. Place fingertip amount intravaginally 2 to 3 times per week 07/12/20   Fayrene Helper, MD  cyanocobalamin (,VITAMIN B-12,) 1000 MCG/ML injection Inject 1 mL (1,000 mcg total) into the muscle every 30 (thirty) days. 09/18/21   Harriett Rush, PA-C  cyclobenzaprine (FLEXERIL) 10 MG tablet TAKE 1 TABLET BY MOUTH TWICE DAILY AS NEEDED FOR MUSCLE SPASMS 02/21/20   Fayrene Helper, MD  cycloSPORINE (RESTASIS) 0.05 % ophthalmic emulsion Place 1 drop into both eyes 2 (two) times daily as needed (dry eyes).    [provider]  DULoxetine (CYMBALTA) 60 MG capsule Take 60 mg by mouth daily. 04/10/21   [provider]  Erythromycin 250 MG TBEC Take 250 mg by mouth 3 (three) times daily before meals. 11/18/21   Kathie Dike, MD  ezetimibe (ZETIA) 10 MG tablet Take 1 tablet (10 mg total) by mouth daily. 09/28/21   Fayrene Helper, MD  fluticasone (FLONASE) 50 MCG/ACT nasal spray Place 2 sprays into both nostrils daily. Patient taking differently: Place 2 sprays into both nostrils daily as needed for allergies. 10/31/20   Fayrene Helper, MD  HYDROmorphone (DILAUDID) 4 MG tablet Take 4 mg by mouth in the morning, at noon, and at bedtime.    [provider]  levothyroxine (SYNTHROID) 125 MCG tablet TAKE 1 TABLET(125 MCG) BY MOUTH DAILY BEFORE BREAKFAST 08/26/21   Fayrene Helper, MD  magnesium oxide (MAG-OX) 400 (240 Mg) MG tablet Take 1 tablet (400 mg total) by mouth 2 (two) times daily. Please make overdue appt with Dr. Caryl Comes before anymore refills. Thank you 1st attempt Patient taking differently: Take 400 mg by mouth daily. Please make overdue appt with Dr. Caryl Comes before anymore refills. Thank you 1st attempt 09/03/21   Deboraha Sprang, MD  meclizine (ANTIVERT) 25 MG tablet Take 1 tablet (25 mg total) by mouth 3 (three) times daily as needed for dizziness. 10/31/20   Fayrene Helper, MD  metoprolol tartrate (LOPRESSOR) 25 MG tablet Take 1 tablet (25 mg total) by mouth 2 (two) times daily. Patient taking differently: Take 25 mg by mouth 2 (two) times daily as needed (Afib). 01/15/21   Fayrene Helper, MD  Multiple Vitamins-Minerals (ICAPS AREDS 2) CAPS Take 1 capsule by mouth at bedtime.     [provider]  omeprazole (PRILOSEC) 40 MG capsule Take 1 capsule (40 mg total) by mouth in the morning and at bedtime. 11/18/21   Kathie Dike, MD  pravastatin (PRAVACHOL) 40 MG tablet TAKE 1 TABLET(40 MG) BY MOUTH DAILY Patient taking differently: Take 40 mg by mouth daily. TAKE 1 TABLET(40 MG) BY MOUTH DAILY 01/15/21   Fayrene Helper, MD  predniSONE (DELTASONE) 10 MG tablet Take 40mg  po daily for 2 days then 30mg  daily for 2 days then 20mg  daily for 2 days then 10mg  daily for 2 days then stop 11/18/21   Kathie Dike, MD  promethazine (PHENERGAN) 25 MG tablet Take by mouth. 07/29/18  [provider]  Prucalopride Succinate (MOTEGRITY) 1 MG TABS Take 1 mg by mouth daily at 2 PM. 11/27/21   Montez Morita, Quillian Quince, MD  rifaximin (XIFAXAN) 550 MG TABS tablet Take 1 tablet (550 mg total) by mouth 3 (three) times daily. 11/18/21   Kathie Dike, MD  senna-docusate (SENOKOT-S) 8.6-50 MG per tablet Take 2 tablets by mouth 2 (two) times daily. constipation    [provider]  Specialty Vitamins Products (VITAMINS FOR HAIR PO) Take 1 capsule by mouth daily.    [provider]  sucralfate (CARAFATE) 1 g tablet Take 1 tablet (1 g total) by mouth 4 (four) times daily -  with meals and at bedtime. 11/18/21   Kathie Dike, MD  vitamin C (ASCORBIC ACID) 500 MG tablet Take 500 mg by mouth at bedtime.    [provider]  XARELTO 20 MG TABS tablet TAKE 1 TABLET(20 MG) BY MOUTH DAILY Patient taking differently: Take 20 mg by mouth daily with supper. 10/03/21   Lindell Spar, MD    Allergies    Nitrofurantoin, Crestor [rosuvastatin calcium], Bisacodyl, Clarithromycin, Clarithromycin, Clindamycin hcl, Codeine, Iron sucrose, Monosodium glutamate, and Scopolamine hbr  Review of Systems   Review of Systems  All other systems reviewed and are negative.  Physical Exam Updated Vital Signs BP 91/60   Pulse 79   Temp 98.4 F (36.9 C)   Resp 13   Ht 5'  4" (1.626 m)   Wt 83.5 kg   SpO2 95%   BMI 31.58 kg/m   Physical Exam Vitals and nursing note reviewed.  Constitutional:      General: She is not in acute distress.    Appearance: She is well-developed. She is not ill-appearing, toxic-appearing or diaphoretic.  HENT:     Head: Normocephalic and atraumatic.     Right Ear: External ear normal.     Left Ear: External ear normal.  Eyes:     Conjunctiva/sclera: Conjunctivae normal.     Pupils: Pupils are equal, round, and reactive to light.  Neck:     Trachea: Phonation normal.  Cardiovascular:     Rate and Rhythm: Normal rate and regular rhythm.     Heart sounds: Normal heart sounds. No murmur heard.   No friction rub.  Pulmonary:     Effort: Respiratory distress present.     Breath sounds: Normal breath sounds.  Abdominal:     General: There is no distension.     Palpations: Abdomen is soft. There is no mass.     Tenderness: There is no abdominal tenderness.     Comments: Large ventral scar, irregular abdominal wall geography, likely hernia present.  No localized skin changes or significant mass palpated.  Musculoskeletal:        General: Normal range of motion.     Cervical back: Normal range of motion and neck supple.  Skin:    General: Skin is warm and dry.  Neurological:     Mental Status: She is alert and oriented to person, place, and time.     Cranial Nerves: No cranial nerve deficit.     Sensory: No sensory deficit.     Motor: No abnormal muscle tone.     Coordination: Coordination normal.  Psychiatric:        Mood and Affect: Mood normal.        Behavior: Behavior normal.        Thought Content: Thought content normal.        Judgment:  Judgment normal.    ED Results / Procedures / Treatments   Labs (all labs ordered are listed, but only abnormal results are displayed) Labs Reviewed  BASIC METABOLIC PANEL - Abnormal; Notable for the following components:      Result Value   Potassium 2.7 (*)    Glucose,  Bld 125 (*)    Calcium 8.0 (*)    All other components within normal limits  CBC - Abnormal; Notable for the following components:   Hemoglobin 11.8 (*)    All other components within normal limits  MAGNESIUM - Abnormal; Notable for the following components:   Magnesium 1.3 (*)    All other components within normal limits  RESP PANEL BY RT-PCR (FLU A&B, COVID) [id number]  URINALYSIS, ROUTINE W REFLEX MICROSCOPIC    EKG EKG Interpretation  Date/Time:  Monday December 01 2021 19:08:18 EST Ventricular Rate:  82 PR Interval:  135 QRS Duration: 100 QT Interval:  444 QTC Calculation: 519 R Axis:   77 Text Interpretation: Sinus rhythm Low voltage, precordial leads Nonspecific T abnormalities, lateral leads Prolonged QT interval Since last tracing QT has lengthened Otherwise no significant change Confirmed by Daleen Bo [phone]) on 12/01/2021 8:50:17 PM  Radiology DG Ribs Unilateral W/Chest Right  Result Date: 12/01/2021 CLINICAL DATA:  Recent syncopal episode with fall and right chest pain, initial encounter EXAM: RIGHT RIBS AND CHEST - 3+ VIEW COMPARISON:  None. FINDINGS: Cardiac shadow is within normal limits. Lungs are well aerated bilaterally. Postsurgical changes in the cervical spine are seen. No rib abnormality noted. IMPRESSION: No acute abnormality seen. Electronically Signed   By: Inez Catalina M.D.   On: 12/01/2021 22:22   DG Thoracic Spine 2 View  Result Date: 12/01/2021 CLINICAL DATA:  Fall. EXAM: THORACIC SPINE 2 VIEWS COMPARISON:  None. FINDINGS: There is no evidence of thoracic spine fracture. Alignment is normal. No other significant bone abnormalities are identified. C4-C5 anterior fusion plate is present. IMPRESSION: Negative. Electronically Signed   By: Ronney Asters M.D.   On: 12/01/2021 22:21    Procedures .Critical Care Performed by: Daleen Bo, MD Authorized by: Daleen Bo, MD   Critical care provider statement:    Critical care time (minutes):  40    Critical care start time:  12/01/2021 8:45 PM   Critical care end time:  12/02/2021 2:10 AM   Critical care time was exclusive of:  Separately billable procedures and treating other patients   Critical care was time spent personally by me on the following activities:  Blood draw for specimens, development of treatment plan with patient or surrogate, discussions with consultants, evaluation of patient's response to treatment, examination of patient, ordering and performing treatments and interventions, ordering and review of laboratory studies, ordering and review of radiographic studies, pulse oximetry, re-evaluation of patient's condition and review of old charts   Medications Ordered in ED Medications  HYDROmorphone (DILAUDID) injection 1 mg (1 mg Intravenous Given 12/01/21 2253)  potassium chloride 10 mEq in 100 mL IVPB (10 mEq Intravenous New Bag/Given 12/01/21 2351)  sodium chloride 0.9 % bolus 500 mL (0 mLs Intravenous Stopped 12/01/21 2252)  magnesium sulfate IVPB 2 g 50 mL (0 g Intravenous Stopped 12/01/21 2239)  sodium chloride 0.9 % bolus 1,000 mL (0 mLs Intravenous Stopped 12/02/21 0129)  sodium chloride 0.9 % bolus 1,000 mL (1,000 mLs Intravenous New Bag/Given 12/02/21 0052)  HYDROmorphone (DILAUDID) injection 1 mg (1 mg Intravenous Given 12/02/21 0125)    ED Course  I have reviewed the  triage vital signs and the nursing notes.  Pertinent labs & imaging results that were available during my care of the patient were reviewed by me and considered in my medical decision making (see chart for details).    MDM Rules/Calculators/A&P                            Patient Vitals for the past 24 hrs:  BP Temp Pulse Resp SpO2 Height Weight  12/02/21 0030 91/60 -- 79 13 95 % -- --  12/01/21 2200 106/72 -- 82 12 96 % -- --  12/01/21 2030 94/67 -- 85 18 97 % -- --  12/01/21 1900 (!) 87/64 -- 84 12 98 % -- --  12/01/21 1619 101/82 98.4 F (36.9 C) 91 18 99 % 5\' 4"  (1.626 m) 83.5 kg       Medical Decision Making:  This patient is presenting for evaluation of syncope with recurrent vomiting and diarrhea, which does require a range of treatment options, and is a complaint that involves a high risk of morbidity and mortality. The differential diagnoses include volume depletion, metabolic disorder, chronic intestinal disorder. I decided to review old records, and in summary middle-aged female presenting with recurrent symptoms, today complicated by syncope.  Recently had comprehensive hospitalization, including assessment of small bowel for obstruction/stricture.  She was discharged to follow-up with her GI physician and a surgeon regarding a chronic abdominal wall hernia.  She is on chronic pain medicine.  Her MME is very, 44.  I did not require additional historical information from anyone.  Clinical Laboratory Tests Ordered, included CBC, Metabolic panel, and magnesium, urinalysis, viral panel . Review indicates normal except magnesium low, potassium low, glucose high, calcium low, hemoglobin low. Radiologic Tests Ordered, included chest x-ray, thoracic spine.  I independently Visualized: Radiograph images, which show no acute injuries  Cardiac Monitor Tracing which shows normal sinus rhythm     Critical Interventions-clinical evaluation, laboratory testing, cardiac monitor, EKG, IV fluids, IV potassium and IV magnesium, observation and reassess  After These Interventions, the Patient was reevaluated and was found with significant metabolic disturbance, prolonged QT and syncope.  She has recurrent symptoms, likely causing the metabolic disorder.  She will require hospitalization for stabilization.  CRITICAL CARE-yes Performed by: Daleen Bo  Nursing Notes Reviewed/ Care Coordinated Applicable Imaging Reviewed Interpretation of Laboratory Data incorporated into ED treatment  Case discussed with hospitalist would like to have her blood pressure stabilized to normal  prior to admission.  We will continue to give IV fluids and have oncoming provider team contact the hospitalist later.    Final Clinical Impression(s) / ED Diagnoses Final diagnoses:  Hypokalemia  Hypomagnesemia    Rx / DC Orders ED Discharge Orders     None        Daleen Bo, MD 12/02/21 0210

## 2021-12-01 NOTE — Telephone Encounter (Signed)
Patient called today stating she is still having issues with nausea,vomiting. She states she had a hotdog two days ago and vomited it up yesterday. She was able to eat some noodles out of soup this am around 2 am and has kept it down she reports that she has passed out twice since she was released from the hospital once this am. She stats she can normally feel when she is about to pass out and she will lye down in the floor to prevent hurting herself. She states she did not realize this am she was going to pass out. I advised her the [employer] was not approved by her insurance, she states she was made aware of this by her insurance company, and states they advised her that this was an extremely dangerous drug. She has been under a lot of stress as her mother passed away recently. I advised that Dr. Jenetta Downer recommended that she return to the hospital. She states understanding and says that is what she will do.

## 2021-12-01 NOTE — ED Triage Notes (Signed)
Pt reports syncopal episode this am. Pt reports intermittent n/v-states she has history of motility issues. Pt c/o back pain after falling.

## 2021-12-02 ENCOUNTER — Encounter (HOSPITAL_COMMUNITY): Payer: Self-pay | Admitting: Internal Medicine

## 2021-12-02 ENCOUNTER — Inpatient Hospital Stay (HOSPITAL_COMMUNITY): Payer: Medicare Other | Attending: Hematology

## 2021-12-02 ENCOUNTER — Other Ambulatory Visit: Payer: Self-pay

## 2021-12-02 DIAGNOSIS — R112 Nausea with vomiting, unspecified: Secondary | ICD-10-CM | POA: Diagnosis not present

## 2021-12-02 DIAGNOSIS — R195 Other fecal abnormalities: Secondary | ICD-10-CM

## 2021-12-02 DIAGNOSIS — E538 Deficiency of other specified B group vitamins: Secondary | ICD-10-CM | POA: Insufficient documentation

## 2021-12-02 DIAGNOSIS — Z79899 Other long term (current) drug therapy: Secondary | ICD-10-CM | POA: Insufficient documentation

## 2021-12-02 DIAGNOSIS — D649 Anemia, unspecified: Secondary | ICD-10-CM | POA: Diagnosis present

## 2021-12-02 DIAGNOSIS — R55 Syncope and collapse: Secondary | ICD-10-CM | POA: Diagnosis not present

## 2021-12-02 DIAGNOSIS — E876 Hypokalemia: Secondary | ICD-10-CM

## 2021-12-02 DIAGNOSIS — D509 Iron deficiency anemia, unspecified: Secondary | ICD-10-CM | POA: Insufficient documentation

## 2021-12-02 LAB — OCCULT BLOOD X 1 CARD TO LAB, STOOL: Fecal Occult Bld: NEGATIVE

## 2021-12-02 LAB — RENAL FUNCTION PANEL
Albumin: 2.8 g/dL — ABNORMAL LOW (ref 3.5–5.0)
Anion gap: 9 (ref 5–15)
BUN: 9 mg/dL (ref 6–20)
CO2: 24 mmol/L (ref 22–32)
Calcium: 7.6 mg/dL — ABNORMAL LOW (ref 8.9–10.3)
Chloride: 105 mmol/L (ref 98–111)
Creatinine, Ser: 0.66 mg/dL (ref 0.44–1.00)
GFR, Estimated: >60 mL/min (ref >60)
Glucose, Bld: 107 mg/dL — ABNORMAL HIGH (ref 70–99)
Phosphorus: 2.6 mg/dL (ref 2.5–4.6)
Potassium: 3.1 mmol/L — ABNORMAL LOW (ref 3.5–5.1)
Sodium: 138 mmol/L (ref 135–145)

## 2021-12-02 MED ORDER — SODIUM CHLORIDE 0.9% FLUSH
3.0000 mL | Freq: Two times a day (BID) | INTRAVENOUS | Status: DC
  Administered 2021-12-03 – 2021-12-04 (×3): 3 mL via INTRAVENOUS

## 2021-12-02 MED ORDER — ACETAMINOPHEN 325 MG PO TABS: 650.0000 mg | ORAL_TABLET | Freq: Four times a day (QID) | ORAL | Status: DC | PRN

## 2021-12-02 MED ORDER — INSULIN ASPART 100 UNIT/ML IJ SOLN: 0.0000 [IU] | Freq: Three times a day (TID) | INTRAMUSCULAR | Status: DC

## 2021-12-02 MED ORDER — RIFAXIMIN 550 MG PO TABS
550.0000 mg | ORAL_TABLET | Freq: Three times a day (TID) | ORAL | Status: DC
  Administered 2021-12-02 – 2021-12-05 (×9): 550 mg via ORAL
  Filled 2021-12-02 (×9): qty 1

## 2021-12-02 MED ORDER — DULOXETINE HCL 60 MG PO CPEP
60.0000 mg | ORAL_CAPSULE | Freq: Every day | ORAL | Status: DC
  Administered 2021-12-02 – 2021-12-05 (×4): 60 mg via ORAL
  Filled 2021-12-02 (×3): qty 1
  Filled 2021-12-02: qty 2

## 2021-12-02 MED ORDER — SODIUM CHLORIDE 0.9 % IV SOLN: 250.0000 mL | INTRAVENOUS | Status: DC | PRN

## 2021-12-02 MED ORDER — PANTOPRAZOLE SODIUM 40 MG PO TBEC
40.0000 mg | DELAYED_RELEASE_TABLET | Freq: Two times a day (BID) | ORAL | Status: DC
  Administered 2021-12-02 – 2021-12-05 (×6): 40 mg via ORAL
  Filled 2021-12-02 (×6): qty 1

## 2021-12-02 MED ORDER — RIVAROXABAN 20 MG PO TABS
20.0000 mg | ORAL_TABLET | Freq: Every day | ORAL | Status: DC
  Administered 2021-12-02: 20 mg via ORAL
  Filled 2021-12-02: qty 1

## 2021-12-02 MED ORDER — HYDROMORPHONE HCL 1 MG/ML IJ SOLN
1.0000 mg | Freq: Once | INTRAMUSCULAR | Status: AC
  Administered 2021-12-02: 1 mg via INTRAVENOUS
  Filled 2021-12-02: qty 1

## 2021-12-02 MED ORDER — CYCLOBENZAPRINE HCL 10 MG PO TABS
10.0000 mg | ORAL_TABLET | Freq: Two times a day (BID) | ORAL | Status: DC | PRN
  Administered 2021-12-02 – 2021-12-04 (×3): 10 mg via ORAL
  Filled 2021-12-02 (×3): qty 1

## 2021-12-02 MED ORDER — SENNOSIDES-DOCUSATE SODIUM 8.6-50 MG PO TABS
2.0000 | ORAL_TABLET | Freq: Two times a day (BID) | ORAL | Status: DC
  Administered 2021-12-02 – 2021-12-05 (×5): 2 via ORAL
  Filled 2021-12-02 (×6): qty 2

## 2021-12-02 MED ORDER — SODIUM CHLORIDE 0.9 % IV BOLUS
1000.0000 mL | Freq: Once | INTRAVENOUS | Status: AC
  Administered 2021-12-02: 1000 mL via INTRAVENOUS

## 2021-12-02 MED ORDER — PRAVASTATIN SODIUM 40 MG PO TABS
40.0000 mg | ORAL_TABLET | Freq: Every evening | ORAL | Status: DC
  Administered 2021-12-02 – 2021-12-04 (×3): 40 mg via ORAL
  Filled 2021-12-02 (×4): qty 1

## 2021-12-02 MED ORDER — SODIUM CHLORIDE 0.9% FLUSH: 3.0000 mL | INTRAVENOUS | Status: DC | PRN

## 2021-12-02 MED ORDER — SUCRALFATE 1 G PO TABS
1.0000 g | ORAL_TABLET | Freq: Three times a day (TID) | ORAL | Status: DC
  Administered 2021-12-02 – 2021-12-05 (×12): 1 g via ORAL
  Filled 2021-12-02 (×11): qty 1

## 2021-12-02 MED ORDER — HYDROMORPHONE HCL 1 MG/ML IJ SOLN
INTRAMUSCULAR | Status: AC
  Filled 2021-12-02: qty 1

## 2021-12-02 MED ORDER — LEVOTHYROXINE SODIUM 100 MCG PO TABS
125.0000 ug | ORAL_TABLET | Freq: Every day | ORAL | Status: DC
  Administered 2021-12-03 – 2021-12-05 (×3): 125 ug via ORAL
  Filled 2021-12-02 (×3): qty 1

## 2021-12-02 MED ORDER — SODIUM CHLORIDE 0.9% FLUSH
3.0000 mL | Freq: Two times a day (BID) | INTRAVENOUS | Status: DC
  Administered 2021-12-02 – 2021-12-05 (×4): 3 mL via INTRAVENOUS

## 2021-12-02 MED ORDER — CYCLOSPORINE 0.05 % OP EMUL: 1.0000 [drp] | Freq: Two times a day (BID) | OPHTHALMIC | Status: DC | PRN

## 2021-12-02 MED ORDER — ONDANSETRON HCL 4 MG/2ML IJ SOLN
4.0000 mg | Freq: Four times a day (QID) | INTRAMUSCULAR | Status: DC | PRN
  Administered 2021-12-02 – 2021-12-04 (×3): 4 mg via INTRAVENOUS
  Filled 2021-12-02 (×3): qty 2

## 2021-12-02 MED ORDER — ERYTHROMYCIN BASE 250 MG PO TABS
250.0000 mg | ORAL_TABLET | Freq: Three times a day (TID) | ORAL | Status: DC
  Administered 2021-12-02 – 2021-12-05 (×9): 250 mg via ORAL
  Filled 2021-12-02 (×11): qty 1

## 2021-12-02 MED ORDER — LACTATED RINGERS IV BOLUS
1000.0000 mL | Freq: Once | INTRAVENOUS | Status: AC
  Administered 2021-12-02: 1000 mL via INTRAVENOUS

## 2021-12-02 MED ORDER — MECLIZINE HCL 12.5 MG PO TABS
25.0000 mg | ORAL_TABLET | Freq: Three times a day (TID) | ORAL | Status: DC | PRN
  Filled 2021-12-02: qty 2

## 2021-12-02 MED ORDER — POTASSIUM CHLORIDE CRYS ER 20 MEQ PO TBCR
40.0000 meq | EXTENDED_RELEASE_TABLET | Freq: Once | ORAL | Status: DC
  Filled 2021-12-02 (×2): qty 2

## 2021-12-02 MED ORDER — POTASSIUM CHLORIDE CRYS ER 20 MEQ PO TBCR
40.0000 meq | EXTENDED_RELEASE_TABLET | Freq: Once | ORAL | Status: AC
  Administered 2021-12-02: 40 meq via ORAL

## 2021-12-02 MED ORDER — ASPIRIN EC 81 MG PO TBEC
81.0000 mg | DELAYED_RELEASE_TABLET | Freq: Every day | ORAL | Status: DC
  Administered 2021-12-03 – 2021-12-05 (×3): 81 mg via ORAL
  Filled 2021-12-02 (×3): qty 1

## 2021-12-02 MED ORDER — EZETIMIBE 10 MG PO TABS
10.0000 mg | ORAL_TABLET | Freq: Every day | ORAL | Status: DC
  Administered 2021-12-03 – 2021-12-05 (×3): 10 mg via ORAL
  Filled 2021-12-02 (×3): qty 1

## 2021-12-02 MED ORDER — TRAZODONE HCL 50 MG PO TABS: 50.0000 mg | ORAL_TABLET | Freq: Every evening | ORAL | Status: DC | PRN

## 2021-12-02 MED ORDER — PANTOPRAZOLE SODIUM 40 MG PO TBEC
40.0000 mg | DELAYED_RELEASE_TABLET | Freq: Every day | ORAL | Status: DC
  Administered 2021-12-02: 40 mg via ORAL
  Filled 2021-12-02: qty 1

## 2021-12-02 MED ORDER — ACETAMINOPHEN 650 MG RE SUPP: 650.0000 mg | Freq: Four times a day (QID) | RECTAL | Status: DC | PRN

## 2021-12-02 MED ORDER — HYDROMORPHONE HCL 2 MG PO TABS
2.0000 mg | ORAL_TABLET | ORAL | Status: DC | PRN
  Administered 2021-12-02 – 2021-12-05 (×11): 2 mg via ORAL
  Filled 2021-12-02 (×11): qty 1

## 2021-12-02 MED ORDER — ALPRAZOLAM 0.5 MG PO TABS
0.5000 mg | ORAL_TABLET | Freq: Three times a day (TID) | ORAL | Status: DC | PRN
  Administered 2021-12-02 – 2021-12-04 (×3): 0.5 mg via ORAL
  Filled 2021-12-02 (×3): qty 1

## 2021-12-02 MED ORDER — MAGNESIUM OXIDE -MG SUPPLEMENT 400 (240 MG) MG PO TABS
400.0000 mg | ORAL_TABLET | Freq: Two times a day (BID) | ORAL | Status: DC
  Administered 2021-12-02 – 2021-12-05 (×6): 400 mg via ORAL
  Filled 2021-12-02 (×7): qty 1

## 2021-12-02 MED ORDER — POLYETHYLENE GLYCOL 3350 17 G PO PACK
17.0000 g | PACK | Freq: Every day | ORAL | Status: DC
  Administered 2021-12-02 – 2021-12-05 (×2): 17 g via ORAL
  Filled 2021-12-02 (×2): qty 1

## 2021-12-02 MED ORDER — ONDANSETRON HCL 4 MG PO TABS: 4.0000 mg | ORAL_TABLET | Freq: Four times a day (QID) | ORAL | Status: DC | PRN

## 2021-12-02 MED ORDER — POLYETHYLENE GLYCOL 3350 17 G PO PACK: 17.0000 g | PACK | Freq: Every day | ORAL | Status: DC | PRN

## 2021-12-02 MED ORDER — BISACODYL 10 MG RE SUPP: 10.0000 mg | Freq: Every day | RECTAL | Status: DC | PRN

## 2021-12-02 MED ORDER — INSULIN ASPART 100 UNIT/ML IJ SOLN: 0.0000 [IU] | Freq: Every day | INTRAMUSCULAR | Status: DC

## 2021-12-02 MED ORDER — MAGNESIUM SULFATE 2 GM/50ML IV SOLN
2.0000 g | Freq: Once | INTRAVENOUS | Status: AC
  Administered 2021-12-02: 2 g via INTRAVENOUS

## 2021-12-02 MED ORDER — POTASSIUM CHLORIDE CRYS ER 20 MEQ PO TBCR
40.0000 meq | EXTENDED_RELEASE_TABLET | ORAL | Status: AC
  Administered 2021-12-02: 40 meq via ORAL
  Filled 2021-12-02 (×2): qty 2

## 2021-12-02 MED ORDER — ESTROGENS, CONJUGATED 0.625 MG/GM VA CREA: 1.0000 | TOPICAL_CREAM | Freq: Every day | VAGINAL | Status: DC

## 2021-12-02 MED ORDER — SODIUM CHLORIDE 0.9 % IV BOLUS
500.0000 mL | Freq: Once | INTRAVENOUS | Status: AC
  Administered 2021-12-02: 500 mL via INTRAVENOUS

## 2021-12-02 MED ORDER — OCUVITE-LUTEIN PO CAPS
1.0000 | ORAL_CAPSULE | Freq: Every day | ORAL | Status: DC
  Administered 2021-12-04: 1 via ORAL
  Filled 2021-12-02 (×2): qty 1

## 2021-12-02 NOTE — H&P (View-Only) (Signed)
@LOGO @   Referring Provider: [facility]  Primary Care Physician:  Fayrene Helper, MD Primary Gastroenterologist:  Dr. Jenetta Downer  Date of Admission: 12/01/21 Date of Consultation: 12/02/21  Reason for Consultation:  GI dysmotility, nausea, vomiting  HPI:  Omari Koslosky is a 60 y.o. year old female with multiple medical issues, complicated surgical hx of partial colectomy due to redundant colon w/ primary anastomosis, perforated bowel due to gynelogic procedure complicated by enterocutaneous fistula that required multiple surgeries and multiple SBOs, and recent admission on 10/29 for partial SBO and treated with bowel rest, admitted again in November due to ongoing nausea, vomiting, diarrhea, and abdominal pain, thought to have GI pseudoobstruction, possibly gastroparesis, and small bowel dysmotility, but stricture remains in the differential in light of multiple surgeries.  She was discharged on erythromycin as she was intolerant to Reglan, Xifaxan 3 times daily, senna twice daily, Carafate 3 times daily.  She returned to the emergency room yesterday following a syncopal episode and ongoing nausea/vomiting.  ED course: Hypotensive with BP as low as 83/58, otherwise vital signs within normal limits. Labs remarkable for hemoglobin 11.8 (L), potassium 2.7 (L), magnesium 1.3 (L), calcium 8.0 (L), glucose 125 (H).  Thoracic spine x-ray negative.  Unilateral right chest x-ray negative. She received IV pain medications, IV fluids, magnesium, potassium in the ED.   Consult:  Not sure why she is passing out. She has passed out twice, first in October and again yesterday. Not sure why. States it was sudden and didn't know it was happening. Prior, she has had some symptoms of feeling like she was going to pas out and would lay down on the floor. Her hands draw up some when this occurs.   Has been having intermittent nausea and vomiting. Some days she will be fine. Other days she will  have vomiting of foods she ate 1-2 days prior. When eating soup or yogurt, she does much better, but anything solid, she will vomit back up.  Feels like she is starving, but eats and gets full super quickly. GERD is well controlled on omeprazole BID. Mild dysphagia to pills, dry foods, breads, rice.  Had some improvement with last dilation, but feels this is returning. Reports at the time of her last EGD, this is when all of her symptoms were just starting.    Reports her diarrhea stopped after she left the hospital.  Chronically, she struggles with constipation and takes 2 stool softeners with a laxative in the morning and night.  When bowels move, they are typically mushy to watery.  Since her hospitalization, bowels have been fluctuating from BM daily, then maybe a small BM, or may skip a day.  If her bowels do not move well, she will take 2 additional laxatives which usually results in a good bowel movement and allows her to feel better.  Good BM yesterday in the ED yesterday. Had a small BM this today. Doesn't feel she has a blockage. No worsening abdominal pain. Her pain is at baseline. Not like before.   Some times black stools. Started back in November. Has noticed this 3 more times since hospital discharge. Single episode of bright red blood in her stool in November, but reports she does have hemorrhoids.  No NSAIDs. Last dose of Xarelto was Monday morning.   Last EGD 05/02/2021: No endoscopic esophageal abnormality to explain dysphagia s/p dilation and biopsied, single gastric polyp resected, normal examined duodenum biopsied. Pathology revealed benign small bowel biopsy, hyperplastic gastric polyp, benign  esophageal biopsies.  Last colonoscopy 05/02/2021: Fair prep, normal ileum, patent side-to-side ileocolonic anastomosis, torsion and anastomosis, normal mucosa biopsied, nonbleeding internal hemorrhoids.  Pathology with benign colonic mucosa, mild melanosis coli.   Past Medical History:   Diagnosis Date   Abdominal hernia 08/14/2019   Abdominal pain 06/06/2019   Acute bronchitis 05/01/2013   Acute cystitis 06/09/2010   Qualifier: Diagnosis of  By: Cori Razor LPN, Brandi     Acute renal insufficiency 08/28/2011   Allergy    Phreesia 03/16/2021   Anemia    Phreesia 10/29/2020   Anxiety    Anxiety state 03/16/2008   Qualifier: Diagnosis of  By: Dierdre Harness     Arthritis    Phreesia 10/29/2020   Back pain with radiation 07/17/2014   Bilateral hand pain 07/27/2016   Blood transfusion without reported diagnosis    Phreesia 10/29/2020   Breast cancer ([place])    L breast- 2006   Cancer ([place])    Phreesia 10/29/2020   Carpal tunnel syndrome    Chronic constipation    Chronic pain syndrome    followed by [facility]---  back   Clotting disorder ([place])    Phreesia 10/29/2020   Depression    Dermatitis 12/15/2011   Difficult intubation    Educated about COVID-19 virus infection 05/14/2019   Fall at home 01/26/2016   Family history of adverse reaction to anesthesia    MOTHER--- PONV   Fatigue 09/17/2012   Fibromyalgia    FIBROMYALGIA 03/16/2008   Qualifier: Diagnosis of  By: Dierdre Harness     GERD (gastroesophageal reflux disease)    Headache disorder 01/16/2013   Headache syndrome 01/26/2016   Hip pain, right 07/08/2019   History of MRSA infection    lip abscess   History of ovarian cyst 06/2011   s/p  BSO   History of pulmonary embolus (PE) 1997   post EP with ablation pulmonary veouns for SVT/ Atrial Fib.   History of supraventricular tachycardia    s/p  ablation 1996  and 1997  by dr Caryl Comes   History of TIA (transient ischemic attack) 1997   post op EP ablation PE   Hyperlipidemia    Hyperlipidemia LDL goal <100 03/16/2008   Qualifier: Diagnosis of  By: Dierdre Harness     Hypothyroidism    followed by pcp   Incisional hernia    Insomnia 12/15/2011   Intermittent palpitations 08/22/2017   Interstitial cystitis    09-13-2018   per pt last flare-up  May 2019 (followed  by pcp)   INTERSTITIAL CYSTITIS 02/17/2011   Qualifier: Diagnosis of  By: Moshe Cipro MD, Margaret     Iron deficiency anemia    09-13-2018  PER PT STABLE   Irregular heart rate 07/25/2013   Lipoma of back    upper   Low back pain radiating to right leg 07/04/2019   Low ferritin level 06/14/2014   MDD (major depressive disorder), single episode, in full remission ([place]) 10/27/2017   Medically noncompliant 02/28/2015   Multiple missed appointments, both follow-up appointments and lab appointments.    Metabolic syndrome X 9/51/8841   Qualifier: Diagnosis of  By: Moshe Cipro MD, Margaret  hBA1c is 5.8 in 02/2013    Migraines    Morbid obesity ([place]) 03/27/2013   Muscle spasm 01/03/2020   Nausea alone 07/17/2014   NECK PAIN, CHRONIC 03/16/2008   Qualifier: Diagnosis of  By: Dierdre Harness     Normal coronary arteries    a. by CT 12/2018.  Obesity 03/16/2008   Qualifier: Diagnosis of  By: Dierdre Harness     Oral ulceration 08/23/2014   Presented at 06/14/2014 visit    Other malaise and fatigue 03/16/2008   Centricity Description: FATIGUE, CHRONIC Qualifier: Diagnosis of  By: Dierdre Harness   Centricity Description: FATIGUE Qualifier: Diagnosis of  By: Claybon Jabs PA, Dawn     OVARIAN CYST 12/26/2009   Qualifier: Diagnosis of  By: Moshe Cipro MD, Margaret     Paget disease of breast, left [facility])    Paget's disease of breast, left ([place]) 03/16/2008   Qualifier: Diagnosis of  By: Dierdre Harness  Left diagnosed in 2006 F/h breast cancer x 15 family members   Partial small bowel obstruction ([place]) 07/04/2012   PONV (postoperative nausea and vomiting)    SEVERE   Postsurgical menopause 11/13/2011   Presence of IVC filter 06/06/2019   PSVT (paroxysmal supraventricular tachycardia) ([place])    [place]   Recurrent oral herpes simplex infection 10/13/2017   ROM (right otitis media) 01/23/2013   S/P insertion of IVC (inferior vena caval) filter 05/08/2005   [place] (non-retrievable)  /  dx 2019  a leg of filter  is protruding thru the vena cava in to right L2 vertebral body (09-13-2018  per pt having surgery to remove filter in [place])   S/P radiofrequency ablation operation for arrhythmia 1996   1996  and 1997,   SVT and Atrial Fib   Sinusitis, chronic 01/26/2016   SMALL BOWEL OBSTRUCTION, HX OF 08/07/2008   Annotation: obstruction w/ adhesions led to partial colectomy Qualifier: Diagnosis of  By: Craige Cotta     Stroke [facility])    [place] 10/29/2020, TIAs in the past   Swelling of hand 09/17/2012   Syncope    Tachycardia 02/03/2010   Qualifier: Diagnosis of  By: Via LPN, Lynn     Thyroid disease    Phreesia 10/29/2020   Ulcer 12/15/2011   Urinary frequency 07/18/2014   Vaginitis and vulvovaginitis 05/10/2013   Vitamin D deficiency 05/05/2015   Wears glasses     Past Surgical History:  Procedure Laterality Date   ABDOMINAL HYSTERECTOMY  1987   ANTERIOR CERVICAL DECOMP/DISCECTOMY FUSION  03-07-2002   dr elsner  @[facility]    C 4 -- 5   APPENDECTOMY  1980   AUGMENTATION MAMMAPLASTY Right 2006   BILATERAL SALPINGOOPHORECTOMY  07/24/2011   via Explor. Lap. w/ intraoperative perf. bowel repair   BIOPSY  05/02/2021   Procedure: BIOPSY;  Surgeon: Montez Morita, Quillian Quince, MD;  [facility];  Service: Gastroenterology;;  small bowel, mid esophagus, distal esophagus, random colon biopsies   BREAST BIOPSY Right 2019   benign   BREAST ENHANCEMENT SURGERY Bilateral 1993   BREAST IMPLANT REMOVAL Bilateral    BREAST SURGERY N/A    Phreesia 10/29/2020   CARDIAC CATHETERIZATION  07-06-2003   dr Darnell Level brodie   normal coronaries and LVF   CARDIAC ELECTROPHYSIOLOGY STUDY AND ABLATION  1996  and 1997   CARDIOVASCULAR STRESS TEST  11-18-2015   dr Caryl Comes   normal nuclear study w/ no ischemia/  normal LV function and wall motion , ef 84%   CARPAL TUNNEL RELEASE Right ?   CESAREAN SECTION  1986   CESAREAN SECTION N/A    Phreesia 10/29/2020   CHOLECYSTECTOMY N/A    Phreesia 10/29/2020   COLON SURGERY      COLONOSCOPY WITH PROPOFOL N/A 05/02/2021   Procedure: COLONOSCOPY WITH PROPOFOL;  Surgeon: Harvel Quale, MD;  [facility]  [facility];  Service: Gastroenterology;  Laterality: N/A;  12:30 PM   CYSTO/  HYDRODISTENTION/  INSTILATION THERAPY  MULTIPLE   ENTEROCUTANEOUS FISTULA CLOSURE  multiple   last one 2015 with small bowel resection   ESOPHAGOGASTRODUODENOSCOPY (EGD) WITH PROPOFOL N/A 05/02/2021   Procedure: ESOPHAGOGASTRODUODENOSCOPY (EGD) WITH PROPOFOL;  Surgeon: Harvel Quale, MD;  [facility];  Service: Gastroenterology;  Laterality: N/A;   EXPLORATORY LAPAROTOMY INCISIONAL VENTRAL HERNIA REPAIR / RESECTION SMALL BOWEL  11-09-2014   @[facility]    FRACTURE SURGERY N/A    Phreesia 10/29/2020   JOINT REPLACEMENT N/A    Phreesia 10/29/2020   LIPOMA EXCISION Right 09/16/2018   Procedure: EXCISION LIPOMA UPPER BACK;  Surgeon: Clovis Riley, MD;  Location: [facility];  Service: General;  Laterality: Right;   MASTECTOMY Left 2006   w/ reconstruction on left (paget's disease)  and right breast augmentation   POLYPECTOMY  05/02/2021   Procedure: POLYPECTOMY;  Surgeon: Harvel Quale, MD;  [facility];  Service: Gastroenterology;;  gastric   SAVORY DILATION  05/02/2021   Procedure: Azzie Almas DILATION;  Surgeon: Harvel Quale, MD;  [facility];  Service: Gastroenterology;;   SMALL INTESTINE SURGERY N/A    Phreesia 10/29/2020   SPINE SURGERY N/A    Phreesia 10/29/2020   TOTAL COLECTOMY  08-04-2002    @[facility]    AND CHOLECYSTECTOMY  (colonic inertia)   TRANSTHORACIC ECHOCARDIOGRAM  02/04/2016   ef 60-65%,  grade 2 diastolic dysfunction/  mild MR   TUBAL LIGATION N/A    Phreesia 03/16/2021   VENA CAVA FILTER PLACEMENT  05/08/2005   @[facility]    [place] (non-retrievable)   WIDE EXCISION PERIRECTAL ABSCESSES  09-22-2005   @ [facility]    Prior to Admission medications   Medication Sig Start Date End Date Taking?  Authorizing Provider  ALPRAZolam [name]) 1 MG tablet Take 1 tablet (1 mg total) by mouth 2 (two) times daily. 08/26/21  Yes Fayrene Helper, MD  aspirin 81 MG chewable tablet Chew 81 mg by mouth daily.   Yes [provider]  butalbital-acetaminophen-caffeine (FIORICET) 50-325-40 MG tablet Take 1 tablet by mouth as directed. 06/19/19  Yes [provider]  conjugated estrogens (PREMARIN) vaginal cream Place fingertip amount intravaginally 2 to 3 times per week Patient taking differently: Place 1 mg vaginally at bedtime. Place fingertip amount intravaginally 2 to 3 times per week 07/12/20  Yes Fayrene Helper, MD  cyclobenzaprine (FLEXERIL) 10 MG tablet TAKE 1 TABLET BY MOUTH TWICE DAILY AS NEEDED FOR MUSCLE SPASMS 02/21/20  Yes Fayrene Helper, MD  cycloSPORINE (RESTASIS) 0.05 % ophthalmic emulsion Place 1 drop into both eyes 2 (two) times daily as needed (dry eyes).   Yes [provider]  DULoxetine (CYMBALTA) 60 MG capsule Take 60 mg by mouth daily. 04/10/21  Yes [provider]  Erythromycin 250 MG TBEC Take 250 mg by mouth 3 (three) times daily before meals. 11/18/21  Yes Kathie Dike, MD  ezetimibe (ZETIA) 10 MG tablet Take 1 tablet (10 mg total) by mouth daily. 09/28/21  Yes Fayrene Helper, MD  fluticasone (FLONASE) 50 MCG/ACT nasal spray Place 2 sprays into both nostrils daily. Patient taking differently: Place 2 sprays into both nostrils daily as needed for allergies. 10/31/20  Yes Fayrene Helper, MD  HYDROmorphone (DILAUDID) 4 MG tablet Take 4 mg by mouth in the morning, at noon, and at bedtime.   Yes [provider]  levothyroxine (SYNTHROID) 125  MCG tablet TAKE 1 TABLET(125 MCG) BY MOUTH DAILY BEFORE BREAKFAST 08/26/21  Yes Fayrene Helper, MD  magnesium oxide (MAG-OX) 400 (240 Mg) MG tablet Take 1 tablet (400 mg total) by mouth 2 (two) times daily. Please make overdue appt with Dr. Caryl Comes before anymore refills. Thank you 1st  attempt Patient taking differently: Take 400 mg by mouth daily. Please make overdue appt with Dr. Caryl Comes before anymore refills. Thank you 1st attempt 09/03/21  Yes Deboraha Sprang, MD  meclizine (ANTIVERT) 25 MG tablet Take 1 tablet (25 mg total) by mouth 3 (three) times daily as needed for dizziness. 10/31/20  Yes Fayrene Helper, MD  omeprazole (PRILOSEC) 40 MG capsule Take 1 capsule (40 mg total) by mouth in the morning and at bedtime. 11/18/21  Yes Kathie Dike, MD  pravastatin (PRAVACHOL) 40 MG tablet TAKE 1 TABLET(40 MG) BY MOUTH DAILY Patient taking differently: Take 40 mg by mouth daily. TAKE 1 TABLET(40 MG) BY MOUTH DAILY 01/15/21  Yes Fayrene Helper, MD  senna-docusate (SENOKOT-S) 8.6-50 MG per tablet Take 2 tablets by mouth 2 (two) times daily. constipation   Yes [provider]  sucralfate (CARAFATE) 1 g tablet Take 1 tablet (1 g total) by mouth 4 (four) times daily -  with meals and at bedtime. 11/18/21  Yes Memon, Jolaine Artist, MD  XARELTO 20 MG TABS tablet TAKE 1 TABLET(20 MG) BY MOUTH DAILY Patient taking differently: Take 20 mg by mouth daily with supper. 10/03/21  Yes Lindell Spar, MD  cyanocobalamin (,VITAMIN B-12,) 1000 MCG/ML injection Inject 1 mL (1,000 mcg total) into the muscle every 30 (thirty) days. 09/18/21   Harriett Rush, PA-C  metoprolol tartrate (LOPRESSOR) 25 MG tablet Take 1 tablet (25 mg total) by mouth 2 (two) times daily. Patient not taking: Reported on 12/02/2021 01/15/21   Fayrene Helper, MD  Multiple Vitamins-Minerals (ICAPS AREDS 2) CAPS Take 1 capsule by mouth at bedtime. Patient not taking: Reported on 12/02/2021    [provider]  predniSONE (DELTASONE) 10 MG tablet Take 40mg  po daily for 2 days then 30mg  daily for 2 days then 20mg  daily for 2 days then 10mg  daily for 2 days then stop Patient not taking: Reported on 12/02/2021 11/18/21   Kathie Dike, MD  promethazine (PHENERGAN) 25 MG tablet Take by mouth. Patient not  taking: Reported on 12/02/2021 07/29/18   [provider]  Prucalopride Succinate (MOTEGRITY) 1 MG TABS Take 1 mg by mouth daily at 2 PM. Patient not taking: Reported on 12/02/2021 11/27/21   Harvel Quale, MD  rifaximin (XIFAXAN) 550 MG TABS tablet Take 1 tablet (550 mg total) by mouth 3 (three) times daily. Patient not taking: Reported on 12/02/2021 11/18/21   Kathie Dike, MD  Specialty Vitamins Products (VITAMINS FOR HAIR PO) Take 1 capsule by mouth daily. Patient not taking: Reported on 12/02/2021    [provider]  vitamin C (ASCORBIC ACID) 500 MG tablet Take 500 mg by mouth at bedtime. Patient not taking: Reported on 12/02/2021    [provider]    Current Facility-Administered Medications  Medication Dose Route Frequency Provider Last Rate Last Admin   0.9 %  sodium chloride infusion  250 mL Intravenous PRN Emokpae, Courage, MD       acetaminophen (TYLENOL) tablet 650 mg  650 mg Oral Q6H PRN Emokpae, Courage, MD       Or   acetaminophen (TYLENOL) suppository 650 mg  650 mg Rectal Q6H PRN Roxan Hockey, MD  ALPRAZolam (XANAX) tablet 0.5 mg  0.5 mg Oral TID PRN Roxan Hockey, MD       [START ON 12/03/2021] aspirin EC tablet 81 mg  81 mg Oral Q breakfast Emokpae, Courage, MD       bisacodyl (DULCOLAX) suppository 10 mg  10 mg Rectal Daily PRN Emokpae, Courage, MD       cyclobenzaprine (FLEXERIL) tablet 10 mg  10 mg Oral BID PRN Emokpae, Courage, MD       cycloSPORINE (RESTASIS) 0.05 % ophthalmic emulsion 1 drop  1 drop Both Eyes BID PRN Emokpae, Courage, MD       DULoxetine (CYMBALTA) DR capsule 60 mg  60 mg Oral Daily Emokpae, Courage, MD   60 mg at 12/02/21 1328   erythromycin (E-MYCIN) tablet 250 mg  250 mg Oral TID AC Emokpae, Courage, MD       ezetimibe (ZETIA) tablet 10 mg  10 mg Oral Daily Emokpae, Courage, MD       HYDROmorphone (DILAUDID) tablet 2 mg  2 mg Oral Q4H PRN Denton Brick, Courage, MD   2 mg at 12/02/21 1329   [START ON  12/03/2021] levothyroxine (SYNTHROID) tablet 125 mcg  125 mcg Oral Q0600 Emokpae, Courage, MD       magnesium oxide (MAG-OX) tablet 400 mg  400 mg Oral BID Emokpae, Courage, MD       meclizine (ANTIVERT) tablet 25 mg  25 mg Oral TID PRN Emokpae, Courage, MD       multivitamin-lutein (OCUVITE-LUTEIN) capsule 1 capsule  1 capsule Oral QHS Emokpae, Courage, MD       ondansetron (ZOFRAN) tablet 4 mg  4 mg Oral Q6H PRN Emokpae, Courage, MD       Or   ondansetron (ZOFRAN) injection 4 mg  4 mg Intravenous Q6H PRN Emokpae, Courage, MD       pantoprazole (PROTONIX) EC tablet 40 mg  40 mg Oral Daily Emokpae, Courage, MD   40 mg at 12/02/21 1330   polyethylene glycol (MIRALAX / GLYCOLAX) packet 17 g  17 g Oral Daily PRN Emokpae, Courage, MD       potassium chloride SA (KLOR-CON M) CR tablet 40 mEq  40 mEq Oral Once Emokpae, Courage, MD       pravastatin (PRAVACHOL) tablet 40 mg  40 mg Oral QPM Emokpae, Courage, MD       rifaximin (XIFAXAN) tablet 550 mg  550 mg Oral TID Denton Brick, Courage, MD       rivaroxaban (XARELTO) tablet 20 mg  20 mg Oral Q supper Emokpae, Courage, MD       senna-docusate (Senokot-S) tablet 2 tablet  2 tablet Oral BID Emokpae, Courage, MD       sodium chloride flush (NS) 0.9 % injection 3 mL  3 mL Intravenous Q12H Emokpae, Courage, MD   3 mL at 12/02/21 1338   sodium chloride flush (NS) 0.9 % injection 3 mL  3 mL Intravenous Q12H Emokpae, Courage, MD       sodium chloride flush (NS) 0.9 % injection 3 mL  3 mL Intravenous PRN Emokpae, Courage, MD       sucralfate (CARAFATE) tablet 1 g  1 g Oral TID WC & HS Emokpae, Courage, MD       traZODone (DESYREL) tablet 50 mg  50 mg Oral QHS PRN Roxan Hockey, MD        Allergies as of 12/01/2021 - Review Complete 12/01/2021  Allergen Reaction Noted   Nitrofurantoin Hives 03/16/2008   Crestor [rosuvastatin calcium]  Other (See Comments) 08/08/2019   Bisacodyl Other (See Comments) 07/03/2013   Clarithromycin Hives and Other (See Comments)  09/22/2010   Clarithromycin Other (See Comments) 08/29/2011   Clindamycin hcl Hives 08/29/2011   Codeine Itching 03/16/2008   Iron sucrose Other (See Comments) 03/10/2021   Monosodium glutamate Other (See Comments) 06/26/2014   Scopolamine hbr Other (See Comments) 08/29/2011    Family History  Problem Relation Age of Onset   Diabetes Mother    Heart disease Mother    Hypertension Mother    Heart disease Father    Hyperlipidemia Father    Hypertension Father    Alcohol abuse Father    Colon cancer Maternal Aunt    Breast cancer Maternal Aunt    Cancer Maternal Uncle        mets   Bone cancer Maternal Grandfather        mets   Ovarian cancer Cousin 19   Breast cancer Cousin    Prostate cancer Maternal Uncle    Breast cancer Maternal Aunt    Brain cancer Maternal Aunt    Breast cancer Maternal Aunt     Social History   Socioeconomic History   Marital status: Married    Spouse name: Not on file   Number of children: Not on file   Years of education: Not on file   Highest education level: Not on file  Occupational History   Not on file  Tobacco Use   Smoking status: Never   Smokeless tobacco: Never  Vaping Use   Vaping Use: Never used  Substance and Sexual Activity   Alcohol use: No    Comment: twice per year   Drug use: No   Sexual activity: Yes    Birth control/protection: Surgical  Other Topics Concern   Not on file  Social History Narrative   Not on file   Social Determinants of Health   Financial Resource Strain: Medium Risk   Difficulty of Paying Living Expenses: Somewhat hard  Food Insecurity: No Food Insecurity   Worried About Charity fundraiser in the Last Year: Never true   [place] in the Last Year: Never true  Transportation Needs: No Transportation Needs   Lack of Transportation (Medical): No   Lack of Transportation (Non-Medical): No  Physical Activity: Insufficiently Active   Days of Exercise per Week: 7 days   Minutes of  Exercise per Session: 20 min  Stress: No Stress Concern Present   Feeling of Stress : Not at all  Social Connections: Moderately Isolated   Frequency of Communication with Friends and Family: More than three times a week   Frequency of Social Gatherings with Friends and Family: More than three times a week   Attends Religious Services: Never   Marine scientist or Organizations: No   Attends Music therapist: Never   Marital Status: Married  Human resources officer Violence: Not At Risk   Fear of Current or Ex-Partner: No   Emotionally Abused: No   Physically Abused: No   Sexually Abused: No    Review of Systems: Gen: Denies fever, chills, cold or flulike symptoms. CV: Denies chest pain, heart palpitations. Resp: Denies shortness of breath or cough. GI: See HPI GU : Denies urinary burning, urinary frequency, urinary incontinence.  Derm: Denies rash Psych: Denies depression, anxiety Heme: See HPI  Physical Exam: Vital signs in last 24 hours: Temp:  [98.4 F (36.9 C)-98.7 F (37.1 C)] 98.7 F (37.1 C) (12/06  1433) Pulse Rate:  [70-91] 78 (12/06 1433) Resp:  [12-20] 20 (12/06 1433) BP: (83-128)/(51-82) 128/52 (12/06 1433) SpO2:  [95 %-100 %] 100 % (12/06 1433) Weight:  [83.5 kg] 83.5 kg (12/05 1619) Last BM Date: 12/01/21 General:   Alert,  Well-developed, well-nourished, pleasant and cooperative in NAD Head:  Normocephalic and atraumatic. Eyes:  Sclera clear, no icterus.   Conjunctiva pink. Ears:  Normal auditory acuity. Lungs:  Clear throughout to auscultation.   No wheezes, crackles, or rhonchi. No acute distress. Heart:  Regular rate and rhythm; no murmurs, clicks, rubs,  or gallops. Abdomen:  Bowel sounds present, soft, non-distended. Moderate generalized TTP (about the same as yesterday per patient), ventral midline hernia present with tenderness but soft and reducible (chronic), no rebound or guarding.  No hepatosplenomegaly. Rectal:  Deferred until time  of colonoscopy.   Msk:  Symmetrical without gross deformities. Normal posture. Extremities:  Without edema. Neurologic:  Alert and  oriented x4;  grossly normal neurologically. Skin:  Intact without significant lesions or rashes. Psych: Normal mood and affect.  Intake/Output from previous day: No intake/output data recorded. Intake/Output this shift: Total I/O In: 1549.3 [IV Piggyback:1549.3] Out: -   Lab Results: Recent Labs    12/01/21 1650  WBC 8.8  HGB 11.8*  HCT 36.0  PLT 212   BMET Recent Labs    12/01/21 1650  NA 136  K 2.7*  CL 99  CO2 31  GLUCOSE 125*  BUN 17  CREATININE 0.91  CALCIUM 8.0*    Studies/Results: DG Ribs Unilateral W/Chest Right  Result Date: 12/01/2021 CLINICAL DATA:  Recent syncopal episode with fall and right chest pain, initial encounter EXAM: RIGHT RIBS AND CHEST - 3+ VIEW COMPARISON:  None. FINDINGS: Cardiac shadow is within normal limits. Lungs are well aerated bilaterally. Postsurgical changes in the cervical spine are seen. No rib abnormality noted. IMPRESSION: No acute abnormality seen. Electronically Signed   By: Inez Catalina M.D.   On: 12/01/2021 22:22   DG Thoracic Spine 2 View  Result Date: 12/01/2021 CLINICAL DATA:  Fall. EXAM: THORACIC SPINE 2 VIEWS COMPARISON:  None. FINDINGS: There is no evidence of thoracic spine fracture. Alignment is normal. No other significant bone abnormalities are identified. C4-C5 anterior fusion plate is present. IMPRESSION: Negative. Electronically Signed   By: Ronney Asters M.D.   On: 12/01/2021 22:21    Impression: 60 y.o. 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  She was discharged on erythromycin as she was intolerant to Reglan, Xifaxan 3 times daily, senna twice daily, Carafate 3 times daily.  She returned to the emergency room yesterday following a syncopal episode and ongoing nausea/vomiting, found to have hypotension, hypokalemia, hypomagnesemia, and anemia.   Nausea/vomiting/anemia/melena: Possibly secondary to gastroparesis.  No gastric emptying study on file.  Unable to complete in setting of chronic opioid use.  EGD May 2022 with single gastric polyp, otherwise no significant abnormalities s/p empiric esophageal dilation, small bowel biopsies benign.  Notably, patient reports her symptoms were very mild at that point and have  been progressive since then.  She is unable to eat any solid foods as she will vomit undigested food back up.  Seems to be okay with liquids.  GERD is been well controlled on omeprazole twice daily.  Denies NSAIDs. She does tell me she has had intermittent melena for the last month and I do note decline in her hemoglobin from 13.4, 2 weeks ago to 11.8 today.    Suspect her primary issue is likely gastroparesis; however, in light of melena and worsening nausea/vomiting with inability to tolerate any solid foods, feel it would be reasonable to repeat an upper endoscopy this admission to evaluate for PUD and/or pyloric stenosis.  Discussed case with Dr. Jenetta Downer who is agreeable.   Otherwise, regarding question of gastroparesis, she was intolerant to Reglan due to tremors.  Could consider resuming erythromycin while inpatient if EGD is unrevealing.  We have tried to obtain Motegrity, but this was denied though indication was for gastroparesis.  Could consider resubmitting with indication of IBS-C/CIC.  Chronic  constipation:  Possibly secondary to diffuse GI dysmotility and questioned GI pseudoobstruction previously. Can't rule out underlying stricture from adhesions in the setting of multiple GI surgeries. Chronically taking stool softener with laxative which works fairly well, but worsening constipation recently with small bowel movements and having to double up on her medications. Last BM was today. No worsening of her chronic abdominal pain and reports she doesn't feel like she did previously with SBO/partial bowel obstruction. Will add MiraLAX daily. Can consider resubmitting for Motegrity with constipation as the indication.   Hypokalemia/hypomagnesemia: Correction per hospitalist.  Hypotension:  Likely due to poor p.o. intake.  She has been resuscitated with fluids.  Plan: Anticipate EGD with propofol with Dr. Jenetta Downer tomorrow as long as electrolytes are corrected. Continue for liquid diet today.  N.p.o. at midnight.  Reassess in the morning. Hold Xarelto.  Last dose morning of 11/5. Monitor H&H and for overt GI bleeding. Check FOBT. PPI twice daily. Continue Senokot 2 tablets twice daily. Add MiraLAX 17 g daily. Correction of electrolyte abnormalities per hospitalist. Consider resubmitting for [place] with indication of constipation.    LOS: 0 days    12/02/2021, 2:57 PM   Aliene Altes, [facility]

## 2021-12-02 NOTE — Consult Note (Signed)
@LOGO @   Referring Provider: [facility]  Primary Care Physician:  Fayrene Helper, MD Primary Gastroenterologist:  Dr. Jenetta Downer  Date of Admission: 12/01/21 Date of Consultation: 12/02/21  Reason for Consultation:  GI dysmotility, nausea, vomiting  HPI:  [name] is a 60 y.o. year old female with multiple medical issues, complicated surgical hx of partial colectomy due to redundant colon w/ primary anastomosis, perforated bowel due to gynelogic procedure complicated by enterocutaneous fistula that required multiple surgeries and multiple SBOs, and recent admission on 10/29 for partial SBO and treated with bowel rest, admitted again in November due to ongoing nausea, vomiting, diarrhea, and abdominal pain, thought to have GI pseudoobstruction, possibly gastroparesis, and small bowel dysmotility, but stricture remains in the differential in light of multiple surgeries.  She was discharged on erythromycin as she was intolerant to Reglan, Xifaxan 3 times daily, senna twice daily, Carafate 3 times daily.  She returned to the emergency room yesterday following a syncopal episode and ongoing nausea/vomiting.  ED course: Hypotensive with BP as low as 83/58, otherwise vital signs within normal limits. Labs remarkable for hemoglobin 11.8 (L), potassium 2.7 (L), magnesium 1.3 (L), calcium 8.0 (L), glucose 125 (H).  Thoracic spine x-ray negative.  Unilateral right chest x-ray negative. She received IV pain medications, IV fluids, magnesium, potassium in the ED.   Consult:  Not sure why she is passing out. She has passed out twice, first in October and again yesterday. Not sure why. States it was sudden and didn't know it was happening. Prior, she has had some symptoms of feeling like she was going to pas out and would lay down on the floor. Her hands draw up some when this occurs.   Has been having intermittent nausea and vomiting. Some days she will be fine. Other days she will  have vomiting of foods she ate 1-2 days prior. When eating soup or yogurt, she does much better, but anything solid, she will vomit back up.  Feels like she is starving, but eats and gets full super quickly. GERD is well controlled on omeprazole BID. Mild dysphagia to pills, dry foods, breads, rice.  Had some improvement with last dilation, but feels this is returning. Reports at the time of her last EGD, this is when all of her symptoms were just starting.    Reports her diarrhea stopped after she left the hospital.  Chronically, she struggles with constipation and takes 2 stool softeners with a laxative in the morning and night.  When bowels move, they are typically mushy to watery.  Since her hospitalization, bowels have been fluctuating from BM daily, then maybe a small BM, or may skip a day.  If her bowels do not move well, she will take 2 additional laxatives which usually results in a good bowel movement and allows her to feel better.  Good BM yesterday in the ED yesterday. Had a small BM this today. Doesn't feel she has a blockage. No worsening abdominal pain. Her pain is at baseline. Not like before.   Some times black stools. Started back in November. Has noticed this 3 more times since hospital discharge. Single episode of bright red blood in her stool in November, but reports she does have hemorrhoids.  No NSAIDs. Last dose of Xarelto was Monday morning.   Last EGD 05/02/2021: No endoscopic esophageal abnormality to explain dysphagia s/p dilation and biopsied, single gastric polyp resected, normal examined duodenum biopsied. Pathology revealed benign small bowel biopsy, hyperplastic gastric polyp, benign  esophageal biopsies.  Last colonoscopy 05/02/2021: Fair prep, normal ileum, patent side-to-side ileocolonic anastomosis, torsion and anastomosis, normal mucosa biopsied, nonbleeding internal hemorrhoids.  Pathology with benign colonic mucosa, mild melanosis coli.   Past Medical History:   Diagnosis Date   Abdominal hernia 08/14/2019   Abdominal pain 06/06/2019   Acute bronchitis 05/01/2013   Acute cystitis 06/09/2010   Qualifier: Diagnosis of  By: Cori Razor LPN, Brandi     Acute renal insufficiency 08/28/2011   Allergy    Phreesia 03/16/2021   Anemia    Phreesia 10/29/2020   Anxiety    Anxiety state 03/16/2008   Qualifier: Diagnosis of  By: Dierdre Harness     Arthritis    Phreesia 10/29/2020   Back pain with radiation 07/17/2014   Bilateral hand pain 07/27/2016   Blood transfusion without reported diagnosis    Phreesia 10/29/2020   Breast cancer ([place])    L breast- 2006   Cancer ([place])    Phreesia 10/29/2020   Carpal tunnel syndrome    Chronic constipation    Chronic pain syndrome    followed by [facility]---  back   Clotting disorder ([place])    Phreesia 10/29/2020   Depression    Dermatitis 12/15/2011   Difficult intubation    Educated about COVID-19 virus infection 05/14/2019   Fall at home 01/26/2016   Family history of adverse reaction to anesthesia    MOTHER--- PONV   Fatigue 09/17/2012   Fibromyalgia    FIBROMYALGIA 03/16/2008   Qualifier: Diagnosis of  By: Dierdre Harness     GERD (gastroesophageal reflux disease)    Headache disorder 01/16/2013   Headache syndrome 01/26/2016   Hip pain, right 07/08/2019   History of MRSA infection    lip abscess   History of ovarian cyst 06/2011   s/p  BSO   History of pulmonary embolus (PE) 1997   post EP with ablation pulmonary veouns for SVT/ Atrial Fib.   History of supraventricular tachycardia    s/p  ablation 1996  and 1997  by dr Caryl Comes   History of TIA (transient ischemic attack) 1997   post op EP ablation PE   Hyperlipidemia    Hyperlipidemia LDL goal <100 03/16/2008   Qualifier: Diagnosis of  By: Dierdre Harness     Hypothyroidism    followed by pcp   Incisional hernia    Insomnia 12/15/2011   Intermittent palpitations 08/22/2017   Interstitial cystitis    09-13-2018   per pt last flare-up  May 2019 (followed  by pcp)   INTERSTITIAL CYSTITIS 02/17/2011   Qualifier: Diagnosis of  By: Moshe Cipro MD, Margaret     Iron deficiency anemia    09-13-2018  PER PT STABLE   Irregular heart rate 07/25/2013   Lipoma of back    upper   Low back pain radiating to right leg 07/04/2019   Low ferritin level 06/14/2014   MDD (major depressive disorder), single episode, in full remission ([place]) 10/27/2017   Medically noncompliant 02/28/2015   Multiple missed appointments, both follow-up appointments and lab appointments.    Metabolic syndrome X 0/25/8527   Qualifier: Diagnosis of  By: Moshe Cipro MD, Margaret  hBA1c is 5.8 in 02/2013    Migraines    Morbid obesity ([place]) 03/27/2013   Muscle spasm 01/03/2020   Nausea alone 07/17/2014   NECK PAIN, CHRONIC 03/16/2008   Qualifier: Diagnosis of  By: Dierdre Harness     Normal coronary arteries    a. by CT 12/2018.  Obesity 03/16/2008   Qualifier: Diagnosis of  By: Dierdre Harness     Oral ulceration 08/23/2014   Presented at 06/14/2014 visit    Other malaise and fatigue 03/16/2008   Centricity Description: FATIGUE, CHRONIC Qualifier: Diagnosis of  By: Dierdre Harness   Centricity Description: FATIGUE Qualifier: Diagnosis of  By: Claybon Jabs PA, Dawn     OVARIAN CYST 12/26/2009   Qualifier: Diagnosis of  By: Moshe Cipro MD, Margaret     Paget disease of breast, left [facility])    Paget's disease of breast, left ([place]) 03/16/2008   Qualifier: Diagnosis of  By: Dierdre Harness  Left diagnosed in 2006 F/h breast cancer x 15 family members   Partial small bowel obstruction ([place]) 07/04/2012   PONV (postoperative nausea and vomiting)    SEVERE   Postsurgical menopause 11/13/2011   Presence of IVC filter 06/06/2019   PSVT (paroxysmal supraventricular tachycardia) ([place])    [place]   Recurrent oral herpes simplex infection 10/13/2017   ROM (right otitis media) 01/23/2013   S/P insertion of IVC (inferior vena caval) filter 05/08/2005   [place] (non-retrievable)  /  dx 2019  a leg of filter  is protruding thru the vena cava in to right L2 vertebral body (09-13-2018  per pt having surgery to remove filter in [place])   S/P radiofrequency ablation operation for arrhythmia 1996   1996  and 1997,   SVT and Atrial Fib   Sinusitis, chronic 01/26/2016   SMALL BOWEL OBSTRUCTION, HX OF 08/07/2008   Annotation: obstruction w/ adhesions led to partial colectomy Qualifier: Diagnosis of  By: Craige Cotta     Stroke [facility])    Newborn 10/29/2020, TIAs in the past   Swelling of hand 09/17/2012   Syncope    Tachycardia 02/03/2010   Qualifier: Diagnosis of  By: Via LPN, Lynn     Thyroid disease    Phreesia 10/29/2020   Ulcer 12/15/2011   Urinary frequency 07/18/2014   Vaginitis and vulvovaginitis 05/10/2013   Vitamin D deficiency 05/05/2015   Wears glasses     Past Surgical History:  Procedure Laterality Date   ABDOMINAL HYSTERECTOMY  1987   ANTERIOR CERVICAL DECOMP/DISCECTOMY FUSION  03-07-2002   dr elsner  @[facility]    C 4 -- 5   APPENDECTOMY  1980   AUGMENTATION MAMMAPLASTY Right 2006   BILATERAL SALPINGOOPHORECTOMY  07/24/2011   via Explor. Lap. w/ intraoperative perf. bowel repair   BIOPSY  05/02/2021   Procedure: BIOPSY;  Surgeon: Montez Morita, Quillian Quince, MD;  [facility];  Service: Gastroenterology;;  small bowel, mid esophagus, distal esophagus, random colon biopsies   BREAST BIOPSY Right 2019   benign   BREAST ENHANCEMENT SURGERY Bilateral 1993   BREAST IMPLANT REMOVAL Bilateral    BREAST SURGERY N/A    Phreesia 10/29/2020   CARDIAC CATHETERIZATION  07-06-2003   dr Darnell Level brodie   normal coronaries and LVF   CARDIAC ELECTROPHYSIOLOGY STUDY AND ABLATION  1996  and 1997   CARDIOVASCULAR STRESS TEST  11-18-2015   dr Caryl Comes   normal nuclear study w/ no ischemia/  normal LV function and wall motion , ef 84%   CARPAL TUNNEL RELEASE Right ?   CESAREAN SECTION  1986   CESAREAN SECTION N/A    Phreesia 10/29/2020   CHOLECYSTECTOMY N/A    Phreesia 10/29/2020   COLON SURGERY      COLONOSCOPY WITH PROPOFOL N/A 05/02/2021   Procedure: COLONOSCOPY WITH PROPOFOL;  Surgeon: Harvel Quale, MD;  [facility]  [facility];  Service: Gastroenterology;  Laterality: N/A;  12:30 PM   CYSTO/  HYDRODISTENTION/  INSTILATION THERAPY  MULTIPLE   ENTEROCUTANEOUS FISTULA CLOSURE  multiple   last one 2015 with small bowel resection   ESOPHAGOGASTRODUODENOSCOPY (EGD) WITH PROPOFOL N/A 05/02/2021   Procedure: ESOPHAGOGASTRODUODENOSCOPY (EGD) WITH PROPOFOL;  Surgeon: Harvel Quale, MD;  [facility];  Service: Gastroenterology;  Laterality: N/A;   EXPLORATORY LAPAROTOMY INCISIONAL VENTRAL HERNIA REPAIR / RESECTION SMALL BOWEL  11-09-2014   @[facility]    FRACTURE SURGERY N/A    Phreesia 10/29/2020   JOINT REPLACEMENT N/A    Phreesia 10/29/2020   LIPOMA EXCISION Right 09/16/2018   Procedure: EXCISION LIPOMA UPPER BACK;  Surgeon: Clovis Riley, MD;  Location: [place];  Service: General;  Laterality: Right;   MASTECTOMY Left 2006   w/ reconstruction on left (paget's disease)  and right breast augmentation   POLYPECTOMY  05/02/2021   Procedure: POLYPECTOMY;  Surgeon: Harvel Quale, MD;  [facility];  Service: Gastroenterology;;  gastric   SAVORY DILATION  05/02/2021   Procedure: Azzie Almas DILATION;  Surgeon: Harvel Quale, MD;  [facility];  Service: Gastroenterology;;   SMALL INTESTINE SURGERY N/A    Phreesia 10/29/2020   SPINE SURGERY N/A    Phreesia 10/29/2020   TOTAL COLECTOMY  08-04-2002    @[facility]    AND CHOLECYSTECTOMY  (colonic inertia)   TRANSTHORACIC ECHOCARDIOGRAM  02/04/2016   ef 60-65%,  grade 2 diastolic dysfunction/  mild MR   TUBAL LIGATION N/A    Phreesia 03/16/2021   VENA CAVA FILTER PLACEMENT  05/08/2005   @[facility]    [place] (non-retrievable)   WIDE EXCISION PERIRECTAL ABSCESSES  09-22-2005   @ [facility]    Prior to Admission medications   Medication Sig Start Date End Date Taking?  Authorizing Provider  ALPRAZolam [name]) 1 MG tablet Take 1 tablet (1 mg total) by mouth 2 (two) times daily. 08/26/21  Yes Fayrene Helper, MD  aspirin 81 MG chewable tablet Chew 81 mg by mouth daily.   Yes [provider]  butalbital-acetaminophen-caffeine (FIORICET) 50-325-40 MG tablet Take 1 tablet by mouth as directed. 06/19/19  Yes [provider]  conjugated estrogens (PREMARIN) vaginal cream Place fingertip amount intravaginally 2 to 3 times per week Patient taking differently: Place 1 mg vaginally at bedtime. Place fingertip amount intravaginally 2 to 3 times per week 07/12/20  Yes Fayrene Helper, MD  cyclobenzaprine (FLEXERIL) 10 MG tablet TAKE 1 TABLET BY MOUTH TWICE DAILY AS NEEDED FOR MUSCLE SPASMS 02/21/20  Yes Fayrene Helper, MD  cycloSPORINE (RESTASIS) 0.05 % ophthalmic emulsion Place 1 drop into both eyes 2 (two) times daily as needed (dry eyes).   Yes [provider]  DULoxetine (CYMBALTA) 60 MG capsule Take 60 mg by mouth daily. 04/10/21  Yes [provider]  Erythromycin 250 MG TBEC Take 250 mg by mouth 3 (three) times daily before meals. 11/18/21  Yes Kathie Dike, MD  ezetimibe (ZETIA) 10 MG tablet Take 1 tablet (10 mg total) by mouth daily. 09/28/21  Yes Fayrene Helper, MD  fluticasone (FLONASE) 50 MCG/ACT nasal spray Place 2 sprays into both nostrils daily. Patient taking differently: Place 2 sprays into both nostrils daily as needed for allergies. 10/31/20  Yes Fayrene Helper, MD  HYDROmorphone (DILAUDID) 4 MG tablet Take 4 mg by mouth in the morning, at noon, and at bedtime.   Yes [provider]  levothyroxine (SYNTHROID) 125  MCG tablet TAKE 1 TABLET(125 MCG) BY MOUTH DAILY BEFORE BREAKFAST 08/26/21  Yes Fayrene Helper, MD  magnesium oxide (MAG-OX) 400 (240 Mg) MG tablet Take 1 tablet (400 mg total) by mouth 2 (two) times daily. Please make overdue appt with Dr. Caryl Comes before anymore refills. Thank you 1st  attempt Patient taking differently: Take 400 mg by mouth daily. Please make overdue appt with Dr. Caryl Comes before anymore refills. Thank you 1st attempt 09/03/21  Yes Deboraha Sprang, MD  meclizine (ANTIVERT) 25 MG tablet Take 1 tablet (25 mg total) by mouth 3 (three) times daily as needed for dizziness. 10/31/20  Yes Fayrene Helper, MD  omeprazole (PRILOSEC) 40 MG capsule Take 1 capsule (40 mg total) by mouth in the morning and at bedtime. 11/18/21  Yes Kathie Dike, MD  pravastatin (PRAVACHOL) 40 MG tablet TAKE 1 TABLET(40 MG) BY MOUTH DAILY Patient taking differently: Take 40 mg by mouth daily. TAKE 1 TABLET(40 MG) BY MOUTH DAILY 01/15/21  Yes Fayrene Helper, MD  senna-docusate (SENOKOT-S) 8.6-50 MG per tablet Take 2 tablets by mouth 2 (two) times daily. constipation   Yes [provider]  sucralfate (CARAFATE) 1 g tablet Take 1 tablet (1 g total) by mouth 4 (four) times daily -  with meals and at bedtime. 11/18/21  Yes Memon, Jolaine Artist, MD  XARELTO 20 MG TABS tablet TAKE 1 TABLET(20 MG) BY MOUTH DAILY Patient taking differently: Take 20 mg by mouth daily with supper. 10/03/21  Yes Lindell Spar, MD  cyanocobalamin (,VITAMIN B-12,) 1000 MCG/ML injection Inject 1 mL (1,000 mcg total) into the muscle every 30 (thirty) days. 09/18/21   Harriett Rush, PA-C  metoprolol tartrate (LOPRESSOR) 25 MG tablet Take 1 tablet (25 mg total) by mouth 2 (two) times daily. Patient not taking: Reported on 12/02/2021 01/15/21   Fayrene Helper, MD  Multiple Vitamins-Minerals (ICAPS AREDS 2) CAPS Take 1 capsule by mouth at bedtime. Patient not taking: Reported on 12/02/2021    [provider]  predniSONE (DELTASONE) 10 MG tablet Take 40mg  po daily for 2 days then 30mg  daily for 2 days then 20mg  daily for 2 days then 10mg  daily for 2 days then stop Patient not taking: Reported on 12/02/2021 11/18/21   Kathie Dike, MD  promethazine (PHENERGAN) 25 MG tablet Take by mouth. Patient not  taking: Reported on 12/02/2021 07/29/18   [provider]  Prucalopride Succinate (MOTEGRITY) 1 MG TABS Take 1 mg by mouth daily at 2 PM. Patient not taking: Reported on 12/02/2021 11/27/21   Harvel Quale, MD  rifaximin (XIFAXAN) 550 MG TABS tablet Take 1 tablet (550 mg total) by mouth 3 (three) times daily. Patient not taking: Reported on 12/02/2021 11/18/21   Kathie Dike, MD  Specialty Vitamins Products (VITAMINS FOR HAIR PO) Take 1 capsule by mouth daily. Patient not taking: Reported on 12/02/2021    [provider]  vitamin C (ASCORBIC ACID) 500 MG tablet Take 500 mg by mouth at bedtime. Patient not taking: Reported on 12/02/2021    [provider]    Current Facility-Administered Medications  Medication Dose Route Frequency Provider Last Rate Last Admin   0.9 %  sodium chloride infusion  250 mL Intravenous PRN Emokpae, Courage, MD       acetaminophen (TYLENOL) tablet 650 mg  650 mg Oral Q6H PRN Emokpae, Courage, MD       Or   acetaminophen (TYLENOL) suppository 650 mg  650 mg Rectal Q6H PRN Roxan Hockey, MD  ALPRAZolam (XANAX) tablet 0.5 mg  0.5 mg Oral TID PRN Roxan Hockey, MD       [START ON 12/03/2021] aspirin EC tablet 81 mg  81 mg Oral Q breakfast Emokpae, Courage, MD       bisacodyl (DULCOLAX) suppository 10 mg  10 mg Rectal Daily PRN Emokpae, Courage, MD       cyclobenzaprine (FLEXERIL) tablet 10 mg  10 mg Oral BID PRN Emokpae, Courage, MD       cycloSPORINE (RESTASIS) 0.05 % ophthalmic emulsion 1 drop  1 drop Both Eyes BID PRN Emokpae, Courage, MD       DULoxetine (CYMBALTA) DR capsule 60 mg  60 mg Oral Daily Emokpae, Courage, MD   60 mg at 12/02/21 1328   erythromycin (E-MYCIN) tablet 250 mg  250 mg Oral TID AC Emokpae, Courage, MD       ezetimibe (ZETIA) tablet 10 mg  10 mg Oral Daily Emokpae, Courage, MD       HYDROmorphone (DILAUDID) tablet 2 mg  2 mg Oral Q4H PRN Denton Brick, Courage, MD   2 mg at 12/02/21 1329   [START ON  12/03/2021] levothyroxine (SYNTHROID) tablet 125 mcg  125 mcg Oral Q0600 Emokpae, Courage, MD       magnesium oxide (MAG-OX) tablet 400 mg  400 mg Oral BID Emokpae, Courage, MD       meclizine (ANTIVERT) tablet 25 mg  25 mg Oral TID PRN Emokpae, Courage, MD       multivitamin-lutein (OCUVITE-LUTEIN) capsule 1 capsule  1 capsule Oral QHS Emokpae, Courage, MD       ondansetron (ZOFRAN) tablet 4 mg  4 mg Oral Q6H PRN Emokpae, Courage, MD       Or   ondansetron (ZOFRAN) injection 4 mg  4 mg Intravenous Q6H PRN Emokpae, Courage, MD       pantoprazole (PROTONIX) EC tablet 40 mg  40 mg Oral Daily Emokpae, Courage, MD   40 mg at 12/02/21 1330   polyethylene glycol (MIRALAX / GLYCOLAX) packet 17 g  17 g Oral Daily PRN Emokpae, Courage, MD       potassium chloride SA (KLOR-CON M) CR tablet 40 mEq  40 mEq Oral Once Emokpae, Courage, MD       pravastatin (PRAVACHOL) tablet 40 mg  40 mg Oral QPM Emokpae, Courage, MD       rifaximin (XIFAXAN) tablet 550 mg  550 mg Oral TID Denton Brick, Courage, MD       rivaroxaban (XARELTO) tablet 20 mg  20 mg Oral Q supper Emokpae, Courage, MD       senna-docusate (Senokot-S) tablet 2 tablet  2 tablet Oral BID Emokpae, Courage, MD       sodium chloride flush (NS) 0.9 % injection 3 mL  3 mL Intravenous Q12H Emokpae, Courage, MD   3 mL at 12/02/21 1338   sodium chloride flush (NS) 0.9 % injection 3 mL  3 mL Intravenous Q12H Emokpae, Courage, MD       sodium chloride flush (NS) 0.9 % injection 3 mL  3 mL Intravenous PRN Emokpae, Courage, MD       sucralfate (CARAFATE) tablet 1 g  1 g Oral TID WC & HS Emokpae, Courage, MD       traZODone (DESYREL) tablet 50 mg  50 mg Oral QHS PRN Roxan Hockey, MD        Allergies as of 12/01/2021 - Review Complete 12/01/2021  Allergen Reaction Noted   Nitrofurantoin Hives 03/16/2008   Crestor [rosuvastatin calcium]  Other (See Comments) 08/08/2019   Bisacodyl Other (See Comments) 07/03/2013   Clarithromycin Hives and Other (See Comments)  09/22/2010   Clarithromycin Other (See Comments) 08/29/2011   Clindamycin hcl Hives 08/29/2011   Codeine Itching 03/16/2008   Iron sucrose Other (See Comments) 03/10/2021   Monosodium glutamate Other (See Comments) 06/26/2014   Scopolamine hbr Other (See Comments) 08/29/2011    Family History  Problem Relation Age of Onset   Diabetes Mother    Heart disease Mother    Hypertension Mother    Heart disease Father    Hyperlipidemia Father    Hypertension Father    Alcohol abuse Father    Colon cancer Maternal Aunt    Breast cancer Maternal Aunt    Cancer Maternal Uncle        mets   Bone cancer Maternal Grandfather        mets   Ovarian cancer Cousin 19   Breast cancer Cousin    Prostate cancer Maternal Uncle    Breast cancer Maternal Aunt    Brain cancer Maternal Aunt    Breast cancer Maternal Aunt     Social History   Socioeconomic History   Marital status: Married    Spouse name: Not on file   Number of children: Not on file   Years of education: Not on file   Highest education level: Not on file  Occupational History   Not on file  Tobacco Use   Smoking status: Never   Smokeless tobacco: Never  Vaping Use   Vaping Use: Never used  Substance and Sexual Activity   Alcohol use: No    Comment: twice per year   Drug use: No   Sexual activity: Yes    Birth control/protection: Surgical  Other Topics Concern   Not on file  Social History Narrative   Not on file   Social Determinants of Health   Financial Resource Strain: Medium Risk   Difficulty of Paying Living Expenses: Somewhat hard  Food Insecurity: No Food Insecurity   Worried About Charity fundraiser in the Last Year: Never true   [place] in the Last Year: Never true  Transportation Needs: No Transportation Needs   Lack of Transportation (Medical): No   Lack of Transportation (Non-Medical): No  Physical Activity: Insufficiently Active   Days of Exercise per Week: 7 days   Minutes of  Exercise per Session: 20 min  Stress: No Stress Concern Present   Feeling of Stress : Not at all  Social Connections: Moderately Isolated   Frequency of Communication with Friends and Family: More than three times a week   Frequency of Social Gatherings with Friends and Family: More than three times a week   Attends Religious Services: Never   Marine scientist or Organizations: No   Attends Music therapist: Never   Marital Status: Married  Human resources officer Violence: Not At Risk   Fear of Current or Ex-Partner: No   Emotionally Abused: No   Physically Abused: No   Sexually Abused: No    Review of Systems: Gen: Denies fever, chills, cold or flulike symptoms. CV: Denies chest pain, heart palpitations. Resp: Denies shortness of breath or cough. GI: See HPI GU : Denies urinary burning, urinary frequency, urinary incontinence.  Derm: Denies rash Psych: Denies depression, anxiety Heme: See HPI  Physical Exam: Vital signs in last 24 hours: Temp:  [98.4 F (36.9 C)-98.7 F (37.1 C)] 98.7 F (37.1 C) (12/06  1433) Pulse Rate:  [70-91] 78 (12/06 1433) Resp:  [12-20] 20 (12/06 1433) BP: (83-128)/(51-82) 128/52 (12/06 1433) SpO2:  [95 %-100 %] 100 % (12/06 1433) Weight:  [83.5 kg] 83.5 kg (12/05 1619) Last BM Date: 12/01/21 General:   Alert,  Well-developed, well-nourished, pleasant and cooperative in NAD Head:  Normocephalic and atraumatic. Eyes:  Sclera clear, no icterus.   Conjunctiva pink. Ears:  Normal auditory acuity. Lungs:  Clear throughout to auscultation.   No wheezes, crackles, or rhonchi. No acute distress. Heart:  Regular rate and rhythm; no murmurs, clicks, rubs,  or gallops. Abdomen:  Bowel sounds present, soft, non-distended. Moderate generalized TTP (about the same as yesterday per patient), ventral midline hernia present with tenderness but soft and reducible (chronic), no rebound or guarding.  No hepatosplenomegaly. Rectal:  Deferred until time  of colonoscopy.   Msk:  Symmetrical without gross deformities. Normal posture. Extremities:  Without edema. Neurologic:  Alert and  oriented x4;  grossly normal neurologically. Skin:  Intact without significant lesions or rashes. Psych: Normal mood and affect.  Intake/Output from previous day: No intake/output data recorded. Intake/Output this shift: Total I/O In: 1549.3 [IV Piggyback:1549.3] Out: -   Lab Results: Recent Labs    12/01/21 1650  WBC 8.8  HGB 11.8*  HCT 36.0  PLT 212   BMET Recent Labs    12/01/21 1650  NA 136  K 2.7*  CL 99  CO2 31  GLUCOSE 125*  BUN 17  CREATININE 0.91  CALCIUM 8.0*    Studies/Results: DG Ribs Unilateral W/Chest Right  Result Date: 12/01/2021 CLINICAL DATA:  Recent syncopal episode with fall and right chest pain, initial encounter EXAM: RIGHT RIBS AND CHEST - 3+ VIEW COMPARISON:  None. FINDINGS: Cardiac shadow is within normal limits. Lungs are well aerated bilaterally. Postsurgical changes in the cervical spine are seen. No rib abnormality noted. IMPRESSION: No acute abnormality seen. Electronically Signed   By: Inez Catalina M.D.   On: 12/01/2021 22:22   DG Thoracic Spine 2 View  Result Date: 12/01/2021 CLINICAL DATA:  Fall. EXAM: THORACIC SPINE 2 VIEWS COMPARISON:  None. FINDINGS: There is no evidence of thoracic spine fracture. Alignment is normal. No other significant bone abnormalities are identified. C4-C5 anterior fusion plate is present. IMPRESSION: Negative. Electronically Signed   By: Ronney Asters M.D.   On: 12/01/2021 22:21    Impression: 60 y.o. 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  She was discharged on erythromycin as she was intolerant to Reglan, Xifaxan 3 times daily, senna twice daily, Carafate 3 times daily.  She returned to the emergency room yesterday following a syncopal episode and ongoing nausea/vomiting, found to have hypotension, hypokalemia, hypomagnesemia, and anemia.   Nausea/vomiting/anemia/melena: Possibly secondary to gastroparesis.  No gastric emptying study on file.  Unable to complete in setting of chronic opioid use.  EGD May 2022 with single gastric polyp, otherwise no significant abnormalities s/p empiric esophageal dilation, small bowel biopsies benign.  Notably, patient reports her symptoms were very mild at that point and have  been progressive since then.  She is unable to eat any solid foods as she will vomit undigested food back up.  Seems to be okay with liquids.  GERD is been well controlled on omeprazole twice daily.  Denies NSAIDs. She does tell me she has had intermittent melena for the last month and I do note decline in her hemoglobin from 13.4, 2 weeks ago to 11.8 today.    Suspect her primary issue is likely gastroparesis; however, in light of melena and worsening nausea/vomiting with inability to tolerate any solid foods, feel it would be reasonable to repeat an upper endoscopy this admission to evaluate for PUD and/or pyloric stenosis.  Discussed case with Dr. Jenetta Downer who is agreeable.   Otherwise, regarding question of gastroparesis, she was intolerant to Reglan due to tremors.  Could consider resuming erythromycin while inpatient if EGD is unrevealing.  We have tried to obtain Mote[employer], but this was denied though indication was for gastroparesis.  Could consider resubmitting with indication of IBS-C/CIC.  Chronic  constipation:  Possibly secondary to diffuse GI dysmotility and questioned GI pseudoobstruction previously. Can't rule out underlying stricture from adhesions in the setting of multiple GI surgeries. Chronically taking stool softener with laxative which works fairly well, but worsening constipation recently with small bowel movements and having to double up on her medications. Last BM was today. No worsening of her chronic abdominal pain and reports she doesn't feel like she did previously with SBO/partial bowel obstruction. Will add MiraLAX daily. Can consider resubmitting for M[employer] with constipation as the indication.   Hypokalemia/hypomagnesemia: Correction per hospitalist.  Hypotension:  Likely due to poor p.o. intake.  She has been resuscitated with fluids.  Plan: Anticipate EGD with propofol with Dr. Jenetta Downer tomorrow as long as electrolytes are corrected. Continue for liquid diet today.  N.p.o. at midnight.  Reassess in the morning. Hold Xarelto.  Last dose morning of 11/5. Monitor H&H and for overt GI bleeding. Check FOBT. PPI twice daily. Continue Senokot 2 tablets twice daily. Add MiraLAX 17 g daily. Correction of electrolyte abnormalities per hospitalist. Consider resubmitting for [place] with indication of constipation.    LOS: 0 days    12/02/2021, 2:57 PM   Aliene Altes, [facility]

## 2021-12-02 NOTE — H&P (Addendum)
Patient Demographics:    [name], is a 60 y.o. female  MRN: [id number]   DOB - September 25, 1961  Admit Date - 12/01/2021  Outpatient Primary MD for the patient is Fayrene Helper, MD   Assessment & Plan:    Principal Problem:   Hypokalemia Active Problems:   Syncope   Hypomagnesemia   Anemia   Iron deficiency anemia   Nausea and vomiting   Abdominal pain   Essential hypertension   Dark stools   Intractable nausea/vomiting/abdominal pain/Diarrhea --No fevers or leukocytosis -Stool C. difficile -GI pathogen panel - 1 L bolus given, continue n/s 100cc/hr x1 day -As needed 0.5 mg Dilaudid -Bowel rest with clear liquid diet -Patient with longstanding history of GI motility problems and gastroparesis -- GI consult appreciated -Plans for possible EGD on 12/03/2021 -Insurance will not pay for [employer] -Previously treated with erythromycin   Syncope----recurrent episodes of syncope usually in the setting of dehydration and electrolyte abnormality due to GI losses  -EKG sinus rhythm without acute changes -Echocardiogram from 11/12/21--EF 60-65%, no WMA, trivial MR -Thoracic spine and chest as well as rib x-rays without acute fractures -Continue IV fluids and replace electrolytes   Hypokalemia/hypomagnesemia-----due to ongoing GI losses, replace and recheck -EKG with sinus rhythm  Hypertension-blood pressure soft. -Hold metoprolol   DVT/PE -History of IVC filter placement with complications -Resume Xarelto   ADHD, anxiety -Continue Xanax, Cymbalta   Fibromyalgia, chronic pain-goes to pain clinic. -Hold home Dilaudid   DM - Hold ozempic -Use Novolog/Humalog Sliding scale insulin with Accu-Cheks/Fingersticks as ordered    Chronic pain syndrome -- Patient gets opiates from pain clinic  -she  is followed in Dr. Freddie Apley office    Hypothyroidism -Continue Synthroid   Dehydration/hypotension--- IV fluids as ordered liquid diet as ordered by GI service  Disposition/Need for in-Hospital Stay- patient unable to be discharged at this time due to --vomiting diarrhea and dehydration with electrolyte abnormalities requiring IV fluids and electrolyte replacement -Possible discharge in 1 to 2 days if your symptoms abate and hemodynamically stable  Status is: Inpatient  Remains inpatient appropriate because: Disposition above  Dispo: The patient is from: Home              Anticipated d/c is to: Home              Anticipated d/c date is: 2 days              Patient currently is not medically stable to d/c. Barriers: Not Clinically Stable-    With History of - Reviewed by me  Past Medical History:  Diagnosis Date   Abdominal hernia 08/14/2019   Abdominal pain 06/06/2019   Acute bronchitis 05/01/2013   Acute cystitis 06/09/2010   Qualifier: Diagnosis of  By: Cori Razor LPN, Brandi     Acute renal insufficiency 08/28/2011   Allergy    Phreesia 03/16/2021   Anemia    Phreesia 10/29/2020   Anxiety  Anxiety state 03/16/2008   Qualifier: Diagnosis of  By: Dierdre Harness     Arthritis    Phreesia 10/29/2020   Back pain with radiation 07/17/2014   Bilateral hand pain 07/27/2016   Blood transfusion without reported diagnosis    Phreesia 10/29/2020   Breast cancer ([place])    L breast- 2006   Cancer ([place])    Phreesia 10/29/2020   Carpal tunnel syndrome    Chronic constipation    Chronic pain syndrome    followed by [facility]---  back   Clotting disorder ([place])    Phreesia 10/29/2020   Depression    Dermatitis 12/15/2011   Difficult intubation    Educated about COVID-19 virus infection 05/14/2019   Fall at home 01/26/2016   Family history of adverse reaction to anesthesia    MOTHER--- PONV   Fatigue 09/17/2012   Fibromyalgia    FIBROMYALGIA 03/16/2008   Qualifier: Diagnosis of   By: Dierdre Harness     GERD (gastroesophageal reflux disease)    Headache disorder 01/16/2013   Headache syndrome 01/26/2016   Hip pain, right 07/08/2019   History of MRSA infection    lip abscess   History of ovarian cyst 06/2011   s/p  BSO   History of pulmonary embolus (PE) 1997   post EP with ablation pulmonary veouns for SVT/ Atrial Fib.   History of supraventricular tachycardia    s/p  ablation 1996  and 1997  by dr Caryl Comes   History of TIA (transient ischemic attack) 1997   post op EP ablation PE   Hyperlipidemia    Hyperlipidemia LDL goal <100 03/16/2008   Qualifier: Diagnosis of  By: Dierdre Harness     Hypothyroidism    followed by pcp   Incisional hernia    Insomnia 12/15/2011   Intermittent palpitations 08/22/2017   Interstitial cystitis    09-13-2018   per pt last flare-up  May 2019 (followed by pcp)   INTERSTITIAL CYSTITIS 02/17/2011   Qualifier: Diagnosis of  By: Moshe Cipro MD, Margaret     Iron deficiency anemia    09-13-2018  PER PT STABLE   Irregular heart rate 07/25/2013   Lipoma of back    upper   Low back pain radiating to right leg 07/04/2019   Low ferritin level 06/14/2014   MDD (major depressive disorder), single episode, in full remission ([place]) 10/27/2017   Medically noncompliant 02/28/2015   Multiple missed appointments, both follow-up appointments and lab appointments.    Metabolic syndrome X 4/31/5400   Qualifier: Diagnosis of  By: Moshe Cipro MD, Margaret  hBA1c is 5.8 in 02/2013    Migraines    Morbid obesity ([place]) 03/27/2013   Muscle spasm 01/03/2020   Nausea alone 07/17/2014   NECK PAIN, CHRONIC 03/16/2008   Qualifier: Diagnosis of  By: Dierdre Harness     Normal coronary arteries    a. by CT 12/2018.   Obesity 03/16/2008   Qualifier: Diagnosis of  By: Dierdre Harness     Oral ulceration 08/23/2014   Presented at 06/14/2014 visit    Other malaise and fatigue 03/16/2008   Centricity Description: FATIGUE, CHRONIC Qualifier: Diagnosis of  By: Dierdre Harness    Centricity Description: FATIGUE Qualifier: Diagnosis of  By: Claybon Jabs PA, Dawn     OVARIAN CYST 12/26/2009   Qualifier: Diagnosis of  By: Moshe Cipro MD, Margaret     Paget disease of breast, left [facility])    Paget's disease of breast, left ([place]) 03/16/2008   Qualifier: Diagnosis  of  By: Dierdre Harness  Left diagnosed in 2006 F/h breast cancer x 15 family members   Partial small bowel obstruction ([place]) 07/04/2012   PONV (postoperative nausea and vomiting)    SEVERE   Postsurgical menopause 11/13/2011   Presence of IVC filter 06/06/2019   PSVT (paroxysmal supraventricular tachycardia) ([place])    [place]   Recurrent oral herpes simplex infection 10/13/2017   ROM (right otitis media) 01/23/2013   S/P insertion of IVC (inferior vena caval) filter 05/08/2005   [place] (non-retrievable)  /  dx 2019  a leg of filter is protruding thru the vena cava in to right L2 vertebral body (09-13-2018  per pt having surgery to remove filter in [place])   S/P radiofrequency ablation operation for arrhythmia 1996   1996  and 1997,   SVT and Atrial Fib   Sinusitis, chronic 01/26/2016   SMALL BOWEL OBSTRUCTION, HX OF 08/07/2008   Annotation: obstruction w/ adhesions led to partial colectomy Qualifier: Diagnosis of  By: Craige Cotta     Stroke [facility])    [place] 10/29/2020, TIAs in the past   Swelling of hand 09/17/2012   Syncope    Tachycardia 02/03/2010   Qualifier: Diagnosis of  By: Via LPN, Lynn     Thyroid disease    Phreesia 10/29/2020   Ulcer 12/15/2011   Urinary frequency 07/18/2014   Vaginitis and vulvovaginitis 05/10/2013   Vitamin D deficiency 05/05/2015   Wears glasses       Past Surgical History:  Procedure Laterality Date   ABDOMINAL HYSTERECTOMY  1987   ANTERIOR CERVICAL DECOMP/DISCECTOMY FUSION  03-07-2002   dr elsner  @[facility]    C 4 -- 5   APPENDECTOMY  1980   AUGMENTATION MAMMAPLASTY Right 2006   BILATERAL SALPINGOOPHORECTOMY  07/24/2011   via Explor. Lap. w/ intraoperative perf.  bowel repair   BIOPSY  05/02/2021   Procedure: BIOPSY;  Surgeon: Montez Morita, Quillian Quince, MD;  [facility];  Service: Gastroenterology;;  small bowel, mid esophagus, distal esophagus, random colon biopsies   BREAST BIOPSY Right 2019   benign   BREAST ENHANCEMENT SURGERY Bilateral 1993   BREAST IMPLANT REMOVAL Bilateral    BREAST SURGERY N/A    Phreesia 10/29/2020   CARDIAC CATHETERIZATION  07-06-2003   dr Darnell Level brodie   normal coronaries and LVF   CARDIAC ELECTROPHYSIOLOGY STUDY AND ABLATION  1996  and 1997   CARDIOVASCULAR STRESS TEST  11-18-2015   dr Caryl Comes   normal nuclear study w/ no ischemia/  normal LV function and wall motion , ef 84%   CARPAL TUNNEL RELEASE Right ?   CESAREAN SECTION  1986   CESAREAN SECTION N/A    Phreesia 10/29/2020   CHOLECYSTECTOMY N/A    Phreesia 10/29/2020   COLON SURGERY     COLONOSCOPY WITH PROPOFOL N/A 05/02/2021   Procedure: COLONOSCOPY WITH PROPOFOL;  Surgeon: Harvel Quale, MD;  [facility];  Service: Gastroenterology;  Laterality: N/A;  12:30 PM   CYSTO/  HYDRODISTENTION/  INSTILATION THERAPY  MULTIPLE   ENTEROCUTANEOUS FISTULA CLOSURE  multiple   last one 2015 with small bowel resection   ESOPHAGOGASTRODUODENOSCOPY (EGD) WITH PROPOFOL N/A 05/02/2021   Procedure: ESOPHAGOGASTRODUODENOSCOPY (EGD) WITH PROPOFOL;  Surgeon: Harvel Quale, MD;  [facility];  Service: Gastroenterology;  Laterality: N/A;   EXPLORATORY LAPAROTOMY INCISIONAL VENTRAL HERNIA REPAIR / RESECTION SMALL BOWEL  11-09-2014   @[facility]    FRACTURE SURGERY N/A    Phreesia 10/29/2020  JOINT REPLACEMENT N/A    Phreesia 10/29/2020   LIPOMA EXCISION Right 09/16/2018   Procedure: EXCISION LIPOMA UPPER BACK;  Surgeon: Clovis Riley, MD;  Location: [place];  Service: General;  Laterality: Right;   MASTECTOMY Left 2006   w/ reconstruction on left (paget's disease)  and right breast augmentation   POLYPECTOMY   05/02/2021   Procedure: POLYPECTOMY;  Surgeon: Harvel Quale, MD;  [facility];  Service: Gastroenterology;;  gastric   SAVORY DILATION  05/02/2021   Procedure: SAVORY DILATION;  Surgeon: Montez Morita, Quillian Quince, MD;  [facility];  Service: Gastroenterology;;   SMALL INTESTINE SURGERY N/A    Phreesia 10/29/2020   SPINE SURGERY N/A    Phreesia 10/29/2020   TOTAL COLECTOMY  08-04-2002    @[facility]    AND CHOLECYSTECTOMY  (colonic inertia)   TRANSTHORACIC ECHOCARDIOGRAM  02/04/2016   ef 60-65%,  grade 2 diastolic dysfunction/  mild MR   TUBAL LIGATION N/A    Phreesia 03/16/2021   VENA CAVA FILTER PLACEMENT  05/08/2005   @[facility]    [place] (non-retrievable)   WIDE EXCISION PERIRECTAL ABSCESSES  09-22-2005   @ [facility]    Chief Complaint  Patient presents with   Loss of Consciousness      HPI:    [name]  is a 60 y.o. female significant for h/o  fibromyalgia, ovarian cancer, left breast cancer, multiple abdominal surgeries  pertaining to a large rectus diastasis with abdominal hernia that began after an ovarian cyst removal that was converted to radical hysterectomy in 2012 at [facility]. This procedure was complicated by sepsis that required enterotomy and small bowel resection followed by chronic wound vac placement.  Patient reports she had a chronic open abdomen from 2012-2015, prior SBO , with s/p  IVC filter March 2017 with subsequent IVC filter malformation and fracture, h/o DVT/pulmonary embolus, anxiety, chronic pain syndrome, HTN, HLD --presents to the ED with complaints of nausea, vomiting concerns about passing out episode -Emesis was without blood or bile -Diarrhea without mucus or blood -No fevers, no chills.   -No chest pains ,  -Potassium was 2.7, creatinine 0.91 -Magnesium 1.3 -UA unremarkable -Hgb 11.1 WBC 8.8 platelets 212 -COVID and flu negative Xray of T-spine chest and ribs without acute findings   Review of systems:     In addition to the HPI above,   A full Review of  Systems was done, all other systems reviewed are negative except as noted above in HPI , .    Social History:  Reviewed by me    Social History   Tobacco Use   Smoking status: Never   Smokeless tobacco: Never  Substance Use Topics   Alcohol use: No    Comment: twice per year       Family History :  Reviewed by me    Family History  Problem Relation Age of Onset   Diabetes Mother    Heart disease Mother    Hypertension Mother    Heart disease Father    Hyperlipidemia Father    Hypertension Father    Alcohol abuse Father    Colon cancer Maternal Aunt    Breast cancer Maternal Aunt    Cancer Maternal Uncle        mets   Bone cancer Maternal Grandfather        mets   Ovarian cancer Cousin 19   Breast cancer Cousin    Prostate cancer Maternal Uncle  Breast cancer Maternal Aunt    Brain cancer Maternal Aunt    Breast cancer Maternal Aunt     Home Medications:   Prior to Admission medications   Medication Sig Start Date End Date Taking? Authorizing Provider  ALPRAZolam [name]) 1 MG tablet Take 1 tablet (1 mg total) by mouth 2 (two) times daily. 08/26/21  Yes Fayrene Helper, MD  aspirin 81 MG chewable tablet Chew 81 mg by mouth daily.   Yes [provider]  butalbital-acetaminophen-caffeine (FIORICET) 50-325-40 MG tablet Take 1 tablet by mouth as directed. 06/19/19  Yes [provider]  conjugated estrogens (PREMARIN) vaginal cream Place fingertip amount intravaginally 2 to 3 times per week Patient taking differently: Place 1 mg vaginally at bedtime. Place fingertip amount intravaginally 2 to 3 times per week 07/12/20  Yes Fayrene Helper, MD  cyclobenzaprine (FLEXERIL) 10 MG tablet TAKE 1 TABLET BY MOUTH TWICE DAILY AS NEEDED FOR MUSCLE SPASMS 02/21/20  Yes Fayrene Helper, MD  cycloSPORINE (RESTASIS) 0.05 % ophthalmic emulsion Place 1 drop into both eyes 2 (two) times daily as needed  (dry eyes).   Yes [provider]  DULoxetine (CYMBALTA) 60 MG capsule Take 60 mg by mouth daily. 04/10/21  Yes [provider]  Erythromycin 250 MG TBEC Take 250 mg by mouth 3 (three) times daily before meals. 11/18/21  Yes Kathie Dike, MD  ezetimibe (ZETIA) 10 MG tablet Take 1 tablet (10 mg total) by mouth daily. 09/28/21  Yes Fayrene Helper, MD  fluticasone (FLONASE) 50 MCG/ACT nasal spray Place 2 sprays into both nostrils daily. Patient taking differently: Place 2 sprays into both nostrils daily as needed for allergies. 10/31/20  Yes Fayrene Helper, MD  HYDROmorphone (DILAUDID) 4 MG tablet Take 4 mg by mouth in the morning, at noon, and at bedtime.   Yes [provider]  levothyroxine (SYNTHROID) 125 MCG tablet TAKE 1 TABLET(125 MCG) BY MOUTH DAILY BEFORE BREAKFAST 08/26/21  Yes Fayrene Helper, MD  magnesium oxide (MAG-OX) 400 (240 Mg) MG tablet Take 1 tablet (400 mg total) by mouth 2 (two) times daily. Please make overdue appt with Dr. Caryl Comes before anymore refills. Thank you 1st attempt Patient taking differently: Take 400 mg by mouth daily. Please make overdue appt with Dr. Caryl Comes before anymore refills. Thank you 1st attempt 09/03/21  Yes Deboraha Sprang, MD  meclizine (ANTIVERT) 25 MG tablet Take 1 tablet (25 mg total) by mouth 3 (three) times daily as needed for dizziness. 10/31/20  Yes Fayrene Helper, MD  omeprazole (PRILOSEC) 40 MG capsule Take 1 capsule (40 mg total) by mouth in the morning and at bedtime. 11/18/21  Yes Kathie Dike, MD  pravastatin (PRAVACHOL) 40 MG tablet TAKE 1 TABLET(40 MG) BY MOUTH DAILY Patient taking differently: Take 40 mg by mouth daily. TAKE 1 TABLET(40 MG) BY MOUTH DAILY 01/15/21  Yes Fayrene Helper, MD  senna-docusate (SENOKOT-S) 8.6-50 MG per tablet Take 2 tablets by mouth 2 (two) times daily. constipation   Yes [provider]  sucralfate (CARAFATE) 1 g tablet Take 1 tablet (1 g total) by mouth 4  (four) times daily -  with meals and at bedtime. 11/18/21  Yes Memon, Jolaine Artist, MD  XARELTO 20 MG TABS tablet TAKE 1 TABLET(20 MG) BY MOUTH DAILY Patient taking differently: Take 20 mg by mouth daily with supper. 10/03/21  Yes Lindell Spar, MD  cyanocobalamin (,VITAMIN B-12,) 1000 MCG/ML injection Inject 1 mL (1,000 mcg total) into the muscle  every 30 (thirty) days. 09/18/21   Harriett Rush, PA-C  metoprolol tartrate (LOPRESSOR) 25 MG tablet Take 1 tablet (25 mg total) by mouth 2 (two) times daily. Patient not taking: Reported on 12/02/2021 01/15/21   Fayrene Helper, MD  Multiple Vitamins-Minerals (ICAPS AREDS 2) CAPS Take 1 capsule by mouth at bedtime. Patient not taking: Reported on 12/02/2021    [provider]  predniSONE (DELTASONE) 10 MG tablet Take 40mg  po daily for 2 days then 30mg  daily for 2 days then 20mg  daily for 2 days then 10mg  daily for 2 days then stop Patient not taking: Reported on 12/02/2021 11/18/21   Kathie Dike, MD  promethazine (PHENERGAN) 25 MG tablet Take by mouth. Patient not taking: Reported on 12/02/2021 07/29/18   [provider]  Prucalopride Succinate (MOTEGRITY) 1 MG TABS Take 1 mg by mouth daily at 2 PM. Patient not taking: Reported on 12/02/2021 11/27/21   Harvel Quale, MD  rifaximin (XIFAXAN) 550 MG TABS tablet Take 1 tablet (550 mg total) by mouth 3 (three) times daily. Patient not taking: Reported on 12/02/2021 11/18/21   Kathie Dike, MD  Specialty Vitamins Products (VITAMINS FOR HAIR PO) Take 1 capsule by mouth daily. Patient not taking: Reported on 12/02/2021    [provider]  vitamin C (ASCORBIC ACID) 500 MG tablet Take 500 mg by mouth at bedtime. Patient not taking: Reported on 12/02/2021    [provider]     Allergies:     Allergies  Allergen Reactions   Nitrofurantoin Hives   Crestor [Rosuvastatin Calcium] Other (See Comments)    Generalized cramps   Bisacodyl Other (See Comments)     Makes patient feel like she is having cramps Makes patient feel like she is having cramps   Clarithromycin Hives and Other (See Comments)    Other reaction(s): Other (See Comments) Cluster migraines  Cluster migraines   Clarithromycin Other (See Comments)    Cluster migraines    Clindamycin Hcl Hives   Codeine Itching   Iron Sucrose Other (See Comments)    Flushing- required benadryl and solu medrol   Monosodium Glutamate Other (See Comments)    Cluster migraines  Cluster migraines   Scopolamine Hbr Other (See Comments)    Cluster migraines, impaired vision     Physical Exam:   Vitals  Blood pressure (!) 157/84, pulse 80, temperature 99 F (37.2 C), temperature source Oral, resp. rate 18, height 5\' 4"  (1.626 m), weight 83.5 kg, SpO2 98 %.  Physical Examination: General appearance - alert, chronically ill-appearing and in no distress  Mental status - alert, oriented to person, place, and time,  Eyes - sclera anicteric Neck - supple, no JVD elevation , Chest - clear  to auscultation bilaterally, symmetrical air movement,  Heart - S1 and S2 normal, regular  Abdomen - soft, +BS, nondistended, Mild diffuse abdominal tenderness, extensive abdominal scarring with large supraumbilical hernia, No hepatosplenomegaly.  Neurological - screening mental status exam normal, neck supple without rigidity, cranial nerves II through XII intact, DTR's normal and symmetric Extremities - no pedal edema noted, intact peripheral pulses  Skin - warm, dry     Data Review:    CBC Recent Labs  Lab 12/01/21 1650  WBC 8.8  HGB 11.8*  HCT 36.0  PLT 212  MCV 91.6  MCH 30.0  MCHC 32.8  RDW 14.2   ------------------------------------------------------------------------------------------------------------------  Chemistries  Recent Labs  Lab 12/01/21 1650 12/02/21 1509  NA 136 138  K 2.7* 3.1*  CL 99 105  CO2 31 24  GLUCOSE 125* 107*  BUN 17 9  CREATININE 0.91 0.66  CALCIUM  8.0* 7.6*  MG 1.3*  --    ------------------------------------------------------------------------------------------------------------------ estimated creatinine clearance is 78.2 mL/min (by C-G formula based on SCr of 0.66 mg/dL). ------------------------------------------------------------------------------------------------------------------ No results for input(s): TSH, T4TOTAL, T3FREE, THYROIDAB in the last 72 hours.  Invalid input(s): FREET3   Coagulation profile No results for input(s): INR, PROTIME in the last 168 hours. ------------------------------------------------------------------------------------------------------------------- No results for input(s): DDIMER in the last 72 hours. -------------------------------------------------------------------------------------------------------------------  Cardiac Enzymes No results for input(s): CKMB, TROPONINI, MYOGLOBIN in the last 168 hours.  Invalid input(s): CK ------------------------------------------------------------------------------------------------------------------ No results found for: BNP   ---------------------------------------------------------------------------------------------------------------  Urinalysis    Component Value Date/Time   COLORURINE YELLOW 12/01/2021 2010   APPEARANCEUR CLEAR 12/01/2021 2010   LABSPEC 1.008 12/01/2021 2010   PHURINE 7.0 12/01/2021 2010   GLUCOSEU NEGATIVE 12/01/2021 2010   GLUCOSEU NEG mg/dL 09/05/2007 2337   HGBUR NEGATIVE 12/01/2021 2010   HGBUR negative 03/13/2011 0957   BILIRUBINUR NEGATIVE 12/01/2021 2010   BILIRUBINUR small (A) 07/18/2021 1411   BILIRUBINUR neg 06/23/2018 1434   [place] 12/01/2021 2010   PROTEINUR NEGATIVE 12/01/2021 2010   UROBILINOGEN 4.0 (A) 07/18/2021 1411   UROBILINOGEN 0.2 07/03/2015 2014   NITRITE NEGATIVE 12/01/2021 2010   LEUKOCYTESUR NEGATIVE 12/01/2021 2010     ----------------------------------------------------------------------------------------------------------------   Imaging Results:    DG Ribs Unilateral W/Chest Right  Result Date: 12/01/2021 CLINICAL DATA:  Recent syncopal episode with fall and right chest pain, initial encounter EXAM: RIGHT RIBS AND CHEST - 3+ VIEW COMPARISON:  None. FINDINGS: Cardiac shadow is within normal limits. Lungs are well aerated bilaterally. Postsurgical changes in the cervical spine are seen. No rib abnormality noted. IMPRESSION: No acute abnormality seen. Electronically Signed   By: Inez Catalina M.D.   On: 12/01/2021 22:22   DG Thoracic Spine 2 View  Result Date: 12/01/2021 CLINICAL DATA:  Fall. EXAM: THORACIC SPINE 2 VIEWS COMPARISON:  None. FINDINGS: There is no evidence of thoracic spine fracture. Alignment is normal. No other significant bone abnormalities are identified. C4-C5 anterior fusion plate is present. IMPRESSION: Negative. Electronically Signed   By: Ronney Asters M.D.   On: 12/01/2021 22:21    Radiological Exams on Admission: DG Ribs Unilateral W/Chest Right  Result Date: 12/01/2021 CLINICAL DATA:  Recent syncopal episode with fall and right chest pain, initial encounter EXAM: RIGHT RIBS AND CHEST - 3+ VIEW COMPARISON:  None. FINDINGS: Cardiac shadow is within normal limits. Lungs are well aerated bilaterally. Postsurgical changes in the cervical spine are seen. No rib abnormality noted. IMPRESSION: No acute abnormality seen. Electronically Signed   By: Inez Catalina M.D.   On: 12/01/2021 22:22   DG Thoracic Spine 2 View  Result Date: 12/01/2021 CLINICAL DATA:  Fall. EXAM: THORACIC SPINE 2 VIEWS COMPARISON:  None. FINDINGS: There is no evidence of thoracic spine fracture. Alignment is normal. No other significant bone abnormalities are identified. C4-C5 anterior fusion plate is present. IMPRESSION: Negative. Electronically Signed   By: Ronney Asters M.D.   On: 12/01/2021 22:21    DVT  Prophylaxis -SCD  AM Labs Ordered, also please review Full Orders  Family Communication: Admission, patients condition and plan of care including tests being ordered have been discussed with the patient  who indicate understanding and agree with the plan   Code Status - Full Code  Likely DC to home after BP improved on tolerate oral intake  Condition   stable  Roxan Hockey M.D on 12/02/2021 at 7:10 PM Go to [email or web address] -  for contact info  [facility] - Office  [phone]

## 2021-12-02 NOTE — ED Provider Notes (Signed)
1:19 AM Assumed care from Dr. Eulis Foster, please see their note for full history, physical and decision making until this point. In brief this is a 60 y.o. year old female who presented to the ED tonight with Loss of Consciousness     Patient here with syncope. Soft pressures. Hospitalist aware, pending fluids and reevaluation for stability.   Reevaluated patient multiple times.  She is on her phone.  She is able to walk around the department.  She does have blood pressure still in the mid 88P systolic however suspect That she is perfusing just fine and this may be her baseline.  A manual pressure was 102/81.  At this time I feel the patient is stable for admission for work-up as previous provider requested.   Labs, studies and imaging reviewed by myself and considered in medical decision making if ordered. Imaging interpreted by radiology.  Labs Reviewed  BASIC METABOLIC PANEL - Abnormal; Notable for the following components:      Result Value   Potassium 2.7 (*)    Glucose, Bld 125 (*)    Calcium 8.0 (*)    All other components within normal limits  CBC - Abnormal; Notable for the following components:   Hemoglobin 11.8 (*)    All other components within normal limits  MAGNESIUM - Abnormal; Notable for the following components:   Magnesium 1.3 (*)    All other components within normal limits  RESP PANEL BY RT-PCR (FLU A&B, COVID) [id number]  URINALYSIS, ROUTINE W REFLEX MICROSCOPIC    DG Thoracic Spine 2 View  Final Result    DG Ribs Unilateral W/Chest Right  Final Result      No follow-ups on file.    Undra Harriman, Corene Cornea, MD 12/02/21 [phone]

## 2021-12-03 ENCOUNTER — Encounter (HOSPITAL_COMMUNITY)
Admission: EM | Disposition: A | Discharge status: Home / Self Care | Payer: Self-pay | Source: Home / Self Care | Attending: Family Medicine

## 2021-12-03 ENCOUNTER — Encounter (HOSPITAL_COMMUNITY): Payer: Self-pay | Admitting: Certified Registered"

## 2021-12-03 DIAGNOSIS — R1084 Generalized abdominal pain: Secondary | ICD-10-CM | POA: Diagnosis not present

## 2021-12-03 DIAGNOSIS — R112 Nausea with vomiting, unspecified: Secondary | ICD-10-CM

## 2021-12-03 DIAGNOSIS — E876 Hypokalemia: Secondary | ICD-10-CM | POA: Diagnosis not present

## 2021-12-03 DIAGNOSIS — R55 Syncope and collapse: Secondary | ICD-10-CM

## 2021-12-03 DIAGNOSIS — D649 Anemia, unspecified: Secondary | ICD-10-CM | POA: Diagnosis not present

## 2021-12-03 DIAGNOSIS — D509 Iron deficiency anemia, unspecified: Secondary | ICD-10-CM | POA: Diagnosis not present

## 2021-12-03 LAB — CBC
HCT: 34.4 % — ABNORMAL LOW (ref 36.0–46.0)
Hemoglobin: 11 g/dL — ABNORMAL LOW (ref 12.0–15.0)
MCH: 29.7 pg (ref 26.0–34.0)
MCHC: 32 g/dL (ref 30.0–36.0)
MCV: 93 fL (ref 80.0–100.0)
Platelets: 189 10*3/uL (ref 150–400)
RBC: 3.7 MIL/uL — ABNORMAL LOW (ref 3.87–5.11)
RDW: 14.6 % (ref 11.5–15.5)
WBC: 4.6 10*3/uL (ref 4.0–10.5)
nRBC: 0 % (ref 0.0–0.2)

## 2021-12-03 LAB — GLUCOSE, CAPILLARY
Glucose-Capillary: 90 mg/dL (ref 70–99)
Glucose-Capillary: 91 mg/dL (ref 70–99)
Glucose-Capillary: 84 mg/dL (ref 70–99)
Glucose-Capillary: 121 mg/dL — ABNORMAL HIGH (ref 70–99)

## 2021-12-03 LAB — RENAL FUNCTION PANEL
Albumin: 2.7 g/dL — ABNORMAL LOW (ref 3.5–5.0)
Anion gap: 7 (ref 5–15)
BUN: 6 mg/dL (ref 6–20)
CO2: 28 mmol/L (ref 22–32)
Calcium: 8.3 mg/dL — ABNORMAL LOW (ref 8.9–10.3)
Chloride: 106 mmol/L (ref 98–111)
Creatinine, Ser: 0.66 mg/dL (ref 0.44–1.00)
GFR, Estimated: >60 mL/min (ref >60)
Glucose, Bld: 91 mg/dL (ref 70–99)
Phosphorus: 3.8 mg/dL (ref 2.5–4.6)
Potassium: 2.7 mmol/L — CL (ref 3.5–5.1)
Sodium: 141 mmol/L (ref 135–145)

## 2021-12-03 LAB — BASIC METABOLIC PANEL
Anion gap: 8 (ref 5–15)
BUN: 6 mg/dL (ref 6–20)
CO2: 29 mmol/L (ref 22–32)
Calcium: 8.4 mg/dL — ABNORMAL LOW (ref 8.9–10.3)
Chloride: 106 mmol/L (ref 98–111)
Creatinine, Ser: 0.64 mg/dL (ref 0.44–1.00)
GFR, Estimated: >60 mL/min (ref >60)
Glucose, Bld: 93 mg/dL (ref 70–99)
Potassium: 2.8 mmol/L — ABNORMAL LOW (ref 3.5–5.1)
Sodium: 143 mmol/L (ref 135–145)

## 2021-12-03 LAB — MAGNESIUM: Magnesium: 1.6 mg/dL — ABNORMAL LOW (ref 1.7–2.4)

## 2021-12-03 LAB — C DIFFICILE QUICK SCREEN W PCR REFLEX
C Diff antigen: NEGATIVE
C Diff interpretation: NOT DETECTED
C Diff toxin: NEGATIVE

## 2021-12-03 SURGERY — ESOPHAGOGASTRODUODENOSCOPY (EGD) WITH PROPOFOL
Anesthesia: Monitor Anesthesia Care

## 2021-12-03 MED ORDER — POTASSIUM CHLORIDE 10 MEQ/100ML IV SOLN
10.0000 meq | INTRAVENOUS | Status: AC
  Administered 2021-12-03 (×4): 10 meq via INTRAVENOUS
  Filled 2021-12-03 (×4): qty 100

## 2021-12-03 MED ORDER — MAGNESIUM SULFATE 4 GM/100ML IV SOLN
4.0000 g | Freq: Once | INTRAVENOUS | Status: AC
  Administered 2021-12-03: 4 g via INTRAVENOUS
  Filled 2021-12-03: qty 100

## 2021-12-03 MED ORDER — POTASSIUM CHLORIDE 10 MEQ/100ML IV SOLN: 10.0000 meq | INTRAVENOUS | Status: DC

## 2021-12-03 MED ORDER — SODIUM CHLORIDE 0.9 % IV BOLUS
500.0000 mL | Freq: Once | INTRAVENOUS | Status: AC
  Administered 2021-12-03: 500 mL via INTRAVENOUS

## 2021-12-03 MED ORDER — SODIUM CHLORIDE 0.9 % IV SOLN: INTRAVENOUS | Status: DC

## 2021-12-03 MED ORDER — POTASSIUM CHLORIDE 20 MEQ PO PACK
40.0000 meq | PACK | Freq: Once | ORAL | Status: AC
  Administered 2021-12-03: 40 meq via ORAL
  Filled 2021-12-03: qty 2

## 2021-12-03 NOTE — Care Management Obs Status (Signed)
[place] NOTIFICATION   Patient Details  Name: [name] MRN: [id number] Date of Birth: [birth year]   Medicare Observation Status Notification Given:  Yes    Tommy Medal 12/03/2021, 4:02 PM

## 2021-12-03 NOTE — Telephone Encounter (Signed)
error 

## 2021-12-03 NOTE — Progress Notes (Signed)
C/O nausea and unable to take po potassium and magox doses for night shift.  Having rib pain and spoke with Dr. Orie Rout about the possibility of having these meds ordered IV.  He ordered ns bolus to help increase BP to tolerated iv pain medication.

## 2021-12-03 NOTE — Progress Notes (Signed)
Subjective: Having some nausea this morning but was able to eat soup last night for dinner and tolerated it well. Had 1 BM this morning that was diarrhea, denies blood in stools or black stools. Continues with abdominal cramping. She reports she is hungry this morning but knows that eating food will likely induce vomiting. Reports continued difficulty taking her pills as they seem to cause her worsening nausea.   Objective: Vital signs in last 24 hours: Temp:  [97.8 F (36.6 C)-99 F (37.2 C)] 97.8 F (36.6 C) (12/07 0629) Pulse Rate:  [66-80] 70 (12/07 0640) Resp:  [16-20] 20 (12/07 0629) BP: (80-157)/(38-84) 85/55 (12/07 0640) SpO2:  [93 %-100 %] 99 % (12/07 0629) Last BM Date: 12/02/21 General:   Alert and oriented, pleasant Head:  Normocephalic and atraumatic. Eyes:  No icterus, sclera clear. Conjuctiva pink.  Mouth:  Without lesions, mucosa pink and moist.  Heart:  S1, S2 present, no murmurs noted.  Lungs: Clear to auscultation bilaterally, without wheezing, rales, or rhonchi.  Abdomen:  Bowel sounds present, soft, non-distended. No HSM or hernias noted. Multiple surgical scars, ttp of mid to lower abdomen, no rebound or guarding Msk:  Symmetrical without gross deformities. Normal posture. Pulses:  Normal pulses noted. Extremities:  Without clubbing or edema. Neurologic:  Alert and  oriented x4;  grossly normal neurologically. Skin:  Warm and dry, intact without significant lesions.  Psych:  Alert and cooperative. Normal mood and affect.  Intake/Output from previous day: 12/06 0701 - 12/07 0700 In: 1549.3 [IV Piggyback:1549.3] Out: 2 [Urine:2] Intake/Output this shift: No intake/output data recorded.  Lab Results: Recent Labs    12/01/21 1650 12/03/21 0435  WBC 8.8 4.6  HGB 11.8* 11.0*  HCT 36.0 34.4*  PLT 212 189   BMET Recent Labs    12/01/21 1650 12/02/21 1509 12/03/21 0435  NA 136 138 141  143  K 2.7* 3.1* 2.7*  2.8*  CL 99 105 106  106  CO2 31 24  28  29   GLUCOSE 125* 107* 91  93  BUN 17 9 6  6   CREATININE 0.91 0.66 0.66  0.64  CALCIUM 8.0* 7.6* 8.3*  8.4*   LFT Recent Labs    12/02/21 1509 12/03/21 0435  ALBUMIN 2.8* 2.7*   Studies/Results: DG Ribs Unilateral W/Chest Right  Result Date: 12/01/2021 CLINICAL DATA:  Recent syncopal episode with fall and right chest pain, initial encounter EXAM: RIGHT RIBS AND CHEST - 3+ VIEW COMPARISON:  None. FINDINGS: Cardiac shadow is within normal limits. Lungs are well aerated bilaterally. Postsurgical changes in the cervical spine are seen. No rib abnormality noted. IMPRESSION: No acute abnormality seen. Electronically Signed   By: Inez Catalina M.D.   On: 12/01/2021 22:22   DG Thoracic Spine 2 View  Result Date: 12/01/2021 CLINICAL DATA:  Fall. EXAM: THORACIC SPINE 2 VIEWS COMPARISON:  None. FINDINGS: There is no evidence of thoracic spine fracture. Alignment is normal. No other significant bone abnormalities are identified. C4-C5 anterior fusion plate is present. IMPRESSION: Negative. Electronically Signed   By: Ronney Asters M.D.   On: 12/01/2021 22:21    Assessment: [name] is a 60 year old female with significant past medical history, of partial colectomy due to redundant colon with primary anastomosis, perforated bowel r/t gynecologic procedure complicated by enterocutaneous fistual that required multiple surgeries and multiple SBOs, with recent admission on 10/29 for partial SBO, treated with bowel rest, admitted again in NOv due to ongoing nausea, vomiting, diarrhea and abdominal pain, with  suspected psudeoobstrucion, possibly gastroparesis, and small bowel dysmotility, but stricture remains in the differentials given hx of multiple GI surgeries. She presented to ER Monday after syncopal episode at home with ongoing nausea/vomiting, and abdominal distention.  She continues to have nausea off and on, making swallowing pills difficult as this often induces more nausea, solid foods  usually are vomited back up, though she was able to tolerate soup at dinner last night with no issue. Notably, her hgb is 11 today, down from 13.4 two weeks ago, one episode of loose stools today without overt bleeding or melena. EGD planned for today for further evaluation of her UGI symptoms as well as melena was postponed due to continued hypokalemia and hypotension. Plans to proceed with EGD tomorrow given potassium and BP have improved. If EGD is normal, erythromycin will be started, she will also likely benefit from repeat esophagram. Will consider domperidone and prucalopride on outpatient basis, if patient is able to obtain these as her ongoing nausea/vomiting are likely driven by generalized GI dysmotility/gastroparesis.    Plan: Possible EGD tomorrow if BP and K+ improved Clear liquid diet today Continue supportive measures Continue PPI BID Consider domperidone and prucalopride on outpatient basis   LOS: 0 days    12/03/2021, 10:03 AM   Marc Leichter L. Alver Sorrow, MSN, APRN, AGNP-C Adult-Gerontology Nurse Practitioner [facility]

## 2021-12-03 NOTE — Telephone Encounter (Signed)
Medication was denied. Dr. Jenetta Downer made aware.

## 2021-12-03 NOTE — Progress Notes (Signed)
   12/02/21 2221 12/02/21 2333  Assess: MEWS Score  Temp 98.7 F (37.1 C) 98.4 F (36.9 C)  BP (!) 80/55 (!) 87/51  Pulse Rate 67 70  Resp 16 16  SpO2 93 %  --   O2 Device Room Air  --   Assess: MEWS Score  MEWS Temp 0 0  MEWS Systolic 2 1  MEWS Pulse 0 0  MEWS RR 0 0  MEWS LOC 0 0  MEWS Score 2 1  MEWS Score Color Yellow Green  Assess: if the MEWS score is Yellow or Red  Were vital signs taken at a resting state? Yes  --   Focused Assessment Change from prior assessment (see assessment flowsheet)  --   Early Detection of Sepsis Score *See Row Information* Medium  --   MEWS guidelines implemented *See Row Information* Yes  --   Take Vital Signs  Increase Vital Sign Frequency  Yellow: Q 2hr X 2 then Q 4hr X 2, if remains yellow, continue Q 4hrs  --   Escalate  MEWS: Escalate Yellow: discuss with charge nurse/RN and consider discussing with provider and RRT  --   Notify: Charge Nurse/RN  Name of Charge Nurse/RN Notified Reather Converse, RN  --   Date Charge Nurse/RN Notified 12/03/21  --   Time Charge Nurse/RN Notified 2230  --   Document  Patient Outcome Other (Comment) (will continue to monitor, patient stable)  --   Progress note created (see row info) Yes  --

## 2021-12-03 NOTE — Progress Notes (Signed)
   12/02/21 2333  Assess: MEWS Score  Temp 98.4 F (36.9 C)  BP (!) 87/51  Pulse Rate 70  Resp 16  SpO2 96 %  Assess: MEWS Score  MEWS Temp 0  MEWS Systolic 1  MEWS Pulse 0  MEWS RR 0  MEWS LOC 0  MEWS Score 1  MEWS Score Color Green  Assess: if the MEWS score is Yellow or Red  Were vital signs taken at a resting state? Yes  Focused Assessment Change from prior assessment (see assessment flowsheet)  Early Detection of Sepsis Score *See Row Information* Low  MEWS guidelines implemented *See Row Information* Yes  Take Vital Signs  Increase Vital Sign Frequency  Yellow: Q 2hr X 2 then Q 4hr X 2, if remains yellow, continue Q 4hrs  Escalate  MEWS: Escalate Yellow: discuss with charge nurse/RN and consider discussing with provider and RRT  Notify: Charge Nurse/RN  Name of Charge Nurse/RN Notified Reather Converse,  Date Charge Nurse/RN Notified 12/03/21  Time Charge Nurse/RN Notified 2345  Document  Patient Outcome Other (Comment) (will monitor)  Progress note created (see row info) Yes

## 2021-12-03 NOTE — Progress Notes (Signed)
PROGRESS NOTE   [name]  OAC:[id number] DOB: 1961/09/17 DOA: 12/01/2021 PCP: Fayrene Helper, MD   Chief Complaint  Patient presents with   Loss of Consciousness   Level of care: Telemetry  Brief Admission History:  60 y.o. female significant for h/o  fibromyalgia, ovarian cancer, left breast cancer, multiple abdominal surgeries  pertaining to a large rectus diastasis with abdominal hernia that began after an ovarian cyst removal that was converted to radical hysterectomy in 2012 at [facility]. This procedure was complicated by sepsis that required enterotomy and small bowel resection followed by chronic wound vac placement.  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  Pt presents to the ED with complaints of nausea, vomiting and concerns about passing out episode at home prior to arrival.  She has a long history of slow gastric emptying and has been hospitalized at [facility] as recently as June of this year for similar symptoms.   Assessment & Plan:   Principal Problem:   Hypokalemia Active Problems:   Iron deficiency anemia   Nausea and vomiting   Syncope   Abdominal pain   Essential hypertension   Hypomagnesemia   Dark stools   Anemia  Hypokalemia and Hypomagnesemia - IV replacement being ordered - Oral potassium additional replacement ordered for this evening - follow up BMP in AM  Dehydration  - Pt remains very dehydrated - start IV fluids and run overnight  Intractable N/V/D - this is not new - follow stool studies - GI planning EGD when medically stable - IV fluid ordered - clear liquid diet as tolerated ordered.   Syncope - likely due to dehydration and electrolyte abnormalities - treating as above - xrays in ED without findings of acute fracture or  injury  Essential hypertension  - BPs are soft, holding BP meds temporarily - IV fluids ordered for dehydration  History of DVT/PT - resumed home rivaroxaban  Chronic pain/opioid dependence - she is managed by a specialty pain management clinic  Hypothyroidism  - resume home levothyroxine   Anemia unspecified - GI consulted and planning EGD.  - recheck CBC in AM.     DVT prophylaxis:  rivaroxaban  Code Status: full  Family Communication: patient at bedside updated  Disposition: anticipating DC home in 1-2 days  Status is: Observation  The patient will require care spanning > 2 midnights and should be moved to inpatient because: IV fluids required, hypotension   Consultants:  GI   Procedures:  EGD planned (tentative)   Antimicrobials:     Subjective: Pt reports that she cannot swallow pills.  She says that her mouth is dry and she feels dehydrated.   Objective: Vitals:   12/03/21 0530 12/03/21 0532 12/03/21 0629 12/03/21 0640  BP: (!) 85/58 (!) 102/38 98/65 (!) 85/55  Pulse: 71 73 75 70  Resp:   20   Temp:   97.8 F (36.6 C)   TempSrc:   Oral   SpO2:   99%   Weight:      Height:        Intake/Output Summary (Last 24 hours) at 12/03/2021 1258 Last data filed at 12/03/2021 0650 Gross per 24 hour  Intake --  Output 2 ml  Net -2 ml   Filed Weights   12/01/21 1619  Weight: 83.5 kg  Examination:  General exam: awake, alert, cooperative, Appears calm and comfortable  Respiratory system: Clear to auscultation. Respiratory effort normal. Cardiovascular system: normal S1 & S2 heard. No JVD, murmurs, rubs, gallops or clicks. No pedal edema. Gastrointestinal system: Abdomen is nondistended, soft and nontender. No organomegaly or masses felt. Normal bowel sounds heard. Central nervous system: Alert and oriented. No focal neurological deficits. Extremities: Symmetric 5 x 5 power. Skin: No rashes, lesions or ulcers Psychiatry: Judgement and insight appear  normal. Mood & affect appropriate.   Data Reviewed: I have personally reviewed following labs and imaging studies  CBC: Recent Labs  Lab 12/01/21 1650 12/03/21 0435  WBC 8.8 4.6  HGB 11.8* 11.0*  HCT 36.0 34.4*  MCV 91.6 93.0  PLT 212 921    Basic Metabolic Panel: Recent Labs  Lab 12/01/21 1650 12/02/21 1509 12/03/21 0435  NA 136 138 141  143  K 2.7* 3.1* 2.7*  2.8*  CL 99 105 106  106  CO2 31 24 28  29   GLUCOSE 125* 107* 91  93  BUN 17 9 6  6   CREATININE 0.91 0.66 0.66  0.64  CALCIUM 8.0* 7.6* 8.3*  8.4*  MG 1.3*  --  1.6*  PHOS  --  2.6 3.8    GFR: Estimated Creatinine Clearance: 78.2 mL/min (by C-G formula based on SCr of 0.66 mg/dL).  Liver Function Tests: Recent Labs  Lab 12/02/21 1509 12/03/21 0435  ALBUMIN 2.8* 2.7*    CBG: Recent Labs  Lab 12/03/21 0730 12/03/21 1143  GLUCAP 90 91    Recent Results (from the past 240 hour(s))  Resp Panel by RT-PCR (Flu A&B, Covid) Nasopharyngeal Swab     Status: None   Collection Time: 12/01/21  9:33 PM   Specimen: Nasopharyngeal Swab; Nasopharyngeal(NP) swabs in vial transport medium  Result Value Ref Range Status   SARS Coronavirus 2 by RT PCR NEGATIVE NEGATIVE Final    Comment: (NOTE) SARS-CoV-2 target nucleic acids are NOT DETECTED.  The SARS-CoV-2 RNA is generally detectable in upper respiratory specimens during the acute phase of infection. The lowest concentration of SARS-CoV-2 viral copies this assay can detect is 138 copies/mL. A negative result does not preclude SARS-Cov-2 infection and should not be used as the sole basis for treatment or other patient management decisions. A negative result may occur with  improper specimen collection/handling, submission of specimen other than nasopharyngeal swab, presence of viral mutation(s) within the areas targeted by this assay, and inadequate number of viral copies(<138 copies/mL). A negative result must be combined with clinical observations,  patient history, and epidemiological information. The expected result is Negative.  Fact Sheet for Patients:  EntrepreneurPulse.com.au  Fact Sheet for Healthcare Providers:  IncredibleEmployment.be  This test is no t yet approved or cleared by the [place] FDA and  has been authorized for detection and/or diagnosis of SARS-CoV-2 by FDA under an Emergency Use Authorization (EUA). This EUA will remain  in effect (meaning this test can be used) for the duration of the COVID-19 declaration under Section 564(b)(1) of the Act, 21 U.S.C.section 360bbb-3(b)(1), unless the authorization is terminated  or revoked sooner.       Influenza A by PCR NEGATIVE NEGATIVE Final   Influenza B by PCR NEGATIVE NEGATIVE Final    Comment: (NOTE) The Xpert Xpress SARS-CoV-2/FLU/RSV plus assay is intended as an aid in the diagnosis of influenza from Nasopharyngeal swab specimens and should not be used as a sole basis for treatment. Nasal washings and aspirates are  unacceptable for Xpert Xpress SARS-CoV-2/FLU/RSV testing.  Fact Sheet for Patients: EntrepreneurPulse.com.au  Fact Sheet for Healthcare Providers: IncredibleEmployment.be  This test is not yet approved or cleared by the [place] FDA and has been authorized for detection and/or diagnosis of SARS-CoV-2 by FDA under an Emergency Use Authorization (EUA). This EUA will remain in effect (meaning this test can be used) for the duration of the COVID-19 declaration under Section 564(b)(1) of the Act, 21 U.S.C. section 360bbb-3(b)(1), unless the authorization is terminated or revoked.  Performed at [facility], [address], [place], [place] [address]      Radiology Studies: DG Ribs Unilateral W/Chest Right  Result Date: 12/01/2021 CLINICAL DATA:  Recent syncopal episode with fall and right chest pain, initial encounter EXAM: RIGHT RIBS AND CHEST - 3+ VIEW  COMPARISON:  None. FINDINGS: Cardiac shadow is within normal limits. Lungs are well aerated bilaterally. Postsurgical changes in the cervical spine are seen. No rib abnormality noted. IMPRESSION: No acute abnormality seen. Electronically Signed   By: Inez Catalina M.D.   On: 12/01/2021 22:22   DG Thoracic Spine 2 View  Result Date: 12/01/2021 CLINICAL DATA:  Fall. EXAM: THORACIC SPINE 2 VIEWS COMPARISON:  None. FINDINGS: There is no evidence of thoracic spine fracture. Alignment is normal. No other significant bone abnormalities are identified. C4-C5 anterior fusion plate is present. IMPRESSION: Negative. Electronically Signed   By: Ronney Asters M.D.   On: 12/01/2021 22:21    Scheduled Meds:  aspirin EC  81 mg Oral Q breakfast   DULoxetine  60 mg Oral Daily   erythromycin  250 mg Oral TID AC   ezetimibe  10 mg Oral Daily   insulin aspart  0-5 Units Subcutaneous QHS   insulin aspart  0-6 Units Subcutaneous TID WC   levothyroxine  125 mcg Oral Q0600   magnesium oxide  400 mg Oral BID   multivitamin-lutein  1 capsule Oral QHS   pantoprazole  40 mg Oral BID   polyethylene glycol  17 g Oral Daily   potassium chloride  40 mEq Oral Once   pravastatin  40 mg Oral QPM   rifaximin  550 mg Oral TID   senna-docusate  2 tablet Oral BID   sodium chloride flush  3 mL Intravenous Q12H   sodium chloride flush  3 mL Intravenous Q12H   sucralfate  1 g Oral TID WC & HS   Continuous Infusions:  sodium chloride     magnesium sulfate bolus IVPB     potassium chloride 10 mEq (12/03/21 1231)    LOS: 0 days   Time spent: 37 mins   Jonice Cerra Wynetta Emery, MD How to contact the [facility] Attending or Consulting provider [place] or covering provider during after hours [place], for this patient?  Check the care team in [facility] and look for a) attending/consulting TR[facility] provider listed and b) the [facility] team listed Log into [email or web address] and use [place]'s universal password to access. If you do not have the password, please  contact the hospital operator. Locate the [facility] provider you are looking for under [facility] and page to a number that you can be directly reached. If you still have difficulty reaching the provider, please page the [facility] (Director on Call) for the Hospitalists listed on amion for assistance.  12/03/2021, 12:58 PM

## 2021-12-03 NOTE — Progress Notes (Signed)
   12/02/21 2221 12/02/21 2333 12/03/21 0119  Assess: MEWS Score  Temp 98.7 F (37.1 C) 98.4 F (36.9 C) 98 F (36.7 C)  BP (!) 80/55 (!) 87/51 94/62  Pulse Rate 67 70 66  Resp 16 16 19   SpO2  --   --  94 %  O2 Device  --   --  Room Air  Assess: MEWS Score  MEWS Temp 0 0 0  MEWS Systolic 2 1 1   MEWS Pulse 0 0 0  MEWS RR 0 0 0  MEWS LOC 0 0 0  MEWS Score 2 1 1   MEWS Score Color Yellow Green Green  Assess: if the MEWS score is Yellow or Red  Were vital signs taken at a resting state?  --   --  Yes  Focused Assessment  --   --  No change from prior assessment  Early Detection of Sepsis Score *See Row Information*  --   --  Low  MEWS guidelines implemented *See Row Information*  --   --  No, previously yellow, continue vital signs every 4 hours  Take Vital Signs  Increase Vital Sign Frequency   --   --  Yellow: Q 2hr X 2 then Q 4hr X 2, if remains yellow, continue Q 4hrs  Notify: Charge Nurse/RN  Name of Charge Nurse/RN Notified  --   --  Reather Converse  Date Charge Nurse/RN Notified  --   --  12/03/21  Time Charge Nurse/RN Notified  --   --  2957  Document  Patient Outcome  --   --  Other (Comment) (stable)  Progress note created (see row info)  --   --  Yes

## 2021-12-04 ENCOUNTER — Inpatient Hospital Stay (HOSPITAL_COMMUNITY): Payer: Medicare Other | Admitting: Anesthesiology

## 2021-12-04 ENCOUNTER — Encounter (HOSPITAL_COMMUNITY): Payer: Self-pay | Admitting: Family Medicine

## 2021-12-04 ENCOUNTER — Encounter (HOSPITAL_COMMUNITY)
Admission: EM | Disposition: A | Discharge status: Home / Self Care | Payer: Self-pay | Source: Home / Self Care | Attending: Family Medicine

## 2021-12-04 DIAGNOSIS — F909 Attention-deficit hyperactivity disorder, unspecified type: Secondary | ICD-10-CM | POA: Diagnosis present

## 2021-12-04 DIAGNOSIS — E8881 Metabolic syndrome: Secondary | ICD-10-CM | POA: Diagnosis present

## 2021-12-04 DIAGNOSIS — K3184 Gastroparesis: Secondary | ICD-10-CM | POA: Diagnosis present

## 2021-12-04 DIAGNOSIS — F4322 Adjustment disorder with anxiety: Secondary | ICD-10-CM | POA: Diagnosis present

## 2021-12-04 DIAGNOSIS — R55 Syncope and collapse: Secondary | ICD-10-CM | POA: Diagnosis present

## 2021-12-04 DIAGNOSIS — R1310 Dysphagia, unspecified: Secondary | ICD-10-CM | POA: Diagnosis present

## 2021-12-04 DIAGNOSIS — E876 Hypokalemia: Secondary | ICD-10-CM | POA: Diagnosis present

## 2021-12-04 DIAGNOSIS — G894 Chronic pain syndrome: Secondary | ICD-10-CM | POA: Diagnosis present

## 2021-12-04 DIAGNOSIS — E785 Hyperlipidemia, unspecified: Secondary | ICD-10-CM | POA: Diagnosis present

## 2021-12-04 DIAGNOSIS — R112 Nausea with vomiting, unspecified: Secondary | ICD-10-CM | POA: Diagnosis not present

## 2021-12-04 DIAGNOSIS — Z20822 Contact with and (suspected) exposure to covid-19: Secondary | ICD-10-CM | POA: Diagnosis present

## 2021-12-04 DIAGNOSIS — E669 Obesity, unspecified: Secondary | ICD-10-CM | POA: Diagnosis present

## 2021-12-04 DIAGNOSIS — E1143 Type 2 diabetes mellitus with diabetic autonomic (poly)neuropathy: Secondary | ICD-10-CM | POA: Diagnosis present

## 2021-12-04 DIAGNOSIS — E039 Hypothyroidism, unspecified: Secondary | ICD-10-CM | POA: Diagnosis present

## 2021-12-04 DIAGNOSIS — R1084 Generalized abdominal pain: Secondary | ICD-10-CM | POA: Diagnosis not present

## 2021-12-04 DIAGNOSIS — K5909 Other constipation: Secondary | ICD-10-CM | POA: Diagnosis present

## 2021-12-04 DIAGNOSIS — D649 Anemia, unspecified: Secondary | ICD-10-CM | POA: Diagnosis not present

## 2021-12-04 DIAGNOSIS — Z6831 Body mass index (BMI) 31.0-31.9, adult: Secondary | ICD-10-CM | POA: Diagnosis not present

## 2021-12-04 DIAGNOSIS — K219 Gastro-esophageal reflux disease without esophagitis: Secondary | ICD-10-CM | POA: Diagnosis present

## 2021-12-04 DIAGNOSIS — K2970 Gastritis, unspecified, without bleeding: Secondary | ICD-10-CM | POA: Diagnosis present

## 2021-12-04 DIAGNOSIS — E860 Dehydration: Secondary | ICD-10-CM | POA: Diagnosis present

## 2021-12-04 DIAGNOSIS — I959 Hypotension, unspecified: Secondary | ICD-10-CM | POA: Diagnosis present

## 2021-12-04 DIAGNOSIS — K921 Melena: Secondary | ICD-10-CM | POA: Diagnosis present

## 2021-12-04 DIAGNOSIS — I10 Essential (primary) hypertension: Secondary | ICD-10-CM | POA: Diagnosis present

## 2021-12-04 DIAGNOSIS — D509 Iron deficiency anemia, unspecified: Secondary | ICD-10-CM | POA: Diagnosis present

## 2021-12-04 DIAGNOSIS — F1120 Opioid dependence, uncomplicated: Secondary | ICD-10-CM | POA: Diagnosis present

## 2021-12-04 DIAGNOSIS — W19XXXA Unspecified fall, initial encounter: Secondary | ICD-10-CM | POA: Diagnosis present

## 2021-12-04 HISTORY — PX: BIOPSY: SHX5522

## 2021-12-04 HISTORY — PX: ESOPHAGOGASTRODUODENOSCOPY (EGD) WITH PROPOFOL: SHX5813

## 2021-12-04 LAB — COMPREHENSIVE METABOLIC PANEL
ALT: 24 U/L (ref 0–44)
AST: 19 U/L (ref 15–41)
Albumin: 2.6 g/dL — ABNORMAL LOW (ref 3.5–5.0)
Alkaline Phosphatase: 74 U/L (ref 38–126)
Anion gap: 7 (ref 5–15)
BUN: 5 mg/dL — ABNORMAL LOW (ref 6–20)
CO2: 27 mmol/L (ref 22–32)
Calcium: 8.2 mg/dL — ABNORMAL LOW (ref 8.9–10.3)
Chloride: 108 mmol/L (ref 98–111)
Creatinine, Ser: 0.68 mg/dL (ref 0.44–1.00)
GFR, Estimated: >60 mL/min (ref >60)
Glucose, Bld: 101 mg/dL — ABNORMAL HIGH (ref 70–99)
Potassium: 2.9 mmol/L — ABNORMAL LOW (ref 3.5–5.1)
Sodium: 142 mmol/L (ref 135–145)
Total Bilirubin: 0.5 mg/dL (ref 0.3–1.2)
Total Protein: 4.7 g/dL — ABNORMAL LOW (ref 6.5–8.1)

## 2021-12-04 LAB — GASTROINTESTINAL PANEL BY PCR, STOOL (REPLACES STOOL CULTURE)

## 2021-12-04 LAB — CBC
HCT: 34.6 % — ABNORMAL LOW (ref 36.0–46.0)
Hemoglobin: 11 g/dL — ABNORMAL LOW (ref 12.0–15.0)
MCH: 29.6 pg (ref 26.0–34.0)
MCHC: 31.8 g/dL (ref 30.0–36.0)
MCV: 93.3 fL (ref 80.0–100.0)
Platelets: 200 10*3/uL (ref 150–400)
RBC: 3.71 MIL/uL — ABNORMAL LOW (ref 3.87–5.11)
RDW: 14.6 % (ref 11.5–15.5)
WBC: 4.6 10*3/uL (ref 4.0–10.5)
nRBC: 0 % (ref 0.0–0.2)

## 2021-12-04 LAB — GLUCOSE, CAPILLARY
Glucose-Capillary: 92 mg/dL (ref 70–99)
Glucose-Capillary: 91 mg/dL (ref 70–99)
Glucose-Capillary: 76 mg/dL (ref 70–99)
Glucose-Capillary: 97 mg/dL (ref 70–99)

## 2021-12-04 LAB — POTASSIUM: Potassium: 3.7 mmol/L (ref 3.5–5.1)

## 2021-12-04 LAB — MAGNESIUM: Magnesium: 2.1 mg/dL (ref 1.7–2.4)

## 2021-12-04 SURGERY — ESOPHAGOGASTRODUODENOSCOPY (EGD) WITH PROPOFOL
Anesthesia: General

## 2021-12-04 MED ORDER — LIDOCAINE HCL (CARDIAC) PF 100 MG/5ML IV SOSY
PREFILLED_SYRINGE | INTRAVENOUS | Status: DC | PRN
Start: 2021-12-04 — End: 2021-12-04
  Administered 2021-12-04: 60 mg via INTRAVENOUS

## 2021-12-04 MED ORDER — PROPOFOL 10 MG/ML IV BOLUS
INTRAVENOUS | Status: DC | PRN
  Administered 2021-12-04: 100 mg via INTRAVENOUS

## 2021-12-04 MED ORDER — LACTATED RINGERS IV SOLN
INTRAVENOUS | Status: DC | PRN
Start: 2021-12-04 — End: 2021-12-04

## 2021-12-04 MED ORDER — POTASSIUM CHLORIDE 10 MEQ/100ML IV SOLN
10.0000 meq | INTRAVENOUS | Status: AC
  Administered 2021-12-04 (×3): 10 meq via INTRAVENOUS
  Filled 2021-12-04 (×3): qty 100

## 2021-12-04 NOTE — Anesthesia Preprocedure Evaluation (Signed)
Anesthesia Evaluation  Patient identified by MRN, date of birth, ID band Patient awake    Reviewed: Allergy & Precautions, NPO status , Patient's Chart, lab work & pertinent test results  History of Anesthesia Complications (+) PONV, DIFFICULT AIRWAY, Family history of anesthesia reaction and history of anesthetic complications  Airway Mallampati: III  TM Distance: >3 FB Neck ROM: Full    Dental  (+) Dental Advisory Given, Teeth Intact   Pulmonary PE   Pulmonary exam normal breath sounds clear to auscultation       Cardiovascular Exercise Tolerance: Poor hypertension, Pt. on medications + angina Normal cardiovascular exam+ dysrhythmias Supra Ventricular Tachycardia  Rhythm:Regular Rate:Normal     Neuro/Psych  Headaches, PSYCHIATRIC DISORDERS Anxiety Depression TIA Neuromuscular disease CVA    GI/Hepatic GERD  Medicated and Controlled,EXP LAP, Small bowel ressection   Endo/Other  Hypothyroidism   Renal/GU Renal disease     Musculoskeletal  (+) Arthritis  (ACDF), Fibromyalgia -  Abdominal   Peds  Hematology  (+) anemia ,   Anesthesia Other Findings   Reproductive/Obstetrics                             Anesthesia Physical  Anesthesia Plan  ASA: 3  Anesthesia Plan: General   Post-op Pain Management:    Induction: Intravenous  PONV Risk Score and Plan: Propofol infusion  Airway Management Planned: Nasal Cannula and Natural Airway  Additional Equipment:   Intra-op Plan:   Post-operative Plan:   Informed Consent: I have reviewed the patients History and Physical, chart, labs and discussed the procedure including the risks, benefits and alternatives for the proposed anesthesia with the patient or authorized representative who has indicated his/her understanding and acceptance.     Dental advisory given  Plan Discussed with: CRNA and Surgeon  Anesthesia Plan Comments:          Anesthesia Quick Evaluation

## 2021-12-04 NOTE — Progress Notes (Signed)
PROGRESS NOTE   [name]  OEV:[id number] DOB: 12-08-1961 DOA: 12/01/2021 PCP: Fayrene Helper, MD   Chief Complaint  Patient presents with   Loss of Consciousness   Level of care: Telemetry  Brief Admission History:  60 y.o. female significant for h/o  fibromyalgia, ovarian cancer, left breast cancer, multiple abdominal surgeries  pertaining to a large rectus diastasis with abdominal hernia that began after an ovarian cyst removal that was converted to radical hysterectomy in 2012 at [facility]. This procedure was complicated by sepsis that required enterotomy and small bowel resection followed by chronic wound vac placement.  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  Pt presents to the ED with complaints of nausea, vomiting and concerns about passing out episode at home prior to arrival.  She has a long history of slow gastric emptying and has been hospitalized at [facility] as recently as June of this year for similar symptoms.   Assessment & Plan:   Principal Problem:   Hypokalemia Active Problems:   Iron deficiency anemia   Nausea and vomiting   Syncope   Abdominal pain   Essential hypertension   Hypomagnesemia   Dark stools   Anemia   Dysphagia  Hypokalemia and Hypomagnesemia - IV replacement given and repleted - follow up BMP in AM  Dehydration  - Pt remains dehydrated but improving - continue IV fluids reduce to 125 ml/hr   Intractable N/V/D - this is not new - follow stool studies - GI planning EGD later today - IV fluids ordered - advance to soft foods diet as tolerated per GI team.   Syncope - likely due to dehydration and electrolyte abnormalities - treating as above - xrays in ED without findings of acute fracture or injury  Essential hypertension   - BPs are soft, holding BP meds temporarily - IV fluids ordered for dehydration  History of DVT/PT - resumed home rivaroxaban  Chronic pain/opioid dependence - she is managed by a specialty pain management clinic  Hypothyroidism  - resume home levothyroxine   Anemia unspecified - GI consulted and planning EGD.  - recheck CBC in AM.    DVT prophylaxis:  rivaroxaban  Code Status: full  Family Communication: patient at bedside updated  Disposition: anticipating DC home in 1-2 days  Status is: Inpatient   The patient will require care spanning > 2 midnights and should be moved to inpatient because: IV fluids required, IV antiemetics   Consultants:  GI   Procedures:  EGD planned (tentative)   Antimicrobials:     Subjective: Pt still having nausea, diarrhea seems better this morning.    Objective: Vitals:   12/04/21 1459 12/04/21 1519 12/04/21 1530 12/04/21 1545  BP: 124/67 100/61 (!) 85/61 122/72  Pulse:  73 75 76  Resp: 13 (!) 23 20 16   Temp: 98.5 F (36.9 C) 98.4 F (36.9 C)    TempSrc: Oral     SpO2: 98% 94% 95% 94%  Weight:      Height:        Intake/Output Summary (Last 24 hours) at 12/04/2021 1645 Last data filed at 12/04/2021 1515 Gross per 24 hour  Intake 2279.32 ml  Output --  Net 2279.32 ml   Filed Weights   12/01/21 1619  Weight: 83.5 kg    Examination:  General exam:  awake, alert, cooperative, Appears calm and comfortable  Respiratory system: Clear to auscultation. Respiratory effort normal. Cardiovascular system: normal S1 & S2 heard. No JVD, murmurs, rubs, gallops or clicks. No pedal edema. Gastrointestinal system: Abdomen is nondistended, soft and nontender. No organomegaly or masses felt. Normal bowel sounds heard. Central nervous system: Alert and oriented. No focal neurological deficits. Extremities: Symmetric 5 x 5 power. Skin: No rashes, lesions or ulcers Psychiatry: Judgement and insight appear normal. Mood & affect appropriate.    Data Reviewed: I have personally reviewed following labs and imaging studies  CBC: Recent Labs  Lab 12/01/21 1650 12/03/21 0435 12/04/21 0519  WBC 8.8 4.6 4.6  HGB 11.8* 11.0* 11.0*  HCT 36.0 34.4* 34.6*  MCV 91.6 93.0 93.3  PLT 212 189 865    Basic Metabolic Panel: Recent Labs  Lab 12/01/21 1650 12/02/21 1509 12/03/21 0435 12/04/21 0519 12/04/21 1347  NA 136 138 141  143 142  --   K 2.7* 3.1* 2.7*  2.8* 2.9* 3.7  CL 99 105 106  106 108  --   CO2 31 24 28  29 27   --   GLUCOSE 125* 107* 91  93 101*  --   BUN 17 9 6  6  5*  --   CREATININE 0.91 0.66 0.66  0.64 0.68  --   CALCIUM 8.0* 7.6* 8.3*  8.4* 8.2*  --   MG 1.3*  --  1.6* 2.1  --   PHOS  --  2.6 3.8  --   --     GFR: Estimated Creatinine Clearance: 78.2 mL/min (by C-G formula based on SCr of 0.68 mg/dL).  Liver Function Tests: Recent Labs  Lab 12/02/21 1509 12/03/21 0435 12/04/21 0519  AST  --   --  19  ALT  --   --  24  ALKPHOS  --   --  74  BILITOT  --   --  0.5  PROT  --   --  4.7*  ALBUMIN 2.8* 2.7* 2.6*    CBG: Recent Labs  Lab 12/03/21 1643 12/03/21 2057 12/04/21 0717 12/04/21 1103 12/04/21 1620  GLUCAP 84 121* 92 91 76    Recent Results (from the past 240 hour(s))  Resp Panel by RT-PCR (Flu A&B, Covid) Nasopharyngeal Swab     Status: None   Collection Time: 12/01/21  9:33 PM   Specimen: Nasopharyngeal Swab; Nasopharyngeal(NP) swabs in vial transport medium  Result Value Ref Range Status   SARS Coronavirus 2 by RT PCR NEGATIVE NEGATIVE Final    Comment: (NOTE) SARS-CoV-2 target nucleic acids are NOT DETECTED.  The SARS-CoV-2 RNA is generally detectable in upper respiratory specimens during the acute phase of infection. The lowest concentration of SARS-CoV-2 viral copies this assay can detect is 138 copies/mL. A negative result does not preclude SARS-Cov-2 infection and should not be used as the sole basis for treatment or other patient management decisions. A negative  result may occur with  improper specimen collection/handling, submission of specimen other than nasopharyngeal swab, presence of viral mutation(s) within the areas targeted by this assay, and inadequate number of viral copies(<138 copies/mL). A negative result must be combined with clinical observations, patient history, and epidemiological information. The expected result is Negative.  Fact Sheet for Patients:  EntrepreneurPulse.com.au  Fact Sheet for Healthcare Providers:  IncredibleEmployment.be  This test is no t yet approved or cleared by the [place] FDA and  has been authorized for detection and/or diagnosis of SARS-CoV-2 by FDA under an Emergency  Use Authorization (EUA). This EUA will remain  in effect (meaning this test can be used) for the duration of the COVID-19 declaration under Section 564(b)(1) of the Act, 21 U.S.C.section 360bbb-3(b)(1), unless the authorization is terminated  or revoked sooner.       Influenza A by PCR NEGATIVE NEGATIVE Final   Influenza B by PCR NEGATIVE NEGATIVE Final    Comment: (NOTE) The Xpert Xpress SARS-CoV-2/FLU/RSV plus assay is intended as an aid in the diagnosis of influenza from Nasopharyngeal swab specimens and should not be used as a sole basis for treatment. Nasal washings and aspirates are unacceptable for Xpert Xpress SARS-CoV-2/FLU/RSV testing.  Fact Sheet for Patients: EntrepreneurPulse.com.au  Fact Sheet for Healthcare Providers: IncredibleEmployment.be  This test is not yet approved or cleared by the [place] FDA and has been authorized for detection and/or diagnosis of SARS-CoV-2 by FDA under an Emergency Use Authorization (EUA). This EUA will remain in effect (meaning this test can be used) for the duration of the COVID-19 declaration under Section 564(b)(1) of the Act, 21 U.S.C. section 360bbb-3(b)(1), unless the authorization is  terminated or revoked.  Performed at [facility], [address], [place], [place] [address]   Gastrointestinal Panel by PCR , Stool     Status: None   Collection Time: 12/03/21  4:28 PM   Specimen: Stool  Result Value Ref Range Status   Campylobacter species NOT DETECTED NOT DETECTED Final   Plesimonas shigelloides NOT DETECTED NOT DETECTED Final   Salmonella species NOT DETECTED NOT DETECTED Final   Yersinia enterocolitica NOT DETECTED NOT DETECTED Final   Vibrio species NOT DETECTED NOT DETECTED Final   Vibrio cholerae NOT DETECTED NOT DETECTED Final   Enteroaggregative E coli (EAEC) NOT DETECTED NOT DETECTED Final   Enteropathogenic E coli (EPEC) NOT DETECTED NOT DETECTED Final   Enterotoxigenic E coli (ETEC) NOT DETECTED NOT DETECTED Final   Shiga like toxin producing E coli (STEC) NOT DETECTED NOT DETECTED Final   Shigella/Enteroinvasive E coli (EIEC) NOT DETECTED NOT DETECTED Final   Cryptosporidium NOT DETECTED NOT DETECTED Final   Cyclospora cayetanensis NOT DETECTED NOT DETECTED Final   Entamoeba histolytica NOT DETECTED NOT DETECTED Final   Giardia lamblia NOT DETECTED NOT DETECTED Final   Adenovirus F40/41 NOT DETECTED NOT DETECTED Final   Astrovirus NOT DETECTED NOT DETECTED Final   Norovirus GI/GII NOT DETECTED NOT DETECTED Final   Rotavirus A NOT DETECTED NOT DETECTED Final   Sapovirus (I, II, IV, and V) NOT DETECTED NOT DETECTED Final    Comment: Performed at [facility], [address] [place], [place] [address]  C Difficile Quick Screen w PCR reflex     Status: None   Collection Time: 12/03/21  4:28 PM   Specimen: STOOL  Result Value Ref Range Status   C Diff antigen NEGATIVE NEGATIVE Final   C Diff toxin NEGATIVE NEGATIVE Final   C Diff interpretation No C. difficile detected.  Final    Comment: Performed at [facility], [address] [place], [place] [address]     Radiology Studies: No results found.  Scheduled Meds:  aspirin EC  81 mg  Oral Q breakfast   DULoxetine  60 mg Oral Daily   erythromycin  250 mg Oral TID AC   ezetimibe  10 mg Oral Daily   insulin aspart  0-5 Units Subcutaneous QHS   insulin aspart  0-6 Units Subcutaneous TID WC   levothyroxine  125 mcg Oral Q0600   magnesium oxide  400 mg Oral BID   multivitamin-lutein  1 capsule Oral QHS   pantoprazole  40 mg Oral BID   polyethylene glycol  17 g Oral Daily   pravastatin  40 mg Oral QPM   rifaximin  550 mg Oral TID   senna-docusate  2 tablet Oral BID   sodium chloride flush  3 mL Intravenous Q12H   sodium chloride flush  3 mL Intravenous Q12H   sucralfate  1 g Oral TID WC & HS   Continuous Infusions:  sodium chloride     sodium chloride 140 mL/hr at 12/03/21 1425    LOS: 0 days   Time spent: 35 mins   Walfred Bettendorf Wynetta Emery, MD How to contact the [facility] Attending or Consulting provider [place] or covering provider during after hours [place], for this patient?  Check the care team in [facility] and look for a) attending/consulting [facility] provider listed and b) the [facility] team listed Log into [email or web address] and use [facility] universal password to access. If you do not have the password, please contact the hospital operator. Locate the [facility] provider you are looking for under [facility] and page to a number that you can be directly reached. If you still have difficulty reaching the provider, please page the [facility] (Director on Call) for the Hospitalists listed on amion for assistance.  12/04/2021, 4:45 PM

## 2021-12-04 NOTE — Op Note (Signed)
[facility] Patient Name: [name] Procedure Date: 12/04/2021 2:42 PM MRN: [id number] Date of Birth: February 28, 1961 Attending MD: Elon Alas. Abbey Chatters DO CSN: [id number] Age: 60 Admit Type: Inpatient Procedure:                Upper GI endoscopy Indications:              Epigastric abdominal pain, Iron deficiency anemia,                            Nausea with vomiting Providers:                Elon Alas. Abbey Chatters, DO, Jessica Boudreaux, Tammy                            Vaught, RN Referring MD:              Medicines:                See the Anesthesia note for documentation of the                            administered medications Complications:            No immediate complications. Estimated Blood Loss:     Estimated blood loss was minimal. Procedure:                Pre-Anesthesia Assessment:                           - The anesthesia plan was to use monitored                            anesthesia care (MAC).                           After obtaining informed consent, the endoscope was                            passed under direct vision. Throughout the                            procedure, the patient's blood pressure, pulse, and                            oxygen saturations were monitored continuously. The                            [id number]-[id number] ([id number]) scope was introduced through the                            mouth, and advanced to the second part of duodenum.                            The upper GI endoscopy was accomplished without                            difficulty. The patient tolerated the procedure  well. Scope In: 3:12:23 PM Scope Out: 3:15:54 PM Total Procedure Duration: 0 hours 3 minutes 31 seconds  Findings:      There is no endoscopic evidence of bleeding, areas of erosion,       esophagitis, ulcerations or varices in the entire esophagus.      Patchy mild inflammation characterized by erythema was found in the       gastric body.  Biopsies were taken with a cold forceps for Helicobacter       pylori testing.      The duodenal bulb, first portion of the duodenum and second portion of       the duodenum were normal. Biopsies for histology were taken with a cold       forceps for evaluation of celiac disease. Impression:               - Gastritis. Biopsied.                           - Normal duodenal bulb, first portion of the                            duodenum and second portion of the duodenum.                            Biopsied. Moderate Sedation:      Per Anesthesia Care Recommendation:           - Return patient to hospital ward for ongoing care.                           - Soft diet.                           - Continue supportive care with antiemetics as                            needed. Will need outpatient follow-up with [facility]                            motility clinic. Continue twice daily PPI. Procedure Code(s):        --- Professional ---                           [phone], Esophagogastroduodenoscopy, flexible,                            transoral; with biopsy, single or multiple Diagnosis Code(s):        --- Professional ---                           K29.70, Gastritis, unspecified, without bleeding                           R10.13, Epigastric pain                           D50.9, Iron deficiency anemia, unspecified  R11.2, Nausea with vomiting, unspecified CPT copyright 2019 American Medical Association. All rights reserved. The codes documented in this report are preliminary and upon coder review may  be revised to meet current compliance requirements. Elon Alas. Abbey Chatters, DO [place] Abbey Chatters, DO 12/04/2021 3:27:32 PM This report has been signed electronically. Number of Addenda: 0

## 2021-12-04 NOTE — Interval H&P Note (Signed)
History and Physical Interval Note:  12/04/2021 2:46 PM  [name]  has presented today for surgery, with the diagnosis of Nausea/vomiting, abdominal pain.  The various methods of treatment have been discussed with the patient and family. After consideration of risks, benefits and other options for treatment, the patient has consented to  Procedure(s): ESOPHAGOGASTRODUODENOSCOPY (EGD) WITH PROPOFOL (N/A) as a surgical intervention.  [name] history has been reviewed, patient examined, no change in status, stable for surgery.  I have reviewed the patient's chart and labs.  Questions were answered to the patient's satisfaction.     [name]

## 2021-12-04 NOTE — Progress Notes (Signed)
Brief follow up note:  Patient states her diarrhea seems to have resolved. She has had no stools in greater than 12 hours.  Was able to tolerate a small amount of cereal last night, although continues with persistent nausea. Does better with liquids.  Reports intermittent melena over the past month but none yesterday.  Has had some vague dysphagia symptoms to pills, dry foods, bread and rice.  Had some improvement with last dilation.  Complains of body aches today.  Continues to have lower abdominal pain.  Today her magnesium is corrected at 2.1, potassium slightly improved to 2.9.  Hemoglobin stable at 11.  She has 3 runs of IV potassium ordered, completing first 1 now.  Rate had to be decreased due to patient discomfort.  Systolic blood pressures in the 90 range.  Abdominal exam with positive bowel sounds, slight distention in midline and to the right of midline which is a stable finding.  Moderate suprapubic tenderness.  UA unremarkable, C. difficile negative.  GI pathogen panel pending.  Discussed with anesthesia, will recheck potassium after runs completed and plan for EGD today if K is 3.0 or greater.   Laureen Ochs. Bernarda Caffey [facility] [phone] 12/8/202212:25 PM

## 2021-12-04 NOTE — Transfer of Care (Signed)
Immediate Anesthesia Transfer of Care Note  Patient: [name]  Procedure(s) Performed: ESOPHAGOGASTRODUODENOSCOPY (EGD) WITH PROPOFOL BIOPSY  Patient Location: PACU  Anesthesia Type:General  Level of Consciousness: drowsy  Airway & Oxygen Therapy: Patient Spontanous Breathing  Post-op Assessment: Report given to RN and Post -op Vital signs reviewed and stable  Post vital signs: Reviewed and stable  Last Vitals:  Vitals Value Taken Time  BP    Temp    Pulse 75   Resp 23   SpO2 94%     Last Pain:  Vitals:   12/04/21 1512  TempSrc:   PainSc: 7       Patients Stated Pain Goal: 0 (21/74/71 5953)  Complications: No notable events documented.

## 2021-12-04 NOTE — Anesthesia Postprocedure Evaluation (Signed)
Anesthesia Post Note  Patient: Flannery Cavallero  Procedure(s) Performed: ESOPHAGOGASTRODUODENOSCOPY (EGD) WITH PROPOFOL BIOPSY  Patient location during evaluation: PACU Anesthesia Type: General Level of consciousness: awake and alert and oriented Pain management: pain level controlled Vital Signs Assessment: post-procedure vital signs reviewed and stable Respiratory status: spontaneous breathing, nonlabored ventilation and respiratory function stable Cardiovascular status: blood pressure returned to baseline and stable Postop Assessment: no apparent nausea or vomiting Anesthetic complications: no   No notable events documented.   Last Vitals:  Vitals:   12/04/21 1530 12/04/21 1545  BP: (!) 85/61 122/72  Pulse: 75 76  Resp: 20 16  Temp:    SpO2: 95% 94%    Last Pain:  Vitals:   12/04/21 1545  TempSrc:   PainSc: 0-No pain                 Shaketta Rill C Patrici Minnis

## 2021-12-05 ENCOUNTER — Encounter (HOSPITAL_COMMUNITY): Payer: Self-pay | Admitting: Internal Medicine

## 2021-12-05 ENCOUNTER — Telehealth: Payer: Self-pay | Admitting: Gastroenterology

## 2021-12-05 LAB — CBC
HCT: 33.3 % — ABNORMAL LOW (ref 36.0–46.0)
Hemoglobin: 10.4 g/dL — ABNORMAL LOW (ref 12.0–15.0)
MCH: 29.8 pg (ref 26.0–34.0)
MCHC: 31.2 g/dL (ref 30.0–36.0)
MCV: 95.4 fL (ref 80.0–100.0)
Platelets: 183 10*3/uL (ref 150–400)
RBC: 3.49 MIL/uL — ABNORMAL LOW (ref 3.87–5.11)
RDW: 14.4 % (ref 11.5–15.5)
WBC: 4.7 10*3/uL (ref 4.0–10.5)
nRBC: 0 % (ref 0.0–0.2)

## 2021-12-05 LAB — BASIC METABOLIC PANEL
Anion gap: 7 (ref 5–15)
BUN: 5 mg/dL — ABNORMAL LOW (ref 6–20)
CO2: 24 mmol/L (ref 22–32)
Calcium: 7.7 mg/dL — ABNORMAL LOW (ref 8.9–10.3)
Chloride: 111 mmol/L (ref 98–111)
Creatinine, Ser: 0.65 mg/dL (ref 0.44–1.00)
GFR, Estimated: >60 mL/min (ref >60)
Glucose, Bld: 108 mg/dL — ABNORMAL HIGH (ref 70–99)
Potassium: 3.3 mmol/L — ABNORMAL LOW (ref 3.5–5.1)
Sodium: 142 mmol/L (ref 135–145)

## 2021-12-05 LAB — GLUCOSE, CAPILLARY
Glucose-Capillary: 112 mg/dL — ABNORMAL HIGH (ref 70–99)
Glucose-Capillary: 103 mg/dL — ABNORMAL HIGH (ref 70–99)

## 2021-12-05 MED ORDER — POTASSIUM CHLORIDE CRYS ER 10 MEQ PO TBCR: 10.0000 meq | EXTENDED_RELEASE_TABLET | Freq: Every day | ORAL | 0 refills | Status: DC

## 2021-12-05 MED ORDER — FLUTICASONE PROPIONATE 50 MCG/ACT NA SUSP: 2.0000 | Freq: Every day | NASAL | Status: DC | PRN

## 2021-12-05 MED ORDER — POTASSIUM CHLORIDE CRYS ER 20 MEQ PO TBCR
40.0000 meq | EXTENDED_RELEASE_TABLET | Freq: Once | ORAL | Status: AC
  Administered 2021-12-05: 40 meq via ORAL
  Filled 2021-12-05: qty 2

## 2021-12-05 MED ORDER — POTASSIUM CHLORIDE 20 MEQ PO PACK: 40.0000 meq | PACK | Freq: Once | ORAL | Status: DC

## 2021-12-05 NOTE — Discharge Summary (Signed)
Physician Discharge Summary  [name] IRC:[id number] DOB: 1961/02/06 DOA: 12/01/2021  PCP: Fayrene Helper, MD GI: Jenetta Downer   Admit date: 12/01/2021 Discharge date: 12/05/2021  Admitted From:  HOME  Disposition: HOME   Recommendations for Outpatient Follow-up:  Follow up with PCP in 1 weeks Follow up with GI in 2 weeks for referral to [facility]  Please obtain BMP in 1-2 weeks for potassium check  Please follow up on the following pending results: EGD biopsy results   Discharge Condition: STABLE   CODE STATUS: FULL DIET: soft foods diet plan   Brief Hospitalization Summary: Please see all hospital notes, images, labs for full details of the hospitalization. Brief Admission History:  60 y.o. female significant for h/o  fibromyalgia, ovarian cancer, left breast cancer, multiple abdominal surgeries  pertaining to a large rectus diastasis with abdominal hernia that began after an ovarian cyst removal that was converted to radical hysterectomy in 2012 at [facility]. This procedure was complicated by sepsis that required enterotomy and small bowel resection followed by chronic wound vac placement.  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  Pt presents to the ED with complaints of nausea, vomiting and concerns about passing out episode at home prior to arrival.  She has a long history of slow gastric emptying and has been hospitalized at [facility] as recently as June of this year for similar symptoms.    Assessment & Plan:   Principal Problem:   Hypokalemia Active Problems:   Iron deficiency anemia   Nausea and vomiting   Syncope   Abdominal pain   Essential hypertension   Hypomagnesemia   Dark stools   Anemia   Dysphagia   Hypokalemia and Hypomagnesemia - IV  replacement given and repleted - dc home with oral KCl 10 meq po daily #10  - recheck BMP with PCP in 1-2 weeks   Dehydration - TREATED - treated with IV fluids - tolerating diet now fluids and solids    Intractable N/V/D - resolved - this is not new - follow stool studies - GI did EGD 12/8 with findings of gastritis, biopsies taken - she will be referred by GI to [facility]  - advanced to soft foods diet and tolerated     Syncope - likely due to dehydration and electrolyte abnormalities - treated as above - xrays in ED without findings of acute fracture or injury   Essential hypertension  -not requiring BP meds at this time, follow up with PCP   History of DVT/PT - resumed home rivaroxaban   Chronic pain/opioid dependence - she is managed by a specialty pain management clinic  Hypothyroidism  - resumed home levothyroxine    Anemia unspecified - stable    DVT prophylaxis:  rivaroxaban  Code Status: full  Family Communication: patient at bedside updated  Disposition: HOME  Status is: Inpatient    Discharge Diagnoses:  Principal Problem:   Hypokalemia Active Problems:   Iron deficiency anemia   Nausea and vomiting   Syncope   Abdominal pain   Essential hypertension   Hypomagnesemia   Dark stools   Anemia   Dysphagia   Discharge Instructions:  Allergies as of 12/05/2021       Reactions   Nitrofurantoin Hives   Crestor [rosuvastatin Calcium] Other (See Comments)  Generalized cramps   Bisacodyl Other (See Comments)   Makes patient feel like she is having cramps Makes patient feel like she is having cramps   Clarithromycin Hives, Other (See Comments)   Other reaction(s): Other (See Comments) Cluster migraines Cluster migraines   Clarithromycin Other (See Comments)   Cluster migraines   Clindamycin Hcl Hives   Codeine Itching   Iron Sucrose Other (See Comments)   Flushing- required benadryl and solu medrol   Monosodium Glutamate Other  (See Comments)   Cluster migraines Cluster migraines   Scopolamine Hbr Other (See Comments)   Cluster migraines, impaired vision        Medication List     STOP taking these medications    ICaps Areds 2 Caps   metoprolol tartrate 25 MG tablet Commonly known as: LOPRESSOR   Motegrity 1 MG Tabs Generic drug: Prucalopride Succinate   predniSONE 10 MG tablet Commonly known as: DELTASONE   promethazine 25 MG tablet Commonly known as: PHENERGAN   rifaximin 550 MG Tabs tablet Commonly known as: XIFAXAN   vitamin C 500 MG tablet Commonly known as: ASCORBIC ACID   VITAMINS FOR HAIR PO       TAKE these medications    ALPRAZolam 1 MG tablet Commonly known as: XANAX Take 1 tablet (1 mg total) by mouth 2 (two) times daily.   aspirin 81 MG chewable tablet Chew 81 mg by mouth daily.   butalbital-acetaminophen-caffeine 50-325-40 MG tablet Commonly known as: FIORICET Take 1 tablet by mouth as directed.   conjugated estrogens 0.625 MG/GM vaginal cream Commonly known as: PREMARIN Place fingertip amount intravaginally 2 to 3 times per week What changed:  how much to take how to take this when to take this   cyanocobalamin 1000 MCG/ML injection Commonly known as: (VITAMIN B-12) Inject 1 mL (1,000 mcg total) into the muscle every 30 (thirty) days.   cyclobenzaprine 10 MG tablet Commonly known as: FLEXERIL TAKE 1 TABLET BY MOUTH TWICE DAILY AS NEEDED FOR MUSCLE SPASMS   cycloSPORINE 0.05 % ophthalmic emulsion Commonly known as: RESTASIS Place 1 drop into both eyes 2 (two) times daily as needed (dry eyes).   DULoxetine 60 MG capsule Commonly known as: CYMBALTA Take 60 mg by mouth daily.   Erythromycin 250 MG Tbec Take 250 mg by mouth 3 (three) times daily before meals.   ezetimibe 10 MG tablet Commonly known as: Zetia Take 1 tablet (10 mg total) by mouth daily.   fluticasone 50 MCG/ACT nasal spray Commonly known as: FLONASE Place 2 sprays into both  nostrils daily as needed for allergies.   HYDROmorphone 4 MG tablet Commonly known as: DILAUDID Take 4 mg by mouth in the morning, at noon, and at bedtime.   levothyroxine 125 MCG tablet Commonly known as: SYNTHROID TAKE 1 TABLET(125 MCG) BY MOUTH DAILY BEFORE BREAKFAST   magnesium oxide 400 (240 Mg) MG tablet Commonly known as: MAG-OX Take 1 tablet (400 mg total) by mouth 2 (two) times daily. Please make overdue appt with Dr. Caryl Comes before anymore refills. Thank you 1st attempt What changed: when to take this   meclizine 25 MG tablet Commonly known as: ANTIVERT Take 1 tablet (25 mg total) by mouth 3 (three) times daily as needed for dizziness.   omeprazole 40 MG capsule Commonly known as: PRILOSEC Take 1 capsule (40 mg total) by mouth in the morning and at bedtime.   potassium chloride 10 MEQ tablet Commonly known as: KLOR-CON M Take 1 tablet (10 mEq total) by  mouth daily.   pravastatin 40 MG tablet Commonly known as: PRAVACHOL TAKE 1 TABLET(40 MG) BY MOUTH DAILY What changed:  how much to take how to take this when to take this   senna-docusate 8.6-50 MG tablet Commonly known as: Senokot-S Take 2 tablets by mouth 2 (two) times daily. constipation   sucralfate 1 g tablet Commonly known as: CARAFATE Take 1 tablet (1 g total) by mouth 4 (four) times daily -  with meals and at bedtime.   Xarelto 20 MG Tabs tablet Generic drug: rivaroxaban TAKE 1 TABLET(20 MG) BY MOUTH DAILY What changed: See the new instructions.        Follow-up Information     Fayrene Helper, MD. Schedule an appointment as soon as possible for a visit in 1 week(s).   Specialty: Family Medicine Why: Hospital Follow Up Contact information: [address] [place] [address] [phone]         Deboraha Sprang, MD .   Specialty: Cardiology Contact information: [phone] [address] [place] [address] [phone]         Montez Morita, Quillian Quince,  MD. Schedule an appointment as soon as possible for a visit in 2 week(s).   Specialty: Gastroenterology Why: Hospital Follow Up Contact information: [address] [place] [address] [phone]                Allergies  Allergen Reactions   Nitrofurantoin Hives   Crestor [Rosuvastatin Calcium] Other (See Comments)    Generalized cramps   Bisacodyl Other (See Comments)    Makes patient feel like she is having cramps Makes patient feel like she is having cramps   Clarithromycin Hives and Other (See Comments)    Other reaction(s): Other (See Comments) Cluster migraines  Cluster migraines   Clarithromycin Other (See Comments)    Cluster migraines    Clindamycin Hcl Hives   Codeine Itching   Iron Sucrose Other (See Comments)    Flushing- required benadryl and solu medrol   Monosodium Glutamate Other (See Comments)    Cluster migraines  Cluster migraines   Scopolamine Hbr Other (See Comments)    Cluster migraines, impaired vision   Allergies as of 12/05/2021       Reactions   Nitrofurantoin Hives   Crestor [rosuvastatin Calcium] Other (See Comments)   Generalized cramps   Bisacodyl Other (See Comments)   Makes patient feel like she is having cramps Makes patient feel like she is having cramps   Clarithromycin Hives, Other (See Comments)   Other reaction(s): Other (See Comments) Cluster migraines Cluster migraines   Clarithromycin Other (See Comments)   Cluster migraines   Clindamycin Hcl Hives   Codeine Itching   Iron Sucrose Other (See Comments)   Flushing- required benadryl and solu medrol   Monosodium Glutamate Other (See Comments)   Cluster migraines Cluster migraines   Scopolamine Hbr Other (See Comments)   Cluster migraines, impaired vision        Medication List     STOP taking these medications    ICaps Areds 2 Caps   metoprolol tartrate 25 MG tablet Commonly known as: LOPRESSOR   Motegrity 1 MG Tabs Generic drug:  Prucalopride Succinate   predniSONE 10 MG tablet Commonly known as: DELTASONE   promethazine 25 MG tablet Commonly known as: PHENERGAN   rifaximin 550 MG Tabs tablet Commonly known as: XIFAXAN   vitamin C 500 MG tablet Commonly known as: ASCORBIC ACID  VITAMINS FOR HAIR PO       TAKE these medications    ALPRAZolam 1 MG tablet Commonly known as: XANAX Take 1 tablet (1 mg total) by mouth 2 (two) times daily.   aspirin 81 MG chewable tablet Chew 81 mg by mouth daily.   butalbital-acetaminophen-caffeine 50-325-40 MG tablet Commonly known as: FIORICET Take 1 tablet by mouth as directed.   conjugated estrogens 0.625 MG/GM vaginal cream Commonly known as: PREMARIN Place fingertip amount intravaginally 2 to 3 times per week What changed:  how much to take how to take this when to take this   cyanocobalamin 1000 MCG/ML injection Commonly known as: (VITAMIN B-12) Inject 1 mL (1,000 mcg total) into the muscle every 30 (thirty) days.   cyclobenzaprine 10 MG tablet Commonly known as: FLEXERIL TAKE 1 TABLET BY MOUTH TWICE DAILY AS NEEDED FOR MUSCLE SPASMS   cycloSPORINE 0.05 % ophthalmic emulsion Commonly known as: RESTASIS Place 1 drop into both eyes 2 (two) times daily as needed (dry eyes).   DULoxetine 60 MG capsule Commonly known as: CYMBALTA Take 60 mg by mouth daily.   Erythromycin 250 MG Tbec Take 250 mg by mouth 3 (three) times daily before meals.   ezetimibe 10 MG tablet Commonly known as: Zetia Take 1 tablet (10 mg total) by mouth daily.   fluticasone 50 MCG/ACT nasal spray Commonly known as: FLONASE Place 2 sprays into both nostrils daily as needed for allergies.   HYDROmorphone 4 MG tablet Commonly known as: DILAUDID Take 4 mg by mouth in the morning, at noon, and at bedtime.   levothyroxine 125 MCG tablet Commonly known as: SYNTHROID TAKE 1 TABLET(125 MCG) BY MOUTH DAILY BEFORE BREAKFAST   magnesium oxide 400 (240 Mg) MG tablet Commonly  known as: MAG-OX Take 1 tablet (400 mg total) by mouth 2 (two) times daily. Please make overdue appt with Dr. Caryl Comes before anymore refills. Thank you 1st attempt What changed: when to take this   meclizine 25 MG tablet Commonly known as: ANTIVERT Take 1 tablet (25 mg total) by mouth 3 (three) times daily as needed for dizziness.   omeprazole 40 MG capsule Commonly known as: PRILOSEC Take 1 capsule (40 mg total) by mouth in the morning and at bedtime.   potassium chloride 10 MEQ tablet Commonly known as: KLOR-CON M Take 1 tablet (10 mEq total) by mouth daily.   pravastatin 40 MG tablet Commonly known as: PRAVACHOL TAKE 1 TABLET(40 MG) BY MOUTH DAILY What changed:  how much to take how to take this when to take this   senna-docusate 8.6-50 MG tablet Commonly known as: Senokot-S Take 2 tablets by mouth 2 (two) times daily. constipation   sucralfate 1 g tablet Commonly known as: CARAFATE Take 1 tablet (1 g total) by mouth 4 (four) times daily -  with meals and at bedtime.   Xarelto 20 MG Tabs tablet Generic drug: rivaroxaban TAKE 1 TABLET(20 MG) BY MOUTH DAILY What changed: See the new instructions.        Procedures/Studies: DG Ribs Unilateral W/Chest Right  Result Date: 12/01/2021 CLINICAL DATA:  Recent syncopal episode with fall and right chest pain, initial encounter EXAM: RIGHT RIBS AND CHEST - 3+ VIEW COMPARISON:  None. FINDINGS: Cardiac shadow is within normal limits. Lungs are well aerated bilaterally. Postsurgical changes in the cervical spine are seen. No rib abnormality noted. IMPRESSION: No acute abnormality seen. Electronically Signed   By: Inez Catalina M.D.   On: 12/01/2021 22:22   DG Thoracic  Spine 2 View  Result Date: 12/01/2021 CLINICAL DATA:  Fall. EXAM: THORACIC SPINE 2 VIEWS COMPARISON:  None. FINDINGS: There is no evidence of thoracic spine fracture. Alignment is normal. No other significant bone abnormalities are identified. C4-C5 anterior fusion  plate is present. IMPRESSION: Negative. Electronically Signed   By: Ronney Asters M.D.   On: 12/01/2021 22:21   DG Abd 1 View  Result Date: 11/15/2021 CLINICAL DATA:  Small bowel study done yesterday.  Pain. EXAM: ABDOMEN - 1 VIEW COMPARISON:  November 14, 2021 FINDINGS: The contrast has exited the small bowel and is now seen throughout the length of the colon reaching the level the rectum. Air-filled prominent loops of bowel remain. IMPRESSION: The contrast now traverses the colon to the level the rectum. Air-filled prominent loops of primarily colon remain. Electronically Signed   By: Dorise Bullion III M.D.   On: 11/15/2021 19:12   CT ABDOMEN PELVIS W CONTRAST  Result Date: 11/11/2021 CLINICAL DATA:  Nausea, vomiting, dehydration. History of small-bowel obstruction and prior bowel resections. EXAM: CT ABDOMEN AND PELVIS WITH CONTRAST TECHNIQUE: Multidetector CT imaging of the abdomen and pelvis was performed using the standard protocol following bolus administration of intravenous contrast. CONTRAST:  125mL OMNIPAQUE IOHEXOL 300 MG/ML  SOLN COMPARISON:  06/12/2019 FINDINGS: Lower chest: Included lung bases are clear. Heart size is normal. Right breast prosthesis. Hepatobiliary: No focal liver abnormality is seen. Status post cholecystectomy. Similar degree of postoperative intra and extrahepatic biliary dilatation. Pancreas: Unremarkable. No pancreatic ductal dilatation or surrounding inflammatory changes. Spleen: Normal in size without focal abnormality. Adrenals/Urinary Tract: Unremarkable adrenal glands. Kidneys enhance symmetrically without focal lesion, stone, or hydronephrosis. Ureters are nondilated. Urinary bladder appears unremarkable for the degree of distension. Stomach/Bowel: Stomach within normal limits. Redemonstrated postoperative changes related to prior large and small bowel resections. No dilated loops of small bowel. Mildly dilated colon within the right abdomen with air-fluid  levels. Mucosal hyperenhancement within the rectum without focal thickening. Vascular/Lymphatic: No significant vascular findings are present. No enlarged abdominal or pelvic lymph nodes. Reproductive: Status post hysterectomy. No adnexal masses. Other: Large broad-based ventral midline abdominal wall hernia with postsurgical changes along the abdominal wall. No ascites. No pneumoperitoneum. Musculoskeletal: No acute or significant osseous findings. IMPRESSION: 1. Postsurgical changes to the bowel. Mildly dilated colon within the right abdomen with air-fluid levels suggestive of a diarrheal illness. Partial or developing bowel obstruction is not entirely excluded, particularly given the degree of postsurgical changes along the anterior abdominal wall. Close clinical follow-up recommended. 2. Mucosal hyperenhancement within the rectum without focal thickening, which may represent proctitis. Electronically Signed   By: Davina Poke D.O.   On: 11/11/2021 16:12   DG ABD ACUTE 2+V W 1V CHEST  Result Date: 11/12/2021 CLINICAL DATA:  Abdominal pain, weakness, chest pain EXAM: DG ABDOMEN ACUTE WITH 1 VIEW CHEST COMPARISON:  CT 11/11/2021, chest x-ray 04/13/2016 FINDINGS: Similar appearance of mildly dilated colon, predominantly in the right hemiabdomen. There is no evidence of free intraperitoneal air. Numerous surgical clips in the low abdomen and left upper quadrant. No radiopaque calculi or other significant radiographic abnormality is seen. Heart size and mediastinal contours are within normal limits. Chronic scarring in the left mid lung. Insert clear IMPRESSION: 1. Similar appearance of mildly dilated colon, predominantly in the right hemiabdomen. 2. No acute cardiopulmonary disease. Electronically Signed   By: Davina Poke D.O.   On: 11/12/2021 13:58   ECHOCARDIOGRAM COMPLETE  Result Date: 11/12/2021    ECHOCARDIOGRAM REPORT  Patient Name:   ALEYNAH ROCCHIO Date of Exam: 11/12/2021 Medical  Rec #:  [id number]             Height:       64.0 in Accession #:    [id number]            Weight:       188.0 lb Date of Birth:  23-Aug-1961             BSA:          1.906 m Patient Age:    31 years              BP:           90/64 mmHg Patient Gender: F                     HR:           73 bpm. Exam Location:  [facility] Procedure: 2D Echo, Cardiac Doppler and Color Doppler Indications:    Syncope  History:        Patient has prior history of Echocardiogram examinations, most                 recent 09/17/2020. Signs/Symptoms:Chest Pain and Syncope; Risk                 Factors:Hypertension and Dyslipidemia.  Sonographer:    Wenda Low Referring Phys: [phone] DAVID TAT  Sonographer Comments: Suboptimal apical window and patient is morbidly obese. IMPRESSIONS  1. Left ventricular ejection fraction, by estimation, is 60 to 65%. The left ventricle has normal function. The left ventricle has no regional wall motion abnormalities. Left ventricular diastolic parameters were normal.  2. Right ventricular systolic function is normal. The right ventricular size is normal. There is normal pulmonary artery systolic pressure. The estimated right ventricular systolic pressure is 51.7 mmHg.  3. The mitral valve is grossly normal. Trivial mitral valve regurgitation.  4. The aortic valve is tricuspid. Aortic valve regurgitation is not visualized. Aortic valve mean gradient measures 3.0 mmHg. Comparison(s): No significant change from prior study. Prior images reviewed side by side. FINDINGS  Left Ventricle: Left ventricular ejection fraction, by estimation, is 60 to 65%. The left ventricle has normal function. The left ventricle has no regional wall motion abnormalities. The left ventricular internal cavity size was normal in size. There is  no left ventricular hypertrophy. Left ventricular diastolic parameters were normal. Right Ventricle: The right ventricular size is normal. No increase in right ventricular wall thickness.  Right ventricular systolic function is normal. There is normal pulmonary artery systolic pressure. The tricuspid regurgitant velocity is 2.16 m/s, and  with an assumed right atrial pressure of 8 mmHg, the estimated right ventricular systolic pressure is 00.1 mmHg. Left Atrium: Left atrial size was normal in size. Right Atrium: Right atrial size was normal in size. Pericardium: There is no evidence of pericardial effusion. Presence of epicardial fat layer. Mitral Valve: The mitral valve is grossly normal. Trivial mitral valve regurgitation. MV peak gradient, 2.4 mmHg. The mean mitral valve gradient is 1.0 mmHg. Tricuspid Valve: The tricuspid valve is grossly normal. Tricuspid valve regurgitation is trivial. Aortic Valve: The aortic valve is tricuspid. There is mild aortic valve annular calcification. Aortic valve regurgitation is not visualized. Aortic valve mean gradient measures 3.0 mmHg. Aortic valve peak gradient measures 5.7 mmHg. Aortic valve area, by  VTI measures 2.74 cm. Pulmonic Valve: The pulmonic valve was grossly normal. Pulmonic  valve regurgitation is trivial. Aorta: The aortic root is normal in size and structure. IAS/Shunts: No atrial level shunt detected by color flow Doppler.  LEFT VENTRICLE PLAX 2D LVIDd:         4.90 cm   Diastology LVIDs:         3.15 cm   LV e' medial:    7.18 cm/s LV PW:         0.90 cm   LV E/e' medial:  9.0 LV IVS:        1.00 cm   LV e' lateral:   8.38 cm/s LVOT diam:     2.00 cm   LV E/e' lateral: 7.7 LV SV:         55 LV SV Index:   29 LVOT Area:     3.14 cm  LEFT ATRIUM             Index        RIGHT ATRIUM          Index LA diam:        3.60 cm 1.89 cm/m   RA Area:     9.95 cm LA Vol (A2C):   30.2 ml 15.85 ml/m  RA Volume:   18.30 ml 9.60 ml/m LA Vol (A4C):   22.1 ml 11.60 ml/m LA Biplane Vol: 26.6 ml 13.96 ml/m  AORTIC VALVE                    PULMONIC VALVE AV Area (Vmax):    2.21 cm     PV Vmax:       0.81 m/s AV Area (Vmean):   2.45 cm     PV Peak grad:   2.6 mmHg AV Area (VTI):     2.74 cm AV Vmax:           119.00 cm/s AV Vmean:          74.800 cm/s AV VTI:            0.202 m AV Peak Grad:      5.7 mmHg AV Mean Grad:      3.0 mmHg LVOT Vmax:         83.60 cm/s LVOT Vmean:        58.400 cm/s LVOT VTI:          0.176 m LVOT/AV VTI ratio: 0.87  AORTA Ao Root diam: 2.90 cm MITRAL VALVE               TRICUSPID VALVE MV Area (PHT): 4.08 cm    TR Peak grad:   18.7 mmHg MV Area VTI:   2.64 cm    TR Vmax:        216.00 cm/s MV Peak grad:  2.4 mmHg MV Mean grad:  1.0 mmHg    SHUNTS MV Vmax:       0.78 m/s    Systemic VTI:  0.18 m MV Vmean:      48.9 cm/s   Systemic Diam: 2.00 cm MV Decel Time: 186 msec MV E velocity: 64.70 cm/s MV A velocity: 58.70 cm/s MV E/A ratio:  1.10 Rozann Lesches MD Electronically signed by Rozann Lesches MD Signature Date/Time: 11/12/2021/12:33:54 PM    Final    DG HIP UNILAT WITH PELVIS 2-3 VIEWS RIGHT  Result Date: 11/12/2021 CLINICAL DATA:  Fall, right hip pain EXAM: DG HIP (WITH OR WITHOUT PELVIS) 2-3V RIGHT COMPARISON:  CT 11/11/2021 FINDINGS: Subtle linear lucency within the right inferior pubic  ramus, indeterminate for fracture. Osseous structures are otherwise intact. Hip joint intact without dislocation. Right hip joint space is maintained. There is soft tissue swelling along the lateral aspect of the hip at the level of the greater trochanter. IMPRESSION: 1. Subtle linear lucency within the right inferior pubic ramus, indeterminate for fracture. No fracture was present at this location on CT performed 1 day ago. 2. Soft tissue swelling along the lateral aspect of the hip. Electronically Signed   By: Davina Poke D.O.   On: 11/12/2021 12:37   DG SMALL BOWEL W DOUBLE CM (HD)  Result Date: 11/14/2021 CLINICAL DATA:  Abdominal pain. History of colon resection. Vomiting. Rule out bowel obstruction. EXAM: SMALL BOWEL SERIES COMPARISON:  CT abdomen pelvis 11/11/2021 TECHNIQUE: Following ingestion of thin barium, serial small  bowel images were obtained FLUOROSCOPY TIME:  Fluoroscopy Time:  0 Radiation Exposure Index (if provided by the fluoroscopic device): Number of Acquired Spot Images: 0 FINDINGS: Preliminary KUB demonstrates dilated bowel loops in the right abdomen and the central abdomen. These appear to be dilated small bowel loops. There are numerous surgical staple lines and clips in the abdomen. Review of the CT reveals apparent colectomy in the proximal sigmoid region. Surgical clips in the gallbladder fossa. Patient ingested barium and serial images were obtained to 180 minutes. Additional imaging recommended as barium has not reached the rectosigmoid at this time. Stomach grossly normal. Stomach empties into the duodenum. A small diverticulum in the third portion the duodenum. The jejunum fills with contrast and is not significantly dilated. On the delayed image at 180 minutes, there is barium in the right upper quadrant which appears to be mildly dilated ileum. No bowel wall edema or mass. IMPRESSION: 1. History of colectomy at the level the proximal sigmoid colon. 2. Barium is slow to transit through the small bowel. Barium filled loops in the right upper quadrant likely dilated ileum related to small-bowel obstruction. This dictation includes images out to 180 minutes. Additional KUB has been requested in 3 hours. Electronically Signed   By: Franchot Gallo M.D.   On: 11/14/2021 16:09   CT Maxillofacial Wo Contrast  Result Date: 11/11/2021 CLINICAL DATA:  Right-sided facial swelling after fall this morning. EXAM: CT MAXILLOFACIAL WITHOUT CONTRAST TECHNIQUE: Multidetector CT imaging of the maxillofacial structures was performed. Multiplanar CT image reconstructions were also generated. COMPARISON:  CT head dated April 13, 2016. CT maxillofacial dated August 11, 2009. FINDINGS: Osseous: No fracture or mandibular dislocation. No destructive process. Orbits: Negative. No traumatic or inflammatory finding. Sinuses: Clear.  Soft tissues: Mild right facial soft tissue swelling. Limited intracranial: No significant or unexpected finding. IMPRESSION: 1. No acute maxillofacial fracture. 2. Mild right facial soft tissue swelling. Electronically Signed   By: Titus Dubin M.D.   On: 11/11/2021 16:08     Subjective: Diarrhea resolved, tolerating diet well, would like to go home, will see Dr. Jenetta Downer for referral to [facility] motility clinic.   Discharge Exam: Vitals:   12/04/21 2242 12/05/21 0512  BP: (!) 102/50 (!) 84/49  Pulse:  80  Resp:  18  Temp:  97.9 F (36.6 C)  SpO2:  94%   Vitals:   12/04/21 1545 12/04/21 2234 12/04/21 2242 12/05/21 0512  BP: 122/72 (!) 84/53 (!) 102/50 (!) 84/49  Pulse: 76 78  80  Resp: 16 18  18   Temp:  98.3 F (36.8 C)  97.9 F (36.6 C)  TempSrc:      SpO2: 94% 93%  94%  Weight:  Height:        General: Pt is alert, awake, not in acute distress Cardiovascular: RRR, S1/S2 +, no rubs, no gallops Respiratory: CTA bilaterally, no wheezing, no rhonchi Abdominal: Soft, NT, ND, bowel sounds + Extremities: no edema, no cyanosis   The results of significant diagnostics from this hospitalization (including imaging, microbiology, ancillary and laboratory) are listed below for reference.     Microbiology: Recent Results (from the past 240 hour(s))  Resp Panel by RT-PCR (Flu A&B, Covid) Nasopharyngeal Swab     Status: None   Collection Time: 12/01/21  9:33 PM   Specimen: Nasopharyngeal Swab; Nasopharyngeal(NP) swabs in vial transport medium  Result Value Ref Range Status   SARS Coronavirus 2 by RT PCR NEGATIVE NEGATIVE Final    Comment: (NOTE) SARS-CoV-2 target nucleic acids are NOT DETECTED.  The SARS-CoV-2 RNA is generally detectable in upper respiratory specimens during the acute phase of infection. The lowest concentration of SARS-CoV-2 viral copies this assay can detect is 138 copies/mL. A negative result does not preclude SARS-Cov-2 infection and should not be used  as the sole basis for treatment or other patient management decisions. A negative result may occur with  improper specimen collection/handling, submission of specimen other than nasopharyngeal swab, presence of viral mutation(s) within the areas targeted by this assay, and inadequate number of viral copies(<138 copies/mL). A negative result must be combined with clinical observations, patient history, and epidemiological information. The expected result is Negative.  Fact Sheet for Patients:  EntrepreneurPulse.com.au  Fact Sheet for Healthcare Providers:  IncredibleEmployment.be  This test is no t yet approved or cleared by the [place] FDA and  has been authorized for detection and/or diagnosis of SARS-CoV-2 by FDA under an Emergency Use Authorization (EUA). This EUA will remain  in effect (meaning this test can be used) for the duration of the COVID-19 declaration under Section 564(b)(1) of the Act, 21 U.S.C.section 360bbb-3(b)(1), unless the authorization is terminated  or revoked sooner.       Influenza A by PCR NEGATIVE NEGATIVE Final   Influenza B by PCR NEGATIVE NEGATIVE Final    Comment: (NOTE) The Xpert Xpress SARS-CoV-2/FLU/RSV plus assay is intended as an aid in the diagnosis of influenza from Nasopharyngeal swab specimens and should not be used as a sole basis for treatment. Nasal washings and aspirates are unacceptable for Xpert Xpress SARS-CoV-2/FLU/RSV testing.  Fact Sheet for Patients: EntrepreneurPulse.com.au  Fact Sheet for Healthcare Providers: IncredibleEmployment.be  This test is not yet approved or cleared by the [place] FDA and has been authorized for detection and/or diagnosis of SARS-CoV-2 by FDA under an Emergency Use Authorization (EUA). This EUA will remain in effect (meaning this test can be used) for the duration of the COVID-19 declaration under Section 564(b)(1)  of the Act, 21 U.S.C. section 360bbb-3(b)(1), unless the authorization is terminated or revoked.  Performed at [facility], [address] [place], [place] [address]   Gastrointestinal Panel by PCR , Stool     Status: None   Collection Time: 12/03/21  4:28 PM   Specimen: Stool  Result Value Ref Range Status   Campylobacter species NOT DETECTED NOT DETECTED Final   Plesimonas shigelloides NOT DETECTED NOT DETECTED Final   Salmonella species NOT DETECTED NOT DETECTED Final   Yersinia enterocolitica NOT DETECTED NOT DETECTED Final   Vibrio species NOT DETECTED NOT DETECTED Final   Vibrio cholerae NOT DETECTED NOT DETECTED Final   Enteroaggregative E coli (EAEC) NOT DETECTED NOT DETECTED Final  Enteropathogenic E coli (EPEC) NOT DETECTED NOT DETECTED Final   Enterotoxigenic E coli (ETEC) NOT DETECTED NOT DETECTED Final   Shiga like toxin producing E coli (STEC) NOT DETECTED NOT DETECTED Final   Shigella/Enteroinvasive E coli (EIEC) NOT DETECTED NOT DETECTED Final   Cryptosporidium NOT DETECTED NOT DETECTED Final   Cyclospora cayetanensis NOT DETECTED NOT DETECTED Final   Entamoeba histolytica NOT DETECTED NOT DETECTED Final   Giardia lamblia NOT DETECTED NOT DETECTED Final   Adenovirus F40/41 NOT DETECTED NOT DETECTED Final   Astrovirus NOT DETECTED NOT DETECTED Final   Norovirus GI/GII NOT DETECTED NOT DETECTED Final   Rotavirus A NOT DETECTED NOT DETECTED Final   Sapovirus (I, II, IV, and V) NOT DETECTED NOT DETECTED Final    Comment: Performed at [facility], [address] [place], [place] [address]  C Difficile Quick Screen w PCR reflex     Status: None   Collection Time: 12/03/21  4:28 PM   Specimen: STOOL  Result Value Ref Range Status   C Diff antigen NEGATIVE NEGATIVE Final   C Diff toxin NEGATIVE NEGATIVE Final   C Diff interpretation No C. difficile detected.  Final    Comment: Performed at [facility], [address] [place], [place] [address]      Labs: BNP (last 3 results) No results for input(s): BNP in the last 8760 hours. Basic Metabolic Panel: Recent Labs  Lab 12/01/21 1650 12/02/21 1509 12/03/21 0435 12/04/21 0519 12/04/21 1347 12/05/21 0432  NA 136 138 141  143 142  --  142  K 2.7* 3.1* 2.7*  2.8* 2.9* 3.7 3.3*  CL 99 105 106  106 108  --  111  CO2 31 24 28  29 27   --  24  GLUCOSE 125* 107* 91  93 101*  --  108*  BUN 17 9 6  6  5*  --  5*  CREATININE 0.91 0.66 0.66  0.64 0.68  --  0.65  CALCIUM 8.0* 7.6* 8.3*  8.4* 8.2*  --  7.7*  MG 1.3*  --  1.6* 2.1  --   --   PHOS  --  2.6 3.8  --   --   --    Liver Function Tests: Recent Labs  Lab 12/02/21 1509 12/03/21 0435 12/04/21 0519  AST  --   --  19  ALT  --   --  24  ALKPHOS  --   --  74  BILITOT  --   --  0.5  PROT  --   --  4.7*  ALBUMIN 2.8* 2.7* 2.6*   No results for input(s): LIPASE, AMYLASE in the last 168 hours. No results for input(s): AMMONIA in the last 168 hours. CBC: Recent Labs  Lab 12/01/21 1650 12/03/21 0435 12/04/21 0519 12/05/21 0432  WBC 8.8 4.6 4.6 4.7  HGB 11.8* 11.0* 11.0* 10.4*  HCT 36.0 34.4* 34.6* 33.3*  MCV 91.6 93.0 93.3 95.4  PLT 212 189 200 183   Cardiac Enzymes: No results for input(s): CKTOTAL, CKMB, CKMBINDEX, TROPONINI in the last 168 hours. BNP: Invalid input(s): POCBNP CBG: Recent Labs  Lab 12/04/21 1103 12/04/21 1620 12/04/21 2235 12/05/21 0748 12/05/21 1116  GLUCAP 91 76 97 112* 103*   D-Dimer No results for input(s): DDIMER in the last 72 hours. Hgb A1c No results for input(s): HGBA1C in the last 72 hours. Lipid Profile No results for input(s): CHOL, HDL, LDLCALC, TRIG, CHOLHDL, LDLDIRECT in the last 72 hours. Thyroid function studies  No results for input(s): TSH, T4TOTAL, T3FREE, THYROIDAB in the last 72 hours.  Invalid input(s): FREET3 Anemia work up No results for input(s): VITAMINB12, FOLATE, FERRITIN, TIBC, IRON, RETICCTPCT in the last 72 hours. Urinalysis    Component Value  Date/Time   COLORURINE YELLOW 12/01/2021 2010   APPEARANCEUR CLEAR 12/01/2021 2010   LABSPEC 1.008 12/01/2021 2010   PHURINE 7.0 12/01/2021 2010   GLUCOSEU NEGATIVE 12/01/2021 2010   GLUCOSEU NEG mg/dL 09/05/2007 2337   HGBUR NEGATIVE 12/01/2021 2010   HGBUR negative 03/13/2011 0957   BILIRUBINUR NEGATIVE 12/01/2021 2010   BILIRUBINUR small (A) 07/18/2021 1411   BILIRUBINUR neg 06/23/2018 1434   [place] 12/01/2021 2010   PROTEINUR NEGATIVE 12/01/2021 2010   UROBILINOGEN 4.0 (A) 07/18/2021 1411   UROBILINOGEN 0.2 07/03/2015 2014   NITRITE NEGATIVE 12/01/2021 2010   LEUKOCYTESUR NEGATIVE 12/01/2021 2010   Sepsis Labs Invalid input(s): PROCALCITONIN,  WBC,  LACTICIDVEN Microbiology Recent Results (from the past 240 hour(s))  Resp Panel by RT-PCR (Flu A&B, Covid) Nasopharyngeal Swab     Status: None   Collection Time: 12/01/21  9:33 PM   Specimen: Nasopharyngeal Swab; Nasopharyngeal(NP) swabs in vial transport medium  Result Value Ref Range Status   SARS Coronavirus 2 by RT PCR NEGATIVE NEGATIVE Final    Comment: (NOTE) SARS-CoV-2 target nucleic acids are NOT DETECTED.  The SARS-CoV-2 RNA is generally detectable in upper respiratory specimens during the acute phase of infection. The lowest concentration of SARS-CoV-2 viral copies this assay can detect is 138 copies/mL. A negative result does not preclude SARS-Cov-2 infection and should not be used as the sole basis for treatment or other patient management decisions. A negative result may occur with  improper specimen collection/handling, submission of specimen other than nasopharyngeal swab, presence of viral mutation(s) within the areas targeted by this assay, and inadequate number of viral copies(<138 copies/mL). A negative result must be combined with clinical observations, patient history, and epidemiological information. The expected result is Negative.  Fact Sheet for Patients:   EntrepreneurPulse.com.au  Fact Sheet for Healthcare Providers:  IncredibleEmployment.be  This test is no t yet approved or cleared by the [place] FDA and  has been authorized for detection and/or diagnosis of SARS-CoV-2 by FDA under an Emergency Use Authorization (EUA). This EUA will remain  in effect (meaning this test can be used) for the duration of the COVID-19 declaration under Section 564(b)(1) of the Act, 21 U.S.C.section 360bbb-3(b)(1), unless the authorization is terminated  or revoked sooner.       Influenza A by PCR NEGATIVE NEGATIVE Final   Influenza B by PCR NEGATIVE NEGATIVE Final    Comment: (NOTE) The Xpert Xpress SARS-CoV-2/FLU/RSV plus assay is intended as an aid in the diagnosis of influenza from Nasopharyngeal swab specimens and should not be used as a sole basis for treatment. Nasal washings and aspirates are unacceptable for Xpert Xpress SARS-CoV-2/FLU/RSV testing.  Fact Sheet for Patients: EntrepreneurPulse.com.au  Fact Sheet for Healthcare Providers: IncredibleEmployment.be  This test is not yet approved or cleared by the [place] FDA and has been authorized for detection and/or diagnosis of SARS-CoV-2 by FDA under an Emergency Use Authorization (EUA). This EUA will remain in effect (meaning this test can be used) for the duration of the COVID-19 declaration under Section 564(b)(1) of the Act, 21 U.S.C. section 360bbb-3(b)(1), unless the authorization is terminated or revoked.  Performed at [facility], [address], [place], [place] [address]   Gastrointestinal Panel by PCR , Stool  Status: None   Collection Time: 12/03/21  4:28 PM   Specimen: Stool  Result Value Ref Range Status   Campylobacter species NOT DETECTED NOT DETECTED Final   Plesimonas shigelloides NOT DETECTED NOT DETECTED Final   Salmonella species NOT DETECTED NOT DETECTED Final   Yersinia  enterocolitica NOT DETECTED NOT DETECTED Final   Vibrio species NOT DETECTED NOT DETECTED Final   Vibrio cholerae NOT DETECTED NOT DETECTED Final   Enteroaggregative E coli (EAEC) NOT DETECTED NOT DETECTED Final   Enteropathogenic E coli (EPEC) NOT DETECTED NOT DETECTED Final   Enterotoxigenic E coli (ETEC) NOT DETECTED NOT DETECTED Final   Shiga like toxin producing E coli (STEC) NOT DETECTED NOT DETECTED Final   Shigella/Enteroinvasive E coli (EIEC) NOT DETECTED NOT DETECTED Final   Cryptosporidium NOT DETECTED NOT DETECTED Final   Cyclospora cayetanensis NOT DETECTED NOT DETECTED Final   Entamoeba histolytica NOT DETECTED NOT DETECTED Final   Giardia lamblia NOT DETECTED NOT DETECTED Final   Adenovirus F40/41 NOT DETECTED NOT DETECTED Final   Astrovirus NOT DETECTED NOT DETECTED Final   Norovirus GI/GII NOT DETECTED NOT DETECTED Final   Rotavirus A NOT DETECTED NOT DETECTED Final   Sapovirus (I, II, IV, and V) NOT DETECTED NOT DETECTED Final    Comment: Performed at [facility], [address] [place], [place] [address]  C Difficile Quick Screen w PCR reflex     Status: None   Collection Time: 12/03/21  4:28 PM   Specimen: STOOL  Result Value Ref Range Status   C Diff antigen NEGATIVE NEGATIVE Final   C Diff toxin NEGATIVE NEGATIVE Final   C Diff interpretation No C. difficile detected.  Final    Comment: Performed at [facility], [address] [place], [place] [address]   Time coordinating discharge: 36 mins  SIGNED:  Irwin Brakeman, MD  [facility] 12/05/2021, 11:40 AM How to contact the [facility] Attending or Consulting provider [place] or covering provider during after hours [place], for this patient?  Check the care team in [facility] and look for a) attending/consulting [facility] provider listed and b) the [facility] team listed Log into [email or web address] and use [place] universal password to access. If you do not have the password, please contact the hospital  operator. Locate the [facility] provider you are looking for under [facility] and page to a number that you can be directly reached. If you still have difficulty reaching the provider, please page the [facility] (Director on Call) for the Hospitalists listed on amion for assistance.

## 2021-12-05 NOTE — Discharge Instructions (Signed)
PLEASE FOLLOW UP WITH YOUR GI FOR REFERRAL TO [facility] PLEASE FOLLOW UP WITH DR. Jenetta Downer IN 2 WEEKS PLEASE FOLLOW UP WITH YOUR PRIMARY CARE CLINIC IN 1 WEEK TO HAVE LABS (POTASSIUM CHECKED)     IMPORTANT INFORMATION: PAY CLOSE ATTENTION   PHYSICIAN DISCHARGE INSTRUCTIONS  Follow with Primary care provider  Fayrene Helper, MD  and other consultants as instructed by your Hospitalist Physician  [place] IF SYMPTOMS COME BACK, WORSEN OR NEW PROBLEM DEVELOPS   Please note: You were cared for by a hospitalist during your hospital stay. Every effort will be made to forward records to your primary care provider.  You can request that your primary care provider send for your hospital records if they have not received them.  Once you are discharged, your primary care physician will handle any further medical issues. Please note that NO REFILLS for any discharge medications will be authorized once you are discharged, as it is imperative that you return to your primary care physician (or establish a relationship with a primary care physician if you do not have one) for your post hospital discharge needs so that they can reassess your need for medications and monitor your lab values.  Please get a complete blood count and chemistry panel checked by your Primary MD at your next visit, and again as instructed by your Primary MD.  Get Medicines reviewed and adjusted: Please take all your medications with you for your next visit with your Primary MD  Laboratory/radiological data: Please request your Primary MD to go over all hospital tests and procedure/radiological results at the follow up, please ask your primary care provider to get all Hospital records sent to his/her office.  In some cases, they will be blood work, cultures and biopsy results pending at the time of your discharge. Please request that your primary care provider follow up on these  results.  If you are diabetic, please bring your blood sugar readings with you to your follow up appointment with primary care.    Please call and make your follow up appointments as soon as possible.    Also Note the following: If you experience worsening of your admission symptoms, develop shortness of breath, life threatening emergency, suicidal or homicidal thoughts you must seek medical attention immediately by calling 911 or calling your MD immediately  if symptoms less severe.  You must read complete instructions/literature along with all the possible adverse reactions/side effects for all the Medicines you take and that have been prescribed to you. Take any new Medicines after you have completely understood and accpet all the possible adverse reactions/side effects.   Do not drive when taking Pain medications or sleeping medications (Benzodiazepines)  Do not take more than prescribed Pain, Sleep and Anxiety Medications. It is not advisable to combine anxiety,sleep and pain medications without talking with your primary care practitioner  Special Instructions: If you have smoked or chewed Tobacco  in the last 2 yrs please stop smoking, stop any regular Alcohol  and or any Recreational drug use.  Wear Seat belts while driving.  Do not drive if taking any narcotic, mind altering or controlled substances or recreational drugs or alcohol.

## 2021-12-05 NOTE — Progress Notes (Signed)
    Subjective: Ate a bacon biscuit this morning. Felt nauseated last night after procedure but better today. Graham crackers last night. No vomiting today. No abdominal pain. Feels better. No diarrhea.   Objective: Vital signs in last 24 hours: Temp:  [97.9 F (36.6 C)-98.5 F (36.9 C)] 97.9 F (36.6 C) (12/09 0512) Pulse Rate:  [73-82] 80 (12/09 0512) Resp:  [13-23] 18 (12/09 0512) BP: (84-124)/(49-77) 84/49 (12/09 0512) SpO2:  [93 %-98 %] 94 % (12/09 0512) FiO2 (%):  [21 %] 21 % (12/08 1237) Last BM Date: 12/04/21 General:   Alert and oriented, pleasant Head:  Normocephalic and atraumatic. Eyes:  No icterus, sclera clear. Conjuctiva pink.  Mouth:  Without lesions, mucosa pink and moist.  Abdomen:  Bowel sounds present, soft, non-tender, non-distended. Large ventral midline hernia Extremities:  Without edema. Neurologic:  Alert and  oriented x4 Psych:  Alert and cooperative. Normal mood and affect.  Intake/Output from previous day: 12/08 0701 - 12/09 0700 In: 1451.6 [P.O.:400; I.V.:793.5; IV Piggyback:258.1] Out: -  Intake/Output this shift: Total I/O In: 480 [P.O.:480] Out: -   Lab Results: Recent Labs    12/03/21 0435 12/04/21 0519 12/05/21 0432  WBC 4.6 4.6 4.7  HGB 11.0* 11.0* 10.4*  HCT 34.4* 34.6* 33.3*  PLT 189 200 183   BMET Recent Labs    12/03/21 0435 12/04/21 0519 12/04/21 1347 12/05/21 0432  NA 141  143 142  --  142  K 2.7*  2.8* 2.9* 3.7 3.3*  CL 106  106 108  --  111  CO2 28  29 27   --  24  GLUCOSE 91  93 101*  --  108*  BUN 6  6 5*  --  5*  CREATININE 0.66  0.64 0.68  --  0.65  CALCIUM 8.3*  8.4* 8.2*  --  7.7*   LFT Recent Labs    12/02/21 1509 12/03/21 0435 12/04/21 0519  PROT  --   --  4.7*  ALBUMIN 2.8* 2.7* 2.6*  AST  --   --  19  ALT  --   --  24  ALKPHOS  --   --  74  BILITOT  --   --  0.5     Assessment:  [name] is a 60 year old female with complicated past medical history to include partial  colectomy due to redundant colon with primary anastomosis, perforated bowel r/t gynecologic procedure 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 Now admitted following syncopal episode at home with ongoing nausea/vomiting, and abdominal distention.  EGD yesterday with gastritis s/p biopsy. She has had marked improvement from admission and tolerated a bacon biscuit this morning.  She is appropriate for discharge home with close outpatient follow-up.   Plan: Discharge home today Continue erythromycin TID before meals Continue pantoprazole BID Will arrange follow-up with Dr. Jenetta Downer as outpatient.    [name], PhD, ANP-BC [facility]     LOS: 1 day    12/05/2021, 11:33 AM

## 2021-12-05 NOTE — Telephone Encounter (Signed)
Appt is scheduled for 12/20. Patient to be discharged today.

## 2021-12-05 NOTE — Care Management Important Message (Signed)
Important Message  Patient Details  Name: [name] MRN: [id number] Date of Birth: 27-Nov-1961   Medicare Important Message Given:  Yes     Tommy Medal 12/05/2021, 11:14 AM

## 2021-12-05 NOTE — Progress Notes (Signed)
Nsg Discharge Note  Admit Date:  12/01/2021 Discharge date: 12/05/2021   [name] to be D/C'd Home per MD order.  AVS completed.   Patient/caregiver able to verbalize understanding.  Discharge Medication: Allergies as of 12/05/2021       Reactions   Nitrofurantoin Hives   Crestor [rosuvastatin Calcium] Other (See Comments)   Generalized cramps   Bisacodyl Other (See Comments)   Makes patient feel like she is having cramps Makes patient feel like she is having cramps   Clarithromycin Hives, Other (See Comments)   Other reaction(s): Other (See Comments) Cluster migraines Cluster migraines   Clarithromycin Other (See Comments)   Cluster migraines   Clindamycin Hcl Hives   Codeine Itching   Iron Sucrose Other (See Comments)   [place]- required benadryl and solu medrol   Monosodium Glutamate Other (See Comments)   Cluster migraines Cluster migraines   Scopolamine Hbr Other (See Comments)   Cluster migraines, impaired vision        Medication List     STOP taking these medications    ICaps Areds 2 Caps   metoprolol tartrate 25 MG tablet Commonly known as: LOPRESSOR   Motegrity 1 MG Tabs Generic drug: Prucalopride Succinate   predniSONE 10 MG tablet Commonly known as: DELTASONE   promethazine 25 MG tablet Commonly known as: PHENERGAN   rifaximin 550 MG Tabs tablet Commonly known as: XIFAXAN   vitamin C 500 MG tablet Commonly known as: ASCORBIC ACID   VITAMINS FOR HAIR PO       TAKE these medications    ALPRAZolam 1 MG tablet Commonly known as: XANAX Take 1 tablet (1 mg total) by mouth 2 (two) times daily.   aspirin 81 MG chewable tablet Chew 81 mg by mouth daily.   butalbital-acetaminophen-caffeine 50-325-40 MG tablet Commonly known as: FIORICET Take 1 tablet by mouth as directed.   conjugated estrogens 0.625 MG/GM vaginal cream Commonly known as: PREMARIN Place fingertip amount intravaginally 2 to 3 times per week What changed:   how much to take how to take this when to take this   cyanocobalamin 1000 MCG/ML injection Commonly known as: (VITAMIN B-12) Inject 1 mL (1,000 mcg total) into the muscle every 30 (thirty) days.   cyclobenzaprine 10 MG tablet Commonly known as: FLEXERIL TAKE 1 TABLET BY MOUTH TWICE DAILY AS NEEDED FOR MUSCLE SPASMS   cycloSPORINE 0.05 % ophthalmic emulsion Commonly known as: RESTASIS Place 1 drop into both eyes 2 (two) times daily as needed (dry eyes).   DULoxetine 60 MG capsule Commonly known as: CYMBALTA Take 60 mg by mouth daily.   Erythromycin 250 MG Tbec Take 250 mg by mouth 3 (three) times daily before meals.   ezetimibe 10 MG tablet Commonly known as: Zetia Take 1 tablet (10 mg total) by mouth daily.   fluticasone 50 MCG/ACT nasal spray Commonly known as: FLONASE Place 2 sprays into both nostrils daily as needed for allergies.   HYDROmorphone 4 MG tablet Commonly known as: DILAUDID Take 4 mg by mouth in the morning, at noon, and at bedtime.   levothyroxine 125 MCG tablet Commonly known as: SYNTHROID TAKE 1 TABLET(125 MCG) BY MOUTH DAILY BEFORE BREAKFAST   magnesium oxide 400 (240 Mg) MG tablet Commonly known as: MAG-OX Take 1 tablet (400 mg total) by mouth 2 (two) times daily. Please make overdue appt with Dr. Caryl Comes before anymore refills. Thank you 1st attempt What changed: when to take this   meclizine 25 MG tablet Commonly known as:  ANTIVERT Take 1 tablet (25 mg total) by mouth 3 (three) times daily as needed for dizziness.   omeprazole 40 MG capsule Commonly known as: PRILOSEC Take 1 capsule (40 mg total) by mouth in the morning and at bedtime.   potassium chloride 10 MEQ tablet Commonly known as: KLOR-CON M Take 1 tablet (10 mEq total) by mouth daily.   pravastatin 40 MG tablet Commonly known as: PRAVACHOL TAKE 1 TABLET(40 MG) BY MOUTH DAILY What changed:  how much to take how to take this when to take this   senna-docusate 8.6-50 MG  tablet Commonly known as: Senokot-S Take 2 tablets by mouth 2 (two) times daily. constipation   sucralfate 1 g tablet Commonly known as: CARAFATE Take 1 tablet (1 g total) by mouth 4 (four) times daily -  with meals and at bedtime.   Xarelto 20 MG Tabs tablet Generic drug: rivaroxaban TAKE 1 TABLET(20 MG) BY MOUTH DAILY What changed: See the new instructions.        Discharge Assessment: Vitals:   12/04/21 2242 12/05/21 0512  BP: (!) 102/50 (!) 84/49  Pulse:  80  Resp:  18  Temp:  97.9 F (36.6 C)  SpO2:  94%   Skin clean, dry and intact without evidence of skin break down, no evidence of skin tears noted. IV catheter discontinued intact. Site without signs and symptoms of complications - no redness or edema noted at insertion site, patient denies c/o pain - only slight tenderness at site.  Dressing with slight pressure applied.  D/c Instructions-Education: Discharge instructions given to patient/family with verbalized understanding. D/c education completed with patient/family including follow up instructions, medication list, d/c activities limitations if indicated, with other d/c instructions as indicated by MD - patient able to verbalize understanding, all questions fully answered. Patient instructed to return to ED, call 911, or call MD for any changes in condition.  Patient escorted via [place], and D/C home via private auto.  Kathie Rhodes, RN 12/05/2021 12:59 PM

## 2021-12-08 LAB — SURGICAL PATHOLOGY

## 2021-12-08 NOTE — Progress Notes (Deleted)
RESCHEDULED - LABS NOT OBTAINED

## 2021-12-09 ENCOUNTER — Other Ambulatory Visit: Payer: Self-pay

## 2021-12-09 ENCOUNTER — Inpatient Hospital Stay (HOSPITAL_COMMUNITY): Payer: Medicare Other | Admitting: Physician Assistant

## 2021-12-10 ENCOUNTER — Ambulatory Visit: Payer: Medicare Other | Admitting: Nurse Practitioner

## 2021-12-10 ENCOUNTER — Inpatient Hospital Stay (HOSPITAL_COMMUNITY): Payer: Medicare Other | Attending: Hematology

## 2021-12-11 ENCOUNTER — Other Ambulatory Visit: Payer: Self-pay

## 2021-12-11 ENCOUNTER — Inpatient Hospital Stay (HOSPITAL_COMMUNITY): Payer: Medicare Other | Admitting: Physician Assistant

## 2021-12-12 ENCOUNTER — Ambulatory Visit (INDEPENDENT_AMBULATORY_CARE_PROVIDER_SITE_OTHER): Payer: Medicare Other | Admitting: Nurse Practitioner

## 2021-12-12 ENCOUNTER — Encounter: Payer: Self-pay | Admitting: Nurse Practitioner

## 2021-12-12 ENCOUNTER — Other Ambulatory Visit (HOSPITAL_COMMUNITY)
Admission: RE | Admit: 2021-12-12 | Discharge: 2021-12-12 | Disposition: A | Discharge status: Home / Self Care | Payer: Medicare Other | Source: Ambulatory Visit | Attending: Physician Assistant | Admitting: Physician Assistant

## 2021-12-12 ENCOUNTER — Other Ambulatory Visit (HOSPITAL_COMMUNITY)
Admission: RE | Admit: 2021-12-12 | Discharge: 2021-12-12 | Disposition: A | Discharge status: Home / Self Care | Payer: Medicare Other | Source: Ambulatory Visit | Attending: Nurse Practitioner | Admitting: Nurse Practitioner

## 2021-12-12 VITALS — BP 115/77 | HR 99 | Ht 64.0 in | Wt 189.0 lb

## 2021-12-12 DIAGNOSIS — E876 Hypokalemia: Secondary | ICD-10-CM | POA: Diagnosis not present

## 2021-12-12 DIAGNOSIS — D509 Iron deficiency anemia, unspecified: Secondary | ICD-10-CM | POA: Diagnosis not present

## 2021-12-12 DIAGNOSIS — E538 Deficiency of other specified B group vitamins: Secondary | ICD-10-CM | POA: Insufficient documentation

## 2021-12-12 DIAGNOSIS — I10 Essential (primary) hypertension: Secondary | ICD-10-CM

## 2021-12-12 DIAGNOSIS — K912 Postsurgical malabsorption, not elsewhere classified: Secondary | ICD-10-CM | POA: Diagnosis not present

## 2021-12-12 DIAGNOSIS — R1084 Generalized abdominal pain: Secondary | ICD-10-CM

## 2021-12-12 DIAGNOSIS — Z79899 Other long term (current) drug therapy: Secondary | ICD-10-CM | POA: Diagnosis not present

## 2021-12-12 LAB — VITAMIN D 25 HYDROXY (VIT D DEFICIENCY, FRACTURES): Vit D, 25-Hydroxy: 101.96 ng/mL — ABNORMAL HIGH (ref 30–100)

## 2021-12-12 LAB — IRON AND TIBC
Iron: 53 ug/dL (ref 28–170)
Saturation Ratios: 16 % (ref 10.4–31.8)
TIBC: 322 ug/dL (ref 250–450)
UIBC: 269 ug/dL

## 2021-12-12 LAB — BASIC METABOLIC PANEL
Anion gap: 12 (ref 5–15)
BUN: 21 mg/dL — ABNORMAL HIGH (ref 6–20)
CO2: 24 mmol/L (ref 22–32)
Calcium: 9 mg/dL (ref 8.9–10.3)
Chloride: 103 mmol/L (ref 98–111)
Creatinine, Ser: 0.79 mg/dL (ref 0.44–1.00)
GFR, Estimated: >60 mL/min (ref >60)
Glucose, Bld: 111 mg/dL — ABNORMAL HIGH (ref 70–99)
Potassium: 3.5 mmol/L (ref 3.5–5.1)
Sodium: 139 mmol/L (ref 135–145)

## 2021-12-12 LAB — CBC WITH DIFFERENTIAL/PLATELET
Abs Immature Granulocytes: 0.02 10*3/uL (ref 0.00–0.07)
Basophils Absolute: 0 10*3/uL (ref 0.0–0.1)
Basophils Relative: 1 %
Eosinophils Absolute: 0.1 10*3/uL (ref 0.0–0.5)
Eosinophils Relative: 2 %
HCT: 39.7 % (ref 36.0–46.0)
Hemoglobin: 12.9 g/dL (ref 12.0–15.0)
Immature Granulocytes: 0 %
Lymphocytes Relative: 29 %
Lymphs Abs: 1.9 10*3/uL (ref 0.7–4.0)
MCH: 30.7 pg (ref 26.0–34.0)
MCHC: 32.5 g/dL (ref 30.0–36.0)
MCV: 94.5 fL (ref 80.0–100.0)
Monocytes Absolute: 0.5 10*3/uL (ref 0.1–1.0)
Monocytes Relative: 8 %
Neutro Abs: 3.9 10*3/uL (ref 1.7–7.7)
Neutrophils Relative %: 60 %
Platelets: 198 10*3/uL (ref 150–400)
RBC: 4.2 MIL/uL (ref 3.87–5.11)
RDW: 13.7 % (ref 11.5–15.5)
WBC: 6.5 10*3/uL (ref 4.0–10.5)
nRBC: 0 % (ref 0.0–0.2)

## 2021-12-12 LAB — FERRITIN: Ferritin: 125 ng/mL (ref 11–307)

## 2021-12-12 LAB — FOLATE: Folate: 12.3 ng/mL (ref >5.9)

## 2021-12-12 LAB — MAGNESIUM: Magnesium: 1.4 mg/dL — ABNORMAL LOW (ref 1.7–2.4)

## 2021-12-12 LAB — VITAMIN B12: Vitamin B-12: 594 pg/mL (ref 180–914)

## 2021-12-12 MED ORDER — ALPRAZOLAM 1 MG PO TABS: 1.0000 mg | ORAL_TABLET | Freq: Two times a day (BID) | ORAL | 0 refills | Status: DC

## 2021-12-12 NOTE — Assessment & Plan Note (Signed)
Continues to have abdominal pain,NVD, she has an upcoming appointment with GI. PT told to drink plenty of fluid to stay hydrated.

## 2021-12-12 NOTE — Progress Notes (Addendum)
° °  [name]     MRN: [id number]      DOB: 03-26-1961   HPI [name] is here for follow for post hospital visit.   Pt was admitted from 12/01/2021 to 12/05/2021. She was treated for hypokalemia and hypomagnesia., dehydration, intractable N/V/D, syncope, she is here today to have her BMET for potasium check. Pt would also have her magnesium level checked. Dehydration was treated with IVF, hypokalemia treated with  IV potasium in the hospital and DC home with KCL 10 meq po daily for 10 days.   She has an upcoming appointment with GI on 12/16/2021. She will follow up with them about her EGD biopsy result.   GI is trying to get her to [facility] clinic.   She is still having stomach pain,intermittent  N/V/D.   ROS Denies recent fever or chills. Denies sinus pressure, nasal congestion, ear pain or sore throat. Denies chest congestion, productive cough or wheezing. Denies chest pains, palpitations and leg swelling Has abdominal pain, nausea, vomiting,diarrhea,constipation.   Denies dysuria, frequency, hesitancy or incontinence. Denies joint pain, swelling and limitation in mobility. Denies headaches, seizures, numbness, or tingling. Denies depression, anxiety or insomnia. Denies skin break down or rash.   PE  BP 115/77 (BP Location: Right Arm, Patient Position: Sitting, Cuff Size: Normal)    Pulse 99    Ht 5\' 4"  (1.626 m)    Wt 189 lb (85.7 kg)    SpO2 94%    BMI 32.44 kg/m   Patient alert and oriented and in no cardiopulmonary distress.  HEENT: No facial asymmetry, EOMI,     Neck supple .  Chest: Clear to auscultation bilaterally.  CVS: S1, S2 no murmurs, no S3.Regular rate.  ABD: unable to assess pt was guarding her abdomen   Ext: No edema  MS: Adequate ROM spine, shoulders, hips and knees.  Skin: Intact, no ulcerations or rash noted.  Psych: Good eye contact, normal affect. Memory intact not anxious or depressed appearing.  CNS: CN 2-12 intact, power,   normal throughout.no focal deficits noted.   Assessment & Plan

## 2021-12-12 NOTE — Patient Instructions (Signed)
Please get your labs done today.  It is important that you exercise regularly at least 30 minutes 5 times a week.  Think about what you will eat, plan ahead. Choose " clean, green, fresh or frozen" over canned, processed or packaged foods which are more sugary, salty and fatty. 70 to 75% of food eaten should be vegetables and fruit. Three meals at set times with snacks allowed between meals, but they must be fruit or vegetables. Aim to eat over a 12 hour period , example 7 am to 7 pm, and STOP after  your last meal of the day. Drink water,generally about 64 ounces per day, no other drink is as healthy. Fruit juice is best enjoyed in a healthy way, by EATING the fruit.  Thanks for choosing [facility], we consider it a privelige to serve you.

## 2021-12-12 NOTE — Assessment & Plan Note (Signed)
BP Readings from Last 3 Encounters:  12/12/21 115/77  12/05/21 (!) 84/49  11/18/21 117/77  bp well controlled , not on medications.

## 2021-12-12 NOTE — Assessment & Plan Note (Signed)
BMET today, she has finished taking her potasium.

## 2021-12-14 ENCOUNTER — Encounter: Payer: Self-pay | Admitting: Family Medicine

## 2021-12-14 ENCOUNTER — Encounter (INDEPENDENT_AMBULATORY_CARE_PROVIDER_SITE_OTHER): Payer: Self-pay | Admitting: Gastroenterology

## 2021-12-15 ENCOUNTER — Ambulatory Visit: Payer: Medicare Other | Admitting: Internal Medicine

## 2021-12-15 ENCOUNTER — Other Ambulatory Visit: Payer: Self-pay

## 2021-12-15 ENCOUNTER — Encounter: Payer: Self-pay | Admitting: Internal Medicine

## 2021-12-15 DIAGNOSIS — U071 COVID-19: Secondary | ICD-10-CM

## 2021-12-15 LAB — METHYLMALONIC ACID, SERUM: Methylmalonic Acid, Quantitative: 101 nmol/L (ref 0–378)

## 2021-12-15 MED ORDER — BENZONATATE 200 MG PO CAPS: 200.0000 mg | ORAL_CAPSULE | Freq: Two times a day (BID) | ORAL | 0 refills | Status: DC | PRN

## 2021-12-15 MED ORDER — MOLNUPIRAVIR EUA 200MG CAPSULE: 4.0000 | ORAL_CAPSULE | Freq: Two times a day (BID) | ORAL | 0 refills | Status: AC

## 2021-12-15 NOTE — Progress Notes (Signed)
Virtual Visit via Telephone Note   This visit type was conducted due to national recommendations for restrictions regarding the COVID-19 Pandemic (e.g. social distancing) in an effort to limit this patient's exposure and mitigate transmission in our community.  Due to her co-morbid illnesses, this patient is at least at moderate risk for complications without adequate follow up.  This format is felt to be most appropriate for this patient at this time.  The patient did not have access to video technology/had technical difficulties with video requiring transitioning to audio format only (telephone).  All issues noted in this document were discussed and addressed.  No physical exam could be performed with this format.  Evaluation Performed:  Follow-up visit  Date:  12/15/2021   ID:  [name], DOB 11-19-1961, MRN [id number]  Patient Location: Home Provider Location: Office/Clinic  Participants: Patient Location of Patient: Home Location of Provider: Telehealth Consent was obtain for visit to be over via telehealth. I verified that I am speaking with the correct person using two identifiers.  PCP:  Fayrene Helper, MD   Chief Complaint: Fever and cough  History of Present Illness:    [name] is a 60 y.o. female who has a televisit for complaint of fever, chills, cough, sore throat and fatigue for the last 2 days.  She tested positive for COVID at home yesterday.  She denies any dyspnea, chest pain or palpitations currently.  She has had COVID vaccines.  Of note, she is on Xarelto for history of PE.  The patient does have symptoms concerning for COVID-19 infection (fever, chills, cough, or new shortness of breath).   Past Medical, Surgical, Social History, Allergies, and Medications have been Reviewed.  Past Medical History:  Diagnosis Date   Abdominal hernia 08/14/2019   Abdominal pain 06/06/2019   Acute bronchitis 05/01/2013   Acute cystitis 06/09/2010    Qualifier: Diagnosis of  By: Barbara Razor LPN, Brandi     Acute renal insufficiency 08/28/2011   Allergy    Phreesia 03/16/2021   Anemia    Phreesia 10/29/2020   Anxiety    Anxiety state 03/16/2008   Qualifier: Diagnosis of  By: [name]     Arthritis    Phreesia 10/29/2020   Back pain with radiation 07/17/2014   Bilateral hand pain 07/27/2016   Blood transfusion without reported diagnosis    Phreesia 10/29/2020   Breast cancer ([place])    L breast- 2006   Cancer ([place])    Phreesia 10/29/2020   Carpal tunnel syndrome    Chronic constipation    Chronic pain syndrome    followed by [facility]---  back   Clotting disorder ([place])    Phreesia 10/29/2020   Depression    Dermatitis 12/15/2011   Difficult intubation    Educated about COVID-19 virus infection 05/14/2019   Fall at home 01/26/2016   Family history of adverse reaction to anesthesia    MOTHER--- PONV   Fatigue 09/17/2012   Fibromyalgia    FIBROMYALGIA 03/16/2008   Qualifier: Diagnosis of  By: [name]     GERD (gastroesophageal reflux disease)    Headache disorder 01/16/2013   Headache syndrome 01/26/2016   Hip pain, right 07/08/2019   History of MRSA infection    lip abscess   History of ovarian cyst 06/2011   s/p  BSO   History of pulmonary embolus (PE) 1997   post EP with ablation pulmonary veouns for SVT/ Atrial Fib.  History of supraventricular tachycardia    s/p  ablation 1996  and 1997  by dr Caryl Comes   History of TIA (transient ischemic attack) 1997   post op EP ablation PE   Hyperlipidemia    Hyperlipidemia LDL goal <100 03/16/2008   Qualifier: Diagnosis of  By: [name]     Hypothyroidism    followed by pcp   Incisional hernia    Insomnia 12/15/2011   Intermittent palpitations 08/22/2017   Interstitial cystitis    09-13-2018   per pt last flare-up  May 2019 (followed by pcp)   INTERSTITIAL CYSTITIS 02/17/2011   Qualifier: Diagnosis of  By: Barbara Cipro MD, Margaret     Iron deficiency anemia     09-13-2018  PER PT STABLE   Irregular heart rate 07/25/2013   Lipoma of back    upper   Low back pain radiating to right leg 07/04/2019   Low ferritin level 06/14/2014   MDD (major depressive disorder), single episode, in full remission ([place]) 10/27/2017   Medically noncompliant 02/28/2015   Multiple missed appointments, both follow-up appointments and lab appointments.    Metabolic syndrome X 4/69/6295   Qualifier: Diagnosis of  By: Barbara Cipro MD, Margaret  hBA1c is 5.8 in 02/2013    Migraines    Morbid obesity ([place]) 03/27/2013   Muscle spasm 01/03/2020   Nausea alone 07/17/2014   NECK PAIN, CHRONIC 03/16/2008   Qualifier: Diagnosis of  By: [name]     Normal coronary arteries    a. by CT 12/2018.   Obesity 03/16/2008   Qualifier: Diagnosis of  By: [name]     Oral ulceration 08/23/2014   Presented at 06/14/2014 visit    Other malaise and fatigue 03/16/2008   Centricity Description: FATIGUE, CHRONIC Qualifier: Diagnosis of  By: [name]   Centricity Description: FATIGUE Qualifier: Diagnosis of  By: Claybon Jabs PA, Dawn     OVARIAN CYST 12/26/2009   Qualifier: Diagnosis of  By: Barbara Cipro MD, Margaret     Paget disease of breast, left [facility])    Paget's disease of breast, left ([place]) 03/16/2008   Qualifier: Diagnosis of  By: [name]  Left diagnosed in 2006 F/h breast cancer x 15 family members   Partial small bowel obstruction ([place]) 07/04/2012   PONV (postoperative nausea and vomiting)    SEVERE   Postsurgical menopause 11/13/2011   Presence of IVC filter 06/06/2019   PSVT (paroxysmal supraventricular tachycardia) ([place])    [place]   Recurrent oral herpes simplex infection 10/13/2017   ROM (right otitis media) 01/23/2013   S/P insertion of IVC (inferior vena caval) filter 05/08/2005   [place] (non-retrievable)  /  dx 2019  a leg of filter is protruding thru the vena cava in to right L2 vertebral body (09-13-2018  per pt having surgery to remove filter in [place])    S/P radiofrequency ablation operation for arrhythmia 1996   1996  and 1997,   SVT and Atrial Fib   Sinusitis, chronic 01/26/2016   SMALL BOWEL OBSTRUCTION, HX OF 08/07/2008   Annotation: obstruction w/ adhesions led to partial colectomy Qualifier: Diagnosis of  By: Craige Cotta     Stroke [facility])    [place] 10/29/2020, TIAs in the past   Swelling of hand 09/17/2012   Syncope    Tachycardia 02/03/2010   Qualifier: Diagnosis of  By: Via LPN, Lynn     Thyroid disease    Phreesia 10/29/2020   Ulcer 12/15/2011   Urinary frequency  07/18/2014   Vaginitis and vulvovaginitis 05/10/2013   Vitamin D deficiency 05/05/2015   Wears glasses    Past Surgical History:  Procedure Laterality Date   ABDOMINAL HYSTERECTOMY  1987   ANTERIOR CERVICAL DECOMP/DISCECTOMY FUSION  03-07-2002   dr elsner  @[facility]    C 4 -- 5   APPENDECTOMY  1980   AUGMENTATION MAMMAPLASTY Right 2006   BILATERAL SALPINGOOPHORECTOMY  07/24/2011   via Explor. Lap. w/ intraoperative perf. bowel repair   BIOPSY  05/02/2021   Procedure: BIOPSY;  Surgeon: Montez Morita, Quillian Quince, MD;  [facility];  Service: Gastroenterology;;  small bowel, mid esophagus, distal esophagus, random colon biopsies   BIOPSY  12/04/2021   Procedure: BIOPSY;  Surgeon: [name], DO;  [facility];  Service: Endoscopy;;   BREAST BIOPSY Right 2019   benign   BREAST ENHANCEMENT SURGERY Bilateral 1993   BREAST IMPLANT REMOVAL Bilateral    BREAST SURGERY N/A    Phreesia 10/29/2020   CARDIAC CATHETERIZATION  07-06-2003   dr Darnell Level brodie   normal coronaries and LVF   [place] TEST  11-18-2015   dr Caryl Comes   normal nuclear study w/ no ischemia/  normal LV function and wall motion , ef 84%   CARPAL TUNNEL RELEASE Right ?   CESAREAN SECTION  1986   CESAREAN SECTION N/A    Phreesia 10/29/2020   CHOLECYSTECTOMY N/A    Phreesia 10/29/2020   COLON SURGERY      COLONOSCOPY WITH PROPOFOL N/A 05/02/2021   Procedure: COLONOSCOPY WITH PROPOFOL;  Surgeon: Harvel Quale, MD;  [facility];  Service: Gastroenterology;  Laterality: N/A;  12:30 PM   CYSTO/  HYDRODISTENTION/  INSTILATION THERAPY  MULTIPLE   ENTEROCUTANEOUS FISTULA CLOSURE  multiple   last one 2015 with small bowel resection   ESOPHAGOGASTRODUODENOSCOPY (EGD) WITH PROPOFOL N/A 05/02/2021   Procedure: ESOPHAGOGASTRODUODENOSCOPY (EGD) WITH PROPOFOL;  Surgeon: Harvel Quale, MD;  [facility];  Service: Gastroenterology;  Laterality: N/A;   ESOPHAGOGASTRODUODENOSCOPY (EGD) WITH PROPOFOL N/A 12/04/2021   Procedure: ESOPHAGOGASTRODUODENOSCOPY (EGD) WITH PROPOFOL;  Surgeon: [name], DO;  [facility];  Service: Endoscopy;  Laterality: N/A;   EXPLORATORY LAPAROTOMY INCISIONAL VENTRAL HERNIA REPAIR / RESECTION SMALL BOWEL  11-09-2014   @[facility]    FRACTURE SURGERY N/A    Phreesia 10/29/2020   JOINT REPLACEMENT N/A    Phreesia 10/29/2020   LIPOMA EXCISION Right 09/16/2018   Procedure: EXCISION LIPOMA UPPER BACK;  Surgeon: Clovis Riley, MD;  Location: [place];  Service: General;  Laterality: Right;   MASTECTOMY Left 2006   w/ reconstruction on left (paget's disease)  and right breast augmentation   POLYPECTOMY  05/02/2021   Procedure: POLYPECTOMY;  Surgeon: Harvel Quale, MD;  [facility];  Service: Gastroenterology;;  gastric   SAVORY DILATION  05/02/2021   Procedure: Azzie Almas DILATION;  Surgeon: Harvel Quale, MD;  [facility];  Service: Gastroenterology;;   SMALL INTESTINE SURGERY N/A    Phreesia 10/29/2020   SPINE SURGERY N/A    Phreesia 10/29/2020   TOTAL COLECTOMY  08-04-2002    @[facility]    AND CHOLECYSTECTOMY  (colonic inertia)   TRANSTHORACIC ECHOCARDIOGRAM  02/04/2016   ef 60-65%,  grade 2 diastolic dysfunction/  mild MR   TUBAL LIGATION N/A    Phreesia 03/16/2021   VENA  CAVA FILTER PLACEMENT  05/08/2005   @  [facility]   [place] (non-retrievable)   WIDE EXCISION PERIRECTAL ABSCESSES  09-22-2005   @ [facility]     Current Meds  Medication Sig   ALPRAZolam (XANAX) 1 MG tablet Take 1 tablet (1 mg total) by mouth 2 (two) times daily.   aspirin 81 MG chewable tablet Chew 81 mg by mouth daily.   butalbital-acetaminophen-caffeine (FIORICET) 50-325-40 MG tablet Take 1 tablet by mouth as directed.   conjugated estrogens (PREMARIN) vaginal cream Place fingertip amount intravaginally 2 to 3 times per week (Patient taking differently: Place 1 mg vaginally at bedtime. Place fingertip amount intravaginally 2 to 3 times per week)   cyanocobalamin (,VITAMIN B-12,) 1000 MCG/ML injection Inject 1 mL (1,000 mcg total) into the muscle every 30 (thirty) days.   cyclobenzaprine (FLEXERIL) 10 MG tablet TAKE 1 TABLET BY MOUTH TWICE DAILY AS NEEDED FOR MUSCLE SPASMS   cycloSPORINE (RESTASIS) 0.05 % ophthalmic emulsion Place 1 drop into both eyes 2 (two) times daily as needed (dry eyes).   DULoxetine (CYMBALTA) 30 MG capsule Take 30 mg by mouth daily.   Erythromycin 250 MG TBEC Take 250 mg by mouth 3 (three) times daily before meals.   ezetimibe (ZETIA) 10 MG tablet Take 1 tablet (10 mg total) by mouth daily.   fluticasone (FLONASE) 50 MCG/ACT nasal spray Place 2 sprays into both nostrils daily as needed for allergies.   HYDROmorphone (DILAUDID) 4 MG tablet Take 4 mg by mouth in the morning, at noon, and at bedtime.   levothyroxine (SYNTHROID) 125 MCG tablet TAKE 1 TABLET(125 MCG) BY MOUTH DAILY BEFORE BREAKFAST   magnesium oxide (MAG-OX) 400 (240 Mg) MG tablet Take 1 tablet (400 mg total) by mouth 2 (two) times daily. Please make overdue appt with Dr. Caryl Comes before anymore refills. Thank you 1st attempt (Patient taking differently: Take 400 mg by mouth daily. Please make overdue appt with Dr. Caryl Comes before anymore refills. Thank you 1st attempt)   meclizine (ANTIVERT) 25 MG tablet Take 1 tablet  (25 mg total) by mouth 3 (three) times daily as needed for dizziness.   omeprazole (PRILOSEC) 40 MG capsule Take 1 capsule (40 mg total) by mouth in the morning and at bedtime.   potassium chloride (KLOR-CON M) 10 MEQ tablet Take 1 tablet (10 mEq total) by mouth daily.   pravastatin (PRAVACHOL) 40 MG tablet TAKE 1 TABLET(40 MG) BY MOUTH DAILY (Patient taking differently: Take 40 mg by mouth daily. TAKE 1 TABLET(40 MG) BY MOUTH DAILY)   senna-docusate (SENOKOT-S) 8.6-50 MG per tablet Take 2 tablets by mouth 2 (two) times daily. constipation   sucralfate (CARAFATE) 1 g tablet Take 1 tablet (1 g total) by mouth 4 (four) times daily -  with meals and at bedtime.   XARELTO 20 MG TABS tablet TAKE 1 TABLET(20 MG) BY MOUTH DAILY (Patient taking differently: Take 20 mg by mouth daily with supper.)   benzonatate (TESSALON) 200 MG capsule Take 1 capsule (200 mg total) by mouth 2 (two) times daily as needed for cough.   molnupiravir EUA (LAGEVRIO) 200 mg CAPS capsule Take 4 capsules (800 mg total) by mouth 2 (two) times daily for 5 days.     Allergies:   Nitrofurantoin, Crestor [rosuvastatin calcium], Bisacodyl, Clarithromycin, Clarithromycin, Clindamycin hcl, Codeine, Iron sucrose, Monosodium glutamate, and Scopolamine hbr   ROS:   Please see the history of present illness.     All other systems reviewed and are negative.   Labs/Other Tests and Data Reviewed:    Recent Labs: 09/11/2021: TSH  2.101 12/04/2021: ALT 24 12/12/2021: BUN 21; Creatinine, Ser 0.79; Hemoglobin 12.9; Magnesium 1.4; Platelets 198; Potassium 3.5; Sodium 139   Recent Lipid Panel Lab Results  Component Value Date/Time   CHOL 127 09/11/2021 02:01 PM   CHOL 146 01/15/2021 03:40 PM   TRIG 278 (H) 09/11/2021 02:01 PM   HDL 37 (L) 09/11/2021 02:01 PM   HDL 42 01/15/2021 03:40 PM   CHOLHDL 3.4 09/11/2021 02:01 PM   LDLCALC 34 09/11/2021 02:01 PM   LDLCALC 60 01/15/2021 03:40 PM   LDLDIRECT 138 (H) 09/15/2012 03:40 PM    Wt  Readings from Last 3 Encounters:  12/12/21 189 lb (85.7 kg)  12/01/21 184 lb (83.5 kg)  11/11/21 188 lb (85.3 kg)      ASSESSMENT & PLAN:    COVID 19 infection Unable to start Paxlovid as she is taking Xarelto Started molnupinavir instead Tessalon as needed for cough Continue NyQuil as needed Advised to contact if she has any new or worsening symptoms  Time:   Today, I have spent 13 minutes reviewing the chart, including problem list, medications, and with the patient with telehealth technology discussing the above problems.   Medication Adjustments/Labs and Tests Ordered: Current medicines are reviewed at length with the patient today.  Concerns regarding medicines are outlined above.   Tests Ordered: No orders of the defined types were placed in this encounter.   Medication Changes: Meds ordered this encounter  Medications   molnupiravir EUA (LAGEVRIO) 200 mg CAPS capsule    Sig: Take 4 capsules (800 mg total) by mouth 2 (two) times daily for 5 days.    Dispense:  40 capsule    Refill:  0   benzonatate (TESSALON) 200 MG capsule    Sig: Take 1 capsule (200 mg total) by mouth 2 (two) times daily as needed for cough.    Dispense:  20 capsule    Refill:  0     Note: This dictation was prepared with Dragon dictation along with smaller phrase technology. Similar sounding words can be transcribed inadequately or may not be corrected upon review. Any transcriptional errors that result from this process are unintentional.      Disposition:  Follow up  Signed, Lindell Spar, MD  12/15/2021 9:38 AM     [facility]

## 2021-12-16 ENCOUNTER — Telehealth (INDEPENDENT_AMBULATORY_CARE_PROVIDER_SITE_OTHER): Payer: Medicare Other | Admitting: Gastroenterology

## 2021-12-16 ENCOUNTER — Telehealth: Payer: Self-pay

## 2021-12-16 ENCOUNTER — Ambulatory Visit (INDEPENDENT_AMBULATORY_CARE_PROVIDER_SITE_OTHER): Payer: Medicare Other | Admitting: Gastroenterology

## 2021-12-16 ENCOUNTER — Other Ambulatory Visit: Payer: Self-pay

## 2021-12-16 NOTE — Telephone Encounter (Signed)
error 

## 2021-12-18 ENCOUNTER — Other Ambulatory Visit (INDEPENDENT_AMBULATORY_CARE_PROVIDER_SITE_OTHER): Payer: Self-pay

## 2021-12-18 ENCOUNTER — Encounter (INDEPENDENT_AMBULATORY_CARE_PROVIDER_SITE_OTHER): Payer: Self-pay | Admitting: Gastroenterology

## 2021-12-18 ENCOUNTER — Encounter: Payer: Self-pay | Admitting: Family Medicine

## 2021-12-18 ENCOUNTER — Other Ambulatory Visit (INDEPENDENT_AMBULATORY_CARE_PROVIDER_SITE_OTHER): Payer: Self-pay | Admitting: Gastroenterology

## 2021-12-18 LAB — COPPER, SERUM: Copper: 84 ug/dL (ref 80–158)

## 2021-12-18 MED ORDER — SUCRALFATE 1 G PO TABS: 1.0000 g | ORAL_TABLET | Freq: Three times a day (TID) | ORAL | 2 refills | Status: DC

## 2021-12-18 MED ORDER — ERYTHROMYCIN 250 MG PO TBEC: 250.0000 mg | DELAYED_RELEASE_TABLET | Freq: Three times a day (TID) | ORAL | 2 refills | Status: DC

## 2021-12-18 NOTE — Telephone Encounter (Signed)
Spoke with the patient via phone call she is aware she will need to reach out to the pcp Dr. Moshe Cipro to get the K+. We will fill Carafate and Erythromycin.

## 2021-12-19 ENCOUNTER — Other Ambulatory Visit: Payer: Self-pay | Admitting: Family Medicine

## 2021-12-19 MED ORDER — POTASSIUM CHLORIDE CRYS ER 10 MEQ PO TBCR: EXTENDED_RELEASE_TABLET | ORAL | 2 refills | Status: DC

## 2021-12-26 ENCOUNTER — Telehealth: Payer: Self-pay

## 2021-12-26 NOTE — Telephone Encounter (Signed)
Transition Care Management Follow-up Telephone Call Date of discharge and from where: 12/05/2021  [facility] How have you been since you were released from the hospital? I now have covid Any questions or concerns? No  Items Reviewed: Did the pt receive and understand the discharge instructions provided? Yes  Medications obtained and verified? Yes  b Reports difficulty getting medications but has them all now Other? No  Any new allergies since your discharge? No  Dietary orders reviewed? No Do you have support at home? Yes     Functional Questionnaire: (I = Independent and D = Dependent) ADLs: I  Bathing/Dressing- I  Meal Prep- I  Eating- I  Maintaining continence- I  Transferring/Ambulation- I  Managing Meds- I  Follow up appointments reviewed:  PCP Hospital f/u appt confirmed? Yes  Scheduled to see 01/23/2022  Specialist Hospital f/u appt confirmed? Yes  Scheduled to see GI Are transportation arrangements needed? No  If their condition worsens, is the pt aware to call PCP or go to the Emergency Dept.? Yes Was the patient provided with contact information for the PCP's office or ED? Yes Was to pt encouraged to call back with questions or concerns? Yes  Tomasa Rand, RN, BSN, CEN [facility] [employer] [phone]

## 2021-12-28 ENCOUNTER — Encounter (HOSPITAL_COMMUNITY): Payer: Self-pay | Admitting: Hematology

## 2021-12-29 ENCOUNTER — Encounter (HOSPITAL_COMMUNITY): Payer: Self-pay | Admitting: Hematology

## 2021-12-30 ENCOUNTER — Encounter (INDEPENDENT_AMBULATORY_CARE_PROVIDER_SITE_OTHER): Payer: Self-pay | Admitting: Gastroenterology

## 2021-12-30 ENCOUNTER — Ambulatory Visit (INDEPENDENT_AMBULATORY_CARE_PROVIDER_SITE_OTHER): Payer: Medicare Other | Admitting: Gastroenterology

## 2021-12-30 ENCOUNTER — Other Ambulatory Visit: Payer: Self-pay

## 2021-12-30 VITALS — Ht 64.0 in | Wt 180.2 lb

## 2021-12-30 DIAGNOSIS — R197 Diarrhea, unspecified: Secondary | ICD-10-CM

## 2021-12-30 DIAGNOSIS — K3184 Gastroparesis: Secondary | ICD-10-CM | POA: Diagnosis not present

## 2021-12-30 DIAGNOSIS — K219 Gastro-esophageal reflux disease without esophagitis: Secondary | ICD-10-CM | POA: Diagnosis not present

## 2021-12-30 DIAGNOSIS — R112 Nausea with vomiting, unspecified: Secondary | ICD-10-CM

## 2021-12-30 MED ORDER — LUBIPROSTONE 24 MCG PO CAPS: 24.0000 ug | ORAL_CAPSULE | Freq: Two times a day (BID) | ORAL | 3 refills | Status: DC

## 2021-12-30 NOTE — Patient Instructions (Addendum)
Please continue your omeprazole 40mg  twice a day and sucralafate 1g with meals and at bedtime. If you are able to get the erythromycin, this would be beneficial. I am happy to attempt to talk with the insurance to see if they can get this more affordable for you as this is a medication that you need. I have sent amitiza 56mcg for you to take twice a day, this is for constipation, please take with food and water. If you try this for 2-3 weeks and find it is not giving you good results, you can stop it and go back to taking your stool softeners.  Continue to eat small portions of food and avoid overeating as well as avoiding trigger foods.  Please let me know if you develop rectal bleeding, worsening abdominal pain, vomiting that is uncontrolled, or black stools.  Follow up in 3 months

## 2021-12-30 NOTE — Progress Notes (Signed)
Primary Care Physician:  Fayrene Helper, MD  Primary GI: [facility]  Patient Location: Home   Provider Location: [place] office   Reason for Visit: hospital follow up   Persons present on the virtual encounter, with roles: [name], patient, Scherrie Gerlach, Provider   Total time (minutes) spent on medical discussion: 25 minutes  visit was conducted using virtual method.  Visit was requested by patient.  Virtual Visit via Telephone visit is conducted virtually and was requested by patient.   I connected with Donalyn Schneeberger on 12/30/21 at  3:30 PM EST by telephone and verified that I am speaking with the correct person using two identifiers.   I discussed the limitations, risks, security and privacy concerns of performing an evaluation and management service by telephone and the availability of in person appointments. I also discussed with the patient that there may be a patient responsible charge related to this service. The patient expressed understanding and agreed to proceed.  Chief Complaint  Patient presents with   Follow-up    Patient being seen today for a follow up visit from recent hospitalization on 12/01/2021 due to Hypokalemia. Patient reports she is not feeling as bad as she had been, still having abdominal pain and taking extra laxatives to prevent constipation.    History of Present Illness: Latash Nouri. Reininger is a 61 year old female with pmh of partial colectomy due to redundant colon with primary anastomosis, perforated bowel r/t gynecologic procedure complicated by enterocutaneous fistula that required multiple surgeries and multiple SBOs with recent admission for partial SBO 10/25/21, admitted again in Nov due to ongoing nausea, vomiting, diarrhea and abdominal pain with suspected pseudo-obstruction, possibly gastroparesis, and small bowel dysmotility with most recent hospitalization 12/02/21 for syncopal episode with ongoing nausea, vomiting and  abdominal distention.   She had negative stool studies during most recent admission, she underwent EGD that showed gastritis with biopsy revealing inflammation in the stomach, neg for H pylori. She was started on Erythromycin TID before meals to help with suspected motility issues. Notably she was also hypokalemic during admission with K+ 2.7, last K+ 3.3 on 12/05/21. PCP refilled her potassium and she is continued on 10 mEq daily.  Today, She states has been taking 2 laxatives in the morning, 2 at lunch time and 2 in the evenings. She is also taking gas x on a regular basis. She is trying to monitor her diet and avoid certain triggers. She is having a varying amount of BMs per day, anywhere from 2-9. Stools still remain mostly diarrhea. She states that when she had covid a few weeks ago, she reports that she was coughing so much and was passing very hard stool with intermittent diarrhea. she states that she has had some RLQ pain that began at that time, she reports that pain is stabbing in nature when it occurs, has improved some since passing the harder stool but is still having it occasionally. Does not seem to be improved with a BM.  Motegrity was not approved by her insurance and she was not able to get it. She reports that she had erythromycin initially and paid $11, though when she tried to get it refilled at the end of December and it was $132. She reports that nausea has been mostly under control and she has not had much issues with it since her last admission.Appetite is decent, she is trying to do small meals to avoid over eating and feeling very full. She is  doing a lot of fruits and veggies. Has had no melena or rectal bleeding. Hgb stable at 12.9 on 12/12/21.  She was referred to [facility] motility center, though they declined the referral. She was then referred to [facility] and is still awaiting to hear back about this.  She is doing well on omeprazole 40mg  BID and carafate 1g QID.   Past Medical  History:  Diagnosis Date   Abdominal hernia 08/14/2019   Abdominal pain 06/06/2019   Acute bronchitis 05/01/2013   Acute cystitis 06/09/2010   Qualifier: Diagnosis of  By: Cori Razor LPN, Brandi     Acute renal insufficiency 08/28/2011   Allergy    Phreesia 03/16/2021   Anemia    Phreesia 10/29/2020   Anxiety    Anxiety state 03/16/2008   Qualifier: Diagnosis of  By: Dierdre Harness     Arthritis    Phreesia 10/29/2020   Back pain with radiation 07/17/2014   Bilateral hand pain 07/27/2016   Blood transfusion without reported diagnosis    Phreesia 10/29/2020   Breast cancer ([place])    L breast- 2006   Cancer ([place])    Phreesia 10/29/2020   Carpal tunnel syndrome    Chronic constipation    Chronic pain syndrome    followed by [facility]---  back   Clotting disorder ([place])    Phreesia 10/29/2020   Depression    Dermatitis 12/15/2011   Difficult intubation    Educated about COVID-19 virus infection 05/14/2019   Fall at home 01/26/2016   Family history of adverse reaction to anesthesia    MOTHER--- PONV   Fatigue 09/17/2012   Fibromyalgia    FIBROMYALGIA 03/16/2008   Qualifier: Diagnosis of  By: Dierdre Harness     GERD (gastroesophageal reflux disease)    Headache disorder 01/16/2013   Headache syndrome 01/26/2016   Hip pain, right 07/08/2019   History of MRSA infection    lip abscess   History of ovarian cyst 06/2011   s/p  BSO   History of pulmonary embolus (PE) 1997   post EP with ablation pulmonary veouns for SVT/ Atrial Fib.   History of supraventricular tachycardia    s/p  ablation 1996  and 1997  by dr Caryl Comes   History of TIA (transient ischemic attack) 1997   post op EP ablation PE   Hyperlipidemia    Hyperlipidemia LDL goal <100 03/16/2008   Qualifier: Diagnosis of  By: Dierdre Harness     Hypothyroidism    followed by pcp   Incisional hernia    Insomnia 12/15/2011   Intermittent palpitations 08/22/2017   Interstitial cystitis    09-13-2018   per pt last flare-up  May 2019  (followed by pcp)   INTERSTITIAL CYSTITIS 02/17/2011   Qualifier: Diagnosis of  By: Moshe Cipro MD, Margaret     Iron deficiency anemia    09-13-2018  PER PT STABLE   Irregular heart rate 07/25/2013   Lipoma of back    upper   Low back pain radiating to right leg 07/04/2019   Low ferritin level 06/14/2014   MDD (major depressive disorder), single episode, in full remission ([place]) 10/27/2017   Medically noncompliant 02/28/2015   Multiple missed appointments, both follow-up appointments and lab appointments.    Metabolic syndrome X 1/61/0960   Qualifier: Diagnosis of  By: Moshe Cipro MD, Margaret  hBA1c is 5.8 in 02/2013    Migraines    Morbid obesity ([place]) 03/27/2013   Muscle spasm 01/03/2020   Nausea alone 07/17/2014  NECK PAIN, CHRONIC 03/16/2008   Qualifier: Diagnosis of  By: Dierdre Harness     Normal coronary arteries    a. by CT 12/2018.   Obesity 03/16/2008   Qualifier: Diagnosis of  By: Dierdre Harness     Oral ulceration 08/23/2014   Presented at 06/14/2014 visit    Other malaise and fatigue 03/16/2008   Centricity Description: FATIGUE, CHRONIC Qualifier: Diagnosis of  By: Dierdre Harness   Centricity Description: FATIGUE Qualifier: Diagnosis of  By: Claybon Jabs PA, Dawn     OVARIAN CYST 12/26/2009   Qualifier: Diagnosis of  By: Moshe Cipro MD, Margaret     Paget disease of breast, left [facility])    Paget's disease of breast, left ([place]) 03/16/2008   Qualifier: Diagnosis of  By: Dierdre Harness  Left diagnosed in 2006 F/h breast cancer x 15 family members   Partial small bowel obstruction ([place]) 07/04/2012   PONV (postoperative nausea and vomiting)    SEVERE   Postsurgical menopause 11/13/2011   Presence of IVC filter 06/06/2019   PSVT (paroxysmal supraventricular tachycardia) ([place])    [place]   Recurrent oral herpes simplex infection 10/13/2017   ROM (right otitis media) 01/23/2013   S/P insertion of IVC (inferior vena caval) filter 05/08/2005   [place] (non-retrievable)  /  dx 2019  a leg  of filter is protruding thru the vena cava in to right L2 vertebral body (09-13-2018  per pt having surgery to remove filter in [place])   S/P radiofrequency ablation operation for arrhythmia 1996   1996  and 1997,   SVT and Atrial Fib   Sinusitis, chronic 01/26/2016   SMALL BOWEL OBSTRUCTION, HX OF 08/07/2008   Annotation: obstruction w/ adhesions led to partial colectomy Qualifier: Diagnosis of  By: Craige Cotta     Stroke [facility])    [place] 10/29/2020, TIAs in the past   Swelling of hand 09/17/2012   Syncope    Tachycardia 02/03/2010   Qualifier: Diagnosis of  By: Via LPN, Lynn     Thyroid disease    Phreesia 10/29/2020   Ulcer 12/15/2011   Urinary frequency 07/18/2014   Vaginitis and vulvovaginitis 05/10/2013   Vitamin D deficiency 05/05/2015   Wears glasses      Past Surgical History:  Procedure Laterality Date   ABDOMINAL HYSTERECTOMY  1987   ANTERIOR CERVICAL DECOMP/DISCECTOMY FUSION  03-07-2002   dr elsner  @[facility]    C 4 -- 5   APPENDECTOMY  1980   AUGMENTATION MAMMAPLASTY Right 2006   BILATERAL SALPINGOOPHORECTOMY  07/24/2011   via Explor. Lap. w/ intraoperative perf. bowel repair   BIOPSY  05/02/2021   Procedure: BIOPSY;  Surgeon: Montez Morita, Quillian Quince, MD;  [facility];  Service: Gastroenterology;;  small bowel, mid esophagus, distal esophagus, random colon biopsies   BIOPSY  12/04/2021   Procedure: BIOPSY;  Surgeon: [name], DO;  [facility];  Service: Endoscopy;;   BREAST BIOPSY Right 2019   benign   BREAST ENHANCEMENT SURGERY Bilateral 1993   BREAST IMPLANT REMOVAL Bilateral    BREAST SURGERY N/A    Phreesia 10/29/2020   CARDIAC CATHETERIZATION  07-06-2003   dr Darnell Level brodie   normal coronaries and LVF   [place] TEST  11-18-2015   dr Caryl Comes   normal nuclear study w/ no ischemia/  normal LV function and wall motion , ef 84%   CARPAL TUNNEL RELEASE Right ?  CESAREAN SECTION  1986   CESAREAN SECTION N/A    Phreesia 10/29/2020   CHOLECYSTECTOMY N/A    Phreesia 10/29/2020   COLON SURGERY     COLONOSCOPY WITH PROPOFOL N/A 05/02/2021   Procedure: COLONOSCOPY WITH PROPOFOL;  Surgeon: Harvel Quale, MD;  [facility];  Service: Gastroenterology;  Laterality: N/A;  12:30 PM   CYSTO/  HYDRODISTENTION/  INSTILATION THERAPY  MULTIPLE   ENTEROCUTANEOUS FISTULA CLOSURE  multiple   last one 2015 with small bowel resection   ESOPHAGOGASTRODUODENOSCOPY (EGD) WITH PROPOFOL N/A 05/02/2021   Procedure: ESOPHAGOGASTRODUODENOSCOPY (EGD) WITH PROPOFOL;  Surgeon: Harvel Quale, MD;  [facility];  Service: Gastroenterology;  Laterality: N/A;   ESOPHAGOGASTRODUODENOSCOPY (EGD) WITH PROPOFOL N/A 12/04/2021   Procedure: ESOPHAGOGASTRODUODENOSCOPY (EGD) WITH PROPOFOL;  Surgeon: [name], DO;  [facility];  Service: Endoscopy;  Laterality: N/A;   EXPLORATORY LAPAROTOMY INCISIONAL VENTRAL HERNIA REPAIR / RESECTION SMALL BOWEL  11-09-2014   @[facility]    FRACTURE SURGERY N/A    Phreesia 10/29/2020   JOINT REPLACEMENT N/A    Phreesia 10/29/2020   LIPOMA EXCISION Right 09/16/2018   Procedure: EXCISION LIPOMA UPPER BACK;  Surgeon: Clovis Riley, MD;  Location: [place];  Service: General;  Laterality: Right;   MASTECTOMY Left 2006   w/ reconstruction on left (paget's disease)  and right breast augmentation   POLYPECTOMY  05/02/2021   Procedure: POLYPECTOMY;  Surgeon: Harvel Quale, MD;  [facility];  Service: Gastroenterology;;  gastric   SAVORY DILATION  05/02/2021   Procedure: Azzie Almas DILATION;  Surgeon: Harvel Quale, MD;  [facility];  Service: Gastroenterology;;   SMALL INTESTINE SURGERY N/A    Phreesia 10/29/2020   SPINE SURGERY N/A    Phreesia 10/29/2020   TOTAL COLECTOMY  08-04-2002    @[facility]    AND CHOLECYSTECTOMY  (colonic inertia)    TRANSTHORACIC ECHOCARDIOGRAM  02/04/2016   ef 60-65%,  grade 2 diastolic dysfunction/  mild MR   TUBAL LIGATION N/A    Phreesia 03/16/2021   VENA CAVA FILTER PLACEMENT  05/08/2005   @[facility]    [place] (non-retrievable)   WIDE EXCISION PERIRECTAL ABSCESSES  09-22-2005   @ [facility]     Current Meds  Medication Sig   ALPRAZolam (XANAX) 1 MG tablet Take 1 tablet (1 mg total) by mouth 2 (two) times daily.   aspirin 81 MG chewable tablet Chew 81 mg by mouth daily.   benzonatate (TESSALON) 200 MG capsule Take 1 capsule (200 mg total) by mouth 2 (two) times daily as needed for cough.   butalbital-acetaminophen-caffeine (FIORICET) 50-325-40 MG tablet Take 1 tablet by mouth as directed.   conjugated estrogens (PREMARIN) vaginal cream Place fingertip amount intravaginally 2 to 3 times per week (Patient taking differently: Place 1 mg vaginally at bedtime. Place fingertip amount intravaginally 2 to 3 times per week)   cyanocobalamin (,VITAMIN B-12,) 1000 MCG/ML injection Inject 1 mL (1,000 mcg total) into the muscle every 30 (thirty) days.   cyclobenzaprine (FLEXERIL) 10 MG tablet TAKE 1 TABLET BY MOUTH TWICE DAILY AS NEEDED FOR MUSCLE SPASMS   cycloSPORINE (RESTASIS) 0.05 % ophthalmic emulsion Place 1 drop into both eyes 2 (two) times daily as needed (dry eyes).   DULoxetine (CYMBALTA) 30 MG capsule Take 30 mg by mouth daily.   Erythromycin 250 MG TBEC Take 250 mg by mouth 3 (three) times daily before meals.   ezetimibe (ZETIA) 10 MG tablet Take 1 tablet (10 mg  total) by mouth daily.   fluticasone (FLONASE) 50 MCG/ACT nasal spray Place 2 sprays into both nostrils daily as needed for allergies.   HYDROmorphone (DILAUDID) 4 MG tablet Take 4 mg by mouth in the morning, at noon, and at bedtime.   levothyroxine (SYNTHROID) 125 MCG tablet TAKE 1 TABLET(125 MCG) BY MOUTH DAILY BEFORE BREAKFAST   magnesium oxide (MAG-OX) 400 (240 Mg) MG tablet Take 1 tablet (400 mg total) by mouth 2 (two) times daily. Please make  overdue appt with Dr. Caryl Comes before anymore refills. Thank you 1st attempt (Patient taking differently: Take 400 mg by mouth 2 (two) times daily. Please make overdue appt with Dr. Caryl Comes before anymore refills. Thank you 1st attempt)   omeprazole (PRILOSEC) 40 MG capsule Take 1 capsule (40 mg total) by mouth in the morning and at bedtime.   potassium chloride (KLOR-CON M) 10 MEQ tablet Take one tablet by mouth once daily   pravastatin (PRAVACHOL) 40 MG tablet TAKE 1 TABLET(40 MG) BY MOUTH DAILY (Patient taking differently: Take 40 mg by mouth daily. TAKE 1 TABLET(40 MG) BY MOUTH DAILY)   senna-docusate (SENOKOT-S) 8.6-50 MG per tablet Take 2 tablets by mouth 3 (three) times daily. constipation   sucralfate (CARAFATE) 1 g tablet Take 1 tablet (1 g total) by mouth 4 (four) times daily -  with meals and at bedtime.   XARELTO 20 MG TABS tablet TAKE 1 TABLET(20 MG) BY MOUTH DAILY (Patient taking differently: Take 20 mg by mouth daily with breakfast.)     Family History  Problem Relation Age of Onset   Diabetes Mother    Heart disease Mother    Hypertension Mother    Heart disease Father    Hyperlipidemia Father    Hypertension Father    Alcohol abuse Father    Colon cancer Maternal Aunt    Breast cancer Maternal Aunt    Cancer Maternal Uncle        mets   Bone cancer Maternal Grandfather        mets   Ovarian cancer Cousin 19   Breast cancer Cousin    Prostate cancer Maternal Uncle    Breast cancer Maternal Aunt    Brain cancer Maternal Aunt    Breast cancer Maternal Aunt     Social History   Socioeconomic History   Marital status: Married    Spouse name: Not on file   Number of children: Not on file   Years of education: Not on file   Highest education level: Not on file  Occupational History   Not on file  Tobacco Use   Smoking status: Never   Smokeless tobacco: Never  Vaping Use   Vaping Use: Never used  Substance and Sexual Activity   Alcohol use: No    Comment: twice  per year   Drug use: No   Sexual activity: Yes    Birth control/protection: Surgical  Other Topics Concern   Not on file  Social History Narrative   Not on file   Social Determinants of Health   Financial Resource Strain: Medium Risk   Difficulty of Paying Living Expenses: Somewhat hard  Food Insecurity: No Food Insecurity   Worried About Charity fundraiser in the Last Year: Never true   Ran Out of Food in the Last Year: Never true  Transportation Needs: No Transportation Needs   Lack of Transportation (Medical): No   Lack of Transportation (Non-Medical): No  Physical Activity: Insufficiently Active   Days of  Exercise per Week: 7 days   Minutes of Exercise per Session: 20 min  Stress: No Stress Concern Present   Feeling of Stress : Not at all  Social Connections: Moderately Isolated   Frequency of Communication with Friends and Family: More than three times a week   Frequency of Social Gatherings with Friends and Family: More than three times a week   Attends Religious Services: Never   Marine scientist or Organizations: No   Attends Archivist Meetings: Never   Marital Status: Married    Review of Systems: Gen: Denies fever, chills, anorexia. Denies fatigue, weakness, weight loss.  CV: Denies chest pain, palpitations, syncope, peripheral edema, and claudication. Resp: Denies dyspnea at rest, cough, wheezing, coughing up blood, and pleurisy. GI: see HPI Derm: Denies rash, itching, dry skin Psych: Denies depression, anxiety, memory loss, confusion. No homicidal or suicidal ideation.  Heme: Denies bruising, bleeding, and enlarged lymph nodes.  Observations/Objective: No distress. Unable to perform physical exam due to telephone encounter. No video available.   Assessment and Plan: Tenaya Hilyer. Popoca is a 61 year old female with complicated GI history who presented today via telephone encounter for hospital follow up.   GERD is well controlled on Omeprazole  40mg  BID and sucralfate 1g QID. We will continue with current regimen.   She initially got her erythromycin for gastroparesis and started it, she had good results, however, upon attempting to get refill, medication was $132. If she are able to get the erythromycin, this would be beneficial. I am happy to attempt to talk with the insurance to see if they are able to lower the tier of the medication to make it more affordable as this is what her pharmacist requested, however, I will have to look into this as a possibility of something we can do. I did encourage her to call her pharmacy again to inquire why the price was so significantly different even prior to the new year.  She was unable to get motegrity as her insurance refused to cover it at all. She is doing 6 stool softeners per day which is keeping her from being constipated, however, we discussed trying another alternative. It seems that Baker Pierini is preferred on her formulary so we will try this. I have sent amitiza 74mcg BID, should be taken with food and water, If she tries his for 2-3 weeks and finds it is not giving good results, she can stop it and go back to taking stool softeners as I want to ensure she does not get constipated given her complicated history.  Continue to eat small portions of food and avoid overeating as well as avoiding trigger foods.  Patient to let me know if she develop rectal bleeding, worsening abdominal pain, vomiting that is uncontrolled, or black stools.  We are still awaiting to hear back from [facility] referral for motility clinic, as she was denied by [facility].  Follow Up Instructions: Follow up in 3 months   I discussed the assessment and treatment plan with the patient. The patient was provided an opportunity to ask questions and all were answered. The patient agreed with the plan and demonstrated an understanding of the instructions.   The patient was advised to call back or seek an in-person evaluation if the  symptoms worsen or if the condition fails to improve as anticipated.  I provided 25 minutes of telephone time during this Telephone encounter.  Maciej Schweitzer L. Alver Sorrow, MSN, APRN, AGNP-C Adult-Gerontology Nurse Practitioner [facility]

## 2022-01-01 ENCOUNTER — Telehealth: Payer: Self-pay | Admitting: Family Medicine

## 2022-01-01 NOTE — Progress Notes (Signed)
Virtual Visit via Telephone Note [facility]  I connected with [name]  on 01/02/22 at 12:09 PM by telephone and verified that I am speaking with the correct person using two identifiers.  Location: Patient: Home Provider: [facility]   I discussed the limitations, risks, security and privacy concerns of performing an evaluation and management service by telephone and the availability of in person appointments. I also discussed with the patient that there may be a patient responsible charge related to this service. The patient expressed understanding and agreed to proceed.   REASON FOR VISIT:  Follow-up for iron deficiency anemia   CURRENT THERAPY: IV iron (last INFED on 03/28/2021)   INTERVAL HISTORY:  [name] 61 y.o. female returns for routine follow-up of her iron deficiency anemia.  She was last seen by Tarri Abernethy PA-C on 09/18/2021.  [name] has a history of iron deficiency status secondary to multiple bowel resections and malabsorption.  She also has history of Paget's disease of the left breast which was resected with mastectomy in 2006.  She also had unprovoked pulmonary embolism in 1997 and is on Xarelto.    [name] has multiple complaints at today's visit related to her high burden of chronic disease and multiple medical issues.  She continues to have chronic fatigue, with her energy at about 40%.  She admits to pica cravings for ice and blood sugar.  She complains of restless leg, headaches, dyspnea on exertion.  She has been having episodes of lightheadedness and was hospitalized for syncope in December 2022.  She also reports ongoing chest pain for the past several years, which she reports has had negative cardiac work-up.  She continues to take Xarelto due to her history of unprovoked pulmonary embolisms.  She is tolerating it well without major bleeding events.  She denies any epistaxis, hematemesis, hematochezia, or  melena.  She has not had any new unilateral leg swelling, pain, or erythema.   She denies any new breast lumps or lymphadenopathy.  No B symptoms such as fever, chills, unintentional weight loss.    OBSERVATIONS/OBJECTIVE: Review of Systems  Constitutional:  Positive for malaise/fatigue. Negative for chills, diaphoresis, fever and weight loss.  Respiratory:  Positive for cough and shortness of breath.   Cardiovascular:  Positive for chest pain and palpitations.  Gastrointestinal:  Positive for abdominal pain, constipation, nausea and vomiting. Negative for blood in stool and melena.  Neurological:  Positive for dizziness, tingling, loss of consciousness and headaches.    PHYSICAL EXAM (per limitations of virtual telephone visit): The patient is alert and oriented x 3, exhibiting adequate mentation, good mood, and ability to speak in full sentences and execute sound judgement.   ASSESSMENT & PLAN: 1.  Iron deficiency anemia: - History of iron deficiency status secondary to multiple bowel resections and malabsorption. - Previous work-up (02/13/2021) showed normal LDH.  Folic acid and I09, copper and methylmalonic acid were normal.  SPEP was negative.  CA 15-3 and TSH were also normal. - Most recent EGD (05/02/2021) showed single gastric polyp, otherwise normal - Most recent colonoscopy (05/02/2021) was limited by liquid stool in the colon, showed nonbleeding internal hemorrhoids - Patient was given IV Venofer on 02/18/2021 but had a reaction to Venofer.  She received INFeD infusion on 03/28/2021, which she tolerated after premedication was given. -No bright red blood per rectum or melena -She is symptomatic with fatigue, pica, RLS, and headaches - Most recent labs (12/12/2021): Hgb 12.9/MCV  94.5, ferritin 125, iron saturation 16% - Nutritional panel (12/12/2021): Normal B12, folate, methylmalonic acid, copper - PLAN: Although ferritin is within normal limits, iron saturation is on the low side,  suspect functional iron deficiency in the setting of malabsorption.  Recommend INFeD x1 dose with premedications. - Repeat labs and office visit in 4 months.   2.  Unprovoked pulmonary embolism: - Most recent D-dimer is negative.  No current signs or symptoms of DVT/PE. - Patient is worried that she is not digesting her medications, since she is having issues with GI motility, gastroparesis, and frequent vomiting. - We discussed that she could change from Xarelto to Lovenox, but she prefers to hold off on this for the time being.  She will discuss with her other specialists and reach out to our clinic if she decides to change the formulation of her anticoagulant. - PLAN: Continue Xarelto.  Would consider switching to Lovenox in the future if there is persistent concern for GI absorption of medications in the setting of GI motility issues and gastroparesis.  3.  History of Paget's disease of left breast - History of Paget's disease of the left breast which was resected with mastectomy in 2006. - Most recent mammogram 06/02/2021: BI-RADS Category 1 negative - She reports that she has been having some breast itching for the past month, and is planning to follow-up at her established breast center. - PLAN: Continue annual mammograms via breast center.   4.  B12 deficiency secondary to intestinal malabsorption - She has been self-administering B12 injections - PLAN: Refill for vitamin B12 injections has been sent to pharmacy.    5.  Vitamin D deficiency - She has been taking vitamin D 50,000 units weekly  - Most recent vitamin D (12/12/2021) shows vitamin D overload at 101.96 - PLAN: Recommended to change dosing to 50,000 units every other week.   FOLLOW UP INSTRUCTIONS: INFeD x1 dose Labs in 4 months Office visit after labs    I discussed the assessment and treatment plan with the patient. The patient was provided an opportunity to ask questions and all were answered. The patient agreed with  the plan and demonstrated an understanding of the instructions.   The patient was advised to call back or seek an in-person evaluation if the symptoms worsen or if the condition fails to improve as anticipated.  I provided 28 minutes of non-face-to-face time during this encounter.   Harriett Rush, PA-C 01/02/2022 3:33 PM

## 2022-01-02 ENCOUNTER — Other Ambulatory Visit: Payer: Self-pay

## 2022-01-02 ENCOUNTER — Inpatient Hospital Stay (HOSPITAL_COMMUNITY): Payer: Medicare Other | Attending: Hematology | Admitting: Physician Assistant

## 2022-01-02 DIAGNOSIS — K912 Postsurgical malabsorption, not elsewhere classified: Secondary | ICD-10-CM | POA: Diagnosis not present

## 2022-01-02 DIAGNOSIS — E559 Vitamin D deficiency, unspecified: Secondary | ICD-10-CM | POA: Insufficient documentation

## 2022-01-02 DIAGNOSIS — Z79899 Other long term (current) drug therapy: Secondary | ICD-10-CM | POA: Insufficient documentation

## 2022-01-02 DIAGNOSIS — E538 Deficiency of other specified B group vitamins: Secondary | ICD-10-CM | POA: Diagnosis not present

## 2022-01-02 DIAGNOSIS — F5089 Other specified eating disorder: Secondary | ICD-10-CM | POA: Insufficient documentation

## 2022-01-02 DIAGNOSIS — R109 Unspecified abdominal pain: Secondary | ICD-10-CM | POA: Insufficient documentation

## 2022-01-02 DIAGNOSIS — R5383 Other fatigue: Secondary | ICD-10-CM | POA: Insufficient documentation

## 2022-01-02 DIAGNOSIS — D509 Iron deficiency anemia, unspecified: Secondary | ICD-10-CM | POA: Insufficient documentation

## 2022-01-02 DIAGNOSIS — R0602 Shortness of breath: Secondary | ICD-10-CM | POA: Insufficient documentation

## 2022-01-02 DIAGNOSIS — R079 Chest pain, unspecified: Secondary | ICD-10-CM | POA: Insufficient documentation

## 2022-01-02 DIAGNOSIS — R002 Palpitations: Secondary | ICD-10-CM | POA: Insufficient documentation

## 2022-01-02 DIAGNOSIS — R55 Syncope and collapse: Secondary | ICD-10-CM | POA: Insufficient documentation

## 2022-01-02 DIAGNOSIS — K909 Intestinal malabsorption, unspecified: Secondary | ICD-10-CM | POA: Insufficient documentation

## 2022-01-02 DIAGNOSIS — R42 Dizziness and giddiness: Secondary | ICD-10-CM | POA: Insufficient documentation

## 2022-01-02 DIAGNOSIS — R059 Cough, unspecified: Secondary | ICD-10-CM | POA: Insufficient documentation

## 2022-01-07 ENCOUNTER — Telehealth (INDEPENDENT_AMBULATORY_CARE_PROVIDER_SITE_OTHER): Payer: Self-pay

## 2022-01-07 ENCOUNTER — Encounter (INDEPENDENT_AMBULATORY_CARE_PROVIDER_SITE_OTHER): Payer: Self-pay | Admitting: Gastroenterology

## 2022-01-07 NOTE — Telephone Encounter (Signed)
Patient called today stating she was denied by [facility] and [facility],due to they are not taking new patient and they feel they could not help her anymore than we can. She would like to be referred to a Dr. Shirleen Schirmer with [facility]. She states they can preform Botox for the stomach and esophagus. They also have a device "kinda like a pacemaker for the stomach", patient says that is to help with Gastroparesis. Please advise.

## 2022-01-07 NOTE — Telephone Encounter (Signed)
Patient called back and she has apt with Dr Harl Bowie 02/12/22

## 2022-01-07 NOTE — Telephone Encounter (Signed)
Hi Ann, Please see the message below.  Please check with [facility] if they denied her as a patient.  If so, we can refer her to Dr. Shirleen Schirmer per her request.  Vikki Ports, we may need to confirm if indeed she has gastroparesis with a gastric emptying study while off opiates and erythromycin for a week prior to the study

## 2022-01-07 NOTE — Telephone Encounter (Signed)
Yes please

## 2022-01-08 ENCOUNTER — Other Ambulatory Visit: Payer: Self-pay

## 2022-01-08 ENCOUNTER — Other Ambulatory Visit: Payer: Self-pay | Admitting: Family Medicine

## 2022-01-08 ENCOUNTER — Inpatient Hospital Stay (HOSPITAL_COMMUNITY): Payer: Medicare Other

## 2022-01-08 VITALS — BP 114/56 | HR 101 | Temp 97.8°F | Resp 18 | Ht 64.0 in | Wt 193.2 lb

## 2022-01-08 DIAGNOSIS — Z79899 Other long term (current) drug therapy: Secondary | ICD-10-CM | POA: Diagnosis not present

## 2022-01-08 DIAGNOSIS — R5383 Other fatigue: Secondary | ICD-10-CM | POA: Diagnosis not present

## 2022-01-08 DIAGNOSIS — R002 Palpitations: Secondary | ICD-10-CM | POA: Diagnosis not present

## 2022-01-08 DIAGNOSIS — E559 Vitamin D deficiency, unspecified: Secondary | ICD-10-CM | POA: Diagnosis not present

## 2022-01-08 DIAGNOSIS — D509 Iron deficiency anemia, unspecified: Secondary | ICD-10-CM | POA: Diagnosis not present

## 2022-01-08 DIAGNOSIS — F5089 Other specified eating disorder: Secondary | ICD-10-CM | POA: Diagnosis not present

## 2022-01-08 DIAGNOSIS — R55 Syncope and collapse: Secondary | ICD-10-CM | POA: Diagnosis not present

## 2022-01-08 DIAGNOSIS — D500 Iron deficiency anemia secondary to blood loss (chronic): Secondary | ICD-10-CM

## 2022-01-08 DIAGNOSIS — R059 Cough, unspecified: Secondary | ICD-10-CM | POA: Diagnosis not present

## 2022-01-08 DIAGNOSIS — R0602 Shortness of breath: Secondary | ICD-10-CM | POA: Diagnosis not present

## 2022-01-08 DIAGNOSIS — R109 Unspecified abdominal pain: Secondary | ICD-10-CM | POA: Diagnosis not present

## 2022-01-08 DIAGNOSIS — E538 Deficiency of other specified B group vitamins: Secondary | ICD-10-CM | POA: Diagnosis not present

## 2022-01-08 DIAGNOSIS — R790 Abnormal level of blood mineral: Secondary | ICD-10-CM

## 2022-01-08 DIAGNOSIS — K909 Intestinal malabsorption, unspecified: Secondary | ICD-10-CM | POA: Diagnosis not present

## 2022-01-08 DIAGNOSIS — R079 Chest pain, unspecified: Secondary | ICD-10-CM | POA: Diagnosis not present

## 2022-01-08 DIAGNOSIS — R42 Dizziness and giddiness: Secondary | ICD-10-CM | POA: Diagnosis not present

## 2022-01-08 MED ORDER — SODIUM CHLORIDE 0.9 % IV SOLN: Freq: Once | INTRAVENOUS | Status: AC

## 2022-01-08 MED ORDER — METHYLPREDNISOLONE SODIUM SUCC 125 MG IJ SOLR
125.0000 mg | Freq: Once | INTRAMUSCULAR | Status: AC
  Administered 2022-01-08: 125 mg via INTRAVENOUS
  Filled 2022-01-08: qty 2

## 2022-01-08 MED ORDER — DIPHENHYDRAMINE HCL 25 MG PO CAPS: 50.0000 mg | ORAL_CAPSULE | Freq: Once | ORAL | Status: DC

## 2022-01-08 MED ORDER — LORATADINE 10 MG PO TABS
10.0000 mg | ORAL_TABLET | Freq: Once | ORAL | Status: AC
  Administered 2022-01-08: 10 mg via ORAL
  Filled 2022-01-08: qty 1

## 2022-01-08 MED ORDER — SODIUM CHLORIDE 0.9 % IV SOLN
50.0000 mg | Freq: Once | INTRAVENOUS | Status: AC
  Administered 2022-01-08: 50 mg via INTRAVENOUS
  Filled 2022-01-08: qty 1

## 2022-01-08 MED ORDER — SODIUM CHLORIDE 0.9 % IV SOLN
950.0000 mg | Freq: Once | INTRAVENOUS | Status: AC
  Administered 2022-01-08: 950 mg via INTRAVENOUS
  Filled 2022-01-08: qty 19

## 2022-01-08 MED ORDER — FAMOTIDINE IN NACL 20-0.9 MG/50ML-% IV SOLN
20.0000 mg | Freq: Once | INTRAVENOUS | Status: AC
  Administered 2022-01-08: 20 mg via INTRAVENOUS
  Filled 2022-01-08: qty 50

## 2022-01-08 MED ORDER — ACETAMINOPHEN 325 MG PO TABS
650.0000 mg | ORAL_TABLET | Freq: Once | ORAL | Status: AC
  Administered 2022-01-08: 650 mg via ORAL
  Filled 2022-01-08: qty 2

## 2022-01-08 MED ORDER — ACETAMINOPHEN 325 MG PO TABS: 650.0000 mg | ORAL_TABLET | Freq: Once | ORAL | Status: DC

## 2022-01-08 NOTE — Progress Notes (Signed)
Pt presents today for INFED IV iron infusion per provider's order. Vital signs stable and pt voiced no new complaints at this time.  Peripheral IV started with good blood return pre and post infusion.  1004 50mg   INFED dose administered.  1156 950 mg INFED administered. Pt denies any side effects at this time.   INFED 50mg  and 950 mg given today per MD orders. Tolerated infusion without adverse affects. Vital signs stable. No complaints at this time. Discharged from clinic ambulatory in stable condition. Alert and oriented x 3. F/U with [facility] as scheduled.

## 2022-01-08 NOTE — Patient Instructions (Signed)
[facility]  Discharge Instructions: Thank you for choosing [facility] to provide your oncology and hematology care.  If you have a lab appointment with the [facility], please come in thru the Main Entrance and check in at the main information desk.  Wear comfortable clothing and clothing appropriate for easy access to any Portacath or PICC line.   We strive to give you quality time with your provider. You may need to reschedule your appointment if you arrive late (15 or more minutes).  Arriving late affects you and other patients whose appointments are after yours.  Also, if you miss three or more appointments without notifying the office, you may be dismissed from the clinic at the providers discretion.      For prescription refill requests, have your pharmacy contact our office and allow 72 hours for refills to be completed.    Today you received the following chemotherapy and/or immunotherapy agents INFED iron infusion. To help prevent nausea and vomiting after your treatment, we encourage you to take your nausea medication as directed.  BELOW ARE SYMPTOMS THAT SHOULD BE REPORTED IMMEDIATELY: *FEVER GREATER THAN 100.4 F (38 C) OR HIGHER *CHILLS OR SWEATING *NAUSEA AND VOMITING THAT IS NOT CONTROLLED WITH YOUR NAUSEA MEDICATION *UNUSUAL SHORTNESS OF BREATH *UNUSUAL BRUISING OR BLEEDING *URINARY PROBLEMS (pain or burning when urinating, or frequent urination) *BOWEL PROBLEMS (unusual diarrhea, constipation, pain near the anus) TENDERNESS IN MOUTH AND THROAT WITH OR WITHOUT PRESENCE OF ULCERS (sore throat, sores in mouth, or a toothache) UNUSUAL RASH, SWELLING OR PAIN  UNUSUAL VAGINAL DISCHARGE OR ITCHING   Items with * indicate a potential emergency and should be followed up as soon as possible or go to the Emergency Department if any problems should occur.  Please show the CHEMOTHERAPY ALERT CARD or IMMUNOTHERAPY ALERT CARD at check-in to the Emergency  Department and triage nurse.  Should you have questions after your visit or need to cancel or reschedule your appointment, please contact [facility] [phone]  and follow the prompts.  Office hours are 8:00 a.m. to 4:30 p.m. Monday - Friday. Please note that voicemails left after 4:00 p.m. may not be returned until the following business day.  We are closed weekends and major holidays. You have access to a nurse at all times for urgent questions. Please call the main number to the clinic [phone] and follow the prompts.  For any non-urgent questions, you may also contact your provider using MyChart. We now offer e-Visits for anyone 68 and older to request care online for non-urgent symptoms. For details visit [email or web address].   Also download the MyChart app! Go to the app store, search "MyChart", open the app, select [facility], and log in with your MyChart username and password.  Due to Covid, a mask is required upon entering the hospital/clinic. If you do not have a mask, one will be given to you upon arrival. For doctor visits, patients may have 1 support person aged 105 or older with them. For treatment visits, patients cannot have anyone with them due to current Covid guidelines and our immunocompromised population.

## 2022-01-09 DIAGNOSIS — G4489 Other headache syndrome: Secondary | ICD-10-CM | POA: Diagnosis not present

## 2022-01-09 DIAGNOSIS — M545 Low back pain, unspecified: Secondary | ICD-10-CM | POA: Diagnosis not present

## 2022-01-09 DIAGNOSIS — F458 Other somatoform disorders: Secondary | ICD-10-CM | POA: Diagnosis not present

## 2022-01-09 DIAGNOSIS — G43909 Migraine, unspecified, not intractable, without status migrainosus: Secondary | ICD-10-CM | POA: Diagnosis not present

## 2022-01-13 ENCOUNTER — Telehealth (INDEPENDENT_AMBULATORY_CARE_PROVIDER_SITE_OTHER): Payer: Self-pay | Admitting: Gastroenterology

## 2022-01-13 ENCOUNTER — Telehealth: Payer: Self-pay | Admitting: Family Medicine

## 2022-01-13 DIAGNOSIS — R112 Nausea with vomiting, unspecified: Secondary | ICD-10-CM

## 2022-01-13 NOTE — Telephone Encounter (Signed)
Official Chief Operating Officer   Received Noted Sleeved

## 2022-01-14 ENCOUNTER — Other Ambulatory Visit (INDEPENDENT_AMBULATORY_CARE_PROVIDER_SITE_OTHER): Payer: Self-pay | Admitting: Gastroenterology

## 2022-01-14 NOTE — Telephone Encounter (Signed)
Last seen 12/30/2021 by Vikki Ports.

## 2022-01-15 ENCOUNTER — Ambulatory Visit (HOSPITAL_COMMUNITY): Payer: Medicare Other

## 2022-01-15 NOTE — Telephone Encounter (Signed)
Patient called asked if we would fax her jury duty papers and mail them. Patient said she will probably end up in the hospital and can not do the jury duty she said she has a bowel blockage.

## 2022-01-15 NOTE — Telephone Encounter (Signed)
Letter put to be signed by dr

## 2022-01-16 NOTE — Telephone Encounter (Signed)
Forms faxed and mailed. Patient informed.

## 2022-01-18 ENCOUNTER — Encounter: Payer: Self-pay | Admitting: Internal Medicine

## 2022-01-19 ENCOUNTER — Telehealth: Payer: Self-pay | Admitting: Internal Medicine

## 2022-01-19 NOTE — Telephone Encounter (Signed)
Called patient to schedule appt with [facility] and address symptoms. Patient did not answer and a message was left for the patient to call back to discuss symptoms. Patient was also sent a MyChart message     Patient had following MyChart conversation with Dr. Caryl Comes and his RN:    Good Morning,   I am so sorry to hear about your mother.  Thank you for reaching out.  I will have a scheduler contact you to get you scheduled.  Dr Caryl Comes specializes in the rhythm of the heart but you have seen Melina Copa in our office before for chest pain.  She is with our general cardiology department.  If you develop active chest pain prior to being seen please do not wait and go to the ED for further evaluation.   Thank You,   Rosann Auerbach R,RN  This MyChart message has not been read.   Jeanann Lewandowsky, [place] routed conversation to Thora Lance, RN 2 hours ago (7:00 AM)   [facility] Triage (supporting Deboraha Sprang, MD) 12 hours ago (9:32 PM)   Im having problems with my heart. Chest pain continues to get worse, hearts acting up. GI drs ruled out my esophagus spasming. They mentioned I may need a calf to rule out a blockage and I and I agree. The pain is really different than what Ive experience. Looking for help as soon as possible. I also wanted to let Dr Jens Som now that my mother ,is patient, Ignacia Marvel, 1943-06-13 passed away recently. Part problems run on both side of my family. My dad just had a heart attack and died and now my mother in gone.   Thank you, sincerely, ALMYRA BIRMAN [phone] Date of birth, 06-28-1961

## 2022-01-19 NOTE — Telephone Encounter (Signed)
Does a gen card app have any appts soon?

## 2022-01-19 NOTE — Progress Notes (Deleted)
Cardiology Office Note:    Date:  01/19/2022   ID:  [name], DOB 29-Oct-1961, MRN [id number]  PCP:  Fayrene Helper, MD   [facility] Providers Cardiologist:  Virl Axe, MD { Click to update primary MD,subspecialty MD or APP then REFRESH:1}    Referring MD: Fayrene Helper, MD   CC: DOD CP  History of Present Illness:    [name] is a 61 y.o. female with a hx of HTN and family history of CAD presenting with new chest pain.  Prior SVT ablation. Prior [place] 2004 (Dr. Rockne Menghini).  Patient notes that she is doing ***.   Since day prior/last visit notes *** . There are no*** interval hospital/ED visit.    No chest pain or pressure ***.  No SOB/DOE*** and no PND/Orthopnea***.  No weight gain or leg swelling***.  No palpitations or syncope ***.  Ambulatory blood pressure ***.   Past Medical History:  Diagnosis Date   Abdominal hernia 08/14/2019   Abdominal pain 06/06/2019   Acute bronchitis 05/01/2013   Acute cystitis 06/09/2010   Qualifier: Diagnosis of  By: Cori Razor LPN, Brandi     Acute renal insufficiency 08/28/2011   Allergy    Phreesia 03/16/2021   Anemia    Phreesia 10/29/2020   Anxiety    Anxiety state 03/16/2008   Qualifier: Diagnosis of  By: Dierdre Harness     Arthritis    Phreesia 10/29/2020   Back pain with radiation 07/17/2014   Bilateral hand pain 07/27/2016   Blood transfusion without reported diagnosis    Phreesia 10/29/2020   Breast cancer ([place])    L breast- 2006   Cancer ([place])    Phreesia 10/29/2020   Carpal tunnel syndrome    Chronic constipation    Chronic pain syndrome    followed by [facility]---  back   Clotting disorder ([place])    Phreesia 10/29/2020   Depression    Dermatitis 12/15/2011   Difficult intubation    Educated about COVID-19 virus infection 05/14/2019   Fall at home 01/26/2016   Family history of adverse reaction to anesthesia    MOTHER--- PONV   Fatigue 09/17/2012   Fibromyalgia     FIBROMYALGIA 03/16/2008   Qualifier: Diagnosis of  By: Dierdre Harness     GERD (gastroesophageal reflux disease)    Headache disorder 01/16/2013   Headache syndrome 01/26/2016   Hip pain, right 07/08/2019   History of MRSA infection    lip abscess   History of ovarian cyst 06/2011   s/p  BSO   History of pulmonary embolus (PE) 1997   post EP with ablation pulmonary veouns for SVT/ Atrial Fib.   History of supraventricular tachycardia    s/p  ablation 1996  and 1997  by dr Caryl Comes   History of TIA (transient ischemic attack) 1997   post op EP ablation PE   Hyperlipidemia    Hyperlipidemia LDL goal <100 03/16/2008   Qualifier: Diagnosis of  By: Dierdre Harness     Hypothyroidism    followed by pcp   Incisional hernia    Insomnia 12/15/2011   Intermittent palpitations 08/22/2017   Interstitial cystitis    09-13-2018   per pt last flare-up  May 2019 (followed by pcp)   INTERSTITIAL CYSTITIS 02/17/2011   Qualifier: Diagnosis of  By: Moshe Cipro MD, Margaret     Iron deficiency anemia    09-13-2018  PER PT STABLE   Irregular heart rate 07/25/2013  Lipoma of back    upper   Low back pain radiating to right leg 07/04/2019   Low ferritin level 06/14/2014   MDD (major depressive disorder), single episode, in full remission ([place]) 10/27/2017   Medically noncompliant 02/28/2015   Multiple missed appointments, both follow-up appointments and lab appointments.    Metabolic syndrome X 06/12/736   Qualifier: Diagnosis of  By: Moshe Cipro MD, Margaret  hBA1c is 5.8 in 02/2013    Migraines    Morbid obesity ([place]) 03/27/2013   Muscle spasm 01/03/2020   Nausea alone 07/17/2014   NECK PAIN, CHRONIC 03/16/2008   Qualifier: Diagnosis of  By: Dierdre Harness     Normal coronary arteries    a. by CT 12/2018.   Obesity 03/16/2008   Qualifier: Diagnosis of  By: Dierdre Harness     Oral ulceration 08/23/2014   Presented at 06/14/2014 visit    Other malaise and fatigue 03/16/2008   Centricity Description: FATIGUE, CHRONIC  Qualifier: Diagnosis of  By: Dierdre Harness   Centricity Description: FATIGUE Qualifier: Diagnosis of  By: Claybon Jabs PA, Dawn     OVARIAN CYST 12/26/2009   Qualifier: Diagnosis of  By: Moshe Cipro MD, Margaret     Paget disease of breast, left [facility])    Paget's disease of breast, left ([place]) 03/16/2008   Qualifier: Diagnosis of  By: Dierdre Harness  Left diagnosed in 2006 F/h breast cancer x 15 family members   Partial small bowel obstruction ([place]) 07/04/2012   PONV (postoperative nausea and vomiting)    SEVERE   Postsurgical menopause 11/13/2011   Presence of IVC filter 06/06/2019   PSVT (paroxysmal supraventricular tachycardia) ([place])    [place]   Recurrent oral herpes simplex infection 10/13/2017   ROM (right otitis media) 01/23/2013   S/P insertion of IVC (inferior vena caval) filter 05/08/2005   [place] (non-retrievable)  /  dx 2019  a leg of filter is protruding thru the vena cava in to right L2 vertebral body (09-13-2018  per pt having surgery to remove filter in [place])   S/P radiofrequency ablation operation for arrhythmia 1996   1996  and 1997,   SVT and Atrial Fib   Sinusitis, chronic 01/26/2016   SMALL BOWEL OBSTRUCTION, HX OF 08/07/2008   Annotation: obstruction w/ adhesions led to partial colectomy Qualifier: Diagnosis of  By: Craige Cotta     Stroke [facility])    [place] 10/29/2020, TIAs in the past   Swelling of hand 09/17/2012   Syncope    Tachycardia 02/03/2010   Qualifier: Diagnosis of  By: Via LPN, Lynn     Thyroid disease    Phreesia 10/29/2020   Ulcer 12/15/2011   Urinary frequency 07/18/2014   Vaginitis and vulvovaginitis 05/10/2013   Vitamin D deficiency 05/05/2015   Wears glasses     Past Surgical History:  Procedure Laterality Date   ABDOMINAL HYSTERECTOMY  1987   ANTERIOR CERVICAL DECOMP/DISCECTOMY FUSION  03-07-2002   dr elsner  @[facility]    C 4 -- 5   APPENDECTOMY  1980   AUGMENTATION MAMMAPLASTY Right 2006   BILATERAL SALPINGOOPHORECTOMY  07/24/2011    via Explor. Lap. w/ intraoperative perf. bowel repair   BIOPSY  05/02/2021   Procedure: BIOPSY;  Surgeon: Montez Morita, Quillian Quince, MD;  [facility];  Service: Gastroenterology;;  small bowel, mid esophagus, distal esophagus, random colon biopsies   BIOPSY  12/04/2021   Procedure: BIOPSY;  Surgeon: [name], DO;  [facility];  Service: Endoscopy;;  BREAST BIOPSY Right 2019   benign   BREAST ENHANCEMENT SURGERY Bilateral 1993   BREAST IMPLANT REMOVAL Bilateral    BREAST SURGERY N/A    Phreesia 10/29/2020   CARDIAC CATHETERIZATION  07-06-2003   dr Darnell Level brodie   normal coronaries and LVF   CARDIAC ELECTROPHYSIOLOGY STUDY AND ABLATION  1996  and 1997   CARDIOVASCULAR STRESS TEST  11-18-2015   dr Caryl Comes   normal nuclear study w/ no ischemia/  normal LV function and wall motion , ef 84%   CARPAL TUNNEL RELEASE Right ?   CESAREAN SECTION  1986   CESAREAN SECTION N/A    Phreesia 10/29/2020   CHOLECYSTECTOMY N/A    Phreesia 10/29/2020   COLON SURGERY     COLONOSCOPY WITH PROPOFOL N/A 05/02/2021   Procedure: COLONOSCOPY WITH PROPOFOL;  Surgeon: Harvel Quale, MD;  [facility];  Service: Gastroenterology;  Laterality: N/A;  12:30 PM   CYSTO/  HYDRODISTENTION/  INSTILATION THERAPY  MULTIPLE   ENTEROCUTANEOUS FISTULA CLOSURE  multiple   last one 2015 with small bowel resection   ESOPHAGOGASTRODUODENOSCOPY (EGD) WITH PROPOFOL N/A 05/02/2021   Procedure: ESOPHAGOGASTRODUODENOSCOPY (EGD) WITH PROPOFOL;  Surgeon: Harvel Quale, MD;  [facility];  Service: Gastroenterology;  Laterality: N/A;   ESOPHAGOGASTRODUODENOSCOPY (EGD) WITH PROPOFOL N/A 12/04/2021   Procedure: ESOPHAGOGASTRODUODENOSCOPY (EGD) WITH PROPOFOL;  Surgeon: [name], DO;  [facility];  Service: Endoscopy;  Laterality: N/A;   EXPLORATORY LAPAROTOMY INCISIONAL VENTRAL HERNIA REPAIR / RESECTION SMALL BOWEL  11-09-2014   @[facility]    FRACTURE  SURGERY N/A    Phreesia 10/29/2020   JOINT REPLACEMENT N/A    Phreesia 10/29/2020   LIPOMA EXCISION Right 09/16/2018   Procedure: EXCISION LIPOMA UPPER BACK;  Surgeon: Clovis Riley, MD;  Location: [place];  Service: General;  Laterality: Right;   MASTECTOMY Left 2006   w/ reconstruction on left (paget's disease)  and right breast augmentation   POLYPECTOMY  05/02/2021   Procedure: POLYPECTOMY;  Surgeon: Harvel Quale, MD;  [facility];  Service: Gastroenterology;;  gastric   SAVORY DILATION  05/02/2021   Procedure: Azzie Almas DILATION;  Surgeon: Harvel Quale, MD;  [facility];  Service: Gastroenterology;;   SMALL INTESTINE SURGERY N/A    Phreesia 10/29/2020   SPINE SURGERY N/A    Phreesia 10/29/2020   TOTAL COLECTOMY  08-04-2002    @[facility]    AND CHOLECYSTECTOMY  (colonic inertia)   TRANSTHORACIC ECHOCARDIOGRAM  02/04/2016   ef 60-65%,  grade 2 diastolic dysfunction/  mild MR   TUBAL LIGATION N/A    Phreesia 03/16/2021   VENA CAVA FILTER PLACEMENT  05/08/2005   @[facility]    [place] (non-retrievable)   WIDE EXCISION PERIRECTAL ABSCESSES  09-22-2005   @ [facility]    Current Medications: No outpatient medications have been marked as taking for the 01/21/22 encounter (Appointment) with Werner Lean, MD.     Allergies:   Nitrofurantoin, Crestor [rosuvastatin calcium], Bisacodyl, Clarithromycin, Clarithromycin, Clindamycin hcl, Codeine, Iron sucrose, Monosodium glutamate, and Scopolamine hbr   Social History   Socioeconomic History   Marital status: Married    Spouse name: Not on file   Number of children: Not on file   Years of education: Not on file   Highest education level: Not on file  Occupational History   Not on file  Tobacco Use   Smoking status: Never   Smokeless tobacco: Never  Vaping Use   Vaping Use:  Never used  Substance and Sexual Activity   Alcohol use: No    Comment: twice per year   Drug  use: No   Sexual activity: Yes    Birth control/protection: Surgical  Other Topics Concern   Not on file  Social History Narrative   Not on file   Social Determinants of Health   Financial Resource Strain: Medium Risk   Difficulty of Paying Living Expenses: Somewhat hard  Food Insecurity: No Food Insecurity   Worried About Charity fundraiser in the Last Year: Never true   Ran Out of Food in the Last Year: Never true  Transportation Needs: No Transportation Needs   Lack of Transportation (Medical): No   Lack of Transportation (Non-Medical): No  Physical Activity: Insufficiently Active   Days of Exercise per Week: 7 days   Minutes of Exercise per Session: 20 min  Stress: No Stress Concern Present   Feeling of Stress : Not at all  Social Connections: Moderately Isolated   Frequency of Communication with Friends and Family: More than three times a week   Frequency of Social Gatherings with Friends and Family: More than three times a week   Attends Religious Services: Never   Marine scientist or Organizations: No   Attends Music therapist: Never   Marital Status: Married     Family History: [name] ***family history includes Alcohol abuse in her father; Bone cancer in her maternal grandfather; Brain cancer in her maternal aunt; Breast cancer in her cousin, maternal aunt, maternal aunt, and maternal aunt; Cancer in her maternal uncle; Colon cancer in her maternal aunt; Diabetes in her mother; Heart disease in her father and mother; Hyperlipidemia in her father; Hypertension in her father and mother; Ovarian cancer (age of onset: 12) in her cousin; Prostate cancer in her maternal uncle.  ROS:   Please see the history of present illness.    *** All other systems reviewed and are negative.  EKGs/Labs/Other Studies Reviewed:    The following studies were reviewed today: ***  EKG:  EKG is *** ordered today.  The ekg ordered today demonstrates ***  Recent  Labs: 09/11/2021: TSH 2.101 12/04/2021: ALT 24 12/12/2021: BUN 21; Creatinine, Ser 0.79; Hemoglobin 12.9; Magnesium 1.4; Platelets 198; Potassium 3.5; Sodium 139  Recent Lipid Panel    Component Value Date/Time   CHOL 127 09/11/2021 1401   CHOL 146 01/15/2021 1540   TRIG 278 (H) 09/11/2021 1401   HDL 37 (L) 09/11/2021 1401   HDL 42 01/15/2021 1540   CHOLHDL 3.4 09/11/2021 1401   VLDL 56 (H) 09/11/2021 1401   LDLCALC 34 09/11/2021 1401   LDLCALC 60 01/15/2021 1540   LDLDIRECT 138 (H) 09/15/2012 1540     Risk Assessment/Calculations:   {Does this patient have ATRIAL FIBRILLATION?:[phone]}       Physical Exam:    VS:  There were no vitals taken for this visit.    Wt Readings from Last 3 Encounters:  01/08/22 87.6 kg  12/30/21 81.7 kg  12/12/21 85.7 kg     Gen: *** distress, *** obese/well nourished/malnourished   Neck: No JVD, *** carotid bruit Ears: Pilar Plate Sign Cardiac: No Rubs or Gallops, *** Murmur, ***cardia, *** radial pulses Respiratory: Clear to auscultation bilaterally, *** effort, ***  respiratory rate GI: Soft, nontender, non-distended *** MS: No *** edema; *** moves all extremities Integument: Skin feels *** Neuro:  At time of evaluation, alert and oriented to person/place/time/situation *** Psych: Normal affect,  patient feels ***   ASSESSMENT:    No diagnosis found. PLAN:    Precordial Pain - The patient presents with cardiac/possibly cardiac/non-cardiac pain *** - *** Criteria to defer EKG  stress include without evidence of accessory pathway, ventricular pacing, digoxin use, LBBB, or baseline ST changes.  The ASCVD Risk score (Arnett DK, et al., 2019) failed to calculate for the following reasons:   The valid total cholesterol range is 130 to 320 mg/dL  - Additional Blood Work:  Lipids ***  - Continue ASA 81 mg QD, current statin, and beta blocker therapies ***  - Sublingual nitroglycerin as need for chest pain. *** - Would recommend an  echocardiogram to assess LVEF and exclude WMA.   - Would recommend CCTA with possible FFR as needed to exclude obstructive CAD and to assess for non-obstructive CAD requiring secondary prevention - BMI in *** so high tube current may be necessary, *** no hx of AF ivabradine - resting heart rate is ***50-60 bpm, given BP room with ddd Metoprolol 25 mg PO 90 min prior to scan - resting heart rate is ***60-65 bpm, given BP room with add Metoprolol  50 mg PO 90 min prior to scan - resting heart rate is ***65-80 bpm, given BP room with add Metoprolol  100 mg PO 90 min prior to scan - - resting heart rate is ***> 80bpm, given BP room with  add ivabradine 15 mg PO 120 min prior to scan   - GFR is *** necessitating contrast limit of ***  - Would recommend exercise/pharmacological ***nuclear medicine stress test (NPO at midnight/hold beta blocker in AM); discussed risks, benefits, and alternatives of the diagnostic procedure including chest pain, arrhythmia, and death.  Patient amenable for testing.       {Are you ordering a CV Procedure (e.g. stress test, cath, DCCV, TEE, etc)?   Press F2        :[id number]}    Medication Adjustments/Labs and Tests Ordered: Current medicines are reviewed at length with the patient today.  Concerns regarding medicines are outlined above.  No orders of the defined types were placed in this encounter.  No orders of the defined types were placed in this encounter.   There are no Patient Instructions on file for this visit.   Signed, Werner Lean, MD  01/19/2022 3:21 PM    [place]

## 2022-01-20 NOTE — Telephone Encounter (Signed)
Pt is scheduled with Dr Gasper Sells  on 01/21/2022.

## 2022-01-21 ENCOUNTER — Ambulatory Visit: Payer: Medicare Other | Admitting: Internal Medicine

## 2022-01-22 DIAGNOSIS — K439 Ventral hernia without obstruction or gangrene: Secondary | ICD-10-CM | POA: Diagnosis not present

## 2022-01-23 ENCOUNTER — Ambulatory Visit: Payer: Medicare Other | Admitting: Family Medicine

## 2022-01-23 ENCOUNTER — Ambulatory Visit (HOSPITAL_COMMUNITY): Admission: RE | Admit: 2022-01-23 | Payer: Medicare Other | Source: Ambulatory Visit

## 2022-01-23 ENCOUNTER — Ambulatory Visit (HOSPITAL_COMMUNITY): Payer: Medicare Other

## 2022-01-24 NOTE — Telephone Encounter (Signed)
GES scheduled

## 2022-01-28 DIAGNOSIS — H5213 Myopia, bilateral: Secondary | ICD-10-CM | POA: Diagnosis not present

## 2022-02-02 ENCOUNTER — Encounter (INDEPENDENT_AMBULATORY_CARE_PROVIDER_SITE_OTHER): Payer: Self-pay

## 2022-02-05 DIAGNOSIS — G43909 Migraine, unspecified, not intractable, without status migrainosus: Secondary | ICD-10-CM | POA: Diagnosis not present

## 2022-02-05 DIAGNOSIS — R52 Pain, unspecified: Secondary | ICD-10-CM | POA: Diagnosis not present

## 2022-02-05 DIAGNOSIS — F458 Other somatoform disorders: Secondary | ICD-10-CM | POA: Diagnosis not present

## 2022-02-05 DIAGNOSIS — M545 Low back pain, unspecified: Secondary | ICD-10-CM | POA: Diagnosis not present

## 2022-02-09 ENCOUNTER — Ambulatory Visit (HOSPITAL_COMMUNITY): Payer: Medicare Other

## 2022-02-09 ENCOUNTER — Encounter (HOSPITAL_COMMUNITY): Payer: Self-pay

## 2022-02-10 ENCOUNTER — Ambulatory Visit (INDEPENDENT_AMBULATORY_CARE_PROVIDER_SITE_OTHER): Payer: Medicare Other

## 2022-02-10 ENCOUNTER — Encounter (INDEPENDENT_AMBULATORY_CARE_PROVIDER_SITE_OTHER): Payer: Self-pay | Admitting: Gastroenterology

## 2022-02-10 ENCOUNTER — Ambulatory Visit: Payer: Medicare Other | Admitting: Internal Medicine

## 2022-02-10 ENCOUNTER — Encounter: Payer: Self-pay | Admitting: Internal Medicine

## 2022-02-10 ENCOUNTER — Other Ambulatory Visit: Payer: Self-pay

## 2022-02-10 VITALS — BP 144/84 | HR 92 | Ht 64.5 in | Wt 194.0 lb

## 2022-02-10 DIAGNOSIS — R079 Chest pain, unspecified: Secondary | ICD-10-CM | POA: Diagnosis not present

## 2022-02-10 DIAGNOSIS — R002 Palpitations: Secondary | ICD-10-CM | POA: Diagnosis not present

## 2022-02-10 DIAGNOSIS — R072 Precordial pain: Secondary | ICD-10-CM | POA: Diagnosis not present

## 2022-02-10 MED ORDER — METOPROLOL TARTRATE 100 MG PO TABS: ORAL_TABLET | ORAL | 0 refills | Status: DC

## 2022-02-10 NOTE — Telephone Encounter (Signed)
Will route to Dr Sindy Messing and covering RN to see if there is anything to do prior to coming in today. Pt states that she does not want to go to ED before being seen in office.

## 2022-02-10 NOTE — Progress Notes (Signed)
Cardiology Office Note:    Date:  02/10/2022   ID:  [name], DOB 05-Aug-1961, MRN [id number]  PCP:  Fayrene Helper, MD   [facility] Providers Cardiologist:  Werner Lean, MD     Referring MD: Fayrene Helper, MD   CC: Establish gen cards Consulted for the evaluation of CP at the behest of Fayrene Helper, MD  History of Present Illness:    [name] is a 61 y.o. female with a hx of HTN, IDA, Fibromyalgia IVC Filter NOS, multiple GI surgeries, who presents with chest pain and leg claudication  Patient has had complex abdominal hernia with scar tissues and multiple surgeries She has a history of SVT, AFL, and PAF s/p distant ablation. Lately she has sudden onset chest discomfort, that radiates down her right arm.  In the past she could improve this with a class of cold water.  There is no exertional component.  Her pas SVT symptoms were heart racing- this may be different.  Previously on metoprolol but only has a PRN- stopped for low blood pressure.  Negative LHC in 1990s.  Negative CCTA 2020.  Negative stress test in past.  Past Medical History:  Diagnosis Date   Abdominal hernia 08/14/2019   Abdominal pain 06/06/2019   Acute bronchitis 05/01/2013   Acute cystitis 06/09/2010   Qualifier: Diagnosis of  By: Cori Razor LPN, Brandi     Acute renal insufficiency 08/28/2011   Allergy    Phreesia 03/16/2021   Anemia    Phreesia 10/29/2020   Anxiety    Anxiety state 03/16/2008   Qualifier: Diagnosis of  By: Dierdre Harness     Arthritis    Phreesia 10/29/2020   Back pain with radiation 07/17/2014   Bilateral hand pain 07/27/2016   Blood transfusion without reported diagnosis    Phreesia 10/29/2020   Breast cancer ([place])    L breast- 2006   Cancer ([place])    Phreesia 10/29/2020   Carpal tunnel syndrome    Chronic constipation    Chronic pain syndrome    followed by [facility]---  back   Clotting disorder ([place])    Phreesia 10/29/2020    Depression    Dermatitis 12/15/2011   Difficult intubation    Educated about COVID-19 virus infection 05/14/2019   Fall at home 01/26/2016   Family history of adverse reaction to anesthesia    MOTHER--- PONV   Fatigue 09/17/2012   Fibromyalgia    FIBROMYALGIA 03/16/2008   Qualifier: Diagnosis of  By: Dierdre Harness     GERD (gastroesophageal reflux disease)    Headache disorder 01/16/2013   Headache syndrome 01/26/2016   Hip pain, right 07/08/2019   History of MRSA infection    lip abscess   History of ovarian cyst 06/2011   s/p  BSO   History of pulmonary embolus (PE) 1997   post EP with ablation pulmonary veouns for SVT/ Atrial Fib.   History of supraventricular tachycardia    s/p  ablation 1996  and 1997  by dr Caryl Comes   History of TIA (transient ischemic attack) 1997   post op EP ablation PE   Hyperlipidemia    Hyperlipidemia LDL goal <100 03/16/2008   Qualifier: Diagnosis of  By: Dierdre Harness     Hypothyroidism    followed by pcp   Incisional hernia    Insomnia 12/15/2011   Intermittent palpitations 08/22/2017   Interstitial cystitis    09-13-2018   per pt last flare-up  May 2019 (followed by pcp)   INTERSTITIAL CYSTITIS 02/17/2011   Qualifier: Diagnosis of  By: Moshe Cipro MD, Margaret     Iron deficiency anemia    09-13-2018  PER PT STABLE   Irregular heart rate 07/25/2013   Lipoma of back    upper   Low back pain radiating to right leg 07/04/2019   Low ferritin level 06/14/2014   MDD (major depressive disorder), single episode, in full remission ([place]) 10/27/2017   Medically noncompliant 02/28/2015   Multiple missed appointments, both follow-up appointments and lab appointments.    Metabolic syndrome X 0/09/9322   Qualifier: Diagnosis of  By: Moshe Cipro MD, Margaret  hBA1c is 5.8 in 02/2013    Migraines    Morbid obesity ([place]) 03/27/2013   Muscle spasm 01/03/2020   Nausea alone 07/17/2014   NECK PAIN, CHRONIC 03/16/2008   Qualifier: Diagnosis of  By: Dierdre Harness     Normal  coronary arteries    a. by CT 12/2018.   Obesity 03/16/2008   Qualifier: Diagnosis of  By: Dierdre Harness     Oral ulceration 08/23/2014   Presented at 06/14/2014 visit    Other malaise and fatigue 03/16/2008   Centricity Description: FATIGUE, CHRONIC Qualifier: Diagnosis of  By: Dierdre Harness   Centricity Description: FATIGUE Qualifier: Diagnosis of  By: Claybon Jabs PA, Dawn     OVARIAN CYST 12/26/2009   Qualifier: Diagnosis of  By: Moshe Cipro MD, Margaret     Paget disease of breast, left [facility])    Paget's disease of breast, left ([place]) 03/16/2008   Qualifier: Diagnosis of  By: Dierdre Harness  Left diagnosed in 2006 F/h breast cancer x 15 family members   Partial small bowel obstruction ([place]) 07/04/2012   PONV (postoperative nausea and vomiting)    SEVERE   Postsurgical menopause 11/13/2011   Presence of IVC filter 06/06/2019   PSVT (paroxysmal supraventricular tachycardia) ([place])    [place]   Recurrent oral herpes simplex infection 10/13/2017   ROM (right otitis media) 01/23/2013   S/P insertion of IVC (inferior vena caval) filter 05/08/2005   [place] (non-retrievable)  /  dx 2019  a leg of filter is protruding thru the vena cava in to right L2 vertebral body (09-13-2018  per pt having surgery to remove filter in [place])   S/P radiofrequency ablation operation for arrhythmia 1996   1996  and 1997,   SVT and Atrial Fib   Sinusitis, chronic 01/26/2016   SMALL BOWEL OBSTRUCTION, HX OF 08/07/2008   Annotation: obstruction w/ adhesions led to partial colectomy Qualifier: Diagnosis of  By: Craige Cotta     Stroke [facility])    [place] 10/29/2020, TIAs in the past   Swelling of hand 09/17/2012   Syncope    Tachycardia 02/03/2010   Qualifier: Diagnosis of  By: Via LPN, Lynn     Thyroid disease    Phreesia 10/29/2020   Ulcer 12/15/2011   Urinary frequency 07/18/2014   Vaginitis and vulvovaginitis 05/10/2013   Vitamin D deficiency 05/05/2015   Wears glasses     Past Surgical History:   Procedure Laterality Date   ABDOMINAL HYSTERECTOMY  1987   ANTERIOR CERVICAL DECOMP/DISCECTOMY FUSION  03-07-2002   dr elsner  @[facility]    C 4 -- 5   APPENDECTOMY  1980   AUGMENTATION MAMMAPLASTY Right 2006   BILATERAL SALPINGOOPHORECTOMY  07/24/2011   via Explor. Lap. w/ intraoperative perf. bowel repair   BIOPSY  05/02/2021   Procedure: BIOPSY;  Surgeon: Montez Morita,  Quillian Quince, MD;  [facility];  Service: Gastroenterology;;  small bowel, mid esophagus, distal esophagus, random colon biopsies   BIOPSY  12/04/2021   Procedure: BIOPSY;  Surgeon: [name], DO;  [facility];  Service: Endoscopy;;   BREAST BIOPSY Right 2019   benign   BREAST ENHANCEMENT SURGERY Bilateral 1993   BREAST IMPLANT REMOVAL Bilateral    BREAST SURGERY N/A    Phreesia 10/29/2020   CARDIAC CATHETERIZATION  07-06-2003   dr Darnell Level brodie   normal coronaries and LVF   CARDIAC ELECTROPHYSIOLOGY STUDY AND ABLATION  1996  and 1997   CARDIOVASCULAR STRESS TEST  11-18-2015   dr Caryl Comes   normal nuclear study w/ no ischemia/  normal LV function and wall motion , ef 84%   CARPAL TUNNEL RELEASE Right ?   CESAREAN SECTION  1986   CESAREAN SECTION N/A    Phreesia 10/29/2020   CHOLECYSTECTOMY N/A    Phreesia 10/29/2020   COLON SURGERY     COLONOSCOPY WITH PROPOFOL N/A 05/02/2021   Procedure: COLONOSCOPY WITH PROPOFOL;  Surgeon: Harvel Quale, MD;  [facility];  Service: Gastroenterology;  Laterality: N/A;  12:30 PM   CYSTO/  HYDRODISTENTION/  INSTILATION THERAPY  MULTIPLE   ENTEROCUTANEOUS FISTULA CLOSURE  multiple   last one 2015 with small bowel resection   ESOPHAGOGASTRODUODENOSCOPY (EGD) WITH PROPOFOL N/A 05/02/2021   Procedure: ESOPHAGOGASTRODUODENOSCOPY (EGD) WITH PROPOFOL;  Surgeon: Harvel Quale, MD;  [facility];  Service: Gastroenterology;  Laterality: N/A;   ESOPHAGOGASTRODUODENOSCOPY (EGD) WITH PROPOFOL N/A 12/04/2021   Procedure:  ESOPHAGOGASTRODUODENOSCOPY (EGD) WITH PROPOFOL;  Surgeon: [name], DO;  [facility];  Service: Endoscopy;  Laterality: N/A;   EXPLORATORY LAPAROTOMY INCISIONAL VENTRAL HERNIA REPAIR / RESECTION SMALL BOWEL  11-09-2014   @[facility]    FRACTURE SURGERY N/A    Phreesia 10/29/2020   JOINT REPLACEMENT N/A    Phreesia 10/29/2020   LIPOMA EXCISION Right 09/16/2018   Procedure: EXCISION LIPOMA UPPER BACK;  Surgeon: Clovis Riley, MD;  Location: [place];  Service: General;  Laterality: Right;   MASTECTOMY Left 2006   w/ reconstruction on left (paget's disease)  and right breast augmentation   POLYPECTOMY  05/02/2021   Procedure: POLYPECTOMY;  Surgeon: Harvel Quale, MD;  [facility];  Service: Gastroenterology;;  gastric   SAVORY DILATION  05/02/2021   Procedure: Azzie Almas DILATION;  Surgeon: Harvel Quale, MD;  [facility];  Service: Gastroenterology;;   SMALL INTESTINE SURGERY N/A    Phreesia 10/29/2020   SPINE SURGERY N/A    Phreesia 10/29/2020   TOTAL COLECTOMY  08-04-2002    @[facility]    AND CHOLECYSTECTOMY  (colonic inertia)   TRANSTHORACIC ECHOCARDIOGRAM  02/04/2016   ef 60-65%,  grade 2 diastolic dysfunction/  mild MR   TUBAL LIGATION N/A    Phreesia 03/16/2021   VENA CAVA FILTER PLACEMENT  05/08/2005   @[facility]    [place] (non-retrievable)   WIDE EXCISION PERIRECTAL ABSCESSES  09-22-2005   @ [facility]    Current Medications: Current Meds  Medication Sig   ALPRAZolam (XANAX) 1 MG tablet Take 1 tablet (1 mg total) by mouth 2 (two) times daily.   aspirin 81 MG chewable tablet Chew 81 mg by mouth daily.   butalbital-acetaminophen-caffeine (FIORICET) 50-325-40 MG tablet Take 1 tablet by mouth as needed.   conjugated estrogens (PREMARIN) vaginal cream Place fingertip amount intravaginally 2 to 3 times per week (Patient taking  differently: Place 1 mg vaginally at bedtime. Place fingertip amount intravaginally 2 to 3 times  per week)   cyanocobalamin (,VITAMIN B-12,) 1000 MCG/ML injection Inject 1 mL (1,000 mcg total) into the muscle every 30 (thirty) days.   cyclobenzaprine (FLEXERIL) 10 MG tablet TAKE 1 TABLET BY MOUTH TWICE DAILY AS NEEDED FOR MUSCLE SPASMS   cycloSPORINE (RESTASIS) 0.05 % ophthalmic emulsion Place 1 drop into both eyes 2 (two) times daily as needed (dry eyes).   DULoxetine (CYMBALTA) 30 MG capsule Take 30 mg by mouth daily.   DULoxetine (CYMBALTA) 60 MG capsule Take by mouth daily at 6 (six) AM.   Erythromycin 250 MG TBEC Take 250 mg by mouth 3 (three) times daily before meals.   ezetimibe (ZETIA) 10 MG tablet Take 1 tablet (10 mg total) by mouth daily.   fluticasone (FLONASE) 50 MCG/ACT nasal spray Place 2 sprays into both nostrils daily as needed for allergies.   HYDROmorphone (DILAUDID) 4 MG tablet Take 4 mg by mouth in the morning, at noon, and at bedtime.   levothyroxine (SYNTHROID) 125 MCG tablet TAKE 1 TABLET(125 MCG) BY MOUTH DAILY BEFORE BREAKFAST   lubiprostone (AMITIZA) 24 MCG capsule Take 1 capsule (24 mcg total) by mouth 2 (two) times daily with a meal.   magnesium oxide (MAG-OX) 400 (240 Mg) MG tablet Take 1 tablet (400 mg total) by mouth 2 (two) times daily. Please make overdue appt with Dr. Caryl Comes before anymore refills. Thank you 1st attempt   meclizine (ANTIVERT) 25 MG tablet Take 1 tablet (25 mg total) by mouth 3 (three) times daily as needed for dizziness.   metFORMIN (GLUCOPHAGE-XR) 500 MG 24 hr tablet in the morning and at bedtime.   metoprolol tartrate (LOPRESSOR) 100 MG tablet Take 2 hours prior to Cardiac CT   omeprazole (PRILOSEC) 40 MG capsule Take 1 capsule (40 mg total) by mouth in the morning and at bedtime.   potassium chloride (KLOR-CON M) 10 MEQ tablet Take one tablet by mouth once daily   pravastatin (PRAVACHOL) 40 MG tablet TAKE 1 TABLET(40 MG) BY MOUTH DAILY   senna-docusate (SENOKOT-S) 8.6-50 MG per tablet Take 2 tablets by mouth 3 (three) times daily.  constipation   sucralfate (CARAFATE) 1 g tablet TAKE 1 TABLET(1 GRAM) BY MOUTH FOUR TIMES DAILY AT BEDTIME WITH MEALS   XARELTO 20 MG TABS tablet TAKE 1 TABLET(20 MG) BY MOUTH DAILY     Allergies:   Nitrofurantoin, Crestor [rosuvastatin calcium], Bisacodyl, Clarithromycin, Clarithromycin, Clindamycin hcl, Codeine, Iron sucrose, Monosodium glutamate, and Scopolamine hbr   Social History   Socioeconomic History   Marital status: Married    Spouse name: Not on file   Number of children: Not on file   Years of education: Not on file   Highest education level: Not on file  Occupational History   Not on file  Tobacco Use   Smoking status: Never   Smokeless tobacco: Never  Vaping Use   Vaping Use: Never used  Substance and Sexual Activity   Alcohol use: No    Comment: twice per year   Drug use: No   Sexual activity: Yes    Birth control/protection: Surgical  Other Topics Concern   Not on file  Social History Narrative   Not on file   Social Determinants of Health   Financial Resource Strain: Medium Risk   Difficulty of Paying Living Expenses: Somewhat hard  Food Insecurity: No Food Insecurity   Worried About Running Out of Food in the Last  Year: Never true   Supreme in the Last Year: Never true  Transportation Needs: No Transportation Needs   Lack of Transportation (Medical): No   Lack of Transportation (Non-Medical): No  Physical Activity: Insufficiently Active   Days of Exercise per Week: 7 days   Minutes of Exercise per Session: 20 min  Stress: No Stress Concern Present   Feeling of Stress : Not at all  Social Connections: Moderately Isolated   Frequency of Communication with Friends and Family: More than three times a week   Frequency of Social Gatherings with Friends and Family: More than three times a week   Attends Religious Services: Never   Marine scientist or Organizations: No   Attends Music therapist: Never   Marital Status:  Married     Family History: [name] family history includes Alcohol abuse in her father; Bone cancer in her maternal grandfather; Brain cancer in her maternal aunt; Breast cancer in her cousin, maternal aunt, maternal aunt, and maternal aunt; Cancer in her maternal uncle; Colon cancer in her maternal aunt; Diabetes in her mother; Heart disease in her father and mother; Hyperlipidemia in her father; Hypertension in her father and mother; Ovarian cancer (age of onset: 53) in her cousin; Prostate cancer in her maternal uncle.  ROS:   Please see the history of present illness.     All other systems reviewed and are negative.  EKGs/Labs/Other Studies Reviewed:    The following studies were reviewed today:  EKG:  EKG is  ordered today.  The ekg ordered today demonstrates  02/10/22: SR rate 92 low voltage EKG  Recent Labs: 09/11/2021: TSH 2.101 12/04/2021: ALT 24 12/12/2021: BUN 21; Creatinine, Ser 0.79; Hemoglobin 12.9; Magnesium 1.4; Platelets 198; Potassium 3.5; Sodium 139  Recent Lipid Panel    Component Value Date/Time   CHOL 127 09/11/2021 1401   CHOL 146 01/15/2021 1540   TRIG 278 (H) 09/11/2021 1401   HDL 37 (L) 09/11/2021 1401   HDL 42 01/15/2021 1540   CHOLHDL 3.4 09/11/2021 1401   VLDL 56 (H) 09/11/2021 1401   LDLCALC 34 09/11/2021 1401   LDLCALC 60 01/15/2021 1540   LDLDIRECT 138 (H) 09/15/2012 1540    Physical Exam:    VS:  BP (!) 144/84    Pulse 92    Ht 5' 4.5" (1.638 m)    Wt 88 kg    SpO2 98%    BMI 32.79 kg/m     Wt Readings from Last 3 Encounters:  02/10/22 88 kg  01/08/22 87.6 kg  12/30/21 81.7 kg    Gen: no distress,    Neck: No JVD  Cardiac: No Rubs or Gallops, no murmur, RRR and +2 radial pulses Respiratory: Clear to auscultation bilaterally, normal effort, normal  respiratory rate GI: Soft, nontender, non-distended  MS: No  edema;  moves all extremities Integument: Skin feels warm Neuro:  At time of evaluation, alert and oriented to  person/place/time/situation  Psych: Anxious mood and affect   ASSESSMENT:    1. Chest pain of uncertain etiology   2. Palpitations    PLAN:    Precordial CP History of SVT, AF and AFL s/p prior ablation HTN  - will repeat Cardiac CT - patient has symptoms at least once a day: will repeat heart monitor (Ziopatch one week non-live) - patient is on Xarelto (prior history of PE and DVT), unclear while still on ASA, if I cannot clarify through chart review will stop  ASA - patient was prior on metoprolol 25 mg PO PRN palpitations, after heart monitor we may return this - continue Zetia 10 mg PO and pravastatin, will set LDL goal post repeat CCTA           Medication Adjustments/Labs and Tests Ordered: Current medicines are reviewed at length with the patient today.  Concerns regarding medicines are outlined above.  Orders Placed This Encounter  Procedures   Basic metabolic panel   LONG TERM MONITOR (3-14 DAYS)   EKG 12-Lead   Meds ordered this encounter  Medications   metoprolol tartrate (LOPRESSOR) 100 MG tablet    Sig: Take 2 hours prior to Cardiac CT    Dispense:  1 tablet    Refill:  0    Patient Instructions  Medication Instructions:  Your physician recommends that you continue on your current medications as directed. Please refer to the Current Medication list given to you today.  *If you need a refill on your cardiac medications before your next appointment, please call your pharmacy*   Lab Work: TODAY: BMET  If you have labs (blood work) drawn today and your tests are completely normal, you will receive your results only by: [place] (if you have MyChart) OR A paper copy in the mail If you have any lab test that is abnormal or we need to change your treatment, we will call you to review the results.   Testing/Procedures: Your physician has requested that you have cardiac CT. Cardiac computed tomography (CT) is a painless test that uses an x-ray  machine to take clear, detailed pictures of your heart. For further information please visit [email or web address]. Please follow instruction sheet as given.   Your physician has requested that you wear a 7 day heart monitor.    Follow-Up: At [facility], you and your health needs are our priority.  As part of our continuing mission to provide you with exceptional heart care, we have created designated Provider Care Teams.  These Care Teams include your primary Cardiologist (physician) and Advanced Practice Providers (APPs -  Physician Assistants and Nurse Practitioners) who all work together to provide you with the care you need, when you need it.    Your next appointment:   3 month(s)  The format for your next appointment:   In Person  Provider:   Werner Lean, MD     Other Instructions    Your cardiac CT will be scheduled at one of the below locations:   [facility] [address] [place], [place] [address] [phone]  At [facility], please arrive at the [facility] main entrance (entrance A) of [facility] 30 minutes prior to test start time. You can use the FREE valet parking offered at the main entrance (encouraged to control the heart rate for the test) Proceed to the [facility] Radiology Department (first floor) to check-in and test prep.  Please follow these instructions carefully (unless otherwise directed):   On the Night Before the Test: Be sure to Drink plenty of water. Do not consume any caffeinated/decaffeinated beverages or chocolate 12 hours prior to your test. Do not take any antihistamines( Flonase, Meclizine) 12 hours prior to your test.   On the Day of the Test: Drink plenty of water until 1 hour prior to the test. Do not eat any food 4 hours prior to the test. You may take your regular medications prior to the test.  Take metoprolol (Lopressor) 100 mg by  mouth two hours prior to test. FEMALES- please  wear underwire-free bra if available, avoid dresses & tight clothing       After the Test: Drink plenty of water. After receiving IV contrast, you may experience a mild flushed feeling. This is normal. On occasion, you may experience a mild rash up to 24 hours after the test. This is not dangerous. If this occurs, you can take Benadryl 25 mg and increase your fluid intake. If you experience trouble breathing, this can be serious. If it is severe call 911 IMMEDIATELY. If it is mild, please call our office. If you take any of these medications: Glipizide/Metformin, Avandament, Glucavance, please do not take 48 hours after completing test unless otherwise instructed.  We will call to schedule your test 2-4 weeks out understanding that some insurance companies will need an authorization prior to the service being performed.   For non-scheduling related questions, please contact the cardiac imaging nurse navigator should you have any questions/concerns: Marchia Bond, Cardiac Imaging Nurse Navigator Gordy Clement, Cardiac Imaging Nurse Navigator [facility] Direct Office Dial: [phone]   For scheduling needs, including cancellations and rescheduling, please call [place], [phone].     ZIO XT- Long Term Monitor Instructions  Your physician has requested you wear a ZIO patch monitor for 7 days.  This is a single patch monitor. [employer] supplies one patch monitor per enrollment. Additional stickers are not available. Please do not apply patch if you will be having a Nuclear Stress Test,  Echocardiogram, Cardiac CT, MRI, or Chest Xray during the period you would be wearing the  monitor. The patch cannot be worn during these tests. You cannot remove and re-apply the  ZIO XT patch monitor.  Your ZIO patch monitor will be mailed 3 day USPS to your address on file. It may take 3-5 days  to receive your monitor after you have been enrolled.  Once you have received  your monitor, please review the enclosed instructions. Your monitor  has already been registered assigning a specific monitor serial # to you.  Billing and Patient Assistance Program Information  We have supplied [employer] with any of your insurance information on file for billing purposes. [employer] offers a sliding scale Patient Assistance Program for patients that do not have  insurance, or whose insurance does not completely cover the cost of the ZIO monitor.  You must apply for the Patient Assistance Program to qualify for this discounted rate.  To apply, please call [employer] at [phone], select option 4, select option 2, ask to apply for  Patient Assistance Program. Theodore Demark will ask your household income, and how many people  are in your household. They will quote your out-of-pocket cost based on that information.  [employer] will also be able to set up a 39-month, interest-free payment plan if needed.  Applying the monitor   Shave hair from upper left chest.  Hold abrader disc by orange tab. Rub abrader in 40 strokes over the upper left chest as  indicated in your monitor instructions.  Clean area with 4 enclosed alcohol pads. Let dry.  Apply patch as indicated in monitor instructions. Patch will be placed under collarbone on left  side of chest with arrow pointing upward.  Rub patch adhesive wings for 2 minutes. Remove white label marked "1". Remove the white  label marked "2". Rub patch adhesive wings for 2 additional minutes.  While looking in a mirror, press and release button in center of patch. A  small green light will  flash 3-4 times. This will be your only indicator that the monitor has been turned on.  Do not shower for the first 24 hours. You may shower after the first 24 hours.  Press the button if you feel a symptom. You will hear a small click. Record Date, Time and  Symptom in the Patient Logbook.  When you are ready to remove the patch, follow instructions on the last  2 pages of Patient  Logbook. Stick patch monitor onto the last page of Patient Logbook.  Place Patient Logbook in the blue and white box. Use locking tab on box and tape box closed  securely. The blue and white box has prepaid postage on it. Please place it in the mailbox as  soon as possible. Your physician should have your test results approximately 7 days after the  monitor has been mailed back to [facility].  Call [place] at [phone] if you have questions regarding  your ZIO XT patch monitor. Call them immediately if you see an orange light blinking on your  monitor.  If your monitor falls off in less than 4 days, contact our Monitor department at [phone].  If your monitor becomes loose or falls off after 4 days call [employer] at [phone] for  suggestions on securing your monitor    Signed, Werner Lean, MD  02/10/2022 4:45 PM    [facility]  .chmg

## 2022-02-10 NOTE — Progress Notes (Unsigned)
Enrolled for [employer] to mail a ZIO XT long term holter monitor to the patients address on file.  

## 2022-02-10 NOTE — Progress Notes (Signed)
Cardiology Office Note:    Date:  02/10/2022   ID:  [name], DOB [birth year], MRN [id number]  PCP:  Fayrene Helper, MD   [facility] Providers Cardiologist:  Werner Lean, MD     Referring MD: Fayrene Helper, MD   No chief complaint on file. CC: establish gen cards Consulted for the evaluation of CP at the behest of Fayrene Helper, MD  History of Present Illness:    [name] is a 61 y.o. female with a hx of HTN, IDA, Fibromyalgia IVC Filter NOS, Who presents with chest pain and leg claudication.   Patient reports that over the past year and a half she has noticed pain that originates in the center of her chest and shoots up to her throat and then moves down her right arm.  She reports that this happens irregularly and she cannot notice any trigger.  She says that it happens at least daily and initially was lasting for less than a minute but is now averaging a minute and then she has an episode that lasted up to 2-1/2 minutes.  She reports at the same time she is having these events she notices nausea, fatigue, sweating, shortness of breath.  Reports that initially she can drink cold water and the symptoms would go away but would return soon after.  Patient acknowledges that she has had multiple GI surgeries and initially they were concerned that this was related to her GI issues rather than her heart.  She would like to make sure that it is not her part given her past history of multiple irregular heart rhythms including SVT requiring ablation.  Patient reports that these episodes do not feel like her episodes of SVT which she describes as feeling like her heart was going to beat out of her chest.  When having episodes of SVT she could use vagal maneuvers to help with them.  Coronary CT on 01/11/2019 showing coronary calcium score of 0.  It did note slight prominence of the central pulmonary arteries which could reflect early pulmonary  arterial hypertension.   Past Medical History:  Diagnosis Date   Abdominal hernia 08/14/2019   Abdominal pain 06/06/2019   Acute bronchitis 05/01/2013   Acute cystitis 06/09/2010   Qualifier: Diagnosis of  By: Cori Razor LPN, Brandi     Acute renal insufficiency 08/28/2011   Allergy    Phreesia 03/16/2021   Anemia    Phreesia 10/29/2020   Anxiety    Anxiety state 03/16/2008   Qualifier: Diagnosis of  By: Dierdre Harness     Arthritis    Phreesia 10/29/2020   Back pain with radiation 07/17/2014   Bilateral hand pain 07/27/2016   Blood transfusion without reported diagnosis    Phreesia 10/29/2020   Breast cancer ([place])    L breast- 2006   Cancer ([place])    Phreesia 10/29/2020   Carpal tunnel syndrome    Chronic constipation    Chronic pain syndrome    followed by [facility]---  back   Clotting disorder ([place])    Phreesia 10/29/2020   Depression    Dermatitis 12/15/2011   Difficult intubation    Educated about COVID-19 virus infection 05/14/2019   Fall at home 01/26/2016   Family history of adverse reaction to anesthesia    MOTHER--- PONV   Fatigue 09/17/2012   Fibromyalgia    FIBROMYALGIA 03/16/2008   Qualifier: Diagnosis of  By: Dierdre Harness     GERD (gastroesophageal  reflux disease)    Headache disorder 01/16/2013   Headache syndrome 01/26/2016   Hip pain, right 07/08/2019   History of MRSA infection    lip abscess   History of ovarian cyst 06/2011   s/p  BSO   History of pulmonary embolus (PE) 1997   post EP with ablation pulmonary veouns for SVT/ Atrial Fib.   History of supraventricular tachycardia    s/p  ablation 1996  and 1997  by dr Caryl Comes   History of TIA (transient ischemic attack) 1997   post op EP ablation PE   Hyperlipidemia    Hyperlipidemia LDL goal <100 03/16/2008   Qualifier: Diagnosis of  By: Dierdre Harness     Hypothyroidism    followed by pcp   Incisional hernia    Insomnia 12/15/2011   Intermittent palpitations 08/22/2017   Interstitial cystitis     09-13-2018   per pt last flare-up  May 2019 (followed by pcp)   INTERSTITIAL CYSTITIS 02/17/2011   Qualifier: Diagnosis of  By: Moshe Cipro MD, Margaret     Iron deficiency anemia    09-13-2018  PER PT STABLE   Irregular heart rate 07/25/2013   Lipoma of back    upper   Low back pain radiating to right leg 07/04/2019   Low ferritin level 06/14/2014   MDD (major depressive disorder), single episode, in full remission ([place]) 10/27/2017   Medically noncompliant 02/28/2015   Multiple missed appointments, both follow-up appointments and lab appointments.    Metabolic syndrome X 04/15/3789   Qualifier: Diagnosis of  By: Moshe Cipro MD, Margaret  hBA1c is 5.8 in 02/2013    Migraines    Morbid obesity ([place]) 03/27/2013   Muscle spasm 01/03/2020   Nausea alone 07/17/2014   NECK PAIN, CHRONIC 03/16/2008   Qualifier: Diagnosis of  By: Dierdre Harness     Normal coronary arteries    a. by CT 12/2018.   Obesity 03/16/2008   Qualifier: Diagnosis of  By: Dierdre Harness     Oral ulceration 08/23/2014   Presented at 06/14/2014 visit    Other malaise and fatigue 03/16/2008   Centricity Description: FATIGUE, CHRONIC Qualifier: Diagnosis of  By: Dierdre Harness   Centricity Description: FATIGUE Qualifier: Diagnosis of  By: Claybon Jabs PA, [place]     OVARIAN CYST 12/26/2009   Qualifier: Diagnosis of  By: Moshe Cipro MD, Margaret     Paget disease of breast, left [facility])    Paget's disease of breast, left ([place]) 03/16/2008   Qualifier: Diagnosis of  By: Dierdre Harness  Left diagnosed in 2006 F/h breast cancer x 15 family members   Partial small bowel obstruction ([place]) 07/04/2012   PONV (postoperative nausea and vomiting)    SEVERE   Postsurgical menopause 11/13/2011   Presence of IVC filter 06/06/2019   PSVT (paroxysmal supraventricular tachycardia) ([place])    [place]   Recurrent oral herpes simplex infection 10/13/2017   ROM (right otitis media) 01/23/2013   S/P insertion of IVC (inferior vena caval) filter 05/08/2005    [place] (non-retrievable)  /  dx 2019  a leg of filter is protruding thru the vena cava in to right L2 vertebral body (09-13-2018  per pt having surgery to remove filter in [place])   S/P radiofrequency ablation operation for arrhythmia 1996   1996  and 1997,   SVT and Atrial Fib   Sinusitis, chronic 01/26/2016   SMALL BOWEL OBSTRUCTION, HX OF 08/07/2008   Annotation: obstruction w/ adhesions led to partial colectomy Qualifier:  Diagnosis of  By: Craige Cotta     Stroke [facility])    [place] 10/29/2020, TIAs in the past   Swelling of hand 09/17/2012   Syncope    Tachycardia 02/03/2010   Qualifier: Diagnosis of  By: Via LPN, Lynn     Thyroid disease    Phreesia 10/29/2020   Ulcer 12/15/2011   Urinary frequency 07/18/2014   Vaginitis and vulvovaginitis 05/10/2013   Vitamin D deficiency 05/05/2015   Wears glasses     Past Surgical History:  Procedure Laterality Date   ABDOMINAL HYSTERECTOMY  1987   ANTERIOR CERVICAL DECOMP/DISCECTOMY FUSION  03-07-2002   dr elsner  @[facility]    C 4 -- 5   APPENDECTOMY  1980   AUGMENTATION MAMMAPLASTY Right 2006   BILATERAL SALPINGOOPHORECTOMY  07/24/2011   via Explor. Lap. w/ intraoperative perf. bowel repair   BIOPSY  05/02/2021   Procedure: BIOPSY;  Surgeon: Montez Morita, Quillian Quince, MD;  [facility];  Service: Gastroenterology;;  small bowel, mid esophagus, distal esophagus, random colon biopsies   BIOPSY  12/04/2021   Procedure: BIOPSY;  Surgeon: [name], DO;  [facility];  Service: Endoscopy;;   BREAST BIOPSY Right 2019   benign   BREAST ENHANCEMENT SURGERY Bilateral 1993   BREAST IMPLANT REMOVAL Bilateral    BREAST SURGERY N/A    Phreesia 10/29/2020   CARDIAC CATHETERIZATION  07-06-2003   dr Darnell Level brodie   normal coronaries and LVF   [place] TEST  11-18-2015   dr Caryl Comes   normal nuclear study w/ no ischemia/  normal LV function and wall motion ,  ef 84%   CARPAL TUNNEL RELEASE Right ?   CESAREAN SECTION  1986   CESAREAN SECTION N/A    Phreesia 10/29/2020   CHOLECYSTECTOMY N/A    Phreesia 10/29/2020   COLON SURGERY     COLONOSCOPY WITH PROPOFOL N/A 05/02/2021   Procedure: COLONOSCOPY WITH PROPOFOL;  Surgeon: Harvel Quale, MD;  [facility];  Service: Gastroenterology;  Laterality: N/A;  12:30 PM   CYSTO/  HYDRODISTENTION/  INSTILATION THERAPY  MULTIPLE   ENTEROCUTANEOUS FISTULA CLOSURE  multiple   last one 2015 with small bowel resection   ESOPHAGOGASTRODUODENOSCOPY (EGD) WITH PROPOFOL N/A 05/02/2021   Procedure: ESOPHAGOGASTRODUODENOSCOPY (EGD) WITH PROPOFOL;  Surgeon: Harvel Quale, MD;  [facility];  Service: Gastroenterology;  Laterality: N/A;   ESOPHAGOGASTRODUODENOSCOPY (EGD) WITH PROPOFOL N/A 12/04/2021   Procedure: ESOPHAGOGASTRODUODENOSCOPY (EGD) WITH PROPOFOL;  Surgeon: [name], DO;  [facility];  Service: Endoscopy;  Laterality: N/A;   EXPLORATORY LAPAROTOMY INCISIONAL VENTRAL HERNIA REPAIR / RESECTION SMALL BOWEL  11-09-2014   @[facility]    FRACTURE SURGERY N/A    Phreesia 10/29/2020   JOINT REPLACEMENT N/A    Phreesia 10/29/2020   LIPOMA EXCISION Right 09/16/2018   Procedure: EXCISION LIPOMA UPPER BACK;  Surgeon: Clovis Riley, MD;  Location: [place];  Service: General;  Laterality: Right;   MASTECTOMY Left 2006   w/ reconstruction on left (paget's disease)  and right breast augmentation   POLYPECTOMY  05/02/2021   Procedure: POLYPECTOMY;  Surgeon: Harvel Quale, MD;  [facility];  Service: Gastroenterology;;  gastric   SAVORY DILATION  05/02/2021   Procedure: Azzie Almas DILATION;  Surgeon: Harvel Quale, MD;  [facility];  Service: Gastroenterology;;   SMALL INTESTINE SURGERY N/A    Royden Purl  10/29/2020   SPINE SURGERY N/A    Phreesia 10/29/2020   TOTAL COLECTOMY  08-04-2002    @[facility]    AND  CHOLECYSTECTOMY  (colonic inertia)   TRANSTHORACIC ECHOCARDIOGRAM  02/04/2016   ef 60-65%,  grade 2 diastolic dysfunction/  mild MR   TUBAL LIGATION N/A    Phreesia 03/16/2021   VENA CAVA FILTER PLACEMENT  05/08/2005   @[facility]    [place] (non-retrievable)   WIDE EXCISION PERIRECTAL ABSCESSES  09-22-2005   @ [facility]    Current Medications: Current Meds  Medication Sig   ALPRAZolam (XANAX) 1 MG tablet Take 1 tablet (1 mg total) by mouth 2 (two) times daily.   aspirin 81 MG chewable tablet Chew 81 mg by mouth daily.   butalbital-acetaminophen-caffeine (FIORICET) 50-325-40 MG tablet Take 1 tablet by mouth as needed.   conjugated estrogens (PREMARIN) vaginal cream Place fingertip amount intravaginally 2 to 3 times per week (Patient taking differently: Place 1 mg vaginally at bedtime. Place fingertip amount intravaginally 2 to 3 times per week)   cyanocobalamin (,VITAMIN B-12,) 1000 MCG/ML injection Inject 1 mL (1,000 mcg total) into the muscle every 30 (thirty) days.   cyclobenzaprine (FLEXERIL) 10 MG tablet TAKE 1 TABLET BY MOUTH TWICE DAILY AS NEEDED FOR MUSCLE SPASMS   cycloSPORINE (RESTASIS) 0.05 % ophthalmic emulsion Place 1 drop into both eyes 2 (two) times daily as needed (dry eyes).   DULoxetine (CYMBALTA) 30 MG capsule Take 30 mg by mouth daily.   DULoxetine (CYMBALTA) 60 MG capsule Take by mouth daily at 6 (six) AM.   Erythromycin 250 MG TBEC Take 250 mg by mouth 3 (three) times daily before meals.   ezetimibe (ZETIA) 10 MG tablet Take 1 tablet (10 mg total) by mouth daily.   fluticasone (FLONASE) 50 MCG/ACT nasal spray Place 2 sprays into both nostrils daily as needed for allergies.   HYDROmorphone (DILAUDID) 4 MG tablet Take 4 mg by mouth in the morning, at noon, and at bedtime.   levothyroxine (SYNTHROID) 125 MCG tablet TAKE 1 TABLET(125 MCG) BY MOUTH DAILY BEFORE BREAKFAST   lubiprostone (AMITIZA) 24 MCG capsule Take 1 capsule (24 mcg total) by mouth 2 (two) times daily with a meal.    magnesium oxide (MAG-OX) 400 (240 Mg) MG tablet Take 1 tablet (400 mg total) by mouth 2 (two) times daily. Please make overdue appt with Dr. Caryl Comes before anymore refills. Thank you 1st attempt   meclizine (ANTIVERT) 25 MG tablet Take 1 tablet (25 mg total) by mouth 3 (three) times daily as needed for dizziness.   metFORMIN (GLUCOPHAGE-XR) 500 MG 24 hr tablet in the morning and at bedtime.   metoprolol tartrate (LOPRESSOR) 100 MG tablet Take 2 hours prior to Cardiac CT   omeprazole (PRILOSEC) 40 MG capsule Take 1 capsule (40 mg total) by mouth in the morning and at bedtime.   potassium chloride (KLOR-CON M) 10 MEQ tablet Take one tablet by mouth once daily   pravastatin (PRAVACHOL) 40 MG tablet TAKE 1 TABLET(40 MG) BY MOUTH DAILY   senna-docusate (SENOKOT-S) 8.6-50 MG per tablet Take 2 tablets by mouth 3 (three) times daily. constipation   sucralfate (CARAFATE) 1 g tablet TAKE 1 TABLET(1 GRAM) BY MOUTH FOUR TIMES DAILY AT BEDTIME WITH MEALS   XARELTO 20 MG TABS tablet TAKE 1 TABLET(20 MG) BY MOUTH DAILY     Allergies:   Nitrofurantoin, Crestor [rosuvastatin calcium], Bisacodyl, Clarithromycin, Clarithromycin, Clindamycin hcl, Codeine, Iron sucrose, Monosodium glutamate, and Scopolamine hbr   Social History   Socioeconomic  History   Marital status: Married    Spouse name: Not on file   Number of children: Not on file   Years of education: Not on file   Highest education level: Not on file  Occupational History   Not on file  Tobacco Use   Smoking status: Never   Smokeless tobacco: Never  Vaping Use   Vaping Use: Never used  Substance and Sexual Activity   Alcohol use: No    Comment: twice per year   Drug use: No   Sexual activity: Yes    Birth control/protection: Surgical  Other Topics Concern   Not on file  Social History Narrative   Not on file   Social Determinants of Health   Financial Resource Strain: Medium Risk   Difficulty of Paying Living Expenses: Somewhat hard   Food Insecurity: No Food Insecurity   Worried About Charity fundraiser in the Last Year: Never true   Ran Out of Food in the Last Year: Never true  Transportation Needs: No Transportation Needs   Lack of Transportation (Medical): No   Lack of Transportation (Non-Medical): No  Physical Activity: Insufficiently Active   Days of Exercise per Week: 7 days   Minutes of Exercise per Session: 20 min  Stress: No Stress Concern Present   Feeling of Stress : Not at all  Social Connections: Moderately Isolated   Frequency of Communication with Friends and Family: More than three times a week   Frequency of Social Gatherings with Friends and Family: More than three times a week   Attends Religious Services: Never   Marine scientist or Organizations: No   Attends Music therapist: Never   Marital Status: Married     Family History: [name] family history includes Alcohol abuse in her father; Bone cancer in her maternal grandfather; Brain cancer in her maternal aunt; Breast cancer in her cousin, maternal aunt, maternal aunt, and maternal aunt; Cancer in her maternal uncle; Colon cancer in her maternal aunt; Diabetes in her mother; Heart disease in her father and mother; Hyperlipidemia in her father; Hypertension in her father and mother; Ovarian cancer (age of onset: 65) in her cousin; Prostate cancer in her maternal uncle.  She reports that her father passed away from heart issues at 57 years old and her mother recently passed away due to "heart issues "in the setting of DKA ROS:   Please see the history of present illness.    Review of Systems  Constitutional:  Positive for diaphoresis and malaise/fatigue.  Eyes:  Negative for blurred vision.  Respiratory:  Positive for shortness of breath.   Cardiovascular:  Positive for chest pain, palpitations and claudication.  Gastrointestinal:  Positive for abdominal pain, nausea and vomiting.  Neurological:  Positive for  dizziness.     All other systems reviewed and are negative.  EKGs/Labs/Other Studies Reviewed:    Procedure: 2D Echo, Cardiac Doppler and Color Doppler   Indications:    Syncope     History:        Patient has prior history of Echocardiogram examinations,  most                  recent 09/17/2020. Signs/Symptoms:Chest Pain and Syncope;  Risk                  Factors:Hypertension and Dyslipidemia.     Sonographer:    Wenda Low  Referring Phys: [phone] DAVID TAT  Sonographer Comments: Suboptimal apical window and patient is morbidly  obese.  IMPRESSIONS     1. Left ventricular ejection fraction, by estimation, is 60 to 65%. The  left ventricle has normal function. The left ventricle has no regional  wall motion abnormalities. Left ventricular diastolic parameters were  normal.   2. Right ventricular systolic function is normal. The right ventricular  size is normal. There is normal pulmonary artery systolic pressure. The  estimated right ventricular systolic pressure is 37.8 mmHg.   3. The mitral valve is grossly normal. Trivial mitral valve  regurgitation.   4. The aortic valve is tricuspid. Aortic valve regurgitation is not  visualized. Aortic valve mean gradient measures 3.0 mmHg.   CT Coronary   The following report is an over-read performed by radiologist Dr. Collene Leyden [facility] [place] on 01/12/2019. This over-read does not include interpretation of cardiac or coronary anatomy or pathology. The coronary CTA interpretation by the cardiologist is attached.   COMPARISON:  None.   FINDINGS: Heart is normal size. Visualized aorta is normal caliber. Mild prominence of the central pulmonary arteries may suggest early pulmonary arterial hypertension. No adenopathy in the lower mediastinum or hila. Scarring in the lingula and left lower lobe. No effusions   Imaging into the upper abdomen shows no acute findings.   Right breast implant noted.  No  acute bony abnormality.   IMPRESSION: Slight prominence of the central pulmonary arteries may reflect early pulmonary arterial hypertension.   No acute extra cardiac abnormality.     Electronically Signed   By: Rolm Baptise M.D.   On: 01/12/2019 09:09   EKG:  EKG is  ordered today.  The ekg ordered today demonstrates normal sinus rhythm   Recent Labs: 09/11/2021: TSH 2.101 12/04/2021: ALT 24 12/12/2021: BUN 21; Creatinine, Ser 0.79; Hemoglobin 12.9; Magnesium 1.4; Platelets 198; Potassium 3.5; Sodium 139  Recent Lipid Panel    Component Value Date/Time   CHOL 127 09/11/2021 1401   CHOL 146 01/15/2021 1540   TRIG 278 (H) 09/11/2021 1401   HDL 37 (L) 09/11/2021 1401   HDL 42 01/15/2021 1540   CHOLHDL 3.4 09/11/2021 1401   VLDL 56 (H) 09/11/2021 1401   LDLCALC 34 09/11/2021 1401   LDLCALC 60 01/15/2021 1540   LDLDIRECT 138 (H) 09/15/2012 1540     Risk Assessment/Calculations:           Physical Exam:    VS:  BP (!) 144/84    Pulse 92    Ht 5' 4.5" (1.638 m)    Wt 194 lb (88 kg)    SpO2 98%    BMI 32.79 kg/m     Wt Readings from Last 3 Encounters:  02/10/22 194 lb (88 kg)  01/08/22 193 lb 3.2 oz (87.6 kg)  12/30/21 180 lb 3.2 oz (81.7 kg)     GEN:  Well nourished, well developed in no acute distress HEENT: Normal NECK: No JVD; No carotid bruits LYMPHATICS: No lymphadenopathy CARDIAC: RRR, no murmurs, rubs, gallops, pulses are equal in upper and lower extremities bilaterally RESPIRATORY:  Clear to auscultation without rales, wheezing or rhonchi  ABDOMEN: Soft, non-tender, non-distended MUSCULOSKELETAL: No lower extremity edema appreciated SKIN: Warm and dry NEUROLOGIC:  Alert and oriented x 3 PSYCHIATRIC:  Normal affect   ASSESSMENT:    1. Chest pain of uncertain etiology   2. Palpitations    PLAN:    In order of problems listed above:  Chest pain Unclear etiology.  Could be related  to GI issues.  Patient does have a history of abnormal heart rhythms  including A-fib, regularly flutter, SVT.  She required cardiac ablation.  Given his history we will repeat cardiac CT as well as place 7-day Zio patch monitor.  Patient was previously on metoprolol 25 mg twice daily but was using it as needed.  This was discontinued while hospitalized due to hypotension.  We will continue to hold at this time but consider restarting or addition of other medication after cardiac CT and Zio patch.  Follow-up in 3 to 4 months or sooner if abnormalities found on either of the ordered studies.       Medication Adjustments/Labs and Tests Ordered: Current medicines are reviewed at length with the patient today.  Concerns regarding medicines are outlined above.  Orders Placed This Encounter  Procedures   Basic metabolic panel   LONG TERM MONITOR (3-14 DAYS)   EKG 12-Lead   Meds ordered this encounter  Medications   metoprolol tartrate (LOPRESSOR) 100 MG tablet    Sig: Take 2 hours prior to Cardiac CT    Dispense:  1 tablet    Refill:  0    Patient Instructions  Medication Instructions:  Your physician recommends that you continue on your current medications as directed. Please refer to the Current Medication list given to you today.  *If you need a refill on your cardiac medications before your next appointment, please call your pharmacy*   Lab Work: TODAY: BMET  If you have labs (blood work) drawn today and your tests are completely normal, you will receive your results only by: [place] (if you have MyChart) OR A paper copy in the mail If you have any lab test that is abnormal or we need to change your treatment, we will call you to review the results.   Testing/Procedures: Your physician has requested that you have cardiac CT. Cardiac computed tomography (CT) is a painless test that uses an x-ray machine to take clear, detailed pictures of your heart. For further information please visit [email or web address].tn. Please follow instruction sheet as  given.   Your physician has requested that you wear a 7 day heart monitor.    Follow-Up: At [facility], you and your health needs are our priority.  As part of our continuing mission to provide you with exceptional heart care, we have created designated Provider Care Teams.  These Care Teams include your primary Cardiologist (physician) and Advanced Practice Providers (APPs -  Physician Assistants and Nurse Practitioners) who all work together to provide you with the care you need, when you need it.    Your next appointment:   3 month(s)  The format for your next appointment:   In Person  Provider:   Werner Lean, MD     Other Instructions    Your cardiac CT will be scheduled at one of the below locations:   [facility] [address] [place], [place] [address] [phone]  At [facility], please arrive at the [facility] main entrance (entrance A) of [facility] 30 minutes prior to test start time. You can use the FREE valet parking offered at the main entrance (encouraged to control the heart rate for the test) Proceed to the [facility] Radiology Department (first floor) to check-in and test prep.  Please follow these instructions carefully (unless otherwise directed):   On the Night Before the Test: Be sure to Drink plenty of water. Do not consume any caffeinated/decaffeinated  beverages or chocolate 12 hours prior to your test. Do not take any antihistamines( Flonase, Meclizine) 12 hours prior to your test.   On the Day of the Test: Drink plenty of water until 1 hour prior to the test. Do not eat any food 4 hours prior to the test. You may take your regular medications prior to the test.  Take metoprolol (Lopressor) 100 mg by mouth two hours prior to test. FEMALES- please wear underwire-free bra if available, avoid dresses & tight clothing       After the Test: Drink plenty of water. After receiving IV contrast, you  may experience a mild flushed feeling. This is normal. On occasion, you may experience a mild rash up to 24 hours after the test. This is not dangerous. If this occurs, you can take Benadryl 25 mg and increase your fluid intake. If you experience trouble breathing, this can be serious. If it is severe call 911 IMMEDIATELY. If it is mild, please call our office. If you take any of these medications: Glipizide/Metformin, Avandament, Glucavance, please do not take 48 hours after completing test unless otherwise instructed.  We will call to schedule your test 2-4 weeks out understanding that some insurance companies will need an authorization prior to the service being performed.   For non-scheduling related questions, please contact the cardiac imaging nurse navigator should you have any questions/concerns: Marchia Bond, Cardiac Imaging Nurse Navigator Gordy Clement, Cardiac Imaging Nurse Navigator [facility] Services Direct Office Dial: [phone]   For scheduling needs, including cancellations and rescheduling, please call [place], [phone].     ZIO XT- Long Term Monitor Instructions  Your physician has requested you wear a ZIO patch monitor for 7 days.  This is a single patch monitor. [employer] supplies one patch monitor per enrollment. Additional stickers are not available. Please do not apply patch if you will be having a Nuclear Stress Test,  Echocardiogram, Cardiac CT, MRI, or Chest Xray during the period you would be wearing the  monitor. The patch cannot be worn during these tests. You cannot remove and re-apply the  ZIO XT patch monitor.  Your ZIO patch monitor will be mailed 3 day USPS to your address on file. It may take 3-5 days  to receive your monitor after you have been enrolled.  Once you have received your monitor, please review the enclosed instructions. Your monitor  has already been registered assigning a specific monitor serial # to  you.  Billing and Patient Assistance Program Information  We have supplied [employer] with any of your insurance information on file for billing purposes. [employer] offers a sliding scale Patient Assistance Program for patients that do not have  insurance, or whose insurance does not completely cover the cost of the ZIO monitor.  You must apply for the Patient Assistance Program to qualify for this discounted rate.  To apply, please call [employer] at [phone], select option 4, select option 2, ask to apply for  Patient Assistance Program. Theodore Demark will ask your household income, and how many people  are in your household. They will quote your out-of-pocket cost based on that information.  [employer] will also be able to set up a 23-month, interest-free payment plan if needed.  Applying the monitor   Shave hair from upper left chest.  Hold abrader disc by orange tab. Rub abrader in 40 strokes over the upper left chest as  indicated in your monitor instructions.  Clean area with 4 enclosed alcohol pads.  Let dry.  Apply patch as indicated in monitor instructions. Patch will be placed under collarbone on left  side of chest with arrow pointing upward.  Rub patch adhesive wings for 2 minutes. Remove white label marked "1". Remove the white  label marked "2". Rub patch adhesive wings for 2 additional minutes.  While looking in a mirror, press and release button in center of patch. A small green light will  flash 3-4 times. This will be your only indicator that the monitor has been turned on.  Do not shower for the first 24 hours. You may shower after the first 24 hours.  Press the button if you feel a symptom. You will hear a small click. Record Date, Time and  Symptom in the Patient Logbook.  When you are ready to remove the patch, follow instructions on the last 2 pages of Patient  Logbook. Stick patch monitor onto the last page of Patient Logbook.  Place Patient Logbook in the blue and white box.  Use locking tab on box and tape box closed  securely. The blue and white box has prepaid postage on it. Please place it in the mailbox as  soon as possible. Your physician should have your test results approximately 7 days after the  monitor has been mailed back to [facility].  Call [place] at [phone] if you have questions regarding  your ZIO XT patch monitor. Call them immediately if you see an orange light blinking on your  monitor.  If your monitor falls off in less than 4 days, contact our Monitor department at [phone].  If your monitor becomes loose or falls off after 4 days call [employer] at [phone] for  suggestions on securing your monitor    Signed, Gifford Shave, MD  02/10/2022 3:01 PM    [place]

## 2022-02-10 NOTE — Patient Instructions (Signed)
Medication Instructions:  Your physician recommends that you continue on your current medications as directed. Please refer to the Current Medication list given to you today.  *If you need a refill on your cardiac medications before your next appointment, please call your pharmacy*   Lab Work: TODAY: BMET  If you have labs (blood work) drawn today and your tests are completely normal, you will receive your results only by: [place] (if you have MyChart) OR A paper copy in the mail If you have any lab test that is abnormal or we need to change your treatment, we will call you to review the results.   Testing/Procedures: Your physician has requested that you have cardiac CT. Cardiac computed tomography (CT) is a painless test that uses an x-ray machine to take clear, detailed pictures of your heart. For further information please visit [email or web address]. Please follow instruction sheet as given.   Your physician has requested that you wear a 7 day heart monitor.    Follow-Up: At [facility], you and your health needs are our priority.  As part of our continuing mission to provide you with exceptional heart care, we have created designated Provider Care Teams.  These Care Teams include your primary Cardiologist (physician) and Advanced Practice Providers (APPs -  Physician Assistants and Nurse Practitioners) who all work together to provide you with the care you need, when you need it.    Your next appointment:   3 month(s)  The format for your next appointment:   In Person  Provider:   Werner Lean, MD     Other Instructions    Your cardiac CT will be scheduled at one of the below locations:   [facility] [address] [place], [place] [address] [phone]  At [facility], please arrive at the [facility] main entrance (entrance A) of [facility] 30 minutes prior to test start time. You can use the FREE valet parking  offered at the main entrance (encouraged to control the heart rate for the test) Proceed to the [facility] Radiology Department (first floor) to check-in and test prep.  Please follow these instructions carefully (unless otherwise directed):   On the Night Before the Test: Be sure to Drink plenty of water. Do not consume any caffeinated/decaffeinated beverages or chocolate 12 hours prior to your test. Do not take any antihistamines( Flonase, Meclizine) 12 hours prior to your test.   On the Day of the Test: Drink plenty of water until 1 hour prior to the test. Do not eat any food 4 hours prior to the test. You may take your regular medications prior to the test.  Take metoprolol (Lopressor) 100 mg by mouth two hours prior to test. FEMALES- please wear underwire-free bra if available, avoid dresses & tight clothing       After the Test: Drink plenty of water. After receiving IV contrast, you may experience a mild flushed feeling. This is normal. On occasion, you may experience a mild rash up to 24 hours after the test. This is not dangerous. If this occurs, you can take Benadryl 25 mg and increase your fluid intake. If you experience trouble breathing, this can be serious. If it is severe call 911 IMMEDIATELY. If it is mild, please call our office. If you take any of these medications: Glipizide/Metformin, Avandament, Glucavance, please do not take 48 hours after completing test unless otherwise instructed.  We will call to schedule your test 2-4 weeks out  understanding that some insurance companies will need an authorization prior to the service being performed.   For non-scheduling related questions, please contact the cardiac imaging nurse navigator should you have any questions/concerns: Marchia Bond, Cardiac Imaging Nurse Navigator Gordy Clement, Cardiac Imaging Nurse Navigator [facility] Direct Office Dial: [phone]   For scheduling needs,  including cancellations and rescheduling, please call [place], [phone].     ZIO XT- Long Term Monitor Instructions  Your physician has requested you wear a ZIO patch monitor for 7 days.  This is a single patch monitor. [employer] supplies one patch monitor per enrollment. Additional stickers are not available. Please do not apply patch if you will be having a Nuclear Stress Test,  Echocardiogram, Cardiac CT, MRI, or Chest Xray during the period you would be wearing the  monitor. The patch cannot be worn during these tests. You cannot remove and re-apply the  ZIO XT patch monitor.  Your ZIO patch monitor will be mailed 3 day USPS to your address on file. It may take 3-5 days  to receive your monitor after you have been enrolled.  Once you have received your monitor, please review the enclosed instructions. Your monitor  has already been registered assigning a specific monitor serial # to you.  Billing and Patient Assistance Program Information  We have supplied [employer] with any of your insurance information on file for billing purposes. [employer] offers a sliding scale Patient Assistance Program for patients that do not have  insurance, or whose insurance does not completely cover the cost of the ZIO monitor.  You must apply for the Patient Assistance Program to qualify for this discounted rate.  To apply, please call [employer] at [phone], select option 4, select option 2, ask to apply for  Patient Assistance Program. Theodore Demark will ask your household income, and how many people  are in your household. They will quote your out-of-pocket cost based on that information.  [employer] will also be able to set up a 38-month, interest-free payment plan if needed.  Applying the monitor   Shave hair from upper left chest.  Hold abrader disc by orange tab. Rub abrader in 40 strokes over the upper left chest as  indicated in your monitor instructions.  Clean area with 4 enclosed alcohol pads. Let  dry.  Apply patch as indicated in monitor instructions. Patch will be placed under collarbone on left  side of chest with arrow pointing upward.  Rub patch adhesive wings for 2 minutes. Remove white label marked "1". Remove the white  label marked "2". Rub patch adhesive wings for 2 additional minutes.  While looking in a mirror, press and release button in center of patch. A small green light will  flash 3-4 times. This will be your only indicator that the monitor has been turned on.  Do not shower for the first 24 hours. You may shower after the first 24 hours.  Press the button if you feel a symptom. You will hear a small click. Record Date, Time and  Symptom in the Patient Logbook.  When you are ready to remove the patch, follow instructions on the last 2 pages of Patient  Logbook. Stick patch monitor onto the last page of Patient Logbook.  Place Patient Logbook in the blue and white box. Use locking tab on box and tape box closed  securely. The blue and white box has prepaid postage on it. Please place it in the mailbox as  soon as possible.  Your physician should have your test results approximately 7 days after the  monitor has been mailed back to [facility].  Call [place] at [phone] if you have questions regarding  your ZIO XT patch monitor. Call them immediately if you see an orange light blinking on your  monitor.  If your monitor falls off in less than 4 days, contact our Monitor department at [phone].  If your monitor becomes loose or falls off after 4 days call [employer] at [phone] for  suggestions on securing your monitor

## 2022-02-11 LAB — BASIC METABOLIC PANEL
BUN/Creatinine Ratio: 15 (ref 12–28)
BUN: 13 mg/dL (ref 8–27)
CO2: 21 mmol/L (ref 20–29)
Calcium: 9.9 mg/dL (ref 8.7–10.3)
Chloride: 101 mmol/L (ref 96–106)
Creatinine, Ser: 0.88 mg/dL (ref 0.57–1.00)
Glucose: 93 mg/dL (ref 70–99)
Potassium: 4.3 mmol/L (ref 3.5–5.2)
Sodium: 143 mmol/L (ref 134–144)
eGFR: 75 mL/min/{1.73_m2} (ref >59)

## 2022-02-13 DIAGNOSIS — R079 Chest pain, unspecified: Secondary | ICD-10-CM | POA: Diagnosis not present

## 2022-02-13 DIAGNOSIS — R002 Palpitations: Secondary | ICD-10-CM

## 2022-02-16 ENCOUNTER — Other Ambulatory Visit: Payer: Self-pay | Admitting: Nurse Practitioner

## 2022-02-16 ENCOUNTER — Encounter (INDEPENDENT_AMBULATORY_CARE_PROVIDER_SITE_OTHER): Payer: Self-pay | Admitting: Gastroenterology

## 2022-02-20 ENCOUNTER — Encounter: Payer: Self-pay | Admitting: Family Medicine

## 2022-02-20 ENCOUNTER — Ambulatory Visit: Payer: Medicare Other | Admitting: Family Medicine

## 2022-02-23 NOTE — Addendum Note (Signed)
Addended by: Precious Gilding on: 02/23/2022 10:53 AM   Modules accepted: Orders

## 2022-02-27 DIAGNOSIS — R002 Palpitations: Secondary | ICD-10-CM | POA: Diagnosis not present

## 2022-02-27 DIAGNOSIS — R079 Chest pain, unspecified: Secondary | ICD-10-CM | POA: Diagnosis not present

## 2022-03-04 ENCOUNTER — Encounter (INDEPENDENT_AMBULATORY_CARE_PROVIDER_SITE_OTHER): Payer: Self-pay | Admitting: Gastroenterology

## 2022-03-04 ENCOUNTER — Encounter (HOSPITAL_COMMUNITY): Payer: Self-pay

## 2022-03-04 ENCOUNTER — Other Ambulatory Visit: Payer: Self-pay

## 2022-03-04 ENCOUNTER — Ambulatory Visit (HOSPITAL_COMMUNITY)
Admission: RE | Admit: 2022-03-04 | Discharge: 2022-03-04 | Disposition: A | Discharge status: Home / Self Care | Payer: Medicare Other | Source: Ambulatory Visit | Attending: Gastroenterology | Admitting: Gastroenterology

## 2022-03-04 DIAGNOSIS — R112 Nausea with vomiting, unspecified: Secondary | ICD-10-CM | POA: Insufficient documentation

## 2022-03-04 MED ORDER — TECHNETIUM TC 99M SULFUR COLLOID
2.0000 | Freq: Once | INTRAVENOUS | Status: AC | PRN
  Administered 2022-03-04: 2.2 via ORAL

## 2022-03-05 ENCOUNTER — Telehealth: Payer: Self-pay

## 2022-03-05 ENCOUNTER — Telehealth (HOSPITAL_COMMUNITY): Payer: Self-pay | Admitting: Emergency Medicine

## 2022-03-05 ENCOUNTER — Other Ambulatory Visit (INDEPENDENT_AMBULATORY_CARE_PROVIDER_SITE_OTHER): Payer: Self-pay

## 2022-03-05 DIAGNOSIS — K3184 Gastroparesis: Secondary | ICD-10-CM

## 2022-03-05 NOTE — Telephone Encounter (Signed)
Reaching out to patient to offer assistance regarding upcoming cardiac imaging study; pt verbalizes understanding of appt date/time, parking situation and where to check in, pre-test NPO status and medications ordered, and verified current allergies; name and call back number provided for further questions should they arise ?Marchia Bond RN Navigator Cardiac Imaging ?[facility] ?[phone] office ?[phone] cell ? ?'100mg'$  metoprolol tartrate  ?Denies iv issues ?Arrival 200 ? ?

## 2022-03-05 NOTE — Telephone Encounter (Signed)
Patient has a coronary CT scheduled for 03/06/22. Will await results and clearance from Dr. Gasper Sells due to recent c/o chest pain.  ? ?Request to hold Xarelto will need to be sent to PCP who is the prescribing provider. ?

## 2022-03-05 NOTE — Telephone Encounter (Signed)
Request for Xarelto to be sent to Dr Serita Grit office ?

## 2022-03-05 NOTE — Telephone Encounter (Signed)
? ?  Pre-operative Risk Assessment  ?  ?Patient Name: [name]  ?DOB: 08-Mar-1961 ?MRN: [id number]  ? ?  ? ?Request for Surgical Clearance   ? ?Procedure:   Upper Endoscopy with Botox Injection ? ?Date of Surgery:  Clearance 03/31/22                              ?   ?Surgeon:  Dr Jenetta Downer ?Surgeon's Group or Practice Name:  [facility] ?Phone number:  [phone] ?Fax number:  [phone] ?  ?Type of Clearance Requested:   ?- Medical  ?- Pharmacy:  Hold Rivaroxaban (Xarelto) for 2 days ?  ?Type of Anesthesia:  MAC ?  ?Additional requests/questions:   None ? ?Signed, ?Walda Hertzog   ?03/05/2022, 11:56 AM  ? ?

## 2022-03-06 ENCOUNTER — Other Ambulatory Visit: Payer: Self-pay

## 2022-03-06 ENCOUNTER — Ambulatory Visit (HOSPITAL_COMMUNITY)
Admission: RE | Admit: 2022-03-06 | Discharge: 2022-03-06 | Disposition: A | Discharge status: Home / Self Care | Payer: Medicare Other | Source: Ambulatory Visit | Attending: Internal Medicine | Admitting: Internal Medicine

## 2022-03-06 DIAGNOSIS — R072 Precordial pain: Secondary | ICD-10-CM | POA: Diagnosis not present

## 2022-03-06 MED ORDER — NITROGLYCERIN 0.4 MG SL SUBL
SUBLINGUAL_TABLET | SUBLINGUAL | Status: AC
  Filled 2022-03-06: qty 1

## 2022-03-06 MED ORDER — NITROGLYCERIN 0.4 MG SL SUBL
0.4000 mg | SUBLINGUAL_TABLET | Freq: Once | SUBLINGUAL | Status: AC
  Administered 2022-03-06: 0.4 mg via SUBLINGUAL

## 2022-03-06 MED ORDER — IOHEXOL 350 MG/ML SOLN
95.0000 mL | Freq: Once | INTRAVENOUS | Status: AC | PRN
  Administered 2022-03-06: 95 mL via INTRAVENOUS

## 2022-03-06 NOTE — Progress Notes (Signed)
Pt's heart rate in the mid to high 70s and SBP in the 90-100s. Pt reports being extremely sensitive to nitroglycerin that the last time she took it resulted in an emergency. Dr. Debara Pickett notified. Per MD, for this scan, only give 0.4 mg of nitroglycerin. Discussed plan with pt and she is in agreement.  ?

## 2022-03-09 ENCOUNTER — Encounter (INDEPENDENT_AMBULATORY_CARE_PROVIDER_SITE_OTHER): Payer: Self-pay

## 2022-03-11 NOTE — Telephone Encounter (Signed)
? ? ?  Patient Name: [name]  ?DOB: [birth year] ?MRN: [id number] ? ?Primary Cardiologist: Werner Lean, MD ? ?Chart reviewed as part of pre-operative protocol coverage. Given past medical history and time since last visit, based on ACC/AHA guidelines, [name] would be at acceptable risk for the planned procedure without further cardiovascular testing.  Recent coronary CT shows no significant CAD.  Patient is cleared to proceed with upcoming procedure.  Her Xarelto is being managed by Dr. Ihor Dow in [place], will defer to Dr. Posey Pronto to decide on the holding time for Xarelto. ? ?The patient was advised that if she develops new symptoms prior to surgery to contact our office to arrange for a follow-up visit, and she verbalized understanding. ? ?I will route this recommendation to the requesting party via Epic fax function and remove from pre-op pool. ? ?Please call with questions. ? ?Almyra Deforest, [place] ?03/11/2022, 3:04 PM ? ?

## 2022-03-17 ENCOUNTER — Other Ambulatory Visit: Payer: Self-pay | Admitting: Family Medicine

## 2022-03-17 ENCOUNTER — Encounter: Payer: Self-pay | Admitting: Family Medicine

## 2022-03-17 ENCOUNTER — Encounter (INDEPENDENT_AMBULATORY_CARE_PROVIDER_SITE_OTHER): Payer: Self-pay | Admitting: Gastroenterology

## 2022-03-17 ENCOUNTER — Other Ambulatory Visit (INDEPENDENT_AMBULATORY_CARE_PROVIDER_SITE_OTHER): Payer: Self-pay | Admitting: Gastroenterology

## 2022-03-17 DIAGNOSIS — R42 Dizziness and giddiness: Secondary | ICD-10-CM

## 2022-03-17 DIAGNOSIS — K3184 Gastroparesis: Secondary | ICD-10-CM

## 2022-03-18 ENCOUNTER — Telehealth: Payer: Self-pay | Admitting: Family Medicine

## 2022-03-18 ENCOUNTER — Other Ambulatory Visit: Payer: Self-pay | Admitting: Family Medicine

## 2022-03-18 DIAGNOSIS — Z7901 Long term (current) use of anticoagulants: Secondary | ICD-10-CM

## 2022-03-18 MED ORDER — ALPRAZOLAM 1 MG PO TABS: 1.0000 mg | ORAL_TABLET | Freq: Two times a day (BID) | ORAL | 1 refills | Status: DC

## 2022-03-18 NOTE — Telephone Encounter (Signed)
Patient called gastro dr and they order labs.  She scheduled follow up with dr Moshe Cipro on 05.03.2023 ?

## 2022-03-18 NOTE — Telephone Encounter (Signed)
Call already sent through staff message ?

## 2022-03-18 NOTE — Telephone Encounter (Signed)
Please see simpsons message and sch patient with [facility] or another provider if she chooses ?

## 2022-03-19 ENCOUNTER — Telehealth (INDEPENDENT_AMBULATORY_CARE_PROVIDER_SITE_OTHER): Payer: Self-pay

## 2022-03-19 NOTE — Telephone Encounter (Signed)
Thanks so much, yes will need this brindging ?

## 2022-03-19 NOTE — Telephone Encounter (Signed)
03/18/22 Patient has a h/o pulmonary embolus & DVT she will need to be bridged to alternate anitcoagulant for her procedure I will ask Cardiology to help with bridging her Dr Ihor Dow  ?

## 2022-03-22 ENCOUNTER — Telehealth: Payer: Self-pay | Admitting: Family Medicine

## 2022-03-22 NOTE — Telephone Encounter (Signed)
She can hold xarelto for 3 days prior to procedure and resume as soon as possible post procedure ?Pls also refill xarelto x 2 ?

## 2022-03-22 NOTE — Telephone Encounter (Signed)
Pls refill xarelto x 2 ?Also send stamped letter to GI, Dr Alfonso Ramus, Jairo Ben that pt can hold xarelto ?

## 2022-03-25 ENCOUNTER — Other Ambulatory Visit: Payer: Self-pay

## 2022-03-25 DIAGNOSIS — Z86711 Personal history of pulmonary embolism: Secondary | ICD-10-CM

## 2022-03-25 MED ORDER — XARELTO 20 MG PO TABS: ORAL_TABLET | ORAL | 1 refills | Status: DC

## 2022-03-25 NOTE — Telephone Encounter (Signed)
Stamped letter sent to dr Grayland Ormond  ?

## 2022-03-25 NOTE — Patient Instructions (Addendum)
? ? ? ?Upper Endoscopy, Adult, Care After ?This sheet gives you information about how to care for yourself after your procedure. Your health care provider may also give you more specific instructions. If you have problems or questions, contact your health care provider. ?What can I expect after the procedure? ?After the procedure, it is common to have: ?A sore throat. ?Mild stomach pain or discomfort. ?Bloating. ?Nausea. ?Follow these instructions at home: ? ?Follow instructions from your health care provider about what to eat or drink after your procedure. ?Return to your normal activities as told by your health care provider. Ask your health care provider what activities are safe for you. ?Take over-the-counter and prescription medicines only as told by your health care provider. ?If you were given a sedative during the procedure, it can affect you for several hours. Do not drive or operate machinery until your health care provider says that it is safe. ?Keep all follow-up visits as told by your health care provider. This is important. ?Contact a health care provider if you have: ?A sore throat that lasts longer than one day. ?Trouble swallowing. ?Get help right away if: ?You vomit blood or your vomit looks like coffee grounds. ?You have: ?A fever. ?Bloody, black, or tarry stools. ?A severe sore throat or you cannot swallow. ?Difficulty breathing. ?Severe pain in your chest or abdomen. ?Summary ?After the procedure, it is common to have a sore throat, mild stomach discomfort, bloating, and nausea. ?If you were given a sedative during the procedure, it can affect you for several hours. Do not drive or operate machinery until your health care provider says that it is safe. ?Follow instructions from your health care provider about what to eat or drink after your procedure. ?Return to your normal activities as told by your health care provider. ?This information is not intended to replace advice given to you by your  health care provider. Make sure you discuss any questions you have with your health care provider. ?Document Revised: 10/20/2019 Document Reviewed: 05/16/2018 ?Elsevier Patient Education ? [place]. ?Monitored Anesthesia Care, Care After ?This sheet gives you information about how to care for yourself after your procedure. Your health care provider may also give you more specific instructions. If you have problems or questions, contact your health care provider. ?What can I expect after the procedure? ?After the procedure, it is common to have: ?Tiredness. ?Forgetfulness about what happened after the procedure. ?Impaired judgment for important decisions. ?Nausea or vomiting. ?Some difficulty with balance. ?Follow these instructions at home: ?For the time period you were told by your health care provider: ?  ?Rest as needed. ?Do not participate in activities where you could fall or become injured. ?Do not drive or use machinery. ?Do not drink alcohol. ?Do not take sleeping pills or medicines that cause drowsiness. ?Do not make important decisions or sign legal documents. ?Do not take care of children on your own. ?Eating and drinking ?Follow the diet that is recommended by your health care provider. ?Drink enough fluid to keep your urine pale yellow. ?If you vomit: ?Drink water, juice, or soup when you can drink without vomiting. ?Make sure you have little or no nausea before eating solid foods. ?General instructions ?Have a responsible adult stay with you for the time you are told. It is important to have someone help care for you until you are awake and alert. ?Take over-the-counter and prescription medicines only as told by your health care provider. ?If you have sleep apnea,  surgery and certain medicines can increase your risk for breathing problems. Follow instructions from your health care provider about wearing your sleep device: ?Anytime you are sleeping, including during daytime naps. ?While taking  prescription pain medicines, sleeping medicines, or medicines that make you drowsy. ?Avoid smoking. ?Keep all follow-up visits as told by your health care provider. This is important. ?Contact a health care provider if: ?You keep feeling nauseous or you keep vomiting. ?You feel light-headed. ?You are still sleepy or having trouble with balance after 24 hours. ?You develop a rash. ?You have a fever. ?You have redness or swelling around the IV site. ?Get help right away if: ?You have trouble breathing. ?You have new-onset confusion at home. ?Summary ?For several hours after your procedure, you may feel tired. You may also be forgetful and have poor judgment. ?Have a responsible adult stay with you for the time you are told. It is important to have someone help care for you until you are awake and alert. ?Rest as told. Do not drive or operate machinery. Do not drink alcohol or take sleeping pills. ?Get help right away if you have trouble breathing, or if you suddenly become confused. ?This information is not intended to replace advice given to you by your health care provider. Make sure you discuss any questions you have with your health care provider. ?Document Revised: 08/29/2020 Document Reviewed: 11/16/2019 ?Elsevier Patient Education ? 2022 [place]. ? ? ? [name] ? 03/25/2022  ?  ? '@PREFPERIOPPHARMACY'$ @ ? ? Your procedure is scheduled on  03/31/2022. ? ? Report to [facility] at  0830 A.M. ? ? Call this number if you have problems the morning of surgery: ? [phone] ? ? Remember: ? Follow the diet instructions given to you by the office. ? ?. ?                                Your last dose of xarelto should be on 03/28/2022. ? ?  ? Take these medicines the morning of surgery with A SIP OF WATER  ? ? ?            xanax(if needed), fioricet(if needed), flexeril(if needed), cymbalta,dilaudid(if needed), levothyroxine, antivert(if needed), metoprolol, prilosec. ?  ? ? Do not wear jewelry, make-up or  nail polish. ? Do not wear lotions, powders, or perfumes, or deodorant. ? Do not shave 48 hours prior to surgery.  Men may shave face and neck. ? Do not bring valuables to the hospital. ? [facility] is not responsible for any belongings or valuables. ? ?Contacts, dentures or bridgework may not be worn into surgery.  Leave your suitcase in the car.  After surgery it may be brought to your room. ? ?For patients admitted to the hospital, discharge time will be determined by your treatment team. ? ?Patients discharged the day of surgery will not be allowed to drive home and must have someone with them for 24 hours.  ? ? ?Special instructions:   DO NOT smoke tobacco or vape for 24 hours before your procedure. ? ?Please read over the following fact sheets that you were given. ?Anesthesia Post-op Instructions and Care and Recovery After Surgery ?  ? ? ?  ?

## 2022-03-26 ENCOUNTER — Encounter (HOSPITAL_COMMUNITY): Payer: Self-pay

## 2022-03-26 ENCOUNTER — Encounter (HOSPITAL_COMMUNITY)
Admission: RE | Admit: 2022-03-26 | Discharge: 2022-03-26 | Disposition: A | Discharge status: Home / Self Care | Payer: Medicare Other | Source: Ambulatory Visit | Attending: Gastroenterology | Admitting: Gastroenterology

## 2022-03-26 ENCOUNTER — Other Ambulatory Visit: Payer: Self-pay

## 2022-03-26 VITALS — BP 112/51 | HR 101 | Temp 98.7°F | Resp 18 | Ht 64.0 in | Wt 182.0 lb

## 2022-03-26 DIAGNOSIS — E039 Hypothyroidism, unspecified: Secondary | ICD-10-CM | POA: Diagnosis not present

## 2022-03-26 DIAGNOSIS — K2289 Other specified disease of esophagus: Secondary | ICD-10-CM | POA: Diagnosis not present

## 2022-03-26 DIAGNOSIS — Z803 Family history of malignant neoplasm of breast: Secondary | ICD-10-CM | POA: Diagnosis not present

## 2022-03-26 DIAGNOSIS — K3184 Gastroparesis: Secondary | ICD-10-CM | POA: Diagnosis not present

## 2022-03-26 DIAGNOSIS — K219 Gastro-esophageal reflux disease without esophagitis: Secondary | ICD-10-CM | POA: Diagnosis not present

## 2022-03-26 DIAGNOSIS — E876 Hypokalemia: Secondary | ICD-10-CM | POA: Insufficient documentation

## 2022-03-26 DIAGNOSIS — E785 Hyperlipidemia, unspecified: Secondary | ICD-10-CM | POA: Diagnosis not present

## 2022-03-26 DIAGNOSIS — R1032 Left lower quadrant pain: Secondary | ICD-10-CM | POA: Diagnosis not present

## 2022-03-26 DIAGNOSIS — Z95828 Presence of other vascular implants and grafts: Secondary | ICD-10-CM | POA: Diagnosis not present

## 2022-03-26 DIAGNOSIS — M797 Fibromyalgia: Secondary | ICD-10-CM | POA: Diagnosis not present

## 2022-03-26 DIAGNOSIS — M79605 Pain in left leg: Secondary | ICD-10-CM | POA: Diagnosis not present

## 2022-03-26 DIAGNOSIS — I4891 Unspecified atrial fibrillation: Secondary | ICD-10-CM | POA: Diagnosis not present

## 2022-03-26 DIAGNOSIS — F411 Generalized anxiety disorder: Secondary | ICD-10-CM | POA: Diagnosis not present

## 2022-03-26 DIAGNOSIS — Z7984 Long term (current) use of oral hypoglycemic drugs: Secondary | ICD-10-CM | POA: Diagnosis not present

## 2022-03-26 DIAGNOSIS — E860 Dehydration: Secondary | ICD-10-CM | POA: Diagnosis not present

## 2022-03-26 DIAGNOSIS — Z01812 Encounter for preprocedural laboratory examination: Secondary | ICD-10-CM | POA: Insufficient documentation

## 2022-03-26 DIAGNOSIS — E119 Type 2 diabetes mellitus without complications: Secondary | ICD-10-CM | POA: Insufficient documentation

## 2022-03-26 DIAGNOSIS — Z885 Allergy status to narcotic agent status: Secondary | ICD-10-CM | POA: Diagnosis not present

## 2022-03-26 DIAGNOSIS — Z79899 Other long term (current) drug therapy: Secondary | ICD-10-CM | POA: Diagnosis not present

## 2022-03-26 DIAGNOSIS — M79604 Pain in right leg: Secondary | ICD-10-CM | POA: Diagnosis not present

## 2022-03-26 DIAGNOSIS — F1120 Opioid dependence, uncomplicated: Secondary | ICD-10-CM | POA: Diagnosis not present

## 2022-03-26 DIAGNOSIS — R110 Nausea: Secondary | ICD-10-CM | POA: Diagnosis not present

## 2022-03-26 DIAGNOSIS — K31A19 Gastric intestinal metaplasia without dysplasia, unspecified site: Secondary | ICD-10-CM | POA: Diagnosis not present

## 2022-03-26 DIAGNOSIS — K2980 Duodenitis without bleeding: Secondary | ICD-10-CM | POA: Diagnosis not present

## 2022-03-26 DIAGNOSIS — R55 Syncope and collapse: Secondary | ICD-10-CM | POA: Diagnosis not present

## 2022-03-26 DIAGNOSIS — D509 Iron deficiency anemia, unspecified: Secondary | ICD-10-CM | POA: Insufficient documentation

## 2022-03-26 DIAGNOSIS — Z86718 Personal history of other venous thrombosis and embolism: Secondary | ICD-10-CM | POA: Diagnosis not present

## 2022-03-26 DIAGNOSIS — E669 Obesity, unspecified: Secondary | ICD-10-CM | POA: Diagnosis not present

## 2022-03-26 DIAGNOSIS — K565 Intestinal adhesions [bands], unspecified as to partial versus complete obstruction: Secondary | ICD-10-CM | POA: Diagnosis not present

## 2022-03-26 DIAGNOSIS — R112 Nausea with vomiting, unspecified: Secondary | ICD-10-CM | POA: Diagnosis not present

## 2022-03-26 DIAGNOSIS — Z7901 Long term (current) use of anticoagulants: Secondary | ICD-10-CM | POA: Diagnosis not present

## 2022-03-26 DIAGNOSIS — D649 Anemia, unspecified: Secondary | ICD-10-CM | POA: Insufficient documentation

## 2022-03-26 DIAGNOSIS — Z7982 Long term (current) use of aspirin: Secondary | ICD-10-CM | POA: Diagnosis not present

## 2022-03-26 DIAGNOSIS — K5989 Other specified functional intestinal disorders: Secondary | ICD-10-CM | POA: Diagnosis not present

## 2022-03-26 DIAGNOSIS — I10 Essential (primary) hypertension: Secondary | ICD-10-CM | POA: Diagnosis not present

## 2022-03-26 DIAGNOSIS — K529 Noninfective gastroenteritis and colitis, unspecified: Secondary | ICD-10-CM | POA: Diagnosis not present

## 2022-03-26 DIAGNOSIS — K317 Polyp of stomach and duodenum: Secondary | ICD-10-CM | POA: Diagnosis not present

## 2022-03-26 DIAGNOSIS — K56609 Unspecified intestinal obstruction, unspecified as to partial versus complete obstruction: Secondary | ICD-10-CM | POA: Diagnosis not present

## 2022-03-26 DIAGNOSIS — Z86711 Personal history of pulmonary embolism: Secondary | ICD-10-CM | POA: Diagnosis not present

## 2022-03-26 LAB — COMPREHENSIVE METABOLIC PANEL
ALT: 80 U/L — ABNORMAL HIGH (ref 0–44)
AST: 51 U/L — ABNORMAL HIGH (ref 15–41)
Albumin: 4.2 g/dL (ref 3.5–5.0)
Alkaline Phosphatase: 140 U/L — ABNORMAL HIGH (ref 38–126)
Anion gap: 11 (ref 5–15)
BUN: 18 mg/dL (ref 6–20)
CO2: 23 mmol/L (ref 22–32)
Calcium: 9.9 mg/dL (ref 8.9–10.3)
Chloride: 104 mmol/L (ref 98–111)
Creatinine, Ser: 0.72 mg/dL (ref 0.44–1.00)
GFR, Estimated: >60 mL/min (ref >60)
Glucose, Bld: 111 mg/dL — ABNORMAL HIGH (ref 70–99)
Potassium: 3.5 mmol/L (ref 3.5–5.1)
Sodium: 138 mmol/L (ref 135–145)
Total Bilirubin: 0.7 mg/dL (ref 0.3–1.2)
Total Protein: 7.3 g/dL (ref 6.5–8.1)

## 2022-03-26 LAB — CBC WITH DIFFERENTIAL/PLATELET
Abs Immature Granulocytes: 0.02 10*3/uL (ref 0.00–0.07)
Basophils Absolute: 0 10*3/uL (ref 0.0–0.1)
Basophils Relative: 1 %
Eosinophils Absolute: 0.1 10*3/uL (ref 0.0–0.5)
Eosinophils Relative: 2 %
HCT: 50.1 % — ABNORMAL HIGH (ref 36.0–46.0)
Hemoglobin: 16.4 g/dL — ABNORMAL HIGH (ref 12.0–15.0)
Immature Granulocytes: 0 %
Lymphocytes Relative: 27 %
Lymphs Abs: 2.3 10*3/uL (ref 0.7–4.0)
MCH: 29 pg (ref 26.0–34.0)
MCHC: 32.7 g/dL (ref 30.0–36.0)
MCV: 88.7 fL (ref 80.0–100.0)
Monocytes Absolute: 0.5 10*3/uL (ref 0.1–1.0)
Monocytes Relative: 6 %
Neutro Abs: 5.5 10*3/uL (ref 1.7–7.7)
Neutrophils Relative %: 64 %
Platelets: 268 10*3/uL (ref 150–400)
RBC: 5.65 MIL/uL — ABNORMAL HIGH (ref 3.87–5.11)
RDW: 13.7 % (ref 11.5–15.5)
WBC: 8.5 10*3/uL (ref 4.0–10.5)
nRBC: 0 % (ref 0.0–0.2)

## 2022-03-26 LAB — MAGNESIUM: Magnesium: 1.9 mg/dL (ref 1.7–2.4)

## 2022-03-26 LAB — PHOSPHORUS: Phosphorus: 3.6 mg/dL (ref 2.5–4.6)

## 2022-03-26 MED ORDER — ONABOTULINUMTOXINA 100 UNITS IJ SOLR: 100.0000 [IU] | Freq: Once | INTRAMUSCULAR | Status: DC

## 2022-03-28 ENCOUNTER — Inpatient Hospital Stay (HOSPITAL_COMMUNITY)
Admission: EM | Admit: 2022-03-28 | Discharge: 2022-04-02 | DRG: 641 | Disposition: A | Discharge status: Home / Self Care | Payer: Medicare Other | Attending: Internal Medicine | Admitting: Internal Medicine

## 2022-03-28 ENCOUNTER — Encounter (HOSPITAL_COMMUNITY): Payer: Self-pay | Admitting: Emergency Medicine

## 2022-03-28 ENCOUNTER — Encounter (INDEPENDENT_AMBULATORY_CARE_PROVIDER_SITE_OTHER): Payer: Self-pay | Admitting: Gastroenterology

## 2022-03-28 ENCOUNTER — Other Ambulatory Visit: Payer: Self-pay

## 2022-03-28 ENCOUNTER — Emergency Department (HOSPITAL_COMMUNITY): Payer: Medicare Other

## 2022-03-28 DIAGNOSIS — K56609 Unspecified intestinal obstruction, unspecified as to partial versus complete obstruction: Secondary | ICD-10-CM | POA: Diagnosis not present

## 2022-03-28 DIAGNOSIS — Z981 Arthrodesis status: Secondary | ICD-10-CM

## 2022-03-28 DIAGNOSIS — Z803 Family history of malignant neoplasm of breast: Secondary | ICD-10-CM

## 2022-03-28 DIAGNOSIS — Z853 Personal history of malignant neoplasm of breast: Secondary | ICD-10-CM

## 2022-03-28 DIAGNOSIS — Z7982 Long term (current) use of aspirin: Secondary | ICD-10-CM

## 2022-03-28 DIAGNOSIS — Z6830 Body mass index (BMI) 30.0-30.9, adult: Secondary | ICD-10-CM

## 2022-03-28 DIAGNOSIS — Z95828 Presence of other vascular implants and grafts: Secondary | ICD-10-CM

## 2022-03-28 DIAGNOSIS — Z8349 Family history of other endocrine, nutritional and metabolic diseases: Secondary | ICD-10-CM

## 2022-03-28 DIAGNOSIS — Z800 Family history of malignant neoplasm of digestive organs: Secondary | ICD-10-CM

## 2022-03-28 DIAGNOSIS — K565 Intestinal adhesions [bands], unspecified as to partial versus complete obstruction: Secondary | ICD-10-CM | POA: Diagnosis present

## 2022-03-28 DIAGNOSIS — F411 Generalized anxiety disorder: Secondary | ICD-10-CM | POA: Diagnosis present

## 2022-03-28 DIAGNOSIS — Z881 Allergy status to other antibiotic agents status: Secondary | ICD-10-CM

## 2022-03-28 DIAGNOSIS — E669 Obesity, unspecified: Secondary | ICD-10-CM | POA: Diagnosis present

## 2022-03-28 DIAGNOSIS — E876 Hypokalemia: Secondary | ICD-10-CM | POA: Diagnosis present

## 2022-03-28 DIAGNOSIS — M797 Fibromyalgia: Secondary | ICD-10-CM | POA: Diagnosis present

## 2022-03-28 DIAGNOSIS — Z885 Allergy status to narcotic agent status: Secondary | ICD-10-CM

## 2022-03-28 DIAGNOSIS — Z86718 Personal history of other venous thrombosis and embolism: Secondary | ICD-10-CM | POA: Diagnosis not present

## 2022-03-28 DIAGNOSIS — Z7984 Long term (current) use of oral hypoglycemic drugs: Secondary | ICD-10-CM

## 2022-03-28 DIAGNOSIS — K529 Noninfective gastroenteritis and colitis, unspecified: Secondary | ICD-10-CM | POA: Diagnosis not present

## 2022-03-28 DIAGNOSIS — Z90710 Acquired absence of both cervix and uterus: Secondary | ICD-10-CM

## 2022-03-28 DIAGNOSIS — I10 Essential (primary) hypertension: Secondary | ICD-10-CM | POA: Diagnosis present

## 2022-03-28 DIAGNOSIS — Z7901 Long term (current) use of anticoagulants: Secondary | ICD-10-CM

## 2022-03-28 DIAGNOSIS — K5989 Other specified functional intestinal disorders: Secondary | ICD-10-CM | POA: Diagnosis present

## 2022-03-28 DIAGNOSIS — Z833 Family history of diabetes mellitus: Secondary | ICD-10-CM

## 2022-03-28 DIAGNOSIS — Z9049 Acquired absence of other specified parts of digestive tract: Secondary | ICD-10-CM

## 2022-03-28 DIAGNOSIS — Z86711 Personal history of pulmonary embolism: Secondary | ICD-10-CM | POA: Diagnosis present

## 2022-03-28 DIAGNOSIS — Z79899 Other long term (current) drug therapy: Secondary | ICD-10-CM

## 2022-03-28 DIAGNOSIS — F1120 Opioid dependence, uncomplicated: Secondary | ICD-10-CM

## 2022-03-28 DIAGNOSIS — Z808 Family history of malignant neoplasm of other organs or systems: Secondary | ICD-10-CM

## 2022-03-28 DIAGNOSIS — R112 Nausea with vomiting, unspecified: Secondary | ICD-10-CM

## 2022-03-28 DIAGNOSIS — K3184 Gastroparesis: Secondary | ICD-10-CM | POA: Diagnosis present

## 2022-03-28 DIAGNOSIS — Z9012 Acquired absence of left breast and nipple: Secondary | ICD-10-CM

## 2022-03-28 DIAGNOSIS — M7918 Myalgia, other site: Secondary | ICD-10-CM

## 2022-03-28 DIAGNOSIS — Z8249 Family history of ischemic heart disease and other diseases of the circulatory system: Secondary | ICD-10-CM

## 2022-03-28 DIAGNOSIS — K219 Gastro-esophageal reflux disease without esophagitis: Secondary | ICD-10-CM | POA: Diagnosis present

## 2022-03-28 DIAGNOSIS — R55 Syncope and collapse: Secondary | ICD-10-CM | POA: Diagnosis not present

## 2022-03-28 DIAGNOSIS — K317 Polyp of stomach and duodenum: Secondary | ICD-10-CM | POA: Diagnosis present

## 2022-03-28 DIAGNOSIS — E860 Dehydration: Principal | ICD-10-CM | POA: Diagnosis present

## 2022-03-28 DIAGNOSIS — Z888 Allergy status to other drugs, medicaments and biological substances status: Secondary | ICD-10-CM

## 2022-03-28 DIAGNOSIS — Z8041 Family history of malignant neoplasm of ovary: Secondary | ICD-10-CM

## 2022-03-28 DIAGNOSIS — Z8673 Personal history of transient ischemic attack (TIA), and cerebral infarction without residual deficits: Secondary | ICD-10-CM

## 2022-03-28 DIAGNOSIS — I4891 Unspecified atrial fibrillation: Secondary | ICD-10-CM | POA: Diagnosis present

## 2022-03-28 DIAGNOSIS — E785 Hyperlipidemia, unspecified: Secondary | ICD-10-CM | POA: Diagnosis present

## 2022-03-28 LAB — CBC WITH DIFFERENTIAL/PLATELET
Abs Immature Granulocytes: 0.02 10*3/uL (ref 0.00–0.07)
Basophils Absolute: 0 10*3/uL (ref 0.0–0.1)
Basophils Relative: 1 %
Eosinophils Absolute: 0.1 10*3/uL (ref 0.0–0.5)
Eosinophils Relative: 1 %
HCT: 48.7 % — ABNORMAL HIGH (ref 36.0–46.0)
Hemoglobin: 16.5 g/dL — ABNORMAL HIGH (ref 12.0–15.0)
Immature Granulocytes: 0 %
Lymphocytes Relative: 27 %
Lymphs Abs: 2.2 10*3/uL (ref 0.7–4.0)
MCH: 29.1 pg (ref 26.0–34.0)
MCHC: 33.9 g/dL (ref 30.0–36.0)
MCV: 85.9 fL (ref 80.0–100.0)
Monocytes Absolute: 0.6 10*3/uL (ref 0.1–1.0)
Monocytes Relative: 7 %
Neutro Abs: 5.1 10*3/uL (ref 1.7–7.7)
Neutrophils Relative %: 64 %
Platelets: 251 10*3/uL (ref 150–400)
RBC: 5.67 MIL/uL — ABNORMAL HIGH (ref 3.87–5.11)
RDW: 13.8 % (ref 11.5–15.5)
WBC: 8.1 10*3/uL (ref 4.0–10.5)
nRBC: 0 % (ref 0.0–0.2)

## 2022-03-28 LAB — URINALYSIS, ROUTINE W REFLEX MICROSCOPIC
Bilirubin Urine: NEGATIVE
Glucose, UA: NEGATIVE mg/dL
Hgb urine dipstick: NEGATIVE
Ketones, ur: NEGATIVE mg/dL
Leukocytes,Ua: NEGATIVE
Nitrite: NEGATIVE
Protein, ur: NEGATIVE mg/dL
Specific Gravity, Urine: 1.023 (ref 1.005–1.030)
pH: 6 (ref 5.0–8.0)

## 2022-03-28 LAB — COMPREHENSIVE METABOLIC PANEL
ALT: 71 U/L — ABNORMAL HIGH (ref 0–44)
AST: 61 U/L — ABNORMAL HIGH (ref 15–41)
Albumin: 4 g/dL (ref 3.5–5.0)
Alkaline Phosphatase: 137 U/L — ABNORMAL HIGH (ref 38–126)
Anion gap: 10 (ref 5–15)
BUN: 21 mg/dL — ABNORMAL HIGH (ref 6–20)
CO2: 19 mmol/L — ABNORMAL LOW (ref 22–32)
Calcium: 9.5 mg/dL (ref 8.9–10.3)
Chloride: 107 mmol/L (ref 98–111)
Creatinine, Ser: 0.84 mg/dL (ref 0.44–1.00)
GFR, Estimated: >60 mL/min (ref >60)
Glucose, Bld: 124 mg/dL — ABNORMAL HIGH (ref 70–99)
Potassium: 3.3 mmol/L — ABNORMAL LOW (ref 3.5–5.1)
Sodium: 136 mmol/L (ref 135–145)
Total Bilirubin: 0.7 mg/dL (ref 0.3–1.2)
Total Protein: 7.1 g/dL (ref 6.5–8.1)

## 2022-03-28 LAB — TROPONIN I (HIGH SENSITIVITY)
Troponin I (High Sensitivity): 2 ng/L (ref <18)
Troponin I (High Sensitivity): 3 ng/L (ref <18)

## 2022-03-28 LAB — CBG MONITORING, ED: Glucose-Capillary: 96 mg/dL (ref 70–99)

## 2022-03-28 LAB — LIPASE, BLOOD: Lipase: 28 U/L (ref 11–51)

## 2022-03-28 LAB — BRAIN NATRIURETIC PEPTIDE: B Natriuretic Peptide: 11 pg/mL (ref 0.0–100.0)

## 2022-03-28 LAB — MAGNESIUM: Magnesium: 1.7 mg/dL (ref 1.7–2.4)

## 2022-03-28 MED ORDER — ONDANSETRON HCL 4 MG/2ML IJ SOLN
4.0000 mg | Freq: Once | INTRAMUSCULAR | Status: AC
Start: 2022-03-28 — End: 2022-03-28
  Administered 2022-03-28: 4 mg via INTRAVENOUS
  Filled 2022-03-28: qty 2

## 2022-03-28 MED ORDER — MORPHINE SULFATE (PF) 4 MG/ML IV SOLN
4.0000 mg | Freq: Once | INTRAVENOUS | Status: AC
  Administered 2022-03-28: 4 mg via INTRAVENOUS
  Filled 2022-03-28: qty 1

## 2022-03-28 MED ORDER — DICYCLOMINE HCL 10 MG PO CAPS
10.0000 mg | ORAL_CAPSULE | Freq: Once | ORAL | Status: AC
Start: 2022-03-28 — End: 2022-03-28
  Administered 2022-03-28: 10 mg via ORAL
  Filled 2022-03-28: qty 1

## 2022-03-28 MED ORDER — HYDROMORPHONE HCL 1 MG/ML IJ SOLN
1.0000 mg | Freq: Four times a day (QID) | INTRAMUSCULAR | Status: DC | PRN
Start: 2022-03-28 — End: 2022-03-29
  Administered 2022-03-29: 1 mg via INTRAVENOUS
  Filled 2022-03-28: qty 1

## 2022-03-28 MED ORDER — SODIUM CHLORIDE 0.9 % IV BOLUS
1000.0000 mL | Freq: Once | INTRAVENOUS | Status: AC
  Administered 2022-03-28: 1000 mL via INTRAVENOUS

## 2022-03-28 MED ORDER — IOHEXOL 350 MG/ML SOLN
100.0000 mL | Freq: Once | INTRAVENOUS | Status: AC | PRN
  Administered 2022-03-28: 100 mL via INTRAVENOUS

## 2022-03-28 MED ORDER — LACTATED RINGERS IV SOLN: INTRAVENOUS | Status: DC

## 2022-03-28 MED ORDER — FAMOTIDINE IN NACL 20-0.9 MG/50ML-% IV SOLN
20.0000 mg | Freq: Once | INTRAVENOUS | Status: AC
  Administered 2022-03-28: 20 mg via INTRAVENOUS
  Filled 2022-03-28: qty 50

## 2022-03-28 MED ORDER — POTASSIUM CHLORIDE CRYS ER 20 MEQ PO TBCR
40.0000 meq | EXTENDED_RELEASE_TABLET | Freq: Once | ORAL | Status: AC
  Administered 2022-03-28: 40 meq via ORAL
  Filled 2022-03-28: qty 2

## 2022-03-28 MED ORDER — ENOXAPARIN SODIUM 40 MG/0.4ML IJ SOSY
40.0000 mg | PREFILLED_SYRINGE | INTRAMUSCULAR | Status: DC
  Administered 2022-03-29: 40 mg via SUBCUTANEOUS
  Filled 2022-03-28: qty 0.4

## 2022-03-28 MED ORDER — ONDANSETRON HCL 4 MG PO TABS: 4.0000 mg | ORAL_TABLET | Freq: Four times a day (QID) | ORAL | Status: DC | PRN

## 2022-03-28 MED ORDER — LACTATED RINGERS IV BOLUS
1000.0000 mL | Freq: Once | INTRAVENOUS | Status: AC
Start: 2022-03-28 — End: 2022-03-28
  Administered 2022-03-28: 1000 mL via INTRAVENOUS

## 2022-03-28 MED ORDER — POTASSIUM CHLORIDE IN NACL 20-0.9 MEQ/L-% IV SOLN
INTRAVENOUS | Status: DC
  Filled 2022-03-28: qty 1000

## 2022-03-28 MED ORDER — ONDANSETRON HCL 4 MG/2ML IJ SOLN
4.0000 mg | Freq: Four times a day (QID) | INTRAMUSCULAR | Status: DC | PRN
  Administered 2022-03-30 – 2022-04-01 (×4): 4 mg via INTRAVENOUS
  Filled 2022-03-28 (×4): qty 2

## 2022-03-28 NOTE — Assessment & Plan Note (Addendum)
Complicated bowel history including multiple abdominal surgeries-see HPI above.  Presenting with vomiting, right sided and lower abdominal cramps, chronic diarrhea unchanged.  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 ?--Dr. Arnoldo Morale consulted and recommended advancing diet as tolerated.  Pt having multiple bowel movements.  ?--continue nonoperative management ?-pt is having BMs last 3 days prior to d/c and tolerating soft diet ?

## 2022-03-28 NOTE — Assessment & Plan Note (Addendum)
In the setting of vomiting, chronic diarrhea, SBO, and dehydration.  Patient reports blood pressure down to 70s and 80s at home. Blood pressure soft In ED.  Similar hospitalization 11/2021 with vomiting, dehydration and syncope. ?-  CTA negative for PE.  EKG, troponins unremarkable. ?-Last echo 10/2021-EF 60 to 65%, normal LV diastolic parameters, no significant valvular abnormalities. ?-Pt has been ambulating halls.  ?

## 2022-03-28 NOTE — ED Notes (Signed)
Report given to receiving RN on 300.  ?

## 2022-03-28 NOTE — ED Provider Notes (Signed)
?[place] ?Provider Note ? ? ?CSN: [id number] ?Arrival date & time: 03/28/22  1602 ? ?  ? ?History ? ?Chief Complaint  ?Patient presents with  ? Near Syncope  ? ? ?[name] is a 61 y.o. female. ? ?This is a 61 y.o. female with significant medical history as below, including gastroparesis, recurrent syncope, MDD, irregular heart rate, PE on DOAC who presents to the ED with complaint of syncope, abdominal pain.  ? ?Patient reports symptoms ongoing for around 3 weeks she has history of recurrent syncope that occurs when standing up quickly, orthostatic. She reports over the past 2 to 3 weeks for oral intake secondary to her underlying gastroparesis.  Occasional vomiting, has some nausea and pain after eating. Peri-umbilical cramping. She also has chronic diarrhea unchanged baseline.  No melena or bright blood per rectum.  She is scheduled for EGD on April 4.  She has been compliant with her DOAC, took it this morning.  No dyspnea or chest pain. ? ?She experiences episodes of syncope when she stands up quickly.  Last episode was this morning when she stood up to quickly into the phone while she was asleep.  She fell to the ground.  No LOC.  She felt lightheaded, diaphoretic, perioral numbness, tunnel vision.  Some dyspnea when standing up quickly with these episodes. No prodromal chest pain or palpitations, no nausea or vomiting.  No seizure activity.  Return to baseline after episode.  No postictal period ? ? ? ?Past Medical History: ?08/14/2019: Abdominal hernia ?06/06/2019: Abdominal pain ?05/01/2013: Acute bronchitis ?06/09/2010: Acute cystitis ?    Comment:  Qualifier: Diagnosis of  By: Cori Razor LPN, Brandi   ?08/28/2011: Acute renal insufficiency ?No date: Allergy ?    Comment:  Phreesia 03/16/2021 ?No date: Anemia ?    Comment:  Phreesia 10/29/2020 ?No date: Anxiety ?03/16/2008: Anxiety state ?    Comment:  Qualifier: Diagnosis of  By: Dierdre Harness   ?No date: Arthritis ?    Comment:   Phreesia 10/29/2020 ?07/17/2014: Back pain with radiation ?07/27/2016: Bilateral hand pain ?No date: Blood transfusion without reported diagnosis ?    Comment:  Phreesia 10/29/2020 ?No date: Breast cancer ([place]) ?    Comment:  L breast- 2006 ?No date: Cancer [facility]) ?    Comment:  Phreesia 10/29/2020 ?No date: Carpal tunnel syndrome ?No date: Chronic constipation ?No date: Chronic pain syndrome ?    Comment:  followed by [facility]---  back ?No date: Clotting disorder ([place]) ?    Comment:  Phreesia 10/29/2020 ?No date: Depression ?12/15/2011: Dermatitis ?No date: Difficult intubation ?05/14/2019: Educated about COVID-19 virus infection ?01/26/2016: Fall at home ?No date: Family history of adverse reaction to anesthesia ?    Comment:  MOTHER--- PONV ?09/17/2012: Fatigue ?No date: Fibromyalgia ?03/16/2008: FIBROMYALGIA ?    Comment:  Qualifier: Diagnosis of  By: Dierdre Harness   ?No date: GERD (gastroesophageal reflux disease) ?01/16/2013: Headache disorder ?01/26/2016: Headache syndrome ?07/08/2019: Hip pain, right ?No date: History of MRSA infection ?    Comment:  lip abscess ?06/2011: History of ovarian cyst ?    Comment:  s/p  BSO ?1997: History of pulmonary embolus (PE) ?    Comment:  post EP with ablation pulmonary veouns for SVT/ Atrial  ?             Fib. ?No date: History of supraventricular tachycardia ?    Comment:  s/p  ablation 1996  and 1997  by dr Caryl Comes ?1997:  History of TIA (transient ischemic attack) ?    Comment:  post op EP ablation PE ?No date: Hyperlipidemia ?03/16/2008: Hyperlipidemia LDL goal <100 ?    Comment:  Qualifier: Diagnosis of  By: Dierdre Harness   ?No date: Hypothyroidism ?    Comment:  followed by pcp ?No date: Incisional hernia ?12/15/2011: Insomnia ?08/22/2017: Intermittent palpitations ?No date: Interstitial cystitis ?    Comment:  09-13-2018   per pt last flare-up  May 2019 (followed by ?             pcp) ?02/17/2011: INTERSTITIAL CYSTITIS ?    Comment:  Qualifier: Diagnosis of  By: Moshe Cipro  MD, Joycelyn Schmid   ?No date: Iron deficiency anemia ?    Comment:  09-13-2018  PER PT STABLE ?07/25/2013: Irregular heart rate ?No date: Lipoma of back ?    Comment:  upper ?07/04/2019: Low back pain radiating to right leg ?06/14/2014: Low ferritin level ?10/27/2017: MDD (major depressive disorder), single episode, in full  ?remission ([place]) ?02/28/2015: Medically noncompliant ?    Comment:  Multiple missed appointments, both follow-up  ?             appointments and lab appointments.  ?04/29/5464: Metabolic syndrome X ?    Comment:  Qualifier: Diagnosis of  By: Moshe Cipro MD, Margaret  hBA1c ?             is 5.8 in 02/2013  ?No date: Migraines ?03/27/2013: Morbid obesity ([place]) ?01/03/2020: Muscle spasm ?07/17/2014: Nausea alone ?03/16/2008: NECK PAIN, CHRONIC ?    Comment:  Qualifier: Diagnosis of  By: Dierdre Harness   ?No date: Normal coronary arteries ?    Comment:  a. by CT 12/2018. ?03/16/2008: Obesity ?    Comment:  Qualifier: Diagnosis of  By: Dierdre Harness   ?08/23/2014: Oral ulceration ?    Comment:  Presented at 06/14/2014 visit  ?03/16/2008: Other malaise and fatigue ?    Comment:  Centricity Description: FATIGUE, CHRONIC Qualifier:  ?             Diagnosis of  By: Dierdre Harness   Centricity  ?             Description: FATIGUE Qualifier: Diagnosis of  By: Claybon Jabs ?             PA, Dawn   ?12/26/2009: OVARIAN CYST ?    Comment:  Qualifier: Diagnosis of  By: Moshe Cipro MD, Joycelyn Schmid   ?No date: Paget disease of breast, left ([place]) ?03/16/2008: Paget's disease of breast, left ([place]) ?    Comment:  Qualifier: Diagnosis of  By: Dierdre Harness  Left  ?             diagnosed in 2006 F/h breast cancer x 15 family members ?07/04/2012: Partial small bowel obstruction (H[facility]) ?No date: PONV (postoperative nausea and vomiting) ?    Comment:  SEVERE ?11/13/2011: Postsurgical menopause ?06/06/2019: Presence of IVC filter ?No date: PSVT (paroxysmal supraventricular tachycardia) ([place]) ?    Comment:  [place] ?10/13/2017: Recurrent oral  herpes simplex infection ?01/23/2013: ROM (right otitis media) ?05/08/2005: S/P insertion of IVC (inferior vena caval) filter ?    Comment:  [place] (non-retrievable)  /  dx 2019  a leg of  ?             filter is protruding thru the vena cava in to right L2  ?             vertebral body (09-13-2018  per pt having surgery to  ?             remove filter in [place]) ?1996: S/P radiofrequency ablation operation for arrhythmia ?    Comment:  1996  and 1997,   SVT and Atrial Fib ?01/26/2016: Sinusitis, chronic ?08/07/2008: SMALL BOWEL OBSTRUCTION, HX OF ?    Comment:  Annotation: obstruction w/ adhesions led to partial  ?             colectomy Qualifier: Diagnosis of  By: Craige Cotta   ?No date: Stroke [facility]) ?    Comment:  Phreesia 10/29/2020, TIAs in the past ?09/17/2012: Swelling of hand ?No date: Syncope ?02/03/2010: Tachycardia ?    Comment:  Qualifier: Diagnosis of  By: Via LPN, Jeani Hawking   ?No date: Thyroid disease ?    Comment:  Phreesia 10/29/2020 ?12/15/2011: Ulcer ?07/18/2014: Urinary frequency ?05/10/2013: Vaginitis and vulvovaginitis ?05/05/2015: Vitamin D deficiency ?No date: Wears glasses ? ?Past Surgical History: ?1987: ABDOMINAL HYSTERECTOMY ?03-07-2002   dr elsner  '@[facility]'$ : ANTERIOR CERVICAL DECOMP/DISCECTOMY  ?FUSION ?    Comment:  C 4 -- 5 ?1980: APPENDECTOMY ?2006: AUGMENTATION MAMMAPLASTY; Right ?07/24/2011: BILATERAL SALPINGOOPHORECTOMY ?    Comment:  via Explor. Lap. w/ intraoperative perf. bowel repair ?05/02/2021: BIOPSY ?    Comment:  Procedure: BIOPSY;  Surgeon: Harvel Quale,  ?             MD;  Location: [facility];  Service:  ?             Gastroenterology;;  small bowel, mid esophagus, distal  ?             esophagus, random colon biopsies ?12/04/2021: BIOPSY ?    Comment:  Procedure: BIOPSY;  Surgeon: [name], DO;   ?             Location: [facility];  Service: Endoscopy;; ?2019: BREAST BIOPSY; Right ?    Comment:  benign ?1993: BREAST ENHANCEMENT SURGERY; Bilateral ?No date:  BREAST IMPLANT REMOVAL; Bilateral ?No date: BREAST SURGERY; N/A ?    Comment:  Phreesia 10/29/2020 ?07-06-2003   dr Darnell Level brodie: CARDIAC CATHETERIZATION ?    Comment:  normal coronaries and LVF ?1996  an

## 2022-03-28 NOTE — ED Notes (Signed)
Attempted report x1. 

## 2022-03-28 NOTE — Assessment & Plan Note (Addendum)
History of PE and DVT on Xarelto.  CTA chest without PE.   ?-Hold Xarelto temporarily for planned GI procedure on 4/4.  ?-Xarelto restarted 03/31/22 pm ?

## 2022-03-28 NOTE — Assessment & Plan Note (Addendum)
Resume home Cymbalta and alprazolam ?-PDMP reviewed ?-pt receives alprazolam '1mg'$ , #60 monthly ?

## 2022-03-28 NOTE — ED Triage Notes (Signed)
Pt with hx of gastroparesis, +N/V.  States she passed out today. ?

## 2022-03-28 NOTE — Assessment & Plan Note (Addendum)
Potassium repleted.  ?-Low magnesium - IV replacement ordered and repleted.  ?

## 2022-03-28 NOTE — H&P (Signed)
?History and Physical  ? ? ?[name] JKD:[id number] DOB: Jan 29, 1961 DOA: 03/28/2022 ? ?PCP: Fayrene Helper, MD  ? ?Patient coming from: Home ? ?I have personally briefly reviewed patient's old medical records in [place] ? ?Chief Complaint: Vomiting, passing out ? ?HPI: [name] is a 61 y.o. female with medical history significant for complicated abdominal history at [facility], chronic diarrhea, hypertension, DVT and PE, fibromyalgia with chronic pain, atrial fibrillation status post ablation. ?Patient presented to the ED with complaints of nausea, and vomiting over the past 3 days with poor oral intake.  Baseline diarrhea which is unchanged.  Also reports abdominal bloating. ?She reports today she stood up from a sitting position and passed out.  Spouse at bedside today denies any jerking of patient extremities when she passed out, but patient reports she felt she had this. ?Patient has a history of gastroparesis and had EGD planned for 4/4, by Dr. Jenetta Downer. ? ?Patient reports blood pressure intermittently down to 70s and 80s at home. ? ?Patient's complicated abdominal history includes multiple abdominal surgeries  pertaining to a large rectus diastasis with abdominal hernia that began after an ovarian cyst removal that was converted to radical hysterectomy in 2012 at [facility]. This procedure was complicated by sepsis that required enterotomy and small bowel resection followed by chronic wound vac placement.  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 ?ED Course: Blood pressure soft, 1 reading of 74/64 ?  If accurate.  No vomiting episodes in the ED. ?- CT abdomen and pelvis with contrast shows numerous postsurgical changes to bowel including adhesive disease along the anterior abdominal wall, dilated small bowel loops without definite transition  point worrisome for small bowel obstruction, air-fluid level in colon in setting of diarrhea. ?-CTA chest no PE. ?- EDP talked with Dr. Arnoldo Morale, will see in the morning, okay for ice chips. ? ?Review of Systems: As per HPI all other systems reviewed and negative. ? ?Past Medical History:  ?Diagnosis Date  ? Abdominal hernia 08/14/2019  ? Abdominal pain 06/06/2019  ? Acute bronchitis 05/01/2013  ? Acute cystitis 06/09/2010  ? Qualifier: Diagnosis of  By: Cori Razor LPN, Brandi    ? Acute renal insufficiency 08/28/2011  ? Allergy   ? Phreesia 03/16/2021  ? Anemia   ? Phreesia 10/29/2020  ? Anxiety   ? Anxiety state 03/16/2008  ? Qualifier: Diagnosis of  By: Dierdre Harness    ? Arthritis   ? Phreesia 10/29/2020  ? Back pain with radiation 07/17/2014  ? Bilateral hand pain 07/27/2016  ? Blood transfusion without reported diagnosis   ? Phreesia 10/29/2020  ? Breast cancer ([place])   ? L breast- 2006  ? Cancer [facility])   ? Phreesia 10/29/2020  ? Carpal tunnel syndrome   ? Chronic constipation   ? Chronic pain syndrome   ? followed by [facility]---  back  ? Clotting disorder ([place])   ? Phreesia 10/29/2020  ? Depression   ? Dermatitis 12/15/2011  ? Difficult intubation   ? Educated about COVID-19 virus infection 05/14/2019  ? Fall at home 01/26/2016  ? Family history of adverse reaction to anesthesia   ? MOTHER--- PONV  ? Fatigue 09/17/2012  ? Fibromyalgia   ? FIBROMYALGIA 03/16/2008  ? Qualifier: Diagnosis of  By: Dierdre Harness    ? GERD (gastroesophageal reflux disease)   ?  Headache disorder 01/16/2013  ? Headache syndrome 01/26/2016  ? Hip pain, right 07/08/2019  ? History of MRSA infection   ? lip abscess  ? History of ovarian cyst 06/2011  ? s/p  BSO  ? History of pulmonary embolus (PE) 1997  ? post EP with ablation pulmonary veouns for SVT/ Atrial Fib.  ? History of supraventricular tachycardia   ? s/p  ablation 1996  and 1997  by dr Caryl Comes  ? History of TIA (transient ischemic attack) 1997  ? post op EP ablation PE  ? Hyperlipidemia   ?  Hyperlipidemia LDL goal <100 03/16/2008  ? Qualifier: Diagnosis of  By: Dierdre Harness    ? Hypothyroidism   ? followed by pcp  ? Incisional hernia   ? Insomnia 12/15/2011  ? Intermittent palpitations 08/22/2017  ? Interstitial cystitis   ? 09-13-2018   per pt last flare-up  May 2019 (followed by pcp)  ? INTERSTITIAL CYSTITIS 02/17/2011  ? Qualifier: Diagnosis of  By: Moshe Cipro MD, Joycelyn Schmid    ? Iron deficiency anemia   ? 09-13-2018  PER PT STABLE  ? Irregular heart rate 07/25/2013  ? Lipoma of back   ? upper  ? Low back pain radiating to right leg 07/04/2019  ? Low ferritin level 06/14/2014  ? MDD (major depressive disorder), single episode, in full remission ([place]) 10/27/2017  ? Medically noncompliant 02/28/2015  ? Multiple missed appointments, both follow-up appointments and lab appointments.   ? Metabolic syndrome X 05/15/8415  ? Qualifier: Diagnosis of  By: Moshe Cipro MD, Margaret  hBA1c is 5.8 in 02/2013   ? Migraines   ? Morbid obesity ([place]) 03/27/2013  ? Muscle spasm 01/03/2020  ? Nausea alone 07/17/2014  ? NECK PAIN, CHRONIC 03/16/2008  ? Qualifier: Diagnosis of  By: Dierdre Harness    ? Normal coronary arteries   ? a. by CT 12/2018.  ? Obesity 03/16/2008  ? Qualifier: Diagnosis of  By: Dierdre Harness    ? Oral ulceration 08/23/2014  ? Presented at 06/14/2014 visit   ? Other malaise and fatigue 03/16/2008  ? Centricity Description: FATIGUE, CHRONIC Qualifier: Diagnosis of  By: Dierdre Harness   Centricity Description: FATIGUE Qualifier: Diagnosis of  By: Christy Sartorius, [place]    ? OVARIAN CYST 12/26/2009  ? Qualifier: Diagnosis of  By: Moshe Cipro MD, Joycelyn Schmid    ? Paget disease of breast, left ([place])   ? Paget's disease of breast, left ([place]) 03/16/2008  ? Qualifier: Diagnosis of  By: Dierdre Harness  Left diagnosed in 2006 F/h breast cancer x 15 family members  ? Partial small bowel obstruction ([place]) 07/04/2012  ? PONV (postoperative nausea and vomiting)   ? SEVERE  ? Postsurgical menopause 11/13/2011  ? Presence of IVC filter 06/06/2019  ? PSVT  (paroxysmal supraventricular tachycardia) ([place])   ? [place]  ? Recurrent oral herpes simplex infection 10/13/2017  ? ROM (right otitis media) 01/23/2013  ? S/P insertion of IVC (inferior vena caval) filter 05/08/2005  ? [place] (non-retrievable)  /  dx 2019  a leg of filter is protruding thru the vena cava in to right L2 vertebral body (09-13-2018  per pt having surgery to remove filter in [place])  ? S/P radiofrequency ablation operation for arrhythmia 1996  ? 1996  and 1997,   SVT and Atrial Fib  ? Sinusitis, chronic 01/26/2016  ? SMALL BOWEL OBSTRUCTION, HX OF 08/07/2008  ? Annotation: obstruction w/ adhesions led to partial colectomy Qualifier: Diagnosis of  By: Laurance Flatten,  Estill Bamberg    ? Stroke [facility])   ? Phreesia 10/29/2020, TIAs in the past  ? Swelling of hand 09/17/2012  ? Syncope   ? Tachycardia 02/03/2010  ? Qualifier: Diagnosis of  By: Via LPN, Jeani Hawking    ? Thyroid disease   ? Phreesia 10/29/2020  ? Ulcer 12/15/2011  ? Urinary frequency 07/18/2014  ? Vaginitis and vulvovaginitis 05/10/2013  ? Vitamin D deficiency 05/05/2015  ? Wears glasses   ? ? ?Past Surgical History:  ?Procedure Laterality Date  ? ABDOMINAL HYSTERECTOMY  1987  ? ANTERIOR CERVICAL DECOMP/DISCECTOMY FUSION  03-07-2002   dr elsner  '@[facility]'$   ? C 4 -- 5  ? APPENDECTOMY  1980  ? AUGMENTATION MAMMAPLASTY Right 2006  ? BILATERAL SALPINGOOPHORECTOMY  07/24/2011  ? via Explor. Lap. w/ intraoperative perf. bowel repair  ? BIOPSY  05/02/2021  ? Procedure: BIOPSY;  Surgeon: Montez Morita, Quillian Quince, MD;  Location[facility];  Service: Gastroenterology;;  small bowel, mid esophagus, distal esophagus, random colon biopsies  ? BIOPSY  12/04/2021  ? Procedure: BIOPSY;  Surgeon: [name], DO;  Location: [facility];  Service: Endoscopy;;  ? BREAST BIOPSY Right 2019  ? benign  ? BREAST ENHANCEMENT SURGERY Bilateral 1993  ? BREAST IMPLANT REMOVAL Bilateral   ? BREAST SURGERY N/A   ? Phreesia 10/29/2020  ? CARDIAC CATHETERIZATION  07-06-2003   dr  Darnell Level brodie  ? normal coronaries and LVF  ? CARDIAC ELECTROPHYSIOLOGY STUDY AND ABLATION  1996  and 1997  ? CARDIOVASCULAR STRESS TEST  11-18-2015   dr Caryl Comes  ? normal nuclear study w/ no ischemia/  nor

## 2022-03-28 NOTE — Assessment & Plan Note (Addendum)
Blood pressure soft. ?-temporarily holding metoprolol with soft blood pressures. ?-will not restart metoprolol ?

## 2022-03-28 NOTE — ED Notes (Signed)
Pt used bedside commode.

## 2022-03-29 DIAGNOSIS — K2289 Other specified disease of esophagus: Secondary | ICD-10-CM | POA: Diagnosis not present

## 2022-03-29 DIAGNOSIS — K565 Intestinal adhesions [bands], unspecified as to partial versus complete obstruction: Secondary | ICD-10-CM | POA: Diagnosis present

## 2022-03-29 DIAGNOSIS — Z885 Allergy status to narcotic agent status: Secondary | ICD-10-CM | POA: Diagnosis not present

## 2022-03-29 DIAGNOSIS — K317 Polyp of stomach and duodenum: Secondary | ICD-10-CM | POA: Diagnosis present

## 2022-03-29 DIAGNOSIS — K529 Noninfective gastroenteritis and colitis, unspecified: Secondary | ICD-10-CM | POA: Diagnosis present

## 2022-03-29 DIAGNOSIS — R55 Syncope and collapse: Secondary | ICD-10-CM | POA: Diagnosis present

## 2022-03-29 DIAGNOSIS — I10 Essential (primary) hypertension: Secondary | ICD-10-CM

## 2022-03-29 DIAGNOSIS — K3184 Gastroparesis: Secondary | ICD-10-CM | POA: Diagnosis present

## 2022-03-29 DIAGNOSIS — K219 Gastro-esophageal reflux disease without esophagitis: Secondary | ICD-10-CM | POA: Diagnosis present

## 2022-03-29 DIAGNOSIS — Z7984 Long term (current) use of oral hypoglycemic drugs: Secondary | ICD-10-CM | POA: Diagnosis not present

## 2022-03-29 DIAGNOSIS — Z7901 Long term (current) use of anticoagulants: Secondary | ICD-10-CM | POA: Diagnosis not present

## 2022-03-29 DIAGNOSIS — E785 Hyperlipidemia, unspecified: Secondary | ICD-10-CM | POA: Diagnosis present

## 2022-03-29 DIAGNOSIS — E860 Dehydration: Secondary | ICD-10-CM | POA: Diagnosis present

## 2022-03-29 DIAGNOSIS — I4891 Unspecified atrial fibrillation: Secondary | ICD-10-CM | POA: Diagnosis present

## 2022-03-29 DIAGNOSIS — F1120 Opioid dependence, uncomplicated: Secondary | ICD-10-CM | POA: Diagnosis present

## 2022-03-29 DIAGNOSIS — Z7982 Long term (current) use of aspirin: Secondary | ICD-10-CM | POA: Diagnosis not present

## 2022-03-29 DIAGNOSIS — M797 Fibromyalgia: Secondary | ICD-10-CM | POA: Diagnosis present

## 2022-03-29 DIAGNOSIS — Z95828 Presence of other vascular implants and grafts: Secondary | ICD-10-CM | POA: Diagnosis not present

## 2022-03-29 DIAGNOSIS — Z86711 Personal history of pulmonary embolism: Secondary | ICD-10-CM | POA: Diagnosis not present

## 2022-03-29 DIAGNOSIS — Z803 Family history of malignant neoplasm of breast: Secondary | ICD-10-CM | POA: Diagnosis not present

## 2022-03-29 DIAGNOSIS — Z79899 Other long term (current) drug therapy: Secondary | ICD-10-CM | POA: Diagnosis not present

## 2022-03-29 DIAGNOSIS — E876 Hypokalemia: Secondary | ICD-10-CM | POA: Diagnosis present

## 2022-03-29 DIAGNOSIS — K56609 Unspecified intestinal obstruction, unspecified as to partial versus complete obstruction: Secondary | ICD-10-CM | POA: Diagnosis not present

## 2022-03-29 DIAGNOSIS — K5989 Other specified functional intestinal disorders: Secondary | ICD-10-CM | POA: Diagnosis not present

## 2022-03-29 DIAGNOSIS — F411 Generalized anxiety disorder: Secondary | ICD-10-CM | POA: Diagnosis present

## 2022-03-29 DIAGNOSIS — E669 Obesity, unspecified: Secondary | ICD-10-CM | POA: Diagnosis present

## 2022-03-29 LAB — CBC
HCT: 39.1 % (ref 36.0–46.0)
Hemoglobin: 13 g/dL (ref 12.0–15.0)
MCH: 28.8 pg (ref 26.0–34.0)
MCHC: 33.2 g/dL (ref 30.0–36.0)
MCV: 86.5 fL (ref 80.0–100.0)
Platelets: 202 10*3/uL (ref 150–400)
RBC: 4.52 MIL/uL (ref 3.87–5.11)
RDW: 13.6 % (ref 11.5–15.5)
WBC: 5.7 10*3/uL (ref 4.0–10.5)
nRBC: 0 % (ref 0.0–0.2)

## 2022-03-29 LAB — BASIC METABOLIC PANEL
Anion gap: 7 (ref 5–15)
BUN: 16 mg/dL (ref 6–20)
CO2: 23 mmol/L (ref 22–32)
Calcium: 8.8 mg/dL — ABNORMAL LOW (ref 8.9–10.3)
Chloride: 109 mmol/L (ref 98–111)
Creatinine, Ser: 0.68 mg/dL (ref 0.44–1.00)
GFR, Estimated: >60 mL/min (ref >60)
Glucose, Bld: 87 mg/dL (ref 70–99)
Potassium: 3.5 mmol/L (ref 3.5–5.1)
Sodium: 139 mmol/L (ref 135–145)

## 2022-03-29 LAB — TSH: TSH: 1.282 u[IU]/mL (ref 0.350–4.500)

## 2022-03-29 LAB — PHOSPHORUS: Phosphorus: 3.9 mg/dL (ref 2.5–4.6)

## 2022-03-29 MED ORDER — HYDROMORPHONE HCL 1 MG/ML IJ SOLN
0.5000 mg | INTRAMUSCULAR | Status: DC | PRN
  Administered 2022-03-29: 1 mg via INTRAVENOUS
  Administered 2022-03-29: 0.5 mg via INTRAVENOUS
  Administered 2022-03-29 – 2022-03-30 (×4): 1 mg via INTRAVENOUS
  Administered 2022-03-30: 0.5 mg via INTRAVENOUS
  Administered 2022-03-30 – 2022-04-02 (×16): 1 mg via INTRAVENOUS
  Filled 2022-03-29: qty 1
  Filled 2022-03-29: qty 0.5
  Filled 2022-03-29 (×7): qty 1
  Filled 2022-03-29: qty 0.5
  Filled 2022-03-29 (×13): qty 1

## 2022-03-29 MED ORDER — KETOROLAC TROMETHAMINE 15 MG/ML IJ SOLN
15.0000 mg | Freq: Once | INTRAMUSCULAR | Status: AC
  Administered 2022-03-29: 15 mg via INTRAVENOUS
  Filled 2022-03-29: qty 1

## 2022-03-29 MED ORDER — LEVOTHYROXINE SODIUM 25 MCG PO TABS
125.0000 ug | ORAL_TABLET | Freq: Every day | ORAL | Status: DC
  Administered 2022-03-30 – 2022-04-02 (×4): 125 ug via ORAL
  Filled 2022-03-29 (×4): qty 1

## 2022-03-29 MED ORDER — ASPIRIN 81 MG PO CHEW
81.0000 mg | CHEWABLE_TABLET | Freq: Every day | ORAL | Status: DC
  Administered 2022-03-31 – 2022-04-02 (×3): 81 mg via ORAL
  Filled 2022-03-29 (×4): qty 1

## 2022-03-29 MED ORDER — POTASSIUM CHLORIDE IN NACL 20-0.9 MEQ/L-% IV SOLN
INTRAVENOUS | Status: DC
Start: 2022-03-29 — End: 2022-04-02

## 2022-03-29 MED ORDER — HYDROMORPHONE HCL 1 MG/ML IJ SOLN: 0.5000 mg | Freq: Four times a day (QID) | INTRAMUSCULAR | Status: DC | PRN

## 2022-03-29 MED ORDER — MELATONIN 3 MG PO TABS
9.0000 mg | ORAL_TABLET | Freq: Every evening | ORAL | Status: DC | PRN
  Administered 2022-03-31 – 2022-04-01 (×2): 9 mg via ORAL
  Filled 2022-03-29 (×2): qty 3

## 2022-03-29 MED ORDER — MAGNESIUM SULFATE 4 GM/100ML IV SOLN
4.0000 g | Freq: Once | INTRAVENOUS | Status: AC
Start: 2022-03-29 — End: 2022-03-29
  Administered 2022-03-29: 4 g via INTRAVENOUS
  Filled 2022-03-29: qty 100

## 2022-03-29 MED ORDER — FLUTICASONE PROPIONATE 50 MCG/ACT NA SUSP: 2.0000 | Freq: Every day | NASAL | Status: DC | PRN

## 2022-03-29 MED ORDER — PRAVASTATIN SODIUM 40 MG PO TABS
40.0000 mg | ORAL_TABLET | Freq: Every day | ORAL | Status: DC
  Administered 2022-03-29 – 2022-04-01 (×4): 40 mg via ORAL
  Filled 2022-03-29 (×4): qty 1

## 2022-03-29 MED ORDER — HYDROMORPHONE HCL 1 MG/ML IJ SOLN: 0.5000 mg | INTRAMUSCULAR | Status: DC | PRN

## 2022-03-29 MED ORDER — CYCLOBENZAPRINE HCL 10 MG PO TABS
10.0000 mg | ORAL_TABLET | Freq: Two times a day (BID) | ORAL | Status: DC | PRN
  Administered 2022-03-29 – 2022-04-01 (×4): 10 mg via ORAL
  Filled 2022-03-29 (×4): qty 1

## 2022-03-29 MED ORDER — ERYTHROMYCIN 250 MG PO TBEC
250.0000 mg | DELAYED_RELEASE_TABLET | Freq: Three times a day (TID) | ORAL | Status: DC
  Administered 2022-03-29 – 2022-04-02 (×11): 250 mg via ORAL
  Filled 2022-03-29 (×27): qty 1

## 2022-03-29 MED ORDER — DULOXETINE HCL 60 MG PO CPEP
60.0000 mg | ORAL_CAPSULE | Freq: Every day | ORAL | Status: DC
  Administered 2022-03-29 – 2022-04-02 (×5): 60 mg via ORAL
  Filled 2022-03-29 (×5): qty 1

## 2022-03-29 NOTE — Assessment & Plan Note (Addendum)
--  working closely with GI, Dr. Jenetta Downer with GI  ?--overall improved>>soft stool ?-having BMs for 3 days prior to d/c ?

## 2022-03-29 NOTE — Consult Note (Addendum)
Reason for Consult: Small bowel obstruction versus bowel dysmotility ?Referring Physician: Dr. Wynetta Emery ? ?[name] is an 61 y.o. female.  ?HPI: Patient is a 61 year old white female with multiple medical problems including gastroparesis, multiple abdominal procedures, DVT and PE, fibromyalgia, atrial fibrillation, and multiple admissions for nausea and vomiting who presents with an episode of passing out because she was weak.  She also describes nausea.  She has been eating less due to abdominal discomfort.  She did have a small bowel movement and passed gas yesterday evening.  She states this occurs frequently.  She is scheduled to have an EGD with possible Botox injection by Dr. Jenetta Downer on 03/31/2022.  She was admitted yesterday evening.  This morning, she does feel better and is passing flatus.  She denies any nausea or vomiting.  No fever chills have been noted.  She denies any significant abdominal pain. ? ?Past Medical History:  ?Diagnosis Date  ? Abdominal hernia 08/14/2019  ? Abdominal pain 06/06/2019  ? Acute bronchitis 05/01/2013  ? Acute cystitis 06/09/2010  ? Qualifier: Diagnosis of  By: Cori Razor LPN, Brandi    ? Acute renal insufficiency 08/28/2011  ? Allergy   ? Phreesia 03/16/2021  ? Anemia   ? Phreesia 10/29/2020  ? Anxiety   ? Anxiety state 03/16/2008  ? Qualifier: Diagnosis of  By: Dierdre Harness    ? Arthritis   ? Phreesia 10/29/2020  ? Back pain with radiation 07/17/2014  ? Bilateral hand pain 07/27/2016  ? Blood transfusion without reported diagnosis   ? Phreesia 10/29/2020  ? Breast cancer ([place])   ? L breast- 2006  ? Cancer [facility])   ? Phreesia 10/29/2020  ? Carpal tunnel syndrome   ? Chronic constipation   ? Chronic pain syndrome   ? followed by [facility]---  back  ? Clotting disorder ([place])   ? Phreesia 10/29/2020  ? Depression   ? Dermatitis 12/15/2011  ? Difficult intubation   ? Educated about COVID-19 virus infection 05/14/2019  ? Fall at home 01/26/2016  ? Family history of adverse  reaction to anesthesia   ? MOTHER--- PONV  ? Fatigue 09/17/2012  ? Fibromyalgia   ? FIBROMYALGIA 03/16/2008  ? Qualifier: Diagnosis of  By: Dierdre Harness    ? GERD (gastroesophageal reflux disease)   ? Headache disorder 01/16/2013  ? Headache syndrome 01/26/2016  ? Hip pain, right 07/08/2019  ? History of MRSA infection   ? lip abscess  ? History of ovarian cyst 06/2011  ? s/p  BSO  ? History of pulmonary embolus (PE) 1997  ? post EP with ablation pulmonary veouns for SVT/ Atrial Fib.  ? History of supraventricular tachycardia   ? s/p  ablation 1996  and 1997  by dr Caryl Comes  ? History of TIA (transient ischemic attack) 1997  ? post op EP ablation PE  ? Hyperlipidemia   ? Hyperlipidemia LDL goal <100 03/16/2008  ? Qualifier: Diagnosis of  By: Dierdre Harness    ? Hypothyroidism   ? followed by pcp  ? Incisional hernia   ? Insomnia 12/15/2011  ? Intermittent palpitations 08/22/2017  ? Interstitial cystitis   ? 09-13-2018   per pt last flare-up  May 2019 (followed by pcp)  ? INTERSTITIAL CYSTITIS 02/17/2011  ? Qualifier: Diagnosis of  By: Moshe Cipro MD, Joycelyn Schmid    ? Iron deficiency anemia   ? 09-13-2018  PER PT STABLE  ? Irregular heart rate 07/25/2013  ? Lipoma of back   ? upper  ?  Low back pain radiating to right leg 07/04/2019  ? Low ferritin level 06/14/2014  ? MDD (major depressive disorder), single episode, in full remission ([place]) 10/27/2017  ? Medically noncompliant 02/28/2015  ? Multiple missed appointments, both follow-up appointments and lab appointments.   ? Metabolic syndrome X 8/46/6599  ? Qualifier: Diagnosis of  By: Moshe Cipro MD, Margaret  hBA1c is 5.8 in 02/2013   ? Migraines   ? Morbid obesity ([place]) 03/27/2013  ? Muscle spasm 01/03/2020  ? Nausea alone 07/17/2014  ? NECK PAIN, CHRONIC 03/16/2008  ? Qualifier: Diagnosis of  By: Dierdre Harness    ? Normal coronary arteries   ? a. by CT 12/2018.  ? Obesity 03/16/2008  ? Qualifier: Diagnosis of  By: Dierdre Harness    ? Oral ulceration 08/23/2014  ? Presented at 06/14/2014 visit   ?  Other malaise and fatigue 03/16/2008  ? Centricity Description: FATIGUE, CHRONIC Qualifier: Diagnosis of  By: Dierdre Harness   Centricity Description: FATIGUE Qualifier: Diagnosis of  By: Christy Sartorius, [place]    ? OVARIAN CYST 12/26/2009  ? Qualifier: Diagnosis of  By: Moshe Cipro MD, Joycelyn Schmid    ? Paget disease of breast, left ([place])   ? Paget's disease of breast, left ([place]) 03/16/2008  ? Qualifier: Diagnosis of  By: Dierdre Harness  Left diagnosed in 2006 F/h breast cancer x 15 family members  ? Partial small bowel obstruction ([place]) 07/04/2012  ? PONV (postoperative nausea and vomiting)   ? SEVERE  ? Postsurgical menopause 11/13/2011  ? Presence of IVC filter 06/06/2019  ? PSVT (paroxysmal supraventricular tachycardia) ([place])   ? [place]  ? Recurrent oral herpes simplex infection 10/13/2017  ? ROM (right otitis media) 01/23/2013  ? S/P insertion of IVC (inferior vena caval) filter 05/08/2005  ? [place] (non-retrievable)  /  dx 2019  a leg of filter is protruding thru the vena cava in to right L2 vertebral body (09-13-2018  per pt having surgery to remove filter in [place])  ? S/P radiofrequency ablation operation for arrhythmia 1996  ? 1996  and 1997,   SVT and Atrial Fib  ? Sinusitis, chronic 01/26/2016  ? SMALL BOWEL OBSTRUCTION, HX OF 08/07/2008  ? Annotation: obstruction w/ adhesions led to partial colectomy Qualifier: Diagnosis of  By: Craige Cotta    ? Stroke [facility])   ? Phreesia 10/29/2020, TIAs in the past  ? Swelling of hand 09/17/2012  ? Syncope   ? Tachycardia 02/03/2010  ? Qualifier: Diagnosis of  By: Via LPN, Jeani Hawking    ? Thyroid disease   ? Phreesia 10/29/2020  ? Ulcer 12/15/2011  ? Urinary frequency 07/18/2014  ? Vaginitis and vulvovaginitis 05/10/2013  ? Vitamin D deficiency 05/05/2015  ? Wears glasses   ? ? ?Past Surgical History:  ?Procedure Laterality Date  ? ABDOMINAL HYSTERECTOMY  1987  ? ANTERIOR CERVICAL DECOMP/DISCECTOMY FUSION  03-07-2002   dr elsner  '@[facility]'$   ? C 4 -- 5  ? APPENDECTOMY  1980  ?  AUGMENTATION MAMMAPLASTY Right 2006  ? BILATERAL SALPINGOOPHORECTOMY  07/24/2011  ? via Explor. Lap. w/ intraoperative perf. bowel repair  ? BIOPSY  05/02/2021  ? Procedure: BIOPSY;  Surgeon: Montez Morita, Quillian Quince, MD;  [facility];  Service: Gastroenterology;;  small bowel, mid esophagus, distal esophagus, random colon biopsies  ? BIOPSY  12/04/2021  ? Procedure: BIOPSY;  Surgeon: [name], DO;  [facility]: [facility];  Service: Endoscopy;;  ? BREAST BIOPSY Right 2019  ? benign  ?  BREAST ENHANCEMENT SURGERY Bilateral 1993  ? BREAST IMPLANT REMOVAL Bilateral   ? BREAST SURGERY N/A   ? Phreesia 10/29/2020  ? CARDIAC CATHETERIZATION  07-06-2003   dr Darnell Level brodie  ? normal coronaries and LVF  ? CARDIAC ELECTROPHYSIOLOGY STUDY AND ABLATION  1996  and 1997  ? CARDIOVASCULAR STRESS TEST  11-18-2015   dr Caryl Comes  ? normal nuclear study w/ no ischemia/  normal LV function and wall motion , ef 84%  ? CARPAL TUNNEL RELEASE Right ?  ? [place]  ? CESAREAN SECTION N/A   ? Phreesia 10/29/2020  ? CHOLECYSTECTOMY N/A   ? Phreesia 10/29/2020  ? COLON SURGERY    ? COLONOSCOPY WITH PROPOFOL N/A 05/02/2021  ? Procedure: COLONOSCOPY WITH PROPOFOL;  Surgeon: Harvel Quale, MD;  [facility];  Service: Gastroenterology;  Laterality: N/A;  12:30 PM  ? CYSTO/  HYDRODISTENTION/  INSTILATION THERAPY  MULTIPLE  ? ENTEROCUTANEOUS FISTULA CLOSURE  multiple  ? last one 2015 with small bowel resection  ? ESOPHAGOGASTRODUODENOSCOPY (EGD) WITH PROPOFOL N/A 05/02/2021  ? Procedure: ESOPHAGOGASTRODUODENOSCOPY (EGD) WITH PROPOFOL;  Surgeon: Harvel Quale, MD;  [facility];  Service: Gastroenterology;  Laterality: N/A;  ? ESOPHAGOGASTRODUODENOSCOPY (EGD) WITH PROPOFOL N/A 12/04/2021  ? Procedure: ESOPHAGOGASTRODUODENOSCOPY (EGD) WITH PROPOFOL;  Surgeon: [name], DO;  [facility];  Service: Endoscopy;  Laterality: N/A;  ? EXPLORATORY LAPAROTOMY INCISIONAL  VENTRAL HERNIA REPAIR / RESECTION SMALL BOWEL  11-09-2014   '@[facility]'$   ? FRACTURE SURGERY N/A   ? Phreesia 10/29/2020  ? JOINT REPLACEMENT N/A   ? Phreesia 10/29/2020  ? LIPOMA EXCISION Right 09/16/2018  ? Procedure:

## 2022-03-29 NOTE — Hospital Course (Addendum)
61 y.o. female with medical history significant for complicated abdominal history at [facility], chronic diarrhea, hypertension, DVT and PE, fibromyalgia with chronic pain, atrial fibrillation status post ablation. ?Patient presented to the ED with complaints of nausea, and vomiting over the past 3 days with poor oral intake.  Baseline diarrhea which is unchanged.  Also reports abdominal bloating. ?She reports today she stood up from a sitting position and passed out.  Spouse at bedside today denies any jerking of patient extremities when she passed out, but patient reports she felt she had this. ?Patient has a history of gastroparesis and had EGD planned for 4/4, by Dr. Jenetta Downer. ?  ?Patient reports blood pressure intermittently down to 70s and 80s at home. ?  ?Patient's complicated abdominal history includes multiple abdominal surgeries  pertaining to a large rectus diastasis with abdominal hernia that began after an ovarian cyst removal that was converted to radical hysterectomy in 2012 at [facility]. This procedure was complicated by sepsis that required enterotomy and small bowel resection followed by chronic wound vac placement.  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  ?ED Course: Blood pressure soft, 1 reading of 74/64 ?  If accurate.  No vomiting episodes in the ED. ?- CT abdomen and pelvis with contrast shows numerous postsurgical changes to bowel including adhesive disease along the anterior abdominal wall, dilated small bowel loops without definite transition point worrisome for small bowel obstruction, air-fluid level in colon in setting of diarrhea. ?-CTA chest no PE. ?- EDP talked with Dr. Arnoldo Morale, will see in the morning, okay for ice chips. ? ?03/29/2022:  Surgery evaluation completed.  Clear liquid diet started.  ? ?03/30/2022:  having small BMs, advancing to full  liquid diet, holding blood thinners for GI procedure planned for 4/4.  Amitiza restarted.  ? ?03/31/2022: EGD with pylorus botox injection with Dr. Jenetta Downer. Full liquid diet today.   ? ?04/01/22:  pt complains of myalgias in thighs.  Did not want to eat today despite advanced to soft diet.  Had one BM ? ?04/02/22:  myalgia in thighs a little better.  Able to ambulate.  Abd pain overall slowly improving.  Tolerating soft diet without n/v.  Had one BM in last 24 hours.  Stable for d/c from GI standpoint. ?

## 2022-03-29 NOTE — Progress Notes (Signed)
?PROGRESS NOTE ? ? ?[name]  WSF:[id number] DOB: Apr 05, 1961 DOA: 03/28/2022 ?PCP: Fayrene Helper, MD  ? ?Chief Complaint  ?Patient presents with  ? Near Syncope  ? ?Level of care: Med-Surg ? ?Brief Admission History:  ?61 y.o. female with medical history significant for complicated abdominal history at [facility], chronic diarrhea, hypertension, DVT and PE, fibromyalgia with chronic pain, atrial fibrillation status post ablation. ?Patient presented to the ED with complaints of nausea, and vomiting over the past 3 days with poor oral intake.  Baseline diarrhea which is unchanged.  Also reports abdominal bloating. ?She reports today she stood up from a sitting position and passed out.  Spouse at bedside today denies any jerking of patient extremities when she passed out, but patient reports she felt she had this. ?Patient has a history of gastroparesis and had EGD planned for 4/4, by Dr. Jenetta Downer. ?  ?Patient reports blood pressure intermittently down to 70s and 80s at home. ?  ?Patient's complicated abdominal history includes multiple abdominal surgeries  pertaining to a large rectus diastasis with abdominal hernia that began after an ovarian cyst removal that was converted to radical hysterectomy in 2012 at [facility]. This procedure was complicated by sepsis that required enterotomy and small bowel resection followed by chronic wound vac placement.  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  ?ED Course: Blood pressure soft, 1 reading of 74/64 ?  If accurate.  No vomiting episodes in the ED. ?- CT abdomen and pelvis with contrast shows numerous postsurgical changes to bowel including adhesive disease along the anterior abdominal wall, dilated small bowel loops without definite transition point worrisome for small bowel obstruction, air-fluid level in colon in setting  of diarrhea. ?-CTA chest no PE. ?- EDP talked with Dr. Arnoldo Morale, will see in the morning, okay for ice chips. ? ?03/29/2022:  Surgery evaluation completed.  Clear liquid diet started.  ?  ?Assessment and Plan: ?* SBO (small bowel obstruction) ([place]) ?Complicated bowel history including multiple abdominal surgeries-see HPI above.  Presenting with vomiting, right sided and lower abdominal cramps, chronic diarrhea unchanged.  ?- CT abdomen and pelvis with contrast shows numerous postsurgical changes to bowel including adhesive disease along the anterior abdominal wall, dilated small bowel loops without definite transition point worrisome for small bowel obstruction, air-fluid level in colon in setting of diarrhea. ?--Dr. Arnoldo Morale consulted and recommended starting clears.   ?--continue supportive measures.  ? ?Hypokalemia ?Potassium repleted.  ?-Low magnesium - IV replacement ordered, recheck in AM ? ?Chronic diarrhea ?--working closely with GI, Dr. Jenetta Downer notified of admission ? ?History of pulmonary embolus (PE) ?History of PE and DVT on Xarelto.  CTA chest without PE.   ?-Hold Xarelto for now pending general surgery evaluation, continue DVT prophylaxis with enoxaparin ? ?History of DVT (deep vein thrombosis) ?--rivaroxaban temporarily on hold  ? ?Essential hypertension ?Blood pressure soft. ?-Hold metoprolol with soft blood pressure in the setting of dehydration. ? ?Syncope ?In the setting of vomiting, chronic diarrhea, SBO, and dehydration.  Patient reports blood pressure down to 70s and 80s at home. Blood pressure soft In ED.  Similar hospitalization 11/2021 with vomiting, dehydration and syncope. ?-  CTA negative for PE.  EKG, troponins unremarkable. ?-Last echo 10/2021-EF 60 to 65%, normal LV diastolic parameters, no significant valvular abnormalities. ?-PT evaluation requested.  ? ?Anxiety state ?Resume home Cymbalta ? ?DVT  prophylaxis: enoxaparin ?Code Status: full  ?Family Communication:  ?Disposition: Status  is: Inpatient ?Remains inpatient appropriate because: IV fluid required for treatment ?  ?Consultants:  ?surgery ?Procedures:  ?N/a ?Antimicrobials:  ?N/a  ?Subjective: ?Pt reports feeling less weak but not back to baseline ?Objective: ?Vitals:  ? 03/29/22 0030 03/29/22 0113 03/29/22 0515 03/29/22 0933  ?BP: 98/73 (!) 132/91 101/89 (!) 89/61  ?Pulse: 84 88 80 81  ?Resp: (!) '21 19 18 20  '$ ?Temp:  98.1 ?F (36.7 ?C) 98.1 ?F (36.7 ?C) 98.5 ?F (36.9 ?C)  ?TempSrc:  Oral Oral Oral  ?SpO2: 97% 98% 98% 93%  ?Weight:      ?Height:      ? ? ?Intake/Output Summary (Last 24 hours) at 03/29/2022 1311 ?Last data filed at 03/29/2022 0350 ?Gross per 24 hour  ?Intake 1473.08 ml  ?Output --  ?Net 1473.08 ml  ? ?Filed Weights  ? 03/28/22 1611  ?Weight: 80.3 kg  ? ?Examination: ? ?General exam: Appears calm and comfortable  ?Respiratory system: Clear to auscultation. Respiratory effort normal. ?Cardiovascular system: normal S1 & S2 heard. No JVD, murmurs, rubs, gallops or clicks. No pedal edema. ?Gastrointestinal system: Abdomen is nondistended, soft and nontender. No organomegaly or masses felt. Normal bowel sounds heard. ?Central nervous system: Alert and oriented. No focal neurological deficits. ?Extremities: Symmetric 5 x 5 power. ?Skin: No rashes, lesions or ulcers. ?Psychiatry: Judgement and insight appear normal. Mood & affect appropriate.  ? ?Data Reviewed: I have personally reviewed following labs and imaging studies ? ?CBC: ?Recent Labs  ?Lab 03/26/22 ?0865 03/28/22 ?1642 03/29/22 ?7846  ?WBC 8.5 8.1 5.7  ?NEUTROABS 5.5 5.1  --   ?HGB 16.4* 16.5* 13.0  ?HCT 50.1* 48.7* 39.1  ?MCV 88.7 85.9 86.5  ?PLT 268 251 202  ? ? ?Basic Metabolic Panel: ?Recent Labs  ?Lab 03/26/22 ?9629 03/28/22 ?1642 03/28/22 ?1844 03/29/22 ?5284 03/29/22 ?0552  ?NA 138 136  --  139  --   ?K 3.5 3.3*  --  3.5  --   ?CL 104 107  --  109  --   ?CO2 23 19*  --  23  --   ?GLUCOSE 111* 124*  --  87  --   ?BUN 18 21*  --  16  --   ?CREATININE 0.72 0.84  --  0.68   --   ?CALCIUM 9.9 9.5  --  8.8*  --   ?MG 1.9  --  1.7  --   --   ?PHOS 3.6  --   --   --  3.9  ? ? ?CBG: ?Recent Labs  ?Lab 03/28/22 ?1742  ?GLUCAP 96  ? ? ?No results found for this or any previous visit (from the past 240 hour(s)).  ? ?Radiology Studies: ?CT Angio Chest PE W and/or Wo Contrast ? ?Result Date: 03/28/2022 ?CLINICAL DATA:  Syncope today.  Evaluate for PE. EXAM: CT ANGIOGRAPHY CHEST WITH CONTRAST TECHNIQUE: Multidetector CT imaging of the chest was performed using the standard protocol during bolus administration of intravenous contrast. Multiplanar CT image reconstructions and MIPs were obtained to evaluate the vascular anatomy. RADIATION DOSE REDUCTION: This exam was performed according to the departmental dose-optimization program which includes automated exposure control, adjustment of the mA and/or kV according to patient size and/or use of iterative reconstruction technique. CONTRAST:  158m OMNIPAQUE IOHEXOL 350 MG/ML SOLN COMPARISON:  None. FINDINGS: Cardiovascular: Satisfactory opacification of the pulmonary arteries to the segmental level. No evidence of pulmonary embolism. Normal heart size. No pericardial effusion.  Mediastinum/Nodes: No enlarged mediastinal, hilar, or axillary lymph nodes. Thyroid gland, trachea, and esophagus demonstrate no significant findings. Lungs/Pleura: Stable scarring in the lingula. Additional thin linear scarring versus subsegmental atelectasis in the right lower and right middle lobes. No focal consolidation, pleural effusion, or pneumothorax. No pleural effusion or pneumothorax. Upper Abdomen: Mild to moderately distended fluid-filled stomach. Several fluid-filled dilated small bowel loops are seen in the right upper abdomen. Please see today's dedicated CT abdomen pelvis report for further details. Cholecystectomy. Musculoskeletal: Partially visualized anterior cervical spinal fusion. No acute or suspicious osseous findings. Postoperative changes of left  mastectomy and reconstruction. Right breast implant. Review of the MIP images confirms the above findings. IMPRESSION: No pulmonary embolism. Stable scarring in the lingula and lower lungs. Lungs are otherwise

## 2022-03-29 NOTE — Assessment & Plan Note (Addendum)
--  rivaroxaban temporarily on hold initially due to anticipation of procedures ?-rivaroxaban restarted without complications ?

## 2022-03-30 DIAGNOSIS — K56609 Unspecified intestinal obstruction, unspecified as to partial versus complete obstruction: Secondary | ICD-10-CM | POA: Diagnosis not present

## 2022-03-30 DIAGNOSIS — E860 Dehydration: Secondary | ICD-10-CM | POA: Diagnosis not present

## 2022-03-30 DIAGNOSIS — I10 Essential (primary) hypertension: Secondary | ICD-10-CM | POA: Diagnosis not present

## 2022-03-30 DIAGNOSIS — K529 Noninfective gastroenteritis and colitis, unspecified: Secondary | ICD-10-CM | POA: Diagnosis not present

## 2022-03-30 DIAGNOSIS — K5989 Other specified functional intestinal disorders: Secondary | ICD-10-CM | POA: Diagnosis present

## 2022-03-30 LAB — MAGNESIUM: Magnesium: 1.9 mg/dL (ref 1.7–2.4)

## 2022-03-30 LAB — BASIC METABOLIC PANEL
Anion gap: 6 (ref 5–15)
BUN: 12 mg/dL (ref 6–20)
CO2: 22 mmol/L (ref 22–32)
Calcium: 8.2 mg/dL — ABNORMAL LOW (ref 8.9–10.3)
Chloride: 112 mmol/L — ABNORMAL HIGH (ref 98–111)
Creatinine, Ser: 0.5 mg/dL (ref 0.44–1.00)
GFR, Estimated: >60 mL/min (ref >60)
Glucose, Bld: 83 mg/dL (ref 70–99)
Potassium: 3.7 mmol/L (ref 3.5–5.1)
Sodium: 140 mmol/L (ref 135–145)

## 2022-03-30 MED ORDER — LUBIPROSTONE 24 MCG PO CAPS
24.0000 ug | ORAL_CAPSULE | Freq: Two times a day (BID) | ORAL | Status: DC
  Administered 2022-03-30 – 2022-04-02 (×6): 24 ug via ORAL
  Filled 2022-03-30 (×7): qty 1

## 2022-03-30 MED ORDER — MAGNESIUM SULFATE 2 GM/50ML IV SOLN
2.0000 g | Freq: Once | INTRAVENOUS | Status: AC
  Administered 2022-03-30: 2 g via INTRAVENOUS
  Filled 2022-03-30: qty 50

## 2022-03-30 MED ORDER — SODIUM CHLORIDE 0.9 % IV BOLUS
500.0000 mL | Freq: Once | INTRAVENOUS | Status: AC
  Administered 2022-03-30: 500 mL via INTRAVENOUS

## 2022-03-30 MED ORDER — ENOXAPARIN SODIUM 40 MG/0.4ML IJ SOSY: 40.0000 mg | PREFILLED_SYRINGE | INTRAMUSCULAR | Status: DC

## 2022-03-30 NOTE — Evaluation (Signed)
Physical Therapy Evaluation ?Patient Details ?Name: [name] ?MRN: [id number] ?DOB: 1961-06-27 ?Today's Date: 03/30/2022 ? ?History of Present Illness ? [name] is a 61 y.o. female with medical history significant for [facility], chronic diarrhea, hypertension, DVT and PE, fibromyalgia with chronic pain, atrial fibrillation status post ablation.  Patient presented to the ED with complaints of nausea, and vomiting over the past 3 days with poor oral intake.  Baseline diarrhea which is unchanged.  Also reports abdominal bloating.  She reports today she stood up from a sitting position and passed out.  Spouse at bedside today denies any jerking of patient extremities when she passed out, but patient reports she felt she had this.  Patient has a history of gastroparesis and had EGD planned for 4/4, by Dr. Jenetta Downer.     Patient reports blood pressure intermittently down to 70s and 80s at home.     Patient's complicated abdominal history includes multiple abdominal surgeries  pertaining to a large rectus diastasis with abdominal hernia that began after an ovarian cyst removal that was converted to radical hysterectomy in 2012 at [facility]. This procedure was complicated by sepsis that required enterotomy and small bowel resection followed by chronic wound vac placement.  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  ?Clinical Impression ? Patient functioning near baseline for functional mobility and gait demonstrating good return for bed mobility, transfers and ambulating in room/hallway without loss of balance.  Plan:  Patient discharged from physical therapy to care of nursing for ambulation daily as tolerated for length of stay.  ?   ?   ? ?Recommendations for follow up therapy are one component of a multi-disciplinary discharge  planning process, led by the attending physician.  Recommendations may be updated based on patient status, additional functional criteria and insurance authorization. ? ?Follow Up Recommendations No PT follow up ? ?  ?Assistance Recommended at Discharge PRN  ?Patient can return home with the following ? Other (comment);Help with stairs or ramp for entrance (near baseline) ? ?  ?Equipment Recommendations None recommended by PT  ?Recommendations for Other Services ?    ?  ?Functional Status Assessment Patient has had a recent decline in their functional status and demonstrates the ability to make significant improvements in function in a reasonable and predictable amount of time.  ? ?  ?Precautions / Restrictions Precautions ?Precautions: None ?Restrictions ?Weight Bearing Restrictions: No  ? ?  ? ?Mobility ? Bed Mobility ?Overal bed mobility: Independent ?  ?  ?  ?  ?  ?  ?  ?  ? ?Transfers ?Overall transfer level: Independent ?  ?  ?  ?  ?  ?  ?  ?  ?  ?  ? ?Ambulation/Gait ?Ambulation/Gait assistance: Modified independent (Device/Increase time) ?Gait Distance (Feet): 100 Feet ?Assistive device: IV Pole, None ?Gait Pattern/deviations: WFL(Within Functional Limits) ?Gait velocity: slightly decreased ?  ?  ?General Gait Details: grossly WFL demonstrating good return for ambulation in room and hallway without loss of balance pushing IV pole and ambulating without AD/IV pole ? ?Stairs ?  ?  ?  ?  ?  ? ?Wheelchair Mobility ?  ? ?Modified Rankin (Stroke Patients Only) ?  ? ?  ? ?Balance Overall balance assessment: No apparent balance deficits (not formally assessed) ?  ?  ?  ?  ?  ?  ?  ?  ?  ?  ?  ?  ?  ?  ?  ?  ?  ?  ?   ? ? ? ?  Pertinent Vitals/Pain Pain Assessment ?Pain Assessment: 0-10 ?Pain Score: 6  ?Pain Location: stomach ?Pain Descriptors / Indicators: Sore ?Pain Intervention(s): Limited activity within patient's tolerance, Monitored during session, Premedicated before session  ? ? ?Home Living Family/patient  expects to be discharged to:: Private residence ?Living Arrangements: Spouse/significant other ?Available Help at Discharge: Family;Available 24 hours/day ?Type of Home: House ?Home Access: Level entry ?  ?  ?  ?Home Layout: One level ?Home Equipment: Rolling Walker (2 wheels);BSC/3in1;Shower seat ?   ?  ?Prior Function Prior Level of Function : Independent/Modified Independent ?  ?  ?  ?  ?  ?  ?Mobility Comments: Community ambulator without AD, has not been driving lately due to dizziness ?ADLs Comments: Independent ?  ? ? ?Hand Dominance  ?   ? ?  ?Extremity/Trunk Assessment  ? Upper Extremity Assessment ?Upper Extremity Assessment: Overall WFL for tasks assessed ?  ? ?Lower Extremity Assessment ?Lower Extremity Assessment: Overall WFL for tasks assessed ?  ? ?Cervical / Trunk Assessment ?Cervical / Trunk Assessment: Normal  ?Communication  ? Communication: No difficulties  ?Cognition Arousal/Alertness: Awake/alert ?Behavior During Therapy: [facility] for tasks assessed/performed ?Overall Cognitive Status: Within Functional Limits for tasks assessed ?  ?  ?  ?  ?  ?  ?  ?  ?  ?  ?  ?  ?  ?  ?  ?  ?  ?  ?  ? ?  ?General Comments   ? ?  ?Exercises    ? ?Assessment/Plan  ?  ?PT Assessment Patient does not need any further PT services  ?PT Problem List   ? ?   ?  ?PT Treatment Interventions     ? ?PT Goals (Current goals can be found in the Care Plan section)  ?Acute Rehab PT Goals ?Patient Stated Goal: return home with family to assist ?PT Goal Formulation: With patient ?Time For Goal Achievement: 03/30/22 ?Potential to Achieve Goals: Good ? ?  ?Frequency   ?  ? ? ?Co-evaluation   ?  ?  ?  ?  ? ? ?  ?AM-PAC PT "6 Clicks" Mobility  ?Outcome Measure Help needed turning from your back to your side while in a flat bed without using bedrails?: None ?Help needed moving from lying on your back to sitting on the side of a flat bed without using bedrails?: None ?Help needed moving to and from a bed to a chair (including a  wheelchair)?: None ?Help needed standing up from a chair using your arms (e.g., wheelchair or bedside chair)?: None ?Help needed to walk in hospital room?: None ?Help needed climbing 3-5 steps with a railing? : None ?6 Click Score: 24 ? ?  ?End of Session   ?Activity Tolerance: Patient tolerated treatment well ?Patient left: in bed;with call bell/phone within reach ?Nurse Communication: Mobility status ?PT Visit Diagnosis: Unsteadiness on feet (R26.81);Other abnormalities of gait and mobility (R26.89);Muscle weakness (generalized) (M62.81) ?  ? ?Time: 0814-4818 ?PT Time Calculation (min) (ACUTE ONLY): 20 min ? ? ?Charges:   PT Evaluation ?$PT Eval Moderate Complexity: 1 Mod ?PT Treatments ?$Therapeutic Activity: 8-22 mins ?  ?   ? ? ?3:58 PM, 03/30/22 ?Lonell Grandchild, MPT ?Physical Therapist with [place] ?[facility] ?[phone] office ?[phone] mobile phone ? ? ?

## 2022-03-30 NOTE — Progress Notes (Addendum)
? ?Gastroenterology Progress Note  ? ?Referring Provider: Dr. Arnoldo Morale ?Primary Care Physician:  Fayrene Helper, MD ?Primary Gastroenterologist:  Dr. Jenetta Downer ? ?Patient ID: [name]; [id number]; 04/02/1961  ? ? ?Subjective  ? ?No nausea/vomiting. No flatus. Abdominal pain improving. Tolerating clears. Surgery advanced diet to fulls. Patient is scheduled for EGD with Botox as outpatient tomorrow. She would like to have this completed while inpatient.  ? ? ?Objective  ? ?Vital signs in last 24 hours ?Temp:  [98.1 ?F (36.7 ?C)-98.8 ?F (37.1 ?C)] 98.2 ?F (36.8 ?C) (04/03 0519) ?Pulse Rate:  [76-77] 76 (04/03 0519) ?Resp:  [15-20] 17 (04/03 0519) ?BP: (71-90)/(48-59) 85/57 (04/03 0519) ?SpO2:  [96 %-99 %] 99 % (04/03 0519) ?Last BM Date : 03/29/22 ? ?Physical Exam ?General:   Alert and oriented, pleasant ?Abdomen:  Bowel sounds present, soft, protruding incisional hernia. TTP at hernia site and suprapubic.  ?Neurologic:  Alert and  oriented x4 ?Psych:  Alert and cooperative. Normal mood and affect. ? ?Intake/Output from previous day: ?04/02 0701 - 04/03 0700 ?In: 2.2 [I.V.:2.2] ?Out: -  ?Intake/Output this shift: ?No intake/output data recorded. ? ?Lab Results ? ?Recent Labs  ?  03/28/22 ?1642 03/29/22 ?0452  ?WBC 8.1 5.7  ?HGB 16.5* 13.0  ?HCT 48.7* 39.1  ?PLT 251 202  ? ?BMET ?Recent Labs  ?  03/28/22 ?1642 03/29/22 ?0452 03/30/22 ?0448  ?NA 136 139 140  ?K 3.3* 3.5 3.7  ?CL 107 109 112*  ?CO2 19* 23 22  ?GLUCOSE 124* 87 83  ?BUN 21* 16 12  ?CREATININE 0.84 0.68 0.50  ?CALCIUM 9.5 8.8* 8.2*  ? ?LFT ?Recent Labs  ?  03/28/22 ?1642  ?PROT 7.1  ?ALBUMIN 4.0  ?AST 61*  ?ALT 71*  ?ALKPHOS 137*  ?BILITOT 0.7  ? ? ?Studies/Results ?CT Angio Chest PE W and/or Wo Contrast ? ?Result Date: 03/28/2022 ?CLINICAL DATA:  Syncope today.  Evaluate for PE. EXAM: CT ANGIOGRAPHY CHEST WITH CONTRAST TECHNIQUE: Multidetector CT imaging of the chest was performed using the standard protocol during bolus administration of  intravenous contrast. Multiplanar CT image reconstructions and MIPs were obtained to evaluate the vascular anatomy. RADIATION DOSE REDUCTION: This exam was performed according to the departmental dose-optimization program which includes automated exposure control, adjustment of the mA and/or kV according to patient size and/or use of iterative reconstruction technique. CONTRAST:  171m OMNIPAQUE IOHEXOL 350 MG/ML SOLN COMPARISON:  None. FINDINGS: Cardiovascular: Satisfactory opacification of the pulmonary arteries to the segmental level. No evidence of pulmonary embolism. Normal heart size. No pericardial effusion. Mediastinum/Nodes: No enlarged mediastinal, hilar, or axillary lymph nodes. Thyroid gland, trachea, and esophagus demonstrate no significant findings. Lungs/Pleura: Stable scarring in the lingula. Additional thin linear scarring versus subsegmental atelectasis in the right lower and right middle lobes. No focal consolidation, pleural effusion, or pneumothorax. No pleural effusion or pneumothorax. Upper Abdomen: Mild to moderately distended fluid-filled stomach. Several fluid-filled dilated small bowel loops are seen in the right upper abdomen. Please see today's dedicated CT abdomen pelvis report for further details. Cholecystectomy. Musculoskeletal: Partially visualized anterior cervical spinal fusion. No acute or suspicious osseous findings. Postoperative changes of left mastectomy and reconstruction. Right breast implant. Review of the MIP images confirms the above findings. IMPRESSION: No pulmonary embolism. Stable scarring in the lingula and lower lungs. Lungs are otherwise clear. Several dilated small bowel loops are seen in the right upper abdomen. Please see today's dedicated CT abdomen pelvis report for further details. Electronically Signed   By: LIleana Roup  M.D.   On: 03/28/2022 18:21  ? ?NM Gastric Emptying ? ?Result Date: 03/04/2022 ?CLINICAL DATA:  Nausea and vomiting concern for  gastroparesis. EXAM: NUCLEAR MEDICINE GASTRIC EMPTYING SCAN TECHNIQUE: After oral ingestion of radiolabeled meal, sequential abdominal images were obtained for 4 hours. Percentage of activity emptying the stomach was calculated at 1 hour, 2 hour, 3 hour, and 4 hours. RADIOPHARMACEUTICALS:  2.2 mCi Tc-59msulfur colloid in standardized meal COMPARISON:  November 11, 2021 CT FINDINGS: Expected location of the stomach in the left upper quadrant. Ingested meal empties the stomach gradually over the course of the study. 5% emptied at 1 hr ( normal >= 10%) 12% emptied at 2 hr ( normal >= 40%) 20% emptied at 3 hr ( normal >= 70%) 28% emptied at 4 hr ( normal >= 90%) IMPRESSION: Scintigraphic findings consistent with delayed gastric emptying. Electronically Signed   By: JDahlia BailiffM.D.   On: 03/04/2022 12:10  ? ?CT ABDOMEN PELVIS W CONTRAST ? ?Result Date: 03/28/2022 ?CLINICAL DATA:  Left lower quadrant abdominal pain. EXAM: CT ABDOMEN AND PELVIS WITH CONTRAST TECHNIQUE: Multidetector CT imaging of the abdomen and pelvis was performed using the standard protocol following bolus administration of intravenous contrast. RADIATION DOSE REDUCTION: This exam was performed according to the departmental dose-optimization program which includes automated exposure control, adjustment of the mA and/or kV according to patient size and/or use of iterative reconstruction technique. CONTRAST:  1059mOMNIPAQUE IOHEXOL 350 MG/ML SOLN COMPARISON:  CT abdomen and pelvis 11/11/2021. FINDINGS: Lower chest: No acute abnormality. Hepatobiliary: Patient is status post cholecystectomy. There is stable mild intra and extrahepatic biliary ductal dilatation. Liver is otherwise within normal limits. Pancreas: Unremarkable. No pancreatic ductal dilatation or surrounding inflammatory changes. Spleen: Normal in size without focal abnormality. Adrenals/Urinary Tract: Adrenal glands are unremarkable. Kidneys are normal, without renal calculi, focal  lesion, or hydronephrosis. Bladder is unremarkable. Stomach/Bowel: There are numerous postsurgical changes throughout the bowel including prior colonic and small-bowel resection. Multiple small bowel loops approximate the anterior abdominal wall similar to the prior study. There are numerous dilated small bowel loops in the mid and right abdomen with air-fluid levels measuring up to 4.4 cm. Air-fluid levels are also seen within the distal colon. No definitive transition point visualized. Stomach moderately distended. No focal wall thickening or pneumatosis identified. No free air. Vascular/Lymphatic: No significant vascular findings are present. No enlarged abdominal or pelvic lymph nodes. Reproductive: Status post hysterectomy. No adnexal masses. Other: There is no ascites or free air. There is scarring in the anterior abdominal wall in the lower abdomen. There is thinning of the upper anterior abdominal wall. Musculoskeletal: No acute or significant osseous findings. IMPRESSION: 1. Again seen are numerous postsurgical changes of the bowel including adhesive disease along the anterior abdominal wall. There are dilated small bowel loops without definite transition point worrisome for small bowel obstruction. Air-fluid levels in the colon can be seen in the setting of diarrhea. No focal wall inflammation or free air. Electronically Signed   By: AmRonney Asters.D.   On: 03/28/2022 18:29  ? ?CT CORONARY MORPH W/CTA COR W/SCORE W/CA W/CM &/OR WO/CM ? ?Addendum Date: 03/09/2022   ?ADDENDUM REPORT: 03/09/2022 08:29 EXAM: OVER-READ INTERPRETATION  CT CHEST The following report is an over-read performed by radiologist Dr. [facility] on 03/09/2022. This over-read does not include interpretation of cardiac or coronary anatomy or pathology. The coronary CTA interpretation by the cardiologist is attached. COMPARISON:  01/11/2019 FINDINGS: Vascular: No acute abnormality. Mediastinum/nodes:  No mass or  adenopathy identified. Lungs/pleura: No pleural effusion. Mild pleural thickening overlies the posterior lung bases. Scarring is noted within the lingula and both lower lung zones. No pulmonary nodule or mass identified. U

## 2022-03-30 NOTE — Progress Notes (Signed)
?PROGRESS NOTE ? ? ?[name]  IRW:[id number] DOB: 03-Jun-1961 DOA: 03/28/2022 ?PCP: Fayrene Helper, MD  ? ?Chief Complaint  ?Patient presents with  ? Near Syncope  ? ?Level of care: Med-Surg ? ?Brief Admission History:  ?61 y.o. female with medical history significant for complicated abdominal history at [facility], chronic diarrhea, hypertension, DVT and PE, fibromyalgia with chronic pain, atrial fibrillation status post ablation. ?Patient presented to the ED with complaints of nausea, and vomiting over the past 3 days with poor oral intake.  Baseline diarrhea which is unchanged.  Also reports abdominal bloating. ?She reports today she stood up from a sitting position and passed out.  Spouse at bedside today denies any jerking of patient extremities when she passed out, but patient reports she felt she had this. ?Patient has a history of gastroparesis and had EGD planned for 4/4, by Dr. Jenetta Downer. ?  ?Patient reports blood pressure intermittently down to 70s and 80s at home. ?  ?Patient's complicated abdominal history includes multiple abdominal surgeries  pertaining to a large rectus diastasis with abdominal hernia that began after an ovarian cyst removal that was converted to radical hysterectomy in 2012 at [facility]. This procedure was complicated by sepsis that required enterotomy and small bowel resection followed by chronic wound vac placement.  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  ?ED Course: Blood pressure soft, 1 reading of 74/64 ?  If accurate.  No vomiting episodes in the ED. ?- CT abdomen and pelvis with contrast shows numerous postsurgical changes to bowel including adhesive disease along the anterior abdominal wall, dilated small bowel loops without definite transition point worrisome for small bowel obstruction, air-fluid level in colon in setting  of diarrhea. ?-CTA chest no PE. ?- EDP talked with Dr. Arnoldo Morale, will see in the morning, okay for ice chips. ? ?03/29/2022:  Surgery evaluation completed.  Clear liquid diet started.  ? ?03/30/2022:  having small BMs, advancing to full liquid diet, holding blood thinners for GI procedure planned for 4/4.  ?  ?Assessment and Plan: ?* SBO (small bowel obstruction) ([facility]) ?Complicated bowel history including multiple abdominal surgeries-see HPI above.  Presenting with vomiting, right sided and lower abdominal cramps, chronic diarrhea unchanged.  ?- CT abdomen and pelvis with contrast shows numerous postsurgical changes to bowel including adhesive disease along the anterior abdominal wall, dilated small bowel loops without definite transition point worrisome for small bowel obstruction, air-fluid level in colon in setting of diarrhea. ?--Dr. Arnoldo Morale consulted and recommended advancing to full liquids today.   ?--continue supportive measures.  ? ?Generalized intestinal dysmotility ?--pt planning to have botox treatment with Dr. Jenetta Downer on 4/4.   ? ?Hypokalemia ?Potassium repleted.  ?-Low magnesium - IV replacement ordered and repleted.  ? ?Chronic diarrhea ?--working closely with GI, Dr. Jenetta Downer notified of admission ? ?History of pulmonary embolus (PE) ?History of PE and DVT on Xarelto.  CTA chest without PE.   ?-Hold Xarelto for now pending general surgery evaluation, and planned GI procedure on 4/4.  ? ?History of DVT (deep vein thrombosis) ?--rivaroxaban temporarily on hold  ? ?Essential hypertension ?Blood pressure soft. ?-Hold metoprolol with soft blood pressure in the setting of dehydration. ? ?Syncope ?In the setting of vomiting, chronic diarrhea, SBO, and dehydration.  Patient reports blood pressure down to 70s and 80s at home. Blood pressure soft In ED.  Similar  hospitalization 11/2021 with vomiting, dehydration and syncope. ?-  CTA negative for PE.  EKG, troponins unremarkable. ?-Last echo 10/2021-EF 60 to 65%,  normal LV diastolic parameters, no significant valvular abnormalities. ?-Pt has been ambulating halls.  ? ?Anxiety state ?Resume home Cymbalta ? ?DVT prophylaxis: enoxaparin ?Code Status: full  ?Family Communication:  ?Disposition: Status is: Inpatient ?Remains inpatient appropriate because: IV fluid required for treatment ?  ?Consultants:  ?surgery ?Procedures:  ?N/a ?Antimicrobials:  ?N/a  ?Subjective: ?Pt had some palpitations when she was ambulating the halls. She wants to restart her amitiza.  ?Objective: ?Vitals:  ? 03/29/22 1441 03/29/22 2017 03/30/22 0519 03/30/22 1129  ?BP: (!) 90/59 (!) 90/59 (!) 85/57 (!) 93/57  ?Pulse: 77 77 76 (!) 101  ?Resp: '20 15 17 17  '$ ?Temp:  98.8 ?F (37.1 ?C) 98.2 ?F (36.8 ?C)   ?TempSrc:  Oral Oral   ?SpO2: 97% 97% 99% 98%  ?Weight:      ?Height:      ? ? ?Intake/Output Summary (Last 24 hours) at 03/30/2022 1304 ?Last data filed at 03/29/2022 1700 ?Gross per 24 hour  ?Intake 2.24 ml  ?Output --  ?Net 2.24 ml  ? ?Filed Weights  ? 03/28/22 1611  ?Weight: 80.3 kg  ? ?Examination: ? ?General exam: Appears calm and comfortable  ?Respiratory system: Clear to auscultation. Respiratory effort normal. ?Cardiovascular system: normal S1 & S2 heard. No JVD, murmurs, rubs, gallops or clicks. No pedal edema. ?Gastrointestinal system: Abdomen is nondistended, soft and nontender. No organomegaly or masses felt. Normal bowel sounds heard. ?Central nervous system: Alert and oriented. No focal neurological deficits. ?Extremities: Symmetric 5 x 5 power. ?Skin: No rashes, lesions or ulcers. ?Psychiatry: Judgement and insight appear normal. Mood & affect appropriate.  ? ?Data Reviewed: I have personally reviewed following labs and imaging studies ? ?CBC: ?Recent Labs  ?Lab 03/26/22 ?3474 03/28/22 ?1642 03/29/22 ?2595  ?WBC 8.5 8.1 5.7  ?NEUTROABS 5.5 5.1  --   ?HGB 16.4* 16.5* 13.0  ?HCT 50.1* 48.7* 39.1  ?MCV 88.7 85.9 86.5  ?PLT 268 251 202  ? ? ?Basic Metabolic Panel: ?Recent Labs  ?Lab 03/26/22 ?6387  03/28/22 ?1642 03/28/22 ?1844 03/29/22 ?5643 03/29/22 ?3295 03/30/22 ?0448  ?NA 138 136  --  139  --  140  ?K 3.5 3.3*  --  3.5  --  3.7  ?CL 104 107  --  109  --  112*  ?CO2 23 19*  --  23  --  22  ?GLUCOSE 111* 124*  --  87  --  83  ?BUN 18 21*  --  16  --  12  ?CREATININE 0.72 0.84  --  0.68  --  0.50  ?CALCIUM 9.9 9.5  --  8.8*  --  8.2*  ?MG 1.9  --  1.7  --   --  1.9  ?PHOS 3.6  --   --   --  3.9  --   ? ? ?CBG: ?Recent Labs  ?Lab 03/28/22 ?1742  ?GLUCAP 96  ? ? ?No results found for this or any previous visit (from the past 240 hour(s)).  ? ?Radiology Studies: ?CT Angio Chest PE W and/or Wo Contrast ? ?Result Date: 03/28/2022 ?CLINICAL DATA:  Syncope today.  Evaluate for PE. EXAM: CT ANGIOGRAPHY CHEST WITH CONTRAST TECHNIQUE: Multidetector CT imaging of the chest was performed using the standard protocol during bolus administration of intravenous contrast. Multiplanar CT image reconstructions and MIPs were obtained to evaluate the vascular anatomy. RADIATION DOSE REDUCTION:  This exam was performed according to the departmental dose-optimization program which includes automated exposure control, adjustment of the mA and/or kV according to patient size and/or use of iterative reconstruction technique. CONTRAST:  164m OMNIPAQUE IOHEXOL 350 MG/ML SOLN COMPARISON:  None. FINDINGS: Cardiovascular: Satisfactory opacification of the pulmonary arteries to the segmental level. No evidence of pulmonary embolism. Normal heart size. No pericardial effusion. Mediastinum/Nodes: No enlarged mediastinal, hilar, or axillary lymph nodes. Thyroid gland, trachea, and esophagus demonstrate no significant findings. Lungs/Pleura: Stable scarring in the lingula. Additional thin linear scarring versus subsegmental atelectasis in the right lower and right middle lobes. No focal consolidation, pleural effusion, or pneumothorax. No pleural effusion or pneumothorax. Upper Abdomen: Mild to moderately distended fluid-filled stomach. Several  fluid-filled dilated small bowel loops are seen in the right upper abdomen. Please see today's dedicated CT abdomen pelvis report for further details. Cholecystectomy. Musculoskeletal: Partially visual

## 2022-03-30 NOTE — Assessment & Plan Note (Addendum)
--  pt s/p pylorus botox treatment with Dr. Jenetta Downer on 4/4.   ?--full liquid diet today per Dr. Jenetta Downer and advance as tolerated. ?--advanced diet to soft diet 4/5 which pt tolerated ?

## 2022-03-30 NOTE — Progress Notes (Signed)
?Subjective: ?Patient still with nonspecific abdominal pain.  She did have 3 small bowel movements yesterday evening.  She has not passed gas this morning.  She is tolerating clear liquid diet okay. ? ?Objective: ?Vital signs in last 24 hours: ?Temp:  [98.1 ?F (36.7 ?C)-98.8 ?F (37.1 ?C)] 98.2 ?F (36.8 ?C) (04/03 0519) ?Pulse Rate:  [76-81] 76 (04/03 0519) ?Resp:  [15-20] 17 (04/03 0519) ?BP: (71-90)/(48-61) 85/57 (04/03 0519) ?SpO2:  [93 %-99 %] 99 % (04/03 0519) ?Last BM Date : 03/29/22 ? ?Intake/Output from previous day: ?04/02 0701 - 04/03 0700 ?In: 2.2 [I.V.:2.2] ?Out: -  ?Intake/Output this shift: ?No intake/output data recorded. ? ?General appearance: alert, cooperative, and no distress ?GI: Soft with reducible incisional hernia present.  Bowel sounds active.  No specific point tenderness noted. ? ?Lab Results:  ?Recent Labs  ?  03/28/22 ?1642 03/29/22 ?0452  ?WBC 8.1 5.7  ?HGB 16.5* 13.0  ?HCT 48.7* 39.1  ?PLT 251 202  ? ?BMET ?Recent Labs  ?  03/29/22 ?0452 03/30/22 ?0448  ?NA 139 140  ?K 3.5 3.7  ?CL 109 112*  ?CO2 23 22  ?GLUCOSE 87 83  ?BUN 16 12  ?CREATININE 0.68 0.50  ?CALCIUM 8.8* 8.2*  ? ?PT/INR ?No results for input(s): LABPROT, INR in the last 72 hours. ? ?Studies/Results: ?CT Angio Chest PE W and/or Wo Contrast ? ?Result Date: 03/28/2022 ?CLINICAL DATA:  Syncope today.  Evaluate for PE. EXAM: CT ANGIOGRAPHY CHEST WITH CONTRAST TECHNIQUE: Multidetector CT imaging of the chest was performed using the standard protocol during bolus administration of intravenous contrast. Multiplanar CT image reconstructions and MIPs were obtained to evaluate the vascular anatomy. RADIATION DOSE REDUCTION: This exam was performed according to the departmental dose-optimization program which includes automated exposure control, adjustment of the mA and/or kV according to patient size and/or use of iterative reconstruction technique. CONTRAST:  178m OMNIPAQUE IOHEXOL 350 MG/ML SOLN COMPARISON:  None. FINDINGS:  Cardiovascular: Satisfactory opacification of the pulmonary arteries to the segmental level. No evidence of pulmonary embolism. Normal heart size. No pericardial effusion. Mediastinum/Nodes: No enlarged mediastinal, hilar, or axillary lymph nodes. Thyroid gland, trachea, and esophagus demonstrate no significant findings. Lungs/Pleura: Stable scarring in the lingula. Additional thin linear scarring versus subsegmental atelectasis in the right lower and right middle lobes. No focal consolidation, pleural effusion, or pneumothorax. No pleural effusion or pneumothorax. Upper Abdomen: Mild to moderately distended fluid-filled stomach. Several fluid-filled dilated small bowel loops are seen in the right upper abdomen. Please see today's dedicated CT abdomen pelvis report for further details. Cholecystectomy. Musculoskeletal: Partially visualized anterior cervical spinal fusion. No acute or suspicious osseous findings. Postoperative changes of left mastectomy and reconstruction. Right breast implant. Review of the MIP images confirms the above findings. IMPRESSION: No pulmonary embolism. Stable scarring in the lingula and lower lungs. Lungs are otherwise clear. Several dilated small bowel loops are seen in the right upper abdomen. Please see today's dedicated CT abdomen pelvis report for further details. Electronically Signed   By: LIleana RoupM.D.   On: 03/28/2022 18:21  ? ?CT ABDOMEN PELVIS W CONTRAST ? ?Result Date: 03/28/2022 ?CLINICAL DATA:  Left lower quadrant abdominal pain. EXAM: CT ABDOMEN AND PELVIS WITH CONTRAST TECHNIQUE: Multidetector CT imaging of the abdomen and pelvis was performed using the standard protocol following bolus administration of intravenous contrast. RADIATION DOSE REDUCTION: This exam was performed according to the departmental dose-optimization program which includes automated exposure control, adjustment of the mA and/or kV according to patient size and/or use of  iterative reconstruction  technique. CONTRAST:  127m OMNIPAQUE IOHEXOL 350 MG/ML SOLN COMPARISON:  CT abdomen and pelvis 11/11/2021. FINDINGS: Lower chest: No acute abnormality. Hepatobiliary: Patient is status post cholecystectomy. There is stable mild intra and extrahepatic biliary ductal dilatation. Liver is otherwise within normal limits. Pancreas: Unremarkable. No pancreatic ductal dilatation or surrounding inflammatory changes. Spleen: Normal in size without focal abnormality. Adrenals/Urinary Tract: Adrenal glands are unremarkable. Kidneys are normal, without renal calculi, focal lesion, or hydronephrosis. Bladder is unremarkable. Stomach/Bowel: There are numerous postsurgical changes throughout the bowel including prior colonic and small-bowel resection. Multiple small bowel loops approximate the anterior abdominal wall similar to the prior study. There are numerous dilated small bowel loops in the mid and right abdomen with air-fluid levels measuring up to 4.4 cm. Air-fluid levels are also seen within the distal colon. No definitive transition point visualized. Stomach moderately distended. No focal wall thickening or pneumatosis identified. No free air. Vascular/Lymphatic: No significant vascular findings are present. No enlarged abdominal or pelvic lymph nodes. Reproductive: Status post hysterectomy. No adnexal masses. Other: There is no ascites or free air. There is scarring in the anterior abdominal wall in the lower abdomen. There is thinning of the upper anterior abdominal wall. Musculoskeletal: No acute or significant osseous findings. IMPRESSION: 1. Again seen are numerous postsurgical changes of the bowel including adhesive disease along the anterior abdominal wall. There are dilated small bowel loops without definite transition point worrisome for small bowel obstruction. Air-fluid levels in the colon can be seen in the setting of diarrhea. No focal wall inflammation or free air. Electronically Signed   By: ARonney Asters M.D.   On: 03/28/2022 18:29   ? ?Anti-infectives: ?Anti-infectives (From admission, onward)  ? ? Start     Dose/Rate Route Frequency Ordered Stop  ? 03/29/22 1700  Erythromycin TBEC 250 mg       ? 250 mg Oral 3 times daily before meals 03/29/22 1310    ? ?  ? ? ?Assessment/Plan: ?Impression: Abdominal symptoms most likely related to her bowel dysmotility.  Mechanical small bowel obstruction less likely. ?Plan: Patient is scheduled for EGD with Botox injection tomorrow by Dr. CJenetta Downer  GI is aware of her admission.  Will advance to full liquid diet.  Discussed with Dr. JWynetta Emery ? LOS: 1 day  ? ? ?MAviva Signs?03/30/2022  ?

## 2022-03-30 NOTE — TOC Initial Note (Signed)
Transition of Care (TOC) - Initial/Assessment Note  ? ? ?Patient Details  ?Name: [name] ?MRN: [id number] ?Date of Birth: 08/28/1961 ? ?Transition of Care (TOC) CM/SW Contact:    ?Salome Arnt, LCSW ?Phone Number: ?03/30/2022, 1:41 PM ? ?Clinical Narrative:  Pt admitted due to small bowel obstruction. Assessment completed for high risk readmission score. Pt reports she lives with her husband and is independent with ADLs. She plans to return home when medically stable. Pt has PCP and transportation to appointments. PT evaluation pending. TOC will continue to follow for d/c planning needs.                ? ? ?Expected Discharge Plan: Home/Self Care ?Barriers to Discharge: Continued Medical Work up ? ? ?Patient Goals and CMS Choice ?Patient states their goals for this hospitalization and ongoing recovery are:: return home ?  ?Choice offered to / list presented to : Patient ? ?Expected Discharge Plan and Services ?Expected Discharge Plan: Home/Self Care ?In-house Referral: Clinical Social Work ?  ?  ?Living arrangements for the past 2 months: [place] ?                ?  ?  ?  ?  ?  ?  ?  ?  ?  ?  ? ?Prior Living Arrangements/Services ?Living arrangements for the past 2 months: [place] ?Lives with:: Spouse ?Patient language and need for interpreter reviewed:: Yes ?Do you feel safe going back to the place where you live?: Yes      ?Need for Family Participation in Patient Care: No (Comment) ?  ?  ?Criminal Activity/Legal Involvement Pertinent to Current Situation/Hospitalization: No - Comment as needed ? ?Activities of Daily Living ?Home Assistive Devices/Equipment: None ?ADL Screening (condition at time of admission) ?Patient's cognitive ability adequate to safely complete daily activities?: Yes ?Is the patient deaf or have difficulty hearing?: No ?Does the patient have difficulty seeing, even when wearing glasses/contacts?: No ?Does the patient have difficulty concentrating,  remembering, or making decisions?: No ?Patient able to express need for assistance with ADLs?: Yes ?Does the patient have difficulty dressing or bathing?: No ?Independently performs ADLs?: Yes (appropriate for developmental age) ?Does the patient have difficulty walking or climbing stairs?: No ?Weakness of Legs: None ?Weakness of Arms/Hands: None ? ?Permission Sought/Granted ?  ?  ?   ?   ?   ?   ? ?Emotional Assessment ?  ?  ?Affect (typically observed): Appropriate ?Orientation: : Oriented to Self, Oriented to Place, Oriented to  Time, Oriented to Situation ?Alcohol / Substance Use: Not Applicable ?Psych Involvement: No (comment) ? ?Admission diagnosis:  Dehydration [E86.0] ?SBO (small bowel obstruction) ([place]) [K56.609] ?Near syncope [R55] ?Nausea and vomiting, unspecified vomiting type [R11.2] ?Small bowel obstruction ([place]) [K56.609] ?Patient Active Problem List  ? Diagnosis Date Noted  ? Generalized intestinal dysmotility   ? SBO (small bowel obstruction) ([place]) 03/28/2022  ? Dysphagia 12/04/2021  ? Hypokalemia   ? Hypomagnesemia   ? Dark stools   ? Anemia   ? Abnormal small bowel motility 11/28/2021  ? Gastroparesis 11/28/2021  ? Abnormal CT scan, small bowel   ? Dilation of small bowel anastomosis   ? Intractable vomiting 11/12/2021  ? Bilious vomiting with nausea 11/11/2021  ? Diarrhea 11/11/2021  ? Dilation of biliary tract 11/11/2021  ? 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 Hematuria 07/18/2021  ? Fever 07/18/2021  ? Dehydration 07/18/2021  ? Bloating 04/17/2021  ? Chronic diarrhea 04/17/2021  ?  ADHD, predominantly hyperactive type 01/20/2021  ? Muscle spasm 01/20/2021  ? Ovarian cancer ([place]) 12/03/2020  ? Long-term current use of opiate analgesic 10/22/2020  ? Migraine 10/22/2020  ? Muscle spasms of both lower extremities 01/03/2020  ? Hip pain, right 07/08/2019  ? Low back pain radiating to right leg 07/04/2019  ? Abdominal pain 06/06/2019  ? B12 deficiency 10/11/2018  ? Other specified abnormal  findings of blood chemistry 10/11/2018  ? Malabsorption 09/23/2018  ? 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, left ([place]) 09/23/2018  ? Embedded foreign body 03/01/2018  ? Intermittent palpitations 08/22/2017  ? Essential hypertension 08/22/2017  ? Snoring 12/21/2016  ? Bilateral hand pain 07/27/2016  ? History of DVT (deep vein thrombosis) 03/30/2016  ? History of pulmonary embolus (PE) 03/30/2016  ? S/P insertion of IVC (inferior vena caval) filter 03/05/2016  ? Headache syndrome 01/26/2016  ? Syncope 01/23/2016  ? Spinal stenosis of lumbar region with radiculopathy 08/28/2015  ? Vitamin D deficiency 05/05/2015  ? Uncontrolled pain 04/15/2015  ? Right flank pain 09/07/2014  ? Nausea and vomiting 07/17/2014  ? Chronic right-sided low back pain with bilateral sciatica 07/17/2014  ? Chest pain 06/14/2014  ? Low ferritin level 06/14/2014  ? Adjustment disorder with anxiety 08/31/2013  ? Irregular heart rate 07/25/2013  ? GERD (gastroesophageal reflux disease) 05/10/2013  ? Seasonal allergies 05/10/2013  ? Morbid obesity ([place]) 03/27/2013  ? Encounter for monitoring opioid maintenance therapy 01/16/2013  ? Chronic pain syndrome 01/16/2013  ? Fatigue 09/17/2012  ? Ventral hernia 08/03/2012  ? Stress incontinence 02/18/2012  ? Insomnia 12/15/2011  ? Postsurgical menopause 11/13/2011  ? Interstitial cystitis 02/17/2011  ? Metabolic syndrome X 63/12/6008  ? CARPAL TUNNEL SYNDROME, RIGHT 06/09/2010  ? OVARIAN CYST 12/26/2009  ? Iron deficiency anemia 08/07/2008  ? Hx SBO 08/07/2008  ? Paget's disease of breast, left ([place]) 03/16/2008  ? Hypothyroidism 03/16/2008  ? Hyperlipidemia LDL goal <100 03/16/2008  ? Anxiety state 03/16/2008  ? Whole body pain 03/16/2008  ? FIBROMYALGIA 03/16/2008  ? ?PCP:  Fayrene Helper, MD ?Pharmacy:   ?[facility], [place] AT [address] ?[place] ?[place] [address] ?Phone: [phone] Fax:  [phone] ? ? ? ? ?Social Determinants of Health (SDOH) Interventions ?  ? ?Readmission Risk Interventions ? ?  03/30/2022  ?  1:37 PM  ?Readmission Risk Prevention Plan  ?Transportation Screening Complete  ?[place] or Home Care Consult Complete  ?Social Work Consult for [place] Planning/Counseling Complete  ?Palliative Care Screening Not Applicable  ?Medication Review Press photographer) Complete  ? ? ? ?

## 2022-03-30 NOTE — Progress Notes (Signed)
?   03/30/22 1129  ?Vitals  ?BP (!) 93/57  ?MAP (mmHg) 69  ?Pulse Rate (!) 101  ?Pulse Rate Source Monitor  ?Resp 17  ?Level of Consciousness  ?Level of Consciousness Alert  ?MEWS COLOR  ?MEWS Score Color Yellow  ?Oxygen Therapy  ?SpO2 98 %  ?MEWS Score  ?MEWS Temp 0  ?MEWS Systolic 1  ?MEWS Pulse 1  ?MEWS RR 0  ?MEWS LOC 0  ?MEWS Score 2  ?Provider Notification  ?Provider Name/Title Irwin Brakeman MD  ?Date Provider Notified 03/30/22  ?Time Provider Notified 1130  ?Method of Notification Page ?(Chat)  ?Notification Reason Other (Comment) ?(yellow MEWS)  ?Provider response No new orders  ?Date of Provider Response 03/30/22  ?Time of Provider Response 1131  ? ? ?

## 2022-03-31 ENCOUNTER — Encounter (HOSPITAL_COMMUNITY)
Admission: EM | Disposition: A | Discharge status: Home / Self Care | Payer: Self-pay | Source: Home / Self Care | Attending: Family Medicine

## 2022-03-31 ENCOUNTER — Inpatient Hospital Stay (HOSPITAL_COMMUNITY): Payer: Medicare Other | Admitting: Anesthesiology

## 2022-03-31 ENCOUNTER — Encounter (HOSPITAL_COMMUNITY): Payer: Self-pay | Admitting: Family Medicine

## 2022-03-31 ENCOUNTER — Ambulatory Visit (HOSPITAL_COMMUNITY): Admission: RE | Admit: 2022-03-31 | Payer: Medicare Other | Source: Home / Self Care | Admitting: Gastroenterology

## 2022-03-31 DIAGNOSIS — K3184 Gastroparesis: Secondary | ICD-10-CM

## 2022-03-31 DIAGNOSIS — I10 Essential (primary) hypertension: Secondary | ICD-10-CM

## 2022-03-31 DIAGNOSIS — K2289 Other specified disease of esophagus: Secondary | ICD-10-CM

## 2022-03-31 DIAGNOSIS — K529 Noninfective gastroenteritis and colitis, unspecified: Secondary | ICD-10-CM | POA: Diagnosis not present

## 2022-03-31 DIAGNOSIS — K56609 Unspecified intestinal obstruction, unspecified as to partial versus complete obstruction: Secondary | ICD-10-CM | POA: Diagnosis not present

## 2022-03-31 DIAGNOSIS — K317 Polyp of stomach and duodenum: Secondary | ICD-10-CM

## 2022-03-31 DIAGNOSIS — E860 Dehydration: Secondary | ICD-10-CM | POA: Diagnosis not present

## 2022-03-31 HISTORY — PX: BOTOX INJECTION: SHX5754

## 2022-03-31 HISTORY — PX: POLYPECTOMY: SHX5525

## 2022-03-31 HISTORY — PX: ESOPHAGOGASTRODUODENOSCOPY (EGD) WITH PROPOFOL: SHX5813

## 2022-03-31 HISTORY — PX: BIOPSY: SHX5522

## 2022-03-31 SURGERY — ESOPHAGOGASTRODUODENOSCOPY (EGD) WITH PROPOFOL
Anesthesia: General

## 2022-03-31 SURGERY — ESOPHAGOGASTRODUODENOSCOPY (EGD) WITH PROPOFOL
Anesthesia: Monitor Anesthesia Care

## 2022-03-31 MED ORDER — SODIUM CHLORIDE (PF) 0.9 % IJ SOLN
INTRAMUSCULAR | Status: DC | PRN
  Administered 2022-03-31: 4 mL via SUBMUCOSAL

## 2022-03-31 MED ORDER — PROPOFOL 10 MG/ML IV BOLUS
INTRAVENOUS | Status: DC | PRN
  Administered 2022-03-31: 50 mg via INTRAVENOUS
  Administered 2022-03-31: 40 mg via INTRAVENOUS
  Administered 2022-03-31: 50 mg via INTRAVENOUS
  Administered 2022-03-31: 100 mg via INTRAVENOUS
  Administered 2022-03-31: 30 mg via INTRAVENOUS

## 2022-03-31 MED ORDER — LIDOCAINE HCL (CARDIAC) PF 50 MG/5ML IV SOSY
PREFILLED_SYRINGE | INTRAVENOUS | Status: DC | PRN
  Administered 2022-03-31: 50 mg via INTRAVENOUS

## 2022-03-31 MED ORDER — METOCLOPRAMIDE HCL 5 MG/ML IJ SOLN
10.0000 mg | Freq: Once | INTRAMUSCULAR | Status: AC
  Administered 2022-03-31: 10 mg via INTRAVENOUS

## 2022-03-31 MED ORDER — RIVAROXABAN 20 MG PO TABS
20.0000 mg | ORAL_TABLET | Freq: Every day | ORAL | Status: DC
  Administered 2022-03-31 – 2022-04-01 (×2): 20 mg via ORAL
  Filled 2022-03-31 (×2): qty 1

## 2022-03-31 MED ORDER — METOCLOPRAMIDE HCL 5 MG/ML IJ SOLN
INTRAMUSCULAR | Status: AC
  Filled 2022-03-31: qty 2

## 2022-03-31 MED ORDER — LACTATED RINGERS IV SOLN: INTRAVENOUS | Status: DC

## 2022-03-31 MED ORDER — PHENYLEPHRINE HCL (PRESSORS) 10 MG/ML IV SOLN
INTRAVENOUS | Status: DC | PRN
Start: 2022-03-31 — End: 2022-03-31
  Administered 2022-03-31 (×2): 80 ug via INTRAVENOUS

## 2022-03-31 MED ORDER — ONABOTULINUMTOXINA 100 UNITS IJ SOLR
INTRAMUSCULAR | Status: AC
  Filled 2022-03-31: qty 100

## 2022-03-31 MED ORDER — ALPRAZOLAM 1 MG PO TABS
1.0000 mg | ORAL_TABLET | Freq: Two times a day (BID) | ORAL | Status: DC | PRN
  Administered 2022-03-31 – 2022-04-01 (×2): 1 mg via ORAL
  Filled 2022-03-31 (×2): qty 1

## 2022-03-31 NOTE — Progress Notes (Signed)
Day of Surgery  ?Subjective: ?Patient had multiple bowel movements yesterday evening.  Is about to undergo EGD with Botox injection. ? ?Objective: ?Vital signs in last 24 hours: ?Temp:  [98.1 ?F (36.7 ?C)-99.1 ?F (37.3 ?C)] 98.4 ?F (36.9 ?C) (04/04 7494) ?Pulse Rate:  [73-101] 73 (04/04 0842) ?Resp:  [16-20] 16 (04/04 0842) ?BP: (86-118)/(48-75) 118/75 (04/04 4967) ?SpO2:  [95 %-98 %] 98 % (04/04 0842) ?Last BM Date : 03/29/22 ? ?Intake/Output from previous day: ?No intake/output data recorded. ?Intake/Output this shift: ?No intake/output data recorded. ? ?General appearance: alert, cooperative, and no distress ?GI: Soft, easily reducible incisional hernia present.  No rigidity is noted. ? ?Lab Results:  ?Recent Labs  ?  03/28/22 ?1642 03/29/22 ?0452  ?WBC 8.1 5.7  ?HGB 16.5* 13.0  ?HCT 48.7* 39.1  ?PLT 251 202  ? ?BMET ?Recent Labs  ?  03/29/22 ?0452 03/30/22 ?0448  ?NA 139 140  ?K 3.5 3.7  ?CL 109 112*  ?CO2 23 22  ?GLUCOSE 87 83  ?BUN 16 12  ?CREATININE 0.68 0.50  ?CALCIUM 8.8* 8.2*  ? ?PT/INR ?No results for input(s): LABPROT, INR in the last 72 hours. ? ?Studies/Results: ?No results found. ? ?Anti-infectives: ?Anti-infectives (From admission, onward)  ? ? Start     Dose/Rate Route Frequency Ordered Stop  ? 03/29/22 1700  [MAR Hold]  Erythromycin TBEC 250 mg        (MAR Hold since Tue 03/31/2022 at [facility].Hold Reason: Transfer to a Procedural area)  ? 250 mg Oral 3 times daily before meals 03/29/22 1310    ? ?  ? ? ?Assessment/Plan: ?s/p Procedure(s): ?ESOPHAGOGASTRODUODENOSCOPY (EGD) WITH PROPOFOL ?BOTOX INJECTION ?Pression: Gastroparesis and bowel dysmotility.  This is being addressed by Dr. Jenetta Downer of GI.  No need for surgical intervention at this time.  We will sign off. ? LOS: 2 days  ? ? ?Aviva Signs ?03/31/2022  ?

## 2022-03-31 NOTE — Progress Notes (Signed)
Patient alert and verbal. Had some complaints of pain this shift, given PRN pain medication, see MAR. Patient stated medication was effective. Patient ambulated in room with no complaints of discomfort. No complaints of nausea or vomiting this shift. Will continue to monitor patient. ?

## 2022-03-31 NOTE — Transfer of Care (Signed)
Immediate Anesthesia Transfer of Care Note ? ?Patient: [name] ? ?Procedure(s) Performed: ESOPHAGOGASTRODUODENOSCOPY (EGD) WITH PROPOFOL ?BOTOX INJECTION ?BIOPSY ?POLYPECTOMY ? ?Patient Location: PACU ? ?Anesthesia Type:MAC ? ?Level of Consciousness: awake, alert , oriented and patient cooperative ? ?Airway & Oxygen Therapy: Patient Spontanous Breathing and Patient connected to nasal cannula oxygen ? ?Post-op Assessment: Report given to RN, Post -op Vital signs reviewed and stable and Patient moving all extremities ? ?Post vital signs: Reviewed and stable ? ?Last Vitals:  ?Vitals Value Taken Time  ?BP 99/62 03/31/22 0953  ?Temp    ?Pulse 76 03/31/22 0954  ?Resp 17 03/31/22 0954  ?SpO2 100 % 03/31/22 0954  ?Vitals shown include unvalidated device data. ? ?Last Pain:  ?Vitals:  ? 03/31/22 0929  ?TempSrc:   ?PainSc: 8   ?   ? ?  ? ?Complications: No notable events documented. ?

## 2022-03-31 NOTE — Anesthesia Procedure Notes (Signed)
Date/Time: 03/31/2022 9:28 AM ?Performed by: Vista Deck, CRNA ?Pre-anesthesia Checklist: Patient identified, Emergency Drugs available, Suction available, Timeout performed and Patient being monitored ?Patient Re-evaluated:Patient Re-evaluated prior to induction ?Oxygen Delivery Method: Nasal Cannula ? ? ? ? ?

## 2022-03-31 NOTE — Brief Op Note (Signed)
03/28/2022 - 03/31/2022 ? ?10:00 AM ? ?PATIENT:  [name]  61 y.o. female ? ?PRE-OPERATIVE DIAGNOSIS:  Gastroparesis ? ?POST-OPERATIVE DIAGNOSIS:  GE junction biopsies; BOTOX injection; duodenal polyp (CS); salmon colored tongue above GE junction;  ? ?PROCEDURE:  Procedure(s) with comments: ?ESOPHAGOGASTRODUODENOSCOPY (EGD) WITH PROPOFOL (N/A) - 1015 ?BOTOX INJECTION (N/A) ?BIOPSY ?POLYPECTOMY ? ?SURGEON:  Surgeon(s) and Role: ?   Harvel Quale, MD - Primary ? ?Patient underwent EGD under propofol sedation.  Tolerated the procedure adequately.  One tongue of salmon-colored mucosa was present from 39 to 40 cm. There was an isolated squamous island lateral to the tongue. No other visible abnormalities were present.  The maximum longitudinal extent of these esophageal mucosal changes was 1 cm in length.  Biopsies were taken with a cold forceps for histology. The entire examined stomach was normal. Pylorus was widely patent. Pylorus was successfully injected with 100 units botulinum toxin (25 units in each quadrant). A single 2 mm sessile polyp with no bleeding was found in the second portion of the duodenum.  The polyp was removed with a cold snare.  Resection and retrieval were complete.  ? ?RECOMMENDATIONS ?- Return patient to hospital ward for ongoing care.  ?- Full liquid diet today, advance as tolerated.  ?- Await pathology results.  ? ?Maylon Peppers, MD ?Gastroenterology and Hepatology ?[facility] ? ?

## 2022-03-31 NOTE — Anesthesia Preprocedure Evaluation (Signed)
Anesthesia Evaluation  ?Patient identified by MRN, date of birth, ID band ?Patient awake ? ? ? ?Reviewed: ?Allergy & Precautions, NPO status , Patient's Chart, lab work & pertinent test results ? ?History of Anesthesia Complications ?(+) PONV, DIFFICULT AIRWAY, Family history of anesthesia reaction and history of anesthetic complications ? ?Airway ?Mallampati: III ? ?TM Distance: >3 FB ?Neck ROM: Full ? ? ? Dental ? ?(+) Dental Advisory Given, Teeth Intact ?  ?Pulmonary ?PE ?  ?Pulmonary exam normal ?breath sounds clear to auscultation ? ? ? ? ? ? Cardiovascular ?Exercise Tolerance: Poor ?hypertension, Pt. on medications ?+ angina Normal cardiovascular exam+ dysrhythmias Supra Ventricular Tachycardia  ?Rhythm:Regular Rate:Normal ? ? ?  ?Neuro/Psych ? Headaches, PSYCHIATRIC DISORDERS Anxiety Depression TIA Neuromuscular disease CVA   ? GI/Hepatic ?GERD  Medicated and Controlled,EXP LAP, Small bowel ressection ?  ?Endo/Other  ?Hypothyroidism  ? Renal/GU ?Renal disease  ? ?  ?Musculoskeletal ? ?(+) Arthritis  (ACDF), Fibromyalgia - ? Abdominal ?  ?Peds ? Hematology ? ?(+) Blood dyscrasia, anemia ,   ?Anesthesia Other Findings ? ? Reproductive/Obstetrics ? ?  ? ? ? ? ? ? ? ? ? ? ? ? ? ?  ?  ? ? ? ? ? ? ? ? ?Anesthesia Physical ? ?Anesthesia Plan ? ?ASA: 3 ? ?Anesthesia Plan: General  ? ?Post-op Pain Management:   ? ?Induction: Intravenous ? ?PONV Risk Score and Plan: Propofol infusion and Metaclopromide ? ?Airway Management Planned: Nasal Cannula and Natural Airway ? ?Additional Equipment:  ? ?Intra-op Plan:  ? ?Post-operative Plan: Possible Post-op intubation/ventilation ? ?Informed Consent: I have reviewed the patients History and Physical, chart, labs and discussed the procedure including the risks, benefits and alternatives for the proposed anesthesia with the patient or authorized representative who has indicated his/her understanding and acceptance.  ? ? ? ?Dental advisory  given ? ?Plan Discussed with: CRNA and Surgeon ? ?Anesthesia Plan Comments: (Possible GA with airway was explained.)  ? ? ? ? ? ?Anesthesia Quick Evaluation ? ?

## 2022-03-31 NOTE — Assessment & Plan Note (Signed)
-  treated with IV fluids and now tolerating liquid diet  ?

## 2022-03-31 NOTE — Plan of Care (Signed)

## 2022-03-31 NOTE — Anesthesia Postprocedure Evaluation (Signed)
Anesthesia Post Note ? ?Patient: [name] ? ?Procedure(s) Performed: ESOPHAGOGASTRODUODENOSCOPY (EGD) WITH PROPOFOL ?BOTOX INJECTION ?BIOPSY ?POLYPECTOMY ? ?Patient location during evaluation: Endoscopy ?Anesthesia Type: General ?Level of consciousness: awake and alert and oriented ?Pain management: pain level controlled ?Vital Signs Assessment: post-procedure vital signs reviewed and stable ?Respiratory status: spontaneous breathing, nonlabored ventilation and respiratory function stable ?Cardiovascular status: blood pressure returned to baseline and stable ?Postop Assessment: no apparent nausea or vomiting ?Anesthetic complications: no ? ? ?No notable events documented. ? ? ?Last Vitals:  ?Vitals:  ? 03/31/22 1015 03/31/22 1040  ?BP:  90/68  ?Pulse:  75  ?Resp:  20  ?Temp:  36.6 ?C  ?SpO2: 99% 100%  ?  ?Last Pain:  ?Vitals:  ? 03/31/22 1059  ?TempSrc:   ?PainSc: 8   ? ? ?  ?  ?  ?  ?  ?  ? ?Kryssa Risenhoover C Loreley Schwall ? ? ? ? ?

## 2022-03-31 NOTE — Progress Notes (Signed)
We will proceed with EGD with Botox injection as scheduled.  I thoroughly discussed with the patient his procedure, including the risks involved. Patient understands what the procedure involves including the benefits and any risks. Patient understands alternatives to the proposed procedure. Risks including (but not limited to) bleeding, tearing of the lining (perforation), rupture of adjacent organs, problems with heart and lung function, infection, and medication reactions. A small percentage of complications may require surgery, hospitalization, repeat endoscopic procedure, and/or transfusion.  Patient understood and agreed. ? ?Maylon Peppers, MD ?Gastroenterology and Hepatology ?[facility] ? ?

## 2022-03-31 NOTE — Progress Notes (Signed)
?PROGRESS NOTE ? ? ?[name]  YBW:[id number] DOB: 09/12/1961 DOA: 03/28/2022 ?PCP: Fayrene Helper, MD  ? ?Chief Complaint  ?Patient presents with  ? Near Syncope  ? ?Level of care: Med-Surg ? ?Brief Admission History:  ?61 y.o. female with medical history significant for complicated abdominal history at [facility], chronic diarrhea, hypertension, DVT and PE, fibromyalgia with chronic pain, atrial fibrillation status post ablation. ?Patient presented to the ED with complaints of nausea, and vomiting over the past 3 days with poor oral intake.  Baseline diarrhea which is unchanged.  Also reports abdominal bloating. ?She reports today she stood up from a sitting position and passed out.  Spouse at bedside today denies any jerking of patient extremities when she passed out, but patient reports she felt she had this. ?Patient has a history of gastroparesis and had EGD planned for 4/4, by Dr. Jenetta Downer. ?  ?Patient reports blood pressure intermittently down to 70s and 80s at home. ?  ?Patient's complicated abdominal history includes multiple abdominal surgeries  pertaining to a large rectus diastasis with abdominal hernia that began after an ovarian cyst removal that was converted to radical hysterectomy in 2012 at [facility]. This procedure was complicated by sepsis that required enterotomy and small bowel resection followed by chronic wound vac placement.  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  ?ED Course: Blood pressure soft, 1 reading of 74/64 ?  If accurate.  No vomiting episodes in the ED. ?- CT abdomen and pelvis with contrast shows numerous postsurgical changes to bowel including adhesive disease along the anterior abdominal wall, dilated small bowel loops without definite transition point worrisome for small bowel obstruction, air-fluid level in colon in setting  of diarrhea. ?-CTA chest no PE. ?- EDP talked with Dr. Arnoldo Morale, will see in the morning, okay for ice chips. ? ?03/29/2022:  Surgery evaluation completed.  Clear liquid diet started.  ? ?03/30/2022:  having small BMs, advancing to full liquid diet, holding blood thinners for GI procedure planned for 4/4.  Amitiza restarted.  ? ?03/31/2022: EGD with pylorus botox injection with Dr. Jenetta Downer. Full liquid diet today.  Anticipate possible DC tomorrow.   ?  ?Assessment and Plan: ?* SBO (small bowel obstruction) ([place]) ?Complicated bowel history including multiple abdominal surgeries-see HPI above.  Presenting with vomiting, right sided and lower abdominal cramps, chronic diarrhea unchanged.  ?- CT abdomen and pelvis with contrast shows numerous postsurgical changes to bowel including adhesive disease along the anterior abdominal wall, dilated small bowel loops without definite transition point worrisome for small bowel obstruction, air-fluid level in colon in setting of diarrhea. ?--Dr. Arnoldo Morale consulted and recommended advancing diet as tolerated.  Pt having multiple bowel movements.  ?--continue supportive measures.   ? ?Generalized intestinal dysmotility ?--pt s/p pylorus botox treatment with Dr. Jenetta Downer on 4/4.   ?--full liquid diet today per Dr. Jenetta Downer and advance as tolerated. ?--anticipating discharge home 4/5 if tolerating diet  ? ?Hypokalemia ?Potassium repleted.  ?-Low magnesium - IV replacement ordered and repleted.  ? ?Dehydration ?-treated with IV fluids and now tolerating liquid diet  ? ?Chronic diarrhea ?--working closely with GI, Dr. Jenetta Downer with GI  ? ?History of pulmonary embolus (PE) ?History of PE and DVT on Xarelto.  CTA chest without PE.   ?-Hold Xarelto temporarily for planned GI procedure on 4/4.  ? ?History of DVT (deep vein  thrombosis) ?--rivaroxaban temporarily on hold, will check with GI to see if we can restart tonight with supper.   ? ?Essential hypertension ?Blood pressure  soft. ?-temporarily holding metoprolol with soft blood pressures. ? ?Syncope ?In the setting of vomiting, chronic diarrhea, SBO, and dehydration.  Patient reports blood pressure down to 70s and 80s at home. Blood pressure soft In ED.  Similar hospitalization 11/2021 with vomiting, dehydration and syncope. ?-  CTA negative for PE.  EKG, troponins unremarkable. ?-Last echo 10/2021-EF 60 to 65%, normal LV diastolic parameters, no significant valvular abnormalities. ?-Pt has been ambulating halls.  ? ?Anxiety state ?Resume home Cymbalta ? ?DVT prophylaxis: enoxaparin ?Code Status: full  ?Family Communication:  ?Disposition: Status is: Inpatient ?Remains inpatient appropriate because: IV fluid required for treatment ?  ?Consultants:  ?surgery ?Procedures:  ?N/a ?Antimicrobials:  ?N/a  ?Subjective: ?Pt had some palpitations when she was ambulating the halls. She wants to restart her amitiza.  ?Objective: ?Vitals:  ? 03/31/22 0842 03/31/22 1000 03/31/22 1012 03/31/22 1015  ?BP: 118/75 98/65    ?Pulse: 73 76 78   ?Resp: '16 16 16   '$ ?Temp: 98.4 ?F (36.9 ?C) 98.2 ?F (36.8 ?C) 98.1 ?F (36.7 ?C)   ?TempSrc: Oral     ?SpO2: 98% 100% 99% 99%  ?Weight:      ?Height:      ? ? ?Intake/Output Summary (Last 24 hours) at 03/31/2022 1023 ?Last data filed at 03/31/2022 2836 ?Gross per 24 hour  ?Intake 200 ml  ?Output --  ?Net 200 ml  ? ?Filed Weights  ? 03/28/22 1611  ?Weight: 80.3 kg  ? ?Examination: ? ?General exam: Appears calm and comfortable  ?Respiratory system: Clear to auscultation. Respiratory effort normal. ?Cardiovascular system: normal S1 & S2 heard. No JVD, murmurs, rubs, gallops or clicks. No pedal edema. ?Gastrointestinal system: Abdomen is scarred diffusely, mildly distended, soft and nontender. No organomegaly or masses felt. Normal bowel sounds heard. ?Central nervous system: Alert and oriented. No focal neurological deficits. ?Extremities: Symmetric 5 x 5 power. ?Skin: No rashes, lesions or ulcers. ?Psychiatry: Judgement  and insight appear normal. Mood & affect appropriate.  ? ?Data Reviewed: I have personally reviewed following labs and imaging studies ? ?CBC: ?Recent Labs  ?Lab 03/26/22 ?6294 03/28/22 ?1642 03/29/22 ?7654  ?WBC 8.5 8.1 5.7  ?NEUTROABS 5.5 5.1  --   ?HGB 16.4* 16.5* 13.0  ?HCT 50.1* 48.7* 39.1  ?MCV 88.7 85.9 86.5  ?PLT 268 251 202  ? ? ?Basic Metabolic Panel: ?Recent Labs  ?Lab 03/26/22 ?6503 03/28/22 ?1642 03/28/22 ?1844 03/29/22 ?5465 03/29/22 ?6812 03/30/22 ?0448  ?NA 138 136  --  139  --  140  ?K 3.5 3.3*  --  3.5  --  3.7  ?CL 104 107  --  109  --  112*  ?CO2 23 19*  --  23  --  22  ?GLUCOSE 111* 124*  --  87  --  83  ?BUN 18 21*  --  16  --  12  ?CREATININE 0.72 0.84  --  0.68  --  0.50  ?CALCIUM 9.9 9.5  --  8.8*  --  8.2*  ?MG 1.9  --  1.7  --   --  1.9  ?PHOS 3.6  --   --   --  3.9  --   ? ? ?CBG: ?Recent Labs  ?Lab 03/28/22 ?1742  ?GLUCAP 96  ? ? ?No results found for this or any previous visit (from the past 240 hour(s)).  ? ?  Radiology Studies: ?No results found. ? ?Scheduled Meds: ? [MAR Hold] aspirin  81 mg Oral Daily  ? botulinum toxin Type A      ? [MAR Hold] DULoxetine  60 mg Oral Daily  ? [MAR Hold] enoxaparin (LOVENOX) injection  40 mg Subcutaneous Q24H  ? [MAR Hold] Erythromycin  250 mg Oral TID AC  ? [MAR Hold] levothyroxine  125 mcg Oral QAC breakfast  ? [MAR Hold] lubiprostone  24 mcg Oral BID WC  ? [MAR Hold] pravastatin  40 mg Oral QHS  ? ?Continuous Infusions: ? 0.9 % NaCl with KCl 20 mEq / L 75 mL/hr at 03/31/22 0709  ? lactated ringers 50 mL/hr at 03/31/22 0905  ? ? ? LOS: 2 days  ? ?Time spent: 36 mins ? ?Irwin Brakeman, MD ?How to contact the [facility] Attending or Consulting provider [place] or covering provider during after hours [place], for this patient?  ?Check the care team in [facility] and look for a) attending/consulting [facility] provider listed and b) the [facility] team listed ?Log into [email or web address] and use [place]'s universal password to access. If you do not have the password, please contact  the hospital operator. ?Locate the [facility] provider you are looking for under [facility] and page to a number that you can be directly reached. ?If you still have difficulty reaching the provider, please page the D

## 2022-03-31 NOTE — Op Note (Addendum)
[facility] ?Patient Name: [name] ?Procedure Date: 03/31/2022 9:13 AM ?MRN: [id number] ?Date of Birth: 1961/06/23 ?Attending MD: Maylon Peppers ,  ?CSN: [id number] ?Age: 62 ?Admit Type: Inpatient ?Procedure:                Upper GI endoscopy ?Indications:              For therapy of gastroparesis ?Providers:                Maylon Peppers, [place] Page, [place] Cyndi Bender  ?                          Tech, Technician ?Referring MD:              ?Medicines:                Monitored Anesthesia Care ?Complications:            No immediate complications. ?Estimated Blood Loss:     Estimated blood loss: none. ?Procedure:                Pre-Anesthesia Assessment: ?                          - Prior to the procedure, a History and Physical  ?                          was performed, and patient medications, allergies  ?                          and sensitivities were reviewed. [name]  ?                          tolerance of previous anesthesia was reviewed. ?                          - The risks and benefits of the procedure and the  ?                          sedation options and risks were discussed with the  ?                          patient. All questions were answered and informed  ?                          consent was obtained. ?                          - ASA Grade Assessment: III - A patient with severe  ?                          systemic disease. ?                          After obtaining informed consent, the endoscope was  ?                          passed under direct vision. Throughout the  ?  procedure, the patient's blood pressure, pulse, and  ?                          oxygen saturations were monitored continuously. The  ?                          GIF-H190 ([id number]) scope was introduced through the  ?                          mouth, and advanced to the second part of duodenum.  ?                          The upper GI endoscopy was accomplished without  ?                           difficulty. The patient tolerated the procedure  ?                          well. ?Scope In: 9:35:09 AM ?Scope Out: 9:49:03 AM ?Total Procedure Duration: 0 hours 13 minutes 54 seconds  ?Findings: ?     One tongue of salmon-colored mucosa was present from 39 to 40 cm. There  ?     was an isolated squamous island lateral to the tongue. No other visible  ?     abnormalities were present. The maximum longitudinal extent of these  ?     esophageal mucosal changes was 1 cm in length. Biopsies were taken with  ?     a cold forceps for histology. ?     The entire examined stomach was normal. Pylorus was widely patent.  ?     Pylorus was successfully injected with 100 units botulinum toxin (25  ?     units in each quadrant). ?     A single 2 mm sessile polyp with no bleeding was found in the second  ?     portion of the duodenum. The polyp was removed with a cold snare.  ?     Resection and retrieval were complete. ?Impression:               - Salmon-colored mucosa suspicious for  ?                          short-segment Barrett's esophagus. Biopsied. ?                          - Normal stomach. Injected with botulinum toxin. ?                          - A single duodenal polyp. Resected and retrieved. ?Moderate Sedation: ?     Per Anesthesia Care ?Recommendation:           - Return patient to hospital ward for ongoing care. ?                          - Full liquid diet today, advance as tolerated. ?                          -  Await pathology results. ?Procedure Code(s):        --- Professional --- ?                          [phone], Esophagogastroduodenoscopy, flexible,  ?                          transoral; with removal of tumor(s), polyp(s), or  ?                          other lesion(s) by snare technique ?                          43239, 59, Esophagogastroduodenoscopy, flexible,  ?                          transoral; with biopsy, single or multiple ?                          43236, 59,  Esophagogastroduodenoscopy, flexible,  ?                          transoral; with directed submucosal injection(s),  ?                          any substance ?Diagnosis Code(s):        --- Professional --- ?                          K22.8, Other specified diseases of esophagus ?                          K31.7, Polyp of stomach and duodenum ?                          K31.84, Gastroparesis ?CPT copyright 2019 American Medical Association. All rights reserved. ?The codes documented in this report are preliminary and upon coder review may  ?be revised to meet current compliance requirements. ?Maylon Peppers, MD ?Maylon Peppers,  ?03/31/2022 9:58:48 AM ?This report has been signed electronically. ?Number of Addenda: 0 ?

## 2022-04-01 DIAGNOSIS — K3184 Gastroparesis: Secondary | ICD-10-CM | POA: Diagnosis not present

## 2022-04-01 DIAGNOSIS — I10 Essential (primary) hypertension: Secondary | ICD-10-CM | POA: Diagnosis not present

## 2022-04-01 DIAGNOSIS — M7918 Myalgia, other site: Secondary | ICD-10-CM

## 2022-04-01 DIAGNOSIS — R110 Nausea: Secondary | ICD-10-CM

## 2022-04-01 DIAGNOSIS — K56609 Unspecified intestinal obstruction, unspecified as to partial versus complete obstruction: Secondary | ICD-10-CM | POA: Diagnosis not present

## 2022-04-01 DIAGNOSIS — K529 Noninfective gastroenteritis and colitis, unspecified: Secondary | ICD-10-CM | POA: Diagnosis not present

## 2022-04-01 LAB — SURGICAL PATHOLOGY

## 2022-04-01 MED ORDER — LIDOCAINE 5 % EX PTCH
1.0000 | MEDICATED_PATCH | CUTANEOUS | Status: DC
  Administered 2022-04-01: 1 via TRANSDERMAL
  Filled 2022-04-01: qty 1

## 2022-04-01 NOTE — Progress Notes (Signed)
?  ?       ?PROGRESS NOTE ? ?[name] XBD:[id number] DOB: [birth year] DOA: 03/28/2022 ?PCP: Fayrene Helper, MD ? ?Brief History:  ?61 y.o. female with medical history significant for complicated abdominal history at [facility], chronic diarrhea, hypertension, DVT and PE, fibromyalgia with chronic pain, atrial fibrillation status post ablation. ?Patient presented to the ED with complaints of nausea, and vomiting over the past 3 days with poor oral intake.  Baseline diarrhea which is unchanged.  Also reports abdominal bloating. ?She reports today she stood up from a sitting position and passed out.  Spouse at bedside today denies any jerking of patient extremities when she passed out, but patient reports she felt she had this. ?Patient has a history of gastroparesis and had EGD planned for 4/4, by Dr. Jenetta Downer. ?  ?Patient reports blood pressure intermittently down to 70s and 80s at home. ?  ?Patient's complicated abdominal history includes multiple abdominal surgeries  pertaining to a large rectus diastasis with abdominal hernia that began after an ovarian cyst removal that was converted to radical hysterectomy in 2012 at [facility]. This procedure was complicated by sepsis that required enterotomy and small bowel resection followed by chronic wound vac placement.  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  ?ED Course: Blood pressure soft, 1 reading of 74/64 ?  If accurate.  No vomiting episodes in the ED. ?- CT abdomen and pelvis with contrast shows numerous postsurgical changes to bowel including adhesive disease along the anterior abdominal wall, dilated small bowel loops without definite transition point worrisome for small bowel obstruction, air-fluid level in colon in setting of diarrhea. ?-CTA chest no PE. ?- EDP talked with Dr. Arnoldo Morale, will see in the morning,  okay for ice chips. ? ?03/29/2022:  Surgery evaluation completed.  Clear liquid diet started.  ? ?03/30/2022:  having small BMs, advancing to full liquid diet, holding blood thinners for GI procedure planned for 4/4.  Amitiza restarted.  ? ?03/31/2022: EGD with pylorus botox injection with Dr. Jenetta Downer. Full liquid diet today.   ? ?04/01/22:  pt complains of myalgias in thighs.  Did not want to eat today despite advanced to soft diet.  Had one BM  ? ? ?Assessment and Plan: ?* SBO (small bowel obstruction) ([place]) ?Complicated bowel history including multiple abdominal surgeries-see HPI above.  Presenting with vomiting, right sided and lower abdominal cramps, chronic diarrhea unchanged.  ?--complicated past GI history including partial colectomy due to redundant colon with primary anastomosis,  perforated bowel r/t gynecologic procedure complicated by enterocutaneous fistula that required multiple surgeries and multiple SBOs with recent admission for partial SBO 10/25/21, admitted again in Nov due to ongoing nausea, vomiting, diarrhea and abdominal pain with suspected pseudo-obstruction, possibly gastroparesis, and small bowel dysmotility  ?- CT abdomen and pelvis with contrast shows numerous postsurgical changes to bowel including adhesive disease along the anterior abdominal wall, dilated small bowel loops without definite transition point worrisome for small bowel obstruction, air-fluid level in colon in setting of diarrhea. ?--Dr. Arnoldo Morale consulted and recommended advancing diet as tolerated.  Pt having multiple bowel movements.  ?--continue nonoperative management ?-pt is having BMs ? ?Syncope ?In the setting of vomiting, chronic diarrhea, SBO, and dehydration.  Patient reports blood pressure down to 70s and 80s at home. Blood pressure soft In ED.  Similar hospitalization 11/2021 with vomiting, dehydration and  syncope. ?-  CTA negative for PE.  EKG, troponins unremarkable. ?-Last echo 10/2021-EF 60 to 65%, normal LV  diastolic parameters, no significant valvular abnormalities. ?-Pt has been ambulating halls.  ? ?Myalgia, lower leg ?Check CK. Aldolase ?Venous duplex ? ?Generalized intestinal dysmotility ?--pt s/p pylorus botox treatment with Dr. Jenetta Downer on 4/4.   ?--full liquid diet today per Dr. Jenetta Downer and advance as tolerated. ?--advanced diet to soft diet 4/5 ? ?Hypokalemia ?Potassium repleted.  ?-Low magnesium - IV replacement ordered and repleted.  ?-BMP in am ? ?Chronic diarrhea ?--working closely with GI, Dr. Jenetta Downer with GI  ? ?History of pulmonary embolus (PE) ?History of PE and DVT on Xarelto.  CTA chest without PE.   ?-Hold Xarelto temporarily for planned GI procedure on 4/4.  ?-Xarelto restarted 03/31/22 pm ? ?History of DVT (deep vein thrombosis) ?--rivaroxaban temporarily on hold, will check with GI to see if we can restart tonight with supper.   ? ?Essential hypertension ?Blood pressure soft. ?-temporarily holding metoprolol with soft blood pressures. ? ?Anxiety state ?Resume home Cymbalta and alprazolam ?-PDMP reviewed ?-pt receives alprazolam '1mg'$ , #60 monthly ? ? ? ? ? ?Family Communication:   no Family at bedside ? ?Consultants:  GI, general surgery ? ?Code Status:  FULL  ? ?DVT Prophylaxis: Xarelto ? ? ?Procedures: ?As Listed in Progress Note Above ? ?Antibiotics: ?None ? ? ? ? ? ?Subjective: ? ?Pt states abd pain is overall improving.  Complains of leg myalgia.  Denies n/v/d, cp, sob. ?Objective: ?Vitals:  ? 03/31/22 2004 04/01/22 0456 04/01/22 1425 04/01/22 1717  ?BP: 97/64 (!) 81/49 (!) 77/51 106/69  ?Pulse: 90 70 79 70  ?Resp: '19 19 16   '$ ?Temp: 98.9 ?F (37.2 ?C) 98.2 ?F (36.8 ?C) 98.8 ?F (37.1 ?C)   ?TempSrc: Oral  Oral   ?SpO2: 95% 92% 94%   ?Weight:      ?Height:      ? ?No intake or output data in the 24 hours ending 04/01/22 1811 ?Weight change:  ?Exam: ? ?General:  Pt is alert, follows commands appropriately, not in acute distress ?HEENT: No icterus, No thrush, No neck mass, [place]/AT ?Cardiovascular:  RRR, S1/S2, no rubs, no gallops ?Respiratory: CTA bilaterally, no wheezing, no crackles, no rhonchi ?Abdomen: Soft/+BS, non tender, non distended, no guarding ?Extremities: No edema, No lymphangitis, No petechiae, No rashes, no synovitis ? ? ?Data Reviewed: ?I have personally reviewed following labs and imaging studies ?Basic Metabolic Panel: ?Recent Labs  ?Lab 03/26/22 ?3267 03/28/22 ?1642 03/28/22 ?1844 03/29/22 ?1245 03/29/22 ?8099 03/30/22 ?0448  ?NA 138 136  --  139  --  140  ?K 3.5 3.3*  --  3.5  --  3.7  ?CL 104 107  --  109  --  112*  ?CO2 23 19*  --  23  --  22  ?GLUCOSE 111* 124*  --  87  --  83  ?BUN 18 21*  --  16  --  12  ?CREATININE 0.72 0.84  --  0.68  --  0.50  ?CALCIUM 9.9 9.5  --  8.8*  --  8.2*  ?MG 1.9  --  1.7  --   --  1.9  ?PHOS 3.6  --   --   --  3.9  --   ? ?Liver Function Tests: ?Recent Labs  ?Lab 03/26/22 ?8338 03/28/22 ?1642  ?AST 51* 61*  ?ALT 80* 71*  ?ALKPHOS 140* 137*  ?BILITOT 0.7 0.7  ?PROT 7.3 7.1  ?ALBUMIN 4.2 4.0  ? ?  Recent Labs  ?Lab 03/28/22 ?1642  ?LIPASE 28  ? ?No results for input(s): AMMONIA in the last 168 hours. ?Coagulation Profile: ?No results for input(s): INR, PROTIME in the last 168 hours. ?CBC: ?Recent Labs  ?Lab 03/26/22 ?8676 03/28/22 ?1642 03/29/22 ?1950  ?WBC 8.5 8.1 5.7  ?NEUTROABS 5.5 5.1  --   ?HGB 16.4* 16.5* 13.0  ?HCT 50.1* 48.7* 39.1  ?MCV 88.7 85.9 86.5  ?PLT 268 251 202  ? ?Cardiac Enzymes: ?No results for input(s): CKTOTAL, CKMB, CKMBINDEX, TROPONINI in the last 168 hours. ?BNP: ?Invalid input(s): POCBNP ?CBG: ?Recent Labs  ?Lab 03/28/22 ?1742  ?GLUCAP 96  ? ?HbA1C: ?No results for input(s): HGBA1C in the last 72 hours. ?Urine analysis: ?   ?Component Value Date/Time  ? [place] YELLOW 03/28/2022 1816  ? APPEARANCEUR CLEAR 03/28/2022 1816  ? LABSPEC 1.023 03/28/2022 1816  ? PHURINE 6.0 03/28/2022 1816  ? GLUCOSEU NEGATIVE 03/28/2022 1816  ? GLUCOSEU NEG mg/dL 09/05/2007 2337  ? [place] NEGATIVE 03/28/2022 1816  ? HGBUR negative 03/13/2011 0957  ?  [place] NEGATIVE 03/28/2022 1816  ? BILIRUBINUR small (A) 07/18/2021 1411  ? BILIRUBINUR neg 06/23/2018 1434  ? [place] NEGATIVE 03/28/2022 1816  ? PROTEINUR NEGATIVE 03/28/2022 1816  ? UROBILINOGEN 4.0 (

## 2022-04-01 NOTE — Care Management Important Message (Signed)
Important Message ? ?Patient Details  ?Name: [name] ?MRN: [id number] ?Date of Birth: 1961-02-23 ? ? ?Medicare Important Message Given:  Yes ? ? ? ? ?Tommy Medal ?04/01/2022, 11:50 AM ?

## 2022-04-01 NOTE — Progress Notes (Signed)
?   04/01/22 1425  ?Vitals  ?Temp 98.8 ?F (37.1 ?C)  ?Temp Source Oral  ?BP (!) 77/51  ?BP Location Right Wrist  ?BP Method Automatic  ?Patient Position (if appropriate) Lying  ?Pulse Rate 79  ?Pulse Rate Source Monitor  ?Resp 16  ?MEWS COLOR  ?MEWS Score Color Yellow  ?Oxygen Therapy  ?SpO2 94 %  ?O2 Device Room Air  ?MEWS Score  ?MEWS Temp 0  ?MEWS Systolic 2  ?MEWS Pulse 0  ?MEWS RR 0  ?MEWS LOC 0  ?MEWS Score 2  ?Provider Notification  ?Provider Name/Title Estill Dooms. Thayer Jew  ?Date Provider Notified 04/01/22  ?Time Provider Notified [phone]  ?Method of Notification  ?(Chat)  ?Provider response No new orders  ?Date of Provider Response 04/01/22  ?Time of Provider Response 1507  ? ? ?

## 2022-04-01 NOTE — Assessment & Plan Note (Addendum)
Check CK--17 ?Venous duplex--neg for DVT ?

## 2022-04-01 NOTE — Progress Notes (Signed)
?  Subjective: ?Patient reports ongoing pain in her legs today, feels that pain is "to the bone." States she continues to have some nausea and vomited up some water earlier. States 1 small BM this morning that was watery, this is baseline for her. she tolerated liquid diet yesterday, did not have anything this morning because it was cold. She would like to try some food to see how she does.  ? ?Objective: ?Vital signs in last 24 hours: ?Temp:  [97.9 ?F (36.6 ?C)-98.9 ?F (37.2 ?C)] 98.2 ?F (36.8 ?C) (04/05 0456) ?Pulse Rate:  [70-90] 70 (04/05 0456) ?Resp:  [16-20] 19 (04/05 0456) ?BP: (81-124)/(49-91) 81/49 (04/05 0456) ?SpO2:  [92 %-100 %] 92 % (04/05 0456) ?Last BM Date : 03/31/22 ?General:   Alert and oriented, pleasant ?Head:  Normocephalic and atraumatic. ?Eyes:  No icterus, sclera clear. Conjuctiva pink.  ?Mouth:  Without lesions, mucosa pink and moist.  ?Heart:  S1, S2 present, no murmurs noted.  ?Lungs: Clear to auscultation bilaterally, without wheezing, rales, or rhonchi.  ?Abdomen:  Bowel sounds present, soft, multiple old surgical scars, protruding incisional hernia. TTP at hernia site and RUQ. ?Msk:  Symmetrical without gross deformities. Normal posture. ?Pulses:  Normal pulses noted. ?Extremities:  Without clubbing or edema. ?Neurologic:  Alert and  oriented x4;  grossly normal neurologically. ?Skin:  Warm and dry, intact without significant lesions.  ?Psych:  Alert and cooperative. Normal mood and affect. ? ?Intake/Output from previous day: ?04/04 0701 - 04/05 0700 ?In: 200 [I.V.:200] ?Out: -  ? ?Lab Results: ?No results for input(s): WBC, HGB, HCT, PLT in the last 72 hours. ?BMET ?Recent Labs  ?  03/30/22 ?0448  ?NA 140  ?K 3.7  ?CL 112*  ?CO2 22  ?GLUCOSE 83  ?BUN 12  ?CREATININE 0.50  ?CALCIUM 8.2*  ? ?Assessment: ?[name] is a 61 year old female with complicated past GI history including partial colectomy due to redundant colon with primary anastomosis,  perforated bowel r/t gynecologic  procedure complicated by enterocutaneous fistula that required multiple surgeries and multiple SBOs with recent admission for partial SBO 10/25/21, admitted again in Nov due to ongoing nausea, vomiting, diarrhea and abdominal pain with suspected pseudo-obstruction, possibly gastroparesis, and small bowel dysmotility with most recent hospitalization 12/02/21 for syncopal episode, ongoing nausea, vomiting and abdominal distention. GES as outpatient with documented gastroparesis. ?  ?Admitted 4/1 due to SBO and has clinically improved this admission, Surgery following. EGD with Botox treatment performed yesterday, as initially planned as outpatient for 4/4. EGD findings to include: - Salmon-colored mucosa suspicious for short-segment Barrett's esophagus. Biopsied. Normal stomach. Injected with botulinum toxin. A single duodenal polyp. Resected and retrieved. Path still pending. ? ?Maintained on Amitiza 84mg BID which works well for her. Feeling some better today. Still having nausea. Would like to try some food to see if she tolerates.  ? ?Plan: ?Advance to soft diet ?Follow for pathology results ?Continue with nausea and pain management per hospitalist ?Continue amitiza BID ? ? LOS: 3 days  ? ? 04/01/2022, 9:14 AM ? ?[name] L. CAlver Sorrow MSN, APRN, AGNP-C ?Adult-Gerontology Nurse Practitioner ?[facility] ? ? ?

## 2022-04-02 ENCOUNTER — Telehealth: Payer: Self-pay | Admitting: Gastroenterology

## 2022-04-02 ENCOUNTER — Encounter (HOSPITAL_COMMUNITY): Payer: Self-pay | Admitting: Gastroenterology

## 2022-04-02 ENCOUNTER — Telehealth (INDEPENDENT_AMBULATORY_CARE_PROVIDER_SITE_OTHER): Payer: Self-pay | Admitting: *Deleted

## 2022-04-02 ENCOUNTER — Inpatient Hospital Stay (HOSPITAL_COMMUNITY): Payer: Medicare Other

## 2022-04-02 DIAGNOSIS — K3184 Gastroparesis: Secondary | ICD-10-CM | POA: Diagnosis not present

## 2022-04-02 DIAGNOSIS — K56609 Unspecified intestinal obstruction, unspecified as to partial versus complete obstruction: Secondary | ICD-10-CM | POA: Diagnosis not present

## 2022-04-02 DIAGNOSIS — E876 Hypokalemia: Secondary | ICD-10-CM | POA: Diagnosis not present

## 2022-04-02 DIAGNOSIS — I10 Essential (primary) hypertension: Secondary | ICD-10-CM | POA: Diagnosis not present

## 2022-04-02 DIAGNOSIS — F1120 Opioid dependence, uncomplicated: Secondary | ICD-10-CM

## 2022-04-02 LAB — COMPREHENSIVE METABOLIC PANEL
ALT: 77 U/L — ABNORMAL HIGH (ref 0–44)
AST: 38 U/L (ref 15–41)
Albumin: 3.1 g/dL — ABNORMAL LOW (ref 3.5–5.0)
Alkaline Phosphatase: 106 U/L (ref 38–126)
Anion gap: 10 (ref 5–15)
BUN: 6 mg/dL (ref 6–20)
CO2: 28 mmol/L (ref 22–32)
Calcium: 9 mg/dL (ref 8.9–10.3)
Chloride: 104 mmol/L (ref 98–111)
Creatinine, Ser: 0.71 mg/dL (ref 0.44–1.00)
GFR, Estimated: >60 mL/min (ref >60)
Glucose, Bld: 98 mg/dL (ref 70–99)
Potassium: 3.6 mmol/L (ref 3.5–5.1)
Sodium: 142 mmol/L (ref 135–145)
Total Bilirubin: 0.9 mg/dL (ref 0.3–1.2)
Total Protein: 5.5 g/dL — ABNORMAL LOW (ref 6.5–8.1)

## 2022-04-02 LAB — CBC
HCT: 40.4 % (ref 36.0–46.0)
Hemoglobin: 13.3 g/dL (ref 12.0–15.0)
MCH: 29.5 pg (ref 26.0–34.0)
MCHC: 32.9 g/dL (ref 30.0–36.0)
MCV: 89.6 fL (ref 80.0–100.0)
Platelets: 183 10*3/uL (ref 150–400)
RBC: 4.51 MIL/uL (ref 3.87–5.11)
RDW: 14 % (ref 11.5–15.5)
WBC: 5.2 10*3/uL (ref 4.0–10.5)
nRBC: 0 % (ref 0.0–0.2)

## 2022-04-02 LAB — CK: Total CK: 17 U/L — ABNORMAL LOW (ref 38–234)

## 2022-04-02 LAB — SEDIMENTATION RATE: Sed Rate: 8 mm/hr (ref 0–22)

## 2022-04-02 NOTE — Progress Notes (Addendum)
? ?Gastroenterology Progress Note  ? ?Referring Provider: No ref. provider found ?Primary Care Physician:  Fayrene Helper, MD ?Primary Gastroenterologist:  Dr. Jenetta Downer ? ?Patient ID: [name]; [id number]; Mar 20, 1961  ? ? ?Subjective  ? ?Patient reports she had a large bowel movement this morning and had some relief of abdominal discomfort afterwards. She reports that she has had extreme improvement in her constipation since being on Amatiza. She reports that she has still had some nausea and one vomiting episode but no longer has the choking feeling she used to have. She was able to tolerate some chicken yesterday and today. Some of her food had some gravy on it that she stated made her feel sick so she did not eat that but was also able to eat some greens at lunch without significant issue.  ? ? ?Objective  ? ?Vital signs in last 24 hours ?Temp:  [98.2 ?F (36.8 ?C)-98.8 ?F (37.1 ?C)] 98.2 ?F (36.8 ?C) (04/06 0451) ?Pulse Rate:  [70-93] 81 (04/06 0451) ?Resp:  [16-20] 20 (04/06 0451) ?BP: (77-116)/(51-90) 94/56 (04/06 0451) ?SpO2:  [94 %-96 %] 96 % (04/06 0451) ?Last BM Date : 04/01/22 ? ?Physical Exam ?General:   Alert and oriented, pleasant ?Head:  Normocephalic and atraumatic. ?Eyes:  No icterus, sclera clear. Conjuctiva pink.  ?Mouth:  Without lesions, mucosa pink and moist.  ?Neck:  Supple, without thyromegaly or masses.  ?Heart:  S1, S2 present, no murmurs noted.  ?Lungs: Clear to auscultation bilaterally, without wheezing, rales, or rhonchi.  ?Abdomen:  Bowel sounds present, soft, tenderness to mid abdomen, palpable loops of bowel, No HSM or hernias noted. No rebound or guarding. No masses appreciated  ?Msk:  Symmetrical without gross deformities. Normal posture. ?Pulses:  Normal pulses noted. ?Extremities:  Without clubbing or edema. ?Neurologic:  Alert and  oriented x4;  grossly normal neurologically. ?Skin:  Warm and dry, intact without significant lesions.  ?Cervical Nodes:  No  significant cervical adenopathy. ?Psych:  Alert and cooperative. Normal mood and affect. ? ?Intake/Output from previous day: ?No intake/output data recorded. ?Intake/Output this shift: ?No intake/output data recorded. ? ?Lab Results ? ?Recent Labs  ?  04/02/22 ?0710  ?WBC 5.2  ?HGB 13.3  ?HCT 40.4  ?PLT 183  ? ?BMET ?Recent Labs  ?  04/02/22 ?0710  ?NA 142  ?K 3.6  ?CL 104  ?CO2 28  ?GLUCOSE 98  ?BUN 6  ?CREATININE 0.71  ?CALCIUM 9.0  ? ?LFT ?Recent Labs  ?  04/02/22 ?0710  ?PROT 5.5*  ?ALBUMIN 3.1*  ?AST 38  ?ALT 77*  ?ALKPHOS 106  ?BILITOT 0.9  ? ?PT/INR ?No results for input(s): LABPROT, INR in the last 72 hours. ?Hepatitis Panel ?No results for input(s): HEPBSAG, HCVAB, HEPAIGM, HEPBIGM in the last 72 hours. ? ?Studies/Results ?CT Angio Chest PE W and/or Wo Contrast ? ?Result Date: 03/28/2022 ?CLINICAL DATA:  Syncope today.  Evaluate for PE. EXAM: CT ANGIOGRAPHY CHEST WITH CONTRAST TECHNIQUE: Multidetector CT imaging of the chest was performed using the standard protocol during bolus administration of intravenous contrast. Multiplanar CT image reconstructions and MIPs were obtained to evaluate the vascular anatomy. RADIATION DOSE REDUCTION: This exam was performed according to the departmental dose-optimization program which includes automated exposure control, adjustment of the mA and/or kV according to patient size and/or use of iterative reconstruction technique. CONTRAST:  163m OMNIPAQUE IOHEXOL 350 MG/ML SOLN COMPARISON:  None. FINDINGS: Cardiovascular: Satisfactory opacification of the pulmonary arteries to the segmental level. No evidence of pulmonary embolism. Normal heart  size. No pericardial effusion. Mediastinum/Nodes: No enlarged mediastinal, hilar, or axillary lymph nodes. Thyroid gland, trachea, and esophagus demonstrate no significant findings. Lungs/Pleura: Stable scarring in the lingula. Additional thin linear scarring versus subsegmental atelectasis in the right lower and right middle lobes. No  focal consolidation, pleural effusion, or pneumothorax. No pleural effusion or pneumothorax. Upper Abdomen: Mild to moderately distended fluid-filled stomach. Several fluid-filled dilated small bowel loops are seen in the right upper abdomen. Please see today's dedicated CT abdomen pelvis report for further details. Cholecystectomy. Musculoskeletal: Partially visualized anterior cervical spinal fusion. No acute or suspicious osseous findings. Postoperative changes of left mastectomy and reconstruction. Right breast implant. Review of the MIP images confirms the above findings. IMPRESSION: No pulmonary embolism. Stable scarring in the lingula and lower lungs. Lungs are otherwise clear. Several dilated small bowel loops are seen in the right upper abdomen. Please see today's dedicated CT abdomen pelvis report for further details. Electronically Signed   By: Ileana Roup M.D.   On: 03/28/2022 18:21  ? ?NM Gastric Emptying ? ?Result Date: 03/04/2022 ?CLINICAL DATA:  Nausea and vomiting concern for gastroparesis. EXAM: NUCLEAR MEDICINE GASTRIC EMPTYING SCAN TECHNIQUE: After oral ingestion of radiolabeled meal, sequential abdominal images were obtained for 4 hours. Percentage of activity emptying the stomach was calculated at 1 hour, 2 hour, 3 hour, and 4 hours. RADIOPHARMACEUTICALS:  2.2 mCi Tc-56msulfur colloid in standardized meal COMPARISON:  November 11, 2021 CT FINDINGS: Expected location of the stomach in the left upper quadrant. Ingested meal empties the stomach gradually over the course of the study. 5% emptied at 1 hr ( normal >= 10%) 12% emptied at 2 hr ( normal >= 40%) 20% emptied at 3 hr ( normal >= 70%) 28% emptied at 4 hr ( normal >= 90%) IMPRESSION: Scintigraphic findings consistent with delayed gastric emptying. Electronically Signed   By: JDahlia BailiffM.D.   On: 03/04/2022 12:10  ? ?CT ABDOMEN PELVIS W CONTRAST ? ?Result Date: 03/28/2022 ?CLINICAL DATA:  Left lower quadrant abdominal pain. EXAM: CT  ABDOMEN AND PELVIS WITH CONTRAST TECHNIQUE: Multidetector CT imaging of the abdomen and pelvis was performed using the standard protocol following bolus administration of intravenous contrast. RADIATION DOSE REDUCTION: This exam was performed according to the departmental dose-optimization program which includes automated exposure control, adjustment of the mA and/or kV according to patient size and/or use of iterative reconstruction technique. CONTRAST:  1066mOMNIPAQUE IOHEXOL 350 MG/ML SOLN COMPARISON:  CT abdomen and pelvis 11/11/2021. FINDINGS: Lower chest: No acute abnormality. Hepatobiliary: Patient is status post cholecystectomy. There is stable mild intra and extrahepatic biliary ductal dilatation. Liver is otherwise within normal limits. Pancreas: Unremarkable. No pancreatic ductal dilatation or surrounding inflammatory changes. Spleen: Normal in size without focal abnormality. Adrenals/Urinary Tract: Adrenal glands are unremarkable. Kidneys are normal, without renal calculi, focal lesion, or hydronephrosis. Bladder is unremarkable. Stomach/Bowel: There are numerous postsurgical changes throughout the bowel including prior colonic and small-bowel resection. Multiple small bowel loops approximate the anterior abdominal wall similar to the prior study. There are numerous dilated small bowel loops in the mid and right abdomen with air-fluid levels measuring up to 4.4 cm. Air-fluid levels are also seen within the distal colon. No definitive transition point visualized. Stomach moderately distended. No focal wall thickening or pneumatosis identified. No free air. Vascular/Lymphatic: No significant vascular findings are present. No enlarged abdominal or pelvic lymph nodes. Reproductive: Status post hysterectomy. No adnexal masses. Other: There is no ascites or free air. There is scarring in the  anterior abdominal wall in the lower abdomen. There is thinning of the upper anterior abdominal wall. Musculoskeletal:  No acute or significant osseous findings. IMPRESSION: 1. Again seen are numerous postsurgical changes of the bowel including adhesive disease along the anterior abdominal wall. There are dilated small bowel loops w

## 2022-04-02 NOTE — Assessment & Plan Note (Signed)
PDMP reviewed ?-pt receives dilaudid 4 mg, #90 monthly ?

## 2022-04-02 NOTE — Telephone Encounter (Signed)
Patient in hospital and left a message for Dr. Jenetta Downer or Dr. Laural Golden to call her.  ?[phone]. I called patient back to ask what she needed and she said she is having terrible leg pain and pain med wearing off in 2 -3 hours and they are not wanting to give more often due to bp dropping. She asked if there was a test that could be done to find out cause of leg pain. I told her I saw where an ultrasound of her leg is scheduled for today and asked her if she had talked with her nurse at hospital and the hospitalist and she said yes. I told her I would send a message to make Dr. Jenetta Downer aware but that leg pain is not a GI issue. She verbalized understanding.  ?

## 2022-04-02 NOTE — Discharge Summary (Signed)
?Physician Discharge Summary ?  ?Patient: [name] MRN: [id number] DOB: March 05, 1961  ?Admit date:     03/28/2022  ?Discharge date: 04/02/22  ?Discharge Physician: Shanon Brow Jadalyn Oliveri  ? ?PCP: Fayrene Helper, MD  ? ?Recommendations at discharge:  ? Please follow up with primary care provider within 1-2 weeks ? Please repeat BMP and CBC in one week ? ? ? ? ?Hospital Course: ?61 y.o. female with medical history significant for complicated abdominal history at [facility], chronic diarrhea, hypertension, DVT and PE, fibromyalgia with chronic pain, atrial fibrillation status post ablation. ?Patient presented to the ED with complaints of nausea, and vomiting over the past 3 days with poor oral intake.  Baseline diarrhea which is unchanged.  Also reports abdominal bloating. ?She reports today she stood up from a sitting position and passed out.  Spouse at bedside today denies any jerking of patient extremities when she passed out, but patient reports she felt she had this. ?Patient has a history of gastroparesis and had EGD planned for 4/4, by Dr. Jenetta Downer. ?  ?Patient reports blood pressure intermittently down to 70s and 80s at home. ?  ?Patient's complicated abdominal history includes multiple abdominal surgeries  pertaining to a large rectus diastasis with abdominal hernia that began after an ovarian cyst removal that was converted to radical hysterectomy in 2012 at [facility]. This procedure was complicated by sepsis that required enterotomy and small bowel resection followed by chronic wound vac placement.  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  ?ED Course: Blood pressure soft, 1 reading of 74/64 ?  If accurate.  No vomiting episodes in the ED. ?- CT abdomen and pelvis with contrast shows numerous postsurgical changes to bowel including adhesive disease along the anterior  abdominal wall, dilated small bowel loops without definite transition point worrisome for small bowel obstruction, air-fluid level in colon in setting of diarrhea. ?-CTA chest no PE. ?- EDP talked with Dr. Arnoldo Morale, will see in the morning, okay for ice chips. ? ?03/29/2022:  Surgery evaluation completed.  Clear liquid diet started.  ? ?03/30/2022:  having small BMs, advancing to full liquid diet, holding blood thinners for GI procedure planned for 4/4.  Amitiza restarted.  ? ?03/31/2022: EGD with pylorus botox injection with Dr. Jenetta Downer. Full liquid diet today.   ? ?04/01/22:  pt complains of myalgias in thighs.  Did not want to eat today despite advanced to soft diet.  Had one BM ? ?04/02/22:  myalgia in thighs a little better.  Able to ambulate.  Abd pain overall slowly improving.  Tolerating soft diet without n/v.  Had one BM in last 24 hours.  Stable for d/c from GI standpoint. ? ?Assessment and Plan: ?* SBO (small bowel obstruction) ([place]) ?Complicated bowel history including multiple abdominal surgeries-see HPI above.  Presenting with vomiting, right sided and lower abdominal cramps, chronic diarrhea unchanged.  ?--complicated past GI history including partial colectomy due to redundant colon with primary anastomosis,  perforated bowel r/t gynecologic procedure complicated by enterocutaneous fistula that required multiple surgeries and multiple SBOs with recent admission for partial SBO 10/25/21, admitted again in Nov due to ongoing nausea, vomiting, diarrhea and abdominal pain with suspected pseudo-obstruction, possibly gastroparesis, and small bowel dysmotility  ?- CT abdomen and pelvis with contrast shows numerous postsurgical changes to bowel including adhesive disease along the anterior abdominal wall, dilated small bowel  loops without definite transition point worrisome for small bowel obstruction, air-fluid level in colon in setting of diarrhea. ?--Dr. Arnoldo Morale consulted and recommended advancing diet as  tolerated.  Pt having multiple bowel movements.  ?--continue nonoperative management ?-pt is having BMs last 3 days prior to d/c and tolerating soft diet ? ?Syncope ?In the setting of vomiting, chronic diarrhea, SBO, and dehydration.  Patient reports blood pressure down to 70s and 80s at home. Blood pressure soft In ED.  Similar hospitalization 11/2021 with vomiting, dehydration and syncope. ?-  CTA negative for PE.  EKG, troponins unremarkable. ?-Last echo 10/2021-EF 60 to 65%, normal LV diastolic parameters, no significant valvular abnormalities. ?-Pt has been ambulating halls.  ? ?Opioid dependence ([place]) ?PDMP reviewed ?-pt receives dilaudid 4 mg, #90 monthly ? ?Myalgia, lower leg ?Check CK--17 ?Venous duplex--neg for DVT ? ?Generalized intestinal dysmotility ?--pt s/p pylorus botox treatment with Dr. Jenetta Downer on 4/4.   ?--full liquid diet today per Dr. Jenetta Downer and advance as tolerated. ?--advanced diet to soft diet 4/5 which pt tolerated ? ?Hypokalemia ?Potassium repleted.  ?-Low magnesium - IV replacement ordered and repleted.  ? ?Chronic diarrhea ?--working closely with GI, Dr. Jenetta Downer with GI  ?--overall improved>>soft stool ?-having BMs for 3 days prior to d/c ? ?History of pulmonary embolus (PE) ?History of PE and DVT on Xarelto.  CTA chest without PE.   ?-Hold Xarelto temporarily for planned GI procedure on 4/4.  ?-Xarelto restarted 03/31/22 pm ? ?History of DVT (deep vein thrombosis) ?--rivaroxaban temporarily on hold initially due to anticipation of procedures ?-rivaroxaban restarted without complications ? ?Essential hypertension ?Blood pressure soft. ?-temporarily holding metoprolol with soft blood pressures. ?-will not restart metoprolol ? ?Anxiety state ?Resume home Cymbalta and alprazolam ?-PDMP reviewed ?-pt receives alprazolam '1mg'$ , #60 monthly ? ? ? ? ? ? ? ?Consultants: GI, general surgery ?Procedures performed: EGD with botox ?Disposition: Home ?Diet recommendation: soft ?DISCHARGE  MEDICATION: ?Allergies as of 04/02/2022   ? ?   Reactions  ? Nitrofurantoin Hives  ? Crestor [rosuvastatin Calcium] Other (See Comments)  ? Generalized cramps  ? Bisacodyl Other (See Comments)  ? Makes patient feel like she is having cramps  ? Clarithromycin Hives, Other (See Comments)  ? Cluster migraines  ? Clindamycin Hcl Hives  ? Codeine Itching  ? Iron Sucrose Other (See Comments)  ? Flushing- required benadryl and solu medrol  ? Monosodium Glutamate Other (See Comments)  ? Cluster migraines  ? Scopolamine Hbr Other (See Comments)  ? Cluster migraines, impaired vision  ? ?  ? ?  ?Medication List  ?  ? ?STOP taking these medications   ? ?butalbital-acetaminophen-caffeine 50-325-40 MG tablet ?Commonly known as: FIORICET ?  ?meclizine 25 MG tablet ?Commonly known as: ANTIVERT ?  ?metoprolol tartrate 25 MG tablet ?Commonly known as: LOPRESSOR ?  ? ?  ? ?TAKE these medications   ? ?ALPRAZolam 1 MG tablet ?Commonly known as: [name] ?Take 1 tablet (1 mg total) by mouth 2 (two) times daily. ?  ?aspirin 81 MG chewable tablet ?Chew 81 mg by mouth daily. ?  ?BIOTIN PO ?Take 1 capsule by mouth daily. ?  ?conjugated estrogens 0.625 MG/GM vaginal cream ?Commonly known as: PREMARIN ?Place fingertip amount intravaginally 2 to 3 times per week ?What changed:  ?how much to take ?how to take this ?when to take this ?  ?cyanocobalamin 1000 MCG/ML injection ?Commonly known as: (VITAMIN B-12) ?Inject 1 mL (1,000 mcg total) into the muscle every 30 (thirty) days. ?  ?cyclobenzaprine 10 MG  tablet ?Commonly known as: FLEXERIL ?TAKE 1 TABLET BY MOUTH TWICE DAILY AS NEEDED FOR MUSCLE SPASMS ?What changed: when to take this ?  ?cycloSPORINE 0.05 % ophthalmic emulsion ?Commonly known as: RESTASIS ?Place 1 drop into both eyes 2 (two) times daily as needed (dry eyes). ?  ?DULoxetine 60 MG capsule ?Commonly known as: CYMBALTA ?Take 60 mg by mouth daily. ?  ?Erythromycin 250 MG Tbec ?Take 250 mg by mouth 3 (three) times daily before meals. ?   ?ezetimibe 10 MG tablet ?Commonly known as: Zetia ?Take 1 tablet (10 mg total) by mouth daily. ?What changed: when to take this ?  ?fluticasone 50 MCG/ACT nasal spray ?Commonly known as: FLONASE ?Place 2 sprays into both nostril

## 2022-04-02 NOTE — Telephone Encounter (Signed)
Patient seen in the hospital after syncope and small bowel obstruction. She underwent EGD with Dr. Jenetta Downer on 4/4. Can you set her up for follow up in an few weeks.  ? ?Venetia Night, MSN, FNP-BC, AGACNP-BC ?[facility] ? ?

## 2022-04-02 NOTE — Progress Notes (Signed)
Discharge instructions given patient verbalized understanding. Patients ride is on the way. Advised to call to desk when ready. IV removed. Call light within reach.  ?

## 2022-04-02 NOTE — Telephone Encounter (Signed)
I called the patient but she did not pick up the phone.  I left a detailed voice message explaining results of pathology.  Also explained to her that her leg pain was out of the scope of the gastroenterology field and she will follow with her primary care regarding this.  Fortunately, her inpatient ultrasound did not show any abnormalities in her lower extremities. ?

## 2022-04-03 LAB — ALDOLASE: Aldolase: 3.8 U/L (ref 3.3–10.3)

## 2022-04-05 ENCOUNTER — Other Ambulatory Visit (INDEPENDENT_AMBULATORY_CARE_PROVIDER_SITE_OTHER): Payer: Self-pay | Admitting: Gastroenterology

## 2022-04-06 NOTE — Telephone Encounter (Signed)
Last seen 12/30/2021 by Vikki Ports. ?

## 2022-04-07 ENCOUNTER — Telehealth (INDEPENDENT_AMBULATORY_CARE_PROVIDER_SITE_OTHER): Payer: Self-pay

## 2022-04-07 ENCOUNTER — Other Ambulatory Visit (INDEPENDENT_AMBULATORY_CARE_PROVIDER_SITE_OTHER): Payer: Self-pay | Admitting: Gastroenterology

## 2022-04-07 NOTE — Telephone Encounter (Signed)
Patient called stating you were wanting to know the name of a doctor to refer her to. She states she wants to be referred to Dr. Scherrie November @ [facility]. She has seen him in the past. Please advise. ? (Tried calling for more information no answer). ?

## 2022-04-07 NOTE — Telephone Encounter (Signed)
Last visit in January copied note below: ?GERD is well controlled on Omeprazole '40mg'$  BID and sucralfate 1g QID. We will continue with current regimen.  ?

## 2022-04-07 NOTE — Telephone Encounter (Signed)
Thanks

## 2022-04-07 NOTE — Telephone Encounter (Signed)
Hi Ann, ?Please refer there to [facility] - Dr. Shelbie Proctor. [place]. She wants a surgical opinion about her dysmotility and complex surgical history, she used to be a patient of his. ?Thanks ?

## 2022-04-07 NOTE — Telephone Encounter (Signed)
I called and left a detailed message that the referral had been made and she should here from Dr. Dossie Der office to set up a consult, if not she may call the office back to follow up.  ?

## 2022-04-10 DIAGNOSIS — Z79891 Long term (current) use of opiate analgesic: Secondary | ICD-10-CM | POA: Diagnosis not present

## 2022-04-10 DIAGNOSIS — R1084 Generalized abdominal pain: Secondary | ICD-10-CM | POA: Diagnosis not present

## 2022-04-10 DIAGNOSIS — G43101 Migraine with aura, not intractable, with status migrainosus: Secondary | ICD-10-CM | POA: Diagnosis not present

## 2022-04-10 DIAGNOSIS — M545 Low back pain, unspecified: Secondary | ICD-10-CM | POA: Diagnosis not present

## 2022-04-20 ENCOUNTER — Other Ambulatory Visit: Payer: Self-pay | Admitting: Family Medicine

## 2022-04-23 ENCOUNTER — Encounter (INDEPENDENT_AMBULATORY_CARE_PROVIDER_SITE_OTHER): Payer: Self-pay | Admitting: Gastroenterology

## 2022-04-23 ENCOUNTER — Encounter (INDEPENDENT_AMBULATORY_CARE_PROVIDER_SITE_OTHER): Payer: Self-pay

## 2022-04-23 ENCOUNTER — Ambulatory Visit (INDEPENDENT_AMBULATORY_CARE_PROVIDER_SITE_OTHER): Payer: Medicare Other | Admitting: Gastroenterology

## 2022-04-29 ENCOUNTER — Encounter: Payer: Self-pay | Admitting: Family Medicine

## 2022-04-29 ENCOUNTER — Telehealth: Payer: Self-pay

## 2022-04-29 ENCOUNTER — Ambulatory Visit: Payer: Medicare Other | Admitting: Family Medicine

## 2022-04-29 ENCOUNTER — Other Ambulatory Visit: Payer: Self-pay | Admitting: Family Medicine

## 2022-04-29 ENCOUNTER — Inpatient Hospital Stay (HOSPITAL_COMMUNITY): Payer: Medicare Other | Attending: Physician Assistant

## 2022-04-29 DIAGNOSIS — D508 Other iron deficiency anemias: Secondary | ICD-10-CM | POA: Insufficient documentation

## 2022-04-29 DIAGNOSIS — K912 Postsurgical malabsorption, not elsewhere classified: Secondary | ICD-10-CM | POA: Insufficient documentation

## 2022-04-29 DIAGNOSIS — Z7901 Long term (current) use of anticoagulants: Secondary | ICD-10-CM | POA: Diagnosis not present

## 2022-04-29 DIAGNOSIS — Z86711 Personal history of pulmonary embolism: Secondary | ICD-10-CM | POA: Insufficient documentation

## 2022-04-29 DIAGNOSIS — Z79899 Other long term (current) drug therapy: Secondary | ICD-10-CM | POA: Insufficient documentation

## 2022-04-29 DIAGNOSIS — D509 Iron deficiency anemia, unspecified: Secondary | ICD-10-CM

## 2022-04-29 DIAGNOSIS — G2581 Restless legs syndrome: Secondary | ICD-10-CM | POA: Diagnosis not present

## 2022-04-29 DIAGNOSIS — E538 Deficiency of other specified B group vitamins: Secondary | ICD-10-CM | POA: Diagnosis not present

## 2022-04-29 DIAGNOSIS — Z8719 Personal history of other diseases of the digestive system: Secondary | ICD-10-CM | POA: Diagnosis not present

## 2022-04-29 DIAGNOSIS — E559 Vitamin D deficiency, unspecified: Secondary | ICD-10-CM | POA: Insufficient documentation

## 2022-04-29 LAB — CBC WITH DIFFERENTIAL/PLATELET
Abs Immature Granulocytes: 0.01 10*3/uL (ref 0.00–0.07)
Basophils Absolute: 0 10*3/uL (ref 0.0–0.1)
Basophils Relative: 1 %
Eosinophils Absolute: 0.1 10*3/uL (ref 0.0–0.5)
Eosinophils Relative: 1 %
HCT: 42.4 % (ref 36.0–46.0)
Hemoglobin: 14 g/dL (ref 12.0–15.0)
Immature Granulocytes: 0 %
Lymphocytes Relative: 24 %
Lymphs Abs: 1.4 10*3/uL (ref 0.7–4.0)
MCH: 29.7 pg (ref 26.0–34.0)
MCHC: 33 g/dL (ref 30.0–36.0)
MCV: 90 fL (ref 80.0–100.0)
Monocytes Absolute: 0.3 10*3/uL (ref 0.1–1.0)
Monocytes Relative: 6 %
Neutro Abs: 3.9 10*3/uL (ref 1.7–7.7)
Neutrophils Relative %: 68 %
Platelets: 207 10*3/uL (ref 150–400)
RBC: 4.71 MIL/uL (ref 3.87–5.11)
RDW: 14.1 % (ref 11.5–15.5)
WBC: 5.8 10*3/uL (ref 4.0–10.5)
nRBC: 0 % (ref 0.0–0.2)

## 2022-04-29 LAB — FOLATE: Folate: 24.6 ng/mL (ref >5.9)

## 2022-04-29 LAB — VITAMIN B12: Vitamin B-12: 731 pg/mL (ref 180–914)

## 2022-04-29 LAB — IRON AND TIBC
Iron: 96 ug/dL (ref 28–170)
Saturation Ratios: 34 % — ABNORMAL HIGH (ref 10.4–31.8)
TIBC: 283 ug/dL (ref 250–450)
UIBC: 187 ug/dL

## 2022-04-29 LAB — VITAMIN D 25 HYDROXY (VIT D DEFICIENCY, FRACTURES): Vit D, 25-Hydroxy: 131.72 ng/mL — ABNORMAL HIGH (ref 30–100)

## 2022-04-29 LAB — FERRITIN: Ferritin: 350 ng/mL — ABNORMAL HIGH (ref 11–307)

## 2022-04-29 MED ORDER — ALPRAZOLAM 1 MG PO TABS: 1.0000 mg | ORAL_TABLET | Freq: Two times a day (BID) | ORAL | 0 refills | Status: DC

## 2022-04-29 NOTE — Telephone Encounter (Signed)
Appt scheduled

## 2022-04-29 NOTE — Telephone Encounter (Signed)
Patient need refill ?ALPRAZolam (XANAX) 1 MG tablet  ? ?[facility] ?

## 2022-04-29 NOTE — Telephone Encounter (Signed)
Request sent to [place] ?

## 2022-05-01 ENCOUNTER — Other Ambulatory Visit (HOSPITAL_COMMUNITY): Payer: Medicare Other

## 2022-05-01 ENCOUNTER — Inpatient Hospital Stay (HOSPITAL_COMMUNITY): Payer: Medicare Other

## 2022-05-01 ENCOUNTER — Other Ambulatory Visit: Payer: Self-pay | Admitting: Family Medicine

## 2022-05-01 DIAGNOSIS — Z1231 Encounter for screening mammogram for malignant neoplasm of breast: Secondary | ICD-10-CM

## 2022-05-02 LAB — METHYLMALONIC ACID, SERUM: Methylmalonic Acid, Quantitative: 112 nmol/L (ref 0–378)

## 2022-05-04 ENCOUNTER — Encounter: Payer: Self-pay | Admitting: Family Medicine

## 2022-05-04 ENCOUNTER — Telehealth: Payer: Self-pay | Admitting: Family Medicine

## 2022-05-04 NOTE — Telephone Encounter (Signed)
PATIENT WANTS STANDING ORDER FOR LABS , IV FLUID POTASSIUM MAGNESIUM THIS WILL ELIMINATED HAVING TO GO TO ER FOR THIS.  ? ?WALKING WHOLE FACE GOES NUMB BODY JERKS, WHEN HAPPENS PATIENT IS DEPLETED OF MAGNESIUM POTASSIUM AND DEHYDRATED. ? ?PATIENT WANTS A CALL BACK IN REGARD.  ?

## 2022-05-05 NOTE — Telephone Encounter (Signed)
Please advise - I know you said you do not order IV potassium ?

## 2022-05-05 NOTE — Telephone Encounter (Signed)
LMTRC

## 2022-05-07 ENCOUNTER — Other Ambulatory Visit: Payer: Self-pay

## 2022-05-07 ENCOUNTER — Other Ambulatory Visit: Payer: Self-pay | Admitting: Family Medicine

## 2022-05-07 DIAGNOSIS — Z1231 Encounter for screening mammogram for malignant neoplasm of breast: Secondary | ICD-10-CM

## 2022-05-07 DIAGNOSIS — M62838 Other muscle spasm: Secondary | ICD-10-CM

## 2022-05-07 MED ORDER — PREMARIN 0.625 MG/GM VA CREA: 1.0000 "application " | TOPICAL_CREAM | Freq: Every day | VAGINAL | 0 refills | Status: DC

## 2022-05-07 NOTE — Progress Notes (Signed)
? ?Virtual Visit via Telephone Note ?[place] ? ?I connected with [name]  on 05/08/2022 at 2:17 PM by telephone and verified that I am speaking with the correct person using two identifiers. ? ?Location: ?Patient: Home ?Provider: [place] ?  ?I discussed the limitations, risks, security and privacy concerns of performing an evaluation and management service by telephone and the availability of in person appointments. I also discussed with the patient that there may be a patient responsible charge related to this service. The patient expressed understanding and agreed to proceed. ? ?REASON FOR VISIT:  ?Follow-up for iron deficiency anemia ?  ?CURRENT THERAPY: IV iron (last INFED on 01/08/2022) ? ?INTERVAL HISTORY: ?[name] (61 y.o. female) is contacted today for follow-up of her iron deficiency anemia.  She was last evaluated via telemedicine visit by Tarri Abernethy PA-C on 01/02/2022.  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  She also has history of Paget's disease of the left breast which was resected with mastectomy in 2006.  She also had unprovoked pulmonary embolism in 1997 and is on Xarelto.   ? ?[name] has multiple medical complaints at today's visit related to her high burden of chronic disease and multiple medical issues, particularly her gastroparesis and ongoing complications from her previous abdominal surgeries.  ? ?She continues to have chronic fatigue, which has been somewhat worsened lately.  She currently reports that she is "completely wiped out."   She reports stable restless legs, headaches, dyspnea on exertion.  She continues to have episodes of lightheadedness and presyncope in conjunction with the room spinning and her arms and legs feeling numb.  She was hospitalized for syncope in December 2022.  She has previously had issues with chest pain for the past  several years, which she reports has had negative cardiac work-up. ?  ?She continues to take Xarelto due to her history of unprovoked pulmonary embolisms.  She is tolerating it well without major bleeding events.  She denies any epistaxis, hematemesis, hematochezia, or melena.  She has not had any new unilateral leg swelling, pain, or erythema. ?  ?She denies any new breast lumps or lymphadenopathy.  She does continue to complain about right nipple itching, which is being worked up by her primary care provider with diagnostic mammogram planned for 06/03/2022. ? ?She has had significant weight loss over the past several months, which she believes is from her gastroparesis.  She also reports intermittent low-grade fever (temperature around 100 ?F) since October 2022. ? ?  ?OBSERVATIONS/OBJECTIVE: ?Review of Systems  ?Constitutional:  Positive for fever, malaise/fatigue and weight loss. Negative for chills and diaphoresis.  ?Respiratory:  Positive for cough and shortness of breath.   ?Cardiovascular:  Positive for chest pain. Negative for palpitations.  ?Gastrointestinal:  Positive for abdominal pain, constipation, diarrhea, nausea and vomiting. Negative for blood in stool and melena.  ?Genitourinary:  Positive for dysuria.  ?Neurological:  Positive for dizziness, tingling and headaches.  ?Psychiatric/Behavioral:  Positive for depression. The patient is nervous/anxious and has insomnia.    ? ?PHYSICAL EXAM (per limitations of virtual telephone visit): The patient is alert and oriented x 3, exhibiting adequate mentation, good mood, and ability to speak in full sentences and execute sound judgement. ? ? ?ASSESSMENT & PLAN: ?1.  Iron deficiency anemia: ?- History of iron deficiency status secondary to multiple bowel resections and malabsorption. ?- Previous work-up (02/13/2021) showed normal LDH.  Folic acid and W46, copper and methylmalonic acid were normal.  SPEP was negative.  CA 15-3 and TSH were also normal. ?- Most  recent EGD (05/02/2021) showed single gastric polyp, otherwise normal ?- Most recent colonoscopy (05/02/2021) was limited by liquid stool in the colon, showed nonbleeding internal hemorrhoids ?- Patient was given IV Venofer on 02/18/2021 but had a reaction to Venofer.  Most recently received received INFeD infusion on 01/08/2022, which she tolerated after premedication was given. ?- No bright red blood per rectum or melena   ?- Most recent labs (04/29/2022): Hgb 14.0/MCV 90.0, ferritin 350, iron saturation 34%. ?- Nutritional panel (04/29/2022): Normal B12, folate, methylmalonic acid.  Previously had normal copper. ?- PLAN: No indication for IV iron at this time. ?- Repeat labs and office visit in 4 months. ?  ?2.  Unprovoked pulmonary embolism: ?- Most recent D-dimer is negative.  No current signs or symptoms of DVT/PE. ?- Patient is worried that she is not digesting her medications, since she is having issues with GI motility, gastroparesis, and frequent vomiting. ?- We discussed that she could change from Xarelto to Lovenox, but she prefers to hold off on this for the time being.  She will discuss with her other specialists and reach out to our clinic if she decides to change the formulation of her anticoagulant. ?- PLAN: Continue Xarelto.  Would consider switching to Lovenox in the future if there is persistent concern for GI absorption of medications in the setting of GI motility issues and gastroparesis. ? ?3.  History of Paget's disease of left breast ?- History of Paget's disease of the left breast which was resected with mastectomy in 2006. ?- Most recent mammogram 06/02/2021: BI-RADS Category 1 negative ?- She reports itching in her right nipple and is scheduled for upcoming diagnostic mammogram on 06/03/2022 as ordered by her PCP ?- Patient reports that she was also told that her right breast implant has been recalled and she hopes to get this removed in the future. ?- PLAN: Continue annual mammograms and breast exams  via PCP and breast center.  If any findings concerning for malignancy are discovered, she will need to be referred back to [place] as a new patient for evaluation by Dr. Delton Coombes. ?  ?4.  B12 deficiency secondary to intestinal malabsorption ?- She has been self-administering B12 injections ?  ?5.  Vitamin D deficiency ?- She has been taking vitamin D 50,000 units every other week ?- Most recent vitamin D (04/29/2022) shows vitamin D overload at 131.72 ?- PLAN: Recommend holding her vitamin D supplement and rechecking at follow-up visit in 4 months. ? ? ?FOLLOW UP INSTRUCTIONS: ?Labs in 4 months ?Phone visit after labs ? ?  ?I discussed the assessment and treatment plan with the patient. The patient was provided an opportunity to ask questions and all were answered. The patient agreed with the plan and demonstrated an understanding of the instructions. ?  ?The patient was advised to call back or seek an in-person evaluation if the symptoms worsen or if the condition fails to improve as anticipated. ? ?I provided 23 minutes of non-face-to-face time during this encounter. ? ? ?Harriett Rush, PA-C ?05/08/2022 2:40 PM ?

## 2022-05-07 NOTE — Telephone Encounter (Signed)
Patient aware.

## 2022-05-08 ENCOUNTER — Inpatient Hospital Stay (HOSPITAL_BASED_OUTPATIENT_CLINIC_OR_DEPARTMENT_OTHER): Payer: Medicare Other | Admitting: Physician Assistant

## 2022-05-08 DIAGNOSIS — E559 Vitamin D deficiency, unspecified: Secondary | ICD-10-CM

## 2022-05-08 DIAGNOSIS — D509 Iron deficiency anemia, unspecified: Secondary | ICD-10-CM | POA: Diagnosis not present

## 2022-05-08 DIAGNOSIS — K912 Postsurgical malabsorption, not elsewhere classified: Secondary | ICD-10-CM | POA: Diagnosis not present

## 2022-05-08 DIAGNOSIS — E538 Deficiency of other specified B group vitamins: Secondary | ICD-10-CM

## 2022-05-12 ENCOUNTER — Ambulatory Visit (INDEPENDENT_AMBULATORY_CARE_PROVIDER_SITE_OTHER): Payer: Medicare Other | Admitting: Family Medicine

## 2022-05-12 ENCOUNTER — Emergency Department (HOSPITAL_COMMUNITY)
Admission: EM | Admit: 2022-05-12 | Discharge: 2022-05-13 | Disposition: A | Discharge status: Home / Self Care | Payer: Medicare Other | Attending: Emergency Medicine | Admitting: Emergency Medicine

## 2022-05-12 ENCOUNTER — Encounter: Payer: Self-pay | Admitting: Family Medicine

## 2022-05-12 ENCOUNTER — Other Ambulatory Visit: Payer: Self-pay

## 2022-05-12 ENCOUNTER — Encounter (HOSPITAL_COMMUNITY): Payer: Self-pay

## 2022-05-12 VITALS — Resp 16 | Ht 64.5 in | Wt 171.1 lb

## 2022-05-12 DIAGNOSIS — E039 Hypothyroidism, unspecified: Secondary | ICD-10-CM | POA: Insufficient documentation

## 2022-05-12 DIAGNOSIS — E860 Dehydration: Secondary | ICD-10-CM | POA: Diagnosis not present

## 2022-05-12 DIAGNOSIS — Z7901 Long term (current) use of anticoagulants: Secondary | ICD-10-CM | POA: Insufficient documentation

## 2022-05-12 DIAGNOSIS — N3090 Cystitis, unspecified without hematuria: Secondary | ICD-10-CM

## 2022-05-12 DIAGNOSIS — Z79899 Other long term (current) drug therapy: Secondary | ICD-10-CM | POA: Diagnosis not present

## 2022-05-12 DIAGNOSIS — Z7982 Long term (current) use of aspirin: Secondary | ICD-10-CM | POA: Insufficient documentation

## 2022-05-12 DIAGNOSIS — I959 Hypotension, unspecified: Secondary | ICD-10-CM

## 2022-05-12 DIAGNOSIS — R42 Dizziness and giddiness: Secondary | ICD-10-CM

## 2022-05-12 DIAGNOSIS — I10 Essential (primary) hypertension: Secondary | ICD-10-CM | POA: Insufficient documentation

## 2022-05-12 DIAGNOSIS — E876 Hypokalemia: Secondary | ICD-10-CM | POA: Diagnosis not present

## 2022-05-12 DIAGNOSIS — E785 Hyperlipidemia, unspecified: Secondary | ICD-10-CM

## 2022-05-12 LAB — COMPREHENSIVE METABOLIC PANEL
ALT: 41 U/L (ref 0–44)
AST: 27 U/L (ref 15–41)
Albumin: 3.5 g/dL (ref 3.5–5.0)
Alkaline Phosphatase: 131 U/L — ABNORMAL HIGH (ref 38–126)
Anion gap: 4 — ABNORMAL LOW (ref 5–15)
BUN: 12 mg/dL (ref 6–20)
CO2: 27 mmol/L (ref 22–32)
Calcium: 8.8 mg/dL — ABNORMAL LOW (ref 8.9–10.3)
Chloride: 107 mmol/L (ref 98–111)
Creatinine, Ser: 0.75 mg/dL (ref 0.44–1.00)
GFR, Estimated: >60 mL/min (ref >60)
Glucose, Bld: 95 mg/dL (ref 70–99)
Potassium: 4.1 mmol/L (ref 3.5–5.1)
Sodium: 138 mmol/L (ref 135–145)
Total Bilirubin: 0.4 mg/dL (ref 0.3–1.2)
Total Protein: 6.3 g/dL — ABNORMAL LOW (ref 6.5–8.1)

## 2022-05-12 LAB — POCT URINALYSIS DIP (CLINITEK)
Glucose, UA: 100 mg/dL — AB
Leukocytes, UA: NEGATIVE
Nitrite, UA: POSITIVE — AB
POC PROTEIN,UA: 100 — AB
Spec Grav, UA: 1.025 (ref 1.010–1.025)
Urobilinogen, UA: 2 E.U./dL — AB
pH, UA: 5.5 (ref 5.0–8.0)

## 2022-05-12 LAB — LIPASE, BLOOD: Lipase: 35 U/L (ref 11–51)

## 2022-05-12 LAB — CBC
HCT: 43.5 % (ref 36.0–46.0)
Hemoglobin: 13.5 g/dL (ref 12.0–15.0)
MCH: 28.6 pg (ref 26.0–34.0)
MCHC: 31 g/dL (ref 30.0–36.0)
MCV: 92.2 fL (ref 80.0–100.0)
Platelets: 261 10*3/uL (ref 150–400)
RBC: 4.72 MIL/uL (ref 3.87–5.11)
RDW: 14.2 % (ref 11.5–15.5)
WBC: 6.9 10*3/uL (ref 4.0–10.5)
nRBC: 0 % (ref 0.0–0.2)

## 2022-05-12 LAB — URINALYSIS, ROUTINE W REFLEX MICROSCOPIC
Bilirubin Urine: NEGATIVE
Glucose, UA: NEGATIVE mg/dL
Hgb urine dipstick: NEGATIVE
Ketones, ur: NEGATIVE mg/dL
Leukocytes,Ua: NEGATIVE
Nitrite: NEGATIVE
Protein, ur: NEGATIVE mg/dL
Specific Gravity, Urine: 1.002 — ABNORMAL LOW (ref 1.005–1.030)
pH: 7 (ref 5.0–8.0)

## 2022-05-12 LAB — MAGNESIUM: Magnesium: 1.6 mg/dL — ABNORMAL LOW (ref 1.7–2.4)

## 2022-05-12 MED ORDER — HYDROMORPHONE HCL 2 MG PO TABS
4.0000 mg | ORAL_TABLET | Freq: Once | ORAL | Status: AC
  Administered 2022-05-12: 4 mg via ORAL
  Filled 2022-05-12: qty 2

## 2022-05-12 MED ORDER — MAGNESIUM SULFATE 2 GM/50ML IV SOLN
2.0000 g | Freq: Once | INTRAVENOUS | Status: AC
  Administered 2022-05-12: 2 g via INTRAVENOUS
  Filled 2022-05-12: qty 50

## 2022-05-12 MED ORDER — LACTATED RINGERS IV BOLUS
500.0000 mL | Freq: Once | INTRAVENOUS | Status: AC
Start: 2022-05-12 — End: 2022-05-13
  Administered 2022-05-12: 500 mL via INTRAVENOUS

## 2022-05-12 MED ORDER — LACTATED RINGERS IV BOLUS
1000.0000 mL | Freq: Once | INTRAVENOUS | Status: AC
Start: 2022-05-12 — End: 2022-05-12
  Administered 2022-05-12: 1000 mL via INTRAVENOUS

## 2022-05-12 NOTE — Assessment & Plan Note (Signed)
Symptomatic 5 days ago , took pyridium and septra, will check c/s ?

## 2022-05-12 NOTE — ED Provider Notes (Signed)
[facility] EMERGENCY DEPARTMENT Provider Note   CSN: [id number] Arrival date & time: 05/12/22  1543     History  Chief Complaint  Patient presents with   Abdominal Pain   Weakness   Hypotension    [name] is a 61 y.o. female.   Abdominal Pain Associated symptoms: nausea (Chronic)   Weakness Associated symptoms: abdominal pain (Chronic), dizziness and nausea (Chronic)   Patient presents for concern of dehydration.  She has chronic gastroparesis.  Because of this, she has poor p.o. intake at baseline.  She is able to drink water.  She was seen by her primary care doctor earlier today.  While at her doctor's office, she had orthostatic hypotension with near syncopal symptoms with standing.  She presents to the ED for IV fluids and electrolytes.  She is followed by [facility] gastroenterology.  She has undergone Botox injections in her stomach.  She is on a azithromycin 3 times daily.  She has been told not to take Reglan because it will cause parkinsonism's.  Currently, she endorses her baseline chronic abdominal discomfort.  She does not have near syncopal symptoms at rest.    Home Medications Prior to Admission medications   Medication Sig Start Date End Date Taking? Authorizing Provider  ALPRAZolam [name]) 1 MG tablet Take 1 tablet (1 mg total) by mouth 2 (two) times daily. 04/29/22  Yes Fayrene Helper, MD  aspirin 81 MG chewable tablet Chew 81 mg by mouth daily.   Yes [provider]  BIOTIN PO Take 1 capsule by mouth daily.   Yes [provider]  butalbital-acetaminophen-caffeine (FIORICET) 50-325-40 MG tablet Take 1 tablet by mouth every 6 (six) hours as needed. 04/11/22  Yes [provider]  conjugated estrogens (PREMARIN) vaginal cream Place 1 Applicatorful (1 application. total) vaginally daily. 05/07/22  Yes Fayrene Helper, MD  cyanocobalamin (,VITAMIN B-12,) 1000 MCG/ML injection Inject 1 mL (1,000 mcg total) into the muscle every  30 (thirty) days. 09/18/21  Yes Pennington, Rebekah M, PA-C  cyclobenzaprine (FLEXERIL) 10 MG tablet TAKE 1 TABLET BY MOUTH TWICE DAILY AS NEEDED FOR MUSCLE SPASMS Patient taking differently: Take 10 mg by mouth 2 (two) times daily. 02/21/20  Yes Fayrene Helper, MD  cycloSPORINE (RESTASIS) 0.05 % ophthalmic emulsion Place 1 drop into both eyes 2 (two) times daily as needed (dry eyes).   Yes [provider]  DULoxetine (CYMBALTA) 60 MG capsule Take 60 mg by mouth daily. 02/09/22  Yes [provider]  erythromycin (ERY-TAB) 250 MG EC tablet TAKE 1 TABLET BY MOUTH THREE TIMES DAILY BEFORE MEALS Patient taking differently: 250 mg every evening. 04/06/22  Yes Harvel Quale, MD  ezetimibe (ZETIA) 10 MG tablet Take 1 tablet (10 mg total) by mouth daily. Patient taking differently: Take 10 mg by mouth at bedtime. 09/28/21  Yes Fayrene Helper, MD  fluticasone (FLONASE) 50 MCG/ACT nasal spray Place 2 sprays into both nostrils daily as needed for allergies. 12/05/21  Yes Johnson, Clanford L, MD  HYDROmorphone (DILAUDID) 4 MG tablet Take 4 mg by mouth 3 (three) times daily.   Yes [provider]  levothyroxine (SYNTHROID) 125 MCG tablet TAKE 1 TABLET(125 MCG) BY MOUTH DAILY BEFORE BREAKFAST Patient taking differently: Take 125 mcg by mouth daily before breakfast. 08/26/21  Yes Fayrene Helper, MD  lubiprostone (AMITIZA) 24 MCG capsule Take 1 capsule (24 mcg total) by mouth 2 (two) times daily with a meal. 12/30/21  Yes Carlan, Deatra Robinson, NP  magnesium oxide (MAG-OX) 400 (240 Mg) MG tablet Take 1 tablet (400 mg total) by mouth 2 (two) times daily. Please make overdue appt with Dr. Caryl Comes before anymore refills. Thank you 1st attempt 09/03/21  Yes Deboraha Sprang, MD  metFORMIN (GLUCOPHAGE-XR) 500 MG 24 hr tablet Take 500 mg by mouth every evening. 01/15/22  Yes [provider]  Multiple Vitamins-Minerals (ICAPS AREDS 2 PO) Take 1 capsule by mouth every evening.    Yes [provider]  omeprazole (PRILOSEC) 40 MG capsule Take 1 capsule (40 mg total) by mouth in the morning and at bedtime. 11/18/21  Yes Kathie Dike, MD  phenazopyridine (PYRIDIUM) 95 MG tablet Take 95 mg by mouth 3 (three) times daily as needed for pain.   Yes [provider]  potassium chloride (KLOR-CON M) 10 MEQ tablet TAKE 1 TABLET BY MOUTH EVERY DAY Patient taking differently: Take 10 mEq by mouth daily. 03/17/22  Yes Fayrene Helper, MD  pravastatin (PRAVACHOL) 40 MG tablet TAKE 1 TABLET(40 MG) BY MOUTH DAILY Patient taking differently: Take 40 mg by mouth at bedtime. 01/08/22  Yes Fayrene Helper, MD  sulfamethoxazole-trimethoprim (BACTRIM) 400-80 MG tablet Take 1 tablet by mouth 2 (two) times daily.   Yes [provider]  SUPER B COMPLEX/C PO Take 1 capsule by mouth daily.   Yes [provider]  XARELTO 20 MG TABS tablet TAKE 1 TABLET(20 MG) BY MOUTH DAILY 03/25/22  Yes Fayrene Helper, MD      Allergies    Nitrofurantoin, Crestor [rosuvastatin calcium], Bisacodyl, Clarithromycin, Clindamycin hcl, Codeine, Iron sucrose, Monosodium glutamate, Scopolamine hbr, and Other    Review of Systems   Review of Systems  Gastrointestinal:  Positive for abdominal pain (Chronic) and nausea (Chronic).  Neurological:  Positive for dizziness, weakness (Generalized) and light-headedness.   Physical Exam Updated Vital Signs BP (!) 134/92   Pulse 84   Temp 98.8 F (37.1 C) (Oral)   Resp 17   Ht 5' 4.5" (1.638 m)   Wt 78.2 kg   SpO2 95%   BMI 29.14 kg/m  Physical Exam Vitals and nursing note reviewed.  Constitutional:      General: She is not in acute distress.    Appearance: She is well-developed and normal weight. She is not ill-appearing, toxic-appearing or diaphoretic.  HENT:     Head: Normocephalic and atraumatic.     Mouth/Throat:     Mouth: Mucous membranes are moist.     Pharynx: Oropharynx is clear.  Eyes:      Conjunctiva/sclera: Conjunctivae normal.  Cardiovascular:     Rate and Rhythm: Normal rate and regular rhythm.     Heart sounds: No murmur heard. Pulmonary:     Effort: Pulmonary effort is normal. No respiratory distress.     Breath sounds: Normal breath sounds.  Abdominal:     Palpations: Abdomen is soft.     Tenderness: There is no abdominal tenderness.  Musculoskeletal:        General: No swelling.     Cervical back: Neck supple.  Skin:    General: Skin is warm and dry.     Capillary Refill: Capillary refill takes less than 2 seconds.  Neurological:     General: No focal deficit present.     Mental Status: She is alert and oriented to person, place, and time.  Psychiatric:        Mood and Affect: Mood normal.        Behavior: Behavior normal.  ED Results / Procedures / Treatments   Labs (all labs ordered are listed, but only abnormal results are displayed) Labs Reviewed  COMPREHENSIVE METABOLIC PANEL - Abnormal; Notable for the following components:      Result Value   Calcium 8.8 (*)    Total Protein 6.3 (*)    Alkaline Phosphatase 131 (*)    Anion gap 4 (*)    All other components within normal limits  URINALYSIS, ROUTINE W REFLEX MICROSCOPIC - Abnormal; Notable for the following components:   Specific Gravity, Urine 1.002 (*)    All other components within normal limits  MAGNESIUM - Abnormal; Notable for the following components:   Magnesium 1.6 (*)    All other components within normal limits  LIPASE, BLOOD  CBC    EKG None  Radiology No results found.  Procedures Procedures    Medications Ordered in ED Medications  lactated ringers bolus 1,000 mL (0 mLs Intravenous Stopped 05/12/22 2355)  magnesium sulfate IVPB 2 g 50 mL (0 g Intravenous Stopped 05/12/22 2355)  lactated ringers bolus 500 mL (0 mLs Intravenous Stopped 05/13/22 0021)  HYDROmorphone (DILAUDID) tablet 4 mg (4 mg Oral Given 05/12/22 2325)    ED Course/ Medical Decision Making/ A&P                            Medical Decision Making Amount and/or Complexity of Data Reviewed Labs: ordered.  Risk Prescription drug management.   This patient presents to the ED for concern of dehydration, this involves an extensive number of treatment options, and is a complaint that carries with it a high risk of complications and morbidity.  The differential diagnosis includes dehydration, malnutrition, electrolyte abnormalities   Co morbidities that complicate the patient evaluation  HTN, migraine, GERD, malabsorption, gastroparesis, intestinal dysmotility, hypothyroidism, HLD, anxiety, anemia, fibromyalgia,   Additional history obtained:  Additional history obtained from patient's husband External records from outside source obtained and reviewed including EMR   Lab Tests:  I Ordered, and personally interpreted labs.  The pertinent results include: Hypomagnesemia, otherwise normal electrolytes, normal hemoglobin, no leukocytosis, no evidence of UTI  Problem List / ED Course / Critical interventions / Medication management  61 year old female presenting from PCPs office due to concern of dehydration.  She endorses recent lightheaded and near syncopal symptoms.  She had orthostatic hypotension at her PCPs office.  She has had very poor p.o. intake.  She has chronic gastroparesis and short gut syndrome.  She is followed by [facility] for her GI issues.  She believes that she has become dehydrated due to her acute on chronic poor p.o. intake.  On arrival in the ED, she has normal vital signs, including normal blood pressure.  She is well-appearing on exam.  She endorses some mild abdominal pain which is baseline for her.  I had a shared decision-making discussion with her regarding work-up and management.  Patient simply request lab work-up and IV fluids at this time.  IV fluid bolus was ordered.  Patient's lab work is notable for some mild hypomagnesemia.  IV replacement was ordered.  On  reassessment, she reports improved symptoms.  She was able to ambulate without any further near-syncopal symptoms.  She was given her home dose of Dilaudid for her chronic abdominal pain.  She was advised to continue to follow-up with her outpatient providers.  She was discharged in good condition. I ordered medication including IV fluids for dehydration; magnesium sulfate for hypomagnesemia  Reevaluation of the patient after these medicines showed that the patient resolved I have reviewed the patients home medicines and have made adjustments as needed   Social Determinants of Health:  Has access to outpatient care        Final Clinical Impression(s) / ED Diagnoses Final diagnoses:  Dehydration    Rx / DC Orders ED Discharge Orders     None         Godfrey Pick, MD 05/15/22 1350

## 2022-05-12 NOTE — Assessment & Plan Note (Signed)
Stat chem 7 and EGFr, Calcium and magnesium ?

## 2022-05-12 NOTE — ED Triage Notes (Addendum)
Patient states she was sent here by Dr. Moshe Cipro and states she has periods of orthostatic hypotension with syncope. 80s/40s per patient. Patient being treated for gastroparesis and states lately she has been having cramping and jerking in legs.  ?

## 2022-05-12 NOTE — Patient Instructions (Addendum)
F/U in 3 months, call if you need me sooner ? ?Urine to be checked for ketones, if positive , then you will be sent to the ED ( took septra for 3 days last Friday) ? ?Urine for c/S based on recent UTI symptoms ? ?Blood work stat for chem 7 and eGFr, magnesium and calcium,non stat hepatic , and TSH deferredas pt had to go to the ED ? ?You will be referred  to Dr Jens Som / Cardiology re symptomatic hypotension ? ?I encourage  you to keep trying to nourish yourself ? ?Be careful not to fall ? ?Thanks for choosing [facility], we consider it a privelige to serve you. ? ?

## 2022-05-12 NOTE — Progress Notes (Signed)
? ?  [name]     MRN: [id number]      DOB: 1961-02-26 ? ? ?HPI ?[name] is here for follow up and re-evaluation of chronic medical conditions, medication management and review of any available recent lab and radiology data.  ?regarding consultations or procedures which the PT has had in the interim are  addressed. ?Unable to eat due to spasm of stromach sphincter  since October, botox injection attempted unsuccessful , has appt at [facility] next week for eval ?Recurrent light headedness , unable to eat or drink much, due to spasm of spincter. Recurrent light headedness and near syncope. ?C/o involuntary jerking, concerned about electrolyte status ?Started septra last week Friday for urinary symptoms, has taken this for 3 days ?Hurts all over ? ?ROS ?Denies recent fever or chills. ?Denies sinus pressure, nasal congestion, ear pain or sore throat. ?Denies chest congestion, productive cough or wheezing. ?Denies depression, anxiety or insomnia. ?Denies skin break down or rash. ? ? ?PE ? ?Resp 16   Ht 5' 4.5" (1.638 m)   Wt 171 lb 1.9 oz (77.6 kg)   SpO2 93%   BMI 28.92 kg/m?  ?Orthostatic: sitting bP 117/78, 101 ; lying ; 88/68, HR 97, Standing 82/62, HR 109 ?Patient alert and oriented  ?HEENT: No facial asymmetry, EOMI,     Neck supple . ? ?Chest: Clear to auscultation bilaterally. ? ?CVS: S1, S2 no murmurs, no S3.Regular rate. ? ?ABD: Soft .  ? ?Ext: No edema ? ?MS: Adequate ROM spine, shoulders, hips and knees. ? ?Skin: Intact, no ulcerations or rash noted. ? ?Psych: Good eye contact, normal affect. Memory intact not anxious or depressed appearing. ? ?CNS: CN 2-12 intact, power,  normal throughout.no focal deficits noted. ? ? ?Assessment & Plan ? ?Hypokalemia ?Stat chem 7 and EGFr, Calcium and magnesium ? ?Cystitis ?Symptomatic 5 days ago , took pyridium and septra, will check c/s ? ?Dehydration ?Poor oral intake, mere syncopal, light headed and hypotensive CCUA positive for ketones, send to ED for  furhter eval and management  ? ?Light-headedness ?Recurrent light headedness with near syncope, UA positive for dehydration and SBP low and orthostatic. Pt to go to ED for furthere val and management ? ?

## 2022-05-13 ENCOUNTER — Encounter: Payer: Self-pay | Admitting: Family Medicine

## 2022-05-13 DIAGNOSIS — R42 Dizziness and giddiness: Secondary | ICD-10-CM | POA: Insufficient documentation

## 2022-05-13 DIAGNOSIS — I959 Hypotension, unspecified: Secondary | ICD-10-CM | POA: Insufficient documentation

## 2022-05-13 NOTE — Discharge Instructions (Signed)
Follow-up with your outpatient providers as scheduled.  Return to the emergency department at any time if you have any worsening of symptoms. ?

## 2022-05-13 NOTE — Assessment & Plan Note (Signed)
Recurrent light headedness with near syncope, UA positive for dehydration and SBP low and orthostatic. Pt to go to ED for furthere val and management ?

## 2022-05-13 NOTE — Assessment & Plan Note (Signed)
Poor oral intake, mere syncopal, light headed and hypotensive CCUA positive for ketones, send to ED for furhter eval and management  ?

## 2022-05-15 ENCOUNTER — Encounter (INDEPENDENT_AMBULATORY_CARE_PROVIDER_SITE_OTHER): Payer: Self-pay

## 2022-05-17 ENCOUNTER — Other Ambulatory Visit: Payer: Self-pay | Admitting: Family Medicine

## 2022-05-17 NOTE — Progress Notes (Deleted)
Cardiology Office Note:    Date:  05/17/2022   ID:  [name], DOB Jul 30, 1961, MRN [id number]  PCP:  Fayrene Helper, MD   [facility] Providers Cardiologist:  Werner Lean, MD     Referring MD: Fayrene Helper, MD   CC: Follow up CT  History of Present Illness:    [name] is a 61 y.o. female with a hx of HTN, IDA, Fibromyalgia IVC Filter NOS, multiple GI surgeries established in February 2023.  Patient has had complex abdominal hernia with scar tissues and multiple surgeries She has a history of SVT, AFL, and PAF s/p distant ablation. No evidence of Coronary disease (two recent Cts).  Patient notes that she is doing ***.   Since day prior/last visit notes *** . There are no*** interval hospital/ED visit.    No chest pain or pressure ***.  No SOB/DOE*** and no PND/Orthopnea***.  No weight gain or leg swelling***.  No palpitations or syncope ***.  Ambulatory blood pressure ***.   Past Medical History:  Diagnosis Date   Abdominal hernia 08/14/2019   Abdominal pain 06/06/2019   Acute bronchitis 05/01/2013   Acute cystitis 06/09/2010   Qualifier: Diagnosis of  By: Cori Razor LPN, Brandi     Acute renal insufficiency 08/28/2011   Allergy    Phreesia 03/16/2021   Anemia    Phreesia 10/29/2020   Anxiety    Anxiety state 03/16/2008   Qualifier: Diagnosis of  By: Dierdre Harness     Arthritis    Phreesia 10/29/2020   Back pain with radiation 07/17/2014   Bilateral hand pain 07/27/2016   Blood transfusion without reported diagnosis    Phreesia 10/29/2020   Breast cancer ([place])    L breast- 2006   Cancer ([place])    Phreesia 10/29/2020   Carpal tunnel syndrome    Chronic constipation    Chronic pain syndrome    followed by [facility]---  back   Clotting disorder ([place])    Phreesia 10/29/2020   Depression    Dermatitis 12/15/2011   Difficult intubation    Educated about COVID-19 virus infection 05/14/2019   Fall at home 01/26/2016    Family history of adverse reaction to anesthesia    MOTHER--- PONV   Fatigue 09/17/2012   Fibromyalgia    FIBROMYALGIA 03/16/2008   Qualifier: Diagnosis of  By: Dierdre Harness     GERD (gastroesophageal reflux disease)    Headache disorder 01/16/2013   Headache syndrome 01/26/2016   Hip pain, right 07/08/2019   History of MRSA infection    lip abscess   History of ovarian cyst 06/2011   s/p  BSO   History of pulmonary embolus (PE) 1997   post EP with ablation pulmonary veouns for SVT/ Atrial Fib.   History of supraventricular tachycardia    s/p  ablation 1996  and 1997  by dr Caryl Comes   History of TIA (transient ischemic attack) 1997   post op EP ablation PE   Hyperlipidemia    Hyperlipidemia LDL goal <100 03/16/2008   Qualifier: Diagnosis of  By: Dierdre Harness     Hypothyroidism    followed by pcp   Incisional hernia    Insomnia 12/15/2011   Intermittent palpitations 08/22/2017   Interstitial cystitis    09-13-2018   per pt last flare-up  May 2019 (followed by pcp)   INTERSTITIAL CYSTITIS 02/17/2011   Qualifier: Diagnosis of  By: Moshe Cipro MD, Margaret     Iron deficiency  anemia    09-13-2018  PER PT STABLE   Irregular heart rate 07/25/2013   Lipoma of back    upper   Low back pain radiating to right leg 07/04/2019   Low ferritin level 06/14/2014   MDD (major depressive disorder), single episode, in full remission ([place]) 10/27/2017   Medically noncompliant 02/28/2015   Multiple missed appointments, both follow-up appointments and lab appointments.    Metabolic syndrome X 03/23/7123   Qualifier: Diagnosis of  By: Moshe Cipro MD, Margaret  hBA1c is 5.8 in 02/2013    Migraines    Morbid obesity ([employer]) 03/27/2013   Muscle spasm 01/03/2020   Nausea alone 07/17/2014   NECK PAIN, CHRONIC 03/16/2008   Qualifier: Diagnosis of  By: Dierdre Harness     Normal coronary arteries    a. by CT 12/2018.   Obesity 03/16/2008   Qualifier: Diagnosis of  By: Dierdre Harness     Oral ulceration 08/23/2014   Presented  at 06/14/2014 visit    Other malaise and fatigue 03/16/2008   Centricity Description: FATIGUE, CHRONIC Qualifier: Diagnosis of  By: Dierdre Harness   Centricity Description: FATIGUE Qualifier: Diagnosis of  By: Claybon Jabs PA, Dawn     OVARIAN CYST 12/26/2009   Qualifier: Diagnosis of  By: Moshe Cipro MD, Margaret     Paget disease of breast, left [facility])    Paget's disease of breast, left ([place]) 03/16/2008   Qualifier: Diagnosis of  By: Dierdre Harness  Left diagnosed in 2006 F/h breast cancer x 15 family members   Partial small bowel obstruction ([place]) 07/04/2012   PONV (postoperative nausea and vomiting)    SEVERE   Postsurgical menopause 11/13/2011   Presence of IVC filter 06/06/2019   PSVT (paroxysmal supraventricular tachycardia) ([place])    [place]   Recurrent oral herpes simplex infection 10/13/2017   ROM (right otitis media) 01/23/2013   S/P insertion of IVC (inferior vena caval) filter 05/08/2005   [place] (non-retrievable)  /  dx 2019  a leg of filter is protruding thru the vena cava in to right L2 vertebral body (09-13-2018  per pt having surgery to remove filter in [place])   S/P radiofrequency ablation operation for arrhythmia 1996   1996  and 1997,   SVT and Atrial Fib   Sinusitis, chronic 01/26/2016   SMALL BOWEL OBSTRUCTION, HX OF 08/07/2008   Annotation: obstruction w/ adhesions led to partial colectomy Qualifier: Diagnosis of  By: Craige Cotta     Stroke [facility])    [place] 10/29/2020, TIAs in the past   Swelling of hand 09/17/2012   Syncope    Tachycardia 02/03/2010   Qualifier: Diagnosis of  By: Via LPN, Lynn     Thyroid disease    Phreesia 10/29/2020   Ulcer 12/15/2011   Urinary frequency 07/18/2014   Vaginitis and vulvovaginitis 05/10/2013   Vitamin D deficiency 05/05/2015   Wears glasses     Past Surgical History:  Procedure Laterality Date   ABDOMINAL HYSTERECTOMY  1987   ANTERIOR CERVICAL DECOMP/DISCECTOMY FUSION  03-07-2002   dr elsner  '@[facility]'$    C 4 -- 5    APPENDECTOMY  1980   AUGMENTATION MAMMAPLASTY Right 2006   BILATERAL SALPINGOOPHORECTOMY  07/24/2011   via [employer] Lap. w/ intraoperative perf. bowel repair   BIOPSY  05/02/2021   Procedure: BIOPSY;  Surgeon: Montez Morita, Quillian Quince, MD;  [facility];  Service: Gastroenterology;;  small bowel, mid esophagus, distal esophagus, random colon biopsies   BIOPSY  12/04/2021  Procedure: BIOPSY;  Surgeon: [name], DO;  [facility];  Service: Endoscopy;;   BIOPSY  03/31/2022   Procedure: BIOPSY;  Surgeon: Harvel Quale, MD;  [facility];  Service: Gastroenterology;;   BOTOX INJECTION N/A 03/31/2022   Procedure: BOTOX INJECTION;  Surgeon: Harvel Quale, MD;  [facility];  Service: Gastroenterology;  Laterality: N/A;   BREAST BIOPSY Right 2019   benign   BREAST ENHANCEMENT SURGERY Bilateral 1993   BREAST IMPLANT REMOVAL Bilateral    BREAST SURGERY N/A    Phreesia 10/29/2020   CARDIAC CATHETERIZATION  07-06-2003   dr Darnell Level brodie   normal coronaries and LVF   CARDIAC ELECTROPHYSIOLOGY STUDY AND ABLATION  1996  and 1997   CARDIOVASCULAR STRESS TEST  11-18-2015   dr Caryl Comes   normal nuclear study w/ no ischemia/  normal LV function and wall motion , ef 84%   CARPAL TUNNEL RELEASE Right ?   CESAREAN SECTION  1986   CESAREAN SECTION N/A    Phreesia 10/29/2020   CHOLECYSTECTOMY N/A    Phreesia 10/29/2020   COLON SURGERY     COLONOSCOPY WITH PROPOFOL N/A 05/02/2021   Procedure: COLONOSCOPY WITH PROPOFOL;  Surgeon: Harvel Quale, MD;  [facility];  Service: Gastroenterology;  Laterality: N/A;  12:30 PM   CYSTO/  HYDRODISTENTION/  INSTILATION THERAPY  MULTIPLE   ENTEROCUTANEOUS FISTULA CLOSURE  multiple   last one 2015 with small bowel resection   ESOPHAGOGASTRODUODENOSCOPY (EGD) WITH PROPOFOL N/A 05/02/2021   Procedure: ESOPHAGOGASTRODUODENOSCOPY (EGD) WITH PROPOFOL;  Surgeon: Harvel Quale,  MD;  [facility];  Service: Gastroenterology;  Laterality: N/A;   ESOPHAGOGASTRODUODENOSCOPY (EGD) WITH PROPOFOL N/A 12/04/2021   Procedure: ESOPHAGOGASTRODUODENOSCOPY (EGD) WITH PROPOFOL;  Surgeon: [name], DO;  [facility];  Service: Endoscopy;  Laterality: N/A;   ESOPHAGOGASTRODUODENOSCOPY (EGD) WITH PROPOFOL N/A 03/31/2022   Procedure: ESOPHAGOGASTRODUODENOSCOPY (EGD) WITH PROPOFOL;  Surgeon: Harvel Quale, MD;  [facility];  Service: Gastroenterology;  Laterality: N/A;  [place] / RESECTION SMALL BOWEL  11-09-2014   '@[facility]'$    FRACTURE SURGERY N/A    Phreesia 10/29/2020   JOINT REPLACEMENT N/A    Phreesia 10/29/2020   LIPOMA EXCISION Right 09/16/2018   Procedure: EXCISION LIPOMA UPPER BACK;  Surgeon: Clovis Riley, MD;  Location: [place];  Service: General;  Laterality: Right;   MASTECTOMY Left 2006   w/ reconstruction on left (paget's disease)  and right breast augmentation   POLYPECTOMY  05/02/2021   Procedure: POLYPECTOMY;  Surgeon: Harvel Quale, MD;  [facility];  Service: Gastroenterology;;  gastric   POLYPECTOMY  03/31/2022   Procedure: POLYPECTOMY;  Surgeon: Harvel Quale, MD;  [facility];  Service: Gastroenterology;;   Azzie Almas DILATION  05/02/2021   Procedure: Azzie Almas DILATION;  Surgeon: Harvel Quale, MD;  [facility];  Service: Gastroenterology;;   SMALL INTESTINE SURGERY N/A    Phreesia 10/29/2020   SPINE SURGERY N/A    Phreesia 10/29/2020   TOTAL COLECTOMY  08-04-2002    '@[facility]'$    AND CHOLECYSTECTOMY  (colonic inertia)   TRANSTHORACIC ECHOCARDIOGRAM  02/04/2016   ef 60-65%,  grade 2 diastolic dysfunction/  mild MR   TUBAL LIGATION N/A    Phreesia 03/16/2021   VENA CAVA FILTER PLACEMENT  05/08/2005   '@[facility]'$    [place] (non-retrievable)   WIDE EXCISION PERIRECTAL ABSCESSES  09-22-2005   @ [facility]    Current Medications: No outpatient medications have been marked as taking for the 05/18/22 encounter (Appointment) with Werner Lean, MD.     Allergies:   Nitrofurantoin, Crestor [rosuvastatin calcium], Bisacodyl, Clarithromycin, Clindamycin hcl, Codeine, Iron sucrose, Monosodium glutamate, Scopolamine hbr, and Other   Social History   Socioeconomic History   Marital status: Married    Spouse name: Not on file   Number of children: Not on file   Years of education: Not on file   Highest education level: Not on file  Occupational History   Not on file  Tobacco Use   Smoking status: Never   Smokeless tobacco: Never  Vaping Use   Vaping Use: Never used  Substance and Sexual Activity   Alcohol use: No    Comment: twice per year   Drug use: No   Sexual activity: Yes    Birth control/protection: Surgical  Other Topics Concern   Not on file  Social History Narrative   Not on file   Social Determinants of Health   Financial Resource Strain: Not on file  Food Insecurity: Not on file  Transportation Needs: Not on file  Physical Activity: Not on file  Stress: Not on file  Social Connections: Not on file     Family History: [name] family history includes Alcohol abuse in her father; Bone cancer in her maternal grandfather; Brain cancer in her maternal aunt; Breast cancer in her cousin, maternal aunt, maternal aunt, and maternal aunt; Cancer in her maternal uncle; Colon cancer in her maternal aunt; Diabetes in her mother; Heart disease in her father and mother; Hyperlipidemia in her father; Hypertension in her father and mother; Ovarian cancer (age of onset: 6) in her cousin; Prostate cancer in her maternal uncle.  ROS:   Please see the history of present illness.     All other systems reviewed and are negative.  EKGs/Labs/Other Studies Reviewed:    The following studies were reviewed today:  EKG:   02/10/22: SR rate 92 low voltage  EKG  Recent Labs: 03/28/2022: B Natriuretic Peptide 11.0; TSH 1.282 05/12/2022: ALT 41; BUN 12; Creatinine, Ser 0.75; Hemoglobin 13.5; Magnesium 1.6; Platelets 261; Potassium 4.1; Sodium 138  Recent Lipid Panel    Component Value Date/Time   CHOL 127 09/11/2021 1401   CHOL 146 01/15/2021 1540   TRIG 278 (H) 09/11/2021 1401   HDL 37 (L) 09/11/2021 1401   HDL 42 01/15/2021 1540   CHOLHDL 3.4 09/11/2021 1401   VLDL 56 (H) 09/11/2021 1401   LDLCALC 34 09/11/2021 1401   LDLCALC 60 01/15/2021 1540   LDLDIRECT 138 (H) 09/15/2012 1540    Physical Exam:    VS:  There were no vitals taken for this visit.    Wt Readings from Last 3 Encounters:  05/12/22 172 lb 7 oz (78.2 kg)  05/12/22 171 lb 1.9 oz (77.6 kg)  03/28/22 177 lb (80.3 kg)    Gen: no distress,    Neck: No JVD  Cardiac: No Rubs or Gallops, no murmur, RRR and +2 radial pulses Respiratory: Clear to auscultation bilaterally, normal effort, normal  respiratory rate GI: Soft, nontender, non-distended  MS: No  edema;  moves all extremities Integument: Skin feels warm Neuro:  At time of evaluation, alert and oriented to person/place/time/situation  Psych: Anxious mood and affect   ASSESSMENT:    No diagnosis found.  PLAN:    Non Cardiac Chest Pain History of SVT, AF and AFL  s/p prior ablation HTN  - patient was prior on metoprolol 25 mg PO PRN palpitations, after heart monitor we may return this - continue Zetia 10 mg PO and pravastatin, will set LDL goal post repeat CCTA           Medication Adjustments/Labs and Tests Ordered: Current medicines are reviewed at length with the patient today.  Concerns regarding medicines are outlined above.  No orders of the defined types were placed in this encounter.  No orders of the defined types were placed in this encounter.   There are no Patient Instructions on file for this visit.   Signed, Werner Lean, MD  05/17/2022 2:56 PM    [place]  .[facility]

## 2022-05-18 ENCOUNTER — Ambulatory Visit: Payer: Medicare Other | Admitting: Internal Medicine

## 2022-05-18 ENCOUNTER — Other Ambulatory Visit (INDEPENDENT_AMBULATORY_CARE_PROVIDER_SITE_OTHER): Payer: Self-pay | Admitting: Gastroenterology

## 2022-05-18 ENCOUNTER — Other Ambulatory Visit: Payer: Self-pay | Admitting: Family Medicine

## 2022-05-18 ENCOUNTER — Other Ambulatory Visit: Payer: Self-pay

## 2022-05-18 MED ORDER — PREMARIN 0.625 MG/GM VA CREA
1.0000 | TOPICAL_CREAM | Freq: Every day | VAGINAL | 0 refills | Status: DC
Start: 2022-05-18 — End: 2022-12-11

## 2022-05-19 ENCOUNTER — Other Ambulatory Visit: Payer: Self-pay | Admitting: Family Medicine

## 2022-05-21 ENCOUNTER — Other Ambulatory Visit: Payer: Self-pay | Admitting: Family Medicine

## 2022-05-21 DIAGNOSIS — Z1231 Encounter for screening mammogram for malignant neoplasm of breast: Secondary | ICD-10-CM

## 2022-05-22 DIAGNOSIS — K599 Functional intestinal disorder, unspecified: Secondary | ICD-10-CM | POA: Diagnosis not present

## 2022-05-22 DIAGNOSIS — K56600 Partial intestinal obstruction, unspecified as to cause: Secondary | ICD-10-CM | POA: Diagnosis not present

## 2022-05-25 ENCOUNTER — Other Ambulatory Visit: Payer: Self-pay | Admitting: Family Medicine

## 2022-05-25 DIAGNOSIS — Z86711 Personal history of pulmonary embolism: Secondary | ICD-10-CM

## 2022-05-27 ENCOUNTER — Other Ambulatory Visit: Payer: Self-pay | Admitting: Family Medicine

## 2022-05-29 ENCOUNTER — Encounter: Payer: Self-pay | Admitting: Family Medicine

## 2022-06-01 ENCOUNTER — Telehealth: Payer: Self-pay

## 2022-06-01 NOTE — Telephone Encounter (Signed)
Patient called need med refill  ALPRAZolam [name]) 1 MG tablet   Pharmacy: [facility]

## 2022-06-02 ENCOUNTER — Other Ambulatory Visit: Payer: Self-pay | Admitting: Family Medicine

## 2022-06-02 MED ORDER — ALPRAZOLAM 1 MG PO TABS: 1.0000 mg | ORAL_TABLET | Freq: Two times a day (BID) | ORAL | 5 refills | Status: DC

## 2022-06-02 NOTE — Telephone Encounter (Signed)
It was [facility] to [facility]

## 2022-06-02 NOTE — Telephone Encounter (Signed)
Patient called pharmacy has not received yet.

## 2022-06-03 ENCOUNTER — Inpatient Hospital Stay (HOSPITAL_COMMUNITY)
Admission: EM | Admit: 2022-06-03 | Discharge: 2022-06-08 | DRG: 392 | Disposition: A | Discharge status: Home / Self Care | Payer: Medicare Other | Attending: Internal Medicine | Admitting: Internal Medicine

## 2022-06-03 ENCOUNTER — Ambulatory Visit: Payer: Medicare Other

## 2022-06-03 ENCOUNTER — Other Ambulatory Visit: Payer: Self-pay | Admitting: Family Medicine

## 2022-06-03 ENCOUNTER — Encounter (HOSPITAL_COMMUNITY): Payer: Self-pay | Admitting: Emergency Medicine

## 2022-06-03 ENCOUNTER — Other Ambulatory Visit: Payer: Self-pay

## 2022-06-03 ENCOUNTER — Emergency Department (HOSPITAL_COMMUNITY): Payer: Medicare Other

## 2022-06-03 DIAGNOSIS — E860 Dehydration: Secondary | ICD-10-CM | POA: Diagnosis present

## 2022-06-03 DIAGNOSIS — R634 Abnormal weight loss: Secondary | ICD-10-CM | POA: Diagnosis not present

## 2022-06-03 DIAGNOSIS — Z83438 Family history of other disorder of lipoprotein metabolism and other lipidemia: Secondary | ICD-10-CM

## 2022-06-03 DIAGNOSIS — E785 Hyperlipidemia, unspecified: Secondary | ICD-10-CM | POA: Diagnosis present

## 2022-06-03 DIAGNOSIS — Z86711 Personal history of pulmonary embolism: Secondary | ICD-10-CM

## 2022-06-03 DIAGNOSIS — I10 Essential (primary) hypertension: Secondary | ICD-10-CM

## 2022-06-03 DIAGNOSIS — K3184 Gastroparesis: Secondary | ICD-10-CM | POA: Diagnosis not present

## 2022-06-03 DIAGNOSIS — Z888 Allergy status to other drugs, medicaments and biological substances status: Secondary | ICD-10-CM

## 2022-06-03 DIAGNOSIS — Z95828 Presence of other vascular implants and grafts: Secondary | ICD-10-CM

## 2022-06-03 DIAGNOSIS — E039 Hypothyroidism, unspecified: Secondary | ICD-10-CM | POA: Diagnosis present

## 2022-06-03 DIAGNOSIS — G894 Chronic pain syndrome: Secondary | ICD-10-CM | POA: Diagnosis present

## 2022-06-03 DIAGNOSIS — D509 Iron deficiency anemia, unspecified: Secondary | ICD-10-CM | POA: Diagnosis present

## 2022-06-03 DIAGNOSIS — Z9109 Other allergy status, other than to drugs and biological substances: Secondary | ICD-10-CM

## 2022-06-03 DIAGNOSIS — Z86718 Personal history of other venous thrombosis and embolism: Secondary | ICD-10-CM

## 2022-06-03 DIAGNOSIS — Z885 Allergy status to narcotic agent status: Secondary | ICD-10-CM

## 2022-06-03 DIAGNOSIS — A049 Bacterial intestinal infection, unspecified: Secondary | ICD-10-CM | POA: Diagnosis present

## 2022-06-03 DIAGNOSIS — Z79899 Other long term (current) drug therapy: Secondary | ICD-10-CM

## 2022-06-03 DIAGNOSIS — F411 Generalized anxiety disorder: Secondary | ICD-10-CM | POA: Diagnosis not present

## 2022-06-03 DIAGNOSIS — Z853 Personal history of malignant neoplasm of breast: Secondary | ICD-10-CM | POA: Diagnosis not present

## 2022-06-03 DIAGNOSIS — Z7984 Long term (current) use of oral hypoglycemic drugs: Secondary | ICD-10-CM

## 2022-06-03 DIAGNOSIS — R109 Unspecified abdominal pain: Secondary | ICD-10-CM | POA: Diagnosis not present

## 2022-06-03 DIAGNOSIS — A0832 Astrovirus enteritis: Principal | ICD-10-CM | POA: Diagnosis present

## 2022-06-03 DIAGNOSIS — R1310 Dysphagia, unspecified: Secondary | ICD-10-CM | POA: Diagnosis present

## 2022-06-03 DIAGNOSIS — Z79890 Hormone replacement therapy (postmenopausal): Secondary | ICD-10-CM

## 2022-06-03 DIAGNOSIS — Z90710 Acquired absence of both cervix and uterus: Secondary | ICD-10-CM

## 2022-06-03 DIAGNOSIS — Z9049 Acquired absence of other specified parts of digestive tract: Secondary | ICD-10-CM

## 2022-06-03 DIAGNOSIS — R1031 Right lower quadrant pain: Secondary | ICD-10-CM | POA: Diagnosis not present

## 2022-06-03 DIAGNOSIS — K56609 Unspecified intestinal obstruction, unspecified as to partial versus complete obstruction: Secondary | ICD-10-CM | POA: Diagnosis not present

## 2022-06-03 DIAGNOSIS — Z7982 Long term (current) use of aspirin: Secondary | ICD-10-CM

## 2022-06-03 DIAGNOSIS — K219 Gastro-esophageal reflux disease without esophagitis: Secondary | ICD-10-CM | POA: Diagnosis present

## 2022-06-03 DIAGNOSIS — K56600 Partial intestinal obstruction, unspecified as to cause: Secondary | ICD-10-CM | POA: Diagnosis not present

## 2022-06-03 DIAGNOSIS — Z881 Allergy status to other antibiotic agents status: Secondary | ICD-10-CM

## 2022-06-03 DIAGNOSIS — Z8249 Family history of ischemic heart disease and other diseases of the circulatory system: Secondary | ICD-10-CM

## 2022-06-03 DIAGNOSIS — E876 Hypokalemia: Secondary | ICD-10-CM | POA: Diagnosis not present

## 2022-06-03 DIAGNOSIS — Z8619 Personal history of other infectious and parasitic diseases: Secondary | ICD-10-CM | POA: Diagnosis not present

## 2022-06-03 DIAGNOSIS — Z803 Family history of malignant neoplasm of breast: Secondary | ICD-10-CM

## 2022-06-03 DIAGNOSIS — Z9012 Acquired absence of left breast and nipple: Secondary | ICD-10-CM

## 2022-06-03 DIAGNOSIS — D72819 Decreased white blood cell count, unspecified: Secondary | ICD-10-CM | POA: Diagnosis present

## 2022-06-03 DIAGNOSIS — Z8719 Personal history of other diseases of the digestive system: Secondary | ICD-10-CM | POA: Diagnosis not present

## 2022-06-03 DIAGNOSIS — F1120 Opioid dependence, uncomplicated: Secondary | ICD-10-CM | POA: Diagnosis present

## 2022-06-03 DIAGNOSIS — R1084 Generalized abdominal pain: Secondary | ICD-10-CM | POA: Diagnosis not present

## 2022-06-03 DIAGNOSIS — M797 Fibromyalgia: Secondary | ICD-10-CM | POA: Diagnosis present

## 2022-06-03 DIAGNOSIS — R1110 Vomiting, unspecified: Secondary | ICD-10-CM | POA: Diagnosis not present

## 2022-06-03 DIAGNOSIS — Z8673 Personal history of transient ischemic attack (TIA), and cerebral infarction without residual deficits: Secondary | ICD-10-CM

## 2022-06-03 DIAGNOSIS — Z800 Family history of malignant neoplasm of digestive organs: Secondary | ICD-10-CM

## 2022-06-03 DIAGNOSIS — Z9882 Breast implant status: Secondary | ICD-10-CM

## 2022-06-03 DIAGNOSIS — E538 Deficiency of other specified B group vitamins: Secondary | ICD-10-CM | POA: Diagnosis not present

## 2022-06-03 DIAGNOSIS — Z6828 Body mass index (BMI) 28.0-28.9, adult: Secondary | ICD-10-CM

## 2022-06-03 DIAGNOSIS — Z7901 Long term (current) use of anticoagulants: Secondary | ICD-10-CM

## 2022-06-03 LAB — URINALYSIS, ROUTINE W REFLEX MICROSCOPIC
Bilirubin Urine: NEGATIVE
Glucose, UA: NEGATIVE mg/dL
Hgb urine dipstick: NEGATIVE
Ketones, ur: 20 mg/dL — AB
Leukocytes,Ua: NEGATIVE
Nitrite: NEGATIVE
Protein, ur: NEGATIVE mg/dL
Specific Gravity, Urine: 1.015 (ref 1.005–1.030)
pH: 5 (ref 5.0–8.0)

## 2022-06-03 LAB — CBC WITH DIFFERENTIAL/PLATELET
Abs Immature Granulocytes: 0.01 10*3/uL (ref 0.00–0.07)
Basophils Absolute: 0 10*3/uL (ref 0.0–0.1)
Basophils Relative: 1 %
Eosinophils Absolute: 0 10*3/uL (ref 0.0–0.5)
Eosinophils Relative: 0 %
HCT: 42.8 % (ref 36.0–46.0)
Hemoglobin: 14.1 g/dL (ref 12.0–15.0)
Immature Granulocytes: 1 %
Lymphocytes Relative: 20 %
Lymphs Abs: 0.4 10*3/uL — ABNORMAL LOW (ref 0.7–4.0)
MCH: 30.4 pg (ref 26.0–34.0)
MCHC: 32.9 g/dL (ref 30.0–36.0)
MCV: 92.2 fL (ref 80.0–100.0)
Monocytes Absolute: 0.2 10*3/uL (ref 0.1–1.0)
Monocytes Relative: 11 %
Neutro Abs: 1.5 10*3/uL — ABNORMAL LOW (ref 1.7–7.7)
Neutrophils Relative %: 67 %
Platelets: 189 10*3/uL (ref 150–400)
RBC: 4.64 MIL/uL (ref 3.87–5.11)
RDW: 14.6 % (ref 11.5–15.5)
WBC: 2.2 10*3/uL — ABNORMAL LOW (ref 4.0–10.5)
nRBC: 0 % (ref 0.0–0.2)

## 2022-06-03 LAB — COMPREHENSIVE METABOLIC PANEL
ALT: 63 U/L — ABNORMAL HIGH (ref 0–44)
AST: 47 U/L — ABNORMAL HIGH (ref 15–41)
Albumin: 3.8 g/dL (ref 3.5–5.0)
Alkaline Phosphatase: 137 U/L — ABNORMAL HIGH (ref 38–126)
Anion gap: 8 (ref 5–15)
BUN: 18 mg/dL (ref 6–20)
CO2: 28 mmol/L (ref 22–32)
Calcium: 8.7 mg/dL — ABNORMAL LOW (ref 8.9–10.3)
Chloride: 103 mmol/L (ref 98–111)
Creatinine, Ser: 0.79 mg/dL (ref 0.44–1.00)
GFR, Estimated: >60 mL/min (ref >60)
Glucose, Bld: 102 mg/dL — ABNORMAL HIGH (ref 70–99)
Potassium: 3.6 mmol/L (ref 3.5–5.1)
Sodium: 139 mmol/L (ref 135–145)
Total Bilirubin: 0.8 mg/dL (ref 0.3–1.2)
Total Protein: 6.7 g/dL (ref 6.5–8.1)

## 2022-06-03 LAB — MAGNESIUM: Magnesium: 1.6 mg/dL — ABNORMAL LOW (ref 1.7–2.4)

## 2022-06-03 LAB — LIPASE, BLOOD: Lipase: 21 U/L (ref 11–51)

## 2022-06-03 LAB — PHOSPHORUS: Phosphorus: 3.8 mg/dL (ref 2.5–4.6)

## 2022-06-03 MED ORDER — ONDANSETRON HCL 4 MG/2ML IJ SOLN
4.0000 mg | Freq: Once | INTRAMUSCULAR | Status: AC
  Administered 2022-06-03: 4 mg via INTRAVENOUS
  Filled 2022-06-03: qty 2

## 2022-06-03 MED ORDER — MAGNESIUM SULFATE 2 GM/50ML IV SOLN
2.0000 g | Freq: Once | INTRAVENOUS | Status: AC
  Administered 2022-06-03: 2 g via INTRAVENOUS
  Filled 2022-06-03: qty 50

## 2022-06-03 MED ORDER — SODIUM CHLORIDE 0.9 % IV BOLUS
500.0000 mL | Freq: Once | INTRAVENOUS | Status: AC
  Administered 2022-06-03: 500 mL via INTRAVENOUS

## 2022-06-03 MED ORDER — HYDROMORPHONE HCL 1 MG/ML IJ SOLN
0.5000 mg | Freq: Once | INTRAMUSCULAR | Status: AC
  Administered 2022-06-03: 0.5 mg via INTRAVENOUS
  Filled 2022-06-03: qty 0.5

## 2022-06-03 MED ORDER — LACTATED RINGERS IV SOLN: INTRAVENOUS | Status: DC

## 2022-06-03 MED ORDER — HYDROMORPHONE HCL 1 MG/ML IJ SOLN
1.0000 mg | Freq: Once | INTRAMUSCULAR | Status: AC
  Administered 2022-06-03: 1 mg via INTRAVENOUS
  Filled 2022-06-03: qty 1

## 2022-06-03 MED ORDER — LEVOTHYROXINE SODIUM 25 MCG PO TABS
125.0000 ug | ORAL_TABLET | Freq: Every day | ORAL | Status: DC
Start: 2022-06-04 — End: 2022-06-08
  Administered 2022-06-04 – 2022-06-08 (×5): 125 ug via ORAL
  Filled 2022-06-03 (×5): qty 1

## 2022-06-03 MED ORDER — FLUTICASONE PROPIONATE 50 MCG/ACT NA SUSP
2.0000 | Freq: Every day | NASAL | Status: DC | PRN
Start: 2022-06-03 — End: 2022-06-08

## 2022-06-03 MED ORDER — MAGNESIUM SULFATE IN D5W 1-5 GM/100ML-% IV SOLN
1.0000 g | Freq: Once | INTRAVENOUS | Status: AC
  Administered 2022-06-03: 1 g via INTRAVENOUS
  Filled 2022-06-03: qty 100

## 2022-06-03 MED ORDER — SODIUM CHLORIDE 0.9 % IV BOLUS
1000.0000 mL | Freq: Once | INTRAVENOUS | Status: AC
  Administered 2022-06-03: 1000 mL via INTRAVENOUS

## 2022-06-03 MED ORDER — SODIUM CHLORIDE 0.9 % IV SOLN: INTRAVENOUS | Status: DC

## 2022-06-03 MED ORDER — DULOXETINE HCL 60 MG PO CPEP
60.0000 mg | ORAL_CAPSULE | Freq: Every day | ORAL | Status: DC
  Administered 2022-06-03 – 2022-06-08 (×6): 60 mg via ORAL
  Filled 2022-06-03 (×6): qty 1

## 2022-06-03 MED ORDER — LUBIPROSTONE 24 MCG PO CAPS
24.0000 ug | ORAL_CAPSULE | Freq: Two times a day (BID) | ORAL | Status: DC
  Administered 2022-06-04: 24 ug via ORAL
  Filled 2022-06-03: qty 1

## 2022-06-03 MED ORDER — CYCLOSPORINE 0.05 % OP EMUL: 1.0000 [drp] | Freq: Two times a day (BID) | OPHTHALMIC | Status: DC | PRN

## 2022-06-03 MED ORDER — BUTALBITAL-APAP-CAFFEINE 50-325-40 MG PO TABS
1.0000 | ORAL_TABLET | Freq: Four times a day (QID) | ORAL | Status: DC | PRN
Start: 2022-06-03 — End: 2022-06-08

## 2022-06-03 MED ORDER — PANTOPRAZOLE SODIUM 40 MG IV SOLR
40.0000 mg | INTRAVENOUS | Status: DC
  Administered 2022-06-03: 40 mg via INTRAVENOUS
  Filled 2022-06-03: qty 10

## 2022-06-03 MED ORDER — POTASSIUM CHLORIDE 10 MEQ/100ML IV SOLN
10.0000 meq | INTRAVENOUS | Status: AC
  Administered 2022-06-03 (×2): 10 meq via INTRAVENOUS
  Filled 2022-06-03 (×2): qty 100

## 2022-06-03 MED ORDER — ALPRAZOLAM 1 MG PO TABS
1.0000 mg | ORAL_TABLET | Freq: Two times a day (BID) | ORAL | Status: DC
  Administered 2022-06-03 – 2022-06-08 (×10): 1 mg via ORAL
  Filled 2022-06-03 (×10): qty 1

## 2022-06-03 MED ORDER — IOHEXOL 300 MG/ML  SOLN
100.0000 mL | Freq: Once | INTRAMUSCULAR | Status: AC | PRN
  Administered 2022-06-03: 100 mL via INTRAVENOUS

## 2022-06-03 MED ORDER — ESTROGENS CONJUGATED 0.625 MG/GM VA CREA
1.0000 | TOPICAL_CREAM | Freq: Every day | VAGINAL | Status: DC
  Administered 2022-06-04 – 2022-06-07 (×4): 1 via VAGINAL
  Filled 2022-06-03 (×2): qty 30

## 2022-06-03 MED ORDER — HYDROMORPHONE HCL 1 MG/ML IJ SOLN
1.0000 mg | INTRAMUSCULAR | Status: DC | PRN
  Administered 2022-06-03 – 2022-06-07 (×18): 1 mg via INTRAVENOUS
  Filled 2022-06-03 (×18): qty 1

## 2022-06-03 MED ORDER — ENOXAPARIN SODIUM 80 MG/0.8ML IJ SOSY
80.0000 mg | PREFILLED_SYRINGE | Freq: Two times a day (BID) | INTRAMUSCULAR | Status: DC
  Administered 2022-06-03 – 2022-06-08 (×10): 80 mg via SUBCUTANEOUS
  Filled 2022-06-03 (×10): qty 0.8

## 2022-06-03 NOTE — ED Notes (Signed)
Med rec tech finished at [facility]. Admitting MD into room, at [facility]. Pt alert, NAD, calm, interactive, resps e/u, speaking in clear complete sentences. Family at [facility].

## 2022-06-03 NOTE — H&P (Signed)
TRH H&P   Patient Demographics:    [name], is a 61 y.o. female  MRN: [id number]   DOB - [birth year]  Admit Date - 06/03/2022  Outpatient Primary MD for the patient is Fayrene Helper, MD  Referring MD/NP/PA: PA Triplett  Patient coming from: home  Chief Complaint  Patient presents with   Abdominal Pain      HPI:    [name]  is a 61 y.o. female, with medical history significant for complicated [facility], chronic diarrhea, hypertension, DVT and PE, fibromyalgia with chronic pain, atrial fibrillation status post ablation. -Patient presents to ED secondary to complaints of nausea, vomiting and abdominal pain, as well she is having diarrhea at baseline which she is continuing to have, patient reports symptoms going on over last 2 days, she reports multiple episodes of vomiting, no blood, diarrhea as well, she does report nausea, abdominal pain, unable to tolerate any fluids or liquids, reports she is feeling dehydrated, as well she does report urinary symptoms, but I think this more due to dehydration and not related to infection, as well she does report epigastric abdominal pain, similar to ovulation pain, but she has hysterectomy, no hematochezia, no hematemesis, no fever, no chills, -In ED patient continued to have nausea, retching, lab were significant for low magnesium at 1.6, and white blood cell of 2.2, CT abdomen pelvis with contrast significant for small bowel loops chronic dilation in the right abdomen, with scattered air-fluid level, and fluid throughout the remaining distal colon and rectum, but partial mechanical distal small bowel obstruction could not be excluded given absence of oral contrast and the scan, [facility] hospitalist consulted to admit.    Review of systems:      A full 10 point Review of Systems was done, except as stated  above, all other Review of Systems were negative.   With Past History of the following :    Past Medical History:  Diagnosis Date   Abdominal hernia 08/14/2019   Abdominal pain 06/06/2019   Acute bronchitis 05/01/2013   Acute cystitis 06/09/2010   Qualifier: Diagnosis of  By: Cori Razor LPN, Brandi     Acute renal insufficiency 08/28/2011   Allergy    Phreesia 03/16/2021   Anemia    Phreesia 10/29/2020   Anxiety    Anxiety state 03/16/2008   Qualifier: Diagnosis of  By: Dierdre Harness     Arthritis    Phreesia 10/29/2020   Back pain with radiation 07/17/2014   Bilateral hand pain 07/27/2016   Blood transfusion without reported diagnosis    Phreesia 10/29/2020   Breast cancer ([place])    L breast- 2006   Cancer ([place])    Phreesia 10/29/2020   Carpal tunnel syndrome    Chronic constipation    Chronic pain syndrome    followed by [facility]---  back   Clotting  disorder ([place])    Phreesia 10/29/2020   Depression    Dermatitis 12/15/2011   Difficult intubation    Educated about COVID-19 virus infection 05/14/2019   Fall at home 01/26/2016   Family history of adverse reaction to anesthesia    MOTHER--- PONV   Fatigue 09/17/2012   Fibromyalgia    FIBROMYALGIA 03/16/2008   Qualifier: Diagnosis of  By: Dierdre Harness     GERD (gastroesophageal reflux disease)    Headache disorder 01/16/2013   Headache syndrome 01/26/2016   Hip pain, right 07/08/2019   History of MRSA infection    lip abscess   History of ovarian cyst 06/2011   s/p  BSO   History of pulmonary embolus (PE) 1997   post EP with ablation pulmonary veouns for SVT/ Atrial Fib.   History of supraventricular tachycardia    s/p  ablation 1996  and 1997  by dr Caryl Comes   History of TIA (transient ischemic attack) 1997   post op EP ablation PE   Hyperlipidemia    Hyperlipidemia LDL goal <100 03/16/2008   Qualifier: Diagnosis of  By: Dierdre Harness     Hypothyroidism    followed by pcp   Incisional hernia    Insomnia 12/15/2011    Intermittent palpitations 08/22/2017   Interstitial cystitis    09-13-2018   per pt last flare-up  May 2019 (followed by pcp)   INTERSTITIAL CYSTITIS 02/17/2011   Qualifier: Diagnosis of  By: Moshe Cipro MD, Margaret     Iron deficiency anemia    09-13-2018  PER PT STABLE   Irregular heart rate 07/25/2013   Lipoma of back    upper   Low back pain radiating to right leg 07/04/2019   Low ferritin level 06/14/2014   MDD (major depressive disorder), single episode, in full remission ([place]) 10/27/2017   Medically noncompliant 02/28/2015   Multiple missed appointments, both follow-up appointments and lab appointments.    Metabolic syndrome X 0/53/9767   Qualifier: Diagnosis of  By: Moshe Cipro MD, Margaret  hBA1c is 5.8 in 02/2013    Migraines    Morbid obesity ([place]) 03/27/2013   Muscle spasm 01/03/2020   Nausea alone 07/17/2014   NECK PAIN, CHRONIC 03/16/2008   Qualifier: Diagnosis of  By: Dierdre Harness     Normal coronary arteries    a. by CT 12/2018.   Obesity 03/16/2008   Qualifier: Diagnosis of  By: Dierdre Harness     Oral ulceration 08/23/2014   Presented at 06/14/2014 visit    Other malaise and fatigue 03/16/2008   Centricity Description: FATIGUE, CHRONIC Qualifier: Diagnosis of  By: Dierdre Harness   Centricity Description: FATIGUE Qualifier: Diagnosis of  By: Claybon Jabs PA, [place]     OVARIAN CYST 12/26/2009   Qualifier: Diagnosis of  By: Moshe Cipro MD, Margaret     Paget disease of breast, left [facility])    Paget's disease of breast, left ([place]) 03/16/2008   Qualifier: Diagnosis of  By: Dierdre Harness  Left diagnosed in 2006 F/h breast cancer x 15 family members   Partial small bowel obstruction ([place]) 07/04/2012   PONV (postoperative nausea and vomiting)    SEVERE   Postsurgical menopause 11/13/2011   Presence of IVC filter 06/06/2019   PSVT (paroxysmal supraventricular tachycardia) ([place])    [place]   Recurrent oral herpes simplex infection 10/13/2017   ROM (right otitis media) 01/23/2013    S/P insertion of IVC (inferior vena caval) filter 05/08/2005   [place] (non-retrievable)  /  dx 2019  a leg of filter is protruding thru the vena cava in to right L2 vertebral body (09-13-2018  per pt having surgery to remove filter in [place])   S/P radiofrequency ablation operation for arrhythmia 1996   1996  and 1997,   SVT and Atrial Fib   Sinusitis, chronic 01/26/2016   SMALL BOWEL OBSTRUCTION, HX OF 08/07/2008   Annotation: obstruction w/ adhesions led to partial colectomy Qualifier: Diagnosis of  By: Craige Cotta     Stroke [facility])    [place] 10/29/2020, TIAs in the past   Swelling of hand 09/17/2012   Syncope    Tachycardia 02/03/2010   Qualifier: Diagnosis of  By: Via LPN, Lynn     Thyroid disease    Phreesia 10/29/2020   Ulcer 12/15/2011   Urinary frequency 07/18/2014   Vaginitis and vulvovaginitis 05/10/2013   Vitamin D deficiency 05/05/2015   Wears glasses       Past Surgical History:  Procedure Laterality Date   ABDOMINAL HYSTERECTOMY  1987   ANTERIOR CERVICAL DECOMP/DISCECTOMY FUSION  03-07-2002   dr elsner  '@[facility]'$    C 4 -- 5   APPENDECTOMY  1980   AUGMENTATION MAMMAPLASTY Right 2006   BILATERAL SALPINGOOPHORECTOMY  07/24/2011   via [facility] Lap. w/ intraoperative perf. bowel repair   BIOPSY  05/02/2021   Procedure: BIOPSY;  Surgeon: Montez Morita, Quillian Quince, MD;  [facility];  Service: Gastroenterology;;  small bowel, mid esophagus, distal esophagus, random colon biopsies   BIOPSY  12/04/2021   Procedure: BIOPSY;  Surgeon: [name], DO;  [facility];  Service: Endoscopy;;   BIOPSY  03/31/2022   Procedure: BIOPSY;  Surgeon: Harvel Quale, MD;  [facility];  Service: Gastroenterology;;   BOTOX INJECTION N/A 03/31/2022   Procedure: BOTOX INJECTION;  Surgeon: Harvel Quale, MD;  [facility];  Service: Gastroenterology;  Laterality: N/A;   BREAST BIOPSY Right 2019   benign   BREAST ENHANCEMENT  SURGERY Bilateral 1993   BREAST IMPLANT REMOVAL Bilateral    BREAST SURGERY N/A    Phreesia 10/29/2020   CARDIAC CATHETERIZATION  07-06-2003   dr Darnell Level brodie   normal coronaries and LVF   CARDIAC ELECTROPHYSIOLOGY STUDY AND ABLATION  1996  and 1997   CARDIOVASCULAR STRESS TEST  11-18-2015   dr Caryl Comes   normal nuclear study w/ no ischemia/  normal LV function and wall motion , ef 84%   CARPAL TUNNEL RELEASE Right ?   CESAREAN SECTION  1986   CESAREAN SECTION N/A    Phreesia 10/29/2020   CHOLECYSTECTOMY N/A    Phreesia 10/29/2020   COLON SURGERY     COLONOSCOPY WITH PROPOFOL N/A 05/02/2021   Procedure: COLONOSCOPY WITH PROPOFOL;  Surgeon: Harvel Quale, MD;  [facility];  Service: Gastroenterology;  Laterality: N/A;  12:30 PM   CYSTO/  HYDRODISTENTION/  INSTILATION THERAPY  MULTIPLE   ENTEROCUTANEOUS FISTULA CLOSURE  multiple   last one 2015 with small bowel resection   ESOPHAGOGASTRODUODENOSCOPY (EGD) WITH PROPOFOL N/A 05/02/2021   Procedure: ESOPHAGOGASTRODUODENOSCOPY (EGD) WITH PROPOFOL;  Surgeon: Harvel Quale, MD;  [facility];  Service: Gastroenterology;  Laterality: N/A;   ESOPHAGOGASTRODUODENOSCOPY (EGD) WITH PROPOFOL N/A 12/04/2021   Procedure: ESOPHAGOGASTRODUODENOSCOPY (EGD) WITH PROPOFOL;  Surgeon: [name], DO;  [facility];  Service: Endoscopy;  Laterality: N/A;   ESOPHAGOGASTRODUODENOSCOPY (EGD) WITH PROPOFOL N/A 03/31/2022   Procedure: ESOPHAGOGASTRODUODENOSCOPY (EGD) WITH PROPOFOL;  Surgeon: Jenetta Downer  Roney Marion, MD;  [facility];  Service: Gastroenterology;  Laterality: N/A;  [place] / RESECTION SMALL BOWEL  11-09-2014   '@[facility]'$    FRACTURE SURGERY N/A    Phreesia 10/29/2020   JOINT REPLACEMENT N/A    Phreesia 10/29/2020   LIPOMA EXCISION Right 09/16/2018   Procedure: EXCISION LIPOMA UPPER BACK;  Surgeon: Clovis Riley, MD;  Location:  [place];  Service: General;  Laterality: Right;   MASTECTOMY Left 2006   w/ reconstruction on left (paget's disease)  and right breast augmentation   POLYPECTOMY  05/02/2021   Procedure: POLYPECTOMY;  Surgeon: Harvel Quale, MD;  [facility];  Service: Gastroenterology;;  gastric   POLYPECTOMY  03/31/2022   Procedure: POLYPECTOMY;  Surgeon: Harvel Quale, MD;  [facility];  Service: Gastroenterology;;   Azzie Almas DILATION  05/02/2021   Procedure: Azzie Almas DILATION;  Surgeon: Montez Morita, Quillian Quince, MD;  [facility];  Service: Gastroenterology;;   SMALL INTESTINE SURGERY N/A    Phreesia 10/29/2020   SPINE SURGERY N/A    Phreesia 10/29/2020   TOTAL COLECTOMY  08-04-2002    '@[facility]'$    AND CHOLECYSTECTOMY  (colonic inertia)   TRANSTHORACIC ECHOCARDIOGRAM  02/04/2016   ef 60-65%,  grade 2 diastolic dysfunction/  mild MR   TUBAL LIGATION N/A    Phreesia 03/16/2021   VENA CAVA FILTER PLACEMENT  05/08/2005   '@[facility]'$    [place] (non-retrievable)   WIDE EXCISION PERIRECTAL ABSCESSES  09-22-2005   @ [facility]      Social History:     Social History   Tobacco Use   Smoking status: Never   Smokeless tobacco: Never  Substance Use Topics   Alcohol use: No    Comment: twice per year      Family History :     Family History  Problem Relation Age of Onset   Diabetes Mother    Heart disease Mother    Hypertension Mother    Heart disease Father    Hyperlipidemia Father    Hypertension Father    Alcohol abuse Father    Colon cancer Maternal Aunt    Breast cancer Maternal Aunt    Cancer Maternal Uncle        mets   Bone cancer Maternal Grandfather        mets   Ovarian cancer Cousin 19   Breast cancer Cousin    Prostate cancer Maternal Uncle    Breast cancer Maternal Aunt    Brain cancer Maternal Aunt    Breast cancer Maternal Aunt      Prior to Admission medications   Medication Sig Start Date End Date Taking?  Authorizing Provider  ALPRAZolam [name]) 1 MG tablet Take 1 tablet (1 mg total) by mouth 2 (two) times daily. 04/29/22  Yes Fayrene Helper, MD  DULoxetine (CYMBALTA) 60 MG capsule Take 60 mg by mouth daily. 02/09/22  Yes [provider]  DULoxetine (CYMBALTA) 60 MG capsule Take by mouth. 04/23/15  Yes [provider]  magnesium oxide (MAG-OX) 400 MG tablet magnesium oxide 400 mg (241.3 mg magnesium) tablet  TAKE 1 TABLET BY MOUTH TWICE DAILY 09/03/21  Yes [provider]  meclizine (ANTIVERT) 25 MG tablet Take by mouth. 06/11/20  Yes [provider]  ALPRAZolam [name]) 1 MG tablet Take 1 tablet (1 mg total) by mouth 2 (two) times daily. 06/02/22   Fayrene Helper, MD  aspirin  81 MG chewable tablet Chew 81 mg by mouth daily.    [provider]  BIOTIN PO Take 1 capsule by mouth daily.    [provider]  butalbital-acetaminophen-caffeine (FIORICET) 50-325-40 MG tablet Take 1 tablet by mouth every 6 (six) hours as needed. 04/11/22   [provider]  conjugated estrogens (PREMARIN) vaginal cream Place 1 Applicatorful (1 application. total) vaginally daily. 05/18/22   Fayrene Helper, MD  cyanocobalamin (,VITAMIN B-12,) 1000 MCG/ML injection Inject 1 mL (1,000 mcg total) into the muscle every 30 (thirty) days. 09/18/21   Harriett Rush, PA-C  cyclobenzaprine (FLEXERIL) 10 MG tablet TAKE 1 TABLET BY MOUTH TWICE DAILY AS NEEDED FOR MUSCLE SPASMS Patient taking differently: Take 10 mg by mouth 2 (two) times daily. 02/21/20   Fayrene Helper, MD  cycloSPORINE (RESTASIS) 0.05 % ophthalmic emulsion Place 1 drop into both eyes 2 (two) times daily as needed (dry eyes).    [provider]  erythromycin (ERY-TAB) 250 MG EC tablet TAKE 1 TABLET BY MOUTH THREE TIMES DAILY BEFORE MEALS Patient taking differently: 250 mg every evening. 04/06/22   Harvel Quale, MD  ezetimibe (ZETIA) 10 MG tablet Take 1 tablet (10 mg total)  by mouth daily. Patient taking differently: Take 10 mg by mouth at bedtime. 09/28/21   Fayrene Helper, MD  fluticasone (FLONASE) 50 MCG/ACT nasal spray Place 2 sprays into both nostrils daily as needed for allergies. 12/05/21   Johnson, Clanford L, MD  HYDROmorphone (DILAUDID) 4 MG tablet Take 4 mg by mouth 3 (three) times daily.    [provider]  levothyroxine (SYNTHROID) 125 MCG tablet TAKE 1 TABLET(125 MCG) BY MOUTH DAILY BEFORE BREAKFAST Patient taking differently: Take 125 mcg by mouth daily before breakfast. 08/26/21   Fayrene Helper, MD  lubiprostone (AMITIZA) 24 MCG capsule TAKE 1 CAPSULE(24 MCG) BY MOUTH TWICE DAILY WITH A MEAL 05/18/22   Carlan, Chelsea L, NP  magnesium oxide (MAG-OX) 400 (240 Mg) MG tablet Take 1 tablet (400 mg total) by mouth 2 (two) times daily. Please make overdue appt with Dr. Caryl Comes before anymore refills. Thank you 1st attempt 09/03/21   Deboraha Sprang, MD  metFORMIN (GLUCOPHAGE-XR) 500 MG 24 hr tablet Take 500 mg by mouth every evening. 01/15/22   [provider]  Multiple Vitamins-Minerals (ICAPS AREDS 2 PO) Take 1 capsule by mouth every evening.    [provider]  omeprazole (PRILOSEC) 40 MG capsule Take 1 capsule (40 mg total) by mouth in the morning and at bedtime. 11/18/21   Kathie Dike, MD  phenazopyridine (PYRIDIUM) 95 MG tablet Take 95 mg by mouth 3 (three) times daily as needed for pain.    [provider]  potassium chloride (KLOR-CON M) 10 MEQ tablet TAKE 1 TABLET BY MOUTH EVERY DAY 05/18/22   Fayrene Helper, MD  pravastatin (PRAVACHOL) 40 MG tablet TAKE 1 TABLET(40 MG) BY MOUTH DAILY Patient taking differently: Take 40 mg by mouth at bedtime. 01/08/22   Fayrene Helper, MD  sulfamethoxazole-trimethoprim (BACTRIM) 400-80 MG tablet Take 1 tablet by mouth 2 (two) times daily.    [provider]  SUPER B COMPLEX/C PO Take 1 capsule by mouth daily.    [provider]  XARELTO 20 MG TABS  tablet TAKE 1 TABLET(20 MG) BY MOUTH DAILY 05/26/22   Fayrene Helper, MD     Allergies:     Allergies  Allergen Reactions   Nitrofurantoin Hives   Crestor [Rosuvastatin Calcium]  Other (See Comments)    Generalized cramps   Bisacodyl Other (See Comments)    Makes patient feel like she is having cramps    Clarithromycin Hives and Other (See Comments)    Cluster migraines   Clindamycin Hcl Hives   Codeine Itching   Iron Sucrose Other (See Comments)    [place]- required benadryl and solu medrol   Monosodium Glutamate Other (See Comments)    Cluster migraines    Scopolamine Hbr Other (See Comments)    Cluster migraines, impaired vision   Other Hives, Itching, Swelling and Rash    Cigarette smoke     Physical Exam:   Vitals  Blood pressure (!) 98/58, pulse 87, temperature 98.8 F (37.1 C), temperature source Oral, resp. rate 18, height '5\' 4"'$  (1.626 m), weight 75.8 kg, SpO2 93 %.   1. General developed female, laying in bed, in mild discomfort   2. Normal affect and insight, Not Suicidal or Homicidal, Awake Alert, Oriented X 3.  3. No F.N deficits, ALL C.Nerves Intact, Strength 5/5 all 4 extremities, Sensation intact all 4 extremities, Plantars down going.  4. Ears and Eyes appear Normal, Conjunctivae clear, PERRLA. Moist Oral Mucosa.  5. Supple Neck, No JVD, No cervical lymphadenopathy appriciated, No Carotid Bruits.  6. Symmetrical Chest wall movement, Good air movement bilaterally, CTAB.  7. RRR, No Gallops, Rubs or Murmurs, No Parasternal Heave.  8. Positive Bowel Sounds, multiple scarring in abdomen, tenderness mainly in suprapubic area, and right abdomen.    9.  No Cyanosis, Normal Skin Turgor, No Skin Rash or Bruise.  10. Good muscle tone,  joints appear normal , no effusions, Normal ROM.  Patient RN was present at bedside during physical exam   Data Review:    CBC Recent Labs  Lab 06/03/22 1201  WBC 2.2*  HGB 14.1  HCT 42.8  PLT 189  MCV 92.2   MCH 30.4  MCHC 32.9  RDW 14.6  LYMPHSABS 0.4*  MONOABS 0.2  EOSABS 0.0  BASOSABS 0.0   ------------------------------------------------------------------------------------------------------------------  Chemistries  Recent Labs  Lab 06/03/22 1201  NA 139  K 3.6  CL 103  CO2 28  GLUCOSE 102*  BUN 18  CREATININE 0.79  CALCIUM 8.7*  MG 1.6*  AST 47*  ALT 63*  ALKPHOS 137*  BILITOT 0.8   ------------------------------------------------------------------------------------------------------------------ estimated creatinine clearance is 74.5 mL/min (by C-G formula based on SCr of 0.79 mg/dL). ------------------------------------------------------------------------------------------------------------------ No results for input(s): TSH, T4TOTAL, T3FREE, THYROIDAB in the last 72 hours.  Invalid input(s): FREET3  Coagulation profile No results for input(s): INR, PROTIME in the last 168 hours. ------------------------------------------------------------------------------------------------------------------- No results for input(s): DDIMER in the last 72 hours. -------------------------------------------------------------------------------------------------------------------  Cardiac Enzymes No results for input(s): CKMB, TROPONINI, MYOGLOBIN in the last 168 hours.  Invalid input(s): CK ------------------------------------------------------------------------------------------------------------------    Component Value Date/Time   BNP 11.0 03/28/2022 1642     ---------------------------------------------------------------------------------------------------------------  Urinalysis    Component Value Date/Time   COLORURINE YELLOW 06/03/2022 1346   APPEARANCEUR CLEAR 06/03/2022 1346   LABSPEC 1.015 06/03/2022 1346   PHURINE 5.0 06/03/2022 1346   GLUCOSEU NEGATIVE 06/03/2022 1346   GLUCOSEU NEG mg/dL 09/05/2007 2337   HGBUR NEGATIVE 06/03/2022 1346   HGBUR negative  03/13/2011 0957   BILIRUBINUR NEGATIVE 06/03/2022 1346   BILIRUBINUR moderate (A) 05/12/2022 1515   BILIRUBINUR neg 06/23/2018 1434   KETONESUR 20 (A) 06/03/2022 1346   PROTEINUR NEGATIVE 06/03/2022 1346   UROBILINOGEN 2.0 (A) 05/12/2022 1515   UROBILINOGEN 0.2 07/03/2015 2014  NITRITE NEGATIVE 06/03/2022 1346   LEUKOCYTESUR NEGATIVE 06/03/2022 1346    ----------------------------------------------------------------------------------------------------------------   Imaging Results:    CT ABDOMEN PELVIS W CONTRAST  Result Date: 06/03/2022 CLINICAL DATA:  Right lower quadrant abdominal pain, nausea and vomiting since last night. History of multiple abdominal surgeries and bowel obstruction. EXAM: CT ABDOMEN AND PELVIS WITH CONTRAST TECHNIQUE: Multidetector CT imaging of the abdomen and pelvis was performed using the standard protocol following bolus administration of intravenous contrast. RADIATION DOSE REDUCTION: This exam was performed according to the departmental dose-optimization program which includes automated exposure control, adjustment of the mA and/or kV according to patient size and/or use of iterative reconstruction technique. CONTRAST:  147m OMNIPAQUE IOHEXOL 300 MG/ML  SOLN COMPARISON:  03/28/2022 CT abdomen/pelvis. FINDINGS: Lower chest: No acute abnormality at the lung bases. Partially visualized right breast prosthesis. Hepatobiliary: Normal liver size. No liver mass. Cholecystectomy. Bile ducts are stable and within normal post cholecystectomy limits with CBD diameter 8 mm. Pancreas: Normal, with no mass or duct dilation. Spleen: Normal size. No mass. Adrenals/Urinary Tract: Normal adrenals. Normal kidneys with no hydronephrosis and no renal mass. Normal collapsed bladder. Stomach/Bowel: Normal non-distended stomach. Redemonstrated multiple enteroenterostomy suture lines from prior small bowel resections in the central and right abdomen. Redemonstrated subtotal colectomy with  intact appearing entero-colonic anastomosis in the central lower abdomen. Chronic dilatation of multiple small bowel loops in the right abdomen up to 4.3 cm diameter, with associated scattered air-fluid levels, not substantially changed from 03/28/2022 CT. No small bowel wall thickening. Proximal small bowel loops are nondistended. Fluid throughout the remnant rectum and large bowel. No colorectal wall thickening. Vascular/Lymphatic: Normal caliber abdominal aorta. Patent portal, splenic, hepatic and renal veins. No pathologically enlarged lymph nodes in the abdomen or pelvis. Reproductive: Status post hysterectomy, with no abnormal findings at the vaginal cuff. No adnexal mass. Other: No pneumoperitoneum, ascites or focal fluid collection. Musculoskeletal: No aggressive appearing focal osseous lesions. Mild thoracolumbar spondylosis. IMPRESSION: Chronic dilatation of multiple small bowel loops in the right abdomen with associated scattered air-fluid levels, not substantially changed from 03/28/2022 CT. Fluid throughout the remnant distal colon and rectum, suggesting a nonspecific malabsorptive state. A partial mechanical distal small bowel obstruction cannot be excluded, particularly given the absence of oral contrast on this scan. Findings may alternatively represent chronic postsurgical changes or adynamic ileus. No bowel wall thickening. Electronically Signed   By: JIlona SorrelM.D.   On: 06/03/2022 14:23     Assessment & Plan:    Active Problems:   Hypothyroidism   Hyperlipidemia LDL goal <100   Iron deficiency anemia   Anxiety state   Fibromyalgia   Hx SBO   Chronic pain syndrome   GERD (gastroesophageal reflux disease)   B12 deficiency   Essential hypertension   History of DVT (deep vein thrombosis)   Partial small bowel obstruction (HCC)    Nausea/vomiting/abdominal pain/partial SBO -With extensive and complicated bowel history including multiple abdominal surgeries multiple  facilities, "large rectus diastasis with abdominal hernia that began after an ovarian cyst removal that was converted to radical hyste[facility] in [facility] This procedure was complicated by sepsis that required enterotomy and small bowel resection followed by chronic wound vac placement.  Patient reports she had a chronic open abdomen from 2012-2015, prior SBO , with s/p  IVC filter March 2017 with subsequent IVC filter malformation and fracture"  -Imaging on admission significant questionable partial SBO, general surgery consulted by ED, current recommendation with IV fluids, diet as  tolerated, pain control, and holding on NG tube, so I will keep on a clear liquid diet and advance her as tolerated. -We will await further recommendation by general surgery tomorrow. -At her electrolyte closely and keep magnesium> 2, potassium> 4, in case she is having a dynamic ileus  chronic pain syndrome/fibromyalgia/opioids dependence -She is on Dilaudid 4 mg oral 3 times daily, given her oral intake is unreliable at this point we will keep on IV Dilaudid 1 mg every 4 hours which should be [place], I have informed the patient it will be on as needed patient for now and she has to ask for it given it is IV.  Hypomagnesemia -Repleted, will recheck phosphorus as well  History of DVT/PE -Oral intake is unreliable, will keep on subcu Lovenox full treatment dose for now   hypothyroidism -Continue with Synthroid  Anxiety state - Continue with Cymbalta and alprazolam    Metformin noted on patient's medication list, patient reports she is on metformin for weight loss, no history of diabetes, but she did not started taking metformin yet.     DVT Prophylaxis   Lovenox   AM Labs Ordered, also please review Full Orders  Family Communication: Admission, patients condition and plan of care including tests being ordered have been discussed with the patient and Husband  who indicate understanding  and agree with the plan and Code Status.  Code Status Full  Likely DC to  home  Condition GUARDED    Consults called: General surgery    Admission status: Observation    Time spent in minutes : 70 minutes   Phillips Climes M.D on 06/03/2022 at 4:57 PM   [facility] - Office  [phone]

## 2022-06-03 NOTE — Progress Notes (Signed)
ANTICOAGULATION CONSULT NOTE - Initial Consult  Pharmacy Consult for Lovenox Indication:  Allergies  Allergen Reactions   Nitrofurantoin Hives   Crestor [Rosuvastatin Calcium] Other (See Comments)    Generalized cramps   Bisacodyl Other (See Comments)    Makes patient feel like she is having cramps    Clarithromycin Hives and Other (See Comments)    Cluster migraines   Clindamycin Hcl Hives   Codeine Itching   Iron Sucrose Other (See Comments)    Flushing- required benadryl and solu medrol   Monosodium Glutamate Other (See Comments)    Cluster migraines    Scopolamine Hbr Other (See Comments)    Cluster migraines, impaired vision   Other Hives, Itching, Swelling and Rash    Cigarette smoke    Patient Measurements: Height: '5\' 4"'$  (162.6 cm) Weight: 75.8 kg (167 lb) IBW/kg (Calculated) : 54.7  Vital Signs: Temp: 98.8 F (37.1 C) (06/07 1103) Temp Source: Oral (06/07 1103) BP: 98/58 (06/07 1512) Pulse Rate: 87 (06/07 1512)  Labs: Recent Labs    06/03/22 1201  HGB 14.1  HCT 42.8  PLT 189  CREATININE 0.79    Estimated Creatinine Clearance: 74.5 mL/min (by C-G formula based on SCr of 0.79 mg/dL).   Medical History: Past Medical History:  Diagnosis Date   Abdominal hernia 08/14/2019   Abdominal pain 06/06/2019   Acute bronchitis 05/01/2013   Acute cystitis 06/09/2010   Qualifier: Diagnosis of  By: Cori Razor LPN, Brandi     Acute renal insufficiency 08/28/2011   Allergy    Phreesia 03/16/2021   Anemia    Phreesia 10/29/2020   Anxiety    Anxiety state 03/16/2008   Qualifier: Diagnosis of  By: Dierdre Harness     Arthritis    Phreesia 10/29/2020   Back pain with radiation 07/17/2014   Bilateral hand pain 07/27/2016   Blood transfusion without reported diagnosis    Phreesia 10/29/2020   Breast cancer ([place])    L breast- 2006   Cancer ([place])    Phreesia 10/29/2020   Carpal tunnel syndrome    Chronic constipation    Chronic pain syndrome    followed by [facility]---  back   Clotting disorder ([place])    Phreesia 10/29/2020   Depression    Dermatitis 12/15/2011   Difficult intubation    Educated about COVID-19 virus infection 05/14/2019   Fall at home 01/26/2016   Family history of adverse reaction to anesthesia    MOTHER--- PONV   Fatigue 09/17/2012   Fibromyalgia    FIBROMYALGIA 03/16/2008   Qualifier: Diagnosis of  By: Dierdre Harness     GERD (gastroesophageal reflux disease)    Headache disorder 01/16/2013   Headache syndrome 01/26/2016   Hip pain, right 07/08/2019   History of MRSA infection    lip abscess   History of ovarian cyst 06/2011   s/p  BSO   History of pulmonary embolus (PE) 1997   post EP with ablation pulmonary veouns for SVT/ Atrial Fib.   History of supraventricular tachycardia    s/p  ablation 1996  and 1997  by dr Caryl Comes   History of TIA (transient ischemic attack) 1997   post op EP ablation PE   Hyperlipidemia    Hyperlipidemia LDL goal <100 03/16/2008   Qualifier: Diagnosis of  By: Dierdre Harness     Hypothyroidism    followed by pcp   Incisional hernia    Insomnia 12/15/2011   Intermittent palpitations 08/22/2017   Interstitial cystitis  09-13-2018   per pt last flare-up  May 2019 (followed by pcp)   INTERSTITIAL CYSTITIS 02/17/2011   Qualifier: Diagnosis of  By: Moshe Cipro MD, Margaret     Iron deficiency anemia    09-13-2018  PER PT STABLE   Irregular heart rate 07/25/2013   Lipoma of back    upper   Low back pain radiating to right leg 07/04/2019   Low ferritin level 06/14/2014   MDD (major depressive disorder), single episode, in full remission ([place]) 10/27/2017   Medically noncompliant 02/28/2015   Multiple missed appointments, both follow-up appointments and lab appointments.    Metabolic syndrome X 01/15/4173   Qualifier: Diagnosis of  By: Moshe Cipro MD, Margaret  hBA1c is 5.8 in 02/2013    Migraines    Morbid obesity ([place]) 03/27/2013   Muscle spasm 01/03/2020   Nausea alone 07/17/2014   NECK PAIN, CHRONIC  03/16/2008   Qualifier: Diagnosis of  By: Dierdre Harness     Normal coronary arteries    a. by CT 12/2018.   Obesity 03/16/2008   Qualifier: Diagnosis of  By: Dierdre Harness     Oral ulceration 08/23/2014   Presented at 06/14/2014 visit    Other malaise and fatigue 03/16/2008   Centricity Description: FATIGUE, CHRONIC Qualifier: Diagnosis of  By: Dierdre Harness   Centricity Description: FATIGUE Qualifier: Diagnosis of  By: Claybon Jabs PA, Dawn     OVARIAN CYST 12/26/2009   Qualifier: Diagnosis of  By: Moshe Cipro MD, Margaret     Paget disease of breast, left [facility])    Paget's disease of breast, left ([place]) 03/16/2008   Qualifier: Diagnosis of  By: Dierdre Harness  Left diagnosed in 2006 F/h breast cancer x 15 family members   Partial small bowel obstruction ([place]) 07/04/2012   PONV (postoperative nausea and vomiting)    SEVERE   Postsurgical menopause 11/13/2011   Presence of IVC filter 06/06/2019   PSVT (paroxysmal supraventricular tachycardia) ([place])    [place]   Recurrent oral herpes simplex infection 10/13/2017   ROM (right otitis media) 01/23/2013   S/P insertion of IVC (inferior vena caval) filter 05/08/2005   [place] (non-retrievable)  /  dx 2019  a leg of filter is protruding thru the vena cava in to right L2 vertebral body (09-13-2018  per pt having surgery to remove filter in [place])   S/P radiofrequency ablation operation for arrhythmia 1996   1996  and 1997,   SVT and Atrial Fib   Sinusitis, chronic 01/26/2016   SMALL BOWEL OBSTRUCTION, HX OF 08/07/2008   Annotation: obstruction w/ adhesions led to partial colectomy Qualifier: Diagnosis of  By: Craige Cotta     Stroke [facility])    [place] 10/29/2020, TIAs in the past   Swelling of hand 09/17/2012   Syncope    Tachycardia 02/03/2010   Qualifier: Diagnosis of  By: Via LPN, Lynn     Thyroid disease    Phreesia 10/29/2020   Ulcer 12/15/2011   Urinary frequency 07/18/2014   Vaginitis and vulvovaginitis 05/10/2013   Vitamin D  deficiency 05/05/2015   Wears glasses     Medications:  (Not in a hospital admission)   Assessment: Asked to dose Lovenox for PE, Pt cannot take Xarelto  Goal of Therapy:  Anti-Xa level 0.6-1 units/ml 4hrs after LMWH dose given Monitor platelets by anticoagulation protocol: Yes   Plan:  Lovenox '1mg'$ /Kg ('80mg'$ ) SQ q12hrs Monitor CBC, s/sx bleeding complications  Hart Robinsons A 06/03/2022,5:17 PM

## 2022-06-03 NOTE — ED Triage Notes (Signed)
Pt to the ED with abdominal pain with N/V that began last night. Pt states she has had bowel obstruction and gastroparesis in the past.  Pt states she is vomiting food that she ate 2 weeks ago.

## 2022-06-03 NOTE — ED Provider Notes (Signed)
[facility] EMERGENCY DEPARTMENT Provider Note   CSN: [id number] Arrival date & time: 06/03/22  1046     History  Chief Complaint  Patient presents with   Abdominal Pain    [name] is a 61 y.o. female.   Abdominal Pain Associated symptoms: diarrhea, nausea and vomiting   Associated symptoms: no chest pain, no chills, no dysuria, no fever and no shortness of breath        [name] is a 61 y.o. female with an extensive past medical history including GERD PSVT, prior bowel obstruction, TIAs fibromyalgia chronic pain syndrome anemia and thyroid disease (anticoagulated with Xarelto) who presents to the Emergency Department complaining of abdominal pain, nausea,vomiting and diarrhea beginning last night.  She reports multiple episodes of vomiting and watery stools since last evening.  Unable to tolerate any fluids or liquids.  States she feels dehydrated.  Has pain over her entire abdomen but worse pain to right lower quadrant.  Abdominal pain is radiating to her back as well.  States pain feels crampy "like I am ovulating."  Pain has been associated with sweating last evening.  No black or bloody stools.  Saw some blood in her vomitus, but also attributes to forceful vomiting and dry heaves.  Believes her current symptoms are a flare of her gastroparesis.  She is followed by local GI and has been referred to GI in [place] but is awaiting appointment.  Denies chest pain or shortness of breath, fever or chills.  No flank pain or dysuria.  She is concerned that she may have another bowel obstruction   Home Medications Prior to Admission medications   Medication Sig Start Date End Date Taking? Authorizing Provider  ALPRAZolam [name]) 1 MG tablet Take 1 tablet (1 mg total) by mouth 2 (two) times daily. 04/29/22   Fayrene Helper, MD  ALPRAZolam [name]) 1 MG tablet Take 1 tablet (1 mg total) by mouth 2 (two) times daily. 06/02/22   Fayrene Helper, MD  aspirin 81 MG  chewable tablet Chew 81 mg by mouth daily.    [provider]  BIOTIN PO Take 1 capsule by mouth daily.    [provider]  butalbital-acetaminophen-caffeine (FIORICET) 50-325-40 MG tablet Take 1 tablet by mouth every 6 (six) hours as needed. 04/11/22   [provider]  conjugated estrogens (PREMARIN) vaginal cream Place 1 Applicatorful (1 application. total) vaginally daily. 05/18/22   Fayrene Helper, MD  cyanocobalamin (,VITAMIN B-12,) 1000 MCG/ML injection Inject 1 mL (1,000 mcg total) into the muscle every 30 (thirty) days. 09/18/21   Harriett Rush, PA-C  cyclobenzaprine (FLEXERIL) 10 MG tablet TAKE 1 TABLET BY MOUTH TWICE DAILY AS NEEDED FOR MUSCLE SPASMS Patient taking differently: Take 10 mg by mouth 2 (two) times daily. 02/21/20   Fayrene Helper, MD  cycloSPORINE (RESTASIS) 0.05 % ophthalmic emulsion Place 1 drop into both eyes 2 (two) times daily as needed (dry eyes).    [provider]  DULoxetine (CYMBALTA) 60 MG capsule Take 60 mg by mouth daily. 02/09/22   [provider]  erythromycin (ERY-TAB) 250 MG EC tablet TAKE 1 TABLET BY MOUTH THREE TIMES DAILY BEFORE MEALS Patient taking differently: 250 mg every evening. 04/06/22   Harvel Quale, MD  ezetimibe (ZETIA) 10 MG tablet Take 1 tablet (10 mg total) by mouth daily. Patient taking differently: Take 10 mg by mouth at bedtime. 09/28/21   Fayrene Helper, MD  fluticasone (FLONASE) 50 MCG/ACT nasal  spray Place 2 sprays into both nostrils daily as needed for allergies. 12/05/21   Johnson, Clanford L, MD  HYDROmorphone (DILAUDID) 4 MG tablet Take 4 mg by mouth 3 (three) times daily.    [provider]  levothyroxine (SYNTHROID) 125 MCG tablet TAKE 1 TABLET(125 MCG) BY MOUTH DAILY BEFORE BREAKFAST Patient taking differently: Take 125 mcg by mouth daily before breakfast. 08/26/21   Fayrene Helper, MD  lubiprostone (AMITIZA) 24 MCG capsule TAKE 1 CAPSULE(24 MCG)  BY MOUTH TWICE DAILY WITH A MEAL 05/18/22   Carlan, Chelsea L, NP  magnesium oxide (MAG-OX) 400 (240 Mg) MG tablet Take 1 tablet (400 mg total) by mouth 2 (two) times daily. Please make overdue appt with Dr. Caryl Comes before anymore refills. Thank you 1st attempt 09/03/21   Deboraha Sprang, MD  metFORMIN (GLUCOPHAGE-XR) 500 MG 24 hr tablet Take 500 mg by mouth every evening. 01/15/22   [provider]  Multiple Vitamins-Minerals (ICAPS AREDS 2 PO) Take 1 capsule by mouth every evening.    [provider]  omeprazole (PRILOSEC) 40 MG capsule Take 1 capsule (40 mg total) by mouth in the morning and at bedtime. 11/18/21   Kathie Dike, MD  phenazopyridine (PYRIDIUM) 95 MG tablet Take 95 mg by mouth 3 (three) times daily as needed for pain.    [provider]  potassium chloride (KLOR-CON M) 10 MEQ tablet TAKE 1 TABLET BY MOUTH EVERY DAY 05/18/22   Fayrene Helper, MD  pravastatin (PRAVACHOL) 40 MG tablet TAKE 1 TABLET(40 MG) BY MOUTH DAILY Patient taking differently: Take 40 mg by mouth at bedtime. 01/08/22   Fayrene Helper, MD  sulfamethoxazole-trimethoprim (BACTRIM) 400-80 MG tablet Take 1 tablet by mouth 2 (two) times daily.    [provider]  SUPER B COMPLEX/C PO Take 1 capsule by mouth daily.    [provider]  XARELTO 20 MG TABS tablet TAKE 1 TABLET(20 MG) BY MOUTH DAILY 05/26/22   Fayrene Helper, MD      Allergies    Nitrofurantoin, Crestor [rosuvastatin calcium], Bisacodyl, Clarithromycin, Clindamycin hcl, Codeine, Iron sucrose, Monosodium glutamate, Scopolamine hbr, and Other    Review of Systems   Review of Systems  Constitutional:  Positive for appetite change and diaphoresis. Negative for chills and fever.  Respiratory:  Negative for shortness of breath.   Cardiovascular:  Negative for chest pain.  Gastrointestinal:  Positive for abdominal pain, diarrhea, nausea and vomiting. Negative for abdominal distention and blood in stool.   Genitourinary:  Negative for difficulty urinating and dysuria.  Musculoskeletal:  Negative for arthralgias.  Skin:  Negative for color change and wound.  Neurological:  Negative for dizziness, seizures, weakness and headaches.   Physical Exam Updated Vital Signs BP 96/77 (BP Location: Right Arm)   Pulse (!) 109   Temp 98.8 F (37.1 C) (Oral)   Resp 20   Ht '5\' 4"'$  (1.626 m)   Wt 75.8 kg   SpO2 99%   BMI 28.67 kg/m  Physical Exam Vitals and nursing note reviewed.  Constitutional:      Appearance: She is not toxic-appearing.     Comments: Patient is uncomfortable appearing  HENT:     Mouth/Throat:     Mouth: Mucous membranes are dry.  Cardiovascular:     Rate and Rhythm: Normal rate and regular rhythm.     Pulses: Normal pulses.  Pulmonary:     Effort: Pulmonary effort is normal.  Abdominal:     General: There is  no distension.     Palpations: Abdomen is soft. There is no mass.     Tenderness: There is abdominal tenderness. There is no right CVA tenderness, left CVA tenderness or guarding.     Comments: Diffuse tenderness of the mid to lower abdomen.  Tenderness greater at right lower quadrant.  Significant surgical scarring of the abdomen without erythema.  Musculoskeletal:     Right lower leg: No edema.     Left lower leg: No edema.  Skin:    General: Skin is warm.     Capillary Refill: Capillary refill takes less than 2 seconds.  Neurological:     General: No focal deficit present.     Mental Status: She is alert.     Sensory: No sensory deficit.     Motor: No weakness.    ED Results / Procedures / Treatments   Labs (all labs ordered are listed, but only abnormal results are displayed) Labs Reviewed  COMPREHENSIVE METABOLIC PANEL - Abnormal; Notable for the following components:      Result Value   Glucose, Bld 102 (*)    Calcium 8.7 (*)    AST 47 (*)    ALT 63 (*)    Alkaline Phosphatase 137 (*)    All other components within normal limits  CBC WITH  DIFFERENTIAL/PLATELET - Abnormal; Notable for the following components:   WBC 2.2 (*)    Neutro Abs 1.5 (*)    Lymphs Abs 0.4 (*)    All other components within normal limits  URINALYSIS, ROUTINE W REFLEX MICROSCOPIC - Abnormal; Notable for the following components:   Ketones, ur 20 (*)    All other components within normal limits  MAGNESIUM - Abnormal; Notable for the following components:   Magnesium 1.6 (*)    All other components within normal limits  LIPASE, BLOOD    EKG None  Radiology CT ABDOMEN PELVIS W CONTRAST  Result Date: 06/03/2022 CLINICAL DATA:  Right lower quadrant abdominal pain, nausea and vomiting since last night. History of multiple abdominal surgeries and bowel obstruction. EXAM: CT ABDOMEN AND PELVIS WITH CONTRAST TECHNIQUE: Multidetector CT imaging of the abdomen and pelvis was performed using the standard protocol following bolus administration of intravenous contrast. RADIATION DOSE REDUCTION: This exam was performed according to the departmental dose-optimization program which includes automated exposure control, adjustment of the mA and/or kV according to patient size and/or use of iterative reconstruction technique. CONTRAST:  120m OMNIPAQUE IOHEXOL 300 MG/ML  SOLN COMPARISON:  03/28/2022 CT abdomen/pelvis. FINDINGS: Lower chest: No acute abnormality at the lung bases. Partially visualized right breast prosthesis. Hepatobiliary: Normal liver size. No liver mass. Cholecystectomy. Bile ducts are stable and within normal post cholecystectomy limits with CBD diameter 8 mm. Pancreas: Normal, with no mass or duct dilation. Spleen: Normal size. No mass. Adrenals/Urinary Tract: Normal adrenals. Normal kidneys with no hydronephrosis and no renal mass. Normal collapsed bladder. Stomach/Bowel: Normal non-distended stomach. Redemonstrated multiple enteroenterostomy suture lines from prior small bowel resections in the central and right abdomen. Redemonstrated subtotal colectomy  with intact appearing entero-colonic anastomosis in the central lower abdomen. Chronic dilatation of multiple small bowel loops in the right abdomen up to 4.3 cm diameter, with associated scattered air-fluid levels, not substantially changed from 03/28/2022 CT. No small bowel wall thickening. Proximal small bowel loops are nondistended. Fluid throughout the remnant rectum and large bowel. No colorectal wall thickening. Vascular/Lymphatic: Normal caliber abdominal aorta. Patent portal, splenic, hepatic and renal veins. No pathologically enlarged lymph nodes in  the abdomen or pelvis. Reproductive: Status post hysterectomy, with no abnormal findings at the vaginal cuff. No adnexal mass. Other: No pneumoperitoneum, ascites or focal fluid collection. Musculoskeletal: No aggressive appearing focal osseous lesions. Mild thoracolumbar spondylosis. IMPRESSION: Chronic dilatation of multiple small bowel loops in the right abdomen with associated scattered air-fluid levels, not substantially changed from 03/28/2022 CT. Fluid throughout the remnant distal colon and rectum, suggesting a nonspecific malabsorptive state. A partial mechanical distal small bowel obstruction cannot be excluded, particularly given the absence of oral contrast on this scan. Findings may alternatively represent chronic postsurgical changes or adynamic ileus. No bowel wall thickening. Electronically Signed   By: Ilona Sorrel M.D.   On: 06/03/2022 14:23    Procedures Procedures    Medications Ordered in ED Medications  sodium chloride 0.9 % bolus 1,000 mL (0 mLs Intravenous Stopped 06/03/22 1301)    And  0.9 %  sodium chloride infusion ( Intravenous New Bag/Given 06/03/22 1402)  HYDROmorphone (DILAUDID) injection 1 mg (1 mg Intravenous Given 06/03/22 1219)  ondansetron (ZOFRAN) injection 4 mg (4 mg Intravenous Given 06/03/22 1219)  magnesium sulfate IVPB 2 g 50 mL (0 g Intravenous Stopped 06/03/22 1512)  iohexol (OMNIPAQUE) 300 MG/ML solution 100 mL  (100 mLs Intravenous Contrast Given 06/03/22 1348)    ED Course/ Medical Decision Making/ A&P                           Medical Decision Making Patient here with significant past medical history including multiple abdominal surgeries including small bowel resection and total colectomy reported gastroparesis, and prior bowel obstruction.  Here for abdominal pain, nausea vomiting and diarrhea that began last evening.  Unable to tolerate any food or liquids.  Believes she has flare of her gastroparesis or bowel obstruction.  On exam, patient is uncomfortable appearing but nontoxic.  No active vomiting during my exam.  Mucous membranes appear dry.  She is mildly tachycardic and blood pressures are soft with systolic of 96.  Symptoms include extensive work-up and high risk for complications and morbidity.  Her differential would include small bowel obstruction although this is less likely given her complaint of diarrhea, exacerbation of gastroparesis, diverticulitis viral illness.  Patient has had prior complete hysterectomy and appendectomy  Amount and/or Complexity of Data Reviewed Labs: ordered.    Details: Labs interpreted by me patient has leukopenia with white count of 2.2, hemoglobin reassuring at 14.1, chemistries reassuring, transaminases slightly elevated but near patient's baseline.  Lipase unremarkable.  Magnesium 1.6 urinalysis without evidence of infection. Radiology: ordered.    Details: CT abdomen pelvis shows chronic dilation of the small bowel loops with scattered air-fluid levels not substantially changed from 03/28/2022 a partial mechanical distal small bowel obstruction cannot be excluded.  Findings may alternatively represent chronic postsurgical changes or an adynamic ileus.  There is no bowel wall thickening. Discussion of management or test interpretation with external provider(s): Patient reports feeling somewhat better after IV fluids pain medication and antiemetic.  No further  vomiting after receiving antiemetic.  Discussed work-up and CT findings with general surgery, Dr. Constance Haw who recommends hospital admission with diet as tolerated and she will consult tomorrow.  Does not recommend NG tube or n.p.o. status at this time.  Discussed with [facility], Dr. Waldron Labs who agrees to admit           Final Clinical Impression(s) / ED Diagnoses Final diagnoses:  Partial small bowel obstruction ([place])  Rx / DC Orders ED Discharge Orders     None         Kem Parkinson, PA-C 06/03/22 1636    Sherwood Gambler, MD 06/04/22 1538

## 2022-06-04 DIAGNOSIS — R1084 Generalized abdominal pain: Secondary | ICD-10-CM | POA: Diagnosis not present

## 2022-06-04 DIAGNOSIS — D72819 Decreased white blood cell count, unspecified: Secondary | ICD-10-CM | POA: Diagnosis present

## 2022-06-04 DIAGNOSIS — Z8719 Personal history of other diseases of the digestive system: Secondary | ICD-10-CM | POA: Diagnosis not present

## 2022-06-04 DIAGNOSIS — R1031 Right lower quadrant pain: Secondary | ICD-10-CM | POA: Diagnosis present

## 2022-06-04 DIAGNOSIS — E538 Deficiency of other specified B group vitamins: Secondary | ICD-10-CM | POA: Diagnosis present

## 2022-06-04 DIAGNOSIS — K219 Gastro-esophageal reflux disease without esophagitis: Secondary | ICD-10-CM | POA: Diagnosis present

## 2022-06-04 DIAGNOSIS — Z86711 Personal history of pulmonary embolism: Secondary | ICD-10-CM | POA: Diagnosis not present

## 2022-06-04 DIAGNOSIS — F411 Generalized anxiety disorder: Secondary | ICD-10-CM | POA: Diagnosis present

## 2022-06-04 DIAGNOSIS — F1120 Opioid dependence, uncomplicated: Secondary | ICD-10-CM | POA: Diagnosis present

## 2022-06-04 DIAGNOSIS — M797 Fibromyalgia: Secondary | ICD-10-CM | POA: Diagnosis present

## 2022-06-04 DIAGNOSIS — A049 Bacterial intestinal infection, unspecified: Secondary | ICD-10-CM | POA: Diagnosis present

## 2022-06-04 DIAGNOSIS — I10 Essential (primary) hypertension: Secondary | ICD-10-CM | POA: Diagnosis present

## 2022-06-04 DIAGNOSIS — R634 Abnormal weight loss: Secondary | ICD-10-CM | POA: Diagnosis present

## 2022-06-04 DIAGNOSIS — D509 Iron deficiency anemia, unspecified: Secondary | ICD-10-CM | POA: Diagnosis present

## 2022-06-04 DIAGNOSIS — Z853 Personal history of malignant neoplasm of breast: Secondary | ICD-10-CM | POA: Diagnosis not present

## 2022-06-04 DIAGNOSIS — Z800 Family history of malignant neoplasm of digestive organs: Secondary | ICD-10-CM | POA: Diagnosis not present

## 2022-06-04 DIAGNOSIS — E039 Hypothyroidism, unspecified: Secondary | ICD-10-CM | POA: Diagnosis present

## 2022-06-04 DIAGNOSIS — K56600 Partial intestinal obstruction, unspecified as to cause: Secondary | ICD-10-CM | POA: Diagnosis not present

## 2022-06-04 DIAGNOSIS — E876 Hypokalemia: Secondary | ICD-10-CM | POA: Diagnosis present

## 2022-06-04 DIAGNOSIS — Z86718 Personal history of other venous thrombosis and embolism: Secondary | ICD-10-CM | POA: Diagnosis not present

## 2022-06-04 DIAGNOSIS — K3184 Gastroparesis: Secondary | ICD-10-CM | POA: Diagnosis present

## 2022-06-04 DIAGNOSIS — G894 Chronic pain syndrome: Secondary | ICD-10-CM | POA: Diagnosis present

## 2022-06-04 DIAGNOSIS — R109 Unspecified abdominal pain: Secondary | ICD-10-CM | POA: Diagnosis not present

## 2022-06-04 DIAGNOSIS — R1310 Dysphagia, unspecified: Secondary | ICD-10-CM | POA: Diagnosis present

## 2022-06-04 DIAGNOSIS — E785 Hyperlipidemia, unspecified: Secondary | ICD-10-CM | POA: Diagnosis present

## 2022-06-04 DIAGNOSIS — Z95828 Presence of other vascular implants and grafts: Secondary | ICD-10-CM | POA: Diagnosis not present

## 2022-06-04 DIAGNOSIS — A0832 Astrovirus enteritis: Secondary | ICD-10-CM | POA: Diagnosis present

## 2022-06-04 DIAGNOSIS — Z8619 Personal history of other infectious and parasitic diseases: Secondary | ICD-10-CM | POA: Diagnosis not present

## 2022-06-04 LAB — CBC
HCT: 40.4 % (ref 36.0–46.0)
Hemoglobin: 13.1 g/dL (ref 12.0–15.0)
MCH: 30.7 pg (ref 26.0–34.0)
MCHC: 32.4 g/dL (ref 30.0–36.0)
MCV: 94.6 fL (ref 80.0–100.0)
Platelets: 157 10*3/uL (ref 150–400)
RBC: 4.27 MIL/uL (ref 3.87–5.11)
RDW: 14.4 % (ref 11.5–15.5)
WBC: 2.7 10*3/uL — ABNORMAL LOW (ref 4.0–10.5)
nRBC: 0 % (ref 0.0–0.2)

## 2022-06-04 LAB — BASIC METABOLIC PANEL
Anion gap: 9 (ref 5–15)
BUN: 14 mg/dL (ref 6–20)
CO2: 23 mmol/L (ref 22–32)
Calcium: 8.3 mg/dL — ABNORMAL LOW (ref 8.9–10.3)
Chloride: 104 mmol/L (ref 98–111)
Creatinine, Ser: 0.67 mg/dL (ref 0.44–1.00)
GFR, Estimated: >60 mL/min (ref >60)
Glucose, Bld: 86 mg/dL (ref 70–99)
Potassium: 3.9 mmol/L (ref 3.5–5.1)
Sodium: 136 mmol/L (ref 135–145)

## 2022-06-04 LAB — MAGNESIUM: Magnesium: 1.9 mg/dL (ref 1.7–2.4)

## 2022-06-04 MED ORDER — OXYCODONE HCL 5 MG PO TABS
10.0000 mg | ORAL_TABLET | Freq: Once | ORAL | Status: AC
  Administered 2022-06-04: 10 mg via ORAL

## 2022-06-04 MED ORDER — OXYCODONE HCL 5 MG PO TABS
ORAL_TABLET | ORAL | Status: AC
  Filled 2022-06-04: qty 2

## 2022-06-04 MED ORDER — PANTOPRAZOLE SODIUM 40 MG IV SOLR
40.0000 mg | Freq: Two times a day (BID) | INTRAVENOUS | Status: DC
  Administered 2022-06-04 – 2022-06-08 (×8): 40 mg via INTRAVENOUS
  Filled 2022-06-04 (×8): qty 10

## 2022-06-04 MED ORDER — PROCHLORPERAZINE EDISYLATE 10 MG/2ML IJ SOLN
10.0000 mg | Freq: Once | INTRAMUSCULAR | Status: AC
  Administered 2022-06-04: 10 mg via INTRAVENOUS
  Filled 2022-06-04: qty 2

## 2022-06-04 NOTE — Progress Notes (Signed)
  Transition of Care [facility]) Screening Note   Patient Details  Name: Tamico Mundo Date of Birth: [birth year]   Transition of [facility]) CM/SW Contact:    Iona Beard, [place] Phone Number: 06/04/2022, 11:28 AM    Transition of Care Department [facility]) has reviewed patient and no TOC needs have been identified at this time. We will continue to monitor patient advancement through interdisciplinary progression rounds. If new patient transition needs arise, please place a TOC consult.

## 2022-06-04 NOTE — Consult Note (Signed)
[place]  Reason for Consult: pSBO, diarrhea  Referring Physician: Dr. Roderic Palau   Chief Complaint   Abdominal Pain     HPI: [name] is a 61 y.o. female with complaints of nausea, vomiting, inability to take in po and diarrhea. She has had several abdominal operations in the past that all started with a resection of an ovary cyst/ tumor that was thought to be cancer at [facility] and subsequent development of enterocutaneous fistulas. She reports having to have multiple surgeries at [facility] to get all of these closed. She now only has 6 inches of colon and some small bowel. She says that she also had been diagnosed with gastroparesis recently, and that this pain was in the RLQ and she has not been able to eat anything until taking in some clears today.   Past Medical History:  Diagnosis Date   Abdominal hernia 08/14/2019   Abdominal pain 06/06/2019   Acute bronchitis 05/01/2013   Acute cystitis 06/09/2010   Qualifier: Diagnosis of  By: Cori Razor LPN, Brandi     Acute renal insufficiency 08/28/2011   Allergy    Phreesia 03/16/2021   Anemia    Phreesia 10/29/2020   Anxiety    Anxiety state 03/16/2008   Qualifier: Diagnosis of  By: Dierdre Harness     Arthritis    Phreesia 10/29/2020   Back pain with radiation 07/17/2014   Bilateral hand pain 07/27/2016   Blood transfusion without reported diagnosis    Phreesia 10/29/2020   Breast cancer ([place])    L breast- 2006   Cancer ([place])    Phreesia 10/29/2020   Carpal tunnel syndrome    Chronic constipation    Chronic pain syndrome    followed by [facility]---  back   Clotting disorder ([place])    Phreesia 10/29/2020   Depression    Dermatitis 12/15/2011   Difficult intubation    Educated about COVID-19 virus infection 05/14/2019   Fall at home 01/26/2016   Family history of adverse reaction to anesthesia    MOTHER--- PONV   Fatigue 09/17/2012   Fibromyalgia    FIBROMYALGIA 03/16/2008   Qualifier: Diagnosis  of  By: Dierdre Harness     GERD (gastroesophageal reflux disease)    Headache disorder 01/16/2013   Headache syndrome 01/26/2016   Hip pain, right 07/08/2019   History of MRSA infection    lip abscess   History of ovarian cyst 06/2011   s/p  BSO   History of pulmonary embolus (PE) 1997   post EP with ablation pulmonary veouns for SVT/ Atrial Fib.   History of supraventricular tachycardia    s/p  ablation 1996  and 1997  by dr Caryl Comes   History of TIA (transient ischemic attack) 1997   post op EP ablation PE   Hyperlipidemia    Hyperlipidemia LDL goal <100 03/16/2008   Qualifier: Diagnosis of  By: Dierdre Harness     Hypothyroidism    followed by pcp   Incisional hernia    Insomnia 12/15/2011   Intermittent palpitations 08/22/2017   Interstitial cystitis    09-13-2018   per pt last flare-up  May 2019 (followed by pcp)   INTERSTITIAL CYSTITIS 02/17/2011   Qualifier: Diagnosis of  By: Moshe Cipro MD, Margaret     Iron deficiency anemia    09-13-2018  PER PT STABLE   Irregular heart rate 07/25/2013   Lipoma of back    upper   Low back pain radiating to right  leg 07/04/2019   Low ferritin level 06/14/2014   MDD (major depressive disorder), single episode, in full remission ([place]) 10/27/2017   Medically noncompliant 02/28/2015   Multiple missed appointments, both follow-up appointments and lab appointments.    Metabolic syndrome X 0/25/4270   Qualifier: Diagnosis of  By: Moshe Cipro MD, Margaret  hBA1c is 5.8 in 02/2013    Migraines    Morbid obesity ([place]) 03/27/2013   Muscle spasm 01/03/2020   Nausea alone 07/17/2014   NECK PAIN, CHRONIC 03/16/2008   Qualifier: Diagnosis of  By: Dierdre Harness     Normal coronary arteries    a. by CT 12/2018.   Obesity 03/16/2008   Qualifier: Diagnosis of  By: Dierdre Harness     Oral ulceration 08/23/2014   Presented at 06/14/2014 visit    Other malaise and fatigue 03/16/2008   Centricity Description: FATIGUE, CHRONIC Qualifier: Diagnosis of  By: Dierdre Harness    Centricity Description: FATIGUE Qualifier: Diagnosis of  By: Claybon Jabs PA, Dawn     OVARIAN CYST 12/26/2009   Qualifier: Diagnosis of  By: Moshe Cipro MD, Margaret     Paget disease of breast, left [facility])    Paget's disease of breast, left ([place]) 03/16/2008   Qualifier: Diagnosis of  By: Dierdre Harness  Left diagnosed in 2006 F/h breast cancer x 15 family members   Partial small bowel obstruction ([place]) 07/04/2012   PONV (postoperative nausea and vomiting)    SEVERE   Postsurgical menopause 11/13/2011   Presence of IVC filter 06/06/2019   PSVT (paroxysmal supraventricular tachycardia) ([place])    [place]   Recurrent oral herpes simplex infection 10/13/2017   ROM (right otitis media) 01/23/2013   S/P insertion of IVC (inferior vena caval) filter 05/08/2005   [place] (non-retrievable)  /  dx 2019  a leg of filter is protruding thru the vena cava in to right L2 vertebral body (09-13-2018  per pt having surgery to remove filter in [place])   S/P radiofrequency ablation operation for arrhythmia 1996   1996  and 1997,   SVT and Atrial Fib   Sinusitis, chronic 01/26/2016   SMALL BOWEL OBSTRUCTION, HX OF 08/07/2008   Annotation: obstruction w/ adhesions led to partial colectomy Qualifier: Diagnosis of  By: Craige Cotta     Stroke [facility])    [place] 10/29/2020, TIAs in the past   Swelling of hand 09/17/2012   Syncope    Tachycardia 02/03/2010   Qualifier: Diagnosis of  By: Via LPN, Lynn     Thyroid disease    Phreesia 10/29/2020   Ulcer 12/15/2011   Urinary frequency 07/18/2014   Vaginitis and vulvovaginitis 05/10/2013   Vitamin D deficiency 05/05/2015   Wears glasses     Past Surgical History:  Procedure Laterality Date   ABDOMINAL HYSTERECTOMY  1987   ANTERIOR CERVICAL DECOMP/DISCECTOMY FUSION  03-07-2002   dr elsner  '@[facility]'$    C 4 -- 5   APPENDECTOMY  1980   AUGMENTATION MAMMAPLASTY Right 2006   BILATERAL SALPINGOOPHORECTOMY  07/24/2011   via [facility]or. Lap. w/ intraoperative perf. bowel  repair   BIOPSY  05/02/2021   Procedure: BIOPSY;  Surgeon: Montez Morita, Quillian Quince, MD;  [facility];  Service: Gastroenterology;;  small bowel, mid esophagus, distal esophagus, random colon biopsies   BIOPSY  12/04/2021   Procedure: BIOPSY;  Surgeon: [name], DO;  [facility];  Service: Endoscopy;;   BIOPSY  03/31/2022   Procedure: BIOPSY;  Surgeon: Harvel Quale, MD;  [facility];  Service: Gastroenterology;;   BOTOX INJECTION N/A 03/31/2022   Procedure: BOTOX INJECTION;  Surgeon: Harvel Quale, MD;  [facility];  Service: Gastroenterology;  Laterality: N/A;   BREAST BIOPSY Right 2019   benign   BREAST ENHANCEMENT SURGERY Bilateral 1993   BREAST IMPLANT REMOVAL Bilateral    BREAST SURGERY N/A    Phreesia 10/29/2020   CARDIAC CATHETERIZATION  07-06-2003   dr Darnell Level brodie   normal coronaries and LVF   CARDIAC ELECTROPHYSIOLOGY STUDY AND ABLATION  1996  and 1997   CARDIOVASCULAR STRESS TEST  11-18-2015   dr Caryl Comes   normal nuclear study w/ no ischemia/  normal LV function and wall motion , ef 84%   CARPAL TUNNEL RELEASE Right ?   CESAREAN SECTION  1986   CESAREAN SECTION N/A    Phreesia 10/29/2020   CHOLECYSTECTOMY N/A    Phreesia 10/29/2020   COLON SURGERY     COLONOSCOPY WITH PROPOFOL N/A 05/02/2021   Procedure: COLONOSCOPY WITH PROPOFOL;  Surgeon: Harvel Quale, MD;  [facility];  Service: Gastroenterology;  Laterality: N/A;  12:30 PM   CYSTO/  HYDRODISTENTION/  INSTILATION THERAPY  MULTIPLE   ENTEROCUTANEOUS FISTULA CLOSURE  multiple   last one 2015 with small bowel resection   ESOPHAGOGASTRODUODENOSCOPY (EGD) WITH PROPOFOL N/A 05/02/2021   Procedure: ESOPHAGOGASTRODUODENOSCOPY (EGD) WITH PROPOFOL;  Surgeon: Harvel Quale, MD;  [facility];  Service: Gastroenterology;  Laterality: N/A;   ESOPHAGOGASTRODUODENOSCOPY (EGD) WITH PROPOFOL N/A 12/04/2021   Procedure:  ESOPHAGOGASTRODUODENOSCOPY (EGD) WITH PROPOFOL;  Surgeon: [name], DO;  [facility];  Service: Endoscopy;  Laterality: N/A;   ESOPHAGOGASTRODUODENOSCOPY (EGD) WITH PROPOFOL N/A 03/31/2022   Procedure: ESOPHAGOGASTRODUODENOSCOPY (EGD) WITH PROPOFOL;  Surgeon: Harvel Quale, MD;  [facility];  Service: Gastroenterology;  Laterality: N/A;  [place] / RESECTION SMALL BOWEL  11-09-2014   '@[facility]'$    FRACTURE SURGERY N/A    Phreesia 10/29/2020   JOINT REPLACEMENT N/A    Phreesia 10/29/2020   LIPOMA EXCISION Right 09/16/2018   Procedure: EXCISION LIPOMA UPPER BACK;  Surgeon: Clovis Riley, MD;  Location: [place];  Service: General;  Laterality: Right;   MASTECTOMY Left 2006   w/ reconstruction on left (paget's disease)  and right breast augmentation   POLYPECTOMY  05/02/2021   Procedure: POLYPECTOMY;  Surgeon: Harvel Quale, MD;  [facility];  Service: Gastroenterology;;  gastric   POLYPECTOMY  03/31/2022   Procedure: POLYPECTOMY;  Surgeon: Harvel Quale, MD;  [facility];  Service: Gastroenterology;;   Azzie Almas DILATION  05/02/2021   Procedure: Azzie Almas DILATION;  Surgeon: Harvel Quale, MD;  [facility];  Service: Gastroenterology;;   SMALL INTESTINE SURGERY N/A    Phreesia 10/29/2020   SPINE SURGERY N/A    Phreesia 10/29/2020   TOTAL COLECTOMY  08-04-2002    '@[facility]'$    AND CHOLECYSTECTOMY  (colonic inertia)   TRANSTHORACIC ECHOCARDIOGRAM  02/04/2016   ef 60-65%,  grade 2 diastolic dysfunction/  mild MR   TUBAL LIGATION N/A    Phreesia 03/16/2021   VENA CAVA FILTER PLACEMENT  05/08/2005   '@[facility]'$    [place] (non-retrievable)   WIDE EXCISION PERIRECTAL ABSCESSES  09-22-2005   @ [facility]    Family History  Problem Relation Age of Onset   Diabetes Mother    Heart disease Mother    Hypertension Mother  Heart disease  Father    Hyperlipidemia Father    Hypertension Father    Alcohol abuse Father    Colon cancer Maternal Aunt    Breast cancer Maternal Aunt    Cancer Maternal Uncle        mets   Bone cancer Maternal Grandfather        mets   Ovarian cancer Cousin 19   Breast cancer Cousin    Prostate cancer Maternal Uncle    Breast cancer Maternal Aunt    Brain cancer Maternal Aunt    Breast cancer Maternal Aunt     Social History   Tobacco Use   Smoking status: Never   Smokeless tobacco: Never  Vaping Use   Vaping Use: Never used  Substance Use Topics   Alcohol use: No    Comment: twice per year   Drug use: No    Medications: I have reviewed the patient's current medications. Prior to Admission:  Medications Prior to Admission  Medication Sig Dispense Refill Last Dose   ALPRAZolam (XANAX) 1 MG tablet Take 1 tablet (1 mg total) by mouth 2 (two) times daily. 60 tablet 0 06/02/2022   aspirin 81 MG chewable tablet Chew 81 mg by mouth daily.   06/02/2022   BIOTIN PO Take 1 capsule by mouth daily.   Past Month   butalbital-acetaminophen-caffeine (FIORICET) 50-325-40 MG tablet Take 1 tablet by mouth every 6 (six) hours as needed.   unknown   conjugated estrogens (PREMARIN) vaginal cream Place 1 Applicatorful (1 application. total) vaginally daily. 42.5 g 0 Past Week   cyanocobalamin (,VITAMIN B-12,) 1000 MCG/ML injection Inject 1 mL (1,000 mcg total) into the muscle every 30 (thirty) days. 1 mL 11 unknown   cyclobenzaprine (FLEXERIL) 10 MG tablet TAKE 1 TABLET BY MOUTH TWICE DAILY AS NEEDED FOR MUSCLE SPASMS (Patient taking differently: Take 10 mg by mouth 2 (two) times daily.) 180 tablet 0 06/02/2022   cycloSPORINE (RESTASIS) 0.05 % ophthalmic emulsion Place 1 drop into both eyes 2 (two) times daily as needed (dry eyes).   06/02/2022   DULoxetine (CYMBALTA) 60 MG capsule Take 60 mg by mouth daily.   06/02/2022   erythromycin (ERY-TAB) 250 MG EC tablet TAKE 1 TABLET BY MOUTH THREE TIMES DAILY BEFORE  MEALS (Patient taking differently: 250 mg every evening.) 90 tablet 2 Past Week   ezetimibe (ZETIA) 10 MG tablet Take 1 tablet (10 mg total) by mouth daily. (Patient taking differently: Take 10 mg by mouth at bedtime.) 90 tablet 3 Past Week   fluticasone (FLONASE) 50 MCG/ACT nasal spray Place 2 sprays into both nostrils daily as needed for allergies.   unknown   HYDROmorphone (DILAUDID) 4 MG tablet Take 4 mg by mouth 3 (three) times daily.   06/02/2022   levothyroxine (SYNTHROID) 125 MCG tablet TAKE 1 TABLET(125 MCG) BY MOUTH DAILY BEFORE BREAKFAST (Patient taking differently: Take 125 mcg by mouth daily before breakfast.) 90 tablet 1 06/02/2022   lubiprostone (AMITIZA) 24 MCG capsule TAKE 1 CAPSULE(24 MCG) BY MOUTH TWICE DAILY WITH A MEAL 60 capsule 3 06/02/2022   magnesium oxide (MAG-OX) 400 (240 Mg) MG tablet Take 1 tablet (400 mg total) by mouth 2 (two) times daily. Please make overdue appt with Dr. Caryl Comes before anymore refills. Thank you 1st attempt 60 tablet 0 Past Week   meclizine (ANTIVERT) 25 MG tablet Take by mouth.   unknown   metFORMIN (GLUCOPHAGE-XR) 500 MG 24 hr tablet Take 500 mg by mouth every evening.  Past Month   Multiple Vitamins-Minerals (ICAPS AREDS 2 PO) Take 1 capsule by mouth every evening.   Past Week   omeprazole (PRILOSEC) 40 MG capsule Take 1 capsule (40 mg total) by mouth in the morning and at bedtime. 180 capsule 3 06/02/2022   potassium chloride (KLOR-CON M) 10 MEQ tablet TAKE 1 TABLET BY MOUTH EVERY DAY 90 tablet 0 Past Week   pravastatin (PRAVACHOL) 40 MG tablet TAKE 1 TABLET(40 MG) BY MOUTH DAILY (Patient taking differently: Take 40 mg by mouth at bedtime.) 90 tablet 3 Past Week   SUPER B COMPLEX/C PO Take 1 capsule by mouth daily.   Past Month   XARELTO 20 MG TABS tablet TAKE 1 TABLET(20 MG) BY MOUTH DAILY 30 tablet 1 06/02/2022   Scheduled:  ALPRAZolam  1 mg Oral BID   conjugated estrogens  1 Applicatorful Vaginal Daily   DULoxetine  60 mg Oral Daily   enoxaparin  (LOVENOX) injection  80 mg Subcutaneous Q12H   levothyroxine  125 mcg Oral QAC breakfast   pantoprazole (PROTONIX) IV  40 mg Intravenous Q12H   Continuous:  lactated ringers 100 mL/hr at 06/04/22 0406   ZHG:[id number], cycloSPORINE, fluticasone, HYDROmorphone (DILAUDID) injection  Allergies  Allergen Reactions   Nitrofurantoin Hives   Crestor [Rosuvastatin Calcium] Other (See Comments)    Generalized cramps   Bisacodyl Other (See Comments)    Makes patient feel like she is having cramps    Clarithromycin Hives and Other (See Comments)    Cluster migraines   Clindamycin Hcl Hives   Codeine Itching   Iron Sucrose Other (See Comments)    [place]- required benadryl and solu medrol   Monosodium Glutamate Other (See Comments)    Cluster migraines    Scopolamine Hbr Other (See Comments)    Cluster migraines, impaired vision   Other Hives, Itching, Swelling and Rash    Cigarette smoke     ROS:  A comprehensive review of systems was negative except for: Gastrointestinal: positive for abdominal pain, diarrhea, nausea, and vomiting  Blood pressure (!) 100/58, pulse 79, temperature 98.3 F (36.8 C), temperature source Oral, resp. rate 20, height '5\' 4"'$  (1.626 m), weight 75.8 kg, SpO2 93 %. Physical Exam Vitals reviewed.  Constitutional:      Appearance: She is well-developed.  HENT:     Head: Normocephalic.  Eyes:     Pupils: Pupils are equal, round, and reactive to light.  Cardiovascular:     Rate and Rhythm: Normal rate.  Pulmonary:     Effort: Pulmonary effort is normal.  Abdominal:     General: There is distension.     Palpations: Abdomen is soft.     Tenderness: There is abdominal tenderness in the right lower quadrant.  Skin:    General: Skin is warm.  Neurological:     General: No focal deficit present.     Mental Status: She is alert and oriented to person, place, and time.  Psychiatric:        Mood and Affect: Mood normal.      Results: Results for orders placed or performed during the hospital encounter of 06/03/22 (from the past 48 hour(s))  Comprehensive metabolic panel     Status: Abnormal   Collection Time: 06/03/22 12:01 PM  Result Value Ref Range   Sodium 139 135 - 145 mmol/L   Potassium 3.6 3.5 - 5.1 mmol/L   Chloride 103 98 - 111 mmol/L   CO2 28 22 - 32 mmol/L   Glucose, Bld  102 (H) 70 - 99 mg/dL    Comment: Glucose reference range applies only to samples taken after fasting for at least 8 hours.   BUN 18 6 - 20 mg/dL   Creatinine, Ser 0.79 0.44 - 1.00 mg/dL   Calcium 8.7 (L) 8.9 - 10.3 mg/dL   Total Protein 6.7 6.5 - 8.1 g/dL   Albumin 3.8 3.5 - 5.0 g/dL   AST 47 (H) 15 - 41 U/L   ALT 63 (H) 0 - 44 U/L   Alkaline Phosphatase 137 (H) 38 - 126 U/L   Total Bilirubin 0.8 0.3 - 1.2 mg/dL   GFR, Estimated >60 >60 mL/min    Comment: (NOTE) Calculated using the CKD-EPI Creatinine Equation (2021)    Anion gap 8 5 - 15    Comment: Performed at [facility], [address], [place], [place] [address]  Lipase, blood     Status: None   Collection Time: 06/03/22 12:01 PM  Result Value Ref Range   Lipase 21 11 - 51 U/L    Comment: Performed at [facility], [address], [place], [place] [address]  CBC with Diff     Status: Abnormal   Collection Time: 06/03/22 12:01 PM  Result Value Ref Range   WBC 2.2 (L) 4.0 - 10.5 K/uL   RBC 4.64 3.87 - 5.11 MIL/uL   Hemoglobin 14.1 12.0 - 15.0 g/dL   HCT 42.8 36.0 - 46.0 %   MCV 92.2 80.0 - 100.0 fL   MCH 30.4 26.0 - 34.0 pg   MCHC 32.9 30.0 - 36.0 g/dL   RDW 14.6 11.5 - 15.5 %   Platelets 189 150 - 400 K/uL   nRBC 0.0 0.0 - 0.2 %   Neutrophils Relative % 67 %   Neutro Abs 1.5 (L) 1.7 - 7.7 K/uL   Lymphocytes Relative 20 %   Lymphs Abs 0.4 (L) 0.7 - 4.0 K/uL   Monocytes Relative 11 %   Monocytes Absolute 0.2 0.1 - 1.0 K/uL   Eosinophils Relative 0 %   Eosinophils Absolute 0.0 0.0 - 0.5 K/uL   Basophils Relative 1 %   Basophils Absolute 0.0 0.0 -  0.1 K/uL   Immature Granulocytes 1 %   Abs Immature Granulocytes 0.01 0.00 - 0.07 K/uL    Comment: Performed at [facility], [address] [place], [place] [address]  Magnesium     Status: Abnormal   Collection Time: 06/03/22 12:01 PM  Result Value Ref Range   Magnesium 1.6 (L) 1.7 - 2.4 mg/dL    Comment: Performed at [facility], [address], [place], [place] [address]  Phosphorus     Status: None   Collection Time: 06/03/22 12:01 PM  Result Value Ref Range   Phosphorus 3.8 2.5 - 4.6 mg/dL    Comment: Performed at [facility], [address] [place], [place] [address]  Urinalysis, Routine w reflex microscopic Urine, Clean Catch     Status: Abnormal   Collection Time: 06/03/22  1:46 PM  Result Value Ref Range   Color, Urine YELLOW YELLOW   APPearance CLEAR CLEAR   Specific Gravity, Urine 1.015 1.005 - 1.030   pH 5.0 5.0 - 8.0   Glucose, UA NEGATIVE NEGATIVE mg/dL   Hgb urine dipstick NEGATIVE NEGATIVE   Bilirubin Urine NEGATIVE NEGATIVE   Ketones, ur 20 (A) NEGATIVE mg/dL   Protein, ur NEGATIVE NEGATIVE mg/dL   Nitrite NEGATIVE NEGATIVE   Leukocytes,Ua NEGATIVE NEGATIVE    Comment: Performed at [facility], [address], [place], [place] [address]  CBC     Status: Abnormal   Collection Time: 06/04/22  5:48 AM  Result Value Ref Range   WBC 2.7 (L) 4.0 - 10.5 K/uL   RBC 4.27 3.87 - 5.11 MIL/uL   Hemoglobin 13.1 12.0 - 15.0 g/dL   HCT 40.4 36.0 - 46.0 %   MCV 94.6 80.0 - 100.0 fL   MCH 30.7 26.0 - 34.0 pg   MCHC 32.4 30.0 - 36.0 g/dL   RDW 14.4 11.5 - 15.5 %   Platelets 157 150 - 400 K/uL   nRBC 0.0 0.0 - 0.2 %    Comment: Performed at [facility], [address] [place], [place] [address]  Basic metabolic panel     Status: Abnormal   Collection Time: 06/04/22 12:01 PM  Result Value Ref Range   Sodium 136 135 - 145 mmol/L   Potassium 3.9 3.5 - 5.1 mmol/L   Chloride 104 98 - 111 mmol/L   CO2 23 22 - 32 mmol/L   Glucose, Bld 86 70 - 99 mg/dL    Comment: Glucose  reference range applies only to samples taken after fasting for at least 8 hours.   BUN 14 6 - 20 mg/dL   Creatinine, Ser 0.67 0.44 - 1.00 mg/dL   Calcium 8.3 (L) 8.9 - 10.3 mg/dL   GFR, Estimated >60 >60 mL/min    Comment: (NOTE) Calculated using the CKD-EPI Creatinine Equation (2021)    Anion gap 9 5 - 15    Comment: Performed at [facility], [address], [place], [place] [address]  Magnesium     Status: None   Collection Time: 06/04/22 12:01 PM  Result Value Ref Range   Magnesium 1.9 1.7 - 2.4 mg/dL    Comment: Performed at [facility], [address], [place], [place] [address]   *Note: Due to a large number of results and/or encounters for the requested time period, some results have not been displayed. A complete set of results can be found in Results Review.   Personally reviewed and reviewed with radiology- dilated loops of small bowel chronically, multiple enteric anastomosis, all patent, fluid down to the rectum, no signs of obstruction  CT ABDOMEN PELVIS W CONTRAST  Result Date: 06/03/2022 CLINICAL DATA:  Right lower quadrant abdominal pain, nausea and vomiting since last night. History of multiple abdominal surgeries and bowel obstruction. EXAM: CT ABDOMEN AND PELVIS WITH CONTRAST TECHNIQUE: Multidetector CT imaging of the abdomen and pelvis was performed using the standard protocol following bolus administration of intravenous contrast. RADIATION DOSE REDUCTION: This exam was performed according to the departmental dose-optimization program which includes automated exposure control, adjustment of the mA and/or kV according to patient size and/or use of iterative reconstruction technique. CONTRAST:  19m OMNIPAQUE IOHEXOL 300 MG/ML  SOLN COMPARISON:  03/28/2022 CT abdomen/pelvis. FINDINGS: Lower chest: No acute abnormality at the lung bases. Partially visualized right breast prosthesis. Hepatobiliary: Normal liver size. No liver mass. Cholecystectomy. Bile ducts are stable and  within normal post cholecystectomy limits with CBD diameter 8 mm. Pancreas: Normal, with no mass or duct dilation. Spleen: Normal size. No mass. Adrenals/Urinary Tract: Normal adrenals. Normal kidneys with no hydronephrosis and no renal mass. Normal collapsed bladder. Stomach/Bowel: Normal non-distended stomach. Redemonstrated multiple enteroenterostomy suture lines from prior small bowel resections in the central and right abdomen. Redemonstrated subtotal colectomy with intact appearing entero-colonic anastomosis in the central lower abdomen. Chronic dilatation of multiple small bowel loops in the right  abdomen up to 4.3 cm diameter, with associated scattered air-fluid levels, not substantially changed from 03/28/2022 CT. No small bowel wall thickening. Proximal small bowel loops are nondistended. Fluid throughout the remnant rectum and large bowel. No colorectal wall thickening. Vascular/Lymphatic: Normal caliber abdominal aorta. Patent portal, splenic, hepatic and renal veins. No pathologically enlarged lymph nodes in the abdomen or pelvis. Reproductive: Status post hysterectomy, with no abnormal findings at the vaginal cuff. No adnexal mass. Other: No pneumoperitoneum, ascites or focal fluid collection. Musculoskeletal: No aggressive appearing focal osseous lesions. Mild thoracolumbar spondylosis. IMPRESSION: Chronic dilatation of multiple small bowel loops in the right abdomen with associated scattered air-fluid levels, not substantially changed from 03/28/2022 CT. Fluid throughout the remnant distal colon and rectum, suggesting a nonspecific malabsorptive state. A partial mechanical distal small bowel obstruction cannot be excluded, particularly given the absence of oral contrast on this scan. Findings may alternatively represent chronic postsurgical changes or adynamic ileus. No bowel wall thickening. Electronically Signed   By: Ilona Sorrel M.D.   On: 06/03/2022 14:23     Assessment & Plan:  Graysen Depaula is a 62 y.o. female with a complicated abdominal history, extensive history of EC fistulas, extensive scarring and anastomosis. No signs of SBO on CT and not clinically obstructed. Discussed with radiology if any repeat CT would give [place] more information with po or rectal contrast, but does not appear that this will give any additional information. She has been following with GI and is trying to get set up with [facility] again.    No acute surgical intervention indicated and guarded use of any surgery given her history and EC fistulas. If she did require surgery would recommend tertiary care level given the resources needed and risk.   GI to weigh. No clear explanation for new pain other than chronic issues.  I think she has multiple de-innervated areas of small bowel and does not work properly.  All questions were answered to the satisfaction of the patient.  Will follow along for now. Adv diet as tolerated.    Virl Cagey 06/04/2022, 3:54 PM

## 2022-06-04 NOTE — Progress Notes (Signed)
PROGRESS NOTE    [name]  QBH:[id number] DOB: 04-05-1961 DOA: 06/03/2022 PCP: Fayrene Helper, MD    Brief Narrative:  61 year old female with complicated abdominal history who has been seen at [facility] as well as [facility], presents to the emergency room with complaints of vomiting, abdominal pain as well as frequent stools.  CT of the abdomen indicated possible partial bowel obstruction.  She feels that her abdominal pain is different than her usual pain.  Seen by general surgery I did not feel to have an indication for surgery at this time.  GI also consulted.  Continues to have abdominal pain and loose stools at this time.   Assessment & Plan:   Principal Problem:   Abdominal pain Active Problems:   Hypothyroidism   Hyperlipidemia LDL goal <100   Iron deficiency anemia   Anxiety state   Fibromyalgia   Hx SBO   Chronic pain syndrome   GERD (gastroesophageal reflux disease)   B12 deficiency   Essential hypertension   History of DVT (deep vein thrombosis)   Partial small bowel obstruction (HCC)   Nausea, vomiting, abdominal pain, partial SBO -CT findings indicated partial SBO, although this was a noncontrast study -Seen by general surgery and reviewed by radiology -Patient having frequent bowel movements at this point, unlikely to have a true obstruction -Started on liquid diet -Since she is not having frequent stools, will hold further Amitiza -Discussed with Dr. Constance Haw, will ask GI to weigh in  Hypomagnesemia -Replaced  Chronic pain/fibromyalgia/opiate dependence -Chronically on Dilaudid -She also takes Cymbalta  History of DVT/PE -Chronically on Xarelto -Currently on full dose Lovenox  Hypothyroidism -Continue Synthroid  Anxiety -Continue on Xanax   DVT prophylaxis:   Therapeutic Lovenox  Code Status: Full code Family Communication: None present Disposition Plan: Status is: Inpatient Remains inpatient appropriate because:  Continued abdominal pain and frequent loose stools     Consultants:  General surgery  Procedures:    Antimicrobials:      Subjective: Continues to have abdominal pain.  No vomiting, but still has nausea.  Reports frequent bowel movements  Objective: Vitals:   06/03/22 2256 06/04/22 0209 06/04/22 0539 06/04/22 1300  BP: (!) 89/53 (!) 90/55 (!) 86/52 (!) 100/58  Pulse: 93 85 82 79  Resp: '18 18 19 20  '$ Temp: 98.8 F (37.1 C) 98.2 F (36.8 C) 98.5 F (36.9 C) 98.3 F (36.8 C)  TempSrc: Oral Oral Oral Oral  SpO2: 95% 94% 95% 93%  Weight:      Height:        Intake/Output Summary (Last 24 hours) at 06/04/2022 1945 Last data filed at 06/04/2022 1700 Gross per 24 hour  Intake 1694.19 ml  Output --  Net 1694.19 ml   Filed Weights   06/03/22 1103 06/03/22 1104  Weight: 78.2 kg 75.8 kg    Examination:  General exam: Appears calm and comfortable  Respiratory system: Clear to auscultation. Respiratory effort normal. Cardiovascular system: S1 & S2 heard, RRR. No JVD, murmurs, rubs, gallops or clicks. No pedal edema. Gastrointestinal system: Abdomen is nondistended, soft and tender in the right lower quadrant. No organomegaly or masses felt. Normal bowel sounds heard. Central nervous system: Alert and oriented. No focal neurological deficits. Extremities: Symmetric 5 x 5 power. Skin: No rashes, lesions or ulcers Psychiatry: Judgement and insight appear normal. Mood & affect appropriate.     Data Reviewed: I have personally reviewed following labs and imaging studies  CBC: Recent Labs  Lab 06/03/22  1201 06/04/22 0548  WBC 2.2* 2.7*  NEUTROABS 1.5*  --   HGB 14.1 13.1  HCT 42.8 40.4  MCV 92.2 94.6  PLT 189 161   Basic Metabolic Panel: Recent Labs  Lab 06/03/22 1201 06/04/22 1201  NA 139 136  K 3.6 3.9  CL 103 104  CO2 28 23  GLUCOSE 102* 86  BUN 18 14  CREATININE 0.79 0.67  CALCIUM 8.7* 8.3*  MG 1.6* 1.9  PHOS 3.8  --    GFR: Estimated Creatinine  Clearance: 74.5 mL/min (by C-G formula based on SCr of 0.67 mg/dL). Liver Function Tests: Recent Labs  Lab 06/03/22 1201  AST 47*  ALT 63*  ALKPHOS 137*  BILITOT 0.8  PROT 6.7  ALBUMIN 3.8   Recent Labs  Lab 06/03/22 1201  LIPASE 21   No results for input(s): "AMMONIA" in the last 168 hours. Coagulation Profile: No results for input(s): "INR", "PROTIME" in the last 168 hours. Cardiac Enzymes: No results for input(s): "CKTOTAL", "CKMB", "CKMBINDEX", "TROPONINI" in the last 168 hours. BNP (last 3 results) No results for input(s): "PROBNP" in the last 8760 hours. HbA1C: No results for input(s): "HGBA1C" in the last 72 hours. CBG: No results for input(s): "GLUCAP" in the last 168 hours. Lipid Profile: No results for input(s): "CHOL", "HDL", "LDLCALC", "TRIG", "CHOLHDL", "LDLDIRECT" in the last 72 hours. Thyroid Function Tests: No results for input(s): "TSH", "T4TOTAL", "FREET4", "T3FREE", "THYROIDAB" in the last 72 hours. Anemia Panel: No results for input(s): "VITAMINB12", "FOLATE", "FERRITIN", "TIBC", "IRON", "RETICCTPCT" in the last 72 hours. Sepsis Labs: No results for input(s): "PROCALCITON", "LATICACIDVEN" in the last 168 hours.  No results found for this or any previous visit (from the past 240 hour(s)).       Radiology Studies: CT ABDOMEN PELVIS W CONTRAST  Result Date: 06/03/2022 CLINICAL DATA:  Right lower quadrant abdominal pain, nausea and vomiting since last night. History of multiple abdominal surgeries and bowel obstruction. EXAM: CT ABDOMEN AND PELVIS WITH CONTRAST TECHNIQUE: Multidetector CT imaging of the abdomen and pelvis was performed using the standard protocol following bolus administration of intravenous contrast. RADIATION DOSE REDUCTION: This exam was performed according to the departmental dose-optimization program which includes automated exposure control, adjustment of the mA and/or kV according to patient size and/or use of iterative  reconstruction technique. CONTRAST:  142m OMNIPAQUE IOHEXOL 300 MG/ML  SOLN COMPARISON:  03/28/2022 CT abdomen/pelvis. FINDINGS: Lower chest: No acute abnormality at the lung bases. Partially visualized right breast prosthesis. Hepatobiliary: Normal liver size. No liver mass. Cholecystectomy. Bile ducts are stable and within normal post cholecystectomy limits with CBD diameter 8 mm. Pancreas: Normal, with no mass or duct dilation. Spleen: Normal size. No mass. Adrenals/Urinary Tract: Normal adrenals. Normal kidneys with no hydronephrosis and no renal mass. Normal collapsed bladder. Stomach/Bowel: Normal non-distended stomach. Redemonstrated multiple enteroenterostomy suture lines from prior small bowel resections in the central and right abdomen. Redemonstrated subtotal colectomy with intact appearing entero-colonic anastomosis in the central lower abdomen. Chronic dilatation of multiple small bowel loops in the right abdomen up to 4.3 cm diameter, with associated scattered air-fluid levels, not substantially changed from 03/28/2022 CT. No small bowel wall thickening. Proximal small bowel loops are nondistended. Fluid throughout the remnant rectum and large bowel. No colorectal wall thickening. Vascular/Lymphatic: Normal caliber abdominal aorta. Patent portal, splenic, hepatic and renal veins. No pathologically enlarged lymph nodes in the abdomen or pelvis. Reproductive: Status post hysterectomy, with no abnormal findings at the vaginal cuff. No adnexal mass. Other: No  pneumoperitoneum, ascites or focal fluid collection. Musculoskeletal: No aggressive appearing focal osseous lesions. Mild thoracolumbar spondylosis. IMPRESSION: Chronic dilatation of multiple small bowel loops in the right abdomen with associated scattered air-fluid levels, not substantially changed from 03/28/2022 CT. Fluid throughout the remnant distal colon and rectum, suggesting a nonspecific malabsorptive state. A partial mechanical distal small  bowel obstruction cannot be excluded, particularly given the absence of oral contrast on this scan. Findings may alternatively represent chronic postsurgical changes or adynamic ileus. No bowel wall thickening. Electronically Signed   By: Ilona Sorrel M.D.   On: 06/03/2022 14:23        Scheduled Meds:  ALPRAZolam  1 mg Oral BID   conjugated estrogens  1 Applicatorful Vaginal Daily   DULoxetine  60 mg Oral Daily   enoxaparin (LOVENOX) injection  80 mg Subcutaneous Q12H   levothyroxine  125 mcg Oral QAC breakfast   pantoprazole (PROTONIX) IV  40 mg Intravenous Q12H   Continuous Infusions:  lactated ringers 100 mL/hr at 06/04/22 0406     LOS: 0 days    Time spent: 98mns    JKathie Dike MD [facility]   If 7PM-7AM, please contact night-coverage [email or web address]  06/04/2022, 7:45 PM

## 2022-06-05 DIAGNOSIS — D509 Iron deficiency anemia, unspecified: Secondary | ICD-10-CM

## 2022-06-05 DIAGNOSIS — F411 Generalized anxiety disorder: Secondary | ICD-10-CM | POA: Diagnosis not present

## 2022-06-05 DIAGNOSIS — Z8719 Personal history of other diseases of the digestive system: Secondary | ICD-10-CM

## 2022-06-05 DIAGNOSIS — G894 Chronic pain syndrome: Secondary | ICD-10-CM | POA: Diagnosis not present

## 2022-06-05 DIAGNOSIS — I10 Essential (primary) hypertension: Secondary | ICD-10-CM | POA: Diagnosis not present

## 2022-06-05 DIAGNOSIS — R1084 Generalized abdominal pain: Secondary | ICD-10-CM | POA: Diagnosis not present

## 2022-06-05 DIAGNOSIS — K219 Gastro-esophageal reflux disease without esophagitis: Secondary | ICD-10-CM

## 2022-06-05 LAB — C DIFFICILE QUICK SCREEN W PCR REFLEX
C Diff antigen: NEGATIVE
C Diff interpretation: NOT DETECTED
C Diff toxin: NEGATIVE

## 2022-06-05 MED ORDER — ASPIRIN 81 MG PO TBEC
81.0000 mg | DELAYED_RELEASE_TABLET | Freq: Every day | ORAL | Status: DC
  Administered 2022-06-05 – 2022-06-08 (×4): 81 mg via ORAL
  Filled 2022-06-05 (×4): qty 1

## 2022-06-05 MED ORDER — LIP MEDEX EX OINT: 1.0000 "application " | TOPICAL_OINTMENT | CUTANEOUS | Status: DC | PRN

## 2022-06-05 MED ORDER — ERYTHROMYCIN BASE 250 MG PO TABS
125.0000 mg | ORAL_TABLET | Freq: Three times a day (TID) | ORAL | Status: DC
  Administered 2022-06-06 – 2022-06-08 (×8): 125 mg via ORAL
  Filled 2022-06-05 (×8): qty 1

## 2022-06-05 MED ORDER — METRONIDAZOLE 500 MG PO TABS
500.0000 mg | ORAL_TABLET | Freq: Three times a day (TID) | ORAL | Status: DC
  Administered 2022-06-05 – 2022-06-08 (×9): 500 mg via ORAL
  Filled 2022-06-05 (×10): qty 1

## 2022-06-05 NOTE — Progress Notes (Signed)
PROGRESS NOTE    [name]  IHK:[id number] DOB: 1961/09/17 DOA: 06/03/2022 PCP: Fayrene Helper, MD    Brief Narrative:  61 year old female with complicated abdominal history who has been seen at [facility] as well as [facility], presents to the emergency room with complaints of vomiting, abdominal pain as well as frequent stools.  CT of the abdomen indicated possible partial bowel obstruction.  She feels that her abdominal pain is different than her usual pain.  Seen by general surgery I did not feel to have an indication for surgery at this time.  GI also consulted.  Continues to have abdominal pain and loose stools at this time.   Assessment & Plan:   Principal Problem:   Abdominal pain Active Problems:   Hypothyroidism   Hyperlipidemia LDL goal <100   Iron deficiency anemia   Anxiety state   Fibromyalgia   Hx SBO   Chronic pain syndrome   GERD (gastroesophageal reflux disease)   B12 deficiency   Essential hypertension   History of DVT (deep vein thrombosis)   Partial small bowel obstruction (HCC)   Nausea, vomiting, abdominal pain -Seen by general surgery and films reviewed with radiology.  It was felt that patient did not have signs of SBO on CT and was clinically not obstructed -She continues to have loose bowel movements -GI following -Checking stool studies for any underlying infectious process (C. difficile negative) -Also concerned that she may have some component of SIBO, started on course of Flagyl -She is continue on erythromycin for gastroparesis -Advancing diet as per GI   Hypomagnesemia -Replaced  Chronic pain/fibromyalgia/opiate dependence -Chronically on Dilaudid -She also takes Cymbalta  History of DVT/PE -Chronically on Xarelto -Currently on full dose Lovenox  Hypothyroidism -Continue Synthroid  Anxiety -Continue on Xanax   DVT prophylaxis:   Therapeutic Lovenox  Code Status: Full code Family Communication: Husband  at bedside Disposition Plan: Status is: Inpatient Remains inpatient appropriate because: Continued abdominal pain and frequent loose stools     Consultants:  General surgery Gastroenterology  Procedures:    Antimicrobials:      Subjective: She reports continued loose stools.  Continued abdominal pain.  She has nausea, but no vomiting  Objective: Vitals:   06/04/22 1300 06/04/22 2151 06/05/22 0351 06/05/22 1219  BP: (!) 100/58 91/60 (!) 90/58 96/63  Pulse: 79 79 74 74  Resp: '20 14 15 18  '$ Temp: 98.3 F (36.8 C) 98.5 F (36.9 C) 98.2 F (36.8 C) 98 F (36.7 C)  TempSrc: Oral Oral Oral Oral  SpO2: 93% 100% 94% 99%  Weight:      Height:        Intake/Output Summary (Last 24 hours) at 06/05/2022 1837 Last data filed at 06/05/2022 1300 Gross per 24 hour  Intake 3358.25 ml  Output --  Net 3358.25 ml   Filed Weights   06/03/22 1103 06/03/22 1104  Weight: 78.2 kg 75.8 kg    Examination:  General exam: Appears calm and comfortable  Respiratory system: Clear to auscultation. Respiratory effort normal. Cardiovascular system: S1 & S2 heard, RRR. No JVD, murmurs, rubs, gallops or clicks. No pedal edema. Gastrointestinal system: Abdomen is nondistended, soft and general tenderness. No organomegaly or masses felt. Normal bowel sounds heard. Central nervous system: Alert and oriented. No focal neurological deficits. Extremities: Symmetric 5 x 5 power. Skin: No rashes, lesions or ulcers Psychiatry: Judgement and insight appear normal. Mood & affect appropriate.     Data Reviewed: I have personally reviewed following  labs and imaging studies  CBC: Recent Labs  Lab 06/03/22 1201 06/04/22 0548  WBC 2.2* 2.7*  NEUTROABS 1.5*  --   HGB 14.1 13.1  HCT 42.8 40.4  MCV 92.2 94.6  PLT 189 696   Basic Metabolic Panel: Recent Labs  Lab 06/03/22 1201 06/04/22 1201  NA 139 136  K 3.6 3.9  CL 103 104  CO2 28 23  GLUCOSE 102* 86  BUN 18 14  CREATININE 0.79 0.67   CALCIUM 8.7* 8.3*  MG 1.6* 1.9  PHOS 3.8  --    GFR: Estimated Creatinine Clearance: 74.5 mL/min (by C-G formula based on SCr of 0.67 mg/dL). Liver Function Tests: Recent Labs  Lab 06/03/22 1201  AST 47*  ALT 63*  ALKPHOS 137*  BILITOT 0.8  PROT 6.7  ALBUMIN 3.8   Recent Labs  Lab 06/03/22 1201  LIPASE 21   No results for input(s): "AMMONIA" in the last 168 hours. Coagulation Profile: No results for input(s): "INR", "PROTIME" in the last 168 hours. Cardiac Enzymes: No results for input(s): "CKTOTAL", "CKMB", "CKMBINDEX", "TROPONINI" in the last 168 hours. BNP (last 3 results) No results for input(s): "PROBNP" in the last 8760 hours. HbA1C: No results for input(s): "HGBA1C" in the last 72 hours. CBG: No results for input(s): "GLUCAP" in the last 168 hours. Lipid Profile: No results for input(s): "CHOL", "HDL", "LDLCALC", "TRIG", "CHOLHDL", "LDLDIRECT" in the last 72 hours. Thyroid Function Tests: No results for input(s): "TSH", "T4TOTAL", "FREET4", "T3FREE", "THYROIDAB" in the last 72 hours. Anemia Panel: No results for input(s): "VITAMINB12", "FOLATE", "FERRITIN", "TIBC", "IRON", "RETICCTPCT" in the last 72 hours. Sepsis Labs: No results for input(s): "PROCALCITON", "LATICACIDVEN" in the last 168 hours.  Recent Results (from the past 240 hour(s))  C Difficile Quick Screen w PCR reflex     Status: None   Collection Time: 06/05/22 11:43 AM   Specimen: STOOL  Result Value Ref Range Status   C Diff antigen NEGATIVE NEGATIVE Final   C Diff toxin NEGATIVE NEGATIVE Final   C Diff interpretation No C. difficile detected.  Final    Comment: Performed at [facility], [address], [place], [place] [address]         Radiology Studies: No results found.      Scheduled Meds:  ALPRAZolam  1 mg Oral BID   aspirin EC  81 mg Oral Daily   conjugated estrogens  1 Applicatorful Vaginal Daily   DULoxetine  60 mg Oral Daily   enoxaparin (LOVENOX) injection  80 mg  Subcutaneous Q12H   [START ON 06/06/2022] erythromycin  125 mg Oral TID AC   levothyroxine  125 mcg Oral QAC breakfast   metroNIDAZOLE  500 mg Oral TID PC   pantoprazole (PROTONIX) IV  40 mg Intravenous Q12H   Continuous Infusions:  lactated ringers 100 mL/hr at 06/05/22 1041     LOS: 1 day    Time spent: 28mns    JKathie Dike MD [facility]   If 7PM-7AM, please contact night-coverage [email or web address]  06/05/2022, 6:37 PM

## 2022-06-05 NOTE — TOC Progression Note (Signed)
Transition of Care [facility]) - Progression Note    Patient Details  Name: Shekera Beavers MRN: [id number] Date of Birth: 09-Mar-1961  Transition of Care [facility]) CM/SW [place], [place] Phone Number: 06/05/2022, 11:26 AM  Clinical Narrative:    Pt now noted to be high risl for readmission. CSW spoke with pts husband who was in room with pt to complete assessment. Pt is independent in completing her ADLs. Pt is able to drive when she feels well. Pt has had HH in the past with a company formally known as [facility]. Pt does not use any DME in the home. TOC to follow.   Expected Discharge Plan: Home/Self Care Barriers to Discharge: Continued Medical Work up  Expected Discharge Plan and Services Expected Discharge Plan: Home/Self Care       Living arrangements for the past 2 months: Single Family Home                                       Social Determinants of Health (SDOH) Interventions    Readmission Risk Interventions    06/05/2022   11:24 AM 03/30/2022    1:37 PM  Readmission Risk Prevention Plan  Transportation Screening Complete Complete  HRI or Home Care Consult  Complete  Social Work Consult for [place] Planning/Counseling  Complete  Palliative Care Screening  Not Applicable  Medication Review [employer] photographer) Complete Complete  HRI or [place] Complete   SW Recovery Care/Counseling Consult Complete   [place] Not Applicable

## 2022-06-05 NOTE — Consult Note (Signed)
Gastroenterology Consult   Referring Provider: No ref. provider found Primary Care Physician:  Fayrene Helper, MD Primary Gastroenterologist:  Dr. Jenetta Downer  Patient ID: [name]; [id number]; [birth year]   Admit date: 06/03/2022  LOS: 1 day   Date of Consultation: 06/05/2022  Reason for Consultation:  Nausea, vomiting, diarrhea, abdominal pain  History of Present Illness   Galya Dunnigan is a 61 y.o. year old female with a history of GERD, PSVT, TIAs, fibromyalgia, chronic pain syndrome, anemia, thyroid disease, DVT and PE on Xarelto, A fib s/p ablation, and complicated GI history including partial colectomy due to redundant colon with primary anastomosis, perforated bowel related to gynecologic procedure complicated by enterocutaneous fistula that required multiple surgeries and multiple SBO's with admission for partial SBO 10/25/2021, admitted again in November due to ongoing nausea, vomiting, diarrhea, abdominal pain with suspected pseudoobstruction and possible gastroparesis and small bowel dysmotility.  Also with hospitalization 12/02/2021 for syncopal episode with ongoing nausea, vomiting, abdominal distention.  She has had GES with documented gastroparesis, most recently admitted 4/3 with small bowel obstruction and underwent EGD with pyloric Botox treatment on 4/4 while inpatient.  She has been readmitted with nausea, vomiting, abdominal pain, diarrhea and evidence of partial small bowel obstruction on CT.  GI consulted for further evaluation and management.  Since last hospitalization 4/3 -4/6, she has had 1 additional ED visit for dehydration.  She has required IV fluids on a regular basis due to decreased oral intake.  She was seen on 05/22/2022 by Davene Costain, NP with [facility].  Detailed history outlined in this note including prior colonoscopy in March 2015 with colonic dilation noting to have awkward angulation of  neoterminal ileum with a tight luminal stricture near the fistula.  This was prior to [facility] fistula takedown in 2015 with Dr. Dossie Der.  She has reportedly had significant weight loss, weight 239 May 2022 currently weighing 173 pounds.  She stated that constipation has been getting worse only having bowel movement every few days using multiple laxatives and enemas.  She had also complained of some rectal pain as well as nausea and vomiting.  There was to be discussion with Dr. Liana Gerold and Dr. Harl Bowie to consider evaluation via colonoscopy to further evaluate stricture as cause of symptoms and then recommend reassessing gastric emptying.    ED course: CT A/P with IV contrast on 6/7 with chronic dilation of multiple small bowel loops in the right abdomen with associated scattered air-fluid levels not substantially changed from 4/1 CT.  Fluid throughout the remnant distal colon and rectum suggesting nonspecific malabsorptive state.  Partial mechanical distal small bowel obstruction cannot be excluded given absence of oral contrast, findings may alternatively represent chronic postsurgical changes or adynamic ileus, no bowel wall thickening. Labs -mildly elevated LFTs, AST 47, ALT 63, alk phos 137 (previously elevated in the past), lipase 21, CBC normal, magnesium 1.6, phosphorus 3.8.   She has been evaluated by general surgery as well regarding her CT findings and abdominal pain.  Dr. Blake Divine discussed CT with radiology with conclusion that p.o. or rectal contrast would not give any additional information.  Conclusion that no acute surgical intervention indicated at this time and is guarded given her history of EC fistulas and given no clear sign of bowel obstruction on CT and not appearing to be clinically obstructed.  It was felt that there was no clear explanation for her new pain other than her chronic issues, suspected multiple D innervated  areas of small bowel that did not work properly and advised to  advance diet as tolerated.  Consult: Diet - currently on full liquids but stated she had a lemon cream cookie this morning.   Diarrhea - 14 times yesterday, has went 5 times this morning (watery with small pieces of bowel. Does have urgency. Tried to eat grits this morning. Started about 3-4 days ago. Before that she was very constipated. Was taking stool softener and laxative along with the Amitiza. No sick contacts (only around husband and grandkids - they are not in school). T max 99.1 at home, has been having chills but has been more cold recently with weight loss. Nausea has gotten better, only one small episode this morning. Not a lot of bloating or flatulence. Does have intermittent rectal pain and uses suppositories and a numbing cream for her rectum (Lidocaine). Stated that she has always declined imodium and other antidiarrheals in the past due to being told by Dr. Dossie Der in the past to not due that due to constipation risks.   Stated [facility] gastroparesis nutritionist was to call her but they have not called her yet. Wants to get something done but felt like it was hopeless because she was told they did not want to go in surgically but may be able to do something endoscopically upper for the gastroparesis and possible dilation from below. Felt like her vomit was long pieces of food compacted together and then it loosened up. She felt like she was choking while vomiting. Likes to eat bland foods at home, toast, crackers, chicken soup. Will eat meats slowly. Was doing premier proteins(they were to thick). Has bought almond milk chocolate and regular). Has tried ensures as well. Stated her bowel output via NGT was increased with TPN in the past.   Abdominal pain - Feels like knots come up and then she hears a loud noise in her bowel and the sharp pain that radiates to her back and then she has to go to the bathroom. Right now 8/10, when going the bathroom its 10/10, yesterday she was in tears and the  pain medication has helped for breakthrough. Used to pass out on the commode long time ago from straining to go, then had mushy stools after colon surgery. Since October she has had worsening of constipation and has been denied [employer] twice. She states this midline/RLQ pain is different than her normal pain and prior SBO pain. The pain makes her physically sick and nauseas with vomiting (forceful/violent). Reports her throat feels raw.   Has been taking the erythromycin full dose once nightly (750 mg).   She stated she had been seeing Dr. Sharen Counter at [facility] in regards to her hernia and that she could possibly help her once she gets her bowels fixed.  Received communication from [place] to request rectally injected contrast for CT scan of abdomen and pelvis to evaluate possible area of stricturing.  Past Medical History:  Diagnosis Date   Abdominal hernia 08/14/2019   Abdominal pain 06/06/2019   Acute bronchitis 05/01/2013   Acute cystitis 06/09/2010   Qualifier: Diagnosis of  By: Cori Razor LPN, Brandi     Acute renal insufficiency 08/28/2011   Allergy    Phreesia 03/16/2021   Anemia    Phreesia 10/29/2020   Anxiety    Anxiety state 03/16/2008   Qualifier: Diagnosis of  By: Dierdre Harness     Arthritis    Phreesia 10/29/2020   Back pain with radiation 07/17/2014   Bilateral  hand pain 07/27/2016   Blood transfusion without reported diagnosis    Phreesia 10/29/2020   Breast cancer ([place])    L breast- 2006   Cancer ([place])    Phreesia 10/29/2020   Carpal tunnel syndrome    Chronic constipation    Chronic pain syndrome    followed by [facility]---  back   Clotting disorder ([place])    Phreesia 10/29/2020   Depression    Dermatitis 12/15/2011   Difficult intubation    Educated about COVID-19 virus infection 05/14/2019   Fall at home 01/26/2016   Family history of adverse reaction to anesthesia    MOTHER--- PONV   Fatigue 09/17/2012   Fibromyalgia    FIBROMYALGIA 03/16/2008   Qualifier: Diagnosis of   By: Dierdre Harness     GERD (gastroesophageal reflux disease)    Headache disorder 01/16/2013   Headache syndrome 01/26/2016   Hip pain, right 07/08/2019   History of MRSA infection    lip abscess   History of ovarian cyst 06/2011   s/p  BSO   History of pulmonary embolus (PE) 1997   post EP with ablation pulmonary veouns for SVT/ Atrial Fib.   History of supraventricular tachycardia    s/p  ablation 1996  and 1997  by dr Caryl Comes   History of TIA (transient ischemic attack) 1997   post op EP ablation PE   Hyperlipidemia    Hyperlipidemia LDL goal <100 03/16/2008   Qualifier: Diagnosis of  By: Dierdre Harness     Hypothyroidism    followed by pcp   Incisional hernia    Insomnia 12/15/2011   Intermittent palpitations 08/22/2017   Interstitial cystitis    09-13-2018   per pt last flare-up  May 2019 (followed by pcp)   INTERSTITIAL CYSTITIS 02/17/2011   Qualifier: Diagnosis of  By: Moshe Cipro MD, Margaret     Iron deficiency anemia    09-13-2018  PER PT STABLE   Irregular heart rate 07/25/2013   Lipoma of back    upper   Low back pain radiating to right leg 07/04/2019   Low ferritin level 06/14/2014   MDD (major depressive disorder), single episode, in full remission ([place]) 10/27/2017   Medically noncompliant 02/28/2015   Multiple missed appointments, both follow-up appointments and lab appointments.    Metabolic syndrome X 8/84/1660   Qualifier: Diagnosis of  By: Moshe Cipro MD, Margaret  hBA1c is 5.8 in 02/2013    Migraines    Morbid obesity ([place]) 03/27/2013   Muscle spasm 01/03/2020   Nausea alone 07/17/2014   NECK PAIN, CHRONIC 03/16/2008   Qualifier: Diagnosis of  By: Dierdre Harness     Normal coronary arteries    a. by CT 12/2018.   Obesity 03/16/2008   Qualifier: Diagnosis of  By: Dierdre Harness     Oral ulceration 08/23/2014   Presented at 06/14/2014 visit    Other malaise and fatigue 03/16/2008   Centricity Description: FATIGUE, CHRONIC Qualifier: Diagnosis of  By: Dierdre Harness    Centricity Description: FATIGUE Qualifier: Diagnosis of  By: Claybon Jabs PA, Dawn     OVARIAN CYST 12/26/2009   Qualifier: Diagnosis of  By: Moshe Cipro MD, Margaret     Paget disease of breast, left [facility])    Paget's disease of breast, left ([place]) 03/16/2008   Qualifier: Diagnosis of  By: Dierdre Harness  Left diagnosed in 2006 F/h breast cancer x 15 family members   Partial small bowel obstruction ([place]) 07/04/2012   PONV (postoperative nausea and vomiting)  SEVERE   Postsurgical menopause 11/13/2011   Presence of IVC filter 06/06/2019   PSVT (paroxysmal supraventricular tachycardia) ([place])    [place]   Recurrent oral herpes simplex infection 10/13/2017   ROM (right otitis media) 01/23/2013   S/P insertion of IVC (inferior vena caval) filter 05/08/2005   [place] (non-retrievable)  /  dx 2019  a leg of filter is protruding thru the vena cava in to right L2 vertebral body (09-13-2018  per pt having surgery to remove filter in [place])   S/P radiofrequency ablation operation for arrhythmia 1996   1996  and 1997,   SVT and Atrial Fib   Sinusitis, chronic 01/26/2016   SMALL BOWEL OBSTRUCTION, HX OF 08/07/2008   Annotation: obstruction w/ adhesions led to partial colectomy Qualifier: Diagnosis of  By: Craige Cotta     Stroke [facility])    [place] 10/29/2020, TIAs in the past   Swelling of hand 09/17/2012   Syncope    Tachycardia 02/03/2010   Qualifier: Diagnosis of  By: Via LPN, Lynn     Thyroid disease    Phreesia 10/29/2020   Ulcer 12/15/2011   Urinary frequency 07/18/2014   Vaginitis and vulvovaginitis 05/10/2013   Vitamin D deficiency 05/05/2015   Wears glasses     Past Surgical History:  Procedure Laterality Date   ABDOMINAL HYSTERECTOMY  1987   ANTERIOR CERVICAL DECOMP/DISCECTOMY FUSION  03-07-2002   dr elsner  '@[facility]'    C 4 -- 5   APPENDECTOMY  1980   AUGMENTATION MAMMAPLASTY Right 2006   BILATERAL SALPINGOOPHORECTOMY  07/24/2011   via [facility]or[facility] Lap. w/ intraoperative perf. bowel  repair   BIOPSY  05/02/2021   Procedure: BIOPSY;  Surgeon: Montez Morita, Quillian Quince, MD;  [facility];  Service: Gastroenterology;;  small bowel, mid esophagus, distal esophagus, random colon biopsies   BIOPSY  12/04/2021   Procedure: BIOPSY;  Surgeon: [name], DO;  [facility];  Service: Endoscopy;;   BIOPSY  03/31/2022   Procedure: BIOPSY;  Surgeon: Harvel Quale, MD;  [facility];  Service: Gastroenterology;;   BOTOX INJECTION N/A 03/31/2022   Procedure: BOTOX INJECTION;  Surgeon: Harvel Quale, MD;  [facility];  Service: Gastroenterology;  Laterality: N/A;   BREAST BIOPSY Right 2019   benign   BREAST ENHANCEMENT SURGERY Bilateral 1993   BREAST IMPLANT REMOVAL Bilateral    BREAST SURGERY N/A    Phreesia 10/29/2020   CARDIAC CATHETERIZATION  07-06-2003   dr Darnell Level brodie   normal coronaries and LVF   CARDIAC ELECTROPHYSIOLOGY STUDY AND ABLATION  1996  and 1997   CARDIOVASCULAR STRESS TEST  11-18-2015   dr Caryl Comes   normal nuclear study w/ no ischemia/  normal LV function and wall motion , ef 84%   CARPAL TUNNEL RELEASE Right ?   CESAREAN SECTION  1986   CESAREAN SECTION N/A    Phreesia 10/29/2020   CHOLECYSTECTOMY N/A    Phreesia 10/29/2020   COLON SURGERY     COLONOSCOPY WITH PROPOFOL N/A 05/02/2021   Procedure: COLONOSCOPY WITH PROPOFOL;  Surgeon: Harvel Quale, MD;  [facility];  Service: Gastroenterology;  Laterality: N/A;  12:30 PM   CYSTO/  HYDRODISTENTION/  INSTILATION THERAPY  MULTIPLE   ENTEROCUTANEOUS FISTULA CLOSURE  multiple   last one 2015 with small bowel resection   ESOPHAGOGASTRODUODENOSCOPY (EGD) WITH PROPOFOL N/A 05/02/2021   Procedure: ESOPHAGOGASTRODUODENOSCOPY (EGD) WITH PROPOFOL;  Surgeon: Harvel Quale, MD;  [facility];  Service: Gastroenterology;  Laterality: N/A;   ESOPHAGOGASTRODUODENOSCOPY (EGD) WITH PROPOFOL N/A 12/04/2021   Procedure:  ESOPHAGOGASTRODUODENOSCOPY (EGD) WITH PROPOFOL;  Surgeon: [name], DO;  [facility];  Service: Endoscopy;  Laterality: N/A;   ESOPHAGOGASTRODUODENOSCOPY (EGD) WITH PROPOFOL N/A 03/31/2022   Procedure: ESOPHAGOGASTRODUODENOSCOPY (EGD) WITH PROPOFOL;  Surgeon: Harvel Quale, MD;  [facility];  Service: Gastroenterology;  Laterality: N/A;  [place] / RESECTION SMALL BOWEL  11-09-2014   '@[facility]'    FRACTURE SURGERY N/A    Phreesia 10/29/2020   JOINT REPLACEMENT N/A    Phreesia 10/29/2020   LIPOMA EXCISION Right 09/16/2018   Procedure: EXCISION LIPOMA UPPER BACK;  Surgeon: Clovis Riley, MD;  Location: [place];  Service: General;  Laterality: Right;   MASTECTOMY Left 2006   w/ reconstruction on left (paget's disease)  and right breast augmentation   POLYPECTOMY  05/02/2021   Procedure: POLYPECTOMY;  Surgeon: Harvel Quale, MD;  [facility];  Service: Gastroenterology;;  gastric   POLYPECTOMY  03/31/2022   Procedure: POLYPECTOMY;  Surgeon: Harvel Quale, MD;  [facility];  Service: Gastroenterology;;   Azzie Almas DILATION  05/02/2021   Procedure: Azzie Almas DILATION;  Surgeon: Harvel Quale, MD;  [facility];  Service: Gastroenterology;;   SMALL INTESTINE SURGERY N/A    Phreesia 10/29/2020   SPINE SURGERY N/A    Phreesia 10/29/2020   TOTAL COLECTOMY  08-04-2002    '@[facility]'    AND CHOLECYSTECTOMY  (colonic inertia)   TRANSTHORACIC ECHOCARDIOGRAM  02/04/2016   ef 60-65%,  grade 2 diastolic dysfunction/  mild MR   TUBAL LIGATION N/A    Phreesia 03/16/2021   VENA CAVA FILTER PLACEMENT  05/08/2005   '@[facility]'    [place] (non-retrievable)   WIDE EXCISION PERIRECTAL ABSCESSES  09-22-2005   @ [facility]    Prior to Admission medications   Medication Sig Start Date End Date Taking? Authorizing Provider  ALPRAZolam [name]) 1 MG tablet  Take 1 tablet (1 mg total) by mouth 2 (two) times daily. 04/29/22  Yes Fayrene Helper, MD  aspirin 81 MG chewable tablet Chew 81 mg by mouth daily.   Yes [provider]  BIOTIN PO Take 1 capsule by mouth daily.   Yes [provider]  butalbital-acetaminophen-caffeine (FIORICET) 50-325-40 MG tablet Take 1 tablet by mouth every 6 (six) hours as needed. 04/11/22  Yes [provider]  conjugated estrogens (PREMARIN) vaginal cream Place 1 Applicatorful (1 application. total) vaginally daily. 05/18/22  Yes Fayrene Helper, MD  cyanocobalamin (,VITAMIN B-12,) 1000 MCG/ML injection Inject 1 mL (1,000 mcg total) into the muscle every 30 (thirty) days. 09/18/21  Yes Pennington, Rebekah M, PA-C  cyclobenzaprine (FLEXERIL) 10 MG tablet TAKE 1 TABLET BY MOUTH TWICE DAILY AS NEEDED FOR MUSCLE SPASMS Patient taking differently: Take 10 mg by mouth 2 (two) times daily. 02/21/20  Yes Fayrene Helper, MD  cycloSPORINE (RESTASIS) 0.05 % ophthalmic emulsion Place 1 drop into both eyes 2 (two) times daily as needed (dry eyes).   Yes [provider]  DULoxetine (CYMBALTA) 60 MG capsule Take 60 mg by mouth daily. 02/09/22  Yes [provider]  erythromycin (ERY-TAB) 250 MG EC tablet TAKE 1 TABLET BY MOUTH THREE TIMES DAILY BEFORE MEALS Patient taking differently: 250 mg every evening. 04/06/22  Yes Harvel Quale, MD  ezetimibe (ZETIA) 10 MG tablet Take 1 tablet (10 mg  total) by mouth daily. Patient taking differently: Take 10 mg by mouth at bedtime. 09/28/21  Yes Fayrene Helper, MD  fluticasone (FLONASE) 50 MCG/ACT nasal spray Place 2 sprays into both nostrils daily as needed for allergies. 12/05/21  Yes Johnson, Clanford L, MD  HYDROmorphone (DILAUDID) 4 MG tablet Take 4 mg by mouth 3 (three) times daily.   Yes [provider]  levothyroxine (SYNTHROID) 125 MCG tablet TAKE 1 TABLET(125 MCG) BY MOUTH DAILY BEFORE BREAKFAST Patient taking  differently: Take 125 mcg by mouth daily before breakfast. 08/26/21  Yes Fayrene Helper, MD  lubiprostone (AMITIZA) 24 MCG capsule TAKE 1 CAPSULE(24 MCG) BY MOUTH TWICE DAILY WITH A MEAL 05/18/22  Yes Carlan, Chelsea L, NP  magnesium oxide (MAG-OX) 400 (240 Mg) MG tablet Take 1 tablet (400 mg total) by mouth 2 (two) times daily. Please make overdue appt with Dr. Caryl Comes before anymore refills. Thank you 1st attempt 09/03/21  Yes Deboraha Sprang, MD  meclizine (ANTIVERT) 25 MG tablet Take by mouth. 06/11/20  Yes [provider]  metFORMIN (GLUCOPHAGE-XR) 500 MG 24 hr tablet Take 500 mg by mouth every evening. 01/15/22  Yes [provider]  Multiple Vitamins-Minerals (ICAPS AREDS 2 PO) Take 1 capsule by mouth every evening.   Yes [provider]  omeprazole (PRILOSEC) 40 MG capsule Take 1 capsule (40 mg total) by mouth in the morning and at bedtime. 11/18/21  Yes Kathie Dike, MD  potassium chloride (KLOR-CON M) 10 MEQ tablet TAKE 1 TABLET BY MOUTH EVERY DAY 05/18/22  Yes Fayrene Helper, MD  pravastatin (PRAVACHOL) 40 MG tablet TAKE 1 TABLET(40 MG) BY MOUTH DAILY Patient taking differently: Take 40 mg by mouth at bedtime. 01/08/22  Yes Fayrene Helper, MD  SUPER B COMPLEX/C PO Take 1 capsule by mouth daily.   Yes [provider]  XARELTO 20 MG TABS tablet TAKE 1 TABLET(20 MG) BY MOUTH DAILY 05/26/22  Yes Fayrene Helper, MD    Current Facility-Administered Medications  Medication Dose Route Frequency Provider Last Rate Last Admin   ALPRAZolam [name]) tablet 1 mg  1 mg Oral BID Elgergawy, Silver Huguenin, MD   1 mg at 06/05/22 0933   butalbital-acetaminophen-caffeine (FIORICET) 50-325-40 MG per tablet 1 tablet  1 tablet Oral Q6H PRN Elgergawy, Silver Huguenin, MD       conjugated estrogens (PREMARIN) vaginal cream 1 Applicatorful  1 Applicatorful Vaginal Daily Elgergawy, Silver Huguenin, MD   1 Applicatorful at 19/62/22 0933   cycloSPORINE (RESTASIS) 0.05 % ophthalmic  emulsion 1 drop  1 drop Both Eyes BID PRN Elgergawy, Silver Huguenin, MD       DULoxetine (CYMBALTA) DR capsule 60 mg  60 mg Oral Daily Elgergawy, Silver Huguenin, MD   60 mg at 06/05/22 0933   enoxaparin (LOVENOX) injection 80 mg  80 mg Subcutaneous Q12H Hall, Scott A, RPH   80 mg at 06/05/22 0610   fluticasone (FLONASE) 50 MCG/ACT nasal spray 2 spray  2 spray Each Nare Daily PRN Elgergawy, Silver Huguenin, MD       HYDROmorphone (DILAUDID) injection 1 mg  1 mg Intravenous Q4H PRN Elgergawy, Silver Huguenin, MD   1 mg at 06/05/22 0604   lactated ringers infusion   Intravenous Continuous Elgergawy, Silver Huguenin, MD 100 mL/hr at 06/05/22 0040 New Bag at 06/05/22 0040   levothyroxine (SYNTHROID) tablet 125 mcg  125 mcg Oral QAC breakfast Elgergawy, Silver Huguenin, MD   125 mcg at 06/05/22 0603   pantoprazole (PROTONIX) injection  40 mg  40 mg Intravenous Q12H Kathie Dike, MD   40 mg at 06/05/22 0933    Allergies as of 06/03/2022 - Review Complete 06/03/2022  Allergen Reaction Noted   Nitrofurantoin Hives 03/16/2008   Crestor [rosuvastatin calcium] Other (See Comments) 08/08/2019   Bisacodyl Other (See Comments) 07/03/2013   Clarithromycin Hives and Other (See Comments) 09/22/2010   Clindamycin hcl Hives 08/29/2011   Codeine Itching 03/16/2008   Iron sucrose Other (See Comments) 03/10/2021   Monosodium glutamate Other (See Comments) 06/26/2014   Scopolamine hbr Other (See Comments) 08/29/2011   Other Hives, Itching, Swelling, and Rash 11/11/2020    Family History  Problem Relation Age of Onset   Diabetes Mother    Heart disease Mother    Hypertension Mother    Heart disease Father    Hyperlipidemia Father    Hypertension Father    Alcohol abuse Father    Colon cancer Maternal Aunt    Breast cancer Maternal Aunt    Cancer Maternal Uncle        mets   Bone cancer Maternal Grandfather        mets   Ovarian cancer Cousin 19   Breast cancer Cousin    Prostate cancer Maternal Uncle    Breast cancer Maternal Aunt     Brain cancer Maternal Aunt    Breast cancer Maternal Aunt     Social History   Socioeconomic History   Marital status: Married    Spouse name: Not on file   Number of children: Not on file   Years of education: Not on file   Highest education level: Not on file  Occupational History   Not on file  Tobacco Use   Smoking status: Never   Smokeless tobacco: Never  Vaping Use   Vaping Use: Never used  Substance and Sexual Activity   Alcohol use: No    Comment: twice per year   Drug use: No   Sexual activity: Yes    Birth control/protection: Surgical  Other Topics Concern   Not on file  Social History Narrative   Not on file   Social Determinants of Health   Financial Resource Strain: Medium Risk (05/14/2021)   Overall Financial Resource Strain (CARDIA)    Difficulty of Paying Living Expenses: Somewhat hard  Food Insecurity: No Food Insecurity (05/14/2021)   Hunger Vital Sign    Worried About Running Out of Food in the Last Year: Never true    [place] in the Last Year: Never true  Transportation Needs: No Transportation Needs (05/14/2021)   PRAPARE - Hydrologist (Medical): No    Lack of Transportation (Non-Medical): No  Physical Activity: Insufficiently Active (05/14/2021)   Exercise Vital Sign    Days of Exercise per Week: 7 days    Minutes of Exercise per Session: 20 min  Stress: No Stress Concern Present (05/14/2021)   [place]    Feeling of Stress : Not at all  Social Connections: Moderately Isolated (05/14/2021)   Social Connection and Isolation Panel [NHANES]    Frequency of Communication with Friends and Family: More than three times a week    Frequency of Social Gatherings with Friends and Family: More than three times a week    Attends Religious Services: Never    Marine scientist or Organizations: No    Attends Archivist Meetings: Never     Marital  Status: Married  Human resources officer Violence: Not At Risk (05/14/2021)   Humiliation, Afraid, Rape, and Kick questionnaire    Fear of Current or Ex-Partner: No    Emotionally Abused: No    Physically Abused: No    Sexually Abused: No     Review of Systems   Gen: Denies any fever, chills, loss of appetite, change in weight or weight loss CV: Denies chest pain, heart palpitations, syncope, edema  Resp: Denies shortness of breath with rest, cough, wheezing, coughing up blood, and pleurisy. GI: see HPI GU : Denies urinary burning, blood in urine, urinary frequency, and urinary incontinence. MS: Denies joint pain, limitation of movement, swelling, cramps, and atrophy.  Derm: Denies rash, itching, dry skin, hives. Psych: Denies depression, anxiety, memory loss, hallucinations, and confusion. Heme: Denies bruising or bleeding Neuro:  Denies any headaches, dizziness, paresthesias, shaking  Physical Exam   Vital Signs in last 24 hours: Temp:  [98.2 F (36.8 C)-98.5 F (36.9 C)] 98.2 F (36.8 C) (06/09 0351) Pulse Rate:  [74-79] 74 (06/09 0351) Resp:  [14-20] 15 (06/09 0351) BP: (90-100)/(58-60) 90/58 (06/09 0351) SpO2:  [93 %-100 %] 94 % (06/09 0351) Last BM Date : 06/04/22  General:   Alert,  Well-developed, well-nourished, pleasant and cooperative in NAD Head:  Normocephalic and atraumatic. Eyes:  Sclera clear, no icterus.   Conjunctiva pink. Ears:  Normal auditory acuity. Mouth:  No deformity or lesions, dentition normal. Neck:  Supple; no masses Lungs:  Clear throughout to auscultation.   No wheezes, crackles, or rhonchi. No acute distress. Heart:  Regular rate and rhythm; no murmurs, clicks, rubs,  or gallops. Abdomen:  Soft, tenderness to right lower and midline abdomen around old surgical scars. Visible bowel peristalsis observed. Hyperactive bowel sounds present.   Rectal: deferred Msk:  Symmetrical without gross deformities. Normal posture. Extremities:  Without  clubbing or edema. Neurologic:  Alert and oriented x4. Skin: scarring to abdomen, scattered bruising.  Psych:  Alert and cooperative. Normal mood and affect.  Intake/Output from previous day: 06/08 0701 - 06/09 0700 In: 840 [P.O.:840] Out: -  Intake/Output this shift: No intake/output data recorded.   Labs/Studies   Recent Labs Recent Labs    06/03/22 1201 06/04/22 0548  WBC 2.2* 2.7*  HGB 14.1 13.1  HCT 42.8 40.4  PLT 189 157   BMET Recent Labs    06/03/22 1201 06/04/22 1201  NA 139 136  K 3.6 3.9  CL 103 104  CO2 28 23  GLUCOSE 102* 86  BUN 18 14  CREATININE 0.79 0.67  CALCIUM 8.7* 8.3*   LFT Recent Labs    06/03/22 1201  PROT 6.7  ALBUMIN 3.8  AST 47*  ALT 63*  ALKPHOS 137*  BILITOT 0.8   PT/INR No results for input(s): "LABPROT", "INR" in the last 72 hours. Hepatitis Panel No results for input(s): "HEPBSAG", "HCVAB", "HEPAIGM", "HEPBIGM" in the last 72 hours. C-Diff No results for input(s): "CDIFFTOX" in the last 72 hours.  Radiology/Studies CT ABDOMEN PELVIS W CONTRAST  Result Date: 06/03/2022 CLINICAL DATA:  Right lower quadrant abdominal pain, nausea and vomiting since last night. History of multiple abdominal surgeries and bowel obstruction. EXAM: CT ABDOMEN AND PELVIS WITH CONTRAST TECHNIQUE: Multidetector CT imaging of the abdomen and pelvis was performed using the standard protocol following bolus administration of intravenous contrast. RADIATION DOSE REDUCTION: This exam was performed according to the departmental dose-optimization program which includes automated exposure control, adjustment of the mA and/or kV according to patient size  and/or use of iterative reconstruction technique. CONTRAST:  122m OMNIPAQUE IOHEXOL 300 MG/ML  SOLN COMPARISON:  03/28/2022 CT abdomen/pelvis. FINDINGS: Lower chest: No acute abnormality at the lung bases. Partially visualized right breast prosthesis. Hepatobiliary: Normal liver size. No liver mass.  Cholecystectomy. Bile ducts are stable and within normal post cholecystectomy limits with CBD diameter 8 mm. Pancreas: Normal, with no mass or duct dilation. Spleen: Normal size. No mass. Adrenals/Urinary Tract: Normal adrenals. Normal kidneys with no hydronephrosis and no renal mass. Normal collapsed bladder. Stomach/Bowel: Normal non-distended stomach. Redemonstrated multiple enteroenterostomy suture lines from prior small bowel resections in the central and right abdomen. Redemonstrated subtotal colectomy with intact appearing entero-colonic anastomosis in the central lower abdomen. Chronic dilatation of multiple small bowel loops in the right abdomen up to 4.3 cm diameter, with associated scattered air-fluid levels, not substantially changed from 03/28/2022 CT. No small bowel wall thickening. Proximal small bowel loops are nondistended. Fluid throughout the remnant rectum and large bowel. No colorectal wall thickening. Vascular/Lymphatic: Normal caliber abdominal aorta. Patent portal, splenic, hepatic and renal veins. No pathologically enlarged lymph nodes in the abdomen or pelvis. Reproductive: Status post hysterectomy, with no abnormal findings at the vaginal cuff. No adnexal mass. Other: No pneumoperitoneum, ascites or focal fluid collection. Musculoskeletal: No aggressive appearing focal osseous lesions. Mild thoracolumbar spondylosis. IMPRESSION: Chronic dilatation of multiple small bowel loops in the right abdomen with associated scattered air-fluid levels, not substantially changed from 03/28/2022 CT. Fluid throughout the remnant distal colon and rectum, suggesting a nonspecific malabsorptive state. A partial mechanical distal small bowel obstruction cannot be excluded, particularly given the absence of oral contrast on this scan. Findings may alternatively represent chronic postsurgical changes or adynamic ileus. No bowel wall thickening. Electronically Signed   By: JIlona SorrelM.D.   On: 06/03/2022  14:23     Assessment   SKehinde Totzkeis a 61y.o. year old female with a complicated GI history as outlined above.  She has had multiple hospital admissions for GI complaints and dehydration as outlined above. She has been readmitted since 6/7 with nausea, vomiting, abdominal pain, diarrhea and evidence of partial small bowel obstruction on CT. GI consulted for further evaluation and management.  Partial small bowel obstruction?: She presented to the ED with nausea, vomiting, and abdominal pain. She has had multiple admissions for small bowel obstruction prior, last being 03/30/22.  There have been concerns prior that she may have small bowel stricture given history of multiple abdominal surgeries.  She has had [facility] in the past consistent with gastroparesis.  EGD on 4/4 with Botox treatment to pylorus.  EGD findings included salmon-colored mucosa suspicious for short segment Barrett's esophagus with biopsy revealing cardiac mucosa negative for metaplasia or dysplasia, normal stomach injected with botulinum toxin, and single duodenal polyp suggestive of nodular peptic duodenitis.  Has been on omeprazole 40 mg twice daily, will continue.  She is currently being followed by [facility], discussion for possible colonoscopy for further evaluation of possible small bowel stricture with possible dilation.  CT A/P with IV contrast on 6/7 with chronic dilation of multiple small bowel loops in the right abdomen, no bowel wall thickening, and partial mechanical distal small bowel obstruction or adynamic ileus unable to be excluded. Ready to try soft/BLAND diet. She has had follow up with [facility] and they have reached out to patient via MyChart and let her know that they would like to obtain CT with rectally injected contrast. Will need to discuss  with Dr. Laural Golden and radiology if that is capable of being performed here. Would favor waiting until diarrhea has improved as she may   Diarrhea: Patient has  previous history of constipation secondary to chronic pain and chronic opioid use, maintained on Amitiza 24 mcg twice daily.  Given looser frequent bowel movements, will hold at this time.  She has had similar complaints of intermittent frequent diarrhea with prior hospitalizations as well. Will check stool studies to rule out infectious etiology given reported multiple watery bowel movements a day, 14 yesterday and 5 this morning thus far. She declines any antidiarrheal medications and would be okay with BLAND soft diet as this is usually helpful at home and during past occurrences.  Gastroparesis: Confirmed by [facility] outpatient.  Has been maintained on erythromycin 250 mg 3 times daily.  Has had 1 appointment with [facility], they will potentially address small bowel obstruction first and then reassess gastric emptying. Some pill and solid dysphagia with initiating swallowing due to fear of nausea.   Plan / Recommendations   Continue to hold Amitiza in the setting of diarrhea Continue erythromycin 250 mg 3 times daily Continue PPI BID Antiemetics and pain management per hospitalist Diet - advance to soft BLAND diet.  Stool studies.  Potential for rectally injected contrast CT of abdomen on Monday.     06/05/2022, 9:41 AM  Venetia Night, MSN, FNP-BC, AGACNP-BC [facility]

## 2022-06-06 DIAGNOSIS — F411 Generalized anxiety disorder: Secondary | ICD-10-CM | POA: Diagnosis not present

## 2022-06-06 DIAGNOSIS — K56600 Partial intestinal obstruction, unspecified as to cause: Secondary | ICD-10-CM

## 2022-06-06 DIAGNOSIS — R109 Unspecified abdominal pain: Secondary | ICD-10-CM

## 2022-06-06 DIAGNOSIS — G894 Chronic pain syndrome: Secondary | ICD-10-CM | POA: Diagnosis not present

## 2022-06-06 DIAGNOSIS — R1084 Generalized abdominal pain: Secondary | ICD-10-CM | POA: Diagnosis not present

## 2022-06-06 DIAGNOSIS — I10 Essential (primary) hypertension: Secondary | ICD-10-CM | POA: Diagnosis not present

## 2022-06-06 LAB — GASTROINTESTINAL PANEL BY PCR, STOOL (REPLACES STOOL CULTURE)

## 2022-06-06 LAB — BASIC METABOLIC PANEL WITH GFR
Anion gap: 5 (ref 5–15)
BUN: 8 mg/dL (ref 6–20)
CO2: 20 mmol/L — ABNORMAL LOW (ref 22–32)
Calcium: 8.7 mg/dL — ABNORMAL LOW (ref 8.9–10.3)
Chloride: 112 mmol/L — ABNORMAL HIGH (ref 98–111)
Creatinine, Ser: 0.56 mg/dL (ref 0.44–1.00)
GFR, Estimated: >60 mL/min
Glucose, Bld: 92 mg/dL (ref 70–99)
Potassium: 3.3 mmol/L — ABNORMAL LOW (ref 3.5–5.1)
Sodium: 137 mmol/L (ref 135–145)

## 2022-06-06 MED ORDER — DICYCLOMINE HCL 10 MG PO CAPS
20.0000 mg | ORAL_CAPSULE | Freq: Three times a day (TID) | ORAL | Status: DC
Start: 2022-06-06 — End: 2022-06-07
  Administered 2022-06-06: 20 mg via ORAL
  Filled 2022-06-06 (×3): qty 2

## 2022-06-06 MED ORDER — KETOROLAC TROMETHAMINE 30 MG/ML IJ SOLN
30.0000 mg | Freq: Once | INTRAMUSCULAR | Status: AC
  Administered 2022-06-06: 30 mg via INTRAVENOUS
  Filled 2022-06-06: qty 1

## 2022-06-06 MED ORDER — CYCLOBENZAPRINE HCL 10 MG PO TABS
10.0000 mg | ORAL_TABLET | Freq: Two times a day (BID) | ORAL | Status: DC | PRN
  Administered 2022-06-06 (×2): 10 mg via ORAL
  Filled 2022-06-06 (×2): qty 1

## 2022-06-06 MED ORDER — POTASSIUM CHLORIDE CRYS ER 20 MEQ PO TBCR
40.0000 meq | EXTENDED_RELEASE_TABLET | Freq: Once | ORAL | Status: AC
  Administered 2022-06-06: 40 meq via ORAL
  Filled 2022-06-06: qty 2

## 2022-06-06 MED ORDER — ONDANSETRON HCL 4 MG/2ML IJ SOLN
4.0000 mg | Freq: Four times a day (QID) | INTRAMUSCULAR | Status: DC | PRN
  Administered 2022-06-06 – 2022-06-07 (×4): 4 mg via INTRAVENOUS
  Filled 2022-06-06 (×4): qty 2

## 2022-06-06 MED ORDER — HYDROMORPHONE HCL 2 MG PO TABS
4.0000 mg | ORAL_TABLET | ORAL | Status: DC | PRN
  Administered 2022-06-06 – 2022-06-07 (×3): 4 mg via ORAL
  Filled 2022-06-06 (×3): qty 2

## 2022-06-06 MED ORDER — SIMETHICONE 80 MG PO CHEW
80.0000 mg | CHEWABLE_TABLET | Freq: Four times a day (QID) | ORAL | Status: AC
  Administered 2022-06-06 (×4): 80 mg via ORAL
  Filled 2022-06-06 (×4): qty 1

## 2022-06-06 NOTE — Progress Notes (Signed)
Subjective:  Patient does not feel any better.  She remains with multiple loose stools.  She says she is passing more flatus particularly when she turns on the right side.  She only took 2 bites at breakfast.  She does not have an appetite.  She has not had any vomiting since last seen.  She denies melena or rectal bleeding.  She says while abdominal pain is diffuse but it is most pronounced in right lower quadrant.  Current Medications:  Current Facility-Administered Medications:    ALPRAZolam (XANAX) tablet 1 mg, 1 mg, Oral, BID, Elgergawy, Silver Huguenin, MD, 1 mg at 06/06/22 1287   aspirin EC tablet 81 mg, 81 mg, Oral, Daily, Memon, Jolaine Artist, MD, 81 mg at 06/06/22 0939   butalbital-acetaminophen-caffeine (FIORICET) 50-325-40 MG per tablet 1 tablet, 1 tablet, Oral, Q6H PRN, Elgergawy, Silver Huguenin, MD   conjugated estrogens (PREMARIN) vaginal cream 1 Applicatorful, 1 Applicatorful, Vaginal, Daily, Elgergawy, Silver Huguenin, MD, 1 Applicatorful at 86/76/72 0957   cyclobenzaprine (FLEXERIL) tablet 10 mg, 10 mg, Oral, BID PRN, Adefeso, Oladapo, DO, 10 mg at 06/06/22 0107   cycloSPORINE (RESTASIS) 0.05 % ophthalmic emulsion 1 drop, 1 drop, Both Eyes, BID PRN, Elgergawy, Silver Huguenin, MD   DULoxetine (CYMBALTA) DR capsule 60 mg, 60 mg, Oral, Daily, Elgergawy, Silver Huguenin, MD, 60 mg at 06/06/22 0939   enoxaparin (LOVENOX) injection 80 mg, 80 mg, Subcutaneous, Q12H, Hall, Scott A, RPH, 80 mg at 06/06/22 0503   erythromycin (E-MYCIN) tablet 125 mg, 125 mg, Oral, TID AC, Erhardt Dada U, MD, 125 mg at 06/06/22 1301   fluticasone (FLONASE) 50 MCG/ACT nasal spray 2 spray, 2 spray, Each Nare, Daily PRN, Elgergawy, Silver Huguenin, MD   HYDROmorphone (DILAUDID) injection 1 mg, 1 mg, Intravenous, Q4H PRN, Elgergawy, Silver Huguenin, MD, 1 mg at 06/06/22 0947   lactated ringers infusion, , Intravenous, Continuous, Elgergawy, Silver Huguenin, MD, Last Rate: 100 mL/hr at 06/06/22 0512, New Bag at 06/06/22 0962   levothyroxine (SYNTHROID) tablet 125  mcg, 125 mcg, Oral, QAC breakfast, Elgergawy, Silver Huguenin, MD, 125 mcg at 06/06/22 0503   lip balm (CARMEX) ointment 1 application , 1 application , Topical, PRN, Kathie Dike, MD   metroNIDAZOLE (FLAGYL) tablet 500 mg, 500 mg, Oral, TID PC, Venecia Mehl U, MD, 500 mg at 06/06/22 1301   ondansetron (ZOFRAN) injection 4 mg, 4 mg, Intravenous, Q6H PRN, Adefeso, Oladapo, DO, 4 mg at 06/06/22 0106   pantoprazole (PROTONIX) injection 40 mg, 40 mg, Intravenous, Q12H, Memon, Jehanzeb, MD, 40 mg at 06/06/22 0939   simethicone (MYLICON) chewable tablet 80 mg, 80 mg, Oral, QID, Solyana Nonaka U, MD, 80 mg at 06/06/22 0940   Objective: Blood pressure (!) 125/55, pulse 75, temperature 98.9 F (37.2 C), resp. rate 18, height '5\' 4"'  (1.626 m), weight 75.8 kg, SpO2 97 %. Patient is alert and in no acute distress. Abdominal exam reveals full abdomen with multiple scars.  Bowel sounds are hyperactive.  Percussion note is very tympanitic.  Abdomen is soft with diffuse tenderness which is most pronounced in right lower quadrant where abdomen is more distended.  No organomegaly or masses.  Labs/studies Results:      Latest Ref Rng & Units 06/04/2022    5:48 AM 06/03/2022   12:01 PM 05/12/2022    4:32 PM  CBC  WBC 4.0 - 10.5 K/uL 2.7  2.2  6.9   Hemoglobin 12.0 - 15.0 g/dL 13.1  14.1  13.5   Hematocrit 36.0 - 46.0 % 40.4  42.8  43.5   Platelets 150 - 400 K/uL 157  189  261        Latest Ref Rng & Units 06/06/2022    6:32 AM 06/04/2022   12:01 PM 06/03/2022   12:01 PM  CMP  Glucose 70 - 99 mg/dL 92  86  102   BUN 6 - 20 mg/dL '8  14  18   ' Creatinine 0.44 - 1.00 mg/dL 0.56  0.67  0.79   Sodium 135 - 145 mmol/L 137  136  139   Potassium 3.5 - 5.1 mmol/L 3.3  3.9  3.6   Chloride 98 - 111 mmol/L 112  104  103   CO2 22 - 32 mmol/L '20  23  28   ' Calcium 8.9 - 10.3 mg/dL 8.7  8.3  8.7   Total Protein 6.5 - 8.1 g/dL   6.7   Total Bilirubin 0.3 - 1.2 mg/dL   0.8   Alkaline Phos 38 - 126 U/L   137   AST 15 - 41  U/L   47   ALT 0 - 44 U/L   63        Latest Ref Rng & Units 06/03/2022   12:01 PM 05/12/2022    4:32 PM 04/02/2022    7:10 AM  Hepatic Function  Total Protein 6.5 - 8.1 g/dL 6.7  6.3  5.5   Albumin 3.5 - 5.0 g/dL 3.8  3.5  3.1   AST 15 - 41 U/L 47  27  38   ALT 0 - 44 U/L 63  41  77   Alk Phosphatase 38 - 126 U/L 137  131  106   Total Bilirubin 0.3 - 1.2 mg/dL 0.8  0.4  0.9      Stool studies  C. difficile antigen and toxin negative GI pathogen panel is pending.  Assessment:  #1.  Diarrhea.  Diarrhea appears to be multifactorial.  Viral infection has not been ruled out she most likely also has small intestinal bacterial overgrowth for which she was begun on metronidazole yesterday.  Amitiza has been discontinued.  Apparently Amitiza has not caused diarrhea in the past.  #2.  Gastroparesis.  Patient is back on low-dose erythromycin which appears to help her symptoms.  #3.  Acute on chronic abdominal pain.  Her symptoms are concerning for mechanical process but imaging studies do not show a well demarcated stricture.  She most likely has developed dysmotility as well given multiple surgeries in the past.  Given recurrent nature of her symptoms may need another laparotomy but it would be high risk. It is worth exploring if she is a candidate for small bowel transplant.  I will check with physicians at [facility] next week.  #4.  Leukopenia.  Leukopenia does not appear to be a chronic problem.  Leukopenia may be due to underlying viral gastroenteritis.  WBC is coming up.  #5.  History of pulmonary embolism.  Patient is on Xarelto.  Plan:  Continue p.o. erythromycin and metronidazole. Await for results of stool studies.

## 2022-06-06 NOTE — Plan of Care (Signed)
Pt is alert and oriented x 4. Up adlib. Pt refused multiple loose bm this shift. Dilaudid given prn x 3 doses. 1 dose of flexeril 1 dose of zofran. No vomiting noted.  Provider contacted during overnight for home Flexeril dose and zofran to be ordered. Vitals stable. BP soft. Afebrile. GI panel still pending.   Problem: Education: Goal: Knowledge of General Education information will improve Description: Including pain rating scale, medication(s)/side effects and non-pharmacologic comfort measures Outcome: Progressing   Problem: Health Behavior/Discharge Planning: Goal: Ability to manage health-related needs will improve Outcome: Progressing   Problem: Clinical Measurements: Goal: Ability to maintain clinical measurements within normal limits will improve Outcome: Progressing Goal: Will remain free from infection Outcome: Progressing Goal: Diagnostic test results will improve Outcome: Progressing Goal: Respiratory complications will improve Outcome: Progressing Goal: Cardiovascular complication will be avoided Outcome: Progressing   Problem: Activity: Goal: Risk for activity intolerance will decrease Outcome: Progressing   Problem: Nutrition: Goal: Adequate nutrition will be maintained Outcome: Progressing   Problem: Coping: Goal: Level of anxiety will decrease Outcome: Progressing   Problem: Elimination: Goal: Will not experience complications related to bowel motility Outcome: Progressing Goal: Will not experience complications related to urinary retention Outcome: Progressing   Problem: Pain Managment: Goal: General experience of comfort will improve Outcome: Progressing   Problem: Safety: Goal: Ability to remain free from injury will improve Outcome: Progressing   Problem: Skin Integrity: Goal: Risk for impaired skin integrity will decrease Outcome: Progressing

## 2022-06-06 NOTE — Progress Notes (Signed)
ANTICOAGULATION CONSULT NOTE - Consult  Pharmacy Consult for Lovenox Indication:  Allergies  Allergen Reactions   Nitrofurantoin Hives   Crestor [Rosuvastatin Calcium] Other (See Comments)    Generalized cramps   Bisacodyl Other (See Comments)    Makes patient feel like she is having cramps    Clarithromycin Hives and Other (See Comments)    Cluster migraines   Clindamycin Hcl Hives   Codeine Itching   Iron Sucrose Other (See Comments)    Flushing- required benadryl and solu medrol   Monosodium Glutamate Other (See Comments)    Cluster migraines    Scopolamine Hbr Other (See Comments)    Cluster migraines, impaired vision   Other Hives, Itching, Swelling and Rash    Cigarette smoke    Patient Measurements: Height: '5\' 4"'$  (162.6 cm) Weight: 75.8 kg (167 lb) IBW/kg (Calculated) : 54.7  Vital Signs:    Labs: Recent Labs    06/03/22 1201 06/04/22 0548 06/04/22 1201 06/06/22 0632  HGB 14.1 13.1  --   --   HCT 42.8 40.4  --   --   PLT 189 157  --   --   CREATININE 0.79  --  0.67 0.56     Estimated Creatinine Clearance: 74.5 mL/min (by C-G formula based on SCr of 0.56 mg/dL).   Medical History: Past Medical History:  Diagnosis Date   Abdominal hernia 08/14/2019   Abdominal pain 06/06/2019   Acute bronchitis 05/01/2013   Acute cystitis 06/09/2010   Qualifier: Diagnosis of  By: Cori Razor LPN, Brandi     Acute renal insufficiency 08/28/2011   Allergy    Phreesia 03/16/2021   Anemia    Phreesia 10/29/2020   Anxiety    Anxiety state 03/16/2008   Qualifier: Diagnosis of  By: Dierdre Harness     Arthritis    Phreesia 10/29/2020   Back pain with radiation 07/17/2014   Bilateral hand pain 07/27/2016   Blood transfusion without reported diagnosis    Phreesia 10/29/2020   Breast cancer ([place])    L breast- 2006   Cancer ([place])    Phreesia 10/29/2020   Carpal tunnel syndrome    Chronic constipation    Chronic pain syndrome    followed by [facility]---  back    Clotting disorder ([place])    Phreesia 10/29/2020   Depression    Dermatitis 12/15/2011   Difficult intubation    Educated about COVID-19 virus infection 05/14/2019   Fall at home 01/26/2016   Family history of adverse reaction to anesthesia    MOTHER--- PONV   Fatigue 09/17/2012   Fibromyalgia    FIBROMYALGIA 03/16/2008   Qualifier: Diagnosis of  By: Dierdre Harness     GERD (gastroesophageal reflux disease)    Headache disorder 01/16/2013   Headache syndrome 01/26/2016   Hip pain, right 07/08/2019   History of MRSA infection    lip abscess   History of ovarian cyst 06/2011   s/p  BSO   History of pulmonary embolus (PE) 1997   post EP with ablation pulmonary veouns for SVT/ Atrial Fib.   History of supraventricular tachycardia    s/p  ablation 1996  and 1997  by dr Caryl Comes   History of TIA (transient ischemic attack) 1997   post op EP ablation PE   Hyperlipidemia    Hyperlipidemia LDL goal <100 03/16/2008   Qualifier: Diagnosis of  By: Bullins, Luann     Hypothyroidism    followed by pcp   Incisional hernia  Insomnia 12/15/2011   Intermittent palpitations 08/22/2017   Interstitial cystitis    09-13-2018   per pt last flare-up  May 2019 (followed by pcp)   INTERSTITIAL CYSTITIS 02/17/2011   Qualifier: Diagnosis of  By: Moshe Cipro MD, Margaret     Iron deficiency anemia    09-13-2018  PER PT STABLE   Irregular heart rate 07/25/2013   Lipoma of back    upper   Low back pain radiating to right leg 07/04/2019   Low ferritin level 06/14/2014   MDD (major depressive disorder), single episode, in full remission ([place]) 10/27/2017   Medically noncompliant 02/28/2015   Multiple missed appointments, both follow-up appointments and lab appointments.    Metabolic syndrome X 04/15/3789   Qualifier: Diagnosis of  By: Moshe Cipro MD, Margaret  hBA1c is 5.8 in 02/2013    Migraines    Morbid obesity ([place]) 03/27/2013   Muscle spasm 01/03/2020   Nausea alone 07/17/2014   NECK PAIN, CHRONIC 03/16/2008   Qualifier:  Diagnosis of  By: Dierdre Harness     Normal coronary arteries    a. by CT 12/2018.   Obesity 03/16/2008   Qualifier: Diagnosis of  By: Dierdre Harness     Oral ulceration 08/23/2014   Presented at 06/14/2014 visit    Other malaise and fatigue 03/16/2008   Centricity Description: FATIGUE, CHRONIC Qualifier: Diagnosis of  By: Dierdre Harness   Centricity Description: FATIGUE Qualifier: Diagnosis of  By: Claybon Jabs PA, Dawn     OVARIAN CYST 12/26/2009   Qualifier: Diagnosis of  By: Moshe Cipro MD, Margaret     Paget disease of breast, left [facility])    Paget's disease of breast, left ([place]) 03/16/2008   Qualifier: Diagnosis of  By: Dierdre Harness  Left diagnosed in 2006 F/h breast cancer x 15 family members   Partial small bowel obstruction ([place]) 07/04/2012   PONV (postoperative nausea and vomiting)    SEVERE   Postsurgical menopause 11/13/2011   Presence of IVC filter 06/06/2019   PSVT (paroxysmal supraventricular tachycardia) ([place])    [place]   Recurrent oral herpes simplex infection 10/13/2017   ROM (right otitis media) 01/23/2013   S/P insertion of IVC (inferior vena caval) filter 05/08/2005   [place] (non-retrievable)  /  dx 2019  a leg of filter is protruding thru the vena cava in to right L2 vertebral body (09-13-2018  per pt having surgery to remove filter in [place])   S/P radiofrequency ablation operation for arrhythmia 1996   1996  and 1997,   SVT and Atrial Fib   Sinusitis, chronic 01/26/2016   SMALL BOWEL OBSTRUCTION, HX OF 08/07/2008   Annotation: obstruction w/ adhesions led to partial colectomy Qualifier: Diagnosis of  By: Craige Cotta     Stroke [facility])    [place] 10/29/2020, TIAs in the past   Swelling of hand 09/17/2012   Syncope    Tachycardia 02/03/2010   Qualifier: Diagnosis of  By: Via LPN, Lynn     Thyroid disease    Phreesia 10/29/2020   Ulcer 12/15/2011   Urinary frequency 07/18/2014   Vaginitis and vulvovaginitis 05/10/2013   Vitamin D deficiency 05/05/2015    Wears glasses     Medications:  Medications Prior to Admission  Medication Sig Dispense Refill Last Dose   ALPRAZolam (XANAX) 1 MG tablet Take 1 tablet (1 mg total) by mouth 2 (two) times daily. 60 tablet 0 06/02/2022   aspirin 81 MG chewable tablet Chew 81 mg by mouth daily.  06/02/2022   BIOTIN PO Take 1 capsule by mouth daily.   Past Month   butalbital-acetaminophen-caffeine (FIORICET) 50-325-40 MG tablet Take 1 tablet by mouth every 6 (six) hours as needed.   unknown   conjugated estrogens (PREMARIN) vaginal cream Place 1 Applicatorful (1 application. total) vaginally daily. 42.5 g 0 Past Week   cyanocobalamin (,VITAMIN B-12,) 1000 MCG/ML injection Inject 1 mL (1,000 mcg total) into the muscle every 30 (thirty) days. 1 mL 11 unknown   cyclobenzaprine (FLEXERIL) 10 MG tablet TAKE 1 TABLET BY MOUTH TWICE DAILY AS NEEDED FOR MUSCLE SPASMS (Patient taking differently: Take 10 mg by mouth 2 (two) times daily.) 180 tablet 0 06/02/2022   cycloSPORINE (RESTASIS) 0.05 % ophthalmic emulsion Place 1 drop into both eyes 2 (two) times daily as needed (dry eyes).   06/02/2022   DULoxetine (CYMBALTA) 60 MG capsule Take 60 mg by mouth daily.   06/02/2022   erythromycin (ERY-TAB) 250 MG EC tablet TAKE 1 TABLET BY MOUTH THREE TIMES DAILY BEFORE MEALS (Patient taking differently: 250 mg every evening.) 90 tablet 2 Past Week   ezetimibe (ZETIA) 10 MG tablet Take 1 tablet (10 mg total) by mouth daily. (Patient taking differently: Take 10 mg by mouth at bedtime.) 90 tablet 3 Past Week   fluticasone (FLONASE) 50 MCG/ACT nasal spray Place 2 sprays into both nostrils daily as needed for allergies.   unknown   HYDROmorphone (DILAUDID) 4 MG tablet Take 4 mg by mouth 3 (three) times daily.   06/02/2022   levothyroxine (SYNTHROID) 125 MCG tablet TAKE 1 TABLET(125 MCG) BY MOUTH DAILY BEFORE BREAKFAST (Patient taking differently: Take 125 mcg by mouth daily before breakfast.) 90 tablet 1 06/02/2022   lubiprostone (AMITIZA) 24 MCG  capsule TAKE 1 CAPSULE(24 MCG) BY MOUTH TWICE DAILY WITH A MEAL 60 capsule 3 06/02/2022   magnesium oxide (MAG-OX) 400 (240 Mg) MG tablet Take 1 tablet (400 mg total) by mouth 2 (two) times daily. Please make overdue appt with Dr. Caryl Comes before anymore refills. Thank you 1st attempt 60 tablet 0 Past Week   meclizine (ANTIVERT) 25 MG tablet Take by mouth.   unknown   metFORMIN (GLUCOPHAGE-XR) 500 MG 24 hr tablet Take 500 mg by mouth every evening.   Past Month   Multiple Vitamins-Minerals (ICAPS AREDS 2 PO) Take 1 capsule by mouth every evening.   Past Week   omeprazole (PRILOSEC) 40 MG capsule Take 1 capsule (40 mg total) by mouth in the morning and at bedtime. 180 capsule 3 06/02/2022   potassium chloride (KLOR-CON M) 10 MEQ tablet TAKE 1 TABLET BY MOUTH EVERY DAY 90 tablet 0 Past Week   pravastatin (PRAVACHOL) 40 MG tablet TAKE 1 TABLET(40 MG) BY MOUTH DAILY (Patient taking differently: Take 40 mg by mouth at bedtime.) 90 tablet 3 Past Week   SUPER B COMPLEX/C PO Take 1 capsule by mouth daily.   Past Month   XARELTO 20 MG TABS tablet TAKE 1 TABLET(20 MG) BY MOUTH DAILY 30 tablet 1 06/02/2022    Assessment: Asked to dose Lovenox for PE, Pt cannot take Xarelto. CBC WNL   Goal of Therapy:  Anti-Xa level 0.6-1 units/ml 4hrs after LMWH dose given Monitor platelets by anticoagulation protocol: Yes   Plan:  Lovenox '1mg'$ /Kg ('80mg'$ ) SQ q12hrs Monitor CBC, s/sx bleeding complications  Donna Christen Laury Huizar 06/06/2022,9:14 AM

## 2022-06-06 NOTE — Progress Notes (Signed)
PROGRESS NOTE    [name]  MOQ:[id number] DOB: 1961-11-01 DOA: 06/03/2022 PCP: Fayrene Helper, MD    Brief Narrative:  61 year old female with complicated abdominal history who has been seen at [facility] as well as [facility], presents to the emergency room with complaints of vomiting, abdominal pain as well as frequent stools.  CT of the abdomen indicated possible partial bowel obstruction.  She feels that her abdominal pain is different than her usual pain.  Seen by general surgery I did not feel to have an indication for surgery at this time.  GI also consulted.  Continues to have abdominal pain and loose stools at this time.   Assessment & Plan:   Principal Problem:   Abdominal pain Active Problems:   Hypothyroidism   Hyperlipidemia LDL goal <100   Iron deficiency anemia   Anxiety state   Fibromyalgia   Hx SBO   Chronic pain syndrome   GERD (gastroesophageal reflux disease)   B12 deficiency   Essential hypertension   History of DVT (deep vein thrombosis)   Partial small bowel obstruction (H[facility])   Nausea, vomiting, abdominal pain -Seen by general surgery and films reviewed with radiology.  It was felt that patient did not have signs of SBO on CT and was clinically not obstructed -She continues to have loose bowel movements -GI following -Checking stool studies for any underlying infectious process (C. difficile negative) -Also concerned that she may have some component of SIBO, started on course of Flagyl -She is continue on erythromycin for gastroparesis -Advancing diet as per GI -We will add Bentyl   Hypomagnesemia/hypokalemia -Replaced  Chronic pain/fibromyalgia/opiate dependence -Chronically on Dilaudid -She also takes Cymbalta  History of DVT/PE -Chronically on Xarelto -Currently on full dose Lovenox  Hypothyroidism -Continue Synthroid  Anxiety -Continue on Xanax   DVT prophylaxis:   Therapeutic Lovenox  Code Status: Full  code Family Communication: Husband at bedside Disposition Plan: Status is: Inpatient Remains inpatient appropriate because: Continued abdominal pain and frequent loose stools     Consultants:  General surgery Gastroenterology  Procedures:    Antimicrobials:      Subjective: Continues to have abdominal pain.  Feels that diarrhea may be mildly better today.  P.o. intake remains poor.  Objective: Vitals:   06/04/22 2151 06/05/22 0351 06/05/22 1219 06/05/22 1952  BP: 91/60 (!) 90/58 96/63 (!) 125/55  Pulse: 79 74 74 75  Resp: '14 15 18 18  '$ Temp: 98.5 F (36.9 C) 98.2 F (36.8 C) 98 F (36.7 C) 98.9 F (37.2 C)  TempSrc: Oral Oral Oral   SpO2: 100% 94% 99% 97%  Weight:      Height:        Intake/Output Summary (Last 24 hours) at 06/06/2022 1845 Last data filed at 06/06/2022 1507 Gross per 24 hour  Intake 4751.19 ml  Output --  Net 4751.19 ml   Filed Weights   06/03/22 1103 06/03/22 1104  Weight: 78.2 kg 75.8 kg    Examination:  General exam: Appears calm and comfortable  Respiratory system: Clear to auscultation. Respiratory effort normal. Cardiovascular system: S1 & S2 heard, RRR. No JVD, murmurs, rubs, gallops or clicks. No pedal edema. Gastrointestinal system: Abdomen is nondistended, soft and general tenderness. No organomegaly or masses felt. Normal bowel sounds heard. Central nervous system: Alert and oriented. No focal neurological deficits. Extremities: Symmetric 5 x 5 power. Skin: No rashes, lesions or ulcers Psychiatry: Judgement and insight appear normal. Mood & affect appropriate.  Data Reviewed: I have personally reviewed following labs and imaging studies  CBC: Recent Labs  Lab 06/03/22 1201 06/04/22 0548  WBC 2.2* 2.7*  NEUTROABS 1.5*  --   HGB 14.1 13.1  HCT 42.8 40.4  MCV 92.2 94.6  PLT 189 237   Basic Metabolic Panel: Recent Labs  Lab 06/03/22 1201 06/04/22 1201 06/06/22 0632  NA 139 136 137  K 3.6 3.9 3.3*  CL 103 104  112*  CO2 28 23 20*  GLUCOSE 102* 86 92  BUN '18 14 8  '$ CREATININE 0.79 0.67 0.56  CALCIUM 8.7* 8.3* 8.7*  MG 1.6* 1.9  --   PHOS 3.8  --   --    GFR: Estimated Creatinine Clearance: 74.5 mL/min (by C-G formula based on SCr of 0.56 mg/dL). Liver Function Tests: Recent Labs  Lab 06/03/22 1201  AST 47*  ALT 63*  ALKPHOS 137*  BILITOT 0.8  PROT 6.7  ALBUMIN 3.8   Recent Labs  Lab 06/03/22 1201  LIPASE 21   No results for input(s): "AMMONIA" in the last 168 hours. Coagulation Profile: No results for input(s): "INR", "PROTIME" in the last 168 hours. Cardiac Enzymes: No results for input(s): "CKTOTAL", "CKMB", "CKMBINDEX", "TROPONINI" in the last 168 hours. BNP (last 3 results) No results for input(s): "PROBNP" in the last 8760 hours. HbA1C: No results for input(s): "HGBA1C" in the last 72 hours. CBG: No results for input(s): "GLUCAP" in the last 168 hours. Lipid Profile: No results for input(s): "CHOL", "HDL", "LDLCALC", "TRIG", "CHOLHDL", "LDLDIRECT" in the last 72 hours. Thyroid Function Tests: No results for input(s): "TSH", "T4TOTAL", "FREET4", "T3FREE", "THYROIDAB" in the last 72 hours. Anemia Panel: No results for input(s): "VITAMINB12", "FOLATE", "FERRITIN", "TIBC", "IRON", "RETICCTPCT" in the last 72 hours. Sepsis Labs: No results for input(s): "PROCALCITON", "LATICACIDVEN" in the last 168 hours.  Recent Results (from the past 240 hour(s))  C Difficile Quick Screen w PCR reflex     Status: None   Collection Time: 06/05/22 11:43 AM   Specimen: STOOL  Result Value Ref Range Status   C Diff antigen NEGATIVE NEGATIVE Final   C Diff toxin NEGATIVE NEGATIVE Final   C Diff interpretation No C. difficile detected.  Final    Comment: Performed at [facility], [address], [place], [place] [address]  Gastrointestinal Panel by PCR , Stool     Status: Abnormal   Collection Time: 06/05/22 11:44 AM   Specimen: STOOL  Result Value Ref Range Status   Campylobacter  species NOT DETECTED NOT DETECTED Final   Plesimonas shigelloides NOT DETECTED NOT DETECTED Final   Salmonella species NOT DETECTED NOT DETECTED Final   Yersinia enterocolitica NOT DETECTED NOT DETECTED Final   Vibrio species NOT DETECTED NOT DETECTED Final   Vibrio cholerae NOT DETECTED NOT DETECTED Final   Enteroaggregative E coli (EAEC) NOT DETECTED NOT DETECTED Final   Enteropathogenic E coli (EPEC) NOT DETECTED NOT DETECTED Final   Enterotoxigenic E coli (ETEC) NOT DETECTED NOT DETECTED Final   Shiga like toxin producing E coli (STEC) NOT DETECTED NOT DETECTED Final   Shigella/Enteroinvasive E coli (EIEC) NOT DETECTED NOT DETECTED Final   Cryptosporidium NOT DETECTED NOT DETECTED Final   Cyclospora cayetanensis NOT DETECTED NOT DETECTED Final   Entamoeba histolytica NOT DETECTED NOT DETECTED Final   Giardia lamblia NOT DETECTED NOT DETECTED Final   Adenovirus F40/41 NOT DETECTED NOT DETECTED Final   Astrovirus DETECTED (A) NOT DETECTED Final   Norovirus GI/GII NOT DETECTED NOT DETECTED Final  Rotavirus A NOT DETECTED NOT DETECTED Final   Sapovirus (I, II, IV, and V) NOT DETECTED NOT DETECTED Final    Comment: Performed at [facility], [address] [place], [place] [address]         Radiology Studies: No results found.      Scheduled Meds:  ALPRAZolam  1 mg Oral BID   aspirin EC  81 mg Oral Daily   conjugated estrogens  1 Applicatorful Vaginal Daily   dicyclomine  20 mg Oral TID AC & HS   DULoxetine  60 mg Oral Daily   enoxaparin (LOVENOX) injection  80 mg Subcutaneous Q12H   erythromycin  125 mg Oral TID AC   levothyroxine  125 mcg Oral QAC breakfast   metroNIDAZOLE  500 mg Oral TID PC   pantoprazole (PROTONIX) IV  40 mg Intravenous Q12H   simethicone  80 mg Oral QID   Continuous Infusions:  lactated ringers 100 mL/hr at 06/06/22 1507     LOS: 2 days    Time spent: 19mns    JKathie Dike MD [facility]   If 7PM-7AM, please  contact night-coverage [email or web address]  06/06/2022, 6:45 PM

## 2022-06-07 DIAGNOSIS — F411 Generalized anxiety disorder: Secondary | ICD-10-CM | POA: Diagnosis not present

## 2022-06-07 DIAGNOSIS — R1084 Generalized abdominal pain: Secondary | ICD-10-CM | POA: Diagnosis not present

## 2022-06-07 DIAGNOSIS — G894 Chronic pain syndrome: Secondary | ICD-10-CM | POA: Diagnosis not present

## 2022-06-07 DIAGNOSIS — I10 Essential (primary) hypertension: Secondary | ICD-10-CM | POA: Diagnosis not present

## 2022-06-07 LAB — BASIC METABOLIC PANEL
Anion gap: 6 (ref 5–15)
BUN: 11 mg/dL (ref 6–20)
CO2: 19 mmol/L — ABNORMAL LOW (ref 22–32)
Calcium: 8.2 mg/dL — ABNORMAL LOW (ref 8.9–10.3)
Chloride: 114 mmol/L — ABNORMAL HIGH (ref 98–111)
Creatinine, Ser: 0.66 mg/dL (ref 0.44–1.00)
GFR, Estimated: >60 mL/min (ref >60)
Glucose, Bld: 87 mg/dL (ref 70–99)
Potassium: 3.6 mmol/L (ref 3.5–5.1)
Sodium: 139 mmol/L (ref 135–145)

## 2022-06-07 LAB — MAGNESIUM: Magnesium: 1.3 mg/dL — ABNORMAL LOW (ref 1.7–2.4)

## 2022-06-07 MED ORDER — HYDROMORPHONE HCL 2 MG PO TABS
4.0000 mg | ORAL_TABLET | ORAL | Status: DC | PRN
  Administered 2022-06-07 – 2022-06-08 (×8): 4 mg via ORAL
  Filled 2022-06-07 (×8): qty 2

## 2022-06-07 MED ORDER — HYDROMORPHONE HCL 1 MG/ML IJ SOLN
1.0000 mg | Freq: Four times a day (QID) | INTRAMUSCULAR | Status: DC | PRN
  Filled 2022-06-07: qty 1

## 2022-06-07 MED ORDER — PROCHLORPERAZINE EDISYLATE 10 MG/2ML IJ SOLN
10.0000 mg | Freq: Four times a day (QID) | INTRAMUSCULAR | Status: DC | PRN
  Administered 2022-06-07 – 2022-06-08 (×3): 10 mg via INTRAVENOUS
  Filled 2022-06-07 (×3): qty 2

## 2022-06-07 MED ORDER — MAGNESIUM SULFATE 4 GM/100ML IV SOLN
4.0000 g | Freq: Once | INTRAVENOUS | Status: AC
  Administered 2022-06-07: 4 g via INTRAVENOUS
  Filled 2022-06-07: qty 100

## 2022-06-07 MED ORDER — ESTROGENS CONJUGATED 0.625 MG/GM VA CREA
1.0000 | TOPICAL_CREAM | Freq: Every day | VAGINAL | Status: DC
  Filled 2022-06-07: qty 30

## 2022-06-07 MED ORDER — ACYCLOVIR 5 % EX OINT
TOPICAL_OINTMENT | CUTANEOUS | Status: DC
  Administered 2022-06-07: 1 via TOPICAL
  Filled 2022-06-07: qty 15

## 2022-06-07 MED ORDER — LUBIPROSTONE 24 MCG PO CAPS
24.0000 ug | ORAL_CAPSULE | Freq: Two times a day (BID) | ORAL | Status: DC
Start: 2022-06-08 — End: 2022-06-08
  Administered 2022-06-08: 24 ug via ORAL
  Filled 2022-06-07: qty 1

## 2022-06-07 NOTE — Progress Notes (Signed)
PROGRESS NOTE    [name]  OZH:[id number] DOB: [birth year] DOA: 06/03/2022 PCP: Fayrene Helper, MD    Brief Narrative:  61 year old female with complicated abdominal history who has been seen at [facility] as well as [facility], presents to the emergency room with complaints of vomiting, abdominal pain as well as frequent stools.  CT of the abdomen indicated possible partial bowel obstruction.  She feels that her abdominal pain is different than her usual pain.  Seen by general surgery I did not feel to have an indication for surgery at this time.  GI also consulted.  Continues to have abdominal pain and loose stools at this time.   Assessment & Plan:   Principal Problem:   Abdominal pain Active Problems:   Hypothyroidism   Hyperlipidemia LDL goal <100   Iron deficiency anemia   Anxiety state   Fibromyalgia   Hx SBO   Chronic pain syndrome   GERD (gastroesophageal reflux disease)   B12 deficiency   Essential hypertension   History of DVT (deep vein thrombosis)   Partial small bowel obstruction (HCC)   Nausea, vomiting, abdominal pain, diarrhea -Seen by general surgery and films reviewed with radiology.  It was felt that patient did not have signs of SBO on CT and was clinically not obstructed -GI following -Stool for C. difficile negative -GI pathogen panel positive for astrovirus which would explain her acute symptoms -Also concerned that she may have some component of SIBO, started on course of Flagyl -She is continue on erythromycin for gastroparesis -Reports that nausea is better controlled with Compazine as well as Zofran -Encouraged her to use more of the oral pain medicine as opposed to the IV   Hypomagnesemia/hypokalemia -Replaced  Chronic pain/fibromyalgia/opiate dependence -Chronically on Dilaudid -She also takes Cymbalta  History of DVT/PE -Chronically on Xarelto -Currently on full dose Lovenox  Hypothyroidism -Continue  Synthroid  Anxiety -Continue on Xanax   DVT prophylaxis:   Therapeutic Lovenox  Code Status: Full code Family Communication: Husband at bedside Disposition Plan: Status is: Inpatient Remains inpatient appropriate because: Continued abdominal pain and frequent loose stools     Consultants:  General surgery Gastroenterology  Procedures:    Antimicrobials:      Subjective: Reports having vomiting episodes overnight.  Had small bowel movement this morning.  Continues to have significant abdominal pain.  P.o. intake has remained poor.  Objective: Vitals:   06/05/22 1952 06/06/22 2300 06/07/22 0606 06/07/22 1425  BP: (!) 125/55 109/75 (!) 97/49 98/63  Pulse: 75 69 67 73  Resp: '18 19  16  '$ Temp: 98.9 F (37.2 C) 98.4 F (36.9 C) 97.9 F (36.6 C) 98.2 F (36.8 C)  TempSrc:  Oral Oral Oral  SpO2: 97% 95% 96% 98%  Weight:      Height:        Intake/Output Summary (Last 24 hours) at 06/07/2022 1828 Last data filed at 06/07/2022 1645 Gross per 24 hour  Intake 2144.75 ml  Output --  Net 2144.75 ml   Filed Weights   06/03/22 1103 06/03/22 1104  Weight: 78.2 kg 75.8 kg    Examination:  General exam: Appears calm and comfortable  Respiratory system: Clear to auscultation. Respiratory effort normal. Cardiovascular system: S1 & S2 heard, RRR. No JVD, murmurs, rubs, gallops or clicks. No pedal edema. Gastrointestinal system: Abdomen is nondistended, soft and general tenderness. No organomegaly or masses felt. Normal bowel sounds heard. Central nervous system: Alert and oriented. No focal neurological deficits. Extremities: Symmetric  5 x 5 power. Skin: No rashes, lesions or ulcers Psychiatry: Judgement and insight appear normal. Mood & affect appropriate.     Data Reviewed: I have personally reviewed following labs and imaging studies  CBC: Recent Labs  Lab 06/03/22 1201 06/04/22 0548  WBC 2.2* 2.7*  NEUTROABS 1.5*  --   HGB 14.1 13.1  HCT 42.8 40.4  MCV  92.2 94.6  PLT 189 710   Basic Metabolic Panel: Recent Labs  Lab 06/03/22 1201 06/04/22 1201 06/06/22 0632 06/07/22 0505  NA 139 136 137 139  K 3.6 3.9 3.3* 3.6  CL 103 104 112* 114*  CO2 28 23 20* 19*  GLUCOSE 102* 86 92 87  BUN '18 14 8 11  '$ CREATININE 0.79 0.67 0.56 0.66  CALCIUM 8.7* 8.3* 8.7* 8.2*  MG 1.6* 1.9  --  1.3*  PHOS 3.8  --   --   --    GFR: Estimated Creatinine Clearance: 74.5 mL/min (by C-G formula based on SCr of 0.66 mg/dL). Liver Function Tests: Recent Labs  Lab 06/03/22 1201  AST 47*  ALT 63*  ALKPHOS 137*  BILITOT 0.8  PROT 6.7  ALBUMIN 3.8   Recent Labs  Lab 06/03/22 1201  LIPASE 21   No results for input(s): "AMMONIA" in the last 168 hours. Coagulation Profile: No results for input(s): "INR", "PROTIME" in the last 168 hours. Cardiac Enzymes: No results for input(s): "CKTOTAL", "CKMB", "CKMBINDEX", "TROPONINI" in the last 168 hours. BNP (last 3 results) No results for input(s): "PROBNP" in the last 8760 hours. HbA1C: No results for input(s): "HGBA1C" in the last 72 hours. CBG: No results for input(s): "GLUCAP" in the last 168 hours. Lipid Profile: No results for input(s): "CHOL", "HDL", "LDLCALC", "TRIG", "CHOLHDL", "LDLDIRECT" in the last 72 hours. Thyroid Function Tests: No results for input(s): "TSH", "T4TOTAL", "FREET4", "T3FREE", "THYROIDAB" in the last 72 hours. Anemia Panel: No results for input(s): "VITAMINB12", "FOLATE", "FERRITIN", "TIBC", "IRON", "RETICCTPCT" in the last 72 hours. Sepsis Labs: No results for input(s): "PROCALCITON", "LATICACIDVEN" in the last 168 hours.  Recent Results (from the past 240 hour(s))  C Difficile Quick Screen w PCR reflex     Status: None   Collection Time: 06/05/22 11:43 AM   Specimen: STOOL  Result Value Ref Range Status   C Diff antigen NEGATIVE NEGATIVE Final   C Diff toxin NEGATIVE NEGATIVE Final   C Diff interpretation No C. difficile detected.  Final    Comment: Performed at [facility], [address] [place], [place] [address]  Gastrointestinal Panel by PCR , Stool     Status: Abnormal   Collection Time: 06/05/22 11:44 AM   Specimen: STOOL  Result Value Ref Range Status   Campylobacter species NOT DETECTED NOT DETECTED Final   Plesimonas shigelloides NOT DETECTED NOT DETECTED Final   Salmonella species NOT DETECTED NOT DETECTED Final   Yersinia enterocolitica NOT DETECTED NOT DETECTED Final   Vibrio species NOT DETECTED NOT DETECTED Final   Vibrio cholerae NOT DETECTED NOT DETECTED Final   Enteroaggregative E coli (EAEC) NOT DETECTED NOT DETECTED Final   Enteropathogenic E coli (EPEC) NOT DETECTED NOT DETECTED Final   Enterotoxigenic E coli (ETEC) NOT DETECTED NOT DETECTED Final   Shiga like toxin producing E coli (STEC) NOT DETECTED NOT DETECTED Final   Shigella/Enteroinvasive E coli (EIEC) NOT DETECTED NOT DETECTED Final   Cryptosporidium NOT DETECTED NOT DETECTED Final   Cyclospora cayetanensis NOT DETECTED NOT DETECTED Final   Entamoeba histolytica NOT DETECTED NOT  DETECTED Final   Giardia lamblia NOT DETECTED NOT DETECTED Final   Adenovirus F40/41 NOT DETECTED NOT DETECTED Final   Astrovirus DETECTED (A) NOT DETECTED Final   Norovirus GI/GII NOT DETECTED NOT DETECTED Final   Rotavirus A NOT DETECTED NOT DETECTED Final   Sapovirus (I, II, IV, and V) NOT DETECTED NOT DETECTED Final    Comment: Performed at [facility], [address], [place], [place] [address]         Radiology Studies: No results found.      Scheduled Meds:  acyclovir ointment   Topical Q3H   ALPRAZolam  1 mg Oral BID   aspirin EC  81 mg Oral Daily   [START ON 06/08/2022] conjugated estrogens  1 Applicatorful Vaginal QHS   DULoxetine  60 mg Oral Daily   enoxaparin (LOVENOX) injection  80 mg Subcutaneous Q12H   erythromycin  125 mg Oral TID AC   levothyroxine  125 mcg Oral QAC breakfast   [START ON 06/08/2022] lubiprostone  24 mcg Oral BID WC   metroNIDAZOLE   500 mg Oral TID PC   pantoprazole (PROTONIX) IV  40 mg Intravenous Q12H   Continuous Infusions:  lactated ringers 100 mL/hr at 06/07/22 1718     LOS: 3 days    Time spent: 14mns    JKathie Dike MD [facility]   If 7PM-7AM, please contact night-coverage [email or web address]  06/07/2022, 6:28 PM

## 2022-06-07 NOTE — Progress Notes (Signed)
Subjective:  Patient states she does not feel well.  She remains with generalized abdominal pain which is more pronounced in right lower quadrant and right mid abdomen.  She only had 1 small bowel movement today.  She is passing some flatus but not as much.  She vomited yesterday.  She remains with nausea.  She does not have an appetite.  She states Dr. Roderic Palau is trying to adjust her pain medication for better control.  Current Medications:  Current Facility-Administered Medications:    ALPRAZolam (XANAX) tablet 1 mg, 1 mg, Oral, BID, Elgergawy, Silver Huguenin, MD, 1 mg at 06/07/22 1013   aspirin EC tablet 81 mg, 81 mg, Oral, Daily, Memon, Jolaine Artist, MD, 81 mg at 06/07/22 1013   butalbital-acetaminophen-caffeine (FIORICET) 50-325-40 MG per tablet 1 tablet, 1 tablet, Oral, Q6H PRN, Elgergawy, Silver Huguenin, MD   conjugated estrogens (PREMARIN) vaginal cream 1 Applicatorful, 1 Applicatorful, Vaginal, Daily, Elgergawy, Silver Huguenin, MD, 1 Applicatorful at 10/13/50 1014   cyclobenzaprine (FLEXERIL) tablet 10 mg, 10 mg, Oral, BID PRN, Adefeso, Oladapo, DO, 10 mg at 06/06/22 2116   cycloSPORINE (RESTASIS) 0.05 % ophthalmic emulsion 1 drop, 1 drop, Both Eyes, BID PRN, Elgergawy, Silver Huguenin, MD   DULoxetine (CYMBALTA) DR capsule 60 mg, 60 mg, Oral, Daily, Elgergawy, Silver Huguenin, MD, 60 mg at 06/07/22 1013   enoxaparin (LOVENOX) injection 80 mg, 80 mg, Subcutaneous, Q12H, Hall, Scott A, RPH, 80 mg at 06/07/22 0505   erythromycin (E-MYCIN) tablet 125 mg, 125 mg, Oral, TID AC, Inga Noller U, MD, 125 mg at 06/07/22 1317   fluticasone (FLONASE) 50 MCG/ACT nasal spray 2 spray, 2 spray, Each Nare, Daily PRN, Elgergawy, Silver Huguenin, MD   HYDROmorphone (DILAUDID) injection 1 mg, 1 mg, Intravenous, Q6H PRN, Memon, Jehanzeb, MD   HYDROmorphone (DILAUDID) tablet 4 mg, 4 mg, Oral, Q3H PRN, Kathie Dike, MD, 4 mg at 06/07/22 1441   lactated ringers infusion, , Intravenous, Continuous, Elgergawy, Silver Huguenin, MD, Last Rate: 100 mL/hr at  06/06/22 1507, Infusion Verify at 06/06/22 1507   levothyroxine (SYNTHROID) tablet 125 mcg, 125 mcg, Oral, QAC breakfast, Elgergawy, Silver Huguenin, MD, 125 mcg at 06/07/22 0505   lip balm (CARMEX) ointment 1 application , 1 application , Topical, PRN, Memon, Jehanzeb, MD   magnesium sulfate IVPB 4 g 100 mL, 4 g, Intravenous, Once, Memon, Jolaine Artist, MD, Last Rate: 50 mL/hr at 06/07/22 1446, 4 g at 06/07/22 1446   metroNIDAZOLE (FLAGYL) tablet 500 mg, 500 mg, Oral, TID PC, Unika Nazareno U, MD, 500 mg at 06/07/22 1318   pantoprazole (PROTONIX) injection 40 mg, 40 mg, Intravenous, Q12H, Memon, Jehanzeb, MD, 40 mg at 06/07/22 1014   prochlorperazine (COMPAZINE) injection 10 mg, 10 mg, Intravenous, Q6H PRN, Adefeso, Oladapo, DO, 10 mg at 06/07/22 0050   Objective: Blood pressure 98/63, pulse 73, temperature 98.2 F (36.8 C), temperature source Oral, resp. rate 16, height $RemoveBe'5\' 4"'bwSvzSoYF$  (1.626 m), weight 75.8 kg, SpO2 98 %. Patient is awake and appears very anxious. Abdominal exam reveals multiple scars large mid abdominal hernia and visible loops of bowel.  Bowel sounds are normal.  On palpation abdomen is soft.  She has generalized tenderness which is more pronounced in right lower quadrant where she has extensive scarring.  There are some firmness.  No mass or organomegaly.  Labs/studies Results:      Latest Ref Rng & Units 06/04/2022    5:48 AM 06/03/2022   12:01 PM 05/12/2022    4:32 PM  CBC  WBC 4.0 -  10.5 K/uL 2.7  2.2  6.9   Hemoglobin 12.0 - 15.0 g/dL 13.1  14.1  13.5   Hematocrit 36.0 - 46.0 % 40.4  42.8  43.5   Platelets 150 - 400 K/uL 157  189  261        Latest Ref Rng & Units 06/07/2022    5:05 AM 06/06/2022    6:32 AM 06/04/2022   12:01 PM  CMP  Glucose 70 - 99 mg/dL 87  92  86   BUN 6 - 20 mg/dL _0 Creatinine 0.44 - 1.00 mg/dL 0.66  0.56  0.67   Sodium 135 - 145 mmol/L 139  137  136   Potassium 3.5 - 5.1 mmol/L 3.6  3.3  3.9   Chloride 98 - 111 mmol/L 114  112  104   CO2 22 - 32  mmol/L _1 Calcium 8.9 - 10.3 mg/dL 8.2  8.7  8.3        Latest Ref Rng & Units 06/03/2022   12:01 PM 05/12/2022    4:32 PM 04/02/2022    7:10 AM  Hepatic Function  Total Protein 6.5 - 8.1 g/dL 6.7  6.3  5.5   Albumin 3.5 - 5.0 g/dL 3.8  3.5  3.1   AST 15 - 41 U/L 47  27  38   ALT 0 - 44 U/L 63  41  77   Alk Phosphatase 38 - 126 U/L 137  131  106   Total Bilirubin 0.3 - 1.2 mg/dL 0.8  0.4  0.9      Stool studies  GI pathogen positive for astrovirus  Assessment:  #1.  Diarrhea.  Diarrhea has slowed or stopped.  GI pathogen panel reveals astrovirus which would explain her acute symptoms.  No therapy needed recommended for this viral infection which is self-limiting.  #2.  Gastroparesis.  Patient remains on erythromycin at a low dose.  #3.  Abdominal pain.  She has chronic abdominal pain possibly made worse with acute viral illness.  She is dependent on narcotics.  Unfortunately patient medication is further interfering with her already abnormal small bowel motility.   Patient is on metronidazole for presumed small intestinal bacterial overgrowth.  #4.  Leukopenia.  Leukopenia possibly due to viral infection.  Repeat CBC in a.m.  #5.  History of pulmonary embolism.  Patient is on Xarelto.  Plan:  Agree with resuming Amitiza starting tomorrow morning. Continue p.o. erythromycin at current dose. We will leave her on metronidazole for at least 10 days.

## 2022-06-08 DIAGNOSIS — E538 Deficiency of other specified B group vitamins: Secondary | ICD-10-CM

## 2022-06-08 LAB — CBC
HCT: 35.6 % — ABNORMAL LOW (ref 36.0–46.0)
Hemoglobin: 11.8 g/dL — ABNORMAL LOW (ref 12.0–15.0)
MCH: 30 pg (ref 26.0–34.0)
MCHC: 33.1 g/dL (ref 30.0–36.0)
MCV: 90.6 fL (ref 80.0–100.0)
Platelets: 177 10*3/uL (ref 150–400)
RBC: 3.93 MIL/uL (ref 3.87–5.11)
RDW: 13.9 % (ref 11.5–15.5)
WBC: 5 10*3/uL (ref 4.0–10.5)
nRBC: 0 % (ref 0.0–0.2)

## 2022-06-08 LAB — BASIC METABOLIC PANEL
Anion gap: 3 — ABNORMAL LOW (ref 5–15)
BUN: 9 mg/dL (ref 6–20)
CO2: 24 mmol/L (ref 22–32)
Calcium: 7.9 mg/dL — ABNORMAL LOW (ref 8.9–10.3)
Chloride: 112 mmol/L — ABNORMAL HIGH (ref 98–111)
Creatinine, Ser: 0.61 mg/dL (ref 0.44–1.00)
GFR, Estimated: >60 mL/min (ref >60)
Glucose, Bld: 86 mg/dL (ref 70–99)
Potassium: 3.6 mmol/L (ref 3.5–5.1)
Sodium: 139 mmol/L (ref 135–145)

## 2022-06-08 LAB — MAGNESIUM: Magnesium: 1.7 mg/dL (ref 1.7–2.4)

## 2022-06-08 MED ORDER — METRONIDAZOLE 500 MG PO TABS: 500.0000 mg | ORAL_TABLET | Freq: Three times a day (TID) | ORAL | 0 refills | Status: AC

## 2022-06-08 MED ORDER — PROMETHAZINE HCL 12.5 MG PO TABS: 12.5000 mg | ORAL_TABLET | Freq: Four times a day (QID) | ORAL | 0 refills | Status: DC | PRN

## 2022-06-08 NOTE — Progress Notes (Signed)
Ng Discharge Note  Admit Date:  06/03/2022 Discharge date: 06/08/2022   Adilee Lemme to be D/C'd Home per MD order.  AVS completed. Patient/caregiver able to verbalize understanding.  Discharge Medication: Allergies as of 06/08/2022       Reactions   Nitrofurantoin Hives   Crestor [rosuvastatin Calcium] Other (See Comments)   Generalized cramps   Bisacodyl Other (See Comments)   Makes patient feel like she is having cramps   Clarithromycin Hives, Other (See Comments)   Cluster migraines   Clindamycin Hcl Hives   Codeine Itching   Iron Sucrose Other (See Comments)   Flushing- required benadryl and solu medrol   Monosodium Glutamate Other (See Comments)   Cluster migraines   Scopolamine Hbr Other (See Comments)   Cluster migraines, impaired vision   Other Hives, Itching, Swelling, Rash   Cigarette smoke        Medication List     TAKE these medications    ALPRAZolam 1 MG tablet Commonly known as: XANAX Take 1 tablet (1 mg total) by mouth 2 (two) times daily.   aspirin 81 MG chewable tablet Chew 81 mg by mouth daily.   BIOTIN PO Take 1 capsule by mouth daily.   butalbital-acetaminophen-caffeine 50-325-40 MG tablet Commonly known as: FIORICET Take 1 tablet by mouth every 6 (six) hours as needed.   cyanocobalamin 1000 MCG/ML injection Commonly known as: (VITAMIN B-12) Inject 1 mL (1,000 mcg total) into the muscle every 30 (thirty) days.   cyclobenzaprine 10 MG tablet Commonly known as: FLEXERIL TAKE 1 TABLET BY MOUTH TWICE DAILY AS NEEDED FOR MUSCLE SPASMS What changed: when to take this   cycloSPORINE 0.05 % ophthalmic emulsion Commonly known as: RESTASIS Place 1 drop into both eyes 2 (two) times daily as needed (dry eyes).   DULoxetine 60 MG capsule Commonly known as: CYMBALTA Take 60 mg by mouth daily.   erythromycin 250 MG EC tablet Commonly known as: ERY-TAB TAKE 1 TABLET BY MOUTH THREE TIMES DAILY BEFORE MEALS What changed: See the new  instructions.   ezetimibe 10 MG tablet Commonly known as: Zetia Take 1 tablet (10 mg total) by mouth daily. What changed: when to take this   fluticasone 50 MCG/ACT nasal spray Commonly known as: FLONASE Place 2 sprays into both nostrils daily as needed for allergies.   HYDROmorphone 4 MG tablet Commonly known as: DILAUDID Take 4 mg by mouth 3 (three) times daily.   ICAPS AREDS 2 PO Take 1 capsule by mouth every evening.   levothyroxine 125 MCG tablet Commonly known as: SYNTHROID TAKE 1 TABLET(125 MCG) BY MOUTH DAILY BEFORE BREAKFAST What changed:  how much to take how to take this when to take this additional instructions   lubiprostone 24 MCG capsule Commonly known as: AMITIZA TAKE 1 CAPSULE(24 MCG) BY MOUTH TWICE DAILY WITH A MEAL   magnesium oxide 400 (240 Mg) MG tablet Commonly known as: MAG-OX Take 1 tablet (400 mg total) by mouth 2 (two) times daily. Please make overdue appt with Dr. Caryl Comes before anymore refills. Thank you 1st attempt   meclizine 25 MG tablet Commonly known as: ANTIVERT Take by mouth.   metFORMIN 500 MG 24 hr tablet Commonly known as: GLUCOPHAGE-XR Take 500 mg by mouth every evening.   metroNIDAZOLE 500 MG tablet Commonly known as: FLAGYL Take 1 tablet (500 mg total) by mouth 3 (three) times daily after meals for 10 days.   omeprazole 40 MG capsule Commonly known as: PRILOSEC Take 1 capsule (40 mg  total) by mouth in the morning and at bedtime.   potassium chloride 10 MEQ tablet Commonly known as: KLOR-CON M TAKE 1 TABLET BY MOUTH EVERY DAY   pravastatin 40 MG tablet Commonly known as: PRAVACHOL TAKE 1 TABLET(40 MG) BY MOUTH DAILY What changed: See the new instructions.   Premarin vaginal cream Generic drug: conjugated estrogens Place 1 Applicatorful (1 application. total) vaginally daily.   promethazine 12.5 MG tablet Commonly known as: PHENERGAN Take 1 tablet (12.5 mg total) by mouth every 6 (six) hours as needed for nausea or  vomiting.   SUPER B COMPLEX/C PO Take 1 capsule by mouth daily.   Xarelto 20 MG Tabs tablet Generic drug: rivaroxaban TAKE 1 TABLET(20 MG) BY MOUTH DAILY        Discharge Assessment: Vitals:   06/08/22 0504 06/08/22 1414  BP: (!) 95/55 92/63  Pulse: 72 83  Resp: 19 16  Temp: 98 F (36.7 C) 98.6 F (37 C)  SpO2: 96% 97%   Skin clean, dry and intact without evidence of skin break down, no evidence of skin tears noted. IV catheter discontinued intact. Site without signs and symptoms of complications - no redness or edema noted at insertion site, patient denies c/o pain - only slight tenderness at site.  Dressing with slight pressure applied.  D/c Instructions-Education: Discharge instructions given to patient/family with verbalized understanding. D/c education completed with patient/family including follow up instructions, medication list, d/c activities limitations if indicated, with other d/c instructions as indicated by MD - patient able to verbalize understanding, all questions fully answered. Patient instructed to return to ED, call 911, or call MD for any changes in condition.  Patient escorted via [place], and D/C home via private auto.  Tsosie Billing, LPN [id number] 5:64 PM

## 2022-06-08 NOTE — Progress Notes (Signed)
Gastroenterology Progress Note    Primary Care Physician:  Fayrene Helper, MD Primary Gastroenterologist:  Dr. Jenetta Downer   Patient ID: [name]; [id number]; October 20, 1961    Subjective   Worsening nausea. Vomiting this morning after eating eggs. Abdominal pain different. Remains distended. Diarrhea resolved. She has had a BM this morning. Amitiza started with first dose this morning.   Objective   Vital signs in last 24 hours Temp:  [98 F (36.7 C)-98.4 F (36.9 C)] 98 F (36.7 C) (06/12 0504) Pulse Rate:  [72-74] 72 (06/12 0504) Resp:  [16-19] 19 (06/12 0504) BP: (95-101)/(55-64) 95/55 (06/12 0504) SpO2:  [96 %-98 %] 96 % (06/12 0504) Last BM Date : 06/07/22  Physical Exam General:   Alert and oriented, chronically ill-appearing Head:  Normocephalic and atraumatic. Abdomen:  Bowel sounds present, large ventral hernia, TTP diffusely, remains moderately distended Extremities:  Without edema. Neurologic:  Alert and  oriented x4   Intake/Output from previous day: 06/11 0701 - 06/12 0700 In: 3297.8 [P.O.:360; I.V.:2837.8; IV Piggyback:100] Out: -  Intake/Output this shift: Total I/O In: 240 [P.O.:240] Out: -   Lab Results  Recent Labs    06/08/22 0455  WBC 5.0  HGB 11.8*  HCT 35.6*  PLT 177   BMET Recent Labs    06/06/22 0632 06/07/22 0505 06/08/22 0455  NA 137 139 139  K 3.3* 3.6 3.6  CL 112* 114* 112*  CO2 20* 19* 24  GLUCOSE 92 87 86  BUN '8 11 9  '$ CREATININE 0.56 0.66 0.61  CALCIUM 8.7* 8.2* 7.9*    Studies/Results CT ABDOMEN PELVIS W CONTRAST  Result Date: 06/03/2022 CLINICAL DATA:  Right lower quadrant abdominal pain, nausea and vomiting since last night. History of multiple abdominal surgeries and bowel obstruction. EXAM: CT ABDOMEN AND PELVIS WITH CONTRAST TECHNIQUE: Multidetector CT imaging of the abdomen and pelvis was performed using the standard protocol following bolus administration of intravenous contrast. RADIATION DOSE  REDUCTION: This exam was performed according to the departmental dose-optimization program which includes automated exposure control, adjustment of the mA and/or kV according to patient size and/or use of iterative reconstruction technique. CONTRAST:  154m OMNIPAQUE IOHEXOL 300 MG/ML  SOLN COMPARISON:  03/28/2022 CT abdomen/pelvis. FINDINGS: Lower chest: No acute abnormality at the lung bases. Partially visualized right breast prosthesis. Hepatobiliary: Normal liver size. No liver mass. Cholecystectomy. Bile ducts are stable and within normal post cholecystectomy limits with CBD diameter 8 mm. Pancreas: Normal, with no mass or duct dilation. Spleen: Normal size. No mass. Adrenals/Urinary Tract: Normal adrenals. Normal kidneys with no hydronephrosis and no renal mass. Normal collapsed bladder. Stomach/Bowel: Normal non-distended stomach. Redemonstrated multiple enteroenterostomy suture lines from prior small bowel resections in the central and right abdomen. Redemonstrated subtotal colectomy with intact appearing entero-colonic anastomosis in the central lower abdomen. Chronic dilatation of multiple small bowel loops in the right abdomen up to 4.3 cm diameter, with associated scattered air-fluid levels, not substantially changed from 03/28/2022 CT. No small bowel wall thickening. Proximal small bowel loops are nondistended. Fluid throughout the remnant rectum and large bowel. No colorectal wall thickening. Vascular/Lymphatic: Normal caliber abdominal aorta. Patent portal, splenic, hepatic and renal veins. No pathologically enlarged lymph nodes in the abdomen or pelvis. Reproductive: Status post hysterectomy, with no abnormal findings at the vaginal cuff. No adnexal mass. Other: No pneumoperitoneum, ascites or focal fluid collection. Musculoskeletal: No aggressive appearing focal osseous lesions. Mild thoracolumbar spondylosis. IMPRESSION: Chronic dilatation of multiple small bowel loops in the right abdomen with  associated scattered air-fluid levels, not substantially changed from 03/28/2022 CT. Fluid throughout the remnant distal colon and rectum, suggesting a nonspecific malabsorptive state. A partial mechanical distal small bowel obstruction cannot be excluded, particularly given the absence of oral contrast on this scan. Findings may alternatively represent chronic postsurgical changes or adynamic ileus. No bowel wall thickening. Electronically Signed   By: Ilona Sorrel M.D.   On: 06/03/2022 14:23    Assessment/PLAN  61 y.o. female with a complicated past GI history including partial colectomy due to redundant colon with primary anastomosis,  perforated bowel r/t gynecologic procedure complicated by enterocutaneous fistula that required multiple surgeries and multiple SBOs, gastroparesis, presenting again with N/V/D and abdominal pain. Concern has remained for underlying small bowel stricture but not documented on imaging studies, concern for small bowel dysmotility, and SIBO. CT scan with chronic dilatation of multiple small bowel loops not substantially changed from April 2023.   Diarrhea: resolved. She actually had a BM this morning. Amitiza started this morning.   Chronic abdominal pain: limited interventions at this point. Needs to follow-up with [facility] as outpatient. Needs judicious use of narcotics, as this is contributing to dysmotility. Continue on metronidazole to finish a 10 day course for SIBO.  N/V: vomiting this morning after eggs. Continue erythromycin. Continue PPI IV BID. Compazine prn.   Limited therapies available. Dr. Laural Golden to reach out to [facility] regarding evaluation for small bowel transplant evaluation. If she were to have worsening pain, recommend imaging. After discussion with Dr. Roderic Palau, appears her abdominal exam is at baseline.     LOS: 4 days    06/08/2022, 10:49 AM  [name], PhD, ANP-BC [facility] Gastroenterology

## 2022-06-08 NOTE — Care Management Important Message (Signed)
Important Message  Patient Details  Name: [name] MRN: [id number] Date of Birth: 1961-03-17   Medicare Important Message Given:  Yes (spoke with spouse Lanae Boast at [phone] to review info on letter, expressed understanding, no additonal copy needed.)     Tommy Medal 06/08/2022, 3:41 PM

## 2022-06-08 NOTE — Discharge Summary (Signed)
Physician Discharge Summary  [name] JIR:[id number] DOB: May 29, 1961 DOA: 06/03/2022  PCP: Fayrene Helper, MD  Admit date: 06/03/2022 Discharge date: 06/08/2022  Admitted From: Home Disposition: Home  Recommendations for Outpatient Follow-up:  Follow up with PCP in 1-2 weeks Please obtain BMP/CBC in one week Follow follow-up with GI for further management of chronic abdominal pain, vomiting   Discharge Condition: Stable CODE STATUS: Full code Diet recommendation: Heart healthy  Brief/Interim Summary: 61 year old female with complicated abdominal history who has been seen at [facility] as well as [facility], presents to the emergency room with complaints of vomiting, abdominal pain as well as frequent stools.  CT of the abdomen indicated possible partial bowel obstruction.  She feels that her abdominal pain is different than her usual pain.  Seen by general surgery who did not feel that patient had an indication for surgery at this time.  GI also consulted.  Stool studies positive for astrovirus which was likely an acute component to her chronic symptoms.  Discharge Diagnoses:  Principal Problem:   Abdominal pain Active Problems:   Hypothyroidism   Hyperlipidemia LDL goal <100   Iron deficiency anemia   Anxiety state   Fibromyalgia   Hx SBO   Chronic pain syndrome   GERD (gastroesophageal reflux disease)   B12 deficiency   Essential hypertension   History of DVT (deep vein thrombosis)   Partial small bowel obstruction (HCC)  Nausea, vomiting, abdominal pain, diarrhea -Seen by general surgery and films reviewed with radiology.  It was felt that patient did not have signs of SBO on CT and was clinically not obstructed -GI following -Stool for C. difficile negative -GI pathogen panel positive for astrovirus which would explain her acute symptoms -Also concerned that she may have some component of SIBO, started on course of Flagyl -She is continue on  erythromycin for gastroparesis -Reports that nausea is better controlled with Compazine  -She is using oral pain medicine to manage her pain -She is tolerating a diet and she is having bowel movements -Her case will be reviewed by Dr. Laural Golden with the team at [facility] to be considered for small bowel transplant      Hypomagnesemia/hypokalemia -Replaced   Chronic pain/fibromyalgia/opiate dependence -Chronically on Dilaudid -She also takes Cymbalta   History of DVT/PE -Chronically on Xarelto -Currently on full dose Lovenox   Hypothyroidism -Continue Synthroid   Anxiety -Continue on Xanax  Discharge Instructions  Discharge Instructions     Diet - low sodium heart healthy   Complete by: As directed    Increase activity slowly   Complete by: As directed       Allergies as of 06/08/2022       Reactions   Nitrofurantoin Hives   Crestor [rosuvastatin Calcium] Other (See Comments)   Generalized cramps   Bisacodyl Other (See Comments)   Makes patient feel like she is having cramps   Clarithromycin Hives, Other (See Comments)   Cluster migraines   Clindamycin Hcl Hives   Codeine Itching   Iron Sucrose Other (See Comments)   Flushing- required benadryl and solu medrol   Monosodium Glutamate Other (See Comments)   Cluster migraines   Scopolamine Hbr Other (See Comments)   Cluster migraines, impaired vision   Other Hives, Itching, Swelling, Rash   Cigarette smoke        Medication List     TAKE these medications    ALPRAZolam 1 MG tablet Commonly known as: XANAX Take 1 tablet (1 mg  total) by mouth 2 (two) times daily.   aspirin 81 MG chewable tablet Chew 81 mg by mouth daily.   BIOTIN PO Take 1 capsule by mouth daily.   butalbital-acetaminophen-caffeine 50-325-40 MG tablet Commonly known as: FIORICET Take 1 tablet by mouth every 6 (six) hours as needed.   cyanocobalamin 1000 MCG/ML injection Commonly known as: (VITAMIN B-12) Inject 1 mL (1,000  mcg total) into the muscle every 30 (thirty) days.   cyclobenzaprine 10 MG tablet Commonly known as: FLEXERIL TAKE 1 TABLET BY MOUTH TWICE DAILY AS NEEDED FOR MUSCLE SPASMS What changed: when to take this   cycloSPORINE 0.05 % ophthalmic emulsion Commonly known as: RESTASIS Place 1 drop into both eyes 2 (two) times daily as needed (dry eyes).   DULoxetine 60 MG capsule Commonly known as: CYMBALTA Take 60 mg by mouth daily.   erythromycin 250 MG EC tablet Commonly known as: ERY-TAB TAKE 1 TABLET BY MOUTH THREE TIMES DAILY BEFORE MEALS What changed: See the new instructions.   ezetimibe 10 MG tablet Commonly known as: Zetia Take 1 tablet (10 mg total) by mouth daily. What changed: when to take this   fluticasone 50 MCG/ACT nasal spray Commonly known as: FLONASE Place 2 sprays into both nostrils daily as needed for allergies.   HYDROmorphone 4 MG tablet Commonly known as: DILAUDID Take 4 mg by mouth 3 (three) times daily.   ICAPS AREDS 2 PO Take 1 capsule by mouth every evening.   levothyroxine 125 MCG tablet Commonly known as: SYNTHROID TAKE 1 TABLET(125 MCG) BY MOUTH DAILY BEFORE BREAKFAST What changed:  how much to take how to take this when to take this additional instructions   lubiprostone 24 MCG capsule Commonly known as: AMITIZA TAKE 1 CAPSULE(24 MCG) BY MOUTH TWICE DAILY WITH A MEAL   magnesium oxide 400 (240 Mg) MG tablet Commonly known as: MAG-OX Take 1 tablet (400 mg total) by mouth 2 (two) times daily. Please make overdue appt with Dr. Caryl Comes before anymore refills. Thank you 1st attempt   meclizine 25 MG tablet Commonly known as: ANTIVERT Take by mouth.   metFORMIN 500 MG 24 hr tablet Commonly known as: GLUCOPHAGE-XR Take 500 mg by mouth every evening.   metroNIDAZOLE 500 MG tablet Commonly known as: FLAGYL Take 1 tablet (500 mg total) by mouth 3 (three) times daily after meals for 10 days.   omeprazole 40 MG capsule Commonly known as:  PRILOSEC Take 1 capsule (40 mg total) by mouth in the morning and at bedtime.   potassium chloride 10 MEQ tablet Commonly known as: KLOR-CON M TAKE 1 TABLET BY MOUTH EVERY DAY   pravastatin 40 MG tablet Commonly known as: PRAVACHOL TAKE 1 TABLET(40 MG) BY MOUTH DAILY What changed: See the new instructions.   Premarin vaginal cream Generic drug: conjugated estrogens Place 1 Applicatorful (1 application. total) vaginally daily.   promethazine 12.5 MG tablet Commonly known as: PHENERGAN Take 1 tablet (12.5 mg total) by mouth every 6 (six) hours as needed for nausea or vomiting.   SUPER B COMPLEX/C PO Take 1 capsule by mouth daily.   Xarelto 20 MG Tabs tablet Generic drug: rivaroxaban TAKE 1 TABLET(20 MG) BY MOUTH DAILY        Follow-up Information     Rehman, Mechele Dawley, MD Follow up.   Specialty: Gastroenterology Why: office will call you with follow up and let you know about [facility] transplant referral Contact information: [place], [place] [address] [phone]  Allergies  Allergen Reactions   Nitrofurantoin Hives   Crestor [Rosuvastatin Calcium] Other (See Comments)    Generalized cramps   Bisacodyl Other (See Comments)    Makes patient feel like she is having cramps    Clarithromycin Hives and Other (See Comments)    Cluster migraines   Clindamycin Hcl Hives   Codeine Itching   Iron Sucrose Other (See Comments)    Flushing- required benadryl and solu medrol   Monosodium Glutamate Other (See Comments)    Cluster migraines    Scopolamine Hbr Other (See Comments)    Cluster migraines, impaired vision   Other Hives, Itching, Swelling and Rash    Cigarette smoke    Consultations: Gastroenterology   Procedures/Studies: CT ABDOMEN PELVIS W CONTRAST  Result Date: 06/03/2022 CLINICAL DATA:  Right lower quadrant abdominal pain, nausea and vomiting since last night. History of multiple abdominal surgeries and bowel  obstruction. EXAM: CT ABDOMEN AND PELVIS WITH CONTRAST TECHNIQUE: Multidetector CT imaging of the abdomen and pelvis was performed using the standard protocol following bolus administration of intravenous contrast. RADIATION DOSE REDUCTION: This exam was performed according to the departmental dose-optimization program which includes automated exposure control, adjustment of the mA and/or kV according to patient size and/or use of iterative reconstruction technique. CONTRAST:  178m OMNIPAQUE IOHEXOL 300 MG/ML  SOLN COMPARISON:  03/28/2022 CT abdomen/pelvis. FINDINGS: Lower chest: No acute abnormality at the lung bases. Partially visualized right breast prosthesis. Hepatobiliary: Normal liver size. No liver mass. Cholecystectomy. Bile ducts are stable and within normal post cholecystectomy limits with CBD diameter 8 mm. Pancreas: Normal, with no mass or duct dilation. Spleen: Normal size. No mass. Adrenals/Urinary Tract: Normal adrenals. Normal kidneys with no hydronephrosis and no renal mass. Normal collapsed bladder. Stomach/Bowel: Normal non-distended stomach. Redemonstrated multiple enteroenterostomy suture lines from prior small bowel resections in the central and right abdomen. Redemonstrated subtotal colectomy with intact appearing entero-colonic anastomosis in the central lower abdomen. Chronic dilatation of multiple small bowel loops in the right abdomen up to 4.3 cm diameter, with associated scattered air-fluid levels, not substantially changed from 03/28/2022 CT. No small bowel wall thickening. Proximal small bowel loops are nondistended. Fluid throughout the remnant rectum and large bowel. No colorectal wall thickening. Vascular/Lymphatic: Normal caliber abdominal aorta. Patent portal, splenic, hepatic and renal veins. No pathologically enlarged lymph nodes in the abdomen or pelvis. Reproductive: Status post hysterectomy, with no abnormal findings at the vaginal cuff. No adnexal mass. Other: No  pneumoperitoneum, ascites or focal fluid collection. Musculoskeletal: No aggressive appearing focal osseous lesions. Mild thoracolumbar spondylosis. IMPRESSION: Chronic dilatation of multiple small bowel loops in the right abdomen with associated scattered air-fluid levels, not substantially changed from 03/28/2022 CT. Fluid throughout the remnant distal colon and rectum, suggesting a nonspecific malabsorptive state. A partial mechanical distal small bowel obstruction cannot be excluded, particularly given the absence of oral contrast on this scan. Findings may alternatively represent chronic postsurgical changes or adynamic ileus. No bowel wall thickening. Electronically Signed   By: JIlona SorrelM.D.   On: 06/03/2022 14:23      Subjective: Tolerated some soup last night.  She had a bowel movement this morning.  Continues to have abdominal pain but did not use IV pain medicine overnight.  Discharge Exam: Vitals:   06/07/22 1425 06/07/22 2145 06/08/22 0504 06/08/22 1414  BP: 98/63 101/64 (!) 95/55 92/63  Pulse: 73 74 72 83  Resp: '16 18 19 16  '$ Temp: 98.2 F (36.8 C) 98.4 F (36.9 C) 98  F (36.7 C) 98.6 F (37 C)  TempSrc: Oral Oral Oral Oral  SpO2: 98% 96% 96% 97%  Weight:      Height:        General: Pt is alert, awake, not in acute distress Cardiovascular: RRR, S1/S2 +, no rubs, no gallops Respiratory: CTA bilaterally, no wheezing, no rhonchi Abdominal: Soft, NT, distended, multiple healed surgical incisions, bowel sounds + Extremities: no edema, no cyanosis    The results of significant diagnostics from this hospitalization (including imaging, microbiology, ancillary and laboratory) are listed below for reference.     Microbiology: Recent Results (from the past 240 hour(s))  C Difficile Quick Screen w PCR reflex     Status: None   Collection Time: 06/05/22 11:43 AM   Specimen: STOOL  Result Value Ref Range Status   C Diff antigen NEGATIVE NEGATIVE Final   C Diff toxin  NEGATIVE NEGATIVE Final   C Diff interpretation No C. difficile detected.  Final    Comment: Performed at [facility], [address] [place], [place] [address]  Gastrointestinal Panel by PCR , Stool     Status: Abnormal   Collection Time: 06/05/22 11:44 AM   Specimen: STOOL  Result Value Ref Range Status   Campylobacter species NOT DETECTED NOT DETECTED Final   Plesimonas shigelloides NOT DETECTED NOT DETECTED Final   Salmonella species NOT DETECTED NOT DETECTED Final   Yersinia enterocolitica NOT DETECTED NOT DETECTED Final   Vibrio species NOT DETECTED NOT DETECTED Final   Vibrio cholerae NOT DETECTED NOT DETECTED Final   Enteroaggregative E coli (EAEC) NOT DETECTED NOT DETECTED Final   Enteropathogenic E coli (EPEC) NOT DETECTED NOT DETECTED Final   Enterotoxigenic E coli (ETEC) NOT DETECTED NOT DETECTED Final   Shiga like toxin producing E coli (STEC) NOT DETECTED NOT DETECTED Final   Shigella/Enteroinvasive E coli (EIEC) NOT DETECTED NOT DETECTED Final   Cryptosporidium NOT DETECTED NOT DETECTED Final   Cyclospora cayetanensis NOT DETECTED NOT DETECTED Final   Entamoeba histolytica NOT DETECTED NOT DETECTED Final   Giardia lamblia NOT DETECTED NOT DETECTED Final   Adenovirus F40/41 NOT DETECTED NOT DETECTED Final   Astrovirus DETECTED (A) NOT DETECTED Final   Norovirus GI/GII NOT DETECTED NOT DETECTED Final   Rotavirus A NOT DETECTED NOT DETECTED Final   Sapovirus (I, II, IV, and V) NOT DETECTED NOT DETECTED Final    Comment: Performed at [facility], [address] [place], [place] [address]     Labs: BNP (last 3 results) Recent Labs    03/28/22 1642  BNP 06.2   Basic Metabolic Panel: Recent Labs  Lab 06/03/22 1201 06/04/22 1201 06/06/22 0632 06/07/22 0505 06/08/22 0455  NA 139 136 137 139 139  K 3.6 3.9 3.3* 3.6 3.6  CL 103 104 112* 114* 112*  CO2 28 23 20* 19* 24  GLUCOSE 102* 86 92 87 86  BUN '18 14 8 11 9  '$ CREATININE 0.79 0.67 0.56 0.66 0.61   CALCIUM 8.7* 8.3* 8.7* 8.2* 7.9*  MG 1.6* 1.9  --  1.3* 1.7  PHOS 3.8  --   --   --   --    Liver Function Tests: Recent Labs  Lab 06/03/22 1201  AST 47*  ALT 63*  ALKPHOS 137*  BILITOT 0.8  PROT 6.7  ALBUMIN 3.8   Recent Labs  Lab 06/03/22 1201  LIPASE 21   No results for input(s): "AMMONIA" in the last 168 hours. CBC: Recent Labs  Lab 06/03/22  1201 06/04/22 0548 06/08/22 0455  WBC 2.2* 2.7* 5.0  NEUTROABS 1.5*  --   --   HGB 14.1 13.1 11.8*  HCT 42.8 40.4 35.6*  MCV 92.2 94.6 90.6  PLT 189 157 177   Cardiac Enzymes: No results for input(s): "CKTOTAL", "CKMB", "CKMBINDEX", "TROPONINI" in the last 168 hours. BNP: Invalid input(s): "POCBNP" CBG: No results for input(s): "GLUCAP" in the last 168 hours. D-Dimer No results for input(s): "DDIMER" in the last 72 hours. Hgb A1c No results for input(s): "HGBA1C" in the last 72 hours. Lipid Profile No results for input(s): "CHOL", "HDL", "LDLCALC", "TRIG", "CHOLHDL", "LDLDIRECT" in the last 72 hours. Thyroid function studies No results for input(s): "TSH", "T4TOTAL", "T3FREE", "THYROIDAB" in the last 72 hours.  Invalid input(s): "FREET3" Anemia work up No results for input(s): "VITAMINB12", "FOLATE", "FERRITIN", "TIBC", "IRON", "RETICCTPCT" in the last 72 hours. Urinalysis    Component Value Date/Time   COLORURINE YELLOW 06/03/2022 1346   APPEARANCEUR CLEAR 06/03/2022 1346   LABSPEC 1.015 06/03/2022 1346   PHURINE 5.0 06/03/2022 1346   GLUCOSEU NEGATIVE 06/03/2022 1346   GLUCOSEU NEG mg/dL 09/05/2007 2337   HGBUR NEGATIVE 06/03/2022 1346   HGBUR negative 03/13/2011 0957   BILIRUBINUR NEGATIVE 06/03/2022 1346   BILIRUBINUR moderate (A) 05/12/2022 1515   BILIRUBINUR neg 06/23/2018 1434   KETONESUR 20 (A) 06/03/2022 1346   PROTEINUR NEGATIVE 06/03/2022 1346   UROBILINOGEN 2.0 (A) 05/12/2022 1515   UROBILINOGEN 0.2 07/03/2015 2014   NITRITE NEGATIVE 06/03/2022 1346   LEUKOCYTESUR NEGATIVE 06/03/2022 1346    Sepsis Labs Recent Labs  Lab 06/03/22 1201 06/04/22 0548 06/08/22 0455  WBC 2.2* 2.7* 5.0   Microbiology Recent Results (from the past 240 hour(s))  C Difficile Quick Screen w PCR reflex     Status: None   Collection Time: 06/05/22 11:43 AM   Specimen: STOOL  Result Value Ref Range Status   C Diff antigen NEGATIVE NEGATIVE Final   C Diff toxin NEGATIVE NEGATIVE Final   C Diff interpretation No C. difficile detected.  Final    Comment: Performed at [facility], [address], [place], [place] [address]  Gastrointestinal Panel by PCR , Stool     Status: Abnormal   Collection Time: 06/05/22 11:44 AM   Specimen: STOOL  Result Value Ref Range Status   Campylobacter species NOT DETECTED NOT DETECTED Final   Plesimonas shigelloides NOT DETECTED NOT DETECTED Final   Salmonella species NOT DETECTED NOT DETECTED Final   Yersinia enterocolitica NOT DETECTED NOT DETECTED Final   Vibrio species NOT DETECTED NOT DETECTED Final   Vibrio cholerae NOT DETECTED NOT DETECTED Final   Enteroaggregative E coli (EAEC) NOT DETECTED NOT DETECTED Final   Enteropathogenic E coli (EPEC) NOT DETECTED NOT DETECTED Final   Enterotoxigenic E coli (ETEC) NOT DETECTED NOT DETECTED Final   Shiga like toxin producing E coli (STEC) NOT DETECTED NOT DETECTED Final   Shigella/Enteroinvasive E coli (EIEC) NOT DETECTED NOT DETECTED Final   Cryptosporidium NOT DETECTED NOT DETECTED Final   Cyclospora cayetanensis NOT DETECTED NOT DETECTED Final   Entamoeba histolytica NOT DETECTED NOT DETECTED Final   Giardia lamblia NOT DETECTED NOT DETECTED Final   Adenovirus F40/41 NOT DETECTED NOT DETECTED Final   Astrovirus DETECTED (A) NOT DETECTED Final   Norovirus GI/GII NOT DETECTED NOT DETECTED Final   Rotavirus A NOT DETECTED NOT DETECTED Final   Sapovirus (I, II, IV, and V) NOT DETECTED NOT DETECTED Final    Comment: Performed at [facility], [address], [place], [place] [address]     Time  coordinating discharge: 85mns  SIGNED:   JKathie Dike MD  [facility] 06/08/2022, 7:54 PM   If 7PM-7AM, please contact night-coverage [email or web address]

## 2022-06-09 ENCOUNTER — Other Ambulatory Visit: Payer: Medicare Other

## 2022-06-09 ENCOUNTER — Telehealth: Payer: Self-pay

## 2022-06-09 NOTE — Telephone Encounter (Signed)
Transition Care Management Follow-up Telephone Call Date of discharge and from where: [place] 06/08/22 How have you been since you were released from the hospital? hurting Any questions or concerns? No  Items Reviewed: Did the pt receive and understand the discharge instructions provided? Yes  Medications obtained and verified? Yes  Other?  N/a Any new allergies since your discharge? No  Dietary orders reviewed? Yes Do you have support at home? Yes   Home Care and Equipment/Supplies: Were home health services ordered? no If so, what is the name of the agency? N/a  Has the agency set up a time to come to the patient's home? not applicable Were any new equipment or medical supplies ordered?  No What is the name of the medical supply agency? N/a Were you able to get the supplies/equipment? N/a Do you have any questions related to the use of the equipment or supplies? No  Functional Questionnaire: (I = Independent and D = Dependent) ADLs: i  Bathing/Dressing- i  Meal Prep- i  Eating- i  Maintaining continence- i  Transferring/Ambulation- i  Managing Meds- i  Follow up appointments reviewed:  PCP Hospital f/u appt confirmed? Yes  Scheduled to see [facility] on 06/12/22 @ 4:00pm. [facility] f/u appt confirmed? No   Are transportation arrangements needed? No  If their condition worsens, is the pt aware to call PCP or go to the Emergency Dept.? Yes Was the patient provided with contact information for the PCP's office or ED? Yes Was to pt encouraged to call back with questions or concerns? Yes

## 2022-06-12 ENCOUNTER — Inpatient Hospital Stay: Payer: Medicare Other | Admitting: Family Medicine

## 2022-06-15 ENCOUNTER — Other Ambulatory Visit: Payer: Self-pay | Admitting: Family Medicine

## 2022-06-15 DIAGNOSIS — N644 Mastodynia: Secondary | ICD-10-CM

## 2022-06-17 ENCOUNTER — Inpatient Hospital Stay: Payer: Medicare Other | Admitting: Family Medicine

## 2022-06-19 ENCOUNTER — Ambulatory Visit: Payer: Medicare Other | Admitting: Physician Assistant

## 2022-06-19 NOTE — Progress Notes (Deleted)
Cardiology Office Note:    Date:  06/19/2022   ID:  [name], DOB [birth year], MRN [id number]  PCP:  Fayrene Helper, MD  [facility] Cardiologist:  Werner Lean, MD  [facility] Electrophysiologist:  Virl Axe, MD   Chief Complaint: hospital follow up  History of Present Illness:    [name] is a 61 y.o. 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   Negative LHC in 1990s.  Negative CCTA 2020.  Patient has an Iron deficiency anemia and vitamin B12 deficiency secondary to GI malabsorption due to multiple GI surgery.  She was having chest pain when last seen by Dr. Gasper Sells 01/2022. Follow up CT coronary without evidence of CAD and calcium score of 0. Monitor 02/2022 without evidence of arrhthymias.   Most recently admitted 6/7 to 6/12 after presenting with abdominal pain, nausea, vomiting and diarrhea.  CT of abdomen with possible small bowel obstruction but ruled out by general surgery after reviewing films with radiology.  GI pathogen positive for enterovirus the likely culprit of her symptoms.  Plan to follow-up with [facility].  Here today for follow-up.   Past Medical History:  Diagnosis Date   Abdominal hernia 08/14/2019   Abdominal pain 06/06/2019   Acute bronchitis 05/01/2013   Acute cystitis 06/09/2010   Qualifier: Diagnosis of  By: Cori Razor LPN, Brandi     Acute renal insufficiency 08/28/2011   Allergy    Phreesia 03/16/2021   Anemia    Phreesia 10/29/2020   Anxiety    Anxiety state 03/16/2008   Qualifier: Diagnosis of  By: Dierdre Harness     Arthritis    Phreesia 10/29/2020   Back pain with radiation 07/17/2014   Bilateral hand pain 07/27/2016   Blood transfusion without reported diagnosis    Phreesia 10/29/2020   Breast cancer ([place])    L  breast- 2006   Cancer ([place])    Phreesia 10/29/2020   Carpal tunnel syndrome    Chronic constipation    Chronic pain syndrome    followed by [facility]---  back   Clotting disorder ([place])    Phreesia 10/29/2020   Depression    Dermatitis 12/15/2011   Difficult intubation    Educated about COVID-19 virus infection 05/14/2019   Fall at home 01/26/2016   Family history of adverse reaction to anesthesia    MOTHER--- PONV   Fatigue 09/17/2012   Fibromyalgia    FIBROMYALGIA 03/16/2008   Qualifier: Diagnosis of  By: Dierdre Harness     GERD (gastroesophageal reflux disease)    Headache disorder 01/16/2013   Headache syndrome 01/26/2016   Hip pain, right 07/08/2019   History of MRSA infection    lip abscess   History of ovarian cyst 06/2011   s/p  BSO   History of pulmonary embolus (PE) 1997   post EP with ablation pulmonary veouns for SVT/ Atrial Fib.   History of supraventricular tachycardia    s/p  ablation 1996  and 1997  by dr Caryl Comes   History of TIA (transient ischemic attack) 1997   post op EP ablation PE   Hyperlipidemia    Hyperlipidemia LDL goal <100 03/16/2008   Qualifier: Diagnosis of  By: Bullins, Luann     Hypothyroidism    followed by  pcp   Incisional hernia    Insomnia 12/15/2011   Intermittent palpitations 08/22/2017   Interstitial cystitis    09-13-2018   per pt last flare-up  May 2019 (followed by pcp)   INTERSTITIAL CYSTITIS 02/17/2011   Qualifier: Diagnosis of  By: Moshe Cipro MD, Margaret     Iron deficiency anemia    09-13-2018  PER PT STABLE   Irregular heart rate 07/25/2013   Lipoma of back    upper   Low back pain radiating to right leg 07/04/2019   Low ferritin level 06/14/2014   MDD (major depressive disorder), single episode, in full remission ([place]) 10/27/2017   Medically noncompliant 02/28/2015   Multiple missed appointments, both follow-up appointments and lab appointments.    Metabolic syndrome X 06/20/7627   Qualifier: Diagnosis of  By: Moshe Cipro MD,  Margaret  hBA1c is 5.8 in 02/2013    Migraines    Morbid obesity ([place]) 03/27/2013   Muscle spasm 01/03/2020   Nausea alone 07/17/2014   NECK PAIN, CHRONIC 03/16/2008   Qualifier: Diagnosis of  By: Dierdre Harness     Normal coronary arteries    a. by CT 12/2018.   Obesity 03/16/2008   Qualifier: Diagnosis of  By: Dierdre Harness     Oral ulceration 08/23/2014   Presented at 06/14/2014 visit    Other malaise and fatigue 03/16/2008   Centricity Description: FATIGUE, CHRONIC Qualifier: Diagnosis of  By: Dierdre Harness   Centricity Description: FATIGUE Qualifier: Diagnosis of  By: Claybon Jabs PA, Dawn     OVARIAN CYST 12/26/2009   Qualifier: Diagnosis of  By: Moshe Cipro MD, Margaret     Paget disease of breast, left [facility])    Paget's disease of breast, left ([place]) 03/16/2008   Qualifier: Diagnosis of  By: Dierdre Harness  Left diagnosed in 2006 F/h breast cancer x 15 family members   Partial small bowel obstruction ([place]) 07/04/2012   PONV (postoperative nausea and vomiting)    SEVERE   Postsurgical menopause 11/13/2011   Presence of IVC filter 06/06/2019   PSVT (paroxysmal supraventricular tachycardia) ([place])    [place]   Recurrent oral herpes simplex infection 10/13/2017   ROM (right otitis media) 01/23/2013   S/P insertion of IVC (inferior vena caval) filter 05/08/2005   [place] (non-retrievable)  /  dx 2019  a leg of filter is protruding thru the vena cava in to right L2 vertebral body (09-13-2018  per pt having surgery to remove filter in [place])   S/P radiofrequency ablation operation for arrhythmia 1996   1996  and 1997,   SVT and Atrial Fib   Sinusitis, chronic 01/26/2016   SMALL BOWEL OBSTRUCTION, HX OF 08/07/2008   Annotation: obstruction w/ adhesions led to partial colectomy Qualifier: Diagnosis of  By: Craige Cotta     Stroke [facility])    [place] 10/29/2020, TIAs in the past   Swelling of hand 09/17/2012   Syncope    Tachycardia 02/03/2010   Qualifier: Diagnosis of  By: Via LPN,  Lynn     Thyroid disease    Phreesia 10/29/2020   Ulcer 12/15/2011   Urinary frequency 07/18/2014   Vaginitis and vulvovaginitis 05/10/2013   Vitamin D deficiency 05/05/2015   Wears glasses     Past Surgical History:  Procedure Laterality Date   ABDOMINAL HYSTERECTOMY  1987   ANTERIOR CERVICAL DECOMP/DISCECTOMY FUSION  03-07-2002   dr elsner  '@[facility]'$    C 4 -- 5   APPENDECTOMY  1980   AUGMENTATION MAMMAPLASTY Right  2006   BILATERAL SALPINGOOPHORECTOMY  07/24/2011   via [employer] Lap. w/ intraoperative perf. bowel repair   BIOPSY  05/02/2021   Procedure: BIOPSY;  Surgeon: Montez Morita, Quillian Quince, MD;  [facility];  Service: Gastroenterology;;  small bowel, mid esophagus, distal esophagus, random colon biopsies   BIOPSY  12/04/2021   Procedure: BIOPSY;  Surgeon: [name], DO;  [facility];  Service: Endoscopy;;   BIOPSY  03/31/2022   Procedure: BIOPSY;  Surgeon: Harvel Quale, MD;  [facility];  Service: Gastroenterology;;   BOTOX INJECTION N/A 03/31/2022   Procedure: BOTOX INJECTION;  Surgeon: Harvel Quale, MD;  [facility];  Service: Gastroenterology;  Laterality: N/A;   BREAST BIOPSY Right 2019   benign   BREAST ENHANCEMENT SURGERY Bilateral 1993   BREAST IMPLANT REMOVAL Bilateral    BREAST SURGERY N/A    Phreesia 10/29/2020   CARDIAC CATHETERIZATION  07-06-2003   dr Darnell Level brodie   normal coronaries and LVF   CARDIAC ELECTROPHYSIOLOGY STUDY AND ABLATION  1996  and 1997   CARDIOVASCULAR STRESS TEST  11-18-2015   dr Caryl Comes   normal nuclear study w/ no ischemia/  normal LV function and wall motion , ef 84%   CARPAL TUNNEL RELEASE Right ?   CESAREAN SECTION  1986   CESAREAN SECTION N/A    Phreesia 10/29/2020   CHOLECYSTECTOMY N/A    Phreesia 10/29/2020   COLON SURGERY     COLONOSCOPY WITH PROPOFOL N/A 05/02/2021   Procedure: COLONOSCOPY WITH PROPOFOL;  Surgeon: Harvel Quale, MD;  [facility];  Service: Gastroenterology;  Laterality: N/A;  12:30 PM   CYSTO/  HYDRODISTENTION/  INSTILATION THERAPY  MULTIPLE   ENTEROCUTANEOUS FISTULA CLOSURE  multiple   last one 2015 with small bowel resection   ESOPHAGOGASTRODUODENOSCOPY (EGD) WITH PROPOFOL N/A 05/02/2021   Procedure: ESOPHAGOGASTRODUODENOSCOPY (EGD) WITH PROPOFOL;  Surgeon: Harvel Quale, MD;  [facility];  Service: Gastroenterology;  Laterality: N/A;   ESOPHAGOGASTRODUODENOSCOPY (EGD) WITH PROPOFOL N/A 12/04/2021   Procedure: ESOPHAGOGASTRODUODENOSCOPY (EGD) WITH PROPOFOL;  Surgeon: [name], DO;  [facility];  Service: Endoscopy;  Laterality: N/A;   ESOPHAGOGASTRODUODENOSCOPY (EGD) WITH PROPOFOL N/A 03/31/2022   Procedure: ESOPHAGOGASTRODUODENOSCOPY (EGD) WITH PROPOFOL;  Surgeon: Harvel Quale, MD;  [facility];  Service: Gastroenterology;  Laterality: N/A;  Parkin / RESECTION SMALL BOWEL  11-09-2014   '@[facility]'$    FRACTURE SURGERY N/A    Phreesia 10/29/2020   JOINT REPLACEMENT N/A    Phreesia 10/29/2020   LIPOMA EXCISION Right 09/16/2018   Procedure: EXCISION LIPOMA UPPER BACK;  Surgeon: Clovis Riley, MD;  Location: [place];  Service: General;  Laterality: Right;   MASTECTOMY Left 2006   w/ reconstruction on left (paget's disease)  and right breast augmentation   POLYPECTOMY  05/02/2021   Procedure: POLYPECTOMY;  Surgeon: Harvel Quale, MD;  [facility];  Service: Gastroenterology;;  gastric   POLYPECTOMY  03/31/2022   Procedure: POLYPECTOMY;  Surgeon: Harvel Quale, MD;  [facility];  Service: Gastroenterology;;   Azzie Almas DILATION  05/02/2021   Procedure: Azzie Almas DILATION;  Surgeon: Harvel Quale, MD;  [facility];  Service: Gastroenterology;;   SMALL INTESTINE SURGERY N/A    Phreesia 10/29/2020   SPINE SURGERY N/A     Phreesia 10/29/2020   TOTAL COLECTOMY  08-04-2002    @  [facility]   AND CHOLECYSTECTOMY  (colonic inertia)   TRANSTHORACIC ECHOCARDIOGRAM  02/04/2016   ef 60-65%,  grade 2 diastolic dysfunction/  mild MR   TUBAL LIGATION N/A    Phreesia 03/16/2021   VENA CAVA FILTER PLACEMENT  05/08/2005   '@[facility]'$    [place] (non-retrievable)   WIDE EXCISION PERIRECTAL ABSCESSES  09-22-2005   @ [facility]    Current Medications: No outpatient medications have been marked as taking for the 06/19/22 encounter (Appointment) with Leanor Kail, [place].     Allergies:   Nitrofurantoin, Crestor [rosuvastatin calcium], Bisacodyl, Clarithromycin, Clindamycin hcl, Codeine, Iron sucrose, Monosodium glutamate, Scopolamine hbr, and Other   Social History   Socioeconomic History   Marital status: Married    Spouse name: Not on file   Number of children: Not on file   Years of education: Not on file   Highest education level: Not on file  Occupational History   Not on file  Tobacco Use   Smoking status: Never   Smokeless tobacco: Never  Vaping Use   Vaping Use: Never used  Substance and Sexual Activity   Alcohol use: No    Comment: twice per year   Drug use: No   Sexual activity: Yes    Birth control/protection: Surgical  Other Topics Concern   Not on file  Social History Narrative   Not on file   Social Determinants of Health   Financial Resource Strain: Medium Risk (05/14/2021)   Overall Financial Resource Strain (CARDIA)    Difficulty of Paying Living Expenses: Somewhat hard  Food Insecurity: No Food Insecurity (05/14/2021)   Hunger Vital Sign    Worried About Running Out of Food in the Last Year: Never true    Ran Out of Food in the Last Year: Never true  Transportation Needs: No Transportation Needs (05/14/2021)   PRAPARE - Hydrologist (Medical): No    Lack of Transportation (Non-Medical): No  Physical Activity: Insufficiently Active (05/14/2021)   Exercise Vital Sign     Days of Exercise per Week: 7 days    Minutes of Exercise per Session: 20 min  Stress: No Stress Concern Present (05/14/2021)   [place]    Feeling of Stress : Not at all  Social Connections: Moderately Isolated (05/14/2021)   Social Connection and Isolation Panel [NHANES]    Frequency of Communication with Friends and Family: More than three times a week    Frequency of Social Gatherings with Friends and Family: More than three times a week    Attends Religious Services: Never    Marine scientist or Organizations: No    Attends Music therapist: Never    Marital Status: Married     Family History: [name] family history includes Alcohol abuse in her father; Bone cancer in her maternal grandfather; Brain cancer in her maternal aunt; Breast cancer in her cousin, maternal aunt, maternal aunt, and maternal aunt; Cancer in her maternal uncle; Colon cancer in her maternal aunt; Diabetes in her mother; Heart disease in her father and mother; Hyperlipidemia in her father; Hypertension in her father and mother; Ovarian cancer (age of onset: 39) in her cousin; Prostate cancer in her maternal uncle.  ***  ROS:   Please see the history of present illness.    All other systems reviewed and are negative. ***  EKGs/Labs/Other Studies Reviewed:    The following studies were reviewed today: Coronary CT 02/2022  IMPRESSION: 1. No evidence of CAD, CADRADS = 0.   2. Coronary calcium score of 0. This was 0 percentile for age and sex matched control.   3. Normal coronary origin with left dominance.   4. Dilated main pulmonary artery at 30 mm, suggestive of pulmonary hypertension.   5. Compared to prior CT coronary angiogram ON 01/11/2019, there has been no change.  Monitor 02/2022 Patient had a minimum heart rate of 65 bpm, maximum heart rate of 122 bpm, and average heart rate of 83 bpm. Predominant underlying  rhythm was sinus rhythm. Isolated PACs were rare (<1.0%). Isolated PVCs were rare (<1.0%). Triggered and diary events associated with sinus rhythm, sinus tachycardia, PACs, and PVCs.   No malignant arrhythmias.  EKG:  EKG is *** ordered today.  The ekg ordered today demonstrates ***  Recent Labs: 03/28/2022: B Natriuretic Peptide 11.0; TSH 1.282 06/03/2022: ALT 63 06/08/2022: BUN 9; Creatinine, Ser 0.61; Hemoglobin 11.8; Magnesium 1.7; Platelets 177; Potassium 3.6; Sodium 139  Recent Lipid Panel    Component Value Date/Time   CHOL 127 09/11/2021 1401   CHOL 146 01/15/2021 1540   TRIG 278 (H) 09/11/2021 1401   HDL 37 (L) 09/11/2021 1401   HDL 42 01/15/2021 1540   CHOLHDL 3.4 09/11/2021 1401   VLDL 56 (H) 09/11/2021 1401   LDLCALC 34 09/11/2021 1401   LDLCALC 60 01/15/2021 1540   LDLDIRECT 138 (H) 09/15/2012 1540     Risk Assessment/Calculations:   {Does this patient have ATRIAL FIBRILLATION?:[phone]}   Physical Exam:    VS:  There were no vitals taken for this visit.    Wt Readings from Last 3 Encounters:  06/03/22 167 lb (75.8 kg)  05/12/22 172 lb 7 oz (78.2 kg)  05/12/22 171 lb 1.9 oz (77.6 kg)     GEN: *** Well nourished, well developed in no acute distress HEENT: Normal NECK: No JVD; No carotid bruits LYMPHATICS: No lymphadenopathy CARDIAC: ***RRR, no murmurs, rubs, gallops RESPIRATORY:  Clear to auscultation without rales, wheezing or rhonchi  ABDOMEN: Soft, non-tender, non-distended MUSCULOSKELETAL:  No edema; No deformity  SKIN: Warm and dry NEUROLOGIC:  Alert and oriented x 3 PSYCHIATRIC:  Normal affect   ASSESSMENT AND PLAN:    Paroxysmal atrial fibrillation status post remote ablation Most recent monitor 02/2022 without evidence of arrhythmia. See below   2. Anticoagulation History of atrial fibrillation as well as history of unprovoked pulmonary embolism.  She is managed with Xarelto.  Per oncology note 05/08/2022  "Continue Xarelto.  Would consider  switching to Lovenox in the future if there is persistent concern for GI absorption of medications in the setting of GI motility issues and gastroparesis"  Medication Adjustments/Labs and Tests Ordered: Current medicines are reviewed at length with the patient today.  Concerns regarding medicines are outlined above.  No orders of the defined types were placed in this encounter.  No orders of the defined types were placed in this encounter.   There are no Patient Instructions on file for this visit.   Jarrett Soho, [place]  06/19/2022 12:54 PM    [facility]

## 2022-06-22 ENCOUNTER — Ambulatory Visit: Payer: Medicare Other

## 2022-06-22 ENCOUNTER — Ambulatory Visit
Admission: RE | Admit: 2022-06-22 | Discharge: 2022-06-22 | Disposition: A | Discharge status: Home / Self Care | Payer: Medicare Other | Source: Ambulatory Visit | Attending: Family Medicine | Admitting: Family Medicine

## 2022-06-22 DIAGNOSIS — N644 Mastodynia: Secondary | ICD-10-CM

## 2022-06-24 ENCOUNTER — Ambulatory Visit: Payer: Medicare Other | Admitting: Family Medicine

## 2022-06-25 DIAGNOSIS — Z79891 Long term (current) use of opiate analgesic: Secondary | ICD-10-CM | POA: Diagnosis not present

## 2022-06-25 DIAGNOSIS — R1084 Generalized abdominal pain: Secondary | ICD-10-CM | POA: Diagnosis not present

## 2022-06-25 DIAGNOSIS — M545 Low back pain, unspecified: Secondary | ICD-10-CM | POA: Diagnosis not present

## 2022-06-25 DIAGNOSIS — G43101 Migraine with aura, not intractable, with status migrainosus: Secondary | ICD-10-CM | POA: Diagnosis not present

## 2022-07-02 DIAGNOSIS — Z9049 Acquired absence of other specified parts of digestive tract: Secondary | ICD-10-CM | POA: Diagnosis not present

## 2022-07-02 DIAGNOSIS — K56600 Partial intestinal obstruction, unspecified as to cause: Secondary | ICD-10-CM | POA: Diagnosis not present

## 2022-07-02 DIAGNOSIS — K56609 Unspecified intestinal obstruction, unspecified as to partial versus complete obstruction: Secondary | ICD-10-CM | POA: Diagnosis not present

## 2022-07-06 ENCOUNTER — Encounter: Payer: Self-pay | Admitting: Family Medicine

## 2022-07-09 ENCOUNTER — Other Ambulatory Visit: Payer: Self-pay | Admitting: Family Medicine

## 2022-07-09 DIAGNOSIS — R790 Abnormal level of blood mineral: Secondary | ICD-10-CM

## 2022-07-09 DIAGNOSIS — E876 Hypokalemia: Secondary | ICD-10-CM

## 2022-07-09 DIAGNOSIS — I10 Essential (primary) hypertension: Secondary | ICD-10-CM

## 2022-07-10 LAB — BMP8+EGFR
BUN/Creatinine Ratio: 20 (ref 12–28)
BUN: 15 mg/dL (ref 8–27)
CO2: 20 mmol/L (ref 20–29)
Calcium: 9.6 mg/dL (ref 8.7–10.3)
Chloride: 104 mmol/L (ref 96–106)
Creatinine, Ser: 0.75 mg/dL (ref 0.57–1.00)
Glucose: 99 mg/dL (ref 70–99)
Potassium: 4.1 mmol/L (ref 3.5–5.2)
Sodium: 141 mmol/L (ref 134–144)
eGFR: 91 mL/min/{1.73_m2} (ref >59)

## 2022-07-10 LAB — MAGNESIUM: Magnesium: 1.5 mg/dL — ABNORMAL LOW (ref 1.6–2.3)

## 2022-07-16 ENCOUNTER — Ambulatory Visit: Payer: Medicare Other | Admitting: Family Medicine

## 2022-07-17 ENCOUNTER — Telehealth (INDEPENDENT_AMBULATORY_CARE_PROVIDER_SITE_OTHER): Payer: Self-pay | Admitting: *Deleted

## 2022-07-17 NOTE — Telephone Encounter (Signed)
Fax from [employer] requesting refill on erythromycin '250mg'$  one tid before meals. #90.  Pt had EGD on 03/31/22

## 2022-07-18 ENCOUNTER — Other Ambulatory Visit (INDEPENDENT_AMBULATORY_CARE_PROVIDER_SITE_OTHER): Payer: Self-pay | Admitting: Gastroenterology

## 2022-07-18 DIAGNOSIS — K3184 Gastroparesis: Secondary | ICD-10-CM

## 2022-07-18 MED ORDER — ERYTHROMYCIN BASE 250 MG PO TBEC: 250.0000 mg | DELAYED_RELEASE_TABLET | Freq: Three times a day (TID) | ORAL | 2 refills | Status: DC

## 2022-07-18 NOTE — Telephone Encounter (Signed)
Medication sent to pharmacy  

## 2022-07-22 ENCOUNTER — Encounter: Payer: Self-pay | Admitting: Family Medicine

## 2022-07-22 ENCOUNTER — Ambulatory Visit (INDEPENDENT_AMBULATORY_CARE_PROVIDER_SITE_OTHER): Payer: Medicare Other | Admitting: Family Medicine

## 2022-07-22 ENCOUNTER — Other Ambulatory Visit (HOSPITAL_COMMUNITY): Payer: Self-pay | Admitting: *Deleted

## 2022-07-22 ENCOUNTER — Inpatient Hospital Stay (HOSPITAL_COMMUNITY): Payer: Medicare Other | Attending: Physician Assistant

## 2022-07-22 VITALS — BP 110/70 | HR 91 | Ht 64.0 in | Wt 172.1 lb

## 2022-07-22 DIAGNOSIS — E038 Other specified hypothyroidism: Secondary | ICD-10-CM | POA: Diagnosis not present

## 2022-07-22 DIAGNOSIS — M791 Myalgia, unspecified site: Secondary | ICD-10-CM

## 2022-07-22 DIAGNOSIS — R1084 Generalized abdominal pain: Secondary | ICD-10-CM

## 2022-07-22 DIAGNOSIS — E559 Vitamin D deficiency, unspecified: Secondary | ICD-10-CM

## 2022-07-22 DIAGNOSIS — D509 Iron deficiency anemia, unspecified: Secondary | ICD-10-CM | POA: Diagnosis not present

## 2022-07-22 DIAGNOSIS — G4489 Other headache syndrome: Secondary | ICD-10-CM

## 2022-07-22 DIAGNOSIS — E876 Hypokalemia: Secondary | ICD-10-CM

## 2022-07-22 DIAGNOSIS — E785 Hyperlipidemia, unspecified: Secondary | ICD-10-CM | POA: Diagnosis not present

## 2022-07-22 DIAGNOSIS — F411 Generalized anxiety disorder: Secondary | ICD-10-CM | POA: Diagnosis not present

## 2022-07-22 DIAGNOSIS — K5989 Other specified functional intestinal disorders: Secondary | ICD-10-CM

## 2022-07-22 LAB — CBC WITH DIFFERENTIAL/PLATELET
Abs Immature Granulocytes: 0.02 10*3/uL (ref 0.00–0.07)
Basophils Absolute: 0 10*3/uL (ref 0.0–0.1)
Basophils Relative: 1 %
Eosinophils Absolute: 0.1 10*3/uL (ref 0.0–0.5)
Eosinophils Relative: 2 %
HCT: 41 % (ref 36.0–46.0)
Hemoglobin: 13.7 g/dL (ref 12.0–15.0)
Immature Granulocytes: 0 %
Lymphocytes Relative: 30 %
Lymphs Abs: 2 10*3/uL (ref 0.7–4.0)
MCH: 30.6 pg (ref 26.0–34.0)
MCHC: 33.4 g/dL (ref 30.0–36.0)
MCV: 91.5 fL (ref 80.0–100.0)
Monocytes Absolute: 0.4 10*3/uL (ref 0.1–1.0)
Monocytes Relative: 6 %
Neutro Abs: 4 10*3/uL (ref 1.7–7.7)
Neutrophils Relative %: 61 %
Platelets: 211 10*3/uL (ref 150–400)
RBC: 4.48 MIL/uL (ref 3.87–5.11)
RDW: 13.3 % (ref 11.5–15.5)
WBC: 6.6 10*3/uL (ref 4.0–10.5)
nRBC: 0 % (ref 0.0–0.2)

## 2022-07-22 LAB — IRON AND TIBC
Iron: 94 ug/dL (ref 28–170)
Saturation Ratios: 30 % (ref 10.4–31.8)
TIBC: 315 ug/dL (ref 250–450)
UIBC: 221 ug/dL

## 2022-07-22 LAB — FERRITIN: Ferritin: 148 ng/mL (ref 11–307)

## 2022-07-22 MED ORDER — MAGNESIUM 400 MG PO CAPS: ORAL_CAPSULE | ORAL | 3 refills | Status: DC

## 2022-07-22 NOTE — Patient Instructions (Addendum)
F/U in 4 months, call if you need me before  TSH, lipid, cmp and EGFr, MG level today  Best with procedures  I will refill med for 4 months  Thanks for choosing [facility], we consider it a privelige to serve you.

## 2022-07-23 ENCOUNTER — Other Ambulatory Visit: Payer: Self-pay | Admitting: Family Medicine

## 2022-07-23 DIAGNOSIS — Z86711 Personal history of pulmonary embolism: Secondary | ICD-10-CM

## 2022-07-23 LAB — CMP14+EGFR
ALT: 45 IU/L — ABNORMAL HIGH (ref 0–32)
AST: 30 IU/L (ref 0–40)
Albumin/Globulin Ratio: 1.8 (ref 1.2–2.2)
Albumin: 3.9 g/dL (ref 3.8–4.9)
Alkaline Phosphatase: 156 IU/L — ABNORMAL HIGH (ref 44–121)
BUN/Creatinine Ratio: 23 (ref 12–28)
BUN: 15 mg/dL (ref 8–27)
Bilirubin Total: 0.3 mg/dL (ref 0.0–1.2)
CO2: 19 mmol/L — ABNORMAL LOW (ref 20–29)
Calcium: 9.4 mg/dL (ref 8.7–10.3)
Chloride: 103 mmol/L (ref 96–106)
Creatinine, Ser: 0.66 mg/dL (ref 0.57–1.00)
Globulin, Total: 2.2 g/dL (ref 1.5–4.5)
Glucose: 63 mg/dL — ABNORMAL LOW (ref 70–99)
Potassium: 4.2 mmol/L (ref 3.5–5.2)
Sodium: 143 mmol/L (ref 134–144)
Total Protein: 6.1 g/dL (ref 6.0–8.5)
eGFR: 100 mL/min/{1.73_m2} (ref >59)

## 2022-07-23 LAB — LIPID PANEL
Chol/HDL Ratio: 2.2 ratio (ref 0.0–4.4)
Cholesterol, Total: 72 mg/dL — ABNORMAL LOW (ref 100–199)
HDL: 33 mg/dL — ABNORMAL LOW (ref >39)
LDL Chol Calc (NIH): 14 mg/dL (ref 0–99)
Triglycerides: 147 mg/dL (ref 0–149)
VLDL Cholesterol Cal: 25 mg/dL (ref 5–40)

## 2022-07-23 LAB — MAGNESIUM: Magnesium: 1.6 mg/dL (ref 1.6–2.3)

## 2022-07-23 LAB — TSH: TSH: 1.55 u[IU]/mL (ref 0.450–4.500)

## 2022-07-23 MED ORDER — PRAVASTATIN SODIUM 20 MG PO TABS: 20.0000 mg | ORAL_TABLET | Freq: Every day | ORAL | 1 refills | Status: DC

## 2022-07-23 NOTE — Progress Notes (Signed)
Dose reduction in pravachol

## 2022-07-24 ENCOUNTER — Ambulatory Visit (INDEPENDENT_AMBULATORY_CARE_PROVIDER_SITE_OTHER): Payer: Medicare Other

## 2022-07-24 ENCOUNTER — Encounter: Payer: Self-pay | Admitting: Family Medicine

## 2022-07-24 DIAGNOSIS — Z0000 Encounter for general adult medical examination without abnormal findings: Secondary | ICD-10-CM | POA: Diagnosis not present

## 2022-07-24 MED ORDER — ALPRAZOLAM 1 MG PO TABS: 1.0000 mg | ORAL_TABLET | Freq: Two times a day (BID) | ORAL | 4 refills | Status: DC

## 2022-07-24 NOTE — Assessment & Plan Note (Signed)
Currently overcorrected and holding supplement

## 2022-07-24 NOTE — Patient Instructions (Signed)
Ms. Bissette , Thank you for taking time to come for your Medicare Wellness Visit. I appreciate your ongoing commitment to your health goals. Please review the following plan we discussed and let me know if I can assist you in the future.   Screening recommendations/referrals: Colonoscopy: Completed Mammogram: Completed Recommended yearly ophthalmology/optometry visit for glaucoma screening and checkup Recommended yearly dental visit for hygiene and checkup  Vaccinations: Influenza vaccine: completed Tdap vaccine: completed Shingles vaccine: completed    Advanced directives: copy already in chart  Conditions/risks identified: falls  Next appointment: 1 year  Preventive Care 40-64 Years, Female Preventive care refers to lifestyle choices and visits with your health care provider that can promote health and wellness. What does preventive care include? A yearly physical exam. This is also called an annual well check. Dental exams once or twice a year. Routine eye exams. Ask your health care provider how often you should have your eyes checked. Personal lifestyle choices, including: Daily care of your teeth and gums. Regular physical activity. Eating a healthy diet. Avoiding tobacco and drug use. Limiting alcohol use. Practicing safe sex. Taking low-dose aspirin daily starting at age 70. Taking vitamin and mineral supplements as recommended by your health care provider. What happens during an annual well check? The services and screenings done by your health care provider during your annual well check will depend on your age, overall health, lifestyle risk factors, and family history of disease. Counseling  Your health care provider may ask you questions about your: Alcohol use. Tobacco use. Drug use. Emotional well-being. Home and relationship well-being. Sexual activity. Eating habits. Work and work Statistician. Method of birth control. Menstrual cycle. Pregnancy  history. Screening  You may have the following tests or measurements: Height, weight, and BMI. Blood pressure. Lipid and cholesterol levels. These may be checked every 5 years, or more frequently if you are over 56 years old. Skin check. Lung cancer screening. You may have this screening every year starting at age 59 if you have a 30-pack-year history of smoking and currently smoke or have quit within the past 15 years. Fecal occult blood test (FOBT) of the stool. You may have this test every year starting at age 65. Flexible sigmoidoscopy or colonoscopy. You may have a sigmoidoscopy every 5 years or a colonoscopy every 10 years starting at age 48. Hepatitis C blood test. Hepatitis B blood test. Sexually transmitted disease (STD) testing. Diabetes screening. This is done by checking your blood sugar (glucose) after you have not eaten for a while (fasting). You may have this done every 1-3 years. Mammogram. This may be done every 1-2 years. Talk to your health care provider about when you should start having regular mammograms. This may depend on whether you have a family history of breast cancer. BRCA-related cancer screening. This may be done if you have a family history of breast, ovarian, tubal, or peritoneal cancers. Pelvic exam and Pap test. This may be done every 3 years starting at age 59. Starting at age 62, this may be done every 5 years if you have a Pap test in combination with an HPV test. Bone density scan. This is done to screen for osteoporosis. You may have this scan if you are at high risk for osteoporosis. Discuss your test results, treatment options, and if necessary, the need for more tests with your health care provider. Vaccines  Your health care provider may recommend certain vaccines, such as: Influenza vaccine. This is recommended every year. Tetanus, diphtheria,  and acellular pertussis (Tdap, Td) vaccine. You may need a Td booster every 10 years. Zoster vaccine. You  may need this after age 2. Pneumococcal 13-valent conjugate (PCV13) vaccine. You may need this if you have certain conditions and were not previously vaccinated. Pneumococcal polysaccharide (PPSV23) vaccine. You may need one or two doses if you smoke cigarettes or if you have certain conditions. Talk to your health care provider about which screenings and vaccines you need and how often you need them. This information is not intended to replace advice given to you by your health care provider. Make sure you discuss any questions you have with your health care provider. Document Released: 01/10/2016 Document Revised: 09/02/2016 Document Reviewed: 10/15/2015 Elsevier Interactive Patient Education  2017 [employer] Prevention in the Home Falls can cause injuries. They can happen to people of all ages. There are many things you can do to make your home safe and to help prevent falls. What can I do on the outside of my home? Regularly fix the edges of walkways and driveways and fix any cracks. Remove anything that might make you trip as you walk through a door, such as a raised step or threshold. Trim any bushes or trees on the path to your home. Use bright outdoor lighting. Clear any walking paths of anything that might make someone trip, such as rocks or tools. Regularly check to see if handrails are loose or broken. Make sure that both sides of any steps have handrails. Any raised decks and porches should have guardrails on the edges. Have any leaves, snow, or ice cleared regularly. Use sand or salt on walking paths during winter. Clean up any spills in your garage right away. This includes oil or grease spills. What can I do in the bathroom? Use night lights. Install grab bars by the toilet and in the tub and shower. Do not use towel bars as grab bars. Use non-skid mats or decals in the tub or shower. If you need to sit down in the shower, use a plastic, non-slip stool. Keep  the floor dry. Clean up any water that spills on the floor as soon as it happens. Remove soap buildup in the tub or shower regularly. Attach bath mats securely with double-sided non-slip rug tape. Do not have throw rugs and other things on the floor that can make you trip. What can I do in the bedroom? Use night lights. Make sure that you have a light by your bed that is easy to reach. Do not use any sheets or blankets that are too big for your bed. They should not hang down onto the floor. Have a firm chair that has side arms. You can use this for support while you get dressed. Do not have throw rugs and other things on the floor that can make you trip. What can I do in the kitchen? Clean up any spills right away. Avoid walking on wet floors. Keep items that you use a lot in easy-to-reach places. If you need to reach something above you, use a strong step stool that has a grab bar. Keep electrical cords out of the way. Do not use floor polish or wax that makes floors slippery. If you must use wax, use non-skid floor wax. Do not have throw rugs and other things on the floor that can make you trip. What can I do with my stairs? Do not leave any items on the stairs. Make sure that there are  handrails on both sides of the stairs and use them. Fix handrails that are broken or loose. Make sure that handrails are as long as the stairways. Check any carpeting to make sure that it is firmly attached to the stairs. Fix any carpet that is loose or worn. Avoid having throw rugs at the top or bottom of the stairs. If you do have throw rugs, attach them to the floor with carpet tape. Make sure that you have a light switch at the top of the stairs and the bottom of the stairs. If you do not have them, ask someone to add them for you. What else can I do to help prevent falls? Wear shoes that: Do not have high heels. Have rubber bottoms. Are comfortable and fit you well. Are closed at the toe. Do not  wear sandals. If you use a stepladder: Make sure that it is fully opened. Do not climb a closed stepladder. Make sure that both sides of the stepladder are locked into place. Ask someone to hold it for you, if possible. Clearly mark and make sure that you can see: Any grab bars or handrails. First and last steps. Where the edge of each step is. Use tools that help you move around (mobility aids) if they are needed. These include: Canes. Walkers. Scooters. Crutches. Turn on the lights when you go into a dark area. Replace any light bulbs as soon as they burn out. Set up your furniture so you have a clear path. Avoid moving your furniture around. If any of your floors are uneven, fix them. If there are any pets around you, be aware of where they are. Review your medicines with your doctor. Some medicines can make you feel dizzy. This can increase your chance of falling. Ask your doctor what other things that you can do to help prevent falls. This information is not intended to replace advice given to you by your health care provider. Make sure you discuss any questions you have with your health care provider. Document Released: 10/10/2009 Document Revised: 05/21/2016 Document Reviewed: 01/18/2015 Elsevier Interactive Patient Education  2017 [employer].

## 2022-07-24 NOTE — Assessment & Plan Note (Signed)
botox treatment in 03/2022 unsuccessful, will be managed at [facility],

## 2022-07-24 NOTE — Assessment & Plan Note (Signed)
Controlled, no change in medication  

## 2022-07-24 NOTE — Assessment & Plan Note (Signed)
Corrected currently

## 2022-07-24 NOTE — Assessment & Plan Note (Signed)
Controlled on current meds.

## 2022-07-24 NOTE — Assessment & Plan Note (Signed)
Hyperlipidemia:Low fat diet discussed and encouraged.   Lipid Panel  Lab Results  Component Value Date   CHOL 72 (L) 07/22/2022   HDL 33 (L) 07/22/2022   LDLCALC 14 07/22/2022   LDLDIRECT 138 (H) 09/15/2012   TRIG 147 07/22/2022   CHOLHDL 2.2 07/22/2022     No med change

## 2022-07-24 NOTE — Progress Notes (Signed)
   [name]     MRN: [id number]      DOB: 1961/07/21   HPI [name] is here for follow up and re-evaluation of chronic medical conditions, medication management and review of any available recent lab and radiology data.  Preventive health is updated, specifically  Cancer screening and Immunization.   Remains in daily severe abdominal pain , has recurrent obstructions, has upcoming procedure at [facility] to address the issue, states option of organ transplant  has also been mentioned C/o recurrent episodes of low potassium and magnesium as well as low iron, she also has hypotension and often feels weak. Though she has the desire to eat is unable to because of recurrent / chronic abdominal pain and distension  ROS Denies recent fever or chills. Denies sinus pressure, nasal congestion, ear pain or sore throat. Denies chest congestion, productive cough or wheezing. Denies chest pains, palpitations and leg swelling Denies abdominal pain, nausea, vomiting,diarrhea or constipation.   . Denies uncontrolled  headaches,  denies seizures,  Denies depression,  does have mild anxiety or insomnia. Denies skin break down or rash.   PE  BP 110/70   Pulse 91   Ht '5\' 4"'$  (1.626 m)   Wt 172 lb 1.3 oz (78.1 kg)   SpO2 94%   BMI 29.54 kg/m    Patient alert and oriented and in no cardiopulmonary distress.  HEENT: No facial asymmetry, EOMI,     Neck supple .  Chest: Clear to auscultation bilaterally.  CVS: S1, S2 no murmurs, no S3.Regular rate. Abd: distended Ext: No edema  MS: Adequate ROM spine, shoulders, hips and knees.  Skin: Intact, no ulcerations or rash noted.  Psych: Good eye contact, normal affect. Memory intact not anxious or depressed appearing.  CNS: CN 2-12 intact, power,  normal throughout.no focal deficits noted.   Assessment & Plan  Abdominal pain Chronic daily abdominal pain with recurrent obstruction, upcoming  Intervention at [facility] in the near  future  Generalized intestinal dysmotility botox treatment in 03/2022 unsuccessful, will be managed at [facility],   Headache syndrome Controlled on current meds  Hypothyroidism Controlled, no change in medication   Hypokalemia Controlled, no change in medication   Hyperlipidemia LDL goal <100 Hyperlipidemia:Low fat diet discussed and encouraged.   Lipid Panel  Lab Results  Component Value Date   CHOL 72 (L) 07/22/2022   HDL 33 (L) 07/22/2022   LDLCALC 14 07/22/2022   LDLDIRECT 138 (H) 09/15/2012   TRIG 147 07/22/2022   CHOLHDL 2.2 07/22/2022     No med change  Iron deficiency anemia Corrected currently  Vitamin D deficiency Currently overcorrected and holding supplement  Anxiety state Controlled, no change in medication

## 2022-07-24 NOTE — Progress Notes (Signed)
Subjective:   [name] is a 61 y.o. female who presents for Medicare Annual (Subsequent) preventive examination.  Review of Systems     [name] , Thank you for taking time to come for your Medicare Wellness Visit. I appreciate your ongoing commitment to your health goals. Please review the following plan we discussed and let me know if I can assist you in the future.   These are the goals we discussed:  Goals      Weight (lb) < 200 lb (90.7 kg)     Lose weight so that I may get my hernia repaired        This is a list of the screening recommended for you and due dates:  Health Maintenance  Topic Date Due   Urine Protein Check  Never done   Zoster (Shingles) Vaccine (1 of 2) Never done   COVID-19 Vaccine (3 - Moderna risk series) 05/20/2020   Flu Shot  07/28/2022   Pap Smear  02/26/2023   Mammogram  06/22/2024   Tetanus Vaccine  12/22/2026   Colon Cancer Screening  05/03/2031   Hepatitis C Screening: USPSTF Recommendation to screen - Ages 18-79 yo.  Completed   HIV Screening  Completed   HPV Vaccine  Aged Out          Objective:    There were no vitals filed for this visit. There is no height or weight on file to calculate BMI.     06/03/2022   11:05 AM 05/12/2022    4:07 PM 05/08/2022   10:32 AM 03/29/2022    1:11 AM 03/29/2022   12:59 AM 03/26/2022    8:51 AM 01/08/2022    8:57 AM  Advanced Directives  Does Patient Have a Medical Advance Directive? No;Yes Yes Yes No  Yes Yes  Type of Paramedic of [place];Living will [place];Living will [place];Living will Healthcare Power of [place];Living will Living will;Healthcare Power of Attorney  Does patient want to make changes to medical advance directive? No - Patient declined Yes (ED - Information included in AVS) No - Patient declined    No - Patient declined  Copy of [place] in Chart? Yes  - validated most recent copy scanned in chart (See row information) Yes - validated most recent copy scanned in chart (See row information) No - copy requested No - copy requested  Yes - validated most recent copy scanned in chart (See row information)   Would patient like information on creating a medical advance directive? No - Patient declined   No - Patient declined No - Patient declined No - Patient declined No - Patient declined    Current Medications (verified) Outpatient Encounter Medications as of 07/24/2022  Medication Sig   ALPRAZolam (XANAX) 1 MG tablet Take 1 tablet (1 mg total) by mouth 2 (two) times daily.   [START ON 08/05/2022] ALPRAZolam (XANAX) 1 MG tablet Take 1 tablet (1 mg total) by mouth 2 (two) times daily.   aspirin 81 MG chewable tablet Chew 81 mg by mouth daily.   BIOTIN PO Take 1 capsule by mouth daily.   butalbital-acetaminophen-caffeine (FIORICET) 50-325-40 MG tablet Take 1 tablet by mouth every 6 (six) hours as needed.   conjugated estrogens (PREMARIN) vaginal cream Place 1 Applicatorful (1 application. total) vaginally daily.   cyanocobalamin (,VITAMIN B-12,) 1000 MCG/ML injection Inject 1 mL (1,000 mcg total) into the muscle every  30 (thirty) days.   cyclobenzaprine (FLEXERIL) 10 MG tablet TAKE 1 TABLET BY MOUTH TWICE DAILY AS NEEDED FOR MUSCLE SPASMS (Patient taking differently: Take 10 mg by mouth 2 (two) times daily.)   cycloSPORINE (RESTASIS) 0.05 % ophthalmic emulsion Place 1 drop into both eyes 2 (two) times daily as needed (dry eyes).   DULoxetine (CYMBALTA) 60 MG capsule Take 60 mg by mouth daily.   erythromycin (ERY-TAB) 250 MG EC tablet Take 1 tablet (250 mg total) by mouth 3 (three) times daily before meals. TAKE 1 TABLET BY MOUTH THREE TIMES DAILY BEFORE MEALS Strength: 250 mg   ezetimibe (ZETIA) 10 MG tablet Take 1 tablet (10 mg total) by mouth daily. (Patient taking differently: Take 10 mg by mouth at bedtime.)   fluticasone (FLONASE) 50 MCG/ACT nasal  spray Place 2 sprays into both nostrils daily as needed for allergies.   HYDROmorphone (DILAUDID) 4 MG tablet Take 4 mg by mouth 3 (three) times daily.   levothyroxine (SYNTHROID) 125 MCG tablet TAKE 1 TABLET(125 MCG) BY MOUTH DAILY BEFORE BREAKFAST (Patient taking differently: Take 125 mcg by mouth daily before breakfast.)   lubiprostone (AMITIZA) 24 MCG capsule TAKE 1 CAPSULE(24 MCG) BY MOUTH TWICE DAILY WITH A MEAL   Magnesium 400 MG CAPS Take one capsule by mouth twicwe daily   magnesium oxide (MAG-OX) 400 (240 Mg) MG tablet Take 1 tablet (400 mg total) by mouth 2 (two) times daily. Please make overdue appt with Dr. Caryl Comes before anymore refills. Thank you 1st attempt   meclizine (ANTIVERT) 25 MG tablet Take by mouth.   metFORMIN (GLUCOPHAGE-XR) 500 MG 24 hr tablet Take 500 mg by mouth every evening.   Multiple Vitamins-Minerals (ICAPS AREDS 2 PO) Take 1 capsule by mouth every evening.   omeprazole (PRILOSEC) 40 MG capsule Take 1 capsule (40 mg total) by mouth in the morning and at bedtime.   potassium chloride (KLOR-CON M) 10 MEQ tablet TAKE 1 TABLET BY MOUTH EVERY DAY   pravastatin (PRAVACHOL) 20 MG tablet Take 1 tablet (20 mg total) by mouth daily.   promethazine (PHENERGAN) 12.5 MG tablet Take 1 tablet (12.5 mg total) by mouth every 6 (six) hours as needed for nausea or vomiting.   SUPER B COMPLEX/C PO Take 1 capsule by mouth daily.   XARELTO 20 MG TABS tablet TAKE 1 TABLET(20 MG) BY MOUTH DAILY   No facility-administered encounter medications on file as of 07/24/2022.    Allergies (verified) Nitrofurantoin, Crestor [rosuvastatin calcium], Bisacodyl, Clarithromycin, Clindamycin hcl, Codeine, Iron sucrose, Monosodium glutamate, Scopolamine hbr, and Other   History: Past Medical History:  Diagnosis Date   Abdominal hernia 08/14/2019   Abdominal pain 06/06/2019   Acute bronchitis 05/01/2013   Acute cystitis 06/09/2010   Qualifier: Diagnosis of  By: Cori Razor LPN, Brandi     Acute renal  insufficiency 08/28/2011   Allergy    Phreesia 03/16/2021   Anemia    Phreesia 10/29/2020   Anxiety    Anxiety state 03/16/2008   Qualifier: Diagnosis of  By: Dierdre Harness     Arthritis    Phreesia 10/29/2020   Back pain with radiation 07/17/2014   Bilateral hand pain 07/27/2016   Blood transfusion without reported diagnosis    Phreesia 10/29/2020   Breast cancer ([place])    L breast- 2006   Cancer ([place])    Phreesia 10/29/2020   Carpal tunnel syndrome    Chronic constipation    Chronic pain syndrome    followed by [facility]---  back   Clotting disorder ([place])    Phreesia 10/29/2020   Depression    Dermatitis 12/15/2011   Difficult intubation    Educated about COVID-19 virus infection 05/14/2019   Fall at home 01/26/2016   Family history of adverse reaction to anesthesia    MOTHER--- PONV   Fatigue 09/17/2012   Fibromyalgia    FIBROMYALGIA 03/16/2008   Qualifier: Diagnosis of  By: Dierdre Harness     GERD (gastroesophageal reflux disease)    Headache disorder 01/16/2013   Headache syndrome 01/26/2016   Hip pain, right 07/08/2019   History of MRSA infection    lip abscess   History of ovarian cyst 06/2011   s/p  BSO   History of pulmonary embolus (PE) 1997   post EP with ablation pulmonary veouns for SVT/ Atrial Fib.   History of supraventricular tachycardia    s/p  ablation 1996  and 1997  by dr Caryl Comes   History of TIA (transient ischemic attack) 1997   post op EP ablation PE   Hyperlipidemia    Hyperlipidemia LDL goal <100 03/16/2008   Qualifier: Diagnosis of  By: Dierdre Harness     Hypothyroidism    followed by pcp   Incisional hernia    Insomnia 12/15/2011   Intermittent palpitations 08/22/2017   Interstitial cystitis    09-13-2018   per pt last flare-up  May 2019 (followed by pcp)   INTERSTITIAL CYSTITIS 02/17/2011   Qualifier: Diagnosis of  By: Moshe Cipro MD, Margaret     Iron deficiency anemia    09-13-2018  PER PT STABLE   Irregular heart rate 07/25/2013    Lipoma of back    upper   Low back pain radiating to right leg 07/04/2019   Low ferritin level 06/14/2014   MDD (major depressive disorder), single episode, in full remission ([place]) 10/27/2017   Medically noncompliant 02/28/2015   Multiple missed appointments, both follow-up appointments and lab appointments.    Metabolic syndrome X 3/89/3734   Qualifier: Diagnosis of  By: Moshe Cipro MD, Margaret  hBA1c is 5.8 in 02/2013    Migraines    Morbid obesity ([place]) 03/27/2013   Muscle spasm 01/03/2020   Nausea alone 07/17/2014   NECK PAIN, CHRONIC 03/16/2008   Qualifier: Diagnosis of  By: Dierdre Harness     Normal coronary arteries    a. by CT 12/2018.   Obesity 03/16/2008   Qualifier: Diagnosis of  By: Dierdre Harness     Oral ulceration 08/23/2014   Presented at 06/14/2014 visit    Other malaise and fatigue 03/16/2008   Centricity Description: FATIGUE, CHRONIC Qualifier: Diagnosis of  By: Dierdre Harness   Centricity Description: FATIGUE Qualifier: Diagnosis of  By: Claybon Jabs PA, Brooks     OVARIAN CYST 12/26/2009   Qualifier: Diagnosis of  By: Moshe Cipro MD, Margaret     Paget disease of breast, left [facility])    Paget's disease of breast, left ([place]) 03/16/2008   Qualifier: Diagnosis of  By: Dierdre Harness  Left diagnosed in 2006 F/h breast cancer x 15 family members   Partial small bowel obstruction ([place]) 07/04/2012   PONV (postoperative nausea and vomiting)    SEVERE   Postsurgical menopause 11/13/2011   Presence of IVC filter 06/06/2019   PSVT (paroxysmal supraventricular tachycardia) ([place])    [place]   Recurrent oral herpes simplex infection 10/13/2017   ROM (right otitis media) 01/23/2013   S/P insertion of IVC (inferior vena caval) filter 05/08/2005   [place] (non-retrievable)  /  dx 2019  a leg of filter is protruding thru the vena cava in to right L2 vertebral body (09-13-2018  per pt having surgery to remove filter in [place])   S/P radiofrequency ablation operation for arrhythmia 1996    1996  and 1997,   SVT and Atrial Fib   Sinusitis, chronic 01/26/2016   SMALL BOWEL OBSTRUCTION, HX OF 08/07/2008   Annotation: obstruction w/ adhesions led to partial colectomy Qualifier: Diagnosis of  By: Craige Cotta     Stroke [facility])    [place] 10/29/2020, TIAs in the past   Swelling of hand 09/17/2012   Syncope    Tachycardia 02/03/2010   Qualifier: Diagnosis of  By: Via LPN, Lynn     Thyroid disease    Phreesia 10/29/2020   Ulcer 12/15/2011   Urinary frequency 07/18/2014   Vaginitis and vulvovaginitis 05/10/2013   Vitamin D deficiency 05/05/2015   Wears glasses    Past Surgical History:  Procedure Laterality Date   ABDOMINAL HYSTERECTOMY  1987   ANTERIOR CERVICAL DECOMP/DISCECTOMY FUSION  03-07-2002   dr elsner  '@[facility]'$    C 4 -- 5   APPENDECTOMY  1980   AUGMENTATION MAMMAPLASTY Right 2006   BILATERAL SALPINGOOPHORECTOMY  07/24/2011   via Explor. Lap. w/ intraoperative perf. bowel repair   BIOPSY  05/02/2021   Procedure: BIOPSY;  Surgeon: Montez Morita, Quillian Quince, MD;  [facility];  Service: Gastroenterology;;  small bowel, mid esophagus, distal esophagus, random colon biopsies   BIOPSY  12/04/2021   Procedure: BIOPSY;  Surgeon: [name], DO;  [facility];  Service: Endoscopy;;   BIOPSY  03/31/2022   Procedure: BIOPSY;  Surgeon: Harvel Quale, MD;  [facility];  Service: Gastroenterology;;   BOTOX INJECTION N/A 03/31/2022   Procedure: BOTOX INJECTION;  Surgeon: Harvel Quale, MD;  [facility];  Service: Gastroenterology;  Laterality: N/A;   BREAST BIOPSY Right 2019   benign   BREAST ENHANCEMENT SURGERY Bilateral 1993   BREAST IMPLANT REMOVAL Bilateral    BREAST SURGERY N/A    Phreesia 10/29/2020   CARDIAC CATHETERIZATION  07-06-2003   dr Darnell Level brodie   normal coronaries and LVF   CARDIAC ELECTROPHYSIOLOGY STUDY AND ABLATION  1996  and 1997   CARDIOVASCULAR STRESS TEST  11-18-2015   dr Caryl Comes   normal  nuclear study w/ no ischemia/  normal LV function and wall motion , ef 84%   CARPAL TUNNEL RELEASE Right ?   CESAREAN SECTION  1986   CESAREAN SECTION N/A    Phreesia 10/29/2020   CHOLECYSTECTOMY N/A    Phreesia 10/29/2020   COLON SURGERY     COLONOSCOPY WITH PROPOFOL N/A 05/02/2021   Procedure: COLONOSCOPY WITH PROPOFOL;  Surgeon: Harvel Quale, MD;  [facility];  Service: Gastroenterology;  Laterality: N/A;  12:30 PM   CYSTO/  HYDRODISTENTION/  INSTILATION THERAPY  MULTIPLE   ENTEROCUTANEOUS FISTULA CLOSURE  multiple   last one 2015 with small bowel resection   ESOPHAGOGASTRODUODENOSCOPY (EGD) WITH PROPOFOL N/A 05/02/2021   Procedure: ESOPHAGOGASTRODUODENOSCOPY (EGD) WITH PROPOFOL;  Surgeon: Harvel Quale, MD;  [facility];  Service: Gastroenterology;  Laterality: N/A;   ESOPHAGOGASTRODUODENOSCOPY (EGD) WITH PROPOFOL N/A 12/04/2021   Procedure: ESOPHAGOGASTRODUODENOSCOPY (EGD) WITH PROPOFOL;  Surgeon: [name], DO;  [facility];  Service: Endoscopy;  Laterality: N/A;   ESOPHAGOGASTRODUODENOSCOPY (EGD) WITH PROPOFOL N/A 03/31/2022   Procedure: ESOPHAGOGASTRODUODENOSCOPY (EGD) WITH PROPOFOL;  Surgeon: Harvel Quale,  MD;  [facility];  Service: Gastroenterology;  Laterality: N/A;  [place] / RESECTION SMALL BOWEL  11-09-2014   '@[facility]'$    FRACTURE SURGERY N/A    Phreesia 10/29/2020   JOINT REPLACEMENT N/A    Phreesia 10/29/2020   LIPOMA EXCISION Right 09/16/2018   Procedure: EXCISION LIPOMA UPPER BACK;  Surgeon: Clovis Riley, MD;  Location: [facility];  Service: General;  Laterality: Right;   MASTECTOMY Left 2006   w/ reconstruction on left (paget's disease)  and right breast augmentation   POLYPECTOMY  05/02/2021   Procedure: POLYPECTOMY;  Surgeon: Harvel Quale, MD;  [facility];  Service: Gastroenterology;;   gastric   POLYPECTOMY  03/31/2022   Procedure: POLYPECTOMY;  Surgeon: Harvel Quale, MD;  [facility];  Service: Gastroenterology;;   Azzie Almas DILATION  05/02/2021   Procedure: Azzie Almas DILATION;  Surgeon: Montez Morita, Quillian Quince, MD;  [facility];  Service: Gastroenterology;;   SMALL INTESTINE SURGERY N/A    Phreesia 10/29/2020   SPINE SURGERY N/A    Phreesia 10/29/2020   TOTAL COLECTOMY  08-04-2002    '@[facility]'$    AND CHOLECYSTECTOMY  (colonic inertia)   TRANSTHORACIC ECHOCARDIOGRAM  02/04/2016   ef 60-65%,  grade 2 diastolic dysfunction/  mild MR   TUBAL LIGATION N/A    Phreesia 03/16/2021   VENA CAVA FILTER PLACEMENT  05/08/2005   '@[facility]'$    [place] (non-retrievable)   WIDE EXCISION PERIRECTAL ABSCESSES  09-22-2005   @ 35   Family History  Problem Relation Age of Onset   Diabetes Mother    Heart disease Mother    Hypertension Mother    Heart disease Father    Hyperlipidemia Father    Hypertension Father    Alcohol abuse Father    Colon cancer Maternal Aunt    Breast cancer Maternal Aunt    Cancer Maternal Uncle        mets   Bone cancer Maternal Grandfather        mets   Ovarian cancer Cousin 19   Breast cancer Cousin    Prostate cancer Maternal Uncle    Breast cancer Maternal Aunt    Brain cancer Maternal Aunt    Breast cancer Maternal Aunt    Social History   Socioeconomic History   Marital status: Married    Spouse name: Not on file   Number of children: Not on file   Years of education: Not on file   Highest education level: Not on file  Occupational History   Not on file  Tobacco Use   Smoking status: Never   Smokeless tobacco: Never  Vaping Use   Vaping Use: Never used  Substance and Sexual Activity   Alcohol use: No    Comment: twice per year   Drug use: No   Sexual activity: Yes    Birth control/protection: Surgical  Other Topics Concern   Not on file  Social History Narrative   Not on file   Social Determinants  of Health   Financial Resource Strain: Medium Risk (05/14/2021)   Overall Financial Resource Strain (CARDIA)    Difficulty of Paying Living Expenses: Somewhat hard  Food Insecurity: No Food Insecurity (05/14/2021)   Hunger Vital Sign    Worried About Running Out of Food in the Last Year: Never true    Ran Out of Food in the Last Year: Never true  Transportation Needs: No Transportation Needs (  05/14/2021)   PRAPARE - Hydrologist (Medical): No    Lack of Transportation (Non-Medical): No  Physical Activity: Insufficiently Active (05/14/2021)   Exercise Vital Sign    Days of Exercise per Week: 7 days    Minutes of Exercise per Session: 20 min  Stress: No Stress Concern Present (05/14/2021)   [place]    Feeling of Stress : Not at all  Social Connections: Moderately Isolated (05/14/2021)   Social Connection and Isolation Panel [NHANES]    Frequency of Communication with Friends and Family: More than three times a week    Frequency of Social Gatherings with Friends and Family: More than three times a week    Attends Religious Services: Never    Marine scientist or Organizations: No    Attends Archivist Meetings: Never    Marital Status: Married    Tobacco Counseling Counseling given: Not Answered   Clinical Intake:                 Diabetic? No         Activities of Daily Living    06/04/2022    4:00 AM 03/29/2022    1:29 AM  In your present state of health, do you have any difficulty performing the following activities:  Hearing? 0 0  Vision? 0 0  Difficulty concentrating or making decisions? 0 0  Walking or climbing stairs? 0 0  Dressing or bathing? 0 0  Doing errands, shopping? 0 0    Patient Care Team: Fayrene Helper, MD as PCP - General Werner Lean, MD as PCP - Cardiology (Cardiology) Deboraha Sprang, MD as PCP - Electrophysiology  (Cardiology) Selinda Eon (Inactive) as Referring Physician Ray, Debroah Loop, MD (Anesthesiology) Hennie Duos, MD as Consulting Physician (Rheumatology) Ray, Debroah Loop, MD as Referring Physician (Anesthesiology) Lovenia Shuck, MD as Referring Physician (Cardiothoracic Surgery)  Indicate any recent Medical Services you may have received from other than [facility] providers in the past year (date may be approximate).     Assessment:   This is a routine wellness examination for Elizabelle.  Hearing/Vision screen No results found.  Dietary issues and exercise activities discussed:     Goals Addressed   None   Depression Screen    07/22/2022    4:03 PM 12/15/2021    8:58 AM 09/19/2021    8:51 AM 07/18/2021    1:13 PM 05/14/2021    4:50 PM 05/14/2021    2:47 PM 04/07/2021    1:21 PM  PHQ 2/9 Scores  PHQ - 2 Score 0 0 0 2 0 0 0  PHQ- 9 Score    6       Fall Risk    07/22/2022    4:03 PM 05/12/2022    1:50 PM 12/15/2021    8:58 AM 12/12/2021    1:30 PM 09/19/2021    8:51 AM  Fall Risk   Falls in the past year? '1 1 1 1 1  '$ Number falls in past yr: 0 '1 1 1 1  '$ Injury with Fall? 0 '1 1 1 '$ 0  Risk for fall due to : Impaired balance/gait  History of fall(s);Impaired mobility History of fall(s);Impaired balance/gait Impaired balance/gait  Follow up Falls evaluation completed  Falls evaluation completed;Falls prevention discussed Falls evaluation completed;Education provided;Falls prevention discussed Falls evaluation completed    FALL RISK PREVENTION PERTAINING TO THE HOME:  Any stairs  in or around the home? Yes  If so, are there any without handrails? No  Home free of loose throw rugs in walkways, pet beds, electrical cords, etc? Yes  Adequate lighting in your home to reduce risk of falls? Yes   ASSISTIVE DEVICES UTILIZED TO PREVENT FALLS:  Life alert? No  Use of a cane, walker or w/c? No  Grab bars in the bathroom? Yes  Shower chair or bench in shower? Yes  Elevated toilet seat or  a handicapped toilet? Yes     Cognitive Function:        10/31/2020    1:41 PM 05/03/2019    1:31 PM  6CIT Screen  What Year? 0 points 0 points  What month? 0 points 0 points  What time? 0 points 0 points  Count back from 20 0 points 0 points  Months in reverse  0 points  Repeat phrase  0 points  Total Score  0 points    Immunizations Immunization History  Administered Date(s) Administered   Hepatitis A 06/27/2009, 12/26/2009   Hepatitis A, Adult 06/27/2009, 12/26/2009   Hepatitis B 06/27/2009, 08/06/2009, 12/26/2009   Hepatitis B, ped/adol 06/27/2009, 08/06/2009, 12/26/2009   Influenza Split 09/15/2012   Influenza Whole 09/13/2007, 10/01/2008, 09/09/2010, 09/23/2011   Influenza, Seasonal, Injecte, Preservative Fre 10/08/2014   Influenza,inj,Quad PF,6+ Mos 10/25/2013, 09/23/2015, 09/18/2016, 10/11/2017, 08/25/2018, 10/17/2019, 10/05/2020, 09/19/2021   Influenza-Unspecified 09/15/2012, 09/27/2013   Moderna Sars-Covid-2 Vaccination 03/18/2020, 04/22/2020   Pneumococcal Polysaccharide-23 09/23/2011   Td 10/27/2005   Tdap 12/22/2016    TDAP status: Up to date  Flu Vaccine status: Up to date  Covid-19 vaccine status: Completed vaccines  Qualifies for Shingles Vaccine? Yes   Zostavax completed Yes   Shingrix Completed?: Yes  Screening Tests Health Maintenance  Topic Date Due   URINE MICROALBUMIN  Never done   Zoster Vaccines- Shingrix (1 of 2) Never done   COVID-19 Vaccine (3 - Moderna risk series) 05/20/2020   INFLUENZA VACCINE  07/28/2022   PAP SMEAR-Modifier  02/26/2023   MAMMOGRAM  06/22/2024   TETANUS/TDAP  12/22/2026   COLONOSCOPY (Pts 45-85yr Insurance coverage will need to be confirmed)  05/03/2031   Hepatitis C Screening  Completed   HIV Screening  Completed   HPV VACCINES  Aged Out    Health Maintenance  Health Maintenance Due  Topic Date Due   URINE MICROALBUMIN  Never done   Zoster Vaccines- Shingrix (1 of 2) Never done   COVID-19 Vaccine (3  - Moderna risk series) 05/20/2020    Colorectal cancer screening: Type of screening: Colonoscopy. Completed 05/02/2021. Repeat every 10 years  Mammogram status: Completed 06/22/2022. Repeat every year   Lung Cancer Screening: (Low Dose CT Chest recommended if Age 61-80years, 30 pack-year currently smoking OR have quit w/in 15years.) does not qualify.    Additional Screening:  Hepatitis C Screening: does qualify; Completed 01/27/2016  Vision Screening: Recommended annual ophthalmology exams for early detection of glaucoma and other disorders of the eye. Is the patient up to date with their annual eye exam?  Yes  Who is the provider or what is the name of the office in which the patient attends annual eye exams? [facility]  Dental Screening: Recommended annual dental exams for proper oral hygiene  Community Resource Referral / Chronic Care Management: CRR required this visit?  No   CCM required this visit?  No      Plan:     I have personally reviewed and noted the  following in the patient's chart:   Medical and social history Use of alcohol, tobacco or illicit drugs  Current medications and supplements including opioid prescriptions.  Functional ability and status Nutritional status Physical activity Advanced directives List of other physicians Hospitalizations, surgeries, and ER visits in previous 12 months Vitals Screenings to include cognitive, depression, and falls Referrals and appointments  In addition, I have reviewed and discussed with patient certain preventive protocols, quality metrics, and best practice recommendations. A written personalized care plan for preventive services as well as general preventive health recommendations were provided to patient.     Johny Drilling, [place]   07/24/2022   Nurse Notes:  Ms. Wilmott , Thank you for taking time to come for your Medicare Wellness Visit. I appreciate your ongoing commitment to your health goals. Please  review the following plan we discussed and let me know if I can assist you in the future.   These are the goals we discussed:  Goals      Weight (lb) < 200 lb (90.7 kg)     Lose weight so that I may get my hernia repaired        This is a list of the screening recommended for you and due dates:  Health Maintenance  Topic Date Due   Urine Protein Check  Never done   Zoster (Shingles) Vaccine (1 of 2) Never done   COVID-19 Vaccine (3 - Moderna risk series) 05/20/2020   Flu Shot  07/28/2022   Pap Smear  02/26/2023   Mammogram  06/22/2024   Tetanus Vaccine  12/22/2026   Colon Cancer Screening  05/03/2031   Hepatitis C Screening: USPSTF Recommendation to screen - Ages 18-79 yo.  Completed   HIV Screening  Completed   HPV Vaccine  Aged Out

## 2022-07-24 NOTE — Assessment & Plan Note (Signed)
Chronic daily abdominal pain with recurrent obstruction, upcoming  Intervention at [facility] in the near future

## 2022-07-29 NOTE — Progress Notes (Signed)
I connected with  [name] on 07/24/2022 by a audio enabled telemedicine application and verified that I am speaking with the correct person using two identifiers.  Patient Location: Home  Provider Location: Office/Clinic  I discussed the limitations of evaluation and management by telemedicine. The patient expressed understanding and agreed to proceed.

## 2022-08-05 DIAGNOSIS — Z01818 Encounter for other preprocedural examination: Secondary | ICD-10-CM | POA: Diagnosis not present

## 2022-08-05 DIAGNOSIS — F4322 Adjustment disorder with anxiety: Secondary | ICD-10-CM | POA: Diagnosis not present

## 2022-08-05 DIAGNOSIS — Z86711 Personal history of pulmonary embolism: Secondary | ICD-10-CM | POA: Diagnosis not present

## 2022-08-05 DIAGNOSIS — Z7901 Long term (current) use of anticoagulants: Secondary | ICD-10-CM | POA: Diagnosis not present

## 2022-08-05 DIAGNOSIS — E032 Hypothyroidism due to medicaments and other exogenous substances: Secondary | ICD-10-CM | POA: Diagnosis not present

## 2022-08-05 DIAGNOSIS — G894 Chronic pain syndrome: Secondary | ICD-10-CM | POA: Diagnosis not present

## 2022-08-05 DIAGNOSIS — Z8614 Personal history of Methicillin resistant Staphylococcus aureus infection: Secondary | ICD-10-CM | POA: Diagnosis not present

## 2022-08-05 DIAGNOSIS — Z189 Retained foreign body fragments, unspecified material: Secondary | ICD-10-CM | POA: Diagnosis not present

## 2022-08-06 DIAGNOSIS — R110 Nausea: Secondary | ICD-10-CM | POA: Diagnosis not present

## 2022-08-06 DIAGNOSIS — R634 Abnormal weight loss: Secondary | ICD-10-CM | POA: Diagnosis not present

## 2022-08-06 DIAGNOSIS — I69398 Other sequelae of cerebral infarction: Secondary | ICD-10-CM | POA: Diagnosis not present

## 2022-08-06 DIAGNOSIS — R5382 Chronic fatigue, unspecified: Secondary | ICD-10-CM | POA: Diagnosis not present

## 2022-08-06 DIAGNOSIS — R933 Abnormal findings on diagnostic imaging of other parts of digestive tract: Secondary | ICD-10-CM | POA: Diagnosis not present

## 2022-08-06 DIAGNOSIS — Z6828 Body mass index (BMI) 28.0-28.9, adult: Secondary | ICD-10-CM | POA: Diagnosis not present

## 2022-08-06 DIAGNOSIS — I480 Paroxysmal atrial fibrillation: Secondary | ICD-10-CM | POA: Diagnosis not present

## 2022-08-06 DIAGNOSIS — M797 Fibromyalgia: Secondary | ICD-10-CM | POA: Diagnosis not present

## 2022-08-06 DIAGNOSIS — K9131 Postprocedural partial intestinal obstruction: Secondary | ICD-10-CM | POA: Diagnosis not present

## 2022-08-06 DIAGNOSIS — Z86711 Personal history of pulmonary embolism: Secondary | ICD-10-CM | POA: Diagnosis not present

## 2022-08-06 DIAGNOSIS — Z980 Intestinal bypass and anastomosis status: Secondary | ICD-10-CM | POA: Diagnosis not present

## 2022-08-06 DIAGNOSIS — I471 Supraventricular tachycardia: Secondary | ICD-10-CM | POA: Diagnosis not present

## 2022-08-06 DIAGNOSIS — G43909 Migraine, unspecified, not intractable, without status migrainosus: Secondary | ICD-10-CM | POA: Diagnosis not present

## 2022-08-06 DIAGNOSIS — N393 Stress incontinence (female) (male): Secondary | ICD-10-CM | POA: Diagnosis not present

## 2022-08-06 DIAGNOSIS — M2669 Other specified disorders of temporomandibular joint: Secondary | ICD-10-CM | POA: Diagnosis not present

## 2022-08-06 DIAGNOSIS — E039 Hypothyroidism, unspecified: Secondary | ICD-10-CM | POA: Diagnosis not present

## 2022-08-06 DIAGNOSIS — G629 Polyneuropathy, unspecified: Secondary | ICD-10-CM | POA: Diagnosis not present

## 2022-08-06 DIAGNOSIS — G894 Chronic pain syndrome: Secondary | ICD-10-CM | POA: Diagnosis not present

## 2022-08-06 DIAGNOSIS — Z881 Allergy status to other antibiotic agents status: Secondary | ICD-10-CM | POA: Diagnosis not present

## 2022-08-06 DIAGNOSIS — K219 Gastro-esophageal reflux disease without esophagitis: Secondary | ICD-10-CM | POA: Diagnosis not present

## 2022-08-12 ENCOUNTER — Ambulatory Visit: Payer: Medicare Other | Admitting: Family Medicine

## 2022-08-25 ENCOUNTER — Institutional Professional Consult (permissible substitution): Payer: Medicare Other | Admitting: Plastic Surgery

## 2022-08-28 ENCOUNTER — Telehealth: Payer: Self-pay | Admitting: Family Medicine

## 2022-08-28 NOTE — Telephone Encounter (Signed)
Error , no message to be sent

## 2022-09-03 ENCOUNTER — Inpatient Hospital Stay: Payer: Medicare Other | Attending: Physician Assistant

## 2022-09-03 ENCOUNTER — Other Ambulatory Visit: Payer: Self-pay | Admitting: Family Medicine

## 2022-09-03 DIAGNOSIS — R519 Headache, unspecified: Secondary | ICD-10-CM | POA: Diagnosis not present

## 2022-09-03 DIAGNOSIS — Z853 Personal history of malignant neoplasm of breast: Secondary | ICD-10-CM | POA: Insufficient documentation

## 2022-09-03 DIAGNOSIS — Z86711 Personal history of pulmonary embolism: Secondary | ICD-10-CM | POA: Insufficient documentation

## 2022-09-03 DIAGNOSIS — E538 Deficiency of other specified B group vitamins: Secondary | ICD-10-CM | POA: Insufficient documentation

## 2022-09-03 DIAGNOSIS — K909 Intestinal malabsorption, unspecified: Secondary | ICD-10-CM | POA: Diagnosis not present

## 2022-09-03 DIAGNOSIS — R109 Unspecified abdominal pain: Secondary | ICD-10-CM | POA: Insufficient documentation

## 2022-09-03 DIAGNOSIS — G2581 Restless legs syndrome: Secondary | ICD-10-CM | POA: Diagnosis not present

## 2022-09-03 DIAGNOSIS — R634 Abnormal weight loss: Secondary | ICD-10-CM | POA: Diagnosis not present

## 2022-09-03 DIAGNOSIS — R42 Dizziness and giddiness: Secondary | ICD-10-CM | POA: Insufficient documentation

## 2022-09-03 DIAGNOSIS — Z7901 Long term (current) use of anticoagulants: Secondary | ICD-10-CM | POA: Insufficient documentation

## 2022-09-03 DIAGNOSIS — R0602 Shortness of breath: Secondary | ICD-10-CM | POA: Insufficient documentation

## 2022-09-03 DIAGNOSIS — R112 Nausea with vomiting, unspecified: Secondary | ICD-10-CM | POA: Insufficient documentation

## 2022-09-03 DIAGNOSIS — R300 Dysuria: Secondary | ICD-10-CM | POA: Diagnosis not present

## 2022-09-03 DIAGNOSIS — M791 Myalgia, unspecified site: Secondary | ICD-10-CM | POA: Insufficient documentation

## 2022-09-03 DIAGNOSIS — K5900 Constipation, unspecified: Secondary | ICD-10-CM | POA: Diagnosis not present

## 2022-09-03 DIAGNOSIS — K912 Postsurgical malabsorption, not elsewhere classified: Secondary | ICD-10-CM

## 2022-09-03 DIAGNOSIS — Z8719 Personal history of other diseases of the digestive system: Secondary | ICD-10-CM | POA: Diagnosis not present

## 2022-09-03 DIAGNOSIS — E559 Vitamin D deficiency, unspecified: Secondary | ICD-10-CM | POA: Insufficient documentation

## 2022-09-03 DIAGNOSIS — R509 Fever, unspecified: Secondary | ICD-10-CM | POA: Insufficient documentation

## 2022-09-03 DIAGNOSIS — D509 Iron deficiency anemia, unspecified: Secondary | ICD-10-CM | POA: Insufficient documentation

## 2022-09-03 DIAGNOSIS — R079 Chest pain, unspecified: Secondary | ICD-10-CM | POA: Insufficient documentation

## 2022-09-03 DIAGNOSIS — R197 Diarrhea, unspecified: Secondary | ICD-10-CM | POA: Insufficient documentation

## 2022-09-03 DIAGNOSIS — E038 Other specified hypothyroidism: Secondary | ICD-10-CM

## 2022-09-03 LAB — IRON AND TIBC
Iron: 73 ug/dL (ref 28–170)
Saturation Ratios: 21 % (ref 10.4–31.8)
TIBC: 350 ug/dL (ref 250–450)
UIBC: 277 ug/dL

## 2022-09-03 LAB — CBC WITH DIFFERENTIAL/PLATELET
Abs Immature Granulocytes: 0.02 10*3/uL (ref 0.00–0.07)
Basophils Absolute: 0 10*3/uL (ref 0.0–0.1)
Basophils Relative: 1 %
Eosinophils Absolute: 0.1 10*3/uL (ref 0.0–0.5)
Eosinophils Relative: 1 %
HCT: 45.8 % (ref 36.0–46.0)
Hemoglobin: 14.9 g/dL (ref 12.0–15.0)
Immature Granulocytes: 0 %
Lymphocytes Relative: 34 %
Lymphs Abs: 2 10*3/uL (ref 0.7–4.0)
MCH: 30.5 pg (ref 26.0–34.0)
MCHC: 32.5 g/dL (ref 30.0–36.0)
MCV: 93.9 fL (ref 80.0–100.0)
Monocytes Absolute: 0.3 10*3/uL (ref 0.1–1.0)
Monocytes Relative: 5 %
Neutro Abs: 3.3 10*3/uL (ref 1.7–7.7)
Neutrophils Relative %: 59 %
Platelets: 174 10*3/uL (ref 150–400)
RBC: 4.88 MIL/uL (ref 3.87–5.11)
RDW: 13 % (ref 11.5–15.5)
WBC: 5.7 10*3/uL (ref 4.0–10.5)
nRBC: 0 % (ref 0.0–0.2)

## 2022-09-03 LAB — VITAMIN D 25 HYDROXY (VIT D DEFICIENCY, FRACTURES): Vit D, 25-Hydroxy: 55.79 ng/mL (ref 30–100)

## 2022-09-03 LAB — VITAMIN B12: Vitamin B-12: 397 pg/mL (ref 180–914)

## 2022-09-03 LAB — FERRITIN: Ferritin: 193 ng/mL (ref 11–307)

## 2022-09-03 LAB — FOLATE: Folate: 18.9 ng/mL (ref >5.9)

## 2022-09-04 ENCOUNTER — Other Ambulatory Visit: Payer: Self-pay

## 2022-09-04 ENCOUNTER — Telehealth: Payer: Self-pay

## 2022-09-04 MED ORDER — POTASSIUM CHLORIDE CRYS ER 10 MEQ PO TBCR: 10.0000 meq | EXTENDED_RELEASE_TABLET | Freq: Every day | ORAL | 0 refills | Status: DC

## 2022-09-04 NOTE — Telephone Encounter (Signed)
Requests xanax refill

## 2022-09-04 NOTE — Telephone Encounter (Signed)
Refills today  ALPRAZolam (XANAX) 1 MG tablet    potassium chloride (KLOR-CON M) 10 MEQ tablet     Pharmacy:  [facility]

## 2022-09-05 ENCOUNTER — Other Ambulatory Visit: Payer: Self-pay | Admitting: Family Medicine

## 2022-09-05 NOTE — Telephone Encounter (Signed)
Refilled x 5 months in 07/2022

## 2022-09-05 NOTE — Progress Notes (Signed)
Xnax refilled

## 2022-09-06 ENCOUNTER — Other Ambulatory Visit: Payer: Self-pay | Admitting: Family Medicine

## 2022-09-06 ENCOUNTER — Other Ambulatory Visit (INDEPENDENT_AMBULATORY_CARE_PROVIDER_SITE_OTHER): Payer: Self-pay | Admitting: Gastroenterology

## 2022-09-07 LAB — METHYLMALONIC ACID, SERUM: Methylmalonic Acid, Quantitative: 119 nmol/L (ref 0–378)

## 2022-09-07 NOTE — Telephone Encounter (Signed)
Last seen 12/30/21 °

## 2022-09-11 ENCOUNTER — Inpatient Hospital Stay (HOSPITAL_BASED_OUTPATIENT_CLINIC_OR_DEPARTMENT_OTHER): Payer: Medicare Other | Admitting: Physician Assistant

## 2022-09-11 DIAGNOSIS — E538 Deficiency of other specified B group vitamins: Secondary | ICD-10-CM

## 2022-09-11 DIAGNOSIS — E559 Vitamin D deficiency, unspecified: Secondary | ICD-10-CM

## 2022-09-11 DIAGNOSIS — R42 Dizziness and giddiness: Secondary | ICD-10-CM

## 2022-09-11 DIAGNOSIS — D509 Iron deficiency anemia, unspecified: Secondary | ICD-10-CM | POA: Diagnosis not present

## 2022-09-11 DIAGNOSIS — R0602 Shortness of breath: Secondary | ICD-10-CM

## 2022-09-11 DIAGNOSIS — K5900 Constipation, unspecified: Secondary | ICD-10-CM

## 2022-09-11 DIAGNOSIS — R109 Unspecified abdominal pain: Secondary | ICD-10-CM

## 2022-09-11 DIAGNOSIS — R5383 Other fatigue: Secondary | ICD-10-CM | POA: Diagnosis not present

## 2022-09-11 DIAGNOSIS — R790 Abnormal level of blood mineral: Secondary | ICD-10-CM

## 2022-09-11 DIAGNOSIS — R300 Dysuria: Secondary | ICD-10-CM

## 2022-09-11 DIAGNOSIS — R197 Diarrhea, unspecified: Secondary | ICD-10-CM

## 2022-09-11 NOTE — Progress Notes (Signed)
Virtual Visit via Telephone Note [facility]  I connected with [name]  on 09/11/22 at 10:56 AM by telephone and verified that I am speaking with the correct person using two identifiers.  Location: Patient: Home Provider: [facility]   I discussed the limitations, risks, security and privacy concerns of performing an evaluation and management service by telephone and the availability of in person appointments. I also discussed with the patient that there may be a patient responsible charge related to this service. The patient expressed understanding and agreed to proceed.  REASON FOR VISIT:  Follow-up for iron deficiency anemia   CURRENT THERAPY: IV iron (last INFED on 01/08/2022)  INTERVAL HISTORY: [name] (61 y.o. female) is contacted today for follow-up of her iron deficiency anemia.  She was last evaluated via telemedicine visit by Tarri Abernethy PA-C on 05/08/2022.  [name] has a history of iron deficiency status secondary malabsorption from multiple bowel resections.  She also has history of Paget's disease of the left breast which was resected with mastectomy in 2006.  She also had unprovoked pulmonary embolism in 1997 and is on Xarelto.  She was hospitalized from 03/28/2022 through 04/02/2022 for small bowel obstruction. She was hospitalized from 06/03/2022 through 06/08/2022 for viral gastroenteritis and acute on chronic abdominal pain.  She is now on a small bowel transplant list.  She has an upcoming procedure for intestinal dilation on 10/01/2022.  [name] has multiple medical complaints at today's visit related to her high burden of chronic disease and multiple medical issues, particularly her gastroparesis and ongoing complications from her previous abdominal surgeries.   She continues to have chronic fatigue, which has been somewhat worsened lately.  She has diffuse myalgias and feels "achy all over."  She  currently reports that she is "completely wiped out." She reports stable restless legs, headaches, dyspnea on exertion. She continues to have episodes of lightheadedness, "room spinning," and her arms and legs feeling numb.  She has previously had issues with chest pain for the past several years, which she reports has had negative cardiac work-up - she reports that she reached out to her cardiologist again due to worsening episodes of chest pain.   She continues to take Xarelto due to her history of unprovoked pulmonary embolisms.  She is tolerating it well without major bleeding events.  She denies any epistaxis, hematemesis, hematochezia, or melena.   She has not had any new unilateral leg swelling, pain, or erythema.   She denies any new breast lumps or lymphadenopathy.  Diagnostic mammogram from 06/22/2022 was BI-RADS Category 1, negative.  She has had significant weight loss over the past several months, which she believes is from her gastroparesis and other GI issues.   She also reports intermittent low-grade fever (temperature around 100 F) since October 2022.  She reports 20% energy and 50% appetite.    OBSERVATIONS/OBJECTIVE: Review of Systems  Constitutional:  Positive for malaise/fatigue and weight loss. Negative for chills, diaphoresis and fever.  Respiratory:  Positive for shortness of breath. Negative for cough.   Cardiovascular:  Positive for chest pain. Negative for palpitations.  Gastrointestinal:  Positive for abdominal pain, constipation, diarrhea, nausea and vomiting. Negative for blood in stool and melena.  Genitourinary:  Positive for dysuria.  Neurological:  Positive for dizziness, tingling and headaches.  Psychiatric/Behavioral:  Positive for depression. The patient is nervous/anxious and has insomnia.      PHYSICAL EXAM (per limitations of virtual  telephone visit): The patient is alert and oriented x 3, exhibiting adequate mentation, good mood, and ability to speak in  full sentences and execute sound judgement.   ASSESSMENT & PLAN: 1.  Iron deficiency anemia: - History of iron deficiency status secondary to multiple bowel resections and malabsorption. - Previous work-up (02/13/2021) showed normal LDH.  Folic acid and O16, copper and methylmalonic acid were normal.  SPEP was negative.  CA 15-3 and TSH were also normal. - Most recent EGD (05/02/2021) showed single gastric polyp, otherwise normal - Most recent colonoscopy (05/02/2021) was limited by liquid stool in the colon, showed nonbleeding internal hemorrhoids - Patient was given IV Venofer on 02/18/2021 but had a reaction to Venofer.  Most recently received received INFeD infusion on 01/08/2022, which she tolerated after premedication was given. - No bright red blood per rectum or melena   - Most recent labs (09/03/2022): Hgb 14.9/MCV 93.9, ferritin 193, iron saturation 21% - Additional nutritional panel (09/03/2022):  Normal B12, folate, methylmalonic acid.  Previously had normal copper. - PLAN: No indication for IV iron at this time. - Repeat labs and office visit in 4 months.   2.  Unprovoked pulmonary embolism: - Most recent D-dimer is negative.  No current signs or symptoms of DVT/PE. - Patient is worried that she is not digesting her medications, since she is having issues with GI motility, gastroparesis, and frequent vomiting. - We discussed that she could change from Xarelto to Lovenox, but she prefers to hold off on this for the time being.  She will discuss with her other specialists and reach out to our clinic if she decides to change the formulation of her anticoagulant. - PLAN: Continue Xarelto.  Would consider switching to Lovenox in the future if there is persistent concern for GI absorption of medications in the setting of GI motility issues and gastroparesis.    3.  History of Paget's disease of left breast - History of Paget's disease of the left breast which was resected with mastectomy in  2006. - Most recent mammogram 06/02/2021: BI-RADS Category 1 negative - Most recent diagnostic mammogram (06/22/2022): BI-RADS Category 1, negative - She reports itching in her right nipple.  Patient reports that she was also told that her right breast implant has been recalled and she hopes to get this removed in the future. - She had negative BRCA testing in 2006.  She request referral to genetic counselor to see if she qualifies for repeat testing. - PLAN: Continue annual mammograms and breast exams via PCP and breast center.  If any findings concerning for malignancy are discovered, she will need to be referred back to [place] as a new patient for evaluation by Dr. Delton Coombes. - We will place referral to genetic counselor per patient request.   4.  B12 deficiency secondary to intestinal malabsorption - She has been self-administering B12 injections - Most recent B12 and MMA are normal.   5.  Vitamin D deficiency - She has been taking vitamin D 50,000 units every week - Prior vitamin D level (04/29/2022) showed vitamin D overload at 131.72 - She has been holding her vitamin D supplement for the past 4 months. - Most recent vitamin D (09/03/2022) normal at 55.79 - PLAN: Due to how quickly her vitamin D level can drop, recommend restarting vitamin D 50,000 units monthly. - We will recheck at follow-up visit in 4 months.    FOLLOW UP INSTRUCTIONS: Labs in 4 months Office visit after labs  I discussed the assessment and treatment plan with the patient. The patient was provided an opportunity to ask questions and all were answered. The patient agreed with the plan and demonstrated an understanding of the instructions.   The patient was advised to call back or seek an in-person evaluation if the symptoms worsen or if the condition fails to improve as anticipated.  I provided 23 minutes of non-face-to-face time during this encounter.   Harriett Rush, PA-C 09/11/2022 7:55 PM

## 2022-09-14 ENCOUNTER — Encounter: Payer: Self-pay | Admitting: Family Medicine

## 2022-09-14 ENCOUNTER — Ambulatory Visit (HOSPITAL_BASED_OUTPATIENT_CLINIC_OR_DEPARTMENT_OTHER): Payer: Medicare Other | Admitting: Family

## 2022-09-14 ENCOUNTER — Telehealth: Payer: Self-pay | Admitting: Family Medicine

## 2022-09-14 ENCOUNTER — Encounter (HOSPITAL_BASED_OUTPATIENT_CLINIC_OR_DEPARTMENT_OTHER): Payer: Self-pay | Admitting: Family

## 2022-09-14 VITALS — BP 96/80 | HR 80 | Ht 64.0 in | Wt 179.0 lb

## 2022-09-14 DIAGNOSIS — R002 Palpitations: Secondary | ICD-10-CM | POA: Diagnosis not present

## 2022-09-14 DIAGNOSIS — R079 Chest pain, unspecified: Secondary | ICD-10-CM

## 2022-09-14 MED ORDER — METOPROLOL SUCCINATE ER 25 MG PO TB24: 12.5000 mg | ORAL_TABLET | Freq: Every day | ORAL | 1 refills | Status: DC

## 2022-09-14 MED ORDER — METOPROLOL TARTRATE 25 MG PO TABS: ORAL_TABLET | ORAL | 3 refills | Status: DC

## 2022-09-14 NOTE — Progress Notes (Unsigned)
Office Visit    Patient Name: [name] Date of Encounter: 09/14/2022  PCP:  Fayrene Helper, [place]  Cardiologist:  Werner Lean, MD  Advanced Practice Provider:  No care team member to display Electrophysiologist:  Virl Axe, MD      Chief Complaint    [name] is a 61 y.o. female presents today for chest pain   Past Medical History    Past Medical History:  Diagnosis Date   Abdominal hernia 08/14/2019   Abdominal pain 06/06/2019   Acute bronchitis 05/01/2013   Acute cystitis 06/09/2010   Qualifier: Diagnosis of  By: Cori Razor LPN, Brandi     Acute renal insufficiency 08/28/2011   Allergy    Phreesia 03/16/2021   Anemia    Phreesia 10/29/2020   Anxiety    Anxiety state 03/16/2008   Qualifier: Diagnosis of  By: Dierdre Harness     Arthritis    Phreesia 10/29/2020   Back pain with radiation 07/17/2014   Bilateral hand pain 07/27/2016   Blood transfusion without reported diagnosis    Phreesia 10/29/2020   Breast cancer ([place])    L breast- 2006   Cancer ([place])    Phreesia 10/29/2020   Carpal tunnel syndrome    Chronic constipation    Chronic pain syndrome    followed by [facility]---  back   Clotting disorder ([place])    Phreesia 10/29/2020   Depression    Dermatitis 12/15/2011   Difficult intubation    Educated about COVID-19 virus infection 05/14/2019   Fall at home 01/26/2016   Family history of adverse reaction to anesthesia    MOTHER--- PONV   Fatigue 09/17/2012   Fibromyalgia    FIBROMYALGIA 03/16/2008   Qualifier: Diagnosis of  By: Dierdre Harness     GERD (gastroesophageal reflux disease)    Headache disorder 01/16/2013   Headache syndrome 01/26/2016   Hip pain, right 07/08/2019   History of MRSA infection    lip abscess   History of ovarian cyst 06/2011   s/p  BSO   History of pulmonary embolus (PE) 1997   post EP with ablation pulmonary veouns for SVT/ Atrial Fib.   History of  supraventricular tachycardia    s/p  ablation 1996  and 1997  by dr Caryl Comes   History of TIA (transient ischemic attack) 1997   post op EP ablation PE   Hyperlipidemia    Hyperlipidemia LDL goal <100 03/16/2008   Qualifier: Diagnosis of  By: Dierdre Harness     Hypothyroidism    followed by pcp   Incisional hernia    Insomnia 12/15/2011   Intermittent palpitations 08/22/2017   Interstitial cystitis    09-13-2018   per pt last flare-up  May 2019 (followed by pcp)   INTERSTITIAL CYSTITIS 02/17/2011   Qualifier: Diagnosis of  By: Moshe Cipro MD, Margaret     Iron deficiency anemia    09-13-2018  PER PT STABLE   Irregular heart rate 07/25/2013   Lipoma of back    upper   Low back pain radiating to right leg 07/04/2019   Low ferritin level 06/14/2014   MDD (major depressive disorder), single episode, in full remission ([place]) 10/27/2017   Medically noncompliant 02/28/2015   Multiple missed appointments, both follow-up appointments and lab appointments.    Metabolic syndrome X 04/22/8340   Qualifier: Diagnosis of  By: Moshe Cipro MD, Margaret  hBA1c is 5.8 in 02/2013  Migraines    Morbid obesity ([place]) 03/27/2013   Muscle spasm 01/03/2020   Nausea alone 07/17/2014   NECK PAIN, CHRONIC 03/16/2008   Qualifier: Diagnosis of  By: Dierdre Harness     Normal coronary arteries    a. by CT 12/2018.   Obesity 03/16/2008   Qualifier: Diagnosis of  By: Dierdre Harness     Oral ulceration 08/23/2014   Presented at 06/14/2014 visit    Other malaise and fatigue 03/16/2008   Centricity Description: FATIGUE, CHRONIC Qualifier: Diagnosis of  By: Dierdre Harness   Centricity Description: FATIGUE Qualifier: Diagnosis of  By: Claybon Jabs PA, Dawn     OVARIAN CYST 12/26/2009   Qualifier: Diagnosis of  By: Moshe Cipro MD, Margaret     Paget disease of breast, left [facility])    Paget's disease of breast, left ([place]) 03/16/2008   Qualifier: Diagnosis of  By: Dierdre Harness  Left diagnosed in 2006 F/h breast cancer x 15 family members   Partial  small bowel obstruction ([place]) 07/04/2012   PONV (postoperative nausea and vomiting)    SEVERE   Postsurgical menopause 11/13/2011   Presence of IVC filter 06/06/2019   PSVT (paroxysmal supraventricular tachycardia) ([place])    [place]   Recurrent oral herpes simplex infection 10/13/2017   ROM (right otitis media) 01/23/2013   S/P insertion of IVC (inferior vena caval) filter 05/08/2005   [place] (non-retrievable)  /  dx 2019  a leg of filter is protruding thru the vena cava in to right L2 vertebral body (09-13-2018  per pt having surgery to remove filter in [place])   S/P radiofrequency ablation operation for arrhythmia 1996   1996  and 1997,   SVT and Atrial Fib   Sinusitis, chronic 01/26/2016   SMALL BOWEL OBSTRUCTION, HX OF 08/07/2008   Annotation: obstruction w/ adhesions led to partial colectomy Qualifier: Diagnosis of  By: Craige Cotta     Stroke [facility])    [place] 10/29/2020, TIAs in the past   Swelling of hand 09/17/2012   Syncope    Tachycardia 02/03/2010   Qualifier: Diagnosis of  By: Via LPN, Lynn     Thyroid disease    Phreesia 10/29/2020   Ulcer 12/15/2011   Urinary frequency 07/18/2014   Vaginitis and vulvovaginitis 05/10/2013   Vitamin D deficiency 05/05/2015   Wears glasses    Past Surgical History:  Procedure Laterality Date   ABDOMINAL HYSTERECTOMY  1987   ANTERIOR CERVICAL DECOMP/DISCECTOMY FUSION  03-07-2002   dr elsner  '@[facility]'$    C 4 -- 5   APPENDECTOMY  1980   AUGMENTATION MAMMAPLASTY Right 2006   BILATERAL SALPINGOOPHORECTOMY  07/24/2011   via Explor. Lap. w/ intraoperative perf. bowel repair   BIOPSY  05/02/2021   Procedure: BIOPSY;  Surgeon: Montez Morita, Quillian Quince, MD;  [facility];  Service: Gastroenterology;;  small bowel, mid esophagus, distal esophagus, random colon biopsies   BIOPSY  12/04/2021   Procedure: BIOPSY;  Surgeon: [name], DO;  [facility];  Service: Endoscopy;;   BIOPSY  03/31/2022   Procedure:  BIOPSY;  Surgeon: Harvel Quale, MD;  [facility];  Service: Gastroenterology;;   BOTOX INJECTION N/A 03/31/2022   Procedure: BOTOX INJECTION;  Surgeon: Harvel Quale, MD;  [facility];  Service: Gastroenterology;  Laterality: N/A;   BREAST BIOPSY Right 2019   benign   BREAST ENHANCEMENT SURGERY Bilateral 1993   BREAST IMPLANT REMOVAL Bilateral    BREAST SURGERY N/A  Phreesia 10/29/2020   CARDIAC CATHETERIZATION  07-06-2003   dr Darnell Level brodie   normal coronaries and LVF   CARDIAC ELECTROPHYSIOLOGY STUDY AND ABLATION  1996  and 1997   CARDIOVASCULAR STRESS TEST  11-18-2015   dr Caryl Comes   normal nuclear study w/ no ischemia/  normal LV function and wall motion , ef 84%   CARPAL TUNNEL RELEASE Right ?   CESAREAN SECTION  1986   CESAREAN SECTION N/A    Phreesia 10/29/2020   CHOLECYSTECTOMY N/A    Phreesia 10/29/2020   COLON SURGERY     COLONOSCOPY WITH PROPOFOL N/A 05/02/2021   Procedure: COLONOSCOPY WITH PROPOFOL;  Surgeon: Harvel Quale, MD;  [facility];  Service: Gastroenterology;  Laterality: N/A;  12:30 PM   CYSTO/  HYDRODISTENTION/  INSTILATION THERAPY  MULTIPLE   ENTEROCUTANEOUS FISTULA CLOSURE  multiple   last one 2015 with small bowel resection   ESOPHAGOGASTRODUODENOSCOPY (EGD) WITH PROPOFOL N/A 05/02/2021   Procedure: ESOPHAGOGASTRODUODENOSCOPY (EGD) WITH PROPOFOL;  Surgeon: Harvel Quale, MD;  [facility];  Service: Gastroenterology;  Laterality: N/A;   ESOPHAGOGASTRODUODENOSCOPY (EGD) WITH PROPOFOL N/A 12/04/2021   Procedure: ESOPHAGOGASTRODUODENOSCOPY (EGD) WITH PROPOFOL;  Surgeon: [name], DO;  [facility];  Service: Endoscopy;  Laterality: N/A;   ESOPHAGOGASTRODUODENOSCOPY (EGD) WITH PROPOFOL N/A 03/31/2022   Procedure: ESOPHAGOGASTRODUODENOSCOPY (EGD) WITH PROPOFOL;  Surgeon: Harvel Quale, MD;  [facility];  Service: Gastroenterology;   Laterality: N/A;  [name] / RESECTION SMALL BOWEL  11-09-2014   '@[facility]'$    FRACTURE SURGERY N/A    Phreesia 10/29/2020   JOINT REPLACEMENT N/A    Phreesia 10/29/2020   LIPOMA EXCISION Right 09/16/2018   Procedure: EXCISION LIPOMA UPPER BACK;  Surgeon: Clovis Riley, MD;  Location: [place];  Service: General;  Laterality: Right;   MASTECTOMY Left 2006   w/ reconstruction on left (paget's disease)  and right breast augmentation   POLYPECTOMY  05/02/2021   Procedure: POLYPECTOMY;  Surgeon: Harvel Quale, MD;  [facility];  Service: Gastroenterology;;  gastric   POLYPECTOMY  03/31/2022   Procedure: POLYPECTOMY;  Surgeon: Harvel Quale, MD;  [facility];  Service: Gastroenterology;;   Azzie Almas DILATION  05/02/2021   Procedure: Azzie Almas DILATION;  Surgeon: Harvel Quale, MD;  [facility];  Service: Gastroenterology;;   SMALL INTESTINE SURGERY N/A    Phreesia 10/29/2020   SPINE SURGERY N/A    Phreesia 10/29/2020   TOTAL COLECTOMY  08-04-2002    '@[facility]'$    AND CHOLECYSTECTOMY  (colonic inertia)   TRANSTHORACIC ECHOCARDIOGRAM  02/04/2016   ef 60-65%,  grade 2 diastolic dysfunction/  mild MR   TUBAL LIGATION N/A    Phreesia 03/16/2021   VENA CAVA FILTER PLACEMENT  05/08/2005   '@[facility]'$    [place] (non-retrievable)   WIDE EXCISION PERIRECTAL ABSCESSES  09-22-2005   @ [facility]    Allergies  Allergies  Allergen Reactions   Nitrofurantoin Hives   Crestor [Rosuvastatin Calcium] Other (See Comments)    Generalized cramps   Bisacodyl Other (See Comments)    Makes patient feel like she is having cramps    Clarithromycin Hives and Other (See Comments)    Cluster migraines   Clindamycin Hcl Hives   Codeine Itching   Iron Sucrose Other (See Comments)    [place]- required benadryl and solu medrol   Monosodium Glutamate Other (See Comments)  Cluster migraines     Scopolamine Hbr Other (See Comments)    Cluster migraines, impaired vision   Other Hives, Itching, Swelling, Rash and Other (See Comments)    Cigarette smoke    History of Present Illness    [name] is a 61 y.o. female with a hx of HTN, IDA, fibromyalgia, IVC filter last seen 02/10/22.  Last seen 02/10/22 with chest pain. Coronary CTA 01/11/19 calcium score of 0. Repeat cardiac CTA 03/06/22 with calcium score 0.  Presents today for follow up. Still with sporadic severe mid-sternal chest pain which radiates into neck and right arm. Lasting a couple seconds to a couple minutes. Occurring a couple times per week. No noted aggravating factors. Does improve a small bit when she takes a drink of something cold. Occurs at rest, washing dishes, or waking from sleep. Tells me she has had workup with GI for esophageal spasm which was unremarkable.   Has been taking Metoprolol '25mg'$  twice daily due to palpitations which does help but it also lowers her blood pressure. Also notes these low blood pressure happened prior to starting Metoprolol. Drinks water with electrolytes. Taking her potassium and magnesium.  Was told by [facility] RN that her pulse in her right leg was weak. No pain in her legs. Walks dog for exercise.   EKGs/Labs/Other Studies Reviewed:   The following studies were reviewed today:  Cardiac CTA 02/2022 IMPRESSION: 1. No evidence of CAD, CADRADS = 0.   2. Coronary calcium score of 0. This was 0 percentile for age and sex matched control.   3. Normal coronary origin with left dominance.   4. Dilated main pulmonary artery at 30 mm, suggestive of pulmonary hypertension.   5. Compared to prior CT coronary angiogram ON 01/11/2019, there has been no change  EKG:  EKG is ordered today.  The ekg ordered today demonstrates NSR 80 bpm with no acute ST/T wave changes.   Recent Labs: 03/28/2022: B Natriuretic Peptide 11.0 07/22/2022: ALT 45; BUN 15; Creatinine, Ser 0.66; Magnesium  1.6; Potassium 4.2; Sodium 143; TSH 1.550 09/03/2022: Hemoglobin 14.9; Platelets 174  Recent Lipid Panel    Component Value Date/Time   CHOL 72 (L) 07/22/2022 1652   TRIG 147 07/22/2022 1652   HDL 33 (L) 07/22/2022 1652   CHOLHDL 2.2 07/22/2022 1652   CHOLHDL 3.4 09/11/2021 1401   VLDL 56 (H) 09/11/2021 1401   LDLCALC 14 07/22/2022 1652   LDLDIRECT 138 (H) 09/15/2012 1540    Home Medications   Current Meds  Medication Sig   ALPRAZolam (XANAX) 1 MG tablet Take 1 tablet (1 mg total) by mouth 2 (two) times daily.   aspirin 81 MG chewable tablet Chew 81 mg by mouth daily.   BIOTIN PO Take 1 capsule by mouth daily.   butalbital-acetaminophen-caffeine (FIORICET) 50-325-40 MG tablet Take 1 tablet by mouth every 6 (six) hours as needed.   conjugated estrogens (PREMARIN) vaginal cream Place 1 Applicatorful (1 application. total) vaginally daily.   cyanocobalamin (,VITAMIN B-12,) 1000 MCG/ML injection Inject 1 mL (1,000 mcg total) into the muscle every 30 (thirty) days.   cyclobenzaprine (FLEXERIL) 10 MG tablet TAKE 1 TABLET BY MOUTH TWICE DAILY AS NEEDED FOR MUSCLE SPASMS (Patient taking differently: Take 10 mg by mouth 2 (two) times daily.)   cycloSPORINE (RESTASIS) 0.05 % ophthalmic emulsion Place 1 drop into both eyes 2 (two) times daily as needed (dry eyes).   DULoxetine (CYMBALTA) 60 MG capsule Take 60 mg by mouth daily.  erythromycin (ERY-TAB) 250 MG EC tablet Take 1 tablet (250 mg total) by mouth 3 (three) times daily before meals. TAKE 1 TABLET BY MOUTH THREE TIMES DAILY BEFORE MEALS Strength: 250 mg   ezetimibe (ZETIA) 10 MG tablet Take 1 tablet (10 mg total) by mouth daily. (Patient taking differently: Take 10 mg by mouth at bedtime.)   fluticasone (FLONASE) 50 MCG/ACT nasal spray Place 2 sprays into both nostrils daily as needed for allergies.   HYDROmorphone (DILAUDID) 4 MG tablet Take 4 mg by mouth 3 (three) times daily.   levothyroxine (SYNTHROID) 125 MCG tablet TAKE 1 TABLET(125  MCG) BY MOUTH DAILY BEFORE BREAKFAST   lubiprostone (AMITIZA) 24 MCG capsule TAKE 1 CAPSULE(24 MCG) BY MOUTH TWICE DAILY WITH A MEAL   Magnesium 400 MG CAPS Take one capsule by mouth twicwe daily   metoprolol tartrate (LOPRESSOR) 25 MG tablet Take 25 mg by mouth 2 (two) times daily.   Multiple Vitamins-Minerals (ICAPS AREDS 2 PO) Take 1 capsule by mouth every evening.   nitrofurantoin, macrocrystal-monohydrate, (MACROBID) 100 MG capsule Take 100 mg by mouth 2 (two) times daily.   omeprazole (PRILOSEC) 40 MG capsule Take 1 capsule (40 mg total) by mouth in the morning and at bedtime.   phenazopyridine (PYRIDIUM) 95 MG tablet Take by mouth.   polyethylene glycol (MIRALAX / GLYCOLAX) 17 g packet Take by mouth.   potassium chloride (KLOR-CON M) 10 MEQ tablet Take 1 tablet (10 mEq total) by mouth daily.   pravastatin (PRAVACHOL) 20 MG tablet Take 1 tablet (20 mg total) by mouth daily.   promethazine (PHENERGAN) 12.5 MG tablet Take 1 tablet (12.5 mg total) by mouth every 6 (six) hours as needed for nausea or vomiting.   sennosides-docusate sodium (SENOKOT-S) 8.6-50 MG tablet as needed.   XARELTO 20 MG TABS tablet TAKE 1 TABLET(20 MG) BY MOUTH DAILY     Review of Systems      All other systems reviewed and are otherwise negative except as noted above.  Physical Exam    VS:  BP 96/80   Pulse 80   Ht '5\' 4"'$  (1.626 m)   Wt 179 lb (81.2 kg)   BMI 30.73 kg/m  , BMI Body mass index is 30.73 kg/m.  Wt Readings from Last 3 Encounters:  09/14/22 179 lb (81.2 kg)  07/22/22 172 lb 1.3 oz (78.1 kg)  06/03/22 167 lb (75.8 kg)     GEN: Well nourished, well developed, in no acute distress. HEENT: normal. Neck: Supple, no JVD, carotid bruits, or masses. Cardiac: RRR, no murmurs, rubs, or gallops. No clubbing, cyanosis, edema.  Radials/PT/DP 2+ and equal bilaterally.  Respiratory: Chest pain-respirations regular and unlabored, clear to auscultation bilaterally. GI: Soft, nontender, nondistended. MS:  No deformity or atrophy. Skin: Warm and dry, no rash. Neuro:  Strength and sensation are intact. Psych: Normal affect.  Assessment & Plan    Chest pain -atypical for angina as it occurs at rest.  EKG today with no acute ST/T wave changes.  Cardiac CTA 2020 and repeat 02/2022 with coronary calcium score of 0.  Reassurance provided that her chest pain is noncardiac.  Further work-up per primary care or GI. Palpitations -quiescent on metoprolol.  Continue same. Diminished pedal pulse by [place] with RN noted diminished right DP pulse and suggested to be evaluated.  Bilateral DP/PT 2+.  She has no claudication symptoms.  No indication for ABI at this time.  Reassurance provided.         Disposition:  Follow up in 1 year(s) with Werner Lean, MD or APP.  Signed, Loel Dubonnet, NP 09/14/2022, 3:51 PM [place]

## 2022-09-14 NOTE — Telephone Encounter (Signed)
Pt called stating that since Dr. Merlene Laughter office is closing they are no longer taking new patients. She is wanting to know if she can please be referred to [facility]?

## 2022-09-14 NOTE — Patient Instructions (Addendum)
Medication Instructions:  Your physician has recommended you make the following change in your medication:   Change: Metoprolol Tartrate twice daily as needed for breakthrough palpitations   Start: Metoprolol Succinate 12.'5mg'$  daily- if this isn't strong enough call us and we can increase the dose.   *If you need a refill on your cardiac medications before your next appointment, please call your pharmacy*  Follow-Up: At [facility], you and your health needs are our priority.  As part of our continuing mission to provide you with exceptional heart care, we have created designated Provider Care Teams.  These Care Teams include your primary Cardiologist (physician) and Advanced Practice Providers (APPs -  Physician Assistants and Nurse Practitioners) who all work together to provide you with the care you need, when you need it.  We recommend signing up for the patient portal called "MyChart".  Sign up information is provided on this After Visit Summary.  MyChart is used to connect with patients for Virtual Visits (Telemedicine).  Patients are able to view lab/test results, encounter notes, upcoming appointments, etc.  Non-urgent messages can be sent to your provider as well.   To learn more about what you can do with MyChart, go to [email or web address].    Your next appointment:   1 year(s)  The format for your next appointment:   In Person  Provider:   Werner Lean, MD   Other Instructions Heart Healthy Diet Recommendations: A low-salt diet is recommended. Meats should be grilled, baked, or boiled. Avoid fried foods. Focus on lean protein sources like fish or chicken with vegetables and fruits. The American Heart Association is a Microbiologist!  American Heart Association Diet and Lifeystyle Recommendations   Exercise recommendations: The American Heart Association recommends 150 minutes of moderate intensity exercise weekly. Try 30 minutes of moderate intensity  exercise 4-5 times per week. This could include walking, jogging, or swimming.

## 2022-09-15 ENCOUNTER — Other Ambulatory Visit: Payer: Self-pay

## 2022-09-15 DIAGNOSIS — G894 Chronic pain syndrome: Secondary | ICD-10-CM

## 2022-09-15 NOTE — Telephone Encounter (Signed)
Referral sent 

## 2022-09-15 NOTE — Telephone Encounter (Signed)
Referral entered for [facility] pain management per patient request.

## 2022-09-15 NOTE — Telephone Encounter (Signed)
See previous msg.

## 2022-09-17 ENCOUNTER — Encounter (HOSPITAL_BASED_OUTPATIENT_CLINIC_OR_DEPARTMENT_OTHER): Payer: Self-pay | Admitting: Family

## 2022-09-17 DIAGNOSIS — Z79891 Long term (current) use of opiate analgesic: Secondary | ICD-10-CM | POA: Diagnosis not present

## 2022-09-17 DIAGNOSIS — G43101 Migraine with aura, not intractable, with status migrainosus: Secondary | ICD-10-CM | POA: Diagnosis not present

## 2022-09-17 DIAGNOSIS — M545 Low back pain, unspecified: Secondary | ICD-10-CM | POA: Diagnosis not present

## 2022-09-17 DIAGNOSIS — R1084 Generalized abdominal pain: Secondary | ICD-10-CM | POA: Diagnosis not present

## 2022-09-24 ENCOUNTER — Other Ambulatory Visit: Payer: Self-pay | Admitting: Family Medicine

## 2022-09-24 DIAGNOSIS — Z86711 Personal history of pulmonary embolism: Secondary | ICD-10-CM

## 2022-09-29 DIAGNOSIS — Z20822 Contact with and (suspected) exposure to covid-19: Secondary | ICD-10-CM | POA: Diagnosis not present

## 2022-09-29 DIAGNOSIS — M791 Myalgia, unspecified site: Secondary | ICD-10-CM | POA: Diagnosis not present

## 2022-09-29 DIAGNOSIS — R062 Wheezing: Secondary | ICD-10-CM | POA: Diagnosis not present

## 2022-10-01 ENCOUNTER — Other Ambulatory Visit: Payer: Self-pay | Admitting: Family Medicine

## 2022-10-15 DIAGNOSIS — Z881 Allergy status to other antibiotic agents status: Secondary | ICD-10-CM | POA: Diagnosis not present

## 2022-10-15 DIAGNOSIS — K9189 Other postprocedural complications and disorders of digestive system: Secondary | ICD-10-CM | POA: Diagnosis not present

## 2022-10-15 DIAGNOSIS — Z9049 Acquired absence of other specified parts of digestive tract: Secondary | ICD-10-CM | POA: Diagnosis not present

## 2022-10-15 DIAGNOSIS — Z7982 Long term (current) use of aspirin: Secondary | ICD-10-CM | POA: Diagnosis not present

## 2022-10-15 DIAGNOSIS — K56699 Other intestinal obstruction unspecified as to partial versus complete obstruction: Secondary | ICD-10-CM | POA: Diagnosis not present

## 2022-10-15 DIAGNOSIS — M5432 Sciatica, left side: Secondary | ICD-10-CM | POA: Diagnosis not present

## 2022-10-15 DIAGNOSIS — Z86711 Personal history of pulmonary embolism: Secondary | ICD-10-CM | POA: Diagnosis not present

## 2022-10-15 DIAGNOSIS — M797 Fibromyalgia: Secondary | ICD-10-CM | POA: Diagnosis not present

## 2022-10-15 DIAGNOSIS — Z885 Allergy status to narcotic agent status: Secondary | ICD-10-CM | POA: Diagnosis not present

## 2022-10-15 DIAGNOSIS — Z980 Intestinal bypass and anastomosis status: Secondary | ICD-10-CM | POA: Diagnosis not present

## 2022-10-15 DIAGNOSIS — Z8614 Personal history of Methicillin resistant Staphylococcus aureus infection: Secondary | ICD-10-CM | POA: Diagnosis not present

## 2022-10-15 DIAGNOSIS — Z8673 Personal history of transient ischemic attack (TIA), and cerebral infarction without residual deficits: Secondary | ICD-10-CM | POA: Diagnosis not present

## 2022-10-15 DIAGNOSIS — Z8719 Personal history of other diseases of the digestive system: Secondary | ICD-10-CM | POA: Diagnosis not present

## 2022-10-15 DIAGNOSIS — E039 Hypothyroidism, unspecified: Secondary | ICD-10-CM | POA: Diagnosis not present

## 2022-10-15 DIAGNOSIS — K9131 Postprocedural partial intestinal obstruction: Secondary | ICD-10-CM | POA: Diagnosis not present

## 2022-10-15 DIAGNOSIS — I480 Paroxysmal atrial fibrillation: Secondary | ICD-10-CM | POA: Diagnosis not present

## 2022-10-15 DIAGNOSIS — M5416 Radiculopathy, lumbar region: Secondary | ICD-10-CM | POA: Diagnosis not present

## 2022-10-15 DIAGNOSIS — M5431 Sciatica, right side: Secondary | ICD-10-CM | POA: Diagnosis not present

## 2022-11-01 ENCOUNTER — Other Ambulatory Visit (HOSPITAL_COMMUNITY): Payer: Self-pay | Admitting: Physician Assistant

## 2022-11-01 DIAGNOSIS — E538 Deficiency of other specified B group vitamins: Secondary | ICD-10-CM

## 2022-11-04 ENCOUNTER — Other Ambulatory Visit: Payer: Self-pay | Admitting: Family Medicine

## 2022-11-13 ENCOUNTER — Ambulatory Visit: Payer: Medicare Other | Admitting: Family Medicine

## 2022-11-16 ENCOUNTER — Other Ambulatory Visit: Payer: Self-pay | Admitting: *Deleted

## 2022-11-16 DIAGNOSIS — E538 Deficiency of other specified B group vitamins: Secondary | ICD-10-CM

## 2022-11-16 MED ORDER — CYANOCOBALAMIN 1000 MCG/ML IJ SOLN: 1000.0000 ug | INTRAMUSCULAR | 11 refills | Status: DC

## 2022-11-17 ENCOUNTER — Institutional Professional Consult (permissible substitution): Payer: Medicare Other | Admitting: Plastic Surgery

## 2022-11-30 ENCOUNTER — Other Ambulatory Visit: Payer: Self-pay | Admitting: Family Medicine

## 2022-11-30 DIAGNOSIS — Z86711 Personal history of pulmonary embolism: Secondary | ICD-10-CM

## 2022-12-02 ENCOUNTER — Ambulatory Visit (INDEPENDENT_AMBULATORY_CARE_PROVIDER_SITE_OTHER): Payer: Medicare Other | Admitting: Family Medicine

## 2022-12-02 ENCOUNTER — Encounter: Payer: Self-pay | Admitting: Family Medicine

## 2022-12-02 VITALS — BP 111/65 | HR 94 | Ht 64.0 in | Wt 180.1 lb

## 2022-12-02 DIAGNOSIS — E038 Other specified hypothyroidism: Secondary | ICD-10-CM

## 2022-12-02 DIAGNOSIS — M5416 Radiculopathy, lumbar region: Secondary | ICD-10-CM | POA: Diagnosis not present

## 2022-12-02 DIAGNOSIS — E785 Hyperlipidemia, unspecified: Secondary | ICD-10-CM

## 2022-12-02 DIAGNOSIS — B351 Tinea unguium: Secondary | ICD-10-CM

## 2022-12-02 DIAGNOSIS — M48061 Spinal stenosis, lumbar region without neurogenic claudication: Secondary | ICD-10-CM

## 2022-12-02 DIAGNOSIS — N761 Subacute and chronic vaginitis: Secondary | ICD-10-CM

## 2022-12-02 DIAGNOSIS — Z86711 Personal history of pulmonary embolism: Secondary | ICD-10-CM

## 2022-12-02 DIAGNOSIS — F5104 Psychophysiologic insomnia: Secondary | ICD-10-CM

## 2022-12-02 DIAGNOSIS — Z86718 Personal history of other venous thrombosis and embolism: Secondary | ICD-10-CM | POA: Diagnosis not present

## 2022-12-02 DIAGNOSIS — M791 Myalgia, unspecified site: Secondary | ICD-10-CM

## 2022-12-02 DIAGNOSIS — K5989 Other specified functional intestinal disorders: Secondary | ICD-10-CM

## 2022-12-02 MED ORDER — TERBINAFINE HCL 250 MG PO TABS: 250.0000 mg | ORAL_TABLET | Freq: Every day | ORAL | 1 refills | Status: DC

## 2022-12-02 MED ORDER — MICONAZOLE NITRATE 200 MG VA SUPP: 200.0000 mg | Freq: Every day | VAGINAL | 3 refills | Status: DC

## 2022-12-02 MED ORDER — METHYLPREDNISOLONE ACETATE 80 MG/ML IJ SUSP
80.0000 mg | Freq: Once | INTRAMUSCULAR | Status: AC
  Administered 2022-12-02: 80 mg via INTRAMUSCULAR

## 2022-12-02 MED ORDER — KETOROLAC TROMETHAMINE 60 MG/2ML IM SOLN
60.0000 mg | Freq: Once | INTRAMUSCULAR | Status: AC
  Administered 2022-12-02: 60 mg via INTRAMUSCULAR

## 2022-12-02 NOTE — Patient Instructions (Signed)
F/U in 4 months , call if you need me sooner  You need an appointment for GI, I will send a  message also.  Please schedule.  Toradol 60 mg and Depo-Medrol 80 mg IM in the office today for back pain.  12-week prescription sent in for fungal nail infection   Topical inserts for recurrent vaginal yeast infection is prescribed.  Lipid CMP and EGFR and TSH today.  Thanks for choosing [facility], we consider it a privelige to serve you.

## 2022-12-03 LAB — LIPID PANEL
Chol/HDL Ratio: 2.7 ratio (ref 0.0–4.4)
Cholesterol, Total: 136 mg/dL (ref 100–199)
HDL: 50 mg/dL (ref >39)
LDL Chol Calc (NIH): 58 mg/dL (ref 0–99)
Triglycerides: 165 mg/dL — ABNORMAL HIGH (ref 0–149)
VLDL Cholesterol Cal: 28 mg/dL (ref 5–40)

## 2022-12-03 LAB — CMP14+EGFR
ALT: 24 IU/L (ref 0–32)
AST: 20 IU/L (ref 0–40)
Albumin/Globulin Ratio: 1.9 (ref 1.2–2.2)
Albumin: 4.2 g/dL (ref 3.9–4.9)
Alkaline Phosphatase: 131 IU/L — ABNORMAL HIGH (ref 44–121)
BUN/Creatinine Ratio: 11 — ABNORMAL LOW (ref 12–28)
BUN: 9 mg/dL (ref 8–27)
Bilirubin Total: 0.4 mg/dL (ref 0.0–1.2)
CO2: 24 mmol/L (ref 20–29)
Calcium: 9.7 mg/dL (ref 8.7–10.3)
Chloride: 103 mmol/L (ref 96–106)
Creatinine, Ser: 0.82 mg/dL (ref 0.57–1.00)
Globulin, Total: 2.2 g/dL (ref 1.5–4.5)
Glucose: 101 mg/dL — ABNORMAL HIGH (ref 70–99)
Potassium: 4.6 mmol/L (ref 3.5–5.2)
Sodium: 143 mmol/L (ref 134–144)
Total Protein: 6.4 g/dL (ref 6.0–8.5)
eGFR: 81 mL/min/{1.73_m2} (ref >59)

## 2022-12-03 LAB — TSH: TSH: 2.61 u[IU]/mL (ref 0.450–4.500)

## 2022-12-04 ENCOUNTER — Other Ambulatory Visit: Payer: Self-pay | Admitting: Family Medicine

## 2022-12-04 DIAGNOSIS — E038 Other specified hypothyroidism: Secondary | ICD-10-CM

## 2022-12-06 ENCOUNTER — Encounter: Payer: Self-pay | Admitting: Family Medicine

## 2022-12-06 DIAGNOSIS — N760 Acute vaginitis: Secondary | ICD-10-CM | POA: Insufficient documentation

## 2022-12-06 DIAGNOSIS — B351 Tinea unguium: Secondary | ICD-10-CM | POA: Insufficient documentation

## 2022-12-06 NOTE — Assessment & Plan Note (Signed)
Maintained on xarelto, no bleeding complications reported

## 2022-12-06 NOTE — Assessment & Plan Note (Signed)
Hyperlipidemia:Low fat diet discussed and encouraged.   Lipid Panel  Lab Results  Component Value Date   CHOL 136 12/02/2022   HDL 50 12/02/2022   LDLCALC 58 12/02/2022   LDLDIRECT 138 (H) 09/15/2012   TRIG 165 (H) 12/02/2022   CHOLHDL 2.7 12/02/2022    Needs to lower fat intake

## 2022-12-06 NOTE — Assessment & Plan Note (Signed)
Increased and uncontrolled back pain, toradol 60 mg and depo medrol 80 mg IM in office, chronic pain management through pain clinic

## 2022-12-06 NOTE — Assessment & Plan Note (Signed)
Lab updated and controlled on current med dose

## 2022-12-06 NOTE — Assessment & Plan Note (Signed)
Terbinafine x 12 weeks 

## 2022-12-06 NOTE — Assessment & Plan Note (Addendum)
Sleep hygiene reviewed and written information offered also.continue current med

## 2022-12-06 NOTE — Assessment & Plan Note (Signed)
Maintained on xarelto

## 2022-12-06 NOTE — Progress Notes (Signed)
   [name]     MRN: [id number]      DOB: 10-May-1961   HPI [name] is here for follow up and re-evaluation of chronic medical conditions, medication management and review of any available recent lab and radiology data.  Preventive health is updated, specifically  Cancer screening and Immunization.   Seen at [facility], still hoping for bowel transplant but has not seen [facility] for this, needs to return to local GI for referral. Had dilation of bowel, still having recurrent pain, bloating and problems with constipation. The PT denies any adverse reactions to current medications since the last visit.  C/o increased and uncontrolled back pain requesting injection for this, she is happy with new Pain clinic   ROS Denies recent fever or chills. Denies sinus pressure, nasal congestion, ear pain or sore throat. Denies chest congestion, productive cough or wheezing. Denies chest pains, palpitations and leg swelling    Denies dysuria, frequency, hesitancy or incontinence.  Denies headaches, seizures, numbness, or tingling. Denies depression, anxiety or insomnia. Denies skin break down or rash.   PE  BP 111/65 (BP Location: Right Arm, Patient Position: Sitting, Cuff Size: Large)   Pulse 94   Ht '5\' 4"'$  (1.626 m)   Wt 180 lb 1.3 oz (81.7 kg)   SpO2 95%   BMI 30.91 kg/m   Patient alert and oriented and in no cardiopulmonary distress.  HEENT: No facial asymmetry, EOMI,     Neck supple .  Chest: Clear to auscultation bilaterally.  CVS: S1, S2 no murmurs, no S3.Regular rate.    Ext: No edema  MS: Decreased  ROM spine,adequate in  shoulders, hips and knees.  Skin: Intact, no ulcerations or rash noted.onychomycosis  Psych: Good eye contact, normal affect. Memory intact not anxious or depressed appearing.  CNS: CN 2-12 intact, power,  normal throughout.no focal deficits noted.   Assessment & Plan  Spinal stenosis of lumbar region with radiculopathy Increased and  uncontrolled back pain, toradol 60 mg and depo medrol 80 mg IM in office, chronic pain management through pain clinic  Hypothyroidism Lab updated and controlled on current med dose  History of DVT (deep vein thrombosis) Maintained on xarelto  History of pulmonary embolus (PE) Maintained on xarelto, no bleeding complications reported  Hyperlipidemia LDL goal <100 Hyperlipidemia:Low fat diet discussed and encouraged.   Lipid Panel  Lab Results  Component Value Date   CHOL 136 12/02/2022   HDL 50 12/02/2022   LDLCALC 58 12/02/2022   LDLDIRECT 138 (H) 09/15/2012   TRIG 165 (H) 12/02/2022   CHOLHDL 2.7 12/02/2022    Needs to lower fat intake   Insomnia Sleep hygiene reviewed and written information offered also.continue current med  Generalized intestinal dysmotility C/o recurrent abdominal pain and distension, needs GI follow up appt, states has been hoping for transplant and has not seen th Specilaist asyet, needs f/u with local GI to address  Onychomycosis Terbinafine x 12 weeks  Vaginitis Recurrent vaginitis, maintained on daily antibiotics by GI, miconazole prescribed for as needed use

## 2022-12-06 NOTE — Assessment & Plan Note (Signed)
C/o recurrent abdominal pain and distension, needs GI follow up appt, states has been hoping for transplant and has not seen th Specilaist asyet, needs f/u with local GI to address

## 2022-12-06 NOTE — Assessment & Plan Note (Signed)
Recurrent vaginitis, maintained on daily antibiotics by GI, miconazole prescribed for as needed use

## 2022-12-11 ENCOUNTER — Other Ambulatory Visit: Payer: Self-pay | Admitting: Family Medicine

## 2022-12-13 ENCOUNTER — Other Ambulatory Visit: Payer: Self-pay | Admitting: Family Medicine

## 2022-12-14 ENCOUNTER — Encounter (INDEPENDENT_AMBULATORY_CARE_PROVIDER_SITE_OTHER): Payer: Self-pay

## 2022-12-14 MED ORDER — POTASSIUM CHLORIDE CRYS ER 10 MEQ PO TBCR: 10.0000 meq | EXTENDED_RELEASE_TABLET | Freq: Every day | ORAL | 0 refills | Status: DC

## 2022-12-16 ENCOUNTER — Ambulatory Visit: Payer: Medicare Other | Admitting: Family Medicine

## 2022-12-16 DIAGNOSIS — M545 Low back pain, unspecified: Secondary | ICD-10-CM | POA: Diagnosis not present

## 2022-12-16 DIAGNOSIS — K599 Functional intestinal disorder, unspecified: Secondary | ICD-10-CM | POA: Diagnosis not present

## 2022-12-16 DIAGNOSIS — Z79891 Long term (current) use of opiate analgesic: Secondary | ICD-10-CM | POA: Diagnosis not present

## 2022-12-16 DIAGNOSIS — K56600 Partial intestinal obstruction, unspecified as to cause: Secondary | ICD-10-CM | POA: Diagnosis not present

## 2022-12-16 DIAGNOSIS — R1084 Generalized abdominal pain: Secondary | ICD-10-CM | POA: Diagnosis not present

## 2022-12-16 DIAGNOSIS — K3184 Gastroparesis: Secondary | ICD-10-CM | POA: Diagnosis not present

## 2022-12-16 DIAGNOSIS — G43101 Migraine with aura, not intractable, with status migrainosus: Secondary | ICD-10-CM | POA: Diagnosis not present

## 2022-12-16 NOTE — Telephone Encounter (Signed)
Ann could you please check on this??? Thanks,

## 2022-12-25 ENCOUNTER — Telehealth: Payer: Self-pay | Admitting: Family Medicine

## 2022-12-25 NOTE — Telephone Encounter (Signed)
Needs another referral for pain clinic referral

## 2022-12-25 NOTE — Telephone Encounter (Signed)
Patient called in regard to pain clinic closing.  Patient has been refereed to [facility] spine and back , but patient states that all they do is back injections.  Patient would like a call back to get help with new pain management referral.

## 2022-12-29 ENCOUNTER — Other Ambulatory Visit: Payer: Self-pay | Admitting: Family Medicine

## 2022-12-29 DIAGNOSIS — M797 Fibromyalgia: Secondary | ICD-10-CM

## 2022-12-29 DIAGNOSIS — R52 Pain, unspecified: Secondary | ICD-10-CM

## 2022-12-29 DIAGNOSIS — G8929 Other chronic pain: Secondary | ICD-10-CM

## 2022-12-29 NOTE — Telephone Encounter (Signed)
Referral entered  

## 2023-01-05 ENCOUNTER — Encounter (HOSPITAL_COMMUNITY): Payer: Self-pay | Admitting: Hematology

## 2023-01-12 ENCOUNTER — Inpatient Hospital Stay: Payer: Medicare Other

## 2023-01-13 DIAGNOSIS — Z79899 Other long term (current) drug therapy: Secondary | ICD-10-CM | POA: Diagnosis not present

## 2023-01-13 DIAGNOSIS — R1084 Generalized abdominal pain: Secondary | ICD-10-CM | POA: Diagnosis not present

## 2023-01-13 DIAGNOSIS — M129 Arthropathy, unspecified: Secondary | ICD-10-CM | POA: Diagnosis not present

## 2023-01-13 DIAGNOSIS — E559 Vitamin D deficiency, unspecified: Secondary | ICD-10-CM | POA: Diagnosis not present

## 2023-01-13 DIAGNOSIS — G894 Chronic pain syndrome: Secondary | ICD-10-CM | POA: Diagnosis not present

## 2023-01-14 ENCOUNTER — Telehealth: Payer: Self-pay | Admitting: Family Medicine

## 2023-01-14 NOTE — Telephone Encounter (Signed)
[name], [facility]  Called stating that she is needing pt insu info & more clinical notes sent over.

## 2023-01-16 ENCOUNTER — Other Ambulatory Visit (INDEPENDENT_AMBULATORY_CARE_PROVIDER_SITE_OTHER): Payer: Self-pay | Admitting: Gastroenterology

## 2023-01-19 ENCOUNTER — Ambulatory Visit: Payer: Medicare Other | Admitting: Physician Assistant

## 2023-01-22 ENCOUNTER — Inpatient Hospital Stay: Payer: Medicare Other

## 2023-01-26 ENCOUNTER — Other Ambulatory Visit: Payer: Self-pay | Admitting: Family Medicine

## 2023-01-26 DIAGNOSIS — Z86711 Personal history of pulmonary embolism: Secondary | ICD-10-CM

## 2023-01-28 ENCOUNTER — Other Ambulatory Visit: Payer: Self-pay | Admitting: Family Medicine

## 2023-01-29 ENCOUNTER — Ambulatory Visit: Payer: Self-pay | Admitting: Physician Assistant

## 2023-02-04 ENCOUNTER — Encounter (HOSPITAL_COMMUNITY): Payer: Self-pay | Admitting: Hematology

## 2023-02-08 ENCOUNTER — Inpatient Hospital Stay: Payer: Medicare Other | Attending: Hematology

## 2023-02-08 DIAGNOSIS — D509 Iron deficiency anemia, unspecified: Secondary | ICD-10-CM | POA: Insufficient documentation

## 2023-02-08 DIAGNOSIS — Z8719 Personal history of other diseases of the digestive system: Secondary | ICD-10-CM | POA: Diagnosis not present

## 2023-02-08 DIAGNOSIS — Z87440 Personal history of urinary (tract) infections: Secondary | ICD-10-CM | POA: Insufficient documentation

## 2023-02-08 DIAGNOSIS — Z853 Personal history of malignant neoplasm of breast: Secondary | ICD-10-CM | POA: Diagnosis not present

## 2023-02-08 DIAGNOSIS — R112 Nausea with vomiting, unspecified: Secondary | ICD-10-CM | POA: Insufficient documentation

## 2023-02-08 DIAGNOSIS — R300 Dysuria: Secondary | ICD-10-CM | POA: Insufficient documentation

## 2023-02-08 DIAGNOSIS — G2581 Restless legs syndrome: Secondary | ICD-10-CM | POA: Diagnosis not present

## 2023-02-08 DIAGNOSIS — M791 Myalgia, unspecified site: Secondary | ICD-10-CM | POA: Diagnosis not present

## 2023-02-08 DIAGNOSIS — R109 Unspecified abdominal pain: Secondary | ICD-10-CM | POA: Diagnosis not present

## 2023-02-08 DIAGNOSIS — I2699 Other pulmonary embolism without acute cor pulmonale: Secondary | ICD-10-CM | POA: Insufficient documentation

## 2023-02-08 DIAGNOSIS — R197 Diarrhea, unspecified: Secondary | ICD-10-CM | POA: Insufficient documentation

## 2023-02-08 DIAGNOSIS — R519 Headache, unspecified: Secondary | ICD-10-CM | POA: Insufficient documentation

## 2023-02-08 DIAGNOSIS — R634 Abnormal weight loss: Secondary | ICD-10-CM | POA: Insufficient documentation

## 2023-02-08 DIAGNOSIS — Z7901 Long term (current) use of anticoagulants: Secondary | ICD-10-CM | POA: Diagnosis not present

## 2023-02-08 DIAGNOSIS — K5900 Constipation, unspecified: Secondary | ICD-10-CM | POA: Diagnosis not present

## 2023-02-08 DIAGNOSIS — R790 Abnormal level of blood mineral: Secondary | ICD-10-CM

## 2023-02-08 DIAGNOSIS — Z79899 Other long term (current) drug therapy: Secondary | ICD-10-CM | POA: Insufficient documentation

## 2023-02-08 DIAGNOSIS — E538 Deficiency of other specified B group vitamins: Secondary | ICD-10-CM | POA: Insufficient documentation

## 2023-02-08 DIAGNOSIS — F32A Depression, unspecified: Secondary | ICD-10-CM | POA: Diagnosis not present

## 2023-02-08 DIAGNOSIS — R5383 Other fatigue: Secondary | ICD-10-CM | POA: Diagnosis not present

## 2023-02-08 DIAGNOSIS — Z86711 Personal history of pulmonary embolism: Secondary | ICD-10-CM | POA: Insufficient documentation

## 2023-02-08 DIAGNOSIS — R079 Chest pain, unspecified: Secondary | ICD-10-CM | POA: Insufficient documentation

## 2023-02-08 DIAGNOSIS — K909 Intestinal malabsorption, unspecified: Secondary | ICD-10-CM | POA: Insufficient documentation

## 2023-02-08 DIAGNOSIS — R42 Dizziness and giddiness: Secondary | ICD-10-CM | POA: Insufficient documentation

## 2023-02-08 DIAGNOSIS — E559 Vitamin D deficiency, unspecified: Secondary | ICD-10-CM | POA: Diagnosis not present

## 2023-02-08 DIAGNOSIS — R0602 Shortness of breath: Secondary | ICD-10-CM | POA: Insufficient documentation

## 2023-02-08 LAB — CBC WITH DIFFERENTIAL/PLATELET
Abs Immature Granulocytes: 0.02 10*3/uL (ref 0.00–0.07)
Basophils Absolute: 0 10*3/uL (ref 0.0–0.1)
Basophils Relative: 1 %
Eosinophils Absolute: 0.1 10*3/uL (ref 0.0–0.5)
Eosinophils Relative: 2 %
HCT: 46.5 % — ABNORMAL HIGH (ref 36.0–46.0)
Hemoglobin: 15.3 g/dL — ABNORMAL HIGH (ref 12.0–15.0)
Immature Granulocytes: 0 %
Lymphocytes Relative: 27 %
Lymphs Abs: 2 10*3/uL (ref 0.7–4.0)
MCH: 29.8 pg (ref 26.0–34.0)
MCHC: 32.9 g/dL (ref 30.0–36.0)
MCV: 90.5 fL (ref 80.0–100.0)
Monocytes Absolute: 0.5 10*3/uL (ref 0.1–1.0)
Monocytes Relative: 7 %
Neutro Abs: 4.6 10*3/uL (ref 1.7–7.7)
Neutrophils Relative %: 63 %
Platelets: 283 10*3/uL (ref 150–400)
RBC: 5.14 MIL/uL — ABNORMAL HIGH (ref 3.87–5.11)
RDW: 13.2 % (ref 11.5–15.5)
WBC: 7.2 10*3/uL (ref 4.0–10.5)
nRBC: 0 % (ref 0.0–0.2)

## 2023-02-08 LAB — IRON AND TIBC
Iron: 82 ug/dL (ref 28–170)
Saturation Ratios: 22 % (ref 10.4–31.8)
TIBC: 380 ug/dL (ref 250–450)
UIBC: 298 ug/dL

## 2023-02-08 LAB — FERRITIN: Ferritin: 117 ng/mL (ref 11–307)

## 2023-02-08 LAB — VITAMIN B12: Vitamin B-12: 464 pg/mL (ref 180–914)

## 2023-02-12 LAB — METHYLMALONIC ACID, SERUM: Methylmalonic Acid, Quantitative: 119 nmol/L (ref 0–378)

## 2023-02-15 DIAGNOSIS — R030 Elevated blood-pressure reading, without diagnosis of hypertension: Secondary | ICD-10-CM | POA: Diagnosis not present

## 2023-02-15 DIAGNOSIS — R1084 Generalized abdominal pain: Secondary | ICD-10-CM | POA: Diagnosis not present

## 2023-02-15 DIAGNOSIS — Z6831 Body mass index (BMI) 31.0-31.9, adult: Secondary | ICD-10-CM | POA: Diagnosis not present

## 2023-02-15 DIAGNOSIS — Z79899 Other long term (current) drug therapy: Secondary | ICD-10-CM | POA: Diagnosis not present

## 2023-02-16 ENCOUNTER — Inpatient Hospital Stay (HOSPITAL_BASED_OUTPATIENT_CLINIC_OR_DEPARTMENT_OTHER): Payer: Medicare Other | Admitting: Physician Assistant

## 2023-02-16 ENCOUNTER — Inpatient Hospital Stay: Payer: Medicare Other | Admitting: Physician Assistant

## 2023-02-16 DIAGNOSIS — K90829 Short bowel syndrome, unspecified: Secondary | ICD-10-CM

## 2023-02-16 DIAGNOSIS — E538 Deficiency of other specified B group vitamins: Secondary | ICD-10-CM

## 2023-02-16 DIAGNOSIS — E559 Vitamin D deficiency, unspecified: Secondary | ICD-10-CM | POA: Diagnosis not present

## 2023-02-16 DIAGNOSIS — D509 Iron deficiency anemia, unspecified: Secondary | ICD-10-CM

## 2023-02-16 DIAGNOSIS — R790 Abnormal level of blood mineral: Secondary | ICD-10-CM

## 2023-02-16 NOTE — Progress Notes (Addendum)
VIRTUAL VISIT via [place]   I connected with [name]  on 02/16/2023 at 3:20 PM by telephone and verified that I am speaking with the correct person using two identifiers.  Location: Patient: Home Provider: [facility]   I discussed the limitations, risks, security and privacy concerns of performing an evaluation and management service by telephone and the availability of in person appointments. I also discussed with the patient that there may be a patient responsible charge related to this service. The patient expressed understanding and agreed to proceed.  REASON FOR VISIT:  Follow-up for iron deficiency anemia   CURRENT THERAPY: IV iron (last INFED on 01/08/2022)  INTERVAL HISTORY:  [name] [id number] y.o. female) is contacted today for follow-up of her iron deficiency anemia.  She was last evaluated via telemedicine visit by Tarri Abernethy PA-C on 09/11/2022.  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  She also has history of Paget's disease of the left breast which was resected with mastectomy in 2006.  She also had unprovoked pulmonary embolism in 1997 and is on Xarelto.   [name] has multiple medical complaints at today's visit related to her high burden of chronic disease and multiple medical issues, particularly her gastroparesis and ongoing complications from her previous abdominal surgeries.  She has not had any hospitalizations since her last visit with me in September 2023.  She had a UTI earlier this month.   She continues to have chronic fatigue, which has been somewhat worsened lately.  She currently reports that she is "completely wiped out."  She has diffuse myalgias and feels "achy all over."  She reports stable restless legs, headaches, dyspnea on exertion.  She continues to have episodes of lightheadedness, "room spinning," and her  arms and legs feeling numb.  She has previously had issues with chest pain for the past several years, which she reports has had negative cardiac work-up - she reports that she reached out to her cardiologist again due to worsening episodes of chest pain.   She continues to take Xarelto due to her history of unprovoked pulmonary embolisms.  She is tolerating it well without major bleeding events.  She denies any epistaxis, hematemesis, hematochezia, or melena.   She has not had any new unilateral leg swelling, pain, or erythema.   She denies any new breast lumps or lymphadenopathy.  Diagnostic mammogram from 06/22/2022 was BI-RADS Category 1, negative.   She has had significant weight loss over the past several months, which she believes is from her gastroparesis and other GI issues.  She reports 25% energy and 75% appetite.   REVIEW OF SYSTEMS:   Review of Systems  Constitutional:  Positive for malaise/fatigue and weight loss. Negative for chills, diaphoresis and fever.  Respiratory:  Positive for shortness of breath. Negative for cough.   Cardiovascular:  Positive for chest pain. Negative for palpitations.  Gastrointestinal:  Positive for abdominal pain, constipation, diarrhea, nausea and vomiting. Negative for blood in stool and melena.  Genitourinary:  Positive for dysuria.  Neurological:  Positive for dizziness, tingling and headaches.  Psychiatric/Behavioral:  Positive for depression. The patient is nervous/anxious and has insomnia.      PHYSICAL EXAM: (per limitations of virtual telephone visit)  The patient is alert and oriented x 3, exhibiting adequate mentation, good mood, and ability to speak in full sentences and execute sound judgement.  ASSESSMENT & PLAN:  1.  Iron deficiency anemia: - History of iron deficiency status secondary to multiple bowel resections and malabsorption. - Previous work-up (02/13/2021) showed normal LDH.  Folic acid and 123456, copper and methylmalonic acid  were normal.  SPEP was negative.  CA 15-3 and TSH were also normal. - Most recent EGD (03/31/2022) showed Barrett's esophagus with normal stomach.  Single duodenal polyp resected. - Most recent colonoscopy (05/02/2021) was limited by liquid stool in the colon, showed nonbleeding internal hemorrhoids - Patient was given IV Venofer on 02/18/2021 but had a reaction to Venofer.  Most recently received received INFeD infusion on 01/08/2022, which she tolerated after premedication was given. - No bright red blood per rectum or melena - Most recent labs (02/08/2023): Hgb 15.3/MCV 46.5, ferritin 117, iron saturation 22% - PLAN: No indication for IV iron at this time. - We will check CBC and iron panel in 3 months (labs only).  Labs and office visit in 6 months.   2.  Unprovoked pulmonary embolism: - Most recent D-dimer is negative.  No current signs or symptoms of DVT/PE. - Patient is worried that she is not digesting her medications, since she is having issues with GI motility, gastroparesis, and frequent vomiting. - We discussed that she could change from Xarelto to Lovenox, but she prefers to hold off on this for the time being.  She will discuss with her other specialists and reach out to our clinic if she decides to change the formulation of her anticoagulant. - PLAN: Continue Xarelto.  Would consider switching to Lovenox in the future if there is persistent concern for GI absorption of medications in the setting of GI motility issues and gastroparesis.    3.  History of Paget's disease of left breast - History of Paget's disease of the left breast which was resected with mastectomy in 2006. - Most recent mammogram 06/02/2021: BI-RADS Category 1 negative - Most recent diagnostic mammogram (06/22/2022): BI-RADS Category 1, negative - She reports itching in her right nipple.  Patient reports that she was also told that her right breast implant has been recalled and she hopes to get this removed in the future. -  She had negative BRCA testing in 2006.  She request referral to genetic counselor to see if she qualifies for repeat testing. - PLAN: Continue annual mammograms and breast exams via PCP and breast center.  If any findings concerning for malignancy are discovered, she will need to be referred back to [place] as a new patient for evaluation by Dr. Delton Coombes. - Referral placed to genetic counselor per patient request.   4.  B12 deficiency secondary to intestinal malabsorption - She has been self-administering B12 injections - Most recent B12 and MMA are normal (02/08/2023) - PLAN: Continue B12 injections.  We will check levels at follow-up in 6 months.   5.  Vitamin D deficiency - Prior vitamin D level (04/29/2022) showed vitamin D overload at 131.72 in the setting of vitamin D 50,000 units weekly - She is currently taking vitamin D 50,000 units monthly - Most recent vitamin D (02/08/2023) is PENDING - PLAN: Continue vitamin D 50,000 units monthly. - We will recheck at follow-up visit in 6 months   PLAN SUMMARY: >> Labs only in 3 months = CBC/D, ferritin, iron/TIBC (will watch for results and have nurse call if any change to treatment plan) >> Labs in 6 months = CBC/D, CMP, ferritin, iron/TIBC, B12, MMA, vitamin D >> OFFICE visit in 6 months (1 week after labs)  I discussed the assessment and treatment plan with the patient. The patient was provided an opportunity to ask questions and all were answered. The patient agreed with the plan and demonstrated an understanding of the instructions.   The patient was advised to call back or seek an in-person evaluation if the symptoms worsen or if the condition fails to improve as anticipated.  I provided 22 minutes of non-face-to-face time during this encounter.   Harriett Rush, PA-C 02/16/23 3:44 PM

## 2023-02-19 LAB — VITAMIN D 25 HYDROXY (VIT D DEFICIENCY, FRACTURES)

## 2023-03-02 ENCOUNTER — Other Ambulatory Visit (INDEPENDENT_AMBULATORY_CARE_PROVIDER_SITE_OTHER): Payer: Self-pay | Admitting: Gastroenterology

## 2023-03-03 ENCOUNTER — Telehealth (INDEPENDENT_AMBULATORY_CARE_PROVIDER_SITE_OTHER): Payer: Self-pay

## 2023-03-03 NOTE — Telephone Encounter (Signed)
I had to call the patient today due to an refill request. I asked if we were still seeing her as She was seeing a Office manager at [employer]. Patient says she is seeing them and [place] and needed a refill on her Amitiza, I did send this to [facility], but advised she would need to set up an appointment here to get these filled. Patient agreed to this and was transferred to the front for an appointment. Patient did state she has not heard anything from the Transplant team as the Resident at the [facility] doctor in [place] told her yesterday, that she needed to come off of any pain medication that she may be taking in order to get on the list. She says she found out this was not true. She says she has not heard anything from the transplant team at [facility].

## 2023-03-03 NOTE — Telephone Encounter (Signed)
Patient says she was referred to [facility] we are still seeing her also. She says they do not prescribe the Amitiza to her and she will need refills. I advised that she needed a follow up appointment here in order to get this refilled. Transferred her to the front to set up an appointment.

## 2023-03-03 NOTE — Telephone Encounter (Signed)
Not very sure about this. However, I do not see any note from the small bowel transplant team. Lelon Frohlich, do you know anything about a referral to [facility] for small bowel transplant? If not in, can we please refer the patient? Thanks

## 2023-03-03 NOTE — Telephone Encounter (Signed)
I called and left a message asked that patient please return call.  ?

## 2023-03-03 NOTE — Telephone Encounter (Signed)
Ann, please see note regarding referral

## 2023-03-04 ENCOUNTER — Encounter (HOSPITAL_COMMUNITY): Payer: Self-pay | Admitting: Hematology

## 2023-03-04 NOTE — Telephone Encounter (Signed)
Thanks

## 2023-03-04 NOTE — Telephone Encounter (Signed)
Referral has been sent to [facility]

## 2023-03-08 DIAGNOSIS — E1165 Type 2 diabetes mellitus with hyperglycemia: Secondary | ICD-10-CM | POA: Diagnosis not present

## 2023-03-08 DIAGNOSIS — Z853 Personal history of malignant neoplasm of breast: Secondary | ICD-10-CM | POA: Diagnosis not present

## 2023-03-08 DIAGNOSIS — I480 Paroxysmal atrial fibrillation: Secondary | ICD-10-CM | POA: Diagnosis not present

## 2023-03-08 DIAGNOSIS — D6869 Other thrombophilia: Secondary | ICD-10-CM | POA: Diagnosis not present

## 2023-03-09 ENCOUNTER — Telehealth: Payer: Self-pay | Admitting: Family Medicine

## 2023-03-09 NOTE — Telephone Encounter (Signed)
[employer] nurse came by yesterday & told pt that when she was in her hospital on 08/06/22 she was tested for Sjogren's syndrome and it came back positive. She is wanting to let you know.

## 2023-03-13 ENCOUNTER — Other Ambulatory Visit: Payer: Self-pay | Admitting: Family Medicine

## 2023-03-15 DIAGNOSIS — M3500 Sicca syndrome, unspecified: Secondary | ICD-10-CM | POA: Diagnosis not present

## 2023-03-15 DIAGNOSIS — R1084 Generalized abdominal pain: Secondary | ICD-10-CM | POA: Diagnosis not present

## 2023-03-15 DIAGNOSIS — Z6832 Body mass index (BMI) 32.0-32.9, adult: Secondary | ICD-10-CM | POA: Diagnosis not present

## 2023-03-15 DIAGNOSIS — G894 Chronic pain syndrome: Secondary | ICD-10-CM | POA: Diagnosis not present

## 2023-03-15 DIAGNOSIS — R030 Elevated blood-pressure reading, without diagnosis of hypertension: Secondary | ICD-10-CM | POA: Diagnosis not present

## 2023-03-22 DIAGNOSIS — M79675 Pain in left toe(s): Secondary | ICD-10-CM | POA: Diagnosis not present

## 2023-03-22 DIAGNOSIS — L609 Nail disorder, unspecified: Secondary | ICD-10-CM | POA: Diagnosis not present

## 2023-03-22 DIAGNOSIS — L608 Other nail disorders: Secondary | ICD-10-CM | POA: Diagnosis not present

## 2023-03-22 DIAGNOSIS — M79672 Pain in left foot: Secondary | ICD-10-CM | POA: Diagnosis not present

## 2023-03-22 DIAGNOSIS — B351 Tinea unguium: Secondary | ICD-10-CM | POA: Diagnosis not present

## 2023-03-24 ENCOUNTER — Other Ambulatory Visit: Payer: Self-pay | Admitting: Family Medicine

## 2023-03-24 DIAGNOSIS — Z86711 Personal history of pulmonary embolism: Secondary | ICD-10-CM

## 2023-03-25 ENCOUNTER — Other Ambulatory Visit: Payer: Self-pay | Admitting: Family Medicine

## 2023-03-30 ENCOUNTER — Telehealth: Payer: Self-pay | Admitting: Family Medicine

## 2023-03-30 NOTE — Telephone Encounter (Signed)
Called Rheumatology and left vm with cindy.

## 2023-03-30 NOTE — Telephone Encounter (Signed)
Pt called in regard to Sjogrens Disease. Patient positive on 08/06/22.  Is trying to get records to [facility] but cannot get in to office without records surrounding fifing from Dr. Roselyn Reef ( unknown)   Has already reached out to medical records.  Patient wants a call back in regard.

## 2023-04-08 ENCOUNTER — Ambulatory Visit: Payer: Medicare Other | Admitting: Family Medicine

## 2023-04-08 ENCOUNTER — Encounter (INDEPENDENT_AMBULATORY_CARE_PROVIDER_SITE_OTHER): Payer: Self-pay | Admitting: Gastroenterology

## 2023-04-09 DIAGNOSIS — M3500 Sicca syndrome, unspecified: Secondary | ICD-10-CM | POA: Diagnosis not present

## 2023-04-09 DIAGNOSIS — R030 Elevated blood-pressure reading, without diagnosis of hypertension: Secondary | ICD-10-CM | POA: Diagnosis not present

## 2023-04-09 DIAGNOSIS — G894 Chronic pain syndrome: Secondary | ICD-10-CM | POA: Diagnosis not present

## 2023-04-09 DIAGNOSIS — R1084 Generalized abdominal pain: Secondary | ICD-10-CM | POA: Diagnosis not present

## 2023-04-09 DIAGNOSIS — Z6832 Body mass index (BMI) 32.0-32.9, adult: Secondary | ICD-10-CM | POA: Diagnosis not present

## 2023-04-12 DIAGNOSIS — Z008 Encounter for other general examination: Secondary | ICD-10-CM | POA: Diagnosis not present

## 2023-04-12 DIAGNOSIS — K3184 Gastroparesis: Secondary | ICD-10-CM | POA: Diagnosis not present

## 2023-04-15 ENCOUNTER — Other Ambulatory Visit (HOSPITAL_BASED_OUTPATIENT_CLINIC_OR_DEPARTMENT_OTHER): Payer: Self-pay | Admitting: Family

## 2023-04-15 DIAGNOSIS — L237 Allergic contact dermatitis due to plants, except food: Secondary | ICD-10-CM | POA: Diagnosis not present

## 2023-04-15 DIAGNOSIS — Z6833 Body mass index (BMI) 33.0-33.9, adult: Secondary | ICD-10-CM | POA: Diagnosis not present

## 2023-04-15 DIAGNOSIS — R030 Elevated blood-pressure reading, without diagnosis of hypertension: Secondary | ICD-10-CM | POA: Diagnosis not present

## 2023-04-15 DIAGNOSIS — E669 Obesity, unspecified: Secondary | ICD-10-CM | POA: Diagnosis not present

## 2023-04-15 NOTE — Telephone Encounter (Signed)
Patient of Dr. Chandrasekhar. Please review for refill. Thank you!  

## 2023-04-16 ENCOUNTER — Ambulatory Visit (HOSPITAL_BASED_OUTPATIENT_CLINIC_OR_DEPARTMENT_OTHER): Payer: Medicare Other | Admitting: Family

## 2023-04-16 ENCOUNTER — Encounter (HOSPITAL_BASED_OUTPATIENT_CLINIC_OR_DEPARTMENT_OTHER): Payer: Self-pay | Admitting: Family

## 2023-04-16 ENCOUNTER — Other Ambulatory Visit (INDEPENDENT_AMBULATORY_CARE_PROVIDER_SITE_OTHER): Payer: Medicare Other

## 2023-04-16 VITALS — BP 96/70 | HR 83 | Ht 64.0 in | Wt 190.0 lb

## 2023-04-16 DIAGNOSIS — I471 Supraventricular tachycardia, unspecified: Secondary | ICD-10-CM

## 2023-04-16 DIAGNOSIS — R072 Precordial pain: Secondary | ICD-10-CM | POA: Diagnosis not present

## 2023-04-16 DIAGNOSIS — R002 Palpitations: Secondary | ICD-10-CM

## 2023-04-16 NOTE — Progress Notes (Unsigned)
Office Visit    Patient Name: [name] Date of Encounter: 04/16/2023  PCP:  Kerri Perches, MD   [facility]  Cardiologist:  Christell Constant, MD  Advanced Practice Provider:  No care team member to display Electrophysiologist:  Sherryl Manges, MD      Chief Complaint    [name] is a 62 y.o. female presents today for palpitations  Past Medical History    Past Medical History:  Diagnosis Date   Abdominal hernia 08/14/2019   Abdominal pain 06/06/2019   Acute bronchitis 05/01/2013   Acute cystitis 06/09/2010   Qualifier: Diagnosis of  By: Lillia Mountain LPN, Brandi     Acute renal insufficiency 08/28/2011   Allergy    Phreesia 03/16/2021   Anemia    Phreesia 10/29/2020   Anxiety    Anxiety state 03/16/2008   Qualifier: Diagnosis of  By: Lind Guest     Arthritis    Phreesia 10/29/2020   Back pain with radiation 07/17/2014   Bilateral hand pain 07/27/2016   Blood transfusion without reported diagnosis    Phreesia 10/29/2020   Breast cancer    L breast- 2006   Cancer    Phreesia 10/29/2020   Carpal tunnel syndrome    Chronic constipation    Chronic pain syndrome    followed by [facility]---  back   Clotting disorder    Phreesia 10/29/2020   Depression    Dermatitis 12/15/2011   Difficult intubation    Educated about COVID-19 virus infection 05/14/2019   Fall at home 01/26/2016   Family history of adverse reaction to anesthesia    MOTHER--- PONV   Fatigue 09/17/2012   Fibromyalgia    FIBROMYALGIA 03/16/2008   Qualifier: Diagnosis of  By: Lind Guest     GERD (gastroesophageal reflux disease)    Headache disorder 01/16/2013   Headache syndrome 01/26/2016   Hip pain, right 07/08/2019   History of MRSA infection    lip abscess   History of ovarian cyst 06/2011   s/p  BSO   History of pulmonary embolus (PE) 1997   post EP with ablation pulmonary veouns for SVT/ Atrial Fib.   History of supraventricular  tachycardia    s/p  ablation 1996  and 1997  by dr Graciela Husbands   History of TIA (transient ischemic attack) 1997   post op EP ablation PE   Hyperlipidemia    Hyperlipidemia LDL goal <100 03/16/2008   Qualifier: Diagnosis of  By: Lind Guest     Hypothyroidism    followed by pcp   Incisional hernia    Insomnia 12/15/2011   Intermittent palpitations 08/22/2017   Interstitial cystitis    09-13-2018   per pt last flare-up  May 2019 (followed by pcp)   INTERSTITIAL CYSTITIS 02/17/2011   Qualifier: Diagnosis of  By: Lodema Hong MD, Margaret     Iron deficiency anemia    09-13-2018  PER PT STABLE   Irregular heart rate 07/25/2013   Lipoma of back    upper   Low back pain radiating to right leg 07/04/2019   Low ferritin level 06/14/2014   MDD (major depressive disorder), single episode, in full remission 10/27/2017   Medically noncompliant 02/28/2015   Multiple missed appointments, both follow-up appointments and lab appointments.    Metabolic syndrome X 02/10/2011   Qualifier: Diagnosis of  By: Lodema Hong MD, Margaret  hBA1c is 5.8 in 02/2013    Migraines    Morbid obesity  03/27/2013   Muscle spasm 01/03/2020   Nausea alone 07/17/2014   NECK PAIN, CHRONIC 03/16/2008   Qualifier: Diagnosis of  By: Lind Guest     Normal coronary arteries    a. by CT 12/2018.   Obesity 03/16/2008   Qualifier: Diagnosis of  By: Lind Guest     Oral ulceration 08/23/2014   Presented at 06/14/2014 visit    Other malaise and fatigue 03/16/2008   Centricity Description: FATIGUE, CHRONIC Qualifier: Diagnosis of  By: Lind Guest   Centricity Description: FATIGUE Qualifier: Diagnosis of  By: Garnette Czech PA, Dawn     OVARIAN CYST 12/26/2009   Qualifier: Diagnosis of  By: Lodema Hong MD, Margaret     Paget disease of breast, left    Paget's disease of breast, left 03/16/2008   Qualifier: Diagnosis of  By: Lind Guest  Left diagnosed in 2006 F/h breast cancer x 15 family members   Partial small bowel obstruction 07/04/2012   PONV  (postoperative nausea and vomiting)    SEVERE   Postsurgical menopause 11/13/2011   Presence of IVC filter 06/06/2019   PSVT (paroxysmal supraventricular tachycardia)    HX ABLATION 1996 AND 1997   Recurrent oral herpes simplex infection 10/13/2017   ROM (right otitis media) 01/23/2013   S/P insertion of IVC (inferior vena caval) filter 05/08/2005   [place]field (non-retrievable)  /  dx 2019  a leg of filter is protruding thru the vena cava in to right L2 vertebral body (09-13-2018  per pt having surgery to remove filter in [place])   S/P radiofrequency ablation operation for arrhythmia 1996   1996  and 1997,   SVT and Atrial Fib   Sinusitis, chronic 01/26/2016   SMALL BOWEL OBSTRUCTION, HX OF 08/07/2008   Annotation: obstruction w/ adhesions led to partial colectomy Qualifier: Diagnosis of  By: Minna Merritts     Stroke    Phreesia 10/29/2020, TIAs in the past   Swelling of hand 09/17/2012   Syncope    Tachycardia 02/03/2010   Qualifier: Diagnosis of  By: Via LPN, Lynn     Thyroid disease    Phreesia 10/29/2020   Ulcer 12/15/2011   Urinary frequency 07/18/2014   Vaginitis and vulvovaginitis 05/10/2013   Vitamin D deficiency 05/05/2015   Wears glasses    Past Surgical History:  Procedure Laterality Date   ABDOMINAL HYSTERECTOMY  1987   ANTERIOR CERVICAL DECOMP/DISCECTOMY FUSION  03-07-2002   dr elsner  @[facility]    C 4 -- 5   APPENDECTOMY  1980   AUGMENTATION MAMMAPLASTY Right 2006   BILATERAL SALPINGOOPHORECTOMY  07/24/2011   via Explor. Lap. w/ intraoperative perf. bowel repair   BIOPSY  05/02/2021   Procedure: BIOPSY;  Surgeon: Marguerita Merles, Reuel Boom, MD;  [facility];  Service: Gastroenterology;;  small bowel, mid esophagus, distal esophagus, random colon biopsies   BIOPSY  12/04/2021   Procedure: BIOPSY;  Surgeon: Lanelle Bal, DO;  [facility];  Service: Endoscopy;;   BIOPSY  03/31/2022   Procedure: BIOPSY;  Surgeon: Dolores Frame, MD;  [facility];  Service: Gastroenterology;;   BOTOX INJECTION N/A 03/31/2022   Procedure: BOTOX INJECTION;  Surgeon: Dolores Frame, MD;  [facility];  Service: Gastroenterology;  Laterality: N/A;   BREAST BIOPSY Right 2019   benign   BREAST ENHANCEMENT SURGERY Bilateral 1993   BREAST IMPLANT REMOVAL Bilateral    BREAST SURGERY N/A    Phreesia 10/29/2020   CARDIAC CATHETERIZATION  07-06-2003  dr Smitty Cords brodie   normal coronaries and LVF   CARDIAC ELECTROPHYSIOLOGY STUDY AND ABLATION  1996  and 1997   CARDIOVASCULAR STRESS TEST  11-18-2015   dr Graciela Husbands   normal nuclear study w/ no ischemia/  normal LV function and wall motion , ef 84%   CARPAL TUNNEL RELEASE Right ?   CESAREAN SECTION  1986   CESAREAN SECTION N/A    Phreesia 10/29/2020   CHOLECYSTECTOMY N/A    Phreesia 10/29/2020   COLON SURGERY     COLONOSCOPY WITH PROPOFOL N/A 05/02/2021   Procedure: COLONOSCOPY WITH PROPOFOL;  Surgeon: Dolores Frame, MD;  [facility];  Service: Gastroenterology;  Laterality: N/A;  12:30 PM   CYSTO/  HYDRODISTENTION/  INSTILATION THERAPY  MULTIPLE   ENTEROCUTANEOUS FISTULA CLOSURE  multiple   last one 2015 with small bowel resection   ESOPHAGOGASTRODUODENOSCOPY (EGD) WITH PROPOFOL N/A 05/02/2021   Procedure: ESOPHAGOGASTRODUODENOSCOPY (EGD) WITH PROPOFOL;  Surgeon: Dolores Frame, MD;  [facility];  Service: Gastroenterology;  Laterality: N/A;   ESOPHAGOGASTRODUODENOSCOPY (EGD) WITH PROPOFOL N/A 12/04/2021   Procedure: ESOPHAGOGASTRODUODENOSCOPY (EGD) WITH PROPOFOL;  Surgeon: Lanelle Bal, DO;  [facility];  Service: Endoscopy;  Laterality: N/A;   ESOPHAGOGASTRODUODENOSCOPY (EGD) WITH PROPOFOL N/A 03/31/2022   Procedure: ESOPHAGOGASTRODUODENOSCOPY (EGD) WITH PROPOFOL;  Surgeon: Dolores Frame, MD;  [facility];  Service: Gastroenterology;  Laterality: N/A;  1015   EXPLORATORY LAPAROTOMY INCISIONAL VENTRAL  HERNIA REPAIR / RESECTION SMALL BOWEL  11-09-2014   @[facility]    FRACTURE SURGERY N/A    Phreesia 10/29/2020   JOINT REPLACEMENT N/A    Phreesia 10/29/2020   LIPOMA EXCISION Right 09/16/2018   Procedure: EXCISION LIPOMA UPPER BACK;  Surgeon: Berna Bue, MD;  Location: [facility];  Service: General;  Laterality: Right;   MASTECTOMY Left 2006   w/ reconstruction on left (paget's disease)  and right breast augmentation   POLYPECTOMY  05/02/2021   Procedure: POLYPECTOMY;  Surgeon: Dolores Frame, MD;  [facility];  Service: Gastroenterology;;  gastric   POLYPECTOMY  03/31/2022   Procedure: POLYPECTOMY;  Surgeon: Dolores Frame, MD;  [facility];  Service: Gastroenterology;;   Gaspar Bidding DILATION  05/02/2021   Procedure: Gaspar Bidding DILATION;  Surgeon: Dolores Frame, MD;  [facility];  Service: Gastroenterology;;   SMALL INTESTINE SURGERY N/A    Phreesia 10/29/2020   SPINE SURGERY N/A    Phreesia 10/29/2020   TOTAL COLECTOMY  08-04-2002    @[facility]    AND CHOLECYSTECTOMY  (colonic inertia)   TRANSTHORACIC ECHOCARDIOGRAM  02/04/2016   ef 60-65%,  grade 2 diastolic dysfunction/  mild MR   TUBAL LIGATION N/A    Phreesia 03/16/2021   VENA CAVA FILTER PLACEMENT  05/08/2005   @[facility]    [place] (non-retrievable)   WIDE EXCISION PERIRECTAL ABSCESSES  09-22-2005   @ [facility]    Allergies  Allergies  Allergen Reactions   Nitrofurantoin Hives   Crestor [Rosuvastatin Calcium] Other (See Comments)    Generalized cramps   Bisacodyl Other (See Comments)    Makes patient feel like she is having cramps    Codeine Itching   Iron Sucrose Other (See Comments)    Flushing- required benadryl and solu medrol   Monosodium Glutamate Other (See Comments)    Cluster migraines    Scopolamine Hbr Other (See Comments)    Cluster migraines, impaired vision   Other Hives, Itching, Swelling, Rash and Other (See Comments)  Cigarette smoke     History of Present Illness    [name] is a 62 y.o. female with a hx of HTN, IDA, fibromyalgia, IVC filter last seen 09/14/22.   Last seen 02/10/22 with chest pain. Coronary CTA 01/11/19 calcium score of 0. Repeat cardiac CTA 03/06/22 with calcium score 0. At follow up 09/14/22 atypical chest pain and reassurance provided.   Presents today for follow up. Notes once a day she is having chest pain that radiates to her jaw. It is waking her up at night. Heart did feel fast as well as irregular when it is happening. Happens both at rest and with activity. Notes the runs are lasting longer (previous seconds now minutes or up to an hour). Recently diagnosed with  Sjogren's and has upcoming visit with rheumatology. She is concerned regarding possible hypertension on previous imaging - reassurance provided regarding echocardiogram with normal pulmonary pressures.   EKGs/Labs/Other Studies Reviewed:   The following studies were reviewed today:  Cardiac CTA 02/2022 IMPRESSION: 1. No evidence of CAD, CADRADS = 0.   2. Coronary calcium score of 0. This was 0 percentile for age and sex matched control.   3. Normal coronary origin with left dominance.   4. Dilated main pulmonary artery at 30 mm, suggestive of pulmonary hypertension.   5. Compared to prior CT coronary angiogram ON 01/11/2019, there has been no change  EKG:  EKG is ordered today.  The ekg ordered today demonstrates NSR 83 bpm with no acute ST/T wave changes.   Recent Labs: 07/22/2022: Magnesium 1.6 12/02/2022: ALT 24; BUN 9; Creatinine, Ser 0.82; Potassium 4.6; Sodium 143; TSH 2.610 02/08/2023: Hemoglobin 15.3; Platelets 283  Recent Lipid Panel    Component Value Date/Time   CHOL 136 12/02/2022 1521   TRIG 165 (H) 12/02/2022 1521   HDL 50 12/02/2022 1521   CHOLHDL 2.7 12/02/2022 1521   CHOLHDL 3.4 09/11/2021 1401   VLDL 56 (H) 09/11/2021 1401   LDLCALC 58 12/02/2022 1521   LDLDIRECT 138 (H) 09/15/2012 1540     Home Medications   Current Meds  Medication Sig   ALPRAZolam (XANAX) 1 MG tablet Take one tablet by mouth two times daily for anxiety   aspirin 81 MG chewable tablet Chew 81 mg by mouth daily.   BIOTIN PO Take 1 capsule by mouth daily.   butalbital-acetaminophen-caffeine (FIORICET) 50-325-40 MG tablet Take 1 tablet by mouth every 6 (six) hours as needed.   conjugated estrogens (PREMARIN) vaginal cream INSERT 1 APPLICATORFUL VAGINALLY DAILY   cyanocobalamin (VITAMIN B12) 1000 MCG/ML injection Inject 1 mL (1,000 mcg total) into the muscle every 30 (thirty) days.   cyclobenzaprine (FLEXERIL) 10 MG tablet TAKE 1 TABLET BY MOUTH TWICE DAILY AS NEEDED FOR MUSCLE SPASMS (Patient taking differently: Take 10 mg by mouth 2 (two) times daily.)   cycloSPORINE (RESTASIS) 0.05 % ophthalmic emulsion Place 1 drop into both eyes 2 (two) times daily as needed (dry eyes).   DULoxetine (CYMBALTA) 60 MG capsule Take 60 mg by mouth daily.   erythromycin (ERY-TAB) 250 MG EC tablet Take 1 tablet (250 mg total) by mouth 3 (three) times daily before meals. TAKE 1 TABLET BY MOUTH THREE TIMES DAILY BEFORE MEALS Strength: 250 mg   ezetimibe (ZETIA) 10 MG tablet TAKE 1 TABLET(10 MG) BY MOUTH DAILY   fluticasone (FLONASE) 50 MCG/ACT nasal spray Place 2 sprays into both nostrils daily as needed for allergies.   HYDROmorphone (DILAUDID) 4 MG tablet Take 4 mg by mouth 3 (  three) times daily.   levofloxacin (LEVAQUIN) 750 MG tablet Take 750 mg by mouth daily.   levothyroxine (SYNTHROID) 125 MCG tablet TAKE 1 TABLET(125 MCG) BY MOUTH DAILY BEFORE BREAKFAST   lubiprostone (AMITIZA) 24 MCG capsule TAKE 1 CAPSULE(24 MCG) BY MOUTH TWICE DAILY WITH A MEAL   magnesium oxide (MAG-OX) 400 (240 Mg) MG tablet TAKE 1 TABLET BY MOUTH FOUR TIMES DAILY   metoprolol succinate (TOPROL-XL) 25 MG 24 hr tablet Take 1 tablet (25 mg total) by mouth daily.   miconazole (MICOTIN) 200 MG vaginal suppository Place 1 suppository (200 mg total)  vaginally at bedtime.   Multiple Vitamins-Minerals (ICAPS AREDS 2 PO) Take 1 capsule by mouth every evening.   naloxone (NARCAN) nasal spray 4 mg/0.1 mL SMARTSIG:Both Nares   omeprazole (PRILOSEC) 40 MG capsule Take 1 capsule (40 mg total) by mouth in the morning and at bedtime.   phenazopyridine (PYRIDIUM) 95 MG tablet Take by mouth.   polyethylene glycol (MIRALAX / GLYCOLAX) 17 g packet Take by mouth.   potassium chloride (KLOR-CON M) 10 MEQ tablet TAKE 1 TABLET(10 MEQ) BY MOUTH DAILY   pravastatin (PRAVACHOL) 20 MG tablet TAKE 1 TABLET(20 MG) BY MOUTH DAILY   sennosides-docusate sodium (SENOKOT-S) 8.6-50 MG tablet as needed.   XARELTO 20 MG TABS tablet TAKE 1 TABLET(20 MG) BY MOUTH DAILY     Review of Systems      All other systems reviewed and are otherwise negative except as noted above.  Physical Exam    VS:  BP 96/70   Pulse 83   Ht  (1.626 m)   Wt 190 lb (86.2 kg)   BMI 32.61 kg/m  , BMI Body mass index is 32.61 kg/m.  Wt Readings from Last 3 Encounters:  04/16/23 190 lb (86.2 kg)  12/02/22 180 lb 1.3 oz (81.7 kg)  09/14/22 179 lb (81.2 kg)     GEN: Well nourished, well developed, in no acute distress. HEENT: normal. Neck: Supple, no JVD, carotid bruits, or masses. Cardiac: RRR, no murmurs, rubs, or gallops. No clubbing, cyanosis, edema.  Radials/PT/DP 2+ and equal bilaterally.  Respiratory: Chest pain-respirations regular and unlabored, clear to auscultation bilaterally. GI: Soft, nontender, nondistended. MS: No deformity or atrophy. Skin: Warm and dry, no rash. Neuro:  Strength and sensation are intact. Psych: Normal affect.  Assessment & Plan    Chest pain -  Cardiac CTA 2020 and repeat 02/2022 with coronary calcium score of 0.  Reassurance provided.  No indication for ischemic evaluation. Heart healthy diet and regular cardiovascular exercise encouraged.    Palpitations -Notes worsening palpitations associated with chest pain, dyspnea. ZIO placed in  clinic. Echo to rule out structural abnormalities. Recommend avoid caffeine, alcohol, stay well hydrated, manage stress well. Continue Toprol  QD. May take additional 12.5mg  PRN for breakthrough palpitations.  HLD - Continue Pravastatin, Zetia. Denies myalgias. Continue to follow with PCP.   Hx of DVT / Hypercoagulable state - On Xarelto. No bleeding complications. Continue to follow with PCP.          Disposition: Follow up in 6 week(s) with Christell Constant, MD or APP.  Signed, Alver Sorrow, NP 04/16/2023, 10:41 AM [facility]

## 2023-04-16 NOTE — Patient Instructions (Signed)
Medication Instructions:  Your physician has recommended you make the following change in your medication:   Metoprolol Succinate daily   Metoprolol Tartrate 1/2 tablet as needed for breakthrough palpitations   Testing/Procedures: Your physician has requested that you have an echocardiogram. Echocardiography is a painless test that uses sound waves to create images of your heart. It provides your doctor with information about the size and shape of your heart and how well your heart's chambers and valves are working. This procedure takes approximately one hour. There are no restrictions for this procedure. Please do NOT wear cologne, perfume, aftershave, or lotions (deodorant is allowed). Please arrive 15 minutes prior to your appointment time.  Your physician has recommended that you wear a Zio monitor.   This monitor is a medical device that records the heart's electrical activity. Doctors most often use these monitors to diagnose arrhythmias. Arrhythmias are problems with the speed or rhythm of the heartbeat. The monitor is a small device applied to your chest. You can wear one while you do your normal daily activities. While wearing this monitor if you have any symptoms to push the button and record what you felt. Once you have worn this monitor for the period of time provider prescribed (Usually 14 days), you will return the monitor device in the postage paid box. Once it is returned they will download the data collected and provide [place] with a report which the provider will then review and we will call you with those results. Important tips:  Avoid showering during the first 24 hours of wearing the monitor. Avoid excessive sweating to help maximize wear time. Do not submerge the device, no hot tubs, and no swimming pools. Keep any lotions or oils away from the patch. After 24 hours you may shower with the patch on. Take brief showers with your back facing the shower head.  Do not remove  patch once it has been placed because that will interrupt data and decrease adhesive wear time. Push the button when you have any symptoms and write down what you were feeling. Once you have completed wearing your monitor, remove and place into box which has postage paid and place in your outgoing mailbox.  If for some reason you have misplaced your box then call our office and we can provide another box and/or mail it off for you.  Follow-Up: At [facility], you and your health needs are our priority.  As part of our continuing mission to provide you with exceptional heart care, we have created designated Provider Care Teams.  These Care Teams include your primary Cardiologist (physician) and Advanced Practice Providers (APPs -  Physician Assistants and Nurse Practitioners) who all work together to provide you with the care you need, when you need it.  We recommend signing up for the patient portal called "MyChart".  Sign up information is provided on this After Visit Summary.  MyChart is used to connect with patients for Virtual Visits (Telemedicine).  Patients are able to view lab/test results, encounter notes, upcoming appointments, etc.  Non-urgent messages can be sent to your provider as well.   To learn more about what you can do with MyChart, go to [email or web address].    Your next appointment:   6 weeks with Gillian Shields, NP or Sharlene Dory, NP

## 2023-04-21 ENCOUNTER — Encounter (HOSPITAL_BASED_OUTPATIENT_CLINIC_OR_DEPARTMENT_OTHER): Payer: Self-pay | Admitting: Family

## 2023-04-22 ENCOUNTER — Ambulatory Visit (INDEPENDENT_AMBULATORY_CARE_PROVIDER_SITE_OTHER): Payer: Medicare Other | Admitting: Gastroenterology

## 2023-04-22 ENCOUNTER — Inpatient Hospital Stay (HOSPITAL_COMMUNITY): Payer: Medicare Other

## 2023-04-22 ENCOUNTER — Emergency Department (HOSPITAL_COMMUNITY): Payer: Medicare Other

## 2023-04-22 ENCOUNTER — Inpatient Hospital Stay (HOSPITAL_COMMUNITY)
Admission: EM | Admit: 2023-04-22 | Discharge: 2023-04-24 | DRG: 389 | Disposition: A | Discharge status: Home / Self Care | Payer: Medicare Other | Attending: Family Medicine | Admitting: Family Medicine

## 2023-04-22 ENCOUNTER — Encounter (HOSPITAL_COMMUNITY): Payer: Self-pay | Admitting: Emergency Medicine

## 2023-04-22 ENCOUNTER — Other Ambulatory Visit: Payer: Self-pay

## 2023-04-22 DIAGNOSIS — F325 Major depressive disorder, single episode, in full remission: Secondary | ICD-10-CM | POA: Diagnosis not present

## 2023-04-22 DIAGNOSIS — E039 Hypothyroidism, unspecified: Secondary | ICD-10-CM | POA: Diagnosis not present

## 2023-04-22 DIAGNOSIS — E559 Vitamin D deficiency, unspecified: Secondary | ICD-10-CM | POA: Diagnosis present

## 2023-04-22 DIAGNOSIS — M797 Fibromyalgia: Secondary | ICD-10-CM | POA: Diagnosis present

## 2023-04-22 DIAGNOSIS — E669 Obesity, unspecified: Secondary | ICD-10-CM | POA: Diagnosis not present

## 2023-04-22 DIAGNOSIS — N3010 Interstitial cystitis (chronic) without hematuria: Secondary | ICD-10-CM | POA: Diagnosis present

## 2023-04-22 DIAGNOSIS — Z8719 Personal history of other diseases of the digestive system: Secondary | ICD-10-CM

## 2023-04-22 DIAGNOSIS — Z9049 Acquired absence of other specified parts of digestive tract: Secondary | ICD-10-CM

## 2023-04-22 DIAGNOSIS — E860 Dehydration: Secondary | ICD-10-CM | POA: Diagnosis not present

## 2023-04-22 DIAGNOSIS — J329 Chronic sinusitis, unspecified: Secondary | ICD-10-CM | POA: Diagnosis present

## 2023-04-22 DIAGNOSIS — K3184 Gastroparesis: Secondary | ICD-10-CM | POA: Diagnosis present

## 2023-04-22 DIAGNOSIS — K56600 Partial intestinal obstruction, unspecified as to cause: Principal | ICD-10-CM | POA: Diagnosis present

## 2023-04-22 DIAGNOSIS — Z6832 Body mass index (BMI) 32.0-32.9, adult: Secondary | ICD-10-CM | POA: Diagnosis not present

## 2023-04-22 DIAGNOSIS — Z9012 Acquired absence of left breast and nipple: Secondary | ICD-10-CM

## 2023-04-22 DIAGNOSIS — E785 Hyperlipidemia, unspecified: Secondary | ICD-10-CM | POA: Diagnosis not present

## 2023-04-22 DIAGNOSIS — Z90710 Acquired absence of both cervix and uterus: Secondary | ICD-10-CM

## 2023-04-22 DIAGNOSIS — F411 Generalized anxiety disorder: Secondary | ICD-10-CM | POA: Diagnosis not present

## 2023-04-22 DIAGNOSIS — Z800 Family history of malignant neoplasm of digestive organs: Secondary | ICD-10-CM

## 2023-04-22 DIAGNOSIS — K6389 Other specified diseases of intestine: Secondary | ICD-10-CM | POA: Diagnosis not present

## 2023-04-22 DIAGNOSIS — G894 Chronic pain syndrome: Secondary | ICD-10-CM | POA: Diagnosis not present

## 2023-04-22 DIAGNOSIS — I4719 Other supraventricular tachycardia: Secondary | ICD-10-CM | POA: Diagnosis present

## 2023-04-22 DIAGNOSIS — Z9109 Other allergy status, other than to drugs and biological substances: Secondary | ICD-10-CM

## 2023-04-22 DIAGNOSIS — Z8249 Family history of ischemic heart disease and other diseases of the circulatory system: Secondary | ICD-10-CM

## 2023-04-22 DIAGNOSIS — R109 Unspecified abdominal pain: Secondary | ICD-10-CM | POA: Diagnosis present

## 2023-04-22 DIAGNOSIS — Z885 Allergy status to narcotic agent status: Secondary | ICD-10-CM

## 2023-04-22 DIAGNOSIS — Z803 Family history of malignant neoplasm of breast: Secondary | ICD-10-CM

## 2023-04-22 DIAGNOSIS — Z86711 Personal history of pulmonary embolism: Secondary | ICD-10-CM

## 2023-04-22 DIAGNOSIS — Z79891 Long term (current) use of opiate analgesic: Secondary | ICD-10-CM

## 2023-04-22 DIAGNOSIS — Z4659 Encounter for fitting and adjustment of other gastrointestinal appliance and device: Secondary | ICD-10-CM | POA: Diagnosis not present

## 2023-04-22 DIAGNOSIS — I482 Chronic atrial fibrillation, unspecified: Secondary | ICD-10-CM | POA: Insufficient documentation

## 2023-04-22 DIAGNOSIS — Z888 Allergy status to other drugs, medicaments and biological substances status: Secondary | ICD-10-CM

## 2023-04-22 DIAGNOSIS — Z9181 History of falling: Secondary | ICD-10-CM

## 2023-04-22 DIAGNOSIS — K219 Gastro-esophageal reflux disease without esophagitis: Secondary | ICD-10-CM | POA: Diagnosis not present

## 2023-04-22 DIAGNOSIS — J9811 Atelectasis: Secondary | ICD-10-CM | POA: Diagnosis not present

## 2023-04-22 DIAGNOSIS — G4489 Other headache syndrome: Secondary | ICD-10-CM | POA: Diagnosis present

## 2023-04-22 DIAGNOSIS — Z8673 Personal history of transient ischemic attack (TIA), and cerebral infarction without residual deficits: Secondary | ICD-10-CM

## 2023-04-22 DIAGNOSIS — K56609 Unspecified intestinal obstruction, unspecified as to partial versus complete obstruction: Secondary | ICD-10-CM | POA: Diagnosis not present

## 2023-04-22 DIAGNOSIS — Z853 Personal history of malignant neoplasm of breast: Secondary | ICD-10-CM | POA: Diagnosis not present

## 2023-04-22 DIAGNOSIS — M1990 Unspecified osteoarthritis, unspecified site: Secondary | ICD-10-CM | POA: Diagnosis present

## 2023-04-22 DIAGNOSIS — G43909 Migraine, unspecified, not intractable, without status migrainosus: Secondary | ICD-10-CM | POA: Diagnosis present

## 2023-04-22 DIAGNOSIS — Z8614 Personal history of Methicillin resistant Staphylococcus aureus infection: Secondary | ICD-10-CM

## 2023-04-22 DIAGNOSIS — M542 Cervicalgia: Secondary | ICD-10-CM | POA: Diagnosis not present

## 2023-04-22 DIAGNOSIS — Z83438 Family history of other disorder of lipoprotein metabolism and other lipidemia: Secondary | ICD-10-CM | POA: Diagnosis not present

## 2023-04-22 DIAGNOSIS — Z7982 Long term (current) use of aspirin: Secondary | ICD-10-CM

## 2023-04-22 DIAGNOSIS — Z7901 Long term (current) use of anticoagulants: Secondary | ICD-10-CM | POA: Diagnosis not present

## 2023-04-22 DIAGNOSIS — Z8619 Personal history of other infectious and parasitic diseases: Secondary | ICD-10-CM

## 2023-04-22 DIAGNOSIS — Z79890 Hormone replacement therapy (postmenopausal): Secondary | ICD-10-CM

## 2023-04-22 DIAGNOSIS — Z79899 Other long term (current) drug therapy: Secondary | ICD-10-CM

## 2023-04-22 LAB — COMPREHENSIVE METABOLIC PANEL
ALT: 19 U/L (ref 0–44)
AST: 21 U/L (ref 15–41)
Albumin: 4 g/dL (ref 3.5–5.0)
Alkaline Phosphatase: 97 U/L (ref 38–126)
Anion gap: 11 (ref 5–15)
BUN: 10 mg/dL (ref 8–23)
CO2: 24 mmol/L (ref 22–32)
Calcium: 9.4 mg/dL (ref 8.9–10.3)
Chloride: 102 mmol/L (ref 98–111)
Creatinine, Ser: 0.8 mg/dL (ref 0.44–1.00)
GFR, Estimated: >60 mL/min (ref >60)
Glucose, Bld: 121 mg/dL — ABNORMAL HIGH (ref 70–99)
Potassium: 4.1 mmol/L (ref 3.5–5.1)
Sodium: 137 mmol/L (ref 135–145)
Total Bilirubin: 0.6 mg/dL (ref 0.3–1.2)
Total Protein: 6.7 g/dL (ref 6.5–8.1)

## 2023-04-22 LAB — CBC
HCT: 45.8 % (ref 36.0–46.0)
Hemoglobin: 15.3 g/dL — ABNORMAL HIGH (ref 12.0–15.0)
MCH: 30.1 pg (ref 26.0–34.0)
MCHC: 33.4 g/dL (ref 30.0–36.0)
MCV: 90.2 fL (ref 80.0–100.0)
Platelets: 232 10*3/uL (ref 150–400)
RBC: 5.08 MIL/uL (ref 3.87–5.11)
RDW: 13.5 % (ref 11.5–15.5)
WBC: 8.6 10*3/uL (ref 4.0–10.5)
nRBC: 0 % (ref 0.0–0.2)

## 2023-04-22 LAB — URINALYSIS, ROUTINE W REFLEX MICROSCOPIC
Bilirubin Urine: NEGATIVE
Glucose, UA: NEGATIVE mg/dL
Hgb urine dipstick: NEGATIVE
Ketones, ur: NEGATIVE mg/dL
Leukocytes,Ua: NEGATIVE
Nitrite: NEGATIVE
Protein, ur: NEGATIVE mg/dL
Specific Gravity, Urine: 1.011 (ref 1.005–1.030)
pH: 6 (ref 5.0–8.0)

## 2023-04-22 LAB — LACTIC ACID, PLASMA
Lactic Acid, Venous: 1.4 mmol/L (ref 0.5–1.9)
Lactic Acid, Venous: 1.2 mmol/L (ref 0.5–1.9)

## 2023-04-22 LAB — LIPASE, BLOOD: Lipase: 40 U/L (ref 11–51)

## 2023-04-22 MED ORDER — IOHEXOL 300 MG/ML  SOLN
100.0000 mL | Freq: Once | INTRAMUSCULAR | Status: AC | PRN
  Administered 2023-04-22: 100 mL via INTRAVENOUS

## 2023-04-22 MED ORDER — HYDROMORPHONE HCL 1 MG/ML IJ SOLN
2.0000 mg | Freq: Once | INTRAMUSCULAR | Status: AC
  Administered 2023-04-22: 2 mg via INTRAVENOUS
  Filled 2023-04-22: qty 2

## 2023-04-22 MED ORDER — ONDANSETRON HCL 4 MG/2ML IJ SOLN
4.0000 mg | Freq: Once | INTRAMUSCULAR | Status: AC
  Administered 2023-04-22: 4 mg via INTRAVENOUS
  Filled 2023-04-22: qty 2

## 2023-04-22 MED ORDER — ONDANSETRON HCL 4 MG/2ML IJ SOLN
4.0000 mg | Freq: Four times a day (QID) | INTRAMUSCULAR | Status: DC | PRN
  Administered 2023-04-22 – 2023-04-24 (×2): 4 mg via INTRAVENOUS
  Filled 2023-04-22 (×2): qty 2

## 2023-04-22 MED ORDER — ACETAMINOPHEN 325 MG PO TABS
650.0000 mg | ORAL_TABLET | Freq: Four times a day (QID) | ORAL | Status: DC | PRN
  Administered 2023-04-24: 650 mg via ORAL
  Filled 2023-04-22 (×2): qty 2

## 2023-04-22 MED ORDER — HEPARIN SODIUM (PORCINE) 5000 UNIT/ML IJ SOLN
5000.0000 [IU] | Freq: Three times a day (TID) | INTRAMUSCULAR | Status: DC
  Administered 2023-04-22: 5000 [IU] via SUBCUTANEOUS
  Filled 2023-04-22: qty 1

## 2023-04-22 MED ORDER — FENTANYL CITRATE (PF) 100 MCG/2ML IJ SOLN
100.0000 ug | Freq: Once | INTRAMUSCULAR | Status: AC
  Administered 2023-04-22: 100 ug via INTRAVENOUS
  Filled 2023-04-22: qty 2

## 2023-04-22 MED ORDER — ACETAMINOPHEN 650 MG RE SUPP
650.0000 mg | Freq: Four times a day (QID) | RECTAL | Status: DC | PRN
  Administered 2023-04-23: 650 mg via RECTAL
  Filled 2023-04-22: qty 1

## 2023-04-22 MED ORDER — ONDANSETRON HCL 4 MG PO TABS: 4.0000 mg | ORAL_TABLET | Freq: Four times a day (QID) | ORAL | Status: DC | PRN

## 2023-04-22 MED ORDER — DIPHENHYDRAMINE HCL 50 MG/ML IJ SOLN
25.0000 mg | Freq: Three times a day (TID) | INTRAMUSCULAR | Status: DC | PRN
  Administered 2023-04-23: 25 mg via INTRAVENOUS
  Filled 2023-04-22: qty 1

## 2023-04-22 MED ORDER — MORPHINE SULFATE (PF) 2 MG/ML IV SOLN
2.0000 mg | INTRAVENOUS | Status: DC | PRN
  Administered 2023-04-22 – 2023-04-23 (×4): 2 mg via INTRAVENOUS
  Filled 2023-04-22 (×4): qty 1

## 2023-04-22 MED ORDER — SODIUM CHLORIDE 0.9 % IV SOLN: INTRAVENOUS | Status: DC

## 2023-04-22 NOTE — ED Notes (Signed)
ED TO INPATIENT HANDOFF REPORT  ED Nurse Name and Phone #: Lanora Manis [phone]  S Name/Age/Gender [name] 62 y.o. female Room/Bed: APA03/APA03  Code Status   Code Status: Full Code  Home/SNF/Other Home Patient oriented to: self, place, time, and situation Is this baseline? Yes   Triage Complete: Triage complete  Chief Complaint SBO (small bowel obstruction) (HCC) [K56.609]  Triage Note Pt complains of left sided abd pain radiates to right. Nausea without vomiting. Extensive history of bowel obstructions and feels that is the issue today. Pt states pain feels like a contraction.    Allergies Allergies  Allergen Reactions   Nitrofurantoin Hives   Crestor [Rosuvastatin Calcium] Other (See Comments)    Generalized cramps   Bisacodyl Other (See Comments)    Makes patient feel like she is having cramps    Codeine Itching   Iron Sucrose Other (See Comments)    Flushing- required benadryl and solu medrol   Monosodium Glutamate Other (See Comments)    Cluster migraines    Scopolamine Hbr Other (See Comments)    Cluster migraines, impaired vision   Other Hives, Itching, Swelling, Rash and Other (See Comments)    Cigarette smoke    Level of Care/Admitting Diagnosis ED Disposition     ED Disposition  Admit   Condition  --   Comment  Hospital Area: [facility] [[id number]] Level of Care: Med-Surg [16] Covid Evaluation: Asymptomatic - no recent exposure (last 10 days) testing not required Diagnosis: SBO (small bowel obstruction) [facility]) [[id number]] Admitting Physician: Shirline Frees, ASIA B [[id number]] Attending Physician: Lilyan Gilford [id number] Certification:: I certify this patient will need inpatient services for at least 2 midnights Estimated Length of Stay: 2          B Medical/Surgery History Past Medical History:  Diagnosis Date   Abdominal hernia 08/14/2019   Abdominal pain 06/06/2019   Acute bronchitis 05/01/2013   Acute cystitis  06/09/2010   Qualifier: Diagnosis of  By: Lillia Mountain LPN, Brandi     Acute renal insufficiency 08/28/2011   Allergy    Phreesia 03/16/2021   Anemia    Phreesia 10/29/2020   Anxiety    Anxiety state 03/16/2008   Qualifier: Diagnosis of  By: Lind Guest     Arthritis    Phreesia 10/29/2020   Back pain with radiation 07/17/2014   Bilateral hand pain 07/27/2016   Blood transfusion without reported diagnosis    Phreesia 10/29/2020   Breast cancer (HCC)    L breast- 2006   Cancer (HCC)    Phreesia 10/29/2020   Carpal tunnel syndrome    Chronic constipation    Chronic pain syndrome    followed by [facility]---  back   Clotting disorder (HCC)    Phreesia 10/29/2020   Depression    Dermatitis 12/15/2011   Difficult intubation    Educated about COVID-19 virus infection 05/14/2019   Fall at home 01/26/2016   Family history of adverse reaction to anesthesia    MOTHER--- PONV   Fatigue 09/17/2012   Fibromyalgia    FIBROMYALGIA 03/16/2008   Qualifier: Diagnosis of  By: Lind Guest     GERD (gastroesophageal reflux disease)    Headache disorder 01/16/2013   Headache syndrome 01/26/2016   Hip pain, right 07/08/2019   History of MRSA infection    lip abscess   History of ovarian cyst 06/2011   s/p  BSO   History of pulmonary embolus (PE) 1997   post EP with ablation  pulmonary veouns for SVT/ Atrial Fib.   History of supraventricular tachycardia    s/p  ablation 1996  and 1997  by dr Graciela Husbands   History of TIA (transient ischemic attack) 1997   post op EP ablation PE   Hyperlipidemia    Hyperlipidemia LDL goal <100 03/16/2008   Qualifier: Diagnosis of  By: Lind Guest     Hypothyroidism    followed by pcp   Incisional hernia    Insomnia 12/15/2011   Intermittent palpitations 08/22/2017   Interstitial cystitis    09-13-2018   per pt last flare-up  May 2019 (followed by pcp)   INTERSTITIAL CYSTITIS 02/17/2011   Qualifier: Diagnosis of  By: Lodema Hong MD, Margaret     Iron deficiency  anemia    09-13-2018  PER PT STABLE   Irregular heart rate 07/25/2013   Lipoma of back    upper   Low back pain radiating to right leg 07/04/2019   Low ferritin level 06/14/2014   MDD (major depressive disorder), single episode, in full remission (HCC) 10/27/2017   Medically noncompliant 02/28/2015   Multiple missed appointments, both follow-up appointments and lab appointments.    Metabolic syndrome X 02/10/2011   Qualifier: Diagnosis of  By: Lodema Hong MD, Margaret  hBA1c is 5.8 in 02/2013    Migraines    Morbid obesity (HCC) 03/27/2013   Muscle spasm 01/03/2020   Nausea alone 07/17/2014   NECK PAIN, CHRONIC 03/16/2008   Qualifier: Diagnosis of  By: Lind Guest     Normal coronary arteries    a. by CT 12/2018.   Obesity 03/16/2008   Qualifier: Diagnosis of  By: Lind Guest     Oral ulceration 08/23/2014   Presented at 06/14/2014 visit    Other malaise and fatigue 03/16/2008   Centricity Description: FATIGUE, CHRONIC Qualifier: Diagnosis of  By: Lind Guest   Centricity Description: FATIGUE Qualifier: Diagnosis of  By: Garnette Czech PA, Dawn     OVARIAN CYST 12/26/2009   Qualifier: Diagnosis of  By: Lodema Hong MD, Margaret     Paget disease of breast, left [facility])    Paget's disease of breast, left (HCC) 03/16/2008   Qualifier: Diagnosis of  By: Lind Guest  Left diagnosed in 2006 F/h breast cancer x 15 family members   Partial small bowel obstruction (HCC) 07/04/2012   PONV (postoperative nausea and vomiting)    SEVERE   Postsurgical menopause 11/13/2011   Presence of IVC filter 06/06/2019   PSVT (paroxysmal supraventricular tachycardia)    HX ABLATION 1996 AND 1997   Recurrent oral herpes simplex infection 10/13/2017   ROM (right otitis media) 01/23/2013   S/P insertion of IVC (inferior vena caval) filter 05/08/2005   greenfield (non-retrievable)  /  dx 2019  a leg of filter is protruding thru the vena cava in to right L2 vertebral body (09-13-2018  per pt having surgery to remove filter in  [place])   S/P radiofrequency ablation operation for arrhythmia 1996   1996  and 1997,   SVT and Atrial Fib   Sinusitis, chronic 01/26/2016   SMALL BOWEL OBSTRUCTION, HX OF 08/07/2008   Annotation: obstruction w/ adhesions led to partial colectomy Qualifier: Diagnosis of  By: Minna Merritts     Stroke [facility])    Phreesia 10/29/2020, TIAs in the past   Swelling of hand 09/17/2012   Syncope    Tachycardia 02/03/2010   Qualifier: Diagnosis of  By: Via LPN, Larita Fife     Thyroid disease    Phreesia 10/29/2020  Ulcer 12/15/2011   Urinary frequency 07/18/2014   Vaginitis and vulvovaginitis 05/10/2013   Vitamin D deficiency 05/05/2015   Wears glasses    Past Surgical History:  Procedure Laterality Date   ABDOMINAL HYSTERECTOMY  1987   ANTERIOR CERVICAL DECOMP/DISCECTOMY FUSION  03-07-2002   dr elsner  @[facility]    C 4 -- 5   APPENDECTOMY  1980   AUGMENTATION MAMMAPLASTY Right 2006   BILATERAL SALPINGOOPHORECTOMY  07/24/2011   via Explor. Lap. w/ intraoperative perf. bowel repair   BIOPSY  05/02/2021   Procedure: BIOPSY;  Surgeon: Marguerita Merles, Reuel Boom, MD;  [facility];  Service: Gastroenterology;;  small bowel, mid esophagus, distal esophagus, random colon biopsies   BIOPSY  12/04/2021   Procedure: BIOPSY;  Surgeon: Lanelle Bal, DO;  [facility];  Service: Endoscopy;;   BIOPSY  03/31/2022   Procedure: BIOPSY;  Surgeon: Dolores Frame, MD;  [facility];  Service: Gastroenterology;;   BOTOX INJECTION N/A 03/31/2022   Procedure: BOTOX INJECTION;  Surgeon: Dolores Frame, MD;  [facility];  Service: Gastroenterology;  Laterality: N/A;   BREAST BIOPSY Right 2019   benign   BREAST ENHANCEMENT SURGERY Bilateral 1993   BREAST IMPLANT REMOVAL Bilateral    BREAST SURGERY N/A    Phreesia 10/29/2020   CARDIAC CATHETERIZATION  07-06-2003   dr Smitty Cords brodie   normal coronaries and LVF   CARDIAC ELECTROPHYSIOLOGY STUDY AND ABLATION  1996  and  1997   CARDIOVASCULAR STRESS TEST  11-18-2015   dr Graciela Husbands   normal nuclear study w/ no ischemia/  normal LV function and wall motion , ef 84%   CARPAL TUNNEL RELEASE Right ?   CESAREAN SECTION  1986   CESAREAN SECTION N/A    Phreesia 10/29/2020   CHOLECYSTECTOMY N/A    Phreesia 10/29/2020   COLON SURGERY     COLONOSCOPY WITH PROPOFOL N/A 05/02/2021   Procedure: COLONOSCOPY WITH PROPOFOL;  Surgeon: Dolores Frame, MD;  [facility];  Service: Gastroenterology;  Laterality: N/A;  12:30 PM   CYSTO/  HYDRODISTENTION/  INSTILATION THERAPY  MULTIPLE   ENTEROCUTANEOUS FISTULA CLOSURE  multiple   last one 2015 with small bowel resection   ESOPHAGOGASTRODUODENOSCOPY (EGD) WITH PROPOFOL N/A 05/02/2021   Procedure: ESOPHAGOGASTRODUODENOSCOPY (EGD) WITH PROPOFOL;  Surgeon: Dolores Frame, MD;  [facility];  Service: Gastroenterology;  Laterality: N/A;   ESOPHAGOGASTRODUODENOSCOPY (EGD) WITH PROPOFOL N/A 12/04/2021   Procedure: ESOPHAGOGASTRODUODENOSCOPY (EGD) WITH PROPOFOL;  Surgeon: Lanelle Bal, DO;  [facility];  Service: Endoscopy;  Laterality: N/A;   ESOPHAGOGASTRODUODENOSCOPY (EGD) WITH PROPOFOL N/A 03/31/2022   Procedure: ESOPHAGOGASTRODUODENOSCOPY (EGD) WITH PROPOFOL;  Surgeon: Dolores Frame, MD;  [facility];  Service: Gastroenterology;  Laterality: N/A;  1015   EXPLORATORY LAPAROTOMY INCISIONAL VENTRAL HERNIA REPAIR / RESECTION SMALL BOWEL  11-09-2014   @[facility]    FRACTURE SURGERY N/A    Phreesia 10/29/2020   JOINT REPLACEMENT N/A    Phreesia 10/29/2020   LIPOMA EXCISION Right 09/16/2018   Procedure: EXCISION LIPOMA UPPER BACK;  Surgeon: Berna Bue, MD;  Location: [facility];  Service: General;  Laterality: Right;   MASTECTOMY Left 2006   w/ reconstruction on left (paget's disease)  and right breast augmentation   POLYPECTOMY  05/02/2021   Procedure: POLYPECTOMY;  Surgeon: Dolores Frame, MD;  [facility];  Service: Gastroenterology;;  gastric   POLYPECTOMY  03/31/2022   Procedure:  POLYPECTOMY;  Surgeon: Marguerita Merles, Reuel Boom, MD;  [facility];  Service: Gastroenterology;;   Gaspar Bidding DILATION  05/02/2021   Procedure: Gaspar Bidding DILATION;  Surgeon: Marguerita Merles, Reuel Boom, MD;  [facility];  Service: Gastroenterology;;   SMALL INTESTINE SURGERY N/A    Phreesia 10/29/2020   SPINE SURGERY N/A    Phreesia 10/29/2020   TOTAL COLECTOMY  08-04-2002       AND CHOLECYSTECTOMY  (colonic inertia)   TRANSTHORACIC ECHOCARDIOGRAM  02/04/2016   ef 60-65%,  grade 2 diastolic dysfunction/  mild MR   TUBAL LIGATION N/A    Phreesia 03/16/2021   VENA CAVA FILTER PLACEMENT  05/08/2005      [facility] (non-retrievable)   WIDE EXCISION PERIRECTAL ABSCESSES  09-22-2005   @ [facility]     A IV Location/Drains/Wounds Patient Lines/Drains/Airways Status     Active Line/Drains/Airways     Name Placement date Placement time Site Days   Peripheral IV 04/22/23 20 G Anterior;Distal;Right Forearm 04/22/23  1936  Forearm  less than 1   NG/OG Vented/Dual Lumen 16 Fr. Left nare Marking at nare/corner of mouth 68 cm 04/22/23  2145  Left nare  less than 1   Colostomy Other (comment) --  --  Other (comment)  --   Incision (Closed) 09/16/18 Back Right 09/16/18  0932  -- 1679            Intake/Output Last 24 hours No intake or output data in the 24 hours ending 04/22/23 2147  Labs/Imaging Results for orders placed or performed during the hospital encounter of 04/22/23 (from the past 48 hour(s))  Lipase, blood     Status: None   Collection Time: 04/22/23  2:14 PM  Result Value Ref Range   Lipase 40 11 - 51 U/L    Comment: Performed at [facility], [address] [place], [place] [address]  Comprehensive metabolic panel     Status: Abnormal   Collection Time: 04/22/23  2:14 PM  Result Value Ref Range   Sodium 137 135 - 145 mmol/L   Potassium 4.1  3.5 - 5.1 mmol/L   Chloride 102 98 - 111 mmol/L   CO2 24 22 - 32 mmol/L   Glucose, Bld 121 (H) 70 - 99 mg/dL    Comment: Glucose reference range applies only to samples taken after fasting for at least 8 hours.   BUN 10 8 - 23 mg/dL   Creatinine, Ser 6.04 0.44 - 1.00 mg/dL   Calcium 9.4 8.9 - 54.0 mg/dL   Total Protein 6.7 6.5 - 8.1 g/dL   Albumin 4.0 3.5 - 5.0 g/dL   AST 21 15 - 41 U/L   ALT 19 0 - 44 U/L   Alkaline Phosphatase 97 38 - 126 U/L   Total Bilirubin 0.6 0.3 - 1.2 mg/dL   GFR, Estimated >98 >11 mL/min    Comment: (NOTE) Calculated using the CKD-EPI Creatinine Equation (2021)    Anion gap 11 5 - 15    Comment: Performed at [facility], [address], [place], [place] [address]  CBC     Status: Abnormal   Collection Time: 04/22/23  2:14 PM  Result Value Ref Range   WBC 8.6 4.0 - 10.5 K/uL   RBC 5.08 3.87 - 5.11 MIL/uL   Hemoglobin 15.3 (H) 12.0 - 15.0 g/dL   HCT 29.5 62.1 - 30.8 %   MCV 90.2 80.0 - 100.0 fL   MCH 30.1 26.0 - 34.0 pg   MCHC 33.4 30.0 -  36.0 g/dL   RDW 69.6 29.5 - 28.4 %   Platelets 232 150 - 400 K/uL   nRBC 0.0 0.0 - 0.2 %    Comment: Performed at [facility], [address], [place], [place] [address]  Lactic acid, plasma     Status: None   Collection Time: 04/22/23  2:14 PM  Result Value Ref Range   Lactic Acid, Venous 1.4 0.5 - 1.9 mmol/L    Comment: Performed at [facility], [address] [place], [place] [address]  Urinalysis, Routine w reflex microscopic -Urine, Clean Catch     Status: Abnormal   Collection Time: 04/22/23  3:41 PM  Result Value Ref Range   Color, Urine YELLOW YELLOW   APPearance HAZY (A) CLEAR   Specific Gravity, Urine 1.011 1.005 - 1.030   pH 6.0 5.0 - 8.0   Glucose, UA NEGATIVE NEGATIVE mg/dL   Hgb urine dipstick NEGATIVE NEGATIVE   Bilirubin Urine NEGATIVE NEGATIVE   Ketones, ur NEGATIVE NEGATIVE mg/dL   Protein, ur NEGATIVE NEGATIVE mg/dL   Nitrite NEGATIVE NEGATIVE   Leukocytes,Ua NEGATIVE NEGATIVE     Comment: Performed at [facility], [address], [place], [place] [address]  Lactic acid, plasma     Status: None   Collection Time: 04/22/23  3:51 PM  Result Value Ref Range   Lactic Acid, Venous 1.2 0.5 - 1.9 mmol/L    Comment: Performed at [facility], [address] [place], [place] [address]   *Note: Due to a large number of results and/or encounters for the requested time period, some results have not been displayed. A complete set of results can be found in Results Review.   CT ABDOMEN PELVIS WO CONTRAST  Result Date: 04/22/2023 CLINICAL DATA:  Acute nonlocalized abdominal pain. Left-sided abdominal pain radiating to the right. Nausea. Previous history of bowel obstructions. EXAM: CT ABDOMEN AND PELVIS WITHOUT CONTRAST TECHNIQUE: Multidetector CT imaging of the abdomen and pelvis was performed following the standard protocol without IV contrast. RADIATION DOSE REDUCTION: This exam was performed according to the departmental dose-optimization program which includes automated exposure control, adjustment of the mA and/or kV according to patient size and/or use of iterative reconstruction technique. COMPARISON:  06/03/2022 FINDINGS: Lower chest: Linear atelectasis in the lung bases. Right breast implant. Fatty infiltration in the region of the left breast tissue consistent with fat necrosis. This is possibly postoperative or posttraumatic. Similar appearance to previous study. Hepatobiliary: No focal liver abnormality is seen. Status post cholecystectomy. No biliary dilatation. Pancreas: Unremarkable. No pancreatic ductal dilatation or surrounding inflammatory changes. Spleen: Normal in size without focal abnormality. Adrenals/Urinary Tract: Adrenal glands are unremarkable. Kidneys are normal, without renal calculi, focal lesion, or hydronephrosis. Bladder is unremarkable. Stomach/Bowel: Stomach is distended with fluid and ingested material. Proximal small bowel are distended. Transition zone to  mostly decompressed distal small bowel. There appears to be partial right hemicolectomy with ileocolonic anastomosis. Additional surgical clips throughout the abdomen. Location of obstruction may be localized to an anterior anastomosis just deep to the anterior abdominal wall. Stool-filled colon. Vascular/Lymphatic: No significant vascular findings are present. No enlarged abdominal or pelvic lymph nodes. Reproductive: No pelvic mass. Other: No free air or free fluid in the abdomen. Scarring and deformity of the anterior abdominal wall is likely postoperative. Musculoskeletal: No acute or significant osseous findings. IMPRESSION: Changes consistent with small-bowel obstruction with transition zone likely in the anterior abdomen at the site of anastomosis. Partial colectomy with ileocolonic anastomosis and stool-filled colon. Electronically Signed  By: Burman Nieves M.D.   On: 04/22/2023 20:32    Pending Labs Unresulted Labs (From admission, onward)     Start     Ordered   04/23/23 0500  Comprehensive metabolic panel  Tomorrow morning,   R        04/22/23 2147   04/23/23 0500  Magnesium  Tomorrow morning,   R        04/22/23 2147   04/23/23 0500  CBC with Differential/Platelet  Tomorrow morning,   R        04/22/23 2147   04/22/23 2148  HIV Antibody (routine testing w rflx)  (HIV Antibody (Routine testing w reflex) panel)  Once,   R        04/22/23 2147            Vitals/Pain Today's Vitals   04/22/23 1800 04/22/23 1830 04/22/23 2106 04/22/23 2143  BP: 94/72 102/73 109/76   Pulse: 88 89 81   Resp:  16 17   Temp:  98.1 F (36.7 C) 98.1 F (36.7 C)   TempSrc:  Oral Oral   SpO2: 94% 95% 96%   Weight:      Height:      PainSc:    5     Isolation Precautions No active isolations  Medications Medications  heparin injection 5,000 Units (has no administration in time range)  acetaminophen (TYLENOL) tablet 650 mg (has no administration in time range)    Or  acetaminophen  (TYLENOL) suppository 650 mg (has no administration in time range)  morphine (PF) 2 MG/ML injection 2 mg (has no administration in time range)  ondansetron (ZOFRAN) tablet 4 mg (has no administration in time range)    Or  ondansetron (ZOFRAN) injection 4 mg (has no administration in time range)  diphenhydrAMINE (BENADRYL) injection 25 mg (has no administration in time range)  fentaNYL (SUBLIMAZE) injection 100 mcg (100 mcg Intravenous Given 04/22/23 1632)  ondansetron (ZOFRAN) injection 4 mg (4 mg Intravenous Given 04/22/23 1634)  HYDROmorphone (DILAUDID) injection 2 mg (2 mg Intravenous Given 04/22/23 1716)  iohexol (OMNIPAQUE) 300 MG/ML solution 100 mL (100 mLs Intravenous Contrast Given 04/22/23 1837)  HYDROmorphone (DILAUDID) injection 2 mg (2 mg Intravenous Given 04/22/23 2045)    Mobility walks     Focused Assessments Ng tube 16 in place verified, draining bright green/ yellow drainage. BS present x 3 hypo   R Recommendations: See Admitting Provider Note  Report given to:   Additional Notes: A &o x 4. Super nice!! Left arm restricted.

## 2023-04-22 NOTE — ED Triage Notes (Signed)
Pt complains of left sided abd pain radiates to right. Nausea without vomiting. Extensive history of bowel obstructions and feels that is the issue today. Pt states pain feels like a contraction.

## 2023-04-22 NOTE — ED Notes (Signed)
IV removed due to infiltration and patient did not get CT,  Patient has pain and swelling to area.

## 2023-04-22 NOTE — ED Provider Notes (Signed)
[facility] Provider Note   CSN: [id number] Arrival date & time: 04/22/23  1328     History  Chief Complaint  Patient presents with   Abdominal Pain    [name] is a 62 y.o. female.   Abdominal Pain    Patient has a history of reflux fibromyalgia, interstitial cystitis, chronic pain syndrome, pulmonary embolism, migraines, TIA, SVT, bowel obstruction, status post hysterectomy, oophorectomy, C-section, appendectomy, breast surgery who presents to the ED for evaluation of abdominal pain.  Patient states she started having symptoms last night.  Today the symptoms more intense and severe.  She is feeling nauseated but has not vomited.  Patient states this feels like when she has had bowel obstructions in the past.  Home Medications Prior to Admission medications   Medication Sig Start Date End Date Taking? Authorizing Provider  ALPRAZolam Prudy Feeler) 1 MG tablet Take one tablet by mouth two times daily for anxiety 02/10/23  Yes Kerri Perches, MD  aspirin 81 MG chewable tablet Chew 81 mg by mouth daily.   Yes [provider]  BIOTIN PO Take 1 capsule by mouth daily.   Yes [provider]  butalbital-acetaminophen-caffeine (FIORICET) 50-325-40 MG tablet Take 1 tablet by mouth every 6 (six) hours as needed. 04/11/22  Yes [provider]  Cholecalciferol (VITAMIN D3) 1.25 MG (50000 UT) CAPS Take 1 capsule by mouth every 30 (thirty) days.   Yes [provider]  conjugated estrogens (PREMARIN) vaginal cream INSERT 1 APPLICATORFUL VAGINALLY DAILY Patient taking differently: Place 1 applicator vaginally at bedtime. 12/11/22  Yes Kerri Perches, MD  cyanocobalamin (VITAMIN B12) 1000 MCG/ML injection Inject 1 mL (1,000 mcg total) into the muscle every 30 (thirty) days. 11/16/22  Yes Pennington, Rebekah M, PA-C  cyclobenzaprine (FLEXERIL) 10 MG tablet TAKE 1 TABLET BY MOUTH TWICE DAILY AS NEEDED FOR  MUSCLE SPASMS Patient taking differently: Take 10 mg by mouth 2 (two) times daily. 02/21/20  Yes Kerri Perches, MD  cycloSPORINE (RESTASIS) 0.05 % ophthalmic emulsion Place 1 drop into both eyes 2 (two) times daily as needed (dry eyes).   Yes [provider]  DULoxetine (CYMBALTA) 60 MG capsule Take 60 mg by mouth daily. 02/09/22  Yes [provider]  erythromycin (ERY-TAB) 250 MG EC tablet Take 1 tablet (250 mg total) by mouth 3 (three) times daily before meals. TAKE 1 TABLET BY MOUTH THREE TIMES DAILY BEFORE MEALS Strength: 250 mg 07/18/22  Yes Dolores Frame, MD  ezetimibe (ZETIA) 10 MG tablet TAKE 1 TABLET(10 MG) BY MOUTH DAILY Patient taking differently: Take 10 mg by mouth daily. 10/01/22  Yes Kerri Perches, MD  fluticasone (FLONASE) 50 MCG/ACT nasal spray Place 2 sprays into both nostrils daily as needed for allergies. 12/05/21  Yes Johnson, Clanford L, MD  HYDROmorphone (DILAUDID) 4 MG tablet Take 4 mg by mouth 3 (three) times daily.   Yes [provider]  levothyroxine (SYNTHROID) 125 MCG tablet TAKE 1 TABLET(125 MCG) BY MOUTH DAILY BEFORE BREAKFAST Patient taking differently: Take 125 mcg by mouth daily before breakfast. 12/04/22  Yes Kerri Perches, MD  lubiprostone (AMITIZA) 24 MCG capsule TAKE 1 CAPSULE(24 MCG) BY MOUTH TWICE DAILY WITH A MEAL Patient taking differently: Take 24 mcg by mouth 2 (two) times daily with a meal. 03/03/23  Yes Carlan, Chelsea L, NP  magnesium oxide (MAG-OX) 400 (240 Mg) MG tablet TAKE 1 TABLET BY MOUTH FOUR TIMES DAILY Patient taking differently: Take  400 mg by mouth 2 (two) times daily. 03/25/23  Yes Kerri Perches, MD  metoprolol succinate (TOPROL-XL) 25 MG 24 hr tablet Take 1 tablet (25 mg total) by mouth daily. 04/15/23  Yes Chandrasekhar, Mahesh A, MD  Multiple Vitamins-Minerals (ICAPS AREDS 2 PO) Take 1 capsule by mouth every evening.   Yes [provider]  omeprazole (PRILOSEC) 40 MG capsule  Take 1 capsule (40 mg total) by mouth in the morning and at bedtime. 11/18/21  Yes Erick Blinks, MD  OVER THE COUNTER MEDICATION Nutra-ful   Yes [provider]  phenazopyridine (PYRIDIUM) 95 MG tablet Take by mouth.   Yes [provider]  polyethylene glycol (MIRALAX / GLYCOLAX) 17 g packet Take by mouth.   Yes [provider]  potassium chloride (KLOR-CON M) 10 MEQ tablet TAKE 1 TABLET(10 MEQ) BY MOUTH DAILY Patient taking differently: Take 10 mEq by mouth every evening. 03/15/23  Yes Kerri Perches, MD  sennosides-docusate sodium (SENOKOT-S) 8.6-50 MG tablet as needed.   Yes [provider]  XARELTO 20 MG TABS tablet TAKE 1 TABLET(20 MG) BY MOUTH DAILY Patient taking differently: Take 20 mg by mouth in the morning. 03/24/23  Yes Kerri Perches, MD  levofloxacin (LEVAQUIN) 750 MG tablet Take 750 mg by mouth daily. Patient not taking: Reported on 04/22/2023 12/04/22   [provider]  naloxone [facility]) nasal spray 4 mg/0.1 mL SMARTSIG:Both Nares Patient not taking: Reported on 04/22/2023 01/15/23   [provider]  pravastatin (PRAVACHOL) 20 MG tablet TAKE 1 TABLET(20 MG) BY MOUTH DAILY Patient taking differently: Take 20 mg by mouth every evening. 11/04/22   Kerri Perches, MD      Allergies    Nitrofurantoin, Crestor [rosuvastatin calcium], Bisacodyl, Codeine, Iron sucrose, Monosodium glutamate, Scopolamine hbr, and Other    Review of Systems   Review of Systems  Gastrointestinal:  Positive for abdominal pain.    Physical Exam Updated Vital Signs BP 102/73   Pulse 89   Temp 98 F (36.7 C) (Oral)   Resp 18   Ht 1.626 m (5\' 4" )   Wt 86.2 kg   SpO2 95%   BMI 32.61 kg/m  Physical Exam Vitals and nursing note reviewed.  Constitutional:      Appearance: She is well-developed. She is ill-appearing.  HENT:     Head: Normocephalic and atraumatic.     Right Ear: External ear normal.     Left Ear: External ear  normal.  Eyes:     General: No scleral icterus.       Right eye: No discharge.        Left eye: No discharge.     Conjunctiva/sclera: Conjunctivae normal.  Neck:     Trachea: No tracheal deviation.  Cardiovascular:     Rate and Rhythm: Normal rate and regular rhythm.  Pulmonary:     Effort: Pulmonary effort is normal. No respiratory distress.     Breath sounds: Normal breath sounds. No stridor. No wheezing or rales.  Abdominal:     General: Bowel sounds are normal. There is no distension.     Palpations: Abdomen is soft.     Tenderness: There is generalized abdominal tenderness. There is no guarding or rebound.  Musculoskeletal:        General: No tenderness or deformity.     Cervical back: Neck supple.  Skin:    General: Skin is warm and dry.     Findings: No rash.  Neurological:  General: No focal deficit present.     Mental Status: She is alert.     Cranial Nerves: No cranial nerve deficit, dysarthria or facial asymmetry.     Sensory: No sensory deficit.     Motor: No abnormal muscle tone or seizure activity.     Coordination: Coordination normal.  Psychiatric:        Mood and Affect: Mood normal.     ED Results / Procedures / Treatments   Labs (all labs ordered are listed, but only abnormal results are displayed) Labs Reviewed  COMPREHENSIVE METABOLIC PANEL - Abnormal; Notable for the following components:      Result Value   Glucose, Bld 121 (*)    All other components within normal limits  CBC - Abnormal; Notable for the following components:   Hemoglobin 15.3 (*)    All other components within normal limits  URINALYSIS, ROUTINE W REFLEX MICROSCOPIC - Abnormal; Notable for the following components:   APPearance HAZY (*)    All other components within normal limits  LIPASE, BLOOD  LACTIC ACID, PLASMA  LACTIC ACID, PLASMA    EKG None  Radiology CT ABDOMEN PELVIS WO CONTRAST  Result Date: 04/22/2023 CLINICAL DATA:  Acute nonlocalized abdominal pain.  Left-sided abdominal pain radiating to the right. Nausea. Previous history of bowel obstructions. EXAM: CT ABDOMEN AND PELVIS WITHOUT CONTRAST TECHNIQUE: Multidetector CT imaging of the abdomen and pelvis was performed following the standard protocol without IV contrast. RADIATION DOSE REDUCTION: This exam was performed according to the departmental dose-optimization program which includes automated exposure control, adjustment of the mA and/or kV according to patient size and/or use of iterative reconstruction technique. COMPARISON:  06/03/2022 FINDINGS: Lower chest: Linear atelectasis in the lung bases. Right breast implant. Fatty infiltration in the region of the left breast tissue consistent with fat necrosis. This is possibly postoperative or posttraumatic. Similar appearance to previous study. Hepatobiliary: No focal liver abnormality is seen. Status post cholecystectomy. No biliary dilatation. Pancreas: Unremarkable. No pancreatic ductal dilatation or surrounding inflammatory changes. Spleen: Normal in size without focal abnormality. Adrenals/Urinary Tract: Adrenal glands are unremarkable. Kidneys are normal, without renal calculi, focal lesion, or hydronephrosis. Bladder is unremarkable. Stomach/Bowel: Stomach is distended with fluid and ingested material. Proximal small bowel are distended. Transition zone to mostly decompressed distal small bowel. There appears to be partial right hemicolectomy with ileocolonic anastomosis. Additional surgical clips throughout the abdomen. Location of obstruction may be localized to an anterior anastomosis just deep to the anterior abdominal wall. Stool-filled colon. Vascular/Lymphatic: No significant vascular findings are present. No enlarged abdominal or pelvic lymph nodes. Reproductive: No pelvic mass. Other: No free air or free fluid in the abdomen. Scarring and deformity of the anterior abdominal wall is likely postoperative. Musculoskeletal: No acute or significant  osseous findings. IMPRESSION: Changes consistent with small-bowel obstruction with transition zone likely in the anterior abdomen at the site of anastomosis. Partial colectomy with ileocolonic anastomosis and stool-filled colon. Electronically Signed   By: Burman Nieves M.D.   On: 04/22/2023 20:32    Procedures Procedures    Medications Ordered in ED Medications  fentaNYL (SUBLIMAZE) injection 100 mcg (100 mcg Intravenous Given 04/22/23 1632)  ondansetron (ZOFRAN) injection 4 mg (4 mg Intravenous Given 04/22/23 1634)  HYDROmorphone (DILAUDID) injection 2 mg (2 mg Intravenous Given 04/22/23 1716)  iohexol (OMNIPAQUE) 300 MG/ML solution 100 mL (100 mLs Intravenous Contrast Given 04/22/23 1837)  HYDROmorphone (DILAUDID) injection 2 mg (2 mg Intravenous Given 04/22/23 2045)    ED Course/  Medical Decision Making/ A&P Clinical Course as of 04/22/23 2111  Thu Apr 22, 2023  1635 CBC(!) nl [JK]  1635 Comprehensive metabolic panel(!) nl [JK]  1635 Lactic acid, plasma Reviewed case with Dr Sheliah Plane [JK]  1635 Lipase, blood nl [JK]  1707 Patient still having pain.  Patient states she takes 4 mg Dilaudid 3 times daily [JK]  2036 CT scan shows evidence of bowel obstruction.  Will place NG tube arrange for admission [JK]  2109 Reviewed case with [JK]    Clinical Course User Index [JK] Linwood Dibbles, MD                             Medical Decision Making Problems Addressed: SBO (small bowel obstruction) [facility]): acute illness or injury that poses a threat to life or bodily functions  Amount and/or Complexity of Data Reviewed Labs: ordered. Decision-making details documented in ED Course. Radiology: ordered and independent interpretation performed.  Risk Prescription drug management. Parenteral controlled substances. Drug therapy requiring intensive monitoring for toxicity. Decision regarding hospitalization.   Patient presented to the ED for evaluation of abdominal pain.  Patient has  had prior abdominal surgeries and bowel obstructions in the past.  Patient felt as if she was having a recurrent episode.  Labs did not show any evidence of hepatitis or pancreatitis.  Patient has CT scan of her abdomen pelvis that did show evidence of small bowel obstruction.  Likely related to her prior anastomosis.  NG tube has been ordered.  Consult with medical service for admission and further treatment        Final Clinical Impression(s) / ED Diagnoses Final diagnoses:  SBO (small bowel obstruction) (HCC)    Rx / DC Orders ED Discharge Orders     None         Linwood Dibbles, MD 04/22/23 2111

## 2023-04-22 NOTE — ED Provider Triage Note (Signed)
Emergency Medicine Provider Triage Evaluation Note  [name] , a 62 y.o. female  was evaluated in triage.  Pt complains of abdominal pain that feels similar to her prior bowel obstructions.  She also has history of gastroparesis.  Is nauseous but has not had an episode of emesis..  Review of Systems  Positive: As above Negative: As above  Physical Exam  BP (!) 139/104   Pulse 95   Temp 98.1 F (36.7 C) (Oral)   Resp 18   Ht  (1.626 m)   Wt 86.2 kg   SpO2 96%   BMI 32.61 kg/m  Gen:   Awake, no distress   Resp:  Normal effort  MSK:   Moves extremities without difficulty  Other:    Medical Decision Making  Medically screening exam initiated at 2:05 PM.  Appropriate orders placed.  Aubrynn Katona was informed that the remainder of the evaluation will be completed by another provider, this initial triage assessment does not replace that evaluation, and the importance of remaining in the ED until their evaluation is complete.     Marita Kansas, PA-C 04/22/23 1406

## 2023-04-23 ENCOUNTER — Inpatient Hospital Stay (HOSPITAL_COMMUNITY): Payer: Medicare Other

## 2023-04-23 DIAGNOSIS — I482 Chronic atrial fibrillation, unspecified: Secondary | ICD-10-CM

## 2023-04-23 DIAGNOSIS — K56609 Unspecified intestinal obstruction, unspecified as to partial versus complete obstruction: Secondary | ICD-10-CM | POA: Diagnosis not present

## 2023-04-23 DIAGNOSIS — F411 Generalized anxiety disorder: Secondary | ICD-10-CM

## 2023-04-23 DIAGNOSIS — E785 Hyperlipidemia, unspecified: Secondary | ICD-10-CM | POA: Diagnosis not present

## 2023-04-23 DIAGNOSIS — E039 Hypothyroidism, unspecified: Secondary | ICD-10-CM

## 2023-04-23 LAB — CBC WITH DIFFERENTIAL/PLATELET
Abs Immature Granulocytes: 0.03 10*3/uL (ref 0.00–0.07)
Basophils Absolute: 0 10*3/uL (ref 0.0–0.1)
Basophils Relative: 0 %
Eosinophils Absolute: 0.1 10*3/uL (ref 0.0–0.5)
Eosinophils Relative: 1 %
HCT: 41.8 % (ref 36.0–46.0)
Hemoglobin: 13.8 g/dL (ref 12.0–15.0)
Immature Granulocytes: 0 %
Lymphocytes Relative: 25 %
Lymphs Abs: 2.2 10*3/uL (ref 0.7–4.0)
MCH: 30.1 pg (ref 26.0–34.0)
MCHC: 33 g/dL (ref 30.0–36.0)
MCV: 91.1 fL (ref 80.0–100.0)
Monocytes Absolute: 0.8 10*3/uL (ref 0.1–1.0)
Monocytes Relative: 9 %
Neutro Abs: 5.8 10*3/uL (ref 1.7–7.7)
Neutrophils Relative %: 65 %
Platelets: 213 10*3/uL (ref 150–400)
RBC: 4.59 MIL/uL (ref 3.87–5.11)
RDW: 13.6 % (ref 11.5–15.5)
WBC: 9 10*3/uL (ref 4.0–10.5)
nRBC: 0 % (ref 0.0–0.2)

## 2023-04-23 LAB — COMPREHENSIVE METABOLIC PANEL
ALT: 17 U/L (ref 0–44)
AST: 19 U/L (ref 15–41)
Albumin: 3.4 g/dL — ABNORMAL LOW (ref 3.5–5.0)
Alkaline Phosphatase: 81 U/L (ref 38–126)
Anion gap: 9 (ref 5–15)
BUN: 15 mg/dL (ref 8–23)
CO2: 25 mmol/L (ref 22–32)
Calcium: 8.8 mg/dL — ABNORMAL LOW (ref 8.9–10.3)
Chloride: 104 mmol/L (ref 98–111)
Creatinine, Ser: 0.7 mg/dL (ref 0.44–1.00)
GFR, Estimated: >60 mL/min (ref >60)
Glucose, Bld: 103 mg/dL — ABNORMAL HIGH (ref 70–99)
Potassium: 3.8 mmol/L (ref 3.5–5.1)
Sodium: 138 mmol/L (ref 135–145)
Total Bilirubin: 0.7 mg/dL (ref 0.3–1.2)
Total Protein: 5.9 g/dL — ABNORMAL LOW (ref 6.5–8.1)

## 2023-04-23 LAB — MAGNESIUM: Magnesium: 1.8 mg/dL (ref 1.7–2.4)

## 2023-04-23 LAB — HIV ANTIBODY (ROUTINE TESTING W REFLEX): HIV Screen 4th Generation wRfx: NONREACTIVE

## 2023-04-23 MED ORDER — METOPROLOL TARTRATE 5 MG/5ML IV SOLN: 2.5000 mg | Freq: Three times a day (TID) | INTRAVENOUS | Status: DC | PRN

## 2023-04-23 MED ORDER — SENNOSIDES-DOCUSATE SODIUM 8.6-50 MG PO TABS
1.0000 | ORAL_TABLET | Freq: Two times a day (BID) | ORAL | Status: DC
  Administered 2023-04-23 – 2023-04-24 (×2): 1
  Filled 2023-04-23 (×2): qty 1

## 2023-04-23 MED ORDER — LACTATED RINGERS IV SOLN: INTRAVENOUS | Status: DC

## 2023-04-23 MED ORDER — PROCHLORPERAZINE EDISYLATE 10 MG/2ML IJ SOLN
10.0000 mg | Freq: Once | INTRAMUSCULAR | Status: AC
  Administered 2023-04-23: 10 mg via INTRAVENOUS
  Filled 2023-04-23: qty 2

## 2023-04-23 MED ORDER — ENOXAPARIN SODIUM 100 MG/ML IJ SOSY
85.0000 mg | PREFILLED_SYRINGE | Freq: Two times a day (BID) | INTRAMUSCULAR | Status: DC
  Administered 2023-04-23 – 2023-04-24 (×3): 85 mg via SUBCUTANEOUS
  Filled 2023-04-23 (×3): qty 1

## 2023-04-23 MED ORDER — HYDROMORPHONE HCL 1 MG/ML IJ SOLN
1.0000 mg | INTRAMUSCULAR | Status: DC | PRN
  Administered 2023-04-23 – 2023-04-24 (×3): 1 mg via INTRAVENOUS
  Filled 2023-04-23 (×3): qty 1

## 2023-04-23 MED ORDER — MILK AND MOLASSES ENEMA
1.0000 | Freq: Once | RECTAL | Status: AC
  Administered 2023-04-23: 240 mL via RECTAL

## 2023-04-23 MED ORDER — DIATRIZOATE MEGLUMINE & SODIUM 66-10 % PO SOLN
90.0000 mL | Freq: Once | ORAL | Status: AC
  Administered 2023-04-23: 90 mL via NASOGASTRIC
  Filled 2023-04-23: qty 90

## 2023-04-23 MED ORDER — DIPHENHYDRAMINE HCL 50 MG/ML IJ SOLN
12.5000 mg | Freq: Once | INTRAMUSCULAR | Status: AC
  Administered 2023-04-23: 12.5 mg via INTRAVENOUS
  Filled 2023-04-23: qty 1

## 2023-04-23 MED ORDER — KETOROLAC TROMETHAMINE 15 MG/ML IJ SOLN
15.0000 mg | Freq: Once | INTRAMUSCULAR | Status: AC
  Administered 2023-04-23: 15 mg via INTRAVENOUS
  Filled 2023-04-23: qty 1

## 2023-04-23 NOTE — Consult Note (Signed)
Gastroenterology Consult   Referring Provider: No ref. provider found Primary Care Physician:  Kerri Perches, MD Primary Gastroenterologist:  Dr. Levon Hedger  Patient ID: [name]; [id number]; 1961-11-22   Admit date: 04/22/2023  LOS: 1 day   Date of Consultation: 04/23/2023  Reason for Consultation:  small bowel obstruction  History of Present Illness   [name] is a 62 y.o. year old female with history of abdominal hernia, chronic pain, GERD, HLD, paroxysmal SVT, hypothyroidism, anxiety, gastroparesis, anemia, thyroid disease, DVT and PE on Xarelto, A-fib s/p ablation, fibromyalgia, complicated GI history including partial colectomy due to redundant colon with primary anastomosis, perforated bowel related to gynecologic procedure complicated by [facility] fistula requiring multiple surgeries and multiple SBO's with admission for partial SBO in October 2022 and readmission in November 2022 with suspected pseudoobstruction, gastroparesis, small bowel dysmotility.  Also with recent hospitalization in April and June 2023.  She underwent EGD with pyloric Botox treatment 04/01/2023.  She presented to the ED 4/25 with complaint of worsening abdominal pain with evidence of small bowel obstruction with transition point on imaging, nausea, and vomiting.  GI consulted for further recommendations on management of small bowel obstruction.  ED Course: Vital signs stable with marginally low BP with systolics in the 90s. Labs: Lipase 40, hemoglobin 15.3, platelets 232, WBC 8.6, glucose 121, creatinine 0.8, potassium 4.1, sodium 137, lactic acid 1.4 UA unremarkable NG tube inserted CT A/P 04/22/23: Changes consistent with small bowel obstruction with transition zone likely in the anterior abdomen at the site of anastomosis.  Partial colectomy with ileocolonic anastomosis stool-filled colon.  KUB 04/23/23: -Sideport of NG tube in the fundus of the stomach  -partial decompression of  small bowel loops in the left upper quadrant   Previous imaging and endoscopic evaluation: EGD 03/31/22: -Salmon-colored mucosa suspicious for short segment Barrett's esophagus s/p biopsy -Normal stomach injected with botulinum toxin -Single duodenal polyp resected -Duodenal polyp with chronic inflammation and focal gastric foveolar metaplasia without dysplasia -GE junction biopsy negative for intestinal metaplasia or dysplasia  Barium enema Xray 07/02/22 with [facility]: -Long segment narrowing of the ileosigmoid anastomosis to a luminal diameter of approximately 7 mm with slow transit of contrast into the distal small bowel and mild upstream small bowel dilation  Flexible sigmoidoscopy 08/06/22 with [facility]: -Stool in the rectum and sigmoid colon -Anastomosis unable to be reached -Advise repeat flex sig for reattempted treatment with 2-day colon prep  Flex Sigmoidoscopy 10/15/22 with [facility]: -Preparation of the colon was poor -Stool in the rectum and rectosigmoid colon -Stricture at the colonic anastomosis s/p dilation  Consult with [facility] 04/12/23. Unable to review clinic note from Dr. Waynard Reeds however per note from registered dietitian she reports the patient is taking laxatives and stool softeners to help with constipation however continues to have about 4 bowel movements per day, sometimes 10 but always with watery stools.  She has been following with nutrition for gastroparesis diet.  Reports during flare she is unable to tolerate anything but usually consumes toast, grits, cereal, soups and crackers.  She has an high aversion to certain foods based off of smells.  She does report drinking a lot of Gatorade, juice, and water.  She also significant amount of weight in 2023.  Started around 240 pounds and was down to 150 pounds in the summer 2023.  Most recent weight in the 190s.   Today: Has been drinking liquid electrolyte packets at home and has been following  with  nutrition at [facility]. She has bene doing a lot of trial and error.   The day before presentation she got sick. She was feeling abdominal contractions and said she knew it was on obstruction because it has felt like her prior episodes. It eases off a little and then comes back. She reports [facility] told her her bowel was twisted and she's had dilations and it keeps coming back. They reported that until her bowel started to get ischemic she would not be able to get a small bowel transplant. She reported that it would need to be life and death in order to have surgery again.   Had pain after going to the bathroom but right now not really having any abdominal pain.   She was vomiting a foam previously and felt as though her stomach and bowel were full.   She reports chronic issue with constipation and redundant colons. Reports she did well with mol assess enema earlier today and had good relief afterwards and states she can feel a difference. She can feel things moving within her colon. She felt as though she had days worth of stool and saw undigested food in her stool.   Has been using Amitiza and erythromycin 250 TID and [employer] brand stool softener with laxative as needed. Stays drinking liquids but reports she stays dehydrated. She reported she was going regularly and then started slowing down. When she slowed down she was increasing her laxative use and still was not able to go.   Last BM was a very small amount on 4/24 and shortly after is when her pain started.    Past Medical History:  Diagnosis Date   Abdominal hernia 08/14/2019   Abdominal pain 06/06/2019   Acute bronchitis 05/01/2013   Acute cystitis 06/09/2010   Qualifier: Diagnosis of  By: Lillia Mountain LPN, Brandi     Acute renal insufficiency 08/28/2011   Allergy    Phreesia 03/16/2021   Anemia    Phreesia 10/29/2020   Anxiety    Anxiety state 03/16/2008   Qualifier: Diagnosis of  By: Lind Guest     Arthritis    Phreesia 10/29/2020   Back pain  with radiation 07/17/2014   Bilateral hand pain 07/27/2016   Blood transfusion without reported diagnosis    Phreesia 10/29/2020   Breast cancer (HCC)    L breast- 2006   Cancer (HCC)    Phreesia 10/29/2020   Carpal tunnel syndrome    Chronic constipation    Chronic pain syndrome    followed by [facility]---  back   Clotting disorder (HCC)    Phreesia 10/29/2020   Depression    Dermatitis 12/15/2011   Difficult intubation    Educated about COVID-19 virus infection 05/14/2019   Fall at home 01/26/2016   Family history of adverse reaction to anesthesia    MOTHER--- PONV   Fatigue 09/17/2012   Fibromyalgia    FIBROMYALGIA 03/16/2008   Qualifier: Diagnosis of  By: Lind Guest     GERD (gastroesophageal reflux disease)    Headache disorder 01/16/2013   Headache syndrome 01/26/2016   Hip pain, right 07/08/2019   History of MRSA infection    lip abscess   History of ovarian cyst 06/2011   s/p  BSO   History of pulmonary embolus (PE) 1997   post EP with ablation pulmonary veouns for SVT/ Atrial Fib.   History of supraventricular tachycardia    s/p  ablation 1996  and 1997  by dr  klein   History of TIA (transient ischemic attack) 1997   post op EP ablation PE   Hyperlipidemia    Hyperlipidemia LDL goal <100 03/16/2008   Qualifier: Diagnosis of  By: Bullins, Luann     Hypothyroidism    followed by pcp   Incisional hernia    Insomnia 12/15/2011   Intermittent palpitations 08/22/2017   Interstitial cystitis    09-13-2018   per pt last flare-up  May 2019 (followed by pcp)   INTERSTITIAL CYSTITIS 02/17/2011   Qualifier: Diagnosis of  By: Lodema Hong MD, Margaret     Iron deficiency anemia    09-13-2018  PER PT STABLE   Irregular heart rate 07/25/2013   Lipoma of back    upper   Low back pain radiating to right leg 07/04/2019   Low ferritin level 06/14/2014   MDD (major depressive disorder), single episode, in full remission (HCC) 10/27/2017   Medically noncompliant 02/28/2015    Multiple missed appointments, both follow-up appointments and lab appointments.    Metabolic syndrome X 02/10/2011   Qualifier: Diagnosis of  By: Lodema Hong MD, Margaret  hBA1c is 5.8 in 02/2013    Migraines    Morbid obesity (HCC) 03/27/2013   Muscle spasm 01/03/2020   Nausea alone 07/17/2014   NECK PAIN, CHRONIC 03/16/2008   Qualifier: Diagnosis of  By: Lind Guest     Normal coronary arteries    a. by CT 12/2018.   Obesity 03/16/2008   Qualifier: Diagnosis of  By: Lind Guest     Oral ulceration 08/23/2014   Presented at 06/14/2014 visit    Other malaise and fatigue 03/16/2008   Centricity Description: FATIGUE, CHRONIC Qualifier: Diagnosis of  By: Lind Guest   Centricity Description: FATIGUE Qualifier: Diagnosis of  By: Garnette Czech PA, Dawn     OVARIAN CYST 12/26/2009   Qualifier: Diagnosis of  By: Lodema Hong MD, Margaret     Paget disease of breast, left [facility])    Paget's disease of breast, left (HCC) 03/16/2008   Qualifier: Diagnosis of  By: Lind Guest  Left diagnosed in 2006 F/h breast cancer x 15 family members   Partial small bowel obstruction (HCC) 07/04/2012   PONV (postoperative nausea and vomiting)    SEVERE   Postsurgical menopause 11/13/2011   Presence of IVC filter 06/06/2019   PSVT (paroxysmal supraventricular tachycardia)    HX ABLATION 1996 AND 1997   Recurrent oral herpes simplex infection 10/13/2017   ROM (right otitis media) 01/23/2013   S/P insertion of IVC (inferior vena caval) filter 05/08/2005   [facility] (non-retrievable)  /  dx 2019  a leg of filter is protruding thru the vena cava in to right L2 vertebral body (09-13-2018  per pt having surgery to remove filter in [place])   S/P radiofrequency ablation operation for arrhythmia 1996   1996  and 1997,   SVT and Atrial Fib   Sinusitis, chronic 01/26/2016   SMALL BOWEL OBSTRUCTION, HX OF 08/07/2008   Annotation: obstruction w/ adhesions led to partial colectomy Qualifier: Diagnosis of  By: Minna Merritts     Stroke  [facility])    Phreesia 10/29/2020, TIAs in the past   Swelling of hand 09/17/2012   Syncope    Tachycardia 02/03/2010   Qualifier: Diagnosis of  By: Via LPN, Lynn     Thyroid disease    Phreesia 10/29/2020   Ulcer 12/15/2011   Urinary frequency 07/18/2014   Vaginitis and vulvovaginitis 05/10/2013   Vitamin D deficiency 05/05/2015   Wears glasses  Past Surgical History:  Procedure Laterality Date   ABDOMINAL HYSTERECTOMY  1987   ANTERIOR CERVICAL DECOMP/DISCECTOMY FUSION  03-07-2002   dr elsner  @[facility]    C 4 -- 5   APPENDECTOMY  1980   AUGMENTATION MAMMAPLASTY Right 2006   BILATERAL SALPINGOOPHORECTOMY  07/24/2011   via Explor. Lap. w/ intraoperative perf. bowel repair   BIOPSY  05/02/2021   Procedure: BIOPSY;  Surgeon: Marguerita Merles, Reuel Boom, MD;  [facility];  Service: Gastroenterology;;  small bowel, mid esophagus, distal esophagus, random colon biopsies   BIOPSY  12/04/2021   Procedure: BIOPSY;  Surgeon: Lanelle Bal, DO;  [facility];  Service: Endoscopy;;   BIOPSY  03/31/2022   Procedure: BIOPSY;  Surgeon: Dolores Frame, MD;  [facility];  Service: Gastroenterology;;   BOTOX INJECTION N/A 03/31/2022   Procedure: BOTOX INJECTION;  Surgeon: Dolores Frame, MD;  [facility];  Service: Gastroenterology;  Laterality: N/A;   BREAST BIOPSY Right 2019   benign   BREAST ENHANCEMENT SURGERY Bilateral 1993   BREAST IMPLANT REMOVAL Bilateral    BREAST SURGERY N/A    Phreesia 10/29/2020   CARDIAC CATHETERIZATION  07-06-2003   dr Smitty Cords brodie   normal coronaries and LVF   CARDIAC ELECTROPHYSIOLOGY STUDY AND ABLATION  1996  and 1997   CARDIOVASCULAR STRESS TEST  11-18-2015   dr Graciela Husbands   normal nuclear study w/ no ischemia/  normal LV function and wall motion , ef 84%   CARPAL TUNNEL RELEASE Right ?   CESAREAN SECTION  1986   CESAREAN SECTION N/A    Phreesia 10/29/2020   CHOLECYSTECTOMY N/A    Phreesia 10/29/2020    COLON SURGERY     COLONOSCOPY WITH PROPOFOL N/A 05/02/2021   Procedure: COLONOSCOPY WITH PROPOFOL;  Surgeon: Dolores Frame, MD;  [facility];  Service: Gastroenterology;  Laterality: N/A;  12:30 PM   CYSTO/  HYDRODISTENTION/  INSTILATION THERAPY  MULTIPLE   ENTEROCUTANEOUS FISTULA CLOSURE  multiple   last one 2015 with small bowel resection   ESOPHAGOGASTRODUODENOSCOPY (EGD) WITH PROPOFOL N/A 05/02/2021   Procedure: ESOPHAGOGASTRODUODENOSCOPY (EGD) WITH PROPOFOL;  Surgeon: Dolores Frame, MD;  [facility];  Service: Gastroenterology;  Laterality: N/A;   ESOPHAGOGASTRODUODENOSCOPY (EGD) WITH PROPOFOL N/A 12/04/2021   Procedure: ESOPHAGOGASTRODUODENOSCOPY (EGD) WITH PROPOFOL;  Surgeon: Lanelle Bal, DO;  [facility];  Service: Endoscopy;  Laterality: N/A;   ESOPHAGOGASTRODUODENOSCOPY (EGD) WITH PROPOFOL N/A 03/31/2022   Procedure: ESOPHAGOGASTRODUODENOSCOPY (EGD) WITH PROPOFOL;  Surgeon: Dolores Frame, MD;  [facility];  Service: Gastroenterology;  Laterality: N/A;  1015   EXPLORATORY LAPAROTOMY INCISIONAL VENTRAL HERNIA REPAIR / RESECTION SMALL BOWEL  11-09-2014   @[facility]    FRACTURE SURGERY N/A    Phreesia 10/29/2020   JOINT REPLACEMENT N/A    Phreesia 10/29/2020   LIPOMA EXCISION Right 09/16/2018   Procedure: EXCISION LIPOMA UPPER BACK;  Surgeon: Berna Bue, MD;  Location: [facility];  Service: General;  Laterality: Right;   MASTECTOMY Left 2006   w/ reconstruction on left (paget's disease)  and right breast augmentation   POLYPECTOMY  05/02/2021   Procedure: POLYPECTOMY;  Surgeon: Dolores Frame, MD;  [facility];  Service: Gastroenterology;;  gastric   POLYPECTOMY  03/31/2022   Procedure: POLYPECTOMY;  Surgeon: Dolores Frame, MD;  [facility];  Service: Gastroenterology;;   Gaspar Bidding DILATION  05/02/2021   Procedure: SAVORY DILATION;  Surgeon:  Marguerita Merles, Reuel Boom, MD;  [facility];  Service: Gastroenterology;;   SMALL INTESTINE SURGERY N/A    Phreesia 10/29/2020   SPINE SURGERY N/A    Phreesia 10/29/2020   TOTAL COLECTOMY  08-04-2002    @[facility]    AND CHOLECYSTECTOMY  (colonic inertia)   TRANSTHORACIC ECHOCARDIOGRAM  02/04/2016   ef 60-65%,  grade 2 diastolic dysfunction/  mild MR   TUBAL LIGATION N/A    Phreesia 03/16/2021   VENA CAVA FILTER PLACEMENT  05/08/2005   @[facility]    [facility] (non-retrievable)   WIDE EXCISION PERIRECTAL ABSCESSES  09-22-2005   @ [facility]    Prior to Admission medications   Medication Sig Start Date End Date Taking? Authorizing Provider  ALPRAZolam Prudy Feeler) 1 MG tablet Take one tablet by mouth two times daily for anxiety 02/10/23  Yes Kerri Perches, MD  aspirin 81 MG chewable tablet Chew 81 mg by mouth daily.   Yes [provider]  BIOTIN PO Take 1 capsule by mouth daily.   Yes [provider]  butalbital-acetaminophen-caffeine (FIORICET) 50-325-40 MG tablet Take 1 tablet by mouth every 6 (six) hours as needed. 04/11/22  Yes [provider]  Cholecalciferol (VITAMIN D3) 1.25 MG (50000 UT) CAPS Take 1 capsule by mouth every 30 (thirty) days.   Yes [provider]  conjugated estrogens (PREMARIN) vaginal cream INSERT 1 APPLICATORFUL VAGINALLY DAILY Patient taking differently: Place 1 applicator vaginally at bedtime. 12/11/22  Yes Kerri Perches, MD  cyanocobalamin (VITAMIN B12) 1000 MCG/ML injection Inject 1 mL (1,000 mcg total) into the muscle every 30 (thirty) days. 11/16/22  Yes Pennington, Rebekah M, PA-C  cyclobenzaprine (FLEXERIL) 10 MG tablet TAKE 1 TABLET BY MOUTH TWICE DAILY AS NEEDED FOR MUSCLE SPASMS Patient taking differently: Take 10 mg by mouth 2 (two) times daily. 02/21/20  Yes Kerri Perches, MD  cycloSPORINE (RESTASIS) 0.05 % ophthalmic emulsion Place 1 drop into both eyes 2 (two) times daily as needed (dry eyes).   Yes [provider]  DULoxetine (CYMBALTA) 60 MG capsule Take 60 mg by mouth daily. 02/09/22  Yes [provider]  erythromycin (ERY-TAB) 250 MG EC tablet Take 1 tablet (250 mg total) by mouth 3 (three) times daily before meals. TAKE 1 TABLET BY MOUTH THREE TIMES DAILY BEFORE MEALS Strength: 250 mg 07/18/22  Yes Dolores Frame, MD  ezetimibe (ZETIA) 10 MG tablet TAKE 1 TABLET(10 MG) BY MOUTH DAILY Patient taking differently: Take 10 mg by mouth daily. 10/01/22  Yes Kerri Perches, MD  fluticasone (FLONASE) 50 MCG/ACT nasal spray Place 2 sprays into both nostrils daily as needed for allergies. 12/05/21  Yes Johnson, Clanford L, MD  HYDROmorphone (DILAUDID) 4 MG tablet Take 4 mg by mouth 3 (three) times daily.   Yes [provider]  levothyroxine (SYNTHROID) 125 MCG tablet TAKE 1 TABLET(125 MCG) BY MOUTH DAILY BEFORE BREAKFAST Patient taking differently: Take 125 mcg by mouth daily before breakfast. 12/04/22  Yes Kerri Perches, MD  lubiprostone (AMITIZA) 24 MCG capsule TAKE 1 CAPSULE(24 MCG) BY MOUTH TWICE DAILY WITH A MEAL Patient taking differently: Take 24 mcg by mouth 2 (two) times daily with a meal. 03/03/23  Yes Carlan, Chelsea L, NP  magnesium oxide (MAG-OX) 400 (240 Mg) MG tablet TAKE 1 TABLET BY MOUTH FOUR TIMES DAILY Patient taking differently: Take 400 mg by mouth 2 (two) times daily. 03/25/23  Yes Kerri Perches, MD  metoprolol succinate (TOPROL-XL) 25 MG 24 hr tablet Take 1  tablet (25 mg total) by mouth daily. 04/15/23  Yes Chandrasekhar, Mahesh A, MD  Multiple Vitamins-Minerals (ICAPS AREDS 2 PO) Take 1 capsule by mouth every evening.   Yes [provider]  omeprazole (PRILOSEC) 40 MG capsule Take 1 capsule (40 mg total) by mouth in the morning and at bedtime. 11/18/21  Yes Erick Blinks, MD  OVER THE COUNTER MEDICATION Nutra-ful   Yes [provider]  phenazopyridine (PYRIDIUM) 95 MG tablet Take by mouth.   Yes [provider]   polyethylene glycol (MIRALAX / GLYCOLAX) 17 g packet Take by mouth.   Yes [provider]  potassium chloride (KLOR-CON M) 10 MEQ tablet TAKE 1 TABLET(10 MEQ) BY MOUTH DAILY Patient taking differently: Take 10 mEq by mouth every evening. 03/15/23  Yes Kerri Perches, MD  sennosides-docusate sodium (SENOKOT-S) 8.6-50 MG tablet as needed.   Yes [provider]  XARELTO 20 MG TABS tablet TAKE 1 TABLET(20 MG) BY MOUTH DAILY Patient taking differently: Take 20 mg by mouth in the morning. 03/24/23  Yes Kerri Perches, MD  levofloxacin (LEVAQUIN) 750 MG tablet Take 750 mg by mouth daily. Patient not taking: Reported on 04/22/2023 12/04/22   [provider]  naloxone [facility]) nasal spray 4 mg/0.1 mL SMARTSIG:Both Nares Patient not taking: Reported on 04/22/2023 01/15/23   [provider]  pravastatin (PRAVACHOL) 20 MG tablet TAKE 1 TABLET(20 MG) BY MOUTH DAILY Patient taking differently: Take 20 mg by mouth every evening. 11/04/22   Kerri Perches, MD    Current Facility-Administered Medications  Medication Dose Route Frequency Provider Last Rate Last Admin   acetaminophen (TYLENOL) tablet 650 mg  650 mg Oral Q6H PRN Zierle-Ghosh, Asia B, DO       Or   acetaminophen (TYLENOL) suppository 650 mg  650 mg Rectal Q6H PRN Zierle-Ghosh, Asia B, DO       diphenhydrAMINE (BENADRYL) injection 25 mg  25 mg Intravenous Q8H PRN Zierle-Ghosh, Asia B, DO       enoxaparin (LOVENOX) injection 85 mg  85 mg Subcutaneous Q12H Zierle-Ghosh, Asia B, DO   85 mg at 04/23/23 1610   lactated ringers infusion   Intravenous Continuous Nevin Bloodgood A, MD 125 mL/hr at 04/23/23 0829 New Bag at 04/23/23 0829   metoprolol tartrate (LOPRESSOR) injection 2.5 mg  2.5 mg Intravenous Q8H PRN Zierle-Ghosh, Asia B, DO       morphine (PF) 2 MG/ML injection 2 mg  2 mg Intravenous Q2H PRN Zierle-Ghosh, Asia B, DO   2 mg at 04/23/23 0830   ondansetron (ZOFRAN) tablet 4 mg  4 mg Oral Q6H PRN  Zierle-Ghosh, Asia B, DO       Or   ondansetron (ZOFRAN) injection 4 mg  4 mg Intravenous Q6H PRN Zierle-Ghosh, Asia B, DO   4 mg at 04/22/23 2341   senna-docusate (Senokot-S) tablet 1 tablet  1 tablet Per Tube BID Nevin Bloodgood A, MD   1 tablet at 04/23/23 1004    Allergies as of 04/22/2023 - Review Complete 04/22/2023  Allergen Reaction Noted   Nitrofurantoin Hives 03/16/2008   Crestor [rosuvastatin calcium] Other (See Comments) 08/08/2019   Bisacodyl Other (See Comments) 07/03/2013   Codeine Itching 03/16/2008   Iron sucrose Other (See Comments) 03/10/2021   Monosodium glutamate Other (See Comments) 06/26/2014   Scopolamine hbr Other (See Comments) 08/29/2011   Other Hives, Itching, Swelling, Rash, and Other (See Comments) 11/11/2020    Family History  Problem Relation Age of Onset   Diabetes  Mother    Heart disease Mother    Hypertension Mother    Heart disease Father    Hyperlipidemia Father    Hypertension Father    Alcohol abuse Father    Colon cancer Maternal Aunt    Breast cancer Maternal Aunt    Cancer Maternal Uncle        mets   Bone cancer Maternal Grandfather        mets   Ovarian cancer Cousin 19   Breast cancer Cousin    Prostate cancer Maternal Uncle    Breast cancer Maternal Aunt    Brain cancer Maternal Aunt    Breast cancer Maternal Aunt     Social History   Socioeconomic History   Marital status: Married    Spouse name: Not on file   Number of children: Not on file   Years of education: Not on file   Highest education level: Not on file  Occupational History   Not on file  Tobacco Use   Smoking status: Never   Smokeless tobacco: Never  Vaping Use   Vaping Use: Never used  Substance and Sexual Activity   Alcohol use: No    Comment: twice per year   Drug use: No   Sexual activity: Yes    Birth control/protection: Surgical  Other Topics Concern   Not on file  Social History Narrative   Not on file   Social Determinants of Health    Financial Resource Strain: Low Risk  (07/24/2022)   Overall Financial Resource Strain (CARDIA)    Difficulty of Paying Living Expenses: Not hard at all  Food Insecurity: No Food Insecurity (05/14/2021)   Hunger Vital Sign    Worried About Running Out of Food in the Last Year: Never true    Ran Out of Food in the Last Year: Never true  Transportation Needs: Unknown (07/24/2022)   PRAPARE - Administrator, Civil Service (Medical): Not on file    Lack of Transportation (Non-Medical): No  Physical Activity: Insufficiently Active (07/24/2022)   Exercise Vital Sign    Days of Exercise per Week: 3 days    Minutes of Exercise per Session: 30 min  Stress: No Stress Concern Present (07/24/2022)   [employer] of Occupational Health - Occupational Stress Questionnaire    Feeling of Stress : Only a little  Social Connections: Moderately Integrated (07/24/2022)   Social Connection and Isolation Panel [NHANES]    Frequency of Communication with Friends and Family: More than three times a week    Frequency of Social Gatherings with Friends and Family: More than three times a week    Attends Religious Services: Never    Database administrator or Organizations: Yes    Attends Banker Meetings: Never    Marital Status: Married  Catering manager Violence: Not At Risk (07/24/2022)   Humiliation, Afraid, Rape, and Kick questionnaire    Fear of Current or Ex-Partner: No    Emotionally Abused: No    Physically Abused: No    Sexually Abused: No     Review of Systems   Gen: Denies any fever, chills, loss of appetite, change in weight or weight loss CV: Denies chest pain, heart palpitations, syncope, edema  Resp: Denies shortness of breath with rest, cough, wheezing, coughing up blood, and pleurisy. GI: see HPI GU : Denies urinary burning, blood in urine, urinary frequency, and urinary incontinence. MS: Denies joint pain, limitation of movement, swelling, cramps, and  atrophy.  Derm: Denies rash, itching, dry skin, hives. Psych: Denies depression, anxiety, memory loss, hallucinations, and confusion. Heme: Denies bruising or bleeding Neuro:  Denies any headaches, dizziness, paresthesias, shaking  Physical Exam   Vital Signs in last 24 hours: Temp:  [98 F (36.7 C)-98.4 F (36.9 C)] 98.3 F (36.8 C) (04/26 0558) Pulse Rate:  [81-95] 89 (04/26 0558) Resp:  [16-19] 17 (04/26 0558) BP: (94-139)/(61-104) 105/92 (04/26 0558) SpO2:  [93 %-98 %] 97 % (04/26 0558) Weight:  [84.9 kg-86.2 kg] 84.9 kg (04/25 2323) Last BM Date : 04/21/23  General:   Alert,  Well-developed, well-nourished, pleasant and cooperative in NAD Head:  Normocephalic and atraumatic. Eyes:  Sclera clear, no icterus.   Conjunctiva pink. Ears:  Normal auditory acuity. Mouth:  No deformity or lesions, dentition normal. Neck:  Supple; no masses Lungs:  Clear throughout to auscultation.   No wheezes, crackles, or rhonchi. No acute distress. Heart:  Regular rate and rhythm; no murmurs, clicks, rubs,  or gallops. Abdomen:  Soft, nondistended. Mild tenderness. Scarring noted throughout. Intermittent tympanic bowel sounds, mildly hypoactive. No masses, hepatosplenomegaly or hernias noted. Normal bowel sounds, without guarding, and without rebound.  Rectal: deferred Msk:  Symmetrical without gross deformities. Normal posture. Extremities:  Without clubbing or edema. Neurologic:  Alert and  oriented x4. Skin:  Intact without significant lesions or rashes. Psych:  Alert and cooperative. Normal mood and affect.  Intake/Output from previous day: 04/25 0701 - 04/26 0700 In: 329.7 [I.V.:329.7] Out: -  Intake/Output this shift: No intake/output data recorded.  Labs/Studies   Recent Labs Recent Labs    04/22/23 1414 04/23/23 0424  WBC 8.6 9.0  HGB 15.3* 13.8  HCT 45.8 41.8  PLT 232 213   BMET Recent Labs    04/22/23 1414 04/23/23 0424  NA 137 138  K 4.1 3.8  CL 102 104  CO2  24 25  GLUCOSE 121* 103*  BUN 10 15  CREATININE 0.80 0.70  CALCIUM 9.4 8.8*   LFT Recent Labs    04/22/23 1414 04/23/23 0424  PROT 6.7 5.9*  ALBUMIN 4.0 3.4*  AST 21 19  ALT 19 17  ALKPHOS 97 81  BILITOT 0.6 0.7   PT/INR No results for input(s): "LABPROT", "INR" in the last 72 hours. Hepatitis Panel No results for input(s): "HEPBSAG", "HCVAB", "HEPAIGM", "HEPBIGM" in the last 72 hours. C-Diff No results for input(s): "CDIFFTOX" in the last 72 hours.  Radiology/Studies DG Abd 1 View  Result Date: 04/23/2023 CLINICAL DATA:  Small bowel obstruction. EXAM: ABDOMEN - 1 VIEW COMPARISON:  CT of the abdomen and pelvis 04/22/2023 FINDINGS: Side port of the NG tube is in the fundus the stomach. Gas-filled loops of small bowel in the left upper quadrant are partially decompressed. Gas and stool is again noted within the colon. Degenerative changes are present in the lower lumbar spine. Surgical clips are present at the gallbladder fossa and over the left abdomen. IMPRESSION: 1. Side port of the NG tube is in the fundus the stomach. 2. Partial decompression of small bowel loops in the left upper quadrant. Electronically Signed   By: Marin Roberts M.D.   On: 04/23/2023 08:19   DG Chest Port 1 View  Result Date: 04/22/2023 CLINICAL DATA:  Nasogastric tube placement. EXAM: PORTABLE CHEST 1 VIEW COMPARISON:  December 01, 2021 FINDINGS: A nasogastric tube is seen with its distal end overlying the body of the stomach. A distal side hole is approximately 6.7 cm distal to the expected  region of the gastroesophageal junction. The heart size and mediastinal contours are within normal limits. Low lung volumes are noted with mild elevation of the right hemidiaphragm. Mild, diffuse, increased interstitial lung markings are noted with mild areas of linear atelectasis seen within the bilateral lung bases. There is no evidence of a pleural effusion or pneumothorax. The visualized skeletal structures are  unremarkable. IMPRESSION: 1. Nasogastric tube positioning, as described above. 2. Mild bibasilar linear atelectasis. Electronically Signed   By: Aram Candela M.D.   On: 04/22/2023 21:51   CT ABDOMEN PELVIS WO CONTRAST  Result Date: 04/22/2023 CLINICAL DATA:  Acute nonlocalized abdominal pain. Left-sided abdominal pain radiating to the right. Nausea. Previous history of bowel obstructions. EXAM: CT ABDOMEN AND PELVIS WITHOUT CONTRAST TECHNIQUE: Multidetector CT imaging of the abdomen and pelvis was performed following the standard protocol without IV contrast. RADIATION DOSE REDUCTION: This exam was performed according to the departmental dose-optimization program which includes automated exposure control, adjustment of the mA and/or kV according to patient size and/or use of iterative reconstruction technique. COMPARISON:  06/03/2022 FINDINGS: Lower chest: Linear atelectasis in the lung bases. Right breast implant. Fatty infiltration in the region of the left breast tissue consistent with fat necrosis. This is possibly postoperative or posttraumatic. Similar appearance to previous study. Hepatobiliary: No focal liver abnormality is seen. Status post cholecystectomy. No biliary dilatation. Pancreas: Unremarkable. No pancreatic ductal dilatation or surrounding inflammatory changes. Spleen: Normal in size without focal abnormality. Adrenals/Urinary Tract: Adrenal glands are unremarkable. Kidneys are normal, without renal calculi, focal lesion, or hydronephrosis. Bladder is unremarkable. Stomach/Bowel: Stomach is distended with fluid and ingested material. Proximal small bowel are distended. Transition zone to mostly decompressed distal small bowel. There appears to be partial right hemicolectomy with ileocolonic anastomosis. Additional surgical clips throughout the abdomen. Location of obstruction may be localized to an anterior anastomosis just deep to the anterior abdominal wall. Stool-filled colon.  Vascular/Lymphatic: No significant vascular findings are present. No enlarged abdominal or pelvic lymph nodes. Reproductive: No pelvic mass. Other: No free air or free fluid in the abdomen. Scarring and deformity of the anterior abdominal wall is likely postoperative. Musculoskeletal: No acute or significant osseous findings. IMPRESSION: Changes consistent with small-bowel obstruction with transition zone likely in the anterior abdomen at the site of anastomosis. Partial colectomy with ileocolonic anastomosis and stool-filled colon. Electronically Signed   By: Burman Nieves M.D.   On: 04/22/2023 20:32     Assessment   Ariannah Arenson is a 62 y.o. year old female with history of abdominal hernia, chronic pain, GERD, HLD, paroxysmal SVT, hypothyroidism, anxiety, gastroparesis, anemia, thyroid disease, DVT and PE on Xarelto, A-fib s/p ablation, fibromyalgia, complicated GI history including partial colectomy due to redundant colon with primary anastomosis, perforated bowel related to gynecologic procedure complicated by [facility] fistula requiring multiple surgeries and multiple SBO's with admission for partial SBO in October 2022 and readmission in November 2022 with suspected pseudoobstruction, gastroparesis, small bowel dysmotility.  Also with recent hospitalization in April and June 2023.  She underwent EGD with pyloric Botox treatment 04/01/2023.  She presented to the ED 4/25 with complaint of worsening abdominal pain with evidence of small bowel obstruction with transition point on imaging, nausea, and vomiting.  GI consulted for further recommendations on management of small bowel obstruction.  Small bowel obstruction: Presented to the ED with severe abdominal cramping no bowel movement for few days.  She reports the pain is typical to her prior bowel obstructions and not consistent with her  pain related to gastroparesis.  She has extensive abdominal surgical history related to frequent small bowel  obstructions and complications of anastomotic strictures.  She currently follows with [facility] and underwent dilation of colonic anastomotic stricture in October 2023.  She presented mostly with pain but with a little bit of nausea.  CT revealed changes consistent with small bowel obstruction with transition zone likely in the anterior abdomen at the site of anastomosis, partial colectomy with ileocolonic anastomosis and stool-filled colon. She has been taking Amitiza twice daily and stool softener/laxative combo as needed and had been doing well up until the last few weeks where she seen a decrease in bowel movements and an increase in laxative use until she stopped having a bowel movement.  NG tube placed in the ED she reports some relief of abdominal distention with this.  She has seen [facility] transplant clinic for consideration of small bowel transplant but has been told that until she has evidence of ischemic disease that she would not receive a small bowel transplant.  She has already been seen by general surgery who was also consulted who recommended molasses enema which she has had significant improvement in abdominal distention and discomfort.  She is also awaiting small bowel follow-through and if evidence of obstruction secondary to stricture or adhesive disease he would recommend transfer to [facility] for further evaluation and management.  Gastroparesis, chronic abdominal pain: Has been following with [facility] intestinal failure clinic and following with a registered dietitian to aid in better disease management of her gastroparesis.  At home she is maintained on erythromycin 250 mg 3 times daily.  She reports constantly being dehydrated at home but is working on electrolyte supplements and drinking lots of water.  Currently without any significant nausea or vomiting.  Plan / Recommendations    Continue NG tube NPO Appreciate general surgery recommendations, follow up small bowel follow through IV  hydration Monitoring of electrolytes, replace as needed.  Judicial use of pain medications given her small bowel dysmotility Keep follow-up with [facility] 05/18/2023      04/23/2023, 10:04 AM  Brooke Bonito, MSN, FNP-BC, AGACNP-BC [facility]

## 2023-04-23 NOTE — Consult Note (Signed)
Reason for Consult: Small bowel obstruction, gastroparesis Referring Physician: Dr. Tanna Savoy [name] is an 62 y.o. female.  HPI: Patient is a 62 year old white female with a complicated history including a partial colectomy, GYN surgery, gastroparesis, enterocutaneous fistulas, and obstructive symptoms who presents with a 24-hour history of worsening mid abdominal cramping.  She does feel the urge to have a bowel movement, but she has not had 1.  She has not passed flatus recently.  She has had some nausea with emesis.  She recently was evaluated by [facility] abdominal transplant team.  She was told that she was not going to be put on the list for transplant until she is showing signs of bowel ischemia.  She has been followed by gastroenterology for her bowel dysmotility and gastroparesis.  She does have some abdominal cramping but does not have significant crescendo constant abdominal pain.  Past Medical History:  Diagnosis Date   Abdominal hernia 08/14/2019   Abdominal pain 06/06/2019   Acute bronchitis 05/01/2013   Acute cystitis 06/09/2010   Qualifier: Diagnosis of  By: Lillia Mountain LPN, Brandi     Acute renal insufficiency 08/28/2011   Allergy    Phreesia 03/16/2021   Anemia    Phreesia 10/29/2020   Anxiety    Anxiety state 03/16/2008   Qualifier: Diagnosis of  By: Lind Guest     Arthritis    Phreesia 10/29/2020   Back pain with radiation 07/17/2014   Bilateral hand pain 07/27/2016   Blood transfusion without reported diagnosis    Phreesia 10/29/2020   Breast cancer (HCC)    L breast- 2006   Cancer (HCC)    Phreesia 10/29/2020   Carpal tunnel syndrome    Chronic constipation    Chronic pain syndrome    followed by [facility]---  back   Clotting disorder (HCC)    Phreesia 10/29/2020   Depression    Dermatitis 12/15/2011   Difficult intubation    Educated about COVID-19 virus infection 05/14/2019   Fall at home 01/26/2016   Family history of adverse reaction to  anesthesia    MOTHER--- PONV   Fatigue 09/17/2012   Fibromyalgia    FIBROMYALGIA 03/16/2008   Qualifier: Diagnosis of  By: Lind Guest     GERD (gastroesophageal reflux disease)    Headache disorder 01/16/2013   Headache syndrome 01/26/2016   Hip pain, right 07/08/2019   History of MRSA infection    lip abscess   History of ovarian cyst 06/2011   s/p  BSO   History of pulmonary embolus (PE) 1997   post EP with ablation pulmonary veouns for SVT/ Atrial Fib.   History of supraventricular tachycardia    s/p  ablation 1996  and 1997  by dr Graciela Husbands   History of TIA (transient ischemic attack) 1997   post op EP ablation PE   Hyperlipidemia    Hyperlipidemia LDL goal <100 03/16/2008   Qualifier: Diagnosis of  By: Lind Guest     Hypothyroidism    followed by pcp   Incisional hernia    Insomnia 12/15/2011   Intermittent palpitations 08/22/2017   Interstitial cystitis    09-13-2018   per pt last flare-up  May 2019 (followed by pcp)   INTERSTITIAL CYSTITIS 02/17/2011   Qualifier: Diagnosis of  By: Lodema Hong MD, Margaret     Iron deficiency anemia    09-13-2018  PER PT STABLE   Irregular heart rate 07/25/2013   Lipoma of back    upper  Low back pain radiating to right leg 07/04/2019   Low ferritin level 06/14/2014   MDD (major depressive disorder), single episode, in full remission (HCC) 10/27/2017   Medically noncompliant 02/28/2015   Multiple missed appointments, both follow-up appointments and lab appointments.    Metabolic syndrome X 02/10/2011   Qualifier: Diagnosis of  By: Lodema Hong MD, Margaret  hBA1c is 5.8 in 02/2013    Migraines    Morbid obesity (HCC) 03/27/2013   Muscle spasm 01/03/2020   Nausea alone 07/17/2014   NECK PAIN, CHRONIC 03/16/2008   Qualifier: Diagnosis of  By: Lind Guest     Normal coronary arteries    a. by CT 12/2018.   Obesity 03/16/2008   Qualifier: Diagnosis of  By: Lind Guest     Oral ulceration 08/23/2014   Presented at 06/14/2014 visit    Other malaise  and fatigue 03/16/2008   Centricity Description: FATIGUE, CHRONIC Qualifier: Diagnosis of  By: Lind Guest   Centricity Description: FATIGUE Qualifier: Diagnosis of  By: Garnette Czech PA, Dawn     OVARIAN CYST 12/26/2009   Qualifier: Diagnosis of  By: Lodema Hong MD, Margaret     Paget disease of breast, left [facility])    Paget's disease of breast, left (HCC) 03/16/2008   Qualifier: Diagnosis of  By: Lind Guest  Left diagnosed in 2006 F/h breast cancer x 15 family members   Partial small bowel obstruction (HCC) 07/04/2012   PONV (postoperative nausea and vomiting)    SEVERE   Postsurgical menopause 11/13/2011   Presence of IVC filter 06/06/2019   PSVT (paroxysmal supraventricular tachycardia)    HX ABLATION 1996 AND 1997   Recurrent oral herpes simplex infection 10/13/2017   ROM (right otitis media) 01/23/2013   S/P insertion of IVC (inferior vena caval) filter 05/08/2005   [facility] (non-retrievable)  /  dx 2019  a leg of filter is protruding thru the vena cava in to right L2 vertebral body (09-13-2018  per pt having surgery to remove filter in [place])   S/P radiofrequency ablation operation for arrhythmia 1996   1996  and 1997,   SVT and Atrial Fib   Sinusitis, chronic 01/26/2016   SMALL BOWEL OBSTRUCTION, HX OF 08/07/2008   Annotation: obstruction w/ adhesions led to partial colectomy Qualifier: Diagnosis of  By: Minna Merritts     Stroke [facility])    Phreesia 10/29/2020, TIAs in the past   Swelling of hand 09/17/2012   Syncope    Tachycardia 02/03/2010   Qualifier: Diagnosis of  By: Via LPN, Lynn     Thyroid disease    Phreesia 10/29/2020   Ulcer 12/15/2011   Urinary frequency 07/18/2014   Vaginitis and vulvovaginitis 05/10/2013   Vitamin D deficiency 05/05/2015   Wears glasses     Past Surgical History:  Procedure Laterality Date   ABDOMINAL HYSTERECTOMY  1987   ANTERIOR CERVICAL DECOMP/DISCECTOMY FUSION  03-07-2002   dr elsner  @[facility]    C 4 -- 5   APPENDECTOMY  1980   AUGMENTATION MAMMAPLASTY  Right 2006   BILATERAL SALPINGOOPHORECTOMY  07/24/2011   via Explor. Lap. w/ intraoperative perf. bowel repair   BIOPSY  05/02/2021   Procedure: BIOPSY;  Surgeon: Marguerita Merles, Reuel Boom, MD;  [facility];  Service: Gastroenterology;;  small bowel, mid esophagus, distal esophagus, random colon biopsies   BIOPSY  12/04/2021   Procedure: BIOPSY;  Surgeon: Lanelle Bal, DO;  [facility];  Service: Endoscopy;;   BIOPSY  03/31/2022   Procedure: BIOPSY;  Surgeon: Marguerita Merles, Reuel Boom, MD;  [facility];  Service: Gastroenterology;;   BOTOX INJECTION N/A 03/31/2022   Procedure: BOTOX INJECTION;  Surgeon: Dolores Frame, MD;  [facility];  Service: Gastroenterology;  Laterality: N/A;   BREAST BIOPSY Right 2019   benign   BREAST ENHANCEMENT SURGERY Bilateral 1993   BREAST IMPLANT REMOVAL Bilateral    BREAST SURGERY N/A    Phreesia 10/29/2020   CARDIAC CATHETERIZATION  07-06-2003   dr Smitty Cords brodie   normal coronaries and LVF   CARDIAC ELECTROPHYSIOLOGY STUDY AND ABLATION  1996  and 1997   CARDIOVASCULAR STRESS TEST  11-18-2015   dr Graciela Husbands   normal nuclear study w/ no ischemia/  normal LV function and wall motion , ef 84%   CARPAL TUNNEL RELEASE Right ?   CESAREAN SECTION  1986   CESAREAN SECTION N/A    Phreesia 10/29/2020   CHOLECYSTECTOMY N/A    Phreesia 10/29/2020   COLON SURGERY     COLONOSCOPY WITH PROPOFOL N/A 05/02/2021   Procedure: COLONOSCOPY WITH PROPOFOL;  Surgeon: Dolores Frame, MD;  [facility];  Service: Gastroenterology;  Laterality: N/A;  12:30 PM   CYSTO/  HYDRODISTENTION/  INSTILATION THERAPY  MULTIPLE   ENTEROCUTANEOUS FISTULA CLOSURE  multiple   last one 2015 with small bowel resection   ESOPHAGOGASTRODUODENOSCOPY (EGD) WITH PROPOFOL N/A 05/02/2021   Procedure: ESOPHAGOGASTRODUODENOSCOPY (EGD) WITH PROPOFOL;  Surgeon: Dolores Frame, MD;  [facility];  Service:  Gastroenterology;  Laterality: N/A;   ESOPHAGOGASTRODUODENOSCOPY (EGD) WITH PROPOFOL N/A 12/04/2021   Procedure: ESOPHAGOGASTRODUODENOSCOPY (EGD) WITH PROPOFOL;  Surgeon: Lanelle Bal, DO;  [facility];  Service: Endoscopy;  Laterality: N/A;   ESOPHAGOGASTRODUODENOSCOPY (EGD) WITH PROPOFOL N/A 03/31/2022   Procedure: ESOPHAGOGASTRODUODENOSCOPY (EGD) WITH PROPOFOL;  Surgeon: Dolores Frame, MD;  [facility];  Service: Gastroenterology;  Laterality: N/A;  1015   EXPLORATORY LAPAROTOMY INCISIONAL VENTRAL HERNIA REPAIR / RESECTION SMALL BOWEL  11-09-2014   @[facility]    FRACTURE SURGERY N/A    Phreesia 10/29/2020   JOINT REPLACEMENT N/A    Phreesia 10/29/2020   LIPOMA EXCISION Right 09/16/2018   Procedure: EXCISION LIPOMA UPPER BACK;  Surgeon: Berna Bue, MD;  Location: [facility];  Service: General;  Laterality: Right;   MASTECTOMY Left 2006   w/ reconstruction on left (paget's disease)  and right breast augmentation   POLYPECTOMY  05/02/2021   Procedure: POLYPECTOMY;  Surgeon: Dolores Frame, MD;  [facility];  Service: Gastroenterology;;  gastric   POLYPECTOMY  03/31/2022   Procedure: POLYPECTOMY;  Surgeon: Dolores Frame, MD;  [facility];  Service: Gastroenterology;;   Gaspar Bidding DILATION  05/02/2021   Procedure: Gaspar Bidding DILATION;  Surgeon: Dolores Frame, MD;  [facility];  Service: Gastroenterology;;   SMALL INTESTINE SURGERY N/A    Phreesia 10/29/2020   SPINE SURGERY N/A    Phreesia 10/29/2020   TOTAL COLECTOMY  08-04-2002    @[facility]    AND CHOLECYSTECTOMY  (colonic inertia)   TRANSTHORACIC ECHOCARDIOGRAM  02/04/2016   ef 60-65%,  grade 2 diastolic dysfunction/  mild MR   TUBAL LIGATION N/A    Phreesia 03/16/2021   VENA CAVA FILTER PLACEMENT  05/08/2005   @[facility]    [facility] (non-retrievable)   WIDE EXCISION PERIRECTAL ABSCESSES  09-22-2005   @ [facility]    Family History   Problem Relation Age of Onset   Diabetes Mother    Heart  disease Mother    Hypertension Mother    Heart disease Father    Hyperlipidemia Father    Hypertension Father    Alcohol abuse Father    Colon cancer Maternal Aunt    Breast cancer Maternal Aunt    Cancer Maternal Uncle        mets   Bone cancer Maternal Grandfather        mets   Ovarian cancer Cousin 19   Breast cancer Cousin    Prostate cancer Maternal Uncle    Breast cancer Maternal Aunt    Brain cancer Maternal Aunt    Breast cancer Maternal Aunt     Social History:  reports that she has never smoked. She has never used smokeless tobacco. She reports that she does not drink alcohol and does not use drugs.  Allergies:  Allergies  Allergen Reactions   Nitrofurantoin Hives   Crestor [Rosuvastatin Calcium] Other (See Comments)    Generalized cramps   Bisacodyl Other (See Comments)    Makes patient feel like she is having cramps    Codeine Itching   Iron Sucrose Other (See Comments)    Flushing- required benadryl and solu medrol   Monosodium Glutamate Other (See Comments)    Cluster migraines    Scopolamine Hbr Other (See Comments)    Cluster migraines, impaired vision   Other Hives, Itching, Swelling, Rash and Other (See Comments)    Cigarette smoke    Medications: I have reviewed the patient's current medications. Prior to Admission:  Medications Prior to Admission  Medication Sig Dispense Refill Last Dose   ALPRAZolam (XANAX) 1 MG tablet Take one tablet by mouth two times daily for anxiety 60 tablet 5 04/21/2023   aspirin 81 MG chewable tablet Chew 81 mg by mouth daily.   04/21/2023   BIOTIN PO Take 1 capsule by mouth daily.   04/21/2023   butalbital-acetaminophen-caffeine (FIORICET) 50-325-40 MG tablet Take 1 tablet by mouth every 6 (six) hours as needed.   unknown   Cholecalciferol (VITAMIN D3) 1.25 MG (50000 UT) CAPS Take 1 capsule by mouth every 30 (thirty) days.   unknown   conjugated estrogens  (PREMARIN) vaginal cream INSERT 1 APPLICATORFUL VAGINALLY DAILY (Patient taking differently: Place 1 applicator vaginally at bedtime.) 30 g 2 Past Week   cyanocobalamin (VITAMIN B12) 1000 MCG/ML injection Inject 1 mL (1,000 mcg total) into the muscle every 30 (thirty) days. 1 mL 11 unknown   cyclobenzaprine (FLEXERIL) 10 MG tablet TAKE 1 TABLET BY MOUTH TWICE DAILY AS NEEDED FOR MUSCLE SPASMS (Patient taking differently: Take 10 mg by mouth 2 (two) times daily.) 180 tablet 0 04/21/2023   cycloSPORINE (RESTASIS) 0.05 % ophthalmic emulsion Place 1 drop into both eyes 2 (two) times daily as needed (dry eyes).   Past Week   DULoxetine (CYMBALTA) 60 MG capsule Take 60 mg by mouth daily.   04/21/2023   erythromycin (ERY-TAB) 250 MG EC tablet Take 1 tablet (250 mg total) by mouth 3 (three) times daily before meals. TAKE 1 TABLET BY MOUTH THREE TIMES DAILY BEFORE MEALS Strength: 250 mg 270 tablet 2 04/21/2023   ezetimibe (ZETIA) 10 MG tablet TAKE 1 TABLET(10 MG) BY MOUTH DAILY (Patient taking differently: Take 10 mg by mouth daily.) 90 tablet 3 04/21/2023   fluticasone (FLONASE) 50 MCG/ACT nasal spray Place 2 sprays into both nostrils daily as needed for allergies.   04/21/2023   HYDROmorphone (DILAUDID) 4 MG tablet Take 4 mg by mouth 3 (three) times daily.  04/22/2023   levothyroxine (SYNTHROID) 125 MCG tablet TAKE 1 TABLET(125 MCG) BY MOUTH DAILY BEFORE BREAKFAST (Patient taking differently: Take 125 mcg by mouth daily before breakfast.) 90 tablet 1 04/21/2023   lubiprostone (AMITIZA) 24 MCG capsule TAKE 1 CAPSULE(24 MCG) BY MOUTH TWICE DAILY WITH A MEAL (Patient taking differently: Take 24 mcg by mouth 2 (two) times daily with a meal.) 60 capsule 3 04/22/2023   magnesium oxide (MAG-OX) 400 (240 Mg) MG tablet TAKE 1 TABLET BY MOUTH FOUR TIMES DAILY (Patient taking differently: Take 400 mg by mouth 2 (two) times daily.) 180 tablet 0 04/21/2023   metoprolol succinate (TOPROL-XL) 25 MG 24 hr tablet Take 1 tablet (25 mg  total) by mouth daily. 45 tablet 1 04/20/2023 at AM   Multiple Vitamins-Minerals (ICAPS AREDS 2 PO) Take 1 capsule by mouth every evening.   04/21/2023   omeprazole (PRILOSEC) 40 MG capsule Take 1 capsule (40 mg total) by mouth in the morning and at bedtime. 180 capsule 3 04/21/2023   OVER THE COUNTER MEDICATION Nutra-ful   04/21/2023   phenazopyridine (PYRIDIUM) 95 MG tablet Take by mouth.   unknown   polyethylene glycol (MIRALAX / GLYCOLAX) 17 g packet Take by mouth.   unknown   potassium chloride (KLOR-CON M) 10 MEQ tablet TAKE 1 TABLET(10 MEQ) BY MOUTH DAILY (Patient taking differently: Take 10 mEq by mouth every evening.) 90 tablet 0 04/21/2023   sennosides-docusate sodium (SENOKOT-S) 8.6-50 MG tablet as needed.   04/21/2023   XARELTO 20 MG TABS tablet TAKE 1 TABLET(20 MG) BY MOUTH DAILY (Patient taking differently: Take 20 mg by mouth in the morning.) 30 tablet 1 04/21/2023 at 0930   levofloxacin (LEVAQUIN) 750 MG tablet Take 750 mg by mouth daily. (Patient not taking: Reported on 04/22/2023)   Completed Course   naloxone (NARCAN) nasal spray 4 mg/0.1 mL SMARTSIG:Both Nares (Patient not taking: Reported on 04/22/2023)   Not Taking   pravastatin (PRAVACHOL) 20 MG tablet TAKE 1 TABLET(20 MG) BY MOUTH DAILY (Patient taking differently: Take 20 mg by mouth every evening.) 90 tablet 1     Results for orders placed or performed during the hospital encounter of 04/22/23 (from the past 48 hour(s))  Lipase, blood     Status: None   Collection Time: 04/22/23  2:14 PM  Result Value Ref Range   Lipase 40 11 - 51 U/L    Comment: Performed at [facility], [address] [place], [place] [address]  Comprehensive metabolic panel     Status: Abnormal   Collection Time: 04/22/23  2:14 PM  Result Value Ref Range   Sodium 137 135 - 145 mmol/L   Potassium 4.1 3.5 - 5.1 mmol/L   Chloride 102 98 - 111 mmol/L   CO2 24 22 - 32 mmol/L   Glucose, Bld 121 (H) 70 - 99 mg/dL    Comment: Glucose reference range applies  only to samples taken after fasting for at least 8 hours.   BUN 10 8 - 23 mg/dL   Creatinine, Ser 1.91 0.44 - 1.00 mg/dL   Calcium 9.4 8.9 - 47.8 mg/dL   Total Protein 6.7 6.5 - 8.1 g/dL   Albumin 4.0 3.5 - 5.0 g/dL   AST 21 15 - 41 U/L   ALT 19 0 - 44 U/L   Alkaline Phosphatase 97 38 - 126 U/L   Total Bilirubin 0.6 0.3 - 1.2 mg/dL   GFR, Estimated >29 >56 mL/min    Comment: (NOTE) Calculated using the CKD-EPI Creatinine  Equation (2021)    Anion gap 11 5 - 15    Comment: Performed at [facility], [address], [place], [place] [address]  CBC     Status: Abnormal   Collection Time: 04/22/23  2:14 PM  Result Value Ref Range   WBC 8.6 4.0 - 10.5 K/uL   RBC 5.08 3.87 - 5.11 MIL/uL   Hemoglobin 15.3 (H) 12.0 - 15.0 g/dL   HCT 13.0 86.5 - 78.4 %   MCV 90.2 80.0 - 100.0 fL   MCH 30.1 26.0 - 34.0 pg   MCHC 33.4 30.0 - 36.0 g/dL   RDW 69.6 29.5 - 28.4 %   Platelets 232 150 - 400 K/uL   nRBC 0.0 0.0 - 0.2 %    Comment: Performed at [facility], [address], [place], [place] [address]  Lactic acid, plasma     Status: None   Collection Time: 04/22/23  2:14 PM  Result Value Ref Range   Lactic Acid, Venous 1.4 0.5 - 1.9 mmol/L    Comment: Performed at [facility], [address], [place], [place] [address]  Urinalysis, Routine w reflex microscopic -Urine, Clean Catch     Status: Abnormal   Collection Time: 04/22/23  3:41 PM  Result Value Ref Range   Color, Urine YELLOW YELLOW   APPearance HAZY (A) CLEAR   Specific Gravity, Urine 1.011 1.005 - 1.030   pH 6.0 5.0 - 8.0   Glucose, UA NEGATIVE NEGATIVE mg/dL   Hgb urine dipstick NEGATIVE NEGATIVE   Bilirubin Urine NEGATIVE NEGATIVE   Ketones, ur NEGATIVE NEGATIVE mg/dL   Protein, ur NEGATIVE NEGATIVE mg/dL   Nitrite NEGATIVE NEGATIVE   Leukocytes,Ua NEGATIVE NEGATIVE    Comment: Performed at [facility], [address], [place], [place] [address]  Lactic acid, plasma     Status: None   Collection Time: 04/22/23  3:51 PM  Result  Value Ref Range   Lactic Acid, Venous 1.2 0.5 - 1.9 mmol/L    Comment: Performed at [facility], [address] [place], [place] [address]  Comprehensive metabolic panel     Status: Abnormal   Collection Time: 04/23/23  4:24 AM  Result Value Ref Range   Sodium 138 135 - 145 mmol/L   Potassium 3.8 3.5 - 5.1 mmol/L   Chloride 104 98 - 111 mmol/L   CO2 25 22 - 32 mmol/L   Glucose, Bld 103 (H) 70 - 99 mg/dL    Comment: Glucose reference range applies only to samples taken after fasting for at least 8 hours.   BUN 15 8 - 23 mg/dL   Creatinine, Ser 7.42 0.44 - 1.00 mg/dL   Calcium 8.8 (L) 8.9 - 10.3 mg/dL   Total Protein 5.9 (L) 6.5 - 8.1 g/dL   Albumin 3.4 (L) 3.5 - 5.0 g/dL   AST 19 15 - 41 U/L   ALT 17 0 - 44 U/L   Alkaline Phosphatase 81 38 - 126 U/L   Total Bilirubin 0.7 0.3 - 1.2 mg/dL   GFR, Estimated >59 >56 mL/min    Comment: (NOTE) Calculated using the CKD-EPI Creatinine Equation (2021)    Anion gap 9 5 - 15    Comment: Performed at [facility], [address] [place], [place] [address]  Magnesium     Status: None   Collection Time: 04/23/23  4:24 AM  Result Value Ref Range   Magnesium 1.8 1.7 - 2.4 mg/dL    Comment: Performed at [facility], [address], [place], [place] [address]  CBC with Differential/Platelet     Status: None   Collection Time: 04/23/23  4:24 AM  Result Value Ref Range   WBC 9.0 4.0 - 10.5 K/uL   RBC 4.59 3.87 - 5.11 MIL/uL   Hemoglobin 13.8 12.0 - 15.0 g/dL   HCT 19.1 47.8 - 29.5 %   MCV 91.1 80.0 - 100.0 fL   MCH 30.1 26.0 - 34.0 pg   MCHC 33.0 30.0 - 36.0 g/dL   RDW 62.1 30.8 - 65.7 %   Platelets 213 150 - 400 K/uL   nRBC 0.0 0.0 - 0.2 %   Neutrophils Relative % 65 %   Neutro Abs 5.8 1.7 - 7.7 K/uL   Lymphocytes Relative 25 %   Lymphs Abs 2.2 0.7 - 4.0 K/uL   Monocytes Relative 9 %   Monocytes Absolute 0.8 0.1 - 1.0 K/uL   Eosinophils Relative 1 %   Eosinophils Absolute 0.1 0.0 - 0.5 K/uL   Basophils Relative 0 %   Basophils  Absolute 0.0 0.0 - 0.1 K/uL   Immature Granulocytes 0 %   Abs Immature Granulocytes 0.03 0.00 - 0.07 K/uL    Comment: Performed at [facility], [address] [place], [place] [address]   *Note: Due to a large number of results and/or encounters for the requested time period, some results have not been displayed. A complete set of results can be found in Results Review.    DG Abd 1 View  Result Date: 04/23/2023 CLINICAL DATA:  Small bowel obstruction. EXAM: ABDOMEN - 1 VIEW COMPARISON:  CT of the abdomen and pelvis 04/22/2023 FINDINGS: Side port of the NG tube is in the fundus the stomach. Gas-filled loops of small bowel in the left upper quadrant are partially decompressed. Gas and stool is again noted within the colon. Degenerative changes are present in the lower lumbar spine. Surgical clips are present at the gallbladder fossa and over the left abdomen. IMPRESSION: 1. Side port of the NG tube is in the fundus the stomach. 2. Partial decompression of small bowel loops in the left upper quadrant. Electronically Signed   By: Marin Roberts M.D.   On: 04/23/2023 08:19   DG Chest Port 1 View  Result Date: 04/22/2023 CLINICAL DATA:  Nasogastric tube placement. EXAM: PORTABLE CHEST 1 VIEW COMPARISON:  December 01, 2021 FINDINGS: A nasogastric tube is seen with its distal end overlying the body of the stomach. A distal side hole is approximately 6.7 cm distal to the expected region of the gastroesophageal junction. The heart size and mediastinal contours are within normal limits. Low lung volumes are noted with mild elevation of the right hemidiaphragm. Mild, diffuse, increased interstitial lung markings are noted with mild areas of linear atelectasis seen within the bilateral lung bases. There is no evidence of a pleural effusion or pneumothorax. The visualized skeletal structures are unremarkable. IMPRESSION: 1. Nasogastric tube positioning, as described above. 2. Mild bibasilar linear atelectasis.  Electronically Signed   By: Aram Candela M.D.   On: 04/22/2023 21:51   CT ABDOMEN PELVIS WO CONTRAST  Result Date: 04/22/2023 CLINICAL DATA:  Acute nonlocalized abdominal pain. Left-sided abdominal pain radiating to the right. Nausea. Previous history of bowel obstructions. EXAM: CT ABDOMEN AND PELVIS WITHOUT CONTRAST TECHNIQUE: Multidetector CT imaging of the abdomen and pelvis was performed following the standard protocol without IV contrast. RADIATION DOSE REDUCTION: This exam was performed according to the departmental dose-optimization program which includes automated exposure control, adjustment of  the mA and/or kV according to patient size and/or use of iterative reconstruction technique. COMPARISON:  06/03/2022 FINDINGS: Lower chest: Linear atelectasis in the lung bases. Right breast implant. Fatty infiltration in the region of the left breast tissue consistent with fat necrosis. This is possibly postoperative or posttraumatic. Similar appearance to previous study. Hepatobiliary: No focal liver abnormality is seen. Status post cholecystectomy. No biliary dilatation. Pancreas: Unremarkable. No pancreatic ductal dilatation or surrounding inflammatory changes. Spleen: Normal in size without focal abnormality. Adrenals/Urinary Tract: Adrenal glands are unremarkable. Kidneys are normal, without renal calculi, focal lesion, or hydronephrosis. Bladder is unremarkable. Stomach/Bowel: Stomach is distended with fluid and ingested material. Proximal small bowel are distended. Transition zone to mostly decompressed distal small bowel. There appears to be partial right hemicolectomy with ileocolonic anastomosis. Additional surgical clips throughout the abdomen. Location of obstruction may be localized to an anterior anastomosis just deep to the anterior abdominal wall. Stool-filled colon. Vascular/Lymphatic: No significant vascular findings are present. No enlarged abdominal or pelvic lymph nodes. Reproductive:  No pelvic mass. Other: No free air or free fluid in the abdomen. Scarring and deformity of the anterior abdominal wall is likely postoperative. Musculoskeletal: No acute or significant osseous findings. IMPRESSION: Changes consistent with small-bowel obstruction with transition zone likely in the anterior abdomen at the site of anastomosis. Partial colectomy with ileocolonic anastomosis and stool-filled colon. Electronically Signed   By: Burman Nieves M.D.   On: 04/22/2023 20:32    ROS:  Pertinent items are noted in HPI.  Blood pressure (!) 105/92, pulse 89, temperature 98.3 F (36.8 C), resp. rate 17, height 5\' 4"  (1.626 m), weight 84.9 kg, SpO2 97 %. Physical Exam: Pleasant but anxious white female in no acute distress Head is normocephalic, atraumatic Lungs clear to auscultation with equal breath sounds bilaterally Abdomen is soft with multiple surgical scars and probable hernia defects.  Minimal bowel sounds appreciated.  No peritoneal signs noted.  CT scan images personally reviewed Assessment/Plan: Impression: Partial small bowel obstruction versus bowel dysmotility with gastroparesis.  This could be due to to her adhesive disease which given her multiple surgical interventions could be extensive.  She has been told in the past that the [facility] surgeons would only operate if absolutely necessary.  I think at this point I will get a small bowel obstruction protocol study to assess whether or not she has an obstruction due to adhesive disease/anastomotic stricture.  Should she have an obstruction, she would need to be transferred to [facility] general surgery for further evaluation and treatment.  I will also give her an enema as she does have a stool burden in the distal colon.  Would maximize her hydration and correct any abnormalities of her electrolytes.  Will follow with you.  Franky Macho 04/23/2023, 10:58 AM

## 2023-04-23 NOTE — H&P (Signed)
History and Physical    Patient: [name] ZOX:[id number] DOB: 1961/05/04 DOA: 04/22/2023 DOS: the patient was seen and examined on 04/23/2023 PCP: Kerri Perches, MD  Patient coming from: Home  Chief Complaint:  Chief Complaint  Patient presents with   Abdominal Pain   HPI: [name] is a 62 y.o. female with medical history significant of abdominal hernia, partial colectomy, chronic pain, GERD, hyperlipidemia, paroxysmal SVT currently undergoing workup with Zio patch, hypothyroidism, anxiety, and more presents the ED with a chief complaint of abdominal pain.  Patient reports that she has had 24 hours of abdominal cramping prior to arrival.  She has a history of gastroparesis at first she thought it was that.  Patient reports she has had multiple bowel surgeries and multiple SBO's in the past.  Her last SBO was December 2022.  The pain then became sharp and stabbing which is different than her gastroparesis pain.  She figured it was another SBO.  It was in the left and radiating to the right and hurting through her back.  She described the pain is waxing and waning like contractions.  She reports that the pain was becoming progressively worse and finally she had to come into the ER because it was too much to a stand.  She had a normal meal at 5 PM on the 24th.  Her last  bowel movement was also around that time on the 24th.  She reports it was loose and a small amount.  She felt urges to evacuate her bowels all day on the 25th but was not able to do it.  She had no flatulence on the 25th.  She took 2 Amitiza to try to move her bowels, but it did not help.  She had some nausea with small amounts of emesis.  Patient has no other complaints at this time.  Patient does not smoke, does not drink, does not use illicit drugs.  She is vaccinated for COVID and flu.  Patient would like to be full code. Review of Systems: As mentioned in the history of present illness. All other  systems reviewed and are negative. Past Medical History:  Diagnosis Date   Abdominal hernia 08/14/2019   Abdominal pain 06/06/2019   Acute bronchitis 05/01/2013   Acute cystitis 06/09/2010   Qualifier: Diagnosis of  By: Lillia Mountain LPN, Brandi     Acute renal insufficiency 08/28/2011   Allergy    Phreesia 03/16/2021   Anemia    Phreesia 10/29/2020   Anxiety    Anxiety state 03/16/2008   Qualifier: Diagnosis of  By: Lind Guest     Arthritis    Phreesia 10/29/2020   Back pain with radiation 07/17/2014   Bilateral hand pain 07/27/2016   Blood transfusion without reported diagnosis    Phreesia 10/29/2020   Breast cancer (HCC)    L breast- 2006   Cancer (HCC)    Phreesia 10/29/2020   Carpal tunnel syndrome    Chronic constipation    Chronic pain syndrome    followed by [facility]---  back   Clotting disorder (HCC)    Phreesia 10/29/2020   Depression    Dermatitis 12/15/2011   Difficult intubation    Educated about COVID-19 virus infection 05/14/2019   Fall at home 01/26/2016   Family history of adverse reaction to anesthesia    MOTHER--- PONV   Fatigue 09/17/2012   Fibromyalgia    FIBROMYALGIA 03/16/2008   Qualifier: Diagnosis of  By: Lind Guest  GERD (gastroesophageal reflux disease)    Headache disorder 01/16/2013   Headache syndrome 01/26/2016   Hip pain, right 07/08/2019   History of MRSA infection    lip abscess   History of ovarian cyst 06/2011   s/p  BSO   History of pulmonary embolus (PE) 1997   post EP with ablation pulmonary veouns for SVT/ Atrial Fib.   History of supraventricular tachycardia    s/p  ablation 1996  and 1997  by dr Graciela Husbands   History of TIA (transient ischemic attack) 1997   post op EP ablation PE   Hyperlipidemia    Hyperlipidemia LDL goal <100 03/16/2008   Qualifier: Diagnosis of  By: Lind Guest     Hypothyroidism    followed by pcp   Incisional hernia    Insomnia 12/15/2011   Intermittent palpitations 08/22/2017   Interstitial cystitis     09-13-2018   per pt last flare-up  May 2019 (followed by pcp)   INTERSTITIAL CYSTITIS 02/17/2011   Qualifier: Diagnosis of  By: Lodema Hong MD, Margaret     Iron deficiency anemia    09-13-2018  PER PT STABLE   Irregular heart rate 07/25/2013   Lipoma of back    upper   Low back pain radiating to right leg 07/04/2019   Low ferritin level 06/14/2014   MDD (major depressive disorder), single episode, in full remission (HCC) 10/27/2017   Medically noncompliant 02/28/2015   Multiple missed appointments, both follow-up appointments and lab appointments.    Metabolic syndrome X 02/10/2011   Qualifier: Diagnosis of  By: Lodema Hong MD, Margaret  hBA1c is 5.8 in 02/2013    Migraines    Morbid obesity (HCC) 03/27/2013   Muscle spasm 01/03/2020   Nausea alone 07/17/2014   NECK PAIN, CHRONIC 03/16/2008   Qualifier: Diagnosis of  By: Lind Guest     Normal coronary arteries    a. by CT 12/2018.   Obesity 03/16/2008   Qualifier: Diagnosis of  By: Lind Guest     Oral ulceration 08/23/2014   Presented at 06/14/2014 visit    Other malaise and fatigue 03/16/2008   Centricity Description: FATIGUE, CHRONIC Qualifier: Diagnosis of  By: Lind Guest   Centricity Description: FATIGUE Qualifier: Diagnosis of  By: Garnette Czech PA, Dawn     OVARIAN CYST 12/26/2009   Qualifier: Diagnosis of  By: Lodema Hong MD, Margaret     Paget disease of breast, left [facility])    Paget's disease of breast, left (HCC) 03/16/2008   Qualifier: Diagnosis of  By: Lind Guest  Left diagnosed in 2006 F/h breast cancer x 15 family members   Partial small bowel obstruction (HCC) 07/04/2012   PONV (postoperative nausea and vomiting)    SEVERE   Postsurgical menopause 11/13/2011   Presence of IVC filter 06/06/2019   PSVT (paroxysmal supraventricular tachycardia)    HX ABLATION 1996 AND 1997   Recurrent oral herpes simplex infection 10/13/2017   ROM (right otitis media) 01/23/2013   S/P insertion of IVC (inferior vena caval) filter 05/08/2005    greenfield (non-retrievable)  /  dx 2019  a leg of filter is protruding thru the vena cava in to right L2 vertebral body (09-13-2018  per pt having surgery to remove filter in [place])   S/P radiofrequency ablation operation for arrhythmia 1996   1996  and 1997,   SVT and Atrial Fib   Sinusitis, chronic 01/26/2016   SMALL BOWEL OBSTRUCTION, HX OF 08/07/2008   Annotation: obstruction w/ adhesions led to partial colectomy  Qualifier: Diagnosis of  By: Minna Merritts     Stroke [facility])    Phreesia 10/29/2020, TIAs in the past   Swelling of hand 09/17/2012   Syncope    Tachycardia 02/03/2010   Qualifier: Diagnosis of  By: Via LPN, Lynn     Thyroid disease    Phreesia 10/29/2020   Ulcer 12/15/2011   Urinary frequency 07/18/2014   Vaginitis and vulvovaginitis 05/10/2013   Vitamin D deficiency 05/05/2015   Wears glasses    Past Surgical History:  Procedure Laterality Date   ABDOMINAL HYSTERECTOMY  1987   ANTERIOR CERVICAL DECOMP/DISCECTOMY FUSION  03-07-2002   dr elsner  @[facility]    C 4 -- 5   APPENDECTOMY  1980   AUGMENTATION MAMMAPLASTY Right 2006   BILATERAL SALPINGOOPHORECTOMY  07/24/2011   via Explor. Lap. w/ intraoperative perf. bowel repair   BIOPSY  05/02/2021   Procedure: BIOPSY;  Surgeon: Marguerita Merles, Reuel Boom, MD;  [facility];  Service: Gastroenterology;;  small bowel, mid esophagus, distal esophagus, random colon biopsies   BIOPSY  12/04/2021   Procedure: BIOPSY;  Surgeon: Lanelle Bal, DO;  [facility];  Service: Endoscopy;;   BIOPSY  03/31/2022   Procedure: BIOPSY;  Surgeon: Dolores Frame, MD;  [facility];  Service: Gastroenterology;;   BOTOX INJECTION N/A 03/31/2022   Procedure: BOTOX INJECTION;  Surgeon: Dolores Frame, MD;  [facility];  Service: Gastroenterology;  Laterality: N/A;   BREAST BIOPSY Right 2019   benign   BREAST ENHANCEMENT SURGERY Bilateral 1993   BREAST IMPLANT REMOVAL Bilateral    BREAST  SURGERY N/A    Phreesia 10/29/2020   CARDIAC CATHETERIZATION  07-06-2003   dr Smitty Cords brodie   normal coronaries and LVF   CARDIAC ELECTROPHYSIOLOGY STUDY AND ABLATION  1996  and 1997   CARDIOVASCULAR STRESS TEST  11-18-2015   dr Graciela Husbands   normal nuclear study w/ no ischemia/  normal LV function and wall motion , ef 84%   CARPAL TUNNEL RELEASE Right ?   CESAREAN SECTION  1986   CESAREAN SECTION N/A    Phreesia 10/29/2020   CHOLECYSTECTOMY N/A    Phreesia 10/29/2020   COLON SURGERY     COLONOSCOPY WITH PROPOFOL N/A 05/02/2021   Procedure: COLONOSCOPY WITH PROPOFOL;  Surgeon: Dolores Frame, MD;  [facility];  Service: Gastroenterology;  Laterality: N/A;  12:30 PM   CYSTO/  HYDRODISTENTION/  INSTILATION THERAPY  MULTIPLE   ENTEROCUTANEOUS FISTULA CLOSURE  multiple   last one 2015 with small bowel resection   ESOPHAGOGASTRODUODENOSCOPY (EGD) WITH PROPOFOL N/A 05/02/2021   Procedure: ESOPHAGOGASTRODUODENOSCOPY (EGD) WITH PROPOFOL;  Surgeon: Dolores Frame, MD;  [facility];  Service: Gastroenterology;  Laterality: N/A;   ESOPHAGOGASTRODUODENOSCOPY (EGD) WITH PROPOFOL N/A 12/04/2021   Procedure: ESOPHAGOGASTRODUODENOSCOPY (EGD) WITH PROPOFOL;  Surgeon: Lanelle Bal, DO;  [facility];  Service: Endoscopy;  Laterality: N/A;   ESOPHAGOGASTRODUODENOSCOPY (EGD) WITH PROPOFOL N/A 03/31/2022   Procedure: ESOPHAGOGASTRODUODENOSCOPY (EGD) WITH PROPOFOL;  Surgeon: Dolores Frame, MD;  [facility];  Service: Gastroenterology;  Laterality: N/A;  1015   EXPLORATORY LAPAROTOMY INCISIONAL VENTRAL HERNIA REPAIR / RESECTION SMALL BOWEL  11-09-2014   @[facility]    FRACTURE SURGERY N/A    Phreesia 10/29/2020   JOINT REPLACEMENT N/A    Phreesia 10/29/2020   LIPOMA EXCISION Right 09/16/2018   Procedure: EXCISION LIPOMA UPPER BACK;  Surgeon: Berna Bue, MD;  Location: [facility];  Service: General;  Laterality: Right;    MASTECTOMY Left 2006   w/ reconstruction on left (paget's disease)  and right breast augmentation   POLYPECTOMY  05/02/2021   Procedure: POLYPECTOMY;  Surgeon: Dolores Frame, MD;  [facility];  Service: Gastroenterology;;  gastric   POLYPECTOMY  03/31/2022   Procedure: POLYPECTOMY;  Surgeon: Dolores Frame, MD;  [facility];  Service: Gastroenterology;;   Gaspar Bidding DILATION  05/02/2021   Procedure: Gaspar Bidding DILATION;  Surgeon: Dolores Frame, MD;  [facility];  Service: Gastroenterology;;   SMALL INTESTINE SURGERY N/A    Phreesia 10/29/2020   SPINE SURGERY N/A    Phreesia 10/29/2020   TOTAL COLECTOMY  08-04-2002    @[facility]    AND CHOLECYSTECTOMY  (colonic inertia)   TRANSTHORACIC ECHOCARDIOGRAM  02/04/2016   ef 60-65%,  grade 2 diastolic dysfunction/  mild MR   TUBAL LIGATION N/A    Phreesia 03/16/2021   VENA CAVA FILTER PLACEMENT  05/08/2005   @[facility]    [facility] (non-retrievable)   WIDE EXCISION PERIRECTAL ABSCESSES  09-22-2005   @ [facility]   Social History:  reports that she has never smoked. She has never used smokeless tobacco. She reports that she does not drink alcohol and does not use drugs.  Allergies  Allergen Reactions   Nitrofurantoin Hives   Crestor [Rosuvastatin Calcium] Other (See Comments)    Generalized cramps   Bisacodyl Other (See Comments)    Makes patient feel like she is having cramps    Codeine Itching   Iron Sucrose Other (See Comments)    Flushing- required benadryl and solu medrol   Monosodium Glutamate Other (See Comments)    Cluster migraines    Scopolamine Hbr Other (See Comments)    Cluster migraines, impaired vision   Other Hives, Itching, Swelling, Rash and Other (See Comments)    Cigarette smoke    Family History  Problem Relation Age of Onset   Diabetes Mother    Heart disease Mother    Hypertension Mother    Heart disease Father    Hyperlipidemia Father    Hypertension Father     Alcohol abuse Father    Colon cancer Maternal Aunt    Breast cancer Maternal Aunt    Cancer Maternal Uncle        mets   Bone cancer Maternal Grandfather        mets   Ovarian cancer Cousin 19   Breast cancer Cousin    Prostate cancer Maternal Uncle    Breast cancer Maternal Aunt    Brain cancer Maternal Aunt    Breast cancer Maternal Aunt     Prior to Admission medications   Medication Sig Start Date End Date Taking? Authorizing Provider  ALPRAZolam Prudy Feeler) 1 MG tablet Take one tablet by mouth two times daily for anxiety 02/10/23  Yes Kerri Perches, MD  aspirin 81 MG chewable tablet Chew 81 mg by mouth daily.   Yes [provider]  BIOTIN PO Take 1 capsule by mouth daily.   Yes [provider]  butalbital-acetaminophen-caffeine (FIORICET) 50-325-40 MG tablet Take 1 tablet by mouth every 6 (six) hours as needed. 04/11/22  Yes [provider]  Cholecalciferol (VITAMIN D3) 1.25 MG (50000 UT) CAPS Take 1 capsule by mouth every 30 (thirty) days.   Yes [provider]  conjugated estrogens (PREMARIN) vaginal cream INSERT 1 APPLICATORFUL VAGINALLY DAILY Patient taking differently: Place 1 applicator vaginally at bedtime. 12/11/22  Yes  Kerri Perches, MD  cyanocobalamin (VITAMIN B12) 1000 MCG/ML injection Inject 1 mL (1,000 mcg total) into the muscle every 30 (thirty) days. 11/16/22  Yes Pennington, Rebekah M, PA-C  cyclobenzaprine (FLEXERIL) 10 MG tablet TAKE 1 TABLET BY MOUTH TWICE DAILY AS NEEDED FOR MUSCLE SPASMS Patient taking differently: Take 10 mg by mouth 2 (two) times daily. 02/21/20  Yes Kerri Perches, MD  cycloSPORINE (RESTASIS) 0.05 % ophthalmic emulsion Place 1 drop into both eyes 2 (two) times daily as needed (dry eyes).   Yes [provider]  DULoxetine (CYMBALTA) 60 MG capsule Take 60 mg by mouth daily. 02/09/22  Yes [provider]  erythromycin (ERY-TAB) 250 MG EC tablet Take 1 tablet (250 mg total) by mouth 3  (three) times daily before meals. TAKE 1 TABLET BY MOUTH THREE TIMES DAILY BEFORE MEALS Strength: 250 mg 07/18/22  Yes Dolores Frame, MD  ezetimibe (ZETIA) 10 MG tablet TAKE 1 TABLET(10 MG) BY MOUTH DAILY Patient taking differently: Take 10 mg by mouth daily. 10/01/22  Yes Kerri Perches, MD  fluticasone (FLONASE) 50 MCG/ACT nasal spray Place 2 sprays into both nostrils daily as needed for allergies. 12/05/21  Yes Johnson, Clanford L, MD  HYDROmorphone (DILAUDID) 4 MG tablet Take 4 mg by mouth 3 (three) times daily.   Yes [provider]  levothyroxine (SYNTHROID) 125 MCG tablet TAKE 1 TABLET(125 MCG) BY MOUTH DAILY BEFORE BREAKFAST Patient taking differently: Take 125 mcg by mouth daily before breakfast. 12/04/22  Yes Kerri Perches, MD  lubiprostone (AMITIZA) 24 MCG capsule TAKE 1 CAPSULE(24 MCG) BY MOUTH TWICE DAILY WITH A MEAL Patient taking differently: Take 24 mcg by mouth 2 (two) times daily with a meal. 03/03/23  Yes Carlan, Chelsea L, NP  magnesium oxide (MAG-OX) 400 (240 Mg) MG tablet TAKE 1 TABLET BY MOUTH FOUR TIMES DAILY Patient taking differently: Take 400 mg by mouth 2 (two) times daily. 03/25/23  Yes Kerri Perches, MD  metoprolol succinate (TOPROL-XL) 25 MG 24 hr tablet Take 1 tablet (25 mg total) by mouth daily. 04/15/23  Yes Chandrasekhar, Mahesh A, MD  Multiple Vitamins-Minerals (ICAPS AREDS 2 PO) Take 1 capsule by mouth every evening.   Yes [provider]  omeprazole (PRILOSEC) 40 MG capsule Take 1 capsule (40 mg total) by mouth in the morning and at bedtime. 11/18/21  Yes Erick Blinks, MD  OVER THE COUNTER MEDICATION Nutra-ful   Yes [provider]  phenazopyridine (PYRIDIUM) 95 MG tablet Take by mouth.   Yes [provider]  polyethylene glycol (MIRALAX / GLYCOLAX) 17 g packet Take by mouth.   Yes [provider]  potassium chloride (KLOR-CON M) 10 MEQ tablet TAKE 1 TABLET(10 MEQ) BY MOUTH DAILY Patient  taking differently: Take 10 mEq by mouth every evening. 03/15/23  Yes Kerri Perches, MD  sennosides-docusate sodium (SENOKOT-S) 8.6-50 MG tablet as needed.   Yes [provider]  XARELTO 20 MG TABS tablet TAKE 1 TABLET(20 MG) BY MOUTH DAILY Patient taking differently: Take 20 mg by mouth in the morning. 03/24/23  Yes Kerri Perches, MD  levofloxacin (LEVAQUIN) 750 MG tablet Take 750 mg by mouth daily. Patient not taking: Reported on 04/22/2023 12/04/22   [provider]  naloxone [facility]) nasal spray 4 mg/0.1 mL SMARTSIG:Both Nares Patient not taking: Reported on 04/22/2023 01/15/23   [provider]  pravastatin (PRAVACHOL) 20 MG tablet TAKE 1 TABLET(20 MG) BY MOUTH DAILY Patient taking differently: Take 20 mg by mouth  every evening. 11/04/22   Kerri Perches, MD    Physical Exam: Vitals:   04/22/23 2106 04/22/23 2302 04/22/23 2323 04/23/23 0216  BP: 109/76 102/61  94/68  Pulse: 81 93  88  Resp: 17 19  17   Temp: 98.1 F (36.7 C) 98.4 F (36.9 C)  98.2 F (36.8 C)  TempSrc: Oral Oral  Oral  SpO2: 96% 93%  95%  Weight:   84.9 kg   Height:       1.  General: Patient lying supine in bed,  no acute distress   2. Psychiatric: Alert and oriented x 3, mood and behavior normal for situation, pleasant and cooperative with exam   3. Neurologic: Speech and language are normal, face is symmetric, moves all 4 extremities voluntarily, at baseline without acute deficits on limited exam   4. HEENMT:  Head is atraumatic, normocephalic, pupils reactive to light, neck is supple, trachea is midline, mucous membranes are moist   5. Respiratory : Lungs are clear to auscultation bilaterally without wheezing, rhonchi, rales, no cyanosis, no increase in work of breathing or accessory muscle use   6. Cardiovascular : Heart rate normal, rhythm is regular, no murmurs, rubs or gallops, no peripheral edema, peripheral pulses palpated   7. Gastrointestinal:  Abdomen  is soft, nondistended, tender to palpation, NG tube in place, no masses or organomegaly palpated   8. Skin:  Skin is warm, dry and intact without rashes, acute lesions, or ulcers on limited exam   9.Musculoskeletal:  No acute deformities or trauma, no asymmetry in tone, no peripheral edema, peripheral pulses palpated, no tenderness to palpation in the extremities  Data Reviewed: In the ED Temp 98, heart rate 88-95, respiratory rate 18, blood pressure 94/69-939/104, satting at 95% No leukocytosis with white blood cell count of 8.6, hemoglobin 15.3, platelets 232 Chemistry unremarkable Lactic acid normal x 2 UA unremarkable CT shows changes consistent with SBO and transition zone at previous anastomosis NG tube inserted Admission requested for further management of SBO Assessment and Plan: * SBO (small bowel obstruction) (HCC) - N.p.o. - NG tube in place - Monitor output - KUB in the a.m. - Consider general surgery consult - Pain control - IV hydration - Continue to monitor  Atrial fibrillation, chronic (HCC) - Holding p.o. metoprolol and Xarelto until patient is able to tolerate p.o. - As needed IV metoprolol - Full dose Lovenox - Zio patch in place - Currently asymptomatic - Continue to monitor  Anxiety state - Resume Xanax when patient is able to take p.o -will add as needed Ativan if patient becomes symptomatic with her anxiety prior to being able to tolerate p.o.  Hyperlipidemia - Resume Zetia and statin when patient is able to tolerate p.o.  Hypothyroidism - Resume Synthroid when patient is able to tolerate p.o.      Advance Care Planning:   Code Status: Full Code  Consults: Will likely need general surgery consult  Family Communication: Husband at bedside  Severity of Illness: The appropriate patient status for this patient is INPATIENT. Inpatient status is judged to be reasonable and necessary in order to provide the required intensity of service to  ensure the patient's safety. [name] presenting symptoms, physical exam findings, and initial radiographic and laboratory data in the context of their chronic comorbidities is felt to place them at high risk for further clinical deterioration. Furthermore, it is not anticipated that the patient will be medically stable for discharge from the hospital within 2 midnights of  admission.   * I certify that at the point of admission it is my clinical judgment that the patient will require inpatient hospital care spanning beyond 2 midnights from the point of admission due to high intensity of service, high risk for further deterioration and high frequency of surveillance required.*  Author: Lilyan Gilford, DO 04/23/2023 4:20 AM  For on call review [email or web address].

## 2023-04-23 NOTE — Assessment & Plan Note (Addendum)
-   Reporting of gas and bowel movement yesterday -Anticipating starting clear liquid diet by general surgery today -Status post IV fluid hydration and tolerating p.o. will discontinue today  -General surgery Dr. Lovell Sheehan consulted, initiating laxative, small bowel follow-through studies Which reported contrast in colon - NG tube in place-clamped - Monitor output  -Continue with IV analgesic for pain control  - Continue to monitor -Encourage ambulation

## 2023-04-23 NOTE — Assessment & Plan Note (Signed)
-   Resume Synthroid when patient is able to tolerate p.o.

## 2023-04-23 NOTE — Progress Notes (Signed)
Got message that patient is having migraine headaches.  Migraine cocktail ordered with Toradol, Compazine and Benadryl.

## 2023-04-23 NOTE — Assessment & Plan Note (Addendum)
-  Seems a bit anxious - Resume Xanax when patient is able to take p.o - As needed IV Ativan

## 2023-04-23 NOTE — Progress Notes (Addendum)
ANTICOAGULATION CONSULT NOTE - Initial Consult  Pharmacy Consult for Enoxaparin Indication: atrial fibrillation  Allergies  Allergen Reactions   Nitrofurantoin Hives   Crestor [Rosuvastatin Calcium] Other (See Comments)    Generalized cramps   Bisacodyl Other (See Comments)    Makes patient feel like she is having cramps    Codeine Itching   Iron Sucrose Other (See Comments)    Flushing- required benadryl and solu medrol   Monosodium Glutamate Other (See Comments)    Cluster migraines    Scopolamine Hbr Other (See Comments)    Cluster migraines, impaired vision   Other Hives, Itching, Swelling, Rash and Other (See Comments)    Cigarette smoke    Patient Measurements: Height: 5\' 4"  (162.6 cm) Weight: 84.9 kg (187 lb 2.7 oz) IBW/kg (Calculated) : 54.7  Vital Signs: Temp: 98.2 F (36.8 C) (04/26 0216) Temp Source: Oral (04/26 0216) BP: 94/68 (04/26 0216) Pulse Rate: 88 (04/26 0216)  Labs: Recent Labs    04/22/23 1414  HGB 15.3*  HCT 45.8  PLT 232  CREATININE 0.80    Estimated Creatinine Clearance: 77.9 mL/min (by C-G formula based on SCr of 0.8 mg/dL).   Medical History: Past Medical History:  Diagnosis Date   Abdominal hernia 08/14/2019   Abdominal pain 06/06/2019   Acute bronchitis 05/01/2013   Acute cystitis 06/09/2010   Qualifier: Diagnosis of  By: Lillia Mountain LPN, Brandi     Acute renal insufficiency 08/28/2011   Allergy    Phreesia 03/16/2021   Anemia    Phreesia 10/29/2020   Anxiety    Anxiety state 03/16/2008   Qualifier: Diagnosis of  By: Lind Guest     Arthritis    Phreesia 10/29/2020   Back pain with radiation 07/17/2014   Bilateral hand pain 07/27/2016   Blood transfusion without reported diagnosis    Phreesia 10/29/2020   Breast cancer (HCC)    L breast- 2006   Cancer (HCC)    Phreesia 10/29/2020   Carpal tunnel syndrome    Chronic constipation    Chronic pain syndrome    followed by [facility]---  back   Clotting disorder (HCC)     Phreesia 10/29/2020   Depression    Dermatitis 12/15/2011   Difficult intubation    Educated about COVID-19 virus infection 05/14/2019   Fall at home 01/26/2016   Family history of adverse reaction to anesthesia    MOTHER--- PONV   Fatigue 09/17/2012   Fibromyalgia    FIBROMYALGIA 03/16/2008   Qualifier: Diagnosis of  By: Lind Guest     GERD (gastroesophageal reflux disease)    Headache disorder 01/16/2013   Headache syndrome 01/26/2016   Hip pain, right 07/08/2019   History of MRSA infection    lip abscess   History of ovarian cyst 06/2011   s/p  BSO   History of pulmonary embolus (PE) 1997   post EP with ablation pulmonary veouns for SVT/ Atrial Fib.   History of supraventricular tachycardia    s/p  ablation 1996  and 1997  by dr Graciela Husbands   History of TIA (transient ischemic attack) 1997   post op EP ablation PE   Hyperlipidemia    Hyperlipidemia LDL goal <100 03/16/2008   Qualifier: Diagnosis of  By: Lind Guest     Hypothyroidism    followed by pcp   Incisional hernia    Insomnia 12/15/2011   Intermittent palpitations 08/22/2017   Interstitial cystitis    09-13-2018   per pt last flare-up  May 2019 (followed by pcp)   INTERSTITIAL CYSTITIS 02/17/2011   Qualifier: Diagnosis of  By: Lodema Hong MD, Margaret     Iron deficiency anemia    09-13-2018  PER PT STABLE   Irregular heart rate 07/25/2013   Lipoma of back    upper   Low back pain radiating to right leg 07/04/2019   Low ferritin level 06/14/2014   MDD (major depressive disorder), single episode, in full remission (HCC) 10/27/2017   Medically noncompliant 02/28/2015   Multiple missed appointments, both follow-up appointments and lab appointments.    Metabolic syndrome X 02/10/2011   Qualifier: Diagnosis of  By: Lodema Hong MD, Margaret  hBA1c is 5.8 in 02/2013    Migraines    Morbid obesity (HCC) 03/27/2013   Muscle spasm 01/03/2020   Nausea alone 07/17/2014   NECK PAIN, CHRONIC 03/16/2008   Qualifier: Diagnosis of  By: Lind Guest     Normal coronary arteries    a. by CT 12/2018.   Obesity 03/16/2008   Qualifier: Diagnosis of  By: Lind Guest     Oral ulceration 08/23/2014   Presented at 06/14/2014 visit    Other malaise and fatigue 03/16/2008   Centricity Description: FATIGUE, CHRONIC Qualifier: Diagnosis of  By: Lind Guest   Centricity Description: FATIGUE Qualifier: Diagnosis of  By: Garnette Czech PA, Dawn     OVARIAN CYST 12/26/2009   Qualifier: Diagnosis of  By: Lodema Hong MD, Margaret     Paget disease of breast, left [facility])    Paget's disease of breast, left (HCC) 03/16/2008   Qualifier: Diagnosis of  By: Lind Guest  Left diagnosed in 2006 F/h breast cancer x 15 family members   Partial small bowel obstruction (HCC) 07/04/2012   PONV (postoperative nausea and vomiting)    SEVERE   Postsurgical menopause 11/13/2011   Presence of IVC filter 06/06/2019   PSVT (paroxysmal supraventricular tachycardia)    HX ABLATION 1996 AND 1997   Recurrent oral herpes simplex infection 10/13/2017   ROM (right otitis media) 01/23/2013   S/P insertion of IVC (inferior vena caval) filter 05/08/2005   [place] (non-retrievable)  /  dx 2019  a leg of filter is protruding thru the vena cava in to right L2 vertebral body (09-13-2018  per pt having surgery to remove filter in [place])   S/P radiofrequency ablation operation for arrhythmia 1996   1996  and 1997,   SVT and Atrial Fib   Sinusitis, chronic 01/26/2016   SMALL BOWEL OBSTRUCTION, HX OF 08/07/2008   Annotation: obstruction w/ adhesions led to partial colectomy Qualifier: Diagnosis of  By: Minna Merritts     Stroke [facility])    Phreesia 10/29/2020, TIAs in the past   Swelling of hand 09/17/2012   Syncope    Tachycardia 02/03/2010   Qualifier: Diagnosis of  By: Via LPN, Lynn     Thyroid disease    Phreesia 10/29/2020   Ulcer 12/15/2011   Urinary frequency 07/18/2014   Vaginitis and vulvovaginitis 05/10/2013   Vitamin D deficiency 05/05/2015   Wears glasses      Assessment: [name] is a 62 yo female who was admitted on 4/25 with a small bowel obstruction. Patient was taking Xarelto 20 mg in the morning PTA, Last dose 4/24 at 0930. Transitioning to Lovenox due to patient being NPO.  HgB 15.3 on admission.   Goal of Therapy:  Monitor platelets by anticoagulation protocol: Yes   Plan:  Enoxaparin 85 mg [place] every 12 hours  Daily CBC Monitor  for s/sx of bleeding   Diona Browner 04/23/2023,4:47 AM

## 2023-04-23 NOTE — Assessment & Plan Note (Addendum)
-   Holding p.o. metoprolol and Xarelto until patient is able to tolerate p.o. - As needed IV metoprolol - Full dose Lovenox for now >>> anticipating switching to Xarelto back today - Zio patch in place - Currently asymptomatic - Continue to monitor

## 2023-04-23 NOTE — Hospital Course (Signed)
[name] is a 62 y.o. female with medical history significant of abdominal hernia, partial colectomy, chronic pain, GERD, hyperlipidemia, paroxysmal SVT currently undergoing workup with Zio patch, hypothyroidism, anxiety, and more presents the ED with a chief complaint of abdominal pain.   Patient reports that she has had 24 hours of abdominal cramping prior to arrival.  She has a history of gastroparesis at first she thought it was that.  Patient reports she has had multiple bowel surgeries and multiple SBO's in the past.  Her last SBO was December 2022.  The pain then became sharp and stabbing which is different than her gastroparesis pain.  She figured it was another SBO.  It was in the left and radiating to the right and hurting through her back.  She described the pain is waxing and waning like contractions.  She reports that the pain was becoming progressively worse and finally she had to come into the ER because it was too much to a stand.  She had a normal meal at 5 PM on the 24th.  Her last  bowel movement was also around that time on the 24th.  She reports it was loose and a small amount.  She felt urges to evacuate her bowels all day on the 25th but was not able to do it.  She had no flatulence on the 25th.  She took 2 Amitiza to try to move her bowels, but it did not help.  She had some nausea with small amounts of emesis.  Patient has no other complaints at this time.   Patient does not smoke, does not drink, does not use illicit drugs.  She is vaccinated for COVID and flu.  Patient would like to be full code.

## 2023-04-23 NOTE — Progress Notes (Signed)
PROGRESS NOTE    Patient: [name]                            PCP: Kerri Perches, MD                    DOB: 1961/10/09            DOA: 04/22/2023 ZOX:[id number]             DOS: 04/23/2023, 1:09 PM   LOS: 1 day   Date of Service: The patient was seen and examined on 04/23/2023  Subjective:   The patient was seen and examined this morning. Hemodynamically stable. NG-tube in place, complaining of abdominal pain improved with analgesics Reporting improved nausea no vomiting  Brief Narrative:   [name] is a 62 y.o. female with medical history significant of abdominal hernia, partial colectomy, chronic pain, GERD, hyperlipidemia, paroxysmal SVT currently undergoing workup with Zio patch, hypothyroidism, anxiety, and more presents the ED with a chief complaint of abdominal pain.   Patient reports that she has had 24 hours of abdominal cramping prior to arrival.  She has a history of gastroparesis at first she thought it was that.  Patient reports she has had multiple bowel surgeries and multiple SBO's in the past.  Her last SBO was December 2022.  The pain then became sharp and stabbing which is different than her gastroparesis pain.  She figured it was another SBO.  It was in the left and radiating to the right and hurting through her back.  She described the pain is waxing and waning like contractions.  She reports that the pain was becoming progressively worse and finally she had to come into the ER because it was too much to a stand.  She had a normal meal at 5 PM on the 24th.  Her last  bowel movement was also around that time on the 24th.  She reports it was loose and a small amount.  She felt urges to evacuate her bowels all day on the 25th but was not able to do it.  She had no flatulence on the 25th.  She took 2 Amitiza to try to move her bowels, but it did not help.  She had some nausea with small amounts of emesis.  Patient has no other complaints at this  time.   Patient does not smoke, does not drink, does not use illicit drugs.  She is vaccinated for COVID and flu.  Patient would like to be full code.     Assessment & Plan:   Principal Problem:   SBO (small bowel obstruction) (HCC) Active Problems:   GERD (gastroesophageal reflux disease)   Hypothyroidism   Hyperlipidemia   Anxiety state   Abdominal pain   Atrial fibrillation, chronic (HCC)     Assessment and Plan: * SBO (small bowel obstruction) (HCC) - N.p.o. -Continue IV fluids -General surgery Dr. Lovell Sheehan consulted, initiating laxative, small bowel follow-through studies - NG tube in place-clamped - Monitor output  -Continue with IV analgesic for pain control - IV hydration - Continue to monitor -Encourage ambulation  GERD (gastroesophageal reflux disease) - Continue PPI  Atrial fibrillation, chronic (HCC) - Holding p.o. metoprolol and Xarelto until patient is able to tolerate p.o. - As needed IV metoprolol - Full dose Lovenox for now  - Zio patch in place - Currently asymptomatic - Continue to monitor  Abdominal pain -Due to  SBO, continue as needed IV analgesics  Anxiety state -Seems a bit anxious - Resume Xanax when patient is able to take p.o - As needed IV Ativan   Hyperlipidemia - Resume Zetia and statin when patient is able to tolerate p.o.  Hypothyroidism - Resume Synthroid when patient is able to tolerate p.o.    ---------------------------------------------------------------------------------------------------------------------------- Nutritional status:  [name] BMI is: Body mass index is 32.13 kg/m. I agree with the assessment and plan as outlined --------------------------------------------------------------------------------------------------------------------  DVT prophylaxis:  SCDs Start: 04/22/23 2148   Code Status:   Code Status: Full Code  Family Communication: No family member present at bedside- attempt will be  made to update daily The above findings and plan of care has been discussed with patient (and family)  in detail,  they expressed understanding and agreement of above. -Advance care planning has been discussed.   Admission status:   Status is: Inpatient Remains inpatient appropriate because: Needing evaluation by consultants GI and general surgery for significant small bowel obstruction, n.p.o. IV fluids pain management   Disposition: From  - home             Planning for discharge in 1-2 days: to   Procedures:   No admission procedures for hospital encounter.   Antimicrobials:  Anti-infectives (From admission, onward)    None        Medication:   diatrizoate meglumine-sodium  90 mL Per NG tube Once   enoxaparin (LOVENOX) injection  85 mg Subcutaneous Q12H   senna-docusate  1 tablet Per Tube BID    acetaminophen **OR** acetaminophen, diphenhydrAMINE, HYDROmorphone (DILAUDID) injection, metoprolol tartrate, ondansetron **OR** ondansetron (ZOFRAN) IV   Objective:   Vitals:   04/22/23 2302 04/22/23 2323 04/23/23 0216 04/23/23 0558  BP: 102/61  94/68 (!) 105/92  Pulse: 93  88 89  Resp: 19  17 17   Temp: 98.4 F (36.9 C)  98.2 F (36.8 C) 98.3 F (36.8 C)  TempSrc: Oral  Oral   SpO2: 93%  95% 97%  Weight:  84.9 kg    Height:        Intake/Output Summary (Last 24 hours) at 04/23/2023 1309 Last data filed at 04/23/2023 0300 Gross per 24 hour  Intake 329.65 ml  Output --  Net 329.65 ml   Filed Weights   04/22/23 1345 04/22/23 2323  Weight: 86.2 kg 84.9 kg     Physical examination:   Constitution:  Alert, cooperative, no distress,  Appears calm and comfortable  Psychiatric:   Normal and stable mood and affect, cognition intact,   HEENT:        Normocephalic, PERRL, otherwise with in Normal limits  Chest:         Chest symmetric Cardio vascular:  S1/S2, RRR, No murmure, No Rubs or Gallops  pulmonary: Clear to auscultation bilaterally, respirations  unlabored, negative wheezes / crackles Abdomen: Soft, diffuse tenderness, hypoactive bowel sounds non-distended, bowel sounds,no masses, no organomegaly Muscular skeletal: Limited exam - in bed, able to move all 4 extremities,   Neuro: CNII-XII intact. , normal motor and sensation, reflexes intact  Extremities: No pitting edema lower extremities, +2 pulses  Skin: Dry, warm to touch, negative for any Rashes, No open wounds Wounds: per nursing documentation   ------------------------------------------------------------------------------------------------------------------------------------------    LABs:     Latest Ref Rng & Units 04/23/2023    4:24 AM 04/22/2023    2:14 PM 02/08/2023    2:51 PM  CBC  WBC 4.0 - 10.5 K/uL 9.0  8.6  7.2   Hemoglobin 12.0 - 15.0 g/dL 16.1  09.6  04.5   Hematocrit 36.0 - 46.0 % 41.8  45.8  46.5   Platelets 150 - 400 K/uL 213  232  283       Latest Ref Rng & Units 04/23/2023    4:24 AM 04/22/2023    2:14 PM 12/02/2022    3:21 PM  CMP  Glucose 70 - 99 mg/dL 409  811  914   BUN 8 - 23 mg/dL 15  10  9    Creatinine 0.44 - 1.00 mg/dL 7.82  9.56  2.13   Sodium 135 - 145 mmol/L 138  137  143   Potassium 3.5 - 5.1 mmol/L 3.8  4.1  4.6   Chloride 98 - 111 mmol/L 104  102  103   CO2 22 - 32 mmol/L 25  24  24    Calcium 8.9 - 10.3 mg/dL 8.8  9.4  9.7   Total Protein 6.5 - 8.1 g/dL 5.9  6.7  6.4   Total Bilirubin 0.3 - 1.2 mg/dL 0.7  0.6  0.4   Alkaline Phos 38 - 126 U/L 81  97  131   AST 15 - 41 U/L 19  21  20    ALT 0 - 44 U/L 17  19  24         Micro Results No results found for this or any previous visit (from the past 240 hour(s)).  Radiology Reports DG Abd 1 View  Result Date: 04/23/2023 CLINICAL DATA:  Small bowel obstruction. EXAM: ABDOMEN - 1 VIEW COMPARISON:  CT of the abdomen and pelvis 04/22/2023 FINDINGS: Side port of the NG tube is in the fundus the stomach. Gas-filled loops of small bowel in the left upper quadrant are partially decompressed.  Gas and stool is again noted within the colon. Degenerative changes are present in the lower lumbar spine. Surgical clips are present at the gallbladder fossa and over the left abdomen. IMPRESSION: 1. Side port of the NG tube is in the fundus the stomach. 2. Partial decompression of small bowel loops in the left upper quadrant. Electronically Signed   By: Marin Roberts M.D.   On: 04/23/2023 08:19   DG Chest Port 1 View  Result Date: 04/22/2023 CLINICAL DATA:  Nasogastric tube placement. EXAM: PORTABLE CHEST 1 VIEW COMPARISON:  December 01, 2021 FINDINGS: A nasogastric tube is seen with its distal end overlying the body of the stomach. A distal side hole is approximately 6.7 cm distal to the expected region of the gastroesophageal junction. The heart size and mediastinal contours are within normal limits. Low lung volumes are noted with mild elevation of the right hemidiaphragm. Mild, diffuse, increased interstitial lung markings are noted with mild areas of linear atelectasis seen within the bilateral lung bases. There is no evidence of a pleural effusion or pneumothorax. The visualized skeletal structures are unremarkable. IMPRESSION: 1. Nasogastric tube positioning, as described above. 2. Mild bibasilar linear atelectasis. Electronically Signed   By: Aram Candela M.D.   On: 04/22/2023 21:51   CT ABDOMEN PELVIS WO CONTRAST  Result Date: 04/22/2023 CLINICAL DATA:  Acute nonlocalized abdominal pain. Left-sided abdominal pain radiating to the right. Nausea. Previous history of bowel obstructions. EXAM: CT ABDOMEN AND PELVIS WITHOUT CONTRAST TECHNIQUE: Multidetector CT imaging of the abdomen and pelvis was performed following the standard protocol without IV contrast. RADIATION DOSE REDUCTION: This exam was performed according to the departmental dose-optimization program which includes automated exposure control, adjustment of the  mA and/or kV according to patient size and/or use of iterative  reconstruction technique. COMPARISON:  06/03/2022 FINDINGS: Lower chest: Linear atelectasis in the lung bases. Right breast implant. Fatty infiltration in the region of the left breast tissue consistent with fat necrosis. This is possibly postoperative or posttraumatic. Similar appearance to previous study. Hepatobiliary: No focal liver abnormality is seen. Status post cholecystectomy. No biliary dilatation. Pancreas: Unremarkable. No pancreatic ductal dilatation or surrounding inflammatory changes. Spleen: Normal in size without focal abnormality. Adrenals/Urinary Tract: Adrenal glands are unremarkable. Kidneys are normal, without renal calculi, focal lesion, or hydronephrosis. Bladder is unremarkable. Stomach/Bowel: Stomach is distended with fluid and ingested material. Proximal small bowel are distended. Transition zone to mostly decompressed distal small bowel. There appears to be partial right hemicolectomy with ileocolonic anastomosis. Additional surgical clips throughout the abdomen. Location of obstruction may be localized to an anterior anastomosis just deep to the anterior abdominal wall. Stool-filled colon. Vascular/Lymphatic: No significant vascular findings are present. No enlarged abdominal or pelvic lymph nodes. Reproductive: No pelvic mass. Other: No free air or free fluid in the abdomen. Scarring and deformity of the anterior abdominal wall is likely postoperative. Musculoskeletal: No acute or significant osseous findings. IMPRESSION: Changes consistent with small-bowel obstruction with transition zone likely in the anterior abdomen at the site of anastomosis. Partial colectomy with ileocolonic anastomosis and stool-filled colon. Electronically Signed   By: Burman Nieves M.D.   On: 04/22/2023 20:32    SIGNED: Kendell Bane, MD, FHM. FAAFP. [facility] - [facility] Time spent - 55 min.  In seeing, evaluating and examining the patient. Reviewing medical records, labs, drawn plan of  care. [facility],  Pager (please use [email or web address] to page/ text) Please use Epic Secure Chat for non-urgent communication (7AM-7PM)  If 7PM-7AM, please contact night-coverage [email or web address], 04/23/2023, 1:09 PM

## 2023-04-23 NOTE — Progress Notes (Signed)
Patient admitted this shift. NG tube to intermittent suction. Patient received morphine twice this shift. Continued to monitor.

## 2023-04-23 NOTE — Assessment & Plan Note (Addendum)
-  Due to SBO, continue as needed IV analgesics

## 2023-04-23 NOTE — Assessment & Plan Note (Signed)
-   Resume Zetia and statin when patient is able to tolerate p.o. 

## 2023-04-23 NOTE — TOC Initial Note (Signed)
Transition of Care [facility]) - Initial/Assessment Note    Patient Details  Name: [name] MRN: [id number] Date of Birth: 1961-10-29  Transition of Care [facility] CM/SW Contact:    Villa Herb, LCSWA Phone Number: 04/23/2023, 9:09 AM  Clinical Narrative:                 Pt is high risk for readmission. CSW spoke with pt to complete assessment. Pt lives with her spouse. Pt is independent in completing her ADLs. Pt is able to drive when needed. Pt has services through [place], an agency based through her [facility] plan. Pt does not use any DME in the home. TOC to follow.   Expected Discharge Plan: Home/Self Care Barriers to Discharge: Continued Medical Work up   Patient Goals and CMS Choice Patient states their goals for this hospitalization and ongoing recovery are:: return home CMS Medicare.gov Compare Post Acute Care list provided to:: Patient Represenative (must comment) Choice offered to / list presented to : Spouse      Expected Discharge Plan and Services In-house Referral: Clinical Social Work Discharge Planning Services: CM Consult   Living arrangements for the past 2 months: Single Family Home                                      Prior Living Arrangements/Services Living arrangements for the past 2 months: Single Family Home Lives with:: Spouse   Do you feel safe going back to the place where you live?: Yes      Need for Family Participation in Patient Care: Yes (Comment) Care giver support system in place?: Yes (comment)   Criminal Activity/Legal Involvement Pertinent to Current Situation/Hospitalization: No - Comment as needed  Activities of Daily Living Home Assistive Devices/Equipment: None ADL Screening (condition at time of admission) Patient's cognitive ability adequate to safely complete daily activities?: Yes Is the patient deaf or have difficulty hearing?: No Does the patient have difficulty seeing, even when wearing  glasses/contacts?: No Does the patient have difficulty concentrating, remembering, or making decisions?: No Patient able to express need for assistance with ADLs?: No Does the patient have difficulty dressing or bathing?: No Independently performs ADLs?: Yes (appropriate for developmental age) Does the patient have difficulty walking or climbing stairs?: No Weakness of Legs: None Weakness of Arms/Hands: None  Permission Sought/Granted                  Emotional Assessment Appearance:: Appears stated age Attitude/Demeanor/Rapport: Engaged Affect (typically observed): Accepting Orientation: : Oriented to Self, Oriented to Place, Oriented to  Time, Oriented to Situation Alcohol / Substance Use: Not Applicable Psych Involvement: No (comment)  Admission diagnosis:  SBO (small bowel obstruction) (HCC) [K56.609] Patient Active Problem List   Diagnosis Date Noted   Atrial fibrillation, chronic (HCC) 04/23/2023   Onychomycosis 12/06/2022   Vaginitis 12/06/2022   Partial small bowel obstruction (HCC) 06/03/2022   Light-headedness 05/13/2022   Hypotensive episode 05/13/2022   Dehydration 05/12/2022   Opioid dependence (HCC) 04/02/2022   Myalgia, lower leg 04/01/2022   Generalized intestinal dysmotility    SBO (small bowel obstruction) (HCC) 03/28/2022   Dysphagia 12/04/2021   Hypokalemia    Hypomagnesemia    Dark stools    Anemia    Abnormal small bowel motility 11/28/2021   Gastroparesis 11/28/2021   Abnormal CT scan, small bowel    Dilation of small bowel anastomosis  Intractable vomiting 11/12/2021   Bilious vomiting with nausea 11/11/2021   Diarrhea 11/11/2021   Dilation of biliary tract 11/11/2021   Post-resection malabsorption 09/18/2021   Hematuria 07/18/2021   Bloating 04/17/2021   Chronic diarrhea 04/17/2021   ADHD, predominantly hyperactive type 01/20/2021   Muscle spasm 01/20/2021   Ovarian cancer (HCC) 12/03/2020   Long-term current use of opiate  analgesic 10/22/2020   Migraine 10/22/2020   Muscle spasms of both lower extremities 01/03/2020   Hip pain, right 07/08/2019   Low back pain radiating to right leg 07/04/2019   Abdominal pain 06/06/2019   B12 deficiency 10/11/2018   Other specified abnormal findings of blood chemistry 10/11/2018   Malabsorption 09/23/2018   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, left (HCC) 09/23/2018   Embedded foreign body 03/01/2018   Intermittent palpitations 08/22/2017   Snoring 12/21/2016   Bilateral hand pain 07/27/2016   History of DVT (deep vein thrombosis) 03/30/2016   History of pulmonary embolus (PE) 03/30/2016   S/P insertion of IVC (inferior vena caval) filter 03/05/2016   Headache syndrome 01/26/2016   Syncope 01/23/2016   Spinal stenosis of lumbar region with radiculopathy 08/28/2015   Vitamin D deficiency 05/05/2015   Uncontrolled pain 04/15/2015   Right flank pain 09/07/2014   Chronic right-sided low back pain with bilateral sciatica 07/17/2014   Chest pain 06/14/2014   Low ferritin level 06/14/2014   Adjustment disorder with anxiety 08/31/2013   Irregular heart rate 07/25/2013   GERD (gastroesophageal reflux disease) 05/10/2013   Seasonal allergies 05/10/2013   Encounter for monitoring opioid maintenance therapy 01/16/2013   Chronic pain syndrome 01/16/2013   Fatigue 09/17/2012   Ventral hernia 08/03/2012   Stress incontinence 02/18/2012   Insomnia 12/15/2011   Postsurgical menopause 11/13/2011   Interstitial cystitis 02/17/2011   Metabolic syndrome X 02/10/2011   CARPAL TUNNEL SYNDROME, RIGHT 06/09/2010   OVARIAN CYST 12/26/2009   Iron deficiency anemia 08/07/2008   Hx SBO 08/07/2008   Paget's disease of breast, left (HCC) 03/16/2008   Hypothyroidism 03/16/2008   Hyperlipidemia 03/16/2008   Anxiety state 03/16/2008   Whole body pain 03/16/2008   Fibromyalgia 03/16/2008   PCP:  Kerri Perches, MD Pharmacy:   [facility] [phone] -  [place],  - [address] AT [facility] [address] [place] [address] Phone: [phone] Fax: [phone]     Social Determinants of Health (SDOH) Social History: SDOH Screenings   Food Insecurity: No Food Insecurity (05/14/2021)  Housing: Low Risk  (07/24/2022)  Transportation Needs: Unknown (07/24/2022)  Alcohol Screen: Low Risk  (05/14/2021)  Depression (PHQ2-9): Low Risk  (12/02/2022)  Financial Resource Strain: Low Risk  (07/24/2022)  Physical Activity: Insufficiently Active (07/24/2022)  Social Connections: Moderately Integrated (07/24/2022)  Stress: No Stress Concern Present (07/24/2022)  Tobacco Use: Low Risk  (04/22/2023)   SDOH Interventions:     Readmission Risk Interventions    04/23/2023    9:08 AM 06/05/2022   11:24 AM 03/30/2022    1:37 PM  Readmission Risk Prevention Plan  Transportation Screening Complete Complete Complete  HRI or Home Care Consult Complete  Complete  Social Work Consult for Recovery Care Planning/Counseling Complete  Complete  Palliative Care Screening Not Applicable  Not Applicable  Medication Review Oceanographer) Complete Complete Complete  HRI or Home Care Consult  Complete   SW Recovery Care/Counseling Consult  Complete   Palliative Care Screening  Not Applicable  [place]  Not Applicable

## 2023-04-23 NOTE — Assessment & Plan Note (Signed)
Continue PPI ?

## 2023-04-24 ENCOUNTER — Inpatient Hospital Stay (HOSPITAL_COMMUNITY): Payer: Medicare Other

## 2023-04-24 DIAGNOSIS — K56609 Unspecified intestinal obstruction, unspecified as to partial versus complete obstruction: Secondary | ICD-10-CM | POA: Diagnosis not present

## 2023-04-24 LAB — CBC
HCT: 37.6 % (ref 36.0–46.0)
Hemoglobin: 12.5 g/dL (ref 12.0–15.0)
MCH: 30.9 pg (ref 26.0–34.0)
MCHC: 33.2 g/dL (ref 30.0–36.0)
MCV: 93.1 fL (ref 80.0–100.0)
Platelets: 162 10*3/uL (ref 150–400)
RBC: 4.04 MIL/uL (ref 3.87–5.11)
RDW: 13.6 % (ref 11.5–15.5)
WBC: 4.7 10*3/uL (ref 4.0–10.5)
nRBC: 0 % (ref 0.0–0.2)

## 2023-04-24 LAB — BASIC METABOLIC PANEL
Anion gap: 9 (ref 5–15)
BUN: 17 mg/dL (ref 8–23)
CO2: 23 mmol/L (ref 22–32)
Calcium: 8.4 mg/dL — ABNORMAL LOW (ref 8.9–10.3)
Chloride: 105 mmol/L (ref 98–111)
Creatinine, Ser: 0.6 mg/dL (ref 0.44–1.00)
GFR, Estimated: >60 mL/min (ref >60)
Glucose, Bld: 89 mg/dL (ref 70–99)
Potassium: 3.6 mmol/L (ref 3.5–5.1)
Sodium: 137 mmol/L (ref 135–145)

## 2023-04-24 MED ORDER — KETOROLAC TROMETHAMINE 15 MG/ML IJ SOLN: 15.0000 mg | Freq: Once | INTRAMUSCULAR | Status: DC

## 2023-04-24 MED ORDER — KCL IN DEXTROSE-NACL 10-5-0.45 MEQ/L-%-% IV SOLN
INTRAVENOUS | Status: DC
  Filled 2023-04-24 (×4): qty 1000

## 2023-04-24 MED ORDER — KCL IN DEXTROSE-NACL 10-5-0.45 MEQ/L-%-% IV SOLN: INTRAVENOUS | Status: DC

## 2023-04-24 MED ORDER — PROCHLORPERAZINE EDISYLATE 10 MG/2ML IJ SOLN: 10.0000 mg | Freq: Once | INTRAMUSCULAR | Status: DC

## 2023-04-24 NOTE — Progress Notes (Signed)
PROGRESS NOTE    Patient: [name]                            PCP: Kerri Perches, MD                    DOB: 1961-02-07            DOA: 04/22/2023 [id number]             DOS: 04/24/2023, 12:06 PM   LOS: 2 days   Date of Service: The patient was seen and examined on 04/24/2023  Subjective:   The patient was seen and examined this morning, reporting of gas and multiple bowel movements yesterday. NG tube still in place, denies any nausea or vomiting, Has had migraine headaches last night received migraine cocktail--which improved  Complaining of some headaches this morning requesting if migraine cocktail could be repeated  Otherwise stable ... Anticipating discontinuation of NG tube, advancing diet per surgery  Brief Narrative:   [name] is a 62 y.o. female with medical history significant of abdominal hernia, partial colectomy, chronic pain, GERD, hyperlipidemia, paroxysmal SVT currently undergoing workup with Zio patch, hypothyroidism, anxiety, and more presents the ED with a chief complaint of abdominal pain.   Patient reports that she has had 24 hours of abdominal cramping prior to arrival.  She has a history of gastroparesis at first she thought it was that.  Patient reports she has had multiple bowel surgeries and multiple SBO's in the past.  Her last SBO was December 2022.  The pain then became sharp and stabbing which is different than her gastroparesis pain.  She figured it was another SBO.  It was in the left and radiating to the right and hurting through her back.  She described the pain is waxing and waning like contractions.  She reports that the pain was becoming progressively worse and finally she had to come into the ER because it was too much to a stand.  She had a normal meal at 5 PM on the 24th.  Her last  bowel movement was also around that time on the 24th.  She reports it was loose and a small amount.  She felt urges to evacuate her  bowels all day on the 25th but was not able to do it.  She had no flatulence on the 25th.  She took 2 Amitiza to try to move her bowels, but it did not help.  She had some nausea with small amounts of emesis.  Patient has no other complaints at this time.   Patient does not smoke, does not drink, does not use illicit drugs.  She is vaccinated for COVID and flu.  Patient would like to be full code.     Assessment & Plan:   Principal Problem:   SBO (small bowel obstruction) (HCC) Active Problems:   GERD (gastroesophageal reflux disease)   Hypothyroidism   Hyperlipidemia   Anxiety state   Abdominal pain   Atrial fibrillation, chronic (HCC)     Assessment and Plan: * SBO (small bowel obstruction) (HCC) - Reporting of gas and bowel movement yesterday -Anticipating starting clear liquid diet by general surgery today -Status post IV fluid hydration and tolerating p.o. will discontinue today  -General surgery Dr. Lovell Sheehan consulted, initiating laxative, small bowel follow-through studies Which reported contrast in colon - NG tube in place-clamped - Monitor output  -Continue with IV analgesic  for pain control  - Continue to monitor -Encourage ambulation  GERD (gastroesophageal reflux disease) - Continue PPI  Atrial fibrillation, chronic (HCC) - Holding p.o. metoprolol and Xarelto until patient is able to tolerate p.o. - As needed IV metoprolol - Full dose Lovenox for now >>> anticipating switching to Xarelto back today - Zio patch in place - Currently asymptomatic - Continue to monitor  Abdominal pain - Improving abdominal pain, reporting gas and bowel movements -Due to SBO, continue as needed IV analgesics  Anxiety state -Continue to have anxiety and migraine headaches -A cocktail was given yesterday which helped with her headaches, - Resume Xanax when patient is able to take p.o - As needed IV Ativan   Hyperlipidemia - Resume Zetia and statin when patient is able  to tolerate p.o.  Hypothyroidism - Resume Synthroid when patient is able to tolerate p.o.    ---------------------------------------------------------------------------------------------------------------------------- Nutritional status:  [name] BMI is: Body mass index is 32.13 kg/m. I agree with the assessment and plan as outlined --------------------------------------------------------------------------------------------------------------------  DVT prophylaxis:  SCDs Start: 04/22/23 2148   Code Status:   Code Status: Full Code  Family Communication: No family member present at bedside- attempt will be made to update daily The above findings and plan of care has been discussed with patient (and family)  in detail,  they expressed understanding and agreement of above. -Advance care planning has been discussed.   Admission status:   Status is: Inpatient Remains inpatient appropriate because: Needing evaluation by consultants GI and general surgery for significant small bowel obstruction, n.p.o. IV fluids pain management   Disposition: From  - home             Planning for discharge in 1 days: to Home   Procedures:   No admission procedures for hospital encounter.   Antimicrobials:  Anti-infectives (From admission, onward)    None        Medication:   enoxaparin (LOVENOX) injection  85 mg Subcutaneous Q12H   ketorolac  15 mg Intravenous Once   prochlorperazine  10 mg Intravenous Once   senna-docusate  1 tablet Per Tube BID    acetaminophen **OR** acetaminophen, diphenhydrAMINE, HYDROmorphone (DILAUDID) injection, metoprolol tartrate, ondansetron **OR** ondansetron (ZOFRAN) IV   Objective:   Vitals:   04/23/23 0216 04/23/23 0558 04/23/23 2058 04/24/23 0500  BP: 94/68 (!) 105/92 (!) 100/57 (!) 99/59  Pulse: 88 89 86 79  Resp: 17 17 18 18   Temp: 98.2 F (36.8 C) 98.3 F (36.8 C) 98.2 F (36.8 C) 98.3 F (36.8 C)  TempSrc: Oral  Oral Oral  SpO2:  95% 97% 99% 97%  Weight:      Height:        Intake/Output Summary (Last 24 hours) at 04/24/2023 1206 Last data filed at 04/24/2023 0526 Gross per 24 hour  Intake 2170.67 ml  Output 801 ml  Net 1369.67 ml   Filed Weights   04/22/23 1345 04/22/23 2323  Weight: 86.2 kg 84.9 kg     Physical examination:       General:  AAO x 3,  cooperative, no distress;   HEENT:  Normocephalic, PERRL, otherwise with in Normal limits   Neuro:  CNII-XII intact. , normal motor and sensation, reflexes intact   Lungs:   Clear to auscultation BL, Respirations unlabored,  No wheezes / crackles  Cardio:    S1/S2, RRR, No murmure, No Rubs or Gallops   Abdomen:  Soft, mild diffuse tenderness, positive bowel sounds although hypoactive , in  all four quadrants, no guarding or peritoneal signs.  Muscular  skeletal:  Limited exam -global generalized weaknesses - in bed, able to move all 4 extremities,   2+ pulses,  symmetric, No pitting edema  Skin:  Dry, warm to touch, negative for any Rashes,  Wounds: Please see nursing documentation        -----------------------------------------------------------------------------------------------------------------------    LABs:     Latest Ref Rng & Units 04/24/2023    5:19 AM 04/23/2023    4:24 AM 04/22/2023    2:14 PM  CBC  WBC 4.0 - 10.5 K/uL 4.7  9.0  8.6   Hemoglobin 12.0 - 15.0 g/dL 16.1  09.6  04.5   Hematocrit 36.0 - 46.0 % 37.6  41.8  45.8   Platelets 150 - 400 K/uL 162  213  232       Latest Ref Rng & Units 04/24/2023    7:03 AM 04/23/2023    4:24 AM 04/22/2023    2:14 PM  CMP  Glucose 70 - 99 mg/dL 89  409  811   BUN 8 - 23 mg/dL 17  15  10    Creatinine 0.44 - 1.00 mg/dL 9.14  7.82  9.56   Sodium 135 - 145 mmol/L 137  138  137   Potassium 3.5 - 5.1 mmol/L 3.6  3.8  4.1   Chloride 98 - 111 mmol/L 105  104  102   CO2 22 - 32 mmol/L 23  25  24    Calcium 8.9 - 10.3 mg/dL 8.4  8.8  9.4   Total Protein 6.5 - 8.1 g/dL  5.9  6.7   Total  Bilirubin 0.3 - 1.2 mg/dL  0.7  0.6   Alkaline Phos 38 - 126 U/L  81  97   AST 15 - 41 U/L  19  21   ALT 0 - 44 U/L  17  19        Micro Results No results found for this or any previous visit (from the past 240 hour(s)).  Radiology Reports DG Abd Portable 1V-Small Bowel Protocol-Position Verification  Result Date: 04/23/2023 CLINICAL DATA:  Follow up small bowel obstruction EXAM: PORTABLE ABDOMEN - 1 VIEW COMPARISON:  Film from earlier in the same day. FINDINGS: Gastric catheter is again noted within the stomach. Some contrast remains in the stomach. The majority of administered contrast now lies within the colon consistent with a partial small bowel obstruction. The degree of small-bowel dilatation has improved when compare with the prior exam. IMPRESSION: Administered contrast now lies predominately within the colon consistent with a partial small bowel obstruction. Electronically Signed   By: Alcide Clever M.D.   On: 04/23/2023 22:03    SIGNED: Kendell Bane, MD, FHM. FAAFP. [facility] - [facility] Time spent - 55 min.  In seeing, evaluating and examining the patient. Reviewing medical records, labs, drawn plan of care. [facility],  Pager (please use [email or web address] to page/ text) Please use Epic Secure Chat for non-urgent communication (7AM-7PM)  If 7PM-7AM, please contact night-coverage [email or web address], 04/24/2023, 12:06 PM

## 2023-04-24 NOTE — Discharge Summary (Signed)
Physician Discharge Summary   Patient: [name] MRN: [id number] DOB: 27-Apr-1961  Admit date:     04/22/2023  Discharge date: 04/24/23  Discharge Physician: Kendell Bane   PCP: Kerri Perches, MD   Recommendations at discharge:  Soft diet advance as tolerated,  Ambulate as much as possible Follow-up with gastroenterologist and general surgery as needed  Discharge Diagnoses: Principal Problem:   SBO (small bowel obstruction) (HCC) Active Problems:   GERD (gastroesophageal reflux disease)   Hypothyroidism   Hyperlipidemia   Anxiety state   Abdominal pain   Atrial fibrillation, chronic (HCC)  Resolved Problems:   * No resolved hospital problems. *  Hospital Course: [name] is a 62 y.o. female with medical history significant of abdominal hernia, partial colectomy, chronic pain, GERD, hyperlipidemia, paroxysmal SVT currently undergoing workup with Zio patch, hypothyroidism, anxiety, and more presents the ED with a chief complaint of abdominal pain.   Patient reports that she has had 24 hours of abdominal cramping prior to arrival.  She has a history of gastroparesis at first she thought it was that.  Patient reports she has had multiple bowel surgeries and multiple SBO's in the past.  Her last SBO was December 2022.  The pain then became sharp and stabbing which is different than her gastroparesis pain.  She figured it was another SBO.  It was in the left and radiating to the right and hurting through her back.  She described the pain is waxing and waning like contractions.  She reports that the pain was becoming progressively worse and finally she had to come into the ER because it was too much to a stand.  She had a normal meal at 5 PM on the 24th.  Her last  bowel movement was also around that time on the 24th.  She reports it was loose and a small amount.  She felt urges to evacuate her bowels all day on the 25th but was not able to do it.  She had no  flatulence on the 25th.  She took 2 Amitiza to try to move her bowels, but it did not help.  She had some nausea with small amounts of emesis.  Patient has no other complaints at this time.   Patient does not smoke, does not drink, does not use illicit drugs.  She is vaccinated for COVID and flu.  Patient would like to be full code.    * SBO (small bowel obstruction) (HCC) -Resolved - Reporting of gas and bowel movement yesterday -clear liquid diet by general surgery today --Advancing diet as tolerated  -General surgery Dr. Lovell Sheehan consulted, initiating laxative, small bowel follow-through studies Which reported contrast in colon - NG tube in place-clamped>> will be discontinued  -Encourage ambulation  GERD (gastroesophageal reflux disease) - Continue PPI  Atrial fibrillation, chronic (HCC) - was on Holding  metoprolol and Xarelto -can be resumed - As needed IV metoprolol - Full dose Lovenox for now >>>  switching to Xarelto back today - Zio patch in place - Currently asymptomatic   Abdominal pain - Improving abdominal pain, reporting gas and bowel movements -Due to SBO, continue as needed IV analgesics  Anxiety state -Continue to have anxiety and migraine headaches -A cocktail was given yesterday which helped with her headaches, - Resume Xanax when patient is able to take p.o   Hyperlipidemia - Resume Zetia and statin   Hypothyroidism - Resume Synthroid      Consultants: General surgery/gastroenterologist Procedures performed:  Small GI follow-through Disposition: Home Diet recommendation:  Discharge Diet Orders (From admission, onward)     Start     Ordered   04/24/23 0000  Diet - low sodium heart healthy        04/24/23 1219           Clear liquid diet, advance as tolerated DISCHARGE MEDICATION: Allergies as of 04/24/2023       Reactions   Nitrofurantoin Hives   Crestor [rosuvastatin Calcium] Other (See Comments)   Generalized cramps   Bisacodyl  Other (See Comments)   Makes patient feel like she is having cramps   Codeine Itching   Iron Sucrose Other (See Comments)   Flushing- required benadryl and solu medrol   Monosodium Glutamate Other (See Comments)   Cluster migraines   Scopolamine Hbr Other (See Comments)   Cluster migraines, impaired vision   Other Hives, Itching, Swelling, Rash, Other (See Comments)   Cigarette smoke        Medication List     STOP taking these medications    levofloxacin 750 MG tablet Commonly known as: LEVAQUIN       TAKE these medications    ALPRAZolam 1 MG tablet Commonly known as: XANAX Take one tablet by mouth two times daily for anxiety   aspirin 81 MG chewable tablet Chew 81 mg by mouth daily.   BIOTIN PO Take 1 capsule by mouth daily.   butalbital-acetaminophen-caffeine 50-325-40 MG tablet Commonly known as: FIORICET Take 1 tablet by mouth every 6 (six) hours as needed.   cyanocobalamin 1000 MCG/ML injection Commonly known as: VITAMIN B12 Inject 1 mL (1,000 mcg total) into the muscle every 30 (thirty) days.   cyclobenzaprine 10 MG tablet Commonly known as: FLEXERIL TAKE 1 TABLET BY MOUTH TWICE DAILY AS NEEDED FOR MUSCLE SPASMS What changed: when to take this   cycloSPORINE 0.05 % ophthalmic emulsion Commonly known as: RESTASIS Place 1 drop into both eyes 2 (two) times daily as needed (dry eyes).   DULoxetine 60 MG capsule Commonly known as: CYMBALTA Take 60 mg by mouth daily.   erythromycin 250 MG EC tablet Commonly known as: ERY-TAB Take 1 tablet (250 mg total) by mouth 3 (three) times daily before meals. TAKE 1 TABLET BY MOUTH THREE TIMES DAILY BEFORE MEALS Strength: 250 mg   ezetimibe 10 MG tablet Commonly known as: ZETIA TAKE 1 TABLET(10 MG) BY MOUTH DAILY What changed: See the new instructions.   fluticasone 50 MCG/ACT nasal spray Commonly known as: FLONASE Place 2 sprays into both nostrils daily as needed for allergies.   HYDROmorphone 4 MG  tablet Commonly known as: DILAUDID Take 4 mg by mouth 3 (three) times daily.   ICAPS AREDS 2 PO Take 1 capsule by mouth every evening.   levothyroxine 125 MCG tablet Commonly known as: SYNTHROID TAKE 1 TABLET(125 MCG) BY MOUTH DAILY BEFORE BREAKFAST What changed: See the new instructions.   lubiprostone 24 MCG capsule Commonly known as: AMITIZA TAKE 1 CAPSULE(24 MCG) BY MOUTH TWICE DAILY WITH A MEAL What changed: See the new instructions.   magnesium oxide 400 (240 Mg) MG tablet Commonly known as: MAG-OX TAKE 1 TABLET BY MOUTH FOUR TIMES DAILY What changed:  how much to take how to take this when to take this additional instructions   metoprolol succinate 25 MG 24 hr tablet Commonly known as: TOPROL-XL Take 1 tablet (25 mg total) by mouth daily.   naloxone 4 MG/0.1ML Liqd nasal spray kit Commonly known as: NARCAN SMARTSIG:Both  Nares   omeprazole 40 MG capsule Commonly known as: PRILOSEC Take 1 capsule (40 mg total) by mouth in the morning and at bedtime.   OVER THE COUNTER MEDICATION Nutra-ful   phenazopyridine 95 MG tablet Commonly known as: PYRIDIUM Take by mouth.   polyethylene glycol 17 g packet Commonly known as: MIRALAX / GLYCOLAX Take by mouth.   potassium chloride 10 MEQ tablet Commonly known as: KLOR-CON M TAKE 1 TABLET(10 MEQ) BY MOUTH DAILY What changed: See the new instructions.   pravastatin 20 MG tablet Commonly known as: PRAVACHOL TAKE 1 TABLET(20 MG) BY MOUTH DAILY What changed: See the new instructions.   Premarin vaginal cream Generic drug: conjugated estrogens INSERT 1 APPLICATORFUL VAGINALLY DAILY What changed: See the new instructions.   sennosides-docusate sodium 8.6-50 MG tablet Commonly known as: SENOKOT-S as needed.   Vitamin D3 1.25 MG (50000 UT) Caps Take 1 capsule by mouth every 30 (thirty) days.   Xarelto 20 MG Tabs tablet Generic drug: rivaroxaban TAKE 1 TABLET(20 MG) BY MOUTH DAILY What changed: See the new  instructions.        Discharge Exam: Filed Weights   04/22/23 1345 04/22/23 2323  Weight: 86.2 kg 84.9 kg        General:  AAO x 3,  cooperative, no distress;   HEENT:  Normocephalic, PERRL, otherwise with in Normal limits   Neuro:  CNII-XII intact. , normal motor and sensation, reflexes intact   Lungs:   Clear to auscultation BL, Respirations unlabored,  No wheezes / crackles  Cardio:    S1/S2, RRR, No murmure, No Rubs or Gallops   Abdomen:  Soft, non-tender, bowel sounds active all four quadrants, no guarding or peritoneal signs.  Muscular  skeletal:  Limited exam -global generalized weaknesses - in bed, able to move all 4 extremities,   2+ pulses,  symmetric, No pitting edema  Skin:  Dry, warm to touch, negative for any Rashes,  Wounds: Please see nursing documentation          Condition at discharge: good  The results of significant diagnostics from this hospitalization (including imaging, microbiology, ancillary and laboratory) are listed below for reference.   Imaging Studies: DG Abd Portable 1V-Small Bowel Protocol-Position Verification  Result Date: 04/23/2023 CLINICAL DATA:  Follow up small bowel obstruction EXAM: PORTABLE ABDOMEN - 1 VIEW COMPARISON:  Film from earlier in the same day. FINDINGS: Gastric catheter is again noted within the stomach. Some contrast remains in the stomach. The majority of administered contrast now lies within the colon consistent with a partial small bowel obstruction. The degree of small-bowel dilatation has improved when compare with the prior exam. IMPRESSION: Administered contrast now lies predominately within the colon consistent with a partial small bowel obstruction. Electronically Signed   By: Alcide Clever M.D.   On: 04/23/2023 22:03   DG Abd 1 View  Result Date: 04/23/2023 CLINICAL DATA:  Small bowel obstruction. EXAM: ABDOMEN - 1 VIEW COMPARISON:  CT of the abdomen and pelvis 04/22/2023 FINDINGS: Side port of the NG tube is  in the fundus the stomach. Gas-filled loops of small bowel in the left upper quadrant are partially decompressed. Gas and stool is again noted within the colon. Degenerative changes are present in the lower lumbar spine. Surgical clips are present at the gallbladder fossa and over the left abdomen. IMPRESSION: 1. Side port of the NG tube is in the fundus the stomach. 2. Partial decompression of small bowel loops in the left upper quadrant. Electronically Signed  By: Marin Roberts M.D.   On: 04/23/2023 08:19   DG Chest Port 1 View  Result Date: 04/22/2023 CLINICAL DATA:  Nasogastric tube placement. EXAM: PORTABLE CHEST 1 VIEW COMPARISON:  December 01, 2021 FINDINGS: A nasogastric tube is seen with its distal end overlying the body of the stomach. A distal side hole is approximately 6.7 cm distal to the expected region of the gastroesophageal junction. The heart size and mediastinal contours are within normal limits. Low lung volumes are noted with mild elevation of the right hemidiaphragm. Mild, diffuse, increased interstitial lung markings are noted with mild areas of linear atelectasis seen within the bilateral lung bases. There is no evidence of a pleural effusion or pneumothorax. The visualized skeletal structures are unremarkable. IMPRESSION: 1. Nasogastric tube positioning, as described above. 2. Mild bibasilar linear atelectasis. Electronically Signed   By: Aram Candela M.D.   On: 04/22/2023 21:51   CT ABDOMEN PELVIS WO CONTRAST  Result Date: 04/22/2023 CLINICAL DATA:  Acute nonlocalized abdominal pain. Left-sided abdominal pain radiating to the right. Nausea. Previous history of bowel obstructions. EXAM: CT ABDOMEN AND PELVIS WITHOUT CONTRAST TECHNIQUE: Multidetector CT imaging of the abdomen and pelvis was performed following the standard protocol without IV contrast. RADIATION DOSE REDUCTION: This exam was performed according to the departmental dose-optimization program which includes  automated exposure control, adjustment of the mA and/or kV according to patient size and/or use of iterative reconstruction technique. COMPARISON:  06/03/2022 FINDINGS: Lower chest: Linear atelectasis in the lung bases. Right breast implant. Fatty infiltration in the region of the left breast tissue consistent with fat necrosis. This is possibly postoperative or posttraumatic. Similar appearance to previous study. Hepatobiliary: No focal liver abnormality is seen. Status post cholecystectomy. No biliary dilatation. Pancreas: Unremarkable. No pancreatic ductal dilatation or surrounding inflammatory changes. Spleen: Normal in size without focal abnormality. Adrenals/Urinary Tract: Adrenal glands are unremarkable. Kidneys are normal, without renal calculi, focal lesion, or hydronephrosis. Bladder is unremarkable. Stomach/Bowel: Stomach is distended with fluid and ingested material. Proximal small bowel are distended. Transition zone to mostly decompressed distal small bowel. There appears to be partial right hemicolectomy with ileocolonic anastomosis. Additional surgical clips throughout the abdomen. Location of obstruction may be localized to an anterior anastomosis just deep to the anterior abdominal wall. Stool-filled colon. Vascular/Lymphatic: No significant vascular findings are present. No enlarged abdominal or pelvic lymph nodes. Reproductive: No pelvic mass. Other: No free air or free fluid in the abdomen. Scarring and deformity of the anterior abdominal wall is likely postoperative. Musculoskeletal: No acute or significant osseous findings. IMPRESSION: Changes consistent with small-bowel obstruction with transition zone likely in the anterior abdomen at the site of anastomosis. Partial colectomy with ileocolonic anastomosis and stool-filled colon. Electronically Signed   By: Burman Nieves M.D.   On: 04/22/2023 20:32    Microbiology: Results for orders placed or performed during the hospital encounter of  06/03/22  C Difficile Quick Screen w PCR reflex     Status: None   Collection Time: 06/05/22 11:43 AM   Specimen: STOOL  Result Value Ref Range Status   C Diff antigen NEGATIVE NEGATIVE Final   C Diff toxin NEGATIVE NEGATIVE Final   C Diff interpretation No C. difficile detected.  Final    Comment: Performed at [facility], [address] [place], [place] [address]  Gastrointestinal Panel by PCR , Stool     Status: Abnormal   Collection Time: 06/05/22 11:44 AM   Specimen: STOOL  Result Value Ref Range Status  Campylobacter species NOT DETECTED NOT DETECTED Final   Plesimonas shigelloides NOT DETECTED NOT DETECTED Final   Salmonella species NOT DETECTED NOT DETECTED Final   Yersinia enterocolitica NOT DETECTED NOT DETECTED Final   Vibrio species NOT DETECTED NOT DETECTED Final   Vibrio cholerae NOT DETECTED NOT DETECTED Final   Enteroaggregative E coli (EAEC) NOT DETECTED NOT DETECTED Final   Enteropathogenic E coli (EPEC) NOT DETECTED NOT DETECTED Final   Enterotoxigenic E coli (ETEC) NOT DETECTED NOT DETECTED Final   Shiga like toxin producing E coli (STEC) NOT DETECTED NOT DETECTED Final   Shigella/Enteroinvasive E coli (EIEC) NOT DETECTED NOT DETECTED Final   Cryptosporidium NOT DETECTED NOT DETECTED Final   Cyclospora cayetanensis NOT DETECTED NOT DETECTED Final   Entamoeba histolytica NOT DETECTED NOT DETECTED Final   Giardia lamblia NOT DETECTED NOT DETECTED Final   Adenovirus F40/41 NOT DETECTED NOT DETECTED Final   Astrovirus DETECTED (A) NOT DETECTED Final   Norovirus GI/GII NOT DETECTED NOT DETECTED Final   Rotavirus A NOT DETECTED NOT DETECTED Final   Sapovirus (I, II, IV, and V) NOT DETECTED NOT DETECTED Final    Comment: Performed at [facility], [address] [place], [place] [address]   *Note: Due to a large number of results and/or encounters for the requested time period, some results have not been displayed. A complete set of results can be found in  Results Review.    Labs: CBC: Recent Labs  Lab 04/22/23 1414 04/23/23 0424 04/24/23 0519  WBC 8.6 9.0 4.7  NEUTROABS  --  5.8  --   HGB 15.3* 13.8 12.5  HCT 45.8 41.8 37.6  MCV 90.2 91.1 93.1  PLT 232 213 162   Basic Metabolic Panel: Recent Labs  Lab 04/22/23 1414 04/23/23 0424 04/24/23 0703  NA 137 138 137  K 4.1 3.8 3.6  CL 102 104 105  CO2 24 25 23   GLUCOSE 121* 103* 89  BUN 10 15 17   CREATININE 0.80 0.70 0.60  CALCIUM 9.4 8.8* 8.4*  MG  --  1.8  --    Liver Function Tests: Recent Labs  Lab 04/22/23 1414 04/23/23 0424  AST 21 19  ALT 19 17  ALKPHOS 97 81  BILITOT 0.6 0.7  PROT 6.7 5.9*  ALBUMIN 4.0 3.4*   CBG: No results for input(s): "GLUCAP" in the last 168 hours.  Discharge time spent: greater than 30 minutes.  Signed: Kendell Bane, MD [facility] 04/24/2023

## 2023-04-24 NOTE — Progress Notes (Signed)
Subjective: Patient had a bowel movement and is passing flatus.  Objective: Vital signs in last 24 hours: Temp:  [98.2 F (36.8 C)-98.3 F (36.8 C)] 98.3 F (36.8 C) (04/27 0500) Pulse Rate:  [79-86] 79 (04/27 0500) Resp:  [18] 18 (04/27 0500) BP: (99-100)/(57-59) 99/59 (04/27 0500) SpO2:  [97 %-99 %] 97 % (04/27 0500) Last BM Date : 04/23/23  Intake/Output from previous day: 04/26 0701 - 04/27 0700 In: 2170.7 [I.V.:2170.7] Out: 801 [Emesis/NG output:800; Stool:1] Intake/Output this shift: No intake/output data recorded.  General appearance: alert, cooperative, and no distress GI: Soft with minimal discomfort to palpation.  No rigidity is noted.  Lab Results:  Recent Labs    04/23/23 0424 04/24/23 0519  WBC 9.0 4.7  HGB 13.8 12.5  HCT 41.8 37.6  PLT 213 162   BMET Recent Labs    04/23/23 0424 04/24/23 0703  NA 138 137  K 3.8 3.6  CL 104 105  CO2 25 23  GLUCOSE 103* 89  BUN 15 17  CREATININE 0.70 0.60  CALCIUM 8.8* 8.4*   PT/INR No results for input(s): "LABPROT", "INR" in the last 72 hours.  Studies/Results: DG Abd Portable 1V-Small Bowel Protocol-Position Verification  Result Date: 04/23/2023 CLINICAL DATA:  Follow up small bowel obstruction EXAM: PORTABLE ABDOMEN - 1 VIEW COMPARISON:  Film from earlier in the same day. FINDINGS: Gastric catheter is again noted within the stomach. Some contrast remains in the stomach. The majority of administered contrast now lies within the colon consistent with a partial small bowel obstruction. The degree of small-bowel dilatation has improved when compare with the prior exam. IMPRESSION: Administered contrast now lies predominately within the colon consistent with a partial small bowel obstruction. Electronically Signed   By: Alcide Clever M.D.   On: 04/23/2023 22:03   DG Abd 1 View  Result Date: 04/23/2023 CLINICAL DATA:  Small bowel obstruction. EXAM: ABDOMEN - 1 VIEW COMPARISON:  CT of the abdomen and pelvis  04/22/2023 FINDINGS: Side port of the NG tube is in the fundus the stomach. Gas-filled loops of small bowel in the left upper quadrant are partially decompressed. Gas and stool is again noted within the colon. Degenerative changes are present in the lower lumbar spine. Surgical clips are present at the gallbladder fossa and over the left abdomen. IMPRESSION: 1. Side port of the NG tube is in the fundus the stomach. 2. Partial decompression of small bowel loops in the left upper quadrant. Electronically Signed   By: Marin Roberts M.D.   On: 04/23/2023 08:19   DG Chest Port 1 View  Result Date: 04/22/2023 CLINICAL DATA:  Nasogastric tube placement. EXAM: PORTABLE CHEST 1 VIEW COMPARISON:  December 01, 2021 FINDINGS: A nasogastric tube is seen with its distal end overlying the body of the stomach. A distal side hole is approximately 6.7 cm distal to the expected region of the gastroesophageal junction. The heart size and mediastinal contours are within normal limits. Low lung volumes are noted with mild elevation of the right hemidiaphragm. Mild, diffuse, increased interstitial lung markings are noted with mild areas of linear atelectasis seen within the bilateral lung bases. There is no evidence of a pleural effusion or pneumothorax. The visualized skeletal structures are unremarkable. IMPRESSION: 1. Nasogastric tube positioning, as described above. 2. Mild bibasilar linear atelectasis. Electronically Signed   By: Aram Candela M.D.   On: 04/22/2023 21:51   CT ABDOMEN PELVIS WO CONTRAST  Result Date: 04/22/2023 CLINICAL DATA:  Acute nonlocalized abdominal pain. Left-sided  abdominal pain radiating to the right. Nausea. Previous history of bowel obstructions. EXAM: CT ABDOMEN AND PELVIS WITHOUT CONTRAST TECHNIQUE: Multidetector CT imaging of the abdomen and pelvis was performed following the standard protocol without IV contrast. RADIATION DOSE REDUCTION: This exam was performed according to the  departmental dose-optimization program which includes automated exposure control, adjustment of the mA and/or kV according to patient size and/or use of iterative reconstruction technique. COMPARISON:  06/03/2022 FINDINGS: Lower chest: Linear atelectasis in the lung bases. Right breast implant. Fatty infiltration in the region of the left breast tissue consistent with fat necrosis. This is possibly postoperative or posttraumatic. Similar appearance to previous study. Hepatobiliary: No focal liver abnormality is seen. Status post cholecystectomy. No biliary dilatation. Pancreas: Unremarkable. No pancreatic ductal dilatation or surrounding inflammatory changes. Spleen: Normal in size without focal abnormality. Adrenals/Urinary Tract: Adrenal glands are unremarkable. Kidneys are normal, without renal calculi, focal lesion, or hydronephrosis. Bladder is unremarkable. Stomach/Bowel: Stomach is distended with fluid and ingested material. Proximal small bowel are distended. Transition zone to mostly decompressed distal small bowel. There appears to be partial right hemicolectomy with ileocolonic anastomosis. Additional surgical clips throughout the abdomen. Location of obstruction may be localized to an anterior anastomosis just deep to the anterior abdominal wall. Stool-filled colon. Vascular/Lymphatic: No significant vascular findings are present. No enlarged abdominal or pelvic lymph nodes. Reproductive: No pelvic mass. Other: No free air or free fluid in the abdomen. Scarring and deformity of the anterior abdominal wall is likely postoperative. Musculoskeletal: No acute or significant osseous findings. IMPRESSION: Changes consistent with small-bowel obstruction with transition zone likely in the anterior abdomen at the site of anastomosis. Partial colectomy with ileocolonic anastomosis and stool-filled colon. Electronically Signed   By: Burman Nieves M.D.   On: 04/22/2023 20:32    Anti-infectives: Anti-infectives  (From admission, onward)    None       Assessment/Plan: Impression: Small bowel obstruction protocol study showed contrast in the colon after 8 hours.  Decreased dilatation of the bowel noted.  NG tube output has been minimal.  She does not have evidence of a complete bowel obstruction.  Her NG tube has been removed and she was placed on a full liquid diet.  Patient would like to be discharged home and advance her diet as tolerated.  I have no problems with this decision.  She will follow-up with [facility] general surgery as needed.  LOS: 2 days    Franky Macho 04/24/2023

## 2023-04-26 ENCOUNTER — Telehealth: Payer: Self-pay

## 2023-04-26 NOTE — Transitions of Care (Post Inpatient/ED Visit) (Signed)
   04/26/2023  Name: Chamille Werntz MRN: [id number] DOB: 11-22-1961  Today's TOC FU Call Status:    Transition Care Management Follow-up Telephone Call Date of Discharge: 04/24/23 [facility] Type of Discharge: Inpatient Admission Primary Inpatient Discharge Diagnosis:: small bowel obstration Reason for ED Visit: Other: How have you been since you were released from the hospital?: Better Any questions or concerns?: No  Items Reviewed: Did you receive and understand the discharge instructions provided?: Yes Medications obtained and verified?: Yes (Medications Reviewed) Any new allergies since your discharge?: Yes  Home Care and Equipment/Supplies: Were Home Health Services Ordered?: No Any new equipment or medical supplies ordered?: No  Functional Questionnaire: Do you need assistance with bathing/showering or dressing?: No Do you need assistance with meal preparation?: No Do you need assistance with eating?: No Do you have difficulty maintaining continence: No Do you need assistance with getting out of bed/getting out of a chair/moving?: No Do you have difficulty managing or taking your medications?: No  Follow up appointments reviewed: PCP Follow-up appointment confirmed?: Yes Date of PCP follow-up appointment?: 04/29/23 Follow-up Provider: simpson, [facility] Follow-up appointment confirmed?: NA Do you need transportation to your follow-up appointment?: No Do you understand care options if your condition(s) worsen?: Yes-patient verbalized understanding    SIGNATURE  Juquan Reznick,CMA [facility], [place]

## 2023-04-29 ENCOUNTER — Ambulatory Visit (INDEPENDENT_AMBULATORY_CARE_PROVIDER_SITE_OTHER): Payer: Medicare Other | Admitting: Family Medicine

## 2023-04-29 VITALS — BP 105/74 | HR 88 | Resp 16 | Ht 64.5 in | Wt 189.1 lb

## 2023-04-29 DIAGNOSIS — N895 Stricture and atresia of vagina: Secondary | ICD-10-CM

## 2023-04-29 DIAGNOSIS — E785 Hyperlipidemia, unspecified: Secondary | ICD-10-CM

## 2023-04-29 DIAGNOSIS — N951 Menopausal and female climacteric states: Secondary | ICD-10-CM

## 2023-04-29 DIAGNOSIS — E039 Hypothyroidism, unspecified: Secondary | ICD-10-CM

## 2023-04-29 DIAGNOSIS — Z7689 Persons encountering health services in other specified circumstances: Secondary | ICD-10-CM

## 2023-04-29 DIAGNOSIS — E559 Vitamin D deficiency, unspecified: Secondary | ICD-10-CM

## 2023-04-29 MED ORDER — OMEPRAZOLE 40 MG PO CPDR: 40.0000 mg | DELAYED_RELEASE_CAPSULE | Freq: Two times a day (BID) | ORAL | 3 refills | Status: DC

## 2023-04-29 MED ORDER — POTASSIUM CHLORIDE CRYS ER 10 MEQ PO TBCR: 10.0000 meq | EXTENDED_RELEASE_TABLET | Freq: Two times a day (BID) | ORAL | 3 refills | Status: DC

## 2023-04-29 NOTE — Patient Instructions (Addendum)
F/U in 4 months, call if you need me sooner  You are being referred to gyne in [place] for pelvic, pap and management of atrophy  Fasting lipid, cmp and eGFr, tSH and  Vit D level in the next  week   Thanks for choosing [facility], we consider it a privelige to serve you.

## 2023-05-01 ENCOUNTER — Other Ambulatory Visit: Payer: Self-pay | Admitting: Family Medicine

## 2023-05-01 DIAGNOSIS — Z86711 Personal history of pulmonary embolism: Secondary | ICD-10-CM

## 2023-05-03 ENCOUNTER — Encounter (HOSPITAL_COMMUNITY): Payer: Self-pay | Admitting: Hematology

## 2023-05-03 ENCOUNTER — Encounter: Payer: Self-pay | Admitting: Family Medicine

## 2023-05-03 DIAGNOSIS — Z7689 Persons encountering health services in other specified circumstances: Secondary | ICD-10-CM | POA: Insufficient documentation

## 2023-05-03 DIAGNOSIS — N895 Stricture and atresia of vagina: Secondary | ICD-10-CM | POA: Insufficient documentation

## 2023-05-03 DIAGNOSIS — N951 Menopausal and female climacteric states: Secondary | ICD-10-CM | POA: Insufficient documentation

## 2023-05-03 MED ORDER — CLOBETASOL PROPIONATE 0.05 % EX OINT: TOPICAL_OINTMENT | CUTANEOUS | 0 refills | Status: DC

## 2023-05-03 NOTE — Assessment & Plan Note (Signed)
Worsening symptoms despite topical premarin, reports stenosis and recurrent UTI's add topical clobetaslol daily at  bedtime, limited am and refer to [facility]

## 2023-05-03 NOTE — Progress Notes (Signed)
   [name]     MRN: [id number]      DOB: 1961/09/20  Chief Complaint  Patient presents with   Transitions Of Care    D/c 4/27 from [facility] gastroparesis   vaginal dryness    Doesn't know if there is something better than premarin she can use     HPI [name] is here for [facility] vist from recent hospitalization from 4/25 to 04/24/2023 when she presented with SBO and fortunately corrected within a short time  Tolerating her regular diet since her discharge C/o vaginal stenosis and dryness , wants to know if here is alternate or additional medication for this, also bothered with recurrent UTI's as a result   ROS See HPI  Denies recent fever or chills. Denies sinus pressure, nasal congestion, ear pain or sore throat. Denies chest congestion, productive cough or wheezing. Denies chest pains, palpitations and leg swelling . Marland Kitchen Denies depression, anxiety or insomnia. Denies skin break down or rash.   PE  BP 105/74   Pulse 88   Resp 16   Ht 5' 4.5" (1.638 m)   Wt 189 lb 1.9 oz (85.8 kg)   SpO2 94%   BMI 31.96 kg/m   Patient alert and oriented and in no cardiopulmonary distress.  HEENT: No facial asymmetry, EOMI,     Neck supple .  Chest: Clear to auscultation bilaterally.  CVS: S1, S2 no murmurs, no S3.Regular rate.  ABD:  non tender.  Pelvic: Not done/ defer to Gyne  Ext: No edema  MS: Adequate ROM spine, shoulders, hips and knees.  Skin: Intact, no ulcerations or rash noted.  Psych: Good eye contact, normal affect. Memory intact not anxious or depressed appearing.  CNS: CN 2-12 intact, power,  normal throughout.no focal deficits noted.   Assessment & Plan  Encounter for support and coordination of transition of care Patient in for follow up of recent hospitalization. Discharge summary, and laboratory and radiology data are reviewed, and any questions or concerns  are discussed. Specific issues requiring follow up are specifically  addressed.   Menopausal vaginal dryness Worsening symptoms despite topical premarin, reports stenosis and recurrent UTI's add topical clobetaslol daily at  bedtime, limited am and refer to Gyne  Vaginal stenosis Increasingly symptomatic, Gyne to eval

## 2023-05-03 NOTE — Assessment & Plan Note (Signed)
Patient in for follow up of recent hospitalization. Discharge summary, and laboratory and radiology data are reviewed, and any questions or concerns  are discussed. Specific issues requiring follow up are specifically addressed.  

## 2023-05-03 NOTE — Assessment & Plan Note (Signed)
Increasingly symptomatic, Gyne to eval

## 2023-05-05 DIAGNOSIS — I471 Supraventricular tachycardia, unspecified: Secondary | ICD-10-CM | POA: Diagnosis not present

## 2023-05-05 DIAGNOSIS — R030 Elevated blood-pressure reading, without diagnosis of hypertension: Secondary | ICD-10-CM | POA: Diagnosis not present

## 2023-05-05 DIAGNOSIS — E669 Obesity, unspecified: Secondary | ICD-10-CM | POA: Diagnosis not present

## 2023-05-05 DIAGNOSIS — Z6833 Body mass index (BMI) 33.0-33.9, adult: Secondary | ICD-10-CM | POA: Diagnosis not present

## 2023-05-05 DIAGNOSIS — M25531 Pain in right wrist: Secondary | ICD-10-CM | POA: Diagnosis not present

## 2023-05-05 DIAGNOSIS — R002 Palpitations: Secondary | ICD-10-CM | POA: Diagnosis not present

## 2023-05-09 ENCOUNTER — Other Ambulatory Visit: Payer: Self-pay | Admitting: Family Medicine

## 2023-05-10 DIAGNOSIS — R030 Elevated blood-pressure reading, without diagnosis of hypertension: Secondary | ICD-10-CM | POA: Diagnosis not present

## 2023-05-10 DIAGNOSIS — R1084 Generalized abdominal pain: Secondary | ICD-10-CM | POA: Diagnosis not present

## 2023-05-10 DIAGNOSIS — M3500 Sicca syndrome, unspecified: Secondary | ICD-10-CM | POA: Diagnosis not present

## 2023-05-10 DIAGNOSIS — G894 Chronic pain syndrome: Secondary | ICD-10-CM | POA: Diagnosis not present

## 2023-05-10 DIAGNOSIS — Z6833 Body mass index (BMI) 33.0-33.9, adult: Secondary | ICD-10-CM | POA: Diagnosis not present

## 2023-05-11 ENCOUNTER — Other Ambulatory Visit (INDEPENDENT_AMBULATORY_CARE_PROVIDER_SITE_OTHER): Payer: Self-pay | Admitting: Gastroenterology

## 2023-05-11 DIAGNOSIS — K3184 Gastroparesis: Secondary | ICD-10-CM

## 2023-05-12 DIAGNOSIS — D8989 Other specified disorders involving the immune mechanism, not elsewhere classified: Secondary | ICD-10-CM | POA: Diagnosis not present

## 2023-05-12 DIAGNOSIS — M2540 Effusion, unspecified joint: Secondary | ICD-10-CM | POA: Diagnosis not present

## 2023-05-12 DIAGNOSIS — M79642 Pain in left hand: Secondary | ICD-10-CM | POA: Diagnosis not present

## 2023-05-12 DIAGNOSIS — R682 Dry mouth, unspecified: Secondary | ICD-10-CM | POA: Diagnosis not present

## 2023-05-12 DIAGNOSIS — H04129 Dry eye syndrome of unspecified lacrimal gland: Secondary | ICD-10-CM | POA: Diagnosis not present

## 2023-05-12 DIAGNOSIS — M79641 Pain in right hand: Secondary | ICD-10-CM | POA: Diagnosis not present

## 2023-05-14 ENCOUNTER — Ambulatory Visit (HOSPITAL_COMMUNITY): Admission: RE | Admit: 2023-05-14 | Payer: Medicare Other | Source: Ambulatory Visit

## 2023-05-17 ENCOUNTER — Telehealth (HOSPITAL_BASED_OUTPATIENT_CLINIC_OR_DEPARTMENT_OTHER): Payer: Self-pay

## 2023-05-17 NOTE — Telephone Encounter (Addendum)
Results called to patient who verbalizes understanding! She states she had to cancel her Echo due to her husband being very sick and has been trying to get it rescheduled at [facility], but cannot get a hold of anyone to reschedule it. She states everytime she calls she is transferred to someone else who transfers her. Advised her would send it over to our scheduling team and she can expect a call from them. Patient verbalizes understanding and is appreciative of the call.    ----- Message from Alver Sorrow, NP sent at 05/17/2023  8:10 AM EDT ----- Monitor with predominantly normal sinus rhythm with average heart rate of 80 bpm.  There were 5 very short episodes of fast heart beat in the top chamber of the heart (SVT) which is not dangerous.Triggered episodes associated with sinus rhythm or occasional early beat.  These are not dangerous and not of concern.  Recommend continuing same dose of metoprolol succinate 25 mg daily with additional half tablet as needed for breakthrough palpitations.  Of note she did not have echocardiogram done-we can reschedule or she can discuss at her follow-up visit 05/28/2023.

## 2023-05-20 ENCOUNTER — Other Ambulatory Visit: Payer: Self-pay

## 2023-05-20 DIAGNOSIS — K56609 Unspecified intestinal obstruction, unspecified as to partial versus complete obstruction: Secondary | ICD-10-CM | POA: Diagnosis not present

## 2023-05-20 DIAGNOSIS — D509 Iron deficiency anemia, unspecified: Secondary | ICD-10-CM

## 2023-05-21 ENCOUNTER — Inpatient Hospital Stay: Payer: Medicare Other

## 2023-05-25 ENCOUNTER — Encounter: Payer: Self-pay | Admitting: Adult Health

## 2023-05-25 ENCOUNTER — Ambulatory Visit: Payer: Medicare Other | Admitting: Adult Health

## 2023-05-25 ENCOUNTER — Other Ambulatory Visit: Payer: Self-pay | Admitting: Family Medicine

## 2023-05-25 VITALS — BP 120/76 | HR 87 | Ht 64.5 in | Wt 196.0 lb

## 2023-05-25 DIAGNOSIS — Z90721 Acquired absence of ovaries, unilateral: Secondary | ICD-10-CM

## 2023-05-25 DIAGNOSIS — Z1231 Encounter for screening mammogram for malignant neoplasm of breast: Secondary | ICD-10-CM

## 2023-05-25 DIAGNOSIS — Z90710 Acquired absence of both cervix and uterus: Secondary | ICD-10-CM | POA: Diagnosis not present

## 2023-05-25 DIAGNOSIS — N898 Other specified noninflammatory disorders of vagina: Secondary | ICD-10-CM | POA: Diagnosis not present

## 2023-05-25 NOTE — Patient Instructions (Signed)
Use LUVENA every 72 hours

## 2023-05-25 NOTE — Progress Notes (Signed)
  Subjective:     Patient ID: [name], female   DOB: [birth year], 62 y.o.   MRN: [id number]  HPI [name] is a 62 year old white female,married, sp hysterectomy in complaining of vaginal dryness, she is using 1 gm Premarin vaginal cream 2-3 x a week for th most part. Still feels dry, and thin. She has temovate too.  She had PE in 1997 and Pagets left breast 2006.   PCP is Dr Lodema Hong  Review of Systems +vaginal dryness Not having sex Reviewed past medical,surgical, social and family history. Reviewed medications and allergies.     Objective:   Physical Exam BP 120/76 (BP Location: Left Arm, Patient Position: Sitting, Cuff Size: Normal)   Pulse 87   Ht 5' 4.5" (1.638 m)   Wt 196 lb (88.9 kg)   BMI 33.12 kg/m  Skin warm and dry.Pelvic: external genitalia is normal in appearance no lesions, vagina: pale, has white cream in vault,urethra has no lesions or masses noted, cervix and uterus are absent,adnexa: no masses or tenderness noted. Bladder is non tender and no masses felt.  AA is 0  Fall risk is moderate    05/25/2023    2:22 PM 12/02/2022    2:31 PM 07/24/2022    2:57 PM  Depression screen PHQ 2/9  Decreased Interest 0 0 0  Down, Depressed, Hopeless 0 0 0  PHQ - 2 Score 0 0 0  Altered sleeping 2    Tired, decreased energy 1    Change in appetite 1    Feeling bad or failure about yourself  0    Trouble concentrating 0    Moving slowly or fidgety/restless 0    Suicidal thoughts 0    PHQ-9 Score 4         05/25/2023    2:22 PM  GAD 7 : Generalized Anxiety Score  Nervous, Anxious, on Edge 0  Control/stop worrying 0  Worry too much - different things 0  Trouble relaxing 0  Restless 0  Easily annoyed or irritable 0  Afraid - awful might happen 0  Total GAD 7 Score 0      Upstream - 05/25/23 1441       Pregnancy Intention Screening   Does the patient want to become pregnant in the next year? N/A    Does the patient's partner want to become pregnant in the  next year? N/A    Would the patient like to discuss contraceptive options today? N/A      Contraception Wrap Up   Current Method Female Sterilization   hyst   End Method Female Sterilization   hyst   Contraception Counseling Provided No            Examination chaperoned by Malachy Mood LPN    Assessment:     1. Vaginal dryness Continue PVC Try luvena every 72 hours and try coconut oil every day at introitus Use wet wipes and pat dry Do not use soap in vaginal area Will recheck in 2 weeks   2. S/P hysterectomy with oophorectomy     Plan:     Follow up in 2 weeks

## 2023-05-27 ENCOUNTER — Inpatient Hospital Stay: Payer: Medicare Other | Attending: Hematology | Admitting: Hematology

## 2023-05-27 ENCOUNTER — Other Ambulatory Visit: Payer: Self-pay | Admitting: Physician Assistant

## 2023-05-27 DIAGNOSIS — D509 Iron deficiency anemia, unspecified: Secondary | ICD-10-CM | POA: Diagnosis not present

## 2023-05-27 LAB — CBC WITH DIFFERENTIAL/PLATELET
Abs Immature Granulocytes: 0.01 10*3/uL (ref 0.00–0.07)
Basophils Absolute: 0 10*3/uL (ref 0.0–0.1)
Basophils Relative: 0 %
Eosinophils Absolute: 0.1 10*3/uL (ref 0.0–0.5)
Eosinophils Relative: 1 %
HCT: 43.9 % (ref 36.0–46.0)
Hemoglobin: 14.4 g/dL (ref 12.0–15.0)
Immature Granulocytes: 0 %
Lymphocytes Relative: 35 %
Lymphs Abs: 2.4 10*3/uL (ref 0.7–4.0)
MCH: 30.1 pg (ref 26.0–34.0)
MCHC: 32.8 g/dL (ref 30.0–36.0)
MCV: 91.8 fL (ref 80.0–100.0)
Monocytes Absolute: 0.5 10*3/uL (ref 0.1–1.0)
Monocytes Relative: 7 %
Neutro Abs: 3.8 10*3/uL (ref 1.7–7.7)
Neutrophils Relative %: 57 %
Platelets: 237 10*3/uL (ref 150–400)
RBC: 4.78 MIL/uL (ref 3.87–5.11)
RDW: 13.2 % (ref 11.5–15.5)
WBC: 6.8 10*3/uL (ref 4.0–10.5)
nRBC: 0 % (ref 0.0–0.2)

## 2023-05-27 LAB — IRON AND TIBC
Iron: 120 ug/dL (ref 28–170)
Saturation Ratios: 33 % — ABNORMAL HIGH (ref 10.4–31.8)
TIBC: 368 ug/dL (ref 250–450)
UIBC: 248 ug/dL

## 2023-05-27 LAB — FERRITIN: Ferritin: 84 ng/mL (ref 11–307)

## 2023-05-27 NOTE — Progress Notes (Signed)
Patient called and is experiencing severe fatigue.  Will schedule for a dose of Infed per recommendations.

## 2023-05-27 NOTE — Progress Notes (Signed)
Labs today show normal Hgb 14.4.  Iron saturation is 33%, but patient has ferritin 84. Patient reports severe worsening of her chronic fatigue. Since ferritin is <100 and she has symptoms of iron deficiency, we will order 500 mg of INFeD.  We will give premedication due to history of mild reaction (flushing) to Venofer.

## 2023-05-28 ENCOUNTER — Ambulatory Visit: Payer: Medicare Other | Admitting: Nurse Practitioner

## 2023-05-28 ENCOUNTER — Encounter: Payer: Self-pay | Admitting: *Deleted

## 2023-05-31 ENCOUNTER — Encounter: Payer: Self-pay | Admitting: Hematology

## 2023-06-01 DIAGNOSIS — H04129 Dry eye syndrome of unspecified lacrimal gland: Secondary | ICD-10-CM | POA: Diagnosis not present

## 2023-06-01 DIAGNOSIS — R5383 Other fatigue: Secondary | ICD-10-CM | POA: Diagnosis not present

## 2023-06-01 DIAGNOSIS — R682 Dry mouth, unspecified: Secondary | ICD-10-CM | POA: Diagnosis not present

## 2023-06-01 DIAGNOSIS — M79641 Pain in right hand: Secondary | ICD-10-CM | POA: Diagnosis not present

## 2023-06-05 ENCOUNTER — Encounter: Payer: Self-pay | Admitting: Family Medicine

## 2023-06-08 ENCOUNTER — Ambulatory Visit: Payer: Medicare Other | Admitting: Adult Health

## 2023-06-09 ENCOUNTER — Inpatient Hospital Stay: Payer: Medicare Other | Attending: Hematology

## 2023-06-09 VITALS — BP 101/73 | HR 89 | Temp 97.6°F | Resp 18

## 2023-06-09 DIAGNOSIS — Z79899 Other long term (current) drug therapy: Secondary | ICD-10-CM | POA: Insufficient documentation

## 2023-06-09 DIAGNOSIS — D509 Iron deficiency anemia, unspecified: Secondary | ICD-10-CM | POA: Diagnosis not present

## 2023-06-09 DIAGNOSIS — D500 Iron deficiency anemia secondary to blood loss (chronic): Secondary | ICD-10-CM

## 2023-06-09 DIAGNOSIS — R790 Abnormal level of blood mineral: Secondary | ICD-10-CM

## 2023-06-09 MED ORDER — SODIUM CHLORIDE 0.9 % IV SOLN
50.0000 mg | Freq: Once | INTRAVENOUS | Status: AC
  Administered 2023-06-09: 50 mg via INTRAVENOUS
  Filled 2023-06-09: qty 1

## 2023-06-09 MED ORDER — SODIUM CHLORIDE 0.9 % IV SOLN: Freq: Once | INTRAVENOUS | Status: AC

## 2023-06-09 MED ORDER — FAMOTIDINE IN NACL 20-0.9 MG/50ML-% IV SOLN
20.0000 mg | Freq: Once | INTRAVENOUS | Status: AC
  Administered 2023-06-09: 20 mg via INTRAVENOUS
  Filled 2023-06-09: qty 50

## 2023-06-09 MED ORDER — CETIRIZINE HCL 10 MG PO TABS
10.0000 mg | ORAL_TABLET | Freq: Once | ORAL | Status: AC
  Administered 2023-06-09: 10 mg via ORAL
  Filled 2023-06-09: qty 1

## 2023-06-09 MED ORDER — METHYLPREDNISOLONE SODIUM SUCC 125 MG IJ SOLR
125.0000 mg | Freq: Once | INTRAMUSCULAR | Status: AC
  Administered 2023-06-09: 125 mg via INTRAVENOUS
  Filled 2023-06-09: qty 2

## 2023-06-09 MED ORDER — SODIUM CHLORIDE 0.9 % IV SOLN
450.0000 mg | Freq: Once | INTRAVENOUS | Status: AC
  Administered 2023-06-09: 450 mg via INTRAVENOUS
  Filled 2023-06-09: qty 9

## 2023-06-09 MED ORDER — ACETAMINOPHEN 325 MG PO TABS
650.0000 mg | ORAL_TABLET | Freq: Once | ORAL | Status: AC
  Administered 2023-06-09: 650 mg via ORAL
  Filled 2023-06-09: qty 2

## 2023-06-09 NOTE — Progress Notes (Signed)
Patient tolerated iron infusion with no complaints voiced.  Peripheral IV site clean and dry with good blood return noted before and after infusion.  Band aid applied.  VSS with discharge and left in satisfactory condition with no s/s of distress noted.   

## 2023-06-09 NOTE — Patient Instructions (Signed)
[facility] [place]  Discharge Instructions: Thank you for choosing [facility] to provide your oncology and hematology care.  If you have a lab appointment with the Cancer Center - please note that after April 8th, 2024, all labs will be drawn in the cancer center.  You do not have to check in or register with the main entrance as you have in the past but will complete your check-in in the cancer center.  Wear comfortable clothing and clothing appropriate for easy access to any Portacath or PICC line.   We strive to give you quality time with your provider. You may need to reschedule your appointment if you arrive late (15 or more minutes).  Arriving late affects you and other patients whose appointments are after yours.  Also, if you miss three or more appointments without notifying the office, you may be dismissed from the clinic at the provider's discretion.      For prescription refill requests, have your pharmacy contact our office and allow 72 hours for refills to be completed.  To help prevent nausea and vomiting after your treatment, we encourage you to take your nausea medication as directed.  BELOW ARE SYMPTOMS THAT SHOULD BE REPORTED IMMEDIATELY: *FEVER GREATER THAN 100.4 F (38 C) OR HIGHER *CHILLS OR SWEATING *NAUSEA AND VOMITING THAT IS NOT CONTROLLED WITH YOUR NAUSEA MEDICATION *UNUSUAL SHORTNESS OF BREATH *UNUSUAL BRUISING OR BLEEDING *URINARY PROBLEMS (pain or burning when urinating, or frequent urination) *BOWEL PROBLEMS (unusual diarrhea, constipation, pain near the anus) TENDERNESS IN MOUTH AND THROAT WITH OR WITHOUT PRESENCE OF ULCERS (sore throat, sores in mouth, or a toothache) UNUSUAL RASH, SWELLING OR PAIN  UNUSUAL VAGINAL DISCHARGE OR ITCHING   Items with * indicate a potential emergency and should be followed up as soon as possible or go to the Emergency Department if any problems should occur.  Please show the CHEMOTHERAPY ALERT CARD or  IMMUNOTHERAPY ALERT CARD at check-in to the Emergency Department and triage nurse.  Should you have questions after your visit or need to cancel or reschedule your appointment, please contact [facility] [phone]  and follow the prompts.  Office hours are 8:00 a.m. to 4:30 p.m. Monday - Friday. Please note that voicemails left after 4:00 p.m. may not be returned until the following business day.  We are closed weekends and major holidays. You have access to a nurse at all times for urgent questions. Please call the main number to the clinic [phone] and follow the prompts.  For any non-urgent questions, you may also contact your provider using MyChart. We now offer e-Visits for anyone 18 and older to request care online for non-urgent symptoms. For details visit [email or web address].   Also download the MyChart app! Go to the app store, search "MyChart", open the app, select [place], and log in with your MyChart username and password.   

## 2023-06-10 DIAGNOSIS — K3184 Gastroparesis: Secondary | ICD-10-CM | POA: Diagnosis not present

## 2023-06-10 DIAGNOSIS — Z79899 Other long term (current) drug therapy: Secondary | ICD-10-CM | POA: Diagnosis not present

## 2023-06-10 DIAGNOSIS — R030 Elevated blood-pressure reading, without diagnosis of hypertension: Secondary | ICD-10-CM | POA: Diagnosis not present

## 2023-06-10 DIAGNOSIS — R1084 Generalized abdominal pain: Secondary | ICD-10-CM | POA: Diagnosis not present

## 2023-06-10 DIAGNOSIS — Z6834 Body mass index (BMI) 34.0-34.9, adult: Secondary | ICD-10-CM | POA: Diagnosis not present

## 2023-06-10 DIAGNOSIS — G894 Chronic pain syndrome: Secondary | ICD-10-CM | POA: Diagnosis not present

## 2023-06-11 ENCOUNTER — Other Ambulatory Visit: Payer: Self-pay | Admitting: Family Medicine

## 2023-06-17 ENCOUNTER — Ambulatory Visit: Payer: Medicare Other | Admitting: Family Medicine

## 2023-06-21 ENCOUNTER — Other Ambulatory Visit: Payer: Self-pay | Admitting: Family Medicine

## 2023-06-23 ENCOUNTER — Encounter: Payer: Self-pay | Admitting: Family Medicine

## 2023-06-23 ENCOUNTER — Other Ambulatory Visit: Payer: Self-pay

## 2023-06-23 MED ORDER — CYCLOBENZAPRINE HCL 10 MG PO TABS: 10.0000 mg | ORAL_TABLET | Freq: Two times a day (BID) | ORAL | 0 refills | Status: DC | PRN

## 2023-06-23 NOTE — Telephone Encounter (Signed)
Needs office vist

## 2023-06-25 ENCOUNTER — Ambulatory Visit: Payer: Medicare Other

## 2023-06-25 ENCOUNTER — Other Ambulatory Visit: Payer: Self-pay | Admitting: Family Medicine

## 2023-06-25 DIAGNOSIS — E038 Other specified hypothyroidism: Secondary | ICD-10-CM

## 2023-06-30 ENCOUNTER — Ambulatory Visit (HOSPITAL_COMMUNITY): Admission: RE | Admit: 2023-06-30 | Payer: Medicare Other | Source: Ambulatory Visit

## 2023-06-30 ENCOUNTER — Other Ambulatory Visit (HOSPITAL_COMMUNITY): Payer: Medicare Other

## 2023-07-05 ENCOUNTER — Ambulatory Visit: Payer: Medicare Other | Admitting: Nurse Practitioner

## 2023-07-06 ENCOUNTER — Ambulatory Visit
Admission: RE | Admit: 2023-07-06 | Discharge: 2023-07-06 | Disposition: A | Discharge status: Home / Self Care | Payer: Medicare Other | Source: Ambulatory Visit | Attending: Family Medicine | Admitting: Family Medicine

## 2023-07-06 DIAGNOSIS — Z1231 Encounter for screening mammogram for malignant neoplasm of breast: Secondary | ICD-10-CM | POA: Diagnosis not present

## 2023-07-08 ENCOUNTER — Ambulatory Visit: Payer: Medicare Other | Admitting: Adult Health

## 2023-07-09 ENCOUNTER — Encounter: Payer: Self-pay | Admitting: Family Medicine

## 2023-07-09 ENCOUNTER — Ambulatory Visit (INDEPENDENT_AMBULATORY_CARE_PROVIDER_SITE_OTHER): Payer: Medicare Other | Admitting: Family Medicine

## 2023-07-09 ENCOUNTER — Other Ambulatory Visit (HOSPITAL_COMMUNITY): Payer: Medicare Other

## 2023-07-09 VITALS — BP 108/74 | HR 85 | Ht 64.0 in | Wt 201.0 lb

## 2023-07-09 DIAGNOSIS — M79601 Pain in right arm: Secondary | ICD-10-CM

## 2023-07-09 DIAGNOSIS — F419 Anxiety disorder, unspecified: Secondary | ICD-10-CM

## 2023-07-09 DIAGNOSIS — E782 Mixed hyperlipidemia: Secondary | ICD-10-CM | POA: Diagnosis not present

## 2023-07-09 DIAGNOSIS — E66811 Obesity, class 1: Secondary | ICD-10-CM

## 2023-07-09 DIAGNOSIS — M7989 Other specified soft tissue disorders: Secondary | ICD-10-CM

## 2023-07-09 DIAGNOSIS — E785 Hyperlipidemia, unspecified: Secondary | ICD-10-CM | POA: Diagnosis not present

## 2023-07-09 DIAGNOSIS — E669 Obesity, unspecified: Secondary | ICD-10-CM

## 2023-07-09 DIAGNOSIS — E559 Vitamin D deficiency, unspecified: Secondary | ICD-10-CM | POA: Diagnosis not present

## 2023-07-09 DIAGNOSIS — E039 Hypothyroidism, unspecified: Secondary | ICD-10-CM

## 2023-07-09 DIAGNOSIS — G43909 Migraine, unspecified, not intractable, without status migrainosus: Secondary | ICD-10-CM

## 2023-07-09 DIAGNOSIS — M25531 Pain in right wrist: Secondary | ICD-10-CM | POA: Diagnosis not present

## 2023-07-09 DIAGNOSIS — M48061 Spinal stenosis, lumbar region without neurogenic claudication: Secondary | ICD-10-CM

## 2023-07-09 DIAGNOSIS — M62838 Other muscle spasm: Secondary | ICD-10-CM

## 2023-07-09 DIAGNOSIS — M5416 Radiculopathy, lumbar region: Secondary | ICD-10-CM

## 2023-07-09 MED ORDER — FUROSEMIDE 20 MG PO TABS
20.0000 mg | ORAL_TABLET | Freq: Every day | ORAL | 3 refills | Status: DC
Start: 2023-07-09 — End: 2023-11-12

## 2023-07-09 NOTE — Progress Notes (Unsigned)
   [name]     MRN: [id number]      DOB: 05/12/1961  Chief Complaint  Patient presents with   Follow-up    Follow up discuss wrist pain  C/o syncope over 1 year ago was [facility], c/o right wrist pain the no x rya done, however pt has been experiencing ongoing wrist pain went to urgent care in [place] , fracture seen needs f/u with ortho  HPI [name] is here for follow up and re-evaluation of chronic medical conditions, medication management and review of any available recent lab and radiology data.  Preventive health is updated, specifically  Cancer screening and Immunization.   Questions or concerns regarding consultations or procedures which the PT has had in the interim are  addressed. The PT denies any adverse reactions to current medications since the last visit.  There are no new concerns.  There are no specific complaints   ROS Denies recent fever or chills. Denies sinus pressure, nasal congestion, ear pain or sore throat. Denies chest congestion, productive cough or wheezing. Denies chest pains, palpitations and leg swelling Denies abdominal pain, nausea, vomiting,diarrhea or constipation.   Denies dysuria, frequency, hesitancy or incontinence. Denies joint pain, swelling and limitation in mobility. Denies headaches, seizures, numbness, or tingling. Denies depression, anxiety or insomnia. Denies skin break down or rash.   PE  BP 108/74 (BP Location: Right Arm, Patient Position: Sitting, Cuff Size: Large)   Pulse 85   Ht 5\' 4"  (1.626 m)   Wt 201 lb (91.2 kg)   SpO2 93%   BMI 34.50 kg/m   Patient alert and oriented and in no cardiopulmonary distress.  HEENT: No facial asymmetry, EOMI,     Neck supple .  Chest: Clear to auscultation bilaterally.  CVS: S1, S2 no murmurs, no S3.Regular rate.  ABD: Soft non tender.   Ext: No edema  MS: Adequate ROM spine, shoulders, hips and knees.  Skin: Intact, no ulcerations or rash noted.  Psych:  Good eye contact, normal affect. Memory intact not anxious or depressed appearing.  CNS: CN 2-12 intact, power,  normal throughout.no focal deficits noted.   Assessment & Plan  No problem-specific Assessment & Plan notes found for this encounter.

## 2023-07-09 NOTE — Addendum Note (Signed)
Addended by: Jasper Riling on: 07/09/2023 03:11 PM   Modules accepted: Orders

## 2023-07-09 NOTE — Patient Instructions (Addendum)
Annual exam with MD mid October , call if you need me sooner  You are referred to vascular surgery and to orthopedics  We will message re lab results  Furosemide is prescribed  For swelling at back of neck and neck pain I will refer you to ortho to eval this as well  Control calories and yo will lose weight, not easy , but can be domne1  Thanks for choosing [facility], we consider it a privelige to serve you.

## 2023-07-10 ENCOUNTER — Encounter: Payer: Self-pay | Admitting: Family Medicine

## 2023-07-10 DIAGNOSIS — F419 Anxiety disorder, unspecified: Secondary | ICD-10-CM | POA: Insufficient documentation

## 2023-07-10 DIAGNOSIS — M79601 Pain in right arm: Secondary | ICD-10-CM | POA: Insufficient documentation

## 2023-07-10 DIAGNOSIS — M7989 Other specified soft tissue disorders: Secondary | ICD-10-CM | POA: Insufficient documentation

## 2023-07-10 DIAGNOSIS — E66811 Obesity, class 1: Secondary | ICD-10-CM | POA: Insufficient documentation

## 2023-07-10 DIAGNOSIS — E669 Obesity, unspecified: Secondary | ICD-10-CM | POA: Insufficient documentation

## 2023-07-10 DIAGNOSIS — M25531 Pain in right wrist: Secondary | ICD-10-CM | POA: Insufficient documentation

## 2023-07-10 LAB — CMP14+EGFR
ALT: 21 IU/L (ref 0–32)
AST: 22 IU/L (ref 0–40)
Albumin: 4 g/dL (ref 3.9–4.9)
Alkaline Phosphatase: 121 IU/L (ref 44–121)
BUN/Creatinine Ratio: 13 (ref 12–28)
BUN: 12 mg/dL (ref 8–27)
Bilirubin Total: <0.2 mg/dL (ref 0.0–1.2)
CO2: 27 mmol/L (ref 20–29)
Calcium: 8.9 mg/dL (ref 8.7–10.3)
Chloride: 103 mmol/L (ref 96–106)
Creatinine, Ser: 0.92 mg/dL (ref 0.57–1.00)
Globulin, Total: 2 g/dL (ref 1.5–4.5)
Glucose: 106 mg/dL — ABNORMAL HIGH (ref 70–99)
Potassium: 4.1 mmol/L (ref 3.5–5.2)
Sodium: 142 mmol/L (ref 134–144)
Total Protein: 6 g/dL (ref 6.0–8.5)
eGFR: 71 mL/min/{1.73_m2} (ref >59)

## 2023-07-10 LAB — LIPID PANEL
Chol/HDL Ratio: 4 ratio (ref 0.0–4.4)
Cholesterol, Total: 166 mg/dL (ref 100–199)
HDL: 42 mg/dL (ref >39)
LDL Chol Calc (NIH): 55 mg/dL (ref 0–99)
Triglycerides: 464 mg/dL — ABNORMAL HIGH (ref 0–149)
VLDL Cholesterol Cal: 69 mg/dL — ABNORMAL HIGH (ref 5–40)

## 2023-07-10 LAB — TSH: TSH: 1.02 u[IU]/mL (ref 0.450–4.500)

## 2023-07-10 LAB — VITAMIN D 25 HYDROXY (VIT D DEFICIENCY, FRACTURES): Vit D, 25-Hydroxy: 41.9 ng/mL (ref 30.0–100.0)

## 2023-07-10 MED ORDER — FENOFIBRATE 48 MG PO TABS: 48.0000 mg | ORAL_TABLET | Freq: Every day | ORAL | 5 refills | Status: DC

## 2023-07-10 MED ORDER — ALPRAZOLAM 1 MG PO TABS: 1.0000 mg | ORAL_TABLET | Freq: Two times a day (BID) | ORAL | 5 refills | Status: DC

## 2023-07-10 NOTE — Assessment & Plan Note (Signed)
Chronic complaint for several years, reports blue digits , numbness and tingling in  both hand. Refer vascular for re eval No arteria compromise on exam, mild left arm swelling

## 2023-07-10 NOTE — Assessment & Plan Note (Signed)
Deteriorated   Patient re-educated about  the importance of commitment to a  minimum of 150 minutes of exercise per week as able.  The importance of healthy food choices with portion control discussed, as well as eating regularly and within a 12 hour window most days. The need to choose "clean , green" food 50 to 75% of the time is discussed, as well as to make water the primary drink and set a goal of 64 ounces water daily.       07/09/2023    2:04 PM 05/25/2023    2:25 PM 04/29/2023    3:28 PM  Weight /BMI  Weight 201 lb 196 lb 189 lb 1.9 oz  Height 5\' 4"  (1.626 m) 5' 4.5" (1.638 m) 5' 4.5" (1.638 m)  BMI 34.5 kg/m2 33.12 kg/m2 31.96 kg/m2

## 2023-07-10 NOTE — Assessment & Plan Note (Signed)
Remote h/o fall more than 1 year ago,  has been told she has a fracture and c/o right wrist pain, refer ortho for eval

## 2023-07-10 NOTE — Assessment & Plan Note (Signed)
Chronic pain managed through pain clinic 

## 2023-07-10 NOTE — Assessment & Plan Note (Signed)
Controlled, no change in medication  

## 2023-07-10 NOTE — Assessment & Plan Note (Signed)
Controled on current med dose, continue same

## 2023-07-10 NOTE — Assessment & Plan Note (Signed)
Continue flexerol twice daily as needed

## 2023-07-10 NOTE — Assessment & Plan Note (Signed)
Uncontrolled , elevated tG, add fenofibrate and lowe fat intake, rept in 3 to 4 months Hyperlipidemia:Low fat diet discussed and encouraged.   Lipid Panel  Lab Results  Component Value Date   CHOL 166 07/09/2023   HDL 42 07/09/2023   LDLCALC 55 07/09/2023   LDLDIRECT 138 (H) 09/15/2012   TRIG 464 (H) 07/09/2023   CHOLHDL 4.0 07/09/2023

## 2023-07-12 ENCOUNTER — Encounter: Payer: Self-pay | Admitting: Family Medicine

## 2023-07-12 ENCOUNTER — Other Ambulatory Visit: Payer: Self-pay

## 2023-07-12 DIAGNOSIS — E782 Mixed hyperlipidemia: Secondary | ICD-10-CM

## 2023-07-12 DIAGNOSIS — R1084 Generalized abdominal pain: Secondary | ICD-10-CM | POA: Diagnosis not present

## 2023-07-12 DIAGNOSIS — Z6834 Body mass index (BMI) 34.0-34.9, adult: Secondary | ICD-10-CM | POA: Diagnosis not present

## 2023-07-12 DIAGNOSIS — E6609 Other obesity due to excess calories: Secondary | ICD-10-CM | POA: Diagnosis not present

## 2023-07-12 DIAGNOSIS — G894 Chronic pain syndrome: Secondary | ICD-10-CM | POA: Diagnosis not present

## 2023-07-12 DIAGNOSIS — R030 Elevated blood-pressure reading, without diagnosis of hypertension: Secondary | ICD-10-CM | POA: Diagnosis not present

## 2023-07-12 DIAGNOSIS — G43711 Chronic migraine without aura, intractable, with status migrainosus: Secondary | ICD-10-CM

## 2023-07-12 DIAGNOSIS — K3184 Gastroparesis: Secondary | ICD-10-CM | POA: Diagnosis not present

## 2023-07-14 ENCOUNTER — Other Ambulatory Visit: Payer: Self-pay | Admitting: Family Medicine

## 2023-07-14 ENCOUNTER — Other Ambulatory Visit (INDEPENDENT_AMBULATORY_CARE_PROVIDER_SITE_OTHER): Payer: Self-pay | Admitting: Gastroenterology

## 2023-07-14 ENCOUNTER — Encounter (INDEPENDENT_AMBULATORY_CARE_PROVIDER_SITE_OTHER): Payer: Self-pay

## 2023-07-14 NOTE — Telephone Encounter (Signed)
My Chart Message sent to the patient below.  Hello [name] hope you are well. Are you following another Gastroenterologist?  You have not been seen since January 2023 and we received a refill request on your [facility] . In order to get refills we usually have to have seen you with in the last year. Please advised if you are followed else where or if you are still seeing [place] as your primary gastroenterology office, as we will need you to set up an appointment here to receive refills. Thanks

## 2023-07-15 NOTE — Telephone Encounter (Signed)
Patient says she is still a patient here and she was sent to [facility] by Dr. Karilyn Cota and Dr. Levon Hedger. She reports that [facility] says they could not help her she does still need this medication and I transferred her to [facility] to get scheduled with any provider.

## 2023-07-16 ENCOUNTER — Encounter: Payer: Self-pay | Admitting: Neurology

## 2023-07-19 ENCOUNTER — Encounter: Payer: Self-pay | Admitting: Physician Assistant

## 2023-07-20 ENCOUNTER — Other Ambulatory Visit (HOSPITAL_BASED_OUTPATIENT_CLINIC_OR_DEPARTMENT_OTHER): Payer: Self-pay | Admitting: Internal Medicine

## 2023-08-03 ENCOUNTER — Other Ambulatory Visit: Payer: Self-pay

## 2023-08-03 DIAGNOSIS — E538 Deficiency of other specified B group vitamins: Secondary | ICD-10-CM

## 2023-08-03 DIAGNOSIS — R790 Abnormal level of blood mineral: Secondary | ICD-10-CM

## 2023-08-03 DIAGNOSIS — D509 Iron deficiency anemia, unspecified: Secondary | ICD-10-CM

## 2023-08-03 DIAGNOSIS — K90829 Short bowel syndrome, unspecified: Secondary | ICD-10-CM

## 2023-08-03 DIAGNOSIS — E559 Vitamin D deficiency, unspecified: Secondary | ICD-10-CM

## 2023-08-04 ENCOUNTER — Inpatient Hospital Stay: Payer: Medicare Other | Attending: Hematology

## 2023-08-04 DIAGNOSIS — E559 Vitamin D deficiency, unspecified: Secondary | ICD-10-CM | POA: Diagnosis not present

## 2023-08-04 DIAGNOSIS — E538 Deficiency of other specified B group vitamins: Secondary | ICD-10-CM | POA: Diagnosis not present

## 2023-08-04 DIAGNOSIS — K90829 Short bowel syndrome, unspecified: Secondary | ICD-10-CM | POA: Insufficient documentation

## 2023-08-04 DIAGNOSIS — R790 Abnormal level of blood mineral: Secondary | ICD-10-CM | POA: Diagnosis not present

## 2023-08-04 DIAGNOSIS — D509 Iron deficiency anemia, unspecified: Secondary | ICD-10-CM | POA: Insufficient documentation

## 2023-08-04 LAB — COMPREHENSIVE METABOLIC PANEL
ALT: 26 U/L (ref 0–44)
AST: 23 U/L (ref 15–41)
Albumin: 4.1 g/dL (ref 3.5–5.0)
Alkaline Phosphatase: 84 U/L (ref 38–126)
Anion gap: 10 (ref 5–15)
BUN: 15 mg/dL (ref 8–23)
CO2: 27 mmol/L (ref 22–32)
Calcium: 9.4 mg/dL (ref 8.9–10.3)
Chloride: 100 mmol/L (ref 98–111)
Creatinine, Ser: 0.98 mg/dL (ref 0.44–1.00)
GFR, Estimated: >60 mL/min (ref >60)
Glucose, Bld: 105 mg/dL — ABNORMAL HIGH (ref 70–99)
Potassium: 3.9 mmol/L (ref 3.5–5.1)
Sodium: 137 mmol/L (ref 135–145)
Total Bilirubin: 0.7 mg/dL (ref 0.3–1.2)
Total Protein: 6.7 g/dL (ref 6.5–8.1)

## 2023-08-04 LAB — CBC WITH DIFFERENTIAL/PLATELET
Abs Immature Granulocytes: 0.02 10*3/uL (ref 0.00–0.07)
Basophils Absolute: 0 10*3/uL (ref 0.0–0.1)
Basophils Relative: 0 %
Eosinophils Absolute: 0.1 10*3/uL (ref 0.0–0.5)
Eosinophils Relative: 1 %
HCT: 42.6 % (ref 36.0–46.0)
Hemoglobin: 14.2 g/dL (ref 12.0–15.0)
Immature Granulocytes: 0 %
Lymphocytes Relative: 31 %
Lymphs Abs: 2.1 10*3/uL (ref 0.7–4.0)
MCH: 30.5 pg (ref 26.0–34.0)
MCHC: 33.3 g/dL (ref 30.0–36.0)
MCV: 91.6 fL (ref 80.0–100.0)
Monocytes Absolute: 0.5 10*3/uL (ref 0.1–1.0)
Monocytes Relative: 8 %
Neutro Abs: 4.1 10*3/uL (ref 1.7–7.7)
Neutrophils Relative %: 60 %
Platelets: 204 10*3/uL (ref 150–400)
RBC: 4.65 MIL/uL (ref 3.87–5.11)
RDW: 13.2 % (ref 11.5–15.5)
WBC: 6.9 10*3/uL (ref 4.0–10.5)
nRBC: 0 % (ref 0.0–0.2)

## 2023-08-04 LAB — IRON AND TIBC
Iron: 56 ug/dL (ref 28–170)
Saturation Ratios: 16 % (ref 10.4–31.8)
TIBC: 354 ug/dL (ref 250–450)
UIBC: 298 ug/dL

## 2023-08-04 LAB — VITAMIN D 25 HYDROXY (VIT D DEFICIENCY, FRACTURES): Vit D, 25-Hydroxy: 77.1 ng/mL (ref 30–100)

## 2023-08-04 LAB — FERRITIN: Ferritin: 108 ng/mL (ref 11–307)

## 2023-08-04 LAB — VITAMIN B12: Vitamin B-12: 494 pg/mL (ref 180–914)

## 2023-08-09 ENCOUNTER — Ambulatory Visit: Payer: Medicare Other | Admitting: Orthopedic Surgery

## 2023-08-09 ENCOUNTER — Encounter (INDEPENDENT_AMBULATORY_CARE_PROVIDER_SITE_OTHER): Payer: Self-pay | Admitting: Gastroenterology

## 2023-08-09 ENCOUNTER — Ambulatory Visit (INDEPENDENT_AMBULATORY_CARE_PROVIDER_SITE_OTHER): Payer: Medicare Other | Admitting: Gastroenterology

## 2023-08-09 VITALS — BP 103/73 | HR 85 | Temp 97.5°F | Ht 64.0 in | Wt 198.2 lb

## 2023-08-09 DIAGNOSIS — K3184 Gastroparesis: Secondary | ICD-10-CM | POA: Diagnosis not present

## 2023-08-09 DIAGNOSIS — K5989 Other specified functional intestinal disorders: Secondary | ICD-10-CM

## 2023-08-09 DIAGNOSIS — K56600 Partial intestinal obstruction, unspecified as to cause: Secondary | ICD-10-CM

## 2023-08-09 DIAGNOSIS — R1114 Bilious vomiting: Secondary | ICD-10-CM | POA: Diagnosis not present

## 2023-08-09 NOTE — Progress Notes (Unsigned)
Katrinka Blazing, M.D. Gastroenterology & Hepatology [facility] Gastroenterology [address] [place], [place] [address]  Primary Care Physician: Kerri Perches, MD [address] [place] [address]  I will communicate my assessment and recommendations to the referring MD via EMR.  Problems: Severe gastroparesis, possibly postsurgically related Generalized dysmotility Intermittent small bowel obstruction, possibly related to previous surgical interventions  History of Present Illness: [name] is a 62 y.o. female with pmh of partial colectomy due to redundant colon with primary anastomosis, perforated bowel r/t gynecologic procedure complicated by enterocutaneous fistula that required multiple surgeries and multiple SBOs with recent admission for partial SBO 10/25/21, gastroparesis, small bowel dysmotility, history of pulmonary embolism and DVT, who presents for follow up of small bowel dysmotility and gastroparesis.  The patient was last seen on 12/30/2021. At that time, the patient was continued on omeprazole 40 mg twice daily and Carafate 1 g every 6 hours.  She was continued on Amitiza 24 mcg twice daily for constipation.  Patient was referred as well for evaluation at [facility] for consideration of small bowel transplantation, and also for evaluation for anastomotic dilation.  Patient reports she is feeling somewhat better. She reports feeling nauseated intermittently and sometimes vomits. Has tried to learn living with this. She is trying to eat smaller meals but it is eating more carbs than vegetables or protein, as they are easier to digest. She is taking Amitiza BID and stool softeners twice a day  to keep her stools very soft. She is using molasses enemas very seldom. However, she reports having rectal pain for multiple years for which she has been using hemorrhoid suppositories intermittently. Has had some rectal pain, which she  feels gets better with lidocaine and hemorrhoid suppositories. Is using them almost every day. Abdominal pain comes and goes intermittlently but is better than in the past. Also has occasional bloating in her abdomen.  The patient denies having any fever, chills, hematochezia, melena, hematemesis,  diarrhea, jaundice, pruritus. Has gained some weight with carb intake.  Patient reports that she was seen by Dr. Waynard Reeds at [facility] transplant clinic. She reports that she was told she was not a candidate for small bowel transplant at this point.  She is taking erithromycin 250 mg TID.Would like a refill for this.  In terms of the investigations and evaluation she has had at [facility], she underwent dilation of the colonic anastomosis with a colonic balloon dilator by Dr. Wyline Mood with dilation up to 15 mm under fluoroscopic guidance.  There was presence of an angulation at this area. Did not feel significant improvement after having this dilation.  She was also being evaluated by nutrition for gastroparesis at [facility].  Previous GES 03/04/2022 -very delayed gastric emptying.  She is taking Dilaudid 4 mg 4 times a day.  Last EGD at [facility]: 03/31/2022 1 tongue of salmon-colored mucosa between 39 to 40 cm which was biopsied (path neg for BE).  Stomach was normal with patent pylorus, this was injected with Botox, 2 mm polyp in the second portion of the duodenum which was removed with a cold snare. (Focal gastric metaplasia and inflammation, no adenoma)  Last Colonoscopy: 05/02/2021 - Preparation of the colon was fair, improved with lavage. - The examined portion of the ileum was normal. - Patent side- to- side ileo- colonic anastomosis, characterized by healthy appearing mucosa. Torsioned anastomosis. - Stool in the entire examined colon. Normal mucosa biopsied. - Non- bleeding internal hemorrhoids.  Past Medical History: Past Medical History:  Diagnosis Date   Abdominal hernia 08/14/2019   Abdominal pain  06/06/2019   Acute bronchitis 05/01/2013   Acute cystitis 06/09/2010   Qualifier: Diagnosis of  By: Lillia Mountain LPN, Brandi     Acute renal insufficiency 08/28/2011   Allergy    Phreesia 03/16/2021   Anemia    Phreesia 10/29/2020   Anxiety    Anxiety state 03/16/2008   Qualifier: Diagnosis of  By: Lind Guest     Arthritis    Phreesia 10/29/2020   Back pain with radiation 07/17/2014   Bilateral hand pain 07/27/2016   Blood transfusion without reported diagnosis    Phreesia 10/29/2020   Breast cancer (HCC)    L breast- 2006   Cancer (HCC)    Phreesia 10/29/2020   Carpal tunnel syndrome    Chronic constipation    Chronic pain syndrome    followed by [facility]---  back   Clotting disorder (HCC)    Phreesia 10/29/2020   Depression    Dermatitis 12/15/2011   Difficult intubation    Educated about COVID-19 virus infection 05/14/2019   Fall at home 01/26/2016   Family history of adverse reaction to anesthesia    MOTHER--- PONV   Fatigue 09/17/2012   Fibromyalgia    FIBROMYALGIA 03/16/2008   Qualifier: Diagnosis of  By: Lind Guest     GERD (gastroesophageal reflux disease)    Headache disorder 01/16/2013   Headache syndrome 01/26/2016   Hip pain, right 07/08/2019   History of MRSA infection    lip abscess   History of ovarian cyst 06/2011   s/p  BSO   History of pulmonary embolus (PE) 1997   post EP with ablation pulmonary veouns for SVT/ Atrial Fib.   History of supraventricular tachycardia    s/p  ablation 1996  and 1997  by dr Graciela Husbands   History of TIA (transient ischemic attack) 1997   post op EP ablation PE   Hyperlipidemia    Hyperlipidemia LDL goal <100 03/16/2008   Qualifier: Diagnosis of  By: Lind Guest     Hypothyroidism    followed by pcp   Incisional hernia    Insomnia 12/15/2011   Intermittent palpitations 08/22/2017   Interstitial cystitis    09-13-2018   per pt last flare-up  May 2019 (followed by pcp)   INTERSTITIAL CYSTITIS 02/17/2011   Qualifier:  Diagnosis of  By: Lodema Hong MD, Margaret     Iron deficiency anemia    09-13-2018  PER PT STABLE   Irregular heart rate 07/25/2013   Lipoma of back    upper   Low back pain radiating to right leg 07/04/2019   Low ferritin level 06/14/2014   MDD (major depressive disorder), single episode, in full remission (HCC) 10/27/2017   Medically noncompliant 02/28/2015   Multiple missed appointments, both follow-up appointments and lab appointments.    Metabolic syndrome X 02/10/2011   Qualifier: Diagnosis of  By: Lodema Hong MD, Margaret  hBA1c is 5.8 in 02/2013    Migraines    Morbid obesity (HCC) 03/27/2013   Muscle spasm 01/03/2020   Nausea alone 07/17/2014   NECK PAIN, CHRONIC 03/16/2008   Qualifier: Diagnosis of  By: Lind Guest     Normal coronary arteries    a. by CT 12/2018.   Obesity 03/16/2008   Qualifier: Diagnosis of  By: Lind Guest     Oral ulceration 08/23/2014   Presented at 06/14/2014 visit    Other malaise and  fatigue 03/16/2008   Centricity Description: FATIGUE, CHRONIC Qualifier: Diagnosis of  By: Lind Guest   Centricity Description: FATIGUE Qualifier: Diagnosis of  By: Garnette Czech PA, Dawn     OVARIAN CYST 12/26/2009   Qualifier: Diagnosis of  By: Lodema Hong MD, Margaret     Paget disease of breast, left [facility])    Paget's disease of breast, left (HCC) 03/16/2008   Qualifier: Diagnosis of  By: Lind Guest  Left diagnosed in 2006 F/h breast cancer x 15 family members   Partial small bowel obstruction (HCC) 07/04/2012   PONV (postoperative nausea and vomiting)    SEVERE   Postsurgical menopause 11/13/2011   Presence of IVC filter 06/06/2019   PSVT (paroxysmal supraventricular tachycardia)    HX ABLATION 1996 AND 1997   Recurrent oral herpes simplex infection 10/13/2017   ROM (right otitis media) 01/23/2013   S/P insertion of IVC (inferior vena caval) filter 05/08/2005   [place] (non-retrievable)  /  dx 2019  a leg of filter is protruding thru the vena cava in to right L2 vertebral body  (09-13-2018  per pt having surgery to remove filter in [place])   S/P radiofrequency ablation operation for arrhythmia 1996   1996  and 1997,   SVT and Atrial Fib   Sinusitis, chronic 01/26/2016   SMALL BOWEL OBSTRUCTION, HX OF 08/07/2008   Annotation: obstruction w/ adhesions led to partial colectomy Qualifier: Diagnosis of  By: Minna Merritts     Stroke [facility])    Phreesia 10/29/2020, TIAs in the past   Swelling of hand 09/17/2012   Syncope    Tachycardia 02/03/2010   Qualifier: Diagnosis of  By: Via LPN, Lynn     Thyroid disease    Phreesia 10/29/2020   Ulcer 12/15/2011   Urinary frequency 07/18/2014   Vaginitis and vulvovaginitis 05/10/2013   Vitamin D deficiency 05/05/2015   Wears glasses     Past Surgical History: Past Surgical History:  Procedure Laterality Date   ABDOMINAL HYSTERECTOMY  1987   ANTERIOR CERVICAL DECOMP/DISCECTOMY FUSION  03-07-2002   dr elsner  @[facility]    C 4 -- 5   APPENDECTOMY  1980   AUGMENTATION MAMMAPLASTY Right 2006   BILATERAL SALPINGOOPHORECTOMY  07/24/2011   via Explor. Lap. w/ intraoperative perf. bowel repair   BIOPSY  05/02/2021   Procedure: BIOPSY;  Surgeon: Marguerita Merles, Reuel Boom, MD;  [facility];  Service: Gastroenterology;;  small bowel, mid esophagus, distal esophagus, random colon biopsies   BIOPSY  12/04/2021   Procedure: BIOPSY;  Surgeon: Lanelle Bal, DO;  [facility];  Service: Endoscopy;;   BIOPSY  03/31/2022   Procedure: BIOPSY;  Surgeon: Dolores Frame, MD;  [facility];  Service: Gastroenterology;;   BOTOX INJECTION N/A 03/31/2022   Procedure: BOTOX INJECTION;  Surgeon: Dolores Frame, MD;  [facility];  Service: Gastroenterology;  Laterality: N/A;   BREAST BIOPSY Right 2019   benign   BREAST ENHANCEMENT SURGERY Bilateral 1993   BREAST IMPLANT REMOVAL Bilateral    BREAST SURGERY N/A    Phreesia 10/29/2020   CARDIAC CATHETERIZATION  07-06-2003   dr Smitty Cords brodie    normal coronaries and LVF   CARDIAC ELECTROPHYSIOLOGY STUDY AND ABLATION  1996  and 1997   CARDIOVASCULAR STRESS TEST  11-18-2015   dr Graciela Husbands   normal nuclear study w/ no ischemia/  normal LV function and wall motion , ef 84%   CARPAL TUNNEL RELEASE Right ?   CESAREAN SECTION  1986  CESAREAN SECTION N/A    Phreesia 10/29/2020   CHOLECYSTECTOMY N/A    Phreesia 10/29/2020   COLON SURGERY     COLONOSCOPY WITH PROPOFOL N/A 05/02/2021   Procedure: COLONOSCOPY WITH PROPOFOL;  Surgeon: Dolores Frame, MD;  [facility];  Service: Gastroenterology;  Laterality: N/A;  12:30 PM   CYSTO/  HYDRODISTENTION/  INSTILATION THERAPY  MULTIPLE   ENTEROCUTANEOUS FISTULA CLOSURE  multiple   last one 2015 with small bowel resection   ESOPHAGOGASTRODUODENOSCOPY (EGD) WITH PROPOFOL N/A 05/02/2021   Procedure: ESOPHAGOGASTRODUODENOSCOPY (EGD) WITH PROPOFOL;  Surgeon: Dolores Frame, MD;  [facility];  Service: Gastroenterology;  Laterality: N/A;   ESOPHAGOGASTRODUODENOSCOPY (EGD) WITH PROPOFOL N/A 12/04/2021   Procedure: ESOPHAGOGASTRODUODENOSCOPY (EGD) WITH PROPOFOL;  Surgeon: Lanelle Bal, DO;  [facility];  Service: Endoscopy;  Laterality: N/A;   ESOPHAGOGASTRODUODENOSCOPY (EGD) WITH PROPOFOL N/A 03/31/2022   Procedure: ESOPHAGOGASTRODUODENOSCOPY (EGD) WITH PROPOFOL;  Surgeon: Dolores Frame, MD;  [facility];  Service: Gastroenterology;  Laterality: N/A;  1015   EXPLORATORY LAPAROTOMY INCISIONAL VENTRAL HERNIA REPAIR / RESECTION SMALL BOWEL  11-09-2014   @[facility]    FRACTURE SURGERY N/A    Phreesia 10/29/2020   JOINT REPLACEMENT N/A    Phreesia 10/29/2020   LIPOMA EXCISION Right 09/16/2018   Procedure: EXCISION LIPOMA UPPER BACK;  Surgeon: Berna Bue, MD;  Location: [facility];  Service: General;  Laterality: Right;   MASTECTOMY Left 2006   w/ reconstruction on left (paget's disease)  and right breast  augmentation   POLYPECTOMY  05/02/2021   Procedure: POLYPECTOMY;  Surgeon: Dolores Frame, MD;  [facility];  Service: Gastroenterology;;  gastric   POLYPECTOMY  03/31/2022   Procedure: POLYPECTOMY;  Surgeon: Dolores Frame, MD;  [facility];  Service: Gastroenterology;;   Gaspar Bidding DILATION  05/02/2021   Procedure: Gaspar Bidding DILATION;  Surgeon: Marguerita Merles, Reuel Boom, MD;  [facility];  Service: Gastroenterology;;   SMALL INTESTINE SURGERY N/A    Phreesia 10/29/2020   SPINE SURGERY N/A    Phreesia 10/29/2020   TOTAL COLECTOMY  08-04-2002    @[facility]    AND CHOLECYSTECTOMY  (colonic inertia)   TRANSTHORACIC ECHOCARDIOGRAM  02/04/2016   ef 60-65%,  grade 2 diastolic dysfunction/  mild MR   TUBAL LIGATION N/A    Phreesia 03/16/2021   VENA CAVA FILTER PLACEMENT  05/08/2005   @[facility]    [facility] (non-retrievable)   WIDE EXCISION PERIRECTAL ABSCESSES  09-22-2005   @ [facility]    Family History: Family History  Problem Relation Age of Onset   Congestive Heart Failure Mother    Diabetes Mother    Heart disease Mother    Hypertension Mother    Other Mother        ketoacidosis   Heart disease Father    Hyperlipidemia Father    Hypertension Father    Alcohol abuse Father    Congestive Heart Failure Father    Supraventricular tachycardia Daughter    Colon cancer Maternal Aunt    Breast cancer Maternal Aunt    Breast cancer Maternal Aunt    Congestive Heart Failure Maternal Aunt    Cancer Maternal Aunt    Heart attack Maternal Aunt    Breast cancer Maternal Aunt    Cancer Maternal Aunt    Rheum arthritis Maternal Aunt    Breast cancer Maternal Aunt    Breast cancer Maternal Aunt    Cancer Maternal Uncle  mets   Prostate cancer Maternal Uncle    Bone cancer Maternal Grandfather        mets   Ovarian cancer Cousin 19   Breast cancer Cousin     Social History: Social History   Tobacco Use  Smoking Status Never  Smokeless  Tobacco Never   Social History   Substance and Sexual Activity  Alcohol Use No   Social History   Substance and Sexual Activity  Drug Use No    Allergies: Allergies  Allergen Reactions   Nitrofurantoin Hives   Crestor [Rosuvastatin Calcium] Other (See Comments)    Generalized cramps   Bisacodyl Other (See Comments)    Makes patient feel like she is having cramps    Codeine Itching   Iron Sucrose Other (See Comments)    Flushing- required benadryl and solu medrol   Monosodium Glutamate Other (See Comments)    Cluster migraines    Scopolamine Hbr Other (See Comments)    Cluster migraines, impaired vision   Morphine Rash   Other Hives, Itching, Swelling, Rash and Other (See Comments)    Cigarette smoke    Medications: Current Outpatient Medications  Medication Sig Dispense Refill   ALPRAZolam (XANAX) 1 MG tablet Take 1 tablet (1 mg total) by mouth 2 (two) times daily. 60 tablet 5   aspirin 81 MG chewable tablet Chew 81 mg by mouth daily.     butalbital-acetaminophen-caffeine (FIORICET) 50-325-40 MG tablet Take 1 tablet by mouth every 6 (six) hours as needed.     Cholecalciferol (VITAMIN D3) 1.25 MG (50000 UT) CAPS Take 1 capsule by mouth every 30 (thirty) days.     conjugated estrogens (PREMARIN) vaginal cream INSERT 1 APPLICATORFUL VAGINALLY DAILY (Patient taking differently: Place 1 applicator vaginally at bedtime.) 30 g 2   cyanocobalamin (VITAMIN B12) 1000 MCG/ML injection Inject 1 mL (1,000 mcg total) into the muscle every 30 (thirty) days. 1 mL 11   cyclobenzaprine (FLEXERIL) 10 MG tablet Take 1 tablet (10 mg total) by mouth 2 (two) times daily as needed for muscle spasms. 180 tablet 0   cycloSPORINE (RESTASIS) 0.05 % ophthalmic emulsion Place 1 drop into both eyes 2 (two) times daily as needed (dry eyes).     DULoxetine (CYMBALTA) 60 MG capsule Take 60 mg by mouth daily.     erythromycin (ERY-TAB) 250 MG EC tablet TAKE 1 TABLET BY MOUTH THREE TIMES DAILY BEFORE  MEALS (Patient taking differently: 750 mg at bedtime.) 270 tablet 2   ezetimibe (ZETIA) 10 MG tablet TAKE 1 TABLET(10 MG) BY MOUTH DAILY 90 tablet 3   fenofibrate (TRICOR) 48 MG tablet Take 1 tablet (48 mg total) by mouth daily. 30 tablet 5   fluticasone (FLONASE) 50 MCG/ACT nasal spray Place 2 sprays into both nostrils daily as needed for allergies.     furosemide (LASIX) 20 MG tablet Take 1 tablet (20 mg total) by mouth daily. 30 tablet 3   HYDROmorphone (DILAUDID) 4 MG tablet Take 4 mg by mouth 3 (three) times daily.     levothyroxine (SYNTHROID) 125 MCG tablet TAKE 1 TABLET(125 MCG) BY MOUTH DAILY BEFORE BREAKFAST 90 tablet 1   lubiprostone (AMITIZA) 24 MCG capsule Take 1 capsule (24 mcg total) by mouth 2 (two) times daily with a meal. 60 capsule 0   magnesium oxide (MAG-OX) 400 (240 Mg) MG tablet TAKE 1 TABLET BY MOUTH FOUR TIMES DAILY (Patient taking differently: Take 400 mg by mouth 2 (two) times daily.) 180 tablet 0   metoprolol succinate (  TOPROL-XL) 25 MG 24 hr tablet TAKE 1 TABLET(25 MG) BY MOUTH DAILY 90 tablet 2   Multiple Vitamins-Minerals (ICAPS AREDS 2 PO) Take 1 capsule by mouth every evening.     naloxone (NARCAN) nasal spray 4 mg/0.1 mL      omeprazole (PRILOSEC) 40 MG capsule Take 1 capsule (40 mg total) by mouth in the morning and at bedtime. 180 capsule 3   OVER THE COUNTER MEDICATION daily at 6 (six) AM. Nutra-ful     phenazopyridine (PYRIDIUM) 95 MG tablet Take by mouth 3 (three) times daily as needed. sd     polyethylene glycol (MIRALAX / GLYCOLAX) 17 g packet Take by mouth.     potassium chloride (KLOR-CON M) 10 MEQ tablet Take 1 tablet (10 mEq total) by mouth 2 (two) times daily. 180 tablet 3   pravastatin (PRAVACHOL) 20 MG tablet TAKE 1 TABLET(20 MG) BY MOUTH DAILY 90 tablet 1   sennosides-docusate sodium (SENOKOT-S) 8.6-50 MG tablet 2 tablets 2 (two) times daily.     XARELTO 20 MG TABS tablet TAKE 1 TABLET(20 MG) BY MOUTH DAILY 30 tablet 1   No current  facility-administered medications for this visit.    Review of Systems: GENERAL: negative for malaise, night sweats HEENT: No changes in hearing or vision, no nose bleeds or other nasal problems. NECK: Negative for lumps, goiter, pain and significant neck swelling RESPIRATORY: Negative for cough, wheezing CARDIOVASCULAR: Negative for chest pain, leg swelling, palpitations, orthopnea GI: SEE HPI MUSCULOSKELETAL: Negative for joint pain or swelling, back pain, and muscle pain. SKIN: Negative for lesions, rash PSYCH: Negative for sleep disturbance, mood disorder and recent psychosocial stressors. HEMATOLOGY Negative for prolonged bleeding, bruising easily, and swollen nodes. ENDOCRINE: Negative for cold or heat intolerance, polyuria, polydipsia and goiter. NEURO: negative for tremor, gait imbalance, syncope and seizures. The remainder of the review of systems is noncontributory.   Physical Exam: BP 103/73 (BP Location: Right Arm, Patient Position: Sitting, Cuff Size: Large)   Pulse 85   Temp (!) 97.5 F (36.4 C) (Temporal)   Ht 5\' 4"  (1.626 m)   Wt 198 lb 3.2 oz (89.9 kg)   BMI 34.02 kg/m  GENERAL: The patient is AO x3, in no acute distress. HEENT: Head is normocephalic and atraumatic. EOMI are intact. Mouth is well hydrated and without lesions. NECK: Supple. No masses LUNGS: Clear to auscultation. No presence of rhonchi/wheezing/rales. Adequate chest expansion HEART: RRR, normal s1 and s2. ABDOMEN: Soft, nontender, no guarding, no peritoneal signs, and nondistended. BS +. No masses. EXTREMITIES: Without any cyanosis, clubbing, rash, lesions or edema. NEUROLOGIC: AOx3, no focal motor deficit. SKIN: no jaundice, no rashes  Imaging/Labs: as above  I personally reviewed and interpreted the available labs, imaging and endoscopic files.  Impression and Plan: [name] is a 62 y.o. female with pmh of partial colectomy due to redundant colon with primary anastomosis,  perforated bowel r/t gynecologic procedure complicated by enterocutaneous fistula that required multiple surgeries and multiple SBOs with recent admission for partial SBO 10/25/21, gastroparesis, small bowel dysmotility, history of pulmonary embolism and DVT, who presents for follow up of small bowel dysmotility and gastroparesis.  The patient has presented a very complicated history of gastrointestinal complaints after undergoing a complex surgery and complications from this.  Multiple investigations have been performed and so far she has been found to have severe gastroparesis, which I consider this is related to postsurgical changes.  She has been able to manage her symptoms with a combination of erythromycin  and Amitiza.  She feels at her best at the moment.  I encouraged her to continue with the same routine she is doing right now, as well as to take the stool softeners she is taking twice a day.  She will need to eat smaller portions through the day to decrease the gastric symptoms she has presented.  -Continue erythromycin 250 mg three times a day -Continue Amitiza 24 mcg twice a day -Continue stool softeners twice a day -Try to eat small portions 6-8 times a day - focus more on protein based meals and less carbs -Can use hemorrhoidal suppositories as needed   All questions were answered.      Katrinka Blazing, MD Gastroenterology and Hepatology [facility]

## 2023-08-09 NOTE — Patient Instructions (Signed)
Continue erythromycin 250 mg three times a day Continue Amitiza 24 mcg twice a day Continue stool softeners twice a day Try to eat small portions 6-8 times a day - focus more on protein based meals and less carbs Can use hemorrhoidal suppositories as needed

## 2023-08-10 DIAGNOSIS — G894 Chronic pain syndrome: Secondary | ICD-10-CM | POA: Diagnosis not present

## 2023-08-10 DIAGNOSIS — R1084 Generalized abdominal pain: Secondary | ICD-10-CM | POA: Diagnosis not present

## 2023-08-10 DIAGNOSIS — Z6834 Body mass index (BMI) 34.0-34.9, adult: Secondary | ICD-10-CM | POA: Diagnosis not present

## 2023-08-10 DIAGNOSIS — E6609 Other obesity due to excess calories: Secondary | ICD-10-CM | POA: Diagnosis not present

## 2023-08-10 DIAGNOSIS — K3184 Gastroparesis: Secondary | ICD-10-CM | POA: Diagnosis not present

## 2023-08-11 ENCOUNTER — Ambulatory Visit (HOSPITAL_COMMUNITY)
Admission: RE | Admit: 2023-08-11 | Discharge: 2023-08-11 | Disposition: A | Discharge status: Home / Self Care | Payer: Medicare Other | Source: Ambulatory Visit | Attending: Family | Admitting: Family

## 2023-08-11 DIAGNOSIS — R002 Palpitations: Secondary | ICD-10-CM

## 2023-08-11 DIAGNOSIS — R072 Precordial pain: Secondary | ICD-10-CM

## 2023-08-11 DIAGNOSIS — I471 Supraventricular tachycardia, unspecified: Secondary | ICD-10-CM

## 2023-08-13 ENCOUNTER — Inpatient Hospital Stay: Payer: Medicare Other

## 2023-08-17 ENCOUNTER — Other Ambulatory Visit (INDEPENDENT_AMBULATORY_CARE_PROVIDER_SITE_OTHER): Payer: Self-pay | Admitting: Gastroenterology

## 2023-08-17 ENCOUNTER — Ambulatory Visit: Payer: Medicare Other | Admitting: Nurse Practitioner

## 2023-08-18 ENCOUNTER — Other Ambulatory Visit: Payer: Self-pay | Admitting: *Deleted

## 2023-08-18 DIAGNOSIS — M79601 Pain in right arm: Secondary | ICD-10-CM

## 2023-08-20 ENCOUNTER — Inpatient Hospital Stay: Payer: Medicare Other | Admitting: Oncology

## 2023-08-23 ENCOUNTER — Ambulatory Visit: Payer: Medicare Other | Admitting: Adult Health

## 2023-08-26 ENCOUNTER — Encounter: Payer: Self-pay | Admitting: Vascular Surgery

## 2023-08-26 ENCOUNTER — Ambulatory Visit (HOSPITAL_COMMUNITY)
Admission: RE | Admit: 2023-08-26 | Discharge: 2023-08-26 | Disposition: A | Discharge status: Home / Self Care | Payer: Medicare Other | Source: Ambulatory Visit | Attending: Vascular Surgery | Admitting: Vascular Surgery

## 2023-08-26 ENCOUNTER — Ambulatory Visit: Payer: Medicare Other | Admitting: Vascular Surgery

## 2023-08-26 ENCOUNTER — Other Ambulatory Visit: Payer: Self-pay | Admitting: Vascular Surgery

## 2023-08-26 VITALS — BP 103/72 | HR 87 | Temp 98.0°F | Resp 20 | Ht 64.0 in | Wt 191.0 lb

## 2023-08-26 DIAGNOSIS — M79601 Pain in right arm: Secondary | ICD-10-CM | POA: Diagnosis not present

## 2023-08-26 DIAGNOSIS — M7989 Other specified soft tissue disorders: Secondary | ICD-10-CM | POA: Diagnosis not present

## 2023-08-26 NOTE — Progress Notes (Signed)
ASSESSMENT & PLAN   CHRONIC RIGHT UPPER EXTREMITY SWELLING: This patient has had chronic right upper extremity swelling for many years.  She also notes some venous congestion in the arm at times.  Her venous duplex scan today shows no evidence of DVT.  She has no evidence of arterial insufficiency.  She has had no previous surgery in the axilla or radiation to suggest the reason for her lymphedema.  The only other consideration I have is that she could potentially have a venous thoracic outlet syndrome.  We did look with the venous duplex scan today and there did appear to be some drop-off in the venous flow with abduction and external rotation of the shoulder.  I think the next logical step would be a right upper extremity venogram with provocative maneuvers.  I have explained that if this does show evidence of thoracic outlet syndrome she could be considered for first rib resection.  However, this patient has had multiple operations and we would not necessarily have to recommend an aggressive approach to this.  Regardless she would like to proceed with a venogram to help determine the etiology of her arm swelling.  I will make further recommendations pending results of her venogram.  REASON FOR CONSULT:    Chronic right upper extremity swelling and discoloration of the digits.  The consult is requested by Dr. Syliva Overman.   HPI:   [name] is a 62 y.o. female who is referred with right upper extremity swelling.  I have reviewed the records from the referring office.  The patient was seen by Dr. Lodema Hong on 07/09/2023.  She was complaining of right upper extremity swelling which she had had for years.  She states that this was worsening and that also some of her fingers would turn blue at times.  She was concerned she might have a blood clot and the patient requested vascular consultation.  On my history, the patient did have reportedly a right upper extremity DVT in the remote past.   She had a pulmonary embolus in 1997.  In 2006 she had surgery for breast cancer and a Greenfield filter was placed.  Ultimately she had fracture of some stents and several stents protruded into the vertebral bodies.  She went to [facility] where they tried to remove the filter but did not think they could do that safely.  She has had multiple operations on her abdomen.  She has had some chest pain and is scheduled for an echocardiogram in September.  She is right-handed.  She does note some paresthesias in her arm with external rotation and abduction.  She is on Xarelto.  Past Medical History:  Diagnosis Date   Abdominal hernia 08/14/2019   Abdominal pain 06/06/2019   Acute bronchitis 05/01/2013   Acute cystitis 06/09/2010   Qualifier: Diagnosis of  By: Lillia Mountain LPN, Brandi     Acute renal insufficiency 08/28/2011   Allergy    Phreesia 03/16/2021   Anemia    Phreesia 10/29/2020   Anxiety    Anxiety state 03/16/2008   Qualifier: Diagnosis of  By: Lind Guest     Arthritis    Phreesia 10/29/2020   Back pain with radiation 07/17/2014   Bilateral hand pain 07/27/2016   Blood transfusion without reported diagnosis    Phreesia 10/29/2020   Breast cancer (HCC)    L breast- 2006   Cancer [facility])    Phreesia 10/29/2020   Carpal tunnel syndrome    Chronic constipation  Chronic pain syndrome    followed by [facility]---  back   Clotting disorder (HCC)    Phreesia 10/29/2020   Depression    Dermatitis 12/15/2011   Difficult intubation    Educated about COVID-19 virus infection 05/14/2019   Fall at home 01/26/2016   Family history of adverse reaction to anesthesia    MOTHER--- PONV   Fatigue 09/17/2012   Fibromyalgia    FIBROMYALGIA 03/16/2008   Qualifier: Diagnosis of  By: Lind Guest     GERD (gastroesophageal reflux disease)    Headache disorder 01/16/2013   Headache syndrome 01/26/2016   Hip pain, right 07/08/2019   History of MRSA infection    lip abscess   History of  ovarian cyst 06/2011   s/p  BSO   History of pulmonary embolus (PE) 1997   post EP with ablation pulmonary veouns for SVT/ Atrial Fib.   History of supraventricular tachycardia    s/p  ablation 1996  and 1997  by dr Graciela Husbands   History of TIA (transient ischemic attack) 1997   post op EP ablation PE   Hyperlipidemia    Hyperlipidemia LDL goal <100 03/16/2008   Qualifier: Diagnosis of  By: Lind Guest     Hypothyroidism    followed by pcp   Incisional hernia    Insomnia 12/15/2011   Intermittent palpitations 08/22/2017   Interstitial cystitis    09-13-2018   per pt last flare-up  May 2019 (followed by pcp)   INTERSTITIAL CYSTITIS 02/17/2011   Qualifier: Diagnosis of  By: Lodema Hong MD, Margaret     Iron deficiency anemia    09-13-2018  PER PT STABLE   Irregular heart rate 07/25/2013   Lipoma of back    upper   Low back pain radiating to right leg 07/04/2019   Low ferritin level 06/14/2014   MDD (major depressive disorder), single episode, in full remission (HCC) 10/27/2017   Medically noncompliant 02/28/2015   Multiple missed appointments, both follow-up appointments and lab appointments.    Metabolic syndrome X 02/10/2011   Qualifier: Diagnosis of  By: Lodema Hong MD, Margaret  hBA1c is 5.8 in 02/2013    Migraines    Morbid obesity (HCC) 03/27/2013   Muscle spasm 01/03/2020   Nausea alone 07/17/2014   NECK PAIN, CHRONIC 03/16/2008   Qualifier: Diagnosis of  By: Lind Guest     Normal coronary arteries    a. by CT 12/2018.   Obesity 03/16/2008   Qualifier: Diagnosis of  By: Lind Guest     Oral ulceration 08/23/2014   Presented at 06/14/2014 visit    Other malaise and fatigue 03/16/2008   Centricity Description: FATIGUE, CHRONIC Qualifier: Diagnosis of  By: Lind Guest   Centricity Description: FATIGUE Qualifier: Diagnosis of  By: Garnette Czech PA, Dawn     OVARIAN CYST 12/26/2009   Qualifier: Diagnosis of  By: Lodema Hong MD, Margaret     Paget disease of breast, left [facility])    Paget's disease of  breast, left (HCC) 03/16/2008   Qualifier: Diagnosis of  By: Lind Guest  Left diagnosed in 2006 F/h breast cancer x 15 family members   Partial small bowel obstruction (HCC) 07/04/2012   PONV (postoperative nausea and vomiting)    SEVERE   Postsurgical menopause 11/13/2011   Presence of IVC filter 06/06/2019   PSVT (paroxysmal supraventricular tachycardia)    HX ABLATION 1996 AND 1997   Recurrent oral herpes simplex infection 10/13/2017   ROM (right otitis media) 01/23/2013   S/P  insertion of IVC (inferior vena caval) filter 05/08/2005   greenfield (non-retrievable)  /  dx 2019  a leg of filter is protruding thru the vena cava in to right L2 vertebral body (09-13-2018  per pt having surgery to remove filter in [place])   S/P radiofrequency ablation operation for arrhythmia 1996   1996  and 1997,   SVT and Atrial Fib   Sinusitis, chronic 01/26/2016   SMALL BOWEL OBSTRUCTION, HX OF 08/07/2008   Annotation: obstruction w/ adhesions led to partial colectomy Qualifier: Diagnosis of  By: Minna Merritts     Stroke [facility])    Phreesia 10/29/2020, TIAs in the past   Swelling of hand 09/17/2012   Syncope    Tachycardia 02/03/2010   Qualifier: Diagnosis of  By: Via LPN, Lynn     Thyroid disease    Phreesia 10/29/2020   Ulcer 12/15/2011   Urinary frequency 07/18/2014   Vaginitis and vulvovaginitis 05/10/2013   Vitamin D deficiency 05/05/2015   Wears glasses     Family History  Problem Relation Age of Onset   Congestive Heart Failure Mother    Diabetes Mother    Heart disease Mother    Hypertension Mother    Other Mother        ketoacidosis   Heart disease Father    Hyperlipidemia Father    Hypertension Father    Alcohol abuse Father    Congestive Heart Failure Father    Supraventricular tachycardia Daughter    Colon cancer Maternal Aunt    Breast cancer Maternal Aunt    Breast cancer Maternal Aunt    Congestive Heart Failure Maternal Aunt    Cancer Maternal Aunt    Heart attack Maternal  Aunt    Breast cancer Maternal Aunt    Cancer Maternal Aunt    Rheum arthritis Maternal Aunt    Breast cancer Maternal Aunt    Breast cancer Maternal Aunt    Cancer Maternal Uncle        mets   Prostate cancer Maternal Uncle    Bone cancer Maternal Grandfather        mets   Ovarian cancer Cousin 19   Breast cancer Cousin     SOCIAL HISTORY: Social History   Tobacco Use   Smoking status: Never   Smokeless tobacco: Never  Substance Use Topics   Alcohol use: No    Allergies  Allergen Reactions   Nitrofurantoin Hives   Crestor [Rosuvastatin Calcium] Other (See Comments)    Generalized cramps   Bisacodyl Other (See Comments)    Makes patient feel like she is having cramps    Codeine Itching   Iron Sucrose Other (See Comments)    Flushing- required benadryl and solu medrol   Monosodium Glutamate Other (See Comments)    Cluster migraines    Scopolamine Hbr Other (See Comments)    Cluster migraines, impaired vision   Morphine Rash   Other Hives, Itching, Swelling, Rash and Other (See Comments)    Cigarette smoke    Current Outpatient Medications  Medication Sig Dispense Refill   ALPRAZolam (XANAX) 1 MG tablet Take 1 tablet (1 mg total) by mouth 2 (two) times daily. 60 tablet 5   aspirin 81 MG chewable tablet Chew 81 mg by mouth daily.     butalbital-acetaminophen-caffeine (FIORICET) 50-325-40 MG tablet Take 1 tablet by mouth every 6 (six) hours as needed.     Cholecalciferol (VITAMIN D3) 1.25 MG (50000 UT) CAPS Take 1 capsule by mouth every 30 (  thirty) days.     conjugated estrogens (PREMARIN) vaginal cream INSERT 1 APPLICATORFUL VAGINALLY DAILY (Patient taking differently: Place 1 applicator vaginally at bedtime.) 30 g 2   cyanocobalamin (VITAMIN B12) 1000 MCG/ML injection Inject 1 mL (1,000 mcg total) into the muscle every 30 (thirty) days. 1 mL 11   cyclobenzaprine (FLEXERIL) 10 MG tablet Take 1 tablet (10 mg total) by mouth 2 (two) times daily as needed for muscle  spasms. 180 tablet 0   cycloSPORINE (RESTASIS) 0.05 % ophthalmic emulsion Place 1 drop into both eyes 2 (two) times daily as needed (dry eyes).     DULoxetine (CYMBALTA) 60 MG capsule Take 60 mg by mouth daily.     erythromycin (ERY-TAB) 250 MG EC tablet TAKE 1 TABLET BY MOUTH THREE TIMES DAILY BEFORE MEALS (Patient taking differently: 750 mg at bedtime.) 270 tablet 2   ezetimibe (ZETIA) 10 MG tablet TAKE 1 TABLET(10 MG) BY MOUTH DAILY 90 tablet 3   fenofibrate (TRICOR) 48 MG tablet Take 1 tablet (48 mg total) by mouth daily. 30 tablet 5   fluticasone (FLONASE) 50 MCG/ACT nasal spray Place 2 sprays into both nostrils daily as needed for allergies.     furosemide (LASIX) 20 MG tablet Take 1 tablet (20 mg total) by mouth daily. 30 tablet 3   HYDROmorphone (DILAUDID) 4 MG tablet Take 4 mg by mouth 3 (three) times daily.     levothyroxine (SYNTHROID) 125 MCG tablet TAKE 1 TABLET(125 MCG) BY MOUTH DAILY BEFORE BREAKFAST 90 tablet 1   lubiprostone (AMITIZA) 24 MCG capsule TAKE 1 CAPSULE(24 MCG) BY MOUTH TWICE DAILY WITH A MEAL 60 capsule 5   magnesium oxide (MAG-OX) 400 (240 Mg) MG tablet TAKE 1 TABLET BY MOUTH FOUR TIMES DAILY (Patient taking differently: Take 400 mg by mouth 2 (two) times daily.) 180 tablet 0   metoprolol succinate (TOPROL-XL) 25 MG 24 hr tablet TAKE 1 TABLET(25 MG) BY MOUTH DAILY 90 tablet 2   Multiple Vitamins-Minerals (ICAPS AREDS 2 PO) Take 1 capsule by mouth every evening.     naloxone (NARCAN) nasal spray 4 mg/0.1 mL      omeprazole (PRILOSEC) 40 MG capsule Take 1 capsule (40 mg total) by mouth in the morning and at bedtime. 180 capsule 3   OVER THE COUNTER MEDICATION daily at 6 (six) AM. Nutra-ful     phenazopyridine (PYRIDIUM) 95 MG tablet Take by mouth 3 (three) times daily as needed. sd     polyethylene glycol (MIRALAX / GLYCOLAX) 17 g packet Take by mouth.     potassium chloride (KLOR-CON M) 10 MEQ tablet Take 1 tablet (10 mEq total) by mouth 2 (two) times daily. 180 tablet  3   pravastatin (PRAVACHOL) 20 MG tablet TAKE 1 TABLET(20 MG) BY MOUTH DAILY 90 tablet 1   sennosides-docusate sodium (SENOKOT-S) 8.6-50 MG tablet 2 tablets 2 (two) times daily.     XARELTO 20 MG TABS tablet TAKE 1 TABLET(20 MG) BY MOUTH DAILY 30 tablet 1   No current facility-administered medications for this visit.    REVIEW OF SYSTEMS:  [X]  denotes positive finding, [ ]  denotes negative finding Cardiac  Comments:  Chest pain or chest pressure:    Shortness of breath upon exertion:    Short of breath when lying flat:    Irregular heart rhythm:        Vascular    Pain in calf, thigh, or hip brought on by ambulation:    Pain in feet at night that wakes you up  from your sleep:     Blood clot in your veins:    Arm swelling:  x       Pulmonary    Oxygen at home:    Productive cough:     Wheezing:         Neurologic    Sudden weakness in arms or legs:     Sudden numbness in arms or legs:     Sudden onset of difficulty speaking or slurred speech:    Temporary loss of vision in one eye:     Problems with dizziness:         Gastrointestinal    Blood in stool:     Vomited blood:         Genitourinary    Burning when urinating:     Blood in urine:        Psychiatric    Major depression:         Hematologic    Bleeding problems:    Problems with blood clotting too easily:        Skin    Rashes or ulcers:        Constitutional    Fever or chills:    -  PHYSICAL EXAM:   Vitals:   08/26/23 1536  BP: 103/72  Pulse: 87  Resp: 20  Temp: 98 F (36.7 C)  Weight: 191 lb (86.6 kg)  Height: 5\' 4"  (1.626 m)   Body mass index is 32.79 kg/m. GENERAL: The patient is a well-nourished female, in no acute distress. The vital signs are documented above. CARDIAC: There is a regular rate and rhythm.  VASCULAR: I do not detect carotid bruits. She has palpable radial pulses. I can still feel her radial pulses with abduction and external rotation of the arm. The right arm is  slightly swollen with some slight venous congestion. PULMONARY: There is good air exchange bilaterally without wheezing or rales. ABDOMEN: Soft and non-tender with normal pitched bowel sounds.  MUSCULOSKELETAL: There are no major deformities. NEUROLOGIC: No focal weakness or paresthesias are detected. SKIN: There are no ulcers or rashes noted. PSYCHIATRIC: The patient has a normal affect.  DATA:    RIGHT UPPER EXTREMITY VENOUS DUPLEX: I have independently interpreted her right upper extremity venous duplex scan.  There is no evidence of DVT or superficial venous thrombosis.  She has triphasic radial and ulnar signals with the Doppler.  Waverly Ferrari [facility]

## 2023-08-26 NOTE — H&P (View-Only) (Signed)
ASSESSMENT & PLAN   CHRONIC RIGHT UPPER EXTREMITY SWELLING: This patient has had chronic right upper extremity swelling for many years.  She also notes some venous congestion in the arm at times.  Her venous duplex scan today shows no evidence of DVT.  She has no evidence of arterial insufficiency.  She has had no previous surgery in the axilla or radiation to suggest the reason for her lymphedema.  The only other consideration I have is that she could potentially have a venous thoracic outlet syndrome.  We did look with the venous duplex scan today and there did appear to be some drop-off in the venous flow with abduction and external rotation of the shoulder.  I think the next logical step would be a right upper extremity venogram with provocative maneuvers.  I have explained that if this does show evidence of thoracic outlet syndrome she could be considered for first rib resection.  However, this patient has had multiple operations and we would not necessarily have to recommend an aggressive approach to this.  Regardless she would like to proceed with a venogram to help determine the etiology of her arm swelling.  I will make further recommendations pending results of her venogram.  REASON FOR CONSULT:    Chronic right upper extremity swelling and discoloration of the digits.  The consult is requested by Dr. Syliva Overman.   HPI:   [name] is a 62 y.o. female who is referred with right upper extremity swelling.  I have reviewed the records from the referring office.  The patient was seen by Dr. Lodema Hong on 07/09/2023.  She was complaining of right upper extremity swelling which she had had for years.  She states that this was worsening and that also some of her fingers would turn blue at times.  She was concerned she might have a blood clot and the patient requested vascular consultation.  On my history, the patient did have reportedly a right upper extremity DVT in the remote past.   She had a pulmonary embolus in 1997.  In 2006 she had surgery for breast cancer and a Greenfield filter was placed.  Ultimately she had fracture of some stents and several stents protruded into the vertebral bodies.  She went to [facility] where they tried to remove the filter but did not think they could do that safely.  She has had multiple operations on her abdomen.  She has had some chest pain and is scheduled for an echocardiogram in September.  She is right-handed.  She does note some paresthesias in her arm with external rotation and abduction.  She is on Xarelto.  Past Medical History:  Diagnosis Date   Abdominal hernia 08/14/2019   Abdominal pain 06/06/2019   Acute bronchitis 05/01/2013   Acute cystitis 06/09/2010   Qualifier: Diagnosis of  By: Lillia Mountain LPN, Brandi     Acute renal insufficiency 08/28/2011   Allergy    Phreesia 03/16/2021   Anemia    Phreesia 10/29/2020   Anxiety    Anxiety state 03/16/2008   Qualifier: Diagnosis of  By: Lind Guest     Arthritis    Phreesia 10/29/2020   Back pain with radiation 07/17/2014   Bilateral hand pain 07/27/2016   Blood transfusion without reported diagnosis    Phreesia 10/29/2020   Breast cancer (HCC)    L breast- 2006   Cancer [facility])    Phreesia 10/29/2020   Carpal tunnel syndrome    Chronic constipation  Chronic pain syndrome    followed by [facility]---  back   Clotting disorder (HCC)    Phreesia 10/29/2020   Depression    Dermatitis 12/15/2011   Difficult intubation    Educated about COVID-19 virus infection 05/14/2019   Fall at home 01/26/2016   Family history of adverse reaction to anesthesia    MOTHER--- PONV   Fatigue 09/17/2012   Fibromyalgia    FIBROMYALGIA 03/16/2008   Qualifier: Diagnosis of  By: Lind Guest     GERD (gastroesophageal reflux disease)    Headache disorder 01/16/2013   Headache syndrome 01/26/2016   Hip pain, right 07/08/2019   History of MRSA infection    lip abscess   History of  ovarian cyst 06/2011   s/p  BSO   History of pulmonary embolus (PE) 1997   post EP with ablation pulmonary veouns for SVT/ Atrial Fib.   History of supraventricular tachycardia    s/p  ablation 1996  and 1997  by dr Graciela Husbands   History of TIA (transient ischemic attack) 1997   post op EP ablation PE   Hyperlipidemia    Hyperlipidemia LDL goal <100 03/16/2008   Qualifier: Diagnosis of  By: Lind Guest     Hypothyroidism    followed by pcp   Incisional hernia    Insomnia 12/15/2011   Intermittent palpitations 08/22/2017   Interstitial cystitis    09-13-2018   per pt last flare-up  May 2019 (followed by pcp)   INTERSTITIAL CYSTITIS 02/17/2011   Qualifier: Diagnosis of  By: Lodema Hong MD, Margaret     Iron deficiency anemia    09-13-2018  PER PT STABLE   Irregular heart rate 07/25/2013   Lipoma of back    upper   Low back pain radiating to right leg 07/04/2019   Low ferritin level 06/14/2014   MDD (major depressive disorder), single episode, in full remission (HCC) 10/27/2017   Medically noncompliant 02/28/2015   Multiple missed appointments, both follow-up appointments and lab appointments.    Metabolic syndrome X 02/10/2011   Qualifier: Diagnosis of  By: Lodema Hong MD, Margaret  hBA1c is 5.8 in 02/2013    Migraines    Morbid obesity (HCC) 03/27/2013   Muscle spasm 01/03/2020   Nausea alone 07/17/2014   NECK PAIN, CHRONIC 03/16/2008   Qualifier: Diagnosis of  By: Lind Guest     Normal coronary arteries    a. by CT 12/2018.   Obesity 03/16/2008   Qualifier: Diagnosis of  By: Lind Guest     Oral ulceration 08/23/2014   Presented at 06/14/2014 visit    Other malaise and fatigue 03/16/2008   Centricity Description: FATIGUE, CHRONIC Qualifier: Diagnosis of  By: Lind Guest   Centricity Description: FATIGUE Qualifier: Diagnosis of  By: Garnette Czech PA, Dawn     OVARIAN CYST 12/26/2009   Qualifier: Diagnosis of  By: Lodema Hong MD, Margaret     Paget disease of breast, left [facility])    Paget's disease of  breast, left (HCC) 03/16/2008   Qualifier: Diagnosis of  By: Lind Guest  Left diagnosed in 2006 F/h breast cancer x 15 family members   Partial small bowel obstruction (HCC) 07/04/2012   PONV (postoperative nausea and vomiting)    SEVERE   Postsurgical menopause 11/13/2011   Presence of IVC filter 06/06/2019   PSVT (paroxysmal supraventricular tachycardia)    HX ABLATION 1996 AND 1997   Recurrent oral herpes simplex infection 10/13/2017   ROM (right otitis media) 01/23/2013   S/P  insertion of IVC (inferior vena caval) filter 05/08/2005   greenfield (non-retrievable)  /  dx 2019  a leg of filter is protruding thru the vena cava in to right L2 vertebral body (09-13-2018  per pt having surgery to remove filter in [place])   S/P radiofrequency ablation operation for arrhythmia 1996   1996  and 1997,   SVT and Atrial Fib   Sinusitis, chronic 01/26/2016   SMALL BOWEL OBSTRUCTION, HX OF 08/07/2008   Annotation: obstruction w/ adhesions led to partial colectomy Qualifier: Diagnosis of  By: Minna Merritts     Stroke [facility])    Phreesia 10/29/2020, TIAs in the past   Swelling of hand 09/17/2012   Syncope    Tachycardia 02/03/2010   Qualifier: Diagnosis of  By: Via LPN, Lynn     Thyroid disease    Phreesia 10/29/2020   Ulcer 12/15/2011   Urinary frequency 07/18/2014   Vaginitis and vulvovaginitis 05/10/2013   Vitamin D deficiency 05/05/2015   Wears glasses     Family History  Problem Relation Age of Onset   Congestive Heart Failure Mother    Diabetes Mother    Heart disease Mother    Hypertension Mother    Other Mother        ketoacidosis   Heart disease Father    Hyperlipidemia Father    Hypertension Father    Alcohol abuse Father    Congestive Heart Failure Father    Supraventricular tachycardia Daughter    Colon cancer Maternal Aunt    Breast cancer Maternal Aunt    Breast cancer Maternal Aunt    Congestive Heart Failure Maternal Aunt    Cancer Maternal Aunt    Heart attack Maternal  Aunt    Breast cancer Maternal Aunt    Cancer Maternal Aunt    Rheum arthritis Maternal Aunt    Breast cancer Maternal Aunt    Breast cancer Maternal Aunt    Cancer Maternal Uncle        mets   Prostate cancer Maternal Uncle    Bone cancer Maternal Grandfather        mets   Ovarian cancer Cousin 19   Breast cancer Cousin     SOCIAL HISTORY: Social History   Tobacco Use   Smoking status: Never   Smokeless tobacco: Never  Substance Use Topics   Alcohol use: No    Allergies  Allergen Reactions   Nitrofurantoin Hives   Crestor [Rosuvastatin Calcium] Other (See Comments)    Generalized cramps   Bisacodyl Other (See Comments)    Makes patient feel like she is having cramps    Codeine Itching   Iron Sucrose Other (See Comments)    Flushing- required benadryl and solu medrol   Monosodium Glutamate Other (See Comments)    Cluster migraines    Scopolamine Hbr Other (See Comments)    Cluster migraines, impaired vision   Morphine Rash   Other Hives, Itching, Swelling, Rash and Other (See Comments)    Cigarette smoke    Current Outpatient Medications  Medication Sig Dispense Refill   ALPRAZolam (XANAX) 1 MG tablet Take 1 tablet (1 mg total) by mouth 2 (two) times daily. 60 tablet 5   aspirin 81 MG chewable tablet Chew 81 mg by mouth daily.     butalbital-acetaminophen-caffeine (FIORICET) 50-325-40 MG tablet Take 1 tablet by mouth every 6 (six) hours as needed.     Cholecalciferol (VITAMIN D3) 1.25 MG (50000 UT) CAPS Take 1 capsule by mouth every 30 (  thirty) days.     conjugated estrogens (PREMARIN) vaginal cream INSERT 1 APPLICATORFUL VAGINALLY DAILY (Patient taking differently: Place 1 applicator vaginally at bedtime.) 30 g 2   cyanocobalamin (VITAMIN B12) 1000 MCG/ML injection Inject 1 mL (1,000 mcg total) into the muscle every 30 (thirty) days. 1 mL 11   cyclobenzaprine (FLEXERIL) 10 MG tablet Take 1 tablet (10 mg total) by mouth 2 (two) times daily as needed for muscle  spasms. 180 tablet 0   cycloSPORINE (RESTASIS) 0.05 % ophthalmic emulsion Place 1 drop into both eyes 2 (two) times daily as needed (dry eyes).     DULoxetine (CYMBALTA) 60 MG capsule Take 60 mg by mouth daily.     erythromycin (ERY-TAB) 250 MG EC tablet TAKE 1 TABLET BY MOUTH THREE TIMES DAILY BEFORE MEALS (Patient taking differently: 750 mg at bedtime.) 270 tablet 2   ezetimibe (ZETIA) 10 MG tablet TAKE 1 TABLET(10 MG) BY MOUTH DAILY 90 tablet 3   fenofibrate (TRICOR) 48 MG tablet Take 1 tablet (48 mg total) by mouth daily. 30 tablet 5   fluticasone (FLONASE) 50 MCG/ACT nasal spray Place 2 sprays into both nostrils daily as needed for allergies.     furosemide (LASIX) 20 MG tablet Take 1 tablet (20 mg total) by mouth daily. 30 tablet 3   HYDROmorphone (DILAUDID) 4 MG tablet Take 4 mg by mouth 3 (three) times daily.     levothyroxine (SYNTHROID) 125 MCG tablet TAKE 1 TABLET(125 MCG) BY MOUTH DAILY BEFORE BREAKFAST 90 tablet 1   lubiprostone (AMITIZA) 24 MCG capsule TAKE 1 CAPSULE(24 MCG) BY MOUTH TWICE DAILY WITH A MEAL 60 capsule 5   magnesium oxide (MAG-OX) 400 (240 Mg) MG tablet TAKE 1 TABLET BY MOUTH FOUR TIMES DAILY (Patient taking differently: Take 400 mg by mouth 2 (two) times daily.) 180 tablet 0   metoprolol succinate (TOPROL-XL) 25 MG 24 hr tablet TAKE 1 TABLET(25 MG) BY MOUTH DAILY 90 tablet 2   Multiple Vitamins-Minerals (ICAPS AREDS 2 PO) Take 1 capsule by mouth every evening.     naloxone (NARCAN) nasal spray 4 mg/0.1 mL      omeprazole (PRILOSEC) 40 MG capsule Take 1 capsule (40 mg total) by mouth in the morning and at bedtime. 180 capsule 3   OVER THE COUNTER MEDICATION daily at 6 (six) AM. Nutra-ful     phenazopyridine (PYRIDIUM) 95 MG tablet Take by mouth 3 (three) times daily as needed. sd     polyethylene glycol (MIRALAX / GLYCOLAX) 17 g packet Take by mouth.     potassium chloride (KLOR-CON M) 10 MEQ tablet Take 1 tablet (10 mEq total) by mouth 2 (two) times daily. 180 tablet  3   pravastatin (PRAVACHOL) 20 MG tablet TAKE 1 TABLET(20 MG) BY MOUTH DAILY 90 tablet 1   sennosides-docusate sodium (SENOKOT-S) 8.6-50 MG tablet 2 tablets 2 (two) times daily.     XARELTO 20 MG TABS tablet TAKE 1 TABLET(20 MG) BY MOUTH DAILY 30 tablet 1   No current facility-administered medications for this visit.    REVIEW OF SYSTEMS:  [X]  denotes positive finding, [ ]  denotes negative finding Cardiac  Comments:  Chest pain or chest pressure:    Shortness of breath upon exertion:    Short of breath when lying flat:    Irregular heart rhythm:        Vascular    Pain in calf, thigh, or hip brought on by ambulation:    Pain in feet at night that wakes you up  from your sleep:     Blood clot in your veins:    Arm swelling:  x       Pulmonary    Oxygen at home:    Productive cough:     Wheezing:         Neurologic    Sudden weakness in arms or legs:     Sudden numbness in arms or legs:     Sudden onset of difficulty speaking or slurred speech:    Temporary loss of vision in one eye:     Problems with dizziness:         Gastrointestinal    Blood in stool:     Vomited blood:         Genitourinary    Burning when urinating:     Blood in urine:        Psychiatric    Major depression:         Hematologic    Bleeding problems:    Problems with blood clotting too easily:        Skin    Rashes or ulcers:        Constitutional    Fever or chills:    -  PHYSICAL EXAM:   Vitals:   08/26/23 1536  BP: 103/72  Pulse: 87  Resp: 20  Temp: 98 F (36.7 C)  Weight: 191 lb (86.6 kg)  Height: 5\' 4"  (1.626 m)   Body mass index is 32.79 kg/m. GENERAL: The patient is a well-nourished female, in no acute distress. The vital signs are documented above. CARDIAC: There is a regular rate and rhythm.  VASCULAR: I do not detect carotid bruits. She has palpable radial pulses. I can still feel her radial pulses with abduction and external rotation of the arm. The right arm is  slightly swollen with some slight venous congestion. PULMONARY: There is good air exchange bilaterally without wheezing or rales. ABDOMEN: Soft and non-tender with normal pitched bowel sounds.  MUSCULOSKELETAL: There are no major deformities. NEUROLOGIC: No focal weakness or paresthesias are detected. SKIN: There are no ulcers or rashes noted. PSYCHIATRIC: The patient has a normal affect.  DATA:    RIGHT UPPER EXTREMITY VENOUS DUPLEX: I have independently interpreted her right upper extremity venous duplex scan.  There is no evidence of DVT or superficial venous thrombosis.  She has triphasic radial and ulnar signals with the Doppler.  Waverly Ferrari [facility]

## 2023-08-31 ENCOUNTER — Ambulatory Visit: Payer: Medicare Other | Admitting: Family Medicine

## 2023-09-02 ENCOUNTER — Encounter: Payer: Self-pay | Admitting: *Deleted

## 2023-09-02 ENCOUNTER — Other Ambulatory Visit: Payer: Self-pay | Admitting: *Deleted

## 2023-09-02 ENCOUNTER — Other Ambulatory Visit: Payer: Self-pay

## 2023-09-02 DIAGNOSIS — M7989 Other specified soft tissue disorders: Secondary | ICD-10-CM

## 2023-09-02 DIAGNOSIS — D509 Iron deficiency anemia, unspecified: Secondary | ICD-10-CM

## 2023-09-03 ENCOUNTER — Inpatient Hospital Stay: Payer: Medicare Other | Attending: Hematology

## 2023-09-03 DIAGNOSIS — Z79899 Other long term (current) drug therapy: Secondary | ICD-10-CM | POA: Insufficient documentation

## 2023-09-03 DIAGNOSIS — Z9049 Acquired absence of other specified parts of digestive tract: Secondary | ICD-10-CM | POA: Insufficient documentation

## 2023-09-03 DIAGNOSIS — M549 Dorsalgia, unspecified: Secondary | ICD-10-CM | POA: Diagnosis not present

## 2023-09-03 DIAGNOSIS — Z8042 Family history of malignant neoplasm of prostate: Secondary | ICD-10-CM | POA: Insufficient documentation

## 2023-09-03 DIAGNOSIS — D509 Iron deficiency anemia, unspecified: Secondary | ICD-10-CM | POA: Diagnosis not present

## 2023-09-03 DIAGNOSIS — E559 Vitamin D deficiency, unspecified: Secondary | ICD-10-CM | POA: Insufficient documentation

## 2023-09-03 DIAGNOSIS — Z8261 Family history of arthritis: Secondary | ICD-10-CM | POA: Insufficient documentation

## 2023-09-03 DIAGNOSIS — M2550 Pain in unspecified joint: Secondary | ICD-10-CM | POA: Diagnosis not present

## 2023-09-03 DIAGNOSIS — R300 Dysuria: Secondary | ICD-10-CM | POA: Diagnosis not present

## 2023-09-03 DIAGNOSIS — Z808 Family history of malignant neoplasm of other organs or systems: Secondary | ICD-10-CM | POA: Insufficient documentation

## 2023-09-03 DIAGNOSIS — R519 Headache, unspecified: Secondary | ICD-10-CM | POA: Insufficient documentation

## 2023-09-03 DIAGNOSIS — Z8673 Personal history of transient ischemic attack (TIA), and cerebral infarction without residual deficits: Secondary | ICD-10-CM | POA: Diagnosis not present

## 2023-09-03 DIAGNOSIS — Z881 Allergy status to other antibiotic agents status: Secondary | ICD-10-CM | POA: Insufficient documentation

## 2023-09-03 DIAGNOSIS — M791 Myalgia, unspecified site: Secondary | ICD-10-CM | POA: Diagnosis not present

## 2023-09-03 DIAGNOSIS — G2581 Restless legs syndrome: Secondary | ICD-10-CM | POA: Insufficient documentation

## 2023-09-03 DIAGNOSIS — Z7901 Long term (current) use of anticoagulants: Secondary | ICD-10-CM | POA: Insufficient documentation

## 2023-09-03 DIAGNOSIS — E538 Deficiency of other specified B group vitamins: Secondary | ICD-10-CM | POA: Insufficient documentation

## 2023-09-03 DIAGNOSIS — Z853 Personal history of malignant neoplasm of breast: Secondary | ICD-10-CM | POA: Diagnosis not present

## 2023-09-03 DIAGNOSIS — R5383 Other fatigue: Secondary | ICD-10-CM | POA: Insufficient documentation

## 2023-09-03 DIAGNOSIS — Z811 Family history of alcohol abuse and dependence: Secondary | ICD-10-CM | POA: Insufficient documentation

## 2023-09-03 DIAGNOSIS — Z8614 Personal history of Methicillin resistant Staphylococcus aureus infection: Secondary | ICD-10-CM | POA: Insufficient documentation

## 2023-09-03 DIAGNOSIS — Z888 Allergy status to other drugs, medicaments and biological substances status: Secondary | ICD-10-CM | POA: Insufficient documentation

## 2023-09-03 DIAGNOSIS — Z833 Family history of diabetes mellitus: Secondary | ICD-10-CM | POA: Insufficient documentation

## 2023-09-03 DIAGNOSIS — Z79890 Hormone replacement therapy (postmenopausal): Secondary | ICD-10-CM | POA: Insufficient documentation

## 2023-09-03 DIAGNOSIS — Z8719 Personal history of other diseases of the digestive system: Secondary | ICD-10-CM | POA: Insufficient documentation

## 2023-09-03 DIAGNOSIS — Z803 Family history of malignant neoplasm of breast: Secondary | ICD-10-CM | POA: Insufficient documentation

## 2023-09-03 DIAGNOSIS — K909 Intestinal malabsorption, unspecified: Secondary | ICD-10-CM | POA: Insufficient documentation

## 2023-09-03 DIAGNOSIS — Z885 Allergy status to narcotic agent status: Secondary | ICD-10-CM | POA: Insufficient documentation

## 2023-09-03 DIAGNOSIS — Z809 Family history of malignant neoplasm, unspecified: Secondary | ICD-10-CM | POA: Insufficient documentation

## 2023-09-03 DIAGNOSIS — G479 Sleep disorder, unspecified: Secondary | ICD-10-CM | POA: Diagnosis not present

## 2023-09-03 DIAGNOSIS — Z800 Family history of malignant neoplasm of digestive organs: Secondary | ICD-10-CM | POA: Insufficient documentation

## 2023-09-03 DIAGNOSIS — Z90710 Acquired absence of both cervix and uterus: Secondary | ICD-10-CM | POA: Insufficient documentation

## 2023-09-03 DIAGNOSIS — R079 Chest pain, unspecified: Secondary | ICD-10-CM | POA: Insufficient documentation

## 2023-09-03 DIAGNOSIS — Z86711 Personal history of pulmonary embolism: Secondary | ICD-10-CM | POA: Insufficient documentation

## 2023-09-03 DIAGNOSIS — Z90722 Acquired absence of ovaries, bilateral: Secondary | ICD-10-CM | POA: Insufficient documentation

## 2023-09-03 DIAGNOSIS — Z83438 Family history of other disorder of lipoprotein metabolism and other lipidemia: Secondary | ICD-10-CM | POA: Insufficient documentation

## 2023-09-03 DIAGNOSIS — Z9012 Acquired absence of left breast and nipple: Secondary | ICD-10-CM | POA: Insufficient documentation

## 2023-09-03 LAB — CBC WITH DIFFERENTIAL/PLATELET
Abs Immature Granulocytes: 0.02 10*3/uL (ref 0.00–0.07)
Basophils Absolute: 0 10*3/uL (ref 0.0–0.1)
Basophils Relative: 1 %
Eosinophils Absolute: 0.1 10*3/uL (ref 0.0–0.5)
Eosinophils Relative: 2 %
HCT: 40.2 % (ref 36.0–46.0)
Hemoglobin: 13.6 g/dL (ref 12.0–15.0)
Immature Granulocytes: 0 %
Lymphocytes Relative: 38 %
Lymphs Abs: 2.3 10*3/uL (ref 0.7–4.0)
MCH: 30.6 pg (ref 26.0–34.0)
MCHC: 33.8 g/dL (ref 30.0–36.0)
MCV: 90.5 fL (ref 80.0–100.0)
Monocytes Absolute: 0.5 10*3/uL (ref 0.1–1.0)
Monocytes Relative: 8 %
Neutro Abs: 3.1 10*3/uL (ref 1.7–7.7)
Neutrophils Relative %: 51 %
Platelets: 248 10*3/uL (ref 150–400)
RBC: 4.44 MIL/uL (ref 3.87–5.11)
RDW: 13.1 % (ref 11.5–15.5)
WBC: 6.1 10*3/uL (ref 4.0–10.5)
nRBC: 0 % (ref 0.0–0.2)

## 2023-09-03 LAB — FERRITIN: Ferritin: 113 ng/mL (ref 11–307)

## 2023-09-03 LAB — IRON AND TIBC
Iron: 96 ug/dL (ref 28–170)
Saturation Ratios: 26 % (ref 10.4–31.8)
TIBC: 369 ug/dL (ref 250–450)
UIBC: 273 ug/dL

## 2023-09-05 DIAGNOSIS — Z79899 Other long term (current) drug therapy: Secondary | ICD-10-CM | POA: Diagnosis not present

## 2023-09-05 DIAGNOSIS — G894 Chronic pain syndrome: Secondary | ICD-10-CM | POA: Diagnosis not present

## 2023-09-05 DIAGNOSIS — Z6834 Body mass index (BMI) 34.0-34.9, adult: Secondary | ICD-10-CM | POA: Diagnosis not present

## 2023-09-05 DIAGNOSIS — E6609 Other obesity due to excess calories: Secondary | ICD-10-CM | POA: Diagnosis not present

## 2023-09-05 DIAGNOSIS — R1084 Generalized abdominal pain: Secondary | ICD-10-CM | POA: Diagnosis not present

## 2023-09-06 NOTE — Progress Notes (Unsigned)
[facility] [address], [place] [address]   CLINIC:  Medical Oncology/Hematology  PCP:  Kerri Perches, MD [address], [address] [place] [address] [phone]   REASON FOR VISIT:  Follow-up for iron deficiency anemia (secondary to malabsorption from multiple bowel resections + history of Paget's disease of left breast (mastectomy in 2006) + unprovoked PE in 1997 (chronic Xarelto)   CURRENT THERAPY: IV iron (last INFED on 06/09/2023)  INTERVAL HISTORY:   [name] 62 y.o. female returns for routine follow-up of  iron deficiency anemia.  She was last evaluated via telemedicine visit by Rojelio Brenner PA-C on 02/16/2023.   [name] has multiple medical complaints at today's visit related to her high burden of chronic disease and multiple medical issues, particularly her gastroparesis and ongoing complications from her previous abdominal surgeries.*** Since her last visit, she was hospitalized from 04/22/2023 through 04/24/2023 for small bowel obstruction.  *** She continues to have chronic fatigue, which has been somewhat worsened lately.  *** Energy after INFeD 500 mg on 06/09/2023 *** She currently reports that she is "completely wiped out." *** She has diffuse myalgias and feels "achy all over."   ***She reports stable restless legs, headaches, dyspnea on exertion.  *** She continues to have episodes of lightheadedness, "room spinning," and her arms and legs feeling numb.   ***She has previously had issues with chest pain for the past several years, which she reports has had negative cardiac work-up - she reports that she reached out to her cardiologist again due to worsening episodes of chest pain.***   She continues to take Xarelto due to her history of unprovoked pulmonary embolisms.  ***She is tolerating it well without major bleeding events.  ***She denies any epistaxis, hematemesis, hematochezia, or melena.   ***She has not had any new  unilateral leg swelling, pain, or erythema.   She denies any new breast lumps or lymphadenopathy.***  Screening mammogram from 07/06/2023 was BI-RADS Category 1, negative.   She has had significant weight loss over the past several months, which she believes is from her gastroparesis and other GI issues. *** She reports 25***% energy and 75***% appetite.  ASSESSMENT & PLAN:  1.   Iron deficiency anemia: - History of iron deficiency status secondary to multiple bowel resections and malabsorption. - Previous work-up (02/13/2021) showed normal LDH.  Folic acid and B12, copper and methylmalonic acid were normal.  SPEP was negative.  CA 15-3 and TSH were also normal. - Most recent EGD (03/31/2022) showed Barrett's esophagus with normal stomach.  Single duodenal polyp resected. - Most recent colonoscopy (05/02/2021) was limited by liquid stool in the colon, showed nonbleeding internal hemorrhoids - Patient was given IV Venofer on 02/18/2021 but had a reaction to Venofer.  Most recently received received INFeD infusion on 06/09/2023, which she tolerated after premedication was given. - No bright red blood per rectum or melena*** - Most recent labs (09/03/2023): Hgb 13.6/MCV 90.5, ferritin 113, iron saturation 26% - PLAN: No indication for IV iron at this time. - We will check CBC and iron panel in 3 months (labs only).***  Labs and office visit in 6 months.***   2.  Unprovoked pulmonary embolism: - Most recent D-dimer is negative.  No current signs or symptoms of DVT/PE. - Patient is worried that she is not digesting her medications, since she is having issues with GI motility, gastroparesis, and frequent vomiting. - We discussed that she could change from Xarelto  to Lovenox, but she prefers to hold off on this for the time being.  She will discuss with her other specialists and reach out to our clinic if she decides to change the formulation of her anticoagulant. - PLAN: Continue Xarelto.  Would consider  switching to Lovenox in the future if there is persistent concern for GI absorption of medications in the setting of GI motility issues and gastroparesis.    3.  History of Paget's disease of left breast - History of Paget's disease of the left breast which was resected with mastectomy in 2006. - Most recent mammogram 06/02/2021: BI-RADS Category 1 negative - Most recent mammogram (07/06/2023): BI-RADS Category 1, negative - She reports itching in her right nipple. *** Patient reports that she was also told that her right breast implant has been recalled and she hopes to get this removed in the future.*** - She had negative BRCA testing in 2006.  She request referral to genetic counselor to see if she qualifies for repeat testing. - PLAN: Continue annual mammograms and breast exams via PCP and breast center.***  If any findings concerning for malignancy are discovered, she will need to be referred back to [place] as a new patient for evaluation by Dr. Ellin Saba. - Referral placed to genetic counselor per patient request.   4.  B12 deficiency secondary to intestinal malabsorption - She has been self-administering B12 injections - Most recent B12 and MMA are normal (08/04/2023) - PLAN: Continue B12 injections. *** We will check levels at follow-up in 6 months.   5.  Vitamin D deficiency - Prior vitamin D level (04/29/2022) showed vitamin D overload at 131.72 in the setting of vitamin D 50,000 units weekly - She is currently taking vitamin D 50,000 units monthly - Most recent vitamin D (08/04/2023) is normal at 77.10 - PLAN: Continue vitamin D 50,000 units monthly.*** - We will recheck at follow-up visit in 6 months  PLAN SUMMARY:*** Need to review *** >> Labs only in 3 months = CBC/D, ferritin, iron/TIBC (will watch for results and have nurse call if any change to treatment plan) >> Labs in 6 months = CBC/D, CMP, ferritin, iron/TIBC, B12, MMA, vitamin D >> OFFICE visit in 6 months (1 week after labs)      REVIEW OF SYSTEMS: ***  Review of Systems - Oncology   PHYSICAL EXAM:  ECOG PERFORMANCE STATUS: {CHL ONC ECOG UY:[id number]} *** There were no vitals filed for this visit. There were no vitals filed for this visit. Physical Exam  PAST MEDICAL/SURGICAL HISTORY:  Past Medical History:  Diagnosis Date   Abdominal hernia 08/14/2019   Abdominal pain 06/06/2019   Acute bronchitis 05/01/2013   Acute cystitis 06/09/2010   Qualifier: Diagnosis of  By: Lillia Mountain LPN, Brandi     Acute renal insufficiency 08/28/2011   Allergy    Phreesia 03/16/2021   Anemia    Phreesia 10/29/2020   Anxiety    Anxiety state 03/16/2008   Qualifier: Diagnosis of  By: Lind Guest     Arthritis    Phreesia 10/29/2020   Back pain with radiation 07/17/2014   Bilateral hand pain 07/27/2016   Blood transfusion without reported diagnosis    Phreesia 10/29/2020   Breast cancer (HCC)    L breast- 2006   Cancer (HCC)    Phreesia 10/29/2020   Carpal tunnel syndrome    Chronic constipation    Chronic pain syndrome    followed by [facility]---  back   Clotting disorder (HCC)  Phreesia 10/29/2020   Depression    Dermatitis 12/15/2011   Difficult intubation    Educated about COVID-19 virus infection 05/14/2019   Fall at home 01/26/2016   Family history of adverse reaction to anesthesia    MOTHER--- PONV   Fatigue 09/17/2012   Fibromyalgia    FIBROMYALGIA 03/16/2008   Qualifier: Diagnosis of  By: Lind Guest     GERD (gastroesophageal reflux disease)    Headache disorder 01/16/2013   Headache syndrome 01/26/2016   Hip pain, right 07/08/2019   History of MRSA infection    lip abscess   History of ovarian cyst 06/2011   s/p  BSO   History of pulmonary embolus (PE) 1997   post EP with ablation pulmonary veouns for SVT/ Atrial Fib.   History of supraventricular tachycardia    s/p  ablation 1996  and 1997  by dr Graciela Husbands   History of TIA (transient ischemic attack) 1997   post op EP ablation PE   Hyperlipidemia     Hyperlipidemia LDL goal <100 03/16/2008   Qualifier: Diagnosis of  By: Lind Guest     Hypothyroidism    followed by pcp   Incisional hernia    Insomnia 12/15/2011   Intermittent palpitations 08/22/2017   Interstitial cystitis    09-13-2018   per pt last flare-up  May 2019 (followed by pcp)   INTERSTITIAL CYSTITIS 02/17/2011   Qualifier: Diagnosis of  By: Lodema Hong MD, Margaret     Iron deficiency anemia    09-13-2018  PER PT STABLE   Irregular heart rate 07/25/2013   Lipoma of back    upper   Low back pain radiating to right leg 07/04/2019   Low ferritin level 06/14/2014   MDD (major depressive disorder), single episode, in full remission (HCC) 10/27/2017   Medically noncompliant 02/28/2015   Multiple missed appointments, both follow-up appointments and lab appointments.    Metabolic syndrome X 02/10/2011   Qualifier: Diagnosis of  By: Lodema Hong MD, Margaret  hBA1c is 5.8 in 02/2013    Migraines    Morbid obesity (HCC) 03/27/2013   Muscle spasm 01/03/2020   Nausea alone 07/17/2014   NECK PAIN, CHRONIC 03/16/2008   Qualifier: Diagnosis of  By: Lind Guest     Normal coronary arteries    a. by CT 12/2018.   Obesity 03/16/2008   Qualifier: Diagnosis of  By: Lind Guest     Oral ulceration 08/23/2014   Presented at 06/14/2014 visit    Other malaise and fatigue 03/16/2008   Centricity Description: FATIGUE, CHRONIC Qualifier: Diagnosis of  By: Lind Guest   Centricity Description: FATIGUE Qualifier: Diagnosis of  By: Garnette Czech PA, Dawn     OVARIAN CYST 12/26/2009   Qualifier: Diagnosis of  By: Lodema Hong MD, Margaret     Paget disease of breast, left [facility])    Paget's disease of breast, left (HCC) 03/16/2008   Qualifier: Diagnosis of  By: Lind Guest  Left diagnosed in 2006 F/h breast cancer x 15 family members   Partial small bowel obstruction (HCC) 07/04/2012   PONV (postoperative nausea and vomiting)    SEVERE   Postsurgical menopause 11/13/2011   Presence of IVC filter 06/06/2019    PSVT (paroxysmal supraventricular tachycardia)    HX ABLATION 1996 AND 1997   Recurrent oral herpes simplex infection 10/13/2017   ROM (right otitis media) 01/23/2013   S/P insertion of IVC (inferior vena caval) filter 05/08/2005   [place] (non-retrievable)  /  dx 2019  a leg of  filter is protruding thru the vena cava in to right L2 vertebral body (09-13-2018  per pt having surgery to remove filter in [place])   S/P radiofrequency ablation operation for arrhythmia 1996   1996  and 1997,   SVT and Atrial Fib   Sinusitis, chronic 01/26/2016   SMALL BOWEL OBSTRUCTION, HX OF 08/07/2008   Annotation: obstruction w/ adhesions led to partial colectomy Qualifier: Diagnosis of  By: Minna Merritts     Stroke [facility])    Phreesia 10/29/2020, TIAs in the past   Swelling of hand 09/17/2012   Syncope    Tachycardia 02/03/2010   Qualifier: Diagnosis of  By: Via LPN, Lynn     Thyroid disease    Phreesia 10/29/2020   Ulcer 12/15/2011   Urinary frequency 07/18/2014   Vaginitis and vulvovaginitis 05/10/2013   Vitamin D deficiency 05/05/2015   Wears glasses    Past Surgical History:  Procedure Laterality Date   ABDOMINAL HYSTERECTOMY  1987   ANTERIOR CERVICAL DECOMP/DISCECTOMY FUSION  03-07-2002   dr elsner  @[facility]    C 4 -- 5   APPENDECTOMY  1980   AUGMENTATION MAMMAPLASTY Right 2006   BILATERAL SALPINGOOPHORECTOMY  07/24/2011   via Explor. Lap. w/ intraoperative perf. bowel repair   BIOPSY  05/02/2021   Procedure: BIOPSY;  Surgeon: Marguerita Merles, Reuel Boom, MD;  [facility];  Service: Gastroenterology;;  small bowel, mid esophagus, distal esophagus, random colon biopsies   BIOPSY  12/04/2021   Procedure: BIOPSY;  Surgeon: Lanelle Bal, DO;  [facility];  Service: Endoscopy;;   BIOPSY  03/31/2022   Procedure: BIOPSY;  Surgeon: Dolores Frame, MD;  [facility];  Service: Gastroenterology;;   BOTOX INJECTION N/A 03/31/2022   Procedure: BOTOX INJECTION;  Surgeon:  Dolores Frame, MD;  [facility];  Service: Gastroenterology;  Laterality: N/A;   BREAST BIOPSY Right 2019   benign   BREAST ENHANCEMENT SURGERY Bilateral 1993   BREAST IMPLANT REMOVAL Bilateral    BREAST SURGERY N/A    Phreesia 10/29/2020   CARDIAC CATHETERIZATION  07-06-2003   dr Smitty Cords brodie   normal coronaries and LVF   CARDIAC ELECTROPHYSIOLOGY STUDY AND ABLATION  1996  and 1997   CARDIOVASCULAR STRESS TEST  11-18-2015   dr Graciela Husbands   normal nuclear study w/ no ischemia/  normal LV function and wall motion , ef 84%   CARPAL TUNNEL RELEASE Right ?   CESAREAN SECTION  1986   CESAREAN SECTION N/A    Phreesia 10/29/2020   CHOLECYSTECTOMY N/A    Phreesia 10/29/2020   COLON SURGERY     COLONOSCOPY WITH PROPOFOL N/A 05/02/2021   Procedure: COLONOSCOPY WITH PROPOFOL;  Surgeon: Dolores Frame, MD;  [facility];  Service: Gastroenterology;  Laterality: N/A;  12:30 PM   CYSTO/  HYDRODISTENTION/  INSTILATION THERAPY  MULTIPLE   ENTEROCUTANEOUS FISTULA CLOSURE  multiple   last one 2015 with small bowel resection   ESOPHAGOGASTRODUODENOSCOPY (EGD) WITH PROPOFOL N/A 05/02/2021   Procedure: ESOPHAGOGASTRODUODENOSCOPY (EGD) WITH PROPOFOL;  Surgeon: Dolores Frame, MD;  [facility];  Service: Gastroenterology;  Laterality: N/A;   ESOPHAGOGASTRODUODENOSCOPY (EGD) WITH PROPOFOL N/A 12/04/2021   Procedure: ESOPHAGOGASTRODUODENOSCOPY (EGD) WITH PROPOFOL;  Surgeon: Lanelle Bal, DO;  [facility];  Service: Endoscopy;  Laterality: N/A;   ESOPHAGOGASTRODUODENOSCOPY (EGD) WITH PROPOFOL N/A 03/31/2022   Procedure: ESOPHAGOGASTRODUODENOSCOPY (EGD) WITH PROPOFOL;  Surgeon: Dolores Frame, MD;  [facility];  Service: Gastroenterology;  Laterality: N/A;  1015   EXPLORATORY LAPAROTOMY INCISIONAL VENTRAL HERNIA REPAIR / RESECTION SMALL BOWEL  11-09-2014   @[facility]    FRACTURE SURGERY N/A    Phreesia 10/29/2020   JOINT  REPLACEMENT N/A    Phreesia 10/29/2020   LIPOMA EXCISION Right 09/16/2018   Procedure: EXCISION LIPOMA UPPER BACK;  Surgeon: Berna Bue, MD;  Location: [facility] [place];  Service: General;  Laterality: Right;   MASTECTOMY Left 2006   w/ reconstruction on left (paget's disease)  and right breast augmentation   POLYPECTOMY  05/02/2021   Procedure: POLYPECTOMY;  Surgeon: Dolores Frame, MD;  [facility];  Service: Gastroenterology;;  gastric   POLYPECTOMY  03/31/2022   Procedure: POLYPECTOMY;  Surgeon: Dolores Frame, MD;  [facility];  Service: Gastroenterology;;   Gaspar Bidding DILATION  05/02/2021   Procedure: Gaspar Bidding DILATION;  Surgeon: Marguerita Merles, Reuel Boom, MD;  [facility];  Service: Gastroenterology;;   SMALL INTESTINE SURGERY N/A    Phreesia 10/29/2020   SPINE SURGERY N/A    Phreesia 10/29/2020   TOTAL COLECTOMY  08-04-2002    @[facility]    AND CHOLECYSTECTOMY  (colonic inertia)   TRANSTHORACIC ECHOCARDIOGRAM  02/04/2016   ef 60-65%,  grade 2 diastolic dysfunction/  mild MR   TUBAL LIGATION N/A    Phreesia 03/16/2021   VENA CAVA FILTER PLACEMENT  05/08/2005   @[facility]    [place] (non-retrievable)   WIDE EXCISION PERIRECTAL ABSCESSES  09-22-2005   @ [facility]    SOCIAL HISTORY:  Social History   Socioeconomic History   Marital status: Married    Spouse name: Not on file   Number of children: Not on file   Years of education: Not on file   Highest education level: 12th grade  Occupational History   Not on file  Tobacco Use   Smoking status: Never   Smokeless tobacco: Never  Vaping Use   Vaping status: Never Used  Substance and Sexual Activity   Alcohol use: No   Drug use: No   Sexual activity: Not Currently    Birth control/protection: Surgical    Comment: hyst  Other Topics Concern   Not on file  Social History Narrative   Not on file   Social Determinants of Health   Financial Resource Strain: Low Risk   (05/25/2023)   Overall Financial Resource Strain (CARDIA)    Difficulty of Paying Living Expenses: Not hard at all  Food Insecurity: No Food Insecurity (05/25/2023)   Hunger Vital Sign    Worried About Running Out of Food in the Last Year: Never true    Ran Out of Food in the Last Year: Never true  Transportation Needs: No Transportation Needs (05/25/2023)   PRAPARE - Administrator, Civil Service (Medical): No    Lack of Transportation (Non-Medical): No  Physical Activity: Sufficiently Active (05/25/2023)   Exercise Vital Sign    Days of Exercise per Week: 3 days    Minutes of Exercise per Session: 60 min  Recent Concern: Physical Activity - Insufficiently Active (04/29/2023)   Exercise Vital Sign    Days of Exercise per Week: 4 days    Minutes of Exercise per Session: 30 min  Stress: No Stress Concern Present (05/25/2023)   [employer] of Occupational Health - Occupational Stress Questionnaire    Feeling of Stress : Only a little  Recent Concern: Stress - Stress Concern Present (04/29/2023)   [employer] of Occupational Health -  Occupational Stress Questionnaire    Feeling of Stress : To some extent  Social Connections: Moderately Isolated (05/25/2023)   Social Connection and Isolation Panel [NHANES]    Frequency of Communication with Friends and Family: More than three times a week    Frequency of Social Gatherings with Friends and Family: Twice a week    Attends Religious Services: Never    Database administrator or Organizations: No    Attends Banker Meetings: Never    Marital Status: Married  Catering manager Violence: Not At Risk (05/25/2023)   Humiliation, Afraid, Rape, and Kick questionnaire    Fear of Current or Ex-Partner: No    Emotionally Abused: No    Physically Abused: No    Sexually Abused: No    FAMILY HISTORY:  Family History  Problem Relation Age of Onset   Congestive Heart Failure Mother    Diabetes Mother    Heart disease  Mother    Hypertension Mother    Other Mother        ketoacidosis   Heart disease Father    Hyperlipidemia Father    Hypertension Father    Alcohol abuse Father    Congestive Heart Failure Father    Supraventricular tachycardia Daughter    Colon cancer Maternal Aunt    Breast cancer Maternal Aunt    Breast cancer Maternal Aunt    Congestive Heart Failure Maternal Aunt    Cancer Maternal Aunt    Heart attack Maternal Aunt    Breast cancer Maternal Aunt    Cancer Maternal Aunt    Rheum arthritis Maternal Aunt    Breast cancer Maternal Aunt    Breast cancer Maternal Aunt    Cancer Maternal Uncle        mets   Prostate cancer Maternal Uncle    Bone cancer Maternal Grandfather        mets   Ovarian cancer Cousin 19   Breast cancer Cousin     CURRENT MEDICATIONS:  Outpatient Encounter Medications as of 09/07/2023  Medication Sig Note   ALPRAZolam (XANAX) 1 MG tablet Take 1 tablet (1 mg total) by mouth 2 (two) times daily.    aspirin 81 MG chewable tablet Chew 81 mg by mouth daily.    butalbital-acetaminophen-caffeine (FIORICET) 50-325-40 MG tablet Take 1 tablet by mouth every 6 (six) hours as needed for headache.    Cholecalciferol (VITAMIN D3) 1.25 MG (50000 UT) CAPS Take 1 capsule by mouth every 30 (thirty) days.    conjugated estrogens (PREMARIN) vaginal cream INSERT 1 APPLICATORFUL VAGINALLY DAILY (Patient taking differently: Place 1 applicator vaginally at bedtime.)    cyanocobalamin (VITAMIN B12) 1000 MCG/ML injection Inject 1 mL (1,000 mcg total) into the muscle every 30 (thirty) days.    cyclobenzaprine (FLEXERIL) 10 MG tablet Take 1 tablet (10 mg total) by mouth 2 (two) times daily as needed for muscle spasms. (Patient taking differently: Take 10 mg by mouth 2 (two) times daily.)    cycloSPORINE (RESTASIS) 0.05 % ophthalmic emulsion Place 1 drop into both eyes 2 (two) times daily as needed (dry eyes).    DULoxetine (CYMBALTA) 60 MG capsule Take 60 mg by mouth daily.     erythromycin (ERY-TAB) 250 MG EC tablet TAKE 1 TABLET BY MOUTH THREE TIMES DAILY BEFORE MEALS (Patient taking differently: Take 250 mg by mouth 3 (three) times daily.)    ezetimibe (ZETIA) 10 MG tablet TAKE 1 TABLET(10 MG) BY MOUTH DAILY    fenofibrate (TRICOR) 48  MG tablet Take 1 tablet (48 mg total) by mouth daily. 09/02/2023: Bedtime   furosemide (LASIX) 20 MG tablet Take 1 tablet (20 mg total) by mouth daily. (Patient taking differently: Take 20 mg by mouth 2 (two) times daily.)    HYDROmorphone (DILAUDID) 4 MG tablet Take 4 mg by mouth 3 (three) times daily.    levothyroxine (SYNTHROID) 125 MCG tablet TAKE 1 TABLET(125 MCG) BY MOUTH DAILY BEFORE BREAKFAST    lubiprostone (AMITIZA) 24 MCG capsule TAKE 1 CAPSULE(24 MCG) BY MOUTH TWICE DAILY WITH A MEAL    magnesium oxide (MAG-OX) 400 (240 Mg) MG tablet TAKE 1 TABLET BY MOUTH FOUR TIMES DAILY (Patient taking differently: Take 400 mg by mouth 2 (two) times daily.)    metoprolol succinate (TOPROL-XL) 25 MG 24 hr tablet TAKE 1 TABLET(25 MG) BY MOUTH DAILY (Patient taking differently: Take 25 mg by mouth 2 (two) times daily.)    Multiple Vitamins-Minerals (ICAPS AREDS 2 PO) Take 1 capsule by mouth at bedtime.    naloxone (NARCAN) nasal spray 4 mg/0.1 mL Place 1 spray into the nose as needed (opioid reversal).    omeprazole (PRILOSEC) 40 MG capsule Take 1 capsule (40 mg total) by mouth in the morning and at bedtime.    OVER THE COUNTER MEDICATION Take 2 tablets by mouth 2 (two) times daily. Nutra-ful    phenazopyridine (PYRIDIUM) 95 MG tablet Take by mouth 3 (three) times daily as needed. sd    polyethylene glycol (MIRALAX / GLYCOLAX) 17 g packet Take 17 g by mouth daily as needed for mild constipation or moderate constipation.    potassium chloride (KLOR-CON M) 10 MEQ tablet Take 1 tablet (10 mEq total) by mouth 2 (two) times daily.    pravastatin (PRAVACHOL) 20 MG tablet TAKE 1 TABLET(20 MG) BY MOUTH DAILY 09/02/2023: Bedtime   sennosides-docusate sodium  (SENOKOT-S) 8.6-50 MG tablet Take 2 tablets by mouth 3 (three) times daily.    XARELTO 20 MG TABS tablet TAKE 1 TABLET(20 MG) BY MOUTH DAILY    No facility-administered encounter medications on file as of 09/07/2023.    ALLERGIES:  Allergies  Allergen Reactions   Nitrofurantoin Hives   Crestor [Rosuvastatin Calcium] Other (See Comments)    Generalized cramps   Bisacodyl Other (See Comments)    Makes patient feel like she is having cramps    Codeine Itching   Iron Sucrose Other (See Comments)    Flushing- required benadryl and solu medrol   Monosodium Glutamate Other (See Comments)    Cluster migraines    Scopolamine Hbr Other (See Comments)    Cluster migraines, impaired vision   Morphine Rash   Other Hives, Itching, Swelling, Rash and Other (See Comments)    Cigarette smoke    LABORATORY DATA:  I have reviewed the labs as listed.  CBC    Component Value Date/Time   WBC 6.1 09/03/2023 1335   RBC 4.44 09/03/2023 1335   HGB 13.6 09/03/2023 1335   HGB 14.0 11/18/2018 0328   HCT 40.2 09/03/2023 1335   HCT 40.6 11/18/2018 0328   PLT 248 09/03/2023 1335   PLT 177 11/18/2018 0328   MCV 90.5 09/03/2023 1335   MCV 88 11/18/2018 0328   MCH 30.6 09/03/2023 1335   MCHC 33.8 09/03/2023 1335   RDW 13.1 09/03/2023 1335   RDW 12.4 11/18/2018 0328   LYMPHSABS 2.3 09/03/2023 1335   MONOABS 0.5 09/03/2023 1335   EOSABS 0.1 09/03/2023 1335   BASOSABS 0.0 09/03/2023 1335  Latest Ref Rng & Units 08/04/2023    8:53 AM 07/09/2023    1:16 PM 04/24/2023    7:03 AM  CMP  Glucose 70 - 99 mg/dL 147  829  89   BUN 8 - 23 mg/dL 15  12  17    Creatinine 0.44 - 1.00 mg/dL 5.62  1.30  8.65   Sodium 135 - 145 mmol/L 137  142  137   Potassium 3.5 - 5.1 mmol/L 3.9  4.1  3.6   Chloride 98 - 111 mmol/L 100  103  105   CO2 22 - 32 mmol/L 27  27  23    Calcium 8.9 - 10.3 mg/dL 9.4  8.9  8.4   Total Protein 6.5 - 8.1 g/dL 6.7  6.0    Total Bilirubin 0.3 - 1.2 mg/dL 0.7  <7.8    Alkaline Phos  38 - 126 U/L 84  121    AST 15 - 41 U/L 23  22    ALT 0 - 44 U/L 26  21      DIAGNOSTIC IMAGING:  I have independently reviewed the relevant imaging and discussed with the patient.   WRAP UP:  All questions were answered. The patient knows to call the clinic with any problems, questions or concerns.  Medical decision making: ***  Time spent on visit: I spent *** minutes counseling the patient face to face. The total time spent in the appointment was *** minutes and more than 50% was on counseling.  Carnella Guadalajara, PA-C  ***

## 2023-09-07 ENCOUNTER — Inpatient Hospital Stay: Payer: Medicare Other | Admitting: Physician Assistant

## 2023-09-07 VITALS — BP 114/88 | HR 82 | Temp 98.8°F | Resp 18 | Ht 64.0 in | Wt 193.6 lb

## 2023-09-07 DIAGNOSIS — R300 Dysuria: Secondary | ICD-10-CM | POA: Diagnosis not present

## 2023-09-07 DIAGNOSIS — Z853 Personal history of malignant neoplasm of breast: Secondary | ICD-10-CM | POA: Diagnosis not present

## 2023-09-07 DIAGNOSIS — R519 Headache, unspecified: Secondary | ICD-10-CM | POA: Diagnosis not present

## 2023-09-07 DIAGNOSIS — E559 Vitamin D deficiency, unspecified: Secondary | ICD-10-CM | POA: Diagnosis not present

## 2023-09-07 DIAGNOSIS — R079 Chest pain, unspecified: Secondary | ICD-10-CM | POA: Diagnosis not present

## 2023-09-07 DIAGNOSIS — M549 Dorsalgia, unspecified: Secondary | ICD-10-CM | POA: Diagnosis not present

## 2023-09-07 DIAGNOSIS — G2581 Restless legs syndrome: Secondary | ICD-10-CM | POA: Diagnosis not present

## 2023-09-07 DIAGNOSIS — K90829 Short bowel syndrome, unspecified: Secondary | ICD-10-CM | POA: Diagnosis not present

## 2023-09-07 DIAGNOSIS — D509 Iron deficiency anemia, unspecified: Secondary | ICD-10-CM | POA: Diagnosis not present

## 2023-09-07 DIAGNOSIS — E538 Deficiency of other specified B group vitamins: Secondary | ICD-10-CM

## 2023-09-07 DIAGNOSIS — M791 Myalgia, unspecified site: Secondary | ICD-10-CM | POA: Diagnosis not present

## 2023-09-07 DIAGNOSIS — K909 Intestinal malabsorption, unspecified: Secondary | ICD-10-CM | POA: Diagnosis not present

## 2023-09-07 DIAGNOSIS — Z79890 Hormone replacement therapy (postmenopausal): Secondary | ICD-10-CM | POA: Diagnosis not present

## 2023-09-07 DIAGNOSIS — R5383 Other fatigue: Secondary | ICD-10-CM | POA: Diagnosis not present

## 2023-09-07 DIAGNOSIS — M2550 Pain in unspecified joint: Secondary | ICD-10-CM | POA: Diagnosis not present

## 2023-09-07 DIAGNOSIS — G479 Sleep disorder, unspecified: Secondary | ICD-10-CM | POA: Diagnosis not present

## 2023-09-07 DIAGNOSIS — Z7901 Long term (current) use of anticoagulants: Secondary | ICD-10-CM | POA: Diagnosis not present

## 2023-09-10 DIAGNOSIS — N390 Urinary tract infection, site not specified: Secondary | ICD-10-CM | POA: Diagnosis not present

## 2023-09-13 ENCOUNTER — Encounter (HOSPITAL_COMMUNITY)
Admission: RE | Disposition: A | Discharge status: Home / Self Care | Payer: Self-pay | Source: Home / Self Care | Attending: Vascular Surgery

## 2023-09-13 ENCOUNTER — Telehealth: Payer: Self-pay | Admitting: Vascular Surgery

## 2023-09-13 ENCOUNTER — Other Ambulatory Visit: Payer: Self-pay | Admitting: Family Medicine

## 2023-09-13 ENCOUNTER — Ambulatory Visit (HOSPITAL_COMMUNITY)
Admission: RE | Admit: 2023-09-13 | Discharge: 2023-09-13 | Disposition: A | Discharge status: Home / Self Care | Payer: Medicare Other | Attending: Vascular Surgery | Admitting: Vascular Surgery

## 2023-09-13 ENCOUNTER — Other Ambulatory Visit: Payer: Self-pay

## 2023-09-13 DIAGNOSIS — M7989 Other specified soft tissue disorders: Secondary | ICD-10-CM | POA: Diagnosis not present

## 2023-09-13 DIAGNOSIS — Z86711 Personal history of pulmonary embolism: Secondary | ICD-10-CM

## 2023-09-13 DIAGNOSIS — Z7901 Long term (current) use of anticoagulants: Secondary | ICD-10-CM | POA: Diagnosis not present

## 2023-09-13 DIAGNOSIS — R202 Paresthesia of skin: Secondary | ICD-10-CM | POA: Insufficient documentation

## 2023-09-13 DIAGNOSIS — Z86718 Personal history of other venous thrombosis and embolism: Secondary | ICD-10-CM | POA: Diagnosis not present

## 2023-09-13 DIAGNOSIS — R2231 Localized swelling, mass and lump, right upper limb: Secondary | ICD-10-CM

## 2023-09-13 DIAGNOSIS — M79601 Pain in right arm: Secondary | ICD-10-CM | POA: Diagnosis not present

## 2023-09-13 HISTORY — PX: PERIPHERAL VASCULAR BALLOON ANGIOPLASTY: CATH118281

## 2023-09-13 HISTORY — PX: UPPER EXTREMITY VENOGRAPHY: CATH118272

## 2023-09-13 LAB — POCT I-STAT, CHEM 8
BUN: 12 mg/dL (ref 8–23)
Calcium, Ion: 1.15 mmol/L (ref 1.15–1.40)
Chloride: 104 mmol/L (ref 98–111)
Creatinine, Ser: 1 mg/dL (ref 0.44–1.00)
Glucose, Bld: 97 mg/dL (ref 70–99)
HCT: 37 % (ref 36.0–46.0)
Hemoglobin: 12.6 g/dL (ref 12.0–15.0)
Potassium: 4 mmol/L (ref 3.5–5.1)
Sodium: 140 mmol/L (ref 135–145)
TCO2: 24 mmol/L (ref 22–32)

## 2023-09-13 SURGERY — UPPER EXTREMITY VENOGRAPHY
Anesthesia: LOCAL | Laterality: Right

## 2023-09-13 MED ORDER — SODIUM CHLORIDE 0.9% FLUSH: 3.0000 mL | INTRAVENOUS | Status: DC | PRN

## 2023-09-13 MED ORDER — ONDANSETRON HCL 4 MG/2ML IJ SOLN: 4.0000 mg | Freq: Four times a day (QID) | INTRAMUSCULAR | Status: DC | PRN

## 2023-09-13 MED ORDER — SODIUM CHLORIDE 0.9 % IV SOLN: INTRAVENOUS | Status: DC

## 2023-09-13 MED ORDER — LIDOCAINE HCL (PF) 1 % IJ SOLN
INTRAMUSCULAR | Status: DC | PRN
  Administered 2023-09-13: 5 mL

## 2023-09-13 MED ORDER — HEPARIN (PORCINE) IN NACL 1000-0.9 UT/500ML-% IV SOLN
INTRAVENOUS | Status: DC | PRN
  Administered 2023-09-13: 500 mL

## 2023-09-13 MED ORDER — MIDAZOLAM HCL 2 MG/2ML IJ SOLN
INTRAMUSCULAR | Status: AC
  Filled 2023-09-13: qty 2

## 2023-09-13 MED ORDER — LABETALOL HCL 5 MG/ML IV SOLN: 10.0000 mg | INTRAVENOUS | Status: DC | PRN

## 2023-09-13 MED ORDER — MIDAZOLAM HCL 2 MG/2ML IJ SOLN
INTRAMUSCULAR | Status: DC | PRN
  Administered 2023-09-13 (×2): 1 mg via INTRAVENOUS

## 2023-09-13 MED ORDER — HYDRALAZINE HCL 20 MG/ML IJ SOLN: 5.0000 mg | INTRAMUSCULAR | Status: DC | PRN

## 2023-09-13 MED ORDER — FENTANYL CITRATE (PF) 100 MCG/2ML IJ SOLN
INTRAMUSCULAR | Status: DC | PRN
  Administered 2023-09-13 (×2): 25 ug via INTRAVENOUS

## 2023-09-13 MED ORDER — OXYCODONE HCL 5 MG PO TABS: 5.0000 mg | ORAL_TABLET | ORAL | Status: DC | PRN

## 2023-09-13 MED ORDER — ACETAMINOPHEN 325 MG PO TABS: 650.0000 mg | ORAL_TABLET | ORAL | Status: DC | PRN

## 2023-09-13 MED ORDER — FENTANYL CITRATE (PF) 100 MCG/2ML IJ SOLN
INTRAMUSCULAR | Status: AC
  Filled 2023-09-13: qty 2

## 2023-09-13 MED ORDER — SODIUM CHLORIDE 0.9% FLUSH: 3.0000 mL | Freq: Two times a day (BID) | INTRAVENOUS | Status: DC

## 2023-09-13 MED ORDER — IODIXANOL 320 MG/ML IV SOLN
INTRAVENOUS | Status: DC | PRN
  Administered 2023-09-13: 55 mL

## 2023-09-13 MED ORDER — SODIUM CHLORIDE 0.9 % IV SOLN: 250.0000 mL | INTRAVENOUS | Status: DC | PRN

## 2023-09-13 MED ORDER — LIDOCAINE HCL (PF) 1 % IJ SOLN
INTRAMUSCULAR | Status: AC
  Filled 2023-09-13: qty 30

## 2023-09-13 SURGICAL SUPPLY — 13 items
BALLN MUSTANG 7X80X75 (BALLOONS) ×2
BALLOON MUSTANG 7X80X75 (BALLOONS) IMPLANT
CATH ANGIO 5F BER2 65CM (CATHETERS) IMPLANT
COVER DOME SNAP 22 D (MISCELLANEOUS) IMPLANT
GUIDEWIRE ANGLED .035X150CM (WIRE) IMPLANT
GUIDEWIRE NITREX 0.018X80X5 (WIRE) IMPLANT
KIT ENCORE 26 ADVANTAGE (KITS) IMPLANT
KIT MICROPUNCTURE NIT STIFF (SHEATH) IMPLANT
SHEATH PINNACLE 5F 10CM (SHEATH) IMPLANT
SHEATH PROBE COVER 6X72 (BAG) IMPLANT
TRAY PV CATH (CUSTOM PROCEDURE TRAY) ×3 IMPLANT
WIRE BENTSON .035X145CM (WIRE) IMPLANT
WIRE TORQFLEX AUST .018X40CM (WIRE) IMPLANT

## 2023-09-13 NOTE — Interval H&P Note (Signed)
History and Physical Interval Note:  09/13/2023 10:29 AM  [name]  has presented today for surgery, with the diagnosis of arm swelling.  The various methods of treatment have been discussed with the patient and family. After consideration of risks, benefits and other options for treatment, the patient has consented to  Procedure(s): UPPER EXTREMITY VENOGRAPHY (N/A) as a surgical intervention.  [name] history has been reviewed, patient examined, no change in status, stable for surgery.  I have reviewed the patient's chart and labs.  Questions were answered to the patient's satisfaction.     Lemar Livings

## 2023-09-13 NOTE — Discharge Instructions (Signed)
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
## 2023-09-14 ENCOUNTER — Encounter (HOSPITAL_COMMUNITY): Payer: Self-pay | Admitting: Vascular Surgery

## 2023-09-15 NOTE — Telephone Encounter (Signed)
Pt post op scheduled

## 2023-09-16 ENCOUNTER — Ambulatory Visit (HOSPITAL_COMMUNITY)
Admission: RE | Admit: 2023-09-16 | Discharge: 2023-09-16 | Disposition: A | Discharge status: Home / Self Care | Payer: Medicare Other | Source: Ambulatory Visit | Attending: Family | Admitting: Family

## 2023-09-16 DIAGNOSIS — I471 Supraventricular tachycardia, unspecified: Secondary | ICD-10-CM | POA: Diagnosis not present

## 2023-09-16 DIAGNOSIS — R0609 Other forms of dyspnea: Secondary | ICD-10-CM | POA: Diagnosis not present

## 2023-09-16 DIAGNOSIS — R072 Precordial pain: Secondary | ICD-10-CM | POA: Insufficient documentation

## 2023-09-16 DIAGNOSIS — R002 Palpitations: Secondary | ICD-10-CM | POA: Insufficient documentation

## 2023-09-16 LAB — ECHOCARDIOGRAM COMPLETE
Area-P 1/2: 2.87 cm2
S' Lateral: 3.2 cm

## 2023-09-16 NOTE — Progress Notes (Signed)
*  PRELIMINARY RESULTS* Echocardiogram 2D Echocardiogram has been performed. Could not obtain an I.V. x 2 different nurse attempts.  Stacey Drain 09/16/2023, 3:07 PM

## 2023-09-29 ENCOUNTER — Other Ambulatory Visit: Payer: Self-pay | Admitting: *Deleted

## 2023-09-29 DIAGNOSIS — M79601 Pain in right arm: Secondary | ICD-10-CM

## 2023-09-29 NOTE — Addendum Note (Signed)
Addended by: Thomasena Edis on: 09/29/2023 07:47 AM   Modules accepted: Orders

## 2023-10-04 ENCOUNTER — Ambulatory Visit: Payer: Medicare Other | Attending: Nurse Practitioner | Admitting: Nurse Practitioner

## 2023-10-04 ENCOUNTER — Encounter: Payer: Self-pay | Admitting: Nurse Practitioner

## 2023-10-04 VITALS — BP 107/77 | HR 91 | Ht 64.0 in | Wt 188.2 lb

## 2023-10-04 DIAGNOSIS — G540 Brachial plexus disorders: Secondary | ICD-10-CM

## 2023-10-04 DIAGNOSIS — R0789 Other chest pain: Secondary | ICD-10-CM | POA: Diagnosis not present

## 2023-10-04 DIAGNOSIS — R002 Palpitations: Secondary | ICD-10-CM | POA: Diagnosis not present

## 2023-10-04 DIAGNOSIS — E785 Hyperlipidemia, unspecified: Secondary | ICD-10-CM

## 2023-10-04 DIAGNOSIS — I959 Hypotension, unspecified: Secondary | ICD-10-CM

## 2023-10-04 DIAGNOSIS — Z86718 Personal history of other venous thrombosis and embolism: Secondary | ICD-10-CM

## 2023-10-04 DIAGNOSIS — Z86711 Personal history of pulmonary embolism: Secondary | ICD-10-CM

## 2023-10-04 NOTE — Patient Instructions (Signed)
Medication Instructions:  Your physician recommends that you continue on your current medications as directed. Please refer to the Current Medication list given to you today.  *If you need a refill on your cardiac medications before your next appointment, please call your pharmacy*   Lab Work: NONE   If you have labs (blood work) drawn today and your tests are completely normal, you will receive your results only by: MyChart Message (if you have MyChart) OR A paper copy in the mail If you have any lab test that is abnormal or we need to change your treatment, we will call you to review the results.   Testing/Procedures: NONE    Follow-Up: At [facility], you and your health needs are our priority.  As part of our continuing mission to provide you with exceptional heart care, we have created designated Provider Care Teams.  These Care Teams include your primary Cardiologist (physician) and Advanced Practice Providers (APPs -  Physician Assistants and Nurse Practitioners) who all work together to provide you with the care you need, when you need it.  We recommend signing up for the patient portal called "MyChart".  Sign up information is provided on this After Visit Summary.  MyChart is used to connect with patients for Virtual Visits (Telemedicine).  Patients are able to view lab/test results, encounter notes, upcoming appointments, etc.  Non-urgent messages can be sent to your provider as well.   To learn more about what you can do with MyChart, go to [email or web address].    Your next appointment:   2 month(s)  Provider:   Luane School, MD    Other Instructions Thank you for choosing [facility]!

## 2023-10-04 NOTE — Progress Notes (Addendum)
Cardiology Office Note:  .   Date:  10/04/2023 ID:  [name], DOB March 13, 1961, MRN [id number] PCP: Kerri Perches, MD  [facility] Providers Cardiologist:  Christell Constant, MD Electrophysiologist:  Sherryl Manges, MD    History of Present Illness: .   [name] is a 62 y.o. female with a PMH of palpitations, hyperlipidemia, chest pain, prior history of DVT, hx of PE in 1990's, s/p IVC filter in 2006, SVT, ?A-fib/A-flutter, thyroid disease, Sjogren's, fibromyalgia, IDA, hx of stroke, migraines, breast cancer, hx of syncope, ovarian cancer misdiagnosis (multiple GI complications and surgeries, with remote hx of sepsis and renal failure), who presents today for scheduled follow-up.  Remote self-reported history of ablations in late 1990's. Previously followed by Dr. Graciela Husbands. In past HR was found to be in 200-300 range and was sent to [facility] for further evaluation. Records show only history of "tachycardia ablation." Seen by EP in 2016-2017 for syncopal episode and atypical chest pain. Syncopal episode was felt to be vasovagal. NST in 2016 negative for ischemia. Last seen by EP 08/2020.   In addition to history mentioned above, pt has a remote hx of PE in late 1990's. Had hemorrhaging during treatment for breast cancer, therefore IVC filter placed in 2006. Her [place] filter eroded through vena cava, was beginning to ossify in her spine. Was evaluated 10/2018 for pre-op clearance for its removal.   History of current chest pain, CCTA 12/2018 revealed calcium score of 0. Repeat study CTA 02/2022 showed repeat calcium score of 0.   Last seen by Gillian Shields, NP on April 16, 2023.  Patient noted 1 time daily episode of chest pain with radiation to the jaw, would wake her up at night.  Patient noted heart rate felt irregular and fast during episode, occurred with activity and rest, with episodes lasting longer than previous.  Was instructed to continue Toprol 25 mg  daily with instructions to take additional 12.5 mg as needed for breakthrough palpitations.  Underwent peripheral vascular catheterization on 09/16/2023 by Dr. Randie Heinz. This was done because she had undergone evaluation for right upper extremity swelling and numbness, along with tingling of her fingers. With concern for thoracic outlet syndrome, was indicated for venogram of right upper extremity.  During the procedure, it was found that one of her brachial veins in her basilic vein was likely chronically thrombosed due to previous PICC line placement.  It was also felt that her history of cervical neck injury and spinal instrumentation may be causing some of her symptoms as well.  It was felt that the majority of her issues may also be secondary to his thoracic outlet syndrome.  Successful balloon angioplasty performed to right subclavian and axillary veins.   Today she presents for scheduled follow-up.  She states she is following [facility], will be undergoing future workup for most likely TOS that is felt to be causing her episodes of severe chest pain. Continues to note palpitations that seem to be worse, has been taking an extra 25 mg of Metoprolol succinate in evening instead of half a tablet as prescribed. Says she notices low SBP at times as a result of this medication (SBP averaging 80-90's), says she knows her limitations and knows she cannot drive and knows how to care for herself. Denies LOC, shortness of breath, syncope, orthopnea, PND, significant weight changes, acute bleeding, or claudication.   ROS: Negative. See HPI.  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  Studies Reviewed: .  Echo 08/2023:   1. Left ventricular ejection fraction, by estimation, is 55 to 60%. The  left ventricle has normal function. The left ventricle has no regional  wall motion abnormalities. Left ventricular diastolic parameters are  consistent with Grade I diastolic  dysfunction (impaired relaxation).   2. Right  ventricular systolic function is normal. The right ventricular  size is normal. Tricuspid regurgitation signal is inadequate for assessing PA pressure.   3. The mitral valve is grossly normal. Trivial mitral valve  regurgitation.   4. The aortic valve is tricuspid. Aortic valve regurgitation is not  visualized.   5. The inferior vena cava is normal in size with greater than 50%  respiratory variability, suggesting right atrial pressure of 3 mmHg.   Comparison(s): Prior images reviewed side by side. LVEF normal range at 55-60%.  Vascular ultrasound upper extremity 07/2023: Right:  No evidence of deep or superficial vein thrombosis. Distal radial and  ulnar arteries are normal.  Physical Exam:   VS:  BP 107/77   Pulse 91   Ht 5\' 4"  (1.626 m)   Wt 188 lb 3.2 oz (85.4 kg)   BMI 32.30 kg/m    Wt Readings from Last 3 Encounters:  10/04/23 188 lb 3.2 oz (85.4 kg)  09/13/23 191 lb (86.6 kg)  09/07/23 193 lb 9.6 oz (87.8 kg)    GEN: Obese, 62 y.o. female in no acute distress NECK: No JVD; No carotid bruits CARDIAC: S1/S2, RRR, no murmurs, rubs, gallops RESPIRATORY:  Clear to auscultation without rales, wheezing or rhonchi  ABDOMEN: Soft, non-tender, non-distended EXTREMITIES:  No edema; No deformity   ASSESSMENT AND PLAN: .    Atypical chest pain, thoracic outlet syndrome CCTA 02/2022 showed coronary calcium score of 0. Admits to similar episodes of chest pain from previous office visit. Recent  peripheral vascular catheterization showed chronic thrombosis from previous PICC line placement, underwent balloon angioplasty, evidence of thoracic outlet syndrome noted. No complications post procedure. Continue to follow-up with Dr. Randie Heinz, she is scheduled for upcoming duplex later this month. No medication changes at this time. Heart healthy diet and regular cardiovascular exercise encouraged.   Palpitations, hypotension Notes worsening palpitations over time. Previous Zio showed predominant  NSR, 5 SVT episodes, brief in duration.  Triggered episodes happened with sinus rhythm or PAC's. Was recommended to continue Metoprolol succinate 25 mg daily and instructed to take 1/2 tablet daily PRN for breakthrough palpitations. Notes low BP occasionally as she has stated she taking 25 mg of Toprol XL in evening consistently instead of extra 1/2 tablet daily PRN. Discussed risks of extra dose with this lowering her BP. Pt verbalized understanding. Recommended to continue current dosage of Metoprolol succinate as previously listed and prescribed. Last seen by Dr. Graciela Husbands (electrophysiology) in 2021. Will route note to attending cardiologist to see if EP referral is recommended. Care and ED precautions discussed.  Addendum: Dr. Jenene Slicker was consulted. Dr. Jenene Slicker stated, "Recommend to wear a smart watch with EKG lead functionality on. No recurrent arrhythmias since 1997. Doubt these palpitations are arrhythmias. Event monitor unremarkable. Not sure what EP will do differently. Hold off on EP referral unless patient requests." Will hold off EP referral at this time.   HLD Continue current medication regimen. This is being managed by her PCP. Continue to follow with PCP.  Hx of DVT/PE Continue Xarelto 20 mg daily. Denies any bleeding issues. Continue to follow with PCP.  Dispo: Patient is requesting to switch cardiologists to receive care closer to home. Wants  to be seen in [place] office. Follow-up with MD/APP in 2 months or sooner if anything changes.   Signed, Sharlene Dory, NP

## 2023-10-07 ENCOUNTER — Inpatient Hospital Stay: Payer: Medicare Other | Attending: Hematology | Admitting: Licensed Clinical Social Worker

## 2023-10-07 DIAGNOSIS — Z6832 Body mass index (BMI) 32.0-32.9, adult: Secondary | ICD-10-CM | POA: Diagnosis not present

## 2023-10-07 DIAGNOSIS — R1084 Generalized abdominal pain: Secondary | ICD-10-CM | POA: Diagnosis not present

## 2023-10-07 DIAGNOSIS — Z79899 Other long term (current) drug therapy: Secondary | ICD-10-CM | POA: Diagnosis not present

## 2023-10-07 DIAGNOSIS — R030 Elevated blood-pressure reading, without diagnosis of hypertension: Secondary | ICD-10-CM | POA: Diagnosis not present

## 2023-10-07 DIAGNOSIS — E6609 Other obesity due to excess calories: Secondary | ICD-10-CM | POA: Diagnosis not present

## 2023-10-11 ENCOUNTER — Ambulatory Visit: Payer: Medicare Other | Admitting: Orthopedic Surgery

## 2023-10-11 ENCOUNTER — Other Ambulatory Visit: Payer: Self-pay | Admitting: Family Medicine

## 2023-10-13 ENCOUNTER — Encounter: Payer: Self-pay | Admitting: Vascular Surgery

## 2023-10-13 ENCOUNTER — Ambulatory Visit (HOSPITAL_COMMUNITY)
Admission: RE | Admit: 2023-10-13 | Discharge: 2023-10-13 | Disposition: A | Discharge status: Home / Self Care | Payer: Medicare Other | Source: Ambulatory Visit | Attending: Vascular Surgery | Admitting: Vascular Surgery

## 2023-10-13 ENCOUNTER — Ambulatory Visit (INDEPENDENT_AMBULATORY_CARE_PROVIDER_SITE_OTHER): Payer: Medicare Other | Admitting: Vascular Surgery

## 2023-10-13 VITALS — BP 103/78 | HR 91 | Temp 98.7°F | Ht 64.0 in | Wt 190.1 lb

## 2023-10-13 DIAGNOSIS — M79601 Pain in right arm: Secondary | ICD-10-CM | POA: Diagnosis not present

## 2023-10-13 DIAGNOSIS — M7989 Other specified soft tissue disorders: Secondary | ICD-10-CM | POA: Diagnosis not present

## 2023-10-13 DIAGNOSIS — G540 Brachial plexus disorders: Secondary | ICD-10-CM
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
## 2023-10-14 ENCOUNTER — Telehealth: Payer: Self-pay | Admitting: Family Medicine

## 2023-10-14 NOTE — Telephone Encounter (Signed)
Physicals cannot be video. She can reschedule her CPE to another time if she needs video or change her current appt to an regular office visit if she needs video

## 2023-10-14 NOTE — Telephone Encounter (Signed)
Patient calling says she has an appt tomorrow with cancer center that she doesn't want to miss but also does not want to miss her appt here- pt said Dr Lodema Hong had mentioned something about a video visit for her physical. Please advise Thank  you

## 2023-10-15 ENCOUNTER — Encounter: Payer: Self-pay | Admitting: Family Medicine

## 2023-10-15 ENCOUNTER — Telehealth (INDEPENDENT_AMBULATORY_CARE_PROVIDER_SITE_OTHER): Payer: Medicare Other | Admitting: Family Medicine

## 2023-10-15 DIAGNOSIS — N3010 Interstitial cystitis (chronic) without hematuria: Secondary | ICD-10-CM

## 2023-10-15 DIAGNOSIS — E782 Mixed hyperlipidemia: Secondary | ICD-10-CM | POA: Diagnosis not present

## 2023-10-15 DIAGNOSIS — E039 Hypothyroidism, unspecified: Secondary | ICD-10-CM | POA: Diagnosis not present

## 2023-10-15 DIAGNOSIS — E559 Vitamin D deficiency, unspecified: Secondary | ICD-10-CM | POA: Diagnosis not present

## 2023-10-15 DIAGNOSIS — Z87440 Personal history of urinary (tract) infections: Secondary | ICD-10-CM

## 2023-10-15 DIAGNOSIS — R300 Dysuria: Secondary | ICD-10-CM

## 2023-10-15 DIAGNOSIS — N390 Urinary tract infection, site not specified: Secondary | ICD-10-CM

## 2023-10-15 MED ORDER — CIPROFLOXACIN HCL 500 MG PO TABS: 500.0000 mg | ORAL_TABLET | Freq: Two times a day (BID) | ORAL | 0 refills | Status: AC

## 2023-10-15 NOTE — Progress Notes (Unsigned)
Virtual Visit via Video Note  I connected with Aprille Gornick on 10/15/23 at  3:00 PM EDT by a video enabled telemedicine application and verified that I am speaking with the correct person using two identifiers.  Location: Patient: home Provider: office   I discussed the limitations of evaluation and management by telemedicine and the availability of in person appointments. The patient expressed understanding and agreed to proceed.  History of Present Illness: Reports recurrent uTI symptoms on avg at least once per month for this year, in the past 3 months symptomatic twice per month this month she has used keflex and septra , lst abx was approx 4 daysconstant burning , dysuriafatigue today, chills, 100, requests referral to Urology due to frequency of infections as well as treatment for presumed current infection Chronic RUE swelling being managed by Vacular and may need definitive surgery   Observations/Objective:  There were no vitals taken for this visit. Good communication with no confusion and intact memory. Alert and oriented x 3 No signs of respiratory distress during speech  Assessment and Plan: Dysuria Symptomatic with h/o recurrent UTI Start antibiotic  presumptively due to symptoms presented, unfortunately unable to bring specimen to office today Will bring specimen in 3 days Advised to go to ED if symptoms worsen or faill to improve  Recurrent UTI (urinary tract infection) H/o recurrent UTI's as well as intersitial cystitis, refer to local Urology for re evaluation  Interstitial cystitis Refer to urology for re evauation  Hypothyroidism Updated lab needed at/ before next visit.   Hyperlipidemia Hyperlipidemia:Low fat diet discussed and encouraged.   Lipid Panel  Lab Results  Component Value Date   CHOL 166 07/09/2023   HDL 42 07/09/2023   LDLCALC 55 07/09/2023   LDLDIRECT 138 (H) 09/15/2012   TRIG 464 (H) 07/09/2023   CHOLHDL 4.0 07/09/2023      Updated lab needed at/ before next visit.   Vitamin D deficiency Controlled, no change in medication    Follow Up Instructions:    I discussed the assessment and treatment plan with the patient. The patient was provided an opportunity to ask questions and all were answered. The patient agreed with the plan and demonstrated an understanding of the instructions.   The patient was advised to call back or seek an in-person evaluation if the symptoms worsen or if the condition fails to improve as anticipated.  I provided 15 minutes of non-face-to-face time during this encounter.   Syliva Overman, MD

## 2023-10-17 ENCOUNTER — Encounter: Payer: Self-pay | Admitting: Family Medicine

## 2023-10-17 DIAGNOSIS — N390 Urinary tract infection, site not specified: Secondary | ICD-10-CM | POA: Insufficient documentation

## 2023-10-17 NOTE — Patient Instructions (Signed)
Annual exam past due , please schedule  Wellness visit past due please schedule  You are referred to Urology as discussed  You need to submit urine for testing on 10/21, go to ED if symptoms worsen or fail to improve  You need fasting lipid profile and hepatic panel on 10/18/2023

## 2023-10-17 NOTE — Assessment & Plan Note (Signed)
Controlled, no change in medication  

## 2023-10-17 NOTE — Assessment & Plan Note (Signed)
Updated lab needed at/ before next visit.   

## 2023-10-17 NOTE — Assessment & Plan Note (Signed)
Symptomatic with h/o recurrent UTI Start antibiotic  presumptively due to symptoms presented, unfortunately unable to bring specimen to office today Will bring specimen in 3 days Advised to go to ED if symptoms worsen or faill to improve

## 2023-10-17 NOTE — Assessment & Plan Note (Signed)
H/o recurrent UTI's as well as intersitial cystitis, refer to local Urology for re evaluation

## 2023-10-17 NOTE — Assessment & Plan Note (Signed)
Refer to urology for re evauation

## 2023-10-17 NOTE — Assessment & Plan Note (Signed)
Hyperlipidemia:Low fat diet discussed and encouraged.   Lipid Panel  Lab Results  Component Value Date   CHOL 166 07/09/2023   HDL 42 07/09/2023   LDLCALC 55 07/09/2023   LDLDIRECT 138 (H) 09/15/2012   TRIG 464 (H) 07/09/2023   CHOLHDL 4.0 07/09/2023     Updated lab needed at/ before next visit.

## 2023-10-18 ENCOUNTER — Other Ambulatory Visit: Payer: Self-pay

## 2023-10-18 ENCOUNTER — Encounter: Payer: Self-pay | Admitting: Physician Assistant

## 2023-10-18 DIAGNOSIS — E669 Obesity, unspecified: Secondary | ICD-10-CM | POA: Diagnosis not present

## 2023-10-18 DIAGNOSIS — R300 Dysuria: Secondary | ICD-10-CM | POA: Diagnosis not present

## 2023-10-18 DIAGNOSIS — N3000 Acute cystitis without hematuria: Secondary | ICD-10-CM | POA: Diagnosis not present

## 2023-10-18 DIAGNOSIS — Z6833 Body mass index (BMI) 33.0-33.9, adult: Secondary | ICD-10-CM | POA: Diagnosis not present

## 2023-10-19 NOTE — Progress Notes (Deleted)
Name: [name] DOB: August 30, 1961 MRN: [id number]  History of Present Illness: [name] is a 62 y.o. female who presents today for new patient visit at [facility]. - Past medical history includes: Interstitial cystitis. Previously seen by Dr. Logan Bores and Dr. Retta Diones >20 years ago. Complex GI / surgical history. Per GI note on 08/09/2023: "partial colectomy due to redundant colon with primary anastomosis, perforated bowel r/t gynecologic procedure complicated by enterocutaneous fistula that required multiple surgeries and multiple SBOs with recent admission for partial SBO 10/25/21, gastroparesis, small bowel dysmotility, history of pulmonary embolism and DVT, who presents for follow up of small bowel dysmotility and gastroparesis." Constipation. Chronic abdominal pain. Previously seen at "[facility] for consideration of small bowel transplantation".   Recent history:  > 04/22/2023: CT abdomen/pelvis w/o contrast showed no GU stones, masses, or hydronephrosis. Unremarkable bladder.  > 10/15/2023: Telemedicine visit.  - She reported recurrent UTI symptoms "on avg at least once per month for this year, in the past 3 months symptomatic twice per month this month she has used keflex and septra , lst abx was approx 4 daysconstant burning , dysuriafatigue today, chills, 100, requests referral to Urology due to frequency of infections as well as treatment for presumed current infection". - She was prescribed Cipro for possible UTI, however no urine testing was performed.   > ***No positive urine culture results in past 12 months found per chart review.   Today: She reports *** episodes of UTI-like symptoms over the past year and states she has been treated with antibiotics for suspected UTIs *** times.   Bladder pain is described as ***/10, ***, *** constant I intermittent***. She {Actions; denies-reports:120008} increased urinary urgency, frequency, nocturia, dysuria, gross  hematuria, hesitancy, straining to void, or sensations of incomplete emptying. She urinates *** times per day and *** times at night.   Current treatment regimen includes: - Hydromorphone (Dilaudid) 4 mg (#90 dispensed 09/15/2023 per PMP aware controlled substance registry) - Xanax 1 mg (#60 dispensed 09/25/2023 per PMP aware controlled substance registry) - Cymbalta - Flexeril - Zyrtec  Previous treatments have included: - Klonopin - Valium - Ativan - Trazodone - Bentyl - Pyridium  - topical vaginal estrogen cream   *** Elmiron Hydroxyzine Singulair Ditropan / Myrbetriq bladder instillations topical Lidocaine ointment/jelly Urogesic blue PRN / Uribel PRN pelvic floor physical therapy cysto/HOD ***  Fall Screening: Do you usually have a device to assist in your mobility? {yes/no:20286} ***cane / ***walker / ***wheelchair  Medications: Current Outpatient Medications  Medication Sig Dispense Refill   ALPRAZolam (XANAX) 1 MG tablet Take 1 tablet (1 mg total) by mouth 2 (two) times daily. 60 tablet 5   aspirin 81 MG chewable tablet Chew 81 mg by mouth daily.     butalbital-acetaminophen-caffeine (FIORICET) 50-325-40 MG tablet Take 1 tablet by mouth every 6 (six) hours as needed for headache.     Cholecalciferol (VITAMIN D3) 1.25 MG (50000 UT) CAPS Take 1 capsule by mouth every 30 (thirty) days.     ciprofloxacin (CIPRO) 500 MG tablet Take 1 tablet (500 mg total) by mouth 2 (two) times daily for 5 days. 10 tablet 0   conjugated estrogens (PREMARIN) vaginal cream INSERT 1 APPLICATORFUL VAGINALLY DAILY (Patient taking differently: Place 1 applicator vaginally at bedtime.) 30 g 2   cyanocobalamin (VITAMIN B12) 1000 MCG/ML injection Inject 1 mL (1,000 mcg total) into the muscle every 30 (thirty) days. 1 mL 11   cyclobenzaprine (FLEXERIL) 10 MG tablet Take 1 tablet (  10 mg total) by mouth 2 (two) times daily as needed for muscle spasms. (Patient taking differently: Take 10 mg by mouth 2  (two) times daily.) 180 tablet 0   cycloSPORINE (RESTASIS) 0.05 % ophthalmic emulsion Place 1 drop into both eyes 2 (two) times daily as needed (dry eyes).     DULoxetine (CYMBALTA) 60 MG capsule Take 60 mg by mouth daily.     erythromycin (ERY-TAB) 250 MG EC tablet TAKE 1 TABLET BY MOUTH THREE TIMES DAILY BEFORE MEALS (Patient taking differently: Take 250 mg by mouth 3 (three) times daily.) 270 tablet 2   ezetimibe (ZETIA) 10 MG tablet TAKE 1 TABLET(10 MG) BY MOUTH DAILY 90 tablet 3   fenofibrate (TRICOR) 48 MG tablet Take 1 tablet (48 mg total) by mouth daily. (Patient not taking: Reported on 10/15/2023) 30 tablet 5   furosemide (LASIX) 20 MG tablet Take 1 tablet (20 mg total) by mouth daily. (Patient taking differently: Take 20 mg by mouth 2 (two) times daily.) 30 tablet 3   HYDROmorphone (DILAUDID) 4 MG tablet Take 4 mg by mouth 3 (three) times daily.     levothyroxine (SYNTHROID) 125 MCG tablet TAKE 1 TABLET(125 MCG) BY MOUTH DAILY BEFORE BREAKFAST 90 tablet 1   lubiprostone (AMITIZA) 24 MCG capsule TAKE 1 CAPSULE(24 MCG) BY MOUTH TWICE DAILY WITH A MEAL 60 capsule 5   magnesium oxide (MAG-OX) 400 (240 Mg) MG tablet TAKE 1 TABLET BY MOUTH FOUR TIMES DAILY (Patient taking differently: Take 400 mg by mouth 2 (two) times daily.) 180 tablet 0   MegaRed Omega-3 Krill Oil 500 MG CAPS Take 500 mg by mouth at bedtime.     Melatonin 10 MG TABS Take 10 mg by mouth at bedtime as needed (Sleep).     metFORMIN (GLUCOPHAGE-XR) 500 MG 24 hr tablet Take 500 mg by mouth 2 (two) times daily with a meal.     metoprolol succinate (TOPROL-XL) 25 MG 24 hr tablet TAKE 1 TABLET(25 MG) BY MOUTH DAILY (Patient taking differently: Take 25 mg by mouth 2 (two) times daily.) 90 tablet 2   Multiple Vitamins-Minerals (ICAPS AREDS 2 PO) Take 1 capsule by mouth at bedtime.     naloxone (NARCAN) nasal spray 4 mg/0.1 mL Place 1 spray into the nose as needed (opioid reversal).     omeprazole (PRILOSEC) 40 MG capsule Take 1 capsule  (40 mg total) by mouth in the morning and at bedtime. 180 capsule 3   OVER THE COUNTER MEDICATION Take 2 tablets by mouth 2 (two) times daily. Nutra-ful     phenazopyridine (PYRIDIUM) 95 MG tablet Take by mouth 3 (three) times daily as needed. sd     Polyethyl Glycol-Propyl Glycol (SYSTANE ULTRA) 0.4-0.3 % SOLN Place 1 drop into both eyes daily.     polyethylene glycol (MIRALAX / GLYCOLAX) 17 g packet Take 17 g by mouth daily as needed for mild constipation or moderate constipation.     potassium chloride (KLOR-CON M) 10 MEQ tablet Take 1 tablet (10 mEq total) by mouth 2 (two) times daily. 180 tablet 3   pravastatin (PRAVACHOL) 20 MG tablet TAKE 1 TABLET(20 MG) BY MOUTH DAILY 90 tablet 1   promethazine (PHENERGAN) 12.5 MG tablet Take 12.5 mg by mouth every 6 (six) hours as needed for nausea or vomiting.     sennosides-docusate sodium (SENOKOT-S) 8.6-50 MG tablet Take 2 tablets by mouth 3 (three) times daily.     XARELTO 20 MG TABS tablet TAKE 1 TABLET(20 MG) BY MOUTH DAILY  30 tablet 1   No current facility-administered medications for this visit.    Allergies: Allergies  Allergen Reactions   Nitrofurantoin Hives   Crestor [Rosuvastatin Calcium] Other (See Comments)    Generalized cramps   Bisacodyl Other (See Comments)    Makes patient feel like she is having cramps    Codeine Itching   Iron Sucrose Other (See Comments)    Flushing- required benadryl and solu medrol   Monosodium Glutamate Other (See Comments)    Cluster migraines    Scopolamine Hbr Other (See Comments)    Cluster migraines, impaired vision   Morphine Rash   Other Hives, Itching, Swelling, Rash and Other (See Comments)    Cigarette smoke    Past Medical History:  Diagnosis Date   Abdominal hernia 08/14/2019   Abdominal pain 06/06/2019   Acute bronchitis 05/01/2013   Acute cystitis 06/09/2010   Qualifier: Diagnosis of  By: Lillia Mountain LPN, Brandi     Acute renal insufficiency 08/28/2011   Allergy    Phreesia  03/16/2021   Anemia    Phreesia 10/29/2020   Anxiety    Anxiety state 03/16/2008   Qualifier: Diagnosis of  By: Lind Guest     Arthritis    Phreesia 10/29/2020   Back pain with radiation 07/17/2014   Bilateral hand pain 07/27/2016   Blood transfusion without reported diagnosis    Phreesia 10/29/2020   Breast cancer (HCC)    L breast- 2006   Cancer (HCC)    Phreesia 10/29/2020   Carpal tunnel syndrome    Chronic constipation    Chronic pain syndrome    followed by [facility]---  back   Clotting disorder (HCC)    Phreesia 10/29/2020   Depression    Dermatitis 12/15/2011   Difficult intubation    Educated about COVID-19 virus infection 05/14/2019   Fall at home 01/26/2016   Family history of adverse reaction to anesthesia    MOTHER--- PONV   Fatigue 09/17/2012   Fibromyalgia    FIBROMYALGIA 03/16/2008   Qualifier: Diagnosis of  By: Lind Guest     GERD (gastroesophageal reflux disease)    Headache disorder 01/16/2013   Headache syndrome 01/26/2016   Hip pain, right 07/08/2019   History of MRSA infection    lip abscess   History of ovarian cyst 06/2011   s/p  BSO   History of pulmonary embolus (PE) 1997   post EP with ablation pulmonary veouns for SVT/ Atrial Fib.   History of supraventricular tachycardia    s/p  ablation 1996  and 1997  by dr Graciela Husbands   History of TIA (transient ischemic attack) 1997   post op EP ablation PE   Hyperlipidemia    Hyperlipidemia LDL goal <100 03/16/2008   Qualifier: Diagnosis of  By: Lind Guest     Hypothyroidism    followed by pcp   Incisional hernia    Insomnia 12/15/2011   Intermittent palpitations 08/22/2017   Interstitial cystitis    09-13-2018   per pt last flare-up  May 2019 (followed by pcp)   INTERSTITIAL CYSTITIS 02/17/2011   Qualifier: Diagnosis of  By: Lodema Hong MD, Margaret     Iron deficiency anemia    09-13-2018  PER PT STABLE   Irregular heart rate 07/25/2013   Lipoma of back    upper   Low back  pain radiating to right leg 07/04/2019   Low ferritin level 06/14/2014   MDD (major depressive disorder), single episode, in full remission (HCC)  10/27/2017   Medically noncompliant 02/28/2015   Multiple missed appointments, both follow-up appointments and lab appointments.    Metabolic syndrome X 02/10/2011   Qualifier: Diagnosis of  By: Lodema Hong MD, Margaret  hBA1c is 5.8 in 02/2013    Migraines    Morbid obesity (HCC) 03/27/2013   Muscle spasm 01/03/2020   Nausea alone 07/17/2014   NECK PAIN, CHRONIC 03/16/2008   Qualifier: Diagnosis of  By: Lind Guest     Normal coronary arteries    a. by CT 12/2018.   Obesity 03/16/2008   Qualifier: Diagnosis of  By: Lind Guest     Oral ulceration 08/23/2014   Presented at 06/14/2014 visit    Other malaise and fatigue 03/16/2008   Centricity Description: FATIGUE, CHRONIC Qualifier: Diagnosis of  By: Lind Guest   Centricity Description: FATIGUE Qualifier: Diagnosis of  By: Garnette Czech PA, Dawn     OVARIAN CYST 12/26/2009   Qualifier: Diagnosis of  By: Lodema Hong MD, Margaret     Paget disease of breast, left [facility])    Paget's disease of breast, left (HCC) 03/16/2008   Qualifier: Diagnosis of  By: Lind Guest  Left diagnosed in 2006 F/h breast cancer x 15 family members   Partial small bowel obstruction (HCC) 07/04/2012   PONV (postoperative nausea and vomiting)    SEVERE   Postsurgical menopause 11/13/2011   Presence of IVC filter 06/06/2019   PSVT (paroxysmal supraventricular tachycardia) (HCC)    HX ABLATION 1996 AND 1997   Recurrent oral herpes simplex infection 10/13/2017   ROM (right otitis media) 01/23/2013   S/P insertion of IVC (inferior vena caval) filter 05/08/2005   [place] (non-retrievable)  /  dx 2019  a leg of filter is protruding thru the vena cava in to right L2 vertebral body (09-13-2018  per pt having surgery to remove filter in [place])   S/P radiofrequency ablation operation for arrhythmia 1996   1996  and 1997,    SVT and Atrial Fib   Sinusitis, chronic 01/26/2016   SMALL BOWEL OBSTRUCTION, HX OF 08/07/2008   Annotation: obstruction w/ adhesions led to partial colectomy Qualifier: Diagnosis of  By: Minna Merritts     Stroke [facility])    Phreesia 10/29/2020, TIAs in the past   Swelling of hand 09/17/2012   Syncope    Tachycardia 02/03/2010   Qualifier: Diagnosis of  By: Via LPN, Lynn     Thyroid disease    Phreesia 10/29/2020   TOS (thoracic outlet syndrome)    Ulcer 12/15/2011   Urinary frequency 07/18/2014   Vaginitis and vulvovaginitis 05/10/2013   Vitamin D deficiency 05/05/2015   Wears glasses    Past Surgical History:  Procedure Laterality Date   ABDOMINAL HYSTERECTOMY  1987   ANTERIOR CERVICAL DECOMP/DISCECTOMY FUSION  03-07-2002   dr elsner  @[facility]    C 4 -- 5   APPENDECTOMY  1980   AUGMENTATION MAMMAPLASTY Right 2006   BILATERAL SALPINGOOPHORECTOMY  07/24/2011   via Explor. Lap. w/ intraoperative perf. bowel repair   BIOPSY  05/02/2021   Procedure: BIOPSY;  Surgeon: Marguerita Merles, Reuel Boom, MD;  [facility];  Service: Gastroenterology;;  small bowel, mid esophagus, distal esophagus, random colon biopsies   BIOPSY  12/04/2021   Procedure: BIOPSY;  Surgeon: Lanelle Bal, DO;  [facility]: [facility];  Service: Endoscopy;;   BIOPSY  03/31/2022   Procedure: BIOPSY;  Surgeon: Dolores Frame, MD;  [facility];  Service: Gastroenterology;;   BOTOX INJECTION N/A 03/31/2022  Procedure: BOTOX INJECTION;  Surgeon: Marguerita Merles, Reuel Boom, MD;  [facility];  Service: Gastroenterology;  Laterality: N/A;   BREAST BIOPSY Right 2019   benign   BREAST ENHANCEMENT SURGERY Bilateral 1993   BREAST IMPLANT REMOVAL Bilateral    BREAST SURGERY N/A    Phreesia 10/29/2020   CARDIAC CATHETERIZATION  07-06-2003   dr Smitty Cords brodie   normal coronaries and LVF   CARDIAC ELECTROPHYSIOLOGY STUDY AND ABLATION  1996  and 1997   CARDIOVASCULAR STRESS TEST  11-18-2015    dr Graciela Husbands   normal nuclear study w/ no ischemia/  normal LV function and wall motion , ef 84%   CARPAL TUNNEL RELEASE Right ?   CESAREAN SECTION  1986   CESAREAN SECTION N/A    Phreesia 10/29/2020   CHOLECYSTECTOMY N/A    Phreesia 10/29/2020   COLON SURGERY     COLONOSCOPY WITH PROPOFOL N/A 05/02/2021   Procedure: COLONOSCOPY WITH PROPOFOL;  Surgeon: Dolores Frame, MD;  [facility];  Service: Gastroenterology;  Laterality: N/A;  12:30 PM   CYSTO/  HYDRODISTENTION/  INSTILATION THERAPY  MULTIPLE   ENTEROCUTANEOUS FISTULA CLOSURE  multiple   last one 2015 with small bowel resection   ESOPHAGOGASTRODUODENOSCOPY (EGD) WITH PROPOFOL N/A 05/02/2021   Procedure: ESOPHAGOGASTRODUODENOSCOPY (EGD) WITH PROPOFOL;  Surgeon: Dolores Frame, MD;  [facility];  Service: Gastroenterology;  Laterality: N/A;   ESOPHAGOGASTRODUODENOSCOPY (EGD) WITH PROPOFOL N/A 12/04/2021   Procedure: ESOPHAGOGASTRODUODENOSCOPY (EGD) WITH PROPOFOL;  Surgeon: Lanelle Bal, DO;  [facility];  Service: Endoscopy;  Laterality: N/A;   ESOPHAGOGASTRODUODENOSCOPY (EGD) WITH PROPOFOL N/A 03/31/2022   Procedure: ESOPHAGOGASTRODUODENOSCOPY (EGD) WITH PROPOFOL;  Surgeon: Dolores Frame, MD;  [facility];  Service: Gastroenterology;  Laterality: N/A;  1015   EXPLORATORY LAPAROTOMY INCISIONAL VENTRAL HERNIA REPAIR / RESECTION SMALL BOWEL  11-09-2014   @[facility]    FRACTURE SURGERY N/A    Phreesia 10/29/2020   JOINT REPLACEMENT N/A    Phreesia 10/29/2020   LIPOMA EXCISION Right 09/16/2018   Procedure: EXCISION LIPOMA UPPER BACK;  Surgeon: Berna Bue, MD;  Location: [facility];  Service: General;  Laterality: Right;   MASTECTOMY Left 2006   w/ reconstruction on left (paget's disease)  and right breast augmentation   PERIPHERAL VASCULAR BALLOON ANGIOPLASTY Right 09/13/2023   Procedure: PERIPHERAL VASCULAR BALLOON ANGIOPLASTY;  Surgeon: Maeola Harman, MD;  Location: [facility];  Service: Cardiovascular;  Laterality: Right;  Subclavian and axillary   POLYPECTOMY  05/02/2021   Procedure: POLYPECTOMY;  Surgeon: Dolores Frame, MD;  [facility];  Service: Gastroenterology;;  gastric   POLYPECTOMY  03/31/2022   Procedure: POLYPECTOMY;  Surgeon: Dolores Frame, MD;  [facility];  Service: Gastroenterology;;   Gaspar Bidding DILATION  05/02/2021   Procedure: Gaspar Bidding DILATION;  Surgeon: Dolores Frame, MD;  [facility];  Service: Gastroenterology;;   SMALL INTESTINE SURGERY N/A    Phreesia 10/29/2020   SPINE SURGERY N/A    Phreesia 10/29/2020   TOTAL COLECTOMY  08-04-2002    @[facility]    AND CHOLECYSTECTOMY  (colonic inertia)   TRANSTHORACIC ECHOCARDIOGRAM  02/04/2016   ef 60-65%,  grade 2 diastolic dysfunction/  mild MR   TUBAL LIGATION N/A    Phreesia 03/16/2021   UPPER EXTREMITY VENOGRAPHY N/A 09/13/2023   Procedure: UPPER EXTREMITY VENOGRAPHY;  Surgeon: Maeola Harman, MD;  Location: [facility]AS[facility] CV [facility];  Service: Cardiovascular;  Laterality:  N/A;   VENA CAVA FILTER PLACEMENT  05/08/2005   @[facility]    [facility] (non-retrievable)   WIDE EXCISION PERIRECTAL ABSCESSES  09-22-2005   @ [facility]   Family History  Problem Relation Age of Onset   Congestive Heart Failure Mother    Diabetes Mother    Heart disease Mother    Hypertension Mother    Other Mother        ketoacidosis   Heart disease Father    Hyperlipidemia Father    Hypertension Father    Alcohol abuse Father    Congestive Heart Failure Father    Supraventricular tachycardia Daughter    Colon cancer Maternal Aunt    Breast cancer Maternal Aunt    Breast cancer Maternal Aunt    Congestive Heart Failure Maternal Aunt    Cancer Maternal Aunt    Heart attack Maternal Aunt    Breast cancer Maternal Aunt    Cancer Maternal Aunt    Rheum arthritis Maternal Aunt    Breast cancer Maternal Aunt     Breast cancer Maternal Aunt    Cancer Maternal Uncle        mets   Prostate cancer Maternal Uncle    Bone cancer Maternal Grandfather        mets   Ovarian cancer Cousin 19   Breast cancer Cousin    Social History   Socioeconomic History   Marital status: Married    Spouse name: Not on file   Number of children: Not on file   Years of education: Not on file   Highest education level: 12th grade  Occupational History   Not on file  Tobacco Use   Smoking status: Never   Smokeless tobacco: Never  Vaping Use   Vaping status: Never Used  Substance and Sexual Activity   Alcohol use: No   Drug use: No   Sexual activity: Not Currently    Birth control/protection: Surgical    Comment: hyst  Other Topics Concern   Not on file  Social History Narrative   Not on file   Social Determinants of Health   Financial Resource Strain: Low Risk  (05/25/2023)   Overall Financial Resource Strain (CARDIA)    Difficulty of Paying Living Expenses: Not hard at all  Food Insecurity: No Food Insecurity (05/25/2023)   Hunger Vital Sign    Worried About Running Out of Food in the Last Year: Never true    Ran Out of Food in the Last Year: Never true  Transportation Needs: No Transportation Needs (05/25/2023)   PRAPARE - Administrator, Civil Service (Medical): No    Lack of Transportation (Non-Medical): No  Physical Activity: Sufficiently Active (05/25/2023)   Exercise Vital Sign    Days of Exercise per Week: 3 days    Minutes of Exercise per Session: 60 min  Recent Concern: Physical Activity - Insufficiently Active (04/29/2023)   Exercise Vital Sign    Days of Exercise per Week: 4 days    Minutes of Exercise per Session: 30 min  Stress: No Stress Concern Present (05/25/2023)   [employer] of Occupational Health - Occupational Stress Questionnaire    Feeling of Stress : Only a little  Recent Concern: Stress - Stress Concern Present (04/29/2023)   [employer] of Occupational  Health - Occupational Stress Questionnaire    Feeling of Stress : To some extent  Social Connections: Moderately Isolated (05/25/2023)   Social Connection and Isolation Panel [NHANES]    Frequency of  Communication with Friends and Family: More than three times a week    Frequency of Social Gatherings with Friends and Family: Twice a week    Attends Religious Services: Never    Database administrator or Organizations: No    Attends Banker Meetings: Never    Marital Status: Married  Catering manager Violence: Not At Risk (05/25/2023)   Humiliation, Afraid, Rape, and Kick questionnaire    Fear of Current or Ex-Partner: No    Emotionally Abused: No    Physically Abused: No    Sexually Abused: No    SUBJECTIVE  Review of Systems*** Constitutional: Patient denies any unintentional weight loss or change in strength lntegumentary: Patient denies any rashes or pruritus Eyes: Patient denies ***dry eyes ENT: Patient ***denies dry mouth Cardiovascular: Patient denies chest pain or syncope Respiratory: Patient denies shortness of breath Gastrointestinal: Patient ***denies nausea, vomiting, constipation, or diarrhea Musculoskeletal: Patient denies muscle cramps or weakness Neurologic: Patient denies convulsions or seizures Allergic/Immunologic: Patient denies recent allergic reaction(s) Hematologic/Lymphatic: Patient denies bleeding tendencies Endocrine: Patient denies heat/cold intolerance  GU: As per HPI.  OBJECTIVE There were no vitals filed for this visit. There is no height or weight on file to calculate BMI.  Physical Examination*** Constitutional: No obvious distress; patient is non-toxic appearing  Cardiovascular: No visible lower extremity edema.  Respiratory: The patient does not have audible wheezing/stridor; respirations do not appear labored  Gastrointestinal: Abdomen non-distended Musculoskeletal: Normal ROM of UEs  Skin: No obvious rashes/open sores   Neurologic: CN 2-12 grossly intact Psychiatric: Answered questions appropriately with normal affect  Hematologic/Lymphatic/Immunologic: No obvious bruises or sites of spontaneous bleeding  UA: ***negative *** WBC/hpf, *** RBC/hpf, bacteria (***) PVR: *** ml  ASSESSMENT No diagnosis found.  She is doing ***well.  Will continue current medications; refills ***sent.  Will plan for follow up in *** months / ***1 year or sooner if needed. Pt verbalized understanding and agreement. All questions were answered.  PLAN Advised the following: Continue current medications as prescribed. 2. ***No follow-ups on file.  No orders of the defined types were placed in this encounter.   It has been explained that the patient is to follow regularly with their PCP in addition to all other providers involved in their care and to follow instructions provided by these respective offices. Patient advised to contact urology clinic if any urologic-pertaining questions, concerns, new symptoms or problems arise in the interim period.  There are no Patient Instructions on file for this visit.  Electronically signed by:  Donnita Falls, MSN, FNP-C, CUNP 10/19/2023 5:28 PM

## 2023-10-20 ENCOUNTER — Ambulatory Visit (INDEPENDENT_AMBULATORY_CARE_PROVIDER_SITE_OTHER): Payer: Medicare Other

## 2023-10-20 VITALS — Ht 64.0 in | Wt 195.0 lb

## 2023-10-20 DIAGNOSIS — Z0000 Encounter for general adult medical examination without abnormal findings: Secondary | ICD-10-CM

## 2023-10-20 NOTE — Progress Notes (Signed)
 Because this visit was a virtual/telehealth visit,  certain criteria was not obtained, such a blood pressure, CBG if applicable, and timed get up and go. Any medications not marked as "taking" were not mentioned during the medication reconciliation part of the visit. Any vitals not documented were not able to be obtained due to this being a telehealth visit or patient was unable to self-report a recent blood pressure reading due to a lack of equipment at home via telehealth. Vitals that have been documented are verbally provided by the patient.   Subjective:   [name] is a 62 y.o. female who presents for Medicare Annual (Subsequent) preventive examination.  Visit Complete: Virtual I connected with  [name] on 10/20/23 by a audio enabled telemedicine application and verified that I am speaking with the correct person using two identifiers.  Patient Location: Home  Provider Location: Home Office  I discussed the limitations of evaluation and management by telemedicine. The patient expressed understanding and agreed to proceed.  Vital Signs: Because this visit was a virtual/telehealth visit, some criteria may be missing or patient reported. Any vitals not documented were not able to be obtained and vitals that have been documented are patient reported.  Patient Medicare AWV questionnaire was completed by the patient on na; I have confirmed that all information answered by patient is correct and no changes since this date.  Cardiac Risk Factors include: dyslipidemia;hypertension;obesity (BMI >30kg/m2);sedentary lifestyle;Other (see comment), Risk factor comments: hx of tachycardia     Objective:    Today's Vitals   10/20/23 1104 10/20/23 1105  Weight: 195 lb (88.5 kg)   Height: 5\' 4"  (1.626 m)   PainSc:  7    Body mass index is 33.47 kg/m.     10/20/2023   11:04 AM 09/13/2023   10:03 AM 09/07/2023    1:47 PM 04/22/2023   11:07 PM 04/22/2023    1:48 PM  02/16/2023    3:18 PM 09/11/2022   10:25 AM  Advanced Directives  Does Patient Have a Medical Advance Directive? No Yes No Yes No No No  Type of Advance Directive  Healthcare Power of [employer] Power of Attorney     Does patient want to make changes to medical advance directive?  No - Patient declined  No - Patient declined     Copy of Healthcare Power of Attorney in Chart?  No - copy requested       Would patient like information on creating a medical advance directive? No - Patient declined No - Patient declined No - Patient declined No - Patient declined No - Patient declined No - Patient declined No - Patient declined    Current Medications (verified) Outpatient Encounter Medications as of 10/20/2023  Medication Sig   ALPRAZolam (XANAX) 1 MG tablet Take 1 tablet (1 mg total) by mouth 2 (two) times daily.   aspirin 81 MG chewable tablet Chew 81 mg by mouth daily.   butalbital-acetaminophen-caffeine (FIORICET) 50-325-40 MG tablet Take 1 tablet by mouth every 6 (six) hours as needed for headache.   Cholecalciferol (VITAMIN D3) 1.25 MG (50000 UT) CAPS Take 1 capsule by mouth every 30 (thirty) days.   ciprofloxacin (CIPRO) 500 MG tablet Take 1 tablet (500 mg total) by mouth 2 (two) times daily for 5 days.   conjugated estrogens (PREMARIN) vaginal cream INSERT 1 APPLICATORFUL VAGINALLY DAILY (Patient taking differently: Place 1 applicator vaginally at bedtime.)   cyanocobalamin (VITAMIN B12) 1000 MCG/ML injection Inject 1 mL (  1,000 mcg total) into the muscle every 30 (thirty) days.   cyclobenzaprine (FLEXERIL) 10 MG tablet Take 1 tablet (10 mg total) by mouth 2 (two) times daily as needed for muscle spasms. (Patient taking differently: Take 10 mg by mouth 2 (two) times daily.)   cycloSPORINE (RESTASIS) 0.05 % ophthalmic emulsion Place 1 drop into both eyes 2 (two) times daily as needed (dry eyes).   DULoxetine (CYMBALTA) 60 MG capsule Take 60 mg by mouth daily.   erythromycin (ERY-TAB)  250 MG EC tablet TAKE 1 TABLET BY MOUTH THREE TIMES DAILY BEFORE MEALS (Patient taking differently: Take 250 mg by mouth 3 (three) times daily.)   ezetimibe (ZETIA) 10 MG tablet TAKE 1 TABLET(10 MG) BY MOUTH DAILY   fenofibrate (TRICOR) 48 MG tablet Take 1 tablet (48 mg total) by mouth daily.   furosemide (LASIX) 20 MG tablet Take 1 tablet (20 mg total) by mouth daily. (Patient taking differently: Take 20 mg by mouth 2 (two) times daily.)   HYDROmorphone (DILAUDID) 4 MG tablet Take 4 mg by mouth 3 (three) times daily.   levothyroxine (SYNTHROID) 125 MCG tablet TAKE 1 TABLET(125 MCG) BY MOUTH DAILY BEFORE BREAKFAST   lubiprostone (AMITIZA) 24 MCG capsule TAKE 1 CAPSULE(24 MCG) BY MOUTH TWICE DAILY WITH A MEAL   magnesium oxide (MAG-OX) 400 (240 Mg) MG tablet TAKE 1 TABLET BY MOUTH FOUR TIMES DAILY (Patient taking differently: Take 400 mg by mouth 2 (two) times daily.)   MegaRed Omega-3 Krill Oil 500 MG CAPS Take 500 mg by mouth at bedtime.   Melatonin 10 MG TABS Take 10 mg by mouth at bedtime as needed (Sleep).   metFORMIN (GLUCOPHAGE-XR) 500 MG 24 hr tablet Take 500 mg by mouth 2 (two) times daily with a meal.   metoprolol succinate (TOPROL-XL) 25 MG 24 hr tablet TAKE 1 TABLET(25 MG) BY MOUTH DAILY (Patient taking differently: Take 25 mg by mouth 2 (two) times daily.)   Multiple Vitamins-Minerals (ICAPS AREDS 2 PO) Take 1 capsule by mouth at bedtime.   naloxone (NARCAN) nasal spray 4 mg/0.1 mL Place 1 spray into the nose as needed (opioid reversal).   omeprazole (PRILOSEC) 40 MG capsule Take 1 capsule (40 mg total) by mouth in the morning and at bedtime.   OVER THE COUNTER MEDICATION Take 2 tablets by mouth 2 (two) times daily. Nutra-ful   phenazopyridine (PYRIDIUM) 95 MG tablet Take by mouth 3 (three) times daily as needed. sd   Polyethyl Glycol-Propyl Glycol (SYSTANE ULTRA) 0.4-0.3 % SOLN Place 1 drop into both eyes daily.   polyethylene glycol (MIRALAX / GLYCOLAX) 17 g packet Take 17 g by mouth  daily as needed for mild constipation or moderate constipation.   potassium chloride (KLOR-CON M) 10 MEQ tablet Take 1 tablet (10 mEq total) by mouth 2 (two) times daily.   pravastatin (PRAVACHOL) 20 MG tablet TAKE 1 TABLET(20 MG) BY MOUTH DAILY   promethazine (PHENERGAN) 12.5 MG tablet Take 12.5 mg by mouth every 6 (six) hours as needed for nausea or vomiting.   sennosides-docusate sodium (SENOKOT-S) 8.6-50 MG tablet Take 2 tablets by mouth 3 (three) times daily.   XARELTO 20 MG TABS tablet TAKE 1 TABLET(20 MG) BY MOUTH DAILY   No facility-administered encounter medications on file as of 10/20/2023.    Allergies (verified) Nitrofurantoin, Crestor [rosuvastatin calcium], Bisacodyl, Codeine, Iron sucrose, Monosodium glutamate, Scopolamine hbr, Morphine, and Other   History: Past Medical History:  Diagnosis Date   Abdominal hernia 08/14/2019   Abdominal pain 06/06/2019  Acute bronchitis 05/01/2013   Acute cystitis 06/09/2010   Qualifier: Diagnosis of  By: Hudy LPN, Brandi     Acute renal insufficiency 08/28/2011   Allergy    Phreesia 03/16/2021   Anemia    Phreesia 10/29/2020   Anxiety    Anxiety state 03/16/2008   Qualifier: Diagnosis of  By: Lind Guest     Arthritis    Phreesia 10/29/2020   Back pain with radiation 07/17/2014   Bilateral hand pain 07/27/2016   Blood transfusion without reported diagnosis    Phreesia 10/29/2020   Breast cancer (HCC)    L breast- 2006   Cancer (HCC)    Phreesia 10/29/2020   Carpal tunnel syndrome    Chronic constipation    Chronic pain syndrome    followed by [facility]---  back   Clotting disorder (HCC)    Phreesia 10/29/2020   Depression    Dermatitis 12/15/2011   Difficult intubation    Educated about COVID-19 virus infection 05/14/2019   Fall at home 01/26/2016   Family history of adverse reaction to anesthesia    MOTHER--- PONV   Fatigue 09/17/2012   Fibromyalgia    FIBROMYALGIA 03/16/2008   Qualifier: Diagnosis of   By: Lind Guest     GERD (gastroesophageal reflux disease)    Headache disorder 01/16/2013   Headache syndrome 01/26/2016   Hip pain, right 07/08/2019   History of MRSA infection    lip abscess   History of ovarian cyst 06/2011   s/p  BSO   History of pulmonary embolus (PE) 1997   post EP with ablation pulmonary veouns for SVT/ Atrial Fib.   History of supraventricular tachycardia    s/p  ablation 1996  and 1997  by dr Graciela Husbands   History of TIA (transient ischemic attack) 1997   post op EP ablation PE   Hyperlipidemia    Hyperlipidemia LDL goal <100 03/16/2008   Qualifier: Diagnosis of  By: Lind Guest     Hypothyroidism    followed by pcp   Incisional hernia    Insomnia 12/15/2011   Intermittent palpitations 08/22/2017   Interstitial cystitis    09-13-2018   per pt last flare-up  May 2019 (followed by pcp)   INTERSTITIAL CYSTITIS 02/17/2011   Qualifier: Diagnosis of  By: Lodema Hong MD, Margaret     Iron deficiency anemia    09-13-2018  PER PT STABLE   Irregular heart rate 07/25/2013   Lipoma of back    upper   Low back pain radiating to right leg 07/04/2019   Low ferritin level 06/14/2014   MDD (major depressive disorder), single episode, in full remission (HCC) 10/27/2017   Medically noncompliant 02/28/2015   Multiple missed appointments, both follow-up appointments and lab appointments.    Metabolic syndrome X 02/10/2011   Qualifier: Diagnosis of  By: Lodema Hong MD, Margaret  hBA1c is 5.8 in 02/2013    Migraines    Morbid obesity (HCC) 03/27/2013   Muscle spasm 01/03/2020   Nausea alone 07/17/2014   NECK PAIN, CHRONIC 03/16/2008   Qualifier: Diagnosis of  By: Lind Guest     Normal coronary arteries    a. by CT 12/2018.   Obesity 03/16/2008   Qualifier: Diagnosis of  By: Lind Guest     Oral ulceration 08/23/2014   Presented at 06/14/2014 visit    Other malaise and fatigue 03/16/2008   Centricity Description: FATIGUE, CHRONIC Qualifier: Diagnosis of  By:  Lind Guest   Centricity Description: FATIGUE Qualifier:  Diagnosis of  By: Garnette Czech PA, Dawn     OVARIAN CYST 12/26/2009   Qualifier: Diagnosis of  By: Lodema Hong MD, Margaret     Paget disease of breast, left [facility])    Paget's disease of breast, left (HCC) 03/16/2008   Qualifier: Diagnosis of  By: Lind Guest  Left diagnosed in 2006 F/h breast cancer x 15 family members   Partial small bowel obstruction (HCC) 07/04/2012   PONV (postoperative nausea and vomiting)    SEVERE   Postsurgical menopause 11/13/2011   Presence of IVC filter 06/06/2019   PSVT (paroxysmal supraventricular tachycardia) (HCC)    HX ABLATION 1996 AND 1997   Recurrent oral herpes simplex infection 10/13/2017   ROM (right otitis media) 01/23/2013   S/P insertion of IVC (inferior vena caval) filter 05/08/2005   [place] (non-retrievable)  /  dx 2019  a leg of filter is protruding thru the vena cava in to right L2 vertebral body (09-13-2018  per pt having surgery to remove filter in [place])   S/P radiofrequency ablation operation for arrhythmia 1996   1996  and 1997,   SVT and Atrial Fib   Sinusitis, chronic 01/26/2016   SMALL BOWEL OBSTRUCTION, HX OF 08/07/2008   Annotation: obstruction w/ adhesions led to partial colectomy Qualifier: Diagnosis of  By: Minna Merritts     Stroke [facility])    Phreesia 10/29/2020, TIAs in the past   Swelling of hand 09/17/2012   Syncope    Tachycardia 02/03/2010   Qualifier: Diagnosis of  By: Via LPN, Lynn     Thyroid disease    Phreesia 10/29/2020   TOS (thoracic outlet syndrome)    Ulcer 12/15/2011   Urinary frequency 07/18/2014   Vaginitis and vulvovaginitis 05/10/2013   Vitamin D deficiency 05/05/2015   Wears glasses    Past Surgical History:  Procedure Laterality Date   ABDOMINAL HYSTERECTOMY  1987   ANTERIOR CERVICAL DECOMP/DISCECTOMY FUSION  03-07-2002   dr elsner  @[facility]    C 4 -- 5   APPENDECTOMY  1980   AUGMENTATION MAMMAPLASTY Right 2006   BILATERAL  SALPINGOOPHORECTOMY  07/24/2011   via Explor. Lap. w/ intraoperative perf. bowel repair   BIOPSY  05/02/2021   Procedure: BIOPSY;  Surgeon: Marguerita Merles, Reuel Boom, MD;  [facility];  Service: Gastroenterology;;  small bowel, mid esophagus, distal esophagus, random colon biopsies   BIOPSY  12/04/2021   Procedure: BIOPSY;  Surgeon: Lanelle Bal, DO;  [facility];  Service: Endoscopy;;   BIOPSY  03/31/2022   Procedure: BIOPSY;  Surgeon: Dolores Frame, MD;  [facility];  Service: Gastroenterology;;   BOTOX INJECTION N/A 03/31/2022   Procedure: BOTOX INJECTION;  Surgeon: Dolores Frame, MD;  [facility];  Service: Gastroenterology;  Laterality: N/A;   BREAST BIOPSY Right 2019   benign   BREAST ENHANCEMENT SURGERY Bilateral 1993   BREAST IMPLANT REMOVAL Bilateral    BREAST SURGERY N/A    Phreesia 10/29/2020   CARDIAC CATHETERIZATION  07-06-2003   dr Smitty Cords brodie   normal coronaries and LVF   CARDIAC ELECTROPHYSIOLOGY STUDY AND ABLATION  1996  and 1997   CARDIOVASCULAR STRESS TEST  11-18-2015   dr Graciela Husbands   normal nuclear study w/ no ischemia/  normal LV function and wall motion , ef 84%   CARPAL TUNNEL RELEASE Right ?   CESAREAN SECTION  1986   CESAREAN SECTION N/A    Phreesia 10/29/2020   CHOLECYSTECTOMY N/A    Phreesia  10/29/2020   COLON SURGERY     COLONOSCOPY WITH PROPOFOL N/A 05/02/2021   Procedure: COLONOSCOPY WITH PROPOFOL;  Surgeon: Dolores Frame, MD;  [facility];  Service: Gastroenterology;  Laterality: N/A;  12:30 PM   CYSTO/  HYDRODISTENTION/  INSTILATION THERAPY  MULTIPLE   ENTEROCUTANEOUS FISTULA CLOSURE  multiple   last one 2015 with small bowel resection   ESOPHAGOGASTRODUODENOSCOPY (EGD) WITH PROPOFOL N/A 05/02/2021   Procedure: ESOPHAGOGASTRODUODENOSCOPY (EGD) WITH PROPOFOL;  Surgeon: Dolores Frame, MD;  [facility];  Service: Gastroenterology;  Laterality:  N/A;   ESOPHAGOGASTRODUODENOSCOPY (EGD) WITH PROPOFOL N/A 12/04/2021   Procedure: ESOPHAGOGASTRODUODENOSCOPY (EGD) WITH PROPOFOL;  Surgeon: Lanelle Bal, DO;  [facility];  Service: Endoscopy;  Laterality: N/A;   ESOPHAGOGASTRODUODENOSCOPY (EGD) WITH PROPOFOL N/A 03/31/2022   Procedure: ESOPHAGOGASTRODUODENOSCOPY (EGD) WITH PROPOFOL;  Surgeon: Dolores Frame, MD;  [facility];  Service: Gastroenterology;  Laterality: N/A;  1015   EXPLORATORY LAPAROTOMY INCISIONAL VENTRAL HERNIA REPAIR / RESECTION SMALL BOWEL  11-09-2014   @[facility]    FRACTURE SURGERY N/A    Phreesia 10/29/2020   JOINT REPLACEMENT N/A    Phreesia 10/29/2020   LIPOMA EXCISION Right 09/16/2018   Procedure: EXCISION LIPOMA UPPER BACK;  Surgeon: Berna Bue, MD;  Location: [facility];  Service: General;  Laterality: Right;   MASTECTOMY Left 2006   w/ reconstruction on left (paget's disease)  and right breast augmentation   PERIPHERAL VASCULAR BALLOON ANGIOPLASTY Right 09/13/2023   Procedure: PERIPHERAL VASCULAR BALLOON ANGIOPLASTY;  Surgeon: Maeola Harman, MD;  Location: [facility] INVASIVE CV LAB;  Service: Cardiovascular;  Laterality: Right;  Subclavian and axillary   POLYPECTOMY  05/02/2021   Procedure: POLYPECTOMY;  Surgeon: Dolores Frame, MD;  [facility];  Service: Gastroenterology;;  gastric   POLYPECTOMY  03/31/2022   Procedure: POLYPECTOMY;  Surgeon: Dolores Frame, MD;  [facility];  Service: Gastroenterology;;   Gaspar Bidding DILATION  05/02/2021   Procedure: Gaspar Bidding DILATION;  Surgeon: Dolores Frame, MD;  [facility];  Service: Gastroenterology;;   SMALL INTESTINE SURGERY N/A    Phreesia 10/29/2020   SPINE SURGERY N/A    Phreesia 10/29/2020   TOTAL COLECTOMY  08-04-2002    @[facility]    AND CHOLECYSTECTOMY  (colonic inertia)   TRANSTHORACIC ECHOCARDIOGRAM  02/04/2016   ef 60-65%,  grade 2 diastolic  dysfunction/  mild MR   TUBAL LIGATION N/A    Phreesia 03/16/2021   UPPER EXTREMITY VENOGRAPHY N/A 09/13/2023   Procedure: UPPER EXTREMITY VENOGRAPHY;  Surgeon: Maeola Harman, MD;  [facility] INVAS[facility] CV LAB;  Service: Cardiovascular;  Laterality: N/A;   VENA CAVA FILTER PLACEMENT  05/08/2005   @[facility]    [facility] (non-retrievable)   WIDE EXCISION PERIRECTAL ABSCESSES  09-22-2005   @ [facility]   Family History  Problem Relation Age of Onset   Congestive Heart Failure Mother    Diabetes Mother    Heart disease Mother    Hypertension Mother    Other Mother        ketoacidosis   Heart disease Father    Hyperlipidemia Father    Hypertension Father    Alcohol abuse Father    Congestive Heart Failure Father    Supraventricular tachycardia Daughter    Colon cancer Maternal Aunt    Breast cancer Maternal Aunt    Breast cancer Maternal Aunt    Congestive Heart Failure Maternal Aunt    Cancer Maternal Aunt  Heart attack Maternal Aunt    Breast cancer Maternal Aunt    Cancer Maternal Aunt    Rheum arthritis Maternal Aunt    Breast cancer Maternal Aunt    Breast cancer Maternal Aunt    Cancer Maternal Uncle        mets   Prostate cancer Maternal Uncle    Bone cancer Maternal Grandfather        mets   Ovarian cancer Cousin 19   Breast cancer Cousin    Social History   Socioeconomic History   Marital status: Married    Spouse name: Not on file   Number of children: Not on file   Years of education: Not on file   Highest education level: 12th grade  Occupational History   Not on file  Tobacco Use   Smoking status: Never   Smokeless tobacco: Never  Vaping Use   Vaping status: Never Used  Substance and Sexual Activity   Alcohol use: No   Drug use: No   Sexual activity: Not Currently    Birth control/protection: Surgical    Comment: hyst  Other Topics Concern   Not on file  Social History Narrative   Not on file   Social Determinants of Health   Financial  Resource Strain: Low Risk  (10/20/2023)   Overall Financial Resource Strain (CARDIA)    Difficulty of Paying Living Expenses: Not hard at all  Food Insecurity: No Food Insecurity (10/20/2023)   Hunger Vital Sign    Worried About Running Out of Food in the Last Year: Never true    Ran Out of Food in the Last Year: Never true  Transportation Needs: No Transportation Needs (10/20/2023)   PRAPARE - Administrator, Civil Service (Medical): No    Lack of Transportation (Non-Medical): No  Physical Activity: Sufficiently Active (10/20/2023)   Exercise Vital Sign    Days of Exercise per Week: 7 days    Minutes of Exercise per Session: 30 min  Stress: No Stress Concern Present (10/20/2023)   [employer] of Occupational Health - Occupational Stress Questionnaire    Feeling of Stress : Not at all  Social Connections: Moderately Isolated (10/20/2023)   Social Connection and Isolation Panel [NHANES]    Frequency of Communication with Friends and Family: More than three times a week    Frequency of Social Gatherings with Friends and Family: Twice a week    Attends Religious Services: Never    Database administrator or Organizations: No    Attends Engineer, structural: Never    Marital Status: Married    Tobacco Counseling Counseling given: Yes   Clinical Intake:  Pre-visit preparation completed: Yes  Pain : 0-10 Pain Score: 7  Faces Pain Scale: Hurts whole lot Pain Type: Chronic pain Pain Location: Back Pain Orientation: Lower Pain Descriptors / Indicators: Constant, Aching Pain Onset: More than a month ago Pain Frequency: Constant  Pain Score: 8 Faces Pain Scale: Hurts whole lot Pain Type: Chronic pain Pain Location: Vagina Pain Orientation: Other (Comment) (pain with urination) Pain Descriptors / Indicators: Burning Pain Onset: 1 to 4 weeks ago Pain Frequency: Constant  BMI - recorded: 33.47 Nutritional Status: BMI > 30  Obese Nutritional Risks:  None Diabetes: No  How often do you need to have someone help you when you read instructions, pamphlets, or other written materials from your doctor or pharmacy?: 1 - Never  Interpreter Needed?: No  Information entered by ::  Rhyann Berton,  CMA   Activities of Daily Living    10/20/2023   11:13 AM 09/13/2023   10:02 AM  In your present state of health, do you have any difficulty performing the following activities:  Hearing? 0 0  Vision? 0 0  Difficulty concentrating or making decisions? 0   Walking or climbing stairs? 0 0  Dressing or bathing? 0 0  Doing errands, shopping? 0   Preparing Food and eating ? N   Using the Toilet? N   In the past six months, have you accidently leaked urine? N   Do you have problems with loss of bowel control? N   Managing your Medications? N   Managing your Finances? N   Housekeeping or managing your Housekeeping? N     Patient Care Team: Kerri Perches, MD as PCP - General Christell Constant, MD as PCP - Cardiology (Cardiology) Duke Salvia, MD as PCP - Electrophysiology (Cardiology) Toy Cookey (Inactive) as Referring Physician Ray, Collene Schlichter, MD (Anesthesiology) Donnetta Hail, MD as Consulting Physician (Rheumatology) Ray, Collene Schlichter, MD as Referring Physician (Anesthesiology) Eben Burow, MD as Referring Physician (Cardiothoracic Surgery)  Indicate any recent Medical Services you may have received from other than [facility] providers in the past year (date may be approximate).     Assessment:   This is a routine wellness examination for Ilissa.  Hearing/Vision screen Hearing Screening - Comments:: Patient denies any hearing difficulties.   Vision Screening - Comments:: Wears rx glasses - up to date with routine eye exams  Sees Dr. Charise Killian in [place]   Goals Addressed             This Visit's Progress    Patient Stated       Get my weight back down       Depression Screen    10/20/2023   11:09 AM  05/25/2023    2:22 PM 12/02/2022    2:31 PM 07/24/2022    2:57 PM 07/24/2022    2:54 PM 07/22/2022    4:03 PM 12/15/2021    8:58 AM  PHQ 2/9 Scores  PHQ - 2 Score 0 0 0 0 0 0 0  PHQ- 9 Score 0 4         Fall Risk    10/20/2023   11:12 AM 07/09/2023    2:05 PM 05/25/2023    2:40 PM 12/02/2022    2:31 PM 07/24/2022    2:56 PM  Fall Risk   Falls in the past year? 0 1 1 1 1   Number falls in past yr: 0 0 0 1 0  Injury with Fall? 0 1 1 1  0  Risk for fall due to : No Fall Risks History of fall(s)  No Fall Risks Impaired balance/gait  Follow up Falls prevention discussed Falls evaluation completed  Falls evaluation completed Falls evaluation completed    MEDICARE RISK AT HOME: Medicare Risk at Home Any stairs in or around the home?: No If so, are there any without handrails?: No Home free of loose throw rugs in walkways, pet beds, electrical cords, etc?: Yes Adequate lighting in your home to reduce risk of falls?: Yes Life alert?: No Use of a cane, walker or w/c?: No Grab bars in the bathroom?: Yes Shower chair or bench in shower?: Yes Elevated toilet seat or a handicapped toilet?: Yes  TIMED UP AND GO:  Was the test performed?  No    Cognitive Function:  10/20/2023   11:09 AM 07/24/2022    2:58 PM 10/31/2020    1:41 PM 05/03/2019    1:31 PM  6CIT Screen  What Year? 0 points 0 points 0 points 0 points  What month? 0 points 0 points 0 points 0 points  What time? 0 points 0 points 0 points 0 points  Count back from 20 0 points 0 points 0 points 0 points  Months in reverse 0 points 0 points  0 points  Repeat phrase 0 points 0 points  0 points  Total Score 0 points 0 points  0 points    Immunizations Immunization History  Administered Date(s) Administered   Hep B, Unspecified 06/27/2009, 08/06/2009, 12/26/2009   Hepatitis A 06/27/2009, 12/26/2009   Hepatitis A, Adult 06/27/2009, 12/26/2009   Hepatitis B 06/27/2009, 08/06/2009, 12/26/2009   Hepatitis B, PED/ADOLESCENT  06/27/2009, 08/06/2009, 12/26/2009   Influenza Split 09/15/2012   Influenza Whole 09/13/2007, 10/01/2008, 09/09/2010, 09/23/2011   Influenza, Seasonal, Injecte, Preservative Fre 10/08/2014   Influenza,inj,Quad PF,6+ Mos 10/25/2013, 09/23/2015, 09/18/2016, 10/11/2017, 08/25/2018, 10/17/2019, 10/05/2020, 09/19/2021, 09/24/2022   Influenza-Unspecified 09/15/2012, 09/27/2013   Moderna Covid-19 Vaccine Bivalent Booster 55yrs & up 07/31/2022, 11/25/2022   Moderna Sars-Covid-2 Vaccination 03/18/2020, 04/22/2020, 11/04/2020   Pneumococcal Polysaccharide-23 09/23/2011   Rsv, Bivalent, Protein Subunit Rsvpref,pf Verdis Frederickson) 11/25/2022   Td 10/27/2005   Tdap 12/22/2016   Zoster Recombinant(Shingrix) 05/15/2021, 08/11/2021    TDAP status: Up to date  Flu Vaccine status: Up to date  Pneumococcal vaccine status: Not age appropriate for this patient.   Covid-19 vaccine status: Information provided on how to obtain vaccines.   Qualifies for Shingles Vaccine? Yes   Zostavax completed No   Shingrix Completed?: Yes  Screening Tests Health Maintenance  Topic Date Due   Medicare Annual Wellness (AWV)  07/25/2023   COVID-19 Vaccine (6 - 2023-24 season) 08/29/2023   MAMMOGRAM  07/05/2025   DTaP/Tdap/Td (3 - Td or Tdap) 12/22/2026   Colonoscopy  05/03/2031   INFLUENZA VACCINE  Completed   Hepatitis C Screening  Completed   HIV Screening  Completed   Zoster Vaccines- Shingrix  Completed   HPV VACCINES  Aged Out    Health Maintenance  Health Maintenance Due  Topic Date Due   Medicare Annual Wellness (AWV)  07/25/2023   COVID-19 Vaccine (6 - 2023-24 season) 08/29/2023    Colorectal cancer screening: Type of screening: Colonoscopy. Completed 05/02/2021. Repeat every 10 years  Mammogram status: Completed 07/06/2023. Repeat every year  Bone Density Screening: Not age appropriate for this patient.   Lung Cancer Screening: (Low Dose CT Chest recommended if Age 5-80 years, 20 pack-year currently  smoking OR have quit w/in 15years.) does not qualify.   Lung Cancer Screening Referral: na  Additional Screening:  Hepatitis C Screening: does not qualify; Completed 01/27/2016  Vision Screening: Recommended annual ophthalmology exams for early detection of glaucoma and other disorders of the eye. Is the patient up to date with their annual eye exam?  Yes  Who is the provider or what is the name of the office in which the patient attends annual eye exams?  Dr. Charise Killian If pt is not established with a provider, would they like to be referred to a provider to establish care? No .   Dental Screening: Recommended annual dental exams for proper oral hygiene  Diabetic Foot Exam: na  Community Resource Referral / Chronic Care Management: CRR required this visit?  No   CCM required this visit?  No  Plan:     I have personally reviewed and noted the following in the patient's chart:   Medical and social history Use of alcohol, tobacco or illicit drugs  Current medications and supplements including opioid prescriptions. Patient is currently taking opioid prescriptions. Information provided to patient regarding non-opioid alternatives. Patient advised to discuss non-opioid treatment plan with their provider. Functional ability and status Nutritional status Physical activity Advanced directives List of other physicians Hospitalizations, surgeries, and ER visits in previous 12 months Vitals Screenings to include cognitive, depression, and falls Referrals and appointments  In addition, I have reviewed and discussed with patient certain preventive protocols, quality metrics, and best practice recommendations. A written personalized care plan for preventive services as well as general preventive health recommendations were provided to patient.     Jordan Hawks Italo Banton, CMA   10/20/2023   After Visit Summary: (MyChart) Due to this being a telephonic visit, the after visit summary with  patients personalized plan was offered to patient via MyChart

## 2023-10-20 NOTE — Patient Instructions (Signed)
[name] , Thank you for taking time to come for your Medicare Wellness Visit. I appreciate your ongoing commitment to your health goals. Please review the following plan we discussed and let me know if I can assist you in the future.   Referrals/Orders/Follow-Ups/Clinician Recommendations: Next AWV 10/23/2024 at 10:00 am virtual  This is a list of the screening recommended for you and due dates:  Health Maintenance  Topic Date Due   COVID-19 Vaccine (6 - 2023-24 season) 08/29/2023   Mammogram  07/05/2024   Medicare Annual Wellness Visit  10/19/2024   DTaP/Tdap/Td vaccine (3 - Td or Tdap) 12/22/2026   Colon Cancer Screening  05/03/2031   Flu Shot  Completed   Hepatitis C Screening  Completed   HIV Screening  Completed   Zoster (Shingles) Vaccine  Completed   HPV Vaccine  Aged Out    Advanced directives: (Declined) Advance directive discussed with you today. Even though you declined this today, please call our office should you change your mind, and we can give you the proper paperwork for you to fill out.  Next Medicare Annual Wellness Visit scheduled for next year: Yes  Preventive Care 48-30 Years Old, Female Preventive care refers to lifestyle choices and visits with your health care provider that can promote health and wellness. Preventive care visits are also called wellness exams. What can I expect for my preventive care visit? Counseling Your health care provider may ask you questions about your: Medical history, including: Past medical problems. Family medical history. Pregnancy history. Current health, including: Menstrual cycle. Method of birth control. Emotional well-being. Home life and relationship well-being. Sexual activity and sexual health. Lifestyle, including: Alcohol, nicotine or tobacco, and drug use. Access to firearms. Diet, exercise, and sleep habits. Work and work Astronomer. Sunscreen use. Safety issues such as seatbelt and bike helmet  use. Physical exam Your health care provider will check your: Height and weight. These may be used to calculate your BMI (body mass index). BMI is a measurement that tells if you are at a healthy weight. Waist circumference. This measures the distance around your waistline. This measurement also tells if you are at a healthy weight and may help predict your risk of certain diseases, such as type 2 diabetes and high blood pressure. Heart rate and blood pressure. Body temperature. Skin for abnormal spots. What immunizations do I need?  Vaccines are usually given at various ages, according to a schedule. Your health care provider will recommend vaccines for you based on your age, medical history, and lifestyle or other factors, such as travel or where you work. What tests do I need? Screening Your health care provider may recommend screening tests for certain conditions. This may include: Lipid and cholesterol levels. Diabetes screening. This is done by checking your blood sugar (glucose) after you have not eaten for a while (fasting). Pelvic exam and Pap test. Hepatitis B test. Hepatitis C test. HIV (human immunodeficiency virus) test. STI (sexually transmitted infection) testing, if you are at risk. Lung cancer screening. Colorectal cancer screening. Mammogram. Talk with your health care provider about when you should start having regular mammograms. This may depend on whether you have a family history of breast cancer. BRCA-related cancer screening. This may be done if you have a family history of breast, ovarian, tubal, or peritoneal cancers. Bone density scan. This is done to screen for osteoporosis. Talk with your health care provider about your test results, treatment options, and if necessary, the need for more tests.  Follow these instructions at home: Eating and drinking  Eat a diet that includes fresh fruits and vegetables, whole grains, lean protein, and low-fat dairy  products. Take vitamin and mineral supplements as recommended by your health care provider. Do not drink alcohol if: Your health care provider tells you not to drink. You are pregnant, may be pregnant, or are planning to become pregnant. If you drink alcohol: Limit how much you have to 0-1 drink a day. Know how much alcohol is in your drink. In the [place] one drink equals one 12 oz bottle of beer (355 mL), one 5 oz glass of wine (148 mL), or one 1 oz glass of hard liquor (44 mL). Lifestyle Brush your teeth every morning and night with fluoride toothpaste. Floss one time each day. Exercise for at least 30 minutes 5 or more days each week. Do not use any products that contain nicotine or tobacco. These products include cigarettes, chewing tobacco, and vaping devices, such as e-cigarettes. If you need help quitting, ask your health care provider. Do not use drugs. If you are sexually active, practice safe sex. Use a condom or other form of protection to prevent STIs. If you do not wish to become pregnant, use a form of birth control. If you plan to become pregnant, see your health care provider for a prepregnancy visit. Take aspirin only as told by your health care provider. Make sure that you understand how much to take and what form to take. Work with your health care provider to find out whether it is safe and beneficial for you to take aspirin daily. Find healthy ways to manage stress, such as: Meditation, yoga, or listening to music. Journaling. Talking to a trusted person. Spending time with friends and family. Minimize exposure to UV radiation to reduce your risk of skin cancer. Safety Always wear your seat belt while driving or riding in a vehicle. Do not drive: If you have been drinking alcohol. Do not ride with someone who has been drinking. When you are tired or distracted. While texting. If you have been using any mind-altering substances or drugs. Wear a helmet and other  protective equipment during sports activities. If you have firearms in your house, make sure you follow all gun safety procedures. Seek help if you have been physically or sexually abused. What's next? Visit your health care provider once a year for an annual wellness visit. Ask your health care provider how often you should have your eyes and teeth checked. Stay up to date on all vaccines. This information is not intended to replace advice given to you by your health care provider. Make sure you discuss any questions you have with your health care provider. Document Revised: 06/11/2021 Document Reviewed: 06/11/2021 Elsevier Patient Education  2024 [employer]. Understanding Your Risk for Falls Millions of people have serious injuries from falls each year. It is important to understand your risk of falling. Talk with your health care provider about your risk and what you can do to lower it. If you do have a serious fall, make sure to tell your provider. Falling once raises your risk of falling again. How can falls affect me? Serious injuries from falls are common. These include: Broken bones, such as hip fractures. Head injuries, such as traumatic brain injuries (TBI) or concussions. A fear of falling can cause you to avoid activities and stay at home. This can make your muscles weaker and raise your risk for a fall. What can increase my  risk? There are a number of risk factors that increase your risk for falling. The more risk factors you have, the higher your risk of falling. Serious injuries from a fall happen most often to people who are older than 62 years old. Teenagers and young adults ages 73-29 are also at higher risk. Common risk factors include: Weakness in the lower body. Being generally weak or confused due to long-term (chronic) illness. Dizziness or balance problems. Poor vision. Medicines that cause dizziness or drowsiness. These may include: Medicines for your blood  pressure, heart, anxiety, insomnia, or swelling (edema). Pain medicines. Muscle relaxants. Other risk factors include: Drinking alcohol. Having had a fall in the past. Having foot pain or wearing improper footwear. Working at a dangerous job. Having any of the following in your home: Tripping hazards, such as floor clutter or loose rugs. Poor lighting. Pets. Having dementia or memory loss. What actions can I take to lower my risk of falling?     Physical activity Stay physically fit. Do strength and balance exercises. Consider taking a regular class to build strength and balance. Yoga and tai chi are good options. Vision Have your eyes checked every year and your prescription for glasses or contacts updated as needed. Shoes and walking aids Wear non-skid shoes. Wear shoes that have rubber soles and low heels. Do not wear high heels. Do not walk around the house in socks or slippers. Use a cane or walker as told by your provider. Home safety Attach secure railings on both sides of your stairs. Install grab bars for your bathtub, shower, and toilet. Use a non-skid mat in your bathtub or shower. Attach bath mats securely with double-sided, non-slip rug tape. Use good lighting in all rooms. Keep a flashlight near your bed. Make sure there is a clear path from your bed to the bathroom. Use night-lights. Do not use throw rugs. Make sure all carpeting is taped or tacked down securely. Remove all clutter from walkways and stairways, including extension cords. Repair uneven or broken steps and floors. Avoid walking on icy or slippery surfaces. Walk on the grass instead of on icy or slick sidewalks. Use ice melter to get rid of ice on walkways in the winter. Use a cordless phone. Questions to ask your health care provider Can you help me check my risk for a fall? Do any of my medicines make me more likely to fall? Should I take a vitamin D supplement? What exercises can I do to improve  my strength and balance? Should I make an appointment to have my vision checked? Do I need a bone density test to check for weak bones (osteoporosis)? Would it help to use a cane or a walker? Where to find more information Centers for Disease Control and Prevention, STEADI: TonerPromos.no Community-Based Fall Prevention Programs: TonerPromos.no [employer] on Aging: BaseRingTones.pl Contact a health care provider if: You fall at home. You are afraid of falling at home. You feel weak, drowsy, or dizzy. This information is not intended to replace advice given to you by your health care provider. Make sure you discuss any questions you have with your health care provider. Document Revised: 08/17/2022 Document Reviewed: 08/17/2022 Elsevier Patient Education  2024 Elsevier Inc. Managing Pain Without Opioids Opioids are strong medicines used to treat moderate to severe pain. For some people, especially those who have long-term (chronic) pain, opioids may not be the best choice for pain management due to: Side effects like nausea, constipation, and sleepiness. The risk  of addiction (opioid use disorder). The longer you take opioids, the greater your risk of addiction. Pain that lasts for more than 3 months is called chronic pain. Managing chronic pain usually requires more than one approach and is often provided by a team of health care providers working together (multidisciplinary approach). Pain management may be done at a pain management center or pain clinic. How to manage pain without the use of opioids Use non-opioid medicines Non-opioid medicines for pain may include: Over-the-counter or prescription non-steroidal anti-inflammatory drugs (NSAIDs). These may be the first medicines used for pain. They work well for muscle and bone pain, and they reduce swelling. Acetaminophen. This over-the-counter medicine may work well for milder pain but not swelling. Antidepressants. These may be used to treat  chronic pain. A certain type of antidepressant (tricyclics) is often used. These medicines are given in lower doses for pain than when used for depression. Anticonvulsants. These are usually used to treat seizures but may also reduce nerve (neuropathic) pain. Muscle relaxants. These relieve pain caused by sudden muscle tightening (spasms). You may also use a pain medicine that is applied to the skin as a patch, cream, or gel (topical analgesic), such as a numbing medicine. These may cause fewer side effects than medicines taken by mouth. Do certain therapies as directed Some therapies can help with pain management. They include: Physical therapy. You will do exercises to gain strength and flexibility. A physical therapist may teach you exercises to move and stretch parts of your body that are weak, stiff, or painful. You can learn these exercises at physical therapy visits and practice them at home. Physical therapy may also involve: Massage. Heat wraps or applying heat or cold to affected areas. Electrical signals that interrupt pain signals (transcutaneous electrical nerve stimulation, TENS). Weak lasers that reduce pain and swelling (low-level laser therapy). Signals from your body that help you learn to regulate pain (biofeedback). Occupational therapy. This helps you to learn ways to function at home and work with less pain. Recreational therapy. This involves trying new activities or hobbies, such as a physical activity or drawing. Mental health therapy, including: Cognitive behavioral therapy (CBT). This helps you learn coping skills for dealing with pain. Acceptance and commitment therapy (ACT) to change the way you think and react to pain. Relaxation therapies, including muscle relaxation exercises and mindfulness-based stress reduction. Pain management counseling. This may be individual, family, or group counseling.  Receive medical treatments Medical treatments for pain management  include: Nerve block injections. These may include a pain blocker and anti-inflammatory medicines. You may have injections: Near the spine to relieve chronic back or neck pain. Into joints to relieve back or joint pain. Into nerve areas that supply a painful area to relieve body pain. Into muscles (trigger point injections) to relieve some painful muscle conditions. A medical device placed near your spine to help block pain signals and relieve nerve pain or chronic back pain (spinal cord stimulation device). Acupuncture. Follow these instructions at home Medicines Take over-the-counter and prescription medicines only as told by your health care provider. If you are taking pain medicine, ask your health care providers about possible side effects to watch out for. Do not drive or use heavy machinery while taking prescription opioid pain medicine. Lifestyle  Do not use drugs or alcohol to reduce pain. If you drink alcohol, limit how much you have to: 0-1 drink a day for women who are not pregnant. 0-2 drinks a day for men. Know how much  alcohol is in a drink. In the [place] one drink equals one 12 oz bottle of beer (355 mL), one 5 oz glass of wine (148 mL), or one 1 oz glass of hard liquor (44 mL). Do not use any products that contain nicotine or tobacco. These products include cigarettes, chewing tobacco, and vaping devices, such as e-cigarettes. If you need help quitting, ask your health care provider. Eat a healthy diet and maintain a healthy weight. Poor diet and excess weight may make pain worse. Eat foods that are high in fiber. These include fresh fruits and vegetables, whole grains, and beans. Limit foods that are high in fat and processed sugars, such as fried and sweet foods. Exercise regularly. Exercise lowers stress and may help relieve pain. Ask your health care provider what activities and exercises are safe for you. If your health care provider approves, join an exercise class  that combines movement and stress reduction. Examples include yoga and tai chi. Get enough sleep. Lack of sleep may make pain worse. Lower stress as much as possible. Practice stress reduction techniques as told by your therapist. General instructions Work with all your pain management providers to find the treatments that work best for you. You are an important member of your pain management team. There are many things you can do to reduce pain on your own. Consider joining an online or in-person support group for people who have chronic pain. Keep all follow-up visits. This is important. Where to find more information You can find more information about managing pain without opioids from: American Academy of Pain Medicine: painmed.org Institute for Chronic Pain: instituteforchronicpain.org American Chronic Pain Association: theacpa.org Contact a health care provider if: You have side effects from pain medicine. Your pain gets worse or does not get better with treatments or home therapy. You are struggling with anxiety or depression. Summary Many types of pain can be managed without opioids. Chronic pain may respond better to pain management without opioids. Pain is best managed when you and a team of health care providers work together. Pain management without opioids may include non-opioid medicines, medical treatments, physical therapy, mental health therapy, and lifestyle changes. Tell your health care providers if your pain gets worse or is not being managed well enough. This information is not intended to replace advice given to you by your health care provider. Make sure you discuss any questions you have with your health care provider. Document Revised: 03/26/2021 Document Reviewed: 03/26/2021 Elsevier Patient Education  2024 [employer].

## 2023-10-27 ENCOUNTER — Ambulatory Visit: Payer: Medicare Other | Admitting: Urology

## 2023-11-02 ENCOUNTER — Other Ambulatory Visit: Payer: Self-pay | Admitting: Family Medicine

## 2023-11-02 ENCOUNTER — Encounter: Payer: Self-pay | Admitting: Vascular Surgery

## 2023-11-03 ENCOUNTER — Other Ambulatory Visit: Payer: Self-pay | Admitting: *Deleted

## 2023-11-05 DIAGNOSIS — E782 Mixed hyperlipidemia: Secondary | ICD-10-CM | POA: Diagnosis not present

## 2023-11-05 NOTE — Telephone Encounter (Signed)
Reason for CRM: Patient called to ask recent urinalysis order has been submitted. Patient has had a UTI for the past month and was told to come by for labs. Patient wants to know if she should report to [facility] or come directly to clinic today. Called clinic access line and received no answer. Please assist. [employer] [facility].

## 2023-11-06 LAB — HEPATIC FUNCTION PANEL
ALT: 16 [IU]/L (ref 0–32)
AST: 19 [IU]/L (ref 0–40)
Albumin: 4.2 g/dL (ref 3.9–4.9)
Alkaline Phosphatase: 108 [IU]/L (ref 44–121)
Bilirubin Total: 0.2 mg/dL (ref 0.0–1.2)
Bilirubin, Direct: 0.13 mg/dL (ref 0.00–0.40)
Total Protein: 6.2 g/dL (ref 6.0–8.5)

## 2023-11-06 LAB — LIPID PANEL
Chol/HDL Ratio: 3 {ratio} (ref 0.0–4.4)
Cholesterol, Total: 139 mg/dL (ref 100–199)
HDL: 47 mg/dL (ref >39)
LDL Chol Calc (NIH): 50 mg/dL (ref 0–99)
Triglycerides: 275 mg/dL — ABNORMAL HIGH (ref 0–149)
VLDL Cholesterol Cal: 42 mg/dL — ABNORMAL HIGH (ref 5–40)

## 2023-11-08 DIAGNOSIS — E6609 Other obesity due to excess calories: Secondary | ICD-10-CM | POA: Diagnosis not present

## 2023-11-08 DIAGNOSIS — Z79899 Other long term (current) drug therapy: Secondary | ICD-10-CM | POA: Diagnosis not present

## 2023-11-08 DIAGNOSIS — Z6832 Body mass index (BMI) 32.0-32.9, adult: Secondary | ICD-10-CM | POA: Diagnosis not present

## 2023-11-08 DIAGNOSIS — R030 Elevated blood-pressure reading, without diagnosis of hypertension: Secondary | ICD-10-CM | POA: Diagnosis not present

## 2023-11-08 DIAGNOSIS — R1084 Generalized abdominal pain: Secondary | ICD-10-CM | POA: Diagnosis not present

## 2023-11-09 LAB — URINALYSIS
Glucose, UA: NEGATIVE
Leukocytes,UA: NEGATIVE
Nitrite, UA: NEGATIVE
RBC, UA: NEGATIVE
Specific Gravity, UA: 1.025 (ref 1.005–1.030)
Urobilinogen, Ur: 0.2 mg/dL (ref 0.2–1.0)
pH, UA: 5.5 (ref 5.0–7.5)

## 2023-11-12 ENCOUNTER — Other Ambulatory Visit: Payer: Self-pay | Admitting: Family Medicine

## 2023-11-15 ENCOUNTER — Other Ambulatory Visit: Payer: Self-pay

## 2023-11-15 ENCOUNTER — Encounter: Payer: Self-pay | Admitting: Orthopedic Surgery

## 2023-11-15 ENCOUNTER — Ambulatory Visit: Payer: Medicare Other | Admitting: Orthopedic Surgery

## 2023-11-15 ENCOUNTER — Other Ambulatory Visit (INDEPENDENT_AMBULATORY_CARE_PROVIDER_SITE_OTHER): Payer: Medicare Other

## 2023-11-15 VITALS — BP 110/78 | HR 108 | Ht 64.0 in | Wt 197.0 lb

## 2023-11-15 DIAGNOSIS — G8929 Other chronic pain: Secondary | ICD-10-CM

## 2023-11-15 DIAGNOSIS — M170 Bilateral primary osteoarthritis of knee: Secondary | ICD-10-CM

## 2023-11-15 DIAGNOSIS — G540 Brachial plexus disorders: Secondary | ICD-10-CM | POA: Insufficient documentation

## 2023-11-15 MED ORDER — METHYLPREDNISOLONE ACETATE 40 MG/ML IJ SUSP
40.0000 mg | Freq: Once | INTRAMUSCULAR | Status: AC
Start: 2023-11-15 — End: 2023-11-15
  Administered 2023-11-15: 40 mg via INTRA_ARTICULAR

## 2023-11-15 NOTE — Patient Instructions (Signed)
We will check which brand of Hyaluronic acid injections (GEL injections) (there are several) your insurance covers. We will call you with price and schedule with you if insurance approves and you are okay with the out of pocket costs. If for any reason they will not cover or the out of pocket costs are high, we will discuss with you and let you know other options available. This process normally takes several weeks to hear back from [place] the insurance approval process takes time.

## 2023-11-15 NOTE — Addendum Note (Signed)
Addended byCaffie Damme on: 11/15/2023 03:09 PM   Modules accepted: Orders

## 2023-11-15 NOTE — Progress Notes (Signed)
Office Visit Note   Patient: [name]           Date of Birth: [birth year]           MRN: [id number] Visit Date: 11/15/2023 Requested by: Kerri Perches, MD [address], [address] [place],  [place] [address] PCP: Kerri Perches, MD   Assessment & Plan:   Encounter Diagnoses  Name Primary?   Bilateral chronic knee pain Yes   Primary osteoarthritis of both knees     No orders of the defined types were placed in this encounter.   She is not a good surgical candidate but injections are fine we will do the injections and then the next time try to get the hyaluronic acid injections   Subjective: Chief Complaint  Patient presents with   Knee Pain    Bilateral states she has had pain for years and she is interested in injections/ then gel injections / states she has runners knees,does not currently run for exercise.     HPI: 62 year old female saw me a few years ago had an injection in her knee comes in with bilateral knee pain right and left on with primarily symptoms in the left knee  She had 24 surgeries at [facility] between 2012 and 2016 secondary to complications from an ovarian cyst and bowel resection some of her surgeries were done at [facility]  Review of systems heavy cardiac history on Xarelto uses capsaicin for the knee Dilaudid for some of the pain  BMI 33.8    Visit Diagnoses:  1. Bilateral chronic knee pain   2. Primary osteoarthritis of both knees      Follow-Up Instructions: No follow-ups on file.    Objective: Vital Signs: BP 110/78   Pulse (!) 108   Ht 5\' 4"  (1.626 m)   Wt 197 lb (89.4 kg)   BMI 33.81 kg/m   Physical Exam Vitals and nursing note reviewed.  Constitutional:      Appearance: Normal appearance.  HENT:     Head: Normocephalic and atraumatic.  Eyes:     General: No scleral icterus.       Right eye: No discharge.        Left eye: No discharge.     Extraocular Movements: Extraocular movements intact.      Conjunctiva/sclera: Conjunctivae normal.     Pupils: Pupils are equal, round, and reactive to light.  Cardiovascular:     Rate and Rhythm: Normal rate.     Pulses: Normal pulses.  Skin:    General: Skin is warm and dry.     Capillary Refill: Capillary refill takes less than 2 seconds.  Neurological:     General: No focal deficit present.     Mental Status: She is alert and oriented to person, place, and time.     Gait: Gait normal.  Psychiatric:        Mood and Affect: Mood normal.        Behavior: Behavior normal.        Thought Content: Thought content normal.        Judgment: Judgment normal.      Ortho Exam  Tenderness along the medial joint lines of both knees with range of motion which are proximal is 125 degrees with full extension and no ligamentous instability   Specialty Comments:  No specialty comments available.  Imaging: DG Knee AP/LAT W/Sunrise Right  Result Date: 11/15/2023 See dictated report of the left knee Both knees  show joint space narrowing medially No significant alignment abnormalities No osteophytes Impression joint space narrowing consistent with osteoarthritis both knees   DG Knee AP/LAT W/Sunrise Left  Result Date: 11/15/2023 Imaging of the left knee x 3 for left knee pain Both knees show subtle but present osteo arthritis manifested as joint space narrowing without significant degenerative osteophytes Alignment on the left is still valgus on the right is still valgus Impression osteoarthritis of both knees with joint space narrowing     PMFS History: Patient Active Problem List   Diagnosis Date Noted   TOS (thoracic outlet syndrome) 11/15/2023   Recurrent UTI (urinary tract infection) 10/17/2023   Right wrist pain 07/10/2023   Pain and swelling of upper extremity, right 07/10/2023   Chronic anxiety 07/10/2023   Obesity (BMI 30.0-34.9) 07/10/2023   S/P hysterectomy with oophorectomy 05/25/2023   Vaginal dryness 05/25/2023   Encounter for  support and coordination of transition of care 05/03/2023   Menopausal vaginal dryness 05/03/2023   Vaginal stenosis 05/03/2023   Atrial fibrillation, chronic (HCC) 04/23/2023   Onychomycosis 12/06/2022   Vaginitis 12/06/2022   Partial small bowel obstruction (HCC) 06/03/2022   Light-headedness 05/13/2022   Hypotensive episode 05/13/2022   Dehydration 05/12/2022   Opioid dependence (HCC) 04/02/2022   Myalgia, lower leg 04/01/2022   Generalized dysmotility of intestine    SBO (small bowel obstruction) (HCC) 03/28/2022   Hypokalemia    Hypomagnesemia    Dark stools    Anemia    Abnormal small bowel motility 11/28/2021   Gastroparesis 11/28/2021   Abnormal CT scan, small bowel    Dilation of small bowel anastomosis    Intractable vomiting 11/12/2021   Bilious vomiting with nausea 11/11/2021   Dilation of biliary tract 11/11/2021   Post-resection malabsorption 09/18/2021   Hematuria 07/18/2021   Bloating 04/17/2021   ADHD, predominantly hyperactive type 01/20/2021   Muscle spasm 01/20/2021   Ovarian cancer (HCC) 12/03/2020   Long-term current use of opiate analgesic 10/22/2020   Migraine 10/22/2020   Muscle spasms of both lower extremities 01/03/2020   Hip pain, right 07/08/2019   Low back pain radiating to right leg 07/04/2019   Abdominal pain 06/06/2019   B12 deficiency 10/11/2018   Other specified abnormal findings of blood chemistry 10/11/2018   Malabsorption 09/23/2018   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, left (HCC) 09/23/2018   Embedded foreign body 03/01/2018   Intermittent palpitations 08/22/2017   Bilateral hand pain 07/27/2016   History of DVT (deep vein thrombosis) 03/30/2016   History of pulmonary embolus (PE) 03/30/2016   S/P insertion of IVC (inferior vena caval) filter 03/05/2016   Headache syndrome 01/26/2016   Syncope 01/23/2016   Spinal stenosis of lumbar region with radiculopathy 08/28/2015   Dysuria 06/11/2015   Vitamin D  deficiency 05/05/2015   Uncontrolled pain 04/15/2015   Chronic right-sided low back pain with bilateral sciatica 07/17/2014   Chest pain 06/14/2014   Low ferritin level 06/14/2014   Adjustment disorder with anxiety 08/31/2013   Irregular heart rate 07/25/2013   Seasonal allergies 05/10/2013   Encounter for monitoring opioid maintenance therapy 01/16/2013   Chronic pain syndrome 01/16/2013   Ventral hernia 08/03/2012   Stress incontinence 02/18/2012   Insomnia 12/15/2011   Interstitial cystitis 02/17/2011   Metabolic syndrome X 02/10/2011   CARPAL TUNNEL SYNDROME, RIGHT 06/09/2010   OVARIAN CYST 12/26/2009   Iron deficiency anemia 08/07/2008   Hx SBO 08/07/2008   Paget's disease of breast, left (HCC) 03/16/2008  Hypothyroidism 03/16/2008   Hyperlipidemia 03/16/2008   Anxiety state 03/16/2008   Whole body pain 03/16/2008   Fibromyalgia 03/16/2008   Past Medical History:  Diagnosis Date   Abdominal hernia 08/14/2019   Abdominal pain 06/06/2019   Acute bronchitis 05/01/2013   Acute cystitis 06/09/2010   Qualifier: Diagnosis of  By: Lillia Mountain LPN, Brandi     Acute renal insufficiency 08/28/2011   Allergy    Phreesia 03/16/2021   Anemia    Phreesia 10/29/2020   Anxiety    Anxiety state 03/16/2008   Qualifier: Diagnosis of  By: Lind Guest     Arthritis    Phreesia 10/29/2020   Back pain with radiation 07/17/2014   Bilateral hand pain 07/27/2016   Blood transfusion without reported diagnosis    Phreesia 10/29/2020   Breast cancer (HCC)    L breast- 2006   Cancer (HCC)    Phreesia 10/29/2020   Carpal tunnel syndrome    Chronic constipation    Chronic pain syndrome    followed by [facility]---  back   Clotting disorder (HCC)    Phreesia 10/29/2020   Depression    Dermatitis 12/15/2011   Difficult intubation    Educated about COVID-19 virus infection 05/14/2019   Fall at home 01/26/2016   Family history of adverse reaction to anesthesia    MOTHER--- PONV    Fatigue 09/17/2012   Fibromyalgia    FIBROMYALGIA 03/16/2008   Qualifier: Diagnosis of  By: Lind Guest     GERD (gastroesophageal reflux disease)    Headache disorder 01/16/2013   Headache syndrome 01/26/2016   Hip pain, right 07/08/2019   History of MRSA infection    lip abscess   History of ovarian cyst 06/2011   s/p  BSO   History of pulmonary embolus (PE) 1997   post EP with ablation pulmonary veouns for SVT/ Atrial Fib.   History of supraventricular tachycardia    s/p  ablation 1996  and 1997  by dr Graciela Husbands   History of TIA (transient ischemic attack) 1997   post op EP ablation PE   Hyperlipidemia    Hyperlipidemia LDL goal <100 03/16/2008   Qualifier: Diagnosis of  By: Lind Guest     Hypothyroidism    followed by pcp   Incisional hernia    Insomnia 12/15/2011   Intermittent palpitations 08/22/2017   Interstitial cystitis    09-13-2018   per pt last flare-up  May 2019 (followed by pcp)   INTERSTITIAL CYSTITIS 02/17/2011   Qualifier: Diagnosis of  By: Lodema Hong MD, Margaret     Iron deficiency anemia    09-13-2018  PER PT STABLE   Irregular heart rate 07/25/2013   Lipoma of back    upper   Low back pain radiating to right leg 07/04/2019   Low ferritin level 06/14/2014   MDD (major depressive disorder), single episode, in full remission (HCC) 10/27/2017   Medically noncompliant 02/28/2015   Multiple missed appointments, both follow-up appointments and lab appointments.    Metabolic syndrome X 02/10/2011   Qualifier: Diagnosis of  By: Lodema Hong MD, Margaret  hBA1c is 5.8 in 02/2013    Migraines    Morbid obesity (HCC) 03/27/2013   Muscle spasm 01/03/2020   Nausea alone 07/17/2014   NECK PAIN, CHRONIC 03/16/2008   Qualifier: Diagnosis of  By: Lind Guest     Normal coronary arteries    a. by CT 12/2018.   Obesity 03/16/2008   Qualifier: Diagnosis of  By: Lind Guest  Oral ulceration 08/23/2014   Presented at 06/14/2014 visit    Other malaise and  fatigue 03/16/2008   Centricity Description: FATIGUE, CHRONIC Qualifier: Diagnosis of  By: Lind Guest   Centricity Description: FATIGUE Qualifier: Diagnosis of  By: Garnette Czech PA, Dawn     OVARIAN CYST 12/26/2009   Qualifier: Diagnosis of  By: Lodema Hong MD, Margaret     Paget disease of breast, left [facility])    Paget's disease of breast, left (HCC) 03/16/2008   Qualifier: Diagnosis of  By: Lind Guest  Left diagnosed in 2006 F/h breast cancer x 15 family members   Partial small bowel obstruction (HCC) 07/04/2012   PONV (postoperative nausea and vomiting)    SEVERE   Postsurgical menopause 11/13/2011   Presence of IVC filter 06/06/2019   PSVT (paroxysmal supraventricular tachycardia) (HCC)    HX ABLATION 1996 AND 1997   Recurrent oral herpes simplex infection 10/13/2017   ROM (right otitis media) 01/23/2013   S/P insertion of IVC (inferior vena caval) filter 05/08/2005   [place] (non-retrievable)  /  dx 2019  a leg of filter is protruding thru the vena cava in to right L2 vertebral body (09-13-2018  per pt having surgery to remove filter in [place])   S/P radiofrequency ablation operation for arrhythmia 1996   1996  and 1997,   SVT and Atrial Fib   Sinusitis, chronic 01/26/2016   SMALL BOWEL OBSTRUCTION, HX OF 08/07/2008   Annotation: obstruction w/ adhesions led to partial colectomy Qualifier: Diagnosis of  By: Minna Merritts     Stroke [facility])    Phreesia 10/29/2020, TIAs in the past   Swelling of hand 09/17/2012   Syncope    Tachycardia 02/03/2010   Qualifier: Diagnosis of  By: Via LPN, Larita Fife     Thyroid disease    Phreesia 10/29/2020   TOS (thoracic outlet syndrome)    Ulcer 12/15/2011   Urinary frequency 07/18/2014   Vaginitis and vulvovaginitis 05/10/2013   Vitamin D deficiency 05/05/2015   Wears glasses     Family History  Problem Relation Age of Onset   Congestive Heart Failure Mother    Diabetes Mother    Heart disease Mother    Hypertension Mother    Other Mother         ketoacidosis   Heart disease Father    Hyperlipidemia Father    Hypertension Father    Alcohol abuse Father    Congestive Heart Failure Father    Supraventricular tachycardia Daughter    Colon cancer Maternal Aunt    Breast cancer Maternal Aunt    Breast cancer Maternal Aunt    Congestive Heart Failure Maternal Aunt    Cancer Maternal Aunt    Heart attack Maternal Aunt    Breast cancer Maternal Aunt    Cancer Maternal Aunt    Rheum arthritis Maternal Aunt    Breast cancer Maternal Aunt    Breast cancer Maternal Aunt    Cancer Maternal Uncle        mets   Prostate cancer Maternal Uncle    Bone cancer Maternal Grandfather        mets   Ovarian cancer Cousin 19   Breast cancer Cousin     Past Surgical History:  Procedure Laterality Date   ABDOMINAL HYSTERECTOMY  1987   ANTERIOR CERVICAL DECOMP/DISCECTOMY FUSION  03-07-2002   dr elsner  @[facility]    C 4 -- 5   APPENDECTOMY  1980   AUGMENTATION MAMMAPLASTY Right 2006   BILATERAL SALPINGOOPHORECTOMY  07/24/2011   via Explor. Lap. w/ intraoperative perf. bowel repair   BIOPSY  05/02/2021   Procedure: BIOPSY;  Surgeon: Marguerita Merles, Reuel Boom, MD;  [facility];  Service: Gastroenterology;;  small bowel, mid esophagus, distal esophagus, random colon biopsies   BIOPSY  12/04/2021   Procedure: BIOPSY;  Surgeon: Lanelle Bal, DO;  [facility];  Service: Endoscopy;;   BIOPSY  03/31/2022   Procedure: BIOPSY;  Surgeon: Dolores Frame, MD;  [facility];  Service: Gastroenterology;;   BOTOX INJECTION N/A 03/31/2022   Procedure: BOTOX INJECTION;  Surgeon: Dolores Frame, MD;  [facility];  Service: Gastroenterology;  Laterality: N/A;   BREAST BIOPSY Right 2019   benign   BREAST ENHANCEMENT SURGERY Bilateral 1993   BREAST IMPLANT REMOVAL Bilateral    BREAST SURGERY N/A    Phreesia 10/29/2020   CARDIAC CATHETERIZATION  07-06-2003   dr Smitty Cords brodie   normal  coronaries and LVF   CARDIAC ELECTROPHYSIOLOGY STUDY AND ABLATION  1996  and 1997   CARDIOVASCULAR STRESS TEST  11-18-2015   dr Graciela Husbands   normal nuclear study w/ no ischemia/  normal LV function and wall motion , ef 84%   CARPAL TUNNEL RELEASE Right ?   CESAREAN SECTION  1986   CESAREAN SECTION N/A    Phreesia 10/29/2020   CHOLECYSTECTOMY N/A    Phreesia 10/29/2020   COLON SURGERY     COLONOSCOPY WITH PROPOFOL N/A 05/02/2021   Procedure: COLONOSCOPY WITH PROPOFOL;  Surgeon: Dolores Frame, MD;  [facility];  Service: Gastroenterology;  Laterality: N/A;  12:30 PM   CYSTO/  HYDRODISTENTION/  INSTILATION THERAPY  MULTIPLE   ENTEROCUTANEOUS FISTULA CLOSURE  multiple   last one 2015 with small bowel resection   ESOPHAGOGASTRODUODENOSCOPY (EGD) WITH PROPOFOL N/A 05/02/2021   Procedure: ESOPHAGOGASTRODUODENOSCOPY (EGD) WITH PROPOFOL;  Surgeon: Dolores Frame, MD;  [facility];  Service: Gastroenterology;  Laterality: N/A;   ESOPHAGOGASTRODUODENOSCOPY (EGD) WITH PROPOFOL N/A 12/04/2021   Procedure: ESOPHAGOGASTRODUODENOSCOPY (EGD) WITH PROPOFOL;  Surgeon: Lanelle Bal, DO;  [facility];  Service: Endoscopy;  Laterality: N/A;   ESOPHAGOGASTRODUODENOSCOPY (EGD) WITH PROPOFOL N/A 03/31/2022   Procedure: ESOPHAGOGASTRODUODENOSCOPY (EGD) WITH PROPOFOL;  Surgeon: Dolores Frame, MD;  [facility];  Service: Gastroenterology;  Laterality: N/A;  1015   EXPLORATORY LAPAROTOMY INCISIONAL VENTRAL HERNIA REPAIR / RESECTION SMALL BOWEL  11-09-2014   @[facility]    FRACTURE SURGERY N/A    Phreesia 10/29/2020   JOINT REPLACEMENT N/A    Phreesia 10/29/2020   LIPOMA EXCISION Right 09/16/2018   Procedure: EXCISION LIPOMA UPPER BACK;  Surgeon: Berna Bue, MD;  Location: [facility];  Service: General;  Laterality: Right;   MASTECTOMY Left 2006   w/ reconstruction on left (paget's disease)  and right breast augmentation    PERIPHERAL VASCULAR BALLOON ANGIOPLASTY Right 09/13/2023   Procedure: PERIPHERAL VASCULAR BALLOON ANGIOPLASTY;  Surgeon: Maeola Harman, MD;  Location: [facility] INVASIVE CV LAB;  Service: Cardiovascular;  Laterality: Right;  Subclavian and axillary   POLYPECTOMY  05/02/2021   Procedure: POLYPECTOMY;  Surgeon: Dolores Frame, MD;  [facility];  Service: Gastroenterology;;  gastric   POLYPECTOMY  03/31/2022   Procedure: POLYPECTOMY;  Surgeon: Dolores Frame, MD;  [facility];  Service: Gastroenterology;;   Gaspar Bidding DILATION  05/02/2021   Procedure: Gaspar Bidding DILATION;  Surgeon: Marguerita Merles, Reuel Boom, MD;  [facility];  Service: Gastroenterology;;   SMALL INTESTINE SURGERY N/A    Phreesia 10/29/2020   SPINE SURGERY N/A    Phreesia 10/29/2020   TOTAL COLECTOMY  08-04-2002    @[facility]    AND CHOLECYSTECTOMY  (colonic inertia)   TRANSTHORACIC ECHOCARDIOGRAM  02/04/2016   ef 60-65%,  grade 2 diastolic dysfunction/  mild MR   TUBAL LIGATION N/A    Phreesia 03/16/2021   UPPER EXTREMITY VENOGRAPHY N/A 09/13/2023   Procedure: UPPER EXTREMITY VENOGRAPHY;  Surgeon: Maeola Harman, MD;  Location: [facility]VASIVE CV LAB;  Service: Cardiovascular;  Laterality: N/A;   VENA CAVA FILTER PLACEMENT  05/08/2005   @[facility]    [place] (non-retrievable)   WIDE EXCISION PERIRECTAL ABSCESSES  09-22-2005   @ [facility]   Social History   Occupational History   Not on file  Tobacco Use   Smoking status: Never   Smokeless tobacco: Never  Vaping Use   Vaping status: Never Used  Substance and Sexual Activity   Alcohol use: No   Drug use: No   Sexual activity: Not Currently    Birth control/protection: Surgical    Comment: hyst

## 2023-11-18 ENCOUNTER — Ambulatory Visit: Payer: Medicare Other | Admitting: Urology

## 2023-11-18 ENCOUNTER — Encounter: Payer: Self-pay | Admitting: Urology

## 2023-11-18 VITALS — BP 96/69 | HR 106 | Ht 64.0 in | Wt 197.0 lb

## 2023-11-18 DIAGNOSIS — Z87440 Personal history of urinary (tract) infections: Secondary | ICD-10-CM | POA: Diagnosis not present

## 2023-11-18 DIAGNOSIS — R102 Pelvic and perineal pain unspecified side: Secondary | ICD-10-CM

## 2023-11-18 DIAGNOSIS — N3010 Interstitial cystitis (chronic) without hematuria: Secondary | ICD-10-CM

## 2023-11-18 DIAGNOSIS — N3582 Other urethral stricture, female: Secondary | ICD-10-CM

## 2023-11-18 DIAGNOSIS — Z87448 Personal history of other diseases of urinary system: Secondary | ICD-10-CM | POA: Diagnosis not present

## 2023-11-18 DIAGNOSIS — N952 Postmenopausal atrophic vaginitis: Secondary | ICD-10-CM

## 2023-11-18 LAB — URINALYSIS, ROUTINE W REFLEX MICROSCOPIC
Bilirubin, UA: NEGATIVE
Glucose, UA: NEGATIVE
Leukocytes,UA: NEGATIVE
Nitrite, UA: NEGATIVE
RBC, UA: NEGATIVE
Specific Gravity, UA: 1.025 (ref 1.005–1.030)
Urobilinogen, Ur: 1 mg/dL (ref 0.2–1.0)
pH, UA: 6 (ref 5.0–7.5)

## 2023-11-18 LAB — BLADDER SCAN AMB NON-IMAGING: Scan Result: 0

## 2023-11-18 MED ORDER — DIAZEPAM 10 MG PO TABS: ORAL_TABLET | ORAL | 0 refills | Status: DC

## 2023-11-18 NOTE — Progress Notes (Deleted)
NEUROLOGY CONSULTATION NOTE  [name] MRN: [id number] DOB: 1961-04-12  Referring provider: Syliva Overman, MD Primary care provider: Syliva Overman, MD  Reason for consult:  migraines  Assessment/Plan:   ***   Subjective:  [name] is a 62 year old ***-handed female with chronic back pain, arthritis, fibromyalgia, hypothyroidism, clotting disorder with history of DVT and history of breast cancer, SVT s/p ablation who presents for migraines.  History supplemented by referring provider's note.  Onset:  *** Location:  *** Quality:  *** Intensity:  ***.  *** denies new headache, thunderclap headache or severe headache that wakes *** from sleep. Aura:  *** Prodrome:  *** Postdrome:  *** Associated symptoms:  ***.  *** denies associated unilateral numbness or weakness. Duration:  *** Frequency:  *** Frequency of abortive medication: *** Triggers:  *** Relieving factors:  *** Activity:  ***  Past NSAIDS/analgesics:  ibuprofen, naproxen Past abortive triptans:  *** Past abortive ergotamine:  *** Past muscle relaxants:  none Past anti-emetic:  ondansetron Past antihypertensive medications:  metoprolol, verapamil Past antidepressant medications:  *** Past anticonvulsant medications:  topiramate Past anti-CGRP:  *** Past vitamins/Herbal/Supplements:  magnesium oxide Past antihistamines/decongestants:  *** Other past therapies:  ***  Current NSAIDS/analgesics:  Fioricet, hydromorphone (chronic pain), ASA 81mg  daily Current triptans:  none Current ergotamine:  none Current anti-emetic:  promethazine 12.5mg  Current muscle relaxants:  cyclobenzaprine 10mg  BID Current Antihypertensive medications:  furosemide Current Antidepressant medications:  duloxetine 60mg  daily Current Anticonvulsant medications:  none Current anti-CGRP:  none Other therapy:  *** Other medications:  Xarelto, nalaxone, levothyroxine, erythromycin, alprazolam   Caffeine:   *** Alcohol:  *** Smoker:  *** Diet:  *** Exercise:  *** Depression:  ***; Anxiety:  *** Other pain:  *** Sleep hygiene:  *** Family history of headache:  ***  CT head on 04/13/2016 personally reviewed was normal.        PAST MEDICAL HISTORY: Past Medical History:  Diagnosis Date   Abdominal hernia 08/14/2019   Abdominal pain 06/06/2019   Acute bronchitis 05/01/2013   Acute cystitis 06/09/2010   Qualifier: Diagnosis of  By: Lillia Mountain LPN, Brandi     Acute renal insufficiency 08/28/2011   Allergy    Phreesia 03/16/2021   Anemia    Phreesia 10/29/2020   Anxiety    Anxiety state 03/16/2008   Qualifier: Diagnosis of  By: Lind Guest     Arthritis    Phreesia 10/29/2020   Back pain with radiation 07/17/2014   Bilateral hand pain 07/27/2016   Blood transfusion without reported diagnosis    Phreesia 10/29/2020   Breast cancer (HCC)    L breast- 2006   Cancer (HCC)    Phreesia 10/29/2020   Carpal tunnel syndrome    Chronic constipation    Chronic pain syndrome    followed by [facility]---  back   Clotting disorder (HCC)    Phreesia 10/29/2020   Depression    Dermatitis 12/15/2011   Difficult intubation    Educated about COVID-19 virus infection 05/14/2019   Fall at home 01/26/2016   Family history of adverse reaction to anesthesia    MOTHER--- PONV   Fatigue 09/17/2012   Fibromyalgia    FIBROMYALGIA 03/16/2008   Qualifier: Diagnosis of  By: Lind Guest     GERD (gastroesophageal reflux disease)    Headache disorder 01/16/2013   Headache syndrome 01/26/2016   Hip pain, right 07/08/2019   History of MRSA infection    lip abscess  History of ovarian cyst 06/2011   s/p  BSO   History of pulmonary embolus (PE) 1997   post EP with ablation pulmonary veouns for SVT/ Atrial Fib.   History of supraventricular tachycardia    s/p  ablation 1996  and 1997  by dr Graciela Husbands   History of TIA (transient ischemic attack) 1997   post op EP ablation PE   Hyperlipidemia     Hyperlipidemia LDL goal <100 03/16/2008   Qualifier: Diagnosis of  By: Lind Guest     Hypothyroidism    followed by pcp   Incisional hernia    Insomnia 12/15/2011   Intermittent palpitations 08/22/2017   Interstitial cystitis    09-13-2018   per pt last flare-up  May 2019 (followed by pcp)   INTERSTITIAL CYSTITIS 02/17/2011   Qualifier: Diagnosis of  By: Lodema Hong MD, Margaret     Iron deficiency anemia    09-13-2018  PER PT STABLE   Irregular heart rate 07/25/2013   Lipoma of back    upper   Low back pain radiating to right leg 07/04/2019   Low ferritin level 06/14/2014   MDD (major depressive disorder), single episode, in full remission (HCC) 10/27/2017   Medically noncompliant 02/28/2015   Multiple missed appointments, both follow-up appointments and lab appointments.    Metabolic syndrome X 02/10/2011   Qualifier: Diagnosis of  By: Lodema Hong MD, Margaret  hBA1c is 5.8 in 02/2013    Migraines    Morbid obesity (HCC) 03/27/2013   Muscle spasm 01/03/2020   Nausea alone 07/17/2014   NECK PAIN, CHRONIC 03/16/2008   Qualifier: Diagnosis of  By: Lind Guest     Normal coronary arteries    a. by CT 12/2018.   Obesity 03/16/2008   Qualifier: Diagnosis of  By: Lind Guest     Oral ulceration 08/23/2014   Presented at 06/14/2014 visit    Other malaise and fatigue 03/16/2008   Centricity Description: FATIGUE, CHRONIC Qualifier: Diagnosis of  By: Lind Guest   Centricity Description: FATIGUE Qualifier: Diagnosis of  By: Garnette Czech PA, Dawn     OVARIAN CYST 12/26/2009   Qualifier: Diagnosis of  By: Lodema Hong MD, Margaret     Paget disease of breast, left [facility])    Paget's disease of breast, left (HCC) 03/16/2008   Qualifier: Diagnosis of  By: Lind Guest  Left diagnosed in 2006 F/h breast cancer x 15 family members   Partial small bowel obstruction (HCC) 07/04/2012   PONV (postoperative nausea and vomiting)    SEVERE   Postsurgical menopause 11/13/2011   Presence of IVC  filter 06/06/2019   PSVT (paroxysmal supraventricular tachycardia) (HCC)    HX ABLATION 1996 AND 1997   Recurrent oral herpes simplex infection 10/13/2017   ROM (right otitis media) 01/23/2013   S/P insertion of IVC (inferior vena caval) filter 05/08/2005   [place] (non-retrievable)  /  dx 2019  a leg of filter is protruding thru the vena cava in to right L2 vertebral body (09-13-2018  per pt having surgery to remove filter in [place])   S/P radiofrequency ablation operation for arrhythmia 1996   1996  and 1997,   SVT and Atrial Fib   Sinusitis, chronic 01/26/2016   SMALL BOWEL OBSTRUCTION, HX OF 08/07/2008   Annotation: obstruction w/ adhesions led to partial colectomy Qualifier: Diagnosis of  By: Minna Merritts     Stroke [facility])    Phreesia 10/29/2020, TIAs in the past   Swelling of hand 09/17/2012   Syncope  Tachycardia 02/03/2010   Qualifier: Diagnosis of  By: Via LPN, Lynn     Thyroid disease    Phreesia 10/29/2020   TOS (thoracic outlet syndrome)    Ulcer 12/15/2011   Urinary frequency 07/18/2014   Vaginitis and vulvovaginitis 05/10/2013   Vitamin D deficiency 05/05/2015   Wears glasses     PAST SURGICAL HISTORY: Past Surgical History:  Procedure Laterality Date   ABDOMINAL HYSTERECTOMY  1987   ANTERIOR CERVICAL DECOMP/DISCECTOMY FUSION  03-07-2002   dr elsner  @[facility]    C 4 -- 5   APPENDECTOMY  1980   AUGMENTATION MAMMAPLASTY Right 2006   BILATERAL SALPINGOOPHORECTOMY  07/24/2011   via Explor. Lap. w/ intraoperative perf. bowel repair   BIOPSY  05/02/2021   Procedure: BIOPSY;  Surgeon: Marguerita Merles, Reuel Boom, MD;  [facility];  Service: Gastroenterology;;  small bowel, mid esophagus, distal esophagus, random colon biopsies   BIOPSY  12/04/2021   Procedure: BIOPSY;  Surgeon: Lanelle Bal, DO;  [facility];  Service: Endoscopy;;   BIOPSY  03/31/2022   Procedure: BIOPSY;  Surgeon: Dolores Frame, MD;  [facility];   Service: Gastroenterology;;   BOTOX INJECTION N/A 03/31/2022   Procedure: BOTOX INJECTION;  Surgeon: Dolores Frame, MD;  [facility];  Service: Gastroenterology;  Laterality: N/A;   BREAST BIOPSY Right 2019   benign   BREAST ENHANCEMENT SURGERY Bilateral 1993   BREAST IMPLANT REMOVAL Bilateral    BREAST SURGERY N/A    Phreesia 10/29/2020   CARDIAC CATHETERIZATION  07-06-2003   dr Smitty Cords brodie   normal coronaries and LVF   CARDIAC ELECTROPHYSIOLOGY STUDY AND ABLATION  1996  and 1997   CARDIOVASCULAR STRESS TEST  11-18-2015   dr Graciela Husbands   normal nuclear study w/ no ischemia/  normal LV function and wall motion , ef 84%   CARPAL TUNNEL RELEASE Right ?   CESAREAN SECTION  1986   CESAREAN SECTION N/A    Phreesia 10/29/2020   CHOLECYSTECTOMY N/A    Phreesia 10/29/2020   COLON SURGERY     COLONOSCOPY WITH PROPOFOL N/A 05/02/2021   Procedure: COLONOSCOPY WITH PROPOFOL;  Surgeon: Dolores Frame, MD;  [facility];  Service: Gastroenterology;  Laterality: N/A;  12:30 PM   CYSTO/  HYDRODISTENTION/  INSTILATION THERAPY  MULTIPLE   ENTEROCUTANEOUS FISTULA CLOSURE  multiple   last one 2015 with small bowel resection   ESOPHAGOGASTRODUODENOSCOPY (EGD) WITH PROPOFOL N/A 05/02/2021   Procedure: ESOPHAGOGASTRODUODENOSCOPY (EGD) WITH PROPOFOL;  Surgeon: Dolores Frame, MD;  [facility];  Service: Gastroenterology;  Laterality: N/A;   ESOPHAGOGASTRODUODENOSCOPY (EGD) WITH PROPOFOL N/A 12/04/2021   Procedure: ESOPHAGOGASTRODUODENOSCOPY (EGD) WITH PROPOFOL;  Surgeon: Lanelle Bal, DO;  [facility];  Service: Endoscopy;  Laterality: N/A;   ESOPHAGOGASTRODUODENOSCOPY (EGD) WITH PROPOFOL N/A 03/31/2022   Procedure: ESOPHAGOGASTRODUODENOSCOPY (EGD) WITH PROPOFOL;  Surgeon: Dolores Frame, MD;  [facility];  Service: Gastroenterology;  Laterality: N/A;  1015   EXPLORATORY LAPAROTOMY INCISIONAL VENTRAL HERNIA REPAIR  / RESECTION SMALL BOWEL  11-09-2014   @[facility]    FRACTURE SURGERY N/A    Phreesia 10/29/2020   JOINT REPLACEMENT N/A    Phreesia 10/29/2020   LIPOMA EXCISION Right 09/16/2018   Procedure: EXCISION LIPOMA UPPER BACK;  Surgeon: Berna Bue, MD;  Location: [facility];  Service: General;  Laterality: Right;   MASTECTOMY Left 2006   w/ reconstruction on left (paget's disease)  and  right breast augmentation   PERIPHERAL VASCULAR BALLOON ANGIOPLASTY Right 09/13/2023   Procedure: PERIPHERAL VASCULAR BALLOON ANGIOPLASTY;  Surgeon: Maeola Harman, MD;  Location: [facility] INVASIVE CV LAB;  Service: Cardiovascular;  Laterality: Right;  Subclavian and axillary   POLYPECTOMY  05/02/2021   Procedure: POLYPECTOMY;  Surgeon: Dolores Frame, MD;  [facility];  Service: Gastroenterology;;  gastric   POLYPECTOMY  03/31/2022   Procedure: POLYPECTOMY;  Surgeon: Dolores Frame, MD;  [facility];  Service: Gastroenterology;;   Gaspar Bidding DILATION  05/02/2021   Procedure: Gaspar Bidding DILATION;  Surgeon: Dolores Frame, MD;  [facility];  Service: Gastroenterology;;   SMALL INTESTINE SURGERY N/A    Phreesia 10/29/2020   SPINE SURGERY N/A    Phreesia 10/29/2020   TOTAL COLECTOMY  08-04-2002    @[facility]    AND CHOLECYSTECTOMY  (colonic inertia)   TRANSTHORACIC ECHOCARDIOGRAM  02/04/2016   ef 60-65%,  grade 2 diastolic dysfunction/  mild MR   TUBAL LIGATION N/A    Phreesia 03/16/2021   UPPER EXTREMITY VENOGRAPHY N/A 09/13/2023   Procedure: UPPER EXTREMITY VENOGRAPHY;  Surgeon: Maeola Harman, MD;  Location: [facility] INVASIVE CV LAB;  Service: Cardiovascular;  Laterality: N/A;   VENA CAVA FILTER PLACEMENT  05/08/2005   @[facility]    [facility] (non-retrievable)   WIDE EXCISION PERIRECTAL ABSCESSES  09-22-2005   @ [facility]    MEDICATIONS: Current Outpatient Medications on File Prior to Visit  Medication Sig Dispense Refill   ALPRAZolam (XANAX) 1  MG tablet Take 1 tablet (1 mg total) by mouth 2 (two) times daily. 60 tablet 5   aspirin 81 MG chewable tablet Chew 81 mg by mouth daily.     butalbital-acetaminophen-caffeine (FIORICET) 50-325-40 MG tablet Take 1 tablet by mouth every 6 (six) hours as needed for headache.     Cholecalciferol (VITAMIN D3) 1.25 MG (50000 UT) CAPS Take 1 capsule by mouth every 30 (thirty) days.     conjugated estrogens (PREMARIN) vaginal cream INSERT 1 APPLICATORFUL VAGINALLY DAILY (Patient taking differently: Place 1 applicator vaginally at bedtime.) 30 g 2   cyanocobalamin (VITAMIN B12) 1000 MCG/ML injection Inject 1 mL (1,000 mcg total) into the muscle every 30 (thirty) days. 1 mL 11   cyclobenzaprine (FLEXERIL) 10 MG tablet Take 1 tablet (10 mg total) by mouth 2 (two) times daily as needed for muscle spasms. (Patient taking differently: Take 10 mg by mouth 2 (two) times daily.) 180 tablet 0   cycloSPORINE (RESTASIS) 0.05 % ophthalmic emulsion Place 1 drop into both eyes 2 (two) times daily as needed (dry eyes).     DULoxetine (CYMBALTA) 60 MG capsule Take 60 mg by mouth daily.     erythromycin (ERY-TAB) 250 MG EC tablet TAKE 1 TABLET BY MOUTH THREE TIMES DAILY BEFORE MEALS (Patient taking differently: Take 250 mg by mouth 3 (three) times daily.) 270 tablet 2   ezetimibe (ZETIA) 10 MG tablet TAKE 1 TABLET(10 MG) BY MOUTH DAILY 90 tablet 3   fenofibrate (TRICOR) 48 MG tablet Take 1 tablet (48 mg total) by mouth daily. 30 tablet 5   furosemide (LASIX) 20 MG tablet TAKE 1 TABLET(20 MG) BY MOUTH DAILY 30 tablet 3   HYDROmorphone (DILAUDID) 4 MG tablet Take 4 mg by mouth 3 (three) times daily.     levothyroxine (SYNTHROID) 125 MCG tablet TAKE 1 TABLET(125 MCG) BY MOUTH DAILY BEFORE BREAKFAST 90 tablet 1   lubiprostone (AMITIZA) 24 MCG capsule TAKE 1 CAPSULE(24 MCG) BY MOUTH TWICE DAILY WITH  A MEAL 60 capsule 5   magnesium oxide (MAG-OX) 400 (240 Mg) MG tablet TAKE 1 TABLET BY MOUTH FOUR TIMES DAILY (Patient taking  differently: Take 400 mg by mouth 2 (two) times daily.) 180 tablet 0   MegaRed Omega-3 Krill Oil 500 MG CAPS Take 500 mg by mouth at bedtime.     Melatonin 10 MG TABS Take 10 mg by mouth at bedtime as needed (Sleep).     metFORMIN (GLUCOPHAGE-XR) 500 MG 24 hr tablet Take 500 mg by mouth 2 (two) times daily with a meal.     metoprolol succinate (TOPROL-XL) 25 MG 24 hr tablet TAKE 1 TABLET(25 MG) BY MOUTH DAILY (Patient taking differently: Take 25 mg by mouth 2 (two) times daily.) 90 tablet 2   Multiple Vitamins-Minerals (ICAPS AREDS 2 PO) Take 1 capsule by mouth at bedtime.     naloxone (NARCAN) nasal spray 4 mg/0.1 mL Place 1 spray into the nose as needed (opioid reversal).     omeprazole (PRILOSEC) 40 MG capsule Take 1 capsule (40 mg total) by mouth in the morning and at bedtime. 180 capsule 3   OVER THE COUNTER MEDICATION Take 2 tablets by mouth 2 (two) times daily. Nutra-ful     phenazopyridine (PYRIDIUM) 95 MG tablet Take by mouth 3 (three) times daily as needed. sd     Polyethyl Glycol-Propyl Glycol (SYSTANE ULTRA) 0.4-0.3 % SOLN Place 1 drop into both eyes daily.     polyethylene glycol (MIRALAX / GLYCOLAX) 17 g packet Take 17 g by mouth daily as needed for mild constipation or moderate constipation.     potassium chloride (KLOR-CON M) 10 MEQ tablet Take 1 tablet (10 mEq total) by mouth 2 (two) times daily. 180 tablet 3   pravastatin (PRAVACHOL) 20 MG tablet TAKE 1 TABLET(20 MG) BY MOUTH DAILY 90 tablet 1   promethazine (PHENERGAN) 12.5 MG tablet Take 12.5 mg by mouth every 6 (six) hours as needed for nausea or vomiting.     sennosides-docusate sodium (SENOKOT-S) 8.6-50 MG tablet Take 2 tablets by mouth 3 (three) times daily.     XARELTO 20 MG TABS tablet TAKE 1 TABLET(20 MG) BY MOUTH DAILY 30 tablet 1   No current facility-administered medications on file prior to visit.    ALLERGIES: Allergies  Allergen Reactions   Nitrofurantoin Hives   Crestor [Rosuvastatin Calcium] Other (See  Comments)    Generalized cramps   Bisacodyl Other (See Comments)    Makes patient feel like she is having cramps    Codeine Itching   Iron Sucrose Other (See Comments)    Flushing- required benadryl and solu medrol   Monosodium Glutamate Other (See Comments)    Cluster migraines    Scopolamine Hbr Other (See Comments)    Cluster migraines, impaired vision   Morphine Rash   Other Hives, Itching, Swelling, Rash and Other (See Comments)    Cigarette smoke    FAMILY HISTORY: Family History  Problem Relation Age of Onset   Congestive Heart Failure Mother    Diabetes Mother    Heart disease Mother    Hypertension Mother    Other Mother        ketoacidosis   Heart disease Father    Hyperlipidemia Father    Hypertension Father    Alcohol abuse Father    Congestive Heart Failure Father    Supraventricular tachycardia Daughter    Colon cancer Maternal Aunt    Breast cancer Maternal Aunt    Breast cancer Maternal Aunt  Congestive Heart Failure Maternal Aunt    Cancer Maternal Aunt    Heart attack Maternal Aunt    Breast cancer Maternal Aunt    Cancer Maternal Aunt    Rheum arthritis Maternal Aunt    Breast cancer Maternal Aunt    Breast cancer Maternal Aunt    Cancer Maternal Uncle        mets   Prostate cancer Maternal Uncle    Bone cancer Maternal Grandfather        mets   Ovarian cancer Cousin 19   Breast cancer Cousin     Objective:  *** General: No acute distress.  Patient appears well-groomed.   Head:  Normocephalic/atraumatic Eyes:  fundi examined but not visualized Neck: supple, no paraspinal tenderness, full range of motion Back: No paraspinal tenderness Heart: regular rate and rhythm Lungs: Clear to auscultation bilaterally. Vascular: No carotid bruits. Neurological Exam: Mental status: alert and oriented to person, place, and time, speech fluent and not dysarthric, language intact. Cranial nerves: CN I: not tested CN II: pupils equal, round and  reactive to light, visual fields intact CN III, IV, VI:  full range of motion, no nystagmus, no ptosis CN V: facial sensation intact. CN VII: upper and lower face symmetric CN VIII: hearing intact CN IX, X: gag intact, uvula midline CN XI: sternocleidomastoid and trapezius muscles intact CN XII: tongue midline Bulk & Tone: normal, no fasciculations. Motor:  muscle strength 5/5 throughout Sensation:  Pinprick, temperature and vibratory sensation intact. Deep Tendon Reflexes:  2+ throughout,  toes downgoing.   Finger to nose testing:  Without dysmetria.   Heel to shin:  Without dysmetria.   Gait:  Normal station and stride.  Romberg negative.    Thank you for allowing me to take part in the care of this patient.  Shon Millet, DO  CC: ***

## 2023-11-18 NOTE — Progress Notes (Signed)
Subjective: 1. Interstitial cystitis   2. Personal history of urinary infection   3. Vaginal atrophy   4. Other stricture of urethra in female   5. Pelvic pain in female   6. Vaginal pain      Consult requested by Syliva Overman MD  [name] is a 62 y.o. female who presents today for new patient visit at [facility] [place].  She is here for recurrent UTI's but she has a clear urine today and I can't find any positive cultures or UA's.  She is on premarin cream for atrophic vaginitis.  She has vaginal pain that is worse with wiping.  She will have severe pain with a UTI.  She has been using Azo which helps.  She feels like her urethra is stenosed.  She has not had recent cystoscopy.  She has had prior dilations dating back to 4th grade.   She has frequency q1hr with nocturia 1-2x unless she has a UTI and is up all night.  She has pain with a full bladder that is only partially relieved by voiding.  She has no incontinence.  On CT she has some increased pelvic fat around the rectum and the bladder is somewhat compressed anteriorly .    - Past medical history includes: Interstitial cystitis. Previously seen by Dr. Logan Bores and Dr. Retta Diones >20 years ago. Complex GI / surgical history. Per GI note on 08/09/2023: "partial colectomy due to redundant colon with primary anastomosis, perforated bowel r/t gynecologic procedure complicated by enterocutaneous fistula that required multiple surgeries and multiple SBOs with recent admission for partial SBO 10/25/21, gastroparesis, small bowel dysmotility, history of pulmonary embolism and DVT, who presents for follow up of small bowel dysmotility and gastroparesis." Constipation. Chronic abdominal pain. Previously seen at "[facility] for consideration of small bowel transplantation".   Recent history:  > 04/22/2023: CT abdomen/pelvis w/o contrast showed no GU stones, masses, or hydronephrosis. Unremarkable bladder.  > 10/15/2023: Telemedicine visit.   - She reported recurrent UTI symptoms "on avg at least once per month for this year, in the past 3 months symptomatic twice per month this month she has used keflex and septra , lst abx was approx 4 daysconstant burning , dysuriafatigue today, chills, 100, requests referral to Urology due to frequency of infections as well as treatment for presumed current infection". - She was prescribed Cipro for possible UTI, however no urine testing was performed.   11/18/23: I have reviewed her CT films and there is some anterior compression of her bladder and she appears to have pelvic lipomatosis.  ROS:  ROS  Allergies  Allergen Reactions   Nitrofurantoin Hives   Crestor [Rosuvastatin Calcium] Other (See Comments)    Generalized cramps   Bisacodyl Other (See Comments)    Makes patient feel like she is having cramps    Codeine Itching   Iron Sucrose Other (See Comments)    Flushing- required benadryl and solu medrol   Monosodium Glutamate Other (See Comments)    Cluster migraines    Scopolamine Hbr Other (See Comments)    Cluster migraines, impaired vision   Morphine Rash   Other Hives, Itching, Swelling, Rash and Other (See Comments)    Cigarette smoke    Past Medical History:  Diagnosis Date   Abdominal hernia 08/14/2019   Abdominal pain 06/06/2019   Acute bronchitis 05/01/2013   Acute cystitis 06/09/2010   Qualifier: Diagnosis of  By: Lillia Mountain LPN, Brandi     Acute renal insufficiency 08/28/2011  Allergy    Phreesia 03/16/2021   Anemia    Phreesia 10/29/2020   Anxiety    Anxiety state 03/16/2008   Qualifier: Diagnosis of  By: Lind Guest     Arthritis    Phreesia 10/29/2020   Back pain with radiation 07/17/2014   Bilateral hand pain 07/27/2016   Blood transfusion without reported diagnosis    Phreesia 10/29/2020   Breast cancer (HCC)    L breast- 2006   Cancer (HCC)    Phreesia 10/29/2020   Carpal tunnel syndrome    Chronic constipation    Chronic pain syndrome     followed by [facility]---  back   Clotting disorder (HCC)    Phreesia 10/29/2020   Depression    Dermatitis 12/15/2011   Difficult intubation    Educated about COVID-19 virus infection 05/14/2019   Fall at home 01/26/2016   Family history of adverse reaction to anesthesia    MOTHER--- PONV   Fatigue 09/17/2012   Fibromyalgia    FIBROMYALGIA 03/16/2008   Qualifier: Diagnosis of  By: Lind Guest     GERD (gastroesophageal reflux disease)    Headache disorder 01/16/2013   Headache syndrome 01/26/2016   Hip pain, right 07/08/2019   History of MRSA infection    lip abscess   History of ovarian cyst 06/2011   s/p  BSO   History of pulmonary embolus (PE) 1997   post EP with ablation pulmonary veouns for SVT/ Atrial Fib.   History of supraventricular tachycardia    s/p  ablation 1996  and 1997  by dr Graciela Husbands   History of TIA (transient ischemic attack) 1997   post op EP ablation PE   Hyperlipidemia    Hyperlipidemia LDL goal <100 03/16/2008   Qualifier: Diagnosis of  By: Lind Guest     Hypothyroidism    followed by pcp   Incisional hernia    Insomnia 12/15/2011   Intermittent palpitations 08/22/2017   Interstitial cystitis    09-13-2018   per pt last flare-up  May 2019 (followed by pcp)   INTERSTITIAL CYSTITIS 02/17/2011   Qualifier: Diagnosis of  By: Lodema Hong MD, Margaret     Iron deficiency anemia    09-13-2018  PER PT STABLE   Irregular heart rate 07/25/2013   Lipoma of back    upper   Low back pain radiating to right leg 07/04/2019   Low ferritin level 06/14/2014   MDD (major depressive disorder), single episode, in full remission (HCC) 10/27/2017   Medically noncompliant 02/28/2015   Multiple missed appointments, both follow-up appointments and lab appointments.    Metabolic syndrome X 02/10/2011   Qualifier: Diagnosis of  By: Lodema Hong MD, Margaret  hBA1c is 5.8 in 02/2013    Migraines    Morbid obesity (HCC) 03/27/2013   Muscle spasm 01/03/2020   Nausea  alone 07/17/2014   NECK PAIN, CHRONIC 03/16/2008   Qualifier: Diagnosis of  By: Lind Guest     Normal coronary arteries    a. by CT 12/2018.   Obesity 03/16/2008   Qualifier: Diagnosis of  By: Lind Guest     Oral ulceration 08/23/2014   Presented at 06/14/2014 visit    Other malaise and fatigue 03/16/2008   Centricity Description: FATIGUE, CHRONIC Qualifier: Diagnosis of  By: Lind Guest   Centricity Description: FATIGUE Qualifier: Diagnosis of  By: Garnette Czech PA, Dawn     OVARIAN CYST 12/26/2009   Qualifier: Diagnosis of  By: Lodema Hong MD, Claris Che  Paget disease of breast, left (HCC)    Paget's disease of breast, left (HCC) 03/16/2008   Qualifier: Diagnosis of  By: Lind Guest  Left diagnosed in 2006 F/h breast cancer x 15 family members   Partial small bowel obstruction (HCC) 07/04/2012   PONV (postoperative nausea and vomiting)    SEVERE   Postsurgical menopause 11/13/2011   Presence of IVC filter 06/06/2019   PSVT (paroxysmal supraventricular tachycardia) (HCC)    HX ABLATION 1996 AND 1997   Recurrent oral herpes simplex infection 10/13/2017   ROM (right otitis media) 01/23/2013   S/P insertion of IVC (inferior vena caval) filter 05/08/2005   [place] (non-retrievable)  /  dx 2019  a leg of filter is protruding thru the vena cava in to right L2 vertebral body (09-13-2018  per pt having surgery to remove filter in [place])   S/P radiofrequency ablation operation for arrhythmia 1996   1996  and 1997,   SVT and Atrial Fib   Sinusitis, chronic 01/26/2016   SMALL BOWEL OBSTRUCTION, HX OF 08/07/2008   Annotation: obstruction w/ adhesions led to partial colectomy Qualifier: Diagnosis of  By: Minna Merritts     Stroke [facility])    Phreesia 10/29/2020, TIAs in the past   Swelling of hand 09/17/2012   Syncope    Tachycardia 02/03/2010   Qualifier: Diagnosis of  By: Via LPN, Lynn     Thyroid disease    Phreesia 10/29/2020   TOS (thoracic outlet syndrome)    Ulcer 12/15/2011    Urinary frequency 07/18/2014   Vaginitis and vulvovaginitis 05/10/2013   Vitamin D deficiency 05/05/2015   Wears glasses     Past Surgical History:  Procedure Laterality Date   ABDOMINAL HYSTERECTOMY  1987   ANTERIOR CERVICAL DECOMP/DISCECTOMY FUSION  03-07-2002   dr elsner  @[facility]    C 4 -- 5   APPENDECTOMY  1980   AUGMENTATION MAMMAPLASTY Right 2006   BILATERAL SALPINGOOPHORECTOMY  07/24/2011   via Explor. Lap. w/ intraoperative perf. bowel repair   BIOPSY  05/02/2021   Procedure: BIOPSY;  Surgeon: Marguerita Merles, Reuel Boom, MD;  [facility];  Service: Gastroenterology;;  small bowel, mid esophagus, distal esophagus, random colon biopsies   BIOPSY  12/04/2021   Procedure: BIOPSY;  Surgeon: Lanelle Bal, DO;  [facility];  Service: Endoscopy;;   BIOPSY  03/31/2022   Procedure: BIOPSY;  Surgeon: Dolores Frame, MD;  [facility];  Service: Gastroenterology;;   BOTOX INJECTION N/A 03/31/2022   Procedure: BOTOX INJECTION;  Surgeon: Dolores Frame, MD;  [facility];  Service: Gastroenterology;  Laterality: N/A;   BREAST BIOPSY Right 2019   benign   BREAST ENHANCEMENT SURGERY Bilateral 1993   BREAST IMPLANT REMOVAL Bilateral    BREAST SURGERY N/A    Phreesia 10/29/2020   CARDIAC CATHETERIZATION  07-06-2003   dr Smitty Cords brodie   normal coronaries and LVF   CARDIAC ELECTROPHYSIOLOGY STUDY AND ABLATION  1996  and 1997   CARDIOVASCULAR STRESS TEST  11-18-2015   dr Graciela Husbands   normal nuclear study w/ no ischemia/  normal LV function and wall motion , ef 84%   CARPAL TUNNEL RELEASE Right ?   CESAREAN SECTION  1986   CESAREAN SECTION N/A    Phreesia 10/29/2020   CHOLECYSTECTOMY N/A    Phreesia 10/29/2020   COLON SURGERY     COLONOSCOPY WITH PROPOFOL N/A 05/02/2021   Procedure: COLONOSCOPY WITH PROPOFOL;  Surgeon: Dolores Frame, MD;  [facility];  Service: Gastroenterology;  Laterality: N/A;  12:30 PM    CYSTO/  HYDRODISTENTION/  INSTILATION THERAPY  MULTIPLE   ENTEROCUTANEOUS FISTULA CLOSURE  multiple   last one 2015 with small bowel resection   ESOPHAGOGASTRODUODENOSCOPY (EGD) WITH PROPOFOL N/A 05/02/2021   Procedure: ESOPHAGOGASTRODUODENOSCOPY (EGD) WITH PROPOFOL;  Surgeon: Dolores Frame, MD;  [facility];  Service: Gastroenterology;  Laterality: N/A;   ESOPHAGOGASTRODUODENOSCOPY (EGD) WITH PROPOFOL N/A 12/04/2021   Procedure: ESOPHAGOGASTRODUODENOSCOPY (EGD) WITH PROPOFOL;  Surgeon: Lanelle Bal, DO;  [facility];  Service: Endoscopy;  Laterality: N/A;   ESOPHAGOGASTRODUODENOSCOPY (EGD) WITH PROPOFOL N/A 03/31/2022   Procedure: ESOPHAGOGASTRODUODENOSCOPY (EGD) WITH PROPOFOL;  Surgeon: Dolores Frame, MD;  [facility];  Service: Gastroenterology;  Laterality: N/A;  1015   EXPLORATORY LAPAROTOMY INCISIONAL VENTRAL HERNIA REPAIR / RESECTION SMALL BOWEL  11-09-2014   @[facility]    FRACTURE SURGERY N/A    Phreesia 10/29/2020   JOINT REPLACEMENT N/A    Phreesia 10/29/2020   LIPOMA EXCISION Right 09/16/2018   Procedure: EXCISION LIPOMA UPPER BACK;  Surgeon: Berna Bue, MD;  Location: [facility];  Service: General;  Laterality: Right;   MASTECTOMY Left 2006   w/ reconstruction on left (paget's disease)  and right breast augmentation   PERIPHERAL VASCULAR BALLOON ANGIOPLASTY Right 09/13/2023   Procedure: PERIPHERAL VASCULAR BALLOON ANGIOPLASTY;  Surgeon: Maeola Harman, MD;  [facility];  Service: Cardiovascular;  Laterality: Right;  Subclavian and axillary   POLYPECTOMY  05/02/2021   Procedure: POLYPECTOMY;  Surgeon: Dolores Frame, MD;  [facility];  Service: Gastroenterology;;  gastric   POLYPECTOMY  03/31/2022   Procedure: POLYPECTOMY;  Surgeon: Dolores Frame, MD;  [facility];  Service: Gastroenterology;;   Gaspar Bidding DILATION  05/02/2021   Procedure:  Gaspar Bidding DILATION;  Surgeon: Dolores Frame, MD;  [facility];  Service: Gastroenterology;;   SMALL INTESTINE SURGERY N/A    Phreesia 10/29/2020   SPINE SURGERY N/A    Phreesia 10/29/2020   TOTAL COLECTOMY  08-04-2002    @[facility]    AND CHOLECYSTECTOMY  (colonic inertia)   TRANSTHORACIC ECHOCARDIOGRAM  02/04/2016   ef 60-65%,  grade 2 diastolic dysfunction/  mild MR   TUBAL LIGATION N/A    Phreesia 03/16/2021   UPPER EXTREMITY VENOGRAPHY N/A 09/13/2023   Procedure: UPPER EXTREMITY VENOGRAPHY;  Surgeon: Maeola Harman, MD;  Location: [facility] INVAS[facility] CV LAB;  Service: Cardiovascular;  Laterality: N/A;   VENA CAVA FILTER PLACEMENT  05/08/2005   @[facility]    [place] (non-retrievable)   WIDE EXCISION PERIRECTAL ABSCESSES  09-22-2005   @ [facility]    Social History   Socioeconomic History   Marital status: Married    Spouse name: Not on file   Number of children: Not on file   Years of education: Not on file   Highest education level: 12th grade  Occupational History   Not on file  Tobacco Use   Smoking status: Never   Smokeless tobacco: Never  Vaping Use   Vaping status: Never Used  Substance and Sexual Activity   Alcohol use: No   Drug use: No   Sexual activity: Not Currently    Birth control/protection: Surgical    Comment: hyst  Other Topics Concern   Not on file  Social History Narrative   Not on file   Social Determinants of Health   Financial Resource Strain: Low Risk  (10/20/2023)  Overall Financial Resource Strain (CARDIA)    Difficulty of Paying Living Expenses: Not hard at all  Food Insecurity: No Food Insecurity (10/20/2023)   Hunger Vital Sign    Worried About Running Out of Food in the Last Year: Never true    Ran Out of Food in the Last Year: Never true  Transportation Needs: No Transportation Needs (10/20/2023)   PRAPARE - Administrator, Civil Service (Medical): No    Lack of Transportation (Non-Medical): No  Physical  Activity: Sufficiently Active (10/20/2023)   Exercise Vital Sign    Days of Exercise per Week: 7 days    Minutes of Exercise per Session: 30 min  Stress: No Stress Concern Present (10/20/2023)   [employer] of Occupational Health - Occupational Stress Questionnaire    Feeling of Stress : Not at all  Social Connections: Moderately Isolated (10/20/2023)   Social Connection and Isolation Panel [NHANES]    Frequency of Communication with Friends and Family: More than three times a week    Frequency of Social Gatherings with Friends and Family: Twice a week    Attends Religious Services: Never    Database administrator or Organizations: No    Attends Banker Meetings: Never    Marital Status: Married  Catering manager Violence: Not At Risk (10/20/2023)   Humiliation, Afraid, Rape, and Kick questionnaire    Fear of Current or Ex-Partner: No    Emotionally Abused: No    Physically Abused: No    Sexually Abused: No    Family History  Problem Relation Age of Onset   Congestive Heart Failure Mother    Diabetes Mother    Heart disease Mother    Hypertension Mother    Other Mother        ketoacidosis   Heart disease Father    Hyperlipidemia Father    Hypertension Father    Alcohol abuse Father    Congestive Heart Failure Father    Supraventricular tachycardia Daughter    Colon cancer Maternal Aunt    Breast cancer Maternal Aunt    Breast cancer Maternal Aunt    Congestive Heart Failure Maternal Aunt    Cancer Maternal Aunt    Heart attack Maternal Aunt    Breast cancer Maternal Aunt    Cancer Maternal Aunt    Rheum arthritis Maternal Aunt    Breast cancer Maternal Aunt    Breast cancer Maternal Aunt    Cancer Maternal Uncle        mets   Prostate cancer Maternal Uncle    Bone cancer Maternal Grandfather        mets   Ovarian cancer Cousin 19   Breast cancer Cousin     Anti-infectives: Anti-infectives (From admission, onward)    None        Current Outpatient Medications  Medication Sig Dispense Refill   ALPRAZolam (XANAX) 1 MG tablet Take 1 tablet (1 mg total) by mouth 2 (two) times daily. 60 tablet 5   aspirin 81 MG chewable tablet Chew 81 mg by mouth daily.     butalbital-acetaminophen-caffeine (FIORICET) 50-325-40 MG tablet Take 1 tablet by mouth every 6 (six) hours as needed for headache.     Cholecalciferol (VITAMIN D3) 1.25 MG (50000 UT) CAPS Take 1 capsule by mouth every 30 (thirty) days.     conjugated estrogens (PREMARIN) vaginal cream INSERT 1 APPLICATORFUL VAGINALLY DAILY (Patient taking differently: Place 1 applicator vaginally at bedtime.) 30 g 2  cyanocobalamin (VITAMIN B12) 1000 MCG/ML injection Inject 1 mL (1,000 mcg total) into the muscle every 30 (thirty) days. 1 mL 11   cyclobenzaprine (FLEXERIL) 10 MG tablet Take 1 tablet (10 mg total) by mouth 2 (two) times daily as needed for muscle spasms. (Patient taking differently: Take 10 mg by mouth 2 (two) times daily.) 180 tablet 0   cycloSPORINE (RESTASIS) 0.05 % ophthalmic emulsion Place 1 drop into both eyes 2 (two) times daily as needed (dry eyes).     diazepam (VALIUM) 10 MG tablet Dampen and place 1 tablet vaginally at bedtime every other day. 30 tablet 0   DULoxetine (CYMBALTA) 60 MG capsule Take 60 mg by mouth daily.     erythromycin (ERY-TAB) 250 MG EC tablet TAKE 1 TABLET BY MOUTH THREE TIMES DAILY BEFORE MEALS (Patient taking differently: Take 250 mg by mouth 3 (three) times daily.) 270 tablet 2   ezetimibe (ZETIA) 10 MG tablet TAKE 1 TABLET(10 MG) BY MOUTH DAILY 90 tablet 3   fenofibrate (TRICOR) 48 MG tablet Take 1 tablet (48 mg total) by mouth daily. 30 tablet 5   furosemide (LASIX) 20 MG tablet TAKE 1 TABLET(20 MG) BY MOUTH DAILY 30 tablet 3   HYDROmorphone (DILAUDID) 4 MG tablet Take 4 mg by mouth 3 (three) times daily.     levothyroxine (SYNTHROID) 125 MCG tablet TAKE 1 TABLET(125 MCG) BY MOUTH DAILY BEFORE BREAKFAST 90 tablet 1   lubiprostone  (AMITIZA) 24 MCG capsule TAKE 1 CAPSULE(24 MCG) BY MOUTH TWICE DAILY WITH A MEAL 60 capsule 5   magnesium oxide (MAG-OX) 400 (240 Mg) MG tablet TAKE 1 TABLET BY MOUTH FOUR TIMES DAILY (Patient taking differently: Take 400 mg by mouth 2 (two) times daily.) 180 tablet 0   MegaRed Omega-3 Krill Oil 500 MG CAPS Take 500 mg by mouth at bedtime.     Melatonin 10 MG TABS Take 10 mg by mouth at bedtime as needed (Sleep).     metFORMIN (GLUCOPHAGE-XR) 500 MG 24 hr tablet Take 500 mg by mouth 2 (two) times daily with a meal.     metoprolol succinate (TOPROL-XL) 25 MG 24 hr tablet TAKE 1 TABLET(25 MG) BY MOUTH DAILY (Patient taking differently: Take 25 mg by mouth 2 (two) times daily.) 90 tablet 2   Multiple Vitamins-Minerals (ICAPS AREDS 2 PO) Take 1 capsule by mouth at bedtime.     naloxone (NARCAN) nasal spray 4 mg/0.1 mL Place 1 spray into the nose as needed (opioid reversal).     omeprazole (PRILOSEC) 40 MG capsule Take 1 capsule (40 mg total) by mouth in the morning and at bedtime. 180 capsule 3   OVER THE COUNTER MEDICATION Take 2 tablets by mouth 2 (two) times daily. Nutra-ful     phenazopyridine (PYRIDIUM) 95 MG tablet Take by mouth 3 (three) times daily as needed. sd     Polyethyl Glycol-Propyl Glycol (SYSTANE ULTRA) 0.4-0.3 % SOLN Place 1 drop into both eyes daily.     polyethylene glycol (MIRALAX / GLYCOLAX) 17 g packet Take 17 g by mouth daily as needed for mild constipation or moderate constipation.     potassium chloride (KLOR-CON M) 10 MEQ tablet Take 1 tablet (10 mEq total) by mouth 2 (two) times daily. 180 tablet 3   pravastatin (PRAVACHOL) 20 MG tablet TAKE 1 TABLET(20 MG) BY MOUTH DAILY 90 tablet 1   promethazine (PHENERGAN) 12.5 MG tablet Take 12.5 mg by mouth every 6 (six) hours as needed for nausea or vomiting.  sennosides-docusate sodium (SENOKOT-S) 8.6-50 MG tablet Take 2 tablets by mouth 3 (three) times daily.     XARELTO 20 MG TABS tablet TAKE 1 TABLET(20 MG) BY MOUTH DAILY 30  tablet 1   No current facility-administered medications for this visit.     Objective: Vital signs in last 24 hours: BP 96/69   Pulse (!) 106   Ht 5\' 4"  (1.626 m)   Wt 197 lb (89.4 kg)   BMI 33.81 kg/m   Intake/Output from previous day: No intake/output data recorded. Intake/Output this shift: @IOTHISSHIFT @   Physical Exam Vitals reviewed.  Constitutional:      Appearance: Normal appearance. She is obese.  Cardiovascular:     Rate and Rhythm: Normal rate and regular rhythm.  Pulmonary:     Effort: Pulmonary effort is normal. No respiratory distress.  Abdominal:     Comments: Obese with midline scar and lower abdominal incisional hernia. Suprapubic tenderness. No mass.   Genitourinary:    Comments: Nl external genitalia. Moderate introital stenosis. Mod/severe vaginal mucosal atrophy. Nl urethral meatus.  No prolapse. S/P hysterectomy. Bladder tender without mass. Lateral pelvic side wall tenderness.  No posterior tenderness.  Musculoskeletal:        General: Normal range of motion.  Skin:    General: Skin is warm and dry.  Neurological:     General: No focal deficit present.     Mental Status: She is alert and oriented to person, place, and time.     Lab Results:  Results for orders placed or performed in visit on 11/18/23 (from the past 24 hour(s))  Urinalysis, Routine w reflex microscopic     Status: Abnormal   Collection Time: 11/18/23  2:03 PM  Result Value Ref Range   Specific Gravity, UA 1.025 1.005 - 1.030   pH, UA 6.0 5.0 - 7.5   Color, UA Yellow Yellow   Appearance Ur Clear Clear   Leukocytes,UA Negative Negative   Protein,UA Trace Negative/Trace   Glucose, UA Negative Negative   Ketones, UA 1+ (A) Negative   RBC, UA Negative Negative   Bilirubin, UA Negative Negative   Urobilinogen, Ur 1.0 0.2 - 1.0 mg/dL   Nitrite, UA Negative Negative   Microscopic Examination Comment    Narrative   Performed at:  [address] - [facility] [address] [place], [place]  [id number] Lab Director: Chinita Pester MT, Phone:  [phone]   *Note: Due to a large number of results and/or encounters for the requested time period, some results have not been displayed. A complete set of results can be found in Results Review.    BMET No results for input(s): "NA", "K", "CL", "CO2", "GLUCOSE", "BUN", "CREATININE", "CALCIUM" in the last 72 hours. PT/INR No results for input(s): "LABPROT", "INR" in the last 72 hours. ABG No results for input(s): "PHART", "HCO3" in the last 72 hours.  Invalid input(s): "PCO2", "PO2" UA is clear.  Cr was normal on last labs.  Studies/Results: No results found. I have reviewed extensive records in EP[facility] and Mx CT scans.    Assessment/Plan: History of UTI's - I don't see clear evidence that she has had actual UTI's but she has responded to antibiotics.  Her UA is clear today.   Pelvic and Vulvovaginal pain with atrophy - She should continue the topical estrogen and lidocaine that she uses.   She is to see Dr. Billy Coast and his opinion would be worthwhile.   Pelvic floor PT could be of value. I will try  10mg  vaginal valium tabs every other day.   History of urethral stenosis and IC - It would be worthwhile to proceed with cystoscopy with urethral dilation and HOD to reassess her anatomy.    Increased pelvic fat with some anterior displacement of the bladder.  She could have pelvic lipomatosis but it has been stable for years and she is not a candidate for Celebrex because of the Xarelto.   Meds ordered this encounter  Medications   diazepam (VALIUM) 10 MG tablet    Sig: Dampen and place 1 tablet vaginally at bedtime every other day.    Dispense:  30 tablet    Refill:  0     Orders Placed This Encounter  Procedures   Urinalysis, Routine w reflex microscopic   BLADDER SCAN AMB NON-IMAGING     Return for I will get her scheduled for cystoscopy with hydrodistention. .    CC: Dr. Syliva Overman.       Bjorn Pippin 11/19/2023

## 2023-11-18 NOTE — Progress Notes (Signed)
Surgery sheet faxed to [facility] Release: [id number]

## 2023-11-18 NOTE — Progress Notes (Signed)
post void residual=0 ?

## 2023-11-19 ENCOUNTER — Ambulatory Visit: Payer: Medicare Other | Admitting: Neurology

## 2023-11-19 ENCOUNTER — Telehealth: Payer: Self-pay

## 2023-11-19 NOTE — Telephone Encounter (Signed)
VOB submitted Orthovisc, bilateral knee

## 2023-11-20 DIAGNOSIS — M5489 Other dorsalgia: Secondary | ICD-10-CM | POA: Diagnosis not present

## 2023-11-20 DIAGNOSIS — E6609 Other obesity due to excess calories: Secondary | ICD-10-CM | POA: Diagnosis not present

## 2023-11-23 DIAGNOSIS — R102 Pelvic and perineal pain: Secondary | ICD-10-CM | POA: Diagnosis not present

## 2023-11-23 DIAGNOSIS — N952 Postmenopausal atrophic vaginitis: Secondary | ICD-10-CM | POA: Diagnosis not present

## 2023-11-23 DIAGNOSIS — N951 Menopausal and female climacteric states: Secondary | ICD-10-CM | POA: Diagnosis not present

## 2023-11-24 ENCOUNTER — Ambulatory Visit: Payer: Medicare Other | Admitting: Vascular Surgery

## 2023-12-01 ENCOUNTER — Encounter: Payer: Self-pay | Admitting: Family Medicine

## 2023-12-03 ENCOUNTER — Encounter: Payer: Self-pay | Admitting: Family Medicine

## 2023-12-03 ENCOUNTER — Ambulatory Visit (INDEPENDENT_AMBULATORY_CARE_PROVIDER_SITE_OTHER): Payer: Medicare Other | Admitting: Family Medicine

## 2023-12-03 VITALS — BP 138/86 | HR 97 | Ht 64.5 in | Wt 193.0 lb

## 2023-12-03 DIAGNOSIS — R300 Dysuria: Secondary | ICD-10-CM

## 2023-12-03 DIAGNOSIS — M48061 Spinal stenosis, lumbar region without neurogenic claudication: Secondary | ICD-10-CM

## 2023-12-03 DIAGNOSIS — M5416 Radiculopathy, lumbar region: Secondary | ICD-10-CM

## 2023-12-03 DIAGNOSIS — E782 Mixed hyperlipidemia: Secondary | ICD-10-CM | POA: Diagnosis not present

## 2023-12-03 DIAGNOSIS — N390 Urinary tract infection, site not specified: Secondary | ICD-10-CM | POA: Diagnosis not present

## 2023-12-03 MED ORDER — METHYLPREDNISOLONE ACETATE 80 MG/ML IJ SUSP
80.0000 mg | Freq: Once | INTRAMUSCULAR | Status: AC
Start: 2023-12-03 — End: 2023-12-03
  Administered 2023-12-03: 80 mg via INTRAMUSCULAR

## 2023-12-03 MED ORDER — CEPHALEXIN 500 MG PO CAPS: 500.0000 mg | ORAL_CAPSULE | Freq: Two times a day (BID) | ORAL | 0 refills | Status: AC

## 2023-12-03 MED ORDER — KETOROLAC TROMETHAMINE 60 MG/2ML IM SOLN
60.0000 mg | Freq: Once | INTRAMUSCULAR | Status: AC
Start: 2023-12-03 — End: 2023-12-03
  Administered 2023-12-03: 60 mg via INTRAMUSCULAR

## 2023-12-03 MED ORDER — PHENAZOPYRIDINE HCL 200 MG PO TABS
200.0000 mg | ORAL_TABLET | Freq: Three times a day (TID) | ORAL | 0 refills | Status: AC | PRN
Start: 2023-12-03 — End: 2023-12-05

## 2023-12-03 NOTE — Assessment & Plan Note (Signed)
The patient will be treated today with Toradol and Depo-Medrol in the clinic. I have encouraged the patient to monitor for any signs of bleeding and to report symptoms such as unexplained bruising, nosebleeds, gum bleeding, blood in the urine or stool, heavy or prolonged menstrual bleeding, vomiting blood or coughing up blood, unusual or severe headaches (which could be a sign of internal bleeding), and dizziness or weakness. If any of these symptoms occur, the patient should contact the clinic immediately or seek urgent medical attention.

## 2023-12-03 NOTE — Progress Notes (Signed)
Acute Office Visit  Subjective:    Patient ID: [name], female    DOB: 07/29/1961, 62 y.o.   MRN: [id number]  Chief Complaint  Patient presents with   Back Pain    Reports back pain, states was recently told she has scoliosis, would like a steroid shot/torodal. Pain is a 10/10.    HPI The patient presents today with complaints of chronic lumbar pain, which she rates as 10 out of 10. The pain worsens when lying on her back or supine. She reports that the pain radiates to her right buttock. The patient is currently following up with pain management at [facility] and states that she received Toradol and steroid injections 3 weeks ago for severe lower back pain. Several imaging studies completed at [facility] revealed degenerative disc disease of the lumbar spine with bone spurs and mild scaly uses of the spine as reported by the patient. The patient has 3 IVC filters in her upper and lower spine. She currently takes hydromorphone 4 mg, three times daily as needed for pain relief. She denies any fever or chills and reports no bowel or bladder incontinence. The patient is ambulating without deformity or abnormalities in the clinic. No recent injury or trauma is noted.  Cystitis: The patient complains of burning with urination that started on 12/03/2023. She reports her last UTI treatment was 7 months ago at urgent care, where she did receive Rocephin. The patient notes having experienced 4-5 UTI episodes this year and is currently under the care of urology. Of note, the patient has a history of interstitial cystitis. She reports using over-the-counter Azo to help manage dysuria symptoms.  Past Medical History:  Diagnosis Date   Abdominal hernia 08/14/2019   Abdominal pain 06/06/2019   Acute bronchitis 05/01/2013   Acute cystitis 06/09/2010   Qualifier: Diagnosis of  By: Lillia Mountain LPN, Brandi     Acute renal insufficiency 08/28/2011   Allergy    Phreesia 03/16/2021   Anemia     Phreesia 10/29/2020   Anxiety    Anxiety state 03/16/2008   Qualifier: Diagnosis of  By: Lind Guest     Arthritis    Phreesia 10/29/2020   Back pain with radiation 07/17/2014   Bilateral hand pain 07/27/2016   Blood transfusion without reported diagnosis    Phreesia 10/29/2020   Breast cancer (HCC)    L breast- 2006   Cancer (HCC)    Phreesia 10/29/2020   Carpal tunnel syndrome    Chronic constipation    Chronic pain syndrome    followed by [facility]---  back   Clotting disorder (HCC)    Phreesia 10/29/2020   Depression    Dermatitis 12/15/2011   Difficult intubation    Educated about COVID-19 virus infection 05/14/2019   Fall at home 01/26/2016   Family history of adverse reaction to anesthesia    MOTHER--- PONV   Fatigue 09/17/2012   Fibromyalgia    FIBROMYALGIA 03/16/2008   Qualifier: Diagnosis of  By: Lind Guest     GERD (gastroesophageal reflux disease)    Headache disorder 01/16/2013   Headache syndrome 01/26/2016   Hip pain, right 07/08/2019   History of MRSA infection    lip abscess   History of ovarian cyst 06/2011   s/p  BSO   History of pulmonary embolus (PE) 1997   post EP with ablation pulmonary veouns for SVT/ Atrial Fib.   History of supraventricular tachycardia    s/p  ablation 1996  and 1997  by dr Graciela Husbands   History of TIA (transient ischemic attack) 1997   post op EP ablation PE   Hyperlipidemia    Hyperlipidemia LDL goal <100 03/16/2008   Qualifier: Diagnosis of  By: Lind Guest     Hypothyroidism    followed by pcp   Incisional hernia    Insomnia 12/15/2011   Intermittent palpitations 08/22/2017   Interstitial cystitis    09-13-2018   per pt last flare-up  May 2019 (followed by pcp)   INTERSTITIAL CYSTITIS 02/17/2011   Qualifier: Diagnosis of  By: Lodema Hong MD, Margaret     Iron deficiency anemia    09-13-2018  PER PT STABLE   Irregular heart rate 07/25/2013   Lipoma of back    upper   Low back pain radiating to right  leg 07/04/2019   Low ferritin level 06/14/2014   MDD (major depressive disorder), single episode, in full remission (HCC) 10/27/2017   Medically noncompliant 02/28/2015   Multiple missed appointments, both follow-up appointments and lab appointments.    Metabolic syndrome X 02/10/2011   Qualifier: Diagnosis of  By: Lodema Hong MD, Margaret  hBA1c is 5.8 in 02/2013    Migraines    Morbid obesity (HCC) 03/27/2013   Muscle spasm 01/03/2020   Nausea alone 07/17/2014   NECK PAIN, CHRONIC 03/16/2008   Qualifier: Diagnosis of  By: Lind Guest     Normal coronary arteries    a. by CT 12/2018.   Obesity 03/16/2008   Qualifier: Diagnosis of  By: Lind Guest     Oral ulceration 08/23/2014   Presented at 06/14/2014 visit    Other malaise and fatigue 03/16/2008   Centricity Description: FATIGUE, CHRONIC Qualifier: Diagnosis of  By: Lind Guest   Centricity Description: FATIGUE Qualifier: Diagnosis of  By: Garnette Czech PA, Dawn     OVARIAN CYST 12/26/2009   Qualifier: Diagnosis of  By: Lodema Hong MD, Margaret     Paget disease of breast, left [facility])    Paget's disease of breast, left (HCC) 03/16/2008   Qualifier: Diagnosis of  By: Lind Guest  Left diagnosed in 2006 F/h breast cancer x 15 family members   Partial small bowel obstruction (HCC) 07/04/2012   PONV (postoperative nausea and vomiting)    SEVERE   Postsurgical menopause 11/13/2011   Presence of IVC filter 06/06/2019   PSVT (paroxysmal supraventricular tachycardia) (HCC)    HX ABLATION 1996 AND 1997   Recurrent oral herpes simplex infection 10/13/2017   ROM (right otitis media) 01/23/2013   S/P insertion of IVC (inferior vena caval) filter 05/08/2005   [place] (non-retrievable)  /  dx 2019  a leg of filter is protruding thru the vena cava in to right L2 vertebral body (09-13-2018  per pt having surgery to remove filter in [place])   S/P radiofrequency ablation operation for arrhythmia 1996   1996  and 1997,   SVT and Atrial Fib    Sinusitis, chronic 01/26/2016   SMALL BOWEL OBSTRUCTION, HX OF 08/07/2008   Annotation: obstruction w/ adhesions led to partial colectomy Qualifier: Diagnosis of  By: Minna Merritts     Stroke [facility])    Phreesia 10/29/2020, TIAs in the past   Swelling of hand 09/17/2012   Syncope    Tachycardia 02/03/2010   Qualifier: Diagnosis of  By: Via LPN, Lynn     Thyroid disease    Phreesia 10/29/2020   TOS (thoracic outlet syndrome)    Ulcer 12/15/2011   Urinary frequency 07/18/2014   Vaginitis and  vulvovaginitis 05/10/2013   Vitamin D deficiency 05/05/2015   Wears glasses     Past Surgical History:  Procedure Laterality Date   ABDOMINAL HYSTERECTOMY  1987   ANTERIOR CERVICAL DECOMP/DISCECTOMY FUSION  03-07-2002   dr elsner  @[facility]    C 4 -- 5   APPENDECTOMY  1980   AUGMENTATION MAMMAPLASTY Right 2006   BILATERAL SALPINGOOPHORECTOMY  07/24/2011   via Explor. Lap. w/ intraoperative perf. bowel repair   BIOPSY  05/02/2021   Procedure: BIOPSY;  Surgeon: Marguerita Merles, Reuel Boom, MD;  [facility];  Service: Gastroenterology;;  small bowel, mid esophagus, distal esophagus, random colon biopsies   BIOPSY  12/04/2021   Procedure: BIOPSY;  Surgeon: Lanelle Bal, DO;  [facility];  Service: Endoscopy;;   BIOPSY  03/31/2022   Procedure: BIOPSY;  Surgeon: Dolores Frame, MD;  [facility];  Service: Gastroenterology;;   BOTOX INJECTION N/A 03/31/2022   Procedure: BOTOX INJECTION;  Surgeon: Dolores Frame, MD;  [facility];  Service: Gastroenterology;  Laterality: N/A;   BREAST BIOPSY Right 2019   benign   BREAST ENHANCEMENT SURGERY Bilateral 1993   BREAST IMPLANT REMOVAL Bilateral    BREAST SURGERY N/A    Phreesia 10/29/2020   CARDIAC CATHETERIZATION  07-06-2003   dr Smitty Cords brodie   normal coronaries and LVF   CARDIAC ELECTROPHYSIOLOGY STUDY AND ABLATION  1996  and 1997   CARDIOVASCULAR STRESS TEST  11-18-2015   dr Graciela Husbands    normal nuclear study w/ no ischemia/  normal LV function and wall motion , ef 84%   CARPAL TUNNEL RELEASE Right ?   CESAREAN SECTION  1986   CESAREAN SECTION N/A    Phreesia 10/29/2020   CHOLECYSTECTOMY N/A    Phreesia 10/29/2020   COLON SURGERY     COLONOSCOPY WITH PROPOFOL N/A 05/02/2021   Procedure: COLONOSCOPY WITH PROPOFOL;  Surgeon: Dolores Frame, MD;  [facility];  Service: Gastroenterology;  Laterality: N/A;  12:30 PM   CYSTO/  HYDRODISTENTION/  INSTILATION THERAPY  MULTIPLE   ENTEROCUTANEOUS FISTULA CLOSURE  multiple   last one 2015 with small bowel resection   ESOPHAGOGASTRODUODENOSCOPY (EGD) WITH PROPOFOL N/A 05/02/2021   Procedure: ESOPHAGOGASTRODUODENOSCOPY (EGD) WITH PROPOFOL;  Surgeon: Dolores Frame, MD;  [facility];  Service: Gastroenterology;  Laterality: N/A;   ESOPHAGOGASTRODUODENOSCOPY (EGD) WITH PROPOFOL N/A 12/04/2021   Procedure: ESOPHAGOGASTRODUODENOSCOPY (EGD) WITH PROPOFOL;  Surgeon: Lanelle Bal, DO;  [facility];  Service: Endoscopy;  Laterality: N/A;   ESOPHAGOGASTRODUODENOSCOPY (EGD) WITH PROPOFOL N/A 03/31/2022   Procedure: ESOPHAGOGASTRODUODENOSCOPY (EGD) WITH PROPOFOL;  Surgeon: Dolores Frame, MD;  [facility];  Service: Gastroenterology;  Laterality: N/A;  1015   EXPLORATORY LAPAROTOMY INCISIONAL VENTRAL HERNIA REPAIR / RESECTION SMALL BOWEL  11-09-2014   @[facility]    FRACTURE SURGERY N/A    Phreesia 10/29/2020   JOINT REPLACEMENT N/A    Phreesia 10/29/2020   LIPOMA EXCISION Right 09/16/2018   Procedure: EXCISION LIPOMA UPPER BACK;  Surgeon: Berna Bue, MD;  Location: [facility];  Service: General;  Laterality: Right;   MASTECTOMY Left 2006   w/ reconstruction on left (paget's disease)  and right breast augmentation   PERIPHERAL VASCULAR BALLOON ANGIOPLASTY Right 09/13/2023   Procedure: PERIPHERAL VASCULAR BALLOON ANGIOPLASTY;  Surgeon: Maeola Harman, MD;  Location: [facility] CV [facility];  Service: Cardiovascular;  Laterality: Right;  Subclavian and axillary   POLYPECTOMY  05/02/2021  Procedure: POLYPECTOMY;  Surgeon: Dolores Frame, MD;  [facility];  Service: Gastroenterology;;  gastric   POLYPECTOMY  03/31/2022   Procedure: POLYPECTOMY;  Surgeon: Dolores Frame, MD;  [facility];  Service: Gastroenterology;;   Gaspar Bidding DILATION  05/02/2021   Procedure: Gaspar Bidding DILATION;  Surgeon: Dolores Frame, MD;  [facility];  Service: Gastroenterology;;   SMALL INTESTINE SURGERY N/A    Phreesia 10/29/2020   SPINE SURGERY N/A    Phreesia 10/29/2020   TOTAL COLECTOMY  08-04-2002    @[facility]    AND CHOLECYSTECTOMY  (colonic inertia)   TRANSTHORACIC ECHOCARDIOGRAM  02/04/2016   ef 60-65%,  grade 2 diastolic dysfunction/  mild MR   TUBAL LIGATION N/A    Phreesia 03/16/2021   UPPER EXTREMITY VENOGRAPHY N/A 09/13/2023   Procedure: UPPER EXTREMITY VENOGRAPHY;  Surgeon: Maeola Harman, MD;  [facility] INVAS[facility] CV LAB;  Service: Cardiovascular;  Laterality: N/A;   VENA CAVA FILTER PLACEMENT  05/08/2005   @[facility]    [place] (non-retrievable)   WIDE EXCISION PERIRECTAL ABSCESSES  09-22-2005   @ [facility]    Family History  Problem Relation Age of Onset   Congestive Heart Failure Mother    Diabetes Mother    Heart disease Mother    Hypertension Mother    Other Mother        ketoacidosis   Heart disease Father    Hyperlipidemia Father    Hypertension Father    Alcohol abuse Father    Congestive Heart Failure Father    Supraventricular tachycardia Daughter    Colon cancer Maternal Aunt    Breast cancer Maternal Aunt    Breast cancer Maternal Aunt    Congestive Heart Failure Maternal Aunt    Cancer Maternal Aunt    Heart attack Maternal Aunt    Breast cancer Maternal Aunt    Cancer Maternal Aunt    Rheum arthritis Maternal Aunt    Breast cancer Maternal Aunt    Breast  cancer Maternal Aunt    Cancer Maternal Uncle        mets   Prostate cancer Maternal Uncle    Bone cancer Maternal Grandfather        mets   Ovarian cancer Cousin 19   Breast cancer Cousin     Social History   Socioeconomic History   Marital status: Married    Spouse name: Not on file   Number of children: Not on file   Years of education: Not on file   Highest education level: 12th grade  Occupational History   Not on file  Tobacco Use   Smoking status: Never   Smokeless tobacco: Never  Vaping Use   Vaping status: Never Used  Substance and Sexual Activity   Alcohol use: No   Drug use: No   Sexual activity: Not Currently    Birth control/protection: Surgical    Comment: hyst  Other Topics Concern   Not on file  Social History Narrative   Not on file   Social Determinants of Health   Financial Resource Strain: Low Risk  (10/20/2023)   Overall Financial Resource Strain (CARDIA)    Difficulty of Paying Living Expenses: Not hard at all  Food Insecurity: No Food Insecurity (10/20/2023)   Hunger Vital Sign    Worried About Running Out of Food in the Last Year: Never true    Ran Out of Food in the Last Year: Never true  Transportation Needs: No Transportation Needs (10/20/2023)  PRAPARE - Administrator, Civil Service (Medical): No    Lack of Transportation (Non-Medical): No  Physical Activity: Sufficiently Active (10/20/2023)   Exercise Vital Sign    Days of Exercise per Week: 7 days    Minutes of Exercise per Session: 30 min  Stress: No Stress Concern Present (10/20/2023)   [employer] of Occupational Health - Occupational Stress Questionnaire    Feeling of Stress : Not at all  Social Connections: Moderately Isolated (10/20/2023)   Social Connection and Isolation Panel [NHANES]    Frequency of Communication with Friends and Family: More than three times a week    Frequency of Social Gatherings with Friends and Family: Twice a week    Attends  Religious Services: Never    Database administrator or Organizations: No    Attends Banker Meetings: Never    Marital Status: Married  Catering manager Violence: Not At Risk (10/20/2023)   Humiliation, Afraid, Rape, and Kick questionnaire    Fear of Current or Ex-Partner: No    Emotionally Abused: No    Physically Abused: No    Sexually Abused: No    Outpatient Medications Prior to Visit  Medication Sig Dispense Refill   ALPRAZolam (XANAX) 1 MG tablet Take 1 tablet (1 mg total) by mouth 2 (two) times daily. 60 tablet 5   aspirin 81 MG chewable tablet Chew 81 mg by mouth daily.     butalbital-acetaminophen-caffeine (FIORICET) 50-325-40 MG tablet Take 1 tablet by mouth every 6 (six) hours as needed for headache.     Cholecalciferol (VITAMIN D3) 1.25 MG (50000 UT) CAPS Take 1 capsule by mouth every 30 (thirty) days.     conjugated estrogens (PREMARIN) vaginal cream INSERT 1 APPLICATORFUL VAGINALLY DAILY (Patient taking differently: Place 1 applicator vaginally at bedtime.) 30 g 2   cyanocobalamin (VITAMIN B12) 1000 MCG/ML injection Inject 1 mL (1,000 mcg total) into the muscle every 30 (thirty) days. 1 mL 11   cyclobenzaprine (FLEXERIL) 10 MG tablet Take 1 tablet (10 mg total) by mouth 2 (two) times daily as needed for muscle spasms. (Patient taking differently: Take 10 mg by mouth 2 (two) times daily.) 180 tablet 0   cycloSPORINE (RESTASIS) 0.05 % ophthalmic emulsion Place 1 drop into both eyes 2 (two) times daily as needed (dry eyes).     diazepam (VALIUM) 10 MG tablet Dampen and place 1 tablet vaginally at bedtime every other day. 30 tablet 0   DULoxetine (CYMBALTA) 60 MG capsule Take 60 mg by mouth daily.     erythromycin (ERY-TAB) 250 MG EC tablet TAKE 1 TABLET BY MOUTH THREE TIMES DAILY BEFORE MEALS (Patient taking differently: Take 250 mg by mouth 3 (three) times daily.) 270 tablet 2   ezetimibe (ZETIA) 10 MG tablet TAKE 1 TABLET(10 MG) BY MOUTH DAILY 90 tablet 3    fenofibrate (TRICOR) 48 MG tablet Take 1 tablet (48 mg total) by mouth daily. 30 tablet 5   furosemide (LASIX) 20 MG tablet TAKE 1 TABLET(20 MG) BY MOUTH DAILY 30 tablet 3   HYDROmorphone (DILAUDID) 4 MG tablet Take 4 mg by mouth 3 (three) times daily.     levothyroxine (SYNTHROID) 125 MCG tablet TAKE 1 TABLET(125 MCG) BY MOUTH DAILY BEFORE BREAKFAST 90 tablet 1   lubiprostone (AMITIZA) 24 MCG capsule TAKE 1 CAPSULE(24 MCG) BY MOUTH TWICE DAILY WITH A MEAL 60 capsule 5   magnesium oxide (MAG-OX) 400 (240 Mg) MG tablet TAKE 1 TABLET BY MOUTH FOUR TIMES  DAILY (Patient taking differently: Take 400 mg by mouth 2 (two) times daily.) 180 tablet 0   MegaRed Omega-3 Krill Oil 500 MG CAPS Take 500 mg by mouth at bedtime.     Melatonin 10 MG TABS Take 10 mg by mouth at bedtime as needed (Sleep).     metFORMIN (GLUCOPHAGE-XR) 500 MG 24 hr tablet Take 500 mg by mouth 2 (two) times daily with a meal.     metoprolol succinate (TOPROL-XL) 25 MG 24 hr tablet TAKE 1 TABLET(25 MG) BY MOUTH DAILY (Patient taking differently: Take 25 mg by mouth 2 (two) times daily.) 90 tablet 2   Multiple Vitamins-Minerals (ICAPS AREDS 2 PO) Take 1 capsule by mouth at bedtime.     naloxone (NARCAN) nasal spray 4 mg/0.1 mL Place 1 spray into the nose as needed (opioid reversal).     omeprazole (PRILOSEC) 40 MG capsule Take 1 capsule (40 mg total) by mouth in the morning and at bedtime. 180 capsule 3   OVER THE COUNTER MEDICATION Take 2 tablets by mouth 2 (two) times daily. Nutra-ful     phenazopyridine (PYRIDIUM) 95 MG tablet Take by mouth 3 (three) times daily as needed. sd     Polyethyl Glycol-Propyl Glycol (SYSTANE ULTRA) 0.4-0.3 % SOLN Place 1 drop into both eyes daily.     polyethylene glycol (MIRALAX / GLYCOLAX) 17 g packet Take 17 g by mouth daily as needed for mild constipation or moderate constipation.     potassium chloride (KLOR-CON M) 10 MEQ tablet Take 1 tablet (10 mEq total) by mouth 2 (two) times daily. 180 tablet 3    pravastatin (PRAVACHOL) 20 MG tablet TAKE 1 TABLET(20 MG) BY MOUTH DAILY 90 tablet 1   promethazine (PHENERGAN) 12.5 MG tablet Take 12.5 mg by mouth every 6 (six) hours as needed for nausea or vomiting.     sennosides-docusate sodium (SENOKOT-S) 8.6-50 MG tablet Take 2 tablets by mouth 3 (three) times daily.     XARELTO 20 MG TABS tablet TAKE 1 TABLET(20 MG) BY MOUTH DAILY 30 tablet 1   No facility-administered medications prior to visit.    Allergies  Allergen Reactions   Nitrofurantoin Hives   Crestor [Rosuvastatin Calcium] Other (See Comments)    Generalized cramps   Bisacodyl Other (See Comments)    Makes patient feel like she is having cramps    Codeine Itching   Iron Sucrose Other (See Comments)    Flushing- required benadryl and solu medrol   Monosodium Glutamate Other (See Comments)    Cluster migraines    Scopolamine Hbr Other (See Comments)    Cluster migraines, impaired vision   Morphine Rash   Other Hives, Itching, Swelling, Rash and Other (See Comments)    Cigarette smoke    Review of Systems  Constitutional:  Negative for chills and fever.  Eyes:  Negative for visual disturbance.  Respiratory:  Negative for chest tightness and shortness of breath.   Genitourinary:  Positive for dysuria. Negative for hematuria and urgency.  Musculoskeletal:  Positive for back pain.  Neurological:  Negative for dizziness and headaches.       Objective:    Physical Exam HENT:     Head: Normocephalic.     Mouth/Throat:     Mouth: Mucous membranes are moist.  Cardiovascular:     Rate and Rhythm: Normal rate.     Heart sounds: Normal heart sounds.  Pulmonary:     Effort: Pulmonary effort is normal.     Breath sounds: Normal  breath sounds.  Abdominal:     Tenderness: There is no abdominal tenderness. There is right CVA tenderness and left CVA tenderness.  Neurological:     Mental Status: She is alert.     BP 138/86   Pulse 97   Ht 5' 4.5" (1.638 m)   Wt 193 lb 0.6  oz (87.6 kg)   SpO2 92%   BMI 32.62 kg/m  Wt Readings from Last 3 Encounters:  12/03/23 193 lb 0.6 oz (87.6 kg)  11/18/23 197 lb (89.4 kg)  11/15/23 197 lb (89.4 kg)       Assessment & Plan:  Spinal stenosis of lumbar region with radiculopathy Assessment & Plan: The patient will be treated today with Toradol and Depo-Medrol in the clinic. I have encouraged the patient to monitor for any signs of bleeding and to report symptoms such as unexplained bruising, nosebleeds, gum bleeding, blood in the urine or stool, heavy or prolonged menstrual bleeding, vomiting blood or coughing up blood, unusual or severe headaches (which could be a sign of internal bleeding), and dizziness or weakness. If any of these symptoms occur, the patient should contact the clinic immediately or seek urgent medical attention.   Orders: -     methylPREDNISolone Acetate -     Ketorolac Tromethamine  Recurrent UTI (urinary tract infection) Assessment & Plan: [name] urinalysis in the clinic is positive for nitrates. We will initiate therapy with Keflex 500 mg, to be taken twice daily for 3 days, and Pyridium 200 mg, to be taken three times daily for dysuria, which has been sent to her pharmacy. The patient was advised to wear panty liners during the treatment regimen, as the medication may cause discoloration of her urine. She was also encouraged to complete the full course of antibiotics and follow up with her urologist as scheduled.   To help prevent future urinary tract infections (UTIs), the patient was advised to avoid prolonged urination, promptly empty the bladder when feeling the urge, and empty the bladder after intercourse. Additionally, she was encouraged to shower instead of taking baths to minimize bacterial exposure, to wipe from front to back to prevent bacterial transfer, and to stay well-hydrated by drinking a full glass of water regularly to flush bacteria from her system.   Orders: -      Urinalysis -     Urine Culture -     Cephalexin; Take 1 capsule (500 mg total) by mouth 2 (two) times daily for 3 days.  Dispense: 6 capsule; Refill: 0  Dysuria Assessment & Plan: Encouraged to take phenazopyridine 200 mg TID for 2 days    Orders: -     Phenazopyridine HCl; Take 1 tablet (200 mg total) by mouth 3 (three) times daily as needed for up to 2 days for pain.  Dispense: 6 tablet; Refill: 0  Note: This chart has been completed using Engineer, civil (consulting) software, and while attempts have been made to ensure accuracy, certain words and phrases may not be transcribed as intended.    Gilmore Laroche, FNP

## 2023-12-03 NOTE — Patient Instructions (Addendum)
I appreciate the opportunity to provide care to you today!    You received Toradol and Depo-Medrol in the clinic. Please monitor for any signs of bleeding, such as:  Unexplained bruising Nosebleeds Gum bleeding Blood in urine or stool Heavy or prolonged menstrual bleeding Vomiting blood or coughing up blood Unusual or severe headaches (possible sign of internal bleeding) Dizziness or weakness  Please reports these symptoms immediately.     Please continue to a heart-healthy diet and increase your physical activities. Try to exercise for at least five days a week.    It was a pleasure to see you and I look forward to continuing to work together on your health and well-being. Please do not hesitate to call the office if you need care or have questions about your care.  In case of emergency, please visit the Emergency Department for urgent care, or contact our clinic at [phone] to schedule an appointment. We're here to help you!   Have a wonderful day and week. With Gratitude, Gilmore Laroche MSN, FNP-BC

## 2023-12-03 NOTE — Assessment & Plan Note (Addendum)
Encouraged to take phenazopyridine 200 mg TID for 2 days

## 2023-12-03 NOTE — Assessment & Plan Note (Addendum)
[name] urinalysis in the clinic is positive for nitrates. We will initiate therapy with Keflex 500 mg, to be taken twice daily for 3 days, and Pyridium 200 mg, to be taken three times daily for dysuria, which has been sent to her pharmacy. The patient was advised to wear panty liners during the treatment regimen, as the medication may cause discoloration of her urine. She was also encouraged to complete the full course of antibiotics and follow up with her urologist as scheduled.   To help prevent future urinary tract infections (UTIs), the patient was advised to avoid prolonged urination, promptly empty the bladder when feeling the urge, and empty the bladder after intercourse. Additionally, she was encouraged to shower instead of taking baths to minimize bacterial exposure, to wipe from front to back to prevent bacterial transfer, and to stay well-hydrated by drinking a full glass of water regularly to flush bacteria from her system.

## 2023-12-04 ENCOUNTER — Other Ambulatory Visit: Payer: Self-pay | Admitting: Family Medicine

## 2023-12-04 DIAGNOSIS — E038 Other specified hypothyroidism: Secondary | ICD-10-CM

## 2023-12-04 LAB — CMP14+EGFR
ALT: 81 [IU]/L — ABNORMAL HIGH (ref 0–32)
AST: 30 [IU]/L (ref 0–40)
Albumin: 4.4 g/dL (ref 3.9–4.9)
Alkaline Phosphatase: 94 [IU]/L (ref 44–121)
BUN/Creatinine Ratio: 16 (ref 12–28)
BUN: 14 mg/dL (ref 8–27)
Bilirubin Total: 0.2 mg/dL (ref 0.0–1.2)
CO2: 21 mmol/L (ref 20–29)
Calcium: 9.8 mg/dL (ref 8.7–10.3)
Chloride: 104 mmol/L (ref 96–106)
Creatinine, Ser: 0.85 mg/dL (ref 0.57–1.00)
Globulin, Total: 2.1 g/dL (ref 1.5–4.5)
Glucose: 119 mg/dL — ABNORMAL HIGH (ref 70–99)
Potassium: 3.9 mmol/L (ref 3.5–5.2)
Sodium: 141 mmol/L (ref 134–144)
Total Protein: 6.5 g/dL (ref 6.0–8.5)
eGFR: 77 mL/min/{1.73_m2} (ref >59)

## 2023-12-04 LAB — LIPID PANEL
Chol/HDL Ratio: 3.2 {ratio} (ref 0.0–4.4)
Cholesterol, Total: 148 mg/dL (ref 100–199)
HDL: 46 mg/dL (ref >39)
LDL Chol Calc (NIH): 72 mg/dL (ref 0–99)
Triglycerides: 178 mg/dL — ABNORMAL HIGH (ref 0–149)
VLDL Cholesterol Cal: 30 mg/dL (ref 5–40)

## 2023-12-07 ENCOUNTER — Ambulatory Visit: Payer: Medicare Other | Admitting: Student in an Organized Health Care Education/Training Program

## 2023-12-07 ENCOUNTER — Telehealth: Payer: Self-pay

## 2023-12-07 LAB — URINALYSIS
Leukocytes,UA: NEGATIVE
Nitrite, UA: POSITIVE — AB
RBC, UA: NEGATIVE
Specific Gravity, UA: 1.025 (ref 1.005–1.030)
Urobilinogen, Ur: 2 mg/dL — ABNORMAL HIGH (ref 0.2–1.0)
pH, UA: 5.5 (ref 5.0–7.5)

## 2023-12-07 NOTE — Telephone Encounter (Signed)
Please schedule patient for 3 appts.with Dr. Romeo Apple for gel injections.  All information has been added to patient's chart under the referrals tab.  Thank you.

## 2023-12-08 DIAGNOSIS — Z6832 Body mass index (BMI) 32.0-32.9, adult: Secondary | ICD-10-CM | POA: Diagnosis not present

## 2023-12-08 DIAGNOSIS — M791 Myalgia, unspecified site: Secondary | ICD-10-CM | POA: Diagnosis not present

## 2023-12-08 DIAGNOSIS — R1084 Generalized abdominal pain: Secondary | ICD-10-CM | POA: Diagnosis not present

## 2023-12-08 DIAGNOSIS — R030 Elevated blood-pressure reading, without diagnosis of hypertension: Secondary | ICD-10-CM | POA: Diagnosis not present

## 2023-12-08 DIAGNOSIS — E6609 Other obesity due to excess calories: Secondary | ICD-10-CM | POA: Diagnosis not present

## 2023-12-09 ENCOUNTER — Encounter: Payer: Medicare Other | Admitting: Family Medicine

## 2023-12-14 ENCOUNTER — Inpatient Hospital Stay: Payer: Medicare Other | Attending: Hematology

## 2023-12-14 ENCOUNTER — Other Ambulatory Visit: Payer: Self-pay | Admitting: Physician Assistant

## 2023-12-14 ENCOUNTER — Encounter: Payer: Self-pay | Admitting: Neurology

## 2023-12-14 ENCOUNTER — Encounter: Payer: Self-pay | Admitting: Physician Assistant

## 2023-12-14 ENCOUNTER — Other Ambulatory Visit: Payer: Self-pay | Admitting: Family Medicine

## 2023-12-14 DIAGNOSIS — Z86711 Personal history of pulmonary embolism: Secondary | ICD-10-CM

## 2023-12-14 DIAGNOSIS — E538 Deficiency of other specified B group vitamins: Secondary | ICD-10-CM

## 2023-12-14 DIAGNOSIS — Z79899 Other long term (current) drug therapy: Secondary | ICD-10-CM | POA: Insufficient documentation

## 2023-12-14 DIAGNOSIS — D509 Iron deficiency anemia, unspecified: Secondary | ICD-10-CM | POA: Insufficient documentation

## 2023-12-14 LAB — CBC WITH DIFFERENTIAL/PLATELET
Abs Immature Granulocytes: 0.04 10*3/uL (ref 0.00–0.07)
Basophils Absolute: 0.1 10*3/uL (ref 0.0–0.1)
Basophils Relative: 1 %
Eosinophils Absolute: 0.1 10*3/uL (ref 0.0–0.5)
Eosinophils Relative: 1 %
HCT: 42.5 % (ref 36.0–46.0)
Hemoglobin: 14 g/dL (ref 12.0–15.0)
Immature Granulocytes: 1 %
Lymphocytes Relative: 33 %
Lymphs Abs: 2.9 10*3/uL (ref 0.7–4.0)
MCH: 29.9 pg (ref 26.0–34.0)
MCHC: 32.9 g/dL (ref 30.0–36.0)
MCV: 90.8 fL (ref 80.0–100.0)
Monocytes Absolute: 0.5 10*3/uL (ref 0.1–1.0)
Monocytes Relative: 6 %
Neutro Abs: 5.2 10*3/uL (ref 1.7–7.7)
Neutrophils Relative %: 58 %
Platelets: 277 10*3/uL (ref 150–400)
RBC: 4.68 MIL/uL (ref 3.87–5.11)
RDW: 13.1 % (ref 11.5–15.5)
WBC: 8.8 10*3/uL (ref 4.0–10.5)
nRBC: 0 % (ref 0.0–0.2)

## 2023-12-14 LAB — IRON AND TIBC
Iron: 129 ug/dL (ref 28–170)
Saturation Ratios: 28 % (ref 10.4–31.8)
TIBC: 461 ug/dL — ABNORMAL HIGH (ref 250–450)
UIBC: 332 ug/dL

## 2023-12-14 LAB — FERRITIN: Ferritin: 113 ng/mL (ref 11–307)

## 2023-12-15 ENCOUNTER — Ambulatory Visit: Payer: Medicare Other | Admitting: Vascular Surgery

## 2023-12-16 ENCOUNTER — Other Ambulatory Visit: Payer: Self-pay

## 2023-12-16 ENCOUNTER — Telehealth: Payer: Self-pay

## 2023-12-16 ENCOUNTER — Other Ambulatory Visit: Payer: Self-pay | Admitting: Family Medicine

## 2023-12-16 DIAGNOSIS — G894 Chronic pain syndrome: Secondary | ICD-10-CM | POA: Diagnosis not present

## 2023-12-16 DIAGNOSIS — Z86711 Personal history of pulmonary embolism: Secondary | ICD-10-CM

## 2023-12-16 DIAGNOSIS — I480 Paroxysmal atrial fibrillation: Secondary | ICD-10-CM | POA: Diagnosis not present

## 2023-12-16 MED ORDER — XARELTO 20 MG PO TABS: ORAL_TABLET | ORAL | 3 refills | Status: DC

## 2023-12-16 NOTE — Telephone Encounter (Signed)
Refills sent

## 2023-12-16 NOTE — Telephone Encounter (Signed)
Copied from CRM [id number]. Topic: Clinical - Medication Refill >> Dec 16, 2023 10:46 AM Desma Mcgregor wrote: Most Recent Primary Care Visit:  Provider: Gilmore Laroche  Department: [facility]  Visit Type: OFFICE VISIT  Date: 12/03/2023  Medication: XARELTO 20 MG TABS tablet  Has the patient contacted their pharmacy? Yes (Agent: If no, request that the patient contact the pharmacy for the refill. If patient does not wish to contact the pharmacy document the reason why and proceed with request.) (Agent: If yes, when and what did the pharmacy advise?) [place] Spaniel that they sent the request a few days ago, but not seeing it pending  Is this the correct pharmacy for this prescription? Yes If no, delete pharmacy and type the correct one.  This is the patient's preferred pharmacy:  [facility] [phone] - [place], [place] - [address] AT [facility] [address] & [address] [place] [address] Phone: [phone] Fax: [phone]  Has the prescription been filled recently? Yes  Is the patient out of the medication? Yes  Has the patient been seen for an appointment in the last year OR does the patient have an upcoming appointment? Yes  Can we respond through MyChart? Yes  Agent: Please be advised that Rx refills may take up to 3 business days. We ask that you follow-up with your pharmacy.

## 2023-12-16 NOTE — Telephone Encounter (Signed)
Ok to refill ?    Copied from CRM [id number]. Topic: Clinical - Medication Question >> Dec 16, 2023 10:51 AM Desma Mcgregor wrote: Reason for CRM: Patient is out of XARELTO 20 MG TABS tablet. Order is currently pending. Patient asking if this Rx can be sent over to the pharmacy today.

## 2023-12-20 ENCOUNTER — Ambulatory Visit: Payer: Medicare Other | Admitting: Internal Medicine

## 2023-12-20 ENCOUNTER — Ambulatory Visit: Payer: Medicare Other | Admitting: Orthopedic Surgery

## 2023-12-20 VITALS — Ht 64.0 in | Wt 193.0 lb

## 2023-12-20 DIAGNOSIS — M170 Bilateral primary osteoarthritis of knee: Secondary | ICD-10-CM

## 2023-12-20 DIAGNOSIS — G8929 Other chronic pain: Secondary | ICD-10-CM

## 2023-12-20 MED ORDER — HYALURONAN 30 MG/2ML IX SOSY
30.0000 mg | PREFILLED_SYRINGE | INTRA_ARTICULAR | Status: AC
Start: 2023-12-20 — End: 2024-01-03
  Administered 2023-12-20 – 2024-01-03 (×2): 30 mg via INTRA_ARTICULAR

## 2023-12-20 NOTE — Progress Notes (Signed)
Chief Complaint  Patient presents with   Injections    Bil knee injection      Procedure note for injection   Chief Complaint  Patient presents with   Injections    Bil knee injection      Encounter Diagnoses  Name Primary?   Primary osteoarthritis of both knees Yes   Bilateral chronic knee pain        Procedure note for injection of hyaluronic acid   Diagnosis osteoarthritis of the knee  Verbal consent was obtained to inject the knee with HYALURONIC ACID . Timeout was completed to confirm the injection site as the RIGHT Knee  Ethyl chloride spray was used for anesthesia Alcohol was used to prep the skin. The infrapatellar lateral portal was used as an injection site and 1 vial of hyaluronic acid  was injected into the knee  Specific Co. Preparation: ORTHOVISC  No complications were noted   Procedure note for injection of hyaluronic acid   Diagnosis osteoarthritis of the knee  Verbal consent was obtained to inject the knee with HYALURONIC ACID . Timeout was completed to confirm the injection site as the LEFT Knee  Ethyl chloride spray was used for anesthesia Alcohol was used to prep the skin. The infrapatellar lateral portal was used as an injection site and 1 vial of hyaluronic acid  was injected into the knee  Specific Co. Preparation: ORTHOVISC   No complications were noted

## 2023-12-27 ENCOUNTER — Ambulatory Visit: Payer: Medicare Other | Admitting: Orthopedic Surgery

## 2023-12-27 DIAGNOSIS — G8929 Other chronic pain: Secondary | ICD-10-CM

## 2023-12-27 DIAGNOSIS — M170 Bilateral primary osteoarthritis of knee: Secondary | ICD-10-CM

## 2023-12-27 MED ORDER — HYALURONAN 30 MG/2ML IX SOSY
15.0000 mg | PREFILLED_SYRINGE | Freq: Once | INTRA_ARTICULAR | Status: AC
Start: 2023-12-27 — End: 2023-12-27
  Administered 2023-12-27: 15 mg via INTRA_ARTICULAR

## 2023-12-27 NOTE — Addendum Note (Signed)
Addended by: Jodene Nam A on: 12/27/2023 05:06 PM   Modules accepted: Orders

## 2023-12-27 NOTE — Progress Notes (Signed)
Chief Complaint  Patient presents with   Knee Pain    Bilateral knee/ OrthoVisc #2    Encounter Diagnoses  Name Primary?   Bilateral chronic knee pain Yes   Primary osteoarthritis of both knees    Patient: [name]           Date of Birth: Dec 11, 1961           MRN: [id number] Visit Date: 12/27/2023 Requested by: Kerri Perches, MD [address] [place],  [place] [address] PCP: Kerri Perches, MD  Chief Complaint  Patient presents with   Knee Pain    Bilateral knee/ OrthoVisc #2    Encounter Diagnoses  Name Primary?   Bilateral chronic knee pain Yes   Primary osteoarthritis of both knees    Procedure note for injection of hyaluronic acid   Diagnosis osteoarthritis of the knee  Verbal consent was obtained to inject the knee with HYALURONIC ACID . Timeout was completed to confirm the injection site as the left    Knee  Ethyl chloride spray was used for anesthesia Alcohol was used to prep the skin. The infrapatellar lateral portal was used as an injection site and 1 vial of hyaluronic acid  was injected into the knee  Specific Co. Preparation: orthovisc  No complications were noted    The patient has consented to and requested hyaluronic acid injection   MEDICATION: orthovisc  right  THE knee is prepped with alcohol and ethyl chloride  The injection is performed with a 21-gauge needle, via inferolateral approach  No complications were noted  Appropriate instructions post injection were given

## 2023-12-30 ENCOUNTER — Telehealth: Payer: Self-pay

## 2023-12-30 ENCOUNTER — Other Ambulatory Visit: Payer: Self-pay

## 2023-12-30 MED ORDER — DULOXETINE HCL 60 MG PO CPEP: 60.0000 mg | ORAL_CAPSULE | Freq: Every day | ORAL | 0 refills | Status: DC

## 2023-12-30 NOTE — Telephone Encounter (Signed)
 Copied from CRM [id number]. Topic: Clinical - Medication Refill >> Dec 30, 2023  1:04 PM Kinnie DEL wrote: Most Recent Primary Care Visit:  Provider: MAREN DAS  Department: [facility]  Visit Type: OFFICE VISIT  Date: 12/03/2023  Medication: cyclobenzaprine  (FLEXERIL ) 10 MG tablet DULoxetine  (CYMBALTA ) 60 MG capsule ALPRAZolam  (XANAX ) 1 MG tablet  Has the patient contacted their pharmacy? Yes (Agent: If no, request that the patient contact the pharmacy for the refill. If patient does not wish to contact the pharmacy document the reason why and proceed with request.) (Agent: If yes, when and what did the pharmacy advise?)  Is this the correct pharmacy for this prescription? Yes If no, delete pharmacy and type the correct one.  This is the patient's preferred pharmacy:  [facility] [phone] - [place], [place] - [address] AT [facility] [address] [place] [address] Phone: [phone] Fax: [phone]   Has the prescription been filled recently? No  Is the patient out of the medication? Yes  Has the patient been seen for an appointment in the last year OR does the patient have an upcoming appointment? Yes  Can we respond through MyChart? No  Agent: Please be advised that Rx refills may take up to 3 business days. We ask that you follow-up with your pharmacy.

## 2023-12-31 MED ORDER — ALPRAZOLAM 1 MG PO TABS: 1.0000 mg | ORAL_TABLET | Freq: Two times a day (BID) | ORAL | 0 refills | Status: DC | PRN

## 2023-12-31 NOTE — Addendum Note (Signed)
 Addended by: Kerri Perches on: 12/31/2023 10:39 AM   Modules accepted: Orders

## 2024-01-03 ENCOUNTER — Other Ambulatory Visit: Payer: Self-pay | Admitting: Family Medicine

## 2024-01-03 ENCOUNTER — Ambulatory Visit: Payer: Medicare Other | Admitting: Orthopedic Surgery

## 2024-01-03 VITALS — BP 108/71 | HR 108

## 2024-01-03 DIAGNOSIS — M170 Bilateral primary osteoarthritis of knee: Secondary | ICD-10-CM

## 2024-01-03 DIAGNOSIS — G8929 Other chronic pain: Secondary | ICD-10-CM

## 2024-01-03 NOTE — Progress Notes (Signed)
 Complete physical exam  Patient: [name]   DOB: 1961/12/26   63 y.o. Female  MRN: [id number]  Subjective:    Chief Complaint  Patient presents with   Annual Exam    CPE    [name] is a 63 y.o. female who presents today for a complete physical exam. She reports consuming a  Low carb, high protein with vegetables   diet. Patient has incorporated regular exercise into their routine, using a stepper for 15 minutes daily and engaging in 3-4 one-hour walks per week She generally feels well. She reports sleeping poorly sleeping 5-6 hours per night. She does have additional problems to discuss today.    Most recent fall risk assessment:    01/04/2024    3:34 PM  Fall Risk   Falls in the past year? 0  Number falls in past yr: 0  Injury with Fall? 0  Risk for fall due to : No Fall Risks  Follow up Falls evaluation completed     Most recent depression screenings:    12/03/2023    8:34 AM 10/20/2023   11:09 AM  PHQ 2/9 Scores  PHQ - 2 Score 0 0  PHQ- 9 Score 6 0    Vision:Within last year and Dental: No current dental problems and Receives regular dental care  Patient Care Team: Antonetta Rollene BRAVO, MD as PCP - General [place] LABOR, MD as PCP - Cardiology (Cardiology) Fernande Elspeth BROCKS, MD as PCP - Electrophysiology (Cardiology) Duayne Hummer (Inactive) as Referring Physician Ray, Aureliano Cookey, MD (Anesthesiology) Mai Lynwood FALCON, MD as Consulting Physician (Rheumatology) Ray, Aureliano Cookey, MD as Referring Physician (Anesthesiology) Sherre Glatter, MD as Referring Physician (Cardiothoracic Surgery)   Outpatient Medications Prior to Visit  Medication Sig   aspirin  81 MG chewable tablet Chew 81 mg by mouth daily.   butalbital -acetaminophen -caffeine  (FIORICET) 50-325-40 MG tablet Take 1 tablet by mouth every 6 (six) hours as needed for headache.   Cholecalciferol (VITAMIN D3) 1.25 MG (50000 UT) CAPS Take 1 capsule by mouth every 30 (thirty) days.    cyanocobalamin  (VITAMIN B12) 1000 MCG/ML injection ADMINISTER 1 ML(1000 MCG) IN THE MUSCLE EVERY 30 DAYS   cycloSPORINE  (RESTASIS ) 0.05 % ophthalmic emulsion Place 1 drop into both eyes 2 (two) times daily as needed (dry eyes).   diazepam  (VALIUM ) 10 MG tablet Dampen and place 1 tablet vaginally at bedtime every other day.   erythromycin  (ERY-TAB ) 250 MG EC tablet TAKE 1 TABLET BY MOUTH THREE TIMES DAILY BEFORE MEALS (Patient taking differently: Take 250 mg by mouth 3 (three) times daily.)   ezetimibe  (ZETIA ) 10 MG tablet TAKE 1 TABLET(10 MG) BY MOUTH DAILY   fenofibrate  (TRICOR ) 48 MG tablet Take 1 tablet (48 mg total) by mouth daily.   furosemide  (LASIX ) 20 MG tablet TAKE 1 TABLET(20 MG) BY MOUTH DAILY   HYDROmorphone  (DILAUDID ) 4 MG tablet Take 4 mg by mouth 3 (three) times daily.   levothyroxine  (SYNTHROID ) 125 MCG tablet TAKE 1 TABLET(125 MCG) BY MOUTH DAILY BEFORE BREAKFAST   lubiprostone  (AMITIZA ) 24 MCG capsule TAKE 1 CAPSULE(24 MCG) BY MOUTH TWICE DAILY WITH A MEAL   magnesium  oxide (MAG-OX) 400 (240 Mg) MG tablet TAKE 1 TABLET BY MOUTH FOUR TIMES DAILY (Patient taking differently: Take 400 mg by mouth 2 (two) times daily.)   MegaRed Omega-3 Krill Oil 500 MG CAPS Take 500 mg by mouth at bedtime.   Melatonin 10 MG TABS Take 10 mg by mouth at bedtime as needed (  Sleep).   metFORMIN  (GLUCOPHAGE -XR) 500 MG 24 hr tablet Take 500 mg by mouth 2 (two) times daily with a meal.   metoprolol  succinate (TOPROL -XL) 25 MG 24 hr tablet TAKE 1 TABLET(25 MG) BY MOUTH DAILY (Patient taking differently: Take 25 mg by mouth 2 (two) times daily.)   Multiple Vitamins-Minerals (ICAPS AREDS 2 PO) Take 1 capsule by mouth at bedtime.   naloxone (NARCAN) nasal spray 4 mg/0.1 mL Place 1 spray into the nose as needed (opioid reversal).   omeprazole  (PRILOSEC) 40 MG capsule Take 1 capsule (40 mg total) by mouth in the morning and at bedtime.   OVER THE COUNTER MEDICATION Take 2 tablets by mouth 2 (two) times daily.  Nutra-ful   phenazopyridine  (PYRIDIUM ) 95 MG tablet Take by mouth 3 (three) times daily as needed. sd   Polyethyl Glycol-Propyl Glycol (SYSTANE ULTRA) 0.4-0.3 % SOLN Place 1 drop into both eyes daily.   polyethylene glycol (MIRALAX  / GLYCOLAX ) 17 g packet Take 17 g by mouth daily as needed for mild constipation or moderate constipation.   potassium chloride  (KLOR-CON  M) 10 MEQ tablet Take 1 tablet (10 mEq total) by mouth 2 (two) times daily.   pravastatin  (PRAVACHOL ) 20 MG tablet TAKE 1 TABLET(20 MG) BY MOUTH DAILY   promethazine  (PHENERGAN ) 12.5 MG tablet Take 12.5 mg by mouth every 6 (six) hours as needed for nausea or vomiting.   sennosides-docusate sodium  (SENOKOT-S) 8.6-50 MG tablet Take 2 tablets by mouth 3 (three) times daily.   XARELTO  20 MG TABS tablet TAKE 1 TABLET(20 MG) BY MOUTH DAILY   [DISCONTINUED] ALPRAZolam  (XANAX ) 1 MG tablet Take 1 tablet (1 mg total) by mouth 2 (two) times daily as needed for anxiety.   [DISCONTINUED] cyclobenzaprine  (FLEXERIL ) 10 MG tablet Take 1 tablet (10 mg total) by mouth 2 (two) times daily as needed for muscle spasms. (Patient taking differently: Take 10 mg by mouth 2 (two) times daily.)   [DISCONTINUED] DULoxetine  (CYMBALTA ) 60 MG capsule Take 1 capsule (60 mg total) by mouth daily.   [DISCONTINUED] PREMARIN  vaginal cream INSERT 1 APPLICATORFUL VAGINALLY DAILY   No facility-administered medications prior to visit.    Review of Systems  Constitutional:  Negative for chills and fever.  Eyes:  Negative for blurred vision.  Respiratory:  Negative for shortness of breath.   Cardiovascular:  Negative for claudication and leg swelling.  Gastrointestinal:  Negative for abdominal pain.  Genitourinary:  Positive for dysuria, flank pain and urgency.  Musculoskeletal:  Positive for back pain, joint pain and myalgias.  Skin:  Negative for rash.  Neurological:  Negative for dizziness.       Objective:    BP 107/79   Pulse 91   Ht 5' 4 (1.626 m)   Wt  187 lb (84.8 kg)   SpO2 94%   BMI 32.10 kg/m  BP Readings from Last 3 Encounters:  01/04/24 107/79  01/03/24 108/71  12/03/23 138/86      Physical Exam Vitals reviewed.  Constitutional:      General: She is not in acute distress.    Appearance: Normal appearance. She is not ill-appearing, toxic-appearing or diaphoretic.  HENT:     Head: Normocephalic.     Right Ear: Tympanic membrane normal.     Left Ear: Tympanic membrane normal.     Mouth/Throat:     Mouth: Mucous membranes are moist.  Eyes:     General:        Right eye: No discharge.  Left eye: No discharge.     Conjunctiva/sclera: Conjunctivae normal.     Pupils: Pupils are equal, round, and reactive to light.  Cardiovascular:     Rate and Rhythm: Normal rate.     Pulses: Normal pulses.  Pulmonary:     Effort: Pulmonary effort is normal. No respiratory distress.  Abdominal:     General: Bowel sounds are normal.     Palpations: Abdomen is soft.     Tenderness: There is no abdominal tenderness. There is right CVA tenderness. There is no left CVA tenderness or guarding.  Musculoskeletal:     Cervical back: Pain with movement present. Decreased range of motion.     Lumbar back: Tenderness present. Decreased range of motion. Positive right straight leg raise test and positive left straight leg raise test.  Skin:    General: Skin is warm and dry.     Capillary Refill: Capillary refill takes less than 2 seconds.  Neurological:     Mental Status: She is alert.     Coordination: Coordination normal.     Gait: Gait normal.  Psychiatric:        Mood and Affect: Mood normal.        Behavior: Behavior normal.      No results found for any visits on 01/04/24.    Assessment & Plan:    Routine Health Maintenance and Physical Exam  Immunization History  Administered Date(s) Administered   Hep B, Unspecified 06/27/2009, 08/06/2009, 12/26/2009   Hepatitis A 06/27/2009, 12/26/2009   Hepatitis A, Adult 06/27/2009,  12/26/2009   Hepatitis B 06/27/2009, 08/06/2009, 12/26/2009   Hepatitis B, PED/ADOLESCENT 06/27/2009, 08/06/2009, 12/26/2009   Influenza Split 09/15/2012   Influenza Whole 09/13/2007, 10/01/2008, 09/09/2010, 09/23/2011   Influenza, Mdck, Trivalent,PF 6+ MOS(egg free) 09/10/2023   Influenza, Seasonal, Injecte, Preservative Fre 10/08/2014   Influenza,inj,Quad PF,6+ Mos 10/25/2013, 09/23/2015, 09/18/2016, 10/11/2017, 08/25/2018, 10/17/2019, 10/05/2020, 09/19/2021, 09/24/2022   Influenza-Unspecified 09/15/2012, 09/27/2013   Moderna Covid-19 Vaccine Bivalent Booster 66yrs & up 07/31/2022, 11/25/2022   Moderna Sars-Covid-2 Vaccination 03/18/2020, 04/22/2020, 11/04/2020   Pneumococcal Polysaccharide-23 09/23/2011   Rsv, Bivalent, Protein Subunit Rsvpref,pf Marlow) 11/25/2022   Td 10/27/2005   Tdap 12/22/2016   Zoster Recombinant(Shingrix) 05/15/2021, 08/11/2021    Health Maintenance  Topic Date Due   COVID-19 Vaccine (6 - 2024-25 season) 01/20/2024 (Originally 08/29/2023)   MAMMOGRAM  07/05/2024   Medicare Annual Wellness (AWV)  10/19/2024   DTaP/Tdap/Td (3 - Td or Tdap) 12/22/2026   Colonoscopy  05/03/2031   INFLUENZA VACCINE  Completed   Hepatitis C Screening  Completed   HIV Screening  Completed   Zoster Vaccines- Shingrix  Completed   HPV VACCINES  Aged Out    Discussed health benefits of physical activity, and encouraged her to engage in regular exercise appropriate for her age and condition.  Encounter for routine adult physical exam with abnormal findings Assessment & Plan: A comprehensive physical examination was completed, and necessary labs were ordered. Screening and health maintenance recommendations have been updated. The patient received counseling on exercise and nutrition. BMI was assessed and discussed Advise for heart health, focus on: Eat more fruits and vegetables: Aim for a variety of colors. Choose whole grains: Brown rice, oats, and whole-wheat bread. Limit  unhealthy fats: Avoid trans fats; use olive or avocado oil instead. Include lean proteins: Opt for fish, chicken, beans, and legumes. Reduce sodium: Limit processed foods and add less salt. Stay hydrated: Drink plenty of water . Exercise regularly: Aim for at least 30  minutes of moderate exercise, like walking or cycling, 5 days a week.     TSH (thyroid -stimulating hormone deficiency) -     TSH + free T4  IFG (impaired fasting glucose) -     Hemoglobin A1c  Dysuria Assessment & Plan: Urinalysis, culture ordered- Awaiting results will follow up. May take OTC AZO for urinary pain relief. Discussed Avoid irritants like caffeine , alcohol, spicy foods, and artificial sweeteners, as they can aggravate your bladder. Wearing loose, breathable clothing, especially cotton underwear, can help keep the area dry and reduce bacterial growth. If symptoms persist or worsen follow up.   Orders: -     Urinalysis -     Urine Culture  Spinal stenosis of lumbar region, unspecified whether neurogenic claudication present -     methylPREDNISolone  Acetate -     methylPREDNISolone  Acetate  Spinal stenosis of lumbar region with radiculopathy Assessment & Plan: Depo-Medrol  40 mg IM injection given today.   Other orders -     Cyclobenzaprine  HCl; Take 1 tablet (10 mg total) by mouth 2 (two) times daily as needed for muscle spasms.  Dispense: 90 tablet; Refill: 3 -     Premarin ; INSERT 1 APPLICATORFUL VAGINALLY DAILY  Dispense: 30 g; Refill: 2 -     DULoxetine  HCl; Take 1 capsule (60 mg total) by mouth daily.  Dispense: 30 capsule; Refill: 3    Return in about 4 months (around 05/03/2024), or if symptoms worsen or fail to improve, for chronic follow-up with PCP Dr. Antonetta.     Hilario Kidd Wilhelmena Falter, FNP

## 2024-01-03 NOTE — Patient Instructions (Signed)

## 2024-01-03 NOTE — Progress Notes (Signed)
   BP 108/71   Pulse (!) 108   There is no height or weight on file to calculate BMI.  Chief Complaint  Patient presents with   Injections    Orthovisc -Bilateral    Encounter Diagnoses  Name Primary?   Primary osteoarthritis of both knees Yes   Bilateral chronic knee pain     Improved Bilateral Orthovisc injections-

## 2024-01-03 NOTE — Progress Notes (Signed)
 Patient: [name]           Date of Birth: 09-06-1961           MRN: [id number] Visit Date: 01/03/2024 Requested by: Antonetta Rollene BRAVO, MD [address], [address] [place],  [place] [address] PCP: Antonetta Rollene BRAVO, MD  Chief Complaint  Patient presents with   Injections    Orthovisc -Bilateral    Encounter Diagnoses  Name Primary?   Primary osteoarthritis of both knees Yes   Bilateral chronic knee pain     The patient has consented to and requested hyaluronic acid injection     left  THE knee is prepped with alcohol and ethyl chloride  The injection is performed with a 21-gauge needle, via inferolateral approach  No complications were noted  Appropriate instructions post injection were given    Patient: [name]           Date of Birth: [birth year]           MRN: [id number] Visit Date: 01/03/2024 Requested by: Antonetta Rollene BRAVO, MD [address], [address] [place],  [place] [address] PCP: Antonetta Rollene BRAVO, MD  Chief Complaint  Patient presents with   Injections    Orthovisc -Bilateral    Encounter Diagnoses  Name Primary?   Primary osteoarthritis of both knees Yes   Bilateral chronic knee pain     The patient has consented to and requested hyaluronic acid injection   Right   THE knee is prepped with alcohol and ethyl chloride  The injection is performed with a 21-gauge needle, via inferolateral approach  No complications were noted  Appropriate instructions post injection were given   Return prn

## 2024-01-04 ENCOUNTER — Encounter: Payer: Self-pay | Admitting: Family Medicine

## 2024-01-04 ENCOUNTER — Ambulatory Visit (INDEPENDENT_AMBULATORY_CARE_PROVIDER_SITE_OTHER): Payer: Medicare Other | Admitting: Family Medicine

## 2024-01-04 VITALS — BP 107/79 | HR 91 | Ht 64.0 in | Wt 187.0 lb

## 2024-01-04 DIAGNOSIS — R300 Dysuria: Secondary | ICD-10-CM | POA: Diagnosis not present

## 2024-01-04 DIAGNOSIS — Z0001 Encounter for general adult medical examination with abnormal findings: Secondary | ICD-10-CM

## 2024-01-04 DIAGNOSIS — M5416 Radiculopathy, lumbar region: Secondary | ICD-10-CM

## 2024-01-04 DIAGNOSIS — M48061 Spinal stenosis, lumbar region without neurogenic claudication: Secondary | ICD-10-CM | POA: Diagnosis not present

## 2024-01-04 DIAGNOSIS — E038 Other specified hypothyroidism: Secondary | ICD-10-CM

## 2024-01-04 DIAGNOSIS — R7301 Impaired fasting glucose: Secondary | ICD-10-CM | POA: Diagnosis not present

## 2024-01-04 MED ORDER — CYCLOBENZAPRINE HCL 10 MG PO TABS: 10.0000 mg | ORAL_TABLET | Freq: Two times a day (BID) | ORAL | 3 refills | Status: DC | PRN

## 2024-01-04 MED ORDER — METHYLPREDNISOLONE ACETATE 40 MG/ML IJ SUSP: 40.0000 mg | Freq: Once | INTRAMUSCULAR | Status: DC

## 2024-01-04 MED ORDER — METHYLPREDNISOLONE ACETATE 80 MG/ML IJ SUSP
40.0000 mg | Freq: Once | INTRAMUSCULAR | Status: AC
  Administered 2024-01-04: 40 mg via INTRAMUSCULAR

## 2024-01-04 MED ORDER — DULOXETINE HCL 60 MG PO CPEP: 60.0000 mg | ORAL_CAPSULE | Freq: Every day | ORAL | 3 refills | Status: DC

## 2024-01-04 MED ORDER — PREMARIN 0.625 MG/GM VA CREA: TOPICAL_CREAM | VAGINAL | 2 refills | Status: DC

## 2024-01-04 NOTE — Assessment & Plan Note (Signed)
 A comprehensive physical examination was completed, and necessary labs were ordered. Screening and health maintenance recommendations have been updated. The patient received counseling on exercise and nutrition. BMI was assessed and discussed Advise for heart health, focus on: Eat more fruits and vegetables: Aim for a variety of colors. Choose whole grains: Brown rice, oats, and whole-wheat bread. Limit unhealthy fats: Avoid trans fats; use olive or avocado oil instead. Include lean proteins: Opt for fish, chicken, beans, and legumes. Reduce sodium: Limit processed foods and add less salt. Stay hydrated: Drink plenty of water. Exercise regularly: Aim for at least 30 minutes of moderate exercise, like walking or cycling, 5 days a week.

## 2024-01-04 NOTE — Assessment & Plan Note (Signed)
 Depo-Medrol 40 mg IM injection given today.

## 2024-01-04 NOTE — Assessment & Plan Note (Signed)
 Urinalysis, culture ordered- Awaiting results will follow up. May take OTC AZO for urinary pain relief. Discussed Avoid irritants like caffeine , alcohol, spicy foods, and artificial sweeteners, as they can aggravate your bladder. Wearing loose, breathable clothing, especially cotton underwear, can help keep the area dry and reduce bacterial growth. If symptoms persist or worsen follow up.

## 2024-01-05 LAB — MICROSCOPIC EXAMINATION

## 2024-01-06 LAB — TSH+FREE T4
Free T4: 1.79 ng/dL — ABNORMAL HIGH (ref 0.82–1.77)
TSH: 1.1 u[IU]/mL (ref 0.450–4.500)

## 2024-01-06 LAB — MICROSCOPIC EXAMINATION

## 2024-01-06 LAB — URINALYSIS, ROUTINE W REFLEX MICROSCOPIC
Glucose, UA: NEGATIVE
Nitrite, UA: NEGATIVE
RBC, UA: NEGATIVE
Specific Gravity, UA: >=1.03 — AB (ref 1.005–1.030)
Urobilinogen, Ur: 1 mg/dL (ref 0.2–1.0)
pH, UA: 5.5 (ref 5.0–7.5)

## 2024-01-06 LAB — URINE CULTURE

## 2024-01-06 LAB — HEMOGLOBIN A1C
Est. average glucose Bld gHb Est-mCnc: 111 mg/dL
Hgb A1c MFr Bld: 5.5 % (ref 4.8–5.6)

## 2024-01-07 ENCOUNTER — Telehealth: Payer: Self-pay

## 2024-01-07 NOTE — Telephone Encounter (Signed)
 Patient called to set up surgery with Dr. Annabell Howells, I informed her that I would confirm where she will be having her procedure done and reach back out.

## 2024-01-12 ENCOUNTER — Other Ambulatory Visit: Payer: Self-pay | Admitting: Family Medicine

## 2024-01-12 NOTE — Telephone Encounter (Signed)
 I checked with her, she does not have a surgery posting on patient.

## 2024-01-13 ENCOUNTER — Other Ambulatory Visit: Payer: Medicare Other | Admitting: Urology

## 2024-01-13 NOTE — Telephone Encounter (Signed)
Left vm requesting a call back to confirm where she prefers to have surgery.

## 2024-01-17 ENCOUNTER — Other Ambulatory Visit: Payer: Self-pay

## 2024-01-17 DIAGNOSIS — N3582 Other urethral stricture, female: Secondary | ICD-10-CM

## 2024-01-17 NOTE — Telephone Encounter (Signed)
Patient decided to have surgery in [name].  Surgery date is 02/13 with Dr. Ronne Binning.  Will have patient follow up with you post surgery.

## 2024-01-18 ENCOUNTER — Ambulatory Visit
Payer: Medicare Other | Attending: Student in an Organized Health Care Education/Training Program | Admitting: Student in an Organized Health Care Education/Training Program

## 2024-01-18 ENCOUNTER — Telehealth: Payer: Self-pay | Admitting: Internal Medicine

## 2024-01-18 ENCOUNTER — Other Ambulatory Visit: Payer: Self-pay | Admitting: Family Medicine

## 2024-01-18 ENCOUNTER — Encounter: Payer: Self-pay | Admitting: Student in an Organized Health Care Education/Training Program

## 2024-01-18 VITALS — BP 97/73 | HR 82 | Temp 97.5°F | Resp 16 | Ht 64.5 in | Wt 187.0 lb

## 2024-01-18 DIAGNOSIS — Z981 Arthrodesis status: Secondary | ICD-10-CM | POA: Insufficient documentation

## 2024-01-18 DIAGNOSIS — M79601 Pain in right arm: Secondary | ICD-10-CM | POA: Diagnosis not present

## 2024-01-18 DIAGNOSIS — G540 Brachial plexus disorders: Secondary | ICD-10-CM | POA: Diagnosis not present

## 2024-01-18 DIAGNOSIS — M542 Cervicalgia: Secondary | ICD-10-CM | POA: Insufficient documentation

## 2024-01-18 DIAGNOSIS — M5412 Radiculopathy, cervical region: Secondary | ICD-10-CM | POA: Insufficient documentation

## 2024-01-18 DIAGNOSIS — Z9889 Other specified postprocedural states: Secondary | ICD-10-CM | POA: Insufficient documentation

## 2024-01-18 NOTE — Patient Instructions (Addendum)
Stop Xarelto 3 days prior  ______________________________________________________________________    Preparing for your procedure  Appointments: If you think you may not be able to keep your appointment, call 24-48 hours in advance to cancel. We need time to make it available to others.  Procedure visits are for procedures only. During your procedure appointment there will be: NO Prescription Refills*. NO medication changes or discussions*. NO discussion of disability issues*. NO unrelated pain problem evaluations*. NO evaluations to order other pain procedures*. *These will be addressed at a separate and distinct evaluation encounter on the provider's evaluation schedule and not during procedure days.  Instructions: Food intake: Avoid eating anything solid for at least 8 hours prior to your procedure. Clear liquid intake: You may take clear liquids such as water up to 2 hours prior to your procedure. (No carbonated drinks. No soda.) Transportation: Unless otherwise stated by your physician, bring a driver. (Driver cannot be a Market researcher, Pharmacist, community, or any other form of public transportation.) Morning Medicines: Except for blood thinners, take all of your other morning medications with a sip of water. Make sure to take your heart and blood pressure medicines. If your blood pressure's lower number is above 100, the case will be rescheduled. Blood thinners: Make sure to stop your blood thinners as instructed.  If you take a blood thinner, but were not instructed to stop it, call our office [phone] and ask to talk to a nurse. Not stopping a blood thinner prior to certain procedures could lead to serious complications. Diabetics on insulin: Notify the staff so that you can be scheduled 1st case in the morning. If your diabetes requires high dose insulin, take only  of your normal insulin dose the morning of the procedure and notify the staff that you have done so. Preventing infections: Shower with  an antibacterial soap the morning of your procedure.  Build-up your immune system: Take 1000 mg of Vitamin C with every meal (3 times a day) the day prior to your procedure. Antibiotics: Inform the nursing staff if you are taking any antibiotics or if you have any conditions that may require antibiotics prior to procedures. (Example: recent joint implants)   Pregnancy: If you are pregnant make sure to notify the nursing staff. Not doing so may result in injury to the fetus, including death.  Sickness: If you have a cold, fever, or any active infections, call and cancel or reschedule your procedure. Receiving steroids while having an infection may result in complications. Arrival: You must be in the facility at least 30 minutes prior to your scheduled procedure. Tardiness: Your scheduled time is also the cutoff time. If you do not arrive at least 15 minutes prior to your procedure, you will be rescheduled.  Children: Do not bring any children with you. Make arrangements to keep them home. Dress appropriately: There is always a possibility that your clothing may get soiled. Avoid long dresses. Valuables: Do not bring any jewelry or valuables.  Reasons to call and reschedule or cancel your procedure: (Following these recommendations will minimize the risk of a serious complication.) Surgeries: Avoid having procedures within 2 weeks of any surgery. (Avoid for 2 weeks before or after any surgery). Flu Shots: Avoid having procedures within 2 weeks of a flu shots or . (Avoid for 2 weeks before or after immunizations). Barium: Avoid having a procedure within 7-10 days after having had a radiological study involving the use of radiological contrast. (Myelograms, Barium swallow or enema study). Heart attacks: Avoid any  elective procedures or surgeries for the initial 6 months after a "Myocardial Infarction" (Heart Attack). Blood thinners: It is imperative that you stop these medications before procedures. Let  us know if you if you take any blood thinner.  Infection: Avoid procedures during or within two weeks of an infection (including chest colds or gastrointestinal problems). Symptoms associated with infections include: Localized redness, fever, chills, night sweats or profuse sweating, burning sensation when voiding, cough, congestion, stuffiness, runny nose, sore throat, diarrhea, nausea, vomiting, cold or Flu symptoms, recent or current infections. It is specially important if the infection is over the area that we intend to treat. Heart and lung problems: Symptoms that may suggest an active cardiopulmonary problem include: cough, chest pain, breathing difficulties or shortness of breath, dizziness, ankle swelling, uncontrolled high or unusually low blood pressure, and/or palpitations. If you are experiencing any of these symptoms, cancel your procedure and contact your primary care physician for an evaluation.  Remember:  Regular Business hours are:  Monday to Thursday 8:00 AM to 4:00 PM  Provider's Schedule: Delano Metz, MD:  Procedure days: Tuesday and Thursday 7:30 AM to 4:00 PM  Edward Jolly, MD:  Procedure days: Monday and Wednesday 7:30 AM to 4:00 PM Last  Updated: 12/07/2023 ______________________________________________________________________

## 2024-01-18 NOTE — Telephone Encounter (Signed)
   Name: [name]  DOB: Mar 11, 1961  MRN: [id number]  Primary Cardiologist: Christell Constant, MD  Chart reviewed as part of pre-operative protocol coverage. Because of [name]'s past medical history and time since last visit, she will require a follow-up in-office visit in order to better assess preoperative cardiovascular risk.  Pre-op covering staff: - Please schedule appointment and call patient to inform them. If patient already had an upcoming appointment within acceptable timeframe, please add "pre-op clearance" to the appointment notes so provider is aware. - Please contact requesting surgeon's office via preferred method (i.e, phone, fax) to inform them of need for appointment prior to surgery.    Barbara Grapes, NP  01/18/2024, 10:34 AM

## 2024-01-18 NOTE — Progress Notes (Signed)
Safety precautions to be maintained throughout the outpatient stay will include: orient to surroundings, keep bed in low position, maintain call bell within reach at all times, provide assistance with transfer out of bed and ambulation.  

## 2024-01-18 NOTE — Progress Notes (Signed)
 Marland Kitchen

## 2024-01-18 NOTE — Telephone Encounter (Signed)
Patient is taking Aspirin which was not indicated on the clearance request.

## 2024-01-18 NOTE — Telephone Encounter (Signed)
   Pre-operative Risk Assessment    Patient Name: [name]  DOB: October 31, 1961 MRN: [id number]   Date of last office visit: 10/04/2023 Date of next office visit: 01/24/2024   Request for Surgical Clearance    Procedure:   urethral dilation with hydrodistention  Date of Surgery:  Clearance 02/10/24                                Surgeon:  Dr. Ronne Binning Surgeon's Group or Practice Name:  [facility] [place] Phone number:  [phone] Fax number:  [phone]   Type of Clearance Requested:   - Medical    Type of Anesthesia:  General    Additional requests/questions:    SignedAdele, Tillison   01/18/2024, 10:18 AM

## 2024-01-18 NOTE — Progress Notes (Signed)
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 HYSTERECTOMY  1987   ANTERIOR CERVICAL DECOMP/DISCECTOMY FUSION  03-07-2002   dr elsner  @[facility]    C 4 -- 5   APPENDECTOMY  1980   AUGMENTATION MAMMAPLASTY Right 2006   BILATERAL SALPINGOOPHORECTOMY  07/24/2011   via Explor. Lap. w/ intraoperative perf. bowel repair   BIOPSY  05/02/2021   Procedure: BIOPSY;  Surgeon: Marguerita Merles, Reuel Boom, MD;  [facility];  Service: Gastroenterology;;  small bowel, mid esophagus, distal esophagus, random colon biopsies   BIOPSY  12/04/2021   Procedure: BIOPSY;  Surgeon: Lanelle Bal, DO;  [facility];  Service: Endoscopy;;   BIOPSY  03/31/2022   Procedure: BIOPSY;  Surgeon: Dolores Frame, MD;  [facility];  Service: Gastroenterology;;   BOTOX INJECTION N/A 03/31/2022   Procedure: BOTOX INJECTION;  Surgeon: Dolores Frame, MD;  [facility];  Service: Gastroenterology;  Laterality: N/A;   BREAST BIOPSY Right 2019   benign   BREAST ENHANCEMENT SURGERY  Bilateral 1993   BREAST IMPLANT REMOVAL Bilateral    BREAST SURGERY N/A    Phreesia 10/29/2020   CARDIAC CATHETERIZATION  07-06-2003   dr Smitty Cords brodie   normal coronaries and LVF   CARDIAC ELECTROPHYSIOLOGY STUDY AND ABLATION  1996  and 1997   CARDIOVASCULAR STRESS TEST  11-18-2015   dr Graciela Husbands   normal nuclear study w/ no ischemia/  normal LV function and wall motion , ef 84%   CARPAL TUNNEL RELEASE Right ?   CESAREAN SECTION  1986   CESAREAN SECTION N/A    Phreesia 10/29/2020   CHOLECYSTECTOMY N/A    Phreesia 10/29/2020   COLON SURGERY     COLONOSCOPY WITH PROPOFOL N/A 05/02/2021  Procedure: COLONOSCOPY WITH PROPOFOL;  Surgeon: Dolores Frame, MD;  [facility];  Service: Gastroenterology;  Laterality: N/A;  12:30 PM   CYSTO/  HYDRODISTENTION/  INSTILATION THERAPY  MULTIPLE   ENTEROCUTANEOUS FISTULA CLOSURE  multiple   last one 2015 with small bowel resection   ESOPHAGOGASTRODUODENOSCOPY (EGD) WITH PROPOFOL N/A 05/02/2021   Procedure: ESOPHAGOGASTRODUODENOSCOPY (EGD) WITH PROPOFOL;  Surgeon: Dolores Frame, MD;  [facility];  Service: Gastroenterology;  Laterality: N/A;   ESOPHAGOGASTRODUODENOSCOPY (EGD) WITH PROPOFOL N/A 12/04/2021   Procedure: ESOPHAGOGASTRODUODENOSCOPY (EGD) WITH PROPOFOL;  Surgeon: Lanelle Bal, DO;  [facility];  Service: Endoscopy;  Laterality: N/A;   ESOPHAGOGASTRODUODENOSCOPY (EGD) WITH PROPOFOL N/A 03/31/2022   Procedure: ESOPHAGOGASTRODUODENOSCOPY (EGD) WITH PROPOFOL;  Surgeon: Dolores Frame, MD;  [facility];  Service: Gastroenterology;  Laterality: N/A;  1015   EXPLORATORY LAPAROTOMY INCISIONAL VENTRAL HERNIA REPAIR / RESECTION SMALL BOWEL  11-09-2014   @[facility]    FRACTURE SURGERY N/A    Phreesia 10/29/2020   JOINT REPLACEMENT N/A    Phreesia 10/29/2020   LIPOMA EXCISION Right 09/16/2018   Procedure: EXCISION LIPOMA UPPER BACK;  Surgeon: Berna Bue, MD;  Location: [facility];  Service: General;  Laterality: Right;   MASTECTOMY Left 2006   w/ reconstruction on left (paget's disease)  and right breast augmentation   PERIPHERAL VASCULAR BALLOON ANGIOPLASTY Right 09/13/2023   Procedure: PERIPHERAL VASCULAR BALLOON ANGIOPLASTY;  Surgeon: Maeola Harman, MD;  Location: [facility] INVASIVE CV LAB;  Service: Cardiovascular;  Laterality: Right;  Subclavian and axillary   POLYPECTOMY  05/02/2021   Procedure: POLYPECTOMY;  Surgeon: Dolores Frame, MD;  [facility];  Service: Gastroenterology;;  gastric   POLYPECTOMY  03/31/2022   Procedure: POLYPECTOMY;  Surgeon: Dolores Frame, MD;  [facility];  Service: Gastroenterology;;   Gaspar Bidding DILATION  05/02/2021   Procedure: Gaspar Bidding DILATION;  Surgeon: Dolores Frame, MD;  [facility];  Service: Gastroenterology;;   SMALL INTESTINE SURGERY N/A    Phreesia 10/29/2020   SPINE SURGERY N/A    Phreesia 10/29/2020   TOTAL COLECTOMY  08-04-2002    @[facility]    AND CHOLECYSTECTOMY  (colonic inertia)   TRANSTHORACIC ECHOCARDIOGRAM  02/04/2016   ef 60-65%,  grade 2 diastolic dysfunction/  mild MR   TUBAL LIGATION N/A    Phreesia 03/16/2021   UPPER EXTREMITY VENOGRAPHY N/A 09/13/2023   Procedure: UPPER EXTREMITY VENOGRAPHY;  Surgeon: Maeola Harman, MD;  Location: [facility] INVAS[facility] CV LAB;  Service: Cardiovascular;  Laterality: N/A;   VENA CAVA FILTER PLACEMENT  05/08/2005   @[facility]    [place] (non-retrievable)   WIDE EXCISION PERIRECTAL ABSCESSES  09-22-2005   @ [facility]   Active Ambulatory Problems    Diagnosis Date Noted   Paget's disease of breast, left (HCC) 03/16/2008   Hypothyroidism 03/16/2008   Hyperlipidemia 03/16/2008   Iron deficiency anemia 08/07/2008   Anxiety state 03/16/2008   CARPAL TUNNEL SYNDROME, RIGHT 06/09/2010   OVARIAN CYST 12/26/2009   Whole body pain 03/16/2008   Fibromyalgia 03/16/2008   Hx SBO 08/07/2008   Interstitial  cystitis 02/17/2011   Metabolic syndrome X 02/10/2011   Insomnia 12/15/2011   Encounter for monitoring opioid maintenance therapy 01/16/2013   Chronic pain syndrome 01/16/2013   Seasonal allergies 05/10/2013   Irregular heart rate 07/25/2013   Chest pain 06/14/2014   Low ferritin level 06/14/2014   Chronic right-sided low back pain with bilateral sciatica 07/17/2014  Vitamin D deficiency 05/05/2015   Dysuria 06/11/2015   Syncope 01/23/2016   Headache syndrome 01/26/2016   S/P insertion of IVC (inferior vena caval) filter 03/05/2016   Bilateral hand pain 07/27/2016   Intermittent palpitations 08/22/2017   Abdominal pain 06/06/2019   Low back pain radiating to right leg 07/04/2019   Hip pain, right 07/08/2019   Ventral hernia 08/03/2012   Muscle spasms of both lower extremities 01/03/2020   Long-term current use of opiate analgesic 10/22/2020   Adjustment disorder with anxiety 08/31/2013   B12 deficiency 10/11/2018   Embedded foreign body 03/01/2018   History of DVT (deep vein thrombosis) 03/30/2016   History of pulmonary embolus (PE) 03/30/2016   Malabsorption 09/23/2018   Migraine 10/22/2020   Other specified abnormal findings of blood chemistry 10/11/2018   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, left (HCC) 09/23/2018   Spinal stenosis of lumbar region with radiculopathy 08/28/2015   Stress incontinence 02/18/2012   Uncontrolled pain 04/15/2015   Ovarian cancer (HCC) 12/03/2020   ADHD, predominantly hyperactive type 01/20/2021   Muscle spasm 01/20/2021   Bloating 04/17/2021   Hematuria 07/18/2021   Post-resection malabsorption 09/18/2021   Bilious vomiting with nausea 11/11/2021   Dilation of biliary tract 11/11/2021   Intractable vomiting 11/12/2021   Dilation of small bowel anastomosis    Abnormal CT scan, small bowel    Abnormal small bowel motility 11/28/2021   Gastroparesis 11/28/2021   Hypokalemia    Hypomagnesemia    Dark stools    Anemia     SBO (small bowel obstruction) (HCC) 03/28/2022   Generalized dysmotility of intestine    Myalgia, lower leg 04/01/2022   Opioid dependence (HCC) 04/02/2022   Dehydration 05/12/2022   Light-headedness 05/13/2022   Hypotensive episode 05/13/2022   Partial small bowel obstruction (HCC) 06/03/2022   Onychomycosis 12/06/2022   Vaginitis 12/06/2022   Atrial fibrillation, chronic (HCC) 04/23/2023   Encounter for support and coordination of transition of care 05/03/2023   Menopausal vaginal dryness 05/03/2023   Vaginal stenosis 05/03/2023   S/P hysterectomy with oophorectomy 05/25/2023   Vaginal dryness 05/25/2023   Right wrist pain 07/10/2023   Right arm pain 07/10/2023   Chronic anxiety 07/10/2023   Obesity (BMI 30.0-34.9) 07/10/2023   Recurrent UTI (urinary tract infection) 10/17/2023   TOS (thoracic outlet syndrome) 11/15/2023   Encounter for routine adult physical exam with abnormal findings 01/04/2024   Status post cervical spinal fusion 01/18/2024   History of cervical discectomy 01/18/2024   Cervical radicular pain 01/18/2024   Resolved Ambulatory Problems    Diagnosis Date Noted   Obesity 03/16/2008   DEPRESSION 08/08/2009   Acute cystitis 06/09/2010   Backache 03/16/2008   Other malaise and fatigue 03/16/2008   Tachycardia 02/03/2010   SUPRAVENTRICULAR TACHYCARDIA, HX OF 08/07/2008   Disorder of bone and cartilage 02/10/2011   Fistula 08/28/2011   Acute renal insufficiency 08/28/2011   Abdominal wound dehiscence 08/29/2011   Depression, major 11/04/2011   Postsurgical menopause 11/13/2011   Dermatitis 12/15/2011   Ulcer 12/15/2011   Swelling of hand 09/17/2012   Fatigue 09/17/2012   Hand edema 09/21/2012   Hoarseness, chronic 01/16/2013   ROM (right otitis media) 01/23/2013   Sinusitis, acute 01/23/2013   Bronchitis, acute 01/23/2013   Morbid obesity (HCC) 03/27/2013   Acute bronchitis 05/01/2013   Chronic cough 05/10/2013   GERD (gastroesophageal reflux  disease) 05/10/2013   Vaginitis and vulvovaginitis 05/10/2013   Urinary frequency 07/18/2014   Oral ulceration 08/23/2014  Medically noncompliant 02/28/2015   H/O bilateral salpingo-oophorectomy 02/28/2015   Encounter for examination following treatment at hospital 06/11/2015   Need for prophylactic vaccination and inoculation against influenza 09/23/2015   Sinusitis, chronic 01/26/2016   Fall at home 01/26/2016   Need for Tdap vaccination 12/16/2016   Snoring 12/21/2016   Recurrent oral herpes simplex infection 10/13/2017   MDD (major depressive disorder), single episode, in full remission (HCC) 10/27/2017   Educated about COVID-19 virus infection 05/14/2019   Presence of IVC filter 06/06/2019   Acute postoperative pain 11/10/2014   Enterocutaneous fistula 08/03/2012   Essential hypertension 08/22/2017   Generalized rash 08/15/2020   Partial small bowel obstruction (HCC) 07/04/2012   Right flank pain 09/07/2014   Chronic diarrhea 04/17/2021   Cystitis 07/18/2021   Fever 07/18/2021   Intermittent fever 07/20/2021   Diarrhea 11/11/2021   Dysphagia 12/04/2021   Past Medical History:  Diagnosis Date   Abdominal hernia 08/14/2019   Allergy    Anxiety    Arthritis    Back pain with radiation 07/17/2014   Blood transfusion without reported diagnosis    Breast cancer (HCC)    Cancer (HCC)    Chronic constipation    Clotting disorder (HCC)    Depression    Difficult intubation    Family history of adverse reaction to anesthesia    FIBROMYALGIA 03/16/2008   Headache disorder 01/16/2013   History of MRSA infection    History of ovarian cyst 06/2011   History of supraventricular tachycardia    History of TIA (transient ischemic attack) 1997   Hyperlipidemia LDL goal <100 03/16/2008   Incisional hernia    INTERSTITIAL CYSTITIS 02/17/2011   Lipoma of back    Migraines    Nausea alone 07/17/2014   NECK PAIN, CHRONIC 03/16/2008   Normal coronary arteries    Paget disease  of breast, left (HCC)    PONV (postoperative nausea and vomiting)    PSVT (paroxysmal supraventricular tachycardia) (HCC)    S/P radiofrequency ablation operation for arrhythmia 1996   SMALL BOWEL OBSTRUCTION, HX OF 08/07/2008   Stroke (HCC)    Thyroid disease    Wears glasses    Constitutional Exam  General appearance: Well nourished, well developed, and well hydrated. In no apparent acute distress Vitals:   01/18/24 1325  BP: 97/73  Pulse: 82  Resp: 16  Temp: (!) 97.5 F (36.4 C)  SpO2: 98%  Weight: 187 lb (84.8 kg)  Height: 5' 4.5" (1.638 m)   BMI Assessment: Estimated body mass index is 31.6 kg/m as calculated from the following:   Height as of this encounter: 5' 4.5" (1.638 m).   Weight as of this encounter: 187 lb (84.8 kg).  BMI interpretation table: BMI level Category Range association with higher incidence of chronic pain  <18 kg/m2 Underweight   18.5-24.9 kg/m2 Ideal body weight   25-29.9 kg/m2 Overweight Increased incidence by 20%  30-34.9 kg/m2 Obese (Class I) Increased incidence by 68%  35-39.9 kg/m2 Severe obesity (Class II) Increased incidence by 136%  >40 kg/m2 Extreme obesity (Class III) Increased incidence by 254%   Patient's current BMI Ideal Body weight  Body mass index is 31.6 kg/m. Ideal body weight: 55.8 kg (123 lb 2 oz) Adjusted ideal body weight: 67.4 kg (148 lb 10.8 oz)   BMI Readings from Last 4 Encounters:  01/18/24 31.60 kg/m  01/04/24 32.10 kg/m  12/20/23 33.13 kg/m  12/03/23 32.62 kg/m   Wt Readings from Last 4 Encounters:  01/18/24 187  lb (84.8 kg)  01/04/24 187 lb (84.8 kg)  12/20/23 193 lb (87.5 kg)  12/03/23 193 lb 0.6 oz (87.6 kg)    Psych/Mental status: Alert, oriented x 3 (person, place, & time)       Eyes: PERLA Respiratory: No evidence of acute respiratory distress  Cervical Spine Area Exam  Skin & Axial Inspection: Well healed scar from previous spine surgery detected Alignment: Symmetrical Functional ROM: Pain  restricted ROM      Stability: No instability detected Muscle Tone/Strength: Functionally intact. No obvious neuro-muscular anomalies detected. Sensory (Neurological): Referred pain pattern Palpation: No palpable anomalies             Upper Extremity (UE) Exam    Side: Right upper extremity  Side: Left upper extremity  Skin & Extremity Inspection: Skin color, temperature, and hair growth are WNL. No peripheral edema or cyanosis. No masses, redness, swelling, asymmetry, or associated skin lesions. No contractures.  Skin & Extremity Inspection: Skin color, temperature, and hair growth are WNL. No peripheral edema or cyanosis. No masses, redness, swelling, asymmetry, or associated skin lesions. No contractures.  Functional ROM: Pain restricted ROM          Functional ROM: Unrestricted ROM          Muscle Tone/Strength: Functionally intact. No obvious neuro-muscular anomalies detected.  Muscle Tone/Strength: Functionally intact. No obvious neuro-muscular anomalies detected.  Sensory (Neurological): Neurogenic pain pattern          Sensory (Neurological): Unimpaired          Palpation: No palpable anomalies              Palpation: No palpable anomalies              Provocative Test(s):  Phalen's test: deferred Tinel's test: deferred Apley's scratch test (touch opposite shoulder):  Action 1 (Across chest): Decreased ROM Action 2 (Overhead): Decreased ROM Action 3 (LB reach): Decreased ROM   Provocative Test(s):  Phalen's test: deferred Tinel's test: deferred Apley's scratch test (touch opposite shoulder):  Action 1 (Across chest): deferred Action 2 (Overhead): deferred Action 3 (LB reach): deferred    5 out of 5 strength bilateral lower extremity: Plantar flexion, dorsiflexion, knee flexion, knee extension.   Assessment  Primary Diagnosis & Pertinent Problem List: The primary encounter diagnosis was TOS (thoracic outlet syndrome). Diagnoses of Cervical radicular pain, Status post cervical  spinal fusion, Right arm pain, History of cervical discectomy, and Cervicalgia were also pertinent to this visit.  Visit Diagnosis (New problems to examiner): 1. TOS (thoracic outlet syndrome)   2. Cervical radicular pain   3. Status post cervical spinal fusion   4. Right arm pain   5. History of cervical discectomy   6. Cervicalgia    Plan of Care (Initial workup plan)      Thoracic Outlet Syndrome (TOS) Chronic pain and swelling in the right hand and arm worsen with arm elevation, accompanied by discoloration, coldness, and pain radiating from the neck to the arm. A positive Adson's test and previous unsuccessful balloon angioplasty are noted in chart. Plan to perform a right-sided scalene block for symptom relief and diagnostic confirmation, using ultrasound guidance.  Potential risks discussed with patient.  Cervical Spine Issues s/p C-fusion Persistent neck pain and swelling persist post-cervical fusion from 2003, with potential nerve compression. No recent cervical MRI has been conducted. Order an MRI of the cervical spine to assess nerve compression.  Follow-up Follow up in two weeks after the scalene block.  Imaging Orders         MR CERVICAL SPINE WO CONTRAST     Procedure Orders         BRACHIAL PLEXUS BLOCK     Provider-requested follow-up: Return in about 15 days (around 02/02/2024) for Right scalene TOS block , in clinic (PO Valium 5mg ) block 40 mins.  Future Appointments  Date Time Provider Department Center  01/24/2024 11:00 AM Duke Salvia, MD [facility]  02/02/2024  1:00 PM Mallipeddi, Orion Modest, MD [facility]  02/08/2024  9:00 AM Donnita Falls, FNP [facility] None  02/29/2024  2:00 PM [facility] None  03/01/2024  1:40 PM Maeola Harman, MD [facility]  03/02/2024  2:00 PM Bjorn Pippin, MD [facility] None  03/07/2024  2:30 PM Carnella Guadalajara, PA-C [facility] None  04/04/2024  2:50 PM Drema Dallas, DO [facility] None   05/05/2024  1:40 PM Kerri Perches, MD [facility]  08/14/2024  3:30 PM Ahmed, Juanetta Beets, MD [facility] None  10/23/2024 10:00 AM [facility]-ANNUAL WELLNESS VISIT [facility]    Duration of encounter: .  Total time on encounter, as per AMA guidelines included both the face-to-face and non-face-to-face time personally spent by the physician and/or other qualified health care professional(s) on the day of the encounter (includes time in activities that require the physician or other qualified health care professional and does not include time in activities normally performed by clinical staff). Physician's time may include the following activities when performed: Preparing to see the patient (e.g., pre-charting review of records, searching for previously ordered imaging, lab work, and nerve conduction tests) Review of prior analgesic pharmacotherapies. Reviewing PMP Interpreting ordered tests (e.g., lab work, imaging, nerve conduction tests) Performing post-procedure evaluations, including interpretation of diagnostic procedures Obtaining and/or reviewing separately obtained history Performing a medically appropriate examination and/or evaluation Counseling and educating the patient/family/caregiver Ordering medications, tests, or procedures Referring and communicating with other health care professionals (when not separately reported) Documenting clinical information in the electronic or other health record Independently interpreting results (not separately reported) and communicating results to the patient/ family/caregiver Care coordination (not separately reported)  Note by: Edward Jolly, MD (AI and TTS technology used. I apologize for any typographical errors that were not detected and corrected.) Date: 01/18/2024; Time: 2:58 PM

## 2024-01-18 NOTE — Telephone Encounter (Signed)
D/w pre op APP Bernadene Person, NP ok to add preop clearance needed to appt notes for appt with Dr. Graciela Husbands 01/24/24. I will update all parties involved.

## 2024-01-19 ENCOUNTER — Other Ambulatory Visit: Payer: Self-pay

## 2024-01-19 ENCOUNTER — Encounter: Payer: Self-pay | Admitting: Family Medicine

## 2024-01-19 DIAGNOSIS — Z780 Asymptomatic menopausal state: Secondary | ICD-10-CM

## 2024-01-20 ENCOUNTER — Other Ambulatory Visit (INDEPENDENT_AMBULATORY_CARE_PROVIDER_SITE_OTHER): Payer: Self-pay | Admitting: Gastroenterology

## 2024-01-21 ENCOUNTER — Other Ambulatory Visit: Payer: Self-pay | Admitting: Family Medicine

## 2024-01-22 ENCOUNTER — Encounter: Payer: Self-pay | Admitting: Family Medicine

## 2024-01-24 ENCOUNTER — Encounter: Payer: Self-pay | Admitting: Family Medicine

## 2024-01-24 ENCOUNTER — Ambulatory Visit: Payer: Medicare Other | Attending: Internal Medicine | Admitting: Internal Medicine

## 2024-01-24 ENCOUNTER — Other Ambulatory Visit (INDEPENDENT_AMBULATORY_CARE_PROVIDER_SITE_OTHER): Payer: Self-pay | Admitting: Gastroenterology

## 2024-01-24 ENCOUNTER — Encounter (INDEPENDENT_AMBULATORY_CARE_PROVIDER_SITE_OTHER): Payer: Self-pay | Admitting: Gastroenterology

## 2024-01-24 ENCOUNTER — Telehealth (INDEPENDENT_AMBULATORY_CARE_PROVIDER_SITE_OTHER): Payer: Self-pay | Admitting: *Deleted

## 2024-01-24 ENCOUNTER — Other Ambulatory Visit: Payer: Self-pay | Admitting: Family Medicine

## 2024-01-24 DIAGNOSIS — R002 Palpitations: Secondary | ICD-10-CM

## 2024-01-24 MED ORDER — LINACLOTIDE 290 MCG PO CAPS: 290.0000 ug | ORAL_CAPSULE | Freq: Every day | ORAL | 11 refills | Status: DC

## 2024-01-24 NOTE — Telephone Encounter (Signed)
No show

## 2024-01-24 NOTE — Telephone Encounter (Signed)
Linzess 290 mcg/day sent to her pharmacy, she should stop taking Amitiza.

## 2024-01-24 NOTE — Telephone Encounter (Signed)
Please inform patient:  I want to clarify that I did not mention increasing your Xanax dosage to three times daily. If there was a misunderstanding, I apologize for any confusion.  If you are considering a dosage adjustment, I recommend discussing this with your primary care provider or psychiatrist, as they would need to evaluate and approve any changes to your current prescription.

## 2024-01-24 NOTE — Telephone Encounter (Signed)
Patient lubiprostone has went up to $300 for 90 day supply. Pharmacy told her she could get linzess for $45 for a 30 day supply. Patient wanted to see if she could switch and get a 30 day instead of 90 day supply. Last office visit was 08/09/23  [employer]  [phone]

## 2024-01-24 NOTE — Progress Notes (Deleted)
Patient Care Team: Kerri Perches, MD as PCP - General Christell Constant, MD as PCP - Cardiology (Cardiology) Duke Salvia, MD as PCP - Electrophysiology (Cardiology) Toy Cookey (Inactive) as Referring Physician Ray, Collene Schlichter, MD (Anesthesiology) Donnetta Hail, MD as Consulting Physician (Rheumatology) Ray, Collene Schlichter, MD as Referring Physician (Anesthesiology) Eben Burow, MD as Referring Physician (Cardiothoracic Surgery)   HPI  [name] is a 63 y.o. female seen after hiatus of 4 years which followed a hiatus of 6 years  Remote arrhythmia with ablation [phone]  Series of procedures for breast cancer and for ovarian cyst  with catastrophic complications involving her GI tract as well as breast implants including pulmonary embolism for which she received an IVC filter 2006 and sepsis.     Her IVC filter apparently eroded through vena cava and was beginning to ossify.  She was seen 11/19 for preoperative assessment for its removal which was *** done in [place]  DATE TEST EF    1/20 CTA Ca Score 0    1/20 Echo   60-65 %    3/23 CTA Ca Score 0    9/24 Echo  60-65%      Event recorder 1/20 palpitations personally reviewed.  Palpitations were associated with sinus rhythm and an occasional ectopic beat. 9/21 Monitor triggered events>> sinus  5/24 Monitor>> riggered mostly sinus, but many also with PVCs  Nonsustained atrial tach  INTERVAL VISIT w VVS 2/2 upper extremity swelling and loss of pulse with Adson maneuver consistent with thoracic outlet and for which she underwent balloon venoplasty with discussion of first rib resection  Here for preoperative assessment ***      Date Cr K Hgb  1/20 0.79 4.3 14.0(11/19)   Records and Results Reviewed***  Past Medical History:  Diagnosis Date   Abdominal hernia 08/14/2019   Abdominal pain 06/06/2019   Acute bronchitis 05/01/2013   Acute cystitis 06/09/2010   Qualifier: Diagnosis  of  By: Lillia Mountain LPN, Brandi     Acute renal insufficiency 08/28/2011   Allergy    Phreesia 03/16/2021   Anemia    Phreesia 10/29/2020   Anxiety    Anxiety state 03/16/2008   Qualifier: Diagnosis of  By: Lind Guest     Arthritis    Phreesia 10/29/2020   Back pain with radiation 07/17/2014   Bilateral hand pain 07/27/2016   Blood transfusion without reported diagnosis    Phreesia 10/29/2020   Breast cancer (HCC)    L breast- 2006   Cancer (HCC)    Phreesia 10/29/2020   Carpal tunnel syndrome    Chronic constipation    Chronic pain syndrome    followed by [facility]---  back   Clotting disorder (HCC)    Phreesia 10/29/2020   Depression    Dermatitis 12/15/2011   Difficult intubation    Educated about COVID-19 virus infection 05/14/2019   Fall at home 01/26/2016   Family history of adverse reaction to anesthesia    MOTHER--- PONV   Fatigue 09/17/2012   Fibromyalgia    FIBROMYALGIA 03/16/2008   Qualifier: Diagnosis of  By: Lind Guest     GERD (gastroesophageal reflux disease)    Headache disorder 01/16/2013   Headache syndrome 01/26/2016   Hip pain, right 07/08/2019   History of MRSA infection    lip abscess   History of ovarian cyst 06/2011   s/p  BSO   History of pulmonary embolus (PE) 1997  post EP with ablation pulmonary veouns for SVT/ Atrial Fib.   History of supraventricular tachycardia    s/p  ablation 1996  and 1997  by dr Graciela Husbands   History of TIA (transient ischemic attack) 1997   post op EP ablation PE   Hyperlipidemia    Hyperlipidemia LDL goal <100 03/16/2008   Qualifier: Diagnosis of  By: Lind Guest     Hypothyroidism    followed by pcp   Incisional hernia    Insomnia 12/15/2011   Intermittent palpitations 08/22/2017   Interstitial cystitis    09-13-2018   per pt last flare-up  May 2019 (followed by pcp)   INTERSTITIAL CYSTITIS 02/17/2011   Qualifier: Diagnosis of  By: Lodema Hong MD, Margaret     Iron deficiency anemia    09-13-2018   PER PT STABLE   Irregular heart rate 07/25/2013   Lipoma of back    upper   Low back pain radiating to right leg 07/04/2019   Low ferritin level 06/14/2014   MDD (major depressive disorder), single episode, in full remission (HCC) 10/27/2017   Medically noncompliant 02/28/2015   Multiple missed appointments, both follow-up appointments and lab appointments.    Metabolic syndrome X 02/10/2011   Qualifier: Diagnosis of  By: Lodema Hong MD, Margaret  hBA1c is 5.8 in 02/2013    Migraines    Morbid obesity (HCC) 03/27/2013   Muscle spasm 01/03/2020   Nausea alone 07/17/2014   NECK PAIN, CHRONIC 03/16/2008   Qualifier: Diagnosis of  By: Lind Guest     Normal coronary arteries    a. by CT 12/2018.   Obesity 03/16/2008   Qualifier: Diagnosis of  By: Lind Guest     Oral ulceration 08/23/2014   Presented at 06/14/2014 visit    Other malaise and fatigue 03/16/2008   Centricity Description: FATIGUE, CHRONIC Qualifier: Diagnosis of  By: Lind Guest   Centricity Description: FATIGUE Qualifier: Diagnosis of  By: Garnette Czech PA, Dawn     OVARIAN CYST 12/26/2009   Qualifier: Diagnosis of  By: Lodema Hong MD, Margaret     Paget disease of breast, left [facility])    Paget's disease of breast, left (HCC) 03/16/2008   Qualifier: Diagnosis of  By: Lind Guest  Left diagnosed in 2006 F/h breast cancer x 15 family members   Partial small bowel obstruction (HCC) 07/04/2012   PONV (postoperative nausea and vomiting)    SEVERE   Postsurgical menopause 11/13/2011   Presence of IVC filter 06/06/2019   PSVT (paroxysmal supraventricular tachycardia) (HCC)    HX ABLATION 1996 AND 1997   Recurrent oral herpes simplex infection 10/13/2017   ROM (right otitis media) 01/23/2013   S/P insertion of IVC (inferior vena caval) filter 05/08/2005   [place] (non-retrievable)  /  dx 2019  a leg of filter is protruding thru the vena cava in to right L2 vertebral body (09-13-2018  per pt having surgery to remove filter in  [place])   S/P radiofrequency ablation operation for arrhythmia 1996   1996  and 1997,   SVT and Atrial Fib   Sinusitis, chronic 01/26/2016   SMALL BOWEL OBSTRUCTION, HX OF 08/07/2008   Annotation: obstruction w/ adhesions led to partial colectomy Qualifier: Diagnosis of  By: Minna Merritts     Stroke [facility])    Phreesia 10/29/2020, TIAs in the past   Swelling of hand 09/17/2012   Syncope    Tachycardia 02/03/2010   Qualifier: Diagnosis of  By: Via LPN, Larita Fife     Thyroid disease  Phreesia 10/29/2020   TOS (thoracic outlet syndrome)    Ulcer 12/15/2011   Urinary frequency 07/18/2014   Vaginitis and vulvovaginitis 05/10/2013   Vitamin D deficiency 05/05/2015   Wears glasses     Past Surgical History:  Procedure Laterality Date   ABDOMINAL HYSTERECTOMY  1987   ANTERIOR CERVICAL DECOMP/DISCECTOMY FUSION  03-07-2002   dr elsner  @[facility]    C 4 -- 5   APPENDECTOMY  1980   AUGMENTATION MAMMAPLASTY Right 2006   BILATERAL SALPINGOOPHORECTOMY  07/24/2011   via Explor. Lap. w/ intraoperative perf. bowel repair   BIOPSY  05/02/2021   Procedure: BIOPSY;  Surgeon: Marguerita Merles, Reuel Boom, MD;  [facility];  Service: Gastroenterology;;  small bowel, mid esophagus, distal esophagus, random colon biopsies   BIOPSY  12/04/2021   Procedure: BIOPSY;  Surgeon: Lanelle Bal, DO;  [facility];  Service: Endoscopy;;   BIOPSY  03/31/2022   Procedure: BIOPSY;  Surgeon: Dolores Frame, MD;  [facility];  Service: Gastroenterology;;   BOTOX INJECTION N/A 03/31/2022   Procedure: BOTOX INJECTION;  Surgeon: Dolores Frame, MD;  [facility];  Service: Gastroenterology;  Laterality: N/A;   BREAST BIOPSY Right 2019   benign   BREAST ENHANCEMENT SURGERY Bilateral 1993   BREAST IMPLANT REMOVAL Bilateral    BREAST SURGERY N/A    Phreesia 10/29/2020   CARDIAC CATHETERIZATION  07-06-2003   dr Smitty Cords brodie   normal coronaries and LVF   CARDIAC  ELECTROPHYSIOLOGY STUDY AND ABLATION  1996  and 1997   CARDIOVASCULAR STRESS TEST  11-18-2015   dr Graciela Husbands   normal nuclear study w/ no ischemia/  normal LV function and wall motion , ef 84%   CARPAL TUNNEL RELEASE Right ?   CESAREAN SECTION  1986   CESAREAN SECTION N/A    Phreesia 10/29/2020   CHOLECYSTECTOMY N/A    Phreesia 10/29/2020   COLON SURGERY     COLONOSCOPY WITH PROPOFOL N/A 05/02/2021   Procedure: COLONOSCOPY WITH PROPOFOL;  Surgeon: Dolores Frame, MD;  [facility];  Service: Gastroenterology;  Laterality: N/A;  12:30 PM   CYSTO/  HYDRODISTENTION/  INSTILATION THERAPY  MULTIPLE   ENTEROCUTANEOUS FISTULA CLOSURE  multiple   last one 2015 with small bowel resection   ESOPHAGOGASTRODUODENOSCOPY (EGD) WITH PROPOFOL N/A 05/02/2021   Procedure: ESOPHAGOGASTRODUODENOSCOPY (EGD) WITH PROPOFOL;  Surgeon: Dolores Frame, MD;  [facility];  Service: Gastroenterology;  Laterality: N/A;   ESOPHAGOGASTRODUODENOSCOPY (EGD) WITH PROPOFOL N/A 12/04/2021   Procedure: ESOPHAGOGASTRODUODENOSCOPY (EGD) WITH PROPOFOL;  Surgeon: Lanelle Bal, DO;  [facility];  Service: Endoscopy;  Laterality: N/A;   ESOPHAGOGASTRODUODENOSCOPY (EGD) WITH PROPOFOL N/A 03/31/2022   Procedure: ESOPHAGOGASTRODUODENOSCOPY (EGD) WITH PROPOFOL;  Surgeon: Dolores Frame, MD;  [facility];  Service: Gastroenterology;  Laterality: N/A;  1015   EXPLORATORY LAPAROTOMY INCISIONAL VENTRAL HERNIA REPAIR / RESECTION SMALL BOWEL  11-09-2014   @[facility]    FRACTURE SURGERY N/A    Phreesia 10/29/2020   JOINT REPLACEMENT N/A    Phreesia 10/29/2020   LIPOMA EXCISION Right 09/16/2018   Procedure: EXCISION LIPOMA UPPER BACK;  Surgeon: Berna Bue, MD;  Location: [facility] ;  Service: General;  Laterality: Right;   MASTECTOMY Left 2006   w/ reconstruction on left (paget's disease)  and right breast augmentation   PERIPHERAL VASCULAR BALLOON  ANGIOPLASTY Right 09/13/2023   Procedure: PERIPHERAL VASCULAR BALLOON ANGIOPLASTY;  Surgeon: Maeola Harman, MD;  Location: [facility];  Service: Cardiovascular;  Laterality: Right;  Subclavian and axillary   POLYPECTOMY  05/02/2021   Procedure: POLYPECTOMY;  Surgeon: Dolores Frame, MD;  [facility];  Service: Gastroenterology;;  gastric   POLYPECTOMY  03/31/2022   Procedure: POLYPECTOMY;  Surgeon: Dolores Frame, MD;  [facility];  Service: Gastroenterology;;   Gaspar Bidding DILATION  05/02/2021   Procedure: Gaspar Bidding DILATION;  Surgeon: Dolores Frame, MD;  [facility];  Service: Gastroenterology;;   SMALL INTESTINE SURGERY N/A    Phreesia 10/29/2020   SPINE SURGERY N/A    Phreesia 10/29/2020   TOTAL COLECTOMY  08-04-2002    @[facility]    AND CHOLECYSTECTOMY  (colonic inertia)   TRANSTHORACIC ECHOCARDIOGRAM  02/04/2016   ef 60-65%,  grade 2 diastolic dysfunction/  mild MR   TUBAL LIGATION N/A    Phreesia 03/16/2021   UPPER EXTREMITY VENOGRAPHY N/A 09/13/2023   Procedure: UPPER EXTREMITY VENOGRAPHY;  Surgeon: Maeola Harman, MD;  [facility]VAS[facility] CV [facility];  Service: Cardiovascular;  Laterality: N/A;   VENA CAVA FILTER PLACEMENT  05/08/2005   @[facility]    [place] (non-retrievable)   WIDE EXCISION PERIRECTAL ABSCESSES  09-22-2005   @ [facility]    No outpatient medications have been marked as taking for the 01/24/24 encounter (Appointment) with [facility] Salvia, MD.   Current Facility-Administered Medications for the 01/24/24 encounter (Appointment) with [facility] Salvia, MD  Medication   methylPREDNISolone acetate (DEPO-MEDROL) injection 40 mg    Allergies  Allergen Reactions   Nitrofurantoin Hives   Crestor [Rosuvastatin Calcium] Other (See Comments)    Generalized cramps   Bisacodyl Other (See Comments)    Makes patient feel like she is having cramps    Codeine Itching   Iron Sucrose Other (See Comments)     Flushing- required benadryl and solu medrol   Monosodium Glutamate Other (See Comments)    Cluster migraines    Scopolamine Hbr Other (See Comments)    Cluster migraines, impaired vision   Morphine Rash   Other Hives, Itching, Swelling, Rash and Other (See Comments)    Cigarette smoke      Review of Systems negative except from HPI and PMH  Physical Exam There were no vitals taken for this visit. Well developed and well nourished in no acute distress HENT normal E scleral and icterus clear Neck Supple JVP flat; carotids brisk and full Clear to ausculation {CARD RHYTHM:10874} ***Regular rate and rhythm, no murmurs gallops or rub Soft with active bowel sounds No clubbing cyanosis {Numbers; edema:17696} Edema Alert and oriented, grossly normal motor and sensory function Skin Warm and Dry  ECG ***  CrCl cannot be calculated (Patient's most recent lab result is older than the maximum 21 days allowed.).   Assessment and  Plan       Current medicines are reviewed at length with the patient today .  The patient does not*** have concerns regarding medicines.

## 2024-01-25 ENCOUNTER — Encounter: Payer: Self-pay | Admitting: Internal Medicine

## 2024-01-25 MED ORDER — ALPRAZOLAM 1 MG PO TABS: 1.0000 mg | ORAL_TABLET | Freq: Two times a day (BID) | ORAL | 5 refills | Status: DC | PRN

## 2024-01-25 NOTE — Telephone Encounter (Signed)
CLEARANCE ADDENDUM: WILL NEED RECOMMENDATIONS FOR ASA HOLD

## 2024-01-25 NOTE — Telephone Encounter (Signed)
I will update the surgeon office that the pt did not show for her appt with Dr. Graciela Husbands today. Pt does have her appt with Dr. Jenene Slicker on 02/02/24. I will need to add preop clearance to appt notes for MD.   Pt is not cleared until she has been seen. Procedure is scheduled for 02/10/24.

## 2024-01-25 NOTE — Telephone Encounter (Signed)
Patient called and left message on her voicemail that linzess was sent to pharmacy and she should stop taking amitiza.

## 2024-01-26 ENCOUNTER — Other Ambulatory Visit: Payer: Self-pay

## 2024-01-26 ENCOUNTER — Telehealth: Payer: Self-pay | Admitting: Internal Medicine

## 2024-01-26 NOTE — Telephone Encounter (Signed)
Pt calling to r/s NP Appt with Dr Graciela Husbands.

## 2024-01-27 ENCOUNTER — Telehealth: Payer: Self-pay

## 2024-01-27 NOTE — Telephone Encounter (Signed)
I called patient, she states she wanted to change the time of her CT.  Nothing further needed at this time.

## 2024-01-27 NOTE — Telephone Encounter (Signed)
Patient called and left voice message to seen if she can get her surgery time for a later time.

## 2024-01-28 ENCOUNTER — Ambulatory Visit (HOSPITAL_COMMUNITY)
Admission: RE | Admit: 2024-01-28 | Discharge: 2024-01-28 | Disposition: A | Discharge status: Home / Self Care | Payer: Medicare Other | Source: Ambulatory Visit | Attending: Student in an Organized Health Care Education/Training Program | Admitting: Student in an Organized Health Care Education/Training Program

## 2024-01-28 DIAGNOSIS — M5412 Radiculopathy, cervical region: Secondary | ICD-10-CM | POA: Insufficient documentation

## 2024-01-28 DIAGNOSIS — Z981 Arthrodesis status: Secondary | ICD-10-CM | POA: Insufficient documentation

## 2024-01-28 DIAGNOSIS — M542 Cervicalgia: Secondary | ICD-10-CM | POA: Insufficient documentation

## 2024-02-01 NOTE — Telephone Encounter (Signed)
I will update Dr. Jenene Slicker to see from Dr. Ronne Binning about ASA.

## 2024-02-02 ENCOUNTER — Ambulatory Visit: Payer: Medicare Other | Admitting: Internal Medicine

## 2024-02-03 NOTE — Telephone Encounter (Signed)
 I will forward this to preop APP.

## 2024-02-03 NOTE — Telephone Encounter (Signed)
 Will update the requesting office to see notes from preop APP today. Pt will need to keep her appt with Dr. Rodolfo Clan. Pt has no showed for 2 appts for preop clearance.

## 2024-02-03 NOTE — Telephone Encounter (Signed)
   Name: [name]  DOB: March 24, 1961  MRN: [id number]  Primary Cardiologist: Stanly DELENA Leavens, MD  Chart reviewed as part of pre-operative protocol coverage. Because of [name]'s past medical history and time since last visit, she will require a follow-up in-office visit in order to better assess preoperative cardiovascular risk. Patient was a no show for preoperative  appointment with Dr. Fernande regarding palpitations on 01/24/24 and with Dr. Mallipeddi on 02/02/24. She has been rescheduled to see Dr. Fernande on 02/25/24.  Patient cannot have preoperative cardiovascular risk evaluation completed until seen in office, please notify requesting office and patient.   Pre-op covering staff: - Please schedule appointment and call patient to inform them. If patient already had an upcoming appointment within acceptable timeframe, please add pre-op clearance to the appointment notes so provider is aware. - Please contact requesting surgeon's office via preferred method (i.e, phone, fax) to inform them of need for appointment prior to surgery.  Indiana Pechacek D Maxtyn Nuzum, NP  02/03/2024, 12:43 PM

## 2024-02-04 ENCOUNTER — Other Ambulatory Visit: Payer: Self-pay | Admitting: Family Medicine

## 2024-02-04 NOTE — Progress Notes (Signed)
 Dr Claretta Croft is aware of ED visit to [facility]. He reviewed results of Echo.  No orders given.

## 2024-02-04 NOTE — Patient Instructions (Signed)
 Your procedure is scheduled on: 02/10/2024  Report to [facility] Main Entrance at  9:30   AM.  Call this number if you have problems the morning of surgery: [phone]   Remember:   Do not Eat after midnight, You may have CLEAR liquids until 7:30 am; Water , soda, tea, Black coffee NO CREAM  Hold Xarelto  as instructed by office        No Smoking the morning of surgery  :  Take these medicines the morning of surgery with A SIP OF WATER : xanax , cymbalta , flexeril , levothyroxine , metoprolol , and omeprazole    Do not wear jewelry, make-up or nail polish.  Do not wear lotions, powders, or perfumes. You may wear deodorant.  Do not shave 48 hours prior to surgery. Men may shave face and neck.  Do not bring valuables to the hospital.  Contacts, dentures or bridgework may not be worn into surgery.  Leave suitcase in the car. After surgery it may be brought to your room.  For patients admitted to the hospital, checkout time is 11:00 AM the day of discharge.   Patients discharged the day of surgery will not be allowed to drive home.    Special Instructions: Shower using CHG night before surgery and shower the day of surgery use CHG.  Use special wash - you have one bottle of CHG for all showers.  You should use approximately 1/2 of the bottle for each shower. How to Use Chlorhexidine  at Home in the Shower Chlorhexidine  gluconate (CHG) is a germ-killing (antiseptic) wash that's used to clean the skin. It can get rid of the germs that normally live on the skin and can keep them away for about 24 hours. If you're having surgery, you may be told to shower with CHG at home the night before surgery. This can help lower your risk for infection. To use CHG wash in the shower, follow the steps below. Supplies needed: CHG body wash. Clean washcloth. Clean towel. How to use CHG in the shower Follow these steps unless you're told to use CHG in a different way: Start the shower. Use your normal soap and  shampoo to wash your face and hair. Turn off the shower or move out of the shower stream. Pour CHG onto a clean washcloth. Do not use any type of brush or rough sponge. Start at your neck, washing your body down to your toes. Make sure you: Wash the part of your body where the surgery will be done for at least 1 minute. Do not scrub. Do not use CHG on your head or face unless your health care provider tells you to. If it gets into your ears or eyes, rinse them well with water . Do not wash your genitals with CHG. Wash your back and under your arms. Make sure to wash skin folds. Let the CHG sit on your skin for 1-2 minutes or as long as told. Rinse your entire body in the shower, including all body creases and folds. Turn off the shower. Dry off with a clean towel. Do not put anything on your skin afterward, such as powder, lotion, or perfume. Put on clean clothes or pajamas. If it's the night before surgery, sleep in clean sheets. General tips Use CHG only as told, and follow the instructions on the label. Use the full amount of CHG as told. This is often one bottle. Do not smoke and stay away from flames after using CHG. Your skin may feel sticky after using CHG. This  is normal. The sticky feeling will go away as the CHG dries. Do not use CHG: If you have a chlorhexidine  allergy or have reacted to chlorhexidine  in the past. On open wounds or areas of skin that have broken skin, cuts, or scrapes. On babies younger than 37 months of age. Contact a health care provider if: You have questions about using CHG. Your skin gets irritated or itchy. You have a rash after using CHG. You swallow any CHG. Call your local poison control center [phone] in the [place] Your eyes itch badly, or they become very red or swollen. Your hearing changes. You have trouble seeing. If you can't reach your provider, go to an urgent care or emergency room. Do not drive yourself. Get help right away if: You  have swelling or tingling in your mouth or throat. You make high-pitched whistling sounds when you breathe, most often when you breathe out (wheeze). You have trouble breathing. These symptoms may be an emergency. Call 911 right away. Do not wait to see if the symptoms will go away. Do not drive yourself to the hospital. This information is not intended to replace advice given to you by your health care provider. Make sure you discuss any questions you have with your health care provider. Document Revised: 06/29/2023 Document Reviewed: 06/25/2022 Elsevier Patient Education  2024 [employer] Inc. Cystoscopy A cystoscopy may be done to find or treat a condition in your lower urinary tract. Your lower urinary tract includes your bladder and urethra. The urethra is the part of your body that drains pee (urine) from your bladder. You may need this procedure if: You have: Urinary tract infections (UTIs) that keep coming back. Blood in your pee. Pain when you pee. A blockage in your urethra, such as a urinary stone. You can't control when you pee, or you have to pee a lot. There are cells that aren't normal in your pee sample. A problem is found in your bladder during a test. You need a biopsy. This is when a small piece of tissue is removed for testing. Tell a health care provider about: Any allergies you have. All medicines you take. These include vitamins, herbs, eye drops, and creams. Any problems you or family members have had with anesthesia. Any bleeding problems you have. Any surgeries you've had. Any medical conditions you have. Whether you're pregnant or may be pregnant. What are the risks? Your health care provider will talk with you about risks. These may include: Infection. Bleeding. Allergic reactions to medicines. Damage to your urethra or bladder. What happens before the procedure? Medicines Ask about changing or stopping: Any medicines you take. Any vitamins, herbs, or  supplements you take. Do not take aspirin  or ibuprofen  unless you're told to. Tests You may have an exam or tests. These may include: Pee tests to check for signs of infection. X-rays of: Your bladder. Your urethra. Your kidneys. A CT scan of your belly or hips. General instructions Eat and drink only as you've been told. For your safety, you may: Need to wash your skin with a soap that kills germs. Get antibiotics. Have hair removed at the procedure site. If you'll be going home right after the procedure, plan to have a responsible adult: Take you home from the hospital or clinic. You won't be allowed to drive. Stay with you for the time you're told. What happens during the procedure?  You may be given: A sedative to help you relax. Anesthesia to keep you from feeling  pain. The opening of your urethra will be cleaned. A thin tube called a cystoscope will be put into your urethra. The tube has a light and camera on the end of it. The tube will be passed into your bladder. A germ-free (sterile) fluid will flow through the tube. The fluid will stretch your bladder. This helps your provider see the walls of your bladder more clearly. A biopsy may be taken. Stones may be removed. The tube will be taken out. Your bladder will be emptied. The procedure may vary among providers and hospitals. What can I expect after the procedure? It's common to have: Some soreness or pain in your belly and urethra. Mild pain or burning when you pee. The pain should stop a few minutes after you pee. This may last for up to a week. A small amount of blood in your pee for a few days. A feeling like you need to pee often. But when you do, you may only pee a little. Follow these instructions at home: Medicines Take your medicines only as told. If you were given antibiotics, take them as told. Do not stop taking them even if you start to feel better. General instructions If you were given a sedative,  do not drive or use machines until you're told it's safe. A sedative can make you sleepy. Eat and drink as told. If a biopsy was taken, ask when your test results will be ready and how to get them. You may need to call or meet with your provider to get your results. Ask what things are safe for you to do at home. Ask when you can go back to work or school. Contact a health care provider if: Your pain gets worse. Your pain doesn't get better with medicine. You have trouble peeing. You have more blood in your pee. You have a fever or chills. Get help right away if: You have blood clots in your pee. You can't pee. This information is not intended to replace advice given to you by your health care provider. Make sure you discuss any questions you have with your health care provider. Document Revised: 04/20/2023 Document Reviewed: 04/20/2023 Elsevier Patient Education  2024 Else[employer] Inc. General Anesthesia, Adult, Care After The following information offers guidance on how to care for yourself after your procedure. Your health care provider may also give you more specific instructions. If you have problems or questions, contact your health care provider. What can I expect after the procedure? After the procedure, it is common for people to: Have pain or discomfort at the IV site. Have nausea or vomiting. Have a sore throat or hoarseness. Have trouble concentrating. Feel cold or chills. Feel weak, sleepy, or tired (fatigue). Have soreness and body aches. These can affect parts of the body that were not involved in surgery. Follow these instructions at home: For the time period you were told by your health care provider:  Rest. Do not participate in activities where you could fall or become injured. Do not drive or use machinery. Do not drink alcohol. Do not take sleeping pills or medicines that cause drowsiness. Do not make important decisions or sign legal documents. Do not take care  of children on your own. General instructions Drink enough fluid to keep your urine pale yellow. If you have sleep apnea, surgery and certain medicines can increase your risk for breathing problems. Follow instructions from your health care provider about wearing your sleep device: Anytime you are sleeping, including during  daytime naps. While taking prescription pain medicines, sleeping medicines, or medicines that make you drowsy. Return to your normal activities as told by your health care provider. Ask your health care provider what activities are safe for you. Take over-the-counter and prescription medicines only as told by your health care provider. Do not use any products that contain nicotine or tobacco. These products include cigarettes, chewing tobacco, and vaping devices, such as e-cigarettes. These can delay incision healing after surgery. If you need help quitting, ask your health care provider. Contact a health care provider if: You have nausea or vomiting that does not get better with medicine. You vomit every time you eat or drink. You have pain that does not get better with medicine. You cannot urinate or have bloody urine. You develop a skin rash. You have a fever. Get help right away if: You have trouble breathing. You have chest pain. You vomit blood. These symptoms may be an emergency. Get help right away. Call 911. Do not wait to see if the symptoms will go away. Do not drive yourself to the hospital. Summary After the procedure, it is common to have a sore throat, hoarseness, nausea, vomiting, or to feel weak, sleepy, or fatigue. For the time period you were told by your health care provider, do not drive or use machinery. Get help right away if you have difficulty breathing, have chest pain, or vomit blood. These symptoms may be an emergency. This information is not intended to replace advice given to you by your health care provider. Make sure you discuss any  questions you have with your health care provider. Document Revised: 03/13/2022 Document Reviewed: 03/13/2022 Elsevier Patient Education  2024 [employer].

## 2024-02-07 ENCOUNTER — Encounter (HOSPITAL_COMMUNITY)
Admission: RE | Admit: 2024-02-07 | Discharge: 2024-02-07 | Disposition: A | Discharge status: Home / Self Care | Payer: Medicare Other | Source: Ambulatory Visit | Attending: Urology | Admitting: Urology

## 2024-02-08 ENCOUNTER — Ambulatory Visit: Payer: Medicare Other | Admitting: Urology

## 2024-02-10 ENCOUNTER — Encounter: Payer: Self-pay | Admitting: Family Medicine

## 2024-02-11 MED ORDER — ACYCLOVIR 800 MG PO TABS: 800.0000 mg | ORAL_TABLET | Freq: Every day | ORAL | 1 refills | Status: DC

## 2024-02-18 ENCOUNTER — Inpatient Hospital Stay: Payer: Self-pay | Admitting: Internal Medicine

## 2024-02-21 ENCOUNTER — Encounter: Payer: Self-pay | Admitting: Internal Medicine

## 2024-02-23 ENCOUNTER — Other Ambulatory Visit (HOSPITAL_COMMUNITY): Payer: Medicare Other

## 2024-02-25 ENCOUNTER — Ambulatory Visit: Payer: Medicare Other | Admitting: Internal Medicine

## 2024-02-28 ENCOUNTER — Ambulatory Visit: Payer: Self-pay | Admitting: Internal Medicine

## 2024-02-28 ENCOUNTER — Ambulatory Visit: Payer: Self-pay | Admitting: Family Medicine

## 2024-02-28 NOTE — Telephone Encounter (Signed)
 Copied from CRM [id number]. Topic: Clinical - Red Word Triage >> Feb 28, 2024 12:12 PM Martha Clan wrote: Red Word that prompted transfer to Nurse Triage: Had GI surgery and bowel has reopened. Has fluid in stomach. Pain. Possible abscess.   Chief Complaint: Abdominal Discomfort,  Incision Site Drainage Symptoms: Abdominal Pain,  Frequency: Acute Pertinent Negatives: Patient denies fever Disposition: [] ED /[] Urgent Care (no appt availability in office) / [x] Appointment(In office/virtual)/ []  [facility]/ [] Home Care/ [] Refused Recommended Disposition /[] [facility]/ []  Follow-up with PCP Additional Notes: SW is being triaged for The patient has a history of multiple abdominal surgeries and trauma. The patient is on Linzess and has chronic diarrhea. Patient has additional symptoms of abdominal distension and hardness. The patient has extensive scar tissue due to her history and reports that this presentation is similar to when she had the abscess that caused her multiple episodes of sepsis. The patient reports the drainage and pain is right below the umbilical-associated area. The last measured temperature was 98.9, and the patient reports back pain as well. Earliest in office appointment with another provider acquired for thorough evaluation and treatment based upon presentation and symptoms.   Patient is also asking for contacting of her Gastroenterologist.   Reason for Disposition  [1] Clear or blood-tinged fluid draining from wound AND [2] no fever  Answer Assessment - Initial Assessment Questions 1. SYMPTOM: "What's the main symptom you're concerned about?" (e.g., drainage, incision opening up, pain, redness)     Drainage-Yellow, Pink  2. ONSET: "When did drainage  start?"     Acute 3. SURGERY: "What surgery did you have?"      Abdominal Surgery, in 2016 not recent  4. DATE of SURGERY: "When was the surgery?"      2016  5. INCISION SITE: "Where is the incision  located?"      Abdomen  6. REDNESS: "Is there any redness at the incision site?" If Yes, ask: "How wide across is the redness?" (Inches, centimeters)      Redness, Raw in appearance  7. PAIN: "Is there any pain?" If Yes, ask: "How bad is it?"  (Scale 1-10; or mild, moderate, severe)   - NONE (0): no pain   - MILD (1-3): doesn't interfere with normal activities    - MODERATE (4-7): interferes with normal activities or awakens from sleep    - SEVERE (8-10): excruciating pain, unable to do any normal activities     Burning, 9  8. BLEEDING: "Is there any bleeding?" If Yes, ask: "How much?" and "Where?"     No  9. DRAINAGE: "Is there any drainage from the incision site?" If Yes, ask: "What color and how much?" (e.g., red, cloudy, pus; drops, teaspoon)     Yellow and Pink  10. FEVER: "Do you have a fever?" If Yes, ask: "What is your temperature, how was it measured, and when did it start?"       No  11. OTHER SYMPTOMS: "Do you have any other symptoms?" (e.g., dizziness, rash elsewhere on body, shaking chills, weakness)       Back Pain, Chills, Redness, Nausea, Dry Heaving  Protocols used: Post-Op Incision Symptoms and Questions-A-AH

## 2024-02-29 ENCOUNTER — Encounter: Payer: Self-pay | Admitting: Family Medicine

## 2024-02-29 ENCOUNTER — Ambulatory Visit: Payer: Medicare Other | Attending: Emergency Medicine | Admitting: Emergency Medicine

## 2024-02-29 ENCOUNTER — Inpatient Hospital Stay: Payer: Medicare Other

## 2024-02-29 NOTE — Telephone Encounter (Signed)
 I will update the requesting office the pt no showed for her appt today. Pt cancelled her appt 03/15/24 and rescheduled to 02/29/24, though pt no showed for the appt today 02/29/24.   Pt need to call the office and reschedule her appt in office for preop clearance.   I will update the surgeon's office.

## 2024-02-29 NOTE — Telephone Encounter (Signed)
 No show for appointment today.

## 2024-02-29 NOTE — Progress Notes (Deleted)
 Cardiology Office Note:    Date:  02/29/2024  ID:  [name], DOB 06-12-1961, MRN [id number] PCP: Kerri Perches, MD  [facility] Providers Cardiologist:  Christell Constant, MD Electrophysiologist:  Sherryl Manges, MD { Click to update primary MD,subspecialty MD or APP then REFRESH:1}    {Click to Open Review  :1}   Patient Profile:      Chief Complaint: *** History of Present Illness:  [name] is a 63 y.o. female with visit-pertinent history of palpitations, hyperlipidemia, chest pain, prior history of DVT, history of PE in 1990s, s/p IVC filter in 2006, SVT, questionable A-fib/a flutter, thyroid disease, Sjogrens, fibromyalgia, IDA, history of stroke, migraines, breast cancer, syncope, ovarian cancer  Remote self-reported history of ablations in 1990s.  Previously followed by Dr. Graciela Husbands.  In the past heart rates were found to be in 200-300 range and was sent to [facility] for further evaluation.  Records show history of "tachycardia ablation."  Seen by EP in 2016 through 2017 for single episode and atypical chest pain.  Syncopal episode was felt to be vasovagal.  Stress test in 2016 was negative for ischemia.  Last seen by EP on 08/2020.  In addition to history above, patient had remote history of PE in late 1990s.  Had hemorrhaging during treatment for breast cancer, therefore IVC filter was placed in 2006.  Her [place] filter eroded through her vena cava, was beginning to ossify on her spine.  Was evaluated in 2019 for preop clearance for its removal.  She has history of chest pain, CCTA 12/2018 revealed calcium score 0.  Repeat study CTA 02/2022 showed repeat calcium score of 0.  She underwent peripheral vascular catheterization on 09/16/2023 by Dr. Randie Heinz.  This is completed because she had undergone evaluation for right upper extremity swelling and numbness along with tingling of her fingers.  With concern for thoracic outlet syndrome, was indicated for  venogram of right upper extremity.  She had a procedure as well that one of her brachial veins in her basilic vein was likely chronically thrombosed due to previous PICC line placement.  Was also felt that her history of cervical neck injury and spinal instrumentation may be causing some of her symptoms as well.  Successful balloon angioplasty performed to right subclavian and axillary veins.  She was last seen in clinic on 10/04/2023.  She had noted that her thoracic outlet syndrome been causing her episodes of chest pain.  She also notes continued palpitations and has been taking an extra 25 mg of metoprolol succinate in the evening.  Chest pain  Palpitations  Hyperlipidemia  History of DVT   Discussed the use of AI scribe software for clinical note transcription with the patient, who gave verbal consent to proceed.  History of Present Illness            Review of systems:  Please see the history of present illness. All other systems are reviewed and otherwise negative. ***     Home Medications:    No outpatient medications have been marked as taking for the 02/29/24 encounter (Appointment) with Denyce Robert, NP.   Current Facility-Administered Medications for the 02/29/24 encounter (Appointment) with Denyce Robert, NP  Medication   methylPREDNISolone acetate (DEPO-MEDROL) injection 40 mg   Studies Reviewed:       *** Risk Assessment/Calculations:   {Does this patient have ATRIAL FIBRILLATION?:[id number]} No BP recorded.  {Refresh Note OR Click here to enter BP  :1}***  Physical Exam:   VS:  There were no vitals taken for this visit.   Wt Readings from Last 3 Encounters:  01/18/24 187 lb (84.8 kg)  01/04/24 187 lb (84.8 kg)  12/20/23 193 lb (87.5 kg)    GEN: Well nourished, well developed in no acute distress NECK: No JVD; No carotid bruits CARDIAC: ***RRR, no murmurs, rubs, gallops RESPIRATORY:  Clear to auscultation without rales, wheezing or  rhonchi  ABDOMEN: Soft, non-tender, non-distended EXTREMITIES:  No edema; No acute deformity ***     Assessment and Plan:  Assessment and Plan              {Are you ordering a CV Procedure (e.g. stress test, cath, DCCV, TEE, etc)?   Press F2        :[id number]}  Dispo:  No follow-ups on file.  Signed, Denyce Robert, NP

## 2024-03-01 ENCOUNTER — Ambulatory Visit: Payer: Medicare Other | Admitting: Vascular Surgery

## 2024-03-01 ENCOUNTER — Ambulatory Visit: Payer: Medicare Other | Admitting: Student in an Organized Health Care Education/Training Program

## 2024-03-02 ENCOUNTER — Ambulatory Visit: Payer: Self-pay | Admitting: Family Medicine

## 2024-03-02 ENCOUNTER — Inpatient Hospital Stay: Payer: Medicare Other

## 2024-03-02 ENCOUNTER — Ambulatory Visit: Payer: Medicare Other | Admitting: Urology

## 2024-03-02 NOTE — Telephone Encounter (Signed)
 Attempted to contact Tobi Bastos from [employer] without success. Routing to clinic.   Copied from CRM [id number]. Topic: Clinical - Medication Question >> Mar 02, 2024  3:54 PM Alessandra Bevels wrote: Reason for CRM: Tobi Bastos calling from [facility] Information is calling to ask did the patient have an adverse reaction to ALPRAZolam Prudy Feeler) 1 MG tablet [[id number]]? Please advise CB- [phone]

## 2024-03-03 ENCOUNTER — Other Ambulatory Visit: Payer: Self-pay | Admitting: Family Medicine

## 2024-03-03 ENCOUNTER — Inpatient Hospital Stay: Payer: Self-pay | Admitting: Family Medicine

## 2024-03-03 NOTE — Telephone Encounter (Signed)
 Her xanax was refilled at a [facility]. I'm not sure who these people are but will not be disclosing any pt information to them

## 2024-03-06 ENCOUNTER — Other Ambulatory Visit (HOSPITAL_COMMUNITY): Payer: Medicare Other

## 2024-03-07 ENCOUNTER — Inpatient Hospital Stay: Payer: Medicare Other | Admitting: Physician Assistant

## 2024-03-10 NOTE — Progress Notes (Deleted)
 Name: [name] DOB: [birth year] MRN: [id number]  History of Present Illness: [name] is a 63 y.o. female who presents today for follow up visit at [facility].  ***She is accompanied by ***. Interstitial cystitis with urgency / frequency. - Previously seen by [name] and [name] >20 years ago. Pelvic pain. Vaginal atrophy. Urethral stricture. Multiple prior urethral dilation procedures since childhood.  Complex GI / surgical history. Per GI note on 08/09/2023: "partial colectomy due to redundant colon with primary anastomosis, perforated bowel r/t gynecologic procedure complicated by enterocutaneous fistula that required multiple surgeries and multiple SBOs with recent admission for partial SBO 10/25/21, gastroparesis, small bowel dysmotility, history of pulmonary embolism and DVT, who presents for follow up of small bowel dysmotility and gastroparesis." Constipation. Chronic abdominal pain. Previously seen at "[facility] for consideration of small bowel transplantation".   At 1st visit with [name] on 11/18/2023: > "I have reviewed her CT films and there is some anterior compression of her bladder and she appears to have pelvic lipomatosis." > Noted on pelvic exam:  - Moderate introital stenosis. - Mod/severe vaginal mucosal atrophy. - Bladder tender without mass. - Lateral pelvic side wall tenderness.  > The plan was:  1. Continue topical estrogen and lidocaine.  2. Consider pelvic floor physical therapy. 3. Trial 10mg  vaginal valium tabs every other day.  4. Surgery: Cystoscopy with hydrodistention and urethral dilation to reassess her anatomy.    Since last visit: Surgery was delayed for cardiac clearance; scheduled with [name] on ***05/04/2024.   Today: She presents for preop visit.  ***UA  She {Actions; denies-reports:120008} bladder pain***which is described as ***constant ***intermittent and {Mild/Moderate/Severe:20260} on  average. She {Actions; denies-reports:120008} urinary ***frequency, ***nocturia, and ***urgency. Voiding ***x/day and ***x/night on average.  She {Actions; denies-reports:120008} dysuria, gross hematuria, straining to void, or sensations of incomplete emptying.   Medications: Current Outpatient Medications  Medication Sig Dispense Refill   acyclovir (ZOVIRAX) 800 MG tablet Take 1 tablet (800 mg total) by mouth 5 (five) times daily. 50 tablet 1   ALPRAZolam (XANAX) 1 MG tablet Take 1 tablet (1 mg total) by mouth 2 (two) times daily as needed for anxiety. (Patient taking differently: Take 1 mg by mouth 2 (two) times daily.) 60 tablet 5   Ascorbic Acid (VITAMIN C) 250 MG CHEW Chew 500 mg by mouth daily.     aspirin 81 MG chewable tablet Chew 81 mg by mouth daily.     butalbital-acetaminophen-caffeine (FIORICET) 50-325-40 MG tablet Take 1 tablet by mouth every 6 (six) hours as needed for headache.     conjugated estrogens (PREMARIN) vaginal cream INSERT 1 APPLICATORFUL VAGINALLY DAILY 30 g 2   cyanocobalamin (VITAMIN B12) 1000 MCG/ML injection ADMINISTER 1 ML(1000 MCG) IN THE MUSCLE EVERY 30 DAYS 1 mL 11   cyclobenzaprine (FLEXERIL) 10 MG tablet Take 1 tablet (10 mg total) by mouth 2 (two) times daily as needed for muscle spasms. (Patient taking differently: Take 10 mg by mouth 2 (two) times daily.) 90 tablet 3   cycloSPORINE (RESTASIS) 0.05 % ophthalmic emulsion Place 1 drop into both eyes 2 (two) times daily as needed (dry eyes).     DULoxetine (CYMBALTA) 60 MG capsule Take 1 capsule (60 mg total) by mouth daily. 30 capsule 3   ezetimibe (ZETIA) 10 MG tablet TAKE 1 TABLET(10 MG) BY MOUTH DAILY (Patient taking differently: Take 10 mg by mouth at bedtime.) 90 tablet 3   fenofibrate (TRICOR) 48 MG tablet TAKE 1  TABLET(48 MG) BY MOUTH DAILY (Patient taking differently: Take 48 mg by mouth at bedtime.) 30 tablet 5   furosemide (LASIX) 20 MG tablet TAKE 1 TABLET(20 MG) BY MOUTH DAILY (Patient taking  differently: Take 20 mg by mouth 2 (two) times daily.) 30 tablet 3   HYDROmorphone (DILAUDID) 4 MG tablet Take 4 mg by mouth 3 (three) times daily.     INTRAROSA 6.5 MG INST Place 1 suppository vaginally at bedtime.     Lactobacillus (CULTURELLE DIGESTIVE WOMENS PO) Take 1 capsule by mouth daily.     levothyroxine (SYNTHROID) 125 MCG tablet TAKE 1 TABLET(125 MCG) BY MOUTH DAILY BEFORE BREAKFAST 90 tablet 1   linaclotide (LINZESS) 290 MCG CAPS capsule Take 1 capsule (290 mcg total) by mouth daily before breakfast. 30 capsule 11   magnesium oxide (MAG-OX) 400 (240 Mg) MG tablet TAKE 1 TABLET BY MOUTH FOUR TIMES DAILY 180 tablet 0   MegaRed Omega-3 Krill Oil 500 MG CAPS Take 500 mg by mouth at bedtime.     Melatonin 10 MG TABS Take 10 mg by mouth at bedtime.     metFORMIN (GLUCOPHAGE-XR) 500 MG 24 hr tablet Take 500 mg by mouth 2 (two) times daily with a meal.     metoprolol succinate (TOPROL-XL) 25 MG 24 hr tablet TAKE 1 TABLET(25 MG) BY MOUTH DAILY (Patient taking differently: Take 25 mg by mouth 2 (two) times daily as needed (heart racing).) 90 tablet 2   Multiple Vitamins-Minerals (ICAPS AREDS 2 PO) Take 1 capsule by mouth at bedtime.     naloxone (NARCAN) nasal spray 4 mg/0.1 mL Place 1 spray into the nose as needed (opioid reversal).     omeprazole (PRILOSEC) 40 MG capsule TAKE 1 CAPSULE(40 MG) BY MOUTH IN THE MORNING AND AT BEDTIME 180 capsule 3   OVER THE COUNTER MEDICATION Take 2 tablets by mouth 2 (two) times daily. Nutra-ful     OVER THE COUNTER MEDICATION Take 1 capsule by mouth at bedtime. Blink Eye Supplement for Dry eyes     phenazopyridine (PYRIDIUM) 95 MG tablet Take by mouth 3 (three) times daily as needed. sd     Polyethyl Glycol-Propyl Glycol (SYSTANE ULTRA) 0.4-0.3 % SOLN Place 1 drop into both eyes as needed (dry eyes).     polyethylene glycol (MIRALAX / GLYCOLAX) 17 g packet Take 17 g by mouth daily as needed for mild constipation or moderate constipation.     potassium  chloride (KLOR-CON M) 10 MEQ tablet TAKE 1 TABLET(10 MEQ) BY MOUTH TWICE DAILY 180 tablet 3   pravastatin (PRAVACHOL) 20 MG tablet TAKE 1 TABLET(20 MG) BY MOUTH DAILY (Patient taking differently: Take 20 mg by mouth at bedtime.) 90 tablet 1   promethazine (PHENERGAN) 12.5 MG tablet Take 12.5 mg by mouth every 6 (six) hours as needed for nausea or vomiting.     sennosides-docusate sodium (SENOKOT-S) 8.6-50 MG tablet Take 2 tablets by mouth at bedtime.     Vitamin D, Ergocalciferol, (DRISDOL) 1.25 MG (50000 UNIT) CAPS capsule Take 50,000 Units by mouth every 30 (thirty) days.     XARELTO 20 MG TABS tablet TAKE 1 TABLET(20 MG) BY MOUTH DAILY 30 tablet 3   Current Facility-Administered Medications  Medication Dose Route Frequency Provider Last Rate Last Admin   methylPREDNISolone acetate (DEPO-MEDROL) injection 40 mg  40 mg Intramuscular Once         Allergies: Allergies  Allergen Reactions   Nitrofurantoin Hives   Crestor [Rosuvastatin Calcium] Other (See Comments)  Generalized cramps   Bisacodyl Other (See Comments)    Makes patient feel like she is having cramps    Codeine Itching   Iron Sucrose Other (See Comments)    Flushing- required benadryl and solu medrol   Monosodium Glutamate Other (See Comments)    Cluster migraines    Scopolamine Hbr Other (See Comments)    Cluster migraines, impaired vision   Morphine Rash   Other Hives, Itching, Swelling, Rash and Other (See Comments)    Cigarette smoke Causes Migraines     Past Medical History:  Diagnosis Date   Abdominal hernia 08/14/2019   Abdominal pain 06/06/2019   Acute bronchitis 05/01/2013   Acute cystitis 06/09/2010   Qualifier: Diagnosis of  By: Lillia Mountain LPN, Brandi     Acute renal insufficiency 08/28/2011   Allergy    Phreesia 03/16/2021   Anemia    Phreesia 10/29/2020   Anxiety    Anxiety state 03/16/2008   Qualifier: Diagnosis of  By: Lind Guest     Arthritis    Phreesia 10/29/2020   Back pain with  radiation 07/17/2014   Bilateral hand pain 07/27/2016   Blood transfusion without reported diagnosis    Phreesia 10/29/2020   Breast cancer (HCC)    L breast- 2006   Cancer (HCC)    Phreesia 10/29/2020   Carpal tunnel syndrome    Chronic constipation    Chronic pain syndrome    followed by [facility]---  back   Clotting disorder (HCC)    Phreesia 10/29/2020   Depression    Dermatitis 12/15/2011   Difficult intubation    Educated about COVID-19 virus infection 05/14/2019   Fall at home 01/26/2016   Family history of adverse reaction to anesthesia    MOTHER--- PONV   Fatigue 09/17/2012   Fibromyalgia    FIBROMYALGIA 03/16/2008   Qualifier: Diagnosis of  By: Lind Guest     GERD (gastroesophageal reflux disease)    Headache disorder 01/16/2013   Headache syndrome 01/26/2016   Hip pain, right 07/08/2019   History of MRSA infection    lip abscess   History of ovarian cyst 06/2011   s/p  BSO   History of pulmonary embolus (PE) 1997   post EP with ablation pulmonary veouns for SVT/ Atrial Fib.   History of supraventricular tachycardia    s/p  ablation 1996  and 1997  by dr Graciela Husbands   History of TIA (transient ischemic attack) 1997   post op EP ablation PE   Hyperlipidemia    Hyperlipidemia LDL goal <100 03/16/2008   Qualifier: Diagnosis of  By: Lind Guest     Hypothyroidism    followed by pcp   Incisional hernia    Insomnia 12/15/2011   Intermittent palpitations 08/22/2017   Interstitial cystitis    09-13-2018   per pt last flare-up  May 2019 (followed by pcp)   INTERSTITIAL CYSTITIS 02/17/2011   Qualifier: Diagnosis of  By: Lodema Hong MD, Margaret     Iron deficiency anemia    09-13-2018  PER PT STABLE   Irregular heart rate 07/25/2013   Lipoma of back    upper   Low back pain radiating to right leg 07/04/2019   Low ferritin level 06/14/2014   MDD (major depressive disorder), single episode, in full remission (HCC) 10/27/2017   Medically noncompliant  02/28/2015   Multiple missed appointments, both follow-up appointments and lab appointments.    Metabolic syndrome X 02/10/2011   Qualifier: Diagnosis of  By: Lodema Hong  MD, Claris Che  hBA1c is 5.8 in 02/2013    Migraines    Morbid obesity (HCC) 03/27/2013   Muscle spasm 01/03/2020   Nausea alone 07/17/2014   NECK PAIN, CHRONIC 03/16/2008   Qualifier: Diagnosis of  By: Lind Guest     Normal coronary arteries    a. by CT 12/2018.   Obesity 03/16/2008   Qualifier: Diagnosis of  By: Lind Guest     Oral ulceration 08/23/2014   Presented at 06/14/2014 visit    Other malaise and fatigue 03/16/2008   Centricity Description: FATIGUE, CHRONIC Qualifier: Diagnosis of  By: Lind Guest   Centricity Description: FATIGUE Qualifier: Diagnosis of  By: Garnette Czech PA, Dawn     OVARIAN CYST 12/26/2009   Qualifier: Diagnosis of  By: Lodema Hong MD, Margaret     Paget disease of breast, left [facility])    Paget's disease of breast, left (HCC) 03/16/2008   Qualifier: Diagnosis of  By: Lind Guest  Left diagnosed in 2006 F/h breast cancer x 15 family members   Partial small bowel obstruction (HCC) 07/04/2012   PONV (postoperative nausea and vomiting)    SEVERE   Postsurgical menopause 11/13/2011   Presence of IVC filter 06/06/2019   PSVT (paroxysmal supraventricular tachycardia) (HCC)    HX ABLATION 1996 AND 1997   Recurrent oral herpes simplex infection 10/13/2017   ROM (right otitis media) 01/23/2013   S/P insertion of IVC (inferior vena caval) filter 05/08/2005   [place] (non-retrievable)  /  dx 2019  a leg of filter is protruding thru the vena cava in to right L2 vertebral body (09-13-2018  per pt having surgery to remove filter in [place])   S/P radiofrequency ablation operation for arrhythmia 1996   1996  and 1997,   SVT and Atrial Fib   Sinusitis, chronic 01/26/2016   SMALL BOWEL OBSTRUCTION, HX OF 08/07/2008   Annotation: obstruction w/ adhesions led to partial colectomy Qualifier: Diagnosis  of  By: Minna Merritts     Stroke [facility])    Phreesia 10/29/2020, TIAs in the past   Swelling of hand 09/17/2012   Syncope    Tachycardia 02/03/2010   Qualifier: Diagnosis of  By: Via LPN, Lynn     Thyroid disease    Phreesia 10/29/2020   TOS (thoracic outlet syndrome)    Ulcer 12/15/2011   Urinary frequency 07/18/2014   Vaginitis and vulvovaginitis 05/10/2013   Vitamin D deficiency 05/05/2015   Wears glasses    Past Surgical History:  Procedure Laterality Date   ABDOMINAL HYSTERECTOMY  1987   ANTERIOR CERVICAL DECOMP/DISCECTOMY FUSION  03-07-2002   dr elsner  @[facility]    C 4 -- 5   APPENDECTOMY  1980   AUGMENTATION MAMMAPLASTY Right 2006   BILATERAL SALPINGOOPHORECTOMY  07/24/2011   via Explor. Lap. w/ intraoperative perf. bowel repair   BIOPSY  05/02/2021   Procedure: BIOPSY;  Surgeon: Marguerita Merles, Reuel Boom, MD;  [facility];  Service: Gastroenterology;;  small bowel, mid esophagus, distal esophagus, random colon biopsies   BIOPSY  12/04/2021   Procedure: BIOPSY;  Surgeon: Lanelle Bal, DO;  [facility];  Service: Endoscopy;;   BIOPSY  03/31/2022   Procedure: BIOPSY;  Surgeon: Dolores Frame, MD;  [facility];  Service: Gastroenterology;;   BOTOX INJECTION N/A 03/31/2022   Procedure: BOTOX INJECTION;  Surgeon: Dolores Frame, MD;  [facility];  Service: Gastroenterology;  Laterality: N/A;   BREAST BIOPSY Right 2019   benign  BREAST ENHANCEMENT SURGERY Bilateral 1993   BREAST IMPLANT REMOVAL Bilateral    BREAST SURGERY N/A    Phreesia 10/29/2020   CARDIAC CATHETERIZATION  07-06-2003   dr Smitty Cords brodie   normal coronaries and LVF   CARDIAC ELECTROPHYSIOLOGY STUDY AND ABLATION  1996  and 1997   CARDIOVASCULAR STRESS TEST  11-18-2015   dr Graciela Husbands   normal nuclear study w/ no ischemia/  normal LV function and wall motion , ef 84%   CARPAL TUNNEL RELEASE Right ?   CESAREAN SECTION  1986   CESAREAN SECTION N/A     Phreesia 10/29/2020   CHOLECYSTECTOMY N/A    Phreesia 10/29/2020   COLON SURGERY     COLONOSCOPY WITH PROPOFOL N/A 05/02/2021   Procedure: COLONOSCOPY WITH PROPOFOL;  Surgeon: Dolores Frame, MD;  [facility];  Service: Gastroenterology;  Laterality: N/A;  12:30 PM   CYSTO/  HYDRODISTENTION/  INSTILATION THERAPY  MULTIPLE   ENTEROCUTANEOUS FISTULA CLOSURE  multiple   last one 2015 with small bowel resection   ESOPHAGOGASTRODUODENOSCOPY (EGD) WITH PROPOFOL N/A 05/02/2021   Procedure: ESOPHAGOGASTRODUODENOSCOPY (EGD) WITH PROPOFOL;  Surgeon: Dolores Frame, MD;  [facility];  Service: Gastroenterology;  Laterality: N/A;   ESOPHAGOGASTRODUODENOSCOPY (EGD) WITH PROPOFOL N/A 12/04/2021   Procedure: ESOPHAGOGASTRODUODENOSCOPY (EGD) WITH PROPOFOL;  Surgeon: Lanelle Bal, DO;  [facility];  Service: Endoscopy;  Laterality: N/A;   ESOPHAGOGASTRODUODENOSCOPY (EGD) WITH PROPOFOL N/A 03/31/2022   Procedure: ESOPHAGOGASTRODUODENOSCOPY (EGD) WITH PROPOFOL;  Surgeon: Dolores Frame, MD;  [facility];  Service: Gastroenterology;  Laterality: N/A;  1015   EXPLORATORY LAPAROTOMY INCISIONAL VENTRAL HERNIA REPAIR / RESECTION SMALL BOWEL  11-09-2014   @[facility]    FRACTURE SURGERY N/A    Phreesia 10/29/2020   JOINT REPLACEMENT N/A    Phreesia 10/29/2020   LIPOMA EXCISION Right 09/16/2018   Procedure: EXCISION LIPOMA UPPER BACK;  Surgeon: Berna Bue, MD;  Location: [facility];  Service: General;  Laterality: Right;   MASTECTOMY Left 2006   w/ reconstruction on left (paget's disease)  and right breast augmentation   PERIPHERAL VASCULAR BALLOON ANGIOPLASTY Right 09/13/2023   Procedure: PERIPHERAL VASCULAR BALLOON ANGIOPLASTY;  Surgeon: Maeola Harman, MD;  Location: [facility] INVASIVE CV LAB;  Service: Cardiovascular;  Laterality: Right;  Subclavian and axillary   POLYPECTOMY  05/02/2021   Procedure: POLYPECTOMY;   Surgeon: Dolores Frame, MD;  [facility];  Service: Gastroenterology;;  gastric   POLYPECTOMY  03/31/2022   Procedure: POLYPECTOMY;  Surgeon: Dolores Frame, MD;  [facility];  Service: Gastroenterology;;   Gaspar Bidding DILATION  05/02/2021   Procedure: Gaspar Bidding DILATION;  Surgeon: Dolores Frame, MD;  [facility];  Service: Gastroenterology;;   SMALL INTESTINE SURGERY N/A    Phreesia 10/29/2020   SPINE SURGERY N/A    Phreesia 10/29/2020   TOTAL COLECTOMY  08-04-2002    @[facility]    AND CHOLECYSTECTOMY  (colonic inertia)   TRANSTHORACIC ECHOCARDIOGRAM  02/04/2016   ef 60-65%,  grade 2 diastolic dysfunction/  mild MR   TUBAL LIGATION N/A    Phreesia 03/16/2021   UPPER EXTREMITY VENOGRAPHY N/A 09/13/2023   Procedure: UPPER EXTREMITY VENOGRAPHY;  Surgeon: Maeola Harman, MD;  Location: [facility] INVASIVE CV LAB;  Service: Cardiovascular;  Laterality: N/A;   VENA CAVA FILTER PLACEMENT  05/08/2005   @[facility]    [facility] (non-retrievable)   WIDE EXCISION PERIRECTAL ABSCESSES  09-22-2005   @ [facility]   Family  History  Problem Relation Age of Onset   Congestive Heart Failure Mother    Diabetes Mother    Heart disease Mother    Hypertension Mother    Other Mother        ketoacidosis   Heart disease Father    Hyperlipidemia Father    Hypertension Father    Alcohol abuse Father    Congestive Heart Failure Father    Supraventricular tachycardia Daughter    Colon cancer Maternal Aunt    Breast cancer Maternal Aunt    Breast cancer Maternal Aunt    Congestive Heart Failure Maternal Aunt    Cancer Maternal Aunt    Heart attack Maternal Aunt    Breast cancer Maternal Aunt    Cancer Maternal Aunt    Rheum arthritis Maternal Aunt    Breast cancer Maternal Aunt    Breast cancer Maternal Aunt    Cancer Maternal Uncle        mets   Prostate cancer Maternal Uncle    Bone cancer Maternal Grandfather        mets   Ovarian cancer Cousin 19    Breast cancer Cousin    Social History   Socioeconomic History   Marital status: Married    Spouse name: Not on file   Number of children: Not on file   Years of education: Not on file   Highest education level: 12th grade  Occupational History   Not on file  Tobacco Use   Smoking status: Never   Smokeless tobacco: Never  Vaping Use   Vaping status: Never Used  Substance and Sexual Activity   Alcohol use: No   Drug use: No   Sexual activity: Not Currently    Birth control/protection: Surgical    Comment: hyst  Other Topics Concern   Not on file  Social History Narrative   Not on file   Social Drivers of Health   Financial Resource Strain: Low Risk  (02/03/2024)   Received [facility]   Overall Financial Resource Strain (CARDIA)    Difficulty of Paying Living Expenses: Not hard at all  Recent Concern: Financial Resource Strain - Medium Risk (01/03/2024)   Overall Financial Resource Strain (CARDIA)    Difficulty of Paying Living Expenses: Somewhat hard  Food Insecurity: No Food Insecurity (02/03/2024)   Received from [facility]   Hunger Vital Sign    Worried About Running Out of Food in the Last Year: Never true    Ran Out of Food in the Last Year: Never true  Recent Concern: Food Insecurity - Food Insecurity Present (01/03/2024)   Hunger Vital Sign    Worried About Running Out of Food in the Last Year: Sometimes true    Ran Out of Food in the Last Year: Sometimes true  Transportation Needs: No Transportation Needs (02/03/2024)   Received from [facility]   PRAPARE - Transportation    Lack of Transportation (Medical): No    Lack of Transportation (Non-Medical): No  Physical Activity: Inactive (02/03/2024)   Received from [facility]   Exercise Vital Sign    Days of Exercise per Week: 0 days    Minutes of Exercise per Session: 0 min  Stress: No Stress Concern Present (02/03/2024)   Received from [facility] of Occupational  Health - Occupational Stress Questionnaire    Feeling of Stress : Only a little  Recent Concern: Stress - Stress Concern Present (01/03/2024)  [employer] of Occupational Health - Occupational Stress Questionnaire    Feeling of Stress : Rather much  Social Connections: Socially Integrated (02/03/2024)   Received from [facility]   Social Connection and Isolation Panel [NHANES]    Frequency of Communication with Friends and Family: Twice a week    Frequency of Social Gatherings with Friends and Family: Once a week    Attends Religious Services: 1 to 4 times per year    Active Member of [employer] or Organizations: Yes    Attends Banker Meetings: 1 to 4 times per year    Marital Status: Married  Catering manager Violence: Not At Risk (02/03/2024)   Received from [facility]   Humiliation, Afraid, Rape, and Kick questionnaire    Fear of Current or Ex-Partner: No    Emotionally Abused: No    Physically Abused: No    Sexually Abused: No    SUBJECTIVE  Review of Systems Constitutional: Patient denies any unintentional weight loss or change in strength lntegumentary: Patient denies any rashes or pruritus Cardiovascular: Patient denies chest pain or syncope Respiratory: Patient denies shortness of breath Gastrointestinal: ***Patient {Actions; denies-reports:120008} ***nausea, ***vomiting, ***constipation, ***diarrhea ***As per HPI Musculoskeletal: Patient denies muscle cramps or weakness Neurologic: Patient denies convulsions or seizures Allergic/Immunologic: Patient denies recent allergic reaction(s) Hematologic/Lymphatic: Patient denies bleeding tendencies Endocrine: Patient denies heat/cold intolerance  GU: As per HPI.  OBJECTIVE There were no vitals filed for this visit. There is no height or weight on file to calculate BMI.  Physical Examination Constitutional: No obvious distress; patient is non-toxic appearing  Cardiovascular: No visible lower extremity  edema.  Respiratory: The patient does not have audible wheezing/stridor; respirations do not appear labored  Gastrointestinal: Abdomen non-distended Musculoskeletal: Normal ROM of UEs  Skin: No obvious rashes/open sores  Neurologic: CN 2-12 grossly intact Psychiatric: Answered questions appropriately with normal affect  Hematologic/Lymphatic/Immunologic: No obvious bruises or sites of spontaneous bleeding  UA:  ***positive for *** leukocytes, *** blood, ***nitrites ***Urine microscopy:  ***negative  *** WBC/hpf, *** RBC/hpf, *** bacteria ***with no evidence of UTI ***with no evidence of microscopic hematuria ***otherwise unremarkable ***glucosuria (secondary to ***Jardiance ***Farxiga use)  PVR: *** ml  ASSESSMENT No diagnosis found.  She is doing ***well.  Will continue current medications; refills ***sent.  We agreed to plan for follow up in *** months / ***1 year or sooner if needed. Patient verbalized understanding of and agreement with current plan. All questions were answered.  PLAN Advised the following: Continue current medications as prescribed. 2. ***No follow-ups on file.  No orders of the defined types were placed in this encounter.   It has been explained that the patient is to follow regularly with their PCP in addition to all other providers involved in their care and to follow instructions provided by these respective offices. Patient advised to contact urology clinic if any urologic-pertaining questions, concerns, new symptoms or problems arise in the interim period.  There are no Patient Instructions on file for this visit.  Electronically signed by:  Donnita Falls, MSN, FNP-C, CUNP 03/10/2024 10:59 PM

## 2024-03-13 ENCOUNTER — Other Ambulatory Visit: Payer: Self-pay | Admitting: Family Medicine

## 2024-03-14 ENCOUNTER — Ambulatory Visit: Payer: Medicare Other | Admitting: Urology

## 2024-03-14 ENCOUNTER — Other Ambulatory Visit: Payer: Self-pay | Admitting: Family Medicine

## 2024-03-14 MED ORDER — PREMARIN 0.625 MG/GM VA CREA: TOPICAL_CREAM | VAGINAL | 2 refills | Status: AC

## 2024-03-15 ENCOUNTER — Other Ambulatory Visit (HOSPITAL_COMMUNITY): Payer: Medicare Other

## 2024-03-15 ENCOUNTER — Ambulatory Visit
Payer: Medicare Other | Attending: Student in an Organized Health Care Education/Training Program | Admitting: Student in an Organized Health Care Education/Training Program

## 2024-03-15 ENCOUNTER — Encounter: Payer: Self-pay | Admitting: Student in an Organized Health Care Education/Training Program

## 2024-03-15 ENCOUNTER — Ambulatory Visit: Payer: Medicare Other | Admitting: Physician Assistant

## 2024-03-15 VITALS — BP 119/75 | HR 82 | Temp 97.7°F | Resp 16 | Ht 64.0 in | Wt 199.0 lb

## 2024-03-15 DIAGNOSIS — M542 Cervicalgia: Secondary | ICD-10-CM | POA: Diagnosis present

## 2024-03-15 DIAGNOSIS — M5412 Radiculopathy, cervical region: Secondary | ICD-10-CM | POA: Diagnosis present

## 2024-03-15 DIAGNOSIS — G540 Brachial plexus disorders: Secondary | ICD-10-CM | POA: Insufficient documentation

## 2024-03-15 DIAGNOSIS — M79601 Pain in right arm: Secondary | ICD-10-CM | POA: Insufficient documentation

## 2024-03-15 MED ORDER — DIAZEPAM 5 MG PO TABS
ORAL_TABLET | ORAL | Status: AC
  Filled 2024-03-15: qty 1

## 2024-03-15 MED ORDER — ROPIVACAINE HCL 2 MG/ML IJ SOLN
18.0000 mL | Freq: Once | INTRAMUSCULAR | Status: AC
Start: 2024-03-15 — End: 2024-03-15
  Administered 2024-03-15: 18 mL via PERINEURAL
  Filled 2024-03-15: qty 20

## 2024-03-15 MED ORDER — DIAZEPAM 5 MG PO TABS
5.0000 mg | ORAL_TABLET | ORAL | Status: AC
  Administered 2024-03-15: 5 mg via ORAL

## 2024-03-15 MED ORDER — LIDOCAINE HCL 2 % IJ SOLN
20.0000 mL | Freq: Once | INTRAMUSCULAR | Status: AC
  Administered 2024-03-15: 200 mg
  Filled 2024-03-15: qty 20
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
## 2024-03-15 NOTE — Patient Instructions (Addendum)
 ______________________________________________________________________    Post-Procedure Discharge Instructions  Instructions: Apply ice:  Purpose: This will minimize any swelling and discomfort after procedure.  When: Day of procedure, as soon as you get home. How: Fill a plastic sandwich bag with crushed ice. Cover it with a small towel and apply to injection site. How long: (15 min on, 15 min off) Apply for 15 minutes then remove x 15 minutes.  Repeat sequence on day of procedure, until you go to bed. Apply heat:  Purpose: To treat any soreness and discomfort from the procedure. When: Starting the next day after the procedure. How: Apply heat to procedure site starting the day following the procedure. How long: May continue to repeat daily, until discomfort goes away. Food intake: Start with clear liquids (like water) and advance to regular food, as tolerated.  Physical activities: Keep activities to a minimum for the first 8 hours after the procedure. After that, then as tolerated. Driving: If you have received any sedation, be responsible and do not drive. You are not allowed to drive for 24 hours after having sedation. Blood thinner: (Applies only to those taking blood thinners) You may restart your blood thinner 6 hours after your procedure. Insulin: (Applies only to Diabetic patients taking insulin) As soon as you can eat, you may resume your normal dosing schedule. Infection prevention: Keep procedure site clean and dry. Shower daily and clean area with soap and water. Post-procedure Pain Diary: Extremely important that this be done correctly and accurately. Recorded information will be used to determine the next step in treatment. For the purpose of accuracy, follow these rules: Evaluate only the area treated. Do not report or include pain from an untreated area. For the purpose of this evaluation, ignore all other areas of pain, except for the treated area. After your procedure,  avoid taking a long nap and attempting to complete the pain diary after you wake up. Instead, set your alarm clock to go off every hour, on the hour, for the initial 8 hours after the procedure. Document the duration of the numbing medicine, and the relief you are getting from it. Do not go to sleep and attempt to complete it later. It will not be accurate. If you received sedation, it is likely that you were given a medication that may cause amnesia. Because of this, completing the diary at a later time may cause the information to be inaccurate. This information is needed to plan your care. Follow-up appointment: Keep your post-procedure follow-up evaluation appointment after the procedure (usually 2 weeks for most procedures, 6 weeks for radiofrequencies). DO NOT FORGET to bring you pain diary with you.   Expect: (What should I expect to see with my procedure?) From numbing medicine (AKA: Local Anesthetics): Numbness or decrease in pain. You may also experience some weakness, which if present, could last for the duration of the local anesthetic. Onset: Full effect within 15 minutes of injected. Duration: It will depend on the type of local anesthetic used. On the average, 1 to 8 hours.  From steroids (Applies only if steroids were used): Decrease in swelling or inflammation. Once inflammation is improved, relief of the pain will follow. Onset of benefits: Depends on the amount of swelling present. The more swelling, the longer it will take for the benefits to be seen. In some cases, up to 10 days. Duration: Steroids will stay in the system x 2 weeks. Duration of benefits will depend on multiple posibilities including persistent irritating factors. Side-effects: If  present, they may typically last 2 weeks (the duration of the steroids). Frequent: Cramps (if they occur, drink Gatorade and take over-the-counter Magnesium 450-500 mg once to twice a day); water retention with temporary weight gain;  increases in blood sugar; decreased immune system response; increased appetite. Occasional: Facial flushing (red, warm cheeks); mood swings; menstrual changes. Uncommon: Long-term decrease or suppression of natural hormones; bone thinning. (These are more common with higher doses or more frequent use. This is why we prefer that our patients avoid having any injection therapies in other practices.)  Very Rare: Severe mood changes; psychosis; aseptic necrosis. From procedure: Some discomfort is to be expected once the numbing medicine wears off. This should be minimal if ice and heat are applied as instructed.  Call if: (When should I call?) You experience numbness and weakness that gets worse with time, as opposed to wearing off. New onset bowel or bladder incontinence. (Applies only to procedures done in the spine)  Emergency Numbers: Durning business hours (Monday - Thursday, 8:00 AM - 4:00 PM) (Friday, 9:00 AM - 12:00 Noon): [phone] After hours: [phone] NOTE: If you are having a problem and are unable connect with, or to talk to a provider, then go to your nearest urgent care or emergency department. If the problem is serious and urgent, please call 911. ______________________________________________________________________     Epidural Steroid Injection Patient Information  Description: The epidural space surrounds the nerves as they exit the spinal cord.  In some patients, the nerves can be compressed and inflamed by a bulging disc or a tight spinal canal (spinal stenosis).  By injecting steroids into the epidural space, we can bring irritated nerves into direct contact with a potentially helpful medication.  These steroids act directly on the irritated nerves and can reduce swelling and inflammation which often leads to decreased pain.  Epidural steroids may be injected anywhere along the spine and from the neck to the low back depending upon the location of your pain.   After  numbing the skin with local anesthetic (like Novocaine), a small needle is passed into the epidural space slowly.  You may experience a sensation of pressure while this is being done.  The entire block usually last less than 10 minutes.  Conditions which may be treated by epidural steroids:  Low back and leg pain Neck and arm pain Spinal stenosis Post-laminectomy syndrome Herpes zoster (shingles) pain Pain from compression fractures  Preparation for the injection:  Do not eat any solid food or dairy products within 8 hours of your appointment.  You may drink clear liquids up to 3 hours before appointment.  Clear liquids include water, black coffee, juice or soda.  No milk or cream please. You may take your regular medication, including pain medications, with a sip of water before your appointment  Diabetics should hold regular insulin (if taken separately) and take 1/2 normal NPH dos the morning of the procedure.  Carry some sugar containing items with you to your appointment. A driver must accompany you and be prepared to drive you home after your procedure.  Bring all your current medications with your. An IV may be inserted and sedation may be given at the discretion of the physician.   A blood pressure cuff, EKG and other monitors will often be applied during the procedure.  Some patients may need to have extra oxygen administered for a short period. You will be asked to provide medical information, including your allergies, prior to the procedure.  We must know immediately if you are taking blood thinners (like Coumadin/Warfarin)  Or if you are allergic to IV iodine contrast (dye). We must know if you could possible be pregnant.  Possible side-effects: Bleeding from needle site Infection (rare, may require surgery) Nerve injury (rare) Numbness & tingling (temporary) Difficulty urinating (rare, temporary) Spinal headache ( a headache worse with upright posture) Light -headedness  (temporary) Pain at injection site (several days) Decreased blood pressure (temporary) Weakness in arm/leg (temporary) Pressure sensation in back/neck (temporary)  Call if you experience: Fever/chills associated with headache or increased back/neck pain. Headache worsened by an upright position. New onset weakness or numbness of an extremity below the injection site Hives or difficulty breathing (go to the emergency room) Inflammation or drainage at the infection site Severe back/neck pain Any new symptoms which are concerning to you  Please note:  Although the local anesthetic injected can often make your back or neck feel good for several hours after the injection, the pain will likely return.  It takes 3-7 days for steroids to work in the epidural space.  You may not notice any pain relief for at least that one week.  If effective, we will often do a series of three injections spaced 3-6 weeks apart to maximally decrease your pain.  After the initial series, we generally will wait several months before considering a repeat injection of the same type.  If you have any questions, please call [phone] [facility]   Procedure instructions  Do not eat or drink fluids (other than water) for 6 hours before your procedure  No water for 2 hours before your procedure  Take your blood pressure medicine with a sip of water  Arrive 30 minutes before your appointment  Carefully read the "Preparing for your procedure" detailed instructions  If you have questions call us at [phone]  _____________________________________________________________________    ______________________________________________________________________  Preparing for your procedure  Appointments: If you think you may not be able to keep your appointment, call 24-48 hours in advance to cancel. We need time to make it available to others.  During your procedure  appointment there will be: No Prescription Refills. No disability issues to discussed. No medication changes or discussions.  Instructions: Food intake: Avoid eating anything solid for at least 8 hours prior to your procedure. Clear liquid intake: You may take clear liquids such as water up to 2 hours prior to your procedure. (No carbonated drinks. No soda.) Transportation: Unless otherwise stated by your physician, bring a driver. Morning Medicines: Except for blood thinners, take all of your other morning medications with a sip of water. Make sure to take your heart and blood pressure medicines. If your blood pressure's lower number is above 100, the case will be rescheduled. Blood thinners: Make sure to stop your blood thinners as instructed.  If you take a blood thinner, but were not instructed to stop it, call our office [phone] and ask to talk to a nurse. Not stopping a blood thinner prior to certain procedures could lead to serious complications. Diabetics on insulin: Notify the staff so that you can be scheduled 1st case in the morning. If your diabetes requires high dose insulin, take only  of your normal insulin dose the morning of the procedure and notify the staff that you have done so. Preventing infections: Shower with an antibacterial soap the morning of your procedure.  Build-up your immune system: Take 1000 mg of Vitamin  C with every meal (3 times a day) the day prior to your procedure. Antibiotics: Inform the nursing staff if you are taking any antibiotics or if you have any conditions that may require antibiotics prior to procedures. (Example: recent joint implants)   Pregnancy: If you are pregnant make sure to notify the nursing staff. Not doing so may result in injury to the fetus, including death.  Sickness: If you have a cold, fever, or any active infections, call and cancel or reschedule your procedure. Receiving steroids while having an infection may result in  complications. Arrival: You must be in the facility at least 30 minutes prior to your scheduled procedure. Tardiness: Your scheduled time is also the cutoff time. If you do not arrive at least 15 minutes prior to your procedure, you will be rescheduled.  Children: Do not bring any children with you. Make arrangements to keep them home. Dress appropriately: There is always a possibility that your clothing may get soiled. Avoid long dresses. Valuables: Do not bring any jewelry or valuables.  Reasons to call and reschedule or cancel your procedure: (Following these recommendations will minimize the risk of a serious complication.) Surgeries: Avoid having procedures within 2 weeks of any surgery. (Avoid for 2 weeks before or after any surgery). Flu Shots: Avoid having procedures within 2 weeks of a flu shots or . (Avoid for 2 weeks before or after immunizations). Barium: Avoid having a procedure within 7-10 days after having had a radiological study involving the use of radiological contrast. (Myelograms, Barium swallow or enema study). Heart attacks: Avoid any elective procedures or surgeries for the initial 6 months after a "Myocardial Infarction" (Heart Attack). Blood thinners: It is imperative that you stop these medications before procedures. Let us know if you if you take any blood thinner.  Infection: Avoid procedures during or within two weeks of an infection (including chest colds or gastrointestinal problems). Symptoms associated with infections include: Localized redness, fever, chills, night sweats or profuse sweating, burning sensation when voiding, cough, congestion, stuffiness, runny nose, sore throat, diarrhea, nausea, vomiting, cold or Flu symptoms, recent or current infections. It is specially important if the infection is over the area that we intend to treat. Heart and lung problems: Symptoms that may suggest an active cardiopulmonary problem include: cough, chest pain, breathing  difficulties or shortness of breath, dizziness, ankle swelling, uncontrolled high or unusually low blood pressure, and/or palpitations. If you are experiencing any of these symptoms, cancel your procedure and contact your primary care physician for an evaluation.  Remember:  Regular Business hours are:  Monday to Thursday 8:00 AM to 4:00 PM  Provider's Schedule: Delano Metz, MD:  Procedure days: Tuesday and Thursday 7:30 AM to 4:00 PM  Edward Jolly, MD:  Procedure days: Monday and Wednesday 7:30 AM to 4:00 PM

## 2024-03-16 ENCOUNTER — Other Ambulatory Visit (HOSPITAL_COMMUNITY): Payer: Medicare Other

## 2024-03-16 ENCOUNTER — Telehealth: Payer: Self-pay | Admitting: *Deleted

## 2024-03-16 NOTE — Telephone Encounter (Signed)
Post procedure call:   no  questions or concerns.  

## 2024-03-17 ENCOUNTER — Ambulatory Visit: Payer: Medicare Other | Admitting: Urology

## 2024-03-17 DIAGNOSIS — N3582 Other urethral stricture, female: Secondary | ICD-10-CM

## 2024-03-17 DIAGNOSIS — N952 Postmenopausal atrophic vaginitis: Secondary | ICD-10-CM

## 2024-03-17 DIAGNOSIS — N3010 Interstitial cystitis (chronic) without hematuria: Secondary | ICD-10-CM

## 2024-03-19 ENCOUNTER — Other Ambulatory Visit: Payer: Self-pay | Admitting: Family Medicine

## 2024-03-19 DIAGNOSIS — Z86711 Personal history of pulmonary embolism: Secondary | ICD-10-CM

## 2024-03-21 ENCOUNTER — Inpatient Hospital Stay

## 2024-03-21 ENCOUNTER — Inpatient Hospital Stay: Attending: Hematology

## 2024-03-22 ENCOUNTER — Encounter: Payer: Self-pay | Admitting: Vascular Surgery

## 2024-03-22 ENCOUNTER — Ambulatory Visit: Payer: Medicare Other | Admitting: Vascular Surgery

## 2024-03-22 VITALS — BP 116/81 | HR 96 | Temp 97.9°F | Ht 64.0 in | Wt 190.0 lb

## 2024-03-22 DIAGNOSIS — G540 Brachial plexus disorders: Secondary | ICD-10-CM

## 2024-03-22 NOTE — Progress Notes (Signed)
 Patient ID: [name], female   DOB: 1961/03/09, 63 y.o.   MRN: [id number]  Reason for Consult: Follow-up   Referred by Kerri Perches, MD  Subjective:     HPI:  [name] is a 63 y.o. female initially evaluated in our office for right upper extremity swelling.  She had a history of a trauma requiring over a month-long admission and at that time had a PICC line in place.  She has undergone balloon venoplasty of the right subclavian and axillary veins and since that time the swelling has resolved.  She continues to have right upper extremity numbness and discoloration and she also has chest pain that radiates into her neck and has been evaluated by cardiology and there is no evidence of coronary disease.  Most recently she has undergone scalene block and she states that after the block she was able to sleep for the first time without any neck and shoulder pain.  She states that the pain is returning as the block was placed 1 week ago and currently she is worried that is going to get back to her baseline which at times is excruciating.  She continues to have discoloration of the hand as well as pins-and-needles feelings but her swelling has resolved.  Past Medical History:  Diagnosis Date   Abdominal hernia 08/14/2019   Abdominal pain 06/06/2019   Acute bronchitis 05/01/2013   Acute cystitis 06/09/2010   Qualifier: Diagnosis of  By: Lillia Mountain LPN, Brandi     Acute renal insufficiency 08/28/2011   Allergy    Phreesia 03/16/2021   Anemia    Phreesia 10/29/2020   Anxiety    Anxiety state 03/16/2008   Qualifier: Diagnosis of  By: Lind Guest     Arthritis    Phreesia 10/29/2020   Back pain with radiation 07/17/2014   Bilateral hand pain 07/27/2016   Blood transfusion without reported diagnosis    Phreesia 10/29/2020   Breast cancer (HCC)    L breast- 2006   Cancer (HCC)    Phreesia 10/29/2020   Carpal tunnel syndrome    Chronic constipation    Chronic  pain syndrome    followed by [facility]---  back   Clotting disorder (HCC)    Phreesia 10/29/2020   Depression    Dermatitis 12/15/2011   Difficult intubation    Educated about COVID-19 virus infection 05/14/2019   Fall at home 01/26/2016   Family history of adverse reaction to anesthesia    MOTHER--- PONV   Fatigue 09/17/2012   Fibromyalgia    FIBROMYALGIA 03/16/2008   Qualifier: Diagnosis of  By: Lind Guest     GERD (gastroesophageal reflux disease)    Headache disorder 01/16/2013   Headache syndrome 01/26/2016   Hip pain, right 07/08/2019   History of MRSA infection    lip abscess   History of ovarian cyst 06/2011   s/p  BSO   History of pulmonary embolus (PE) 1997   post EP with ablation pulmonary veouns for SVT/ Atrial Fib.   History of supraventricular tachycardia    s/p  ablation 1996  and 1997  by dr Graciela Husbands   History of TIA (transient ischemic attack) 1997   post op EP ablation PE   Hyperlipidemia    Hyperlipidemia LDL goal <100 03/16/2008   Qualifier: Diagnosis of  By: Lind Guest     Hypothyroidism    followed by pcp   Incisional hernia    Insomnia 12/15/2011   Intermittent  palpitations 08/22/2017   Interstitial cystitis    09-13-2018   per pt last flare-up  May 2019 (followed by pcp)   INTERSTITIAL CYSTITIS 02/17/2011   Qualifier: Diagnosis of  By: Lodema Hong MD, Margaret     Iron deficiency anemia    09-13-2018  PER PT STABLE   Irregular heart rate 07/25/2013   Lipoma of back    upper   Low back pain radiating to right leg 07/04/2019   Low ferritin level 06/14/2014   MDD (major depressive disorder), single episode, in full remission (HCC) 10/27/2017   Medically noncompliant 02/28/2015   Multiple missed appointments, both follow-up appointments and lab appointments.    Metabolic syndrome X 02/10/2011   Qualifier: Diagnosis of  By: Lodema Hong MD, Margaret  hBA1c is 5.8 in 02/2013    Migraines    Morbid obesity (HCC) 03/27/2013   Muscle spasm  01/03/2020   Nausea alone 07/17/2014   NECK PAIN, CHRONIC 03/16/2008   Qualifier: Diagnosis of  By: Lind Guest     Normal coronary arteries    a. by CT 12/2018.   Obesity 03/16/2008   Qualifier: Diagnosis of  By: Lind Guest     Oral ulceration 08/23/2014   Presented at 06/14/2014 visit    Other malaise and fatigue 03/16/2008   Centricity Description: FATIGUE, CHRONIC Qualifier: Diagnosis of  By: Lind Guest   Centricity Description: FATIGUE Qualifier: Diagnosis of  By: Garnette Czech PA, Dawn     OVARIAN CYST 12/26/2009   Qualifier: Diagnosis of  By: Lodema Hong MD, Margaret     Paget disease of breast, left [facility])    Paget's disease of breast, left (HCC) 03/16/2008   Qualifier: Diagnosis of  By: Lind Guest  Left diagnosed in 2006 F/h breast cancer x 15 family members   Partial small bowel obstruction (HCC) 07/04/2012   PONV (postoperative nausea and vomiting)    SEVERE   Postsurgical menopause 11/13/2011   Presence of IVC filter 06/06/2019   PSVT (paroxysmal supraventricular tachycardia) (HCC)    HX ABLATION 1996 AND 1997   Recurrent oral herpes simplex infection 10/13/2017   ROM (right otitis media) 01/23/2013   S/P insertion of IVC (inferior vena caval) filter 05/08/2005   [place] (non-retrievable)  /  dx 2019  a leg of filter is protruding thru the vena cava in to right L2 vertebral body (09-13-2018  per pt having surgery to remove filter in [place])   S/P radiofrequency ablation operation for arrhythmia 1996   1996  and 1997,   SVT and Atrial Fib   Sinusitis, chronic 01/26/2016   SMALL BOWEL OBSTRUCTION, HX OF 08/07/2008   Annotation: obstruction w/ adhesions led to partial colectomy Qualifier: Diagnosis of  By: Minna Merritts     Stroke [facility])    Phreesia 10/29/2020, TIAs in the past   Swelling of hand 09/17/2012   Syncope    Tachycardia 02/03/2010   Qualifier: Diagnosis of  By: Via LPN, Larita Fife     Thyroid disease    Phreesia 10/29/2020   TOS (thoracic outlet syndrome)     Ulcer 12/15/2011   Urinary frequency 07/18/2014   Vaginitis and vulvovaginitis 05/10/2013   Vitamin D deficiency 05/05/2015   Wears glasses    Family History  Problem Relation Age of Onset   Congestive Heart Failure Mother    Diabetes Mother    Heart disease Mother    Hypertension Mother    Other Mother        ketoacidosis   Heart disease Father  Hyperlipidemia Father    Hypertension Father    Alcohol abuse Father    Congestive Heart Failure Father    Supraventricular tachycardia Daughter    Colon cancer Maternal Aunt    Breast cancer Maternal Aunt    Breast cancer Maternal Aunt    Congestive Heart Failure Maternal Aunt    Cancer Maternal Aunt    Heart attack Maternal Aunt    Breast cancer Maternal Aunt    Cancer Maternal Aunt    Rheum arthritis Maternal Aunt    Breast cancer Maternal Aunt    Breast cancer Maternal Aunt    Cancer Maternal Uncle        mets   Prostate cancer Maternal Uncle    Bone cancer Maternal Grandfather        mets   Ovarian cancer Cousin 19   Breast cancer Cousin    Past Surgical History:  Procedure Laterality Date   ABDOMINAL HYSTERECTOMY  1987   ANTERIOR CERVICAL DECOMP/DISCECTOMY FUSION  03-07-2002   dr elsner  @[facility]    C 4 -- 5   APPENDECTOMY  1980   AUGMENTATION MAMMAPLASTY Right 2006   BILATERAL SALPINGOOPHORECTOMY  07/24/2011   via Explor. Lap. w/ intraoperative perf. bowel repair   BIOPSY  05/02/2021   Procedure: BIOPSY;  Surgeon: Marguerita Merles, Reuel Boom, MD;  [facility];  Service: Gastroenterology;;  small bowel, mid esophagus, distal esophagus, random colon biopsies   BIOPSY  12/04/2021   Procedure: BIOPSY;  Surgeon: Lanelle Bal, DO;  [facility];  Service: Endoscopy;;   BIOPSY  03/31/2022   Procedure: BIOPSY;  Surgeon: Dolores Frame, MD;  [facility];  Service: Gastroenterology;;   BOTOX INJECTION N/A 03/31/2022   Procedure: BOTOX INJECTION;  Surgeon: Dolores Frame, MD;  [facility];  Service: Gastroenterology;  Laterality: N/A;   BREAST BIOPSY Right 2019   benign   BREAST ENHANCEMENT SURGERY Bilateral 1993   BREAST IMPLANT REMOVAL Bilateral    BREAST SURGERY N/A    Phreesia 10/29/2020   CARDIAC CATHETERIZATION  07-06-2003   dr Smitty Cords brodie   normal coronaries and LVF   CARDIAC ELECTROPHYSIOLOGY STUDY AND ABLATION  1996  and 1997   CARDIOVASCULAR STRESS TEST  11-18-2015   dr Graciela Husbands   normal nuclear study w/ no ischemia/  normal LV function and wall motion , ef 84%   CARPAL TUNNEL RELEASE Right ?   CESAREAN SECTION  1986   CESAREAN SECTION N/A    Phreesia 10/29/2020   CHOLECYSTECTOMY N/A    Phreesia 10/29/2020   COLON SURGERY     COLONOSCOPY WITH PROPOFOL N/A 05/02/2021   Procedure: COLONOSCOPY WITH PROPOFOL;  Surgeon: Dolores Frame, MD;  [facility];  Service: Gastroenterology;  Laterality: N/A;  12:30 PM   CYSTO/  HYDRODISTENTION/  INSTILATION THERAPY  MULTIPLE   ENTEROCUTANEOUS FISTULA CLOSURE  multiple   last one 2015 with small bowel resection   ESOPHAGOGASTRODUODENOSCOPY (EGD) WITH PROPOFOL N/A 05/02/2021   Procedure: ESOPHAGOGASTRODUODENOSCOPY (EGD) WITH PROPOFOL;  Surgeon: Dolores Frame, MD;  [facility];  Service: Gastroenterology;  Laterality: N/A;   ESOPHAGOGASTRODUODENOSCOPY (EGD) WITH PROPOFOL N/A 12/04/2021   Procedure: ESOPHAGOGASTRODUODENOSCOPY (EGD) WITH PROPOFOL;  Surgeon: Lanelle Bal, DO;  [facility];  Service: Endoscopy;  Laterality: N/A;   ESOPHAGOGASTRODUODENOSCOPY (EGD) WITH PROPOFOL N/A 03/31/2022   Procedure: ESOPHAGOGASTRODUODENOSCOPY (EGD) WITH PROPOFOL;  Surgeon: Dolores Frame, MD;  [facility];  Service: Gastroenterology;  Laterality: N/A;  1015   EXPLORATORY LAPAROTOMY INCISIONAL VENTRAL HERNIA REPAIR / RESECTION SMALL BOWEL  11-09-2014   @[facility]    FRACTURE SURGERY N/A    Phreesia 10/29/2020   JOINT REPLACEMENT N/A     Phreesia 10/29/2020   LIPOMA EXCISION Right 09/16/2018   Procedure: EXCISION LIPOMA UPPER BACK;  Surgeon: Berna Bue, MD;  Location: [facility];  Service: General;  Laterality: Right;   MASTECTOMY Left 2006   w/ reconstruction on left (paget's disease)  and right breast augmentation   PERIPHERAL VASCULAR BALLOON ANGIOPLASTY Right 09/13/2023   Procedure: PERIPHERAL VASCULAR BALLOON ANGIOPLASTY;  Surgeon: Maeola Harman, MD;  Location: [facility]VASIVE CV LAB;  Service: Cardiovascular;  Laterality: Right;  Subclavian and axillary   POLYPECTOMY  05/02/2021   Procedure: POLYPECTOMY;  Surgeon: Dolores Frame, MD;  [facility];  Service: Gastroenterology;;  gastric   POLYPECTOMY  03/31/2022   Procedure: POLYPECTOMY;  Surgeon: Dolores Frame, MD;  [facility];  Service: Gastroenterology;;   Gaspar Bidding DILATION  05/02/2021   Procedure: Gaspar Bidding DILATION;  Surgeon: Dolores Frame, MD;  [facility];  Service: Gastroenterology;;   SMALL INTESTINE SURGERY N/A    Phreesia 10/29/2020   SPINE SURGERY N/A    Phreesia 10/29/2020   TOTAL COLECTOMY  08-04-2002    @[facility]    AND CHOLECYSTECTOMY  (colonic inertia)   TRANSTHORACIC ECHOCARDIOGRAM  02/04/2016   ef 60-65%,  grade 2 diastolic dysfunction/  mild MR   TUBAL LIGATION N/A    Phreesia 03/16/2021   UPPER EXTREMITY VENOGRAPHY N/A 09/13/2023   Procedure: UPPER EXTREMITY VENOGRAPHY;  Surgeon: Maeola Harman, MD;  Location: [facility] INVASIVE CV LAB;  Service: Cardiovascular;  Laterality: N/A;   VENA CAVA FILTER PLACEMENT  05/08/2005   @[facility]    [facility] (non-retrievable)   WIDE EXCISION PERIRECTAL ABSCESSES  09-22-2005   @ [facility]    Short Social History:  Social History   Tobacco Use   Smoking status: Never   Smokeless tobacco: Never  Substance Use Topics   Alcohol use: No    Allergies  Allergen Reactions   Nitrofurantoin Hives   Crestor [Rosuvastatin  Calcium] Other (See Comments)    Generalized cramps   Bisacodyl Other (See Comments)    Makes patient feel like she is having cramps    Codeine Itching   Iron Sucrose Other (See Comments)    Flushing- required benadryl and solu medrol   Monosodium Glutamate Other (See Comments)    Cluster migraines    Scopolamine Hbr Other (See Comments)    Cluster migraines, impaired vision   Morphine Rash   Other Hives, Itching, Swelling, Rash and Other (See Comments)    Cigarette smoke Causes Migraines     Current Outpatient Medications  Medication Sig Dispense Refill   acyclovir (ZOVIRAX) 800 MG tablet Take 1 tablet (800 mg total) by mouth 5 (five) times daily. 50 tablet 1   ALPRAZolam (XANAX) 1 MG tablet Take 1 tablet (1 mg total) by mouth 2 (two) times daily as needed for anxiety. (Patient taking differently: Take 1 mg by mouth 2 (two) times daily.) 60 tablet 5   Ascorbic Acid (VITAMIN C) 250 MG CHEW Chew 500 mg by mouth daily.     aspirin 81 MG chewable tablet Chew 81 mg by mouth daily.     butalbital-acetaminophen-caffeine (FIORICET) 50-325-40 MG tablet Take 1 tablet by mouth every 6 (six) hours as needed for headache.     conjugated estrogens (PREMARIN)  vaginal cream INSERT pea sized amount three times weekly intravaginally 30 g 2   cyanocobalamin (VITAMIN B12) 1000 MCG/ML injection ADMINISTER 1 ML(1000 MCG) IN THE MUSCLE EVERY 30 DAYS 1 mL 11   cyclobenzaprine (FLEXERIL) 10 MG tablet Take 1 tablet (10 mg total) by mouth 2 (two) times daily as needed for muscle spasms. (Patient taking differently: Take 10 mg by mouth 2 (two) times daily.) 90 tablet 3   cycloSPORINE (RESTASIS) 0.05 % ophthalmic emulsion Place 1 drop into both eyes 2 (two) times daily as needed (dry eyes).     DULoxetine (CYMBALTA) 60 MG capsule Take 1 capsule (60 mg total) by mouth daily. 30 capsule 3   ezetimibe (ZETIA) 10 MG tablet TAKE 1 TABLET(10 MG) BY MOUTH DAILY (Patient taking differently: Take 10 mg by mouth at  bedtime.) 90 tablet 3   fenofibrate (TRICOR) 48 MG tablet TAKE 1 TABLET(48 MG) BY MOUTH DAILY (Patient taking differently: Take 48 mg by mouth at bedtime.) 30 tablet 5   furosemide (LASIX) 20 MG tablet TAKE 1 TABLET(20 MG) BY MOUTH DAILY 30 tablet 3   HYDROmorphone (DILAUDID) 4 MG tablet Take 4 mg by mouth 3 (three) times daily.     INTRAROSA 6.5 MG INST Place 1 suppository vaginally at bedtime.     Lactobacillus (CULTURELLE DIGESTIVE WOMENS PO) Take 1 capsule by mouth daily.     levothyroxine (SYNTHROID) 125 MCG tablet TAKE 1 TABLET(125 MCG) BY MOUTH DAILY BEFORE BREAKFAST 90 tablet 1   linaclotide (LINZESS) 290 MCG CAPS capsule Take 1 capsule (290 mcg total) by mouth daily before breakfast. 30 capsule 11   magnesium oxide (MAG-OX) 400 (240 Mg) MG tablet TAKE 1 TABLET BY MOUTH FOUR TIMES DAILY 180 tablet 0   MegaRed Omega-3 Krill Oil 500 MG CAPS Take 500 mg by mouth at bedtime.     Melatonin 10 MG TABS Take 10 mg by mouth at bedtime.     metFORMIN (GLUCOPHAGE-XR) 500 MG 24 hr tablet Take 500 mg by mouth 2 (two) times daily with a meal.     metoprolol succinate (TOPROL-XL) 25 MG 24 hr tablet TAKE 1 TABLET(25 MG) BY MOUTH DAILY (Patient taking differently: Take 25 mg by mouth 2 (two) times daily as needed (heart racing).) 90 tablet 2   Multiple Vitamins-Minerals (ICAPS AREDS 2 PO) Take 1 capsule by mouth at bedtime.     naloxone (NARCAN) nasal spray 4 mg/0.1 mL Place 1 spray into the nose as needed (opioid reversal).     omeprazole (PRILOSEC) 40 MG capsule TAKE 1 CAPSULE(40 MG) BY MOUTH IN THE MORNING AND AT BEDTIME 180 capsule 3   OVER THE COUNTER MEDICATION Take 2 tablets by mouth 2 (two) times daily. Nutra-ful     OVER THE COUNTER MEDICATION Take 1 capsule by mouth at bedtime. Blink Eye Supplement for Dry eyes     phenazopyridine (PYRIDIUM) 95 MG tablet Take by mouth 3 (three) times daily as needed. sd     Polyethyl Glycol-Propyl Glycol (SYSTANE ULTRA) 0.4-0.3 % SOLN Place 1 drop into both eyes  as needed (dry eyes).     polyethylene glycol (MIRALAX / GLYCOLAX) 17 g packet Take 17 g by mouth daily as needed for mild constipation or moderate constipation.     potassium chloride (KLOR-CON M) 10 MEQ tablet TAKE 1 TABLET(10 MEQ) BY MOUTH TWICE DAILY 180 tablet 3   pravastatin (PRAVACHOL) 20 MG tablet TAKE 1 TABLET(20 MG) BY MOUTH DAILY (Patient taking differently: Take 20 mg by mouth at bedtime.)  90 tablet 1   promethazine (PHENERGAN) 12.5 MG tablet Take 12.5 mg by mouth every 6 (six) hours as needed for nausea or vomiting.     sennosides-docusate sodium (SENOKOT-S) 8.6-50 MG tablet Take 2 tablets by mouth at bedtime.     Vitamin D, Ergocalciferol, (DRISDOL) 1.25 MG (50000 UNIT) CAPS capsule Take 50,000 Units by mouth every 30 (thirty) days.     XARELTO 20 MG TABS tablet TAKE 1 TABLET(20 MG) BY MOUTH DAILY 30 tablet 3   Current Facility-Administered Medications  Medication Dose Route Frequency Provider Last Rate Last Admin   methylPREDNISolone acetate (DEPO-MEDROL) injection 40 mg  40 mg Intramuscular Once         Review of Systems  Constitutional:  Constitutional negative. HENT: HENT negative.  Eyes: Eyes negative.  Respiratory: Respiratory negative.  Cardiovascular: Positive for chest pain.  GI: Gastrointestinal negative.  Musculoskeletal:       Neck pain Skin: Skin negative.  Neurological: Positive for focal weakness and numbness.  Hematologic: Hematologic/lymphatic negative.  Psychiatric: Psychiatric negative.        Objective:  Objective   Vitals:   03/22/24 1038  BP: 116/81  Pulse: 96  Temp: 97.9 F (36.6 C)  SpO2: 98%  Weight: 190 lb (86.2 kg)  Height: 5\' 4"  (1.626 m)   Body mass index is 32.61 kg/m.  Physical Exam HENT:     Head: Normocephalic.     Nose: Nose normal.     Mouth/Throat:     Mouth: Mucous membranes are moist.  Eyes:     Pupils: Pupils are equal, round, and reactive to light.  Neck:     Comments: Right supraclavicular tenderness to  palpation Cardiovascular:     Rate and Rhythm: Normal rate.     Pulses:          Radial pulses are 2+ on the right side and 2+ on the left side.  Abdominal:     General: Abdomen is flat.  Musculoskeletal:        General: Normal range of motion.     Cervical back: Normal range of motion.     Right lower leg: No edema.     Left lower leg: No edema.  Skin:    General: Skin is warm.     Capillary Refill: Capillary refill takes less than 2 seconds.  Neurological:     General: No focal deficit present.     Mental Status: She is alert.  Psychiatric:        Mood and Affect: Mood normal.        Thought Content: Thought content normal.        Judgment: Judgment normal.     Data: No new studies     Assessment/Plan:    63 year old female with evidence of multifactorial right upper extremity pain likely secondary to previous trauma including cervical spine disease, possible complex regional pain syndrome and 1 component is neurogenic and possible venous thoracic outlet syndrome.  She has undergone balloon venoplasty of her central veins with much improvement in swelling and now has undergone scalene block with significant improvement in her symptoms particularly in the right neck and shoulder.  We have discussed that she is reasonable to proceed with right first rib resection given that she had a dramatically positive response to scalene block.  We did discuss that first rib resection would only treat 1 component of her pain and that she is unlikely to ever be pain-free or have a fully functional right upper  extremity.  She does have a history of transaxillary breast implant surgery but this does not appear to be in the way of our surgical field.  We have discussed proceeding with transaxillary versus supraclavicular or infraclavicular approach given that she has both the venous and neurogenic component.  She demonstrates good understanding and we discussed the risk and benefits as well including  risk of nerve injury, risk of no improvement in pain and risk of pneumothorax requiring chest tube.  She understands that she will stay at least 1 night in the hospital and will require postprocedural physical therapy.  All questions were answered.     Maeola Harman MD [facility]

## 2024-03-23 ENCOUNTER — Ambulatory Visit: Payer: Medicare Other | Admitting: Urology

## 2024-03-27 ENCOUNTER — Ambulatory Visit: Payer: Medicare Other | Admitting: Internal Medicine

## 2024-03-28 ENCOUNTER — Inpatient Hospital Stay: Admitting: Physician Assistant

## 2024-03-31 ENCOUNTER — Ambulatory Visit: Payer: Medicare Other | Admitting: Neurology

## 2024-04-03 ENCOUNTER — Other Ambulatory Visit: Payer: Self-pay

## 2024-04-03 ENCOUNTER — Inpatient Hospital Stay

## 2024-04-03 DIAGNOSIS — G540 Brachial plexus disorders: Secondary | ICD-10-CM
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
## 2024-04-04 ENCOUNTER — Encounter: Payer: Self-pay | Admitting: Neurology

## 2024-04-04 ENCOUNTER — Telehealth: Payer: Self-pay

## 2024-04-04 ENCOUNTER — Ambulatory Visit: Payer: Medicare Other | Admitting: Neurology

## 2024-04-04 VITALS — BP 106/66 | HR 83 | Ht 64.0 in | Wt 198.0 lb

## 2024-04-04 DIAGNOSIS — G43E09 Chronic migraine with aura, not intractable, without status migrainosus: Secondary | ICD-10-CM

## 2024-04-04 DIAGNOSIS — M5030 Other cervical disc degeneration, unspecified cervical region: Secondary | ICD-10-CM

## 2024-04-04 NOTE — Telephone Encounter (Signed)
 Patient seen in office today, Per Dr.Jaffe restart Botox 200 units.  PA team please start a PA for botox 200 units.

## 2024-04-04 NOTE — Progress Notes (Signed)
 Medication Samples have been provided to the patient.  Drug name: Nurtec       Strength: 75 mg        Qty: 2   LOT: [id number]  Exp.Date: 6/28  Dosing instructions: as needed  The patient has been instructed regarding the correct time, dose, and frequency of taking this medication, including desired effects and most common side effects.   Leida Lauth 3:56 PM 04/04/2024

## 2024-04-04 NOTE — Patient Instructions (Signed)
 Plan to start Botox Taper off of Fioricet by one day every week At earliest onset of migraine, take Nurtec - 1 tablet daily as needed.  Let me know if it works Rule of thumb for use of pain relievers and rebound headache:  take less than 10 days out of the month Keep headache diary Follow up 7 months.

## 2024-04-05 ENCOUNTER — Telehealth: Payer: Self-pay | Admitting: Pharmacy Technician

## 2024-04-05 ENCOUNTER — Encounter: Payer: Self-pay | Admitting: Hematology

## 2024-04-05 ENCOUNTER — Other Ambulatory Visit (HOSPITAL_COMMUNITY): Payer: Self-pay

## 2024-04-05 NOTE — Telephone Encounter (Signed)
 Pharmacy Benefit PA has been submitted for Botox via CoverMyMeds.  INSURANCE: BCBS MEDICARE KEY/EOC/FAX: [id number] Status is Pending  Medical Benefit PA has been submitted for Botox via Fax.  INSURANCE: [employer] MEDICARE KEY/EOC/FAX: [phone] Status is Pending

## 2024-04-05 NOTE — Telephone Encounter (Signed)
 PA has been submitted, and telephone encounter has been created. Please see telephone encounter dated 4.9.25.

## 2024-04-05 NOTE — Telephone Encounter (Signed)
 Pharmacy Patient Advocate Encounter   [employer]otox[employer] verification has been Submitted Benefit Verification #:  [id number]   Dx Code: G43.E09 J-code: [id number] Procedure code: 16109

## 2024-04-06 ENCOUNTER — Other Ambulatory Visit (HOSPITAL_COMMUNITY): Payer: Self-pay

## 2024-04-06 ENCOUNTER — Telehealth: Payer: Self-pay | Admitting: Neurology

## 2024-04-06 MED ORDER — ONABOTULINUMTOXINA 200 UNITS IJ SOLR: INTRAMUSCULAR | 4 refills | Status: AC

## 2024-04-06 NOTE — Telephone Encounter (Signed)
 LMOVM for patient. Mychart sent as well.

## 2024-04-06 NOTE — Telephone Encounter (Signed)
 Pharmacy Patient Advocate Encounter- Botox BIV-Pharmacy Benefit:  PA was submitted to [facility] and has been approved through: 4.9.25 to 4.9.26 Authorization# [id number]  Please send prescription to Specialty Pharmacy: [facility]: [phone] Estimated Copay is: ?  Admin Code: 10272 DOES require Prior Auth. APPROVED 4.9.25 TO 7.8.25  AUTH# [id number]  Patient IS NOT eligible for Botox Copay Card, which will make patient's copay as little as zero. Copay card will be provided to pharmacy.

## 2024-04-06 NOTE — Telephone Encounter (Signed)
 Left message with the after service on 04-06-24   Caller states that they are calling since they are reviewing a prior auth for Botox how does it read  how often should patient get injection

## 2024-04-06 NOTE — Telephone Encounter (Signed)
 PA still pending.

## 2024-04-07 ENCOUNTER — Other Ambulatory Visit (HOSPITAL_COMMUNITY): Payer: Self-pay

## 2024-04-07 ENCOUNTER — Ambulatory Visit: Payer: Self-pay | Admitting: Family Medicine

## 2024-04-07 NOTE — Telephone Encounter (Signed)
 Copied from CRM [id number]. Topic: Clinical - Red Word Triage >> Apr 07, 2024 12:13 PM [name] wrote: Red Word that prompted transfer to Nurse Triage: gastroparesis episode, feels weak, bloated   Chief Complaint: Abdominal Bloating Symptoms: pain and intermittent shortness of breath Frequency: constant Pertinent Negatives: Patient denies chest pain Disposition: [x] ED /[] Urgent Care (no appt availability in office) / [] Appointment(In office/virtual)/ []  [facility]/ [] Home Care/ [] Refused Recommended Disposition /[] [facility]/ []  Follow-up with PCP Additional Notes: Patient reports gastroparesis episode last week. States that pain is overall getting better but she had significant bloating and shortness of breath when standing. No avail appts today, RN advising ED. Pt is agreeable.   Reason for Disposition  [1] MODERATE-SEVERE SWELLING of abdomen (e.g., looks very distended or swollen) AND [2] NEW-onset or much worse  Answer Assessment - Initial Assessment Questions 1. SYMPTOM: "What's the main symptom you're concerned about?" (e.g., abdomen bloating, swelling)     Bloating, trouble breathing when standing  2. ONSET: "When did Bloating  start?"     All weak  3. SEVERITY: "How bad is the bloating or swelling?"    - BLOATING: Feels gassy or bloated. No visible swelling.     - MILD SWELLING: Feels gassy or bloated. Abdomen looks mildly distended or swollen.    - MODERATE - SEVERE SWELLING: Abdomen looks very distended or swollen.      Mild to moderrate  4. ABDOMEN PAIN:  "Is there any abdomen pain?" If Yes, ask: "How bad is the pain?"  (e.g., Scale 1-10; mild, moderate, or severe)   - NONE (0): No pain.   - MILD (1-3): Doesn't interfere with normal activities, abdomen soft and not tender to touch.    - MODERATE (4-7): Interferes with normal activities or awakens from sleep, abdomen tender to touch.    - SEVERE (8-10): Excruciating pain, doubled over, unable to do  any normal activities.       No pain  5. RELIEVING AND AGGRAVATING FACTORS: "What makes it better or worse?" (e.g., certain foods, lactose, medicines)     Standing up makes symptoms worse  6. GI HISTORY: "Do you have any history of stomach or intestine problems?" (e.g., bowel obstruction, cancer, irritable bowel)      GI history of gastroparesis  7. CAUSE: "What do you think is causing the bloating?"      Had gastroparesis episode, electrolytes are off  8. OTHER SYMPTOMS: "Do you have any other symptoms?" (e.g., belching, blood in stool, breathing difficulty, constipation, diarrhea, fever, passing gas, vomiting, weight loss, white of eyes have turned yellow)     Passing gas, loose bowels  9. PREGNANCY: "Is there any chance you are pregnant?" "When was your last menstrual period?"     no  Protocols used: Abdomen Bloating and Swelling-A-AH

## 2024-04-11 ENCOUNTER — Ambulatory Visit: Payer: Self-pay

## 2024-04-11 ENCOUNTER — Inpatient Hospital Stay: Admitting: Physician Assistant

## 2024-04-11 NOTE — Telephone Encounter (Signed)
 Copied from CRM [id number]. Topic: Clinical - Red Word Triage >> Apr 07, 2024 12:13 PM Star East wrote: Red Word that prompted transfer to Nurse Triage: gastroparesis episode, feels weak, bloated >> Apr 11, 2024  2:26 PM Lizabeth Riggs wrote: She is still having diarrhea, very very nauseated, she is staying in bed all of the time, she gets out of breath when she gets up  Chief Complaint: abd pain, nausea/ vomiting; seen in ER on 4/12 with gastroparesis Symptoms: see above Frequency: for two weeks Pertinent Negatives: Patient denies fever, vomiting, diarrhea Disposition: [] ED /[] Urgent Care (no appt availability in office) / [x] Appointment(In office/virtual)/ []  [facility]/ [] Home Care/ [] Refused Recommended Disposition /[] [facility]/ []  Follow-up with PCP Additional Notes: per protocol apt made; care advice given, denies questions; instructed to go to ER if becomes worse.   Reason for Disposition  Nausea lasts > 1 week  Answer Assessment - Initial Assessment Questions 1. NAUSEA SEVERITY: "How bad is the nausea?" (e.g., mild, moderate, severe; dehydration, weight loss)   - MILD: loss of appetite without change in eating habits   - MODERATE: decreased oral intake without significant weight loss, dehydration, or malnutrition   - SEVERE: inadequate caloric or fluid intake, significant weight loss, symptoms of dehydration     "Comes and goes" 2. ONSET: "When did the nausea begin?"     Two weeks ago 3. VOMITING: "Any vomiting?" If Yes, ask: "How many times today?"     "I was". One time yesterday 4. RECURRENT SYMPTOM: "Have you had nausea before?" If Yes, ask: "When was the last time?" "What happened that time?"     yes 5. CAUSE: "What do you think is causing the nausea?"     gastroparesis 6. PREGNANCY: "Is there any chance you are pregnant?" (e.g., unprotected intercourse, missed birth control pill, broken condom)     na  Protocols used: Nausea-A-AH

## 2024-04-11 NOTE — Telephone Encounter (Signed)
 noted

## 2024-04-12 ENCOUNTER — Ambulatory Visit: Payer: Medicare Other | Admitting: Neurology

## 2024-04-12 ENCOUNTER — Ambulatory Visit: Admitting: Student in an Organized Health Care Education/Training Program

## 2024-04-12 NOTE — Telephone Encounter (Signed)
 Pharmacy Patient Advocate Encounter  [employer] Portal verification has been completed.  Benefit Verification #:  [id number]   Dx Code: G43.E09  J-code: [employer]- W2956 Pending [employer] report require PA Procedure code: 21308 Does Not require PA  Primary Insurance: [employer] Patient estimated coinsurance: 20% Patient copay: $0 Patient's remaining deductible: $5,251.02   *Benefit info changes as patient continues to use their insurance benefits*

## 2024-04-13 ENCOUNTER — Emergency Department (HOSPITAL_COMMUNITY)
Admission: EM | Admit: 2024-04-13 | Discharge: 2024-04-14 | Disposition: A | Discharge status: Home / Self Care | Attending: Emergency Medicine | Admitting: Emergency Medicine

## 2024-04-13 ENCOUNTER — Ambulatory Visit: Admitting: Internal Medicine

## 2024-04-13 ENCOUNTER — Encounter: Payer: Self-pay | Admitting: Internal Medicine

## 2024-04-13 ENCOUNTER — Emergency Department (HOSPITAL_COMMUNITY)

## 2024-04-13 ENCOUNTER — Other Ambulatory Visit: Payer: Self-pay

## 2024-04-13 ENCOUNTER — Ambulatory Visit: Payer: Medicare Other | Admitting: Urology

## 2024-04-13 ENCOUNTER — Encounter (HOSPITAL_COMMUNITY): Payer: Self-pay | Admitting: *Deleted

## 2024-04-13 VITALS — BP 96/69 | HR 102 | Ht 64.0 in | Wt 184.6 lb

## 2024-04-13 DIAGNOSIS — D72829 Elevated white blood cell count, unspecified: Secondary | ICD-10-CM | POA: Insufficient documentation

## 2024-04-13 DIAGNOSIS — K5989 Other specified functional intestinal disorders: Secondary | ICD-10-CM

## 2024-04-13 DIAGNOSIS — N179 Acute kidney failure, unspecified: Secondary | ICD-10-CM | POA: Diagnosis not present

## 2024-04-13 DIAGNOSIS — E860 Dehydration: Secondary | ICD-10-CM | POA: Diagnosis not present

## 2024-04-13 DIAGNOSIS — K3184 Gastroparesis: Secondary | ICD-10-CM | POA: Diagnosis not present

## 2024-04-13 DIAGNOSIS — Z853 Personal history of malignant neoplasm of breast: Secondary | ICD-10-CM | POA: Diagnosis not present

## 2024-04-13 DIAGNOSIS — Z7982 Long term (current) use of aspirin: Secondary | ICD-10-CM | POA: Diagnosis not present

## 2024-04-13 DIAGNOSIS — R112 Nausea with vomiting, unspecified: Secondary | ICD-10-CM | POA: Diagnosis present

## 2024-04-13 DIAGNOSIS — E876 Hypokalemia: Secondary | ICD-10-CM | POA: Insufficient documentation

## 2024-04-13 DIAGNOSIS — I959 Hypotension, unspecified: Secondary | ICD-10-CM | POA: Diagnosis not present

## 2024-04-13 DIAGNOSIS — E871 Hypo-osmolality and hyponatremia: Secondary | ICD-10-CM | POA: Diagnosis not present

## 2024-04-13 DIAGNOSIS — K90821 Short bowel syndrome with colon in continuity: Secondary | ICD-10-CM | POA: Diagnosis not present

## 2024-04-13 DIAGNOSIS — E039 Hypothyroidism, unspecified: Secondary | ICD-10-CM | POA: Diagnosis not present

## 2024-04-13 DIAGNOSIS — Z8673 Personal history of transient ischemic attack (TIA), and cerebral infarction without residual deficits: Secondary | ICD-10-CM | POA: Insufficient documentation

## 2024-04-13 DIAGNOSIS — Z7901 Long term (current) use of anticoagulants: Secondary | ICD-10-CM | POA: Diagnosis not present

## 2024-04-13 LAB — COMPREHENSIVE METABOLIC PANEL WITH GFR
ALT: 21 U/L (ref 0–44)
AST: 24 U/L (ref 15–41)
Albumin: 4.5 g/dL (ref 3.5–5.0)
Alkaline Phosphatase: 80 U/L (ref 38–126)
Anion gap: 13 (ref 5–15)
BUN: 35 mg/dL — ABNORMAL HIGH (ref 8–23)
CO2: 14 mmol/L — ABNORMAL LOW (ref 22–32)
Calcium: 10.4 mg/dL — ABNORMAL HIGH (ref 8.9–10.3)
Chloride: 104 mmol/L (ref 98–111)
Creatinine, Ser: 1.2 mg/dL — ABNORMAL HIGH (ref 0.44–1.00)
GFR, Estimated: 51 mL/min — ABNORMAL LOW (ref >60)
Glucose, Bld: 120 mg/dL — ABNORMAL HIGH (ref 70–99)
Potassium: 3.4 mmol/L — ABNORMAL LOW (ref 3.5–5.1)
Sodium: 131 mmol/L — ABNORMAL LOW (ref 135–145)
Total Bilirubin: 1.1 mg/dL (ref 0.0–1.2)
Total Protein: 7.5 g/dL (ref 6.5–8.1)

## 2024-04-13 LAB — URINALYSIS, ROUTINE W REFLEX MICROSCOPIC
Bacteria, UA: NONE SEEN
Bilirubin Urine: NEGATIVE
Glucose, UA: NEGATIVE mg/dL
Hgb urine dipstick: NEGATIVE
Ketones, ur: NEGATIVE mg/dL
Leukocytes,Ua: NEGATIVE
Nitrite: NEGATIVE
Protein, ur: 30 mg/dL — AB
Specific Gravity, Urine: >1.046 — ABNORMAL HIGH (ref 1.005–1.030)
pH: 6 (ref 5.0–8.0)

## 2024-04-13 LAB — CBC
HCT: 46.1 % — ABNORMAL HIGH (ref 36.0–46.0)
Hemoglobin: 16.5 g/dL — ABNORMAL HIGH (ref 12.0–15.0)
MCH: 31.1 pg (ref 26.0–34.0)
MCHC: 35.8 g/dL (ref 30.0–36.0)
MCV: 87 fL (ref 80.0–100.0)
Platelets: 321 10*3/uL (ref 150–400)
RBC: 5.3 MIL/uL — ABNORMAL HIGH (ref 3.87–5.11)
RDW: 13.7 % (ref 11.5–15.5)
WBC: 11.9 10*3/uL — ABNORMAL HIGH (ref 4.0–10.5)
nRBC: 0 % (ref 0.0–0.2)

## 2024-04-13 LAB — LIPASE, BLOOD: Lipase: 49 U/L (ref 11–51)

## 2024-04-13 MED ORDER — METOCLOPRAMIDE HCL 5 MG/ML IJ SOLN
10.0000 mg | Freq: Once | INTRAMUSCULAR | Status: AC
  Administered 2024-04-13: 10 mg via INTRAVENOUS
  Filled 2024-04-13: qty 2

## 2024-04-13 MED ORDER — METOCLOPRAMIDE HCL 10 MG PO TABS: 10.0000 mg | ORAL_TABLET | Freq: Four times a day (QID) | ORAL | 0 refills | Status: AC

## 2024-04-13 MED ORDER — POTASSIUM CHLORIDE 20 MEQ PO PACK
40.0000 meq | PACK | Freq: Two times a day (BID) | ORAL | Status: DC
  Administered 2024-04-13: 40 meq via ORAL
  Filled 2024-04-13: qty 2

## 2024-04-13 MED ORDER — HYDROMORPHONE HCL 1 MG/ML IJ SOLN
1.0000 mg | Freq: Once | INTRAMUSCULAR | Status: AC
  Administered 2024-04-13: 1 mg via INTRAVENOUS
  Filled 2024-04-13: qty 1

## 2024-04-13 MED ORDER — LACTATED RINGERS IV BOLUS
1000.0000 mL | Freq: Once | INTRAVENOUS | Status: AC
  Administered 2024-04-13: 1000 mL via INTRAVENOUS

## 2024-04-13 MED ORDER — MAGNESIUM SULFATE 2 GM/50ML IV SOLN
2.0000 g | Freq: Once | INTRAVENOUS | Status: AC
  Administered 2024-04-13: 2 g via INTRAVENOUS
  Filled 2024-04-13: qty 50

## 2024-04-13 MED ORDER — IOHEXOL 300 MG/ML  SOLN
100.0000 mL | Freq: Once | INTRAMUSCULAR | Status: AC | PRN
  Administered 2024-04-13: 100 mL via INTRAVENOUS

## 2024-04-13 NOTE — Assessment & Plan Note (Signed)
 History of multiple SBO requiring bowel resection

## 2024-04-13 NOTE — Discharge Instructions (Addendum)
 Thank you for coming to [facility] Emergency Department. You were seen for nausea/vomiting, abdominal pain, gastroparesis. We did an exam, labs, and imaging, and these showed dehydration and electrolyte abnormalities. You were given fluids/electrolytes and improved. We have prescribed you reglan 10 mg every 6-8 hours for nausea/vomiting associated with gastroparesis. Please follow up with your gastroenterologist or with your primary care provider within 1 week.   Do not hesitate to return to the ED or call 911 if you experience: -Worsening symptoms -Nausea/vomiting so severe you cannot eat/drink anything -Severe dehydration -Lightheadedness, passing out -Fevers/chills -Anything else that concerns you

## 2024-04-13 NOTE — ED Provider Notes (Incomplete)
 [place] EMERGENCY DEPARTMENT AT [facility] Provider Note   CSN: [id number] Arrival date & time: 04/13/24  1432     History {Add pertinent medical, surgical, social history, OB history to HPI:1} No chief complaint on file.   Vada Swift is a 63 y.o. female with PMH as listed below who presents with h/o gastroparesis, p/w three weeks of increased nausea/vomiting, worsening today. Having biliary emesis today. Can't keep anything down. Has h/o extensive abdominal surgeries. Feels extremely lightheaded when she stands, feels palpitations with standing, feels like she is going to pass out. Hasn't fallen or hit her head. No CP. Endorses epigastric abdominal pain especially with heaving. Was sent by PCP for fluids/CT scan.    Past Medical History:  Diagnosis Date  . Abdominal hernia 08/14/2019  . Abdominal pain 06/06/2019  . Acute bronchitis 05/01/2013  . Acute cystitis 06/09/2010   Qualifier: Diagnosis of  By: Lillia Mountain LPN, Brandi    . Acute renal insufficiency 08/28/2011  . Allergy    Phreesia 03/16/2021  . Anemia    Phreesia 10/29/2020  . Anxiety   . Anxiety state 03/16/2008   Qualifier: Diagnosis of  By: Lind Guest    . Arthritis    Phreesia 10/29/2020  . Back pain with radiation 07/17/2014  . Bilateral hand pain 07/27/2016  . Blood transfusion without reported diagnosis    Phreesia 10/29/2020  . Breast cancer (HCC)    L breast- 2006  . Cancer (HCC)    Phreesia 10/29/2020  . Carpal tunnel syndrome   . Chronic constipation   . Chronic pain syndrome    followed by [facility]---  back  . Clotting disorder (HCC)    Phreesia 10/29/2020  . Depression   . Dermatitis 12/15/2011  . Difficult intubation   . Educated about COVID-19 virus infection 05/14/2019  . Fall at home 01/26/2016  . Family history of adverse reaction to anesthesia    MOTHER--- PONV  . Fatigue 09/17/2012  . Fibromyalgia   . FIBROMYALGIA 03/16/2008   Qualifier: Diagnosis of   By: Lind Guest    . GERD (gastroesophageal reflux disease)   . Headache disorder 01/16/2013  . Headache syndrome 01/26/2016  . Hip pain, right 07/08/2019  . History of MRSA infection    lip abscess  . History of ovarian cyst 06/2011   s/p  BSO  . History of pulmonary embolus (PE) 1997   post EP with ablation pulmonary veouns for SVT/ Atrial Fib.  . History of supraventricular tachycardia    s/p  ablation 1996  and 1997  by dr Graciela Husbands  . History of TIA (transient ischemic attack) 1997   post op EP ablation PE  . Hyperlipidemia   . Hyperlipidemia LDL goal <100 03/16/2008   Qualifier: Diagnosis of  By: Lind Guest    . Hypothyroidism    followed by pcp  . Incisional hernia   . Insomnia 12/15/2011  . Intermittent palpitations 08/22/2017  . Interstitial cystitis    09-13-2018   per pt last flare-up  May 2019 (followed by pcp)  . INTERSTITIAL CYSTITIS 02/17/2011   Qualifier: Diagnosis of  By: Lodema Hong MD, Claris Che    . Iron deficiency anemia    09-13-2018  PER PT STABLE  . Irregular heart rate 07/25/2013  . Lipoma of back    upper  . Low back pain radiating to right leg 07/04/2019  . Low ferritin level 06/14/2014  . MDD (major depressive disorder), single episode, in full remission (HCC) 10/27/2017  .  Medically noncompliant 02/28/2015   Multiple missed appointments, both follow-up appointments and lab appointments.   . Metabolic syndrome X 02/10/2011   Qualifier: Diagnosis of  By: Rodolph Clap MD, Margaret  hBA1c is 5.8 in 02/2013   . Migraines   . Morbid obesity (HCC) 03/27/2013  . Muscle spasm 01/03/2020  . Nausea alone 07/17/2014  . NECK PAIN, CHRONIC 03/16/2008   Qualifier: Diagnosis of  By: Almetta Jacquet    . Normal coronary arteries    a. by CT 12/2018.  . Obesity 03/16/2008   Qualifier: Diagnosis of  By: Almetta Jacquet    . Oral ulceration 08/23/2014   Presented at 06/14/2014 visit   . Other malaise and fatigue 03/16/2008   Centricity Description: FATIGUE, CHRONIC  Qualifier: Diagnosis of  By: Almetta Jacquet   Centricity Description: FATIGUE Qualifier: Diagnosis of  By: Brion Cancel, Dawn    . OVARIAN CYST 12/26/2009   Qualifier: Diagnosis of  By: Rodolph Clap MD, Daivd Dub    . Paget disease of breast, left (HCC)   . Paget's disease of breast, left (HCC) 03/16/2008   Qualifier: Diagnosis of  By: Almetta Jacquet  Left diagnosed in 2006 F/h breast cancer x 15 family members  . Partial small bowel obstruction (HCC) 07/04/2012  . PONV (postoperative nausea and vomiting)    SEVERE  . Postsurgical menopause 11/13/2011  . Presence of IVC filter 06/06/2019  . PSVT (paroxysmal supraventricular tachycardia) (HCC)    HX ABLATION 1996 AND 1997  . Recurrent oral herpes simplex infection 10/13/2017  . ROM (right otitis media) 01/23/2013  . S/P insertion of IVC (inferior vena caval) filter 05/08/2005   greenfield (non-retrievable)  /  dx 2019  a leg of filter is protruding thru the vena cava in to right L2 vertebral body (09-13-2018  per pt having surgery to remove filter in [place])  . S/P radiofrequency ablation operation for arrhythmia 1996   1996  and 1997,   SVT and Atrial Fib  . Sinusitis, chronic 01/26/2016  . SMALL BOWEL OBSTRUCTION, HX OF 08/07/2008   Annotation: obstruction w/ adhesions led to partial colectomy Qualifier: Diagnosis of  By: Kenney Peacemaker    . Stroke [facility])    Phreesia 10/29/2020, TIAs in the past  . Swelling of hand 09/17/2012  . Syncope   . Tachycardia 02/03/2010   Qualifier: Diagnosis of  By: Via LPN, Tanis Fan    . Thyroid disease    Phreesia 10/29/2020  . TOS (thoracic outlet syndrome)   . Ulcer 12/15/2011  . Urinary frequency 07/18/2014  . Vaginitis and vulvovaginitis 05/10/2013  . Vitamin D deficiency 05/05/2015  . Wears glasses        Home Medications Prior to Admission medications   Medication Sig Start Date End Date Taking? Authorizing Provider  ALPRAZolam (XANAX) 1 MG tablet Take 1 tablet (1 mg total) by mouth 2 (two) times  daily as needed for anxiety. Patient taking differently: Take 1 mg by mouth 2 (two) times daily. 01/29/24  Yes Towanda Fret, MD  aspirin 81 MG chewable tablet Chew 81 mg by mouth daily.   Yes [provider]  botulinum toxin Type A (BOTOX) 200 units injection Inject 155 units IM into multiple site in the face,neck and head once every 90 days 04/06/24  Yes Merriam Abbey, DO  butalbital-acetaminophen-caffeine (FIORICET) 50-325-40 MG tablet Take 1 tablet by mouth every 6 (six) hours as needed for headache. 04/11/22  Yes [provider]  conjugated estrogens (PREMARIN) vaginal cream INSERT pea sized amount three  times weekly intravaginally 03/14/24  Yes Kerri Perches, MD  cyanocobalamin (VITAMIN B12) 1000 MCG/ML injection ADMINISTER 1 ML(1000 MCG) IN THE MUSCLE EVERY 30 DAYS 12/14/23  Yes Pennington, Rebekah M, PA-C  cyclobenzaprine (FLEXERIL) 10 MG tablet Take 1 tablet (10 mg total) by mouth 2 (two) times daily as needed for muscle spasms. Patient taking differently: Take 10 mg by mouth 2 (two) times daily. 01/04/24  Yes Del Newman Nip, Tenna Child, FNP  cycloSPORINE (RESTASIS) 0.05 % ophthalmic emulsion Place 1 drop into both eyes 2 (two) times daily as needed (dry eyes).   Yes [provider]  DULoxetine (CYMBALTA) 60 MG capsule Take 1 capsule (60 mg total) by mouth daily. 01/04/24  Yes Del Newman Nip, Tenna Child, FNP  ezetimibe (ZETIA) 10 MG tablet TAKE 1 TABLET(10 MG) BY MOUTH DAILY Patient taking differently: Take 10 mg by mouth at bedtime. 10/11/23  Yes Kerri Perches, MD  fenofibrate (TRICOR) 48 MG tablet TAKE 1 TABLET(48 MG) BY MOUTH DAILY Patient taking differently: Take 48 mg by mouth at bedtime. 01/24/24  Yes Kerri Perches, MD  furosemide (LASIX) 20 MG tablet TAKE 1 TABLET(20 MG) BY MOUTH DAILY 03/13/24  Yes Kerri Perches, MD  HYDROmorphone (DILAUDID) 4 MG tablet Take 4 mg by mouth 3 (three) times daily.   Yes [provider]  INTRAROSA 6.5  MG INST Place 1 suppository vaginally at bedtime. 01/21/24  Yes [provider]  Lactobacillus (CULTURELLE DIGESTIVE WOMENS PO) Take 1 capsule by mouth daily.   Yes [provider]  levothyroxine (SYNTHROID) 125 MCG tablet TAKE 1 TABLET(125 MCG) BY MOUTH DAILY BEFORE BREAKFAST 12/06/23  Yes Kerri Perches, MD  linaclotide [facility]) 290 MCG CAPS capsule Take 1 capsule (290 mcg total) by mouth daily before breakfast. 01/24/24  Yes Marguerita Merles, Daniel, MD  magnesium oxide (MAG-OX) 400 (240 Mg) MG tablet TAKE 1 TABLET BY MOUTH FOUR TIMES DAILY 03/03/24  Yes Kerri Perches, MD  MegaRed Omega-3 Krill Oil 500 MG CAPS Take 500 mg by mouth at bedtime.   Yes [provider]  Melatonin 10 MG TABS Take 5 mg by mouth at bedtime.   Yes [provider]  metFORMIN (GLUCOPHAGE-XR) 500 MG 24 hr tablet Take 500 mg by mouth 2 (two) times daily with a meal.   Yes [provider]  metoCLOPramide (REGLAN) 10 MG tablet Take 1 tablet (10 mg total) by mouth every 6 (six) hours. 04/13/24  Yes Loetta Rough, MD  Multiple Vitamins-Minerals (ICAPS AREDS 2 PO) Take 1 capsule by mouth at bedtime.   Yes [provider]  naloxone (NARCAN) nasal spray 4 mg/0.1 mL Place 1 spray into the nose as needed (opioid reversal). 01/15/23  Yes [provider]  omeprazole (PRILOSEC) 40 MG capsule TAKE 1 CAPSULE(40 MG) BY MOUTH IN THE MORNING AND AT BEDTIME 02/04/24  Yes Kerri Perches, MD  ondansetron (ZOFRAN-ODT) 4 MG disintegrating tablet Take 4 mg by mouth every 8 (eight) hours as needed for nausea or vomiting. 04/10/24  Yes [provider]  OVER THE COUNTER MEDICATION Take 2 tablets by mouth 2 (two) times daily. Nutra-ful   Yes [provider]  Polyethyl Glycol-Propyl Glycol (SYSTANE ULTRA) 0.4-0.3 % SOLN Place 1 drop into both eyes as needed (dry eyes).   Yes [provider]  potassium chloride (KLOR-CON M) 10 MEQ tablet TAKE 1 TABLET(10  MEQ) BY MOUTH TWICE DAILY Patient taking differently: Take 10 mEq by mouth 3 (three) times daily. 02/04/24  Yes  Kerri Perches, MD  pravastatin (PRAVACHOL) 20 MG tablet TAKE 1 TABLET(20 MG) BY MOUTH DAILY Patient taking differently: Take 20 mg by mouth at bedtime. 11/02/23  Yes Kerri Perches, MD  PROMETHEGAN 25 MG suppository Place 25 mg rectally every 6 (six) hours as needed for nausea or vomiting. 04/10/24  Yes [provider]  Vitamin D, Ergocalciferol, (DRISDOL) 1.25 MG (50000 UNIT) CAPS capsule Take 50,000 Units by mouth every 30 (thirty) days.   Yes [provider]  XARELTO 20 MG TABS tablet TAKE 1 TABLET(20 MG) BY MOUTH DAILY 03/20/24  Yes Kerri Perches, MD  ALPRAZolam Prudy Feeler) 1 MG tablet Take 1 tablet (1 mg total) by mouth 2 (two) times daily. 07/18/23   Kerri Perches, MD      Allergies    Nitrofurantoin, Crestor [rosuvastatin calcium], Bisacodyl, Codeine, Iron sucrose, Monosodium glutamate, Scopolamine hbr, Morphine, and Other    Review of Systems   Review of Systems A 10 point review of systems was performed and is negative unless otherwise reported in HPI.  Physical Exam Updated Vital Signs BP 110/71   Pulse 79   Temp 98.2 F (36.8 C) (Oral)   Resp 11   Ht 5\' 4"  (1.626 m)   Wt 83 kg   SpO2 99%   BMI 31.41 kg/m  Physical Exam General: Normal appearing {Desc; female/female:11659}, lying in bed.  HEENT: PERRLA, Sclera anicteric, MMM, trachea midline.  Cardiology: RRR, no murmurs/rubs/gallops. BL radial and DP pulses equal bilaterally.  Resp: Normal respiratory rate and effort. CTAB, no wheezes, rhonchi, crackles.  Abd: Soft, non-tender, non-distended. No rebound tenderness or guarding.  GU: Deferred. MSK: No peripheral edema or signs of trauma. Extremities without deformity or TTP. No cyanosis or clubbing. Skin: warm, dry. No rashes or lesions. Back: No CVA tenderness Neuro: A&Ox4, CNs II-XII grossly intact. MAEs. Sensation grossly  intact.  Psych: Normal mood and affect.   ED Results / Procedures / Treatments   Labs (all labs ordered are listed, but only abnormal results are displayed) Labs Reviewed  COMPREHENSIVE METABOLIC PANEL WITH GFR - Abnormal; Notable for the following components:      Result Value   Sodium 131 (*)    Potassium 3.4 (*)    CO2 14 (*)    Glucose, Bld 120 (*)    BUN 35 (*)    Creatinine, Ser 1.20 (*)    Calcium 10.4 (*)    GFR, Estimated 51 (*)    All other components within normal limits  CBC - Abnormal; Notable for the following components:   WBC 11.9 (*)    RBC 5.30 (*)    Hemoglobin 16.5 (*)    HCT 46.1 (*)    All other components within normal limits  URINALYSIS, ROUTINE W REFLEX MICROSCOPIC - Abnormal; Notable for the following components:   APPearance HAZY (*)    Specific Gravity, Urine >1.046 (*)    Protein, ur 30 (*)    All other components within normal limits  LIPASE, BLOOD    EKG None  Radiology CT ABDOMEN PELVIS W CONTRAST Result Date: 04/13/2024 CLINICAL DATA:  Chronic nausea and vomiting with abdominal pain for several weeks, initial encounter EXAM: CT ABDOMEN AND PELVIS WITH CONTRAST TECHNIQUE: Multidetector CT imaging of the abdomen and pelvis was performed using the standard protocol following bolus administration of intravenous contrast. RADIATION DOSE REDUCTION: This exam was performed according to the departmental dose-optimization program which includes automated exposure control, adjustment of the mA and/or kV according to  patient size and/or use of iterative reconstruction technique. CONTRAST:  OMNIPAQUE IOHEXOL 300 MG/ML  SOLN COMPARISON:  04/22/2023 FINDINGS: Lower chest: No acute abnormality.  Right breast implant is noted. Hepatobiliary: Changes consistent with prior cholecystectomy are noted. Mild biliary ductal dilatation is seen. The liver is otherwise within normal limits. Pancreas: Unremarkable. No pancreatic ductal dilatation or surrounding  inflammatory changes. Spleen: Normal in size without focal abnormality. Adrenals/Urinary Tract: Adrenal glands are within normal limits. Kidneys demonstrate a normal enhancement pattern bilaterally. No renal calculi or obstructive changes are seen. The bladder is partially distended. Stomach/Bowel: Fluid is noted throughout the colon. Extensive postsurgical changes are noted similar to that seen on the prior exam. No obstructive changes are seen. Small bowel and stomach are within normal limits. Vascular/Lymphatic: No significant vascular findings are present. No enlarged abdominal or pelvic lymph nodes. Reproductive: Status post hysterectomy. No adnexal masses. Other: No abdominal wall hernia or abnormality. No abdominopelvic ascites. Musculoskeletal: No acute or significant osseous findings. IMPRESSION: Extensive postsurgical changes within the colon. No obstructive changes are seen. Electronically Signed   By: Violeta Grey M.D.   On: 04/13/2024 20:43    Procedures .Critical Care  Performed by: Merdis Stalling, MD Authorized by: Merdis Stalling, MD   Critical care provider statement:    Critical care time (minutes):  45   Critical care was necessary to treat or prevent imminent or life-threatening deterioration of the following conditions:  Dehydration   Critical care was time spent personally by me on the following activities:  Development of treatment plan with patient or surrogate, evaluation of patient's response to treatment, examination of patient, ordering and review of laboratory studies, ordering and review of radiographic studies, ordering and performing treatments and interventions, pulse oximetry, re-evaluation of patient's condition, review of old charts and obtaining history from patient or surrogate   {Document cardiac monitor, telemetry assessment procedure when appropriate:1}  Medications Ordered in ED Medications  potassium chloride (KLOR-CON) packet 40 mEq (40 mEq Oral Given  04/13/24 2138)  metoCLOPramide (REGLAN) injection 10 mg (10 mg Intravenous Given 04/13/24 1803)  lactated ringers bolus 1,000 mL (0 mLs Intravenous Stopped 04/13/24 2126)  iohexol (OMNIPAQUE) 300 MG/ML solution 100 mL (100 mLs Intravenous Contrast Given 04/13/24 1820)  HYDROmorphone (DILAUDID) injection 1 mg (1 mg Intravenous Given 04/13/24 2011)  lactated ringers bolus 1,000 mL (1,000 mLs Intravenous Bolus 04/13/24 2137)  magnesium sulfate IVPB 2 g 50 mL (0 g Intravenous Stopped 04/13/24 2239)    ED Course/ Medical Decision Making/ A&P                          Medical Decision Making Amount and/or Complexity of Data Reviewed Labs: ordered. Decision-making details documented in ED Course. Radiology: ordered. Decision-making details documented in ED Course.  Risk Prescription drug management.    This patient presents to the ED for concern of ***, this involves an extensive number of treatment options, and is a complaint that carries with it a high risk of complications and morbidity.  I considered the following differential and admission for this acute, potentially life threatening condition.   MDM:    ***  Clinical Course as of 04/13/24 2315  Thu Apr 13, 2024  1747 WBC(!): 11.9 Mild leukocytosis [HN]  1747 Hemoglobin(!): 16.5 +volume contraction [HN]  1847 Mild hyponatreima/hypokalemia likely d/t decreased PO intake. [HN]  1847 Creatinine(!): 1.20 +AKI, likely prerenal [HN]  1847 Lipase: 49 neg [HN]  2105 CT ABDOMEN  PELVIS W CONTRAST Extensive postsurgical changes within the colon. No obstructive changes are seen.   [HN]  2106 Specific Gravity, Urine(!): >1.046 Dehydrated, will give another L of fluid [HN]    Clinical Course User Index [HN] Merdis Stalling, MD    Labs: I Ordered, and personally interpreted labs.  The pertinent results include:  those litsed above  Imaging Studies ordered: I ordered imaging studies including CT abd pelvis w contrast I independently  visualized and interpreted imaging. I agree with the radiologist interpretation  Additional history obtained from chart review, husband at bedside.  External records from outside source obtained and reviewed including ***  Cardiac Monitoring: .The patient was maintained on a cardiac monitor.  I personally viewed and interpreted the cardiac monitored which showed an underlying rhythm of: ***  Reevaluation: After the interventions noted above, I reevaluated the patient and found that they have :{resolved/improved/worsened:23923::"improved"}  Social Determinants of Health: .***  Disposition:  ***  Co morbidities that complicate the patient evaluation . Past Medical History:  Diagnosis Date  . Abdominal hernia 08/14/2019  . Abdominal pain 06/06/2019  . Acute bronchitis 05/01/2013  . Acute cystitis 06/09/2010   Qualifier: Diagnosis of  By: Gardenia Jump LPN, Brandi    . Acute renal insufficiency 08/28/2011  . Allergy    Phreesia 03/16/2021  . Anemia    Phreesia 10/29/2020  . Anxiety   . Anxiety state 03/16/2008   Qualifier: Diagnosis of  By: Bullins, Luann    . Arthritis    Phreesia 10/29/2020  . Back pain with radiation 07/17/2014  . Bilateral hand pain 07/27/2016  . Blood transfusion without reported diagnosis    Phreesia 10/29/2020  . Breast cancer (HCC)    L breast- 2006  . Cancer (HCC)    Phreesia 10/29/2020  . Carpal tunnel syndrome   . Chronic constipation   . Chronic pain syndrome    followed by [facility]---  back  . Clotting disorder (HCC)    Phreesia 10/29/2020  . Depression   . Dermatitis 12/15/2011  . Difficult intubation   . Educated about COVID-19 virus infection 05/14/2019  . Fall at home 01/26/2016  . Family history of adverse reaction to anesthesia    MOTHER--- PONV  . Fatigue 09/17/2012  . Fibromyalgia   . FIBROMYALGIA 03/16/2008   Qualifier: Diagnosis of  By: Bullins, Luann    . GERD (gastroesophageal reflux disease)   . Headache disorder  01/16/2013  . Headache syndrome 01/26/2016  . Hip pain, right 07/08/2019  . History of MRSA infection    lip abscess  . History of ovarian cyst 06/2011   s/p  BSO  . History of pulmonary embolus (PE) 1997   post EP with ablation pulmonary veouns for SVT/ Atrial Fib.  . History of supraventricular tachycardia    s/p  ablation 1996  and 1997  by dr Rodolfo Clan  . History of TIA (transient ischemic attack) 1997   post op EP ablation PE  . Hyperlipidemia   . Hyperlipidemia LDL goal <100 03/16/2008   Qualifier: Diagnosis of  By: Bullins, Luann    . Hypothyroidism    followed by pcp  . Incisional hernia   . Insomnia 12/15/2011  . Intermittent palpitations 08/22/2017  . Interstitial cystitis    09-13-2018   per pt last flare-up  May 2019 (followed by pcp)  . INTERSTITIAL CYSTITIS 02/17/2011   Qualifier: Diagnosis of  By: Rodolph Clap MD, Daivd Dub    . Iron deficiency anemia    09-13-2018  PER PT STABLE  . Irregular heart rate 07/25/2013  . Lipoma of back    upper  . Low back pain radiating to right leg 07/04/2019  . Low ferritin level 06/14/2014  . MDD (major depressive disorder), single episode, in full remission (HCC) 10/27/2017  . Medically noncompliant 02/28/2015   Multiple missed appointments, both follow-up appointments and lab appointments.   . Metabolic syndrome X 02/10/2011   Qualifier: Diagnosis of  By: Lodema Hong MD, Margaret  hBA1c is 5.8 in 02/2013   . Migraines   . Morbid obesity (HCC) 03/27/2013  . Muscle spasm 01/03/2020  . Nausea alone 07/17/2014  . NECK PAIN, CHRONIC 03/16/2008   Qualifier: Diagnosis of  By: Lind Guest    . Normal coronary arteries    a. by CT 12/2018.  . Obesity 03/16/2008   Qualifier: Diagnosis of  By: Lind Guest    . Oral ulceration 08/23/2014   Presented at 06/14/2014 visit   . Other malaise and fatigue 03/16/2008   Centricity Description: FATIGUE, CHRONIC Qualifier: Diagnosis of  By: Lind Guest   Centricity Description: FATIGUE  Qualifier: Diagnosis of  By: Arville Lime, Dawn    . OVARIAN CYST 12/26/2009   Qualifier: Diagnosis of  By: Lodema Hong MD, Claris Che    . Paget disease of breast, left (HCC)   . Paget's disease of breast, left (HCC) 03/16/2008   Qualifier: Diagnosis of  By: Lind Guest  Left diagnosed in 2006 F/h breast cancer x 15 family members  . Partial small bowel obstruction (HCC) 07/04/2012  . PONV (postoperative nausea and vomiting)    SEVERE  . Postsurgical menopause 11/13/2011  . Presence of IVC filter 06/06/2019  . PSVT (paroxysmal supraventricular tachycardia) (HCC)    HX ABLATION 1996 AND 1997  . Recurrent oral herpes simplex infection 10/13/2017  . ROM (right otitis media) 01/23/2013  . S/P insertion of IVC (inferior vena caval) filter 05/08/2005   greenfield (non-retrievable)  /  dx 2019  a leg of filter is protruding thru the vena cava in to right L2 vertebral body (09-13-2018  per pt having surgery to remove filter in [place])  . S/P radiofrequency ablation operation for arrhythmia 1996   1996  and 1997,   SVT and Atrial Fib  . Sinusitis, chronic 01/26/2016  . SMALL BOWEL OBSTRUCTION, HX OF 08/07/2008   Annotation: obstruction w/ adhesions led to partial colectomy Qualifier: Diagnosis of  By: Minna Merritts    . Stroke [facility])    Phreesia 10/29/2020, TIAs in the past  . Swelling of hand 09/17/2012  . Syncope   . Tachycardia 02/03/2010   Qualifier: Diagnosis of  By: Via LPN, Larita Fife    . Thyroid disease    Phreesia 10/29/2020  . TOS (thoracic outlet syndrome)   . Ulcer 12/15/2011  . Urinary frequency 07/18/2014  . Vaginitis and vulvovaginitis 05/10/2013  . Vitamin D deficiency 05/05/2015  . Wears glasses      Medicines Meds ordered this encounter  Medications  . metoCLOPramide (REGLAN) injection 10 mg  . lactated ringers bolus 1,000 mL  . iohexol (OMNIPAQUE) 300 MG/ML solution 100 mL  . HYDROmorphone (DILAUDID) injection 1 mg  . lactated ringers bolus 1,000 mL  . potassium  chloride (KLOR-CON) packet 40 mEq  . magnesium sulfate IVPB 2 g 50 mL  . metoCLOPramide (REGLAN) 10 MG tablet    Sig: Take 1 tablet (10 mg total) by mouth every 6 (six) hours.    Dispense:  30 tablet    Refill:  0  I have reviewed the patients home medicines and have made adjustments as needed  Problem List / ED Course: Problem List Items Addressed This Visit       Other   Dehydration   Other Visit Diagnoses       Nausea and vomiting, unspecified vomiting type    -  Primary            {Document critical care time when appropriate:1} {Document review of labs and clinical decision tools ie heart score, Chads2Vasc2 etc:1}  {Document your independent review of radiology images, and any outside records:1} {Document your discussion with family members, caretakers, and with consultants:1} {Document social determinants of health affecting pt's care:1} {Document your decision making why or why not admission, treatments were needed:1}  This note was created using dictation software, which may contain spelling or grammatical errors.

## 2024-04-13 NOTE — Assessment & Plan Note (Signed)
 Has recurrent abdominal pain, nausea Considering her complex GI history, referred to ER for urgent CT abdomen Would need GI evaluation as well

## 2024-04-13 NOTE — Patient Instructions (Addendum)
 Please go to ER for evaluation of abdominal pain and dehydration. You would need IV fluids and CT abdomen.

## 2024-04-13 NOTE — Assessment & Plan Note (Signed)
 Likely due to poor p.o. intake Advised to hold metoprolol for now Needs IV fluids, unable to tolerate oral rehydration

## 2024-04-13 NOTE — ED Triage Notes (Signed)
 Pt's doctor sent here for IV fluids and abd CT.

## 2024-04-13 NOTE — Assessment & Plan Note (Addendum)
 Chronic gastroparesis Has been taking oral Zofran and as needed Phenergan Advised to try Reglan as needed, which was prescribed by [facility] health provider Considering severe fatigue, weakness and hypotension, referred to ER for further management Needs CT abdomen and IV fluids

## 2024-04-13 NOTE — Progress Notes (Signed)
 Acute Office Visit  Subjective:    Patient ID: [name], female    DOB: 28-Jan-1961, 63 y.o.   MRN: [id number]  Chief Complaint  Patient presents with   Nausea    Pt reports nausea and abdominal pain, has been off her meds, can't keep anything down, ongoing for about 5 weeks.     HPI Patient is in today for recent worsening of her chronic nausea and abdominal pain for the last 5 weeks.  She has had poor p.o. intake lately.  She has complicated history of gastroparesis and recurrent small bowel obstruction requiring colon resection.  She has history of postresection malabsorption and has chronic diarrhea due to it.  She takes oral Zofran and Phenergan suppository as needed for nausea.  She has had difficulty standing due to abdominal pain.  Lately, she also has been having dyspnea upon minimal exertion.  Of note, she is also taking Linzess currently, managed by GI.  Past Medical History:  Diagnosis Date   Abdominal hernia 08/14/2019   Abdominal pain 06/06/2019   Acute bronchitis 05/01/2013   Acute cystitis 06/09/2010   Qualifier: Diagnosis of  By: Lillia Mountain LPN, Brandi     Acute renal insufficiency 08/28/2011   Allergy    Phreesia 03/16/2021   Anemia    Phreesia 10/29/2020   Anxiety    Anxiety state 03/16/2008   Qualifier: Diagnosis of  By: Lind Guest     Arthritis    Phreesia 10/29/2020   Back pain with radiation 07/17/2014   Bilateral hand pain 07/27/2016   Blood transfusion without reported diagnosis    Phreesia 10/29/2020   Breast cancer (HCC)    L breast- 2006   Cancer (HCC)    Phreesia 10/29/2020   Carpal tunnel syndrome    Chronic constipation    Chronic pain syndrome    followed by [facility]---  back   Clotting disorder (HCC)    Phreesia 10/29/2020   Depression    Dermatitis 12/15/2011   Difficult intubation    Educated about COVID-19 virus infection 05/14/2019   Fall at home 01/26/2016   Family history of adverse reaction to anesthesia     MOTHER--- PONV   Fatigue 09/17/2012   Fibromyalgia    FIBROMYALGIA 03/16/2008   Qualifier: Diagnosis of  By: Lind Guest     GERD (gastroesophageal reflux disease)    Headache disorder 01/16/2013   Headache syndrome 01/26/2016   Hip pain, right 07/08/2019   History of MRSA infection    lip abscess   History of ovarian cyst 06/2011   s/p  BSO   History of pulmonary embolus (PE) 1997   post EP with ablation pulmonary veouns for SVT/ Atrial Fib.   History of supraventricular tachycardia    s/p  ablation 1996  and 1997  by dr Graciela Husbands   History of TIA (transient ischemic attack) 1997   post op EP ablation PE   Hyperlipidemia    Hyperlipidemia LDL goal <100 03/16/2008   Qualifier: Diagnosis of  By: Lind Guest     Hypothyroidism    followed by pcp   Incisional hernia    Insomnia 12/15/2011   Intermittent palpitations 08/22/2017   Interstitial cystitis    09-13-2018   per pt last flare-up  May 2019 (followed by pcp)   INTERSTITIAL CYSTITIS 02/17/2011   Qualifier: Diagnosis of  By: Lodema Hong MD, Margaret     Iron deficiency anemia    09-13-2018  PER PT STABLE   Irregular  heart rate 07/25/2013   Lipoma of back    upper   Low back pain radiating to right leg 07/04/2019   Low ferritin level 06/14/2014   MDD (major depressive disorder), single episode, in full remission (HCC) 10/27/2017   Medically noncompliant 02/28/2015   Multiple missed appointments, both follow-up appointments and lab appointments.    Metabolic syndrome X 02/10/2011   Qualifier: Diagnosis of  By: Lodema Hong MD, Margaret  hBA1c is 5.8 in 02/2013    Migraines    Morbid obesity (HCC) 03/27/2013   Muscle spasm 01/03/2020   Nausea alone 07/17/2014   NECK PAIN, CHRONIC 03/16/2008   Qualifier: Diagnosis of  By: Lind Guest     Normal coronary arteries    a. by CT 12/2018.   Obesity 03/16/2008   Qualifier: Diagnosis of  By: Lind Guest     Oral ulceration 08/23/2014   Presented at 06/14/2014 visit     Other malaise and fatigue 03/16/2008   Centricity Description: FATIGUE, CHRONIC Qualifier: Diagnosis of  By: Lind Guest   Centricity Description: FATIGUE Qualifier: Diagnosis of  By: Garnette Czech PA, Dawn     OVARIAN CYST 12/26/2009   Qualifier: Diagnosis of  By: Lodema Hong MD, Margaret     Paget disease of breast, left [facility])    Paget's disease of breast, left (HCC) 03/16/2008   Qualifier: Diagnosis of  By: Lind Guest  Left diagnosed in 2006 F/h breast cancer x 15 family members   Partial small bowel obstruction (HCC) 07/04/2012   PONV (postoperative nausea and vomiting)    SEVERE   Postsurgical menopause 11/13/2011   Presence of IVC filter 06/06/2019   PSVT (paroxysmal supraventricular tachycardia) (HCC)    HX ABLATION 1996 AND 1997   Recurrent oral herpes simplex infection 10/13/2017   ROM (right otitis media) 01/23/2013   S/P insertion of IVC (inferior vena caval) filter 05/08/2005   [place] (non-retrievable)  /  dx 2019  a leg of filter is protruding thru the vena cava in to right L2 vertebral body (09-13-2018  per pt having surgery to remove filter in [place])   S/P radiofrequency ablation operation for arrhythmia 1996   1996  and 1997,   SVT and Atrial Fib   Sinusitis, chronic 01/26/2016   SMALL BOWEL OBSTRUCTION, HX OF 08/07/2008   Annotation: obstruction w/ adhesions led to partial colectomy Qualifier: Diagnosis of  By: Minna Merritts     Stroke [facility])    Phreesia 10/29/2020, TIAs in the past   Swelling of hand 09/17/2012   Syncope    Tachycardia 02/03/2010   Qualifier: Diagnosis of  By: Via LPN, Lynn     Thyroid disease    Phreesia 10/29/2020   TOS (thoracic outlet syndrome)    Ulcer 12/15/2011   Urinary frequency 07/18/2014   Vaginitis and vulvovaginitis 05/10/2013   Vitamin D deficiency 05/05/2015   Wears glasses     Past Surgical History:  Procedure Laterality Date   ABDOMINAL HYSTERECTOMY  1987   ANTERIOR CERVICAL DECOMP/DISCECTOMY FUSION  03-07-2002   dr  elsner  @[facility]    C 4 -- 5   APPENDECTOMY  1980   AUGMENTATION MAMMAPLASTY Right 2006   BILATERAL SALPINGOOPHORECTOMY  07/24/2011   via Explor. Lap. w/ intraoperative perf. bowel repair   BIOPSY  05/02/2021   Procedure: BIOPSY;  Surgeon: Marguerita Merles, Reuel Boom, MD;  [facility];  Service: Gastroenterology;;  small bowel, mid esophagus, distal esophagus, random colon biopsies   BIOPSY  12/04/2021   Procedure: BIOPSY;  Surgeon:  Lanelle Bal, DO;  [facility];  Service: Endoscopy;;   BIOPSY  03/31/2022   Procedure: BIOPSY;  Surgeon: Dolores Frame, MD;  [facility];  Service: Gastroenterology;;   BOTOX INJECTION N/A 03/31/2022   Procedure: BOTOX INJECTION;  Surgeon: Dolores Frame, MD;  [facility];  Service: Gastroenterology;  Laterality: N/A;   BREAST BIOPSY Right 2019   benign   BREAST ENHANCEMENT SURGERY Bilateral 1993   BREAST IMPLANT REMOVAL Bilateral    BREAST SURGERY N/A    Phreesia 10/29/2020   CARDIAC CATHETERIZATION  07-06-2003   dr Smitty Cords brodie   normal coronaries and LVF   CARDIAC ELECTROPHYSIOLOGY STUDY AND ABLATION  1996  and 1997   CARDIOVASCULAR STRESS TEST  11-18-2015   dr Graciela Husbands   normal nuclear study w/ no ischemia/  normal LV function and wall motion , ef 84%   CARPAL TUNNEL RELEASE Right ?   CESAREAN SECTION  1986   CESAREAN SECTION N/A    Phreesia 10/29/2020   CHOLECYSTECTOMY N/A    Phreesia 10/29/2020   COLON SURGERY     COLONOSCOPY WITH PROPOFOL N/A 05/02/2021   Procedure: COLONOSCOPY WITH PROPOFOL;  Surgeon: Dolores Frame, MD;  [facility];  Service: Gastroenterology;  Laterality: N/A;  12:30 PM   CYSTO/  HYDRODISTENTION/  INSTILATION THERAPY  MULTIPLE   ENTEROCUTANEOUS FISTULA CLOSURE  multiple   last one 2015 with small bowel resection   ESOPHAGOGASTRODUODENOSCOPY (EGD) WITH PROPOFOL N/A 05/02/2021   Procedure: ESOPHAGOGASTRODUODENOSCOPY (EGD) WITH PROPOFOL;  Surgeon:  Dolores Frame, MD;  [facility];  Service: Gastroenterology;  Laterality: N/A;   ESOPHAGOGASTRODUODENOSCOPY (EGD) WITH PROPOFOL N/A 12/04/2021   Procedure: ESOPHAGOGASTRODUODENOSCOPY (EGD) WITH PROPOFOL;  Surgeon: Lanelle Bal, DO;  [facility];  Service: Endoscopy;  Laterality: N/A;   ESOPHAGOGASTRODUODENOSCOPY (EGD) WITH PROPOFOL N/A 03/31/2022   Procedure: ESOPHAGOGASTRODUODENOSCOPY (EGD) WITH PROPOFOL;  Surgeon: Dolores Frame, MD;  [facility];  Service: Gastroenterology;  Laterality: N/A;  1015   EXPLORATORY LAPAROTOMY INCISIONAL VENTRAL HERNIA REPAIR / RESECTION SMALL BOWEL  11-09-2014   @[facility]    FRACTURE SURGERY N/A    Phreesia 10/29/2020   JOINT REPLACEMENT N/A    Phreesia 10/29/2020   LIPOMA EXCISION Right 09/16/2018   Procedure: EXCISION LIPOMA UPPER BACK;  Surgeon: Berna Bue, MD;  Location: [facility];  Service: General;  Laterality: Right;   MASTECTOMY Left 2006   w/ reconstruction on left (paget's disease)  and right breast augmentation   PERIPHERAL VASCULAR BALLOON ANGIOPLASTY Right 09/13/2023   Procedure: PERIPHERAL VASCULAR BALLOON ANGIOPLASTY;  Surgeon: Maeola Harman, MD;  Location: [facility] INVASIVE CV LAB;  Service: Cardiovascular;  Laterality: Right;  Subclavian and axillary   POLYPECTOMY  05/02/2021   Procedure: POLYPECTOMY;  Surgeon: Dolores Frame, MD;  [facility];  Service: Gastroenterology;;  gastric   POLYPECTOMY  03/31/2022   Procedure: POLYPECTOMY;  Surgeon: Dolores Frame, MD;  [facility];  Service: Gastroenterology;;   Gaspar Bidding DILATION  05/02/2021   Procedure: Gaspar Bidding DILATION;  Surgeon: Dolores Frame, MD;  [facility];  Service: Gastroenterology;;   SMALL INTESTINE SURGERY N/A    Phreesia 10/29/2020   SPINE SURGERY N/A    Phreesia 10/29/2020   TOTAL COLECTOMY  08-04-2002    @[facility]    AND CHOLECYSTECTOMY   (colonic inertia)   TRANSTHORACIC ECHOCARDIOGRAM  02/04/2016   ef 60-65%,  grade 2 diastolic dysfunction/  mild MR   TUBAL LIGATION N/A    Phreesia 03/16/2021   UPPER EXTREMITY VENOGRAPHY N/A 09/13/2023   Procedure: UPPER EXTREMITY VENOGRAPHY;  Surgeon: Maeola Harman, MD;  Location: [facility] INVASIVE CV LAB;  Service: Cardiovascular;  Laterality: N/A;   VENA CAVA FILTER PLACEMENT  05/08/2005   @[facility]    [place] (non-retrievable)   WIDE EXCISION PERIRECTAL ABSCESSES  09-22-2005   @ [facility]    Family History  Problem Relation Age of Onset   Stroke Mother    Migraines Mother    Congestive Heart Failure Mother    Diabetes Mother    Heart disease Mother    Hypertension Mother    Other Mother        ketoacidosis   Heart disease Father    Hyperlipidemia Father    Hypertension Father    Alcohol abuse Father    Congestive Heart Failure Father    Colon cancer Maternal Aunt    Breast cancer Maternal Aunt    Breast cancer Maternal Aunt    Congestive Heart Failure Maternal Aunt    Cancer Maternal Aunt    Heart attack Maternal Aunt    Breast cancer Maternal Aunt    Cancer Maternal Aunt    Rheum arthritis Maternal Aunt    Breast cancer Maternal Aunt    Breast cancer Maternal Aunt    Cancer Maternal Uncle        mets   Prostate cancer Maternal Uncle    Migraines Maternal Grandmother    Bone cancer Maternal Grandfather        mets   Migraines Daughter    Supraventricular tachycardia Daughter    Ovarian cancer Cousin 47   Breast cancer Cousin     Social History   Socioeconomic History   Marital status: Married    Spouse name: Not on file   Number of children: Not on file   Years of education: Not on file   Highest education level: 12th grade  Occupational History   Not on file  Tobacco Use   Smoking status: Never   Smokeless tobacco: Never  Vaping Use   Vaping status: Never Used  Substance and Sexual Activity   Alcohol use: No   Drug use: No   Sexual activity:  Not Currently    Birth control/protection: Surgical    Comment: hyst  Other Topics Concern   Not on file  Social History Narrative   Right handed   Social Drivers of Health   Financial Resource Strain: Low Risk  (02/03/2024)   Received from [facility]   Overall Financial Resource Strain (CARDIA)    Difficulty of Paying Living Expenses: Not hard at all  Recent Concern: Financial Resource Strain - Medium Risk (01/03/2024)   Overall Financial Resource Strain (CARDIA)    Difficulty of Paying Living Expenses: Somewhat hard  Food Insecurity: No Food Insecurity (02/03/2024)   Received from [facility]   Hunger Vital Sign    Worried About Running Out of Food in the Last Year: Never true    Ran Out of Food in the Last Year: Never true  Recent Concern: Food Insecurity - Food Insecurity Present (01/03/2024)   Hunger Vital Sign    Worried About Running Out of Food in the Last Year: Sometimes true    Ran Out of Food in the Last Year: Sometimes true  Transportation Needs: No Transportation Needs (02/03/2024)   Received from [facility] - Transportation    Lack  of Transportation (Medical): No    Lack of Transportation (Non-Medical): No  Physical Activity: Inactive (02/03/2024)   Received from [facility]   Exercise Vital Sign    Days of Exercise per Week: 0 days    Minutes of Exercise per Session: 0 min  Stress: No Stress Concern Present (02/03/2024)   Received from [facility] of Occupational [facility] - Occupational Stress Questionnaire    Feeling of Stress : Only a little  Recent Concern: Stress - Stress Concern Present (01/03/2024)   [employer] of Occupational Health - Occupational Stress Questionnaire    Feeling of Stress : Rather much  Social Connections: Socially Integrated (02/03/2024)   Received from [facility]   Social Connection and Isolation Panel [NHANES]    Frequency of Communication with Friends and Family: Twice a week     Frequency of Social Gatherings with Friends and Family: Once a week    Attends Religious Services: 1 to 4 times per year    Active Member of [employer] or Organizations: Yes    Attends Banker Meetings: 1 to 4 times per year    Marital Status: Married  Catering manager Violence: Not At Risk (02/03/2024)   Received from [facility]   Humiliation, Afraid, Rape, and Kick questionnaire    Fear of Current or Ex-Partner: No    Emotionally Abused: No    Physically Abused: No    Sexually Abused: No    Outpatient Medications Prior to Visit  Medication Sig Dispense Refill   acyclovir (ZOVIRAX) 800 MG tablet Take 1 tablet (800 mg total) by mouth 5 (five) times daily. 50 tablet 1   ALPRAZolam (XANAX) 1 MG tablet Take 1 tablet (1 mg total) by mouth 2 (two) times daily as needed for anxiety. (Patient taking differently: Take 1 mg by mouth 2 (two) times daily.) 60 tablet 5   aspirin 81 MG chewable tablet Chew 81 mg by mouth daily.     botulinum toxin Type A (BOTOX) 200 units injection Inject 155 units IM into multiple site in the face,neck and head once every 90 days 1 each 4   butalbital-acetaminophen-caffeine (FIORICET) 50-325-40 MG tablet Take 1 tablet by mouth every 6 (six) hours as needed for headache.     conjugated estrogens (PREMARIN) vaginal cream INSERT pea sized amount three times weekly intravaginally 30 g 2   cyanocobalamin (VITAMIN B12) 1000 MCG/ML injection ADMINISTER 1 ML(1000 MCG) IN THE MUSCLE EVERY 30 DAYS 1 mL 11   cyclobenzaprine (FLEXERIL) 10 MG tablet Take 1 tablet (10 mg total) by mouth 2 (two) times daily as needed for muscle spasms. (Patient taking differently: Take 10 mg by mouth 2 (two) times daily.) 90 tablet 3   cycloSPORINE (RESTASIS) 0.05 % ophthalmic emulsion Place 1 drop into both eyes 2 (two) times daily as needed (dry eyes).     DULoxetine (CYMBALTA) 60 MG capsule Take 1 capsule (60 mg total) by mouth daily. 30 capsule 3   ezetimibe (ZETIA) 10 MG tablet TAKE 1  TABLET(10 MG) BY MOUTH DAILY (Patient taking differently: Take 10 mg by mouth at bedtime.) 90 tablet 3   fenofibrate (TRICOR) 48 MG tablet TAKE 1 TABLET(48 MG) BY MOUTH DAILY (Patient taking differently: Take 48 mg by mouth at bedtime.) 30 tablet 5   furosemide (LASIX) 20 MG tablet TAKE 1 TABLET(20 MG) BY MOUTH DAILY 30 tablet 3   HYDROmorphone (DILAUDID) 4 MG tablet Take 4 mg by mouth 3 (three)  times daily.     INTRAROSA 6.5 MG INST Place 1 suppository vaginally at bedtime.     Lactobacillus (CULTURELLE DIGESTIVE WOMENS PO) Take 1 capsule by mouth daily.     levothyroxine (SYNTHROID) 125 MCG tablet TAKE 1 TABLET(125 MCG) BY MOUTH DAILY BEFORE BREAKFAST 90 tablet 1   linaclotide (LINZESS) 290 MCG CAPS capsule Take 1 capsule (290 mcg total) by mouth daily before breakfast. 30 capsule 11   magnesium oxide (MAG-OX) 400 (240 Mg) MG tablet TAKE 1 TABLET BY MOUTH FOUR TIMES DAILY 180 tablet 0   MegaRed Omega-3 Krill Oil 500 MG CAPS Take 500 mg by mouth at bedtime.     Melatonin 10 MG TABS Take 10 mg by mouth at bedtime.     metFORMIN (GLUCOPHAGE-XR) 500 MG 24 hr tablet Take 500 mg by mouth 2 (two) times daily with a meal.     metoprolol succinate (TOPROL-XL) 25 MG 24 hr tablet TAKE 1 TABLET(25 MG) BY MOUTH DAILY (Patient taking differently: Take 25 mg by mouth 2 (two) times daily as needed (heart racing).) 90 tablet 2   Multiple Vitamins-Minerals (ICAPS AREDS 2 PO) Take 1 capsule by mouth at bedtime.     naloxone (NARCAN) nasal spray 4 mg/0.1 mL Place 1 spray into the nose as needed (opioid reversal).     omeprazole (PRILOSEC) 40 MG capsule TAKE 1 CAPSULE(40 MG) BY MOUTH IN THE MORNING AND AT BEDTIME 180 capsule 3   OVER THE COUNTER MEDICATION Take 2 tablets by mouth 2 (two) times daily. Nutra-ful     OVER THE COUNTER MEDICATION Take 1 capsule by mouth at bedtime. Blink Eye Supplement for Dry eyes     phenazopyridine (PYRIDIUM) 95 MG tablet Take by mouth 3 (three) times daily as needed. sd     Polyethyl  Glycol-Propyl Glycol (SYSTANE ULTRA) 0.4-0.3 % SOLN Place 1 drop into both eyes as needed (dry eyes).     polyethylene glycol (MIRALAX / GLYCOLAX) 17 g packet Take 17 g by mouth daily as needed for mild constipation or moderate constipation.     potassium chloride (KLOR-CON M) 10 MEQ tablet TAKE 1 TABLET(10 MEQ) BY MOUTH TWICE DAILY 180 tablet 3   pravastatin (PRAVACHOL) 20 MG tablet TAKE 1 TABLET(20 MG) BY MOUTH DAILY (Patient taking differently: Take 20 mg by mouth at bedtime.) 90 tablet 1   promethazine (PHENERGAN) 12.5 MG tablet Take 12.5 mg by mouth every 6 (six) hours as needed for nausea or vomiting.     sennosides-docusate sodium (SENOKOT-S) 8.6-50 MG tablet Take 2 tablets by mouth at bedtime.     Vitamin D, Ergocalciferol, (DRISDOL) 1.25 MG (50000 UNIT) CAPS capsule Take 50,000 Units by mouth every 30 (thirty) days.     XARELTO 20 MG TABS tablet TAKE 1 TABLET(20 MG) BY MOUTH DAILY 30 tablet 3   methylPREDNISolone acetate (DEPO-MEDROL) injection 40 mg      No facility-administered medications prior to visit.    Allergies  Allergen Reactions   Nitrofurantoin Hives   Crestor [Rosuvastatin Calcium] Other (See Comments)    Generalized cramps   Bisacodyl Other (See Comments)    Makes patient feel like she is having cramps    Codeine Itching   Iron Sucrose Other (See Comments)    Flushing- required benadryl and solu medrol   Monosodium Glutamate Other (See Comments)    Cluster migraines    Scopolamine Hbr Other (See Comments)    Cluster migraines, impaired vision   Morphine Rash   Other Hives, Itching, Swelling,  Rash and Other (See Comments)    Cigarette smoke Causes Migraines     Review of Systems  Constitutional:  Positive for fatigue. Negative for fever.  HENT:  Negative for congestion, sinus pressure, sore throat and trouble swallowing.   Respiratory:  Positive for shortness of breath. Negative for cough.   Cardiovascular:  Negative for chest pain and palpitations.   Gastrointestinal:  Positive for abdominal pain, diarrhea, nausea and vomiting.  Genitourinary:  Negative for dysuria and hematuria.  Musculoskeletal:  Negative for neck pain and neck stiffness.  Skin:  Negative for rash.  Neurological:  Positive for dizziness and weakness.  Psychiatric/Behavioral:  Negative for agitation and behavioral problems.        Objective:    Physical Exam Vitals reviewed.  Constitutional:      General: She is in acute distress.     Appearance: She is not diaphoretic.  HENT:     Head: Normocephalic and atraumatic.     Nose: Nose normal.     Mouth/Throat:     Mouth: Mucous membranes are moist.  Eyes:     General: No scleral icterus.    Extraocular Movements: Extraocular movements intact.  Cardiovascular:     Rate and Rhythm: Normal rate and regular rhythm.     Heart sounds: Normal heart sounds. No murmur heard. Pulmonary:     Breath sounds: Normal breath sounds. No wheezing or rales.  Abdominal:     Palpations: Abdomen is soft.     Tenderness: There is abdominal tenderness (Epigastric).     Comments: Hyperactive bowel sounds  Musculoskeletal:     Cervical back: Neck supple. No tenderness.     Right lower leg: No edema.     Left lower leg: No edema.  Skin:    General: Skin is warm.     Findings: No rash.  Neurological:     General: No focal deficit present.     Mental Status: She is alert and oriented to person, place, and time.     Motor: Weakness (Generalized) present.  Psychiatric:        Mood and Affect: Mood normal.        Behavior: Behavior normal.     BP 96/69   Pulse (!) 102   Ht 5\' 4"  (1.626 m)   Wt 184 lb 9.6 oz (83.7 kg)   SpO2 97%   BMI 31.69 kg/m  Wt Readings from Last 3 Encounters:  04/13/24 184 lb 9.6 oz (83.7 kg)  04/04/24 198 lb (89.8 kg)  03/22/24 190 lb (86.2 kg)        Assessment & Plan:   Problem List Items Addressed This Visit       Cardiovascular and Mediastinum   Hypotensive episode   Likely due to  poor p.o. intake Advised to hold metoprolol for now Needs IV fluids, unable to tolerate oral rehydration        Digestive   Post-resection malabsorption   History of multiple SBO requiring bowel resection      Gastroparesis - Primary   Chronic gastroparesis Has been taking oral Zofran and as needed Phenergan Advised to try Reglan as needed, which was prescribed by [facility] health provider Considering severe fatigue, weakness and hypotension, referred to ER for further management Needs CT abdomen and IV fluids      Generalized dysmotility of intestine   Has recurrent abdominal pain, nausea Considering her complex GI history, referred to ER for urgent CT abdomen Would need GI evaluation as well  No orders of the defined types were placed in this encounter.    Meldon Sport, MD

## 2024-04-13 NOTE — ED Provider Notes (Signed)
 [place] EMERGENCY DEPARTMENT AT [facility] Provider Note   CSN: [id number] Arrival date & time: 04/13/24  1432     History {Add pertinent medical, surgical, social history, OB history to HPI:1} No chief complaint on file.   [name] is a 63 y.o. female with PMH as listed below who presents with h/o gastroparesis, p/w three weeks of increased nausea/vomiting, worsening today. Having biliary emesis today. Can't keep anything down. Has h/o extensive abdominal surgeries. Feels extremely lightheaded when she stands, feels palpitations with standing, feels like she is going to pass out. Hasn't fallen or hit her head. No CP. Endorses epigastric abdominal pain especially with heaving. Was sent by PCP for fluids/CT scan.    Past Medical History:  Diagnosis Date  . Abdominal hernia 08/14/2019  . Abdominal pain 06/06/2019  . Acute bronchitis 05/01/2013  . Acute cystitis 06/09/2010   Qualifier: Diagnosis of  By: Gardenia Jump LPN, Brandi    . Acute renal insufficiency 08/28/2011  . Allergy    Phreesia 03/16/2021  . Anemia    Phreesia 10/29/2020  . Anxiety   . Anxiety state 03/16/2008   Qualifier: Diagnosis of  By: Bullins, Luann    . Arthritis    Phreesia 10/29/2020  . Back pain with radiation 07/17/2014  . Bilateral hand pain 07/27/2016  . Blood transfusion without reported diagnosis    Phreesia 10/29/2020  . Breast cancer (HCC)    L breast- 2006  . Cancer (HCC)    Phreesia 10/29/2020  . Carpal tunnel syndrome   . Chronic constipation   . Chronic pain syndrome    followed by [facility]---  back  . Clotting disorder (HCC)    Phreesia 10/29/2020  . Depression   . Dermatitis 12/15/2011  . Difficult intubation   . Educated about COVID-19 virus infection 05/14/2019  . Fall at home 01/26/2016  . Family history of adverse reaction to anesthesia    MOTHER--- PONV  . Fatigue 09/17/2012  . Fibromyalgia   . FIBROMYALGIA 03/16/2008   Qualifier: Diagnosis of   By: Bullins, Luann    . GERD (gastroesophageal reflux disease)   . Headache disorder 01/16/2013  . Headache syndrome 01/26/2016  . Hip pain, right 07/08/2019  . History of MRSA infection    lip abscess  . History of ovarian cyst 06/2011   s/p  BSO  . History of pulmonary embolus (PE) 1997   post EP with ablation pulmonary veouns for SVT/ Atrial Fib.  . History of supraventricular tachycardia    s/p  ablation 1996  and 1997  by dr Rodolfo Clan  . History of TIA (transient ischemic attack) 1997   post op EP ablation PE  . Hyperlipidemia   . Hyperlipidemia LDL goal <100 03/16/2008   Qualifier: Diagnosis of  By: Bullins, Luann    . Hypothyroidism    followed by pcp  . Incisional hernia   . Insomnia 12/15/2011  . Intermittent palpitations 08/22/2017  . Interstitial cystitis    09-13-2018   per pt last flare-up  May 2019 (followed by pcp)  . INTERSTITIAL CYSTITIS 02/17/2011   Qualifier: Diagnosis of  By: Rodolph Clap MD, Daivd Dub    . Iron  deficiency anemia    09-13-2018  PER PT STABLE  . Irregular heart rate 07/25/2013  . Lipoma of back    upper  . Low back pain radiating to right leg 07/04/2019  . Low ferritin level 06/14/2014  . MDD (major depressive disorder), single episode, in full remission (HCC) 10/27/2017  .  Medically noncompliant 02/28/2015   Multiple missed appointments, both follow-up appointments and lab appointments.   . Metabolic syndrome X 02/10/2011   Qualifier: Diagnosis of  By: Rodolph Clap MD, Margaret  hBA1c is 5.8 in 02/2013   . Migraines   . Morbid obesity (HCC) 03/27/2013  . Muscle spasm 01/03/2020  . Nausea alone 07/17/2014  . NECK PAIN, CHRONIC 03/16/2008   Qualifier: Diagnosis of  By: Almetta Jacquet    . Normal coronary arteries    a. by CT 12/2018.  . Obesity 03/16/2008   Qualifier: Diagnosis of  By: Almetta Jacquet    . Oral ulceration 08/23/2014   Presented at 06/14/2014 visit   . Other malaise and fatigue 03/16/2008   Centricity Description: FATIGUE, CHRONIC  Qualifier: Diagnosis of  By: Almetta Jacquet   Centricity Description: FATIGUE Qualifier: Diagnosis of  By: Brion Cancel, Dawn    . OVARIAN CYST 12/26/2009   Qualifier: Diagnosis of  By: Rodolph Clap MD, Daivd Dub    . Paget disease of breast, left (HCC)   . Paget's disease of breast, left (HCC) 03/16/2008   Qualifier: Diagnosis of  By: Almetta Jacquet  Left diagnosed in 2006 F/h breast cancer x 15 family members  . Partial small bowel obstruction (HCC) 07/04/2012  . PONV (postoperative nausea and vomiting)    SEVERE  . Postsurgical menopause 11/13/2011  . Presence of IVC filter 06/06/2019  . PSVT (paroxysmal supraventricular tachycardia) (HCC)    HX ABLATION 1996 AND 1997  . Recurrent oral herpes simplex infection 10/13/2017  . ROM (right otitis media) 01/23/2013  . S/P insertion of IVC (inferior vena caval) filter 05/08/2005   greenfield (non-retrievable)  /  dx 2019  a leg of filter is protruding thru the vena cava in to right L2 vertebral body (09-13-2018  per pt having surgery to remove filter in [place])  . S/P radiofrequency ablation operation for arrhythmia 1996   1996  and 1997,   SVT and Atrial Fib  . Sinusitis, chronic 01/26/2016  . SMALL BOWEL OBSTRUCTION, HX OF 08/07/2008   Annotation: obstruction w/ adhesions led to partial colectomy Qualifier: Diagnosis of  By: Kenney Peacemaker    . Stroke [facility])    Phreesia 10/29/2020, TIAs in the past  . Swelling of hand 09/17/2012  . Syncope   . Tachycardia 02/03/2010   Qualifier: Diagnosis of  By: Via LPN, Tanis Fan    . Thyroid disease    Phreesia 10/29/2020  . TOS (thoracic outlet syndrome)   . Ulcer 12/15/2011  . Urinary frequency 07/18/2014  . Vaginitis and vulvovaginitis 05/10/2013  . Vitamin D deficiency 05/05/2015  . Wears glasses        Home Medications Prior to Admission medications   Medication Sig Start Date End Date Taking? Authorizing Provider  ALPRAZolam (XANAX) 1 MG tablet Take 1 tablet (1 mg total) by mouth 2 (two) times  daily as needed for anxiety. Patient taking differently: Take 1 mg by mouth 2 (two) times daily. 01/29/24  Yes Towanda Fret, MD  aspirin 81 MG chewable tablet Chew 81 mg by mouth daily.   Yes [provider]  botulinum toxin Type A (BOTOX) 200 units injection Inject 155 units IM into multiple site in the face,neck and head once every 90 days 04/06/24  Yes Merriam Abbey, DO  butalbital-acetaminophen-caffeine (FIORICET) 50-325-40 MG tablet Take 1 tablet by mouth every 6 (six) hours as needed for headache. 04/11/22  Yes [provider]  conjugated estrogens (PREMARIN) vaginal cream INSERT pea sized amount three  times weekly intravaginally 03/14/24  Yes Kerri Perches, MD  cyanocobalamin (VITAMIN B12) 1000 MCG/ML injection ADMINISTER 1 ML(1000 MCG) IN THE MUSCLE EVERY 30 DAYS 12/14/23  Yes Pennington, Rebekah M, PA-C  cyclobenzaprine (FLEXERIL) 10 MG tablet Take 1 tablet (10 mg total) by mouth 2 (two) times daily as needed for muscle spasms. Patient taking differently: Take 10 mg by mouth 2 (two) times daily. 01/04/24  Yes Del Newman Nip, Tenna Child, FNP  cycloSPORINE (RESTASIS) 0.05 % ophthalmic emulsion Place 1 drop into both eyes 2 (two) times daily as needed (dry eyes).   Yes [provider]  DULoxetine (CYMBALTA) 60 MG capsule Take 1 capsule (60 mg total) by mouth daily. 01/04/24  Yes Del Newman Nip, Tenna Child, FNP  ezetimibe (ZETIA) 10 MG tablet TAKE 1 TABLET(10 MG) BY MOUTH DAILY Patient taking differently: Take 10 mg by mouth at bedtime. 10/11/23  Yes Kerri Perches, MD  fenofibrate (TRICOR) 48 MG tablet TAKE 1 TABLET(48 MG) BY MOUTH DAILY Patient taking differently: Take 48 mg by mouth at bedtime. 01/24/24  Yes Kerri Perches, MD  furosemide (LASIX) 20 MG tablet TAKE 1 TABLET(20 MG) BY MOUTH DAILY 03/13/24  Yes Kerri Perches, MD  HYDROmorphone (DILAUDID) 4 MG tablet Take 4 mg by mouth 3 (three) times daily.   Yes [provider]  INTRAROSA 6.5  MG INST Place 1 suppository vaginally at bedtime. 01/21/24  Yes [provider]  Lactobacillus (CULTURELLE DIGESTIVE WOMENS PO) Take 1 capsule by mouth daily.   Yes [provider]  levothyroxine (SYNTHROID) 125 MCG tablet TAKE 1 TABLET(125 MCG) BY MOUTH DAILY BEFORE BREAKFAST 12/06/23  Yes Kerri Perches, MD  linaclotide [facility]) 290 MCG CAPS capsule Take 1 capsule (290 mcg total) by mouth daily before breakfast. 01/24/24  Yes Marguerita Merles, Daniel, MD  magnesium oxide (MAG-OX) 400 (240 Mg) MG tablet TAKE 1 TABLET BY MOUTH FOUR TIMES DAILY 03/03/24  Yes Kerri Perches, MD  MegaRed Omega-3 Krill Oil 500 MG CAPS Take 500 mg by mouth at bedtime.   Yes [provider]  Melatonin 10 MG TABS Take 5 mg by mouth at bedtime.   Yes [provider]  metFORMIN (GLUCOPHAGE-XR) 500 MG 24 hr tablet Take 500 mg by mouth 2 (two) times daily with a meal.   Yes [provider]  metoCLOPramide (REGLAN) 10 MG tablet Take 1 tablet (10 mg total) by mouth every 6 (six) hours. 04/13/24  Yes Loetta Rough, MD  Multiple Vitamins-Minerals (ICAPS AREDS 2 PO) Take 1 capsule by mouth at bedtime.   Yes [provider]  naloxone (NARCAN) nasal spray 4 mg/0.1 mL Place 1 spray into the nose as needed (opioid reversal). 01/15/23  Yes [provider]  omeprazole (PRILOSEC) 40 MG capsule TAKE 1 CAPSULE(40 MG) BY MOUTH IN THE MORNING AND AT BEDTIME 02/04/24  Yes Kerri Perches, MD  ondansetron (ZOFRAN-ODT) 4 MG disintegrating tablet Take 4 mg by mouth every 8 (eight) hours as needed for nausea or vomiting. 04/10/24  Yes [provider]  OVER THE COUNTER MEDICATION Take 2 tablets by mouth 2 (two) times daily. Nutra-ful   Yes [provider]  Polyethyl Glycol-Propyl Glycol (SYSTANE ULTRA) 0.4-0.3 % SOLN Place 1 drop into both eyes as needed (dry eyes).   Yes [provider]  potassium chloride (KLOR-CON M) 10 MEQ tablet TAKE 1 TABLET(10  MEQ) BY MOUTH TWICE DAILY Patient taking differently: Take 10 mEq by mouth 3 (three) times daily. 02/04/24  Yes  Towanda Fret, MD  pravastatin  (PRAVACHOL ) 20 MG tablet TAKE 1 TABLET(20 MG) BY MOUTH DAILY Patient taking differently: Take 20 mg by mouth at bedtime. 11/02/23  Yes Towanda Fret, MD  PROMETHEGAN 25 MG suppository Place 25 mg rectally every 6 (six) hours as needed for nausea or vomiting. 04/10/24  Yes [provider]  Vitamin D , Ergocalciferol , (DRISDOL ) 1.25 MG (50000 UNIT) CAPS capsule Take 50,000 Units by mouth every 30 (thirty) days.   Yes [provider]  XARELTO  20 MG TABS tablet TAKE 1 TABLET(20 MG) BY MOUTH DAILY 03/20/24  Yes Towanda Fret, MD  ALPRAZolam  (XANAX ) 1 MG tablet Take 1 tablet (1 mg total) by mouth 2 (two) times daily. 07/18/23   Towanda Fret, MD      Allergies    Nitrofurantoin, Crestor  [rosuvastatin  calcium ], Bisacodyl , Codeine, Iron  sucrose, Monosodium glutamate, Scopolamine hbr, Morphine , and Other    Review of Systems   Review of Systems A 10 point review of systems was performed and is negative unless otherwise reported in HPI.  Physical Exam Updated Vital Signs BP 110/71   Pulse 79   Temp 98.2 F (36.8 C) (Oral)   Resp 11   Ht 5\' 4"  (1.626 m)   Wt 83 kg   SpO2 99%   BMI 31.41 kg/m  Physical Exam General: Normal appearing {Desc; female/female:11659}, lying in bed.  HEENT: PERRLA, Sclera anicteric, MMM, trachea midline.  Cardiology: RRR, no murmurs/rubs/gallops. BL radial and DP pulses equal bilaterally.  Resp: Normal respiratory rate and effort. CTAB, no wheezes, rhonchi, crackles.  Abd: Soft, non-tender, non-distended. No rebound tenderness or guarding.  GU: Deferred. MSK: No peripheral edema or signs of trauma. Extremities without deformity or TTP. No cyanosis or clubbing. Skin: warm, dry. No rashes or lesions. Back: No CVA tenderness Neuro: A&Ox4, CNs II-XII grossly intact. MAEs. Sensation grossly  intact.  Psych: Normal mood and affect.   ED Results / Procedures / Treatments   Labs (all labs ordered are listed, but only abnormal results are displayed) Labs Reviewed  COMPREHENSIVE METABOLIC PANEL WITH GFR - Abnormal; Notable for the following components:      Result Value   Sodium 131 (*)    Potassium 3.4 (*)    CO2 14 (*)    Glucose, Bld 120 (*)    BUN 35 (*)    Creatinine, Ser 1.20 (*)    Calcium  10.4 (*)    GFR, Estimated 51 (*)    All other components within normal limits  CBC - Abnormal; Notable for the following components:   WBC 11.9 (*)    RBC 5.30 (*)    Hemoglobin 16.5 (*)    HCT 46.1 (*)    All other components within normal limits  URINALYSIS, ROUTINE W REFLEX MICROSCOPIC - Abnormal; Notable for the following components:   APPearance HAZY (*)    Specific Gravity, Urine >1.046 (*)    Protein, ur 30 (*)    All other components within normal limits  LIPASE, BLOOD    EKG None  Radiology CT ABDOMEN PELVIS W CONTRAST Result Date: 04/13/2024 CLINICAL DATA:  Chronic nausea and vomiting with abdominal pain for several weeks, initial encounter EXAM: CT ABDOMEN AND PELVIS WITH CONTRAST TECHNIQUE: Multidetector CT imaging of the abdomen and pelvis was performed using the standard protocol following bolus administration of intravenous contrast. RADIATION DOSE REDUCTION: This exam was performed according to the departmental dose-optimization program which includes automated exposure control, adjustment of the mA and/or kV according to  patient size and/or use of iterative reconstruction technique. CONTRAST:  OMNIPAQUE  IOHEXOL  300 MG/ML  SOLN COMPARISON:  04/22/2023 FINDINGS: Lower chest: No acute abnormality.  Right breast implant is noted. Hepatobiliary: Changes consistent with prior cholecystectomy are noted. Mild biliary ductal dilatation is seen. The liver is otherwise within normal limits. Pancreas: Unremarkable. No pancreatic ductal dilatation or surrounding  inflammatory changes. Spleen: Normal in size without focal abnormality. Adrenals/Urinary Tract: Adrenal glands are within normal limits. Kidneys demonstrate a normal enhancement pattern bilaterally. No renal calculi or obstructive changes are seen. The bladder is partially distended. Stomach/Bowel: Fluid is noted throughout the colon. Extensive postsurgical changes are noted similar to that seen on the prior exam. No obstructive changes are seen. Small bowel and stomach are within normal limits. Vascular/Lymphatic: No significant vascular findings are present. No enlarged abdominal or pelvic lymph nodes. Reproductive: Status post hysterectomy. No adnexal masses. Other: No abdominal wall hernia or abnormality. No abdominopelvic ascites. Musculoskeletal: No acute or significant osseous findings. IMPRESSION: Extensive postsurgical changes within the colon. No obstructive changes are seen. Electronically Signed   By: Violeta Grey M.D.   On: 04/13/2024 20:43    Procedures .Critical Care  Performed by: Merdis Stalling, MD Authorized by: Merdis Stalling, MD   Critical care provider statement:    Critical care time (minutes):  45   Critical care was necessary to treat or prevent imminent or life-threatening deterioration of the following conditions:  Dehydration   Critical care was time spent personally by me on the following activities:  Development of treatment plan with patient or surrogate, evaluation of patient's response to treatment, examination of patient, ordering and review of laboratory studies, ordering and review of radiographic studies, ordering and performing treatments and interventions, pulse oximetry, re-evaluation of patient's condition, review of old charts and obtaining history from patient or surrogate   {Document cardiac monitor, telemetry assessment procedure when appropriate:1}  Medications Ordered in ED Medications  potassium chloride  (KLOR-CON ) packet 40 mEq (40 mEq Oral Given  04/13/24 2138)  metoCLOPramide  (REGLAN ) injection 10 mg (10 mg Intravenous Given 04/13/24 1803)  lactated ringers  bolus 1,000 mL (0 mLs Intravenous Stopped 04/13/24 2126)  iohexol  (OMNIPAQUE ) 300 MG/ML solution 100 mL (100 mLs Intravenous Contrast Given 04/13/24 1820)  HYDROmorphone  (DILAUDID ) injection 1 mg (1 mg Intravenous Given 04/13/24 2011)  lactated ringers  bolus 1,000 mL (1,000 mLs Intravenous Bolus 04/13/24 2137)  magnesium  sulfate IVPB 2 g 50 mL (0 g Intravenous Stopped 04/13/24 2239)    ED Course/ Medical Decision Making/ A&P                          Medical Decision Making Amount and/or Complexity of Data Reviewed Labs: ordered. Decision-making details documented in ED Course. Radiology: ordered. Decision-making details documented in ED Course.  Risk Prescription drug management.    This patient presents to the ED for concern of ***, this involves an extensive number of treatment options, and is a complaint that carries with it a high risk of complications and morbidity.  I considered the following differential and admission for this acute, potentially life threatening condition.   MDM:    ***  Clinical Course as of 04/13/24 2315  Thu Apr 13, 2024  1747 WBC(!): 11.9 Mild leukocytosis [HN]  1747 Hemoglobin(!): 16.5 +volume contraction [HN]  1847 Mild hyponatreima/hypokalemia likely d/t decreased PO intake. [HN]  1847 Creatinine(!): 1.20 +AKI, likely prerenal [HN]  1847 Lipase: 49 neg [HN]  2105 CT ABDOMEN  PELVIS W CONTRAST Extensive postsurgical changes within the colon. No obstructive changes are seen.   [HN]  2106 Specific Gravity, Urine(!): >1.046 Dehydrated, will give another L of fluid [HN]    Clinical Course User Index [HN] Merdis Stalling, MD    Labs: I Ordered, and personally interpreted labs.  The pertinent results include:  those litsed above  Imaging Studies ordered: I ordered imaging studies including CT abd pelvis w contrast I independently  visualized and interpreted imaging. I agree with the radiologist interpretation  Additional history obtained from chart review, husband at bedside.  External records from outside source obtained and reviewed including ***  Cardiac Monitoring: .The patient was maintained on a cardiac monitor.  I personally viewed and interpreted the cardiac monitored which showed an underlying rhythm of: ***  Reevaluation: After the interventions noted above, I reevaluated the patient and found that they have :{resolved/improved/worsened:23923::"improved"}  Social Determinants of Health: .***  Disposition:  ***  Co morbidities that complicate the patient evaluation . Past Medical History:  Diagnosis Date  . Abdominal hernia 08/14/2019  . Abdominal pain 06/06/2019  . Acute bronchitis 05/01/2013  . Acute cystitis 06/09/2010   Qualifier: Diagnosis of  By: Gardenia Jump LPN, Brandi    . Acute renal insufficiency 08/28/2011  . Allergy    Phreesia 03/16/2021  . Anemia    Phreesia 10/29/2020  . Anxiety   . Anxiety state 03/16/2008   Qualifier: Diagnosis of  By: Bullins, Luann    . Arthritis    Phreesia 10/29/2020  . Back pain with radiation 07/17/2014  . Bilateral hand pain 07/27/2016  . Blood transfusion without reported diagnosis    Phreesia 10/29/2020  . Breast cancer (HCC)    L breast- 2006  . Cancer (HCC)    Phreesia 10/29/2020  . Carpal tunnel syndrome   . Chronic constipation   . Chronic pain syndrome    followed by [facility]---  back  . Clotting disorder (HCC)    Phreesia 10/29/2020  . Depression   . Dermatitis 12/15/2011  . Difficult intubation   . Educated about COVID-19 virus infection 05/14/2019  . Fall at home 01/26/2016  . Family history of adverse reaction to anesthesia    MOTHER--- PONV  . Fatigue 09/17/2012  . Fibromyalgia   . FIBROMYALGIA 03/16/2008   Qualifier: Diagnosis of  By: Bullins, Luann    . GERD (gastroesophageal reflux disease)   . Headache disorder  01/16/2013  . Headache syndrome 01/26/2016  . Hip pain, right 07/08/2019  . History of MRSA infection    lip abscess  . History of ovarian cyst 06/2011   s/p  BSO  . History of pulmonary embolus (PE) 1997   post EP with ablation pulmonary veouns for SVT/ Atrial Fib.  . History of supraventricular tachycardia    s/p  ablation 1996  and 1997  by dr Rodolfo Clan  . History of TIA (transient ischemic attack) 1997   post op EP ablation PE  . Hyperlipidemia   . Hyperlipidemia LDL goal <100 03/16/2008   Qualifier: Diagnosis of  By: Bullins, Luann    . Hypothyroidism    followed by pcp  . Incisional hernia   . Insomnia 12/15/2011  . Intermittent palpitations 08/22/2017  . Interstitial cystitis    09-13-2018   per pt last flare-up  May 2019 (followed by pcp)  . INTERSTITIAL CYSTITIS 02/17/2011   Qualifier: Diagnosis of  By: Rodolph Clap MD, Daivd Dub    . Iron deficiency anemia    09-13-2018  PER PT STABLE  . Irregular heart rate 07/25/2013  . Lipoma of back    upper  . Low back pain radiating to right leg 07/04/2019  . Low ferritin level 06/14/2014  . MDD (major depressive disorder), single episode, in full remission (HCC) 10/27/2017  . Medically noncompliant 02/28/2015   Multiple missed appointments, both follow-up appointments and lab appointments.   . Metabolic syndrome X 02/10/2011   Qualifier: Diagnosis of  By: Lodema Hong MD, Margaret  hBA1c is 5.8 in 02/2013   . Migraines   . Morbid obesity (HCC) 03/27/2013  . Muscle spasm 01/03/2020  . Nausea alone 07/17/2014  . NECK PAIN, CHRONIC 03/16/2008   Qualifier: Diagnosis of  By: Lind Guest    . Normal coronary arteries    a. by CT 12/2018.  . Obesity 03/16/2008   Qualifier: Diagnosis of  By: Lind Guest    . Oral ulceration 08/23/2014   Presented at 06/14/2014 visit   . Other malaise and fatigue 03/16/2008   Centricity Description: FATIGUE, CHRONIC Qualifier: Diagnosis of  By: Lind Guest   Centricity Description: FATIGUE  Qualifier: Diagnosis of  By: Arville Lime, Dawn    . OVARIAN CYST 12/26/2009   Qualifier: Diagnosis of  By: Lodema Hong MD, Claris Che    . Paget disease of breast, left (HCC)   . Paget's disease of breast, left (HCC) 03/16/2008   Qualifier: Diagnosis of  By: Lind Guest  Left diagnosed in 2006 F/h breast cancer x 15 family members  . Partial small bowel obstruction (HCC) 07/04/2012  . PONV (postoperative nausea and vomiting)    SEVERE  . Postsurgical menopause 11/13/2011  . Presence of IVC filter 06/06/2019  . PSVT (paroxysmal supraventricular tachycardia) (HCC)    HX ABLATION 1996 AND 1997  . Recurrent oral herpes simplex infection 10/13/2017  . ROM (right otitis media) 01/23/2013  . S/P insertion of IVC (inferior vena caval) filter 05/08/2005   greenfield (non-retrievable)  /  dx 2019  a leg of filter is protruding thru the vena cava in to right L2 vertebral body (09-13-2018  per pt having surgery to remove filter in [place])  . S/P radiofrequency ablation operation for arrhythmia 1996   1996  and 1997,   SVT and Atrial Fib  . Sinusitis, chronic 01/26/2016  . SMALL BOWEL OBSTRUCTION, HX OF 08/07/2008   Annotation: obstruction w/ adhesions led to partial colectomy Qualifier: Diagnosis of  By: Minna Merritts    . Stroke [facility])    Phreesia 10/29/2020, TIAs in the past  . Swelling of hand 09/17/2012  . Syncope   . Tachycardia 02/03/2010   Qualifier: Diagnosis of  By: Via LPN, Larita Fife    . Thyroid disease    Phreesia 10/29/2020  . TOS (thoracic outlet syndrome)   . Ulcer 12/15/2011  . Urinary frequency 07/18/2014  . Vaginitis and vulvovaginitis 05/10/2013  . Vitamin D deficiency 05/05/2015  . Wears glasses      Medicines Meds ordered this encounter  Medications  . metoCLOPramide (REGLAN) injection 10 mg  . lactated ringers bolus 1,000 mL  . iohexol (OMNIPAQUE) 300 MG/ML solution 100 mL  . HYDROmorphone (DILAUDID) injection 1 mg  . lactated ringers bolus 1,000 mL  . potassium  chloride (KLOR-CON) packet 40 mEq  . magnesium sulfate IVPB 2 g 50 mL  . metoCLOPramide (REGLAN) 10 MG tablet    Sig: Take 1 tablet (10 mg total) by mouth every 6 (six) hours.    Dispense:  30 tablet    Refill:  0  I have reviewed the patients home medicines and have made adjustments as needed  Problem List / ED Course: Problem List Items Addressed This Visit       Other   Dehydration   Other Visit Diagnoses       Nausea and vomiting, unspecified vomiting type    -  Primary            {Document critical care time when appropriate:1} {Document review of labs and clinical decision tools ie heart score, Chads2Vasc2 etc:1}  {Document your independent review of radiology images, and any outside records:1} {Document your discussion with family members, caretakers, and with consultants:1} {Document social determinants of health affecting pt's care:1} {Document your decision making why or why not admission, treatments were needed:1}  This note was created using dictation software, which may contain spelling or grammatical errors.

## 2024-04-17 ENCOUNTER — Encounter (INDEPENDENT_AMBULATORY_CARE_PROVIDER_SITE_OTHER): Payer: Self-pay | Admitting: Gastroenterology

## 2024-04-19 ENCOUNTER — Ambulatory Visit: Admitting: Student in an Organized Health Care Education/Training Program

## 2024-04-19 ENCOUNTER — Ambulatory Visit: Admitting: Urology

## 2024-04-24 ENCOUNTER — Ambulatory Visit (INDEPENDENT_AMBULATORY_CARE_PROVIDER_SITE_OTHER): Admitting: Gastroenterology

## 2024-05-05 ENCOUNTER — Ambulatory Visit (INDEPENDENT_AMBULATORY_CARE_PROVIDER_SITE_OTHER): Payer: Medicare Other | Admitting: Family Medicine

## 2024-05-05 ENCOUNTER — Encounter: Payer: Self-pay | Admitting: Family Medicine

## 2024-05-05 DIAGNOSIS — E538 Deficiency of other specified B group vitamins: Secondary | ICD-10-CM | POA: Diagnosis not present

## 2024-05-05 DIAGNOSIS — E66811 Obesity, class 1: Secondary | ICD-10-CM

## 2024-05-05 DIAGNOSIS — R4189 Other symptoms and signs involving cognitive functions and awareness: Secondary | ICD-10-CM

## 2024-05-05 DIAGNOSIS — R253 Fasciculation: Secondary | ICD-10-CM

## 2024-05-05 DIAGNOSIS — R1110 Vomiting, unspecified: Secondary | ICD-10-CM | POA: Diagnosis not present

## 2024-05-05 DIAGNOSIS — Z86711 Personal history of pulmonary embolism: Secondary | ICD-10-CM

## 2024-05-05 DIAGNOSIS — E039 Hypothyroidism, unspecified: Secondary | ICD-10-CM

## 2024-05-05 DIAGNOSIS — E782 Mixed hyperlipidemia: Secondary | ICD-10-CM

## 2024-05-05 DIAGNOSIS — K599 Functional intestinal disorder, unspecified: Secondary | ICD-10-CM

## 2024-05-05 DIAGNOSIS — N898 Other specified noninflammatory disorders of vagina: Secondary | ICD-10-CM

## 2024-05-05 DIAGNOSIS — D509 Iron deficiency anemia, unspecified: Secondary | ICD-10-CM

## 2024-05-05 DIAGNOSIS — F4322 Adjustment disorder with anxiety: Secondary | ICD-10-CM

## 2024-05-05 NOTE — Patient Instructions (Addendum)
 F/U in 5 months, call if you need me sooner  Labs today CBC, iron , ferritin, B12, cmp and eGFr, tSH, magnesium   For the 2 episodes reported in the past 4 weeks when you had jerking and unresponsiveness you will need neurology evaluation for this and I will refer you  Thanks for choosing [facility], we consider it a privelige to serve you.

## 2024-05-07 LAB — CBC WITH DIFFERENTIAL/PLATELET
Basophils Absolute: 0.1 10*3/uL (ref 0.0–0.2)
Basos: 1 %
EOS (ABSOLUTE): 0.1 10*3/uL (ref 0.0–0.4)
Eos: 2 %
Hematocrit: 42.8 % (ref 34.0–46.6)
Hemoglobin: 14.3 g/dL (ref 11.1–15.9)
Immature Grans (Abs): 0 10*3/uL (ref 0.0–0.1)
Immature Granulocytes: 1 %
Lymphocytes Absolute: 2.1 10*3/uL (ref 0.7–3.1)
Lymphs: 28 %
MCH: 30.8 pg (ref 26.6–33.0)
MCHC: 33.4 g/dL (ref 31.5–35.7)
MCV: 92 fL (ref 79–97)
Monocytes Absolute: 0.5 10*3/uL (ref 0.1–0.9)
Monocytes: 7 %
Neutrophils Absolute: 4.7 10*3/uL (ref 1.4–7.0)
Neutrophils: 61 %
Platelets: 211 10*3/uL (ref 150–450)
RBC: 4.65 x10E6/uL (ref 3.77–5.28)
RDW: 13.5 % (ref 11.7–15.4)
WBC: 7.5 10*3/uL (ref 3.4–10.8)

## 2024-05-07 LAB — VITAMIN B12: Vitamin B-12: 982 pg/mL (ref 232–1245)

## 2024-05-07 LAB — MAGNESIUM: Magnesium: 1.2 mg/dL — ABNORMAL LOW (ref 1.6–2.3)

## 2024-05-07 LAB — CMP14+EGFR
ALT: 18 IU/L (ref 0–32)
AST: 23 IU/L (ref 0–40)
Albumin: 4.4 g/dL (ref 3.9–4.9)
Alkaline Phosphatase: 93 IU/L (ref 44–121)
BUN/Creatinine Ratio: 18 (ref 12–28)
BUN: 22 mg/dL (ref 8–27)
Bilirubin Total: 0.3 mg/dL (ref 0.0–1.2)
Chloride: 107 mmol/L — ABNORMAL HIGH (ref 96–106)
Creatinine, Ser: 1.25 mg/dL — ABNORMAL HIGH (ref 0.57–1.00)
Globulin, Total: 1.8 g/dL (ref 1.5–4.5)
Glucose: 97 mg/dL (ref 70–99)
Potassium: 3.6 mmol/L (ref 3.5–5.2)
Sodium: 141 mmol/L (ref 134–144)
Total Protein: 6.2 g/dL (ref 6.0–8.5)
eGFR: 49 mL/min/{1.73_m2} — ABNORMAL LOW (ref >59)

## 2024-05-07 LAB — TSH: TSH: 0.34 u[IU]/mL — ABNORMAL LOW (ref 0.450–4.500)

## 2024-05-07 LAB — FERRITIN: Ferritin: 229 ng/mL — ABNORMAL HIGH (ref 15–150)

## 2024-05-07 LAB — IRON: Iron: 76 ug/dL (ref 27–139)

## 2024-05-08 ENCOUNTER — Ambulatory Visit: Admitting: Internal Medicine

## 2024-05-08 ENCOUNTER — Encounter: Payer: Self-pay | Admitting: Family Medicine

## 2024-05-08 DIAGNOSIS — R253 Fasciculation: Secondary | ICD-10-CM | POA: Insufficient documentation

## 2024-05-08 DIAGNOSIS — R4189 Other symptoms and signs involving cognitive functions and awareness: Secondary | ICD-10-CM | POA: Insufficient documentation

## 2024-05-08 MED ORDER — ALPRAZOLAM 1 MG PO TABS: 1.0000 mg | ORAL_TABLET | Freq: Two times a day (BID) | ORAL | 5 refills | Status: DC

## 2024-05-08 NOTE — Progress Notes (Signed)
 [name]     MRN: [id number]      DOB: 1961-03-29  Chief Complaint  Patient presents with   Medical Management of Chronic Issues    Follow up    Emesis    Complains gastroparesis flare up causing vomiting and and diarrhea on and off for 10 weeks. Also complains of dry mouth and dehydration    HPI [name] is here for follow up and re-evaluation of chronic medical conditions, medication management and review of any available recent lab and radiology data.  Preventive health is updated, specifically  Cancer screening and Immunization.   Has been having recurrent episodes of v/D for past 10 weeks, and feels as though she is dehydrated and her magnesium . And potassium levels are off Reports 2 eissoes in past 4 weeka as reported by her spouse lasting less than 1 miniute when she became unresponsive and had jerking, no report of incontinence of stool or urine at the time  ROS Denies recent fever or chills. Denies sinus pressure, nasal congestion, ear pain or sore throat. Denies chest congestion, productive cough or wheezing. Denies chest pains, palpitations and leg swelling .   DChronic  pain managed through pain clinic. Denies depression or uncontrolled  anxiety  Denies skin break down or rash.   PE  BP 101/70   Pulse 89   Resp 18   Ht 5' 4.5" (1.638 m)   Wt 183 lb 0.6 oz (83 kg)   SpO2 96%   BMI 30.93 kg/m   Patient alert and oriented and in no cardiopulmonary distress.  HEENT: No facial asymmetry, EOMI,     Neck supple .  Chest: Clear to auscultation bilaterally.  CVS: S1, S2 no murmurs, no S3.Regular rate.  Ext: No edema  MS: Decreased  ROM spine, , hips and knees.  Skin: Intact, no ulcerations or rash noted.  Psych: Good eye contact, normal affect. Memory intact not anxious or depressed appearing.  CNS: CN 2-12 intact, power,  normal throughout.no focal deficits noted.   Assessment & Plan  Hypothyroidism TSH slightly abn on lab , will rept  in end June/ early July as renal fuction anbn with this lab draw  Hypomagnesemia Magnesium  level low, pt to double dose x 1 week , thn resume prior dose and rept lab in 2 weeks  Intractable vomiting 10 week h/o v/d , lab shows AKI will rept non fasting in 2 weeks, improved hydration encouraged  Hyperlipidemia Hyperlipidemia:Low fat diet discussed and encouraged.   Lipid Panel  Lab Results  Component Value Date   CHOL 148 12/03/2023   HDL 46 12/03/2023   LDLCALC 72 12/03/2023   LDLDIRECT 138 (H) 09/15/2012   TRIG 178 (H) 12/03/2023   CHOLHDL 3.2 12/03/2023   Updated lab needed will add on to current lab    Adjustment disorder with anxiety Controlled, no change in medication   Abnormal small bowel motility 10 week h/o v/D and uncontrolled abdominal pain, managed by GI  History of pulmonary embolus (PE) Maintained on  lifelong anticoagullant  Iron  deficiency anemia Update lab and is followed by Hematology  Obesity (BMI 30.0-34.9)  Patient re-educated about  the importance of commitment to a  minimum of 150 minutes of exercise per week as able.  The importance of healthy food choices with portion control discussed, as well as eating regularly and within a 12 hour window most days. The need to choose "clean , green" food 50 to 75% of the time is discussed,  as well as to make water  the primary drink and set a goal of 64 ounces water  daily.       05/05/2024    1:06 PM 04/13/2024    2:49 PM 04/13/2024    1:47 PM  Weight /BMI  Weight 183 lb 0.6 oz 183 lb 184 lb 9.6 oz  Height 5' 4.5" (1.638 m) 5\' 4"  (1.626 m) 5\' 4"  (1.626 m)  BMI 30.93 kg/m2 31.41 kg/m2 31.69 kg/m2    Stable, marked improvement overall  Unresponsiveness No associated incontinence, duration reportedly less han 1 min, refer Neurology for E/M  Vaginal dryness Improved with double therapy, managed by Gyne  Jerking No incontinece, less than 1 min duration , refer Neeurology

## 2024-05-08 NOTE — Assessment & Plan Note (Signed)
 Update lab and is followed by Hematology

## 2024-05-08 NOTE — Assessment & Plan Note (Signed)
 No incontinece, less than 1 min duration , refer Neeurology

## 2024-05-08 NOTE — Assessment & Plan Note (Signed)
 10 week h/o v/D and uncontrolled abdominal pain, managed by GI

## 2024-05-08 NOTE — Assessment & Plan Note (Signed)
 Improved with double therapy, managed by Gyne

## 2024-05-08 NOTE — Assessment & Plan Note (Signed)
  Patient re-educated about  the importance of commitment to a  minimum of 150 minutes of exercise per week as able.  The importance of healthy food choices with portion control discussed, as well as eating regularly and within a 12 hour window most days. The need to choose "clean , green" food 50 to 75% of the time is discussed, as well as to make water  the primary drink and set a goal of 64 ounces water  daily.       05/05/2024    1:06 PM 04/13/2024    2:49 PM 04/13/2024    1:47 PM  Weight /BMI  Weight 183 lb 0.6 oz 183 lb 184 lb 9.6 oz  Height 5' 4.5" (1.638 m) 5\' 4"  (1.626 m) 5\' 4"  (1.626 m)  BMI 30.93 kg/m2 31.41 kg/m2 31.69 kg/m2    Stable, marked improvement overall

## 2024-05-08 NOTE — Assessment & Plan Note (Signed)
 No associated incontinence, duration reportedly less han 1 min, refer Neurology for E/M

## 2024-05-08 NOTE — Assessment & Plan Note (Signed)
 Maintained on  lifelong anticoagullant

## 2024-05-08 NOTE — Assessment & Plan Note (Signed)
 10 week h/o v/d , lab shows AKI will rept non fasting in 2 weeks, improved hydration encouraged

## 2024-05-08 NOTE — Assessment & Plan Note (Signed)
 TSH slightly abn on lab , will rept in end June/ early July as renal fuction anbn with this lab draw

## 2024-05-08 NOTE — Assessment & Plan Note (Signed)
 Hyperlipidemia:Low fat diet discussed and encouraged.   Lipid Panel  Lab Results  Component Value Date   CHOL 148 12/03/2023   HDL 46 12/03/2023   LDLCALC 72 12/03/2023   LDLDIRECT 138 (H) 09/15/2012   TRIG 178 (H) 12/03/2023   CHOLHDL 3.2 12/03/2023   Updated lab needed will add on to current lab

## 2024-05-08 NOTE — Assessment & Plan Note (Signed)
 Controlled, no change in medication

## 2024-05-08 NOTE — Assessment & Plan Note (Addendum)
 Magnesium  level low, pt to double dose x 1 week , thn resume prior dose and rept lab in 2 weeks

## 2024-05-09 ENCOUNTER — Ambulatory Visit: Payer: Self-pay | Admitting: Family Medicine

## 2024-05-09 LAB — SPECIMEN STATUS REPORT

## 2024-05-12 ENCOUNTER — Ambulatory Visit: Admitting: Neurology

## 2024-05-15 ENCOUNTER — Encounter: Payer: Self-pay | Admitting: Student in an Organized Health Care Education/Training Program

## 2024-05-15 ENCOUNTER — Ambulatory Visit
Admission: RE | Admit: 2024-05-15 | Discharge: 2024-05-15 | Disposition: A | Discharge status: Home / Self Care | Source: Ambulatory Visit | Attending: Student in an Organized Health Care Education/Training Program | Admitting: Student in an Organized Health Care Education/Training Program

## 2024-05-15 ENCOUNTER — Ambulatory Visit
Attending: Student in an Organized Health Care Education/Training Program | Admitting: Student in an Organized Health Care Education/Training Program

## 2024-05-15 VITALS — BP 95/58 | HR 80 | Temp 97.3°F | Resp 14 | Ht 64.5 in | Wt 183.0 lb

## 2024-05-15 DIAGNOSIS — M5412 Radiculopathy, cervical region: Secondary | ICD-10-CM | POA: Diagnosis present

## 2024-05-15 DIAGNOSIS — M542 Cervicalgia: Secondary | ICD-10-CM | POA: Diagnosis present

## 2024-05-15 MED ORDER — DEXAMETHASONE SODIUM PHOSPHATE 10 MG/ML IJ SOLN
INTRAMUSCULAR | Status: AC
  Filled 2024-05-15: qty 1

## 2024-05-15 MED ORDER — DIAZEPAM 5 MG PO TABS
5.0000 mg | ORAL_TABLET | ORAL | Status: AC
  Administered 2024-05-15: 5 mg via ORAL

## 2024-05-15 MED ORDER — ROPIVACAINE HCL 2 MG/ML IJ SOLN
INTRAMUSCULAR | Status: AC
  Filled 2024-05-15: qty 20

## 2024-05-15 MED ORDER — DIAZEPAM 5 MG PO TABS
ORAL_TABLET | ORAL | Status: AC
  Filled 2024-05-15: qty 1

## 2024-05-15 MED ORDER — ROPIVACAINE HCL 2 MG/ML IJ SOLN
1.0000 mL | Freq: Once | INTRAMUSCULAR | Status: AC
  Administered 2024-05-15: 1 mL via EPIDURAL

## 2024-05-15 MED ORDER — IOHEXOL 180 MG/ML  SOLN
INTRAMUSCULAR | Status: AC
  Filled 2024-05-15: qty 20

## 2024-05-15 MED ORDER — SODIUM CHLORIDE 0.9% FLUSH
1.0000 mL | Freq: Once | INTRAVENOUS | Status: AC
  Administered 2024-05-15: 1 mL

## 2024-05-15 MED ORDER — IOHEXOL 180 MG/ML  SOLN
10.0000 mL | Freq: Once | INTRAMUSCULAR | Status: AC
  Administered 2024-05-15: 10 mL via EPIDURAL

## 2024-05-15 MED ORDER — SODIUM CHLORIDE (PF) 0.9 % IJ SOLN
INTRAMUSCULAR | Status: AC
  Filled 2024-05-15: qty 10

## 2024-05-15 MED ORDER — LIDOCAINE HCL 2 % IJ SOLN
20.0000 mL | Freq: Once | INTRAMUSCULAR | Status: AC
  Administered 2024-05-15: 400 mg

## 2024-05-15 MED ORDER — LIDOCAINE HCL 2 % IJ SOLN
INTRAMUSCULAR | Status: AC
  Filled 2024-05-15: qty 20

## 2024-05-15 MED ORDER — DEXAMETHASONE SODIUM PHOSPHATE 10 MG/ML IJ SOLN
10.0000 mg | Freq: Once | INTRAMUSCULAR | Status: AC
  Administered 2024-05-15: 10 mg

## 2024-05-15 NOTE — Patient Instructions (Signed)

## 2024-05-15 NOTE — Progress Notes (Signed)
 PROVIDER NOTE: Interpretation of information contained herein should be left to medically-trained personnel. Specific patient instructions are provided elsewhere under "Patient Instructions" section of medical record. This document was created in part using STT-dictation technology, any transcriptional errors that may result from this process are unintentional.  Patient: [name] Type: Established DOB: [birth year] MRN: [id number] PCP: Towanda Fret, MD  Service: Procedure DOS: 05/15/2024 Setting: Ambulatory Location: Ambulatory outpatient facility Delivery: Face-to-face Provider: Cephus Collin, MD Specialty: Interventional Pain Management Specialty designation: 09 Location: Outpatient facility Ref. Prov.: Towanda Fret, MD       Interventional Therapy   Procedure: Cervical Epidural Steroid injection (CESI) (Interlaminar) #1  Laterality: Right  Level: C7-T1 Imaging: Fluoroscopy-assisted DOS: 05/15/2024  Performed by: Cephus Collin, MD Anesthesia: Local anesthesia (1-2% Lidocaine ) Sedation: Minimal Sedation                         Purpose: Diagnostic/Therapeutic Indications: Cervicalgia, cervical radicular pain, degenerative disc disease, severe enough to impact quality of life or function. 1. Cervical radicular pain   2. Cervicalgia    NAS-11 score:   Pre-procedure: 7 /10   Post-procedure: 7 /10      Position  Prep  Materials:  Location setting: Procedure suite Position: Prone, on modified reverse trendelenburg to facilitate breathing, with head in head-cradle. Pillows positioned under chest (below chin-level) with cervical spine flexed. Safety Precautions: Patient was assessed for positional comfort and pressure points before starting the procedure. Prepping solution: DuraPrep (Iodine  Povacrylex [0.7% available iodine ] and Isopropyl Alcohol, 74% w/w) Prep Area: Entire  cervicothoracic region Approach: percutaneous, paramedial Intended target: Posterior  cervical epidural space Materials Procedure:  Tray: Epidural Needle(s): Epidural (Tuohy) Qty: 1 Length: (90mm) 3.5-inch Gauge: 22G  H&P (Pre-op Assessment):  [name] is a 63 y.o. (year old), female patient, seen today for interventional treatment. She  has a past surgical history that includes Vena cava filter placement (05/08/2005   @[facility] ); Carpal tunnel release (Right, ?); Cesarean section (1986); Colon surgery; Abdominal hysterectomy (1987); Bilateral salpingoophorectomy (07/24/2011); Cardiac electrophysiology study and ablation (1996  and 1997); Anterior cervical decomp/discectomy fusion (03-07-2002   dr elsner  @[facility] ); Appendectomy (1980); Eenterocutaneous fistula closure (multiple); Mastectomy (Left, 2006); Breast implant removal (Bilateral); Breast enhancement surgery (Bilateral, 1993); CYSTO/  HYDRODISTENTION/  INSTILATION THERAPY (MULTIPLE); EXPLORATORY LAPAROTOMY INCISIONAL VENTRAL HERNIA REPAIR / RESECTION SMALL BOWEL (11-09-2014   @[facility] ); Cardiovascular stress test (11-18-2015   dr Rodolfo Clan); transthoracic echocardiogram (02/04/2016); WIDE EXCISION PERIRECTAL ABSCESSES (09-22-2005   @ [facility]); Cardiac catheterization (07-06-2003   dr Therese Flash brodie); Total colectomy (08-04-2002    @[facility] ); Lipoma excision (Right, 09/16/2018); Breast biopsy (Right, 2019); Augmentation mammaplasty (Right, 2006); Breast surgery (N/A); Cholecystectomy (N/A); Cesarean section (N/A); Fracture surgery (N/A); Joint replacement (N/A); Small intestine surgery (N/A); Spine surgery (N/A); Tubal ligation (N/A); Colonoscopy with propofol  (N/A, 05/02/2021); Esophagogastroduodenoscopy (egd) with propofol  (N/A, 05/02/2021); Savory dilation (05/02/2021); polypectomy (05/02/2021); biopsy (05/02/2021); Esophagogastroduodenoscopy (egd) with propofol  (N/A, 12/04/2021); biopsy (12/04/2021); Esophagogastroduodenoscopy (egd) with propofol  (N/A, 03/31/2022); Botox  injection (N/A, 03/31/2022); biopsy (03/31/2022); polypectomy (03/31/2022);  UPPER EXTREMITY VENOGRAPHY (N/A, 09/13/2023); PERIPHERAL VASCULAR BALLOON ANGIOPLASTY (Right, 09/13/2023); Hernia repair (2012-2015); and Cosmetic surgery (2006). [name] has a current medication list which includes the following prescription(s): alprazolam , aspirin , butalbital -acetaminophen -caffeine , premarin , cyanocobalamin , cyclobenzaprine , cyclosporine , duloxetine , ezetimibe , fenofibrate , furosemide , hydromorphone , intrarosa, lactobacillus, levothyroxine , linaclotide , magnesium  oxide, megared omega-3 krill oil, melatonin, metformin , metoclopramide , multiple vitamins-minerals, naloxone, omeprazole , ondansetron , OVER THE COUNTER MEDICATION, systane ultra, potassium chloride , pravastatin , promethegan, vitamin d  (ergocalciferol ), xarelto , botulinum toxin  type a, and [DISCONTINUED] alprazolam , and the following Facility-Administered Medications: iohexol . Her primarily concern today is the No chief complaint on file.  Initial Vital Signs:  Pulse/HCG Rate: 80ECG Heart Rate: 85 Temp: (!) 97.3 F (36.3 C) Resp: 16 BP: 96/69 SpO2: 100 %  BMI: Estimated body mass index is 30.93 kg/m as calculated from the following:   Height as of this encounter: 5' 4.5" (1.638 m).   Weight as of this encounter: 183 lb (83 kg).  Risk Assessment: Allergies: Reviewed. She is allergic to nitrofurantoin, crestor  [rosuvastatin  calcium ], bisacodyl , codeine, iron  sucrose, monosodium glutamate, scopolamine hbr, morphine , and other.  Allergy Precautions: None required Coagulopathies: Reviewed. None identified.  Blood-thinner therapy: None at this time Active Infection(s): Reviewed. None identified. [name] is afebrile  Site Confirmation: [name] was asked to confirm the procedure and laterality before marking the site Procedure checklist: Completed Consent: Before the procedure and under the influence of no sedative(s), amnesic(s), or anxiolytics, the patient was informed of the treatment options, risks and  possible complications. To fulfill our ethical and legal obligations, as recommended by the American Medical Association's Code of Ethics, I have informed the patient of my clinical impression; the nature and purpose of the treatment or procedure; the risks, benefits, and possible complications of the intervention; the alternatives, including doing nothing; the risk(s) and benefit(s) of the alternative treatment(s) or procedure(s); and the risk(s) and benefit(s) of doing nothing. The patient was provided information about the general risks and possible complications associated with the procedure. These may include, but are not limited to: failure to achieve desired goals, infection, bleeding, organ or nerve damage, allergic reactions, paralysis, and death. In addition, the patient was informed of those risks and complications associated to Spine-related procedures, such as failure to decrease pain; infection (i.e.: Meningitis, epidural or intraspinal abscess); bleeding (i.e.: epidural hematoma, subarachnoid hemorrhage, or any other type of intraspinal or peri-dural bleeding); organ or nerve damage (i.e.: Any type of peripheral nerve, nerve root, or spinal cord injury) with subsequent damage to sensory, motor, and/or autonomic systems, resulting in permanent pain, numbness, and/or weakness of one or several areas of the body; allergic reactions; (i.e.: anaphylactic reaction); and/or death. Furthermore, the patient was informed of those risks and complications associated with the medications. These include, but are not limited to: allergic reactions (i.e.: anaphylactic or anaphylactoid reaction(s)); adrenal axis suppression; blood sugar elevation that in diabetics may result in ketoacidosis or comma; water  retention that in patients with history of congestive heart failure may result in shortness of breath, pulmonary edema, and decompensation with resultant heart failure; weight gain; swelling or edema;  medication-induced neural toxicity; particulate matter embolism and blood vessel occlusion with resultant organ, and/or nervous system infarction; and/or aseptic necrosis of one or more joints. Finally, the patient was informed that Medicine is not an exact science; therefore, there is also the possibility of unforeseen or unpredictable risks and/or possible complications that may result in a catastrophic outcome. The patient indicated having understood very clearly. We have given the patient no guarantees and we have made no promises. Enough time was given to the patient to ask questions, all of which were answered to the patient's satisfaction. Ms. Ciolino has indicated that she wanted to continue with the procedure. Attestation: I, the ordering provider, attest that I have discussed with the patient the benefits, risks, side-effects, alternatives, likelihood of achieving goals, and potential problems during recovery for the procedure that I have provided informed consent. Date  Time: 05/15/2024 12:36 PM  Pre-Procedure Preparation:  Monitoring: As per clinic protocol. Respiration, ETCO2, SpO2, BP, heart rate and rhythm monitor placed and checked for adequate function Safety Precautions: Patient was assessed for positional comfort and pressure points before starting the procedure. Time-out: I initiated and conducted the "Time-out" before starting the procedure, as per protocol. The patient was asked to participate by confirming the accuracy of the "Time Out" information. Verification of the correct person, site, and procedure were performed and confirmed by me, the nursing staff, and the patient. "Time-out" conducted as per Joint Commission's Universal Protocol (UP.01.01.01). Time: 1317 Start Time: 1317 hrs.  Description  Narrative of Procedure:          Rationale (medical necessity): procedure needed and proper for the diagnosis and/or treatment of the patient's medical symptoms and needs. Start  Time: 1317 hrs. Safety Precautions: Aspiration looking for blood return was conducted prior to all injections. At no point did we inject any substances, as a needle was being advanced. No attempts were made at seeking any paresthesias. Safe injection practices and needle disposal techniques used. Medications properly checked for expiration dates. SDV (single dose vial) medications used. Description of procedure: Protocol guidelines were followed. The patient was assisted into a comfortable position. The target area was identified and the area prepped in the usual manner. Skin & deeper tissues infiltrated with local anesthetic. Appropriate amount of time allowed to pass for local anesthetics to take effect. Using fluoroscopic guidance, the epidural needle was introduced through the skin, ipsilateral to the reported pain, and advanced to the target area. Posterior laminar os was contacted and the needle walked caudad, until the lamina was cleared. The ligamentum flavum was engaged and the epidural space identified using "loss-of-resistance technique" with 2-3 ml of PF-NaCl (0.9% NSS), in a 5cc dedicated LOR syringe. (See "Imaging guidance" below for use of contrast details.) Once proper needle placement was secured, and negative aspiration confirmed, the solution was injected in intermittent fashion, asking for systemic symptoms every 0.5cc. The needles were then removed and the area cleansed, making sure to leave some of the prepping solution back to take advantage of its long term bactericidal properties.  Vitals:   05/15/24 1310 05/15/24 1315 05/15/24 1320 05/15/24 1321  BP: 92/62 (!) 98/59 102/61 (!) 95/58  Pulse:      Resp: 15 13 19 14   Temp:      TempSrc:      SpO2: 100% 100% 99% 100%  Weight:      Height:         End Time: 1321 hrs.  Imaging Guidance (Spinal):          Type of Imaging Technique: Fluoroscopy Guidance (Spinal) Indication(s): Fluoroscopy guidance for needle placement to enhance  accuracy in procedures requiring precise needle localization for targeted delivery of medication in or near specific anatomical locations not easily accessible without such real-time imaging assistance. Exposure Time: Please see nurses notes. Contrast: Before injecting any contrast, we confirmed that the patient did not have an allergy to iodine , shellfish, or radiological contrast. Once satisfactory needle placement was completed at the desired level, radiological contrast was injected. Contrast injected under live fluoroscopy. No contrast complications. See chart for type and volume of contrast used. Fluoroscopic Guidance: I was personally present during the use of fluoroscopy. "Tunnel Vision Technique" used to obtain the best possible view of the target area. Parallax error corrected before commencing the procedure. "Direction-depth-direction" technique used to introduce the needle under continuous pulsed fluoroscopy. Once target was reached, antero-posterior, oblique,  and lateral fluoroscopic projection used confirm needle placement in all planes. Images permanently stored in EMR. Interpretation: I personally interpreted the imaging intraoperatively. Adequate needle placement confirmed in multiple planes. Appropriate spread of contrast into desired area was observed. No evidence of afferent or efferent intravascular uptake. No intrathecal or subarachnoid spread observed. Permanent images saved into the patient's record.  Post-operative Assessment:  Post-procedure Vital Signs:  Pulse/HCG Rate: 8082 Temp: (!) 97.3 F (36.3 C) Resp: 14 BP: (!) 95/58 SpO2: 100 %  EBL: None  Complications: No immediate post-treatment complications observed by team, or reported by patient.  Note: The patient tolerated the entire procedure well. A repeat set of vitals were taken after the procedure and the patient was kept under observation following institutional policy, for this type of procedure. Post-procedural  neurological assessment was performed, showing return to baseline, prior to discharge. The patient was provided with post-procedure discharge instructions, including a section on how to identify potential problems. Should any problems arise concerning this procedure, the patient was given instructions to immediately contact us , at any time, without hesitation. In any case, we plan to contact the patient by telephone for a follow-up status report regarding this interventional procedure.  Comments:  No additional relevant information.  Plan of Care (POC)  Orders:  Orders Placed This Encounter  Procedures   DG PAIN CLINIC C-ARM 1-60 MIN NO REPORT    Intraoperative interpretation by procedural physician at [facility].    Standing Status:   Standing    Number of Occurrences:   1    Reason for exam::   Assistance in needle guidance and placement for procedures requiring needle placement in or near specific anatomical locations not easily accessible without such assistance.     Medications ordered for procedure: Meds ordered this encounter  Medications   lidocaine  (XYLOCAINE ) 2 % (with pres) injection 400 mg   diazepam  (VALIUM ) tablet 5 mg    Make sure Flumazenil is available in the pyxis when using this medication. If oversedation occurs, administer 0.2 mg IV over 15 sec. If after 45 sec no response, administer 0.2 mg again over 1 min; may repeat at 1 min intervals; not to exceed 4 doses (1 mg)   ropivacaine  (PF) 2 mg/mL (0.2%) (NAROPIN ) injection 1 mL   sodium chloride  flush (NS) 0.9 % injection 1 mL   dexamethasone  (DECADRON ) injection 10 mg   iohexol  (OMNIPAQUE ) 180 MG/ML injection 10 mL    Must be Myelogram-compatible. If not available, you may substitute with a water -soluble, non-ionic, hypoallergenic, myelogram-compatible radiological contrast medium.   Medications administered: We administered lidocaine , diazepam , ropivacaine  (PF) 2 mg/mL (0.2%), sodium chloride  flush, and  dexamethasone .  See the medical record for exact dosing, route, and time of administration.  Follow-up plan:   Return in about 4 weeks (around 06/12/2024) for PPE, F2F.       Right interscalene block 03/15/2024, Right C-ESI 05/15/24     Recent Visits Date Type Provider Dept  03/15/24 Procedure visit Cephus Collin, MD [facility]  Showing recent visits within past 90 days and meeting all other requirements Today's Visits Date Type Provider Dept  05/15/24 Procedure visit Cephus Collin, MD [facility]  Showing today's visits and meeting all other requirements Future Appointments No visits were found meeting these conditions. Showing future appointments within next 90 days and meeting all other requirements  Disposition: Discharge home  Discharge (Date  Time): 05/15/2024; 1326 hrs.   Primary Care Physician: Towanda Fret, MD Location:  [facility] Note by: Gloyd Happ, MD (TTS technology used. I apologize for any typographical errors that were not detected and corrected.) Date: 05/15/2024; Time: 1:27 PM  Disclaimer:  Medicine is not an Visual merchandiser. The only guarantee in medicine is that nothing is guaranteed. It is important to note that the decision to proceed with this intervention was based on the information collected from the patient. The Data and conclusions were drawn from the patient's questionnaire, the interview, and the physical examination. Because the information was provided in large part by the patient, it cannot be guaranteed that it has not been purposely or unconsciously manipulated. Every effort has been made to obtain as much relevant data as possible for this evaluation. It is important to note that the conclusions that lead to this procedure are derived in large part from the available data. Always take into account that the treatment will also be dependent on availability of resources and existing treatment  guidelines, considered by other Pain Management Practitioners as being common knowledge and practice, at the time of the intervention. For Medico-Legal purposes, it is also important to point out that variation in procedural techniques and pharmacological choices are the acceptable norm. The indications, contraindications, technique, and results of the above procedure should only be interpreted and judged by a Board-Certified Interventional Pain Specialist with extensive familiarity and expertise in the same exact procedure and technique.

## 2024-05-15 NOTE — Progress Notes (Signed)
 Safety precautions to be maintained throughout the outpatient stay will include: orient to surroundings, keep bed in low position, maintain call bell within reach at all times, provide assistance with transfer out of bed and ambulation.

## 2024-05-16 ENCOUNTER — Telehealth: Payer: Self-pay | Admitting: *Deleted

## 2024-05-16 NOTE — Telephone Encounter (Signed)
Post procedure call; reports that she is doing well.   

## 2024-05-18 ENCOUNTER — Ambulatory Visit (HOSPITAL_COMMUNITY): Admission: RE | Admit: 2024-05-18 | Payer: Medicare Other | Source: Home / Self Care | Admitting: Urology

## 2024-05-18 ENCOUNTER — Encounter (HOSPITAL_COMMUNITY): Admission: RE | Payer: Self-pay | Source: Home / Self Care

## 2024-05-18 ENCOUNTER — Ambulatory Visit: Admitting: Urology

## 2024-05-18 SURGERY — CYSTOSCOPY, WITH URETHRAL DILATION
Anesthesia: General

## 2024-05-23 ENCOUNTER — Encounter: Payer: Self-pay | Admitting: Family Medicine

## 2024-05-23 ENCOUNTER — Ambulatory Visit: Payer: Self-pay | Admitting: Family Medicine

## 2024-05-23 ENCOUNTER — Inpatient Hospital Stay: Attending: Hematology | Admitting: Physician Assistant

## 2024-05-23 DIAGNOSIS — E538 Deficiency of other specified B group vitamins: Secondary | ICD-10-CM | POA: Diagnosis not present

## 2024-05-23 DIAGNOSIS — D509 Iron deficiency anemia, unspecified: Secondary | ICD-10-CM | POA: Insufficient documentation

## 2024-05-23 DIAGNOSIS — K90829 Short bowel syndrome, unspecified: Secondary | ICD-10-CM | POA: Insufficient documentation

## 2024-05-23 DIAGNOSIS — Z79899 Other long term (current) drug therapy: Secondary | ICD-10-CM | POA: Insufficient documentation

## 2024-05-23 DIAGNOSIS — E559 Vitamin D deficiency, unspecified: Secondary | ICD-10-CM | POA: Insufficient documentation

## 2024-05-23 DIAGNOSIS — R7989 Other specified abnormal findings of blood chemistry: Secondary | ICD-10-CM

## 2024-05-23 LAB — CBC WITH DIFFERENTIAL/PLATELET
Abs Immature Granulocytes: 0.05 10*3/uL (ref 0.00–0.07)
Basophils Absolute: 0 10*3/uL (ref 0.0–0.1)
Basophils Relative: 0 %
Eosinophils Absolute: 0.2 10*3/uL (ref 0.0–0.5)
Eosinophils Relative: 2 %
HCT: 39.7 % (ref 36.0–46.0)
Hemoglobin: 13.2 g/dL (ref 12.0–15.0)
Immature Granulocytes: 1 %
Lymphocytes Relative: 18 %
Lymphs Abs: 1.6 10*3/uL (ref 0.7–4.0)
MCH: 31.4 pg (ref 26.0–34.0)
MCHC: 33.2 g/dL (ref 30.0–36.0)
MCV: 94.5 fL (ref 80.0–100.0)
Monocytes Absolute: 0.5 10*3/uL (ref 0.1–1.0)
Monocytes Relative: 6 %
Neutro Abs: 6.5 10*3/uL (ref 1.7–7.7)
Neutrophils Relative %: 73 %
Platelets: 182 10*3/uL (ref 150–400)
RBC: 4.2 MIL/uL (ref 3.87–5.11)
RDW: 13.8 % (ref 11.5–15.5)
WBC: 8.9 10*3/uL (ref 4.0–10.5)
nRBC: 0 % (ref 0.0–0.2)

## 2024-05-23 LAB — COMPREHENSIVE METABOLIC PANEL WITH GFR
ALT: 15 U/L (ref 0–44)
AST: 24 U/L (ref 15–41)
Albumin: 3.6 g/dL (ref 3.5–5.0)
Alkaline Phosphatase: 51 U/L (ref 38–126)
Anion gap: 10 (ref 5–15)
BUN: 12 mg/dL (ref 8–23)
CO2: 23 mmol/L (ref 22–32)
Calcium: 8.8 mg/dL — ABNORMAL LOW (ref 8.9–10.3)
Chloride: 105 mmol/L (ref 98–111)
Creatinine, Ser: 0.86 mg/dL (ref 0.44–1.00)
GFR, Estimated: >60 mL/min (ref >60)
Glucose, Bld: 100 mg/dL — ABNORMAL HIGH (ref 70–99)
Potassium: 3.8 mmol/L (ref 3.5–5.1)
Sodium: 138 mmol/L (ref 135–145)
Total Bilirubin: 0.5 mg/dL (ref 0.0–1.2)
Total Protein: 6.3 g/dL — ABNORMAL LOW (ref 6.5–8.1)

## 2024-05-23 LAB — IRON AND TIBC
Iron: 75 ug/dL (ref 28–170)
Saturation Ratios: 20 % (ref 10.4–31.8)
TIBC: 379 ug/dL (ref 250–450)
UIBC: 304 ug/dL

## 2024-05-23 LAB — VITAMIN D 25 HYDROXY (VIT D DEFICIENCY, FRACTURES): Vit D, 25-Hydroxy: 50.6 ng/mL (ref 30–100)

## 2024-05-23 LAB — MAGNESIUM: Magnesium: 1.2 mg/dL — ABNORMAL LOW (ref 1.7–2.4)

## 2024-05-23 LAB — VITAMIN B12: Vitamin B-12: 539 pg/mL (ref 180–914)

## 2024-05-23 LAB — FERRITIN: Ferritin: 102 ng/mL (ref 11–307)

## 2024-05-23 MED ORDER — ACYCLOVIR 5 % EX OINT: TOPICAL_OINTMENT | CUTANEOUS | 1 refills | Status: AC

## 2024-05-24 ENCOUNTER — Other Ambulatory Visit (HOSPITAL_COMMUNITY): Payer: Self-pay

## 2024-05-24 ENCOUNTER — Encounter: Payer: Self-pay | Admitting: Hematology

## 2024-05-24 ENCOUNTER — Telehealth: Payer: Self-pay

## 2024-05-24 ENCOUNTER — Telehealth: Payer: Self-pay | Admitting: Pharmacy Technician

## 2024-05-24 NOTE — Telephone Encounter (Signed)
 Pharmacy Patient Advocate Encounter   Received notification from Onbase that prior authorization for Acyclovir  5% ointment is required/requested.   Insurance verification completed.   The patient is insured through [facility] .   Per test claim: PA required; PA submitted to above mentioned insurance via CoverMyMeds Key/confirmation #/EOC [id number] Status is pending

## 2024-05-24 NOTE — Progress Notes (Signed)
 [name]

## 2024-05-24 NOTE — Telephone Encounter (Signed)
 Copied from CRM [id number]. Topic: Clinical - Prescription Issue >> May 24, 2024 11:47 AM Baldomero Bone wrote: Reason for CRM: [name] with [facility] states that acyclovir  ointment (ZOVIRAX ) 5 % has been denied for coverage. Sending detailed fax with appeal information. Patient has been made aware of denial. Callback number is [phone] option 5

## 2024-05-25 LAB — METHYLMALONIC ACID, SERUM: Methylmalonic Acid, Quantitative: 135 nmol/L (ref 0–378)

## 2024-05-29 NOTE — Telephone Encounter (Signed)
 Pharmacy Patient Advocate Encounter  Received notification from [facility] that Prior Authorization for Acyclovir  5% ointment has been DENIED.  No reason given; No denial letter received via Fax or CMM. It has been requested and will be uploaded to the media tab once received.   PA #/Case ID/Reference #: [id number]
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
## 2024-05-30 ENCOUNTER — Encounter: Payer: Self-pay | Admitting: Family Medicine

## 2024-05-30 ENCOUNTER — Inpatient Hospital Stay: Attending: Hematology | Admitting: Physician Assistant

## 2024-05-30 DIAGNOSIS — Z86711 Personal history of pulmonary embolism: Secondary | ICD-10-CM

## 2024-05-30 DIAGNOSIS — D509 Iron deficiency anemia, unspecified: Secondary | ICD-10-CM

## 2024-05-30 DIAGNOSIS — K90829 Short bowel syndrome, unspecified: Secondary | ICD-10-CM | POA: Diagnosis not present

## 2024-05-30 DIAGNOSIS — E559 Vitamin D deficiency, unspecified: Secondary | ICD-10-CM

## 2024-05-30 DIAGNOSIS — E538 Deficiency of other specified B group vitamins: Secondary | ICD-10-CM | POA: Diagnosis not present

## 2024-05-30 NOTE — Addendum Note (Signed)
 Addended by: Sheril Dines on: 05/30/2024 04:36 PM   Modules accepted: Orders

## 2024-05-31 ENCOUNTER — Other Ambulatory Visit: Payer: Self-pay | Admitting: Family Medicine

## 2024-05-31 ENCOUNTER — Ambulatory Visit: Admitting: Urology

## 2024-06-02 ENCOUNTER — Telehealth: Payer: Self-pay

## 2024-06-02 NOTE — Telephone Encounter (Signed)
 Copied from CRM [id number]. Topic: Clinical - Medical Advice >> Jun 02, 2024  9:23 AM Albertha Alosa wrote: Reason for CRM: [facility] and stated that the infusion center at [facility] , has an infusion center , said patient could go there and get her magnesium  and IV Fluids if they can get a referral sent over so she can stay out of the ER

## 2024-06-07 ENCOUNTER — Encounter: Payer: Self-pay | Admitting: Family Medicine

## 2024-06-07 NOTE — Telephone Encounter (Signed)
 I just spoke with the pharmacist at the infusion center First dose will be 2 gm magnesium  IV given over 4 hours hopefully start 06/09/2024 Plan is to repeat Mg level 06/12/2024 and additional; therapy will be determined at that time Pt aware that the process is being styarted via pt e mail

## 2024-06-08 ENCOUNTER — Other Ambulatory Visit: Payer: Self-pay

## 2024-06-08 ENCOUNTER — Encounter: Payer: Self-pay | Admitting: Family Medicine

## 2024-06-08 DIAGNOSIS — R790 Abnormal level of blood mineral: Secondary | ICD-10-CM | POA: Insufficient documentation

## 2024-06-09 ENCOUNTER — Ambulatory Visit: Admitting: Neurology

## 2024-06-12 ENCOUNTER — Ambulatory Visit

## 2024-06-12 ENCOUNTER — Other Ambulatory Visit: Payer: Self-pay | Admitting: Family Medicine

## 2024-06-12 MED ORDER — MAGNESIUM SULFATE 2 GM/50ML IV SOLN
2.0000 g | Freq: Once | INTRAVENOUS | Status: AC
  Filled 2024-06-12: qty 50

## 2024-06-15 ENCOUNTER — Ambulatory Visit: Admitting: Nurse Practitioner

## 2024-06-22 ENCOUNTER — Encounter: Attending: Family Medicine | Admitting: Emergency Medicine

## 2024-06-22 DIAGNOSIS — R790 Abnormal level of blood mineral: Secondary | ICD-10-CM

## 2024-06-22 MED ORDER — MAGNESIUM SULFATE 2 GM/50ML IV SOLN
2.0000 g | Freq: Once | INTRAVENOUS | Status: AC
  Administered 2024-06-22: 2 g via INTRAVENOUS
  Filled 2024-06-22: qty 50

## 2024-06-22 NOTE — Progress Notes (Signed)
 Diagnosis:  Hypomagnesemia    Provider:  Antonetta Quant MD  Procedure: IV Infusion  IV Type: Peripheral, IV Location: R Hand  Magnesium , Dose: 2g  Infusion Start Time: 1322  Infusion Stop Time: 1522  Post Infusion IV Care: Peripheral IV Discontinued  Discharge: Condition: Good, Destination: Home . AVS Declined  Performed by:  Delon ONEIDA Officer, RN

## 2024-06-23 ENCOUNTER — Ambulatory Visit: Admitting: Neurology

## 2024-06-23 DIAGNOSIS — G43E09 Chronic migraine with aura, not intractable, without status migrainosus: Secondary | ICD-10-CM | POA: Diagnosis not present

## 2024-06-23 MED ORDER — ONABOTULINUMTOXINA 100 UNITS IJ SOLR
200.0000 [IU] | Freq: Once | INTRAMUSCULAR | Status: AC
  Administered 2024-06-23: 155 [IU] via INTRAMUSCULAR

## 2024-06-23 MED ORDER — NURTEC 75 MG PO TBDP: ORAL_TABLET | ORAL | Status: AC

## 2024-06-23 NOTE — Progress Notes (Signed)

## 2024-06-27 ENCOUNTER — Ambulatory Visit: Admitting: Nurse Practitioner

## 2024-07-05 ENCOUNTER — Telehealth: Payer: Self-pay

## 2024-07-05 NOTE — Telephone Encounter (Signed)
 Copied from CRM [id number]. Topic: Clinical - Lab/Test Results >> Jul 05, 2024  1:29 PM Nathanel BROCKS wrote: Reason for CRM: pt called and is having a gastroparesis flare up and is heading to the dr now. She is wanting labs drawn. She said that she is so sick and weak that she can barely walk. Called the office to advise.

## 2024-07-05 NOTE — Telephone Encounter (Signed)
Agreeable

## 2024-07-09 NOTE — Nursing Note (Signed)
 Patient dc'd home with family. Pt and family were given dc instructions and both verbalized understanding of dc instructions. Pt's IV was removed and catheter was intact. Pt's vital signs were obtained. Pt was assisted out to vehicle by wheelchair with assistance by the nurse.

## 2024-07-10 NOTE — ED Provider Notes (Signed)
 Emergency Department Provider Note    ED Clinical Impression   Final diagnoses:  Abdominal pain, unspecified abdominal location (Primary)    ED Assessment/Plan    Condition: Stable Disposition: Discharge  This chart has been completed using Dragon Medical Dictation software, and while attempts have been made to ensure accuracy, certain words and phrases may not be transcribed as intended.   History   Chief Complaint  Patient presents with  . Emesis  . Diarrhea   HPI  [name] is a 63 y.o. female  who presents today to the  emergency department complaining of AOC diffuse abd pain and n/v.   She was discharged yesterday.   Has chronic abd pain.      Allergies: is allergic to nitrofurantoin, other, unclassified drug, rosuvastatin  calcium , bisacodyl , cigarette smoke, clarithromycin, cleocin [clindamycin hcl], iron  sucrose, monosodium glutamate, nitrofurantoin macrocrystalline, codeine, morphine , and scopolamine. Medications: has a current medication list which includes the following long-term medication(s): aspirin , duloxetine , levothyroxine , metformin , metoprolol  tartrate, and pravastatin . PMHx:  has a past medical history of Anemia, Breast cancer, Fibromyalgia, Gastric reflux, Headache, High cholesterol, Irregular heart beat, Pneumonia, Pulmonary embolism, and Thyroid  disease. PSHx:  has a past surgical history that includes Other surgical history (2012-2015 ); Breast lumpectomy (2006-2007); Other surgical history; Cholecystectomy; Cesarean section (1986); Hysterectomy (1987); Joint replacement; and Appendectomy. SocHx:  reports that she has never smoked. She has never used smokeless tobacco. She reports that she does not drink alcohol and does not use drugs. Allergies, Medications, Medical, Surgical, and Social History were reviewed as documented above.   Social Drivers of  Health with Concerns   Physical Activity: Inactive (07/10/2024)   Exercise Vital Sign   . Days of Exercise per Week: 0 days   . Minutes of Exercise per Session: 0 min  Social Connections: Moderately Isolated (07/10/2024)   Social Connection and Isolation Panel   . Frequency of Communication with Friends and Family: More than three times a week   . Frequency of Social Gatherings with Friends and Family: More than three times a week   . Attends Religious Services: Never   . Active Member of Clubs or Organizations: No   . Attends Banker Meetings: Never   . Marital Status: Married  Health Literacy: Medium Risk (07/10/2024)   Health Literacy   . : Rarely     Review Of Systems  Review of Systems  Constitutional:  Negative for chills and fever.  Respiratory:  Negative for cough and shortness of breath.   Cardiovascular:  Negative for chest pain.  Gastrointestinal:  Positive for abdominal pain, nausea and vomiting. Negative for diarrhea.  Genitourinary:  Negative for dysuria and frequency.  Musculoskeletal:  Negative for myalgias and neck pain.  Skin:  Negative for rash and wound.  Neurological:  Negative for dizziness, weakness and headaches.    Physical Exam   BP 90/60   Pulse 87   Temp 36.7 C (98 F) (Oral)   Resp 15   SpO2 97%   Physical Exam Constitutional:      Appearance: Normal appearance. She is obese. She is not ill-appearing or diaphoretic.  HENT:     Head: Normocephalic.  Eyes:     Conjunctiva/sclera: Conjunctivae normal.  Cardiovascular:     Rate and Rhythm: Normal rate and regular  rhythm.     Pulses: Normal pulses.     Heart sounds: Normal heart sounds.  Pulmonary:     Effort: Pulmonary effort is normal. No respiratory distress.     Breath sounds: Normal breath sounds. No wheezing.  Abdominal:     General: Abdomen is flat. Bowel sounds are normal. There is no distension.     Palpations: Abdomen is soft.     Tenderness: There is abdominal  tenderness.  Musculoskeletal:     Cervical back: Normal range of motion and neck supple.  Skin:    General: Skin is warm.  Neurological:     General: No focal deficit present.     Mental Status: She is alert.  Psychiatric:        Mood and Affect: Mood normal.     ED Course  Medical Decision Making CT essentially unchanged;  unable to obtain labs;  pt now resting and sipping sprite;  states she would like to go home.      Procedures   No results found for this visit on 07/10/24 (from the past 4464 hours).   ED Results No results found for any visits on 07/10/24. CT Abdomen Pelvis Wo Contrast Result Date: 07/10/2024 Exam: CT of the Abdomen and Pelvis without Contrast  History: Abdominal pain, discharged yesterday  Technique: Routine CT of the abdomen and pelvis without IV contrast. AEC (automated exposure control) and/or manual techniques such as size-specific kV and mAs are employed where appropriate to reduce radiation exposure for all CT exams.  Comparison:  CT abdomen pelvis 07/06/2024; CT abdomen pelvis 10/25/2021  Findings: LOWER THORAX:  Atelectasis.  LIVER:  Normal size. Smooth contour. No focal lesion.  BILIARY: Status post cholecystectomy. Similar mild intra- or extrahepatic biliary ductal dilation.  PANCREAS: Normal background parenchyma. No focal lesion. Main pancreatic duct normal in caliber.  SPLEEN: Normal size.  ADRENALS:  Normal morphology.  KIDNEYS/URETERS:  No hydroureteronephrosis. No nephroureteral calculus. No suspicious lesion.  BOWEL:  Stomach demonstrates mild distention without other abnormality. Postsurgical change of multiple prior bowel surgeries. There is slightly increased mild diffuse fluid and gaseous distention of the small and large bowel without definite discrete transition point.  Status post appendectomy.  VASCULAR:  Normal caliber. No significant atherosclerosis.  LYMPH NODES:  No pathologic lymphadenopathy.  PERITONEUM: No significant free fluid. No  pneumoperitoneum.  PELVIC ORGANS:  Status post total hysterectomy. No adnexal lesion. Urinary bladder decompressed.  BONES:  No aggressive lesion. No acute abnormality.  SOFT TISSUES: Right breast implant reconstruction right flank soft tissue gas and subcutaneous stranding, likely medication injection related. Ventral diastasis.    1.    Slightly increased mild diffuse fluid and gaseous distention of the small and large bowel without definite discrete transition point. Findings again may represent ileus or incomplete obstruction. 2.    Similar mild dilatation of the biliary system, may be secondary to postcholecystectomy reservoir effect. Correlate with laboratory markers.  Signed (Electronic Signature): 07/10/2024 5:04 PM Signed By: Dayton Gower, MD   Medications Administered:  Medications  HYDROmorphone  (DILAUDID ) injection 2 mg (2 mg Intramuscular Given 07/10/24 1633)  promethazine  (PHENERGAN ) injection 12.5 mg (12.5 mg Intramuscular Given 07/10/24 1633)  metoclopramide  (REGLAN ) injection 20 mg (20 mg Intramuscular Given 07/10/24 1753)    Discharge Medications (Medications Prescribed during this  ED visit and Patient's Home Medications) :    Your Medication List     START taking these medications    metoclopramide  10 MG tablet Commonly known as: REGLAN  Take 1  tablet (10 mg total) by mouth every six (6) hours for 7 days.       ASK your doctor about these medications    ALPRAZolam  1 MG tablet Commonly known as: XANAX  Take 1 tablet (1 mg total) by mouth two (2) times a day as needed.   aspirin  81 MG chewable tablet Chew 1 tablet (81 mg total) in the morning.   butalbital -acetaminophen -caffeine  50-325-40 mg per tablet Commonly known as: ESGIC Take 1 tablet by mouth daily as needed.   conjugated estrogens  0.625 mg/gram vaginal cream Commonly known as: PREMARIN  Insert 0.6 g into the vagina in the morning.   cyanocobalamin  (vitamin B-12) 1,000 mcg/mL injection ADMINISTER 1 ML IN  THE MUSCLE EVERY 30 DAYS   cyclobenzaprine  10 MG tablet Commonly known as: FLEXERIL  Take 1 tablet (10 mg total) by mouth two (2) times a day as needed.   cycloSPORINE  0.05 % Drop Administer 1 drop to both eyes two (2) times a day.   DULoxetine  30 MG capsule Commonly known as: CYMBALTA  Take 1 capsule (30 mg total) by mouth in the morning.   ergocalciferol -1,250 mcg (50,000 unit) 1,250 mcg (50,000 unit) capsule Commonly known as: DRISDOL  Take 1 capsule (50,000 Units total) by mouth once a week.   ezetimibe  10 mg tablet Commonly known as: ZETIA  Take 1 tablet (10 mg total) by mouth in the morning.   HYDROmorphone  4 MG tablet Commonly known as: DILAUDID  Take 1 tablet (4 mg total) by mouth Three (3) times a day as needed.   ICAPS AREDS2 250 mg-200 unit -12.5 mg-1 mg Cap Generic drug: vit C-E-zinc  ox-copp-lut-zeax Take 1 capsule by mouth daily.   levothyroxine  125 MCG tablet Commonly known as: SYNTHROID  Take 1 tablet (125 mcg total) by mouth in the morning.   LINZESS  290 mcg capsule Generic drug: linaclotide  Take 1 capsule (290 mcg total) by mouth in the morning.   meclizine  25 mg tablet Commonly known as: ANTIVERT  Take 25 mg by mouth Three (3) times a day as needed.   metFORMIN  500 MG 24 hr tablet Commonly known as: GLUCOPHAGE -XR Take 4 tablets (2,000 mg total) by mouth daily with evening meal. 2 @ night   metoPROLOL  tartrate 25 MG tablet Commonly known as: Lopressor  Take 1 tablet (25 mg total) by mouth two (2) times a day as needed.   omeprazole  40 MG capsule Commonly known as: PriLOSEC Take 1 capsule (40 mg total) by mouth two (2) times a day.   pravastatin  40 MG tablet Commonly known as: PRAVACHOL  Take 1 tablet (40 mg total) by mouth in the morning.   promethazine  25 MG tablet Commonly known as: PHENERGAN  Take 1 tablet (25 mg total) by mouth every eight (8) hours as needed.   rivaroxaban  20 mg tablet Commonly known as: XARELTO  Take 1 tablet (20 mg total) by  mouth daily with evening meal.   sennosides-docusate sodium  8.6-50 mg Cap Take 2 caplet by mouth two (2) times a day.          Hunter Jeoffrey Murray, [place] 07/10/24 [phone]

## 2024-07-10 NOTE — Progress Notes (Unsigned)
 PROVIDER NOTE: Interpretation of information contained herein should be left to medically-trained personnel. Specific patient instructions are provided elsewhere under Patient Instructions section of medical record. This document was created in part using AI and STT-dictation technology, any transcriptional errors that may result from this process are unintentional.  Patient: [name]  Service: E/M   PCP: Antonetta Rollene BRAVO, MD  DOB: Mar 24, 1961  DOS: 07/11/2024  Provider: Emmy MARLA Blanch, NP  MRN: [id number]  Delivery: Face-to-face  Specialty: Interventional Pain Management  Type: Established Patient  Setting: Ambulatory outpatient facility  Specialty designation: 09  Referring Prov.: Antonetta Rollene BRAVO, MD  Location: Outpatient office facility       History of present illness (HPI) [name], a 63 y.o. year old female, is here today because of her No primary diagnosis found.. [name] primary complain today is No chief complaint on file.  Pertinent problems: [name] does not have any pertinent problems on file.  Pain Assessment: Severity of   is reported as a  /10. Location:    / . Onset:  . Quality:  . Timing:  . Modifying factor(s):  SABRA Vitals:  vitals were not taken for this visit.  BMI: Estimated body mass index is 30.93 kg/m as calculated from the following:   Height as of 05/15/24: 5' 4.5 (1.638 m).   Weight as of 05/15/24: 183 lb (83 kg).  Last encounter: Visit date not found. Last procedure: Visit date not found.  Reason for encounter: post-procedure evaluation and assessment.    Procedure Procedure: Cervical Epidural Steroid injection (CESI) (Interlaminar) #1  Laterality: Right  Level: C7-T1 Imaging: Fluoroscopy-assisted DOS: 05/15/2024  Performed by: Wallie Sherry, MD Anesthesia: Local anesthesia (1-2% Lidocaine ) Sedation: Minimal Sedation                          Purpose: Diagnostic/Therapeutic Indications: Cervicalgia, cervical  radicular pain, degenerative disc disease, severe enough to impact quality of life or function. 1. Cervical radicular pain   2. Cervicalgia     NAS-11 score:              Pre-procedure: 7 /10              Post-procedure: 7 /10  Pharmacotherapy Assessment   {There is no content from the last Subjective section.}  Monitoring: [place] PMP: PDMP reviewed during this encounter.       Pharmacotherapy: No side-effects or adverse reactions reported. Compliance: No problems identified. Effectiveness: Clinically acceptable.  No notes on file  UDS:  No results found for: SUMMARY  No results found for: CBDTHCR No results found for: D8THCCBX No results found for: D9THCCBX  ROS  Constitutional: Denies any fever or chills Gastrointestinal: No reported hemesis, hematochezia, vomiting, or acute GI distress Musculoskeletal: Denies any acute onset joint swelling, redness, loss of ROM, or weakness Neurological: No reported episodes of acute onset apraxia, aphasia, dysarthria, agnosia, amnesia, paralysis, loss of coordination, or loss of consciousness  Medication Review  ALPRAZolam , DULoxetine , HYDROmorphone , Lactobacillus, MegaRed Omega-3 Krill Oil, Melatonin, Multiple Vitamins-Minerals, OVER THE COUNTER MEDICATION, Polyethyl Glycol-Propyl Glycol, Prasterone, Rimegepant Sulfate, Vitamin D  (Ergocalciferol ), acyclovir  ointment, aspirin , botulinum toxin Type A , conjugated estrogens , cyanocobalamin , cycloSPORINE , cyclobenzaprine , ezetimibe , fenofibrate , furosemide , levothyroxine , linaclotide , magnesium  oxide, metFORMIN , metoCLOPramide , naloxone, omeprazole , potassium chloride , pravastatin , promethazine , and rivaroxaban   History Review  Allergy: Ms. Potts is allergic to nitrofurantoin, crestor  [rosuvastatin  calcium ], bisacodyl , codeine, iron  sucrose, monosodium glutamate, scopolamine hbr, morphine , and other. Drug: Ms. Aveni  reports  no history of drug use. Alcohol:  reports no history of alcohol  use. Tobacco:  reports that she has never smoked. She has never used smokeless tobacco. Social: Ms. Mackert  reports that she has never smoked. She has never used smokeless tobacco. She reports that she does not drink alcohol and does not use drugs. Medical:  has a past medical history of Abdominal hernia (08/14/2019), Abdominal pain (06/06/2019), Acute bronchitis (05/01/2013), Acute cystitis (06/09/2010), Acute renal insufficiency (08/28/2011), Allergy, Anemia, Anxiety, Anxiety state (03/16/2008), Arthritis, Back pain with radiation (07/17/2014), Bilateral hand pain (07/27/2016), Blood transfusion without reported diagnosis, Breast cancer (HCC), Cancer (HCC), Carpal tunnel syndrome, Chronic constipation, Chronic pain syndrome, Clotting disorder (HCC), Depression, Dermatitis (12/15/2011), Difficult intubation, Educated about COVID-19 virus infection (05/14/2019), Fall at home (01/26/2016), Family history of adverse reaction to anesthesia, Fatigue (09/17/2012), Fibromyalgia, FIBROMYALGIA (03/16/2008), GERD (gastroesophageal reflux disease), Headache disorder (01/16/2013), Headache syndrome (01/26/2016), Heart murmur (1996), Hip pain, right (07/08/2019), History of MRSA infection, History of ovarian cyst (06/2011), History of pulmonary embolus (PE) (1997), History of supraventricular tachycardia, History of TIA (transient ischemic attack) (1997), Hyperlipidemia, Hyperlipidemia LDL goal <100 (03/16/2008), Hypothyroidism, Incisional hernia, Insomnia (12/15/2011), Intermittent palpitations (08/22/2017), Interstitial cystitis, INTERSTITIAL CYSTITIS (02/17/2011), Iron  deficiency anemia, Irregular heart rate (07/25/2013), Lipoma of back, Low back pain radiating to right leg (07/04/2019), Low ferritin level (06/14/2014), MDD (major depressive disorder), single episode, in full remission (HCC) (10/27/2017), Medically noncompliant (02/28/2015), Metabolic syndrome X (02/10/2011), Migraines, Morbid obesity (HCC) (03/27/2013),  Muscle spasm (01/03/2020), Nausea alone (07/17/2014), NECK PAIN, CHRONIC (03/16/2008), Normal coronary arteries, Obesity (03/16/2008), Oral ulceration (08/23/2014), Other malaise and fatigue (03/16/2008), OVARIAN CYST (12/26/2009), Paget disease of breast, left (HCC), Paget's disease of breast, left (HCC) (03/16/2008), Partial small bowel obstruction (HCC) (07/04/2012), PONV (postoperative nausea and vomiting), Postsurgical menopause (11/13/2011), Presence of IVC filter (06/06/2019), PSVT (paroxysmal supraventricular tachycardia) (HCC), Recurrent oral herpes simplex infection (10/13/2017), ROM (right otitis media) (01/23/2013), S/P insertion of IVC (inferior vena caval) filter (05/08/2005), S/P radiofrequency ablation operation for arrhythmia (1996), Sinusitis, chronic (01/26/2016), SMALL BOWEL OBSTRUCTION, HX OF (08/07/2008), Stroke (HCC), Swelling of hand (09/17/2012), Syncope, Tachycardia (02/03/2010), Thyroid  disease, TOS (thoracic outlet syndrome), Ulcer (12/15/2011), Urinary frequency (07/18/2014), Vaginitis and vulvovaginitis (05/10/2013), Vitamin D  deficiency (05/05/2015), and Wears glasses. Surgical: Ms. Wendt  has a past surgical history that includes Vena cava filter placement (05/08/2005   @[facility] ); Carpal tunnel release (Right, ?); Cesarean section (1986); Colon surgery; Abdominal hysterectomy (1987); Bilateral salpingoophorectomy (07/24/2011); Cardiac electrophysiology study and ablation (1996  and 1997); Anterior cervical decomp/discectomy fusion (03-07-2002   dr elsner  @[facility] ); Appendectomy (1980); Eenterocutaneous fistula closure (multiple); Mastectomy (Left, 2006); Breast implant removal (Bilateral); Breast enhancement surgery (Bilateral, 1993); CYSTO/  HYDRODISTENTION/  INSTILATION THERAPY (MULTIPLE); EXPLORATORY LAPAROTOMY INCISIONAL VENTRAL HERNIA REPAIR / RESECTION SMALL BOWEL (11-09-2014   @[facility] ); Cardiovascular stress test (11-18-2015   dr fernande); transthoracic echocardiogram (02/04/2016);  WIDE EXCISION PERIRECTAL ABSCESSES (09-22-2005   @ [facility]); Cardiac catheterization (07-06-2003   dr wolm brodie); Total colectomy (08-04-2002    @[facility] ); Lipoma excision (Right, 09/16/2018); Breast biopsy (Right, 2019); Augmentation mammaplasty (Right, 2006); Breast surgery (N/A); Cholecystectomy (N/A); Cesarean section (N/A); Fracture surgery (N/A); Joint replacement (N/A); Small intestine surgery (N/A); Spine surgery (N/A); Tubal ligation (N/A); Colonoscopy with propofol  (N/A, 05/02/2021); Esophagogastroduodenoscopy (egd) with propofol  (N/A, 05/02/2021); Savory dilation (05/02/2021); polypectomy (05/02/2021); biopsy (05/02/2021); Esophagogastroduodenoscopy (egd) with propofol  (N/A, 12/04/2021); biopsy (12/04/2021); Esophagogastroduodenoscopy (egd) with propofol  (N/A, 03/31/2022); Botox  injection (N/A, 03/31/2022); biopsy (03/31/2022); polypectomy (03/31/2022); UPPER EXTREMITY VENOGRAPHY (N/A, 09/13/2023); PERIPHERAL VASCULAR BALLOON ANGIOPLASTY (Right, 09/13/2023);  Hernia repair (2012-2015); and Cosmetic surgery (2006). Family: family history includes Alcohol abuse in her father; Bone cancer in her maternal grandfather; Breast cancer in her cousin, maternal aunt, maternal aunt, maternal aunt, maternal aunt, and maternal aunt; Cancer in her maternal aunt, maternal aunt, and maternal uncle; Colon cancer in her maternal aunt; Congestive Heart Failure in her father, maternal aunt, and mother; Diabetes in her mother; Heart attack in her maternal aunt; Heart disease in her father and mother; Hyperlipidemia in her father; Hypertension in her father and mother; Migraines in her daughter, maternal grandmother, and mother; Other in her mother; Ovarian cancer (age of onset: 74) in her cousin; Prostate cancer in her maternal uncle; Rheum arthritis in her maternal aunt; Stroke in her mother; Supraventricular tachycardia in her daughter.  Laboratory Chemistry Profile   Renal Lab Results  Component Value Date   BUN 12  05/23/2024   CREATININE 0.86 05/23/2024   BCR 18 05/05/2024   GFRAA 84 01/15/2021   GFRNONAA >60 05/23/2024    Hepatic Lab Results  Component Value Date   AST 24 05/23/2024   ALT 15 05/23/2024   ALBUMIN  3.6 05/23/2024   ALKPHOS 51 05/23/2024   HCVAB <0.1 01/27/2016   LIPASE 49 04/13/2024    Electrolytes Lab Results  Component Value Date   NA 138 05/23/2024   K 3.8 05/23/2024   CL 105 05/23/2024   CALCIUM  8.8 (L) 05/23/2024   MG 1.2 (L) 05/23/2024   PHOS 3.8 06/03/2022    Bone Lab Results  Component Value Date   VD25OH 50.60 05/23/2024   VD125OH2TOT 22 09/23/2011   [id number] 22 09/23/2011   [id number] <8 09/23/2011    Inflammation (CRP: Acute Phase) (ESR: Chronic Phase) Lab Results  Component Value Date   ESRSEDRATE 8 04/02/2022   LATICACIDVEN 1.2 04/22/2023         Note: Above Lab results reviewed.  Recent Imaging Review  DG PAIN CLINIC C-ARM 1-60 MIN NO REPORT Fluoro was used, but no Radiologist interpretation will be provided.  Please refer to NOTES tab for provider progress note. Note: Reviewed        Physical Exam  Vitals: There were no vitals taken for this visit. BMI: Estimated body mass index is 30.93 kg/m as calculated from the following:   Height as of 05/15/24: 5' 4.5 (1.638 m).   Weight as of 05/15/24: 183 lb (83 kg). Ideal: Patient weight not recorded General appearance: Well nourished, well developed, and well hydrated. In no apparent acute distress Mental status: Alert, oriented x 3 (person, place, & time)       Respiratory: No evidence of acute respiratory distress Eyes: PERLA   Assessment   Diagnosis Status  No diagnosis found. Controlled Controlled Controlled   Updated Problems: No problems updated.  Plan of Care  Problem-specific:  Assessment and Plan            Ms. Allecia Bells has a current medication list which includes the following long-term medication(s): duloxetine , ezetimibe , fenofibrate , furosemide ,  levothyroxine , linaclotide , metoclopramide , omeprazole , potassium chloride , pravastatin , and xarelto .  Pharmacotherapy (Medications Ordered): No orders of the defined types were placed in this encounter.  Orders:  No orders of the defined types were placed in this encounter.    {There is no content from the last Plan section.}   No follow-ups on file.    Recent Visits Date Type Provider Dept  05/15/24 Procedure visit Marcelino Nurse, MD [facility]  Showing recent visits within past 90 days and  meeting all other requirements Future Appointments Date Type Provider Dept  07/11/24 Appointment Jennifier Smitherman K, NP [facility]  Showing future appointments within next 90 days and meeting all other requirements  I discussed the assessment and treatment plan with the patient. The patient was provided an opportunity to ask questions and all were answered. The patient agreed with the plan and demonstrated an understanding of the instructions.  Patient advised to call back or seek an in-person evaluation if the symptoms or condition worsens.  Duration of encounter: *** minutes.  Total time on encounter, as per AMA guidelines included both the face-to-face and non-face-to-face time personally spent by the physician and/or other qualified health care professional(s) on the day of the encounter (includes time in activities that require the physician or other qualified health care professional and does not include time in activities normally performed by clinical staff). Physician's time may include the following activities when performed: Preparing to see the patient (e.g., pre-charting review of records, searching for previously ordered imaging, lab work, and nerve conduction tests) Review of prior analgesic pharmacotherapies. Reviewing PMP Interpreting ordered tests (e.g., lab work, imaging, nerve conduction tests) Performing post-procedure evaluations, including interpretation of  diagnostic procedures Obtaining and/or reviewing separately obtained history Performing a medically appropriate examination and/or evaluation Counseling and educating the patient/family/caregiver Ordering medications, tests, or procedures Referring and communicating with other health care professionals (when not separately reported) Documenting clinical information in the electronic or other health record Independently interpreting results (not separately reported) and communicating results to the patient/ family/caregiver Care coordination (not separately reported)  Note by: Jasean Ambrosia K Ladonne Sharples, NP (TTS and AI technology used. I apologize for any typographical errors that were not detected and corrected.) Date: 07/11/2024; Time: 3:23 PM

## 2024-07-11 ENCOUNTER — Other Ambulatory Visit: Payer: Self-pay | Admitting: Family Medicine

## 2024-07-11 ENCOUNTER — Ambulatory Visit (HOSPITAL_BASED_OUTPATIENT_CLINIC_OR_DEPARTMENT_OTHER): Admitting: Nurse Practitioner

## 2024-07-11 DIAGNOSIS — Z91199 Patient's noncompliance with other medical treatment and regimen due to unspecified reason: Secondary | ICD-10-CM

## 2024-07-11 DIAGNOSIS — G894 Chronic pain syndrome: Secondary | ICD-10-CM

## 2024-07-14 ENCOUNTER — Other Ambulatory Visit: Payer: Self-pay | Admitting: Family Medicine

## 2024-07-14 NOTE — Telephone Encounter (Signed)
 Copied from CRM [id number]. Topic: Clinical - Medication Refill >> Jul 14, 2024  4:38 PM [name] wrote: Medication: cyclobenzaprine  (FLEXERIL ) 10 MG tablet   Has the patient contacted their pharmacy? Yes, told out of fills   This is the patient's preferred pharmacy:  [facility] [phone] - [place], [place] - [address] AT [facility] [address] [place] [address] Phone: [phone] Fax: [phone]   Is this the correct pharmacy for this prescription? Yes If no, delete pharmacy and type the correct one.   Has the prescription been filled recently? Yes  Is the patient out of the medication? Yes  Has the patient been seen for an appointment in the last year OR does the patient have an upcoming appointment? Yes  Can we respond through MyChart? Yes  Agent: Please be advised that Rx refills may take up to 3 business days. We ask that you follow-up with your pharmacy.

## 2024-07-17 ENCOUNTER — Other Ambulatory Visit: Payer: Self-pay | Admitting: Family Medicine

## 2024-07-17 ENCOUNTER — Telehealth: Payer: Self-pay

## 2024-07-17 NOTE — Telephone Encounter (Signed)
Appt scheduled pt aware  

## 2024-07-17 NOTE — Telephone Encounter (Signed)
 Copied from CRM [id number]. Topic: Clinical - Request for Lab/Test Order >> Jul 14, 2024  4:34 PM [name] wrote: Reason for CRM: Was seen in ED for  at [facility] for chest pain/abdominal pain & was sent home with low magnesium ,pottassium,abnormal BNP (904). Patient is wanting to know if Dr. Antonetta can put in orders for updated labs. Patient is also wanting to know if Dr. Antonetta would be willing to review prior labs done in ED and compare to new ones please. Patient states she isn't nauseated anymore but she is cramping and has pain in her fingers/toes. Ty, # [phone]

## 2024-07-19 ENCOUNTER — Ambulatory Visit (INDEPENDENT_AMBULATORY_CARE_PROVIDER_SITE_OTHER): Admitting: Family Medicine

## 2024-07-19 VITALS — BP 94/64 | HR 92 | Resp 18 | Ht 64.5 in | Wt 185.1 lb

## 2024-07-19 DIAGNOSIS — Z86711 Personal history of pulmonary embolism: Secondary | ICD-10-CM | POA: Diagnosis not present

## 2024-07-19 DIAGNOSIS — E039 Hypothyroidism, unspecified: Secondary | ICD-10-CM

## 2024-07-19 DIAGNOSIS — Z09 Encounter for follow-up examination after completed treatment for conditions other than malignant neoplasm: Secondary | ICD-10-CM | POA: Diagnosis not present

## 2024-07-19 DIAGNOSIS — K56609 Unspecified intestinal obstruction, unspecified as to partial versus complete obstruction: Secondary | ICD-10-CM | POA: Diagnosis not present

## 2024-07-19 DIAGNOSIS — R7989 Other specified abnormal findings of blood chemistry: Secondary | ICD-10-CM

## 2024-07-19 DIAGNOSIS — E782 Mixed hyperlipidemia: Secondary | ICD-10-CM

## 2024-07-19 DIAGNOSIS — R112 Nausea with vomiting, unspecified: Secondary | ICD-10-CM | POA: Insufficient documentation

## 2024-07-19 DIAGNOSIS — R1114 Bilious vomiting: Secondary | ICD-10-CM

## 2024-07-19 DIAGNOSIS — F419 Anxiety disorder, unspecified: Secondary | ICD-10-CM

## 2024-07-19 MED ORDER — CYCLOBENZAPRINE HCL 10 MG PO TABS: 10.0000 mg | ORAL_TABLET | Freq: Two times a day (BID) | ORAL | 5 refills | Status: AC | PRN

## 2024-07-19 NOTE — Patient Instructions (Addendum)
 Pls reschedule f/u to end November  Nurse Pls add to labs today, CBC and diff and TSH  Pls schedule mammogram at checkout, requests [place], mobile mammogram  Thanks for choosing [facility], we consider it a privelige to serve you.

## 2024-07-19 NOTE — Assessment & Plan Note (Signed)
 Ongoing nausea and vomiting post recent d/c for partialbowel obsrtuction at [facility]

## 2024-07-20 ENCOUNTER — Other Ambulatory Visit: Payer: Self-pay | Admitting: Family Medicine

## 2024-07-20 ENCOUNTER — Ambulatory Visit: Payer: Self-pay | Admitting: Family Medicine

## 2024-07-20 DIAGNOSIS — E038 Other specified hypothyroidism: Secondary | ICD-10-CM

## 2024-07-21 LAB — CMP14+EGFR
ALT: 16 IU/L (ref 0–32)
AST: 20 IU/L (ref 0–40)
Albumin: 3.9 g/dL (ref 3.9–4.9)
Alkaline Phosphatase: 87 IU/L (ref 44–121)
BUN/Creatinine Ratio: 16 (ref 12–28)
BUN: 16 mg/dL (ref 8–27)
Bilirubin Total: 0.4 mg/dL (ref 0.0–1.2)
CO2: 23 mmol/L (ref 20–29)
Calcium: 9.1 mg/dL (ref 8.7–10.3)
Chloride: 102 mmol/L (ref 96–106)
Creatinine, Ser: 1.03 mg/dL — ABNORMAL HIGH (ref 0.57–1.00)
Globulin, Total: 2.3 g/dL (ref 1.5–4.5)
Glucose: 88 mg/dL (ref 70–99)
Potassium: 3.9 mmol/L (ref 3.5–5.2)
Sodium: 141 mmol/L (ref 134–144)
Total Protein: 6.2 g/dL (ref 6.0–8.5)
eGFR: 61 mL/min/1.73 (ref >59)

## 2024-07-21 LAB — VITAMIN D 25 HYDROXY (VIT D DEFICIENCY, FRACTURES): Vit D, 25-Hydroxy: 38.8 ng/mL (ref 30.0–100.0)

## 2024-07-21 LAB — LIPID PANEL W/O CHOL/HDL RATIO
Cholesterol, Total: 143 mg/dL (ref 100–199)
HDL: 43 mg/dL (ref >39)
LDL Chol Calc (NIH): 70 mg/dL (ref 0–99)
Triglycerides: 179 mg/dL — ABNORMAL HIGH (ref 0–149)
VLDL Cholesterol Cal: 30 mg/dL (ref 5–40)

## 2024-07-21 LAB — MAGNESIUM: Magnesium: 1.1 mg/dL — ABNORMAL LOW (ref 1.6–2.3)

## 2024-07-21 LAB — TSH: TSH: 0.554 u[IU]/mL (ref 0.450–4.500)

## 2024-07-21 LAB — BRAIN NATRIURETIC PEPTIDE: BNP: 29.8 pg/mL (ref 0.0–100.0)

## 2024-07-24 ENCOUNTER — Other Ambulatory Visit: Payer: Self-pay

## 2024-07-25 ENCOUNTER — Other Ambulatory Visit: Payer: Self-pay | Admitting: Family Medicine

## 2024-07-27 ENCOUNTER — Other Ambulatory Visit: Payer: Self-pay | Admitting: Family Medicine

## 2024-07-27 ENCOUNTER — Encounter: Payer: Self-pay | Admitting: Family Medicine

## 2024-07-27 ENCOUNTER — Encounter: Attending: Family Medicine | Admitting: Emergency Medicine

## 2024-07-27 DIAGNOSIS — Z1231 Encounter for screening mammogram for malignant neoplasm of breast: Secondary | ICD-10-CM

## 2024-07-27 DIAGNOSIS — R790 Abnormal level of blood mineral: Secondary | ICD-10-CM | POA: Insufficient documentation

## 2024-07-27 MED ORDER — MAGNESIUM SULFATE 2 GM/50ML IV SOLN
2.0000 g | Freq: Once | INTRAVENOUS | Status: AC
  Administered 2024-07-27: 2 g via INTRAVENOUS
  Filled 2024-07-27: qty 50

## 2024-07-27 NOTE — Progress Notes (Signed)
 Diagnosis: Hypomagnesemia   Provider:  Antonetta Quant MD  Procedure: IV Infusion  IV Type: Peripheral, IV Location: R Forearm  Magnesium , Dose: 2g  Infusion Start Time: 1042  Infusion Stop Time: 1240  Post Infusion IV Care: Peripheral IV Discontinued  Discharge: Condition: Good, Destination: Home . AVS Declined  Performed by:  Delon ONEIDA Officer, RN

## 2024-07-31 ENCOUNTER — Encounter: Payer: Self-pay | Admitting: Family Medicine

## 2024-07-31 DIAGNOSIS — R7989 Other specified abnormal findings of blood chemistry: Secondary | ICD-10-CM | POA: Insufficient documentation

## 2024-07-31 DIAGNOSIS — Z09 Encounter for follow-up examination after completed treatment for conditions other than malignant neoplasm: Secondary | ICD-10-CM | POA: Insufficient documentation

## 2024-07-31 DIAGNOSIS — E039 Hypothyroidism, unspecified: Secondary | ICD-10-CM | POA: Insufficient documentation

## 2024-07-31 MED ORDER — XARELTO 20 MG PO TABS: ORAL_TABLET | ORAL | 5 refills | Status: AC

## 2024-07-31 NOTE — Assessment & Plan Note (Signed)
 Updated lab needed at/ before next visit.

## 2024-07-31 NOTE — Assessment & Plan Note (Signed)
 Controlled, no change in medication

## 2024-07-31 NOTE — Progress Notes (Signed)
   [name]     MRN: [id number]      DOB: [birth year]  Chief Complaint  Patient presents with   Hospitalization Follow-up    Pt was admitted to [facility] 07/10-07/13 and then seen in [facility] ED 07/14 for abdominal pain and vomiting. No longer experiencing nausea but is still experiencing cramping and pain in finger/toes. Is wanting updated labs     HPI [name] is here for follow up of hospitalization from 7/10 to 7/13 /2025 due to SBO  due to adhesions, which was managed conservatively, returned day after d/c with simi;lar symptoms and was d/c from the ED She was alos hypotensive  and had gentle hydration via IV Heparin  used in place of xarelto  due to h/o PE ROS Denies recent fever or chills. Denies sinus pressure, nasal congestion, ear pain or sore throat. Denies chest congestion, productive cough or wheezing. Denies chest pains, palpitations and leg swelling Denies abdominal pain, still c/o nausea   Denies dysuria, frequency, hesitancy or incontinence.  Denies headaches, seizures, numbness, or tingling. Denies depression, has chronic  anxiety denies  insomnia. Denies skin break down or rash.   PE  BP 94/64   Pulse 92   Resp 18   Ht 5' 4.5 (1.638 m)   Wt 185 lb 1.3 oz (84 kg)   SpO2 93%   BMI 31.28 kg/m   Patient alert and oriented and in no cardiopulmonary distress.  HEENT: No facial asymmetry, EOMI,     Neck supple .  Chest: Clear to auscultation bilaterally.  CVS: S1, S2 no murmurs, no S3.Regular rate.  ABD: distended, diffuse superficial tenderness no  guarding Ext: No edema  MS: Adequate ROM spine, shoulders, hips and knees.  Skin: Intact, no ulcerations or rash noted.  Psych: Good eye contact, normal affect. Memory intact not anxious or depressed appearing.  CNS: CN 2-12 intact, power,  normal throughout.no focal deficits noted.   Assessment & Plan  Nausea & vomiting Ongoing nausea and vomiting post recent d/c for partialbowel obsrtuction at  [facility]  SBO (small bowel obstruction) (HCC) Recurrent problem due to adhesuions. Managed conservatively during recent hospitalization, reports nausea only at visit, advised should symptoms of pain , vomiting, distension and reduced/ no flatus or feces needs ED eval, she understands though is frustrated at recurrences with no solution  Hospital discharge follow-up Patient in for follow up of recent hospitalization. Discharge summary, and laboratory and radiology data are reviewed, and any questions or concerns  are discussed. Specific issues requiring follow up are specifically addressed.   Hypomagnesemia Needs IV repletion  Hypothyroid Updated lab needed    Chronic anxiety Controlled, no change in medication   Hyperlipidemia Hyperlipidemia:Low fat diet discussed and encouraged.   Lipid Panel  Lab Results  Component Value Date   CHOL 143 07/19/2024   HDL 43 07/19/2024   LDLCALC 70 07/19/2024   LDLDIRECT 138 (H) 09/15/2012   TRIG 179 (H) 07/19/2024   CHOLHDL 3.2 12/03/2023     Needs to reduce dietary fat  Low vitamin D  level Updated lab needed at/ before next visit.

## 2024-07-31 NOTE — Assessment & Plan Note (Addendum)
 Updated lab needed.

## 2024-07-31 NOTE — Assessment & Plan Note (Signed)
 Needs IV repletion

## 2024-07-31 NOTE — Assessment & Plan Note (Signed)
 Hyperlipidemia:Low fat diet discussed and encouraged.   Lipid Panel  Lab Results  Component Value Date   CHOL 143 07/19/2024   HDL 43 07/19/2024   LDLCALC 70 07/19/2024   LDLDIRECT 138 (H) 09/15/2012   TRIG 179 (H) 07/19/2024   CHOLHDL 3.2 12/03/2023     Needs to reduce dietary fat

## 2024-07-31 NOTE — Assessment & Plan Note (Signed)
Patient in for follow up of recent hospitalization. Discharge summary, and laboratory and radiology data are reviewed, and any questions or concerns  are discussed. Specific issues requiring follow up are specifically addressed.  

## 2024-07-31 NOTE — Assessment & Plan Note (Signed)
 Recurrent problem due to adhesuions. Managed conservatively during recent hospitalization, reports nausea only at visit, advised should symptoms of pain , vomiting, distension and reduced/ no flatus or feces needs ED eval, she understands though is frustrated at recurrences with no solution

## 2024-08-01 ENCOUNTER — Ambulatory Visit: Attending: Cardiology | Admitting: Cardiology

## 2024-08-01 VITALS — BP 120/81 | HR 84 | Ht 64.5 in | Wt 197.0 lb

## 2024-08-01 DIAGNOSIS — I482 Chronic atrial fibrillation, unspecified: Secondary | ICD-10-CM | POA: Diagnosis not present

## 2024-08-01 DIAGNOSIS — R079 Chest pain, unspecified: Secondary | ICD-10-CM

## 2024-08-01 DIAGNOSIS — D6869 Other thrombophilia: Secondary | ICD-10-CM | POA: Diagnosis not present

## 2024-08-01 DIAGNOSIS — R002 Palpitations: Secondary | ICD-10-CM | POA: Diagnosis not present

## 2024-08-01 DIAGNOSIS — I480 Paroxysmal atrial fibrillation: Secondary | ICD-10-CM

## 2024-08-01 NOTE — Progress Notes (Unsigned)
 Electrophysiology Office Note:   Date:  08/02/2024  ID:  [name], DOB June 12, 1961, MRN [id number]  Primary Cardiologist: Stanly DELENA Leavens, MD Electrophysiologist: Fonda Kitty, MD      History of Present Illness:   [name] is a 63 y.o. female with h/o palpitations, hyperlipidemia, chest pain, prior history of DVT, hx of PE in 1990's, s/p IVC filter in 08-07-05, SVT, ?A-fib/A-flutter, thyroid  disease, Sjogren's, fibromyalgia, IDA, hx of stroke, migraines, breast cancer, hx of syncope, ovarian cancer misdiagnosis (multiple GI complications and surgeries, with remote hx of sepsis and renal failure) who is being seen today for follow up.  Discussed the use of AI scribe software for clinical note transcription with the patient, who gave verbal consent to proceed.  History of Present Illness [name] is a 63 year old female who presents with chest pain and electrolyte imbalances secondary to gastroparesis and malabsorption.  She has been experiencing significant issues with gastroparesis, leading to rapid weight fluctuations, losing up to fifty pounds quickly due to inability to eat and frequent vomiting. These symptoms have been particularly problematic over the last year.  She has symptoms of palpitations, with heart rates fluctuating between 121 and 146 bpm, which appear to coincide with episodes of dehydration and electrolyte disturbances.  She was recently treated with magnesium  infusions at an infusion center. Despite these treatments, her electrolyte levels are not stabilizing due to absorption issues related to having only eight feet of bowel.  She also has chest pain that radiates to her right side, up her right neck, into face, and down her arm.  She has been diagnosed with thoracic outlet syndrome on that side.  She is scheduled for a rib excision to alleviate symptoms. Her chest pain used to be temporarily relieved by drinking cold liquids, but this  is no longer effective.  Her family history is significant for cardiac issues; her father died of a heart attack in 08-08-2019, and her mother passed away in 08/07/21 from diabetic ketoacidosis. She is currently taking metoprolol  as needed for blood pressure fluctuations.   Review of systems complete and found to be negative unless listed in HPI.   EP Information / Studies Reviewed:    EKG is ordered today. Personal review as below.  EKG Interpretation Date/Time:  Tuesday August 01 2024 15:37:59 EDT Ventricular Rate:  84 PR Interval:  122 QRS Duration:  70 QT Interval:  388 QTC Calculation: 458 R Axis:   54  Text Interpretation: Normal sinus rhythm Low voltage QRS When compared with ECG of 28-Mar-2022 16:22, No significant change was found Confirmed by Kitty Fonda [id number]) on 08/01/2024 3:51:07 PM   Echo 09/16/23:   1. Left ventricular ejection fraction, by estimation, is 55 to 60%. The  left ventricle has normal function. The left ventricle has no regional  wall motion abnormalities. Left ventricular diastolic parameters are  consistent with Grade I diastolic  dysfunction (impaired relaxation).   2. Right ventricular systolic function is normal. The right ventricular  size is normal. Tricuspid regurgitation signal is inadequate for assessing  PA pressure.   3. The mitral valve is grossly normal. Trivial mitral valve  regurgitation.   4. The aortic valve is tricuspid. Aortic valve regurgitation is not  visualized.   5. The inferior vena cava is normal in size with greater than 50%  respiratory variability, suggesting right atrial pressure of 3 mmHg.   Zio 04/2023:    Patient had a minimum heart rate of 54 bpm, maximum  heart rate of 194 bpm, and average heart rate of 80 bpm.   Predominant underlying rhythm was sinus rhythm.   Paroxysmal SVT lasting 15 beats at longest with a max rate of 194 bpm at fastest.   Isolated PACs were rare (<1.0%).   Isolated PVCs were rare (<1.0%).    Triggered and diary events associated with sinus rhythm, PACs, PVCs, and SVT.   Rarely symptomatic rare SVT, with symptoms with sinus rhythm and PACs.   Physical Exam:   VS:  BP 120/81   Pulse 84   Ht 5' 4.5 (1.638 m)   Wt 197 lb (89.4 kg)   SpO2 96%   BMI 33.29 kg/m    Wt Readings from Last 3 Encounters:  08/01/24 197 lb (89.4 kg)  07/19/24 185 lb 1.3 oz (84 kg)  05/15/24 183 lb (83 kg)     GEN: Well nourished, well developed in no acute distress NECK: No JVD CARDIAC: Normal rate, regular rhythm RESPIRATORY:  Clear to auscultation without rales, wheezing or rhonchi  ABDOMEN: Soft, non-distended EXTREMITIES:  No edema; No deformity   ASSESSMENT AND PLAN:    #. Palpitations and paroxysmal AF: Appear to coincide with dehydration and electrolyte disturbances.  Unfortunately, this has been difficult to manage due to her history of gastroparesis and bowel resections. - Encouraged adequate hydration and electrolyte supplementation.  Close follow-up with gastroenterology to help manage. - Her dehydration has resulted in some hypotension.  For this reason, we will not order standing beta-blocker or calcium  channel blocker.  She will continue to use metoprolol  as needed for palpitations.  #. Hypercoagulable state due to AF:  #. H/o DVT/PE: - Continue Xarelto   #.  Chest pain: This appears to be secondary to her recent diagnosis of thoracic outlet syndrome.  She has history of coronary CTA x 2, most recently just 2 years ago.  At that time, she had normal coronary origins and no evidence of CAD.  I believe a coronary etiology is unlikely given this.  She will proceed with her thoracic outlet syndrome surgery and we will see if this results in resolution of her symptoms.  If symptoms persist, then PET scan to look for microvascular disease would be reasonable.  Follow up with PCP.  I do not see any current EP needs.  Signed, Fonda Kitty, MD

## 2024-08-01 NOTE — Patient Instructions (Signed)
 Medication Instructions:  Your physician has recommended you make the following change in your medication:  1) STOP taking Lasix  (furosemide )  *If you need a refill on your cardiac medications before your next appointment, please call your pharmacy*  Follow-Up: At [facility], you and your health needs are our priority.  As part of our continuing mission to provide you with exceptional heart care, our providers are all part of one team.  This team includes your primary Cardiologist (physician) and Advanced Practice Providers or APPs (Physician Assistants and Nurse Practitioners) who all work together to provide you with the care you need, when you need it.  Your next appointment:   As needed with Dr. Kennyth

## 2024-08-06 ENCOUNTER — Other Ambulatory Visit: Payer: Self-pay | Admitting: Family Medicine

## 2024-08-07 ENCOUNTER — Ambulatory Visit (INDEPENDENT_AMBULATORY_CARE_PROVIDER_SITE_OTHER): Admitting: Gastroenterology

## 2024-08-08 ENCOUNTER — Ambulatory Visit

## 2024-08-09 ENCOUNTER — Other Ambulatory Visit: Payer: Self-pay

## 2024-08-10 ENCOUNTER — Encounter (HOSPITAL_COMMUNITY): Payer: Self-pay

## 2024-08-10 NOTE — Progress Notes (Signed)
 PCP - Dr Rollene Pesa Cardiologist - Dr Stanly Leavens EP - Dr Fonda Kitty  Chest x-ray - n/a EKG - 08/01/24 Stress Test - 11/18/15 ECHO - 09/16/23 Cardiac Cath - 07/06/03  ICD Pacemaker/Loop - n/a  Sleep Study -  n/a  Diabetes - n/a  Blood Thinner Instructions:  Last dose of Xarelto  was on Friday, 08/11/24 at 9 am..  Stop Xarelto  now until after surgery per MD.  Aspirin  Instructions: Continue per MD.  NPO   Anesthesia review: Chaney Rake, PA (hx difficult intubation).  Patient thinks something was mentioned in 2022 at [facility] with Botox  Injections.  STOP now taking any Aspirin  (unless otherwise instructed by your surgeon), Aleve , Naproxen , Ibuprofen , Motrin , Advil , Goody's, BC's, all herbal medications, fish oil, and all vitamins.   Coronavirus Screening Do you have any of the following symptoms:  Cough yes/no: No Fever (>100.43F)  yes/no: No Runny nose occasional r/t seasonal allergies Sore throat yes/no: No Difficulty breathing/shortness of breath  yes/no: No  Have you traveled in the last 14 days and where? yes/no: No  Patient verbalized understanding of instructions that were given to them at the PAT appointment. Patient was also instructed that they will need to review over the PAT instructions again at home before surgery.

## 2024-08-10 NOTE — Progress Notes (Signed)
 Surgical Instructions   Your procedure is scheduled on Tuesday, August 19th, 2025. Report to [facility] Main Entrance A at 5:30 A.M., then check in with the Admitting office. Any questions or running late day of surgery: call [phone]  Questions prior to your surgery date: call [phone], Monday-Friday, 8am-4pm. If you experience any cold or flu symptoms such as cough, fever, chills, shortness of breath, etc. between now and your scheduled surgery, please notify us  at the above number.     Remember:  Do not eat or drink after midnight the night before your surgery    Take these medicines the morning of surgery with A SIP OF WATER : Alprazolam  (Xanax ) Aspirin  Duloxetine  (Cymbalta ) Hydromorphone  (Dilaudid ) Levothyroxine  (Synthroid ) Linaclotide  (Linzess ) Omeprazole  (Prilosec)   May take these medicines IF NEEDED: Cyclobenzaprine  (Flexeril ) Cyclosporine  (Restasis ) Metoclopramide  (Reglan ) Metoprolol  Succinate (Toprol -XL)   Per your surgeon's instructions, Xarelto  should be held for 3 days prior to your surgery.  Your last dose should be on Friday, August 15th, 2025.  One week prior to surgery, STOP taking any Aleve , Naproxen , Ibuprofen , Motrin , Advil , Goody's, BC's, all herbal medications, fish oil, and non-prescription vitamins.                     Do NOT Smoke (Tobacco/Vaping) for 24 hours prior to your procedure.  If you use a CPAP at night, you may bring your mask/headgear for your overnight stay.   You will be asked to remove any contacts, glasses, piercing's, hearing aid's, dentures/partials prior to surgery. Please bring cases for these items if needed.    Patients discharged the day of surgery will not be allowed to drive home, and someone needs to stay with them for 24 hours.  SURGICAL WAITING ROOM VISITATION Patients may have no more than 2 support people in the waiting area - these visitors may rotate.   Pre-op nurse will coordinate an appropriate time for  1 ADULT support person, who may not rotate, to accompany patient in pre-op.  Children under the age of 60 must have an adult with them who is not the patient and must remain in the main waiting area with an adult.  If the patient needs to stay at the hospital during part of their recovery, the visitor guidelines for inpatient rooms apply.  Please refer to the [facility] website for the visitor guidelines for any additional information.   If you received a COVID test during your pre-op visit  it is requested that you wear a mask when out in public, stay away from anyone that may not be feeling well and notify your surgeon if you develop symptoms. If you have been in contact with anyone that has tested positive in the last 10 days please notify you surgeon.      Pre-operative CHG Bathing Instructions   You can play a key role in reducing the risk of infection after surgery. Your skin needs to be as free of germs as possible. You can reduce the number of germs on your skin by washing with CHG (chlorhexidine  gluconate) soap before surgery. CHG is an antiseptic soap that kills germs and continues to kill germs even after washing.   DO NOT use if you have an allergy to chlorhexidine /CHG or antibacterial soaps. If your skin becomes reddened or irritated, stop using the CHG and notify one of our RNs at [phone].              TAKE A SHOWER THE NIGHT BEFORE SURGERY AND  THE DAY OF SURGERY    Please keep in mind the following:  DO NOT shave, including legs and underarms, 48 hours prior to surgery.   You may shave your face before/day of surgery.  Place clean sheets on your bed the night before surgery Use a clean washcloth (not used since being washed) for each shower. DO NOT sleep with pet's night before surgery.  CHG Shower Instructions:  Wash your face and private area with normal soap. If you choose to wash your hair, wash first with your normal shampoo.  After you use shampoo/soap, rinse  your hair and body thoroughly to remove shampoo/soap residue.  Turn the water  OFF and apply half the bottle of CHG soap to a CLEAN washcloth.  Apply CHG soap ONLY FROM YOUR NECK DOWN TO YOUR TOES (washing for 3-5 minutes)  DO NOT use CHG soap on face, private areas, open wounds, or sores.  Pay special attention to the area where your surgery is being performed.  If you are having back surgery, having someone wash your back for you may be helpful. Wait 2 minutes after CHG soap is applied, then you may rinse off the CHG soap.  Pat dry with a clean towel  Put on clean pajamas    Additional instructions for the day of surgery: DO NOT APPLY any lotions, deodorants, cologne, or perfumes.   Do not wear jewelry or makeup Do not wear nail polish, gel polish, artificial nails, or any other type of covering on natural nails (fingers and toes) Do not bring valuables to the hospital. [facility] is not responsible for valuables/personal belongings. Put on clean/comfortable clothes.  Please brush your teeth.  Ask your nurse before applying any prescription medications to the skin.

## 2024-08-11 ENCOUNTER — Encounter (HOSPITAL_COMMUNITY): Payer: Self-pay

## 2024-08-11 ENCOUNTER — Encounter (HOSPITAL_COMMUNITY)
Admission: RE | Admit: 2024-08-11 | Discharge: 2024-08-11 | Disposition: A | Discharge status: Home / Self Care | Source: Ambulatory Visit | Attending: Vascular Surgery | Admitting: Vascular Surgery

## 2024-08-11 ENCOUNTER — Other Ambulatory Visit: Payer: Self-pay

## 2024-08-11 VITALS — BP 115/77 | HR 77 | Temp 98.4°F | Resp 16 | Ht 64.5 in | Wt 197.5 lb

## 2024-08-11 DIAGNOSIS — Z853 Personal history of malignant neoplasm of breast: Secondary | ICD-10-CM | POA: Diagnosis not present

## 2024-08-11 DIAGNOSIS — Z01812 Encounter for preprocedural laboratory examination: Secondary | ICD-10-CM | POA: Insufficient documentation

## 2024-08-11 DIAGNOSIS — Z8673 Personal history of transient ischemic attack (TIA), and cerebral infarction without residual deficits: Secondary | ICD-10-CM | POA: Insufficient documentation

## 2024-08-11 DIAGNOSIS — K219 Gastro-esophageal reflux disease without esophagitis: Secondary | ICD-10-CM | POA: Diagnosis not present

## 2024-08-11 DIAGNOSIS — G894 Chronic pain syndrome: Secondary | ICD-10-CM | POA: Insufficient documentation

## 2024-08-11 DIAGNOSIS — R011 Cardiac murmur, unspecified: Secondary | ICD-10-CM | POA: Diagnosis not present

## 2024-08-11 DIAGNOSIS — M797 Fibromyalgia: Secondary | ICD-10-CM | POA: Insufficient documentation

## 2024-08-11 DIAGNOSIS — E785 Hyperlipidemia, unspecified: Secondary | ICD-10-CM | POA: Diagnosis not present

## 2024-08-11 DIAGNOSIS — D509 Iron deficiency anemia, unspecified: Secondary | ICD-10-CM | POA: Diagnosis not present

## 2024-08-11 DIAGNOSIS — E039 Hypothyroidism, unspecified: Secondary | ICD-10-CM | POA: Diagnosis not present

## 2024-08-11 DIAGNOSIS — N3010 Interstitial cystitis (chronic) without hematuria: Secondary | ICD-10-CM | POA: Diagnosis not present

## 2024-08-11 DIAGNOSIS — G540 Brachial plexus disorders: Secondary | ICD-10-CM | POA: Insufficient documentation

## 2024-08-11 DIAGNOSIS — Z01818 Encounter for other preprocedural examination: Secondary | ICD-10-CM

## 2024-08-11 HISTORY — DX: Paroxysmal atrial fibrillation: I48.0

## 2024-08-11 HISTORY — DX: Palpitations: R00.2

## 2024-08-11 LAB — URINALYSIS, ROUTINE W REFLEX MICROSCOPIC
Bilirubin Urine: NEGATIVE
Glucose, UA: NEGATIVE mg/dL
Hgb urine dipstick: NEGATIVE
Ketones, ur: NEGATIVE mg/dL
Leukocytes,Ua: NEGATIVE
Nitrite: NEGATIVE
Protein, ur: NEGATIVE mg/dL
Specific Gravity, Urine: 1.02 (ref 1.005–1.030)
pH: 5 (ref 5.0–8.0)

## 2024-08-11 LAB — PROTIME-INR
INR: 1.3 — ABNORMAL HIGH (ref 0.8–1.2)
Prothrombin Time: 17.3 s — ABNORMAL HIGH (ref 11.4–15.2)

## 2024-08-11 LAB — COMPREHENSIVE METABOLIC PANEL WITH GFR
ALT: 16 U/L (ref 0–44)
AST: 23 U/L (ref 15–41)
Albumin: 3.6 g/dL (ref 3.5–5.0)
Alkaline Phosphatase: 85 U/L (ref 38–126)
Anion gap: 7 (ref 5–15)
BUN: 15 mg/dL (ref 8–23)
CO2: 22 mmol/L (ref 22–32)
Calcium: 9.3 mg/dL (ref 8.9–10.3)
Chloride: 110 mmol/L (ref 98–111)
Creatinine, Ser: 0.76 mg/dL (ref 0.44–1.00)
GFR, Estimated: >60 mL/min (ref >60)
Glucose, Bld: 82 mg/dL (ref 70–99)
Potassium: 4 mmol/L (ref 3.5–5.1)
Sodium: 139 mmol/L (ref 135–145)
Total Bilirubin: 0.6 mg/dL (ref 0.0–1.2)
Total Protein: 6.6 g/dL (ref 6.5–8.1)

## 2024-08-11 LAB — CBC
HCT: 39.2 % (ref 36.0–46.0)
Hemoglobin: 12.5 g/dL (ref 12.0–15.0)
MCH: 30.1 pg (ref 26.0–34.0)
MCHC: 31.9 g/dL (ref 30.0–36.0)
MCV: 94.5 fL (ref 80.0–100.0)
Platelets: 252 K/uL (ref 150–400)
RBC: 4.15 MIL/uL (ref 3.87–5.11)
RDW: 13.6 % (ref 11.5–15.5)
WBC: 5.9 K/uL (ref 4.0–10.5)
nRBC: 0 % (ref 0.0–0.2)

## 2024-08-11 LAB — SURGICAL PCR SCREEN
MRSA, PCR: NEGATIVE
Staphylococcus aureus: NEGATIVE

## 2024-08-11 LAB — TYPE AND SCREEN
ABO/RH(D): O POS
Antibody Screen: NEGATIVE

## 2024-08-11 LAB — APTT: aPTT: 34 s (ref 24–36)

## 2024-08-14 ENCOUNTER — Ambulatory Visit (INDEPENDENT_AMBULATORY_CARE_PROVIDER_SITE_OTHER): Payer: Medicare Other | Admitting: Gastroenterology

## 2024-08-14 NOTE — Progress Notes (Signed)
 Anesthesia Chart Review:  Case: [id number] Date/Time: 08/15/24 0715   Procedures:      EXCISION, RIB (Right)     INTRA OPERATIVE ARTERIOGRAM - Possible Venogram   Anesthesia type: General   Diagnosis: Thoracic outlet syndrome [G54.0]   Pre-op diagnosis: RIGHT THORACIC OUTLET SYNDROME   Location: [facility] ROOM 12 / [facility]   Surgeons: Sheree Penne Bruckner, MD       DISCUSSION: Patient is a 63 year old female scheduled for the above procedure.  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, TRAM reconstructions 2006), hysterectomy (1987), colonic inertia (s/p total colectomy with ileorectal anastomosis, cholecystectomy 08/04/2004), ovarian cyst (s/p BSO 07/24/2011, complicated by enterocutaneous fistula/ECF), enterocutaneous fistula (multiple procedures including exploratory lap, small bowel resection x2, closure of ECF, VHR, complex abdominal wall closure 02/26/2012; s/p takedown with right rectus flap and mesh reconstruction 08/09/2013; s/p removal of mesh and ostomizing fistula; s/p closure of ECF 11/09/2014), spinal surgery (C4-5 ACDF 03/07/2002), PE (IVC filter 05/09/2015 - 10/07/2020), fibromyalgia, interstitial cystitis, chronic pain syndrome, migraines, vasovagal syncope (~ 2016), MRSA (lip), appendectomy (1980).   DIFFICULT AIRWAY, but with unclear details. Cricoid pressure applied and Mac McGrath used for multiple intubations. Below is summary of available airway evaluations reviewed in [facility] Everywhere: 08/06/2022 ([facility]): Blade size: 3; Laryngoscopic view grade: 1; Cuffed; Insertion attempts: 1; Cricoid pressure: Yes; Insertion site: oral; Placement verification: Bilateral  Breath Sounds, ETCO2; Secured at 21 cm; Measured from Lips; Complex? No; Mask airway: Mask vent not attempted; Video-assisted blade: Mac McGrath   09/16/2018: LMA 4.0  11/09/2014 ([facility]); Blade type: Macintosh; Blade size: 3 McGrath; Laryngoscopic view grade: 1; Airway size (mm): 7.5; Cuffed; Leak: 28 cm H2O; Insertion attempts: 1; Cricoid pressure: Yes; Placement verification: Bilateral Breath Sounds, Chest Rise, Sounds absent over stomach, ETCO2; Secured at 22 cm; Measured from Lips; Complex? No; Mask airway: Mild obstruction; Video-assisted blade: Mac McGrath   07/12/2014 ([facility]): OR; Blade type: Miller; Blade size: 2; Laryngoscopic view grade: 1; Bougie used? No; Airway size (mm): 7.0; Cuffed; Initial leak: Yes; Leak: 21 cm H2O; Insertion attempts: 1; Cricoid pressure: Yes; Placement verification: Bilateral Breath Sounds, Chest Rise, ETCO2, Sounds absent over stomach; Secured at 22 cm; Measured from Lips; Complex? No; Mask airway: Easy  05/03/2014 ([facility]): Blade type: Macintosh; Blade size: 4; Laryngoscopic view grade: 1; Bougie used? No; Airway size (mm): 6.5; Cuffed; Insertion attempts: 1; Cricoid pressure: No; Placement verification: Bilateral Breath Sounds, ETCO2, Chest Rise, Sounds absent over stomach; Secured at 21 cm; Measured from Lips; Complex? No; Mask airway: Easy    Last visit with EP Dr. Kennyth was on 08/01/2024 for follow-up palpitations and PAF.  Symptoms appear to coincide with dehydration and electrolyte disturbances which have been somewhat difficult to manage due to her history of gastroparesis and multiple bowel resections.  Continue close follow-up with GI.  Because dehydration has resulted in some hypotension longstanding beta-blocker calcium  channel blocker was not prescribed daily. She has metoprolol  to use as needed for palpitations, although she reports rare use.  She is on Xarelto  due to PAF and history of DVT/PE.  She also has a history of chest pain with normal coronary CTA's,  last in 2023. Her symptoms are thought to be related to thoracic outlet syndrome as symptoms have been  more right-sided.  If symptoms persist following surgery then a PET scan to look for microvascular disease may be considered.  Dr. Kennyth felt she would not require ongoing EP follow-up--felt she could continue follow-up with PCP.  Received IV magnesium  sulfate on 07/27/24 for hypomagnesemia. Also on oral Mag-ox, KCl.  She reported instructions to hold Xarelto  after 08/11/24 AM dose.   Anesthesia team to evaluate on the day of surgery. Restricted LUE.     VS: BP 115/77   Pulse 77   Temp 36.9 C   Resp 16   Ht 5' 4.5 (1.638 m)   Wt 89.6 kg   SpO2 100%   BMI 33.38 kg/m    PROVIDERS: Antonetta Rollene BRAVO, MD is PCP  Santo Kelly, MD is cardiologist Kennyth Chew, MD is EP cardiologist. As needed EP follow-up planned at 08/01/2024 visit.  Sheree Riis, MD is vascular surgeon  Eartha Flavors, MD is GI Skeet Cornet, DO is neurologist   LABS: Preoperative labs noted. UA showed no leukocytes or nitrites, with microscopic showing many bacteria and calcium  oxalate and mucus present. Labs are marked as reviewed by Dr. Sheree.  (all labs ordered are listed, but only abnormal results are displayed)  Labs Reviewed  PROTIME-INR - Abnormal; Notable for the following components:      Result Value   Prothrombin Time 17.3 (*)    INR 1.3 (*)    All other components within normal limits  URINALYSIS, ROUTINE W REFLEX MICROSCOPIC - Abnormal; Notable for the following components:   APPearance HAZY (*)    Bacteria, UA MANY (*)    All other components within normal limits  SURGICAL PCR SCREEN  CBC  COMPREHENSIVE METABOLIC PANEL WITH GFR  APTT  TYPE AND SCREEN    IMAGES: CT Abd/pelvis 07/10/2024 ([facility]): IMPRESSION: 1.    Slightly increased mild diffuse fluid and gaseous distention of the small and large bowel without definite discrete transition point. Findings again may represent  ileus or incomplete obstruction.  2.    Similar mild dilatation of the biliary system, may be secondary to postcholecystectomy reservoir effect. Correlate with laboratory markers.   MRI C-spine 01/28/2024: MPRESSION: - Degenerative changes of the cervical spine are slightly progressed compared to MRI in 2016. Disc osteophyte complex at C3-4 contributes to moderate spinal canal stenosis, increased from prior. - No cord signal abnormality. - Multilevel foraminal stenosis, greatest and moderate at C3-4.   EKG: EKG 8/52025: Normal sinus rhythm Low voltage QRS When compared with ECG of 28-Mar-2022 16:22, No significant change was found Confirmed by Kennyth Chew [id number]) on 08/01/2024 3:51:07 PM   CV: Echo 02/03/2024 [facility]): Summary   1. Technically difficult study due to chest wall/lung interference.    2. The left ventricle is normal in size with normal wall thickness.    3. The left ventricular systolic function is normal, LVEF is visually  estimated at > 55%.    4. The right ventricle is normal in size, with normal systolic function.  - Comparison 09/16/2023: LVEF 55-60%, no RWMA, grade 1 DD, normal RV systolic function, trivial MR   US  Venous Duplex 10/13/2023: Summary:  Right:  - No evidence of deep vein thrombosis in the upper extremity. No evidence of superficial vein thrombosis in the upper extremity.  - Given concern for right upper extremity venous thoracic outlet syndrome, pulsed wave Doppler evaluation was performed on the right proximal subclavian vein at rest and with abduction and external shoulder rotation; proximal subclavian vein velocities significantly increased  from 23 cm/s to 145cm/s with maneuver, suggestive of extrinsic compression with abduction and external rotation.    Left:  - Patient expresses recent onset similar symptoms in left upper extremity. Left proximal subclavian vein velocities increased from 38 cm/s to 79 cm/s with maneuver.    Long term Zio  monitor 04/16/2023 - 04/29/2023:   Patient had a minimum heart rate of 54 bpm, maximum heart rate of 194 bpm, and average heart rate of 80 bpm.   Predominant underlying rhythm was sinus rhythm.   Paroxysmal SVT lasting 15 beats at longest with a max rate of 194 bpm at fastest.   Isolated PACs were rare (<1.0%).   Isolated PVCs were rare (<1.0%).   Triggered and diary events associated with sinus rhythm, PACs, PVCs, and SVT.   Rarely symptomatic rare SVT, with symptoms with sinus rhythm and PACs.   CTA Coronary 03/06/2022: IMPRESSION: 1. No evidence of CAD, CADRADS = 0. 2. Coronary calcium  score of 0. This was 0 percentile for age and sex matched control. 3. Normal coronary origin with left dominance. 4. Dilated main pulmonary artery at 30 mm, suggestive of pulmonary hypertension. 5. Compared to prior CT coronary angiogram ON 01/11/2019, there has been no change.   Past Medical History:  Diagnosis Date   Abdominal hernia 08/14/2019   Abdominal pain 06/06/2019   Acute bronchitis 05/01/2013   Acute cystitis 06/09/2010   Qualifier: Diagnosis of  By: Charlsie LPN, Brandi     Acute renal insufficiency 08/28/2011   Allergy    Phreesia 03/16/2021   Anemia    Phreesia 10/29/2020   Anxiety    Anxiety state 03/16/2008   Qualifier: Diagnosis of  By: Tita Lipps     Arthritis    Phreesia 10/29/2020   Back pain with radiation 07/17/2014   Bilateral hand pain 07/27/2016   Blood transfusion without reported diagnosis    Phreesia 10/29/2020   Breast cancer (HCC)    L breast- 2006   Cancer (HCC)    Phreesia 10/29/2020   Carpal tunnel syndrome    Chronic constipation    Chronic pain syndrome    followed by [facility]---  back   Clotting disorder (HCC)    Phreesia 10/29/2020   Depression    Dermatitis 12/15/2011   Difficult intubation    Patient thinks something was mentioned in 2022 at [facility] with Botox  Injections.   Educated about COVID-19 virus infection 05/14/2019    Fall at home 01/26/2016   Family history of adverse reaction to anesthesia    MOTHER--- PONV   Fatigue 09/17/2012   Fibromyalgia    FIBROMYALGIA 03/16/2008   Qualifier: Diagnosis of  By: Tita Lipps     GERD (gastroesophageal reflux disease)    Headache disorder 01/16/2013   Headache syndrome 01/26/2016   Heart murmur 1996   Hip pain, right 07/08/2019   History of blood transfusion 08/2016   in [facility]   History of MRSA infection    lip abscess   History of ovarian cyst 06/2011   s/p  BSO   History of pulmonary embolus (PE) 1997   post EP with ablation pulmonary veouns for SVT/ Atrial Fib.; also on 09/22/22 in [facility]   History of supraventricular tachycardia    s/p  ablation 1996  and 1997  by dr fernande   History of TIA (transient ischemic attack) 1997   post op EP ablation PE   Hyperlipidemia    Hyperlipidemia LDL goal <100 03/16/2008   Qualifier: Diagnosis  of  By: Tita Lipps     Hypothyroidism    followed by pcp   Incisional hernia    Insomnia 12/15/2011   Intermittent palpitations 08/22/2017   Interstitial cystitis    09-13-2018   per pt last flare-up  May 2019 (followed by pcp)   INTERSTITIAL CYSTITIS 02/17/2011   Qualifier: Diagnosis of  By: Antonetta MD, Margaret     Iron  deficiency anemia    09-13-2018  PER PT STABLE   Irregular heart rate 07/25/2013   Lipoma of back    upper   Low back pain radiating to right leg 07/04/2019   Low ferritin level 06/14/2014   MDD (major depressive disorder), single episode, in full remission (HCC) 10/27/2017   Medically noncompliant 02/28/2015   Multiple missed appointments, both follow-up appointments and lab appointments.    Metabolic syndrome X 02/10/2011   Qualifier: Diagnosis of  By: Antonetta MD, Margaret  hBA1c is 5.8 in 02/2013    Migraines    Morbid obesity (HCC) 03/27/2013   Muscle spasm 01/03/2020   Nausea alone 07/17/2014   NECK PAIN, CHRONIC 03/16/2008   Qualifier: Diagnosis of  By: Tita Lipps     Normal coronary  arteries    a. by CT 12/2018.   Obesity 03/16/2008   Qualifier: Diagnosis of  By: Tita Lipps     Oral ulceration 08/23/2014   Presented at 06/14/2014 visit    Other malaise and fatigue 03/16/2008   Centricity Description: FATIGUE, CHRONIC Qualifier: Diagnosis of  By: Tita Lipps   Centricity Description: FATIGUE Qualifier: Diagnosis of  By: Windell PA, Dawn     OVARIAN CYST 12/26/2009   Qualifier: Diagnosis of  By: Antonetta MD, Margaret     Paget's disease of breast, left (HCC) 03/16/2008   Qualifier: Diagnosis of  By: Tita Lipps  Left diagnosed in 2006 F/h breast cancer x 15 family members   Palpitations    hx- appears to coincide with dehydration and electrolyte disturbances - see 08/01/24 note   Paroxysmal A-fib (HCC)    hx - appears to coincide with dehydration and electrolyte disturbances - see 08/01/24 note   Partial small bowel obstruction (HCC) 07/04/2012   Pneumonia    x 2   PONV (postoperative nausea and vomiting)    SEVERE   Postsurgical menopause 11/13/2011   Presence of IVC filter 06/06/2019   PSVT (paroxysmal supraventricular tachycardia) (HCC)    HX ABLATION 1996 AND 1997   Recurrent oral herpes simplex infection 10/13/2017   ROM (right otitis media) 01/23/2013   S/P insertion of IVC (inferior vena caval) filter 05/08/2005   greenfield (non-retrievable)  /  dx 2019  a leg of filter is protruding thru the vena cava in to right L2 vertebral body (09-13-2018  per pt having surgery to remove filter in [place])   S/P radiofrequency ablation operation for arrhythmia 1996   1996  and 1997,   SVT and Atrial Fib   Sinusitis, chronic 01/26/2016   SMALL BOWEL OBSTRUCTION, HX OF 08/07/2008   Annotation: obstruction w/ adhesions led to partial colectomy Qualifier: Diagnosis of  By: Georgina Palma     Stroke [facility])    Phreesia 10/29/2020, TIAs in the past -1989   Swelling of hand 09/17/2012   Syncope    Tachycardia 02/03/2010   Qualifier: Diagnosis of  By: Via LPN, Lynn      Thyroid  disease    Phreesia 10/29/2020   TOS (thoracic outlet syndrome)    right side   Ulcer 12/15/2011  Urinary frequency 07/18/2014   Vaginitis and vulvovaginitis 05/10/2013   Vitamin D  deficiency 05/05/2015   Wears glasses     Past Surgical History:  Procedure Laterality Date   ABDOMINAL HYSTERECTOMY  1987   ANTERIOR CERVICAL DECOMP/DISCECTOMY FUSION  03-07-2002   dr elsner  @[facility]    C 4 -- 5   APPENDECTOMY  1980   AUGMENTATION MAMMAPLASTY Right 2006   BILATERAL SALPINGOOPHORECTOMY  07/24/2011   via Explor. Lap. w/ intraoperative perf. bowel repair   BIOPSY  05/02/2021   Procedure: BIOPSY;  Surgeon: Eartha Flavors, Toribio, MD;  [facility];  Service: Gastroenterology;;  small bowel, mid esophagus, distal esophagus, random colon biopsies   BIOPSY  12/04/2021   Procedure: BIOPSY;  Surgeon: Cindie Carlin POUR, DO;  [facility];  Service: Endoscopy;;   BIOPSY  03/31/2022   Procedure: BIOPSY;  Surgeon: Eartha Flavors Toribio, MD;  [facility];  Service: Gastroenterology;;   BOTOX  INJECTION N/A 03/31/2022   Procedure: BOTOX  INJECTION;  Surgeon: Eartha Flavors Toribio, MD;  [facility];  Service: Gastroenterology;  Laterality: N/A;   BREAST BIOPSY Right 2019   benign   BREAST ENHANCEMENT SURGERY Bilateral 1993   BREAST IMPLANT REMOVAL Bilateral    BREAST SURGERY N/A    Phreesia 10/29/2020   CARDIAC CATHETERIZATION  07-06-2003   dr wolm brodie   normal coronaries and LVF   CARDIAC ELECTROPHYSIOLOGY STUDY AND ABLATION  1996  and 1997   CARDIOVASCULAR STRESS TEST  11-18-2015   dr fernande   normal nuclear study w/ no ischemia/  normal LV function and wall motion , ef 84%   CARPAL TUNNEL RELEASE Right ?   CESAREAN SECTION  1986   CESAREAN SECTION N/A    Phreesia 10/29/2020   CHOLECYSTECTOMY N/A    Phreesia 10/29/2020   COLON SURGERY     COLONOSCOPY WITH PROPOFOL  N/A 05/02/2021   Procedure: COLONOSCOPY WITH PROPOFOL ;   Surgeon: Eartha Flavors Toribio, MD;  [facility];  Service: Gastroenterology;  Laterality: N/A;  12:30 PM   COSMETIC SURGERY  2006   After breast cancer   CYSTO/  HYDRODISTENTION/  INSTILATION THERAPY  MULTIPLE   ENTEROCUTANEOUS FISTULA CLOSURE  multiple   last one 2015 with small bowel resection   ESOPHAGOGASTRODUODENOSCOPY (EGD) WITH PROPOFOL  N/A 05/02/2021   Procedure: ESOPHAGOGASTRODUODENOSCOPY (EGD) WITH PROPOFOL ;  Surgeon: Eartha Flavors Toribio, MD;  [facility];  Service: Gastroenterology;  Laterality: N/A;   ESOPHAGOGASTRODUODENOSCOPY (EGD) WITH PROPOFOL  N/A 12/04/2021   Procedure: ESOPHAGOGASTRODUODENOSCOPY (EGD) WITH PROPOFOL ;  Surgeon: Cindie Carlin POUR, DO;  [facility];  Service: Endoscopy;  Laterality: N/A;   ESOPHAGOGASTRODUODENOSCOPY (EGD) WITH PROPOFOL  N/A 03/31/2022   Procedure: ESOPHAGOGASTRODUODENOSCOPY (EGD) WITH PROPOFOL ;  Surgeon: Eartha Flavors Toribio, MD;  [facility];  Service: Gastroenterology;  Laterality: N/A;  1015   EXPLORATORY LAPAROTOMY INCISIONAL VENTRAL HERNIA REPAIR / RESECTION SMALL BOWEL  11-09-2014   @[facility]    FRACTURE SURGERY N/A    Phreesia 10/29/2020   HERNIA REPAIR  2012-2015   Failed   JOINT REPLACEMENT N/A    Phreesia 10/29/2020   LIPOMA EXCISION Right 09/16/2018   Procedure: EXCISION LIPOMA UPPER BACK;  Surgeon: Signe Mitzie LABOR, MD;  Location: [facility];  Service: General;  Laterality: Right;   MASTECTOMY Left 2006   w/ reconstruction on left (paget's disease)  and right breast augmentation   PERIPHERAL VASCULAR BALLOON ANGIOPLASTY Right 09/13/2023   Procedure: PERIPHERAL VASCULAR BALLOON ANGIOPLASTY;  Surgeon:  Sheree Penne Bruckner, MD;  Location: [facility] INVASIVE CV LAB;  Service: Cardiovascular;  Laterality: Right;  Subclavian and axillary   POLYPECTOMY  05/02/2021   Procedure: POLYPECTOMY;  Surgeon: Eartha Angelia Sieving, MD;  [facility];  Service:  Gastroenterology;;  gastric   POLYPECTOMY  03/31/2022   Procedure: POLYPECTOMY;  Surgeon: Eartha Angelia Sieving, MD;  [facility];  Service: Gastroenterology;;   HARLEY DILATION  05/02/2021   Procedure: HARLEY DILATION;  Surgeon: Eartha Angelia Sieving, MD;  [facility];  Service: Gastroenterology;;   SMALL INTESTINE SURGERY N/A    Phreesia 10/29/2020   SPINE SURGERY N/A    Phreesia 10/29/2020   TOTAL COLECTOMY  08-04-2002    @[facility]    AND CHOLECYSTECTOMY  (colonic inertia)   TRANSTHORACIC ECHOCARDIOGRAM  02/04/2016   ef 60-65%,  grade 2 diastolic dysfunction/  mild MR   TUBAL LIGATION N/A    Phreesia 03/16/2021   UPPER EXTREMITY VENOGRAPHY N/A 09/13/2023   Procedure: UPPER EXTREMITY VENOGRAPHY;  Surgeon: Sheree Penne Bruckner, MD;  Location: [facility] INVASIVE CV LAB;  Service: Cardiovascular;  Laterality: N/A;   VENA CAVA FILTER PLACEMENT  05/08/2005   @[facility]    greenfield (non-retrievable)   WIDE EXCISION PERIRECTAL ABSCESSES  09-22-2005   @ [facility]    MEDICATIONS:  XARELTO  20 MG TABS tablet   acyclovir  ointment (ZOVIRAX ) 5 %   ALPRAZolam  (XANAX ) 1 MG tablet   aspirin  81 MG chewable tablet   botulinum toxin Type A  (BOTOX ) 200 units injection   conjugated estrogens  (PREMARIN ) vaginal cream   cyanocobalamin  (VITAMIN B12) 1000 MCG/ML injection   cyclobenzaprine  (FLEXERIL ) 10 MG tablet   cycloSPORINE  (RESTASIS ) 0.05 % ophthalmic emulsion   DULoxetine  (CYMBALTA ) 60 MG capsule   ezetimibe  (ZETIA ) 10 MG tablet   fenofibrate  (TRICOR ) 48 MG tablet   HYDROmorphone  (DILAUDID ) 4 MG tablet   INTRAROSA 6.5 MG INST   levothyroxine  (SYNTHROID ) 125 MCG tablet   linaclotide  (LINZESS ) 290 MCG CAPS capsule   magnesium  oxide (MAG-OX) 400 (240 Mg) MG tablet   MegaRed Omega-3 Krill Oil 500 MG CAPS   metoCLOPramide  (REGLAN ) 10 MG tablet   metoprolol  succinate (TOPROL -XL) 25 MG 24 hr tablet   Multiple Vitamins-Minerals (ICAPS AREDS 2 PO)   naloxone (NARCAN) nasal spray 4  mg/0.1 mL   omeprazole  (PRILOSEC) 40 MG capsule   OVER THE COUNTER MEDICATION   Polyethylene Glycol 400 (BLINK TEARS OP)   potassium chloride  (KLOR-CON  M) 10 MEQ tablet   pravastatin  (PRAVACHOL ) 20 MG tablet   PROMETHEGAN 25 MG suppository   Rimegepant Sulfate (NURTEC) 75 MG TBDP   Vitamin D , Ergocalciferol , (DRISDOL ) 1.25 MG (50000 UNIT) CAPS capsule   No current facility-administered medications for this encounter.    magnesium  sulfate IVPB 2 g 50 mL    Zenobia Kuennen, PA-C Surgical Short Stay/Anesthesiology [facility] Phone [phone] [facility] Phone [phone] 08/14/2024 10:21 AM

## 2024-08-14 NOTE — Anesthesia Preprocedure Evaluation (Addendum)
 Anesthesia Evaluation    Reviewed: Allergy & Precautions, H&P , Patient's Chart, lab work & pertinent test results  History of Anesthesia Complications (+) PONV, DIFFICULT AIRWAY and history of anesthetic complications  Airway Mallampati: II  TM Distance: >3 FB Neck ROM: Full    Dental no notable dental hx.    Pulmonary neg sleep apnea, PE   Pulmonary exam normal breath sounds clear to auscultation       Cardiovascular (-) hypertension(-) angina (-) Past MI Normal cardiovascular exam+ dysrhythmias Atrial Fibrillation  Rhythm:Regular Rate:Normal  TTE 08/2023  IMPRESSIONS     1. Left ventricular ejection fraction, by estimation, is 55 to 60%. The  left ventricle has normal function. The left ventricle has no regional  wall motion abnormalities. Left ventricular diastolic parameters are  consistent with Grade I diastolic  dysfunction (impaired relaxation).   2. Right ventricular systolic function is normal. The right ventricular  size is normal. Tricuspid regurgitation signal is inadequate for assessing  PA pressure.   3. The mitral valve is grossly normal. Trivial mitral valve  regurgitation.   4. The aortic valve is tricuspid. Aortic valve regurgitation is not  visualized.   5. The inferior vena cava is normal in size with greater than 50%  respiratory variability, suggesting right atrial pressure of 3 mmHg.   Comparison(s): Prior images reviewed side by side. LVEF normal range at  55-60%.      Neuro/Psych  Headaches PSYCHIATRIC DISORDERS Anxiety Depression    TIA   GI/Hepatic Neg liver ROS,GERD  ,,  Endo/Other  Hypothyroidism    Renal/GU Renal disease  negative genitourinary   Musculoskeletal  (+) Arthritis ,  Fibromyalgia -Thoracic outlet syndrome   Abdominal   Peds negative pediatric ROS (+)  Hematology negative hematology ROS (+)   Anesthesia Other Findings   Reproductive/Obstetrics negative OB  ROS                              Anesthesia Physical Anesthesia Plan  ASA: 3  Anesthesia Plan: General   Post-op Pain Management: Ofirmev  IV (intra-op)*   Induction: Intravenous  PONV Risk Score and Plan: 4 or greater and Ondansetron , Dexamethasone , Midazolam  and Treatment may vary due to age or medical condition  Airway Management Planned: Oral ETT  Additional Equipment:   Intra-op Plan:   Post-operative Plan: Extubation in OR  Informed Consent: I have reviewed the patients History and Physical, chart, labs and discussed the procedure including the risks, benefits and alternatives for the proposed anesthesia with the patient or authorized representative who has indicated his/her understanding and acceptance.     Dental advisory given  Plan Discussed with: CRNA  Anesthesia Plan Comments: (See PAT note written 08/14/2024 by Allison Zelenak, PA-C. Multiple bowel procedures, gastroparesis, PAF, DVT/PE history, difficulty airway history (unclear details; cricoid pressure & McGrath MAC Vido laryngoscope used previously).    Patient is a 63 year old female scheduled for the above procedure.   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, TRAM reconstructions 2006), hysterectomy (1987), colonic inertia (s/p total colectomy with ileorectal anastomosis, cholecystectomy 08/04/2004), ovarian cyst (s/p BSO 07/24/2011, complicated by enterocutaneous fistula/ECF), enterocutaneous fistula (multiple procedures including exploratory lap, small bowel resection x2, closure of ECF, VHR, complex abdominal  wall closure 02/26/2012; s/p takedown with right rectus flap  and mesh reconstruction 08/09/2013; s/p removal of mesh and ostomizing fistula; s/p closure of ECF 11/09/2014), spinal surgery (C4-5 ACDF 03/07/2002), PE (IVC filter 05/09/2015 - 10/07/2020), fibromyalgia, interstitial cystitis, chronic pain syndrome, migraines, vasovagal syncope (~ 2016), MRSA (lip), appendectomy (1980).    DIFFICULT AIRWAY, but with unclear details. Cricoid pressure applied and Mac McGrath used for multiple intubations. Below is summary of available airway evaluations reviewed in [facility] Everywhere: 08/06/2022 ([facility]): Blade size: 3; Laryngoscopic view grade: 1; Cuffed; Insertion attempts: 1; Cricoid pressure: Yes; Insertion site: oral; Placement verification: Bilateral Breath Sounds, ETCO2; Secured at 21 cm; Measured from Lips; Complex? No; Mask airway: Mask vent not attempted; Video-assisted blade: Mac McGrath  09/16/2018: LMA 4.0 11/09/2014 ([facility]); Blade type: Macintosh; Blade size: 3 McGrath; Laryngoscopic view grade: 1; Airway size (mm): 7.5; Cuffed; Leak: 28 cm H2O; Insertion attempts: 1; Cricoid pressure: Yes; Placement verification: Bilateral Breath Sounds, Chest Rise, Sounds absent over stomach, ETCO2; Secured at 22 cm; Measured from Lips; Complex? No; Mask airway: Mild obstruction; Video-assisted blade: Mac McGrath  07/12/2014 ([facility]): OR; Blade type: Miller; Blade size: 2; Laryngoscopic view grade: 1; Bougie used? No; Airway size (mm): 7.0; Cuffed; Initial leak: Yes; Leak: 21 cm H2O; Insertion attempts: 1; Cricoid pressure: Yes; Placement verification: Bilateral Breath Sounds, Chest Rise, ETCO2, Sounds absent over stomach; Secured at 22 cm; Measured from Lips; Complex? No; Mask airway: Easy 05/03/2014 ([facility]): Blade type: Macintosh; Blade size: 4; Laryngoscopic view grade: 1; Bougie used? No; Airway size (mm): 6.5; Cuffed; Insertion attempts: 1; Cricoid pressure: No; Placement verification: Bilateral Breath Sounds, ETCO2, Chest Rise, Sounds absent over stomach; Secured at 21  cm; Measured from Lips; Complex? No; Mask airway: Easy    Last visit with EP Dr. Kennyth was on 08/01/2024 for follow-up palpitations and PAF.  Symptoms appear to coincide with dehydration and electrolyte disturbances which have been somewhat difficult to manage due to her history of gastroparesis and multiple bowel resections.  Continue close follow-up with GI.  Because dehydration has resulted in some hypotension longstanding beta-blocker calcium  channel blocker was not prescribed daily. She has metoprolol  to use as needed for palpitations, although she reports rare use.  She is on Xarelto  due to PAF and history of DVT/PE.  She also has a history of chest pain with normal coronary CTA's, last in 2023. Her symptoms are thought to be related to thoracic outlet syndrome as symptoms have been more right-sided.  If symptoms persist following surgery then a PET scan to look for microvascular disease may be considered.  Dr. Kennyth felt she would not require ongoing EP follow-up--felt she could continue follow-up with PCP.   Received IV magnesium  sulfate on 07/27/24 for hypomagnesemia. Also on oral Mag-ox, KCl.   She reported instructions to hold Xarelto  after 08/11/24 AM dose.  )         Anesthesia Quick Evaluation

## 2024-08-15 ENCOUNTER — Encounter (HOSPITAL_COMMUNITY)
Admission: RE | Disposition: A | Discharge status: Home Health | Payer: Self-pay | Source: Home / Self Care | Attending: Vascular Surgery

## 2024-08-15 ENCOUNTER — Encounter (HOSPITAL_COMMUNITY): Payer: Self-pay | Admitting: Vascular Surgery

## 2024-08-15 ENCOUNTER — Inpatient Hospital Stay (HOSPITAL_COMMUNITY): Payer: Self-pay | Admitting: Vascular Surgery

## 2024-08-15 ENCOUNTER — Other Ambulatory Visit: Payer: Self-pay

## 2024-08-15 ENCOUNTER — Inpatient Hospital Stay (HOSPITAL_COMMUNITY)

## 2024-08-15 ENCOUNTER — Inpatient Hospital Stay (HOSPITAL_COMMUNITY): Payer: Self-pay

## 2024-08-15 ENCOUNTER — Inpatient Hospital Stay (HOSPITAL_COMMUNITY)
Admission: RE | Admit: 2024-08-15 | Discharge: 2024-08-22 | DRG: 029 | Disposition: A | Discharge status: Home Health | Attending: Vascular Surgery | Admitting: Vascular Surgery

## 2024-08-15 DIAGNOSIS — Z9089 Acquired absence of other organs: Secondary | ICD-10-CM

## 2024-08-15 DIAGNOSIS — K5900 Constipation, unspecified: Secondary | ICD-10-CM | POA: Diagnosis present

## 2024-08-15 DIAGNOSIS — I871 Compression of vein: Secondary | ICD-10-CM | POA: Diagnosis present

## 2024-08-15 DIAGNOSIS — Z95828 Presence of other vascular implants and grafts: Secondary | ICD-10-CM | POA: Diagnosis not present

## 2024-08-15 DIAGNOSIS — Z981 Arthrodesis status: Secondary | ICD-10-CM

## 2024-08-15 DIAGNOSIS — Z8673 Personal history of transient ischemic attack (TIA), and cerebral infarction without residual deficits: Secondary | ICD-10-CM | POA: Diagnosis not present

## 2024-08-15 DIAGNOSIS — Z86711 Personal history of pulmonary embolism: Secondary | ICD-10-CM

## 2024-08-15 DIAGNOSIS — K567 Ileus, unspecified: Secondary | ICD-10-CM | POA: Diagnosis not present

## 2024-08-15 DIAGNOSIS — Z9079 Acquired absence of other genital organ(s): Secondary | ICD-10-CM | POA: Diagnosis not present

## 2024-08-15 DIAGNOSIS — Z800 Family history of malignant neoplasm of digestive organs: Secondary | ICD-10-CM | POA: Diagnosis not present

## 2024-08-15 DIAGNOSIS — Z79899 Other long term (current) drug therapy: Secondary | ICD-10-CM

## 2024-08-15 DIAGNOSIS — Z803 Family history of malignant neoplasm of breast: Secondary | ICD-10-CM

## 2024-08-15 DIAGNOSIS — Z7901 Long term (current) use of anticoagulants: Secondary | ICD-10-CM | POA: Diagnosis not present

## 2024-08-15 DIAGNOSIS — Z79890 Hormone replacement therapy (postmenopausal): Secondary | ICD-10-CM | POA: Diagnosis not present

## 2024-08-15 DIAGNOSIS — Z90710 Acquired absence of both cervix and uterus: Secondary | ICD-10-CM

## 2024-08-15 DIAGNOSIS — F418 Other specified anxiety disorders: Secondary | ICD-10-CM | POA: Diagnosis not present

## 2024-08-15 DIAGNOSIS — Z8249 Family history of ischemic heart disease and other diseases of the circulatory system: Secondary | ICD-10-CM | POA: Diagnosis not present

## 2024-08-15 DIAGNOSIS — Z9012 Acquired absence of left breast and nipple: Secondary | ICD-10-CM | POA: Diagnosis not present

## 2024-08-15 DIAGNOSIS — E785 Hyperlipidemia, unspecified: Secondary | ICD-10-CM | POA: Diagnosis present

## 2024-08-15 DIAGNOSIS — Z8041 Family history of malignant neoplasm of ovary: Secondary | ICD-10-CM

## 2024-08-15 DIAGNOSIS — Z833 Family history of diabetes mellitus: Secondary | ICD-10-CM

## 2024-08-15 DIAGNOSIS — M797 Fibromyalgia: Secondary | ICD-10-CM | POA: Diagnosis present

## 2024-08-15 DIAGNOSIS — Z7984 Long term (current) use of oral hypoglycemic drugs: Secondary | ICD-10-CM

## 2024-08-15 DIAGNOSIS — Z8614 Personal history of Methicillin resistant Staphylococcus aureus infection: Secondary | ICD-10-CM | POA: Diagnosis not present

## 2024-08-15 DIAGNOSIS — I480 Paroxysmal atrial fibrillation: Secondary | ICD-10-CM | POA: Diagnosis present

## 2024-08-15 DIAGNOSIS — E039 Hypothyroidism, unspecified: Secondary | ICD-10-CM | POA: Diagnosis present

## 2024-08-15 DIAGNOSIS — F411 Generalized anxiety disorder: Secondary | ICD-10-CM | POA: Diagnosis present

## 2024-08-15 DIAGNOSIS — Z83438 Family history of other disorder of lipoprotein metabolism and other lipidemia: Secondary | ICD-10-CM

## 2024-08-15 DIAGNOSIS — K3184 Gastroparesis: Secondary | ICD-10-CM | POA: Diagnosis not present

## 2024-08-15 DIAGNOSIS — Z853 Personal history of malignant neoplasm of breast: Secondary | ICD-10-CM | POA: Diagnosis not present

## 2024-08-15 DIAGNOSIS — G894 Chronic pain syndrome: Secondary | ICD-10-CM | POA: Diagnosis present

## 2024-08-15 DIAGNOSIS — Z811 Family history of alcohol abuse and dependence: Secondary | ICD-10-CM

## 2024-08-15 DIAGNOSIS — Z7982 Long term (current) use of aspirin: Secondary | ICD-10-CM

## 2024-08-15 DIAGNOSIS — Z90722 Acquired absence of ovaries, bilateral: Secondary | ICD-10-CM | POA: Diagnosis not present

## 2024-08-15 DIAGNOSIS — G540 Brachial plexus disorders: Secondary | ICD-10-CM

## 2024-08-15 DIAGNOSIS — M79601 Pain in right arm: Secondary | ICD-10-CM | POA: Diagnosis not present

## 2024-08-15 DIAGNOSIS — Z9049 Acquired absence of other specified parts of digestive tract: Secondary | ICD-10-CM

## 2024-08-15 HISTORY — PX: VENOGRAM: SHX5497

## 2024-08-15 HISTORY — PX: RIB RESECTION: SHX5077

## 2024-08-15 LAB — CBC
HCT: 33.7 % — ABNORMAL LOW (ref 36.0–46.0)
Hemoglobin: 11 g/dL — ABNORMAL LOW (ref 12.0–15.0)
MCH: 30.6 pg (ref 26.0–34.0)
MCHC: 32.6 g/dL (ref 30.0–36.0)
MCV: 93.9 fL (ref 80.0–100.0)
Platelets: 209 K/uL (ref 150–400)
RBC: 3.59 MIL/uL — ABNORMAL LOW (ref 3.87–5.11)
RDW: 13.8 % (ref 11.5–15.5)
WBC: 13.2 K/uL — ABNORMAL HIGH (ref 4.0–10.5)
nRBC: 0 % (ref 0.0–0.2)

## 2024-08-15 LAB — COMPREHENSIVE METABOLIC PANEL WITH GFR
ALT: 21 U/L (ref 0–44)
AST: 47 U/L — ABNORMAL HIGH (ref 15–41)
Albumin: 3.1 g/dL — ABNORMAL LOW (ref 3.5–5.0)
Alkaline Phosphatase: 56 U/L (ref 38–126)
Anion gap: 12 (ref 5–15)
BUN: 18 mg/dL (ref 8–23)
CO2: 20 mmol/L — ABNORMAL LOW (ref 22–32)
Calcium: 8.7 mg/dL — ABNORMAL LOW (ref 8.9–10.3)
Chloride: 108 mmol/L (ref 98–111)
Creatinine, Ser: 1.22 mg/dL — ABNORMAL HIGH (ref 0.44–1.00)
GFR, Estimated: 50 mL/min — ABNORMAL LOW (ref >60)
Glucose, Bld: 180 mg/dL — ABNORMAL HIGH (ref 70–99)
Potassium: 4.7 mmol/L (ref 3.5–5.1)
Sodium: 140 mmol/L (ref 135–145)
Total Bilirubin: 0.2 mg/dL (ref 0.0–1.2)
Total Protein: 5.7 g/dL — ABNORMAL LOW (ref 6.5–8.1)

## 2024-08-15 LAB — PROTIME-INR
INR: 1 (ref 0.8–1.2)
Prothrombin Time: 13.8 s (ref 11.4–15.2)

## 2024-08-15 LAB — HIV ANTIBODY (ROUTINE TESTING W REFLEX): HIV Screen 4th Generation wRfx: NONREACTIVE

## 2024-08-15 LAB — ABO/RH: ABO/RH(D): O POS

## 2024-08-15 SURGERY — EXCISION, RIB
Anesthesia: General | Site: Arm Upper | Laterality: Right

## 2024-08-15 MED ORDER — LIDOCAINE 2% (20 MG/ML) 5 ML SYRINGE
INTRAMUSCULAR | Status: AC
  Filled 2024-08-15: qty 5

## 2024-08-15 MED ORDER — CYCLOBENZAPRINE HCL 10 MG PO TABS
10.0000 mg | ORAL_TABLET | Freq: Two times a day (BID) | ORAL | Status: DC | PRN
  Administered 2024-08-15 – 2024-08-20 (×6): 10 mg via ORAL
  Filled 2024-08-15 (×6): qty 1

## 2024-08-15 MED ORDER — ONDANSETRON HCL 4 MG/2ML IJ SOLN
INTRAMUSCULAR | Status: AC
  Filled 2024-08-15: qty 2

## 2024-08-15 MED ORDER — ALPRAZOLAM 0.5 MG PO TABS
1.0000 mg | ORAL_TABLET | Freq: Two times a day (BID) | ORAL | Status: DC
  Administered 2024-08-15 – 2024-08-22 (×14): 1 mg via ORAL
  Filled 2024-08-15 (×14): qty 2

## 2024-08-15 MED ORDER — PANTOPRAZOLE SODIUM 40 MG PO TBEC
40.0000 mg | DELAYED_RELEASE_TABLET | Freq: Every day | ORAL | Status: DC
  Administered 2024-08-16 – 2024-08-22 (×7): 40 mg via ORAL
  Filled 2024-08-15 (×8): qty 1

## 2024-08-15 MED ORDER — CHLORHEXIDINE GLUCONATE 0.12 % MT SOLN
15.0000 mL | Freq: Once | OROMUCOSAL | Status: AC
  Administered 2024-08-15: 15 mL via OROMUCOSAL
  Filled 2024-08-15: qty 15

## 2024-08-15 MED ORDER — HYDROMORPHONE HCL 1 MG/ML IJ SOLN
INTRAMUSCULAR | Status: AC
  Filled 2024-08-15: qty 1

## 2024-08-15 MED ORDER — FENTANYL CITRATE (PF) 250 MCG/5ML IJ SOLN
INTRAMUSCULAR | Status: DC | PRN
  Administered 2024-08-15 (×3): 50 ug via INTRAVENOUS
  Administered 2024-08-15: 100 ug via INTRAVENOUS

## 2024-08-15 MED ORDER — PHENYLEPHRINE HCL-NACL 20-0.9 MG/250ML-% IV SOLN
INTRAVENOUS | Status: DC | PRN
  Administered 2024-08-15: 20 ug/min via INTRAVENOUS

## 2024-08-15 MED ORDER — PROPOFOL 10 MG/ML IV BOLUS
INTRAVENOUS | Status: AC
  Filled 2024-08-15: qty 20

## 2024-08-15 MED ORDER — FENTANYL CITRATE (PF) 250 MCG/5ML IJ SOLN
INTRAMUSCULAR | Status: AC
  Filled 2024-08-15: qty 5

## 2024-08-15 MED ORDER — PHENYLEPHRINE 80 MCG/ML (10ML) SYRINGE FOR IV PUSH (FOR BLOOD PRESSURE SUPPORT)
PREFILLED_SYRINGE | INTRAVENOUS | Status: DC | PRN
  Administered 2024-08-15 (×2): 80 ug via INTRAVENOUS
  Administered 2024-08-15: 160 ug via INTRAVENOUS

## 2024-08-15 MED ORDER — ROCURONIUM BROMIDE 10 MG/ML (PF) SYRINGE
PREFILLED_SYRINGE | INTRAVENOUS | Status: DC | PRN
  Administered 2024-08-15: 80 mg via INTRAVENOUS
  Administered 2024-08-15: 20 mg via INTRAVENOUS

## 2024-08-15 MED ORDER — LINACLOTIDE 145 MCG PO CAPS
290.0000 ug | ORAL_CAPSULE | Freq: Every day | ORAL | Status: DC
  Administered 2024-08-17 – 2024-08-21 (×5): 290 ug via ORAL
  Filled 2024-08-15 (×7): qty 2

## 2024-08-15 MED ORDER — HYDROMORPHONE HCL 1 MG/ML IJ SOLN
INTRAMUSCULAR | Status: AC
  Filled 2024-08-15: qty 0.5

## 2024-08-15 MED ORDER — IODIXANOL 320 MG/ML IV SOLN
INTRAVENOUS | Status: DC | PRN
Start: 2024-08-15 — End: 2024-08-15
  Administered 2024-08-15: 16 mL via INTRAVENOUS

## 2024-08-15 MED ORDER — ACETAMINOPHEN 10 MG/ML IV SOLN: INTRAVENOUS | Status: DC | PRN

## 2024-08-15 MED ORDER — ORAL CARE MOUTH RINSE: 15.0000 mL | Freq: Once | OROMUCOSAL | Status: AC

## 2024-08-15 MED ORDER — KETOROLAC TROMETHAMINE 30 MG/ML IJ SOLN
30.0000 mg | Freq: Four times a day (QID) | INTRAMUSCULAR | Status: AC
  Administered 2024-08-15 – 2024-08-17 (×8): 30 mg via INTRAVENOUS
  Filled 2024-08-15 (×7): qty 1

## 2024-08-15 MED ORDER — FENOFIBRATE 54 MG PO TABS
54.0000 mg | ORAL_TABLET | Freq: Every day | ORAL | Status: DC
  Administered 2024-08-15 – 2024-08-22 (×8): 54 mg via ORAL
  Filled 2024-08-15 (×8): qty 1

## 2024-08-15 MED ORDER — CEFAZOLIN SODIUM-DEXTROSE 2-4 GM/100ML-% IV SOLN
2.0000 g | Freq: Three times a day (TID) | INTRAVENOUS | Status: AC
  Administered 2024-08-15 (×2): 2 g via INTRAVENOUS
  Filled 2024-08-15: qty 100

## 2024-08-15 MED ORDER — KETOROLAC TROMETHAMINE 0.5 % OP SOLN
OPHTHALMIC | Status: AC
  Filled 2024-08-15: qty 5

## 2024-08-15 MED ORDER — 0.9 % SODIUM CHLORIDE (POUR BTL) OPTIME
TOPICAL | Status: DC | PRN
  Administered 2024-08-15: 1000 mL

## 2024-08-15 MED ORDER — ROCURONIUM BROMIDE 10 MG/ML (PF) SYRINGE
PREFILLED_SYRINGE | INTRAVENOUS | Status: AC
  Filled 2024-08-15: qty 10

## 2024-08-15 MED ORDER — LIDOCAINE 2% (20 MG/ML) 5 ML SYRINGE
INTRAMUSCULAR | Status: DC | PRN
  Administered 2024-08-15: 60 mg via INTRAVENOUS
  Administered 2024-08-15: 80 mg via INTRAVENOUS

## 2024-08-15 MED ORDER — HYDROMORPHONE HCL 2 MG PO TABS
ORAL_TABLET | ORAL | Status: AC
  Filled 2024-08-15: qty 2

## 2024-08-15 MED ORDER — HYDROMORPHONE HCL 1 MG/ML IJ SOLN
0.5000 mg | INTRAMUSCULAR | Status: DC | PRN
  Administered 2024-08-15 – 2024-08-18 (×20): 0.5 mg via INTRAVENOUS
  Filled 2024-08-15 (×19): qty 0.5

## 2024-08-15 MED ORDER — OXYCODONE HCL 5 MG/5ML PO SOLN: 5.0000 mg | Freq: Once | ORAL | Status: DC | PRN

## 2024-08-15 MED ORDER — METOCLOPRAMIDE HCL 10 MG PO TABS
10.0000 mg | ORAL_TABLET | Freq: Four times a day (QID) | ORAL | Status: DC | PRN
  Administered 2024-08-18 – 2024-08-21 (×7): 10 mg via ORAL
  Filled 2024-08-15 (×3): qty 1
  Filled 2024-08-15: qty 2
  Filled 2024-08-15: qty 1
  Filled 2024-08-15: qty 2
  Filled 2024-08-15 (×2): qty 1
  Filled 2024-08-15: qty 2
  Filled 2024-08-15: qty 1
  Filled 2024-08-15: qty 2

## 2024-08-15 MED ORDER — POTASSIUM CHLORIDE CRYS ER 20 MEQ PO TBCR: 20.0000 meq | EXTENDED_RELEASE_TABLET | Freq: Once | ORAL | Status: DC

## 2024-08-15 MED ORDER — FENTANYL CITRATE (PF) 100 MCG/2ML IJ SOLN
INTRAMUSCULAR | Status: AC
  Filled 2024-08-15: qty 2

## 2024-08-15 MED ORDER — HYDROMORPHONE HCL 1 MG/ML IJ SOLN
0.2500 mg | INTRAMUSCULAR | Status: DC | PRN
  Administered 2024-08-15: 0.5 mg via INTRAVENOUS
  Administered 2024-08-15: 0.25 mg via INTRAVENOUS

## 2024-08-15 MED ORDER — CHLORHEXIDINE GLUCONATE CLOTH 2 % EX PADS: 6.0000 | MEDICATED_PAD | Freq: Once | CUTANEOUS | Status: DC

## 2024-08-15 MED ORDER — LACTATED RINGERS IV SOLN: INTRAVENOUS | Status: DC

## 2024-08-15 MED ORDER — PROPOFOL 500 MG/50ML IV EMUL
INTRAVENOUS | Status: DC | PRN
  Administered 2024-08-15: 30 ug/kg/min via INTRAVENOUS

## 2024-08-15 MED ORDER — HEPARIN 6000 UNIT IRRIGATION SOLUTION
Status: DC | PRN
  Administered 2024-08-15: 1

## 2024-08-15 MED ORDER — ACETAMINOPHEN 325 MG RE SUPP: 325.0000 mg | RECTAL | Status: DC | PRN

## 2024-08-15 MED ORDER — EZETIMIBE 10 MG PO TABS
10.0000 mg | ORAL_TABLET | Freq: Every day | ORAL | Status: DC
  Administered 2024-08-15 – 2024-08-21 (×7): 10 mg via ORAL
  Filled 2024-08-15 (×7): qty 1

## 2024-08-15 MED ORDER — ASPIRIN 81 MG PO CHEW
81.0000 mg | CHEWABLE_TABLET | Freq: Every morning | ORAL | Status: DC
  Administered 2024-08-16 – 2024-08-22 (×7): 81 mg via ORAL
  Filled 2024-08-15 (×7): qty 1

## 2024-08-15 MED ORDER — DEXAMETHASONE SODIUM PHOSPHATE 10 MG/ML IJ SOLN
INTRAMUSCULAR | Status: DC | PRN
  Administered 2024-08-15: 10 mg via INTRAVENOUS

## 2024-08-15 MED ORDER — CEFAZOLIN SODIUM-DEXTROSE 2-4 GM/100ML-% IV SOLN
2.0000 g | INTRAVENOUS | Status: AC
  Administered 2024-08-15: 2 g via INTRAVENOUS
  Filled 2024-08-15: qty 100

## 2024-08-15 MED ORDER — HYDRALAZINE HCL 20 MG/ML IJ SOLN: 5.0000 mg | INTRAMUSCULAR | Status: DC | PRN

## 2024-08-15 MED ORDER — PHENOL 1.4 % MT LIQD: 1.0000 | OROMUCOSAL | Status: DC | PRN

## 2024-08-15 MED ORDER — ACETAMINOPHEN 325 MG PO TABS
325.0000 mg | ORAL_TABLET | ORAL | Status: DC | PRN
  Administered 2024-08-18: 650 mg via ORAL
  Filled 2024-08-15: qty 2

## 2024-08-15 MED ORDER — CYCLOBENZAPRINE HCL 10 MG PO TABS
ORAL_TABLET | ORAL | Status: AC
  Filled 2024-08-15: qty 1

## 2024-08-15 MED ORDER — ACETAMINOPHEN 10 MG/ML IV SOLN: 1000.0000 mg | Freq: Once | INTRAVENOUS | Status: DC | PRN

## 2024-08-15 MED ORDER — DULOXETINE HCL 60 MG PO CPEP
60.0000 mg | ORAL_CAPSULE | Freq: Every day | ORAL | Status: DC
  Administered 2024-08-16 – 2024-08-22 (×7): 60 mg via ORAL
  Filled 2024-08-15 (×7): qty 1

## 2024-08-15 MED ORDER — ALUM & MAG HYDROXIDE-SIMETH 200-200-20 MG/5ML PO SUSP: 15.0000 mL | ORAL | Status: DC | PRN

## 2024-08-15 MED ORDER — SUGAMMADEX SODIUM 200 MG/2ML IV SOLN
INTRAVENOUS | Status: DC | PRN
  Administered 2024-08-15: 200 mg via INTRAVENOUS

## 2024-08-15 MED ORDER — OXYCODONE HCL 5 MG PO TABS: 5.0000 mg | ORAL_TABLET | Freq: Once | ORAL | Status: DC | PRN

## 2024-08-15 MED ORDER — GUAIFENESIN-DM 100-10 MG/5ML PO SYRP
15.0000 mL | ORAL_SOLUTION | ORAL | Status: DC | PRN
Start: 2024-08-15 — End: 2024-08-22
  Administered 2024-08-15 – 2024-08-17 (×2): 15 mL via ORAL
  Filled 2024-08-15 (×2): qty 15

## 2024-08-15 MED ORDER — KETAMINE HCL 50 MG/5ML IJ SOSY
PREFILLED_SYRINGE | INTRAMUSCULAR | Status: AC
Start: 2024-08-15 — End: 2024-08-15
  Filled 2024-08-15: qty 5

## 2024-08-15 MED ORDER — HEPARIN 6000 UNIT IRRIGATION SOLUTION
Status: AC
  Filled 2024-08-15: qty 500

## 2024-08-15 MED ORDER — HYDROMORPHONE HCL 2 MG PO TABS
4.0000 mg | ORAL_TABLET | Freq: Three times a day (TID) | ORAL | Status: DC
  Administered 2024-08-15 – 2024-08-22 (×22): 4 mg via ORAL
  Filled 2024-08-15 (×21): qty 2

## 2024-08-15 MED ORDER — BSS IO SOLN
15.0000 mL | Freq: Once | INTRAOCULAR | Status: AC
  Administered 2024-08-15: 15 mL

## 2024-08-15 MED ORDER — ONDANSETRON HCL 4 MG/2ML IJ SOLN
4.0000 mg | Freq: Four times a day (QID) | INTRAMUSCULAR | Status: DC | PRN
  Administered 2024-08-18 – 2024-08-22 (×2): 4 mg via INTRAVENOUS
  Filled 2024-08-15 (×2): qty 2

## 2024-08-15 MED ORDER — METOPROLOL TARTRATE 5 MG/5ML IV SOLN: 2.0000 mg | INTRAVENOUS | Status: DC | PRN

## 2024-08-15 MED ORDER — LABETALOL HCL 5 MG/ML IV SOLN: 10.0000 mg | INTRAVENOUS | Status: DC | PRN

## 2024-08-15 MED ORDER — ONDANSETRON HCL 4 MG/2ML IJ SOLN
INTRAMUSCULAR | Status: DC | PRN
  Administered 2024-08-15: 4 mg via INTRAVENOUS

## 2024-08-15 MED ORDER — LEVOTHYROXINE SODIUM 125 MCG PO TABS
125.0000 ug | ORAL_TABLET | Freq: Every day | ORAL | Status: DC
  Administered 2024-08-16 – 2024-08-22 (×7): 125 ug via ORAL
  Filled 2024-08-15 (×7): qty 1

## 2024-08-15 MED ORDER — KETAMINE HCL 10 MG/ML IJ SOLN
INTRAMUSCULAR | Status: DC | PRN
  Administered 2024-08-15: 10 mg via INTRAVENOUS
  Administered 2024-08-15 (×2): 20 mg via INTRAVENOUS

## 2024-08-15 MED ORDER — KETOROLAC TROMETHAMINE 0.5 % OP SOLN
1.0000 [drp] | Freq: Four times a day (QID) | OPHTHALMIC | Status: DC
  Administered 2024-08-15: 1 [drp] via OPHTHALMIC

## 2024-08-15 MED ORDER — SODIUM CHLORIDE 0.9 % IV SOLN: INTRAVENOUS | Status: AC

## 2024-08-15 MED ORDER — DEXAMETHASONE SODIUM PHOSPHATE 10 MG/ML IJ SOLN
INTRAMUSCULAR | Status: AC
Start: 2024-08-15 — End: 2024-08-15
  Filled 2024-08-15: qty 1

## 2024-08-15 MED ORDER — LACTATED RINGERS IV SOLN
INTRAVENOUS | Status: DC | PRN
Start: 2024-08-15 — End: 2024-08-15

## 2024-08-15 MED ORDER — KETOROLAC TROMETHAMINE 30 MG/ML IJ SOLN
INTRAMUSCULAR | Status: AC
  Filled 2024-08-15: qty 1

## 2024-08-15 MED ORDER — MIDAZOLAM HCL 2 MG/2ML IJ SOLN
INTRAMUSCULAR | Status: DC | PRN
  Administered 2024-08-15: 2 mg via INTRAVENOUS

## 2024-08-15 MED ORDER — MIDAZOLAM HCL 2 MG/2ML IJ SOLN
INTRAMUSCULAR | Status: AC
  Filled 2024-08-15: qty 2

## 2024-08-15 MED ORDER — PRAVASTATIN SODIUM 10 MG PO TABS
20.0000 mg | ORAL_TABLET | Freq: Every day | ORAL | Status: DC
  Administered 2024-08-15 – 2024-08-21 (×7): 20 mg via ORAL
  Filled 2024-08-15 (×8): qty 2

## 2024-08-15 MED ORDER — ACETAMINOPHEN 500 MG PO TABS
1000.0000 mg | ORAL_TABLET | Freq: Once | ORAL | Status: AC
  Administered 2024-08-15: 1000 mg via ORAL
  Filled 2024-08-15: qty 2

## 2024-08-15 MED ORDER — CEFAZOLIN SODIUM-DEXTROSE 2-4 GM/100ML-% IV SOLN
INTRAVENOUS | Status: AC
  Filled 2024-08-15: qty 100

## 2024-08-15 MED ORDER — SODIUM CHLORIDE 0.9 % IV SOLN: INTRAVENOUS | Status: DC

## 2024-08-15 MED ORDER — FENTANYL CITRATE (PF) 100 MCG/2ML IJ SOLN
25.0000 ug | INTRAMUSCULAR | Status: DC | PRN
  Administered 2024-08-15 (×2): 25 ug via INTRAVENOUS
  Administered 2024-08-15 (×2): 50 ug via INTRAVENOUS

## 2024-08-15 MED ORDER — METOPROLOL SUCCINATE ER 25 MG PO TB24: 25.0000 mg | ORAL_TABLET | Freq: Every day | ORAL | Status: DC | PRN

## 2024-08-15 MED ORDER — DROPERIDOL 2.5 MG/ML IJ SOLN: 0.6250 mg | Freq: Once | INTRAMUSCULAR | Status: DC | PRN

## 2024-08-15 MED ORDER — BSS IO SOLN
INTRAOCULAR | Status: AC
  Filled 2024-08-15: qty 15

## 2024-08-15 MED ORDER — PROPOFOL 10 MG/ML IV BOLUS
INTRAVENOUS | Status: DC | PRN
  Administered 2024-08-15: 100 mg via INTRAVENOUS
  Administered 2024-08-15: 50 mg via INTRAVENOUS

## 2024-08-15 MED ORDER — SUCCINYLCHOLINE CHLORIDE 200 MG/10ML IV SOSY
PREFILLED_SYRINGE | INTRAVENOUS | Status: AC
  Filled 2024-08-15: qty 10

## 2024-08-15 SURGICAL SUPPLY — 57 items
BAG COUNTER SPONGE SURGICOUNT (BAG) ×3 IMPLANT
BLADE SURG 11 STRL SS (BLADE) ×3 IMPLANT
BNDG COHESIVE 4X5 TAN STRL LF (GAUZE/BANDAGES/DRESSINGS) ×3 IMPLANT
BNDG COHESIVE 6X5 TAN NS LF (GAUZE/BANDAGES/DRESSINGS) ×3 IMPLANT
BNDG ELASTIC 4X5.8 VLCR STR LF (GAUZE/BANDAGES/DRESSINGS) IMPLANT
CANISTER SUCTION 3000ML PPV (SUCTIONS) ×3 IMPLANT
CATH ANGIO 5F BER2 65CM (CATHETERS) ×3 IMPLANT
CATH OMNI FLUSH .035X70CM (CATHETERS) IMPLANT
CLIP LIGATING EXTRA MED SLVR (CLIP) ×3 IMPLANT
CLIP LIGATING EXTRA SM BLUE (MISCELLANEOUS) ×3 IMPLANT
CNTNR URN SCR LID CUP LEK RST (MISCELLANEOUS) ×3 IMPLANT
COVER DOME SNAP 22 D (MISCELLANEOUS) ×3 IMPLANT
COVER PROBE W GEL 5X96 (DRAPES) ×3 IMPLANT
COVER SURGICAL LIGHT HANDLE (MISCELLANEOUS) ×3 IMPLANT
DERMABOND ADVANCED .7 DNX12 (GAUZE/BANDAGES/DRESSINGS) ×6 IMPLANT
DRAPE INCISE IOBAN 66X45 STRL (DRAPES) ×3 IMPLANT
DRAPE X-RAY CASS 24X20 (DRAPES) IMPLANT
ELECTRODE REM PT RTRN 9FT ADLT (ELECTROSURGICAL) IMPLANT
EVACUATOR SILICONE 100CC (DRAIN) IMPLANT
GLOVE BIO SURGEON STRL SZ7.5 (GLOVE) ×3 IMPLANT
GOWN STRL REUS W/ TWL LRG LVL3 (GOWN DISPOSABLE) ×6 IMPLANT
GOWN STRL REUS W/ TWL XL LVL3 (GOWN DISPOSABLE) ×3 IMPLANT
HEMOSTAT HEMOSTS OSTN BONE 2.5 (HEMOSTASIS) IMPLANT
HEMOSTAT SNOW SURGICEL 2X4 (HEMOSTASIS) IMPLANT
KIT BASIN OR (CUSTOM PROCEDURE TRAY) ×3 IMPLANT
KIT ENCORE 26 ADVANTAGE (KITS) IMPLANT
KIT TURNOVER KIT B (KITS) ×3 IMPLANT
NDL PERC 18GX7CM (NEEDLE) ×3 IMPLANT
NEEDLE PERC 18GX7CM (NEEDLE) ×2 IMPLANT
NS IRRIG 1000ML POUR BTL (IV SOLUTION) ×3 IMPLANT
PACK CV ACCESS (CUSTOM PROCEDURE TRAY) ×3 IMPLANT
PACK UNIVERSAL I (CUSTOM PROCEDURE TRAY) ×3 IMPLANT
PAD ARMBOARD POSITIONER FOAM (MISCELLANEOUS) ×6 IMPLANT
PADDING CAST ABS COTTON 6X4 NS (CAST SUPPLIES) IMPLANT
POWDER SURGICEL 3.0 GRAM (HEMOSTASIS) IMPLANT
SET COLLECT BLD 21X3/4 12 (NEEDLE) IMPLANT
SET MICROPUNCTURE 5F STIFF (MISCELLANEOUS) IMPLANT
SHEATH AVANTI 11CM 5FR (SHEATH) ×3 IMPLANT
SHEATH PINNACLE 6F 10CM (SHEATH) IMPLANT
SPONGE INTESTINAL PEANUT (DISPOSABLE) ×3 IMPLANT
STOCKINETTE 6 STRL (DRAPES) IMPLANT
STOCKINETTE IMPERVIOUS LG (DRAPES) ×3 IMPLANT
STOPCOCK 4 WAY LG BORE MALE ST (IV SETS) IMPLANT
STOPCOCK MORSE 400PSI 3WAY (MISCELLANEOUS) ×3 IMPLANT
SUT MNCRL AB 4-0 PS2 18 (SUTURE) ×3 IMPLANT
SUT PROLENE 5 0 C 1 24 (SUTURE) IMPLANT
SUT VIC AB 2-0 CT1 TAPERPNT 27 (SUTURE) ×3 IMPLANT
SUT VIC AB 3-0 SH 27X BRD (SUTURE) ×3 IMPLANT
SYR 10ML LL (SYRINGE) ×3 IMPLANT
SYR 20ML LL LF (SYRINGE) ×3 IMPLANT
SYR 30ML LL (SYRINGE) ×3 IMPLANT
TOWEL GREEN STERILE (TOWEL DISPOSABLE) ×6 IMPLANT
TUBE CONNECTING 20X1/4 (TUBING) IMPLANT
TUBING EXTENTION W/L.L. (IV SETS) IMPLANT
UNDERPAD 30X36 HEAVY ABSORB (UNDERPADS AND DIAPERS) ×3 IMPLANT
WATER STERILE IRR 1000ML POUR (IV SOLUTION) ×3 IMPLANT
WIRE BENTSON .035X145CM (WIRE) ×3 IMPLANT

## 2024-08-15 NOTE — Plan of Care (Signed)

## 2024-08-15 NOTE — Progress Notes (Signed)
 Orthopedic Tech Progress Note Patient Details:  [name] 05/02/1961 [id number]  Ortho Devices Type of Ortho Device: Arm sling Ortho Device/Splint Location: LUE Ortho Device/Splint Interventions: Ordered, Other (comment)PACU RN called requesting a MEDIUM ARM SLING, and told me to leave it at bedside   Post Interventions Patient Tolerated: Other (comment) Instructions Provided: Care of device  Delanna LITTIE Pac 08/15/2024, 1:10 PM

## 2024-08-15 NOTE — H&P (Signed)
 HPI:   [name] is a 63 y.o. female initially evaluated in our office for right upper extremity swelling.  She had a history of a trauma requiring over a month-long admission and at that time had a PICC line in place.  She has undergone balloon venoplasty of the right subclavian and axillary veins and since that time the swelling has resolved.  She continues to have right upper extremity numbness and discoloration and she also has chest pain that radiates into her neck and has been evaluated by cardiology and there is no evidence of coronary disease.  Most recently she has undergone scalene block and she states that after the block she was able to sleep for the first time without any neck and shoulder pain.  She states that the pain is returning as the block was placed 1 week ago and currently she is worried that is going to get back to her baseline which at times is excruciating.  She continues to have discoloration of the hand as well as pins-and-needles feelings but her swelling has resolved.       Past Medical History:  Diagnosis Date   Abdominal hernia 08/14/2019   Abdominal pain 06/06/2019   Acute bronchitis 05/01/2013   Acute cystitis 06/09/2010    Qualifier: Diagnosis of  By: Charlsie LPN, Brandi     Acute renal insufficiency 08/28/2011   Allergy      Phreesia 03/16/2021   Anemia      Phreesia 10/29/2020   Anxiety     Anxiety state 03/16/2008    Qualifier: Diagnosis of  By: Tita Lipps     Arthritis      Phreesia 10/29/2020   Back pain with radiation 07/17/2014   Bilateral hand pain 07/27/2016   Blood transfusion without reported diagnosis      Phreesia 10/29/2020   Breast cancer (HCC)      L breast- 2006   Cancer (HCC)      Phreesia 10/29/2020   Carpal tunnel syndrome     Chronic constipation     Chronic pain syndrome      followed by [facility]---  back   Clotting disorder (HCC)      Phreesia 10/29/2020   Depression     Dermatitis 12/15/2011    Difficult intubation     Educated about COVID-19 virus infection 05/14/2019   Fall at home 01/26/2016   Family history of adverse reaction to anesthesia      MOTHER--- PONV   Fatigue 09/17/2012   Fibromyalgia     FIBROMYALGIA 03/16/2008    Qualifier: Diagnosis of  By: Tita Lipps     GERD (gastroesophageal reflux disease)     Headache disorder 01/16/2013   Headache syndrome 01/26/2016   Hip pain, right 07/08/2019   History of MRSA infection      lip abscess   History of ovarian cyst 06/2011    s/p  BSO   History of pulmonary embolus (PE) 1997    post EP with ablation pulmonary veouns for SVT/ Atrial Fib.   History of supraventricular tachycardia      s/p  ablation 1996  and 1997  by dr fernande   History of TIA (transient ischemic attack) 1997    post op EP ablation PE   Hyperlipidemia     Hyperlipidemia LDL goal <100 03/16/2008    Qualifier: Diagnosis of  By: Bullins, Luann     Hypothyroidism      followed by pcp  Incisional hernia     Insomnia 12/15/2011   Intermittent palpitations 08/22/2017   Interstitial cystitis      09-13-2018   per pt last flare-up  May 2019 (followed by pcp)   INTERSTITIAL CYSTITIS 02/17/2011    Qualifier: Diagnosis of  By: Antonetta MD, Margaret     Iron  deficiency anemia      09-13-2018  PER PT STABLE   Irregular heart rate 07/25/2013   Lipoma of back      upper   Low back pain radiating to right leg 07/04/2019   Low ferritin level 06/14/2014   MDD (major depressive disorder), single episode, in full remission (HCC) 10/27/2017   Medically noncompliant 02/28/2015    Multiple missed appointments, both follow-up appointments and lab appointments.    Metabolic syndrome X 02/10/2011    Qualifier: Diagnosis of  By: Antonetta MD, Margaret  hBA1c is 5.8 in 02/2013    Migraines     Morbid obesity (HCC) 03/27/2013   Muscle spasm 01/03/2020   Nausea alone 07/17/2014   NECK PAIN, CHRONIC 03/16/2008    Qualifier: Diagnosis of  By: Tita Lipps      Normal coronary arteries      a. by CT 12/2018.   Obesity 03/16/2008    Qualifier: Diagnosis of  By: Tita Lipps     Oral ulceration 08/23/2014    Presented at 06/14/2014 visit    Other malaise and fatigue 03/16/2008    Centricity Description: FATIGUE, CHRONIC Qualifier: Diagnosis of  By: Tita Lipps   Centricity Description: FATIGUE Qualifier: Diagnosis of  By: Windell PA, Dawn     OVARIAN CYST 12/26/2009    Qualifier: Diagnosis of  By: Antonetta MD, Margaret     Paget disease of breast, left [facility])     Paget's disease of breast, left (HCC) 03/16/2008    Qualifier: Diagnosis of  By: Tita Lipps  Left diagnosed in 2006 F/h breast cancer x 15 family members   Partial small bowel obstruction (HCC) 07/04/2012   PONV (postoperative nausea and vomiting)      SEVERE   Postsurgical menopause 11/13/2011   Presence of IVC filter 06/06/2019   PSVT (paroxysmal supraventricular tachycardia) (HCC)      HX ABLATION 1996 AND 1997   Recurrent oral herpes simplex infection 10/13/2017   ROM (right otitis media) 01/23/2013   S/P insertion of IVC (inferior vena caval) filter 05/08/2005    [place] (non-retrievable)  /  dx 2019  a leg of filter is protruding thru the vena cava in to right L2 vertebral body (09-13-2018  per pt having surgery to remove filter in [place])   S/P radiofrequency ablation operation for arrhythmia 1996    1996  and 1997,   SVT and Atrial Fib   Sinusitis, chronic 01/26/2016   SMALL BOWEL OBSTRUCTION, HX OF 08/07/2008    Annotation: obstruction w/ adhesions led to partial colectomy Qualifier: Diagnosis of  By: Georgina Palma     Stroke [facility])      Phreesia 10/29/2020, TIAs in the past   Swelling of hand 09/17/2012   Syncope     Tachycardia 02/03/2010    Qualifier: Diagnosis of  By: Via LPN, Lynn     Thyroid  disease      Phreesia 10/29/2020   TOS (thoracic outlet syndrome)     Ulcer 12/15/2011   Urinary frequency 07/18/2014   Vaginitis and vulvovaginitis 05/10/2013    Vitamin D  deficiency 05/05/2015   Wears glasses  Family History  Problem Relation Age of Onset   Congestive Heart Failure Mother     Diabetes Mother     Heart disease Mother     Hypertension Mother     Other Mother          ketoacidosis   Heart disease Father     Hyperlipidemia Father     Hypertension Father     Alcohol abuse Father     Congestive Heart Failure Father     Supraventricular tachycardia Daughter     Colon cancer Maternal Aunt     Breast cancer Maternal Aunt     Breast cancer Maternal Aunt     Congestive Heart Failure Maternal Aunt     Cancer Maternal Aunt     Heart attack Maternal Aunt     Breast cancer Maternal Aunt     Cancer Maternal Aunt     Rheum arthritis Maternal Aunt     Breast cancer Maternal Aunt     Breast cancer Maternal Aunt     Cancer Maternal Uncle          mets   Prostate cancer Maternal Uncle     Bone cancer Maternal Grandfather          mets   Ovarian cancer Cousin 19   Breast cancer Cousin               Past Surgical History:  Procedure Laterality Date   ABDOMINAL HYSTERECTOMY   1987   ANTERIOR CERVICAL DECOMP/DISCECTOMY FUSION   03-07-2002   dr elsner  @[facility]     C 4 -- 5   APPENDECTOMY   1980   AUGMENTATION MAMMAPLASTY Right 2006   BILATERAL SALPINGOOPHORECTOMY   07/24/2011    via Explor. Lap. w/ intraoperative perf. bowel repair   BIOPSY   05/02/2021    Procedure: BIOPSY;  Surgeon: Eartha Flavors, Toribio, MD;  [facility];  Service: Gastroenterology;;  small bowel, mid esophagus, distal esophagus, random colon biopsies   BIOPSY   12/04/2021    Procedure: BIOPSY;  Surgeon: Cindie Carlin POUR, DO;  [facility];  Service: Endoscopy;;   BIOPSY   03/31/2022    Procedure: BIOPSY;  Surgeon: Eartha Flavors Toribio, MD;  [facility];  Service: Gastroenterology;;   BOTOX  INJECTION N/A 03/31/2022    Procedure: BOTOX  INJECTION;  Surgeon: Eartha Flavors Toribio, MD;  [facility];  Service: Gastroenterology;  Laterality: N/A;   BREAST BIOPSY Right 2019    benign   BREAST ENHANCEMENT SURGERY Bilateral 1993   BREAST IMPLANT REMOVAL Bilateral     BREAST SURGERY N/A      Phreesia 10/29/2020   CARDIAC CATHETERIZATION   07-06-2003   dr wolm brodie    normal coronaries and LVF   CARDIAC ELECTROPHYSIOLOGY STUDY AND ABLATION   1996  and 1997   CARDIOVASCULAR STRESS TEST   11-18-2015   dr fernande    normal nuclear study w/ no ischemia/  normal LV function and wall motion , ef 84%   CARPAL TUNNEL RELEASE Right ?   CESAREAN SECTION   1986   CESAREAN SECTION N/A      Phreesia 10/29/2020   CHOLECYSTECTOMY N/A      Phreesia 10/29/2020   COLON SURGERY       COLONOSCOPY WITH PROPOFOL  N/A 05/02/2021    Procedure: COLONOSCOPY WITH PROPOFOL ;  Surgeon: Eartha Flavors Toribio, MD;  [facility];  Service: Gastroenterology;  Laterality: N/A;  12:30  PM   CYSTO/  HYDRODISTENTION/  INSTILATION THERAPY   MULTIPLE   ENTEROCUTANEOUS FISTULA CLOSURE   multiple    last one 2015 with small bowel resection   ESOPHAGOGASTRODUODENOSCOPY (EGD) WITH PROPOFOL  N/A 05/02/2021    Procedure: ESOPHAGOGASTRODUODENOSCOPY (EGD) WITH PROPOFOL ;  Surgeon: Eartha Angelia Sieving, MD;  [facility];  Service: Gastroenterology;  Laterality: N/A;   ESOPHAGOGASTRODUODENOSCOPY (EGD) WITH PROPOFOL  N/A 12/04/2021    Procedure: ESOPHAGOGASTRODUODENOSCOPY (EGD) WITH PROPOFOL ;  Surgeon: Cindie Carlin POUR, DO;  [facility];  Service: Endoscopy;  Laterality: N/A;   ESOPHAGOGASTRODUODENOSCOPY (EGD) WITH PROPOFOL  N/A 03/31/2022    Procedure: ESOPHAGOGASTRODUODENOSCOPY (EGD) WITH PROPOFOL ;  Surgeon: Eartha Angelia Sieving, MD;  [facility];  Service: Gastroenterology;  Laterality: N/A;  1015   EXPLORATORY LAPAROTOMY INCISIONAL VENTRAL HERNIA REPAIR / RESECTION SMALL BOWEL   11-09-2014   @[facility]    FRACTURE SURGERY N/A      Phreesia 10/29/2020   JOINT REPLACEMENT N/A       Phreesia 10/29/2020   LIPOMA EXCISION Right 09/16/2018    Procedure: EXCISION LIPOMA UPPER BACK;  Surgeon: Signe Mitzie LABOR, MD;  Location: [facility];  Service: General;  Laterality: Right;   MASTECTOMY Left 2006    w/ reconstruction on left (paget's disease)  and right breast augmentation   PERIPHERAL VASCULAR BALLOON ANGIOPLASTY Right 09/13/2023    Procedure: PERIPHERAL VASCULAR BALLOON ANGIOPLASTY;  Surgeon: Sheree Penne Bruckner, MD;  Location: [facility] INVASIVE CV LAB;  Service: Cardiovascular;  Laterality: Right;  Subclavian and axillary   POLYPECTOMY   05/02/2021    Procedure: POLYPECTOMY;  Surgeon: Eartha Angelia Sieving, MD;  [facility];  Service: Gastroenterology;;  gastric   POLYPECTOMY   03/31/2022    Procedure: POLYPECTOMY;  Surgeon: Eartha Angelia Sieving, MD;  [facility];  Service: Gastroenterology;;   HARLEY DILATION   05/02/2021    Procedure: HARLEY DILATION;  Surgeon: Eartha Angelia Sieving, MD;  [facility];  Service: Gastroenterology;;   SMALL INTESTINE SURGERY N/A      Phreesia 10/29/2020   SPINE SURGERY N/A      Phreesia 10/29/2020   TOTAL COLECTOMY   08-04-2002    @[facility]     AND CHOLECYSTECTOMY  (colonic inertia)   TRANSTHORACIC ECHOCARDIOGRAM   02/04/2016    ef 60-65%,  grade 2 diastolic dysfunction/  mild MR   TUBAL LIGATION N/A      Phreesia 03/16/2021   UPPER EXTREMITY VENOGRAPHY N/A 09/13/2023    Procedure: UPPER EXTREMITY VENOGRAPHY;  Surgeon: Sheree Penne Bruckner, MD;  Location: [facility] INVASIVE CV LAB;  Service: Cardiovascular;  Laterality: N/A;   VENA CAVA FILTER PLACEMENT   05/08/2005   @[facility]     [facility] (non-retrievable)   WIDE EXCISION PERIRECTAL ABSCESSES   09-22-2005   @ [facility]          Short Social History:  Social History        Tobacco Use   Smoking status: Never   Smokeless tobacco: Never  Substance Use Topics   Alcohol use: No      Allergies       Allergies  Allergen Reactions    Nitrofurantoin Hives   Crestor  [Rosuvastatin  Calcium ] Other (See Comments)      Generalized cramps   Bisacodyl  Other (See Comments)      Makes patient feel like she is having cramps     Codeine Itching   Iron  Sucrose Other (See Comments)      Flushing- required benadryl  and  solu medrol    Monosodium Glutamate Other (See Comments)      Cluster migraines     Scopolamine Hbr Other (See Comments)      Cluster migraines, impaired vision   Morphine  Rash   Other Hives, Itching, Swelling, Rash and Other (See Comments)      Cigarette smoke Causes Migraines               Current Outpatient Medications  Medication Sig Dispense Refill   acyclovir  (ZOVIRAX ) 800 MG tablet Take 1 tablet (800 mg total) by mouth 5 (five) times daily. 50 tablet 1   ALPRAZolam  (XANAX ) 1 MG tablet Take 1 tablet (1 mg total) by mouth 2 (two) times daily as needed for anxiety. (Patient taking differently: Take 1 mg by mouth 2 (two) times daily.) 60 tablet 5   Ascorbic Acid  (VITAMIN C) 250 MG CHEW Chew 500 mg by mouth daily.       aspirin  81 MG chewable tablet Chew 81 mg by mouth daily.       butalbital -acetaminophen -caffeine  (FIORICET) 50-325-40 MG tablet Take 1 tablet by mouth every 6 (six) hours as needed for headache.       conjugated estrogens  (PREMARIN ) vaginal cream INSERT pea sized amount three times weekly intravaginally 30 g 2   cyanocobalamin  (VITAMIN B12) 1000 MCG/ML injection ADMINISTER 1 ML(1000 MCG) IN THE MUSCLE EVERY 30 DAYS 1 mL 11   cyclobenzaprine  (FLEXERIL ) 10 MG tablet Take 1 tablet (10 mg total) by mouth 2 (two) times daily as needed for muscle spasms. (Patient taking differently: Take 10 mg by mouth 2 (two) times daily.) 90 tablet 3   cycloSPORINE  (RESTASIS ) 0.05 % ophthalmic emulsion Place 1 drop into both eyes 2 (two) times daily as needed (dry eyes).       DULoxetine  (CYMBALTA ) 60 MG capsule Take 1 capsule (60 mg total) by mouth daily. 30 capsule 3   ezetimibe  (ZETIA ) 10 MG tablet TAKE 1  TABLET(10 MG) BY MOUTH DAILY (Patient taking differently: Take 10 mg by mouth at bedtime.) 90 tablet 3   fenofibrate  (TRICOR ) 48 MG tablet TAKE 1 TABLET(48 MG) BY MOUTH DAILY (Patient taking differently: Take 48 mg by mouth at bedtime.) 30 tablet 5   furosemide  (LASIX ) 20 MG tablet TAKE 1 TABLET(20 MG) BY MOUTH DAILY 30 tablet 3   HYDROmorphone  (DILAUDID ) 4 MG tablet Take 4 mg by mouth 3 (three) times daily.       INTRAROSA 6.5 MG INST Place 1 suppository vaginally at bedtime.       Lactobacillus (CULTURELLE DIGESTIVE WOMENS PO) Take 1 capsule by mouth daily.       levothyroxine  (SYNTHROID ) 125 MCG tablet TAKE 1 TABLET(125 MCG) BY MOUTH DAILY BEFORE BREAKFAST 90 tablet 1   linaclotide  (LINZESS ) 290 MCG CAPS capsule Take 1 capsule (290 mcg total) by mouth daily before breakfast. 30 capsule 11   magnesium  oxide (MAG-OX) 400 (240 Mg) MG tablet TAKE 1 TABLET BY MOUTH FOUR TIMES DAILY 180 tablet 0   MegaRed Omega-3 Krill Oil 500 MG CAPS Take 500 mg by mouth at bedtime.       Melatonin 10 MG TABS Take 10 mg by mouth at bedtime.       metFORMIN  (GLUCOPHAGE -XR) 500 MG 24 hr tablet Take 500 mg by mouth 2 (two) times daily with a meal.       metoprolol  succinate (TOPROL -XL) 25 MG 24 hr tablet TAKE 1 TABLET(25 MG) BY MOUTH DAILY (Patient taking differently: Take 25 mg by mouth 2 (two) times  daily as needed (heart racing).) 90 tablet 2   Multiple Vitamins-Minerals (ICAPS AREDS 2 PO) Take 1 capsule by mouth at bedtime.       naloxone (NARCAN) nasal spray 4 mg/0.1 mL Place 1 spray into the nose as needed (opioid reversal).       omeprazole  (PRILOSEC) 40 MG capsule TAKE 1 CAPSULE(40 MG) BY MOUTH IN THE MORNING AND AT BEDTIME 180 capsule 3   OVER THE COUNTER MEDICATION Take 2 tablets by mouth 2 (two) times daily. Nutra-ful       OVER THE COUNTER MEDICATION Take 1 capsule by mouth at bedtime. Blink Eye Supplement for Dry eyes       phenazopyridine  (PYRIDIUM ) 95 MG tablet Take by mouth 3 (three) times daily as  needed. sd       Polyethyl Glycol-Propyl Glycol (SYSTANE ULTRA) 0.4-0.3 % SOLN Place 1 drop into both eyes as needed (dry eyes).       polyethylene glycol (MIRALAX  / GLYCOLAX ) 17 g packet Take 17 g by mouth daily as needed for mild constipation or moderate constipation.       potassium chloride  (KLOR-CON  M) 10 MEQ tablet TAKE 1 TABLET(10 MEQ) BY MOUTH TWICE DAILY 180 tablet 3   pravastatin  (PRAVACHOL ) 20 MG tablet TAKE 1 TABLET(20 MG) BY MOUTH DAILY (Patient taking differently: Take 20 mg by mouth at bedtime.) 90 tablet 1   promethazine  (PHENERGAN ) 12.5 MG tablet Take 12.5 mg by mouth every 6 (six) hours as needed for nausea or vomiting.       sennosides-docusate sodium  (SENOKOT-S) 8.6-50 MG tablet Take 2 tablets by mouth at bedtime.       Vitamin D , Ergocalciferol , (DRISDOL ) 1.25 MG (50000 UNIT) CAPS capsule Take 50,000 Units by mouth every 30 (thirty) days.       XARELTO  20 MG TABS tablet TAKE 1 TABLET(20 MG) BY MOUTH DAILY 30 tablet 3               Current Facility-Administered Medications  Medication Dose Route Frequency Provider Last Rate Last Admin   methylPREDNISolone  acetate (DEPO-MEDROL ) injection 40 mg  40 mg Intramuscular Once              Review of Systems  Constitutional:  Constitutional negative. HENT: HENT negative.  Eyes: Eyes negative.  Respiratory: Respiratory negative.  Cardiovascular: Positive for chest pain.  GI: Gastrointestinal negative.  Musculoskeletal:       Neck pain Skin: Skin negative.  Neurological: Positive for focal weakness and numbness.  Hematologic: Hematologic/lymphatic negative.  Psychiatric: Psychiatric negative.          Objective:   Vitals:   08/15/24 0548  BP: 128/73  Pulse: 74  Resp: 17  Temp: 97.7 F (36.5 C)  SpO2: 97%    Physical Exam HENT:     Head: Normocephalic.     Nose: Nose normal.     Mouth/Throat:     Mouth: Mucous membranes are moist.  Eyes:     Pupils: Pupils are equal, round, and reactive to light.  Neck:      Comments: Right supraclavicular tenderness to palpation Cardiovascular:     Rate and Rhythm: Normal rate.     Pulses:          Radial pulses are 2+ on the right side and 2+ on the left side.  Abdominal:     General: Abdomen is flat.  Musculoskeletal:        General: Normal range of motion.     Cervical back: Normal range  of motion.     Right lower leg: No edema.     Left lower leg: No edema.  Skin:    General: Skin is warm.     Capillary Refill: Capillary refill takes less than 2 seconds.  Neurological:     General: No focal deficit present.     Mental Status: She is alert.  Psychiatric:        Mood and Affect: Mood normal.        Thought Content: Thought content normal.        Judgment: Judgment normal.        Data: No new studies      Assessment/Plan:    63 year old female with evidence of multifactorial right upper extremity pain likely secondary to previous trauma including cervical spine disease, possible complex regional pain syndrome and 1 component is neurogenic and possible venous thoracic outlet syndrome.  She has undergone balloon venoplasty of her central veins with much improvement in swelling and now has undergone scalene block with significant improvement in her symptoms particularly in the right neck and shoulder.  We have discussed that she is reasonable to proceed with right first rib resection given that she had a dramatically positive response to scalene block.  We did discuss that first rib resection would only treat 1 component of her pain and that she is unlikely to ever be pain-free or have a fully functional right upper extremity. Plan for R transaxillary 1st rib resection with venogram today in the OR.      Penne Lonni Colorado MD [facility]
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
## 2024-08-15 NOTE — Transfer of Care (Signed)
 Immediate Anesthesia Transfer of Care Note  Patient: [name]  Procedure(s) Performed: EXCISION, RIB (Right) VENOGRAM (Arm Upper)  Patient Location: PACU  Anesthesia Type:General  Level of Consciousness: drowsy  Airway & Oxygen Therapy: Patient Spontanous Breathing and Patient connected to face mask oxygen  Post-op Assessment: Report given to RN and Post -op Vital signs reviewed and stable  Post vital signs: Reviewed and stable  Last Vitals:  Vitals Value Taken Time  BP 135/66 08/15/24 10:22  Temp 36.4 C 08/15/24 10:22  Pulse 89 08/15/24 10:25  Resp 16 08/15/24 10:25  SpO2 98 % 08/15/24 10:25  Vitals shown include unfiled device data.  Last Pain:  Vitals:   08/15/24 0640  TempSrc:   PainSc: 7       Patients Stated Pain Goal: 2 (08/15/24 0640)  Complications: There were no known notable events for this encounter.

## 2024-08-15 NOTE — Anesthesia Procedure Notes (Addendum)
 Procedure Name: Intubation Date/Time: 08/15/2024 7:56 AM  Performed by: Arvell Edsel HERO, CRNAPre-anesthesia Checklist: Patient identified, Emergency Drugs available, Suction available and Patient being monitored Patient Re-evaluated:Patient Re-evaluated prior to induction Oxygen Delivery Method: Circle system utilized Preoxygenation: Pre-oxygenation with 100% oxygen Induction Type: IV induction Ventilation: Mask ventilation without difficulty Laryngoscope Size: Glidescope and 4 Grade View: Grade I Tube type: Oral Tube size: 7.0 mm Number of attempts: 1 Airway Equipment and Method: Stylet and Oral airway Placement Confirmation: ETT inserted through vocal cords under direct vision, positive ETCO2 and breath sounds checked- equal and bilateral Secured at: 21 cm Tube secured with: Tape Dental Injury: Teeth and Oropharynx as per pre-operative assessment

## 2024-08-15 NOTE — Anesthesia Postprocedure Evaluation (Signed)
 Anesthesia Post Note  Patient: [name]  Procedure(s) Performed: EXCISION, RIB (Right) VENOGRAM (Arm Upper)     Patient location during evaluation: PACU Anesthesia Type: General Level of consciousness: awake and alert Pain management: pain level controlled Vital Signs Assessment: post-procedure vital signs reviewed and stable Respiratory status: spontaneous breathing, nonlabored ventilation, respiratory function stable and patient connected to nasal cannula oxygen Cardiovascular status: blood pressure returned to baseline and stable Postop Assessment: no apparent nausea or vomiting Anesthetic complications: no Comments: Patient reports that she had a corneal abrasion on the right eye from a week ago. Endorses some irritation in that same eye. Toradol  eye drops prescribed.    There were no known notable events for this encounter.  Last Vitals:  Vitals:   08/15/24 1200 08/15/24 1215  BP: 111/63 101/66  Pulse: 80 85  Resp: 18 15  Temp:  36.6 C  SpO2: 94% 94%    Last Pain:  Vitals:   08/15/24 1215  TempSrc:   PainSc: Asleep                 Thom JONELLE Peoples

## 2024-08-16 ENCOUNTER — Encounter (HOSPITAL_COMMUNITY): Payer: Self-pay | Admitting: Vascular Surgery

## 2024-08-16 LAB — BASIC METABOLIC PANEL WITH GFR
Anion gap: 15 (ref 5–15)
BUN: 21 mg/dL (ref 8–23)
CO2: 22 mmol/L (ref 22–32)
Calcium: 9 mg/dL (ref 8.9–10.3)
Chloride: 103 mmol/L (ref 98–111)
Creatinine, Ser: 0.97 mg/dL (ref 0.44–1.00)
GFR, Estimated: >60 mL/min (ref >60)
Glucose, Bld: 110 mg/dL — ABNORMAL HIGH (ref 70–99)
Potassium: 4.3 mmol/L (ref 3.5–5.1)
Sodium: 140 mmol/L (ref 135–145)

## 2024-08-16 LAB — CBC
HCT: 37.7 % (ref 36.0–46.0)
Hemoglobin: 11.8 g/dL — ABNORMAL LOW (ref 12.0–15.0)
MCH: 29.9 pg (ref 26.0–34.0)
MCHC: 31.3 g/dL (ref 30.0–36.0)
MCV: 95.7 fL (ref 80.0–100.0)
Platelets: 186 K/uL (ref 150–400)
RBC: 3.94 MIL/uL (ref 3.87–5.11)
RDW: 13.9 % (ref 11.5–15.5)
WBC: 7 K/uL (ref 4.0–10.5)
nRBC: 0 % (ref 0.0–0.2)

## 2024-08-16 MED ORDER — RIVAROXABAN 20 MG PO TABS
20.0000 mg | ORAL_TABLET | Freq: Every day | ORAL | Status: DC
  Administered 2024-08-16 – 2024-08-21 (×6): 20 mg via ORAL
  Filled 2024-08-16 (×6): qty 1

## 2024-08-16 NOTE — Progress Notes (Addendum)
 Progress Note    08/16/2024 8:28 AM 1 Day Post-Op  Subjective: Says her pain in her right side and back was bad overnight.  Current pain medications are helping   Vitals:   08/16/24 0700 08/16/24 0817  BP: (!) 92/52 (!) 109/55  Pulse: 78 87  Resp: 17 16  Temp:  98.1 F (36.7 C)  SpO2: 97% 100%    Physical Exam: General: Alert and oriented x 3 Cardiac: Regular Lungs: Nonlabored Incisions: Right axilla incision intact and dry.  Some bruising in the axilla going into the back, however this is soft Extremities: Palpable right radial pulse.  Intact motor and sensation of right upper extremity  CBC    Component Value Date/Time   WBC 13.2 (H) 08/15/2024 1639   RBC 3.59 (L) 08/15/2024 1639   HGB 11.0 (L) 08/15/2024 1639   HGB 14.3 05/05/2024 1401   HCT 33.7 (L) 08/15/2024 1639   HCT 42.8 05/05/2024 1401   PLT 209 08/15/2024 1639   PLT 211 05/05/2024 1401   MCV 93.9 08/15/2024 1639   MCV 92 05/05/2024 1401   MCH 30.6 08/15/2024 1639   MCHC 32.6 08/15/2024 1639   RDW 13.8 08/15/2024 1639   RDW 13.5 05/05/2024 1401   LYMPHSABS 1.6 05/23/2024 1250   LYMPHSABS 2.1 05/05/2024 1401   MONOABS 0.5 05/23/2024 1250   EOSABS 0.2 05/23/2024 1250   EOSABS 0.1 05/05/2024 1401   BASOSABS 0.0 05/23/2024 1250   BASOSABS 0.1 05/05/2024 1401    BMET    Component Value Date/Time   NA 140 08/15/2024 1639   NA 141 07/19/2024 1644   K 4.7 08/15/2024 1639   CL 108 08/15/2024 1639   CO2 20 (L) 08/15/2024 1639   GLUCOSE 180 (H) 08/15/2024 1639   BUN 18 08/15/2024 1639   BUN 16 07/19/2024 1644   CREATININE 1.22 (H) 08/15/2024 1639   CREATININE 0.76 02/25/2015 1432   CALCIUM  8.7 (L) 08/15/2024 1639   GFRNONAA 50 (L) 08/15/2024 1639   GFRNONAA 67 06/12/2014 1327   GFRAA 84 01/15/2021 1540   GFRAA 77 06/12/2014 1327    INR    Component Value Date/Time   INR 1.0 08/15/2024 1639     Intake/Output Summary (Last 24 hours) at 08/16/2024 0828 Last data filed at 08/15/2024  1727 Gross per 24 hour  Intake 1400.26 ml  Output 200 ml  Net 1200.26 ml      Assessment/Plan:  63 y.o. female is 1 day postop, s/p: right first rib resection   -She is doing okay this morning but having continued pain at her incision site going into her back -Current pain medication regime is very helpful for the patient, we will continue -Right axillary incision intact without hematoma.  Some bruising at the incision site going into the back -Intact motor and sensation of right upper extremity.  Right arm well-perfused with 2+ radial pulse -Morning labs pending.  Will restart Xarelto  today if CBC is stable. -Pending mobility today with PT/OT.  Possibly home tomorrow if pain is improved   Ahmed Holster, PA-C [facility] [phone] 08/16/2024 8:28 AM    I have independently interviewed and examined patient and agree with PA assessment and plan above.  She is having significant pain in the right upper extremity today although the arm is warm and well-perfused with 2+ pulse, there is no edema and she is sensorimotor intact.  Will mobilize today have PT and OT evaluate and work on pain control prior to consideration of discharge.  Nathasha Fiorillo C. Sheree, MD [facility] [place] Office: [phone] Pager: [phone]

## 2024-08-16 NOTE — Evaluation (Signed)
 Physical Therapy Evaluation Patient Details Name: [name] MRN: [id number] DOB: 09-03-1961 Today's Date: 08/16/2024  History of Present Illness  Pt is a 63 y.o female admitted 8/19 for RUE pain likely d/t complex regional pain syndrome and 1 component is neurogenic and possible venous thoracic outlet syndrome. S/p R transaxillary 1st rib resection 8/19.  Clinical Impression  Pt is presenting below baseline level of functioning for use of RUE. Pt currently is Ind for sit to stand and gait without an AD. Recommending to follow physician recommendation for physical therapy once discharged from acute care hospital setting.           If plan is discharge home, recommend the following: Help with stairs or ramp for entrance;Assistance with cooking/housework;Assist for transportation     Equipment Recommendations None recommended by PT     Functional Status Assessment Patient has had a recent decline in their functional status and demonstrates the ability to make significant improvements in function in a reasonable and predictable amount of time.     Precautions / Restrictions Precautions Precautions: None Recall of Precautions/Restrictions: Intact Required Braces or Orthoses: Sling Restrictions Weight Bearing Restrictions Per Provider Order: No Other Position/Activity Restrictions: RUE sling for comfort      Mobility  Bed Mobility   General bed mobility comments: Pt in recliner at beginning and end of session    Transfers Overall transfer level: Independent Equipment used: None Transfers: Sit to/from Stand             General transfer comment: No deficits noted.    Ambulation/Gait Ambulation/Gait assistance: Independent Gait Distance (Feet): 400 Feet Assistive device: None Gait Pattern/deviations: WFL(Within Functional Limits)   Gait velocity interpretation: >4.37 ft/sec, indicative of normal walking speed   General Gait Details: Pt in sling; increased RUE  pain with distance.  Stairs Stairs:  (Pt has one step, states spouse can assist and has post on L. Demonstrates adequate strength with sit to stand and gait to perform steps per home set up)            Balance Overall balance assessment: Independent, No apparent balance deficits (not formally assessed)         Pertinent Vitals/Pain Pain Assessment Pain Assessment: Faces Faces Pain Scale: Hurts even more Pain Location: RUE Pain Descriptors / Indicators: Discomfort, Grimacing Pain Intervention(s): Monitored during session, Limited activity within patient's tolerance    Home Living Family/patient expects to be discharged to:: Private residence Living Arrangements: Spouse/significant other Available Help at Discharge: Family;Available 24 hours/day Type of Home: House Home Access: Stairs to enter Entrance Stairs-Rails: None (has door posts she can hold onto and spouse can assist with stairs.) Entrance Stairs-Number of Steps: 1   Home Layout: One level Home Equipment: Agricultural consultant (2 wheels);BSC/3in1;Shower seat;Wheelchair - manual;Cane - single point      Prior Function Prior Level of Function : Independent/Modified Independent             Mobility Comments: No AD ADLs Comments: Ind, reports frequently dropping items d/t decreased sensation     Extremity/Trunk Assessment   Upper Extremity Assessment Upper Extremity Assessment: Defer to OT evaluation RUE Deficits / Details: S/p R transaxillary 1st rib resection 8/19,  venous thoracic outlet syndrome RUE: Shoulder pain with ROM RUE Sensation: decreased light touch RUE Coordination: decreased fine motor    Lower Extremity Assessment Lower Extremity Assessment: Overall WFL for tasks assessed    Cervical / Trunk Assessment Cervical / Trunk Assessment: Normal  Communication   Communication  Communication: No apparent difficulties    Cognition Arousal: Alert Behavior During Therapy: WFL for tasks  assessed/performed   PT - Cognitive impairments: No apparent impairments     Following commands: Intact       Cueing Cueing Techniques: Verbal cues     General Comments General comments (skin integrity, edema, etc.): VSS on RA        Assessment/Plan    PT Assessment All further PT needs can be met in the next venue of care  PT Problem List Decreased mobility;Pain;Decreased range of motion;Decreased strength;Impaired sensation           PT Goals (Current goals can be found in the Care Plan section)  Acute Rehab PT Goals Patient Stated Goal: improve R UE mobility PT Goal Formulation: All assessment and education complete, DC therapy            AM-PAC PT 6 Clicks Mobility  Outcome Measure Help needed turning from your back to your side while in a flat bed without using bedrails?: A Little Help needed moving from lying on your back to sitting on the side of a flat bed without using bedrails?: A Little Help needed moving to and from a bed to a chair (including a wheelchair)?: None Help needed standing up from a chair using your arms (e.g., wheelchair or bedside chair)?: None Help needed to walk in hospital room?: None Help needed climbing 3-5 steps with a railing? : A Little 6 Click Score: 21    End of Session Equipment Utilized During Treatment: Other (comment) (sling) Activity Tolerance: Patient tolerated treatment well Patient left: in chair;with call bell/phone within reach Nurse Communication: Mobility status      Time: 8949-8880 PT Time Calculation (min) (ACUTE ONLY): 29 min   Charges:   PT Evaluation $PT Eval Low Complexity: 1 Low PT Treatments $Therapeutic Activity: 8-22 mins PT General Charges $$ ACUTE PT VISIT: 1 Visit        Dorothyann Maier, DPT, CLT  Acute Rehabilitation Services Office: [phone] (Secure chat preferred)   Dorothyann VEAR Maier 08/16/2024, 11:26 AM

## 2024-08-16 NOTE — Discharge Instructions (Addendum)
   [facility]  Discharge Instructions   Rib Excision Surgery  Please refer to the following instructions for your post-procedure care. Your surgeon or physician assistant will discuss any changes with you.  Activity  You are encouraged to walk as much as you can. You can slowly return to normal activities but must avoid strenuous activity and heavy lifting until your doctor tell you it's okay. Avoid activities such as vacuuming or swinging a golf club. You can drive after one week if you are comfortable and you are no longer taking prescription pain medications. It is normal to feel tired for several weeks after your surgery. It is also normal to have difficulty with sleep habits, eating, and bowel movements after surgery. These will go away with time.  Bathing/Showering  Shower daily after you go home. Do not soak in a bathtub, hot tub, or swim until the incision heals completely.  Incision Care  Shower every day with soap and water . Clean your incision with mild soap and water . Pat the area dry with a clean towel. Tuck dry gauze over incision to wick moisture and help keep dry to help prevent yeast or infection. Do not apply any ointments or creams to your incision. You may have skin glue on your incision. Do not peel it off. It will come off on its own in about one week. Your incision may feel thickened and raised for several weeks after your surgery. This is normal and the skin will soften over time.   Diet  Resume your normal diet. There are no special food restrictions following this procedure. A low fat/low cholesterol diet is recommended for all patients with vascular disease. In order to heal from your surgery, it is CRITICAL to get adequate nutrition. Your body requires vitamins, minerals, and protein. Vegetables are the best source of vitamins and minerals. Vegetables also provide the perfect balance of protein. Processed food has little nutritional  value, so try to avoid this.  Medications  Resume taking all of your medications unless your doctor or physician assistant tells you not to. If your incision is causing pain, you may take over-the- counter pain relievers such as acetaminophen  (Tylenol ). If you were prescribed a stronger pain medication, please be aware these medications can cause nausea and constipation. Prevent nausea by taking the medication with a snack or meal. Avoid constipation by drinking plenty of fluids and eating foods with a high amount of fiber, such as fruits, vegetables, and grains.  Do not take Tylenol  if you are taking prescription pain medications.  Follow Up  Our office will schedule a follow up appointment 2-3 weeks following discharge.  Please call us  immediately for any of the following conditions  Increased pain, redness, drainage (pus) from your incision site. Fever of 101 degrees or higher.  Reduce your risk of vascular disease:  Stop smoking. If you would like help call [facility] at 1-800-QUIT-NOW (206-776-0417) or [place] at 8583933209. Manage your cholesterol Maintain a desired weight Control your diabetes Keep your blood pressure down  If you have any questions, please call the office at [phone].

## 2024-08-16 NOTE — Evaluation (Signed)
 Occupational Therapy Evaluation Patient Details Name: [name] MRN: [id number] DOB: [birth year] Today's Date: 08/16/2024   History of Present Illness   Pt is a 63 y.o female admitted 8/19 for RUE pain likely d/t complex regional pain syndrome and 1 component is neurogenic and possible venous thoracic outlet syndrome. S/p R transaxillary 1st rib resection 8/19.     Clinical Impressions Pt admitted based on above, and was seen based on problem list below. PTA pt was independent with ADLs and IADLs. Today pt is requiring s for safety to min  assist for ADLs. Bed mobility and functional transfers are  s for safety. Educated pt on use of compensatory strategies for ADLs and how to don/doff sling. Pt reporting hx of decreased sensation, and decreased grip strength impacting daily activities. Pt would benefit from outpatient OT services to address deficits. OT will continue to follow acutely to maximize functional independence.        If plan is discharge home, recommend the following:   A little help with bathing/dressing/bathroom;Assistance with cooking/housework     Functional Status Assessment   Patient has had a recent decline in their functional status and demonstrates the ability to make significant improvements in function in a reasonable and predictable amount of time.     Equipment Recommendations   None recommended by OT      Precautions/Restrictions   Precautions Precautions: Fall Recall of Precautions/Restrictions: Intact Required Braces or Orthoses: Sling Restrictions Weight Bearing Restrictions Per Provider Order: No Other Position/Activity Restrictions: RUE sling for comfort     Mobility Bed Mobility Overal bed mobility: Needs Assistance Bed Mobility: Supine to Sit     Supine to sit: Contact guard, HOB elevated     General bed mobility comments: HOB elevated, increased time, heavy reliance on bed rails    Transfers Overall transfer  level: Needs assistance Equipment used: None Transfers: Sit to/from Stand, Bed to chair/wheelchair/BSC Sit to Stand: Supervision     Step pivot transfers: Supervision     General transfer comment: S for safety, no AD, mild unsteadyness      Balance Overall balance assessment: Mild deficits observed, not formally tested       ADL either performed or assessed with clinical judgement   ADL Overall ADL's : Needs assistance/impaired Eating/Feeding: Set up;Sitting   Grooming: Wash/dry hands;Supervision/safety;Standing     Upper Body Dressing : Minimal assistance;Sitting;Cueing for UE precautions;Cueing for compensatory techniques Upper Body Dressing Details (indicate cue type and reason): Min assist to don sling, cueing for compensatory technique Lower Body Dressing: Set up;Sit to/from stand   Toilet Transfer: Retail banker;Ambulation Toilet Transfer Details (indicate cue type and reason): S for safety, no AD Toileting- Clothing Manipulation and Hygiene: Supervision/safety;Sit to/from stand       Functional mobility during ADLs: Supervision/safety General ADL Comments: S for safety, no AD     Vision Baseline Vision/History: 1 Wears glasses Vision Assessment?: No apparent visual deficits            Pertinent Vitals/Pain Pain Assessment Pain Assessment: 0-10 Pain Score: 8  Pain Location: RUE Pain Descriptors / Indicators: Discomfort, Grimacing Pain Intervention(s): Premedicated before session     Extremity/Trunk Assessment Upper Extremity Assessment Upper Extremity Assessment: Right hand dominant;RUE deficits/detail RUE Deficits / Details: S/p R transaxillary 1st rib resection 8/19,  venous thoracic outlet syndrome RUE: Shoulder pain with ROM RUE Sensation: decreased light touch RUE Coordination: decreased fine motor   Lower Extremity Assessment Lower Extremity Assessment: Defer to PT evaluation  Cervical / Trunk Assessment Cervical /  Trunk Assessment: Normal   Communication Communication Communication: No apparent difficulties   Cognition Arousal: Alert Behavior During Therapy: WFL for tasks assessed/performed Cognition: No apparent impairments     Following commands: Intact       Cueing  General Comments   Cueing Techniques: Verbal cues  VSS on RA           Home Living Family/patient expects to be discharged to:: Private residence Living Arrangements: Spouse/significant other Available Help at Discharge: Family;Available 24 hours/day Type of Home: House Home Access: Stairs to enter [employer] of Steps: 1 Entrance Stairs-Rails: None Home Layout: One level     Bathroom Shower/Tub: Tub/shower unit;Walk-in shower   Bathroom Toilet: Handicapped height Bathroom Accessibility: Yes How Accessible: Accessible via walker Home Equipment: Rolling Walker (2 wheels);BSC/3in1;Shower seat;Wheelchair - manual;Cane - single point          Prior Functioning/Environment Prior Level of Function : Independent/Modified Independent     Mobility Comments: No AD ADLs Comments: Ind, reports frequently dropping items d/t decreased sensation    OT Problem List: Decreased strength;Impaired balance (sitting and/or standing);Impaired sensation;Impaired UE functional use;Pain   OT Treatment/Interventions: Self-care/ADL training;Therapeutic exercise;Energy conservation;DME and/or AE instruction;Therapeutic activities;Patient/family education;Balance training      OT Goals(Current goals can be found in the care plan section)   Acute Rehab OT Goals Patient Stated Goal: To get better OT Goal Formulation: With patient Time For Goal Achievement: 08/30/24 Potential to Achieve Goals: Good   OT Frequency:  Min 2X/week       AM-PAC OT 6 Clicks Daily Activity     Outcome Measure Help from another person eating meals?: None Help from another person taking care of personal grooming?: A Little Help from  another person toileting, which includes using toliet, bedpan, or urinal?: A Little Help from another person bathing (including washing, rinsing, drying)?: A Little Help from another person to put on and taking off regular upper body clothing?: A Little Help from another person to put on and taking off regular lower body clothing?: A Little 6 Click Score: 19   End of Session Nurse Communication: Mobility status  Activity Tolerance: Patient tolerated treatment well Patient left: in chair;with call bell/phone within reach  OT Visit Diagnosis: Unsteadiness on feet (R26.81);Muscle weakness (generalized) (M62.81);Pain Pain - Right/Left: Right Pain - part of body: Arm                Time: 9077-9051 OT Time Calculation (min): 26 min Charges:  OT General Charges $OT Visit: 1 Visit OT Evaluation $OT Eval Moderate Complexity: 1 Mod OT Treatments $Self Care/Home Management : 8-22 mins  Adrianne BROCKS, OT  Acute Rehabilitation Services Office [phone] Secure chat preferred   Adrianne GORMAN Savers 08/16/2024, 10:13 AM

## 2024-08-16 NOTE — Plan of Care (Signed)

## 2024-08-17 ENCOUNTER — Inpatient Hospital Stay (HOSPITAL_COMMUNITY)

## 2024-08-17 MED ORDER — KCL IN DEXTROSE-NACL 20-5-0.45 MEQ/L-%-% IV SOLN
INTRAVENOUS | Status: AC
  Filled 2024-08-17: qty 1000

## 2024-08-17 NOTE — Progress Notes (Addendum)
  Progress Note    08/17/2024 7:47 AM 2 Days Post-Op  Subjective: Still having significant pain in the axilla   Vitals:   08/17/24 0022 08/17/24 0346  BP: (!) 111/53 (!) 115/50  Pulse: 83 84  Resp: 20 20  Temp: 98.4 F (36.9 C) 98.3 F (36.8 C)  SpO2: 92% 98%    Physical Exam: General: Laying in bed, alert and oriented x 3 Cardiac: Regular Lungs: Nonlabored Incisions: Right axillary incision intact and dry Extremities: Right hand well-perfused with palpable radial pulse.  Intact motor and sensation of right hand   CBC    Component Value Date/Time   WBC 7.0 08/16/2024 0856   RBC 3.94 08/16/2024 0856   HGB 11.8 (L) 08/16/2024 0856   HGB 14.3 05/05/2024 1401   HCT 37.7 08/16/2024 0856   HCT 42.8 05/05/2024 1401   PLT 186 08/16/2024 0856   PLT 211 05/05/2024 1401   MCV 95.7 08/16/2024 0856   MCV 92 05/05/2024 1401   MCH 29.9 08/16/2024 0856   MCHC 31.3 08/16/2024 0856   RDW 13.9 08/16/2024 0856   RDW 13.5 05/05/2024 1401   LYMPHSABS 1.6 05/23/2024 1250   LYMPHSABS 2.1 05/05/2024 1401   MONOABS 0.5 05/23/2024 1250   EOSABS 0.2 05/23/2024 1250   EOSABS 0.1 05/05/2024 1401   BASOSABS 0.0 05/23/2024 1250   BASOSABS 0.1 05/05/2024 1401    BMET    Component Value Date/Time   NA 140 08/16/2024 0856   NA 141 07/19/2024 1644   K 4.3 08/16/2024 0856   CL 103 08/16/2024 0856   CO2 22 08/16/2024 0856   GLUCOSE 110 (H) 08/16/2024 0856   BUN 21 08/16/2024 0856   BUN 16 07/19/2024 1644   CREATININE 0.97 08/16/2024 0856   CREATININE 0.76 02/25/2015 1432   CALCIUM  9.0 08/16/2024 0856   GFRNONAA >60 08/16/2024 0856   GFRNONAA 67 06/12/2014 1327   GFRAA 84 01/15/2021 1540   GFRAA 77 06/12/2014 1327    INR    Component Value Date/Time   INR 1.0 08/15/2024 1639    No intake or output data in the 24 hours ending 08/17/24 0747    Assessment/Plan:  63 y.o. female is 2 days post op, s/p: right first rib resection   - The patient endorses continued  significant pain and soreness into her right axilla.  She does not feel ready to go home yet due to her pain -Right axillary incision is intact and dry with unchanged bruising -Right upper extremity is well-perfused with 2+ radial pulse.  She has intact motor and sensation of the upper extremity -Appreciate PT/OT evaluation.  Patient may benefit from outpatient OT upon discharge -Home Xarelto  was restarted yesterday -Continue to mobilize today as tolerated.  Possible discharge home tomorrow if pain is improved   Ahmed Holster, PA-C [facility] [phone] 08/17/2024 7:47 AM  I have independently interviewed and examined patient and agree with PA assessment and plan above.  She is having significant pain she did work with PT and OT and overall is progressing as expected however having significant postoperative pain.  Plan will be pain control today with continue mobilization possible discharge tomorrow but will need follow-up in 2 weeks at which time we will refer her to outpatient physical therapy.  Asuncion Tapscott C. Sheree, MD [facility] [place] Office: [phone] Pager: [phone]

## 2024-08-17 NOTE — Progress Notes (Signed)
 Called to bedside to evaluate patient's abdomen. Patient has complex past abdominal surgical history, endorses past diagnosis of gastroparesis and small bowel obstructions. On exam, the patient is nauseated but has not vomited.  She has colicky abdominal pain.  No bowel function today.  She has distended, but soft abdomen.  N.p.o. for now. Check KUB. Resume IV hydration. Monitor symptoms.  Debby SAILOR. Magda, MD [facility] Phone Number: [phone] 08/17/2024 6:53 PM

## 2024-08-17 NOTE — Progress Notes (Signed)
 Mobility Specialist Progress Note:    08/17/24 1137  Mobility  Activity Ambulated with assistance  Level of Assistance Minimal assist, patient does 75% or more  Assistive Device None  Distance Ambulated (ft) 80 ft  Activity Response Tolerated well  Mobility Referral Yes  Mobility visit 1 Mobility  Mobility Specialist Start Time (ACUTE ONLY) 1050  Mobility Specialist Stop Time (ACUTE ONLY) 1104  Mobility Specialist Time Calculation (min) (ACUTE ONLY) 14 min   Pt received in bed agreeable to mobility. Pt required MinA for bed mobility d/t strong RUE and R side pain. No physical assistance needed during ambulation. Returned to room w/o fault. Left in bed w/ call bell and personal belongings in reach. All needs met. RN aware.   Thersia Minder Mobility Specialist  Please contact vis Secure Chat or  Rehab Office [phone]

## 2024-08-18 MED ORDER — HYDROMORPHONE HCL 1 MG/ML IJ SOLN
1.0000 mg | INTRAMUSCULAR | Status: DC | PRN
  Administered 2024-08-18 – 2024-08-22 (×18): 1 mg via INTRAVENOUS
  Filled 2024-08-18 (×18): qty 1

## 2024-08-18 NOTE — Progress Notes (Addendum)
  Progress Note    08/18/2024 7:45 AM 3 Days Post-Op  Subjective:  abd feeling better.  R arm pain improving as well   Vitals:   08/17/24 2306 08/18/24 0404  BP: (!) 126/57 (!) 98/53  Pulse: 92 90  Resp: 20 20  Temp: 99.2 F (37.3 C) 98.7 F (37.1 C)  SpO2: 98% 97%   Physical Exam: Lungs:  non labored Incisions:  R axilla c/d/I without hematoma Extremities:  palpable R radial pulse Abdomen:  soft, diffuse tenderness Neurologic: A&O  CBC    Component Value Date/Time   WBC 7.0 08/16/2024 0856   RBC 3.94 08/16/2024 0856   HGB 11.8 (L) 08/16/2024 0856   HGB 14.3 05/05/2024 1401   HCT 37.7 08/16/2024 0856   HCT 42.8 05/05/2024 1401   PLT 186 08/16/2024 0856   PLT 211 05/05/2024 1401   MCV 95.7 08/16/2024 0856   MCV 92 05/05/2024 1401   MCH 29.9 08/16/2024 0856   MCHC 31.3 08/16/2024 0856   RDW 13.9 08/16/2024 0856   RDW 13.5 05/05/2024 1401   LYMPHSABS 1.6 05/23/2024 1250   LYMPHSABS 2.1 05/05/2024 1401   MONOABS 0.5 05/23/2024 1250   EOSABS 0.2 05/23/2024 1250   EOSABS 0.1 05/05/2024 1401   BASOSABS 0.0 05/23/2024 1250   BASOSABS 0.1 05/05/2024 1401    BMET    Component Value Date/Time   NA 140 08/16/2024 0856   NA 141 07/19/2024 1644   K 4.3 08/16/2024 0856   CL 103 08/16/2024 0856   CO2 22 08/16/2024 0856   GLUCOSE 110 (H) 08/16/2024 0856   BUN 21 08/16/2024 0856   BUN 16 07/19/2024 1644   CREATININE 0.97 08/16/2024 0856   CREATININE 0.76 02/25/2015 1432   CALCIUM  9.0 08/16/2024 0856   GFRNONAA >60 08/16/2024 0856   GFRNONAA 67 06/12/2014 1327   GFRAA 84 01/15/2021 1540   GFRAA 77 06/12/2014 1327    INR    Component Value Date/Time   INR 1.0 08/15/2024 1639     Intake/Output Summary (Last 24 hours) at 08/18/2024 0745 Last data filed at 08/18/2024 0415 Gross per 24 hour  Intake 171.4 ml  Output --  Net 171.4 ml     Assessment/Plan:  63 y.o. female is s/p R 1st rib resection 3 Days Post-Op   Subjectively feeling better.  Abd pain  improving.  R arm ROM improving as pain level decreases.  KUB negative for obstruction.  Regular diet ordered.  We will plan for discharge home later today   Donnice Sender, PA-C [facility] [phone] 08/18/2024 7:45 AM  I have seen and evaluated the patient. I agree with the PA note as documented above.  Status post transaxillary first rib resection for venous and neurogenic thoracic outlet on 8/19 with Dr. Sheree.  Pain is improving.  Incision looks good.  She feels ready for discharge.  Follow-up arranged already.  Lonni DOROTHA Gaskins, MD [facility] [place] Office: [phone]

## 2024-08-18 NOTE — TOC Transition Note (Signed)
 Transition of Care (TOC) - Discharge Note Rayfield Gobble RN, BSN Inpatient Care Management Unit 4E- RN Case Manager See Treatment Team for direct phone #   Patient Details  Name: [name] MRN: [id number] Date of Birth: Sep 03, 1961  Transition of Care [facility]) CM/SW Contact:  Gobble Rayfield Hurst, RN Phone Number: 08/18/2024, 9:58 AM   Clinical Narrative:    D/C order placed, pt to transition home later today if pain controlled.  Family to transport home.   CM received notice from [facility] liaison that [facility] office made referral for [facility] needs. Liaison Zebedee has spoken with pt and pt is agreeable to services, [facility] will initiate [facility] office protocol and follow for start of care.   Pt has needed DME at home including RW, BSC and wheelchair.   No further IP CM needs noted.     Final next level of care: Home w Home Health Services Barriers to Discharge: No Barriers Identified   Patient Goals and CMS Choice Patient states their goals for this hospitalization and ongoing recovery are:: return home   Choice offered to / list presented to : Patient      Discharge Placement               Home w/ [facility]        Discharge Plan and Services Additional resources added to the After Visit Summary for     Discharge Planning Services: CM Consult Post Acute Care Choice: Home Health          DME Arranged: N/A DME Agency: NA       HH Arranged: RN, PT, OT HH Agency: [facility] Date HH Agency Contacted: 08/18/24 Time HH Agency Contacted: [phone] Representative spoke with at [facility] Agency: Zebedee  Social Drivers of Health (SDOH) Interventions SDOH Screenings   Food Insecurity: No Food Insecurity (08/15/2024)  Housing: Low Risk  (08/15/2024)  Transportation Needs: No Transportation Needs (08/15/2024)  Utilities: Not At Risk (08/15/2024)  Alcohol Screen: Low Risk  (10/20/2023)  Depression (PHQ2-9): Low Risk  (06/22/2024)  Recent Concern: Depression  (PHQ2-9) - Medium Risk (05/05/2024)  Financial Resource Strain: Low Risk  (07/19/2024)  Physical Activity: Insufficiently Active (07/19/2024)  Social Connections: Moderately Isolated (07/19/2024)  Stress: No Stress Concern Present (07/19/2024)  Tobacco Use: Low Risk  (08/15/2024)  Health Literacy: Medium Risk (07/10/2024)   Received from [facility]     Readmission Risk Interventions    08/18/2024    9:58 AM 04/23/2023    9:08 AM 06/05/2022   11:24 AM  Readmission Risk Prevention Plan  Transportation Screening Complete Complete Complete  HRI or Home Care Consult Complete Complete   Social Work Consult for Recovery Care Planning/Counseling Complete Complete   Palliative Care Screening Not Applicable Not Applicable   Medication Review Oceanographer) Complete Complete Complete  HRI or Home Care Consult   Complete  SW Recovery Care/Counseling Consult   Complete  Palliative Care Screening   Not Applicable  Skilled Nursing Facility   Not Applicable

## 2024-08-18 NOTE — Plan of Care (Signed)

## 2024-08-18 NOTE — Care Management Important Message (Signed)
 Important Message  Patient Details  Name: [name] MRN: [id number] Date of Birth: 31-Aug-1961   Important Message Given:  Yes - Medicare IM     [name] 08/18/2024, 10:43 AM

## 2024-08-18 NOTE — Progress Notes (Signed)
 Orthopedic Tech Progress Note Patient Details:  [name] [birth year] [id number]  Patient ID: [name], female   DOB: [birth year], 63 y.o.   MRN: [id number]  RN called to inform the sling previously dropped off was too small. I dropped of a large at the nurse's desk.  Ivone Licht L Hazeline Charnley 08/18/2024, 5:07 AM

## 2024-08-18 NOTE — Progress Notes (Signed)
 Patient is having  excruciating pain in her abdomen says pain flare up after she eats,requiring iv dilaudid  ever 2 hour  She refused to get discharge says she have to come back to the ED if she goes home now and is requesting increased amount of iv pain medicine ,Donnice PA notified,iv dilaudid  dose increased from 0.5 to 1 mg every 3 hours advice to keep her one more night

## 2024-08-18 NOTE — Progress Notes (Signed)
 Occupational Therapy Treatment Patient Details Name: [name] MRN: [id number] DOB: 1961/12/13 Today's Date: 08/18/2024   History of present illness Pt is a 63 y.o female admitted 8/19 for RUE pain likely d/t complex regional pain syndrome and 1 component is neurogenic and possible venous thoracic outlet syndrome. S/p R transaxillary 1st rib resection 8/19.   OT comments  Pt progressing towards goals. Pt received in bed c/o of gastroparesis flare-up. Requesting assistance to ambulate to bathroom. Min HH assist and use of IV pole to mobilize to bathroom, balance deficits likely d/t pain. Pt with BM and vomiting, RN notified. OT assisted pt with return to bed. Continue to recommend outpatient OT to improve UE strength, ROM, and sensation. Will continue to follow acutely.       If plan is discharge home, recommend the following:  A little help with bathing/dressing/bathroom;Assistance with cooking/housework   Equipment Recommendations  None recommended by OT       Precautions / Restrictions Precautions Precautions: None Recall of Precautions/Restrictions: Intact Restrictions Weight Bearing Restrictions Per Provider Order: No Other Position/Activity Restrictions: RUE sling for comfort       Mobility Bed Mobility Overal bed mobility: Needs Assistance Bed Mobility: Supine to Sit     Supine to sit: Contact guard, HOB elevated     General bed mobility comments: CGA for lines    Transfers Overall transfer level: Needs assistance Equipment used: 1 person hand held assist Transfers: Sit to/from Stand Sit to Stand: Min assist           General transfer comment: Min HH assist and use of IV pole to mobilize, pt often bracing for pain     Balance Overall balance assessment: Mild deficits observed, not formally tested       ADL either performed or assessed with clinical judgement   ADL Overall ADL's : Needs assistance/impaired     Grooming: Wash/dry  hands;Minimal assistance;Standing Grooming Details (indicate cue type and reason): Min assist to steady in standing             Lower Body Dressing: Contact guard assist;Sit to/from stand Lower Body Dressing Details (indicate cue type and reason): CGA to doff pants, use of GB to steady Toilet Transfer: Minimal assistance;Ambulation;Regular Teacher, adult education Details (indicate cue type and reason): Min HH assist and use of IV pole, likely d/t pain Toileting- Clothing Manipulation and Hygiene: Supervision/safety;Sitting/lateral lean Toileting - Clothing Manipulation Details (indicate cue type and reason): Performed perihygiene sitting     Functional mobility during ADLs: Minimal assistance General ADL Comments: Pt limited d/t abdominal pain, often bracing for pain and moaning.    Extremity/Trunk Assessment Upper Extremity Assessment Upper Extremity Assessment: RUE deficits/detail RUE Deficits / Details: S/p R transaxillary 1st rib resection 8/19,  venous thoracic outlet syndrome RUE: Shoulder pain with ROM RUE Sensation: decreased light touch RUE Coordination: decreased fine motor   Lower Extremity Assessment Lower Extremity Assessment: Defer to PT evaluation        Vision   Vision Assessment?: No apparent visual deficits         Communication Communication Communication: No apparent difficulties   Cognition Arousal: Alert Behavior During Therapy: WFL for tasks assessed/performed Cognition: No apparent impairments       Following commands: Intact        Cueing   Cueing Techniques: Verbal cues        General Comments While in bathroom, pt became nauseous and began vomitting, RN notified and provided meds    Pertinent  Vitals/ Pain       Pain Assessment Pain Assessment: Faces Faces Pain Scale: Hurts worst Pain Location: Stomach Pain Descriptors / Indicators: Grimacing, Guarding, Restless, Shooting, Moaning Pain Intervention(s): Repositioned, RN gave pain  meds during session   Frequency  Min 2X/week        Progress Toward Goals  OT Goals(current goals can now be found in the care plan section)  Progress towards OT goals: Progressing toward goals  Acute Rehab OT Goals Patient Stated Goal: To rest OT Goal Formulation: With patient Time For Goal Achievement: 08/30/24 Potential to Achieve Goals: Good ADL Goals Pt Will Perform Grooming: with modified independence;standing Pt Will Perform Upper Body Dressing: with modified independence;sitting Pt Will Perform Lower Body Dressing: with modified independence;sit to/from stand Pt Will Transfer to Toilet: with modified independence;ambulating;regular height toilet Pt Will Perform Toileting - Clothing Manipulation and hygiene: with modified independence;sitting/lateral leans;sit to/from stand  Plan         AM-PAC OT 6 Clicks Daily Activity     Outcome Measure   Help from another person eating meals?: None Help from another person taking care of personal grooming?: A Little Help from another person toileting, which includes using toliet, bedpan, or urinal?: A Little Help from another person bathing (including washing, rinsing, drying)?: A Little Help from another person to put on and taking off regular upper body clothing?: A Little Help from another person to put on and taking off regular lower body clothing?: A Little 6 Click Score: 19    End of Session    OT Visit Diagnosis: Unsteadiness on feet (R26.81);Muscle weakness (generalized) (M62.81);Pain Pain - Right/Left: Right Pain - part of body: Arm   Activity Tolerance Patient limited by pain   Patient Left in bed;with call bell/phone within reach;with family/visitor present   Nurse Communication Mobility status;Other (comment) (Nausea)        Time: 1358-1430 OT Time Calculation (min): 32 min  Charges: OT General Charges $OT Visit: 1 Visit OT Treatments $Self Care/Home Management : 23-37 mins  Adrianne BROCKS, OT   Acute Rehabilitation Services Office [phone] Secure chat preferred   Adrianne GORMAN Savers 08/18/2024, 2:47 PM

## 2024-08-19 ENCOUNTER — Inpatient Hospital Stay (HOSPITAL_COMMUNITY)

## 2024-08-19 MED ORDER — IOHEXOL 350 MG/ML SOLN: 75.0000 mL | Freq: Once | INTRAVENOUS | Status: DC | PRN

## 2024-08-19 MED ORDER — KETOROLAC TROMETHAMINE 30 MG/ML IJ SOLN
30.0000 mg | Freq: Four times a day (QID) | INTRAMUSCULAR | Status: DC | PRN
  Administered 2024-08-19 – 2024-08-22 (×6): 30 mg via INTRAVENOUS
  Filled 2024-08-19 (×6): qty 1

## 2024-08-19 MED ORDER — IOHEXOL 9 MG/ML PO SOLN
500.0000 mL | ORAL | Status: AC
  Administered 2024-08-19 (×2): 500 mL via ORAL

## 2024-08-19 MED ORDER — IOHEXOL 9 MG/ML PO SOLN
ORAL | Status: AC
  Filled 2024-08-19: qty 1000

## 2024-08-19 NOTE — Progress Notes (Signed)
 RN paged PA, Donnice Sender. Pt is having 10/10 abdominal pain. IV dilaudid  and IV toradol  given. Not much relief. Husband, [name], at bedside. He is concerned as well because he said, her stomach has never been this big with a gastroparesis flare up. Family and pt requested to speak with PA. Number to room provided to PA.

## 2024-08-19 NOTE — Progress Notes (Signed)
 CT scan reviewed with radiology with the following tentative results: No free air Contrast moves all the way through Similar obstructive pattern from April CT scan     Results discussed in person with husband, [name] and patient   [name]

## 2024-08-19 NOTE — Progress Notes (Signed)
 RN came into room, and pt stated we have another problem. She showed RN her abdomen and stated that she was having another flare up of gastroparesis. Pt stated that she could visualize the cramping and movement in her bowels. RN inspected, and was able to see what the patient was stating. Pt has large abdominal scar from previous surgeries. Underneath scar there were multiple knot-looking nodules under the skin. They were tender to the touch, and soft. MD, Magda on the floor. RN spoke with MD and had him come into assess pt. He visualized the knots on the pt's abdomen. MD to put in NPO order, and KUB.

## 2024-08-19 NOTE — Progress Notes (Signed)
 OT Cancellation Note  Patient Details Name: [name] MRN: [id number] DOB: 1961-06-02   Cancelled Treatment:    Reason Eval/Treat Not Completed: Pain limiting ability to participate.   Attempted skilled OT treatment session.  RN present and administering meds upon my arrival.  Pt. Politely declines tx session secondary to severe pain in RUE and abdomen. Spouse assisted therapist asst. with pulling pt. Up in bed and repositioning to aide in improving comfort in bed along with extra blankets due to being cold.  Reviewed with pt. And spouse will check back as able if not tomorrow then on Monday.   Both verbalized understanding.     CHRISTELLA Nest Lorraine-COTA/L  08/19/2024, 1:06 PM

## 2024-08-19 NOTE — Progress Notes (Signed)
    Subjective  - POD # 4  Still complaining of pain   Physical Exam:  Right axillary incision without hematoma Palpable radial pulse Abdomen distended    Assessment/Plan:  POD #4  Right cephalic: Patient has recovered appropriately from surgery with no acute issues Abdominal pain.  She has an extensive history with bowel resection at [facility].  She is followed chronically for abdominal pain by GI in [place].  She had a abdominal x-ray that was negative for acute events.  I have encouraged her to continue ambulating.  I will try a dose of Toradol  to help with pain as I am going to minimize narcotics.  She continues to have her promotility agents administered.  Wells Baillie Mohammad 08/19/2024 9:18 AM --  Vitals:   08/19/24 0800 08/19/24 0805  BP:  (!) 115/56  Pulse:  86  Resp: 16   Temp: 98 F (36.7 C)   SpO2:      Intake/Output Summary (Last 24 hours) at 08/19/2024 0918 Last data filed at 08/18/2024 1528 Gross per 24 hour  Intake 928.04 ml  Output --  Net 928.04 ml     Laboratory CBC    Component Value Date/Time   WBC 7.0 08/16/2024 0856   HGB 11.8 (L) 08/16/2024 0856   HGB 14.3 05/05/2024 1401   HCT 37.7 08/16/2024 0856   HCT 42.8 05/05/2024 1401   PLT 186 08/16/2024 0856   PLT 211 05/05/2024 1401    BMET    Component Value Date/Time   NA 140 08/16/2024 0856   NA 141 07/19/2024 1644   K 4.3 08/16/2024 0856   CL 103 08/16/2024 0856   CO2 22 08/16/2024 0856   GLUCOSE 110 (H) 08/16/2024 0856   BUN 21 08/16/2024 0856   BUN 16 07/19/2024 1644   CREATININE 0.97 08/16/2024 0856   CREATININE 0.76 02/25/2015 1432   CALCIUM  9.0 08/16/2024 0856   GFRNONAA >60 08/16/2024 0856   GFRNONAA 67 06/12/2014 1327   GFRAA 84 01/15/2021 1540   GFRAA 77 06/12/2014 1327    COAG Lab Results  Component Value Date   INR 1.0 08/15/2024   INR 1.3 (H) 08/11/2024   INR 1.10 09/02/2011   No results found for: PTT  Antibiotics Anti-infectives (From admission, onward)     Start     Dose/Rate Route Frequency Ordered Stop   08/15/24 1600  ceFAZolin  (ANCEF ) IVPB 2g/100 mL premix        2 g 200 mL/hr over 30 Minutes Intravenous Every 8 hours 08/15/24 1251 08/15/24 2145   08/15/24 0559  ceFAZolin  (ANCEF ) IVPB 2g/100 mL premix        2 g 200 mL/hr over 30 Minutes Intravenous 30 min pre-op 08/15/24 0559 08/15/24 0803        V. Malvina Serene CLORE, M.D., [facility] [place] Office: [phone] Pager:  [phone]

## 2024-08-20 LAB — BASIC METABOLIC PANEL WITH GFR
Anion gap: 10 (ref 5–15)
BUN: 20 mg/dL (ref 8–23)
CO2: 23 mmol/L (ref 22–32)
Calcium: 8.4 mg/dL — ABNORMAL LOW (ref 8.9–10.3)
Chloride: 102 mmol/L (ref 98–111)
Creatinine, Ser: 0.99 mg/dL (ref 0.44–1.00)
GFR, Estimated: >60 mL/min (ref >60)
Glucose, Bld: 107 mg/dL — ABNORMAL HIGH (ref 70–99)
Potassium: 3.5 mmol/L (ref 3.5–5.1)
Sodium: 135 mmol/L (ref 135–145)

## 2024-08-20 LAB — CBC
HCT: 29.4 % — ABNORMAL LOW (ref 36.0–46.0)
Hemoglobin: 9.4 g/dL — ABNORMAL LOW (ref 12.0–15.0)
MCH: 30.1 pg (ref 26.0–34.0)
MCHC: 32 g/dL (ref 30.0–36.0)
MCV: 94.2 fL (ref 80.0–100.0)
Platelets: 240 K/uL (ref 150–400)
RBC: 3.12 MIL/uL — ABNORMAL LOW (ref 3.87–5.11)
RDW: 12.9 % (ref 11.5–15.5)
WBC: 7.4 K/uL (ref 4.0–10.5)
nRBC: 0 % (ref 0.0–0.2)

## 2024-08-20 MED ORDER — MILK AND MOLASSES ENEMA
1.0000 | Freq: Once | RECTAL | Status: AC
  Administered 2024-08-20: 150 mL via RECTAL
  Filled 2024-08-20: qty 150

## 2024-08-20 NOTE — Progress Notes (Signed)
    Subjective  - POD #5  Still abdominal pain.  Has had some loose stools   Physical Exam:  Abdomen soft but tender Palpable radial pulse      Assessment/Plan:  POD # 5  Husband present at bedside.  Again discussed CT scan findings.  She does appear to have a ileus versus constipation.  She has stool in the rectal vault on CT scan and so I will give her a enema today to see if we can get her bowels moving.  Also encouraged her to ambulate is much as possible.  Wells Wesam Gearhart 08/20/2024 9:48 AM --  Vitals:   08/20/24 0300 08/20/24 0801  BP: 103/65 (!) 114/57  Pulse: 94   Resp: 15 16  Temp: 100.2 F (37.9 C) 98 F (36.7 C)  SpO2: 90%     Intake/Output Summary (Last 24 hours) at 08/20/2024 0948 Last data filed at 08/19/2024 1812 Gross per 24 hour  Intake 720 ml  Output --  Net 720 ml     Laboratory CBC    Component Value Date/Time   WBC 7.4 08/20/2024 0404   HGB 9.4 (L) 08/20/2024 0404   HGB 14.3 05/05/2024 1401   HCT 29.4 (L) 08/20/2024 0404   HCT 42.8 05/05/2024 1401   PLT 240 08/20/2024 0404   PLT 211 05/05/2024 1401    BMET    Component Value Date/Time   NA 135 08/20/2024 0404   NA 141 07/19/2024 1644   K 3.5 08/20/2024 0404   CL 102 08/20/2024 0404   CO2 23 08/20/2024 0404   GLUCOSE 107 (H) 08/20/2024 0404   BUN 20 08/20/2024 0404   BUN 16 07/19/2024 1644   CREATININE 0.99 08/20/2024 0404   CREATININE 0.76 02/25/2015 1432   CALCIUM  8.4 (L) 08/20/2024 0404   GFRNONAA >60 08/20/2024 0404   GFRNONAA 67 06/12/2014 1327   GFRAA 84 01/15/2021 1540   GFRAA 77 06/12/2014 1327    COAG Lab Results  Component Value Date   INR 1.0 08/15/2024   INR 1.3 (H) 08/11/2024   INR 1.10 09/02/2011   No results found for: PTT  Antibiotics Anti-infectives (From admission, onward)    Start     Dose/Rate Route Frequency Ordered Stop   08/15/24 1600  ceFAZolin  (ANCEF ) IVPB 2g/100 mL premix        2 g 200 mL/hr over 30 Minutes Intravenous Every 8  hours 08/15/24 1251 08/15/24 2145   08/15/24 0559  ceFAZolin  (ANCEF ) IVPB 2g/100 mL premix        2 g 200 mL/hr over 30 Minutes Intravenous 30 min pre-op 08/15/24 0559 08/15/24 0803        V. Malvina Serene CLORE, M.D., [facility] of [place] Office: [phone] Pager:  [phone]

## 2024-08-20 NOTE — Plan of Care (Signed)

## 2024-08-21 MED ORDER — MILK AND MOLASSES ENEMA
1.0000 | Freq: Every day | RECTAL | Status: DC
  Administered 2024-08-21: 150 mL via RECTAL
  Filled 2024-08-21 (×2): qty 150

## 2024-08-21 NOTE — Care Management Important Message (Signed)
 Important Message  Patient Details  Name: Serenity Batley MRN: [id number] Date of Birth: 1961-09-23   Important Message Given:  Yes - Medicare IM     [name] 08/21/2024, 10:49 AM

## 2024-08-21 NOTE — Progress Notes (Signed)
 Occupational Therapy Treatment Patient Details Name: [name] MRN: [id number] DOB: Dec 18, 1961 Today's Date: 08/21/2024   History of present illness Pt is a 63 y.o female admitted 8/19 for RUE pain likely d/t complex regional pain syndrome and 1 component is neurogenic and possible venous thoracic outlet syndrome. S/p R transaxillary 1st rib resection 8/19.   OT comments  Pt presented in bed sleeping as they reported being up most the night in pain. Pt at this time needed max cues to keep eyes open and HH assist to assist to sink and bathroom in session. She completed ADLS while standing at sink with CGA. Pt then reported only some numbness in RUE and getting back AROM in RUE. At this time recommendation for OP Occupational Therapy.       If plan is discharge home, recommend the following:  A little help with bathing/dressing/bathroom;Assistance with cooking/housework   Equipment Recommendations  None recommended by OT    Recommendations for Other Services      Precautions / Restrictions Precautions Precautions: None Recall of Precautions/Restrictions: Intact Required Braces or Orthoses: Sling Restrictions Weight Bearing Restrictions Per Provider Order: No Other Position/Activity Restrictions: RUE sling for comfort       Mobility Bed Mobility Overal bed mobility: Needs Assistance Bed Mobility: Supine to Sit, Sit to Supine     Supine to sit: Modified independent (Device/Increase time) Sit to supine: Modified independent (Device/Increase time)        Transfers Overall transfer level: Needs assistance Equipment used: 1 person hand held assist Transfers: Sit to/from Stand Sit to Stand: Contact guard assist           General transfer comment: HH assist due to pt very tired and often needing cues to keep eyes open in the session     Balance Overall balance assessment: Needs assistance Sitting-balance support: Feet supported Sitting balance-Leahy Scale:  Good     Standing balance support: Single extremity supported Standing balance-Leahy Scale: Fair Standing balance comment: due to fatigue, pt then with increase in abdominal pain lean over sink in session for support                           ADL either performed or assessed with clinical judgement   ADL Overall ADL's : Needs assistance/impaired Eating/Feeding: Sitting;Independent   Grooming: Wash/dry hands;Wash/dry face;Contact guard assist;Standing   Upper Body Bathing: Contact guard assist;Sitting       Upper Body Dressing : Contact guard assist;Sitting       Toilet Transfer: Contact guard assist   Toileting- Clothing Manipulation and Hygiene: Contact guard assist;Sit to/from stand       Functional mobility during ADLs: Contact guard assist      Extremity/Trunk Assessment Upper Extremity Assessment Upper Extremity Assessment: RUE deficits/detail RUE Deficits / Details: S/p R transaxillary 1st rib resection 8/19,  venous thoracic outlet syndrome RUE Sensation: decreased light touch RUE Coordination: decreased fine motor   Lower Extremity Assessment Lower Extremity Assessment: Defer to PT evaluation        Vision   Vision Assessment?: No apparent visual deficits   Perception     Praxis     Communication Communication Communication: No apparent difficulties   Cognition Arousal: Alert Behavior During Therapy: WFL for tasks assessed/performed Cognition: No apparent impairments                               Following  commands: Intact        Cueing      Exercises      Shoulder Instructions       General Comments      Pertinent Vitals/ Pain       Pain Assessment Pain Assessment: Faces Faces Pain Scale: Hurts a little bit Pain Location: Stomach Pain Descriptors / Indicators: Aching Pain Intervention(s): Limited activity within patient's tolerance, Repositioned  Home Living                                           Prior Functioning/Environment              Frequency  Min 2X/week        Progress Toward Goals  OT Goals(current goals can now be found in the care plan section)  Progress towards OT goals: Progressing toward goals  Acute Rehab OT Goals Patient Stated Goal: to figure out my abdomen pain OT Goal Formulation: With patient Time For Goal Achievement: 08/30/24 Potential to Achieve Goals: Good ADL Goals Pt Will Perform Grooming: with modified independence;standing Pt Will Perform Upper Body Dressing: with modified independence;sitting Pt Will Perform Lower Body Dressing: with modified independence;sit to/from stand Pt Will Transfer to Toilet: with modified independence;ambulating;regular height toilet Pt Will Perform Toileting - Clothing Manipulation and hygiene: with modified independence;sitting/lateral leans;sit to/from stand  Plan      Co-evaluation                 AM-PAC OT 6 Clicks Daily Activity     Outcome Measure   Help from another person eating meals?: None Help from another person taking care of personal grooming?: None Help from another person toileting, which includes using toliet, bedpan, or urinal?: A Little Help from another person bathing (including washing, rinsing, drying)?: A Little Help from another person to put on and taking off regular upper body clothing?: A Little Help from another person to put on and taking off regular lower body clothing?: A Little 6 Click Score: 20    End of Session Equipment Utilized During Treatment: Gait belt  OT Visit Diagnosis: Unsteadiness on feet (R26.81);Muscle weakness (generalized) (M62.81);Pain Pain - Right/Left: Right Pain - part of body: Arm   Activity Tolerance Patient tolerated treatment well   Patient Left in bed;with call bell/phone within reach;with bed alarm set   Nurse Communication Mobility status (BM)        Time: 8870-8846 OT Time Calculation (min): 24  min  Charges: OT General Charges $OT Visit: 1 Visit OT Treatments $Self Care/Home Management : 23-37 mins  Warrick POUR OTR/L  Acute Rehab Services  [phone] office number   Warrick Berber 08/21/2024, 12:04 PM

## 2024-08-21 NOTE — Plan of Care (Signed)

## 2024-08-21 NOTE — Progress Notes (Signed)
  Progress Note    08/21/2024 8:30 AM 6 Days Post-Op  Subjective: Complaining of ongoing abdominal pain   Vitals:   08/21/24 0210 08/21/24 0802  BP: (!) 101/58 (!) 139/58  Pulse: 91 90  Resp: 11 15  Temp: 98.1 F (36.7 C) 98.2 F (36.8 C)  SpO2: 94%    Physical Exam: Lungs:  non labored Incisions:  R axilla c/d/I without hematoma Extremities:  palpable R radial Neurologic: a&O  CBC    Component Value Date/Time   WBC 7.4 08/20/2024 0404   RBC 3.12 (L) 08/20/2024 0404   HGB 9.4 (L) 08/20/2024 0404   HGB 14.3 05/05/2024 1401   HCT 29.4 (L) 08/20/2024 0404   HCT 42.8 05/05/2024 1401   PLT 240 08/20/2024 0404   PLT 211 05/05/2024 1401   MCV 94.2 08/20/2024 0404   MCV 92 05/05/2024 1401   MCH 30.1 08/20/2024 0404   MCHC 32.0 08/20/2024 0404   RDW 12.9 08/20/2024 0404   RDW 13.5 05/05/2024 1401   LYMPHSABS 1.6 05/23/2024 1250   LYMPHSABS 2.1 05/05/2024 1401   MONOABS 0.5 05/23/2024 1250   EOSABS 0.2 05/23/2024 1250   EOSABS 0.1 05/05/2024 1401   BASOSABS 0.0 05/23/2024 1250   BASOSABS 0.1 05/05/2024 1401    BMET    Component Value Date/Time   NA 135 08/20/2024 0404   NA 141 07/19/2024 1644   K 3.5 08/20/2024 0404   CL 102 08/20/2024 0404   CO2 23 08/20/2024 0404   GLUCOSE 107 (H) 08/20/2024 0404   BUN 20 08/20/2024 0404   BUN 16 07/19/2024 1644   CREATININE 0.99 08/20/2024 0404   CREATININE 0.76 02/25/2015 1432   CALCIUM  8.4 (L) 08/20/2024 0404   GFRNONAA >60 08/20/2024 0404   GFRNONAA 67 06/12/2014 1327   GFRAA 84 01/15/2021 1540   GFRAA 77 06/12/2014 1327    INR    Component Value Date/Time   INR 1.0 08/15/2024 1639     Intake/Output Summary (Last 24 hours) at 08/21/2024 0830 Last data filed at 08/21/2024 9366 Gross per 24 hour  Intake 100 ml  Output --  Net 100 ml     Assessment/Plan:  63 y.o. female is s/p R 1st rib resection 6 Days Post-Op   R arm well perfused; good AROM on exam; pain minimal; incision healing well Ongoing abd  pain.  CT negative for obstruction.  Enema ordered due to stool burden noted on CT D/c home when pain controlled   Donnice Sender, PA-C [facility] [phone] 08/21/2024 8:30 AM  I have independently interviewed and examined patient and agree with PA assessment and plan above. Healing well from right 1st rib resection.   Quinton Voth C. Sheree, MD [facility] [place] Office: [phone] Pager: [phone]

## 2024-08-22 MED ORDER — SENNA 8.6 MG PO TABS
1.0000 | ORAL_TABLET | Freq: Two times a day (BID) | ORAL | Status: DC
  Filled 2024-08-22: qty 1

## 2024-08-22 MED ORDER — POLYETHYLENE GLYCOL 3350 17 G PO PACK
17.0000 g | PACK | Freq: Two times a day (BID) | ORAL | Status: DC
  Filled 2024-08-22: qty 1

## 2024-08-22 NOTE — Progress Notes (Addendum)
 Progress Note    08/22/2024 7:16 AM 7 Days Post-Op  Subjective:  says she did not have a normal bowel movement yesterday after enema.  Still having off and on abdominal pain.  Has walked in the room to the bathroom and back several times.  Has had numerous bowel surgeries in the past. Does not think she has been getting her Linzess  in the hospital.    Afebrile HR 70's-90's NSR 100's-110's systolic 98% RA  Vitals:   08/21/24 2326 08/22/24 0319  BP: (!) 101/57 111/63  Pulse: 88 84  Resp: 14 16  Temp: 97.9 F (36.6 C) 97.9 F (36.6 C)  SpO2: 96% 98%    Physical Exam: General:  no distress Cardiac:  regular Lungs:  non labored Incisions:  clean and dry and healing nicely Extremities:  palpable right radial pulse Abdomen:  soft  CBC    Component Value Date/Time   WBC 7.4 08/20/2024 0404   RBC 3.12 (L) 08/20/2024 0404   HGB 9.4 (L) 08/20/2024 0404   HGB 14.3 05/05/2024 1401   HCT 29.4 (L) 08/20/2024 0404   HCT 42.8 05/05/2024 1401   PLT 240 08/20/2024 0404   PLT 211 05/05/2024 1401   MCV 94.2 08/20/2024 0404   MCV 92 05/05/2024 1401   MCH 30.1 08/20/2024 0404   MCHC 32.0 08/20/2024 0404   RDW 12.9 08/20/2024 0404   RDW 13.5 05/05/2024 1401   LYMPHSABS 1.6 05/23/2024 1250   LYMPHSABS 2.1 05/05/2024 1401   MONOABS 0.5 05/23/2024 1250   EOSABS 0.2 05/23/2024 1250   EOSABS 0.1 05/05/2024 1401   BASOSABS 0.0 05/23/2024 1250   BASOSABS 0.1 05/05/2024 1401    BMET    Component Value Date/Time   NA 135 08/20/2024 0404   NA 141 07/19/2024 1644   K 3.5 08/20/2024 0404   CL 102 08/20/2024 0404   CO2 23 08/20/2024 0404   GLUCOSE 107 (H) 08/20/2024 0404   BUN 20 08/20/2024 0404   BUN 16 07/19/2024 1644   CREATININE 0.99 08/20/2024 0404   CREATININE 0.76 02/25/2015 1432   CALCIUM  8.4 (L) 08/20/2024 0404   GFRNONAA >60 08/20/2024 0404   GFRNONAA 67 06/12/2014 1327   GFRAA 84 01/15/2021 1540   GFRAA 77 06/12/2014 1327    INR    Component Value Date/Time    INR 1.0 08/15/2024 1639     Intake/Output Summary (Last 24 hours) at 08/22/2024 0716 Last data filed at 08/22/2024 9362 Gross per 24 hour  Intake 560 ml  Output --  Net 560 ml      Assessment/Plan:  63 y.o. female is s/p:   R 1st rib resection   7 Days Post-Op   -pt with palpable right radial pulse and incision healing nicely.  Talked with her about keeping dry gauze to wick moisture to help prevent yeast and infection.   -her Linzess  was restarted on 8/20.   -still with some abdominal pain.  Did not have normal bowel movement after enema.  Cannot take mag citrate.  encouraged her to increase her walking in the hallways today.  Will d/w Dr. Sheree.  -DVT prophylaxis:  Xarelto  -has f/u with Dr. Sheree on 9/3    Lucie Apt, PA-C [facility] [phone] 08/22/2024 7:16 AM  I have independently interviewed and examined patient and agree with PA assessment and plan above. Recovering well. We discussed d/c and f/u with her GI at home as her symptoms appear to be improving.   Elester Apodaca C. Sheree, MD  [facility] [place] Office: [phone] Pager: [phone]

## 2024-08-22 NOTE — Discharge Summary (Signed)
 Discharge Summary    [name] August 20, 1961 63 y.o. female  [id number]  Admission Date: 08/15/2024  Discharge Date: 08/22/2024  Physician: Sheree Penne Millman*  Admission Diagnosis: Thoracic outlet syndrome [G54.0]   HPI:   This is a 63 y.o. female initially evaluated in our office for right upper extremity swelling. She had a history of a trauma requiring over a month-long admission and at that time had a PICC line in place. She has undergone balloon venoplasty of the right subclavian and axillary veins and since that time the swelling has resolved. She continues to have right upper extremity numbness and discoloration and she also has chest pain that radiates into her neck and has been evaluated by cardiology and there is no evidence of coronary disease. Most recently she has undergone scalene block and she states that after the block she was able to sleep for the first time without any neck and shoulder pain. She states that the pain is returning as the block was placed 1 week ago and currently she is worried that is going to get back to her baseline which at times is excruciating. She continues to have discoloration of the hand as well as pins-and-needles feelings but her swelling has resolved.   Hospital Course:  The patient was admitted to the hospital and taken to the operating room on 08/15/2024 and underwent: 1.  Transaxillary right first rib resection 2.  Percutaneous ultrasound-guided cannulation right basilic vein 3.  Right upper extremity venogram    Findings:  The right first rib was removed intact at completion there was no pneumothorax notable.  The right upper extremity veins were patent including paired brachial veins, axillary vein, subclavian vein, left innominate vein.  Venography centrally was performed with the patient's arm laterally and abducted.   The pt tolerated the procedure well and was transported to the PACU in good condition.   POD 1,  having significant pain in the right upper extremity today although the arm is warm and well-perfused with 2+ pulse, there is no edema and she is sensorimotor intact. Will mobilize today have PT and OT evaluate and work on pain control prior to consideration of discharge.   POD 2, MD called  to bedside to evaluate patient's abdomen. Patient has complex past abdominal surgical history, endorses past diagnosis of gastroparesis and small bowel obstructions. On exam, the patient is nauseated but has not vomited.  She has colicky abdominal pain.  No bowel function today.  She has distended, but soft abdomen.   N.p.o. for now. Check KUB. Resume IV hydration. Monitor symptoms.  POD 4, pt's pain improving.  KUB was negative for obstruction and regular diet reordered.   She is followed chronically for abdominal pain by GI in . She had a abdominal x-ray that was negative for acute events. I have encouraged her to continue ambulating. I will try a dose of Toradol  to help with pain as I am going to minimize narcotics. She continues to have her promotility agents administered.   She had CT scan that was reviewed with radiology and there was no free air, contrast moves all the way through and similar pattern from April CT scan.   POD 5, ileus vs constipation.  She was given enema and encouraged to ambulate as much as possible.   POD 6, ongoing abdominal pain.  Doing well from surgical standpoint.  Enema was ordered due to stool burden noted on CT scan.   POD 7, discussed wound care to help  prevent infection and yeast.  She did have bowel movement with enema.  She was discharged home.   CBC    Component Value Date/Time   WBC 7.4 08/20/2024 0404   RBC 3.12 (L) 08/20/2024 0404   HGB 9.4 (L) 08/20/2024 0404   HGB 14.3 05/05/2024 1401   HCT 29.4 (L) 08/20/2024 0404   HCT 42.8 05/05/2024 1401   PLT 240 08/20/2024 0404   PLT 211 05/05/2024 1401   MCV 94.2 08/20/2024 0404   MCV 92 05/05/2024  1401   MCH 30.1 08/20/2024 0404   MCHC 32.0 08/20/2024 0404   RDW 12.9 08/20/2024 0404   RDW 13.5 05/05/2024 1401   LYMPHSABS 1.6 05/23/2024 1250   LYMPHSABS 2.1 05/05/2024 1401   MONOABS 0.5 05/23/2024 1250   EOSABS 0.2 05/23/2024 1250   EOSABS 0.1 05/05/2024 1401   BASOSABS 0.0 05/23/2024 1250   BASOSABS 0.1 05/05/2024 1401    BMET    Component Value Date/Time   NA 135 08/20/2024 0404   NA 141 07/19/2024 1644   K 3.5 08/20/2024 0404   CL 102 08/20/2024 0404   CO2 23 08/20/2024 0404   GLUCOSE 107 (H) 08/20/2024 0404   BUN 20 08/20/2024 0404   BUN 16 07/19/2024 1644   CREATININE 0.99 08/20/2024 0404   CREATININE 0.76 02/25/2015 1432   CALCIUM  8.4 (L) 08/20/2024 0404   GFRNONAA >60 08/20/2024 0404   GFRNONAA 67 06/12/2014 1327   GFRAA 84 01/15/2021 1540   GFRAA 77 06/12/2014 1327        Discharge Diagnosis:  Thoracic outlet syndrome [G54.0]  Secondary Diagnosis: Patient Active Problem List   Diagnosis Date Noted   Thoracic outlet syndrome 08/15/2024   Hospital discharge follow-up 07/31/2024   Hypothyroid 07/31/2024   Low vitamin D  level 07/31/2024   Low magnesium  level 06/08/2024   Jerking 05/08/2024   Unresponsiveness 05/08/2024   Status post cervical spinal fusion 01/18/2024   History of cervical discectomy 01/18/2024   Cervical radicular pain 01/18/2024   Encounter for routine adult physical exam with abnormal findings 01/04/2024   TOS (thoracic outlet syndrome) 11/15/2023   Recurrent UTI (urinary tract infection) 10/17/2023   Right wrist pain 07/10/2023   Right arm pain 07/10/2023   Chronic anxiety 07/10/2023   Obesity (BMI 30.0-34.9) 07/10/2023   S/P hysterectomy with oophorectomy 05/25/2023   Vaginal dryness 05/25/2023   Encounter for support and coordination of transition of care 05/03/2023   Menopausal vaginal dryness 05/03/2023   Vaginal stenosis 05/03/2023   Atrial fibrillation, chronic (HCC) 04/23/2023   Onychomycosis 12/06/2022    Vaginitis 12/06/2022   Partial small bowel obstruction (HCC) 06/03/2022   Light-headedness 05/13/2022   Hypotensive episode 05/13/2022   Dehydration 05/12/2022   Opioid dependence (HCC) 04/02/2022   Myalgia, lower leg 04/01/2022   Generalized dysmotility of intestine    SBO (small bowel obstruction) (HCC) 03/28/2022   Hypokalemia    Hypomagnesemia    Dark stools    Anemia    Abnormal small bowel motility 11/28/2021   Gastroparesis 11/28/2021   Abnormal CT scan, small bowel    Dilation of small bowel anastomosis    Intractable vomiting 11/12/2021   Bilious vomiting with nausea 11/11/2021   Dilation of biliary tract 11/11/2021   Post-resection malabsorption 09/18/2021   Hematuria 07/18/2021   Bloating 04/17/2021   ADHD, predominantly hyperactive type 01/20/2021   Muscle spasm 01/20/2021   Ovarian cancer (HCC) 12/03/2020   Long-term current use of opiate analgesic 10/22/2020   Migraine 10/22/2020  Muscle spasms of both lower extremities 01/03/2020   Hip pain, right 07/08/2019   Low back pain radiating to right leg 07/04/2019   Abdominal pain 06/06/2019   B12 deficiency 10/11/2018   Other specified abnormal findings of blood chemistry 10/11/2018   Malabsorption 09/23/2018   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, left (HCC) 09/23/2018   Embedded foreign body 03/01/2018   Palpitations 08/22/2017   Bilateral hand pain 07/27/2016   History of DVT (deep vein thrombosis) 03/30/2016   History of pulmonary embolus (PE) 03/30/2016   S/P insertion of IVC (inferior vena caval) filter 03/05/2016   Headache syndrome 01/26/2016   Syncope 01/23/2016   Spinal stenosis of lumbar region with radiculopathy 08/28/2015   Dysuria 06/11/2015   Vitamin D  deficiency 05/05/2015   Uncontrolled pain 04/15/2015   Chronic right-sided low back pain with bilateral sciatica 07/17/2014   Chest pain 06/14/2014   Low ferritin level 06/14/2014   Adjustment disorder with anxiety  08/31/2013   Irregular heart rate 07/25/2013   Seasonal allergies 05/10/2013   Encounter for monitoring opioid maintenance therapy 01/16/2013   Chronic pain syndrome 01/16/2013   Ventral hernia 08/03/2012   Stress incontinence 02/18/2012   Insomnia 12/15/2011   Interstitial cystitis 02/17/2011   Metabolic syndrome X 02/10/2011   CARPAL TUNNEL SYNDROME, RIGHT 06/09/2010   OVARIAN CYST 12/26/2009   Iron  deficiency anemia 08/07/2008   Hx SBO 08/07/2008   Paget's disease of breast, left (HCC) 03/16/2008   Hypothyroidism 03/16/2008   Hyperlipidemia 03/16/2008   Anxiety state 03/16/2008   Whole body pain 03/16/2008   Fibromyalgia 03/16/2008   Past Medical History:  Diagnosis Date   Abdominal hernia 08/14/2019   Abdominal pain 06/06/2019   Acute bronchitis 05/01/2013   Acute cystitis 06/09/2010   Qualifier: Diagnosis of  By: Charlsie LPN, Brandi     Acute renal insufficiency 08/28/2011   Allergy    Phreesia 03/16/2021   Anemia    Phreesia 10/29/2020   Anxiety    Anxiety state 03/16/2008   Qualifier: Diagnosis of  By: Tita Lipps     Arthritis    Phreesia 10/29/2020   Back pain with radiation 07/17/2014   Bilateral hand pain 07/27/2016   Blood transfusion without reported diagnosis    Phreesia 10/29/2020   Breast cancer (HCC)    L breast- 2006   Cancer (HCC)    Phreesia 10/29/2020   Carpal tunnel syndrome    Chronic constipation    Chronic pain syndrome    followed by [facility]---  back   Clotting disorder (HCC)    Phreesia 10/29/2020   Depression    Dermatitis 12/15/2011   Difficult intubation    Patient thinks something was mentioned in 2022 at [facility] with Botox  Injections.   Educated about COVID-19 virus infection 05/14/2019   Fall at home 01/26/2016   Family history of adverse reaction to anesthesia    MOTHER--- PONV   Fatigue 09/17/2012   Fibromyalgia    FIBROMYALGIA 03/16/2008   Qualifier: Diagnosis of  By: Tita Lipps     GERD  (gastroesophageal reflux disease)    Headache disorder 01/16/2013   Headache syndrome 01/26/2016   Heart murmur 1996   Hip pain, right 07/08/2019   History of blood transfusion 08/2016   in [facility]   History of MRSA infection    lip abscess   History of ovarian cyst 06/2011   s/p  BSO   History of pulmonary embolus (PE) 1997   post EP with  ablation pulmonary veouns for SVT/ Atrial Fib.; also on 09/22/22 in [facility]   History of supraventricular tachycardia    s/p  ablation 1996  and 1997  by dr fernande   History of TIA (transient ischemic attack) 1997   post op EP ablation PE   Hyperlipidemia    Hyperlipidemia LDL goal <100 03/16/2008   Qualifier: Diagnosis of  By: Tita Lipps     Hypothyroidism    followed by pcp   Incisional hernia    Insomnia 12/15/2011   Intermittent palpitations 08/22/2017   Interstitial cystitis    09-13-2018   per pt last flare-up  May 2019 (followed by pcp)   INTERSTITIAL CYSTITIS 02/17/2011   Qualifier: Diagnosis of  By: Antonetta MD, Margaret     Iron  deficiency anemia    09-13-2018  PER PT STABLE   Irregular heart rate 07/25/2013   Lipoma of back    upper   Low back pain radiating to right leg 07/04/2019   Low ferritin level 06/14/2014   MDD (major depressive disorder), single episode, in full remission (HCC) 10/27/2017   Medically noncompliant 02/28/2015   Multiple missed appointments, both follow-up appointments and lab appointments.    Metabolic syndrome X 02/10/2011   Qualifier: Diagnosis of  By: Antonetta MD, Margaret  hBA1c is 5.8 in 02/2013    Migraines    Morbid obesity (HCC) 03/27/2013   Muscle spasm 01/03/2020   Nausea alone 07/17/2014   NECK PAIN, CHRONIC 03/16/2008   Qualifier: Diagnosis of  By: Tita Lipps     Normal coronary arteries    a. by CT 12/2018.   Obesity 03/16/2008   Qualifier: Diagnosis of  By: Tita Lipps     Oral ulceration 08/23/2014   Presented at 06/14/2014 visit    Other malaise and fatigue 03/16/2008   Centricity  Description: FATIGUE, CHRONIC Qualifier: Diagnosis of  By: Tita Lipps   Centricity Description: FATIGUE Qualifier: Diagnosis of  By: Windell PA, Dawn     OVARIAN CYST 12/26/2009   Qualifier: Diagnosis of  By: Antonetta MD, Margaret     Paget's disease of breast, left (HCC) 03/16/2008   Qualifier: Diagnosis of  By: Tita Lipps  Left diagnosed in 2006 F/h breast cancer x 15 family members   Palpitations    hx- appears to coincide with dehydration and electrolyte disturbances - see 08/01/24 note   Paroxysmal A-fib (HCC)    hx - appears to coincide with dehydration and electrolyte disturbances - see 08/01/24 note   Partial small bowel obstruction (HCC) 07/04/2012   Pneumonia    x 2   PONV (postoperative nausea and vomiting)    SEVERE   Postsurgical menopause 11/13/2011   Presence of IVC filter 06/06/2019   PSVT (paroxysmal supraventricular tachycardia) (HCC)    HX ABLATION 1996 AND 1997   Recurrent oral herpes simplex infection 10/13/2017   ROM (right otitis media) 01/23/2013   S/P insertion of IVC (inferior vena caval) filter 05/08/2005   greenfield (non-retrievable)  /  dx 2019  a leg of filter is protruding thru the vena cava in to right L2 vertebral body (09-13-2018  per pt having surgery to remove filter in [place])   S/P radiofrequency ablation operation for arrhythmia 1996   1996  and 1997,   SVT and Atrial Fib   Sinusitis, chronic 01/26/2016   SMALL BOWEL OBSTRUCTION, HX OF 08/07/2008   Annotation: obstruction w/ adhesions led to partial colectomy Qualifier: Diagnosis of  By: Georgina Palma     Stroke (  HCC)    Phreesia 10/29/2020, TIAs in the past -1989   Swelling of hand 09/17/2012   Syncope    Tachycardia 02/03/2010   Qualifier: Diagnosis of  By: Via LPN, Macario     Thyroid  disease    Phreesia 10/29/2020   TOS (thoracic outlet syndrome)    right side   Ulcer 12/15/2011   Urinary frequency 07/18/2014   Vaginitis and vulvovaginitis 05/10/2013   Vitamin D  deficiency  05/05/2015   Wears glasses      Allergies as of 08/22/2024       Reactions   Nitrofurantoin Hives   Crestor  [rosuvastatin  Calcium ] Other (See Comments)   Generalized cramps   Bisacodyl  Other (See Comments)   Makes patient feel like she is having cramps   Codeine Itching   Iron  Sucrose Other (See Comments)   Flushing- required benadryl  and solu medrol    Monosodium Glutamate Other (See Comments)   Cluster migraines   Scopolamine Hbr Other (See Comments)   Cluster migraines, impaired vision   Morphine  Rash, Hives   Other Hives, Itching, Swelling, Rash, Other (See Comments)   Cigarette smoke Causes Migraines         Medication List     TAKE these medications    acyclovir  ointment 5 % Commonly known as: ZOVIRAX  Apply to affected area(s) every 4 hours while awake for 5  days , then as needed What changed:  how much to take how to take this when to take this reasons to take this additional instructions   ALPRAZolam  1 MG tablet Commonly known as: XANAX  Take 1 tablet (1 mg total) by mouth 2 (two) times daily.   aspirin  81 MG chewable tablet Chew 81 mg by mouth in the morning.   BLINK TEARS OP Take 1 capsule by mouth at bedtime. Blink NutriTears Supplement for Dry Eyes   botulinum toxin Type A  200 units injection Commonly known as: BOTOX  Inject 155 units IM into multiple site in the face,neck and head once every 90 days   cyanocobalamin  1000 MCG/ML injection Commonly known as: VITAMIN B12 ADMINISTER 1 ML(1000 MCG) IN THE MUSCLE EVERY 30 DAYS   cyclobenzaprine  10 MG tablet Commonly known as: FLEXERIL  Take 1 tablet (10 mg total) by mouth 2 (two) times daily as needed for muscle spasms. What changed: when to take this   cycloSPORINE  0.05 % ophthalmic emulsion Commonly known as: RESTASIS  Place 1 drop into both eyes 2 (two) times daily as needed (dry eyes).   DULoxetine  60 MG capsule Commonly known as: CYMBALTA  TAKE 1 CAPSULE(60 MG) BY MOUTH DAILY   ezetimibe   10 MG tablet Commonly known as: ZETIA  TAKE 1 TABLET(10 MG) BY MOUTH DAILY What changed: See the new instructions.   fenofibrate  48 MG tablet Commonly known as: TRICOR  TAKE 1 TABLET(48 MG) BY MOUTH DAILY What changed: See the new instructions.   HYDROmorphone  4 MG tablet Commonly known as: DILAUDID  Take 4 mg by mouth 3 (three) times daily.   ICAPS AREDS 2 PO Take 1 capsule by mouth at bedtime.   Intrarosa 6.5 MG Inst Generic drug: Prasterone Place 1 suppository vaginally 2 (two) times a week. Mondays & Fridays   levothyroxine  125 MCG tablet Commonly known as: SYNTHROID  TAKE 1 TABLET(125 MCG) BY MOUTH DAILY BEFORE BREAKFAST   linaclotide  290 MCG Caps capsule Commonly known as: Linzess  Take 1 capsule (290 mcg total) by mouth daily before breakfast.   magnesium  oxide 400 (240 Mg) MG tablet Commonly known as: MAG-OX TAKE 1 TABLET BY MOUTH  FOUR TIMES DAILY What changed: when to take this   MegaRed Omega-3 Krill Oil 500 MG Caps Take 500 mg by mouth at bedtime.   metoCLOPramide  10 MG tablet Commonly known as: REGLAN  Take 1 tablet (10 mg total) by mouth every 6 (six) hours. What changed:  when to take this reasons to take this   metoprolol  succinate 25 MG 24 hr tablet Commonly known as: TOPROL -XL Take 25 mg by mouth daily as needed (elevated blood pressure). Patient states takes this meds for her heart.   naloxone 4 MG/0.1ML Liqd nasal spray kit Commonly known as: NARCAN Place 1 spray into the nose as needed (opioid reversal).   Nurtec 75 MG Tbdp Generic drug: Rimegepant Sulfate Medication Samples have been provided to the patient.  Drug name: Nurtec     Strength: 75mg         Qty: 2 boxes  LOT: [id number]  Exp.Date: 05/2025  Dosing instructions:   The patient has been instructed regarding the correct time, dose, and frequency of taking this medication, including desired effects and most common side effects.   Mahina A Allen 3:04 PM 06/23/2024   omeprazole  40 MG  capsule Commonly known as: PRILOSEC TAKE 1 CAPSULE(40 MG) BY MOUTH IN THE MORNING AND AT BEDTIME   OVER THE COUNTER MEDICATION Take 3 capsules by mouth in the morning. Nutrafol Hair Wellness supplement   potassium chloride  10 MEQ tablet Commonly known as: KLOR-CON  M TAKE 1 TABLET(10 MEQ) BY MOUTH TWICE DAILY   pravastatin  20 MG tablet Commonly known as: PRAVACHOL  TAKE 1 TABLET(20 MG) BY MOUTH DAILY What changed: See the new instructions.   Premarin  vaginal cream Generic drug: conjugated estrogens  INSERT pea sized amount three times weekly intravaginally What changed:  how much to take how to take this when to take this additional instructions   Promethegan 25 MG suppository Generic drug: promethazine  Place 25 mg rectally every 6 (six) hours as needed for nausea or vomiting.   Vitamin D  (Ergocalciferol ) 1.25 MG (50000 UNIT) Caps capsule Commonly known as: DRISDOL  Take 50,000 Units by mouth every 30 (thirty) days.   Xarelto  20 MG Tabs tablet Generic drug: rivaroxaban  TAKE 1 TABLET(20 MG) BY MOUTH DAILY        Prescriptions given: None given after PDMP review  Instructions: 1.  No driving for one week and while taking pain medication 2.  No heavy lifting until returning to see Dr. Sheree 3.  Shower daily with soap and water  starting 08/22/2024.  Keep incision dry with dry gauze to wick moisture to help prevent wound infection  Disposition: home  Patient's condition: is Good  Follow up: 1. Dr. Sheree 08/30/2024   Lucie Apt, PA-C [facility] [phone] 08/22/2024  1:26 PM

## 2024-08-22 NOTE — TOC Transition Note (Signed)
 Transition of Care (TOC) - Discharge Note Rayfield Gobble RN, BSN Inpatient Care Management Unit 4E- RN Case Manager See Treatment Team for direct phone #   Patient Details  Name: [name] MRN: [id number] Date of Birth: [birth year]  [facility] CM/SW Contact:  Gobble Rayfield Hurst, RN Phone Number: 08/22/2024, 2:59 PM   Clinical Narrative:    Pt stable for transition home today.  HH arranged with [facility] office referral to [facility]- liaison has spoken with pt who is agreeable to services with [facility].  No DME needs noted.   No further IP CM needs noted.    Final next level of care: Home w Home Health Services Barriers to Discharge: No Barriers Identified   Patient Goals and CMS Choice Patient states their goals for this hospitalization and ongoing recovery are:: return home   Choice offered to / list presented to : Patient      Discharge Placement                 Home w/ [facility]      Discharge Plan and Services Additional resources added to the After Visit Summary for     Discharge Planning Services: CM Consult Post Acute Care Choice: Home Health          DME Arranged: N/A DME Agency: NA       HH Arranged: RN, PT, OT HH Agency: [facility] Date HH Agency Contacted: 08/18/24 Time HH Agency Contacted: [phone] Representative spoke with at [facility] Agency: [facility]  Social Drivers of Health (SDOH) Interventions SDOH Screenings   Food Insecurity: No Food Insecurity (08/15/2024)  Housing: Low Risk  (08/15/2024)  Transportation Needs: No Transportation Needs (08/15/2024)  Utilities: Not At Risk (08/15/2024)  Alcohol Screen: Low Risk  (10/20/2023)  Depression (PHQ2-9): Low Risk  (06/22/2024)  Recent Concern: Depression (PHQ2-9) - Medium Risk (05/05/2024)  Financial Resource Strain: Low Risk  (07/19/2024)  Physical Activity: Insufficiently Active (07/19/2024)  Social Connections: Moderately Isolated (07/19/2024)  Stress: No Stress  Concern Present (07/19/2024)  Tobacco Use: Low Risk  (08/15/2024)  Health Literacy: Medium Risk (07/10/2024)   Received from [facility]     Readmission Risk Interventions    08/18/2024    9:58 AM 04/23/2023    9:08 AM 06/05/2022   11:24 AM  Readmission Risk Prevention Plan  Transportation Screening Complete Complete Complete  HRI or Home Care Consult Complete Complete   Social Work Consult for Recovery Care Planning/Counseling Complete Complete   Palliative Care Screening Not Applicable Not Applicable   Medication Review Oceanographer) Complete Complete Complete  HRI or Home Care Consult   Complete  SW Recovery Care/Counseling Consult   Complete  Palliative Care Screening   Not Applicable  Skilled Nursing Facility   Not Applicable

## 2024-08-22 NOTE — Progress Notes (Signed)
 Occupational Therapy Treatment Patient Details Name: [name] MRN: [id number] DOB: 1961-05-10 Today's Date: 08/22/2024   History of present illness Pt is a 63 y.o female admitted 8/19 for RUE pain likely d/t complex regional pain syndrome and 1 component is neurogenic and possible venous thoracic outlet syndrome. S/p R transaxillary 1st rib resection 8/19.   OT comments  Pt presented in bed and reported having a better night but continuing to have loose BMS and abdominal pain. Pt in session completed UE dressing , bathing with set up and CGA for LE dressing bathing with education on how to complete with modification with pain. Pt in session started to feel as if they were going to vomit but did not. She also had one LOB when turning quickly around and needed min assist to balance self. At this time recommendation for OP OT and Acute Occupational Therapy to follow.        If plan is discharge home, recommend the following:  A little help with bathing/dressing/bathroom;Assistance with cooking/housework   Equipment Recommendations  None recommended by OT    Recommendations for Other Services      Precautions / Restrictions Precautions Precautions: None Recall of Precautions/Restrictions: Intact Required Braces or Orthoses: Sling Restrictions Weight Bearing Restrictions Per Provider Order: No Other Position/Activity Restrictions: RUE sling for comfort but did not use in session       Mobility Bed Mobility Overal bed mobility: Needs Assistance Bed Mobility: Supine to Sit, Sit to Supine     Supine to sit: Modified independent (Device/Increase time) Sit to supine: Modified independent (Device/Increase time)   General bed mobility comments: due to pain    Transfers Overall transfer level: Needs assistance Equipment used: None, 1 person hand held assist Transfers: Sit to/from Stand Sit to Stand: Modified independent (Device/Increase time)                  Balance Overall balance assessment: Needs assistance Sitting-balance support: Feet supported Sitting balance-Leahy Scale: Good     Standing balance support: Single extremity supported Standing balance-Leahy Scale: Fair Standing balance comment: Pt noted that they were mostly superivison but needed min assist to balacne self as moved to quickly and had one LOB in session and needed assist balancing self                           ADL either performed or assessed with clinical judgement   ADL Overall ADL's : Needs assistance/impaired Eating/Feeding: Sitting;Independent   Grooming: Wash/dry hands;Wash/dry face;Oral care;Applying deodorant;Brushing hair;Contact guard assist;Standing;Sitting   Upper Body Bathing: Set up;Sitting   Lower Body Bathing: Contact guard assist;Sit to/from stand   Upper Body Dressing : Set up;Sitting   Lower Body Dressing: Contact guard assist;Sit to/from stand   Toilet Transfer: Contact guard assist;Regular Toilet   Toileting- Clothing Manipulation and Hygiene: Contact guard assist       Functional mobility during ADLs: Contact guard assist      Extremity/Trunk Assessment Upper Extremity Assessment Upper Extremity Assessment: RUE deficits/detail RUE Deficits / Details: S/p R transaxillary 1st rib resection 8/19,  venous thoracic outlet syndrome. Pt was now able to compelte functional taskas such as hygiene, dressing with no assist but limited AROM to about 95 deg shoulder flexion RUE Sensation: decreased light touch RUE Coordination: decreased gross motor   Lower Extremity Assessment Lower Extremity Assessment: Defer to PT evaluation        Vision   Vision Assessment?: Wears glasses for  reading   Perception Perception Perception: Within Functional Limits   Praxis Praxis Praxis: [facility]   Communication Communication Communication: No apparent difficulties   Cognition Arousal: Alert Behavior During Therapy: WFL for tasks  assessed/performed Cognition: No apparent impairments                               Following commands: Intact        Cueing   Cueing Techniques: Verbal cues  Exercises      Shoulder Instructions       General Comments      Pertinent Vitals/ Pain       Pain Assessment Pain Assessment: Faces Faces Pain Scale: Hurts worst Pain Location: Stomach Pain Descriptors / Indicators: Aching, Guarding, Cramping Pain Intervention(s): Limited activity within patient's tolerance, Monitored during session, Repositioned (but not consistent in session)  Home Living                                          Prior Functioning/Environment              Frequency  Min 2X/week        Progress Toward Goals  OT Goals(current goals can now be found in the care plan section)  Progress towards OT goals: Progressing toward goals  Acute Rehab OT Goals Patient Stated Goal: to have less pain OT Goal Formulation: With patient Time For Goal Achievement: 08/30/24 Potential to Achieve Goals: Good ADL Goals Pt Will Perform Grooming: with modified independence;standing Pt Will Perform Upper Body Bathing: Independently Pt Will Perform Lower Body Bathing: with modified independence Pt Will Perform Upper Body Dressing: Independently Pt Will Perform Lower Body Dressing: with modified independence Pt Will Transfer to Toilet: with modified independence;ambulating;regular height toilet Pt Will Perform Toileting - Clothing Manipulation and hygiene: with modified independence;sitting/lateral leans;sit to/from stand Pt/caregiver will Perform Home Exercise Program: Increased ROM;Right Upper extremity  Plan      Co-evaluation                 AM-PAC OT 6 Clicks Daily Activity     Outcome Measure   Help from another person eating meals?: None Help from another person taking care of personal grooming?: None Help from another person toileting, which includes  using toliet, bedpan, or urinal?: A Little Help from another person bathing (including washing, rinsing, drying)?: A Little Help from another person to put on and taking off regular upper body clothing?: None Help from another person to put on and taking off regular lower body clothing?: A Little 6 Click Score: 21    End of Session Equipment Utilized During Treatment: Gait belt  OT Visit Diagnosis: Unsteadiness on feet (R26.81);Muscle weakness (generalized) (M62.81);Pain Pain - Right/Left: Right Pain - part of body: Arm   Activity Tolerance Other (comment) (Pt felt as if they were going to vomit but did not, cramping often in session and had a loose BM)   Patient Left in bed;with call bell/phone within reach;with nursing/sitter in room   Nurse Communication Mobility status        Time: 9093-9047 OT Time Calculation (min): 46 min  Charges: OT General Charges $OT Visit: 1 Visit OT Treatments $Self Care/Home Management : 38-52 mins  Warrick POUR OTR/L  Acute Rehab Services  [phone] office number   Warrick Berber 08/22/2024, 10:00 AM

## 2024-08-25 NOTE — Discharge Summary (Signed)
 Physician Discharge Summary  Patient ID: [name] MRN: [id number] DOB/AGE: August 19, 1961 63 y.o.  Admit date: 08/15/2024 Discharge date: 08/22/2024  Admission Diagnosis: Venous and neurogenic thoracic outlet syndrome  Discharge Diagnoses:  1.  Venous and neurogenic thoracic outlet syndrome 2.  Gastroparesis  Secondary Diagnoses: Principal Problem:   Thoracic outlet syndrome   Procedures: 1.  Transaxillary right first rib resection 2.  Percutaneous ultrasound-guided cannulation right basilic vein 3.  Right upper extremity venogram  Discharged Condition: poor  Hospital Course: 63 year old female admitted for right transaxillary first rib resection due to venous and neurogenic thoracic outlet syndrome.  She recovered very well from the surgery was having pain the postoperative day #1 but postoperative day #2 the pain in the right upper extremity was much improved.  Unfortunately she developed gastroparesis with severe pain underwent CT scanning and ultimately this mostly resolved to her baseline and she was discharged on postoperative day #7.  Consults:  Physical therapy  Significant Diagnostic Studies: CBC    Latest Ref Rng & Units 08/20/2024    4:04 AM 08/16/2024    8:56 AM 08/15/2024    4:39 PM  CBC  WBC 4.0 - 10.5 K/uL 7.4  7.0  13.2   Hemoglobin 12.0 - 15.0 g/dL 9.4  88.1  88.9   Hematocrit 36.0 - 46.0 % 29.4  37.7  33.7   Platelets 150 - 400 K/uL 240  186  209      BMET @LASTCHEMISTRY @  COAG Lab Results  Component Value Date   INR 1.0 08/15/2024   INR 1.3 (H) 08/11/2024   INR 1.10 09/02/2011   No results found for: PTT  Disposition: Home  Allergies as of 08/22/2024       Reactions   Nitrofurantoin Hives   Crestor  [rosuvastatin  Calcium ] Other (See Comments)   Generalized cramps   Bisacodyl  Other (See Comments)   Makes patient feel like she is having cramps   Codeine Itching   Iron  Sucrose Other (See Comments)   Flushing- required  benadryl  and solu medrol    Monosodium Glutamate Other (See Comments)   Cluster migraines   Scopolamine Hbr Other (See Comments)   Cluster migraines, impaired vision   Morphine  Rash, Hives   Other Hives, Itching, Swelling, Rash, Other (See Comments)   Cigarette smoke Causes Migraines         Medication List     TAKE these medications    acyclovir  ointment 5 % Commonly known as: ZOVIRAX  Apply to affected area(s) every 4 hours while awake for 5  days , then as needed What changed:  how much to take how to take this when to take this reasons to take this additional instructions   ALPRAZolam  1 MG tablet Commonly known as: XANAX  Take 1 tablet (1 mg total) by mouth 2 (two) times daily.   aspirin  81 MG chewable tablet Chew 81 mg by mouth in the morning.   BLINK TEARS OP Take 1 capsule by mouth at bedtime. Blink NutriTears Supplement for Dry Eyes   botulinum toxin Type A  200 units injection Commonly known as: BOTOX  Inject 155 units IM into multiple site in the face,neck and head once every 90 days   cyanocobalamin  1000 MCG/ML injection Commonly known as: VITAMIN B12 ADMINISTER 1 ML(1000 MCG) IN THE MUSCLE EVERY 30 DAYS   cyclobenzaprine  10 MG tablet Commonly known as: FLEXERIL  Take 1 tablet (10 mg total) by mouth 2 (two) times daily as needed for muscle spasms. What changed: when to take  this   cycloSPORINE  0.05 % ophthalmic emulsion Commonly known as: RESTASIS  Place 1 drop into both eyes 2 (two) times daily as needed (dry eyes).   DULoxetine  60 MG capsule Commonly known as: CYMBALTA  TAKE 1 CAPSULE(60 MG) BY MOUTH DAILY   ezetimibe  10 MG tablet Commonly known as: ZETIA  TAKE 1 TABLET(10 MG) BY MOUTH DAILY What changed: See the new instructions.   fenofibrate  48 MG tablet Commonly known as: TRICOR  TAKE 1 TABLET(48 MG) BY MOUTH DAILY What changed: See the new instructions.   HYDROmorphone  4 MG tablet Commonly known as: DILAUDID  Take 4 mg by mouth 3 (three)  times daily.   ICAPS AREDS 2 PO Take 1 capsule by mouth at bedtime.   Intrarosa 6.5 MG Inst Generic drug: Prasterone Place 1 suppository vaginally 2 (two) times a week. Mondays & Fridays   levothyroxine  125 MCG tablet Commonly known as: SYNTHROID  TAKE 1 TABLET(125 MCG) BY MOUTH DAILY BEFORE BREAKFAST   linaclotide  290 MCG Caps capsule Commonly known as: Linzess  Take 1 capsule (290 mcg total) by mouth daily before breakfast.   magnesium  oxide 400 (240 Mg) MG tablet Commonly known as: MAG-OX TAKE 1 TABLET BY MOUTH FOUR TIMES DAILY What changed: when to take this   MegaRed Omega-3 Krill Oil 500 MG Caps Take 500 mg by mouth at bedtime.   metoCLOPramide  10 MG tablet Commonly known as: REGLAN  Take 1 tablet (10 mg total) by mouth every 6 (six) hours. What changed:  when to take this reasons to take this   metoprolol  succinate 25 MG 24 hr tablet Commonly known as: TOPROL -XL Take 25 mg by mouth daily as needed (elevated blood pressure). Patient states takes this meds for her heart.   naloxone 4 MG/0.1ML Liqd nasal spray kit Commonly known as: NARCAN Place 1 spray into the nose as needed (opioid reversal).   Nurtec 75 MG Tbdp Generic drug: Rimegepant Sulfate Medication Samples have been provided to the patient.  Drug name: Nurtec     Strength: 75mg         Qty: 2 boxes  LOT: [id number]  Exp.Date: 05/2025  Dosing instructions:   The patient has been instructed regarding the correct time, dose, and frequency of taking this medication, including desired effects and most common side effects.   Mahina A Allen 3:04 PM 06/23/2024   omeprazole  40 MG capsule Commonly known as: PRILOSEC TAKE 1 CAPSULE(40 MG) BY MOUTH IN THE MORNING AND AT BEDTIME   OVER THE COUNTER MEDICATION Take 3 capsules by mouth in the morning. Nutrafol Hair Wellness supplement   potassium chloride  10 MEQ tablet Commonly known as: KLOR-CON  M TAKE 1 TABLET(10 MEQ) BY MOUTH TWICE DAILY   pravastatin  20 MG  tablet Commonly known as: PRAVACHOL  TAKE 1 TABLET(20 MG) BY MOUTH DAILY What changed: See the new instructions.   Premarin  vaginal cream Generic drug: conjugated estrogens  INSERT pea sized amount three times weekly intravaginally What changed:  how much to take how to take this when to take this additional instructions   Promethegan 25 MG suppository Generic drug: promethazine  Place 25 mg rectally every 6 (six) hours as needed for nausea or vomiting.   Vitamin D  (Ergocalciferol ) 1.25 MG (50000 UNIT) Caps capsule Commonly known as: DRISDOL  Take 50,000 Units by mouth every 30 (thirty) days.   Xarelto  20 MG Tabs tablet Generic drug: rivaroxaban  TAKE 1 TABLET(20 MG) BY MOUTH DAILY        Follow-up Information     Sheree Penne Bruckner, MD Follow up in 3 week(s).  Specialties: Vascular Surgery, Cardiology Why: Office will call to arrange your appt(s) Contact information: [address] [place] [address] [phone]         [facility]  Follow up.   Why: HH arranged with [facility] office referral- they will contact you to follow up and schedule Contact information: [address] [place] [address] [phone]                 Sherron Mummert C. Sheree, MD [facility] Office: [phone] Pager: [phone]  08/25/2024, 10:02 AM

## 2024-08-29 ENCOUNTER — Ambulatory Visit

## 2024-08-30 ENCOUNTER — Encounter: Admitting: Vascular Surgery

## 2024-09-08 ENCOUNTER — Ambulatory Visit: Admitting: Neurology

## 2024-09-08 ENCOUNTER — Other Ambulatory Visit: Payer: Self-pay | Admitting: Medical Genetics

## 2024-09-13 ENCOUNTER — Ambulatory Visit (INDEPENDENT_AMBULATORY_CARE_PROVIDER_SITE_OTHER): Admitting: Gastroenterology

## 2024-09-18 ENCOUNTER — Ambulatory Visit: Admitting: Urology

## 2024-09-19 ENCOUNTER — Ambulatory Visit
Admission: RE | Admit: 2024-09-19 | Discharge: 2024-09-19 | Disposition: A | Discharge status: Home / Self Care | Source: Ambulatory Visit | Attending: Family Medicine | Admitting: Family Medicine

## 2024-09-19 DIAGNOSIS — Z1231 Encounter for screening mammogram for malignant neoplasm of breast: Secondary | ICD-10-CM

## 2024-09-21 ENCOUNTER — Other Ambulatory Visit: Payer: Self-pay | Admitting: Family Medicine

## 2024-09-21 DIAGNOSIS — N6459 Other signs and symptoms in breast: Secondary | ICD-10-CM

## 2024-09-22 ENCOUNTER — Encounter: Payer: Self-pay | Admitting: Neurology

## 2024-09-22 ENCOUNTER — Ambulatory Visit: Admitting: Neurology

## 2024-09-27 ENCOUNTER — Encounter (INDEPENDENT_AMBULATORY_CARE_PROVIDER_SITE_OTHER): Payer: Self-pay

## 2024-09-27 ENCOUNTER — Ambulatory Visit (INDEPENDENT_AMBULATORY_CARE_PROVIDER_SITE_OTHER): Admitting: Gastroenterology

## 2024-10-02 ENCOUNTER — Other Ambulatory Visit: Payer: Self-pay | Admitting: Physician Assistant

## 2024-10-02 ENCOUNTER — Other Ambulatory Visit: Payer: Self-pay | Admitting: Family Medicine

## 2024-10-02 DIAGNOSIS — E538 Deficiency of other specified B group vitamins: Secondary | ICD-10-CM

## 2024-10-04 ENCOUNTER — Ambulatory Visit: Attending: Vascular Surgery | Admitting: Vascular Surgery

## 2024-10-04 ENCOUNTER — Encounter: Payer: Self-pay | Admitting: Vascular Surgery

## 2024-10-04 VITALS — BP 116/81 | HR 87 | Temp 98.0°F | Ht 64.5 in | Wt 199.0 lb

## 2024-10-04 DIAGNOSIS — G540 Brachial plexus disorders: Secondary | ICD-10-CM

## 2024-10-04 NOTE — Progress Notes (Signed)
     Subjective:     Patient ID: [name], female   DOB: [birth year], 63 y.o.   MRN: [id number]  HPI 63 year old female follows up after transaxillary first rib resection on the right for venous and neurogenic thoracic outlet syndrome.  She has recovered very well except for mild neck pain.  She recently had injury to her thumb while just moving her hand she states that this has been her major right upper extremity limitation but that most of her symptoms of neck, chest and shoulder pain have resolved.   Review of Systems As above    Objective:   Physical Exam Vitals:   10/04/24 1346  BP: 116/81  Pulse: 87  Temp: 98 F (36.7 C)  SpO2: 97%      Assessment/plan:     63 year old female status post transaxillary right first rib resection for venous and neurogenic thoracic outlet syndrome.  She did have small injury to her right thumb but otherwise recovering well.  She can see me on an as-needed basis.        Lailie Smead C. Sheree, MD [facility] Office: [phone] Pager: [phone]

## 2024-10-05 ENCOUNTER — Ambulatory Visit: Admitting: Family Medicine

## 2024-10-06 ENCOUNTER — Ambulatory Visit: Admitting: Neurology

## 2024-10-06 DIAGNOSIS — G43E09 Chronic migraine with aura, not intractable, without status migrainosus: Secondary | ICD-10-CM | POA: Diagnosis not present

## 2024-10-06 MED ORDER — ONABOTULINUMTOXINA 100 UNITS IJ SOLR
200.0000 [IU] | Freq: Once | INTRAMUSCULAR | Status: AC
  Administered 2024-10-06: 155 [IU] via INTRAMUSCULAR

## 2024-10-06 NOTE — Progress Notes (Signed)

## 2024-10-06 NOTE — Progress Notes (Signed)
 Medication Samples have been provided to the patient.  Drug name: Holland       Strength: 100 mg        Qty: 4  LOT: [id number]  Exp.Date: 12/26  Dosing instructions: as needed  The patient has been instructed regarding the correct time, dose, and frequency of taking this medication, including desired effects and most common side effects.   [name] 2:59 PM 10/06/2024

## 2024-10-10 ENCOUNTER — Ambulatory Visit
Admission: RE | Admit: 2024-10-10 | Discharge: 2024-10-10 | Disposition: A | Source: Ambulatory Visit | Attending: Family Medicine | Admitting: Family Medicine

## 2024-10-10 ENCOUNTER — Other Ambulatory Visit: Payer: Self-pay | Admitting: Family Medicine

## 2024-10-10 ENCOUNTER — Ambulatory Visit
Admission: RE | Admit: 2024-10-10 | Discharge: 2024-10-10 | Disposition: A | Discharge status: Home / Self Care | Source: Ambulatory Visit | Attending: Family Medicine | Admitting: Family Medicine

## 2024-10-10 DIAGNOSIS — N6459 Other signs and symptoms in breast: Secondary | ICD-10-CM

## 2024-10-11 ENCOUNTER — Encounter (INDEPENDENT_AMBULATORY_CARE_PROVIDER_SITE_OTHER): Payer: Self-pay | Admitting: Gastroenterology

## 2024-10-19 ENCOUNTER — Other Ambulatory Visit: Payer: Self-pay | Admitting: Family Medicine

## 2024-10-23 ENCOUNTER — Other Ambulatory Visit: Payer: Self-pay | Admitting: Family Medicine

## 2024-10-23 ENCOUNTER — Encounter: Payer: Self-pay | Admitting: Family Medicine

## 2024-10-30 ENCOUNTER — Encounter: Payer: Self-pay | Admitting: Radiology

## 2024-11-07 ENCOUNTER — Telehealth: Payer: Self-pay

## 2024-11-07 ENCOUNTER — Other Ambulatory Visit: Payer: Self-pay | Admitting: Family Medicine

## 2024-11-07 NOTE — Telephone Encounter (Signed)
 Pt needing refill of Xanax 

## 2024-11-08 NOTE — Telephone Encounter (Signed)
 Hs 1 refill already remaining, 6 months ordered in May, 2025

## 2024-11-10 ENCOUNTER — Telehealth: Payer: Self-pay

## 2024-11-10 ENCOUNTER — Ambulatory Visit: Admitting: Family Medicine

## 2024-11-10 NOTE — Telephone Encounter (Signed)
 Copied from CRM #[id number]. Topic: Clinical - Medication Question >> Nov 10, 2024  3:09 PM Larissa RAMAN wrote: Reason for CRM: Garrel, clinical pharmacist with [employer], calling to inform PCP of drug interactions with the following medications:  ALPRAZolam  (XANAX ) 1 MG tablet and HYDROmorphone  (DILAUDID ) 4 MG tablet   Callback # [phone] ext [phone], Secure VM

## 2024-11-13 ENCOUNTER — Ambulatory Visit: Admitting: Neurology

## 2024-11-15 ENCOUNTER — Encounter: Payer: Self-pay | Admitting: Neurology

## 2024-11-15 ENCOUNTER — Encounter: Payer: Self-pay | Admitting: Family Medicine

## 2024-11-15 MED ORDER — ALPRAZOLAM 1 MG PO TABS
1.0000 mg | ORAL_TABLET | Freq: Two times a day (BID) | ORAL | 2 refills | Status: AC
Start: 2024-11-15 — End: ?

## 2024-11-16 ENCOUNTER — Ambulatory Visit (INDEPENDENT_AMBULATORY_CARE_PROVIDER_SITE_OTHER): Admitting: Family

## 2024-11-16 ENCOUNTER — Ambulatory Visit: Payer: Self-pay

## 2024-11-16 ENCOUNTER — Encounter: Payer: Self-pay | Admitting: Family

## 2024-11-16 VITALS — BP 115/85 | HR 87 | Temp 97.5°F | Ht 64.5 in | Wt 202.2 lb

## 2024-11-16 DIAGNOSIS — J029 Acute pharyngitis, unspecified: Secondary | ICD-10-CM | POA: Diagnosis not present

## 2024-11-16 DIAGNOSIS — J069 Acute upper respiratory infection, unspecified: Secondary | ICD-10-CM

## 2024-11-16 LAB — CULTURE, GROUP A STREP

## 2024-11-16 LAB — RAPID STREP SCREEN (MED CTR MEBANE ONLY): Strep Gp A Ag, IA W/Reflex: NEGATIVE

## 2024-11-16 MED ORDER — LIDOCAINE VISCOUS HCL 2 % MT SOLN: 15.0000 mL | OROMUCOSAL | 0 refills | Status: AC | PRN

## 2024-11-16 MED ORDER — PROMETHAZINE-DM 6.25-15 MG/5ML PO SYRP: 5.0000 mL | ORAL_SOLUTION | Freq: Three times a day (TID) | ORAL | 0 refills | Status: AC | PRN

## 2024-11-16 NOTE — Telephone Encounter (Signed)
 FYI Only or Action Required?: FYI only for provider: appointment scheduled on 11/20.  Patient was last seen in primary care on 07/19/2024 by Antonetta Rollene BRAVO, MD.  Called Nurse Triage reporting Sore Throat.  Symptoms began yesterday.  Interventions attempted: Rest, hydration, or home remedies.  Symptoms are: gradually worsening.  Triage Disposition: See Physician Within 24 Hours  Patient/caregiver understands and will follow disposition?: Yes  Copied from CRM #[id number]. Topic: Clinical - Red Word Triage >> Nov 16, 2024 12:14 PM Victoria B wrote: [facility] that prompted transfer to Nurse Triage: patient called in has burning in her throat and back and forth with runny nose and stopped up Reason for Disposition  [1] Pus on tonsils (back of throat) AND [2] fever AND [3] swollen neck lymph nodes (glands)  Answer Assessment - Initial Assessment Questions Started yesterday with burning throat. Had geographic tongue a few days before with white balls on the tongue. Runny nose and mild cough worse at night. Took some left over promethazine  cough syrup and has used the rest of it. Husband looked in throat while on with RN- notes white patching spots on tonsils. Denies SOB, Fever,CP, or dizziness OV found with [facility] DOD- ED/UC precautions understood.   1. ONSET: When did the throat start hurting? (Hours or days ago)      Yesterday  2. SEVERITY: How bad is the sore throat? (Scale 1-10; mild, moderate or severe)     6-7/10 3. STREP EXPOSURE: Has there been any exposure to strep within the past week? If Yes, ask: What type of contact occurred?      Denies known exposure  4.  VIRAL SYMPTOMS: Are there any symptoms of a cold, such as a runny nose, cough, hoarse voice or red eyes?      Runny nose, mild cough 5. FEVER: Do you have a fever? If Yes, ask: What is your temperature, how was it measured, and when did it start?     Denies fever 6. PUS ON THE TONSILS: Is there pus on the  tonsils in the back of your throat?     Patchy/ white spots on throat 7. OTHER SYMPTOMS: Do you have any other symptoms? (e.g., difficulty breathing, headache, rash)     Mild cough, and runny nose  Protocols used: Sore Throat-A-AH

## 2024-11-16 NOTE — Patient Instructions (Signed)
 Upper Respiratory Infection, Adult An upper respiratory infection (URI) is a common viral infection of the nose, throat, and upper air passages that lead to the lungs. The most common type of URI is the common cold. URIs usually get better on their own, without medical treatment. What are the causes? A URI is caused by a virus. You may catch a virus by: Breathing in droplets from an infected person's cough or sneeze. Touching something that has been exposed to the virus (is contaminated) and then touching your mouth, nose, or eyes. What increases the risk? You are more likely to get a URI if: You are very young or very old. You have close contact with others, such as at work, school, or a health care facility. You smoke. You have long-term (chronic) heart or lung disease. You have a weakened disease-fighting system (immune system). You have nasal allergies or asthma. You are experiencing a lot of stress. You have poor nutrition. What are the signs or symptoms? A URI usually involves some of the following symptoms: Runny or stuffy (congested) nose. Cough. Sneezing. Sore throat. Headache. Fatigue. Fever. Loss of appetite. Pain in your forehead, behind your eyes, and over your cheekbones (sinus pain). Muscle aches. Redness or irritation of the eyes. Pressure in the ears or face. How is this diagnosed? This condition may be diagnosed based on your medical history and symptoms, and a physical exam. Your health care provider may use a swab to take a mucus sample from your nose (nasal swab). This sample can be tested to determine what virus is causing the illness. How is this treated? URIs usually get better on their own within 7-10 days. Medicines cannot cure URIs, but your health care provider may recommend certain medicines to help relieve symptoms, such as: Over-the-counter cold medicines. Cough suppressants. Coughing is a type of defense against infection that helps to clear the  respiratory system, so take these medicines only as recommended by your health care provider. Fever-reducing medicines. Follow these instructions at home: Activity Rest as needed. If you have a fever, stay home from work or school until your fever is gone or until your health care provider says your URI cannot spread to other people (is no longer contagious). Your health care provider may have you wear a face mask to prevent your infection from spreading. Relieving symptoms Gargle with a mixture of salt and water 3-4 times a day or as needed. To make salt water, completely dissolve -1 tsp (3-6 g) of salt in 1 cup (237 mL) of warm water. Use a cool-mist humidifier to add moisture to the air. This can help you breathe more easily. Eating and drinking  Drink enough fluid to keep your urine pale yellow. Eat soups and other clear broths. General instructions  Take over-the-counter and prescription medicines only as told by your health care provider. These include cold medicines, fever reducers, and cough suppressants. Do not use any products that contain nicotine or tobacco. These products include cigarettes, chewing tobacco, and vaping devices, such as e-cigarettes. If you need help quitting, ask your health care provider. Stay away from secondhand smoke. Stay up to date on all immunizations, including the yearly (annual) flu vaccine. Keep all follow-up visits. This is important. How to prevent the spread of infection to others URIs can be contagious. To prevent the infection from spreading: Wash your hands with soap and water for at least 20 seconds. If soap and water are not available, use hand sanitizer. Avoid touching your mouth,  face, eyes, or nose. Cough or sneeze into a tissue or your sleeve or elbow instead of into your hand or into the air.  Contact a health care provider if: You are getting worse instead of better. You have a fever or chills. Your mucus is brown or red. You have  yellow or brown discharge coming from your nose. You have pain in your face, especially when you bend forward. You have swollen neck glands. You have pain while swallowing. You have white areas in the back of your throat. Get help right away if: You have shortness of breath that gets worse. You have severe or persistent: Headache. Ear pain. Sinus pain. Chest pain. You have chronic lung disease along with any of the following: Making high-pitched whistling sounds when you breathe, most often when you breathe out (wheezing). Prolonged cough (more than 14 days). Coughing up blood. A change in your usual mucus. You have a stiff neck. You have changes in your: Vision. Hearing. Thinking. Mood. These symptoms may be an emergency. Get help right away. Call 911. Do not wait to see if the symptoms will go away. Do not drive yourself to the hospital. Summary An upper respiratory infection (URI) is a common infection of the nose, throat, and upper air passages that lead to the lungs. A URI is caused by a virus. URIs usually get better on their own within 7-10 days. Medicines cannot cure URIs, but your health care provider may recommend certain medicines to help relieve symptoms. This information is not intended to replace advice given to you by your health care provider. Make sure you discuss any questions you have with your health care provider. Document Revised: 07/16/2021 Document Reviewed: 07/16/2021 Elsevier Patient Education  2024 [employer].

## 2024-11-16 NOTE — Progress Notes (Signed)
 Subjective:    Patient ID: [name], female    DOB: [birth year], 62 y.o.   MRN: [id number]  Chief Complaint  Patient presents with   Sore Throat   PT presents to the office today with sore throat that started yesterday. Took two COVID tests at home that were negative.  Sore Throat  This is a new problem. The current episode started yesterday. The problem has been gradually worsening. There has been no fever. The pain is at a severity of 6/10. The pain is mild. Associated symptoms include congestion, coughing, ear pain (right), headaches and trouble swallowing. Pertinent negatives include no shortness of breath or swollen glands. She has tried acetaminophen  for the symptoms. The treatment provided mild relief.      Review of Systems  HENT:  Positive for congestion, ear pain (right) and trouble swallowing.   Respiratory:  Positive for cough. Negative for shortness of breath.   Neurological:  Positive for headaches.  All other systems reviewed and are negative.   Social History   Socioeconomic History   Marital status: Married    Spouse name: Not on file   Number of children: Not on file   Years of education: Not on file   Highest education level: 12th grade  Occupational History   Not on file  Tobacco Use   Smoking status: Never   Smokeless tobacco: Never  Vaping Use   Vaping status: Never Used  Substance and Sexual Activity   Alcohol use: No   Drug use: No   Sexual activity: Not Currently    Birth control/protection: Surgical    Comment: hysterectomy  Other Topics Concern   Not on file  Social History Narrative   Right handed   Social Drivers of Health   Financial Resource Strain: Low Risk  (11/06/2024)   Overall Financial Resource Strain (CARDIA)    Difficulty of Paying Living Expenses: Not hard at all  Food Insecurity: No Food Insecurity (11/06/2024)   Hunger Vital Sign    Worried About Running Out of Food in the Last Year: Never true    Ran Out  of Food in the Last Year: Never true  Transportation Needs: No Transportation Needs (11/06/2024)   PRAPARE - Administrator, Civil Service (Medical): No    Lack of Transportation (Non-Medical): No  Physical Activity: Unknown (11/06/2024)   Exercise Vital Sign    Days of Exercise per Week: Patient declined    Minutes of Exercise per Session: Not on file  Stress: No Stress Concern Present (11/06/2024)   [employer] of Occupational Health - Occupational Stress Questionnaire    Feeling of Stress: Only a little  Social Connections: Unknown (11/06/2024)   Social Connection and Isolation Panel    Frequency of Communication with Friends and Family: More than three times a week    Frequency of Social Gatherings with Friends and Family: More than three times a week    Attends Religious Services: 1 to 4 times per year    Active Member of [employer] or Organizations: Patient declined    Attends Engineer, Structural: Not on file    Marital Status: Married   Family History  Problem Relation Age of Onset   Stroke Mother    Migraines Mother    Congestive Heart Failure Mother    Diabetes Mother    Heart disease Mother    Hypertension Mother    Other Mother        ketoacidosis  Arthritis Mother    Asthma Mother    COPD Mother    Kidney disease Mother    Miscarriages / Stillbirths Mother    Vision loss Mother    Heart disease Father    Hyperlipidemia Father    Hypertension Father    Alcohol abuse Father    Congestive Heart Failure Father    Colon cancer Maternal Aunt    Breast cancer Maternal Aunt    Breast cancer Maternal Aunt    Congestive Heart Failure Maternal Aunt    Cancer Maternal Aunt    Heart attack Maternal Aunt    Breast cancer Maternal Aunt    Cancer Maternal Aunt    Rheum arthritis Maternal Aunt    Breast cancer Maternal Aunt    Breast cancer Maternal Aunt    Cancer Maternal Uncle        mets   Prostate cancer Maternal Uncle    Migraines  Maternal Grandmother    Anemia Maternal Grandmother    Bone cancer Maternal Grandfather        mets   Clotting disorder Maternal Grandfather    Migraines Daughter    Supraventricular tachycardia Daughter    Ovarian cancer Cousin 19   Breast cancer Cousin    Asthma Paternal Grandmother         Objective:   Physical Exam Vitals reviewed.  Constitutional:      General: She is not in acute distress.    Appearance: She is well-developed. She is obese.  HENT:     Head: Normocephalic and atraumatic.     Right Ear: Tympanic membrane normal.     Left Ear: Tympanic membrane normal.     Mouth/Throat:     Comments: Erythemas  Eyes:     Pupils: Pupils are equal, round, and reactive to light.  Neck:     Thyroid : No thyromegaly.  Cardiovascular:     Rate and Rhythm: Normal rate and regular rhythm.     Heart sounds: Normal heart sounds. No murmur heard. Pulmonary:     Effort: Pulmonary effort is normal. No respiratory distress.     Breath sounds: Normal breath sounds. No wheezing.  Abdominal:     General: Bowel sounds are normal. There is no distension.     Palpations: Abdomen is soft.     Tenderness: There is no abdominal tenderness.  Musculoskeletal:        General: No tenderness. Normal range of motion.     Cervical back: Normal range of motion and neck supple.  Skin:    General: Skin is warm and dry.  Neurological:     Mental Status: She is alert and oriented to person, place, and time.     Cranial Nerves: No cranial nerve deficit.     Deep Tendon Reflexes: Reflexes are normal and symmetric.  Psychiatric:        Behavior: Behavior normal.        Thought Content: Thought content normal.        Judgment: Judgment normal.       BP 115/85   Pulse 87   Temp (!) 97.5 F (36.4 C) (Temporal)   Ht 5' 4.5 (1.638 m)   Wt 202 lb 3.2 oz (91.7 kg)   BMI 34.17 kg/m      Assessment & Plan:  [name] comes in today with chief complaint of Sore  Throat   Diagnosis and orders addressed:  1. Sore throat (Primary) - Rapid Strep Screen ([facility]  ONLY)  2. Viral URI with cough - Take meds as prescribed - Use a cool mist humidifier  -Use saline nose sprays frequently -Force fluids -For any cough or congestion  Use plain Mucinex - regular strength or max strength is fine -For fever or aces or pains- take tylenol  or ibuprofen . -Throat lozenges if help -New toothbrush in 3 days Follow up if symptoms worsen or do not improve  - promethazine -dextromethorphan  (PROMETHAZINE -DM) 6.25-15 MG/5ML syrup; Take 5 mLs by mouth 3 (three) times daily as needed for cough.  Dispense: 118 mL; Refill: 0 - lidocaine  (XYLOCAINE ) 2 % solution; Use as directed 15 mLs in the mouth or throat as needed for mouth pain.  Dispense: 100 mL; Refill: 0       Bari Learn, FNP

## 2024-11-17 ENCOUNTER — Encounter: Payer: Self-pay | Admitting: Family Medicine

## 2024-11-17 ENCOUNTER — Ambulatory Visit: Admitting: Neurology

## 2024-11-17 ENCOUNTER — Ambulatory Visit: Payer: Self-pay | Admitting: Family

## 2024-11-20 ENCOUNTER — Other Ambulatory Visit: Payer: Self-pay | Admitting: Family Medicine

## 2024-11-26 NOTE — ED Provider Notes (Signed)
 Emergency Department Provider Note   ED Clinical Impression   Final diagnoses:  Acute bronchitis with bronchospasm (Primary)  Hypoxemia    ED Assessment/Plan    History   Chief Complaint  Patient presents with  . Chest Pain    This 63 year old female with a history of SVT requiring ablation x 3, gastroparesis, breast cancer pulmonary embolism pneumonia arrhythmia thyroid  disease anemia, states she has felt that she has had sinus infection for the past 2 weeks, unrelieved by symptomatic treatment without antibiotics.  She notices nasal congestion and postnasal drip and occasional cough.  Over the past 3 days the patient has noticed increasing shortness of breath and tachycardia, especially on exertion.  She has also had persistent nausea and at least 4 episodes of vomiting.    Past Medical History[1]  Past Surgical History[2]  Family History[3]  Social History[4]  Review of Systems  Constitutional:  Positive for fever (Low-grade).  Gastrointestinal:  Negative for abdominal pain.  Musculoskeletal:  Negative for back pain.  Neurological:  Positive for light-headedness. Negative for syncope.    Physical Exam   BP 115/77   Pulse 84   Temp 36.7 C (98.1 F) (Temporal)   Resp 19   Ht 162.6 cm (5' 4)   Wt 83.8 kg (184 lb 12.8 oz)   SpO2 99%   BMI 31.72 kg/m   Physical Exam Constitutional:      General: She is in acute distress (Mild to moderate).  HENT:     Head: Normocephalic and atraumatic.  Cardiovascular:     Rate and Rhythm: Normal rate and regular rhythm.  Pulmonary:     Effort: Respiratory distress (Minimal) present.     Breath sounds: Rhonchi (Occasional scattered) present.  Abdominal:     Palpations: Abdomen is soft.     Tenderness: There is no abdominal tenderness.  Musculoskeletal:     Right lower leg: No edema.     Left lower leg: No edema.  Skin:    General: Skin is warm and dry.  Neurological:     General: No focal deficit present.      Mental Status: She is alert and oriented to person, place, and time.     ED Course   ED Course as of 11/26/24 1800  Sun Nov 26, 2024  1735 With activity the patient's increased shortness of breath returns.  DuoNeb and IV steroids ordered.  1755 No respiratory distress after nebulizer treatment.  Only occasional rhonchi.  O2 sat 90%     Medical Decision Making Patient presents with persistent lower respiratory symptoms shortness of breath on exertion and tachycardia with a background of PE on long-term Xarelto ; the concern is for bronchitis pneumonia persistent viral illness CHF, less likely ACH.  EKG troponin and BNP were unremarkable.  CBC and electrolytes were unremarkable except for elevated hemoglobin of 16 and creatinine/BUN mild elevation with a GFR of 44.  Lipase and magnesium  are normal X-ray of the chest shows increased interstitial markings especially on the right with mild elevation of the right hemidiaphragm.  On my review this is unchanged from previous x-rays.  CT scan of the chest showed no evidence of pneumonia or CHF.  ABG showed a low O2 of 78 on room air.  DuoNeb and steroids ordered.  Improvement noted. Shared Decision Making: The patient has an MDI for home use and will contact her PCP in the morning to assist in obtaining a nebulizer.  She would rather not consider admission at this time.  Prescription for albuterol  and prednisone  will be given.  Amount and/or Complexity of Data Reviewed Labs: ordered. Radiology: ordered. ECG/medicine tests: ordered.  Risk Prescription drug management.                 [1] Past Medical History: Diagnosis Date  . Anemia   . Breast cancer    (CMS-HCC)   . Fibromyalgia   . Gastric reflux   . Headache   . High cholesterol   . Irregular heart beat   . Pneumonia   . Pulmonary embolism    (CMS-HCC)   . Thyroid  disease   [2] Past Surgical History: Procedure Laterality Date  . APPENDECTOMY     1983  . BREAST  LUMPECTOMY  2006-2007  . CESAREAN SECTION  1986  . CHOLECYSTECTOMY    . HYSTERECTOMY  1987  . JOINT REPLACEMENT    . OTHER SURGICAL HISTORY  2012-2015    bowel fistula surgery  . OTHER SURGICAL HISTORY     bowel blockage   [3] Family History Problem Relation Age of Onset  . Supraventricular tachycardia Other   . Stroke Other   . Cancer Other   . Breast cancer Other   . Ovarian cancer Other   . Cancer Maternal Aunt   . Cancer Maternal Uncle   . Cancer Paternal Uncle   . Cancer Maternal Aunt   . Cancer Maternal Aunt   . Cancer Maternal Uncle   . Cancer Maternal Uncle   . Cancer Cousin   . Cancer Cousin   . Cancer Cousin   . Cancer Cousin   . Cancer Cousin   . Cancer Cousin   . Cancer Cousin   . Cancer Father        SKIN CANCER  [4] Social History Socioeconomic History  . Marital status: Married    Spouse name: [name]  . Number of children: 1  . Highest education level: Some college, no degree  Tobacco Use  . Smoking status: Never  . Smokeless tobacco: Never  Vaping Use  . Vaping status: Never Used  Substance and Sexual Activity  . Alcohol use: No    Alcohol/week: 0.0 standard drinks of alcohol  . Drug use: No  . Sexual activity: Not Currently  Other Topics Concern  . Do you use sunscreen? Yes  . Tanning bed use? Yes  . Are you easily burned? No  . Excessive sun exposure? No  . Blistering sunburns? No   Social Drivers of Health   Food Insecurity: No Food Insecurity (11/06/2024)   Received [facility]   Hunger Vital Sign   . Within the past 12 months, you worried that your food would run out before you got the money to buy more.: Never true   . Within the past 12 months, the food you bought just didn't last and you didn't have money to get more.: Never true  Tobacco Use: Low Risk  (11/16/2024)   Received from [facility]   Patient History   . Smoking Tobacco Use: Never   . Smokeless Tobacco Use: Never  Transportation Needs: No Transportation Needs  (11/06/2024)   Received from [facility] - Transportation   . In the past 12 months, has lack of transportation kept you from medical appointments or from getting medications?: No   . In the past 12 months, has lack of transportation kept you from meetings, work, or from getting things needed for daily living?: No  Alcohol Use: Not At Risk (  09/08/2024)   Received from [facility]   AUDIT-C   . Q1: How often do you have a drink containing alcohol?: Never   . Q2: How many drinks containing alcohol do you have on a typical day when you are drinking?: Patient does not drink   . Q3: How often do you have six or more drinks on one occasion?: Never  Housing: Low Risk (07/10/2024)   Housing   . Within the past 12 months, have you ever stayed: outside, in a car, in a tent, in an overnight shelter, or temporarily in someone else's home (i.e. couch-surfing)?: No   . Are you worried about losing your housing?: No  Physical Activity: Unknown (11/06/2024)   Received from [facility]   Exercise Vital Sign   . On average, how many days per week do you engage in moderate to strenuous exercise (like a brisk walk)?: Patient declined  Utilities: Not At Risk (08/15/2024)   Received from [facility] Utilities   . In the past 12 months has the electric, gas, oil, or water  company threatened to shut off services in your home?: No  Stress: No Stress Concern Present (11/06/2024)   Received from [facility] of Occupational Health - Occupational Stress Questionnaire   . Do you feel stress - tense, restless, nervous, or anxious, or unable to sleep at night because your mind is troubled all the time - these days?: Only a little  Interpersonal Safety: Not At Risk (07/10/2024)   Interpersonal Safety   . Unsafe Where You Currently Live: No   . Physically Hurt by Anyone: No   . Abused by Anyone: No  Substance Use: Low Risk (07/06/2024)   Substance Use   . In the past  year, how often have you used prescription drugs for non-medical reasons?: Never   . In the past year, how often have you used illegal drugs?: Never   . In the past year, have you used any substance for non-medical reasons?: No  Social Connections: Unknown (11/06/2024)   Received from [facility]   Social Connection and Isolation Panel   . In a typical week, how many times do you talk on the phone with family, friends, or neighbors?: More than three times a week   . How often do you get together with friends or relatives?: More than three times a week   . How often do you attend church or religious services?: 1 to 4 times per year   . Do you belong to any clubs or organizations such as church groups, unions, fraternal or athletic groups, or school groups?: Patient declined   . Are you married, widowed, divorced, separated, never married, or living with a partner?: Married  Physicist, Medical Strain: Low Risk  (11/06/2024)   Received from [facility]   Overall Financial Resource Strain (CARDIA)   . How hard is it for you to pay for the very basics like food, housing, medical care, and heating?: Not hard at all  Health Literacy: Medium Risk (07/10/2024)   Health Literacy   . : Rarely  Internet Connectivity: No Internet connectivity concern identified (07/10/2024)   Internet Connectivity   . Do you have access to internet services: Yes   . How do you connect to the internet: Personal Device at home   . Is your internet connection strong enough for you to watch video on your device without major problems?: Yes   . Do  you have enough data to get through the month?: Yes   . Does at least one of the devices have a camera that you can use for video chat?: Yes   Rosamond Lyndy Beagle, MD 11/26/24 1800

## 2024-11-27 ENCOUNTER — Encounter: Payer: Self-pay | Admitting: Family Medicine

## 2024-11-27 ENCOUNTER — Other Ambulatory Visit: Payer: Self-pay

## 2024-11-27 ENCOUNTER — Telehealth: Payer: Self-pay

## 2024-11-27 DIAGNOSIS — J069 Acute upper respiratory infection, unspecified: Secondary | ICD-10-CM

## 2024-11-27 MED ORDER — IPRATROPIUM-ALBUTEROL 0.5-2.5 (3) MG/3ML IN SOLN: 3.0000 mL | Freq: Four times a day (QID) | RESPIRATORY_TRACT | 0 refills | Status: DC | PRN

## 2024-11-27 MED ORDER — UNABLE TO FIND: 0 refills | Status: DC

## 2024-11-27 NOTE — Telephone Encounter (Signed)
 Copied from CRM #[id number]. Topic: Clinical - Order For Equipment >> Nov 27, 2024 12:24 PM Leonette P wrote: Reason for CRM: patient called saying she was in the ER yesterday and the doctor wrote her an order for a nebulizer.  She said he told her that she may have a hard time getting one with his script.  She is going to check with he [facility] to see.  She has appt on the 11th.  She would like to be seen sooner if possible  [phone]

## 2024-11-28 ENCOUNTER — Inpatient Hospital Stay: Attending: Physician Assistant

## 2024-11-28 DIAGNOSIS — G540 Brachial plexus disorders: Secondary | ICD-10-CM | POA: Diagnosis not present

## 2024-11-28 DIAGNOSIS — Z8261 Family history of arthritis: Secondary | ICD-10-CM | POA: Insufficient documentation

## 2024-11-28 DIAGNOSIS — D509 Iron deficiency anemia, unspecified: Secondary | ICD-10-CM | POA: Diagnosis present

## 2024-11-28 DIAGNOSIS — Z8419 Family history of other disorders of kidney and ureter: Secondary | ICD-10-CM | POA: Insufficient documentation

## 2024-11-28 DIAGNOSIS — R197 Diarrhea, unspecified: Secondary | ICD-10-CM | POA: Diagnosis not present

## 2024-11-28 DIAGNOSIS — F5089 Other specified eating disorder: Secondary | ICD-10-CM | POA: Insufficient documentation

## 2024-11-28 DIAGNOSIS — Z811 Family history of alcohol abuse and dependence: Secondary | ICD-10-CM | POA: Insufficient documentation

## 2024-11-28 DIAGNOSIS — Z825 Family history of asthma and other chronic lower respiratory diseases: Secondary | ICD-10-CM | POA: Insufficient documentation

## 2024-11-28 DIAGNOSIS — Z7901 Long term (current) use of anticoagulants: Secondary | ICD-10-CM | POA: Insufficient documentation

## 2024-11-28 DIAGNOSIS — Z8701 Personal history of pneumonia (recurrent): Secondary | ICD-10-CM | POA: Insufficient documentation

## 2024-11-28 DIAGNOSIS — Z821 Family history of blindness and visual loss: Secondary | ICD-10-CM | POA: Insufficient documentation

## 2024-11-28 DIAGNOSIS — K3184 Gastroparesis: Secondary | ICD-10-CM | POA: Diagnosis not present

## 2024-11-28 DIAGNOSIS — Z833 Family history of diabetes mellitus: Secondary | ICD-10-CM | POA: Insufficient documentation

## 2024-11-28 DIAGNOSIS — E559 Vitamin D deficiency, unspecified: Secondary | ICD-10-CM | POA: Insufficient documentation

## 2024-11-28 DIAGNOSIS — K625 Hemorrhage of anus and rectum: Secondary | ICD-10-CM | POA: Diagnosis not present

## 2024-11-28 DIAGNOSIS — R0602 Shortness of breath: Secondary | ICD-10-CM | POA: Insufficient documentation

## 2024-11-28 DIAGNOSIS — N3010 Interstitial cystitis (chronic) without hematuria: Secondary | ICD-10-CM | POA: Insufficient documentation

## 2024-11-28 DIAGNOSIS — K649 Unspecified hemorrhoids: Secondary | ICD-10-CM | POA: Insufficient documentation

## 2024-11-28 DIAGNOSIS — E785 Hyperlipidemia, unspecified: Secondary | ICD-10-CM | POA: Insufficient documentation

## 2024-11-28 DIAGNOSIS — M797 Fibromyalgia: Secondary | ICD-10-CM | POA: Insufficient documentation

## 2024-11-28 DIAGNOSIS — Z888 Allergy status to other drugs, medicaments and biological substances status: Secondary | ICD-10-CM | POA: Insufficient documentation

## 2024-11-28 DIAGNOSIS — Z881 Allergy status to other antibiotic agents status: Secondary | ICD-10-CM | POA: Insufficient documentation

## 2024-11-28 DIAGNOSIS — I2699 Other pulmonary embolism without acute cor pulmonale: Secondary | ICD-10-CM | POA: Insufficient documentation

## 2024-11-28 DIAGNOSIS — Z83438 Family history of other disorder of lipoprotein metabolism and other lipidemia: Secondary | ICD-10-CM | POA: Insufficient documentation

## 2024-11-28 DIAGNOSIS — M542 Cervicalgia: Secondary | ICD-10-CM | POA: Diagnosis not present

## 2024-11-28 DIAGNOSIS — K909 Intestinal malabsorption, unspecified: Secondary | ICD-10-CM | POA: Insufficient documentation

## 2024-11-28 DIAGNOSIS — J00 Acute nasopharyngitis [common cold]: Secondary | ICD-10-CM | POA: Insufficient documentation

## 2024-11-28 DIAGNOSIS — M549 Dorsalgia, unspecified: Secondary | ICD-10-CM | POA: Diagnosis not present

## 2024-11-28 DIAGNOSIS — Z90710 Acquired absence of both cervix and uterus: Secondary | ICD-10-CM | POA: Insufficient documentation

## 2024-11-28 DIAGNOSIS — Z800 Family history of malignant neoplasm of digestive organs: Secondary | ICD-10-CM | POA: Insufficient documentation

## 2024-11-28 DIAGNOSIS — Z79899 Other long term (current) drug therapy: Secondary | ICD-10-CM | POA: Insufficient documentation

## 2024-11-28 DIAGNOSIS — Z90722 Acquired absence of ovaries, bilateral: Secondary | ICD-10-CM | POA: Insufficient documentation

## 2024-11-28 DIAGNOSIS — E538 Deficiency of other specified B group vitamins: Secondary | ICD-10-CM | POA: Diagnosis not present

## 2024-11-28 DIAGNOSIS — Z9012 Acquired absence of left breast and nipple: Secondary | ICD-10-CM | POA: Insufficient documentation

## 2024-11-28 DIAGNOSIS — Z885 Allergy status to narcotic agent status: Secondary | ICD-10-CM | POA: Insufficient documentation

## 2024-11-28 DIAGNOSIS — Z86711 Personal history of pulmonary embolism: Secondary | ICD-10-CM | POA: Insufficient documentation

## 2024-11-28 DIAGNOSIS — Z8673 Personal history of transient ischemic attack (TIA), and cerebral infarction without residual deficits: Secondary | ICD-10-CM | POA: Insufficient documentation

## 2024-11-28 DIAGNOSIS — Z9049 Acquired absence of other specified parts of digestive tract: Secondary | ICD-10-CM | POA: Insufficient documentation

## 2024-11-28 DIAGNOSIS — K56609 Unspecified intestinal obstruction, unspecified as to partial versus complete obstruction: Secondary | ICD-10-CM | POA: Insufficient documentation

## 2024-11-28 DIAGNOSIS — R42 Dizziness and giddiness: Secondary | ICD-10-CM | POA: Diagnosis not present

## 2024-11-28 DIAGNOSIS — R040 Epistaxis: Secondary | ICD-10-CM | POA: Diagnosis not present

## 2024-11-28 DIAGNOSIS — R059 Cough, unspecified: Secondary | ICD-10-CM | POA: Diagnosis not present

## 2024-11-28 DIAGNOSIS — K90829 Short bowel syndrome, unspecified: Secondary | ICD-10-CM

## 2024-11-28 DIAGNOSIS — Z803 Family history of malignant neoplasm of breast: Secondary | ICD-10-CM | POA: Insufficient documentation

## 2024-11-28 DIAGNOSIS — Z832 Family history of diseases of the blood and blood-forming organs and certain disorders involving the immune mechanism: Secondary | ICD-10-CM | POA: Insufficient documentation

## 2024-11-28 DIAGNOSIS — Z8041 Family history of malignant neoplasm of ovary: Secondary | ICD-10-CM | POA: Insufficient documentation

## 2024-11-28 DIAGNOSIS — E039 Hypothyroidism, unspecified: Secondary | ICD-10-CM | POA: Insufficient documentation

## 2024-11-28 DIAGNOSIS — D751 Secondary polycythemia: Secondary | ICD-10-CM | POA: Diagnosis not present

## 2024-11-28 DIAGNOSIS — R5382 Chronic fatigue, unspecified: Secondary | ICD-10-CM | POA: Diagnosis not present

## 2024-11-28 DIAGNOSIS — Z8614 Personal history of Methicillin resistant Staphylococcus aureus infection: Secondary | ICD-10-CM | POA: Insufficient documentation

## 2024-11-28 DIAGNOSIS — M1990 Unspecified osteoarthritis, unspecified site: Secondary | ICD-10-CM | POA: Insufficient documentation

## 2024-11-28 DIAGNOSIS — K317 Polyp of stomach and duodenum: Secondary | ICD-10-CM | POA: Insufficient documentation

## 2024-11-28 DIAGNOSIS — Z823 Family history of stroke: Secondary | ICD-10-CM | POA: Insufficient documentation

## 2024-11-28 DIAGNOSIS — Z8719 Personal history of other diseases of the digestive system: Secondary | ICD-10-CM | POA: Insufficient documentation

## 2024-11-28 LAB — CBC WITH DIFFERENTIAL/PLATELET
Abs Immature Granulocytes: 0.06 K/uL (ref 0.00–0.07)
Basophils Absolute: 0.1 K/uL (ref 0.0–0.1)
Basophils Relative: 1 %
Eosinophils Absolute: 0.1 K/uL (ref 0.0–0.5)
Eosinophils Relative: 1 %
HCT: 45.6 % (ref 36.0–46.0)
Hemoglobin: 15.5 g/dL — ABNORMAL HIGH (ref 12.0–15.0)
Immature Granulocytes: 1 %
Lymphocytes Relative: 25 %
Lymphs Abs: 2.6 K/uL (ref 0.7–4.0)
MCH: 28.6 pg (ref 26.0–34.0)
MCHC: 34 g/dL (ref 30.0–36.0)
MCV: 84.1 fL (ref 80.0–100.0)
Monocytes Absolute: 0.7 K/uL (ref 0.1–1.0)
Monocytes Relative: 7 %
Neutro Abs: 7.1 K/uL (ref 1.7–7.7)
Neutrophils Relative %: 65 %
Platelets: 327 K/uL (ref 150–400)
RBC: 5.42 MIL/uL — ABNORMAL HIGH (ref 3.87–5.11)
RDW: 14.5 % (ref 11.5–15.5)
WBC: 10.7 K/uL — ABNORMAL HIGH (ref 4.0–10.5)
nRBC: 0 % (ref 0.0–0.2)

## 2024-11-28 LAB — IRON AND TIBC
Iron: 68 ug/dL (ref 28–170)
Saturation Ratios: 18 % (ref 10.4–31.8)
TIBC: 381 ug/dL (ref 250–450)
UIBC: 313 ug/dL

## 2024-11-28 LAB — FERRITIN: Ferritin: 236 ng/mL (ref 11–307)

## 2024-12-01 ENCOUNTER — Other Ambulatory Visit: Payer: Self-pay

## 2024-12-01 DIAGNOSIS — J069 Acute upper respiratory infection, unspecified: Secondary | ICD-10-CM

## 2024-12-01 MED ORDER — UNABLE TO FIND: 0 refills | Status: AC

## 2024-12-01 MED ORDER — IPRATROPIUM-ALBUTEROL 0.5-2.5 (3) MG/3ML IN SOLN: 3.0000 mL | Freq: Four times a day (QID) | RESPIRATORY_TRACT | 0 refills | Status: DC | PRN

## 2024-12-04 NOTE — Progress Notes (Unsigned)
 [facility] [address], [place] [address]   CLINIC:  Medical Oncology/Hematology  PCP:  Antonetta Rollene BRAVO, MD [address] [place] [address] [phone]   REASON FOR VISIT:  Follow-up for iron  deficiency anemia (secondary to malabsorption from multiple bowel resections + history of Paget's disease of left breast (mastectomy in 2006) + unprovoked PE in 1997 (chronic Xarelto )   CURRENT THERAPY: IV iron  (last INFED  on 06/09/2023)  INTERVAL HISTORY:   [name] 36b.o. female returns for routine follow-up of  iron  deficiency anemia. [name] was last evaluated via telemedicine visit by Pleasant Barefoot PA-C on 05/30/2024.  In the interim since [name] last visit, [name] was hospitalized twice. Hospitalized at [facility] from 07/06/2024 through 07/09/2024 for small bowel obstruction, which was managed conservatively. Surgery and hospitalization from 08/15/2024 through 08/22/2024 for treatment of thoracic outlet syndrome.  On 08/15/2024 [name] had transaxillary right first rib resection, percutaneous ultrasound-guided cannulation of right basilic vein, and right upper extremity venogram. Other notable events include flexible sigmoidoscopy at [facility] on 09/15/2024, which was limited due to poor prep; [name] did have patent end-to-side ileocolonic anastomosis and patent surgical anastomosis.  At today's visit, [name] reports feeling somewhat poorly due to acute head cold. [name] has fluctuating weight related to [name] gastroparesis and other GI issues. [name] reports 50% energy and 75% appetite. [name] has ongoing fatigue, diffuse myalgias, and additional symptoms recorded in ROS relating to multiple medical issues and high burden of chronic disease, particularly related to [name] gastroparesis and complications from prior abdominal surgeries.   HISTORY OF PULMONARY EMBOLISM: [name] continues to take Xarelto  for history of unprovoked pulmonary embolisms. No symptoms concerning for  new onset DV/PET. [name] has intermittent chronic chest pain, following with cardiology and electrophysiology.  IRON  DEFICIENCY ANEMIA: [name] reports occasional scant rectal bleeding from hemorrhoids.  No melena. [name] has intermittent epistaxis, especially in the winters.  [name] reports worsening nosebleeds since [name] had a head cold 2 to 3 weeks ago. [name] has ongoing chronic fatigue. Chronic ice pica  HISTORY OF PAGET'S DISEASE OF LEFT BREAST: [name] denies any new breast lumps or lymphadenopathy.  [name] reports onset of right nipple itching a few year ago, recently intensified.  [name] doe shave right breast implant that has reportedly been recalled, planning to talk to plastic surgeon at [facility].  Diagnostic mammogram of right breast (10/10/2024) was negative for any evidence of malignancy.  (Per radiologist recommendations, recommend dermatology consultation if symptoms persist or worsen, although patient also qualifies for high risk screening protocol with bilateral breast MRI with and without contrast on an annual basis, alternating with mammograms.  This is due to [name] Paget's disease at relatively young age.)  ASSESSMENT & PLAN:  1.   Iron  deficiency anemia: - History of iron  deficiency status secondary to multiple bowel resections and malabsorption. - Previous work-up (02/13/2021) showed normal LDH.  Folic acid  and B12, copper  and methylmalonic acid were normal.  SPEP was negative.  CA 15-3 and TSH were also normal. - Most recent EGD (03/31/2022) showed Barrett's esophagus with normal stomach.  Single duodenal polyp resected. - Most recent colonoscopy (05/02/2021) was limited by liquid stool in the colon, showed nonbleeding internal hemorrhoids - Patient was given IV Venofer  on 02/18/2021 but had a reaction to Venofer .  Most recently received received INFeD  infusion on 06/09/2023, which [name] tolerated after premedication was given. - No gross hematochezia or melena - Most recent labs (11/28/2024): Hgb 15.5/MCV 84.1,  ferritin 236,  iron  saturation 18% - PLAN: No indication for IV iron  at this time. - Mild erythrocytosis noted, has been present intermittently before, likely related to dehydration in the setting of significant GI issues - Labs and office visit in 6 months.   2.  Unprovoked pulmonary embolism: - Originally diagnosed in 1997 and was treated with Coumadin. - Reportedly had IVC filter placed in 2006 when [name] had bleeding at the time of breast surgery and Coumadin discontinued. - Had IVC filter removed at [facility] around September 2021.  [name] was started on Xarelto  at that time. - No current signs or symptoms of DVT/PE. - Patient is worried that [name] is not digesting [name] medications, since [name] is having issues with GI motility, gastroparesis, and frequent vomiting. - We discussed that [name] could change from Xarelto  to Lovenox , but [name] prefers to hold off on this for the time being.  [name] will discuss with [name] other specialists and reach out to our clinic if [name] decides to change the formulation of [name] anticoagulant. - PLAN: Continue Xarelto .  Would consider switching to Lovenox  in the future if there is persistent concern for GI absorption of medications in the setting of GI motility issues and gastroparesis.    3.  History of Paget's disease of left breast - History of Paget's disease of the left breast which was resected with LEFT MASTECTOMY in 2006. - Reports right nipple itching approximately , but diagnostic mammogram of right breast (10/10/2024) was negative for any evidence of malignancy.  (Per radiologist recommendations, recommend dermatology consultation if symptoms persist or worsen, although patient also qualifies for high risk screening protocol with bilateral breast MRI with and without contrast on an annual basis, alternating with mammograms.  This is due to [name] Paget's disease at relatively young age.) - Patient reports that [name] was also told that [name] right breast implant has been  recalled and [name] hopes to get this removed in the future. - [name] had negative BRCA testing in 2006.  [name] has strong family history of breast cancer and reports multiple maternal aunts and first-degree cousins who have died from breast cancer. - PLAN: Patient is receiving surveillance via [name] PCP.  Continue annual mammograms and breast exams via PCP and breast center. -  Discussed recommendations for high risk screening protocol with annual bilateral breast MRI in addition to annual mammogram, which can also be ordered via PCP.  Since last mammogram was in October 2025, breast MRI should be done in April 2026.   - [name] was referred for genetic counseling per [name] request.  [name] has been no-show to genetic counseling appointment x 2. [name] is requesting new referral at this time.   4.  B12 deficiency secondary to intestinal malabsorption - [name] has been self-administering monthly B12 injections at home - Most recent B12 and MMA are normal (05/23/2024) - PLAN: Continue monthly B12 injections.  We will check levels annually (May/June 2026).   5.  Vitamin D  deficiency - Prior vitamin D  level (04/29/2022) showed vitamin D  overload at 131.72 in the setting of vitamin D  50,000 units weekly - [name] is currently taking vitamin D  50,000 units monthly - Most recent vitamin D  (05/23/2024) is normal at 50.60 - PLAN: Continue vitamin D  50,000 units monthly. - We will recheck annually (May/June 2026)  6.  Hypomagnesemia - Secondary to gastroparesis, malabsorption, and other GI issues. - PLAN: Continue management by primary care.   PLAN SUMMARY:  >> New referral has been entered to genetic counselor >>  Labs in 6 months = CBC/D, ferritin, iron /TIBC, B12, MMA, vitamin D  >> PHONE visit in 6 months (1 week after labs)     REVIEW OF SYSTEMS:   Review of Systems  Constitutional:  Positive for fatigue. Negative for appetite change, chills, diaphoresis, fever and unexpected weight change.  HENT:   Negative for lump/mass  and nosebleeds.   Eyes:  Negative for eye problems.  Respiratory:  Positive for cough and shortness of breath. Negative for hemoptysis.   Cardiovascular:  Positive for chest pain. Negative for leg swelling and palpitations.  Gastrointestinal:  Positive for diarrhea. Negative for abdominal pain, blood in stool, constipation, nausea and vomiting.  Genitourinary:  Negative for hematuria.   Musculoskeletal:  Positive for back pain and neck pain.  Skin: Negative.   Neurological:  Positive for dizziness. Negative for headaches and light-headedness.  Hematological:  Does not bruise/bleed easily.     PHYSICAL EXAM:  ECOG PERFORMANCE STATUS: 1 - Symptomatic but completely ambulatory  Vitals:   12/05/24 1458  BP: 128/86  Pulse: 94  Resp: 20  Temp: 99.3 F (37.4 C)  SpO2: 99%   Filed Weights   12/05/24 1458  Weight: 196 lb 6.9 oz (89.1 kg)   Physical Exam Constitutional:      Appearance: Normal appearance. [name] is obese.  Cardiovascular:     Heart sounds: Normal heart sounds.  Pulmonary:     Breath sounds: Normal breath sounds.  Neurological:     General: No focal deficit present.     Mental Status: Mental status is at baseline.  Psychiatric:        Behavior: Behavior normal. Behavior is cooperative.     PAST MEDICAL/SURGICAL HISTORY:  Past Medical History:  Diagnosis Date   Abdominal hernia 08/14/2019   Abdominal pain 06/06/2019   Acute bronchitis 05/01/2013   Acute cystitis 06/09/2010   Qualifier: Diagnosis of  By: Charlsie LPN, Brandi     Acute renal insufficiency 08/28/2011   Allergy    Phreesia 03/16/2021   Anemia    Phreesia 10/29/2020   Anxiety    Anxiety state 03/16/2008   Qualifier: Diagnosis of  By: Tita Lipps     Arthritis    Phreesia 10/29/2020   Back pain with radiation 07/17/2014   Bilateral hand pain 07/27/2016   Blood transfusion without reported diagnosis    Phreesia 10/29/2020   Breast cancer (HCC)    L breast- 2006   Cancer (HCC)    Phreesia  10/29/2020   Carpal tunnel syndrome    Chronic constipation    Chronic pain syndrome    followed by [facility]---  back   Clotting disorder    Phreesia 10/29/2020   Depression    Dermatitis 12/15/2011   Difficult intubation    Patient thinks something was mentioned in 2022 at [facility] with Botox  Injections.   Educated about COVID-19 virus infection 05/14/2019   Fall at home 01/26/2016   Family history of adverse reaction to anesthesia    MOTHER--- PONV   Fatigue 09/17/2012   Fibromyalgia    FIBROMYALGIA 03/16/2008   Qualifier: Diagnosis of  By: Tita Lipps     GERD (gastroesophageal reflux disease)    Headache disorder 01/16/2013   Headache syndrome 01/26/2016   Heart murmur 1996   Hip pain, right 07/08/2019   History of blood transfusion 08/2016   in [place]   History of MRSA infection    lip abscess   History of ovarian cyst 06/2011   s/p  BSO   History of pulmonary embolus (PE) 1997   post EP with ablation pulmonary veouns for SVT/ Atrial Fib.; also on 09/22/22 in [facility]   History of supraventricular tachycardia    s/p  ablation 1996  and 1997  by dr fernande   History of TIA (transient ischemic attack) 1997   post op EP ablation PE   Hyperlipidemia    Hyperlipidemia LDL goal <100 03/16/2008   Qualifier: Diagnosis of  By: Tita Lipps     Hypothyroidism    followed by pcp   Incisional hernia    Insomnia 12/15/2011   Intermittent palpitations 08/22/2017   Interstitial cystitis    09-13-2018   per pt last flare-up  May 2019 (followed by pcp)   INTERSTITIAL CYSTITIS 02/17/2011   Qualifier: Diagnosis of  By: Antonetta MD, Margaret     Iron  deficiency anemia    09-13-2018  PER PT STABLE   Irregular heart rate 07/25/2013   Lipoma of back    upper   Low back pain radiating to right leg 07/04/2019   Low ferritin level 06/14/2014   MDD (major depressive disorder), single episode, in full remission 10/27/2017   Medically noncompliant 02/28/2015   Multiple missed  appointments, both follow-up appointments and lab appointments.    Metabolic syndrome X 02/10/2011   Qualifier: Diagnosis of  By: Antonetta MD, Margaret  hBA1c is 5.8 in 02/2013    Migraines    Morbid obesity (HCC) 03/27/2013   Muscle spasm 01/03/2020   Nausea alone 07/17/2014   NECK PAIN, CHRONIC 03/16/2008   Qualifier: Diagnosis of  By: Tita Lipps     Normal coronary arteries    a. by CT 12/2018.   Obesity 03/16/2008   Qualifier: Diagnosis of  By: Tita Lipps     Oral ulceration 08/23/2014   Presented at 06/14/2014 visit    Other malaise and fatigue 03/16/2008   Centricity Description: FATIGUE, CHRONIC Qualifier: Diagnosis of  By: Tita Lipps   Centricity Description: FATIGUE Qualifier: Diagnosis of  By: Windell PA, Dawn     OVARIAN CYST 12/26/2009   Qualifier: Diagnosis of  By: Antonetta MD, Margaret     Paget's disease of breast, left (HCC) 03/16/2008   Qualifier: Diagnosis of  By: Tita Lipps  Left diagnosed in 2006 F/h breast cancer x 15 family members   Palpitations    hx- appears to coincide with dehydration and electrolyte disturbances - see 08/01/24 note   Paroxysmal A-fib (HCC)    hx - appears to coincide with dehydration and electrolyte disturbances - see 08/01/24 note   Partial small bowel obstruction (HCC) 07/04/2012   Pneumonia    x 2   PONV (postoperative nausea and vomiting)    SEVERE   Postsurgical menopause 11/13/2011   Presence of IVC filter 06/06/2019   PSVT (paroxysmal supraventricular tachycardia)    HX ABLATION 1996 AND 1997   Recurrent oral herpes simplex infection 10/13/2017   ROM (right otitis media) 01/23/2013   S/P insertion of IVC (inferior vena caval) filter 05/08/2005   [place] (non-retrievable)  /  dx 2019  a leg of filter is protruding thru the vena cava in to right L2 vertebral body (09-13-2018  per pt having surgery to remove filter in [place])   S/P radiofrequency ablation operation for arrhythmia 1996   1996  and 1997,   SVT and  Atrial Fib   Sinusitis, chronic 01/26/2016   SMALL BOWEL OBSTRUCTION, HX OF 08/07/2008   Annotation: obstruction w/ adhesions led to partial  colectomy Qualifier: Diagnosis of  By: Georgina Palma     Stroke [facility])    Phreesia 10/29/2020, TIAs in the past -1989   Swelling of hand 09/17/2012   Syncope    Tachycardia 02/03/2010   Qualifier: Diagnosis of  By: Via LPN, Lynn     Thyroid  disease    Phreesia 10/29/2020   TOS (thoracic outlet syndrome)    right side   Ulcer 12/15/2011   Urinary frequency 07/18/2014   Vaginitis and vulvovaginitis 05/10/2013   Vitamin D  deficiency 05/05/2015   Wears glasses    Past Surgical History:  Procedure Laterality Date   ABDOMINAL HYSTERECTOMY  1987   ANTERIOR CERVICAL DECOMP/DISCECTOMY FUSION  03-07-2002   dr elsner  @[facility]    C 4 -- 5   APPENDECTOMY  1980   AUGMENTATION MAMMAPLASTY Right 2006   BILATERAL SALPINGOOPHORECTOMY  07/24/2011   via Explor. Lap. w/ intraoperative perf. bowel repair   BIOPSY  05/02/2021   Procedure: BIOPSY;  Surgeon: Eartha Flavors, Toribio, MD;  [facility];  Service: Gastroenterology;;  small bowel, mid esophagus, distal esophagus, random colon biopsies   BIOPSY  12/04/2021   Procedure: BIOPSY;  Surgeon: Cindie Carlin POUR, DO;  [facility];  Service: Endoscopy;;   BIOPSY  03/31/2022   Procedure: BIOPSY;  Surgeon: Eartha Flavors Toribio, MD;  [facility];  Service: Gastroenterology;;   BOTOX  INJECTION N/A 03/31/2022   Procedure: BOTOX  INJECTION;  Surgeon: Eartha Flavors Toribio, MD;  [facility];  Service: Gastroenterology;  Laterality: N/A;   BREAST BIOPSY Right 2019   benign   BREAST ENHANCEMENT SURGERY Bilateral 1993   BREAST IMPLANT REMOVAL Bilateral    BREAST SURGERY N/A    Phreesia 10/29/2020   CARDIAC CATHETERIZATION  07-06-2003   dr wolm brodie   normal coronaries and LVF   CARDIAC ELECTROPHYSIOLOGY STUDY AND ABLATION  1996  and 1997   CARDIOVASCULAR  STRESS TEST  11-18-2015   dr fernande   normal nuclear study w/ no ischemia/  normal LV function and wall motion , ef 84%   CARPAL TUNNEL RELEASE Right ?   CESAREAN SECTION  1986   CESAREAN SECTION N/A    Phreesia 10/29/2020   CHOLECYSTECTOMY N/A    Phreesia 10/29/2020   COLON SURGERY     COLONOSCOPY WITH PROPOFOL  N/A 05/02/2021   Procedure: COLONOSCOPY WITH PROPOFOL ;  Surgeon: Eartha Flavors Toribio, MD;  [facility];  Service: Gastroenterology;  Laterality: N/A;  12:30 PM   COSMETIC SURGERY  2006   After breast cancer   CYSTO/  HYDRODISTENTION/  INSTILATION THERAPY  MULTIPLE   ENTEROCUTANEOUS FISTULA CLOSURE  multiple   last one 2015 with small bowel resection   ESOPHAGOGASTRODUODENOSCOPY (EGD) WITH PROPOFOL  N/A 05/02/2021   Procedure: ESOPHAGOGASTRODUODENOSCOPY (EGD) WITH PROPOFOL ;  Surgeon: Eartha Flavors Toribio, MD;  [facility];  Service: Gastroenterology;  Laterality: N/A;   ESOPHAGOGASTRODUODENOSCOPY (EGD) WITH PROPOFOL  N/A 12/04/2021   Procedure: ESOPHAGOGASTRODUODENOSCOPY (EGD) WITH PROPOFOL ;  Surgeon: Cindie Carlin POUR, DO;  [facility];  Service: Endoscopy;  Laterality: N/A;   ESOPHAGOGASTRODUODENOSCOPY (EGD) WITH PROPOFOL  N/A 03/31/2022   Procedure: ESOPHAGOGASTRODUODENOSCOPY (EGD) WITH PROPOFOL ;  Surgeon: Eartha Flavors Toribio, MD;  [facility];  Service: Gastroenterology;  Laterality: N/A;  1015   EXPLORATORY LAPAROTOMY INCISIONAL VENTRAL HERNIA REPAIR / RESECTION SMALL BOWEL  11-09-2014   @[facility]    FRACTURE SURGERY N/A    Phreesia 10/29/2020   HERNIA REPAIR  2012-2015   Failed  JOINT REPLACEMENT N/A    Phreesia 10/29/2020   LIPOMA EXCISION Right 09/16/2018   Procedure: EXCISION LIPOMA UPPER BACK;  Surgeon: Signe Mitzie LABOR, MD;  Location: [facility];  Service: General;  Laterality: Right;   MASTECTOMY Left 2006   w/ reconstruction on left (paget's disease)  and right breast augmentation    PERIPHERAL VASCULAR BALLOON ANGIOPLASTY Right 09/13/2023   Procedure: PERIPHERAL VASCULAR BALLOON ANGIOPLASTY;  Surgeon: Sheree Penne Bruckner, MD;  Location: [facility]VASIVE CV LAB;  Service: Cardiovascular;  Laterality: Right;  Subclavian and axillary   POLYPECTOMY  05/02/2021   Procedure: POLYPECTOMY;  Surgeon: Eartha Angelia Sieving, MD;  [facility];  Service: Gastroenterology;;  gastric   POLYPECTOMY  03/31/2022   Procedure: POLYPECTOMY;  Surgeon: Eartha Angelia Sieving, MD;  [facility];  Service: Gastroenterology;;   RIB RESECTION Right 08/15/2024   Procedure: EXCISION, RIB;  Surgeon: Sheree Penne Bruckner, MD;  Location: [facility] OR;  Service: Vascular;  Laterality: Right;   SAVORY DILATION  05/02/2021   Procedure: HARLEY DILATION;  Surgeon: Eartha Angelia Sieving, MD;  [facility];  Service: Gastroenterology;;   SMALL INTESTINE SURGERY N/A    Phreesia 10/29/2020   SPINE SURGERY N/A    Phreesia 10/29/2020   TOTAL COLECTOMY  08-04-2002    @[facility]    AND CHOLECYSTECTOMY  (colonic inertia)   TRANSTHORACIC ECHOCARDIOGRAM  02/04/2016   ef 60-65%,  grade 2 diastolic dysfunction/  mild MR   TUBAL LIGATION N/A    Phreesia 03/16/2021   UPPER EXTREMITY VENOGRAPHY N/A 09/13/2023   Procedure: UPPER EXTREMITY VENOGRAPHY;  Surgeon: Sheree Penne Bruckner, MD;  Location: [facility] INVASIVE CV LAB;  Service: Cardiovascular;  Laterality: N/A;   VENA CAVA FILTER PLACEMENT  05/08/2005   @[facility]    [place] (non-retrievable)   VENOGRAM N/A 08/15/2024   Procedure: VENOGRAM;  Surgeon: Sheree Penne Bruckner, MD;  Location: [facility] OR;  Service: Vascular;  Laterality: N/A;  Possible Venogram   WIDE EXCISION PERIRECTAL ABSCESSES  09-22-2005   @ [facility]    SOCIAL HISTORY:  Social History   Socioeconomic History   Marital status: Married    Spouse name: Not on file   Number of children: Not on file   Years of education: Not on file   Highest education level: 12th grade   Occupational History   Not on file  Tobacco Use   Smoking status: Never   Smokeless tobacco: Never  Vaping Use   Vaping status: Never Used  Substance and Sexual Activity   Alcohol use: No   Drug use: No   Sexual activity: Not Currently    Birth control/protection: Surgical    Comment: hysterectomy  Other Topics Concern   Not on file  Social History Narrative   Right handed   Social Drivers of Health   Financial Resource Strain: Low Risk  (11/06/2024)   Overall Financial Resource Strain (CARDIA)    Difficulty of Paying Living Expenses: Not hard at all  Food Insecurity: No Food Insecurity (11/06/2024)   Hunger Vital Sign    Worried About Running Out of Food in the Last Year: Never true    Ran Out of Food in the Last Year: Never true  Transportation Needs: No Transportation Needs (11/06/2024)   PRAPARE - Administrator, Civil Service (Medical): No    Lack of Transportation (Non-Medical): No  Physical Activity: Unknown (11/06/2024)   Exercise Vital Sign    Days of Exercise per Week: Patient declined  Minutes of Exercise per Session: Not on file  Stress: No Stress Concern Present (11/06/2024)   [employer] of Occupational Health - Occupational Stress Questionnaire    Feeling of Stress: Only a little  Social Connections: Unknown (11/06/2024)   Social Connection and Isolation Panel    Frequency of Communication with Friends and Family: More than three times a week    Frequency of Social Gatherings with Friends and Family: More than three times a week    Attends Religious Services: 1 to 4 times per year    Active Member of [employer] or Organizations: Patient declined    Attends Banker Meetings: Not on file    Marital Status: Married  Intimate Partner Violence: Not At Risk (08/15/2024)   Humiliation, Afraid, Rape, and Kick questionnaire    Fear of Current or Ex-Partner: No    Emotionally Abused: No    Physically Abused: No    Sexually Abused: No     FAMILY HISTORY:  Family History  Problem Relation Age of Onset   Stroke Mother    Migraines Mother    Congestive Heart Failure Mother    Diabetes Mother    Heart disease Mother    Hypertension Mother    Other Mother        ketoacidosis   Arthritis Mother    Asthma Mother    COPD Mother    Kidney disease Mother    Miscarriages / Stillbirths Mother    Vision loss Mother    Heart disease Father    Hyperlipidemia Father    Hypertension Father    Alcohol abuse Father    Congestive Heart Failure Father    Colon cancer Maternal Aunt    Breast cancer Maternal Aunt    Breast cancer Maternal Aunt    Congestive Heart Failure Maternal Aunt    Cancer Maternal Aunt    Heart attack Maternal Aunt    Breast cancer Maternal Aunt    Cancer Maternal Aunt    Rheum arthritis Maternal Aunt    Breast cancer Maternal Aunt    Breast cancer Maternal Aunt    Cancer Maternal Uncle        mets   Prostate cancer Maternal Uncle    Migraines Maternal Grandmother    Anemia Maternal Grandmother    Bone cancer Maternal Grandfather        mets   Clotting disorder Maternal Grandfather    Migraines Daughter    Supraventricular tachycardia Daughter    Ovarian cancer Cousin 19   Breast cancer Cousin    Asthma Paternal Grandmother     CURRENT MEDICATIONS:  Outpatient Encounter Medications as of 12/05/2024  Medication Sig Note   acyclovir  ointment (ZOVIRAX ) 5 % Apply to affected area(s) every 4 hours while awake for 5  days , then as needed (Patient taking differently: Apply 1 Application topically every 4 (four) hours as needed (irritation).)    albuterol  (PROVENTIL ) (2.5 MG/3ML) 0.083% nebulizer solution Take 2.5 mg by nebulization every 6 (six) hours as needed.    ALPRAZolam  (XANAX ) 1 MG tablet Take 1 tablet (1 mg total) by mouth 2 (two) times daily.    aspirin  81 MG chewable tablet Chew 81 mg by mouth in the morning.    botulinum toxin Type A  (BOTOX ) 200 units injection Inject 155 units IM into  multiple site in the face,neck and head once every 90 days 04/13/2024: Has not started this yet   conjugated estrogens  (PREMARIN ) vaginal cream INSERT pea sized amount  three times weekly intravaginally (Patient taking differently: Place 1 applicator vaginally at bedtime.)    cyanocobalamin  (VITAMIN B12) 1000 MCG/ML injection ADMINISTER 1 ML(1000 MCG) IN THE MUSCLE EVERY 30 DAYS    cyclobenzaprine  (FLEXERIL ) 10 MG tablet Take 1 tablet (10 mg total) by mouth 2 (two) times daily as needed for muscle spasms. (Patient taking differently: Take 10 mg by mouth in the morning and at bedtime.)    cycloSPORINE  (RESTASIS ) 0.05 % ophthalmic emulsion Place 1 drop into both eyes 2 (two) times daily as needed (dry eyes).    DULoxetine  (CYMBALTA ) 60 MG capsule TAKE 1 CAPSULE(60 MG) BY MOUTH DAILY    ezetimibe  (ZETIA ) 10 MG tablet TAKE 1 TABLET(10 MG) BY MOUTH DAILY    fenofibrate  (TRICOR ) 48 MG tablet TAKE 1 TABLET(48 MG) BY MOUTH DAILY (Patient taking differently: Take 48 mg by mouth at bedtime.)    HYDROmorphone  (DILAUDID ) 4 MG tablet Take 4 mg by mouth 3 (three) times daily.    INTRAROSA 6.5 MG INST Place 1 suppository vaginally 2 (two) times a week. Mondays & Fridays    ipratropium-albuterol  (DUONEB) 0.5-2.5 (3) MG/3ML SOLN Take 3 mLs by nebulization every 6 (six) hours as needed.    levothyroxine  (SYNTHROID ) 125 MCG tablet TAKE 1 TABLET(125 MCG) BY MOUTH DAILY BEFORE BREAKFAST    lidocaine  (XYLOCAINE ) 2 % solution Use as directed 15 mLs in the mouth or throat as needed for mouth pain.    linaclotide  (LINZESS ) 290 MCG CAPS capsule Take 1 capsule (290 mcg total) by mouth daily before breakfast.    magnesium  oxide (MAG-OX) 400 (240 Mg) MG tablet TAKE 1 TABLET BY MOUTH FOUR TIMES DAILY    MegaRed Omega-3 Krill Oil 500 MG CAPS Take 500 mg by mouth at bedtime.    metoCLOPramide  (REGLAN ) 10 MG tablet Take 1 tablet (10 mg total) by mouth every 6 (six) hours. (Patient taking differently: Take 10 mg by mouth every 6 (six)  hours as needed (gastroparesis symptoms).)    metoprolol  succinate (TOPROL -XL) 25 MG 24 hr tablet Take 25 mg by mouth daily as needed (elevated blood pressure). Patient states takes this meds for [name] heart.    Multiple Vitamins-Minerals (ICAPS AREDS 2 PO) Take 1 capsule by mouth at bedtime.    naloxone (NARCAN) nasal spray 4 mg/0.1 mL Place 1 spray into the nose as needed (opioid reversal). 04/13/2024: Has not used   omeprazole  (PRILOSEC) 40 MG capsule TAKE 1 CAPSULE(40 MG) BY MOUTH IN THE MORNING AND AT BEDTIME    OVER THE COUNTER MEDICATION Take 3 capsules by mouth in the morning. Nutrafol Hair Wellness supplement    Polyethylene Glycol 400 (BLINK TEARS OP) Take 1 capsule by mouth at bedtime. Blink NutriTears Supplement for Dry Eyes    potassium chloride  (KLOR-CON  M) 10 MEQ tablet TAKE 1 TABLET(10 MEQ) BY MOUTH TWICE DAILY    pravastatin  (PRAVACHOL ) 20 MG tablet TAKE 1 TABLET(20 MG) BY MOUTH DAILY (Patient taking differently: Take 20 mg by mouth at bedtime.)    predniSONE  (DELTASONE ) 20 MG tablet Take 20 mg by mouth 2 (two) times daily.    promethazine -dextromethorphan  (PROMETHAZINE -DM) 6.25-15 MG/5ML syrup Take 5 mLs by mouth 3 (three) times daily as needed for cough.    PROMETHEGAN 25 MG suppository Place 25 mg rectally every 6 (six) hours as needed for nausea or vomiting. 04/13/2024: Has not used yet   Rimegepant Sulfate (NURTEC) 75 MG TBDP Medication Samples have been provided to the patient.  Drug name: Nurtec     Strength:  75mg         Qty: 2 boxes  LOT: [id number]  Exp.Date: 05/2025  Dosing instructions:   The patient has been instructed regarding the correct time, dose, and frequency of taking this medication, including desired effects and most common side effects.   Mahina A Allen 3:04 PM 06/23/2024    UNABLE TO FIND Med Name: Nebulizer    Vitamin D , Ergocalciferol , (DRISDOL ) 1.25 MG (50000 UNIT) CAPS capsule Take 50,000 Units by mouth every 30 (thirty) days.    XARELTO  20 MG TABS  tablet TAKE 1 TABLET(20 MG) BY MOUTH DAILY    [DISCONTINUED] ALPRAZolam  (XANAX ) 1 MG tablet Take 1 tablet (1 mg total) by mouth 2 (two) times daily.    [DISCONTINUED] ALPRAZolam  (XANAX ) 1 MG tablet Take 1 tablet (1 mg total) by mouth 2 (two) times daily.    Facility-Administered Encounter Medications as of 12/05/2024  Medication   magnesium  sulfate IVPB 2 g 50 mL    ALLERGIES:  Allergies  Allergen Reactions   Nitrofurantoin Hives   Crestor  [Rosuvastatin  Calcium ] Other (See Comments)    Generalized cramps   Bisacodyl  Other (See Comments)    Makes patient feel like [name] is having cramps    Codeine Itching   Iron  Sucrose Other (See Comments)    Flushing- required benadryl  and solu medrol    Monosodium Glutamate Other (See Comments)    Cluster migraines    Scopolamine Hbr Other (See Comments)    Cluster migraines, impaired vision   Morphine  Rash and Hives   Ondansetron  Hcl Palpitations, Rash and Swelling   Other Hives, Itching, Swelling, Rash and Other (See Comments)    Cigarette smoke Causes Migraines     LABORATORY DATA:  I have reviewed the labs as listed.  CBC    Component Value Date/Time   WBC 10.7 (H) 11/28/2024 1505   RBC 5.42 (H) 11/28/2024 1505   HGB 15.5 (H) 11/28/2024 1505   HGB 14.3 05/05/2024 1401   HCT 45.6 11/28/2024 1505   HCT 42.8 05/05/2024 1401   PLT 327 11/28/2024 1505   PLT 211 05/05/2024 1401   MCV 84.1 11/28/2024 1505   MCV 92 05/05/2024 1401   MCH 28.6 11/28/2024 1505   MCHC 34.0 11/28/2024 1505   RDW 14.5 11/28/2024 1505   RDW 13.5 05/05/2024 1401   LYMPHSABS 2.6 11/28/2024 1505   LYMPHSABS 2.1 05/05/2024 1401   MONOABS 0.7 11/28/2024 1505   EOSABS 0.1 11/28/2024 1505   EOSABS 0.1 05/05/2024 1401   BASOSABS 0.1 11/28/2024 1505   BASOSABS 0.1 05/05/2024 1401      Latest Ref Rng & Units 08/20/2024    4:04 AM 08/16/2024    8:56 AM 08/15/2024    4:39 PM  CMP  Glucose 70 - 99 mg/dL 892  889  819   BUN 8 - 23 mg/dL 20  21  18    Creatinine  0.44 - 1.00 mg/dL 9.00  9.02  8.77   Sodium 135 - 145 mmol/L 135  140  140   Potassium 3.5 - 5.1 mmol/L 3.5  4.3  4.7   Chloride 98 - 111 mmol/L 102  103  108   CO2 22 - 32 mmol/L 23  22  20    Calcium  8.9 - 10.3 mg/dL 8.4  9.0  8.7   Total Protein 6.5 - 8.1 g/dL   5.7   Total Bilirubin 0.0 - 1.2 mg/dL   0.2   Alkaline Phos 38 - 126 U/L   56   AST 15 -  41 U/L   47   ALT 0 - 44 U/L   21     DIAGNOSTIC IMAGING:  I have independently reviewed the relevant imaging and discussed with the patient.   WRAP UP:  All questions were answered. The patient knows to call the clinic with any problems, questions or concerns.  Medical decision making: Moderate  Time spent on visit: I spent 25 minutes counseling the patient face to face. The total time spent in the appointment was 40 minutes and more than 50% was on counseling.  Pleasant CHRISTELLA Barefoot, PA-C  12/05/24 10:30 PM

## 2024-12-05 ENCOUNTER — Inpatient Hospital Stay: Admitting: Physician Assistant

## 2024-12-05 VITALS — BP 128/86 | HR 94 | Temp 99.3°F | Resp 20 | Wt 196.4 lb

## 2024-12-05 DIAGNOSIS — E538 Deficiency of other specified B group vitamins: Secondary | ICD-10-CM

## 2024-12-05 DIAGNOSIS — D509 Iron deficiency anemia, unspecified: Secondary | ICD-10-CM | POA: Diagnosis not present

## 2024-12-05 DIAGNOSIS — K90829 Short bowel syndrome, unspecified: Secondary | ICD-10-CM

## 2024-12-05 DIAGNOSIS — E559 Vitamin D deficiency, unspecified: Secondary | ICD-10-CM

## 2024-12-05 DIAGNOSIS — Z853 Personal history of malignant neoplasm of breast: Secondary | ICD-10-CM

## 2024-12-05 DIAGNOSIS — D500 Iron deficiency anemia secondary to blood loss (chronic): Secondary | ICD-10-CM

## 2024-12-05 NOTE — Patient Instructions (Addendum)
 [facility] at [facility] **VISIT SUMMARY & IMPORTANT INSTRUCTIONS **   You were seen today by Pleasant Barefoot PA-C for your follow up visit.    NUTRITIONAL DEFICIENCIES (related to intestinal malabsorption) IRON  DEFICIENCY: Your blood and iron  levels look great!  You do not need any IV iron  at this time. VITAMIN B12 DEFICIENCY: Continue vitamin B12 injections once a month.  Will recheck levels at your next visit. VITAMIN D  DEFICIENCY: Continue vitamin D  50,000 units once a month.  Will recheck levels at your next visit.  HISTORY OF PULMONARY EMBOLISM Continue Xarelto  20 mg daily  HISTORY OF BREAST CANCER (Paget's disease of the left breast) Continue screening with breast exam and mammograms for your primary care provider. Per radiologist report on most recent mammogram, you would also qualify for high risk screening protocol, which includes annual bilateral breast MRI in addition to annual mammogram.  These are usually staggered so that you are receiving one or the other every 6 months. We will enter referral to genetic counselor due to your strong family history. Make an appointment with plastic surgeon of your choice due to recalled breast implant.  FOLLOW-UP APPOINTMENT: 6 months  ** Thank you for trusting me with your healthcare!  I strive to provide all of my patients with quality care at each visit.  If you receive a survey for this visit, I would be so grateful to you for taking the time to provide feedback.  Thank you in advance!  ~ Remy Dia                                        Dr. Mickiel Davonna Pleasant Barefoot, PA-C          Delon Hope, NP   - - - - - - - - - - - - - - - - - -    Thank you for choosing [facility] at [facility] to provide your oncology and hematology care.  To afford each patient quality time with our provider, please arrive at least 15 minutes before your scheduled appointment time.   If you  have a lab appointment with the Cancer Center please come in thru the Main Entrance and check in at the main information desk.  You need to re-schedule your appointment should you arrive 10 or more minutes late.  We strive to give you quality time with our providers, and arriving late affects you and other patients whose appointments are after yours.  Also, if you no show three or more times for appointments you may be dismissed from the clinic at the providers discretion.     Again, thank you for choosing [facility].  Our hope is that these requests will decrease the amount of time that you wait before being seen by our physicians.       _____________________________________________________________  Should you have questions after your visit to [facility], please contact our office at [phone] and follow the prompts.  Our office hours are 8:00 a.m. and 4:30 p.m. Monday - Friday.  Please note that voicemails left after 4:00 p.m. may not be returned until the following business day.  We are closed weekends and major holidays.  You do have access to a nurse 24-7, just call the main number to the clinic [phone]  and do not press any options, hold on the line and a nurse will answer the phone.    For prescription refill requests, have your pharmacy contact our office and allow 72 hours.

## 2024-12-06 ENCOUNTER — Other Ambulatory Visit: Payer: Self-pay | Admitting: Family Medicine

## 2024-12-06 ENCOUNTER — Encounter: Payer: Self-pay | Admitting: Family Medicine

## 2024-12-07 ENCOUNTER — Inpatient Hospital Stay: Payer: Self-pay

## 2024-12-12 ENCOUNTER — Other Ambulatory Visit: Payer: Self-pay | Admitting: Family Medicine

## 2024-12-12 DIAGNOSIS — J069 Acute upper respiratory infection, unspecified: Secondary | ICD-10-CM

## 2024-12-15 ENCOUNTER — Other Ambulatory Visit: Payer: Self-pay | Admitting: Family Medicine

## 2024-12-15 DIAGNOSIS — J069 Acute upper respiratory infection, unspecified: Secondary | ICD-10-CM

## 2024-12-22 ENCOUNTER — Ambulatory Visit: Admitting: Neurology

## 2024-12-25 ENCOUNTER — Encounter: Payer: Self-pay | Admitting: *Deleted

## 2024-12-29 ENCOUNTER — Telehealth: Admitting: Family Medicine

## 2024-12-29 DIAGNOSIS — J40 Bronchitis, not specified as acute or chronic: Secondary | ICD-10-CM

## 2024-12-29 DIAGNOSIS — J9811 Atelectasis: Secondary | ICD-10-CM | POA: Diagnosis not present

## 2024-12-29 DIAGNOSIS — J069 Acute upper respiratory infection, unspecified: Secondary | ICD-10-CM

## 2024-12-29 MED ORDER — AZITHROMYCIN 250 MG PO TABS: ORAL_TABLET | ORAL | 0 refills | Status: AC

## 2024-12-29 MED ORDER — GUAIFENESIN 100 MG/5ML PO LIQD: 5.0000 mL | ORAL | 0 refills | Status: AC | PRN

## 2024-12-29 NOTE — Progress Notes (Signed)
 "  Virtual Visit via Video Note  I connected with [name] on 12/30/2024 at 10:40 AM EST by a video enabled telemedicine application and verified that I am speaking with the correct person using two identifiers.  Patient Location: Home Provider Location: Home Office  I discussed the limitations, risks, security, and privacy concerns of performing an evaluation and management service by video and the availability of in person appointments. I also discussed with the patient that there may be a patient responsible charge related to this service. The patient expressed understanding and agreed to proceed.  Subjective: PCP: Barbara Rollene BRAVO, MD  Chief Complaint  Patient presents with   URI    Cough , mucus present yellow and green, low grade fever    HPI  The patient was seen and treated in the emergency department on 11/26/2024 for acute bronchitis with bronchospasm and was prescribed prednisone . She reports that her symptoms have persisted since that visit, with minimal relief from the prednisone . Today, she presents with ongoing cough, production of yellow and green mucus, and low-grade fevers, with a maximum temperature of 100.27F.    ROS: Per HPI Current Medications[1]  Observations/Objective: There were no vitals filed for this visit. Physical Exam Patient is well-developed, well-nourished in no acute distress.  Resting comfortably at home.  Head is normocephalic, atraumatic.  No labored breathing.  Speech is clear and coherent with logical content.  Patient is alert and oriented at baseline.   Assessment and Plan: Bronchitis Assessment & Plan: Encouraged the patient to start and complete the full course of azithromycin . Prescribed guaifenesin  5 mL by mouth every 4 hours for cough and to help loosen phlegm. Encouraged increased hydration and use of a humidifier. Advised to follow up if symptoms worsen or fail to improve.   Orders: -     Azithromycin ; Take 2  tablets on day 1, then 1 tablet daily on days 2 through 5  Dispense: 6 tablet; Refill: 0 -     guaiFENesin ; Take 5 mLs by mouth every 4 (four) hours as needed for cough or to loosen phlegm.  Dispense: 120 mL; Refill: 0  Atelectasis Assessment & Plan: Chest X-ray reviewed. The patient is requesting a referral to pulmonology for further evaluation. Referral placed for pulmonary function testing (PFTs   Orders: -     Pulmonary Visit    Follow Up Instructions: No follow-ups on file.   I discussed the assessment and treatment plan with the patient. The patient was provided an opportunity to ask questions, and all were answered. The patient agreed with the plan and demonstrated an understanding of the instructions.   The patient was advised to call back or seek an in-person evaluation if the symptoms worsen or if the condition fails to improve as anticipated.  The above assessment and management plan was discussed with the patient. The patient verbalized understanding of and has agreed to the management plan.   Barbara JENEANE Gerlach, FNP     [1]  Current Outpatient Medications:    azithromycin  (ZITHROMAX ) 250 MG tablet, Take 2 tablets on day 1, then 1 tablet daily on days 2 through 5, Disp: 6 tablet, Rfl: 0   guaiFENesin  (ROBITUSSIN) 100 MG/5ML liquid, Take 5 mLs by mouth every 4 (four) hours as needed for cough or to loosen phlegm., Disp: 120 mL, Rfl: 0   acyclovir  ointment (ZOVIRAX ) 5 %, Apply to affected area(s) every 4 hours while awake for 5  days , then as needed (Patient  taking differently: Apply 1 Application topically every 4 (four) hours as needed (irritation).), Disp: 30 g, Rfl: 1   albuterol  (PROVENTIL ) (2.5 MG/3ML) 0.083% nebulizer solution, Take 2.5 mg by nebulization every 6 (six) hours as needed., Disp: , Rfl:    ALPRAZolam  (XANAX ) 1 MG tablet, Take 1 tablet (1 mg total) by mouth 2 (two) times daily., Disp: 60 tablet, Rfl: 2   aspirin  81 MG chewable tablet, Chew 81 mg by  mouth in the morning., Disp: , Rfl:    botulinum toxin Type A  (BOTOX ) 200 units injection, Inject 155 units IM into multiple site in the face,neck and head once every 90 days, Disp: 1 each, Rfl: 4   conjugated estrogens  (PREMARIN ) vaginal cream, INSERT pea sized amount three times weekly intravaginally (Patient taking differently: Place 1 applicator vaginally at bedtime.), Disp: 30 g, Rfl: 2   cyanocobalamin  (VITAMIN B12) 1000 MCG/ML injection, ADMINISTER 1 ML(1000 MCG) IN THE MUSCLE EVERY 30 DAYS, Disp: 1 mL, Rfl: 11   cyclobenzaprine  (FLEXERIL ) 10 MG tablet, Take 1 tablet (10 mg total) by mouth 2 (two) times daily as needed for muscle spasms. (Patient taking differently: Take 10 mg by mouth in the morning and at bedtime.), Disp: 60 tablet, Rfl: 5   cycloSPORINE  (RESTASIS ) 0.05 % ophthalmic emulsion, Place 1 drop into both eyes 2 (two) times daily as needed (dry eyes)., Disp: , Rfl:    DULoxetine  (CYMBALTA ) 60 MG capsule, TAKE 1 CAPSULE(60 MG) BY MOUTH DAILY, Disp: 30 capsule, Rfl: 3   ezetimibe  (ZETIA ) 10 MG tablet, TAKE 1 TABLET(10 MG) BY MOUTH DAILY, Disp: 90 tablet, Rfl: 3   fenofibrate  (TRICOR ) 48 MG tablet, TAKE 1 TABLET(48 MG) BY MOUTH DAILY (Patient taking differently: Take 48 mg by mouth at bedtime.), Disp: 30 tablet, Rfl: 5   HYDROmorphone  (DILAUDID ) 4 MG tablet, Take 4 mg by mouth 3 (three) times daily., Disp: , Rfl:    INTRAROSA 6.5 MG INST, Place 1 suppository vaginally 2 (two) times a week. Mondays & Fridays, Disp: , Rfl:    ipratropium-albuterol  (DUONEB) 0.5-2.5 (3) MG/3ML SOLN, INHALE CONTENTS OF 1 VIAL VIA NEBULIZER EVERY 6 HOURS AS NEEDED, Disp: 180 mL, Rfl: 0   levothyroxine  (SYNTHROID ) 125 MCG tablet, TAKE 1 TABLET(125 MCG) BY MOUTH DAILY BEFORE BREAKFAST, Disp: 90 tablet, Rfl: 1   lidocaine  (XYLOCAINE ) 2 % solution, Use as directed 15 mLs in the mouth or throat as needed for mouth pain., Disp: 100 mL, Rfl: 0   linaclotide  (LINZESS ) 290 MCG CAPS capsule, Take 1 capsule (290 mcg  total) by mouth daily before breakfast., Disp: 30 capsule, Rfl: 11   magnesium  oxide (MAG-OX) 400 (240 Mg) MG tablet, TAKE 1 TABLET BY MOUTH FOUR TIMES DAILY, Disp: 180 tablet, Rfl: 0   MegaRed Omega-3 Krill Oil 500 MG CAPS, Take 500 mg by mouth at bedtime., Disp: , Rfl:    metoCLOPramide  (REGLAN ) 10 MG tablet, Take 1 tablet (10 mg total) by mouth every 6 (six) hours. (Patient taking differently: Take 10 mg by mouth every 6 (six) hours as needed (gastroparesis symptoms).), Disp: 30 tablet, Rfl: 0   metoprolol  succinate (TOPROL -XL) 25 MG 24 hr tablet, Take 25 mg by mouth daily as needed (elevated blood pressure). Patient states takes this meds for her heart., Disp: , Rfl:    Multiple Vitamins-Minerals (ICAPS AREDS 2 PO), Take 1 capsule by mouth at bedtime., Disp: , Rfl:    naloxone (NARCAN) nasal spray 4 mg/0.1 mL, Place 1 spray into the nose as needed (opioid reversal)., Disp: ,  Rfl:    omeprazole  (PRILOSEC) 40 MG capsule, TAKE 1 CAPSULE(40 MG) BY MOUTH IN THE MORNING AND AT BEDTIME, Disp: 180 capsule, Rfl: 3   OVER THE COUNTER MEDICATION, Take 3 capsules by mouth in the morning. Nutrafol Hair Wellness supplement, Disp: , Rfl:    Polyethylene Glycol 400 (BLINK TEARS OP), Take 1 capsule by mouth at bedtime. Blink NutriTears Supplement for Dry Eyes, Disp: , Rfl:    potassium chloride  (KLOR-CON  M) 10 MEQ tablet, TAKE 1 TABLET(10 MEQ) BY MOUTH TWICE DAILY, Disp: 180 tablet, Rfl: 3   pravastatin  (PRAVACHOL ) 20 MG tablet, TAKE 1 TABLET(20 MG) BY MOUTH DAILY (Patient taking differently: Take 20 mg by mouth at bedtime.), Disp: 90 tablet, Rfl: 1   predniSONE  (DELTASONE ) 20 MG tablet, Take 20 mg by mouth 2 (two) times daily., Disp: , Rfl:    promethazine -dextromethorphan  (PROMETHAZINE -DM) 6.25-15 MG/5ML syrup, Take 5 mLs by mouth 3 (three) times daily as needed for cough., Disp: 118 mL, Rfl: 0   PROMETHEGAN 25 MG suppository, Place 25 mg rectally every 6 (six) hours as needed for nausea or vomiting., Disp: ,  Rfl:    Rimegepant Sulfate (NURTEC) 75 MG TBDP, Medication Samples have been provided to the patient.  Drug name: Nurtec     Strength: 75mg         Qty: 2 boxes  LOT: [id number]  Exp.Date: 05/2025  Dosing instructions:   The patient has been instructed regarding the correct time, dose, and frequency of taking this medication, including desired effects and most common side effects.   Mahina A Allen 3:04 PM 06/23/2024, Disp: , Rfl:    UNABLE TO FIND, Med Name: Nebulizer, Disp: 1 each, Rfl: 0   Vitamin D , Ergocalciferol , (DRISDOL ) 1.25 MG (50000 UNIT) CAPS capsule, Take 50,000 Units by mouth every 30 (thirty) days., Disp: , Rfl:    XARELTO  20 MG TABS tablet, TAKE 1 TABLET(20 MG) BY MOUTH DAILY, Disp: 30 tablet, Rfl: 5 No current facility-administered medications for this visit.  Facility-Administered Medications Ordered in Other Visits:    magnesium  sulfate IVPB 2 g 50 mL, 2 g, Intravenous, Once, Barbara Rollene BRAVO, MD  "

## 2024-12-30 DIAGNOSIS — J9811 Atelectasis: Secondary | ICD-10-CM | POA: Insufficient documentation

## 2024-12-30 DIAGNOSIS — J40 Bronchitis, not specified as acute or chronic: Secondary | ICD-10-CM | POA: Insufficient documentation

## 2024-12-30 NOTE — Assessment & Plan Note (Signed)
 Encouraged the patient to start and complete the full course of azithromycin . Prescribed guaifenesin  5 mL by mouth every 4 hours for cough and to help loosen phlegm. Encouraged increased hydration and use of a humidifier. Advised to follow up if symptoms worsen or fail to improve.

## 2024-12-30 NOTE — Assessment & Plan Note (Signed)
 Chest X-ray reviewed. The patient is requesting a referral to pulmonology for further evaluation. Referral placed for pulmonary function testing (PFTs

## 2025-01-04 ENCOUNTER — Inpatient Hospital Stay: Attending: Physician Assistant | Admitting: Licensed Clinical Social Worker

## 2025-01-04 ENCOUNTER — Other Ambulatory Visit: Payer: Self-pay | Admitting: Licensed Clinical Social Worker

## 2025-01-04 ENCOUNTER — Encounter: Payer: Self-pay | Admitting: Licensed Clinical Social Worker

## 2025-01-04 DIAGNOSIS — Z853 Personal history of malignant neoplasm of breast: Secondary | ICD-10-CM

## 2025-01-04 DIAGNOSIS — Z803 Family history of malignant neoplasm of breast: Secondary | ICD-10-CM

## 2025-01-04 DIAGNOSIS — Z1379 Encounter for other screening for genetic and chromosomal anomalies: Secondary | ICD-10-CM

## 2025-01-04 DIAGNOSIS — Z800 Family history of malignant neoplasm of digestive organs: Secondary | ICD-10-CM

## 2025-01-04 NOTE — Progress Notes (Signed)
 REFERRING PROVIDER: Lamon Pleasant HERO, PA-C [address] [place],  [place] [address]  PRIMARY PROVIDER:  Antonetta Rollene BRAVO, MD  PRIMARY REASON FOR VISIT:  1. Personal history of breast cancer   2. Family history of breast cancer   3. Family history of colon cancer   4. Family history of stomach cancer    I connected with Ms. Markman on 01/04/2025 at 9:10 AM EDT by telephone and verified that I am speaking with the correct person using three identifiers.    Patient location: home Provider location: [facility]  HISTORY OF PRESENT ILLNESS:   Ms. Cozart, a 64 y.o. female, was seen for a [facility] cancer genetics consultation at the request of Dr. Lamon due to a personal and family history of breast cancer.  Ms. Mirkin presents to clinic today to discuss the possibility of a hereditary predisposition to cancer, genetic testing, and to further clarify her future cancer risks, as well as potential cancer risks for family members.   CANCER HISTORY:  In 2006, at the age of 73, Ms. Kronick was diagnosed with Paget's disease of the left breast, treated with mastectomy.  Reported negative BRCA testing in 2006.   Past Medical History:  Diagnosis Date   Abdominal hernia 08/14/2019   Abdominal pain 06/06/2019   Acute bronchitis 05/01/2013   Acute cystitis 06/09/2010   Qualifier: Diagnosis of  By: Charlsie LPN, Brandi     Acute renal insufficiency 08/28/2011   Allergy    Phreesia 03/16/2021   Anemia    Phreesia 10/29/2020   Anxiety    Anxiety state 03/16/2008   Qualifier: Diagnosis of  By: Tita Lipps     Arthritis    Phreesia 10/29/2020   Back pain with radiation 07/17/2014   Bilateral hand pain 07/27/2016   Blood transfusion without reported diagnosis    Phreesia 10/29/2020   Breast cancer (HCC)    L breast- 2006   Cancer (HCC)    Phreesia 10/29/2020   Carpal tunnel syndrome    Chronic constipation    Chronic pain syndrome    followed by [facility]---   back   Clotting disorder    Phreesia 10/29/2020   Depression    Dermatitis 12/15/2011   Difficult intubation    Patient thinks something was mentioned in 2022 at [facility] with Botox  Injections.   Educated about COVID-19 virus infection 05/14/2019   Fall at home 01/26/2016   Family history of adverse reaction to anesthesia    MOTHER--- PONV   Fatigue 09/17/2012   Fibromyalgia    FIBROMYALGIA 03/16/2008   Qualifier: Diagnosis of  By: Tita Lipps     GERD (gastroesophageal reflux disease)    Headache disorder 01/16/2013   Headache syndrome 01/26/2016   Heart murmur 1996   Hip pain, right 07/08/2019   History of blood transfusion 08/2016   in [facility]   History of MRSA infection    lip abscess   History of ovarian cyst 06/2011   s/p  BSO   History of pulmonary embolus (PE) 1997   post EP with ablation pulmonary veouns for SVT/ Atrial Fib.; also on 09/22/22 in [facility]   History of supraventricular tachycardia    s/p  ablation 1996  and 1997  by dr fernande   History of TIA (transient ischemic attack) 1997   post op EP ablation PE   Hyperlipidemia    Hyperlipidemia LDL goal <100 03/16/2008   Qualifier: Diagnosis of  By: Bullins, Luann  Hypothyroidism    followed by pcp   Incisional hernia    Insomnia 12/15/2011   Intermittent palpitations 08/22/2017   Interstitial cystitis    09-13-2018   per pt last flare-up  May 2019 (followed by pcp)   INTERSTITIAL CYSTITIS 02/17/2011   Qualifier: Diagnosis of  By: Antonetta MD, Margaret     Iron  deficiency anemia    09-13-2018  PER PT STABLE   Irregular heart rate 07/25/2013   Lipoma of back    upper   Low back pain radiating to right leg 07/04/2019   Low ferritin level 06/14/2014   MDD (major depressive disorder), single episode, in full remission 10/27/2017   Medically noncompliant 02/28/2015   Multiple missed appointments, both follow-up appointments and lab appointments.    Metabolic syndrome X 02/10/2011   Qualifier: Diagnosis of   By: Antonetta MD, Margaret  hBA1c is 5.8 in 02/2013    Migraines    Morbid obesity (HCC) 03/27/2013   Muscle spasm 01/03/2020   Nausea alone 07/17/2014   NECK PAIN, CHRONIC 03/16/2008   Qualifier: Diagnosis of  By: Tita Lipps     Normal coronary arteries    a. by CT 12/2018.   Obesity 03/16/2008   Qualifier: Diagnosis of  By: Tita Lipps     Oral ulceration 08/23/2014   Presented at 06/14/2014 visit    Other malaise and fatigue 03/16/2008   Centricity Description: FATIGUE, CHRONIC Qualifier: Diagnosis of  By: Tita Lipps   Centricity Description: FATIGUE Qualifier: Diagnosis of  By: Windell PA, Dawn     OVARIAN CYST 12/26/2009   Qualifier: Diagnosis of  By: Antonetta MD, Margaret     Paget's disease of breast, left (HCC) 03/16/2008   Qualifier: Diagnosis of  By: Tita Lipps  Left diagnosed in 2006 F/h breast cancer x 15 family members   Palpitations    hx- appears to coincide with dehydration and electrolyte disturbances - see 08/01/24 note   Paroxysmal A-fib (HCC)    hx - appears to coincide with dehydration and electrolyte disturbances - see 08/01/24 note   Partial small bowel obstruction (HCC) 07/04/2012   Pneumonia    x 2   PONV (postoperative nausea and vomiting)    SEVERE   Postsurgical menopause 11/13/2011   Presence of IVC filter 06/06/2019   PSVT (paroxysmal supraventricular tachycardia)    HX ABLATION 1996 AND 1997   Recurrent oral herpes simplex infection 10/13/2017   ROM (right otitis media) 01/23/2013   S/P insertion of IVC (inferior vena caval) filter 05/08/2005   [place] (non-retrievable)  /  dx 2019  a leg of filter is protruding thru the vena cava in to right L2 vertebral body (09-13-2018  per pt having surgery to remove filter in [place])   S/P radiofrequency ablation operation for arrhythmia 1996   1996  and 1997,   SVT and Atrial Fib   Sinusitis, chronic 01/26/2016   SMALL BOWEL OBSTRUCTION, HX OF 08/07/2008   Annotation: obstruction w/ adhesions led  to partial colectomy Qualifier: Diagnosis of  By: Georgina Palma     Stroke [facility])    Phreesia 10/29/2020, TIAs in the past -1989   Swelling of hand 09/17/2012   Syncope    Tachycardia 02/03/2010   Qualifier: Diagnosis of  By: Via LPN, Lynn     Thyroid  disease    Phreesia 10/29/2020   TOS (thoracic outlet syndrome)    right side   Ulcer 12/15/2011   Urinary frequency 07/18/2014   Vaginitis and vulvovaginitis 05/10/2013  Vitamin D  deficiency 05/05/2015   Wears glasses     Past Surgical History:  Procedure Laterality Date   ABDOMINAL HYSTERECTOMY  1987   ANTERIOR CERVICAL DECOMP/DISCECTOMY FUSION  03-07-2002   dr elsner  @[facility]    C 4 -- 5   APPENDECTOMY  1980   AUGMENTATION MAMMAPLASTY Right 2006   BILATERAL SALPINGOOPHORECTOMY  07/24/2011   via Explor. Lap. w/ intraoperative perf. bowel repair   BIOPSY  05/02/2021   Procedure: BIOPSY;  Surgeon: Eartha Flavors, Toribio, MD;  [facility];  Service: Gastroenterology;;  small bowel, mid esophagus, distal esophagus, random colon biopsies   BIOPSY  12/04/2021   Procedure: BIOPSY;  Surgeon: Cindie Carlin POUR, DO;  [facility];  Service: Endoscopy;;   BIOPSY  03/31/2022   Procedure: BIOPSY;  Surgeon: Eartha Flavors Toribio, MD;  [facility];  Service: Gastroenterology;;   BOTOX  INJECTION N/A 03/31/2022   Procedure: BOTOX  INJECTION;  Surgeon: Eartha Flavors Toribio, MD;  [facility];  Service: Gastroenterology;  Laterality: N/A;   BREAST BIOPSY Right 2019   benign   BREAST ENHANCEMENT SURGERY Bilateral 1993   BREAST IMPLANT REMOVAL Bilateral    BREAST SURGERY N/A    Phreesia 10/29/2020   CARDIAC CATHETERIZATION  07-06-2003   dr wolm brodie   normal coronaries and LVF   CARDIAC ELECTROPHYSIOLOGY STUDY AND ABLATION  1996  and 1997   CARDIOVASCULAR STRESS TEST  11-18-2015   dr fernande   normal nuclear study w/ no ischemia/  normal LV function and wall motion , ef 84%   CARPAL  TUNNEL RELEASE Right ?   CESAREAN SECTION  1986   CESAREAN SECTION N/A    Phreesia 10/29/2020   CHOLECYSTECTOMY N/A    Phreesia 10/29/2020   COLON SURGERY     COLONOSCOPY WITH PROPOFOL  N/A 05/02/2021   Procedure: COLONOSCOPY WITH PROPOFOL ;  Surgeon: Eartha Flavors Toribio, MD;  [facility];  Service: Gastroenterology;  Laterality: N/A;  12:30 PM   COSMETIC SURGERY  2006   After breast cancer   CYSTO/  HYDRODISTENTION/  INSTILATION THERAPY  MULTIPLE   ENTEROCUTANEOUS FISTULA CLOSURE  multiple   last one 2015 with small bowel resection   ESOPHAGOGASTRODUODENOSCOPY (EGD) WITH PROPOFOL  N/A 05/02/2021   Procedure: ESOPHAGOGASTRODUODENOSCOPY (EGD) WITH PROPOFOL ;  Surgeon: Eartha Flavors Toribio, MD;  [facility];  Service: Gastroenterology;  Laterality: N/A;   ESOPHAGOGASTRODUODENOSCOPY (EGD) WITH PROPOFOL  N/A 12/04/2021   Procedure: ESOPHAGOGASTRODUODENOSCOPY (EGD) WITH PROPOFOL ;  Surgeon: Cindie Carlin POUR, DO;  [facility];  Service: Endoscopy;  Laterality: N/A;   ESOPHAGOGASTRODUODENOSCOPY (EGD) WITH PROPOFOL  N/A 03/31/2022   Procedure: ESOPHAGOGASTRODUODENOSCOPY (EGD) WITH PROPOFOL ;  Surgeon: Eartha Flavors Toribio, MD;  [facility];  Service: Gastroenterology;  Laterality: N/A;  1015   EXPLORATORY LAPAROTOMY INCISIONAL VENTRAL HERNIA REPAIR / RESECTION SMALL BOWEL  11-09-2014   @[facility]    FRACTURE SURGERY N/A    Phreesia 10/29/2020   HERNIA REPAIR  2012-2015   Failed   JOINT REPLACEMENT N/A    Phreesia 10/29/2020   LIPOMA EXCISION Right 09/16/2018   Procedure: EXCISION LIPOMA UPPER BACK;  Surgeon: Signe Mitzie LABOR, MD;  Location: [facility];  Service: General;  Laterality: Right;   MASTECTOMY Left 2006   w/ reconstruction on left (paget's disease)  and right breast augmentation   PERIPHERAL VASCULAR BALLOON ANGIOPLASTY Right 09/13/2023   Procedure: PERIPHERAL VASCULAR BALLOON ANGIOPLASTY;  Surgeon: Sheree Penne Bruckner, MD;  Location: [facility] INVASIVE CV LAB;  Service: Cardiovascular;  Laterality: Right;  Subclavian and axillary   POLYPECTOMY  05/02/2021   Procedure: POLYPECTOMY;  Surgeon: Eartha Angelia Sieving, MD;  [facility];  Service: Gastroenterology;;  gastric   POLYPECTOMY  03/31/2022   Procedure: POLYPECTOMY;  Surgeon: Eartha Angelia Sieving, MD;  [facility];  Service: Gastroenterology;;   RIB RESECTION Right 08/15/2024   Procedure: EXCISION, RIB;  Surgeon: Sheree Penne Bruckner, MD;  Location[facility];  Service: Vascular;  Laterality: Right;   SAVORY DILATION  05/02/2021   Procedure: HARLEY DILATION;  Surgeon: Eartha Angelia Sieving, MD;  [facility];  Service: Gastroenterology;;   SMALL INTESTINE SURGERY N/A    Phreesia 10/29/2020   SPINE SURGERY N/A    Phreesia 10/29/2020   TOTAL COLECTOMY  08-04-2002    @[facility]    AND CHOLECYSTECTOMY  (colonic inertia)   TRANSTHORACIC ECHOCARDIOGRAM  02/04/2016   ef 60-65%,  grade 2 diastolic dysfunction/  mild MR   TUBAL LIGATION N/A    Phreesia 03/16/2021   UPPER EXTREMITY VENOGRAPHY N/A 09/13/2023   Procedure: UPPER EXTREMITY VENOGRAPHY;  Surgeon: Sheree Penne Bruckner, MD;  Location: [facility] INVASIVE CV LAB;  Service: Cardiovascular;  Laterality: N/A;   VENA CAVA FILTER PLACEMENT  05/08/2005   @[facility]    [place] (non-retrievable)   VENOGRAM N/A 08/15/2024   Procedure: VENOGRAM;  Surgeon: Sheree Penne Bruckner, MD;  Location: [facility] OR;  Service: Vascular;  Laterality: N/A;  Possible Venogram   WIDE EXCISION PERIRECTAL ABSCESSES  09-22-2005   @ [facility]    FAMILY HISTORY:  We obtained a detailed, 4-generation family history.  Significant diagnoses are listed below: Family History  Problem Relation Age of Onset   Stroke Mother    Migraines Mother    Congestive Heart Failure Mother    Diabetes Mother    Heart disease Mother    Hypertension Mother    Other Mother        ketoacidosis   Arthritis  Mother    Asthma Mother    COPD Mother    Kidney disease Mother    Miscarriages / Stillbirths Mother    Vision loss Mother    Heart disease Father    Hyperlipidemia Father    Hypertension Father    Alcohol abuse Father    Congestive Heart Failure Father    Breast cancer Maternal Aunt        d. 30s   Breast cancer Maternal Aunt    Congestive Heart Failure Maternal Aunt    Breast cancer Maternal Aunt        d. 50s   Rheum arthritis Maternal Aunt    Colon cancer Maternal Aunt    Stomach cancer Maternal Aunt    Colon cancer Maternal Aunt    Stomach cancer Maternal Aunt    Cancer Maternal Uncle        mets   Prostate cancer Maternal Uncle    Migraines Maternal Grandmother    Anemia Maternal Grandmother    Bone cancer Maternal Grandfather        mets   Clotting disorder Maternal Grandfather    Asthma Paternal Grandmother    Migraines Daughter    Supraventricular tachycardia Daughter    Breast cancer Cousin        at least 5 mat cousins w/ breast   Breast cancer Paternal Great-grandmother     3 maternal aunts with history of breast cancer, 1 died in her 30s and 1 died in her 52s 2 maternal aunts with colon/stomach  cancer 1 maternal uncle with prostate cancer 2 maternal uncles with unknown types of cancer/metastatic Maternal grandfather with metastatic/bone cancer Maternal cousins x5 (or more) with breast cancer, one dx at 80 Paternal great grandmother and her two sisters with breast cancer  Ms. Brodt is unaware of previous family history of genetic testing for hereditary cancer risks. There is no reported Ashkenazi Jewish ancestry. There is no known consanguinity.  GENETIC COUNSELING ASSESSMENT: Ms. Silbernagel is a 64 y.o. female with a personal and family history of breast cancer which is somewhat suggestive of a hereditary cancer syndrome and predisposition to cancer. We, therefore, discussed and recommended the following at today's visit.   DISCUSSION: We discussed that,  in general, most cancer is not inherited in families, but instead is sporadic or familial. We discussed that approximately 10% of breast cancer is hereditary. Most cases of hereditary breast cancer are associated with BRCA1/BRCA2 genes, although there are other genes associated with hereditary cancer as well. Cancers and risks are gene specific. We discussed that testing is beneficial for several reasons including knowing about cancer risks, identifying potential screening and risk-reduction options that may be appropriate, and to understand if other family members could be at risk for cancer and allow them to undergo genetic testing.   We reviewed the characteristics, features and inheritance patterns of hereditary cancer syndromes. We also discussed genetic testing, including the appropriate family members to test, the process of testing, insurance coverage and turn-around-time for results. We discussed the implications of a negative, positive and/or variant of uncertain significant result. We recommended Ms. Reliford pursue genetic testing for the [facility] Common Hereditary Cancers+RNA gene panel.   The Common Hereditary Cancers Panel + RNA offered by [facility] includes sequencing and/or deletion duplication testing of the following 48 genes: APC*, ATM*, AXIN2, BAP1, BARD1, BMPR1A, BRCA1, BRCA2, BRIP1, CDH1, CDK4, CDKN2A (p14ARF), CDKN2A (p16INK4a), CHEK2, CTNNA1, DICER1*, EPCAM*, FH*, GREM1*, HOXB13, KIT, MBD4, MEN1*, MLH1*, MSH2*, MSH3*, MSH6*, MUTYH, NF1*, NTHL1, PALB2, PDGFRA, PMS2*, POLD1*, POLE, PTEN*, RAD51C, RAD51D, SDHA*, SDHB, SDHC*, SDHD, SMAD4, SMARCA4, STK11, TP53, TSC1*, TSC2, VHL.   Based on Ms. Mikolajczak's personal and family history of cancer, she meets medical criteria for genetic testing. Despite that she meets criteria, she may still have an out of pocket cost.  PLAN: After considering the risks, benefits, and limitations, Ms. Mitnick provided informed consent to pursue genetic testing. She  will have her blood drawn 1/29 and the blood sample will be sent to [facility] for analysis of the Common Hereditary Cancers+RNA Panel. Results should be available within approximately 2-3 weeks' time, at which point they will be disclosed by telephone to Ms. Boss, as will any additional recommendations warranted by these results. Ms. Scharnhorst will receive a summary of her genetic counseling visit and a copy of her results once available. This information will also be available in Epic.   Ms. Hyslop questions were answered to her satisfaction today. Our contact information was provided should additional questions or concerns arise. Thank you for the referral and allowing us  to share in the care of your patient.   Dena Cary, MS, [facility] Genetic Counselor [email or web address] Phone: [phone]  I personally spent a total of 42 minutes in the care of the patient today including getting/reviewing separately obtained history, counseling and educating, placing orders, and documenting clinical information in the EHR.  Dr. Delinda was available for discussion regarding this case.   _______________________________________________________________________ For Office Staff:  Number of people involved in session: 1 Was an Intern/ student  involved with case: no

## 2025-01-06 ENCOUNTER — Other Ambulatory Visit: Payer: Self-pay | Admitting: Family Medicine

## 2025-01-09 ENCOUNTER — Ambulatory Visit: Payer: Self-pay

## 2025-01-12 ENCOUNTER — Ambulatory Visit: Admitting: Neurology

## 2025-01-15 ENCOUNTER — Telehealth: Payer: Self-pay | Admitting: Orthopedic Surgery

## 2025-01-15 DIAGNOSIS — M170 Bilateral primary osteoarthritis of knee: Secondary | ICD-10-CM

## 2025-01-15 DIAGNOSIS — G8929 Other chronic pain: Secondary | ICD-10-CM

## 2025-01-15 NOTE — Telephone Encounter (Signed)
 Dr. Areatha pt - pt lvm stating she wants to get gel injections again, bil knees.  She's not been in to see Dr. Margrette since 01/03/2024. Does she an appointment first or can he put in a order for April to get approval?  [phone]

## 2025-01-16 NOTE — Telephone Encounter (Signed)
 Lm to advise We will check which brand of Hyaluronic acid injections (there are several) your insurance covers. We will call you with price and schedule with you if insurance approves and you are okay with the out of pocket costs. If for any reason they will not cover or the out of pocket costs are high, we will discuss with you and let you know other options available. This process normally takes several weeks to hear back from us  the insurance approval process takes time.

## 2025-01-16 NOTE — Telephone Encounter (Signed)
 I have called the pt and lvm for her to call and schedule an appointment, advised that ins will require a visit prior to submitting for gel injection approval.

## 2025-01-16 NOTE — Telephone Encounter (Signed)
 Patient would need an updated visit from last injection.  [employer] will not approve for the gel injection without that visit.

## 2025-01-17 ENCOUNTER — Encounter: Payer: Self-pay | Admitting: Family Medicine

## 2025-01-17 NOTE — Telephone Encounter (Signed)
 Your welcome.

## 2025-01-18 ENCOUNTER — Ambulatory Visit: Payer: Self-pay | Admitting: Family Medicine

## 2025-01-18 ENCOUNTER — Other Ambulatory Visit: Payer: Self-pay

## 2025-01-18 DIAGNOSIS — E039 Hypothyroidism, unspecified: Secondary | ICD-10-CM

## 2025-01-18 DIAGNOSIS — D649 Anemia, unspecified: Secondary | ICD-10-CM

## 2025-01-18 DIAGNOSIS — E8881 Metabolic syndrome: Secondary | ICD-10-CM

## 2025-01-18 DIAGNOSIS — D509 Iron deficiency anemia, unspecified: Secondary | ICD-10-CM

## 2025-01-18 DIAGNOSIS — E782 Mixed hyperlipidemia: Secondary | ICD-10-CM

## 2025-01-18 DIAGNOSIS — E559 Vitamin D deficiency, unspecified: Secondary | ICD-10-CM

## 2025-01-18 MED ORDER — LINACLOTIDE 290 MCG PO CAPS: 290.0000 ug | ORAL_CAPSULE | Freq: Every day | ORAL | 5 refills | Status: AC

## 2025-01-18 NOTE — Telephone Encounter (Signed)
 FYI Only or Action Required?: Action required by provider: request for appointment and medication refill request.  Patient was last seen in primary care on 12/29/2024 by Edman Meade PEDLAR, FNP.  Called Nurse Triage reporting Fatigue.  Symptoms began a week ago.  Interventions attempted: Nothing.  Symptoms are: gradually worsening.  Triage Disposition: See Physician Within 24 Hours  Patient/caregiver understands and will follow disposition?: Yes                  Message from Victoria B sent at 01/18/2025 11:09 AM EST  Reason for Triage: feels very fatigued   Reason for Disposition  [1] MODERATE weakness (e.g., interferes with work, school, normal activities) AND [2] persists > 3 days  Answer Assessment - Initial Assessment Questions 1. DESCRIPTION: Describe how you are feeling.     Feeling exhausted, don't want to get up and walk. Just want to get from one room to another.  2. SEVERITY: How bad is it?  Can you stand and walk?     Mild to moderate. Able to stand and walk.  3. ONSET: When did these symptoms begin? (e.g., hours, days, weeks, months)     The past week.  4. CAUSE: What do you think is causing the weakness or fatigue? (e.g., not drinking enough fluids, medical problem, trouble sleeping)     Ferritin and iron  levels get low; wants electrolytes/Magnesium /Potassium checked.  5. NEW MEDICINES:  Have you started on any new medicines recently? (e.g., opioid pain medicines, benzodiazepines, muscle relaxants, antidepressants, antihistamines, neuroleptics, beta blockers)     No.  6. OTHER SYMPTOMS: Do you have any other symptoms? (e.g., chest pain, fever, cough, SOB, vomiting, diarrhea, bleeding, other areas of pain)     No fainting/syncope, chest pain, palpitations, black or tarry bowel movements, bleeding, vomiting, fever. SOB with walking x 3 months (about the same not severe or worsening).  Protocols used: Weakness (Generalized) and  Fatigue-A-AH

## 2025-01-24 ENCOUNTER — Other Ambulatory Visit: Payer: Self-pay | Admitting: Family Medicine

## 2025-01-24 ENCOUNTER — Other Ambulatory Visit: Payer: Self-pay | Admitting: *Deleted

## 2025-01-24 ENCOUNTER — Telehealth: Payer: Self-pay | Admitting: *Deleted

## 2025-01-24 DIAGNOSIS — D500 Iron deficiency anemia secondary to blood loss (chronic): Secondary | ICD-10-CM

## 2025-01-24 DIAGNOSIS — E038 Other specified hypothyroidism: Secondary | ICD-10-CM

## 2025-01-24 NOTE — Telephone Encounter (Signed)
 Patient called to advise that she is having symptoms of IDA, to include cracked lips and fatigue.  Will add iron  studies and cbcd to labs on 01/29/25.  Pleasant Barefoot, PA-C notified.

## 2025-01-25 ENCOUNTER — Inpatient Hospital Stay

## 2025-01-25 ENCOUNTER — Telehealth: Payer: Self-pay | Admitting: Pharmacy Technician

## 2025-01-25 ENCOUNTER — Other Ambulatory Visit (HOSPITAL_COMMUNITY): Payer: Self-pay

## 2025-01-25 NOTE — Telephone Encounter (Signed)
 Pharmacy Patient Advocate Encounter   Received notification from Onbase CMM KEY that prior authorization for Acyclovir  5% Ointment  is required/requested.   Insurance verification completed.   The patient is insured through [employer].   Per test claim: PA required; PA submitted to above mentioned insurance via Latent Key/confirmation #/EOC [id number] Status is pending

## 2025-01-28 ENCOUNTER — Encounter: Payer: Self-pay | Admitting: *Deleted

## 2025-01-29 ENCOUNTER — Ambulatory Visit: Admitting: Internal Medicine

## 2025-01-29 ENCOUNTER — Inpatient Hospital Stay

## 2025-01-29 NOTE — Telephone Encounter (Signed)
 Pharmacy Patient Advocate Encounter  Received notification from [facility] that Prior Authorization for Acyclovir  5% ointment has been DENIED.  No reason given; No denial letter received via Fax or CMM. It has been requested and will be uploaded to the media tab once received.   PA #/Case ID/Reference #: [id number]

## 2025-01-30 NOTE — Telephone Encounter (Signed)
 Pharmacy Patient Advocate Encounter  DENIED.  Full denial letter will be uploaded to the media tab. See denial reason below.  Due to not being prescribed in accordance with an RDA labeled use or use accepted by the Medicare approved drug compendia.

## 2025-01-31 NOTE — Telephone Encounter (Signed)
 noted

## 2025-02-01 ENCOUNTER — Inpatient Hospital Stay

## 2025-02-08 ENCOUNTER — Ambulatory Visit: Payer: Self-pay | Admitting: Family Medicine

## 2025-02-12 ENCOUNTER — Inpatient Hospital Stay

## 2025-02-27 ENCOUNTER — Ambulatory Visit: Admitting: Internal Medicine

## 2025-03-02 ENCOUNTER — Ambulatory Visit: Admitting: Family Medicine

## 2025-05-30 ENCOUNTER — Inpatient Hospital Stay

## 2025-06-06 ENCOUNTER — Inpatient Hospital Stay: Admitting: Physician Assistant
